# Patient Record
Sex: Female | Born: 1960 | Hispanic: No | Marital: Single
Health system: Southern US, Academic
[De-identification: ages and names within clinical notes are randomized; demographics above are authoritative.]

## PROBLEM LIST (undated history)

## (undated) ENCOUNTER — Ambulatory Visit

## (undated) ENCOUNTER — Encounter

## (undated) ENCOUNTER — Ambulatory Visit: Payer: MEDICARE

## (undated) ENCOUNTER — Encounter
Attending: Student in an Organized Health Care Education/Training Program | Primary: Student in an Organized Health Care Education/Training Program

## (undated) ENCOUNTER — Telehealth

## (undated) ENCOUNTER — Ambulatory Visit: Payer: Medicare (Managed Care)

## (undated) ENCOUNTER — Ambulatory Visit: Attending: Pharmacist | Primary: Pharmacist

## (undated) ENCOUNTER — Encounter
Payer: MEDICARE | Attending: Student in an Organized Health Care Education/Training Program | Primary: Student in an Organized Health Care Education/Training Program

## (undated) DIAGNOSIS — D869 Sarcoidosis, unspecified: Secondary | ICD-10-CM

## (undated) DIAGNOSIS — G4730 Sleep apnea, unspecified: Secondary | ICD-10-CM

## (undated) DIAGNOSIS — M797 Fibromyalgia: Secondary | ICD-10-CM

## (undated) DIAGNOSIS — E538 Deficiency of other specified B group vitamins: Secondary | ICD-10-CM

## (undated) DIAGNOSIS — M1990 Unspecified osteoarthritis, unspecified site: Secondary | ICD-10-CM

## (undated) DIAGNOSIS — L439 Lichen planus, unspecified: Secondary | ICD-10-CM

## (undated) DIAGNOSIS — K219 Gastro-esophageal reflux disease without esophagitis: Secondary | ICD-10-CM

## (undated) DIAGNOSIS — T4145XA Adverse effect of unspecified anesthetic, initial encounter: Secondary | ICD-10-CM

## (undated) DIAGNOSIS — G4733 Obstructive sleep apnea (adult) (pediatric): Secondary | ICD-10-CM

## (undated) DIAGNOSIS — R519 Headache, unspecified: Secondary | ICD-10-CM

## (undated) DIAGNOSIS — J309 Allergic rhinitis, unspecified: Secondary | ICD-10-CM

## (undated) DIAGNOSIS — T8859XA Other complications of anesthesia, initial encounter: Secondary | ICD-10-CM

## (undated) DIAGNOSIS — G459 Transient cerebral ischemic attack, unspecified: Secondary | ICD-10-CM

## (undated) DIAGNOSIS — E46 Unspecified protein-calorie malnutrition: Secondary | ICD-10-CM

## (undated) DIAGNOSIS — R410 Disorientation, unspecified: Secondary | ICD-10-CM

## (undated) DIAGNOSIS — J189 Pneumonia, unspecified organism: Secondary | ICD-10-CM

## (undated) DIAGNOSIS — G4700 Insomnia, unspecified: Secondary | ICD-10-CM

## (undated) DIAGNOSIS — F411 Generalized anxiety disorder: Secondary | ICD-10-CM

## (undated) DIAGNOSIS — M25562 Pain in left knee: Secondary | ICD-10-CM

## (undated) DIAGNOSIS — R51 Headache: Secondary | ICD-10-CM

## (undated) DIAGNOSIS — M069 Rheumatoid arthritis, unspecified: Secondary | ICD-10-CM

## (undated) DIAGNOSIS — M2550 Pain in unspecified joint: Secondary | ICD-10-CM

## (undated) DIAGNOSIS — R2681 Unsteadiness on feet: Secondary | ICD-10-CM

## (undated) DIAGNOSIS — F329 Major depressive disorder, single episode, unspecified: Secondary | ICD-10-CM

## (undated) DIAGNOSIS — F410 Panic disorder [episodic paroxysmal anxiety] without agoraphobia: Secondary | ICD-10-CM

## (undated) DIAGNOSIS — Z9889 Other specified postprocedural states: Secondary | ICD-10-CM

## (undated) DIAGNOSIS — F419 Anxiety disorder, unspecified: Secondary | ICD-10-CM

## (undated) DIAGNOSIS — Z8619 Personal history of other infectious and parasitic diseases: Secondary | ICD-10-CM

## (undated) DIAGNOSIS — I10 Essential (primary) hypertension: Secondary | ICD-10-CM

## (undated) DIAGNOSIS — M2540 Effusion, unspecified joint: Secondary | ICD-10-CM

## (undated) DIAGNOSIS — R0602 Shortness of breath: Secondary | ICD-10-CM

## (undated) DIAGNOSIS — F32A Depression, unspecified: Secondary | ICD-10-CM

## (undated) DIAGNOSIS — R3915 Urgency of urination: Secondary | ICD-10-CM

## (undated) DIAGNOSIS — E559 Vitamin D deficiency, unspecified: Secondary | ICD-10-CM

## (undated) DIAGNOSIS — R413 Other amnesia: Secondary | ICD-10-CM

## (undated) DIAGNOSIS — I639 Cerebral infarction, unspecified: Secondary | ICD-10-CM

## (undated) DIAGNOSIS — E119 Type 2 diabetes mellitus without complications: Secondary | ICD-10-CM

## (undated) DIAGNOSIS — R112 Nausea with vomiting, unspecified: Secondary | ICD-10-CM

## (undated) DIAGNOSIS — R351 Nocturia: Secondary | ICD-10-CM

## (undated) HISTORY — DX: Sarcoidosis, unspecified: D86.9

## (undated) HISTORY — DX: Gastro-esophageal reflux disease without esophagitis: K21.9

## (undated) HISTORY — DX: Essential (primary) hypertension: I10

## (undated) HISTORY — DX: Major depressive disorder, single episode, unspecified: F32.9

## (undated) HISTORY — DX: Pain in left knee: M25.562

## (undated) HISTORY — DX: Unsteadiness on feet: R26.81

## (undated) HISTORY — DX: Anxiety disorder, unspecified: F41.9

## (undated) HISTORY — DX: Generalized anxiety disorder: F41.1

## (undated) HISTORY — DX: Deficiency of other specified B group vitamins: E53.8

## (undated) HISTORY — PX: ENDOMETRIAL ABLATION: SHX621

## (undated) HISTORY — DX: Vitamin D deficiency, unspecified: E55.9

## (undated) HISTORY — DX: Obstructive sleep apnea (adult) (pediatric): G47.33

## (undated) HISTORY — DX: Unspecified osteoarthritis, unspecified site: M19.90

## (undated) HISTORY — DX: Unspecified protein-calorie malnutrition: E46

## (undated) HISTORY — PX: OTHER SURGICAL HISTORY: SHX169

## (undated) HISTORY — DX: Shortness of breath: R06.02

## (undated) HISTORY — PX: APPENDECTOMY: SHX54

## (undated) HISTORY — DX: Allergic rhinitis, unspecified: J30.9

## (undated) HISTORY — PX: AXILLARY ABCESS IRRIGATION AND DEBRIDEMENT: SHX1210

## (undated) HISTORY — DX: Depression, unspecified: F32.A

## (undated) HISTORY — DX: Sleep apnea, unspecified: G47.30

## (undated) SURGERY — COLONOSCOPY
Anesthesia: Monitor Anesthesia Care

## (undated) SURGERY — DACRYOCYSTORHINOSTOMY
Anesthesia: General | Laterality: Bilateral

---
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
## 2002-05-17 ENCOUNTER — Encounter: Payer: Self-pay | Admitting: Internal Medicine

## 2002-05-17 ENCOUNTER — Ambulatory Visit (HOSPITAL_COMMUNITY): Admission: RE | Admit: 2002-05-17 | Discharge: 2002-05-17 | Payer: Self-pay | Admitting: Internal Medicine

## 2003-04-04 ENCOUNTER — Ambulatory Visit (HOSPITAL_COMMUNITY): Admission: RE | Admit: 2003-04-04 | Discharge: 2003-04-04 | Payer: Self-pay | Admitting: Internal Medicine

## 2003-05-05 ENCOUNTER — Ambulatory Visit (HOSPITAL_COMMUNITY): Admission: RE | Admit: 2003-05-05 | Discharge: 2003-05-05 | Payer: Self-pay | Admitting: Internal Medicine

## 2003-09-22 ENCOUNTER — Ambulatory Visit: Payer: Self-pay | Admitting: *Deleted

## 2003-10-07 ENCOUNTER — Ambulatory Visit: Payer: Self-pay | Admitting: Internal Medicine

## 2003-10-11 ENCOUNTER — Ambulatory Visit (HOSPITAL_COMMUNITY): Admission: RE | Admit: 2003-10-11 | Discharge: 2003-10-11 | Payer: Self-pay | Admitting: Internal Medicine

## 2003-10-12 ENCOUNTER — Ambulatory Visit: Payer: Self-pay | Admitting: Internal Medicine

## 2003-11-04 ENCOUNTER — Ambulatory Visit: Payer: Self-pay | Admitting: Internal Medicine

## 2003-11-18 ENCOUNTER — Ambulatory Visit: Payer: Self-pay | Admitting: Internal Medicine

## 2003-12-14 ENCOUNTER — Ambulatory Visit: Payer: Self-pay | Admitting: Internal Medicine

## 2004-01-27 ENCOUNTER — Ambulatory Visit: Payer: Self-pay | Admitting: Internal Medicine

## 2004-02-04 ENCOUNTER — Ambulatory Visit (HOSPITAL_COMMUNITY): Admission: RE | Admit: 2004-02-04 | Discharge: 2004-02-04 | Payer: Self-pay | Admitting: Internal Medicine

## 2004-02-07 ENCOUNTER — Ambulatory Visit: Payer: Self-pay | Admitting: Family Medicine

## 2004-02-16 ENCOUNTER — Ambulatory Visit: Payer: Self-pay | Admitting: Internal Medicine

## 2004-03-19 ENCOUNTER — Ambulatory Visit: Payer: Self-pay | Admitting: Internal Medicine

## 2004-03-26 ENCOUNTER — Ambulatory Visit: Payer: Self-pay | Admitting: Internal Medicine

## 2004-03-26 ENCOUNTER — Ambulatory Visit (HOSPITAL_COMMUNITY): Admission: RE | Admit: 2004-03-26 | Discharge: 2004-03-26 | Payer: Self-pay | Admitting: Internal Medicine

## 2004-04-09 ENCOUNTER — Ambulatory Visit: Payer: Self-pay | Admitting: Family Medicine

## 2004-04-11 ENCOUNTER — Ambulatory Visit: Payer: Self-pay | Admitting: Internal Medicine

## 2004-04-13 ENCOUNTER — Ambulatory Visit: Payer: Self-pay | Admitting: Internal Medicine

## 2004-05-07 ENCOUNTER — Ambulatory Visit: Payer: Self-pay | Admitting: Internal Medicine

## 2004-07-04 ENCOUNTER — Ambulatory Visit: Payer: Self-pay | Admitting: Internal Medicine

## 2004-09-05 ENCOUNTER — Ambulatory Visit: Payer: Self-pay | Admitting: Internal Medicine

## 2004-10-10 ENCOUNTER — Ambulatory Visit: Payer: Self-pay | Admitting: Internal Medicine

## 2004-10-12 ENCOUNTER — Ambulatory Visit (HOSPITAL_COMMUNITY): Admission: RE | Admit: 2004-10-12 | Discharge: 2004-10-12 | Payer: Self-pay | Admitting: Internal Medicine

## 2004-10-23 ENCOUNTER — Ambulatory Visit: Payer: Self-pay | Admitting: Internal Medicine

## 2004-11-07 ENCOUNTER — Ambulatory Visit: Payer: Self-pay | Admitting: Internal Medicine

## 2004-11-12 ENCOUNTER — Ambulatory Visit: Payer: Self-pay | Admitting: Internal Medicine

## 2004-11-12 HISTORY — PX: OTHER SURGICAL HISTORY: SHX169

## 2004-11-22 ENCOUNTER — Ambulatory Visit: Payer: Self-pay | Admitting: Pulmonary Disease

## 2004-11-28 ENCOUNTER — Ambulatory Visit: Payer: Self-pay | Admitting: Pulmonary Disease

## 2004-11-28 ENCOUNTER — Ambulatory Visit: Admission: RE | Admit: 2004-11-28 | Discharge: 2004-11-28 | Payer: Self-pay | Admitting: Pulmonary Disease

## 2004-12-16 ENCOUNTER — Ambulatory Visit (HOSPITAL_BASED_OUTPATIENT_CLINIC_OR_DEPARTMENT_OTHER): Admission: RE | Admit: 2004-12-16 | Discharge: 2004-12-16 | Payer: Self-pay | Admitting: Pulmonary Disease

## 2004-12-24 ENCOUNTER — Ambulatory Visit: Payer: Self-pay | Admitting: Internal Medicine

## 2005-01-01 ENCOUNTER — Ambulatory Visit: Payer: Self-pay | Admitting: Pulmonary Disease

## 2005-02-20 ENCOUNTER — Ambulatory Visit: Payer: Self-pay | Admitting: Pulmonary Disease

## 2005-03-07 ENCOUNTER — Ambulatory Visit: Payer: Self-pay | Admitting: Pulmonary Disease

## 2005-03-25 ENCOUNTER — Ambulatory Visit: Payer: Self-pay | Admitting: Internal Medicine

## 2005-04-04 ENCOUNTER — Encounter: Admission: RE | Admit: 2005-04-04 | Discharge: 2005-04-04 | Payer: Self-pay | Admitting: Internal Medicine

## 2005-05-24 ENCOUNTER — Ambulatory Visit: Payer: Self-pay | Admitting: Internal Medicine

## 2005-07-05 ENCOUNTER — Emergency Department (HOSPITAL_COMMUNITY): Admission: EM | Admit: 2005-07-05 | Discharge: 2005-07-05 | Payer: Self-pay | Admitting: Emergency Medicine

## 2005-07-05 ENCOUNTER — Ambulatory Visit: Payer: Self-pay | Admitting: Internal Medicine

## 2005-07-11 ENCOUNTER — Ambulatory Visit: Payer: Self-pay | Admitting: Internal Medicine

## 2005-07-24 ENCOUNTER — Ambulatory Visit: Payer: Self-pay | Admitting: Internal Medicine

## 2005-07-26 ENCOUNTER — Ambulatory Visit: Payer: Self-pay | Admitting: Internal Medicine

## 2005-09-20 ENCOUNTER — Ambulatory Visit: Payer: Self-pay | Admitting: Pulmonary Disease

## 2005-10-10 ENCOUNTER — Ambulatory Visit: Payer: Self-pay | Admitting: Internal Medicine

## 2005-10-17 ENCOUNTER — Ambulatory Visit: Payer: Self-pay | Admitting: Professional

## 2005-10-31 ENCOUNTER — Ambulatory Visit: Payer: Self-pay | Admitting: Professional

## 2005-11-11 ENCOUNTER — Ambulatory Visit: Payer: Self-pay | Admitting: Internal Medicine

## 2005-11-18 ENCOUNTER — Ambulatory Visit: Payer: Self-pay | Admitting: Professional

## 2005-12-09 ENCOUNTER — Ambulatory Visit: Payer: Self-pay | Admitting: Professional

## 2005-12-13 ENCOUNTER — Ambulatory Visit: Payer: Self-pay | Admitting: Internal Medicine

## 2005-12-23 ENCOUNTER — Ambulatory Visit (HOSPITAL_COMMUNITY): Admission: RE | Admit: 2005-12-23 | Discharge: 2005-12-23 | Payer: Self-pay | Admitting: Obstetrics & Gynecology

## 2006-03-14 ENCOUNTER — Emergency Department (HOSPITAL_COMMUNITY): Admission: EM | Admit: 2006-03-14 | Discharge: 2006-03-14 | Payer: Self-pay | Admitting: Emergency Medicine

## 2006-03-17 ENCOUNTER — Ambulatory Visit: Payer: Self-pay | Admitting: Internal Medicine

## 2006-03-20 ENCOUNTER — Ambulatory Visit: Payer: Self-pay | Admitting: Professional

## 2006-03-24 ENCOUNTER — Ambulatory Visit: Payer: Self-pay | Admitting: Professional

## 2006-03-31 ENCOUNTER — Ambulatory Visit: Payer: Self-pay | Admitting: Professional

## 2006-04-10 ENCOUNTER — Ambulatory Visit: Payer: Self-pay | Admitting: Professional

## 2006-05-01 ENCOUNTER — Ambulatory Visit: Payer: Self-pay | Admitting: Professional

## 2006-05-02 ENCOUNTER — Ambulatory Visit: Payer: Self-pay | Admitting: Internal Medicine

## 2006-05-02 LAB — CONVERTED CEMR LAB
Basophils Absolute: 0 10*3/uL (ref 0.0–0.1)
Chloride: 104 meq/L (ref 96–112)
Eosinophils Absolute: 0.1 10*3/uL (ref 0.0–0.6)
GFR calc Af Amer: 77 mL/min
GFR calc non Af Amer: 64 mL/min
Glucose, Bld: 105 mg/dL — ABNORMAL HIGH (ref 70–99)
Hemoglobin, Urine: NEGATIVE
Ketones, ur: NEGATIVE mg/dL
MCHC: 35.4 g/dL (ref 30.0–36.0)
MCV: 93.5 fL (ref 78.0–100.0)
Monocytes Relative: 7.5 % (ref 3.0–11.0)
Nitrite: NEGATIVE
Platelets: 260 10*3/uL (ref 150–400)
RBC: 4.6 M/uL (ref 3.87–5.11)
RDW: 12.7 % (ref 11.5–14.6)
Sodium: 145 meq/L (ref 135–145)
Specific Gravity, Urine: 1.025 (ref 1.000–1.03)
TSH: 1.81 microintl units/mL (ref 0.35–5.50)
Total Protein, Urine: 30 mg/dL — AB
pH: 6.5 (ref 5.0–8.0)

## 2006-05-27 ENCOUNTER — Ambulatory Visit: Payer: Self-pay | Admitting: Internal Medicine

## 2006-05-27 LAB — CONVERTED CEMR LAB
Hemoglobin, Urine: NEGATIVE
Urine Glucose: NEGATIVE mg/dL
pH: 6 (ref 5.0–8.0)

## 2006-06-05 ENCOUNTER — Ambulatory Visit (HOSPITAL_COMMUNITY): Admission: RE | Admit: 2006-06-05 | Discharge: 2006-06-05 | Payer: Self-pay | Admitting: Orthopedic Surgery

## 2006-08-06 ENCOUNTER — Ambulatory Visit: Payer: Self-pay | Admitting: Internal Medicine

## 2006-08-06 LAB — CONVERTED CEMR LAB
CO2: 29 meq/L (ref 19–32)
Chloride: 102 meq/L (ref 96–112)
Creatinine, Ser: 0.8 mg/dL (ref 0.4–1.2)
Glucose, Bld: 114 mg/dL — ABNORMAL HIGH (ref 70–99)
Potassium: 4.4 meq/L (ref 3.5–5.1)
Sodium: 139 meq/L (ref 135–145)

## 2006-08-14 DIAGNOSIS — K219 Gastro-esophageal reflux disease without esophagitis: Secondary | ICD-10-CM | POA: Insufficient documentation

## 2006-08-14 DIAGNOSIS — F411 Generalized anxiety disorder: Secondary | ICD-10-CM

## 2006-08-14 DIAGNOSIS — F339 Major depressive disorder, recurrent, unspecified: Secondary | ICD-10-CM | POA: Insufficient documentation

## 2006-08-14 DIAGNOSIS — D869 Sarcoidosis, unspecified: Secondary | ICD-10-CM | POA: Insufficient documentation

## 2006-08-14 HISTORY — DX: Generalized anxiety disorder: F41.1

## 2006-08-22 ENCOUNTER — Ambulatory Visit: Payer: Self-pay | Admitting: Internal Medicine

## 2006-09-11 ENCOUNTER — Ambulatory Visit: Payer: Self-pay | Admitting: Pulmonary Disease

## 2006-09-11 LAB — CONVERTED CEMR LAB
Angiotensin 1 Converting Enzyme: 31 units/L (ref 9–67)
BUN: 15 mg/dL (ref 6–23)

## 2006-09-17 ENCOUNTER — Ambulatory Visit: Payer: Self-pay | Admitting: Internal Medicine

## 2006-09-17 ENCOUNTER — Encounter: Payer: Self-pay | Admitting: Pulmonary Disease

## 2006-09-19 ENCOUNTER — Ambulatory Visit: Admission: RE | Admit: 2006-09-19 | Discharge: 2006-09-19 | Payer: Self-pay | Admitting: Pulmonary Disease

## 2006-09-19 ENCOUNTER — Encounter: Payer: Self-pay | Admitting: Pulmonary Disease

## 2006-10-07 ENCOUNTER — Ambulatory Visit: Payer: Self-pay | Admitting: Pulmonary Disease

## 2006-10-16 ENCOUNTER — Encounter: Admission: RE | Admit: 2006-10-16 | Discharge: 2006-10-16 | Payer: Self-pay | Admitting: Internal Medicine

## 2006-10-22 ENCOUNTER — Ambulatory Visit: Payer: Self-pay | Admitting: Pulmonary Disease

## 2006-10-22 LAB — CONVERTED CEMR LAB
Basophils Absolute: 0 10*3/uL (ref 0.0–0.1)
Basophils Relative: 0.4 % (ref 0.0–1.0)
HCT: 39.3 % (ref 36.0–46.0)
INR: 0.8 (ref 0.8–1.0)
MCHC: 35.2 g/dL (ref 30.0–36.0)
Neutrophils Relative %: 84.5 % — ABNORMAL HIGH (ref 43.0–77.0)
Prothrombin Time: 10.5 s — ABNORMAL LOW (ref 10.9–13.3)
RBC: 4.1 M/uL (ref 3.87–5.11)
RDW: 12.7 % (ref 11.5–14.6)
WBC: 10.9 10*3/uL — ABNORMAL HIGH (ref 4.5–10.5)

## 2006-10-29 ENCOUNTER — Encounter: Payer: Self-pay | Admitting: Pulmonary Disease

## 2006-10-29 ENCOUNTER — Ambulatory Visit: Payer: Self-pay

## 2006-11-05 ENCOUNTER — Ambulatory Visit: Payer: Self-pay | Admitting: Pulmonary Disease

## 2006-12-03 ENCOUNTER — Ambulatory Visit: Payer: Self-pay | Admitting: Pulmonary Disease

## 2006-12-04 ENCOUNTER — Encounter: Payer: Self-pay | Admitting: Internal Medicine

## 2007-01-14 ENCOUNTER — Ambulatory Visit: Payer: Self-pay | Admitting: Pulmonary Disease

## 2007-01-21 ENCOUNTER — Encounter: Payer: Self-pay | Admitting: Internal Medicine

## 2007-02-02 ENCOUNTER — Ambulatory Visit: Payer: Self-pay | Admitting: Internal Medicine

## 2007-02-02 DIAGNOSIS — M1990 Unspecified osteoarthritis, unspecified site: Secondary | ICD-10-CM | POA: Insufficient documentation

## 2007-02-02 DIAGNOSIS — J019 Acute sinusitis, unspecified: Secondary | ICD-10-CM | POA: Insufficient documentation

## 2007-02-04 ENCOUNTER — Emergency Department (HOSPITAL_COMMUNITY): Admission: EM | Admit: 2007-02-04 | Discharge: 2007-02-04 | Payer: Self-pay | Admitting: Emergency Medicine

## 2007-02-06 ENCOUNTER — Telehealth: Payer: Self-pay | Admitting: Internal Medicine

## 2007-02-07 ENCOUNTER — Ambulatory Visit: Payer: Self-pay | Admitting: Internal Medicine

## 2007-02-07 ENCOUNTER — Inpatient Hospital Stay (HOSPITAL_COMMUNITY): Admission: EM | Admit: 2007-02-07 | Discharge: 2007-02-13 | Payer: Self-pay | Admitting: Emergency Medicine

## 2007-02-07 ENCOUNTER — Ambulatory Visit: Payer: Self-pay | Admitting: Family Medicine

## 2007-02-07 ENCOUNTER — Encounter (INDEPENDENT_AMBULATORY_CARE_PROVIDER_SITE_OTHER): Payer: Self-pay | Admitting: Surgery

## 2007-02-07 DIAGNOSIS — R1031 Right lower quadrant pain: Secondary | ICD-10-CM

## 2007-02-07 DIAGNOSIS — N390 Urinary tract infection, site not specified: Secondary | ICD-10-CM | POA: Insufficient documentation

## 2007-02-10 ENCOUNTER — Encounter: Payer: Self-pay | Admitting: Internal Medicine

## 2007-02-18 ENCOUNTER — Encounter: Payer: Self-pay | Admitting: Internal Medicine

## 2007-03-24 ENCOUNTER — Ambulatory Visit: Payer: Self-pay | Admitting: Pulmonary Disease

## 2007-04-07 ENCOUNTER — Encounter: Payer: Self-pay | Admitting: Internal Medicine

## 2007-04-28 ENCOUNTER — Ambulatory Visit: Payer: Self-pay | Admitting: Internal Medicine

## 2007-04-28 LAB — CONVERTED CEMR LAB
ALT: 25 units/L (ref 0–35)
AST: 19 units/L (ref 0–37)
Albumin: 3 g/dL — ABNORMAL LOW (ref 3.5–5.2)
BUN: 14 mg/dL (ref 6–23)
Basophils Relative: 0.1 % (ref 0.0–1.0)
CO2: 31 meq/L (ref 19–32)
Chloride: 104 meq/L (ref 96–112)
Creatinine, Ser: 0.9 mg/dL (ref 0.4–1.2)
Eosinophils Relative: 0.5 % (ref 0.0–5.0)
Hemoglobin: 12.5 g/dL (ref 12.0–15.0)
Hgb A1c MFr Bld: 5.9 % (ref 4.6–6.0)
Lymphocytes Relative: 16 % (ref 12.0–46.0)
MCV: 94.8 fL (ref 78.0–100.0)
Neutrophils Relative %: 79.4 % — ABNORMAL HIGH (ref 43.0–77.0)
RBC: 4.09 M/uL (ref 3.87–5.11)
Total Protein: 6.4 g/dL (ref 6.0–8.3)
WBC: 6.3 10*3/uL (ref 4.5–10.5)

## 2007-05-05 ENCOUNTER — Ambulatory Visit: Payer: Self-pay | Admitting: Internal Medicine

## 2007-05-11 ENCOUNTER — Encounter: Payer: Self-pay | Admitting: Internal Medicine

## 2007-06-01 ENCOUNTER — Encounter: Payer: Self-pay | Admitting: Pulmonary Disease

## 2007-06-15 ENCOUNTER — Ambulatory Visit: Payer: Self-pay | Admitting: Internal Medicine

## 2007-07-01 ENCOUNTER — Telehealth: Payer: Self-pay | Admitting: Internal Medicine

## 2007-07-02 ENCOUNTER — Ambulatory Visit: Payer: Self-pay | Admitting: Internal Medicine

## 2007-07-02 ENCOUNTER — Telehealth: Payer: Self-pay | Admitting: Internal Medicine

## 2007-07-02 ENCOUNTER — Encounter: Payer: Self-pay | Admitting: Internal Medicine

## 2007-07-02 DIAGNOSIS — H5310 Unspecified subjective visual disturbances: Secondary | ICD-10-CM | POA: Insufficient documentation

## 2007-07-06 ENCOUNTER — Telehealth: Payer: Self-pay | Admitting: Internal Medicine

## 2007-07-10 ENCOUNTER — Ambulatory Visit: Payer: Self-pay | Admitting: Internal Medicine

## 2007-07-10 DIAGNOSIS — R030 Elevated blood-pressure reading, without diagnosis of hypertension: Secondary | ICD-10-CM | POA: Insufficient documentation

## 2007-07-16 ENCOUNTER — Encounter: Payer: Self-pay | Admitting: Internal Medicine

## 2007-08-05 ENCOUNTER — Encounter: Payer: Self-pay | Admitting: Internal Medicine

## 2007-08-13 ENCOUNTER — Encounter: Payer: Self-pay | Admitting: Internal Medicine

## 2007-08-19 ENCOUNTER — Ambulatory Visit: Payer: Self-pay | Admitting: Internal Medicine

## 2007-08-20 LAB — CONVERTED CEMR LAB
Alkaline Phosphatase: 68 units/L (ref 39–117)
Bilirubin, Direct: 0.1 mg/dL (ref 0.0–0.3)
CO2: 30 meq/L (ref 19–32)
GFR calc Af Amer: 99 mL/min
Glucose, Bld: 101 mg/dL — ABNORMAL HIGH (ref 70–99)
Lymphocytes Relative: 22.6 % (ref 12.0–46.0)
Monocytes Absolute: 0.6 10*3/uL (ref 0.1–1.0)
Monocytes Relative: 7.8 % (ref 3.0–12.0)
Platelets: 216 10*3/uL (ref 150–400)
Potassium: 3.6 meq/L (ref 3.5–5.1)
RDW: 13.5 % (ref 11.5–14.6)
Sodium: 140 meq/L (ref 135–145)
Total Protein: 6 g/dL (ref 6.0–8.3)
Vitamin B-12: 216 pg/mL (ref 211–911)

## 2007-08-25 ENCOUNTER — Ambulatory Visit: Payer: Self-pay | Admitting: Internal Medicine

## 2007-08-25 DIAGNOSIS — E538 Deficiency of other specified B group vitamins: Secondary | ICD-10-CM

## 2007-08-25 DIAGNOSIS — E559 Vitamin D deficiency, unspecified: Secondary | ICD-10-CM

## 2007-08-25 HISTORY — DX: Vitamin D deficiency, unspecified: E55.9

## 2007-08-25 HISTORY — DX: Deficiency of other specified B group vitamins: E53.8

## 2007-08-27 DIAGNOSIS — I10 Essential (primary) hypertension: Secondary | ICD-10-CM

## 2007-09-25 ENCOUNTER — Telehealth: Payer: Self-pay | Admitting: Internal Medicine

## 2007-10-14 ENCOUNTER — Ambulatory Visit: Payer: Self-pay | Admitting: Internal Medicine

## 2007-10-26 ENCOUNTER — Encounter: Payer: Self-pay | Admitting: Internal Medicine

## 2007-10-28 ENCOUNTER — Encounter: Payer: Self-pay | Admitting: Internal Medicine

## 2007-11-16 ENCOUNTER — Ambulatory Visit: Payer: Self-pay | Admitting: Internal Medicine

## 2007-11-27 ENCOUNTER — Telehealth: Payer: Self-pay | Admitting: Internal Medicine

## 2007-12-24 ENCOUNTER — Telehealth: Payer: Self-pay | Admitting: Internal Medicine

## 2007-12-24 ENCOUNTER — Ambulatory Visit: Payer: Self-pay | Admitting: Internal Medicine

## 2007-12-25 LAB — CONVERTED CEMR LAB
ALT: 20 units/L (ref 0–35)
AST: 16 units/L (ref 0–37)
BUN: 14 mg/dL (ref 6–23)
CO2: 31 meq/L (ref 19–32)
Chloride: 102 meq/L (ref 96–112)
Creatinine, Ser: 0.9 mg/dL (ref 0.4–1.2)
Glucose, Bld: 123 mg/dL — ABNORMAL HIGH (ref 70–99)
Total Bilirubin: 0.6 mg/dL (ref 0.3–1.2)
Total Protein: 6.5 g/dL (ref 6.0–8.3)
Vitamin B-12: >1500 pg/mL — ABNORMAL HIGH (ref 211–911)

## 2008-01-18 ENCOUNTER — Telehealth: Payer: Self-pay | Admitting: Internal Medicine

## 2008-03-04 ENCOUNTER — Ambulatory Visit: Payer: Self-pay | Admitting: Internal Medicine

## 2008-06-03 ENCOUNTER — Telehealth: Payer: Self-pay | Admitting: Internal Medicine

## 2008-06-29 ENCOUNTER — Ambulatory Visit: Payer: Self-pay | Admitting: Internal Medicine

## 2008-06-29 LAB — CONVERTED CEMR LAB
ALT: 20 units/L (ref 0–35)
AST: 19 units/L (ref 0–37)
Albumin: 3.1 g/dL — ABNORMAL LOW (ref 3.5–5.2)
BUN: 12 mg/dL (ref 6–23)
Cholesterol: 193 mg/dL (ref 0–200)
Creatinine, Ser: 0.9 mg/dL (ref 0.4–1.2)
GFR calc non Af Amer: 71.05 mL/min (ref >60)
Glucose, Bld: 94 mg/dL (ref 70–99)
Hgb A1c MFr Bld: 5.6 % (ref 4.6–6.5)
Microalb Creat Ratio: 1.3 mg/g (ref 0.0–30.0)
Microalb, Ur: 0.4 mg/dL (ref 0.0–1.9)
Potassium: 3.7 meq/L (ref 3.5–5.1)
Vit D, 25-Hydroxy: 30 ng/mL (ref 30–89)

## 2008-07-05 ENCOUNTER — Ambulatory Visit: Payer: Self-pay | Admitting: Internal Medicine

## 2008-09-28 ENCOUNTER — Ambulatory Visit: Payer: Self-pay | Admitting: Internal Medicine

## 2008-09-28 LAB — CONVERTED CEMR LAB
Calcium: 8.9 mg/dL (ref 8.4–10.5)
Creatinine, Ser: 0.9 mg/dL (ref 0.4–1.2)
GFR calc non Af Amer: 70.98 mL/min (ref >60)

## 2008-10-05 ENCOUNTER — Ambulatory Visit: Payer: Self-pay | Admitting: Internal Medicine

## 2008-10-05 DIAGNOSIS — J320 Chronic maxillary sinusitis: Secondary | ICD-10-CM

## 2008-12-26 ENCOUNTER — Ambulatory Visit: Payer: Self-pay | Admitting: Internal Medicine

## 2008-12-27 LAB — CONVERTED CEMR LAB
AST: 26 units/L (ref 0–37)
Alkaline Phosphatase: 63 units/L (ref 39–117)
Basophils Absolute: 0 10*3/uL (ref 0.0–0.1)
GFR calc non Af Amer: 56.25 mL/min (ref >60)
HCT: 42 % (ref 36.0–46.0)
Hemoglobin: 14.3 g/dL (ref 12.0–15.0)
Lymphs Abs: 0.6 10*3/uL — ABNORMAL LOW (ref 0.7–4.0)
MCHC: 34 g/dL (ref 30.0–36.0)
Monocytes Relative: 0.8 % — ABNORMAL LOW (ref 3.0–12.0)
Neutro Abs: 11.2 10*3/uL — ABNORMAL HIGH (ref 1.4–7.7)
Potassium: 3.7 meq/L (ref 3.5–5.1)
RDW: 12 % (ref 11.5–14.6)
Sodium: 140 meq/L (ref 135–145)
TSH: 1.06 microintl units/mL (ref 0.35–5.50)
Total Bilirubin: 0.6 mg/dL (ref 0.3–1.2)

## 2008-12-30 ENCOUNTER — Ambulatory Visit: Payer: Self-pay | Admitting: Internal Medicine

## 2009-03-07 ENCOUNTER — Emergency Department (HOSPITAL_COMMUNITY): Admission: EM | Admit: 2009-03-07 | Discharge: 2009-03-07 | Payer: Self-pay | Admitting: Emergency Medicine

## 2009-03-30 ENCOUNTER — Telehealth: Payer: Self-pay | Admitting: Internal Medicine

## 2009-04-24 ENCOUNTER — Ambulatory Visit: Payer: Self-pay | Admitting: Internal Medicine

## 2009-04-24 LAB — CONVERTED CEMR LAB
ALT: 38 units/L — ABNORMAL HIGH (ref 0–35)
Albumin: 3.1 g/dL — ABNORMAL LOW (ref 3.5–5.2)
BUN: 16 mg/dL (ref 6–23)
Chloride: 100 meq/L (ref 96–112)
Creatinine, Ser: 1.1 mg/dL (ref 0.4–1.2)
Glucose, Bld: 101 mg/dL — ABNORMAL HIGH (ref 70–99)
Hgb A1c MFr Bld: 5.8 % (ref 4.6–6.5)
Total Bilirubin: 0.6 mg/dL (ref 0.3–1.2)
Total Protein: 6.8 g/dL (ref 6.0–8.3)
Vitamin B-12: 1345 pg/mL — ABNORMAL HIGH (ref 211–911)

## 2009-04-28 ENCOUNTER — Ambulatory Visit: Payer: Self-pay | Admitting: Internal Medicine

## 2009-04-28 DIAGNOSIS — R0602 Shortness of breath: Secondary | ICD-10-CM

## 2009-04-28 HISTORY — DX: Shortness of breath: R06.02

## 2009-05-01 ENCOUNTER — Encounter: Payer: Self-pay | Admitting: Internal Medicine

## 2009-05-03 ENCOUNTER — Encounter: Payer: Self-pay | Admitting: Internal Medicine

## 2009-05-03 ENCOUNTER — Telehealth: Payer: Self-pay | Admitting: Internal Medicine

## 2009-06-07 ENCOUNTER — Ambulatory Visit: Payer: Self-pay | Admitting: Internal Medicine

## 2009-07-03 ENCOUNTER — Telehealth: Payer: Self-pay | Admitting: Internal Medicine

## 2009-07-04 ENCOUNTER — Encounter: Payer: Self-pay | Admitting: Internal Medicine

## 2009-07-04 ENCOUNTER — Telehealth: Payer: Self-pay | Admitting: Internal Medicine

## 2009-07-20 ENCOUNTER — Telehealth (INDEPENDENT_AMBULATORY_CARE_PROVIDER_SITE_OTHER): Payer: Self-pay | Admitting: *Deleted

## 2009-07-26 ENCOUNTER — Telehealth: Payer: Self-pay | Admitting: Internal Medicine

## 2009-08-01 ENCOUNTER — Ambulatory Visit: Payer: Self-pay | Admitting: Internal Medicine

## 2009-08-01 ENCOUNTER — Encounter: Payer: Self-pay | Admitting: Internal Medicine

## 2009-08-01 DIAGNOSIS — F4321 Adjustment disorder with depressed mood: Secondary | ICD-10-CM | POA: Insufficient documentation

## 2009-08-14 LAB — CONVERTED CEMR LAB
AST: 24 units/L (ref 0–37)
Alkaline Phosphatase: 50 units/L (ref 39–117)
Calcium: 8.7 mg/dL (ref 8.4–10.5)
Eosinophils Absolute: 0 10*3/uL (ref 0.0–0.7)
GFR calc non Af Amer: 59.19 mL/min (ref >60)
Ketones, ur: NEGATIVE mg/dL
Leukocytes, UA: NEGATIVE
MCHC: 34.1 g/dL (ref 30.0–36.0)
MCV: 95.8 fL (ref 78.0–100.0)
Monocytes Absolute: 0.2 10*3/uL (ref 0.1–1.0)
Neutrophils Relative %: 88.4 % — ABNORMAL HIGH (ref 43.0–77.0)
Platelets: 247 10*3/uL (ref 150.0–400.0)
Potassium: 4.1 meq/L (ref 3.5–5.1)
RDW: 13.8 % (ref 11.5–14.6)
Sodium: 139 meq/L (ref 135–145)
Specific Gravity, Urine: 1.015 (ref 1.000–1.030)
TSH: 0.48 microintl units/mL (ref 0.35–5.50)
Total Bilirubin: 0.6 mg/dL (ref 0.3–1.2)
Urine Glucose: NEGATIVE mg/dL
pH: 8.5 (ref 5.0–8.0)

## 2009-08-21 ENCOUNTER — Encounter: Payer: Self-pay | Admitting: Internal Medicine

## 2009-08-28 ENCOUNTER — Telehealth: Payer: Self-pay | Admitting: Internal Medicine

## 2009-08-29 ENCOUNTER — Telehealth: Payer: Self-pay | Admitting: Internal Medicine

## 2009-09-04 ENCOUNTER — Encounter: Payer: Self-pay | Admitting: Internal Medicine

## 2009-10-13 ENCOUNTER — Telehealth: Payer: Self-pay | Admitting: Internal Medicine

## 2009-10-26 ENCOUNTER — Ambulatory Visit: Payer: Self-pay | Admitting: Internal Medicine

## 2009-10-26 DIAGNOSIS — J309 Allergic rhinitis, unspecified: Secondary | ICD-10-CM

## 2009-10-26 HISTORY — DX: Allergic rhinitis, unspecified: J30.9

## 2009-10-30 ENCOUNTER — Telehealth: Payer: Self-pay | Admitting: Internal Medicine

## 2009-11-24 ENCOUNTER — Telehealth: Payer: Self-pay | Admitting: Internal Medicine

## 2010-02-04 ENCOUNTER — Encounter: Payer: Self-pay | Admitting: Internal Medicine

## 2010-02-13 NOTE — Assessment & Plan Note (Signed)
Summary: 3 MO ROV /NWS  # flu shot   Vital Signs:  Patient profile:   50 year old female Height:      67 inches Weight:      298 pounds BMI:     46.84 Temp:     97.9 degrees F oral Pulse rate:   72 / minute Pulse rhythm:   regular Resp:     16 per minute BP sitting:   108 / 82  (left arm) Cuff size:   large  Vitals Entered By: Lanier Prude, CMA(AAMA) (October 26, 2009 11:06 AM) CC: 3 mo f/u Is Patient Diabetic? No   Primary Care Provider:  Plotnikov  CC:  3 mo f/u.  History of Present Illness: The patient presents for a follow up of sarcoid, allergies, GERD   Current Medications (verified): 1)  Celexa 40 Mg  Tabs (Citalopram Hydrobromide) .... Take 1.5  Tablet By Mouth Once A Day 2)  Coreg 12.5 Mg  Tabs (Carvedilol) .... Bid 3)  Buspar 10 Mg Tabs (Buspirone Hcl) .[name].. 1 By Mouth Two Times A Day 4)  Klor-Con 10 10 Meq Tbcr (Potassium Chloride) .... Take 1 Tab By Mouth Daily 5)  Allegra-D 12 Hour 60-120 Mg  Tb12 (Fexofenadine-Pseudoephedrine) .... Take 1 Two Times A Day 6)  Metoclopramide Hcl 10 Mg Tabs (Metoclopramide Hcl) .[name].. 1 By Mouth Three Times A Day Ac 7)  Prednisone 10 Mg  Tabs (Prednisone) .[name].. 1-2  By Mouth Once Daily As Dirrected 8)  Lorazepam 1 Mg  Tabs (Lorazepam) .[name].. 1 By Mouth Two Times A Day As Needed Anxiety or Insomnia 9)  Vitamin B-12 Cr 1000 Mcg  Tbcr (Cyanocobalamin) .... Take One Half Tablet By Mouth Daily 10)  Vitamin D3 1000 Unit  Tabs (Cholecalciferol) .... 2 By Mouth Daily 11)  Triamcinolone Acetonide 0.5 % Crea (Triamcinolone Acetonide) .... Apply Bid To Affected Area 12)  Fexofenadine Hcl 180 Mg Tabs (Fexofenadine Hcl) .[name].. 1 Once Daily As Needed Allergies 13)  Nexium 40 Mg Cpdr (Esomeprazole Magnesium) .... Take 1 Tab Each Morning 14)  Fish Oil 1000 Mg Caps (Omega-3 Fatty Acids) .... Once Daily 15)  Hydrocodone-Acetaminophen 10-325 Mg Tabs (Hydrocodone-Acetaminophen) .[name].. 1 By Mouth Up To 4 Times A Day By Mouth 16)  Aspirin 81 Mg Tbec  (Aspirin) .[name].. 1 By Mouth Qd  Allergies (verified): 1)  Enalapril Maleate 2)  Hydrochlorothiazide 3)  Plaquenil (Hydroxychloroquine Sulfate)  Past History:  Social History: Last updated: 08/25/2007 Occupation: disabled Single, broke up with her partner in 2008 Never Smoked  Past Medical History: 1. Abdominal pain (R low quad) 2. UTI  3. Family Hx of CAD (female) 4. Sinusitis 5. Osteoarthritis 6. Sarcoidosis Dr Wilford Grist 7. GERD  8. Depression 9. Anxiety  10. * Endometrial ablation 11. Vit D def 2009 12. Vit B12 def 2009 13. Ruptured appendix 1/09 14. Hypertension 15. OSA Allergic rhinitis  Review of Systems       The patient complains of dyspnea on exertion and depression.  The patient denies fever, abdominal pain, melena, and hematochezia.    Physical Exam  General:  NAD, overweight-appearing.   Nose:  Erythematous throat mucosa and intranasal erythema.  Mouth:  . Lungs:  Normal respiratory effort, chest expands symmetrically. Lungs are clear to auscultation, no crackles or wheezes. Heart:  Normal rate and regular rhythm. S1 and S2 normal without gallop, murmur, click, rub or other extra sounds. Abdomen:  Bowel sounds positive,abdomen soft and non-tender without masses, organomegaly or hernias noted. Msk:  Lumbar-sacral  spine is not  tender to palpation over paraspinal muscles and less painfull with the ROM  Extremities:  No edema B Neurologic:  No cranial nerve deficits noted. Station and gait are normal. Plantar reflexes are down-going bilaterally. DTRs are symmetrical throughout. Sensory, motor and coordinative functions appear intact. Skin:  Sacoid on face and L ear is  purple, not worse Psych:  normally interactive, good eye contact, and depressed affect.  tearful.     Impression & Recommendations:  Problem # 1:  HYPERTENSION (ICD-401.9) Assessment Unchanged  Her updated medication list for this problem includes:    Coreg 12.5 Mg Tabs (Carvedilol) .....  Bid  Problem # 2:  SARCOIDOSIS (ICD-135) Assessment: Unchanged  Problem # 3:  B12 DEFICIENCY (ICD-266.2)  Problem # 4:  ALLERGIC RHINITIS (ICD-477.9)  Her updated medication list for this problem includes:    Fexofenadine Hcl 180 Mg Tabs (Fexofenadine hcl) .[name]... 1 once daily as needed allergies Assessment: New  Complete Medication List: 1)  Celexa 40 Mg Tabs (Citalopram hydrobromide) .... Take 1.5  tablet by mouth once a day 2)  Coreg 12.5 Mg Tabs (Carvedilol) .... Bid 3)  Buspar 10 Mg Tabs (Buspirone hcl) .[name].. 1 by mouth two times a day 4)  Klor-con 10 10 Meq Tbcr (Potassium chloride) .... Take 1 tab by mouth daily 5)  Allegra-d 12 Hour 60-120 Mg Tb12 (Fexofenadine-pseudoephedrine) .... Take 1 two times a day 6)  Metoclopramide Hcl 10 Mg Tabs (Metoclopramide hcl) .[name].. 1 by mouth three times a day ac 7)  Prednisone 10 Mg Tabs (Prednisone) .[name].. 1-2  by mouth once daily as dirrected 8)  Lorazepam 1 Mg Tabs (Lorazepam) .[name].. 1 by mouth two times a day as needed anxiety or insomnia 9)  Vitamin B-12 Cr 1000 Mcg Tbcr (Cyanocobalamin) .... Take one half tablet by mouth daily 10)  Vitamin D3 1000 Unit Tabs (Cholecalciferol) .... 2 by mouth daily 11)  Triamcinolone Acetonide 0.5 % Crea (Triamcinolone acetonide) .... Apply bid to affected area 12)  Fexofenadine Hcl 180 Mg Tabs (Fexofenadine hcl) .[name].. 1 once daily as needed allergies 13)  Nexium 40 Mg Cpdr (Esomeprazole magnesium) .... Take 1 tab each morning 14)  Fish Oil 1000 Mg Caps (Omega-3 fatty acids) .... Once daily 15)  Hydrocodone-acetaminophen 10-325 Mg Tabs (Hydrocodone-acetaminophen) .[name].. 1 by mouth up to 4 times a day by mouth 16)  Aspirin 81 Mg Tbec (Aspirin) .[name].. 1 by mouth qd  Other Orders: Flu Vaccine 95yrs + MEDICARE PATIENTS (U0454) Administration Flu vaccine - MCR (U9811)  Patient Instructions: 1)  Please schedule a follow-up appointment in 4 months well w/labs. Prescriptions: FEXOFENADINE HCL 180 MG TABS (FEXOFENADINE  HCL) 1 once daily as needed allergies  #90.0 Each x 3   Entered and Authorized by:   Tresa Garter MD   Signed by:   Tresa Garter MD on 10/26/2009   Method used:   Print then Give to Patient   RxID:   [id number] METOCLOPRAMIDE HCL 10 MG TABS (METOCLOPRAMIDE HCL) 1 by mouth three times a day ac  #270 x 3   Entered and Authorized by:   Tresa Garter MD   Signed by:   Tresa Garter MD on 10/26/2009   Method used:   Print then Give to Patient   RxID:   [id number]    .[email or web address]   Flu Vaccine Consent Questions     Do you have a history of severe allergic reactions to this vaccine? no    Any prior  history of allergic reactions to egg and/or gelatin? no    Do you have a sensitivity to the preservative Thimersol? no    Do you have a past history of Guillan-Barre Syndrome? no    Do you currently have an acute febrile illness? no    Have you ever had a severe reaction to latex? no    Vaccine information given and explained to patient? yes    Are you currently pregnant? no    Lot Number:[id number]   Exp Date:07/14/2010   Site Given  Left Deltoid IM Lanier Prude, [facility])  October 26, 2009 11:10 AM

## 2010-02-13 NOTE — Assessment & Plan Note (Signed)
Summary: 3 MO ROV/NWS  #   Vital Signs:  Patient profile:   50 year old female Height:      67 inches (170.18 cm) Weight:      297 pounds (135 kg) BMI:     46.68 O2 Sat:      94 % on Room air Temp:     97.4 degrees F (36.33 degrees C) oral Pulse rate:   86 / minute Pulse rhythm:   regular Resp:     16 per minute BP sitting:   120 / 78  (right arm) Cuff size:   large  Vitals Entered By: Lanier Prude, CMA(AAMA) (August 01, 2009 8:57 AM)  O2 Flow:  Room air CC: 3  mo f/u Is Patient Diabetic? No Comments Pt was not using cane and fell Saturday and landed on Lt arm/leg.     Primary Care Provider:  Plotnikov  CC:  3  mo f/u.  History of Present Illness: The patient presents for a follow up ofsarcoid, grief, back pain, anxiety, depression and headaches. Mom died 07/30/22.   Current Medications (verified): 1)  Celexa 40 Mg  Tabs (Citalopram Hydrobromide) .... Take 1.5  Tablet By Mouth Once A Day 2)  Coreg 12.5 Mg  Tabs (Carvedilol) .... Bid 3)  Buspar 10 Mg Tabs (Buspirone Hcl) .[name].. 1 By Mouth Two Times A Day 4)  Klor-Con 10 10 Meq Tbcr (Potassium Chloride) .... Take 1 Tab By Mouth Daily 5)  Allegra-D 12 Hour 60-120 Mg  Tb12 (Fexofenadine-Pseudoephedrine) .... Take 1 Two Times A Day 6)  Metoclopramide Hcl 10 Mg Tabs (Metoclopramide Hcl) .[name].. 1 By Mouth Three Times A Day Ac 7)  Prednisone 10 Mg  Tabs (Prednisone) .[name].. 1-2  By Mouth Once Daily As Dirrected 8)  Lorazepam 1 Mg  Tabs (Lorazepam) .[name].. 1 By Mouth Two Times A Day As Needed Anxiety or Insomnia 9)  Vitamin B-12 Cr 1000 Mcg  Tbcr (Cyanocobalamin) .... Take One Half Tablet By Mouth Daily 10)  Vitamin D3 1000 Unit  Tabs (Cholecalciferol) .... 2 By Mouth Daily 11)  Triamcinolone Acetonide 0.5 % Crea (Triamcinolone Acetonide) .... Apply Bid To Affected Area 12)  Fexofenadine Hcl 180 Mg Tabs (Fexofenadine Hcl) .[name].. 1 Once Daily As Needed Allergies 13)  Nexium 40 Mg Cpdr (Esomeprazole Magnesium) .... Take 1 Tab Each Morning 14)   Fish Oil 1000 Mg Caps (Omega-3 Fatty Acids) .... Once Daily 15)  Hydrocodone-Acetaminophen 10-325 Mg Tabs (Hydrocodone-Acetaminophen) .[name].. 1 By Mouth Up To 4 Times A Day By Mouth 16)  Aspirin 81 Mg Tbec (Aspirin) .[name].. 1 By Mouth Qd  Allergies (verified): 1)  Enalapril Maleate 2)  Hydrochlorothiazide 3)  Plaquenil (Hydroxychloroquine Sulfate)  Past History:  Past Medical History: Last updated: 06/07/2009 1. Abdominal pain (R low quad) 2. UTI  3. Family Hx of CAD (female) 4. Sinusitis 5. Osteoarthritis 6. Sarcoidosis Dr Wilford Grist 7. GERD  8. Depression 9. Anxiety  10. * Endometrial ablation 11. Vit D def 2009 12. Vit B12 def 2009 13. Ruptured appendix 1/09 14. Hypertension 15. OSA  Past Surgical History: Last updated: 05/05/2007 arthroscopic surgery on the knee left 11/12/04 Appendectomy 2009  Social History: Last updated: 08/25/2007 Occupation: disabled Single, broke up with her partner in 2008 Never Smoked  Review of Systems       The patient complains of dyspnea on exertion, difficulty walking, and depression.  The patient denies chest pain and syncope.    Physical Exam  General:  NAD, overweight-appearing.   Ears:  soft wax B Mouth:  . Neck:  No deformities, masses, or tenderness noted. Lungs:  Normal respiratory effort, chest expands symmetrically. Lungs are clear to auscultation, no crackles or wheezes. Heart:  Normal rate and regular rhythm. S1 and S2 normal without gallop, murmur, click, rub or other extra sounds. Abdomen:  Bowel sounds positive,abdomen soft and non-tender without masses, organomegaly or hernias noted. Msk:  Lumbar-sacral spine is not  tender to palpation over paraspinal muscles and less painfull with the ROM  Neurologic:  No cranial nerve deficits noted. Station and gait are normal. Plantar reflexes are down-going bilaterally. DTRs are symmetrical throughout. Sensory, motor and coordinative functions appear intact. Skin:  Sacoid on face  and L ear is  purple, not worse Psych:  normally interactive, good eye contact, and depressed affect.  tearful.     Impression & Recommendations:  Problem # 1:  HYPERTENSION (ICD-401.9) Assessment Unchanged  Her updated medication list for this problem includes:    Coreg 12.5 Mg Tabs (Carvedilol) ..... Bid  Orders: TLB-A1C / Hgb A1C (Glycohemoglobin) (83036-A1C) TLB-B12, Serum-Total ONLY (24235-T61) TLB-CBC Platelet - w/Differential (85025-CBCD) TLB-BMP (Basic Metabolic Panel-BMET) (80048-METABOL) TLB-Hepatic/Liver Function Pnl (80076-HEPATIC) TLB-TSH (Thyroid Stimulating Hormone) (84443-TSH) TLB-Udip ONLY (81003-UDIP)  BP today: 120/78 Prior BP: 116/80 (06/07/2009)  Labs Reviewed: K+: 3.7 (04/24/2009) Creat: : 1.1 (04/24/2009)   Chol: 193 (06/29/2008)   HDL: 67.50 (06/29/2008)   LDL: 97 (06/29/2008)   TG: 145.0 (06/29/2008)  Problem # 2:  B12 DEFICIENCY (ICD-266.2) Assessment: Comment Only  Orders: TLB-A1C / Hgb A1C (Glycohemoglobin) (83036-A1C) TLB-B12, Serum-Total ONLY (44315-Q00) TLB-CBC Platelet - w/Differential (85025-CBCD) TLB-BMP (Basic Metabolic Panel-BMET) (80048-METABOL) TLB-Hepatic/Liver Function Pnl (80076-HEPATIC) TLB-TSH (Thyroid Stimulating Hormone) (84443-TSH) TLB-Udip ONLY (81003-UDIP)  Problem # 3:  ANXIETY (ICD-300.00) Assessment: Deteriorated  Her updated medication list for this problem includes:    Celexa 40 Mg Tabs (Citalopram hydrobromide) .[name]... Take 1.5  tablet by mouth once a day    Buspar 10 Mg Tabs (Buspirone hcl) .[name]... 1 by mouth two times a day    Lorazepam 1 Mg Tabs (Lorazepam) .[name]... 1 by mouth two times a day as needed anxiety or insomnia  Problem # 4:  DEPRESSION (ICD-311) Assessment: Deteriorated Hospice councelling suggested Her updated medication list for this problem includes:    Celexa 40 Mg Tabs (Citalopram hydrobromide) .[name]... Take 1.5  tablet by mouth once a day    Buspar 10 Mg Tabs (Buspirone hcl) .[name]... 1 by mouth two  times a day    Lorazepam 1 Mg Tabs (Lorazepam) .[name]... 1 by mouth two times a day as needed anxiety or insomnia  Problem # 5:  GRIEF REACTION (ICD-309.0) Assessment: New Discussed  Complete Medication List: 1)  Celexa 40 Mg Tabs (Citalopram hydrobromide) .... Take 1.5  tablet by mouth once a day 2)  Coreg 12.5 Mg Tabs (Carvedilol) .... Bid 3)  Buspar 10 Mg Tabs (Buspirone hcl) .[name].. 1 by mouth two times a day 4)  Klor-con 10 10 Meq Tbcr (Potassium chloride) .... Take 1 tab by mouth daily 5)  Allegra-d 12 Hour 60-120 Mg Tb12 (Fexofenadine-pseudoephedrine) .... Take 1 two times a day 6)  Metoclopramide Hcl 10 Mg Tabs (Metoclopramide hcl) .[name].. 1 by mouth three times a day ac 7)  Prednisone 10 Mg Tabs (Prednisone) .[name].. 1-2  by mouth once daily as dirrected 8)  Lorazepam 1 Mg Tabs (Lorazepam) .[name].. 1 by mouth two times a day as needed anxiety or insomnia 9)  Vitamin B-12 Cr 1000 Mcg Tbcr (Cyanocobalamin) .... Take one  half tablet by mouth daily 10)  Vitamin D3 1000 Unit Tabs (Cholecalciferol) .... 2 by mouth daily 11)  Triamcinolone Acetonide 0.5 % Crea (Triamcinolone acetonide) .... Apply bid to affected area 12)  Fexofenadine Hcl 180 Mg Tabs (Fexofenadine hcl) .[name].. 1 once daily as needed allergies 13)  Nexium 40 Mg Cpdr (Esomeprazole magnesium) .... Take 1 tab each morning 14)  Fish Oil 1000 Mg Caps (Omega-3 fatty acids) .... Once daily 15)  Hydrocodone-acetaminophen 10-325 Mg Tabs (Hydrocodone-acetaminophen) .[name].. 1 by mouth up to 4 times a day by mouth 16)  Aspirin 81 Mg Tbec (Aspirin) .[name].. 1 by mouth qd  Patient Instructions: 1)  Please schedule a follow-up appointment in 3 months.

## 2010-02-13 NOTE — Medication Information (Signed)
Summary: [facility] for Prednisone/[facility]  [facility] for Prednisone/[facility]   Imported By: Sherian Rein 05/08/2009 10:20:52  _____________________________________________________________________  External Attachment:    Type:   Image     Comment:   External Document

## 2010-02-13 NOTE — Progress Notes (Signed)
Summary: Lorazepam  Phone Note Refill Request Message from:  Fax from Pharmacy on March 30, 2009 10:47 AM  Refills Requested: Medication #1:  LORAZEPAM 1 MG  TABS 1 by mouth two times a day as needed anxiety or insomnia Next Appointment Scheduled: 04-28-09 Initial call taken by: Lucious Groves,  March 30, 2009 10:47 AM  Follow-up for Phone Call        ok 3 ref Follow-up by: Tresa Garter MD,  March 30, 2009 1:13 PM    Prescriptions: LORAZEPAM 1 MG  TABS (LORAZEPAM) 1 by mouth two times a day as needed anxiety or insomnia  #180 x 1   Entered by:   Lamar Sprinkles, CMA   Authorized by:   Tresa Garter MD   Signed by:   Lamar Sprinkles, CMA on 03/31/2009   Method used:   Telephoned to ...       [facility] [address] [phone]* (retail)       [address]       [place], [place]  [address]       Ph: [phone]       Fax: [phone]   RxID:   [id number]

## 2010-02-13 NOTE — Progress Notes (Signed)
Summary: Nuclear Pre-Procedure  Phone Note Outgoing Call Call back at Home Phone [phone]   Call placed by: Stanton Kidney, EMT-P,  July 20, 2009 11:02 AM Action Taken: Phone Call Completed Summary of Call: Left message with information on [employer] Information Sheet (see scanned document for details).     Nuclear Med Background Indications for Stress Test: Evaluation for Ischemia     Symptoms: Chest Pain, Chest Tightness, SOB    Nuclear Pre-Procedure Cardiac Risk Factors: Family History - CAD, History of Smoking, NIDDM Height (in): 67

## 2010-02-13 NOTE — Progress Notes (Signed)
Summary: Hydrococone Rf/Plot pt  Phone Note Refill Request Message from:  Pharmacy  Refills Requested: Medication #1:  HYDROCODONE-ACETAMINOPHEN 10-325 MG TABS 1 by mouth up to 4 times a day by mouth   Dosage confirmed as above?Dosage Confirmed   Supply Requested: 1 month   Last Refilled: 06/29/2009 **[facility] [address]   Method Requested: Telephone to Pharmacy Next Appointment Scheduled: 08/01/2009 Initial call taken by: Lanier Prude, [facility]),  July 26, 2009 8:15 AM  Follow-up for Phone Call        done hardcopy to LIM side B - dahlia  Follow-up by: Corwin Levins MD,  July 26, 2009 9:37 AM  Additional Follow-up for Phone Call Additional follow up Details #1::        Rx faxed to pharmacy  Additional Follow-up by: Margaret Pyle, CMA,  July 26, 2009 9:50 AM    Prescriptions: HYDROCODONE-ACETAMINOPHEN 10-325 MG TABS (HYDROCODONE-ACETAMINOPHEN) 1 by mouth up to 4 times a day by mouth  #120 x 0   Entered and Authorized by:   Corwin Levins MD   Signed by:   Corwin Levins MD on 07/26/2009   Method used:   Print then Give to Patient   RxID:   [id number]

## 2010-02-13 NOTE — Progress Notes (Signed)
Summary: Rf Vicodin  Phone Note Refill Request Message from:  Pharmacy  Refills Requested: Medication #1:  HYDROCODONE-ACETAMINOPHEN 10-325 MG TABS 1 by mouth up to 4 times a day by mouth   Dosage confirmed as above?Dosage Confirmed   Supply Requested: 120   Last Refilled: 10/26/2009  Method Requested: Telephone to Pharmacy Next Appointment Scheduled: 03-01-10 Initial call taken by: Lanier Prude, [facility]),  November 24, 2009 11:23 AM  Follow-up for Phone Call        ok to ref Follow-up by: Tresa Garter MD,  November 24, 2009 1:32 PM  Additional Follow-up for Phone Call Additional follow up Details #1::        Rx called to pharmacy Additional Follow-up by: Lanier Prude, [facility]),  November 24, 2009 2:01 PM    Prescriptions: HYDROCODONE-ACETAMINOPHEN 10-325 MG TABS (HYDROCODONE-ACETAMINOPHEN) 1 by mouth up to 4 times a day by mouth  #120 x 2   Entered by:   Lanier Prude, CMA(AAMA)   Authorized by:   Tresa Garter MD   Signed by:   Lanier Prude, CMA(AAMA) on 11/24/2009   Method used:   Telephoned to ...       [facility] [address] #[id number]* (retail)       [address] [place], [place]  [address]       Ph: [phone]       Fax: [phone]   RxID:   [id number]

## 2010-02-13 NOTE — Miscellaneous (Signed)
Summary: Appointment Canceled  Appointment status changed to canceled by [employer] on 08/21/2009 2:38 PM.  Cancellation Comments --------------------- wt 295 /lexiscan/786.59/bensimhon/mcr/no prec. req/saf/lexiscan s/  Appointment Information ----------------------- Appt Type:  CARDIOLOGY NUCLEAR TESTING      Date:  Thursday, August 31, 2009      Time:  8:15 AM for 15 min   Urgency:  Routine   Made By:  Hoy Finlay Scheduler  To Visit:  [id number]    Reason:  wt 295 /lexiscan/786.59/bensimhon/mcr/no prec. req/saf/lexiscan s/  Appt Comments ------------- -- 08/21/09 14:38: ([facility]) CANCELED -- wt 295 /lexiscan/786.59/bensimhon/mcr/no prec. req/saf/lexiscan s/ -- 08/01/09 16:30: ([facility]) BOOKED -- Routine CARDIOLOGY NUCLEAR TESTING at 08/31/2009 8:15 AM for 15 min wt 295 /lexiscan/786.59/bensimhon/mcr/no prec

## 2010-02-13 NOTE — Medication Information (Signed)
Summary: Prior [facility] for Citalopram[facility]  [facility] for Citalopram[facility]   Imported By: Sherian Rein 07/07/2009 10:05:02  _____________________________________________________________________  External Attachment:    Type:   Image     Comment:   External Document

## 2010-02-13 NOTE — Assessment & Plan Note (Signed)
Summary: 4 mos f/u #/cd   Vital Signs:  Patient profile:   50 year old female Height:      67 inches Weight:      295 pounds BMI:     46.37 O2 Sat:      97 % on Room air Temp:     96.7 degrees F oral Pulse rate:   80 / minute BP sitting:   110 / 80  (left arm) Cuff size:   large  Vitals Entered By: Lucious Groves (April 28, 2009 10:23 AM)  O2 Flow:  Room air CC: F/U--c/o SOB x3 weeks, and discuss steroids. Refill Hydrocodone./kb Is Patient Diabetic? No Pain Assessment Patient in pain? no        Primary Care Provider:  Jalyne Brodzinski  CC:  F/U--c/o SOB x3 weeks and and discuss steroids. Refill Hydrocodone./kb.  History of Present Illness: C/o pain in L shoulder on Mon night she was SOB and it was hard to swollow - 5-6 min; pain stopped. It happened a month ago C/o sarcoidosis - worse C/o worsening SOB C/o green sinus d/c  Current Medications (verified): 1)  Celexa 40 Mg  Tabs (Citalopram Hydrobromide) .... Take 1.5  Tablet By Mouth Once A Day 2)  Coreg 12.5 Mg  Tabs (Carvedilol) .... Bid 3)  Buspar 10 Mg Tabs (Buspirone Hcl) .[name].. 1 By Mouth Two Times A Day 4)  Klor-Con 10 10 Meq Tbcr (Potassium Chloride) .... Take 1 Tab By Mouth Daily 5)  Allegra-D 12 Hour 60-120 Mg  Tb12 (Fexofenadine-Pseudoephedrine) .... Take 1 Two Times A Day 6)  Hydrocodone-Acetaminophen 5-500 Mg  Tabs (Hydrocodone-Acetaminophen) .... Take 1 Tablet By Mouth Four Times A Day As Needed Pain 7)  Metoclopramide Hcl 10 Mg Tabs (Metoclopramide Hcl) .[name].. 1 By Mouth Three Times A Day Ac 8)  Prednisone 10 Mg  Tabs (Prednisone) .[name].. 1-2  By Mouth Once Daily As Dirr 9)  Lorazepam 1 Mg  Tabs (Lorazepam) .[name].. 1 By Mouth Two Times A Day As Needed Anxiety or Insomnia 10)  Vitamin B-12 Cr 1000 Mcg  Tbcr (Cyanocobalamin) .... Take One Half Tablet By Mouth Daily 11)  Vitamin D3 1000 Unit  Tabs (Cholecalciferol) .... 2 By Mouth Daily 12)  Triamcinolone Acetonide 0.5 % Crea (Triamcinolone Acetonide) .... Apply Bid To  Affected Area 13)  Fexofenadine Hcl 180 Mg Tabs (Fexofenadine Hcl) .[name].. 1 Once Daily As Needed Allergies 14)  Nexium 40 Mg Cpdr (Esomeprazole Magnesium) .... Take 1 Tab Each Morning 15)  Fish Oil 1000 Mg Caps (Omega-3 Fatty Acids) .... Once Daily 16)  Promethazine-Codeine 6.25-10 Mg/24ml Syrp (Promethazine-Codeine) .... 5-10 Ml By Mouth Q Id As Needed Cough  Allergies (verified): 1)  Enalapril Maleate 2)  Hydrochlorothiazide 3)  Plaquenil (Hydroxychloroquine Sulfate)  Past History:  Past Medical History: Last updated: 08/25/2007 ABDOMINAL PAIN RIGHT LOWER QUADRANT (ICD-789.03) UTI (ICD-599.0) FAMILY HISTORY OF CAD FEMALE 1ST DEGREE RELATIVE <60 (ICD-V16.49) SINUSITIS, ACUTE (ICD-461.9) OSTEOARTHRITIS (ICD-715.90) SARCOIDOSIS (ICD-135) Dr Wilford Grist GERD (ICD-530.81) DEPRESSION (ICD-311) ANXIETY (ICD-300.00) * ENDOMETRIAL ABLATION Vit D def 2009 Vit B12 def 2009 Anxiety Depression GERD Sarcoidosis Osteoarthritis Ruptured appendix 1/09 Hypertension  Past Surgical History: Last updated: 05/05/2007 arthroscopic surgery on the knee left 11/12/04 Appendectomy 2009  Social History: Last updated: 08/25/2007 Occupation: disabled Single, broke up with her partner in 2008 Never Smoked  Family History: Family History of CAD Female 1st degree relative <60, CHF Family History Hypertension Family History Breast cancer 1st degree relative <50 S Family History of CAD Female 1st degree relative <  50 B  Review of Systems       The patient complains of weight gain, chest pain, and dyspnea on exertion.  The patient denies prolonged cough and abdominal pain.    Physical Exam  General:  NAD, overweight-appearing.   Nose:  Erythematous throat mucosa and intranasal erythema.  Mouth:  . Lungs:  Normal respiratory effort, chest expands symmetrically. Lungs are clear to auscultation, no crackles or wheezes. Heart:  Normal rate and regular rhythm. S1 and S2 normal without gallop, murmur,  click, rub or other extra sounds. Abdomen:  Bowel sounds positive,abdomen soft and non-tender without masses, organomegaly or hernias noted. Msk:  Lumbar-sacral spine is not  tender to palpation over paraspinal muscles and less painfull with the ROM  Extremities:  No edema B Neurologic:  No cranial nerve deficits noted. Station and gait are normal. Plantar reflexes are down-going bilaterally. DTRs are symmetrical throughout. Sensory, motor and coordinative functions appear intact. Skin:  Sacoid on face and L ear is  purple, not worse Psych:  normally interactive, good eye contact, and depressed affect.     Impression & Recommendations:  Problem # 1:  DYSPNEA (ICD-786.05) - multifactorrial; we need to r/o CAD Assessment Deteriorated  Orders: Cardiology Referral (Cardiology) T-2 View CXR, Same Day (71020.5TC)  Problem # 2:  MAXILLARY SINUSITIS (ICD-473.0) Assessment: New  The following medications were removed from the medication list:    Promethazine-codeine 6.25-10 Mg/59ml Syrp (Promethazine-codeine) .[name]... 5-10 ml by mouth q id as needed cough Her updated medication list for this problem includes:    Allegra-d 12 Hour 60-120 Mg Tb12 (Fexofenadine-pseudoephedrine) .[name]... Take 1 two times a day    Ceftin 500 Mg Tabs (Cefuroxime axetil) .[name]... 1 by mouth bid  Problem # 3:  B12 DEFICIENCY (ICD-266.2) Assessment: Improved On prescription/OTC  therapy   Problem # 4:  HYPERGLYCEMIA (ICD-790.29) Assessment: Improved  Labs Reviewed: Creat: 1.1 (04/24/2009)     Problem # 5:  HYPERTENSION (ICD-401.9) Assessment: Unchanged  Her updated medication list for this problem includes:    Coreg 12.5 Mg Tabs (Carvedilol) ..... Bid  Problem # 6:  DEPRESSION (ICD-311) Assessment: Unchanged  Her updated medication list for this problem includes:    Celexa 40 Mg Tabs (Citalopram hydrobromide) .[name]... Take 1.5  tablet by mouth once a day    Buspar 10 Mg Tabs (Buspirone hcl) .[name]... 1 by mouth two  times a day    Lorazepam 1 Mg Tabs (Lorazepam) .[name]... 1 by mouth two times a day as needed anxiety or insomnia  Complete Medication List: 1)  Celexa 40 Mg Tabs (Citalopram hydrobromide) .... Take 1.5  tablet by mouth once a day 2)  Coreg 12.5 Mg Tabs (Carvedilol) .... Bid 3)  Buspar 10 Mg Tabs (Buspirone hcl) .[name].. 1 by mouth two times a day 4)  Klor-con 10 10 Meq Tbcr (Potassium chloride) .... Take 1 tab by mouth daily 5)  Allegra-d 12 Hour 60-120 Mg Tb12 (Fexofenadine-pseudoephedrine) .... Take 1 two times a day 6)  Metoclopramide Hcl 10 Mg Tabs (Metoclopramide hcl) .[name].. 1 by mouth three times a day ac 7)  Prednisone 10 Mg Tabs (Prednisone) .[name].. 1-2  by mouth once daily as dirrected 8)  Lorazepam 1 Mg Tabs (Lorazepam) .[name].. 1 by mouth two times a day as needed anxiety or insomnia 9)  Vitamin B-12 Cr 1000 Mcg Tbcr (Cyanocobalamin) .... Take one half tablet by mouth daily 10)  Vitamin D3 1000 Unit Tabs (Cholecalciferol) .... 2 by mouth daily 11)  Triamcinolone Acetonide  0.5 % Crea (Triamcinolone acetonide) .... Apply bid to affected area 12)  Fexofenadine Hcl 180 Mg Tabs (Fexofenadine hcl) .[name].. 1 once daily as needed allergies 13)  Nexium 40 Mg Cpdr (Esomeprazole magnesium) .... Take 1 tab each morning 14)  Fish Oil 1000 Mg Caps (Omega-3 fatty acids) .... Once daily 15)  Hydrocodone-acetaminophen 10-325 Mg Tabs (Hydrocodone-acetaminophen) .[name].. 1 by mouth up to 4 times a day by mouth 16)  Ceftin 500 Mg Tabs (Cefuroxime axetil) .[name].. 1 by mouth bid 17)  Aspirin 81 Mg Tbec (Aspirin) .[name].. 1 by mouth qd  Patient Instructions: 1)  Call if you are not better in a reasonable amount of time or if worse. Go to ER if feeling really bad!  2)  Please schedule a follow-up appointment in 3 months. Prescriptions: CEFTIN 500 MG TABS (CEFUROXIME AXETIL) 1 by mouth bid  #20 x 1   Entered and Authorized by:   Tresa Garter MD   Signed by:   Tresa Garter MD on 04/28/2009   Method used:   Print then  Give to Patient   RxID:   [id number] HYDROCODONE-ACETAMINOPHEN 10-325 MG TABS (HYDROCODONE-ACETAMINOPHEN) 1 by mouth up to 4 times a day by mouth  #120 x 2   Entered and Authorized by:   Tresa Garter MD   Signed by:   Tresa Garter MD on 04/28/2009   Method used:   Print then Give to Patient   RxID:   [id number] NEXIUM 40 MG CPDR (ESOMEPRAZOLE MAGNESIUM) Take 1 tab each morning  #90 x 3   Entered and Authorized by:   Tresa Garter MD   Signed by:   Tresa Garter MD on 04/28/2009   Method used:   Print then Give to Patient   RxID:   [id number] PREDNISONE 10 MG  TABS (PREDNISONE) 1-2  by mouth once daily as dirrected  #180 x 1   Entered and Authorized by:   Tresa Garter MD   Signed by:   Tresa Garter MD on 04/28/2009   Method used:   Print then Give to Patient   RxID:   [id number]

## 2010-02-13 NOTE — Progress Notes (Signed)
Summary: REFILL - Lorazepam  Phone Note Refill Request Call back at Home Phone [phone]   Refills Requested: Medication #1:  LORAZEPAM 1 MG  TABS 1 by mouth two times a day as needed anxiety or insomnia   Notes: Req WRITTEN RX TO PICK UP Also wants to pick up tomorrow - Will need to inform pt that office will not be open tomorrow due to 5K race.   Initial call taken by: Lamar Sprinkles, CMA,  October 13, 2009 10:18 AM  Follow-up for Phone Call        ok 3 ref Follow-up by: Tresa Garter MD,  October 13, 2009 1:25 PM  Additional Follow-up for Phone Call Additional follow up Details #1::        Pt informed  Additional Follow-up by: Lamar Sprinkles, CMA,  October 13, 2009 4:26 PM    Prescriptions: LORAZEPAM 1 MG  TABS (LORAZEPAM) 1 by mouth two times a day as needed anxiety or insomnia  #180 x 0   Entered by:   Lamar Sprinkles, CMA   Authorized by:   Tresa Garter MD   Signed by:   Lamar Sprinkles, CMA on 10/13/2009   Method used:   Print then Give to Patient   RxID:   [id number]

## 2010-02-13 NOTE — Miscellaneous (Signed)
Summary: Appointment Canceled  Appointment status changed to canceled by [employer] on 08/01/2009 4:15 PM.  Cancellation Comments --------------------- o/786.59/bensimhon/mcr/no prec. req/saf/lexiscan s/  Appointment Information ----------------------- Appt Type:  CARDIOLOGY NUCLEAR TESTING      Date:  Monday, July 24, 2009      Time:  8:15 AM for 15 min   Urgency:  Routine   Made By:  [name] Grippe  To Visit:  [id number]    Reason:  wt 295/lexiscan s/o/786.59/bensimhon/mcr/no prec. req/saf  Appt Comments ------------- -- 08/01/09 16:15: ([facility]) CANCELED -- o/786.59/bensimhon/mcr/no prec. req/saf/lexiscan s/ -- 07/20/09 11:03: ([facility]) CANCELED -- wt 295/lexiscan s/o/786.59/bensimhon/mcr/no prec. req/saf

## 2010-02-13 NOTE — Progress Notes (Signed)
Summary: Allegra alt?-plot pt  Phone Note Call from Patient   Caller: Patient Summary of Call: pt called stating Allegra  is no longer available. I informed her that this med is available OTC.  She is requesting a diff rx(but not loratadine) to replace this. Please advise. Initial call taken by: Lanier Prude, [facility]),  October 30, 2009 3:43 PM  Follow-up for Phone Call        there is no prescription med to replace allegra - may try OTC zytrec if allegra and claritin ineffective - thanks Follow-up by: Newt Lukes MD,  October 30, 2009 5:26 PM  Additional Follow-up for Phone Call Additional follow up Details #1::        pt informed  Additional Follow-up by: Lanier Prude, [facility]),  October 31, 2009 8:09 AM

## 2010-02-13 NOTE — Progress Notes (Signed)
Summary: Rf gen Vicodin  Phone Note Refill Request   Refills Requested: Medication #1:  HYDROCODONE-ACETAMINOPHEN 10-325 MG TABS 1 by mouth up to 4 times a day by mouth   Dosage confirmed as above?Dosage Confirmed   Supply Requested: 120   Last Refilled: 07/26/2009  Method Requested: Telephone to Pharmacy Initial call taken by: Lanier Prude, [facility]),  August 28, 2009 11:59 AM  Follow-up for Phone Call        ok 2 ref Follow-up by: Tresa Garter MD,  August 28, 2009 5:47 PM  Additional Follow-up for Phone Call Additional follow up Details #1::        Rx called to pharmacy Additional Follow-up by: Lanier Prude, [facility]),  August 29, 2009 9:17 AM    Prescriptions: HYDROCODONE-ACETAMINOPHEN 10-325 MG TABS (HYDROCODONE-ACETAMINOPHEN) 1 by mouth up to 4 times a day by mouth  #120 x 2   Entered by:   Lanier Prude, CMA(AAMA)   Authorized by:   Tresa Garter MD   Signed by:   Lanier Prude, CMA(AAMA) on 08/29/2009   Method used:   Telephoned to ...       [id number]* (retail)       [address] [place], [place]  [address]       Ph: [phone]       Fax: [phone]   RxID:   [id number]

## 2010-02-13 NOTE — Medication Information (Signed)
Summary: Approved Prednisone /  [facility]  Approved Prednisone /  [employer]   Imported By: Lennie Odor 05/15/2009 13:55:35  _____________________________________________________________________  External Attachment:    Type:   Image     Comment:   External Document  Appended Document: Approved Prednisone /  [employer] MD aware, signed doc.

## 2010-02-13 NOTE — Progress Notes (Signed)
Summary: Citalopram PA  Phone Note From Pharmacy   Caller: [facility] Details of Request: [employer] [phone] Case #:  [id number] Summary of Call: PA request--Citalopram. Form requested. Initial call taken by: Lucious Groves,  July 04, 2009 2:30 PM  Follow-up for Phone Call        Completed and faxed, will awa it insurance company reply. Follow-up by: Lucious Groves,  July 04, 2009 3:06 PM     Appended Document: Citalopram PA Approved until 2012.

## 2010-02-13 NOTE — Progress Notes (Signed)
Summary: [facility]  Phone Note From Pharmacy   Caller: [phone] CASE # [id number] Summary of Call: [facility] pharm called req a call back regarding medication review.  Initial call taken by: Lamar Sprinkles, CMA,  May 03, 2009 2:03 PM  Follow-up for Phone Call        completed via fax. Follow-up by: Lucious Groves,  May 03, 2009 3:12 PM     Appended Document: [employer] Prednisone is approved until 2014.

## 2010-02-13 NOTE — Medication Information (Signed)
Summary: Citalopram Approved/[facility]  Citalopram Approved/[employer]   Imported By: Sherian Rein 07/10/2009 09:49:10  _____________________________________________________________________  External Attachment:    Type:   Image     Comment:   External Document

## 2010-02-13 NOTE — Progress Notes (Signed)
  Phone Note Refill Request Call back at Home Phone [phone]   Refills Requested: Medication #1:  COREG 12.5 MG  TABS bid Patient is requesting a call back about this refill.   Initial call taken by: Lamar Sprinkles, CMA,  August 29, 2009 3:22 PM    Prescriptions: COREG 12.5 MG  TABS (CARVEDILOL) bid  #180 Each x 2   Entered by:   Lamar Sprinkles, CMA   Authorized by:   Tresa Garter MD   Signed by:   Lamar Sprinkles, CMA on 08/29/2009   Method used:   Electronically to        [facility] [id number]* (retail)       [address] [place], [place]  [address]       Ph: [phone]       Fax: [phone]   RxID:   [id number]

## 2010-02-13 NOTE — Progress Notes (Signed)
Summary: Refill-Citalopram  Phone Note Refill Request Message from:  Fax from Pharmacy on July 03, 2009 8:47 AM  Refills Requested: Medication #1:  CELEXA 40 MG  TABS Take 1.5  tablet by mouth once a day Next Appointment Scheduled: 08-01-09 Initial call taken by: Lucious Groves,  July 03, 2009 8:47 AM    Prescriptions: CELEXA 40 MG  TABS (CITALOPRAM HYDROBROMIDE) Take 1.5  tablet by mouth once a day  #135 x 3   Entered by:   Lamar Sprinkles, CMA   Authorized by:   Tresa Garter MD   Signed by:   Lamar Sprinkles, CMA on 07/03/2009   Method used:   Electronically to        [facility] [id number]* (retail)       [address] [place], [place]  [address]       Ph: [phone]       Fax: [phone]   RxID:   [id number]

## 2010-02-13 NOTE — Letter (Signed)
Summary: [facility], Main Office  [address]   [place], [place] [address]   Phone: [phone]  Fax: [phone]        September 04, 2009 MRN: [id number]    MARGAN ELIAS [address] [place], [place]  [address]    Dear Ms. Ida Rogue,  Our records indicate that you have cancelled your stress test.  It is  Dr Bensimhon's recommendation that you have this test done.  Please contact our office as soon as possible to reschedule.     Sincerely,  Meredith Staggers, RN Arvilla Meres, MD This letter has been electronically signed by your physician.

## 2010-02-13 NOTE — Assessment & Plan Note (Signed)
Summary: np6/ sob, left shoulder pain. pt has medicare medicade/ gd     Visit Type:  new pt visit Primary Provider:  [facility]  CC:  chest pain and left arm started 1 mon tha go.....sob/sarcododsis.....edema/ankles.  History of Present Illness: [name] is a 50 y/o woman with h/o morbid obesity, HTN, GERD, OSA and sarcoidosis referred by Dr. Posey Rea for further evaluation of CP.  Denies any h/o known heart disease. Has never had stress test or cath.  Symptoms started about a month ago. Had a bad episode initially. Was sitting on the bed. Got tight in her chest and SOB. Had time hard swallowing. Then L arm started hurting. Lasted 3-4 mins. Then resolved. Denies associated reflux signs or recent meal. Since that time has happened 2 more times while in bed. No symptoms when walking around.   Not very active due to diffuse bone and joint pain. Has been on prednisone for 10 years. Recently dose cut down. Takes Nexium.  + ankle edema. No significant cough, fever or chills. No melena. BRBPR. Quit smoking 2005.   Current Medications (verified): 1)  Celexa 40 Mg  Tabs (Citalopram Hydrobromide) .... Take 1.5  Tablet By Mouth Once A Day 2)  Coreg 12.5 Mg  Tabs (Carvedilol) .... Bid 3)  Buspar 10 Mg Tabs (Buspirone Hcl) .[name].. 1 By Mouth Two Times A Day 4)  Klor-Con 10 10 Meq Tbcr (Potassium Chloride) .... Take 1 Tab By Mouth Daily 5)  Allegra-D 12 Hour 60-120 Mg  Tb12 (Fexofenadine-Pseudoephedrine) .... Take 1 Two Times A Day 6)  Metoclopramide Hcl 10 Mg Tabs (Metoclopramide Hcl) .[name].. 1 By Mouth Three Times A Day Ac 7)  Prednisone 10 Mg  Tabs (Prednisone) .[name].. 1-2  By Mouth Once Daily As Dirrected 8)  Lorazepam 1 Mg  Tabs (Lorazepam) .[name].. 1 By Mouth Two Times A Day As Needed Anxiety or Insomnia 9)  Vitamin B-12 Cr 1000 Mcg  Tbcr (Cyanocobalamin) .... Take One Half Tablet By Mouth Daily 10)  Vitamin D3 1000 Unit  Tabs (Cholecalciferol) .... 2 By Mouth Daily 11)  Triamcinolone Acetonide 0.5 % Crea  (Triamcinolone Acetonide) .... Apply Bid To Affected Area 12)  Fexofenadine Hcl 180 Mg Tabs (Fexofenadine Hcl) .[name].. 1 Once Daily As Needed Allergies 13)  Nexium 40 Mg Cpdr (Esomeprazole Magnesium) .... Take 1 Tab Each Morning 14)  Fish Oil 1000 Mg Caps (Omega-3 Fatty Acids) .... Once Daily 15)  Hydrocodone-Acetaminophen 10-325 Mg Tabs (Hydrocodone-Acetaminophen) .[name].. 1 By Mouth Up To 4 Times A Day By Mouth 16)  Aspirin 81 Mg Tbec (Aspirin) .[name].. 1 By Mouth Qd  Allergies: 1)  Enalapril Maleate 2)  Hydrochlorothiazide 3)  Plaquenil (Hydroxychloroquine Sulfate)  Past History:  Past Surgical History: Last updated: 05/05/2007 arthroscopic surgery on the knee left 11/12/04 Appendectomy 2009  Family History: Last updated: 04/28/2009 Family History of CAD Female 1st degree relative <60, CHF Family History Hypertension Family History Breast cancer 1st degree relative <50 S Family History of CAD Female 1st degree relative <50 B  Social History: Last updated: 08/25/2007 Occupation: disabled Single, broke up with her partner in 2008 Never Smoked  Risk Factors: Smoking Status: never (12/30/2008)  Past Medical History: 1. Abdominal pain (R low quad) 2. UTI  3. Family Hx of CAD (female) 4. Sinusitis 5. Osteoarthritis 6. Sarcoidosis Dr Wilford Grist 7. GERD  8. Depression 9. Anxiety  10. * Endometrial ablation 11. Vit D def 2009 12. Vit B12 def 2009 13. Ruptured appendix 1/09 14. Hypertension 15. OSA  Family History:  Reviewed history from 04/28/2009 and no changes required. Family History of CAD Female 1st degree relative <60, CHF Family History Hypertension Family History Breast cancer 1st degree relative <50 S Family History of CAD Female 1st degree relative <50 B  Social History: Reviewed history from 08/25/2007 and no changes required. Occupation: disabled Single, broke up with her partner in 2008 Never Smoked  Review of Systems       As per HPI and past medical history;  otherwise all systems negative.   Vital Signs:  Patient profile:   50 year old female Height:      67 inches Weight:      295 pounds BMI:     46.37 Pulse rate:   83 / minute Pulse rhythm:   irregular BP sitting:   116 / 80  (left arm) Cuff size:   large  Vitals Entered By: Danielle Rankin, CMA (Jun 07, 2009 8:35 AM)  Physical Exam  General:  Well appearing. no resp difficulty HEENT: normal Neck: thick supple. no JVD. Carotids 2+ bilat; no bruits. No lymphadenopathy or thryomegaly appreciated. Cor: PMI nonpalple. Regular rate & rhythm. No rubs, gallops, murmur. Lungs: clear Abdomen: obese soft, nontender, nondistended. No hepatosplenomegaly. No bruits or masses. Midline scar Good bowel sounds. Extremities: no cyanosis, clubbing, rash, tr edema Neuro: alert & orientedx3, cranial nerves grossly intact. moves all 4 extremities w/o difficulty. affect pleasant    Impression & Recommendations:  Problem # 1:  CHEST TIGHTNESS-PRESSURE-OTHER ([id number]) Suspect this is likely GI (either PUD or gallbladder) but given risk factors needs stress test for risk stratification. Given severe arthritis will plan Lexiscan Myvoiew.   Other Orders: EKG w/ Interpretation (93000) Nuclear Stress Test (Nuc Stress Test)   Patient Instructions: 1)  Your physician has requested that you have a Lexiscan myoview.  For further information please visit [email or web address]  Please follow instruction sheet, as given.

## 2010-02-26 ENCOUNTER — Telehealth: Payer: Self-pay | Admitting: Internal Medicine

## 2010-03-07 ENCOUNTER — Other Ambulatory Visit: Payer: Medicare Other

## 2010-03-07 ENCOUNTER — Other Ambulatory Visit: Payer: Self-pay | Admitting: Internal Medicine

## 2010-03-07 ENCOUNTER — Ambulatory Visit (INDEPENDENT_AMBULATORY_CARE_PROVIDER_SITE_OTHER): Payer: Medicare Other | Admitting: Internal Medicine

## 2010-03-07 ENCOUNTER — Encounter: Payer: Self-pay | Admitting: Internal Medicine

## 2010-03-07 DIAGNOSIS — F4321 Adjustment disorder with depressed mood: Secondary | ICD-10-CM

## 2010-03-07 DIAGNOSIS — D869 Sarcoidosis, unspecified: Secondary | ICD-10-CM

## 2010-03-07 DIAGNOSIS — G4700 Insomnia, unspecified: Secondary | ICD-10-CM | POA: Insufficient documentation

## 2010-03-07 DIAGNOSIS — S025XXA Fracture of tooth (traumatic), initial encounter for closed fracture: Secondary | ICD-10-CM

## 2010-03-07 DIAGNOSIS — R110 Nausea: Secondary | ICD-10-CM | POA: Insufficient documentation

## 2010-03-07 DIAGNOSIS — E538 Deficiency of other specified B group vitamins: Secondary | ICD-10-CM

## 2010-03-07 DIAGNOSIS — R7309 Other abnormal glucose: Secondary | ICD-10-CM

## 2010-03-07 DIAGNOSIS — E669 Obesity, unspecified: Secondary | ICD-10-CM

## 2010-03-07 DIAGNOSIS — I10 Essential (primary) hypertension: Secondary | ICD-10-CM

## 2010-03-07 DIAGNOSIS — F411 Generalized anxiety disorder: Secondary | ICD-10-CM

## 2010-03-07 LAB — TSH: TSH: 1.52 u[IU]/mL (ref 0.35–5.50)

## 2010-03-07 LAB — BASIC METABOLIC PANEL
BUN: 15 mg/dL (ref 6–23)
CO2: 29 mEq/L (ref 19–32)
Chloride: 104 mEq/L (ref 96–112)
Potassium: 4.5 mEq/L (ref 3.5–5.1)

## 2010-03-07 LAB — VITAMIN B12: Vitamin B-12: 1153 pg/mL — ABNORMAL HIGH (ref 211–911)

## 2010-03-07 LAB — HEMOGLOBIN A1C: Hgb A1c MFr Bld: 6.2 % (ref 4.6–6.5)

## 2010-03-07 NOTE — Progress Notes (Signed)
Summary: RF  Phone Note Refill Request Message from:  Pharmacy  Refills Requested: Medication #1:  HYDROCODONE-ACETAMINOPHEN 10-325 MG TABS 1 by mouth up to 4 times a day by mouth [facility]  Initial call taken by: Lamar Sprinkles, CMA,  February 26, 2010 8:27 AM  Follow-up for Phone Call        ok 1 ref Follow-up by: Tresa Garter MD,  February 26, 2010 1:18 PM    Prescriptions: HYDROCODONE-ACETAMINOPHEN 10-325 MG TABS (HYDROCODONE-ACETAMINOPHEN) 1 by mouth up to 4 times a day by mouth  #120 x 2   Entered by:   Rock Nephew CMA   Authorized by:   Tresa Garter MD   Signed by:   Rock Nephew CMA on 02/26/2010   Method used:   Telephoned to ...       [facility] [address] [id number]* (retail)       [address] [place], [place]  [address]       Ph: [phone]       Fax: [phone]   RxID:   [id number]

## 2010-03-08 ENCOUNTER — Telehealth: Payer: Self-pay | Admitting: Internal Medicine

## 2010-03-22 NOTE — Progress Notes (Signed)
Summary: ALT RX  Phone Note Call from Patient   Summary of Call: Ins will only cover #15 mth. Patient is requesting alt rx.  Initial call taken by: Lamar Sprinkles, CMA,  March 08, 2010 12:26 PM  Follow-up for Phone Call        done Follow-up by: Tresa Garter MD,  March 11, 2010 11:58 PM  Additional Follow-up for Phone Call Additional follow up Details #1::        Did you mail rx to pt?................Marland KitchenLamar Sprinkles, CMA  March 12, 2010 3:15 PM     Additional Follow-up for Phone Call Additional follow up Details #2::    I've changed it to Pamelor Thank you!  Follow-up by: Tresa Garter MD,  March 12, 2010 9:28 PM  Additional Follow-up for Phone Call Additional follow up Details #3:: Details for Additional Follow-up Action Taken: I notified pt, looked like you gave it to her at office visit. She did not have rx - sent into pharm..................Marland KitchenLamar Sprinkles, CMA  March 13, 2010 10:19 AM   Prescriptions: PAMELOR 25 MG CAPS (NORTRIPTYLINE HCL) 1-2 by mouth at bedtime for insomnia  #60 x 6   Entered by:   Lamar Sprinkles, CMA   Authorized by:   Tresa Garter MD   Signed by:   Lamar Sprinkles, CMA on 03/13/2010   Method used:   Electronically to        [facility] [address] #[id number]* (retail)       [address] [place], [place]  [address]       Ph: [phone]       Fax: [phone]   RxID:   [id number]

## 2010-03-22 NOTE — Assessment & Plan Note (Signed)
Summary: 4 MO WELL MEDICARE/CD   Vital Signs:  Patient profile:   50 year old female Height:      67 inches Weight:      305 pounds BMI:     47.94 Temp:     97.7 degrees F oral Pulse rate:   76 / minute Pulse rhythm:   regular Resp:     16 per minute BP sitting:   120 / 78  (left arm) Cuff size:   large  Vitals Entered By: Lanier Prude, CMA(AAMA) (March 07, 2010 10:43 AM) CC: MWV Is Patient Diabetic? No Comments pt is not taking Allegra-D, Triamcinolone or Fexofenadine   Primary Care Provider:  Plotnikov  CC:  MWV.  History of Present Illness: The patient presents with complaints of sore throat, fever, cough, sinus congestion and drainge of 2 wks duration. Not better with OTC meds. Chest hurts with coughing.. Muscle aches are present.  The mucus is colored green.   Preventive Screening-Counseling & Management  Alcohol-Tobacco     Alcohol drinks/day: 0     Smoking Status: quit     Year Quit: 2004  Caffeine-Diet-Exercise     Caffeine use/day: 4     Does Patient Exercise: no  Hep-HIV-STD-Contraception     Dental Visit-last 6 months no     SBE monthly: no     Sun Exposure-Excessive: no  Safety-Violence-Falls     Seat Belt Use: yes     Helmet Use: n/a     Firearms in the Home: no firearms in the home     Smoke Detectors: yes     Violence in the Home: no risk noted     Sexual Abuse: no     Fall Risk: yes, previous falls in past  Current Medications (verified): 1)  Celexa 40 Mg  Tabs (Citalopram Hydrobromide) .... Take 1.5  Tablet By Mouth Once A Day 2)  Coreg 12.5 Mg  Tabs (Carvedilol) .... Bid 3)  Buspar 10 Mg Tabs (Buspirone Hcl) .[name].. 1 By Mouth Two Times A Day 4)  Klor-Con 10 10 Meq Tbcr (Potassium Chloride) .... Take 1 Tab By Mouth Daily 5)  Allegra-D 12 Hour 60-120 Mg  Tb12 (Fexofenadine-Pseudoephedrine) .... Take 1 Two Times A Day 6)  Metoclopramide Hcl 10 Mg Tabs (Metoclopramide Hcl) .[name].. 1 By Mouth Three Times A Day Ac 7)  Prednisone 10 Mg  Tabs  (Prednisone) .[name].. 1-2  By Mouth Once Daily As Dirrected 8)  Lorazepam 1 Mg  Tabs (Lorazepam) .[name].. 1 By Mouth Two Times A Day As Needed Anxiety or Insomnia 9)  Vitamin B-12 Cr 1000 Mcg  Tbcr (Cyanocobalamin) .... Take One Half Tablet By Mouth Daily 10)  Vitamin D3 1000 Unit  Tabs (Cholecalciferol) .... 2 By Mouth Daily 11)  Triamcinolone Acetonide 0.5 % Crea (Triamcinolone Acetonide) .... Apply Bid To Affected Area 12)  Fexofenadine Hcl 180 Mg Tabs (Fexofenadine Hcl) .[name].. 1 Once Daily As Needed Allergies 13)  Nexium 40 Mg Cpdr (Esomeprazole Magnesium) .... Take 1 Tab Each Morning 14)  Fish Oil 1000 Mg Caps (Omega-3 Fatty Acids) .... Once Daily 15)  Hydrocodone-Acetaminophen 10-325 Mg Tabs (Hydrocodone-Acetaminophen) .[name].. 1 By Mouth Up To 4 Times A Day By Mouth 16)  Aspirin 81 Mg Tbec (Aspirin) .[name].. 1 By Mouth Qd  Allergies (verified): 1)  Enalapril Maleate 2)  Hydrochlorothiazide 3)  Plaquenil (Hydroxychloroquine Sulfate)  Past History:  Past Medical History: Last updated: 10/26/2009 1. Abdominal pain (R low quad) 2. UTI  3. Family Hx of CAD (  female) 4. Sinusitis 5. Osteoarthritis 6. Sarcoidosis Dr Wilford Grist 7. GERD  8. Depression 9. Anxiety  10. * Endometrial ablation 11. Vit D def 2009 12. Vit B12 def 2009 13. Ruptured appendix 1/09 14. Hypertension 15. OSA Allergic rhinitis  Social History: Last updated: 08/25/2007 Occupation: disabled Single, broke up with her partner in 2008 Never Smoked  Social History: Smoking Status:  quit Caffeine use/day:  4 Does Patient Exercise:  no Dental Care w/in 6 mos.:  no Sun Exposure-Excessive:  no Seat Belt Use:  yes Fall Risk:  yes, previous falls in past  Review of Systems       The patient complains of weight gain, dyspnea on exertion, difficulty walking, and depression.  The patient denies fever, abdominal pain, and hematochezia.    Physical Exam  General:  NAD, more overweight-appearing.   Ears:  soft wax B Nose:   Erythematous throat mucosa and intranasal erythema.  Mouth:  .caries and broken front tooth Neck:  No deformities, masses, or tenderness noted. Lungs:  Normal respiratory effort, chest expands symmetrically. Lungs are clear to auscultation, no crackles or wheezes. Heart:  Normal rate and regular rhythm. S1 and S2 normal without gallop, murmur, click, rub or other extra sounds. Abdomen:  Bowel sounds positive,abdomen soft and non-tender without masses, organomegaly or hernias noted. Msk:  Lumbar-sacral spine is not  tender to palpation over paraspinal muscles and less painfull with the ROM  Extremities:  No edema B Neurologic:  No cranial nerve deficits noted. Station and gait are normal. Plantar reflexes are down-going bilaterally. DTRs are symmetrical throughout. Sensory, motor and coordinative functions appear intact. Skin:  Sacoid on face and L ear is  purple, not worse Psych:  normally interactive, good eye contact, and depressed affect.  tearful.     Impression & Recommendations:  Problem # 1:  SINUSITIS, ACUTE (ICD-461.9) B Assessment New  Her updated medication list for this problem includes:    Allegra-d 12 Hour 60-120 Mg Tb12 (Fexofenadine-pseudoephedrine) .[name]... Take 1 two times a day    Augmentin 875-125 Mg Tabs (Amoxicillin-pot clavulanate) .[name]... 1 by mouth bid Sonography Report: Indication: Clinical bedside ultrasound was performed using Terason 3000 machine with a linear transducer.  The images were stored in Terason.   Orders: EMR Misc Charge Code [facility])  Problem # 2:  NAUSEA (ICD-787.02) due to #1 Assessment: New  Problem # 3:  INSOMNIA, CHRONIC (ICD-307.42) Assessment: Deteriorated On the regimen of medicine(s) reflected in the chart    Problem # 4:  GRIEF REACTION (ICD-309.0) Assessment: Unchanged Discussed w/pt  Problem # 5:  B12 DEFICIENCY (ICD-266.2) Assessment: Comment Only On the regimen of medicine(s) reflected in the chart   Orders: TLB-B12,  Serum-Total ONLY (82607-B12) TLB-A1C / Hgb A1C (Glycohemoglobin) (83036-A1C) TLB-TSH (Thyroid Stimulating Hormone) (84443-TSH)  Problem # 6:  BROKEN TOOTH (MWN-027.25) Assessment: New antibiotic as above Oral surg cons Orders: Surgical Referral (Surgery)  Problem # 7:  OBESITY (ICD-278.00) Assessment: Deteriorated Diet discussed  Complete Medication List: 1)  Celexa 40 Mg Tabs (Citalopram hydrobromide) .... Take 1.5  tablet by mouth once a day 2)  Coreg 12.5 Mg Tabs (Carvedilol) .... Bid 3)  Buspar 10 Mg Tabs (Buspirone hcl) .[name].. 1 by mouth two times a day 4)  Klor-con 10 10 Meq Tbcr (Potassium chloride) .... Take 1 tab by mouth daily 5)  Allegra-d 12 Hour 60-120 Mg Tb12 (Fexofenadine-pseudoephedrine) .... Take 1 two times a day 6)  Metoclopramide Hcl 10 Mg Tabs (Metoclopramide hcl) .[name].. 1 by mouth three  times a day ac 7)  Prednisone 10 Mg Tabs (Prednisone) .[name].. 1-2  by mouth once daily as dirrected 8)  Vitamin B-12 Cr 1000 Mcg Tbcr (Cyanocobalamin) .... Take one half tablet by mouth daily 9)  Vitamin D3 1000 Unit Tabs (Cholecalciferol) .... 2 by mouth daily 10)  Triamcinolone Acetonide 0.5 % Crea (Triamcinolone acetonide) .... Apply bid to affected area 11)  Fexofenadine Hcl 180 Mg Tabs (Fexofenadine hcl) .[name].. 1 once daily as needed allergies 12)  Nexium 40 Mg Cpdr (Esomeprazole magnesium) .... Take 1 tab each morning 13)  Fish Oil 1000 Mg Caps (Omega-3 fatty acids) .... Once daily 14)  Hydrocodone-acetaminophen 10-325 Mg Tabs (Hydrocodone-acetaminophen) .[name].. 1 by mouth up to 4 times a day by mouth 15)  Aspirin 81 Mg Tbec (Aspirin) .[name].. 1 by mouth qd 16)  Augmentin 875-125 Mg Tabs (Amoxicillin-pot clavulanate) .[name].. 1 by mouth bid 17)  Pamelor 25 Mg Caps (Nortriptyline hcl) .[name].. 1-2 by mouth at bedtime for insomnia  Other Orders: TLB-BMP (Basic Metabolic Panel-BMET) (80048-METABOL)  Patient Instructions: 1)  Please schedule a follow-up appointment in 3 months. 2)  BMP prior to  visit, ICD-9: 3)  HbgA1C prior to visit, ICD-9:790.29 Prescriptions: PAMELOR 25 MG CAPS (NORTRIPTYLINE HCL) 1-2 by mouth at bedtime for insomnia  #60 x 6   Entered and Authorized by:   Tresa Garter MD   Signed by:   Tresa Garter MD on 03/11/2010   Method used:   Print then Mail to Patient   RxID:   [id number] AUGMENTIN 875-125 MG TABS (AMOXICILLIN-POT CLAVULANATE) 1 by mouth bid  #40 x 0   Entered and Authorized by:   Tresa Garter MD   Signed by:   Tresa Garter MD on 03/07/2010   Method used:   Print then Give to Patient   RxID:   [id number] HALCION 0.25 MG TABS (TRIAZOLAM) 1 - 2 at hs as needed insomnia  #60 x 3   Entered and Authorized by:   Tresa Garter MD   Signed by:   Tresa Garter MD on 03/07/2010   Method used:   Print then Give to Patient   RxID:   [id number]    Orders Added: 1)  EMR Misc Charge Code [EMRMisc] 2)  Surgical Referral [Surgery] 3)  TLB-BMP (Basic Metabolic Panel-BMET) [80048-METABOL] 4)  TLB-B12, Serum-Total ONLY [82607-B12] 5)  TLB-A1C / Hgb A1C (Glycohemoglobin) [83036-A1C] 6)  TLB-TSH (Thyroid Stimulating Hormone) [84443-TSH] 7)  Est. Patient Level IV [01027]

## 2010-04-16 ENCOUNTER — Other Ambulatory Visit: Payer: Self-pay | Admitting: Internal Medicine

## 2010-04-24 ENCOUNTER — Telehealth: Payer: Self-pay | Admitting: *Deleted

## 2010-04-24 NOTE — Telephone Encounter (Signed)
Patient requesting refill of abx - She states she has recurrent sinus infection. Last given rx on 2/22 for augmentin.

## 2010-04-25 ENCOUNTER — Telehealth: Payer: Self-pay | Admitting: Internal Medicine

## 2010-04-25 NOTE — Telephone Encounter (Signed)
PA requested for Nexium 04/22/10 PA Form requested [employer] @ [phone] - 04/22/10 PA form received & forwarded to MD for completion 04/23/10 Completed PA form faxed for Approval 04/23/10

## 2010-04-25 NOTE — Telephone Encounter (Signed)
Approval received & faxed to pharmacy @[phone]

## 2010-04-26 NOTE — Telephone Encounter (Signed)
Needs OV. Thank you.

## 2010-04-26 NOTE — Telephone Encounter (Signed)
Patient informed, she will call back to schedule.

## 2010-05-04 ENCOUNTER — Other Ambulatory Visit: Payer: Self-pay | Admitting: Internal Medicine

## 2010-05-04 NOTE — Telephone Encounter (Signed)
Ok to Rf? 

## 2010-05-07 NOTE — Telephone Encounter (Signed)
OK to fill this prescription with additional refills x3 Thank you!  

## 2010-05-23 ENCOUNTER — Other Ambulatory Visit: Payer: Self-pay | Admitting: Internal Medicine

## 2010-05-24 NOTE — Telephone Encounter (Signed)
Ok to Rf? 

## 2010-05-28 ENCOUNTER — Other Ambulatory Visit: Payer: Self-pay | Admitting: Internal Medicine

## 2010-05-28 ENCOUNTER — Other Ambulatory Visit: Payer: Medicare Other

## 2010-05-28 DIAGNOSIS — R7309 Other abnormal glucose: Secondary | ICD-10-CM

## 2010-05-29 NOTE — Op Note (Signed)
[name], [name]        ACCOUNT NO.:  [id number]   MEDICAL RECORD NO.:  [id number]          PATIENT TYPE:  INP   LOCATION:  1533                         FACILITY:  [facility]   PHYSICIAN:  Wilmon Arms. Corliss Skains, M.D. DATE OF BIRTH:  02-12-1960   DATE OF PROCEDURE:  02/07/2007  DATE OF DISCHARGE:                               OPERATIVE REPORT   PREOPERATIVE DIAGNOSIS:  Acute appendicitis.   POSTOPERATIVE DIAGNOSIS:  Acute appendicitis.   PROCEDURE:  Open appendectomy.   SURGEON:  Wilmon Arms. Corliss Skains, M.D., FACS   ASSISTANT:  Sandria Bales. Ezzard Standing, M.D.   ANESTHESIA:  General endotracheal.   INDICATIONS:  The patient is a morbidly obese 50 year old female who is  steroid dependent for sarcoidosis who presents with a 5 day history of  right lower quadrant pain.  She was seen at [facility] urgent care  center on Tuesday and was treated for a urinary tract infection.  Her  pain in her right lower quadrant worsened.  She has had subjective fever  and chills as well as nausea.  She returned to seek medical attention  today at [facility] internal medicine with Dr. Posey Rea.  He sent her to  the [facility] emergency department for further evaluation.  The CT  scan showed inflammation of the right lower quadrant with questionable  evidence of perforated appendicitis.  Her white count was 17.4.  We were  then consulted.   DESCRIPTION OF PROCEDURE:  The patient was brought to the operating room  and placed in a supine position on the operating table with her left arm  tucked.  After an adequate level of general anesthesia was obtained a  Foley catheter was placed under sterile technique.  Her entire abdomen  was prepped with Betadine and draped in a sterile fashion.  A time-out  was taken to ensure the proper patient and proper procedure.  She had  been given preoperative antibiotics.  An area in the midline  approximately 3 cm above the umbilicus was infiltrated with 0.25%  Marcaine.  A  vertical incision was made here.  We dissected down through  an enormous amount of subcutaneous adipose down to the fascia.  The  fascia was opened vertically.  We entered the peritoneal cavity bluntly.  The Hasson cannula was inserted and we insufflated CO2 maintaining a  maximal pressure of 15 mmHg.  However, when we inserted the laparoscope  there seemed to be a lot of omental adhesions preventing [place] from seeing  the small bowel and the cecum.  There was no gross purulence noted.  However, due to the patient's large abdominal girth we were really  unable to obtain good insufflation and had very low working space.  Therefore, the decision was made to convert to an open procedure.   A vertical midline incision was made from midway between the xiphoid and  the umbilicus down to the lower mid abdomen.  Dissection was carried  down through all of the adipose tissue to the fascia.  The fascia was  opened vertically.  The peritoneal cavity was entered.  A Balfour  retractor was inserted and provided some exposure.  There  were adhesions  of the small bowel down into the right lower quadrant.  The cecum was  also adherent in the right lower quadrant.  We broke up some of these  adhesions bluntly with fingers.  There was a lot of fibrinous exudate  which was identified.  This area was cultured and sent to the  microbiology.  We then bluntly dissected the appendix up into the field.  This appeared very inflamed but there was no evidence of perforation.  The mesoappendix was then divided between [facility] clamps and ligated with  2-0 silk sutures.  The base of the appendix at the cecum was divided  with a GIA 55 stapler.  This specimen was sent for pathologic  examination.  We then thoroughly irrigated the right lower quadrant.  We  debrided some of the fibrinous tissue.  A lot of the surrounding fat was  inflamed and thickened but there was no other sign of abscess.  There  was one loop of small  bowel as well as a corner of the sigmoid colon  which had been adherent to the inflamed appendix.  These were carefully  examined.  There was no sign of perforation.  We then examined the small  bowel in retrograde fashion.  There was no dilatation of the small  bowel.  The small bowel as well as the remainder of the colon appeared  normal.  We thoroughly irrigated the abdomen with saline.  The fascia  was then closed with double stranded #1 PDS suture.  Subcutaneous  tissues were thoroughly irrigated.  Staples were used to close the skin.  A dry dressing was applied.  The patient was extubated and brought to  the recovery room in stable condition.  All sponge, instrument and  needle counts were correct.      Wilmon Arms. Tsuei, M.D.  Electronically Signed     MKT/MEDQ  D:  02/07/2007  T:  02/08/2007  Job:  [id number]

## 2010-05-29 NOTE — Telephone Encounter (Signed)
OK to fill this prescription with additional refills x0 Needs OV Thank you!  

## 2010-05-29 NOTE — H&P (Signed)
[name], [name]        ACCOUNT NO.:  [id number]   MEDICAL RECORD NO.:  [id number]          PATIENT TYPE:  INP   LOCATION:  1533                         FACILITY:  [facility]   PHYSICIAN:  Wilmon Arms. Corliss Skains, M.D. DATE OF BIRTH:  1960/11/07   DATE OF ADMISSION:  02/07/2007  DATE OF DISCHARGE:                              HISTORY & PHYSICAL   CHIEF COMPLAINT:  Right lower quadrant pain.   REFERRING PHYSICIAN:  Georgina Quint. Plotnikov, MD.   HISTORY OF PRESENT ILLNESS:  The patient is a morbidly obese 50 year old  female who presents with a 5-day history of right lower quadrant pain.  She was evaluated at the [facility].  She was  diagnosed with a urinary tract infection and was sent home with  ciprofloxacin as well as peridium; however, her pain worsened.  She has  been having subjective fever and chills as well as nausea.  Appetite has  been poor.  Bowel movements have been reportedly normal.  She was seen  by Dr. Posey Rea at the [facility] and was referred to the  Emergency Department for further evaluation.  CT scan showed  inflammation in the right lower quadrant with possible free air  consistent with a perforated appendix.  Her white count is also  elevated.   PAST MEDICAL HISTORY:  1. Sarcoidosis.  2. Hypertension.  3. Gastroesophageal reflux.  4. Anxiety.  5. Depression.   PAST SURGICAL HISTORY:  1. LEEP procedure.  2. Endometrial ablation.  3. Right knee scope.   FAMILY HISTORY:  Congestive heart failure and diabetes in her mother.   SOCIAL HISTORY:  Nonsmoker, nondrinker.   ALLERGIES:  1. ACE INHIBITORS.  2. CODEINE.   MEDICATIONS:  1. Celexa.  2. Hydrochlorothiazide.  3. Klor-Con.  4. Prednisone 20 mg daily.  5. Diazepam.  6. Reglan.  7. Allegra.  8. Vicodin.  9. Carvedilol.   REVIEW OF SYSTEMS:  Otherwise negative.   PHYSICAL EXAMINATION:  VITAL SIGNS:  Temperature 98.7, pulse 76,  respirations 16, blood  pressure 115/71.  Weight 289, per the patient.  GENERAL:  Morbidly obese female who appears mildly uncomfortable.  She  is most comfortable lying on her left side.  HEENT:  Extraocular movements intact.  Sclerae anicteric.  NECK:  No mass, no thyromegaly.  LUNGS:  Clear.  Normal respiratory effort.  HEART:  Regular rate and rhythm.  No murmur.  ABDOMEN:  Tender in the right lower quadrant.  Mild tenderness in the  right upper quadrant.  There is some guarding in the right lower  quadrant.  No rebound.  EXTREMITIES:  No edema.  SKIN:  Warm and dry with no sign of jaundice.   DIAGNOSTIC DATA:  White blood cell count 17.4, hemoglobin 12.6, platelet  count 258,000.  Electrolytes within normal limits.   CT scan shows some right lower quadrant inflammation with question of  some bouts of free air consistent with a perforated appendicitis.   IMPRESSION:  Acute appendicitis, likely perforated.   PLAN:  Recommend immediate laparoscopic appendectomy with possible open  procedure.  I discussed the benefits and risks of procedure with the  patient, including the possible need for open procedure.  We also  discussed the possibility of a subsequent pelvic abscess.  She  understands and wishes to proceed.      Wilmon Arms. Tsuei, M.D.  Electronically Signed     MKT/MEDQ  D:  02/07/2007  T:  02/08/2007  Job:  [id number]

## 2010-05-29 NOTE — Discharge Summary (Signed)
[name], [name]        ACCOUNT NO.:  [id number]   MEDICAL RECORD NO.:  [id number]          PATIENT TYPE:  INP   LOCATION:  1533                         FACILITY:  [facility]   PHYSICIAN:  Wilmon Arms. Corliss Skains, M.D. DATE OF BIRTH:  [birth year]   DATE OF ADMISSION:  02/07/2007  DATE OF DISCHARGE:  02/13/2007                               DISCHARGE SUMMARY   ADMISSION DIAGNOSIS:  Acute appendicitis.   BRIEF HISTORY:  The patient is a 50 year old female who presents with a  5-day history of right lower quadrant pain.  CT scan showed inflammation  of the right lower quadrant consistent with appendicitis.  She was  admitted to the hospital, and was emergently brought to the operating  room.  She underwent an attempt at a laparoscopic appendectomy but this  required conversion due to adhesions. The pathology report confirmed  acute appendicitis.  Postoperatively the patient had a prolonged ileus.  This slowly resolved and she has begun having bowel movements.  Her  midline incision seems to be healing well.  She is being discharged home  with Vicodin p.r.n. for pain.  She is also on Augmentin 875 mg p.o.  b.i.d.  She is to follow up next week for staple removal.      Wilmon Arms. Tsuei, M.D.  Electronically Signed     MKT/MEDQ  D:  02/13/2007  T:  02/13/2007  Job:  [id number]

## 2010-05-29 NOTE — Op Note (Signed)
[name], [name]        ACCOUNT NO.:  [id number]   MEDICAL RECORD NO.:  [id number]          PATIENT TYPE:  AMB   LOCATION:  DAY                          FACILITY:  [facility]   PHYSICIAN:  Georges Lynch. Gioffre, M.D.DATE OF BIRTH:  [birth year]   DATE OF PROCEDURE:  06/05/2006  DATE OF DISCHARGE:                               OPERATIVE REPORT   SURGEON:  Georges Lynch. Darrelyn Hillock, M.D.   ASSISTANT:  Nurse.   PREOPERATIVE DIAGNOSIS:  1. Degenerative arthritis right knee.  2. 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   POSTOPERATIVE DIAGNOSIS:  1. Degenerative arthritis right knee.  2. 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   OPERATION:  1. Diagnostic arthroscopy right knee.  2. Medial meniscectomy right knee.  3. Lateral meniscectomy right knee.  4. Synovectomy suprapatellar pouch right knee.   PROCEDURE:  Under local anesthesia with standby, routine orthopedic prep  and draping of the right lower extremity was carried out.  She had 2  grams of IV Ancef preop.  Note, she had a history of sleep apnea.  She  does not use her mask because she cannot stand closed places.  So, I  discussed the case with anesthesia, we elected to do her knee under  local, observe her, and let her go home this evening.   A small punctate incision was made in the suprapatellar pouch, inflow  cannula was inserted, the knee was distended with saline.  Another small  punctate incision was made in the lateral joint.  The arthroscope was  entered from the lateral approach and a complete diagnostic arthroscopy  was carried out.  We went up into the suprapatellar pouch.  She had  rather significant arthritic changes with severe synovitis.  I  introduced the ArthroCare and did a synovectomy.  I then went down into  the lateral joint space where the lateral meniscus was completely torn  anteriorly. I first observed the popliteus and the posterior horn of the  lateral meniscus  then came up anteriorly and utilized a shaver suction  device with a medial approach to do a partial lateral meniscectomy.  Following that, the ACL was examined.  She had sort of a bipartite ACL,  but it was intact.  I then went down and examined the medial joint space  and she had obvious arthritic changes in the medial joint space and a  partial tear of the medial meniscus.  I introduced a shaver suction  device and did a partial medial meniscectomy.  In observing the lateral  and medial joint, she had rather significant arthritic changes in both  the lateral compartment and the medial compartment.   I thoroughly irrigated out the knee, removed the fluid, closed all three  punctate incisions with 3-0 nylon suture.  I injected 20 mL 0.5%  Marcaine and epinephrine into the joint.  The patient left the OR room  in satisfactory condition after sterile bundle dressing and tape was  applied.  The patient was given 2 grams of IV Ancef preop. She had  Toradol 30 mg IV before leaving the operating room.   FOLLOW-UP CARE:  1. She will be on a walker partial to full weight bearing as      tolerated.  2. She will be on the pulse oximeter in the hospital until she goes      home.  3. She will be on aspirin 325 mg b.i.d. tonight and b.i.d. x2 weeks as      an anticoagulant.  4. She will be on Mepergan Fortis one every 4 hours p.r.n. pain      because she is ALLERGIC TO CODEINE.  5. I will see her in 10-12 days in the office or prior to this if she      has any problems.           ______________________________  Georges Lynch Darrelyn Hillock, M.D.     RAG/MEDQ  D:  06/05/2006  T:  06/05/2006  Job:  [id number]

## 2010-06-01 NOTE — Assessment & Plan Note (Signed)
[facility]                             PRIMARY CARE OFFICE NOTE   [name], [name]                 MRN:          [id number]  DATE:10/10/2005                            DOB:          [birth year]    The patient presents today for an early followup for her severe sarcoidosis.  She has several complaints today.  One, she gets new lesions of sarcoid on  her skin that are extremely painful when touched.  She states they hurt to  the bone.  She can take up to 3 to 4 Darvocet at a time to just get loopy  without eliminating the pain.  She has bad shakes, to the point where she  cannot feed herself.  She is very depressed.  Next, she is unable to sleep  because she is afraid of dying in her sleep.  She stays up all night, goes  to sleep in the morning and wakes up in a couple of hours.  Does have a lot  of panicky feelings.  Obviously she is quite distraught about all of that.   MEDICATIONS:  Current medicines are reviewed.  1. She started on prednisone 15 mg a day.  2. Paxil 20 mg a day.  3. Taking Darvocet as above.  4. Has to use Phenergan to eliminate nausea from the Darvocet.  However,      takes it rarely.  5. She was unable to use Plaquenil in the past due to cost.  6. She uses Protonix samples for her GERD.   SOCIAL HISTORY:  She has not been able to work.  She applied for disability,  and was sent to see a psychiatrist.  Her girlfriend, Dorelle, had to spoon  feed her because of the tremor.   REVIEW OF SYSTEMS:  As above.  Unfortunately, she gained more weight.  The  rest is as above.   PHYSICAL EXAMINATION:  Blood pressure 148/92, pulse 92, temp 97.1, weight  287 pounds (last visit was 272).  She is in no acute distress, appears sad and tearful.  HEENT:  Moist mucosa.  SKIN:  With multiple purple raised lesions, with a new cluster on the  buttocks.  LUNGS:  Clear.  HEART:  Regular.  ABDOMEN:  Obese, soft, nontender.  LOWER  EXTREMITIES:  Without edema.  She is alert, oriented and cooperative.  She is very depressed, not  suicidal, does have a lot of anxiety.   ASSESSMENT AND PLAN:  1. Severe sarcoidosis involving skin.  She does have a cluster of new,      very painful lesions.  I have increased her prednisone to 40 mg daily      to give her a break.  She is aware of calorie restriction.  We      discussed Plaquenil again.  She was not able to take it due to the      cost.  I will see her back in one month.  She may go back to see Dr.      Jimmy Footman.  2. Very painful sarcoid lesions.  She will continue to use Darvocet, no  more than 1 or 2 at a time.  She does not think Phenergan effects her      nausea.  Usually a high dose of prednisone helps with pain a lot.  In      my opinion, she is completely disabled due to her severe sarcoidosis.  3. Severe depression with a history of suicidal attempt.  I will      discontinue Paxil.  We will start Celexa 40 mg daily.  I took her to      our Behavioral Medicine to schedule an appointment.  4. Panic attacks with insomnia.  The temazepam does not seem to work.  We      will discontinue, try diazepam 10 mg t.i.d. to q.i.d. #120 with 3      refills.  I will see her back in a month.  5. Gastroesophageal reflux disease.  Protonix 40 mg daily.  6. Severe tremor.  I think diazepam should be helpful.            ______________________________  Georgina Quint. Plotnikov, MD      AVP/MedQ  DD:  10/10/2005  DT:  10/12/2005  Job #:  [id number]   cc:   Gailen Shelter, MD  Lemmie Evens, M.D.

## 2010-06-01 NOTE — Procedures (Signed)
[name], [name] NO.:  [id number]   MEDICAL RECORD NO.:  [id number]          PATIENT TYPE:  OUT   LOCATION:  SLEEP CENTER                 FACILITY:  [facility]   PHYSICIAN:  Marcelyn Bruins, M.D. [facility] DATE OF BIRTH:  May 25, 1960   DATE OF STUDY:  12/16/2004                              NOCTURNAL POLYSOMNOGRAM   REFERRING PHYSICIAN:  Dr. Danice Goltz.   DATE OF STUDY:  December 16, 2004.   INDICATION FOR THE STUDY:  Hypersomnia with sleep apnea. Epworth score: 4.   SLEEP ARCHITECTURE:  The patient had a total sleep time of 335 minutes with  adequate slow wave sleep as well as REM. Sleep onset latency was normal as  was REM onset. Sleep efficiency was 88%.   RESPIRATORY DATA:  The patient was found to have 71 apneas and 3 hypopneas  for a respiratory disturbance index of 13 events per hour. The events were  not positional but they did occur more commonly in REM. There was moderate  snoring noted.   OXYGEN DATA:  The patient had O2 desaturation as low as 75% with her  obstructive events.   CARDIAC DATA:  No clinically significant cardiac arrhythmia.   MOVEMENT/PARASOMNIA:  The patient was found to have 34 leg jerks with less  than 1 per hour resulting in arousal or awakening.   IMPRESSION/RECOMMENDATION:  Mild obstructive sleep apnea with a respiratory  disturbance index of 13 events per hour and O2 desaturation as low as 75%.  Treatment for this degree of sleep apnea may include weight loss alone,  upper airway surgery, oral appliance, and finally continuous positive airway  pressure.           ______________________________  Marcelyn Bruins, M.D. [facility]  Diplomate, American Board of Sleep  Medicine     KC/MEDQ  D:  12/21/2004 17:07:07  T:  12/22/2004 09:09:39  Job:  [id number]

## 2010-06-01 NOTE — Op Note (Signed)
NAMEKATRINA, [name]        ACCOUNT NO.:  [id number]   MEDICAL RECORD NO.:  [id number]          PATIENT TYPE:  OUT   LOCATION:  CARD                         FACILITY:  [facility]   PHYSICIAN:  Oley Balm. Sung Amabile, M.D. [facility] OF BIRTH:  1960/08/02   DATE OF PROCEDURE:  11/28/2004  DATE OF DISCHARGE:  11/28/2004                                 OPERATIVE REPORT   INDICATIONS:  Shortness of breath.   PROCEDURE:  Cardiopulmonary stress testing was performed on a graded  treadmill well. Testing was stopped due to knee pain. Effort was submaximal.  At peak exercise, oxygen uptake was 11.5 mL/kg per minute or 36% of  predicted maximum indicating severe impairment. When corrected for body  weight, her absolute maximum oxygen uptake is 63% of predicted.   At peak exercise, heart rate was 134 beats per minute or 76% of predicted  maximum indicating that cardiovascular reserve remained. Oxygen pulse was  normal suggesting normal stroke volume. Blood pressure response was normal.  EKG tracings revealed no arrhythmias and no definite ischemia.   At peak exercise, minute ventilation was 41.6 liters per minute or 45% of  maximum voluntary ventilation indicating that ventilatory reserve remained.  Gas exchange parameters revealed no abnormalities. Baseline pulmonary  function test revealed normal spirometry, mild restriction and mild decrease  in diffusion capacity. Postexercise spirometry revealed no exercise-induced  bronchospasm.   SUMMARY:  Severe impairment due to musculoskeletal limitation and obesity.  Submaximal test makes it difficult to definitely rule out cardiopulmonary  pathology. I do suspect that there is a component of deconditioning as well.           ______________________________  Oley Balm. Sung Amabile, M.D. [facility]     DBS/MEDQ  D:  12/26/2004  T:  12/26/2004  Job:  [id number]   cc:   Danice Goltz, M.D. [facility]  [address] [place], [place] [address]   Cardiopulmonary Dept., [facility]

## 2010-06-01 NOTE — Op Note (Signed)
[name], [name] NO.:  [id number]   MEDICAL RECORD NO.:  [id number]          PATIENT TYPE:  AMB   LOCATION:  SDC                           FACILITY:  [facility]   PHYSICIAN:  Ilda Mori, M.D.   DATE OF BIRTH:  1960/10/21   DATE OF PROCEDURE:  12/23/2005  DATE OF DISCHARGE:                               OPERATIVE REPORT   PREOPERATIVE DIAGNOSIS:  Menorrhagia.   POSTOPERATIVE DIAGNOSIS:  Menorrhagia.   PROCEDURE:  Hysteroscopy, NovaSure endometrial ablation.   SURGEON:  Richard D. Arlyce Dice, M.D.   ANESTHESIA:  General.   ESTIMATED BLOOD LOSS:  Minimal.   FINDINGS:  The endometrial cavity appeared normal and symmetrical.  There were no polyps or submucous myomata noted.   SPECIMENS:  None.   INDICATIONS:  This is a 50 year old gravida 1, para 1, female who  complained for over a year of heavy and painful vaginal bleeding.  Options were discussed with the patient, who elected to proceed with the  NovaSure ablation.   PROCEDURE:  The patient was taken to the operating room, placed in a  supine position, and general anesthesia was induced.  She was then  placed in the dorsal lithotomy position and the vagina and perineum were  prepped and draped in a sterile fashion.  Evaluation under anesthesia  revealed a normal-sized uterus and no adnexal masses.  The cervix was  sounded to 3 cm.  The endometrial cavity total was sounded to 9 cm.  The  intrauterine cavity size was calculated at 6 cm.  The cervix was dilated  to a 21 [place].  A diagnostic hysteroscope was introduced and the  uterine cavity was evaluated.  The hysteroscope was removed.  The cervix  was dilated to 25 [place].  A NovaSure ablator was placed in the fundus  and retracted 1.5 cm and deployed.  The NovaSure electrode was then  seated in the endometrial cavity and the final width was 4.3 cm.  The  ablation was then carried out for 80 seconds without complication.  The  procedure was then  terminated and the patient left the operating room in  good condition.      Ilda Mori, M.D.  Electronically Signed     RK/MEDQ  D:  12/23/2005  T:  12/23/2005  Job:  [id number]

## 2010-06-01 NOTE — Assessment & Plan Note (Signed)
[facility]                           PRIMARY CARE OFFICE NOTE   [name], [name]               MRN:          [id number]  DATE:05/02/2006                            DOB:          12-09-1960    REASON:  Preoperative clearance for arthroscopic right knee surgery.   HISTORY:  The patient is a 49 year old female with multiple medical  problems mostly related to a severe form of sarcoidosis, who presents  for surgical clearance.  She continues to have painful skin lesions.  She felt too sleepy with Diazepam and stopped it about a week ago.  Continues to lose weight on a diet.   PAST MEDICAL HISTORY:  1. Sarcoidosis of the lungs and skin, severe.  Chronic steroid      therapy.  2. History of endometrial ablation December 2007.  3. History of lacunar CVA in 2005.  4. Depression, anxiety with history of suicidal attempt.  5. GERD.  6. Insomnia.  7. Low back pain.   CURRENT MEDICATIONS:  1. Celexa 40 mg daily.  2. Diazepam (stopped).  3. BuSpar twice a day.  4. Darvocet q.i.d. p.r.n.  5. Prednisone 5 mg a day at present.  6. HCTZ 25 mg a day.   FAMILY HISTORY:  Mother with congestive heart failure and diabetes.   ALLERGIES:  CODEINE, ACE INHIBITORS, OXYCODONE, AND HYDROCODONE GAVE HER  UPSET STOMACH.   SOCIAL HISTORY:  She lives with her partner, unable to work.  Quit  smoking a while ago, no alcohol.   REVIEW OF SYSTEMS:  No chest pain, chronic pains, painful skin lesions,  fatigue, depressed not suicidal, occasional indigestion.  The rest of  the 18 point review of systems is as above or negative.   PHYSICAL:  Blood pressure 120/83, pulse 80, temperature 96.6, weight 268  pounds (was 269).  HEENT:  With moist mucosa.  NECK:  Supple no thyromegaly or bruits.  LUNGS:  Clear, no wheezes or rales.  HEART:  S1, S2, no murmur, no gallop.  ABDOMEN:  Soft, nontender, no organomegaly.  LOWER EXTREMITIES:  Without edema, right knee  tender with range of  motion.  SKIN:  With sarcoidosis lesions on the face and left ear, no adenopathy.  She was alert, oriented, and cooperative, depressed, not suicidal.   ASSESSMENT/PLAN:  1. Surgical clearance.  She is  clear for surgery, planned before      [facility].  She tells me that Dr. Darrelyn Hillock is planning to send      her home the same day.  I asked her to take 20 mg of prednisone the      day of surgery, and 20 mg the day after(for stress coverage).  ,      then go back to 5 mg a day . Obtained BMET, CBC, urinalysis, TSH;      if all tests are acceptable  - she is clear for surgery.  2. Sarcoidosis with painful lesions.  She has applied for disability.  3. History of hypokalemia.  We will obtain lab work, if low we will      correct.  4. Weight loss on  a diet.  She was encouraged to continue.  5. Gastroesophageal reflux disease, on therapy.  6. Allergies, she is on Allegra D p.r.n.  7. Depression     Aleksei V. Plotnikov, MD  Electronically Signed    AVP/MedQ  DD: 05/02/2006  DT: 05/02/2006  Job #: [id number]   cc:   Windy Fast A. Darrelyn Hillock, M.D.

## 2010-06-04 ENCOUNTER — Ambulatory Visit: Payer: Medicare Other | Admitting: Internal Medicine

## 2010-06-22 ENCOUNTER — Other Ambulatory Visit: Payer: Self-pay | Admitting: Internal Medicine

## 2010-06-27 ENCOUNTER — Other Ambulatory Visit: Payer: Self-pay | Admitting: Internal Medicine

## 2010-06-27 NOTE — Telephone Encounter (Signed)
Ok to Rf? 

## 2010-07-02 ENCOUNTER — Telehealth: Payer: Self-pay | Admitting: *Deleted

## 2010-07-02 MED ORDER — HYDROCODONE-ACETAMINOPHEN 5-325 MG PO TABS: 1.0000 | ORAL_TABLET | Freq: Four times a day (QID) | ORAL | Status: DC | PRN

## 2010-07-02 NOTE — Telephone Encounter (Signed)
OK to fill this prescription with additional refills x1 Thank you!  

## 2010-07-02 NOTE — Telephone Encounter (Signed)
rec rf req for Hydroco/APAP 5-325 1 po qid # 120. Last filled 05-29-10. Ok to Rf?

## 2010-07-06 ENCOUNTER — Other Ambulatory Visit: Payer: Self-pay | Admitting: Internal Medicine

## 2010-07-09 ENCOUNTER — Other Ambulatory Visit (INDEPENDENT_AMBULATORY_CARE_PROVIDER_SITE_OTHER): Payer: Medicare Other

## 2010-07-09 ENCOUNTER — Other Ambulatory Visit: Payer: Self-pay | Admitting: Internal Medicine

## 2010-07-09 DIAGNOSIS — R7309 Other abnormal glucose: Secondary | ICD-10-CM

## 2010-07-09 LAB — HEMOGLOBIN A1C: Hgb A1c MFr Bld: 7 % — ABNORMAL HIGH (ref 4.6–6.5)

## 2010-07-09 LAB — BASIC METABOLIC PANEL
BUN: 16 mg/dL (ref 6–23)
Chloride: 101 mEq/L (ref 96–112)
GFR: 54.18 mL/min — ABNORMAL LOW (ref >60.00)
Potassium: 3.6 mEq/L (ref 3.5–5.1)
Sodium: 137 mEq/L (ref 135–145)

## 2010-07-12 ENCOUNTER — Encounter: Payer: Self-pay | Admitting: Internal Medicine

## 2010-07-16 ENCOUNTER — Ambulatory Visit: Payer: Medicare Other | Admitting: Internal Medicine

## 2010-07-17 ENCOUNTER — Ambulatory Visit (INDEPENDENT_AMBULATORY_CARE_PROVIDER_SITE_OTHER): Payer: Medicare Other | Admitting: Internal Medicine

## 2010-07-17 ENCOUNTER — Encounter: Payer: Self-pay | Admitting: Internal Medicine

## 2010-07-17 DIAGNOSIS — E559 Vitamin D deficiency, unspecified: Secondary | ICD-10-CM

## 2010-07-17 DIAGNOSIS — R030 Elevated blood-pressure reading, without diagnosis of hypertension: Secondary | ICD-10-CM

## 2010-07-17 DIAGNOSIS — F329 Major depressive disorder, single episode, unspecified: Secondary | ICD-10-CM

## 2010-07-17 DIAGNOSIS — E538 Deficiency of other specified B group vitamins: Secondary | ICD-10-CM

## 2010-07-17 DIAGNOSIS — R7309 Other abnormal glucose: Secondary | ICD-10-CM

## 2010-07-17 NOTE — Assessment & Plan Note (Signed)
Loose wt 

## 2010-07-17 NOTE — Assessment & Plan Note (Signed)
She is very inactive - start walking.Loose wt

## 2010-07-17 NOTE — Assessment & Plan Note (Signed)
Restart too

## 2010-07-17 NOTE — Patient Instructions (Signed)
Restart Vit B12 and Vit D Start walking

## 2010-07-17 NOTE — Progress Notes (Signed)
  Subjective:    Patient ID: [name], female    DOB: May 22, 1960, 50 y.o.   MRN: [id number]  HPI  The patient presents for a follow-up of  chronic hypertension, chronic dyslipidemia, type 2 diabetes controlled with medicines   Review of Systems  Constitutional: Negative for chills, activity change, appetite change, fatigue and unexpected weight change.  HENT: Negative for congestion, mouth sores and sinus pressure.   Eyes: Negative for visual disturbance.  Respiratory: Negative for cough and chest tightness.   Gastrointestinal: Negative for nausea and abdominal pain.  Genitourinary: Negative for frequency, difficulty urinating and vaginal pain.  Musculoskeletal: Negative for back pain and gait problem.  Skin: Negative for pallor and rash.  Neurological: Negative for dizziness, tremors, weakness, numbness and headaches.  Psychiatric/Behavioral: Negative for confusion and sleep disturbance.   Wt Readings from Last 3 Encounters:  07/17/10 320 lb (145.151 kg)  03/07/10 305 lb (138.347 kg)  10/26/09 298 lb (135.172 kg)       Objective:   Physical Exam  Constitutional: She appears well-developed and well-nourished. No distress.  HENT:  Head: Normocephalic.  Right Ear: External ear normal.  Left Ear: External ear normal.  Nose: Nose normal.  Mouth/Throat: Oropharynx is clear and moist.  Eyes: Conjunctivae are normal. Pupils are equal, round, and reactive to light. Right eye exhibits no discharge. Left eye exhibits no discharge.  Neck: Normal range of motion. Neck supple. No JVD present. No tracheal deviation present. No thyromegaly present.  Cardiovascular: Normal rate, regular rhythm and normal heart sounds.   Pulmonary/Chest: No stridor. No respiratory distress. She has no wheezes.  Abdominal: Soft. Bowel sounds are normal. She exhibits no distension and no mass. There is no tenderness. There is no rebound and no guarding.  Musculoskeletal: She exhibits no edema and  no tenderness.  Lymphadenopathy:    She has no cervical adenopathy.  Neurological: She displays normal reflexes. No cranial nerve deficit. She exhibits normal muscle tone. Coordination normal.  Skin: No rash noted. No erythema.  Psychiatric: She has a normal mood and affect. Her behavior is normal. Judgment and thought content normal.        Lab Results  Component Value Date   WBC 10.3 08/10/2009   HGB 13.6 08/10/2009   HCT 40.0 08/10/2009   PLT 247.0 08/10/2009   CHOL 193 06/29/2008   TRIG 145.0 06/29/2008   HDL 67.50 06/29/2008   ALT 28 08/10/2009   AST 24 08/10/2009   NA 137 07/09/2010   K 3.6 07/09/2010   CL 101 07/09/2010   CREATININE 1.1 07/09/2010   BUN 16 07/09/2010   CO2 28 07/09/2010   TSH 1.52 03/07/2010   INR 0.8 RATIO 10/22/2006   HGBA1C 7.0* 07/09/2010   MICROALBUR 0.4 06/29/2008     Assessment & Plan:

## 2010-07-17 NOTE — Assessment & Plan Note (Signed)
Re-start Vit B12 Risks associated with treatment noncompliance were discussed. Compliance was encouraged.  

## 2010-07-17 NOTE — Assessment & Plan Note (Signed)
Cont Rx 

## 2010-08-02 ENCOUNTER — Ambulatory Visit: Payer: Medicare Other | Admitting: Internal Medicine

## 2010-08-06 ENCOUNTER — Emergency Department (HOSPITAL_COMMUNITY)
Admission: EM | Admit: 2010-08-06 | Discharge: 2010-08-06 | Disposition: A | Discharge status: Home / Self Care | Payer: Medicare Other | Attending: Emergency Medicine | Admitting: Emergency Medicine

## 2010-08-06 ENCOUNTER — Telehealth: Payer: Self-pay | Admitting: *Deleted

## 2010-08-06 DIAGNOSIS — K219 Gastro-esophageal reflux disease without esophagitis: Secondary | ICD-10-CM | POA: Insufficient documentation

## 2010-08-06 DIAGNOSIS — I10 Essential (primary) hypertension: Secondary | ICD-10-CM | POA: Insufficient documentation

## 2010-08-06 DIAGNOSIS — D869 Sarcoidosis, unspecified: Secondary | ICD-10-CM | POA: Insufficient documentation

## 2010-08-06 DIAGNOSIS — IMO0002 Reserved for concepts with insufficient information to code with codable children: Secondary | ICD-10-CM | POA: Insufficient documentation

## 2010-08-06 NOTE — Telephone Encounter (Signed)
Pt left ER today - she was told to f/u this week w/PCP. She has boil that was packed and possibly needs to be re-packed. OK for same day apt Friday?

## 2010-08-06 NOTE — Telephone Encounter (Signed)
If it is packed she needs to see any MD tomorrow Thx

## 2010-08-07 NOTE — Telephone Encounter (Signed)
Left mess for patient to call back.  

## 2010-08-07 NOTE — Telephone Encounter (Signed)
I called pt and advised we can see in office tom. She states she was told to wait 3-5 days before removing packing. Sched OV on Fri at 4pm.

## 2010-08-09 ENCOUNTER — Encounter: Payer: Self-pay | Admitting: Internal Medicine

## 2010-08-10 ENCOUNTER — Other Ambulatory Visit: Payer: Medicare Other

## 2010-08-10 ENCOUNTER — Encounter: Payer: Self-pay | Admitting: Internal Medicine

## 2010-08-10 ENCOUNTER — Ambulatory Visit (INDEPENDENT_AMBULATORY_CARE_PROVIDER_SITE_OTHER): Payer: Medicare Other | Admitting: Internal Medicine

## 2010-08-10 VITALS — BP 130/80 | HR 84 | Temp 98.1°F | Resp 16 | Wt 326.0 lb

## 2010-08-10 DIAGNOSIS — L02412 Cutaneous abscess of left axilla: Secondary | ICD-10-CM | POA: Insufficient documentation

## 2010-08-10 DIAGNOSIS — IMO0002 Reserved for concepts with insufficient information to code with codable children: Secondary | ICD-10-CM

## 2010-08-10 MED ORDER — SULFAMETHOXAZOLE-TRIMETHOPRIM 800-160 MG PO TABS: 1.0000 | ORAL_TABLET | Freq: Two times a day (BID) | ORAL | Status: DC

## 2010-08-10 MED ORDER — CEFTRIAXONE SODIUM 1 G IJ SOLR
1.0000 g | Freq: Once | INTRAMUSCULAR | Status: AC
  Administered 2010-08-10: 1 g via INTRAMUSCULAR

## 2010-08-10 MED ORDER — MUPIROCIN 2 % EX OINT: TOPICAL_OINTMENT | CUTANEOUS | Status: AC

## 2010-08-10 NOTE — Progress Notes (Signed)
  Subjective:    Patient ID: [name], female    DOB: [birth year], 50 y.o.   MRN: [id number]  HPI  C/o pain in L axilla - severe. A golf size abscess was incised at [facility] ER last Mon and packed with gauze. She was instructed to come today for packing removal. She was chenging dressing w/o pulling out the packing.  Review of Systems  Constitutional: Positive for fever and chills.  Musculoskeletal: Positive for arthralgias.  Psychiatric/Behavioral: The patient is nervous/anxious.        Objective:   Physical Exam  Constitutional: No distress.       NAD Obese  Musculoskeletal: She exhibits edema and tenderness.       L axilla with tender purple 2x1 cm swelling and a wider area of nodular tissue. Copious malodorous drainage is present.          Assessment & Plan:

## 2010-08-10 NOTE — Assessment & Plan Note (Addendum)
Procedure note:  Incision of an abscess and packing removal from the wound cavity; irrigation   Indication : a  collection of pus in the packed abscess    Risks including unsuccessful procedure , possible need for a repeat procedure due to pus accumulation, scar formation, and others as well as benefits were explained to the patient in detail. Written consent was obtained/signed.    The patient was placed in a R decubitus position. The area of an abscess was prepped with povidone-iodine and draped. Local anesthesia with   2   cc of 2% lidocaine and epinephrine  was administered.  I pulled out about 4" of packing and extended the  Incision to 1.5 cm with #11strait blade. About 5-6 cc of purulent malodorouse material was expressed. Cx obtained. The abscess cavity was not explored due to severe pain. The cavity was irrigated with 8 cc of the anesthetic in the syringe and dressed with TELFA and silvadine.   Tolerated well. Complications: None. Pain is better.   Wound instructions provided.   Wound instructions : change dressing once a day or twice a day is needed. Change dressing after  shower in the morning.  Pat dry the wound with gauze. Re-dress wound with antibiotic ointment and Telfa pad or a Band-Aid of appropriate size.   Please contact us if you notice a recollection of pus in the abscess fever and chills increased pain redness red streaks near the abscess increased swelling in the area.

## 2010-08-10 NOTE — Patient Instructions (Signed)
       Wound instructions : change dressing once a day or twice a day is needed. Change dressing after  shower in the morning.  Pat dry the wound with gauze.  Re-dress wound with antibiotic ointment and Telfa pad or a Band-Aid of appropriate size.   Please contact us if you notice a recollection of pus in the abscess fever and chills increased pain redness red streaks near the abscess increased swelling in the area.  

## 2010-08-14 LAB — WOUND CULTURE

## 2010-08-16 ENCOUNTER — Encounter (INDEPENDENT_AMBULATORY_CARE_PROVIDER_SITE_OTHER): Payer: Self-pay | Admitting: Surgery

## 2010-08-16 ENCOUNTER — Ambulatory Visit (INDEPENDENT_AMBULATORY_CARE_PROVIDER_SITE_OTHER): Payer: Medicare Other | Admitting: Surgery

## 2010-08-16 VITALS — BP 122/82 | HR 78 | Temp 96.7°F | Ht 67.0 in | Wt 322.0 lb

## 2010-08-16 DIAGNOSIS — L02412 Cutaneous abscess of left axilla: Secondary | ICD-10-CM

## 2010-08-16 DIAGNOSIS — IMO0002 Reserved for concepts with insufficient information to code with codable children: Secondary | ICD-10-CM

## 2010-08-16 NOTE — Progress Notes (Signed)
Subjective:     Patient ID: [name], female   DOB: November 10, 1960, 50 y.o.   MRN: [id number]  HPI  This a pleasant morbidly obese to steroid-dependent female who had pain in her left arm pit. She went emerge from. She had a drain. She followup with her primary care physician. Dr. Posey Rea did re-incision and drainage. He recommended followup with [place].    The patient continues and oral antibiotics. When the wound closed down and she's not been able to pack it. It is still very tender. She denies any sick contacts & no history. She does not recall if she's ever had MRSA.  Review of Systems  Constitutional: Negative for fever, chills, diaphoresis, appetite change and fatigue.  HENT: Negative for ear pain, sore throat, trouble swallowing, neck pain and ear discharge.   Eyes: Negative for photophobia, discharge and visual disturbance.  Respiratory: Negative for cough, choking, chest tightness and shortness of breath.   Cardiovascular: Negative for chest pain and palpitations.  Gastrointestinal: Negative for nausea, vomiting, abdominal pain, diarrhea, constipation, anal bleeding and rectal pain.  Genitourinary: Negative for dysuria, frequency and difficulty urinating.  Musculoskeletal: Negative for myalgias and gait problem.  Skin: Positive for color change and wound. Negative for pallor and rash.  Neurological: Negative for dizziness, speech difficulty, weakness and numbness.  Hematological: Negative for adenopathy.  Psychiatric/Behavioral: Negative for confusion and agitation. The patient is not nervous/anxious.        Objective:   Physical Exam  Constitutional: She is oriented to person, place, and time. She appears well-developed and well-nourished. No distress.  HENT:  Head: Normocephalic.  Mouth/Throat: Oropharynx is clear and moist. No oropharyngeal exudate.  Eyes: Conjunctivae and EOM are normal. Pupils are equal, round, and reactive to light. No scleral icterus.  Neck:  Normal range of motion. Neck supple. No tracheal deviation present.  Cardiovascular: Normal rate, regular rhythm and intact distal pulses.   Pulmonary/Chest: Effort normal and breath sounds normal. No respiratory distress. She has no wheezes. She has no rales. She exhibits no tenderness.       1 x 2 cm indurated area and left axilla. 6 mm incision nearly closed with tiny opening. 3 x 5 cm area of induration and tenderness more deeply. Very sensitive. Red necrotic pus expressed with pressure.  I recommended better incision and drainage. Informed consent was given. I used local anesthetic.  I made a 1 x2cm opening. I evacuated moderate volume of old cyst wall & cheesy material within it as well. Final wound is about 2 cm deep with the some undermining especially laterally and inferiorly.  I packed with 1/2" wide iodoform Nu Gauze. She tolerated it well  Abdominal: Soft. She exhibits no distension and no mass. There is no tenderness. Hernia confirmed negative in the right inguinal area and confirmed negative in the left inguinal area.  Musculoskeletal: Normal range of motion. She exhibits no tenderness.  Lymphadenopathy:    She has no cervical adenopathy.       Right: No inguinal adenopathy present.       Left: No inguinal adenopathy present.  Neurological: She is alert and oriented to person, place, and time. No cranial nerve deficit. She exhibits normal muscle tone. Coordination normal.  Skin: Skin is warm and dry. No rash noted. She is not diaphoretic. No erythema.  Psychiatric: She has a normal mood and affect. Her behavior is normal. Judgment and thought content normal.       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 Better drain with larger wound.   Plan:     Continue sulfa antibiotic.  Gradually pull out week over weekend and trim out. Will need daily dressings until wound fully closed. The wound needs to be packed. She was concerned she wouldn't be  able to do it and she is debating whether or not to just come back to the office for dressing changes. Now she thinks that her sister can do it.   Return to clinic in next week. Call if worse.

## 2010-08-16 NOTE — Patient Instructions (Signed)
Dressing Change     Dressings are placed over wounds to keep them clean, dry, and protected from further injury. This provides an environment that favors wound healing. Good wound care includes resting and elevating the injured part until the pain and swelling are better. Change your wound dressing as recommended by your caregiver.     When removing an old dressing, lift it slowly away from the wound. If the dressing sticks to the wound, dampen it with half-strength peroxide or tap water. Clean the wound gently with a moist cloth, remove any loose material, and apply antibiotic ointment if recommended by your caregiver. Usually it is okay for a wound to get wet. Wash it with mildly soapy water. Watch for signs of infection when changing a dressing.     SEEK MEDICAL CARE IF YOU DEVELOP:   Increased pain, redness, or swelling.   Pus-like drainage from the wound.    Fever greater than 100.4 F (38 C).     Document Released: 02/08/2004  Document Re-Released: 04/09/2007  [employer] Patient Information 2011 [employer].

## 2010-08-18 ENCOUNTER — Emergency Department (HOSPITAL_COMMUNITY)
Admission: EM | Admit: 2010-08-18 | Discharge: 2010-08-18 | Disposition: A | Discharge status: Home / Self Care | Payer: Medicare Other | Attending: Emergency Medicine | Admitting: Emergency Medicine

## 2010-08-18 DIAGNOSIS — IMO0002 Reserved for concepts with insufficient information to code with codable children: Secondary | ICD-10-CM | POA: Insufficient documentation

## 2010-08-18 DIAGNOSIS — Z4801 Encounter for change or removal of surgical wound dressing: Secondary | ICD-10-CM | POA: Insufficient documentation

## 2010-08-20 ENCOUNTER — Telehealth (INDEPENDENT_AMBULATORY_CARE_PROVIDER_SITE_OTHER): Payer: Self-pay | Admitting: General Surgery

## 2010-08-20 NOTE — Telephone Encounter (Signed)
Patient had a cyst lanced by Dr Michaell Cowing last Thursday, she thought her sister could do dressing changes but she couldn't so the patient went to the ER over the weekend and had changed. This area needs changed again today, patient requesting to come into office. Patient doesn't not work or drive and is homebound. Can we order home health nursing visits for patient? Please advise.

## 2010-08-21 ENCOUNTER — Encounter (INDEPENDENT_AMBULATORY_CARE_PROVIDER_SITE_OTHER): Payer: Medicare Other

## 2010-08-21 ENCOUNTER — Telehealth (INDEPENDENT_AMBULATORY_CARE_PROVIDER_SITE_OTHER): Payer: Self-pay

## 2010-08-21 ENCOUNTER — Telehealth (INDEPENDENT_AMBULATORY_CARE_PROVIDER_SITE_OTHER): Payer: Self-pay | Admitting: General Surgery

## 2010-08-21 DIAGNOSIS — L02419 Cutaneous abscess of limb, unspecified: Secondary | ICD-10-CM

## 2010-08-21 NOTE — Telephone Encounter (Signed)
Called [facility] to see if pt had orders with them to do daily dressing changes but they do not have a account set up with pt yet. I told them what I needed and they told me to fax the info to [facility]./ [facility]

## 2010-08-21 NOTE — Telephone Encounter (Signed)
Pt may have home health set up

## 2010-08-23 ENCOUNTER — Ambulatory Visit (INDEPENDENT_AMBULATORY_CARE_PROVIDER_SITE_OTHER): Payer: Medicare Other | Admitting: Surgery

## 2010-08-23 ENCOUNTER — Encounter (INDEPENDENT_AMBULATORY_CARE_PROVIDER_SITE_OTHER): Payer: Self-pay | Admitting: Surgery

## 2010-08-23 DIAGNOSIS — L02412 Cutaneous abscess of left axilla: Secondary | ICD-10-CM

## 2010-08-23 DIAGNOSIS — IMO0002 Reserved for concepts with insufficient information to code with codable children: Secondary | ICD-10-CM

## 2010-08-23 NOTE — Patient Instructions (Signed)
Daily dressing changes only.  OK to shower/wash wound with soap & water

## 2010-08-23 NOTE — Progress Notes (Signed)
Subjective:     Patient ID: [name], female   DOB: Jan 07, 1961, 50 y.o.   MRN: [id number]  HPI   This a pleasant morbidly obese to steroid-dependent female who had pain in her left arm pit. She went to the ED, where they drained it. She had followup with her primary care physician. Dr. Posey Rea did re-incision and drainage. He recommended followup with [place].   The wound closed down again and she had more pain. I did a larger incision and drainage last week.  She feels better.  She was going to have her sister to the packing, however, her sister did not feel comfortable with this. The patient cannot handle doing herself. Therefore we arranged home health packing  She's getting home health packing b.i.d. I had recommended just daily.  Overall she feels much better. The patient continues oral antibiotics.   Review of Systems  Constitutional: Negative for fever, chills, diaphoresis, appetite change and fatigue.  HENT: Negative for ear pain, sore throat, trouble swallowing, neck pain and ear discharge.   Eyes: Negative for photophobia, discharge and visual disturbance.  Respiratory: Negative for cough, choking, chest tightness and shortness of breath.   Cardiovascular: Negative for chest pain and palpitations.  Gastrointestinal: Negative for nausea, vomiting, abdominal pain, diarrhea, constipation, anal bleeding and rectal pain.  Genitourinary: Negative for dysuria, frequency and difficulty urinating.  Musculoskeletal: Negative for myalgias and gait problem.  Skin: Positive for wound. Negative for color change, pallor and rash.  Neurological: Negative for dizziness, speech difficulty, weakness and numbness.  Hematological: Negative for adenopathy.  Psychiatric/Behavioral: Negative for confusion and agitation. The patient is not nervous/anxious.        Objective:   Physical Exam  Constitutional: She is oriented to person, place, and time. She appears well-developed and  well-nourished. No distress.  HENT:  Head: Normocephalic.  Mouth/Throat: Oropharynx is clear and moist. No oropharyngeal exudate.  Eyes: Conjunctivae and EOM are normal. Pupils are equal, round, and reactive to light. No scleral icterus.  Neck: Normal range of motion. Neck supple. No tracheal deviation present.  Cardiovascular: Normal rate, regular rhythm and intact distal pulses.   Pulmonary/Chest: Effort normal and breath sounds normal. No respiratory distress. She has no wheezes. She has no rales. She exhibits no tenderness.       1 x 2 cm indurated area, reduced.  Wound 1x1x.5cm.  Good granulation at base.  Abdominal: Soft. She exhibits no distension and no mass. There is no tenderness. Hernia confirmed negative in the right inguinal area and confirmed negative in the left inguinal area.  Musculoskeletal: Normal range of motion. She exhibits no tenderness.  Lymphadenopathy:    She has no cervical adenopathy.       Right: No inguinal adenopathy present.       Left: No inguinal adenopathy present.  Neurological: She is alert and oriented to person, place, and time. No cranial nerve deficit. She exhibits normal muscle tone. Coordination normal.  Skin: Skin is warm and dry. No rash noted. She is not diaphoretic. No erythema.  Psychiatric: She has a normal mood and affect. Her behavior is normal. Judgment and thought content normal.       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 Better drained with larger wound.   Plan:     Continue sulfa antibiotic.  Go down to just once a day packing. Okay to shower into wound. Since neither she nor her family can do it and she  has a lot of difficulty with traveling, I think it is important to continue home health care. This is the third incision and drainage needed to ultimately get this under control..  Return to clinic in 2 weeks. Call if worse.

## 2010-08-30 ENCOUNTER — Other Ambulatory Visit: Payer: Self-pay | Admitting: Internal Medicine

## 2010-08-30 NOTE — Telephone Encounter (Signed)
Please advise on Hydrocodone refill 

## 2010-08-31 ENCOUNTER — Telehealth: Payer: Self-pay | Admitting: *Deleted

## 2010-08-31 NOTE — Telephone Encounter (Signed)
Rf req for Hydroco/APAP 5-325 1po qid prn # 120. Last filled 08-01-10.

## 2010-09-03 NOTE — Telephone Encounter (Signed)
OK to fill this prescription with additional refills x2 Thank you!  

## 2010-09-04 MED ORDER — HYDROCODONE-ACETAMINOPHEN 5-325 MG PO TABS: 1.0000 | ORAL_TABLET | Freq: Four times a day (QID) | ORAL | Status: DC | PRN

## 2010-09-05 ENCOUNTER — Encounter (INDEPENDENT_AMBULATORY_CARE_PROVIDER_SITE_OTHER): Payer: Self-pay | Admitting: Surgery

## 2010-09-05 ENCOUNTER — Ambulatory Visit (INDEPENDENT_AMBULATORY_CARE_PROVIDER_SITE_OTHER): Payer: Medicare Other | Admitting: Surgery

## 2010-09-05 VITALS — BP 96/54 | HR 78 | Temp 96.4°F | Wt 325.0 lb

## 2010-09-05 DIAGNOSIS — IMO0002 Reserved for concepts with insufficient information to code with codable children: Secondary | ICD-10-CM

## 2010-09-05 DIAGNOSIS — L02412 Cutaneous abscess of left axilla: Secondary | ICD-10-CM

## 2010-09-05 NOTE — Patient Instructions (Signed)
Wash armpit twice a day.  Keep area dry with gauze, cotton ball, or powder.

## 2010-09-05 NOTE — Progress Notes (Signed)
Subjective:     Patient ID: [name], female   DOB: 01-11-1961, 50 y.o.   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 She went to the ED, where they drained it. She had followup with her primary care physician. Dr. Posey Rea did re-incision and drainage. He recommended followup with [place].   The wound closed down again and she had more pain. I did a larger incision and drainage 3 weeks ago  She feels better.  She was going to have her sister to the packing, however, her sister did not feel comfortable with this. The patient cannot handle doing herself. Therefore we arranged home health packing  She's getting home health packing b.i.d. I had recommended just daily.  Overall she feels much better off antibiotics.  She has been leaving it open to air per the home health nurse recommendations. Tape irritates her.  She notes that she occasionally gets a foul-smelling her left arm pain as well.   Review of Systems  Constitutional: Negative for fever, chills, diaphoresis, appetite change and fatigue.  HENT: Negative for ear pain, sore throat, trouble swallowing, neck pain and ear discharge.   Eyes: Negative for photophobia, discharge and visual disturbance.  Respiratory: Negative for cough, choking, chest tightness and shortness of breath.   Cardiovascular: Negative for chest pain and palpitations.  Gastrointestinal: Negative for nausea, vomiting, abdominal pain, diarrhea, constipation, anal bleeding and rectal pain.  Genitourinary: Negative for dysuria, frequency and difficulty urinating.  Musculoskeletal: Negative for myalgias and gait problem.  Skin: Positive for wound. Negative for color change, pallor and rash.  Neurological: Negative for dizziness, speech difficulty, weakness and numbness.  Hematological: Negative for adenopathy.  Psychiatric/Behavioral: Negative for confusion and agitation. The patient is not  nervous/anxious.        Objective:   Physical Exam  Constitutional: She is oriented to person, place, and time. She appears well-developed and well-nourished. No distress.  HENT:  Head: Normocephalic.  Mouth/Throat: Oropharynx is clear and moist. No oropharyngeal exudate.  Eyes: Conjunctivae and EOM are normal. Pupils are equal, round, and reactive to light. No scleral icterus.  Neck: Normal range of motion. Neck supple. No tracheal deviation present.  Cardiovascular: Normal rate, regular rhythm and intact distal pulses.   Pulmonary/Chest: Effort normal and breath sounds normal. No respiratory distress. She has no wheezes. She has no rales. She exhibits no tenderness.       Wound 1x1cm.  Very superficial.  Good granulation at base.  Skin wet with mild rash.  Abdominal: Soft. She exhibits no distension and no mass. There is no tenderness. Hernia confirmed negative in the right inguinal area and confirmed negative in the left inguinal area.  Musculoskeletal: Normal range of motion. She exhibits no tenderness.  Lymphadenopathy:    She has no cervical adenopathy.       Right: No inguinal adenopathy present.       Left: No inguinal adenopathy present.  Neurological: She is alert and oriented to person, place, and time. No cranial nerve deficit. She exhibits normal muscle tone. Coordination normal.  Skin: Skin is warm and dry. No rash noted. She is not diaphoretic. No erythema.  Psychiatric: She has a normal mood and affect. Her behavior is normal. Judgment and thought content normal.       Assessment:     Left axillary abscess with chronic cystic cavity s/p I&D.  Closed well.   Plan:  No antibiotic.  Wash twice a day to improve hygiene.  Place gauze/cotton balls under axilla to keep the area dry.  Anti-fungal cream if it does not improve.  Go down to just once a day packing. Okay to shower into wound. Since neither she nor her family can do it and she has a lot of difficulty with  traveling, I think it is important to continue home health care. This is the third incision and drainage needed to ultimately get this under control.   Return to clinic in 2 weeks. Call if worse.

## 2010-09-19 ENCOUNTER — Encounter (INDEPENDENT_AMBULATORY_CARE_PROVIDER_SITE_OTHER): Payer: Medicare Other | Admitting: Surgery

## 2010-09-24 ENCOUNTER — Encounter (INDEPENDENT_AMBULATORY_CARE_PROVIDER_SITE_OTHER): Payer: Self-pay | Admitting: Surgery

## 2010-09-24 ENCOUNTER — Ambulatory Visit (INDEPENDENT_AMBULATORY_CARE_PROVIDER_SITE_OTHER): Payer: Medicare Other | Admitting: Surgery

## 2010-09-24 VITALS — BP 94/66 | HR 88 | Temp 96.9°F | Ht 66.0 in | Wt 326.6 lb

## 2010-09-24 DIAGNOSIS — IMO0002 Reserved for concepts with insufficient information to code with codable children: Secondary | ICD-10-CM

## 2010-09-24 DIAGNOSIS — L02412 Cutaneous abscess of left axilla: Secondary | ICD-10-CM

## 2010-09-24 NOTE — Progress Notes (Signed)
Subjective:     Patient ID: [name], female   DOB: 10/21/1960, 50 y.o.   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 She went to the ED, where they drained it. She had followup with her primary care physician. Dr. Posey Rea did re-incision and drainage. He recommended followup with [place].   The wound closed down again and she had more pain. I did a larger incision and drainage 5 weeks ago  She feels better. She remains off antibiotics.  She has been leaving gauze over the wound. Tape irritates her.     Review of Systems  Constitutional: Negative for fever, chills, diaphoresis, appetite change and fatigue.  HENT: Negative for ear pain, sore throat, trouble swallowing, neck pain and ear discharge.   Eyes: Negative for photophobia, discharge and visual disturbance.  Respiratory: Negative for cough, choking, chest tightness and shortness of breath.   Cardiovascular: Negative for chest pain and palpitations.  Gastrointestinal: Negative for nausea, vomiting, abdominal pain, diarrhea, constipation, anal bleeding and rectal pain.  Genitourinary: Negative for dysuria, frequency and difficulty urinating.  Musculoskeletal: Negative for myalgias and gait problem.  Skin: Positive for wound. Negative for color change, pallor and rash.  Neurological: Negative for dizziness, speech difficulty, weakness and numbness.  Hematological: Negative for adenopathy.  Psychiatric/Behavioral: Negative for confusion and agitation. The patient is not nervous/anxious.        Objective:   Physical Exam  Constitutional: She is oriented to person, place, and time. She appears well-developed and well-nourished. No distress.  HENT:  Head: Normocephalic.  Mouth/Throat: Oropharynx is clear and moist. No oropharyngeal exudate.  Eyes: Conjunctivae and EOM are normal. Pupils are equal, round, and reactive to light. No scleral icterus.  Neck:  Normal range of motion. Neck supple. No tracheal deviation present.  Cardiovascular: Normal rate, regular rhythm and intact distal pulses.   Pulmonary/Chest: Effort normal and breath sounds normal. No respiratory distress. She has no wheezes. She has no rales. She exhibits no tenderness.       Wound 5x69mm.  Very superficial.  Good granulation at base.  Skin less wet with no rash.  Hygiene better  Abdominal: Soft. She exhibits no distension and no mass. There is no tenderness. Hernia confirmed negative in the right inguinal area and confirmed negative in the left inguinal area.  Musculoskeletal: Normal range of motion. She exhibits no tenderness.  Lymphadenopathy:    She has no cervical adenopathy.       Right: No inguinal adenopathy present.       Left: No inguinal adenopathy present.  Neurological: She is alert and oriented to person, place, and time. No cranial nerve deficit. She exhibits normal muscle tone. Coordination normal.  Skin: Skin is warm and dry. No rash noted. She is not diaphoretic. No erythema.  Psychiatric: She has a normal mood and affect. Her behavior is normal. Judgment and thought content normal.       Assessment:     Left axillary abscess with chronic cystic cavity s/p I&D.  Closing.   Plan:     No antibiotic.  Wash once a day to improve hygiene.  Place gauze/cotton balls under axilla to keep the area dry.  Anti-fungal cream if it does not improve. Okay to shower into wound.   Return to clinic in 2-3 weeks. Call if worse.

## 2010-09-24 NOTE — Patient Instructions (Signed)
Shower/wash wound with soap & water.  Keep dry cotton ball or gauze over wound to dry up & close

## 2010-09-27 ENCOUNTER — Encounter: Payer: Self-pay | Admitting: Internal Medicine

## 2010-09-27 ENCOUNTER — Ambulatory Visit (INDEPENDENT_AMBULATORY_CARE_PROVIDER_SITE_OTHER): Payer: Medicare Other | Admitting: Internal Medicine

## 2010-09-27 DIAGNOSIS — F4321 Adjustment disorder with depressed mood: Secondary | ICD-10-CM

## 2010-09-27 DIAGNOSIS — E538 Deficiency of other specified B group vitamins: Secondary | ICD-10-CM

## 2010-09-27 DIAGNOSIS — IMO0002 Reserved for concepts with insufficient information to code with codable children: Secondary | ICD-10-CM

## 2010-09-27 DIAGNOSIS — I10 Essential (primary) hypertension: Secondary | ICD-10-CM

## 2010-09-27 DIAGNOSIS — L02412 Cutaneous abscess of left axilla: Secondary | ICD-10-CM

## 2010-09-27 DIAGNOSIS — K219 Gastro-esophageal reflux disease without esophagitis: Secondary | ICD-10-CM

## 2010-09-27 MED ORDER — KETOCONAZOLE 2 % EX CREA: TOPICAL_CREAM | Freq: Every day | CUTANEOUS | Status: DC

## 2010-09-27 NOTE — Progress Notes (Signed)
  Subjective:    Patient ID: [name], female    DOB: 1960-09-09, 50 y.o.   MRN: [id number]  HPI  C/o low BP on Mon at Dr Michaell Cowing' F/u L axilla abscess  Review of Systems  Constitutional: Negative for chills, activity change, appetite change, fatigue and unexpected weight change.  HENT: Negative for congestion, mouth sores and sinus pressure.   Eyes: Negative for visual disturbance.  Respiratory: Negative for cough and chest tightness.   Gastrointestinal: Negative for nausea and abdominal pain.  Genitourinary: Negative for frequency, difficulty urinating and vaginal pain.  Musculoskeletal: Negative for back pain and gait problem.  Skin: Negative for pallor and rash.  Neurological: Negative for dizziness, tremors, weakness, numbness and headaches.  Psychiatric/Behavioral: Negative for confusion and sleep disturbance.   BP Readings from Last 3 Encounters:  09/27/10 150/80  09/24/10 94/66  09/05/10 96/54   Wt Readings from Last 3 Encounters:  09/27/10 326 lb (147.873 kg)  09/24/10 326 lb 9.6 oz (148.145 kg)  09/05/10 325 lb (147.419 kg)       Objective:   Physical Exam  Constitutional: She appears well-developed and well-nourished. No distress.  HENT:  Head: Normocephalic.  Right Ear: External ear normal.  Left Ear: External ear normal.  Nose: Nose normal.  Mouth/Throat: Oropharynx is clear and moist.  Eyes: Conjunctivae are normal. Pupils are equal, round, and reactive to light. Right eye exhibits no discharge. Left eye exhibits no discharge.  Neck: Normal range of motion. Neck supple. No JVD present. No tracheal deviation present. No thyromegaly present.  Cardiovascular: Normal rate, regular rhythm and normal heart sounds.   Pulmonary/Chest: No stridor. No respiratory distress. She has no wheezes.  Abdominal: Soft. Bowel sounds are normal. She exhibits no distension and no mass. There is no tenderness. There is no rebound and no guarding.  Musculoskeletal: She  exhibits no edema and no tenderness.  Lymphadenopathy:    She has no cervical adenopathy.  Neurological: She displays normal reflexes. No cranial nerve deficit. She exhibits normal muscle tone. Coordination normal.  Skin: No rash noted. No erythema.       3 mm opening L axilla Intertrigo - mild  Psychiatric: She has a normal mood and affect. Her behavior is normal. Judgment and thought content normal.    Lab Results  Component Value Date   WBC 10.3 08/10/2009   HGB 13.6 08/10/2009   HCT 40.0 08/10/2009   PLT 247.0 08/10/2009   CHOL 193 06/29/2008   TRIG 145.0 06/29/2008   HDL 67.50 06/29/2008   ALT 28 08/10/2009   AST 24 08/10/2009   NA 137 07/09/2010   K 3.6 07/09/2010   CL 101 07/09/2010   CREATININE 1.1 07/09/2010   BUN 16 07/09/2010   CO2 28 07/09/2010   TSH 1.52 03/07/2010   INR 0.8 RATIO 10/22/2006   HGBA1C 7.0* 07/09/2010   MICROALBUR 0.4 06/29/2008         Assessment & Plan:

## 2010-09-30 ENCOUNTER — Emergency Department (HOSPITAL_COMMUNITY)
Admission: EM | Admit: 2010-09-30 | Discharge: 2010-10-01 | Disposition: A | Discharge status: Home / Self Care | Payer: Medicare Other | Attending: Emergency Medicine | Admitting: Emergency Medicine

## 2010-09-30 DIAGNOSIS — N390 Urinary tract infection, site not specified: Secondary | ICD-10-CM | POA: Insufficient documentation

## 2010-09-30 DIAGNOSIS — R5383 Other fatigue: Secondary | ICD-10-CM | POA: Insufficient documentation

## 2010-09-30 DIAGNOSIS — I10 Essential (primary) hypertension: Secondary | ICD-10-CM | POA: Insufficient documentation

## 2010-09-30 DIAGNOSIS — R42 Dizziness and giddiness: Secondary | ICD-10-CM | POA: Insufficient documentation

## 2010-09-30 DIAGNOSIS — R5381 Other malaise: Secondary | ICD-10-CM | POA: Insufficient documentation

## 2010-09-30 DIAGNOSIS — L989 Disorder of the skin and subcutaneous tissue, unspecified: Secondary | ICD-10-CM | POA: Insufficient documentation

## 2010-09-30 DIAGNOSIS — Z79899 Other long term (current) drug therapy: Secondary | ICD-10-CM | POA: Insufficient documentation

## 2010-09-30 DIAGNOSIS — D869 Sarcoidosis, unspecified: Secondary | ICD-10-CM | POA: Insufficient documentation

## 2010-09-30 LAB — BASIC METABOLIC PANEL
BUN: 13 mg/dL (ref 6–23)
Chloride: 97 mEq/L (ref 96–112)
Creatinine, Ser: 1.12 mg/dL — ABNORMAL HIGH (ref 0.50–1.10)
GFR calc Af Amer: >60 mL/min (ref >60)
GFR calc non Af Amer: 51 mL/min — ABNORMAL LOW (ref >60)
Glucose, Bld: 196 mg/dL — ABNORMAL HIGH (ref 70–99)

## 2010-09-30 LAB — DIFFERENTIAL
Basophils Absolute: 0 10*3/uL (ref 0.0–0.1)
Basophils Relative: 0 % (ref 0–1)
Monocytes Relative: 12 % (ref 3–12)
Neutro Abs: 13.1 10*3/uL — ABNORMAL HIGH (ref 1.7–7.7)
Neutrophils Relative %: 79 % — ABNORMAL HIGH (ref 43–77)

## 2010-09-30 LAB — GLUCOSE, CAPILLARY
Glucose-Capillary: 208 mg/dL — ABNORMAL HIGH (ref 70–99)
Glucose-Capillary: 193 mg/dL — ABNORMAL HIGH (ref 70–99)

## 2010-09-30 LAB — URINE MICROSCOPIC-ADD ON

## 2010-09-30 LAB — URINALYSIS, ROUTINE W REFLEX MICROSCOPIC
Glucose, UA: 100 mg/dL — AB
Nitrite: NEGATIVE
Specific Gravity, Urine: 1.022 (ref 1.005–1.030)
pH: 6 (ref 5.0–8.0)

## 2010-09-30 LAB — CBC
Hemoglobin: 13.5 g/dL (ref 12.0–15.0)
RBC: 4.25 MIL/uL (ref 3.87–5.11)
WBC: 16.6 10*3/uL — ABNORMAL HIGH (ref 4.0–10.5)

## 2010-10-01 LAB — APTT: aPTT: 27 seconds (ref 24–37)

## 2010-10-02 LAB — URINE CULTURE
Colony Count: >=100000
Culture  Setup Time: 201209170953

## 2010-10-04 LAB — CBC
HCT: 28 — ABNORMAL LOW
HCT: 32.2 — ABNORMAL LOW
HCT: 36.2
HCT: 36.4
Hemoglobin: 11.1 — ABNORMAL LOW
Hemoglobin: 12.5
Hemoglobin: 12.6
MCHC: 34.6
MCHC: 34.9
MCV: 94.7
MCV: 95.1
MCV: 95.2
Platelets: 325
RBC: 3.82 — ABNORMAL LOW
RBC: 3.83 — ABNORMAL LOW
RDW: 13.2
RDW: 13.6
RDW: 13.7
WBC: 7.2

## 2010-10-04 LAB — BASIC METABOLIC PANEL
BUN: 7
CO2: 27
Calcium: 8.3 — ABNORMAL LOW
Calcium: 8.3 — ABNORMAL LOW
Chloride: 101
Creatinine, Ser: 0.8
GFR calc Af Amer: 49 — ABNORMAL LOW
GFR calc non Af Amer: 41 — ABNORMAL LOW
GFR calc non Af Amer: >60
Glucose, Bld: 114 — ABNORMAL HIGH
Glucose, Bld: 146 — ABNORMAL HIGH
Potassium: 3.6
Potassium: 4.6
Potassium: 4.8
Sodium: 136
Sodium: 137

## 2010-10-04 LAB — ANAEROBIC CULTURE

## 2010-10-04 LAB — POCT URINALYSIS DIP (DEVICE)
Ketones, ur: NEGATIVE
Protein, ur: 30 — AB
pH: 7

## 2010-10-04 LAB — URINALYSIS, ROUTINE W REFLEX MICROSCOPIC
Bilirubin Urine: NEGATIVE
Glucose, UA: NEGATIVE
Hgb urine dipstick: NEGATIVE
Ketones, ur: NEGATIVE
Nitrite: NEGATIVE
Specific Gravity, Urine: 1.02
pH: 7

## 2010-10-04 LAB — DIFFERENTIAL
Eosinophils Absolute: 0
Lymphs Abs: 0.4 — ABNORMAL LOW
Monocytes Relative: 1 — ABNORMAL LOW
Neutro Abs: 16.7 — ABNORMAL HIGH
Neutrophils Relative %: 96 — ABNORMAL HIGH

## 2010-10-04 LAB — WOUND CULTURE: Culture: NO GROWTH

## 2010-10-04 LAB — COMPREHENSIVE METABOLIC PANEL
ALT: 68 — ABNORMAL HIGH
BUN: 13
CO2: 28
Calcium: 9.1
Creatinine, Ser: 0.93
GFR calc non Af Amer: >60
Glucose, Bld: 127 — ABNORMAL HIGH
Sodium: 137
Total Protein: 6.5

## 2010-10-04 LAB — LIPASE, BLOOD: Lipase: 14

## 2010-10-10 ENCOUNTER — Other Ambulatory Visit (INDEPENDENT_AMBULATORY_CARE_PROVIDER_SITE_OTHER): Payer: Medicare Other

## 2010-10-10 ENCOUNTER — Other Ambulatory Visit: Payer: Self-pay | Admitting: Internal Medicine

## 2010-10-10 ENCOUNTER — Encounter (INDEPENDENT_AMBULATORY_CARE_PROVIDER_SITE_OTHER): Payer: Medicare Other | Admitting: Surgery

## 2010-10-10 ENCOUNTER — Ambulatory Visit: Payer: Medicare Other

## 2010-10-10 DIAGNOSIS — E559 Vitamin D deficiency, unspecified: Secondary | ICD-10-CM

## 2010-10-10 DIAGNOSIS — E538 Deficiency of other specified B group vitamins: Secondary | ICD-10-CM

## 2010-10-10 DIAGNOSIS — R7309 Other abnormal glucose: Secondary | ICD-10-CM

## 2010-10-10 DIAGNOSIS — R030 Elevated blood-pressure reading, without diagnosis of hypertension: Secondary | ICD-10-CM

## 2010-10-10 LAB — COMPREHENSIVE METABOLIC PANEL
Alkaline Phosphatase: 76 U/L (ref 39–117)
BUN: 12 mg/dL (ref 6–23)
CO2: 26 mEq/L (ref 19–32)
Creatinine, Ser: 1.1 mg/dL (ref 0.4–1.2)
GFR: 55.83 mL/min — ABNORMAL LOW (ref >60.00)
Glucose, Bld: 234 mg/dL — ABNORMAL HIGH (ref 70–99)
Sodium: 139 mEq/L (ref 135–145)
Total Bilirubin: 0.2 mg/dL — ABNORMAL LOW (ref 0.3–1.2)
Total Protein: 7.1 g/dL (ref 6.0–8.3)

## 2010-10-10 LAB — VITAMIN B12: Vitamin B-12: 1058 pg/mL — ABNORMAL HIGH (ref 211–911)

## 2010-10-10 NOTE — Telephone Encounter (Signed)
[name], please, inform patient that all labs are normal except for elev glu. Cont w/wt loss Thx

## 2010-10-11 ENCOUNTER — Ambulatory Visit (INDEPENDENT_AMBULATORY_CARE_PROVIDER_SITE_OTHER): Payer: Medicare Other | Admitting: *Deleted

## 2010-10-11 DIAGNOSIS — Z23 Encounter for immunization: Secondary | ICD-10-CM

## 2010-10-12 NOTE — Telephone Encounter (Signed)
Called pt. No answer/vm.

## 2010-10-15 ENCOUNTER — Encounter: Payer: Self-pay | Admitting: Internal Medicine

## 2010-10-15 NOTE — Assessment & Plan Note (Signed)
Better  

## 2010-10-15 NOTE — Assessment & Plan Note (Signed)
Continue with current prescription therapy as reflected on the Med list.  

## 2010-10-15 NOTE — Assessment & Plan Note (Signed)
Discussed.

## 2010-10-17 ENCOUNTER — Ambulatory Visit: Payer: Medicare Other | Admitting: Internal Medicine

## 2010-10-19 NOTE — Telephone Encounter (Signed)
Pt informed

## 2010-12-09 ENCOUNTER — Other Ambulatory Visit: Payer: Self-pay | Admitting: Internal Medicine

## 2010-12-12 ENCOUNTER — Telehealth: Payer: Self-pay | Admitting: *Deleted

## 2010-12-12 MED ORDER — HYDROCODONE-ACETAMINOPHEN 5-325 MG PO TABS: 1.0000 | ORAL_TABLET | Freq: Four times a day (QID) | ORAL | Status: DC | PRN

## 2010-12-12 NOTE — Telephone Encounter (Signed)
OK to fill this prescription with additional refills x1 ROV Thank you!  

## 2010-12-12 NOTE — Telephone Encounter (Signed)
Rf req for Hydroco/APAP 5-325 mg 1 po qid prn. # 120. Last filled 11.26.12. Ok to Rf?

## 2010-12-17 ENCOUNTER — Ambulatory Visit: Payer: Medicare Other | Admitting: Internal Medicine

## 2010-12-18 ENCOUNTER — Other Ambulatory Visit: Payer: Self-pay | Admitting: Internal Medicine

## 2011-01-19 ENCOUNTER — Other Ambulatory Visit: Payer: Self-pay | Admitting: Internal Medicine

## 2011-02-19 ENCOUNTER — Other Ambulatory Visit: Payer: Self-pay | Admitting: Internal Medicine

## 2011-02-20 ENCOUNTER — Telehealth: Payer: Self-pay | Admitting: *Deleted

## 2011-02-20 NOTE — Telephone Encounter (Signed)
Requested Medications     HYDROcodone-acetaminophen (NORCO) 5-325 MG per tablet [Pharmacy Med Name: HYDROCODONE /ACETAMINOPHEN 5-325 TB]   TAKE 1 TABLET BY MOUTH FOUR TIMES DAILY AS NEEDED   Disp: 120 tablet R: 0 Start: 02/19/2011  Class: Normal   Requested on: 02/19/2011   Originally ordered on: 05/29/2010  Last refill: 01/12/2011

## 2011-02-22 MED ORDER — HYDROCODONE-ACETAMINOPHEN 5-325 MG PO TABS: 1.0000 | ORAL_TABLET | Freq: Four times a day (QID) | ORAL | Status: DC | PRN

## 2011-02-22 NOTE — Telephone Encounter (Signed)
Addended by: Merrilyn Puma on: 02/22/2011 10:40 AM   Modules accepted: Orders

## 2011-02-22 NOTE — Telephone Encounter (Signed)
OK to fill this prescription with additional refills x1 Thank you!  

## 2011-03-08 ENCOUNTER — Other Ambulatory Visit: Payer: Self-pay | Admitting: Internal Medicine

## 2011-05-01 ENCOUNTER — Other Ambulatory Visit: Payer: Self-pay | Admitting: Internal Medicine

## 2011-05-09 ENCOUNTER — Other Ambulatory Visit (INDEPENDENT_AMBULATORY_CARE_PROVIDER_SITE_OTHER): Payer: Medicare Other

## 2011-05-09 ENCOUNTER — Telehealth: Payer: Self-pay | Admitting: Internal Medicine

## 2011-05-09 ENCOUNTER — Ambulatory Visit (INDEPENDENT_AMBULATORY_CARE_PROVIDER_SITE_OTHER): Payer: Medicare Other | Admitting: Internal Medicine

## 2011-05-09 ENCOUNTER — Encounter: Payer: Self-pay | Admitting: Internal Medicine

## 2011-05-09 ENCOUNTER — Ambulatory Visit: Payer: Medicare Other | Admitting: Internal Medicine

## 2011-05-09 VITALS — BP 114/80 | HR 80 | Temp 97.3°F | Resp 16 | Wt 285.0 lb

## 2011-05-09 DIAGNOSIS — F4321 Adjustment disorder with depressed mood: Secondary | ICD-10-CM

## 2011-05-09 DIAGNOSIS — R631 Polydipsia: Secondary | ICD-10-CM | POA: Insufficient documentation

## 2011-05-09 DIAGNOSIS — E538 Deficiency of other specified B group vitamins: Secondary | ICD-10-CM

## 2011-05-09 DIAGNOSIS — I10 Essential (primary) hypertension: Secondary | ICD-10-CM

## 2011-05-09 DIAGNOSIS — R7309 Other abnormal glucose: Secondary | ICD-10-CM

## 2011-05-09 DIAGNOSIS — E559 Vitamin D deficiency, unspecified: Secondary | ICD-10-CM | POA: Diagnosis not present

## 2011-05-09 DIAGNOSIS — E1165 Type 2 diabetes mellitus with hyperglycemia: Secondary | ICD-10-CM

## 2011-05-09 DIAGNOSIS — F329 Major depressive disorder, single episode, unspecified: Secondary | ICD-10-CM

## 2011-05-09 LAB — URINALYSIS, ROUTINE W REFLEX MICROSCOPIC
Hgb urine dipstick: NEGATIVE
Ketones, ur: 15
Specific Gravity, Urine: <=1.005 (ref 1.000–1.030)
Total Protein, Urine: NEGATIVE
Urine Glucose: >=1000

## 2011-05-09 LAB — HEPATIC FUNCTION PANEL
ALT: 77 U/L — ABNORMAL HIGH (ref 0–35)
AST: 62 U/L — ABNORMAL HIGH (ref 0–37)
Alkaline Phosphatase: 156 U/L — ABNORMAL HIGH (ref 39–117)
Bilirubin, Direct: 0.1 mg/dL (ref 0.0–0.3)
Total Bilirubin: 0.8 mg/dL (ref 0.3–1.2)
Total Protein: 7.5 g/dL (ref 6.0–8.3)

## 2011-05-09 LAB — CBC WITH DIFFERENTIAL/PLATELET
Basophils Relative: 0.3 % (ref 0.0–3.0)
Eosinophils Absolute: 0.1 10*3/uL (ref 0.0–0.7)
HCT: 45.8 % (ref 36.0–46.0)
Hemoglobin: 15.6 g/dL — ABNORMAL HIGH (ref 12.0–15.0)
Lymphocytes Relative: 26.9 % (ref 12.0–46.0)
Lymphs Abs: 1.9 10*3/uL (ref 0.7–4.0)
MCHC: 34 g/dL (ref 30.0–36.0)
Neutro Abs: 4.6 10*3/uL (ref 1.4–7.7)
RBC: 4.63 Mil/uL (ref 3.87–5.11)
RDW: 13.2 % (ref 11.5–14.6)

## 2011-05-09 LAB — BASIC METABOLIC PANEL
CO2: 27 mEq/L (ref 19–32)
Calcium: 9.6 mg/dL (ref 8.4–10.5)
Chloride: 90 mEq/L — ABNORMAL LOW (ref 96–112)
Glucose, Bld: 527 mg/dL (ref 70–99)
Potassium: 4.1 mEq/L (ref 3.5–5.1)
Sodium: 133 mEq/L — ABNORMAL LOW (ref 135–145)

## 2011-05-09 LAB — VITAMIN B12: Vitamin B-12: 935 pg/mL — ABNORMAL HIGH (ref 211–911)

## 2011-05-09 MED ORDER — METFORMIN HCL 1000 MG PO TABS: 1000.0000 mg | ORAL_TABLET | Freq: Two times a day (BID) | ORAL | Status: DC

## 2011-05-09 NOTE — Assessment & Plan Note (Signed)
Endocr consult

## 2011-05-09 NOTE — Assessment & Plan Note (Signed)
4/13-severe

## 2011-05-09 NOTE — Assessment & Plan Note (Addendum)
Start Metformin Booklet given; Freestyle lite Humalog 10 u

## 2011-05-09 NOTE — Assessment & Plan Note (Signed)
Continue with current prescription therapy as reflected on the Med list.  

## 2011-05-09 NOTE — Progress Notes (Signed)
Patient ID: [name], female   DOB: 03-24-1960, 51 y.o.   MRN: [id number]  Subjective:    Patient ID: [name], female    DOB: 1960/10/08, 51 y.o.   MRN: [id number]  Dysuria  Associated symptoms include frequency and urgency. Pertinent negatives include no chills or nausea.    The patient presents for a follow-up of  chronic hypertension, chronic dyslipidemia, type 2 diabetes controlled with medicines  Wt Readings from Last 3 Encounters:  05/09/11 285 lb (129.275 kg)  09/27/10 326 lb (147.873 kg)  09/24/10 326 lb 9.6 oz (148.145 kg)   BP Readings from Last 3 Encounters:  05/09/11 114/80  09/27/10 150/80  09/24/10 94/66      Review of Systems  Constitutional: Positive for fatigue and unexpected weight change. Negative for chills, activity change and appetite change.  HENT: Negative for congestion, mouth sores and sinus pressure.   Eyes: Negative for visual disturbance.  Respiratory: Negative for cough and chest tightness.   Gastrointestinal: Negative for nausea and abdominal pain.  Genitourinary: Positive for urgency and frequency. Negative for dysuria, difficulty urinating and vaginal pain.  Musculoskeletal: Negative for back pain and gait problem.  Skin: Negative for pallor and rash.  Neurological: Negative for dizziness, tremors, weakness, numbness and headaches.  Psychiatric/Behavioral: Positive for sleep disturbance. Negative for suicidal ideas, hallucinations, confusion and agitation. The patient is nervous/anxious.         Objective:   Physical Exam  Constitutional: She appears well-developed. No distress.       Obese   HENT:  Head: Normocephalic.  Right Ear: External ear normal.  Left Ear: External ear normal.  Nose: Nose normal.  Mouth/Throat: Oropharynx is clear and moist. No oropharyngeal exudate.       Caries; missing front teeth  Eyes: Conjunctivae are normal. Pupils are equal, round, and reactive to light. Right eye exhibits no  discharge. Left eye exhibits no discharge.  Neck: Normal range of motion. Neck supple. No JVD present. No tracheal deviation present. No thyromegaly present.  Cardiovascular: Normal rate, regular rhythm and normal heart sounds.   Pulmonary/Chest: No stridor. No respiratory distress. She has no wheezes.  Abdominal: Soft. Bowel sounds are normal. She exhibits no distension and no mass. There is no tenderness. There is no rebound and no guarding.  Musculoskeletal: She exhibits no edema and no tenderness.  Lymphadenopathy:    She has no cervical adenopathy.  Neurological: She displays normal reflexes. No cranial nerve deficit. She exhibits normal muscle tone. Coordination normal.  Skin: No rash noted. No erythema.  Psychiatric: She has a normal mood and affect. Her behavior is normal. Judgment and thought content normal.        Lab Results  Component Value Date   WBC 16.6* 09/30/2010   HGB 13.5 09/30/2010   HCT 41.2 09/30/2010   PLT 189 09/30/2010   CHOL 193 06/29/2008   TRIG 145.0 06/29/2008   HDL 67.50 06/29/2008   ALT 40* 10/10/2010   AST 35 10/10/2010   NA 139 10/10/2010   K 4.2 10/10/2010   CL 101 10/10/2010   CREATININE 1.1 10/10/2010   BUN 12 10/10/2010   CO2 26 10/10/2010   TSH 1.52 03/07/2010   INR 1.01 10/01/2010   HGBA1C 7.3* 10/10/2010   MICROALBUR 0.4 06/29/2008     Assessment & Plan:   A complex case. Severe hyperglycemia. Risks associated with treatment noncompliance were discussed. Compliance was encouraged. To ER if worse

## 2011-05-09 NOTE — Assessment & Plan Note (Signed)
Check B12 

## 2011-05-09 NOTE — Assessment & Plan Note (Signed)
-   check VitD

## 2011-05-09 NOTE — Assessment & Plan Note (Signed)
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

## 2011-05-09 NOTE — Assessment & Plan Note (Signed)
Discussed - x 2 years

## 2011-05-09 NOTE — Telephone Encounter (Signed)
[name], please, inform patient that her diabetes is out of control - plan - as we discussed Thx

## 2011-05-09 NOTE — Telephone Encounter (Signed)
Pt informed

## 2011-05-10 ENCOUNTER — Telehealth: Payer: Self-pay | Admitting: Internal Medicine

## 2011-05-10 NOTE — Telephone Encounter (Signed)
Pt says metformin is causing vomiting---[facility] [address]--pt ph# [phone]

## 2011-05-10 NOTE — Telephone Encounter (Signed)
Patient having side effects of nausea & vomiting w/Metformin; informed Pt to stop medication & drink lots of water to flush the kidneys. Please advise on alternative Rx.

## 2011-05-10 NOTE — Telephone Encounter (Signed)
Try to take 1/2 tab daily x 2 d, then 1/2 tab  twice a day for a few days to get adjusted - then  1 bid Thx

## 2011-05-13 ENCOUNTER — Telehealth: Payer: Self-pay

## 2011-05-13 MED ORDER — LINAGLIPTIN 5 MG PO TABS: 5.0000 mg | ORAL_TABLET | Freq: Every day | ORAL | Status: DC

## 2011-05-13 MED ORDER — GLUCOSE BLOOD VI STRP: 1.0000 | ORAL_STRIP | Freq: Two times a day (BID) | Status: DC

## 2011-05-13 MED ORDER — FREESTYLE LANCETS MISC: 1.0000 | Freq: Two times a day (BID) | Status: DC

## 2011-05-13 NOTE — Telephone Encounter (Signed)
Pt advised of Rx/samples

## 2011-05-13 NOTE — Telephone Encounter (Signed)
Start Tradjenta 5 mg 1 a day Pick up samples Thx

## 2011-05-13 NOTE — Telephone Encounter (Signed)
[facility] Triage Call Report Triage Record Num: [id number] Operator: Tana Felts Patient Name: [name] Call Date & Time: 05/11/2011 9:18:43AM Patient Phone: [phone] PCP: Sonda Primes Patient Gender: Female PCP Fax : [phone] Patient DOB: 06/22/1960 Practice Name: [facility] Reason for Call: Caller: Terree/Patient; PCP: Sonda Primes; CB#: [phone]; Onset 05/10/11 Call regarding Metformin RX started on 05/09/11, pt started vomiting and diarrhea and called office on 05/10/11. They advised to start on new RX that they would send to pharmacy. Nothing has been received at pharmacy. Looked at EMR via [facility] Sonda Primes, MD 05/10/2011 4:45 PM Signed Instructions: Try to take 1/2 tab daily x 2 d, then 1/2 tab twice a day for a few days to get adjusted - then 1 bid. Instructions given to patient, verbalized understanding. Protocol(s) Used: Medication Questions - Adult Recommended Outcome per Protocol: Provided Health Information Reason for Outcome: Caller has medication question(s) that was answered with available resources Care Advice: ~ 05/11/2011 9:33:05AM Page 1 of 1 [id number]

## 2011-05-13 NOTE — Telephone Encounter (Signed)
See next telephone note ?

## 2011-05-13 NOTE — Telephone Encounter (Signed)
Pt states that she is still experiencing vomiting and diarrhea on 1/2 tab daily of Metformin. Pt is requesting alternate medication, please advise.

## 2011-05-15 ENCOUNTER — Telehealth: Payer: Self-pay | Admitting: *Deleted

## 2011-05-15 NOTE — Telephone Encounter (Signed)
Left mess for patient to call back to see if pt wants to p/u handicap placard or me to mail it to her. Form is on my desk.

## 2011-05-15 NOTE — Telephone Encounter (Signed)
Form mailed to pt per her request.

## 2011-05-21 ENCOUNTER — Encounter: Payer: Self-pay | Admitting: Endocrinology

## 2011-05-21 ENCOUNTER — Ambulatory Visit (INDEPENDENT_AMBULATORY_CARE_PROVIDER_SITE_OTHER): Payer: Medicare Other | Admitting: Endocrinology

## 2011-05-21 VITALS — BP 112/72 | HR 85 | Temp 97.5°F | Ht 66.0 in | Wt 287.0 lb

## 2011-05-21 DIAGNOSIS — H538 Other visual disturbances: Secondary | ICD-10-CM

## 2011-05-21 DIAGNOSIS — R7309 Other abnormal glucose: Secondary | ICD-10-CM | POA: Diagnosis not present

## 2011-05-21 DIAGNOSIS — F329 Major depressive disorder, single episode, unspecified: Secondary | ICD-10-CM

## 2011-05-21 DIAGNOSIS — E1165 Type 2 diabetes mellitus with hyperglycemia: Secondary | ICD-10-CM

## 2011-05-21 NOTE — Progress Notes (Signed)
Subjective:    Patient ID: [name], female    DOB: 1960/09/14, 51 y.o.   MRN: [id number]  HPI pt states few months of moderate blurry vision from both eyes, and assoc fatigue. pt says her diet and exercise are poor.  In particular, activity is limited by medical conditions.  She was rx'ed metformin, but stopped it due to n/v/d.  She was started on tradjenta, and sxs have not recurred on this. cbg's vary from 250-500.  she is hesitant to start insulin.   Past Medical History  Diagnosis Date  . Abdominal pain     rlq  . Urinary tract infection, site not specified   . Sinusitis   . Osteoarthritis   . Sarcoidosis     Dr. Wilford Grist  . Esophageal reflux   . Depression   . Anxiety   . Vitamin d deficiency 2009  . Vitamin B 12 deficiency 2009  . Ruptured appendix   . Hypertension   . OSA (obstructive sleep apnea)   . Allergic rhinitis   . ABDOMINAL PAIN RIGHT LOWER QUADRANT 02/07/2007  . ALLERGIC RHINITIS 10/26/2009  . ANXIETY 08/14/2006  . B12 DEFICIENCY 08/25/2007  . DEPRESSION 08/14/2006  . DYSPNEA 04/28/2009  . ELEVATED BP 07/10/2007  . GRIEF REACTION 08/01/2009  . HYPERGLYCEMIA 12/30/2008  . HYPERTENSION 08/27/2007  . INSOMNIA, CHRONIC 03/07/2010  . NAUSEA 03/07/2010  . OBESITY 03/07/2010  . VISUAL CHANGES 07/02/2007  . VITAMIN D DEFICIENCY 08/25/2007  . Shortness of breath   . Family history of breast cancer     sister    Past Surgical History  Procedure Date  . Arthroscopic knee surgery 11-12-04    L  . Appendectomy   . Endometrial ablation   . Axillary abcess irrigation and debridement Jul & Aug2012    History   Social History  . Marital Status: Single    Spouse Name: N/A    Number of Children: N/A  . Years of Education: N/A   Occupational History  . disabled    Social History Main Topics  . Smoking status: Former Games developer  . Smokeless tobacco: Not on file  . Alcohol Use: No  . Drug Use: No  . Sexually Active: Not Currently   Other Topics Concern    . Not on file   Social History Narrative   Single, broke up with partner in 2008.    Current Outpatient Prescriptions on File Prior to Visit  Medication Sig Dispense Refill  . aspirin EC 81 MG tablet Take 81 mg by mouth daily.        . busPIRone (BUSPAR) 10 MG tablet TAKE 1 TABLET BY MOUTH TWICE DAILY  180 tablet  1  . carvedilol (COREG) 12.5 MG tablet TAKE 1 TABLET BY MOUTH TWICE DAILY  180 tablet  1  . Cholecalciferol (VITAMIN D3) 1000 UNITS tablet Take 2,000 Units by mouth daily.        . citalopram (CELEXA) 40 MG tablet TAKE 1 &1/2 TABLETS BY MOUTH DAILY  135 tablet  1  . Cyanocobalamin (VITAMIN B-12 CR) 1000 MCG TBCR Take 500 mcg by mouth daily.        . fexofenadine (ALLEGRA) 180 MG tablet Take 180 mg by mouth as needed.        . fexofenadine-pseudoephedrine (ALLEGRA-D) 60-120 MG per tablet Take 1 tablet by mouth 2 (two) times daily.        [name] glucose blood (FREESTYLE LITE) test strip 1 each by Other route 2 (two) times  daily. Use as instructed  100 each  12  . HYDROcodone-acetaminophen (NORCO) 5-325 MG per tablet Take 1 tablet by mouth 4 (four) times daily as needed for pain.  120 tablet  1  . ketoconazole (NIZORAL) 2 % cream Apply topically daily.  45 g  1  . Lancets (FREESTYLE) lancets 1 each by Other route 2 (two) times daily. Use as instructed  100 each  12  . linagliptin (TRADJENTA) 5 MG TABS tablet Take 1 tablet (5 mg total) by mouth daily.  30 tablet  11  . metoCLOPramide (REGLAN) 10 MG tablet TAKE 1 TABLET BY MOUTH THREE TIMES DAILY BEFORE A MEAL  270 tablet  1  . NEXIUM 40 MG capsule TAKE ONE CAPSULE BY MOUTH EVERY MORNING  90 capsule  1  . nortriptyline (PAMELOR) 25 MG capsule TAKE 1 TO 2 CAPSULES BY MOUTH AT BEDTIME FOR INSOMNIA  60 capsule  5  . Omega-3 Fatty Acids (FISH OIL) 1000 MG CAPS Take by mouth daily.        . potassium chloride (K-DUR,KLOR-CON) 10 MEQ tablet TAKE 1 TABLET BY MOUTH EVERY DAY  90 tablet  1  . predniSONE (DELTASONE) 10 MG tablet TAKE 1 TO 2 TABLETS  BY MOUTH EVERY DAY AS DIRECTED  180 tablet  3  . triamcinolone (KENALOG) 0.5 % cream Apply topically 2 (two) times daily.          Allergies  Allergen Reactions  . Enalapril Maleate     REACTION: cough  . Hydrochlorothiazide     REACTION: Low potassium  . Hydroxychloroquine Sulfate     REACTION: vision changes    Family History  Problem Relation Age of Onset  . Hypertension    . Coronary artery disease      female 1st degree relative <60  . Heart failure      congestive  . Coronary artery disease      Female 1st degree relative <50  . Breast cancer      1st degree relative <50 S  . Heart disease Mother   . Kidney disease Mother   . Cancer Father     leukemia  DM: mother BP 112/72  Pulse 85  Temp(Src) 97.5 F (36.4 C) (Oral)  Ht 5\' 6"  (1.676 m)  Wt 287 lb (130.182 kg)  BMI 46.32 kg/m2  SpO2 96%  Review of Systems denies chest pain, n/v, cramps, excessive diaphoresis, memory loss, menopausal sxs, and rhinorrhea.  She has tingling of the left hand.  She has lost 40 lbs, x a few months.  She has chronic headache, easy bruising, depression, and doe.  She has recent polyuria.      Objective:   Physical Exam VS: see vs page GEN: no distress.  Morbid obesity. HEAD: head: no deformity eyes: no periorbital swelling, no proptosis external nose and ears are normal mouth: no lesion seen NECK: supple, thyroid is not enlarged CHEST WALL: no deformity LUNGS:  Clear to auscultation CV: reg rate and rhythm, no murmur ABD: abdomen is soft, nontender.  no hepatosplenomegaly.  not distended.  no hernia MUSCULOSKELETAL: muscle bulk and strength are grossly normal.  no obvious joint swelling.  gait is normal and steady EXTEMITIES: no deformity.  no ulcer on the feet.  feet are of normal color and temp.  1+ bilat leg edema PULSES: dorsalis pedis intact bilat.  no carotid bruit NEURO:  cn 2-12 grossly intact.   readily moves all 4's.  sensation is intact to touch on the  feet SKIN:   Normal texture and temperature.  No rash or suspicious lesion is visible.   NODES:  None palpable at the neck PSYCH: alert, oriented x3.  Does not appear anxious nor depressed. Lab Results  Component Value Date   HGBA1C 16.4* 05/09/2011      Assessment & Plan:  DM, with severe hyperglycemia.  This causes high risk to her health.  i demonstrated insulin pen injection. Depression.  This complicates the rx of DM Blurry vision, prob due to severe hyperglycemia Morbid obesity.  This also complicates the rx of DM.

## 2011-05-21 NOTE — Patient Instructions (Addendum)
good diet and exercise habits significanly improve the control of your diabetes.  high blood sugar is very risky to your health.  you should see an eye doctor every year. controlling your blood pressure and cholesterol drastically reduces the damage diabetes does to your body.  this also applies to quitting smoking.  please discuss these with your doctor.  you should take an aspirin every day, unless you have been advised by a doctor not to. check your blood sugar once a day.  vary the time of day when you check, between before the 3 meals, and at bedtime.  also check if you have symptoms of your blood sugar being too high or too low.  please keep a record of the readings and bring it to your next appointment here.  please call us sooner if your blood sugar goes below 70, or if it stays over 200.  Refer to a dietician specialist.  you will receive a phone call, about a day and time for an appointment.  Start lantus 20 units each morning.   Please come back for a follow-up appointment in 1 week.

## 2011-05-23 ENCOUNTER — Telehealth: Payer: Self-pay | Admitting: *Deleted

## 2011-05-23 ENCOUNTER — Other Ambulatory Visit: Payer: Self-pay | Admitting: Internal Medicine

## 2011-05-23 NOTE — Telephone Encounter (Signed)
   Requested Medications     HYDROcodone-acetaminophen (NORCO) 5-325 MG per tablet [Pharmacy Med Name: HYDROCODONE/ ACETAMINOPHEN 5-325 TB]    TAKE 1 TABLET BY MOUTH FOUR TIMES DAILY AS NEEDED    Disp: 120 tablet R: 0 Start: 05/23/2011 Class: Normal    Requested on: 05/23/2011    Originally ordered on: 05/29/2010 Last refill: 04/27/2011 Order History and Details

## 2011-05-24 ENCOUNTER — Telehealth: Payer: Self-pay

## 2011-05-24 MED ORDER — HYDROCODONE-ACETAMINOPHEN 5-325 MG PO TABS: 1.0000 | ORAL_TABLET | Freq: Four times a day (QID) | ORAL | Status: DC | PRN

## 2011-05-24 NOTE — Telephone Encounter (Signed)
Pt called stating that she started taking 20u of Lantus qam 3 days ago but her CBGs have still been elevated - 270 - 250 with each reading. Pt is requesting MD advisement.

## 2011-05-24 NOTE — Telephone Encounter (Signed)
OK to fill this prescription with additional refills x1 Thank you!  

## 2011-05-24 NOTE — Telephone Encounter (Signed)
Pt informed of MD's advisement of insulin adjustment.

## 2011-05-24 NOTE — Telephone Encounter (Signed)
Increase to 35 units qd Ret a sched

## 2011-05-24 NOTE — Telephone Encounter (Signed)
Done

## 2011-05-30 ENCOUNTER — Encounter: Payer: Self-pay | Admitting: Endocrinology

## 2011-05-30 ENCOUNTER — Ambulatory Visit (INDEPENDENT_AMBULATORY_CARE_PROVIDER_SITE_OTHER): Payer: Medicare Other | Admitting: Endocrinology

## 2011-05-30 VITALS — BP 132/88 | HR 91 | Temp 96.8°F | Ht 67.0 in | Wt 295.0 lb

## 2011-05-30 DIAGNOSIS — E1165 Type 2 diabetes mellitus with hyperglycemia: Secondary | ICD-10-CM

## 2011-05-30 MED ORDER — INSULIN GLARGINE 100 UNIT/ML ~~LOC~~ SOLN: 50.0000 [IU] | SUBCUTANEOUS | Status: DC

## 2011-05-30 MED ORDER — INSULIN GLARGINE 100 UNIT/ML ~~LOC~~ SOLN: 50.0000 [IU] | Freq: Every day | SUBCUTANEOUS | Status: DC

## 2011-05-30 NOTE — Progress Notes (Signed)
Subjective:    Patient ID: [name], female    DOB: 19-Dec-1960, 51 y.o.   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  she brings a record of her cbg's which i have reviewed today.  Despite increasing the insulin to 35 units qd, cbg's are still 200's-300's.  There is no trend throughout the day.  She anticipates being on the prednisone for at least 2 more months.  Past Medical History  Diagnosis Date  . Abdominal pain     rlq  . Urinary tract infection, site not specified   . Sinusitis   . Osteoarthritis   . Sarcoidosis     Dr. Wilford Grist  . Esophageal reflux   . Depression   . Anxiety   . Vitamin d deficiency 2009  . Vitamin B 12 deficiency 2009  . Ruptured appendix   . Hypertension   . OSA (obstructive sleep apnea)   . Allergic rhinitis   . ABDOMINAL PAIN RIGHT LOWER QUADRANT 02/07/2007  . ALLERGIC RHINITIS 10/26/2009  . ANXIETY 08/14/2006  . B12 DEFICIENCY 08/25/2007  . DEPRESSION 08/14/2006  . DYSPNEA 04/28/2009  . ELEVATED BP 07/10/2007  . GRIEF REACTION 08/01/2009  . HYPERGLYCEMIA 12/30/2008  . HYPERTENSION 08/27/2007  . INSOMNIA, CHRONIC 03/07/2010  . NAUSEA 03/07/2010  . OBESITY 03/07/2010  . VISUAL CHANGES 07/02/2007  . VITAMIN D DEFICIENCY 08/25/2007  . Shortness of breath   . Family history of breast cancer     sister    Past Surgical History  Procedure Date  . Arthroscopic knee surgery 11-12-04    L  . Appendectomy   . Endometrial ablation   . Axillary abcess irrigation and debridement Jul & Aug2012    History   Social History  . Marital Status: Single    Spouse Name: N/A    Number of Children: N/A  . Years of Education: N/A   Occupational History  . disabled    Social History Main Topics  . Smoking status: Former Games developer  . Smokeless tobacco: Not on file  . Alcohol Use: No  . Drug Use: No  . Sexually Active: Not Currently   Other Topics Concern  . Not on file   Social History Narrative   Single, broke up with partner in 2008.    Current Outpatient Prescriptions on File Prior to Visit  Medication Sig Dispense Refill  . aspirin EC 81 MG tablet Take 81 mg by mouth daily.        . busPIRone (BUSPAR) 10 MG tablet TAKE 1 TABLET BY MOUTH TWICE DAILY  180 tablet  1  . carvedilol (COREG) 12.5 MG tablet TAKE 1 TABLET BY MOUTH TWICE DAILY  180 tablet  1  . Cholecalciferol (VITAMIN D3) 1000 UNITS tablet Take 2,000 Units by mouth daily.        . citalopram (CELEXA) 40 MG tablet TAKE 1 &1/2 TABLETS BY MOUTH DAILY  135 tablet  1  . Cyanocobalamin (VITAMIN B-12 CR) 1000 MCG TBCR Take 500 mcg by mouth daily.        . fexofenadine (ALLEGRA) 180 MG tablet Take 180 mg by mouth as needed.        . fexofenadine-pseudoephedrine (ALLEGRA-D) 60-120 MG per tablet Take 1 tablet by mouth 2 (two) times daily.        [name] glucose blood (FREESTYLE LITE) test strip 1 each by Other route 2 (two) times daily. Use as instructed  100 each  12  . HYDROcodone-acetaminophen (  NORCO) 5-325 MG per tablet Take 1 tablet by mouth 4 (four) times daily as needed for pain.  120 tablet  1  . ketoconazole (NIZORAL) 2 % cream Apply topically daily.  45 g  1  . Lancets (FREESTYLE) lancets 1 each by Other route 2 (two) times daily. Use as instructed  100 each  12  . linagliptin (TRADJENTA) 5 MG TABS tablet Take 1 tablet (5 mg total) by mouth daily.  30 tablet  11  . metoCLOPramide (REGLAN) 10 MG tablet TAKE 1 TABLET BY MOUTH THREE TIMES DAILY BEFORE A MEAL  270 tablet  1  . NEXIUM 40 MG capsule TAKE ONE CAPSULE BY MOUTH EVERY MORNING  90 capsule  1  . nortriptyline (PAMELOR) 25 MG capsule TAKE 1 TO 2 CAPSULES BY MOUTH AT BEDTIME FOR INSOMNIA  60 capsule  5  . Omega-3 Fatty Acids (FISH OIL) 1000 MG CAPS Take by mouth daily.        . potassium chloride (K-DUR,KLOR-CON) 10 MEQ tablet TAKE 1 TABLET BY MOUTH EVERY DAY  90 tablet  1  . predniSONE (DELTASONE) 10 MG tablet TAKE 1 TO 2 TABLETS BY MOUTH EVERY DAY AS DIRECTED  180 tablet  3    . triamcinolone (KENALOG) 0.5 % cream Apply topically 2 (two) times daily.          Allergies  Allergen Reactions  . Enalapril Maleate     REACTION: cough  . Hydrochlorothiazide     REACTION: Low potassium  . Hydroxychloroquine Sulfate     REACTION: vision changes    Family History  Problem Relation Age of Onset  . Hypertension    . Coronary artery disease      female 1st degree relative <60  . Heart failure      congestive  . Coronary artery disease      Female 1st degree relative <50  . Breast cancer      1st degree relative <50 S  . Heart disease Mother   . Kidney disease Mother   . Cancer Father     leukemia    BP 132/88  Pulse 91  Temp(Src) 96.8 F (36 C) (Oral)  Ht 5\' 7"  (1.702 m)  Wt 295 lb (133.811 kg)  BMI 46.20 kg/m2  SpO2 95%  Review of Systems denies hypoglycemia    Objective:   Physical Exam VITAL SIGNS:  See vs page GENERAL: no distress.  Obese SKIN:  Insulin injection sites at the anterior abdomen are normal, except for a few ecchymoses.         Assessment & Plan:  DM.  needs increased rx

## 2011-05-30 NOTE — Patient Instructions (Addendum)
check your blood sugar once a day.  vary the time of day when you check, between before the 3 meals, and at bedtime.  also check if you have symptoms of your blood sugar being too high or too low.  please keep a record of the readings and bring it to your next appointment here.  please call us sooner if your blood sugar goes below 70, or if it stays over 200.  increase lantus to 50 units each morning.   Please come back for a follow-up appointment in 1 month.

## 2011-05-31 ENCOUNTER — Telehealth: Payer: Self-pay

## 2011-05-31 NOTE — Telephone Encounter (Signed)
Pt informed of referral and of MD's advisement.  

## 2011-05-31 NOTE — Telephone Encounter (Signed)
Referral sent You should see ho the swelling goes for now

## 2011-05-31 NOTE — Telephone Encounter (Signed)
Pt called stating she was advised that she would be referred to a dietician but no order was placed in [facility]. Pt also says that her ankles and feet gave been swelling since starting insulin, could this be a possible side effect?

## 2011-06-04 ENCOUNTER — Telehealth: Payer: Self-pay | Admitting: Internal Medicine

## 2011-06-04 NOTE — Telephone Encounter (Signed)
Caller: [name]/Patient; PCP: Romero Belling; CB#: [phone];  Call regarding Diabetes Out of Control; FSBG 211 before lunch. Currently on 50 units Lantus Solostar at hs.  Not on fast acting insulin. Emergent sx ruled out. See in 4 hours and parameters for callback given per Diabetes: Control Problems protocol.   Caller declined appointment due to transportation  problems.   Information noted and sent to provider for review.

## 2011-06-05 NOTE — Telephone Encounter (Signed)
Increase lantus to 65 units qd Ret as scheduled

## 2011-06-05 NOTE — Telephone Encounter (Signed)
Pt informed of MD's advisement and insulin adjustment.

## 2011-06-17 ENCOUNTER — Other Ambulatory Visit: Payer: Self-pay | Admitting: Internal Medicine

## 2011-06-18 ENCOUNTER — Telehealth: Payer: Self-pay | Admitting: Internal Medicine

## 2011-06-18 MED ORDER — FLUCONAZOLE 100 MG PO TABS: ORAL_TABLET | ORAL | Status: DC

## 2011-06-18 NOTE — Telephone Encounter (Signed)
Ok diflucan Thx 

## 2011-06-18 NOTE — Telephone Encounter (Signed)
Caller: Tashe/Patient; PCP: Sonda Primes; CB#: [phone]. Call regarding: Caller would like Rx for yeast called in. Caller reports sxs of yeast in vaginal area for the past week. Itching and burning, with sl amt of discharge. No recent antibxs, but she is diabetic. [facility] [address].  Per Vaginal Itching and Irritation Protocol, see in 24 hrs Disposition, Home care for sxs given, caller advised a note will be sent for PCP as requested.

## 2011-06-20 ENCOUNTER — Other Ambulatory Visit: Payer: Self-pay | Admitting: Internal Medicine

## 2011-06-25 ENCOUNTER — Other Ambulatory Visit: Payer: Self-pay | Admitting: Internal Medicine

## 2011-06-27 ENCOUNTER — Telehealth: Payer: Self-pay | Admitting: Internal Medicine

## 2011-06-27 NOTE — Telephone Encounter (Signed)
Caller: [name]/Patient; PCP: Sonda Primes; CB#: [phone]; Call regarding Sugar 254 and Was Told To Call When It Got Over 200 THE PATIENT REFUSED 911;  Pt calling regarding blood sugar of 254 at 1415-1.5 hrs after eating. Was instructed to call if BS reached over 200. Pt was "a little shakey, but not bad now". Had pt recheck BS since an now an hr after last check. BS now 169-pt asymptomatic. Explained that pt should wait at least 2 hrs after eating to take blood sugar. Call back parameters given. Pt to see dietician next week.

## 2011-06-27 NOTE — Telephone Encounter (Signed)
Agree. Thx 

## 2011-07-03 ENCOUNTER — Telehealth: Payer: Self-pay | Admitting: Internal Medicine

## 2011-07-03 NOTE — Telephone Encounter (Signed)
Chart reviewed  50 units lantus was not enough, and now 65 units results in low sugars overnight  OK to decrease the lantus to 58 units; cont to monitor cbg's  Pt was due to f/u with Dr Kizzie Bane but this did not occur (I suspect as MD is out this wk);  Please make appt to f/u next wk

## 2011-07-03 NOTE — Telephone Encounter (Signed)
Please advise on this SAE/AVP pt

## 2011-07-03 NOTE — Telephone Encounter (Signed)
Caller: [name]; PCP: Sonda Primes; CB#: [phone]; Call regarding Woke Up Her Blood Sugar Was 38 and Has Questions;  FBS was 38 at 0530 with sweating, "felt loopy" and weak.  Drank orange juice and soda; then ate unsweet applesause for breakfast. Blood sugar 192 at 1130.  Takes Lantus Insulin 65 units q am;  Dose taken at 0830. Afebrile.  Currently feels "fine." Advised to see MD within 4 hrs for new or increasing symptoms as defined by provider or action plan and taking meds/following therapy as prescribed per Diabetes Control Problems.  No appts remain for 07/03/11;  Info noted and sent to [facility] for call back to pt.

## 2011-07-03 NOTE — Telephone Encounter (Signed)
Patient informed of MD's instructions on insulin.

## 2011-07-09 ENCOUNTER — Encounter: Payer: Self-pay | Admitting: *Deleted

## 2011-07-09 ENCOUNTER — Encounter: Payer: Medicare Other | Attending: Endocrinology | Admitting: *Deleted

## 2011-07-09 VITALS — Ht 67.0 in | Wt 297.5 lb

## 2011-07-09 DIAGNOSIS — Z713 Dietary counseling and surveillance: Secondary | ICD-10-CM | POA: Insufficient documentation

## 2011-07-09 DIAGNOSIS — E119 Type 2 diabetes mellitus without complications: Secondary | ICD-10-CM | POA: Diagnosis not present

## 2011-07-09 DIAGNOSIS — E1165 Type 2 diabetes mellitus with hyperglycemia: Secondary | ICD-10-CM

## 2011-07-09 NOTE — Progress Notes (Signed)
  Medical Nutrition Therapy:  Appt start time: 0945 end time:  1045.   Assessment:  Primary concerns today: diabetes.   MEDICATIONS: see list   DIETARY INTAKE:  Usual eating pattern includes 3 meals and 2 snacks per day.  Everyday foods include salads, fatty meats, refined carbohydrates.  Avoided foods include none.    24-hr recall:  Wakes up between 6-10 am B ( AM): eats around 11 am bowl of cereal (honey nut cheerios) (2 cups) with whole milk or 1.5 cups unsweetened applesauce  Snk ( AM): none  L ( PM): 2-2:30 pm vegetable salad with french dressing. hot tea or diet sprite Snk ( PM): peanut butter crackers (4-6 crackers) hot tea or water D ( PM): spaghetti and salad; fried chicken tenders with salad; baked pork chop with fries or salad Snk ( PM): 10 pm more peanut butter crackers or cheese puffs Beverages: water, hot tea sweetened with NNS  Usual physical activity: none, disabled  Estimated energy needs: 1400 calories 158 g carbohydrates 105 g protein 39 g fat  Progress Towards Goal(s):  In progress.   Nutritional Diagnosis:  NB-1.1 Food and nutrition-related knowledge deficit related to carbohydrate containing foods.  As evidenced by new diagnosis of DM2 and HgA1C of 16.4%.    Intervention:  Nutrition counseling provided.  Patient is newly diagnosed with DM2.  Her blood sugars run very high, as evident by HgA1C of 16.4%.  Discussed physiology of diabetes and role of weight gain on insulin resistance.  Patient follows irregular eating pattern and experiences hypo- as well as hyper-glycemia.  Discussed importance of regular eating patterns (every 3-5 hours) especially with insulin use.  Encouraged her to monitor her blood sugars more often.  Discussed ways to treat hypoglycemia.  Discussed carb counting and portion control.  Encouraged lean proteins and limiting dietary fats.  Discussed reading food labels and increasing modified physical activity.  Handouts given during visit  include: Chair exercises Carb Counting and Food Label handouts Meal Plan Card  Goals:  Follow Diabetes Meal Plan as instructed  Eat 3 meals and 2 snacks, every 3-5 hrs  Limit carbohydrate intake to 30-45 grams carbohydrate/meal  Limit carbohydrate intake to 15 grams carbohydrate/snack  Add lean protein foods to meals/snacks  Monitor glucose levels as instructed by your doctor  Aim for 30 mins of physical activity daily  Bring food record and glucose log to your next nutrition visit  Monitoring/Evaluation:  Dietary intake, exercise, GBM, and body weight in 1 month(s).

## 2011-07-09 NOTE — Patient Instructions (Addendum)
Goals:  Follow Diabetes Meal Plan as instructed  Eat 3 meals and 2 snacks, every 3-5 hrs  Limit carbohydrate intake to 30-45 grams carbohydrate/meal  Limit carbohydrate intake to 15 grams carbohydrate/snack  Add lean protein foods to meals/snacks  Monitor glucose levels as instructed by your doctor  Aim for 30 mins of physical activity daily  Bring food record and glucose log to your next nutrition visit 

## 2011-07-11 ENCOUNTER — Encounter: Payer: Self-pay | Admitting: Endocrinology

## 2011-07-11 ENCOUNTER — Ambulatory Visit (INDEPENDENT_AMBULATORY_CARE_PROVIDER_SITE_OTHER): Payer: Medicare Other | Admitting: Endocrinology

## 2011-07-11 ENCOUNTER — Other Ambulatory Visit: Payer: Self-pay | Admitting: Internal Medicine

## 2011-07-11 VITALS — BP 128/84 | HR 87 | Temp 97.1°F | Wt 300.0 lb

## 2011-07-11 DIAGNOSIS — E1165 Type 2 diabetes mellitus with hyperglycemia: Secondary | ICD-10-CM

## 2011-07-11 MED ORDER — INSULIN LISPRO 100 UNIT/ML ~~LOC~~ SOLN: 5.0000 [IU] | Freq: Every day | SUBCUTANEOUS | Status: DC

## 2011-07-11 NOTE — Progress Notes (Signed)
Subjective:    Patient ID: [name], female    DOB: 05-16-1960, 51 y.o.   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  she brings a record of her cbg's which i have reviewed today.  Based on elevated cbg's, lantus was increased to 65 units qd.  On this, she had hypoglycemia in the early hrs of the am.  On the current 58 units qd, cbg's vary from 85-140.  It is in general higher as the day goes on.   Past Medical History  Diagnosis Date  . Abdominal pain     rlq  . Urinary tract infection, site not specified   . Sinusitis   . Osteoarthritis   . Sarcoidosis     Dr. Wilford Grist  . Esophageal reflux   . Depression   . Anxiety   . Vitamin d deficiency 2009  . Vitamin B 12 deficiency 2009  . Ruptured appendix   . Hypertension   . OSA (obstructive sleep apnea)   . Allergic rhinitis   . ABDOMINAL PAIN RIGHT LOWER QUADRANT 02/07/2007  . ALLERGIC RHINITIS 10/26/2009  . ANXIETY 08/14/2006  . B12 DEFICIENCY 08/25/2007  . DEPRESSION 08/14/2006  . DYSPNEA 04/28/2009  . ELEVATED BP 07/10/2007  . GRIEF REACTION 08/01/2009  . HYPERGLYCEMIA 12/30/2008  . HYPERTENSION 08/27/2007  . INSOMNIA, CHRONIC 03/07/2010  . NAUSEA 03/07/2010  . OBESITY 03/07/2010  . VISUAL CHANGES 07/02/2007  . VITAMIN D DEFICIENCY 08/25/2007  . Shortness of breath   . Family history of breast cancer     sister    Past Surgical History  Procedure Date  . Arthroscopic knee surgery 11-12-04    L  . Appendectomy   . Endometrial ablation   . Axillary abcess irrigation and debridement Jul & Aug2012    History   Social History  . Marital Status: Single    Spouse Name: N/A    Number of Children: N/A  . Years of Education: N/A   Occupational History  . disabled    Social History Main Topics  . Smoking status: Former Games developer  . Smokeless tobacco: Not on file  . Alcohol Use: No  . Drug Use: No  . Sexually Active: Not Currently   Other Topics Concern  . Not on  file   Social History Narrative   Single, broke up with partner in 2008.    Current Outpatient Prescriptions on File Prior to Visit  Medication Sig Dispense Refill  . aspirin EC 81 MG tablet Take 81 mg by mouth daily.        . carvedilol (COREG) 12.5 MG tablet TAKE 1 TABLET BY MOUTH TWICE DAILY  180 tablet  1  . Cholecalciferol (VITAMIN D3) 1000 UNITS tablet Take 2,000 Units by mouth daily.        . citalopram (CELEXA) 40 MG tablet TAKE 1 &1/2 TABLETS BY MOUTH DAILY  135 tablet  1  . Cyanocobalamin (VITAMIN B-12 CR) 1000 MCG TBCR Take 500 mcg by mouth daily.        . fexofenadine (ALLEGRA) 180 MG tablet Take 180 mg by mouth as needed.        . fexofenadine-pseudoephedrine (ALLEGRA-D) 60-120 MG per tablet Take 1 tablet by mouth 2 (two) times daily.        [name] glucose blood (FREESTYLE LITE) test strip 1 each by Other route 2 (two) times daily. Use as instructed  100 each  12  . ketoconazole (NIZORAL) 2 % cream  APPLY TOPICALLY DAILY  45 g  0  . Lancets (FREESTYLE) lancets 1 each by Other route 2 (two) times daily. Use as instructed  100 each  12  . linagliptin (TRADJENTA) 5 MG TABS tablet Take 1 tablet (5 mg total) by mouth daily.  30 tablet  11  . metoCLOPramide (REGLAN) 10 MG tablet TAKE 1 TABLET BY MOUTH THREE TIMES DAILY BEFORE A MEAL  270 tablet  1  . NEXIUM 40 MG capsule TAKE ONE CAPSULE BY MOUTH EVERY MORNING  90 capsule  1  . Omega-3 Fatty Acids (FISH OIL) 1000 MG CAPS Take by mouth daily.        . potassium chloride (K-DUR,KLOR-CON) 10 MEQ tablet TAKE 1 TABLET BY MOUTH EVERY DAY  90 tablet  1  . predniSONE (DELTASONE) 10 MG tablet TAKE 1 TO 2 TABLETS BY MOUTH EVERY DAY AS DIRECTED  180 tablet  3  . triamcinolone (KENALOG) 0.5 % cream Apply topically 2 (two) times daily.        . busPIRone (BUSPAR) 10 MG tablet TAKE 1 TABLET BY MOUTH TWICE DAILY  180 tablet  1  . insulin detemir (LEVEMIR FLEXPEN) 100 UNIT/ML injection Inject 5 Units into the skin daily. With evening meal  15 mL  12  .  nortriptyline (PAMELOR) 25 MG capsule TAKE 1 TO 2 CAPSULES BY MOUTH AT BEDTIME FOR INSOMNIA  180 capsule  1    Allergies  Allergen Reactions  . Enalapril Maleate     REACTION: cough  . Hydrochlorothiazide     REACTION: Low potassium  . Hydroxychloroquine Sulfate     REACTION: vision changes    Family History  Problem Relation Age of Onset  . Hypertension    . Coronary artery disease      female 1st degree relative <60  . Heart failure      congestive  . Coronary artery disease      Female 1st degree relative <50  . Breast cancer      1st degree relative <50 S  . Heart disease Mother   . Kidney disease Mother   . Cancer Father     leukemia   BP 128/84  Pulse 87  Temp 97.1 F (36.2 C) (Oral)  Wt 300 lb (136.079 kg)  SpO2 92%  Review of Systems Denies LOC.    Objective:   Physical Exam VITAL SIGNS:  See vs page GENERAL: no distress     Assessment & Plan:    DM.  therapy limited by pt's need for a simple regimen.  Based on the pattern of her cbg's, she needs a shorter-acting insulin.  However, she says she cannot use syringe and vial, and she has medicaid.

## 2011-07-11 NOTE — Patient Instructions (Addendum)
check your blood sugar once a day.  vary the time of day when you check, between before the 3 meals, and at bedtime.  also check if you have symptoms of your blood sugar being too high or too low.  please keep a record of the readings and bring it to your next appointment here.  please call us sooner if your blood sugar goes below 70, or if it stays over 200.  reduce lantus back to 50 units each morning.   Add humalog 5 units daily, with the evening meal. Please come back for a follow-up appointment in 2 months.

## 2011-07-15 ENCOUNTER — Telehealth: Payer: Self-pay | Admitting: *Deleted

## 2011-07-15 MED ORDER — INSULIN DETEMIR 100 UNIT/ML ~~LOC~~ SOLN: 5.0000 [IU] | Freq: Every day | SUBCUTANEOUS | Status: DC

## 2011-07-15 NOTE — Telephone Encounter (Signed)
i changed and sent rx 

## 2011-07-15 NOTE — Telephone Encounter (Signed)
R'cd fax from [facility] pharmacy for PA on Humalog Kwikpen-Humalog not covered on pt's formulary. Formulary alternatives are Novolog (and Flexpen), Levemir and Lantus.

## 2011-07-16 NOTE — Telephone Encounter (Signed)
Pt informed of new rx for Levemir.

## 2011-07-17 ENCOUNTER — Ambulatory Visit (INDEPENDENT_AMBULATORY_CARE_PROVIDER_SITE_OTHER): Payer: Medicare Other | Admitting: Internal Medicine

## 2011-07-17 ENCOUNTER — Telehealth: Payer: Self-pay | Admitting: *Deleted

## 2011-07-17 ENCOUNTER — Encounter: Payer: Self-pay | Admitting: Internal Medicine

## 2011-07-17 VITALS — BP 100/70 | HR 76 | Temp 97.5°F | Resp 16 | Wt 298.0 lb

## 2011-07-17 DIAGNOSIS — H5310 Unspecified subjective visual disturbances: Secondary | ICD-10-CM

## 2011-07-17 DIAGNOSIS — M79605 Pain in left leg: Secondary | ICD-10-CM

## 2011-07-17 DIAGNOSIS — E1165 Type 2 diabetes mellitus with hyperglycemia: Secondary | ICD-10-CM

## 2011-07-17 DIAGNOSIS — M79609 Pain in unspecified limb: Secondary | ICD-10-CM | POA: Diagnosis not present

## 2011-07-17 DIAGNOSIS — E538 Deficiency of other specified B group vitamins: Secondary | ICD-10-CM

## 2011-07-17 MED ORDER — FLUCONAZOLE 100 MG PO TABS: ORAL_TABLET | ORAL | Status: AC

## 2011-07-17 MED ORDER — OXYCODONE-ACETAMINOPHEN 5-325 MG PO TABS: 1.0000 | ORAL_TABLET | Freq: Three times a day (TID) | ORAL | Status: AC | PRN

## 2011-07-17 NOTE — Assessment & Plan Note (Signed)
Better Continue with current prescription therapy as reflected on the Med list.  

## 2011-07-17 NOTE — Assessment & Plan Note (Signed)
Continue with current prescription therapy as reflected on the Med list.  

## 2011-07-17 NOTE — Telephone Encounter (Signed)
Pt informed of MD's advisement. 

## 2011-07-17 NOTE — Assessment & Plan Note (Signed)
Knee xray Percocet prn

## 2011-07-17 NOTE — Progress Notes (Signed)
Subjective:    Patient ID: [name], female    DOB: [birth year], 51 y.o.   MRN: [id number]  Fall The accident occurred more than 1 week ago. The fall occurred from a stool. She fell from a height of 1 to 2 ft. She landed on hard floor. The point of impact was the buttocks and left hip. The pain is present in the left upper leg and left knee. The pain is at a severity of 8/10. The pain is severe. Pertinent negatives include no abdominal pain or headaches. She has tried rest for the symptoms. The treatment provided no relief (hydrocod did not help).    The patient presents for a follow-up of  chronic hypertension, chronic dyslipidemia, type 2 diabetes controlled with medicines. C/o yeast infection  Wt Readings from Last 3 Encounters:  07/17/11 298 lb (135.172 kg)  07/11/11 300 lb (136.079 kg)  07/09/11 297 lb 8 oz (134.945 kg)   BP Readings from Last 3 Encounters:  07/17/11 100/70  07/11/11 128/84  05/30/11 132/88      Review of Systems  Constitutional: Positive for fatigue and unexpected weight change. Negative for activity change and appetite change.  HENT: Negative for congestion, mouth sores and sinus pressure.   Eyes: Negative for visual disturbance.  Respiratory: Negative for cough and chest tightness.   Gastrointestinal: Negative for abdominal pain.  Genitourinary: Negative for difficulty urinating and vaginal pain.  Musculoskeletal: Negative for back pain and gait problem.  Skin: Negative for pallor and rash.  Neurological: Negative for dizziness, tremors, weakness and headaches.  Psychiatric/Behavioral: Positive for disturbed wake/sleep cycle. Negative for suicidal ideas, hallucinations, confusion and agitation. The patient is nervous/anxious.         Objective:   Physical Exam  Constitutional: She appears well-developed. No distress.       Obese   HENT:  Head: Normocephalic.  Right Ear: External ear normal.  Left Ear: External ear normal.  Nose:  Nose normal.  Mouth/Throat: Oropharynx is clear and moist. No oropharyngeal exudate.       Caries; missing front teeth  Eyes: Conjunctivae are normal. Pupils are equal, round, and reactive to light. Right eye exhibits no discharge. Left eye exhibits no discharge.  Neck: Normal range of motion. Neck supple. No JVD present. No tracheal deviation present. No thyromegaly present.  Cardiovascular: Normal rate, regular rhythm and normal heart sounds.   Pulmonary/Chest: No stridor. No respiratory distress. She has no wheezes.  Abdominal: Soft. Bowel sounds are normal. She exhibits no distension and no mass. There is no tenderness. There is no rebound and no guarding.  Musculoskeletal: She exhibits no edema and no tenderness.  Lymphadenopathy:    She has no cervical adenopathy.  Neurological: She displays normal reflexes. No cranial nerve deficit. She exhibits normal muscle tone. Coordination normal.  Skin: No rash noted. No erythema.  Psychiatric: She has a normal mood and affect. Her behavior is normal. Judgment and thought content normal.   Cane     Lab Results  Component Value Date   WBC 7.2 05/09/2011   HGB 15.6* 05/09/2011   HCT 45.8 05/09/2011   PLT 211.0 05/09/2011   CHOL 193 06/29/2008   TRIG 145.0 06/29/2008   HDL 67.50 06/29/2008   ALT 77* 05/09/2011   AST 62* 05/09/2011   NA 133* 05/09/2011   K 4.1 05/09/2011   CL 90* 05/09/2011   CREATININE 1.0 05/09/2011   BUN 9 05/09/2011   CO2 27 05/09/2011   TSH 4.30 05/09/2011  INR 1.01 10/01/2010   HGBA1C 16.4* 05/09/2011   MICROALBUR 0.4 06/29/2008     Assessment & Plan:

## 2011-07-17 NOTE — Assessment & Plan Note (Signed)
Better  

## 2011-07-17 NOTE — Telephone Encounter (Signed)
Ok.  Please come back here approx 2 weeks after starting new insulin.

## 2011-07-17 NOTE — Telephone Encounter (Signed)
Pt still has samples of Humalog Kwikpen left and wants to know if she can finish samples of Humalog before starting Levemir.

## 2011-07-19 ENCOUNTER — Telehealth: Payer: Self-pay | Admitting: Internal Medicine

## 2011-07-19 ENCOUNTER — Ambulatory Visit (INDEPENDENT_AMBULATORY_CARE_PROVIDER_SITE_OTHER)
Admission: RE | Admit: 2011-07-19 | Discharge: 2011-07-19 | Disposition: A | Discharge status: Home / Self Care | Payer: Medicare Other | Source: Ambulatory Visit | Attending: Internal Medicine | Admitting: Internal Medicine

## 2011-07-19 DIAGNOSIS — M79605 Pain in left leg: Secondary | ICD-10-CM

## 2011-07-19 DIAGNOSIS — M79609 Pain in unspecified limb: Secondary | ICD-10-CM | POA: Diagnosis not present

## 2011-07-19 DIAGNOSIS — M25869 Other specified joint disorders, unspecified knee: Secondary | ICD-10-CM | POA: Diagnosis not present

## 2011-07-19 DIAGNOSIS — M25569 Pain in unspecified knee: Secondary | ICD-10-CM | POA: Diagnosis not present

## 2011-07-19 NOTE — Telephone Encounter (Signed)
[name], please, inform patient that she has knee OA; nofx Thx

## 2011-07-22 NOTE — Telephone Encounter (Signed)
Left detailed mess informing pt of below.  

## 2011-07-30 ENCOUNTER — Encounter: Payer: Self-pay | Admitting: Internal Medicine

## 2011-07-30 ENCOUNTER — Telehealth: Payer: Self-pay | Admitting: Internal Medicine

## 2011-07-30 ENCOUNTER — Ambulatory Visit (INDEPENDENT_AMBULATORY_CARE_PROVIDER_SITE_OTHER): Payer: Medicare Other | Admitting: Internal Medicine

## 2011-07-30 VITALS — BP 100/76 | HR 95 | Temp 97.7°F | Resp 14 | Wt 307.0 lb

## 2011-07-30 DIAGNOSIS — M79606 Pain in leg, unspecified: Secondary | ICD-10-CM

## 2011-07-30 DIAGNOSIS — M79609 Pain in unspecified limb: Secondary | ICD-10-CM

## 2011-07-30 NOTE — Telephone Encounter (Signed)
Caller: Hilliary/Patient; PCP: Sonda Primes; CB#: [phone]; Call regarding L leg pain.  Has been taking Percocet but pain is severe enough it is difficult to walk.   Seen in office 07/17/11 and xrays were done 07/19/11.  States difficulty walking is interfering with self care, getting to the bathroom, etc.   States pain is worse than when seen 07/17/11.  Per protocol, advised appt wihtin 4 hours; appt sched 07/30/11 1615 with Dr. Debby Bud.

## 2011-07-31 ENCOUNTER — Telehealth: Payer: Self-pay | Admitting: Internal Medicine

## 2011-07-31 NOTE — Progress Notes (Signed)
  Subjective:    Patient ID: [name], female    DOB: 1960/06/09, 51 y.o.   MRN: [id number]  HPI Patient left without being seen.  Review of Systems     Objective:   Physical Exam        Assessment & Plan:

## 2011-07-31 NOTE — Telephone Encounter (Signed)
Caller: [name]/Patient; Phone Number: [phone]; Message from caller: Pt.  Was there for an appt.  On 7/16.  Pt.  Had appt.  For 4pm.  Pt.  Was still in a room to be seen at 5: 20pm and had to leave because her ride was there to take her home. Pt. Was to be seen because she is still in a lot of pain, from Osteoarthritis in L leg.  Pt. Is taking 5/325 of Percocet for pain, but still has significant pain.  Please contact pt. At number above.  Db/CAN

## 2011-07-31 NOTE — Telephone Encounter (Signed)
I'm sorry! What should I do? I can see her tomorrow Thx

## 2011-08-01 NOTE — Telephone Encounter (Signed)
She is coming Friday morning.

## 2011-08-02 ENCOUNTER — Encounter: Payer: Self-pay | Admitting: Internal Medicine

## 2011-08-02 ENCOUNTER — Ambulatory Visit (INDEPENDENT_AMBULATORY_CARE_PROVIDER_SITE_OTHER): Payer: Medicare Other | Admitting: Internal Medicine

## 2011-08-02 VITALS — BP 128/90 | HR 80 | Temp 97.3°F | Resp 16 | Wt 301.0 lb

## 2011-08-02 DIAGNOSIS — M25562 Pain in left knee: Secondary | ICD-10-CM

## 2011-08-02 DIAGNOSIS — M25569 Pain in unspecified knee: Secondary | ICD-10-CM

## 2011-08-02 DIAGNOSIS — M79605 Pain in left leg: Secondary | ICD-10-CM

## 2011-08-02 DIAGNOSIS — M79609 Pain in unspecified limb: Secondary | ICD-10-CM | POA: Diagnosis not present

## 2011-08-02 MED ORDER — FUROSEMIDE 40 MG PO TABS: 40.0000 mg | ORAL_TABLET | Freq: Every day | ORAL | Status: DC | PRN

## 2011-08-02 MED ORDER — OXYCODONE-ACETAMINOPHEN 10-325 MG PO TABS: 1.0000 | ORAL_TABLET | Freq: Three times a day (TID) | ORAL | Status: DC | PRN

## 2011-08-02 NOTE — Progress Notes (Signed)
Patient ID: [name], female   DOB: 1960-09-30, 51 y.o.   MRN: [id number]   Subjective:    Patient ID: [name], female    DOB: 1960/02/16, 51 y.o.   MRN: [id number]  HPI   C/o L knee pain 3 wks following the fall    Wt Readings from Last 3 Encounters:  08/02/11 301 lb (136.533 kg)  07/30/11 307 lb (139.254 kg)  07/17/11 298 lb (135.172 kg)   BP Readings from Last 3 Encounters:  08/02/11 128/90  07/30/11 100/76  07/17/11 100/70      Review of Systems  Constitutional: Positive for fatigue and unexpected weight change. Negative for activity change and appetite change.  HENT: Negative for congestion, mouth sores and sinus pressure.   Eyes: Negative for visual disturbance.  Respiratory: Negative for cough and chest tightness.   Genitourinary: Negative for difficulty urinating and vaginal pain.  Musculoskeletal: Negative for back pain and gait problem.  Skin: Negative for pallor and rash.  Neurological: Negative for dizziness, tremors and weakness.  Psychiatric/Behavioral: Positive for disturbed wake/sleep cycle. Negative for suicidal ideas, hallucinations, confusion and agitation. The patient is nervous/anxious.         Objective:   Physical Exam  Constitutional: She appears well-developed. No distress.       Obese   HENT:  Head: Normocephalic.  Right Ear: External ear normal.  Left Ear: External ear normal.  Nose: Nose normal.  Mouth/Throat: Oropharynx is clear and moist. No oropharyngeal exudate.       Caries; missing front teeth  Eyes: Conjunctivae are normal. Pupils are equal, round, and reactive to light. Right eye exhibits no discharge. Left eye exhibits no discharge.  Neck: Normal range of motion. Neck supple. No JVD present. No tracheal deviation present. No thyromegaly present.  Cardiovascular: Normal rate, regular rhythm and normal heart sounds.   Pulmonary/Chest: No stridor. No respiratory distress. She has no wheezes.  Abdominal:  Soft. Bowel sounds are normal. She exhibits no distension and no mass. There is no tenderness. There is no rebound and no guarding.  Musculoskeletal: She exhibits no edema and no tenderness.  Lymphadenopathy:    She has no cervical adenopathy.  Neurological: She displays normal reflexes. No cranial nerve deficit. She exhibits normal muscle tone. Coordination normal.  Skin: No rash noted. No erythema.  Psychiatric: She has a normal mood and affect. Her behavior is normal. Judgment and thought content normal.    Procedure Note :     Procedure :Joint Injection, L  knee   Indication:  Joint osteoarthritis with refractory  chronic pain.   Risks including unsuccessful procedure , bleeding, infection, bruising, skin atrophy and others were explained to the patient in detail as well as the benefits. Informed consent was obtained and signed.   Tthe patient was placed in a comfortable position. Lateral approach was used. Skin was prepped with Betadine and alcohol  and anesthetized acooling spray. Then, a 5 cc syringe with a 1.5 inch long 25-gauge needle was used for a joint injection.. The needle was advanced  Into the knee joint cavity. I aspirated a small amount of intra-articular fluid to confirm correct placement of the needle and injected the joint with 4 mL of 2% lidocaine and 40 mg of Depo-Medrol .  Band-Aid was applied.   Tolerated well. Complications: None. Good pain relief following the procedure.   Postprocedure instructions :    A Band-Aid should be left on for 12 hours. Injection therapy is not a cure  itself. It is used in conjunction with other modalities. You can use nonsteroidal anti-inflammatories like ibuprofen , hot and cold compresses. Rest is recommended in the next 24 hours. You need to report immediately  if fever, chills or any signs of infection develop.      Lab Results  Component Value Date   WBC 7.2 05/09/2011   HGB 15.6* 05/09/2011   HCT 45.8 05/09/2011   PLT 211.0  05/09/2011   CHOL 193 06/29/2008   TRIG 145.0 06/29/2008   HDL 67.50 06/29/2008   ALT 77* 05/09/2011   AST 62* 05/09/2011   NA 133* 05/09/2011   K 4.1 05/09/2011   CL 90* 05/09/2011   CREATININE 1.0 05/09/2011   BUN 9 05/09/2011   CO2 27 05/09/2011   TSH 4.30 05/09/2011   INR 1.01 10/01/2010   HGBA1C 16.4* 05/09/2011   MICROALBUR 0.4 06/29/2008     Assessment & Plan:

## 2011-08-03 ENCOUNTER — Encounter: Payer: Self-pay | Admitting: Internal Medicine

## 2011-08-03 DIAGNOSIS — M25562 Pain in left knee: Secondary | ICD-10-CM | POA: Insufficient documentation

## 2011-08-03 MED ORDER — METHYLPREDNISOLONE ACETATE 80 MG/ML IJ SUSP: 40.0000 mg | Freq: Once | INTRAMUSCULAR | Status: DC

## 2011-08-03 NOTE — Assessment & Plan Note (Signed)
Stretching exercises at home

## 2011-08-03 NOTE — Assessment & Plan Note (Signed)
Options discussed. The pt has elected to have a knee injection See meds

## 2011-08-06 ENCOUNTER — Encounter: Payer: Medicare Other | Attending: Endocrinology | Admitting: *Deleted

## 2011-08-06 ENCOUNTER — Encounter: Payer: Self-pay | Admitting: *Deleted

## 2011-08-06 VITALS — Wt 302.0 lb

## 2011-08-06 DIAGNOSIS — E1165 Type 2 diabetes mellitus with hyperglycemia: Secondary | ICD-10-CM

## 2011-08-06 DIAGNOSIS — E119 Type 2 diabetes mellitus without complications: Secondary | ICD-10-CM | POA: Insufficient documentation

## 2011-08-06 DIAGNOSIS — Z713 Dietary counseling and surveillance: Secondary | ICD-10-CM | POA: Diagnosis not present

## 2011-08-06 NOTE — Patient Instructions (Addendum)
Breakfast by 10 am -- 3 carb choices plus protein Lunch 12-1pm--3 carb choices plus protein and vegetables Snack around 3 pm--1 carb choice plus protein Dinner 6-7 pm--3 carb choices plus protein and vegetables If needed, snack around 9pm--1 carb choice plus protein  Check blood sugar in morning and either: 2 hr after first bite breakfast, or after lunch, or after dinner, or before bed.  Write down and bring to next appointment  Try chair exercises 3-4 days/week for 10-20 min

## 2011-08-06 NOTE — Progress Notes (Signed)
  Medical Nutrition Therapy:  Appt start time: 1030 end time:  1100.   Assessment:  Primary concerns today: diabetes follow up.   MEDICATIONS: see list   DIETARY INTAKE:  Usual eating pattern includes 3 meals and 1-2 snacks per day.  Everyday foods include starches, meats, some vegetables.  Avoided foods include none.    24-hr recall:  B ( AM): cereal with 2%  Milk or mini pancakes with hot tea with NNS  Snk ( AM): peanuts or peanut butter sandwich L ( PM): salad with crackers sometimes Snk ( PM): none D ( PM): spaghetti; chicken tenders; or french fries Snk ( PM): peanut butter sandwich or crackers Beverages: hot tea, water  Usual physical activity: 2-3 days some chair exercises  Estimated energy needs:  1400 calories  158 g carbohydrates  105 g protein  39 g fat   Progress Towards Goal(s): Some progress.   Nutritional Diagnosis:  NB-1.1 Food and nutrition-related knowledge deficit related to carbohydrate containing foods. As evidenced by new diagnosis of DM2 and HgA1C of 16.4%.    Intervention:  Nutrition counseling provided.  [name] states she has been watching her portions, trying to eat a breakfast, and doing chair exercises 2 days a week.  She monitors her blood sugars 1 time a day and has been prescribed an increase in insulin.  She has also gained 5 pounds.  Encouraged her to eat 3 balanced meals at scheduled times, plus 1-2 snacks. Reemphasized importance of carb counting.  Encouraged 3 carb choice/meal plus protein and 1 carb choice per snack plus protein.  Encouraged free vegetables and increasing chair exercises, as able.    Handouts given during visit include:  My Meal plan card  Monitoring/Evaluation:  Dietary intake, exercise, GBM, and body weight in 1 month(s).

## 2011-08-27 ENCOUNTER — Other Ambulatory Visit: Payer: Self-pay | Admitting: Internal Medicine

## 2011-09-03 ENCOUNTER — Ambulatory Visit (INDEPENDENT_AMBULATORY_CARE_PROVIDER_SITE_OTHER): Payer: Medicare Other | Admitting: Internal Medicine

## 2011-09-03 ENCOUNTER — Encounter: Payer: Self-pay | Admitting: Internal Medicine

## 2011-09-03 VITALS — BP 110/80 | HR 80 | Temp 97.4°F | Resp 16 | Wt 291.5 lb

## 2011-09-03 DIAGNOSIS — M25569 Pain in unspecified knee: Secondary | ICD-10-CM | POA: Diagnosis not present

## 2011-09-03 DIAGNOSIS — E669 Obesity, unspecified: Secondary | ICD-10-CM

## 2011-09-03 DIAGNOSIS — E538 Deficiency of other specified B group vitamins: Secondary | ICD-10-CM

## 2011-09-03 DIAGNOSIS — E1165 Type 2 diabetes mellitus with hyperglycemia: Secondary | ICD-10-CM

## 2011-09-03 MED ORDER — OXYCODONE-ACETAMINOPHEN 10-325 MG PO TABS: 1.0000 | ORAL_TABLET | Freq: Four times a day (QID) | ORAL | Status: DC | PRN

## 2011-09-03 NOTE — Assessment & Plan Note (Signed)
She asked me to inject. Will inject

## 2011-09-03 NOTE — Assessment & Plan Note (Signed)
Better Continue with current prescription therapy as reflected on the Med list.  

## 2011-09-03 NOTE — Progress Notes (Signed)
Subjective:    Patient ID: [name], female    DOB: 01/31/1960, 51 y.o.   MRN: [id number]  HPI   C/o L knee pain  following the fall  - better post-injection C/o R knee pain The patient presents for a follow-up of  chronic hypertension, chronic dyslipidemia, type 2 diabetes controlled with medicines  She lost wt on diet    Wt Readings from Last 3 Encounters:  09/03/11 291 lb 8 oz (132.224 kg)  08/06/11 302 lb (136.986 kg)  08/02/11 301 lb (136.533 kg)   BP Readings from Last 3 Encounters:  09/03/11 110/80  08/02/11 128/90  07/30/11 100/76      Review of Systems  Constitutional: Positive for fatigue and unexpected weight change. Negative for activity change and appetite change.  HENT: Negative for congestion, mouth sores and sinus pressure.   Eyes: Negative for visual disturbance.  Respiratory: Negative for cough and chest tightness.   Genitourinary: Negative for difficulty urinating and vaginal pain.  Musculoskeletal: Negative for back pain and gait problem.  Skin: Negative for pallor and rash.  Neurological: Negative for dizziness, tremors and weakness.  Psychiatric/Behavioral: Positive for disturbed wake/sleep cycle. Negative for suicidal ideas, hallucinations, confusion and agitation. The patient is nervous/anxious.         Objective:   Physical Exam  Constitutional: She appears well-developed. No distress.       Obese   HENT:  Head: Normocephalic.  Right Ear: External ear normal.  Left Ear: External ear normal.  Nose: Nose normal.  Mouth/Throat: Oropharynx is clear and moist. No oropharyngeal exudate.       Caries; missing front teeth  Eyes: Conjunctivae are normal. Pupils are equal, round, and reactive to light. Right eye exhibits no discharge. Left eye exhibits no discharge.  Neck: Normal range of motion. Neck supple. No JVD present. No tracheal deviation present. No thyromegaly present.  Cardiovascular: Normal rate, regular rhythm and  normal heart sounds.   Pulmonary/Chest: No stridor. No respiratory distress. She has no wheezes.  Abdominal: Soft. Bowel sounds are normal. She exhibits no distension and no mass. There is no tenderness. There is no rebound and no guarding.  Musculoskeletal: She exhibits no edema and no tenderness.  Lymphadenopathy:    She has no cervical adenopathy.  Neurological: She displays normal reflexes. No cranial nerve deficit. She exhibits normal muscle tone. Coordination normal.  Skin: No rash noted. No erythema.  Psychiatric: She has a normal mood and affect. Her behavior is normal. Judgment and thought content normal.    R knee is less tender     Lab Results  Component Value Date   WBC 7.2 05/09/2011   HGB 15.6* 05/09/2011   HCT 45.8 05/09/2011   PLT 211.0 05/09/2011   CHOL 193 06/29/2008   TRIG 145.0 06/29/2008   HDL 67.50 06/29/2008   ALT 77* 05/09/2011   AST 62* 05/09/2011   NA 133* 05/09/2011   K 4.1 05/09/2011   CL 90* 05/09/2011   CREATININE 1.0 05/09/2011   BUN 9 05/09/2011   CO2 27 05/09/2011   TSH 4.30 05/09/2011   INR 1.01 10/01/2010   HGBA1C 16.4* 05/09/2011   MICROALBUR 0.4 06/29/2008   Procedure :Joint Injection, R knee  Indication: Joint osteoarthritis with refractory chronic pain.  Risks including unsuccessful procedure , bleeding, infection, bruising, skin atrophy and others were explained to the patient in detail as well as the benefits. Informed consent was obtained and signed.  Tthe patient was placed in a comfortable  position. Lateral approach was used. Skin was prepped with Betadine and alcohol and anesthetized acooling spray. Then, a 5 cc syringe with a 1.5 inch long 25-gauge needle was used for a joint injection.. The needle was advanced Into the knee joint cavity. I aspirated a small amount of intra-articular fluid to confirm correct placement of the needle and injected the joint with 4 mL of 2% lidocaine and 40 mg of Depo-Medrol . Band-Aid was applied.  Tolerated well.  Complications: None. Good pain relief following the procedure.  Postprocedure instructions :  A Band-Aid should be left on for 12 hours. Injection therapy is not a cure itself. It is used in conjunction with other modalities. You can use nonsteroidal anti-inflammatories like ibuprofen , hot and cold compresses. Rest is recommended in the next 24 hours. You need to report immediately if fever, chills or any signs of infection develop.   Assessment & Plan:

## 2011-09-04 ENCOUNTER — Ambulatory Visit: Payer: Medicare Other | Admitting: *Deleted

## 2011-09-10 ENCOUNTER — Telehealth: Payer: Self-pay | Admitting: Internal Medicine

## 2011-09-10 NOTE — Telephone Encounter (Signed)
Caller: [name]/Patient; Patient Name: [name]; PCP: Sonda Primes; Best Callback Phone Number: [phone] Pt is calling to ask MD if he would call in an antibiotic and cough medicine for her. Pt was seen last week. Rn triaged per cough - disposition is see Provider in 24h. RN advised an appt. Pt refused. States she was just seen last week and the MD usually just calls in an abx for her. Pt is afeb. Cough is productive/dark green. Request for antibiotics and  cough medicine forwarded to the office due to pts request and refusal to schedule. OFFICE PLEASE CALL PT BACK.

## 2011-09-11 MED ORDER — AZITHROMYCIN 250 MG PO TABS: ORAL_TABLET | ORAL | Status: DC

## 2011-09-11 NOTE — Telephone Encounter (Signed)
Ok Zpac OV if sick Thx

## 2011-09-11 NOTE — Telephone Encounter (Signed)
Notified pt z-pack sent to [facility]...[name]

## 2011-09-12 ENCOUNTER — Ambulatory Visit (INDEPENDENT_AMBULATORY_CARE_PROVIDER_SITE_OTHER): Payer: Medicare Other | Admitting: Endocrinology

## 2011-09-12 ENCOUNTER — Encounter: Payer: Self-pay | Admitting: Endocrinology

## 2011-09-12 VITALS — BP 130/74 | HR 81 | Temp 97.6°F | Ht 67.0 in | Wt 290.0 lb

## 2011-09-12 DIAGNOSIS — E1165 Type 2 diabetes mellitus with hyperglycemia: Secondary | ICD-10-CM

## 2011-09-12 DIAGNOSIS — IMO0001 Reserved for inherently not codable concepts without codable children: Secondary | ICD-10-CM

## 2011-09-12 NOTE — Progress Notes (Signed)
Subjective:    Patient ID: [name], female    DOB: [birth year], 51 y.o.   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  She is feel better since having a recent URI.  no cbg record, but states cbg's var from 100-200.  It is in general higher as the day goes on.   Past Medical History  Diagnosis Date  . Abdominal pain     rlq  . Urinary tract infection, site not specified   . Sinusitis   . Osteoarthritis   . Sarcoidosis     Dr. Wilford Grist  . Esophageal reflux   . Depression   . Anxiety   . Vitamin d deficiency 2009  . Vitamin B 12 deficiency 2009  . Ruptured appendix   . Hypertension   . OSA (obstructive sleep apnea)   . Allergic rhinitis   . ABDOMINAL PAIN RIGHT LOWER QUADRANT 02/07/2007  . ALLERGIC RHINITIS 10/26/2009  . ANXIETY 08/14/2006  . B12 DEFICIENCY 08/25/2007  . DEPRESSION 08/14/2006  . DYSPNEA 04/28/2009  . ELEVATED BP 07/10/2007  . GRIEF REACTION 08/01/2009  . HYPERGLYCEMIA 12/30/2008  . HYPERTENSION 08/27/2007  . INSOMNIA, CHRONIC 03/07/2010  . NAUSEA 03/07/2010  . OBESITY 03/07/2010  . VISUAL CHANGES 07/02/2007  . VITAMIN D DEFICIENCY 08/25/2007  . Shortness of breath   . Family history of breast cancer     sister    Past Surgical History  Procedure Date  . Arthroscopic knee surgery 11-12-04    L  . Appendectomy   . Endometrial ablation   . Axillary abcess irrigation and debridement Jul & Aug2012    History   Social History  . Marital Status: Single    Spouse Name: N/A    Number of Children: N/A  . Years of Education: N/A   Occupational History  . disabled    Social History Main Topics  . Smoking status: Former Games developer  . Smokeless tobacco: Not on file  . Alcohol Use: No  . Drug Use: No  . Sexually Active: Not Currently   Other Topics Concern  . Not on file   Social History Narrative   Single, broke up with partner in 2008.    Current Outpatient Prescriptions on File Prior to Visit    Medication Sig Dispense Refill  . aspirin EC 81 MG tablet Take 81 mg by mouth daily.        . busPIRone (BUSPAR) 10 MG tablet TAKE 1 TABLET BY MOUTH TWICE DAILY  180 tablet  1  . carvedilol (COREG) 12.5 MG tablet TAKE 1 TABLET BY MOUTH TWICE DAILY  180 tablet  1  . Cholecalciferol (VITAMIN D3) 1000 UNITS tablet Take 2,000 Units by mouth daily.        . citalopram (CELEXA) 40 MG tablet TAKE 1 &1/2 TABLETS BY MOUTH DAILY  135 tablet  1  . Cyanocobalamin (VITAMIN B-12 CR) 1000 MCG TBCR Take 500 mcg by mouth daily.        . fexofenadine (ALLEGRA) 180 MG tablet Take 180 mg by mouth as needed.        . fexofenadine-pseudoephedrine (ALLEGRA-D) 60-120 MG per tablet Take 1 tablet by mouth 2 (two) times daily.        . furosemide (LASIX) 40 MG tablet Take 1 tablet (40 mg total) by mouth daily as needed (swelling).  30 tablet  3  . glucose blood (FREESTYLE LITE) test strip 1 each by Other route 2 (two) times daily.  Use as instructed  100 each  12  . insulin glargine (LANTUS) 100 UNIT/ML injection Inject 50 Units into the skin every morning.       [name] ketoconazole (NIZORAL) 2 % cream APPLY TOPICALLY DAILY  45 g  1  . Lancets (FREESTYLE) lancets 1 each by Other route 2 (two) times daily. Use as instructed  100 each  12  . metoCLOPramide (REGLAN) 10 MG tablet TAKE 1 TABLET BY MOUTH THREE TIMES DAILY BEFORE A MEAL  270 tablet  1  . Omega-3 Fatty Acids (FISH OIL) 1000 MG CAPS Take by mouth daily.        [name] oxyCODONE-acetaminophen (PERCOCET) 10-325 MG per tablet Take 1 tablet by mouth every 6 (six) hours as needed for pain.  120 tablet  0  . potassium chloride (K-DUR,KLOR-CON) 10 MEQ tablet TAKE 1 TABLET BY MOUTH EVERY DAY  90 tablet  1  . predniSONE (DELTASONE) 10 MG tablet TAKE 1 TO 2 TABLETS BY MOUTH EVERY DAY AS DIRECTED  180 tablet  3  . triamcinolone (KENALOG) 0.5 % cream Apply topically 2 (two) times daily.        [name] azithromycin (ZITHROMAX) 250 MG tablet Take 2 tabs today then 1 tab for the next 4 days  6  each  0  . NEXIUM 40 MG capsule TAKE ONE CAPSULE BY MOUTH EVERY MORNING  90 capsule  1  . nortriptyline (PAMELOR) 25 MG capsule TAKE 1 TO 2 CAPSULES BY MOUTH AT BEDTIME FOR INSOMNIA  180 capsule  1   Current Facility-Administered Medications on File Prior to Visit  Medication Dose Route Frequency Provider Last Rate Last Dose  . methylPREDNISolone acetate (DEPO-MEDROL) injection 40 mg  40 mg Intra-articular Once Tresa Garter, MD        Allergies  Allergen Reactions  . Enalapril Maleate     REACTION: cough  . Hydrochlorothiazide     REACTION: Low potassium  . Hydroxychloroquine Sulfate     REACTION: vision changes    Family History  Problem Relation Age of Onset  . Hypertension    . Coronary artery disease      female 1st degree relative <60  . Heart failure      congestive  . Coronary artery disease      Female 1st degree relative <50  . Breast cancer      1st degree relative <50 S  . Heart disease Mother   . Kidney disease Mother   . Cancer Father     leukemia    BP 130/74  Pulse 81  Temp 97.6 F (36.4 C) (Oral)  Ht 5\' 7"  (1.702 m)  Wt 290 lb (131.543 kg)  BMI 45.42 kg/m2  SpO2 95%  Review of Systems She has lost weight due to her efforts.    Objective:   Physical Exam VITAL SIGNS:  See vs page GENERAL: no distress Pulses: dorsalis pedis intact bilat.   Feet: no deformity.  no ulcer on the feet.  feet are of normal color and temp.  no edema Neuro: sensation is intact to touch on the feet     Assessment & Plan:  DM: Based on the pattern of her cbg's, she needs some adjustment in her therapy

## 2011-09-12 NOTE — Patient Instructions (Addendum)
check your blood sugar once a day.  vary the time of day when you check, between before the 3 meals, and at bedtime.  also check if you have symptoms of your blood sugar being too high or too low.  please keep a record of the readings and bring it to your next appointment here.  please call us sooner if your blood sugar goes below 70, or if it stays over 200.  reduce lantus back to 50 units each morning.   take novolog 10 units daily, with the evening meal. Please come back for a follow-up appointment in 6 weeks.   Stop tradjenta.

## 2011-09-27 ENCOUNTER — Encounter: Payer: Self-pay | Admitting: Internal Medicine

## 2011-09-27 MED ORDER — METHYLPREDNISOLONE ACETATE 80 MG/ML IJ SUSP: 40.0000 mg | Freq: Once | INTRAMUSCULAR | Status: DC

## 2011-09-27 NOTE — Assessment & Plan Note (Signed)
Continue with current prescription therapy as reflected on the Med list.  

## 2011-09-27 NOTE — Assessment & Plan Note (Signed)
She lost wt on diet

## 2011-09-27 NOTE — Addendum Note (Signed)
Addended by: Tresa Garter on: 09/27/2011 11:42 PM   Modules accepted: Orders

## 2011-10-04 ENCOUNTER — Other Ambulatory Visit: Payer: Self-pay

## 2011-10-04 ENCOUNTER — Telehealth: Payer: Self-pay | Admitting: Internal Medicine

## 2011-10-04 MED ORDER — OXYCODONE-ACETAMINOPHEN 10-325 MG PO TABS: 1.0000 | ORAL_TABLET | Freq: Four times a day (QID) | ORAL | Status: DC | PRN

## 2011-10-04 NOTE — Telephone Encounter (Signed)
Patient would like a refill on her Oxycodone

## 2011-10-04 NOTE — Telephone Encounter (Signed)
OK to fill this prescription with additional refills x0 Thank you!  

## 2011-10-04 NOTE — Telephone Encounter (Signed)
Rx printed. Pt informed.

## 2011-10-21 ENCOUNTER — Telehealth: Payer: Self-pay | Admitting: Internal Medicine

## 2011-10-21 NOTE — Telephone Encounter (Signed)
Caller: Evelean/Patient; Patient Name: [name]; PCP: Plotnikov, Alex (Adults only); Best Callback Phone Number: [phone]; Reason for call: was prescribed ibuprofen after dental surgery.  States she takes percocet, and wants to make sure it's ok to take motrin as prescribed by the dentist.  No recent medication changes; ibuprofen not noted on "do not take"/allergy listing per Epic.  Advised patient not to take extra tylenol while on percocet; patient states that is probably what Dr. Posey Rea meant when he told her not to take pain medications while taking percocet.  Will take ibuprofen as advised by oral surgeon.

## 2011-10-21 NOTE — Telephone Encounter (Signed)
OK to take Ibuprofen Thx

## 2011-10-22 NOTE — Telephone Encounter (Signed)
Pt informed

## 2011-10-24 ENCOUNTER — Ambulatory Visit: Payer: Medicare Other | Admitting: Endocrinology

## 2011-10-28 ENCOUNTER — Other Ambulatory Visit: Payer: Self-pay | Admitting: Internal Medicine

## 2011-10-29 ENCOUNTER — Other Ambulatory Visit: Payer: Self-pay

## 2011-10-29 ENCOUNTER — Other Ambulatory Visit: Payer: Self-pay | Admitting: Internal Medicine

## 2011-10-30 ENCOUNTER — Telehealth: Payer: Self-pay

## 2011-10-30 NOTE — Telephone Encounter (Signed)
A user error has taken place: encounter opened in error, closed for administrative reasons.

## 2011-10-31 ENCOUNTER — Telehealth: Payer: Self-pay | Admitting: Internal Medicine

## 2011-10-31 ENCOUNTER — Other Ambulatory Visit: Payer: Self-pay | Admitting: *Deleted

## 2011-10-31 MED ORDER — CARVEDILOL 12.5 MG PO TABS: 12.5000 mg | ORAL_TABLET | Freq: Two times a day (BID) | ORAL | Status: DC

## 2011-11-04 ENCOUNTER — Telehealth: Payer: Self-pay | Admitting: Internal Medicine

## 2011-11-04 NOTE — Telephone Encounter (Signed)
Patient is requesting a refill on percocet, call when ready

## 2011-11-05 MED ORDER — OXYCODONE-ACETAMINOPHEN 10-325 MG PO TABS: 1.0000 | ORAL_TABLET | Freq: Four times a day (QID) | ORAL | Status: DC | PRN

## 2011-11-05 NOTE — Telephone Encounter (Signed)
Rx printed. Pt informed.

## 2011-11-05 NOTE — Telephone Encounter (Signed)
Please advise on below  

## 2011-11-05 NOTE — Telephone Encounter (Signed)
OK to fill this prescription with additional refills 0 Thank you!  

## 2011-11-07 ENCOUNTER — Other Ambulatory Visit: Payer: Self-pay | Admitting: *Deleted

## 2011-11-07 MED ORDER — FUROSEMIDE 40 MG PO TABS: 40.0000 mg | ORAL_TABLET | Freq: Every day | ORAL | Status: DC | PRN

## 2011-11-09 DIAGNOSIS — Z23 Encounter for immunization: Secondary | ICD-10-CM | POA: Diagnosis not present

## 2011-11-26 ENCOUNTER — Other Ambulatory Visit (INDEPENDENT_AMBULATORY_CARE_PROVIDER_SITE_OTHER): Payer: Medicare Other

## 2011-11-26 ENCOUNTER — Ambulatory Visit (INDEPENDENT_AMBULATORY_CARE_PROVIDER_SITE_OTHER): Payer: Medicare Other | Admitting: Internal Medicine

## 2011-11-26 ENCOUNTER — Ambulatory Visit: Payer: Medicare Other | Admitting: Internal Medicine

## 2011-11-26 ENCOUNTER — Encounter: Payer: Self-pay | Admitting: Internal Medicine

## 2011-11-26 VITALS — BP 110/68 | HR 64 | Temp 96.8°F | Resp 16 | Wt 303.0 lb

## 2011-11-26 DIAGNOSIS — M25569 Pain in unspecified knee: Secondary | ICD-10-CM | POA: Diagnosis not present

## 2011-11-26 DIAGNOSIS — E559 Vitamin D deficiency, unspecified: Secondary | ICD-10-CM

## 2011-11-26 DIAGNOSIS — F411 Generalized anxiety disorder: Secondary | ICD-10-CM | POA: Diagnosis not present

## 2011-11-26 DIAGNOSIS — D869 Sarcoidosis, unspecified: Secondary | ICD-10-CM

## 2011-11-26 DIAGNOSIS — J309 Allergic rhinitis, unspecified: Secondary | ICD-10-CM | POA: Diagnosis not present

## 2011-11-26 DIAGNOSIS — I10 Essential (primary) hypertension: Secondary | ICD-10-CM

## 2011-11-26 DIAGNOSIS — E538 Deficiency of other specified B group vitamins: Secondary | ICD-10-CM

## 2011-11-26 DIAGNOSIS — J019 Acute sinusitis, unspecified: Secondary | ICD-10-CM

## 2011-11-26 DIAGNOSIS — K219 Gastro-esophageal reflux disease without esophagitis: Secondary | ICD-10-CM

## 2011-11-26 DIAGNOSIS — E1165 Type 2 diabetes mellitus with hyperglycemia: Secondary | ICD-10-CM

## 2011-11-26 LAB — TSH: TSH: 1.47 u[IU]/mL (ref 0.35–5.50)

## 2011-11-26 LAB — HEPATIC FUNCTION PANEL
AST: 33 U/L (ref 0–37)
Albumin: 3.2 g/dL — ABNORMAL LOW (ref 3.5–5.2)

## 2011-11-26 LAB — BASIC METABOLIC PANEL
Calcium: 9 mg/dL (ref 8.4–10.5)
Chloride: 100 mEq/L (ref 96–112)
Creatinine, Ser: 0.9 mg/dL (ref 0.4–1.2)

## 2011-11-26 LAB — HEMOGLOBIN A1C: Hgb A1c MFr Bld: 6.1 % (ref 4.6–6.5)

## 2011-11-26 LAB — SEDIMENTATION RATE: Sed Rate: 48 mm/hr — ABNORMAL HIGH (ref 0–22)

## 2011-11-26 LAB — VITAMIN B12: Vitamin B-12: 442 pg/mL (ref 211–911)

## 2011-11-26 MED ORDER — AMOXICILLIN-POT CLAVULANATE 875-125 MG PO TABS: 1.0000 | ORAL_TABLET | Freq: Two times a day (BID) | ORAL | Status: DC

## 2011-11-26 MED ORDER — OXYCODONE-ACETAMINOPHEN 5-325 MG PO TABS: 1.0000 | ORAL_TABLET | Freq: Four times a day (QID) | ORAL | Status: DC | PRN

## 2011-11-26 MED ORDER — PREDNISOLONE 5 MG PO TABS: ORAL_TABLET | ORAL | Status: DC

## 2011-11-26 NOTE — Progress Notes (Signed)
   Subjective:    Patient ID: [name], female    DOB: [birth year], 51 y.o.   MRN: [id number]  HPI   C/o L knee pain  following the fall  - better post-injection. B knee pain is off and on... C/o R knee pain The patient presents for a follow-up of  chronic hypertension, chronic dyslipidemia, type 2 diabetes controlled with medicines  She lost wt on diet    Wt Readings from Last 3 Encounters:  11/26/11 303 lb (137.44 kg)  09/12/11 290 lb (131.543 kg)  09/03/11 291 lb 8 oz (132.224 kg)   BP Readings from Last 3 Encounters:  11/26/11 110/68  09/12/11 130/74  09/03/11 110/80      Review of Systems  Constitutional: Positive for fatigue and unexpected weight change. Negative for activity change and appetite change.  HENT: Negative for congestion, mouth sores and sinus pressure.   Eyes: Negative for visual disturbance.  Respiratory: Negative for cough and chest tightness.   Genitourinary: Negative for difficulty urinating and vaginal pain.  Musculoskeletal: Negative for back pain and gait problem.  Skin: Negative for pallor and rash.  Neurological: Negative for dizziness, tremors and weakness.  Psychiatric/Behavioral: Positive for sleep disturbance. Negative for suicidal ideas, hallucinations, confusion and agitation. The patient is nervous/anxious.         Objective:   Physical Exam  Constitutional: She appears well-developed. No distress.       Obese   HENT:  Head: Normocephalic.  Right Ear: External ear normal.  Left Ear: External ear normal.  Nose: Nose normal.  Mouth/Throat: Oropharynx is clear and moist. No oropharyngeal exudate.       Caries; missing front teeth  Eyes: Conjunctivae normal are normal. Pupils are equal, round, and reactive to light. Right eye exhibits no discharge. Left eye exhibits no discharge.  Neck: Normal range of motion. Neck supple. No JVD present. No tracheal deviation present. No thyromegaly present.  Cardiovascular: Normal  rate, regular rhythm and normal heart sounds.   Pulmonary/Chest: No stridor. No respiratory distress. She has no wheezes.  Abdominal: Soft. Bowel sounds are normal. She exhibits no distension and no mass. There is no tenderness. There is no rebound and no guarding.  Musculoskeletal: She exhibits no edema and no tenderness.  Lymphadenopathy:    She has no cervical adenopathy.  Neurological: She displays normal reflexes. No cranial nerve deficit. She exhibits normal muscle tone. Coordination normal.  Skin: No rash noted. No erythema.  Psychiatric: She has a normal mood and affect. Her behavior is normal. Judgment and thought content normal.    R knee is tender     Lab Results  Component Value Date   WBC 7.2 05/09/2011   HGB 15.6* 05/09/2011   HCT 45.8 05/09/2011   PLT 211.0 05/09/2011   CHOL 193 06/29/2008   TRIG 145.0 06/29/2008   HDL 67.50 06/29/2008   ALT 77* 05/09/2011   AST 62* 05/09/2011   NA 133* 05/09/2011   K 4.1 05/09/2011   CL 90* 05/09/2011   CREATININE 1.0 05/09/2011   BUN 9 05/09/2011   CO2 27 05/09/2011   TSH 4.30 05/09/2011   INR 1.01 10/01/2010   HGBA1C 16.4* 05/09/2011   MICROALBUR 0.4 06/29/2008     Assessment & Plan:

## 2011-11-26 NOTE — Assessment & Plan Note (Signed)
Continue with current prescription therapy as reflected on the Med list.  

## 2011-11-26 NOTE — Assessment & Plan Note (Signed)
Continue with current prescription therapy as reflected on the Med list. Wt loss 

## 2011-11-26 NOTE — Assessment & Plan Note (Signed)
Labs  Continue with current prescription therapy as reflected on the Med list.  

## 2011-11-26 NOTE — Assessment & Plan Note (Signed)
Abx

## 2011-11-26 NOTE — Assessment & Plan Note (Signed)
She would like to go off Prednisone - Rhem consult

## 2011-11-27 ENCOUNTER — Other Ambulatory Visit: Payer: Self-pay | Admitting: *Deleted

## 2011-11-27 MED ORDER — NORTRIPTYLINE HCL 25 MG PO CAPS: ORAL_CAPSULE | ORAL | Status: DC

## 2011-11-27 NOTE — Telephone Encounter (Signed)
R'cd fax from [facility] for refill of Nortriptyline.

## 2011-12-03 ENCOUNTER — Telehealth: Payer: Self-pay | Admitting: Internal Medicine

## 2011-12-03 NOTE — Telephone Encounter (Signed)
Ok Thx 

## 2011-12-03 NOTE — Telephone Encounter (Signed)
Pt has an RX for oxycodone 5mg .  She wants to bring it back and get one for 10 mg.  She states she takes 10 mg.

## 2011-12-04 MED ORDER — OXYCODONE-ACETAMINOPHEN 10-325 MG PO TABS: 1.0000 | ORAL_TABLET | Freq: Four times a day (QID) | ORAL | Status: DC | PRN

## 2011-12-04 NOTE — Telephone Encounter (Signed)
Pt informed see updated med list.

## 2011-12-20 ENCOUNTER — Other Ambulatory Visit: Payer: Self-pay | Admitting: *Deleted

## 2011-12-20 MED ORDER — POTASSIUM CHLORIDE CRYS ER 10 MEQ PO TBCR: 10.0000 meq | EXTENDED_RELEASE_TABLET | Freq: Every day | ORAL | Status: DC

## 2011-12-31 ENCOUNTER — Telehealth: Payer: Self-pay | Admitting: *Deleted

## 2011-12-31 MED ORDER — OXYCODONE-ACETAMINOPHEN 10-325 MG PO TABS: 1.0000 | ORAL_TABLET | Freq: Four times a day (QID) | ORAL | Status: DC | PRN

## 2011-12-31 NOTE — Telephone Encounter (Signed)
Pt req Rf Oxycodone. Please advise.

## 2011-12-31 NOTE — Telephone Encounter (Signed)
Rx printed. Pt informed.

## 2011-12-31 NOTE — Telephone Encounter (Signed)
OK to fill this prescription with additional refills x0 Thank you!  

## 2012-01-01 ENCOUNTER — Ambulatory Visit (INDEPENDENT_AMBULATORY_CARE_PROVIDER_SITE_OTHER): Payer: Medicare Other | Admitting: Endocrinology

## 2012-01-01 ENCOUNTER — Encounter: Payer: Self-pay | Admitting: Endocrinology

## 2012-01-01 ENCOUNTER — Ambulatory Visit
Admission: RE | Admit: 2012-01-01 | Discharge: 2012-01-01 | Disposition: A | Discharge status: Home / Self Care | Payer: Medicare Other | Source: Ambulatory Visit | Attending: Endocrinology | Admitting: Endocrinology

## 2012-01-01 VITALS — BP 118/68 | HR 80 | Temp 98.8°F | Wt 306.0 lb

## 2012-01-01 DIAGNOSIS — R062 Wheezing: Secondary | ICD-10-CM | POA: Diagnosis not present

## 2012-01-01 DIAGNOSIS — R05 Cough: Secondary | ICD-10-CM

## 2012-01-01 DIAGNOSIS — E1165 Type 2 diabetes mellitus with hyperglycemia: Secondary | ICD-10-CM

## 2012-01-01 MED ORDER — FLUTICASONE-SALMETEROL 100-50 MCG/DOSE IN AEPB: 1.0000 | INHALATION_SPRAY | Freq: Two times a day (BID) | RESPIRATORY_TRACT | Status: DC

## 2012-01-01 MED ORDER — BENZONATATE 100 MG PO CAPS: 100.0000 mg | ORAL_CAPSULE | Freq: Three times a day (TID) | ORAL | Status: DC | PRN

## 2012-01-01 NOTE — Patient Instructions (Addendum)
check your blood sugar once a day.  vary the time of day when you check, between before the 3 meals, and at bedtime.  also check if you have symptoms of your blood sugar being too high or too low.  please keep a record of the readings and bring it to your next appointment here.  please call us sooner if your blood sugar goes below 70, or if it stays over 200.  reduce lantus back to 40 units each morning.   Please continue novolog, 10 units daily, with the evening meal.  Please come back for a follow-up appointment in 2 months.  i have sent 2 prescriptions to your pharmacy: for cough pills, and an inhaler.  I hope you feel better soon.  If you don't feel better by next week, please dr plotnikov.  Please call sooner if you get worse.  A chest-x-ray is requested for you today.  We'll contact you with results.

## 2012-01-01 NOTE — Progress Notes (Signed)
Subjective:    Patient ID: [name], female    DOB: October 16, 1960, 51 y.o.   MRN: [id number]  HPI Pt reutrns for f/u of IDDM (dx'ed 2013--no known complications; therapy has been limited by pt's need for a simple regimen).  A few days ago, she had an episode of mild hypoglycemia in the morning.  no cbg record, but states cbg's are highest before the evening meal.  Her prednisone has been reduced to 5 mg daily.   Pt states few days of moderate prod-quality cough in the chest, and assoc wheezing. Past Medical History  Diagnosis Date  . Abdominal pain     rlq  . Urinary tract infection, site not specified   . Sinusitis   . Osteoarthritis   . Sarcoidosis     Dr. Wilford Grist  . Esophageal reflux   . Depression   . Anxiety   . Vitamin D deficiency 2009  . Vitamin B 12 deficiency 2009  . Ruptured appendix   . Hypertension   . OSA (obstructive sleep apnea)   . Allergic rhinitis   . ABDOMINAL PAIN RIGHT LOWER QUADRANT 02/07/2007  . ALLERGIC RHINITIS 10/26/2009  . ANXIETY 08/14/2006  . B12 DEFICIENCY 08/25/2007  . DEPRESSION 08/14/2006  . DYSPNEA 04/28/2009  . ELEVATED BP 07/10/2007  . GRIEF REACTION 08/01/2009  . HYPERGLYCEMIA 12/30/2008  . HYPERTENSION 08/27/2007  . INSOMNIA, CHRONIC 03/07/2010  . NAUSEA 03/07/2010  . OBESITY 03/07/2010  . VISUAL CHANGES 07/02/2007  . VITAMIN D DEFICIENCY 08/25/2007  . Shortness of breath   . Family history of breast cancer     sister    Past Surgical History  Procedure Date  . Arthroscopic knee surgery 11-12-04    L  . Appendectomy   . Endometrial ablation   . Axillary abcess irrigation and debridement Jul & Aug2012    History   Social History  . Marital Status: Single    Spouse Name: N/A    Number of Children: N/A  . Years of Education: N/A   Occupational History  . disabled    Social History Main Topics  . Smoking status: Former Games developer  . Smokeless tobacco: Not on file  . Alcohol Use: No  . Drug Use: No  . Sexually Active:  Not Currently   Other Topics Concern  . Not on file   Social History Narrative   Single, broke up with partner in 2008.    Current Outpatient Prescriptions on File Prior to Visit  Medication Sig Dispense Refill  . aspirin EC 81 MG tablet Take 81 mg by mouth daily.        . busPIRone (BUSPAR) 10 MG tablet TAKE 1 TABLET BY MOUTH TWICE DAILY  180 tablet  1  . carvedilol (COREG) 12.5 MG tablet Take 1 tablet (12.5 mg total) by mouth 2 (two) times daily with a meal.  180 tablet  1  . Cholecalciferol (VITAMIN D3) 1000 UNITS tablet Take 2,000 Units by mouth daily.        . citalopram (CELEXA) 40 MG tablet TAKE 1 &1/2 TABLETS BY MOUTH DAILY  135 tablet  1  . Cyanocobalamin (VITAMIN B-12 CR) 1000 MCG TBCR Take 500 mcg by mouth daily.        . fexofenadine (ALLEGRA) 180 MG tablet Take 180 mg by mouth as needed.        . fexofenadine-pseudoephedrine (ALLEGRA-D) 60-120 MG per tablet Take 1 tablet by mouth 2 (two) times daily.        [name]  furosemide (LASIX) 40 MG tablet Take 1 tablet (40 mg total) by mouth daily as needed (swelling).  30 tablet  5  . glucose blood (FREESTYLE LITE) test strip 1 each by Other route 2 (two) times daily. Use as instructed  100 each  12  . insulin aspart (NOVOLOG FLEXPEN) 100 UNIT/ML injection Inject 10 Units into the skin daily. With the evening meal      . insulin glargine (LANTUS) 100 UNIT/ML injection Inject 40 Units into the skin every morning.       [name] ketoconazole (NIZORAL) 2 % cream APPLY TOPICALLY DAILY  45 g  1  . Lancets (FREESTYLE) lancets 1 each by Other route 2 (two) times daily. Use as instructed  100 each  12  . metoCLOPramide (REGLAN) 10 MG tablet TAKE 1 TABLET BY MOUTH THREE TIMES DAILY BEFORE A MEAL  270 tablet  1  . NEXIUM 40 MG capsule TAKE ONE CAPSULE BY MOUTH EVERY MORNING  90 capsule  1  . nortriptyline (PAMELOR) 25 MG capsule TAKE 1 TO 2 CAPSULES BY MOUTH AT BEDTIME FOR INSOMNIA  180 capsule  1  . Omega-3 Fatty Acids (FISH OIL) 1000 MG CAPS Take by  mouth daily.        [name] oxyCODONE-acetaminophen (PERCOCET) 10-325 MG per tablet Take 1 tablet by mouth every 6 (six) hours as needed for pain.  120 tablet  0  . potassium chloride (K-DUR,KLOR-CON) 10 MEQ tablet Take 1 tablet (10 mEq total) by mouth daily.  90 tablet  1  . triamcinolone (KENALOG) 0.5 % cream Apply topically 2 (two) times daily.        . Fluticasone-Salmeterol (ADVAIR) 100-50 MCG/DOSE AEPB Inhale 1 puff into the lungs 2 (two) times daily.  1 each  3    Allergies  Allergen Reactions  . Enalapril Maleate     REACTION: cough  . Hydrochlorothiazide     REACTION: Low potassium  . Hydroxychloroquine Sulfate     REACTION: vision changes    Family History  Problem Relation Age of Onset  . Hypertension    . Coronary artery disease      female 1st degree relative <60  . Heart failure      congestive  . Coronary artery disease      Female 1st degree relative <50  . Breast cancer      1st degree relative <50 S  . Heart disease Mother   . Kidney disease Mother   . Cancer Father     leukemia    BP 118/68  Pulse 80  Temp 98.8 F (37.1 C) (Oral)  Wt 306 lb (138.801 kg)  SpO2 97%    Review of Systems Denies LOC and fever    Objective:   Physical Exam VITAL SIGNS:  See vs page GENERAL: no distress LUNGS:  Clear to auscultation SKIN:  Insulin injection sites at the anterior abdomen are normal, except for a few ecchymoses.  Lab Results  Component Value Date   HGBA1C 6.1 11/26/2011      Assessment & Plan:  DM, therapy limited by pt's need for a simple regimen.  overcontrolled. Glenford Peers, new

## 2012-01-13 ENCOUNTER — Other Ambulatory Visit: Payer: Self-pay | Admitting: *Deleted

## 2012-01-13 ENCOUNTER — Telehealth: Payer: Self-pay | Admitting: Internal Medicine

## 2012-01-13 MED ORDER — METOCLOPRAMIDE HCL 10 MG PO TABS: 10.0000 mg | ORAL_TABLET | Freq: Three times a day (TID) | ORAL | Status: DC

## 2012-01-13 MED ORDER — BUSPIRONE HCL 10 MG PO TABS: 10.0000 mg | ORAL_TABLET | Freq: Two times a day (BID) | ORAL | Status: DC

## 2012-01-13 NOTE — Telephone Encounter (Signed)
Patient Information:  Caller Name: [name]  Phone: [phone]  Patient: [name], [name]  Gender: Female  DOB: September 20, 1960  Age: 51 Years  PCP: Plotnikov, Alex (Adults only)  Pregnant: No  Office Follow Up:  Does the office need to follow up with this patient?: Yes  Instructions For The Office: Needs appt please  RN Note:  She describes the rash as irregular shaped areas.  Symptoms  Reason For Call & Symptoms: Is now on Prednisone 5mg  qd and has developed a rash on her back, legs, under arms /they are red and sore and very itchy  Reviewed Health History In EMR: Yes  Reviewed Medications In EMR: Yes  Reviewed Allergies In EMR: Yes  Reviewed Surgeries / Procedures: Yes  Date of Onset of Symptoms: 01/06/2012 OB / GYN:  LMP: Unknown  Guideline(s) Used:  Rash or Redness - Widespread  Hives  Disposition Per Guideline:   See Today in Office  Reason For Disposition Reached:   Hives have become worse and taking oral steroids (e.g., prednisone) > 24 hours  Advice Given:  N/A

## 2012-01-13 NOTE — Telephone Encounter (Signed)
Benadryl prn OV w/any MD Thx

## 2012-01-13 NOTE — Addendum Note (Signed)
Addended by: Merrilyn Puma on: 01/13/2012 10:29 AM   Modules accepted: Orders

## 2012-01-14 NOTE — Telephone Encounter (Signed)
Pt informed of MD's advisement. 

## 2012-01-20 ENCOUNTER — Encounter: Payer: Self-pay | Admitting: Internal Medicine

## 2012-01-20 ENCOUNTER — Ambulatory Visit (INDEPENDENT_AMBULATORY_CARE_PROVIDER_SITE_OTHER): Payer: Medicare Other | Admitting: Internal Medicine

## 2012-01-20 VITALS — BP 118/72 | HR 76 | Resp 16 | Wt 309.0 lb

## 2012-01-20 DIAGNOSIS — D869 Sarcoidosis, unspecified: Secondary | ICD-10-CM | POA: Diagnosis not present

## 2012-01-20 DIAGNOSIS — I10 Essential (primary) hypertension: Secondary | ICD-10-CM

## 2012-01-20 DIAGNOSIS — M25569 Pain in unspecified knee: Secondary | ICD-10-CM | POA: Diagnosis not present

## 2012-01-20 DIAGNOSIS — E669 Obesity, unspecified: Secondary | ICD-10-CM

## 2012-01-20 DIAGNOSIS — L219 Seborrheic dermatitis, unspecified: Secondary | ICD-10-CM | POA: Insufficient documentation

## 2012-01-20 DIAGNOSIS — E538 Deficiency of other specified B group vitamins: Secondary | ICD-10-CM

## 2012-01-20 DIAGNOSIS — F329 Major depressive disorder, single episode, unspecified: Secondary | ICD-10-CM

## 2012-01-20 DIAGNOSIS — R7309 Other abnormal glucose: Secondary | ICD-10-CM

## 2012-01-20 MED ORDER — CLOTRIMAZOLE-BETAMETHASONE 1-0.05 % EX CREA: TOPICAL_CREAM | Freq: Two times a day (BID) | CUTANEOUS | Status: DC

## 2012-01-20 NOTE — Patient Instructions (Addendum)
Take Prednisone 7.5 or 10 mg a day

## 2012-01-20 NOTE — Assessment & Plan Note (Signed)
Continue with current prescription therapy as reflected on the Med list.  

## 2012-01-20 NOTE — Assessment & Plan Note (Signed)
Wt Readings from Last 3 Encounters:  01/20/12 309 lb (140.161 kg)  01/01/12 306 lb (138.801 kg)  11/26/11 303 lb (137.44 kg)

## 2012-01-20 NOTE — Progress Notes (Signed)
   Subjective:    HPI   F/u L knee pain - better C/o R knee pain in the back The patient presents for a follow-up of  chronic hypertension, chronic dyslipidemia, type 2 diabetes controlled with medicines C/o rash on face C/o sarcoid flare up (now on Predn 5 mg a day)      Wt Readings from Last 3 Encounters:  01/20/12 309 lb (140.161 kg)  01/01/12 306 lb (138.801 kg)  11/26/11 303 lb (137.44 kg)   BP Readings from Last 3 Encounters:  01/20/12 118/72  01/01/12 118/68  11/26/11 110/68      Review of Systems  Constitutional: Positive for unexpected weight change. Negative for activity change and appetite change.  HENT: Negative for mouth sores and sinus pressure.   Eyes: Negative for visual disturbance.  Respiratory: Negative for chest tightness.   Genitourinary: Negative for difficulty urinating and vaginal pain.  Musculoskeletal: Negative for back pain and gait problem.  Skin: Negative for pallor.  Neurological: Negative for dizziness, tremors and weakness.  Psychiatric/Behavioral: Positive for sleep disturbance. Negative for suicidal ideas, hallucinations, confusion and agitation. The patient is nervous/anxious.         Objective:   Physical Exam  Constitutional: She appears well-developed. No distress.       Obese   HENT:  Head: Normocephalic.  Right Ear: External ear normal.  Left Ear: External ear normal.  Nose: Nose normal.  Mouth/Throat: Oropharynx is clear and moist. No oropharyngeal exudate.       Caries; missing front teeth  Eyes: Conjunctivae normal are normal. Pupils are equal, round, and reactive to light. Right eye exhibits no discharge. Left eye exhibits no discharge.  Neck: Normal range of motion. Neck supple. No JVD present. No tracheal deviation present. No thyromegaly present.  Cardiovascular: Normal rate, regular rhythm and normal heart sounds.   Pulmonary/Chest: No stridor. No respiratory distress. She has no wheezes.  Abdominal: Soft.  Bowel sounds are normal. She exhibits no distension and no mass. There is no tenderness. There is no rebound and no guarding.  Musculoskeletal: She exhibits no edema and no tenderness.  Lymphadenopathy:    She has no cervical adenopathy.  Neurological: She displays normal reflexes. No cranial nerve deficit. She exhibits normal muscle tone. Coordination normal.  Skin: No rash noted. No erythema.  Psychiatric: She has a normal mood and affect. Her behavior is normal. Judgment and thought content normal.  SD on face Sarcoid lesions on L thigh, L arm, face  R knee is tender posterior     Lab Results  Component Value Date   WBC 7.2 05/09/2011   HGB 15.6* 05/09/2011   HCT 45.8 05/09/2011   PLT 211.0 05/09/2011   CHOL 193 06/29/2008   TRIG 145.0 06/29/2008   HDL 67.50 06/29/2008   ALT 50* 11/26/2011   AST 33 11/26/2011   NA 135 11/26/2011   K 4.4 11/26/2011   CL 100 11/26/2011   CREATININE 0.9 11/26/2011   BUN 14 11/26/2011   CO2 26 11/26/2011   TSH 1.47 11/26/2011   INR 1.01 10/01/2010   HGBA1C 6.1 11/26/2011   MICROALBUR 0.4 06/29/2008     Assessment & Plan:

## 2012-01-20 NOTE — Assessment & Plan Note (Signed)
lotrisone bid  °

## 2012-01-20 NOTE — Assessment & Plan Note (Signed)
1/14 - flare up on Predn 5 mg/d Predn 7.5 -10 mg/d

## 2012-01-20 NOTE — Assessment & Plan Note (Signed)
Poss Baker cyst

## 2012-01-22 ENCOUNTER — Other Ambulatory Visit: Payer: Self-pay | Admitting: Endocrinology

## 2012-01-22 NOTE — Telephone Encounter (Signed)
Please refer request to dr plotnikov.

## 2012-01-30 ENCOUNTER — Other Ambulatory Visit: Payer: Self-pay | Admitting: Internal Medicine

## 2012-01-30 NOTE — Telephone Encounter (Signed)
Patient requesting refill on oxycodone, call when ready for pick up

## 2012-01-31 NOTE — Telephone Encounter (Signed)
Ok Thx 

## 2012-02-03 MED ORDER — OXYCODONE-ACETAMINOPHEN 10-325 MG PO TABS: 1.0000 | ORAL_TABLET | Freq: Four times a day (QID) | ORAL | Status: DC | PRN

## 2012-02-03 NOTE — Telephone Encounter (Signed)
Rx printed/ Pt informed Rx ready for p/u.

## 2012-02-05 DIAGNOSIS — M25569 Pain in unspecified knee: Secondary | ICD-10-CM | POA: Diagnosis not present

## 2012-02-05 DIAGNOSIS — Z79899 Other long term (current) drug therapy: Secondary | ICD-10-CM | POA: Diagnosis not present

## 2012-02-05 DIAGNOSIS — L259 Unspecified contact dermatitis, unspecified cause: Secondary | ICD-10-CM | POA: Diagnosis not present

## 2012-02-05 DIAGNOSIS — D869 Sarcoidosis, unspecified: Secondary | ICD-10-CM | POA: Diagnosis not present

## 2012-02-25 DIAGNOSIS — D869 Sarcoidosis, unspecified: Secondary | ICD-10-CM | POA: Diagnosis not present

## 2012-02-25 DIAGNOSIS — M25569 Pain in unspecified knee: Secondary | ICD-10-CM | POA: Diagnosis not present

## 2012-02-25 DIAGNOSIS — L259 Unspecified contact dermatitis, unspecified cause: Secondary | ICD-10-CM | POA: Diagnosis not present

## 2012-02-27 ENCOUNTER — Ambulatory Visit: Payer: Medicare Other | Admitting: Internal Medicine

## 2012-03-02 ENCOUNTER — Telehealth: Payer: Self-pay | Admitting: Internal Medicine

## 2012-03-02 ENCOUNTER — Other Ambulatory Visit: Payer: Self-pay

## 2012-03-02 NOTE — Telephone Encounter (Signed)
Pt is requesting a refill of her pain medication.  Pt needs Oxycodone 10/325 refilled.  RN viewed in EPIC where pt had called earlier regarding the refill.  OFFICE PLEASE FOLLOW UP WITH PT WHEN RX IS READY FOR REFILL

## 2012-03-02 NOTE — Telephone Encounter (Signed)
Pt called [facility] requesting pain med refill. Call when ready to pick up

## 2012-03-02 NOTE — Telephone Encounter (Signed)
Ok Thx 

## 2012-03-03 ENCOUNTER — Ambulatory Visit (INDEPENDENT_AMBULATORY_CARE_PROVIDER_SITE_OTHER): Payer: Medicare Other | Admitting: Endocrinology

## 2012-03-03 ENCOUNTER — Encounter: Payer: Self-pay | Admitting: Endocrinology

## 2012-03-03 VITALS — BP 126/80 | HR 94 | Wt 319.0 lb

## 2012-03-03 DIAGNOSIS — IMO0001 Reserved for inherently not codable concepts without codable children: Secondary | ICD-10-CM

## 2012-03-03 DIAGNOSIS — E1165 Type 2 diabetes mellitus with hyperglycemia: Secondary | ICD-10-CM

## 2012-03-03 DIAGNOSIS — IMO0002 Reserved for concepts with insufficient information to code with codable children: Secondary | ICD-10-CM

## 2012-03-03 LAB — HEMOGLOBIN A1C: Hgb A1c MFr Bld: 6.1 % (ref 4.6–6.5)

## 2012-03-03 LAB — MICROALBUMIN / CREATININE URINE RATIO: Microalb Creat Ratio: 0.3 mg/g (ref 0.0–30.0)

## 2012-03-03 MED ORDER — OXYCODONE-ACETAMINOPHEN 10-325 MG PO TABS: 1.0000 | ORAL_TABLET | Freq: Four times a day (QID) | ORAL | Status: DC | PRN

## 2012-03-03 MED ORDER — CEFUROXIME AXETIL 250 MG PO TABS: 250.0000 mg | ORAL_TABLET | Freq: Two times a day (BID) | ORAL | Status: DC

## 2012-03-03 NOTE — Telephone Encounter (Signed)
rx upfront for p/u. Pt informed. 

## 2012-03-03 NOTE — Patient Instructions (Addendum)
check your blood sugar once a day.  vary the time of day when you check, between before the 3 meals, and at bedtime.  also check if you have symptoms of your blood sugar being too high or too low.  please keep a record of the readings and bring it to your next appointment here.  please call us sooner if your blood sugar goes below 70, or if it stays over 200.  reduce lantus to 35 units each morning.   Please continue novolog, 10 units daily, with the evening meal.  Please come back for a follow-up appointment in 3 months.  i have sent a prescription to your pharmacy, for an antibiotic pill I hope you feel better soon.  If you don't feel better by next week, please dr plotnikov.  Please call sooner if you get worse.  blood tests are being requested for you today.  We'll contact you with results.

## 2012-03-03 NOTE — Telephone Encounter (Signed)
Rx printed/pending MD sig. 

## 2012-03-03 NOTE — Progress Notes (Signed)
Subjective:    Patient ID: [name], female    DOB: September 09, 1960, 52 y.o.   MRN: [id number]  HPI Pt reutrns for f/u of IDDM (dx'ed 2013--no known complications; therapy has been limited by pt's need for a simple regimen).  no cbg record, but states cbg's are frequently mildly low in the morning or before the evening meal.  Her prednisone has is still at 5 mg daily. Pt states 2 weeks of slight prod-quality cough in the chest, and assoc wheezing.  Past Medical History  Diagnosis Date  . Abdominal pain     rlq  . Urinary tract infection, site not specified   . Sinusitis   . Osteoarthritis   . Sarcoidosis     Dr. Wilford Grist  . Esophageal reflux   . Depression   . Anxiety   . Vitamin D deficiency 2009  . Vitamin B 12 deficiency 2009  . Ruptured appendix   . Hypertension   . OSA (obstructive sleep apnea)   . Allergic rhinitis   . ABDOMINAL PAIN RIGHT LOWER QUADRANT 02/07/2007  . ALLERGIC RHINITIS 10/26/2009  . ANXIETY 08/14/2006  . B12 DEFICIENCY 08/25/2007  . DEPRESSION 08/14/2006  . DYSPNEA 04/28/2009  . ELEVATED BP 07/10/2007  . GRIEF REACTION 08/01/2009  . HYPERGLYCEMIA 12/30/2008  . HYPERTENSION 08/27/2007  . INSOMNIA, CHRONIC 03/07/2010  . NAUSEA 03/07/2010  . OBESITY 03/07/2010  . VISUAL CHANGES 07/02/2007  . VITAMIN D DEFICIENCY 08/25/2007  . Shortness of breath   . Family history of breast cancer     sister    Past Surgical History  Procedure Laterality Date  . Arthroscopic knee surgery  11-12-04    L  . Appendectomy    . Endometrial ablation    . Axillary abcess irrigation and debridement  Jul & Aug2012    History   Social History  . Marital Status: Single    Spouse Name: N/A    Number of Children: N/A  . Years of Education: N/A   Occupational History  . disabled    Social History Main Topics  . Smoking status: Former Games developer  . Smokeless tobacco: Not on file  . Alcohol Use: No  . Drug Use: No  . Sexually Active: Not Currently   Other Topics  Concern  . Not on file   Social History Narrative   Single, broke up with partner in 2008.    Current Outpatient Prescriptions on File Prior to Visit  Medication Sig Dispense Refill  . aspirin EC 81 MG tablet Take 81 mg by mouth daily.        . busPIRone (BUSPAR) 10 MG tablet Take 1 tablet (10 mg total) by mouth 2 (two) times daily.  180 tablet  1  . carvedilol (COREG) 12.5 MG tablet Take 1 tablet (12.5 mg total) by mouth 2 (two) times daily with a meal.  180 tablet  1  . Cholecalciferol (VITAMIN D3) 1000 UNITS tablet Take 2,000 Units by mouth daily.        . citalopram (CELEXA) 40 MG tablet TAKE 1 &1/2 TABLETS BY MOUTH DAILY  135 tablet  1  . clotrimazole-betamethasone (LOTRISONE) cream Apply topically 2 (two) times daily.  30 g  0  . Cyanocobalamin (VITAMIN B-12 CR) 1000 MCG TBCR Take 500 mcg by mouth daily.        . fexofenadine (ALLEGRA) 180 MG tablet Take 180 mg by mouth as needed.        . fexofenadine-pseudoephedrine (ALLEGRA-D) 60-120 MG per tablet Take  1 tablet by mouth 2 (two) times daily.        . Fluticasone-Salmeterol (ADVAIR) 100-50 MCG/DOSE AEPB Inhale 1 puff into the lungs 2 (two) times daily.  1 each  3  . furosemide (LASIX) 40 MG tablet Take 1 tablet (40 mg total) by mouth daily as needed (swelling).  30 tablet  5  . glucose blood (FREESTYLE LITE) test strip 1 each by Other route 2 (two) times daily. Use as instructed  100 each  12  . insulin aspart (NOVOLOG FLEXPEN) 100 UNIT/ML injection Inject 10 Units into the skin daily. With the evening meal      . insulin glargine (LANTUS) 100 UNIT/ML injection Inject 35 Units into the skin every morning.       [name] ketoconazole (NIZORAL) 2 % cream APPLY TOPICALLY DAILY  45 g  1  . Lancets (FREESTYLE) lancets 1 each by Other route 2 (two) times daily. Use as instructed  100 each  12  . metoCLOPramide (REGLAN) 10 MG tablet Take 1 tablet (10 mg total) by mouth 3 (three) times daily.  270 tablet  1  . NEXIUM 40 MG capsule TAKE ONE CAPSULE  BY MOUTH EVERY MORNING  90 capsule  1  . nortriptyline (PAMELOR) 25 MG capsule TAKE 1 TO 2 CAPSULES BY MOUTH AT BEDTIME FOR INSOMNIA  180 capsule  1  . Omega-3 Fatty Acids (FISH OIL) 1000 MG CAPS Take by mouth daily.        [name] oxyCODONE-acetaminophen (PERCOCET) 10-325 MG per tablet Take 1 tablet by mouth every 6 (six) hours as needed for pain. Please fill on or after 03/02/12  120 tablet  0  . potassium chloride (K-DUR,KLOR-CON) 10 MEQ tablet Take 1 tablet (10 mEq total) by mouth daily.  90 tablet  1  . triamcinolone (KENALOG) 0.5 % cream Apply topically 2 (two) times daily.         No current facility-administered medications on file prior to visit.    Allergies  Allergen Reactions  . Enalapril Maleate     REACTION: cough  . Hydrochlorothiazide     REACTION: Low potassium  . Hydroxychloroquine Sulfate     REACTION: vision changes    Family History  Problem Relation Age of Onset  . Hypertension    . Coronary artery disease      female 1st degree relative <60  . Heart failure      congestive  . Coronary artery disease      Female 1st degree relative <50  . Breast cancer      1st degree relative <50 S  . Heart disease Mother   . Kidney disease Mother   . Cancer Father     leukemia    BP 126/80  Pulse 94  Wt 319 lb (144.697 kg)  BMI 49.95 kg/m2  SpO2 94%    Review of Systems Denies LOC and fever    Objective:   Physical Exam VITAL SIGNS:  See vs page GENERAL: no distress LUNGS:  Clear to auscultation Pulses: dorsalis pedis intact bilat.   Feet: no deformity.  no ulcer on the feet.  feet are of normal color and temp.  no edema Neuro: sensation is intact to touch on the feet   Lab Results  Component Value Date   HGBA1C 6.1 03/03/2012      Assessment & Plan:  Acute bronchitis, new DM, overcontrolled

## 2012-03-05 ENCOUNTER — Telehealth: Payer: Self-pay | Admitting: Endocrinology

## 2012-03-05 MED ORDER — BENZONATATE 100 MG PO CAPS: ORAL_CAPSULE | ORAL | Status: DC

## 2012-03-05 NOTE — Telephone Encounter (Signed)
sent 

## 2012-03-05 NOTE — Telephone Encounter (Signed)
The patient left message on office voicemail to request refill of the generic Tessalon 100 mg capsules.  The patient reports having "nagging cough" and would like medication to help relieve her symptoms.  The patient would like return call at [phone]

## 2012-03-18 DIAGNOSIS — M25569 Pain in unspecified knee: Secondary | ICD-10-CM | POA: Diagnosis not present

## 2012-03-18 DIAGNOSIS — D869 Sarcoidosis, unspecified: Secondary | ICD-10-CM | POA: Diagnosis not present

## 2012-03-18 DIAGNOSIS — E669 Obesity, unspecified: Secondary | ICD-10-CM | POA: Diagnosis not present

## 2012-03-24 ENCOUNTER — Telehealth: Payer: Self-pay | Admitting: *Deleted

## 2012-03-24 DIAGNOSIS — R0600 Dyspnea, unspecified: Secondary | ICD-10-CM

## 2012-03-24 NOTE — Telephone Encounter (Signed)
Ok Done Thx 

## 2012-03-24 NOTE — Telephone Encounter (Signed)
Pt requesting referral to Dr. Shelle Iron for SOB.

## 2012-03-30 ENCOUNTER — Telehealth: Payer: Self-pay | Admitting: Internal Medicine

## 2012-03-30 NOTE — Telephone Encounter (Signed)
OK to fill this prescription with additional refills x0 Needs ov Thank you!

## 2012-03-30 NOTE — Telephone Encounter (Signed)
Caller: Connelly/Patient; Phone: [phone]; Reason for Call: Patient needs a refill of her Oxycodone 10mg .  She reports this is a prescription she has to pick up from the office every month.  Please call her when the prescription may be picked up from the office.

## 2012-03-30 NOTE — Telephone Encounter (Signed)
Pt is requesting a referral to an orthopedic doctor for the bakers cyst on the back of her knee.

## 2012-03-31 ENCOUNTER — Telehealth: Payer: Self-pay

## 2012-03-31 DIAGNOSIS — M1710 Unilateral primary osteoarthritis, unspecified knee: Secondary | ICD-10-CM

## 2012-03-31 MED ORDER — OXYCODONE-ACETAMINOPHEN 10-325 MG PO TABS: 1.0000 | ORAL_TABLET | Freq: Four times a day (QID) | ORAL | Status: DC | PRN

## 2012-03-31 NOTE — Telephone Encounter (Signed)
Pt is requesting a referral to an orthopaedist per Rheumatogist recommendation. Pt cannot be seen at [facility] or [facility] due to bad debt. Pt is requesting referral to [facility].

## 2012-03-31 NOTE — Telephone Encounter (Signed)
Pt informed, Rx in cabinet for pt pick up  

## 2012-03-31 NOTE — Telephone Encounter (Signed)
Rx printed and placed on side A counter for MD signature 

## 2012-03-31 NOTE — Telephone Encounter (Signed)
Ok Done Thx 

## 2012-04-06 DIAGNOSIS — D869 Sarcoidosis, unspecified: Secondary | ICD-10-CM | POA: Diagnosis not present

## 2012-04-06 DIAGNOSIS — R21 Rash and other nonspecific skin eruption: Secondary | ICD-10-CM | POA: Diagnosis not present

## 2012-04-09 ENCOUNTER — Other Ambulatory Visit: Payer: Self-pay | Admitting: Family Medicine

## 2012-04-09 DIAGNOSIS — M25561 Pain in right knee: Secondary | ICD-10-CM

## 2012-04-09 DIAGNOSIS — M25569 Pain in unspecified knee: Secondary | ICD-10-CM | POA: Diagnosis not present

## 2012-04-14 ENCOUNTER — Institutional Professional Consult (permissible substitution): Payer: Medicare Other | Admitting: Pulmonary Disease

## 2012-04-20 DIAGNOSIS — L439 Lichen planus, unspecified: Secondary | ICD-10-CM | POA: Diagnosis not present

## 2012-04-21 ENCOUNTER — Other Ambulatory Visit: Payer: Self-pay | Admitting: *Deleted

## 2012-04-21 ENCOUNTER — Ambulatory Visit
Admission: RE | Admit: 2012-04-21 | Discharge: 2012-04-21 | Disposition: A | Discharge status: Home / Self Care | Payer: Medicare Other | Source: Ambulatory Visit | Attending: Family Medicine | Admitting: Family Medicine

## 2012-04-21 ENCOUNTER — Ambulatory Visit: Payer: Medicare Other | Admitting: Internal Medicine

## 2012-04-21 DIAGNOSIS — M25561 Pain in right knee: Secondary | ICD-10-CM

## 2012-04-21 DIAGNOSIS — IMO0002 Reserved for concepts with insufficient information to code with codable children: Secondary | ICD-10-CM | POA: Diagnosis not present

## 2012-04-21 DIAGNOSIS — M1710 Unilateral primary osteoarthritis, unspecified knee: Secondary | ICD-10-CM | POA: Diagnosis not present

## 2012-04-21 MED ORDER — ESOMEPRAZOLE MAGNESIUM 40 MG PO CPDR: 40.0000 mg | DELAYED_RELEASE_CAPSULE | Freq: Every day | ORAL | Status: DC

## 2012-04-27 ENCOUNTER — Telehealth: Payer: Self-pay | Admitting: Internal Medicine

## 2012-04-27 NOTE — Telephone Encounter (Signed)
Patient is requesting a refill on her pain medication.

## 2012-04-28 DIAGNOSIS — E669 Obesity, unspecified: Secondary | ICD-10-CM | POA: Diagnosis not present

## 2012-04-28 DIAGNOSIS — L439 Lichen planus, unspecified: Secondary | ICD-10-CM | POA: Diagnosis not present

## 2012-04-28 DIAGNOSIS — M25569 Pain in unspecified knee: Secondary | ICD-10-CM | POA: Diagnosis not present

## 2012-04-28 NOTE — Telephone Encounter (Signed)
OK to fill this prescription with additional refills x0 Needs OV Thank you!  

## 2012-04-29 ENCOUNTER — Other Ambulatory Visit: Payer: Self-pay | Admitting: *Deleted

## 2012-04-29 MED ORDER — OXYCODONE-ACETAMINOPHEN 10-325 MG PO TABS: 1.0000 | ORAL_TABLET | Freq: Four times a day (QID) | ORAL | Status: AC | PRN

## 2012-05-11 ENCOUNTER — Encounter: Payer: Self-pay | Admitting: Pulmonary Disease

## 2012-05-11 ENCOUNTER — Ambulatory Visit (INDEPENDENT_AMBULATORY_CARE_PROVIDER_SITE_OTHER): Payer: Medicare Other | Admitting: Pulmonary Disease

## 2012-05-11 VITALS — BP 100/72 | HR 82 | Temp 97.0°F | Ht 67.0 in | Wt 333.0 lb

## 2012-05-11 DIAGNOSIS — D869 Sarcoidosis, unspecified: Secondary | ICD-10-CM | POA: Diagnosis not present

## 2012-05-11 NOTE — Assessment & Plan Note (Addendum)
The patient has a history of sarcoidosis that is long-standing, and currently is being managed by rheumatology.  She is now having increasing shortness of breath, but it is unclear if this has anything to do with her sarcoid.  She is morbidly obese, and has gained 44 pounds since her last visit with me in 2009.  She also has significant lower extremity edema which is probably contributing to her symptomatology.  I would like to do a followup CT chest as well as pulmonary function studies, to get some idea how much her lungs may be involved in all of this.  I have also asked her to make sure she gets yearly ophthalmology evaluation to make sure she doesn't have ocular sarcoid.

## 2012-05-11 NOTE — Patient Instructions (Addendum)
Will schedule you for a scan of your chest to see how much of an issue sarcoid may be. Will schedule for breathing studies. Work on weight loss.  Stop advair for now.   Will arrange followup with me once results are available.

## 2012-05-11 NOTE — Progress Notes (Signed)
  Subjective:    Patient ID: [name], female    DOB: November 03, 1960, 52 y.o.   MRN: [id number]  HPI The patient is a 52 year old female who I've been asked to see for worsening dyspnea on exertion.  She has a diagnosis of sarcoid established in 2004 by skin biopsy, and also has a history of lymphadenopathy with peri-bronchovascular nodules in the upper lobes on prior workup.  She was treated with prednisone, but had poor tolerance at the higher doses.  She was then treated with low-dose prednisone and Plaquenil, in order to treat her skin findings as well.  She was unable to afford Plaquenil, and ultimately was lost to followup.  She comes in today where she has had worsening shortness of breath most recently, and has been seen rheumatology who has been treating her with prednisone.  She has not been having a lot of active skin findings.  The patient has not had ophthalmology followup as well.  She describes a less than one block dyspnea on exertion at a moderate pace, and can get winded just walking through the house.  She denies any cough or mucus production, but has had worsening lower extremity edema for which she is on Lasix.  She has been tried on Advair for the last few months to see if that will help her breathing, and it has not.  Of note, the patient's weight is up 44 pounds since her last visit with me 5 years ago.   Review of Systems  Constitutional: Positive for unexpected weight change. Negative for fever.  HENT: Positive for rhinorrhea and dental problem. Negative for ear pain, nosebleeds, congestion, sore throat, sneezing, trouble swallowing, postnasal drip and sinus pressure.   Eyes: Negative for redness and itching.  Respiratory: Positive for shortness of breath and wheezing. Negative for cough and chest tightness.   Cardiovascular: Positive for leg swelling. Negative for palpitations.  Gastrointestinal: Negative for nausea and vomiting.  Genitourinary: Negative for dysuria.   Musculoskeletal: Negative for joint swelling.  Skin: Negative for rash.  Neurological: Negative for headaches.  Hematological: Does not bruise/bleed easily.  Psychiatric/Behavioral: Positive for dysphoric mood. The patient is nervous/anxious.        Objective:   Physical Exam Constitutional:  Morbidly obese female, no acute distress  HENT:  Nares patent without discharge  Oropharynx without exudate, palate and uvula are thick and elongated.   Eyes:  Perrla, eomi, no scleral icterus  Neck:  No JVD, no TMG  Cardiovascular:  Normal rate, regular rhythm, no rubs or gallops.  No murmurs        Intact distal pulses  Pulmonary :  Normal breath sounds, no stridor or respiratory distress   No rales, rhonchi, or wheezing  Abdominal:  Soft, nondistended, bowel sounds present.  No tenderness noted.   Musculoskeletal:  2+ lower extremity edema noted.  Lymph Nodes:  No cervical lymphadenopathy noted  Skin:  No cyanosis noted  Neurologic:  Alert, appropriate, moves all 4 extremities without obvious deficit.         Assessment & Plan:

## 2012-05-13 ENCOUNTER — Other Ambulatory Visit: Payer: Self-pay | Admitting: *Deleted

## 2012-05-13 DIAGNOSIS — M1710 Unilateral primary osteoarthritis, unspecified knee: Secondary | ICD-10-CM | POA: Diagnosis not present

## 2012-05-13 MED ORDER — POTASSIUM CHLORIDE CRYS ER 10 MEQ PO TBCR: 10.0000 meq | EXTENDED_RELEASE_TABLET | Freq: Every day | ORAL | Status: DC

## 2012-05-13 MED ORDER — FUROSEMIDE 40 MG PO TABS: 40.0000 mg | ORAL_TABLET | Freq: Every day | ORAL | Status: DC | PRN

## 2012-05-13 MED ORDER — CARVEDILOL 12.5 MG PO TABS: 12.5000 mg | ORAL_TABLET | Freq: Two times a day (BID) | ORAL | Status: DC

## 2012-05-19 ENCOUNTER — Ambulatory Visit (INDEPENDENT_AMBULATORY_CARE_PROVIDER_SITE_OTHER)
Admission: RE | Admit: 2012-05-19 | Discharge: 2012-05-19 | Disposition: A | Discharge status: Home / Self Care | Payer: Medicare Other | Source: Ambulatory Visit | Attending: Pulmonary Disease | Admitting: Pulmonary Disease

## 2012-05-19 DIAGNOSIS — D869 Sarcoidosis, unspecified: Secondary | ICD-10-CM

## 2012-05-25 ENCOUNTER — Ambulatory Visit: Payer: Medicare Other | Admitting: Neurology

## 2012-05-27 DIAGNOSIS — L439 Lichen planus, unspecified: Secondary | ICD-10-CM | POA: Diagnosis not present

## 2012-05-27 DIAGNOSIS — D869 Sarcoidosis, unspecified: Secondary | ICD-10-CM | POA: Diagnosis not present

## 2012-05-27 DIAGNOSIS — M25569 Pain in unspecified knee: Secondary | ICD-10-CM | POA: Diagnosis not present

## 2012-05-28 ENCOUNTER — Encounter: Payer: Self-pay | Admitting: Internal Medicine

## 2012-05-28 ENCOUNTER — Ambulatory Visit (INDEPENDENT_AMBULATORY_CARE_PROVIDER_SITE_OTHER): Payer: Medicare Other | Admitting: Internal Medicine

## 2012-05-28 VITALS — BP 140/82 | HR 80 | Temp 97.3°F | Resp 16 | Wt 335.0 lb

## 2012-05-28 DIAGNOSIS — E1165 Type 2 diabetes mellitus with hyperglycemia: Secondary | ICD-10-CM

## 2012-05-28 DIAGNOSIS — G4700 Insomnia, unspecified: Secondary | ICD-10-CM

## 2012-05-28 DIAGNOSIS — D869 Sarcoidosis, unspecified: Secondary | ICD-10-CM | POA: Diagnosis not present

## 2012-05-28 DIAGNOSIS — I2510 Atherosclerotic heart disease of native coronary artery without angina pectoris: Secondary | ICD-10-CM | POA: Diagnosis not present

## 2012-05-28 DIAGNOSIS — E559 Vitamin D deficiency, unspecified: Secondary | ICD-10-CM

## 2012-05-28 DIAGNOSIS — E538 Deficiency of other specified B group vitamins: Secondary | ICD-10-CM

## 2012-05-28 DIAGNOSIS — E669 Obesity, unspecified: Secondary | ICD-10-CM

## 2012-05-28 MED ORDER — OXYCODONE-ACETAMINOPHEN 10-325 MG PO TABS: 1.0000 | ORAL_TABLET | Freq: Four times a day (QID) | ORAL | Status: DC | PRN

## 2012-05-28 MED ORDER — OXYCODONE-ACETAMINOPHEN 10-325 MG PO TABS: 1.0000 | ORAL_TABLET | Freq: Four times a day (QID) | ORAL | Status: AC | PRN

## 2012-05-28 MED ORDER — ASPIRIN 81 MG PO CHEW: 81.0000 mg | CHEWABLE_TABLET | Freq: Every day | ORAL | Status: DC

## 2012-05-28 NOTE — Assessment & Plan Note (Signed)
Continue with current prescription therapy as reflected on the Med list.  

## 2012-05-28 NOTE — Assessment & Plan Note (Addendum)
CT 5/14: IMPRESSION:   3. Atherosclerosis, including left anterior descending coronary  artery disease. Please note that although the presence of coronary  artery calcium documents the presence of coronary artery disease,  the severity of this disease and any potential stenosis cannot be  assessed on this non-gated CT examination. Assessment for  potential risk factor modification, dietary therapy or  pharmacologic therapy may be warranted, if clinically indicated.  Original Report Authenticated By: Trudie Reed, M.D. Start ASA No sx's

## 2012-05-28 NOTE — Assessment & Plan Note (Addendum)
Continue with current prescription therapy as reflected on the Med list. Re-start Rx

## 2012-05-28 NOTE — Assessment & Plan Note (Signed)
Continue with current prescription prn therapy as reflected on the Med list.  

## 2012-05-28 NOTE — Progress Notes (Signed)
   Subjective:    HPI   F/u B knee pain - seeing Ortho C/o wt gain  The patient presents for a follow-up of  chronic hypertension, chronic dyslipidemia, type 2 diabetes controlled with medicines F/u rash on face F/u sarcoid flare up (now on Predn 5 mg a day)      Wt Readings from Last 3 Encounters:  05/28/12 335 lb (151.955 kg)  05/11/12 333 lb (151.048 kg)  03/03/12 319 lb (144.697 kg)   BP Readings from Last 3 Encounters:  05/28/12 140/82  05/11/12 100/72  03/03/12 126/80      Review of Systems  Constitutional: Positive for unexpected weight change. Negative for activity change and appetite change.  HENT: Negative for mouth sores and sinus pressure.   Eyes: Negative for visual disturbance.  Respiratory: Negative for chest tightness.   Genitourinary: Negative for difficulty urinating and vaginal pain.  Musculoskeletal: Negative for back pain and gait problem.  Skin: Negative for pallor.  Neurological: Negative for dizziness, tremors and weakness.  Psychiatric/Behavioral: Positive for sleep disturbance. Negative for suicidal ideas, hallucinations, confusion and agitation. The patient is nervous/anxious.         Objective:   Physical Exam  Constitutional: She appears well-developed. No distress.  Obese   HENT:  Head: Normocephalic.  Right Ear: External ear normal.  Left Ear: External ear normal.  Nose: Nose normal.  Mouth/Throat: Oropharynx is clear and moist. No oropharyngeal exudate.  Caries; missing front teeth  Eyes: Conjunctivae are normal. Pupils are equal, round, and reactive to light. Right eye exhibits no discharge. Left eye exhibits no discharge.  Neck: Normal range of motion. Neck supple. No JVD present. No tracheal deviation present. No thyromegaly present.  Cardiovascular: Normal rate, regular rhythm and normal heart sounds.   Pulmonary/Chest: No stridor. No respiratory distress. She has no wheezes.  Abdominal: Soft. Bowel sounds are normal. She  exhibits no distension and no mass. There is no tenderness. There is no rebound and no guarding.  Musculoskeletal: She exhibits no edema and no tenderness.  Lymphadenopathy:    She has no cervical adenopathy.  Neurological: She displays normal reflexes. No cranial nerve deficit. She exhibits normal muscle tone. Coordination normal.  Skin: No rash noted. No erythema.  Psychiatric: She has a normal mood and affect. Her behavior is normal. Judgment and thought content normal.  SD on face Sarcoid lesions on L thigh, L arm, face  R knee is tender posterior     Lab Results  Component Value Date   WBC 7.2 05/09/2011   HGB 15.6* 05/09/2011   HCT 45.8 05/09/2011   PLT 211.0 05/09/2011   CHOL 193 06/29/2008   TRIG 145.0 06/29/2008   HDL 67.50 06/29/2008   ALT 50* 11/26/2011   AST 33 11/26/2011   NA 135 11/26/2011   K 4.4 11/26/2011   CL 100 11/26/2011   CREATININE 0.9 11/26/2011   BUN 14 11/26/2011   CO2 26 11/26/2011   TSH 1.47 11/26/2011   INR 1.01 10/01/2010   HGBA1C 6.1 03/03/2012   MICROALBUR 0.5 03/03/2012     Assessment & Plan:

## 2012-05-28 NOTE — Assessment & Plan Note (Signed)
Diet discussed Labs 

## 2012-05-28 NOTE — Assessment & Plan Note (Signed)
Diagnosed by skin biopsy CT 2008:  Calcified mediastinal LN, peribronchovascular nodules primarily in upper lobes. Tried on plaquenil in the past due to prednisone intolerance at higher doses. Currently seeing derm and rheum ([place]) for management of sarcoid. On chronic prednisone at 5mg /day.  PFT's 2008 (poor effort):  FEV1 1.63 (60%), ratio 69, TLC 3.52 (68%), DLCO 62%.   CT 5/14: IMPRESSION:  1. Evolving changes of sarcoidosis in the lungs and mediastinum,  as above.  2. New nodular opacity in the left apex and mass-like opacity in  the right apex are both strongly favored to be related to the  patient's underlying sarcoidosis. Neoplasm is less likely, but is  difficult to entirely exclude on the basis of today's examination.  If clinically indicated, attention on a short term follow-up  noncontrast chest CT in 3 months would be useful to ensure the  stability of both of these lesions, as they are new compared to  prior study from 2008.  3. Atherosclerosis, including left anterior descending coronary  artery disease. Please note that although the presence of coronary  artery calcium documents the presence of coronary artery disease,  the severity of this disease and any potential stenosis cannot be  assessed on this non-gated CT examination. Assessment for  potential risk factor modification, dietary therapy or  pharmacologic therapy may be warranted, if clinically indicated.  Original Report Authenticated By: Trudie Reed, M.D.

## 2012-05-30 ENCOUNTER — Other Ambulatory Visit: Payer: Self-pay | Admitting: Internal Medicine

## 2012-06-02 ENCOUNTER — Encounter: Payer: Self-pay | Admitting: Endocrinology

## 2012-06-02 ENCOUNTER — Ambulatory Visit (INDEPENDENT_AMBULATORY_CARE_PROVIDER_SITE_OTHER): Payer: Medicare Other | Admitting: Endocrinology

## 2012-06-02 VITALS — BP 124/74 | HR 90 | Ht 67.0 in | Wt 337.0 lb

## 2012-06-02 DIAGNOSIS — E1165 Type 2 diabetes mellitus with hyperglycemia: Secondary | ICD-10-CM

## 2012-06-02 NOTE — Patient Instructions (Addendum)
check your blood sugar once a day.  vary the time of day when you check, between before the 3 meals, and at bedtime.  also check if you have symptoms of your blood sugar being too high or too low.  please keep a record of the readings and bring it to your next appointment here.  please call us sooner if your blood sugar goes below 70, or if it stays over 200.  Please come back for a follow-up appointment in 3 months.  blood tests are being requested for you today.  We'll contact you with results. pending the test results, please continue the same insulins for now.

## 2012-06-02 NOTE — Progress Notes (Signed)
Subjective:    Patient ID: [name], female    DOB: 01-Dec-1960, 52 y.o.   MRN: [id number]  HPI Pt reutrns for f/u of IDDM (dx'ed 2013--she has mild if any neuropathy of the lower extremities; no other known associated complications; therapy has been limited by pt's need for a simple regimen).  no cbg record, but states cbg's are well-controlled.  it is highest in the afternoon, and lowest in am.  Her prednisone has been decreased to 5 mg tiw.   Past Medical History  Diagnosis Date  . Abdominal pain     rlq  . Urinary tract infection, site not specified   . Sinusitis   . Osteoarthritis   . Sarcoidosis     Dr. Wilford Grist  . Esophageal reflux   . Depression   . Anxiety   . Vitamin D deficiency 2009  . Vitamin B 12 deficiency 2009  . Ruptured appendix   . Hypertension   . OSA (obstructive sleep apnea)   . Allergic rhinitis   . ABDOMINAL PAIN RIGHT LOWER QUADRANT 02/07/2007  . ALLERGIC RHINITIS 10/26/2009  . ANXIETY 08/14/2006  . B12 DEFICIENCY 08/25/2007  . DEPRESSION 08/14/2006  . DYSPNEA 04/28/2009  . ELEVATED BP 07/10/2007  . GRIEF REACTION 08/01/2009  . HYPERGLYCEMIA 12/30/2008  . HYPERTENSION 08/27/2007  . INSOMNIA, CHRONIC 03/07/2010  . NAUSEA 03/07/2010  . OBESITY 03/07/2010  . VISUAL CHANGES 07/02/2007  . VITAMIN D DEFICIENCY 08/25/2007  . Shortness of breath   . Family history of breast cancer     sister    Past Surgical History  Procedure Laterality Date  . Arthroscopic knee surgery  11-12-04    L  . Appendectomy    . Endometrial ablation    . Axillary abcess irrigation and debridement  Jul & Aug2012    History   Social History  . Marital Status: Single    Spouse Name: N/A    Number of Children: N/A  . Years of Education: N/A   Occupational History  . disabled    Social History Main Topics  . Smoking status: Former Smoker -- 0.50 packs/day for 10 years    Types: Cigarettes    Quit date: 01/14/2002  . Smokeless tobacco: Not on file  . Alcohol  Use: No  . Drug Use: No  . Sexually Active: Not Currently   Other Topics Concern  . Not on file   Social History Narrative   Single, broke up with partner in 2008.    Current Outpatient Prescriptions on File Prior to Visit  Medication Sig Dispense Refill  . aspirin (ASPIRIN CHILDRENS) 81 MG chewable tablet Chew 1 tablet (81 mg total) by mouth daily.  100 tablet  3  . busPIRone (BUSPAR) 10 MG tablet Take 1 tablet (10 mg total) by mouth 2 (two) times daily.  180 tablet  1  . carvedilol (COREG) 12.5 MG tablet Take 1 tablet (12.5 mg total) by mouth 2 (two) times daily with a meal.  180 tablet  1  . citalopram (CELEXA) 40 MG tablet TAKE 1 &1/2 TABLETS BY MOUTH DAILY  135 tablet  1  . clotrimazole-betamethasone (LOTRISONE) cream Apply topically 2 (two) times daily.  30 g  0  . esomeprazole (NEXIUM) 40 MG capsule Take 1 capsule (40 mg total) by mouth daily before breakfast.  90 capsule  1  . Fluticasone-Salmeterol (ADVAIR) 100-50 MCG/DOSE AEPB Inhale 1 puff into the lungs 2 (two) times daily.  1 each  3  . FREESTYLE LITE  test strip TEST TWICE DAILY  100 each  3  . furosemide (LASIX) 40 MG tablet Take 1 tablet (40 mg total) by mouth daily as needed (swelling).  30 tablet  5  . insulin aspart (NOVOLOG FLEXPEN) 100 UNIT/ML injection Inject 10 Units into the skin daily. With the evening meal      . insulin glargine (LANTUS) 100 UNIT/ML injection Inject 30 Units into the skin every morning.       [name] ketoconazole (NIZORAL) 2 % cream APPLY TOPICALLY DAILY  45 g  1  . Lancets (FREESTYLE) lancets 1 each by Other route 2 (two) times daily. Use as instructed  100 each  12  . metoCLOPramide (REGLAN) 10 MG tablet Take 1 tablet (10 mg total) by mouth 3 (three) times daily.  270 tablet  1  . nortriptyline (PAMELOR) 25 MG capsule TAKE 1 TO 2 CAPSULES BY MOUTH AT BEDTIME FOR INSOMNIA  180 capsule  1  . Omega-3 Fatty Acids (FISH OIL) 1000 MG CAPS Take by mouth daily.        [name] oxyCODONE-acetaminophen (PERCOCET)  10-325 MG per tablet Take 1 tablet by mouth every 6 (six) hours as needed for pain. Please fill on or after 06/30/12  120 tablet  0  . potassium chloride (K-DUR,KLOR-CON) 10 MEQ tablet Take 1 tablet (10 mEq total) by mouth daily.  90 tablet  1  . prednisoLONE 5 MG TABS Take 5 mg by mouth.       No current facility-administered medications on file prior to visit.    Allergies  Allergen Reactions  . Enalapril Maleate     REACTION: cough  . Hydrochlorothiazide     REACTION: Low potassium  . Hydroxychloroquine Sulfate     REACTION: vision changes  . Iodine     Mother was intolerant--CODED on CT table---pt never tired.  . Latex     Hives    Family History  Problem Relation Age of Onset  . Hypertension    . Coronary artery disease      female 1st degree relative <60  . Heart failure      congestive  . Coronary artery disease      Female 1st degree relative <50  . Breast cancer      1st degree relative <50 S  . Heart disease Mother   . Kidney disease Mother   . Cancer Father     leukemia    BP 124/74  Pulse 90  Ht 5\' 7"  (1.702 m)  Wt 337 lb (152.862 kg)  BMI 52.77 kg/m2  SpO2 93%   Review of Systems denies hypoglycemia, but she has weight gain.    Objective:   Physical Exam VITAL SIGNS:  See vs page GENERAL: no distress   Lab Results  Component Value Date   HGBA1C 6.7* 06/02/2012      Assessment & Plan:  DM: overcontrolled.  Benefit of improved glycemic control should be weighed against risk of hypoglycemia.

## 2012-06-03 ENCOUNTER — Telehealth: Payer: Self-pay | Admitting: *Deleted

## 2012-06-03 NOTE — Telephone Encounter (Signed)
Rf req for Nortriptyline 25 mg 1-2 po qhs for insomnia. # 180. Last filled 04/21/12. Ok to Rf?

## 2012-06-03 NOTE — Telephone Encounter (Signed)
OK to fill this prescription with additional refills x5 Thank you!  

## 2012-06-04 MED ORDER — NORTRIPTYLINE HCL 25 MG PO CAPS: ORAL_CAPSULE | ORAL | Status: DC

## 2012-06-04 NOTE — Telephone Encounter (Signed)
Done

## 2012-06-17 ENCOUNTER — Telehealth: Payer: Self-pay | Admitting: *Deleted

## 2012-06-17 NOTE — Telephone Encounter (Signed)
Rec fax stating pt is taking Citalopram 40 mg 1 po qd. Our records indicate 1.5 tab qd. Please advise so a new Rx can be sent for 90 day supply.

## 2012-06-18 ENCOUNTER — Telehealth: Payer: Self-pay | Admitting: *Deleted

## 2012-06-18 MED ORDER — CITALOPRAM HYDROBROMIDE 40 MG PO TABS: 40.0000 mg | ORAL_TABLET | Freq: Every day | ORAL | Status: DC

## 2012-06-18 NOTE — Telephone Encounter (Signed)
Pls ask the pt. Thx 

## 2012-06-18 NOTE — Telephone Encounter (Signed)
Rec fax stating pt is taking Citalopram 40 mg 1 po qd. Our records indicate 1.5 tab qd. Please advise so a new Rx can be sent for 90 day supply.  

## 2012-06-18 NOTE — Telephone Encounter (Signed)
Left msg on triage stating been trying to get citalopram filled she is currently out and need med today. Called pt back inform her pharmacy have fax md need to verify if she take 1 or 1 1/2. Pt states she is only taking 1 pill a day now. Inform pt will send to her pharmacy...lmb

## 2012-06-19 DIAGNOSIS — M1710 Unilateral primary osteoarthritis, unspecified knee: Secondary | ICD-10-CM | POA: Diagnosis not present

## 2012-06-25 ENCOUNTER — Ambulatory Visit (INDEPENDENT_AMBULATORY_CARE_PROVIDER_SITE_OTHER): Payer: Medicare Other | Admitting: Pulmonary Disease

## 2012-06-25 ENCOUNTER — Other Ambulatory Visit (INDEPENDENT_AMBULATORY_CARE_PROVIDER_SITE_OTHER): Payer: Medicare Other

## 2012-06-25 DIAGNOSIS — D869 Sarcoidosis, unspecified: Secondary | ICD-10-CM

## 2012-06-25 DIAGNOSIS — E538 Deficiency of other specified B group vitamins: Secondary | ICD-10-CM

## 2012-06-25 DIAGNOSIS — IMO0001 Reserved for inherently not codable concepts without codable children: Secondary | ICD-10-CM

## 2012-06-25 DIAGNOSIS — R0602 Shortness of breath: Secondary | ICD-10-CM | POA: Diagnosis not present

## 2012-06-25 DIAGNOSIS — I2510 Atherosclerotic heart disease of native coronary artery without angina pectoris: Secondary | ICD-10-CM | POA: Diagnosis not present

## 2012-06-25 DIAGNOSIS — E1165 Type 2 diabetes mellitus with hyperglycemia: Secondary | ICD-10-CM

## 2012-06-25 LAB — BASIC METABOLIC PANEL
BUN: 13 mg/dL (ref 6–23)
Calcium: 9.1 mg/dL (ref 8.4–10.5)
GFR: 65.68 mL/min (ref >60.00)
Glucose, Bld: 164 mg/dL — ABNORMAL HIGH (ref 70–99)
Potassium: 3.6 mEq/L (ref 3.5–5.1)
Sodium: 136 mEq/L (ref 135–145)

## 2012-06-25 LAB — TSH: TSH: 1.92 u[IU]/mL (ref 0.35–5.50)

## 2012-06-25 LAB — PULMONARY FUNCTION TEST

## 2012-06-25 NOTE — Progress Notes (Signed)
PFT done today. 

## 2012-06-28 ENCOUNTER — Other Ambulatory Visit: Payer: Self-pay | Admitting: Endocrinology

## 2012-06-29 ENCOUNTER — Other Ambulatory Visit: Payer: Self-pay | Admitting: *Deleted

## 2012-06-29 ENCOUNTER — Telehealth: Payer: Self-pay | Admitting: *Deleted

## 2012-06-29 MED ORDER — INSULIN GLARGINE 100 UNIT/ML SOLOSTAR PEN: PEN_INJECTOR | SUBCUTANEOUS | Status: DC

## 2012-06-29 NOTE — Telephone Encounter (Signed)
Pharmacy called to clarify rx sent in for pt's lantus. Called and had to lvm that the rx sig should be, inject 50 units into skin at bedtime. Disp, 15 mL and refills prn.

## 2012-07-12 ENCOUNTER — Other Ambulatory Visit: Payer: Self-pay | Admitting: Internal Medicine

## 2012-07-15 ENCOUNTER — Telehealth: Payer: Self-pay | Admitting: Pulmonary Disease

## 2012-07-15 NOTE — Telephone Encounter (Signed)
Pt needs ov to review her pfts and treatment plan.

## 2012-07-16 DIAGNOSIS — M1710 Unilateral primary osteoarthritis, unspecified knee: Secondary | ICD-10-CM | POA: Diagnosis not present

## 2012-07-16 NOTE — Telephone Encounter (Signed)
Pt scheduled for follow up visit 07/20/12 @ 11:15 with [facility]

## 2012-07-20 ENCOUNTER — Ambulatory Visit (INDEPENDENT_AMBULATORY_CARE_PROVIDER_SITE_OTHER): Payer: Medicare Other | Admitting: Pulmonary Disease

## 2012-07-20 ENCOUNTER — Encounter: Payer: Self-pay | Admitting: Pulmonary Disease

## 2012-07-20 VITALS — BP 118/88 | HR 78 | Temp 97.1°F | Ht 66.0 in | Wt 336.4 lb

## 2012-07-20 DIAGNOSIS — R0602 Shortness of breath: Secondary | ICD-10-CM

## 2012-07-20 DIAGNOSIS — R918 Other nonspecific abnormal finding of lung field: Secondary | ICD-10-CM | POA: Insufficient documentation

## 2012-07-20 DIAGNOSIS — D869 Sarcoidosis, unspecified: Secondary | ICD-10-CM | POA: Diagnosis not present

## 2012-07-20 NOTE — Assessment & Plan Note (Signed)
The patient's recent CT chest shows no significant scarring or interstitial lung disease to explain her level of dyspnea on exertion.  Her PFTs showed worsening restriction from 2008, as expected given her greater than 40 pound weight gain.  Her DLCO is stable, supporting the hypothesis that her sarcoid is really not causing her shortness of breath.  I suspect her main problem is morbid obesity and deconditioning, but she also had calcifications in her coronary arteries that would raise the question of possible coronary artery disease.  I would not suggest any new therapy for her sarcoid from a pulmonary standpoint.  I have encouraged her to work aggressively on weight loss and conditioning, and we'll send a note to her primary physician to consider a possible stress test for completeness.

## 2012-07-20 NOTE — Progress Notes (Signed)
  Subjective:    Patient ID: [name], female    DOB: October 15, 1960, 52 y.o.   MRN: [id number]  HPI Patient comes in today for followup of her recent CT chest and pulmonary function studies.  Her CT showed no evidence for interstitial lung disease or scarring, calcified lymphadenopathy, 2 apical abnormalities that appeared to be a conglomeration of nodules, and finally significant coronary artery calcifications.  Her PFTs showed no airflow obstruction, and therefore she clearly does not need Advair.  She did have worsening total lung capacity compared to 2008 I suspect is related to her centripetal obesity, and also a mildly decreased diffusion capacity that was stable compared to 2008.  I have reviewed the studies with her in detail, and answered all of her questions.   Review of Systems  Constitutional: Negative for fever and unexpected weight change.  HENT: Negative for ear pain, nosebleeds, congestion, sore throat, rhinorrhea, sneezing, trouble swallowing, dental problem, postnasal drip and sinus pressure.   Eyes: Negative for redness and itching.  Respiratory: Positive for chest tightness, shortness of breath and wheezing. Negative for cough.   Cardiovascular: Negative for palpitations and leg swelling.  Gastrointestinal: Negative for nausea and vomiting.  Genitourinary: Negative for dysuria.  Musculoskeletal: Negative for joint swelling.  Skin: Negative for rash.  Neurological: Negative for headaches.  Hematological: Does not bruise/bleed easily.  Psychiatric/Behavioral: Negative for dysphoric mood. The patient is not nervous/anxious.        Objective:   Physical Exam Morbidly obese female in no acute distress Nose without purulence or discharge noted Neck without lymphadenopathy or thyromegaly Chest clear to auscultation Cardiac exam with regular rate and rhythm Lower extremities with edema noted, no cyanosis Alert and oriented, moves all 4 extremities.        Assessment & Plan:

## 2012-07-20 NOTE — Assessment & Plan Note (Signed)
The patient will need a followup CT chest in 6 months to look at the nodules in question.  I suspect these are inflammatory, and related to her sarcoid.  However, the patient had no effect on her breathing.

## 2012-07-20 NOTE — Patient Instructions (Addendum)
I suspect your shortness of breath is more from your weight and conditioning than your sarcoid. Will send a note to your primary md to decide if you need any type of cardiac evaluation. Would like to see you back in 6mos to check on things.  Will schedule for a followup ct chest at that time to relook at your lung "spots".

## 2012-07-22 ENCOUNTER — Telehealth: Payer: Self-pay

## 2012-07-22 DIAGNOSIS — E1165 Type 2 diabetes mellitus with hyperglycemia: Secondary | ICD-10-CM

## 2012-07-22 NOTE — Telephone Encounter (Signed)
Pt left voicemail stating she needs a referral so that Medicaid will pay for her eyeglasses [phone]

## 2012-07-22 NOTE — Telephone Encounter (Signed)
done

## 2012-07-23 NOTE — Telephone Encounter (Signed)
Pt advised and states an understandiing of results

## 2012-07-28 ENCOUNTER — Encounter: Payer: Self-pay | Admitting: Internal Medicine

## 2012-07-28 ENCOUNTER — Ambulatory Visit (INDEPENDENT_AMBULATORY_CARE_PROVIDER_SITE_OTHER): Payer: Medicaid Other | Admitting: Internal Medicine

## 2012-07-28 ENCOUNTER — Ambulatory Visit: Payer: Medicare Other | Admitting: Internal Medicine

## 2012-07-28 VITALS — BP 130/78 | HR 80 | Resp 16 | Wt 333.0 lb

## 2012-07-28 DIAGNOSIS — I2510 Atherosclerotic heart disease of native coronary artery without angina pectoris: Secondary | ICD-10-CM

## 2012-07-28 DIAGNOSIS — E538 Deficiency of other specified B group vitamins: Secondary | ICD-10-CM

## 2012-07-28 DIAGNOSIS — I10 Essential (primary) hypertension: Secondary | ICD-10-CM

## 2012-07-28 DIAGNOSIS — D869 Sarcoidosis, unspecified: Secondary | ICD-10-CM

## 2012-07-28 DIAGNOSIS — M25569 Pain in unspecified knee: Secondary | ICD-10-CM | POA: Diagnosis not present

## 2012-07-28 DIAGNOSIS — R918 Other nonspecific abnormal finding of lung field: Secondary | ICD-10-CM

## 2012-07-28 DIAGNOSIS — M25561 Pain in right knee: Secondary | ICD-10-CM

## 2012-07-28 DIAGNOSIS — E559 Vitamin D deficiency, unspecified: Secondary | ICD-10-CM

## 2012-07-28 DIAGNOSIS — F411 Generalized anxiety disorder: Secondary | ICD-10-CM

## 2012-07-28 DIAGNOSIS — E669 Obesity, unspecified: Secondary | ICD-10-CM | POA: Diagnosis not present

## 2012-07-28 DIAGNOSIS — R030 Elevated blood-pressure reading, without diagnosis of hypertension: Secondary | ICD-10-CM

## 2012-07-28 MED ORDER — VITAMIN B-12 1000 MCG SL SUBL: 1.0000 | SUBLINGUAL_TABLET | Freq: Every day | SUBLINGUAL | Status: DC

## 2012-07-28 MED ORDER — OXYCODONE-ACETAMINOPHEN 10-325 MG PO TABS: 1.0000 | ORAL_TABLET | Freq: Four times a day (QID) | ORAL | Status: DC | PRN

## 2012-07-28 MED ORDER — PREDNISONE 10 MG PO TABS: 20.0000 mg | ORAL_TABLET | Freq: Every day | ORAL | Status: DC

## 2012-07-28 NOTE — Assessment & Plan Note (Signed)
Continue with current prescription therapy as reflected on the Med list.  

## 2012-07-28 NOTE — Assessment & Plan Note (Signed)
BP Readings from Last 3 Encounters:  07/28/12 130/78  07/20/12 118/88  06/02/12 124/74

## 2012-07-28 NOTE — Assessment & Plan Note (Signed)
Flare up: on Predn 20 mg/d

## 2012-07-28 NOTE — Assessment & Plan Note (Addendum)
Per Dr Kalman Drape Rx ws refilled

## 2012-07-28 NOTE — Assessment & Plan Note (Signed)
Card cons Dr Eden Emms re: abn coronaries on chest CT and in anticipation of R TKR

## 2012-07-28 NOTE — Progress Notes (Signed)
   Subjective:    HPI   F/u B L>R knee pain - seeing Ortho Dr August Saucer Pt is planning a TKR R in 8/14 F/u wt gain  The patient presents for a follow-up of  chronic hypertension, chronic dyslipidemia, type 2 diabetes controlled with medicines; possible CAD F/u rash on face F/u sarcoid flare up (now on Predn 20 mg a day)      Wt Readings from Last 3 Encounters:  07/28/12 333 lb (151.048 kg)  07/20/12 336 lb 6.4 oz (152.59 kg)  06/02/12 337 lb (152.862 kg)   BP Readings from Last 3 Encounters:  07/28/12 130/78  07/20/12 118/88  06/02/12 124/74      Review of Systems  Constitutional: Positive for unexpected weight change. Negative for activity change and appetite change.  HENT: Negative for mouth sores and sinus pressure.   Eyes: Negative for visual disturbance.  Respiratory: Negative for chest tightness.   Genitourinary: Negative for difficulty urinating and vaginal pain.  Musculoskeletal: Negative for back pain and gait problem.  Skin: Negative for pallor.  Neurological: Negative for dizziness, tremors and weakness.  Psychiatric/Behavioral: Positive for sleep disturbance. Negative for suicidal ideas, hallucinations, confusion and agitation. The patient is nervous/anxious.         Objective:   Physical Exam  Constitutional: She appears well-developed. No distress.  Obese   HENT:  Head: Normocephalic.  Right Ear: External ear normal.  Left Ear: External ear normal.  Nose: Nose normal.  Mouth/Throat: Oropharynx is clear and moist. No oropharyngeal exudate.  Caries; missing front teeth  Eyes: Conjunctivae are normal. Pupils are equal, round, and reactive to light. Right eye exhibits no discharge. Left eye exhibits no discharge.  Neck: Normal range of motion. Neck supple. No JVD present. No tracheal deviation present. No thyromegaly present.  Cardiovascular: Normal rate, regular rhythm and normal heart sounds.   Pulmonary/Chest: No stridor. No respiratory distress.  She has no wheezes.  Abdominal: Soft. Bowel sounds are normal. She exhibits no distension and no mass. There is no tenderness. There is no rebound and no guarding.  Musculoskeletal: She exhibits no edema and no tenderness.  Lymphadenopathy:    She has no cervical adenopathy.  Neurological: She displays normal reflexes. No cranial nerve deficit. She exhibits normal muscle tone. Coordination normal.  Skin: No rash noted. No erythema.  Psychiatric: She has a normal mood and affect. Her behavior is normal. Judgment and thought content normal.  SD on face Sarcoid lesions on L thigh, L arm, face  R knee is tender posteriorly     Lab Results  Component Value Date   WBC 7.2 05/09/2011   HGB 15.6* 05/09/2011   HCT 45.8 05/09/2011   PLT 211.0 05/09/2011   CHOL 193 06/29/2008   TRIG 145.0 06/29/2008   HDL 67.50 06/29/2008   ALT 50* 11/26/2011   AST 33 11/26/2011   NA 136 06/25/2012   K 3.6 06/25/2012   CL 98 06/25/2012   CREATININE 1.0 06/25/2012   BUN 13 06/25/2012   CO2 30 06/25/2012   TSH 1.92 06/25/2012   INR 1.01 10/01/2010   HGBA1C 6.7* 06/02/2012   MICROALBUR 0.5 03/03/2012   Chest CT    Assessment & Plan:    A complex case

## 2012-07-28 NOTE — Assessment & Plan Note (Signed)
R TKR is planned 

## 2012-07-28 NOTE — Assessment & Plan Note (Signed)
Re-start B12 therapy as reflected on the Med list.

## 2012-07-28 NOTE — Assessment & Plan Note (Signed)
Wt Readings from Last 3 Encounters:  07/28/12 333 lb (151.048 kg)  07/20/12 336 lb 6.4 oz (152.59 kg)  06/02/12 337 lb (152.862 kg)

## 2012-08-05 ENCOUNTER — Encounter: Payer: Self-pay | Admitting: Pulmonary Disease

## 2012-08-20 DIAGNOSIS — E1139 Type 2 diabetes mellitus with other diabetic ophthalmic complication: Secondary | ICD-10-CM | POA: Diagnosis not present

## 2012-08-20 DIAGNOSIS — H2510 Age-related nuclear cataract, unspecified eye: Secondary | ICD-10-CM | POA: Diagnosis not present

## 2012-08-25 ENCOUNTER — Telehealth: Payer: Self-pay | Admitting: *Deleted

## 2012-08-25 MED ORDER — OXYCODONE-ACETAMINOPHEN 10-325 MG PO TABS: 1.0000 | ORAL_TABLET | Freq: Four times a day (QID) | ORAL | Status: DC | PRN

## 2012-08-25 NOTE — Telephone Encounter (Signed)
OK to fill this prescription with additional refills x0 Sch OV Thank you!  

## 2012-08-25 NOTE — Telephone Encounter (Signed)
Pt called requesting Oxycodone refill. Last OV 7.15.14. Please advise

## 2012-08-26 NOTE — Telephone Encounter (Signed)
Rx complete.  Pt notified and appoint scheduled.

## 2012-08-31 DIAGNOSIS — Z79899 Other long term (current) drug therapy: Secondary | ICD-10-CM | POA: Diagnosis not present

## 2012-08-31 DIAGNOSIS — D869 Sarcoidosis, unspecified: Secondary | ICD-10-CM | POA: Diagnosis not present

## 2012-08-31 DIAGNOSIS — M25569 Pain in unspecified knee: Secondary | ICD-10-CM | POA: Diagnosis not present

## 2012-09-02 ENCOUNTER — Ambulatory Visit: Payer: Medicare Other | Admitting: Internal Medicine

## 2012-09-07 ENCOUNTER — Encounter: Payer: Self-pay | Admitting: Internal Medicine

## 2012-09-07 ENCOUNTER — Other Ambulatory Visit (INDEPENDENT_AMBULATORY_CARE_PROVIDER_SITE_OTHER): Payer: Medicare Other

## 2012-09-07 ENCOUNTER — Ambulatory Visit (INDEPENDENT_AMBULATORY_CARE_PROVIDER_SITE_OTHER): Payer: Medicare Other | Admitting: Internal Medicine

## 2012-09-07 VITALS — BP 114/80 | HR 80 | Temp 97.9°F | Resp 16 | Wt 339.0 lb

## 2012-09-07 DIAGNOSIS — I10 Essential (primary) hypertension: Secondary | ICD-10-CM

## 2012-09-07 DIAGNOSIS — E538 Deficiency of other specified B group vitamins: Secondary | ICD-10-CM

## 2012-09-07 DIAGNOSIS — R42 Dizziness and giddiness: Secondary | ICD-10-CM

## 2012-09-07 DIAGNOSIS — E669 Obesity, unspecified: Secondary | ICD-10-CM | POA: Diagnosis not present

## 2012-09-07 LAB — HEPATIC FUNCTION PANEL
ALT: 34 U/L (ref 0–35)
AST: 34 U/L (ref 0–37)
Alkaline Phosphatase: 83 U/L (ref 39–117)
Bilirubin, Direct: 0.1 mg/dL (ref 0.0–0.3)
Total Bilirubin: 0.4 mg/dL (ref 0.3–1.2)

## 2012-09-07 LAB — URINALYSIS
Leukocytes, UA: NEGATIVE
Nitrite: NEGATIVE
Specific Gravity, Urine: 1.015 (ref 1.000–1.030)
Total Protein, Urine: NEGATIVE
pH: 7 (ref 5.0–8.0)

## 2012-09-07 LAB — BASIC METABOLIC PANEL
CO2: 25 mEq/L (ref 19–32)
Chloride: 97 mEq/L (ref 96–112)
Creatinine, Ser: 1 mg/dL (ref 0.4–1.2)
Potassium: 4.2 mEq/L (ref 3.5–5.1)
Sodium: 133 mEq/L — ABNORMAL LOW (ref 135–145)

## 2012-09-07 MED ORDER — MECLIZINE HCL 12.5 MG PO TABS: 12.5000 mg | ORAL_TABLET | Freq: Three times a day (TID) | ORAL | Status: DC | PRN

## 2012-09-07 MED ORDER — OXYCODONE-ACETAMINOPHEN 10-325 MG PO TABS: 1.0000 | ORAL_TABLET | Freq: Four times a day (QID) | ORAL | Status: DC | PRN

## 2012-09-07 NOTE — Progress Notes (Signed)
   Subjective:    HPI  C/o dizzy spells, weakness, blurred vision since yesterday am. CBG was ok F/u B L>R knee pain - seeing Ortho Dr August Saucer Pt was planning a TKR R in 8/14 - put on hold - trying to loose wt... F/u wt gain  The patient presents for a follow-up of  chronic hypertension, chronic dyslipidemia, type 2 diabetes controlled with medicines; possible CAD F/u rash on face F/u sarcoid - Dr Kellie Simmering thought it was inactive - tapering off prednisone      Wt Readings from Last 3 Encounters:  09/07/12 339 lb (153.769 kg)  07/28/12 333 lb (151.048 kg)  07/20/12 336 lb 6.4 oz (152.59 kg)   BP Readings from Last 3 Encounters:  09/07/12 114/80  07/28/12 130/78  07/20/12 118/88      Review of Systems  Constitutional: Positive for unexpected weight change. Negative for activity change and appetite change.  HENT: Negative for mouth sores and sinus pressure.   Eyes: Negative for visual disturbance.  Respiratory: Negative for chest tightness.   Genitourinary: Negative for difficulty urinating and vaginal pain.  Musculoskeletal: Negative for back pain and gait problem.  Skin: Negative for pallor.  Neurological: Negative for dizziness, tremors and weakness.  Psychiatric/Behavioral: Positive for sleep disturbance. Negative for suicidal ideas, hallucinations, confusion and agitation. The patient is nervous/anxious.         Objective:   Physical Exam  Constitutional: She appears well-developed. No distress.  Obese   HENT:  Head: Normocephalic.  Right Ear: External ear normal.  Left Ear: External ear normal.  Nose: Nose normal.  Mouth/Throat: Oropharynx is clear and moist. No oropharyngeal exudate.  Caries; missing front teeth  Eyes: Conjunctivae are normal. Pupils are equal, round, and reactive to light. Right eye exhibits no discharge. Left eye exhibits no discharge.  Neck: Normal range of motion. Neck supple. No JVD present. No tracheal deviation present. No thyromegaly  present.  Cardiovascular: Normal rate, regular rhythm and normal heart sounds.   Pulmonary/Chest: No stridor. No respiratory distress. She has no wheezes.  Abdominal: Soft. Bowel sounds are normal. She exhibits no distension and no mass. There is no tenderness. There is no rebound and no guarding.  Musculoskeletal: She exhibits no edema and no tenderness.  Lymphadenopathy:    She has no cervical adenopathy.  Neurological: She displays normal reflexes. No cranial nerve deficit. She exhibits normal muscle tone. Coordination normal.  Skin: No rash noted. No erythema.  Psychiatric: She has a normal mood and affect. Her behavior is normal. Judgment and thought content normal.  SD on face Sarcoid lesions on L thigh, L arm, face  R knee is tender posteriorly     Lab Results  Component Value Date   WBC 7.2 05/09/2011   HGB 15.6* 05/09/2011   HCT 45.8 05/09/2011   PLT 211.0 05/09/2011   CHOL 193 06/29/2008   TRIG 145.0 06/29/2008   HDL 67.50 06/29/2008   ALT 50* 11/26/2011   AST 33 11/26/2011   NA 136 06/25/2012   K 3.6 06/25/2012   CL 98 06/25/2012   CREATININE 1.0 06/25/2012   BUN 13 06/25/2012   CO2 30 06/25/2012   TSH 1.92 06/25/2012   INR 1.01 10/01/2010   HGBA1C 6.7* 06/02/2012   MICROALBUR 0.5 03/03/2012   Chest CT    Assessment & Plan:    A complex case

## 2012-09-07 NOTE — Patient Instructions (Signed)
To ER if worse  Benign Positional Vertigo  Start Meclizine. Start Francee Piccolo - Daroff exercise several times a day as dirrected.

## 2012-09-07 NOTE — Assessment & Plan Note (Signed)
Continue with current prescription therapy as reflected on the Med list.  

## 2012-09-07 NOTE — Assessment & Plan Note (Signed)
Worse Wt Readings from Last 3 Encounters:  09/07/12 339 lb (153.769 kg)  07/28/12 333 lb (151.048 kg)  07/20/12 336 lb 6.4 oz (152.59 kg)

## 2012-09-07 NOTE — Assessment & Plan Note (Addendum)
Benign Positional Vertigo symptoms L Start Meclizine. Start Francee Piccolo - Daroff exercise several times a day as dirrected.

## 2012-09-08 ENCOUNTER — Telehealth: Payer: Self-pay | Admitting: *Deleted

## 2012-09-08 NOTE — Telephone Encounter (Signed)
Pt called requesting medication for Vertigo.  Further states the Meclizine is not covered by her insurance.  please advise

## 2012-09-09 ENCOUNTER — Telehealth: Payer: Self-pay | Admitting: *Deleted

## 2012-09-09 NOTE — Telephone Encounter (Signed)
She can try OTC generic Benadryl 25 mg po qid prn vertigo Thx

## 2012-09-09 NOTE — Telephone Encounter (Signed)
Spoke with pt, states she was able to get the Meclizine.

## 2012-09-09 NOTE — Telephone Encounter (Signed)
Pt called states she is having nasal congestion and thick green mucus drainage.  Requesting an antibiotic.  Please advise

## 2012-09-10 MED ORDER — CEFUROXIME AXETIL 500 MG PO TABS: ORAL_TABLET | ORAL | Status: DC

## 2012-09-10 NOTE — Telephone Encounter (Signed)
Ok Ceftin Thx 

## 2012-09-10 NOTE — Telephone Encounter (Signed)
Spoke with pt advised Rx sent 

## 2012-09-17 ENCOUNTER — Ambulatory Visit (INDEPENDENT_AMBULATORY_CARE_PROVIDER_SITE_OTHER): Payer: Medicare Other | Admitting: Endocrinology

## 2012-09-17 ENCOUNTER — Encounter: Payer: Self-pay | Admitting: Endocrinology

## 2012-09-17 VITALS — BP 130/80 | HR 100 | Ht 67.0 in | Wt 335.0 lb

## 2012-09-17 DIAGNOSIS — E1165 Type 2 diabetes mellitus with hyperglycemia: Secondary | ICD-10-CM

## 2012-09-17 NOTE — Progress Notes (Signed)
Subjective:    Patient ID: [name], female    DOB: 04-28-1960, 52 y.o.   MRN: [id number]  HPI Pt reutrns for f/u of IDDM (dx'ed 2013--she has mild if any neuropathy of the lower extremities; no other known associated complications; therapy has been limited by pt's need for a simple regimen).   Her prednisone has been re-increased to 15 mg qd.  pt states she feels well in general, except for vertigo.  no cbg record, but states cbg's are well-controlled.  There is no trend throughout the day. Past Medical History  Diagnosis Date  . Abdominal pain     rlq  . Urinary tract infection, site not specified   . Sinusitis   . Osteoarthritis   . Sarcoidosis     Dr. Wilford Grist  . Esophageal reflux   . Depression   . Anxiety   . Vitamin D deficiency 2009  . Vitamin B 12 deficiency 2009  . Ruptured appendix   . Hypertension   . OSA (obstructive sleep apnea)   . Allergic rhinitis   . ABDOMINAL PAIN RIGHT LOWER QUADRANT 02/07/2007  . ALLERGIC RHINITIS 10/26/2009  . ANXIETY 08/14/2006  . B12 DEFICIENCY 08/25/2007  . DEPRESSION 08/14/2006  . DYSPNEA 04/28/2009  . ELEVATED BP 07/10/2007  . GRIEF REACTION 08/01/2009  . HYPERGLYCEMIA 12/30/2008  . HYPERTENSION 08/27/2007  . INSOMNIA, CHRONIC 03/07/2010  . NAUSEA 03/07/2010  . OBESITY 03/07/2010  . VISUAL CHANGES 07/02/2007  . VITAMIN D DEFICIENCY 08/25/2007  . Shortness of breath   . Family history of breast cancer     sister    Past Surgical History  Procedure Laterality Date  . Arthroscopic knee surgery  11-12-04    L  . Appendectomy    . Endometrial ablation    . Axillary abcess irrigation and debridement  Jul & Aug2012    History   Social History  . Marital Status: Single    Spouse Name: N/A    Number of Children: N/A  . Years of Education: N/A   Occupational History  . disabled    Social History Main Topics  . Smoking status: Former Smoker -- 0.50 packs/day for 10 years    Types: Cigarettes    Quit date: 01/14/2002   . Smokeless tobacco: Not on file  . Alcohol Use: No  . Drug Use: No  . Sexual Activity: Not Currently   Other Topics Concern  . Not on file   Social History Narrative   Single, broke up with partner in 2008.    Current Outpatient Prescriptions on File Prior to Visit  Medication Sig Dispense Refill  . aspirin (ASPIRIN CHILDRENS) 81 MG chewable tablet Chew 1 tablet (81 mg total) by mouth daily.  100 tablet  3  . busPIRone (BUSPAR) 10 MG tablet TAKE 1 TABLET BY MOUTH TWICE DAILY  180 tablet  1  . carvedilol (COREG) 12.5 MG tablet Take 1 tablet (12.5 mg total) by mouth 2 (two) times daily with a meal.  180 tablet  1  . citalopram (CELEXA) 40 MG tablet Take 1 tablet (40 mg total) by mouth daily.  90 tablet  1  . clotrimazole-betamethasone (LOTRISONE) cream Apply topically 2 (two) times daily.  30 g  0  . Cyanocobalamin (VITAMIN B-12) 1000 MCG SUBL Place 1 tablet (1,000 mcg total) under the tongue daily.  100 tablet  3  . esomeprazole (NEXIUM) 40 MG capsule Take 1 capsule (40 mg total) by mouth daily before breakfast.  90 capsule  1  . Fluticasone-Salmeterol (ADVAIR) 100-50 MCG/DOSE AEPB Inhale 1 puff into the lungs 2 (two) times daily.  1 each  3  . FREESTYLE LITE test strip TEST TWICE DAILY  100 each  3  . furosemide (LASIX) 40 MG tablet Take 1 tablet (40 mg total) by mouth daily as needed (swelling).  30 tablet  5  . insulin aspart (NOVOLOG FLEXPEN) 100 UNIT/ML injection Inject 10 Units into the skin daily with supper.       . Insulin Glargine (LANTUS SOLOSTAR) 100 UNIT/ML SOPN 30 Units daily.       [name] ketoconazole (NIZORAL) 2 % cream APPLY TOPICALLY DAILY  45 g  1  . Lancets (FREESTYLE) lancets 1 each by Other route 2 (two) times daily. Use as instructed  100 each  12  . meclizine (ANTIVERT) 12.5 MG tablet Take 1 tablet (12.5 mg total) by mouth 3 (three) times daily as needed for dizziness or nausea.  60 tablet  1  . metoCLOPramide (REGLAN) 10 MG tablet Take 1 tablet (10 mg total) by  mouth 3 (three) times daily.  270 tablet  1  . nortriptyline (PAMELOR) 25 MG capsule TAKE 1 TO 2 CAPSULES BY MOUTH AT BEDTIME FOR INSOMNIA  180 capsule  5  . Omega-3 Fatty Acids (FISH OIL) 1000 MG CAPS Take by mouth daily.        [name] oxyCODONE-acetaminophen (PERCOCET) 10-325 MG per tablet Take 1 tablet by mouth every 6 (six) hours as needed for pain. Please fill on or after 09/25/12  120 tablet  0  . potassium chloride (K-DUR,KLOR-CON) 10 MEQ tablet Take 1 tablet (10 mEq total) by mouth daily.  90 tablet  1  . prednisoLONE 5 MG TABS Take 5 mg by mouth.      . predniSONE (DELTASONE) 10 MG tablet Take 15 mg by mouth daily.        No current facility-administered medications on file prior to visit.    Allergies  Allergen Reactions  . Enalapril Maleate     REACTION: cough  . Hydrochlorothiazide     REACTION: Low potassium  . Hydroxychloroquine Sulfate     REACTION: vision changes  . Iodine     Mother was intolerant--CODED on CT table---pt never tired.  . Latex     Hives    Family History  Problem Relation Age of Onset  . Hypertension    . Coronary artery disease      female 1st degree relative <60  . Heart failure      congestive  . Coronary artery disease      Female 1st degree relative <50  . Breast cancer      1st degree relative <50 S  . Heart disease Mother   . Kidney disease Mother   . Cancer Father     leukemia   BP 130/80  Pulse 100  Ht 5\' 7"  (1.702 m)  Wt 335 lb (151.955 kg)  BMI 52.46 kg/m2  SpO2 90%  Review of Systems denies hypoglycemia.  She has gained weight.      Objective:   Physical Exam VITAL SIGNS:  See vs page GENERAL: no distress Lab Results  Component Value Date   HGBA1C 7.1* 09/17/2012      Assessment & Plan:  DM: This insulin regimen was chosen from multiple options, for its simplicity.  The benefits of glycemic control must be weighed against the risks of hypoglycemia.  this is the best control this pt should aim for, given  this regimen,  which does match insulin to her changing needs throughout the day Depression: this often complicates the rx of DM, although it seems well-controlled now.

## 2012-09-17 NOTE — Patient Instructions (Addendum)
check your blood sugar once a day.  vary the time of day when you check, between before the 3 meals, and at bedtime.  also check if you have symptoms of your blood sugar being too high or too low.  please keep a record of the readings and bring it to your next appointment here.  please call us sooner if your blood sugar goes below 70, or if it stays over 200.   Please come back for a follow-up appointment in 3 months.  blood tests are being requested for you today.  We'll contact you with results.  pending the test results, please continue the same insulins for now.

## 2012-09-18 ENCOUNTER — Institutional Professional Consult (permissible substitution): Payer: Medicare Other | Admitting: Cardiology

## 2012-09-21 ENCOUNTER — Encounter: Payer: Self-pay | Admitting: Internal Medicine

## 2012-09-21 ENCOUNTER — Ambulatory Visit (INDEPENDENT_AMBULATORY_CARE_PROVIDER_SITE_OTHER): Payer: Medicare Other | Admitting: Internal Medicine

## 2012-09-21 VITALS — BP 138/84 | HR 86 | Temp 97.7°F | Wt 336.0 lb

## 2012-09-21 DIAGNOSIS — Z23 Encounter for immunization: Secondary | ICD-10-CM | POA: Diagnosis not present

## 2012-09-21 DIAGNOSIS — M25569 Pain in unspecified knee: Secondary | ICD-10-CM

## 2012-09-21 DIAGNOSIS — IMO0001 Reserved for inherently not codable concepts without codable children: Secondary | ICD-10-CM

## 2012-09-21 DIAGNOSIS — E1165 Type 2 diabetes mellitus with hyperglycemia: Secondary | ICD-10-CM

## 2012-09-21 DIAGNOSIS — D869 Sarcoidosis, unspecified: Secondary | ICD-10-CM | POA: Diagnosis not present

## 2012-09-21 DIAGNOSIS — R42 Dizziness and giddiness: Secondary | ICD-10-CM

## 2012-09-21 DIAGNOSIS — IMO0002 Reserved for concepts with insufficient information to code with codable children: Secondary | ICD-10-CM

## 2012-09-21 DIAGNOSIS — E669 Obesity, unspecified: Secondary | ICD-10-CM | POA: Diagnosis not present

## 2012-09-21 DIAGNOSIS — E538 Deficiency of other specified B group vitamins: Secondary | ICD-10-CM

## 2012-09-21 MED ORDER — LORCASERIN HCL 10 MG PO TABS: 1.0000 | ORAL_TABLET | Freq: Two times a day (BID) | ORAL | Status: DC

## 2012-09-21 MED ORDER — PREDNISOLONE 5 MG PO TABS: 10.0000 mg | ORAL_TABLET | Freq: Every day | ORAL | Status: DC

## 2012-09-21 MED ORDER — OXYCODONE-ACETAMINOPHEN 10-325 MG PO TABS: 1.0000 | ORAL_TABLET | Freq: Four times a day (QID) | ORAL | Status: DC | PRN

## 2012-09-21 NOTE — Assessment & Plan Note (Signed)
L BPV 8/14 A little better

## 2012-09-21 NOTE — Assessment & Plan Note (Signed)
On 10 mg a day

## 2012-09-21 NOTE — Progress Notes (Signed)
   Subjective:    HPI  F/u dizzy spells, weakness, blurred vision since 2 wks ago. A little better. CBG was ok F/u B L>R knee pain - seeing Ortho Dr August Saucer Pt was planning a TKR R in 8/14 - put on hold - trying to loose wt... F/u wt gain  The patient presents for a follow-up of  chronic hypertension, chronic dyslipidemia, type 2 diabetes controlled with medicines; possible CAD F/u rash on face F/u sarcoid - Dr Kellie Simmering thought it was inactive - tapering off prednisone - now is on 10 mg/d      Wt Readings from Last 3 Encounters:  09/21/12 336 lb (152.409 kg)  09/17/12 335 lb (151.955 kg)  09/07/12 339 lb (153.769 kg)   BP Readings from Last 3 Encounters:  09/21/12 138/84  09/17/12 130/80  09/07/12 114/80      Review of Systems  Constitutional: Positive for unexpected weight change. Negative for activity change and appetite change.  HENT: Negative for mouth sores and sinus pressure.   Eyes: Negative for visual disturbance.  Respiratory: Negative for chest tightness.   Genitourinary: Negative for difficulty urinating and vaginal pain.  Musculoskeletal: Negative for back pain and gait problem.  Skin: Negative for pallor.  Neurological: Negative for dizziness, tremors and weakness.  Psychiatric/Behavioral: Positive for sleep disturbance. Negative for suicidal ideas, hallucinations, confusion and agitation. The patient is nervous/anxious.         Objective:   Physical Exam  Constitutional: She appears well-developed. No distress.  Obese   HENT:  Head: Normocephalic.  Right Ear: External ear normal.  Left Ear: External ear normal.  Nose: Nose normal.  Mouth/Throat: Oropharynx is clear and moist. No oropharyngeal exudate.  Caries; missing front teeth  Eyes: Conjunctivae are normal. Pupils are equal, round, and reactive to light. Right eye exhibits no discharge. Left eye exhibits no discharge.  Neck: Normal range of motion. Neck supple. No JVD present. No tracheal  deviation present. No thyromegaly present.  Cardiovascular: Normal rate, regular rhythm and normal heart sounds.   Pulmonary/Chest: No stridor. No respiratory distress. She has no wheezes.  Abdominal: Soft. Bowel sounds are normal. She exhibits no distension and no mass. There is no tenderness. There is no rebound and no guarding.  Musculoskeletal: She exhibits no edema and no tenderness.  Lymphadenopathy:    She has no cervical adenopathy.  Neurological: She displays normal reflexes. No cranial nerve deficit. She exhibits normal muscle tone. Coordination normal.  Skin: No rash noted. No erythema.  Psychiatric: She has a normal mood and affect. Her behavior is normal. Judgment and thought content normal.  SD on face Sarcoid lesions on L thigh, L arm, face  R knee is tender posteriorly     Lab Results  Component Value Date   WBC 7.2 05/09/2011   HGB 15.6* 05/09/2011   HCT 45.8 05/09/2011   PLT 211.0 05/09/2011   CHOL 193 06/29/2008   TRIG 145.0 06/29/2008   HDL 67.50 06/29/2008   ALT 34 09/07/2012   AST 34 09/07/2012   NA 133* 09/07/2012   K 4.2 09/07/2012   CL 97 09/07/2012   CREATININE 1.0 09/07/2012   BUN 12 09/07/2012   CO2 25 09/07/2012   TSH 1.92 06/25/2012   INR 1.01 10/01/2010   HGBA1C 7.1* 09/17/2012   MICROALBUR 0.5 03/03/2012   Chest CT    Assessment & Plan:

## 2012-09-21 NOTE — Assessment & Plan Note (Signed)
Wt Readings from Last 3 Encounters:  09/21/12 336 lb (152.409 kg)  09/17/12 335 lb (151.955 kg)  09/07/12 339 lb (153.769 kg)

## 2012-09-21 NOTE — Assessment & Plan Note (Signed)
Continue with current prescription therapy as reflected on the Med list.  

## 2012-09-28 ENCOUNTER — Ambulatory Visit (INDEPENDENT_AMBULATORY_CARE_PROVIDER_SITE_OTHER): Payer: Medicare Other | Admitting: Cardiovascular Disease

## 2012-09-28 ENCOUNTER — Encounter: Payer: Self-pay | Admitting: Cardiovascular Disease

## 2012-09-28 VITALS — BP 129/81 | HR 93 | Ht 67.0 in | Wt 337.4 lb

## 2012-09-28 DIAGNOSIS — I2510 Atherosclerotic heart disease of native coronary artery without angina pectoris: Secondary | ICD-10-CM

## 2012-09-28 DIAGNOSIS — D869 Sarcoidosis, unspecified: Secondary | ICD-10-CM

## 2012-09-28 DIAGNOSIS — I10 Essential (primary) hypertension: Secondary | ICD-10-CM | POA: Diagnosis not present

## 2012-09-28 NOTE — Assessment & Plan Note (Signed)
She has atypical pain and multiple CRF;s  ECG no acute changes.  She needs a stress test but cannot walk on a treadmill Will arrange lexiscan myovue.  Likely will have breast artifact but better than any other test and symptoms are atypical and don't warrant cath

## 2012-09-28 NOTE — Assessment & Plan Note (Signed)
Well controlled.  Continue current medications and low sodium Dash type diet.    

## 2012-09-28 NOTE — Progress Notes (Signed)
Patient ID: [name], female   DOB: 03/18/1960, 52 y.o.   MRN: [id number] 52 yo with HTN , type 2 DM.  On chronic steroids obese and cushingoid  Has sarcoid.  Recent CT showed coronary artery calcium  I reviewed the pictures and there is a small amount of calcium in the mid LAD.  She has atypical chest pains that sound muscular.  History of fibromyalgia.  Pain in chest infrequent ? Monthly not related to exertion and feels like a "pulled muscle"  Cannot walk on treadmill due to leg pains.  Sarcoid seems to be controlled  ROS: Denies fever, malais, weight loss, blurry vision, decreased visual acuity, cough, sputum, SOB, hemoptysis, pleuritic pain, palpitaitons, heartburn, abdominal pain, melena, lower extremity edema, claudication, or rash.  All other systems reviewed and negative   General: Affect appropriate Healthy:  appears stated age HEENT: normal Neck supple with no adenopathy JVP normal no bruits no thyromegaly Lungs clear with no wheezing and good diaphragmatic motion Heart:  S1/S2 no murmur,rub, gallop or click PMI normal Abdomen: benighn, BS positve, no tenderness, no AAA no bruit.  No HSM or HJR Distal pulses intact with no bruits No edema Neuro non-focal Skin warm and dry No muscular weakness  Medications Current Outpatient Prescriptions  Medication Sig Dispense Refill  . aspirin (ASPIRIN CHILDRENS) 81 MG chewable tablet Chew 1 tablet (81 mg total) by mouth daily.  100 tablet  3  . busPIRone (BUSPAR) 10 MG tablet TAKE 1 TABLET BY MOUTH TWICE DAILY  180 tablet  1  . carvedilol (COREG) 12.5 MG tablet Take 1 tablet (12.5 mg total) by mouth 2 (two) times daily with a meal.  180 tablet  1  . citalopram (CELEXA) 40 MG tablet Take 1 tablet (40 mg total) by mouth daily.  90 tablet  1  . clotrimazole-betamethasone (LOTRISONE) cream Apply topically 2 (two) times daily.  30 g  0  . Cyanocobalamin (VITAMIN B-12) 1000 MCG SUBL Place 1 tablet (1,000 mcg total) under the tongue  daily.  100 tablet  3  . esomeprazole (NEXIUM) 40 MG capsule Take 1 capsule (40 mg total) by mouth daily before breakfast.  90 capsule  1  . Fluticasone-Salmeterol (ADVAIR) 100-50 MCG/DOSE AEPB Inhale 1 puff into the lungs 2 (two) times daily.  1 each  3  . FREESTYLE LITE test strip TEST TWICE DAILY  100 each  3  . furosemide (LASIX) 40 MG tablet Take 1 tablet (40 mg total) by mouth daily as needed (swelling).  30 tablet  5  . insulin aspart (NOVOLOG FLEXPEN) 100 UNIT/ML injection Inject 10 Units into the skin daily with supper.       . Insulin Glargine (LANTUS SOLOSTAR) 100 UNIT/ML SOPN 30 Units daily.       [name] ketoconazole (NIZORAL) 2 % cream APPLY TOPICALLY DAILY  45 g  1  . Lancets (FREESTYLE) lancets 1 each by Other route 2 (two) times daily. Use as instructed  100 each  12  . Lorcaserin HCl (BELVIQ) 10 MG TABS Take 1 tablet by mouth 2 (two) times daily.  60 tablet  5  . meclizine (ANTIVERT) 12.5 MG tablet Take 1 tablet (12.5 mg total) by mouth 3 (three) times daily as needed for dizziness or nausea.  60 tablet  1  . metoCLOPramide (REGLAN) 10 MG tablet Take 1 tablet (10 mg total) by mouth 3 (three) times daily.  270 tablet  1  . nortriptyline (PAMELOR) 25 MG capsule TAKE 1 TO 2  CAPSULES BY MOUTH AT BEDTIME FOR INSOMNIA  180 capsule  5  . Omega-3 Fatty Acids (FISH OIL) 1000 MG CAPS Take by mouth daily.        [name] oxyCODONE-acetaminophen (PERCOCET) 10-325 MG per tablet Take 1 tablet by mouth every 6 (six) hours as needed for pain. Please fill on or after 10/25/12  120 tablet  0  . potassium chloride (K-DUR,KLOR-CON) 10 MEQ tablet Take 1 tablet (10 mEq total) by mouth daily.  90 tablet  1  . prednisoLONE 5 MG TABS tablet Take 2 tablets (10 mg total) by mouth daily.  90 each  3   No current facility-administered medications for this visit.    Allergies Enalapril maleate; Hydrochlorothiazide; Hydroxychloroquine sulfate; Iodine; and Latex  Family History: Family History  Problem Relation Age  of Onset  . Hypertension    . Coronary artery disease      female 1st degree relative <60  . Heart failure      congestive  . Coronary artery disease      Female 1st degree relative <50  . Breast cancer      1st degree relative <50 S  . Heart disease Mother   . Kidney disease Mother   . Cancer Father     leukemia    Social History: History   Social History  . Marital Status: Single    Spouse Name: N/A    Number of Children: N/A  . Years of Education: N/A   Occupational History  . disabled    Social History Main Topics  . Smoking status: Former Smoker -- 0.50 packs/day for 10 years    Types: Cigarettes    Quit date: 01/14/2002  . Smokeless tobacco: Not on file  . Alcohol Use: No  . Drug Use: No  . Sexual Activity: Not Currently   Other Topics Concern  . Not on file   Social History Narrative   Single, broke up with partner in 2008.    Electrocardiogram:  10/11/10  SR rate 103  Low voltage  Assessment and Plan

## 2012-09-28 NOTE — Patient Instructions (Signed)
Your physician recommends that you schedule a follow-up appointment in:  AS NEEDED  Your physician recommends that you continue on your current medications as directed. Please refer to the Current Medication list given to you today.  Your physician has requested that you have a lexiscan myoview. For further information please visit [email or web address]. Please follow instruction sheet, as given.  

## 2012-09-28 NOTE — Assessment & Plan Note (Signed)
Stable no active wheezing  Recent CT scan with no progression Continue low dose steroids

## 2012-10-09 ENCOUNTER — Other Ambulatory Visit: Payer: Self-pay

## 2012-10-09 NOTE — Telephone Encounter (Signed)
Does she need a cane?  Thx

## 2012-10-09 NOTE — Telephone Encounter (Signed)
Pt lmovm requesting a rx to help with leg pain which is causing walking difficulty

## 2012-10-13 ENCOUNTER — Encounter (INDEPENDENT_AMBULATORY_CARE_PROVIDER_SITE_OTHER): Payer: Self-pay

## 2012-10-13 NOTE — Telephone Encounter (Signed)
Left mess for patient to call back.  

## 2012-10-15 ENCOUNTER — Encounter (HOSPITAL_COMMUNITY): Payer: Medicare Other

## 2012-10-15 MED ORDER — QUAD CANE MISC: Status: DC

## 2012-10-15 NOTE — Telephone Encounter (Signed)
Pt does need cane. Please see pended Rx for cane, print it, sign it an mail to pt per her request.

## 2012-10-15 NOTE — Addendum Note (Signed)
Addended by: Merrilyn Puma on: 10/15/2012 01:31 PM   Modules accepted: Orders

## 2012-10-16 MED ORDER — QUAD CANE MISC: Status: DC

## 2012-10-16 NOTE — Telephone Encounter (Signed)
Reprinted script had md to sign & mail to pt...lmb

## 2012-10-16 NOTE — Addendum Note (Signed)
Addended by: Deatra James on: 10/16/2012 09:31 AM   Modules accepted: Orders

## 2012-10-20 DIAGNOSIS — Z79899 Other long term (current) drug therapy: Secondary | ICD-10-CM | POA: Diagnosis not present

## 2012-10-20 DIAGNOSIS — D869 Sarcoidosis, unspecified: Secondary | ICD-10-CM | POA: Diagnosis not present

## 2012-10-20 DIAGNOSIS — M25519 Pain in unspecified shoulder: Secondary | ICD-10-CM | POA: Diagnosis not present

## 2012-10-20 DIAGNOSIS — M25569 Pain in unspecified knee: Secondary | ICD-10-CM | POA: Diagnosis not present

## 2012-10-23 ENCOUNTER — Other Ambulatory Visit: Payer: Self-pay | Admitting: Internal Medicine

## 2012-10-27 ENCOUNTER — Encounter: Payer: Self-pay | Admitting: Internal Medicine

## 2012-10-27 ENCOUNTER — Ambulatory Visit (INDEPENDENT_AMBULATORY_CARE_PROVIDER_SITE_OTHER): Payer: Medicare Other | Admitting: Internal Medicine

## 2012-10-27 DIAGNOSIS — M25569 Pain in unspecified knee: Secondary | ICD-10-CM

## 2012-10-27 DIAGNOSIS — R21 Rash and other nonspecific skin eruption: Secondary | ICD-10-CM

## 2012-10-27 DIAGNOSIS — E538 Deficiency of other specified B group vitamins: Secondary | ICD-10-CM

## 2012-10-27 DIAGNOSIS — E669 Obesity, unspecified: Secondary | ICD-10-CM | POA: Diagnosis not present

## 2012-10-27 DIAGNOSIS — M25562 Pain in left knee: Secondary | ICD-10-CM

## 2012-10-27 MED ORDER — OXYCODONE-ACETAMINOPHEN 10-325 MG PO TABS: 1.0000 | ORAL_TABLET | Freq: Four times a day (QID) | ORAL | Status: DC | PRN

## 2012-10-27 MED ORDER — METHYLPREDNISOLONE ACETATE 80 MG/ML IJ SUSP: 80.0000 mg | Freq: Once | INTRAMUSCULAR | Status: DC

## 2012-10-27 NOTE — Patient Instructions (Addendum)
Wt Readings from Last 3 Encounters:  10/27/12 336 lb (152.409 kg)  09/28/12 337 lb 6.4 oz (153.044 kg)  09/21/12 336 lb (152.409 kg)    Postprocedure instructions :    A Band-Aid should be left on for 12 hours. Injection therapy is not a cure itself. It is used in conjunction with other modalities. You can use nonsteroidal anti-inflammatories like ibuprofen , hot and cold compresses. Rest is recommended in the next 24 hours. You need to report immediately  if fever, chills or any signs of infection develop.   Prednisone 10 mg for rash: take 4 tabs a day x 3 days; then 3 tabs a day x 4 days; then 2 tabs a day x 4 days, then 1 tab a day x 6 days, then stop. Take with food.

## 2012-10-27 NOTE — Assessment & Plan Note (Signed)
Will inject °

## 2012-10-27 NOTE — Assessment & Plan Note (Signed)
Prednisone 10 mg: take 4 tabs a day x 3 days; then 3 tabs a day x 4 days; then 2 tabs a day x 4 days, then 1 tab a day x 6 days, then stop. Take pc.  

## 2012-10-27 NOTE — Assessment & Plan Note (Signed)
Continue with current prescription therapy as reflected on the Med list.  

## 2012-10-27 NOTE — Progress Notes (Signed)
Subjective:    HPI  F/u dizzy spells, weakness, blurred vision since 2 wks ago.  A little better. CBG was ok F/u B L>R knee pain - seeing Ortho Dr August Saucer Pt was planning a TKR R in 8/14 - put on hold - trying to loose wt... F/u wt gain  The patient presents for a follow-up of  chronic hypertension, chronic dyslipidemia, type 2 diabetes controlled with medicines; possible CAD F/u rash on face F/u sarcoid - Dr Kellie Simmering thought it was inactive - tapering off prednisone - now is on 10 mg/d  C/o L knee pain -- worse      Wt Readings from Last 3 Encounters:  10/27/12 336 lb (152.409 kg)  09/28/12 337 lb 6.4 oz (153.044 kg)  09/21/12 336 lb (152.409 kg)   BP Readings from Last 3 Encounters:  10/27/12 158/92  09/28/12 129/81  09/21/12 138/84      Review of Systems  Constitutional: Positive for unexpected weight change. Negative for activity change and appetite change.  HENT: Negative for mouth sores and sinus pressure.   Eyes: Negative for visual disturbance.  Respiratory: Negative for chest tightness.   Genitourinary: Negative for difficulty urinating and vaginal pain.  Musculoskeletal: Negative for back pain and gait problem.  Skin: Negative for pallor.  Neurological: Negative for dizziness, tremors and weakness.  Psychiatric/Behavioral: Positive for sleep disturbance. Negative for suicidal ideas, hallucinations, confusion and agitation. The patient is nervous/anxious.         Objective:   Physical Exam  Constitutional: She appears well-developed. No distress.  Obese   HENT:  Head: Normocephalic.  Right Ear: External ear normal.  Left Ear: External ear normal.  Nose: Nose normal.  Mouth/Throat: Oropharynx is clear and moist. No oropharyngeal exudate.  Caries; missing front teeth  Eyes: Conjunctivae are normal. Pupils are equal, round, and reactive to light. Right eye exhibits no discharge. Left eye exhibits no discharge.  Neck: Normal range of motion. Neck  supple. No JVD present. No tracheal deviation present. No thyromegaly present.  Cardiovascular: Normal rate, regular rhythm and normal heart sounds.   Pulmonary/Chest: No stridor. No respiratory distress. She has no wheezes.  Abdominal: Soft. Bowel sounds are normal. She exhibits no distension and no mass. There is no tenderness. There is no rebound and no guarding.  Musculoskeletal: She exhibits no edema and no tenderness.  Lymphadenopathy:    She has no cervical adenopathy.  Neurological: She displays normal reflexes. No cranial nerve deficit. She exhibits normal muscle tone. Coordination normal.  Skin: No rash noted. No erythema.  Psychiatric: She has a normal mood and affect. Her behavior is normal. Judgment and thought content normal.  SD on face Sarcoid lesions on L thigh, L arm, face L knee is tender posteriorly     Lab Results  Component Value Date   WBC 7.2 05/09/2011   HGB 15.6* 05/09/2011   HCT 45.8 05/09/2011   PLT 211.0 05/09/2011   CHOL 193 06/29/2008   TRIG 145.0 06/29/2008   HDL 67.50 06/29/2008   ALT 34 09/07/2012   AST 34 09/07/2012   NA 133* 09/07/2012   K 4.2 09/07/2012   CL 97 09/07/2012   CREATININE 1.0 09/07/2012   BUN 12 09/07/2012   CO2 25 09/07/2012   TSH 1.92 06/25/2012   INR 1.01 10/01/2010   HGBA1C 7.1* 09/17/2012   MICROALBUR 0.5 03/03/2012     Procedure Note :     Procedure : Joint Injection, L  knee   Indication:  Joint osteoarthritis with refractory  chronic pain.   Risks including unsuccessful procedure , bleeding, infection, bruising, skin atrophy, "steroid flare-up" and others were explained to the patient in detail as well as the benefits. Informed consent was obtained and signed.   Tthe patient was placed in a comfortable position. Lateral approach was used. Skin was prepped with Betadine and alcohol  and anesthetized a cooling spray. Then, a 5 cc syringe with a 1.5 inch long 25-gauge needle was used for a joint injection.. The needle was advanced  Into  the knee joint cavity. I aspirated a small amount of intra-articular fluid to confirm correct placement of the needle and injected the joint with 5 mL of 2% lidocaine and 40 mg of Depo-Medrol .  Band-Aid was applied.   Tolerated well. Complications: None. Good pain relief following the procedure.   Postprocedure instructions :    A Band-Aid should be left on for 12 hours. Injection therapy is not a cure itself. It is used in conjunction with other modalities. You can use nonsteroidal anti-inflammatories like ibuprofen , hot and cold compresses. Rest is recommended in the next 24 hours. You need to report immediately  if fever, chills or any signs of infection develop.    Assessment & Plan:

## 2012-10-27 NOTE — Assessment & Plan Note (Signed)
Belviq helped Lap band surg consult

## 2012-11-04 ENCOUNTER — Encounter (HOSPITAL_COMMUNITY): Payer: Medicare Other

## 2012-11-10 ENCOUNTER — Telehealth: Payer: Self-pay | Admitting: *Deleted

## 2012-11-10 NOTE — Telephone Encounter (Signed)
Pt called back to say she will bring her CBG logs to our office on Friday 11/13/12.

## 2012-11-10 NOTE — Telephone Encounter (Signed)
Left mess for patient to call back. i need her CBG logs from home to submit to [facility] for Medicare to reimburse for her diabetic supplies.

## 2012-12-01 ENCOUNTER — Other Ambulatory Visit: Payer: Self-pay | Admitting: Internal Medicine

## 2012-12-01 ENCOUNTER — Telehealth: Payer: Self-pay | Admitting: Endocrinology

## 2012-12-01 ENCOUNTER — Telehealth: Payer: Self-pay | Admitting: *Deleted

## 2012-12-01 NOTE — Telephone Encounter (Signed)
Pls increase Prednisone to 10 mg/d and sch OV w/Dr Kellie Simmering Thx

## 2012-12-01 NOTE — Telephone Encounter (Signed)
Pt called states she is having a Sarcodosis flare.  lpease advise

## 2012-12-01 NOTE — Telephone Encounter (Signed)
Notified pt novolog flexpen would be in refrigerator when she arrives tomorrow to pick it up

## 2012-12-02 NOTE — Telephone Encounter (Signed)
Spoke with pt advised of MDs message 

## 2012-12-15 ENCOUNTER — Other Ambulatory Visit: Payer: Self-pay | Admitting: Internal Medicine

## 2012-12-16 ENCOUNTER — Other Ambulatory Visit: Payer: Self-pay | Admitting: Internal Medicine

## 2012-12-17 ENCOUNTER — Ambulatory Visit: Payer: Medicare Other | Admitting: Endocrinology

## 2012-12-21 DIAGNOSIS — Z79899 Other long term (current) drug therapy: Secondary | ICD-10-CM | POA: Diagnosis not present

## 2012-12-21 DIAGNOSIS — IMO0001 Reserved for inherently not codable concepts without codable children: Secondary | ICD-10-CM | POA: Diagnosis not present

## 2012-12-21 DIAGNOSIS — D869 Sarcoidosis, unspecified: Secondary | ICD-10-CM | POA: Diagnosis not present

## 2012-12-21 DIAGNOSIS — M25469 Effusion, unspecified knee: Secondary | ICD-10-CM | POA: Diagnosis not present

## 2012-12-22 ENCOUNTER — Other Ambulatory Visit: Payer: Self-pay | Admitting: Internal Medicine

## 2012-12-24 ENCOUNTER — Telehealth: Payer: Self-pay | Admitting: *Deleted

## 2012-12-24 MED ORDER — KETOCONAZOLE 2 % EX CREA: TOPICAL_CREAM | Freq: Two times a day (BID) | CUTANEOUS | Status: DC

## 2012-12-24 NOTE — Telephone Encounter (Signed)
Ok Ketoconazole cream Thx 

## 2012-12-24 NOTE — Telephone Encounter (Signed)
Rec fax stating clotrimazole-betamethasone is not covered. Please change to terbinafine, nystatin, ketoconazole, fluconazole or Gris peg. Please advise.

## 2012-12-25 NOTE — Telephone Encounter (Signed)
rx sent to pharmacy

## 2012-12-29 ENCOUNTER — Telehealth: Payer: Self-pay | Admitting: *Deleted

## 2012-12-29 ENCOUNTER — Encounter: Payer: Self-pay | Admitting: Internal Medicine

## 2012-12-29 ENCOUNTER — Ambulatory Visit (INDEPENDENT_AMBULATORY_CARE_PROVIDER_SITE_OTHER): Payer: Medicare Other | Admitting: Internal Medicine

## 2012-12-29 VITALS — BP 140/82 | HR 80 | Temp 96.7°F | Resp 16 | Wt 333.0 lb

## 2012-12-29 DIAGNOSIS — E559 Vitamin D deficiency, unspecified: Secondary | ICD-10-CM

## 2012-12-29 DIAGNOSIS — M25569 Pain in unspecified knee: Secondary | ICD-10-CM

## 2012-12-29 DIAGNOSIS — E669 Obesity, unspecified: Secondary | ICD-10-CM

## 2012-12-29 DIAGNOSIS — M25561 Pain in right knee: Secondary | ICD-10-CM | POA: Insufficient documentation

## 2012-12-29 DIAGNOSIS — Z23 Encounter for immunization: Secondary | ICD-10-CM

## 2012-12-29 DIAGNOSIS — E538 Deficiency of other specified B group vitamins: Secondary | ICD-10-CM

## 2012-12-29 DIAGNOSIS — I10 Essential (primary) hypertension: Secondary | ICD-10-CM

## 2012-12-29 MED ORDER — OXYCODONE-ACETAMINOPHEN 10-325 MG PO TABS: 1.0000 | ORAL_TABLET | Freq: Four times a day (QID) | ORAL | Status: DC | PRN

## 2012-12-29 MED ORDER — METHYLPREDNISOLONE ACETATE 40 MG/ML IJ SUSP: 40.0000 mg | Freq: Once | INTRAMUSCULAR | Status: DC

## 2012-12-29 MED ORDER — TOPIRAMATE 50 MG PO TABS: 50.0000 mg | ORAL_TABLET | Freq: Two times a day (BID) | ORAL | Status: DC

## 2012-12-29 NOTE — Assessment & Plan Note (Signed)
Continue with current prescription therapy as reflected on the Med list.  

## 2012-12-29 NOTE — Telephone Encounter (Signed)
rec fax stating pt is taking both citalopram and nortriptyline. Taking both increases risk of qtc prolongation and is not recommended. Do you still want pt to take both?

## 2012-12-29 NOTE — Patient Instructions (Signed)
Postprocedure instructions :    A Band-Aid should be left on for 12 hours. Injection therapy is not a cure itself. It is used in conjunction with other modalities. You can use nonsteroidal anti-inflammatories like ibuprofen , hot and cold compresses. Rest is recommended in the next 24 hours. You need to report immediately  if fever, chills or any signs of infection develop. 

## 2012-12-29 NOTE — Addendum Note (Signed)
Addended by: Tresa Garter on: 12/29/2012 11:40 PM   Modules accepted: Orders

## 2012-12-29 NOTE — Progress Notes (Signed)
Subjective:    HPI  F/u dizzy spells, weakness, blurred vision since 2 wks ago.  A little better. CBG was ok F/u B L>R knee pain - seeing Ortho Dr August Saucer Pt was planning a TKR R in 8/14 - put on hold - trying to loose wt... F/u wt gain  The patient presents for a follow-up of  chronic hypertension, chronic dyslipidemia, type 2 diabetes controlled with medicines; possible CAD F/u rash on face F/u sarcoid - Dr Kellie Simmering thought it was inactive - tapering off prednisone - now is on 10 mg/d  C/o R knee pain -- worse      Wt Readings from Last 3 Encounters:  12/29/12 333 lb (151.048 kg)  10/27/12 336 lb (152.409 kg)  09/28/12 337 lb 6.4 oz (153.044 kg)   BP Readings from Last 3 Encounters:  12/29/12 140/82  10/27/12 158/92  09/28/12 129/81      Review of Systems  Constitutional: Positive for unexpected weight change. Negative for activity change and appetite change.  HENT: Negative for mouth sores and sinus pressure.   Eyes: Negative for visual disturbance.  Respiratory: Negative for chest tightness.   Genitourinary: Negative for difficulty urinating and vaginal pain.  Musculoskeletal: Negative for back pain and gait problem.  Skin: Negative for pallor.  Neurological: Negative for dizziness, tremors and weakness.  Psychiatric/Behavioral: Positive for sleep disturbance. Negative for suicidal ideas, hallucinations, confusion and agitation. The patient is nervous/anxious.         Objective:   Physical Exam  Constitutional: She appears well-developed. No distress.  Obese   HENT:  Head: Normocephalic.  Right Ear: External ear normal.  Left Ear: External ear normal.  Nose: Nose normal.  Mouth/Throat: Oropharynx is clear and moist. No oropharyngeal exudate.  Caries; missing front teeth  Eyes: Conjunctivae are normal. Pupils are equal, round, and reactive to light. Right eye exhibits no discharge. Left eye exhibits no discharge.  Neck: Normal range of motion. Neck  supple. No JVD present. No tracheal deviation present. No thyromegaly present.  Cardiovascular: Normal rate, regular rhythm and normal heart sounds.   Pulmonary/Chest: No stridor. No respiratory distress. She has no wheezes.  Abdominal: Soft. Bowel sounds are normal. She exhibits no distension and no mass. There is no tenderness. There is no rebound and no guarding.  Musculoskeletal: She exhibits no edema and no tenderness.  Lymphadenopathy:    She has no cervical adenopathy.  Neurological: She displays normal reflexes. No cranial nerve deficit. She exhibits normal muscle tone. Coordination normal.  Skin: No rash noted. No erythema.  Psychiatric: She has a normal mood and affect. Her behavior is normal. Judgment and thought content normal.  SD on face Sarcoid lesions on L thigh, L arm, face R knee is tender      Lab Results  Component Value Date   WBC 7.2 05/09/2011   HGB 15.6* 05/09/2011   HCT 45.8 05/09/2011   PLT 211.0 05/09/2011   CHOL 193 06/29/2008   TRIG 145.0 06/29/2008   HDL 67.50 06/29/2008   ALT 34 09/07/2012   AST 34 09/07/2012   NA 133* 09/07/2012   K 4.2 09/07/2012   CL 97 09/07/2012   CREATININE 1.0 09/07/2012   BUN 12 09/07/2012   CO2 25 09/07/2012   TSH 1.92 06/25/2012   INR 1.01 10/01/2010   HGBA1C 7.1* 09/17/2012   MICROALBUR 0.5 03/03/2012     Procedure Note :     Procedure : Joint Injection, R  knee   Indication:  Joint osteoarthritis with refractory  chronic pain.   Risks including unsuccessful procedure , bleeding, infection, bruising, skin atrophy, "steroid flare-up" and others were explained to the patient in detail as well as the benefits. Informed consent was obtained and signed.   Tthe patient was placed in a comfortable position. Lateral approach was used. Skin was prepped with Betadine and alcohol  and anesthetized a cooling spray. Then, a 5 cc syringe with a 1.5 inch long 25-gauge needle was used for a joint injection.. The needle was advanced  Into the knee  joint cavity. I aspirated a small amount of intra-articular fluid to confirm correct placement of the needle and injected the joint with 5 mL of 2% lidocaine and 40 mg of Depo-Medrol .  Band-Aid was applied.   Tolerated well. Complications: None. Good pain relief following the procedure.   Postprocedure instructions :    A Band-Aid should be left on for 12 hours. Injection therapy is not a cure itself. It is used in conjunction with other modalities. You can use nonsteroidal anti-inflammatories like ibuprofen , hot and cold compresses. Rest is recommended in the next 24 hours. You need to report immediately  if fever, chills or any signs of infection develop.    Assessment & Plan:

## 2012-12-29 NOTE — Assessment & Plan Note (Signed)
Will inject °

## 2012-12-29 NOTE — Assessment & Plan Note (Signed)
D/c Belviq - $$$$ Will try Topamax

## 2012-12-29 NOTE — Progress Notes (Signed)
Pre visit review using our clinic review tool, if applicable. No additional management support is needed unless otherwise documented below in the visit note. 

## 2012-12-30 MED ORDER — ESCITALOPRAM OXALATE 20 MG PO TABS: 20.0000 mg | ORAL_TABLET | Freq: Every day | ORAL | Status: DC

## 2012-12-30 NOTE — Telephone Encounter (Signed)
I'll change Celexa to Lexapro Thx

## 2012-12-31 ENCOUNTER — Other Ambulatory Visit: Payer: Self-pay | Admitting: Internal Medicine

## 2012-12-31 ENCOUNTER — Telehealth: Payer: Self-pay | Admitting: *Deleted

## 2012-12-31 ENCOUNTER — Ambulatory Visit: Payer: Medicare Other | Admitting: Endocrinology

## 2012-12-31 MED ORDER — IBUPROFEN 600 MG PO TABS: 600.0000 mg | ORAL_TABLET | Freq: Two times a day (BID) | ORAL | Status: DC | PRN

## 2012-12-31 NOTE — Telephone Encounter (Signed)
Left message on VM Rx sent 

## 2012-12-31 NOTE — Telephone Encounter (Signed)
Pt called states it will be cheaper if a Rx is written for the Ibuprofen.  Please advise

## 2012-12-31 NOTE — Telephone Encounter (Signed)
Pt informed

## 2012-12-31 NOTE — Telephone Encounter (Signed)
Ok Do not use too much Thx

## 2013-01-04 ENCOUNTER — Telehealth: Payer: Self-pay | Admitting: *Deleted

## 2013-01-04 ENCOUNTER — Other Ambulatory Visit: Payer: Self-pay | Admitting: Internal Medicine

## 2013-01-04 ENCOUNTER — Telehealth: Payer: Self-pay | Admitting: Internal Medicine

## 2013-01-04 MED ORDER — TOPIRAMATE 25 MG PO TABS: 25.0000 mg | ORAL_TABLET | Freq: Two times a day (BID) | ORAL | Status: DC

## 2013-01-04 NOTE — Telephone Encounter (Signed)
Caller: [name]/Patient; Phone: [phone]; Reason for Call: Dr Huntley Dec started patient on Topamax 50 mg BID.  Patient says she was to take 1/2 tablet BID for 2-3 weeks then increase to whole pill BID.   Tried to cut pill but was not able to do so since it was too small so took whole dose - became very sleepy, anxious, slightly dizzy.  Stopped Rx - last dose was Sat 12/20 and Sx have gone away since stopping Rx.  Asking if Topamax 25 mg can be called into pharmacy for her to take until ready to go to full dose.  Uses [facility] [address] AT [address].   Please review, can reach patient at [phone].

## 2013-01-04 NOTE — Telephone Encounter (Signed)
Pt called states Topamax 50mg  tablet is too small to cut.  Pt is requesting new Rx for Topamax 25mg .  Please advise

## 2013-01-04 NOTE — Telephone Encounter (Signed)
Addressed Thx 

## 2013-01-04 NOTE — Telephone Encounter (Signed)
Ok Thx 

## 2013-01-07 ENCOUNTER — Other Ambulatory Visit: Payer: Self-pay | Admitting: Internal Medicine

## 2013-01-12 ENCOUNTER — Other Ambulatory Visit: Payer: Self-pay | Admitting: Internal Medicine

## 2013-01-20 ENCOUNTER — Encounter: Payer: Self-pay | Admitting: Pulmonary Disease

## 2013-01-20 ENCOUNTER — Ambulatory Visit (INDEPENDENT_AMBULATORY_CARE_PROVIDER_SITE_OTHER): Payer: Medicare Other | Admitting: Pulmonary Disease

## 2013-01-20 VITALS — BP 130/78 | HR 75 | Temp 97.9°F | Ht 67.0 in | Wt 344.6 lb

## 2013-01-20 DIAGNOSIS — D869 Sarcoidosis, unspecified: Secondary | ICD-10-CM | POA: Diagnosis not present

## 2013-01-20 DIAGNOSIS — R918 Other nonspecific abnormal finding of lung field: Secondary | ICD-10-CM | POA: Diagnosis not present

## 2013-01-20 DIAGNOSIS — R0602 Shortness of breath: Secondary | ICD-10-CM | POA: Diagnosis not present

## 2013-01-20 NOTE — Assessment & Plan Note (Signed)
The patient is due for a followup CT to reevaluate her pulmonary nodules. These are probably from sarcoidosis.

## 2013-01-20 NOTE — Assessment & Plan Note (Signed)
The patient appears to be stable clinically from a sarcoid perspective. Her last CT did not show any evidence of interstitial lung disease.

## 2013-01-20 NOTE — Progress Notes (Signed)
   Subjective:    Patient ID: [name], female    DOB: February 29, 1960, 53 y.o.   MRN: [id number]  HPI The patient comes in today for followup of her history of sarcoidosis and dyspnea on exertion. She has had a CT of her chest that shows no active disease, but did have a few nodules that need to be monitored. She also had significant coronary calcifications, and was referred to cardiology. The patient was scheduled for a stress test, but has not rescheduled after missing the date.  She continues to complain of dyspnea on exertion, but unfortunately has gained another 8 pounds since last visit. She has gained almost 50 pounds over the last 2 visits. She denies any significant cough or mucus production.   Review of Systems  Constitutional: Negative for fever and unexpected weight change.  HENT: Positive for congestion. Negative for dental problem, ear pain, nosebleeds, postnasal drip, rhinorrhea, sinus pressure, sneezing, sore throat and trouble swallowing.   Eyes: Negative for redness and itching.  Respiratory: Positive for cough, shortness of breath and wheezing. Negative for chest tightness.   Cardiovascular: Positive for leg swelling. Negative for palpitations.  Gastrointestinal: Negative for nausea and vomiting.  Genitourinary: Negative for dysuria.  Musculoskeletal: Positive for joint swelling.  Skin: Negative for rash.  Neurological: Negative for headaches.  Hematological: Bruises/bleeds easily.  Psychiatric/Behavioral: Positive for dysphoric mood. The patient is nervous/anxious.        Objective:   Physical Exam Morbidly obese female in no acute distress Nose without purulence or discharge noted Neck without lymphadenopathy or thyromegaly Chest completely clear to auscultation, no wheezes or crackles Cardiac exam with regular rate and rhythm Lower extremities with edema noted, no cyanosis Alert and oriented, moves all 4 extremities       Assessment & Plan:

## 2013-01-20 NOTE — Patient Instructions (Signed)
Work on getting your stress test re-scheduled. Will check a scan of your chest to re-evaluate your pulmonary nodule.  Will call you with the results. Consider trying water aerobics at the [facility] to help with weight loss.  followup with me again in 42mos.

## 2013-01-20 NOTE — Assessment & Plan Note (Signed)
The patient has significant dyspnea on exertion that I suspect is secondary to her morbid obesity and deconditioning. I cannot exclude underlying cardiac ischemia, and I stressed to her the importance of rescheduling her stress test.

## 2013-01-27 ENCOUNTER — Ambulatory Visit (INDEPENDENT_AMBULATORY_CARE_PROVIDER_SITE_OTHER): Payer: Medicare Other | Admitting: Endocrinology

## 2013-01-27 ENCOUNTER — Encounter: Payer: Self-pay | Admitting: Endocrinology

## 2013-01-27 VITALS — BP 124/82 | HR 92 | Ht 67.0 in | Wt 344.0 lb

## 2013-01-27 DIAGNOSIS — IMO0001 Reserved for inherently not codable concepts without codable children: Secondary | ICD-10-CM

## 2013-01-27 DIAGNOSIS — E1049 Type 1 diabetes mellitus with other diabetic neurological complication: Secondary | ICD-10-CM | POA: Diagnosis not present

## 2013-01-27 DIAGNOSIS — E1165 Type 2 diabetes mellitus with hyperglycemia: Secondary | ICD-10-CM

## 2013-01-27 DIAGNOSIS — IMO0002 Reserved for concepts with insufficient information to code with codable children: Secondary | ICD-10-CM

## 2013-01-27 LAB — HEMOGLOBIN A1C: HEMOGLOBIN A1C: 7.3 % — AB (ref 4.6–6.5)

## 2013-01-27 NOTE — Patient Instructions (Addendum)
check your blood sugar once a day.  vary the time of day when you check, between before the 3 meals, and at bedtime.  also check if you have symptoms of your blood sugar being too high or too low.  please keep a record of the readings and bring it to your next appointment here.  please call us sooner if your blood sugar goes below 70, or if it stays over 200.   Please come back for a follow-up appointment in 3 months.  A diabetes blood test is being requested for you today.  We'll contact you with results.  pending the test results, please continue the same insulins for now.

## 2013-01-27 NOTE — Progress Notes (Signed)
Subjective:    Patient ID: [name], female    DOB: Nov 03, 1960, 53 y.o.   MRN: [id number]  HPI Pt reutrns for f/u of IDDM (dx'ed 2013, when she presented with severe hyperglycemia; she has mild if any neuropathy of the lower extremities; no other known associated complications; she has been on insulin since rx; therapy has been limited by pt's need for a simple regimen; she has never had severe hypoglycemia or DKA).   Her prednisone has been re-increased to 15 mg qd.  pt states she feels well in general, except for vertigo.  no cbg record, but states cbg's are well-controlled.  There is no trend throughout the day.   Past Medical History  Diagnosis Date  . Abdominal pain     rlq  . Urinary tract infection, site not specified   . Sinusitis   . Osteoarthritis   . Sarcoidosis     Dr. Lolita Patella  . Esophageal reflux   . Depression   . Anxiety   . Vitamin D deficiency 2009  . Vitamin B 12 deficiency 2009  . Ruptured appendix   . Hypertension   . OSA (obstructive sleep apnea)   . Allergic rhinitis   . ABDOMINAL PAIN RIGHT LOWER QUADRANT 02/07/2007  . ALLERGIC RHINITIS 10/26/2009  . ANXIETY 08/14/2006  . B12 DEFICIENCY 08/25/2007  . DEPRESSION 08/14/2006  . DYSPNEA 04/28/2009  . ELEVATED BP 07/10/2007  . GRIEF REACTION 08/01/2009  . HYPERGLYCEMIA 12/30/2008  . HYPERTENSION 08/27/2007  . INSOMNIA, CHRONIC 03/07/2010  . NAUSEA 03/07/2010  . OBESITY 03/07/2010  . VISUAL CHANGES 07/02/2007  . VITAMIN D DEFICIENCY 08/25/2007  . Shortness of breath   . Family history of breast cancer     sister    Past Surgical History  Procedure Laterality Date  . Arthroscopic knee surgery  11-12-04    L  . Appendectomy    . Endometrial ablation    . Axillary abcess irrigation and debridement  Jul & Aug2012    History   Social History  . Marital Status: Single    Spouse Name: N/A    Number of Children: N/A  . Years of Education: N/A   Occupational History  . disabled    Social  History Main Topics  . Smoking status: Former Smoker -- 0.50 packs/day for 10 years    Types: Cigarettes    Quit date: 01/14/2002  . Smokeless tobacco: Not on file  . Alcohol Use: No  . Drug Use: No  . Sexual Activity: Not Currently   Other Topics Concern  . Not on file   Social History Narrative   Single, broke up with partner in 2008.    Current Outpatient Prescriptions on File Prior to Visit  Medication Sig Dispense Refill  . aspirin (ASPIRIN CHILDRENS) 81 MG chewable tablet Chew 1 tablet (81 mg total) by mouth daily.  100 tablet  3  . busPIRone (BUSPAR) 10 MG tablet TAKE 1 TABLET BY MOUTH TWICE DAILY  180 tablet  0  . carvedilol (COREG) 12.5 MG tablet TAKE 1 TABLET BY MOUTH TWICE DAILY WITH MEALS  180 tablet  0  . clotrimazole-betamethasone (LOTRISONE) cream APPLY TOPICALLY TWICE A DAY  30 g  1  . Cyanocobalamin (VITAMIN B-12) 1000 MCG SUBL Place 1 tablet (1,000 mcg total) under the tongue daily.  100 tablet  3  . escitalopram (LEXAPRO) 20 MG tablet Take 1 tablet (20 mg total) by mouth daily.  30 tablet  5  . Fluticasone-Salmeterol (ADVAIR)  100-50 MCG/DOSE AEPB Inhale 1 puff into the lungs 2 (two) times daily.  1 each  3  . FREESTYLE LITE test strip TEST TWICE DAILY  100 each  3  . furosemide (LASIX) 40 MG tablet Take 1 tablet (40 mg total) by mouth daily as needed (swelling).  30 tablet  5  . ibuprofen (ADVIL,MOTRIN) 600 MG tablet Take 1 tablet (600 mg total) by mouth 2 (two) times daily as needed.  60 tablet  3  . insulin aspart (NOVOLOG FLEXPEN) 100 UNIT/ML injection Inject 10 Units into the skin daily with supper.       . Insulin Glargine (LANTUS SOLOSTAR) 100 UNIT/ML SOPN 30 Units daily.       [name] ketoconazole (NIZORAL) 2 % cream Apply topically 2 (two) times daily.  45 g  1  . Lancets (FREESTYLE) lancets 1 each by Other route 2 (two) times daily. Use as instructed  100 each  12  . meclizine (ANTIVERT) 12.5 MG tablet Take 1 tablet (12.5 mg total) by mouth 3 (three) times daily  as needed for dizziness or nausea.  60 tablet  1  . Misc. Devices (QUAD CANE) MISC Dispense one cane to pt. Dx: 781.2.  1 each  0  . NEXIUM 40 MG capsule TAKE ONE CAPSULE BY MOUTH DAILY BEFORE BREAKFAST  90 capsule  0  . nortriptyline (PAMELOR) 25 MG capsule TAKE 1 TO 2 CAPSULES BY MOUTH AT BEDTIME FOR INSOMNIA  180 capsule  5  . oxyCODONE-acetaminophen (PERCOCET) 10-325 MG per tablet Take 1 tablet by mouth every 6 (six) hours as needed for pain. Please fill on or after 01/25/13  120 tablet  0  . potassium chloride (K-DUR,KLOR-CON) 10 MEQ tablet TAKE 1 TABLET BY MOUTH DAILY  90 tablet  2  . topiramate (TOPAMAX) 25 MG tablet TAKE 1 TABLET BY MOUTH TWICE DAILY  180 tablet  2   Current Facility-Administered Medications on File Prior to Visit  Medication Dose Route Frequency Provider Last Rate Last Dose  . methylPREDNISolone acetate (DEPO-MEDROL) injection 40 mg  40 mg Intra-articular Once Evie Lacks Plotnikov, MD      . methylPREDNISolone acetate (DEPO-MEDROL) injection 80 mg  80 mg Intra-articular Once Cassandria Anger, MD        Allergies  Allergen Reactions  . Enalapril Maleate     REACTION: cough  . Hydrochlorothiazide     REACTION: Low potassium  . Hydroxychloroquine Sulfate     REACTION: vision changes  . Iodine     Mother was intolerant--CODED on CT table---pt never tired.  . Latex     Hives    Family History  Problem Relation Age of Onset  . Hypertension    . Coronary artery disease      female 1st degree relative <60  . Heart failure      congestive  . Coronary artery disease      Female 1st degree relative <50  . Breast cancer      1st degree relative <50 S  . Heart disease Mother   . Kidney disease Mother   . Cancer Father     leukemia    BP 124/82  Pulse 92  Ht 5\' 7"  (1.702 m)  Wt 344 lb (156.037 kg)  BMI 53.87 kg/m2  SpO2 92%   Review of Systems denies hypoglycemia and weight change.      Objective:   Physical Exam VITAL SIGNS:  See vs page GENERAL:  no distress.  Morbid obesity.  Lab Results  Component Value Date   HGBA1C 7.3* 01/27/2013      Assessment & Plan:  DM: not below 7%, but good control in view of prednisone.  This insulin regimen was chosen from multiple options, as it best matches her insulin to her changing requirements throughout the day.  The benefits of glycemic control must be weighed against the risks of hypoglycemia.   CAD: in this context, she should avoid hypoglycemia. Sarcoidosis: prednisone complicates the rx of DM, but she needs it.

## 2013-02-02 ENCOUNTER — Ambulatory Visit (INDEPENDENT_AMBULATORY_CARE_PROVIDER_SITE_OTHER)
Admission: RE | Admit: 2013-02-02 | Discharge: 2013-02-02 | Disposition: A | Discharge status: Home / Self Care | Payer: Medicare Other | Source: Ambulatory Visit | Attending: Pulmonary Disease | Admitting: Pulmonary Disease

## 2013-02-02 DIAGNOSIS — D869 Sarcoidosis, unspecified: Secondary | ICD-10-CM | POA: Diagnosis not present

## 2013-02-02 DIAGNOSIS — R918 Other nonspecific abnormal finding of lung field: Secondary | ICD-10-CM

## 2013-02-03 ENCOUNTER — Telehealth: Payer: Self-pay | Admitting: Cardiovascular Disease

## 2013-02-03 NOTE — Telephone Encounter (Signed)
New Problem:  Pt is asking how she should take her medication prior to her stress test. Pt would like a call back.

## 2013-02-03 NOTE — Telephone Encounter (Signed)
PT  AWARE  MAY  TAKE PO MEDS AM OF  [facility]  NEED TO HOLD INSULIN  .Adonis Housekeeper

## 2013-02-09 ENCOUNTER — Ambulatory Visit (HOSPITAL_COMMUNITY): Payer: Medicare Other | Attending: Cardiovascular Disease | Admitting: Radiology

## 2013-02-09 VITALS — BP 105/65 | HR 100 | Ht 67.0 in | Wt 342.0 lb

## 2013-02-09 DIAGNOSIS — R079 Chest pain, unspecified: Secondary | ICD-10-CM | POA: Diagnosis not present

## 2013-02-09 DIAGNOSIS — I2510 Atherosclerotic heart disease of native coronary artery without angina pectoris: Secondary | ICD-10-CM | POA: Insufficient documentation

## 2013-02-09 DIAGNOSIS — Z8673 Personal history of transient ischemic attack (TIA), and cerebral infarction without residual deficits: Secondary | ICD-10-CM | POA: Diagnosis not present

## 2013-02-09 DIAGNOSIS — I10 Essential (primary) hypertension: Secondary | ICD-10-CM | POA: Insufficient documentation

## 2013-02-09 DIAGNOSIS — Z8249 Family history of ischemic heart disease and other diseases of the circulatory system: Secondary | ICD-10-CM | POA: Insufficient documentation

## 2013-02-09 DIAGNOSIS — R0609 Other forms of dyspnea: Secondary | ICD-10-CM | POA: Insufficient documentation

## 2013-02-09 DIAGNOSIS — E119 Type 2 diabetes mellitus without complications: Secondary | ICD-10-CM | POA: Diagnosis not present

## 2013-02-09 DIAGNOSIS — R0602 Shortness of breath: Secondary | ICD-10-CM | POA: Diagnosis not present

## 2013-02-09 DIAGNOSIS — R0989 Other specified symptoms and signs involving the circulatory and respiratory systems: Secondary | ICD-10-CM | POA: Insufficient documentation

## 2013-02-09 DIAGNOSIS — Z794 Long term (current) use of insulin: Secondary | ICD-10-CM | POA: Insufficient documentation

## 2013-02-09 DIAGNOSIS — Z87891 Personal history of nicotine dependence: Secondary | ICD-10-CM | POA: Diagnosis not present

## 2013-02-09 MED ORDER — TECHNETIUM TC 99M SESTAMIBI GENERIC - CARDIOLITE
33.0000 | Freq: Once | INTRAVENOUS | Status: AC | PRN
  Administered 2013-02-09: 33 via INTRAVENOUS

## 2013-02-09 MED ORDER — REGADENOSON 0.4 MG/5ML IV SOLN
0.4000 mg | Freq: Once | INTRAVENOUS | Status: AC
  Administered 2013-02-09: 0.4 mg via INTRAVENOUS

## 2013-02-09 NOTE — Progress Notes (Signed)
[facility] [address] [place], [place] [address] [phone]    Cardiology Nuclear Med Study  [name] is a 53 y.o. female     MRN : [id number]     DOB: 1960-01-25  Procedure Date: 02/09/2013  Nuclear Med Background Indication for Stress Test:  Evaluation for Ischemia , and 01-20-13 CT Chest: Coronary Calcifications History:  Cardiac CT (calcifications mid LAD) Cardiac Risk Factors: Family History - CAD, History of Smoking, Hypertension, IDDM, and TIA  Symptoms:  Chest Pain and DOE   Nuclear Pre-Procedure Caffeine/Decaff Intake:  None > 12 hrs NPO After: 7:30pm   Lungs:  Diminished but no wheezing O2 Sat: 92% on room air. IV 0.9% NS with Angio Cath:  22g  IV Site: R Antecubital x 1, tolerated well IV Started by:  Irven Baltimore, RN  Chest Size (in):  46 Cup Size: DDD  Height: 5\' 7"  (1.702 m)  Weight:  342 lb (155.13 kg)  BMI:  Body mass index is 53.55 kg/(m^2). Tech Comments:  No insulin or Coreg this am. Fasting CBG was 127 at 0715. Irven Baltimore, RN.    Nuclear Med Study 1 or 2 day study: 2 day  Stress Test Type:  Carlton Adam  Reading MD: N/A  Order Authorizing Provider:  Jenkins Rouge, MD  Resting Radionuclide: Technetium 63m Sestamibi  Resting Radionuclide Dose: 33.0 mCi on 02/17/13   Stress Radionuclide:  Technetium 46m Sestamibi  Stress Radionuclide Dose: 33.0 mCi on 02/09/13           Stress Protocol Rest HR: 100 Stress HR: 107  Rest BP: 105/65 Stress BP: 121/62  Exercise Time (min): n/a METS: n/a           Dose of Adenosine (mg):  n/a Dose of Lexiscan: 0.4 mg  Dose of Atropine (mg): n/a Dose of Dobutamine: n/a mcg/kg/min (at max HR)  Stress Test Technologist: Glade Lloyd, BS-ES  Nuclear Technologist:  Charlton Amor, CNMT     Rest Procedure:  Myocardial perfusion imaging was performed at rest 45 minutes following the intravenous administration of Technetium 50m Sestamibi. Rest ECG: Sinus rhythm, low  voltage.  Stress Procedure:  The patient received IV Lexiscan 0.4 mg over 15-seconds.  Technetium 31m Sestamibi injected at 30-seconds.  Quantitative spect images were obtained after a 45 minute delay.  During the infusion of Lexiscan, the patient complained of a warm feeling.  This resolved in recovery.  Stress ECG: No significant ST segment change suggestive of ischemia.  QPS Raw Data Images:  Acquisition technically good; normal left ventricular size. Stress Images:  There is decreased uptake in the apex. Rest Images:  Normal homogeneous uptake in all areas of the myocardium. Subtraction (SDS):  These findings are consistent with ischemia. Transient Ischemic Dilatation (Normal <1.22):  0.67 Lung/Heart Ratio (Normal <0.45):  0.40  Quantitative Gated Spect Images QGS EDV:  63 ml QGS ESV:  20 ml  Impression Exercise Capacity:  Lexiscan with no exercise. BP Response:  Normal blood pressure response. Clinical Symptoms:  No chest pain or dyspnea. ECG Impression:  No significant ST segment change suggestive of ischemia. Comparison with Prior Nuclear Study: No previous nuclear study performed  Overall Impression:  Low risk stress nuclear study with a small, mild, partially reversible apical defect consistent with ischemia; findings could also be due to shifting breast attenuation..  LV Ejection Fraction: 68%.  LV Wall Motion:  NL LV Function; NL Wall Motion  [name]

## 2013-02-17 ENCOUNTER — Ambulatory Visit (HOSPITAL_COMMUNITY): Payer: Medicare Other | Attending: Cardiology

## 2013-02-17 DIAGNOSIS — R0989 Other specified symptoms and signs involving the circulatory and respiratory systems: Secondary | ICD-10-CM

## 2013-02-17 MED ORDER — TECHNETIUM TC 99M SESTAMIBI GENERIC - CARDIOLITE
30.0000 | Freq: Once | INTRAVENOUS | Status: AC | PRN
  Administered 2013-02-17: 30 via INTRAVENOUS

## 2013-03-03 ENCOUNTER — Other Ambulatory Visit: Payer: Self-pay | Admitting: Internal Medicine

## 2013-03-05 ENCOUNTER — Telehealth: Payer: Self-pay | Admitting: Internal Medicine

## 2013-03-05 NOTE — Telephone Encounter (Signed)
Pt request referral to dermatology due to skin breaking out.

## 2013-03-08 ENCOUNTER — Other Ambulatory Visit: Payer: Self-pay | Admitting: Internal Medicine

## 2013-03-08 DIAGNOSIS — R21 Rash and other nonspecific skin eruption: Secondary | ICD-10-CM

## 2013-03-08 NOTE — Telephone Encounter (Signed)
pk Thx

## 2013-03-12 ENCOUNTER — Encounter: Payer: Self-pay | Admitting: Pulmonary Disease

## 2013-03-17 ENCOUNTER — Ambulatory Visit (INDEPENDENT_AMBULATORY_CARE_PROVIDER_SITE_OTHER): Payer: Medicare Other | Admitting: Internal Medicine

## 2013-03-17 ENCOUNTER — Encounter: Payer: Self-pay | Admitting: Internal Medicine

## 2013-03-17 VITALS — BP 132/80 | HR 80 | Temp 96.9°F | Resp 16 | Wt 344.0 lb

## 2013-03-17 DIAGNOSIS — F329 Major depressive disorder, single episode, unspecified: Secondary | ICD-10-CM | POA: Diagnosis not present

## 2013-03-17 DIAGNOSIS — F3289 Other specified depressive episodes: Secondary | ICD-10-CM

## 2013-03-17 DIAGNOSIS — IMO0001 Reserved for inherently not codable concepts without codable children: Secondary | ICD-10-CM

## 2013-03-17 DIAGNOSIS — I2510 Atherosclerotic heart disease of native coronary artery without angina pectoris: Secondary | ICD-10-CM | POA: Diagnosis not present

## 2013-03-17 DIAGNOSIS — E538 Deficiency of other specified B group vitamins: Secondary | ICD-10-CM | POA: Diagnosis not present

## 2013-03-17 DIAGNOSIS — IMO0002 Reserved for concepts with insufficient information to code with codable children: Secondary | ICD-10-CM

## 2013-03-17 DIAGNOSIS — E1165 Type 2 diabetes mellitus with hyperglycemia: Secondary | ICD-10-CM

## 2013-03-17 DIAGNOSIS — E669 Obesity, unspecified: Secondary | ICD-10-CM

## 2013-03-17 DIAGNOSIS — D869 Sarcoidosis, unspecified: Secondary | ICD-10-CM

## 2013-03-17 DIAGNOSIS — Z79899 Other long term (current) drug therapy: Secondary | ICD-10-CM | POA: Diagnosis not present

## 2013-03-17 DIAGNOSIS — F410 Panic disorder [episodic paroxysmal anxiety] without agoraphobia: Secondary | ICD-10-CM

## 2013-03-17 MED ORDER — METHYLPREDNISOLONE ACETATE 80 MG/ML IJ SUSP
80.0000 mg | Freq: Once | INTRAMUSCULAR | Status: AC
  Administered 2013-03-17: 80 mg via INTRAMUSCULAR

## 2013-03-17 MED ORDER — OXYCODONE-ACETAMINOPHEN 10-325 MG PO TABS: 1.0000 | ORAL_TABLET | Freq: Four times a day (QID) | ORAL | Status: DC | PRN

## 2013-03-17 MED ORDER — CLONAZEPAM 0.5 MG PO TABS: 0.5000 mg | ORAL_TABLET | Freq: Two times a day (BID) | ORAL | Status: DC | PRN

## 2013-03-17 NOTE — Assessment & Plan Note (Signed)
Diet 

## 2013-03-17 NOTE — Assessment & Plan Note (Signed)
Continue with current prescription therapy as reflected on the Med list.  

## 2013-03-17 NOTE — Assessment & Plan Note (Signed)
Continue with current prescription therapy as reflected on the Med list. Wt Readings from Last 3 Encounters:  03/17/13 344 lb (156.037 kg)  02/09/13 342 lb (155.13 kg)  01/27/13 344 lb (156.037 kg)

## 2013-03-17 NOTE — Assessment & Plan Note (Signed)
3/15  Potential benefits of a long term benzodiazepines  use as well as potential risks  and complications were explained to the patient and were aknowledged. Klonopin prn

## 2013-03-17 NOTE — Progress Notes (Signed)
Subjective:    HPI  C/o panic attacks daily x 2-3 months  Rash is worse since Dr Charlestine Night is tapering off steroids  F/u dizzy spells, weakness, blurred vision since 2 wks ago.  A little better. CBG was ok F/u B L>R knee pain - seeing Ortho Dr Marlou Sa Pt was planning a TKR R in 8/14 - put on hold - trying to loose wt... F/u wt gain  The patient presents for a follow-up of  chronic hypertension, chronic dyslipidemia, type 2 diabetes controlled with medicines; possible CAD F/u rash on face F/u sarcoid - Dr Charlestine Night thought it was inactive - tapering off prednisone - now is on 10 mg/d  C/o R knee pain -- not worse      Wt Readings from Last 3 Encounters:  03/17/13 344 lb (156.037 kg)  02/09/13 342 lb (155.13 kg)  01/27/13 344 lb (156.037 kg)   BP Readings from Last 3 Encounters:  03/17/13 132/80  02/09/13 105/65  01/27/13 124/82      Review of Systems  Constitutional: Positive for unexpected weight change. Negative for activity change and appetite change.  HENT: Negative for mouth sores and sinus pressure.   Eyes: Negative for visual disturbance.  Respiratory: Negative for chest tightness.   Genitourinary: Negative for difficulty urinating and vaginal pain.  Musculoskeletal: Negative for back pain and gait problem.  Skin: Negative for pallor.  Neurological: Negative for dizziness, tremors and weakness.  Psychiatric/Behavioral: Positive for sleep disturbance. Negative for suicidal ideas, hallucinations, confusion and agitation. The patient is nervous/anxious.         Objective:   Physical Exam  Constitutional: She appears well-developed. No distress.  Obese   HENT:  Head: Normocephalic.  Right Ear: External ear normal.  Left Ear: External ear normal.  Nose: Nose normal.  Mouth/Throat: Oropharynx is clear and moist. No oropharyngeal exudate.  Caries; missing front teeth  Eyes: Conjunctivae are normal. Pupils are equal, round, and reactive to light. Right eye  exhibits no discharge. Left eye exhibits no discharge.  Neck: Normal range of motion. Neck supple. No JVD present. No tracheal deviation present. No thyromegaly present.  Cardiovascular: Normal rate, regular rhythm and normal heart sounds.   Pulmonary/Chest: No stridor. No respiratory distress. She has no wheezes.  Abdominal: Soft. Bowel sounds are normal. She exhibits no distension and no mass. There is no tenderness. There is no rebound and no guarding.  Musculoskeletal: She exhibits no edema and no tenderness.  Lymphadenopathy:    She has no cervical adenopathy.  Neurological: She displays normal reflexes. No cranial nerve deficit. She exhibits normal muscle tone. Coordination normal.  Skin: No rash noted. No erythema.  Psychiatric: She has a normal mood and affect. Her behavior is normal. Judgment and thought content normal.  SD on face Sarcoid lesions on L thigh, L arm, face, wrists R knee is less tender      Lab Results  Component Value Date   WBC 7.2 05/09/2011   HGB 15.6* 05/09/2011   HCT 45.8 05/09/2011   PLT 211.0 05/09/2011   CHOL 193 06/29/2008   TRIG 145.0 06/29/2008   HDL 67.50 06/29/2008   ALT 34 09/07/2012   AST 34 09/07/2012   NA 133* 09/07/2012   K 4.2 09/07/2012   CL 97 09/07/2012   CREATININE 1.0 09/07/2012   BUN 12 09/07/2012   CO2 25 09/07/2012   TSH 1.92 06/25/2012   INR 1.01 10/01/2010   HGBA1C 7.3* 01/27/2013   MICROALBUR 0.5 03/03/2012  Assessment & Plan:

## 2013-03-17 NOTE — Assessment & Plan Note (Signed)
Continue with current prescription therapy as reflected on the Med list. Re-start

## 2013-03-17 NOTE — Assessment & Plan Note (Signed)
Restart ASA.

## 2013-03-17 NOTE — Progress Notes (Deleted)
Pre visit review using our clinic review tool, if applicable. No additional management support is needed unless otherwise documented below in the visit note. 

## 2013-03-18 ENCOUNTER — Encounter: Payer: Self-pay | Admitting: Internal Medicine

## 2013-03-18 DIAGNOSIS — L439 Lichen planus, unspecified: Secondary | ICD-10-CM | POA: Diagnosis not present

## 2013-03-19 ENCOUNTER — Telehealth: Payer: Self-pay

## 2013-03-19 NOTE — Telephone Encounter (Signed)
Relevant patient education mailed to patient.  

## 2013-03-25 DIAGNOSIS — L259 Unspecified contact dermatitis, unspecified cause: Secondary | ICD-10-CM | POA: Diagnosis not present

## 2013-03-25 DIAGNOSIS — IMO0001 Reserved for inherently not codable concepts without codable children: Secondary | ICD-10-CM | POA: Diagnosis not present

## 2013-03-25 DIAGNOSIS — M25469 Effusion, unspecified knee: Secondary | ICD-10-CM | POA: Diagnosis not present

## 2013-03-31 ENCOUNTER — Ambulatory Visit: Payer: Medicare Other | Admitting: Internal Medicine

## 2013-04-05 ENCOUNTER — Telehealth: Payer: Self-pay | Admitting: Internal Medicine

## 2013-04-05 DIAGNOSIS — D869 Sarcoidosis, unspecified: Secondary | ICD-10-CM

## 2013-04-05 NOTE — Telephone Encounter (Signed)
Ok Thx 

## 2013-04-05 NOTE — Telephone Encounter (Signed)
Pt sees Dr. Hurley Cisco.  She wants to be referred to another rheumatologists due to him being out of the office several days each month.

## 2013-04-12 ENCOUNTER — Other Ambulatory Visit: Payer: Self-pay | Admitting: Internal Medicine

## 2013-04-15 ENCOUNTER — Encounter: Payer: Self-pay | Admitting: Internal Medicine

## 2013-04-20 NOTE — Telephone Encounter (Signed)
Error

## 2013-04-27 ENCOUNTER — Other Ambulatory Visit (INDEPENDENT_AMBULATORY_CARE_PROVIDER_SITE_OTHER): Payer: Medicare Other

## 2013-04-27 ENCOUNTER — Ambulatory Visit (INDEPENDENT_AMBULATORY_CARE_PROVIDER_SITE_OTHER): Payer: Medicare Other | Admitting: Internal Medicine

## 2013-04-27 ENCOUNTER — Ambulatory Visit: Payer: Medicare Other | Admitting: Endocrinology

## 2013-04-27 ENCOUNTER — Encounter: Payer: Self-pay | Admitting: Internal Medicine

## 2013-04-27 VITALS — BP 120/60 | HR 80 | Temp 97.8°F | Resp 16 | Wt 343.0 lb

## 2013-04-27 DIAGNOSIS — E1165 Type 2 diabetes mellitus with hyperglycemia: Secondary | ICD-10-CM

## 2013-04-27 DIAGNOSIS — IMO0002 Reserved for concepts with insufficient information to code with codable children: Secondary | ICD-10-CM

## 2013-04-27 DIAGNOSIS — I10 Essential (primary) hypertension: Secondary | ICD-10-CM | POA: Diagnosis not present

## 2013-04-27 DIAGNOSIS — D869 Sarcoidosis, unspecified: Secondary | ICD-10-CM

## 2013-04-27 DIAGNOSIS — E538 Deficiency of other specified B group vitamins: Secondary | ICD-10-CM

## 2013-04-27 DIAGNOSIS — IMO0001 Reserved for inherently not codable concepts without codable children: Secondary | ICD-10-CM

## 2013-04-27 DIAGNOSIS — R21 Rash and other nonspecific skin eruption: Secondary | ICD-10-CM

## 2013-04-27 DIAGNOSIS — R413 Other amnesia: Secondary | ICD-10-CM

## 2013-04-27 DIAGNOSIS — E669 Obesity, unspecified: Secondary | ICD-10-CM | POA: Diagnosis not present

## 2013-04-27 DIAGNOSIS — I2510 Atherosclerotic heart disease of native coronary artery without angina pectoris: Secondary | ICD-10-CM | POA: Diagnosis not present

## 2013-04-27 DIAGNOSIS — F410 Panic disorder [episodic paroxysmal anxiety] without agoraphobia: Secondary | ICD-10-CM

## 2013-04-27 LAB — URINALYSIS
Bilirubin Urine: NEGATIVE
Hgb urine dipstick: NEGATIVE
Ketones, ur: NEGATIVE
Leukocytes, UA: NEGATIVE
Nitrite: NEGATIVE
Total Protein, Urine: NEGATIVE
UROBILINOGEN UA: 0.2 (ref 0.0–1.0)
Urine Glucose: NEGATIVE
pH: 6 (ref 5.0–8.0)

## 2013-04-27 LAB — CBC WITH DIFFERENTIAL/PLATELET
BASOS ABS: 0.1 10*3/uL (ref 0.0–0.1)
Basophils Relative: 1.7 % (ref 0.0–3.0)
Eosinophils Absolute: 0.1 10*3/uL (ref 0.0–0.7)
Eosinophils Relative: 1.7 % (ref 0.0–5.0)
HCT: 37.5 % (ref 36.0–46.0)
HEMOGLOBIN: 12.4 g/dL (ref 12.0–15.0)
LYMPHS PCT: 17.6 % (ref 12.0–46.0)
Lymphs Abs: 1.4 10*3/uL (ref 0.7–4.0)
MCHC: 33.2 g/dL (ref 30.0–36.0)
MCV: 94.6 fl (ref 78.0–100.0)
MONOS PCT: 6.6 % (ref 3.0–12.0)
Monocytes Absolute: 0.5 10*3/uL (ref 0.1–1.0)
NEUTROS ABS: 5.7 10*3/uL (ref 1.4–7.7)
Neutrophils Relative %: 72.4 % (ref 43.0–77.0)
PLATELETS: 276 10*3/uL (ref 150.0–400.0)
RBC: 3.97 Mil/uL (ref 3.87–5.11)
RDW: 13.7 % (ref 11.5–14.6)
WBC: 7.9 10*3/uL (ref 4.5–10.5)

## 2013-04-27 LAB — BASIC METABOLIC PANEL
BUN: 14 mg/dL (ref 6–23)
CO2: 26 mEq/L (ref 19–32)
Calcium: 9 mg/dL (ref 8.4–10.5)
Chloride: 103 mEq/L (ref 96–112)
Creatinine, Ser: 1 mg/dL (ref 0.4–1.2)
GFR: 64.68 mL/min (ref >60.00)
GLUCOSE: 107 mg/dL — AB (ref 70–99)
Potassium: 3.8 mEq/L (ref 3.5–5.1)
SODIUM: 137 meq/L (ref 135–145)

## 2013-04-27 LAB — TSH: TSH: 2.08 u[IU]/mL (ref 0.35–5.50)

## 2013-04-27 LAB — HEMOGLOBIN A1C: Hgb A1c MFr Bld: 6.4 % (ref 4.6–6.5)

## 2013-04-27 NOTE — Assessment & Plan Note (Signed)
4/15 could be due to meds Head CT Labs

## 2013-04-27 NOTE — Assessment & Plan Note (Signed)
Continue with current prescription therapy as reflected on the Med list.  

## 2013-04-27 NOTE — Progress Notes (Signed)
Subjective:    HPI  C/o not being able to walk right - "I don't know which foot to put first"  x 2 months. C/o memory issues  C/o L flank and lower ribs pain on L x 1 wk  Rash is worse since Dr Charlestine Night was tapering off steroids  F/u dizzy spells, weakness, blurred vision since 2 wks ago.  A little better. CBG was ok F/u B L>R knee pain - seeing Ortho Dr Marlou Sa Pt was planning a TKR R in 8/14 - put on hold - trying to loose wt... F/u wt gain  The patient presents for a follow-up of  chronic hypertension, chronic dyslipidemia, type 2 diabetes controlled with medicines; possible CAD F/u rash on face F/u sarcoid - Dr Charlestine Night thought it was inactive - tapering off prednisone - now is on 10 mg/d  C/o R knee pain -- not worse      Wt Readings from Last 3 Encounters:  04/27/13 343 lb (155.584 kg)  03/17/13 344 lb (156.037 kg)  02/09/13 342 lb (155.13 kg)   BP Readings from Last 3 Encounters:  04/27/13 120/60  03/17/13 132/80  02/09/13 105/65      Review of Systems  Constitutional: Positive for unexpected weight change. Negative for activity change and appetite change.  HENT: Negative for mouth sores and sinus pressure.   Eyes: Negative for visual disturbance.  Respiratory: Negative for chest tightness.   Genitourinary: Negative for difficulty urinating and vaginal pain.  Musculoskeletal: Negative for back pain and gait problem.  Skin: Negative for pallor.  Neurological: Negative for dizziness, tremors and weakness.  Psychiatric/Behavioral: Positive for sleep disturbance. Negative for suicidal ideas, hallucinations, confusion and agitation. The patient is nervous/anxious.         Objective:   Physical Exam  Constitutional: No distress.  Obese  Chronically ill appearing  HENT:  Head: Normocephalic.  Right Ear: External ear normal.  Left Ear: External ear normal.  Nose: Nose normal.  Mouth/Throat: No oropharyngeal exudate.  Caries; missing front teeth  Eyes:  Conjunctivae are normal. Pupils are equal, round, and reactive to light. Right eye exhibits no discharge. Left eye exhibits no discharge.  Neck: Normal range of motion. Neck supple. No JVD present. No tracheal deviation present. No thyromegaly present.  Cardiovascular: Normal rate, regular rhythm and normal heart sounds.   Pulmonary/Chest: No stridor. No respiratory distress. She has no wheezes. She exhibits tenderness (L lat).  Abdominal: Soft. Bowel sounds are normal. She exhibits no distension and no mass. There is no tenderness. There is no rebound and no guarding.  Musculoskeletal: She exhibits no edema and no tenderness.  Lymphadenopathy:    She has no cervical adenopathy.  Neurological: She displays normal reflexes. No cranial nerve deficit. She exhibits normal muscle tone. Coordination normal.  Skin: No rash noted. No erythema.  Psychiatric: She has a normal mood and affect. Her behavior is normal. Judgment and thought content normal.  SD on face Sarcoid lesions on L thigh, L arm, face, wrists R knee is less tender  L lower lateral ribs are tender to palp No blisters     Lab Results  Component Value Date   WBC 7.2 05/09/2011   HGB 15.6* 05/09/2011   HCT 45.8 05/09/2011   PLT 211.0 05/09/2011   CHOL 193 06/29/2008   TRIG 145.0 06/29/2008   HDL 67.50 06/29/2008   ALT 34 09/07/2012   AST 34 09/07/2012   NA 133* 09/07/2012   K 4.2 09/07/2012   CL  97 09/07/2012   CREATININE 1.0 09/07/2012   BUN 12 09/07/2012   CO2 25 09/07/2012   TSH 1.92 06/25/2012   INR 1.01 10/01/2010   HGBA1C 7.3* 01/27/2013   MICROALBUR 0.5 03/03/2012   Chest CT 2015  Assessment & Plan:    A complex case

## 2013-04-27 NOTE — Progress Notes (Signed)
Pre visit review using our clinic review tool, if applicable. No additional management support is needed unless otherwise documented below in the visit note. 

## 2013-04-27 NOTE — Assessment & Plan Note (Signed)
Wt Readings from Last 3 Encounters:  04/27/13 343 lb (155.584 kg)  03/17/13 344 lb (156.037 kg)  02/09/13 342 lb (155.13 kg)

## 2013-04-27 NOTE — Assessment & Plan Note (Signed)
Per Dermatology.

## 2013-04-27 NOTE — Assessment & Plan Note (Signed)
Per Rheumatology

## 2013-04-29 ENCOUNTER — Encounter: Payer: Self-pay | Admitting: *Deleted

## 2013-05-05 DIAGNOSIS — R5381 Other malaise: Secondary | ICD-10-CM | POA: Diagnosis not present

## 2013-05-05 DIAGNOSIS — IMO0001 Reserved for inherently not codable concepts without codable children: Secondary | ICD-10-CM | POA: Diagnosis not present

## 2013-05-05 DIAGNOSIS — D869 Sarcoidosis, unspecified: Secondary | ICD-10-CM | POA: Diagnosis not present

## 2013-05-05 DIAGNOSIS — R5383 Other fatigue: Secondary | ICD-10-CM | POA: Diagnosis not present

## 2013-05-05 DIAGNOSIS — M25569 Pain in unspecified knee: Secondary | ICD-10-CM | POA: Diagnosis not present

## 2013-05-05 DIAGNOSIS — M2550 Pain in unspecified joint: Secondary | ICD-10-CM | POA: Diagnosis not present

## 2013-05-05 DIAGNOSIS — M25519 Pain in unspecified shoulder: Secondary | ICD-10-CM | POA: Diagnosis not present

## 2013-05-06 ENCOUNTER — Ambulatory Visit (INDEPENDENT_AMBULATORY_CARE_PROVIDER_SITE_OTHER)
Admission: RE | Admit: 2013-05-06 | Discharge: 2013-05-06 | Disposition: A | Discharge status: Home / Self Care | Payer: Medicare Other | Source: Ambulatory Visit | Attending: Internal Medicine | Admitting: Internal Medicine

## 2013-05-06 DIAGNOSIS — S0990XA Unspecified injury of head, initial encounter: Secondary | ICD-10-CM | POA: Diagnosis not present

## 2013-05-06 DIAGNOSIS — R413 Other amnesia: Secondary | ICD-10-CM | POA: Diagnosis not present

## 2013-05-12 DIAGNOSIS — R5383 Other fatigue: Secondary | ICD-10-CM | POA: Diagnosis not present

## 2013-05-12 DIAGNOSIS — Z79899 Other long term (current) drug therapy: Secondary | ICD-10-CM | POA: Diagnosis not present

## 2013-05-12 DIAGNOSIS — R5381 Other malaise: Secondary | ICD-10-CM | POA: Diagnosis not present

## 2013-05-12 DIAGNOSIS — M2550 Pain in unspecified joint: Secondary | ICD-10-CM | POA: Diagnosis not present

## 2013-05-17 ENCOUNTER — Other Ambulatory Visit (HOSPITAL_COMMUNITY): Payer: Self-pay | Admitting: Rheumatology

## 2013-05-17 DIAGNOSIS — R899 Unspecified abnormal finding in specimens from other organs, systems and tissues: Secondary | ICD-10-CM

## 2013-05-17 DIAGNOSIS — M461 Sacroiliitis, not elsewhere classified: Secondary | ICD-10-CM | POA: Diagnosis not present

## 2013-05-17 DIAGNOSIS — M5137 Other intervertebral disc degeneration, lumbosacral region: Secondary | ICD-10-CM | POA: Diagnosis not present

## 2013-05-17 DIAGNOSIS — G894 Chronic pain syndrome: Secondary | ICD-10-CM | POA: Diagnosis not present

## 2013-05-17 DIAGNOSIS — Z79899 Other long term (current) drug therapy: Secondary | ICD-10-CM | POA: Diagnosis not present

## 2013-05-18 ENCOUNTER — Other Ambulatory Visit: Payer: Self-pay | Admitting: Pain Medicine

## 2013-05-18 ENCOUNTER — Ambulatory Visit
Admission: RE | Admit: 2013-05-18 | Discharge: 2013-05-18 | Disposition: A | Discharge status: Home / Self Care | Payer: Medicare Other | Source: Ambulatory Visit | Attending: Pain Medicine | Admitting: Pain Medicine

## 2013-05-18 ENCOUNTER — Telehealth: Payer: Self-pay | Admitting: Internal Medicine

## 2013-05-18 DIAGNOSIS — M25559 Pain in unspecified hip: Secondary | ICD-10-CM | POA: Diagnosis not present

## 2013-05-18 DIAGNOSIS — M25551 Pain in right hip: Secondary | ICD-10-CM

## 2013-05-18 DIAGNOSIS — M47817 Spondylosis without myelopathy or radiculopathy, lumbosacral region: Secondary | ICD-10-CM | POA: Diagnosis not present

## 2013-05-18 DIAGNOSIS — M25552 Pain in left hip: Principal | ICD-10-CM

## 2013-05-18 DIAGNOSIS — M549 Dorsalgia, unspecified: Secondary | ICD-10-CM

## 2013-05-18 DIAGNOSIS — M542 Cervicalgia: Secondary | ICD-10-CM

## 2013-05-18 DIAGNOSIS — M4310 Spondylolisthesis, site unspecified: Secondary | ICD-10-CM | POA: Diagnosis not present

## 2013-05-18 NOTE — Telephone Encounter (Signed)
C/D 05/18/13 for appt.06/04/13

## 2013-05-18 NOTE — Telephone Encounter (Signed)
S/W PATIENT AND GAVE NP APPT FOR 05/22 @ 10:30 W/DR.. CHISM.  REFERRING DR. Benton DX- ELEVATED ESR AND ABN IFE LABS  WELCOME PACKET MAILED.

## 2013-05-19 ENCOUNTER — Ambulatory Visit (INDEPENDENT_AMBULATORY_CARE_PROVIDER_SITE_OTHER): Payer: Medicare Other | Admitting: Internal Medicine

## 2013-05-19 ENCOUNTER — Encounter: Payer: Self-pay | Admitting: Internal Medicine

## 2013-05-19 VITALS — BP 122/70 | HR 90 | Temp 97.7°F | Ht 67.0 in | Wt 339.8 lb

## 2013-05-19 DIAGNOSIS — IMO0001 Reserved for inherently not codable concepts without codable children: Secondary | ICD-10-CM | POA: Diagnosis not present

## 2013-05-19 DIAGNOSIS — I2510 Atherosclerotic heart disease of native coronary artery without angina pectoris: Secondary | ICD-10-CM

## 2013-05-19 DIAGNOSIS — IMO0002 Reserved for concepts with insufficient information to code with codable children: Secondary | ICD-10-CM

## 2013-05-19 DIAGNOSIS — I10 Essential (primary) hypertension: Secondary | ICD-10-CM

## 2013-05-19 DIAGNOSIS — E538 Deficiency of other specified B group vitamins: Secondary | ICD-10-CM

## 2013-05-19 DIAGNOSIS — E1165 Type 2 diabetes mellitus with hyperglycemia: Secondary | ICD-10-CM

## 2013-05-19 DIAGNOSIS — F329 Major depressive disorder, single episode, unspecified: Secondary | ICD-10-CM

## 2013-05-19 DIAGNOSIS — F3289 Other specified depressive episodes: Secondary | ICD-10-CM

## 2013-05-19 DIAGNOSIS — E669 Obesity, unspecified: Secondary | ICD-10-CM | POA: Diagnosis not present

## 2013-05-19 MED ORDER — OXYCODONE-ACETAMINOPHEN 10-325 MG PO TABS: 1.0000 | ORAL_TABLET | Freq: Four times a day (QID) | ORAL | Status: DC | PRN

## 2013-05-19 NOTE — Progress Notes (Signed)
Subjective:    HPI  Pt is s/p a new Rhematology eval by Dr Estanislado Pandy and a Pain Clinic eval by Dr Vira Blanco  F/u not being able to walk right - "I don't know which foot to put first"  x 2 months. C/o memory issues  C/o L flank and lower ribs pain on L x 1 wk  Rash is worse since Dr Charlestine Night was tapering off steroids ??Lichen vs sarcoid: no Prenisone since Dec 2014  F/u dizzy spells, weakness, blurred vision since 2 wks ago.  A little better. CBG was ok F/u B L>R knee pain - seeing Ortho Dr Marlou Sa Pt was planning a TKR R in 8/14 - put on hold - trying to loose wt... F/u wt gain  The patient presents for a follow-up of  chronic hypertension, chronic dyslipidemia, type 2 diabetes controlled with medicines; possible CAD F/u rash on face F/u sarcoid - Dr Charlestine Night thought it was inactive - tapering off prednisone - now is on 10 mg/d  C/o R knee pain -- not well    Wt Readings from Last 3 Encounters:  05/19/13 339 lb 12 oz (154.11 kg)  04/27/13 343 lb (155.584 kg)  03/17/13 344 lb (156.037 kg)   BP Readings from Last 3 Encounters:  05/19/13 122/70  04/27/13 120/60  03/17/13 132/80      Review of Systems  Constitutional: Positive for unexpected weight change. Negative for activity change and appetite change.  HENT: Negative for mouth sores and sinus pressure.   Eyes: Negative for visual disturbance.  Respiratory: Negative for chest tightness.   Genitourinary: Negative for difficulty urinating and vaginal pain.  Musculoskeletal: Negative for back pain and gait problem.  Skin: Negative for pallor.  Neurological: Negative for dizziness, tremors and weakness.  Psychiatric/Behavioral: Positive for sleep disturbance. Negative for suicidal ideas, hallucinations, confusion and agitation. The patient is nervous/anxious.         Objective:   Physical Exam  Constitutional: No distress.  Obese  Chronically ill appearing  HENT:  Head: Normocephalic.  Right Ear: External ear  normal.  Left Ear: External ear normal.  Nose: Nose normal.  Mouth/Throat: No oropharyngeal exudate.  Caries; missing front teeth  Eyes: Conjunctivae are normal. Pupils are equal, round, and reactive to light. Right eye exhibits no discharge. Left eye exhibits no discharge.  Neck: Normal range of motion. Neck supple. No JVD present. No tracheal deviation present. No thyromegaly present.  Cardiovascular: Normal rate, regular rhythm and normal heart sounds.   Pulmonary/Chest: No stridor. No respiratory distress. She has no wheezes. She exhibits tenderness (L lat).  Abdominal: Soft. Bowel sounds are normal. She exhibits no distension and no mass. There is no tenderness. There is no rebound and no guarding.  Musculoskeletal: She exhibits no edema and no tenderness.  Lymphadenopathy:    She has no cervical adenopathy.  Neurological: She displays normal reflexes. No cranial nerve deficit. She exhibits normal muscle tone. Coordination normal.  Skin: No rash noted. No erythema.  Psychiatric: She has a normal mood and affect. Her behavior is normal. Judgment and thought content normal.  SD on face Sarcoid lesions on L thigh, L arm, face, wrists R knee is less tender  L lower lateral ribs are tender to palp Cane      Lab Results  Component Value Date   WBC 7.9 04/27/2013   HGB 12.4 04/27/2013   HCT 37.5 04/27/2013   PLT 276.0 04/27/2013   CHOL 193 06/29/2008   TRIG 145.0 06/29/2008  HDL 67.50 06/29/2008   ALT 34 09/07/2012   AST 34 09/07/2012   NA 137 04/27/2013   K 3.8 04/27/2013   CL 103 04/27/2013   CREATININE 1.0 04/27/2013   BUN 14 04/27/2013   CO2 26 04/27/2013   TSH 2.08 04/27/2013   INR 1.01 10/01/2010   HGBA1C 6.4 04/27/2013   MICROALBUR 0.5 03/03/2012   Chest CT 2015 LS, C-spine, B hips X rays reviewed  Assessment & Plan:    A complex case

## 2013-05-19 NOTE — Progress Notes (Signed)
Pre visit review using our clinic review tool, if applicable. No additional management support is needed unless otherwise documented below in the visit note. 

## 2013-05-19 NOTE — Patient Instructions (Signed)
Try Plan Z diet (Zola's diet)

## 2013-05-23 ENCOUNTER — Encounter: Payer: Self-pay | Admitting: Internal Medicine

## 2013-05-23 NOTE — Assessment & Plan Note (Signed)
Wt Readings from Last 3 Encounters:  05/19/13 339 lb 12 oz (154.11 kg)  04/27/13 343 lb (155.584 kg)  03/17/13 344 lb (156.037 kg)

## 2013-05-23 NOTE — Assessment & Plan Note (Signed)
Continue with current prescription therapy as reflected on the Med list.  

## 2013-05-27 ENCOUNTER — Ambulatory Visit (HOSPITAL_COMMUNITY)
Admission: RE | Admit: 2013-05-27 | Discharge: 2013-05-27 | Disposition: A | Discharge status: Home / Self Care | Payer: Medicare Other | Source: Ambulatory Visit | Attending: Rheumatology | Admitting: Rheumatology

## 2013-05-27 ENCOUNTER — Encounter (HOSPITAL_COMMUNITY): Payer: Medicare Other

## 2013-05-27 DIAGNOSIS — M25569 Pain in unspecified knee: Secondary | ICD-10-CM | POA: Insufficient documentation

## 2013-05-27 DIAGNOSIS — M25579 Pain in unspecified ankle and joints of unspecified foot: Secondary | ICD-10-CM | POA: Diagnosis not present

## 2013-05-27 DIAGNOSIS — M19019 Primary osteoarthritis, unspecified shoulder: Secondary | ICD-10-CM | POA: Diagnosis not present

## 2013-05-27 DIAGNOSIS — M47817 Spondylosis without myelopathy or radiculopathy, lumbosacral region: Secondary | ICD-10-CM | POA: Insufficient documentation

## 2013-05-27 DIAGNOSIS — R899 Unspecified abnormal finding in specimens from other organs, systems and tissues: Secondary | ICD-10-CM

## 2013-05-27 MED ORDER — TECHNETIUM TC 99M MEDRONATE IV KIT
26.9000 | PACK | Freq: Once | INTRAVENOUS | Status: AC | PRN
  Administered 2013-05-27: 26.9 via INTRAVENOUS

## 2013-05-28 ENCOUNTER — Ambulatory Visit: Payer: Medicare Other | Admitting: Endocrinology

## 2013-06-01 ENCOUNTER — Telehealth: Payer: Self-pay | Admitting: *Deleted

## 2013-06-01 DIAGNOSIS — M1990 Unspecified osteoarthritis, unspecified site: Secondary | ICD-10-CM

## 2013-06-01 DIAGNOSIS — E669 Obesity, unspecified: Secondary | ICD-10-CM

## 2013-06-01 NOTE — Telephone Encounter (Signed)
Ok both Thx 

## 2013-06-01 NOTE — Telephone Encounter (Signed)
Pt called requesting order to [facility] for a shower chair and [facility] for [facility] assistance.  Please advise

## 2013-06-02 NOTE — Telephone Encounter (Signed)
Spoke with pt advised orders placed and sent to [facility]

## 2013-06-04 ENCOUNTER — Other Ambulatory Visit (HOSPITAL_BASED_OUTPATIENT_CLINIC_OR_DEPARTMENT_OTHER): Payer: Medicare Other

## 2013-06-04 ENCOUNTER — Ambulatory Visit: Payer: Medicare Other

## 2013-06-04 ENCOUNTER — Other Ambulatory Visit: Payer: Self-pay | Admitting: Internal Medicine

## 2013-06-04 ENCOUNTER — Encounter: Payer: Self-pay | Admitting: Pulmonary Disease

## 2013-06-04 ENCOUNTER — Ambulatory Visit (HOSPITAL_BASED_OUTPATIENT_CLINIC_OR_DEPARTMENT_OTHER): Payer: Medicare Other | Admitting: Internal Medicine

## 2013-06-04 ENCOUNTER — Encounter: Payer: Self-pay | Admitting: Internal Medicine

## 2013-06-04 ENCOUNTER — Telehealth: Payer: Self-pay | Admitting: Internal Medicine

## 2013-06-04 VITALS — BP 126/84 | HR 85 | Temp 96.7°F | Resp 20 | Ht 67.0 in | Wt 341.1 lb

## 2013-06-04 DIAGNOSIS — Z1231 Encounter for screening mammogram for malignant neoplasm of breast: Secondary | ICD-10-CM

## 2013-06-04 DIAGNOSIS — R799 Abnormal finding of blood chemistry, unspecified: Secondary | ICD-10-CM

## 2013-06-04 DIAGNOSIS — Z803 Family history of malignant neoplasm of breast: Secondary | ICD-10-CM

## 2013-06-04 DIAGNOSIS — R779 Abnormality of plasma protein, unspecified: Secondary | ICD-10-CM

## 2013-06-04 DIAGNOSIS — Z139 Encounter for screening, unspecified: Secondary | ICD-10-CM

## 2013-06-04 DIAGNOSIS — Z1239 Encounter for other screening for malignant neoplasm of breast: Secondary | ICD-10-CM

## 2013-06-04 DIAGNOSIS — E8809 Other disorders of plasma-protein metabolism, not elsewhere classified: Secondary | ICD-10-CM | POA: Diagnosis not present

## 2013-06-04 DIAGNOSIS — R778 Other specified abnormalities of plasma proteins: Secondary | ICD-10-CM | POA: Insufficient documentation

## 2013-06-04 LAB — COMPREHENSIVE METABOLIC PANEL (CC13)
ALT: 16 U/L (ref 0–55)
ANION GAP: 11 meq/L (ref 3–11)
AST: 14 U/L (ref 5–34)
Albumin: 2.6 g/dL — ABNORMAL LOW (ref 3.5–5.0)
Alkaline Phosphatase: 77 U/L (ref 40–150)
BILIRUBIN TOTAL: 0.24 mg/dL (ref 0.20–1.20)
BUN: 9.9 mg/dL (ref 7.0–26.0)
CO2: 20 meq/L — AB (ref 22–29)
Calcium: 9 mg/dL (ref 8.4–10.4)
Chloride: 107 mEq/L (ref 98–109)
Creatinine: 0.9 mg/dL (ref 0.6–1.1)
GLUCOSE: 152 mg/dL — AB (ref 70–140)
Potassium: 3.8 mEq/L (ref 3.5–5.1)
Sodium: 138 mEq/L (ref 136–145)
Total Protein: 6.7 g/dL (ref 6.4–8.3)

## 2013-06-04 LAB — CBC WITH DIFFERENTIAL/PLATELET
BASO%: 0.5 % (ref 0.0–2.0)
BASOS ABS: 0 10*3/uL (ref 0.0–0.1)
EOS%: 4.7 % (ref 0.0–7.0)
Eosinophils Absolute: 0.3 10*3/uL (ref 0.0–0.5)
HCT: 40.9 % (ref 34.8–46.6)
HEMOGLOBIN: 13 g/dL (ref 11.6–15.9)
LYMPH%: 22.2 % (ref 14.0–49.7)
MCH: 30.2 pg (ref 25.1–34.0)
MCHC: 31.8 g/dL (ref 31.5–36.0)
MCV: 95.1 fL (ref 79.5–101.0)
MONO#: 0.4 10*3/uL (ref 0.1–0.9)
MONO%: 6.9 % (ref 0.0–14.0)
NEUT#: 3.9 10*3/uL (ref 1.5–6.5)
NEUT%: 65.7 % (ref 38.4–76.8)
Platelets: 265 10*3/uL (ref 145–400)
RBC: 4.3 10*6/uL (ref 3.70–5.45)
RDW: 13.5 % (ref 11.2–14.5)
WBC: 5.9 10*3/uL (ref 3.9–10.3)
lymph#: 1.3 10*3/uL (ref 0.9–3.3)
nRBC: 0 % (ref 0–0)

## 2013-06-04 LAB — LACTATE DEHYDROGENASE (CC13): LDH: 170 U/L (ref 125–245)

## 2013-06-04 NOTE — Telephone Encounter (Signed)
, °

## 2013-06-04 NOTE — Progress Notes (Signed)
[place] [phone]   [phone]  NEW PATIENT EVALUATION   Name: [name] Date: 06/04/2013 MRN: [id number] DOB: [birth year]  PCP: Walker Kehr, MD   REFERRING PHYSICIAN: Plotnikov, Evie Lacks, MD  REASON FOR REFERRAL: Abnormal SPEP/IFE  HISTORY OF PRESENT ILLNESS:[name] is a 53 y.o. female who has a history of multiple co-morbidities including morbid obesity, fibromyalgia, lichen planus, hypertension, hypercholesterolemia, coronary artery disease, depression, diabetes, OSA is being referred by Dr. Bo Merino of [facility] orthopedics for further evaluation of her abnormal SPEP.   She was also evaluated by Dr. Estanislado Pandy of rheumatology for arthralgia and sarcoidosis.  Dr. Estanislado Pandy did an extensive workup to examine autoimmune causes including her CBC, comprehensive panel, sedimentation rate, ACE level, rheumatoid factor, CCP, CK, TSH, hepatitis panel, SPEP and immuno globins and ANA.  She was also referred to a pain clinic.  Her CBC was within normal limits; Her CMP revealed a glucose of 67 but was otherwise within normal limits.  Her ESR was elevated to 53; her acute hepatitis panel was negative.  Her total CK was 35; Her angiotensin CE was 33.  She had a normal TSH.  Her immunoglobulins revealed normal IgG 1300 mg/dL, IgA 347 mg/dL and IgM of 89 mg/dL.  Her rheumatoid factor was negative.  Her ANA was negative.  Her protein electrophoresis with reflex showed a normal gamma of 16.8 % without a detectable M-spike with an area of slightly restricted mobility in the IgG and lambda lanes.  It was suggested a repeat in 6-8 months, if clinically indicated.    Today, she presents alone.  She started going to a pain management clinic May 5th and her next appointment is June the 1st.  She was taken off the ibuprofen.  She was given diclofenac 25 mg bid instead.  She reports that this dis not really help.  She reports the pain in  primarily in her knees.  She is also taking oxycodone prn.  She will follow up with orthopedics next Thursday as well.   She denies weight lost.  She denies fevers or chills or night sweats.  She reports never having a screening a mammogram.  She reports that her father had leukemia and her youngest sister was diagnosed of breast cancer at age 14.  She is still living and the patient is unaware of genetic testing.  She denies any additional cancers.   She reports being disabled since 2009 due to sarcoidosis.  Before disability, she worked at [employer].  She lives alone in [place], [place].  She has one son who lives nearby and checks on her frequently with helping with some her cooking in cleaning. She denies having a screening colonoscopy.  She follows with Dr. Mignon Pine every year and has followed with him over the past decade.   She reports improvement in her rash with application of clotimazone-betamethasone cream twice daily. She used to sleep with CPAP but she had increased panic attacks and she stopped using this.   She follows Dr. 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 She follows Dr. Johnsie Cancel for her cardiology.  Her myocardial perfusion nuclear study on 02/18/2013 revealed a low risk stress nuclear study with a small, mild, partially reversible apical defect consistent with ischemia; findings could also be due to shifting breast attenuation. Her LV Ejection Fraction: 68%. LV Wall Motion: NL LV Function; NL Wall Motion.   PAST MEDICAL  HISTORY:  has a past medical history of Abdominal pain; Urinary tract infection, site not specified; Sinusitis; Osteoarthritis; Sarcoidosis; Esophageal reflux; Depression; Anxiety; Vitamin D deficiency (2009); Vitamin B 12 deficiency (2009); Ruptured appendix; Hypertension; OSA (obstructive sleep apnea); Allergic rhinitis; ABDOMINAL PAIN RIGHT LOWER QUADRANT (02/07/2007); ALLERGIC RHINITIS  (10/26/2009); ANXIETY (08/14/2006); B12 DEFICIENCY (08/25/2007); DEPRESSION (08/14/2006); DYSPNEA (04/28/2009); ELEVATED BP (07/10/2007); GRIEF REACTION (08/01/2009); HYPERGLYCEMIA (12/30/2008); HYPERTENSION (08/27/2007); INSOMNIA, CHRONIC (03/07/2010); NAUSEA (03/07/2010); OBESITY (03/07/2010); VISUAL CHANGES (07/02/2007); VITAMIN D DEFICIENCY (08/25/2007); Shortness of breath; and Family history of breast cancer.     PAST SURGICAL HISTORY: Past Surgical History  Procedure Laterality Date  . Arthroscopic knee surgery  11-12-04    L  . Appendectomy    . Endometrial ablation    . Axillary abcess irrigation and debridement  Jul & Aug2012     CURRENT MEDICATIONS: has a current medication list which includes the following prescription(s): carvedilol, clotrimazole-betamethasone, diclofenac potassium, escitalopram, esomeprazole, freestyle lite, insulin aspart, insulin glargine, freestyle, meclizine, quad cane, nortriptyline, oxycodone-acetaminophen, potassium chloride, topiramate, UNABLE TO FIND, vitamin b-12, and fluticasone-salmeterol, and the following Facility-Administered Medications: methylprednisolone acetate and methylprednisolone acetate.   ALLERGIES: Enalapril maleate; Hydrochlorothiazide; Hydroxychloroquine sulfate; Iodine; and Latex   SOCIAL HISTORY:  reports that she quit smoking about 11 years ago. Her smoking use included Cigarettes. She has a 5 pack-year smoking history. She does not have any smokeless tobacco history on file. She reports that she does not drink alcohol or use illicit drugs.   FAMILY HISTORY: family history includes Breast cancer in an other family member; Cancer in her father; Coronary artery disease in some other family members; Heart disease in her mother; Heart failure in an other family member; Hypertension in an other family member; Kidney disease in her mother.   LABORATORY DATA:  Results for orders placed in visit on 06/04/13 (from the past 48 hour(s))  CBC WITH  DIFFERENTIAL     Status: None   Collection Time    06/04/13 10:56 AM      Result Value Ref Range   WBC 5.9  3.9 - 10.3 10e3/uL   NEUT# 3.9  1.5 - 6.5 10e3/uL   HGB 13.0  11.6 - 15.9 g/dL   HCT 40.9  34.8 - 46.6 %   Platelets 265  145 - 400 10e3/uL   MCV 95.1  79.5 - 101.0 fL   MCH 30.2  25.1 - 34.0 pg   MCHC 31.8  31.5 - 36.0 g/dL   RBC 4.30  3.70 - 5.45 10e6/uL   RDW 13.5  11.2 - 14.5 %   lymph# 1.3  0.9 - 3.3 10e3/uL   MONO# 0.4  0.1 - 0.9 10e3/uL   Eosinophils Absolute 0.3  0.0 - 0.5 10e3/uL   Basophils Absolute 0.0  0.0 - 0.1 10e3/uL   NEUT% 65.7  38.4 - 76.8 %   LYMPH% 22.2  14.0 - 49.7 %   MONO% 6.9  0.0 - 14.0 %   EOS% 4.7  0.0 - 7.0 %   BASO% 0.5  0.0 - 2.0 %   nRBC 0  0 - 0 %       RADIOGRAPHY: Dg Cervical Spine Complete  05/18/2013   CLINICAL DATA:  Chronic pain. Pain on the left side of the body. Sarcoidosis and fibromyalgia.  EXAM: CERVICAL SPINE  4+ VIEWS  COMPARISON:  03/07/2009 CT cervical spine  FINDINGS: Cervical spinal alignment appears anatomic. Cervicothoracic junction appears normal. No significant degenerative disc disease. No bony foraminal stenosis. Facet joints appear  normal.  IMPRESSION: Negative cervical spine radiographs.   Electronically Signed   By: Dereck Ligas M.D.   On: 05/18/2013 13:23   Dg Lumbar Spine Complete  05/18/2013   CLINICAL DATA:  Lumbar spine pain.  Low back pain.  EXAM: LUMBAR SPINE - COMPLETE 4+ VIEW  COMPARISON:  DG LUMBAR SPINE COMPLETE dated 03/07/2009  FINDINGS: Lower lumbar facet arthrosis is present at L4-L5 and L5-S1. Vertebral body height is preserved. Mild degenerative disc disease. Aortic atherosclerosis. 2 mm anterolisthesis of L5 on S1 appears degenerative and facet mediated. 13 mm radiopaque density is projected over the right abdomen, probably representing contents in the enteric stream. Bilateral SI joint degenerative disease is present with vacuum joint.  IMPRESSION: 1. No acute osseous abnormality. 2. Mild lumbar  spondylosis. 3. L4-L5 and L5-S1 facet arthrosis. Unchanged 2 mm anterolisthesis of L5 on S1 appears degenerative and facet mediated   Electronically Signed   By: Dereck Ligas M.D.   On: 05/18/2013 13:31   Dg Hip Complete Left  05/18/2013   CLINICAL DATA:  bilateral hip pain, back pain, cervicaligia  EXAM: LEFT HIP - COMPLETE 2+ VIEW  COMPARISON:  DG KNEE COMPLETE 4 VIEWS*L* dated 07/19/2011; DG HIP COMPLETE*R* dated 05/18/2013; DG LUMBAR SPINE COMPLETE 4+V dated 05/18/2013; DG CERVICAL SPINE COMPLETE 4+V dated 05/18/2013  FINDINGS: Study is degraded by obese body habitus. Panniculus projects over the left hip. There is no fracture. The visualize pelvic rings appear within normal limits. Left hip joint space is preserved.  IMPRESSION: Negative.   Electronically Signed   By: Dereck Ligas M.D.   On: 05/18/2013 13:32   Dg Hip Complete Right  05/18/2013   CLINICAL DATA:  bilateral hip pain,back pain, cervicalgia  EXAM: RIGHT HIP - COMPLETE 2+ VIEW  COMPARISON:  MR KNEE*R* W/O CM dated 04/21/2012  FINDINGS: Visualization is degraded by obese body habitus. The panniculus projects over the right hip. No fracture. Right hip joint space appears normal. No femoral head osteophytes.  IMPRESSION: Negative.   Electronically Signed   By: Dereck Ligas M.D.   On: 05/18/2013 13:24   Ct Head Wo Contrast  05/06/2013   CLINICAL DATA:  Memory loss.  Falls.  Leg weakness.  EXAM: CT HEAD WITHOUT CONTRAST  TECHNIQUE: Contiguous axial images were obtained from the base of the skull through the vertex without intravenous contrast.  COMPARISON:  None.  FINDINGS: Atrophy is mildly advanced for age. Mild chronic microvascular ischemic change in the white matter.  Negative for acute infarct, hemorrhage, mass.  No acute skull abnormality. Apparent secretions in the left nasal cavity.  IMPRESSION: Mild atrophy and mild chronic microvascular ischemic change. No acute intracranial abnormality.   Electronically Signed   By: Franchot Gallo M.D.    On: 05/06/2013 17:33   Nm Bone Scan Whole Body  05/27/2013   CLINICAL DATA:  Bilateral knee and ankle pain. History of ankle fracture.  EXAM: NUCLEAR MEDICINE WHOLE BODY BONE SCAN  TECHNIQUE: Whole body anterior and posterior images were obtained approximately 3 hours after intravenous injection of radiopharmaceutical.  RADIOPHARMACEUTICALS:  26.9 Technetium-99 MDP  COMPARISON:  Plain film radiographs of the lumbar spine, left hip, right hip, and cervical spine from 05/18/2013  FINDINGS: Exam detail is diminished secondary to patient's body habitus. There is normal physiologic tracer activity involving the kidneys an urinary bladder. Arthropathic changes are noted involving both sternoclavicular joints and both shoulder joints. There are also degenerative changes involving the lower lumbar spine facet joints as well as both  knees and the midfoot tarsal region of both feet. No abnormal radiotracer activity is identified to suggest fracture. There is increased uptake localizing to the right side of the mandible which likely reflects periodontal disease.  IMPRESSION: 1. No findings to suggest metastatic disease or fracture. 2. Degenerative changes as above.   Electronically Signed   By: Kerby Moors M.D.   On: 05/27/2013 16:07       REVIEW OF SYSTEMS:  Constitutional: Denies fevers, chills or abnormal weight loss Eyes: Denies blurriness of vision Ears, nose, mouth, throat, and face: Denies mucositis or sore throat Respiratory: she reports a productive cough, but denies dyspnea or wheezes Cardiovascular: Denies palpitation, chest discomfort or lower extremity swelling Gastrointestinal:  Denies nausea, heartburn or change in bowel habits Skin: Denies abnormal skin rashes Lymphatics: Denies new lymphadenopathy or easy bruising Neurological:Denies numbness, tingling or new weaknesses Behavioral/Psych: Mood is stable, no new changes  All other systems were reviewed with the patient and are  negative.  PHYSICAL EXAM:  height is 5' 7"  (1.702 m) and weight is 341 lb 1.6 oz (154.722 kg). Her oral temperature is 96.7 F (35.9 C). Her blood pressure is 126/84 and her pulse is 85. Her respiration is 20 and oxygen saturation is 94%.    GENERAL:alert, no distress and comfortable; morbidly obese SKIN: skin color, texture, turgor are normal, no rashes or significant lesions; crusty lesions on her wrist bilaterally and both thighs EYES: normal, Conjunctiva are pink and non-injected, sclera clear OROPHARYNX:no exudate, no erythema and lips, buccal mucosa, and tongue normal  NECK: supple, thyroid normal size, non-tender, without nodularity LYMPH:  no palpable lymphadenopathy in the cervical, axillary or inguinal LUNGS: clear to auscultation and percussion with normal breathing effort HEART: regular rate & rhythm and no murmurs and no lower extremity edema ABDOMEN:abdomen soft, non-tender and normal bowel sounds Musculoskeletal:no cyanosis of digits and no clubbing  NEURO: alert & oriented x 3 with fluent speech, no focal motor/sensory deficits; ambulates via a cane.    IMPRESSION: [name] is a 53 y.o. female with a history of    PLAN:  1.  Abnormal SPE/IFE.  -- She does not have an M-spike and her SPEP appears polyclonal.  I do no think she has MGUS.  We will repeat her SPEP and IFE plus Kappa/lambda light chains today.  She will follow in 6 months for repeat markers as well.  She does not have any evidence of anemia, hypercalcemia or creatinine dysfunction suggestive of Multiple myeloma.  We discussed with patient extensively what MGUS was and we will further exclude that she has it with the test as noted above.   2. Screening mammogram.  --She reports a first degree relative with breast cancer. She she have a screening mammogram. We ordered it with V76.12 code.  We will schedule this for 06/26.   3. Follow-up. --Patient will also follow up with her PCP for consideration  of screening colonoscopy.   She will require repeat MM markers and labs in 6 months with a follow up.    All questions were answered. The patient knows to call the clinic with any problems, questions or concerns. We can certainly see the patient much sooner if necessary.  I spent 30 minutes counseling the patient face to face. The total time spent in the appointment was 45 minutes.    Concha Norway, MD 06/04/2013 11:26 AM

## 2013-06-09 ENCOUNTER — Ambulatory Visit (INDEPENDENT_AMBULATORY_CARE_PROVIDER_SITE_OTHER): Payer: Medicare Other | Admitting: Endocrinology

## 2013-06-09 VITALS — BP 124/60 | HR 94 | Temp 97.9°F | Ht 67.0 in | Wt 338.0 lb

## 2013-06-09 DIAGNOSIS — I2510 Atherosclerotic heart disease of native coronary artery without angina pectoris: Secondary | ICD-10-CM

## 2013-06-09 DIAGNOSIS — E1049 Type 1 diabetes mellitus with other diabetic neurological complication: Secondary | ICD-10-CM | POA: Diagnosis not present

## 2013-06-09 LAB — SPEP & IFE WITH QIG
Albumin ELP: 44 % — ABNORMAL LOW (ref 55.8–66.1)
Alpha-1-Globulin: 10.2 % — ABNORMAL HIGH (ref 2.9–4.9)
Alpha-2-Globulin: 15 % — ABNORMAL HIGH (ref 7.1–11.8)
Beta 2: 6.3 % (ref 3.2–6.5)
Beta Globulin: 8.3 % — ABNORMAL HIGH (ref 4.7–7.2)
GAMMA GLOBULIN: 16.2 % (ref 11.1–18.8)
IGA: 333 mg/dL (ref 69–380)
IGG (IMMUNOGLOBIN G), SERUM: 1100 mg/dL (ref 690–1700)
IgM, Serum: 83 mg/dL (ref 52–322)
Total Protein, Serum Electrophoresis: 6.2 g/dL (ref 6.0–8.3)

## 2013-06-09 LAB — KAPPA/LAMBDA LIGHT CHAINS
KAPPA FREE LGHT CHN: 2.4 mg/dL — AB (ref 0.33–1.94)
Kappa:Lambda Ratio: 1.21 (ref 0.26–1.65)
Lambda Free Lght Chn: 1.99 mg/dL (ref 0.57–2.63)

## 2013-06-09 NOTE — Progress Notes (Signed)
Subjective:    Patient ID: [name], female    DOB: 21-Nov-1960, 53 y.o.   MRN: [id number]  HPI Pt reutrns for f/u of IDDM (dx'ed 2013, when she presented with severe hyperglycemia; she has mild neuropathy of the lower extremities; no other known associated complications; she has been on insulin since dx; pt needs a simple regimen, due to low functional level; she has never had pancreatitis, severe hypoglycemia, or DKA; she cannot undergo weight-loss surgery, as she has medicaid).   She is off prednisone now.  no cbg record, but states cbg's are well-controlled.  There is no trend throughout the day.  She wants to see if she can get off insulin.   Past Medical History  Diagnosis Date  . Abdominal pain     rlq  . Urinary tract infection, site not specified   . Sinusitis   . Osteoarthritis   . Sarcoidosis     Dr. Lolita Patella  . Esophageal reflux   . Depression   . Anxiety   . Vitamin D deficiency 2009  . Vitamin B 12 deficiency 2009  . Ruptured appendix   . Hypertension   . OSA (obstructive sleep apnea)   . Allergic rhinitis   . ABDOMINAL PAIN RIGHT LOWER QUADRANT 02/07/2007  . ALLERGIC RHINITIS 10/26/2009  . ANXIETY 08/14/2006  . B12 DEFICIENCY 08/25/2007  . DEPRESSION 08/14/2006  . DYSPNEA 04/28/2009  . ELEVATED BP 07/10/2007  . GRIEF REACTION 08/01/2009  . HYPERGLYCEMIA 12/30/2008  . HYPERTENSION 08/27/2007  . INSOMNIA, CHRONIC 03/07/2010  . NAUSEA 03/07/2010  . OBESITY 03/07/2010  . VISUAL CHANGES 07/02/2007  . VITAMIN D DEFICIENCY 08/25/2007  . Shortness of breath   . Family history of breast cancer     sister    Past Surgical History  Procedure Laterality Date  . Arthroscopic knee surgery  11-12-04    L  . Appendectomy    . Endometrial ablation    . Axillary abcess irrigation and debridement  Jul & Aug2012    History   Social History  . Marital Status: Single    Spouse Name: N/A    Number of Children: N/A  . Years of Education: N/A   Occupational  History  . disabled    Social History Main Topics  . Smoking status: Former Smoker -- 0.50 packs/day for 10 years    Types: Cigarettes    Quit date: 01/14/2002  . Smokeless tobacco: Not on file  . Alcohol Use: No  . Drug Use: No  . Sexual Activity: Not Currently   Other Topics Concern  . Not on file   Social History Narrative   Single, broke up with partner in 2008.      Allergies  Allergen Reactions  . Enalapril Maleate     REACTION: cough  . Hydrochlorothiazide     REACTION: Low potassium  . Hydroxychloroquine Sulfate     REACTION: vision changes  . Iodine     Mother was intolerant--CODED on CT table---pt never tired.  . Latex     Hives    Family History  Problem Relation Age of Onset  . Hypertension    . Coronary artery disease      female 1st degree relative <60  . Heart failure      congestive  . Coronary artery disease      Female 1st degree relative <50  . Breast cancer      1st degree relative <50 S  . Heart disease Mother   .  Kidney disease Mother   . Cancer Father     leukemia    BP 124/60  Pulse 94  Temp(Src) 97.9 F (36.6 C) (Oral)  Ht 5\' 7"  (1.702 m)  Wt 338 lb (153.316 kg)  BMI 52.93 kg/m2  SpO2 96%  LMP 05/19/2003  Review of Systems She denies hypoglycemia and weight change.      Objective:   Physical Exam Pulses: dorsalis pedis intact bilat.  Feet: no deformity. feet are of normal color and temp. 1+ bilat leg edema.  Skin: no ulcer on the feet.  Lichen planus is noted. Neuro: sensation is intact to touch on the feet, but decreased from normal.   Lab Results  Component Value Date   HGBA1C 6.4 04/27/2013      Assessment & Plan:  DM: overcontrolled. Neuropathy: this limits exercise rx of DM: This impairs the ability to achieve glycemic control.  I'll work around this as best I can. Morbid obesity: she can't undergo surgery, for financial reasons.  This impairs the ability to achieve glycemic control.  I'll work around this as  best I can.   Patient Instructions  check your blood sugar once a day.  vary the time of day when you check, between before the 3 meals, and at bedtime.  also check if you have symptoms of your blood sugar being too high or too low.  please keep a record of the readings and bring it to your next appointment here.  please call us sooner if your blood sugar goes below 70, or if it stays over 200.   Please come back for a follow-up appointment in 6 weeks.  Please reduce the lantus to 20 units daily.  We'll see if you can get off insulin.

## 2013-06-09 NOTE — Patient Instructions (Addendum)
check your blood sugar once a day.  vary the time of day when you check, between before the 3 meals, and at bedtime.  also check if you have symptoms of your blood sugar being too high or too low.  please keep a record of the readings and bring it to your next appointment here.  please call us sooner if your blood sugar goes below 70, or if it stays over 200.   Please come back for a follow-up appointment in 6 weeks.  Please reduce the lantus to 20 units daily.  We'll see if you can get off insulin.

## 2013-06-11 ENCOUNTER — Other Ambulatory Visit: Payer: Self-pay

## 2013-06-11 ENCOUNTER — Telehealth: Payer: Self-pay | Admitting: Endocrinology

## 2013-06-11 MED ORDER — INSULIN ASPART 100 UNIT/ML ~~LOC~~ SOLN: 10.0000 [IU] | Freq: Every day | SUBCUTANEOUS | Status: DC

## 2013-06-11 NOTE — Telephone Encounter (Signed)
Patient states that her novolog was never sent into pharmacy   [facility] and [facility]   Please call patient to let her know if completed  Thank You

## 2013-06-11 NOTE — Telephone Encounter (Signed)
Rx sent to pharmacy. Pt notified. 

## 2013-06-14 DIAGNOSIS — G894 Chronic pain syndrome: Secondary | ICD-10-CM | POA: Diagnosis not present

## 2013-06-14 DIAGNOSIS — IMO0001 Reserved for inherently not codable concepts without codable children: Secondary | ICD-10-CM | POA: Diagnosis not present

## 2013-06-14 DIAGNOSIS — M5137 Other intervertebral disc degeneration, lumbosacral region: Secondary | ICD-10-CM | POA: Diagnosis not present

## 2013-06-17 DIAGNOSIS — M1710 Unilateral primary osteoarthritis, unspecified knee: Secondary | ICD-10-CM | POA: Diagnosis not present

## 2013-06-22 ENCOUNTER — Telehealth: Payer: Self-pay | Admitting: *Deleted

## 2013-06-22 NOTE — Telephone Encounter (Signed)
Pt called requesting Oxycodone refill.  Last refill 5.6.15.  Last OV 5.6.15.  Please advise

## 2013-06-23 ENCOUNTER — Telehealth: Payer: Self-pay | Admitting: Internal Medicine

## 2013-06-23 DIAGNOSIS — R21 Rash and other nonspecific skin eruption: Secondary | ICD-10-CM

## 2013-06-23 NOTE — Telephone Encounter (Signed)
I notified the pt and verified that she was going to a pain clinic, pt stated yes.  I told the patient that she would need to contact the pain clinic for them to see about her oxycodone. Pt stated that she would call them

## 2013-06-23 NOTE — Telephone Encounter (Signed)
I thought she started w/a Pain Clinic. Thx

## 2013-06-23 NOTE — Telephone Encounter (Signed)
Ronalee Belts called from [facility] to verifify that Dr. Camila Li is not going to write Mrs. Kalaheo rx for oxycodone. Please call Ronalee Belts

## 2013-06-29 ENCOUNTER — Other Ambulatory Visit: Payer: Self-pay | Admitting: Internal Medicine

## 2013-06-30 ENCOUNTER — Other Ambulatory Visit: Payer: Self-pay | Admitting: Internal Medicine

## 2013-07-06 ENCOUNTER — Encounter (INDEPENDENT_AMBULATORY_CARE_PROVIDER_SITE_OTHER): Payer: Self-pay

## 2013-07-06 ENCOUNTER — Ambulatory Visit
Admission: RE | Admit: 2013-07-06 | Discharge: 2013-07-06 | Disposition: A | Discharge status: Home / Self Care | Payer: Medicare Other | Source: Ambulatory Visit | Attending: Internal Medicine | Admitting: Internal Medicine

## 2013-07-06 DIAGNOSIS — Z1231 Encounter for screening mammogram for malignant neoplasm of breast: Secondary | ICD-10-CM | POA: Diagnosis not present

## 2013-07-06 NOTE — Telephone Encounter (Signed)
Pt left vm requesting referral to Dr. Merryl Hacker (derm) for the week of July 13th. Please advise.

## 2013-07-07 NOTE — Telephone Encounter (Signed)
Ok Done Thx 

## 2013-07-08 DIAGNOSIS — L819 Disorder of pigmentation, unspecified: Secondary | ICD-10-CM | POA: Diagnosis not present

## 2013-07-08 DIAGNOSIS — Z79899 Other long term (current) drug therapy: Secondary | ICD-10-CM | POA: Diagnosis not present

## 2013-07-08 DIAGNOSIS — L439 Lichen planus, unspecified: Secondary | ICD-10-CM | POA: Diagnosis not present

## 2013-07-09 ENCOUNTER — Other Ambulatory Visit: Payer: Self-pay | Admitting: Endocrinology

## 2013-07-09 ENCOUNTER — Other Ambulatory Visit: Payer: Self-pay | Admitting: Internal Medicine

## 2013-07-14 ENCOUNTER — Other Ambulatory Visit: Payer: Self-pay | Admitting: Internal Medicine

## 2013-07-14 ENCOUNTER — Telehealth: Payer: Self-pay | Admitting: Internal Medicine

## 2013-07-14 ENCOUNTER — Ambulatory Visit: Payer: Medicare Other | Admitting: Internal Medicine

## 2013-07-14 DIAGNOSIS — E1049 Type 1 diabetes mellitus with other diabetic neurological complication: Secondary | ICD-10-CM

## 2013-07-14 NOTE — Telephone Encounter (Signed)
Your pt did not show up for an appt today. Please advise.

## 2013-07-15 NOTE — Telephone Encounter (Signed)
Noted. Thx.

## 2013-07-17 ENCOUNTER — Other Ambulatory Visit: Payer: Self-pay | Admitting: Internal Medicine

## 2013-07-20 ENCOUNTER — Ambulatory Visit: Payer: Medicare Other | Admitting: Internal Medicine

## 2013-07-20 ENCOUNTER — Ambulatory Visit: Payer: Medicare Other | Admitting: Pulmonary Disease

## 2013-07-20 DIAGNOSIS — M47817 Spondylosis without myelopathy or radiculopathy, lumbosacral region: Secondary | ICD-10-CM | POA: Diagnosis not present

## 2013-07-21 ENCOUNTER — Ambulatory Visit: Payer: Medicare Other | Admitting: Endocrinology

## 2013-07-26 ENCOUNTER — Ambulatory Visit: Payer: Medicare Other | Admitting: Pulmonary Disease

## 2013-07-29 ENCOUNTER — Ambulatory Visit: Payer: Medicare Other | Admitting: Internal Medicine

## 2013-08-02 ENCOUNTER — Encounter: Payer: Self-pay | Admitting: Endocrinology

## 2013-08-02 ENCOUNTER — Ambulatory Visit (INDEPENDENT_AMBULATORY_CARE_PROVIDER_SITE_OTHER): Payer: Medicare Other | Admitting: Endocrinology

## 2013-08-02 VITALS — BP 118/78 | HR 100 | Temp 97.9°F | Ht 67.0 in | Wt 338.0 lb

## 2013-08-02 DIAGNOSIS — E1049 Type 1 diabetes mellitus with other diabetic neurological complication: Secondary | ICD-10-CM

## 2013-08-02 DIAGNOSIS — I2510 Atherosclerotic heart disease of native coronary artery without angina pectoris: Secondary | ICD-10-CM | POA: Diagnosis not present

## 2013-08-02 LAB — HEMOGLOBIN A1C: Hgb A1c MFr Bld: 6.8 % — ABNORMAL HIGH (ref 4.6–6.5)

## 2013-08-02 NOTE — Patient Instructions (Addendum)
check your blood sugar once a day.  vary the time of day when you check, between before the 3 meals, and at bedtime.  also check if you have symptoms of your blood sugar being too high or too low.  please keep a record of the readings and bring it to your next appointment here.  please call us sooner if your blood sugar goes below 70, or if it stays over 200.   Please come back for a follow-up appointment in 6 weeks.  blood tests are being requested for you today.  We'll contact you with results.  Maybe you can get off insulin.

## 2013-08-02 NOTE — Progress Notes (Signed)
Subjective:    Patient ID: [name], female    DOB: 1960/10/15, 53 y.o.   MRN: [id number]  HPI Pt reutrns for f/u of IDDM (dx'ed 2013, when she presented with severe hyperglycemia; she has mild neuropathy of the lower extremities; no other known associated complications; she has been on insulin since dx; pt needs a simple regimen, due to low functional level; she has never had pancreatitis, severe hypoglycemia, or DKA; she cannot undergo weight-loss surgery, as she has medicaid).   She wants to see if she can get off insulin.  She has not recently taken prednisone (she was on it for sarcoidosis).  no cbg record, but states cbg's are well-controlled.  Past Medical History  Diagnosis Date  . Abdominal pain     rlq  . Urinary tract infection, site not specified   . Sinusitis   . Osteoarthritis   . Sarcoidosis     Dr. Lolita Patella  . Esophageal reflux   . Depression   . Anxiety   . Vitamin D deficiency 2009  . Vitamin B 12 deficiency 2009  . Ruptured appendix   . Hypertension   . OSA (obstructive sleep apnea)   . Allergic rhinitis   . ABDOMINAL PAIN RIGHT LOWER QUADRANT 02/07/2007  . ALLERGIC RHINITIS 10/26/2009  . ANXIETY 08/14/2006  . B12 DEFICIENCY 08/25/2007  . DEPRESSION 08/14/2006  . DYSPNEA 04/28/2009  . ELEVATED BP 07/10/2007  . GRIEF REACTION 08/01/2009  . HYPERGLYCEMIA 12/30/2008  . HYPERTENSION 08/27/2007  . INSOMNIA, CHRONIC 03/07/2010  . NAUSEA 03/07/2010  . OBESITY 03/07/2010  . VISUAL CHANGES 07/02/2007  . VITAMIN D DEFICIENCY 08/25/2007  . Shortness of breath   . Family history of breast cancer     sister    Past Surgical History  Procedure Laterality Date  . Arthroscopic knee surgery  11-12-04    L  . Appendectomy    . Endometrial ablation    . Axillary abcess irrigation and debridement  Jul & Aug2012    History   Social History  . Marital Status: Single    Spouse Name: N/A    Number of Children: N/A  . Years of Education: N/A   Occupational  History  . disabled    Social History Main Topics  . Smoking status: Former Smoker -- 0.50 packs/day for 10 years    Types: Cigarettes    Quit date: 01/14/2002  . Smokeless tobacco: Not on file  . Alcohol Use: No  . Drug Use: No  . Sexual Activity: Not Currently   Other Topics Concern  . Not on file   Social History Narrative   Single, broke up with partner in 2008.      Allergies  Allergen Reactions  . Enalapril Maleate     REACTION: cough  . Hydrochlorothiazide     REACTION: Low potassium  . Hydroxychloroquine Sulfate     REACTION: vision changes  . Iodine     Mother was intolerant--CODED on CT table---pt never tired.  . Latex     Hives    Family History  Problem Relation Age of Onset  . Hypertension    . Coronary artery disease      female 1st degree relative <60  . Heart failure      congestive  . Coronary artery disease      Female 1st degree relative <50  . Breast cancer      1st degree relative <50 S  . Heart disease Mother   .  Kidney disease Mother   . Cancer Father     leukemia    BP 118/78  Pulse 100  Temp(Src) 97.9 F (36.6 C) (Oral)  Ht 5\' 7"  (1.702 m)  Wt 338 lb (153.316 kg)  BMI 52.93 kg/m2  SpO2 91%  Review of Systems She denies hypoglycemia and weight change    Objective:   Physical Exam Pulses: dorsalis pedis intact bilat.  Feet: no deformity. feet are of normal color and temp. 1+ bilat leg edema.  Skin: no ulcer on the feet. Lichen planus is noted.  Neuro: sensation is intact to touch on the feet, but decreased from normal.   Lab Results  Component Value Date   HGBA1C 6.8* 08/02/2013      Assessment & Plan:  DM: overcontrolled, given this regimen, which does match insulin to her changing needs throughout the day. Sarcoidosis: improved: this improvement, and the resultant lack of need for steroids, is helping her DM control.   Low functional level: in this setting, she should d/c insulin if she can--i'll  try.     Patient is advised the following: Patient Instructions  check your blood sugar once a day.  vary the time of day when you check, between before the 3 meals, and at bedtime.  also check if you have symptoms of your blood sugar being too high or too low.  please keep a record of the readings and bring it to your next appointment here.  please call us sooner if your blood sugar goes below 70, or if it stays over 200.   Please come back for a follow-up appointment in 6 weeks.  blood tests are being requested for you today.  We'll contact you with results.  Maybe you can get off insulin.

## 2013-08-07 ENCOUNTER — Other Ambulatory Visit: Payer: Self-pay | Admitting: Internal Medicine

## 2013-08-12 DIAGNOSIS — M1710 Unilateral primary osteoarthritis, unspecified knee: Secondary | ICD-10-CM | POA: Diagnosis not present

## 2013-08-12 DIAGNOSIS — M25519 Pain in unspecified shoulder: Secondary | ICD-10-CM | POA: Diagnosis not present

## 2013-08-12 DIAGNOSIS — M25569 Pain in unspecified knee: Secondary | ICD-10-CM | POA: Diagnosis not present

## 2013-08-17 ENCOUNTER — Ambulatory Visit: Payer: Medicare Other | Admitting: Pulmonary Disease

## 2013-08-17 DIAGNOSIS — Z79899 Other long term (current) drug therapy: Secondary | ICD-10-CM | POA: Diagnosis not present

## 2013-08-17 DIAGNOSIS — M5137 Other intervertebral disc degeneration, lumbosacral region: Secondary | ICD-10-CM | POA: Diagnosis not present

## 2013-08-17 DIAGNOSIS — G894 Chronic pain syndrome: Secondary | ICD-10-CM | POA: Diagnosis not present

## 2013-08-17 DIAGNOSIS — M47817 Spondylosis without myelopathy or radiculopathy, lumbosacral region: Secondary | ICD-10-CM | POA: Diagnosis not present

## 2013-08-17 DIAGNOSIS — M1990 Unspecified osteoarthritis, unspecified site: Secondary | ICD-10-CM | POA: Diagnosis not present

## 2013-08-19 DIAGNOSIS — M1710 Unilateral primary osteoarthritis, unspecified knee: Secondary | ICD-10-CM | POA: Diagnosis not present

## 2013-08-23 ENCOUNTER — Encounter: Payer: Self-pay | Admitting: Internal Medicine

## 2013-08-23 ENCOUNTER — Ambulatory Visit (INDEPENDENT_AMBULATORY_CARE_PROVIDER_SITE_OTHER): Payer: Medicare Other | Admitting: Internal Medicine

## 2013-08-23 VITALS — BP 149/89 | HR 76 | Temp 97.4°F | Wt 329.0 lb

## 2013-08-23 DIAGNOSIS — F3289 Other specified depressive episodes: Secondary | ICD-10-CM

## 2013-08-23 DIAGNOSIS — E538 Deficiency of other specified B group vitamins: Secondary | ICD-10-CM

## 2013-08-23 DIAGNOSIS — I10 Essential (primary) hypertension: Secondary | ICD-10-CM | POA: Diagnosis not present

## 2013-08-23 DIAGNOSIS — E1165 Type 2 diabetes mellitus with hyperglycemia: Secondary | ICD-10-CM

## 2013-08-23 DIAGNOSIS — R21 Rash and other nonspecific skin eruption: Secondary | ICD-10-CM | POA: Diagnosis not present

## 2013-08-23 DIAGNOSIS — M25569 Pain in unspecified knee: Secondary | ICD-10-CM | POA: Diagnosis not present

## 2013-08-23 DIAGNOSIS — I2510 Atherosclerotic heart disease of native coronary artery without angina pectoris: Secondary | ICD-10-CM | POA: Diagnosis not present

## 2013-08-23 DIAGNOSIS — E559 Vitamin D deficiency, unspecified: Secondary | ICD-10-CM

## 2013-08-23 DIAGNOSIS — IMO0002 Reserved for concepts with insufficient information to code with codable children: Secondary | ICD-10-CM

## 2013-08-23 DIAGNOSIS — IMO0001 Reserved for inherently not codable concepts without codable children: Secondary | ICD-10-CM

## 2013-08-23 DIAGNOSIS — F329 Major depressive disorder, single episode, unspecified: Secondary | ICD-10-CM

## 2013-08-23 MED ORDER — OXYCODONE HCL 15 MG PO TABS
15.0000 mg | ORAL_TABLET | Freq: Four times a day (QID) | ORAL | Status: DC | PRN
Start: 2013-08-23 — End: 2014-10-06

## 2013-08-23 NOTE — Assessment & Plan Note (Signed)
Dr Estanislado Pandy - s/p recent evaluation

## 2013-08-23 NOTE — Progress Notes (Signed)
Patient ID: [name], female   DOB: 1960-04-14, 53 y.o.   MRN: [id number]   Subjective:    HPI  Pt is s/p a new Rhematology eval by Dr Estanislado Pandy and a Pain Clinic eval by Dr Vira Blanco - she had a radiofrequency treatment on the left  F/u not being able to walk right - "I don't know which foot to put first"  x 4 months. F/u memory issues  Rash is worse since Dr Charlestine Night was tapering off steroids ??Lichen vs sarcoid: no Prenisone since Dec 2014  F/u dizzy spells, weakness, blurred vision since 2 wks ago.  A little better. CBG was ok F/u B L>R knee pain - seeing Ortho Dr Marlou Sa Pt was planning a TKR R in 8/14 - put on hold - trying to loose wt... F/u wt gain  The patient presents for a follow-up of  chronic hypertension, chronic dyslipidemia, type 2 diabetes controlled with medicines; possible CAD F/u rash on face F/u sarcoid - Dr Charlestine Night thought it was inactive - tapering off prednisone - now is on 10 mg/d  C/o R knee pain -- shots helped    Wt Readings from Last 3 Encounters:  08/23/13 329 lb (149.233 kg)  08/02/13 338 lb (153.316 kg)  06/09/13 338 lb (153.316 kg)   BP Readings from Last 3 Encounters:  08/23/13 149/89  08/02/13 118/78  06/09/13 124/60      Review of Systems  Constitutional: Positive for unexpected weight change. Negative for activity change and appetite change.  HENT: Negative for mouth sores and sinus pressure.   Eyes: Negative for visual disturbance.  Respiratory: Negative for chest tightness.   Genitourinary: Negative for difficulty urinating and vaginal pain.  Musculoskeletal: Negative for back pain and gait problem.  Skin: Negative for pallor.  Neurological: Negative for dizziness, tremors and weakness.  Psychiatric/Behavioral: Positive for sleep disturbance. Negative for suicidal ideas, hallucinations, confusion and agitation. The patient is nervous/anxious.         Objective:   Physical Exam  Constitutional: No distress.  Obese   Chronically ill appearing  HENT:  Head: Normocephalic.  Right Ear: External ear normal.  Left Ear: External ear normal.  Nose: Nose normal.  Mouth/Throat: No oropharyngeal exudate.  Caries; missing front teeth  Eyes: Conjunctivae are normal. Pupils are equal, round, and reactive to light. Right eye exhibits no discharge. Left eye exhibits no discharge.  Neck: Normal range of motion. Neck supple. No JVD present. No tracheal deviation present. No thyromegaly present.  Cardiovascular: Normal rate, regular rhythm and normal heart sounds.   Pulmonary/Chest: No stridor. No respiratory distress. She has no wheezes. She exhibits tenderness (L lat).  Abdominal: Soft. Bowel sounds are normal. She exhibits no distension and no mass. There is no tenderness. There is no rebound and no guarding.  Musculoskeletal: She exhibits no edema and no tenderness.  Lymphadenopathy:    She has no cervical adenopathy.  Neurological: She displays normal reflexes. No cranial nerve deficit. She exhibits normal muscle tone. Coordination normal.  Skin: No rash noted. No erythema.  Psychiatric: She has a normal mood and affect. Her behavior is normal. Judgment and thought content normal.  SD on face Sarcoid or lychen lesions on L thigh, L arm, face, wrists R knee is less tender  L lower lateral ribs are tender to palp Cane      Lab Results  Component Value Date   WBC 5.9 06/04/2013   HGB 13.0 06/04/2013   HCT 40.9 06/04/2013   PLT  265 06/04/2013   CHOL 193 06/29/2008   TRIG 145.0 06/29/2008   HDL 67.50 06/29/2008   ALT 16 06/04/2013   AST 14 06/04/2013   NA 138 06/04/2013   K 3.8 06/04/2013   CL 103 04/27/2013   CREATININE 0.9 06/04/2013   BUN 9.9 06/04/2013   CO2 20* 06/04/2013   TSH 2.08 04/27/2013   INR 1.01 10/01/2010   HGBA1C 6.8* 08/02/2013   MICROALBUR 0.5 03/03/2012   Chest CT 2015 LS, C-spine, B hips X rays reviewed  Assessment & Plan:    A complex case - labs, notes reviewed A form for [facility] filled  out

## 2013-08-23 NOTE — Assessment & Plan Note (Signed)
Lichen planus (vs sarcoid) Worse off Prednisone F/up w/Dermatology

## 2013-08-23 NOTE — Assessment & Plan Note (Signed)
Continue with current prescription therapy as reflected on the Med list.  

## 2013-08-23 NOTE — Assessment & Plan Note (Signed)
Better off steroids Continue with current prescription therapy as reflected on the Med list. F/u w/Dr Loanne Drilling

## 2013-08-23 NOTE — Assessment & Plan Note (Signed)
Continue with current prescription therapy as reflected on the Med list. BP Readings from Last 3 Encounters:  08/23/13 149/89  08/02/13 118/78  06/09/13 124/60

## 2013-08-23 NOTE — Progress Notes (Signed)
Pre visit review using our clinic review tool, if applicable. No additional management support is needed unless otherwise documented below in the visit note. 

## 2013-08-24 ENCOUNTER — Telehealth: Payer: Self-pay | Admitting: Internal Medicine

## 2013-08-24 NOTE — Telephone Encounter (Signed)
Relevant patient education mailed to patient.  

## 2013-08-25 DIAGNOSIS — L439 Lichen planus, unspecified: Secondary | ICD-10-CM | POA: Diagnosis not present

## 2013-08-25 DIAGNOSIS — T148 Other injury of unspecified body region: Secondary | ICD-10-CM | POA: Diagnosis not present

## 2013-08-25 DIAGNOSIS — L819 Disorder of pigmentation, unspecified: Secondary | ICD-10-CM | POA: Diagnosis not present

## 2013-08-27 ENCOUNTER — Encounter: Payer: Self-pay | Admitting: Pulmonary Disease

## 2013-08-27 ENCOUNTER — Ambulatory Visit (INDEPENDENT_AMBULATORY_CARE_PROVIDER_SITE_OTHER): Payer: Medicare Other | Admitting: Pulmonary Disease

## 2013-08-27 VITALS — BP 110/62 | HR 67 | Temp 98.7°F | Ht 67.0 in | Wt 326.6 lb

## 2013-08-27 DIAGNOSIS — R918 Other nonspecific abnormal finding of lung field: Secondary | ICD-10-CM | POA: Diagnosis not present

## 2013-08-27 DIAGNOSIS — I2510 Atherosclerotic heart disease of native coronary artery without angina pectoris: Secondary | ICD-10-CM | POA: Diagnosis not present

## 2013-08-27 DIAGNOSIS — R0602 Shortness of breath: Secondary | ICD-10-CM

## 2013-08-27 DIAGNOSIS — D869 Sarcoidosis, unspecified: Secondary | ICD-10-CM | POA: Diagnosis not present

## 2013-08-27 NOTE — Assessment & Plan Note (Signed)
The patient has known sarcoidosis by skin biopsy, but very little pulmonary involvement by x-ray. She appears to be stable from the last visit, and will continue to follow her closely. Will check PFTs at her next visit.

## 2013-08-27 NOTE — Patient Instructions (Signed)
Continue working on weight loss.  You have done well since last visit. Will get your rheumatology records from Dr. Arlean Hopping office for review. Will see you back in 75mos, and will check breathing tests on the same day to review with you.

## 2013-08-27 NOTE — Progress Notes (Signed)
   Subjective:    Patient ID: [name], female    DOB: [birth year], 53 y.o.   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 She has been fairly stable since the last visit, and has actually lost 18 pounds. I have commended her for this. She feels that her breathing is about the same to a little better, and denies any sleep significant cough or congestion. She is due in the near future for followup PFTs and CT.   Review of Systems  Constitutional: Negative for fever and unexpected weight change.  HENT: Negative for congestion, dental problem, ear pain, nosebleeds, postnasal drip, rhinorrhea, sinus pressure, sneezing, sore throat and trouble swallowing.   Eyes: Negative for redness and itching.  Respiratory: Positive for cough and shortness of breath. Negative for chest tightness and wheezing.   Cardiovascular: Negative for palpitations and leg swelling.  Gastrointestinal: Negative for nausea and vomiting.  Genitourinary: Negative for dysuria.  Musculoskeletal: Negative for joint swelling.  Skin: Negative for rash.  Neurological: Negative for headaches.  Hematological: Does not bruise/bleed easily.  Psychiatric/Behavioral: Negative for dysphoric mood. The patient is not nervous/anxious.        Objective:   Physical Exam Morbidly obese female in no acute distress Nose without purulence or discharge noted Neck without lymphadenopathy or thyromegaly Chest totally clear to auscultation, no active wheezing Cardiac exam with regular rate and rhythm Lower extremities with 2+ edema, no cyanosis Alert and oriented, moves all 4 extremities       Assessment & Plan:

## 2013-08-27 NOTE — Assessment & Plan Note (Signed)
The patient has dyspnea on exertion that I suspect is related more to her obesity and deconditioning than her sarcoid.

## 2013-08-27 NOTE — Assessment & Plan Note (Signed)
The patient has a history of pulmonary nodules that may be related to her sarcoid, however she now tells me that her rheumatologist thinks that she may have rheumatoid arthritis. If this is the case, she may simply have rheumatoid nodules. She will need a followup CT next year, and this can be scheduled at her return visit.

## 2013-08-31 DIAGNOSIS — M47817 Spondylosis without myelopathy or radiculopathy, lumbosacral region: Secondary | ICD-10-CM | POA: Diagnosis not present

## 2013-09-08 ENCOUNTER — Other Ambulatory Visit: Payer: Self-pay | Admitting: Endocrinology

## 2013-09-13 ENCOUNTER — Other Ambulatory Visit: Payer: Self-pay | Admitting: Internal Medicine

## 2013-09-13 ENCOUNTER — Ambulatory Visit (INDEPENDENT_AMBULATORY_CARE_PROVIDER_SITE_OTHER): Payer: Medicare Other | Admitting: Endocrinology

## 2013-09-13 ENCOUNTER — Encounter: Payer: Self-pay | Admitting: Endocrinology

## 2013-09-13 VITALS — BP 118/64 | HR 90 | Temp 97.3°F | Ht 67.0 in | Wt 332.0 lb

## 2013-09-13 DIAGNOSIS — I2510 Atherosclerotic heart disease of native coronary artery without angina pectoris: Secondary | ICD-10-CM

## 2013-09-13 DIAGNOSIS — L97929 Non-pressure chronic ulcer of unspecified part of left lower leg with unspecified severity: Secondary | ICD-10-CM | POA: Insufficient documentation

## 2013-09-13 DIAGNOSIS — L97909 Non-pressure chronic ulcer of unspecified part of unspecified lower leg with unspecified severity: Secondary | ICD-10-CM | POA: Diagnosis not present

## 2013-09-13 DIAGNOSIS — L97921 Non-pressure chronic ulcer of unspecified part of left lower leg limited to breakdown of skin: Secondary | ICD-10-CM

## 2013-09-13 MED ORDER — METFORMIN HCL ER 500 MG PO TB24: ORAL_TABLET | ORAL | Status: DC

## 2013-09-13 NOTE — Progress Notes (Signed)
Subjective:    Patient ID: [name], female    DOB: May 31, 1960, 53 y.o.   MRN: [id number]  HPI Pt reutrns for f/u of IDDM (dx'ed 2013, when she presented with severe hyperglycemia; complicated by sensory neuropathy of the lower extremities; she has been on insulin since dx; pt needs a simple regimen, due to low functional level; she has never had pancreatitis, severe hypoglycemia, or DKA; she cannot undergo weight-loss surgery, as she has medicaid).  She wants to see if she can get off insulin.  She is still off prednisone (she was on it for sarcoidosis).  no cbg record, but states cbg's are well-controlled.  She denies hypoglycemia.   Pt states 8 mos of slight ulcer at the left leg, and assoc pain. Past Medical History  Diagnosis Date  . Abdominal pain     rlq  . Urinary tract infection, site not specified   . Sinusitis   . Osteoarthritis   . Sarcoidosis     Dr. Lolita Patella  . Esophageal reflux   . Depression   . Anxiety   . Vitamin D deficiency 2009  . Vitamin B 12 deficiency 2009  . Ruptured appendix   . Hypertension   . OSA (obstructive sleep apnea)   . Allergic rhinitis   . ABDOMINAL PAIN RIGHT LOWER QUADRANT 02/07/2007  . ALLERGIC RHINITIS 10/26/2009  . ANXIETY 08/14/2006  . B12 DEFICIENCY 08/25/2007  . DEPRESSION 08/14/2006  . DYSPNEA 04/28/2009  . ELEVATED BP 07/10/2007  . GRIEF REACTION 08/01/2009  . HYPERGLYCEMIA 12/30/2008  . HYPERTENSION 08/27/2007  . INSOMNIA, CHRONIC 03/07/2010  . NAUSEA 03/07/2010  . OBESITY 03/07/2010  . VISUAL CHANGES 07/02/2007  . VITAMIN D DEFICIENCY 08/25/2007  . Shortness of breath   . Family history of breast cancer     sister    Past Surgical History  Procedure Laterality Date  . Arthroscopic knee surgery  11-12-04    L  . Appendectomy    . Endometrial ablation    . Axillary abcess irrigation and debridement  Jul & Aug2012    History   Social History  . Marital Status: Single    Spouse Name: N/A    Number of Children:  N/A  . Years of Education: N/A   Occupational History  . disabled    Social History Main Topics  . Smoking status: Former Smoker -- 0.50 packs/day for 10 years    Types: Cigarettes    Quit date: 01/14/2002  . Smokeless tobacco: Not on file  . Alcohol Use: No  . Drug Use: No  . Sexual Activity: Not Currently   Other Topics Concern  . Not on file   Social History Narrative   Single, broke up with partner in 2008.    Current Outpatient Prescriptions on File Prior to Visit  Medication Sig Dispense Refill  . carvedilol (COREG) 12.5 MG tablet TAKE 1 TABLET BY MOUTH TWICE DAILY WITH MEALS  180 tablet  3  . Diclofenac Potassium (ZIPSOR) 25 MG CAPS Take 25 mg by mouth. Take 2 tabs BID as needed      . escitalopram (LEXAPRO) 20 MG tablet TAKE 1 TABLET BY MOUTH DAILY  30 tablet  5  . FREESTYLE LITE test strip TEST TWICE DAILY  100 each  3  . Lancets (FREESTYLE) lancets 1 each by Other route 2 (two) times daily. Use as instructed  100 each  12  . NEXIUM 40 MG capsule TAKE 1 CAPSULE BY MOUTH EVERY DAY BEFORE BREAKFAST  90 capsule  2  . nortriptyline (PAMELOR) 25 MG capsule TAKE 1 TO 2 CAPSULES BY MOUTH EVERY NIGHT AT BEDTIME AS NEEDED FOR INSOMINIA  180 capsule  0  . oxyCODONE (ROXICODONE) 15 MG immediate release tablet Take 1 tablet (15 mg total) by mouth every 6 (six) hours as needed for pain.  120 tablet  0  . topiramate (TOPAMAX) 50 MG tablet TAKE 1 TABLET BY MOUTH TWICE DAILY  60 tablet  0  . meclizine (ANTIVERT) 12.5 MG tablet Take 1 tablet (12.5 mg total) by mouth 3 (three) times daily as needed for dizziness or nausea.  60 tablet  1  . [DISCONTINUED] potassium chloride (K-DUR,KLOR-CON) 10 MEQ tablet TAKE 1 TABLET BY MOUTH DAILY  90 tablet  2   No current facility-administered medications on file prior to visit.    Allergies  Allergen Reactions  . Enalapril Maleate     REACTION: cough  . Hydrochlorothiazide     REACTION: Low potassium  . Hydroxychloroquine Sulfate      REACTION: vision changes  . Iodine     Mother was intolerant--CODED on CT table---pt never tired.  . Latex     Hives    Family History  Problem Relation Age of Onset  . Hypertension    . Coronary artery disease      female 1st degree relative <60  . Heart failure      congestive  . Coronary artery disease      Female 1st degree relative <50  . Breast cancer      1st degree relative <50 S  . Heart disease Mother   . Kidney disease Mother   . Cancer Father     leukemia    BP 118/64  Pulse 90  Temp(Src) 97.3 F (36.3 C) (Oral)  Ht 5\' 7"  (1.702 m)  Wt 332 lb (150.594 kg)  BMI 51.99 kg/m2  SpO2 95%  Review of Systems Pt has lost a few lbs.  She has left heel pain.     Objective:   Physical Exam VITAL SIGNS:  See vs page GENERAL: no distress Pulses: dorsalis pedis intact bilat.  Feet: no deformity. feet are of normal color and temp. 1+ bilat leg edema.  There is a 3 cm ulcer at the left anterior tibial area.  The left heel is normal.   Skin: no ulcer on the feet. Lichen planus is noted.  Neuro: sensation is intact to touch on the feet, but decreased from normal.       Assessment & Plan:  Leg ulcer, new Heel pain, mod exacerbation, uncertain etiology DM: well-controlled.  She can prob be managed on oral meds now.     Patient is advised the following: Patient Instructions  check your blood sugar once a day.  vary the time of day when you check, between before the 3 meals, and at bedtime.  also check if you have symptoms of your blood sugar being too high or too low.  please keep a record of the readings and bring it to your next appointment here.  please call us sooner if your blood sugar goes below 70, or if it stays over 200.   Please come back for a follow-up appointment in 1 month.  Please stop the insulin altogether.  i have sent a prescription to your pharmacy, to resume the metformin.    We can revisit the heel pain if it persists.

## 2013-09-13 NOTE — Patient Instructions (Addendum)
check your blood sugar once a day.  vary the time of day when you check, between before the 3 meals, and at bedtime.  also check if you have symptoms of your blood sugar being too high or too low.  please keep a record of the readings and bring it to your next appointment here.  please call us sooner if your blood sugar goes below 70, or if it stays over 200.   Please come back for a follow-up appointment in 1 month.  Please stop the insulin altogether.  i have sent a prescription to your pharmacy, to resume the metformin.    We can revisit the heel pain if it persists.

## 2013-09-15 DIAGNOSIS — M1710 Unilateral primary osteoarthritis, unspecified knee: Secondary | ICD-10-CM | POA: Diagnosis not present

## 2013-09-16 ENCOUNTER — Telehealth: Payer: Self-pay | Admitting: Endocrinology

## 2013-09-16 NOTE — Telephone Encounter (Signed)
Patient states that Dr. Loanne Drilling was referring her to wound care The blister she has on her leg bursted and now is leaking   Please advise patient    Thank you

## 2013-09-17 NOTE — Telephone Encounter (Signed)
Pt advised to keep wound covered with antibiotic ointment and a bandaid. Pt st states that she would.

## 2013-09-17 NOTE — Telephone Encounter (Signed)
See below, [facility] is still working on referral to wound care. Thanks!

## 2013-09-17 NOTE — Telephone Encounter (Signed)
Ok, please keep it covered with antibiotic ointment and a bandaid, until the wound-care appt

## 2013-09-21 DIAGNOSIS — Z79899 Other long term (current) drug therapy: Secondary | ICD-10-CM | POA: Diagnosis not present

## 2013-09-21 DIAGNOSIS — M1990 Unspecified osteoarthritis, unspecified site: Secondary | ICD-10-CM | POA: Diagnosis not present

## 2013-09-21 DIAGNOSIS — G894 Chronic pain syndrome: Secondary | ICD-10-CM | POA: Diagnosis not present

## 2013-09-21 DIAGNOSIS — M5137 Other intervertebral disc degeneration, lumbosacral region: Secondary | ICD-10-CM | POA: Diagnosis not present

## 2013-09-22 ENCOUNTER — Telehealth: Payer: Self-pay | Admitting: Endocrinology

## 2013-09-22 NOTE — Telephone Encounter (Signed)
Called pt and advised of appointment with wound care. She states that she would not be able to come into office for evaluation today or tomorrow, but she could come Friday. Pt scheduled for 9/11 at 2 pm

## 2013-09-22 NOTE — Telephone Encounter (Addendum)
Please see below. Contacted [facility], pt is scheduled for October 1 st at 9:15 am.  Please advise, Thanks!

## 2013-09-22 NOTE — Telephone Encounter (Signed)
It has been more than a week, so I need to check it again.  Ov today or tomorrow

## 2013-09-22 NOTE — Telephone Encounter (Signed)
Patient stated that Dr. Loanne Drilling was suppose to referrer her to the wound center and no one has called her to schedule an appointment.  She said she was in a lot of pain, and her wound is infected and leaking. In the mean time could Dr Loanne Drilling call her in some antibiotics.  Please advise

## 2013-09-24 ENCOUNTER — Encounter: Payer: Self-pay | Admitting: Endocrinology

## 2013-09-24 ENCOUNTER — Ambulatory Visit (INDEPENDENT_AMBULATORY_CARE_PROVIDER_SITE_OTHER): Payer: Medicare Other | Admitting: Endocrinology

## 2013-09-24 VITALS — BP 118/64 | HR 88 | Temp 97.9°F | Ht 67.0 in

## 2013-09-24 DIAGNOSIS — L97909 Non-pressure chronic ulcer of unspecified part of unspecified lower leg with unspecified severity: Secondary | ICD-10-CM | POA: Diagnosis not present

## 2013-09-24 DIAGNOSIS — I2510 Atherosclerotic heart disease of native coronary artery without angina pectoris: Secondary | ICD-10-CM

## 2013-09-24 DIAGNOSIS — Z23 Encounter for immunization: Secondary | ICD-10-CM | POA: Diagnosis not present

## 2013-09-24 DIAGNOSIS — L97921 Non-pressure chronic ulcer of unspecified part of left lower leg limited to breakdown of skin: Secondary | ICD-10-CM

## 2013-09-24 MED ORDER — DOXYCYCLINE HYCLATE 100 MG PO TABS: 100.0000 mg | ORAL_TABLET | Freq: Two times a day (BID) | ORAL | Status: DC

## 2013-09-24 NOTE — Progress Notes (Signed)
Subjective:    Patient ID: [name], female    DOB: 06/08/1960, 53 y.o.   MRN: [id number]  HPI Pt reutrns for f/u of IDDM (dx'ed 2013, when she presented with severe hyperglycemia; complicated by sensory neuropathy of the lower extremities; she has been on insulin from 2013-2015; pt needs a simple regimen, due to low functional level; she has never had pancreatitis, severe hypoglycemia, or DKA; she cannot undergo weight-loss surgery, as she has medicaid).  She is still off prednisone (she was on it for sarcoidosis).  no cbg record, but states cbg's vary from 104-136.  She says metformin is causing nausea and diarrhea. Pt states 8 mos of slight ulcer at the left leg, and assoc pain.  She says it is worse.  Past Medical History  Diagnosis Date  . Abdominal pain     rlq  . Urinary tract infection, site not specified   . Sinusitis   . Osteoarthritis   . Sarcoidosis     Dr. Lolita Patella  . Esophageal reflux   . Depression   . Anxiety   . Vitamin D deficiency 2009  . Vitamin B 12 deficiency 2009  . Ruptured appendix   . Hypertension   . OSA (obstructive sleep apnea)   . Allergic rhinitis   . ABDOMINAL PAIN RIGHT LOWER QUADRANT 02/07/2007  . ALLERGIC RHINITIS 10/26/2009  . ANXIETY 08/14/2006  . B12 DEFICIENCY 08/25/2007  . DEPRESSION 08/14/2006  . DYSPNEA 04/28/2009  . ELEVATED BP 07/10/2007  . GRIEF REACTION 08/01/2009  . HYPERGLYCEMIA 12/30/2008  . HYPERTENSION 08/27/2007  . INSOMNIA, CHRONIC 03/07/2010  . NAUSEA 03/07/2010  . OBESITY 03/07/2010  . VISUAL CHANGES 07/02/2007  . VITAMIN D DEFICIENCY 08/25/2007  . Shortness of breath   . Family history of breast cancer     sister    Past Surgical History  Procedure Laterality Date  . Arthroscopic knee surgery  11-12-04    L  . Appendectomy    . Endometrial ablation    . Axillary abcess irrigation and debridement  Jul & Aug2012    History   Social History  . Marital Status: Single    Spouse Name: N/A    Number of  Children: N/A  . Years of Education: N/A   Occupational History  . disabled    Social History Main Topics  . Smoking status: Former Smoker -- 0.50 packs/day for 10 years    Types: Cigarettes    Quit date: 01/14/2002  . Smokeless tobacco: Not on file  . Alcohol Use: No  . Drug Use: No  . Sexual Activity: Not Currently   Other Topics Concern  . Not on file   Social History Narrative   Single, broke up with partner in 2008.    Current Outpatient Prescriptions on File Prior to Visit  Medication Sig Dispense Refill  . carvedilol (COREG) 12.5 MG tablet TAKE 1 TABLET BY MOUTH TWICE DAILY WITH MEALS  180 tablet  3  . Diclofenac Potassium (ZIPSOR) 25 MG CAPS Take 25 mg by mouth. Take 2 tabs BID as needed      . escitalopram (LEXAPRO) 20 MG tablet TAKE 1 TABLET BY MOUTH DAILY  30 tablet  5  . FREESTYLE LITE test strip TEST TWICE DAILY  100 each  3  . Lancets (FREESTYLE) lancets 1 each by Other route 2 (two) times daily. Use as instructed  100 each  12  . NEXIUM 40 MG capsule TAKE 1 CAPSULE BY MOUTH EVERY DAY BEFORE BREAKFAST  90 capsule  2  . nortriptyline (PAMELOR) 25 MG capsule TAKE 1 TO 2 CAPSULES BY MOUTH EVERY NIGHT AT BEDTIME AS NEEDED FOR INSOMINIA  180 capsule  0  . oxyCODONE (ROXICODONE) 15 MG immediate release tablet Take 1 tablet (15 mg total) by mouth every 6 (six) hours as needed for pain.  120 tablet  0  . potassium chloride (K-DUR) 10 MEQ tablet TAKE 1 TABLET BY MOUTH DAILY  90 tablet  3  . topiramate (TOPAMAX) 50 MG tablet TAKE 1 TABLET BY MOUTH TWICE DAILY  60 tablet  0  . meclizine (ANTIVERT) 12.5 MG tablet Take 1 tablet (12.5 mg total) by mouth 3 (three) times daily as needed for dizziness or nausea.  60 tablet  1  . [DISCONTINUED] potassium chloride (K-DUR,KLOR-CON) 10 MEQ tablet TAKE 1 TABLET BY MOUTH DAILY  90 tablet  2   No current facility-administered medications on file prior to visit.    Allergies  Allergen Reactions  . Enalapril Maleate     REACTION: cough   . Hydrochlorothiazide     REACTION: Low potassium  . Hydroxychloroquine Sulfate     REACTION: vision changes  . Iodine     Mother was intolerant--CODED on CT table---pt never tired.  . Latex     Hives    Family History  Problem Relation Age of Onset  . Hypertension    . Coronary artery disease      female 1st degree relative <60  . Heart failure      congestive  . Coronary artery disease      Female 1st degree relative <50  . Breast cancer      1st degree relative <50 S  . Heart disease Mother   . Kidney disease Mother   . Cancer Father     leukemia    BP 118/64  Pulse 88  Temp(Src) 97.9 F (36.6 C) (Oral)  Ht 5\' 7"  (1.702 m)   Review of Systems Denies fever, itching, and sob.    Objective:   Physical Exam VITAL SIGNS:  See vs page. GENERAL: no distress.  Morbid obesity Left leg: 2+ edema.  3 cm shallow ulcer at the anterior tibial area, with few cm surrounding erythema.  No drainage, purulent or otherwise.    Lab Results  Component Value Date   HGBA1C 6.8* 08/02/2013      Assessment & Plan:  Leg ulcer, mild exacerbation DM: well-controlled Nausea, new, prob due to metformin  Patient is advised the following: Patient Instructions  You will get better much faster if you elevate your left leg above the rest of your body.   i have sent a prescription to your pharmacy, for an antibiotic pill.   Keep the ulcer covered with antibiotic ointment and a large bandaid (change it daily).   Please see the wound-care center as scheduled.  please call sooner if the ulcer gets worse.   Please reduce the metformin to 1 pill daily.   check your blood sugar once a day.  vary the time of day when you check, between before the 3 meals, and at bedtime.  also check if you have symptoms of your blood sugar being too high or too low.  please keep a record of the readings and bring it to your next appointment here.  You can write it on any piece of paper.  please call us sooner if  your blood sugar goes below 70, or if you have a lot of readings over  200.       

## 2013-09-24 NOTE — Patient Instructions (Addendum)
You will get better much faster if you elevate your left leg above the rest of your body.   i have sent a prescription to your pharmacy, for an antibiotic pill.   Keep the ulcer covered with antibiotic ointment and a large bandaid (change it daily).   Please see the wound-care center as scheduled.  please call sooner if the ulcer gets worse.   Please reduce the metformin to 1 pill daily.   check your blood sugar once a day.  vary the time of day when you check, between before the 3 meals, and at bedtime.  also check if you have symptoms of your blood sugar being too high or too low.  please keep a record of the readings and bring it to your next appointment here.  You can write it on any piece of paper.  please call us sooner if your blood sugar goes below 70, or if you have a lot of readings over 200.

## 2013-10-04 ENCOUNTER — Telehealth: Payer: Self-pay | Admitting: Internal Medicine

## 2013-10-04 NOTE — Telephone Encounter (Signed)
Patient seen Dr. Alain Marion in August.  Dr.  Camila Li referred her to a home health care to take care of her cleaning at home. They have contacted her stating they needed a date of the last visit with Dr. Camila Li.  Please advise.

## 2013-10-05 ENCOUNTER — Telehealth: Payer: Self-pay | Admitting: *Deleted

## 2013-10-05 NOTE — Telephone Encounter (Signed)
VM left for pt about home health agency and last visit with Dr. Alain Marion. No number was found for [facility] agency at this time

## 2013-10-07 DIAGNOSIS — Z0279 Encounter for issue of other medical certificate: Secondary | ICD-10-CM

## 2013-10-13 ENCOUNTER — Ambulatory Visit: Payer: Medicare Other | Admitting: Endocrinology

## 2013-10-14 ENCOUNTER — Encounter (HOSPITAL_BASED_OUTPATIENT_CLINIC_OR_DEPARTMENT_OTHER): Payer: Medicare Other | Attending: Internal Medicine

## 2013-10-14 DIAGNOSIS — L97221 Non-pressure chronic ulcer of left calf limited to breakdown of skin: Secondary | ICD-10-CM | POA: Insufficient documentation

## 2013-10-14 DIAGNOSIS — I87331 Chronic venous hypertension (idiopathic) with ulcer and inflammation of right lower extremity: Secondary | ICD-10-CM | POA: Insufficient documentation

## 2013-10-14 DIAGNOSIS — E11622 Type 2 diabetes mellitus with other skin ulcer: Secondary | ICD-10-CM | POA: Insufficient documentation

## 2013-10-14 DIAGNOSIS — L97211 Non-pressure chronic ulcer of right calf limited to breakdown of skin: Secondary | ICD-10-CM | POA: Insufficient documentation

## 2013-10-16 ENCOUNTER — Other Ambulatory Visit: Payer: Self-pay | Admitting: Internal Medicine

## 2013-11-03 DIAGNOSIS — E1342 Other specified diabetes mellitus with diabetic polyneuropathy: Secondary | ICD-10-CM | POA: Diagnosis not present

## 2013-11-03 DIAGNOSIS — Z79899 Other long term (current) drug therapy: Secondary | ICD-10-CM | POA: Diagnosis not present

## 2013-11-03 DIAGNOSIS — M791 Myalgia: Secondary | ICD-10-CM | POA: Diagnosis not present

## 2013-11-03 DIAGNOSIS — M5137 Other intervertebral disc degeneration, lumbosacral region: Secondary | ICD-10-CM | POA: Diagnosis not present

## 2013-11-03 DIAGNOSIS — M179 Osteoarthritis of knee, unspecified: Secondary | ICD-10-CM | POA: Diagnosis not present

## 2013-11-03 DIAGNOSIS — G894 Chronic pain syndrome: Secondary | ICD-10-CM | POA: Diagnosis not present

## 2013-11-03 DIAGNOSIS — M47817 Spondylosis without myelopathy or radiculopathy, lumbosacral region: Secondary | ICD-10-CM | POA: Diagnosis not present

## 2013-11-03 DIAGNOSIS — M5408 Panniculitis affecting regions of neck and back, sacral and sacrococcygeal region: Secondary | ICD-10-CM | POA: Diagnosis not present

## 2013-11-04 DIAGNOSIS — I87331 Chronic venous hypertension (idiopathic) with ulcer and inflammation of right lower extremity: Secondary | ICD-10-CM | POA: Diagnosis not present

## 2013-11-04 DIAGNOSIS — E11622 Type 2 diabetes mellitus with other skin ulcer: Secondary | ICD-10-CM | POA: Diagnosis not present

## 2013-11-04 DIAGNOSIS — L97211 Non-pressure chronic ulcer of right calf limited to breakdown of skin: Secondary | ICD-10-CM | POA: Diagnosis not present

## 2013-11-04 DIAGNOSIS — L97221 Non-pressure chronic ulcer of left calf limited to breakdown of skin: Secondary | ICD-10-CM | POA: Diagnosis not present

## 2013-11-04 LAB — GLUCOSE, CAPILLARY: GLUCOSE-CAPILLARY: 129 mg/dL — AB (ref 70–99)

## 2013-11-04 NOTE — Progress Notes (Signed)
[facility]  NAME:  [name], [name]             ACCOUNT NO.:  MEDICAL RECORD NO.:  [id number]      DATE OF BIRTH:  [birth year]  PHYSICIAN:  Ricard Dillon, M.D. VISIT DATE:  11/04/2013                                  OFFICE VISIT   LOCATION:  [place].  CHIEF COMPLAINT:  Here for review of wound on the left leg.  HISTORY:  This is a patient who is variably in the records listed as having type 1 or type 2 diabetes, although she has come off insulin and seems to be doing fairly well, therefore she is a type 2.  She tells me for 5 weeks that she noted a bump on the left anterolateral part of her leg in the calf area.  She does not have a history of trauma or particularly skin lesions in the area, although she does have a history of Lichen simplex chronicus and apparently skin sarcoidosis according to the patient.  She has also been on chronic prednisone for sarcoidosis for a period of time although she has stopped this recently.  In any case, ultimately the small raised area on her leg blistered and opened.  She has been dressing this with a bandage and gauze.  There has not been any particular pain.  Interestingly looking back on her record, it appears that the patient has had trouble in this area for perhaps much longer than that.  Specifically, there is a note from Dr. Loanne Drilling on August 31 that states she had a wound on the left leg for 8 months, perhaps this is not the same area.  In any case, this has not gotten any better.  She is having some pain in the area and she is here for review of this wound on her left anterolateral leg.  PAST MEDICAL HISTORY:  Includes 1. Type 2 diabetes, on insulin, however, she is now off insulin on     metformin. 2. Osteoarthritis. 3. Sarcoidosis. 4. Esophageal reflux. 5. Depression with anxiety. 6. Vitamin B12 deficiency. 7. Ruptured appendix. 8. Hypertension. 9. Obstructive sleep  apnea. 10.Allergic rhinitis. 11.Hypertension. 12.Chronic insomnia. 13.Obesity. 14.Vitamin D deficiency.  Reviewing [place] link, I do not see that she has had vascular studies either arterial or venous.  There is some reference in her chart that she has neuropathy.  CURRENT MEDICATIONS: 1. Carvedilol 12.5 b.i.d. 2. Diclofenac 25 b.i.d. p.r.n. 3. Lexapro 20 daily. 4. Nexium 40 daily. 5. Antivert 3 times a day p.r.n. 6. Glucophage XR 500 mg daily. 7. Pamelor 25 daily p.r.n. 8. Roxicodone 50 mg q.6 hours p.r.n. 9. K-Dur were 10 mEq daily. 10.Topamax 50 b.i.d.  EXAMINATION:  Her temperature is 98, pulse 73, respirations 18, blood pressure 104/41.  CBG done in this clinic was 129.  A vascular assessment on the left revealed an ABI of 1.25.  She does have palpable dorsalis pedis pulses.  NEUROLOGIC: She had what seemingly was a normal vibration sense.  RESPIRATORY EXAM: Clear air entry bilaterally. CARDIAC:  Heart sounds normal.  She does not have any murmurs or gallops. Her JVP is not elevated.  EXTREMITIES;  She does have 2 to 3+ pitting edema below the knee.  Wound Exam:  The areas are two, the first on the left  anterior lower extremity measures roughly the size of a quarter.  Surrounding this wound is fairly is erythema with tenderness.  This seems well-demarcated from normal skin.  I am raising the possibility of a superficial infection.  The area on the right leg had a small Band-Aid over it, measured 0.5 x 0.3 x 0.1.  This did not seem to be as major an issue. Both her legs had significant edema, but not evidence of DVT.  IMPRESSIONS/PLAN:  Venous hypertension, inflammation, and ulceration.  I suspect this is probably mostly venous stasis type change.  Both of the wounds were dressed with silver alginate.  I put her on Keflex for the possibility of a superficial skin infection surrounding the wound on the left leg.  The patient will be wrapped with Profore Lite on  both sides. We will see her back in a week's time.          ______________________________ Ricard Dillon, M.D.     MGR/MEDQ  D:  11/04/2013  T:  11/04/2013  Job:  [id number]

## 2013-11-11 DIAGNOSIS — L97221 Non-pressure chronic ulcer of left calf limited to breakdown of skin: Secondary | ICD-10-CM | POA: Diagnosis not present

## 2013-11-11 DIAGNOSIS — I87331 Chronic venous hypertension (idiopathic) with ulcer and inflammation of right lower extremity: Secondary | ICD-10-CM | POA: Diagnosis not present

## 2013-11-11 DIAGNOSIS — E11622 Type 2 diabetes mellitus with other skin ulcer: Secondary | ICD-10-CM | POA: Diagnosis not present

## 2013-11-11 DIAGNOSIS — L97211 Non-pressure chronic ulcer of right calf limited to breakdown of skin: Secondary | ICD-10-CM | POA: Diagnosis not present

## 2013-11-18 ENCOUNTER — Encounter (HOSPITAL_BASED_OUTPATIENT_CLINIC_OR_DEPARTMENT_OTHER): Payer: Medicare Other | Attending: Internal Medicine

## 2013-11-18 DIAGNOSIS — L97929 Non-pressure chronic ulcer of unspecified part of left lower leg with unspecified severity: Secondary | ICD-10-CM | POA: Diagnosis not present

## 2013-11-18 DIAGNOSIS — I87331 Chronic venous hypertension (idiopathic) with ulcer and inflammation of right lower extremity: Secondary | ICD-10-CM | POA: Diagnosis not present

## 2013-11-25 DIAGNOSIS — I87331 Chronic venous hypertension (idiopathic) with ulcer and inflammation of right lower extremity: Secondary | ICD-10-CM | POA: Diagnosis not present

## 2013-11-25 DIAGNOSIS — L97929 Non-pressure chronic ulcer of unspecified part of left lower leg with unspecified severity: Secondary | ICD-10-CM | POA: Diagnosis not present

## 2013-11-25 LAB — GLUCOSE, CAPILLARY: Glucose-Capillary: 122 mg/dL — ABNORMAL HIGH (ref 70–99)

## 2013-11-26 ENCOUNTER — Telehealth: Payer: Self-pay | Admitting: Hematology

## 2013-11-26 ENCOUNTER — Other Ambulatory Visit: Payer: Self-pay

## 2013-11-26 DIAGNOSIS — R778 Other specified abnormalities of plasma proteins: Secondary | ICD-10-CM

## 2013-11-26 NOTE — Telephone Encounter (Signed)
LM to confirm d/t for 11/29/13.

## 2013-11-29 ENCOUNTER — Other Ambulatory Visit: Payer: Medicare Other

## 2013-11-29 ENCOUNTER — Telehealth: Payer: Self-pay | Admitting: *Deleted

## 2013-11-29 ENCOUNTER — Ambulatory Visit: Payer: Medicare Other | Admitting: Hematology

## 2013-11-29 ENCOUNTER — Telehealth: Payer: Self-pay | Admitting: Hematology

## 2013-11-29 NOTE — Telephone Encounter (Signed)
Called pt since she didn't show for an appt today.  She reports that there was no way she could make an afternoon appt.  Left message for Scheduler/Amber to call her back to r/s.

## 2013-11-29 NOTE — Telephone Encounter (Signed)
returned pt call and r/s appt per pt request...pt ok adna ware °

## 2013-12-01 ENCOUNTER — Ambulatory Visit: Payer: Medicare Other | Admitting: Hematology

## 2013-12-06 ENCOUNTER — Ambulatory Visit (HOSPITAL_BASED_OUTPATIENT_CLINIC_OR_DEPARTMENT_OTHER): Payer: Medicare Other | Admitting: Hematology

## 2013-12-06 ENCOUNTER — Other Ambulatory Visit (HOSPITAL_BASED_OUTPATIENT_CLINIC_OR_DEPARTMENT_OTHER): Payer: Medicare Other

## 2013-12-06 ENCOUNTER — Encounter: Payer: Self-pay | Admitting: Hematology

## 2013-12-06 VITALS — BP 125/60 | HR 84 | Temp 98.0°F | Resp 22 | Ht 67.0 in | Wt 311.8 lb

## 2013-12-06 DIAGNOSIS — Z853 Personal history of malignant neoplasm of breast: Secondary | ICD-10-CM | POA: Diagnosis not present

## 2013-12-06 DIAGNOSIS — R799 Abnormal finding of blood chemistry, unspecified: Secondary | ICD-10-CM

## 2013-12-06 DIAGNOSIS — R769 Abnormal immunological finding in serum, unspecified: Secondary | ICD-10-CM | POA: Diagnosis not present

## 2013-12-06 DIAGNOSIS — R7889 Finding of other specified substances, not normally found in blood: Secondary | ICD-10-CM | POA: Diagnosis not present

## 2013-12-06 DIAGNOSIS — R778 Other specified abnormalities of plasma proteins: Secondary | ICD-10-CM

## 2013-12-06 DIAGNOSIS — Z809 Family history of malignant neoplasm, unspecified: Secondary | ICD-10-CM

## 2013-12-06 LAB — COMPREHENSIVE METABOLIC PANEL (CC13)
ALBUMIN: 3 g/dL — AB (ref 3.5–5.0)
ALT: 22 U/L (ref 0–55)
AST: 29 U/L (ref 5–34)
Alkaline Phosphatase: 63 U/L (ref 40–150)
Anion Gap: 10 mEq/L (ref 3–11)
BUN: 11 mg/dL (ref 7.0–26.0)
CO2: 24 mEq/L (ref 22–29)
Calcium: 9.4 mg/dL (ref 8.4–10.4)
Chloride: 106 mEq/L (ref 98–109)
Creatinine: 1.1 mg/dL (ref 0.6–1.1)
Glucose: 110 mg/dl (ref 70–140)
POTASSIUM: 3.7 meq/L (ref 3.5–5.1)
SODIUM: 140 meq/L (ref 136–145)
TOTAL PROTEIN: 6.6 g/dL (ref 6.4–8.3)
Total Bilirubin: 0.34 mg/dL (ref 0.20–1.20)

## 2013-12-06 LAB — CBC WITH DIFFERENTIAL/PLATELET
BASO%: 0.3 % (ref 0.0–2.0)
Basophils Absolute: 0 10*3/uL (ref 0.0–0.1)
EOS ABS: 0.1 10*3/uL (ref 0.0–0.5)
EOS%: 2 % (ref 0.0–7.0)
HCT: 43 % (ref 34.8–46.6)
HGB: 13.4 g/dL (ref 11.6–15.9)
LYMPH#: 1.7 10*3/uL (ref 0.9–3.3)
LYMPH%: 28.8 % (ref 14.0–49.7)
MCH: 28.7 pg (ref 25.1–34.0)
MCHC: 31.2 g/dL — ABNORMAL LOW (ref 31.5–36.0)
MCV: 92.1 fL (ref 79.5–101.0)
MONO#: 0.6 10*3/uL (ref 0.1–0.9)
MONO%: 9.3 % (ref 0.0–14.0)
NEUT#: 3.6 10*3/uL (ref 1.5–6.5)
NEUT%: 59.6 % (ref 38.4–76.8)
Platelets: 216 10*3/uL (ref 145–400)
RBC: 4.67 10*6/uL (ref 3.70–5.45)
RDW: 14.5 % (ref 11.2–14.5)
WBC: 6 10*3/uL (ref 3.9–10.3)

## 2013-12-06 NOTE — Progress Notes (Signed)
[place] [place] [phone]   [phone]  NEW PATIENT EVALUATION   Name: [name] Date: 12/06/2013 MRN: [id number] DOB: [birth year]  PCP: Walker Kehr, MD   REFERRING PHYSICIAN: Plotnikov, Evie Lacks, MD     REASON FOR VISIT: Abnormal SPEP/IFE  INTERVAL HISTORY :[name] is a 53 y.o. female who has a history of multiple co-morbidities including morbid obesity, fibromyalgia, lichen planus, hypertension, hypercholesterolemia, coronary artery disease, depression, diabetes, OSA, who is here for follow up. She was initially seen by Dr. Juliann Mule 6 month ago for abnormal SPEP, and protein was negative.   She states there is no significant change since her last visit 6 month ago. She still has bilateral knee pain and back pain from her rheumatoid arthritis. She had lumbar injection 3 month ago for arthritis related pain, and she feels she needs it agin. She also has b/l knee pain but did not respond to steroids injection. She is going to follow-up her orthopedic surgeon. She has a diagnosis involving the spleen, and has multiple skin lesions which itches. She walks with a crane, but only can walk for short distance. No fever, chills, no other new pain.   PAST MEDICAL HISTORY:  has a past medical history of Abdominal pain; Urinary tract infection, site not specified; Sinusitis; Osteoarthritis; Sarcoidosis; Esophageal reflux; Depression; Anxiety; Vitamin D deficiency (2009); Vitamin B 12 deficiency (2009); Ruptured appendix; Hypertension; OSA (obstructive sleep apnea); Allergic rhinitis; ABDOMINAL PAIN RIGHT LOWER QUADRANT (02/07/2007); ALLERGIC RHINITIS (10/26/2009); ANXIETY (08/14/2006); B12 DEFICIENCY (08/25/2007); DEPRESSION (08/14/2006); DYSPNEA (04/28/2009); ELEVATED BP (07/10/2007); GRIEF REACTION (08/01/2009); HYPERGLYCEMIA (12/30/2008); HYPERTENSION (08/27/2007); INSOMNIA, CHRONIC (03/07/2010); NAUSEA (03/07/2010); OBESITY (03/07/2010); VISUAL  CHANGES (07/02/2007); VITAMIN D DEFICIENCY (08/25/2007); Shortness of breath; and Family history of breast cancer.     PAST SURGICAL HISTORY: Past Surgical History  Procedure Laterality Date  . Arthroscopic knee surgery  11-12-04    L  . Appendectomy    . Endometrial ablation    . Axillary abcess irrigation and debridement  Jul & Aug2012     CURRENT MEDICATIONS: has a current medication list which includes the following prescription(s): carvedilol, clobetasol cream, escitalopram, metformin, nexium, nortriptyline, oxycodone, potassium chloride, topiramate, diclofenac potassium, doxycycline, freestyle lite, freestyle, meclizine, mupirocin ointment, and voltaren.   ALLERGIES: Enalapril maleate; Hydrochlorothiazide; Hydroxychloroquine sulfate; Iodine; and Latex   SOCIAL HISTORY:  reports that she quit smoking about 11 years ago. Her smoking use included Cigarettes. She has a 5 pack-year smoking history. She does not have any smokeless tobacco history on file. She reports that she does not drink alcohol or use illicit drugs.   FAMILY HISTORY: family history includes Breast cancer in an other family member; Cancer in her father; Coronary artery disease in some other family members; Heart disease in her mother; Heart failure in an other family member; Hypertension in an other family member; Kidney disease in her mother.   LABORATORY DATA:  Results for orders placed or performed in visit on 12/06/13 (from the past 48 hour(s))  CBC with Differential     Status: Abnormal   Collection Time: 12/06/13  9:30 AM  Result Value Ref Range   WBC 6.0 3.9 - 10.3 10e3/uL   NEUT# 3.6 1.5 - 6.5 10e3/uL   HGB 13.4 11.6 - 15.9 g/dL   HCT 43.0 34.8 - 46.6 %   Platelets 216 145 - 400 10e3/uL   MCV 92.1 79.5 - 101.0 fL   MCH 28.7 25.1 - 34.0 pg   MCHC 31.2 (L)  31.5 - 36.0 g/dL   RBC 4.67 3.70 - 5.45 10e6/uL   RDW 14.5 11.2 - 14.5 %   lymph# 1.7 0.9 - 3.3 10e3/uL   MONO# 0.6 0.1 - 0.9 10e3/uL    Eosinophils Absolute 0.1 0.0 - 0.5 10e3/uL   Basophils Absolute 0.0 0.0 - 0.1 10e3/uL   NEUT% 59.6 38.4 - 76.8 %   LYMPH% 28.8 14.0 - 49.7 %   MONO% 9.3 0.0 - 14.0 %   EOS% 2.0 0.0 - 7.0 %   BASO% 0.3 0.0 - 2.0 %  Comprehensive metabolic panel (Cmet) - [facility]     Status: Abnormal   Collection Time: 12/06/13  9:30 AM  Result Value Ref Range   Sodium 140 136 - 145 mEq/L   Potassium 3.7 3.5 - 5.1 mEq/L   Chloride 106 98 - 109 mEq/L   CO2 24 22 - 29 mEq/L   Glucose 110 70 - 140 mg/dl   BUN 11.0 7.0 - 26.0 mg/dL   Creatinine 1.1 0.6 - 1.1 mg/dL   Total Bilirubin 0.34 0.20 - 1.20 mg/dL   Alkaline Phosphatase 63 40 - 150 U/L   AST 29 5 - 34 U/L   ALT 22 0 - 55 U/L   Total Protein 6.6 6.4 - 8.3 g/dL   Albumin 3.0 (L) 3.5 - 5.0 g/dL   Calcium 9.4 8.4 - 10.4 mg/dL   Anion Gap 10 3 - 11 mEq/L       RADIOGRAPHY: No new studies.    REVIEW OF SYSTEMS:  Constitutional: Denies fevers, chills or abnormal weight loss Eyes: Denies blurriness of vision Ears, nose, mouth, throat, and face: Denies mucositis or sore throat Respiratory: she reports a productive cough, but denies dyspnea or wheezes Cardiovascular: Denies palpitation, chest discomfort or lower extremity swelling Gastrointestinal:  Denies nausea, heartburn or change in bowel habits Skin: (+) skin rash from sarcoidosis Lymphatics: Denies new lymphadenopathy or easy bruising Neurological:Denies numbness, tingling or new weaknesses Behavioral/Psych: Mood is stable, no new changes  All other systems were reviewed with the patient and are negative except those stated in history  PHYSICAL EXAM:  height is 5\' 7"  (1.702 m) and weight is 311 lb 12.8 oz (141.432 kg). Her oral temperature is 98 F (36.7 C). Her blood pressure is 125/60 and her pulse is 84. Her respiration is 22 and oxygen saturation is 92%.    GENERAL:alert, no distress and comfortable; morbidly obese, sitting in a wheelchair SKIN: skin color, texture, turgor are normal, (+)  skin rash on abdomen, and a small healing wound in the left lower extremity EYES: normal, Conjunctiva are pink and non-injected, sclera clear OROPHARYNX:no exudate, no erythema and lips, buccal mucosa, and tongue normal  NECK: supple, thyroid normal size, non-tender, without nodularity LYMPH:  no palpable lymphadenopathy in the cervical, axillary or inguinal LUNGS: clear to auscultation and percussion with normal breathing effort HEART: regular rate & rhythm and no murmurs and no lower extremity edema ABDOMEN:abdomen soft, non-tender and normal bowel sounds Musculoskeletal:no cyanosis of digits and no clubbing  NEURO: alert & oriented x 3 with fluent speech, no focal motor/sensory deficits; ambulates via a cane.    IMPRESSION: [name] is a 53 y.o. female with a history of abnormal SPEP   1.  Abnormal SPE/IFE.  -- The previous SPEP/IFE showed no M-spike and her SPEP appears polyclonal.  Kappa light chain was slightly elevated and light chain ratio was normal.  -The results are still pending, CBC is normal. If her SPEP  and IFE results are unchanged, I do not think she has MGUS.   I'll call her with her lab results in a few days. If MGUS is not suspected, she does not need to follow-up with [place]. She will continue follow-up with her primary care physician and other specialties for her other medical problems.  I spent 15 minutes counseling the patient face to face. The total time spent in the appointment was 20 minutes.   Truitt Merle, MD 12/06/2013 10:41 AM

## 2013-12-06 NOTE — Patient Instructions (Signed)
I will call you with your lab results. If normal, you do not need to follow with you.  Please follow up with PCP and other specialists for other medical problems.

## 2013-12-07 LAB — KAPPA/LAMBDA LIGHT CHAINS
KAPPA LAMBDA RATIO: 1 (ref 0.26–1.65)
Kappa free light chain: 1.51 mg/dL (ref 0.33–1.94)
LAMBDA FREE LGHT CHN: 1.51 mg/dL (ref 0.57–2.63)

## 2013-12-08 LAB — SPEP & IFE WITH QIG
ALBUMIN ELP: 49.6 % — AB (ref 55.8–66.1)
ALPHA-1-GLOBULIN: 5.7 % — AB (ref 2.9–4.9)
ALPHA-2-GLOBULIN: 13.4 % — AB (ref 7.1–11.8)
BETA GLOBULIN: 6.6 % (ref 4.7–7.2)
Beta 2: 7.7 % — ABNORMAL HIGH (ref 3.2–6.5)
Gamma Globulin: 17 % (ref 11.1–18.8)
IgA: 324 mg/dL (ref 69–380)
IgG (Immunoglobin G), Serum: 1080 mg/dL (ref 690–1700)
IgM, Serum: 85 mg/dL (ref 52–322)
TOTAL PROTEIN, SERUM ELECTROPHOR: 6.4 g/dL (ref 6.0–8.3)

## 2013-12-17 ENCOUNTER — Encounter (HOSPITAL_BASED_OUTPATIENT_CLINIC_OR_DEPARTMENT_OTHER): Payer: Medicare Other | Attending: Internal Medicine

## 2013-12-17 DIAGNOSIS — L97919 Non-pressure chronic ulcer of unspecified part of right lower leg with unspecified severity: Secondary | ICD-10-CM | POA: Insufficient documentation

## 2013-12-17 DIAGNOSIS — E11622 Type 2 diabetes mellitus with other skin ulcer: Secondary | ICD-10-CM | POA: Diagnosis not present

## 2013-12-17 LAB — GLUCOSE, CAPILLARY: Glucose-Capillary: 116 mg/dL — ABNORMAL HIGH (ref 70–99)

## 2013-12-23 ENCOUNTER — Encounter: Payer: Self-pay | Admitting: Internal Medicine

## 2013-12-23 ENCOUNTER — Ambulatory Visit (INDEPENDENT_AMBULATORY_CARE_PROVIDER_SITE_OTHER): Payer: Medicare Other | Admitting: Internal Medicine

## 2013-12-23 VITALS — BP 130/70 | HR 90 | Ht 67.0 in | Wt 307.0 lb

## 2013-12-23 DIAGNOSIS — Z23 Encounter for immunization: Secondary | ICD-10-CM | POA: Diagnosis not present

## 2013-12-23 DIAGNOSIS — I2510 Atherosclerotic heart disease of native coronary artery without angina pectoris: Secondary | ICD-10-CM

## 2013-12-23 DIAGNOSIS — E538 Deficiency of other specified B group vitamins: Secondary | ICD-10-CM

## 2013-12-23 DIAGNOSIS — G4700 Insomnia, unspecified: Secondary | ICD-10-CM | POA: Diagnosis not present

## 2013-12-23 DIAGNOSIS — E559 Vitamin D deficiency, unspecified: Secondary | ICD-10-CM

## 2013-12-23 DIAGNOSIS — F4321 Adjustment disorder with depressed mood: Secondary | ICD-10-CM | POA: Diagnosis not present

## 2013-12-23 DIAGNOSIS — F331 Major depressive disorder, recurrent, moderate: Secondary | ICD-10-CM

## 2013-12-23 DIAGNOSIS — M25569 Pain in unspecified knee: Secondary | ICD-10-CM

## 2013-12-23 MED ORDER — VITAMIN B-12 1000 MCG SL SUBL: 1.0000 | SUBLINGUAL_TABLET | Freq: Every day | SUBLINGUAL | Status: DC

## 2013-12-23 MED ORDER — VITAMIN D 1000 UNITS PO TABS: 1000.0000 [IU] | ORAL_TABLET | Freq: Every day | ORAL | Status: AC

## 2013-12-23 MED ORDER — TRAZODONE HCL 100 MG PO TABS: 100.0000 mg | ORAL_TABLET | Freq: Every day | ORAL | Status: DC

## 2013-12-23 NOTE — Assessment & Plan Note (Addendum)
Re-start Rx 

## 2013-12-23 NOTE — Progress Notes (Signed)
Pre visit review using our clinic review tool, if applicable. No additional management support is needed unless otherwise documented below in the visit note. 

## 2013-12-23 NOTE — Progress Notes (Signed)
Subjective:    HPI  Pt is s/p a new Rhematology eval by Dr Estanislado Pandy and a Pain Clinic eval by Dr Vira Blanco - she had a radiofrequency treatment on the left  F/u not being able to walk right - "I don't know which foot to put first"  x 4 months. F/u memory issues  Rash is worse since Dr Charlestine Night was tapering off steroids ??Lichen vs sarcoid: no Prenisone since Dec 2014  F/u dizzy spells, weakness, blurred vision since 2 wks ago.  A little better. CBG was ok F/u B L>R knee pain - seeing Ortho Dr Marlou Sa Pt was planning a TKR R in 8/14 - put on hold - trying to loose wt... F/u wt gain  The patient presents for a follow-up of  chronic hypertension, chronic dyslipidemia, type 2 diabetes controlled with medicines; possible CAD F/u rash on face F/u sarcoid - Dr Charlestine Night thought it was inactive - tapering off prednisone - now is on 10 mg/d  C/o R knee pain -- shots helped - Dr Marlou Sa. They are considering TKR    Wt Readings from Last 3 Encounters:  12/23/13 307 lb (139.254 kg)  12/06/13 311 lb 12.8 oz (141.432 kg)  09/13/13 332 lb (150.594 kg)   BP Readings from Last 3 Encounters:  12/23/13 130/70  12/06/13 125/60  09/24/13 118/64      Review of Systems  Constitutional: Positive for unexpected weight change. Negative for activity change and appetite change.  HENT: Negative for mouth sores and sinus pressure.   Eyes: Negative for visual disturbance.  Respiratory: Negative for chest tightness.   Genitourinary: Negative for difficulty urinating and vaginal pain.  Musculoskeletal: Negative for back pain and gait problem.  Skin: Negative for pallor.  Neurological: Negative for dizziness, tremors and weakness.  Psychiatric/Behavioral: Positive for sleep disturbance. Negative for suicidal ideas, hallucinations, confusion and agitation. The patient is nervous/anxious.         Objective:   Physical Exam  Constitutional: She appears well-developed. No distress.  Obese   HENT:   Head: Normocephalic.  Right Ear: External ear normal.  Left Ear: External ear normal.  Nose: Nose normal.  Mouth/Throat: Oropharynx is clear and moist.  Eyes: Conjunctivae are normal. Pupils are equal, round, and reactive to light. Right eye exhibits no discharge. Left eye exhibits no discharge.  Neck: Normal range of motion. Neck supple. No JVD present. No tracheal deviation present. No thyromegaly present.  Cardiovascular: Normal rate, regular rhythm and normal heart sounds.   Pulmonary/Chest: No stridor. No respiratory distress. She has no wheezes.  Abdominal: Soft. Bowel sounds are normal. She exhibits no distension and no mass. There is no tenderness. There is no rebound and no guarding.  Musculoskeletal: She exhibits no edema or tenderness.  Lymphadenopathy:    She has no cervical adenopathy.  Neurological: She displays normal reflexes. No cranial nerve deficit. She exhibits normal muscle tone. Coordination normal.  Skin: No rash noted. No erythema.  Psychiatric: She has a normal mood and affect. Her behavior is normal. Judgment and thought content normal.  SD on face Sarcoid or lychen lesions on L thigh, L arm, face, wrists R knee is less tender  L lower lateral ribs are tender to palp Cane      Lab Results  Component Value Date   WBC 6.0 12/06/2013   HGB 13.4 12/06/2013   HCT 43.0 12/06/2013   PLT 216 12/06/2013   CHOL 193 06/29/2008   TRIG 145.0 06/29/2008   HDL 67.50 06/29/2008  ALT 22 12/06/2013   AST 29 12/06/2013   NA 140 12/06/2013   K 3.7 12/06/2013   CL 103 04/27/2013   CREATININE 1.1 12/06/2013   BUN 11.0 12/06/2013   CO2 24 12/06/2013   TSH 2.08 04/27/2013   INR 1.01 10/01/2010   HGBA1C 6.8* 08/02/2013   MICROALBUR 0.5 03/03/2012   Chest CT 2015 LS, C-spine, B hips X rays reviewed  Assessment & Plan:

## 2013-12-23 NOTE — Assessment & Plan Note (Signed)
D/c amitrypt - stopped working Start Trazodone

## 2013-12-23 NOTE — Assessment & Plan Note (Signed)
Restart

## 2013-12-23 NOTE — Assessment & Plan Note (Signed)
Discussed w pt

## 2013-12-23 NOTE — Assessment & Plan Note (Signed)
F/u w/Dr Marlou Sa - may need surgery - TKR Dr Estanislado Pandy - off Prednisone Chronic   Wt loss

## 2013-12-23 NOTE — Assessment & Plan Note (Signed)
Continue with current prescription therapy as reflected on the Med list.  

## 2013-12-24 DIAGNOSIS — L97919 Non-pressure chronic ulcer of unspecified part of right lower leg with unspecified severity: Secondary | ICD-10-CM | POA: Diagnosis not present

## 2013-12-24 LAB — GLUCOSE, CAPILLARY: GLUCOSE-CAPILLARY: 113 mg/dL — AB (ref 70–99)

## 2013-12-30 ENCOUNTER — Other Ambulatory Visit: Payer: Self-pay | Admitting: Internal Medicine

## 2013-12-30 DIAGNOSIS — M1710 Unilateral primary osteoarthritis, unspecified knee: Secondary | ICD-10-CM | POA: Diagnosis not present

## 2013-12-30 DIAGNOSIS — M79609 Pain in unspecified limb: Secondary | ICD-10-CM | POA: Diagnosis not present

## 2013-12-30 DIAGNOSIS — M791 Myalgia: Secondary | ICD-10-CM | POA: Diagnosis not present

## 2013-12-30 DIAGNOSIS — M1990 Unspecified osteoarthritis, unspecified site: Secondary | ICD-10-CM | POA: Diagnosis not present

## 2013-12-30 DIAGNOSIS — M5137 Other intervertebral disc degeneration, lumbosacral region: Secondary | ICD-10-CM | POA: Diagnosis not present

## 2013-12-30 DIAGNOSIS — Z79891 Long term (current) use of opiate analgesic: Secondary | ICD-10-CM | POA: Diagnosis not present

## 2013-12-30 DIAGNOSIS — G5792 Unspecified mononeuropathy of left lower limb: Secondary | ICD-10-CM | POA: Diagnosis not present

## 2013-12-30 DIAGNOSIS — Z79899 Other long term (current) drug therapy: Secondary | ICD-10-CM | POA: Diagnosis not present

## 2013-12-30 DIAGNOSIS — G894 Chronic pain syndrome: Secondary | ICD-10-CM | POA: Diagnosis not present

## 2013-12-30 DIAGNOSIS — G5791 Unspecified mononeuropathy of right lower limb: Secondary | ICD-10-CM | POA: Diagnosis not present

## 2014-01-03 DIAGNOSIS — L97919 Non-pressure chronic ulcer of unspecified part of right lower leg with unspecified severity: Secondary | ICD-10-CM | POA: Diagnosis not present

## 2014-01-03 LAB — GLUCOSE, CAPILLARY: GLUCOSE-CAPILLARY: 135 mg/dL — AB (ref 70–99)

## 2014-01-10 DIAGNOSIS — L97919 Non-pressure chronic ulcer of unspecified part of right lower leg with unspecified severity: Secondary | ICD-10-CM | POA: Diagnosis not present

## 2014-01-17 ENCOUNTER — Encounter (HOSPITAL_BASED_OUTPATIENT_CLINIC_OR_DEPARTMENT_OTHER): Payer: Medicare Other | Attending: Plastic Surgery

## 2014-01-17 DIAGNOSIS — L97821 Non-pressure chronic ulcer of other part of left lower leg limited to breakdown of skin: Secondary | ICD-10-CM | POA: Diagnosis not present

## 2014-01-17 DIAGNOSIS — E11622 Type 2 diabetes mellitus with other skin ulcer: Secondary | ICD-10-CM | POA: Diagnosis not present

## 2014-01-17 DIAGNOSIS — L97811 Non-pressure chronic ulcer of other part of right lower leg limited to breakdown of skin: Secondary | ICD-10-CM | POA: Insufficient documentation

## 2014-01-19 ENCOUNTER — Other Ambulatory Visit: Payer: Self-pay | Admitting: Internal Medicine

## 2014-01-20 ENCOUNTER — Telehealth: Payer: Self-pay | Admitting: Internal Medicine

## 2014-01-20 DIAGNOSIS — M549 Dorsalgia, unspecified: Secondary | ICD-10-CM | POA: Diagnosis not present

## 2014-01-20 DIAGNOSIS — M25569 Pain in unspecified knee: Secondary | ICD-10-CM | POA: Diagnosis not present

## 2014-01-20 DIAGNOSIS — M545 Low back pain: Secondary | ICD-10-CM | POA: Diagnosis not present

## 2014-01-20 DIAGNOSIS — M25559 Pain in unspecified hip: Secondary | ICD-10-CM | POA: Diagnosis not present

## 2014-01-20 DIAGNOSIS — M79606 Pain in leg, unspecified: Secondary | ICD-10-CM | POA: Diagnosis not present

## 2014-01-20 DIAGNOSIS — Z79899 Other long term (current) drug therapy: Secondary | ICD-10-CM | POA: Diagnosis not present

## 2014-01-20 DIAGNOSIS — M488X6 Other specified spondylopathies, lumbar region: Secondary | ICD-10-CM | POA: Diagnosis not present

## 2014-01-20 DIAGNOSIS — M47817 Spondylosis without myelopathy or radiculopathy, lumbosacral region: Secondary | ICD-10-CM | POA: Diagnosis not present

## 2014-01-20 DIAGNOSIS — G894 Chronic pain syndrome: Secondary | ICD-10-CM | POA: Diagnosis not present

## 2014-01-20 NOTE — Telephone Encounter (Signed)
OV w/any proveder tomorrow. Thx

## 2014-01-20 NOTE — Telephone Encounter (Signed)
Dr Paralee Cancel, Azelia's pain clinic provider told pt to call PCP and have him order xray/or MRI of left calf, for nagging pain in her calf, now moving up thigh. [phone] is (pain clinic provider). Please advise.

## 2014-01-21 ENCOUNTER — Encounter: Payer: Self-pay | Admitting: Family

## 2014-01-21 ENCOUNTER — Other Ambulatory Visit: Payer: Medicare Other

## 2014-01-21 ENCOUNTER — Ambulatory Visit (INDEPENDENT_AMBULATORY_CARE_PROVIDER_SITE_OTHER): Payer: Medicare Other | Admitting: Family

## 2014-01-21 ENCOUNTER — Telehealth: Payer: Self-pay | Admitting: Internal Medicine

## 2014-01-21 VITALS — BP 122/64 | HR 91 | Temp 98.2°F | Resp 16 | Ht 67.0 in | Wt 302.0 lb

## 2014-01-21 DIAGNOSIS — M79669 Pain in unspecified lower leg: Secondary | ICD-10-CM | POA: Insufficient documentation

## 2014-01-21 DIAGNOSIS — M79662 Pain in left lower leg: Secondary | ICD-10-CM

## 2014-01-21 NOTE — Patient Instructions (Signed)
Thank you for choosing [facility].  Summary/Instructions:  Your prescription(s) have been submitted to your pharmacy or been printed and provided for you. Please take as directed and contact our office if you believe you are having problem(s) with the medication(s) or have any questions.  Please stop by the lab on the basement level of the building for your blood work. Your results will be released to [place] (or called to you) after review, usually within 72 hours after test completion. If any changes need to be made, you will be notified at that same time.  If your symptoms worsen or fail to improve, please contact our office for further instruction, or in case of emergency go directly to the emergency room at the closest medical facility.   Deep Vein Thrombosis A deep vein thrombosis (DVT) is a blood clot that develops in the deep, larger veins of the leg, arm, or pelvis. These are more dangerous than clots that might form in veins near the surface of the body. A DVT can lead to serious and even life-threatening complications if the clot breaks off and travels in the bloodstream to the lungs.  A DVT can damage the valves in your leg veins so that instead of flowing upward, the blood pools in the lower leg. This is called post-thrombotic syndrome, and it can result in pain, swelling, discoloration, and sores on the leg. CAUSES Usually, several things contribute to the formation of blood clots. Contributing factors include:  The flow of blood slows down.  The inside of the vein is damaged in some way.  You have a condition that makes blood clot more easily. RISK FACTORS Some people are more likely than others to develop blood clots. Risk factors include:   Smoking.  Being overweight (obese).  Sitting or lying still for a long time. This includes long-distance travel, paralysis, or recovery from an illness or surgery. Other factors that increase risk are:   Older age,  especially over 2 years of age.  Having a family history of blood clots or if you have already had a blot clot.  Having major or lengthy surgery. This is especially true for surgery on the hip, knee, or belly (abdomen). Hip surgery is particularly high risk.  Having a long, thin tube (catheter) placed inside a vein during a medical procedure.  Breaking a hip or leg.  Having cancer or cancer treatment.  Pregnancy and childbirth.  Hormone changes make the blood clot more easily during pregnancy.  The fetus puts pressure on the veins of the pelvis.  There is a risk of injury to veins during delivery or a caesarean delivery. The risk is highest just after childbirth.  Medicines containing the female hormone estrogen. This includes birth control pills and hormone replacement therapy.  Other circulation or heart problems.  SIGNS AND SYMPTOMS When a clot forms, it can either partially or totally block the blood flow in that vein. Symptoms of a DVT can include:  Swelling of the leg or arm, especially if one side is much worse.  Warmth and redness of the leg or arm, especially if one side is much worse.  Pain in an arm or leg. If the clot is in the leg, symptoms may be more noticeable or worse when standing or walking. The symptoms of a DVT that has traveled to the lungs (pulmonary embolism, PE) usually start suddenly and include:  Shortness of breath.  Coughing.  Coughing up blood or blood-tinged mucus.  Chest pain. The  chest pain is often worse with deep breaths.  Rapid heartbeat. Anyone with these symptoms should get emergency medical treatment right away. Do not wait to see if the symptoms will go away. Call your local emergency services (911 in the [place] if you have these symptoms. Do not drive yourself to the hospital. DIAGNOSIS If a DVT is suspected, your health care provider will take a full medical history and perform a physical exam. Tests that also may be required  include:  Blood tests, including studies of the clotting properties of the blood.  Ultrasound to see if you have clots in your legs or lungs.  X-rays to show the flow of blood when dye is injected into the veins (venogram).  Studies of your lungs if you have any chest symptoms. PREVENTION  Exercise the legs regularly. Take a brisk 30-minute walk every day.  Maintain a weight that is appropriate for your height.  Avoid sitting or lying in bed for long periods of time without moving your legs.  Women, particularly those over the age of 64 years, should consider the risks and benefits of taking estrogen medicines, including birth control pills.  Do not smoke, especially if you take estrogen medicines.  Long-distance travel can increase your risk of DVT. You should exercise your legs by walking or pumping the muscles every hour.  Many of the risk factors above relate to situations that exist with hospitalization, either for illness, injury, or elective surgery. Prevention may include medical and nonmedical measures.  Your health care provider will assess you for the need for venous thromboembolism prevention when you are admitted to the hospital. If you are having surgery, your surgeon will assess you the day of or day after surgery. TREATMENT Once identified, a DVT can be treated. It can also be prevented in some circumstances. Once you have had a DVT, you may be at increased risk for a DVT in the future. The most common treatment for DVT is blood-thinning (anticoagulant) medicine, which reduces the blood's tendency to clot. Anticoagulants can stop new blood clots from forming and stop old clots from growing. They cannot dissolve existing clots. Your body does this by itself over time. Anticoagulants can be given by mouth, through an IV tube, or by injection. Your health care provider will determine the best program for you. Other medicines or treatments that may be used are:  Heparin or  related medicines (low molecular weight heparin) are often the first treatment for a blood clot. They act quickly. However, they cannot be taken orally and must be given either in shot form or by IV tube.  Heparin can cause a fall in a component of blood that stops bleeding and forms blood clots (platelets). You will be monitored with blood tests to be sure this does not occur.  Warfarin is an anticoagulant that can be swallowed. It takes a few days to start working, so usually heparin or related medicines are used in combination. Once warfarin is working, heparin is usually stopped.  Factor Xa inhibitor medicines, such as rivaroxaban and apixaban, also reduce blood clotting. These medicines are taken orally and can often be used without heparin or related medicines.  Less commonly, clot dissolving drugs (thrombolytics) are used to dissolve a DVT. They carry a high risk of bleeding, so they are used mainly in severe cases where your life or a part of your body is threatened.  Very rarely, a blood clot in the leg needs to be removed surgically.  If  you are unable to take anticoagulants, your health care provider may arrange for you to have a filter placed in a main vein in your abdomen. This filter prevents clots from traveling to your lungs. HOME CARE INSTRUCTIONS  Take all medicines as directed by your health care provider.  Learn as much as you can about DVT.  Wear a medical alert bracelet or carry a medical alert card.  Ask your health care provider how soon you can go back to normal activities. It is important to stay active to prevent blood clots. If you are on anticoagulant medicine, avoid contact sports.  It is very important to exercise. This is especially important while traveling, sitting, or standing for long periods of time. Exercise your legs by walking or by tightening and relaxing your leg muscles regularly. Take frequent walks.  You may need to wear compression stockings.  These are tight elastic stockings that apply pressure to the lower legs. This pressure can help keep the blood in the legs from clotting. Taking Warfarin Warfarin is a daily medicine that is taken by mouth. Your health care provider will advise you on the length of treatment (usually 3-6 months, sometimes lifelong). If you take warfarin:  Understand how to take warfarin and foods that can affect how warfarin works in Veterinary surgeon.  Too much and too little warfarin are both dangerous. Too much warfarin increases the risk of bleeding. Too little warfarin continues to allow the risk for blood clots. Warfarin and Regular Blood Testing While taking warfarin, you will need to have regular blood tests to measure your blood clotting time. These blood tests usually include both the prothrombin time (PT) and international normalized ratio (INR) tests. The PT and INR results allow your health care provider to adjust your dose of warfarin. It is very important that you have your PT and INR tested as often as directed by your health care provider.  Warfarin and Your Diet Avoid major changes in your diet, or notify your health care provider before changing your diet. Arrange a visit with a registered dietitian to answer your questions. Many foods, especially foods high in vitamin K, can interfere with warfarin and affect the PT and INR results. You should eat a consistent amount of foods high in vitamin K. Foods high in vitamin K include:   Spinach, kale, broccoli, cabbage, collard and turnip greens, Brussels sprouts, peas, cauliflower, seaweed, and parsley.  Beef and pork liver.  Green tea.  Soybean oil. Warfarin with Other Medicines Many medicines can interfere with warfarin and affect the PT and INR results. You must:  Tell your health care provider about any and all medicines, vitamins, and supplements you take, including aspirin and other over-the-counter anti-inflammatory medicines. Be especially  cautious with aspirin and anti-inflammatory medicines. Ask your health care provider before taking these.  Do not take or discontinue any prescribed or over-the-counter medicine except on the advice of your health care provider or pharmacist. Warfarin Side Effects Warfarin can have side effects, such as easy bruising and difficulty stopping bleeding. Ask your health care provider or pharmacist about other side effects of warfarin. You will need to:  Hold pressure over cuts for longer than usual.  Notify your dentist and other health care providers that you are taking warfarin before you undergo any procedures where bleeding may occur. Warfarin with Alcohol and Tobacco   Drinking alcohol frequently can increase the effect of warfarin, leading to excess bleeding. It is best to avoid alcoholic drinks or  to consume only very small amounts while taking warfarin. Notify your health care provider if you change your alcohol intake.   Do not use any tobacco products including cigarettes, chewing tobacco, or electronic cigarettes. If you smoke, quit. Ask your health care provider for help with quitting smoking. Alternative Medicines to Warfarin: Factor Xa Inhibitor Medicines  These blood-thinning medicines are taken by mouth, usually for several weeks or longer. It is important to take the medicine every single day at the same time each day.  There are no regular blood tests required when using these medicines.  There are fewer food and drug interactions than with warfarin.  The side effects of this class of medicine are similar to those of warfarin, including excessive bruising or bleeding. Ask your health care provider or pharmacist about other potential side effects. SEEK MEDICAL CARE IF:  You notice a rapid heartbeat.  You feel weaker or more tired than usual.  You feel faint.  You notice increased bruising.  You feel your symptoms are not getting better in the time expected.  You  believe you are having side effects of medicine. SEEK IMMEDIATE MEDICAL CARE IF:  You have chest pain.  You have trouble breathing.  You have new or increased swelling or pain in one leg.  You cough up blood.  You notice blood in vomit, in a bowel movement, or in urine. MAKE SURE YOU:  Understand these instructions.  Will watch your condition.  Will get help right away if you are not doing well or get worse. Document Released: 12/31/2004 Document Revised: 05/17/2013 Document Reviewed: 09/07/2012 [facility] Patient Information 2015 [place], [place]. This information is not intended to replace advice given to you by your health care provider. Make sure you discuss any questions you have with your health care provider.

## 2014-01-21 NOTE — Telephone Encounter (Signed)
Spoke to [name] and informed [name] of the seriousness of a DVT and why PCP wanted her be seen today. [name] wanted to know what times were available today. WCB, [name] is going to see if she can get transportation.

## 2014-01-21 NOTE — Progress Notes (Signed)
Subjective:    Patient ID: [name], female    DOB: May 13, 1960, 54 y.o.   MRN: [id number]  Chief Complaint  Patient presents with  . Leg Pain    Pain in left calf going up to thigh, dr. Camila Li said she needed to come today    HPI:  [name] is a 54 y.o. female who presents today for an acute visit.   Acute symptoms of sharp, lingering and intense left calf pain started about 2 weeks ago. Describes frequency as waxes as wanes. Has tried to use a hand-operated massaging device which does not seem to help it. Denies any recent immobilizations or blood clotting disorders. Does have venous stasis.   Allergies  Allergen Reactions  . Enalapril Maleate     REACTION: cough  . Hydrochlorothiazide     REACTION: Low potassium  . Hydroxychloroquine Sulfate     REACTION: vision changes  . Iodine     Mother was intolerant--CODED on CT table---pt never tired.  . Latex     Hives    Current Outpatient Prescriptions on File Prior to Visit  Medication Sig Dispense Refill  . carvedilol (COREG) 12.5 MG tablet TAKE 1 TABLET BY MOUTH TWICE DAILY WITH MEALS 180 tablet 3  . cholecalciferol (VITAMIN D) 1000 UNITS tablet Take 1 tablet (1,000 Units total) by mouth daily. 100 tablet 3  . clobetasol cream (TEMOVATE) 1.93 % Apply 1 application topically 2 (two) times daily. Bid x 1 wk, stop 1-2 weeks & restart & cont cycle    . Cyanocobalamin (VITAMIN B-12) 1000 MCG SUBL Place 1 tablet (1,000 mcg total) under the tongue daily. 100 tablet 3  . Diclofenac Potassium (ZIPSOR) 25 MG CAPS Take 25 mg by mouth. Take 2 tabs BID as needed    . doxycycline (VIBRA-TABS) 100 MG tablet Take 1 tablet (100 mg total) by mouth 2 (two) times daily. 20 tablet 0  . escitalopram (LEXAPRO) 20 MG tablet TAKE 1 TABLET BY MOUTH DAILY 90 tablet 1  . FREESTYLE LITE test strip TEST TWICE DAILY 100 each 3  . Lancets (FREESTYLE) lancets 1 each by Other route 2 (two) times daily. Use as instructed 100 each 12    . metFORMIN (GLUCOPHAGE-XR) 500 MG 24 hr tablet Take 500 mg by mouth daily with breakfast.    . mupirocin ointment (BACTROBAN) 2 % Apply topically.  2  . NEXIUM 40 MG capsule TAKE 1 CAPSULE BY MOUTH EVERY DAY BEFORE BREAKFAST 90 capsule 2  . nortriptyline (PAMELOR) 25 MG capsule TAKE 1 TO 2 CAPSULES BY MOUTH EVERY NIGHT AT BEDTIME AS NEEDED FOR INSOMNIA 180 capsule 5  . oxyCODONE (ROXICODONE) 15 MG immediate release tablet Take 1 tablet (15 mg total) by mouth every 6 (six) hours as needed for pain. 120 tablet 0  . potassium chloride (K-DUR) 10 MEQ tablet TAKE 1 TABLET BY MOUTH DAILY 90 tablet 3  . topiramate (TOPAMAX) 50 MG tablet TAKE 1 TABLET BY MOUTH TWICE DAILY 60 tablet 0  . VOLTAREN 1 % GEL Apply topically 3 (three) times daily as needed.  5  . [DISCONTINUED] potassium chloride (K-DUR,KLOR-CON) 10 MEQ tablet TAKE 1 TABLET BY MOUTH DAILY 90 tablet 2   No current facility-administered medications on file prior to visit.    Past Medical History  Diagnosis Date  . Abdominal pain     rlq  . Urinary tract infection, site not specified   . Sinusitis   . Osteoarthritis   . Sarcoidosis  Dr. Lolita Patella  . Esophageal reflux   . Depression   . Anxiety   . Vitamin D deficiency 2009  . Vitamin B 12 deficiency 2009  . Ruptured appendix   . Hypertension   . OSA (obstructive sleep apnea)   . Allergic rhinitis   . ABDOMINAL PAIN RIGHT LOWER QUADRANT 02/07/2007  . ALLERGIC RHINITIS 10/26/2009  . ANXIETY 08/14/2006  . B12 DEFICIENCY 08/25/2007  . DEPRESSION 08/14/2006  . DYSPNEA 04/28/2009  . ELEVATED BP 07/10/2007  . GRIEF REACTION 08/01/2009  . HYPERGLYCEMIA 12/30/2008  . HYPERTENSION 08/27/2007  . INSOMNIA, CHRONIC 03/07/2010  . NAUSEA 03/07/2010  . OBESITY 03/07/2010  . VISUAL CHANGES 07/02/2007  . VITAMIN D DEFICIENCY 08/25/2007  . Shortness of breath   . Family history of breast cancer     sister    Review of Systems  Constitutional: Negative for fever and chills.  Cardiovascular:  Positive for leg swelling.  Musculoskeletal: Positive for myalgias (in calf).      Objective:    BP 122/64 mmHg  Pulse 91  Temp(Src) 98.2 F (36.8 C) (Oral)  Resp 16  Ht 5\' 7"  (1.702 m)  Wt 302 lb (136.986 kg)  BMI 47.29 kg/m2  SpO2 92% Nursing note and vital signs reviewed.  Physical Exam  Constitutional: She is oriented to person, place, and time. She appears well-developed and well-nourished. No distress.  Cardiovascular: Normal rate, regular rhythm, normal heart sounds and intact distal pulses.   Left leg noted to be swollen, however diameter measured at 10 cm below the tibial tuberosity is actually less than the right leg. Tenderness to touch elicited lateral head of the left gastrocnemius muscle. Positive Homans sign.  Pulmonary/Chest: Effort normal and breath sounds normal.  Neurological: She is alert and oriented to person, place, and time.  Skin: Skin is warm and dry.  Psychiatric: She has a normal mood and affect. Her behavior is normal. Judgment and thought content normal.       Assessment & Plan:

## 2014-01-21 NOTE — Assessment & Plan Note (Signed)
Presentation of calf pain cannot rule out DVT given history of venous stasis. Obtain d-dimer. Scheduled for venous Dopplers as soon as possible. Patient instructed on red flags and warning signs for potential pulmonary embolism. She is in agreement to seek emergency medical care if those red flags develop. Follow up pending results of venous Dopplers.

## 2014-01-21 NOTE — Telephone Encounter (Signed)
Pt cb and has appt for today.

## 2014-01-21 NOTE — Telephone Encounter (Signed)
Pt opted to come 1/15 and see PCP.

## 2014-01-21 NOTE — Telephone Encounter (Signed)
It can't wait if DVT was suspected - needs to see another provider today Thx

## 2014-01-21 NOTE — Telephone Encounter (Signed)
Called pt again, left message for her to pls schedule appt with any provider today, pt stated earlier no transportation until next week. Pt instructed to call MD office back.

## 2014-01-22 LAB — D-DIMER, QUANTITATIVE: D-Dimer, Quant: 1.92 ug/mL-FEU — ABNORMAL HIGH (ref 0.00–0.48)

## 2014-01-24 ENCOUNTER — Ambulatory Visit (HOSPITAL_COMMUNITY): Payer: Medicare Other | Attending: Cardiovascular Disease | Admitting: *Deleted

## 2014-01-24 DIAGNOSIS — M79662 Pain in left lower leg: Secondary | ICD-10-CM | POA: Diagnosis not present

## 2014-01-24 DIAGNOSIS — I10 Essential (primary) hypertension: Secondary | ICD-10-CM | POA: Diagnosis not present

## 2014-01-24 DIAGNOSIS — R609 Edema, unspecified: Secondary | ICD-10-CM | POA: Diagnosis not present

## 2014-01-24 DIAGNOSIS — E669 Obesity, unspecified: Secondary | ICD-10-CM | POA: Diagnosis not present

## 2014-01-24 DIAGNOSIS — I2510 Atherosclerotic heart disease of native coronary artery without angina pectoris: Secondary | ICD-10-CM | POA: Diagnosis not present

## 2014-01-24 DIAGNOSIS — E119 Type 2 diabetes mellitus without complications: Secondary | ICD-10-CM | POA: Diagnosis not present

## 2014-01-24 DIAGNOSIS — M79605 Pain in left leg: Secondary | ICD-10-CM | POA: Diagnosis not present

## 2014-01-24 DIAGNOSIS — R600 Localized edema: Secondary | ICD-10-CM | POA: Diagnosis not present

## 2014-01-24 DIAGNOSIS — Z8249 Family history of ischemic heart disease and other diseases of the circulatory system: Secondary | ICD-10-CM | POA: Insufficient documentation

## 2014-01-24 NOTE — Progress Notes (Signed)
Venous Duplex Lower Left Performed

## 2014-01-26 ENCOUNTER — Telehealth: Payer: Self-pay | Admitting: Family

## 2014-01-26 NOTE — Telephone Encounter (Signed)
Please inform the patient that her venous doppler leg studies were negative for blood clots. Therefore most likely the pain is related to increased swelling in her legs. If the problem continues, please have her follow up.

## 2014-01-26 NOTE — Telephone Encounter (Signed)
Pt aware and understands. Will follow up if needed.

## 2014-01-28 ENCOUNTER — Ambulatory Visit: Payer: Medicare Other | Admitting: Internal Medicine

## 2014-02-08 ENCOUNTER — Other Ambulatory Visit: Payer: Self-pay | Admitting: *Deleted

## 2014-02-08 MED ORDER — TOPIRAMATE 50 MG PO TABS: 50.0000 mg | ORAL_TABLET | Freq: Two times a day (BID) | ORAL | Status: DC

## 2014-02-10 DIAGNOSIS — M47817 Spondylosis without myelopathy or radiculopathy, lumbosacral region: Secondary | ICD-10-CM | POA: Diagnosis not present

## 2014-02-10 DIAGNOSIS — Z79899 Other long term (current) drug therapy: Secondary | ICD-10-CM | POA: Diagnosis not present

## 2014-02-10 DIAGNOSIS — M545 Low back pain: Secondary | ICD-10-CM | POA: Diagnosis not present

## 2014-02-10 DIAGNOSIS — G894 Chronic pain syndrome: Secondary | ICD-10-CM | POA: Diagnosis not present

## 2014-02-24 DIAGNOSIS — M25561 Pain in right knee: Secondary | ICD-10-CM | POA: Diagnosis not present

## 2014-02-25 ENCOUNTER — Telehealth: Payer: Self-pay | Admitting: Internal Medicine

## 2014-02-25 DIAGNOSIS — R21 Rash and other nonspecific skin eruption: Secondary | ICD-10-CM

## 2014-02-25 NOTE — Telephone Encounter (Signed)
Patient is asking for referral to dermatologist- dr. Ubaldo Glassing for allergy test. This is intended to allow her to get a knee replacement.

## 2014-02-28 NOTE — Telephone Encounter (Signed)
Ok Thx 

## 2014-03-01 ENCOUNTER — Telehealth: Payer: Self-pay | Admitting: Internal Medicine

## 2014-03-01 NOTE — Telephone Encounter (Signed)
Noted. Thx.

## 2014-03-01 NOTE — Telephone Encounter (Signed)
Miss [name] called from [facility] to let Dr.plot know that [name] is changing the in home provider services. FYI

## 2014-03-01 NOTE — Telephone Encounter (Signed)
Called pt no answer LMOM md has place referral will be contacted once appt has been set-up...[name]

## 2014-03-02 ENCOUNTER — Other Ambulatory Visit: Payer: Self-pay | Admitting: Internal Medicine

## 2014-03-04 ENCOUNTER — Ambulatory Visit: Payer: Medicare Other | Admitting: Pulmonary Disease

## 2014-03-08 DIAGNOSIS — M542 Cervicalgia: Secondary | ICD-10-CM | POA: Diagnosis not present

## 2014-03-08 DIAGNOSIS — M797 Fibromyalgia: Secondary | ICD-10-CM | POA: Diagnosis not present

## 2014-03-08 DIAGNOSIS — M170 Bilateral primary osteoarthritis of knee: Secondary | ICD-10-CM | POA: Diagnosis not present

## 2014-03-08 DIAGNOSIS — M79641 Pain in right hand: Secondary | ICD-10-CM | POA: Diagnosis not present

## 2014-03-10 DIAGNOSIS — G894 Chronic pain syndrome: Secondary | ICD-10-CM | POA: Diagnosis not present

## 2014-03-10 DIAGNOSIS — Z79899 Other long term (current) drug therapy: Secondary | ICD-10-CM | POA: Diagnosis not present

## 2014-03-10 DIAGNOSIS — M47817 Spondylosis without myelopathy or radiculopathy, lumbosacral region: Secondary | ICD-10-CM | POA: Diagnosis not present

## 2014-03-10 DIAGNOSIS — M4697 Unspecified inflammatory spondylopathy, lumbosacral region: Secondary | ICD-10-CM | POA: Diagnosis not present

## 2014-03-15 ENCOUNTER — Ambulatory Visit: Payer: Medicare Other | Admitting: Pulmonary Disease

## 2014-03-16 ENCOUNTER — Encounter (HOSPITAL_BASED_OUTPATIENT_CLINIC_OR_DEPARTMENT_OTHER): Payer: Medicare Other | Attending: Surgery

## 2014-03-16 DIAGNOSIS — E11622 Type 2 diabetes mellitus with other skin ulcer: Secondary | ICD-10-CM | POA: Diagnosis not present

## 2014-03-16 DIAGNOSIS — L97821 Non-pressure chronic ulcer of other part of left lower leg limited to breakdown of skin: Secondary | ICD-10-CM | POA: Diagnosis not present

## 2014-03-16 DIAGNOSIS — K219 Gastro-esophageal reflux disease without esophagitis: Secondary | ICD-10-CM | POA: Insufficient documentation

## 2014-03-16 DIAGNOSIS — I87332 Chronic venous hypertension (idiopathic) with ulcer and inflammation of left lower extremity: Secondary | ICD-10-CM | POA: Insufficient documentation

## 2014-03-16 DIAGNOSIS — E559 Vitamin D deficiency, unspecified: Secondary | ICD-10-CM | POA: Diagnosis not present

## 2014-03-17 LAB — GLUCOSE, CAPILLARY: GLUCOSE-CAPILLARY: 145 mg/dL — AB (ref 70–99)

## 2014-03-18 ENCOUNTER — Telehealth: Payer: Self-pay | Admitting: Internal Medicine

## 2014-03-18 NOTE — Telephone Encounter (Signed)
Pt is having RKR in April. Signed order faxed to [facility].

## 2014-03-18 NOTE — Telephone Encounter (Signed)
Pt called in and is wanting a shower step stood script [facility] to [facility]   [facility]

## 2014-03-23 DIAGNOSIS — E559 Vitamin D deficiency, unspecified: Secondary | ICD-10-CM | POA: Diagnosis not present

## 2014-03-23 DIAGNOSIS — K219 Gastro-esophageal reflux disease without esophagitis: Secondary | ICD-10-CM | POA: Diagnosis not present

## 2014-03-23 DIAGNOSIS — I87332 Chronic venous hypertension (idiopathic) with ulcer and inflammation of left lower extremity: Secondary | ICD-10-CM | POA: Diagnosis not present

## 2014-03-23 DIAGNOSIS — E11622 Type 2 diabetes mellitus with other skin ulcer: Secondary | ICD-10-CM | POA: Diagnosis not present

## 2014-03-23 DIAGNOSIS — L97821 Non-pressure chronic ulcer of other part of left lower leg limited to breakdown of skin: Secondary | ICD-10-CM | POA: Diagnosis not present

## 2014-03-24 ENCOUNTER — Other Ambulatory Visit (INDEPENDENT_AMBULATORY_CARE_PROVIDER_SITE_OTHER): Payer: Medicare Other

## 2014-03-24 ENCOUNTER — Encounter: Payer: Self-pay | Admitting: Internal Medicine

## 2014-03-24 ENCOUNTER — Ambulatory Visit (INDEPENDENT_AMBULATORY_CARE_PROVIDER_SITE_OTHER): Payer: Medicare Other | Admitting: Internal Medicine

## 2014-03-24 VITALS — BP 140/72 | HR 91 | Temp 97.9°F | Wt 287.0 lb

## 2014-03-24 DIAGNOSIS — E1140 Type 2 diabetes mellitus with diabetic neuropathy, unspecified: Secondary | ICD-10-CM

## 2014-03-24 DIAGNOSIS — I10 Essential (primary) hypertension: Secondary | ICD-10-CM

## 2014-03-24 DIAGNOSIS — M159 Polyosteoarthritis, unspecified: Secondary | ICD-10-CM

## 2014-03-24 DIAGNOSIS — E538 Deficiency of other specified B group vitamins: Secondary | ICD-10-CM | POA: Diagnosis not present

## 2014-03-24 DIAGNOSIS — E1165 Type 2 diabetes mellitus with hyperglycemia: Secondary | ICD-10-CM | POA: Diagnosis not present

## 2014-03-24 DIAGNOSIS — F411 Generalized anxiety disorder: Secondary | ICD-10-CM | POA: Diagnosis not present

## 2014-03-24 DIAGNOSIS — I2510 Atherosclerotic heart disease of native coronary artery without angina pectoris: Secondary | ICD-10-CM

## 2014-03-24 DIAGNOSIS — R21 Rash and other nonspecific skin eruption: Secondary | ICD-10-CM

## 2014-03-24 DIAGNOSIS — M150 Primary generalized (osteo)arthritis: Secondary | ICD-10-CM | POA: Diagnosis not present

## 2014-03-24 LAB — HEPATIC FUNCTION PANEL
ALK PHOS: 72 U/L (ref 39–117)
ALT: 22 U/L (ref 0–35)
AST: 25 U/L (ref 0–37)
Albumin: 3.7 g/dL (ref 3.5–5.2)
BILIRUBIN TOTAL: 0.4 mg/dL (ref 0.2–1.2)
Bilirubin, Direct: 0 mg/dL (ref 0.0–0.3)
TOTAL PROTEIN: 7.4 g/dL (ref 6.0–8.3)

## 2014-03-24 LAB — BASIC METABOLIC PANEL
BUN: 11 mg/dL (ref 6–23)
CHLORIDE: 104 meq/L (ref 96–112)
CO2: 29 meq/L (ref 19–32)
Calcium: 9.9 mg/dL (ref 8.4–10.5)
Creatinine, Ser: 1.06 mg/dL (ref 0.40–1.20)
GFR: 57.49 mL/min — AB (ref >60.00)
Glucose, Bld: 117 mg/dL — ABNORMAL HIGH (ref 70–99)
POTASSIUM: 4.1 meq/L (ref 3.5–5.1)
SODIUM: 139 meq/L (ref 135–145)

## 2014-03-24 LAB — TSH: TSH: 4.98 u[IU]/mL — ABNORMAL HIGH (ref 0.35–4.50)

## 2014-03-24 LAB — HEMOGLOBIN A1C: Hgb A1c MFr Bld: 6.8 % — ABNORMAL HIGH (ref 4.6–6.5)

## 2014-03-24 MED ORDER — CYANOCOBALAMIN 1000 MCG/ML IJ SOLN
1000.0000 ug | Freq: Once | INTRAMUSCULAR | Status: AC
  Administered 2014-03-24: 1000 ug via INTRAMUSCULAR

## 2014-03-24 NOTE — Assessment & Plan Note (Signed)
On Coreg 

## 2014-03-24 NOTE — Assessment & Plan Note (Signed)
Restart ASA.

## 2014-03-24 NOTE — Assessment & Plan Note (Signed)
On Metformin Labs 

## 2014-03-24 NOTE — Progress Notes (Signed)
Pre visit review using our clinic review tool, if applicable. No additional management support is needed unless otherwise documented below in the visit note. 

## 2014-03-24 NOTE — Assessment & Plan Note (Signed)
On Pamelor 

## 2014-03-24 NOTE — Assessment & Plan Note (Signed)
On po B12 Risks associated with treatment noncompliance were discussed. Compliance was encouraged. Vit B12 sq 1000 mcg

## 2014-03-24 NOTE — Assessment & Plan Note (Signed)
Per [facility]

## 2014-03-24 NOTE — Assessment & Plan Note (Signed)
Lichen planus (vs sarcoid) Per Dermatology - Dr Ubaldo Glassing - appt tomorrow

## 2014-03-24 NOTE — Progress Notes (Signed)
Subjective:    HPI  Pt is s/p a new Rhematology eval by Dr Estanislado Pandy and a Pain Clinic eval by Dr Vira Blanco - she had a radiofrequency treatment on the left  F/u not being able to walk right - "I don't know which foot to put first"  x 4 months. F/u memory issues  Rash is worse since Dr Charlestine Night was tapering off steroids ??Lichen vs sarcoid: no Prenisone since Dec 2014  F/u dizzy spells, weakness, blurred vision since 2 wks ago.  A little better. CBG was ok F/u B L>R knee pain - seeing Ortho Dr Marlou Sa Pt was planning a TKR R in 8/14 - put on hold - trying to loose wt... F/u wt gain  The patient presents for a follow-up of  chronic hypertension, chronic dyslipidemia, type 2 diabetes controlled with medicines; possible CAD F/u rash on face F/u sarcoid - Dr Charlestine Night thought it was inactive - tapering off prednisone - now is on 10 mg/d  C/o R knee pain -- shots helped - Dr Marlou Sa. They are considering TKR    Wt Readings from Last 3 Encounters:  03/24/14 287 lb (130.182 kg)  01/21/14 302 lb (136.986 kg)  12/23/13 307 lb (139.254 kg)   BP Readings from Last 3 Encounters:  03/24/14 140/72  01/21/14 122/64  12/23/13 130/70      Review of Systems  Constitutional: Positive for unexpected weight change. Negative for activity change and appetite change.  HENT: Negative for mouth sores and sinus pressure.   Eyes: Negative for visual disturbance.  Respiratory: Negative for chest tightness.   Genitourinary: Negative for difficulty urinating and vaginal pain.  Musculoskeletal: Negative for back pain and gait problem.  Skin: Negative for pallor.  Neurological: Negative for dizziness, tremors and weakness.  Psychiatric/Behavioral: Positive for sleep disturbance. Negative for suicidal ideas, hallucinations, confusion and agitation. The patient is nervous/anxious.         Objective:   Physical Exam  Constitutional: She appears well-developed. No distress.  Obese   HENT:  Head:  Normocephalic.  Right Ear: External ear normal.  Left Ear: External ear normal.  Nose: Nose normal.  Mouth/Throat: Oropharynx is clear and moist.  Eyes: Conjunctivae are normal. Pupils are equal, round, and reactive to light. Right eye exhibits no discharge. Left eye exhibits no discharge.  Neck: Normal range of motion. Neck supple. No JVD present. No tracheal deviation present. No thyromegaly present.  Cardiovascular: Normal rate, regular rhythm and normal heart sounds.   Pulmonary/Chest: No stridor. No respiratory distress. She has no wheezes.  Abdominal: Soft. Bowel sounds are normal. She exhibits no distension and no mass. There is no tenderness. There is no rebound and no guarding.  Musculoskeletal: She exhibits no edema or tenderness.  Lymphadenopathy:    She has no cervical adenopathy.  Neurological: She displays normal reflexes. No cranial nerve deficit. She exhibits normal muscle tone. Coordination normal.  Skin: No rash noted. No erythema.  Psychiatric: She has a normal mood and affect. Her behavior is normal. Judgment and thought content normal.  SD on face Sarcoid or lychen lesions on L thigh, L arm, face, wrists R knee is less tender  L lower lateral ribs are tender to palp Cane      Lab Results  Component Value Date   WBC 6.0 12/06/2013   HGB 13.4 12/06/2013   HCT 43.0 12/06/2013   PLT 216 12/06/2013   CHOL 193 06/29/2008   TRIG 145.0 06/29/2008   HDL 67.50 06/29/2008  ALT 22 12/06/2013   AST 29 12/06/2013   NA 140 12/06/2013   K 3.7 12/06/2013   CL 103 04/27/2013   CREATININE 1.1 12/06/2013   BUN 11.0 12/06/2013   CO2 24 12/06/2013   TSH 2.08 04/27/2013   INR 1.01 10/01/2010   HGBA1C 6.8* 08/02/2013   MICROALBUR 0.5 03/03/2012   Chest CT 2015 LS, C-spine, B hips X rays reviewed  Assessment & Plan:

## 2014-03-25 ENCOUNTER — Telehealth: Payer: Self-pay | Admitting: Internal Medicine

## 2014-03-25 ENCOUNTER — Other Ambulatory Visit: Payer: Self-pay | Admitting: *Deleted

## 2014-03-25 ENCOUNTER — Other Ambulatory Visit (INDEPENDENT_AMBULATORY_CARE_PROVIDER_SITE_OTHER): Payer: Medicare Other

## 2014-03-25 DIAGNOSIS — T887XXA Unspecified adverse effect of drug or medicament, initial encounter: Secondary | ICD-10-CM | POA: Diagnosis not present

## 2014-03-25 DIAGNOSIS — T50905A Adverse effect of unspecified drugs, medicaments and biological substances, initial encounter: Secondary | ICD-10-CM

## 2014-03-25 DIAGNOSIS — I10 Essential (primary) hypertension: Secondary | ICD-10-CM

## 2014-03-25 DIAGNOSIS — L433 Subacute (active) lichen planus: Secondary | ICD-10-CM | POA: Diagnosis not present

## 2014-03-25 DIAGNOSIS — T50905S Adverse effect of unspecified drugs, medicaments and biological substances, sequela: Secondary | ICD-10-CM

## 2014-03-25 DIAGNOSIS — L239 Allergic contact dermatitis, unspecified cause: Secondary | ICD-10-CM | POA: Diagnosis not present

## 2014-03-25 DIAGNOSIS — R5383 Other fatigue: Secondary | ICD-10-CM

## 2014-03-25 DIAGNOSIS — IMO0002 Reserved for concepts with insufficient information to code with codable children: Secondary | ICD-10-CM

## 2014-03-25 DIAGNOSIS — E1165 Type 2 diabetes mellitus with hyperglycemia: Secondary | ICD-10-CM

## 2014-03-25 DIAGNOSIS — D649 Anemia, unspecified: Secondary | ICD-10-CM

## 2014-03-25 LAB — CBC WITH DIFFERENTIAL/PLATELET
BASOS ABS: 0 10*3/uL (ref 0.0–0.1)
Basophils Relative: 0.2 % (ref 0.0–3.0)
Eosinophils Absolute: 0.2 10*3/uL (ref 0.0–0.7)
Eosinophils Relative: 2.4 % (ref 0.0–5.0)
HEMATOCRIT: 42.6 % (ref 36.0–46.0)
HEMOGLOBIN: 13.8 g/dL (ref 12.0–15.0)
Lymphocytes Relative: 22.5 % (ref 12.0–46.0)
Lymphs Abs: 1.8 10*3/uL (ref 0.7–4.0)
MCHC: 32.5 g/dL (ref 30.0–36.0)
MCV: 91.3 fl (ref 78.0–100.0)
MONOS PCT: 6 % (ref 3.0–12.0)
Monocytes Absolute: 0.5 10*3/uL (ref 0.1–1.0)
Neutro Abs: 5.6 10*3/uL (ref 1.4–7.7)
Neutrophils Relative %: 68.9 % (ref 43.0–77.0)
PLATELETS: 253 10*3/uL (ref 150.0–400.0)
RBC: 4.67 Mil/uL (ref 3.87–5.11)
RDW: 15.5 % (ref 11.5–15.5)
WBC: 8.1 10*3/uL (ref 4.0–10.5)

## 2014-03-25 LAB — COMPREHENSIVE METABOLIC PANEL
ALBUMIN: 3.7 g/dL (ref 3.5–5.2)
ALT: 23 U/L (ref 0–35)
AST: 28 U/L (ref 0–37)
Alkaline Phosphatase: 69 U/L (ref 39–117)
BUN: 11 mg/dL (ref 6–23)
CALCIUM: 9.8 mg/dL (ref 8.4–10.5)
CHLORIDE: 104 meq/L (ref 96–112)
CO2: 26 meq/L (ref 19–32)
Creatinine, Ser: 1.07 mg/dL (ref 0.40–1.20)
GFR: 56.87 mL/min — ABNORMAL LOW (ref >60.00)
GLUCOSE: 99 mg/dL (ref 70–99)
Potassium: 4.4 mEq/L (ref 3.5–5.1)
SODIUM: 139 meq/L (ref 135–145)
TOTAL PROTEIN: 7.2 g/dL (ref 6.0–8.3)
Total Bilirubin: 0.3 mg/dL (ref 0.2–1.2)

## 2014-03-25 NOTE — Telephone Encounter (Signed)
Ok Done Thx 

## 2014-03-25 NOTE — Telephone Encounter (Signed)
Amber is requesting for lab order for CBC, CMP, CNet to be put in STAT. She states that this is time sensitive. Please give her a call back to let her know it was put in

## 2014-03-28 ENCOUNTER — Telehealth: Payer: Self-pay | Admitting: Internal Medicine

## 2014-03-28 NOTE — Telephone Encounter (Signed)
Patient called requesting referral to Dr. Donneta Romberg for allergy testing. Her appointment is made for 04/18/14 @ 9:45am. Left msg for pt to call office.

## 2014-03-29 NOTE — Telephone Encounter (Signed)
Patient called re: allergy testing. Advised of below note

## 2014-04-04 ENCOUNTER — Telehealth: Payer: Self-pay | Admitting: Internal Medicine

## 2014-04-04 NOTE — Telephone Encounter (Signed)
Heather From Dr Ubaldo Glassing , [facility]   [phone] ext [phone]  Fax number [phone]  She is need copy of labs faxed over

## 2014-04-05 NOTE — Telephone Encounter (Signed)
Copy of labs faxed to [facility] per her request. Returned her call to notified

## 2014-04-06 DIAGNOSIS — M170 Bilateral primary osteoarthritis of knee: Secondary | ICD-10-CM | POA: Diagnosis not present

## 2014-04-07 DIAGNOSIS — M25569 Pain in unspecified knee: Secondary | ICD-10-CM | POA: Diagnosis not present

## 2014-04-07 DIAGNOSIS — G894 Chronic pain syndrome: Secondary | ICD-10-CM | POA: Diagnosis not present

## 2014-04-07 DIAGNOSIS — Z79899 Other long term (current) drug therapy: Secondary | ICD-10-CM | POA: Diagnosis not present

## 2014-04-07 DIAGNOSIS — M4697 Unspecified inflammatory spondylopathy, lumbosacral region: Secondary | ICD-10-CM | POA: Diagnosis not present

## 2014-04-15 ENCOUNTER — Ambulatory Visit: Payer: Medicare Other | Admitting: Pulmonary Disease

## 2014-04-18 DIAGNOSIS — L230 Allergic contact dermatitis due to metals: Secondary | ICD-10-CM | POA: Diagnosis not present

## 2014-04-18 DIAGNOSIS — R21 Rash and other nonspecific skin eruption: Secondary | ICD-10-CM | POA: Diagnosis not present

## 2014-04-20 DIAGNOSIS — L230 Allergic contact dermatitis due to metals: Secondary | ICD-10-CM | POA: Diagnosis not present

## 2014-04-20 DIAGNOSIS — R21 Rash and other nonspecific skin eruption: Secondary | ICD-10-CM | POA: Diagnosis not present

## 2014-04-21 DIAGNOSIS — R202 Paresthesia of skin: Secondary | ICD-10-CM | POA: Diagnosis not present

## 2014-04-24 ENCOUNTER — Other Ambulatory Visit: Payer: Self-pay | Admitting: Internal Medicine

## 2014-04-28 ENCOUNTER — Ambulatory Visit (INDEPENDENT_AMBULATORY_CARE_PROVIDER_SITE_OTHER): Payer: Medicare Other | Admitting: *Deleted

## 2014-04-28 DIAGNOSIS — E538 Deficiency of other specified B group vitamins: Secondary | ICD-10-CM | POA: Diagnosis not present

## 2014-04-28 MED ORDER — CYANOCOBALAMIN 1000 MCG/ML IJ SOLN
1000.0000 ug | Freq: Once | INTRAMUSCULAR | Status: AC
  Administered 2014-04-28: 1000 ug via INTRAMUSCULAR

## 2014-05-05 DIAGNOSIS — Z79899 Other long term (current) drug therapy: Secondary | ICD-10-CM | POA: Diagnosis not present

## 2014-05-05 DIAGNOSIS — M4697 Unspecified inflammatory spondylopathy, lumbosacral region: Secondary | ICD-10-CM | POA: Diagnosis not present

## 2014-05-05 DIAGNOSIS — M25569 Pain in unspecified knee: Secondary | ICD-10-CM | POA: Diagnosis not present

## 2014-05-05 DIAGNOSIS — M150 Primary generalized (osteo)arthritis: Secondary | ICD-10-CM | POA: Diagnosis not present

## 2014-05-05 DIAGNOSIS — G894 Chronic pain syndrome: Secondary | ICD-10-CM | POA: Diagnosis not present

## 2014-05-10 DIAGNOSIS — M5137 Other intervertebral disc degeneration, lumbosacral region: Secondary | ICD-10-CM | POA: Diagnosis not present

## 2014-05-10 DIAGNOSIS — G5602 Carpal tunnel syndrome, left upper limb: Secondary | ICD-10-CM | POA: Diagnosis not present

## 2014-05-10 DIAGNOSIS — M170 Bilateral primary osteoarthritis of knee: Secondary | ICD-10-CM | POA: Diagnosis not present

## 2014-05-10 DIAGNOSIS — M791 Myalgia: Secondary | ICD-10-CM | POA: Diagnosis not present

## 2014-05-18 ENCOUNTER — Ambulatory Visit: Payer: Medicare Other | Admitting: Pulmonary Disease

## 2014-05-18 ENCOUNTER — Ambulatory Visit (INDEPENDENT_AMBULATORY_CARE_PROVIDER_SITE_OTHER): Payer: Medicare Other | Admitting: Pulmonary Disease

## 2014-05-18 DIAGNOSIS — D869 Sarcoidosis, unspecified: Secondary | ICD-10-CM

## 2014-05-18 LAB — PULMONARY FUNCTION TEST
DL/VA % pred: 91 %
DL/VA: 4.61 ml/min/mmHg/L
DLCO UNC: 16.78 ml/min/mmHg
DLCO unc % pred: 62 %
FEF 25-75 Post: 1.45 L/sec
FEF 25-75 Pre: 1.09 L/sec
FEF2575-%Change-Post: 32 %
FEF2575-%PRED-PRE: 40 %
FEF2575-%Pred-Post: 53 %
FEV1-%CHANGE-POST: 7 %
FEV1-%PRED-PRE: 54 %
FEV1-%Pred-Post: 58 %
FEV1-Post: 1.7 L
FEV1-Pre: 1.58 L
FEV1FVC-%CHANGE-POST: 1 %
FEV1FVC-%Pred-Pre: 87 %
FEV6-%Change-Post: 2 %
FEV6-%PRED-POST: 65 %
FEV6-%Pred-Pre: 63 %
FEV6-Post: 2.36 L
FEV6-Pre: 2.29 L
FEV6FVC-%PRED-PRE: 103 %
FEV6FVC-%Pred-Post: 103 %
FVC-%Change-Post: 6 %
FVC-%PRED-PRE: 61 %
FVC-%Pred-Post: 65 %
FVC-Post: 2.43 L
FVC-Pre: 2.29 L
POST FEV1/FVC RATIO: 70 %
Post FEV6/FVC ratio: 100 %
Pre FEV1/FVC ratio: 69 %
Pre FEV6/FVC Ratio: 100 %

## 2014-05-18 NOTE — Progress Notes (Signed)
PFT performed today. 

## 2014-05-19 ENCOUNTER — Other Ambulatory Visit (HOSPITAL_COMMUNITY): Payer: Self-pay | Admitting: Orthopedic Surgery

## 2014-05-19 DIAGNOSIS — G5601 Carpal tunnel syndrome, right upper limb: Secondary | ICD-10-CM | POA: Diagnosis not present

## 2014-05-19 DIAGNOSIS — M1712 Unilateral primary osteoarthritis, left knee: Secondary | ICD-10-CM | POA: Diagnosis not present

## 2014-05-19 DIAGNOSIS — G5602 Carpal tunnel syndrome, left upper limb: Secondary | ICD-10-CM | POA: Diagnosis not present

## 2014-05-19 DIAGNOSIS — M1711 Unilateral primary osteoarthritis, right knee: Secondary | ICD-10-CM | POA: Diagnosis not present

## 2014-05-20 ENCOUNTER — Encounter (HOSPITAL_COMMUNITY): Payer: Self-pay | Admitting: *Deleted

## 2014-05-20 NOTE — Progress Notes (Signed)
Saw Dr.Nishan in 2015 checking to see if she had Calcium around her heart  Stress test in epic from 2015   Echo report in epic from 2008  Denies ever having a heart cath  Medical Md is Dr.Plotnikov  Denies EKG in past yr  Denies CXR in past yr

## 2014-05-23 ENCOUNTER — Encounter (HOSPITAL_COMMUNITY): Payer: Self-pay | Admitting: *Deleted

## 2014-05-23 ENCOUNTER — Ambulatory Visit (HOSPITAL_COMMUNITY)
Admission: RE | Admit: 2014-05-23 | Discharge: 2014-05-23 | Disposition: A | Discharge status: Home / Self Care | Payer: Medicare Other | Source: Ambulatory Visit | Attending: Orthopedic Surgery | Admitting: Orthopedic Surgery

## 2014-05-23 ENCOUNTER — Ambulatory Visit (HOSPITAL_COMMUNITY): Payer: Medicare Other

## 2014-05-23 ENCOUNTER — Ambulatory Visit (HOSPITAL_COMMUNITY): Payer: Medicare Other | Admitting: Anesthesiology

## 2014-05-23 ENCOUNTER — Encounter (HOSPITAL_COMMUNITY)
Admission: RE | Disposition: A | Discharge status: Home / Self Care | Payer: Self-pay | Source: Ambulatory Visit | Attending: Orthopedic Surgery

## 2014-05-23 DIAGNOSIS — Z791 Long term (current) use of non-steroidal anti-inflammatories (NSAID): Secondary | ICD-10-CM | POA: Diagnosis not present

## 2014-05-23 DIAGNOSIS — K219 Gastro-esophageal reflux disease without esophagitis: Secondary | ICD-10-CM | POA: Insufficient documentation

## 2014-05-23 DIAGNOSIS — M1990 Unspecified osteoarthritis, unspecified site: Secondary | ICD-10-CM | POA: Insufficient documentation

## 2014-05-23 DIAGNOSIS — E559 Vitamin D deficiency, unspecified: Secondary | ICD-10-CM | POA: Insufficient documentation

## 2014-05-23 DIAGNOSIS — Z79899 Other long term (current) drug therapy: Secondary | ICD-10-CM | POA: Insufficient documentation

## 2014-05-23 DIAGNOSIS — F419 Anxiety disorder, unspecified: Secondary | ICD-10-CM | POA: Diagnosis not present

## 2014-05-23 DIAGNOSIS — E538 Deficiency of other specified B group vitamins: Secondary | ICD-10-CM | POA: Diagnosis not present

## 2014-05-23 DIAGNOSIS — M797 Fibromyalgia: Secondary | ICD-10-CM | POA: Insufficient documentation

## 2014-05-23 DIAGNOSIS — G4700 Insomnia, unspecified: Secondary | ICD-10-CM | POA: Insufficient documentation

## 2014-05-23 DIAGNOSIS — I2510 Atherosclerotic heart disease of native coronary artery without angina pectoris: Secondary | ICD-10-CM | POA: Diagnosis not present

## 2014-05-23 DIAGNOSIS — F329 Major depressive disorder, single episode, unspecified: Secondary | ICD-10-CM | POA: Insufficient documentation

## 2014-05-23 DIAGNOSIS — G5602 Carpal tunnel syndrome, left upper limb: Secondary | ICD-10-CM | POA: Insufficient documentation

## 2014-05-23 DIAGNOSIS — Z6841 Body Mass Index (BMI) 40.0 and over, adult: Secondary | ICD-10-CM | POA: Diagnosis not present

## 2014-05-23 DIAGNOSIS — R0602 Shortness of breath: Secondary | ICD-10-CM | POA: Diagnosis not present

## 2014-05-23 DIAGNOSIS — G43909 Migraine, unspecified, not intractable, without status migrainosus: Secondary | ICD-10-CM | POA: Diagnosis not present

## 2014-05-23 DIAGNOSIS — I10 Essential (primary) hypertension: Secondary | ICD-10-CM | POA: Insufficient documentation

## 2014-05-23 DIAGNOSIS — Z01818 Encounter for other preprocedural examination: Secondary | ICD-10-CM | POA: Diagnosis not present

## 2014-05-23 DIAGNOSIS — R9431 Abnormal electrocardiogram [ECG] [EKG]: Secondary | ICD-10-CM | POA: Diagnosis not present

## 2014-05-23 DIAGNOSIS — E119 Type 2 diabetes mellitus without complications: Secondary | ICD-10-CM | POA: Insufficient documentation

## 2014-05-23 DIAGNOSIS — Z8673 Personal history of transient ischemic attack (TIA), and cerebral infarction without residual deficits: Secondary | ICD-10-CM | POA: Diagnosis not present

## 2014-05-23 DIAGNOSIS — G4733 Obstructive sleep apnea (adult) (pediatric): Secondary | ICD-10-CM | POA: Insufficient documentation

## 2014-05-23 DIAGNOSIS — Z79891 Long term (current) use of opiate analgesic: Secondary | ICD-10-CM | POA: Diagnosis not present

## 2014-05-23 DIAGNOSIS — D869 Sarcoidosis, unspecified: Secondary | ICD-10-CM

## 2014-05-23 DIAGNOSIS — M069 Rheumatoid arthritis, unspecified: Secondary | ICD-10-CM | POA: Diagnosis not present

## 2014-05-23 DIAGNOSIS — Z87891 Personal history of nicotine dependence: Secondary | ICD-10-CM | POA: Insufficient documentation

## 2014-05-23 DIAGNOSIS — M25532 Pain in left wrist: Secondary | ICD-10-CM | POA: Diagnosis present

## 2014-05-23 HISTORY — DX: Other amnesia: R41.3

## 2014-05-23 HISTORY — DX: Transient cerebral ischemic attack, unspecified: G45.9

## 2014-05-23 HISTORY — DX: Lichen planus, unspecified: L43.9

## 2014-05-23 HISTORY — DX: Disorientation, unspecified: R41.0

## 2014-05-23 HISTORY — DX: Headache, unspecified: R51.9

## 2014-05-23 HISTORY — DX: Type 2 diabetes mellitus without complications: E11.9

## 2014-05-23 HISTORY — PX: CARPAL TUNNEL RELEASE: SHX101

## 2014-05-23 HISTORY — DX: Rheumatoid arthritis, unspecified: M06.9

## 2014-05-23 HISTORY — DX: Personal history of other infectious and parasitic diseases: Z86.19

## 2014-05-23 HISTORY — DX: Pain in unspecified joint: M25.50

## 2014-05-23 HISTORY — DX: Nocturia: R35.1

## 2014-05-23 HISTORY — DX: Insomnia, unspecified: G47.00

## 2014-05-23 HISTORY — DX: Urgency of urination: R39.15

## 2014-05-23 HISTORY — DX: Effusion, unspecified joint: M25.40

## 2014-05-23 HISTORY — DX: Headache: R51

## 2014-05-23 HISTORY — DX: Pneumonia, unspecified organism: J18.9

## 2014-05-23 LAB — CBC
HEMATOCRIT: 43.2 % (ref 36.0–46.0)
Hemoglobin: 14 g/dL (ref 12.0–15.0)
MCH: 30.3 pg (ref 26.0–34.0)
MCHC: 32.4 g/dL (ref 30.0–36.0)
MCV: 93.5 fL (ref 78.0–100.0)
Platelets: 222 10*3/uL (ref 150–400)
RBC: 4.62 MIL/uL (ref 3.87–5.11)
RDW: 13.6 % (ref 11.5–15.5)
WBC: 6.1 10*3/uL (ref 4.0–10.5)

## 2014-05-23 LAB — BASIC METABOLIC PANEL
Anion gap: 9 (ref 5–15)
BUN: 10 mg/dL (ref 6–20)
CALCIUM: 9.3 mg/dL (ref 8.9–10.3)
CO2: 25 mmol/L (ref 22–32)
Chloride: 107 mmol/L (ref 101–111)
Creatinine, Ser: 1.17 mg/dL — ABNORMAL HIGH (ref 0.44–1.00)
GFR calc Af Amer: >60 mL/min (ref >60)
GFR calc non Af Amer: 52 mL/min — ABNORMAL LOW (ref >60)
GLUCOSE: 115 mg/dL — AB (ref 70–99)
Potassium: 3.8 mmol/L (ref 3.5–5.1)
SODIUM: 141 mmol/L (ref 135–145)

## 2014-05-23 LAB — GLUCOSE, CAPILLARY
GLUCOSE-CAPILLARY: 113 mg/dL — AB (ref 70–99)
Glucose-Capillary: 108 mg/dL — ABNORMAL HIGH (ref 70–99)

## 2014-05-23 SURGERY — CARPAL TUNNEL RELEASE
Anesthesia: Regional | Site: Wrist | Laterality: Left

## 2014-05-23 MED ORDER — FENTANYL CITRATE (PF) 100 MCG/2ML IJ SOLN
INTRAMUSCULAR | Status: DC
Start: 2014-05-23 — End: 2014-05-23
  Filled 2014-05-23: qty 2

## 2014-05-23 MED ORDER — FENTANYL CITRATE (PF) 100 MCG/2ML IJ SOLN
INTRAMUSCULAR | Status: DC | PRN
  Administered 2014-05-23: 75 ug via INTRAVENOUS
  Administered 2014-05-23: 50 ug via INTRAVENOUS

## 2014-05-23 MED ORDER — CEFAZOLIN SODIUM-DEXTROSE 2-3 GM-% IV SOLR
INTRAVENOUS | Status: DC | PRN
  Administered 2014-05-23 (×2): 2 g via INTRAVENOUS

## 2014-05-23 MED ORDER — CEFAZOLIN SODIUM-DEXTROSE 2-3 GM-% IV SOLR
INTRAVENOUS | Status: AC
Start: 2014-05-23 — End: 2014-05-23
  Filled 2014-05-23: qty 50

## 2014-05-23 MED ORDER — PROPOFOL 10 MG/ML IV BOLUS
INTRAVENOUS | Status: DC | PRN
  Administered 2014-05-23: 10 mg via INTRAVENOUS
  Administered 2014-05-23: 40 mg via INTRAVENOUS
  Administered 2014-05-23: 150 mg via INTRAVENOUS

## 2014-05-23 MED ORDER — FENTANYL CITRATE (PF) 250 MCG/5ML IJ SOLN
INTRAMUSCULAR | Status: AC
  Filled 2014-05-23: qty 5

## 2014-05-23 MED ORDER — SUCCINYLCHOLINE CHLORIDE 20 MG/ML IJ SOLN
INTRAMUSCULAR | Status: DC | PRN
  Administered 2014-05-23: 100 mg via INTRAVENOUS

## 2014-05-23 MED ORDER — DEXTROSE 5 % IV SOLN
INTRAVENOUS | Status: DC | PRN
  Administered 2014-05-23: 12:00:00 via INTRAVENOUS

## 2014-05-23 MED ORDER — 0.9 % SODIUM CHLORIDE (POUR BTL) OPTIME
TOPICAL | Status: DC | PRN
  Administered 2014-05-23: 1000 mL

## 2014-05-23 MED ORDER — LACTATED RINGERS IV SOLN
INTRAVENOUS | Status: DC
  Administered 2014-05-23 (×2): via INTRAVENOUS

## 2014-05-23 MED ORDER — MIDAZOLAM HCL 5 MG/5ML IJ SOLN
INTRAMUSCULAR | Status: DC | PRN
  Administered 2014-05-23: 0.5 mg via INTRAVENOUS

## 2014-05-23 MED ORDER — PROPOFOL INFUSION 10 MG/ML OPTIME: INTRAVENOUS | Status: DC | PRN

## 2014-05-23 MED ORDER — MIDAZOLAM HCL 2 MG/2ML IJ SOLN
INTRAMUSCULAR | Status: AC
Start: 2014-05-23 — End: 2014-05-23
  Filled 2014-05-23: qty 2

## 2014-05-23 MED ORDER — ONDANSETRON HCL 4 MG/2ML IJ SOLN: 4.0000 mg | Freq: Once | INTRAMUSCULAR | Status: DC | PRN

## 2014-05-23 MED ORDER — BUPIVACAINE HCL (PF) 0.25 % IJ SOLN
INTRAMUSCULAR | Status: AC
  Filled 2014-05-23: qty 30

## 2014-05-23 MED ORDER — BUPIVACAINE HCL (PF) 0.25 % IJ SOLN
INTRAMUSCULAR | Status: DC | PRN
Start: 2014-05-23 — End: 2014-05-23
  Administered 2014-05-23: 30 mL

## 2014-05-23 MED ORDER — PROPOFOL 10 MG/ML IV BOLUS
INTRAVENOUS | Status: AC
  Filled 2014-05-23: qty 20

## 2014-05-23 MED ORDER — HYDROMORPHONE HCL 1 MG/ML IJ SOLN: 0.5000 mg | INTRAMUSCULAR | Status: DC | PRN

## 2014-05-23 SURGICAL SUPPLY — 49 items
BANDAGE ELASTIC 3 VELCRO ST LF (GAUZE/BANDAGES/DRESSINGS) ×6 IMPLANT
BANDAGE ELASTIC 4 VELCRO ST LF (GAUZE/BANDAGES/DRESSINGS) ×3 IMPLANT
BNDG ESMARK 4X9 LF (GAUZE/BANDAGES/DRESSINGS) ×3 IMPLANT
BNDG GAUZE ELAST 4 BULKY (GAUZE/BANDAGES/DRESSINGS) ×3 IMPLANT
CORDS BIPOLAR (ELECTRODE) ×3 IMPLANT
COVER SURGICAL LIGHT HANDLE (MISCELLANEOUS) ×3 IMPLANT
CUFF TOURNIQUET SINGLE 18IN (TOURNIQUET CUFF) ×3 IMPLANT
CUFF TOURNIQUET SINGLE 24IN (TOURNIQUET CUFF) IMPLANT
DRAPE SURG 17X23 STRL (DRAPES) ×3 IMPLANT
DRSG PAD ABDOMINAL 8X10 ST (GAUZE/BANDAGES/DRESSINGS) ×3 IMPLANT
DURAPREP 26ML APPLICATOR (WOUND CARE) ×3 IMPLANT
GAUZE SPONGE 4X4 12PLY STRL (GAUZE/BANDAGES/DRESSINGS) ×6 IMPLANT
GAUZE XEROFORM 1X8 LF (GAUZE/BANDAGES/DRESSINGS) ×3 IMPLANT
GLOVE BIOGEL PI IND STRL 8 (GLOVE) IMPLANT
GLOVE BIOGEL PI INDICATOR 8 (GLOVE)
GLOVE BIOGEL PI ORTHO PRO SZ8 (GLOVE) ×2
GLOVE PI ORTHO PRO STRL SZ8 (GLOVE) ×1 IMPLANT
GLOVE SURG ORTHO 8.0 STRL STRW (GLOVE) IMPLANT
GLOVE SURG SS PI 8.0 STRL IVOR (GLOVE) ×3 IMPLANT
GOWN STRL REUS W/ TWL LRG LVL3 (GOWN DISPOSABLE) ×2 IMPLANT
GOWN STRL REUS W/ TWL XL LVL3 (GOWN DISPOSABLE) ×1 IMPLANT
GOWN STRL REUS W/TWL LRG LVL3 (GOWN DISPOSABLE) ×4
GOWN STRL REUS W/TWL XL LVL3 (GOWN DISPOSABLE) ×2
KIT BASIN OR (CUSTOM PROCEDURE TRAY) ×3 IMPLANT
KIT ROOM TURNOVER OR (KITS) ×3 IMPLANT
LOOP VESSEL MAXI BLUE (MISCELLANEOUS) IMPLANT
NEEDLE HYPO 25GX1X1/2 BEV (NEEDLE) ×3 IMPLANT
NS IRRIG 1000ML POUR BTL (IV SOLUTION) ×3 IMPLANT
PACK ORTHO EXTREMITY (CUSTOM PROCEDURE TRAY) ×3 IMPLANT
PAD ABD 8X10 STRL (GAUZE/BANDAGES/DRESSINGS) ×3 IMPLANT
PAD ARMBOARD 7.5X6 YLW CONV (MISCELLANEOUS) ×6 IMPLANT
PAD CAST 4YDX4 CTTN HI CHSV (CAST SUPPLIES) ×1 IMPLANT
PADDING CAST COTTON 4X4 STRL (CAST SUPPLIES) ×2
SPLINT PLASTER CAST XFAST 4X15 (CAST SUPPLIES) ×1 IMPLANT
SPLINT PLASTER XTRA FAST SET 4 (CAST SUPPLIES) ×2
SPONGE GAUZE 4X4 12PLY STER LF (GAUZE/BANDAGES/DRESSINGS) ×3 IMPLANT
SUCTION FRAZIER TIP 10 FR DISP (SUCTIONS) IMPLANT
SUT ETHILON 3 0 PS 1 (SUTURE) ×6 IMPLANT
SUT VIC AB 2-0 CT1 27 (SUTURE)
SUT VIC AB 2-0 CT1 TAPERPNT 27 (SUTURE) IMPLANT
SUT VIC AB 3-0 FS2 27 (SUTURE) IMPLANT
SYR CONTROL 10ML LL (SYRINGE) ×3 IMPLANT
SYSTEM CHEST DRAIN TLS 7FR (DRAIN) IMPLANT
TOWEL OR 17X24 6PK STRL BLUE (TOWEL DISPOSABLE) ×6 IMPLANT
TOWEL OR 17X26 10 PK STRL BLUE (TOWEL DISPOSABLE) ×3 IMPLANT
TUBE CONNECTING 12'X1/4 (SUCTIONS) ×1
TUBE CONNECTING 12X1/4 (SUCTIONS) ×2 IMPLANT
UNDERPAD 30X30 INCONTINENT (UNDERPADS AND DIAPERS) ×3 IMPLANT
WATER STERILE IRR 1000ML POUR (IV SOLUTION) ×3 IMPLANT

## 2014-05-23 NOTE — Addendum Note (Signed)
Addendum  created 05/23/14 1418 by Terrill Mohr, CRNA   Modules edited: Anesthesia Attestations

## 2014-05-23 NOTE — Anesthesia Procedure Notes (Signed)
Procedure Name: Intubation Date/Time: 05/23/2014 12:18 PM Performed by: Williemae Area B Pre-anesthesia Checklist: Patient identified, Emergency Drugs available, Suction available and Patient being monitored Patient Re-evaluated:Patient Re-evaluated prior to inductionOxygen Delivery Method: Circle system utilized Preoxygenation: Pre-oxygenation with 100% oxygen Intubation Type: IV induction Ventilation: Two handed mask ventilation required and Oral airway inserted - appropriate to patient size Laryngoscope Size: Mac and 3 Grade View: Grade I Tube type: Oral Tube size: 7.5 mm Number of attempts: 1 Airway Equipment and Method: Stylet Placement Confirmation: positive ETCO2,  ETT inserted through vocal cords under direct vision and breath sounds checked- equal and bilateral Secured at: 21 (cm at teeth) cm Tube secured with: Tape Dental Injury: Teeth and Oropharynx as per pre-operative assessment

## 2014-05-23 NOTE — Anesthesia Preprocedure Evaluation (Addendum)
Anesthesia Evaluation  Patient identified by MRN, date of birth, ID band Patient awake    Reviewed: Allergy & Precautions, NPO status , Patient's Chart, lab work & pertinent test results, reviewed documented beta blocker date and time   Airway Mallampati: II  TM Distance: >3 FB Neck ROM: Full    Dental  (+) Partial Upper, Dental Advisory Given   Pulmonary sleep apnea , former smoker,  Denies home O2 or CPAP   Pulmonary exam normal       Cardiovascular hypertension, Pt. on home beta blockers and Pt. on medications + CAD Normal cardiovascular exam    Neuro/Psych Anxiety Depression TIA   GI/Hepatic GERD-  ,  Endo/Other  diabetes, Well Controlled, Type 2, Oral Hypoglycemic AgentsMorbid obesity  Renal/GU      Musculoskeletal  (+) Arthritis -, Fibromyalgia -  Abdominal   Peds  Hematology   Anesthesia Other Findings Sarcoidosis.  Took self off prednisone 2 yrs ago.  Reproductive/Obstetrics                           Anesthesia Physical Anesthesia Plan  ASA: III  Anesthesia Plan: General   Post-op Pain Management:    Induction: Intravenous  Airway Management Planned: LMA and Oral ETT  Additional Equipment:   Intra-op Plan:   Post-operative Plan: Extubation in OR  Informed Consent: I have reviewed the patients History and Physical, chart, labs and discussed the procedure including the risks, benefits and alternatives for the proposed anesthesia with the patient or authorized representative who has indicated his/her understanding and acceptance.     Plan Discussed with: CRNA, Anesthesiologist and Surgeon  Anesthesia Plan Comments:         Anesthesia Quick Evaluation

## 2014-05-23 NOTE — Anesthesia Postprocedure Evaluation (Signed)
  Anesthesia Post-op Note  Patient: [name]  Procedure(s) Performed: Procedure(s): CARPAL TUNNEL RELEASE (Left)  Patient Location: PACU  Anesthesia Type:General  Level of Consciousness: awake, alert , oriented and patient cooperative  Airway and Oxygen Therapy: Patient Spontanous Breathing  Post-op Pain: mild  Post-op Assessment: Post-op Vital signs reviewed, Patient's Cardiovascular Status Stable, Respiratory Function Stable, Patent Airway, No signs of Nausea or vomiting and Pain level controlled  Post-op Vital Signs: stable  Last Vitals:  Filed Vitals:   05/23/14 1316  BP: 107/61  Pulse: 69  Temp:   Resp: 13    Complications: No apparent anesthesia complications

## 2014-05-23 NOTE — Transfer of Care (Signed)
Immediate Anesthesia Transfer of Care Note  Patient: [name]  Procedure(s) Performed: Procedure(s): CARPAL TUNNEL RELEASE (Left)  Patient Location: PACU  Anesthesia Type:General  Level of Consciousness: awake and patient cooperative  Airway & Oxygen Therapy: Patient Spontanous Breathing and Patient connected to nasal cannula oxygen  Post-op Assessment: Report given to RN, Post -op Vital signs reviewed and stable and Patient moving all extremities  Post vital signs: Reviewed and stable  Last Vitals:  Filed Vitals:   05/23/14 0913  BP: 122/70  Pulse: 72  Temp: 36.8 C  Resp: 18    Complications: No apparent anesthesia complications

## 2014-05-23 NOTE — Brief Op Note (Signed)
05/23/2014  12:48 PM  PATIENT:  [name]  54 y.o. female  PRE-OPERATIVE DIAGNOSIS:  LEFT CARPAL TUNNEL SYNDROME  POST-OPERATIVE DIAGNOSIS:  LEFT CARPAL TUNNEL SYNDROME  PROCEDURE:  Procedure(s): CARPAL TUNNEL RELEASE  SURGEON:  Surgeon(s): Meredith Pel, MD  ASSISTANT: April green rnfa  ANESTHESIA:   general  EBL: 1 ml    Total I/O In: 1050 [I.V.:1050] Out: -   BLOOD ADMINISTERED: none  DRAINS: none   LOCAL MEDICATIONS USED:  none  SPECIMEN:  No Specimen  COUNTS:  YES  TOURNIQUET:   Total Tourniquet Time Documented: Upper Arm (Left) - 11 minutes Total: Upper Arm (Left) - 11 minutes   DICTATION: .Other Dictation: Dictation Number [id number]  PLAN OF CARE: Discharge to home after PACU  PATIENT DISPOSITION:  PACU - hemodynamically stable

## 2014-05-23 NOTE — H&P (Signed)
[name] is an 54 y.o. female.   Chief Complaint: Left wrist pain HPI: [name] is a 54 year old patient with left wrist pain numbness and tingling. Interfere with her activities of daily living. She reports numbness tingling pain in the median innervated digits on the palmar aspect of the hand for some pain radiating up the forearm. Denies much in the way of neck pain. She has tried wrist splints and other conservative measures which have failed. Symptoms of been ongoing now for 6 months or more. The pain does wake her from sleep at night.  Past Medical History  Diagnosis Date  . Osteoarthritis   . Sarcoidosis     Dr. Lolita Patella  . Anxiety   . Vitamin D deficiency     is supposed to take Vit D but can't afford it  . Allergic rhinitis   . ALLERGIC RHINITIS 10/26/2009  . ANXIETY 08/14/2006  . B12 DEFICIENCY 08/25/2007  . VITAMIN D DEFICIENCY 08/25/2007  . Hypertension     takes Coreg daily  . HYPERTENSION 08/27/2007  . DYSPNEA 04/28/2009    with exertion  . Shortness of breath   . Pneumonia     over 30 yrs ago  . Headache     last migraine 2-42yr ago;takes Topamax daily  . Confusion   . Short-term memory loss   . Long-term memory loss   . TIA (transient ischemic attack)   . Joint pain   . Joint swelling   . Rheumatoid arthritis   . Osteoarthritis   . Lichen planus   . Insomnia     takes Nortriptyline nightly   . Esophageal reflux     takes Nexium daily  . Depression     takes Lexapro daily  . DEPRESSION 08/14/2006  . Urinary urgency   . Nocturia   . Diabetes mellitus without complication     was on insulin but has been off since Nov 2015 and now only takes Metformin daily  . OSA (obstructive sleep apnea)     doesn't use CPAP;sleep study in epic from 2006  . History of shingles     Past Surgical History  Procedure Laterality Date  . Arthroscopic knee surgery Right 11-12-04  . Appendectomy    . Endometrial ablation    . Axillary abcess irrigation and  debridement  Jul & Aug2012  . Cyst removed from top of buttocks  at age 54   Family History  Problem Relation Age of Onset  . Hypertension    . Coronary artery disease      female 1st degree relative <60  . Heart failure      congestive  . Coronary artery disease      Female 1st degree relative <50  . Breast cancer      1st degree relative <50 S  . Heart disease Mother   . Kidney disease Mother   . Cancer Father     leukemia   Social History:  reports that she has quit smoking. Her smoking use included Cigarettes. She smoked 0.00 packs per day. She does not have any smokeless tobacco history on file. She reports that she does not drink alcohol or use illicit drugs.  Allergies:  Allergies  Allergen Reactions  . Enalapril Maleate     REACTION: cough  . Hydrochlorothiazide     REACTION: Low potassium  . Hydroxychloroquine Sulfate     REACTION: vision changes  . Iodine     Mother was intolerant--CODED on CT table---pt never  tired.  . Latex     Hives    Medications Prior to Admission  Medication Sig Dispense Refill  . carvedilol (COREG) 12.5 MG tablet TAKE 1 TABLET BY MOUTH TWICE DAILY WITH MEALS 180 tablet 2  . clobetasol (TEMOVATE) 0.05 % external solution Apply 1 application topically 2 (two) times daily.  3  . clobetasol cream (TEMOVATE) 6.60 % Apply 1 application topically 2 (two) times daily. Bid x 1 wk, stop 1-2 weeks & restart & cont cycle    . diazepam (VALIUM) 10 MG tablet Take 2.5 mg by mouth daily as needed (before procedures).   0  . escitalopram (LEXAPRO) 20 MG tablet TAKE 1 TABLET BY MOUTH DAILY 90 tablet 1  . FREESTYLE LITE test strip TEST TWICE DAILY 100 each 3  . Lancets (FREESTYLE) lancets 1 each by Other route 2 (two) times daily. Use as instructed 100 each 12  . metFORMIN (GLUCOPHAGE-XR) 500 MG 24 hr tablet Take 500 mg by mouth daily with breakfast.    . morphine (MS CONTIN) 15 MG 12 hr tablet Take 7.5-15 mg by mouth at bedtime.  0  . NEXIUM 40 MG  capsule TAKE ONE CAPSULE BY MOUTH EVERY DAY BEFORE BREAKFAST 90 capsule 3  . nortriptyline (PAMELOR) 25 MG capsule TAKE 1 TO 2 CAPSULES BY MOUTH EVERY NIGHT AT BEDTIME AS NEEDED FOR INSOMNIA (Patient taking differently: TAKE 50 MG BY MOUTH EVERY NIGHT AT BEDTIME) 180 capsule 5  . oxyCODONE (ROXICODONE) 15 MG immediate release tablet Take 1 tablet (15 mg total) by mouth every 6 (six) hours as needed for pain. 120 tablet 0  . potassium chloride (K-DUR) 10 MEQ tablet TAKE 1 TABLET BY MOUTH DAILY 90 tablet 3  . topiramate (TOPAMAX) 50 MG tablet Take 1 tablet (50 mg total) by mouth 2 (two) times daily. 180 tablet 2  . VOLTAREN 1 % GEL Apply 2 g topically 3 (three) times daily as needed (knee pain).   5  . cholecalciferol (VITAMIN D) 1000 UNITS tablet Take 1 tablet (1,000 Units total) by mouth daily. (Patient not taking: Reported on 05/23/2014) 100 tablet 3  . Cyanocobalamin (VITAMIN B-12) 1000 MCG SUBL Place 1 tablet (1,000 mcg total) under the tongue daily. (Patient not taking: Reported on 05/23/2014) 100 tablet 3    Results for orders placed or performed during the hospital encounter of 05/23/14 (from the past 48 hour(s))  CBC     Status: None   Collection Time: 05/23/14  8:58 AM  Result Value Ref Range   WBC 6.1 4.0 - 10.5 K/uL   RBC 4.62 3.87 - 5.11 MIL/uL   Hemoglobin 14.0 12.0 - 15.0 g/dL   HCT 43.2 36.0 - 46.0 %   MCV 93.5 78.0 - 100.0 fL   MCH 30.3 26.0 - 34.0 pg   MCHC 32.4 30.0 - 36.0 g/dL   RDW 13.6 11.5 - 15.5 %   Platelets 222 150 - 400 K/uL  Basic metabolic panel     Status: Abnormal   Collection Time: 05/23/14  8:58 AM  Result Value Ref Range   Sodium 141 135 - 145 mmol/L   Potassium 3.8 3.5 - 5.1 mmol/L   Chloride 107 101 - 111 mmol/L   CO2 25 22 - 32 mmol/L   Glucose, Bld 115 (H) 70 - 99 mg/dL   BUN 10 6 - 20 mg/dL   Creatinine, Ser 1.17 (H) 0.44 - 1.00 mg/dL   Calcium 9.3 8.9 - 10.3 mg/dL   GFR calc non Af  Amer 52 (L) >60 mL/min   GFR calc Af Amer >60 >60 mL/min     Comment: (NOTE) The eGFR has been calculated using the CKD EPI equation. This calculation has not been validated in all clinical situations. eGFR's persistently <60 mL/min signify possible Chronic Kidney Disease.    Anion gap 9 5 - 15  Glucose, capillary     Status: Abnormal   Collection Time: 05/23/14  9:43 AM  Result Value Ref Range   Glucose-Capillary 108 (H) 70 - 99 mg/dL   Dg Chest 2 View  05/23/2014   CLINICAL DATA:  Preoperative evaluation for hand surgery. History of sarcoidosis  EXAM: CHEST  2 VIEW  COMPARISON:  Chest radiograph January 01, 2012; chest CT February 02, 2013  FINDINGS: Ill-defined opacities in each upper lobe and in the right base regions are stable and likely related to the history of sarcoidosis. There is no edema or consolidation. Heart is upper normal in size with pulmonary vascularity within normal limits. Mild right hilar adenopathy remain stable. There is no new adenopathy. No bone lesions are appreciable.  IMPRESSION: Stable changes that are felt to be secondary to history of sarcoidosis. No frank edema or consolidation. No new adenopathy.   Electronically Signed   By: Lowella Grip III M.D.   On: 05/23/2014 09:44    Review of Systems  Constitutional: Negative.   HENT: Negative.   Eyes: Negative.   Respiratory: Negative.   Cardiovascular: Negative.   Gastrointestinal: Negative.   Genitourinary: Negative.   Musculoskeletal: Positive for joint pain.  Skin: Positive for rash.  Neurological: Negative.   Endo/Heme/Allergies: Negative.   Psychiatric/Behavioral: Negative.     Blood pressure 122/70, pulse 72, temperature 98.2 F (36.8 C), temperature source Oral, resp. rate 18, height 5' 7" (1.702 m), weight 130.182 kg (287 lb), SpO2 95 %. Physical Exam  Constitutional: She appears well-developed.  HENT:  Head: Normocephalic.  Eyes: Conjunctivae are normal.  Neck: Normal range of motion.  Cardiovascular: Normal rate.   Respiratory: Effort normal.   Neurological: She is alert.  Skin: Skin is warm.  Psychiatric: She has a normal mood and affect.   left wrist demonstrates intact skin not much wasting of abductor pollicis brevis grip strength intact carpal tunnel compression testing positive negative Tinel's in the cubital tunnel of the elbow wrist range of motion is full  Assessment/Plan Impression is left wrist carpal tunnel syndrome moderate to severe by EMG nerve study testing refractory to nonoperative management plan open carpal tunnel release risk and benefits discussed with the patient limited limited to infection or vessel damage wrist stiffness pillar pain all questions answered patient does have a lot of medical comorbidities which do increase the risk of this. Nonetheless those medical comorbidities are optimized.  DEAN,GREGORY SCOTT 05/23/2014, 11:55 AM

## 2014-05-24 ENCOUNTER — Encounter (HOSPITAL_COMMUNITY): Payer: Self-pay | Admitting: Orthopedic Surgery

## 2014-05-24 ENCOUNTER — Ambulatory Visit: Payer: Medicare Other | Admitting: Pulmonary Disease

## 2014-05-24 NOTE — Op Note (Signed)
NAMECOURTNEE, [name] NO.:  [id number]  MEDICAL RECORD NO.:  [id number]  LOCATION:  [facility]                         FACILITY:  [facility]  PHYSICIAN:  Anderson Malta, M.D.    DATE OF BIRTH:  12-Nov-1960  DATE OF PROCEDURE:  05/23/2014 DATE OF DISCHARGE:  05/23/2014                              OPERATIVE REPORT   PREOPERATIVE DIAGNOSIS:  Left carpal tunnel syndrome.  POSTOPERATIVE DIAGNOSIS:  Left carpal tunnel syndrome.  PROCEDURE:  Left carpal tunnel release.  SURGEON:  Anderson Malta, M.D.  ASSISTANT:  April Green, RNFA.  INDICATIONS:  [name] is a patient with left wrist pain, presents for operative management after explanation of risks and benefits.  EMG nerve study positive for significant carpal tunnel syndrome.  DESCRIPTION OF PROCEDURE:  The patient was brought to the operating room where general endotracheal anesthesia was induced.  Preoperative antibiotics were administered.  Time-out was called.  Left hand was prescrubbed with alcohol and Betadine, allowed to air dry, prepped with DuraPrep solution and draped in a sterile manner.  Time-out was called. Arm was elevated and exsanguinated with Esmarch wrap.  Total tourniquet time 11 minutes at 250 mmHg.  Incision was made at the intersection of Kaplan cardinal line on the radial border of the fourth finger, taken down the proximal wrist flexion crease.  Skin and subcutaneous tissue were sharply divided.  The palmar fascia was visualized and divided. Transverse carpal ligament was divided along its midsection at 2 to 3 mm.  Right angle retractor was then placed between the transverse carpal ligament and the median nerve.  This was then released distally to the ulnar neurovascular bundle proximally to the forearm fascia under direct visualization.  Motor branch intact.  Thorough irrigation performed. Skin edges anesthetized with Marcaine.  Tourniquet released.  Bleeding points were encountered and controlled  with electrocautery.  Incision was closed using 3-0 nylon sutures.  Bulky splint was applied.  The patient tolerated the procedure well without immediate complications, transferred to the recovery room in stable condition.     Anderson Malta, M.D.     GSD/MEDQ  D:  05/23/2014  T:  05/24/2014  Job:  [id number]

## 2014-05-30 ENCOUNTER — Ambulatory Visit: Payer: Medicare Other

## 2014-06-01 ENCOUNTER — Encounter: Payer: Self-pay | Admitting: *Deleted

## 2014-06-03 ENCOUNTER — Telehealth: Payer: Self-pay | Admitting: Pulmonary Disease

## 2014-06-03 NOTE — Telephone Encounter (Signed)
Notes Recorded by Kathee Delton, MD on 05/24/2014 at 11:04 AM Mindy, this pt was supposed to see me back and has not returned. Please get her in for OV to review breathing studies before I leave. Thanks. ------------------- Pt scheduled for next Friday at 11:00 with [place].  Nothing further needed.

## 2014-06-10 ENCOUNTER — Ambulatory Visit: Payer: Medicare Other | Admitting: Pulmonary Disease

## 2014-06-16 DIAGNOSIS — Z79899 Other long term (current) drug therapy: Secondary | ICD-10-CM | POA: Diagnosis not present

## 2014-06-16 DIAGNOSIS — M150 Primary generalized (osteo)arthritis: Secondary | ICD-10-CM | POA: Diagnosis not present

## 2014-06-16 DIAGNOSIS — M4697 Unspecified inflammatory spondylopathy, lumbosacral region: Secondary | ICD-10-CM | POA: Diagnosis not present

## 2014-06-16 DIAGNOSIS — M47817 Spondylosis without myelopathy or radiculopathy, lumbosacral region: Secondary | ICD-10-CM | POA: Diagnosis not present

## 2014-06-16 DIAGNOSIS — G894 Chronic pain syndrome: Secondary | ICD-10-CM | POA: Diagnosis not present

## 2014-06-16 DIAGNOSIS — M545 Low back pain: Secondary | ICD-10-CM | POA: Diagnosis not present

## 2014-06-28 ENCOUNTER — Other Ambulatory Visit (INDEPENDENT_AMBULATORY_CARE_PROVIDER_SITE_OTHER): Payer: Medicare Other

## 2014-06-28 ENCOUNTER — Encounter: Payer: Self-pay | Admitting: Internal Medicine

## 2014-06-28 ENCOUNTER — Ambulatory Visit (INDEPENDENT_AMBULATORY_CARE_PROVIDER_SITE_OTHER): Payer: Medicare Other | Admitting: Internal Medicine

## 2014-06-28 VITALS — BP 132/70 | HR 83 | Temp 97.4°F | Ht 67.0 in | Wt 256.2 lb

## 2014-06-28 DIAGNOSIS — E1165 Type 2 diabetes mellitus with hyperglycemia: Secondary | ICD-10-CM | POA: Diagnosis not present

## 2014-06-28 DIAGNOSIS — E669 Obesity, unspecified: Secondary | ICD-10-CM | POA: Diagnosis not present

## 2014-06-28 DIAGNOSIS — Z23 Encounter for immunization: Secondary | ICD-10-CM | POA: Diagnosis not present

## 2014-06-28 DIAGNOSIS — E538 Deficiency of other specified B group vitamins: Secondary | ICD-10-CM

## 2014-06-28 DIAGNOSIS — I2510 Atherosclerotic heart disease of native coronary artery without angina pectoris: Secondary | ICD-10-CM | POA: Diagnosis not present

## 2014-06-28 DIAGNOSIS — F331 Major depressive disorder, recurrent, moderate: Secondary | ICD-10-CM

## 2014-06-28 DIAGNOSIS — IMO0002 Reserved for concepts with insufficient information to code with codable children: Secondary | ICD-10-CM

## 2014-06-28 DIAGNOSIS — R5383 Other fatigue: Secondary | ICD-10-CM

## 2014-06-28 DIAGNOSIS — I10 Essential (primary) hypertension: Secondary | ICD-10-CM

## 2014-06-28 LAB — BASIC METABOLIC PANEL
BUN: 10 mg/dL (ref 6–23)
CO2: 25 mEq/L (ref 19–32)
Calcium: 9.7 mg/dL (ref 8.4–10.5)
Chloride: 104 mEq/L (ref 96–112)
Creatinine, Ser: 1.02 mg/dL (ref 0.40–1.20)
GFR: 60.04 mL/min (ref >60.00)
GLUCOSE: 125 mg/dL — AB (ref 70–99)
Potassium: 3.7 mEq/L (ref 3.5–5.1)
Sodium: 138 mEq/L (ref 135–145)

## 2014-06-28 LAB — TSH: TSH: 1.77 u[IU]/mL (ref 0.35–4.50)

## 2014-06-28 LAB — T4, FREE: Free T4: 0.81 ng/dL (ref 0.60–1.60)

## 2014-06-28 LAB — HEMOGLOBIN A1C: Hgb A1c MFr Bld: 6.2 % (ref 4.6–6.5)

## 2014-06-28 MED ORDER — CYANOCOBALAMIN 1000 MCG/ML IJ SOLN
1000.0000 ug | Freq: Once | INTRAMUSCULAR | Status: AC
  Administered 2014-06-28: 1000 ug via INTRAMUSCULAR

## 2014-06-28 MED ORDER — TETANUS-DIPHTH-ACELL PERTUSSIS 5-2.5-18.5 LF-MCG/0.5 IM SUSP: 0.5000 mL | Freq: Once | INTRAMUSCULAR | Status: DC

## 2014-06-28 NOTE — Progress Notes (Signed)
Subjective:    Patient ID: [name], female    DOB: 1960/04/12, 54 y.o.   MRN: [id number]  HPI   [name] lost wt after she stopped prednisone in 2014 C/o unresolved grief - mother died 6 years Pt had L CTS surgery  Pt is s/p a new Rhematology eval by Dr [name] and a Pain Clinic eval by Dr [name] - she had a radiofrequency treatment on the left  F/u not being able to walk right - "I don't know which foot to put first"  x 4 months. F/u memory issues  Rash is worse since Dr [name] was tapering off steroids ??Lichen vs sarcoid: no Prenisone since Dec 2014  F/u dizzy spells, weakness, blurred vision since 2 wks ago.  A little better. CBG was ok F/u B L>R knee pain - seeing Ortho Dr [name] Pt was planning a TKR R in 8/14 - put on hold - trying to loose wt... F/u wt gain  The patient presents for a follow-up of  chronic hypertension, chronic dyslipidemia, type 2 diabetes controlled with medicines; possible CAD F/u rash on face F/u sarcoid - Dr [name] thought it was inactive - tapering off prednisone - now is on 10 mg/d  F/u R knee pain -- shots helped - Dr [name]. They are considering TKR   BP Readings from Last 3 Encounters:  06/28/14 132/70  05/23/14 134/61  03/24/14 140/72   Wt Readings from Last 3 Encounters:  06/28/14 256 lb 4 oz (116.234 kg)  05/23/14 287 lb (130.182 kg)  03/24/14 287 lb (130.182 kg)      Review of Systems  Constitutional: Positive for unexpected weight change. Negative for chills, activity change, appetite change and fatigue.  HENT: Negative for congestion, mouth sores and sinus pressure.   Eyes: Negative for visual disturbance.  Respiratory: Negative for cough and chest tightness.   Gastrointestinal: Negative for nausea, abdominal pain and diarrhea.  Genitourinary: Negative for frequency, difficulty urinating and vaginal pain.  Musculoskeletal: Positive for back pain, arthralgias, neck pain and neck stiffness. Negative for gait  problem.  Skin: Positive for rash and wound. Negative for pallor.  Neurological: Negative for dizziness, tremors, weakness, numbness and headaches.  Psychiatric/Behavioral: Positive for dysphoric mood. Negative for suicidal ideas, behavioral problems, confusion, sleep disturbance and agitation. The patient is nervous/anxious.        Objective:   Physical Exam  Constitutional: She appears well-developed. No distress.  HENT:  Head: Normocephalic.  Right Ear: External ear normal.  Left Ear: External ear normal.  Nose: Nose normal.  Mouth/Throat: Oropharynx is clear and moist.  Eyes: Conjunctivae are normal. Pupils are equal, round, and reactive to light. Right eye exhibits no discharge. Left eye exhibits no discharge.  Neck: Normal range of motion. Neck supple. No JVD present. No tracheal deviation present. No thyromegaly present.  Cardiovascular: Normal rate, regular rhythm and normal heart sounds.   Pulmonary/Chest: No stridor. No respiratory distress. She has no wheezes.  Abdominal: Soft. Bowel sounds are normal. She exhibits no distension and no mass. There is no tenderness. There is no rebound and no guarding.  Musculoskeletal: She exhibits no edema or tenderness.  Lymphadenopathy:    She has no cervical adenopathy.  Neurological: She displays normal reflexes. No cranial nerve deficit. She exhibits normal muscle tone. Coordination normal.  Skin: No rash noted. No erythema.  Psychiatric: Her behavior is normal. Judgment and thought content normal.  Tearful  Cane Lichen rash on arms, R knee   Lab Results  Component Value Date   WBC 6.1 05/23/2014   HGB 14.0 05/23/2014   HCT 43.2 05/23/2014   PLT 222 05/23/2014   GLUCOSE 115* 05/23/2014   CHOL 193 06/29/2008   TRIG 145.0 06/29/2008   HDL 67.50 06/29/2008   LDLCALC 97 06/29/2008   ALT 23 03/25/2014   AST 28 03/25/2014   NA 141 05/23/2014   K 3.8 05/23/2014   CL 107 05/23/2014   CREATININE 1.17* 05/23/2014   BUN 10  05/23/2014   CO2 25 05/23/2014   TSH 4.98* 03/24/2014   INR 1.01 10/01/2010   HGBA1C 6.8* 03/24/2014   MICROALBUR 0.5 03/03/2012        Assessment & Plan:

## 2014-06-28 NOTE — Assessment & Plan Note (Addendum)
Labs Dr Loanne Drilling On Metformin  Eye check

## 2014-06-28 NOTE — Assessment & Plan Note (Signed)
Chronic - aggravated by grief x 6 years since mom's death in May 08, 2010 Psychol ref

## 2014-06-28 NOTE — Progress Notes (Signed)
Pre visit review using our clinic review tool, if applicable. No additional management support is needed unless otherwise documented below in the visit note. 

## 2014-06-28 NOTE — Assessment & Plan Note (Signed)
Wt Readings from Last 3 Encounters:  06/28/14 256 lb 4 oz (116.234 kg)  05/23/14 287 lb (130.182 kg)  03/24/14 287 lb (130.182 kg)

## 2014-07-03 ENCOUNTER — Other Ambulatory Visit: Payer: Self-pay | Admitting: Internal Medicine

## 2014-07-07 ENCOUNTER — Ambulatory Visit: Payer: Medicare Other | Admitting: Neurology

## 2014-07-12 ENCOUNTER — Telehealth: Payer: Self-pay | Admitting: Endocrinology

## 2014-07-12 MED ORDER — FREESTYLE LITE DEVI: Status: DC

## 2014-07-12 MED ORDER — FREESTYLE LANCETS MISC: 1.0000 | Freq: Two times a day (BID) | Status: DC

## 2014-07-12 MED ORDER — GLUCOSE BLOOD VI STRP: ORAL_STRIP | Status: DC

## 2014-07-12 NOTE — Telephone Encounter (Signed)
Pt needs refill on her strips and the needles for her monitor she has a freestyle lite meter and a new rx for a new meter because hers is broken

## 2014-07-12 NOTE — Telephone Encounter (Signed)
Rx sent for a 30 day supply. Pt needs an appointment for further refills. Letter mailed to the pt.

## 2014-08-01 ENCOUNTER — Encounter: Payer: Self-pay | Admitting: Endocrinology

## 2014-08-01 ENCOUNTER — Ambulatory Visit (INDEPENDENT_AMBULATORY_CARE_PROVIDER_SITE_OTHER): Payer: Medicare Other | Admitting: Endocrinology

## 2014-08-01 VITALS — BP 112/74 | HR 89 | Temp 97.9°F | Ht 67.0 in | Wt 247.0 lb

## 2014-08-01 DIAGNOSIS — E1165 Type 2 diabetes mellitus with hyperglycemia: Secondary | ICD-10-CM | POA: Diagnosis not present

## 2014-08-01 DIAGNOSIS — IMO0002 Reserved for concepts with insufficient information to code with codable children: Secondary | ICD-10-CM

## 2014-08-01 DIAGNOSIS — I2510 Atherosclerotic heart disease of native coronary artery without angina pectoris: Secondary | ICD-10-CM

## 2014-08-01 NOTE — Progress Notes (Signed)
Subjective:    Patient ID: [name], female    DOB: 1960/03/08, 54 y.o.   MRN: [id number]  HPI Pt returns for f/u of diabetes mellitus: DM type: 2 Dx'ed: 7408 Complications: polyneuropathy Therapy: metformin GDM: never DKA: never Severe hypoglycemia: never Pancreatitis: never Other: she took insulin from 2013-2015; She intermittently takes prednisone, for sarcoidosis; she cannot undergo weight-loss surgery, as she has medicaid.   Interval history: since last ov here, she has lost 85 lbs, due to her efforts.   Past Medical History  Diagnosis Date  . Osteoarthritis   . Sarcoidosis     Dr. Lolita Patella  . Anxiety   . Vitamin D deficiency     is supposed to take Vit D but can't afford it  . Allergic rhinitis   . ALLERGIC RHINITIS 10/26/2009  . ANXIETY 08/14/2006  . B12 DEFICIENCY 08/25/2007  . VITAMIN D DEFICIENCY 08/25/2007  . Hypertension     takes Coreg daily  . HYPERTENSION 08/27/2007  . DYSPNEA 04/28/2009    with exertion  . Shortness of breath   . Pneumonia     over 30 yrs ago  . Headache     last migraine 2-103yrs ago;takes Topamax daily  . Confusion   . Short-term memory loss   . Long-term memory loss   . TIA (transient ischemic attack)   . Joint pain   . Joint swelling   . Rheumatoid arthritis   . Osteoarthritis   . Lichen planus   . Insomnia     takes Nortriptyline nightly   . Esophageal reflux     takes Nexium daily  . Depression     takes Lexapro daily  . DEPRESSION 08/14/2006  . Urinary urgency   . Nocturia   . Diabetes mellitus without complication     was on insulin but has been off since Nov 2015 and now only takes Metformin daily  . OSA (obstructive sleep apnea)     doesn't use CPAP;sleep study in epic from 2006  . History of shingles     Past Surgical History  Procedure Laterality Date  . Arthroscopic knee surgery Right 11-12-04  . Appendectomy    . Endometrial ablation    . Axillary abcess irrigation and debridement  Jul &  Aug2012  . Cyst removed from top of buttocks  at age 79  . Carpal tunnel release Left 05/23/2014    Procedure: CARPAL TUNNEL RELEASE;  Surgeon: Meredith Pel, MD;  Location: [facility];  Service: Orthopedics;  Laterality: Left;    History   Social History  . Marital Status: Single    Spouse Name: N/A  . Number of Children: N/A  . Years of Education: N/A   Occupational History  . disabled    Social History Main Topics  . Smoking status: Former Smoker -- 0.00 packs/day    Types: Cigarettes  . Smokeless tobacco: Not on file     Comment: quit smoking in 2004  . Alcohol Use: No  . Drug Use: No  . Sexual Activity: Not Currently   Other Topics Concern  . Not on file   Social History Narrative   Single, broke up with partner in 2008.    Current Outpatient Prescriptions on File Prior to Visit  Medication Sig Dispense Refill  . Blood Glucose Monitoring Suppl (FREESTYLE LITE) DEVI Use to check blood sugar 2 times per day 1 each 0  . carvedilol (COREG) 12.5 MG tablet TAKE 1 TABLET BY MOUTH TWICE DAILY WITH  MEALS 180 tablet 2  . cholecalciferol (VITAMIN D) 1000 UNITS tablet Take 1 tablet (1,000 Units total) by mouth daily. 100 tablet 3  . clobetasol (TEMOVATE) 0.05 % external solution Apply 1 application topically 2 (two) times daily.  3  . escitalopram (LEXAPRO) 20 MG tablet TAKE 1 TABLET BY MOUTH DAILY 90 tablet 3  . glucose blood (FREESTYLE LITE) test strip TEST TWICE DAILY 100 each 3  . Lancets (FREESTYLE) lancets 1 each by Other route 2 (two) times daily. Use as instructed 100 each 12  . metFORMIN (GLUCOPHAGE-XR) 500 MG 24 hr tablet Take 500 mg by mouth daily with breakfast.    . morphine (MS CONTIN) 15 MG 12 hr tablet Take 7.5-15 mg by mouth at bedtime.  0  . NEXIUM 40 MG capsule TAKE ONE CAPSULE BY MOUTH EVERY DAY BEFORE BREAKFAST 90 capsule 3  . nortriptyline (PAMELOR) 25 MG capsule TAKE 1 TO 2 CAPSULES BY MOUTH EVERY NIGHT AT BEDTIME AS NEEDED FOR INSOMNIA (Patient taking  differently: TAKE 50 MG BY MOUTH EVERY NIGHT AT BEDTIME) 180 capsule 5  . oxyCODONE (ROXICODONE) 15 MG immediate release tablet Take 1 tablet (15 mg total) by mouth every 6 (six) hours as needed for pain. 120 tablet 0  . potassium chloride (K-DUR) 10 MEQ tablet TAKE 1 TABLET BY MOUTH DAILY 90 tablet 3  . topiramate (TOPAMAX) 50 MG tablet Take 1 tablet (50 mg total) by mouth 2 (two) times daily. 180 tablet 2  . VOLTAREN 1 % GEL Apply 2 g topically 3 (three) times daily as needed (knee pain).   5  . diazepam (VALIUM) 10 MG tablet Take 2.5 mg by mouth daily as needed (before procedures).   0  . [DISCONTINUED] potassium chloride (K-DUR,KLOR-CON) 10 MEQ tablet TAKE 1 TABLET BY MOUTH DAILY 90 tablet 2   No current facility-administered medications on file prior to visit.    Allergies  Allergen Reactions  . Enalapril Maleate     REACTION: cough  . Hydrochlorothiazide     REACTION: Low potassium  . Hydroxychloroquine Sulfate     REACTION: vision changes  . Iodine     Mother was intolerant--CODED on CT table---pt never tired.  . Latex     Hives    Family History  Problem Relation Age of Onset  . Hypertension    . Coronary artery disease      female 1st degree relative <60  . Heart failure      congestive  . Coronary artery disease      Female 1st degree relative <50  . Breast cancer      1st degree relative <50 S  . Heart disease Mother   . Kidney disease Mother   . Cancer Father     leukemia    BP 112/74 mmHg  Pulse 89  Temp(Src) 97.9 F (36.6 C) (Oral)  Ht 5\' 7"  (1.702 m)  Wt 247 lb (112.038 kg)  BMI 38.68 kg/m2  SpO2 96%     Review of Systems Denies n/v/d    Objective:   Physical Exam VITAL SIGNS:  See vs page GENERAL: no distress Pulses: dorsalis pedis intact bilat.   MSK: no deformity of the feet CV: trace bilat leg edema Skin:  no ulcer on the feet.  normal temp on the feet, but there is a patchy rash (lichen planus).   Neuro: sensation is intact to touch  on the feet, but decreased from normal.      Lab Results  Component  Value Date   HGBA1C 6.2 06/28/2014      Assessment & Plan:  DM: well-controlled, due to weight loss.    Patient is advised the following: Patient Instructions  Please continue the same metformin.  Please come back for a follow-up appointment in 6 months.  check your blood sugar once a day.  vary the time of day when you check, between before the 3 meals, and at bedtime.  also check if you have symptoms of your blood sugar being too high or too low.  please keep a record of the readings and bring it to your next appointment here.  You can write it on any piece of paper.  please call us sooner if your blood sugar goes below 70, or if you have a lot of readings over 200.

## 2014-08-01 NOTE — Patient Instructions (Addendum)
Please continue the same metformin.  Please come back for a follow-up appointment in 6 months.  check your blood sugar once a day.  vary the time of day when you check, between before the 3 meals, and at bedtime.  also check if you have symptoms of your blood sugar being too high or too low.  please keep a record of the readings and bring it to your next appointment here.  You can write it on any piece of paper.  please call us sooner if your blood sugar goes below 70, or if you have a lot of readings over 200.

## 2014-08-03 DIAGNOSIS — M25532 Pain in left wrist: Secondary | ICD-10-CM | POA: Diagnosis not present

## 2014-08-09 DIAGNOSIS — M1990 Unspecified osteoarthritis, unspecified site: Secondary | ICD-10-CM | POA: Diagnosis not present

## 2014-08-09 DIAGNOSIS — I10 Essential (primary) hypertension: Secondary | ICD-10-CM | POA: Diagnosis not present

## 2014-08-09 DIAGNOSIS — D869 Sarcoidosis, unspecified: Secondary | ICD-10-CM | POA: Diagnosis not present

## 2014-08-09 DIAGNOSIS — E119 Type 2 diabetes mellitus without complications: Secondary | ICD-10-CM | POA: Diagnosis not present

## 2014-08-09 DIAGNOSIS — Z4789 Encounter for other orthopedic aftercare: Secondary | ICD-10-CM | POA: Diagnosis not present

## 2014-08-09 DIAGNOSIS — M069 Rheumatoid arthritis, unspecified: Secondary | ICD-10-CM | POA: Diagnosis not present

## 2014-08-12 DIAGNOSIS — M1990 Unspecified osteoarthritis, unspecified site: Secondary | ICD-10-CM | POA: Diagnosis not present

## 2014-08-12 DIAGNOSIS — Z4789 Encounter for other orthopedic aftercare: Secondary | ICD-10-CM | POA: Diagnosis not present

## 2014-08-12 DIAGNOSIS — M069 Rheumatoid arthritis, unspecified: Secondary | ICD-10-CM | POA: Diagnosis not present

## 2014-08-12 DIAGNOSIS — I10 Essential (primary) hypertension: Secondary | ICD-10-CM | POA: Diagnosis not present

## 2014-08-12 DIAGNOSIS — D869 Sarcoidosis, unspecified: Secondary | ICD-10-CM | POA: Diagnosis not present

## 2014-08-12 DIAGNOSIS — E119 Type 2 diabetes mellitus without complications: Secondary | ICD-10-CM | POA: Diagnosis not present

## 2014-08-15 DIAGNOSIS — D869 Sarcoidosis, unspecified: Secondary | ICD-10-CM | POA: Diagnosis not present

## 2014-08-15 DIAGNOSIS — Z4789 Encounter for other orthopedic aftercare: Secondary | ICD-10-CM | POA: Diagnosis not present

## 2014-08-15 DIAGNOSIS — M1990 Unspecified osteoarthritis, unspecified site: Secondary | ICD-10-CM | POA: Diagnosis not present

## 2014-08-15 DIAGNOSIS — I10 Essential (primary) hypertension: Secondary | ICD-10-CM | POA: Diagnosis not present

## 2014-08-15 DIAGNOSIS — E119 Type 2 diabetes mellitus without complications: Secondary | ICD-10-CM | POA: Diagnosis not present

## 2014-08-15 DIAGNOSIS — M069 Rheumatoid arthritis, unspecified: Secondary | ICD-10-CM | POA: Diagnosis not present

## 2014-08-16 DIAGNOSIS — Z4789 Encounter for other orthopedic aftercare: Secondary | ICD-10-CM | POA: Diagnosis not present

## 2014-08-16 DIAGNOSIS — I10 Essential (primary) hypertension: Secondary | ICD-10-CM | POA: Diagnosis not present

## 2014-08-16 DIAGNOSIS — E119 Type 2 diabetes mellitus without complications: Secondary | ICD-10-CM | POA: Diagnosis not present

## 2014-08-16 DIAGNOSIS — M069 Rheumatoid arthritis, unspecified: Secondary | ICD-10-CM | POA: Diagnosis not present

## 2014-08-16 DIAGNOSIS — M1990 Unspecified osteoarthritis, unspecified site: Secondary | ICD-10-CM | POA: Diagnosis not present

## 2014-08-16 DIAGNOSIS — D869 Sarcoidosis, unspecified: Secondary | ICD-10-CM | POA: Diagnosis not present

## 2014-08-17 DIAGNOSIS — M1990 Unspecified osteoarthritis, unspecified site: Secondary | ICD-10-CM | POA: Diagnosis not present

## 2014-08-17 DIAGNOSIS — Z4789 Encounter for other orthopedic aftercare: Secondary | ICD-10-CM | POA: Diagnosis not present

## 2014-08-17 DIAGNOSIS — M069 Rheumatoid arthritis, unspecified: Secondary | ICD-10-CM | POA: Diagnosis not present

## 2014-08-17 DIAGNOSIS — D869 Sarcoidosis, unspecified: Secondary | ICD-10-CM | POA: Diagnosis not present

## 2014-08-17 DIAGNOSIS — E119 Type 2 diabetes mellitus without complications: Secondary | ICD-10-CM | POA: Diagnosis not present

## 2014-08-17 DIAGNOSIS — I10 Essential (primary) hypertension: Secondary | ICD-10-CM | POA: Diagnosis not present

## 2014-08-18 DIAGNOSIS — Z4789 Encounter for other orthopedic aftercare: Secondary | ICD-10-CM | POA: Diagnosis not present

## 2014-08-18 DIAGNOSIS — M1990 Unspecified osteoarthritis, unspecified site: Secondary | ICD-10-CM | POA: Diagnosis not present

## 2014-08-18 DIAGNOSIS — E119 Type 2 diabetes mellitus without complications: Secondary | ICD-10-CM | POA: Diagnosis not present

## 2014-08-18 DIAGNOSIS — M069 Rheumatoid arthritis, unspecified: Secondary | ICD-10-CM | POA: Diagnosis not present

## 2014-08-18 DIAGNOSIS — D869 Sarcoidosis, unspecified: Secondary | ICD-10-CM | POA: Diagnosis not present

## 2014-08-18 DIAGNOSIS — I10 Essential (primary) hypertension: Secondary | ICD-10-CM | POA: Diagnosis not present

## 2014-08-19 DIAGNOSIS — D869 Sarcoidosis, unspecified: Secondary | ICD-10-CM | POA: Diagnosis not present

## 2014-08-19 DIAGNOSIS — M1990 Unspecified osteoarthritis, unspecified site: Secondary | ICD-10-CM | POA: Diagnosis not present

## 2014-08-19 DIAGNOSIS — M069 Rheumatoid arthritis, unspecified: Secondary | ICD-10-CM | POA: Diagnosis not present

## 2014-08-19 DIAGNOSIS — E119 Type 2 diabetes mellitus without complications: Secondary | ICD-10-CM | POA: Diagnosis not present

## 2014-08-19 DIAGNOSIS — I10 Essential (primary) hypertension: Secondary | ICD-10-CM | POA: Diagnosis not present

## 2014-08-19 DIAGNOSIS — Z4789 Encounter for other orthopedic aftercare: Secondary | ICD-10-CM | POA: Diagnosis not present

## 2014-08-22 DIAGNOSIS — M1990 Unspecified osteoarthritis, unspecified site: Secondary | ICD-10-CM | POA: Diagnosis not present

## 2014-08-22 DIAGNOSIS — D869 Sarcoidosis, unspecified: Secondary | ICD-10-CM | POA: Diagnosis not present

## 2014-08-22 DIAGNOSIS — I10 Essential (primary) hypertension: Secondary | ICD-10-CM | POA: Diagnosis not present

## 2014-08-22 DIAGNOSIS — Z4789 Encounter for other orthopedic aftercare: Secondary | ICD-10-CM | POA: Diagnosis not present

## 2014-08-22 DIAGNOSIS — M069 Rheumatoid arthritis, unspecified: Secondary | ICD-10-CM | POA: Diagnosis not present

## 2014-08-22 DIAGNOSIS — E119 Type 2 diabetes mellitus without complications: Secondary | ICD-10-CM | POA: Diagnosis not present

## 2014-08-24 DIAGNOSIS — M1990 Unspecified osteoarthritis, unspecified site: Secondary | ICD-10-CM | POA: Diagnosis not present

## 2014-08-24 DIAGNOSIS — D869 Sarcoidosis, unspecified: Secondary | ICD-10-CM | POA: Diagnosis not present

## 2014-08-24 DIAGNOSIS — E119 Type 2 diabetes mellitus without complications: Secondary | ICD-10-CM | POA: Diagnosis not present

## 2014-08-24 DIAGNOSIS — I10 Essential (primary) hypertension: Secondary | ICD-10-CM | POA: Diagnosis not present

## 2014-08-24 DIAGNOSIS — M069 Rheumatoid arthritis, unspecified: Secondary | ICD-10-CM | POA: Diagnosis not present

## 2014-08-24 DIAGNOSIS — Z4789 Encounter for other orthopedic aftercare: Secondary | ICD-10-CM | POA: Diagnosis not present

## 2014-08-29 DIAGNOSIS — Z4789 Encounter for other orthopedic aftercare: Secondary | ICD-10-CM | POA: Diagnosis not present

## 2014-08-29 DIAGNOSIS — E119 Type 2 diabetes mellitus without complications: Secondary | ICD-10-CM | POA: Diagnosis not present

## 2014-08-29 DIAGNOSIS — D869 Sarcoidosis, unspecified: Secondary | ICD-10-CM | POA: Diagnosis not present

## 2014-08-29 DIAGNOSIS — M1990 Unspecified osteoarthritis, unspecified site: Secondary | ICD-10-CM | POA: Diagnosis not present

## 2014-08-29 DIAGNOSIS — I10 Essential (primary) hypertension: Secondary | ICD-10-CM | POA: Diagnosis not present

## 2014-08-29 DIAGNOSIS — M069 Rheumatoid arthritis, unspecified: Secondary | ICD-10-CM | POA: Diagnosis not present

## 2014-08-30 DIAGNOSIS — E119 Type 2 diabetes mellitus without complications: Secondary | ICD-10-CM | POA: Diagnosis not present

## 2014-08-30 DIAGNOSIS — M1990 Unspecified osteoarthritis, unspecified site: Secondary | ICD-10-CM | POA: Diagnosis not present

## 2014-08-30 DIAGNOSIS — I10 Essential (primary) hypertension: Secondary | ICD-10-CM | POA: Diagnosis not present

## 2014-08-30 DIAGNOSIS — Z4789 Encounter for other orthopedic aftercare: Secondary | ICD-10-CM | POA: Diagnosis not present

## 2014-08-30 DIAGNOSIS — M069 Rheumatoid arthritis, unspecified: Secondary | ICD-10-CM | POA: Diagnosis not present

## 2014-08-30 DIAGNOSIS — D869 Sarcoidosis, unspecified: Secondary | ICD-10-CM | POA: Diagnosis not present

## 2014-08-31 DIAGNOSIS — M170 Bilateral primary osteoarthritis of knee: Secondary | ICD-10-CM | POA: Diagnosis not present

## 2014-08-31 DIAGNOSIS — D869 Sarcoidosis, unspecified: Secondary | ICD-10-CM | POA: Diagnosis not present

## 2014-08-31 DIAGNOSIS — Z4789 Encounter for other orthopedic aftercare: Secondary | ICD-10-CM | POA: Diagnosis not present

## 2014-08-31 DIAGNOSIS — G5602 Carpal tunnel syndrome, left upper limb: Secondary | ICD-10-CM | POA: Diagnosis not present

## 2014-08-31 DIAGNOSIS — I10 Essential (primary) hypertension: Secondary | ICD-10-CM | POA: Diagnosis not present

## 2014-08-31 DIAGNOSIS — M1990 Unspecified osteoarthritis, unspecified site: Secondary | ICD-10-CM | POA: Diagnosis not present

## 2014-08-31 DIAGNOSIS — M069 Rheumatoid arthritis, unspecified: Secondary | ICD-10-CM | POA: Diagnosis not present

## 2014-08-31 DIAGNOSIS — E119 Type 2 diabetes mellitus without complications: Secondary | ICD-10-CM | POA: Diagnosis not present

## 2014-09-01 DIAGNOSIS — I10 Essential (primary) hypertension: Secondary | ICD-10-CM | POA: Diagnosis not present

## 2014-09-01 DIAGNOSIS — Z4789 Encounter for other orthopedic aftercare: Secondary | ICD-10-CM | POA: Diagnosis not present

## 2014-09-01 DIAGNOSIS — E119 Type 2 diabetes mellitus without complications: Secondary | ICD-10-CM | POA: Diagnosis not present

## 2014-09-01 DIAGNOSIS — M069 Rheumatoid arthritis, unspecified: Secondary | ICD-10-CM | POA: Diagnosis not present

## 2014-09-01 DIAGNOSIS — D869 Sarcoidosis, unspecified: Secondary | ICD-10-CM | POA: Diagnosis not present

## 2014-09-01 DIAGNOSIS — M1990 Unspecified osteoarthritis, unspecified site: Secondary | ICD-10-CM | POA: Diagnosis not present

## 2014-09-02 ENCOUNTER — Other Ambulatory Visit: Payer: Self-pay | Admitting: Internal Medicine

## 2014-09-05 DIAGNOSIS — M1990 Unspecified osteoarthritis, unspecified site: Secondary | ICD-10-CM | POA: Diagnosis not present

## 2014-09-05 DIAGNOSIS — E119 Type 2 diabetes mellitus without complications: Secondary | ICD-10-CM | POA: Diagnosis not present

## 2014-09-05 DIAGNOSIS — I10 Essential (primary) hypertension: Secondary | ICD-10-CM | POA: Diagnosis not present

## 2014-09-05 DIAGNOSIS — Z4789 Encounter for other orthopedic aftercare: Secondary | ICD-10-CM | POA: Diagnosis not present

## 2014-09-05 DIAGNOSIS — Z79899 Other long term (current) drug therapy: Secondary | ICD-10-CM | POA: Diagnosis not present

## 2014-09-05 DIAGNOSIS — G894 Chronic pain syndrome: Secondary | ICD-10-CM | POA: Diagnosis not present

## 2014-09-05 DIAGNOSIS — D869 Sarcoidosis, unspecified: Secondary | ICD-10-CM | POA: Diagnosis not present

## 2014-09-05 DIAGNOSIS — M069 Rheumatoid arthritis, unspecified: Secondary | ICD-10-CM | POA: Diagnosis not present

## 2014-09-08 DIAGNOSIS — Z79899 Other long term (current) drug therapy: Secondary | ICD-10-CM | POA: Diagnosis not present

## 2014-09-08 DIAGNOSIS — M47817 Spondylosis without myelopathy or radiculopathy, lumbosacral region: Secondary | ICD-10-CM | POA: Diagnosis not present

## 2014-09-08 DIAGNOSIS — M4697 Unspecified inflammatory spondylopathy, lumbosacral region: Secondary | ICD-10-CM | POA: Diagnosis not present

## 2014-09-08 DIAGNOSIS — M150 Primary generalized (osteo)arthritis: Secondary | ICD-10-CM | POA: Diagnosis not present

## 2014-09-08 DIAGNOSIS — G894 Chronic pain syndrome: Secondary | ICD-10-CM | POA: Diagnosis not present

## 2014-09-09 DIAGNOSIS — M069 Rheumatoid arthritis, unspecified: Secondary | ICD-10-CM | POA: Diagnosis not present

## 2014-09-09 DIAGNOSIS — Z4789 Encounter for other orthopedic aftercare: Secondary | ICD-10-CM | POA: Diagnosis not present

## 2014-09-09 DIAGNOSIS — D869 Sarcoidosis, unspecified: Secondary | ICD-10-CM | POA: Diagnosis not present

## 2014-09-09 DIAGNOSIS — M1990 Unspecified osteoarthritis, unspecified site: Secondary | ICD-10-CM | POA: Diagnosis not present

## 2014-09-09 DIAGNOSIS — E119 Type 2 diabetes mellitus without complications: Secondary | ICD-10-CM | POA: Diagnosis not present

## 2014-09-09 DIAGNOSIS — I10 Essential (primary) hypertension: Secondary | ICD-10-CM | POA: Diagnosis not present

## 2014-09-12 DIAGNOSIS — M069 Rheumatoid arthritis, unspecified: Secondary | ICD-10-CM | POA: Diagnosis not present

## 2014-09-12 DIAGNOSIS — E119 Type 2 diabetes mellitus without complications: Secondary | ICD-10-CM | POA: Diagnosis not present

## 2014-09-12 DIAGNOSIS — I10 Essential (primary) hypertension: Secondary | ICD-10-CM | POA: Diagnosis not present

## 2014-09-12 DIAGNOSIS — Z4789 Encounter for other orthopedic aftercare: Secondary | ICD-10-CM | POA: Diagnosis not present

## 2014-09-12 DIAGNOSIS — M1990 Unspecified osteoarthritis, unspecified site: Secondary | ICD-10-CM | POA: Diagnosis not present

## 2014-09-12 DIAGNOSIS — D869 Sarcoidosis, unspecified: Secondary | ICD-10-CM | POA: Diagnosis not present

## 2014-09-13 ENCOUNTER — Ambulatory Visit: Payer: Medicare Other | Admitting: Neurology

## 2014-09-14 DIAGNOSIS — M1990 Unspecified osteoarthritis, unspecified site: Secondary | ICD-10-CM | POA: Diagnosis not present

## 2014-09-14 DIAGNOSIS — E119 Type 2 diabetes mellitus without complications: Secondary | ICD-10-CM | POA: Diagnosis not present

## 2014-09-14 DIAGNOSIS — Z4789 Encounter for other orthopedic aftercare: Secondary | ICD-10-CM | POA: Diagnosis not present

## 2014-09-14 DIAGNOSIS — I10 Essential (primary) hypertension: Secondary | ICD-10-CM | POA: Diagnosis not present

## 2014-09-14 DIAGNOSIS — M069 Rheumatoid arthritis, unspecified: Secondary | ICD-10-CM | POA: Diagnosis not present

## 2014-09-14 DIAGNOSIS — D869 Sarcoidosis, unspecified: Secondary | ICD-10-CM | POA: Diagnosis not present

## 2014-09-16 DIAGNOSIS — M1990 Unspecified osteoarthritis, unspecified site: Secondary | ICD-10-CM | POA: Diagnosis not present

## 2014-09-16 DIAGNOSIS — M069 Rheumatoid arthritis, unspecified: Secondary | ICD-10-CM | POA: Diagnosis not present

## 2014-09-16 DIAGNOSIS — D869 Sarcoidosis, unspecified: Secondary | ICD-10-CM | POA: Diagnosis not present

## 2014-09-16 DIAGNOSIS — E119 Type 2 diabetes mellitus without complications: Secondary | ICD-10-CM | POA: Diagnosis not present

## 2014-09-16 DIAGNOSIS — I10 Essential (primary) hypertension: Secondary | ICD-10-CM | POA: Diagnosis not present

## 2014-09-16 DIAGNOSIS — Z4789 Encounter for other orthopedic aftercare: Secondary | ICD-10-CM | POA: Diagnosis not present

## 2014-09-21 DIAGNOSIS — I10 Essential (primary) hypertension: Secondary | ICD-10-CM | POA: Diagnosis not present

## 2014-09-21 DIAGNOSIS — E119 Type 2 diabetes mellitus without complications: Secondary | ICD-10-CM | POA: Diagnosis not present

## 2014-09-21 DIAGNOSIS — D869 Sarcoidosis, unspecified: Secondary | ICD-10-CM | POA: Diagnosis not present

## 2014-09-21 DIAGNOSIS — M1990 Unspecified osteoarthritis, unspecified site: Secondary | ICD-10-CM | POA: Diagnosis not present

## 2014-09-21 DIAGNOSIS — M069 Rheumatoid arthritis, unspecified: Secondary | ICD-10-CM | POA: Diagnosis not present

## 2014-09-21 DIAGNOSIS — Z4789 Encounter for other orthopedic aftercare: Secondary | ICD-10-CM | POA: Diagnosis not present

## 2014-10-06 ENCOUNTER — Ambulatory Visit (INDEPENDENT_AMBULATORY_CARE_PROVIDER_SITE_OTHER): Payer: Medicare Other | Admitting: Internal Medicine

## 2014-10-06 ENCOUNTER — Encounter: Payer: Self-pay | Admitting: Internal Medicine

## 2014-10-06 VITALS — BP 110/68 | HR 92 | Wt 245.0 lb

## 2014-10-06 DIAGNOSIS — IMO0002 Reserved for concepts with insufficient information to code with codable children: Secondary | ICD-10-CM

## 2014-10-06 DIAGNOSIS — E538 Deficiency of other specified B group vitamins: Secondary | ICD-10-CM | POA: Diagnosis not present

## 2014-10-06 DIAGNOSIS — Z23 Encounter for immunization: Secondary | ICD-10-CM

## 2014-10-06 DIAGNOSIS — E1165 Type 2 diabetes mellitus with hyperglycemia: Secondary | ICD-10-CM

## 2014-10-06 DIAGNOSIS — M25561 Pain in right knee: Secondary | ICD-10-CM | POA: Diagnosis not present

## 2014-10-06 DIAGNOSIS — I10 Essential (primary) hypertension: Secondary | ICD-10-CM

## 2014-10-06 DIAGNOSIS — M150 Primary generalized (osteo)arthritis: Secondary | ICD-10-CM

## 2014-10-06 DIAGNOSIS — M159 Polyosteoarthritis, unspecified: Secondary | ICD-10-CM

## 2014-10-06 DIAGNOSIS — I2510 Atherosclerotic heart disease of native coronary artery without angina pectoris: Secondary | ICD-10-CM | POA: Diagnosis not present

## 2014-10-06 MED ORDER — MORPHINE SULFATE ER 15 MG PO TBCR: 15.0000 mg | EXTENDED_RELEASE_TABLET | Freq: Every day | ORAL | Status: DC

## 2014-10-06 MED ORDER — OXYCODONE HCL 30 MG PO TABS: 30.0000 mg | ORAL_TABLET | Freq: Four times a day (QID) | ORAL | Status: DC | PRN

## 2014-10-06 NOTE — Progress Notes (Signed)
Subjective:  Patient ID: [name], female    DOB: 1960-12-09  Age: 54 y.o. MRN: [id number]  CC: No chief complaint on file.   HPI [name] presents for DM, depression, chronic pain, GERD,OA f/u. In PT now. Her son is expecting a new grand baby due end of Feb 2017  Outpatient Prescriptions Prior to Visit  Medication Sig Dispense Refill  . Blood Glucose Monitoring Suppl (FREESTYLE LITE) DEVI Use to check blood sugar 2 times per day 1 each 0  . carvedilol (COREG) 12.5 MG tablet TAKE 1 TABLET BY MOUTH TWICE DAILY WITH MEALS 180 tablet 2  . cholecalciferol (VITAMIN D) 1000 UNITS tablet Take 1 tablet (1,000 Units total) by mouth daily. 100 tablet 3  . clobetasol (TEMOVATE) 0.05 % external solution Apply 1 application topically 2 (two) times daily.  3  . diazepam (VALIUM) 10 MG tablet Take 2.5 mg by mouth daily as needed (before procedures).   0  . escitalopram (LEXAPRO) 20 MG tablet TAKE 1 TABLET BY MOUTH DAILY 90 tablet 3  . glucose blood (FREESTYLE LITE) test strip TEST TWICE DAILY 100 each 3  . KLOR-CON 10 10 MEQ tablet TAKE 1 TABLET BY MOUTH DAILY 90 tablet 3  . Lancets (FREESTYLE) lancets 1 each by Other route 2 (two) times daily. Use as instructed 100 each 12  . metFORMIN (GLUCOPHAGE-XR) 500 MG 24 hr tablet Take 500 mg by mouth daily with breakfast.    . NEXIUM 40 MG capsule TAKE ONE CAPSULE BY MOUTH EVERY DAY BEFORE BREAKFAST 90 capsule 3  . nortriptyline (PAMELOR) 25 MG capsule TAKE 1 TO 2 CAPSULES BY MOUTH EVERY NIGHT AT BEDTIME AS NEEDED FOR INSOMNIA (Patient taking differently: TAKE 50 MG BY MOUTH EVERY NIGHT AT BEDTIME) 180 capsule 5  . topiramate (TOPAMAX) 50 MG tablet Take 1 tablet (50 mg total) by mouth 2 (two) times daily. 180 tablet 2  . VOLTAREN 1 % GEL Apply 2 g topically 3 (three) times daily as needed (knee pain).   5  . morphine (MS CONTIN) 15 MG 12 hr tablet Take 7.5-15 mg by mouth at bedtime.  0  . oxyCODONE (ROXICODONE) 15 MG immediate  release tablet Take 1 tablet (15 mg total) by mouth every 6 (six) hours as needed for pain. (Patient not taking: Reported on 10/06/2014) 120 tablet 0   No facility-administered medications prior to visit.    ROS Review of Systems  Constitutional: Positive for fatigue. Negative for chills, activity change, appetite change and unexpected weight change.  HENT: Negative for congestion, mouth sores and sinus pressure.   Eyes: Negative for visual disturbance.  Respiratory: Negative for cough and chest tightness.   Gastrointestinal: Negative for nausea and abdominal pain.  Genitourinary: Negative for frequency, difficulty urinating and vaginal pain.  Musculoskeletal: Positive for back pain, joint swelling, arthralgias and gait problem.  Skin: Negative for pallor and rash.  Neurological: Negative for dizziness, tremors, weakness, numbness and headaches.  Psychiatric/Behavioral: Positive for decreased concentration. Negative for suicidal ideas, confusion and sleep disturbance. The patient is nervous/anxious.     Objective:  BP 110/68 mmHg  Pulse 92  Wt 245 lb (111.131 kg)  SpO2 96%  BP Readings from Last 3 Encounters:  10/06/14 110/68  08/01/14 112/74  06/28/14 132/70    Wt Readings from Last 3 Encounters:  10/06/14 245 lb (111.131 kg)  08/01/14 247 lb (112.038 kg)  06/28/14 256 lb 4 oz (116.234 kg)    Physical Exam  Constitutional: She appears  well-developed. No distress.  HENT:  Head: Normocephalic.  Right Ear: External ear normal.  Left Ear: External ear normal.  Nose: Nose normal.  Mouth/Throat: Oropharynx is clear and moist.  Eyes: Conjunctivae are normal. Pupils are equal, round, and reactive to light. Right eye exhibits no discharge. Left eye exhibits no discharge.  Neck: Normal range of motion. Neck supple. No JVD present. No tracheal deviation present. No thyromegaly present.  Cardiovascular: Normal rate, regular rhythm and normal heart sounds.   Pulmonary/Chest: No  stridor. No respiratory distress. She has no wheezes.  Abdominal: Soft. Bowel sounds are normal. She exhibits no distension and no mass. There is no tenderness. There is no rebound and no guarding.  Musculoskeletal: She exhibits tenderness. She exhibits no edema.  Lymphadenopathy:    She has no cervical adenopathy.  Neurological: She displays normal reflexes. No cranial nerve deficit. She exhibits normal muscle tone. Coordination abnormal.  Skin: No rash noted. No erythema.  Psychiatric: Her behavior is normal. Judgment and thought content normal.  Sad Cane L wrist brace LS, knees tender w/ROM  Lab Results  Component Value Date   WBC 6.1 05/23/2014   HGB 14.0 05/23/2014   HCT 43.2 05/23/2014   PLT 222 05/23/2014   GLUCOSE 125* 06/28/2014   CHOL 193 06/29/2008   TRIG 145.0 06/29/2008   HDL 67.50 06/29/2008   LDLCALC 97 06/29/2008   ALT 23 03/25/2014   AST 28 03/25/2014   NA 138 06/28/2014   K 3.7 06/28/2014   CL 104 06/28/2014   CREATININE 1.02 06/28/2014   BUN 10 06/28/2014   CO2 25 06/28/2014   TSH 1.77 06/28/2014   INR 1.01 10/01/2010   HGBA1C 6.2 06/28/2014   MICROALBUR 0.5 03/03/2012    Dg Chest 2 View  05/23/2014   CLINICAL DATA:  Preoperative evaluation for hand surgery. History of sarcoidosis  EXAM: CHEST  2 VIEW  COMPARISON:  Chest radiograph January 01, 2012; chest CT February 02, 2013  FINDINGS: Ill-defined opacities in each upper lobe and in the right base regions are stable and likely related to the history of sarcoidosis. There is no edema or consolidation. Heart is upper normal in size with pulmonary vascularity within normal limits. Mild right hilar adenopathy remain stable. There is no new adenopathy. No bone lesions are appreciable.  IMPRESSION: Stable changes that are felt to be secondary to history of sarcoidosis. No frank edema or consolidation. No new adenopathy.   Electronically Signed   By: Lowella Grip III M.D.   On: 05/23/2014 09:44    Assessment  & Plan:   Diagnoses and all orders for this visit:  Essential hypertension  B12 deficiency  Diabetes mellitus type 2, uncontrolled  Right knee pain  Primary osteoarthritis involving multiple joints  Need for influenza vaccination -     Flu Vaccine QUAD 36+ mos IM  Other orders -     Discontinue: morphine (MS CONTIN) 15 MG 12 hr tablet; Take 1-2 tablets (15-30 mg total) by mouth at bedtime. Please fill on or after 12/09/14 -     Discontinue: oxycodone (ROXICODONE) 30 MG immediate release tablet; Take 1 tablet (30 mg total) by mouth every 6 (six) hours as needed for pain. Please fill on or after 12/09/14 -     morphine (MS CONTIN) 15 MG 12 hr tablet; Take 1-2 tablets (15-30 mg total) by mouth at bedtime. Please fill on or after 10/09/14 -     oxycodone (ROXICODONE) 30 MG immediate release tablet; Take 1 tablet (  30 mg total) by mouth every 6 (six) hours as needed for pain. Please fill on or after 11/08/14   I have discontinued Ms. Barbone's oxyCODONE, morphine, and oxycodone. I have also changed her morphine and oxycodone. Additionally, I am having her maintain her metFORMIN, VOLTAREN, cholecalciferol, nortriptyline, topiramate, carvedilol, NEXIUM, diazepam, clobetasol, escitalopram, glucose blood, freestyle, FREESTYLE LITE, and KLOR-CON 10.  Meds ordered this encounter  Medications  . DISCONTD: oxycodone (ROXICODONE) 30 MG immediate release tablet    Sig: Take 1 tablet by mouth 4 (four) times daily.    Refill:  0  . DISCONTD: morphine (MS CONTIN) 15 MG 12 hr tablet    Sig: Take 1-2 tablets (15-30 mg total) by mouth at bedtime. Please fill on or after 12/09/14    Dispense:  60 tablet    Refill:  0  . DISCONTD: oxycodone (ROXICODONE) 30 MG immediate release tablet    Sig: Take 1 tablet (30 mg total) by mouth every 6 (six) hours as needed for pain. Please fill on or after 12/09/14    Dispense:  120 tablet    Refill:  0  . morphine (MS CONTIN) 15 MG 12 hr tablet    Sig: Take 1-2  tablets (15-30 mg total) by mouth at bedtime. Please fill on or after 10/09/14    Dispense:  60 tablet    Refill:  0  . oxycodone (ROXICODONE) 30 MG immediate release tablet    Sig: Take 1 tablet (30 mg total) by mouth every 6 (six) hours as needed for pain. Please fill on or after 11/08/14    Dispense:  120 tablet    Refill:  0     Follow-up: Return in about 2 months (around 12/06/2014) for a follow-up visit.  Walker Kehr, MD

## 2014-10-06 NOTE — Assessment & Plan Note (Signed)
R TKR is planned for this year

## 2014-10-06 NOTE — Assessment & Plan Note (Signed)
On NAS diet  

## 2014-10-06 NOTE — Assessment & Plan Note (Signed)
On Metformin 

## 2014-10-06 NOTE — Progress Notes (Signed)
Pre visit review using our clinic review tool, if applicable. No additional management support is needed unless otherwise documented below in the visit note. 

## 2014-10-06 NOTE — Assessment & Plan Note (Signed)
On Oxycodone, MS contin  Potential benefits of a long term opioids use as well as potential risks (i.e. addiction risk, apnea etc) and complications (i.e. Somnolence, constipation and others) were explained to the patient and were aknowledged.  9/16 pt is asking me to take over her pain Rx (PA left and she is refusing to see Dr Andree Elk)

## 2014-10-06 NOTE — Assessment & Plan Note (Signed)
On B12 

## 2014-10-12 ENCOUNTER — Other Ambulatory Visit: Payer: Self-pay

## 2014-10-12 DIAGNOSIS — Z1231 Encounter for screening mammogram for malignant neoplasm of breast: Secondary | ICD-10-CM

## 2014-10-13 ENCOUNTER — Encounter: Payer: Self-pay | Admitting: Endocrinology

## 2014-10-13 DIAGNOSIS — E119 Type 2 diabetes mellitus without complications: Secondary | ICD-10-CM | POA: Diagnosis not present

## 2014-10-13 DIAGNOSIS — D869 Sarcoidosis, unspecified: Secondary | ICD-10-CM | POA: Diagnosis not present

## 2014-10-13 DIAGNOSIS — H2512 Age-related nuclear cataract, left eye: Secondary | ICD-10-CM | POA: Diagnosis not present

## 2014-10-13 DIAGNOSIS — H2511 Age-related nuclear cataract, right eye: Secondary | ICD-10-CM | POA: Diagnosis not present

## 2014-10-13 LAB — HM DIABETES EYE EXAM

## 2014-10-25 ENCOUNTER — Ambulatory Visit
Admission: RE | Admit: 2014-10-25 | Discharge: 2014-10-25 | Disposition: A | Discharge status: Home / Self Care | Payer: Medicare Other | Source: Ambulatory Visit

## 2014-10-25 DIAGNOSIS — Z1231 Encounter for screening mammogram for malignant neoplasm of breast: Secondary | ICD-10-CM

## 2014-10-26 ENCOUNTER — Other Ambulatory Visit (HOSPITAL_COMMUNITY): Payer: Self-pay | Admitting: Orthopedic Surgery

## 2014-10-26 DIAGNOSIS — G5602 Carpal tunnel syndrome, left upper limb: Secondary | ICD-10-CM | POA: Diagnosis not present

## 2014-10-27 ENCOUNTER — Other Ambulatory Visit: Payer: Self-pay | Admitting: Internal Medicine

## 2014-10-27 DIAGNOSIS — R928 Other abnormal and inconclusive findings on diagnostic imaging of breast: Secondary | ICD-10-CM

## 2014-11-02 ENCOUNTER — Telehealth: Payer: Self-pay | Admitting: Internal Medicine

## 2014-11-02 NOTE — Telephone Encounter (Signed)
Pt request to speak to the assistant concern about paper work or order request came from [facility]. Please pt once we found this paper. She is concern about this.

## 2014-11-04 NOTE — Telephone Encounter (Signed)
PCP signed orders in Epic. Pt informed

## 2014-11-07 NOTE — Pre-Procedure Instructions (Addendum)
[name]  11/07/2014      [id number] - [place], [address] AT [address] [place] [address] Phone: [phone] Fax: [phone]    Your procedure is scheduled on Tues, Nov 1 @ 11:00 AM  Report to [facility] Admitting at 9:00 AM  Call this number if you have problems the morning of surgery:  [phone]   Remember:  Do not eat food or drink liquids after midnight.  Take these medicines the morning of surgery with A SIP OF WATER Carvedilol(Coreg),Valium(Diazepam),Lexapro(Escitalopram),Pain Pill(if needed),and Nexium(Esomeprazole)             No Goody's,BC's,Aleve,Aspirin,Ibuprofen,Fish Oil,or any Herbal Medications.               How to Manage Your Diabetes Before Surgery   Why is it important to control my blood sugar before and after surgery?   Improving blood sugar levels before and after surgery helps healing and can limit problems.  A way of improving blood sugar control is eating a healthy diet by:  - Eating less sugar and carbohydrates  - Increasing activity/exercise  - Talk with your doctor about reaching your blood sugar goals  High blood sugars (greater than 180 mg/dL) can raise your risk of infections and slow down your recovery so you will need to focus on controlling your diabetes during the weeks before surgery.  Make sure that the doctor who takes care of your diabetes knows about your planned surgery including the date and location.  How do I manage my blood sugars before surgery?   Check your blood sugar at least 4 times a day, 2 days before surgery to make sure that they are not too high or low.   Check your blood sugar the morning of your surgery when you wake up and every 2 hours until you get to the Short-Stay unit.  If your blood sugar is less than 70 mg/dL, you will need to treat for low blood sugar by:  Treat a low blood sugar (less than 70  mg/dL) with 1/2 cup of clear juice (cranberry or apple), 4 glucose tablets, OR glucose gel.  Recheck blood sugar in 15 minutes after treatment (to make sure it is greater than 70 mg/dL).  If blood sugar is not greater than 70 mg/dL on re-check, call [phone] for further instructions.   Report your blood sugar to the Short-Stay nurse when you get to Short-Stay.  References:  [facility], 2007 "How to Manage your Diabetes Before and After Surgery".  What do I do about my diabetes medications?   Do not take oral diabetes medicines (pills) the morning of surgery.    Do not take other diabetes injectables the day of surgery including Byetta, Victoza, Bydureon, and Trulicity.   Do not wear jewelry, make-up or nail polish.  Do not wear lotions, powders, or perfumes.  You may wear deodorant.  Do not shave 48 hours prior to surgery.  Men may shave face and neck.  Do not bring valuables to the hospital.  [facility] is not responsible for any belongings or valuables.  Contacts, dentures or bridgework may not be worn into surgery.  Leave your suitcase in the car.  After surgery it may be brought to your room.  For patients admitted to the hospital, discharge time will be determined by your treatment team.  Patients discharged the day of  surgery will not be allowed to drive home.    Please read over the following fact sheets that you were given. Pain Booklet, Coughing and Deep Breathing, Blood Transfusion Information, MRSA Information and Surgical Site Infection Prevention

## 2014-11-08 ENCOUNTER — Encounter (HOSPITAL_COMMUNITY)
Admission: RE | Admit: 2014-11-08 | Discharge: 2014-11-08 | Disposition: A | Discharge status: Home / Self Care | Payer: Medicare Other | Source: Ambulatory Visit | Attending: Orthopedic Surgery | Admitting: Orthopedic Surgery

## 2014-11-08 DIAGNOSIS — M179 Osteoarthritis of knee, unspecified: Secondary | ICD-10-CM | POA: Diagnosis not present

## 2014-11-08 DIAGNOSIS — Z01812 Encounter for preprocedural laboratory examination: Secondary | ICD-10-CM | POA: Diagnosis not present

## 2014-11-08 DIAGNOSIS — Z0183 Encounter for blood typing: Secondary | ICD-10-CM | POA: Insufficient documentation

## 2014-11-08 LAB — SURGICAL PCR SCREEN
MRSA, PCR: NEGATIVE
Staphylococcus aureus: POSITIVE — AB

## 2014-11-08 LAB — CBC
HCT: 42.1 % (ref 36.0–46.0)
HEMOGLOBIN: 13.8 g/dL (ref 12.0–15.0)
MCH: 31.7 pg (ref 26.0–34.0)
MCHC: 32.8 g/dL (ref 30.0–36.0)
MCV: 96.6 fL (ref 78.0–100.0)
Platelets: 213 10*3/uL (ref 150–400)
RBC: 4.36 MIL/uL (ref 3.87–5.11)
RDW: 12.7 % (ref 11.5–15.5)
WBC: 7.5 10*3/uL (ref 4.0–10.5)

## 2014-11-08 LAB — BASIC METABOLIC PANEL
ANION GAP: 7 (ref 5–15)
BUN: 13 mg/dL (ref 6–20)
CALCIUM: 9.2 mg/dL (ref 8.9–10.3)
CO2: 24 mmol/L (ref 22–32)
Chloride: 109 mmol/L (ref 101–111)
Creatinine, Ser: 0.91 mg/dL (ref 0.44–1.00)
GFR calc Af Amer: >60 mL/min (ref >60)
GFR calc non Af Amer: >60 mL/min (ref >60)
GLUCOSE: 133 mg/dL — AB (ref 65–99)
Potassium: 4.3 mmol/L (ref 3.5–5.1)
Sodium: 140 mmol/L (ref 135–145)

## 2014-11-08 LAB — ABO/RH: ABO/RH(D): B POS

## 2014-11-08 LAB — URINALYSIS, ROUTINE W REFLEX MICROSCOPIC
Glucose, UA: NEGATIVE mg/dL
Hgb urine dipstick: NEGATIVE
Ketones, ur: NEGATIVE mg/dL
LEUKOCYTES UA: NEGATIVE
NITRITE: NEGATIVE
PROTEIN: NEGATIVE mg/dL
Specific Gravity, Urine: 1.025 (ref 1.005–1.030)
UROBILINOGEN UA: 1 mg/dL (ref 0.0–1.0)
pH: 5 (ref 5.0–8.0)

## 2014-11-08 LAB — GLUCOSE, CAPILLARY: GLUCOSE-CAPILLARY: 123 mg/dL — AB (ref 65–99)

## 2014-11-08 LAB — TYPE AND SCREEN
ABO/RH(D): B POS
Antibody Screen: NEGATIVE

## 2014-11-08 MED ORDER — CHLORHEXIDINE GLUCONATE 4 % EX LIQD: 60.0000 mL | Freq: Once | CUTANEOUS | Status: DC

## 2014-11-09 ENCOUNTER — Ambulatory Visit
Admission: RE | Admit: 2014-11-09 | Discharge: 2014-11-09 | Disposition: A | Discharge status: Home / Self Care | Payer: Medicare Other | Source: Ambulatory Visit | Attending: Internal Medicine | Admitting: Internal Medicine

## 2014-11-09 ENCOUNTER — Other Ambulatory Visit: Payer: Self-pay | Admitting: Internal Medicine

## 2014-11-09 DIAGNOSIS — R921 Mammographic calcification found on diagnostic imaging of breast: Secondary | ICD-10-CM | POA: Diagnosis not present

## 2014-11-09 DIAGNOSIS — R928 Other abnormal and inconclusive findings on diagnostic imaging of breast: Secondary | ICD-10-CM

## 2014-11-10 ENCOUNTER — Telehealth: Payer: Self-pay | Admitting: Internal Medicine

## 2014-11-10 LAB — URINE CULTURE

## 2014-11-10 NOTE — Telephone Encounter (Signed)
Noted. Thx.

## 2014-11-10 NOTE — Telephone Encounter (Signed)
Pt just wanted PCP aware that she is scheduled for her left breast bx tomorrow 11/11/14 and her knee surgery is scheduled for 11/15/14.       IMPRESSION: Suspicious developing calcifications in the upper-outer quadrant of the left breast.  RECOMMENDATION: Stereotactic biopsy of the left breast calcifications is recommended. This will be scheduled at the patient's convenience.

## 2014-11-10 NOTE — Telephone Encounter (Signed)
Patient states she has gotten her results of her mammogram.  She is requesting a call back as soon as possible.

## 2014-11-11 ENCOUNTER — Ambulatory Visit
Admission: RE | Admit: 2014-11-11 | Discharge: 2014-11-11 | Disposition: A | Discharge status: Home / Self Care | Payer: Medicare Other | Source: Ambulatory Visit | Attending: Internal Medicine | Admitting: Internal Medicine

## 2014-11-11 ENCOUNTER — Other Ambulatory Visit: Payer: Self-pay | Admitting: Internal Medicine

## 2014-11-11 DIAGNOSIS — R921 Mammographic calcification found on diagnostic imaging of breast: Secondary | ICD-10-CM

## 2014-11-11 DIAGNOSIS — D242 Benign neoplasm of left breast: Secondary | ICD-10-CM | POA: Diagnosis not present

## 2014-11-13 ENCOUNTER — Other Ambulatory Visit: Payer: Self-pay | Admitting: Internal Medicine

## 2014-11-14 MED ORDER — CEFAZOLIN SODIUM-DEXTROSE 2-3 GM-% IV SOLR
2.0000 g | INTRAVENOUS | Status: AC
  Administered 2014-11-15: 2 g via INTRAVENOUS
  Filled 2014-11-14: qty 50

## 2014-11-15 ENCOUNTER — Inpatient Hospital Stay (HOSPITAL_COMMUNITY)
Admission: RE | Admit: 2014-11-15 | Discharge: 2014-11-18 | DRG: 464 | Disposition: A | Discharge status: Skilled Nursing Facility | Payer: Medicare Other | Source: Ambulatory Visit | Attending: Orthopedic Surgery | Admitting: Orthopedic Surgery

## 2014-11-15 ENCOUNTER — Encounter (HOSPITAL_COMMUNITY)
Admission: RE | Disposition: A | Discharge status: Skilled Nursing Facility | Payer: Self-pay | Source: Ambulatory Visit | Attending: Orthopedic Surgery

## 2014-11-15 ENCOUNTER — Inpatient Hospital Stay (HOSPITAL_COMMUNITY): Payer: Medicare Other | Admitting: Anesthesiology

## 2014-11-15 ENCOUNTER — Encounter (HOSPITAL_COMMUNITY): Payer: Self-pay | Admitting: Anesthesiology

## 2014-11-15 DIAGNOSIS — G4733 Obstructive sleep apnea (adult) (pediatric): Secondary | ICD-10-CM | POA: Diagnosis present

## 2014-11-15 DIAGNOSIS — E119 Type 2 diabetes mellitus without complications: Secondary | ICD-10-CM | POA: Diagnosis present

## 2014-11-15 DIAGNOSIS — G4700 Insomnia, unspecified: Secondary | ICD-10-CM | POA: Diagnosis present

## 2014-11-15 DIAGNOSIS — M179 Osteoarthritis of knee, unspecified: Secondary | ICD-10-CM | POA: Diagnosis present

## 2014-11-15 DIAGNOSIS — Z79891 Long term (current) use of opiate analgesic: Secondary | ICD-10-CM

## 2014-11-15 DIAGNOSIS — Z96651 Presence of right artificial knee joint: Secondary | ICD-10-CM | POA: Diagnosis not present

## 2014-11-15 DIAGNOSIS — E559 Vitamin D deficiency, unspecified: Secondary | ICD-10-CM | POA: Diagnosis present

## 2014-11-15 DIAGNOSIS — M069 Rheumatoid arthritis, unspecified: Secondary | ICD-10-CM | POA: Diagnosis present

## 2014-11-15 DIAGNOSIS — F419 Anxiety disorder, unspecified: Secondary | ICD-10-CM | POA: Diagnosis present

## 2014-11-15 DIAGNOSIS — R2681 Unsteadiness on feet: Secondary | ICD-10-CM | POA: Diagnosis not present

## 2014-11-15 DIAGNOSIS — J309 Allergic rhinitis, unspecified: Secondary | ICD-10-CM | POA: Diagnosis present

## 2014-11-15 DIAGNOSIS — Z7984 Long term (current) use of oral hypoglycemic drugs: Secondary | ICD-10-CM | POA: Diagnosis not present

## 2014-11-15 DIAGNOSIS — I2510 Atherosclerotic heart disease of native coronary artery without angina pectoris: Secondary | ICD-10-CM | POA: Diagnosis present

## 2014-11-15 DIAGNOSIS — Z87891 Personal history of nicotine dependence: Secondary | ICD-10-CM | POA: Diagnosis not present

## 2014-11-15 DIAGNOSIS — Z8673 Personal history of transient ischemic attack (TIA), and cerebral infarction without residual deficits: Secondary | ICD-10-CM | POA: Diagnosis not present

## 2014-11-15 DIAGNOSIS — Z888 Allergy status to other drugs, medicaments and biological substances status: Secondary | ICD-10-CM

## 2014-11-15 DIAGNOSIS — M1710 Unilateral primary osteoarthritis, unspecified knee: Secondary | ICD-10-CM | POA: Diagnosis present

## 2014-11-15 DIAGNOSIS — Z91040 Latex allergy status: Secondary | ICD-10-CM | POA: Diagnosis not present

## 2014-11-15 DIAGNOSIS — D863 Sarcoidosis of skin: Secondary | ICD-10-CM | POA: Diagnosis present

## 2014-11-15 DIAGNOSIS — F329 Major depressive disorder, single episode, unspecified: Secondary | ICD-10-CM | POA: Diagnosis not present

## 2014-11-15 DIAGNOSIS — K219 Gastro-esophageal reflux disease without esophagitis: Secondary | ICD-10-CM | POA: Diagnosis present

## 2014-11-15 DIAGNOSIS — Z79899 Other long term (current) drug therapy: Secondary | ICD-10-CM

## 2014-11-15 DIAGNOSIS — F339 Major depressive disorder, recurrent, unspecified: Secondary | ICD-10-CM | POA: Diagnosis present

## 2014-11-15 DIAGNOSIS — R278 Other lack of coordination: Secondary | ICD-10-CM | POA: Diagnosis not present

## 2014-11-15 DIAGNOSIS — R0602 Shortness of breath: Secondary | ICD-10-CM | POA: Diagnosis not present

## 2014-11-15 DIAGNOSIS — M1711 Unilateral primary osteoarthritis, right knee: Principal | ICD-10-CM | POA: Diagnosis present

## 2014-11-15 DIAGNOSIS — Z8249 Family history of ischemic heart disease and other diseases of the circulatory system: Secondary | ICD-10-CM

## 2014-11-15 DIAGNOSIS — M25561 Pain in right knee: Secondary | ICD-10-CM | POA: Diagnosis present

## 2014-11-15 DIAGNOSIS — L929 Granulomatous disorder of the skin and subcutaneous tissue, unspecified: Secondary | ICD-10-CM | POA: Diagnosis not present

## 2014-11-15 DIAGNOSIS — M6281 Muscle weakness (generalized): Secondary | ICD-10-CM | POA: Diagnosis not present

## 2014-11-15 DIAGNOSIS — I10 Essential (primary) hypertension: Secondary | ICD-10-CM | POA: Diagnosis present

## 2014-11-15 DIAGNOSIS — Z471 Aftercare following joint replacement surgery: Secondary | ICD-10-CM | POA: Diagnosis not present

## 2014-11-15 HISTORY — PX: TOTAL KNEE ARTHROPLASTY: SHX125

## 2014-11-15 LAB — GLUCOSE, CAPILLARY
GLUCOSE-CAPILLARY: 75 mg/dL (ref 65–99)
GLUCOSE-CAPILLARY: 127 mg/dL — AB (ref 65–99)
Glucose-Capillary: 96 mg/dL (ref 65–99)
Glucose-Capillary: 79 mg/dL (ref 65–99)

## 2014-11-15 SURGERY — ARTHROPLASTY, KNEE, TOTAL
Anesthesia: Monitor Anesthesia Care | Site: Knee | Laterality: Right

## 2014-11-15 MED ORDER — BUPIVACAINE-EPINEPHRINE (PF) 0.25% -1:200000 IJ SOLN
INTRAMUSCULAR | Status: AC
  Filled 2014-11-15: qty 30

## 2014-11-15 MED ORDER — RIVAROXABAN 10 MG PO TABS
10.0000 mg | ORAL_TABLET | Freq: Every day | ORAL | Status: DC
  Administered 2014-11-16 – 2014-11-18 (×3): 10 mg via ORAL
  Filled 2014-11-15 (×3): qty 1

## 2014-11-15 MED ORDER — EPHEDRINE SULFATE 50 MG/ML IJ SOLN
INTRAMUSCULAR | Status: DC | PRN
  Administered 2014-11-15: 10 mg via INTRAVENOUS
  Administered 2014-11-15 (×2): 5 mg via INTRAVENOUS

## 2014-11-15 MED ORDER — PHENYLEPHRINE HCL 10 MG/ML IJ SOLN
INTRAMUSCULAR | Status: DC | PRN
  Administered 2014-11-15: 80 ug via INTRAVENOUS
  Administered 2014-11-15 (×3): 40 ug via INTRAVENOUS
  Administered 2014-11-15: 80 ug via INTRAVENOUS
  Administered 2014-11-15 (×3): 40 ug via INTRAVENOUS

## 2014-11-15 MED ORDER — TOPIRAMATE 25 MG PO TABS
50.0000 mg | ORAL_TABLET | Freq: Two times a day (BID) | ORAL | Status: DC
  Administered 2014-11-15 – 2014-11-18 (×6): 50 mg via ORAL
  Filled 2014-11-15 (×6): qty 2

## 2014-11-15 MED ORDER — FENTANYL CITRATE (PF) 100 MCG/2ML IJ SOLN
INTRAMUSCULAR | Status: AC
  Filled 2014-11-15: qty 2

## 2014-11-15 MED ORDER — PHENOL 1.4 % MT LIQD: 1.0000 | OROMUCOSAL | Status: DC | PRN

## 2014-11-15 MED ORDER — INSULIN ASPART 100 UNIT/ML ~~LOC~~ SOLN
0.0000 [IU] | Freq: Three times a day (TID) | SUBCUTANEOUS | Status: DC
  Administered 2014-11-17 – 2014-11-18 (×2): 2 [IU] via SUBCUTANEOUS

## 2014-11-15 MED ORDER — SODIUM CHLORIDE 0.9 % IJ SOLN
INTRAMUSCULAR | Status: AC
  Filled 2014-11-15: qty 10

## 2014-11-15 MED ORDER — POTASSIUM CHLORIDE IN NACL 20-0.9 MEQ/L-% IV SOLN
INTRAVENOUS | Status: AC
  Administered 2014-11-15: 21:00:00 via INTRAVENOUS
  Filled 2014-11-15 (×2): qty 1000

## 2014-11-15 MED ORDER — PROPOFOL 10 MG/ML IV BOLUS
INTRAVENOUS | Status: AC
  Filled 2014-11-15: qty 20

## 2014-11-15 MED ORDER — CLONIDINE HCL (ANALGESIA) 100 MCG/ML EP SOLN
150.0000 ug | Freq: Once | EPIDURAL | Status: AC
  Administered 2014-11-15: 1 mL via INTRA_ARTICULAR
  Filled 2014-11-15: qty 1.5

## 2014-11-15 MED ORDER — LACTATED RINGERS IV SOLN
INTRAVENOUS | Status: DC
  Administered 2014-11-15 (×3): via INTRAVENOUS

## 2014-11-15 MED ORDER — MIDAZOLAM HCL 2 MG/2ML IJ SOLN
INTRAMUSCULAR | Status: AC
  Filled 2014-11-15: qty 4

## 2014-11-15 MED ORDER — MORPHINE SULFATE (PF) 4 MG/ML IV SOLN
INTRAVENOUS | Status: AC
  Filled 2014-11-15: qty 2

## 2014-11-15 MED ORDER — VITAMIN D 1000 UNITS PO TABS
1000.0000 [IU] | ORAL_TABLET | Freq: Every day | ORAL | Status: DC
  Administered 2014-11-15 – 2014-11-18 (×4): 1000 [IU] via ORAL
  Filled 2014-11-15 (×4): qty 1

## 2014-11-15 MED ORDER — DOCUSATE SODIUM 100 MG PO CAPS
100.0000 mg | ORAL_CAPSULE | Freq: Two times a day (BID) | ORAL | Status: DC
  Administered 2014-11-15 – 2014-11-18 (×5): 100 mg via ORAL
  Filled 2014-11-15 (×5): qty 1

## 2014-11-15 MED ORDER — NORTRIPTYLINE HCL 25 MG PO CAPS
50.0000 mg | ORAL_CAPSULE | Freq: Every day | ORAL | Status: DC
  Administered 2014-11-15 – 2014-11-17 (×3): 50 mg via ORAL
  Filled 2014-11-15 (×4): qty 2

## 2014-11-15 MED ORDER — CLOBETASOL PROPIONATE 0.05 % EX SOLN: 1.0000 "application " | Freq: Two times a day (BID) | CUTANEOUS | Status: DC

## 2014-11-15 MED ORDER — BUPIVACAINE LIPOSOME 1.3 % IJ SUSP
20.0000 mL | INTRAMUSCULAR | Status: AC
  Administered 2014-11-15: 20 mL
  Filled 2014-11-15: qty 20

## 2014-11-15 MED ORDER — BUPIVACAINE IN DEXTROSE 0.75-8.25 % IT SOLN
INTRATHECAL | Status: DC | PRN
  Administered 2014-11-15: 2 mL via INTRATHECAL

## 2014-11-15 MED ORDER — HYDROMORPHONE HCL 1 MG/ML IJ SOLN
1.0000 mg | INTRAMUSCULAR | Status: DC | PRN
  Filled 2014-11-15 (×2): qty 1

## 2014-11-15 MED ORDER — HYDROMORPHONE HCL 1 MG/ML IJ SOLN
INTRAMUSCULAR | Status: AC
  Filled 2014-11-15: qty 2

## 2014-11-15 MED ORDER — BUPIVACAINE-EPINEPHRINE 0.25% -1:200000 IJ SOLN
INTRAMUSCULAR | Status: DC | PRN
  Administered 2014-11-15: 10 mL

## 2014-11-15 MED ORDER — MORPHINE SULFATE ER 15 MG PO TBCR
15.0000 mg | EXTENDED_RELEASE_TABLET | Freq: Every day | ORAL | Status: DC
  Administered 2014-11-15 – 2014-11-16 (×2): 15 mg via ORAL
  Filled 2014-11-15 (×2): qty 1

## 2014-11-15 MED ORDER — MIDAZOLAM HCL 5 MG/5ML IJ SOLN
INTRAMUSCULAR | Status: DC | PRN
  Administered 2014-11-15 (×2): .5 mg via INTRAVENOUS
  Administered 2014-11-15: 1 mg via INTRAVENOUS

## 2014-11-15 MED ORDER — OXYCODONE HCL 5 MG PO TABS
30.0000 mg | ORAL_TABLET | ORAL | Status: DC | PRN
  Administered 2014-11-15 – 2014-11-17 (×6): 30 mg via ORAL
  Filled 2014-11-15 (×6): qty 6

## 2014-11-15 MED ORDER — LIDOCAINE HCL (CARDIAC) 20 MG/ML IV SOLN
INTRAVENOUS | Status: DC | PRN
  Administered 2014-11-15: 50 mg via INTRAVENOUS

## 2014-11-15 MED ORDER — 0.9 % SODIUM CHLORIDE (POUR BTL) OPTIME
TOPICAL | Status: DC | PRN
  Administered 2014-11-15 (×2): 1000 mL

## 2014-11-15 MED ORDER — HYDROMORPHONE HCL 1 MG/ML IJ SOLN
0.5000 mg | INTRAMUSCULAR | Status: AC | PRN
  Administered 2014-11-15 (×4): 0.5 mg via INTRAVENOUS

## 2014-11-15 MED ORDER — FENTANYL CITRATE (PF) 250 MCG/5ML IJ SOLN
INTRAMUSCULAR | Status: AC
  Filled 2014-11-15: qty 5

## 2014-11-15 MED ORDER — ACETAMINOPHEN 650 MG RE SUPP: 650.0000 mg | Freq: Four times a day (QID) | RECTAL | Status: DC | PRN

## 2014-11-15 MED ORDER — FENTANYL CITRATE (PF) 100 MCG/2ML IJ SOLN
25.0000 ug | INTRAMUSCULAR | Status: DC | PRN
  Administered 2014-11-15 (×3): 50 ug via INTRAVENOUS

## 2014-11-15 MED ORDER — LIDOCAINE HCL (CARDIAC) 20 MG/ML IV SOLN
INTRAVENOUS | Status: AC
  Filled 2014-11-15: qty 5

## 2014-11-15 MED ORDER — ESCITALOPRAM OXALATE 10 MG PO TABS
20.0000 mg | ORAL_TABLET | Freq: Every day | ORAL | Status: DC
  Administered 2014-11-16 – 2014-11-18 (×3): 20 mg via ORAL
  Filled 2014-11-15 (×3): qty 2

## 2014-11-15 MED ORDER — ONDANSETRON HCL 4 MG/2ML IJ SOLN: 4.0000 mg | Freq: Four times a day (QID) | INTRAMUSCULAR | Status: DC | PRN

## 2014-11-15 MED ORDER — PANTOPRAZOLE SODIUM 40 MG PO TBEC
40.0000 mg | DELAYED_RELEASE_TABLET | Freq: Every day | ORAL | Status: DC
  Administered 2014-11-16 – 2014-11-18 (×3): 40 mg via ORAL
  Filled 2014-11-15 (×3): qty 1

## 2014-11-15 MED ORDER — METFORMIN HCL ER 500 MG PO TB24
500.0000 mg | ORAL_TABLET | Freq: Every day | ORAL | Status: DC
  Administered 2014-11-16 – 2014-11-18 (×3): 500 mg via ORAL
  Filled 2014-11-15 (×3): qty 1

## 2014-11-15 MED ORDER — ACETAMINOPHEN 325 MG PO TABS: 650.0000 mg | ORAL_TABLET | Freq: Four times a day (QID) | ORAL | Status: DC | PRN

## 2014-11-15 MED ORDER — PROPOFOL 500 MG/50ML IV EMUL
INTRAVENOUS | Status: DC | PRN
  Administered 2014-11-15: 75 ug/kg/min via INTRAVENOUS

## 2014-11-15 MED ORDER — PROMETHAZINE HCL 25 MG/ML IJ SOLN: 6.2500 mg | INTRAMUSCULAR | Status: DC | PRN

## 2014-11-15 MED ORDER — TRANEXAMIC ACID 1000 MG/10ML IV SOLN
2000.0000 mg | INTRAVENOUS | Status: AC
  Administered 2014-11-15: 2000 mg via TOPICAL
  Filled 2014-11-15: qty 20

## 2014-11-15 MED ORDER — CARVEDILOL 12.5 MG PO TABS
12.5000 mg | ORAL_TABLET | Freq: Two times a day (BID) | ORAL | Status: DC
  Administered 2014-11-15 – 2014-11-18 (×5): 12.5 mg via ORAL
  Filled 2014-11-15 (×6): qty 1

## 2014-11-15 MED ORDER — MORPHINE SULFATE (PF) 4 MG/ML IV SOLN
INTRAVENOUS | Status: DC | PRN
Start: 2014-11-15 — End: 2014-11-15
  Administered 2014-11-15: 8 mg

## 2014-11-15 MED ORDER — ONDANSETRON HCL 4 MG/2ML IJ SOLN
INTRAMUSCULAR | Status: AC
  Filled 2014-11-15: qty 2

## 2014-11-15 MED ORDER — MENTHOL 3 MG MT LOZG: 1.0000 | LOZENGE | OROMUCOSAL | Status: DC | PRN

## 2014-11-15 MED ORDER — METOCLOPRAMIDE HCL 5 MG/ML IJ SOLN: 5.0000 mg | Freq: Three times a day (TID) | INTRAMUSCULAR | Status: DC | PRN

## 2014-11-15 MED ORDER — OXYCODONE HCL 5 MG PO TABS
ORAL_TABLET | ORAL | Status: AC
  Filled 2014-11-15: qty 6

## 2014-11-15 MED ORDER — POTASSIUM CHLORIDE ER 10 MEQ PO TBCR
10.0000 meq | EXTENDED_RELEASE_TABLET | Freq: Every day | ORAL | Status: DC
  Administered 2014-11-15 – 2014-11-18 (×4): 10 meq via ORAL
  Filled 2014-11-15 (×4): qty 1

## 2014-11-15 MED ORDER — EPHEDRINE SULFATE 50 MG/ML IJ SOLN
INTRAMUSCULAR | Status: AC
  Filled 2014-11-15: qty 1

## 2014-11-15 MED ORDER — SODIUM CHLORIDE 0.9 % IR SOLN
Status: DC | PRN
  Administered 2014-11-15: 3000 mL

## 2014-11-15 MED ORDER — ONDANSETRON HCL 4 MG PO TABS
4.0000 mg | ORAL_TABLET | Freq: Four times a day (QID) | ORAL | Status: DC | PRN
  Administered 2014-11-16: 4 mg via ORAL
  Filled 2014-11-15: qty 1

## 2014-11-15 MED ORDER — CEFAZOLIN SODIUM-DEXTROSE 2-3 GM-% IV SOLR
2.0000 g | Freq: Four times a day (QID) | INTRAVENOUS | Status: AC
  Administered 2014-11-15 – 2014-11-16 (×2): 2 g via INTRAVENOUS
  Filled 2014-11-15 (×2): qty 50

## 2014-11-15 MED ORDER — FENTANYL CITRATE (PF) 100 MCG/2ML IJ SOLN
INTRAMUSCULAR | Status: DC | PRN
  Administered 2014-11-15: 50 ug via INTRAVENOUS
  Administered 2014-11-15: 25 ug via INTRAVENOUS
  Administered 2014-11-15: 50 ug via INTRAVENOUS
  Administered 2014-11-15: 25 ug via INTRAVENOUS
  Administered 2014-11-15 (×2): 50 ug via INTRAVENOUS

## 2014-11-15 MED ORDER — SODIUM CHLORIDE 0.9 % IV SOLN
10.0000 mg | INTRAVENOUS | Status: DC | PRN
  Administered 2014-11-15: 20 ug/min via INTRAVENOUS

## 2014-11-15 MED ORDER — SODIUM CHLORIDE 0.9 % IJ SOLN: INTRAMUSCULAR | Status: DC | PRN

## 2014-11-15 MED ORDER — PHENYLEPHRINE HCL 10 MG/ML IJ SOLN
INTRAMUSCULAR | Status: AC
  Filled 2014-11-15: qty 1

## 2014-11-15 MED ORDER — ONDANSETRON HCL 4 MG/2ML IJ SOLN
INTRAMUSCULAR | Status: DC | PRN
Start: 2014-11-15 — End: 2014-11-15
  Administered 2014-11-15: 4 mg via INTRAVENOUS

## 2014-11-15 MED ORDER — METOCLOPRAMIDE HCL 5 MG PO TABS: 5.0000 mg | ORAL_TABLET | Freq: Three times a day (TID) | ORAL | Status: DC | PRN

## 2014-11-15 SURGICAL SUPPLY — 82 items
BAG DECANTER FOR FLEXI CONT (MISCELLANEOUS) ×3 IMPLANT
BANDAGE ELASTIC 4 VELCRO ST LF (GAUZE/BANDAGES/DRESSINGS) ×3 IMPLANT
BANDAGE ELASTIC 6 VELCRO ST LF (GAUZE/BANDAGES/DRESSINGS) ×3 IMPLANT
BANDAGE ESMARK 6X9 LF (GAUZE/BANDAGES/DRESSINGS) ×1 IMPLANT
BLADE SAG 18X100X1.27 (BLADE) ×3 IMPLANT
BLADE SAW SGTL 13.0X1.19X90.0M (BLADE) IMPLANT
BNDG COHESIVE 6X5 TAN STRL LF (GAUZE/BANDAGES/DRESSINGS) ×3 IMPLANT
BNDG ELASTIC 6X10 VLCR STRL LF (GAUZE/BANDAGES/DRESSINGS) ×9 IMPLANT
BNDG ESMARK 6X9 LF (GAUZE/BANDAGES/DRESSINGS) ×3
BOWL SMART MIX CTS (DISPOSABLE) IMPLANT
CAPT KNEE TOTAL 3 ×3 IMPLANT
CEMENT BONE SIMPLEX SPEEDSET (Cement) ×6 IMPLANT
CLOSURE WOUND 1/2 X4 (GAUZE/BANDAGES/DRESSINGS) ×1
CONT SPEC 4OZ CLIKSEAL STRL BL (MISCELLANEOUS) ×3 IMPLANT
COVER SURGICAL LIGHT HANDLE (MISCELLANEOUS) ×3 IMPLANT
CUFF TOURNIQUET SINGLE 34IN LL (TOURNIQUET CUFF) ×3 IMPLANT
CUFF TOURNIQUET SINGLE 44IN (TOURNIQUET CUFF) IMPLANT
DECANTER SPIKE VIAL GLASS SM (MISCELLANEOUS) ×3 IMPLANT
DRAPE INCISE IOBAN 66X45 STRL (DRAPES) IMPLANT
DRAPE ORTHO SPLIT 77X108 STRL (DRAPES) ×6
DRAPE SURG ORHT 6 SPLT 77X108 (DRAPES) ×3 IMPLANT
DRAPE U-SHAPE 47X51 STRL (DRAPES) ×3 IMPLANT
DRSG MEPILEX BORDER 4X12 (GAUZE/BANDAGES/DRESSINGS) ×3 IMPLANT
DRSG MEPILEX BORDER 4X8 (GAUZE/BANDAGES/DRESSINGS) ×3 IMPLANT
DRSG PAD ABDOMINAL 8X10 ST (GAUZE/BANDAGES/DRESSINGS) ×6 IMPLANT
DURAPREP 26ML APPLICATOR (WOUND CARE) ×3 IMPLANT
ELECT REM PT RETURN 9FT ADLT (ELECTROSURGICAL) ×3
ELECTRODE REM PT RTRN 9FT ADLT (ELECTROSURGICAL) ×1 IMPLANT
FACESHIELD WRAPAROUND (MASK) ×3 IMPLANT
GAUZE SPONGE 4X4 12PLY STRL (GAUZE/BANDAGES/DRESSINGS) ×3 IMPLANT
GAUZE XEROFORM 1X8 LF (GAUZE/BANDAGES/DRESSINGS) ×3 IMPLANT
GAUZE XEROFORM 5X9 LF (GAUZE/BANDAGES/DRESSINGS) ×3 IMPLANT
GLOVE BIOGEL PI IND STRL 6.5 (GLOVE) ×2 IMPLANT
GLOVE BIOGEL PI IND STRL 7.5 (GLOVE) ×1 IMPLANT
GLOVE BIOGEL PI IND STRL 8 (GLOVE) ×3 IMPLANT
GLOVE BIOGEL PI INDICATOR 6.5 (GLOVE) ×4
GLOVE BIOGEL PI INDICATOR 7.5 (GLOVE) ×2
GLOVE BIOGEL PI INDICATOR 8 (GLOVE) ×6
GLOVE ECLIPSE 7.0 STRL STRAW (GLOVE) ×6 IMPLANT
GLOVE SURG ORTHO 8.0 STRL STRW (GLOVE) ×6 IMPLANT
GLOVE SURG SS PI 6.5 STRL IVOR (GLOVE) ×9 IMPLANT
GOWN STRL REUS W/ TWL LRG LVL3 (GOWN DISPOSABLE) ×3 IMPLANT
GOWN STRL REUS W/TWL 2XL LVL3 (GOWN DISPOSABLE) ×3 IMPLANT
GOWN STRL REUS W/TWL LRG LVL3 (GOWN DISPOSABLE) ×6
HANDPIECE INTERPULSE COAX TIP (DISPOSABLE)
HOOD PEEL AWAY FACE SHEILD DIS (HOOD) ×9 IMPLANT
IMMOBILIZER KNEE 20 (SOFTGOODS) IMPLANT
IMMOBILIZER KNEE 22 UNIV (SOFTGOODS) ×3 IMPLANT
IMMOBILIZER KNEE 24 THIGH 36 (MISCELLANEOUS) IMPLANT
IMMOBILIZER KNEE 24 UNIV (MISCELLANEOUS)
KIT BASIN OR (CUSTOM PROCEDURE TRAY) ×3 IMPLANT
KIT ROOM TURNOVER OR (KITS) ×3 IMPLANT
MANIFOLD NEPTUNE II (INSTRUMENTS) ×3 IMPLANT
NDL SAFETY ECLIPSE 18X1.5 (NEEDLE) ×1 IMPLANT
NEEDLE 18GX1X1/2 (RX/OR ONLY) (NEEDLE) ×3 IMPLANT
NEEDLE HYPO 18GX1.5 SHARP (NEEDLE) ×2
NEEDLE HYPO 22GX1.5 SAFETY (NEEDLE) ×6 IMPLANT
NEEDLE SPNL 18GX3.5 QUINCKE PK (NEEDLE) ×3 IMPLANT
NS IRRIG 1000ML POUR BTL (IV SOLUTION) ×6 IMPLANT
PACK TOTAL JOINT (CUSTOM PROCEDURE TRAY) ×3 IMPLANT
PACK UNIVERSAL I (CUSTOM PROCEDURE TRAY) ×3 IMPLANT
PAD ARMBOARD 7.5X6 YLW CONV (MISCELLANEOUS) ×6 IMPLANT
PAD CAST 4YDX4 CTTN HI CHSV (CAST SUPPLIES) ×1 IMPLANT
PADDING CAST COTTON 4X4 STRL (CAST SUPPLIES) ×2
PADDING CAST COTTON 6X4 STRL (CAST SUPPLIES) ×3 IMPLANT
SET HNDPC FAN SPRY TIP SCT (DISPOSABLE) IMPLANT
SPONGE LAP 18X18 X RAY DECT (DISPOSABLE) IMPLANT
STEM CEMENTED TRIATHLON (Stem) ×3 IMPLANT
STRIP CLOSURE SKIN 1/2X4 (GAUZE/BANDAGES/DRESSINGS) ×2 IMPLANT
SUCTION FRAZIER TIP 10 FR DISP (SUCTIONS) ×3 IMPLANT
SUT MNCRL AB 3-0 PS2 18 (SUTURE) ×3 IMPLANT
SUT VIC AB 0 CT1 27 (SUTURE) ×8
SUT VIC AB 0 CT1 27XBRD ANBCTR (SUTURE) ×4 IMPLANT
SUT VIC AB 1 CT1 27 (SUTURE) ×10
SUT VIC AB 1 CT1 27XBRD ANBCTR (SUTURE) ×5 IMPLANT
SUT VIC AB 2-0 CT1 27 (SUTURE) ×4
SUT VIC AB 2-0 CT1 TAPERPNT 27 (SUTURE) ×2 IMPLANT
SYR 30ML LL (SYRINGE) ×12 IMPLANT
SYR TB 1ML LUER SLIP (SYRINGE) ×3 IMPLANT
TOWEL OR 17X24 6PK STRL BLUE (TOWEL DISPOSABLE) ×6 IMPLANT
TOWEL OR 17X26 10 PK STRL BLUE (TOWEL DISPOSABLE) ×6 IMPLANT
WATER STERILE IRR 1000ML POUR (IV SOLUTION) ×6 IMPLANT

## 2014-11-15 NOTE — Anesthesia Postprocedure Evaluation (Signed)
Anesthesia Post Note  Patient: [name]  Procedure(s) Performed: Procedure(s) (LRB): TOTAL RIGHT KNEE ARTHROPLASTY (Right)  Anesthesia type: MAC/SAB  Patient location: PACU  Post pain: Pain level controlled  Post assessment: Patient's Cardiovascular Status Stable, SAB receding  Last Vitals:  Filed Vitals:   11/15/14 1521  BP: 110/62  Pulse: 76  Temp:   Resp: 11    Post vital signs: Reviewed and stable  Level of consciousness: sedated  Complications: No apparent anesthesia complications

## 2014-11-15 NOTE — Brief Op Note (Signed)
11/15/2014  2:06 PM  PATIENT:  [name]  54 y.o. female  PRE-OPERATIVE DIAGNOSIS:  RIGHT KNEE OSTEOARHTRITIS  POST-OPERATIVE DIAGNOSIS:  RIGHT KNEE OSTEOARTHRITIS  PROCEDURE:  Procedure(s): TOTAL RIGHT KNEE ARTHROPLASTY  SURGEON:  Surgeon(s): Meredith Pel, MD  ASSISTANT: Laure Kidney rnfa  ANESTHESIA:   spinal  EBL: 150 ml    Total I/O In: 1300 [I.V.:1300] Out: -   BLOOD ADMINISTERED: none  DRAINS: none   LOCAL MEDICATIONS USED:  Marcaine mso4 clonidine exparel  SPECIMEN:  No Specimen  COUNTS:  YES  TOURNIQUET:   Total Tourniquet Time Documented: Thigh (Right) - 93 minutes Total: Thigh (Right) - 93 minutes   DICTATION: .Other Dictation: Dictation Number [id number]  PLAN OF CARE: Admit to inpatient   PATIENT DISPOSITION:  PACU - hemodynamically stable

## 2014-11-15 NOTE — Progress Notes (Signed)
Orthopedic Tech Progress Note Patient Details:  [name] [facility] Sep 26, 1960 [id number]  CPM Right Knee Right Knee Flexion (Degrees): 40 Right Knee Extension (Degrees): 0 Additional Comments: footsie roll on bed  Ortho Devices Ortho Device/Splint Location: footsie roll Ortho Device/Splint Interventions: Criss Alvine 11/15/2014, 4:10 PM

## 2014-11-15 NOTE — Progress Notes (Signed)
Utilization review completed.  

## 2014-11-15 NOTE — Progress Notes (Signed)
Orthopedic Tech Progress Note Patient Details:  [name] September 19, 1960 [id number] Off cpm at 7:00 pm Patient ID: [name], female   DOB: 1960-11-22, 54 y.o.   MRN: [id number]   [name] 11/15/2014, 6:55 PM

## 2014-11-15 NOTE — Anesthesia Preprocedure Evaluation (Addendum)
Anesthesia Evaluation  Patient identified by MRN, date of birth, ID band Patient awake    Reviewed: Allergy & Precautions, NPO status , Patient's Chart, lab work & pertinent test results, reviewed documented beta blocker date and time   Airway Mallampati: II  TM Distance: >3 FB Neck ROM: Full    Dental  (+) Dental Advisory Given, Partial Upper, Missing   Pulmonary shortness of breath, sleep apnea , former smoker,    Pulmonary exam normal breath sounds clear to auscultation       Cardiovascular hypertension, Pt. on medications and Pt. on home beta blockers (-) angina+ CAD  (-) Past MI Normal cardiovascular exam Rhythm:Regular Rate:Normal     Neuro/Psych  Headaches, PSYCHIATRIC DISORDERS Anxiety Depression TIA   GI/Hepatic Neg liver ROS, GERD  Medicated,  Endo/Other  diabetes, Type 2, Oral Hypoglycemic AgentsObesity   Renal/GU negative Renal ROS     Musculoskeletal  (+) Arthritis , Osteoarthritis,    Abdominal   Peds  Hematology negative hematology ROS (+)   Anesthesia Other Findings Day of surgery medications reviewed with the patient.  Sarcoidosis. Took self off prednisone 2 yrs ago.  Reproductive/Obstetrics                          Anesthesia Physical Anesthesia Plan  ASA: III  Anesthesia Plan: Spinal and MAC   Post-op Pain Management:    Induction: Intravenous  Airway Management Planned: Simple Face Mask  Additional Equipment:   Intra-op Plan:   Post-operative Plan:   Informed Consent: I have reviewed the patients History and Physical, chart, labs and discussed the procedure including the risks, benefits and alternatives for the proposed anesthesia with the patient or authorized representative who has indicated his/her understanding and acceptance.   Dental advisory given  Plan Discussed with: CRNA, Anesthesiologist and Surgeon  Anesthesia Plan Comments: (Discussed risks  and benefits of and differences between spinal and general. Discussed risks of spinal including headache, backache, failure, bleeding, infection, and nerve damage. Patient consents to spinal. Questions answered. Coagulation studies and platelet count acceptable.)       Anesthesia Quick Evaluation                                  Anesthesia Evaluation  Patient identified by MRN, date of birth, ID band Patient awake    Reviewed: Allergy & Precautions, NPO status , Patient's Chart, lab work & pertinent test results, reviewed documented beta blocker date and time   Airway Mallampati: II  TM Distance: >3 FB Neck ROM: Full    Dental  (+) Partial Upper, Dental Advisory Given   Pulmonary sleep apnea , former smoker,  Denies home O2 or CPAP   Pulmonary exam normal       Cardiovascular hypertension, Pt. on home beta blockers and Pt. on medications + CAD Normal cardiovascular exam    Neuro/Psych Anxiety Depression TIA   GI/Hepatic GERD-  ,  Endo/Other  diabetes, Well Controlled, Type 2, Oral Hypoglycemic AgentsMorbid obesity  Renal/GU      Musculoskeletal  (+) Arthritis -, Fibromyalgia -  Abdominal   Peds  Hematology   Anesthesia Other Findings Sarcoidosis.  Took self off prednisone 2 yrs ago.  Reproductive/Obstetrics                           Anesthesia Physical Anesthesia Plan  ASA: III  Anesthesia Plan: General   Post-op Pain Management:    Induction: Intravenous  Airway Management Planned: LMA and Oral ETT  Additional Equipment:   Intra-op Plan:   Post-operative Plan: Extubation in OR  Informed Consent: I have reviewed the patients History and Physical, chart, labs and discussed the procedure including the risks, benefits and alternatives for the proposed anesthesia with the patient or authorized representative who has indicated his/her understanding and acceptance.     Plan Discussed with: CRNA, Anesthesiologist  and Surgeon  Anesthesia Plan Comments:         Anesthesia Quick Evaluation

## 2014-11-15 NOTE — H&P (Signed)
TOTAL KNEE ADMISSION H&P  Patient is being admitted for right total knee arthroplasty.  Subjective:  Chief Complaint:right knee pain.  HPI: [name], 54 y.o. female, has a history of pain and functional disability in the right knee due to arthritis and has failed non-surgical conservative treatments for greater than 12 weeks to includeNSAID's and/or analgesics, corticosteriod injections, viscosupplementation injections, use of assistive devices and activity modification.  Onset of symptoms was gradual, starting 8 years ago with gradually worsening course since that time. The patient noted no past surgery on the right knee(s).  Patient currently rates pain in the right knee(s) at 10 out of 10 with activity. Patient has night pain, worsening of pain with activity and weight bearing, pain that interferes with activities of daily living, pain with passive range of motion, crepitus and joint swelling.  Patient has evidence of subchondral sclerosis, periarticular osteophytes, joint subluxation and joint space narrowing by imaging studies. This patient has had Significant pain and desires correction of her knee arthritis prior to the arrival of her only grandchild. There is no active infection.  Patient Active Problem List   Diagnosis Date Noted  . Calf pain 01/21/2014  . Insomnia 12/23/2013  . Leg ulcer, left ([place]) 09/13/2013  . Abnormal SPEP 06/04/2013  . Memory loss 04/27/2013  . Panic attacks 03/17/2013  . Right knee pain 12/29/2012  . Rash and nonspecific skin eruption 10/27/2012  . Dizziness and giddiness 09/07/2012  . Pulmonary nodules 07/20/2012  . CAD in native artery 05/28/2012  . Seborrheic dermatitis 01/20/2012  . Recurrent knee pain 09/03/2011  . Knee pain, left 08/03/2011  . Leg pain, left 07/17/2011  . Polydipsia 05/09/2011  . Diabetes mellitus type 2, uncontrolled ([place]) 05/09/2011  . Abscess of axilla, left 08/10/2010  . Obesity 03/07/2010  . INSOMNIA, CHRONIC  03/07/2010  . NAUSEA 03/07/2010  . BROKEN TOOTH 03/07/2010  . ALLERGIC RHINITIS 10/26/2009  . GRIEF REACTION 08/01/2009  . DYSPNEA 04/28/2009  . MAXILLARY SINUSITIS 10/05/2008  . Essential hypertension 08/27/2007  . B12 deficiency 08/25/2007  . Vitamin D deficiency 08/25/2007  . ELEVATED BP 07/10/2007  . VISUAL CHANGES 07/02/2007  . UTI 02/07/2007  . ABDOMINAL PAIN RIGHT LOWER QUADRANT 02/07/2007  . SINUSITIS, ACUTE 02/02/2007  . Osteoarthritis 02/02/2007  . Sarcoidosis ([place]) 08/14/2006  . Anxiety state 08/14/2006  . Major depressive disorder, recurrent episode ([place]) 08/14/2006  . GERD 08/14/2006   Past Medical History  Diagnosis Date  . Osteoarthritis   . Sarcoidosis ([place])     Dr. Lolita Patella  . Anxiety   . Vitamin D deficiency     is supposed to take Vit D but can't afford it  . Allergic rhinitis   . ALLERGIC RHINITIS 10/26/2009  . ANXIETY 08/14/2006  . B12 DEFICIENCY 08/25/2007  . VITAMIN D DEFICIENCY 08/25/2007  . Hypertension     takes Coreg daily  . HYPERTENSION 08/27/2007  . DYSPNEA 04/28/2009    with exertion  . Shortness of breath   . Pneumonia     over 30 yrs ago  . Headache     last migraine 2-10yrs ago;takes Topamax daily  . Confusion   . Short-term memory loss   . Long-term memory loss   . TIA (transient ischemic attack)   . Joint pain   . Joint swelling   . Rheumatoid arthritis ([place])   . Osteoarthritis   . Lichen planus   . Insomnia     takes Nortriptyline nightly   . Esophageal reflux  takes Nexium daily  . Depression     takes Lexapro daily  . DEPRESSION 08/14/2006  . Urinary urgency   . Nocturia   . Diabetes mellitus without complication ([place])     was on insulin but has been off since Nov 2015 and now only takes Metformin daily  . OSA (obstructive sleep apnea)     doesn't use CPAP;sleep study in epic from 2006  . History of shingles     Past Surgical History  Procedure Laterality Date  . Arthroscopic knee surgery Right 11-12-04  .  Appendectomy    . Endometrial ablation    . Axillary abcess irrigation and debridement  Jul & Aug2012  . Cyst removed from top of buttocks  at age 40  . Carpal tunnel release Left 05/23/2014    Procedure: CARPAL TUNNEL RELEASE;  Surgeon: Meredith Pel, MD;  Location: [facility];  Service: Orthopedics;  Laterality: Left;    Prescriptions prior to admission  Medication Sig Dispense Refill Last Dose  . carvedilol (COREG) 12.5 MG tablet TAKE 1 TABLET BY MOUTH TWICE DAILY WITH MEALS 180 tablet 2 11/15/2014 at 0730  . cholecalciferol (VITAMIN D) 1000 UNITS tablet Take 1 tablet (1,000 Units total) by mouth daily. 100 tablet 3 more than a month  . diazepam (VALIUM) 10 MG tablet Take 5 mg by mouth daily as needed for anxiety (before procedures).   0 11/15/2014 at Unknown time  . escitalopram (LEXAPRO) 20 MG tablet TAKE 1 TABLET BY MOUTH DAILY 90 tablet 3 11/15/2014 at Unknown time  . KLOR-CON 10 10 MEQ tablet TAKE 1 TABLET BY MOUTH DAILY 90 tablet 3 11/14/2014 at Unknown time  . metFORMIN (GLUCOPHAGE-XR) 500 MG 24 hr tablet Take 500 mg by mouth daily with breakfast.   11/14/2014 at Unknown time  . morphine (MS CONTIN) 15 MG 12 hr tablet Take 1-2 tablets (15-30 mg total) by mouth at bedtime. Please fill on or after 10/09/14 (Patient taking differently: Take 15 mg by mouth at bedtime. ) 60 tablet 0 11/13/2014  . NEXIUM 40 MG capsule TAKE ONE CAPSULE BY MOUTH EVERY DAY BEFORE BREAKFAST 90 capsule 3 11/15/2014 at Unknown time  . nortriptyline (PAMELOR) 25 MG capsule TAKE 1 TO 2 CAPSULES BY MOUTH EVERY NIGHT AT BEDTIME AS NEEDED FOR INSOMNIA (Patient taking differently: TAKE 50 MG BY MOUTH EVERY NIGHT AT BEDTIME) 180 capsule 5 11/14/2014 at Unknown time  . oxycodone (ROXICODONE) 30 MG immediate release tablet Take 1 tablet (30 mg total) by mouth every 6 (six) hours as needed for pain. Please fill on or after 11/08/14 120 tablet 0 11/15/2014 at Unknown time  . topiramate (TOPAMAX) 50 MG tablet TAKE 1 TABLET BY MOUTH  TWICE DAILY 180 tablet 2 11/14/2014 at Unknown time  . Blood Glucose Monitoring Suppl (FREESTYLE LITE) DEVI Use to check blood sugar 2 times per day 1 each 0 Taking  . clobetasol (TEMOVATE) 0.05 % external solution Apply 1 application topically 2 (two) times daily.  3 More than a month at Unknown time  . glucose blood (FREESTYLE LITE) test strip TEST TWICE DAILY 100 each 3 Taking  . Lancets (FREESTYLE) lancets 1 each by Other route 2 (two) times daily. Use as instructed 100 each 12 Taking  . VOLTAREN 1 % GEL Apply 2 g topically 3 (three) times daily as needed (knee pain).   5 More than a month at Unknown time   Allergies  Allergen Reactions  . Enalapril Maleate     REACTION: cough  .  Hydrochlorothiazide     REACTION: Low potassium  . Hydroxychloroquine Sulfate     REACTION: vision changes  . Iodine     Mother was intolerant--CODED on CT table---pt never tired.  . Latex     Hives    Social History  Substance Use Topics  . Smoking status: Former Smoker -- 0.00 packs/day    Types: Cigarettes  . Smokeless tobacco: Not on file     Comment: quit smoking in 2004  . Alcohol Use: No    Family History  Problem Relation Age of Onset  . Hypertension    . Coronary artery disease      female 1st degree relative <60  . Heart failure      congestive  . Coronary artery disease      Female 1st degree relative <50  . Breast cancer      1st degree relative <50 S  . Heart disease Mother   . Kidney disease Mother   . Cancer Father     leukemia     Review of Systems  Constitutional: Negative.   HENT: Negative.   Eyes: Negative.   Respiratory: Negative.   Cardiovascular: Negative.   Gastrointestinal: Negative.   Genitourinary: Negative.   Musculoskeletal: Positive for joint pain.  Skin: Negative.   Neurological: Negative.   Endo/Heme/Allergies: Negative.   Psychiatric/Behavioral: Negative.     Objective:  Physical Exam  Constitutional: She appears well-developed.  HENT:  Head:  Normocephalic.  Eyes: Pupils are equal, round, and reactive to light.  Neck: Normal range of motion.  Cardiovascular: Normal rate.   Respiratory: Effort normal.  Neurological: She is alert.  Skin: Skin is warm.  Psychiatric: She has a normal mood and affect.   examination of the right knee demonstrates slight valgus alignment palpable pedal pulses intact ankle dorsi flexion plantar flexion. Patellar tenderness there is a sarcoid skin lesion measuring about 0.5 x 0.5 cm on the anterior aspect of the knee which is been there for about 20 years. By history it will occasionally bleed when mechanical trauma occurs - range of motion is 5-100 there is no groin pain with internal extra rotation of the leg  Vital signs in last 24 hours: Temp:  [97.5 F (36.4 C)] 97.5 F (36.4 C) (11/01 0911) Pulse Rate:  [85] 85 (11/01 0911) Resp:  [16] 16 (11/01 0911) BP: (129)/(56) 129/56 mmHg (11/01 0911) SpO2:  [98 %] 98 % (11/01 0911)  Labs:   Estimated body mass index is 38.36 kg/(m^2) as calculated from the following:   Height as of 11/08/14: 5\' 7"  (1.702 m).   Weight as of 10/06/14: 111.131 kg (245 lb).   Imaging Review Plain radiographs demonstrate severe degenerative joint disease of the right knee(s). The overall alignment ismild valgus. The bone quality appears to be fair for age and reported activity level.  Assessment/Plan:  End stage arthritis, right knee   The patient history, physical examination, clinical judgment of the provider and imaging studies are consistent with end stage degenerative joint disease of the right knee(s) and total knee arthroplasty is deemed medically necessary. The treatment options including medical management, injection therapy arthroscopy and arthroplasty were discussed at length. The risks and benefits of total knee arthroplasty were presented and reviewed. The risks due to aseptic loosening, infection, stiffness, patella tracking problems, thromboembolic  complications and other imponderables were discussed. The patient acknowledged the explanation, agreed to proceed with the plan and consent was signed. Patient is being admitted for inpatient treatment for  surgery, pain control, PT, OT, prophylactic antibiotics, VTE prophylaxis, progressive ambulation and ADL's and discharge planning. The patient is planning to be discharged to skilled nursing facility plan also is to excise this sarcoid skin lesion in the anterior aspect of the knee.

## 2014-11-15 NOTE — Transfer of Care (Signed)
Immediate Anesthesia Transfer of Care Note  Patient: [name]  Procedure(s) Performed: Procedure(s): TOTAL RIGHT KNEE ARTHROPLASTY (Right)  Patient Location: PACU  Anesthesia Type:MAC and Spinal  Level of Consciousness: awake, alert , oriented and sedated  Airway & Oxygen Therapy: Patient Spontanous Breathing and Patient connected to nasal cannula oxygen  Post-op Assessment: Report given to RN, Post -op Vital signs reviewed and stable and Patient moving all extremities  Post vital signs: Reviewed and stable  Last Vitals:  Filed Vitals:   11/15/14 1425  BP: 103/62  Pulse:   Temp: 36.5 C  Resp:     Complications: No apparent anesthesia complications

## 2014-11-16 ENCOUNTER — Encounter (HOSPITAL_COMMUNITY): Payer: Self-pay | Admitting: Orthopedic Surgery

## 2014-11-16 LAB — BASIC METABOLIC PANEL
Anion gap: 5 (ref 5–15)
BUN: 11 mg/dL (ref 6–20)
CALCIUM: 8.8 mg/dL — AB (ref 8.9–10.3)
CO2: 26 mmol/L (ref 22–32)
CREATININE: 1.03 mg/dL — AB (ref 0.44–1.00)
Chloride: 104 mmol/L (ref 101–111)
GFR calc Af Amer: >60 mL/min (ref >60)
GFR calc non Af Amer: >60 mL/min (ref >60)
GLUCOSE: 147 mg/dL — AB (ref 65–99)
Potassium: 4.1 mmol/L (ref 3.5–5.1)
Sodium: 135 mmol/L (ref 135–145)

## 2014-11-16 LAB — CBC
HEMATOCRIT: 38.4 % (ref 36.0–46.0)
Hemoglobin: 12.5 g/dL (ref 12.0–15.0)
MCH: 31.6 pg (ref 26.0–34.0)
MCHC: 32.6 g/dL (ref 30.0–36.0)
MCV: 97.2 fL (ref 78.0–100.0)
Platelets: 199 10*3/uL (ref 150–400)
RBC: 3.95 MIL/uL (ref 3.87–5.11)
RDW: 12.7 % (ref 11.5–15.5)
WBC: 9.9 10*3/uL (ref 4.0–10.5)

## 2014-11-16 LAB — GLUCOSE, CAPILLARY
GLUCOSE-CAPILLARY: 121 mg/dL — AB (ref 65–99)
Glucose-Capillary: 121 mg/dL — ABNORMAL HIGH (ref 65–99)
Glucose-Capillary: 130 mg/dL — ABNORMAL HIGH (ref 65–99)
Glucose-Capillary: 126 mg/dL — ABNORMAL HIGH (ref 65–99)

## 2014-11-16 NOTE — Progress Notes (Signed)
Orthopedic Tech Progress Note Patient Details:  [name] Sep 29, 1960 [id number]  Patient ID: [name], female   DOB: 12-Aug-1960, 54 y.o.   MRN: [id number] Placed pt's rle on cpm @ 0-40 degrees @1510   Hildred Priest 11/16/2014, 3:13 PM

## 2014-11-16 NOTE — Progress Notes (Signed)
Subjective: Pt stable - pain ok   Objective: Vital signs in last 24 hours: Temp:  [97.5 F (36.4 C)-98.8 F (37.1 C)] 98.8 F (37.1 C) (11/02 0533) Pulse Rate:  [76-103] 93 (11/02 0533) Resp:  [4-23] 16 (11/02 0533) BP: (87-129)/(50-75) 116/50 mmHg (11/02 0533) SpO2:  [91 %-100 %] 95 % (11/02 0533)  Intake/Output from previous day: 11/01 0701 - 11/02 0700 In: 1300 [I.V.:1300] Out: 100 [Blood:100] Intake/Output this shift:    Exam:  Neurovascular intact Sensation intact distally Intact pulses distally  Labs: No results for input(s): HGB in the last 72 hours. No results for input(s): WBC, RBC, HCT, PLT in the last 72 hours. No results for input(s): NA, K, CL, CO2, BUN, CREATININE, GLUCOSE, CALCIUM in the last 72 hours. No results for input(s): LABPT, INR in the last 72 hours.  Assessment/Plan: Plan PT and CPM today   DEAN,GREGORY SCOTT 11/16/2014, 7:20 AM

## 2014-11-16 NOTE — Evaluation (Signed)
Physical Therapy Evaluation Patient Details Name: [name] MRN: [id number] DOB: 07/15/1960 Today's Date: 11/16/2014   History of Present Illness  Pt presents for right TKA with PMH of OA, DM, sarcoidosis, lichen planus, OSA  Clinical Impression  Pt is s/p TKA resulting in the deficits listed below (see PT Problem List). Pt mobility limited by generalized weakness, lethargy, and fear of falling. Requires +2 assist for bed to chair transfers. Pt will benefit from skilled PT to increase their independence and safety with mobility to allow discharge to the venue listed below.      Follow Up Recommendations SNF;Supervision/Assistance - 24 hour    Equipment Recommendations  None recommended by PT    Recommendations for Other Services OT consult     Precautions / Restrictions Precautions Precautions: Fall;Knee Precaution Comments: pt fell day before surgery at home. Reviewed proper positioning Required Braces or Orthoses: Knee Immobilizer - Right Knee Immobilizer - Right: On except when in CPM Restrictions Weight Bearing Restrictions: Yes RLE Weight Bearing: Weight bearing as tolerated Other Position/Activity Restrictions: monitor O2 sats, when PT first arrived, pt very lethargic with O2 off from breakfast, not responding to questions, O2 sats 62%. RN notified and present and PT eval deferred to the current time.      Mobility  Bed Mobility Overal bed mobility: Needs Assistance;+2 for physical assistance Bed Mobility: Supine to Sit;Sit to Supine     Supine to sit: +2 for physical assistance;Mod assist Sit to supine: Max assist;+2 for physical assistance   General bed mobility comments: pt unable to move RLE, mod A for LE's off bed and elevating trunk to sitting position. Max A for legs back into bed to return to supine  Transfers Overall transfer level: Needs assistance Equipment used: Rolling walker (2 wheeled) Transfers: Sit to/from [employer] Sit to Stand: Mod assist;+2 physical assistance Stand pivot transfers: Mod assist;+2 physical assistance       General transfer comment: pt very fearful of falling and with generalized weakness of LLE making transfers difficult. Pt transferred to the left to [facility]. Attempted to stand to RW but pt unable to do so, so given bilateral UE support by therapist and tech and manual facilitation at hips. Pt unable to step feet in partial standing, lifted hips just enought to clear rail and move to [facility]  Ambulation/Gait             General Gait Details: unable  Stairs            Wheelchair Mobility    Modified Rankin (Stroke Patients Only)       Balance Overall balance assessment: Needs assistance Sitting-balance support: Single extremity supported Sitting balance-Leahy Scale: Poor     Standing balance support: Bilateral upper extremity supported Standing balance-Leahy Scale: Poor                               Pertinent Vitals/Pain Pain Assessment: Faces Faces Pain Scale: Hurts even more Pain Location: right knee Pain Descriptors / Indicators: Aching Pain Intervention(s): Limited activity within patient's tolerance;Monitored during session;Premedicated before session;Repositioned    Home Living Family/patient expects to be discharged to:: Skilled nursing facility Living Arrangements: Alone                    Prior Function Level of Independence: Independent with assistive device(s)         Comments: ambulates with 4 wheel RW  Hand Dominance        Extremity/Trunk Assessment   Upper Extremity Assessment: Defer to OT evaluation;Generalized weakness           Lower Extremity Assessment: RLE deficits/detail;LLE deficits/detail RLE Deficits / Details: hip flex 1/5, knee ext 2/5, knee flex 2/5 LLE Deficits / Details: hip flex 3/5, knee ext 3/5, knee flex 3/5  Cervical / Trunk Assessment: Kyphotic  Communication    Communication: No difficulties  Cognition Arousal/Alertness: Lethargic;Suspect due to medications Behavior During Therapy: Flat affect;Anxious Overall Cognitive Status: Impaired/Different from baseline Area of Impairment: Following commands;Problem solving       Following Commands: Follows one step commands with increased time;Follows multi-step commands with increased time     Problem Solving: Slow processing;Requires verbal cues General Comments: lethargic today.     General Comments General comments (skin integrity, edema, etc.): O2 sats 92% on 3L O2    Exercises Total Joint Exercises Ankle Circles/Pumps: AROM;Both;10 reps;Supine Quad Sets: AROM;Right;10 reps;Seated Goniometric ROM: 0-60      Assessment/Plan    PT Assessment Patient needs continued PT services  PT Diagnosis Difficulty walking;Generalized weakness;Acute pain   PT Problem List Decreased strength;Decreased range of motion;Decreased activity tolerance;Decreased balance;Decreased mobility;Decreased knowledge of use of DME;Decreased safety awareness;Decreased knowledge of precautions;Pain;Decreased cognition;Decreased coordination  PT Treatment Interventions DME instruction;Gait training;Functional mobility training;Therapeutic activities;Therapeutic exercise;Balance training;Neuromuscular re-education;Cognitive remediation;Patient/family education   PT Goals (Current goals can be found in the Care Plan section) Acute Rehab PT Goals Patient Stated Goal: none stated PT Goal Formulation: With patient Time For Goal Achievement: 11/23/14 Potential to Achieve Goals: Fair    Frequency Min 5X/week   Barriers to discharge Decreased caregiver support      Co-evaluation               End of Session Equipment Utilized During Treatment: Gait belt;Right knee immobilizer Activity Tolerance: Patient limited by lethargy Patient left: in chair;with call bell/phone within reach Nurse Communication: Mobility  status         Time: 1130-1153 PT Time Calculation (min) (ACUTE ONLY): 23 min   Charges:   PT Evaluation $Initial PT Evaluation Tier I: 1 Procedure PT Treatments $Therapeutic Activity: 8-22 mins   PT G Codes:      Leighton Roach, PT  Acute Rehab Services  [place], [place] 11/16/2014, 1:34 PM

## 2014-11-16 NOTE — Progress Notes (Signed)
Physical Therapy Treatment Patient Details Name: [name] MRN: [id number] DOB: 03-Jan-1961 Today's Date: 11/16/2014    History of Present Illness Pt presents for right TKA with PMH of OA, DM, sarcoidosis, lichen planus, OSA    PT Comments    Pt progressing slowly as she continues to be very fearful of falling and hesitant to try to transfer. +2/3 assist needed for safety with transfers. PT will continue to follow.   Follow Up Recommendations  SNF;Supervision/Assistance - 24 hour     Equipment Recommendations  None recommended by PT    Recommendations for Other Services OT consult     Precautions / Restrictions Precautions Precautions: Fall;Knee Precaution Booklet Issued: No Precaution Comments: pt fell day before surgery at home. Reviewed proper positioning Required Braces or Orthoses: Knee Immobilizer - Right Knee Immobilizer - Right: On except when in CPM Restrictions Weight Bearing Restrictions: Yes RLE Weight Bearing: Weight bearing as tolerated Other Position/Activity Restrictions: monitor O2 sats, when PT first arrived, pt very lethargic with O2 off from breakfast, not responding to questions, O2 sats 62%. RN notified and present and PT eval deferred to the current time.    Mobility  Bed Mobility Overal bed mobility: Needs Assistance;+2 for physical assistance Bed Mobility: Sit to Supine     Supine to sit: +2 for physical assistance;Mod assist Sit to supine: +2 for physical assistance;Max assist   General bed mobility comments: Mod A for LLE into bed, Max A for RLE into bed and +2 for safety with lying back down  Transfers Overall transfer level: Needs assistance Equipment used: Rolling walker (2 wheeled) Transfers: Squat Pivot Transfers Sit to Stand: Mod assist;+2 physical assistance Stand pivot transfers: Mod assist;+2 physical assistance Squat pivot transfers: +2 physical assistance;Mod assist     General transfer comment: pt still limited  by fear, begins to initiate transfer and then asks to wait multiple times. Mod A +2 for power as well as support during squat transfer. Manual facilitation at hips  Ambulation/Gait             General Gait Details: unable   Stairs            Wheelchair Mobility    Modified Rankin (Stroke Patients Only)       Balance Overall balance assessment: Needs assistance Sitting-balance support: No upper extremity supported Sitting balance-Leahy Scale: Fair     Standing balance support: Bilateral upper extremity supported Standing balance-Leahy Scale: Zero                      Cognition Arousal/Alertness: Lethargic;Suspect due to medications Behavior During Therapy: Anxious Overall Cognitive Status: Impaired/Different from baseline Area of Impairment: Following commands;Problem solving       Following Commands: Follows one step commands with increased time;Follows multi-step commands with increased time     Problem Solving: Slow processing;Requires verbal cues General Comments: pt more alert second session but still falling asleep if not continually aroused    Exercises Total Joint Exercises Ankle Circles/Pumps: AROM;Both;10 reps;Supine Quad Sets: AROM;Right;10 reps;Seated Knee Flexion: AROM;5 reps;Right;Seated Goniometric ROM: 0-60    General Comments General comments (skin integrity, edema, etc.): currently, pt only able to transfer to left so has to sit on mobile surface or be able to bring next surface to her left side      Pertinent Vitals/Pain Pain Assessment: Faces Faces Pain Scale: Hurts even more Pain Location: right knee Pain Descriptors / Indicators: Aching Pain Intervention(s): Limited activity within patient's tolerance;Monitored during  session;Premedicated before session;Repositioned    Home Living Family/patient expects to be discharged to:: Skilled nursing facility Living Arrangements: Alone                  Prior Function  Level of Independence: Independent with assistive device(s)      Comments: ambulates with 4 wheel RW   PT Goals (current goals can now be found in the care plan section) Acute Rehab PT Goals Patient Stated Goal: be able to take care of herself PT Goal Formulation: With patient Time For Goal Achievement: 11/23/14 Potential to Achieve Goals: Fair Progress towards PT goals: Progressing toward goals    Frequency  Min 5X/week    PT Plan Current plan remains appropriate    Co-evaluation             End of Session Equipment Utilized During Treatment: Gait belt;Right knee immobilizer Activity Tolerance: Patient limited by pain;Treatment limited secondary to agitation Patient left: in bed;with call bell/phone within reach     Time: 1216-1244 PT Time Calculation (min) (ACUTE ONLY): 28 min  Charges:  $Therapeutic Activity: 23-37 mins                    G Codes:     Leighton Roach, PT  Acute Rehab Services  [place], [place] 11/16/2014, 2:12 PM

## 2014-11-16 NOTE — Op Note (Signed)
[name], [name] NO.:  [id number]  MEDICAL RECORD NO.:  [id number]  LOCATION:  [id number]                        FACILITY:  [facility]  PHYSICIAN:  Anderson Malta, M.D.    DATE OF BIRTH:  1960-07-05  DATE OF PROCEDURE: DATE OF DISCHARGE:                              OPERATIVE REPORT   PREOPERATIVE DIAGNOSIS:  Right knee arthritis.  POSTOPERATIVE DIAGNOSIS:  Right knee arthritis.  PROCEDURE:  Right total knee replacement using posterior cruciate sacrificing Triathlon cemented 3 femur, 3 tibia, 11-mm poly insert, 29- mm offset 3 PEG cemented patella.  Also an excision of anterior skin lesion, likely sarcoid.  SURGEON:  Anderson Malta, M.D.  ASSISTANT:  Laure Kidney, RNFA.  INDICATIONS:  [name] is a patient with end-stage right knee arthritis, presents for operative management after explanation of risks and benefits.  PROCEDURE IN DETAIL:  The patient was brought to the operating room where spinal anesthetic was induced.  Preop IV antibiotics were administered.  Time-out was called.  Right leg was prescrubbed with alcohol and Betadine, allowed to air dry, prepped with DuraPrep solution and draped in a sterile manner.  [name] was used to cover the operative field.  Next, the patient did have about a 1.5 cm x 1.5 cm skin lesion consistent with known history of sarcoid, which was directly in line with the incision.  This lesion was ellipsed out with about 5 mm of tissue surrounding it.  Anterior approach to knee was then made.  Skin and subcu tissues were sharply divided.  Median parapatellar approach was made and marked with #1 Vicryl suture.  Lateral patellofemoral ligament was released.  Soft tissue was removed from the anterior distal femur.  Minimal soft tissue dissection performed on the medial aspect of the tibia.  Osteophytes were removed.  Severe arthritis was present. Intramedullary alignment was then used to make a cut perpendicular to the mechanical  axis of the tibia with posterior neurovascular structures and collaterals protected.  This cut initially was 2 mm off the most affected tibial side, recut to 10 mm off the most affected lateral tibial plateau.  Flat cut was confirmed intramedullary alignment and then used to make an 8-mm cut off the distal femur.  Preop flexion contracture was about 10 degrees.  At this time, chamfer and box cuts were made, menisci were excised.  Tibia was keel punched with a 50-mm stem to accept a size 3 tibia.  Patella was then cut freehand from 20 down to 13 and 3 PEG 29-mm patella was placed.  At this time with trial components in position, the knee achieved full extension, full flexion, excellent patellar tracking using no-thumbs technique.  This was trialed with both 9 and 11-mm spacer.  The 11-mm spacer gave slightly more varus and valgus stability particularly in this valgus knee.  Trial components were removed.  Thorough irrigation with 3 liters of irrigating solution performed.  Exparel placed into the fat pad and capsular tissue. Tranexamic acid topically applied for 3 minutes.  Then, the true components were cemented into position including the 11-mm spacer with same stability parameters were maintained.  Tourniquet was released and removed.  Bleeding points were encountered and controlled using electrocautery.  A thorough irrigation  was again performed.  Excess cement removed.  Incision was then closed over bolster using interrupted inverted #1 Vicryl suture, 0 Vicryl suture, 2-0 Vicryl suture and a 3-0 pullout Prolene.  Solution of Marcaine, morphine, clonidine injected into the knee.  A bulky dressing applied after Mepilex was applied. Knee immobilizer applied.  The patient tolerated the procedure well without immediate complication, transferred to the recovery room in stable condition.     Anderson Malta, M.D.     GSD/MEDQ  D:  11/15/2014  T:  11/16/2014  Job:  [id number]

## 2014-11-17 LAB — CBC
HCT: 34 % — ABNORMAL LOW (ref 36.0–46.0)
HEMOGLOBIN: 11.1 g/dL — AB (ref 12.0–15.0)
MCH: 32.1 pg (ref 26.0–34.0)
MCHC: 32.6 g/dL (ref 30.0–36.0)
MCV: 98.3 fL (ref 78.0–100.0)
PLATELETS: 166 10*3/uL (ref 150–400)
RBC: 3.46 MIL/uL — ABNORMAL LOW (ref 3.87–5.11)
RDW: 12.9 % (ref 11.5–15.5)
WBC: 9.2 10*3/uL (ref 4.0–10.5)

## 2014-11-17 LAB — GLUCOSE, CAPILLARY
GLUCOSE-CAPILLARY: 107 mg/dL — AB (ref 65–99)
GLUCOSE-CAPILLARY: 128 mg/dL — AB (ref 65–99)
Glucose-Capillary: 103 mg/dL — ABNORMAL HIGH (ref 65–99)
Glucose-Capillary: 97 mg/dL (ref 65–99)

## 2014-11-17 MED ORDER — SODIUM CHLORIDE 0.9 % IV SOLN
Freq: Once | INTRAVENOUS | Status: AC
  Administered 2014-11-17: 16:00:00 via INTRAVENOUS

## 2014-11-17 MED ORDER — OXYCODONE HCL 30 MG PO TABS: 60.0000 mg | ORAL_TABLET | ORAL | Status: DC | PRN

## 2014-11-17 MED ORDER — OXYCODONE HCL 5 MG PO TABS
30.0000 mg | ORAL_TABLET | Freq: Three times a day (TID) | ORAL | Status: DC | PRN
  Administered 2014-11-17 – 2014-11-18 (×2): 30 mg via ORAL
  Filled 2014-11-17 (×2): qty 6

## 2014-11-17 MED ORDER — RIVAROXABAN 10 MG PO TABS: 10.0000 mg | ORAL_TABLET | Freq: Every day | ORAL | Status: DC

## 2014-11-17 NOTE — Progress Notes (Signed)
Subjective: Pt stable - slow progress with PT   Objective: Vital signs in last 24 hours: Temp:  [98.2 F (36.8 C)-99.1 F (37.3 C)] 98.4 F (36.9 C) (11/03 0448) Pulse Rate:  [86-94] 86 (11/03 0748) Resp:  [17-18] 17 (11/03 0448) BP: (101-117)/(48-62) 104/62 mmHg (11/03 0448) SpO2:  [92 %-97 %] 94 % (11/03 0448)  Intake/Output from previous day: 11/02 0701 - 11/03 0700 In: 1542.5 [P.O.:480; I.V.:1062.5] Out: 1000 [Urine:1000] Intake/Output this shift:    Exam:  Sensation intact distally Intact pulses distally  Labs:  Recent Labs  11/16/14 0645 11/17/14 0612  HGB 12.5 11.1*    Recent Labs  11/16/14 0645 11/17/14 0612  WBC 9.9 9.2  RBC 3.95 3.46*  HCT 38.4 34.0*  PLT 199 166    Recent Labs  11/16/14 0645  NA 135  K 4.1  CL 104  CO2 26  BUN 11  CREATININE 1.03*  GLUCOSE 147*  CALCIUM 8.8*   No results for input(s): LABPT, INR in the last 72 hours.  Assessment/Plan: Plan for dc to snf am - rx and dc summ done   DEAN,GREGORY SCOTT 11/17/2014, 8:27 AM

## 2014-11-17 NOTE — Progress Notes (Signed)
OT Cancellation Note  Patient Details Name: [name] MRN: [id number] DOB: 10/19/1960   Cancelled Treatment:    Reason Eval/Treat Not Completed: Other (comment) Pt is Medicare/Medicaid and current D/C plan is SNF. No apparent immediate acute care OT needs, therefore will defer OT to SNF. If OT eval is needed please call Acute Rehab Dept. at [phone] or text page OT at [phone].  [place], OTR/L  [phone] 11/17/2014 11/17/2014, 6:21 AM

## 2014-11-17 NOTE — Discharge Summary (Signed)
Physician Discharge Summary  Patient ID: [name] MRN: [id number] DOB/AGE: 1960/08/08 54 y.o.  Admit date: 11/15/2014 Discharge date: 11/18/2014  Admission Diagnoses:  Active Problems:   Arthritis of knee, degenerative   Discharge Diagnoses:  Same  Surgeries: Procedure(s): TOTAL RIGHT KNEE ARTHROPLASTY on 11/15/2014   Consultants:    Discharged Condition: Stable  Hospital Course: [name] is an 54 y.o. female who was admitted 11/15/2014 with a chief complaint of knee pain, and found to have a diagnosis of knee arthritis.  They were brought to the operating room on 11/15/2014 and underwent the above named procedures.Patient made slow progress with PT and was dced to snf in good condition    Antibiotics given:  Anti-infectives    Start     Dose/Rate Route Frequency Ordered Stop   11/15/14 2000  ceFAZolin (ANCEF) IVPB 2 g/50 mL premix     2 g 100 mL/hr over 30 Minutes Intravenous Every 6 hours 11/15/14 1814 11/16/14 0256   11/15/14 1030  ceFAZolin (ANCEF) IVPB 2 g/50 mL premix     2 g 100 mL/hr over 30 Minutes Intravenous To [facility] 11/14/14 1102 11/15/14 1116    .  Recent vital signs:  Filed Vitals:   11/17/14 0748  BP:   Pulse: 86  Temp:   Resp:     Recent laboratory studies:  Results for orders placed or performed during the hospital encounter of 11/15/14  Glucose, capillary  Result Value Ref Range   Glucose-Capillary 96 65 - 99 mg/dL  Glucose, capillary  Result Value Ref Range   Glucose-Capillary 75 65 - 99 mg/dL  Glucose, capillary  Result Value Ref Range   Glucose-Capillary 79 65 - 99 mg/dL  CBC  Result Value Ref Range   WBC 9.9 4.0 - 10.5 K/uL   RBC 3.95 3.87 - 5.11 MIL/uL   Hemoglobin 12.5 12.0 - 15.0 g/dL   HCT 38.4 36.0 - 46.0 %   MCV 97.2 78.0 - 100.0 fL   MCH 31.6 26.0 - 34.0 pg   MCHC 32.6 30.0 - 36.0 g/dL   RDW 12.7 11.5 - 15.5 %   Platelets 199 150 - 400 K/uL  Basic metabolic panel  Result Value Ref  Range   Sodium 135 135 - 145 mmol/L   Potassium 4.1 3.5 - 5.1 mmol/L   Chloride 104 101 - 111 mmol/L   CO2 26 22 - 32 mmol/L   Glucose, Bld 147 (H) 65 - 99 mg/dL   BUN 11 6 - 20 mg/dL   Creatinine, Ser 1.03 (H) 0.44 - 1.00 mg/dL   Calcium 8.8 (L) 8.9 - 10.3 mg/dL   GFR calc non Af Amer >60 >60 mL/min   GFR calc Af Amer >60 >60 mL/min   Anion gap 5 5 - 15  Glucose, capillary  Result Value Ref Range   Glucose-Capillary 127 (H) 65 - 99 mg/dL  Glucose, capillary  Result Value Ref Range   Glucose-Capillary 121 (H) 65 - 99 mg/dL  Glucose, capillary  Result Value Ref Range   Glucose-Capillary 130 (H) 65 - 99 mg/dL  Glucose, capillary  Result Value Ref Range   Glucose-Capillary 121 (H) 65 - 99 mg/dL  CBC  Result Value Ref Range   WBC 9.2 4.0 - 10.5 K/uL   RBC 3.46 (L) 3.87 - 5.11 MIL/uL   Hemoglobin 11.1 (L) 12.0 - 15.0 g/dL   HCT 34.0 (L) 36.0 - 46.0 %   MCV 98.3 78.0 - 100.0 fL   MCH  32.1 26.0 - 34.0 pg   MCHC 32.6 30.0 - 36.0 g/dL   RDW 12.9 11.5 - 15.5 %   Platelets 166 150 - 400 K/uL  Glucose, capillary  Result Value Ref Range   Glucose-Capillary 126 (H) 65 - 99 mg/dL  Glucose, capillary  Result Value Ref Range   Glucose-Capillary 107 (H) 65 - 99 mg/dL    Discharge Medications:     Medication List    STOP taking these medications        VOLTAREN 1 % Gel  Generic drug:  diclofenac sodium      TAKE these medications        carvedilol 12.5 MG tablet  Commonly known as:  COREG  TAKE 1 TABLET BY MOUTH TWICE DAILY WITH MEALS     cholecalciferol 1000 UNITS tablet  Commonly known as:  VITAMIN D  Take 1 tablet (1,000 Units total) by mouth daily.     clobetasol 0.05 % external solution  Commonly known as:  TEMOVATE  Apply 1 application topically 2 (two) times daily.     diazepam 10 MG tablet  Commonly known as:  VALIUM  Take 5 mg by mouth daily as needed for anxiety (before procedures).     escitalopram 20 MG tablet  Commonly known as:  LEXAPRO  TAKE 1  TABLET BY MOUTH DAILY     freestyle lancets  1 each by Other route 2 (two) times daily. Use as instructed     FREESTYLE LITE Devi  Use to check blood sugar 2 times per day     glucose blood test strip  Commonly known as:  FREESTYLE LITE  TEST TWICE DAILY     KLOR-CON 10 10 MEQ tablet  Generic drug:  potassium chloride  TAKE 1 TABLET BY MOUTH DAILY     metFORMIN 500 MG 24 hr tablet  Commonly known as:  GLUCOPHAGE-XR  Take 500 mg by mouth daily with breakfast.     morphine 15 MG 12 hr tablet  Commonly known as:  MS CONTIN  Take 1-2 tablets (15-30 mg total) by mouth at bedtime. Please fill on or after 10/09/14     NEXIUM 40 MG capsule  Generic drug:  esomeprazole  TAKE ONE CAPSULE BY MOUTH EVERY DAY BEFORE BREAKFAST     nortriptyline 25 MG capsule  Commonly known as:  PAMELOR  TAKE 1 TO 2 CAPSULES BY MOUTH EVERY NIGHT AT BEDTIME AS NEEDED FOR INSOMNIA     oxycodone 30 MG immediate release tablet  Commonly known as:  ROXICODONE  Take 2 tablets (60 mg total) by mouth every 4 (four) hours as needed for severe pain.     rivaroxaban 10 MG Tabs tablet  Commonly known as:  XARELTO  Take 1 tablet (10 mg total) by mouth daily with breakfast.     topiramate 50 MG tablet  Commonly known as:  TOPAMAX  TAKE 1 TABLET BY MOUTH TWICE DAILY        Diagnostic Studies: Mm Digital Diagnostic Unilat L  11/11/2014  CLINICAL DATA:  Post biopsy mammogram of the left breast. EXAM: DIAGNOSTIC LEFT MAMMOGRAM POST STEREOTACTIC BIOPSY COMPARISON:  Previous exam(s). FINDINGS: Mammographic images were obtained following stereotactic guided biopsy of calcifications in the upper-outer quadrant of the left breast. The X shaped biopsy marking clip is appropriately positioned at the intended site of biopsy in the left breast. IMPRESSION: Appropriate positioning of the X shaped biopsy marking clip in the upper-outer quadrant of the left breast at the intended  site of biopsy. Final Assessment: Post Procedure  Mammograms for Marker Placement Electronically Signed   By: Ammie Ferrier M.D.   On: 11/11/2014 12:25   Mm Digital Diagnostic Unilat L  11/09/2014  CLINICAL DATA:  Patient was called back from screening mammogram for left breast calcifications. EXAM: DIGITAL DIAGNOSTIC LEFT MAMMOGRAM COMPARISON:  Previous exam(s). ACR Breast Density Category b: There are scattered areas of fibroglandular density. FINDINGS: Magnification views of the upper-outer quadrant of the left breast was obtained. There is 10 mm of grouped fine calcifications that very in size and shape. Associated with the grouped calcification is a larger calcification that layers consistent with milk of calcium. The remainder of the calcifications do not appear to be milk of calcium. There is no associated mass. IMPRESSION: Suspicious developing calcifications in the upper-outer quadrant of the left breast. RECOMMENDATION: Stereotactic biopsy of the left breast calcifications is recommended. This will be scheduled at the patient's convenience. I have discussed the findings and recommendations with the patient. Results were also provided in writing at the conclusion of the visit. If applicable, a reminder letter will be sent to the patient regarding the next appointment. BI-RADS CATEGORY  4: Suspicious. Electronically Signed   By: Lillia Mountain M.D.   On: 11/09/2014 10:17   Mm Digital Screening Bilateral  10/26/2014  CLINICAL DATA:  Screening. EXAM: DIGITAL SCREENING BILATERAL MAMMOGRAM WITH CAD COMPARISON:  Previous exam(s). ACR Breast Density Category b: There are scattered areas of fibroglandular density. FINDINGS: In the left breast, calcifications warrant further evaluation. In the right breast, no findings suspicious for malignancy. Images were processed with CAD. IMPRESSION: Further evaluation is suggested for calcifications in the left breast. RECOMMENDATION: Diagnostic mammogram of the left breast. (Code:[id number]) The patient will be  contacted regarding the findings, and additional imaging will be scheduled. BI-RADS CATEGORY  0: Incomplete. Need additional imaging evaluation and/or prior mammograms for comparison. Electronically Signed   By: Marin Olp M.D.   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 This was found to be concordant by Dr. Ammie Ferrier. Pathology was discussed with the patient by telephone. She reported doing well after the biopsy. Post biopsy instructions and care were reviewed and her questions were answered. She was encouraged to call [facility] for any additional concerns. She was asked to return for annual screening mammography. Pathology results reported by Susa Raring RN, BSN on November 14, 2014. Electronically Signed   By: Ammie Ferrier M.D.   On: 11/14/2014 15:15  11/16/2014  CLINICAL DATA:  54 year old female presenting for stereotactic biopsy of left breast calcifications. EXAM: LEFT BREAST STEREOTACTIC CORE NEEDLE BIOPSY COMPARISON:  Previous exams. FINDINGS: The patient and I discussed the procedure of stereotactic-guided biopsy including benefits and alternatives. We discussed the high likelihood of a successful procedure. We discussed the risks of the procedure including infection, bleeding, tissue injury, clip migration, and inadequate sampling. Informed written consent was given. The usual time out protocol was performed immediately prior to the procedure. Using sterile technique and 2% Lidocaine as local anesthetic, under stereotactic guidance, a 9 gauge vacuum assisted device was used to perform core needle biopsy of calcifications in the upper-outer quadrant of the left breast using a lateral approach. Specimen radiograph was performed showing calcifications in several  samples. Specimens with calcifications  are identified for pathology. At the conclusion of the procedure, a X tissue marker clip was deployed into the biopsy cavity. Follow-up 2-view mammogram was performed and dictated separately. IMPRESSION: Stereotactic-guided biopsy of calcifications in the upper-outer quadrant of the left breast. No apparent complications. Electronically Signed: By: Ammie Ferrier M.D. On: 11/11/2014 12:23    Disposition: 01-Home or Self Care      Discharge Instructions    Call MD / Call 911    Complete by:  As directed   If you experience chest pain or shortness of breath, CALL 911 and be transported to the hospital emergency room.  If you develope a fever above 101 F, pus (white drainage) or increased drainage or redness at the wound, or calf pain, call your surgeon's office.     Constipation Prevention    Complete by:  As directed   Drink plenty of fluids.  Prune juice may be helpful.  You may use a stool softener, such as Colace (over the counter) 100 mg twice a day.  Use MiraLax (over the counter) for constipation as needed.     Diet - low sodium heart healthy    Complete by:  As directed      Discharge instructions    Complete by:  As directed   Keep incision dry Weight bearing as tolerated CPM 6 hours per day [place] items at home which could result in a fall. This includes throw rugs or furniture in walking pathways ICE to the affected joint every three hours while awake for 30 minutes at a time, for at least the first 3-5 days, and then as needed for pain and swelling.  Continue to use ice for pain and swelling. You may notice swelling that will progress down to the foot and ankle.  This is normal after surgery.  Elevate your leg when you are not up walking on it.   Continue to use the breathing machine you got in the hospital (incentive spirometer) which will help keep your temperature down.  It is common for your  temperature to cycle up and down following surgery, especially at night when you are not up moving around and exerting yourself.  The breathing machine keeps your lungs expanded and your temperature down.   DIET:  As you were doing prior to hospitalization, we recommend a well-balanced diet.  DRESSING / WOUND CARE / SHOWERING  Keep the surgical dressing until follow up.  The dressing is water proof, so you can shower without any extra covering.  IF THE DRESSING FALLS OFF or the wound gets wet inside, change the dressing with sterile gauze.  Please use good hand washing techniques before changing the dressing.  Do not use any lotions or creams on the incision until instructed by your surgeon.    ACTIVITY  Increase activity slowly as tolerated, but follow the weight bearing instructions below.   No driving for 6 weeks or until further direction given by your physician.  You cannot drive while taking narcotics.  No lifting or carrying greater than 10 lbs. until further directed by your surgeon. Avoid periods of inactivity such as sitting longer than an hour when not asleep. This helps prevent blood clots.  You may return to work once you are authorized by your doctor.     WEIGHT BEARING   Weight bearing as tolerated with assist device (walker, cane, etc) as directed, use it as long as suggested by your surgeon or therapist,  typically at least 4-6 weeks.   EXERCISES  Results after joint replacement surgery are often greatly improved when you follow the exercise, range of motion and muscle strengthening exercises prescribed by your doctor. Safety measures are also important to protect the joint from further injury. Any time any of these exercises cause you to have increased pain or swelling, decrease what you are doing until you are comfortable again and then slowly increase them. If you have problems or questions, call your caregiver or physical therapist for advice.   Rehabilitation is  important following a joint replacement. After just a few days of immobilization, the muscles of the leg can become weakened and shrink (atrophy).  These exercises are designed to build up the tone and strength of the thigh and leg muscles and to improve motion. Often times heat used for twenty to thirty minutes before working out will loosen up your tissues and help with improving the range of motion but do not use heat for the first two weeks following surgery (sometimes heat can increase post-operative swelling).   These exercises can be done on a training (exercise) mat, on the floor, on a table or on a bed. Use whatever works the best and is most comfortable for you.    Use music or television while you are exercising so that the exercises are a pleasant break in your day. This will make your life better with the exercises acting as a break in your routine that you can look forward to.   Perform all exercises about fifteen times, three times per day or as directed.  You should exercise both the operative leg and the other leg as well.   Exercises include:   Quad Sets - Tighten up the muscle on the front of the thigh (Quad) and hold for 5-10 seconds.   Straight Leg Raises - With your knee straight (if you were given a brace, keep it on), lift the leg to 60 degrees, hold for 3 seconds, and slowly lower the leg.  Perform this exercise against resistance later as your leg gets stronger.  Leg Slides: Lying on your back, slowly slide your foot toward your buttocks, bending your knee up off the floor (only go as far as is comfortable). Then slowly slide your foot back down until your leg is flat on the floor again.  Angel Wings: Lying on your back spread your legs to the side as far apart as you can without causing discomfort.  Hamstring Strength:  Lying on your back, push your heel against the floor with your leg straight by tightening up the muscles of your buttocks.  Repeat, but this time bend your knee  to a comfortable angle, and push your heel against the floor.  You may put a pillow under the heel to make it more comfortable if necessary.   A rehabilitation program following joint replacement surgery can speed recovery and prevent re-injury in the future due to weakened muscles. Contact your doctor or a physical therapist for more information on knee rehabilitation.    CONSTIPATION  Constipation is defined medically as fewer than three stools per week and severe constipation as less than one stool per week.  Even if you have a regular bowel pattern at home, your normal regimen is likely to be disrupted due to multiple reasons following surgery.  Combination of anesthesia, postoperative narcotics, change in appetite and fluid intake all can affect your bowels.   YOU MUST use at least one of the  following options; they are listed in order of increasing strength to get the job done.  They are all available over the counter, and you may need to use some, POSSIBLY even all of these options:    Drink plenty of fluids (prune juice may be helpful) and high fiber foods Colace 100 mg by mouth twice a day  Senokot for constipation as directed and as needed Dulcolax (bisacodyl), take with full glass of water  Miralax (polyethylene glycol) once or twice a day as needed.  If you have tried all these things and are unable to have a bowel movement in the first 3-4 days after surgery call either your surgeon or your primary doctor.    If you experience loose stools or diarrhea, hold the medications until you stool forms back up.  If your symptoms do not get better within 1 week or if they get worse, check with your doctor.  If you experience "the worst abdominal pain ever" or develop nausea or vomiting, please contact the office immediately for further recommendations for treatment.   ITCHING:  If you experience itching with your medications, try taking only a single pain pill, or even half a pain pill at a  time.  You can also use Benadryl over the counter for itching or also to help with sleep.   TED HOSE STOCKINGS:  Use stockings on both legs until for at least 2 weeks or as directed by physician office. They may be removed at night for sleeping.  MEDICATIONS:  See your medication summary on the "After Visit Summary" that nursing will review with you.  You may have some home medications which will be placed on hold until you complete the course of blood thinner medication.  It is important for you to complete the blood thinner medication as prescribed.  PRECAUTIONS:  If you experience chest pain or shortness of breath - call 911 immediately for transfer to the hospital emergency department.   If you develop a fever greater that 101 F, purulent drainage from wound, increased redness or drainage from wound, foul odor from the wound/dressing, or calf pain - CONTACT YOUR SURGEON.                                                   FOLLOW-UP APPOINTMENTS:  If you do not already have a post-op appointment, please call the office for an appointment to be seen by your surgeon.  Guidelines for how soon to be seen are listed in your "After Visit Summary", but are typically between 1-4 weeks after surgery.  OTHER INSTRUCTIONS:   Knee Replacement:  Do not place pillow under knee, focus on keeping the knee straight while resting. CPM instructions: 0-90 degrees, 2 hours in the morning, 2 hours in the afternoon, and 2 hours in the evening. Place foam block, curve side up under heel at all times except when in CPM or when walking.  DO NOT modify, tear, cut, or change the foam block in any way.  MAKE SURE YOU:  Understand these instructions.  Get help right away if you are not doing well or get worse.    Thank you for letting us be a part of your medical care team.  It is a privilege we respect greatly.  We hope these instructions will help you stay on track for a fast  and full recovery!     Increase activity  slowly as tolerated    Complete by:  As directed               Signed: DEAN,GREGORY SCOTT 11/17/2014, 8:34 AM

## 2014-11-17 NOTE — Progress Notes (Signed)
Orthopedic Tech Progress Note Patient Details:  [name] 08-Apr-1960 [id number] On cpm at 7:00 pm Patient ID: [name], female   DOB: [birth year], 54 y.o.   MRN: [id number]   [name] 11/17/2014, 7:13 PM

## 2014-11-17 NOTE — Discharge Instructions (Signed)
Information on my medicine - XARELTO (Rivaroxaban)  This medication education was reviewed with me or my healthcare representative as part of my discharge preparation.  The pharmacist that spoke with me during my hospital stay was:  Sharilyn Sites M.- Pharm D. Why was Xarelto prescribed for you? Xarelto was prescribed for you to reduce the risk of blood clots forming after orthopedic surgery. The medical term for these abnormal blood clots is venous thromboembolism (VTE).  What do you need to know about xarelto ? Take your Xarelto ONCE DAILY at the same time every day. You may take it either with or without food.  If you have difficulty swallowing the tablet whole, you may crush it and mix in applesauce just prior to taking your dose.  Take Xarelto exactly as prescribed by your doctor and DO NOT stop taking Xarelto without talking to the doctor who prescribed the medication.  Stopping without other VTE prevention medication to take the place of Xarelto may increase your risk of developing a clot.  After discharge, you should have regular check-up appointments with your healthcare provider that is prescribing your Xarelto.    What do you do if you miss a dose? If you miss a dose, take it as soon as you remember on the same day then continue your regularly scheduled once daily regimen the next day. Do not take two doses of Xarelto on the same day.   Important Safety Information A possible side effect of Xarelto is bleeding. You should call your healthcare provider right away if you experience any of the following: ? Bleeding from an injury or your nose that does not stop. ? Unusual colored urine (red or dark brown) or unusual colored stools (red or black). ? Unusual bruising for unknown reasons. ? A serious fall or if you hit your head (even if there is no bleeding).  Some medicines may interact with Xarelto and might increase your risk of bleeding while on Xarelto. To help avoid  this, consult your healthcare provider or pharmacist prior to using any new prescription or non-prescription medications, including herbals, vitamins, non-steroidal anti-inflammatory drugs (NSAIDs) and supplements.  This website has more information on Xarelto: [email or web address]

## 2014-11-17 NOTE — NC FL2 (Deleted)
[place] LEVEL OF CARE SCREENING TOOL     IDENTIFICATION  Patient Name: [name] Birthdate: 03-Aug-1960 Sex: female Admission Date (Current Location): 11/15/2014  [facility] and [place] Number:     Facility and Address:  [facility] [place], [place] [address] [place], [place] [address]      Provider Number: [id number]  Attending Physician Name and Address:  Meredith Pel, MD  Relative Name and Phone Number:       Current Level of Care: Hospital Recommended Level of Care: [place] Prior Approval Number:    Date Approved/Denied:   PASRR Number:   [id number]  Discharge Plan: SNF    Current Diagnoses: Patient Active Problem List   Diagnosis Date Noted  . Arthritis of knee, degenerative 11/15/2014  . Calf pain 01/21/2014  . Insomnia 12/23/2013  . Leg ulcer, left ([place]) 09/13/2013  . Abnormal SPEP 06/04/2013  . Memory loss 04/27/2013  . Panic attacks 03/17/2013  . Right knee pain 12/29/2012  . Rash and nonspecific skin eruption 10/27/2012  . Dizziness and giddiness 09/07/2012  . Pulmonary nodules 07/20/2012  . CAD in native artery 05/28/2012  . Seborrheic dermatitis 01/20/2012  . Recurrent knee pain 09/03/2011  . Knee pain, left 08/03/2011  . Leg pain, left 07/17/2011  . Polydipsia 05/09/2011  . Diabetes mellitus type 2, uncontrolled ([place]) 05/09/2011  . Abscess of axilla, left 08/10/2010  . Obesity 03/07/2010  . INSOMNIA, CHRONIC 03/07/2010  . NAUSEA 03/07/2010  . BROKEN TOOTH 03/07/2010  . ALLERGIC RHINITIS 10/26/2009  . GRIEF REACTION 08/01/2009  . DYSPNEA 04/28/2009  . MAXILLARY SINUSITIS 10/05/2008  . Essential hypertension 08/27/2007  . B12 deficiency 08/25/2007  . Vitamin D deficiency 08/25/2007  . ELEVATED BP 07/10/2007  . VISUAL CHANGES 07/02/2007  . UTI 02/07/2007  . ABDOMINAL PAIN RIGHT LOWER QUADRANT 02/07/2007  . SINUSITIS, ACUTE 02/02/2007  . Osteoarthritis 02/02/2007  . Sarcoidosis  ([place]) 08/14/2006  . Anxiety state 08/14/2006  . Major depressive disorder, recurrent episode ([place]) 08/14/2006  . GERD 08/14/2006    Orientation ACTIVITIES/SOCIAL BLADDER RESPIRATION    Self, Time, Place, Situation  Active Incontinent O2 (As needed)  BEHAVIORAL SYMPTOMS/MOOD NEUROLOGICAL BOWEL NUTRITION STATUS  Other (Comment) (n/a)  (n/a) Continent  (Please see discharge summary.)  PHYSICIAN VISITS COMMUNICATION OF NEEDS Height & Weight Skin    Verbally 5\' 7"  (170.2 cm) 244 lbs. Surgical wounds          AMBULATORY STATUS RESPIRATION    Assist extensive O2 (As needed)      Personal Care Assistance Level of Assistance  Bathing Bathing Assistance: Maximum assistance          Functional Limitations Info   (n/a)             SPECIAL CARE FACTORS FREQUENCY  PT (By licensed PT), OT (By licensed OT)     PT Frequency: 5 OT Frequency: 5           Additional Factors Info  Code Status, Allergies Code Status Info: FULL Allergies Info: Enalapril Maleate, Hydrochlorothiazide, Hydroxychloroquine Sulfate, Iodine, Latex           Current Medications (11/17/2014): Current Facility-Administered Medications  Medication Dose Route Frequency Provider Last Rate Last Dose  . acetaminophen (TYLENOL) tablet 650 mg  650 mg Oral Q6H PRN Meredith Pel, MD       Or  . acetaminophen (TYLENOL) suppository 650 mg  650 mg Rectal Q6H PRN Meredith Pel, MD      .  carvedilol (COREG) tablet 12.5 mg  12.5 mg Oral BID WC Meredith Pel, MD   12.5 mg at 11/17/14 0748  . cholecalciferol (VITAMIN D) tablet 1,000 Units  1,000 Units Oral Daily Meredith Pel, MD   1,000 Units at 11/17/14 [phone]  . docusate sodium (COLACE) capsule 100 mg  100 mg Oral BID Meredith Pel, MD   100 mg at 11/17/14 0954  . escitalopram (LEXAPRO) tablet 20 mg  20 mg Oral Daily Meredith Pel, MD   20 mg at 11/17/14 0953  . HYDROmorphone (DILAUDID) injection 1 mg  1 mg Intravenous Q3H PRN Meredith Pel, MD      . insulin aspart (novoLOG) injection 0-15 Units  0-15 Units Subcutaneous TID WC Meredith Pel, MD   0 Units at 11/16/14 [phone]  . menthol-cetylpyridinium (CEPACOL) lozenge 3 mg  1 lozenge Oral PRN Meredith Pel, MD       Or  . phenol (CHLORASEPTIC) mouth spray 1 spray  1 spray Mouth/Throat PRN Meredith Pel, MD      . metFORMIN (GLUCOPHAGE-XR) 24 hr tablet 500 mg  500 mg Oral Q breakfast Meredith Pel, MD   500 mg at 11/17/14 0749  . metoCLOPramide (REGLAN) tablet 5-10 mg  5-10 mg Oral Q8H PRN Meredith Pel, MD       Or  . metoCLOPramide (REGLAN) injection 5-10 mg  5-10 mg Intravenous Q8H PRN Meredith Pel, MD      . morphine (MS CONTIN) 12 hr tablet 15 mg  15 mg Oral QHS Meredith Pel, MD   15 mg at 11/16/14 2123  . nortriptyline (PAMELOR) capsule 50 mg  50 mg Oral QHS Meredith Pel, MD   50 mg at 11/16/14 2123  . ondansetron (ZOFRAN) tablet 4 mg  4 mg Oral Q6H PRN Meredith Pel, MD   4 mg at 11/16/14 1107   Or  . ondansetron (ZOFRAN) injection 4 mg  4 mg Intravenous Q6H PRN Meredith Pel, MD      . oxyCODONE (Oxy IR/ROXICODONE) immediate release tablet 30 mg  30 mg Oral Q4H PRN Meredith Pel, MD   30 mg at 11/17/14 0954  . pantoprazole (PROTONIX) EC tablet 40 mg  40 mg Oral QAC breakfast Meredith Pel, MD   40 mg at 11/17/14 0749  . potassium chloride (K-DUR) CR tablet 10 mEq  10 mEq Oral Daily Meredith Pel, MD   10 mEq at 11/17/14 0954  . rivaroxaban (XARELTO) tablet 10 mg  10 mg Oral Q breakfast Meredith Pel, MD   10 mg at 11/17/14 0749  . topiramate (TOPAMAX) tablet 50 mg  50 mg Oral BID Meredith Pel, MD   50 mg at 11/17/14 4854   Do not use this list as official medication orders. Please verify with discharge summary.  Discharge Medications:   Medication List    STOP taking these medications        VOLTAREN 1 % Gel  Generic drug:  diclofenac sodium      TAKE these medications         carvedilol 12.5 MG tablet  Commonly known as:  COREG  TAKE 1 TABLET BY MOUTH TWICE DAILY WITH MEALS     cholecalciferol 1000 UNITS tablet  Commonly known as:  VITAMIN D  Take 1 tablet (1,000 Units total) by mouth daily.     clobetasol 0.05 % external solution  Commonly known as:  TEMOVATE  Apply 1 application topically 2 (two) times daily.     diazepam 10 MG tablet  Commonly known as:  VALIUM  Take 5 mg by mouth daily as needed for anxiety (before procedures).     escitalopram 20 MG tablet  Commonly known as:  LEXAPRO  TAKE 1 TABLET BY MOUTH DAILY     freestyle lancets  1 each by Other route 2 (two) times daily. Use as instructed     FREESTYLE LITE Devi  Use to check blood sugar 2 times per day     glucose blood test strip  Commonly known as:  FREESTYLE LITE  TEST TWICE DAILY     KLOR-CON 10 10 MEQ tablet  Generic drug:  potassium chloride  TAKE 1 TABLET BY MOUTH DAILY     metFORMIN 500 MG 24 hr tablet  Commonly known as:  GLUCOPHAGE-XR  Take 500 mg by mouth daily with breakfast.     morphine 15 MG 12 hr tablet  Commonly known as:  MS CONTIN  Take 1-2 tablets (15-30 mg total) by mouth at bedtime. Please fill on or after 10/09/14     NEXIUM 40 MG capsule  Generic drug:  esomeprazole  TAKE ONE CAPSULE BY MOUTH EVERY DAY BEFORE BREAKFAST     nortriptyline 25 MG capsule  Commonly known as:  PAMELOR  TAKE 1 TO 2 CAPSULES BY MOUTH EVERY NIGHT AT BEDTIME AS NEEDED FOR INSOMNIA     oxycodone 30 MG immediate release tablet  Commonly known as:  ROXICODONE  Take 2 tablets (60 mg total) by mouth every 4 (four) hours as needed for severe pain.     rivaroxaban 10 MG Tabs tablet  Commonly known as:  XARELTO  Take 1 tablet (10 mg total) by mouth daily with breakfast.     topiramate 50 MG tablet  Commonly known as:  TOPAMAX  TAKE 1 TABLET BY MOUTH TWICE DAILY        Relevant Imaging Results:  Relevant Lab Results:  Recent Labs    Additional [place], [place], [place]

## 2014-11-17 NOTE — Progress Notes (Signed)
Physical Therapy Treatment Patient Details Name: [name] MRN: [id number] DOB: [birth year] Today's Date: 11/17/2014    History of Present Illness Pt presents for right TKA with PMH of OA, DM, sarcoidosis, lichen planus, OSA    PT Comments    POD # 2 pt OOB in recliner very sleepy.  Observed nasal cannula laying on floor next to pt.  Sats taken registered 68%.  RN notified.  Reapplied 2 lts to achieve 92%.  Assisted with sit to stand with KI applied however pt was unable to perform with R LE forward and only standing on one leg.  Removed KI and assisted with R knee flexion.  Pt was better able to rise using forward rocking momentum and + 2 side by side assist. Attempted amb however pt was unable to weight shift and unable to take any functional steps.  Noted increased anxiety/fear of falling and requested to sit. Positioned in recliner and performed some TKR TE's of only AP and knee presses.  Applied ICE.  Follow Up Recommendations  SNF     Equipment Recommendations       Recommendations for Other Services       Precautions / Restrictions Precautions Precautions: Fall;Knee Precaution Comments: pt fell day before surgery at home. Reviewed proper positioning Required Braces or Orthoses: Knee Immobilizer - Right Knee Immobilizer - Right: On except when in CPM Restrictions Weight Bearing Restrictions: No RLE Weight Bearing: Weight bearing as tolerated Other Position/Activity Restrictions: fearful, anxiety, procrastination, monitor O2 sats    Mobility  Bed Mobility               General bed mobility comments: Pt OOB in recliner  Transfers Overall transfer level: Needs assistance Equipment used: Rolling walker (2 wheeled) Transfers: Sit to/from Stand Sit to Stand: Max assist;+2 physical assistance         General transfer comment: pt still limited by fear, begins to initiate transfer and then asks to wait multiple times. Max A +2 for power as well as  support during squat transfer.  Had to remove KI for sit to stand as pt was unable when applied.   Ambulation/Gait             General Gait Details: unable to weight shift, unable to take any forward motion steps.  Increased anxiety. Pt stood less than one min before requesting to sit    Stairs            Wheelchair Mobility    Modified Rankin (Stroke Patients Only)       Balance                                    Cognition Arousal/Alertness: Awake/alert Behavior During Therapy: Anxious Overall Cognitive Status: Impaired/Different from baseline                 General Comments: nervous/fearful of falling    Exercises  10 reps AP 10 reps knee presses    General Comments        Pertinent Vitals/Pain Pain Assessment: 0-10 Pain Score: 8  Pain Location: right knee Pain Descriptors / Indicators: Constant;Grimacing;Sore Pain Intervention(s): Monitored during session;Premedicated before session;Repositioned;Ice applied    Home Living                      Prior Function            PT Goals (  current goals can now be found in the care plan section) Progress towards PT goals: Progressing toward goals    Frequency  Min 5X/week    PT Plan Current plan remains appropriate    Co-evaluation             End of Session Equipment Utilized During Treatment: Gait belt;Right knee immobilizer Activity Tolerance: Patient limited by pain (self limiting, fear, anxiety) Patient left: in chair;with call bell/phone within reach     Time: 1110-1140 PT Time Calculation (min) (ACUTE ONLY): 30 min  Charges:  $Gait Training: 8-22 mins $Therapeutic Activity: 8-22 mins                    G Codes:      Rica Koyanagi  PTA [facility]  Acute  Rehab Pager      [phone]

## 2014-11-17 NOTE — Clinical Social Work Note (Signed)
Clinical Social Work Assessment  Patient Details  Name: [name] MRN: [id number] Date of Birth: 1960-06-22  Date of referral:  11/17/14               Reason for consult:  Facility Placement, Discharge Planning                Permission sought to share information with:  Chartered certified accountant granted to share information::  Yes, Verbal Permission Granted  Name::        Agency::  [facility] (prefers [place])  Relationship::     Contact Information:     Housing/Transportation Living arrangements for the past 2 months:  [place] of Information:  Patient Patient Interpreter Needed:  None Criminal Activity/Legal Involvement Pertinent to Current Situation/Hospitalization:  No - Comment as needed Significant Relationships:  None Lives with:  Self Do you feel safe going back to the place where you live?  No (High fall risk.) Need for family participation in patient care:  No (Coment) (Patient able to make own decisions.)  Care giving concerns:  Patient expressed no concerns at this time.   Social Worker assessment / plan:  CSW received referral for possible SNF placement at time of discharge. CSW met with patient to discuss discharge disposition. Per patient, patient understanding of PT recommendation and would prefer to discharge to [facility] once medically stable. CSW to continue to follow and assist with discharge planning needs.  Employment status:  Other (Comment) (Did not disclose.) Insurance information:  Medicare PT Recommendations:  [place] / Referral to community resources:  [place]  Patient/Family's Response to care:  Patient understanding and agreeable to CSW plan of care.  Patient/Family's Understanding of and Emotional Response to Diagnosis, Current Treatment, and Prognosis:  Patient understanding and agreeable to CSW plan of care.  Emotional Assessment Appearance:   Appears stated age Attitude/Demeanor/Rapport:  Other (Pleasant.) Affect (typically observed):  Accepting, Pleasant, Appropriate Orientation:  Oriented to Self, Oriented to Place, Oriented to  Time, Oriented to Situation Alcohol / Substance use:  Not Applicable Psych involvement (Current and /or in the community):  No (Comment) (Not appropriate on this admission.)  Discharge Needs  Concerns to be addressed:  No discharge needs identified Readmission within the last 30 days:  No Current discharge risk:  None Barriers to Discharge:  No Barriers Identified   Caroline Sauger, LCSW 11/17/2014, 11:22 AM [phone]

## 2014-11-17 NOTE — Clinical Social Work Placement (Signed)
   CLINICAL SOCIAL WORK PLACEMENT  NOTE  Date:  11/17/2014  Patient Details  Name: [name] MRN: [id number] Date of Birth: 1960/07/15  Clinical Social Work is seeking post-discharge placement for this patient at the [place] level of care (*CSW will initial, date and re-position this form in  chart as items are completed):  Yes   Patient/family provided with [place] Work Department's list of facilities offering this level of care within the geographic area requested by the patient (or if unable, by the patient's family).  Yes   Patient/family informed of their freedom to choose among providers that offer the needed level of care, that participate in Medicare, Medicaid or managed care program needed by the patient, have an available bed and are willing to accept the patient.  Yes   Patient/family informed of [facility]'s ownership interest in [facility] and [facility], as well as of the fact that they are under no obligation to receive care at these facilities.  PASRR submitted to EDS on 11/17/14     PASRR number received on 11/17/14     Existing PASRR number confirmed on       FL2 transmitted to all facilities in geographic area requested by pt/family on 11/17/14     FL2 transmitted to all facilities within larger geographic area on       Patient informed that his/her managed care company has contracts with or will negotiate with certain facilities, including the following:            Patient/family informed of bed offers received.  Patient chooses bed at       Physician recommends and patient chooses bed at      Patient to be transferred to   on  .  Patient to be transferred to facility by       Patient family notified on   of transfer.  Name of family member notified:        PHYSICIAN Please sign FL2     Additional Comment:    _______________________________________________ Caroline Sauger,  LCSW 11/17/2014, 12:17 PM [phone]

## 2014-11-17 NOTE — Progress Notes (Signed)
Pt voiding small amounts. Bladder scan done immediately after patient voided showed ~557ml urine still retained. Patient denies any bladder discomfort, fullness, or needing to urinate. In and out cath done got out 1000 ml of urine. Will continue to monitor.

## 2014-11-17 NOTE — Care Management Important Message (Signed)
Important Message  Patient Details  Name: ZERAH HILYER MRN: [id number] Date of Birth: 1960/12/20   Medicare Important Message Given:  N/A - LOS <3 / Initial given by admissions    Dawayne Patricia, RN 11/17/2014, 3:42 PM

## 2014-11-18 DIAGNOSIS — Z96651 Presence of right artificial knee joint: Secondary | ICD-10-CM | POA: Diagnosis not present

## 2014-11-18 DIAGNOSIS — R262 Difficulty in walking, not elsewhere classified: Secondary | ICD-10-CM | POA: Diagnosis not present

## 2014-11-18 DIAGNOSIS — M62838 Other muscle spasm: Secondary | ICD-10-CM | POA: Diagnosis not present

## 2014-11-18 DIAGNOSIS — G43809 Other migraine, not intractable, without status migrainosus: Secondary | ICD-10-CM | POA: Diagnosis not present

## 2014-11-18 DIAGNOSIS — E1165 Type 2 diabetes mellitus with hyperglycemia: Secondary | ICD-10-CM | POA: Diagnosis not present

## 2014-11-18 DIAGNOSIS — G8929 Other chronic pain: Secondary | ICD-10-CM | POA: Diagnosis not present

## 2014-11-18 DIAGNOSIS — K219 Gastro-esophageal reflux disease without esophagitis: Secondary | ICD-10-CM | POA: Diagnosis not present

## 2014-11-18 DIAGNOSIS — M1712 Unilateral primary osteoarthritis, left knee: Secondary | ICD-10-CM | POA: Diagnosis not present

## 2014-11-18 DIAGNOSIS — E876 Hypokalemia: Secondary | ICD-10-CM | POA: Diagnosis not present

## 2014-11-18 DIAGNOSIS — I10 Essential (primary) hypertension: Secondary | ICD-10-CM | POA: Diagnosis not present

## 2014-11-18 DIAGNOSIS — F339 Major depressive disorder, recurrent, unspecified: Secondary | ICD-10-CM | POA: Diagnosis not present

## 2014-11-18 DIAGNOSIS — R600 Localized edema: Secondary | ICD-10-CM | POA: Diagnosis not present

## 2014-11-18 DIAGNOSIS — M1711 Unilateral primary osteoarthritis, right knee: Secondary | ICD-10-CM | POA: Diagnosis not present

## 2014-11-18 DIAGNOSIS — M6281 Muscle weakness (generalized): Secondary | ICD-10-CM | POA: Diagnosis not present

## 2014-11-18 DIAGNOSIS — F329 Major depressive disorder, single episode, unspecified: Secondary | ICD-10-CM | POA: Diagnosis not present

## 2014-11-18 DIAGNOSIS — R2681 Unsteadiness on feet: Secondary | ICD-10-CM | POA: Diagnosis not present

## 2014-11-18 DIAGNOSIS — R278 Other lack of coordination: Secondary | ICD-10-CM | POA: Diagnosis not present

## 2014-11-18 DIAGNOSIS — M25562 Pain in left knee: Secondary | ICD-10-CM | POA: Diagnosis not present

## 2014-11-18 DIAGNOSIS — E669 Obesity, unspecified: Secondary | ICD-10-CM | POA: Diagnosis not present

## 2014-11-18 DIAGNOSIS — G4700 Insomnia, unspecified: Secondary | ICD-10-CM | POA: Diagnosis not present

## 2014-11-18 DIAGNOSIS — E119 Type 2 diabetes mellitus without complications: Secondary | ICD-10-CM | POA: Diagnosis not present

## 2014-11-18 DIAGNOSIS — D62 Acute posthemorrhagic anemia: Secondary | ICD-10-CM | POA: Diagnosis not present

## 2014-11-18 DIAGNOSIS — Z471 Aftercare following joint replacement surgery: Secondary | ICD-10-CM | POA: Diagnosis not present

## 2014-11-18 DIAGNOSIS — M25561 Pain in right knee: Secondary | ICD-10-CM | POA: Diagnosis not present

## 2014-11-18 DIAGNOSIS — N393 Stress incontinence (female) (male): Secondary | ICD-10-CM | POA: Diagnosis not present

## 2014-11-18 LAB — GLUCOSE, CAPILLARY
Glucose-Capillary: 81 mg/dL (ref 65–99)
Glucose-Capillary: 126 mg/dL — ABNORMAL HIGH (ref 65–99)

## 2014-11-18 LAB — CBC
HEMATOCRIT: 32.7 % — AB (ref 36.0–46.0)
Hemoglobin: 10.6 g/dL — ABNORMAL LOW (ref 12.0–15.0)
MCH: 31.9 pg (ref 26.0–34.0)
MCHC: 32.4 g/dL (ref 30.0–36.0)
MCV: 98.5 fL (ref 78.0–100.0)
PLATELETS: 178 10*3/uL (ref 150–400)
RBC: 3.32 MIL/uL — AB (ref 3.87–5.11)
RDW: 12.9 % (ref 11.5–15.5)
WBC: 8.4 10*3/uL (ref 4.0–10.5)

## 2014-11-18 MED ORDER — LIDOCAINE HCL 2 % EX GEL
1.0000 "application " | Freq: Once | CUTANEOUS | Status: DC
  Filled 2014-11-18: qty 20

## 2014-11-18 NOTE — Discharge Summary (Signed)
Physician Discharge Summary  Patient ID: [name] MRN: [id number] DOB/AGE: 03-12-1960 54 y.o.  Admit date: 11/15/2014 Discharge date: 11/18/2014  Admission Diagnoses: Right knee osteoarthritis  Discharge Diagnoses:  Active Problems:   Arthritis of knee, degenerative   Discharged Condition: stable  Hospital Course: Patient's hospital course was essentially unremarkable. She underwent total knee arthroplasty. Postoperatively patient progressed slowly was discharged to skilled nursing in stable condition.  Consults: None  Significant Diagnostic Studies: labs: Routine labs  Treatments: surgery: See operative note  Discharge Exam: Blood pressure 126/60, pulse 86, temperature 98.7 F (37.1 C), temperature source Oral, resp. rate 18, SpO2 100 %. Incision/Wound: dressing clean and dry  Disposition: 01-Home or Self Care  Discharge Instructions    Call MD / Call 911    Complete by:  As directed   If you experience chest pain or shortness of breath, CALL 911 and be transported to the hospital emergency room.  If you develope a fever above 101 F, pus (white drainage) or increased drainage or redness at the wound, or calf pain, call your surgeon's office.     Call MD / Call 911    Complete by:  As directed   If you experience chest pain or shortness of breath, CALL 911 and be transported to the hospital emergency room.  If you develope a fever above 101 F, pus (white drainage) or increased drainage or redness at the wound, or calf pain, call your surgeon's office.     Constipation Prevention    Complete by:  As directed   Drink plenty of fluids.  Prune juice may be helpful.  You may use a stool softener, such as Colace (over the counter) 100 mg twice a day.  Use MiraLax (over the counter) for constipation as needed.     Constipation Prevention    Complete by:  As directed   Drink plenty of fluids.  Prune juice may be helpful.  You may use a stool softener, such as Colace  (over the counter) 100 mg twice a day.  Use MiraLax (over the counter) for constipation as needed.     Diet - low sodium heart healthy    Complete by:  As directed      Diet - low sodium heart healthy    Complete by:  As directed      Discharge instructions    Complete by:  As directed   Keep incision dry Weight bearing as tolerated CPM 6 hours per day INSTRUCTIONS AFTER JOINT REPLACEMENT   Remove items at home which could result in a fall. This includes throw rugs or furniture in walking pathways ICE to the affected joint every three hours while awake for 30 minutes at a time, for at least the first 3-5 days, and then as needed for pain and swelling.  Continue to use ice for pain and swelling. You may notice swelling that will progress down to the foot and ankle.  This is normal after surgery.  Elevate your leg when you are not up walking on it.   Continue to use the breathing machine you got in the hospital (incentive spirometer) which will help keep your temperature down.  It is common for your temperature to cycle up and down following surgery, especially at night when you are not up moving around and exerting yourself.  The breathing machine keeps your lungs expanded and your temperature down.   DIET:  As you were doing prior to hospitalization, we recommend a well-balanced diet.  DRESSING / WOUND CARE / SHOWERING  Keep the surgical dressing until follow up.  The dressing is water proof, so you can shower without any extra covering.  IF THE DRESSING FALLS OFF or the wound gets wet inside, change the dressing with sterile gauze.  Please use good hand washing techniques before changing the dressing.  Do not use any lotions or creams on the incision until instructed by your surgeon.    ACTIVITY  Increase activity slowly as tolerated, but follow the weight bearing instructions below.   No driving for 6 weeks or until further direction given by your physician.  You cannot drive while taking  narcotics.  No lifting or carrying greater than 10 lbs. until further directed by your surgeon. Avoid periods of inactivity such as sitting longer than an hour when not asleep. This helps prevent blood clots.  You may return to work once you are authorized by your doctor.     WEIGHT BEARING   Weight bearing as tolerated with assist device (walker, cane, etc) as directed, use it as long as suggested by your surgeon or therapist, typically at least 4-6 weeks.   EXERCISES  Results after joint replacement surgery are often greatly improved when you follow the exercise, range of motion and muscle strengthening exercises prescribed by your doctor. Safety measures are also important to protect the joint from further injury. Any time any of these exercises cause you to have increased pain or swelling, decrease what you are doing until you are comfortable again and then slowly increase them. If you have problems or questions, call your caregiver or physical therapist for advice.   Rehabilitation is important following a joint replacement. After just a few days of immobilization, the muscles of the leg can become weakened and shrink (atrophy).  These exercises are designed to build up the tone and strength of the thigh and leg muscles and to improve motion. Often times heat used for twenty to thirty minutes before working out will loosen up your tissues and help with improving the range of motion but do not use heat for the first two weeks following surgery (sometimes heat can increase post-operative swelling).   These exercises can be done on a training (exercise) mat, on the floor, on a table or on a bed. Use whatever works the best and is most comfortable for you.    Use music or television while you are exercising so that the exercises are a pleasant break in your day. This will make your life better with the exercises acting as a break in your routine that you can look forward to.   Perform all  exercises about fifteen times, three times per day or as directed.  You should exercise both the operative leg and the other leg as well.   Exercises include:   Quad Sets - Tighten up the muscle on the front of the thigh (Quad) and hold for 5-10 seconds.   Straight Leg Raises - With your knee straight (if you were given a brace, keep it on), lift the leg to 60 degrees, hold for 3 seconds, and slowly lower the leg.  Perform this exercise against resistance later as your leg gets stronger.  Leg Slides: Lying on your back, slowly slide your foot toward your buttocks, bending your knee up off the floor (only go as far as is comfortable). Then slowly slide your foot back down until your leg is flat on the floor again.  Angel Wings: Lying on your  back spread your legs to the side as far apart as you can without causing discomfort.  Hamstring Strength:  Lying on your back, push your heel against the floor with your leg straight by tightening up the muscles of your buttocks.  Repeat, but this time bend your knee to a comfortable angle, and push your heel against the floor.  You may put a pillow under the heel to make it more comfortable if necessary.   A rehabilitation program following joint replacement surgery can speed recovery and prevent re-injury in the future due to weakened muscles. Contact your doctor or a physical therapist for more information on knee rehabilitation.    CONSTIPATION  Constipation is defined medically as fewer than three stools per week and severe constipation as less than one stool per week.  Even if you have a regular bowel pattern at home, your normal regimen is likely to be disrupted due to multiple reasons following surgery.  Combination of anesthesia, postoperative narcotics, change in appetite and fluid intake all can affect your bowels.   YOU MUST use at least one of the following options; they are listed in order of increasing strength to get the job done.  They are all  available over the counter, and you may need to use some, POSSIBLY even all of these options:    Drink plenty of fluids (prune juice may be helpful) and high fiber foods Colace 100 mg by mouth twice a day  Senokot for constipation as directed and as needed Dulcolax (bisacodyl), take with full glass of water  Miralax (polyethylene glycol) once or twice a day as needed.  If you have tried all these things and are unable to have a bowel movement in the first 3-4 days after surgery call either your surgeon or your primary doctor.    If you experience loose stools or diarrhea, hold the medications until you stool forms back up.  If your symptoms do not get better within 1 week or if they get worse, check with your doctor.  If you experience "the worst abdominal pain ever" or develop nausea or vomiting, please contact the office immediately for further recommendations for treatment.   ITCHING:  If you experience itching with your medications, try taking only a single pain pill, or even half a pain pill at a time.  You can also use Benadryl over the counter for itching or also to help with sleep.   TED HOSE STOCKINGS:  Use stockings on both legs until for at least 2 weeks or as directed by physician office. They may be removed at night for sleeping.  MEDICATIONS:  See your medication summary on the "After Visit Summary" that nursing will review with you.  You may have some home medications which will be placed on hold until you complete the course of blood thinner medication.  It is important for you to complete the blood thinner medication as prescribed.  PRECAUTIONS:  If you experience chest pain or shortness of breath - call 911 immediately for transfer to the hospital emergency department.   If you develop a fever greater that 101 F, purulent drainage from wound, increased redness or drainage from wound, foul odor from the wound/dressing, or calf pain - CONTACT YOUR SURGEON.  FOLLOW-UP APPOINTMENTS:  If you do not already have a post-op appointment, please call the office for an appointment to be seen by your surgeon.  Guidelines for how soon to be seen are listed in your "After Visit Summary", but are typically between 1-4 weeks after surgery.  OTHER INSTRUCTIONS:   Knee Replacement:  Do not place pillow under knee, focus on keeping the knee straight while resting. CPM instructions: 0-90 degrees, 2 hours in the morning, 2 hours in the afternoon, and 2 hours in the evening. Place foam block, curve side up under heel at all times except when in CPM or when walking.  DO NOT modify, tear, cut, or change the foam block in any way.  MAKE SURE YOU:  Understand these instructions.  Get help right away if you are not doing well or get worse.    Thank you for letting us be a part of your medical care team.  It is a privilege we respect greatly.  We hope these instructions will help you stay on track for a fast and full recovery!     Increase activity slowly as tolerated    Complete by:  As directed      Increase activity slowly as tolerated    Complete by:  As directed             Medication List    STOP taking these medications        VOLTAREN 1 % Gel  Generic drug:  diclofenac sodium      TAKE these medications        carvedilol 12.5 MG tablet  Commonly known as:  COREG  TAKE 1 TABLET BY MOUTH TWICE DAILY WITH MEALS     cholecalciferol 1000 UNITS tablet  Commonly known as:  VITAMIN D  Take 1 tablet (1,000 Units total) by mouth daily.     clobetasol 0.05 % external solution  Commonly known as:  TEMOVATE  Apply 1 application topically 2 (two) times daily.     diazepam 10 MG tablet  Commonly known as:  VALIUM  Take 5 mg by mouth daily as needed for anxiety (before procedures).     escitalopram 20 MG tablet  Commonly known as:  LEXAPRO  TAKE 1 TABLET BY MOUTH DAILY     freestyle lancets  1 each by Other route 2 (two)  times daily. Use as instructed     FREESTYLE LITE Devi  Use to check blood sugar 2 times per day     glucose blood test strip  Commonly known as:  FREESTYLE LITE  TEST TWICE DAILY     KLOR-CON 10 10 MEQ tablet  Generic drug:  potassium chloride  TAKE 1 TABLET BY MOUTH DAILY     metFORMIN 500 MG 24 hr tablet  Commonly known as:  GLUCOPHAGE-XR  Take 500 mg by mouth daily with breakfast.     morphine 15 MG 12 hr tablet  Commonly known as:  MS CONTIN  Take 1-2 tablets (15-30 mg total) by mouth at bedtime. Please fill on or after 10/09/14     NEXIUM 40 MG capsule  Generic drug:  esomeprazole  TAKE ONE CAPSULE BY MOUTH EVERY DAY BEFORE BREAKFAST     nortriptyline 25 MG capsule  Commonly known as:  PAMELOR  TAKE 1 TO 2 CAPSULES BY MOUTH EVERY NIGHT AT BEDTIME AS NEEDED FOR INSOMNIA     oxycodone 30 MG immediate release tablet  Commonly known as:  ROXICODONE  Take 2 tablets (  60 mg total) by mouth every 4 (four) hours as needed for severe pain.     rivaroxaban 10 MG Tabs tablet  Commonly known as:  XARELTO  Take 1 tablet (10 mg total) by mouth daily with breakfast.     topiramate 50 MG tablet  Commonly known as:  TOPAMAX  TAKE 1 TABLET BY MOUTH TWICE DAILY           Follow-up Information    Follow up with Meredith Pel, MD In 1 week.   Specialty:  Orthopedic Surgery   Contact information:   [place] [address] [phone]       Signed: Newt Minion 11/18/2014, 7:04 AM

## 2014-11-18 NOTE — Clinical Social Work Placement (Signed)
   CLINICAL SOCIAL WORK PLACEMENT  NOTE  Date:  11/18/2014  Patient Details  Name: [name] MRN: [id number] Date of Birth: 1960-09-14  Clinical Social Work is seeking post-discharge placement for this patient at the [place] level of care (*CSW will initial, date and re-position this form in  chart as items are completed):  Yes   Patient/family provided with [facility] Work Department's list of facilities offering this level of care within the geographic area requested by the patient (or if unable, by the patient's family).  Yes   Patient/family informed of their freedom to choose among providers that offer the needed level of care, that participate in Medicare, Medicaid or managed care program needed by the patient, have an available bed and are willing to accept the patient.  Yes   Patient/family informed of [facility]'s ownership interest in [facility] and [facility], as well as of the fact that they are under no obligation to receive care at these facilities.  PASRR submitted to EDS on 11/17/14     PASRR number received on 11/17/14     Existing PASRR number confirmed on       FL2 transmitted to all facilities in geographic area requested by pt/family on 11/17/14     FL2 transmitted to all facilities within larger geographic area on       Patient informed that his/her managed care company has contracts with or will negotiate with certain facilities, including the following:        Yes   Patient/family informed of bed offers received.  Patient chooses bed at [facility]     Physician recommends and patient chooses bed at      Patient to be transferred to [facility] on 11/18/14.  Patient to be transferred to facility by [facility]     Patient family notified on 11/18/14 of transfer.  Name of family member notified:  Patient     PHYSICIAN       Additional Comment:     _______________________________________________ Caroline Sauger, LCSW 11/18/2014, 12:13 PM

## 2014-11-18 NOTE — Consult Note (Signed)
Urology Consult  Referring physician: Newt Minion, MD  Reason for referral: Urinary retention  Chief Complaint: Urinary retention  History of Present Illness:  54 you female with a history of right knee osteoarthritis s/p total knee arthroplasty on 11/16/14 developed urinary retention after foley catheter removal. She denies a history of urinary retention in the past. She had no urge to urinate with a PVR of 400 mL. Three unsuccessful attempts were made a foley catheter placement were made with no blood per meatus. She reports having foley catheter placed in the past including yesterday without incident.    She denies any hesitancy, straining, incomplete emptying at baseline.   Past Medical History  Diagnosis Date  . Osteoarthritis   . Sarcoidosis ([place])     Dr. Lolita Patella  . Anxiety   . Vitamin D deficiency     is supposed to take Vit D but can't afford it  . Allergic rhinitis   . ALLERGIC RHINITIS 10/26/2009  . ANXIETY 08/14/2006  . B12 DEFICIENCY 08/25/2007  . VITAMIN D DEFICIENCY 08/25/2007  . Hypertension     takes Coreg daily  . HYPERTENSION 08/27/2007  . DYSPNEA 04/28/2009    with exertion  . Shortness of breath   . Pneumonia     over 30 yrs ago  . Headache     last migraine 2-58yr ago;takes Topamax daily  . Confusion   . Short-term memory loss   . Long-term memory loss   . TIA (transient ischemic attack)   . Joint pain   . Joint swelling   . Rheumatoid arthritis ([place]   . Osteoarthritis   . Lichen planus   . Insomnia     takes Nortriptyline nightly   . Esophageal reflux     takes Nexium daily  . Depression     takes Lexapro daily  . DEPRESSION 08/14/2006  . Urinary urgency   . Nocturia   . Diabetes mellitus without complication ([place]     was on insulin but has been off since Nov 2015 and now only takes Metformin daily  . OSA (obstructive sleep apnea)     doesn't use CPAP;sleep study in epic from 2006  . History of shingles    Past Surgical History  Procedure  Laterality Date  . Arthroscopic knee surgery Right 11-12-04  . Appendectomy    . Endometrial ablation    . Axillary abcess irrigation and debridement  Jul & Aug2012  . Cyst removed from top of buttocks  at age 54 . Carpal tunnel release Left 05/23/2014    Procedure: CARPAL TUNNEL RELEASE;  Surgeon: SMeredith Pel MD;  [facility]  Service: Orthopedics;  Laterality: Left;  . Total knee arthroplasty Right 11/15/2014    Procedure: TOTAL RIGHT KNEE ARTHROPLASTY;  Surgeon: SMeredith Pel MD;  [facility]  Service: Orthopedics;  Laterality: Right;    Medications: I have reviewed the patient's current medications. Allergies:  Allergies  Allergen Reactions  . Enalapril Maleate     REACTION: cough  . Hydrochlorothiazide     REACTION: Low potassium  . Hydroxychloroquine Sulfate     REACTION: vision changes  . Iodine     Mother was intolerant--CODED on CT table---pt never tired.  . Latex     Hives    Family History  Problem Relation Age of Onset  . Hypertension    . Coronary artery disease      female 1st degree relative <60  . Heart failure  congestive  . Coronary artery disease      Female 1st degree relative <50  . Breast cancer      1st degree relative <50 S  . Heart disease Mother   . Kidney disease Mother   . Cancer Father     leukemia   Social History:  reports that she has quit smoking. Her smoking use included Cigarettes. She smoked 0.00 packs per day. She does not have any smokeless tobacco history on file. She reports that she does not drink alcohol or use illicit drugs.  ROS - a 12 system review was performed and was negative except for the pertinent positives listed in the HPI.  Physical Exam:  Vital signs in last 24 hours: Temp:  [97.8 F (36.6 C)-99.3 F (37.4 C)] 98.7 F (37.1 C) (11/04 0547) Pulse Rate:  [79-87] 86 (11/04 0547) Resp:  [16-18] 18 (11/04 0547) BP: (72-126)/(46-60) 126/60 mmHg (11/04 0547) SpO2:  [91 %-100 %] 100 %  (11/04 0547) Physical Exam  Laboratory Data:  Results for orders placed or performed during the hospital encounter of 11/15/14 (from the past 72 hour(s))  Glucose, capillary     Status: None   Collection Time: 11/15/14  2:32 PM  Result Value Ref Range   Glucose-Capillary 79 65 - 99 mg/dL  Glucose, capillary     Status: Abnormal   Collection Time: 11/15/14 10:03 PM  Result Value Ref Range   Glucose-Capillary 127 (H) 65 - 99 mg/dL  CBC     Status: None   Collection Time: 11/16/14  6:45 AM  Result Value Ref Range   WBC 9.9 4.0 - 10.5 K/uL   RBC 3.95 3.87 - 5.11 MIL/uL   Hemoglobin 12.5 12.0 - 15.0 g/dL   HCT 38.4 36.0 - 46.0 %   MCV 97.2 78.0 - 100.0 fL   MCH 31.6 26.0 - 34.0 pg   MCHC 32.6 30.0 - 36.0 g/dL   RDW 12.7 11.5 - 15.5 %   Platelets 199 150 - 400 K/uL  Basic metabolic panel     Status: Abnormal   Collection Time: 11/16/14  6:45 AM  Result Value Ref Range   Sodium 135 135 - 145 mmol/L   Potassium 4.1 3.5 - 5.1 mmol/L   Chloride 104 101 - 111 mmol/L   CO2 26 22 - 32 mmol/L   Glucose, Bld 147 (H) 65 - 99 mg/dL   BUN 11 6 - 20 mg/dL   Creatinine, Ser 1.03 (H) 0.44 - 1.00 mg/dL   Calcium 8.8 (L) 8.9 - 10.3 mg/dL   GFR calc non Af Amer >60 >60 mL/min   GFR calc Af Amer >60 >60 mL/min    Comment: (NOTE) The eGFR has been calculated using the CKD EPI equation. This calculation has not been validated in all clinical situations. eGFR's persistently <60 mL/min signify possible Chronic Kidney Disease.    Anion gap 5 5 - 15  Glucose, capillary     Status: Abnormal   Collection Time: 11/16/14  6:53 AM  Result Value Ref Range   Glucose-Capillary 121 (H) 65 - 99 mg/dL  Glucose, capillary     Status: Abnormal   Collection Time: 11/16/14 11:06 AM  Result Value Ref Range   Glucose-Capillary 130 (H) 65 - 99 mg/dL  Glucose, capillary     Status: Abnormal   Collection Time: 11/16/14  4:35 PM  Result Value Ref Range   Glucose-Capillary 121 (H) 65 - 99 mg/dL  Glucose,  capillary  Status: Abnormal   Collection Time: 11/16/14  9:56 PM  Result Value Ref Range   Glucose-Capillary 126 (H) 65 - 99 mg/dL  CBC     Status: Abnormal   Collection Time: 11/17/14  6:12 AM  Result Value Ref Range   WBC 9.2 4.0 - 10.5 K/uL   RBC 3.46 (L) 3.87 - 5.11 MIL/uL   Hemoglobin 11.1 (L) 12.0 - 15.0 g/dL   HCT 34.0 (L) 36.0 - 46.0 %   MCV 98.3 78.0 - 100.0 fL   MCH 32.1 26.0 - 34.0 pg   MCHC 32.6 30.0 - 36.0 g/dL   RDW 12.9 11.5 - 15.5 %   Platelets 166 150 - 400 K/uL  Glucose, capillary     Status: Abnormal   Collection Time: 11/17/14  6:25 AM  Result Value Ref Range   Glucose-Capillary 107 (H) 65 - 99 mg/dL  Glucose, capillary     Status: Abnormal   Collection Time: 11/17/14 11:38 AM  Result Value Ref Range   Glucose-Capillary 128 (H) 65 - 99 mg/dL  Glucose, capillary     Status: Abnormal   Collection Time: 11/17/14  4:45 PM  Result Value Ref Range   Glucose-Capillary 103 (H) 65 - 99 mg/dL  Glucose, capillary     Status: None   Collection Time: 11/17/14 10:17 PM  Result Value Ref Range   Glucose-Capillary 97 65 - 99 mg/dL  CBC     Status: Abnormal   Collection Time: 11/18/14  3:54 AM  Result Value Ref Range   WBC 8.4 4.0 - 10.5 K/uL   RBC 3.32 (L) 3.87 - 5.11 MIL/uL   Hemoglobin 10.6 (L) 12.0 - 15.0 g/dL   HCT 32.7 (L) 36.0 - 46.0 %   MCV 98.5 78.0 - 100.0 fL   MCH 31.9 26.0 - 34.0 pg   MCHC 32.4 30.0 - 36.0 g/dL   RDW 12.9 11.5 - 15.5 %   Platelets 178 150 - 400 K/uL  Glucose, capillary     Status: None   Collection Time: 11/18/14  6:49 AM  Result Value Ref Range   Glucose-Capillary 81 65 - 99 mg/dL   No results found for this or any previous visit (from the past 240 hour(s)). Creatinine:  Recent Labs  11/16/14 0645  CREATININE 1.03*    Impression/Assessment:  54 yo female with urinary retention of 400 mL following an orthopedic procedure. Foley placed without difficulty with return of clear, yellow urine.  No history of retention in past.  Risk factors for retention include recent anesthesia, narcotics and immobilization.  Plan:  1. Recommend trial of void in 3-5 days and when she is ambulatory at SNF (or if discharged from SNF this can be performed a [facility]) 2. Discharge on bactrim or keflex 1 tab QHS while foley in place 3. Patient can follow up as needed with urology in the future for recurrent urinary retention.  Plan was discussed with Dr. Junious Silk who is in agreement with this plan.  Acie Fredrickson 11/18/2014, 12:05 PM

## 2014-11-18 NOTE — Progress Notes (Signed)
Patient discharged to [facility] via [place]. Pain medication given before discharge. Will call report to [facility].

## 2014-11-18 NOTE — Progress Notes (Signed)
Report called toTonya, at [facility].

## 2014-11-18 NOTE — NC FL2 (Signed)
[place] LEVEL OF CARE SCREENING TOOL     IDENTIFICATION  Patient Name: [name] Birthdate: [birth year] Sex: female Admission Date (Current Location): 11/15/2014  [facility] and [place] Number:     Facility and Address:  [facility] [place], [place] [address] [place], [place] [address]      Provider Number: [id number]  Attending Physician Name and Address:  Meredith Pel, MD  Relative Name and Phone Number:       Current Level of Care: Hospital Recommended Level of Care: [place] Prior Approval Number:    Date Approved/Denied:   PASRR Number:    Discharge Plan: SNF    Current Diagnoses: Patient Active Problem List   Diagnosis Date Noted  . Arthritis of knee, degenerative 11/15/2014  . Calf pain 01/21/2014  . Insomnia 12/23/2013  . Leg ulcer, left ([place]) 09/13/2013  . Abnormal SPEP 06/04/2013  . Memory loss 04/27/2013  . Panic attacks 03/17/2013  . Right knee pain 12/29/2012  . Rash and nonspecific skin eruption 10/27/2012  . Dizziness and giddiness 09/07/2012  . Pulmonary nodules 07/20/2012  . CAD in native artery 05/28/2012  . Seborrheic dermatitis 01/20/2012  . Recurrent knee pain 09/03/2011  . Knee pain, left 08/03/2011  . Leg pain, left 07/17/2011  . Polydipsia 05/09/2011  . Diabetes mellitus type 2, uncontrolled ([place]) 05/09/2011  . Abscess of axilla, left 08/10/2010  . Obesity 03/07/2010  . INSOMNIA, CHRONIC 03/07/2010  . NAUSEA 03/07/2010  . BROKEN TOOTH 03/07/2010  . ALLERGIC RHINITIS 10/26/2009  . GRIEF REACTION 08/01/2009  . DYSPNEA 04/28/2009  . MAXILLARY SINUSITIS 10/05/2008  . Essential hypertension 08/27/2007  . B12 deficiency 08/25/2007  . Vitamin D deficiency 08/25/2007  . ELEVATED BP 07/10/2007  . VISUAL CHANGES 07/02/2007  . UTI 02/07/2007  . ABDOMINAL PAIN RIGHT LOWER QUADRANT 02/07/2007  . SINUSITIS, ACUTE 02/02/2007  . Osteoarthritis 02/02/2007  . Sarcoidosis ([place])  08/14/2006  . Anxiety state 08/14/2006  . Major depressive disorder, recurrent episode ([place]) 08/14/2006  . GERD 08/14/2006    Orientation ACTIVITIES/SOCIAL BLADDER RESPIRATION    Self, Time, Place, Situation  Active Incontinent O2 (As needed)  BEHAVIORAL SYMPTOMS/MOOD NEUROLOGICAL BOWEL NUTRITION STATUS  Other (Comment) (n/a)  (n/a) Continent  (Please see discharge summary.)  PHYSICIAN VISITS COMMUNICATION OF NEEDS Height & Weight Skin    Verbally 5\' 7"  (170.2 cm) 244 lbs. Surgical wounds          AMBULATORY STATUS RESPIRATION    Assist extensive O2 (As needed)      Personal Care Assistance Level of Assistance  Bathing Bathing Assistance: Maximum assistance          Functional Limitations Info   (n/a)             SPECIAL CARE FACTORS FREQUENCY  PT (By licensed PT), OT (By licensed OT)     PT Frequency: 5 OT Frequency: 5           Additional Factors Info  Code Status, Allergies Code Status Info: FULL Allergies Info: Enalapril Maleate, Hydrochlorothiazide, Hydroxychloroquine Sulfate, Iodine, Latex           Current Medications (11/18/2014): Current Facility-Administered Medications  Medication Dose Route Frequency Provider Last Rate Last Dose  . acetaminophen (TYLENOL) tablet 650 mg  650 mg Oral Q6H PRN Meredith Pel, MD       Or  . acetaminophen (TYLENOL) suppository 650 mg  650 mg Rectal Q6H PRN Meredith Pel, MD      .  carvedilol (COREG) tablet 12.5 mg  12.5 mg Oral BID WC Meredith Pel, MD   12.5 mg at 11/18/14 0816  . cholecalciferol (VITAMIN D) tablet 1,000 Units  1,000 Units Oral Daily Meredith Pel, MD   1,000 Units at 11/18/14 1055  . docusate sodium (COLACE) capsule 100 mg  100 mg Oral BID Meredith Pel, MD   100 mg at 11/18/14 1055  . escitalopram (LEXAPRO) tablet 20 mg  20 mg Oral Daily Meredith Pel, MD   20 mg at 11/18/14 1055  . HYDROmorphone (DILAUDID) injection 1 mg  1 mg Intravenous Q3H PRN Meredith Pel, MD      . insulin aspart (novoLOG) injection 0-15 Units  0-15 Units Subcutaneous TID WC Meredith Pel, MD   2 Units at 11/17/14 1235  . lidocaine (XYLOCAINE) 2 % jelly 1 application  1 application Urethral Once Newt Minion, MD   1 application at 01/22/30 0800  . menthol-cetylpyridinium (CEPACOL) lozenge 3 mg  1 lozenge Oral PRN Meredith Pel, MD       Or  . phenol (CHLORASEPTIC) mouth spray 1 spray  1 spray Mouth/Throat PRN Meredith Pel, MD      . metFORMIN (GLUCOPHAGE-XR) 24 hr tablet 500 mg  500 mg Oral Q breakfast Meredith Pel, MD   500 mg at 11/18/14 0816  . metoCLOPramide (REGLAN) tablet 5-10 mg  5-10 mg Oral Q8H PRN Meredith Pel, MD       Or  . metoCLOPramide (REGLAN) injection 5-10 mg  5-10 mg Intravenous Q8H PRN Meredith Pel, MD      . morphine (MS CONTIN) 12 hr tablet 15 mg  15 mg Oral QHS Meredith Pel, MD   15 mg at 11/16/14 2123  . nortriptyline (PAMELOR) capsule 50 mg  50 mg Oral QHS Meredith Pel, MD   50 mg at 11/17/14 2040  . ondansetron (ZOFRAN) tablet 4 mg  4 mg Oral Q6H PRN Meredith Pel, MD   4 mg at 11/16/14 1107   Or  . ondansetron (ZOFRAN) injection 4 mg  4 mg Intravenous Q6H PRN Meredith Pel, MD      . oxyCODONE (Oxy IR/ROXICODONE) immediate release tablet 30 mg  30 mg Oral Q8H PRN Meredith Pel, MD   30 mg at 11/17/14 2041  . pantoprazole (PROTONIX) EC tablet 40 mg  40 mg Oral QAC breakfast Meredith Pel, MD   40 mg at 11/18/14 0816  . potassium chloride (K-DUR) CR tablet 10 mEq  10 mEq Oral Daily Meredith Pel, MD   10 mEq at 11/18/14 1055  . rivaroxaban (XARELTO) tablet 10 mg  10 mg Oral Q breakfast Meredith Pel, MD   10 mg at 11/18/14 0816  . topiramate (TOPAMAX) tablet 50 mg  50 mg Oral BID Meredith Pel, MD   50 mg at 11/18/14 1055   Do not use this list as official medication orders. Please verify with discharge summary.  Discharge Medications:   Medication List    STOP  taking these medications        VOLTAREN 1 % Gel  Generic drug:  diclofenac sodium      TAKE these medications        carvedilol 12.5 MG tablet  Commonly known as:  COREG  TAKE 1 TABLET BY MOUTH TWICE DAILY WITH MEALS     cholecalciferol 1000 UNITS tablet  Commonly known as:  VITAMIN D  Take 1 tablet (1,000 Units total) by mouth daily.     clobetasol 0.05 % external solution  Commonly known as:  TEMOVATE  Apply 1 application topically 2 (two) times daily.     diazepam 10 MG tablet  Commonly known as:  VALIUM  Take 5 mg by mouth daily as needed for anxiety (before procedures).     escitalopram 20 MG tablet  Commonly known as:  LEXAPRO  TAKE 1 TABLET BY MOUTH DAILY     freestyle lancets  1 each by Other route 2 (two) times daily. Use as instructed     FREESTYLE LITE Devi  Use to check blood sugar 2 times per day     glucose blood test strip  Commonly known as:  FREESTYLE LITE  TEST TWICE DAILY     KLOR-CON 10 10 MEQ tablet  Generic drug:  potassium chloride  TAKE 1 TABLET BY MOUTH DAILY     metFORMIN 500 MG 24 hr tablet  Commonly known as:  GLUCOPHAGE-XR  Take 500 mg by mouth daily with breakfast.     morphine 15 MG 12 hr tablet  Commonly known as:  MS CONTIN  Take 1-2 tablets (15-30 mg total) by mouth at bedtime. Please fill on or after 10/09/14     NEXIUM 40 MG capsule  Generic drug:  esomeprazole  TAKE ONE CAPSULE BY MOUTH EVERY DAY BEFORE BREAKFAST     nortriptyline 25 MG capsule  Commonly known as:  PAMELOR  TAKE 1 TO 2 CAPSULES BY MOUTH EVERY NIGHT AT BEDTIME AS NEEDED FOR INSOMNIA     oxycodone 30 MG immediate release tablet  Commonly known as:  ROXICODONE  Take 2 tablets (60 mg total) by mouth every 4 (four) hours as needed for severe pain.     rivaroxaban 10 MG Tabs tablet  Commonly known as:  XARELTO  Take 1 tablet (10 mg total) by mouth daily with breakfast.     topiramate 50 MG tablet  Commonly known as:  TOPAMAX  TAKE 1 TABLET BY MOUTH  TWICE DAILY        Relevant Imaging Results:  Relevant Lab Results:  Recent Labs    Additional Information    Caroline Sauger, LCSW

## 2014-11-18 NOTE — Progress Notes (Signed)
Patient unable to void during the night. Three unsuccessful attempts at I & O cath. Foley placed by urologist. Patient voided a total of 750 cc of urine before discharge to facility.

## 2014-11-18 NOTE — Progress Notes (Signed)
PT Cancellation Note  Patient Details Name: [name] MRN: [id number] DOB: 02-21-1960   Cancelled Treatment:    Reason Eval/Treat Not Completed: Other (comment). Talked with nursing, patient about to leave for SNF.    Cassell Clement, PT, CSCS Pager [phone] Office [phone]  11/18/2014, 2:51 PM

## 2014-11-18 NOTE — Procedures (Signed)
Foley Catheter Placement Note  Indications: Urinary retention  Pre-operative Diagnosis: Urinary retention  Post-operative Diagnosis: Same  Attending: Festus Aloe, MD  Resident: Gracy Racer, MD   Assistants: None  Procedure Details  Patient was placed in the supine position, prepped with Betadine and draped in the usual sterile fashion.  We used exaggerated lithotomy with her mobile left leg and spreading of her labia minor.  We then inserted a 16 French straight foley catheter per urethra which easily passed into the bladder without any resistance. She did have some vaginal atrophy and adhesions.  We achieved return of clear yellow urine and then proceeded to insert 10 mL of sterile water into the Foley balloon.  The catheter was attached to a drainage bag and secured with a StatLock.  Placement of the catheter had return of greater than 400 mL of clear yellow urine.               Complications: None; patient tolerated the procedure well.       Attending Attestation: Dr. Junious Silk was available.

## 2014-11-18 NOTE — Discharge Planning (Signed)
Patient to be discharged to [facility].  Facility: [facility] RN report number: [id number] Transportation: EMS  Lubertha Sayres, [place] [phone]) and Surgical [phone])

## 2014-11-21 ENCOUNTER — Telehealth: Payer: Self-pay | Admitting: Internal Medicine

## 2014-11-21 ENCOUNTER — Non-Acute Institutional Stay (SKILLED_NURSING_FACILITY): Payer: Medicare Other | Admitting: Adult Health

## 2014-11-21 DIAGNOSIS — K219 Gastro-esophageal reflux disease without esophagitis: Secondary | ICD-10-CM

## 2014-11-21 DIAGNOSIS — R262 Difficulty in walking, not elsewhere classified: Secondary | ICD-10-CM | POA: Diagnosis not present

## 2014-11-21 DIAGNOSIS — M1711 Unilateral primary osteoarthritis, right knee: Secondary | ICD-10-CM | POA: Diagnosis not present

## 2014-11-21 DIAGNOSIS — E876 Hypokalemia: Secondary | ICD-10-CM

## 2014-11-21 DIAGNOSIS — I10 Essential (primary) hypertension: Secondary | ICD-10-CM

## 2014-11-21 DIAGNOSIS — G43809 Other migraine, not intractable, without status migrainosus: Secondary | ICD-10-CM | POA: Diagnosis not present

## 2014-11-21 DIAGNOSIS — G4700 Insomnia, unspecified: Secondary | ICD-10-CM

## 2014-11-21 DIAGNOSIS — F329 Major depressive disorder, single episode, unspecified: Secondary | ICD-10-CM

## 2014-11-21 DIAGNOSIS — M25562 Pain in left knee: Secondary | ICD-10-CM | POA: Diagnosis not present

## 2014-11-21 DIAGNOSIS — M1712 Unilateral primary osteoarthritis, left knee: Secondary | ICD-10-CM | POA: Diagnosis not present

## 2014-11-21 DIAGNOSIS — N393 Stress incontinence (female) (male): Secondary | ICD-10-CM | POA: Diagnosis not present

## 2014-11-21 DIAGNOSIS — Z96651 Presence of right artificial knee joint: Secondary | ICD-10-CM | POA: Diagnosis not present

## 2014-11-21 DIAGNOSIS — IMO0001 Reserved for inherently not codable concepts without codable children: Secondary | ICD-10-CM

## 2014-11-21 DIAGNOSIS — E1165 Type 2 diabetes mellitus with hyperglycemia: Secondary | ICD-10-CM | POA: Diagnosis not present

## 2014-11-21 DIAGNOSIS — M25561 Pain in right knee: Secondary | ICD-10-CM | POA: Diagnosis not present

## 2014-11-21 DIAGNOSIS — F32A Depression, unspecified: Secondary | ICD-10-CM

## 2014-11-21 DIAGNOSIS — G8929 Other chronic pain: Secondary | ICD-10-CM | POA: Diagnosis not present

## 2014-11-21 DIAGNOSIS — R600 Localized edema: Secondary | ICD-10-CM | POA: Diagnosis not present

## 2014-11-21 DIAGNOSIS — E669 Obesity, unspecified: Secondary | ICD-10-CM | POA: Diagnosis not present

## 2014-11-21 NOTE — Telephone Encounter (Signed)
Pt called and wanted to let you know that the breast biopsy came back benign  Also, she was able to get the complete right knee replacement and she is at [facility].  You can call her if you want.

## 2014-11-22 NOTE — Telephone Encounter (Signed)
Good.  Thx.

## 2014-11-23 DIAGNOSIS — Z96651 Presence of right artificial knee joint: Secondary | ICD-10-CM | POA: Diagnosis not present

## 2014-11-23 DIAGNOSIS — M25562 Pain in left knee: Secondary | ICD-10-CM | POA: Diagnosis not present

## 2014-11-23 DIAGNOSIS — R600 Localized edema: Secondary | ICD-10-CM | POA: Diagnosis not present

## 2014-11-23 DIAGNOSIS — M25561 Pain in right knee: Secondary | ICD-10-CM | POA: Diagnosis not present

## 2014-11-23 DIAGNOSIS — G8929 Other chronic pain: Secondary | ICD-10-CM | POA: Diagnosis not present

## 2014-11-23 DIAGNOSIS — M1712 Unilateral primary osteoarthritis, left knee: Secondary | ICD-10-CM | POA: Diagnosis not present

## 2014-11-23 DIAGNOSIS — E669 Obesity, unspecified: Secondary | ICD-10-CM | POA: Diagnosis not present

## 2014-11-23 DIAGNOSIS — N393 Stress incontinence (female) (male): Secondary | ICD-10-CM | POA: Diagnosis not present

## 2014-11-23 DIAGNOSIS — R262 Difficulty in walking, not elsewhere classified: Secondary | ICD-10-CM | POA: Diagnosis not present

## 2014-11-24 DIAGNOSIS — E669 Obesity, unspecified: Secondary | ICD-10-CM | POA: Diagnosis not present

## 2014-11-24 DIAGNOSIS — M25562 Pain in left knee: Secondary | ICD-10-CM | POA: Diagnosis not present

## 2014-11-24 DIAGNOSIS — M1712 Unilateral primary osteoarthritis, left knee: Secondary | ICD-10-CM | POA: Diagnosis not present

## 2014-11-24 DIAGNOSIS — R262 Difficulty in walking, not elsewhere classified: Secondary | ICD-10-CM | POA: Diagnosis not present

## 2014-11-24 DIAGNOSIS — R600 Localized edema: Secondary | ICD-10-CM | POA: Diagnosis not present

## 2014-11-24 DIAGNOSIS — M25561 Pain in right knee: Secondary | ICD-10-CM | POA: Diagnosis not present

## 2014-11-24 DIAGNOSIS — Z96651 Presence of right artificial knee joint: Secondary | ICD-10-CM | POA: Diagnosis not present

## 2014-11-24 DIAGNOSIS — G8929 Other chronic pain: Secondary | ICD-10-CM | POA: Diagnosis not present

## 2014-11-24 DIAGNOSIS — N393 Stress incontinence (female) (male): Secondary | ICD-10-CM | POA: Diagnosis not present

## 2014-11-25 ENCOUNTER — Non-Acute Institutional Stay (SKILLED_NURSING_FACILITY): Payer: Medicare Other | Admitting: Internal Medicine

## 2014-11-25 ENCOUNTER — Encounter: Payer: Self-pay | Admitting: Internal Medicine

## 2014-11-25 DIAGNOSIS — E119 Type 2 diabetes mellitus without complications: Secondary | ICD-10-CM | POA: Diagnosis not present

## 2014-11-25 DIAGNOSIS — I10 Essential (primary) hypertension: Secondary | ICD-10-CM

## 2014-11-25 DIAGNOSIS — M1711 Unilateral primary osteoarthritis, right knee: Secondary | ICD-10-CM | POA: Diagnosis not present

## 2014-11-25 DIAGNOSIS — F339 Major depressive disorder, recurrent, unspecified: Secondary | ICD-10-CM | POA: Diagnosis not present

## 2014-11-25 DIAGNOSIS — M62838 Other muscle spasm: Secondary | ICD-10-CM

## 2014-11-25 DIAGNOSIS — K219 Gastro-esophageal reflux disease without esophagitis: Secondary | ICD-10-CM | POA: Diagnosis not present

## 2014-11-25 DIAGNOSIS — D62 Acute posthemorrhagic anemia: Secondary | ICD-10-CM

## 2014-11-25 DIAGNOSIS — R2681 Unsteadiness on feet: Secondary | ICD-10-CM

## 2014-11-25 NOTE — Progress Notes (Signed)
Patient ID: [name], female   DOB: 1960-09-09, 54 y.o.   MRN: [id number]     [place]  PCP: Walker Kehr, MD  Code Status: Full Code   Allergies  Allergen Reactions  . Enalapril Maleate     REACTION: cough  . Hydrochlorothiazide     REACTION: Low potassium  . Hydroxychloroquine Sulfate     REACTION: vision changes  . Iodine     Mother was intolerant--CODED on CT table---pt never tired.  . Latex     Hives    Chief Complaint  Patient presents with  . New Admit To SNF    New Admission     HPI:  54 y.o. patient is here for short term rehabilitation post hospital admission from 11/15/14-11/18/14 with right knee OA. She underwent right total knee arthroplasty. She had foley catheter in place during surgery which was removed last night at the facility. She has been voiding well. Her pain is under control with current regimen. She complaints of muscle spasm. No other concerns.  Review of Systems:  Constitutional: Negative for fever, chills, diaphoresis.  HENT: Negative for headache, congestion, nasal discharge. Eyes: Negative for eye pain, blurred vision, double vision and discharge.  Respiratory: Negative for cough, shortness of breath and wheezing.   Cardiovascular: Negative for chest pain, palpitations, leg swelling.  Gastrointestinal: Negative for heartburn, nausea, vomiting, abdominal pain. Bowel movement daily Genitourinary: Negative for dysuria Musculoskeletal: Negative for back pain, falls Skin: Negative for itching, rash.  Neurological: Negative for dizziness Psychiatric/Behavioral: Negative for depression   Past Medical History  Diagnosis Date  . Osteoarthritis   . Sarcoidosis ([place])     Dr. Lolita Patella  . Anxiety   . Vitamin D deficiency     is supposed to take Vit D but can't afford it  . Allergic rhinitis   . ALLERGIC RHINITIS 10/26/2009  . ANXIETY 08/14/2006  . B12 DEFICIENCY 08/25/2007  . VITAMIN D DEFICIENCY 08/25/2007  .  Hypertension     takes Coreg daily  . HYPERTENSION 08/27/2007  . DYSPNEA 04/28/2009    with exertion  . Shortness of breath   . Pneumonia     over 30 yrs ago  . Headache     last migraine 2-73yrs ago;takes Topamax daily  . Confusion   . Short-term memory loss   . Long-term memory loss   . TIA (transient ischemic attack)   . Joint pain   . Joint swelling   . Rheumatoid arthritis ([place])   . Osteoarthritis   . Lichen planus   . Insomnia     takes Nortriptyline nightly   . Esophageal reflux     takes Nexium daily  . Depression     takes Lexapro daily  . DEPRESSION 08/14/2006  . Urinary urgency   . Nocturia   . Diabetes mellitus without complication ([place])     was on insulin but has been off since Nov 2015 and now only takes Metformin daily  . OSA (obstructive sleep apnea)     doesn't use CPAP;sleep study in epic from 2006  . History of shingles    Past Surgical History  Procedure Laterality Date  . Arthroscopic knee surgery Right 11-12-04  . Appendectomy    . Endometrial ablation    . Axillary abcess irrigation and debridement  Jul & Aug2012  . Cyst removed from top of buttocks  at age 24  . Carpal tunnel release Left 05/23/2014    Procedure: CARPAL TUNNEL RELEASE;  Surgeon: Meredith Pel, MD;  Location: [facility];  Service: Orthopedics;  Laterality: Left;  . Total knee arthroplasty Right 11/15/2014    Procedure: TOTAL RIGHT KNEE ARTHROPLASTY;  Surgeon: Meredith Pel, MD;  Location: [place];  Service: Orthopedics;  Laterality: Right;   Social History:   reports that she has quit smoking. Her smoking use included Cigarettes. She smoked 0.00 packs per day. She does not have any smokeless tobacco history on file. She reports that she does not drink alcohol or use illicit drugs.  Family History  Problem Relation Age of Onset  . Hypertension    . Coronary artery disease      female 1st degree relative <60  . Heart failure      congestive  . Coronary artery disease      Female  1st degree relative <50  . Breast cancer      1st degree relative <50 S  . Heart disease Mother   . Kidney disease Mother   . Cancer Father     leukemia    Medications:   Medication List       This list is accurate as of: 11/25/14  9:51 AM.  Always use your most recent med list.               carvedilol 12.5 MG tablet  Commonly known as:  COREG  TAKE 1 TABLET BY MOUTH TWICE DAILY WITH MEALS     cholecalciferol 1000 UNITS tablet  Commonly known as:  VITAMIN D  Take 1 tablet (1,000 Units total) by mouth daily.     clobetasol 0.05 % external solution  Commonly known as:  TEMOVATE  Apply 1 application topically 2 (two) times daily.     diazepam 10 MG tablet  Commonly known as:  VALIUM  Take 5 mg by mouth daily as needed for anxiety (before procedures).     escitalopram 20 MG tablet  Commonly known as:  LEXAPRO  TAKE 1 TABLET BY MOUTH DAILY     freestyle lancets  1 each by Other route 2 (two) times daily. Use as instructed     FREESTYLE LITE Devi  Use to check blood sugar 2 times per day     glucose blood test strip  Commonly known as:  FREESTYLE LITE  TEST TWICE DAILY     KLOR-CON 10 10 MEQ tablet  Generic drug:  potassium chloride  TAKE 1 TABLET BY MOUTH DAILY     metFORMIN 500 MG 24 hr tablet  Commonly known as:  GLUCOPHAGE-XR  Take 500 mg by mouth daily with breakfast.     methocarbamol 500 MG tablet  Commonly known as:  ROBAXIN  Take 500 mg by mouth at bedtime as needed for muscle spasms.     NEXIUM 40 MG capsule  Generic drug:  esomeprazole  TAKE ONE CAPSULE BY MOUTH EVERY DAY BEFORE BREAKFAST     nortriptyline 25 MG capsule  Commonly known as:  PAMELOR  TAKE 1 TO 2 CAPSULES BY MOUTH EVERY NIGHT AT BEDTIME AS NEEDED FOR INSOMNIA     oxyCODONE 15 MG immediate release tablet  Commonly known as:  ROXICODONE  Take 15 mg by mouth every 4 (four) hours as needed for pain (Hold for sedation).     rivaroxaban 10 MG Tabs tablet  Commonly known as:   XARELTO  Take 1 tablet (10 mg total) by mouth daily with breakfast.     topiramate 50 MG tablet  Commonly known as:  TOPAMAX  TAKE 1 TABLET BY MOUTH TWICE DAILY         Physical Exam: Filed Vitals:   11/25/14 0939  BP: 101/78  Pulse: 78  Temp: 96.7 F (35.9 C)  TempSrc: Oral  Resp: 18  Height: 5\' 7"  (1.702 m)  Weight: 252 lb 3.2 oz (114.397 kg)  SpO2: 96%    General- adult female, in no acute distress Head- normocephalic, atraumatic Nose- no maxillary or frontal sinus tenderness, no nasal discharge Throat- moist mucus membrane Eyes- PERRLA, EOMI, no pallor, no icterus, no discharge, normal conjunctiva, normal sclera Neck- no cervical lymphadenopathy Cardiovascular- normal s1,s2, no murmur, trace right leg edema Respiratory- bilateral clear to auscultation, no wheeze, no rhonchi, no crackles, no use of accessory muscles Abdomen- bowel sounds present, soft, non tender Musculoskeletal- able to move all 4 extremities, limited right knee range of motion  Neurological- no focal deficit, alert and oriented to person, place and time Skin- warm and dry, right knee surgical incision with steristrip healing well, minimal peri-incisional edema, no drainage Psychiatry- normal mood and affect    Labs reviewed: Basic Metabolic Panel:  Recent Labs  06/28/14 0947 11/08/14 1051 11/16/14 0645  NA 138 140 135  K 3.7 4.3 4.1  CL 104 109 104  CO2 25 24 26   GLUCOSE 125* 133* 147*  BUN 10 13 11   CREATININE 1.02 0.91 1.03*  CALCIUM 9.7 9.2 8.8*   Liver Function Tests:  Recent Labs  12/06/13 0930 03/24/14 1102 03/25/14 1725  AST 29 25 28   ALT 22 22 23   ALKPHOS 63 72 69  BILITOT 0.34 0.4 0.3  PROT 6.6 7.4 7.2  ALBUMIN 3.0* 3.7 3.7   No results for input(s): LIPASE, AMYLASE in the last 8760 hours. No results for input(s): AMMONIA in the last 8760 hours. CBC:  Recent Labs  12/06/13 0930 03/25/14 1725  11/16/14 0645 11/17/14 0612 11/18/14 0354  WBC 6.0 8.1  < >  9.9 9.2 8.4  NEUTROABS 3.6 5.6  --   --   --   --   HGB 13.4 13.8  < > 12.5 11.1* 10.6*  HCT 43.0 42.6  < > 38.4 34.0* 32.7*  MCV 92.1 91.3  < > 97.2 98.3 98.5  PLT 216 253.0  < > 199 166 178  < > = values in this interval not displayed. Cardiac Enzymes: No results for input(s): CKTOTAL, CKMB, CKMBINDEX, TROPONINI in the last 8760 hours. BNP: Invalid input(s): POCBNP CBG:  Recent Labs  11/17/14 2217 11/18/14 0649 11/18/14 1201  GLUCAP 97 81 126*    Radiological Exams: Mm Digital Diagnostic Unilat L  11/11/2014  CLINICAL DATA:  Post biopsy mammogram of the left breast. EXAM: DIAGNOSTIC LEFT MAMMOGRAM POST STEREOTACTIC BIOPSY COMPARISON:  Previous exam(s). FINDINGS: Mammographic images were obtained following stereotactic guided biopsy of calcifications in the upper-outer quadrant of the left breast. The X shaped biopsy marking clip is appropriately positioned at the intended site of biopsy in the left breast. IMPRESSION: Appropriate positioning of the X shaped biopsy marking clip in the upper-outer quadrant of the left breast at the intended site of biopsy. Final Assessment: Post Procedure Mammograms for Marker Placement Electronically Signed   By: Ammie Ferrier M.D.   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 This was found to be concordant by  Dr. Ammie Ferrier. Pathology was discussed with the patient by telephone. She reported doing well after the biopsy. Post biopsy instructions and care were reviewed and her questions were answered. She was encouraged to call [facility] for any additional concerns. She was asked to return for annual screening mammography. Pathology results reported by Susa Raring RN, BSN on November 14, 2014. Electronically Signed   By:  Ammie Ferrier M.D.   On: 11/14/2014 15:15  11/16/2014  CLINICAL DATA:  54 year old female presenting for stereotactic biopsy of left breast calcifications. EXAM: LEFT BREAST STEREOTACTIC CORE NEEDLE BIOPSY COMPARISON:  Previous exams. FINDINGS: The patient and I discussed the procedure of stereotactic-guided biopsy including benefits and alternatives. We discussed the high likelihood of a successful procedure. We discussed the risks of the procedure including infection, bleeding, tissue injury, clip migration, and inadequate sampling. Informed written consent was given. The usual time out protocol was performed immediately prior to the procedure. Using sterile technique and 2% Lidocaine as local anesthetic, under stereotactic guidance, a 9 gauge vacuum assisted device was used to perform core needle biopsy of calcifications in the upper-outer quadrant of the left breast using a lateral approach. Specimen radiograph was performed showing calcifications in several samples. Specimens with calcifications are identified for pathology. At the conclusion of the procedure, a X tissue marker clip was deployed into the biopsy cavity. Follow-up 2-view mammogram was performed and dictated separately. IMPRESSION: Stereotactic-guided biopsy of calcifications in the upper-outer quadrant of the left breast. No apparent complications. Electronically Signed: By: Ammie Ferrier M.D. On: 11/11/2014 12:23     Assessment/Plan  Unsteady gait S/p right TKA. Will have patient work with PT/OT as tolerated to regain strength and restore function.  Fall precautions are in place.  Right knee Osteoarthritis  S/P right total knee arthroplasty. WBAT. Will have her work with physical therapy and occupational therapy team to help with gait training and muscle strengthening exercises.fall precautions. Skin care. Encourage to be out of bed.  Continue morphine 15 mg in am and oxycodone 15 mg q4h for pain. Continue xarelto for dvt  prophylaxis. Has f/u with orthopedics. Followed by physiatry team  Blood loss anemia Post op, monitor cbc  Muscle spasm Change robaxin to 500 mg bid for now and monitor. To use CPM machine  Hypertension  Stable bp, continue Coreg 12.5 mg bid and monitor bp  gerd Stable, continue home regimen nexium  Dm type 2 Continue metformin 500 mg daily, monitor cbg  Chronic Depression  continue Lexapro 20 mg daily with pamelor and monitor   Goals of care: short term rehabilitation   Labs/tests ordered: cbc, bmp  Family/ staff Communication: reviewed care plan with patient and nursing supervisor    Blanchie Serve, MD  [facility] [phone] (Monday-Friday 8 am - 5 pm) [phone] (afterhours)

## 2014-11-28 DIAGNOSIS — M25562 Pain in left knee: Secondary | ICD-10-CM | POA: Diagnosis not present

## 2014-11-28 DIAGNOSIS — R600 Localized edema: Secondary | ICD-10-CM | POA: Diagnosis not present

## 2014-11-28 DIAGNOSIS — R262 Difficulty in walking, not elsewhere classified: Secondary | ICD-10-CM | POA: Diagnosis not present

## 2014-11-28 DIAGNOSIS — Z96651 Presence of right artificial knee joint: Secondary | ICD-10-CM | POA: Diagnosis not present

## 2014-11-28 DIAGNOSIS — M1712 Unilateral primary osteoarthritis, left knee: Secondary | ICD-10-CM | POA: Diagnosis not present

## 2014-11-28 DIAGNOSIS — E669 Obesity, unspecified: Secondary | ICD-10-CM | POA: Diagnosis not present

## 2014-11-28 DIAGNOSIS — G8929 Other chronic pain: Secondary | ICD-10-CM | POA: Diagnosis not present

## 2014-11-28 DIAGNOSIS — N393 Stress incontinence (female) (male): Secondary | ICD-10-CM | POA: Diagnosis not present

## 2014-11-28 DIAGNOSIS — M25561 Pain in right knee: Secondary | ICD-10-CM | POA: Diagnosis not present

## 2014-11-30 DIAGNOSIS — R600 Localized edema: Secondary | ICD-10-CM | POA: Diagnosis not present

## 2014-11-30 DIAGNOSIS — M1711 Unilateral primary osteoarthritis, right knee: Secondary | ICD-10-CM | POA: Diagnosis not present

## 2014-11-30 DIAGNOSIS — Z96651 Presence of right artificial knee joint: Secondary | ICD-10-CM | POA: Diagnosis not present

## 2014-11-30 DIAGNOSIS — N393 Stress incontinence (female) (male): Secondary | ICD-10-CM | POA: Diagnosis not present

## 2014-11-30 DIAGNOSIS — M1712 Unilateral primary osteoarthritis, left knee: Secondary | ICD-10-CM | POA: Diagnosis not present

## 2014-11-30 DIAGNOSIS — M25561 Pain in right knee: Secondary | ICD-10-CM | POA: Diagnosis not present

## 2014-11-30 DIAGNOSIS — E669 Obesity, unspecified: Secondary | ICD-10-CM | POA: Diagnosis not present

## 2014-11-30 DIAGNOSIS — R262 Difficulty in walking, not elsewhere classified: Secondary | ICD-10-CM | POA: Diagnosis not present

## 2014-11-30 DIAGNOSIS — G8929 Other chronic pain: Secondary | ICD-10-CM | POA: Diagnosis not present

## 2014-11-30 DIAGNOSIS — M25562 Pain in left knee: Secondary | ICD-10-CM | POA: Diagnosis not present

## 2014-12-01 ENCOUNTER — Telehealth: Payer: Self-pay | Admitting: Internal Medicine

## 2014-12-01 DIAGNOSIS — R262 Difficulty in walking, not elsewhere classified: Secondary | ICD-10-CM | POA: Diagnosis not present

## 2014-12-01 DIAGNOSIS — M25561 Pain in right knee: Secondary | ICD-10-CM | POA: Diagnosis not present

## 2014-12-01 DIAGNOSIS — Z96651 Presence of right artificial knee joint: Secondary | ICD-10-CM | POA: Diagnosis not present

## 2014-12-01 DIAGNOSIS — M25562 Pain in left knee: Secondary | ICD-10-CM | POA: Diagnosis not present

## 2014-12-01 DIAGNOSIS — E669 Obesity, unspecified: Secondary | ICD-10-CM | POA: Diagnosis not present

## 2014-12-01 DIAGNOSIS — G8929 Other chronic pain: Secondary | ICD-10-CM | POA: Diagnosis not present

## 2014-12-01 DIAGNOSIS — M1712 Unilateral primary osteoarthritis, left knee: Secondary | ICD-10-CM | POA: Diagnosis not present

## 2014-12-01 DIAGNOSIS — R600 Localized edema: Secondary | ICD-10-CM | POA: Diagnosis not present

## 2014-12-01 DIAGNOSIS — N393 Stress incontinence (female) (male): Secondary | ICD-10-CM | POA: Diagnosis not present

## 2014-12-01 NOTE — Telephone Encounter (Signed)
Pt is being release from rehab on Saturday and they wanted her to contact us about her oxyCODONE (ROXICODONE) 15 MG immediate release tablet [id number]  She will be running out of it on 11/25 and they wanted to be sure that we will have a prescription for her ready to be picked up by 11/23

## 2014-12-02 ENCOUNTER — Non-Acute Institutional Stay (SKILLED_NURSING_FACILITY): Payer: Medicare Other | Admitting: Adult Health

## 2014-12-02 DIAGNOSIS — G4700 Insomnia, unspecified: Secondary | ICD-10-CM

## 2014-12-02 DIAGNOSIS — E876 Hypokalemia: Secondary | ICD-10-CM

## 2014-12-02 DIAGNOSIS — E1165 Type 2 diabetes mellitus with hyperglycemia: Secondary | ICD-10-CM

## 2014-12-02 DIAGNOSIS — F329 Major depressive disorder, single episode, unspecified: Secondary | ICD-10-CM | POA: Diagnosis not present

## 2014-12-02 DIAGNOSIS — F32A Depression, unspecified: Secondary | ICD-10-CM

## 2014-12-02 DIAGNOSIS — K219 Gastro-esophageal reflux disease without esophagitis: Secondary | ICD-10-CM | POA: Diagnosis not present

## 2014-12-02 DIAGNOSIS — M1711 Unilateral primary osteoarthritis, right knee: Secondary | ICD-10-CM

## 2014-12-02 DIAGNOSIS — I10 Essential (primary) hypertension: Secondary | ICD-10-CM

## 2014-12-02 DIAGNOSIS — IMO0001 Reserved for inherently not codable concepts without codable children: Secondary | ICD-10-CM

## 2014-12-02 DIAGNOSIS — G43809 Other migraine, not intractable, without status migrainosus: Secondary | ICD-10-CM | POA: Diagnosis not present

## 2014-12-02 MED ORDER — OXYCODONE HCL 15 MG PO TABS: 15.0000 mg | ORAL_TABLET | ORAL | Status: DC | PRN

## 2014-12-02 NOTE — Telephone Encounter (Signed)
rx printed for Pcp

## 2014-12-02 NOTE — Telephone Encounter (Signed)
Pt informed

## 2014-12-02 NOTE — Telephone Encounter (Signed)
OK to fill this prescription with additional refills x0 Thank you!  

## 2014-12-03 ENCOUNTER — Other Ambulatory Visit: Payer: Self-pay | Admitting: Adult Health

## 2014-12-04 ENCOUNTER — Encounter: Payer: Self-pay | Admitting: Adult Health

## 2014-12-04 NOTE — Progress Notes (Signed)
Patient ID: [name], female   DOB: December 09, 1960, 54 y.o.   MRN: [id number]    DATE:  12/02/14  MRN:  [id number]  BIRTHDAY: 1960/10/06  Facility:  [facility]ursing Home Location:  [place] Room Number: 208-P  LEVEL OF CARE:  SNF (31)  Contact Information    Name Relation Home Work [place] Sister [phone]     [place], [place] [phone]         Chief Complaint  Patient presents with  . Discharge Note    Osteoarthritis, hypertension, depression, hypokalemia, anxiety, diabetes mellitus, GERD, insomnia and migraine headache    HISTORY OF PRESENT ILLNESS:  This is a 54 year old female who is for discharge home with home health PT and OT. She has been admitted to [facility] on 11/18/14 from [facility]. She has PMH of sarcoidosis, anxiety, hypertension, diabetes mellitus, OSA not on CPAP, depression, at a and TIA. CNS osteoarthritis for which she had right total knee arthroplasty on 11/15/14.   Patient was admitted to this facility for short-term rehabilitation after the patient's recent hospitalization.  Patient has completed SNF rehabilitation and therapy has cleared the patient for discharge.  PAST MEDICAL HISTORY:  Past Medical History  Diagnosis Date  . Osteoarthritis   . Sarcoidosis ([place])     Dr. Lolita Patella  . Anxiety   . Vitamin D deficiency     is supposed to take Vit D but can't afford it  . Allergic rhinitis   . ALLERGIC RHINITIS 10/26/2009  . ANXIETY 08/14/2006  . B12 DEFICIENCY 08/25/2007  . VITAMIN D DEFICIENCY 08/25/2007  . Hypertension     takes Coreg daily  . HYPERTENSION 08/27/2007  . DYSPNEA 04/28/2009    with exertion  . Shortness of breath   . Pneumonia     over 30 yrs ago  . Headache     last migraine 2-76yrs ago;takes Topamax daily  . Confusion   . Short-term memory loss   . Long-term memory loss   . TIA (transient ischemic attack)   . Joint pain   . Joint swelling   .  Rheumatoid arthritis ([place])   . Osteoarthritis   . Lichen planus   . Insomnia     takes Nortriptyline nightly   . Esophageal reflux     takes Nexium daily  . Depression     takes Lexapro daily  . DEPRESSION 08/14/2006  . Urinary urgency   . Nocturia   . Diabetes mellitus without complication ([place])     was on insulin but has been off since Nov 2015 and now only takes Metformin daily  . OSA (obstructive sleep apnea)     doesn't use CPAP;sleep study in epic from 2006  . History of shingles      CURRENT MEDICATIONS: Reviewed  Patient's Medications  New Prescriptions   No medications on file  Previous Medications   BLOOD GLUCOSE MONITORING SUPPL (FREESTYLE LITE) DEVI    Use to check blood sugar 2 times per day   CARVEDILOL (COREG) 12.5 MG TABLET    TAKE 1 TABLET BY MOUTH TWICE DAILY WITH MEALS   CHOLECALCIFEROL (VITAMIN D) 1000 UNITS TABLET    Take 1 tablet (1,000 Units total) by mouth daily.   CLOBETASOL (TEMOVATE) 0.05 % EXTERNAL SOLUTION    Apply 1 application topically 2 (two) times daily.   ESCITALOPRAM (LEXAPRO) 20 MG TABLET    TAKE 1 TABLET BY MOUTH DAILY   GLUCOSE BLOOD (FREESTYLE  LITE) TEST STRIP    TEST TWICE DAILY   KLOR-CON 10 10 MEQ TABLET    TAKE 1 TABLET BY MOUTH DAILY   LANCETS (FREESTYLE) LANCETS    1 each by Other route 2 (two) times daily. Use as instructed   METFORMIN (GLUCOPHAGE-XR) 500 MG 24 HR TABLET    Take 500 mg by mouth daily with breakfast.   METHOCARBAMOL (ROBAXIN) 500 MG TABLET    Take 500 mg by mouth 2 (two) times daily.   NEXIUM 40 MG CAPSULE    TAKE ONE CAPSULE BY MOUTH EVERY DAY BEFORE BREAKFAST   NORTRIPTYLINE (PAMELOR) 25 MG CAPSULE    TAKE 1 TO 2 CAPSULES BY MOUTH EVERY NIGHT AT BEDTIME AS NEEDED FOR INSOMNIA   OXYCODONE (ROXICODONE) 15 MG IMMEDIATE RELEASE TABLET    Take 15 mg by mouth every 4 (four) hours.   RIVAROXABAN (XARELTO) 10 MG TABS TABLET    Take 1 tablet (10 mg total) by mouth daily with breakfast.   TOPIRAMATE (TOPAMAX) 50 MG TABLET     TAKE 1 TABLET BY MOUTH TWICE DAILY  Modified Medications   No medications on file  Discontinued Medications   DIAZEPAM (VALIUM) 10 MG TABLET    Take 5 mg by mouth daily as needed for anxiety (before procedures).    METHOCARBAMOL (ROBAXIN) 500 MG TABLET    Take 500 mg by mouth at bedtime as needed for muscle spasms.   OXYCODONE (ROXICODONE) 15 MG IMMEDIATE RELEASE TABLET    Take 1 tablet (15 mg total) by mouth every 4 (four) hours as needed for pain (Hold for sedation).     Allergies  Allergen Reactions  . Enalapril Maleate     REACTION: cough  . Hydrochlorothiazide     REACTION: Low potassium  . Hydroxychloroquine Sulfate     REACTION: vision changes  . Iodine     Mother was intolerant--CODED on CT table---pt never tired.  . Latex     Hives     REVIEW OF SYSTEMS:  GENERAL: no change in appetite, no fatigue, no weight changes, no fever, chills or weakness EYES: Denies change in vision, dry eyes, eye pain, itching or discharge EARS: Denies change in hearing, ringing in ears, or earache NOSE: Denies nasal congestion or epistaxis MOUTH and THROAT: Denies oral discomfort, gingival pain or bleeding, pain from teeth or hoarseness   RESPIRATORY: no cough, SOB, DOE, wheezing, hemoptysis CARDIAC: no chest pain, edema or palpitations GI: no abdominal pain, diarrhea, constipation, heart burn, nausea or vomiting GU: Denies dysuria, frequency, hematuria, incontinence, or discharge PSYCHIATRIC: Denies feeling of depression or anxiety. No report of hallucinations, insomnia, paranoia, or agitation   PHYSICAL EXAMINATION  GENERAL APPEARANCE: Well nourished. In no acute distress. Obese SKIN:  Right knee surgical incision is healed, no erythema HEAD: Normal in size and contour. No evidence of trauma EYES: Lids open and close normally. No blepharitis, entropion or ectropion. PERRL. Conjunctivae are clear and sclerae are white. Lenses are without opacity EARS: Pinnae are normal. Patient hears  normal voice tunes of the examiner MOUTH and THROAT: Lips are without lesions. Oral mucosa is moist and without lesions. Tongue is normal in shape, size, and color and without lesions NECK: supple, trachea midline, no neck masses, no thyroid tenderness, no thyromegaly LYMPHATICS: no LAN in the neck, no supraclavicular LAN RESPIRATORY: breathing is even & unlabored, BS CTAB CARDIAC: RRR, no murmur,no extra heart sounds, no edema GI: abdomen soft, normal BS, no masses, no tenderness, no hepatomegaly, no splenomegaly  EXTREMITIES:  Able to move 4 extremities PSYCHIATRIC: Alert and oriented X 3. Affect and behavior are appropriate  LABS/RADIOLOGY: Labs reviewed: 11/23/14  sodium 138 potassium 4.0 glucose 98 BUN 9 creatinine 0.90 calcium 8.7 WBC 6.2 hemoglobin 11.5 hematocrit 36.4 MCV 98.6 platelet [id number] Basic Metabolic Panel:  Recent Labs  06/28/14 0947 11/08/14 1051 11/16/14 0645  NA 138 140 135  K 3.7 4.3 4.1  CL 104 109 104  CO2 25 24 26   GLUCOSE 125* 133* 147*  BUN 10 13 11   CREATININE 1.02 0.91 1.03*  CALCIUM 9.7 9.2 8.8*   Liver Function Tests:  Recent Labs  12/06/13 0930 03/24/14 1102 03/25/14 1725  AST 29 25 28   ALT 22 22 23   ALKPHOS 63 72 69  BILITOT 0.34 0.4 0.3  PROT 6.6 7.4 7.2  ALBUMIN 3.0* 3.7 3.7   CBC:  Recent Labs  12/06/13 0930 03/25/14 1725  11/16/14 0645 11/17/14 0612 11/18/14 0354  WBC 6.0 8.1  < > 9.9 9.2 8.4  NEUTROABS 3.6 5.6  --   --   --   --   HGB 13.4 13.8  < > 12.5 11.1* 10.6*  HCT 43.0 42.6  < > 38.4 34.0* 32.7*  MCV 92.1 91.3  < > 97.2 98.3 98.5  PLT 216 253.0  < > 199 166 178  < > = values in this interval not displayed.  CBG:  Recent Labs  11/17/14 2217 11/18/14 0649 11/18/14 1201  GLUCAP 97 81 126*     Mm Digital Diagnostic Unilat L  11/11/2014  CLINICAL DATA:  Post biopsy mammogram of the left breast. EXAM: DIAGNOSTIC LEFT MAMMOGRAM POST STEREOTACTIC BIOPSY COMPARISON:  Previous exam(s). FINDINGS: Mammographic  images were obtained following stereotactic guided biopsy of calcifications in the upper-outer quadrant of the left breast. The X shaped biopsy marking clip is appropriately positioned at the intended site of biopsy in the left breast. IMPRESSION: Appropriate positioning of the X shaped biopsy marking clip in the upper-outer quadrant of the left breast at the intended site of biopsy. Final Assessment: Post Procedure Mammograms for Marker Placement Electronically Signed   By: Ammie Ferrier M.D.   On: 11/11/2014 12:25   Mm Digital Diagnostic Unilat L  11/09/2014  CLINICAL DATA:  Patient was called back from screening mammogram for left breast calcifications. EXAM: DIGITAL DIAGNOSTIC LEFT MAMMOGRAM COMPARISON:  Previous exam(s). ACR Breast Density Category b: There are scattered areas of fibroglandular density. FINDINGS: Magnification views of the upper-outer quadrant of the left breast was obtained. There is 10 mm of grouped fine calcifications that very in size and shape. Associated with the grouped calcification is a larger calcification that layers consistent with milk of calcium. The remainder of the calcifications do not appear to be milk of calcium. There is no associated mass. IMPRESSION: Suspicious developing calcifications in the upper-outer quadrant of the left breast. RECOMMENDATION: Stereotactic biopsy of the left breast calcifications is recommended. This will be scheduled at the patient's convenience. I have discussed the findings and recommendations with the patient. Results were also provided in writing at the conclusion of the visit. If applicable, a reminder letter will be sent to the patient regarding the next appointment. BI-RADS CATEGORY  4: Suspicious. Electronically Signed   By: Lillia Mountain M.D.   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 This was found to be concordant by Dr. Ammie Ferrier. Pathology was discussed with the patient by telephone. She reported doing well after the biopsy. Post biopsy instructions and care were reviewed and her questions were answered. She was encouraged to call [facility] for any additional concerns. She was asked to return for annual screening mammography. Pathology results reported by Susa Raring RN, BSN on November 14, 2014. Electronically Signed   By: Ammie Ferrier M.D.   On: 11/14/2014 15:15  11/16/2014  CLINICAL DATA:  54 year old female presenting for stereotactic biopsy of left breast calcifications. EXAM: LEFT BREAST STEREOTACTIC CORE NEEDLE BIOPSY COMPARISON:  Previous exams. FINDINGS: The patient and I discussed the procedure of stereotactic-guided biopsy including benefits and alternatives. We discussed the high likelihood of a successful procedure. We discussed the risks of the procedure including infection, bleeding, tissue injury, clip migration, and inadequate sampling. Informed written consent was given. The usual time out protocol was performed immediately prior to the procedure. Using sterile technique and 2% Lidocaine as local anesthetic, under stereotactic guidance, a 9 gauge vacuum assisted device was used to perform core needle biopsy of calcifications in the upper-outer quadrant of the left breast using a lateral approach. Specimen radiograph was performed showing calcifications in several samples. Specimens with calcifications are identified for pathology. At the conclusion of the procedure, a X tissue marker clip was deployed into the biopsy cavity. Follow-up 2-view mammogram was performed and dictated separately. IMPRESSION: Stereotactic-guided biopsy of calcifications in the upper-outer quadrant of the left breast. No apparent complications. Electronically Signed: By: Ammie Ferrier M.D. On:  11/11/2014 12:23    ASSESSMENT/PLAN:  Osteoarthritis S/P right total knee arthroplasty - for home health PT and OT; WBAT; change oxycodone to 15 mg 1 tab by mouth every 4 hours for pain; continue Xarelto 10 mg 1 tab by mouth daily for DVT prophylaxis; Robaxin 500 mg 1 tab PO BID for muscle spasm; follow-up with Dr. Marlou Sa, orthopedic surgeon  Hypertension - continue Coreg 12.5 mg 1 tab by mouth twice a day  Depression - mood is stable; continue Lexapro 20 mg 1 tab by mouth daily  Hypokalemia - K4.0; continue Klor-Con 10 MEQ by mouth daily; check BMP  Diabetes mellitus, type II - hemoglobin A1c 6.2; continue metformin 500 mg 24 hour 1 tab by mouth daily  GERD - continue Nexium 40 mg 1 capsule by mouth daily  Insomnia - continue Pamelor 25 mg 1 capsule by mouth daily at bedtime when necessary  Migraine - continue Topamax 50 mg 1 tab by mouth twice a day     I have filled out patient's discharge paperwork and written prescriptions.  Patient will receive home health PT and OT.  Total discharge time: Greater than 30 minutes  Discharge time involved coordination of the discharge process with social worker, nursing staff and therapy department. Medical justification for home health services verified.   [facility], NP [employer] [phone]

## 2014-12-04 NOTE — Progress Notes (Signed)
Patient ID: [name], female   DOB: [birth year], 54 y.o.   MRN: [id number]    DATE:  11/21/14  MRN:  [id number]  BIRTHDAY: 1960/10/12  Facility:  Nursing Home Location:  [place] Room Number: 208-P  LEVEL OF CARE:  SNF (31)  Contact Information    Name Relation Home Work [place] Sister [phone]     [place], [place] [phone]         Chief Complaint  Patient presents with  . Hospitalization Follow-up    Osteoarthritis, hypertension, depression, hypokalemia, anxiety, diabetes mellitus, GERD, insomnia and migraine headache    HISTORY OF PRESENT ILLNESS:  This is a 54 year old female who has been admitted to [facility] on 11/18/14 from [facility]. She has PMH of sarcoidosis, anxiety, hypertension, diabetes mellitus, OSA not on CPAP, depression, at a and TIA. CNS osteoarthritis for which she had right total knee arthroplasty on 11/15/14. She has been admitted for a short-term rehabilitation.  PAST MEDICAL HISTORY:  Past Medical History  Diagnosis Date  . Osteoarthritis   . Sarcoidosis ([place])     Dr. Lolita Patella  . Anxiety   . Vitamin D deficiency     is supposed to take Vit D but can't afford it  . Allergic rhinitis   . ALLERGIC RHINITIS 10/26/2009  . ANXIETY 08/14/2006  . B12 DEFICIENCY 08/25/2007  . VITAMIN D DEFICIENCY 08/25/2007  . Hypertension     takes Coreg daily  . HYPERTENSION 08/27/2007  . DYSPNEA 04/28/2009    with exertion  . Shortness of breath   . Pneumonia     over 30 yrs ago  . Headache     last migraine 2-46yrs ago;takes Topamax daily  . Confusion   . Short-term memory loss   . Long-term memory loss   . TIA (transient ischemic attack)   . Joint pain   . Joint swelling   . Rheumatoid arthritis ([place])   . Osteoarthritis   . Lichen planus   . Insomnia     takes Nortriptyline nightly   . Esophageal reflux     takes Nexium daily  . Depression     takes Lexapro daily   . DEPRESSION 08/14/2006  . Urinary urgency   . Nocturia   . Diabetes mellitus without complication ([place])     was on insulin but has been off since Nov 2015 and now only takes Metformin daily  . OSA (obstructive sleep apnea)     doesn't use CPAP;sleep study in epic from 2006  . History of shingles      CURRENT MEDICATIONS: Reviewed  Patient's Medications  New Prescriptions   No medications on file  Previous Medications   BLOOD GLUCOSE MONITORING SUPPL (FREESTYLE LITE) DEVI    Use to check blood sugar 2 times per day   CARVEDILOL (COREG) 12.5 MG TABLET    TAKE 1 TABLET BY MOUTH TWICE DAILY WITH MEALS   CHOLECALCIFEROL (VITAMIN D) 1000 UNITS TABLET    Take 1 tablet (1,000 Units total) by mouth daily.   CLOBETASOL (TEMOVATE) 0.05 % EXTERNAL SOLUTION    Apply 1 application topically 2 (two) times daily.   DIAZEPAM (VALIUM) 10 MG TABLET    Take 5 mg by mouth daily as needed for anxiety (before procedures).    ESCITALOPRAM (LEXAPRO) 20 MG TABLET    TAKE 1 TABLET BY MOUTH DAILY   GLUCOSE BLOOD (FREESTYLE LITE) TEST STRIP    TEST TWICE DAILY  KLOR-CON 10 10 MEQ TABLET    TAKE 1 TABLET BY MOUTH DAILY   LANCETS (FREESTYLE) LANCETS    1 each by Other route 2 (two) times daily. Use as instructed   METFORMIN (GLUCOPHAGE-XR) 500 MG 24 HR TABLET    Take 500 mg by mouth daily with breakfast.   METHOCARBAMOL (ROBAXIN) 500 MG TABLET    Take 500 mg by mouth at bedtime as needed for muscle spasms.   NEXIUM 40 MG CAPSULE    TAKE ONE CAPSULE BY MOUTH EVERY DAY BEFORE BREAKFAST   NORTRIPTYLINE (PAMELOR) 25 MG CAPSULE    TAKE 1 TO 2 CAPSULES BY MOUTH EVERY NIGHT AT BEDTIME AS NEEDED FOR INSOMNIA   OXYCODONE (ROXICODONE) 15 MG IMMEDIATE RELEASE TABLET    Take 1 tablet (15 mg total) by mouth every 4 (four) hours as needed for pain (Hold for sedation).   RIVAROXABAN (XARELTO) 10 MG TABS TABLET    Take 1 tablet (10 mg total) by mouth daily with breakfast.   TOPIRAMATE (TOPAMAX) 50 MG TABLET    TAKE 1 TABLET BY  MOUTH TWICE DAILY  Modified Medications   No medications on file  Discontinued Medications   No medications on file     Allergies  Allergen Reactions  . Enalapril Maleate     REACTION: cough  . Hydrochlorothiazide     REACTION: Low potassium  . Hydroxychloroquine Sulfate     REACTION: vision changes  . Iodine     Mother was intolerant--CODED on CT table---pt never tired.  . Latex     Hives     REVIEW OF SYSTEMS:  GENERAL: no change in appetite, no fatigue, no weight changes, no fever, chills or weakness EYES: Denies change in vision, dry eyes, eye pain, itching or discharge EARS: Denies change in hearing, ringing in ears, or earache NOSE: Denies nasal congestion or epistaxis MOUTH and THROAT: Denies oral discomfort, gingival pain or bleeding, pain from teeth or hoarseness   RESPIRATORY: no cough, SOB, DOE, wheezing, hemoptysis CARDIAC: no chest pain, edema or palpitations GI: no abdominal pain, diarrhea, constipation, heart burn, nausea or vomiting GU: Denies dysuria, frequency, hematuria, incontinence, or discharge PSYCHIATRIC: Denies feeling of depression or anxiety. No report of hallucinations, insomnia, paranoia, or agitation   PHYSICAL EXAMINATION  GENERAL APPEARANCE: Well nourished. In no acute distress. Obese SKIN:  Right knee surgical incision has Steri-Strips and dry dressing, no erythema HEAD: Normal in size and contour. No evidence of trauma EYES: Lids open and close normally. No blepharitis, entropion or ectropion. PERRL. Conjunctivae are clear and sclerae are white. Lenses are without opacity EARS: Pinnae are normal. Patient hears normal voice tunes of the examiner MOUTH and THROAT: Lips are without lesions. Oral mucosa is moist and without lesions. Tongue is normal in shape, size, and color and without lesions NECK: supple, trachea midline, no neck masses, no thyroid tenderness, no thyromegaly LYMPHATICS: no LAN in the neck, no supraclavicular  LAN RESPIRATORY: breathing is even & unlabored, BS CTAB CARDIAC: RRR, no murmur,no extra heart sounds, no edema GI: abdomen soft, normal BS, no masses, no tenderness, no hepatomegaly, no splenomegaly EXTREMITIES:  Able to move 4 extremities PSYCHIATRIC: Alert and oriented X 3. Affect and behavior are appropriate  LABS/RADIOLOGY: Labs reviewed: Basic Metabolic Panel:  Recent Labs  06/28/14 0947 11/08/14 1051 11/16/14 0645  NA 138 140 135  K 3.7 4.3 4.1  CL 104 109 104  CO2 25 24 26   GLUCOSE 125* 133* 147*  BUN 10 13 11  CREATININE 1.02 0.91 1.03*  CALCIUM 9.7 9.2 8.8*   Liver Function Tests:  Recent Labs  12/06/13 0930 03/24/14 1102 03/25/14 1725  AST 29 25 28   ALT 22 22 23   ALKPHOS 63 72 69  BILITOT 0.34 0.4 0.3  PROT 6.6 7.4 7.2  ALBUMIN 3.0* 3.7 3.7   CBC:  Recent Labs  12/06/13 0930 03/25/14 1725  11/16/14 0645 11/17/14 0612 11/18/14 0354  WBC 6.0 8.1  < > 9.9 9.2 8.4  NEUTROABS 3.6 5.6  --   --   --   --   HGB 13.4 13.8  < > 12.5 11.1* 10.6*  HCT 43.0 42.6  < > 38.4 34.0* 32.7*  MCV 92.1 91.3  < > 97.2 98.3 98.5  PLT 216 253.0  < > 199 166 178  < > = values in this interval not displayed.  CBG:  Recent Labs  11/17/14 2217 11/18/14 0649 11/18/14 1201  GLUCAP 97 81 126*     Mm Digital Diagnostic Unilat L  11/11/2014  CLINICAL DATA:  Post biopsy mammogram of the left breast. EXAM: DIAGNOSTIC LEFT MAMMOGRAM POST STEREOTACTIC BIOPSY COMPARISON:  Previous exam(s). FINDINGS: Mammographic images were obtained following stereotactic guided biopsy of calcifications in the upper-outer quadrant of the left breast. The X shaped biopsy marking clip is appropriately positioned at the intended site of biopsy in the left breast. IMPRESSION: Appropriate positioning of the X shaped biopsy marking clip in the upper-outer quadrant of the left breast at the intended site of biopsy. Final Assessment: Post Procedure Mammograms for Marker Placement Electronically  Signed   By: Ammie Ferrier M.D.   On: 11/11/2014 12:25   Mm Digital Diagnostic Unilat L  11/09/2014  CLINICAL DATA:  Patient was called back from screening mammogram for left breast calcifications. EXAM: DIGITAL DIAGNOSTIC LEFT MAMMOGRAM COMPARISON:  Previous exam(s). ACR Breast Density Category b: There are scattered areas of fibroglandular density. FINDINGS: Magnification views of the upper-outer quadrant of the left breast was obtained. There is 10 mm of grouped fine calcifications that very in size and shape. Associated with the grouped calcification is a larger calcification that layers consistent with milk of calcium. The remainder of the calcifications do not appear to be milk of calcium. There is no associated mass. IMPRESSION: Suspicious developing calcifications in the upper-outer quadrant of the left breast. RECOMMENDATION: Stereotactic biopsy of the left breast calcifications is recommended. This will be scheduled at the patient's convenience. I have discussed the findings and recommendations with the patient. Results were also provided in writing at the conclusion of the visit. If applicable, a reminder letter will be sent to the patient regarding the next appointment. BI-RADS CATEGORY  4: Suspicious. Electronically Signed   By: Lillia Mountain M.D.   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 This was found to be concordant by Dr. Ammie Ferrier. Pathology was discussed with the patient by telephone. She reported doing well after the biopsy. Post biopsy instructions and care were reviewed and her questions were answered. She was encouraged to call [facility] for any additional concerns. She was asked to return for annual screening mammography. Pathology results reported  by Susa Raring RN, BSN on November 14, 2014. Electronically Signed   By: Ammie Ferrier M.D.   On: 11/14/2014 15:15  11/16/2014  CLINICAL DATA:  54 year old female presenting for stereotactic biopsy of left breast calcifications. EXAM: LEFT BREAST STEREOTACTIC CORE NEEDLE BIOPSY COMPARISON:  Previous exams. FINDINGS: The patient and I discussed the procedure of stereotactic-guided biopsy including benefits and alternatives. We discussed the high likelihood of a successful procedure. We discussed the risks of the procedure including infection, bleeding, tissue injury, clip migration, and inadequate sampling. Informed written consent was given. The usual time out protocol was performed immediately prior to the procedure. Using sterile technique and 2% Lidocaine as local anesthetic, under stereotactic guidance, a 9 gauge vacuum assisted device was used to perform core needle biopsy of calcifications in the upper-outer quadrant of the left breast using a lateral approach. Specimen radiograph was performed showing calcifications in several samples. Specimens with calcifications are identified for pathology. At the conclusion of the procedure, a X tissue marker clip was deployed into the biopsy cavity. Follow-up 2-view mammogram was performed and dictated separately. IMPRESSION: Stereotactic-guided biopsy of calcifications in the upper-outer quadrant of the left breast. No apparent complications. Electronically Signed: By: Ammie Ferrier M.D. On: 11/11/2014 12:23    ASSESSMENT/PLAN:  Osteoarthritis S/P right total knee arthroplasty - for rehabilitation; WBAT; change oxycodone to 30 mg 1 tab by mouth every 6 hours and morphine 15 mg 12 hour 2 tabs by mouth daily at bedtime when necessary for pain; continue Xarelto 10 mg 1 tab by mouth daily for DVT prophylaxis; follow-up with Dr. Marlou Sa, orthopedic surgeon, in 1 week; check CBC; physiatry consultation   Hypertension - continue Coreg 12.5 mg 1 tab by mouth twice  a day  Depression - mood this is stable; continue Lexapro 20 mg 1 tab by mouth daily  Hypokalemia - K4.1; continue Klor-Con 10 MEQ by mouth daily; check BMP  Diabetes mellitus, type II - hemoglobin A1c 6.2; continue metformin 500 mg 24 hour 1 tab by mouth daily  GERD - continue Nexium 40 mg 1 capsule by mouth daily  Insomnia - continue Pamelor 25 mg 1 capsule by mouth daily at bedtime when necessary  Migraine - continue Topamax 50 mg 1 tab by mouth twice a day    Goals of care:  Short-term rehabilitation   [facility], [facility] [phone]

## 2014-12-05 DIAGNOSIS — I2510 Atherosclerotic heart disease of native coronary artery without angina pectoris: Secondary | ICD-10-CM | POA: Diagnosis not present

## 2014-12-05 DIAGNOSIS — M6281 Muscle weakness (generalized): Secondary | ICD-10-CM | POA: Diagnosis not present

## 2014-12-05 DIAGNOSIS — I10 Essential (primary) hypertension: Secondary | ICD-10-CM | POA: Diagnosis not present

## 2014-12-05 DIAGNOSIS — E119 Type 2 diabetes mellitus without complications: Secondary | ICD-10-CM | POA: Diagnosis not present

## 2014-12-05 DIAGNOSIS — R262 Difficulty in walking, not elsewhere classified: Secondary | ICD-10-CM | POA: Diagnosis not present

## 2014-12-05 DIAGNOSIS — Z471 Aftercare following joint replacement surgery: Secondary | ICD-10-CM | POA: Diagnosis not present

## 2014-12-06 ENCOUNTER — Ambulatory Visit: Payer: Medicare Other | Admitting: Internal Medicine

## 2014-12-06 DIAGNOSIS — R262 Difficulty in walking, not elsewhere classified: Secondary | ICD-10-CM | POA: Diagnosis not present

## 2014-12-06 DIAGNOSIS — I2510 Atherosclerotic heart disease of native coronary artery without angina pectoris: Secondary | ICD-10-CM | POA: Diagnosis not present

## 2014-12-06 DIAGNOSIS — Z471 Aftercare following joint replacement surgery: Secondary | ICD-10-CM | POA: Diagnosis not present

## 2014-12-06 DIAGNOSIS — I10 Essential (primary) hypertension: Secondary | ICD-10-CM | POA: Diagnosis not present

## 2014-12-06 DIAGNOSIS — E119 Type 2 diabetes mellitus without complications: Secondary | ICD-10-CM | POA: Diagnosis not present

## 2014-12-06 DIAGNOSIS — M6281 Muscle weakness (generalized): Secondary | ICD-10-CM | POA: Diagnosis not present

## 2014-12-07 DIAGNOSIS — E119 Type 2 diabetes mellitus without complications: Secondary | ICD-10-CM | POA: Diagnosis not present

## 2014-12-07 DIAGNOSIS — Z471 Aftercare following joint replacement surgery: Secondary | ICD-10-CM | POA: Diagnosis not present

## 2014-12-07 DIAGNOSIS — R262 Difficulty in walking, not elsewhere classified: Secondary | ICD-10-CM | POA: Diagnosis not present

## 2014-12-07 DIAGNOSIS — I10 Essential (primary) hypertension: Secondary | ICD-10-CM | POA: Diagnosis not present

## 2014-12-07 DIAGNOSIS — I2510 Atherosclerotic heart disease of native coronary artery without angina pectoris: Secondary | ICD-10-CM | POA: Diagnosis not present

## 2014-12-07 DIAGNOSIS — M6281 Muscle weakness (generalized): Secondary | ICD-10-CM | POA: Diagnosis not present

## 2014-12-14 DIAGNOSIS — I2510 Atherosclerotic heart disease of native coronary artery without angina pectoris: Secondary | ICD-10-CM | POA: Diagnosis not present

## 2014-12-14 DIAGNOSIS — Z471 Aftercare following joint replacement surgery: Secondary | ICD-10-CM | POA: Diagnosis not present

## 2014-12-14 DIAGNOSIS — M6281 Muscle weakness (generalized): Secondary | ICD-10-CM | POA: Diagnosis not present

## 2014-12-14 DIAGNOSIS — R262 Difficulty in walking, not elsewhere classified: Secondary | ICD-10-CM | POA: Diagnosis not present

## 2014-12-14 DIAGNOSIS — E119 Type 2 diabetes mellitus without complications: Secondary | ICD-10-CM | POA: Diagnosis not present

## 2014-12-14 DIAGNOSIS — I10 Essential (primary) hypertension: Secondary | ICD-10-CM | POA: Diagnosis not present

## 2014-12-16 ENCOUNTER — Telehealth: Payer: Self-pay | Admitting: *Deleted

## 2014-12-16 DIAGNOSIS — M6281 Muscle weakness (generalized): Secondary | ICD-10-CM | POA: Diagnosis not present

## 2014-12-16 DIAGNOSIS — E119 Type 2 diabetes mellitus without complications: Secondary | ICD-10-CM | POA: Diagnosis not present

## 2014-12-16 DIAGNOSIS — R262 Difficulty in walking, not elsewhere classified: Secondary | ICD-10-CM | POA: Diagnosis not present

## 2014-12-16 DIAGNOSIS — I2510 Atherosclerotic heart disease of native coronary artery without angina pectoris: Secondary | ICD-10-CM | POA: Diagnosis not present

## 2014-12-16 DIAGNOSIS — Z471 Aftercare following joint replacement surgery: Secondary | ICD-10-CM | POA: Diagnosis not present

## 2014-12-16 DIAGNOSIS — I10 Essential (primary) hypertension: Secondary | ICD-10-CM | POA: Diagnosis not present

## 2014-12-16 MED ORDER — OXYCODONE HCL 15 MG PO TABS
15.0000 mg | ORAL_TABLET | Freq: Four times a day (QID) | ORAL | Status: DC | PRN
Start: 2014-12-16 — End: 2015-01-19

## 2014-12-16 NOTE — Telephone Encounter (Signed)
Received call pt states she will be out of her Oxycodone on Sunday. She is requesting a refill and want md to give her more on the amount. She states she takes 1 pill four times a day #30 is only lasting for 8 days. Pls advise...[name]

## 2014-12-16 NOTE — Telephone Encounter (Signed)
Done. Thx.

## 2014-12-19 NOTE — Telephone Encounter (Signed)
Notified pt rx ready for pick-up.../lmb 

## 2014-12-21 DIAGNOSIS — R262 Difficulty in walking, not elsewhere classified: Secondary | ICD-10-CM | POA: Diagnosis not present

## 2014-12-21 DIAGNOSIS — E119 Type 2 diabetes mellitus without complications: Secondary | ICD-10-CM | POA: Diagnosis not present

## 2014-12-21 DIAGNOSIS — I2510 Atherosclerotic heart disease of native coronary artery without angina pectoris: Secondary | ICD-10-CM | POA: Diagnosis not present

## 2014-12-21 DIAGNOSIS — M6281 Muscle weakness (generalized): Secondary | ICD-10-CM | POA: Diagnosis not present

## 2014-12-21 DIAGNOSIS — I10 Essential (primary) hypertension: Secondary | ICD-10-CM | POA: Diagnosis not present

## 2014-12-21 DIAGNOSIS — Z471 Aftercare following joint replacement surgery: Secondary | ICD-10-CM | POA: Diagnosis not present

## 2014-12-26 DIAGNOSIS — E119 Type 2 diabetes mellitus without complications: Secondary | ICD-10-CM | POA: Diagnosis not present

## 2014-12-26 DIAGNOSIS — Z471 Aftercare following joint replacement surgery: Secondary | ICD-10-CM | POA: Diagnosis not present

## 2014-12-26 DIAGNOSIS — R262 Difficulty in walking, not elsewhere classified: Secondary | ICD-10-CM | POA: Diagnosis not present

## 2014-12-26 DIAGNOSIS — I2510 Atherosclerotic heart disease of native coronary artery without angina pectoris: Secondary | ICD-10-CM | POA: Diagnosis not present

## 2014-12-26 DIAGNOSIS — I10 Essential (primary) hypertension: Secondary | ICD-10-CM | POA: Diagnosis not present

## 2014-12-26 DIAGNOSIS — M6281 Muscle weakness (generalized): Secondary | ICD-10-CM | POA: Diagnosis not present

## 2014-12-30 DIAGNOSIS — M6281 Muscle weakness (generalized): Secondary | ICD-10-CM | POA: Diagnosis not present

## 2014-12-30 DIAGNOSIS — I2510 Atherosclerotic heart disease of native coronary artery without angina pectoris: Secondary | ICD-10-CM | POA: Diagnosis not present

## 2014-12-30 DIAGNOSIS — Z471 Aftercare following joint replacement surgery: Secondary | ICD-10-CM | POA: Diagnosis not present

## 2014-12-30 DIAGNOSIS — R262 Difficulty in walking, not elsewhere classified: Secondary | ICD-10-CM | POA: Diagnosis not present

## 2014-12-30 DIAGNOSIS — I10 Essential (primary) hypertension: Secondary | ICD-10-CM | POA: Diagnosis not present

## 2014-12-30 DIAGNOSIS — E119 Type 2 diabetes mellitus without complications: Secondary | ICD-10-CM | POA: Diagnosis not present

## 2015-01-04 DIAGNOSIS — I2510 Atherosclerotic heart disease of native coronary artery without angina pectoris: Secondary | ICD-10-CM | POA: Diagnosis not present

## 2015-01-04 DIAGNOSIS — E119 Type 2 diabetes mellitus without complications: Secondary | ICD-10-CM | POA: Diagnosis not present

## 2015-01-04 DIAGNOSIS — M6281 Muscle weakness (generalized): Secondary | ICD-10-CM | POA: Diagnosis not present

## 2015-01-04 DIAGNOSIS — Z471 Aftercare following joint replacement surgery: Secondary | ICD-10-CM | POA: Diagnosis not present

## 2015-01-04 DIAGNOSIS — R262 Difficulty in walking, not elsewhere classified: Secondary | ICD-10-CM | POA: Diagnosis not present

## 2015-01-04 DIAGNOSIS — I10 Essential (primary) hypertension: Secondary | ICD-10-CM | POA: Diagnosis not present

## 2015-01-06 ENCOUNTER — Other Ambulatory Visit: Payer: Self-pay | Admitting: Adult Health

## 2015-01-06 DIAGNOSIS — I2510 Atherosclerotic heart disease of native coronary artery without angina pectoris: Secondary | ICD-10-CM | POA: Diagnosis not present

## 2015-01-06 DIAGNOSIS — I10 Essential (primary) hypertension: Secondary | ICD-10-CM | POA: Diagnosis not present

## 2015-01-06 DIAGNOSIS — E119 Type 2 diabetes mellitus without complications: Secondary | ICD-10-CM | POA: Diagnosis not present

## 2015-01-06 DIAGNOSIS — M6281 Muscle weakness (generalized): Secondary | ICD-10-CM | POA: Diagnosis not present

## 2015-01-06 DIAGNOSIS — R262 Difficulty in walking, not elsewhere classified: Secondary | ICD-10-CM | POA: Diagnosis not present

## 2015-01-06 DIAGNOSIS — Z471 Aftercare following joint replacement surgery: Secondary | ICD-10-CM | POA: Diagnosis not present

## 2015-01-19 ENCOUNTER — Telehealth: Payer: Self-pay | Admitting: Internal Medicine

## 2015-01-19 MED ORDER — OXYCODONE HCL 15 MG PO TABS: 15.0000 mg | ORAL_TABLET | Freq: Four times a day (QID) | ORAL | Status: DC | PRN

## 2015-01-19 NOTE — Telephone Encounter (Signed)
Pt requesting refill for oxyCODONE (ROXICODONE) 15 MG immediate release tablet [id number]

## 2015-01-19 NOTE — Telephone Encounter (Signed)
OK to fill this prescription with additional refills x0 Thank you!  

## 2015-01-19 NOTE — Telephone Encounter (Signed)
Rx printed. Holding at my desk for MD signature when he returns on 01/20/15. Pt informed Rx will be available for p/u on 01/20/15 after 9 am.

## 2015-01-20 ENCOUNTER — Ambulatory Visit: Payer: Medicare Other | Admitting: Physical Therapy

## 2015-01-25 ENCOUNTER — Ambulatory Visit: Payer: Medicare Other | Attending: Orthopedic Surgery | Admitting: Physical Therapy

## 2015-01-25 DIAGNOSIS — Z7409 Other reduced mobility: Secondary | ICD-10-CM | POA: Insufficient documentation

## 2015-01-25 DIAGNOSIS — Z96651 Presence of right artificial knee joint: Secondary | ICD-10-CM | POA: Diagnosis not present

## 2015-01-25 DIAGNOSIS — M25661 Stiffness of right knee, not elsewhere classified: Secondary | ICD-10-CM

## 2015-01-25 DIAGNOSIS — R531 Weakness: Secondary | ICD-10-CM

## 2015-01-25 DIAGNOSIS — R262 Difficulty in walking, not elsewhere classified: Secondary | ICD-10-CM | POA: Diagnosis not present

## 2015-01-25 DIAGNOSIS — M25562 Pain in left knee: Secondary | ICD-10-CM | POA: Insufficient documentation

## 2015-01-25 DIAGNOSIS — M25561 Pain in right knee: Secondary | ICD-10-CM | POA: Diagnosis not present

## 2015-01-25 DIAGNOSIS — R6889 Other general symptoms and signs: Secondary | ICD-10-CM

## 2015-01-25 NOTE — Patient Instructions (Signed)
      Copyright  VHI. All rights reserved.  Heel Slide   Bend knee and pull heel toward buttocks. Hold ___30_ seconds. Return. Use a sheet to help you pull foot up.  Repeat ___5_ times. Do __2__ sessions per day.  http://gt2.exer.us/372   Copyright  VHI. All rights reserved.     Raise leg until knee is straight. _10-20__ reps per set, __2_ sets per day, __7_ days per week  Copyright  VHI. All rights reserved.  [employer] a large can or rolled towel under leg. Straighten knee and leg. Hold _10___ seconds. Repeat with other leg. Repeat ___10-20_ times. Do __2__ sessions per day.  http://gt2.exer.us/365   Copyright  VHI. All rights reserved.  Quad Set   Slowly tighten muscles on thigh of straight leg while counting out loud to ___5-10_. Repeat with other leg. Repeat _10___ times. Do _2_ sessions per day.  http://[email or web address].exer.us/361   Copyright  VHI. All rights reserved.

## 2015-01-25 NOTE — Therapy (Signed)
[place], [place], [address] Phone: [phone]   Fax:  [phone]  Physical Therapy Evaluation  Patient Details  Name: [name] MRN: [id number] Date of Birth: 1960/09/07 Referring Provider: Marlou Sa  Encounter Date: 01/25/2015      PT End of Session - 01/25/15 1528    Visit Number 1   Number of Visits 12   Date for PT Re-Evaluation 03/08/15   PT Start Time [id number]   PT Stop Time 1525   PT Time Calculation (min) 51 min   Activity Tolerance Patient tolerated treatment well   Behavior During Therapy [facility] for tasks assessed/performed      Past Medical History  Diagnosis Date  . Osteoarthritis   . Sarcoidosis ([place])     Dr. Lolita Patella  . Anxiety   . Vitamin D deficiency     is supposed to take Vit D but can't afford it  . Allergic rhinitis   . ALLERGIC RHINITIS 10/26/2009  . ANXIETY 08/14/2006  . B12 DEFICIENCY 08/25/2007  . VITAMIN D DEFICIENCY 08/25/2007  . Hypertension     takes Coreg daily  . HYPERTENSION 08/27/2007  . DYSPNEA 04/28/2009    with exertion  . Shortness of breath   . Pneumonia     over 30 yrs ago  . Headache     last migraine 2-36yrs ago;takes Topamax daily  . Confusion   . Short-term memory loss   . Long-term memory loss   . TIA (transient ischemic attack)   . Joint pain   . Joint swelling   . Rheumatoid arthritis ([place])   . Osteoarthritis   . Lichen planus   . Insomnia     takes Nortriptyline nightly   . Esophageal reflux     takes Nexium daily  . Depression     takes Lexapro daily  . DEPRESSION 08/14/2006  . Urinary urgency   . Nocturia   . Diabetes mellitus without complication ([place])     was on insulin but has been off since Nov 2015 and now only takes Metformin daily  . OSA (obstructive sleep apnea)     doesn't use CPAP;sleep study in epic from 2006  . History of shingles     Past Surgical History  Procedure Laterality Date  . Arthroscopic knee surgery  Right 11-12-04  . Appendectomy    . Endometrial ablation    . Axillary abcess irrigation and debridement  Jul & Aug2012  . Cyst removed from top of buttocks  at age 89  . Carpal tunnel release Left 05/23/2014    Procedure: CARPAL TUNNEL RELEASE;  Surgeon: Meredith Pel, MD;  Location: [place];  Service: Orthopedics;  Laterality: Left;  . Total knee arthroplasty Right 11/15/2014    Procedure: TOTAL RIGHT KNEE ARTHROPLASTY;  Surgeon: Meredith Pel, MD;  Location: [place];  Service: Orthopedics;  Laterality: Right;    There were no vitals filed for this visit.  Visit Diagnosis:  Status post total right knee replacement  Arthralgia of both knees  Difficulty walking  Knee stiffness, right  Decreased strength, endurance, and mobility      Subjective Assessment - 01/25/15 1436    Subjective Pt underwent Rt. TKR on 11/15/14.  She then went to [place].  HHPT for several weeks.  She reports cont pain, diff walking, has swelling, muscle spasms. She has a CNA to help her with housework.  Her son helps with grocery shopping.    Pertinent History daibetes, arthritis,  fibromyalgia   Limitations Lifting;Standing;Walking;House hold activities;Other (comment)   How long can you stand comfortably? 10 min but mostly limited by L knee pain    How long can you walk comfortably? cannot walk 10 min due to L knee   Patient Stated Goals To be able to get knee stronger do I can depend on it when my L LE is done (expects to have TKR on L this yr)   Currently in Pain? Yes   Pain Score 3    Pain Location Knee   Pain Orientation Right   Pain Type Chronic pain;Surgical pain   Pain Onset More than a month ago   Pain Frequency Intermittent   Aggravating Factors  bending knee   Pain Relieving Factors meds, Voltaren gel, ice, handheld massager   Multiple Pain Sites Yes   Pain Score 10   Pain Location Knee   Pain Orientation Left   Pain Descriptors / Indicators Throbbing;Constant   Pain Type Chronic pain    Pain Onset More than a month ago   Pain Frequency Constant   Aggravating Factors  walking, WB    Pain Relieving Factors not much   Effect of Pain on Daily Activities holds her back from being more mobile            [facility] PT Assessment - 01/25/15 1438    Assessment   Medical Diagnosis Rt TKR   Referring Provider Dean   Onset Date/Surgical Date 11/15/14   Prior Therapy Yes prior to and after surgery   Precautions   Precautions None   Restrictions   Weight Bearing Restrictions No   Balance Screen   Has the patient fallen in the past 6 months Yes   How many times? 2  knees buckle   Has the patient had a decrease in activity level because of a fear of falling?  Yes   Is the patient reluctant to leave their home because of a fear of falling?  No  was prior to surgery   [place] residence   Living Arrangements Alone   Available Help at Discharge Personal care attendant   Type of [place] Access Level entry   [place] One level   [place] - 2 wheels;Walker - 4 wheels;Cane - quad;Bedside commode;Shower seat;Grab bars - tub/shower   Prior Function   Level of Independence Independent with household mobility with device;Independent with community mobility with device;Independent with gait;Needs assistance with homemaking   Cognition   Overall Cognitive Status Within Functional Limits for tasks assessed   Memory --  Reports recent ST memory loss   Observation/Other Assessments   Focus on Therapeutic Outcomes (FOTO)  55%   Observation/Other Assessments-Edema    Edema --  not measured but knee calf and ankle swollen Rt. LE    Sensation   Light Touch Appears Intact   Coordination   Gross Motor Movements are Fluid and Coordinated Not tested   Posture/Postural Control   Posture/Postural Control Postural limitations   Postural Limitations Rounded Shoulders;Forward head   Posture Comments L knee flexed in standing    AROM   Right Knee Extension -8   Right Knee Flexion 108   Left Knee Extension -14   Left Knee Flexion 104   Strength   Right Hip Flexion 3+/5   Right Hip ABduction 3-/5   Left Hip Flexion 3-/5   Left Hip ABduction --  Not due to pain , did not  want to lie on L side   Right Knee Flexion 4+/5   Right Knee Extension 4+/5   Left Knee Flexion 3+/5   Left Knee Extension 4-/5   Right Ankle Dorsiflexion 4/5   Left Ankle Dorsiflexion 4/5   Palpation   Palpation comment min tenderness Rt. hypomobile patella   Ambulation/Gait   Ambulation/Gait Yes   Ambulation/Gait Assistance 6: Modified independent (Device/Increase time)   Ambulation Distance (Feet) 150 Feet   Assistive device 4-wheeled walker   Gait Pattern Step-to pattern;Decreased stride length;Left flexed knee in stance   Ambulation Surface Level;Indoor           PT Education - 01/25/15 1527    Education provided Yes   Education Details PT/POC, HEP and rec time to exercise daily   Person(s) Educated Patient   Methods Explanation;Demonstration;Handout   Comprehension Verbalized understanding;Need further instruction          PT Short Term Goals - 01/25/15 1534    PT SHORT TERM GOAL #1   Title Pt will be be I with HEP for knee AROM and strength   Time 3   Period Weeks   Status New   PT SHORT TERM GOAL #2   Title Pt will be able to report understanding of RICE, body mechanics and daily stretching   Time 3   Period Weeks   Status New   PT SHORT TERM GOAL #3   Title Pt will complete [place] and or TUG for assessment of balance and mobility, set goal    Time 3   Period Weeks   Status New           PT Long Term Goals - 01/25/15 1512    PT LONG TERM GOAL #1   Title Pt will be able to be I with advanced HEP for bilateral knee pain.    Time 6   Period Weeks   Status New   PT LONG TERM GOAL #2   Title Pt will be able to stand in kitchen for light meal prep, dishes for 10 min and report no pain in R. knee.    Time  6   Period Weeks   Status New   PT LONG TERM GOAL #3   Title Pt will be able to use cane occasionally in the home with improved confidence and safety.    Time 6   Period Weeks   Status New   PT LONG TERM GOAL #4   Title Pt will be able to walk for 15-20 min and recover in 30 min (fatigue, pain in knees)   Time 6   Period Weeks   Status New   PT LONG TERM GOAL #5   Title Balance goal to be set.    Time 8   Period Weeks   Status New               Plan - 01/25/15 1529    Clinical Impression Statement This patient presented with eval of high complexity for rehab of Rt. TKR done on 11/15/14.  She is limited now mostly because of co-morbidities and pain/stiffness in L knee, which she hopes to have surgery for this yr.  She initially thought transportation would be a problem but she stated today she is willing to prioritize her therapy needs and will try to do 2 times a week if able. She is very deconditioned and despite weeks of PT, had diffiuclty doing simple ROM and strength ex today.    Pt  will benefit from skilled therapeutic intervention in order to improve on the following deficits Decreased scar mobility;Decreased balance;Decreased mobility;Decreased strength;Increased edema;Postural dysfunction;Improper body mechanics;Impaired flexibility;Pain;Decreased activity tolerance;Increased muscle spasms;Decreased endurance;Cardiopulmonary status limiting activity;Decreased range of motion;Difficulty walking;Increased fascial restricitons;Obesity   Rehab Potential Good   PT Frequency 2x / week  1-2 weeks as able   PT Duration 6 weeks   PT Treatment/Interventions ADLs/Self Care Home Management;Ultrasound;Scar mobilization;DME Instruction;Gait training;Cryotherapy;Electrical Stimulation;Moist Heat;Balance training;Therapeutic exercise;Manual techniques;Manual lymph drainage;Vasopneumatic Device;Therapeutic activities;Functional mobility training;Taping;Patient/family education;Passive range  of motion   PT Next Visit Plan review all level 1 ex, scar mobility and swelling.     PT Home Exercise Plan level 1 knee (x SLR)   Consulted and Agree with Plan of Care Patient          G-Codes - 02-16-15 1523    Functional Assessment Tool Used FOTO   Functional Limitation Mobility: Walking and moving around   Mobility: Walking and Moving Around Current Status [phone]) At least 40 percent but less than 60 percent impaired, limited or restricted   Mobility: Walking and Moving Around Goal Status [phone]) At least 20 percent but less than 40 percent impaired, limited or restricted       Problem List Patient Active Problem List   Diagnosis Date Noted  . Arthritis of knee, degenerative 11/15/2014  . Calf pain 01/21/2014  . Insomnia 12/23/2013  . Leg ulcer, left ([place]) 09/13/2013  . Abnormal SPEP 06/04/2013  . Memory loss 04/27/2013  . Panic attacks 03/17/2013  . Right knee pain 12/29/2012  . Rash and nonspecific skin eruption 10/27/2012  . Dizziness and giddiness 09/07/2012  . Pulmonary nodules 07/20/2012  . CAD in native artery 05/28/2012  . Seborrheic dermatitis 01/20/2012  . Recurrent knee pain 09/03/2011  . Knee pain, left 08/03/2011  . Leg pain, left 07/17/2011  . Polydipsia 05/09/2011  . Diabetes mellitus type 2, uncontrolled ([place]) 05/09/2011  . Abscess of axilla, left 08/10/2010  . Obesity 03/07/2010  . INSOMNIA, CHRONIC 03/07/2010  . NAUSEA 03/07/2010  . BROKEN TOOTH 03/07/2010  . ALLERGIC RHINITIS 10/26/2009  . GRIEF REACTION 08/01/2009  . DYSPNEA 04/28/2009  . MAXILLARY SINUSITIS 10/05/2008  . Essential hypertension 08/27/2007  . B12 deficiency 08/25/2007  . Vitamin D deficiency 08/25/2007  . ELEVATED BP 07/10/2007  . VISUAL CHANGES 07/02/2007  . UTI 02/07/2007  . ABDOMINAL PAIN RIGHT LOWER QUADRANT 02/07/2007  . SINUSITIS, ACUTE 02/02/2007  . Osteoarthritis 02/02/2007  . Sarcoidosis ([place]) 08/14/2006  . Anxiety state 08/14/2006  . Major depressive  disorder, recurrent episode ([place]) 08/14/2006  . GERD 08/14/2006    PAA,JENNIFER 02/16/15, 3:40 PM  [facility] [address] [place], [place], [address] Phone: [phone]   Fax:  [phone]  Name: [name] MRN: [id number] Date of Birth: [birth year] Raeford Razor, PT 02/16/15 3:40 PM Phone: [phone] Fax: [phone]

## 2015-01-26 ENCOUNTER — Other Ambulatory Visit: Payer: Self-pay | Admitting: Internal Medicine

## 2015-01-27 ENCOUNTER — Other Ambulatory Visit: Payer: Self-pay | Admitting: *Deleted

## 2015-01-27 MED ORDER — CARVEDILOL 12.5 MG PO TABS: 12.5000 mg | ORAL_TABLET | Freq: Two times a day (BID) | ORAL | Status: DC

## 2015-02-01 ENCOUNTER — Ambulatory Visit (INDEPENDENT_AMBULATORY_CARE_PROVIDER_SITE_OTHER): Payer: Medicare Other | Admitting: Endocrinology

## 2015-02-01 VITALS — BP 122/80 | HR 103 | Temp 97.8°F | Ht 67.0 in | Wt 237.0 lb

## 2015-02-01 DIAGNOSIS — D62 Acute posthemorrhagic anemia: Secondary | ICD-10-CM

## 2015-02-01 DIAGNOSIS — E1142 Type 2 diabetes mellitus with diabetic polyneuropathy: Secondary | ICD-10-CM

## 2015-02-01 DIAGNOSIS — E1122 Type 2 diabetes mellitus with diabetic chronic kidney disease: Secondary | ICD-10-CM | POA: Insufficient documentation

## 2015-02-01 LAB — POCT GLYCOSYLATED HEMOGLOBIN (HGB A1C): Hemoglobin A1C: 5.8

## 2015-02-01 LAB — CBC WITH DIFFERENTIAL/PLATELET
BASOS ABS: 0 10*3/uL (ref 0.0–0.1)
BASOS PCT: 0.4 % (ref 0.0–3.0)
Eosinophils Absolute: 0.2 10*3/uL (ref 0.0–0.7)
Eosinophils Relative: 2.5 % (ref 0.0–5.0)
HEMATOCRIT: 41.3 % (ref 36.0–46.0)
Hemoglobin: 13.5 g/dL (ref 12.0–15.0)
LYMPHS PCT: 34.6 % (ref 12.0–46.0)
Lymphs Abs: 2.3 10*3/uL (ref 0.7–4.0)
MCHC: 32.6 g/dL (ref 30.0–36.0)
MCV: 92.2 fl (ref 78.0–100.0)
Monocytes Absolute: 0.7 10*3/uL (ref 0.1–1.0)
Monocytes Relative: 10.2 % (ref 3.0–12.0)
NEUTROS PCT: 52.3 % (ref 43.0–77.0)
Neutro Abs: 3.4 10*3/uL (ref 1.4–7.7)
PLATELETS: 297 10*3/uL (ref 150.0–400.0)
RBC: 4.48 Mil/uL (ref 3.87–5.11)
RDW: 14.4 % (ref 11.5–15.5)
WBC: 6.6 10*3/uL (ref 4.0–10.5)

## 2015-02-01 LAB — IBC PANEL
Iron: 107 ug/dL (ref 42–145)
Saturation Ratios: 29.1 % (ref 20.0–50.0)
TRANSFERRIN: 263 mg/dL (ref 212.0–360.0)

## 2015-02-01 LAB — MICROALBUMIN / CREATININE URINE RATIO
Creatinine,U: 91 mg/dL
Microalb Creat Ratio: 0.8 mg/g (ref 0.0–30.0)

## 2015-02-01 NOTE — Progress Notes (Signed)
Subjective:    Patient ID: [name], female    DOB: [birth year], 55 y.o.   MRN: [id number]  HPI Pt returns for f/u of diabetes mellitus: DM type: 2 Dx'ed: 0000000 Complications: polyneuropathy Therapy: metformin. GDM: never DKA: never Severe hypoglycemia: never Pancreatitis: never Other: she took insulin from 2013-2015; She intermittently takes prednisone, for sarcoidosis; she cannot undergo weight-loss surgery, as she has medicaid.   Interval history: since last ov here, she has lost 10 more lbs (for a total of 95), due to her efforts.  No recent prednisone.   she was anemic in the hospital. Past Medical History  Diagnosis Date  . Osteoarthritis   . Sarcoidosis ([place])     Dr. Lolita Patella  . Anxiety   . Vitamin D deficiency     is supposed to take Vit D but can't afford it  . Allergic rhinitis   . ALLERGIC RHINITIS 10/26/2009  . ANXIETY 08/14/2006  . B12 DEFICIENCY 08/25/2007  . VITAMIN D DEFICIENCY 08/25/2007  . Hypertension     takes Coreg daily  . HYPERTENSION 08/27/2007  . DYSPNEA 04/28/2009    with exertion  . Shortness of breath   . Pneumonia     over 30 yrs ago  . Headache     last migraine 2-28yrs ago;takes Topamax daily  . Confusion   . Short-term memory loss   . Long-term memory loss   . TIA (transient ischemic attack)   . Joint pain   . Joint swelling   . Rheumatoid arthritis ([place])   . Osteoarthritis   . Lichen planus   . Insomnia     takes Nortriptyline nightly   . Esophageal reflux     takes Nexium daily  . Depression     takes Lexapro daily  . DEPRESSION 08/14/2006  . Urinary urgency   . Nocturia   . Diabetes mellitus without complication ([place])     was on insulin but has been off since Nov 2015 and now only takes Metformin daily  . OSA (obstructive sleep apnea)     doesn't use CPAP;sleep study in epic from 2006  . History of shingles     Past Surgical History  Procedure Laterality Date  . Arthroscopic knee surgery Right 11-12-04  .  Appendectomy    . Endometrial ablation    . Axillary abcess irrigation and debridement  Jul & Aug2012  . Cyst removed from top of buttocks  at age 7  . Carpal tunnel release Left 05/23/2014    Procedure: CARPAL TUNNEL RELEASE;  Surgeon: Meredith Pel, MD;  Location: [place];  Service: Orthopedics;  Laterality: Left;  . Total knee arthroplasty Right 11/15/2014    Procedure: TOTAL RIGHT KNEE ARTHROPLASTY;  Surgeon: Meredith Pel, MD;  Location: [place];  Service: Orthopedics;  Laterality: Right;    Social History   Social History  . Marital Status: Single    Spouse Name: N/A  . Number of Children: N/A  . Years of Education: N/A   Occupational History  . disabled    Social History Main Topics  . Smoking status: Former Smoker -- 0.00 packs/day    Types: Cigarettes  . Smokeless tobacco: Not on file     Comment: quit smoking in 2004  . Alcohol Use: No  . Drug Use: No  . Sexual Activity: Not Currently   Other Topics Concern  . Not on file   Social History Narrative   Single, broke up with partner in 2008.  Current Outpatient Prescriptions on File Prior to Visit  Medication Sig Dispense Refill  . Blood Glucose Monitoring Suppl (FREESTYLE LITE) DEVI Use to check blood sugar 2 times per day 1 each 0  . carvedilol (COREG) 12.5 MG tablet Take 1 tablet (12.5 mg total) by mouth 2 (two) times daily with a meal. 180 tablet 3  . clobetasol (TEMOVATE) 0.05 % external solution Apply 1 application topically 2 (two) times daily.  3  . escitalopram (LEXAPRO) 20 MG tablet TAKE 1 TABLET BY MOUTH DAILY 90 tablet 3  . glucose blood (FREESTYLE LITE) test strip TEST TWICE DAILY 100 each 3  . KLOR-CON 10 10 MEQ tablet TAKE 1 TABLET BY MOUTH DAILY 90 tablet 3  . Lancets (FREESTYLE) lancets 1 each by Other route 2 (two) times daily. Use as instructed 100 each 12  . metFORMIN (GLUCOPHAGE-XR) 500 MG 24 hr tablet Take 500 mg by mouth daily with breakfast.    . methocarbamol (ROBAXIN) 500 MG tablet  Take 500 mg by mouth 2 (two) times daily.    [name] NEXIUM 40 MG capsule TAKE ONE CAPSULE BY MOUTH EVERY DAY BEFORE BREAKFAST 90 capsule 3  . nortriptyline (PAMELOR) 25 MG capsule TAKE 1-2 CAPSULES BY MOUTH EVERY NIGHT AT BEDTIME AS NEEDED FOR INSOMNIA 180 capsule 0  . oxyCODONE (ROXICODONE) 15 MG immediate release tablet Take 1 tablet (15 mg total) by mouth every 6 (six) hours as needed for pain. 120 tablet 0  . rivaroxaban (XARELTO) 10 MG TABS tablet Take 1 tablet (10 mg total) by mouth daily with breakfast. 14 tablet 0  . topiramate (TOPAMAX) 50 MG tablet TAKE 1 TABLET BY MOUTH TWICE DAILY 180 tablet 2  . [DISCONTINUED] potassium chloride (K-DUR,KLOR-CON) 10 MEQ tablet TAKE 1 TABLET BY MOUTH DAILY 90 tablet 2   No current facility-administered medications on file prior to visit.    Allergies  Allergen Reactions  . Enalapril Maleate     REACTION: cough  . Hydrochlorothiazide     REACTION: Low potassium  . Hydroxychloroquine Sulfate     REACTION: vision changes  . Iodine     Mother was intolerant--CODED on CT table---pt never tired.  . Latex     Hives    Family History  Problem Relation Age of Onset  . Hypertension    . Coronary artery disease      female 1st degree relative <60  . Heart failure      congestive  . Coronary artery disease      Female 1st degree relative <50  . Breast cancer      1st degree relative <50 S  . Heart disease Mother   . Kidney disease Mother   . Cancer Father     leukemia    BP 122/80 mmHg  Pulse 103  Temp(Src) 97.8 F (36.6 C) (Axillary)  Ht 5\' 7"  (1.702 m)  Wt 237 lb (107.502 kg)  BMI 37.11 kg/m2  SpO2 91%  Review of Systems She has intermittent cold intolerance.  Denies BRBPR and hematuria.    Objective:   Physical Exam VITAL SIGNS:  See vs page GENERAL: no distress Pulses: dorsalis pedis intact bilat.   MSK: no deformity of the feet CV: no leg edema Skin:  no ulcer on the feet.  normal color and temp on the feet. Neuro: sensation  is intact to touch on the feet, but slightly decreased from normal.     A1c=5.8%    Assessment & Plan:  DM: well-controlled.  Anemia: new  to me.  Was prob due to operative blood loss.  she requests recheck of cbc today.  She declines colonoscopy. Weight loss. This helps glycemic control.  Pt is advised to continue her efforts.  Patient is advised the following: Patient Instructions  Please continue the same metformin.  blood and urine tests are requested for you today.  We'll let you know about the results. here are some tests for blood in the bowels.  please follow the instructions, and return to the lab. Please let Dr Alain Marion know if you decide to do the colonoscopy, as this reduces your chances of dying of cancer.  Please come back for a follow-up appointment in 6 months.  check your blood sugar once a day.  vary the time of day when you check, between before the 3 meals, and at bedtime.  also check if you have symptoms of your blood sugar being too high or too low.  please keep a record of the readings and bring it to your next appointment here.  You can write it on any piece of paper.  please call us sooner if your blood sugar goes below 70, or if you have a lot of readings over 200.

## 2015-02-01 NOTE — Patient Instructions (Addendum)
Please continue the same metformin.  blood and urine tests are requested for you today.  We'll let you know about the results. here are some tests for blood in the bowels.  please follow the instructions, and return to the lab. Please let Dr Alain Marion know if you decide to do the colonoscopy, as this reduces your chances of dying of cancer.  Please come back for a follow-up appointment in 6 months.  check your blood sugar once a day.  vary the time of day when you check, between before the 3 meals, and at bedtime.  also check if you have symptoms of your blood sugar being too high or too low.  please keep a record of the readings and bring it to your next appointment here.  You can write it on any piece of paper.  please call us sooner if your blood sugar goes below 70, or if you have a lot of readings over 200.

## 2015-02-06 ENCOUNTER — Ambulatory Visit: Payer: Medicare Other | Admitting: Physical Therapy

## 2015-02-08 ENCOUNTER — Ambulatory Visit: Payer: Medicare Other | Admitting: Physical Therapy

## 2015-02-08 DIAGNOSIS — M25562 Pain in left knee: Secondary | ICD-10-CM | POA: Diagnosis not present

## 2015-02-08 DIAGNOSIS — R262 Difficulty in walking, not elsewhere classified: Secondary | ICD-10-CM | POA: Diagnosis not present

## 2015-02-08 DIAGNOSIS — Z7409 Other reduced mobility: Secondary | ICD-10-CM | POA: Diagnosis not present

## 2015-02-08 DIAGNOSIS — R6889 Other general symptoms and signs: Secondary | ICD-10-CM

## 2015-02-08 DIAGNOSIS — R531 Weakness: Secondary | ICD-10-CM

## 2015-02-08 DIAGNOSIS — M25661 Stiffness of right knee, not elsewhere classified: Secondary | ICD-10-CM | POA: Diagnosis not present

## 2015-02-08 DIAGNOSIS — Z96651 Presence of right artificial knee joint: Secondary | ICD-10-CM | POA: Diagnosis not present

## 2015-02-08 DIAGNOSIS — M25561 Pain in right knee: Secondary | ICD-10-CM

## 2015-02-08 NOTE — Therapy (Signed)
[place], [place], [address] Phone: [phone]   Fax:  [phone]  Physical Therapy Treatment  Patient Details  Name: [name] MRN: [id number] Date of Birth: 1960-12-04 Referring Provider: Marlou Sa  Encounter Date: 02/08/2015      PT End of Session - 02/08/15 1046    Visit Number 2   Number of Visits 12   Date for PT Re-Evaluation 03/08/15   PT Start Time 1021   PT Stop Time 1115   PT Time Calculation (min) 54 min   Activity Tolerance Patient tolerated treatment well   Behavior During Therapy [facility] for tasks assessed/performed      Past Medical History  Diagnosis Date  . Osteoarthritis   . Sarcoidosis ([place])     Dr. Lolita Patella  . Anxiety   . Vitamin D deficiency     is supposed to take Vit D but can't afford it  . Allergic rhinitis   . ALLERGIC RHINITIS 10/26/2009  . ANXIETY 08/14/2006  . B12 DEFICIENCY 08/25/2007  . VITAMIN D DEFICIENCY 08/25/2007  . Hypertension     takes Coreg daily  . HYPERTENSION 08/27/2007  . DYSPNEA 04/28/2009    with exertion  . Shortness of breath   . Pneumonia     over 30 yrs ago  . Headache     last migraine 2-27yr ago;takes Topamax daily  . Confusion   . Short-term memory loss   . Long-term memory loss   . TIA (transient ischemic attack)   . Joint pain   . Joint swelling   . Rheumatoid arthritis ([place]   . Osteoarthritis   . Lichen planus   . Insomnia     takes Nortriptyline nightly   . Esophageal reflux     takes Nexium daily  . Depression     takes Lexapro daily  . DEPRESSION 08/14/2006  . Urinary urgency   . Nocturia   . Diabetes mellitus without complication ([place]     was on insulin but has been off since Nov 2015 and now only takes Metformin daily  . OSA (obstructive sleep apnea)     doesn't use CPAP;sleep study in epic from 2006  . History of shingles     Past Surgical History  Procedure Laterality Date  . Arthroscopic knee surgery  Right 11-12-04  . Appendectomy    . Endometrial ablation    . Axillary abcess irrigation and debridement  Jul & Aug2012  . Cyst removed from top of buttocks  at age 89 . Carpal tunnel release Left 05/23/2014    Procedure: CARPAL TUNNEL RELEASE;  Surgeon: SMeredith Pel MD;  [place]  Service: Orthopedics;  Laterality: Left;  . Total knee arthroplasty Right 11/15/2014    Procedure: TOTAL RIGHT KNEE ARTHROPLASTY;  Surgeon: SMeredith Pel MD;  [place]  Service: Orthopedics;  Laterality: Right;    There were no vitals filed for this visit.  Visit Diagnosis:  Status post total right knee replacement  Arthralgia of both knees  Difficulty walking  Knee stiffness, right  Decreased strength, endurance, and mobility      Subjective Assessment - 02/08/15 1035    Subjective I'm sore today, almost fell last week rushing to the bathroom (walker got away from her).     Currently in Pain? Yes   Pain Score 2    Pain Location Knee   Pain Orientation Right   Pain Descriptors / Indicators Tightness;Sore   Pain Type  Chronic pain;Surgical pain   Pain Onset More than a month ago   Pain Frequency Intermittent   Pain Score 9   Pain Location Knee   Pain Orientation Left   Pain Descriptors / Indicators Throbbing   Pain Type Chronic pain   Pain Onset More than a month ago   Pain Frequency Constant       NuStep level 4, 6 min UE and LE for strengthening       [facility] Adult PT Treatment/Exercise - 02/08/15 1044    Knee/Hip Exercises: Stretches   Passive Hamstring Stretch 2 reps;30 seconds   Knee/Hip Exercises: Supine   Quad Sets Strengthening;Both;1 set;20 reps   Short Arc [employer] Strengthening;Both;1 set;20 reps   Short Arc Quad Sets Limitations 4lbs Rt. LE    Heel Slides AAROM;Right;1 set;10 reps   Straight Leg Raises Right;Left;1 set;10 reps   Moist Heat Therapy   Number Minutes Moist Heat 10 Minutes   Moist Heat Location Knee  L   Vasopneumatic   Number  Minutes Vasopneumatic  15 minutes   Vasopnuematic Location  Knee   Vasopneumatic Pressure Low   Vasopneumatic Temperature  med (2 snowflakes)                PT Education - 02/08/15 1045    Education provided Yes   Education Details HEP reissued   Person(s) Educated Patient   Methods Explanation;Handout   Comprehension Verbalized understanding          PT Short Term Goals - 02/08/15 1430    PT SHORT TERM GOAL #1   Title Pt will be be I with HEP for knee AROM and strength   Status On-going   PT SHORT TERM GOAL #2   Title Pt will be able to report understanding of RICE, body mechanics and daily stretching   Status On-going   PT SHORT TERM GOAL #3   Title Pt will complete Berg and or TUG for assessment of balance and mobility, set goal    Status On-going           PT Long Term Goals - 02/08/15 1430    PT LONG TERM GOAL #1   Title Pt will be able to be I with advanced HEP for bilateral knee pain.    Status On-going   PT LONG TERM GOAL #2   Title Pt will be able to stand in kitchen for light meal prep, dishes for 10 min and report no pain in R. knee.    Status On-going   PT LONG TERM GOAL #3   Title Pt will be able to use cane occasionally in the home with improved confidence and safety.    Status On-going   PT LONG TERM GOAL #4   Title Pt will be able to walk for 15-20 min and recover in 30 min (fatigue, pain in knees)   Status On-going   PT LONG TERM GOAL #5   Title Balance goal to be set.    Status On-going               Plan - 02/08/15 1429    Clinical Impression Statement Pt did not do her HEP as given on initial eval, lost her copy.  She is limited with mostly L knee pain but was able to work through the pain and strengthen Level 1 knee on mat.  No goals met, 2 nd visit.    PT Next Visit Plan review all level 1 ex, scar mobility and  swelling.  NuStep, progress as tol BERG   PT Home Exercise Plan level 1 knee (x SLR)   Consulted and Agree with  Plan of Care Patient        Problem List Patient Active Problem List   Diagnosis Date Noted  . Diabetes ([place]) 02/01/2015  . Postoperative anemia due to acute blood loss 02/01/2015  . Arthritis of knee, degenerative 11/15/2014  . Calf pain 01/21/2014  . Insomnia 12/23/2013  . Leg ulcer, left ([place]) 09/13/2013  . Abnormal SPEP 06/04/2013  . Memory loss 04/27/2013  . Panic attacks 03/17/2013  . Right knee pain 12/29/2012  . Rash and nonspecific skin eruption 10/27/2012  . Dizziness and giddiness 09/07/2012  . Pulmonary nodules 07/20/2012  . CAD in native artery 05/28/2012  . Seborrheic dermatitis 01/20/2012  . Recurrent knee pain 09/03/2011  . Knee pain, left 08/03/2011  . Leg pain, left 07/17/2011  . Polydipsia 05/09/2011  . Abscess of axilla, left 08/10/2010  . Obesity 03/07/2010  . INSOMNIA, CHRONIC 03/07/2010  . NAUSEA 03/07/2010  . BROKEN TOOTH 03/07/2010  . ALLERGIC RHINITIS 10/26/2009  . GRIEF REACTION 08/01/2009  . DYSPNEA 04/28/2009  . MAXILLARY SINUSITIS 10/05/2008  . Essential hypertension 08/27/2007  . B12 deficiency 08/25/2007  . Vitamin D deficiency 08/25/2007  . ELEVATED BP 07/10/2007  . VISUAL CHANGES 07/02/2007  . UTI 02/07/2007  . ABDOMINAL PAIN RIGHT LOWER QUADRANT 02/07/2007  . SINUSITIS, ACUTE 02/02/2007  . Osteoarthritis 02/02/2007  . Sarcoidosis ([place]) 08/14/2006  . Anxiety state 08/14/2006  . Major depressive disorder, recurrent episode ([place]) 08/14/2006  . GERD 08/14/2006    [name] 02/08/2015, 3:29 PM  [facility] [address] [place], [place], [address] Phone: [phone]   Fax:  [phone]  Name: [name] MRN: [id number] Date of Birth: Dec 01, 1960   Raeford Razor, PT 02/08/2015 3:30 PM Phone: [phone] Fax: [phone]

## 2015-02-13 ENCOUNTER — Ambulatory Visit: Payer: Medicare Other | Admitting: Physical Therapy

## 2015-02-15 ENCOUNTER — Ambulatory Visit: Payer: Medicare Other | Attending: Orthopedic Surgery | Admitting: Physical Therapy

## 2015-02-15 DIAGNOSIS — R262 Difficulty in walking, not elsewhere classified: Secondary | ICD-10-CM

## 2015-02-15 DIAGNOSIS — R531 Weakness: Secondary | ICD-10-CM

## 2015-02-15 DIAGNOSIS — M25661 Stiffness of right knee, not elsewhere classified: Secondary | ICD-10-CM | POA: Diagnosis not present

## 2015-02-15 DIAGNOSIS — Z96651 Presence of right artificial knee joint: Secondary | ICD-10-CM | POA: Insufficient documentation

## 2015-02-15 DIAGNOSIS — Z7409 Other reduced mobility: Secondary | ICD-10-CM | POA: Diagnosis not present

## 2015-02-15 DIAGNOSIS — R6889 Other general symptoms and signs: Secondary | ICD-10-CM

## 2015-02-15 DIAGNOSIS — M25561 Pain in right knee: Secondary | ICD-10-CM | POA: Diagnosis not present

## 2015-02-15 DIAGNOSIS — M25562 Pain in left knee: Secondary | ICD-10-CM | POA: Insufficient documentation

## 2015-02-15 NOTE — Therapy (Signed)
[place], [place], [address] Phone: [phone]   Fax:  [phone]  Physical Therapy Treatment  Patient Details  Name: [name] MRN: [id number] Date of Birth: 08-11-1960 Referring Provider: Marlou Sa  Encounter Date: 02/15/2015      PT End of Session - 02/15/15 1313    Visit Number 3   Number of Visits 12   Date for PT Re-Evaluation 03/08/15   PT Start Time [id number]   PT Stop Time 1220   PT Time Calculation (min) 45 min      Past Medical History  Diagnosis Date  . Osteoarthritis   . Sarcoidosis ([place])     Dr. Lolita Patella  . Anxiety   . Vitamin D deficiency     is supposed to take Vit D but can't afford it  . Allergic rhinitis   . ALLERGIC RHINITIS 10/26/2009  . ANXIETY 08/14/2006  . B12 DEFICIENCY 08/25/2007  . VITAMIN D DEFICIENCY 08/25/2007  . Hypertension     takes Coreg daily  . HYPERTENSION 08/27/2007  . DYSPNEA 04/28/2009    with exertion  . Shortness of breath   . Pneumonia     over 30 yrs ago  . Headache     last migraine 2-77yrs ago;takes Topamax daily  . Confusion   . Short-term memory loss   . Long-term memory loss   . TIA (transient ischemic attack)   . Joint pain   . Joint swelling   . Rheumatoid arthritis ([place])   . Osteoarthritis   . Lichen planus   . Insomnia     takes Nortriptyline nightly   . Esophageal reflux     takes Nexium daily  . Depression     takes Lexapro daily  . DEPRESSION 08/14/2006  . Urinary urgency   . Nocturia   . Diabetes mellitus without complication ([place])     was on insulin but has been off since Nov 2015 and now only takes Metformin daily  . OSA (obstructive sleep apnea)     doesn't use CPAP;sleep study in epic from 2006  . History of shingles     Past Surgical History  Procedure Laterality Date  . Arthroscopic knee surgery Right 11-12-04  . Appendectomy    . Endometrial ablation    . Axillary abcess irrigation and debridement  Jul &  Aug2012  . Cyst removed from top of buttocks  at age 72  . Carpal tunnel release Left 05/23/2014    Procedure: CARPAL TUNNEL RELEASE;  Surgeon: Meredith Pel, MD;  Location: [place];  Service: Orthopedics;  Laterality: Left;  . Total knee arthroplasty Right 11/15/2014    Procedure: TOTAL RIGHT KNEE ARTHROPLASTY;  Surgeon: Meredith Pel, MD;  Location: [place];  Service: Orthopedics;  Laterality: Right;    There were no vitals filed for this visit.  Visit Diagnosis:  Status post total right knee replacement  Arthralgia of both knees  Difficulty walking  Knee stiffness, right  Decreased strength, endurance, and mobility      Subjective Assessment - 02/15/15 1145    Subjective The knee replacement does not hurt. My left knee is always a 10/10   Currently in Pain? Yes   Pain Score 0-No pain   Pain Location Knee   Pain Orientation Right   Pain Score 10   Pain Location Knee   Pain Orientation Left   Aggravating Factors  its always a 10/10  [facility] Adult PT Treatment/Exercise - 02/15/15 1152    Knee/Hip Exercises: Stretches   Active Hamstring Stretch 3 reps;30 seconds   Active Hamstring Stretch Limitations right- and encouraged her to try left at home on lower stool   Knee/Hip Exercises: Aerobic   Nustep L4 x 10 minutes   Knee/Hip Exercises: Standing   Other Standing Knee Exercises Mini squats at [employer] x10   Knee/Hip Exercises: Seated   Long Arc Quad Both;10 reps;3 sets   [employer] Weight 5 lbs.  on RT only   Knee/Hip Exercises: Supine   Quad Sets Right;1 set;10 reps   Short Arc [employer] Strengthening;3 sets;10 reps   Short Arc Quad Sets Limitations 6lbs  rt only, Arom only on left                PT Education - 02/15/15 1303    Education provided Yes   Education Details Mini Squats at [employer], seated hamstring stretch   Person(s) Educated Patient   Methods Explanation;Handout   Comprehension Verbalized  understanding          PT Short Term Goals - 02/08/15 1430    PT SHORT TERM GOAL #1   Title Pt will be be I with HEP for knee AROM and strength   Status On-going   PT SHORT TERM GOAL #2   Title Pt will be able to report understanding of RICE, body mechanics and daily stretching   Status On-going   PT SHORT TERM GOAL #3   Title Pt will complete Berg and or TUG for assessment of balance and mobility, set goal    Status On-going           PT Long Term Goals - 02/08/15 1430    PT LONG TERM GOAL #1   Title Pt will be able to be I with advanced HEP for bilateral knee pain.    Status On-going   PT LONG TERM GOAL #2   Title Pt will be able to stand in kitchen for light meal prep, dishes for 10 min and report no pain in R. knee.    Status On-going   PT LONG TERM GOAL #3   Title Pt will be able to use cane occasionally in the home with improved confidence and safety.    Status On-going   PT LONG TERM GOAL #4   Title Pt will be able to walk for 15-20 min and recover in 30 min (fatigue, pain in knees)   Status On-going   PT LONG TERM GOAL #5   Title Balance goal to be set.    Status On-going               Plan - 02/15/15 1314    Clinical Impression Statement Pt requires increased time to complete reps/sets. Able to tolerate increased resistance on right without increased pain. Left knee is limited by extension ROM. Added seated hamstring stretch and mini squats to HEP. Pt is limited by non surgical knee pain.    PT Next Visit Plan BERG NEXT VISIT- review all level 1 ex, mini squats, and hamstring stretch, scar mobility and swelling.  NuStep, progress as tol BERG        Problem List Patient Active Problem List   Diagnosis Date Noted  . Diabetes ([place]) 02/01/2015  . Postoperative anemia due to acute blood loss 02/01/2015  . Arthritis of knee, degenerative 11/15/2014  . Calf pain 01/21/2014  . Insomnia 12/23/2013  . Leg ulcer, left ([place]) 09/13/2013  .  Abnormal SPEP  06/04/2013  . Memory loss 04/27/2013  . Panic attacks 03/17/2013  . Right knee pain 12/29/2012  . Rash and nonspecific skin eruption 10/27/2012  . Dizziness and giddiness 09/07/2012  . Pulmonary nodules 07/20/2012  . CAD in native artery 05/28/2012  . Seborrheic dermatitis 01/20/2012  . Recurrent knee pain 09/03/2011  . Knee pain, left 08/03/2011  . Leg pain, left 07/17/2011  . Polydipsia 05/09/2011  . Abscess of axilla, left 08/10/2010  . Obesity 03/07/2010  . INSOMNIA, CHRONIC 03/07/2010  . NAUSEA 03/07/2010  . BROKEN TOOTH 03/07/2010  . ALLERGIC RHINITIS 10/26/2009  . GRIEF REACTION 08/01/2009  . DYSPNEA 04/28/2009  . MAXILLARY SINUSITIS 10/05/2008  . Essential hypertension 08/27/2007  . B12 deficiency 08/25/2007  . Vitamin D deficiency 08/25/2007  . ELEVATED BP 07/10/2007  . VISUAL CHANGES 07/02/2007  . UTI 02/07/2007  . ABDOMINAL PAIN RIGHT LOWER QUADRANT 02/07/2007  . SINUSITIS, ACUTE 02/02/2007  . Osteoarthritis 02/02/2007  . Sarcoidosis ([place]) 08/14/2006  . Anxiety state 08/14/2006  . Major depressive disorder, recurrent episode ([place]) 08/14/2006  . GERD 08/14/2006    Dorene Ar, PTA 02/15/2015, 1:29 PM  [facility] [address] [place], [place], [address] Phone: [phone]   Fax:  [phone]  Name: KOURTNEY KNAPPER MRN: [id number] Date of Birth: 06/15/1960

## 2015-02-15 NOTE — Patient Instructions (Signed)
Leg Extension (Hamstring)   Sit toward front edge of chair, with leg out straight, heel on floor, toes pointing toward body. Keeping back straight, bend forward at hip, Repeat __3_ times. Repeat with other leg. Do _2-3__ sessions per day. Variation: Perform from standing position, with support.  Mini Squat: Double Leg   DO WHILE HOLDING ONTO ROLLATOR WITH CHAIR BEHIND YOU  With feet shoulder width apart, reach forward for balance and do a mini squat. Keep knees in line with second toe. Knees do not go past toes. Repeat __10_ times per set Do __1-2_ sets per session.  http://plyo.exer.us/70   Copyright  VHI. All rights reserved.

## 2015-02-20 ENCOUNTER — Telehealth: Payer: Self-pay | Admitting: Internal Medicine

## 2015-02-20 ENCOUNTER — Encounter: Payer: Medicare Other | Admitting: Physical Therapy

## 2015-02-20 MED ORDER — OXYCODONE HCL 15 MG PO TABS: 15.0000 mg | ORAL_TABLET | Freq: Four times a day (QID) | ORAL | Status: DC | PRN

## 2015-02-20 NOTE — Telephone Encounter (Signed)
OK Oxycod Sch OV for feb Thx

## 2015-02-20 NOTE — Telephone Encounter (Signed)
Is requesting script for oxycodone and morphine

## 2015-02-21 NOTE — Telephone Encounter (Signed)
Pt informed rx is ready for pick. Appt scheduled for Feb: med follow up.

## 2015-02-22 ENCOUNTER — Ambulatory Visit: Payer: Medicare Other | Admitting: Physical Therapy

## 2015-02-22 DIAGNOSIS — M25561 Pain in right knee: Secondary | ICD-10-CM | POA: Diagnosis not present

## 2015-02-22 DIAGNOSIS — Z7409 Other reduced mobility: Secondary | ICD-10-CM | POA: Diagnosis not present

## 2015-02-22 DIAGNOSIS — R262 Difficulty in walking, not elsewhere classified: Secondary | ICD-10-CM

## 2015-02-22 DIAGNOSIS — Z96651 Presence of right artificial knee joint: Secondary | ICD-10-CM

## 2015-02-22 DIAGNOSIS — M25562 Pain in left knee: Secondary | ICD-10-CM | POA: Diagnosis not present

## 2015-02-22 DIAGNOSIS — M25661 Stiffness of right knee, not elsewhere classified: Secondary | ICD-10-CM | POA: Diagnosis not present

## 2015-02-22 NOTE — Therapy (Signed)
[place], [place], [address] Phone: [phone]   Fax:  [phone]  Physical Therapy Treatment  Patient Details  Name: [name] MRN: [id number] Date of Birth: 10-15-1960 Referring Provider: Marlou Sa  Encounter Date: 02/22/2015      PT End of Session - 02/22/15 1214    Visit Number 4   Number of Visits 12   Date for PT Re-Evaluation 03/08/15   PT Start Time 9024   PT Stop Time 1058   PT Time Calculation (min) 43 min   Activity Tolerance Patient tolerated treatment well   Behavior During Therapy [facility] for tasks assessed/performed      Past Medical History  Diagnosis Date  . Osteoarthritis   . Sarcoidosis ([place])     Dr. Lolita Patella  . Anxiety   . Vitamin D deficiency     is supposed to take Vit D but can't afford it  . Allergic rhinitis   . ALLERGIC RHINITIS 10/26/2009  . ANXIETY 08/14/2006  . B12 DEFICIENCY 08/25/2007  . VITAMIN D DEFICIENCY 08/25/2007  . Hypertension     takes Coreg daily  . HYPERTENSION 08/27/2007  . DYSPNEA 04/28/2009    with exertion  . Shortness of breath   . Pneumonia     over 30 yrs ago  . Headache     last migraine 2-74yr ago;takes Topamax daily  . Confusion   . Short-term memory loss   . Long-term memory loss   . TIA (transient ischemic attack)   . Joint pain   . Joint swelling   . Rheumatoid arthritis ([place]   . Osteoarthritis   . Lichen planus   . Insomnia     takes Nortriptyline nightly   . Esophageal reflux     takes Nexium daily  . Depression     takes Lexapro daily  . DEPRESSION 08/14/2006  . Urinary urgency   . Nocturia   . Diabetes mellitus without complication ([place]     was on insulin but has been off since Nov 2015 and now only takes Metformin daily  . OSA (obstructive sleep apnea)     doesn't use CPAP;sleep study in epic from 2006  . History of shingles     Past Surgical History  Procedure Laterality Date  . Arthroscopic knee surgery  Right 11-12-04  . Appendectomy    . Endometrial ablation    . Axillary abcess irrigation and debridement  Jul & Aug2012  . Cyst removed from top of buttocks  at age 55 . Carpal tunnel release Left 05/23/2014    Procedure: CARPAL TUNNEL RELEASE;  Surgeon: SMeredith Pel MD;  [place]  Service: Orthopedics;  Laterality: Left;  . Total knee arthroplasty Right 11/15/2014    Procedure: TOTAL RIGHT KNEE ARTHROPLASTY;  Surgeon: SMeredith Pel MD;  [place]  Service: Orthopedics;  Laterality: Right;    There were no vitals filed for this visit.  Visit Diagnosis:  Difficulty walking  Status post total right knee replacement      Subjective Assessment - 02/22/15 1023    Subjective RT knee sore, LT knee hurts all the time 10/10.  Used cane to go to church on Sunday it did not work out too well,  I was sore the next 2 days.   Currently in Pain? Yes   Pain Score 1    Pain Location Knee   Pain Orientation Right   Pain Descriptors / Indicators Sore   Aggravating  Factors  not moving at night it gets stiff.   Pain Relieving Factors moving around   Multiple Pain Sites Yes   Pain Score 10   Pain Location Knee   Pain Orientation Left   Pain Descriptors / Indicators Throbbing   Pain Type Chronic pain   Pain Frequency Constant   Aggravating Factors  constant   Pain Relieving Factors nothing                         [facility] Adult PT Treatment/Exercise - 02/22/15 0001    Berg Balance Test   Sit to Stand Able to stand using hands after several tries   Standing Unsupported Able to stand 2 minutes with supervision   Sitting with Back Unsupported but Feet Supported on Floor or Stool Able to sit safely and securely 2 minutes   Stand to Sit Controls descent by using hands   Transfers Able to transfer safely, definite need of hands   Standing Unsupported with Eyes Closed Able to stand 10 seconds with supervision   Standing Ubsupported with Feet Together Able to  place feet together independently and stand 1 minute safely   From Standing, Reach Forward with Outstretched Arm Can reach forward >12 cm safely (5")   From Standing Position, Pick up Object from Floor Able to pick up shoe safely and easily   From Standing Position, Turn to Look Behind Over each Shoulder Looks behind one side only/other side shows less weight shift   Turn 360 Degrees Able to turn 360 degrees safely but slowly   Standing Unsupported, Alternately Place Feet on Step/Stool Needs assistance to keep from falling or unable to try   Standing Unsupported, One Foot in [employer] balance while stepping or standing   Standing on One Leg Unable to try or needs assist to prevent fall   Total Score 34   Self-Care   Other Self-Care Comments  Desentization RT knee,  demonstrated and showed how to do and why..                PT Education - 02/22/15 1213    Education provided Yes   Education Details Use walker full time,  how to decrease sensitivity of scar   Person(s) Educated Patient   Methods Explanation   Comprehension Verbalized understanding;Returned demonstration          PT Short Term Goals - 02/22/15 1218    PT SHORT TERM GOAL #1   Title Pt will be be I with HEP for knee AROM and strength   Time 3   Period Weeks   Status Unable to assess   PT SHORT TERM GOAL #2   Title Pt will be able to report understanding of RICE, body mechanics and daily stretching   Time 3   Period Weeks   Status On-going   PT SHORT TERM GOAL #3   Title Pt will complete Berg and or TUG for assessment of balance and mobility, set goal    Baseline BERG 34/56   Time 3   Period Weeks   Status Partially Met           PT Long Term Goals - 02/08/15 1430    PT LONG TERM GOAL #1   Title Pt will be able to be I with advanced HEP for bilateral knee pain.    Status On-going   PT LONG TERM GOAL #2   Title Pt will be able to stand in kitchen  for light meal prep, dishes for 10 min and report  no pain in R. knee.    Status On-going   PT LONG TERM GOAL #3   Title Pt will be able to use cane occasionally in the home with improved confidence and safety.    Status On-going   PT LONG TERM GOAL #4   Title Pt will be able to walk for 15-20 min and recover in 30 min (fatigue, pain in knees)   Status On-going   PT LONG TERM GOAL #5   Title Balance goal to be set.    Status On-going               Plan - 02/22/15 1215    Clinical Impression Statement Most of session spent with BERG.  Patient was tearful at times and was upset she could not do some of the tasks. Emotional support and encougagement given.  She declined the need of modalities for pain .  STG for Balance  partially met.   PT Next Visit Plan Review Level 1 exercises,  stretch and scar mobility,  Nu step.   PT Home Exercise Plan continue   Consulted and Agree with Plan of Care Patient        Problem List Patient Active Problem List   Diagnosis Date Noted  . Diabetes ([place]) 02/01/2015  . Postoperative anemia due to acute blood loss 02/01/2015  . Arthritis of knee, degenerative 11/15/2014  . Calf pain 01/21/2014  . Insomnia 12/23/2013  . Leg ulcer, left ([place]) 09/13/2013  . Abnormal SPEP 06/04/2013  . Memory loss 04/27/2013  . Panic attacks 03/17/2013  . Right knee pain 12/29/2012  . Rash and nonspecific skin eruption 10/27/2012  . Dizziness and giddiness 09/07/2012  . Pulmonary nodules 07/20/2012  . CAD in native artery 05/28/2012  . Seborrheic dermatitis 01/20/2012  . Recurrent knee pain 09/03/2011  . Knee pain, left 08/03/2011  . Leg pain, left 07/17/2011  . Polydipsia 05/09/2011  . Abscess of axilla, left 08/10/2010  . Obesity 03/07/2010  . INSOMNIA, CHRONIC 03/07/2010  . NAUSEA 03/07/2010  . BROKEN TOOTH 03/07/2010  . ALLERGIC RHINITIS 10/26/2009  . GRIEF REACTION 08/01/2009  . DYSPNEA 04/28/2009  . MAXILLARY SINUSITIS 10/05/2008  . Essential hypertension 08/27/2007  . B12 deficiency  08/25/2007  . Vitamin D deficiency 08/25/2007  . ELEVATED BP 07/10/2007  . VISUAL CHANGES 07/02/2007  . UTI 02/07/2007  . ABDOMINAL PAIN RIGHT LOWER QUADRANT 02/07/2007  . SINUSITIS, ACUTE 02/02/2007  . Osteoarthritis 02/02/2007  . Sarcoidosis ([place]) 08/14/2006  . Anxiety state 08/14/2006  . Major depressive disorder, recurrent episode ([place]) 08/14/2006  . GERD 08/14/2006    HARRIS,KAREN 02/22/2015, 12:22 PM  [facility] [address] [place], [place], [address] Phone: [phone]   Fax:  [phone]  Name: [name] MRN: [id number] Date of Birth: [birth year]    Melvenia Needles, PTA 02/22/2015 12:22 PM Phone: [phone] Fax: [phone]

## 2015-02-27 ENCOUNTER — Encounter: Payer: Medicare Other | Admitting: Physical Therapy

## 2015-03-01 ENCOUNTER — Ambulatory Visit: Payer: Medicare Other | Admitting: Physical Therapy

## 2015-03-06 ENCOUNTER — Encounter: Payer: Medicare Other | Admitting: Physical Therapy

## 2015-03-08 ENCOUNTER — Ambulatory Visit: Payer: Medicare Other | Admitting: Physical Therapy

## 2015-03-08 DIAGNOSIS — R262 Difficulty in walking, not elsewhere classified: Secondary | ICD-10-CM | POA: Diagnosis not present

## 2015-03-08 DIAGNOSIS — Z96651 Presence of right artificial knee joint: Secondary | ICD-10-CM

## 2015-03-08 DIAGNOSIS — R6889 Other general symptoms and signs: Secondary | ICD-10-CM

## 2015-03-08 DIAGNOSIS — M25661 Stiffness of right knee, not elsewhere classified: Secondary | ICD-10-CM | POA: Diagnosis not present

## 2015-03-08 DIAGNOSIS — M25561 Pain in right knee: Secondary | ICD-10-CM | POA: Diagnosis not present

## 2015-03-08 DIAGNOSIS — Z7409 Other reduced mobility: Secondary | ICD-10-CM | POA: Diagnosis not present

## 2015-03-08 DIAGNOSIS — R531 Weakness: Secondary | ICD-10-CM

## 2015-03-08 DIAGNOSIS — M25562 Pain in left knee: Secondary | ICD-10-CM

## 2015-03-08 NOTE — Therapy (Signed)
[place], [place], [address] Phone: [phone]   Fax:  [phone]  Physical Therapy Treatment  Patient Details  Name: ALOHA Garrison MRN: [id number] Date of Birth: [birth year] Referring Provider: Marlou Sa  Encounter Date: 03/08/2015      PT End of Session - 03/08/15 1135    Visit Number 5   Number of Visits 12   Date for PT Re-Evaluation 03/08/15   PT Start Time 1018   PT Stop Time 1105   PT Time Calculation (min) 47 min   Activity Tolerance Patient tolerated treatment well   Behavior During Therapy [facility] for tasks assessed/performed      Past Medical History  Diagnosis Date  . Osteoarthritis   . Sarcoidosis ([place])     Dr. Lolita Patella  . Anxiety   . Vitamin D deficiency     is supposed to take Vit D but can't afford it  . Allergic rhinitis   . ALLERGIC RHINITIS 10/26/2009  . ANXIETY 08/14/2006  . B12 DEFICIENCY 08/25/2007  . VITAMIN D DEFICIENCY 08/25/2007  . Hypertension     takes Coreg daily  . HYPERTENSION 08/27/2007  . DYSPNEA 04/28/2009    with exertion  . Shortness of breath   . Pneumonia     over 30 yrs ago  . Headache     last migraine 2-68yr ago;takes Topamax daily  . Confusion   . Short-term memory loss   . Long-term memory loss   . TIA (transient ischemic attack)   . Joint pain   . Joint swelling   . Rheumatoid arthritis ([place]   . Osteoarthritis   . Lichen planus   . Insomnia     takes Nortriptyline nightly   . Esophageal reflux     takes Nexium daily  . Depression     takes Lexapro daily  . DEPRESSION 08/14/2006  . Urinary urgency   . Nocturia   . Diabetes mellitus without complication ([place]     was on insulin but has been off since Nov 2015 and now only takes Metformin daily  . OSA (obstructive sleep apnea)     doesn't use CPAP;sleep study in epic from 2006  . History of shingles     Past Surgical History  Procedure Laterality Date  . Arthroscopic knee surgery  Right 11-12-04  . Appendectomy    . Endometrial ablation    . Axillary abcess irrigation and debridement  Jul & Aug2012  . Cyst removed from top of buttocks  at age 55 . Carpal tunnel release Left 05/23/2014    Procedure: CARPAL TUNNEL RELEASE;  Surgeon: SMeredith Pel MD;  [place]  Service: Orthopedics;  Laterality: Left;  . Total knee arthroplasty Right 11/15/2014    Procedure: TOTAL RIGHT KNEE ARTHROPLASTY;  Surgeon: SMeredith Pel MD;  [place] [facility]  Service: Orthopedics;  Laterality: Right;    There were no vitals filed for this visit.  Visit Diagnosis:  Status post total right knee replacement  Difficulty walking  Arthralgia of both knees  Knee stiffness, right  Decreased strength, endurance, and mobility      Subjective Assessment - 03/08/15 1024    Subjective Her grandchild arrived last week.  That is why she missed last week.  She has been walking a lot of steps.  Scar tissue RT knee less sensitive, I'm able to rub lotion on it now.    Currently in Pain? Yes   Pain Score 8  Pain Location Knee   Pain Orientation Right;Left   Pain Descriptors / Indicators Nagging;Constant   Pain Frequency Intermittent   Aggravating Factors  walking stairs, lingers   Pain Relieving Factors moving around, clay heating pack   Multiple Pain Sites --  LT hand, old carpal tunnel surgery, constant 6/10 achey.             [facility] PT Assessment - 03/08/15 0001    AROM   Right Knee Extension -8   Right Knee Flexion 115  117 AAROM   Left Knee Extension -15   Left Knee Flexion 89  painful.  Measured sitting 105 foot sliding on pillow case                     [facility] Adult PT Treatment/Exercise - 03/08/15 1027    Knee/Hip Exercises: Aerobic   Nustep L5 X 7 minutes   Knee/Hip Exercises: Standing   Forward Step Up Both;1 set;10 reps;Hand Hold: 2;Step Height: 4";Step Height: 6"  6- RT, 4-LT,    LT 9 X until knee cap locked   Knee/Hip Exercises: Seated    Heel Slides AROM;Both;1 set;10 reps   Heel Slides Limitations feet on pillowcase   Knee/Hip Exercises: Supine   Quad Sets Both;1 set;10 reps   Short Arc Advice worker   Short Arc Quad Sets Limitations 6 LBS RT, 2 LBS LT  10 X each 5 seconds   Heel Slides 10 reps;2 sets  painful,  Gentle AA overpressure RT   Patellar Mobs LT>RT  LT was finally able to loosen a little   Manual Therapy   Manual therapy comments insrtument assist patellar tendon LT instrument assist intermittantly.  patellar mobilization.                  PT Education - 03/08/15 1135    Education provided Yes   Education Details how to stretch knees  into extension and flexion   Person(s) Educated Patient   Methods Explanation;Demonstration;Verbal cues   Comprehension Verbalized understanding;Returned demonstration          PT Short Term Goals - 03/08/15 1148    PT SHORT TERM GOAL #1   Title Pt will be be I with HEP for knee AROM and strength   Baseline understands, not yet doing consistantly   Time 3   Period Weeks   Status Partially Met   PT SHORT TERM GOAL #2   Title Pt will be able to report understanding of RICE, body mechanics and daily stretching   Baseline understands need of daily stretching, reviewed today.  Understands RICE but uses heat at home.     Time 3   Period Weeks   Status Achieved   PT SHORT TERM GOAL #3   Title Pt will complete Berg and or TUG for assessment of balance and mobility, set goal    Baseline BERG 34/56  , Need to set goal   Time 3   Period Weeks   Status Partially Met           PT Long Term Goals - 02/08/15 1430    PT LONG TERM GOAL #1   Title Pt will be able to be I with advanced HEP for bilateral knee pain.    Status On-going   PT LONG TERM GOAL #2   Title Pt will be able to stand in kitchen for light meal prep, dishes for 10 min and report no pain in R. knee.  Status On-going   PT LONG TERM GOAL #3   Title Pt will be able to use cane  occasionally in the home with improved confidence and safety.    Status On-going   PT LONG TERM GOAL #4   Title Pt will be able to walk for 15-20 min and recover in 30 min (fatigue, pain in knees)   Status On-going   PT LONG TERM GOAL #5   Title Balance goal to be set.    Status On-going               Plan - 03/08/15 1136    Clinical Impression Statement ROM is gradually improving.  Even tho painful, patient is now able to go up and down stairs and she was not able to do so previously.  Her scar tissue is less sensitive RT knee.  She has not participated consistantly with home exercises with the primary reason: getting a new grandson.  STG#1 partly met,  STG#2 met   PT Next Visit Plan Renewal.  Stretch flexion   PT Home Exercise Plan stretch flexion and extension   Consulted and Agree with Plan of Care Patient        Problem List Patient Active Problem List   Diagnosis Date Noted  . Diabetes ([place]) 02/01/2015  . Postoperative anemia due to acute blood loss 02/01/2015  . Arthritis of knee, degenerative 11/15/2014  . Calf pain 01/21/2014  . Insomnia 12/23/2013  . Leg ulcer, left ([place]) 09/13/2013  . Abnormal SPEP 06/04/2013  . Memory loss 04/27/2013  . Panic attacks 03/17/2013  . Right knee pain 12/29/2012  . Rash and nonspecific skin eruption 10/27/2012  . Dizziness and giddiness 09/07/2012  . Pulmonary nodules 07/20/2012  . CAD in native artery 05/28/2012  . Seborrheic dermatitis 01/20/2012  . Recurrent knee pain 09/03/2011  . Knee pain, left 08/03/2011  . Leg pain, left 07/17/2011  . Polydipsia 05/09/2011  . Abscess of axilla, left 08/10/2010  . Obesity 03/07/2010  . INSOMNIA, CHRONIC 03/07/2010  . NAUSEA 03/07/2010  . BROKEN TOOTH 03/07/2010  . ALLERGIC RHINITIS 10/26/2009  . GRIEF REACTION 08/01/2009  . DYSPNEA 04/28/2009  . MAXILLARY SINUSITIS 10/05/2008  . Essential hypertension 08/27/2007  . B12 deficiency 08/25/2007  . Vitamin D deficiency 08/25/2007   . ELEVATED BP 07/10/2007  . VISUAL CHANGES 07/02/2007  . UTI 02/07/2007  . ABDOMINAL PAIN RIGHT LOWER QUADRANT 02/07/2007  . SINUSITIS, ACUTE 02/02/2007  . Osteoarthritis 02/02/2007  . Sarcoidosis ([place]) 08/14/2006  . Anxiety state 08/14/2006  . Major depressive disorder, recurrent episode ([place]) 08/14/2006  . GERD 08/14/2006    [facility] 03/08/2015, 11:57 AM  [facility] [address] [place], [place], [address] Phone: [phone]   Fax:  [phone]  Name: [name] MRN: [id number] Date of Birth: April 17, 1960    Melvenia Needles, PTA 03/08/2015 11:57 AM Phone: [phone] Fax: [phone]

## 2015-03-08 NOTE — Therapy (Signed)
[place], [place], [address] Phone: [phone]   Fax:  [phone]  Physical Therapy Treatment  Patient Details  Name: [name] MRN: [id number] Date of Birth: [birth year] Referring Provider: Marlou Sa  Encounter Date: 03/08/2015      PT End of Session - 03/08/15 1135    Visit Number 5   Number of Visits 12   Date for PT Re-Evaluation 03/08/15   PT Start Time 1018   PT Stop Time 1105   PT Time Calculation (min) 47 min   Activity Tolerance Patient tolerated treatment well   Behavior During Therapy [facility] for tasks assessed/performed      Past Medical History  Diagnosis Date  . Osteoarthritis   . Sarcoidosis ([place])     Dr. Lolita Patella  . Anxiety   . Vitamin D deficiency     is supposed to take Vit D but can't afford it  . Allergic rhinitis   . ALLERGIC RHINITIS 10/26/2009  . ANXIETY 08/14/2006  . B12 DEFICIENCY 08/25/2007  . VITAMIN D DEFICIENCY 08/25/2007  . Hypertension     takes Coreg daily  . HYPERTENSION 08/27/2007  . DYSPNEA 04/28/2009    with exertion  . Shortness of breath   . Pneumonia     over 30 yrs ago  . Headache     last migraine 2-62yr ago;takes Topamax daily  . Confusion   . Short-term memory loss   . Long-term memory loss   . TIA (transient ischemic attack)   . Joint pain   . Joint swelling   . Rheumatoid arthritis ([place]   . Osteoarthritis   . Lichen planus   . Insomnia     takes Nortriptyline nightly   . Esophageal reflux     takes Nexium daily  . Depression     takes Lexapro daily  . DEPRESSION 08/14/2006  . Urinary urgency   . Nocturia   . Diabetes mellitus without complication ([place]     was on insulin but has been off since Nov 2015 and now only takes Metformin daily  . OSA (obstructive sleep apnea)     doesn't use CPAP;sleep study in epic from 2006  . History of shingles     Past Surgical History  Procedure Laterality Date  . Arthroscopic knee surgery  Right 11-12-04  . Appendectomy    . Endometrial ablation    . Axillary abcess irrigation and debridement  Jul & Aug2012  . Cyst removed from top of buttocks  at age 55 . Carpal tunnel release Left 05/23/2014    Procedure: CARPAL TUNNEL RELEASE;  Surgeon: SMeredith Pel MD;  [place]  Service: Orthopedics;  Laterality: Left;  . Total knee arthroplasty Right 11/15/2014    Procedure: TOTAL RIGHT KNEE ARTHROPLASTY;  Surgeon: SMeredith Pel MD;  [place] [facility]  Service: Orthopedics;  Laterality: Right;    There were no vitals filed for this visit.  Visit Diagnosis:  Status post total right knee replacement  Difficulty walking  Arthralgia of both knees  Knee stiffness, right  Decreased strength, endurance, and mobility      Subjective Assessment - 03/08/15 1024    Subjective Her grandchild arrived last week.  That is why she missed last week.  She has been walking a lot of steps.  Scar tissue RT knee less sensitive, I'm able to rub lotion on it now.    Currently in Pain? Yes   Pain Score 8  Pain Location Knee   Pain Orientation Right;Left   Pain Descriptors / Indicators Nagging;Constant   Pain Frequency Intermittent   Aggravating Factors  walking stairs, lingers   Pain Relieving Factors moving around, clay heating pack   Multiple Pain Sites --  LT hand, old carpal tunnel surgery, constant 6/10 achey.             [facility] PT Assessment - 03/08/15 0001    AROM   Right Knee Extension -8   Right Knee Flexion 115  117 AAROM   Left Knee Extension -15   Left Knee Flexion 89  painful.  Measured sitting 105 foot sliding on pillow case                     [facility] Adult PT Treatment/Exercise - 03/08/15 1027    Knee/Hip Exercises: Aerobic   Nustep L5 X 7 minutes   Knee/Hip Exercises: Standing   Forward Step Up Both;1 set;10 reps;Hand Hold: 2;Step Height: 4";Step Height: 6"  6- RT, 4-LT,    LT 9 X until knee cap locked   Knee/Hip Exercises: Seated    Heel Slides AROM;Both;1 set;10 reps   Heel Slides Limitations feet on pillowcase   Knee/Hip Exercises: Supine   Quad Sets Both;1 set;10 reps   Short Arc Advice worker   Short Arc Quad Sets Limitations 6 LBS RT, 2 LBS LT  10 X each 5 seconds   Heel Slides 10 reps;2 sets  painful,  Gentle AA overpressure RT   Patellar Mobs LT>RT  LT was finally able to loosen a little   Manual Therapy   Manual therapy comments insrtument assist patellar tendon LT instrument assist intermittantly.  patellar mobilization.                  PT Education - 03/08/15 1135    Education provided Yes   Education Details how to stretch knees  into extension and flexion   Person(s) Educated Patient   Methods Explanation;Demonstration;Verbal cues   Comprehension Verbalized understanding;Returned demonstration          PT Short Term Goals - 03/08/15 1148    PT SHORT TERM GOAL #1   Title Pt will be be I with HEP for knee AROM and strength   Baseline understands, not yet doing consistantly   Time 3   Period Weeks   Status Partially Met   PT SHORT TERM GOAL #2   Title Pt will be able to report understanding of RICE, body mechanics and daily stretching   Baseline understands need of daily stretching, reviewed today.  Understands RICE but uses heat at home.     Time 3   Period Weeks   Status Achieved   PT SHORT TERM GOAL #3   Title Pt will complete Berg and or TUG for assessment of balance and mobility, set goal    Baseline BERG 34/56  , Need to set goal   Time 3   Period Weeks   Status Partially Met           PT Long Term Goals - 02/08/15 1430    PT LONG TERM GOAL #1   Title Pt will be able to be I with advanced HEP for bilateral knee pain.    Status On-going   PT LONG TERM GOAL #2   Title Pt will be able to stand in kitchen for light meal prep, dishes for 10 min and report no pain in R. knee.  Status On-going   PT LONG TERM GOAL #3   Title Pt will be able to use cane  occasionally in the home with improved confidence and safety.    Status On-going   PT LONG TERM GOAL #4   Title Pt will be able to walk for 15-20 min and recover in 30 min (fatigue, pain in knees)   Status On-going   PT LONG TERM GOAL #5   Title Balance goal to be set.    Status On-going               Plan - 03/08/15 1136    Clinical Impression Statement ROM is gradually improving.  Even tho painful, patient is now able to go up and down stairs and she was not able to do so previously.  Her scar tissue is less sensitive RT knee.  She has not participated consistantly with home exercises with the primary reason: getting a new grandson.     PT Next Visit Plan Renewal.  Stretch flexion   PT Home Exercise Plan stretch flexion and extension   Consulted and Agree with Plan of Care Patient        Problem List Patient Active Problem List   Diagnosis Date Noted  . Diabetes ([place]) 02/01/2015  . Postoperative anemia due to acute blood loss 02/01/2015  . Arthritis of knee, degenerative 11/15/2014  . Calf pain 01/21/2014  . Insomnia 12/23/2013  . Leg ulcer, left ([place]) 09/13/2013  . Abnormal SPEP 06/04/2013  . Memory loss 04/27/2013  . Panic attacks 03/17/2013  . Right knee pain 12/29/2012  . Rash and nonspecific skin eruption 10/27/2012  . Dizziness and giddiness 09/07/2012  . Pulmonary nodules 07/20/2012  . CAD in native artery 05/28/2012  . Seborrheic dermatitis 01/20/2012  . Recurrent knee pain 09/03/2011  . Knee pain, left 08/03/2011  . Leg pain, left 07/17/2011  . Polydipsia 05/09/2011  . Abscess of axilla, left 08/10/2010  . Obesity 03/07/2010  . INSOMNIA, CHRONIC 03/07/2010  . NAUSEA 03/07/2010  . BROKEN TOOTH 03/07/2010  . ALLERGIC RHINITIS 10/26/2009  . GRIEF REACTION 08/01/2009  . DYSPNEA 04/28/2009  . MAXILLARY SINUSITIS 10/05/2008  . Essential hypertension 08/27/2007  . B12 deficiency 08/25/2007  . Vitamin D deficiency 08/25/2007  . ELEVATED BP 07/10/2007   . VISUAL CHANGES 07/02/2007  . UTI 02/07/2007  . ABDOMINAL PAIN RIGHT LOWER QUADRANT 02/07/2007  . SINUSITIS, ACUTE 02/02/2007  . Osteoarthritis 02/02/2007  . Sarcoidosis ([place]) 08/14/2006  . Anxiety state 08/14/2006  . Major depressive disorder, recurrent episode ([place]) 08/14/2006  . GERD 08/14/2006    [facility] 03/08/2015, 11:54 AM  [facility] [address] [place], [place], [address] Phone: [phone]   Fax:  [phone]  Name: YANIAH THIEMANN MRN: [id number] Date of Birth: [birth year]    Melvenia Needles, PTA 03/08/2015 11:54 AM Phone: [phone] Fax: [phone]

## 2015-03-09 ENCOUNTER — Encounter: Payer: Self-pay | Admitting: Internal Medicine

## 2015-03-09 ENCOUNTER — Ambulatory Visit (INDEPENDENT_AMBULATORY_CARE_PROVIDER_SITE_OTHER): Payer: Medicare Other | Admitting: Internal Medicine

## 2015-03-09 VITALS — BP 110/62 | HR 83 | Wt 236.0 lb

## 2015-03-09 DIAGNOSIS — M170 Bilateral primary osteoarthritis of knee: Secondary | ICD-10-CM

## 2015-03-09 DIAGNOSIS — E1142 Type 2 diabetes mellitus with diabetic polyneuropathy: Secondary | ICD-10-CM

## 2015-03-09 DIAGNOSIS — G4700 Insomnia, unspecified: Secondary | ICD-10-CM | POA: Diagnosis not present

## 2015-03-09 DIAGNOSIS — I10 Essential (primary) hypertension: Secondary | ICD-10-CM

## 2015-03-09 DIAGNOSIS — M25562 Pain in left knee: Secondary | ICD-10-CM | POA: Diagnosis not present

## 2015-03-09 DIAGNOSIS — E538 Deficiency of other specified B group vitamins: Secondary | ICD-10-CM

## 2015-03-09 MED ORDER — NORTRIPTYLINE HCL 25 MG PO CAPS: 50.0000 mg | ORAL_CAPSULE | Freq: Every day | ORAL | Status: DC

## 2015-03-09 MED ORDER — CYANOCOBALAMIN 1000 MCG/ML IJ SOLN
1000.0000 ug | Freq: Once | INTRAMUSCULAR | Status: AC
  Administered 2015-03-09: 1000 ug via INTRAMUSCULAR

## 2015-03-09 NOTE — Assessment & Plan Note (Addendum)
On Roxicodone  Potential benefits of a long term opioids use as well as potential risks (i.e. addiction risk, apnea etc) and complications (i.e. Somnolence, constipation and others) were explained to the patient and were aknowledged. Will ref to Dr Vira Blanco for pain management

## 2015-03-09 NOTE — Assessment & Plan Note (Signed)
NAS diet 

## 2015-03-09 NOTE — Assessment & Plan Note (Signed)
On Metformin Labs 

## 2015-03-09 NOTE — Assessment & Plan Note (Signed)
Insomnia is worse Pamelor - will increase the dose

## 2015-03-09 NOTE — Progress Notes (Signed)
Subjective:  Patient ID: [name], female    DOB: [birth year]  Age: 55 y.o. MRN: [id number]  CC: No chief complaint on file.   HPI CORIAN RUSE presents for chronic pain/OA, DM, HTN, depression f/u. Pt has a new GS (GS DOB 02/28/15) Pt is in PT  Outpatient Prescriptions Prior to Visit  Medication Sig Dispense Refill  . Blood Glucose Monitoring Suppl (FREESTYLE LITE) DEVI Use to check blood sugar 2 times per day 1 each 0  . carvedilol (COREG) 12.5 MG tablet Take 1 tablet (12.5 mg total) by mouth 2 (two) times daily with a meal. 180 tablet 3  . clobetasol (TEMOVATE) 0.05 % external solution Apply 1 application topically 2 (two) times daily.  3  . escitalopram (LEXAPRO) 20 MG tablet TAKE 1 TABLET BY MOUTH DAILY 90 tablet 3  . glucose blood (FREESTYLE LITE) test strip TEST TWICE DAILY 100 each 3  . KLOR-CON 10 10 MEQ tablet TAKE 1 TABLET BY MOUTH DAILY 90 tablet 3  . Lancets (FREESTYLE) lancets 1 each by Other route 2 (two) times daily. Use as instructed 100 each 12  . metFORMIN (GLUCOPHAGE-XR) 500 MG 24 hr tablet Take 500 mg by mouth daily with breakfast.    . methocarbamol (ROBAXIN) 500 MG tablet Take 500 mg by mouth 2 (two) times daily.    [name] NEXIUM 40 MG capsule TAKE ONE CAPSULE BY MOUTH EVERY DAY BEFORE BREAKFAST 90 capsule 3  . oxyCODONE (ROXICODONE) 15 MG immediate release tablet Take 1 tablet (15 mg total) by mouth every 6 (six) hours as needed for pain. 120 tablet 0  . rivaroxaban (XARELTO) 10 MG TABS tablet Take 1 tablet (10 mg total) by mouth daily with breakfast. 14 tablet 0  . topiramate (TOPAMAX) 50 MG tablet TAKE 1 TABLET BY MOUTH TWICE DAILY 180 tablet 2  . nortriptyline (PAMELOR) 25 MG capsule TAKE 1-2 CAPSULES BY MOUTH EVERY NIGHT AT BEDTIME AS NEEDED FOR INSOMNIA 180 capsule 0   No facility-administered medications prior to visit.    ROS Review of Systems  Constitutional: Positive for fatigue. Negative for chills, activity change, appetite change  and unexpected weight change.  HENT: Negative for congestion, mouth sores and sinus pressure.   Eyes: Negative for visual disturbance.  Respiratory: Negative for cough, chest tightness and shortness of breath.   Gastrointestinal: Negative for nausea and abdominal pain.  Genitourinary: Negative for frequency, difficulty urinating and vaginal pain.  Musculoskeletal: Positive for back pain, arthralgias and gait problem.  Skin: Negative for pallor and rash.  Neurological: Negative for dizziness, tremors, weakness, numbness and headaches.  Psychiatric/Behavioral: Negative for suicidal ideas, confusion and sleep disturbance. The patient is not nervous/anxious.     Objective:  BP 110/62 mmHg  Pulse 83  Wt 236 lb (107.049 kg)  SpO2 96%  BP Readings from Last 3 Encounters:  03/09/15 110/62  02/01/15 122/80  12/02/14 118/64    Wt Readings from Last 3 Encounters:  03/09/15 236 lb (107.049 kg)  02/01/15 237 lb (107.502 kg)  12/02/14 252 lb 3.2 oz (114.397 kg)    Physical Exam  Constitutional: She appears well-developed. No distress.  HENT:  Head: Normocephalic.  Right Ear: External ear normal.  Left Ear: External ear normal.  Nose: Nose normal.  Mouth/Throat: Oropharynx is clear and moist.  Eyes: Conjunctivae are normal. Pupils are equal, round, and reactive to light. Right eye exhibits no discharge. Left eye exhibits no discharge.  Neck: Normal range of motion. Neck supple. No JVD present.  No tracheal deviation present. No thyromegaly present.  Cardiovascular: Normal rate, regular rhythm and normal heart sounds.   Pulmonary/Chest: No stridor. No respiratory distress. She has no wheezes.  Abdominal: Soft. Bowel sounds are normal. She exhibits no distension and no mass. There is no tenderness. There is no rebound and no guarding.  Musculoskeletal: She exhibits tenderness. She exhibits no edema.  Lymphadenopathy:    She has no cervical adenopathy.  Neurological: She displays normal  reflexes. No cranial nerve deficit. She exhibits normal muscle tone. Coordination abnormal.  Skin: No rash noted. No erythema.  Psychiatric: She has a normal mood and affect. Her behavior is normal. Judgment and thought content normal.  LS and knees tender Cane   Lab Results  Component Value Date   WBC 6.6 02/01/2015   HGB 13.5 02/01/2015   HCT 41.3 02/01/2015   PLT 297.0 02/01/2015   GLUCOSE 147* 11/16/2014   CHOL 193 06/29/2008   TRIG 145.0 06/29/2008   HDL 67.50 06/29/2008   LDLCALC 97 06/29/2008   ALT 23 03/25/2014   AST 28 03/25/2014   NA 135 11/16/2014   K 4.1 11/16/2014   CL 104 11/16/2014   CREATININE 1.03* 11/16/2014   BUN 11 11/16/2014   CO2 26 11/16/2014   TSH 1.77 06/28/2014   INR 1.01 10/01/2010   HGBA1C 5.8 02/01/2015   MICROALBUR <0.7 02/01/2015    Mm Digital Diagnostic Unilat L  11/11/2014  CLINICAL DATA:  Post biopsy mammogram of the left breast. EXAM: DIAGNOSTIC LEFT MAMMOGRAM POST STEREOTACTIC BIOPSY COMPARISON:  Previous exam(s). FINDINGS: Mammographic images were obtained following stereotactic guided biopsy of calcifications in the upper-outer quadrant of the left breast. The X shaped biopsy marking clip is appropriately positioned at the intended site of biopsy in the left breast. IMPRESSION: Appropriate positioning of the X shaped biopsy marking clip in the upper-outer quadrant of the left breast at the intended site of biopsy. Final Assessment: Post Procedure Mammograms for Marker Placement Electronically Signed   By: Ammie Ferrier M.D.   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 This was found to be concordant by Dr. Ammie Ferrier. Pathology was discussed with the patient by telephone. She reported doing well after the biopsy. Post biopsy instructions  and care were reviewed and her questions were answered. She was encouraged to call [facility] for any additional concerns. She was asked to return for annual screening mammography. Pathology results reported by Susa Raring RN, BSN on November 14, 2014. Electronically Signed   By: Ammie Ferrier M.D.   On: 11/14/2014 15:15  11/16/2014  CLINICAL DATA:  55 year old female presenting for stereotactic biopsy of left breast calcifications. EXAM: LEFT BREAST STEREOTACTIC CORE NEEDLE BIOPSY COMPARISON:  Previous exams. FINDINGS: The patient and I discussed the procedure of stereotactic-guided biopsy including benefits and alternatives. We discussed the high likelihood of a successful procedure. We discussed the risks of the procedure including infection, bleeding, tissue injury, clip migration, and inadequate sampling. Informed written consent was given. The usual time out protocol was performed immediately prior to the procedure. Using sterile technique and 2% Lidocaine as local anesthetic, under stereotactic guidance, a 9 gauge vacuum assisted device was used to perform core needle biopsy of calcifications in the upper-outer quadrant of the left breast using a lateral approach.  Specimen radiograph was performed showing calcifications in several samples. Specimens with calcifications are identified for pathology. At the conclusion of the procedure, a X tissue marker clip was deployed into the biopsy cavity. Follow-up 2-view mammogram was performed and dictated separately. IMPRESSION: Stereotactic-guided biopsy of calcifications in the upper-outer quadrant of the left breast. No apparent complications. Electronically Signed: By: Ammie Ferrier M.D. On: 11/11/2014 12:23    Assessment & Plan:   Diagnoses and all orders for this visit:  Knee pain, left -     Ambulatory referral to Pain Clinic -     Hepatic function panel; Future -     Hemoglobin A1c; Future -     TSH; Future -      CBC with Differential/Platelet; Future -     Basic metabolic panel; Future  Essential hypertension -     Hepatic function panel; Future -     Hemoglobin A1c; Future -     TSH; Future -     CBC with Differential/Platelet; Future -     Basic metabolic panel; Future  Primary osteoarthritis of both knees -     Ambulatory referral to Pain Clinic -     Hepatic function panel; Future -     Hemoglobin A1c; Future -     TSH; Future -     CBC with Differential/Platelet; Future -     Basic metabolic panel; Future  INSOMNIA, CHRONIC -     Hepatic function panel; Future -     Hemoglobin A1c; Future -     TSH; Future -     CBC with Differential/Platelet; Future -     Basic metabolic panel; Future  B12 deficiency -     Hepatic function panel; Future -     Hemoglobin A1c; Future -     TSH; Future -     CBC with Differential/Platelet; Future -     Basic metabolic panel; Future -     cyanocobalamin ((VITAMIN B-12)) injection 1,000 mcg; Inject 1 mL (1,000 mcg total) into the muscle once.  Type 2 diabetes mellitus with diabetic polyneuropathy, without long-term current use of insulin (HCC) -     Hepatic function panel; Future -     Hemoglobin A1c; Future -     TSH; Future -     CBC with Differential/Platelet; Future -     Basic metabolic panel; Future  Other orders -     Cancel: morphine (MS CONTIN) 15 MG 12 hr tablet; Take 1-2 tablets (15-30 mg total) by mouth at bedtime. Please fill on or after 10/09/14 -     Cancel: oxyCODONE (ROXICODONE) 15 MG immediate release tablet; Take 1 tablet (15 mg total) by mouth every 6 (six) hours as needed for pain. -     nortriptyline (PAMELOR) 25 MG capsule; Take 2 capsules (50 mg total) by mouth at bedtime.   I have changed Ms. Willert's nortriptyline. I am also having her maintain her metFORMIN, NEXIUM, clobetasol, escitalopram, glucose blood, freestyle, FREESTYLE LITE, KLOR-CON 10, topiramate, rivaroxaban, methocarbamol, carvedilol, oxyCODONE,  VOLTAREN, and hydroquinone. We administered cyanocobalamin.  Meds ordered this encounter  Medications  . VOLTAREN 1 % GEL    Sig: APPLY 1 GM AA BID    Refill:  2  . hydroquinone 4 % cream    Sig: APPLY TO THE DARK SPOTS ON THE SKIN QHS FOR 3 MONTHS AS DIRECTED    Refill:  0  . nortriptyline (PAMELOR) 25 MG capsule    Sig: Take  2 capsules (50 mg total) by mouth at bedtime.    Dispense:  180 capsule    Refill:  2  . cyanocobalamin ((VITAMIN B-12)) injection 1,000 mcg    Sig:      Follow-up: Return in about 3 months (around 06/06/2015) for a follow-up visit.  Walker Kehr, MD

## 2015-03-09 NOTE — Progress Notes (Signed)
Pre visit review using our clinic review tool, if applicable. No additional management support is needed unless otherwise documented below in the visit note. 

## 2015-03-09 NOTE — Assessment & Plan Note (Signed)
On Roxicodone  Potential benefits of a long term opioids use as well as potential risks (i.e. addiction risk, apnea etc) and complications (i.e. Somnolence, constipation and others) were explained to the patient and were aknowledged. Will ref to Dr Vira Blanco for pain management

## 2015-03-09 NOTE — Assessment & Plan Note (Signed)
On B12 Risks associated with treatment noncompliance were discussed. Compliance was encouraged. IM B12

## 2015-03-22 DIAGNOSIS — Z96651 Presence of right artificial knee joint: Secondary | ICD-10-CM | POA: Diagnosis not present

## 2015-03-23 ENCOUNTER — Telehealth: Payer: Self-pay

## 2015-03-23 MED ORDER — METFORMIN HCL ER 500 MG PO TB24: 1000.0000 mg | ORAL_TABLET | Freq: Every day | ORAL | Status: DC

## 2015-03-23 NOTE — Telephone Encounter (Signed)
Ok.  i increased to 2/day, and i have sent a prescription to your pharmacy

## 2015-03-23 NOTE — Telephone Encounter (Signed)
Pt is requesting a refill on her metformin. The refill request stated the pt is taking 500 mg 2 times daily. The med list stated 500 mg 1 time daily. Please advise which directions are correct. Thanks!

## 2015-03-27 ENCOUNTER — Ambulatory Visit: Payer: Medicare Other | Attending: Orthopedic Surgery | Admitting: Physical Therapy

## 2015-03-27 DIAGNOSIS — R6889 Other general symptoms and signs: Secondary | ICD-10-CM

## 2015-03-27 DIAGNOSIS — R531 Weakness: Secondary | ICD-10-CM

## 2015-03-27 DIAGNOSIS — M25661 Stiffness of right knee, not elsewhere classified: Secondary | ICD-10-CM | POA: Diagnosis not present

## 2015-03-27 DIAGNOSIS — R262 Difficulty in walking, not elsewhere classified: Secondary | ICD-10-CM | POA: Insufficient documentation

## 2015-03-27 DIAGNOSIS — M25561 Pain in right knee: Secondary | ICD-10-CM | POA: Diagnosis not present

## 2015-03-27 DIAGNOSIS — Z7409 Other reduced mobility: Secondary | ICD-10-CM | POA: Diagnosis not present

## 2015-03-27 DIAGNOSIS — Z96651 Presence of right artificial knee joint: Secondary | ICD-10-CM | POA: Insufficient documentation

## 2015-03-27 DIAGNOSIS — M25562 Pain in left knee: Secondary | ICD-10-CM | POA: Diagnosis not present

## 2015-03-27 NOTE — Therapy (Addendum)
[facility] [place], [place], [address] Phone: [phone]   Fax:  [phone]  Physical Therapy Treatment  Patient Details  Name: [name] MRN: [id number] Date of Birth: 1960-04-14 Referring Provider: Marlou Sa  Encounter Date: 03/27/2015      PT End of Session - 03/27/15 1200    Visit Number 6   Number of Visits 24   Date for PT Re-Evaluation 05/22/15   PT Start Time 6168   Activity Tolerance Patient limited by pain   Behavior During Therapy [facility] for tasks assessed/performed      Past Medical History  Diagnosis Date  . Osteoarthritis   . Sarcoidosis ([place])     Dr. Lolita Patella  . Anxiety   . Vitamin D deficiency     is supposed to take Vit D but can't afford it  . Allergic rhinitis   . ALLERGIC RHINITIS 10/26/2009  . ANXIETY 08/14/2006  . B12 DEFICIENCY 08/25/2007  . VITAMIN D DEFICIENCY 08/25/2007  . Hypertension     takes Coreg daily  . HYPERTENSION 08/27/2007  . DYSPNEA 04/28/2009    with exertion  . Shortness of breath   . Pneumonia     over 30 yrs ago  . Headache     last migraine 2-27yr ago;takes Topamax daily  . Confusion   . Short-term memory loss   . Long-term memory loss   . TIA (transient ischemic attack)   . Joint pain   . Joint swelling   . Rheumatoid arthritis ([place]   . Osteoarthritis   . Lichen planus   . Insomnia     takes Nortriptyline nightly   . Esophageal reflux     takes Nexium daily  . Depression     takes Lexapro daily  . DEPRESSION 08/14/2006  . Urinary urgency   . Nocturia   . Diabetes mellitus without complication ([place]     was on insulin but has been off since Nov 2015 and now only takes Metformin daily  . OSA (obstructive sleep apnea)     doesn't use CPAP;sleep study in epic from 2006  . History of shingles     Past Surgical History  Procedure Laterality Date  . Arthroscopic knee surgery Right 11-12-04  . Appendectomy    . Endometrial ablation    .  Axillary abcess irrigation and debridement  Jul & Aug2012  . Cyst removed from top of buttocks  at age 55 . Carpal tunnel release Left 05/23/2014    Procedure: CARPAL TUNNEL RELEASE;  Surgeon: SMeredith Pel MD;  Location: [facility]  Service: Orthopedics;  Laterality: Left;  . Total knee arthroplasty Right 11/15/2014    Procedure: TOTAL RIGHT KNEE ARTHROPLASTY;  Surgeon: SMeredith Pel MD;  Location: [facility]  Service: Orthopedics;  Laterality: Right;    There were no vitals filed for this visit.  Visit Diagnosis:  Status post total right knee replacement  Difficulty walking  Arthralgia of both knees  Knee stiffness, right  Decreased strength, endurance, and mobility      Subjective Assessment - 03/27/15 1156    Subjective Lapse in POC due to no appt times avail.  I fell yesterday trying to walk without a cane.  Sore today. I fell on my knees. My sarcoidosis is acting up again.    Currently in Pain? Yes   Pain Score 8    Pain Orientation Right;Left   Pain Descriptors / Indicators Sore   Pain Type Chronic pain  Pain Onset More than a month ago   Pain Frequency Constant   Aggravating Factors  walking, walking up stairs, sitting still too long   Pain Relieving Factors nothing really, L knee Voltaren, hot pack, rest, no help from meds            [facility] PT Assessment - 03/27/15 1203    Assessment   Medical Diagnosis Rt TKR   Onset Date/Surgical Date 11/15/14   Prior Therapy Yes prior to and after surgery   Precautions   Precautions None   Restrictions   Weight Bearing Restrictions No   Home Environment   Living Environment Private residence   Living Arrangements Alone   Available Help at Discharge Personal care attendant   Type of [place] Access Level entry   Home Layout One level   [place] - 2 wheels;Walker - 4 wheels;Cane - quad;Bedside commode;Shower seat;Grab bars - tub/shower   Prior Function   Level of Independence Independent with  household mobility with device;Independent with community mobility with device;Independent with gait;Needs assistance with homemaking   Cognition   Overall Cognitive Status Within Functional Limits for tasks assessed   AROM   Right Knee Extension -5   Right Knee Flexion 110   Left Knee Extension -13   Left Knee Flexion 100   Strength   Right Hip Flexion 4/5   Right Hip ABduction 3/5   Left Hip Flexion 3+/5   Left Hip ABduction 3-/5  pain to lie on R.t    Right Knee Flexion 4+/5   Right Knee Extension 4+/5   Left Knee Flexion 3+/5   Left Knee Extension 3+/5   Right Ankle Dorsiflexion 4+/5   Left Ankle Dorsiflexion 4/5                     [facility] Adult PT Treatment/Exercise - 03/27/15 1250    Self-Care   Other Self-Care Comments  HEP, ice vs heat, using cane and hip abduction   Knee/Hip Exercises: Stretches   Active Hamstring Stretch Right;Left;2 reps   Active Hamstring Stretch Limitations L side too challengingm did not feel in post knee   Passive Hamstring Stretch Left;1 rep;30 seconds   Knee/Hip Exercises: Aerobic   Nustep L5 X 7 minutes   Knee/Hip Exercises: Seated   Heel Slides AAROM;Both;1 set;10 reps   Knee/Hip Exercises: Supine   Quad Sets Strengthening;Both;1 set;10 reps   Knee/Hip Exercises: Sidelying   Hip ABduction Strengthening;Right;1 set;10 reps   Clams bilat. x 10 reps pillow betwen kness to reduce pain    Moist Heat Therapy   Number Minutes Moist Heat 10 Minutes   Moist Heat Location Knee  R   Cryotherapy   Number Minutes Cryotherapy 10 Minutes   Cryotherapy Location Knee  L   Type of Cryotherapy Ice pack                PT Education - 03/27/15 1214    Education provided Yes   Education Details AAROM, HEP reinforcement   Person(s) Educated Patient   Methods Explanation   Comprehension Verbalized understanding          PT Short Term Goals - 03/27/15 1215    PT SHORT TERM GOAL #1   Title Pt will be be I with HEP for knee  AROM and strength   Status Partially Met   PT SHORT TERM GOAL #2   Title Pt will be able to report understanding of RICE, body mechanics  and daily stretching   Status Achieved   PT SHORT TERM GOAL #3   Title Pt will complete Berg and or TUG for assessment of balance and mobility, set goal    Status Partially Met           PT Long Term Goals - 04-10-2015 1215    PT LONG TERM GOAL #1   Title Pt will be able to be I with advanced HEP for bilateral knee pain.    Status On-going   PT LONG TERM GOAL #2   Title Pt will be able to stand in kitchen for light meal prep, dishes for 10 min and report no pain in R. knee.    Status Achieved   PT LONG TERM GOAL #3   Title Pt will be able to use cane occasionally in the home with improved confidence and safety.    Status On-going   PT LONG TERM GOAL #4   Title Pt will be able to walk for 15-20 min and recover in 30 min (fatigue, pain in knees)   Status On-going   PT LONG TERM GOAL #5   Title Balance will improve by 6 pts to decrease risk of falling.    Status New   Additional Long Term Goals   Additional Long Term Goals Yes   PT LONG TERM GOAL #6   Title FOTO score will improve to 45% or less impaired    Time 8   Period Weeks   Status New               Plan - 2015-04-10 1230    Clinical Impression Statement Patient returns from previous episode, cont to have pain and limitation in bilateral knees, balance difficulties.  She reports doing her HEP regularly.  She is limited mostly in L knee pain and AROM, although she ha not digressed back too mich from 2-3 weeks ago.  She would like to postpone her L TKR until August.  She also has decreased mobility due to sarcoidosis.     Pt will benefit from skilled therapeutic intervention in order to improve on the following deficits Decreased scar mobility;Decreased balance;Decreased mobility;Decreased strength;Increased edema;Postural dysfunction;Improper body mechanics;Impaired  flexibility;Pain;Decreased activity tolerance;Increased muscle spasms;Decreased endurance;Cardiopulmonary status limiting activity;Decreased range of motion;Difficulty walking;Increased fascial restricitons;Obesity   Rehab Potential Good   PT Frequency 2x / week   PT Duration 8 weeks   PT Treatment/Interventions ADLs/Self Care Home Management;Ultrasound;Scar mobilization;DME Instruction;Gait training;Cryotherapy;Electrical Stimulation;Moist Heat;Balance training;Therapeutic exercise;Manual techniques;Manual lymph drainage;Vasopneumatic Device;Therapeutic activities;Functional mobility training;Taping;Patient/family education;Passive range of motion   PT Next Visit Plan AROM, strength, needs hip abd strength    PT Home Exercise Plan stretch flexion and extension, level 1 knee and mini squats    Consulted and Agree with Plan of Care Patient          G-Codes - Apr 10, 2015 1325    Functional Assessment Tool Used FOTO   Functional Limitation Mobility: Walking and moving around   Mobility: Walking and Moving Around Current Status [phone]) At least 40 percent but less than 60 percent impaired, limited or restricted   Mobility: Walking and Moving Around Goal Status [phone]) At least 40 percent but less than 60 percent impaired, limited or restricted      Problem List Patient Active Problem List   Diagnosis Date Noted  . Diabetes ([place]) 02/01/2015  . Postoperative anemia due to acute blood loss 02/01/2015  . Arthritis of knee, degenerative 11/15/2014  . Calf pain 01/21/2014  . Insomnia 12/23/2013  .  Leg ulcer, left ([place]) 09/13/2013  . Abnormal SPEP 06/04/2013  . Memory loss 04/27/2013  . Panic attacks 03/17/2013  . Right knee pain 12/29/2012  . Rash and nonspecific skin eruption 10/27/2012  . Dizziness and giddiness 09/07/2012  . Pulmonary nodules 07/20/2012  . CAD in native artery 05/28/2012  . Seborrheic dermatitis 01/20/2012  . Recurrent knee pain 09/03/2011  . Knee pain, left 08/03/2011   . Leg pain, left 07/17/2011  . Polydipsia 05/09/2011  . Abscess of axilla, left 08/10/2010  . Obesity 03/07/2010  . INSOMNIA, CHRONIC 03/07/2010  . NAUSEA 03/07/2010  . BROKEN TOOTH 03/07/2010  . ALLERGIC RHINITIS 10/26/2009  . GRIEF REACTION 08/01/2009  . DYSPNEA 04/28/2009  . MAXILLARY SINUSITIS 10/05/2008  . Essential hypertension 08/27/2007  . B12 deficiency 08/25/2007  . Vitamin D deficiency 08/25/2007  . ELEVATED BP 07/10/2007  . VISUAL CHANGES 07/02/2007  . UTI 02/07/2007  . ABDOMINAL PAIN RIGHT LOWER QUADRANT 02/07/2007  . SINUSITIS, ACUTE 02/02/2007  . Osteoarthritis 02/02/2007  . Sarcoidosis ([place]) 08/14/2006  . Anxiety state 08/14/2006  . Major depressive disorder, recurrent episode ([place]) 08/14/2006  . GERD 08/14/2006    PAA,JENNIFER 03/27/2015, 1:25 PM  [facility] [address] [place], [place], [address] Phone: [phone]   Fax:  [phone]  Name: PRECILLA PURNELL MRN: [id number] Date of Birth: [birth year]    Raeford Razor, PT 03/27/2015 1:25 PM Phone: [phone] Fax: [phone]

## 2015-04-03 ENCOUNTER — Ambulatory Visit: Payer: Medicare Other | Admitting: Physical Therapy

## 2015-04-03 ENCOUNTER — Other Ambulatory Visit (INDEPENDENT_AMBULATORY_CARE_PROVIDER_SITE_OTHER): Payer: Medicare Other

## 2015-04-03 ENCOUNTER — Other Ambulatory Visit: Payer: Self-pay | Admitting: Internal Medicine

## 2015-04-03 DIAGNOSIS — I10 Essential (primary) hypertension: Secondary | ICD-10-CM

## 2015-04-03 DIAGNOSIS — Z7409 Other reduced mobility: Secondary | ICD-10-CM

## 2015-04-03 DIAGNOSIS — E1142 Type 2 diabetes mellitus with diabetic polyneuropathy: Secondary | ICD-10-CM | POA: Diagnosis not present

## 2015-04-03 DIAGNOSIS — M25661 Stiffness of right knee, not elsewhere classified: Secondary | ICD-10-CM

## 2015-04-03 DIAGNOSIS — M170 Bilateral primary osteoarthritis of knee: Secondary | ICD-10-CM | POA: Diagnosis not present

## 2015-04-03 DIAGNOSIS — Z96651 Presence of right artificial knee joint: Secondary | ICD-10-CM

## 2015-04-03 DIAGNOSIS — G4700 Insomnia, unspecified: Secondary | ICD-10-CM | POA: Diagnosis not present

## 2015-04-03 DIAGNOSIS — M25561 Pain in right knee: Secondary | ICD-10-CM | POA: Diagnosis not present

## 2015-04-03 DIAGNOSIS — R531 Weakness: Secondary | ICD-10-CM

## 2015-04-03 DIAGNOSIS — M25562 Pain in left knee: Secondary | ICD-10-CM

## 2015-04-03 DIAGNOSIS — R262 Difficulty in walking, not elsewhere classified: Secondary | ICD-10-CM | POA: Diagnosis not present

## 2015-04-03 DIAGNOSIS — E538 Deficiency of other specified B group vitamins: Secondary | ICD-10-CM

## 2015-04-03 LAB — CBC WITH DIFFERENTIAL/PLATELET
BASOS PCT: 0.5 % (ref 0.0–3.0)
Basophils Absolute: 0 10*3/uL (ref 0.0–0.1)
Eosinophils Absolute: 0.2 10*3/uL (ref 0.0–0.7)
Eosinophils Relative: 2.5 % (ref 0.0–5.0)
HEMATOCRIT: 39.8 % (ref 36.0–46.0)
HEMOGLOBIN: 13.4 g/dL (ref 12.0–15.0)
LYMPHS ABS: 2.1 10*3/uL (ref 0.7–4.0)
LYMPHS PCT: 28.3 % (ref 12.0–46.0)
MCHC: 33.5 g/dL (ref 30.0–36.0)
MCV: 92.5 fl (ref 78.0–100.0)
MONOS PCT: 9.5 % (ref 3.0–12.0)
Monocytes Absolute: 0.7 10*3/uL (ref 0.1–1.0)
NEUTROS ABS: 4.3 10*3/uL (ref 1.4–7.7)
Neutrophils Relative %: 59.2 % (ref 43.0–77.0)
PLATELETS: 265 10*3/uL (ref 150.0–400.0)
RBC: 4.31 Mil/uL (ref 3.87–5.11)
RDW: 14.2 % (ref 11.5–15.5)
WBC: 7.3 10*3/uL (ref 4.0–10.5)

## 2015-04-03 LAB — HEPATIC FUNCTION PANEL
ALBUMIN: 3.9 g/dL (ref 3.5–5.2)
ALK PHOS: 122 U/L — AB (ref 39–117)
ALT: 35 U/L (ref 0–35)
AST: 31 U/L (ref 0–37)
Bilirubin, Direct: 0 mg/dL (ref 0.0–0.3)
Total Bilirubin: 0.3 mg/dL (ref 0.2–1.2)
Total Protein: 7.5 g/dL (ref 6.0–8.3)

## 2015-04-03 LAB — BASIC METABOLIC PANEL
BUN: 22 mg/dL (ref 6–23)
CHLORIDE: 103 meq/L (ref 96–112)
CO2: 27 mEq/L (ref 19–32)
CREATININE: 1.08 mg/dL (ref 0.40–1.20)
Calcium: 9.6 mg/dL (ref 8.4–10.5)
GFR: 56.05 mL/min — AB (ref >60.00)
GLUCOSE: 112 mg/dL — AB (ref 70–99)
Potassium: 4.1 mEq/L (ref 3.5–5.1)
Sodium: 140 mEq/L (ref 135–145)

## 2015-04-03 LAB — HEMOGLOBIN A1C: HEMOGLOBIN A1C: 6 % (ref 4.6–6.5)

## 2015-04-03 LAB — TSH: TSH: 1.54 u[IU]/mL (ref 0.35–4.50)

## 2015-04-03 NOTE — Therapy (Addendum)
[place], [place], [address] Phone: [phone]   Fax:  [phone]  Physical Therapy Treatment/Discharge  Patient Details  Name: [name] MRN: [id number] Date of Birth: September 06, 1960 Referring Provider: Marlou Sa  Encounter Date: 04/03/2015      PT End of Session - 04/03/15 1527    Visit Number 7   Number of Visits 24   Date for PT Re-Evaluation 05/22/15   PT Start Time 0210   PT Stop Time 0258   PT Time Calculation (min) 48 min      Past Medical History  Diagnosis Date  . Osteoarthritis   . Sarcoidosis ([place])     Dr. Lolita Patella  . Anxiety   . Vitamin D deficiency     is supposed to take Vit D but can't afford it  . Allergic rhinitis   . ALLERGIC RHINITIS 10/26/2009  . ANXIETY 08/14/2006  . B12 DEFICIENCY 08/25/2007  . VITAMIN D DEFICIENCY 08/25/2007  . Hypertension     takes Coreg daily  . HYPERTENSION 08/27/2007  . DYSPNEA 04/28/2009    with exertion  . Shortness of breath   . Pneumonia     over 30 yrs ago  . Headache     last migraine 2-25yr ago;takes Topamax daily  . Confusion   . Short-term memory loss   . Long-term memory loss   . TIA (transient ischemic attack)   . Joint pain   . Joint swelling   . Rheumatoid arthritis ([place]   . Osteoarthritis   . Lichen planus   . Insomnia     takes Nortriptyline nightly   . Esophageal reflux     takes Nexium daily  . Depression     takes Lexapro daily  . DEPRESSION 08/14/2006  . Urinary urgency   . Nocturia   . Diabetes mellitus without complication ([place]     was on insulin but has been off since Nov 2015 and now only takes Metformin daily  . OSA (obstructive sleep apnea)     doesn't use CPAP;sleep study in epic from 2006  . History of shingles     Past Surgical History  Procedure Laterality Date  . Arthroscopic knee surgery Right 11-12-04  . Appendectomy    . Endometrial ablation    . Axillary abcess irrigation and  debridement  Jul & Aug2012  . Cyst removed from top of buttocks  at age 55 . Carpal tunnel release Left 05/23/2014    Procedure: CARPAL TUNNEL RELEASE;  Surgeon: SMeredith Pel MD;  [facility]  Service: Orthopedics;  Laterality: Left;  . Total knee arthroplasty Right 11/15/2014    Procedure: TOTAL RIGHT KNEE ARTHROPLASTY;  Surgeon: SMeredith Pel MD;  [facility]  Service: Orthopedics;  Laterality: Right;    There were no vitals filed for this visit.  Visit Diagnosis:  Status post total right knee replacement  Difficulty walking  Arthralgia of both knees  Knee stiffness, right  Decreased strength, endurance, and mobility                       [facility] Adult PT Treatment/Exercise - 04/03/15 0001    Knee/Hip Exercises: Stretches   Active Hamstring Stretch Right;Left;2 reps   Active Hamstring Stretch Limitations seated with foot on floor   Knee/Hip Exercises: Aerobic   Nustep L5 X 6 minutes   Knee/Hip Exercises: Seated   Long Arc Quad AROM   Long Arc Quad Limitations  left AROM, right    Heel Slides AROM   Heel Slides Limitations 2 sets left with foot on pillow case, also TKE with extension on pillow case   Hamstring Curl 1 set;10 reps   Hamstring Limitations green band right, attempted red band left -too painful   Abd/Adduction Limitations Seated green band clams   Knee/Hip Exercises: Supine   Bridges Limitations x10   Other Supine Knee/Hip Exercises hooklying green band clam 10 x 2   Knee/Hip Exercises: Sidelying   Hip ABduction Strengthening;Right;1 set;10 reps   Clams bilateral 10  x 2 each                  PT Short Term Goals - 03/27/15 1215    PT SHORT TERM GOAL #1   Title Pt will be be I with HEP for knee AROM and strength   Status Partially Met   PT SHORT TERM GOAL #2   Title Pt will be able to report understanding of RICE, body mechanics and daily stretching   Status Achieved   PT SHORT TERM GOAL #3   Title Pt will  complete Berg and or TUG for assessment of balance and mobility, set goal    Status Partially Met           PT Long Term Goals - 03/27/15 1215    PT LONG TERM GOAL #1   Title Pt will be able to be I with advanced HEP for bilateral knee pain.    Status On-going   PT LONG TERM GOAL #2   Title Pt will be able to stand in kitchen for light meal prep, dishes for 10 min and report no pain in R. knee.    Status Achieved   PT LONG TERM GOAL #3   Title Pt will be able to use cane occasionally in the home with improved confidence and safety.    Status On-going   PT LONG TERM GOAL #4   Title Pt will be able to walk for 15-20 min and recover in 30 min (fatigue, pain in knees)   Status On-going   PT LONG TERM GOAL #5   Title Balance will improve by 6 pts to decrease risk of falling.    Status New   Additional Long Term Goals   Additional Long Term Goals Yes   PT LONG TERM GOAL #6   Title FOTO score will improve to 45% or less impaired    Time 8   Period Weeks   Status New               Plan - 04/03/15 1646    Clinical Impression Statement Pt reports she has called her surgeon and is having her left knee replaced as soon as possible. Left knee slows her progress with right knee rehab. Performed hip abduction stretngthening in sidelying with pt c/o increased pain and that she will be unable to get out of bed tomorrow due to performing strenuous exercises. Reduced hip abduction exercise from sidelying to supine to seated with green band and issued green band for home seated clams. Pt declined modalities due to her transportation waiting on her.    PT Next Visit Plan AROM, strength, needs hip abd strength, review seated clams, weights for RLE, AROM  and gentle stretches for LLE         Problem List Patient Active Problem List   Diagnosis Date Noted  . Diabetes ([place]) 02/01/2015  . Postoperative anemia due to acute blood  loss 02/01/2015  . Arthritis of knee, degenerative 11/15/2014   . Calf pain 01/21/2014  . Insomnia 12/23/2013  . Leg ulcer, left ([place]) 09/13/2013  . Abnormal SPEP 06/04/2013  . Memory loss 04/27/2013  . Panic attacks 03/17/2013  . Right knee pain 12/29/2012  . Rash and nonspecific skin eruption 10/27/2012  . Dizziness and giddiness 09/07/2012  . Pulmonary nodules 07/20/2012  . CAD in native artery 05/28/2012  . Seborrheic dermatitis 01/20/2012  . Recurrent knee pain 09/03/2011  . Knee pain, left 08/03/2011  . Leg pain, left 07/17/2011  . Polydipsia 05/09/2011  . Abscess of axilla, left 08/10/2010  . Obesity 03/07/2010  . INSOMNIA, CHRONIC 03/07/2010  . NAUSEA 03/07/2010  . BROKEN TOOTH 03/07/2010  . ALLERGIC RHINITIS 10/26/2009  . GRIEF REACTION 08/01/2009  . DYSPNEA 04/28/2009  . MAXILLARY SINUSITIS 10/05/2008  . Essential hypertension 08/27/2007  . B12 deficiency 08/25/2007  . Vitamin D deficiency 08/25/2007  . ELEVATED BP 07/10/2007  . VISUAL CHANGES 07/02/2007  . UTI 02/07/2007  . ABDOMINAL PAIN RIGHT LOWER QUADRANT 02/07/2007  . SINUSITIS, ACUTE 02/02/2007  . Osteoarthritis 02/02/2007  . Sarcoidosis ([place]) 08/14/2006  . Anxiety state 08/14/2006  . Major depressive disorder, recurrent episode ([place]) 08/14/2006  . GERD 08/14/2006    Dorene Ar, PTA 04/03/2015, 4:51 PM  [facility] [address] [place], [place], [address] Phone: [phone]   Fax:  [phone]  Name: [name] MRN: [id number] Date of Birth: December 26, 1960  PHYSICAL THERAPY DISCHARGE SUMMARY  Visits from Start of Care: 7 Current functional level related to goals / functional outcomes: See above for most current status    Remaining deficits: Unknown see above    Education / Equipment: HEP, RICE, gait, posture  Plan: Patient agrees to discharge.  Patient goals were partially met. Patient is being discharged due to not returning since the last visit.  ?????     Raeford Razor,  PT 10/25/15 2:06 PM Phone: [phone] Fax: [phone]

## 2015-04-03 NOTE — Telephone Encounter (Signed)
Pt needs refill of 15 mg oxycodone, please call pt if any questions or concerns arise. MS

## 2015-04-03 NOTE — Telephone Encounter (Signed)
Ok to fill - pls print Thx

## 2015-04-03 NOTE — Patient Instructions (Signed)
Perform Seated Clam shell with green band around knees Open and hold 5 seconds, slowly return. Repeat 2 sets of 10 reps, 2 x per day.

## 2015-04-04 NOTE — Telephone Encounter (Signed)
Will close encounter...[name]

## 2015-04-04 NOTE — Telephone Encounter (Signed)
Pt called back stated she is going to the pain clinic on Thursday and do not need this rx ( she hopping they will take care of it).

## 2015-04-05 ENCOUNTER — Encounter: Payer: Medicare Other | Admitting: Physical Therapy

## 2015-04-06 DIAGNOSIS — G894 Chronic pain syndrome: Secondary | ICD-10-CM | POA: Diagnosis not present

## 2015-04-06 DIAGNOSIS — Z79899 Other long term (current) drug therapy: Secondary | ICD-10-CM | POA: Diagnosis not present

## 2015-04-06 DIAGNOSIS — M79643 Pain in unspecified hand: Secondary | ICD-10-CM | POA: Diagnosis not present

## 2015-04-06 DIAGNOSIS — E669 Obesity, unspecified: Secondary | ICD-10-CM | POA: Diagnosis not present

## 2015-04-06 DIAGNOSIS — M25569 Pain in unspecified knee: Secondary | ICD-10-CM | POA: Diagnosis not present

## 2015-04-10 ENCOUNTER — Encounter: Payer: Medicare Other | Admitting: Physical Therapy

## 2015-04-11 ENCOUNTER — Telehealth: Payer: Self-pay | Admitting: Endocrinology

## 2015-04-11 ENCOUNTER — Telehealth: Payer: Self-pay | Admitting: Internal Medicine

## 2015-04-11 NOTE — Telephone Encounter (Signed)
Pt is upset that she is upset about being sent back to the pain clinic. They gave her Lyrica 75 mg and Morphine 50 mg. She had both filled but did not start Morphine because the Lyrica is making her "high" and is messing with her stomach. She states she has fallen even using her walker.  She wants you you to prescribe her meds. She states the MD at the pain clinic is arrogant. She is going back tomorrow to the pain clinic to discuss this.   Please advise.

## 2015-04-11 NOTE — Telephone Encounter (Signed)
See note below and please advise which directions are correct. Thanks!

## 2015-04-11 NOTE — Telephone Encounter (Signed)
Pt called request to speak to the assistant concern about 1 of the medcation that she is taking. Please call her back

## 2015-04-11 NOTE — Telephone Encounter (Signed)
Patient would like to know are the instruction on her medication metformin correct, [name] has her taking two 500 mg pills twice a day when she usually take one a day. Please advise

## 2015-04-12 ENCOUNTER — Encounter: Payer: Medicare Other | Admitting: Physical Therapy

## 2015-04-12 DIAGNOSIS — Z79899 Other long term (current) drug therapy: Secondary | ICD-10-CM | POA: Diagnosis not present

## 2015-04-12 DIAGNOSIS — M79643 Pain in unspecified hand: Secondary | ICD-10-CM | POA: Diagnosis not present

## 2015-04-12 DIAGNOSIS — E669 Obesity, unspecified: Secondary | ICD-10-CM | POA: Diagnosis not present

## 2015-04-12 DIAGNOSIS — Z79891 Long term (current) use of opiate analgesic: Secondary | ICD-10-CM | POA: Diagnosis not present

## 2015-04-12 DIAGNOSIS — M25569 Pain in unspecified knee: Secondary | ICD-10-CM | POA: Diagnosis not present

## 2015-04-12 DIAGNOSIS — G894 Chronic pain syndrome: Secondary | ICD-10-CM | POA: Diagnosis not present

## 2015-04-12 NOTE — Telephone Encounter (Signed)
Left a voicemail advising of note below. Requested a call back if the pt would like to discuss.  

## 2015-04-12 NOTE — Telephone Encounter (Signed)
Ok, 1 per day is fine

## 2015-04-13 NOTE — Telephone Encounter (Signed)
I'm sorry [name] is upset. Pain Clinic is the best option for pain management. Pls f/u w/pain clinic Thx

## 2015-04-14 NOTE — Telephone Encounter (Signed)
Ok to try [employer] - it is ok w/sarcoidosis Thx

## 2015-04-14 NOTE — Telephone Encounter (Signed)
Pt informed

## 2015-04-14 NOTE — Telephone Encounter (Signed)
Pt informed of below. She states she went back to pain clinic and  was given Butrans. She is afraid to take this due to her sarcoidosis. I advised her to f/u with pain management or consult with her pharmacist.  She states "I will just buy my pain meds off the streets". She states she is not going back to the pain clinic because Dr. Andree Elk does not treat her with respect or even like a human being.

## 2015-04-17 ENCOUNTER — Encounter: Payer: Medicare Other | Admitting: Physical Therapy

## 2015-04-18 ENCOUNTER — Other Ambulatory Visit: Payer: Self-pay | Admitting: Internal Medicine

## 2015-04-18 NOTE — Telephone Encounter (Signed)
error 

## 2015-04-19 ENCOUNTER — Encounter: Payer: Medicare Other | Admitting: Physical Therapy

## 2015-04-24 ENCOUNTER — Encounter: Payer: Medicare Other | Admitting: Physical Therapy

## 2015-04-25 ENCOUNTER — Encounter: Payer: Self-pay | Admitting: Pulmonary Disease

## 2015-04-25 ENCOUNTER — Ambulatory Visit (INDEPENDENT_AMBULATORY_CARE_PROVIDER_SITE_OTHER)
Admission: RE | Admit: 2015-04-25 | Discharge: 2015-04-25 | Disposition: A | Discharge status: Home / Self Care | Payer: Medicare Other | Source: Ambulatory Visit | Attending: Pulmonary Disease | Admitting: Pulmonary Disease

## 2015-04-25 ENCOUNTER — Ambulatory Visit (INDEPENDENT_AMBULATORY_CARE_PROVIDER_SITE_OTHER): Payer: Medicare Other | Admitting: Pulmonary Disease

## 2015-04-25 VITALS — BP 118/70 | HR 88 | Ht 67.0 in | Wt 244.8 lb

## 2015-04-25 DIAGNOSIS — R0600 Dyspnea, unspecified: Secondary | ICD-10-CM | POA: Diagnosis not present

## 2015-04-25 MED ORDER — PREDNISONE 10 MG PO TABS: ORAL_TABLET | ORAL | Status: DC

## 2015-04-25 MED ORDER — ALBUTEROL SULFATE HFA 108 (90 BASE) MCG/ACT IN AERS: 2.0000 | INHALATION_SPRAY | Freq: Four times a day (QID) | RESPIRATORY_TRACT | Status: DC | PRN

## 2015-04-25 NOTE — Assessment & Plan Note (Signed)
Cleared for lt knee surgery DVT precautions as would be routine for this surgery

## 2015-04-25 NOTE — Patient Instructions (Addendum)
Prednisone 10 mg tabs  Take 2 tabs daily with food x 5ds, then 1 tab daily with food x 5ds then STOP CXR today Trial of albuterol 2 puffs every 6h as needed

## 2015-04-25 NOTE — Assessment & Plan Note (Signed)
Prednisone 10 mg tabs  Take 2 tabs daily with food x 5ds, then 1 tab daily with food x 5ds then STOP CXR today  Unclear cause of obstruction Trial of albuterol 2 puffs every 6h as needed

## 2015-04-25 NOTE — Progress Notes (Signed)
Subjective:    Patient ID: [name], female    DOB: [birth year], 55 y.o.   MRN: [id number]  HPI  55 year old obese remote smoker for followup of pulmonary sarcoidosis,nodules &  dyspnea on exertion felt to be  secondary to her morbid obesity, debility, deconditioning She smoked < 10 pyrs before she quit in 2004 Rt knee 11/2014  04/25/2015  Chief Complaint  Patient presents with  . Follow-up    Former [facility] pt; Sarcoidosis; trouble with DOE, hot flashes, dizziness x 2 weeks. Discuss taking Butrans Transdermal patches (is it okay to take with Sarcoidosis)    2 y FU  Lost 72 lbs since last OV !! Down to 47 now Given pain patches by pain mx for lt knee pain & fibromyalgia  Has lichen planus & skin lesions- sees derm Was seeing rheum for ? Sarcoid or rheumatoid arthritis- deveshwar But would like to change  Planning left TKR in may   She complains of increasing dyspnea, no cough or wheezing or pedal edema   Significant tests/ events   CXR 05/2014 stable RUl/Rt base opacities  CT chest 01/2013 Stable changes of sarcoid within the lungs and mediastinum.  Bilateral upper lobe nodular and mass like opacities are unchanged   PFT 05/2014 >> FVC 65%, ratio 69, FEV1 58%, DLCO 62%  Past Medical History  Diagnosis Date  . Osteoarthritis   . Sarcoidosis ([place])     Dr. Lolita Patella  . Anxiety   . Vitamin D deficiency     is supposed to take Vit D but can't afford it  . Allergic rhinitis   . ALLERGIC RHINITIS 10/26/2009  . ANXIETY 08/14/2006  . B12 DEFICIENCY 08/25/2007  . VITAMIN D DEFICIENCY 08/25/2007  . Hypertension     takes Coreg daily  . HYPERTENSION 08/27/2007  . DYSPNEA 04/28/2009    with exertion  . Shortness of breath   . Pneumonia     over 30 yrs ago  . Headache     last migraine 2-19yrs ago;takes Topamax daily  . Confusion   . Short-term memory loss   . Long-term memory loss   . TIA (transient ischemic attack)   . Joint pain   . Joint swelling   .  Rheumatoid arthritis ([place])   . Osteoarthritis   . Lichen planus   . Insomnia     takes Nortriptyline nightly   . Esophageal reflux     takes Nexium daily  . Depression     takes Lexapro daily  . DEPRESSION 08/14/2006  . Urinary urgency   . Nocturia   . Diabetes mellitus without complication ([place])     was on insulin but has been off since Nov 2015 and now only takes Metformin daily  . OSA (obstructive sleep apnea)     doesn't use CPAP;sleep study in epic from 2006  . History of shingles      Review of Systems neg for any significant sore throat, dysphagia, itching, sneezing, nasal congestion or excess/ purulent secretions, fever, chills, sweats, unintended wt loss, pleuritic or exertional cp, hempoptysis, orthopnea pnd or change in chronic leg swelling.  Also denies presyncope, palpitations, heartburn, abdominal pain, nausea, vomiting, diarrhea or change in bowel or urinary habits, dysuria,hematuria, rash, arthralgias, visual complaints, headache, numbness weakness or ataxia.     Objective:   Physical Exam  Gen. Pleasant, obese, in no distress ENT - no lesions, no post nasal drip Neck: No JVD, no thyromegaly, no carotid bruits Lungs: no use of accessory  muscles, no dullness to percussion, decreased without rales or rhonchi  Cardiovascular: Rhythm regular, heart sounds  normal, no murmurs or gallops, no peripheral edema Musculoskeletal: No deformities, no cyanosis or clubbing , no tremors        Assessment & Plan:

## 2015-04-26 ENCOUNTER — Ambulatory Visit: Payer: Medicare Other | Admitting: Physical Therapy

## 2015-05-10 DIAGNOSIS — M25569 Pain in unspecified knee: Secondary | ICD-10-CM | POA: Diagnosis not present

## 2015-05-10 DIAGNOSIS — M1288 Other specific arthropathies, not elsewhere classified, other specified site: Secondary | ICD-10-CM | POA: Diagnosis not present

## 2015-05-10 DIAGNOSIS — G894 Chronic pain syndrome: Secondary | ICD-10-CM | POA: Diagnosis not present

## 2015-05-10 DIAGNOSIS — Z79891 Long term (current) use of opiate analgesic: Secondary | ICD-10-CM | POA: Diagnosis not present

## 2015-05-10 DIAGNOSIS — M79643 Pain in unspecified hand: Secondary | ICD-10-CM | POA: Diagnosis not present

## 2015-05-10 DIAGNOSIS — Z79899 Other long term (current) drug therapy: Secondary | ICD-10-CM | POA: Diagnosis not present

## 2015-05-11 ENCOUNTER — Telehealth: Payer: Self-pay | Admitting: Pulmonary Disease

## 2015-05-11 MED ORDER — PREDNISONE 10 MG PO TABS: ORAL_TABLET | ORAL | Status: DC

## 2015-05-11 NOTE — Telephone Encounter (Signed)
Spoke with pt and advised of Dr Bari Mantis recommendation.  Rx sent.  Nothing further needed.

## 2015-05-11 NOTE — Telephone Encounter (Signed)
Spoke with pt and she c/o continued SOB with even minimal exertion. Pt denied cough/wheeze/CP/tightness/f/n/v. Pt states that she has been using Albuterol HFA 6 times a day with little symptom relief. Pt states that she was seen at the Pain clinic yesterday and they said she needs another pred taper for her back pain. Pt states that she feels like she is in a "sarcoid flare". Pt would like another round of prednisone sent in.   RA please advise.

## 2015-05-11 NOTE — Telephone Encounter (Signed)
Prednisone 10 mg tabs  Take 2 tabs daily with food x 5ds, then 1 tab daily with food x 5ds then STOP  

## 2015-05-18 ENCOUNTER — Other Ambulatory Visit: Payer: Self-pay | Admitting: Pulmonary Disease

## 2015-05-19 ENCOUNTER — Other Ambulatory Visit: Payer: Self-pay | Admitting: *Deleted

## 2015-05-19 MED ORDER — ALBUTEROL SULFATE HFA 108 (90 BASE) MCG/ACT IN AERS: 2.0000 | INHALATION_SPRAY | Freq: Four times a day (QID) | RESPIRATORY_TRACT | Status: DC | PRN

## 2015-06-01 DIAGNOSIS — M1712 Unilateral primary osteoarthritis, left knee: Secondary | ICD-10-CM | POA: Diagnosis not present

## 2015-06-01 DIAGNOSIS — Z96651 Presence of right artificial knee joint: Secondary | ICD-10-CM | POA: Diagnosis not present

## 2015-06-05 DIAGNOSIS — Z79899 Other long term (current) drug therapy: Secondary | ICD-10-CM | POA: Diagnosis not present

## 2015-06-05 DIAGNOSIS — M1288 Other specific arthropathies, not elsewhere classified, other specified site: Secondary | ICD-10-CM | POA: Diagnosis not present

## 2015-06-05 DIAGNOSIS — G894 Chronic pain syndrome: Secondary | ICD-10-CM | POA: Diagnosis not present

## 2015-06-05 DIAGNOSIS — M25569 Pain in unspecified knee: Secondary | ICD-10-CM | POA: Diagnosis not present

## 2015-06-05 DIAGNOSIS — M545 Low back pain: Secondary | ICD-10-CM | POA: Diagnosis not present

## 2015-06-05 DIAGNOSIS — Z79891 Long term (current) use of opiate analgesic: Secondary | ICD-10-CM | POA: Diagnosis not present

## 2015-06-07 ENCOUNTER — Other Ambulatory Visit: Payer: Self-pay | Admitting: Orthopedic Surgery

## 2015-06-08 ENCOUNTER — Ambulatory Visit: Payer: Medicare Other | Admitting: Internal Medicine

## 2015-06-08 ENCOUNTER — Other Ambulatory Visit (INDEPENDENT_AMBULATORY_CARE_PROVIDER_SITE_OTHER): Payer: Medicare Other

## 2015-06-08 ENCOUNTER — Ambulatory Visit (INDEPENDENT_AMBULATORY_CARE_PROVIDER_SITE_OTHER): Payer: Medicare Other | Admitting: Internal Medicine

## 2015-06-08 ENCOUNTER — Encounter: Payer: Self-pay | Admitting: Internal Medicine

## 2015-06-08 VITALS — BP 130/78 | HR 72 | Wt 251.0 lb

## 2015-06-08 DIAGNOSIS — E559 Vitamin D deficiency, unspecified: Secondary | ICD-10-CM

## 2015-06-08 DIAGNOSIS — S025XXS Fracture of tooth (traumatic), sequela: Secondary | ICD-10-CM

## 2015-06-08 DIAGNOSIS — M79662 Pain in left lower leg: Secondary | ICD-10-CM | POA: Diagnosis not present

## 2015-06-08 DIAGNOSIS — M170 Bilateral primary osteoarthritis of knee: Secondary | ICD-10-CM

## 2015-06-08 DIAGNOSIS — G4700 Insomnia, unspecified: Secondary | ICD-10-CM

## 2015-06-08 DIAGNOSIS — I10 Essential (primary) hypertension: Secondary | ICD-10-CM | POA: Diagnosis not present

## 2015-06-08 DIAGNOSIS — E538 Deficiency of other specified B group vitamins: Secondary | ICD-10-CM | POA: Diagnosis not present

## 2015-06-08 DIAGNOSIS — M79609 Pain in unspecified limb: Secondary | ICD-10-CM | POA: Diagnosis not present

## 2015-06-08 LAB — CBC WITH DIFFERENTIAL/PLATELET
BASOS ABS: 0 10*3/uL (ref 0.0–0.1)
Basophils Relative: 0.4 % (ref 0.0–3.0)
EOS ABS: 0.1 10*3/uL (ref 0.0–0.7)
Eosinophils Relative: 0.9 % (ref 0.0–5.0)
HEMATOCRIT: 44 % (ref 36.0–46.0)
Hemoglobin: 14.5 g/dL (ref 12.0–15.0)
LYMPHS PCT: 16.2 % (ref 12.0–46.0)
Lymphs Abs: 1.7 10*3/uL (ref 0.7–4.0)
MCHC: 33 g/dL (ref 30.0–36.0)
MCV: 93.8 fl (ref 78.0–100.0)
Monocytes Absolute: 0.7 10*3/uL (ref 0.1–1.0)
Monocytes Relative: 6.5 % (ref 3.0–12.0)
NEUTROS ABS: 7.9 10*3/uL — AB (ref 1.4–7.7)
Neutrophils Relative %: 76 % (ref 43.0–77.0)
PLATELETS: 274 10*3/uL (ref 150.0–400.0)
RBC: 4.69 Mil/uL (ref 3.87–5.11)
RDW: 13.7 % (ref 11.5–15.5)
WBC: 10.4 10*3/uL (ref 4.0–10.5)

## 2015-06-08 LAB — BASIC METABOLIC PANEL
BUN: 19 mg/dL (ref 6–23)
CALCIUM: 9.7 mg/dL (ref 8.4–10.5)
CO2: 28 mEq/L (ref 19–32)
CREATININE: 0.82 mg/dL (ref 0.40–1.20)
Chloride: 107 mEq/L (ref 96–112)
GFR: 76.97 mL/min (ref >60.00)
Glucose, Bld: 104 mg/dL — ABNORMAL HIGH (ref 70–99)
Potassium: 3.9 mEq/L (ref 3.5–5.1)
Sodium: 140 mEq/L (ref 135–145)

## 2015-06-08 LAB — HEPATIC FUNCTION PANEL
ALBUMIN: 4 g/dL (ref 3.5–5.2)
ALK PHOS: 90 U/L (ref 39–117)
ALT: 17 U/L (ref 0–35)
AST: 17 U/L (ref 0–37)
Bilirubin, Direct: 0.1 mg/dL (ref 0.0–0.3)
TOTAL PROTEIN: 7.2 g/dL (ref 6.0–8.3)
Total Bilirubin: 0.2 mg/dL (ref 0.2–1.2)

## 2015-06-08 LAB — TSH: TSH: 1.82 u[IU]/mL (ref 0.35–4.50)

## 2015-06-08 LAB — D-DIMER, QUANTITATIVE (NOT AT ARMC): D DIMER QUANT: 0.96 ug{FEU}/mL — AB (ref 0.00–0.48)

## 2015-06-08 MED ORDER — SUVOREXANT 20 MG PO TABS: 20.0000 mg | ORAL_TABLET | Freq: Every day | ORAL | Status: DC

## 2015-06-08 MED ORDER — ERGOCALCIFEROL 1.25 MG (50000 UT) PO CAPS: 50000.0000 [IU] | ORAL_CAPSULE | ORAL | Status: DC

## 2015-06-08 MED ORDER — CYANOCOBALAMIN 1000 MCG/ML IJ SOLN
1000.0000 ug | Freq: Once | INTRAMUSCULAR | Status: AC
  Administered 2015-06-08: 1000 ug via INTRAMUSCULAR

## 2015-06-08 NOTE — Assessment & Plan Note (Signed)
NAS diet 

## 2015-06-08 NOTE — Assessment & Plan Note (Signed)
L calf recurrent pain D dimer test

## 2015-06-08 NOTE — Progress Notes (Signed)
Subjective:  Patient ID: [name], female    DOB: 05-25-1960  Age: 55 y.o. MRN: [id number]  CC: No chief complaint on file.   HPI [name] presents for OA, teeth problems - had to have them to be pulled out. L TKR planned for 6.29.17. C/o a sarcoidosis flare. C/o insomnia...  Outpatient Prescriptions Prior to Visit  Medication Sig Dispense Refill  . albuterol (PROVENTIL HFA;VENTOLIN HFA) 108 (90 Base) MCG/ACT inhaler Inhale 2 puffs into the lungs every 6 (six) hours as needed for wheezing or shortness of breath. 1 Inhaler 3  . Blood Glucose Monitoring Suppl (FREESTYLE LITE) DEVI Use to check blood sugar 2 times per day 1 each 0  . carvedilol (COREG) 12.5 MG tablet Take 1 tablet (12.5 mg total) by mouth 2 (two) times daily with a meal. 180 tablet 3  . clobetasol (TEMOVATE) 0.05 % external solution Apply 1 application topically 2 (two) times daily.  3  . escitalopram (LEXAPRO) 20 MG tablet TAKE 1 TABLET BY MOUTH DAILY 90 tablet 3  . esomeprazole (NEXIUM) 40 MG capsule TAKE 1 CAPSULE BY MOUTH EVERY DAY BEFORE BREAKFAST 90 capsule 0  . glucose blood (FREESTYLE LITE) test strip TEST TWICE DAILY 100 each 3  . hydroquinone 4 % cream APPLY TO THE DARK SPOTS ON THE SKIN QHS FOR 3 MONTHS AS DIRECTED  0  . KLOR-CON 10 10 MEQ tablet TAKE 1 TABLET BY MOUTH DAILY 90 tablet 3  . Lancets (FREESTYLE) lancets 1 each by Other route 2 (two) times daily. Use as instructed 100 each 12  . metFORMIN (GLUCOPHAGE-XR) 500 MG 24 hr tablet Take 2 tablets (1,000 mg total) by mouth daily with breakfast. 180 tablet 3  . methocarbamol (ROBAXIN) 500 MG tablet Take 500 mg by mouth 2 (two) times daily.    . nortriptyline (PAMELOR) 25 MG capsule Take 2 capsules (50 mg total) by mouth at bedtime. 180 capsule 2  . oxyCODONE (ROXICODONE) 15 MG immediate release tablet Take 1 tablet (15 mg total) by mouth every 6 (six) hours as needed for pain. 120 tablet 0  . predniSONE (DELTASONE) 10 MG tablet 2TABS  (20MG ) FOR 5ds, THEN 1TAB (10MG ) FOR 5ds 15 tablet 0  . topiramate (TOPAMAX) 50 MG tablet TAKE 1 TABLET BY MOUTH TWICE DAILY 180 tablet 2  . VOLTAREN 1 % GEL APPLY 1 GM AA BID  2   No facility-administered medications prior to visit.    ROS Review of Systems  Constitutional: Positive for fatigue. Negative for chills, activity change, appetite change and unexpected weight change.  HENT: Negative for congestion, mouth sores and sinus pressure.   Eyes: Negative for visual disturbance.  Respiratory: Positive for cough and shortness of breath. Negative for chest tightness.   Cardiovascular: Positive for leg swelling.  Gastrointestinal: Negative for nausea and abdominal pain.  Genitourinary: Negative for frequency, difficulty urinating and vaginal pain.  Musculoskeletal: Positive for back pain, joint swelling and arthralgias. Negative for gait problem.  Skin: Negative for pallor and rash.  Neurological: Positive for weakness. Negative for dizziness, tremors, numbness and headaches.  Hematological: Does not bruise/bleed easily.  Psychiatric/Behavioral: Positive for sleep disturbance, dysphoric mood and decreased concentration. Negative for suicidal ideas, behavioral problems and confusion. The patient is nervous/anxious.     Objective:  BP 130/78 mmHg  Pulse 72  Wt 251 lb (113.853 kg)  SpO2 95%  BP Readings from Last 3 Encounters:  06/08/15 130/78  04/25/15 118/70  03/09/15 110/62    Wt  Readings from Last 3 Encounters:  06/08/15 251 lb (113.853 kg)  04/25/15 244 lb 12.8 oz (111.041 kg)  03/09/15 236 lb (107.049 kg)    Physical Exam  Constitutional: She appears well-developed. No distress.  HENT:  Head: Normocephalic.  Right Ear: External ear normal.  Left Ear: External ear normal.  Nose: Nose normal.  Mouth/Throat: Oropharynx is clear and moist.  Eyes: Conjunctivae are normal. Pupils are equal, round, and reactive to light. Right eye exhibits no discharge. Left eye exhibits  no discharge.  Neck: Normal range of motion. Neck supple. No JVD present. No tracheal deviation present. No thyromegaly present.  Cardiovascular: Normal rate, regular rhythm and normal heart sounds.   Pulmonary/Chest: No stridor. No respiratory distress. She has no wheezes.  Abdominal: Soft. Bowel sounds are normal. She exhibits no distension and no mass. There is no tenderness. There is no rebound and no guarding.  Musculoskeletal: She exhibits tenderness. She exhibits no edema.  Lymphadenopathy:    She has no cervical adenopathy.  Neurological: She displays normal reflexes. No cranial nerve deficit. She exhibits normal muscle tone. Coordination abnormal.  Skin: No rash noted. No erythema.  Psychiatric: She has a normal mood and affect. Her behavior is normal. Judgment and thought content normal.  Lichen on skin Obese Cane LS, knees - tender  Lab Results  Component Value Date   WBC 7.3 04/03/2015   HGB 13.4 04/03/2015   HCT 39.8 04/03/2015   PLT 265.0 04/03/2015   GLUCOSE 112* 04/03/2015   CHOL 193 06/29/2008   TRIG 145.0 06/29/2008   HDL 67.50 06/29/2008   LDLCALC 97 06/29/2008   ALT 35 04/03/2015   AST 31 04/03/2015   NA 140 04/03/2015   K 4.1 04/03/2015   CL 103 04/03/2015   CREATININE 1.08 04/03/2015   BUN 22 04/03/2015   CO2 27 04/03/2015   TSH 1.54 04/03/2015   INR 1.01 10/01/2010   HGBA1C 6.0 04/03/2015   MICROALBUR <0.7 02/01/2015    Dg Chest 2 View  04/26/2015  CLINICAL DATA:  Dyspnea for 2 weeks EXAM: CHEST  2 VIEW COMPARISON:  05/23/2014 FINDINGS: Cardiac shadow is within normal limits. Scarring is noted right lung apex and right lung base stable from the prior exam. No focal infiltrate or sizable effusion is noted. No bony abnormality is seen. IMPRESSION: Stable scarring without acute abnormality. Electronically Signed   By: Inez Catalina M.D.   On: 04/26/2015 07:55    Assessment & Plan:   There are no diagnoses linked to this encounter. I am having Ms.  Steinhardt maintain her clobetasol, escitalopram, glucose blood, freestyle, FREESTYLE LITE, KLOR-CON 10, topiramate, methocarbamol, carvedilol, oxyCODONE, VOLTAREN, hydroquinone, nortriptyline, metFORMIN, esomeprazole, predniSONE, albuterol, BUTRANS, and chlorhexidine.  Meds ordered this encounter  Medications  . BUTRANS 20 MCG/HR PTWK patch    Sig: Place 1 patch onto the skin every 7 (seven) days.  . chlorhexidine (PERIDEX) 0.12 % solution    Sig: 2 (two) times daily.    Refill:  0     Follow-up: No Follow-up on file.  Walker Kehr, MD

## 2015-06-08 NOTE — Assessment & Plan Note (Signed)
Teeth extracted. Dentures are being fitted

## 2015-06-08 NOTE — Progress Notes (Signed)
Pre visit review using our clinic review tool, if applicable. No additional management support is needed unless otherwise documented below in the visit note. 

## 2015-06-08 NOTE — Assessment & Plan Note (Signed)
L TKR planned for 6.29.17.

## 2015-06-08 NOTE — Assessment & Plan Note (Signed)
Risks associated with treatment noncompliance were discussed. Compliance was encouraged. Vit D 50000 iu q 1 mo Rx

## 2015-06-08 NOTE — Assessment & Plan Note (Signed)
5/17 Belsomra trial

## 2015-06-12 ENCOUNTER — Other Ambulatory Visit: Payer: Self-pay | Admitting: Internal Medicine

## 2015-06-12 DIAGNOSIS — M25562 Pain in left knee: Secondary | ICD-10-CM

## 2015-06-13 ENCOUNTER — Other Ambulatory Visit: Payer: Self-pay | Admitting: Adult Health

## 2015-06-14 ENCOUNTER — Telehealth: Payer: Self-pay

## 2015-06-14 NOTE — Telephone Encounter (Signed)
PA initiated via [place]

## 2015-06-15 ENCOUNTER — Telehealth: Payer: Self-pay

## 2015-06-15 NOTE — Telephone Encounter (Signed)
Patient states she was suppose to have a referral sent in somewhere because of a blood clot she has. Please follow up. I did not see anything about it in her file.

## 2015-06-15 NOTE — Telephone Encounter (Signed)
APPROVED 03/17/2015 - 06/14/2016

## 2015-06-15 NOTE — Telephone Encounter (Signed)
Additional clinical information form completed and placed on MD's desk for signature

## 2015-06-18 NOTE — Telephone Encounter (Signed)
Pls check on venous doppler US order status from 5/29 Thx

## 2015-06-19 ENCOUNTER — Encounter: Payer: Medicare Other | Admitting: Internal Medicine

## 2015-06-19 NOTE — Telephone Encounter (Signed)
This has been reorder per request below.

## 2015-06-19 NOTE — Telephone Encounter (Signed)
Thx

## 2015-06-19 NOTE — Telephone Encounter (Signed)
Can you please change order to be done at location [facility]

## 2015-06-19 NOTE — Telephone Encounter (Signed)
Please take a look. I can not tell if it was ever scheduled.

## 2015-06-20 ENCOUNTER — Other Ambulatory Visit: Payer: Self-pay | Admitting: Internal Medicine

## 2015-06-20 ENCOUNTER — Ambulatory Visit (HOSPITAL_COMMUNITY)
Admission: RE | Admit: 2015-06-20 | Discharge: 2015-06-20 | Disposition: A | Discharge status: Home / Self Care | Payer: Medicare Other | Source: Ambulatory Visit | Attending: Internal Medicine | Admitting: Internal Medicine

## 2015-06-20 ENCOUNTER — Telehealth: Payer: Self-pay

## 2015-06-20 DIAGNOSIS — R0602 Shortness of breath: Secondary | ICD-10-CM | POA: Diagnosis not present

## 2015-06-20 DIAGNOSIS — F419 Anxiety disorder, unspecified: Secondary | ICD-10-CM | POA: Insufficient documentation

## 2015-06-20 DIAGNOSIS — M7989 Other specified soft tissue disorders: Secondary | ICD-10-CM

## 2015-06-20 DIAGNOSIS — M79605 Pain in left leg: Secondary | ICD-10-CM | POA: Diagnosis not present

## 2015-06-20 DIAGNOSIS — I10 Essential (primary) hypertension: Secondary | ICD-10-CM | POA: Insufficient documentation

## 2015-06-20 DIAGNOSIS — G4733 Obstructive sleep apnea (adult) (pediatric): Secondary | ICD-10-CM | POA: Diagnosis not present

## 2015-06-20 DIAGNOSIS — E119 Type 2 diabetes mellitus without complications: Secondary | ICD-10-CM | POA: Diagnosis not present

## 2015-06-20 DIAGNOSIS — M25562 Pain in left knee: Secondary | ICD-10-CM | POA: Insufficient documentation

## 2015-06-20 DIAGNOSIS — F329 Major depressive disorder, single episode, unspecified: Secondary | ICD-10-CM | POA: Diagnosis not present

## 2015-06-20 MED ORDER — FUROSEMIDE 20 MG PO TABS: 20.0000 mg | ORAL_TABLET | Freq: Every day | ORAL | Status: DC

## 2015-06-20 NOTE — Telephone Encounter (Signed)
Noted Start Furosemide OV w/me pls Thx

## 2015-06-20 NOTE — Telephone Encounter (Signed)
Received call from [place] reports pt NEG for DVT. Pt does report SOB over the past 2 days routing as fyi to provider

## 2015-06-21 NOTE — Telephone Encounter (Signed)
Please schedule apt w Plotnikov, thnx

## 2015-06-21 NOTE — Telephone Encounter (Signed)
Left patient vm to call back to schedule appt  °

## 2015-06-22 NOTE — Telephone Encounter (Signed)
Pt informed

## 2015-06-23 ENCOUNTER — Encounter: Payer: Self-pay | Admitting: Internal Medicine

## 2015-06-23 ENCOUNTER — Ambulatory Visit (INDEPENDENT_AMBULATORY_CARE_PROVIDER_SITE_OTHER): Payer: Medicare Other | Admitting: Internal Medicine

## 2015-06-23 VITALS — BP 110/68 | HR 81 | Wt 254.0 lb

## 2015-06-23 DIAGNOSIS — M25562 Pain in left knee: Secondary | ICD-10-CM | POA: Diagnosis not present

## 2015-06-23 DIAGNOSIS — M79662 Pain in left lower leg: Secondary | ICD-10-CM

## 2015-06-23 DIAGNOSIS — E538 Deficiency of other specified B group vitamins: Secondary | ICD-10-CM | POA: Diagnosis not present

## 2015-06-23 DIAGNOSIS — M79605 Pain in left leg: Secondary | ICD-10-CM | POA: Diagnosis not present

## 2015-06-23 DIAGNOSIS — F411 Generalized anxiety disorder: Secondary | ICD-10-CM | POA: Diagnosis not present

## 2015-06-23 MED ORDER — CYANOCOBALAMIN 1000 MCG/ML IJ SOLN
1000.0000 ug | Freq: Once | INTRAMUSCULAR | Status: AC
  Administered 2015-06-23: 1000 ug via INTRAMUSCULAR

## 2015-06-23 MED ORDER — NORTRIPTYLINE HCL 75 MG PO CAPS: 75.0000 mg | ORAL_CAPSULE | Freq: Every day | ORAL | Status: DC

## 2015-06-23 NOTE — Assessment & Plan Note (Signed)
6/17 Dr Marlou Sa is planning L TKR

## 2015-06-23 NOTE — Assessment & Plan Note (Signed)
Lexapro, Pamelor - increased to 75 mg/d

## 2015-06-23 NOTE — Assessment & Plan Note (Signed)
No DVT LLE edema and pain. Venous doppler was (-) on 06/20/15

## 2015-06-23 NOTE — Progress Notes (Signed)
Subjective:  Patient ID: [name], female    DOB: 12/17/1960  Age: 55 y.o. MRN: [id number]  CC: No chief complaint on file.   HPI [name] presents for LLE edema and pain. Venous doppler was (-) on 06/20/15. C/o worsening anxiety. F/u Vit B12 def.  Outpatient Prescriptions Prior to Visit  Medication Sig Dispense Refill  . albuterol (PROVENTIL HFA;VENTOLIN HFA) 108 (90 Base) MCG/ACT inhaler Inhale 2 puffs into the lungs every 6 (six) hours as needed for wheezing or shortness of breath. 1 Inhaler 3  . Blood Glucose Monitoring Suppl (FREESTYLE LITE) DEVI Use to check blood sugar 2 times per day 1 each 0  . BUTRANS 20 MCG/HR PTWK patch Place 1 patch onto the skin every 7 (seven) days.    . carvedilol (COREG) 12.5 MG tablet Take 1 tablet (12.5 mg total) by mouth 2 (two) times daily with a meal. 180 tablet 3  . chlorhexidine (PERIDEX) 0.12 % solution 2 (two) times daily.  0  . clobetasol (TEMOVATE) 0.05 % external solution Apply 1 application topically 2 (two) times daily.  3  . ergocalciferol (VITAMIN D2) 50000 units capsule Take 1 capsule (50,000 Units total) by mouth every 30 (thirty) days. 3 capsule 3  . escitalopram (LEXAPRO) 20 MG tablet TAKE 1 TABLET BY MOUTH DAILY 90 tablet 3  . esomeprazole (NEXIUM) 40 MG capsule TAKE 1 CAPSULE BY MOUTH EVERY DAY BEFORE BREAKFAST 90 capsule 0  . furosemide (LASIX) 20 MG tablet Take 1 tablet (20 mg total) by mouth daily. 30 tablet 3  . glucose blood (FREESTYLE LITE) test strip TEST TWICE DAILY 100 each 3  . hydroquinone 4 % cream APPLY TO THE DARK SPOTS ON THE SKIN QHS FOR 3 MONTHS AS DIRECTED  0  . KLOR-CON 10 10 MEQ tablet TAKE 1 TABLET BY MOUTH DAILY 90 tablet 3  . Lancets (FREESTYLE) lancets 1 each by Other route 2 (two) times daily. Use as instructed 100 each 12  . metFORMIN (GLUCOPHAGE-XR) 500 MG 24 hr tablet Take 2 tablets (1,000 mg total) by mouth daily with breakfast. 180 tablet 3  . methocarbamol (ROBAXIN) 500 MG  tablet Take 500 mg by mouth 2 (two) times daily.    . nortriptyline (PAMELOR) 25 MG capsule Take 2 capsules (50 mg total) by mouth at bedtime. 180 capsule 2  . oxyCODONE (ROXICODONE) 15 MG immediate release tablet Take 1 tablet (15 mg total) by mouth every 6 (six) hours as needed for pain. 120 tablet 0  . predniSONE (DELTASONE) 10 MG tablet 2TABS (20MG ) FOR 5ds, THEN 1TAB (10MG ) FOR 5ds 15 tablet 0  . Suvorexant (BELSOMRA) 20 MG TABS Take 20 mg by mouth at bedtime. 30 tablet 5  . topiramate (TOPAMAX) 50 MG tablet TAKE 1 TABLET BY MOUTH TWICE DAILY 180 tablet 2  . VOLTAREN 1 % GEL APPLY 1 GM AA BID  2   No facility-administered medications prior to visit.    ROS Review of Systems  Constitutional: Positive for fatigue. Negative for chills, activity change, appetite change and unexpected weight change.  HENT: Negative for congestion, mouth sores and sinus pressure.   Eyes: Negative for visual disturbance.  Respiratory: Positive for shortness of breath. Negative for cough and chest tightness.   Cardiovascular: Positive for leg swelling. Negative for chest pain.  Gastrointestinal: Negative for nausea and abdominal pain.  Genitourinary: Negative for frequency, difficulty urinating and vaginal pain.  Musculoskeletal: Positive for back pain, arthralgias and gait problem.  Skin: Negative for  pallor and rash.  Neurological: Negative for dizziness, tremors, weakness, numbness and headaches.  Psychiatric/Behavioral: Negative for confusion and sleep disturbance. The patient is nervous/anxious.     Objective:  BP 110/68 mmHg  Pulse 81  Wt 254 lb (115.214 kg)  SpO2 97%  BP Readings from Last 3 Encounters:  06/23/15 110/68  06/08/15 130/78  04/25/15 118/70    Wt Readings from Last 3 Encounters:  06/23/15 254 lb (115.214 kg)  06/08/15 251 lb (113.853 kg)  04/25/15 244 lb 12.8 oz (111.041 kg)    Physical Exam  Constitutional: She appears well-developed. No distress.  HENT:  Head:  Normocephalic.  Right Ear: External ear normal.  Left Ear: External ear normal.  Nose: Nose normal.  Mouth/Throat: Oropharynx is clear and moist.  Eyes: Conjunctivae are normal. Pupils are equal, round, and reactive to light. Right eye exhibits no discharge. Left eye exhibits no discharge.  Neck: Normal range of motion. Neck supple. No JVD present. No tracheal deviation present. No thyromegaly present.  Cardiovascular: Normal rate, regular rhythm and normal heart sounds.   Pulmonary/Chest: No stridor. No respiratory distress. She has no wheezes.  Abdominal: Soft. Bowel sounds are normal. She exhibits no distension and no mass. There is no tenderness. There is no rebound and no guarding.  Musculoskeletal: She exhibits edema and tenderness.  Lymphadenopathy:    She has no cervical adenopathy.  Neurological: She displays normal reflexes. No cranial nerve deficit. She exhibits normal muscle tone. Coordination abnormal.  Skin: Rash noted. No erythema.  Psychiatric: She has a normal mood and affect. Her behavior is normal. Judgment and thought content normal.  Obese Cane Trace edema, rash on L anterior dist shin L knee is tender Calves NT  Lab Results  Component Value Date   WBC 10.4 06/08/2015   HGB 14.5 06/08/2015   HCT 44.0 06/08/2015   PLT 274.0 06/08/2015   GLUCOSE 104* 06/08/2015   CHOL 193 06/29/2008   TRIG 145.0 06/29/2008   HDL 67.50 06/29/2008   LDLCALC 97 06/29/2008   ALT 17 06/08/2015   AST 17 06/08/2015   NA 140 06/08/2015   K 3.9 06/08/2015   CL 107 06/08/2015   CREATININE 0.82 06/08/2015   BUN 19 06/08/2015   CO2 28 06/08/2015   TSH 1.82 06/08/2015   INR 1.01 10/01/2010   HGBA1C 6.0 04/03/2015   MICROALBUR <0.7 02/01/2015    No results found.  Assessment & Plan:   There are no diagnoses linked to this encounter. I am having Ms. Gottschall maintain her clobetasol, escitalopram, glucose blood, freestyle, FREESTYLE LITE, KLOR-CON 10, topiramate,  methocarbamol, carvedilol, oxyCODONE, VOLTAREN, hydroquinone, nortriptyline, metFORMIN, esomeprazole, predniSONE, albuterol, BUTRANS, chlorhexidine, Suvorexant, ergocalciferol, and furosemide.  No orders of the defined types were placed in this encounter.     Follow-up: No Follow-up on file.  Walker Kehr, MD

## 2015-06-23 NOTE — Assessment & Plan Note (Signed)
6/17 calf pain is resolving. Doppler (-)  Dr Marlou Sa is planning L TKR

## 2015-06-23 NOTE — Progress Notes (Signed)
Pre visit review using our clinic review tool, if applicable. No additional management support is needed unless otherwise documented below in the visit note. 

## 2015-06-23 NOTE — Assessment & Plan Note (Addendum)
On B12 shots Will give another B12 inj now

## 2015-07-03 ENCOUNTER — Other Ambulatory Visit (HOSPITAL_COMMUNITY): Payer: Medicare Other

## 2015-07-03 DIAGNOSIS — Z79899 Other long term (current) drug therapy: Secondary | ICD-10-CM | POA: Diagnosis not present

## 2015-07-03 DIAGNOSIS — G894 Chronic pain syndrome: Secondary | ICD-10-CM | POA: Diagnosis not present

## 2015-07-03 DIAGNOSIS — Z79891 Long term (current) use of opiate analgesic: Secondary | ICD-10-CM | POA: Diagnosis not present

## 2015-07-03 DIAGNOSIS — E669 Obesity, unspecified: Secondary | ICD-10-CM | POA: Diagnosis not present

## 2015-07-04 ENCOUNTER — Other Ambulatory Visit: Payer: Self-pay

## 2015-07-04 ENCOUNTER — Encounter (HOSPITAL_COMMUNITY)
Admission: RE | Admit: 2015-07-04 | Discharge: 2015-07-04 | Disposition: A | Discharge status: Home / Self Care | Payer: Medicare Other | Source: Ambulatory Visit | Attending: Orthopedic Surgery | Admitting: Orthopedic Surgery

## 2015-07-04 ENCOUNTER — Encounter (HOSPITAL_COMMUNITY): Payer: Self-pay

## 2015-07-04 ENCOUNTER — Other Ambulatory Visit (HOSPITAL_COMMUNITY): Payer: Self-pay | Admitting: *Deleted

## 2015-07-04 DIAGNOSIS — Z0183 Encounter for blood typing: Secondary | ICD-10-CM | POA: Insufficient documentation

## 2015-07-04 DIAGNOSIS — Z01812 Encounter for preprocedural laboratory examination: Secondary | ICD-10-CM | POA: Diagnosis not present

## 2015-07-04 DIAGNOSIS — Z01818 Encounter for other preprocedural examination: Secondary | ICD-10-CM | POA: Diagnosis not present

## 2015-07-04 DIAGNOSIS — M1712 Unilateral primary osteoarthritis, left knee: Secondary | ICD-10-CM | POA: Insufficient documentation

## 2015-07-04 HISTORY — DX: Fibromyalgia: M79.7

## 2015-07-04 LAB — BASIC METABOLIC PANEL
ANION GAP: 7 (ref 5–15)
BUN: 13 mg/dL (ref 6–20)
CHLORIDE: 106 mmol/L (ref 101–111)
CO2: 26 mmol/L (ref 22–32)
Calcium: 9.3 mg/dL (ref 8.9–10.3)
Creatinine, Ser: 1.23 mg/dL — ABNORMAL HIGH (ref 0.44–1.00)
GFR calc non Af Amer: 49 mL/min — ABNORMAL LOW (ref >60)
GFR, EST AFRICAN AMERICAN: 57 mL/min — AB (ref >60)
GLUCOSE: 134 mg/dL — AB (ref 65–99)
Potassium: 3.9 mmol/L (ref 3.5–5.1)
Sodium: 139 mmol/L (ref 135–145)

## 2015-07-04 LAB — URINALYSIS, ROUTINE W REFLEX MICROSCOPIC
Glucose, UA: NEGATIVE mg/dL
Hgb urine dipstick: NEGATIVE
KETONES UR: NEGATIVE mg/dL
LEUKOCYTES UA: NEGATIVE
NITRITE: NEGATIVE
PH: 6 (ref 5.0–8.0)
Protein, ur: NEGATIVE mg/dL
SPECIFIC GRAVITY, URINE: 1.026 (ref 1.005–1.030)

## 2015-07-04 LAB — CBC
HEMATOCRIT: 39.3 % (ref 36.0–46.0)
HEMOGLOBIN: 12.4 g/dL (ref 12.0–15.0)
MCH: 30.9 pg (ref 26.0–34.0)
MCHC: 31.6 g/dL (ref 30.0–36.0)
MCV: 98 fL (ref 78.0–100.0)
Platelets: 236 10*3/uL (ref 150–400)
RBC: 4.01 MIL/uL (ref 3.87–5.11)
RDW: 12.9 % (ref 11.5–15.5)
WBC: 7.1 10*3/uL (ref 4.0–10.5)

## 2015-07-04 LAB — GLUCOSE, CAPILLARY: GLUCOSE-CAPILLARY: 158 mg/dL — AB (ref 65–99)

## 2015-07-04 LAB — SURGICAL PCR SCREEN
MRSA, PCR: NEGATIVE
Staphylococcus aureus: POSITIVE — AB

## 2015-07-04 LAB — TYPE AND SCREEN
ABO/RH(D): B POS
Antibody Screen: NEGATIVE

## 2015-07-04 NOTE — Pre-Procedure Instructions (Signed)
[name]  07/04/2015      [facility] - [place], [address] [place] [address] Phone: [phone] Fax: [phone]    Your procedure is scheduled on 07-13-2015  Thursday .  Report to [facility] Admitting at 9:45 A.M.   Call this number if you have problems the morning of surgery:  [phone]   Remember:  Do not eat food or drink liquids after midnight.   Take these medicines the morning of surgery with A SIP OF WATER   Albuterol Inhaler if needed,carvedilol(Coreg),Esomeprazole(Nexium),Gabapentin(Neurotin), KLOR-CON, methocarbamol(Robaxin),Topiramate(Topamax),           STOP ASPIRIN,ANTI-INFLAMMATORIES,VITAMINS AND HERBAL SUPPLEMENTS 5-7 DAYS PRIOR TO SURGERY.             How to Manage Your Diabetes Before and After Surgery  Why is it important to control my blood sugar before and after surgery? . Improving blood sugar levels before and after surgery helps healing and can limit problems. . A way of improving blood sugar control is eating a healthy diet by: o  Eating less sugar and carbohydrates o  Increasing activity/exercise o  Talking with your doctor about reaching your blood sugar goals . High blood sugars (greater than 180 mg/dL) can raise your risk of infections and slow your recovery, so you will need to focus on controlling your diabetes during the weeks before surgery. . Make sure that the doctor who takes care of your diabetes knows about your planned surgery including the date and location.  How do I manage my blood sugar before surgery? . Check your blood sugar at least 4 times a day, starting 2 days before surgery, to make sure that the level is not too high or low. o Check your blood sugar the morning of your surgery when you wake up and every 2 hours until you get to the Short Stay unit. . If your blood sugar is less than 70 mg/dL,  you will need to treat for low blood sugar: o Do not take insulin. o Treat a low blood sugar (less than 70 mg/dL) with  cup of clear juice (cranberry or apple), 4 glucose tablets, OR glucose gel. o Recheck blood sugar in 15 minutes after treatment (to make sure it is greater than 70 mg/dL). If your blood sugar is not greater than 70 mg/dL on recheck, call [phone] for further instructions. . Report your blood sugar to the short stay nurse when you get to Short Stay.  . If you are admitted to the hospital after surgery: o Your blood sugar will be checked by the staff and you will probably be given insulin after surgery (instead of oral diabetes medicines) to make sure you have good blood sugar levels. o The goal for blood sugar control after surgery is 80-180 mg/dL.              WHAT DO I DO ABOUT MY DIABETES MEDICATION?   [name] Do not take oral diabetes medicines (pills) the morning of surgery   Reviewed and Endorsed by [facility] Patient Education Committee, August 2015    Do not wear jewelry, make-up or nail polish.  Do not wear lotions, powders, or perfumes.  You may not wear deoderant.  Do not shave 48 hours prior to surgery.     Do not bring valuables to the hospital.  [facility] is not responsible  for any belongings or valuables.  Contacts, dentures or bridgework may not be worn into surgery.  Leave your suitcase in the car.  After surgery it may be brought to your room.  For patients admitted to the hospital, discharge time will be determined by your treatment team.  Patients discharged the day of surgery will not be allowed to drive home.    Special instructions:  See attached sheet for instructions on CHG showers  Please read over the following fact sheets that you were given. Coughing and Deep Breathing, MRSA Information and Surgical Site Infection Prevention

## 2015-07-04 NOTE — Progress Notes (Signed)
Pt. Notified of results of PCR. Rx. For mupirocin called to [facility]  On [address].

## 2015-07-05 LAB — URINE CULTURE

## 2015-07-05 LAB — HEMOGLOBIN A1C
Hgb A1c MFr Bld: 5.8 % — ABNORMAL HIGH (ref 4.8–5.6)
MEAN PLASMA GLUCOSE: 120 mg/dL

## 2015-07-12 ENCOUNTER — Other Ambulatory Visit: Payer: Self-pay | Admitting: Internal Medicine

## 2015-07-12 MED ORDER — CEFAZOLIN SODIUM-DEXTROSE 2-4 GM/100ML-% IV SOLN
2.0000 g | INTRAVENOUS | Status: AC
  Administered 2015-07-13: 2 g via INTRAVENOUS
  Filled 2015-07-12: qty 100

## 2015-07-13 ENCOUNTER — Encounter (HOSPITAL_COMMUNITY): Payer: Self-pay | Admitting: *Deleted

## 2015-07-13 ENCOUNTER — Inpatient Hospital Stay (HOSPITAL_COMMUNITY): Payer: Medicare Other | Admitting: Certified Registered Nurse Anesthetist

## 2015-07-13 ENCOUNTER — Encounter (HOSPITAL_COMMUNITY)
Admission: RE | Disposition: A | Discharge status: Skilled Nursing Facility | Payer: Self-pay | Source: Ambulatory Visit | Attending: Orthopedic Surgery

## 2015-07-13 ENCOUNTER — Inpatient Hospital Stay (HOSPITAL_COMMUNITY)
Admission: RE | Admit: 2015-07-13 | Discharge: 2015-07-16 | DRG: 470 | Disposition: A | Discharge status: Skilled Nursing Facility | Payer: Medicare Other | Source: Ambulatory Visit | Attending: Orthopedic Surgery | Admitting: Orthopedic Surgery

## 2015-07-13 DIAGNOSIS — Z841 Family history of disorders of kidney and ureter: Secondary | ICD-10-CM | POA: Diagnosis not present

## 2015-07-13 DIAGNOSIS — Z91048 Other nonmedicinal substance allergy status: Secondary | ICD-10-CM

## 2015-07-13 DIAGNOSIS — M179 Osteoarthritis of knee, unspecified: Secondary | ICD-10-CM | POA: Diagnosis present

## 2015-07-13 DIAGNOSIS — E119 Type 2 diabetes mellitus without complications: Secondary | ICD-10-CM | POA: Diagnosis present

## 2015-07-13 DIAGNOSIS — R269 Unspecified abnormalities of gait and mobility: Secondary | ICD-10-CM | POA: Diagnosis not present

## 2015-07-13 DIAGNOSIS — I10 Essential (primary) hypertension: Secondary | ICD-10-CM | POA: Diagnosis present

## 2015-07-13 DIAGNOSIS — I2510 Atherosclerotic heart disease of native coronary artery without angina pectoris: Secondary | ICD-10-CM | POA: Diagnosis present

## 2015-07-13 DIAGNOSIS — Z6838 Body mass index (BMI) 38.0-38.9, adult: Secondary | ICD-10-CM | POA: Diagnosis not present

## 2015-07-13 DIAGNOSIS — Z96651 Presence of right artificial knee joint: Secondary | ICD-10-CM | POA: Diagnosis present

## 2015-07-13 DIAGNOSIS — Z888 Allergy status to other drugs, medicaments and biological substances status: Secondary | ICD-10-CM

## 2015-07-13 DIAGNOSIS — M069 Rheumatoid arthritis, unspecified: Secondary | ICD-10-CM | POA: Diagnosis not present

## 2015-07-13 DIAGNOSIS — Z8249 Family history of ischemic heart disease and other diseases of the circulatory system: Secondary | ICD-10-CM | POA: Diagnosis not present

## 2015-07-13 DIAGNOSIS — Z8673 Personal history of transient ischemic attack (TIA), and cerebral infarction without residual deficits: Secondary | ICD-10-CM | POA: Diagnosis not present

## 2015-07-13 DIAGNOSIS — Z87891 Personal history of nicotine dependence: Secondary | ICD-10-CM

## 2015-07-13 DIAGNOSIS — E559 Vitamin D deficiency, unspecified: Secondary | ICD-10-CM | POA: Diagnosis present

## 2015-07-13 DIAGNOSIS — D869 Sarcoidosis, unspecified: Secondary | ICD-10-CM | POA: Diagnosis present

## 2015-07-13 DIAGNOSIS — Z806 Family history of leukemia: Secondary | ICD-10-CM | POA: Diagnosis not present

## 2015-07-13 DIAGNOSIS — Z91040 Latex allergy status: Secondary | ICD-10-CM | POA: Diagnosis not present

## 2015-07-13 DIAGNOSIS — R262 Difficulty in walking, not elsewhere classified: Secondary | ICD-10-CM | POA: Diagnosis not present

## 2015-07-13 DIAGNOSIS — M797 Fibromyalgia: Secondary | ICD-10-CM | POA: Diagnosis not present

## 2015-07-13 DIAGNOSIS — M13869 Other specified arthritis, unspecified knee: Secondary | ICD-10-CM | POA: Diagnosis not present

## 2015-07-13 DIAGNOSIS — M1710 Unilateral primary osteoarthritis, unspecified knee: Secondary | ICD-10-CM | POA: Diagnosis present

## 2015-07-13 DIAGNOSIS — M25562 Pain in left knee: Secondary | ICD-10-CM | POA: Diagnosis not present

## 2015-07-13 DIAGNOSIS — K219 Gastro-esophageal reflux disease without esophagitis: Secondary | ICD-10-CM | POA: Diagnosis present

## 2015-07-13 DIAGNOSIS — M1712 Unilateral primary osteoarthritis, left knee: Principal | ICD-10-CM | POA: Diagnosis present

## 2015-07-13 DIAGNOSIS — Z803 Family history of malignant neoplasm of breast: Secondary | ICD-10-CM

## 2015-07-13 DIAGNOSIS — R0602 Shortness of breath: Secondary | ICD-10-CM | POA: Diagnosis not present

## 2015-07-13 DIAGNOSIS — G4733 Obstructive sleep apnea (adult) (pediatric): Secondary | ICD-10-CM | POA: Diagnosis present

## 2015-07-13 DIAGNOSIS — M1990 Unspecified osteoarthritis, unspecified site: Secondary | ICD-10-CM | POA: Diagnosis not present

## 2015-07-13 DIAGNOSIS — F329 Major depressive disorder, single episode, unspecified: Secondary | ICD-10-CM | POA: Diagnosis present

## 2015-07-13 DIAGNOSIS — Z5189 Encounter for other specified aftercare: Secondary | ICD-10-CM | POA: Diagnosis not present

## 2015-07-13 DIAGNOSIS — M6281 Muscle weakness (generalized): Secondary | ICD-10-CM | POA: Diagnosis not present

## 2015-07-13 HISTORY — PX: TOTAL KNEE ARTHROPLASTY: SHX125

## 2015-07-13 LAB — GLUCOSE, CAPILLARY
GLUCOSE-CAPILLARY: 96 mg/dL (ref 65–99)
Glucose-Capillary: 95 mg/dL (ref 65–99)
Glucose-Capillary: 101 mg/dL — ABNORMAL HIGH (ref 65–99)
Glucose-Capillary: 81 mg/dL (ref 65–99)

## 2015-07-13 SURGERY — ARTHROPLASTY, KNEE, TOTAL
Anesthesia: Monitor Anesthesia Care | Site: Knee | Laterality: Left

## 2015-07-13 MED ORDER — CHLORHEXIDINE GLUCONATE 4 % EX LIQD: 60.0000 mL | Freq: Once | CUTANEOUS | Status: DC

## 2015-07-13 MED ORDER — RIVAROXABAN 10 MG PO TABS
10.0000 mg | ORAL_TABLET | Freq: Every day | ORAL | Status: DC
  Administered 2015-07-14 – 2015-07-16 (×3): 10 mg via ORAL
  Filled 2015-07-13 (×3): qty 1

## 2015-07-13 MED ORDER — BUPIVACAINE LIPOSOME 1.3 % IJ SUSP
20.0000 mL | INTRAMUSCULAR | Status: AC
  Administered 2015-07-13: 20 mL
  Filled 2015-07-13: qty 20

## 2015-07-13 MED ORDER — POTASSIUM CHLORIDE ER 10 MEQ PO TBCR
10.0000 meq | EXTENDED_RELEASE_TABLET | Freq: Every day | ORAL | Status: DC
  Administered 2015-07-14 – 2015-07-16 (×3): 10 meq via ORAL
  Filled 2015-07-13 (×7): qty 1

## 2015-07-13 MED ORDER — HYDROMORPHONE HCL 1 MG/ML IJ SOLN: 0.2500 mg | INTRAMUSCULAR | Status: DC | PRN

## 2015-07-13 MED ORDER — ONDANSETRON HCL 4 MG/2ML IJ SOLN: 4.0000 mg | Freq: Four times a day (QID) | INTRAMUSCULAR | Status: DC | PRN

## 2015-07-13 MED ORDER — CEFAZOLIN SODIUM-DEXTROSE 2-4 GM/100ML-% IV SOLN
2.0000 g | Freq: Four times a day (QID) | INTRAVENOUS | Status: AC
  Administered 2015-07-13 – 2015-07-14 (×2): 2 g via INTRAVENOUS
  Filled 2015-07-13 (×2): qty 100

## 2015-07-13 MED ORDER — ESCITALOPRAM OXALATE 10 MG PO TABS
20.0000 mg | ORAL_TABLET | Freq: Every day | ORAL | Status: DC
  Administered 2015-07-13 – 2015-07-16 (×4): 20 mg via ORAL
  Filled 2015-07-13 (×4): qty 2

## 2015-07-13 MED ORDER — METOCLOPRAMIDE HCL 5 MG/ML IJ SOLN: 5.0000 mg | Freq: Three times a day (TID) | INTRAMUSCULAR | Status: DC | PRN

## 2015-07-13 MED ORDER — CARVEDILOL 12.5 MG PO TABS
12.5000 mg | ORAL_TABLET | Freq: Two times a day (BID) | ORAL | Status: DC
  Administered 2015-07-13 – 2015-07-16 (×5): 12.5 mg via ORAL
  Filled 2015-07-13 (×5): qty 1

## 2015-07-13 MED ORDER — ACETAMINOPHEN 325 MG PO TABS
650.0000 mg | ORAL_TABLET | Freq: Four times a day (QID) | ORAL | Status: DC | PRN
  Administered 2015-07-14: 650 mg via ORAL
  Filled 2015-07-13: qty 2

## 2015-07-13 MED ORDER — BUPIVACAINE HCL (PF) 0.25 % IJ SOLN
INTRAMUSCULAR | Status: DC | PRN
  Administered 2015-07-13: 10 mL
  Administered 2015-07-13: 20 mL

## 2015-07-13 MED ORDER — 0.9 % SODIUM CHLORIDE (POUR BTL) OPTIME
TOPICAL | Status: DC | PRN
  Administered 2015-07-13: 1000 mL

## 2015-07-13 MED ORDER — SODIUM CHLORIDE 0.9 % IR SOLN
Status: DC | PRN
  Administered 2015-07-13: 3000 mL

## 2015-07-13 MED ORDER — TRANEXAMIC ACID 1000 MG/10ML IV SOLN
2000.0000 mg | Freq: Once | INTRAVENOUS | Status: AC
  Administered 2015-07-13: 2000 mg via TOPICAL
  Filled 2015-07-13: qty 20

## 2015-07-13 MED ORDER — PHENYLEPHRINE HCL 10 MG/ML IJ SOLN
INTRAMUSCULAR | Status: DC | PRN
  Administered 2015-07-13: 80 ug via INTRAVENOUS
  Administered 2015-07-13: 40 ug via INTRAVENOUS
  Administered 2015-07-13: 120 ug via INTRAVENOUS
  Administered 2015-07-13: 80 ug via INTRAVENOUS
  Administered 2015-07-13 (×2): 40 ug via INTRAVENOUS

## 2015-07-13 MED ORDER — PANTOPRAZOLE SODIUM 40 MG PO TBEC
40.0000 mg | DELAYED_RELEASE_TABLET | Freq: Every day | ORAL | Status: DC
  Administered 2015-07-14 – 2015-07-16 (×3): 40 mg via ORAL
  Filled 2015-07-13 (×4): qty 1

## 2015-07-13 MED ORDER — DICLOFENAC SODIUM 1 % TD GEL
2.0000 g | Freq: Four times a day (QID) | TRANSDERMAL | Status: DC
Start: 2015-07-13 — End: 2015-07-16
  Administered 2015-07-13 – 2015-07-16 (×10): 2 g via TOPICAL
  Filled 2015-07-13: qty 100

## 2015-07-13 MED ORDER — VITAMIN D (ERGOCALCIFEROL) 1.25 MG (50000 UNIT) PO CAPS: 50000.0000 [IU] | ORAL_CAPSULE | ORAL | Status: DC

## 2015-07-13 MED ORDER — MORPHINE SULFATE (PF) 4 MG/ML IV SOLN
INTRAVENOUS | Status: DC | PRN
  Administered 2015-07-13: 8 mg

## 2015-07-13 MED ORDER — FUROSEMIDE 20 MG PO TABS
20.0000 mg | ORAL_TABLET | Freq: Every day | ORAL | Status: DC
  Administered 2015-07-14 – 2015-07-16 (×2): 20 mg via ORAL
  Filled 2015-07-13 (×4): qty 1

## 2015-07-13 MED ORDER — PROPOFOL 10 MG/ML IV BOLUS
INTRAVENOUS | Status: DC | PRN
  Administered 2015-07-13: 10 mg via INTRAVENOUS
  Administered 2015-07-13: 20 mg via INTRAVENOUS

## 2015-07-13 MED ORDER — BUPIVACAINE HCL (PF) 0.5 % IJ SOLN
INTRAMUSCULAR | Status: DC | PRN
  Administered 2015-07-13: 3 mL via INTRATHECAL

## 2015-07-13 MED ORDER — METOCLOPRAMIDE HCL 5 MG PO TABS: 5.0000 mg | ORAL_TABLET | Freq: Three times a day (TID) | ORAL | Status: DC | PRN

## 2015-07-13 MED ORDER — MEPERIDINE HCL 25 MG/ML IJ SOLN: 6.2500 mg | INTRAMUSCULAR | Status: DC | PRN

## 2015-07-13 MED ORDER — GABAPENTIN 300 MG PO CAPS
300.0000 mg | ORAL_CAPSULE | Freq: Two times a day (BID) | ORAL | Status: DC
  Administered 2015-07-13 – 2015-07-16 (×5): 300 mg via ORAL
  Filled 2015-07-13 (×6): qty 1

## 2015-07-13 MED ORDER — BUPRENORPHINE 20 MCG/HR TD PTWK: 20.0000 ug | MEDICATED_PATCH | TRANSDERMAL | Status: DC

## 2015-07-13 MED ORDER — MIDAZOLAM HCL 2 MG/2ML IJ SOLN
INTRAMUSCULAR | Status: AC
  Filled 2015-07-13: qty 2

## 2015-07-13 MED ORDER — MORPHINE SULFATE (PF) 4 MG/ML IV SOLN
INTRAVENOUS | Status: AC
  Filled 2015-07-13: qty 2

## 2015-07-13 MED ORDER — ONDANSETRON HCL 4 MG PO TABS: 4.0000 mg | ORAL_TABLET | Freq: Four times a day (QID) | ORAL | Status: DC | PRN

## 2015-07-13 MED ORDER — PROPOFOL 1000 MG/100ML IV EMUL
INTRAVENOUS | Status: AC
  Filled 2015-07-13: qty 200

## 2015-07-13 MED ORDER — BUPIVACAINE HCL (PF) 0.25 % IJ SOLN
INTRAMUSCULAR | Status: AC
  Filled 2015-07-13: qty 30

## 2015-07-13 MED ORDER — PROPOFOL 500 MG/50ML IV EMUL
INTRAVENOUS | Status: DC | PRN
  Administered 2015-07-13: 75 ug/kg/min via INTRAVENOUS

## 2015-07-13 MED ORDER — ALBUTEROL SULFATE (2.5 MG/3ML) 0.083% IN NEBU
2.5000 mg | INHALATION_SOLUTION | Freq: Four times a day (QID) | RESPIRATORY_TRACT | Status: DC | PRN
  Administered 2015-07-15: 2.5 mg via RESPIRATORY_TRACT
  Filled 2015-07-13 (×2): qty 3

## 2015-07-13 MED ORDER — MIDAZOLAM HCL 5 MG/5ML IJ SOLN
INTRAMUSCULAR | Status: DC | PRN
Start: 2015-07-13 — End: 2015-07-13
  Administered 2015-07-13: 2 mg via INTRAVENOUS

## 2015-07-13 MED ORDER — HYDROMORPHONE HCL 1 MG/ML IJ SOLN
1.0000 mg | INTRAMUSCULAR | Status: DC | PRN
  Administered 2015-07-13 – 2015-07-15 (×6): 1 mg via INTRAVENOUS
  Filled 2015-07-13 (×6): qty 1

## 2015-07-13 MED ORDER — CLOBETASOL PROPIONATE 0.05 % EX CREA
1.0000 "application " | TOPICAL_CREAM | Freq: Two times a day (BID) | CUTANEOUS | Status: DC
  Administered 2015-07-14 – 2015-07-16 (×4): 1 via TOPICAL
  Filled 2015-07-13 (×2): qty 15

## 2015-07-13 MED ORDER — OXYCODONE HCL 5 MG PO TABS
5.0000 mg | ORAL_TABLET | ORAL | Status: DC | PRN
  Administered 2015-07-13 – 2015-07-16 (×5): 10 mg via ORAL
  Filled 2015-07-13 (×5): qty 2

## 2015-07-13 MED ORDER — SUVOREXANT 20 MG PO TABS: 20.0000 mg | ORAL_TABLET | Freq: Every day | ORAL | Status: DC

## 2015-07-13 MED ORDER — NORTRIPTYLINE HCL 25 MG PO CAPS
75.0000 mg | ORAL_CAPSULE | Freq: Every day | ORAL | Status: DC
  Administered 2015-07-13 – 2015-07-15 (×3): 75 mg via ORAL
  Filled 2015-07-13 (×3): qty 3

## 2015-07-13 MED ORDER — PHENOL 1.4 % MT LIQD: 1.0000 | OROMUCOSAL | Status: DC | PRN

## 2015-07-13 MED ORDER — ACETAMINOPHEN 650 MG RE SUPP: 650.0000 mg | Freq: Four times a day (QID) | RECTAL | Status: DC | PRN

## 2015-07-13 MED ORDER — LACTATED RINGERS IV SOLN
INTRAVENOUS | Status: DC
  Administered 2015-07-13 (×2): via INTRAVENOUS

## 2015-07-13 MED ORDER — PROMETHAZINE HCL 25 MG/ML IJ SOLN: 6.2500 mg | INTRAMUSCULAR | Status: DC | PRN

## 2015-07-13 MED ORDER — FENTANYL CITRATE (PF) 250 MCG/5ML IJ SOLN
INTRAMUSCULAR | Status: AC
  Filled 2015-07-13: qty 5

## 2015-07-13 MED ORDER — POTASSIUM CHLORIDE IN NACL 20-0.9 MEQ/L-% IV SOLN
INTRAVENOUS | Status: DC
  Administered 2015-07-13: 19:00:00 via INTRAVENOUS
  Filled 2015-07-13 (×2): qty 1000

## 2015-07-13 MED ORDER — TOPIRAMATE 25 MG PO TABS
50.0000 mg | ORAL_TABLET | Freq: Two times a day (BID) | ORAL | Status: DC
Start: 2015-07-13 — End: 2015-07-16
  Administered 2015-07-13 – 2015-07-16 (×6): 50 mg via ORAL
  Filled 2015-07-13 (×6): qty 2

## 2015-07-13 MED ORDER — METFORMIN HCL ER 500 MG PO TB24
500.0000 mg | ORAL_TABLET | Freq: Every day | ORAL | Status: DC
  Administered 2015-07-14 – 2015-07-16 (×3): 500 mg via ORAL
  Filled 2015-07-13 (×3): qty 1

## 2015-07-13 MED ORDER — MENTHOL 3 MG MT LOZG: 1.0000 | LOZENGE | OROMUCOSAL | Status: DC | PRN

## 2015-07-13 MED ORDER — PROPOFOL 10 MG/ML IV BOLUS
INTRAVENOUS | Status: AC
  Filled 2015-07-13: qty 20

## 2015-07-13 MED ORDER — METHOCARBAMOL 500 MG PO TABS
500.0000 mg | ORAL_TABLET | Freq: Two times a day (BID) | ORAL | Status: DC
  Administered 2015-07-13 – 2015-07-16 (×6): 500 mg via ORAL
  Filled 2015-07-13 (×6): qty 1

## 2015-07-13 MED ORDER — CLONIDINE HCL (ANALGESIA) 100 MCG/ML EP SOLN
150.0000 ug | Freq: Once | EPIDURAL | Status: AC
  Administered 2015-07-13: 1 mL via INTRA_ARTICULAR
  Filled 2015-07-13: qty 1.5

## 2015-07-13 MED ORDER — FENTANYL CITRATE (PF) 100 MCG/2ML IJ SOLN
INTRAMUSCULAR | Status: AC
  Administered 2015-07-13: 50 ug via INTRAVENOUS
  Filled 2015-07-13: qty 2

## 2015-07-13 SURGICAL SUPPLY — 80 items
BANDAGE ELASTIC 4 VELCRO ST LF (GAUZE/BANDAGES/DRESSINGS) ×3 IMPLANT
BANDAGE ESMARK 6X9 LF (GAUZE/BANDAGES/DRESSINGS) ×1 IMPLANT
BLADE SAG 18X100X1.27 (BLADE) ×3 IMPLANT
BLADE SAW SGTL 13.0X1.19X90.0M (BLADE) ×3 IMPLANT
BNDG COHESIVE 6X5 TAN STRL LF (GAUZE/BANDAGES/DRESSINGS) ×3 IMPLANT
BNDG ELASTIC 6X10 VLCR STRL LF (GAUZE/BANDAGES/DRESSINGS) ×6 IMPLANT
BNDG ESMARK 6X9 LF (GAUZE/BANDAGES/DRESSINGS) ×3
BOWL SMART MIX CTS (DISPOSABLE) ×3 IMPLANT
CAPT KNEE TOTAL 3 ×3 IMPLANT
CEMENT BONE SIMPLEX SPEEDSET (Cement) ×6 IMPLANT
CLOSURE WOUND 1/2 X4 (GAUZE/BANDAGES/DRESSINGS) ×1
COVER SURGICAL LIGHT HANDLE (MISCELLANEOUS) ×3 IMPLANT
CUFF TOURNIQUET SINGLE 34IN LL (TOURNIQUET CUFF) ×3 IMPLANT
CUFF TOURNIQUET SINGLE 44IN (TOURNIQUET CUFF) IMPLANT
DECANTER SPIKE VIAL GLASS SM (MISCELLANEOUS) ×3 IMPLANT
DRAPE INCISE IOBAN 66X45 STRL (DRAPES) IMPLANT
DRAPE ORTHO SPLIT 77X108 STRL (DRAPES) ×6
DRAPE SURG ORHT 6 SPLT 77X108 (DRAPES) ×3 IMPLANT
DRAPE U-SHAPE 47X51 STRL (DRAPES) ×3 IMPLANT
DRSG AQUACEL AG ADV 3.5X14 (GAUZE/BANDAGES/DRESSINGS) ×3 IMPLANT
DRSG MEPILEX BORDER 4X8 (GAUZE/BANDAGES/DRESSINGS) IMPLANT
DRSG PAD ABDOMINAL 8X10 ST (GAUZE/BANDAGES/DRESSINGS) IMPLANT
DURAPREP 26ML APPLICATOR (WOUND CARE) ×3 IMPLANT
ELECT REM PT RETURN 9FT ADLT (ELECTROSURGICAL) ×3
ELECTRODE REM PT RTRN 9FT ADLT (ELECTROSURGICAL) ×1 IMPLANT
FACESHIELD WRAPAROUND (MASK) ×3 IMPLANT
GAUZE SPONGE 4X4 12PLY STRL (GAUZE/BANDAGES/DRESSINGS) ×3 IMPLANT
GAUZE XEROFORM 1X8 LF (GAUZE/BANDAGES/DRESSINGS) IMPLANT
GAUZE XEROFORM 5X9 LF (GAUZE/BANDAGES/DRESSINGS) IMPLANT
GLOVE BIOGEL PI IND STRL 6.5 (GLOVE) ×2 IMPLANT
GLOVE BIOGEL PI IND STRL 7.5 (GLOVE) ×1 IMPLANT
GLOVE BIOGEL PI IND STRL 8 (GLOVE) ×2 IMPLANT
GLOVE BIOGEL PI INDICATOR 6.5 (GLOVE) ×4
GLOVE BIOGEL PI INDICATOR 7.5 (GLOVE) ×2
GLOVE BIOGEL PI INDICATOR 8 (GLOVE) ×4
GLOVE ECLIPSE 7.0 STRL STRAW (GLOVE) IMPLANT
GLOVE SURG ORTHO 8.0 STRL STRW (GLOVE) IMPLANT
GLOVE SURG SS PI 7.0 STRL IVOR (GLOVE) ×6 IMPLANT
GOWN STRL REUS W/ TWL LRG LVL3 (GOWN DISPOSABLE) ×5 IMPLANT
GOWN STRL REUS W/TWL 2XL LVL3 (GOWN DISPOSABLE) ×3 IMPLANT
GOWN STRL REUS W/TWL LRG LVL3 (GOWN DISPOSABLE) ×10
HANDPIECE INTERPULSE COAX TIP (DISPOSABLE)
HOOD PEEL AWAY FACE SHEILD DIS (HOOD) ×6 IMPLANT
IMMOBILIZER KNEE 20 (SOFTGOODS) IMPLANT
IMMOBILIZER KNEE 22 UNIV (SOFTGOODS) ×3 IMPLANT
IMMOBILIZER KNEE 24 THIGH 36 (MISCELLANEOUS) IMPLANT
IMMOBILIZER KNEE 24 UNIV (MISCELLANEOUS)
KIT BASIN OR (CUSTOM PROCEDURE TRAY) ×3 IMPLANT
KIT ROOM TURNOVER OR (KITS) ×3 IMPLANT
MANIFOLD NEPTUNE II (INSTRUMENTS) ×3 IMPLANT
NDL SAFETY ECLIPSE 18X1.5 (NEEDLE) ×1 IMPLANT
NEEDLE 18GX1X1/2 (RX/OR ONLY) (NEEDLE) ×3 IMPLANT
NEEDLE HYPO 18GX1.5 SHARP (NEEDLE) ×2
NEEDLE HYPO 22GX1.5 SAFETY (NEEDLE) ×6 IMPLANT
NEEDLE SPNL 18GX3.5 QUINCKE PK (NEEDLE) ×3 IMPLANT
NS IRRIG 1000ML POUR BTL (IV SOLUTION) ×6 IMPLANT
PACK TOTAL JOINT (CUSTOM PROCEDURE TRAY) ×3 IMPLANT
PACK UNIVERSAL I (CUSTOM PROCEDURE TRAY) ×3 IMPLANT
PAD ARMBOARD 7.5X6 YLW CONV (MISCELLANEOUS) ×6 IMPLANT
PAD CAST 4YDX4 CTTN HI CHSV (CAST SUPPLIES) ×1 IMPLANT
PADDING CAST COTTON 4X4 STRL (CAST SUPPLIES) ×2
PADDING CAST COTTON 6X4 STRL (CAST SUPPLIES) ×9 IMPLANT
SET HNDPC FAN SPRY TIP SCT (DISPOSABLE) IMPLANT
SPONGE LAP 18X18 X RAY DECT (DISPOSABLE) ×3 IMPLANT
STEM CEMENTED TRIATHLON (Stem) ×3 IMPLANT
STRIP CLOSURE SKIN 1/2X4 (GAUZE/BANDAGES/DRESSINGS) ×2 IMPLANT
SUCTION FRAZIER HANDLE 10FR (MISCELLANEOUS)
SUCTION FRAZIER TIP 10 FR DISP (SUCTIONS) ×3 IMPLANT
SUCTION TUBE FRAZIER 10FR DISP (MISCELLANEOUS) IMPLANT
SUT MNCRL AB 3-0 PS2 18 (SUTURE) ×3 IMPLANT
SUT VIC AB 0 CT1 27 (SUTURE) ×6
SUT VIC AB 0 CT1 27XBRD ANBCTR (SUTURE) ×3 IMPLANT
SUT VIC AB 1 CT1 27 (SUTURE) ×10
SUT VIC AB 1 CT1 27XBRD ANBCTR (SUTURE) ×5 IMPLANT
SUT VIC AB 2-0 CT1 27 (SUTURE) ×6
SUT VIC AB 2-0 CT1 TAPERPNT 27 (SUTURE) ×3 IMPLANT
SYR 30ML LL (SYRINGE) ×9 IMPLANT
SYR TB 1ML LUER SLIP (SYRINGE) ×3 IMPLANT
TOWEL OR 17X24 6PK STRL BLUE (TOWEL DISPOSABLE) ×6 IMPLANT
TOWEL OR 17X26 10 PK STRL BLUE (TOWEL DISPOSABLE) ×6 IMPLANT

## 2015-07-13 NOTE — Progress Notes (Signed)
Pt arrived to the unit from pacu via bed with IV intact and transfusing. Pt A&O x4; pt LLE incision has clean, dry and intact compression wrap dsg with ice applied and Knee immobilizer in place. Pt has open blister wound to left upper arm, anterior scalp and posterior scalp. Foam dsg applied to left arm. Pt state's it's from her sarcoidosis and has daily dsg changes to the wounds at home. Pt oriented to the unit and room; VSS; call light within reach; pt due to void per report from pacu. Will continue to closely monitor and report off to oncoming RN. Delia Heady RN

## 2015-07-13 NOTE — Transfer of Care (Signed)
Immediate Anesthesia Transfer of Care Note  Patient: [name]  Procedure(s) Performed: Procedure(s): LEFT TOTAL KNEE ARTHROPLASTY (Left)  Patient Location: PACU  Anesthesia Type:Spinal  Level of Consciousness: awake, alert  and oriented  Airway & Oxygen Therapy: Patient Spontanous Breathing and Patient connected to face mask oxygen  Post-op Assessment: Report given to RN and Post -op Vital signs reviewed and stable  Post vital signs: Reviewed and stable  Last Vitals:  Filed Vitals:   07/13/15 1027 07/13/15 1628  BP: 126/67 105/72  Pulse: 73 63  Temp: 36.2 C 36.4 C  Resp: 18 11    Last Pain:  Filed Vitals:   07/13/15 1635  PainSc: 8       Patients Stated Pain Goal: 5 ([id number])  Complications: No apparent anesthesia complications

## 2015-07-13 NOTE — H&P (Signed)
TOTAL KNEE ADMISSION H&P  Patient is being admitted for left total knee arthroplasty.  Subjective:  Chief Complaint:left knee pain.  HPI: [name], 55 y.o. female, has a history of pain and functional disability in the left knee due to arthritis and has failed non-surgical conservative treatments for greater than 12 weeks to includecorticosteriod injections, use of assistive devices and activity modification.  Onset of symptoms was gradual, starting 8 years ago with gradually worsening course since that time. The patient noted no past surgery on the left knee(s).  Patient currently rates pain in the left knee(s) at 9 out of 10 with activity. Patient has night pain, worsening of pain with activity and weight bearing, pain that interferes with activities of daily living, pain with passive range of motion, crepitus and joint swelling.  Patient has evidence of subchondral sclerosis and joint space narrowing by imaging studies. This patient has had good result with right knee replacement. There is no active infection.  Patient Active Problem List   Diagnosis Date Noted  . Diabetes ([place]) 02/01/2015  . Postoperative anemia due to acute blood loss 02/01/2015  . Arthritis of knee, degenerative 11/15/2014  . Calf pain 01/21/2014  . Insomnia 12/23/2013  . Leg ulcer, left ([place]) 09/13/2013  . Abnormal SPEP 06/04/2013  . Memory loss 04/27/2013  . Panic attacks 03/17/2013  . Right knee pain 12/29/2012  . Rash and nonspecific skin eruption 10/27/2012  . Dizziness and giddiness 09/07/2012  . Pulmonary nodules 07/20/2012  . CAD in native artery 05/28/2012  . Seborrheic dermatitis 01/20/2012  . Recurrent knee pain 09/03/2011  . Knee pain, left 08/03/2011  . Leg pain, left 07/17/2011  . Polydipsia 05/09/2011  . Abscess of axilla, left 08/10/2010  . Obesity 03/07/2010  . INSOMNIA, CHRONIC 03/07/2010  . NAUSEA 03/07/2010  . Avulsion fracture of tooth 03/07/2010  . ALLERGIC RHINITIS  10/26/2009  . GRIEF REACTION 08/01/2009  . DYSPNEA 04/28/2009  . MAXILLARY SINUSITIS 10/05/2008  . Essential hypertension 08/27/2007  . B12 deficiency 08/25/2007  . Vitamin D deficiency 08/25/2007  . ELEVATED BP 07/10/2007  . VISUAL CHANGES 07/02/2007  . UTI 02/07/2007  . ABDOMINAL PAIN RIGHT LOWER QUADRANT 02/07/2007  . SINUSITIS, ACUTE 02/02/2007  . Osteoarthritis 02/02/2007  . Sarcoidosis ([place]) 08/14/2006  . Anxiety state 08/14/2006  . Major depressive disorder, recurrent episode ([place]) 08/14/2006  . GERD 08/14/2006   Past Medical History  Diagnosis Date  . Osteoarthritis   . Sarcoidosis ([place])     Dr. Lolita Patella  . Anxiety   . Vitamin D deficiency     is supposed to take Vit D but can't afford it  . Allergic rhinitis   . ALLERGIC RHINITIS 10/26/2009  . ANXIETY 08/14/2006  . B12 DEFICIENCY 08/25/2007  . VITAMIN D DEFICIENCY 08/25/2007  . Hypertension     takes Coreg daily  . HYPERTENSION 08/27/2007  . DYSPNEA 04/28/2009    with exertion  . Shortness of breath   . Pneumonia     over 30 yrs ago  . Headache     last migraine 2-45yrs ago;takes Topamax daily  . Confusion   . Short-term memory loss   . Long-term memory loss   . TIA (transient ischemic attack)   . Joint pain   . Joint swelling   . Rheumatoid arthritis ([place])   . Osteoarthritis   . Lichen planus   . Insomnia     takes Nortriptyline nightly   . Esophageal reflux     takes Nexium daily  .  Depression     takes Lexapro daily  . DEPRESSION 08/14/2006  . Urinary urgency   . Nocturia   . Diabetes mellitus without complication ([place])     was on insulin but has been off since Nov 2015 and now only takes Metformin daily  . OSA (obstructive sleep apnea)     doesn't use CPAP;sleep study in epic from 2006  . History of shingles   . Fibromyalgia     Past Surgical History  Procedure Laterality Date  . Arthroscopic knee surgery Right 11-12-04  . Appendectomy    . Endometrial ablation    . Axillary abcess  irrigation and debridement  Jul & Aug2012  . Cyst removed from top of buttocks  at age 71  . Carpal tunnel release Left 05/23/2014    Procedure: CARPAL TUNNEL RELEASE;  Surgeon: Meredith Pel, MD;  Location: [facility];  Service: Orthopedics;  Laterality: Left;  . Total knee arthroplasty Right 11/15/2014    Procedure: TOTAL RIGHT KNEE ARTHROPLASTY;  Surgeon: Meredith Pel, MD;  Location: [facility];  Service: Orthopedics;  Laterality: Right;    No prescriptions prior to admission   Allergies  Allergen Reactions  . Enalapril Maleate     REACTION: cough  . Hydrochlorothiazide     REACTION: Low potassium  . Hydroxychloroquine Sulfate     REACTION: vision changes  . Iodine     Mother was intolerant--CODED on CT table---pt never tired.  . Latex     Hives  . Nickel Other (See Comments)    Pt has a titanium right knee - nickel causes skin irritations that forms into blisters and sores    Social History  Substance Use Topics  . Smoking status: Former Smoker -- 0.50 packs/day for 10 years    Types: Cigarettes  . Smokeless tobacco: Not on file     Comment: quit smoking in 2004  . Alcohol Use: No    Family History  Problem Relation Age of Onset  . Hypertension    . Coronary artery disease      female 1st degree relative <60  . Heart failure      congestive  . Coronary artery disease      Female 1st degree relative <50  . Breast cancer      1st degree relative <50 S  . Heart disease Mother   . Kidney disease Mother   . Cancer Father     leukemia     Review of Systems  Constitutional: Negative.   HENT: Negative.   Eyes: Negative.   Respiratory: Negative.   Cardiovascular: Negative.   Gastrointestinal: Negative.   Genitourinary: Negative.   Musculoskeletal: Positive for joint pain.  Skin: Negative.   Neurological: Negative.   Endo/Heme/Allergies: Negative.   Psychiatric/Behavioral: Negative.     Objective:  Physical Exam  Constitutional: She appears well-developed.   HENT:  Head: Normocephalic.  Eyes: Pupils are equal, round, and reactive to light.  Neck: Normal range of motion.  Cardiovascular: Normal rate.   Respiratory: Effort normal.  Neurological: She is alert.  Skin: Skin is warm.  Psychiatric: She has a normal mood and affect.  left knee skin intact - dp 2/4 - foot df pf ok - rom 10 - 105 - periretinacular tenderness - collaterals stable - ext mech intact  Vital signs in last 24 hours:    Labs:   Estimated body mass index is 38.33 kg/(m^2) as calculated from the following:   Height as of 04/25/15: 5'  7" (1.702 m).   Weight as of 04/25/15: 111.041 kg (244 lb 12.8 oz).   Imaging Review Plain radiographs demonstrate severe degenerative joint disease of the left knee(s). The overall alignment ismild varus. The bone quality appears to be good for age and reported activity level.  Assessment/Plan:  End stage arthritis, left knee   The patient history, physical examination, clinical judgment of the provider and imaging studies are consistent with end stage degenerative joint disease of the left knee(s) and total knee arthroplasty is deemed medically necessary. The treatment options including medical management, injection therapy arthroscopy and arthroplasty were discussed at length. The risks and benefits of total knee arthroplasty were presented and reviewed. The risks due to aseptic loosening, infection, stiffness, patella tracking problems, thromboembolic complications and other imponderables were discussed. The patient acknowledged the explanation, agreed to proceed with the plan and consent was signed. Patient is being admitted for inpatient treatment for surgery, pain control, PT, OT, prophylactic antibiotics, VTE prophylaxis, progressive ambulation and ADL's and discharge planning. The patient is planning to be discharged to skilled nursing facility

## 2015-07-13 NOTE — Anesthesia Postprocedure Evaluation (Signed)
Anesthesia Post Note  Patient: [name]  Procedure(s) Performed: Procedure(s) (LRB): LEFT TOTAL KNEE ARTHROPLASTY (Left)  Patient location during evaluation: PACU Anesthesia Type: Spinal and MAC Level of consciousness: awake and alert Pain management: pain level controlled Vital Signs Assessment: post-procedure vital signs reviewed and stable Respiratory status: spontaneous breathing and respiratory function stable Cardiovascular status: blood pressure returned to baseline and stable Postop Assessment: spinal receding Anesthetic complications: no    Last Vitals:  Filed Vitals:   07/13/15 1736 07/13/15 2044  BP: 130/73 111/69  Pulse: 65 82  Temp: 36.2 C 36.6 C  Resp: 15 16    Last Pain:  Filed Vitals:   07/13/15 2218  PainSc: 10-Worst pain ever                 Nolon Nations

## 2015-07-13 NOTE — Anesthesia Preprocedure Evaluation (Signed)
Anesthesia Evaluation  Patient identified by MRN, date of birth, ID band Patient awake    Reviewed: Allergy & Precautions, NPO status , Patient's Chart, lab work & pertinent test results, reviewed documented beta blocker date and time   Airway Mallampati: II  TM Distance: >3 FB Neck ROM: Full    Dental  (+) Dental Advisory Given, Partial Upper, Missing   Pulmonary shortness of breath, sleep apnea , pneumonia, former smoker,    Pulmonary exam normal breath sounds clear to auscultation       Cardiovascular hypertension, Pt. on medications and Pt. on home beta blockers (-) angina+ CAD  (-) Past MI Normal cardiovascular exam Rhythm:Regular Rate:Normal     Neuro/Psych  Headaches, PSYCHIATRIC DISORDERS Anxiety Depression TIA   GI/Hepatic Neg liver ROS, GERD  Medicated,  Endo/Other  diabetes, Type 2, Oral Hypoglycemic AgentsObesity   Renal/GU negative Renal ROS     Musculoskeletal  (+) Arthritis , Osteoarthritis,  Fibromyalgia -  Abdominal   Peds  Hematology negative hematology ROS (+) anemia ,   Anesthesia Other Findings Day of surgery medications reviewed with the patient.  Sarcoidosis. Took self off prednisone 2 yrs ago.  Reproductive/Obstetrics                             Anesthesia Physical  Anesthesia Plan  ASA: III  Anesthesia Plan: Spinal and MAC   Post-op Pain Management:    Induction: Intravenous  Airway Management Planned: Simple Face Mask  Additional Equipment:   Intra-op Plan:   Post-operative Plan:   Informed Consent: I have reviewed the patients History and Physical, chart, labs and discussed the procedure including the risks, benefits and alternatives for the proposed anesthesia with the patient or authorized representative who has indicated his/her understanding and acceptance.   Dental advisory given  Plan Discussed with: CRNA, Anesthesiologist and  Surgeon  Anesthesia Plan Comments: (Discussed risks and benefits of and differences between spinal and general. Discussed risks of spinal including headache, backache, failure, bleeding, infection, and nerve damage. Patient consents to spinal. Questions answered. Coagulation studies and platelet count acceptable.)        Anesthesia Quick Evaluation                                  Anesthesia Evaluation  Patient identified by MRN, date of birth, ID band Patient awake    Reviewed: Allergy & Precautions, NPO status , Patient's Chart, lab work & pertinent test results, reviewed documented beta blocker date and time   Airway Mallampati: II  TM Distance: >3 FB Neck ROM: Full    Dental  (+) Partial Upper, Dental Advisory Given   Pulmonary sleep apnea , former smoker,  Denies home O2 or CPAP   Pulmonary exam normal       Cardiovascular hypertension, Pt. on home beta blockers and Pt. on medications + CAD Normal cardiovascular exam    Neuro/Psych Anxiety Depression TIA   GI/Hepatic GERD-  ,  Endo/Other  diabetes, Well Controlled, Type 2, Oral Hypoglycemic AgentsMorbid obesity  Renal/GU      Musculoskeletal  (+) Arthritis -, Fibromyalgia -  Abdominal   Peds  Hematology   Anesthesia Other Findings Sarcoidosis.  Took self off prednisone 2 yrs ago.  Reproductive/Obstetrics  Anesthesia Physical Anesthesia Plan  ASA: III  Anesthesia Plan: General   Post-op Pain Management:    Induction: Intravenous  Airway Management Planned: LMA and Oral ETT  Additional Equipment:   Intra-op Plan:   Post-operative Plan: Extubation in OR  Informed Consent: I have reviewed the patients History and Physical, chart, labs and discussed the procedure including the risks, benefits and alternatives for the proposed anesthesia with the patient or authorized representative who has indicated his/her understanding and  acceptance.     Plan Discussed with: CRNA, Anesthesiologist and Surgeon  Anesthesia Plan Comments:         Anesthesia Quick Evaluation

## 2015-07-13 NOTE — Brief Op Note (Signed)
07/13/2015  4:35 PM  PATIENT:  [name]  55 y.o. female  PRE-OPERATIVE DIAGNOSIS:  LEFT KNEE OSTEOARTHRITIS  POST-OPERATIVE DIAGNOSIS:  LEFT KNEE OSTEOARTHRITIS  PROCEDURE:  Procedure(s): LEFT TOTAL KNEE ARTHROPLASTY  SURGEON:  Surgeon(s): Meredith Pel, MD  ASSISTANT: Laure Kidney RNFA  ANESTHESIA:   spinal  EBL: 50 ml    Total I/O In: 2800 [I.V.:2800] Out: -   BLOOD ADMINISTERED: none  DRAINS: none   LOCAL MEDICATIONS USED:  Marcaine mso4 clonidine exparel  SPECIMEN:  No Specimen  COUNTS:  YES  TOURNIQUET:   Total Tourniquet Time Documented: Thigh (Left) - 110 minutes Total: Thigh (Left) - 110 minutes   DICTATION: .Other Dictation: Dictation Number done  PLAN OF CARE: Admit to inpatient   PATIENT DISPOSITION:  PACU - hemodynamically stable

## 2015-07-13 NOTE — Anesthesia Procedure Notes (Addendum)
Procedure Name: MAC Date/Time: 07/13/2015 1:16 PM Performed by: Tressia Miners LEFFEW Pre-anesthesia Checklist: Patient identified, Emergency Drugs available, Suction available, Timeout performed and Patient being monitored Patient Re-evaluated:Patient Re-evaluated prior to inductionOxygen Delivery Method: Simple face mask Placement Confirmation: positive ETCO2   Spinal Patient location during procedure: OR Staffing Anesthesiologist: Nolon Nations Performed by: anesthesiologist  Preanesthetic Checklist Completed: patient identified, site marked, surgical consent, pre-op evaluation, timeout performed, IV checked, risks and benefits discussed and monitors and equipment checked Spinal Block Patient position: sitting Prep: ChloraPrep Patient monitoring: heart rate, continuous pulse ox and blood pressure Location: L3-4 Injection technique: single-shot Needle Needle type: Quincke  Needle gauge: 22 G Needle length: 9 cm Additional Notes Expiration date of kit checked and confirmed. Patient tolerated procedure well, without complications. Attempted with 24g sprotte, but unsuccessful

## 2015-07-13 NOTE — Progress Notes (Signed)
Orthopedic Tech Progress Note Patient Details:  [name] February 20, 1960 [id number]  Ortho Devices Ortho Device/Splint Location: footsie roll  Ortho Device/Splint Interventions: Ordered  Patient ID: [name], female   DOB: 05-28-1960, 55 y.o.   MRN: [id number]  Patient refused overhead frame, and already has knee immobilizer. [name] 07/13/2015, 5:51 PM

## 2015-07-13 NOTE — Progress Notes (Signed)
   07/13/15 1127  Clinical Encounter Type  Visited With Patient  Visit Type Initial;Pre-op;Spiritual support  Referral From Patient;Nurse  Spiritual Encounters  Spiritual Needs Prayer  Stress Factors  Patient Stress Factors Health changes   Chaplain responded to a request to pray with the patient prior to surgery. Chaplain offered prayer and support. Chaplain introduced spiritual care services. Spiritual care services available as needed.   Jeri Lager, Chaplain 07/13/2015 11:27 AM

## 2015-07-14 ENCOUNTER — Encounter (HOSPITAL_COMMUNITY): Payer: Self-pay | Admitting: Orthopedic Surgery

## 2015-07-14 LAB — GLUCOSE, CAPILLARY
GLUCOSE-CAPILLARY: 156 mg/dL — AB (ref 65–99)
GLUCOSE-CAPILLARY: 143 mg/dL — AB (ref 65–99)
Glucose-Capillary: 115 mg/dL — ABNORMAL HIGH (ref 65–99)
Glucose-Capillary: 184 mg/dL — ABNORMAL HIGH (ref 65–99)

## 2015-07-14 LAB — CBC
HCT: 39.4 % (ref 36.0–46.0)
Hemoglobin: 12.7 g/dL (ref 12.0–15.0)
MCH: 31.3 pg (ref 26.0–34.0)
MCHC: 32.2 g/dL (ref 30.0–36.0)
MCV: 97 fL (ref 78.0–100.0)
PLATELETS: 222 10*3/uL (ref 150–400)
RBC: 4.06 MIL/uL (ref 3.87–5.11)
RDW: 12.5 % (ref 11.5–15.5)
WBC: 8.6 10*3/uL (ref 4.0–10.5)

## 2015-07-14 LAB — BASIC METABOLIC PANEL
Anion gap: 8 (ref 5–15)
BUN: 15 mg/dL (ref 6–20)
CHLORIDE: 109 mmol/L (ref 101–111)
CO2: 22 mmol/L (ref 22–32)
Calcium: 8.6 mg/dL — ABNORMAL LOW (ref 8.9–10.3)
Creatinine, Ser: 1.09 mg/dL — ABNORMAL HIGH (ref 0.44–1.00)
GFR calc Af Amer: >60 mL/min (ref >60)
GFR, EST NON AFRICAN AMERICAN: 56 mL/min — AB (ref >60)
GLUCOSE: 118 mg/dL — AB (ref 65–99)
POTASSIUM: 4.1 mmol/L (ref 3.5–5.1)
Sodium: 139 mmol/L (ref 135–145)

## 2015-07-14 MED ORDER — OXYCODONE HCL ER 10 MG PO T12A: 10.0000 mg | EXTENDED_RELEASE_TABLET | Freq: Two times a day (BID) | ORAL | Status: DC

## 2015-07-14 MED ORDER — RIVAROXABAN 10 MG PO TABS: 10.0000 mg | ORAL_TABLET | Freq: Every day | ORAL | Status: DC

## 2015-07-14 MED ORDER — OXYCODONE HCL 5 MG PO TABS: 5.0000 mg | ORAL_TABLET | ORAL | Status: DC | PRN

## 2015-07-14 MED ORDER — OXYCODONE HCL ER 10 MG PO T12A
10.0000 mg | EXTENDED_RELEASE_TABLET | Freq: Two times a day (BID) | ORAL | Status: DC
  Administered 2015-07-14 – 2015-07-16 (×5): 10 mg via ORAL
  Filled 2015-07-14 (×5): qty 1

## 2015-07-14 NOTE — Progress Notes (Signed)
Subjective: Pt stable but having some pain   Objective: Vital signs in last 24 hours: Temp:  [97.1 F (36.2 C)-99.5 F (37.5 C)] 99.5 F (37.5 C) (06/30 0538) Pulse Rate:  [63-103] 103 (06/30 0538) Resp:  [10-19] 16 (06/30 0538) BP: (105-137)/(67-93) 136/75 mmHg (06/30 0538) SpO2:  [92 %-100 %] 92 % (06/30 0538) Weight:  [119.568 kg (263 lb 9.6 oz)] 119.568 kg (263 lb 9.6 oz) (06/29 1027)  Intake/Output from previous day: 06/29 0701 - 06/30 0700 In: 2920 [P.O.:120; I.V.:2800] Out: 1275 [Urine:1025; Blood:250] Intake/Output this shift:    Exam:  Sensation intact distally Intact pulses distally Dorsiflexion/Plantar flexion intact  Labs: No results for input(s): HGB in the last 72 hours. No results for input(s): WBC, RBC, HCT, PLT in the last 72 hours. No results for input(s): NA, K, CL, CO2, BUN, CREATININE, GLUCOSE, CALCIUM in the last 72 hours. No results for input(s): LABPT, INR in the last 72 hours.  Assessment/Plan: Plan cpm and pt today - add oxy er - possible dc to snf sat - rx and dc summary done   DEAN,GREGORY SCOTT 07/14/2015, 8:56 AM

## 2015-07-14 NOTE — Discharge Summary (Addendum)
Physician Discharge Summary  Patient ID: [name] MRN: [id number] DOB/AGE: 55-27-1962 55 y.o.  Admit date: 07/13/2015 Discharge date: 07/16/2015  Admission Diagnoses:  Active Problems:   Arthritis of knee, degenerative   Discharge Diagnoses:  Same  Surgeries: Procedure(s): LEFT TOTAL KNEE ARTHROPLASTY on 07/13/2015   Consultants:    Discharged Condition: Stable  Hospital Course: [name] is an 55 y.o. female who was admitted 07/13/2015 with a chief complaint of left knee pain, and found to have a diagnosis of knee arthritis.  They were brought to the operating room on 07/13/2015 and underwent the above named procedures. Did well with PT and transferred to snf pod 2 in good condition on xarelto   Antibiotics given:      Anti-infectives    Start     Dose/Rate Route Frequency Ordered Stop   07/13/15 1930  ceFAZolin (ANCEF) IVPB 2g/100 mL premix     2 g 200 mL/hr over 30 Minutes Intravenous Every 6 hours 07/13/15 1747 07/14/15 0106   07/13/15 1030  ceFAZolin (ANCEF) IVPB 2g/100 mL premix     2 g 200 mL/hr over 30 Minutes Intravenous To ShortStay Surgical 07/12/15 1052 07/13/15 1319    .  Recent vital signs:  Filed Vitals:   07/15/15 1928 07/16/15 0636  BP: 130/78 100/60  Pulse: 86 92  Temp:    Resp:      Recent laboratory studies:  Results for orders placed or performed during the hospital encounter of 07/13/15  Glucose, capillary  Result Value Ref Range   Glucose-Capillary 95 65 - 99 mg/dL  Glucose, capillary  Result Value Ref Range   Glucose-Capillary 101 (H) 65 - 99 mg/dL   Comment 1 Notify RN    Comment 2 Document in Chart   Glucose, capillary  Result Value Ref Range   Glucose-Capillary 81 65 - 99 mg/dL  CBC  Result Value Ref Range   WBC 8.6 4.0 - 10.5 K/uL   RBC 4.06 3.87 - 5.11 MIL/uL   Hemoglobin 12.7 12.0 - 15.0 g/dL   HCT 39.4 36.0 - 46.0 %   MCV 97.0 78.0 - 100.0 fL   MCH 31.3 26.0 - 34.0 pg   MCHC 32.2 30.0 - 36.0 g/dL    RDW 12.5 11.5 - 15.5 %   Platelets 222 150 - 400 K/uL  Basic metabolic panel  Result Value Ref Range   Sodium 139 135 - 145 mmol/L   Potassium 4.1 3.5 - 5.1 mmol/L   Chloride 109 101 - 111 mmol/L   CO2 22 22 - 32 mmol/L   Glucose, Bld 118 (H) 65 - 99 mg/dL   BUN 15 6 - 20 mg/dL   Creatinine, Ser 1.09 (H) 0.44 - 1.00 mg/dL   Calcium 8.6 (L) 8.9 - 10.3 mg/dL   GFR calc non Af Amer 56 (L) >60 mL/min   GFR calc Af Amer >60 >60 mL/min   Anion gap 8 5 - 15  Glucose, capillary  Result Value Ref Range   Glucose-Capillary 96 65 - 99 mg/dL  Glucose, capillary  Result Value Ref Range   Glucose-Capillary 115 (H) 65 - 99 mg/dL  Glucose, capillary  Result Value Ref Range   Glucose-Capillary 184 (H) 65 - 99 mg/dL   Comment 1 Notify RN    Comment 2 Document in Chart   Glucose, capillary  Result Value Ref Range   Glucose-Capillary 156 (H) 65 - 99 mg/dL   Comment 1 Notify RN    Comment 2 Document  in Chart   Glucose, capillary  Result Value Ref Range   Glucose-Capillary 143 (H) 65 - 99 mg/dL  Glucose, capillary  Result Value Ref Range   Glucose-Capillary 115 (H) 65 - 99 mg/dL  Glucose, capillary  Result Value Ref Range   Glucose-Capillary 137 (H) 65 - 99 mg/dL  Glucose, capillary  Result Value Ref Range   Glucose-Capillary 137 (H) 65 - 99 mg/dL  Glucose, capillary  Result Value Ref Range   Glucose-Capillary 129 (H) 65 - 99 mg/dL  Glucose, capillary  Result Value Ref Range   Glucose-Capillary 105 (H) 65 - 99 mg/dL    Discharge Medications:     Medication List    STOP taking these medications        predniSONE 10 MG tablet  Commonly known as:  DELTASONE      TAKE these medications        albuterol 108 (90 Base) MCG/ACT inhaler  Commonly known as:  PROVENTIL HFA;VENTOLIN HFA  Inhale 2 puffs into the lungs every 6 (six) hours as needed for wheezing or shortness of breath.     BUTRANS 20 MCG/HR Ptwk patch  Generic drug:  buprenorphine  Place 1 patch onto the skin every  7 (seven) days.     carvedilol 12.5 MG tablet  Commonly known as:  COREG  Take 1 tablet (12.5 mg total) by mouth 2 (two) times daily with a meal.     chlorhexidine 0.12 % solution  Commonly known as:  PERIDEX  Use as directed 5 mLs in the mouth or throat 2 (two) times daily as needed.     clobetasol 0.05 % external solution  Commonly known as:  TEMOVATE  Apply 1 application topically 2 (two) times daily.     ergocalciferol 50000 units capsule  Commonly known as:  VITAMIN D2  Take 1 capsule (50,000 Units total) by mouth every 30 (thirty) days.     escitalopram 20 MG tablet  Commonly known as:  LEXAPRO  TAKE 1 TABLET BY MOUTH DAILY     esomeprazole 40 MG capsule  Commonly known as:  NEXIUM  TAKE 1 CAPSULE BY MOUTH EVERY DAY BEFORE BREAKFAST     freestyle lancets  1 each by Other route 2 (two) times daily. Use as instructed     FREESTYLE LITE Devi  Use to check blood sugar 2 times per day     furosemide 20 MG tablet  Commonly known as:  LASIX  Take 1 tablet (20 mg total) by mouth daily.     gabapentin 300 MG capsule  Commonly known as:  NEURONTIN  Take 300 mg by mouth 2 (two) times daily.     glucose blood test strip  Commonly known as:  FREESTYLE LITE  TEST TWICE DAILY     hydroquinone 4 % cream  APPLY TO THE DARK SPOTS ON THE SKIN QHS FOR 3 MONTHS AS DIRECTED     KLOR-CON 10 10 MEQ tablet  Generic drug:  potassium chloride  TAKE 1 TABLET BY MOUTH DAILY     metFORMIN 500 MG 24 hr tablet  Commonly known as:  GLUCOPHAGE-XR  Take 2 tablets (1,000 mg total) by mouth daily with breakfast.     methocarbamol 500 MG tablet  Commonly known as:  ROBAXIN  Take 500 mg by mouth 2 (two) times daily.     nortriptyline 75 MG capsule  Commonly known as:  PAMELOR  Take 1 capsule (75 mg total) by mouth at bedtime.  oxyCODONE 5 MG immediate release tablet  Commonly known as:  Oxy IR/ROXICODONE  Take 1-2 tablets (5-10 mg total) by mouth every 3 (three) hours as needed for  breakthrough pain.     oxyCODONE 10 mg 12 hr tablet  Commonly known as:  OXYCONTIN  Take 1 tablet (10 mg total) by mouth every 12 (twelve) hours.     rivaroxaban 10 MG Tabs tablet  Commonly known as:  XARELTO  Take 1 tablet (10 mg total) by mouth daily with breakfast.     Suvorexant 20 MG Tabs  Commonly known as:  BELSOMRA  Take 20 mg by mouth at bedtime.     topiramate 50 MG tablet  Commonly known as:  TOPAMAX  TAKE 1 TABLET BY MOUTH TWICE DAILY     VOLTAREN 1 % Gel  Generic drug:  diclofenac sodium  APPLY 1 GM  TOPICAL TWICE DAILY        Diagnostic Studies: No results found.  Disposition: 03-Skilled Nursing Facility  Discharge Instructions    Call MD / Call 911    Complete by:  As directed   If you experience chest pain or shortness of breath, CALL 911 and be transported to the hospital emergency room.  If you develope a fever above 101 F, pus (white drainage) or increased drainage or redness at the wound, or calf pain, call your surgeon's office.     Constipation Prevention    Complete by:  As directed   Drink plenty of fluids.  Prune juice may be helpful.  You may use a stool softener, such as Colace (over the counter) 100 mg twice a day.  Use MiraLax (over the counter) for constipation as needed.     Diet - low sodium heart healthy    Complete by:  As directed      Discharge instructions    Complete by:  As directed   Weight bearing as tolerated cpm 4 hours daily Dressing waterproof ok to shower INSTRUCTIONS AFTER JOINT REPLACEMENT   Remove items at home which could result in a fall. This includes throw rugs or furniture in walking pathways ICE to the affected joint every three hours while awake for 30 minutes at a time, for at least the first 3-5 days, and then as needed for pain and swelling.  Continue to use ice for pain and swelling. You may notice swelling that will progress down to the foot and ankle.  This is normal after surgery.  Elevate your leg when you  are not up walking on it.   Continue to use the breathing machine you got in the hospital (incentive spirometer) which will help keep your temperature down.  It is common for your temperature to cycle up and down following surgery, especially at night when you are not up moving around and exerting yourself.  The breathing machine keeps your lungs expanded and your temperature down.   DIET:  As you were doing prior to hospitalization, we recommend a well-balanced diet.  DRESSING / WOUND CARE / SHOWERING  Keep the surgical dressing until follow up.  The dressing is water proof, so you can shower without any extra covering.  IF THE DRESSING FALLS OFF or the wound gets wet inside, change the dressing with sterile gauze.  Please use good hand washing techniques before changing the dressing.  Do not use any lotions or creams on the incision until instructed by your surgeon.    ACTIVITY  Increase activity slowly as tolerated, but follow  the weight bearing instructions below.   No driving for 6 weeks or until further direction given by your physician.  You cannot drive while taking narcotics.  No lifting or carrying greater than 10 lbs. until further directed by your surgeon. Avoid periods of inactivity such as sitting longer than an hour when not asleep. This helps prevent blood clots.  You may return to work once you are authorized by your doctor.     WEIGHT BEARING   Weight bearing as tolerated with assist device (walker, cane, etc) as directed, use it as long as suggested by your surgeon or therapist, typically at least 4-6 weeks.   EXERCISES  Results after joint replacement surgery are often greatly improved when you follow the exercise, range of motion and muscle strengthening exercises prescribed by your doctor. Safety measures are also important to protect the joint from further injury. Any time any of these exercises cause you to have increased pain or swelling, decrease what you are  doing until you are comfortable again and then slowly increase them. If you have problems or questions, call your caregiver or physical therapist for advice.   Rehabilitation is important following a joint replacement. After just a few days of immobilization, the muscles of the leg can become weakened and shrink (atrophy).  These exercises are designed to build up the tone and strength of the thigh and leg muscles and to improve motion. Often times heat used for twenty to thirty minutes before working out will loosen up your tissues and help with improving the range of motion but do not use heat for the first two weeks following surgery (sometimes heat can increase post-operative swelling).   These exercises can be done on a training (exercise) mat, on the floor, on a table or on a bed. Use whatever works the best and is most comfortable for you.    Use music or television while you are exercising so that the exercises are a pleasant break in your day. This will make your life better with the exercises acting as a break in your routine that you can look forward to.   Perform all exercises about fifteen times, three times per day or as directed.  You should exercise both the operative leg and the other leg as well.   Exercises include:   Quad Sets - Tighten up the muscle on the front of the thigh (Quad) and hold for 5-10 seconds.   Straight Leg Raises - With your knee straight (if you were given a brace, keep it on), lift the leg to 60 degrees, hold for 3 seconds, and slowly lower the leg.  Perform this exercise against resistance later as your leg gets stronger.  Leg Slides: Lying on your back, slowly slide your foot toward your buttocks, bending your knee up off the floor (only go as far as is comfortable). Then slowly slide your foot back down until your leg is flat on the floor again.  Angel Wings: Lying on your back spread your legs to the side as far apart as you can without causing discomfort.   Hamstring Strength:  Lying on your back, push your heel against the floor with your leg straight by tightening up the muscles of your buttocks.  Repeat, but this time bend your knee to a comfortable angle, and push your heel against the floor.  You may put a pillow under the heel to make it more comfortable if necessary.   A rehabilitation program following joint replacement  surgery can speed recovery and prevent re-injury in the future due to weakened muscles. Contact your doctor or a physical therapist for more information on knee rehabilitation.    CONSTIPATION  Constipation is defined medically as fewer than three stools per week and severe constipation as less than one stool per week.  Even if you have a regular bowel pattern at home, your normal regimen is likely to be disrupted due to multiple reasons following surgery.  Combination of anesthesia, postoperative narcotics, change in appetite and fluid intake all can affect your bowels.   YOU MUST use at least one of the following options; they are listed in order of increasing strength to get the job done.  They are all available over the counter, and you may need to use some, POSSIBLY even all of these options:    Drink plenty of fluids (prune juice may be helpful) and high fiber foods Colace 100 mg by mouth twice a day  Senokot for constipation as directed and as needed Dulcolax (bisacodyl), take with full glass of water  Miralax (polyethylene glycol) once or twice a day as needed.  If you have tried all these things and are unable to have a bowel movement in the first 3-4 days after surgery call either your surgeon or your primary doctor.    If you experience loose stools or diarrhea, hold the medications until you stool forms back up.  If your symptoms do not get better within 1 week or if they get worse, check with your doctor.  If you experience "the worst abdominal pain ever" or develop nausea or vomiting, please contact the office  immediately for further recommendations for treatment.   ITCHING:  If you experience itching with your medications, try taking only a single pain pill, or even half a pain pill at a time.  You can also use Benadryl over the counter for itching or also to help with sleep.   TED HOSE STOCKINGS:  Use stockings on both legs until for at least 2 weeks or as directed by physician office. They may be removed at night for sleeping.  MEDICATIONS:  See your medication summary on the "After Visit Summary" that nursing will review with you.  You may have some home medications which will be placed on hold until you complete the course of blood thinner medication.  It is important for you to complete the blood thinner medication as prescribed.  PRECAUTIONS:  If you experience chest pain or shortness of breath - call 911 immediately for transfer to the hospital emergency department.   If you develop a fever greater that 101 F, purulent drainage from wound, increased redness or drainage from wound, foul odor from the wound/dressing, or calf pain - CONTACT YOUR SURGEON.                                                   FOLLOW-UP APPOINTMENTS:  If you do not already have a post-op appointment, please call the office for an appointment to be seen by your surgeon.  Guidelines for how soon to be seen are listed in your "After Visit Summary", but are typically between 1-4 weeks after surgery.  OTHER INSTRUCTIONS:   Knee Replacement:  Do not place pillow under knee, focus on keeping the knee straight while resting. CPM instructions: 0-90 degrees, 2 hours in  the morning, 2 hours in the afternoon, and 2 hours in the evening. Place foam block, curve side up under heel at all times except when in CPM or when walking.  DO NOT modify, tear, cut, or change the foam block in any way.  MAKE SURE YOU:  Understand these instructions.  Get help right away if you are not doing well or get worse.    Thank you for letting us be a  part of your medical care team.  It is a privilege we respect greatly.  We hope these instructions will help you stay on track for a fast and full recovery!     Increase activity slowly as tolerated    Complete by:  As directed               Signed: Marianna Payment 07/16/2015, 8:38 AM

## 2015-07-14 NOTE — Progress Notes (Signed)
Orthopedic Tech Progress Note Patient Details:  [name] [facility] 11-Sep-1960 [id number]  CPM Left Knee CPM Left Knee: On Left Knee Flexion (Degrees): 35 Left Knee Extension (Degrees): 0   [name] 07/14/2015, 6:39 PM

## 2015-07-14 NOTE — Progress Notes (Signed)
Pt will DC to [facility] on Sunday (7/2)  CSW will continue to follow  Domenica Reamer, [place] Social Worker [phone]

## 2015-07-14 NOTE — Op Note (Signed)
NAMETINLEIGH, LEIFER NO.:  [id number]  MEDICAL RECORD NO.:  [id number]  LOCATION:  5N27C                        FACILITY:  [facility]  PHYSICIAN:  Anderson Malta, M.D.    DATE OF BIRTH:  February 07, 1960  DATE OF PROCEDURE: DATE OF DISCHARGE:                              OPERATIVE REPORT   PREOPERATIVE DIAGNOSIS:  Left knee arthritis.  POSTOPERATIVE DIAGNOSIS:  Left knee arthritis.  PROCEDURE:  Left total knee replacement utilizing [employer]ryker components, posterior cruciate sacrificing 3 tibia, 3 femur, 11-mm polyethylene insert, 29-mm 3 pegged cemented patella.  SURGEON:  Anderson Malta, M.D.  ASSISTANT:  Laure Kidney, RNFA.  INDICATIONS:  Frimy is a 55 year old patient with left knee pain and arthritis, presents for operative management after explanation of risks and benefits.  PROCEDURE IN DETAIL:  The patient was brought to the operating room where spinal anesthetic was induced.  Preoperative antibiotics were administered.  Time-out was called.  Left leg was prescrubbed with alcohol and Betadine, allowed to air dry, prepped with DuraPrep solution and draped in a sterile manner.  [name] was used to cover the operative field.  Leg was elevated and exsanguinated with the Esmarch wrap. Tourniquet was inflated to 300 mmHg, 110 minutes.  Anterior approach to the knee was made.  Skin and subcutaneous tissue were sharply divided. Median parapatellar approach was made and marked with a #1 Vicryl suture.  Soft tissue removed from the anterior distal femur along with the partial excision of the fat pad anterolaterally.  Soft tissue dissection performed medially.  Lateral patellofemoral ligament was released.  At this time, intramedullary alignment was utilized to make a cut perpendicular to the mechanical axis off the tibia.  With the collaterals and posterior neurovascular structures protected, the patient had generally symmetric wear on both the medial and lateral tibial  plateaus.  Initially, a 4-mm cut was made off the what appeared to be the most affected and most easily accessible medial tibial plateau.  This had to be revisited with an additional 4-mm cut for total of 8 mm off the affected medial tibial plateau for total 10-mm cut.  The intramedullary alignment was then used to cut the femur in 5 degrees of valgus with 10 mm resection due to preop flexion contracture of about 10- 15 degrees.  9-mm spacer fit at this.  Chamfer and box cuts were made. Tibia was keel punched to accept 50-mm stem.  Patella was then prepared, cutting down from 22 to 13.  Patellar trial was placed.  Other trials were placed.  The patient had good flexion and extension with good patellar tracking using an 11-mm spacer.  Trial components were removed. Thorough irrigation performed with 3 liters of irrigating solution. Exparel was then injected into the capsule and soft tissues.  Tranexamic acid then applied topically for 3 minutes.  This was then removed. Components were cemented into position with the same stability parameters maintained, excellent stability at 0, 30, and 90 degrees to varus and valgus stress with excellent patellar tracking.  At this time, excess cement was removed.  Cement was allowed to harden.  Then, the tourniquet was released.  Bleeding points were encountered and controlled using electrocautery.  Incision was closed using #1 Vicryl  suture, reapproximating the arthrotomy with the knee bent over a bolster followed by 0 Vicryl suture, 2-0 Vicryl suture, and a 3-0 Monocryl.  Solution of Marcaine, morphine, and clonidine injected intra- articular at the end.  The patient tolerated the procedure without immediate complications.  Bulky dressing, knee immobilizer placed.     Anderson Malta, M.D.     GSD/MEDQ  D:  07/13/2015  T:  07/14/2015  Job:  [id number]

## 2015-07-14 NOTE — NC FL2 (Signed)
[place] LEVEL OF CARE SCREENING TOOL     IDENTIFICATION  Patient Name: [name] Birthdate: 1960-08-05 Sex: female Admission Date (Current Location): 07/13/2015  [facility] and [place] Number:  Herbalist and Address:  [facility] [place], [place] [address], [place], [place] [address]      Provider Number: [id number]  Attending Physician Name and Address:  Meredith Pel, MD  Relative Name and Phone Number:       Current Level of Care: Hospital Recommended Level of Care: [place] Prior Approval Number:    Date Approved/Denied:   PASRR Number: [id number]  Discharge Plan: SNF    Current Diagnoses: Patient Active Problem List   Diagnosis Date Noted  . Diabetes ([place]) 02/01/2015  . Postoperative anemia due to acute blood loss 02/01/2015  . Arthritis of knee, degenerative 11/15/2014  . Calf pain 01/21/2014  . Insomnia 12/23/2013  . Leg ulcer, left ([place]) 09/13/2013  . Abnormal SPEP 06/04/2013  . Memory loss 04/27/2013  . Panic attacks 03/17/2013  . Right knee pain 12/29/2012  . Rash and nonspecific skin eruption 10/27/2012  . Dizziness and giddiness 09/07/2012  . Pulmonary nodules 07/20/2012  . CAD in native artery 05/28/2012  . Seborrheic dermatitis 01/20/2012  . Recurrent knee pain 09/03/2011  . Knee pain, left 08/03/2011  . Leg pain, left 07/17/2011  . Polydipsia 05/09/2011  . Abscess of axilla, left 08/10/2010  . Obesity 03/07/2010  . INSOMNIA, CHRONIC 03/07/2010  . NAUSEA 03/07/2010  . Avulsion fracture of tooth 03/07/2010  . ALLERGIC RHINITIS 10/26/2009  . GRIEF REACTION 08/01/2009  . DYSPNEA 04/28/2009  . MAXILLARY SINUSITIS 10/05/2008  . Essential hypertension 08/27/2007  . B12 deficiency 08/25/2007  . Vitamin D deficiency 08/25/2007  . ELEVATED BP 07/10/2007  . VISUAL CHANGES 07/02/2007  . UTI 02/07/2007  . ABDOMINAL PAIN RIGHT LOWER QUADRANT 02/07/2007  . SINUSITIS, ACUTE  02/02/2007  . Osteoarthritis 02/02/2007  . Sarcoidosis ([place]) 08/14/2006  . Anxiety state 08/14/2006  . Major depressive disorder, recurrent episode ([place]) 08/14/2006  . GERD 08/14/2006    Orientation RESPIRATION BLADDER Height & Weight     Self, Time, Situation, Place  Normal Continent Weight: 119.568 kg (263 lb 9.6 oz) Height:     BEHAVIORAL SYMPTOMS/MOOD NEUROLOGICAL BOWEL NUTRITION STATUS      Continent Diet (see DC)  AMBULATORY STATUS COMMUNICATION OF NEEDS Skin   Extensive Assist Verbally Surgical wounds                       Personal Care Assistance Level of Assistance  Bathing, Dressing Bathing Assistance: Maximum assistance   Dressing Assistance: Maximum assistance     Functional Limitations Info             SPECIAL CARE FACTORS FREQUENCY  PT (By licensed PT), OT (By licensed OT)     PT Frequency: 5/wk OT Frequency: 5/wk            Contractures      Additional Factors Info  Code Status, Allergies Code Status Info: FULL Allergies Info: Enalapril Maleate, Hydrochlorothiazide, Hydroxychloroquine Sulfate, Iodine, Latex, Nickel           Current Medications (07/14/2015):  This is the current hospital active medication list Current Facility-Administered Medications  Medication Dose Route Frequency Provider Last Rate Last Dose  . 0.9 % NaCl with KCl 20 mEq/ L  infusion   Intravenous Continuous Meredith Pel, MD 50 mL/hr at 07/13/15 1850    .  acetaminophen (TYLENOL) tablet 650 mg  650 mg Oral Q6H PRN Meredith Pel, MD       Or  . acetaminophen (TYLENOL) suppository 650 mg  650 mg Rectal Q6H PRN Meredith Pel, MD      . albuterol (PROVENTIL) (2.5 MG/3ML) 0.083% nebulizer solution 2.5 mg  2.5 mg Inhalation Q6H PRN Meredith Pel, MD      . carvedilol (COREG) tablet 12.5 mg  12.5 mg Oral BID WC Meredith Pel, MD   12.5 mg at 07/14/15 1006  . clobetasol cream (TEMOVATE) [id number] % 1 application  1 application Topical BID Meredith Pel, MD      . diclofenac sodium (VOLTAREN) 1 % transdermal gel 2 g  2 g Topical QID Meredith Pel, MD   2 g at 07/14/15 1005  . escitalopram (LEXAPRO) tablet 20 mg  20 mg Oral Daily Meredith Pel, MD   20 mg at 07/14/15 1005  . furosemide (LASIX) tablet 20 mg  20 mg Oral Daily Meredith Pel, MD   20 mg at 07/14/15 1006  . gabapentin (NEURONTIN) capsule 300 mg  300 mg Oral BID Meredith Pel, MD   300 mg at 07/13/15 2202  . HYDROmorphone (DILAUDID) injection 1 mg  1 mg Intravenous Q3H PRN Meredith Pel, MD   1 mg at 07/14/15 0143  . menthol-cetylpyridinium (CEPACOL) lozenge 3 mg  1 lozenge Oral PRN Meredith Pel, MD       Or  . phenol (CHLORASEPTIC) mouth spray 1 spray  1 spray Mouth/Throat PRN Meredith Pel, MD      . metFORMIN (GLUCOPHAGE-XR) 24 hr tablet 500 mg  500 mg Oral Q breakfast Meredith Pel, MD   500 mg at 07/14/15 1007  . methocarbamol (ROBAXIN) tablet 500 mg  500 mg Oral BID Meredith Pel, MD   500 mg at 07/14/15 1007  . metoCLOPramide (REGLAN) tablet 5-10 mg  5-10 mg Oral Q8H PRN Meredith Pel, MD       Or  . metoCLOPramide (REGLAN) injection 5-10 mg  5-10 mg Intravenous Q8H PRN Meredith Pel, MD      . nortriptyline (PAMELOR) capsule 75 mg  75 mg Oral QHS Meredith Pel, MD   75 mg at 07/13/15 2204  . ondansetron (ZOFRAN) tablet 4 mg  4 mg Oral Q6H PRN Meredith Pel, MD       Or  . ondansetron [facility]) injection 4 mg  4 mg Intravenous Q6H PRN Meredith Pel, MD      . oxyCODONE (Oxy IR/ROXICODONE) immediate release tablet 5-10 mg  5-10 mg Oral Q3H PRN Meredith Pel, MD   10 mg at 07/14/15 0042  . oxyCODONE (OXYCONTIN) 12 hr tablet 10 mg  10 mg Oral Q12H Meredith Pel, MD   10 mg at 07/14/15 1005  . pantoprazole (PROTONIX) EC tablet 40 mg  40 mg Oral Daily Meredith Pel, MD   40 mg at 07/14/15 1007  . potassium chloride (K-DUR) CR tablet 10 mEq  10 mEq Oral Daily Meredith Pel, MD   10 mEq at  07/14/15 1007  . rivaroxaban (XARELTO) tablet 10 mg  10 mg Oral Q breakfast Meredith Pel, MD   10 mg at 07/14/15 1007  . topiramate (TOPAMAX) tablet 50 mg  50 mg Oral BID Meredith Pel, MD   50 mg at 07/14/15 1006  . [START ON 08/09/2015] Vitamin D (Ergocalciferol) (DRISDOL) capsule  50,000 Units  50,000 Units Oral Q30 days Meredith Pel, MD         Discharge Medications: Please see discharge summary for a list of discharge medications.  Relevant Imaging Results:  Relevant Lab Results:   Additional Information SS#: [id number]  [place] Mon, [place]

## 2015-07-14 NOTE — Progress Notes (Signed)
Physical Therapy Treatment Patient Details Name: [name] MRN: [id number] DOB: 18-Nov-1960 Today's Date: 07/14/2015    History of Present Illness 55 yo female s/p L TKA 07/13/15. Hx of DM, lichen planus, sarcoidosis, OA, OSA.     PT Comments    Pt declined OOB with PT this session due to pain, fatigue. She did agree to bed level ROM exercises. Continue to recommend SNF.   Follow Up Recommendations  SNF     Equipment Recommendations       Recommendations for Other Services       Precautions / Restrictions Precautions Precautions: Fall;Knee Required Braces or Orthoses: Knee Immobilizer - Left Restrictions Weight Bearing Restrictions: No LLE Weight Bearing: Weight bearing as tolerated    Mobility  Bed Mobility    General bed mobility comments: NT-pt reports she just got up to/from [facility] and that she cannot move right now. Pt is agreeable to ROM exercises  Transfers                   Ambulation/Gait                 Stairs            Wheelchair Mobility    Modified Rankin (Stroke Patients Only)       Balance Overall balance assessment: Needs assistance Sitting-balance support: Feet supported Sitting balance-Leahy Scale: Fair                              Cognition Arousal/Alertness: Awake/alert Behavior During Therapy: WFL for tasks assessed/performed Overall Cognitive Status: Within Functional Limits for tasks assessed                      Exercises Total Joint Exercises Ankle Circles/Pumps: AROM;Both;5 reps;Supine Quad Sets: AROM;Both;5 reps;Supine Heel Slides: AAROM;Left;5 reps;Supine Hip ABduction/ADduction: AAROM;Left;5 reps;Supine Straight Leg Raises: AAROM;5 reps;Supine Goniometric ROM: pt only able to tolerate flex ROM to ~20-25 degrees    General Comments        Pertinent Vitals/Pain Pain Assessment: 0-10 Pain Score: 7  Pain Location: L TKA Pain Descriptors / Indicators:  Guarding;Grimacing;Sore;Aching Pain Intervention(s): Limited activity within patient's tolerance;Ice applied    Home Living Family/patient expects to be discharged to:: Skilled nursing facility                    Prior Function Level of Independence: Independent with assistive device(s)      Comments: ambulates with 4 wheel RW   PT Goals (current goals can now be found in the care plan section) Acute Rehab PT Goals Patient Stated Goal: less pain. PT Goal Formulation: With patient Time For Goal Achievement: 07/28/15 Potential to Achieve Goals: Good Progress towards PT goals: Progressing toward goals (very slowly)    Frequency  7X/week    PT Plan Current plan remains appropriate    Co-evaluation             End of Session   Activity Tolerance: Patient limited by pain Patient left: in bed;with call bell/phone within reach     Time: 1443-1451 PT Time Calculation (min) (ACUTE ONLY): 8 min  Charges:  $Therapeutic Exercise: 8-22 mins                    G Codes:      Weston Anna, MPT Pager: [phone]

## 2015-07-14 NOTE — Progress Notes (Signed)
OT Cancellation Note  Patient Details Name: [name] MRN: [id number] DOB: 10/24/1960   Cancelled Treatment:    Reason Eval/Treat Not Completed: Other (comment) (Plan is for pt to d/c to SNF; will defer OT to next venue). Please re-consult if needs change. Thank you for this referral.  Binnie Kand M.S., OTR/L Pager: [phone]  07/14/2015, 12:26 PM

## 2015-07-14 NOTE — Clinical Social Work Note (Signed)
Clinical Social Work Assessment  Patient Details  Name: [name] MRN: [id number] Date of Birth: 12-18-1960  Date of referral:  07/14/15               Reason for consult:  Facility Placement                Permission sought to share information with:  Family Supports Permission granted to share information::  Yes, Verbal Permission Granted  Name::        Agency::  [facility]  Relationship::     Contact Information:     Housing/Transportation Living arrangements for the past 2 months:  Single Family Home Source of Information:  Patient Patient Interpreter Needed:  None Criminal Activity/Legal Involvement Pertinent to Current Situation/Hospitalization:  No - Comment as needed Significant Relationships:  Adult Children Lives with:  Self Do you feel safe going back to the place where you live?  No Need for family participation in patient care:  No (Coment)  Care giving concerns: pt lives at home alone and is currently max assist following surgery    Social Worker assessment / plan:  CSW met with pt to discuss SNF options- pt reports that she had already spoke with [facility] to reserve a room.  Employment status:  Disabled (Comment on whether or not currently receiving Disability) Insurance information:  Medicare PT Recommendations:  [place] / Referral to community resources:  [place]  Patient/Family's Response to care:  Pt is agreeable to rehab at [facility].  Patient/Family's Understanding of and Emotional Response to Diagnosis, Current Treatment, and Prognosis:  Pt has good understanding of current condition- had surgery on other knee last year and understands prognosis etc.  Pt is hopeful she can return home soon.  Emotional Assessment Appearance:  Appears stated age Attitude/Demeanor/Rapport:    Affect (typically observed):  Appropriate Orientation:  Oriented to Situation, Oriented to  Time, Oriented to  Place, Oriented to Self Alcohol / Substance use:  Not Applicable Psych involvement (Current and /or in the community):  No (Comment)  Discharge Needs  Concerns to be addressed:  Care Coordination Readmission within the last 30 days:  No Current discharge risk:  Physical Impairment Barriers to Discharge:  Continued Medical Work up   [employer], LCSW 07/14/2015, 3:07 PM

## 2015-07-14 NOTE — Care Management Note (Addendum)
Case Management Note  Patient Details  Name: [name] MRN: [id number] Date of Birth: [birth year]  Subjective/Objective:    Pt s/p TKR on 07/13/15.  PT/OT recommending SNF for rehab.                  Action/Plan: CSW consulted to facilitate dc to SNF when medically stable for dc.    Expected Discharge Date:                  Expected Discharge Plan:  Skilled Nursing Facility  In-House Referral:  Clinical Social Work  Discharge planning Services  CM Consult  Post Acute Care Choice:    Choice offered to:     DME Arranged:    DME Agency:     HH Arranged:    [place] Agency:     Status of Service:  In process, will continue to follow  If discussed at Long Length of Stay Meetings, dates discussed:    Additional Comments:  Ella Bodo, RN 07/14/2015, 5:04 PM

## 2015-07-14 NOTE — Evaluation (Signed)
Physical Therapy Evaluation Patient Details Name: [name] MRN: [id number] DOB: Jun 21, 1960 Today's Date: 07/14/2015   History of Present Illness  55 yo female s/p L TKA 07/13/15. Hx of DM, lichen planus, sarcoidosis, OA, OSA.   Clinical Impression  Limited eval due to significant pain. Mod assist for bed mobility. Pt sat EOB ~3 minutes. Unable to stand during this session. Will follow and progress activity as able. Recommend SNF for continued rehab.     Follow Up Recommendations SNF    Equipment Recommendations       Recommendations for Other Services       Precautions / Restrictions Precautions Precautions: Fall;Knee Required Braces or Orthoses: Knee Immobilizer - Left Restrictions Weight Bearing Restrictions: No LLE Weight Bearing: Weight bearing as tolerated      Mobility  Bed Mobility Overal bed mobility: Needs Assistance Bed Mobility: Supine to Sit;Sit to Supine     Supine to sit: Mod assist;HOB elevated Sit to supine: Mod assist;HOB elevated   General bed mobility comments: Assist for bil LEs and to scoot to EOB. Utilized bedpad. Increased time.   Transfers                 General transfer comment: NT-pt unable due to pain  Ambulation/Gait                Stairs            Wheelchair Mobility    Modified Rankin (Stroke Patients Only)       Balance Overall balance assessment: Needs assistance Sitting-balance support: Feet supported Sitting balance-Leahy Scale: Fair                                       Pertinent Vitals/Pain Pain Assessment: 0-10 Pain Score: 9  Pain Location: L TKA Pain Descriptors / Indicators: Sharp;Aching;Guarding;Grimacing Pain Intervention(s): Limited activity within patient's tolerance;Repositioned;Ice applied    Home Living Family/patient expects to be discharged to:: Skilled nursing facility                      Prior Function Level of Independence: Independent  with assistive device(s)         Comments: ambulates with 4 wheel RW     Hand Dominance        Extremity/Trunk Assessment   Upper Extremity Assessment: Defer to OT evaluation           Lower Extremity Assessment: LLE deficits/detail      Cervical / Trunk Assessment: Normal  Communication   Communication: No difficulties  Cognition Arousal/Alertness: Awake/alert Behavior During Therapy: WFL for tasks assessed/performed Overall Cognitive Status: Within Functional Limits for tasks assessed                      General Comments      Exercises        Assessment/Plan    PT Assessment Patient needs continued PT services  PT Diagnosis Difficulty walking;Acute pain;Generalized weakness   PT Problem List Decreased strength;Decreased range of motion;Decreased activity tolerance;Decreased balance;Decreased mobility;Obesity;Decreased knowledge of use of DME;Pain  PT Treatment Interventions DME instruction;Gait training;Therapeutic activities;Patient/family education;Balance training;Therapeutic exercise   PT Goals (Current goals can be found in the Care Plan section) Acute Rehab PT Goals Patient Stated Goal: less pain. PT Goal Formulation: With patient Time For Goal Achievement: 07/28/15 Potential to Achieve Goals: Good    Frequency 7X/week  Barriers to discharge        Co-evaluation               End of Session   Activity Tolerance: Patient limited by pain Patient left: with call bell/phone within reach;in bed           Time: 1110-1138 PT Time Calculation (min) (ACUTE ONLY): 28 min   Charges:   PT Evaluation $PT Eval Low Complexity: 1 Procedure     PT G Codes:        Weston Anna, MPT Pager: [phone]

## 2015-07-14 NOTE — Progress Notes (Signed)
Pt refused to wear the bone foam stated that she could not tolerate the pain but she kept her knee immobilizer on during the night. Arthor Captain LPN

## 2015-07-15 LAB — GLUCOSE, CAPILLARY
Glucose-Capillary: 115 mg/dL — ABNORMAL HIGH (ref 65–99)
Glucose-Capillary: 137 mg/dL — ABNORMAL HIGH (ref 65–99)
Glucose-Capillary: 137 mg/dL — ABNORMAL HIGH (ref 65–99)
Glucose-Capillary: 129 mg/dL — ABNORMAL HIGH (ref 65–99)

## 2015-07-15 NOTE — Progress Notes (Signed)
Orthopedic Tech Progress Note Patient Details:  [name] [facility] 1960-08-03 [id number] [name], female   DOB: [birth year], 55 y.o.   MRN: [id number]   Darrol Poke 07/15/2015, 8:58 PM

## 2015-07-15 NOTE — Progress Notes (Signed)
Physical Therapy Treatment Patient Details Name: [name] MRN: [id number] DOB: [birth year] Today's Date: 07/15/2015    History of Present Illness 55 yo female s/p L TKA 07/13/15. Hx of DM, lichen planus, sarcoidosis, OA, OSA.     PT Comments    Pt is progressing toward goals with increased activity today. Continues to need increased time and 2 person assist with mobility. Acute PT to continue during hospital stay.  Follow Up Recommendations  SNF     Equipment Recommendations  Other (comment) (TBD at next venue)    Precautions / Restrictions Precautions Required Braces or Orthoses: Knee Immobilizer - Left Knee Immobilizer - Left: On at all times Restrictions LLE Weight Bearing: Weight bearing as tolerated    Mobility  Bed Mobility Overal bed mobility: Needs Assistance       Supine to sit: Min assist;HOB elevated     General bed mobility comments: bed elevated and rail used. increased time needed for maximal pt participation. pad under pt's hips used to facilitation movement to edge of bed.  Transfers Overall transfer level: Needs assistance Equipment used: Standard walker Transfers: Sit to/from Stand;Stand Pivot Transfers Sit to Stand: +2 physical assistance;Min assist Stand pivot transfers: Min assist;+2 physical assistance       General transfer comment: 2 person min assist with cues on hand placement and left leg placement. min assist of 2 for stand>pivot transfer bed to recliner. pt initially shuffling right foot toward side, with cues progressed to stepping right foot while placing weight through left leg.       Cognition Arousal/Alertness: Awake/alert Behavior During Therapy: WFL for tasks assessed/performed Overall Cognitive Status: Within Functional Limits for tasks assessed                      Exercises Total Joint Exercises Ankle Circles/Pumps: AROM;Both;10 reps;Supine Quad Sets: AROM;Strengthening;Left;10 reps;Supine Heel  Slides: AAROM;Strengthening;Left;Supine;5 reps Straight Leg Raises: AAROM;Strengthening;Left;5 reps;Supine     Pertinent Vitals/Pain Pain Assessment: 0-10 Pain Score: 8  Pain Location: left knee Pain Descriptors / Indicators: Aching;Sore;Operative site guarding Pain Intervention(s): Limited activity within patient's tolerance;Monitored during session;Premedicated before session;Repositioned     PT Goals (current goals can now be found in the care plan section) Acute Rehab PT Goals Patient Stated Goal: less pain. PT Goal Formulation: With patient Time For Goal Achievement: 07/28/15 Potential to Achieve Goals: Good Progress towards PT goals: Progressing toward goals    Frequency  7X/week    PT Plan Current plan remains appropriate    End of Session Equipment Utilized During Treatment: Gait belt;Left knee immobilizer Activity Tolerance: Patient limited by fatigue;Patient limited by pain;Patient tolerated treatment well Patient left: in chair;with call bell/phone within reach     Time: 1214-1238 PT Time Calculation (min) (ACUTE ONLY): 24 min  Charges:  $Therapeutic Exercise: 8-22 mins $Therapeutic Activity: 8-22 mins           Willow Ora 07/15/2015, 1:53 PM  Willow Ora, PTA, CLT Acute Rehab Services [phone] 07/15/2015, 1:54 PM

## 2015-07-15 NOTE — Progress Notes (Signed)
Subjective: Pt stable  Objective: Vital signs in last 24 hours: Temp:  [98.1 F (36.7 C)-98.9 F (37.2 C)] 98.3 F (36.8 C) (07/01 0548) Pulse Rate:  [95-100] 97 (07/01 0548) Resp:  [18-20] 18 (07/01 0548) BP: (101-142)/(56-83) 101/56 mmHg (07/01 0548) SpO2:  [88 %-94 %] 92 % (07/01 0644)  Intake/Output from previous day: 06/30 0701 - 07/01 0700 In: 390 [P.O.:390] Out: -  Intake/Output this shift:    Exam:  Sensation intact distally Intact pulses distally Dorsiflexion/Plantar flexion intact  Labs:  Recent Labs  07/14/15 0716  HGB 12.7    Recent Labs  07/14/15 0716  WBC 8.6  RBC 4.06  HCT 39.4  PLT 222    Recent Labs  07/14/15 0716  NA 139  K 4.1  CL 109  CO2 22  BUN 15  CREATININE 1.09*  GLUCOSE 118*  CALCIUM 8.6*   No results for input(s): LABPT, INR in the last 72 hours.  Assessment/Plan: SNF pending Anticipate dc to SNF sunday   Marianna Payment 07/15/2015, 8:19 AM

## 2015-07-15 NOTE — Discharge Instructions (Signed)

## 2015-07-15 NOTE — Progress Notes (Signed)
During routine vitals 02 sat was 86% on RA. Instructed patient to take some deep breaths in through her nose and blow out through her mouth. 02 sats did not come up. Administered 2L of oxygen via [place], still did not improve. Lungs are clear A/P bilat with diminished bases.  No crackles heard. Called Respiratory Therapy who administered an albuteral breathing treatment, assisted with IS, and increased oxygen to 4l via n/c. RT still with patient.

## 2015-07-15 NOTE — Progress Notes (Signed)
Called by RN to assess pt. for low sats, upon arrival found pt. on 2 lpm n/c which staff placed on prior to my arrival,  sats 88% which >'d to 90% after X6 deep breaths with I/S, which <'d soon after, has hx. of Sarcoidosis/OSA with no CPAP use, currently admitted for s/p L knee surgery, has PRN Albuterol order which X1 was given followed by I/S X6, placed humidity on Oxygen per pt. c/o dry mouth/airway, pts. sats remained 90-92% on 4 lpm n/c, after I/S use that would remain 90%, pt. noted to be somewhat drowsy upon arrival, Venous C02 22 from lab work, Therapist, sports made aware along with oncoming RT.

## 2015-07-15 NOTE — Progress Notes (Signed)
Orthopedic Tech Progress Note Patient Details:  [name] [facility] [birth year] [id number]  CPM Left Knee CPM Left Knee: On Left Knee Flexion (Degrees): 35 Left Knee Extension (Degrees): 0   [place] Pink 07/15/2015, 3:11 PM

## 2015-07-15 NOTE — Progress Notes (Signed)
Pt BP low but pt asymptomatic; Dr. Erlinda Hong notified and no new orders received. Pt sitting up in chair with call light within reach. Will continue to closely monitor. Delia Heady RN

## 2015-07-15 NOTE — Progress Notes (Addendum)
Pt BP stable with sbp up in the 90's. Pt remains asymptomatic and sitting up in chair with call light within reach. Pt down to 2L oxygen during shift; pt denies any respiratory distress or discomfort. Reported off to oncoming RN. Delia Heady RN

## 2015-07-16 DIAGNOSIS — Z79899 Other long term (current) drug therapy: Secondary | ICD-10-CM | POA: Diagnosis not present

## 2015-07-16 DIAGNOSIS — K219 Gastro-esophageal reflux disease without esophagitis: Secondary | ICD-10-CM | POA: Diagnosis not present

## 2015-07-16 DIAGNOSIS — Z96651 Presence of right artificial knee joint: Secondary | ICD-10-CM | POA: Diagnosis not present

## 2015-07-16 DIAGNOSIS — G4700 Insomnia, unspecified: Secondary | ICD-10-CM | POA: Diagnosis not present

## 2015-07-16 DIAGNOSIS — M1712 Unilateral primary osteoarthritis, left knee: Secondary | ICD-10-CM | POA: Diagnosis not present

## 2015-07-16 DIAGNOSIS — E669 Obesity, unspecified: Secondary | ICD-10-CM | POA: Diagnosis not present

## 2015-07-16 DIAGNOSIS — E876 Hypokalemia: Secondary | ICD-10-CM | POA: Diagnosis not present

## 2015-07-16 DIAGNOSIS — R262 Difficulty in walking, not elsewhere classified: Secondary | ICD-10-CM | POA: Diagnosis not present

## 2015-07-16 DIAGNOSIS — F339 Major depressive disorder, recurrent, unspecified: Secondary | ICD-10-CM | POA: Diagnosis not present

## 2015-07-16 DIAGNOSIS — Z96652 Presence of left artificial knee joint: Secondary | ICD-10-CM | POA: Diagnosis not present

## 2015-07-16 DIAGNOSIS — M13869 Other specified arthritis, unspecified knee: Secondary | ICD-10-CM | POA: Diagnosis not present

## 2015-07-16 DIAGNOSIS — I10 Essential (primary) hypertension: Secondary | ICD-10-CM | POA: Diagnosis not present

## 2015-07-16 DIAGNOSIS — R269 Unspecified abnormalities of gait and mobility: Secondary | ICD-10-CM | POA: Diagnosis not present

## 2015-07-16 DIAGNOSIS — E559 Vitamin D deficiency, unspecified: Secondary | ICD-10-CM | POA: Diagnosis not present

## 2015-07-16 DIAGNOSIS — Z79891 Long term (current) use of opiate analgesic: Secondary | ICD-10-CM | POA: Diagnosis not present

## 2015-07-16 DIAGNOSIS — M25562 Pain in left knee: Secondary | ICD-10-CM | POA: Diagnosis not present

## 2015-07-16 DIAGNOSIS — D649 Anemia, unspecified: Secondary | ICD-10-CM | POA: Diagnosis not present

## 2015-07-16 DIAGNOSIS — E43 Unspecified severe protein-calorie malnutrition: Secondary | ICD-10-CM | POA: Diagnosis not present

## 2015-07-16 DIAGNOSIS — G43809 Other migraine, not intractable, without status migrainosus: Secondary | ICD-10-CM | POA: Diagnosis not present

## 2015-07-16 DIAGNOSIS — D869 Sarcoidosis, unspecified: Secondary | ICD-10-CM | POA: Diagnosis not present

## 2015-07-16 DIAGNOSIS — R2681 Unsteadiness on feet: Secondary | ICD-10-CM | POA: Diagnosis not present

## 2015-07-16 DIAGNOSIS — Z5189 Encounter for other specified aftercare: Secondary | ICD-10-CM | POA: Diagnosis not present

## 2015-07-16 DIAGNOSIS — E1142 Type 2 diabetes mellitus with diabetic polyneuropathy: Secondary | ICD-10-CM | POA: Diagnosis not present

## 2015-07-16 DIAGNOSIS — R600 Localized edema: Secondary | ICD-10-CM | POA: Diagnosis not present

## 2015-07-16 DIAGNOSIS — M6281 Muscle weakness (generalized): Secondary | ICD-10-CM | POA: Diagnosis not present

## 2015-07-16 DIAGNOSIS — G894 Chronic pain syndrome: Secondary | ICD-10-CM | POA: Diagnosis not present

## 2015-07-16 DIAGNOSIS — M1990 Unspecified osteoarthritis, unspecified site: Secondary | ICD-10-CM | POA: Diagnosis not present

## 2015-07-16 LAB — GLUCOSE, CAPILLARY
GLUCOSE-CAPILLARY: 142 mg/dL — AB (ref 65–99)
Glucose-Capillary: 105 mg/dL — ABNORMAL HIGH (ref 65–99)

## 2015-07-16 NOTE — Clinical Social Work Note (Signed)
CSW confirmed with pt doctor pt is ready to discharge to [facility]. CSW contacted Education officer, museum at [place] and notified her that pt would arrive to [place] via [facility]. CSW contacted [facility] to transport pt and requested that pt nurse contact SNF and give report.

## 2015-07-16 NOTE — Progress Notes (Signed)
Subjective: Pt stable  Objective: Vital signs in last 24 hours: Temp:  [98.2 F (36.8 C)-99.1 F (37.3 C)] 98.4 F (36.9 C) (07/01 1730) Pulse Rate:  [71-92] 92 (07/02 0636) Resp:  [18] 18 (07/01 1748) BP: (77-130)/(42-78) 100/60 mmHg (07/02 0636) SpO2:  [90 %-99 %] 90 % (07/02 0636)  Intake/Output from previous day: 07/01 0701 - 07/02 0700 In: 480 [P.O.:480] Out: -  Intake/Output this shift:    Exam:  Sensation intact distally Intact pulses distally Dorsiflexion/Plantar flexion intact  Labs:  Recent Labs  07/14/15 0716  HGB 12.7    Recent Labs  07/14/15 0716  WBC 8.6  RBC 4.06  HCT 39.4  PLT 222    Recent Labs  07/14/15 0716  NA 139  K 4.1  CL 109  CO2 22  BUN 15  CREATININE 1.09*  GLUCOSE 118*  CALCIUM 8.6*   No results for input(s): LABPT, INR in the last 72 hours.  Assessment/Plan: SNF today   Marianna Payment 07/16/2015, 8:38 AM

## 2015-07-16 NOTE — Progress Notes (Signed)
Orthopedic Tech Progress Note Patient Details:  [name] 10-16-1960 [id number]  Patient ID: [name], female   DOB: [birth year], 55 y.o.   MRN: [id number] Pt refused cpm.  [name] 07/16/2015, 5:58 AM

## 2015-07-16 NOTE — Progress Notes (Signed)
Orthopedic Tech Progress Note Patient Details:  [name] [facility] 06-Mar-1960 [id number]  CPM Left Knee CPM Left Knee: On Left Knee Flexion (Degrees): 35 Left Knee Extension (Degrees): 0   [place] Pink 07/16/2015, 12:50 PM

## 2015-07-16 NOTE — Progress Notes (Signed)
Orthopedic Tech Progress Note Patient Details:  [name] 08/20/1960 [id number]  Patient ID: [name], female   DOB: 01/29/1960, 55 y.o.   MRN: [id number]   [name] 07/16/2015, 5:58 AM

## 2015-07-16 NOTE — Progress Notes (Signed)
Physical Therapy Treatment Patient Details Name: [name] MRN: [id number] DOB: 08/13/1960 Today's Date: 07/16/2015    History of Present Illness 55 yo female s/p L TKA 07/13/15. Hx of DM, lichen planus, sarcoidosis, OA, OSA.     PT Comments    Pt to d/c to [facility] today.  Follow Up Recommendations  SNF     Equipment Recommendations       Recommendations for Other Services       Precautions / Restrictions Precautions Precautions: Fall;Knee Required Braces or Orthoses: Knee Immobilizer - Left Knee Immobilizer - Left: On at all times Restrictions LLE Weight Bearing: Weight bearing as tolerated    Mobility  Bed Mobility         Supine to sit: Min assist;HOB elevated     General bed mobility comments: assist with LLE, +rail  Transfers   Equipment used: Rolling walker (2 wheeled)   Sit to Stand: Min assist;+2 physical assistance Stand pivot transfers: Min assist;+2 physical assistance       General transfer comment: verbal cues for sequencing, increased time required to complete  Ambulation/Gait                 Stairs            Wheelchair Mobility    Modified Rankin (Stroke Patients Only)       Balance                                    Cognition Arousal/Alertness: Awake/alert Behavior During Therapy: WFL for tasks assessed/performed Overall Cognitive Status: Within Functional Limits for tasks assessed                      Exercises      General Comments        Pertinent Vitals/Pain Pain Assessment: 0-10 Pain Score: 5  Pain Location: L knee Pain Descriptors / Indicators: Sore Pain Intervention(s): Monitored during session;Repositioned;Premedicated before session    Home Living                      Prior Function            PT Goals (current goals can now be found in the care plan section) Acute Rehab PT Goals Patient Stated Goal: less pain. PT Goal Formulation: With  patient Time For Goal Achievement: 07/28/15 Potential to Achieve Goals: Good Progress towards PT goals: Progressing toward goals    Frequency  7X/week    PT Plan Current plan remains appropriate    Co-evaluation             End of Session Equipment Utilized During Treatment: Gait belt;Left knee immobilizer Activity Tolerance: Patient tolerated treatment well Patient left: in chair;with call bell/phone within reach     Time: 0852-0912 PT Time Calculation (min) (ACUTE ONLY): 20 min  Charges:  $Therapeutic Activity: 8-22 mins                    G Codes:      Lorriane Shire 07/16/2015, 10:50 AM

## 2015-07-17 ENCOUNTER — Non-Acute Institutional Stay (SKILLED_NURSING_FACILITY): Payer: Medicare Other | Admitting: Internal Medicine

## 2015-07-17 ENCOUNTER — Encounter: Payer: Self-pay | Admitting: Internal Medicine

## 2015-07-17 DIAGNOSIS — E1142 Type 2 diabetes mellitus with diabetic polyneuropathy: Secondary | ICD-10-CM

## 2015-07-17 DIAGNOSIS — G4700 Insomnia, unspecified: Secondary | ICD-10-CM

## 2015-07-17 DIAGNOSIS — E559 Vitamin D deficiency, unspecified: Secondary | ICD-10-CM | POA: Diagnosis not present

## 2015-07-17 DIAGNOSIS — M1712 Unilateral primary osteoarthritis, left knee: Secondary | ICD-10-CM

## 2015-07-17 DIAGNOSIS — K219 Gastro-esophageal reflux disease without esophagitis: Secondary | ICD-10-CM

## 2015-07-17 DIAGNOSIS — R600 Localized edema: Secondary | ICD-10-CM

## 2015-07-17 DIAGNOSIS — R2681 Unsteadiness on feet: Secondary | ICD-10-CM

## 2015-07-17 DIAGNOSIS — I10 Essential (primary) hypertension: Secondary | ICD-10-CM | POA: Diagnosis not present

## 2015-07-17 DIAGNOSIS — E876 Hypokalemia: Secondary | ICD-10-CM | POA: Diagnosis not present

## 2015-07-17 DIAGNOSIS — F339 Major depressive disorder, recurrent, unspecified: Secondary | ICD-10-CM | POA: Diagnosis not present

## 2015-07-17 NOTE — Progress Notes (Signed)
LOCATION: [place]  PCP: Walker Kehr, MD   Code Status: Full Code  Goals of care: Advanced Directive information Advanced Directives 07/14/2015  Does patient have an advance directive? No  Would patient like information on creating an advanced directive? -       Extended Emergency Contact Information Primary Emergency Contact: Schueler,Michael Address: [address]          [place], [place] [address] Johnnette Litter of [place] Phone: [phone] Relation: Son Secondary Emergency Contact: Nino Glow [place] of [place] Phone: [phone] Relation: Sister   Allergies  Allergen Reactions  . Enalapril Maleate     REACTION: cough  . Hydrochlorothiazide     REACTION: Low potassium  . Hydroxychloroquine Sulfate     REACTION: vision changes  . Iodine     Mother was intolerant--CODED on CT table---pt never tired.  . Latex     Hives  . Nickel Other (See Comments)    Pt has a titanium right knee - nickel causes skin irritations that forms into blisters and sores    Chief Complaint  Patient presents with  . New Admit To SNF    New Admission     HPI:  Patient is a 55 y.o. female seen today for short term rehabilitation post hospital admission from 07/13/15-07/16/15 with left knee OA. She underwent left total knee arthroplasty. She is seen in her room today. Her pain has not been under control. Denies any other concerns.   Review of Systems:  Constitutional: Negative for fever, chills, diaphoresis. Feels weak and tired.  HENT: Negative for headache, congestion, nasal discharge, sore throat, difficulty swallowing.   Eyes: Negative for blurred vision, double vision and discharge. Wears glasses.  Respiratory: Negative for cough, shortness of breath and wheezing.   Cardiovascular: Negative for chest pain, palpitations, leg swelling.  Gastrointestinal: Negative for heartburn, nausea, vomiting, abdominal pain. Last bowel movement was  Friday morning. Had regular bowel movement at home.  Genitourinary: Negative for dysuria and flank pain.  Musculoskeletal: Negative for back pain, fall in the facility.  Skin: Negative for itching, rash.    Past Medical History  Diagnosis Date  . Osteoarthritis   . Sarcoidosis ([employer])     Dr. Lolita Patella  . Anxiety   . Vitamin D deficiency     is supposed to take Vit D but can't afford it  . Allergic rhinitis   . ALLERGIC RHINITIS 10/26/2009  . ANXIETY 08/14/2006  . B12 DEFICIENCY 08/25/2007  . VITAMIN D DEFICIENCY 08/25/2007  . Hypertension     takes Coreg daily  . HYPERTENSION 08/27/2007  . DYSPNEA 04/28/2009    with exertion  . Shortness of breath   . Pneumonia     over 30 yrs ago  . Headache     last migraine 2-47yrs ago;takes Topamax daily  . Confusion   . Short-term memory loss   . Long-term memory loss   . TIA (transient ischemic attack)   . Joint pain   . Joint swelling   . Rheumatoid arthritis ([place])   . Osteoarthritis   . Lichen planus   . Insomnia     takes Nortriptyline nightly   . Esophageal reflux     takes Nexium daily  . Depression     takes Lexapro daily  . DEPRESSION 08/14/2006  . Urinary urgency   . Nocturia   . Diabetes mellitus without complication (HCC)     was on insulin but has been off  since Nov 2015 and now only takes Metformin daily  . OSA (obstructive sleep apnea)     doesn't use CPAP;sleep study in epic from 2006  . History of shingles   . Fibromyalgia    Past Surgical History  Procedure Laterality Date  . Arthroscopic knee surgery Right 11-12-04  . Appendectomy    . Endometrial ablation    . Axillary abcess irrigation and debridement  Jul & Aug2012  . Cyst removed from top of buttocks  at age 2  . Carpal tunnel release Left 05/23/2014    Procedure: CARPAL TUNNEL RELEASE;  Surgeon: Meredith Pel, MD;  Location: [facility] [place];  Service: Orthopedics;  Laterality: Left;  . Total knee arthroplasty Right 11/15/2014    Procedure: TOTAL RIGHT KNEE  ARTHROPLASTY;  Surgeon: Meredith Pel, MD;  Location: [place];  Service: Orthopedics;  Laterality: Right;  . Total knee arthroplasty Left 07/13/2015    Procedure: LEFT TOTAL KNEE ARTHROPLASTY;  Surgeon: Meredith Pel, MD;  Location: [facility];  Service: Orthopedics;  Laterality: Left;   Social History:   reports that she has quit smoking. Her smoking use included Cigarettes. She has a 5 pack-year smoking history. She has never used smokeless tobacco. She reports that she does not drink alcohol or use illicit drugs.  Family History  Problem Relation Age of Onset  . Hypertension    . Coronary artery disease      female 1st degree relative <60  . Heart failure      congestive  . Coronary artery disease      Female 1st degree relative <50  . Breast cancer      1st degree relative <50 S  . Heart disease Mother   . Kidney disease Mother   . Cancer Father     leukemia    Medications:   Medication List       This list is accurate as of: 07/17/15  2:47 PM.  Always use your most recent med list.               albuterol 108 (90 Base) MCG/ACT inhaler  Commonly known as:  PROVENTIL HFA;VENTOLIN HFA  Inhale 2 puffs into the lungs every 6 (six) hours as needed for wheezing or shortness of breath.     BUTRANS 20 MCG/HR Ptwk patch  Generic drug:  buprenorphine  Place 1 patch onto the skin every 7 (seven) days.     carvedilol 12.5 MG tablet  Commonly known as:  COREG  Take 1 tablet (12.5 mg total) by mouth 2 (two) times daily with a meal.     chlorhexidine 0.12 % solution  Commonly known as:  PERIDEX  Use as directed 5 mLs in the mouth or throat 2 (two) times daily as needed.     clobetasol 0.05 % external solution  Commonly known as:  TEMOVATE  Apply 1 application topically 2 (two) times daily.     ergocalciferol 50000 units capsule  Commonly known as:  VITAMIN D2  Take 1 capsule (50,000 Units total) by mouth every 30 (thirty) days.     escitalopram 20 MG tablet  Commonly  known as:  LEXAPRO  TAKE 1 TABLET BY MOUTH DAILY     esomeprazole 40 MG capsule  Commonly known as:  NEXIUM  TAKE 1 CAPSULE BY MOUTH EVERY DAY BEFORE BREAKFAST     freestyle lancets  1 each by Other route 2 (two) times daily. Use as instructed     FREESTYLE LITE Kerrin Mo  Use to check blood sugar 2 times per day     furosemide 20 MG tablet  Commonly known as:  LASIX  Take 1 tablet (20 mg total) by mouth daily.     gabapentin 300 MG capsule  Commonly known as:  NEURONTIN  Take 300 mg by mouth 2 (two) times daily.     glucose blood test strip  Commonly known as:  FREESTYLE LITE  TEST TWICE DAILY     hydroquinone 4 % cream  APPLY TO THE DARK SPOTS ON THE SKIN QHS FOR 3 MONTHS AS DIRECTED     KLOR-CON 10 10 MEQ tablet  Generic drug:  potassium chloride  TAKE 1 TABLET BY MOUTH DAILY     metFORMIN 500 MG tablet  Commonly known as:  GLUCOPHAGE  Take 1,000 mg by mouth daily.     methocarbamol 500 MG tablet  Commonly known as:  ROBAXIN  Take 500 mg by mouth 2 (two) times daily.     nortriptyline 75 MG capsule  Commonly known as:  PAMELOR  Take 1 capsule (75 mg total) by mouth at bedtime.     oxyCODONE 5 MG immediate release tablet  Commonly known as:  Oxy IR/ROXICODONE  Take 1-2 tablets (5-10 mg total) by mouth every 3 (three) hours as needed for breakthrough pain.     oxyCODONE 10 mg 12 hr tablet  Commonly known as:  OXYCONTIN  Take 1 tablet (10 mg total) by mouth every 12 (twelve) hours.     rivaroxaban 10 MG Tabs tablet  Commonly known as:  XARELTO  Take 1 tablet (10 mg total) by mouth daily with breakfast.     Suvorexant 20 MG Tabs  Commonly known as:  BELSOMRA  Take 20 mg by mouth at bedtime.     topiramate 50 MG tablet  Commonly known as:  TOPAMAX  TAKE 1 TABLET BY MOUTH TWICE DAILY     VOLTAREN 1 % Gel  Generic drug:  diclofenac sodium  APPLY 1 GM  TOPICAL TWICE DAILY        Immunizations: Immunization History  Administered Date(s) Administered  .  H1N1 12/24/2007  . Influenza Split 10/11/2010  . Influenza Whole 10/14/2007, 10/05/2008, 10/26/2009, 11/13/2011  . Influenza,inj,Quad PF,36+ Mos 09/21/2012, 09/24/2013, 10/06/2014  . PPD Test 11/18/2014  . Pneumococcal Conjugate-13 07/30/2010, 12/29/2012  . Pneumococcal Polysaccharide-23 12/23/2013  . Tdap 06/28/2014     Physical Exam Filed Vitals:   07/17/15 1432  BP: 93/66  Pulse: 82  Temp: 99.5 F (37.5 C)  TempSrc: Oral  Resp: 18  Height: 5\' 7"  (1.702 m)  Weight: 263 lb 9.6 oz (119.568 kg)  SpO2: 93%   Body mass index is 41.28 kg/(m^2).  General- elderly female, obese, in no acute distress Head- normocephalic, atraumatic Nose- no maxillary or frontal sinus tenderness, no nasal discharge Throat- moist mucus membrane Eyes- PERRLA, EOMI, no pallor, no icterus, no discharge, normal conjunctiva, normal sclera Neck- no cervical lymphadenopathy Cardiovascular- normal s1,s2, no murmur, trace leg edema, ted hose + to both legs Respiratory- bilateral clear to auscultation, no wheeze, no rhonchi, no crackles, no use of accessory muscles Abdomen- bowel sounds present, soft, non tender Musculoskeletal- able to move all 4 extremities, limited left knee range of motion Neurological- alert and oriented to person, place and time Skin- warm and dry, left knee surgical incision with aquacel dressing Psychiatry- normal mood and affect    Labs reviewed: Basic Metabolic Panel:  Recent Labs  06/08/15 1033 07/04/15 1204 07/14/15 0716  NA 140 139 139  K 3.9 3.9 4.1  CL 107 106 109  CO2 28 26 22   GLUCOSE 104* 134* 118*  BUN 19 13 15   CREATININE 0.82 1.23* 1.09*  CALCIUM 9.7 9.3 8.6*   Liver Function Tests:  Recent Labs  04/03/15 1329 06/08/15 1033  AST 31 17  ALT 35 17  ALKPHOS 122* 90  BILITOT 0.3 0.2  PROT 7.5 7.2  ALBUMIN 3.9 4.0   No results for input(s): LIPASE, AMYLASE in the last 8760 hours. No results for input(s): AMMONIA in the last 8760  hours. CBC:  Recent Labs  02/01/15 1033 04/03/15 1329 06/08/15 1033 07/04/15 1204 07/14/15 0716  WBC 6.6 7.3 10.4 7.1 8.6  NEUTROABS 3.4 4.3 7.9*  --   --   HGB 13.5 13.4 14.5 12.4 12.7  HCT 41.3 39.8 44.0 39.3 39.4  MCV 92.2 92.5 93.8 98.0 97.0  PLT 297.0 265.0 274.0 236 222   Cardiac Enzymes: No results for input(s): CKTOTAL, CKMB, CKMBINDEX, TROPONINI in the last 8760 hours. BNP: Invalid input(s): POCBNP CBG:  Recent Labs  07/15/15 2305 07/16/15 0701 07/16/15 1120  GLUCAP 129* 105* 142*    Radiological Exams: No results found.  Assessment/Plan  Unsteady gait With left knee replacement surgery. Will have patient work with PT/OT as tolerated to regain strength and restore function.  Fall precautions are in place.  Left knee OA S/p left total knee arthroplasty. Had orthopedic follow up. Continue b patch once a week. Continue oxycodone 10 mg bid with oxycodone IR 5 mg 1-2 tab q3h prn pain. Continue robaxin 500 mg bid for muscle spasm. Continue xarelto 10 mg daily for dvt prophylaxis. Will have her work with physical therapy and occupational therapy team to help with gait training and muscle strengthening exercises.fall precautions. Skin care. Encourage to be out of bed. Get physiatry consult. Follows with pain clinic outpatient  HTN Soft bp reading, denies symptom, check bp/HR bid for now. continue coreg 12.5 mg bid  Vitamin d def Continue vitamin d supplement  Chronic depression Continue lexapro 20 mg daily and pamelor 75 mg daily with topamax  gerd Continue PPI for now, stable  Leg edema Continue lasix 20 mg daily, check bmp. To wear her ted hose  Neuropathic pain Continue neurontin 300 mg bid  Hypokalemia With her on lasix, continue kcl and check bmp  Dm type 2 with neuropathy Lab Results  Component Value Date   HGBA1C 5.8* 07/04/2015   Continue metofrmin 1000 mg daily, monitor renal function. Check bmp. Continue gabapentin  Chronic  Insomnia Continue belsomra     Goals of care: short term rehabilitation   Labs/tests ordered: cbc, cmp  Family/ staff Communication: reviewed care plan with patient and nursing supervisor    Blanchie Serve, MD Internal Medicine [address] [place], [place] [address] Cell Phone (Monday-Friday 8 am - 5 pm): [phone] On Call: [phone] and follow prompts after 5 pm and on weekends Office Phone: [phone] Office Fax: [phone]

## 2015-07-19 LAB — CBC AND DIFFERENTIAL
HEMATOCRIT: 35 % — AB (ref 36–46)
HEMOGLOBIN: 10.9 g/dL — AB (ref 12.0–16.0)
Neutrophils Absolute: 4 /uL
PLATELETS: 229 10*3/uL (ref 150–399)
WBC: 6.3 10^3/mL

## 2015-07-19 LAB — BASIC METABOLIC PANEL
BUN: 12 mg/dL (ref 4–21)
Creatinine: 0.9 mg/dL (ref 0.5–1.1)
GLUCOSE: 98 mg/dL
POTASSIUM: 4.4 mmol/L (ref 3.4–5.3)
Sodium: 144 mmol/L (ref 137–147)

## 2015-07-19 LAB — HEPATIC FUNCTION PANEL
ALT: 21 U/L (ref 7–35)
AST: 21 U/L (ref 13–35)
Alkaline Phosphatase: 132 U/L — AB (ref 25–125)
BILIRUBIN, TOTAL: 0.5 mg/dL

## 2015-07-20 ENCOUNTER — Encounter: Payer: Self-pay | Admitting: Adult Health

## 2015-07-20 ENCOUNTER — Non-Acute Institutional Stay (SKILLED_NURSING_FACILITY): Payer: Medicare Other | Admitting: Adult Health

## 2015-07-20 DIAGNOSIS — M25562 Pain in left knee: Secondary | ICD-10-CM

## 2015-07-20 DIAGNOSIS — E43 Unspecified severe protein-calorie malnutrition: Secondary | ICD-10-CM

## 2015-07-20 NOTE — Progress Notes (Addendum)
Patient ID: [name], female   DOB: 1960-03-18, 55 y.o.   MRN: [id number]    DATE:  07/20/2015   MRN:  [id number]  BIRTHDAY: 1960-12-19  Facility:  Nursing Home Location:  [place] Room Number: [id number]  LEVEL OF CARE:  SNF [phone])  Contact Information    Name Relation Home Work [place] Son [phone]     Aiesha, [place] [phone]     Sadaya, Bessette [phone]         Code Status History    Date Active Date Inactive Code Status Order ID Comments User Context   07/13/2015  5:47 PM 07/16/2015  4:31 PM Full Code [id number]  Meredith Pel, MD Inpatient   11/15/2014  6:15 PM 11/18/2014  5:55 PM Full Code [id number]  Meredith Pel, MD Inpatient       Chief Complaint  Patient presents with  . Acute Visit    Protein calorie malnutrition    HISTORY OF PRESENT ILLNESS:  This is a 55 year old female who was noted to have albumin 2.90. She complained that she has pain on her left knee. Reviewed medications with her and noted that Butrans patch is Q Sunday and patient was admitted on 07/16/15 (Sunday). Body checked was done by charge nurse and no Butrans patch was noted.  She has been admitted to [facility] on 07/16/15 from [facility] with left knee osteoarthritis for which she had left total knee arthroplasty on 07/13/15.  She has been admitted for a short-term rehabilitation.   PAST MEDICAL HISTORY:  Past Medical History  Diagnosis Date  . Osteoarthritis   . Sarcoidosis ([place])     Dr. Lolita Patella  . Anxiety   . Vitamin D deficiency     is supposed to take Vit D but can't afford it  . Allergic rhinitis   . ALLERGIC RHINITIS 10/26/2009  . ANXIETY 08/14/2006  . B12 DEFICIENCY 08/25/2007  . VITAMIN D DEFICIENCY 08/25/2007  . Hypertension     takes Coreg daily  . DYSPNEA 04/28/2009    with exertion  . Shortness of breath   . Pneumonia     over 30 yrs ago  . Headache     last  migraine 2-76yrs ago;takes Topamax daily  . Confusion   . Short-term memory loss   . Long-term memory loss   . TIA (transient ischemic attack)   . Joint pain   . Joint swelling   . Rheumatoid arthritis ([place])   . Osteoarthritis   . Lichen planus   . Insomnia     takes Nortriptyline nightly   . Esophageal reflux     takes Nexium daily  . Depression     takes Lexapro daily  . Urinary urgency   . Nocturia   . Diabetes mellitus without complication ([place])     was on insulin but has been off since Nov 2015 and now only takes Metformin daily  . OSA (obstructive sleep apnea)     doesn't use CPAP;sleep study in epic from 2006  . History of shingles   . Fibromyalgia   . Unsteady gait      CURRENT MEDICATIONS: Reviewed  Patient's Medications  New Prescriptions   No medications on file  Previous Medications   ALBUTEROL (PROVENTIL HFA;VENTOLIN HFA) 108 (90 BASE) MCG/ACT INHALER    Inhale 2 puffs into the lungs every 6 (six) hours as needed for wheezing or shortness of breath.   BLOOD GLUCOSE  MONITORING SUPPL (FREESTYLE LITE) DEVI    Use to check blood sugar 2 times per day   BUTRANS 20 MCG/HR PTWK PATCH    Place 1 patch onto the skin every 7 (seven) days.   CARVEDILOL (COREG) 12.5 MG TABLET    Take 1 tablet (12.5 mg total) by mouth 2 (two) times daily with a meal.   CHLORHEXIDINE (PERIDEX) 0.12 % SOLUTION    Use as directed 5 mLs in the mouth or throat 2 (two) times daily as needed.    CLOBETASOL (TEMOVATE) 0.05 % EXTERNAL SOLUTION    Apply 1 application topically 2 (two) times daily.   DOCUSATE SODIUM (COLACE) 100 MG CAPSULE    Take 100 mg by mouth.   ERGOCALCIFEROL (VITAMIN D2) 50000 UNITS CAPSULE    Take 1 capsule (50,000 Units total) by mouth every 30 (thirty) days.   ESCITALOPRAM (LEXAPRO) 20 MG TABLET    TAKE 1 TABLET BY MOUTH DAILY   ESOMEPRAZOLE (NEXIUM) 40 MG CAPSULE    TAKE 1 CAPSULE BY MOUTH EVERY DAY BEFORE BREAKFAST   FUROSEMIDE (LASIX) 20 MG TABLET    Take 1 tablet (20 mg  total) by mouth daily.   GABAPENTIN (NEURONTIN) 300 MG CAPSULE    Take 300 mg by mouth 2 (two) times daily.   GLUCOSE BLOOD (FREESTYLE LITE) TEST STRIP    TEST TWICE DAILY   HYDROQUINONE 4 % CREAM    APPLY TO THE DARK SPOTS ON THE SKIN QHS FOR 3 MONTHS AS DIRECTED   KLOR-CON 10 10 MEQ TABLET    TAKE 1 TABLET BY MOUTH DAILY   LANCETS (FREESTYLE) LANCETS    1 each by Other route 2 (two) times daily. Use as instructed   METFORMIN (GLUCOPHAGE) 500 MG TABLET    Take 1,000 mg by mouth daily.   METHOCARBAMOL (ROBAXIN) 500 MG TABLET    Take 500 mg by mouth 2 (two) times daily.   NORTRIPTYLINE (PAMELOR) 75 MG CAPSULE    Take 1 capsule (75 mg total) by mouth at bedtime.   OXYCODONE (OXY IR/ROXICODONE) 5 MG IMMEDIATE RELEASE TABLET    Take 1-2 tablets (5-10 mg total) by mouth every 3 (three) hours as needed for breakthrough pain.   OXYCODONE (OXYCONTIN) 10 MG 12 HR TABLET    Take 1 tablet (10 mg total) by mouth every 12 (twelve) hours.   POLYETHYLENE GLYCOL (MIRALAX / GLYCOLAX) PACKET    Take 17 g by mouth daily.   PROTEIN (PROCEL) POWD    Take 2 scoop by mouth 2 (two) times daily.   RIVAROXABAN (XARELTO) 10 MG TABS TABLET    Take 1 tablet (10 mg total) by mouth daily with breakfast.   SUVOREXANT (BELSOMRA) 20 MG TABS    Take 20 mg by mouth at bedtime.   TOPIRAMATE (TOPAMAX) 50 MG TABLET    TAKE 1 TABLET BY MOUTH TWICE DAILY   VOLTAREN 1 % GEL    APPLY 1 GM  TOPICAL TWICE DAILY  Modified Medications   No medications on file  Discontinued Medications   No medications on file     Allergies  Allergen Reactions  . Enalapril Maleate     REACTION: cough  . Hydrochlorothiazide     REACTION: Low potassium  . Hydroxychloroquine Sulfate     REACTION: Angela changes  . Iodine     Mother was intolerant--CODED on CT table---pt never tired.  . Latex     Hives  . Nickel Other (See Comments)    Pt has  a titanium right knee - nickel causes skin irritations that forms into blisters and sores     REVIEW  OF SYSTEMS:  GENERAL: no change in appetite, no fatigue, no weight changes, no fever, chills or weakness EYES: Denies change in Angela, dry eyes, eye pain, itching or discharge EARS: Denies change in hearing, ringing in ears, or earache NOSE: Denies nasal congestion or epistaxis MOUTH and THROAT: Denies oral discomfort, gingival pain or bleeding, pain from teeth or hoarseness   RESPIRATORY: no cough, SOB, DOE, wheezing, hemoptysis CARDIAC: no chest pain, or palpitations GI: no abdominal pain, diarrhea, constipation, heart burn, nausea or vomiting GU: Denies dysuria, frequency, hematuria, incontinence, or discharge PSYCHIATRIC: Denies feeling of depression or anxiety. No report of hallucinations, insomnia, paranoia, or agitation   PHYSICAL EXAMINATION  GENERAL APPEARANCE: Well nourished. In no acute distress. Morbidly obese SKIN:  Left knee surgical incision is covered with Aquacel dressing, dry, no erythema  HEAD: Normal in size and contour. No evidence of trauma EYES: Lids open and close normally. No blepharitis, entropion or ectropion. PERRL. Conjunctivae are clear and sclerae are white. Lenses are without opacity EARS: Pinnae are normal. Patient hears normal voice tunes of the examiner MOUTH and THROAT: Lips are without lesions. Oral mucosa is moist and without lesions. Tongue is normal in shape, size, and color and without lesions NECK: supple, trachea midline, no neck masses, no thyroid tenderness, no thyromegaly LYMPHATICS: no LAN in the neck, no supraclavicular LAN RESPIRATORY: breathing is even & unlabored, BS CTAB CARDIAC: RRR, no murmur,no extra heart sounds, no edema GI: abdomen soft, normal BS, no masses, no tenderness, no hepatomegaly, no splenomegaly EXTREMITIES:  Able to move X 4 extremities PSYCHIATRIC: Alert and oriented X 3. Affect and behavior are appropriate  LABS/RADIOLOGY: Labs reviewed: Basic Metabolic Panel:  Recent Labs  06/08/15 1033 07/04/15 1204  07/14/15 0716 07/19/15  NA 140 139 139 144  K 3.9 3.9 4.1 4.4  CL 107 106 109  --   CO2 28 26 22   --   GLUCOSE 104* 134* 118*  --   BUN 19 13 15 12   CREATININE 0.82 1.23* 1.09* 0.9  CALCIUM 9.7 9.3 8.6*  --    Liver Function Tests:  Recent Labs  04/03/15 1329 06/08/15 1033 07/19/15  AST 31 17 21   ALT 35 17 21  ALKPHOS 122* 90 132*  BILITOT 0.3 0.2  --   PROT 7.5 7.2  --   ALBUMIN 3.9 4.0  --    CBC:  Recent Labs  02/01/15 1033 04/03/15 1329 06/08/15 1033 07/04/15 1204 07/14/15 0716  WBC 6.6 7.3 10.4 7.1 8.6  NEUTROABS 3.4 4.3 7.9*  --   --   HGB 13.5 13.4 14.5 12.4 12.7  HCT 41.3 39.8 44.0 39.3 39.4  MCV 92.2 92.5 93.8 98.0 97.0  PLT 297.0 265.0 274.0 236 222   CBG:  Recent Labs  07/15/15 2305 07/16/15 0701 07/16/15 1120  GLUCAP 129* 105* 142*     ASSESSMENT/PLAN:  Left knee pain - start Butrans 20 mcg/hr 1 patch to skin Q 7 days, continue Gabapentin 300 mg 1 capsule PO BID, Oxycodone 5 mg 1-2 tabs PO Q 3 hours PRN and Oxycontin 10 mg 1 tab PO Q 12 hours.  Protein-calorie malnutrition, severe - albumin 2.90; start Procel 2 scoops PO BID; RD consult     Durenda Age, NP [place]

## 2015-07-25 ENCOUNTER — Other Ambulatory Visit: Payer: Self-pay | Admitting: *Deleted

## 2015-07-25 MED ORDER — BUTRANS 20 MCG/HR TD PTWK: 20.0000 ug | MEDICATED_PATCH | TRANSDERMAL | Status: DC

## 2015-07-25 NOTE — Telephone Encounter (Signed)
[facility] 

## 2015-07-26 DIAGNOSIS — Z96651 Presence of right artificial knee joint: Secondary | ICD-10-CM | POA: Diagnosis not present

## 2015-07-26 DIAGNOSIS — Z96652 Presence of left artificial knee joint: Secondary | ICD-10-CM | POA: Diagnosis not present

## 2015-07-28 ENCOUNTER — Encounter: Payer: Self-pay | Admitting: Adult Health

## 2015-07-28 ENCOUNTER — Non-Acute Institutional Stay (SKILLED_NURSING_FACILITY): Payer: Medicare Other | Admitting: Adult Health

## 2015-07-28 DIAGNOSIS — D869 Sarcoidosis, unspecified: Secondary | ICD-10-CM

## 2015-07-28 NOTE — Progress Notes (Signed)
Patient ID: [name], female   DOB: 1960/02/16, 55 y.o.   MRN: [id number]    DATE:  07/28/15  MRN:  [id number]  BIRTHDAY: Sep 23, 1960  Facility:  Nursing Home Location:  [place] Room Number: [id number]  LEVEL OF CARE:  SNF [phone])  Contact Information    Name Relation Home Work [place] Son [phone]     Jasalynn, Pfe[place] [phone]     Lulani, [place] [phone]         Code Status History    Date Active Date Inactive Code Status Order ID Comments User Context   07/13/2015  5:47 PM 07/16/2015  4:31 PM Full Code [id number]  Meredith Pel, MD Inpatient   11/15/2014  6:15 PM 11/18/2014  5:55 PM Full Code [id number]  Meredith Pel, MD Inpatient       Chief Complaint  Patient presents with  . Acute Visit    Sarcoidosis    HISTORY OF PRESENT ILLNESS:  This is a 55 year old female who complained of having SOB. No wheezing nor rales noted. She was put on O2 @ 2L/min via [place]. She reported being on tapering dose of Prednisone before and has helped her.   She has been admitted to [facility] on 07/16/15 from [facility] with left knee osteoarthritis for which she had left total knee arthroplasty on 07/13/15.  She has been admitted for a short-term rehabilitation.   PAST MEDICAL HISTORY:  Past Medical History  Diagnosis Date  . Osteoarthritis   . Sarcoidosis ([place])     Dr. Lolita Patella  . Anxiety   . Vitamin D deficiency     is supposed to take Vit D but can't afford it  . Allergic rhinitis   . ALLERGIC RHINITIS 10/26/2009  . ANXIETY 08/14/2006  . B12 DEFICIENCY 08/25/2007  . VITAMIN D DEFICIENCY 08/25/2007  . Hypertension     takes Coreg daily  . DYSPNEA 04/28/2009    with exertion  . Shortness of breath   . Pneumonia     over 30 yrs ago  . Headache     last migraine 2-73yrs ago;takes Topamax daily  . Confusion   . Short-term memory loss   . Long-term memory loss   . TIA  (transient ischemic attack)   . Joint pain   . Joint swelling   . Rheumatoid arthritis ([place])   . Osteoarthritis   . Lichen planus   . Insomnia     takes Nortriptyline nightly   . Esophageal reflux     takes Nexium daily  . Depression     takes Lexapro daily  . Urinary urgency   . Nocturia   . Diabetes mellitus without complication ([place])     was on insulin but has been off since Nov 2015 and now only takes Metformin daily  . OSA (obstructive sleep apnea)     doesn't use CPAP;sleep study in epic from 2006  . History of shingles   . Fibromyalgia   . Unsteady gait   . Protein calorie malnutrition ([place])   . Left knee pain      CURRENT MEDICATIONS: Reviewed  Patient's Medications  New Prescriptions   No medications on file  Previous Medications   ALBUTEROL (PROVENTIL HFA;VENTOLIN HFA) 108 (90 BASE) MCG/ACT INHALER    Inhale 2 puffs into the lungs every 6 (six) hours as needed for wheezing or shortness of breath.   BLOOD GLUCOSE MONITORING SUPPL (FREESTYLE LITE) DEVI  Use to check blood sugar 2 times per day   CARVEDILOL (COREG) 12.5 MG TABLET    Take 1 tablet (12.5 mg total) by mouth 2 (two) times daily with a meal.   CHLORHEXIDINE (PERIDEX) 0.12 % SOLUTION    Use as directed 5 mLs in the mouth or throat 2 (two) times daily as needed.    CLOBETASOL (TEMOVATE) 0.05 % EXTERNAL SOLUTION    Apply 1 application topically 2 (two) times daily.   DOCUSATE SODIUM (COLACE) 100 MG CAPSULE    Take 100 mg by mouth.   ERGOCALCIFEROL (VITAMIN D2) 50000 UNITS CAPSULE    Take 1 capsule (50,000 Units total) by mouth every 30 (thirty) days.   ESCITALOPRAM (LEXAPRO) 20 MG TABLET    TAKE 1 TABLET BY MOUTH DAILY   FUROSEMIDE (LASIX) 20 MG TABLET    Take 1 tablet (20 mg total) by mouth daily.   GABAPENTIN (NEURONTIN) 300 MG CAPSULE    Take 300 mg by mouth 2 (two) times daily.   GLUCOSE BLOOD (FREESTYLE LITE) TEST STRIP    TEST TWICE DAILY   HYDROQUINONE 4 % CREAM    APPLY TO THE DARK SPOTS ON THE SKIN  QHS FOR 3 MONTHS AS DIRECTED   KLOR-CON 10 10 MEQ TABLET    TAKE 1 TABLET BY MOUTH DAILY   LANCETS (FREESTYLE) LANCETS    1 each by Other route 2 (two) times daily. Use as instructed   METFORMIN (GLUCOPHAGE) 500 MG TABLET    Take 1,000 mg by mouth daily.   METHOCARBAMOL (ROBAXIN) 500 MG TABLET    Take 500 mg by mouth 2 (two) times daily.   NORTRIPTYLINE (PAMELOR) 75 MG CAPSULE    Take 1 capsule (75 mg total) by mouth at bedtime.   OMEPRAZOLE (PRILOSEC) 20 MG CAPSULE    Take 20 mg by mouth daily.   OXYCODONE (OXY IR/ROXICODONE) 5 MG IMMEDIATE RELEASE TABLET    Take 1-2 tablets (5-10 mg total) by mouth every 3 (three) hours as needed for breakthrough pain.   OXYCODONE (OXYCONTIN) 10 MG 12 HR TABLET    Take 1 tablet (10 mg total) by mouth every 12 (twelve) hours.   POLYETHYLENE GLYCOL (MIRALAX / GLYCOLAX) PACKET    Take 17 g by mouth daily.   PREDNISONE (DELTASONE) 10 MG TABLET    Take 10-20 mg by mouth daily with breakfast. Take 2 tabs to - 20 mg po qd x5 days and then 10 mg 1 tab po qd x5 days   PROTEIN (PROCEL) POWD    Take 2 scoop by mouth 2 (two) times daily.   RIVAROXABAN (XARELTO) 10 MG TABS TABLET    Take 1 tablet (10 mg total) by mouth daily with breakfast.   SUVOREXANT (BELSOMRA) 20 MG TABS    Take 20 mg by mouth at bedtime.   TOPIRAMATE (TOPAMAX) 50 MG TABLET    TAKE 1 TABLET BY MOUTH TWICE DAILY   VOLTAREN 1 % GEL    Apply 2 grams to bilateral lower legs QID PRN  Modified Medications   No medications on file  Discontinued Medications   BUTRANS 20 MCG/HR PTWK PATCH    Place 1 patch (20 mcg total) onto the skin every 7 (seven) days. Rotate sites. Not to be applied to same site within 21 days after previous application. Check placement of patch   ESOMEPRAZOLE (NEXIUM) 40 MG CAPSULE    TAKE 1 CAPSULE BY MOUTH EVERY DAY BEFORE BREAKFAST     Allergies  Allergen Reactions  .  Enalapril Maleate     REACTION: cough  . Hydrochlorothiazide     REACTION: Low potassium  . Hydroxychloroquine  Sulfate     REACTION: vision changes  . Iodine     Mother was intolerant--CODED on CT table---pt never tired.  . Latex     Hives  . Nickel Other (See Comments)    Pt has a titanium right knee - nickel causes skin irritations that forms into blisters and sores     REVIEW OF SYSTEMS:  GENERAL: no change in appetite, no fatigue, no weight changes, no fever, chills or weakness EYES: Denies change in vision, dry eyes, eye pain, itching or discharge EARS: Denies change in hearing, ringing in ears, or earache NOSE: Denies nasal congestion or epistaxis MOUTH and THROAT: Denies oral discomfort, gingival pain or bleeding, pain from teeth or hoarseness   RESPIRATORY: no cough, SOB, DOE, wheezing, hemoptysis CARDIAC: no chest pain, or palpitations GI: no abdominal pain, diarrhea, constipation, heart burn, nausea or vomiting GU: Denies dysuria, frequency, hematuria, incontinence, or discharge PSYCHIATRIC: Denies feeling of depression or anxiety. No report of hallucinations, insomnia, paranoia, or agitation   PHYSICAL EXAMINATION  GENERAL APPEARANCE: Well nourished. In no acute distress. Morbidly obese SKIN:  Left knee surgical incision is covered with Aquacel dressing, dry, no erythema  HEAD: Normal in size and contour. No evidence of trauma EYES: Lids open and close normally. No blepharitis, entropion or ectropion. PERRL. Conjunctivae are clear and sclerae are white. Lenses are without opacity EARS: Pinnae are normal. Patient hears normal voice tunes of the examiner MOUTH and THROAT: Lips are without lesions. Oral mucosa is moist and without lesions. Tongue is normal in shape, size, and color and without lesions NECK: supple, trachea midline, no neck masses, no thyroid tenderness, no thyromegaly LYMPHATICS: no LAN in the neck, no supraclavicular LAN RESPIRATORY: breathing is even & unlabored, BS CTAB; on O2 @ 2L/min via [place] CARDIAC: RRR, no murmur,no extra heart sounds, no edema GI: abdomen  soft, normal BS, no masses, no tenderness, no hepatomegaly, no splenomegaly EXTREMITIES:  Able to move X 4 extremities PSYCHIATRIC: Alert and oriented X 3. Affect and behavior are appropriate  LABS/RADIOLOGY: Labs reviewed: Basic Metabolic Panel:  Recent Labs  06/08/15 1033 07/04/15 1204 07/14/15 0716 07/19/15  NA 140 139 139 144  K 3.9 3.9 4.1 4.4  CL 107 106 109  --   CO2 28 26 22   --   GLUCOSE 104* 134* 118*  --   BUN 19 13 15 12   CREATININE 0.82 1.23* 1.09* 0.9  CALCIUM 9.7 9.3 8.6*  --    Liver Function Tests:  Recent Labs  04/03/15 1329 06/08/15 1033 07/19/15  AST 31 17 21   ALT 35 17 21  ALKPHOS 122* 90 132*  BILITOT 0.3 0.2  --   PROT 7.5 7.2  --   ALBUMIN 3.9 4.0  --    CBC:  Recent Labs  04/03/15 1329 06/08/15 1033 07/04/15 1204 07/14/15 0716 07/19/15  WBC 7.3 10.4 7.1 8.6 6.3  NEUTROABS 4.3 7.9*  --   --  4  HGB 13.4 14.5 12.4 12.7 10.9*  HCT 39.8 44.0 39.3 39.4 35*  MCV 92.5 93.8 98.0 97.0  --   PLT 265.0 274.0 236 222 229   CBG:  Recent Labs  07/15/15 2305 07/16/15 0701 07/16/15 1120  GLUCAP 129* 105* 142*     ASSESSMENT/PLAN:  Sarcoidosis - start Prednisone 10 mg take 2 tabs = 20 mg by mouth daily 5 days  then prednisone 10 mg 1 tab by mouth daily 5 days, O2 @ 2L/min via [place] PRN for SOB   Durenda Age, NP [employer] [phone]

## 2015-07-31 DIAGNOSIS — G894 Chronic pain syndrome: Secondary | ICD-10-CM | POA: Diagnosis not present

## 2015-07-31 DIAGNOSIS — E669 Obesity, unspecified: Secondary | ICD-10-CM | POA: Diagnosis not present

## 2015-07-31 DIAGNOSIS — Z79891 Long term (current) use of opiate analgesic: Secondary | ICD-10-CM | POA: Diagnosis not present

## 2015-07-31 DIAGNOSIS — Z79899 Other long term (current) drug therapy: Secondary | ICD-10-CM | POA: Diagnosis not present

## 2015-08-01 ENCOUNTER — Ambulatory Visit (INDEPENDENT_AMBULATORY_CARE_PROVIDER_SITE_OTHER): Payer: Medicare Other | Admitting: Endocrinology

## 2015-08-01 ENCOUNTER — Encounter: Payer: Self-pay | Admitting: Endocrinology

## 2015-08-01 VITALS — BP 108/73 | HR 75 | Temp 98.9°F | Ht 67.0 in | Wt 246.0 lb

## 2015-08-01 DIAGNOSIS — E1142 Type 2 diabetes mellitus with diabetic polyneuropathy: Secondary | ICD-10-CM

## 2015-08-01 NOTE — Patient Instructions (Addendum)
Please continue the same metformin.   Please come back for a follow-up appointment in 6 months.   good diet and exercise significantly improve the control of your diabetes.  please let me know if you wish to be referred to a dietician.   check your blood sugar once a day.  vary the time of day when you check, between before the 3 meals, and at bedtime.  also check if you have symptoms of your blood sugar being too high or too low.  please keep a record of the readings and bring it to your next appointment here.  You can write it on any piece of paper.  please call us sooner if your blood sugar goes below 70, or if you have a lot of readings over 200.

## 2015-08-01 NOTE — Progress Notes (Signed)
Subjective:    Patient ID: [name], female    DOB: [birth year], 55 y.o.   MRN: [id number]  HPI Pt returns for f/u of diabetes mellitus: DM type: 2 Dx'ed: 0000000 Complications: polyneuropathy Therapy: metformin. GDM: never DKA: never Severe hypoglycemia: never Pancreatitis: never Other: she took insulin from 2013-2015; She intermittently takes prednisone, for sarcoidosis; she cannot undergo weight-loss surgery, as she has medicaid.   Interval history: She is back on prednisone. pt states she feels well in general.  Specifically, SOB is less now Past Medical History  Diagnosis Date  . Osteoarthritis   . Sarcoidosis ([place])     Dr. Lolita Patella  . Anxiety   . Vitamin D deficiency     is supposed to take Vit D but can't afford it  . Allergic rhinitis   . ALLERGIC RHINITIS 10/26/2009  . ANXIETY 08/14/2006  . B12 DEFICIENCY 08/25/2007  . VITAMIN D DEFICIENCY 08/25/2007  . Hypertension     takes Coreg daily  . DYSPNEA 04/28/2009    with exertion  . Shortness of breath   . Pneumonia     over 30 yrs ago  . Headache     last migraine 2-26yrs ago;takes Topamax daily  . Confusion   . Short-term memory loss   . Long-term memory loss   . TIA (transient ischemic attack)   . Joint pain   . Joint swelling   . Rheumatoid arthritis ([place])   . Osteoarthritis   . Lichen planus   . Insomnia     takes Nortriptyline nightly   . Esophageal reflux     takes Nexium daily  . Depression     takes Lexapro daily  . Urinary urgency   . Nocturia   . Diabetes mellitus without complication ([place])     was on insulin but has been off since Nov 2015 and now only takes Metformin daily  . OSA (obstructive sleep apnea)     doesn't use CPAP;sleep study in epic from 2006  . History of shingles   . Fibromyalgia   . Unsteady gait   . Protein calorie malnutrition ([place])   . Left knee pain     Past Surgical History  Procedure Laterality Date  . Arthroscopic knee surgery Right 11-12-04  .  Appendectomy    . Endometrial ablation    . Axillary abcess irrigation and debridement  Jul & Aug2012  . Cyst removed from top of buttocks  at age 36  . Carpal tunnel release Left 05/23/2014    Procedure: CARPAL TUNNEL RELEASE;  Surgeon: Meredith Pel, MD;  Location: [facility];  Service: Orthopedics;  Laterality: Left;  . Total knee arthroplasty Right 11/15/2014    Procedure: TOTAL RIGHT KNEE ARTHROPLASTY;  Surgeon: Meredith Pel, MD;  Location: [facility];  Service: Orthopedics;  Laterality: Right;  . Total knee arthroplasty Left 07/13/2015    Procedure: LEFT TOTAL KNEE ARTHROPLASTY;  Surgeon: Meredith Pel, MD;  Location: [facility];  Service: Orthopedics;  Laterality: Left;    Social History   Social History  . Marital Status: Single    Spouse Name: N/A  . Number of Children: N/A  . Years of Education: N/A   Occupational History  . disabled    Social History Main Topics  . Smoking status: Former Smoker -- 0.50 packs/day for 10 years    Types: Cigarettes  . Smokeless tobacco: Never Used     Comment: quit smoking in 2004  . Alcohol Use: No  .  Drug Use: No  . Sexual Activity: Not Currently   Other Topics Concern  . Not on file   Social History Narrative   Single, broke up with partner in 2008.    Current Outpatient Prescriptions on File Prior to Visit  Medication Sig Dispense Refill  . albuterol (PROVENTIL HFA;VENTOLIN HFA) 108 (90 Base) MCG/ACT inhaler Inhale 2 puffs into the lungs every 6 (six) hours as needed for wheezing or shortness of breath. 1 Inhaler 3  . Blood Glucose Monitoring Suppl (FREESTYLE LITE) DEVI Use to check blood sugar 2 times per day 1 each 0  . carvedilol (COREG) 12.5 MG tablet Take 1 tablet (12.5 mg total) by mouth 2 (two) times daily with a meal. 180 tablet 3  . chlorhexidine (PERIDEX) 0.12 % solution Use as directed 5 mLs in the mouth or throat 2 (two) times daily as needed.   0  . clobetasol (TEMOVATE) 0.05 % external solution Apply 1 application  topically 2 (two) times daily.  3  . docusate sodium (COLACE) 100 MG capsule Take 100 mg by mouth.    . ergocalciferol (VITAMIN D2) 50000 units capsule Take 1 capsule (50,000 Units total) by mouth every 30 (thirty) days. 3 capsule 3  . escitalopram (LEXAPRO) 20 MG tablet TAKE 1 TABLET BY MOUTH DAILY 90 tablet 3  . furosemide (LASIX) 20 MG tablet Take 1 tablet (20 mg total) by mouth daily. 30 tablet 3  . gabapentin (NEURONTIN) 300 MG capsule Take 300 mg by mouth 2 (two) times daily.    [name] glucose blood (FREESTYLE LITE) test strip TEST TWICE DAILY 100 each 3  . hydroquinone 4 % cream APPLY TO THE DARK SPOTS ON THE SKIN QHS FOR 3 MONTHS AS DIRECTED  0  . KLOR-CON 10 10 MEQ tablet TAKE 1 TABLET BY MOUTH DAILY 90 tablet 3  . Lancets (FREESTYLE) lancets 1 each by Other route 2 (two) times daily. Use as instructed 100 each 12  . metFORMIN (GLUCOPHAGE) 500 MG tablet Take 1,000 mg by mouth daily.    . methocarbamol (ROBAXIN) 500 MG tablet Take 500 mg by mouth 2 (two) times daily.    [name] oxyCODONE (OXY IR/ROXICODONE) 5 MG immediate release tablet Take 1-2 tablets (5-10 mg total) by mouth every 3 (three) hours as needed for breakthrough pain. 60 tablet 0  . oxyCODONE (OXYCONTIN) 10 mg 12 hr tablet Take 1 tablet (10 mg total) by mouth every 12 (twelve) hours. 30 tablet 0  . polyethylene glycol (MIRALAX / GLYCOLAX) packet Take 17 g by mouth daily.    . predniSONE (DELTASONE) 10 MG tablet Take 10-20 mg by mouth daily with breakfast. Take 2 tabs to - 20 mg po qd x5 days and then 10 mg 1 tab po qd x5 days    . Protein (PROCEL) POWD Take 2 scoop by mouth 2 (two) times daily.    . rivaroxaban (XARELTO) 10 MG TABS tablet Take 1 tablet (10 mg total) by mouth daily with breakfast. 30 tablet 0  . Suvorexant (BELSOMRA) 20 MG TABS Take 20 mg by mouth at bedtime. 30 tablet 5  . topiramate (TOPAMAX) 50 MG tablet TAKE 1 TABLET BY MOUTH TWICE DAILY 180 tablet 2  . VOLTAREN 1 % GEL Apply 2 grams to bilateral lower legs QID PRN   2  . nortriptyline (PAMELOR) 75 MG capsule Take 1 capsule (75 mg total) by mouth at bedtime. 30 capsule 5  . omeprazole (PRILOSEC) 20 MG capsule Take 20 mg by mouth daily.    . [  DISCONTINUED] potassium chloride (K-DUR,KLOR-CON) 10 MEQ tablet TAKE 1 TABLET BY MOUTH DAILY 90 tablet 2   No current facility-administered medications on file prior to visit.    Allergies  Allergen Reactions  . Enalapril Maleate     REACTION: cough  . Hydrochlorothiazide     REACTION: Low potassium  . Hydroxychloroquine Sulfate     REACTION: vision changes  . Iodine     Mother was intolerant--CODED on CT table---pt never tired.  . Latex     Hives  . Nickel Other (See Comments)    Pt has a titanium right knee - nickel causes skin irritations that forms into blisters and sores    Family History  Problem Relation Age of Onset  . Hypertension    . Coronary artery disease      female 1st degree relative <60  . Heart failure      congestive  . Coronary artery disease      Female 1st degree relative <50  . Breast cancer      1st degree relative <50 S  . Heart disease Mother   . Kidney disease Mother   . Cancer Father     leukemia    BP 108/73 mmHg  Pulse 75  Temp(Src) 98.9 F (37.2 C) (Axillary)  Ht 5\' 7"  (1.702 m)  Wt 246 lb (111.585 kg)  BMI 38.52 kg/m2  SpO2 96%  Review of Systems She has regained the weight she lost as of last ov    Objective:   Physical Exam VITAL SIGNS:  See vs page GENERAL: no distress.  In wheelchair Pulses: dorsalis pedis intact bilat.   MSK: no deformity of the feet CV: no leg edema Skin:  no ulcer on the feet.  normal color and temp on the feet. Neuro: sensation is intact to touch on the feet, but slightly decreased from normal.    Lab Results  Component Value Date   HGBA1C 5.8* 07/04/2015      Assessment & Plan:  Type 2 DM: well-controlled.  Sarcoidosis: she should keep an a1c of approx 6%, as a defense against the effect of intermittent steroid  rx. Obesity: worse.  redouble dietary efforts.  Patient is advised the following: Patient Instructions  Please continue the same metformin.   Please come back for a follow-up appointment in 6 months.   good diet and exercise significantly improve the control of your diabetes.  please let me know if you wish to be referred to a dietician.   check your blood sugar once a day.  vary the time of day when you check, between before the 3 meals, and at bedtime.  also check if you have symptoms of your blood sugar being too high or too low.  please keep a record of the readings and bring it to your next appointment here.  You can write it on any piece of paper.  please call us sooner if your blood sugar goes below 70, or if you have a lot of readings over 200.     Renato Shin, MD

## 2015-08-03 ENCOUNTER — Encounter: Payer: Self-pay | Admitting: Adult Health

## 2015-08-03 ENCOUNTER — Non-Acute Institutional Stay (SKILLED_NURSING_FACILITY): Payer: Medicare Other | Admitting: Adult Health

## 2015-08-03 DIAGNOSIS — M1712 Unilateral primary osteoarthritis, left knee: Secondary | ICD-10-CM

## 2015-08-03 DIAGNOSIS — D869 Sarcoidosis, unspecified: Secondary | ICD-10-CM

## 2015-08-03 DIAGNOSIS — E1142 Type 2 diabetes mellitus with diabetic polyneuropathy: Secondary | ICD-10-CM

## 2015-08-03 DIAGNOSIS — E876 Hypokalemia: Secondary | ICD-10-CM | POA: Diagnosis not present

## 2015-08-03 DIAGNOSIS — I10 Essential (primary) hypertension: Secondary | ICD-10-CM | POA: Diagnosis not present

## 2015-08-03 DIAGNOSIS — R2681 Unsteadiness on feet: Secondary | ICD-10-CM | POA: Diagnosis not present

## 2015-08-03 DIAGNOSIS — E43 Unspecified severe protein-calorie malnutrition: Secondary | ICD-10-CM

## 2015-08-03 DIAGNOSIS — R600 Localized edema: Secondary | ICD-10-CM | POA: Diagnosis not present

## 2015-08-03 DIAGNOSIS — G43809 Other migraine, not intractable, without status migrainosus: Secondary | ICD-10-CM

## 2015-08-03 DIAGNOSIS — F339 Major depressive disorder, recurrent, unspecified: Secondary | ICD-10-CM | POA: Diagnosis not present

## 2015-08-03 DIAGNOSIS — G4700 Insomnia, unspecified: Secondary | ICD-10-CM | POA: Diagnosis not present

## 2015-08-03 NOTE — Progress Notes (Signed)
Patient ID: [name], female   DOB: 11/10/1960, 55 y.o.   MRN: [id number]    DATE:  08/03/15  MRN:  [id number]  BIRTHDAY: [birth year]  Facility:  [facility]ing Home Location:  [place] Room Number: [id number]  LEVEL OF CARE:  SNF [phone])  Contact Information    Name Relation Home Work [place] Son [phone]     [name] [phone]     [name] [phone]         Code Status History    Date Active Date Inactive Code Status Order ID Comments User Context   07/13/2015  5:47 PM 07/16/2015  4:31 PM Full Code [id number]  Meredith Pel, MD Inpatient   11/15/2014  6:15 PM 11/18/2014  5:55 PM Full Code [id number]  Meredith Pel, MD Inpatient       Chief Complaint  Patient presents with  . Discharge Note    HISTORY OF PRESENT ILLNESS:  This is a 55 year old female who is for discharge home with home health PT for endurance and OT for ADLs. She will discharge home with prescriptions.  She has been admitted to [facility] on 07/16/15 from [facility] with left knee osteoarthritis for which she had left total knee arthroplasty on 07/13/15.  Patient was admitted to this facility for short-term rehabilitation after the patient's recent hospitalization.  Patient has completed SNF rehabilitation and therapy has cleared the patient for discharge.   PAST MEDICAL HISTORY:  Past Medical History  Diagnosis Date  . Osteoarthritis   . Sarcoidosis ([place])     Dr. Lolita Patella  . Anxiety   . Vitamin D deficiency     is supposed to take Vit D but can't afford it  . Allergic rhinitis   . ALLERGIC RHINITIS 10/26/2009  . ANXIETY 08/14/2006  . B12 DEFICIENCY 08/25/2007  . VITAMIN D DEFICIENCY 08/25/2007  . Hypertension     takes Coreg daily  . DYSPNEA 04/28/2009    with exertion  . Shortness of breath   . Pneumonia     over 30 yrs ago  . Headache     last migraine 2-54yrs ago;takes Topamax daily   . Confusion   . Short-term memory loss   . Long-term memory loss   . TIA (transient ischemic attack)   . Joint pain   . Joint swelling   . Rheumatoid arthritis ([place])   . Osteoarthritis   . Lichen planus   . Insomnia     takes Nortriptyline nightly   . Esophageal reflux     takes Nexium daily  . Depression     takes Lexapro daily  . Urinary urgency   . Nocturia   . Diabetes mellitus without complication ([place])     was on insulin but has been off since Nov 2015 and now only takes Metformin daily  . OSA (obstructive sleep apnea)     doesn't use CPAP;sleep study in epic from 2006  . History of shingles   . Fibromyalgia   . Unsteady gait   . Protein calorie malnutrition ([place])   . Left knee pain      CURRENT MEDICATIONS: Reviewed  Patient's Medications  New Prescriptions   No medications on file  Previous Medications   ALBUTEROL (PROVENTIL HFA;VENTOLIN HFA) 108 (90 BASE) MCG/ACT INHALER    Inhale 2 puffs into the lungs every 6 (six) hours as needed for wheezing or shortness of breath.   BLOOD GLUCOSE MONITORING SUPPL (  FREESTYLE LITE) DEVI    Use to check blood sugar 2 times per day   CARVEDILOL (COREG) 12.5 MG TABLET    Take 1 tablet (12.5 mg total) by mouth 2 (two) times daily with a meal.   CHLORHEXIDINE (PERIDEX) 0.12 % SOLUTION    Use as directed 5 mLs in the mouth or throat 2 (two) times daily as needed.    CLOBETASOL (TEMOVATE) 0.05 % EXTERNAL SOLUTION    Apply 1 application topically 2 (two) times daily.   DOCUSATE SODIUM (COLACE) 100 MG CAPSULE    Take 100 mg by mouth 2 (two) times daily.    ERGOCALCIFEROL (VITAMIN D2) 50000 UNITS CAPSULE    Take 1 capsule (50,000 Units total) by mouth every 30 (thirty) days.   ESCITALOPRAM (LEXAPRO) 20 MG TABLET    TAKE 1 TABLET BY MOUTH DAILY   FUROSEMIDE (LASIX) 20 MG TABLET    Take 1 tablet (20 mg total) by mouth daily.   GABAPENTIN (NEURONTIN) 300 MG CAPSULE    Take 300 mg by mouth 2 (two) times daily.   GLUCOSE BLOOD (FREESTYLE  LITE) TEST STRIP    TEST TWICE DAILY   HYDROQUINONE 4 % CREAM    APPLY TO THE DARK SPOTS ON THE SKIN QHS FOR 3 MONTHS AS DIRECTED   KLOR-CON 10 10 MEQ TABLET    TAKE 1 TABLET BY MOUTH DAILY   LANCETS (FREESTYLE) LANCETS    1 each by Other route 2 (two) times daily. Use as instructed   METFORMIN (GLUCOPHAGE) 500 MG TABLET    Take 1,000 mg by mouth daily.   METHOCARBAMOL (ROBAXIN) 500 MG TABLET    Take 500 mg by mouth 2 (two) times daily.   NORTRIPTYLINE (PAMELOR) 75 MG CAPSULE    Take 1 capsule (75 mg total) by mouth at bedtime.   OMEPRAZOLE (PRILOSEC) 20 MG CAPSULE    Take 20 mg by mouth daily.   OXYCODONE (OXY IR/ROXICODONE) 5 MG IMMEDIATE RELEASE TABLET    Take 1-2 tablets (5-10 mg total) by mouth every 3 (three) hours as needed for breakthrough pain.   POLYETHYLENE GLYCOL (MIRALAX / GLYCOLAX) PACKET    Take 17 g by mouth daily.   PREDNISONE (DELTASONE) 10 MG TABLET    Take 10-20 mg by mouth daily with breakfast. Take 2 tabs to = 20 mg po qd x5 days and then 10 mg 1 tab po qd x5 days   PROTEIN (PROCEL) POWD    Take 2 scoop by mouth 2 (two) times daily.   RIVAROXABAN (XARELTO) 10 MG TABS TABLET    Take 1 tablet (10 mg total) by mouth daily with breakfast.   SUVOREXANT (BELSOMRA) 20 MG TABS    Take 20 mg by mouth at bedtime.   TOPIRAMATE (TOPAMAX) 50 MG TABLET    TAKE 1 TABLET BY MOUTH TWICE DAILY  Modified Medications   No medications on file  Discontinued Medications   OXYCODONE (OXYCONTIN) 10 MG 12 HR TABLET    Take 1 tablet (10 mg total) by mouth every 12 (twelve) hours.   VOLTAREN 1 % GEL    Apply 2 grams to bilateral lower legs QID PRN     Allergies  Allergen Reactions  . Enalapril Maleate     REACTION: cough  . Hydrochlorothiazide     REACTION: Low potassium  . Hydroxychloroquine Sulfate     REACTION: vision changes  . Iodine     Mother was intolerant--CODED on CT table---pt never tired.  . Latex  Hives  . Nickel Other (See Comments)    Pt has a titanium right knee -  nickel causes skin irritations that forms into blisters and sores     REVIEW OF SYSTEMS:  GENERAL: no change in appetite, no fatigue, no weight changes, no fever, chills or weakness EYES: Denies change in vision, dry eyes, eye pain, itching or discharge EARS: Denies change in hearing, ringing in ears, or earache NOSE: Denies nasal congestion or epistaxis MOUTH and THROAT: Denies oral discomfort, gingival pain or bleeding, pain from teeth or hoarseness   RESPIRATORY: no cough, SOB, DOE, wheezing, hemoptysis CARDIAC: no chest pain, or palpitations GI: no abdominal pain, diarrhea, constipation, heart burn, nausea or vomiting GU: Denies dysuria, frequency, hematuria, incontinence, or discharge PSYCHIATRIC: Denies feeling of depression or anxiety. No report of hallucinations, insomnia, paranoia, or agitation   PHYSICAL EXAMINATION  GENERAL APPEARANCE: Well nourished. In no acute distress. Morbidly obese SKIN:  Left knee surgical incision has steri-strips, dry, no erythema HEAD: Normal in size and contour. No evidence of trauma EYES: Lids open and close normally. No blepharitis, entropion or ectropion. PERRL. Conjunctivae are clear and sclerae are white. Lenses are without opacity EARS: Pinnae are normal. Patient hears normal voice tunes of the examiner MOUTH and THROAT: Lips are without lesions. Oral mucosa is moist and without lesions. Tongue is normal in shape, size, and color and without lesions NECK: supple, trachea midline, no neck masses, no thyroid tenderness, no thyromegaly LYMPHATICS: no LAN in the neck, no supraclavicular LAN RESPIRATORY: breathing is even & unlabored, BS CTAB CARDIAC: RRR, no murmur,no extra heart sounds, no edema GI: abdomen soft, normal BS, no masses, no tenderness, no hepatomegaly, no splenomegaly EXTREMITIES:  Able to move X 4 extremities PSYCHIATRIC: Alert and oriented X 3. Affect and behavior are appropriate  LABS/RADIOLOGY: Labs reviewed: Basic  Metabolic Panel:  Recent Labs  06/08/15 1033 07/04/15 1204 07/14/15 0716 07/19/15  NA 140 139 139 144  K 3.9 3.9 4.1 4.4  CL 107 106 109  --   CO2 28 26 22   --   GLUCOSE 104* 134* 118*  --   BUN 19 13 15 12   CREATININE 0.82 1.23* 1.09* 0.9  CALCIUM 9.7 9.3 8.6*  --    Liver Function Tests:  Recent Labs  04/03/15 1329 06/08/15 1033 07/19/15  AST 31 17 21   ALT 35 17 21  ALKPHOS 122* 90 132*  BILITOT 0.3 0.2  --   PROT 7.5 7.2  --   ALBUMIN 3.9 4.0  --    CBC:  Recent Labs  04/03/15 1329 06/08/15 1033 07/04/15 1204 07/14/15 0716 07/19/15  WBC 7.3 10.4 7.1 8.6 6.3  NEUTROABS 4.3 7.9*  --   --  4  HGB 13.4 14.5 12.4 12.7 10.9*  HCT 39.8 44.0 39.3 39.4 35*  MCV 92.5 93.8 98.0 97.0  --   PLT 265.0 274.0 236 222 229   CBG:  Recent Labs  07/15/15 2305 07/16/15 0701 07/16/15 1120  GLUCAP 129* 105* 142*     ASSESSMENT/PLAN:  Unsteady gait - for home health PT and OT; fall precaution  Left knee osteoarthritis S/P left total knee arthroplasty - follow-up with orthopedic; continue Robaxin 500 mg 1 tab by mouth twice a day for muscle spasm; Xarelto 10 mg 1 tab by mouth daily 13 days for DVT prophylaxis; oxycodone 5 mg 1-2 tabs by mouth every 3 hours when necessary for pain; Butrans patch was recently discontinued per patient request; for home health PT and  OT  Constipation - continue Colace 100 mg 1 capsule by mouth twice a day, MiraLAX 17 g by mouth daily when necessary  Protein calorie malnutrition, severe - albumin 2.90; continue Procel 2 scoops by mouth twice a day  Chronic Depression - continue Escitalopram 20 mg 1 tab by mouth daily, nortriptyline 75 mg 1 capsule by mouth daily at bedtime  Leg edema - continue Lasix 20 mg daily  Neuropathic pain - continue Neurontin 300 mg by mouth twice a day  Hypokalemia - continue KCL ER 10 meq 1 tab by mouth daily Lab Results  Component Value Date   K 4.4 07/19/2015    Insomnia - continue Belsomra 20 mg 1 tab  by mouth daily at bedtime  Diabetes mellitus, type II with neuropathy - continue metformin 500 mg take 2 tabs = 1000 mg by mouth daily Lab Results  Component Value Date   HGBA1C 5.8* 07/04/2015   Sarcoidosis - start Prednisone 10 mg take  1 tab by mouth daily 5 days, O2 @ 2L/min via [place] PRN for SOB  Migraine - continue Topiramate 50 mg 1 tab twice a day     I have filled out patient's discharge paperwork and written prescriptions.  Patient will receive home health PT and OT.  DME provided:  None  Total discharge time: Greater than 30 minutes  Discharge time involved coordination of the discharge process with social worker, nursing staff and therapy department. Medical justification for home health services verified.     Durenda Age, NP [employer] [phone]

## 2015-08-06 DIAGNOSIS — M797 Fibromyalgia: Secondary | ICD-10-CM | POA: Diagnosis not present

## 2015-08-06 DIAGNOSIS — D869 Sarcoidosis, unspecified: Secondary | ICD-10-CM | POA: Diagnosis not present

## 2015-08-06 DIAGNOSIS — Z471 Aftercare following joint replacement surgery: Secondary | ICD-10-CM | POA: Diagnosis not present

## 2015-08-06 DIAGNOSIS — E119 Type 2 diabetes mellitus without complications: Secondary | ICD-10-CM | POA: Diagnosis not present

## 2015-08-06 DIAGNOSIS — M069 Rheumatoid arthritis, unspecified: Secondary | ICD-10-CM | POA: Diagnosis not present

## 2015-08-06 DIAGNOSIS — M1990 Unspecified osteoarthritis, unspecified site: Secondary | ICD-10-CM | POA: Diagnosis not present

## 2015-08-07 ENCOUNTER — Ambulatory Visit: Payer: Medicare Other | Admitting: Pulmonary Disease

## 2015-08-07 ENCOUNTER — Telehealth: Payer: Self-pay | Admitting: *Deleted

## 2015-08-07 NOTE — Telephone Encounter (Signed)
Patient called and stated that she was released from [facility] last week and [facility] did not sign her pain medication Rx for Oxycodone 5mg .  I instructed her to call [facility] and speak the the DON. She agreed.

## 2015-08-08 ENCOUNTER — Encounter: Payer: Self-pay | Admitting: Internal Medicine

## 2015-08-09 DIAGNOSIS — M1990 Unspecified osteoarthritis, unspecified site: Secondary | ICD-10-CM | POA: Diagnosis not present

## 2015-08-09 DIAGNOSIS — E119 Type 2 diabetes mellitus without complications: Secondary | ICD-10-CM | POA: Diagnosis not present

## 2015-08-09 DIAGNOSIS — D869 Sarcoidosis, unspecified: Secondary | ICD-10-CM | POA: Diagnosis not present

## 2015-08-09 DIAGNOSIS — M069 Rheumatoid arthritis, unspecified: Secondary | ICD-10-CM | POA: Diagnosis not present

## 2015-08-09 DIAGNOSIS — M797 Fibromyalgia: Secondary | ICD-10-CM | POA: Diagnosis not present

## 2015-08-09 DIAGNOSIS — Z471 Aftercare following joint replacement surgery: Secondary | ICD-10-CM | POA: Diagnosis not present

## 2015-08-11 ENCOUNTER — Telehealth: Payer: Self-pay | Admitting: Pulmonary Disease

## 2015-08-11 DIAGNOSIS — Z471 Aftercare following joint replacement surgery: Secondary | ICD-10-CM | POA: Diagnosis not present

## 2015-08-11 DIAGNOSIS — M797 Fibromyalgia: Secondary | ICD-10-CM | POA: Diagnosis not present

## 2015-08-11 DIAGNOSIS — E119 Type 2 diabetes mellitus without complications: Secondary | ICD-10-CM | POA: Diagnosis not present

## 2015-08-11 DIAGNOSIS — M069 Rheumatoid arthritis, unspecified: Secondary | ICD-10-CM | POA: Diagnosis not present

## 2015-08-11 DIAGNOSIS — D869 Sarcoidosis, unspecified: Secondary | ICD-10-CM | POA: Diagnosis not present

## 2015-08-11 DIAGNOSIS — M1990 Unspecified osteoarthritis, unspecified site: Secondary | ICD-10-CM | POA: Diagnosis not present

## 2015-08-11 MED ORDER — PREDNISONE 10 MG PO TABS: ORAL_TABLET | ORAL | 0 refills | Status: DC

## 2015-08-11 NOTE — Telephone Encounter (Signed)
Spoke with pt. States that she needs a prednisone taper. Reports increased SOB. Denies chest tightness, wheezing or coughing. Could not give me a straight answer about when her SOB started  RA - please advise.  Thanks.

## 2015-08-11 NOTE — Telephone Encounter (Signed)
Prednisone 10 mg tabs  Take 2 tabs daily with food x 5ds, then 1 tab daily with food x 5ds then STOP  Arrange for OV next week pl

## 2015-08-11 NOTE — Telephone Encounter (Signed)
Pt is aware of RA's recommendation. Rx has been sent in. OV has been scheduled for 08/15/15 at 9:30am with TP.

## 2015-08-14 DIAGNOSIS — E119 Type 2 diabetes mellitus without complications: Secondary | ICD-10-CM | POA: Diagnosis not present

## 2015-08-14 DIAGNOSIS — M069 Rheumatoid arthritis, unspecified: Secondary | ICD-10-CM | POA: Diagnosis not present

## 2015-08-14 DIAGNOSIS — M797 Fibromyalgia: Secondary | ICD-10-CM | POA: Diagnosis not present

## 2015-08-14 DIAGNOSIS — Z471 Aftercare following joint replacement surgery: Secondary | ICD-10-CM | POA: Diagnosis not present

## 2015-08-14 DIAGNOSIS — D869 Sarcoidosis, unspecified: Secondary | ICD-10-CM | POA: Diagnosis not present

## 2015-08-14 DIAGNOSIS — M1990 Unspecified osteoarthritis, unspecified site: Secondary | ICD-10-CM | POA: Diagnosis not present

## 2015-08-15 ENCOUNTER — Encounter: Payer: Self-pay | Admitting: Adult Health

## 2015-08-15 ENCOUNTER — Ambulatory Visit (INDEPENDENT_AMBULATORY_CARE_PROVIDER_SITE_OTHER)
Admission: RE | Admit: 2015-08-15 | Discharge: 2015-08-15 | Disposition: A | Discharge status: Home / Self Care | Payer: Medicare Other | Source: Ambulatory Visit | Attending: Adult Health | Admitting: Adult Health

## 2015-08-15 ENCOUNTER — Other Ambulatory Visit (INDEPENDENT_AMBULATORY_CARE_PROVIDER_SITE_OTHER): Payer: Medicare Other

## 2015-08-15 ENCOUNTER — Ambulatory Visit (INDEPENDENT_AMBULATORY_CARE_PROVIDER_SITE_OTHER): Payer: Medicare Other | Admitting: Adult Health

## 2015-08-15 DIAGNOSIS — D869 Sarcoidosis, unspecified: Secondary | ICD-10-CM | POA: Diagnosis not present

## 2015-08-15 DIAGNOSIS — R0602 Shortness of breath: Secondary | ICD-10-CM | POA: Diagnosis not present

## 2015-08-15 LAB — CBC WITH DIFFERENTIAL/PLATELET
BASOS ABS: 0.1 10*3/uL (ref 0.0–0.1)
Basophils Relative: 0.5 % (ref 0.0–3.0)
EOS ABS: 0.1 10*3/uL (ref 0.0–0.7)
Eosinophils Relative: 1.1 % (ref 0.0–5.0)
HEMATOCRIT: 39.9 % (ref 36.0–46.0)
HEMOGLOBIN: 13.1 g/dL (ref 12.0–15.0)
LYMPHS PCT: 16.7 % (ref 12.0–46.0)
Lymphs Abs: 2 10*3/uL (ref 0.7–4.0)
MCHC: 32.8 g/dL (ref 30.0–36.0)
MCV: 91.3 fl (ref 78.0–100.0)
MONOS PCT: 5.6 % (ref 3.0–12.0)
Monocytes Absolute: 0.7 10*3/uL (ref 0.1–1.0)
NEUTROS ABS: 9.3 10*3/uL — AB (ref 1.4–7.7)
Neutrophils Relative %: 76.1 % (ref 43.0–77.0)
PLATELETS: 218 10*3/uL (ref 150.0–400.0)
RBC: 4.37 Mil/uL (ref 3.87–5.11)
RDW: 13.5 % (ref 11.5–15.5)
WBC: 12.2 10*3/uL — AB (ref 4.0–10.5)

## 2015-08-15 LAB — BASIC METABOLIC PANEL
BUN: 18 mg/dL (ref 6–23)
CALCIUM: 9.6 mg/dL (ref 8.4–10.5)
CHLORIDE: 105 meq/L (ref 96–112)
CO2: 26 meq/L (ref 19–32)
CREATININE: 0.9 mg/dL (ref 0.40–1.20)
GFR: 69.08 mL/min (ref >60.00)
Glucose, Bld: 153 mg/dL — ABNORMAL HIGH (ref 70–99)
Potassium: 3.5 mEq/L (ref 3.5–5.1)
SODIUM: 140 meq/L (ref 135–145)

## 2015-08-15 MED ORDER — PREDNISONE 10 MG PO TABS: ORAL_TABLET | ORAL | 0 refills | Status: DC

## 2015-08-15 NOTE — Assessment & Plan Note (Signed)
Possible flare, stand steroids out for a slow taper over the next 2-3 weeks.. Advised to check her blood sugars daily and call if blood sugars greater than 250. Patient is a diabetic. Advised on steroid side effects Check ACE level.   Plan  Extend Prednisone 20mg  daily for 2 weeks then 10mg  daily for 1 week then 5mg  daily for 1 week and stop.  Check blood sugars daily , call if >250.  Labs and chest xray today .  Follow up Dr. Elsworth Soho  In 2-3 months and As needed    Please contact office for sooner follow up if symptoms do not improve or worsen or seek emergency care

## 2015-08-15 NOTE — Assessment & Plan Note (Signed)
?   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   Plan  Extend Prednisone 20mg  daily for 2 weeks then 10mg  daily for 1 week then 5mg  daily for 1 week and stop.  Check blood sugars daily , call if >250.  Labs and chest xray today .  Follow up Dr. Elsworth Soho  In 2-3 months and As needed    Please contact office for sooner follow up if symptoms do not improve or worsen or seek emergency care

## 2015-08-15 NOTE — Addendum Note (Signed)
Addended by: Osa Craver on: 08/15/2015 10:19 AM   Modules accepted: Orders

## 2015-08-15 NOTE — Patient Instructions (Addendum)
Extend Prednisone 20mg  daily for 2 weeks then 10mg  daily for 1 week then 5mg  daily for 1 week and stop.  Check blood sugars daily , call if >250.  Labs and chest xray today .  Follow up Dr. Elsworth Soho  In 2-3 months and As needed    Please contact office for sooner follow up if symptoms do not improve or worsen or seek emergency care

## 2015-08-15 NOTE — Progress Notes (Signed)
Subjective:    Patient ID: [name], female    DOB: 1960/09/28, 55 y.o.   MRN: [id number]  HPI 55 yo female remote Followed for pulmonary sarcoidosis, nodules and dyspnea on exertion, felt secondary to her morbid obesity, debility, deconditioning She has lichen planus followed dermatology, Dr. Ubaldo Glassing.   08/15/2015 Acute OV  Patient presents for an acute office visit. Called into the office last week with increased shortness of breath Patient says that over the last 2 weeks. She's been experiencing increased shortness of breath with activity, especially with walking. She feels that is her sarcoid flaring. She's been on 3 courses of steroids over the last 3 months.  Most recent steroid taper was called in last week. However, she began yesterday. She is currently on 63m g of prednisone. She had first knee replacement in November 2016.[name] She had her second knee replacement on the left last month. She was on Xarelto during her rehabilitation process. It has been off of this for the last 2 weeks.. She says that her knee is feeling much better. She does have pain along her calf. Over the last 2 months on the left. She's had 2 venous Dopplers prior to surgery that were negative for DVT.   Denies any wheezing, sinus pressure/drainage, chest congestion/tightness, fever, nasuea or vomiting , cough or rash..  Past Medical History:  Diagnosis Date  . Allergic rhinitis   . ALLERGIC RHINITIS 10/26/2009  . Anxiety   . ANXIETY 08/14/2006  . B12 DEFICIENCY 08/25/2007  . Confusion   . Depression    takes Lexapro daily  . Diabetes mellitus without complication ([place])    was on insulin but has been off since Nov 2015 and now only takes Metformin daily  . DYSPNEA 04/28/2009   with exertion  . Esophageal reflux    takes Nexium daily  . Fibromyalgia   . Headache    last migraine 2-25yrs ago;takes Topamax daily  . History of shingles   . Hypertension    takes Coreg daily  . Insomnia    takes  Nortriptyline nightly   . Joint pain   . Joint swelling   . Left knee pain   . Lichen planus   . Long-term memory loss   . Nocturia   . OSA (obstructive sleep apnea)    doesn't use CPAP;sleep study in epic from 2006  . Osteoarthritis   . Osteoarthritis   . Pneumonia    over 30 yrs ago  . Protein calorie malnutrition ([place])   . Rheumatoid arthritis ([place])   . Sarcoidosis ([place])    Dr. Lolita Patella  . Short-term memory loss   . Shortness of breath   . TIA (transient ischemic attack)   . Unsteady gait   . Urinary urgency   . Vitamin D deficiency    is supposed to take Vit D but can't afford it  . VITAMIN D DEFICIENCY 08/25/2007   Current Outpatient Prescriptions on File Prior to Visit  Medication Sig Dispense Refill  . albuterol (PROVENTIL HFA;VENTOLIN HFA) 108 (90 Base) MCG/ACT inhaler Inhale 2 puffs into the lungs every 6 (six) hours as needed for wheezing or shortness of breath. 1 Inhaler 3  . Blood Glucose Monitoring Suppl (FREESTYLE LITE) DEVI Use to check blood sugar 2 times per day 1 each 0  . carvedilol (COREG) 12.5 MG tablet Take 1 tablet (12.5 mg total) by mouth 2 (two) times daily with a meal. 180 tablet 3  . chlorhexidine (PERIDEX) 0.12 % solution  Use as directed 5 mLs in the mouth or throat 2 (two) times daily as needed.   0  . clobetasol (TEMOVATE) 0.05 % external solution Apply 1 application topically 2 (two) times daily.  3  . ergocalciferol (VITAMIN D2) 50000 units capsule Take 1 capsule (50,000 Units total) by mouth every 30 (thirty) days. 3 capsule 3  . escitalopram (LEXAPRO) 20 MG tablet TAKE 1 TABLET BY MOUTH DAILY 90 tablet 3  . furosemide (LASIX) 20 MG tablet Take 1 tablet (20 mg total) by mouth daily. 30 tablet 3  . gabapentin (NEURONTIN) 300 MG capsule Take 300 mg by mouth 2 (two) times daily.    [name] glucose blood (FREESTYLE LITE) test strip TEST TWICE DAILY 100 each 3  . hydroquinone 4 % cream APPLY TO THE DARK SPOTS ON THE SKIN QHS FOR 3 MONTHS AS DIRECTED  0  .  KLOR-CON 10 10 MEQ tablet TAKE 1 TABLET BY MOUTH DAILY 90 tablet 3  . Lancets (FREESTYLE) lancets 1 each by Other route 2 (two) times daily. Use as instructed 100 each 12  . metFORMIN (GLUCOPHAGE) 500 MG tablet Take 1,000 mg by mouth daily.    . nortriptyline (PAMELOR) 75 MG capsule Take 1 capsule (75 mg total) by mouth at bedtime. 30 capsule 5  . omeprazole (PRILOSEC) 20 MG capsule Take 20 mg by mouth daily.    [name] oxyCODONE (OXY IR/ROXICODONE) 5 MG immediate release tablet Take 1-2 tablets (5-10 mg total) by mouth every 3 (three) hours as needed for breakthrough pain. 60 tablet 0  . predniSONE (DELTASONE) 10 MG tablet Take 2 tabs daily with food x 5ds, then 1 tab daily with food x 5ds then STOP 15 tablet 0  . rivaroxaban (XARELTO) 10 MG TABS tablet Take 1 tablet (10 mg total) by mouth daily with breakfast. 30 tablet 0  . Suvorexant (BELSOMRA) 20 MG TABS Take 20 mg by mouth at bedtime. 30 tablet 5  . topiramate (TOPAMAX) 50 MG tablet TAKE 1 TABLET BY MOUTH TWICE DAILY 180 tablet 2  . docusate sodium (COLACE) 100 MG capsule Take 100 mg by mouth 2 (two) times daily.     . methocarbamol (ROBAXIN) 500 MG tablet Take 500 mg by mouth 2 (two) times daily.    . polyethylene glycol (MIRALAX / GLYCOLAX) packet Take 17 g by mouth daily.    . [DISCONTINUED] potassium chloride (K-DUR,KLOR-CON) 10 MEQ tablet TAKE 1 TABLET BY MOUTH DAILY 90 tablet 2   No current facility-administered medications on file prior to visit.       Review of Systems Constitutional:   No  weight loss, night sweats,  Fevers, chills,  +fatigue, or  lassitude.  HEENT:   No headaches,  Difficulty swallowing,  Tooth/dental problems, or  Sore throat,                No sneezing, itching, ear ache +, nasal congestion, post nasal drip,   CV:  No chest pain,  Orthopnea, PND, swelling in lower extremities, anasarca, dizziness, palpitations, syncope.   GI  No heartburn, indigestion, abdominal pain, nausea, vomiting, diarrhea, change in  bowel habits, loss of appetite, bloody stools.   Resp:   No excess mucus, no productive cough,  No non-productive cough,  No coughing up of blood.  No change in color of mucus.  No wheezing.  No chest wall deformity  Skin: no rash or lesions.  GU: no dysuria, change in color of urine, no urgency or frequency.  No flank pain,  no hematuria   MS:  +joint pain.  No decreased range of motion.  No back pain.  Psych:  No change in mood or affect. No depression or anxiety.  No memory loss.         Objective:   Physical Exam Vitals:   08/15/15 0932  BP: 110/74  Pulse: 84  Temp: (!) 96.5 F (35.8 C)  TempSrc: Axillary  SpO2: 94%  Weight: 253 lb (114.8 kg)  Height: 5\' 7"  (1.702 m)   GEN: A/Ox3; pleasant , NAD, Morbidly obese, walks with a walker   HEENT:  [place]/AT,  EACs-clear, TMs-wnl, NOSE- crusted mucus, THROAT-clear, no lesions, no postnasal drip or exudate noted.   NECK:  Supple w/ fair ROM; no JVD; normal carotid impulses w/o bruits; no thyromegaly or nodules palpated; no lymphadenopathy.    RESP  Clear  P & A; w/o, wheezes/ rales/ or rhonchi. no accessory muscle use, no dullness to percussion  CARD:  RRR, no m/r/g  , 1+ peripheral edema L>R , pulses intact, no cyanosis or clubbing.  GI:   Soft & nt; nml bowel sounds; no organomegaly or masses detected.   Musco: Warm bil, no deformities or joint swelling noted.   Neuro: alert, no focal deficits noted.    Skin: Warm, no lesions or rashes  Tammy Parrett NP-C  [facility]  08/15/2015

## 2015-08-16 ENCOUNTER — Other Ambulatory Visit: Payer: Self-pay | Admitting: Adult Health

## 2015-08-16 ENCOUNTER — Telehealth: Payer: Self-pay | Admitting: Adult Health

## 2015-08-16 ENCOUNTER — Ambulatory Visit: Payer: Medicare Other | Admitting: Pulmonary Disease

## 2015-08-16 DIAGNOSIS — R0602 Shortness of breath: Secondary | ICD-10-CM

## 2015-08-16 DIAGNOSIS — R7989 Other specified abnormal findings of blood chemistry: Secondary | ICD-10-CM

## 2015-08-16 LAB — ANGIOTENSIN CONVERTING ENZYME: ANGIOTENSIN-CONVERTING ENZYME: 38 U/L (ref 8–52)

## 2015-08-16 LAB — D-DIMER, QUANTITATIVE: D-Dimer, Quant: 2.76 mcg/mL FEU — ABNORMAL HIGH (ref <0.50)

## 2015-08-16 NOTE — Progress Notes (Signed)
Called spoke with pt. Informed her of results and recs. STAT CT order was placed. Pt states she is unable to make an CT appointment today but will try for tomorrow. I explained to her that [facility] will contact her to schedule CT. She voiced understanding and had no further questions.

## 2015-08-16 NOTE — Progress Notes (Signed)
Called spoke with pt. Reviewed results and recs. Pt voiced understanding and had no further questions.

## 2015-08-16 NOTE — Telephone Encounter (Signed)
Notes Recorded by Osa Craver, CMA on 08/16/2015 at 12:36 PM EDT Called spoke with pt. Informed her of results and recs. STAT CT order was placed. Pt states she is unable to make an CT appointment today but will try for tomorrow. I explained to her that [facility] will contact her to schedule CT. She voiced understanding and had no further questions. ------  Notes Recorded by Melvenia Needles, NP on 08/16/2015 at 10:14 AM EDT Labs shows D D imer is elevated which can be seen with blood clots, it is not a dx  Will need to get CTA Chest PE protocol ASAP (re dyspnea/elevated d dimer)   Spoke with Dawn in [facility] and she states that the pt told her that her mother was highly allergic to the CTA dye and she refuses to proceed with the CTA.  Spoke with TP and made her aware of situation Per verbal order Schedule STAT VQ scan STAT cxr   Pt is aware of previous recommendations and that the CTA course of treatment is the gold standard for blood clots. She is also aware of new orders per TP and agrees to VQ scan and cxr. Pt refuses to have scans done today due to lack of transportation. I explained the importance of completing scans as soon as possible. Nothing further is needed at this time. STAT orders have been placed. Nothing further needed.

## 2015-08-17 ENCOUNTER — Other Ambulatory Visit (HOSPITAL_COMMUNITY): Payer: Self-pay

## 2015-08-17 ENCOUNTER — Encounter (HOSPITAL_COMMUNITY)
Admission: RE | Admit: 2015-08-17 | Discharge: 2015-08-17 | Disposition: A | Discharge status: Home / Self Care | Payer: Medicare Other | Source: Ambulatory Visit | Attending: Adult Health | Admitting: Adult Health

## 2015-08-17 ENCOUNTER — Inpatient Hospital Stay (HOSPITAL_COMMUNITY)
Admission: EM | Admit: 2015-08-17 | Discharge: 2015-08-19 | DRG: 176 | Disposition: A | Discharge status: Home Health | Payer: Medicare Other | Attending: Internal Medicine | Admitting: Internal Medicine

## 2015-08-17 ENCOUNTER — Other Ambulatory Visit: Payer: Self-pay

## 2015-08-17 ENCOUNTER — Encounter (HOSPITAL_COMMUNITY): Payer: Medicare Other

## 2015-08-17 ENCOUNTER — Ambulatory Visit (HOSPITAL_COMMUNITY): Payer: Medicare Other

## 2015-08-17 ENCOUNTER — Telehealth: Payer: Self-pay | Admitting: Internal Medicine

## 2015-08-17 ENCOUNTER — Encounter (HOSPITAL_COMMUNITY): Payer: Self-pay | Admitting: Emergency Medicine

## 2015-08-17 ENCOUNTER — Ambulatory Visit (HOSPITAL_COMMUNITY)
Admission: RE | Admit: 2015-08-17 | Discharge: 2015-08-17 | Disposition: A | Discharge status: Home / Self Care | Payer: Medicare Other | Source: Ambulatory Visit | Attending: Adult Health | Admitting: Adult Health

## 2015-08-17 DIAGNOSIS — I10 Essential (primary) hypertension: Secondary | ICD-10-CM | POA: Diagnosis present

## 2015-08-17 DIAGNOSIS — G8929 Other chronic pain: Secondary | ICD-10-CM | POA: Diagnosis present

## 2015-08-17 DIAGNOSIS — R791 Abnormal coagulation profile: Secondary | ICD-10-CM

## 2015-08-17 DIAGNOSIS — Z7901 Long term (current) use of anticoagulants: Secondary | ICD-10-CM

## 2015-08-17 DIAGNOSIS — R7989 Other specified abnormal findings of blood chemistry: Secondary | ICD-10-CM

## 2015-08-17 DIAGNOSIS — D860 Sarcoidosis of lung: Secondary | ICD-10-CM | POA: Diagnosis present

## 2015-08-17 DIAGNOSIS — Z8673 Personal history of transient ischemic attack (TIA), and cerebral infarction without residual deficits: Secondary | ICD-10-CM | POA: Diagnosis not present

## 2015-08-17 DIAGNOSIS — Z96653 Presence of artificial knee joint, bilateral: Secondary | ICD-10-CM | POA: Diagnosis not present

## 2015-08-17 DIAGNOSIS — R918 Other nonspecific abnormal finding of lung field: Secondary | ICD-10-CM

## 2015-08-17 DIAGNOSIS — Z806 Family history of leukemia: Secondary | ICD-10-CM | POA: Diagnosis not present

## 2015-08-17 DIAGNOSIS — Z91040 Latex allergy status: Secondary | ICD-10-CM

## 2015-08-17 DIAGNOSIS — M1990 Unspecified osteoarthritis, unspecified site: Secondary | ICD-10-CM | POA: Diagnosis not present

## 2015-08-17 DIAGNOSIS — E119 Type 2 diabetes mellitus without complications: Secondary | ICD-10-CM | POA: Diagnosis present

## 2015-08-17 DIAGNOSIS — F329 Major depressive disorder, single episode, unspecified: Secondary | ICD-10-CM | POA: Diagnosis not present

## 2015-08-17 DIAGNOSIS — Z91048 Other nonmedicinal substance allergy status: Secondary | ICD-10-CM

## 2015-08-17 DIAGNOSIS — G4700 Insomnia, unspecified: Secondary | ICD-10-CM | POA: Diagnosis present

## 2015-08-17 DIAGNOSIS — G4733 Obstructive sleep apnea (adult) (pediatric): Secondary | ICD-10-CM | POA: Diagnosis present

## 2015-08-17 DIAGNOSIS — Z888 Allergy status to other drugs, medicaments and biological substances status: Secondary | ICD-10-CM | POA: Diagnosis not present

## 2015-08-17 DIAGNOSIS — I2699 Other pulmonary embolism without acute cor pulmonale: Secondary | ICD-10-CM | POA: Diagnosis not present

## 2015-08-17 DIAGNOSIS — Z7952 Long term (current) use of systemic steroids: Secondary | ICD-10-CM

## 2015-08-17 DIAGNOSIS — Z803 Family history of malignant neoplasm of breast: Secondary | ICD-10-CM | POA: Diagnosis not present

## 2015-08-17 DIAGNOSIS — I700 Atherosclerosis of aorta: Secondary | ICD-10-CM | POA: Insufficient documentation

## 2015-08-17 DIAGNOSIS — Z8249 Family history of ischemic heart disease and other diseases of the circulatory system: Secondary | ICD-10-CM

## 2015-08-17 DIAGNOSIS — Z79899 Other long term (current) drug therapy: Secondary | ICD-10-CM

## 2015-08-17 DIAGNOSIS — Z841 Family history of disorders of kidney and ureter: Secondary | ICD-10-CM | POA: Diagnosis not present

## 2015-08-17 DIAGNOSIS — Z471 Aftercare following joint replacement surgery: Secondary | ICD-10-CM | POA: Diagnosis not present

## 2015-08-17 DIAGNOSIS — M79609 Pain in unspecified limb: Secondary | ICD-10-CM | POA: Diagnosis not present

## 2015-08-17 DIAGNOSIS — F419 Anxiety disorder, unspecified: Secondary | ICD-10-CM | POA: Diagnosis present

## 2015-08-17 DIAGNOSIS — Z7984 Long term (current) use of oral hypoglycemic drugs: Secondary | ICD-10-CM | POA: Diagnosis not present

## 2015-08-17 DIAGNOSIS — J9600 Acute respiratory failure, unspecified whether with hypoxia or hypercapnia: Secondary | ICD-10-CM | POA: Diagnosis not present

## 2015-08-17 DIAGNOSIS — M797 Fibromyalgia: Secondary | ICD-10-CM | POA: Diagnosis not present

## 2015-08-17 DIAGNOSIS — M069 Rheumatoid arthritis, unspecified: Secondary | ICD-10-CM | POA: Diagnosis not present

## 2015-08-17 DIAGNOSIS — R0602 Shortness of breath: Secondary | ICD-10-CM | POA: Diagnosis not present

## 2015-08-17 DIAGNOSIS — D869 Sarcoidosis, unspecified: Secondary | ICD-10-CM | POA: Insufficient documentation

## 2015-08-17 DIAGNOSIS — Z87891 Personal history of nicotine dependence: Secondary | ICD-10-CM

## 2015-08-17 LAB — CBC WITH DIFFERENTIAL/PLATELET
BASOS PCT: 0 %
Basophils Absolute: 0 10*3/uL (ref 0.0–0.1)
Eosinophils Absolute: 0 10*3/uL (ref 0.0–0.7)
Eosinophils Relative: 0 %
HEMATOCRIT: 40.3 % (ref 36.0–46.0)
HEMOGLOBIN: 12.8 g/dL (ref 12.0–15.0)
LYMPHS ABS: 1.9 10*3/uL (ref 0.7–4.0)
LYMPHS PCT: 18 %
MCH: 29.9 pg (ref 26.0–34.0)
MCHC: 31.8 g/dL (ref 30.0–36.0)
MCV: 94.2 fL (ref 78.0–100.0)
MONO ABS: 0.6 10*3/uL (ref 0.1–1.0)
MONOS PCT: 6 %
NEUTROS ABS: 7.9 10*3/uL — AB (ref 1.7–7.7)
NEUTROS PCT: 76 %
Platelets: 214 10*3/uL (ref 150–400)
RBC: 4.28 MIL/uL (ref 3.87–5.11)
RDW: 13.5 % (ref 11.5–15.5)
WBC: 10.4 10*3/uL (ref 4.0–10.5)

## 2015-08-17 LAB — GLUCOSE, CAPILLARY: GLUCOSE-CAPILLARY: 131 mg/dL — AB (ref 65–99)

## 2015-08-17 LAB — BASIC METABOLIC PANEL
Anion gap: 8 (ref 5–15)
BUN: 21 mg/dL — AB (ref 6–20)
CALCIUM: 9.2 mg/dL (ref 8.9–10.3)
CO2: 21 mmol/L — ABNORMAL LOW (ref 22–32)
CREATININE: 1.06 mg/dL — AB (ref 0.44–1.00)
Chloride: 110 mmol/L (ref 101–111)
GFR calc Af Amer: >60 mL/min (ref >60)
GFR, EST NON AFRICAN AMERICAN: 58 mL/min — AB (ref >60)
GLUCOSE: 164 mg/dL — AB (ref 65–99)
Potassium: 4.2 mmol/L (ref 3.5–5.1)
Sodium: 139 mmol/L (ref 135–145)

## 2015-08-17 LAB — PROTIME-INR
INR: 0.93
Prothrombin Time: 12.5 seconds (ref 11.4–15.2)

## 2015-08-17 LAB — I-STAT TROPONIN, ED: Troponin i, poc: 0 ng/mL (ref 0.00–0.08)

## 2015-08-17 LAB — TROPONIN I: Troponin I: <0.03 ng/mL (ref <0.03)

## 2015-08-17 LAB — MRSA PCR SCREENING: MRSA by PCR: NEGATIVE

## 2015-08-17 MED ORDER — ONDANSETRON HCL 4 MG/2ML IJ SOLN: 4.0000 mg | Freq: Four times a day (QID) | INTRAMUSCULAR | Status: DC | PRN

## 2015-08-17 MED ORDER — ALBUTEROL SULFATE HFA 108 (90 BASE) MCG/ACT IN AERS: 2.0000 | INHALATION_SPRAY | Freq: Four times a day (QID) | RESPIRATORY_TRACT | Status: DC | PRN

## 2015-08-17 MED ORDER — ALBUTEROL SULFATE (2.5 MG/3ML) 0.083% IN NEBU: 2.5000 mg | INHALATION_SOLUTION | Freq: Four times a day (QID) | RESPIRATORY_TRACT | Status: DC | PRN

## 2015-08-17 MED ORDER — HEPARIN SODIUM (PORCINE) 5000 UNIT/ML IJ SOLN: 4000.0000 [IU] | Freq: Once | INTRAMUSCULAR | Status: DC

## 2015-08-17 MED ORDER — DOCUSATE SODIUM 100 MG PO CAPS
100.0000 mg | ORAL_CAPSULE | Freq: Two times a day (BID) | ORAL | Status: DC
  Administered 2015-08-17 – 2015-08-19 (×4): 100 mg via ORAL
  Filled 2015-08-17 (×4): qty 1

## 2015-08-17 MED ORDER — METFORMIN HCL 500 MG PO TABS
1000.0000 mg | ORAL_TABLET | Freq: Every day | ORAL | Status: DC
  Administered 2015-08-18 – 2015-08-19 (×2): 1000 mg via ORAL
  Filled 2015-08-17 (×2): qty 2

## 2015-08-17 MED ORDER — NORTRIPTYLINE HCL 25 MG PO CAPS
75.0000 mg | ORAL_CAPSULE | Freq: Every day | ORAL | Status: DC
  Administered 2015-08-17 – 2015-08-18 (×2): 75 mg via ORAL
  Filled 2015-08-17 (×3): qty 3

## 2015-08-17 MED ORDER — SODIUM CHLORIDE 0.9 % IV SOLN: 250.0000 mL | INTRAVENOUS | Status: DC | PRN

## 2015-08-17 MED ORDER — METHOCARBAMOL 500 MG PO TABS
500.0000 mg | ORAL_TABLET | Freq: Two times a day (BID) | ORAL | Status: DC
  Administered 2015-08-17 – 2015-08-19 (×4): 500 mg via ORAL
  Filled 2015-08-17 (×4): qty 1

## 2015-08-17 MED ORDER — GABAPENTIN 300 MG PO CAPS
300.0000 mg | ORAL_CAPSULE | Freq: Two times a day (BID) | ORAL | Status: DC
Start: 2015-08-17 — End: 2015-08-19
  Administered 2015-08-17 – 2015-08-19 (×4): 300 mg via ORAL
  Filled 2015-08-17 (×4): qty 1

## 2015-08-17 MED ORDER — ERGOCALCIFEROL 1.25 MG (50000 UT) PO CAPS: 50000.0000 [IU] | ORAL_CAPSULE | ORAL | Status: DC

## 2015-08-17 MED ORDER — NORTRIPTYLINE HCL 25 MG PO CAPS: 75.0000 mg | ORAL_CAPSULE | Freq: Every day | ORAL | Status: DC

## 2015-08-17 MED ORDER — TOPIRAMATE 25 MG PO TABS: 50.0000 mg | ORAL_TABLET | Freq: Two times a day (BID) | ORAL | Status: DC

## 2015-08-17 MED ORDER — ASPIRIN 300 MG RE SUPP: 300.0000 mg | RECTAL | Status: DC

## 2015-08-17 MED ORDER — HEPARIN (PORCINE) IN NACL 100-0.45 UNIT/ML-% IJ SOLN
1500.0000 [IU]/h | INTRAMUSCULAR | Status: DC
  Administered 2015-08-17: 1500 [IU]/h via INTRAVENOUS
  Filled 2015-08-17: qty 250

## 2015-08-17 MED ORDER — POLYETHYLENE GLYCOL 3350 17 G PO PACK
17.0000 g | PACK | Freq: Every day | ORAL | Status: DC
  Administered 2015-08-18 – 2015-08-19 (×2): 17 g via ORAL
  Filled 2015-08-17 (×2): qty 1

## 2015-08-17 MED ORDER — HEPARIN BOLUS VIA INFUSION
2500.0000 [IU] | Freq: Once | INTRAVENOUS | Status: AC
  Administered 2015-08-17: 2500 [IU] via INTRAVENOUS
  Filled 2015-08-17: qty 2500

## 2015-08-17 MED ORDER — CLOBETASOL PROPIONATE 0.05 % EX SOLN: 1.0000 "application " | Freq: Two times a day (BID) | CUTANEOUS | Status: DC

## 2015-08-17 MED ORDER — TOPIRAMATE 25 MG PO TABS
50.0000 mg | ORAL_TABLET | Freq: Two times a day (BID) | ORAL | Status: DC
  Administered 2015-08-17 – 2015-08-19 (×4): 50 mg via ORAL
  Filled 2015-08-17 (×5): qty 2

## 2015-08-17 MED ORDER — CHLORHEXIDINE GLUCONATE 0.12 % MT SOLN: 5.0000 mL | Freq: Two times a day (BID) | OROMUCOSAL | Status: DC | PRN

## 2015-08-17 MED ORDER — TECHNETIUM TC 99M DIETHYLENETRIAME-PENTAACETIC ACID: 31.7000 | Freq: Once | INTRAVENOUS | Status: DC | PRN

## 2015-08-17 MED ORDER — TECHNETIUM TO 99M ALBUMIN AGGREGATED
3.9000 | Freq: Once | INTRAVENOUS | Status: AC | PRN
  Administered 2015-08-17: 3.9 via INTRAVENOUS

## 2015-08-17 MED ORDER — ESCITALOPRAM OXALATE 20 MG PO TABS
20.0000 mg | ORAL_TABLET | Freq: Every day | ORAL | Status: DC
  Administered 2015-08-18 – 2015-08-19 (×2): 20 mg via ORAL
  Filled 2015-08-17 (×2): qty 1

## 2015-08-17 MED ORDER — PANTOPRAZOLE SODIUM 40 MG PO TBEC
40.0000 mg | DELAYED_RELEASE_TABLET | Freq: Every day | ORAL | Status: DC
  Administered 2015-08-18 – 2015-08-19 (×2): 40 mg via ORAL
  Filled 2015-08-17 (×2): qty 1

## 2015-08-17 MED ORDER — CARVEDILOL 12.5 MG PO TABS
12.5000 mg | ORAL_TABLET | Freq: Two times a day (BID) | ORAL | Status: DC
  Administered 2015-08-18 – 2015-08-19 (×3): 12.5 mg via ORAL
  Filled 2015-08-17 (×3): qty 1

## 2015-08-17 MED ORDER — SUVOREXANT 20 MG PO TABS: 20.0000 mg | ORAL_TABLET | Freq: Every day | ORAL | Status: DC

## 2015-08-17 MED ORDER — SODIUM CHLORIDE 0.9 % IV SOLN
INTRAVENOUS | Status: DC
  Administered 2015-08-18 – 2015-08-19 (×2): via INTRAVENOUS

## 2015-08-17 MED ORDER — VITAMIN D (ERGOCALCIFEROL) 1.25 MG (50000 UNIT) PO CAPS: 50000.0000 [IU] | ORAL_CAPSULE | ORAL | Status: DC

## 2015-08-17 MED ORDER — INSULIN ASPART 100 UNIT/ML ~~LOC~~ SOLN
0.0000 [IU] | Freq: Three times a day (TID) | SUBCUTANEOUS | Status: DC
  Administered 2015-08-19: 1 [IU] via SUBCUTANEOUS

## 2015-08-17 MED ORDER — HEPARIN (PORCINE) IN NACL 100-0.45 UNIT/ML-% IJ SOLN: 15.0000 [IU]/kg/h | Freq: Once | INTRAMUSCULAR | Status: DC

## 2015-08-17 MED ORDER — ASPIRIN 81 MG PO CHEW: 324.0000 mg | CHEWABLE_TABLET | ORAL | Status: DC

## 2015-08-17 MED ORDER — ACETAMINOPHEN 325 MG PO TABS
650.0000 mg | ORAL_TABLET | ORAL | Status: DC | PRN
Start: 2015-08-17 — End: 2015-08-19

## 2015-08-17 MED ORDER — OXYCODONE HCL 5 MG PO TABS
5.0000 mg | ORAL_TABLET | ORAL | Status: DC | PRN
  Administered 2015-08-17: 5 mg via ORAL
  Administered 2015-08-18 – 2015-08-19 (×3): 10 mg via ORAL
  Filled 2015-08-17: qty 1
  Filled 2015-08-17 (×3): qty 2

## 2015-08-17 NOTE — Telephone Encounter (Signed)
TP spoke with patient and advised to go to the ER Pt needed to arrange transportation and will call back Pt called back, her aunt will be there to take her around 4:30pm Per TP: she can go to [facility] ER.  Please call the ER to let them know she is coming  [facility] ER and spoke with charge nurse Jiles Garter, report given and Jiles Garter voiced her understanding.  Pt is aware to go to the [facility] ER.

## 2015-08-17 NOTE — ED Provider Notes (Signed)
[facility] DEPT Provider Note   CSN: [id number] Arrival date & time: 08/17/15  1632  First Provider Contact:  First MD Initiated Contact with Patient 08/17/15 1714        History   Chief Complaint Chief Complaint  Patient presents with  . Shortness of Breath    HPI [name] is a 55 y.o. female.  HPI    She has a history of pulmonary sarcoidosis and is followed with Dr. Elsworth Soho at his primary clinic. She relates increasing shortness of breath back to April. From April through May was on multiple steroid burst and tapers with some improvement. She had to come off the steroids for 2 weeks for total knee arthroplasty was performed in the end of June. She was placed on Xarelto. She went to [facility] for convalescence.  He states she had pain to her left leg majority of her stay there and had to stay 22 days because of pain in her leg versus 13 that she stayed when she had her right TKA last year.  She remembers a day on July 13 where she felt short of breath and she states she was told rocks level was 87% she had a reaction for 2 days. She does not remember if she had chest pain.  She has been out of [place] place for more than a week. She came off of her Xarelto 2 weeks ago. She was again seen in pulmonary clinic because of worsening dyspnea. Had an elevated d-dimer. She had only refused CTA because her mother "died" from a contrast allergy. VQ scan was obtained and shows right upper and left lower lobe large unmatched defects.     Past Medical History:  Diagnosis Date  . Allergic rhinitis   . ALLERGIC RHINITIS 10/26/2009  . Anxiety   . ANXIETY 08/14/2006  . B12 DEFICIENCY 08/25/2007  . Confusion   . Depression    takes Lexapro daily  . Diabetes mellitus without complication ([place])    was on insulin but has been off since Nov 2015 and now only takes Metformin daily  . DYSPNEA 04/28/2009   with exertion  . Esophageal reflux    takes Nexium daily  .  Fibromyalgia   . Headache    last migraine 2-29yrs ago;takes Topamax daily  . History of shingles   . Hypertension    takes Coreg daily  . Insomnia    takes Nortriptyline nightly   . Joint pain   . Joint swelling   . Left knee pain   . Lichen planus   . Long-term memory loss   . Nocturia   . OSA (obstructive sleep apnea)    doesn't use CPAP;sleep study in epic from 2006  . Osteoarthritis   . Osteoarthritis   . Pneumonia    over 30 yrs ago  . Protein calorie malnutrition ([place])   . Rheumatoid arthritis ([place])   . Sarcoidosis ([place])    Dr. Lolita Patella  . Short-term memory loss   . Shortness of breath   . TIA (transient ischemic attack)   . Unsteady gait   . Urinary urgency   . Vitamin D deficiency    is supposed to take Vit D but can't afford it  . VITAMIN D DEFICIENCY 08/25/2007    Patient Active Problem List   Diagnosis Date Noted  . Pulmonary embolism ([place]) 08/17/2015  . Acute pulmonary embolism ([place]) 08/17/2015  . Diabetes ([place]) 02/01/2015  . Postoperative anemia due to acute blood loss 02/01/2015  .  Arthritis of knee, degenerative 11/15/2014  . Calf pain 01/21/2014  . Insomnia 12/23/2013  . Leg ulcer, left ([place]) 09/13/2013  . Abnormal SPEP 06/04/2013  . Memory loss 04/27/2013  . Panic attacks 03/17/2013  . Right knee pain 12/29/2012  . Rash and nonspecific skin eruption 10/27/2012  . Dizziness and giddiness 09/07/2012  . Pulmonary nodules 07/20/2012  . CAD in native artery 05/28/2012  . Seborrheic dermatitis 01/20/2012  . Recurrent knee pain 09/03/2011  . Knee pain, left 08/03/2011  . Leg pain, left 07/17/2011  . Polydipsia 05/09/2011  . Abscess of axilla, left 08/10/2010  . Obesity 03/07/2010  . INSOMNIA, CHRONIC 03/07/2010  . NAUSEA 03/07/2010  . Avulsion fracture of tooth 03/07/2010  . ALLERGIC RHINITIS 10/26/2009  . GRIEF REACTION 08/01/2009  . DYSPNEA 04/28/2009  . MAXILLARY SINUSITIS 10/05/2008  . Essential hypertension 08/27/2007  . B12 deficiency  08/25/2007  . Vitamin D deficiency 08/25/2007  . ELEVATED BP 07/10/2007  . VISUAL CHANGES 07/02/2007  . UTI 02/07/2007  . ABDOMINAL PAIN RIGHT LOWER QUADRANT 02/07/2007  . SINUSITIS, ACUTE 02/02/2007  . Osteoarthritis 02/02/2007  . Sarcoidosis ([place]) 08/14/2006  . Anxiety state 08/14/2006  . Major depressive disorder, recurrent episode ([place]) 08/14/2006  . GERD 08/14/2006    Past Surgical History:  Procedure Laterality Date  . APPENDECTOMY    . arthroscopic knee surgery Right 11-12-04  . AXILLARY ABCESS IRRIGATION AND DEBRIDEMENT  Jul & Aug2012  . CARPAL TUNNEL RELEASE Left 05/23/2014   Procedure: CARPAL TUNNEL RELEASE;  Surgeon: Meredith Pel, MD;  Location: [facility];  Service: Orthopedics;  Laterality: Left;  . cyst removed from top of buttocks  at age 13  . ENDOMETRIAL ABLATION    . TOTAL KNEE ARTHROPLASTY Right 11/15/2014   Procedure: TOTAL RIGHT KNEE ARTHROPLASTY;  Surgeon: Meredith Pel, MD;  Location: [facility];  Service: Orthopedics;  Laterality: Right;  . TOTAL KNEE ARTHROPLASTY Left 07/13/2015   Procedure: LEFT TOTAL KNEE ARTHROPLASTY;  Surgeon: Meredith Pel, MD;  Location: [facility];  Service: Orthopedics;  Laterality: Left;    OB History    No data available       Home Medications    Prior to Admission medications   Medication Sig Start Date End Date Taking? Authorizing Provider  albuterol (PROVENTIL HFA;VENTOLIN HFA) 108 (90 Base) MCG/ACT inhaler Inhale 2 puffs into the lungs every 6 (six) hours as needed for wheezing or shortness of breath. 05/19/15  Yes Rigoberto Noel, MD  carvedilol (COREG) 12.5 MG tablet Take 1 tablet (12.5 mg total) by mouth 2 (two) times daily with a meal. 01/27/15  Yes Evie Lacks Plotnikov, MD  chlorhexidine (PERIDEX) 0.12 % solution Use as directed 5 mLs in the mouth or throat 2 (two) times daily as needed (thrush).  05/15/15  Yes Historical Provider, MD  clobetasol (TEMOVATE) 0.05 % external solution Apply 1 application topically 2 (two) times  daily. 04/13/14  Yes Historical Provider, MD  escitalopram (LEXAPRO) 20 MG tablet TAKE 1 TABLET BY MOUTH DAILY 07/12/15  Yes Evie Lacks Plotnikov, MD  furosemide (LASIX) 20 MG tablet Take 1 tablet (20 mg total) by mouth daily. Patient taking differently: Take 20 mg by mouth daily as needed for fluid or edema.  06/20/15  Yes Evie Lacks Plotnikov, MD  gabapentin (NEURONTIN) 300 MG capsule Take 300 mg by mouth 2 (two) times daily.   Yes Historical Provider, MD  KLOR-CON 10 10 MEQ tablet TAKE 1 TABLET BY MOUTH DAILY 09/02/14  Yes Aleksei V Plotnikov,  MD  metFORMIN (GLUCOPHAGE) 500 MG tablet Take 1,000 mg by mouth daily.   Yes Historical Provider, MD  nortriptyline (PAMELOR) 75 MG capsule Take 1 capsule (75 mg total) by mouth at bedtime. 06/23/15  Yes Evie Lacks Plotnikov, MD  omeprazole (PRILOSEC) 20 MG capsule Take 20 mg by mouth daily.   Yes Historical Provider, MD  oxyCODONE (OXY IR/ROXICODONE) 5 MG immediate release tablet Take 1-2 tablets (5-10 mg total) by mouth every 3 (three) hours as needed for breakthrough pain. 07/14/15  Yes Meredith Pel, MD  predniSONE (DELTASONE) 10 MG tablet Take 2omg by mouth x 2 weeks, 10mg  x 1 week, and 5mg  x 1 week 08/15/15  Yes Tammy S Parrett, NP  Suvorexant (BELSOMRA) 20 MG TABS Take 20 mg by mouth at bedtime. 06/08/15  Yes Evie Lacks Plotnikov, MD  topiramate (TOPAMAX) 50 MG tablet TAKE 1 TABLET BY MOUTH TWICE DAILY 11/14/14  Yes Cassandria Anger, MD  amoxicillin (AMOXIL) 500 MG capsule Take for prior to dental procdures 08/12/15   Historical Provider, MD  Blood Glucose Monitoring Suppl (FREESTYLE LITE) DEVI Use to check blood sugar 2 times per day 07/12/14   Renato Shin, MD  docusate sodium (COLACE) 100 MG capsule Take 100 mg by mouth 2 (two) times daily.     Historical Provider, MD  ergocalciferol (VITAMIN D2) 50000 units capsule Take 1 capsule (50,000 Units total) by mouth every 30 (thirty) days. 06/08/15   Evie Lacks Plotnikov, MD  glucose blood (FREESTYLE LITE) test  strip TEST TWICE DAILY 07/12/14   Renato Shin, MD  hydroquinone 4 % cream APPLY TO THE DARK SPOTS ON THE SKIN QHS FOR 3 MONTHS AS DIRECTED 02/14/15   Historical Provider, MD  Lancets (FREESTYLE) lancets 1 each by Other route 2 (two) times daily. Use as instructed 07/12/14   Renato Shin, MD  methocarbamol (ROBAXIN) 500 MG tablet Take 500 mg by mouth 2 (two) times daily.    Historical Provider, MD  polyethylene glycol (MIRALAX / GLYCOLAX) packet Take 17 g by mouth daily.    Historical Provider, MD  predniSONE (DELTASONE) 10 MG tablet Take 2 tabs daily with food x 5ds, then 1 tab daily with food x 5ds then STOP Patient not taking: Reported on 08/17/2015 08/11/15   Rigoberto Noel, MD    Family History Family History  Problem Relation Age of Onset  . Heart disease Mother   . Kidney disease Mother   . Cancer Father     leukemia  . Hypertension    . Coronary artery disease      female 1st degree relative <60  . Heart failure      congestive  . Coronary artery disease      Female 1st degree relative <50  . Breast cancer      1st degree relative <50 S    Social History Social History  Substance Use Topics  . Smoking status: Former Smoker    Packs/day: 0.50    Years: 10.00    Types: Cigarettes  . Smokeless tobacco: Never Used     Comment: quit smoking in 2004  . Alcohol use No     Allergies   Enalapril maleate; Hydrochlorothiazide; Hydroxychloroquine sulfate; Iodine; Latex; and Nickel   Review of Systems Review of Systems  Constitutional: Negative for appetite change, chills, diaphoresis, fatigue and fever.  HENT: Negative for mouth sores, sore throat and trouble swallowing.   Eyes: Negative for visual disturbance.  Respiratory: Positive for shortness of breath. Negative for  cough, chest tightness and wheezing.   Cardiovascular: Negative for chest pain.  Gastrointestinal: Negative for abdominal distention, abdominal pain, diarrhea, nausea and vomiting.  Endocrine: Negative for  polydipsia, polyphagia and polyuria.  Genitourinary: Negative for dysuria, frequency and hematuria.  Musculoskeletal: Negative for gait problem.       Leg pain. States calf is more painful than knee/incision and has been since being at [facility].  Skin: Negative for color change, pallor and rash.  Neurological: Negative for dizziness, syncope, light-headedness and headaches.  Hematological: Does not bruise/bleed easily.  Psychiatric/Behavioral: Negative for behavioral problems and confusion.     Physical Exam Updated Vital Signs BP 121/73 Comment: Simultaneous filing. User may not have seen previous data.  Pulse 78 Comment: Simultaneous filing. User may not have seen previous data.  Temp 98 F (36.7 C) (Oral)   Resp 15 Comment: Simultaneous filing. User may not have seen previous data.  SpO2 96% Comment: Simultaneous filing. User may not have seen previous data.  Physical Exam  Constitutional: She is oriented to person, place, and time. She appears well-developed and well-nourished. No distress.  HENT:  Head: Normocephalic.  Eyes: Conjunctivae are normal. Pupils are equal, round, and reactive to light. No scleral icterus.  Neck: Normal range of motion. Neck supple. No thyromegaly present.  Cardiovascular: Normal rate and regular rhythm.  Exam reveals no gallop and no friction rub.   No murmur heard. Pulmonary/Chest: Effort normal and breath sounds normal. No respiratory distress. She has no wheezes. She has no rales.  Abdominal: Soft. Bowel sounds are normal. She exhibits no distension. There is no tenderness. There is no rebound.  Musculoskeletal: Normal range of motion.       Legs: Neurological: She is alert and oriented to person, place, and time.  Skin: Skin is warm and dry. No rash noted.  Psychiatric: She has a normal mood and affect. Her behavior is normal.     ED Treatments / Results  Labs (all labs ordered are listed, but only abnormal results are displayed) Labs  Reviewed  BASIC METABOLIC PANEL - Abnormal; Notable for the following:       Result Value   CO2 21 (*)    Glucose, Bld 164 (*)    BUN 21 (*)    Creatinine, Ser 1.06 (*)    GFR calc non Af Amer 58 (*)    All other components within normal limits  CBC WITH DIFFERENTIAL/PLATELET - Abnormal; Notable for the following:    Neutro Abs 7.9 (*)    All other components within normal limits  PROTIME-INR  TROPONIN I  CBC WITH DIFFERENTIAL/PLATELET  HEPARIN LEVEL (UNFRACTIONATED)  CBC  URINALYSIS, ROUTINE W REFLEX MICROSCOPIC (NOT AT [facility])  BASIC METABOLIC PANEL  I-STAT TROPOININ, ED    EKG  EKG Interpretation None       Radiology Dg Chest 2 View  Result Date: 08/17/2015 CLINICAL DATA:  Shortness of breath, progressive. History of sarcoidosis EXAM: CHEST  2 VIEW COMPARISON:  Chest CT February 02, 2013; chest radiograph August 15, 2015 FINDINGS: There is slight scarring in the right upper lobe and right base regions, stable. There is no edema or consolidation. The heart size and pulmonary vascular normal. There is calcification in the aorta. Hilar prominence, more on the right than on the left, is stable and consistent with the known adenopathy secondary to sarcoidosis. There is no apparent bone lesion. IMPRESSION: Persistent hilar fullness consistent with adenopathy from sarcoidosis, stable. Areas of mild scarring on the right. No  edema or consolidation. Stable cardiac silhouette. Aortic atherosclerosis. Electronically Signed   By: Lowella Grip III M.D.   On: 08/17/2015 10:36   Nm Pulmonary Per & Vent  Result Date: 08/17/2015 CLINICAL DATA:  Short of breath since April. History of sarcoidosis. Additional history of knee replacement in June 2017. EXAM: NUCLEAR MEDICINE VENTILATION - PERFUSION LUNG SCAN TECHNIQUE: Ventilation images were obtained in multiple projections using inhaled aerosol Tc-78m DTPA. Perfusion images were obtained in multiple projections after intravenous injection of  Tc-84m MAA. RADIOPHARMACEUTICALS:  Thirty-two mCi Technetium-44m DTPA aerosol inhalation and 3.9 mCi Technetium-53m MAA IV COMPARISON:  Radiograph 08/03/ 2017 FINDINGS: Exam is suboptimal as the patient's arms are at her site and the patient is rotated. Ventilation: There is mild heterogeneity of ventilation. Decreased ventilation in the upper lobes in a regional pattern. Perfusion: There is decreased profusion to the RIGHT upper lobe which is a more severe defect within than the regional decreased perfusion on ventilation. Additionally, there is large profusion defect within the LEFT lower lobe which is unmatched on ventilation. IMPRESSION: Findings consistent with RIGHT upper lobe and LEFT lower lobe PULMONARY EMBOLISM. As this patient reports persistent shortness of breath over months and knee surgery in June 2017 with no increased short of breath over the immediate time period findings could indicate subacute or chronic pulmonary embolism. Recommend clinical correlation. Critical Value results were called by telephone at the time of interpretation on 08/17/2015 at 12:42 pm to Dr. Baird Lyons, who verbally acknowledged these results. Electronically Signed   By: Suzy Bouchard M.D.   On: 08/17/2015 12:43    Procedures Procedures (including critical care time)  Medications Ordered in ED Medications  heparin ADULT infusion 100 units/mL (25000 units/269mL sodium chloride 0.45%) (1,500 Units/hr Intravenous New Bag/Given 08/17/15 1802)  0.9 %  sodium chloride infusion (not administered)  0.9 %  sodium chloride infusion (not administered)  insulin aspart (novoLOG) injection 0-9 Units (not administered)  heparin bolus via infusion 2,500 Units (2,500 Units Intravenous Bolus from Bag 08/17/15 1805)     Initial Impression / Assessment and Plan / ED Course  I have reviewed the triage vital signs and the nursing notes.  Pertinent labs & imaging results that were available during my care of the patient were  reviewed by me and considered in my medical decision making (see chart for details).  Clinical Course    VQ scan reviewed. Shows large right, large left lower main pulmonary trunk emboli. Hemogram patient is intact she is not hypoxemic tachycardic. I discussed the case with [id number].  He recommends heparin bolus and drip and admission. This was started in the emergency room. Dr. Titus Mould is here examining the patient planning admission.  CRITICAL CARE Performed by: Tanna Furry JOSEPH   Total critical care time: 30 minutes  Critical care time was exclusive of separately billable procedures and treating other patients.  Critical care was necessary to treat or prevent imminent or life-threatening deterioration.  Critical care was time spent personally by me on the following activities: development of treatment plan with patient and/or surrogate as well as nursing, discussions with consultants, evaluation of patient's response to treatment, examination of patient, obtaining history from patient or surrogate, ordering and performing treatments and interventions, ordering and review of laboratory studies, ordering and review of radiographic studies, pulse oximetry and re-evaluation of patient's conditionj  Final Clinical Impressions(s) / ED Diagnoses   Final diagnoses:  Other pulmonary embolism without acute cor pulmonale, unspecified chronicity ([place])    New Prescriptions  New Prescriptions   No medications on file     Tanna Furry, MD 08/17/15 [phone]

## 2015-08-17 NOTE — ED Notes (Signed)
Pt being sent by [facility] d/t bilateral pulmonary embolisms.  Hx if sarcoidosis and knee replacement around the end of June.  VQ scan showed PEs.

## 2015-08-17 NOTE — Progress Notes (Signed)
ANTICOAGULATION CONSULT NOTE - Initial Consult  Pharmacy Consult for Heparin Indication: pulmonary embolus  Allergies  Allergen Reactions  . Enalapril Maleate     REACTION: cough  . Hydrochlorothiazide     REACTION: Low potassium  . Hydroxychloroquine Sulfate     REACTION: vision changes  . Iodine     Mother was intolerant--CODED on CT table---pt never tired.  . Latex     Hives  . Nickel Other (See Comments)    Pt has a titanium right knee - nickel causes skin irritations that forms into blisters and sores   Patient Measurements:   Heparin Dosing Weight: 88.3 kg  Vital Signs: Temp: 98 F (36.7 C) (08/03 1657) Temp Source: Oral (08/03 1657) BP: 142/84 (08/03 1657) Pulse Rate: 93 (08/03 1657)  Labs:  Recent Labs  08/15/15 1037  HGB 13.1  HCT 39.9  PLT 218.0  CREATININE 0.90   Estimated Creatinine Clearance: 92.4 mL/min (by C-G formula based on SCr of 0.9 mg/dL).  Medical History: Past Medical History:  Diagnosis Date  . Allergic rhinitis   . ALLERGIC RHINITIS 10/26/2009  . Anxiety   . ANXIETY 08/14/2006  . B12 DEFICIENCY 08/25/2007  . Confusion   . Depression    takes Lexapro daily  . Diabetes mellitus without complication ([place])    was on insulin but has been off since Nov 2015 and now only takes Metformin daily  . DYSPNEA 04/28/2009   with exertion  . Esophageal reflux    takes Nexium daily  . Fibromyalgia   . Headache    last migraine 2-53yrs ago;takes Topamax daily  . History of shingles   . Hypertension    takes Coreg daily  . Insomnia    takes Nortriptyline nightly   . Joint pain   . Joint swelling   . Left knee pain   . Lichen planus   . Long-term memory loss   . Nocturia   . OSA (obstructive sleep apnea)    doesn't use CPAP;sleep study in epic from 2006  . Osteoarthritis   . Osteoarthritis   . Pneumonia    over 30 yrs ago  . Protein calorie malnutrition ([place])   . Rheumatoid arthritis ([place])   . Sarcoidosis ([place])    Dr. Lolita Patella  .  Short-term memory loss   . Shortness of breath   . TIA (transient ischemic attack)   . Unsteady gait   . Urinary urgency   . Vitamin D deficiency    is supposed to take Vit D but can't afford it  . VITAMIN D DEFICIENCY 08/25/2007   Medications:  Scheduled:  Infusions:   Assessment: 22 yoF seen at [facility] today, elevated D-dimer and VQ scan + for PE. Refused CT (noted mother had contrast allergy). Hx of Sarcoidosis and TKA in June, on Xarelto with last dose noted ~ 2 wk ago.  Complaint of chronic SHOB  Goal of Therapy:  Heparin level 0.3-0.7 units/ml Monitor platelets by anticoagulation protocol: Yes   Plan:   Baseline anti-coags ordered  Heparin bolus 2500 units, infusion at 1500 units/hr  Heparin level in 6 hr, daily when at steady state  Daily CBC while on Heparin  Minda Ditto PharmD Pager [phone] 08/17/2015, 5:45 PM

## 2015-08-17 NOTE — ED Notes (Signed)
MD at bedside. 

## 2015-08-17 NOTE — Telephone Encounter (Signed)
Asked to go to the ER for hospital admission

## 2015-08-17 NOTE — ED Notes (Signed)
Hospitalist at bedside 

## 2015-08-17 NOTE — Telephone Encounter (Signed)
Call report from Dr Leonia Reeves Radiology. Pt of Dr Alva/ TP had VQ scan per phone order TP due to elevated D-dimer. Scan read as positive for PE. Acuity and hemodynamic status not known. Per Dr Leonia Reeves, patient has left Radiology and gone home. I will message to Dr Elsworth Soho for his management.

## 2015-08-17 NOTE — H&P (Addendum)
PULMONARY / CRITICAL CARE MEDICINE   Name: [name] MRN: [id number]  DOB: 24-Apr-1960    ADMISSION DATE:  08/17/2015 CONSULTATION DATE:  08/17/2015  REFERRING MD:  Dr. Jeneen Rinks, ER physician  CHIEF COMPLAINT:  Shortness of breath, diagnosed with PE  HISTORY OF PRESENT ILLNESS:   55 year old female with a past medical history significant for sarcoidosis followed by the pulmonary clinic for the same developed dyspnea and was evaluated by pulmonary in clinic on 08/15/2015. She was treated with a prednisone taper and antibiotics and a d-dimer was checked. The d-dimer is noted to be positive. She was referred for a VQ scan that showed a perfusion-ventilation deficit in the RUL and LLL.  Based on these findings she was referred to the ER for admission.  PAST MEDICAL HISTORY :  She  has a past medical history of Allergic rhinitis; ALLERGIC RHINITIS (10/26/2009); Anxiety; ANXIETY (08/14/2006); B12 DEFICIENCY (08/25/2007); Confusion; Depression; Diabetes mellitus without complication ([place]); DYSPNEA (04/28/2009); Esophageal reflux; Fibromyalgia; Headache; History of shingles; Hypertension; Insomnia; Joint pain; Joint swelling; Left knee pain; Lichen planus; Long-term memory loss; Nocturia; OSA (obstructive sleep apnea); Osteoarthritis; Osteoarthritis; Pneumonia; Protein calorie malnutrition ([place]); Rheumatoid arthritis ([place]); Sarcoidosis ([place]); Short-term memory loss; Shortness of breath; TIA (transient ischemic attack); Unsteady gait; Urinary urgency; Vitamin D deficiency; and VITAMIN D DEFICIENCY (08/25/2007).  PAST SURGICAL HISTORY: She  has a past surgical history that includes arthroscopic knee surgery (Right, 11-12-04); Appendectomy; Endometrial ablation; Axillary abcess irrigation and debridement (Jul & Aug2012); cyst removed from top of buttocks (at age 48); Carpal tunnel release (Left, 05/23/2014); Total knee arthroplasty (Right, 11/15/2014); and Total knee arthroplasty (Left,  07/13/2015).  Allergies  Allergen Reactions  . Enalapril Maleate     REACTION: cough  . Hydrochlorothiazide     REACTION: Low potassium  . Hydroxychloroquine Sulfate     REACTION: vision changes  . Iodine     Mother was intolerant--CODED on CT table---pt never tired.  . Latex     Hives  . Nickel Other (See Comments)    Pt has a titanium right knee - nickel causes skin irritations that forms into blisters and sores    Current Facility-Administered Medications on File Prior to Encounter  Medication  . technetium TC 20M diethylenetriame-pentaacetic acid (DTPA) injection [id number] millicurie   Current Outpatient Prescriptions on File Prior to Encounter  Medication Sig  . albuterol (PROVENTIL HFA;VENTOLIN HFA) 108 (90 Base) MCG/ACT inhaler Inhale 2 puffs into the lungs every 6 (six) hours as needed for wheezing or shortness of breath.  . carvedilol (COREG) 12.5 MG tablet Take 1 tablet (12.5 mg total) by mouth 2 (two) times daily with a meal.  . chlorhexidine (PERIDEX) 0.12 % solution Use as directed 5 mLs in the mouth or throat 2 (two) times daily as needed (thrush).   . clobetasol (TEMOVATE) 0.05 % external solution Apply 1 application topically 2 (two) times daily.  [name] escitalopram (LEXAPRO) 20 MG tablet TAKE 1 TABLET BY MOUTH DAILY  . furosemide (LASIX) 20 MG tablet Take 1 tablet (20 mg total) by mouth daily. (Patient taking differently: Take 20 mg by mouth daily as needed for fluid or edema. )  . gabapentin (NEURONTIN) 300 MG capsule Take 300 mg by mouth 2 (two) times daily.  [name] KLOR-CON 10 10 MEQ tablet TAKE 1 TABLET BY MOUTH DAILY  . metFORMIN (GLUCOPHAGE) 500 MG tablet Take 1,000 mg by mouth daily.  . nortriptyline (PAMELOR) 75 MG capsule Take 1 capsule (75 mg total) by mouth at bedtime.  [name]  omeprazole (PRILOSEC) 20 MG capsule Take 20 mg by mouth daily.  [name] oxyCODONE (OXY IR/ROXICODONE) 5 MG immediate release tablet Take 1-2 tablets (5-10 mg total) by mouth every 3 (three) hours as needed  for breakthrough pain.  . predniSONE (DELTASONE) 10 MG tablet Take 2omg by mouth x 2 weeks, 10mg  x 1 week, and 5mg  x 1 week  . Suvorexant (BELSOMRA) 20 MG TABS Take 20 mg by mouth at bedtime.  . topiramate (TOPAMAX) 50 MG tablet TAKE 1 TABLET BY MOUTH TWICE DAILY  . amoxicillin (AMOXIL) 500 MG capsule Take for prior to dental procdures  . Blood Glucose Monitoring Suppl (FREESTYLE LITE) DEVI Use to check blood sugar 2 times per day  . docusate sodium (COLACE) 100 MG capsule Take 100 mg by mouth 2 (two) times daily.   . ergocalciferol (VITAMIN D2) 50000 units capsule Take 1 capsule (50,000 Units total) by mouth every 30 (thirty) days.  [name] glucose blood (FREESTYLE LITE) test strip TEST TWICE DAILY  . hydroquinone 4 % cream APPLY TO THE DARK SPOTS ON THE SKIN QHS FOR 3 MONTHS AS DIRECTED  . Lancets (FREESTYLE) lancets 1 each by Other route 2 (two) times daily. Use as instructed  . methocarbamol (ROBAXIN) 500 MG tablet Take 500 mg by mouth 2 (two) times daily.  . polyethylene glycol (MIRALAX / GLYCOLAX) packet Take 17 g by mouth daily.  . predniSONE (DELTASONE) 10 MG tablet Take 2 tabs daily with food x 5ds, then 1 tab daily with food x 5ds then STOP (Patient not taking: Reported on 08/17/2015)  . [DISCONTINUED] potassium chloride (K-DUR,KLOR-CON) 10 MEQ tablet TAKE 1 TABLET BY MOUTH DAILY    FAMILY HISTORY:  Her @FAMSTP (<SUBSCRIPT> error)@  SOCIAL HISTORY: She  reports that she has quit smoking. Her smoking use included Cigarettes. She has a 5.00 pack-year smoking history. She has never used smokeless tobacco. She reports that she does not drink alcohol or use drugs.  REVIEW OF SYSTEMS:   Gen: Denies fever, chills, weight change, fatigue, night sweats HEENT: Denies blurred vision, double vision, hearing loss, tinnitus, sinus congestion, rhinorrhea, sore throat, neck stiffness, dysphagia PULM: notes dyspnea  CV: Denies chest pain, edema, orthopnea, paroxysmal nocturnal dyspnea, palpitations GI:  Denies abdominal pain, nausea, vomiting, diarrhea, hematochezia, melena, constipation, change in bowel habits GU: Denies dysuria, hematuria, polyuria, oliguria, urethral discharge Endocrine: Denies hot or cold intolerance, polyuria, polyphagia or appetite change Derm: Denies rash, dry skin, scaling or peeling skin change Heme: Denies easy bruising, bleeding, bleeding gums Neuro: Denies headache, numbness, weakness, slurred speech, loss of memory or consciousness   SUBJECTIVE:  As above  VITAL SIGNS: BP 142/84 (BP Location: Right Arm)   Pulse 93   Temp 98 F (36.7 C) (Oral)   Resp 18   SpO2 95%   HEMODYNAMICS:    VENTILATOR SETTINGS:    INTAKE / OUTPUT: No intake/output data recorded.  PHYSICAL EXAMINATION: General: awake,No distress Neuro:  A ox3anxious "please dont let me die" HEENT:  jvdwnl Cardiovascular:  s1 s2 RR NOT tachy,slight distant,noheeve Lungs:  Slight distant CTA Abdomen:  Soft,obese,nT,no r/g Musculoskeletal:  Mild edema,norash,no tenderness Skin:  No rash  LABS:  BMET  Recent Labs Lab 08/15/15 1037  NA 140  K 3.5  CL 105  CO2 26  BUN 18  CREATININE 0.90  GLUCOSE 153*    Electrolytes  Recent Labs Lab 08/15/15 1037  CALCIUM 9.6    CBC  Recent Labs Lab 08/15/15 1037  WBC 12.2*  HGB 13.1  HCT  39.9  PLT 218.0    Coag's No results for input(s): APTT, INR in the last 168 hours.  Sepsis Markers No results for input(s): LATICACIDVEN, PROCALCITON, O2SATVEN in the last 168 hours.  ABG No results for input(s): PHART, PCO2ART, PO2ART in the last 168 hours.  Liver Enzymes No results for input(s): AST, ALT, ALKPHOS, BILITOT, ALBUMIN in the last 168 hours.  Cardiac Enzymes No results for input(s): TROPONINI, PROBNP in the last 168 hours.  Glucose No results for input(s): GLUCAP in the last 168 hours.  Imaging Dg Chest 2 View  Result Date: 08/17/2015 CLINICAL DATA:  Shortness of breath, progressive. History of sarcoidosis  EXAM: CHEST  2 VIEW COMPARISON:  Chest CT February 02, 2013; chest radiograph August 15, 2015 FINDINGS: There is slight scarring in the right upper lobe and right base regions, stable. There is no edema or consolidation. The heart size and pulmonary vascular normal. There is calcification in the aorta. Hilar prominence, more on the right than on the left, is stable and consistent with the known adenopathy secondary to sarcoidosis. There is no apparent bone lesion. IMPRESSION: Persistent hilar fullness consistent with adenopathy from sarcoidosis, stable. Areas of mild scarring on the right. No edema or consolidation. Stable cardiac silhouette. Aortic atherosclerosis. Electronically Signed   By: Lowella Grip III M.D.   On: 08/17/2015 10:36   Nm Pulmonary Per & Vent  Result Date: 08/17/2015 CLINICAL DATA:  Short of breath since April. History of sarcoidosis. Additional history of knee replacement in June 2017. EXAM: NUCLEAR MEDICINE VENTILATION - PERFUSION LUNG SCAN TECHNIQUE: Ventilation images were obtained in multiple projections using inhaled aerosol Tc-88m DTPA. Perfusion images were obtained in multiple projections after intravenous injection of Tc-31m MAA. RADIOPHARMACEUTICALS:  Thirty-two mCi Technetium-2m DTPA aerosol inhalation and 3.9 mCi Technetium-65m MAA IV COMPARISON:  Radiograph 08/03/ 2017 FINDINGS: Exam is suboptimal as the patient's arms are at her site and the patient is rotated. Ventilation: There is mild heterogeneity of ventilation. Decreased ventilation in the upper lobes in a regional pattern. Perfusion: There is decreased profusion to the RIGHT upper lobe which is a more severe defect within than the regional decreased perfusion on ventilation. Additionally, there is large profusion defect within the LEFT lower lobe which is unmatched on ventilation. IMPRESSION: Findings consistent with RIGHT upper lobe and LEFT lower lobe PULMONARY EMBOLISM. As this patient reports persistent shortness  of breath over months and knee surgery in June 2017 with no increased short of breath over the immediate time period findings could indicate subacute or chronic pulmonary embolism. Recommend clinical correlation. Critical Value results were called by telephone at the time of interpretation on 08/17/2015 at 12:42 pm to Dr. Baird Lyons, who verbally acknowledged these results. Electronically Signed   By: Suzy Bouchard M.D.   On: 08/17/2015 12:43     STUDIES:  8/3 2017 V/Q Scan LLL and RUL acute PE  CULTURES:   ANTIBIOTICS:   SIGNIFICANT EVENTS:   LINES/TUBES:   DISCUSSION: 55 year old female with a past medical history significant for sarcoidosis came to the [facility] emergency department on August 13,017 after she was diagnosed with an acute pulmonary emboli. She is currently hemodynamically stable and just needs anticoagulation. Because of the pulmonary embolism is most likely her recent total knee replacement in June 2017.  ASSESSMENT / PLAN:  PULMONARY A: Provoked pulmonary embolism Pulmonary sarcoidosis > doubt flare P:   Stop steroids and antibiotics Heparin per pharmacy Check echocardiogram Check lower extremity Doppler Plan to transition to Xarelto  or other direct oral anticoagulation agent if patient willing on 08/18/2015 Would plan for 3-6 months of anticoagulation  CARDIOVASCULAR A:  Hemodynamics PE P:  Monitor blood pressure Tele tropx1 Echo for RV and pa,especially in setting sarcoid  RENAL A:   No acute issues P:   Monitor BMET and UOP Replace electrolytes as needed Likely for xaralto,crt trend  GASTROINTESTINAL A:   No acute issues P:   Regular diet(dm diet)  HEMATOLOGIC A:   Provoked pulmonary embolism after left total knee replacement subacute P:  Happen per pharmacy overnight Transition to direct oral anticoagulant on 08/18/2015  INFECTIOUS A:   No acute issues P:   Monitor for infection  ENDOCRINE A:   Diabetes mellitus  type 2 P:   Sliding scale insulin  NEUROLOGIC A:   No acute issues P:    FAMILY  - Updates: none bedside,I updated pt  - Inter-disciplinary family meet or Palliative Care meeting due by:      Lavon Paganini. Titus Mould, MD, [place] Pgr: [facility]

## 2015-08-17 NOTE — ED Triage Notes (Signed)
Pt sent from [facility] for PE. Reports SOB but denies chest pain.

## 2015-08-18 ENCOUNTER — Inpatient Hospital Stay (HOSPITAL_COMMUNITY): Payer: Medicare Other

## 2015-08-18 ENCOUNTER — Other Ambulatory Visit: Payer: Self-pay

## 2015-08-18 DIAGNOSIS — J9600 Acute respiratory failure, unspecified whether with hypoxia or hypercapnia: Secondary | ICD-10-CM

## 2015-08-18 DIAGNOSIS — M79609 Pain in unspecified limb: Secondary | ICD-10-CM

## 2015-08-18 DIAGNOSIS — I2699 Other pulmonary embolism without acute cor pulmonale: Secondary | ICD-10-CM

## 2015-08-18 LAB — URINALYSIS, ROUTINE W REFLEX MICROSCOPIC
BILIRUBIN URINE: NEGATIVE
Glucose, UA: NEGATIVE mg/dL
HGB URINE DIPSTICK: NEGATIVE
Ketones, ur: NEGATIVE mg/dL
Leukocytes, UA: NEGATIVE
NITRITE: NEGATIVE
PH: 5.5 (ref 5.0–8.0)
Protein, ur: NEGATIVE mg/dL
SPECIFIC GRAVITY, URINE: 1.02 (ref 1.005–1.030)

## 2015-08-18 LAB — GLUCOSE, CAPILLARY
GLUCOSE-CAPILLARY: 101 mg/dL — AB (ref 65–99)
GLUCOSE-CAPILLARY: 112 mg/dL — AB (ref 65–99)
Glucose-Capillary: 120 mg/dL — ABNORMAL HIGH (ref 65–99)
Glucose-Capillary: 110 mg/dL — ABNORMAL HIGH (ref 65–99)

## 2015-08-18 LAB — HEPARIN LEVEL (UNFRACTIONATED)
HEPARIN UNFRACTIONATED: 0.94 [IU]/mL — AB (ref 0.30–0.70)
Heparin Unfractionated: 0.97 IU/mL — ABNORMAL HIGH (ref 0.30–0.70)

## 2015-08-18 LAB — CBC
HEMATOCRIT: 36.8 % (ref 36.0–46.0)
HEMOGLOBIN: 11.7 g/dL — AB (ref 12.0–15.0)
MCH: 30.1 pg (ref 26.0–34.0)
MCHC: 31.8 g/dL (ref 30.0–36.0)
MCV: 94.6 fL (ref 78.0–100.0)
Platelets: 195 10*3/uL (ref 150–400)
RBC: 3.89 MIL/uL (ref 3.87–5.11)
RDW: 13.6 % (ref 11.5–15.5)
WBC: 8.8 10*3/uL (ref 4.0–10.5)

## 2015-08-18 LAB — ECHOCARDIOGRAM COMPLETE
HEIGHTINCHES: 67 in
Weight: 3996.5 oz

## 2015-08-18 LAB — BASIC METABOLIC PANEL
Anion gap: 6 (ref 5–15)
BUN: 19 mg/dL (ref 6–20)
CHLORIDE: 109 mmol/L (ref 101–111)
CO2: 27 mmol/L (ref 22–32)
CREATININE: 1.04 mg/dL — AB (ref 0.44–1.00)
Calcium: 8.9 mg/dL (ref 8.9–10.3)
GFR calc non Af Amer: 59 mL/min — ABNORMAL LOW (ref >60)
Glucose, Bld: 105 mg/dL — ABNORMAL HIGH (ref 65–99)
Potassium: 3.5 mmol/L (ref 3.5–5.1)
Sodium: 142 mmol/L (ref 135–145)

## 2015-08-18 MED ORDER — RIVAROXABAN 20 MG PO TABS: 20.0000 mg | ORAL_TABLET | Freq: Every day | ORAL | Status: DC

## 2015-08-18 MED ORDER — RIVAROXABAN 15 MG PO TABS: 15.0000 mg | ORAL_TABLET | Freq: Two times a day (BID) | ORAL | Status: DC

## 2015-08-18 MED ORDER — RIVAROXABAN (XARELTO) EDUCATION KIT FOR DVT/PE PATIENTS
PACK | Freq: Once | Status: DC
  Filled 2015-08-18: qty 1

## 2015-08-18 MED ORDER — DICLOFENAC SODIUM 1 % TD GEL
2.0000 g | Freq: Four times a day (QID) | TRANSDERMAL | Status: DC
  Administered 2015-08-18 (×2): 2 g via TOPICAL
  Filled 2015-08-18: qty 100

## 2015-08-18 MED ORDER — CLOBETASOL PROPIONATE 0.05 % EX OINT
TOPICAL_OINTMENT | Freq: Two times a day (BID) | CUTANEOUS | Status: DC
  Administered 2015-08-18 (×2): via TOPICAL
  Filled 2015-08-18: qty 15

## 2015-08-18 MED ORDER — RIVAROXABAN 15 MG PO TABS
15.0000 mg | ORAL_TABLET | Freq: Once | ORAL | Status: AC
  Administered 2015-08-18: 15 mg via ORAL
  Filled 2015-08-18: qty 1

## 2015-08-18 MED ORDER — RIVAROXABAN 15 MG PO TABS
15.0000 mg | ORAL_TABLET | Freq: Two times a day (BID) | ORAL | Status: DC
  Administered 2015-08-18 – 2015-08-19 (×2): 15 mg via ORAL
  Filled 2015-08-18 (×2): qty 1

## 2015-08-18 MED ORDER — CETYLPYRIDINIUM CHLORIDE 0.05 % MT LIQD
7.0000 mL | Freq: Two times a day (BID) | OROMUCOSAL | Status: DC
  Administered 2015-08-19: 7 mL via OROMUCOSAL

## 2015-08-18 MED ORDER — HEPARIN (PORCINE) IN NACL 100-0.45 UNIT/ML-% IJ SOLN
1300.0000 [IU]/h | INTRAMUSCULAR | Status: DC
  Filled 2015-08-18: qty 250

## 2015-08-18 NOTE — Progress Notes (Signed)
Pt is active with [facility]/HHPT will benefit with [facility]. MD Need face to face and HH orders for HHPT/RN.

## 2015-08-18 NOTE — Progress Notes (Signed)
*  Preliminary Results* Bilateral lower extremity venous duplex completed. Bilateral lower extremities are negative for deep vein thrombosis. There is no evidence of Baker's cyst bilaterally.  Incidental finding: there is evidence of a heterogenous area of the left groin, suggestive of a prominent inguinal lymph node.  08/18/2015 3:05 PM Maudry Mayhew, B.S., RVT, RDCS, RDMS

## 2015-08-18 NOTE — Progress Notes (Signed)
Pt.had a brief episode of oxygen desaturation at 0519. Oxygen saturation on room air was 87%. She also had a brief c/o non-radiating chest pain 7/10. O2 [place] placed on 2 L. EKG done. Pt. denied chest pain after EKG done. On call E-link MD was called and notified.

## 2015-08-18 NOTE — Progress Notes (Signed)
  Echocardiogram 2D Echocardiogram has been performed.  Darlina Sicilian M 08/18/2015, 9:52 AM

## 2015-08-18 NOTE — Discharge Summary (Signed)
Physician Discharge Summary  Patient ID: ORVILLA TRUETT MRN: [id number] DOB/AGE: [birth year] 55 y.o.  Admit date: 08/17/2015 Discharge date: 08/19/2015    Discharge Diagnoses:  Provoked Pulmonary Embolism  Pulmonary Sarcoidosis  OSA HTN Diabetes Mellitus Type 2 Chronic Pain  Fibromyalgia  Depression / Anxiety  Insomnia                                                                       DISCHARGE PLAN BY DIAGNOSIS     Provoked Pulmonary Embolism  Pulmonary Sarcoidosis  OSA - not on CPAP  Discharge Plan:  Xarelto 15 mg BID x 20 days, then decrease to 20 mg QD Plan for 6 months anticoagulation  Follow up with Dr. Elsworth Soho as scheduled 09/12/15 at 1:30 pm  Resume home PRN albuterol  No further steroids or abx .  D/c Voltaren while on Xarelto   HTN  Discharge Plan:  Continue home Coreg, lasix / potassium Follow up with PCP for BP review as needed   Diabetes Mellitus Type 2  Discharge Plan:  Continue home glucophage  Follow up with PCP regarding glucose control   Chronic Pain  Fibromyalgia  Depression / Anxiety  Insomnia   ?Prominent inguinal lymph node measuring 3.1cm -follow up with PCP   Discharge Plan:  Continue home lexapro, gabapentin, nortriptyline, oxycodone, suvorexant, topamax, methocarbamol No new Rx given at time of discharge for above medications.                  DISCHARGE SUMMARY   [name] is a 55 y.o. y/o female with a PMH of TIA, HTN, Migraines, fibromyalgia, depression, anxiety, recent left total knee replacement (June 2017), OSA (not on CPAP) and pulmonary sarcoidosis followed by Dr. Elsworth Soho who was admitted from the pulmonary office on 08/15/15.    She was treated with a prednisone taper and antibiotics and a d-dimer was checked. The d-dimer is noted to be positive. She was referred for a VQ scan that showed a perfusion-ventilation deficit in the RUL and LLL.  Based on these findings she was referred to the ER for admission for  PE.  The patient was admitted for further evaluation.  She was treated with IV heparin per pharmacy.  ECHO assessed and demonstrated w/ nml EF , no evidence of right heart strain .  Lower extremity dopplers negative for DVT.  On 8/4, she was transitioned to Xarelto for PE.  The patient was medically cleared 8/5  for discharge with plans as above.               SIGNIFICANT DIAGNOSTIC STUDIES CT Chest 01/2013 >> stable changes of sarcoid within the lungs and mediastinum, bilateral upper lobe nodular and mass like opacities unchanged  PFT 05/2014 >> FVC 65%, ratio 69, FEV1 58%, DLCO 62%  VQ Scan 08/17/15 >> LLL & RUL acute PE   ECHO 08/18/15 >> mild LVH , EF 55-60%, Gr 1 DD .   LE Venous Doppler 08/18/15 >> neg DVT , no bakers cyst, ?prominent inguinal lymph node measuring 3.1cm    Discharge Exam: General: obese female in NAD Neuro: AAOx4, speech clear, MAE  CV: s1s2 rrr, no m/r/g PULM: even/non-labored, lungs bilaterally distant but clear  GI: soft / non-tender,  BSx4 active  Extremities: warm/dry, mild LE edema  Skin:  No rashes or lesions   Vitals:   08/18/15 1306 08/18/15 1533 08/18/15 2028 08/19/15 0430  BP:  119/72 130/71 125/80  Pulse: 71 73 79 84  Resp: 20 19 18 18   Temp:  98.7 F (37.1 C) 98 F (36.7 C) 98.3 F (36.8 C)  TempSrc:  Oral Oral Oral  SpO2: 100% 98% 97% 97%  Weight:    119.2 kg (262 lb 12.6 oz)  Height:         Discharge Labs  BMET  Recent Labs Lab 08/15/15 1037 08/17/15 1716 08/18/15 0354  NA 140 139 142  K 3.5 4.2 3.5  CL 105 110 109  CO2 26 21* 27  GLUCOSE 153* 164* 105*  BUN 18 21* 19  CREATININE 0.90 1.06* 1.04*  CALCIUM 9.6 9.2 8.9    CBC  Recent Labs Lab 08/17/15 1814 08/18/15 0354 08/19/15 0512  HGB 12.8 11.7* 12.4  HCT 40.3 36.8 38.7  WBC 10.4 8.8 7.0  PLT 214 195 192    Anti-Coagulation  Recent Labs Lab 08/17/15 1716  INR 0.93    Discharge Instructions    Call MD for:  difficulty breathing, headache or visual  disturbances    Complete by:  As directed   Call MD for:  extreme fatigue    Complete by:  As directed   Call MD for:  hives    Complete by:  As directed   Call MD for:  persistant dizziness or light-headedness    Complete by:  As directed   Call MD for:  persistant nausea and vomiting    Complete by:  As directed   Call MD for:  severe uncontrolled pain    Complete by:  As directed   Call MD for:  temperature >100.4    Complete by:  As directed   Diet - low sodium heart healthy    Complete by:  As directed   Discharge instructions    Complete by:  As directed   Keep follow up with Dr. Sheilah Pigeon will have 2 prescriptions for Xarelto at pharmacy. First one is Xarelto 39m Twice daily  For 20 days then on 8/25 you will begin Xarelto 225mdaily  If you have any questions please call our office [phone]  Increase activity slowly    Complete by:  As directed       Follow-up Information    ALRigoberto Noel MD Follow up on 09/12/2015.   Specialty:  Pulmonary Disease Why:  Appt at 1:30  Contact information: [id number] Follow up in 1 week(s).   Specialty:  Internal Medicine Contact information: [phone]            Medication List    STOP taking these medications   diclofenac sodium 1 % Gel Commonly known as:  VOLTAREN   predniSONE 10 MG tablet Commonly known as:  DELTASONE     TAKE these medications   albuterol 108 (90 Base) MCG/ACT inhaler Commonly known as:  PROVENTIL HFA;VENTOLIN HFA Inhale 2 puffs into the lungs every 6 (six) hours as needed for wheezing or shortness of breath.   amoxicillin 500 MG capsule Commonly known as:  AMOXIL Take for prior to dental procdures   carvedilol 12.5 MG tablet Commonly known as:  COREG Take 1 tablet (12.5  mg total) by mouth 2 (two) times daily with a meal.   chlorhexidine 0.12 % solution Commonly known as:   PERIDEX Use as directed 5 mLs in the mouth or throat 2 (two) times daily as needed (thrush).   clobetasol 0.05 % external solution Commonly known as:  TEMOVATE Apply 1 application topically 2 (two) times daily.   docusate sodium 100 MG capsule Commonly known as:  COLACE Take 100 mg by mouth 2 (two) times daily.   ergocalciferol 50000 units capsule Commonly known as:  VITAMIN D2 Take 1 capsule (50,000 Units total) by mouth every 30 (thirty) days.   escitalopram 20 MG tablet Commonly known as:  LEXAPRO TAKE 1 TABLET BY MOUTH DAILY   freestyle lancets 1 each by Other route 2 (two) times daily. Use as instructed   FREESTYLE LITE Devi Use to check blood sugar 2 times per day   furosemide 20 MG tablet Commonly known as:  LASIX Take 1 tablet (20 mg total) by mouth daily. What changed:  when to take this  reasons to take this   gabapentin 300 MG capsule Commonly known as:  NEURONTIN Take 300 mg by mouth 2 (two) times daily.   glucose blood test strip Commonly known as:  FREESTYLE LITE TEST TWICE DAILY   hydroquinone 4 % cream APPLY TO THE DARK SPOTS ON THE SKIN QHS FOR 3 MONTHS AS DIRECTED   KLOR-CON 10 10 MEQ tablet Generic drug:  potassium chloride TAKE 1 TABLET BY MOUTH DAILY   metFORMIN 500 MG tablet Commonly known as:  GLUCOPHAGE Take 1,000 mg by mouth daily.   methocarbamol 500 MG tablet Commonly known as:  ROBAXIN Take 500 mg by mouth 2 (two) times daily.   nortriptyline 75 MG capsule Commonly known as:  PAMELOR Take 1 capsule (75 mg total) by mouth at bedtime.   omeprazole 20 MG capsule Commonly known as:  PRILOSEC Take 20 mg by mouth daily.   oxyCODONE 5 MG immediate release tablet Commonly known as:  Oxy IR/ROXICODONE Take 1-2 tablets (5-10 mg total) by mouth every 3 (three) hours as needed for breakthrough pain.   polyethylene glycol packet Commonly known as:  MIRALAX / GLYCOLAX Take 17 g by mouth daily.   Rivaroxaban 15 MG Tabs  tablet Commonly known as:  XARELTO Take 1 tablet (15 mg total) by mouth 2 (two) times daily with a meal.   rivaroxaban 20 MG Tabs tablet Commonly known as:  XARELTO Take 1 tablet (20 mg total) by mouth daily with supper. Start taking on:  09/08/2015   Suvorexant 20 MG Tabs Commonly known as:  BELSOMRA Take 20 mg by mouth at bedtime.   topiramate 50 MG tablet Commonly known as:  TOPAMAX TAKE 1 TABLET BY MOUTH TWICE DAILY         Disposition:  Home.  No new home health needs identified prior to discharge.   Discharged Condition: RAYLINN KOSAR has met maximum benefit of inpatient care and is medically stable and cleared for discharge.  Patient is pending follow up as above.      Time spent on disposition:  Greater than 35 minutes.   Signed: Rexene Edison NP-C  [facility]  [phone]   08/19/2015    Attending: I independently examined patient and discussed discharge plans. I have reviewed and agree with summary and plans as above.  CD Annamaria Boots, MD PCCM

## 2015-08-18 NOTE — Progress Notes (Signed)
ANTICOAGULATION CONSULT NOTE - f/u Consult  Pharmacy Consult for Heparin Indication: pulmonary embolus  Allergies  Allergen Reactions  . Enalapril Maleate     REACTION: cough  . Hydrochlorothiazide     REACTION: Low potassium  . Hydroxychloroquine Sulfate     REACTION: vision changes  . Iodine     Mother was intolerant--CODED on CT table---pt never tired.  . Latex     Hives  . Nickel Other (See Comments)    Pt has a titanium right knee - nickel causes skin irritations that forms into blisters and sores   Patient Measurements: Height: 5\' 7"  (170.2 cm) Weight: 249 lb 12.5 oz (113.3 kg) IBW/kg (Calculated) : 61.6 Heparin Dosing Weight: 88.3 kg  Vital Signs: Temp: 97.8 F (36.6 C) (08/04 0000) Temp Source: Axillary (08/04 0000) BP: 121/73 (08/03 1900) Pulse Rate: 78 (08/03 1900)  Labs:  Recent Labs  08/15/15 1037 08/17/15 1716 08/17/15 1814 08/18/15 0004  HGB 13.1  --  12.8  --   HCT 39.9  --  40.3  --   PLT 218.0  --  214  --   LABPROT  --  12.5  --   --   INR  --  0.93  --   --   HEPARINUNFRC  --   --   --  0.97*  CREATININE 0.90 1.06*  --   --   TROPONINI  --   --  <0.03  --    Estimated Creatinine Clearance: 77.9 mL/min (by C-G formula based on SCr of 1.06 mg/dL).  Medical History: Past Medical History:  Diagnosis Date  . Allergic rhinitis   . ALLERGIC RHINITIS 10/26/2009  . Anxiety   . ANXIETY 08/14/2006  . B12 DEFICIENCY 08/25/2007  . Confusion   . Depression    takes Lexapro daily  . Diabetes mellitus without complication ([place])    was on insulin but has been off since Nov 2015 and now only takes Metformin daily  . DYSPNEA 04/28/2009   with exertion  . Esophageal reflux    takes Nexium daily  . Fibromyalgia   . Headache    last migraine 2-59yrs ago;takes Topamax daily  . History of shingles   . Hypertension    takes Coreg daily  . Insomnia    takes Nortriptyline nightly   . Joint pain   . Joint swelling   . Left knee pain   . Lichen planus    . Long-term memory loss   . Nocturia   . OSA (obstructive sleep apnea)    doesn't use CPAP;sleep study in epic from 2006  . Osteoarthritis   . Osteoarthritis   . Pneumonia    over 30 yrs ago  . Protein calorie malnutrition ([place])   . Rheumatoid arthritis ([place])   . Sarcoidosis ([place])    Dr. Lolita Patella  . Short-term memory loss   . Shortness of breath   . TIA (transient ischemic attack)   . Unsteady gait   . Urinary urgency   . Vitamin D deficiency    is supposed to take Vit D but can't afford it  . VITAMIN D DEFICIENCY 08/25/2007   Medications:  Scheduled:  Infusions:   Assessment: 16 yoF seen at [facility], elevated D-dimer and VQ scan + for PE. Refused CT (noted mother had contrast allergy). Hx of Sarcoidosis and TKA in June, on Xarelto with last dose noted ~ 2 wk ago.  Complaint of chronic SHOB  Today, 8/4 0004 HL=0.97 (supratherapeutic) , no infusion  or bleeding problems per RN.  Goal of Therapy:  Heparin level 0.3-0.7 units/ml Monitor platelets by anticoagulation protocol: Yes   Plan:   Decrease heparin drip to 1300 units/hr  Heparin level in 6 hr, daily when at steady state  Daily CBC while on Heparin   Dorrene German 08/18/2015, 12:42 AM

## 2015-08-18 NOTE — Progress Notes (Signed)
PULMONARY / CRITICAL CARE MEDICINE   Name: [name] MRN: [id number]  DOB: 01/19/1960    ADMISSION DATE:  08/17/2015 CONSULTATION DATE:  08/17/2015  REFERRING MD:  Dr. Jeneen Rinks, ER physician  CHIEF COMPLAINT:  Shortness of breath, diagnosed with PE  HISTORY OF PRESENT ILLNESS:   55 year old female with a past medical history significant for sarcoidosis followed by the pulmonary clinic for the same developed dyspnea and was evaluated by pulmonary in clinic on 08/15/2015. She was treated with a prednisone taper and antibiotics and a d-dimer was checked. The d-dimer is noted to be positive. She was referred for a VQ scan that showed a perfusion-ventilation deficit in the RUL and LLL.  Based on these findings she was referred to the ER for admission.    SUBJECTIVE:  'Im not sick'  No dyspnea No CP afebrile  VITAL SIGNS: BP (!) 126/57   Pulse 70   Temp 98.1 F (36.7 C) (Axillary) Comment (Src): patient states ONLY axillary temperatures  Resp 18   Ht 5\' 7"  (1.702 m)   Wt 249 lb 12.5 oz (113.3 kg)   SpO2 93%   BMI 39.12 kg/m   HEMODYNAMICS:    VENTILATOR SETTINGS:    INTAKE / OUTPUT: I/O last 3 completed shifts: In: 481.6 [I.V.:431.6; Other:50] Out: -   PHYSICAL EXAMINATION: General: awake,No distress Neuro:  A ox3 non focal HEENT:  jvdwnl Cardiovascular:  s1 s2 RR NOT tachy,slight distant,noheeve Lungs:  Slight distant CTA Abdomen:  Soft,obese,nT,no r/g Musculoskeletal:  Mild edema,norash,no tenderness Skin:  No rash  LABS:  BMET  Recent Labs Lab 08/15/15 1037 08/17/15 1716 08/18/15 0354  NA 140 139 142  K 3.5 4.2 3.5  CL 105 110 109  CO2 26 21* 27  BUN 18 21* 19  CREATININE 0.90 1.06* 1.04*  GLUCOSE 153* 164* 105*    Electrolytes  Recent Labs Lab 08/15/15 1037 08/17/15 1716 08/18/15 0354  CALCIUM 9.6 9.2 8.9    CBC  Recent Labs Lab 08/15/15 1037 08/17/15 1814 08/18/15 0354  WBC 12.2* 10.4 8.8  HGB 13.1 12.8 11.7*  HCT  39.9 40.3 36.8  PLT 218.0 214 195    Coag's  Recent Labs Lab 08/17/15 1716  INR 0.93    Sepsis Markers No results for input(s): LATICACIDVEN, PROCALCITON, O2SATVEN in the last 168 hours.  ABG No results for input(s): PHART, PCO2ART, PO2ART in the last 168 hours.  Liver Enzymes No results for input(s): AST, ALT, ALKPHOS, BILITOT, ALBUMIN in the last 168 hours.  Cardiac Enzymes  Recent Labs Lab 08/17/15 1814  TROPONINI <0.03    Glucose  Recent Labs Lab 08/17/15 2129  GLUCAP 131*    Imaging Dg Chest 2 View  Result Date: 08/17/2015 CLINICAL DATA:  Shortness of breath, progressive. History of sarcoidosis EXAM: CHEST  2 VIEW COMPARISON:  Chest CT February 02, 2013; chest radiograph August 15, 2015 FINDINGS: There is slight scarring in the right upper lobe and right base regions, stable. There is no edema or consolidation. The heart size and pulmonary vascular normal. There is calcification in the aorta. Hilar prominence, more on the right than on the left, is stable and consistent with the known adenopathy secondary to sarcoidosis. There is no apparent bone lesion. IMPRESSION: Persistent hilar fullness consistent with adenopathy from sarcoidosis, stable. Areas of mild scarring on the right. No edema or consolidation. Stable cardiac silhouette. Aortic atherosclerosis. Electronically Signed   By: Lowella Grip III M.D.   On: 08/17/2015 10:36   Nm Pulmonary  Per & Vent  Result Date: 08/17/2015 CLINICAL DATA:  Short of breath since April. History of sarcoidosis. Additional history of knee replacement in June 2017. EXAM: NUCLEAR MEDICINE VENTILATION - PERFUSION LUNG SCAN TECHNIQUE: Ventilation images were obtained in multiple projections using inhaled aerosol Tc-100m DTPA. Perfusion images were obtained in multiple projections after intravenous injection of Tc-17m MAA. RADIOPHARMACEUTICALS:  Thirty-two mCi Technetium-74m DTPA aerosol inhalation and 3.9 mCi Technetium-81m MAA IV  COMPARISON:  Radiograph 08/03/ 2017 FINDINGS: Exam is suboptimal as the patient's arms are at her site and the patient is rotated. Ventilation: There is mild heterogeneity of ventilation. Decreased ventilation in the upper lobes in a regional pattern. Perfusion: There is decreased profusion to the RIGHT upper lobe which is a more severe defect within than the regional decreased perfusion on ventilation. Additionally, there is large profusion defect within the LEFT lower lobe which is unmatched on ventilation. IMPRESSION: Findings consistent with RIGHT upper lobe and LEFT lower lobe PULMONARY EMBOLISM. As this patient reports persistent shortness of breath over months and knee surgery in June 2017 with no increased short of breath over the immediate time period findings could indicate subacute or chronic pulmonary embolism. Recommend clinical correlation. Critical Value results were called by telephone at the time of interpretation on 08/17/2015 at 12:42 pm to Dr. Baird Lyons, who verbally acknowledged these results. Electronically Signed   By: Suzy Bouchard M.D.   On: 08/17/2015 12:43     STUDIES:  8/3 2017 V/Q Scan LLL and RUL acute PE  CT chest 01/2013 Stable changes of sarcoid within the lungs and mediastinum.  Bilateral upper lobe nodular and mass like opacities are unchanged   PFT 05/2014 >> FVC 65%, ratio 69, FEV1 58%, DLCO 62%     DISCUSSION: 55 year old female with a past medical history significant for sarcoidosis came to the [facility] emergency department on August 13,017 after she was diagnosed with an acute pulmonary emboli. She is currently hemodynamically stable and just needs anticoagulation. Risk factor for  pulmonary embolism is most likely her recent total knee replacement in June 2017.  ASSESSMENT / PLAN:  PULMONARY A: Provoked pulmonary embolism Pulmonary sarcoidosis > doubt flare P:   dc steroids and antibiotics Heparin per pharmacy Check echocardiogram Check lower  extremity Doppler Plan to transition to Xarelto or other direct oral anticoagulation agent on 08/18/2015 Would plan for 6 months of anticoagulation     ENDOCRINE A:   Diabetes mellitus type 2 P:   Sliding scale insulin   FAMILY  - Updates: sister & pt  - Inter-disciplinary family meet or Palliative Care meeting due by:    Summary - transition to xarelto, check duplex , echo If uneventful, plan for dc tomorrow She has dental work planned for Monday- she will inform dentist about anticoagulation OV in 4 wks with TP/ me Transfer to tele  Kara Mead MD. [place] Flood. [place] [facility] Pager [phone] If no response call [phone]   08/18/2015

## 2015-08-18 NOTE — Progress Notes (Addendum)
ANTICOAGULATION CONSULT NOTE - f/u Consult  Pharmacy Consult for Heparin Indication: pulmonary embolus  Allergies  Allergen Reactions  . Enalapril Maleate     REACTION: cough  . Hydrochlorothiazide     REACTION: Low potassium  . Hydroxychloroquine Sulfate     REACTION: vision changes  . Iodine     Mother was intolerant--CODED on CT table---pt never tired.  . Latex     Hives  . Nickel Other (See Comments)    Pt has a titanium right knee - nickel causes skin irritations that forms into blisters and sores   Patient Measurements: Height: 5\' 7"  (170.2 cm) Weight: 249 lb 12.5 oz (113.3 kg) IBW/kg (Calculated) : 61.6 Heparin Dosing Weight: 88.3 kg  Vital Signs: Temp: 98.1 F (36.7 C) (08/04 0400) Temp Source: Axillary (08/04 0400) BP: 113/78 (08/04 0821) Pulse Rate: 67 (08/04 0821)  Labs:  Recent Labs  08/15/15 1037 08/17/15 1716 08/17/15 1814 08/18/15 0004 08/18/15 0354 08/18/15 0701  HGB 13.1  --  12.8  --  11.7*  --   HCT 39.9  --  40.3  --  36.8  --   PLT 218.0  --  214  --  195  --   LABPROT  --  12.5  --   --   --   --   INR  --  0.93  --   --   --   --   HEPARINUNFRC  --   --   --  0.97*  --  0.94*  CREATININE 0.90 1.06*  --   --  1.04*  --   TROPONINI  --   --  <0.03  --   --   --    Estimated Creatinine Clearance: 79.4 mL/min (by C-G formula based on SCr of 1.04 mg/dL).     Assessment: 69 yoF [facility], complaint of chronic SHOB. Had elevated D-dimer and VQ scan + for PE. Refused CT (noted mother had contrast allergy). Hx of Sarcoidosis and TKA in June, on Xarelto post op. Pharmacy was asked to start heparin.  Today, 8/4 0700 HL=0.94 (supratherapeutic), no infusion or bleeding problems per RN. Order to switch to Xarelto.  Goal of Therapy:  Monitor platelets by anticoagulation protocol: Yes   Plan:   Stop heparin infusion now ~0900.  Start Xarelto one hour later rather than simultaneously since heparin level was elevated. Will start  15mg  bid x 21 days then 20mg  daily.  Consider prescribing the Xarelto starter pack at discharge.  Counseled the patient. She tolerated Xarelto at the 10mg  daily dose.  F/u daily.   Romeo Rabon, PharmD, pager [phone]. 08/18/2015,8:56 AM.

## 2015-08-19 LAB — CBC
HCT: 38.7 % (ref 36.0–46.0)
Hemoglobin: 12.4 g/dL (ref 12.0–15.0)
MCH: 30.2 pg (ref 26.0–34.0)
MCHC: 32 g/dL (ref 30.0–36.0)
MCV: 94.2 fL (ref 78.0–100.0)
PLATELETS: 192 10*3/uL (ref 150–400)
RBC: 4.11 MIL/uL (ref 3.87–5.11)
RDW: 13.7 % (ref 11.5–15.5)
WBC: 7 10*3/uL (ref 4.0–10.5)

## 2015-08-19 LAB — GLUCOSE, CAPILLARY
GLUCOSE-CAPILLARY: 93 mg/dL (ref 65–99)
Glucose-Capillary: 132 mg/dL — ABNORMAL HIGH (ref 65–99)

## 2015-08-19 MED ORDER — RIVAROXABAN 20 MG PO TABS: 20.0000 mg | ORAL_TABLET | Freq: Every day | ORAL | 5 refills | Status: DC

## 2015-08-19 MED ORDER — RIVAROXABAN 15 MG PO TABS: 15.0000 mg | ORAL_TABLET | Freq: Two times a day (BID) | ORAL | 0 refills | Status: DC

## 2015-08-19 NOTE — Progress Notes (Signed)
PULMONARY / CRITICAL CARE MEDICINE   Name: [name] MRN: [id number]  DOB: 1960-12-09    ADMISSION DATE:  08/17/2015 CONSULTATION DATE:  08/17/2015  REFERRING MD:  Dr. Jeneen Rinks, ER physician  CHIEF COMPLAINT:  Shortness of breath, diagnosed with PE  HISTORY OF PRESENT ILLNESS:   55 year old female with a past medical history significant for sarcoidosis followed by the pulmonary clinic for the same developed dyspnea and was evaluated by pulmonary in clinic on 08/15/2015. She was treated with a prednisone taper and antibiotics and a d-dimer was checked. The d-dimer is noted to be positive. She was referred for a VQ scan that showed a perfusion-ventilation deficit in the RUL and LLL.  Based on these findings she was referred to the ER for admission.    SUBJECTIVE:  Pain med for recent knee surgery and also for more recent pain left calf No dyspnea No CP No blood afebrile  VITAL SIGNS: BP 125/80 (BP Location: Left Arm)   Pulse 84   Temp 98.3 F (36.8 C) (Oral)   Resp 18   Ht 5\' 7"  (1.702 m)   Wt 119.2 kg (262 lb 12.6 oz)   SpO2 97%   BMI 41.16 kg/m   HEMODYNAMICS:    VENTILATOR SETTINGS:    INTAKE / OUTPUT: I/O last 3 completed shifts: In: 2418.3 [P.O.:720; I.V.:1658.3; Other:40] Out: -   PHYSICAL EXAMINATION: General: awake,No distress, obese F feeding herself, sitting up in bed. Nurse in room Neuro:  A ox3 non focal HEENT:  jvdwnl Cardiovascular:  s1 s2 RR NOT tachy,slight distant,noheeve Lungs:  Slight distant CTA Abdomen:  Soft,obese,nT,no r/g Musculoskeletal:  Mild edema,norash,no tenderness Skin:  No rash I don't feel enlarged node L groin referred to in Doppler report.  LABS:  BMET  Recent Labs Lab 08/15/15 1037 08/17/15 1716 08/18/15 0354  NA 140 139 142  K 3.5 4.2 3.5  CL 105 110 109  CO2 26 21* 27  BUN 18 21* 19  CREATININE 0.90 1.06* 1.04*  GLUCOSE 153* 164* 105*    Electrolytes  Recent Labs Lab 08/15/15 1037  08/17/15 1716 08/18/15 0354  CALCIUM 9.6 9.2 8.9    CBC  Recent Labs Lab 08/17/15 1814 08/18/15 0354 08/19/15 0512  WBC 10.4 8.8 7.0  HGB 12.8 11.7* 12.4  HCT 40.3 36.8 38.7  PLT 214 195 192    Coag's  Recent Labs Lab 08/17/15 1716  INR 0.93    Sepsis Markers No results for input(s): LATICACIDVEN, PROCALCITON, O2SATVEN in the last 168 hours.  ABG No results for input(s): PHART, PCO2ART, PO2ART in the last 168 hours.  Liver Enzymes No results for input(s): AST, ALT, ALKPHOS, BILITOT, ALBUMIN in the last 168 hours.  Cardiac Enzymes  Recent Labs Lab 08/17/15 1814  TROPONINI <0.03    Glucose  Recent Labs Lab 08/17/15 2129 08/18/15 0735 08/18/15 1156 08/18/15 1702 08/18/15 2204 08/19/15 0718  GLUCAP 131* 101* 120* 112* 110* 93    Imaging No results found.   STUDIES:  8/3 2017 V/Q Scan LLL and RUL acute PE  CT chest 01/2013 Stable changes of sarcoid within the lungs and mediastinum.  Bilateral upper lobe nodular and mass like opacities are unchanged   PFT 05/2014 >> FVC 65%, ratio 69, FEV1 58%, DLCO 62%  ECHO- 8/4 pending  Venous Dopplers 8/4- neg for clot. Possible L inguinal node     DISCUSSION: 55 year old female with a past medical history significant for sarcoidosis came to the [facility] emergency department on August 13,017  after she was diagnosed with  acute pulmonary emboli. She is currently hemodynamically stable and just needs anticoagulation. Risk factor for  pulmonary embolism is most likely her recent total knee replacement in June 2017. Had been on xarelto w/o problem in past while in rehab.  ASSESSMENT / PLAN:  PULMONARY A: Provoked pulmonary embolism Pulmonary sarcoidosis > doubt flare P:   dc steroids and antibiotics Heparin per pharmacy Check echocardiogram on f/u Plan to transition to Xarelto on 08/18/2015 Would plan for 6 months of anticoagulation     ENDOCRINE A:   Diabetes mellitus type 2 P:   Sliding  scale insulin   FAMILY  -   -  Summary - transition to xarelto, check duplex , echo She has dental work planned for Monday- she will inform dentist about anticoagulation OV in 4 wks with TP/ Dr Elsworth Soho  Bartholomew Crews, MD. [facility] Pager [phone] If no response call [phone]   08/19/2015

## 2015-08-20 DIAGNOSIS — E119 Type 2 diabetes mellitus without complications: Secondary | ICD-10-CM | POA: Diagnosis not present

## 2015-08-20 DIAGNOSIS — M1990 Unspecified osteoarthritis, unspecified site: Secondary | ICD-10-CM | POA: Diagnosis not present

## 2015-08-20 DIAGNOSIS — D869 Sarcoidosis, unspecified: Secondary | ICD-10-CM | POA: Diagnosis not present

## 2015-08-20 DIAGNOSIS — M797 Fibromyalgia: Secondary | ICD-10-CM | POA: Diagnosis not present

## 2015-08-20 DIAGNOSIS — M069 Rheumatoid arthritis, unspecified: Secondary | ICD-10-CM | POA: Diagnosis not present

## 2015-08-20 DIAGNOSIS — Z471 Aftercare following joint replacement surgery: Secondary | ICD-10-CM | POA: Diagnosis not present

## 2015-08-21 ENCOUNTER — Telehealth: Payer: Self-pay | Admitting: Adult Health

## 2015-08-21 NOTE — Telephone Encounter (Signed)
Called and spoke to pt. Pt questioning if she should still be on prednisone since being d/c from hospital. Pt was given a pred taper while in office on 08/15/2015 but then was later admitted to hospital on 08/17/2015. Per pt's d/c paperwork it does not have her taking prednisone. Advised pt that she does not need to take the rest of her steroids per her d/c instructions. Pt verbalized understanding and denied any further questions or concerns at this time.   Discharge Plan:  Xarelto 15 mg BID x 20 days, then decrease to 20 mg QD Plan for 6 months anticoagulation  Follow up with Dr. Elsworth Soho as scheduled 09/12/15 at 1:30 pm  Resume home PRN albuterol  No further steroids or abx .  D/c Voltaren while on Xarelto

## 2015-08-22 ENCOUNTER — Telehealth: Payer: Self-pay | Admitting: *Deleted

## 2015-08-22 ENCOUNTER — Ambulatory Visit: Payer: Medicare Other | Admitting: Certified Nurse Midwife

## 2015-08-22 NOTE — Telephone Encounter (Signed)
Pt was on TCM list admitted for Provoked Pulmonary Embolism & Pulmonary Sarcoidosis. PT WAS D/C 8/5, and will f/u w/pulmonologist Dr. Elsworth Soho on 8/29.../lm,b

## 2015-08-23 DIAGNOSIS — D869 Sarcoidosis, unspecified: Secondary | ICD-10-CM | POA: Diagnosis not present

## 2015-08-23 DIAGNOSIS — M069 Rheumatoid arthritis, unspecified: Secondary | ICD-10-CM | POA: Diagnosis not present

## 2015-08-23 DIAGNOSIS — Z471 Aftercare following joint replacement surgery: Secondary | ICD-10-CM | POA: Diagnosis not present

## 2015-08-23 DIAGNOSIS — M797 Fibromyalgia: Secondary | ICD-10-CM | POA: Diagnosis not present

## 2015-08-23 DIAGNOSIS — M1990 Unspecified osteoarthritis, unspecified site: Secondary | ICD-10-CM | POA: Diagnosis not present

## 2015-08-23 DIAGNOSIS — E119 Type 2 diabetes mellitus without complications: Secondary | ICD-10-CM | POA: Diagnosis not present

## 2015-08-25 DIAGNOSIS — M069 Rheumatoid arthritis, unspecified: Secondary | ICD-10-CM | POA: Diagnosis not present

## 2015-08-25 DIAGNOSIS — D869 Sarcoidosis, unspecified: Secondary | ICD-10-CM | POA: Diagnosis not present

## 2015-08-25 DIAGNOSIS — M1990 Unspecified osteoarthritis, unspecified site: Secondary | ICD-10-CM | POA: Diagnosis not present

## 2015-08-25 DIAGNOSIS — E119 Type 2 diabetes mellitus without complications: Secondary | ICD-10-CM | POA: Diagnosis not present

## 2015-08-25 DIAGNOSIS — M797 Fibromyalgia: Secondary | ICD-10-CM | POA: Diagnosis not present

## 2015-08-25 DIAGNOSIS — Z471 Aftercare following joint replacement surgery: Secondary | ICD-10-CM | POA: Diagnosis not present

## 2015-08-28 DIAGNOSIS — M797 Fibromyalgia: Secondary | ICD-10-CM | POA: Diagnosis not present

## 2015-08-28 DIAGNOSIS — Z471 Aftercare following joint replacement surgery: Secondary | ICD-10-CM | POA: Diagnosis not present

## 2015-08-28 DIAGNOSIS — M069 Rheumatoid arthritis, unspecified: Secondary | ICD-10-CM | POA: Diagnosis not present

## 2015-08-28 DIAGNOSIS — M1990 Unspecified osteoarthritis, unspecified site: Secondary | ICD-10-CM | POA: Diagnosis not present

## 2015-08-28 DIAGNOSIS — D869 Sarcoidosis, unspecified: Secondary | ICD-10-CM | POA: Diagnosis not present

## 2015-08-28 DIAGNOSIS — E119 Type 2 diabetes mellitus without complications: Secondary | ICD-10-CM | POA: Diagnosis not present

## 2015-08-30 DIAGNOSIS — D869 Sarcoidosis, unspecified: Secondary | ICD-10-CM | POA: Diagnosis not present

## 2015-08-30 DIAGNOSIS — E119 Type 2 diabetes mellitus without complications: Secondary | ICD-10-CM | POA: Diagnosis not present

## 2015-08-30 DIAGNOSIS — Z471 Aftercare following joint replacement surgery: Secondary | ICD-10-CM | POA: Diagnosis not present

## 2015-08-30 DIAGNOSIS — M1990 Unspecified osteoarthritis, unspecified site: Secondary | ICD-10-CM | POA: Diagnosis not present

## 2015-08-30 DIAGNOSIS — M797 Fibromyalgia: Secondary | ICD-10-CM | POA: Diagnosis not present

## 2015-08-30 DIAGNOSIS — M069 Rheumatoid arthritis, unspecified: Secondary | ICD-10-CM | POA: Diagnosis not present

## 2015-09-05 ENCOUNTER — Ambulatory Visit (INDEPENDENT_AMBULATORY_CARE_PROVIDER_SITE_OTHER): Payer: Medicare Other | Admitting: Internal Medicine

## 2015-09-05 ENCOUNTER — Encounter: Payer: Self-pay | Admitting: Internal Medicine

## 2015-09-05 VITALS — BP 110/54 | HR 85 | Temp 98.2°F | Ht 67.0 in | Wt 254.0 lb

## 2015-09-05 DIAGNOSIS — E538 Deficiency of other specified B group vitamins: Secondary | ICD-10-CM

## 2015-09-05 DIAGNOSIS — I2699 Other pulmonary embolism without acute cor pulmonale: Secondary | ICD-10-CM

## 2015-09-05 DIAGNOSIS — I10 Essential (primary) hypertension: Secondary | ICD-10-CM | POA: Diagnosis not present

## 2015-09-05 DIAGNOSIS — R21 Rash and other nonspecific skin eruption: Secondary | ICD-10-CM

## 2015-09-05 DIAGNOSIS — Z23 Encounter for immunization: Secondary | ICD-10-CM

## 2015-09-05 DIAGNOSIS — I2510 Atherosclerotic heart disease of native coronary artery without angina pectoris: Secondary | ICD-10-CM

## 2015-09-05 DIAGNOSIS — E118 Type 2 diabetes mellitus with unspecified complications: Secondary | ICD-10-CM

## 2015-09-05 MED ORDER — CYANOCOBALAMIN 1000 MCG/ML IJ SOLN
1000.0000 ug | Freq: Once | INTRAMUSCULAR | Status: AC
  Administered 2015-09-05: 1000 ug via INTRAMUSCULAR

## 2015-09-05 NOTE — Assessment & Plan Note (Signed)
Worse - she is off steroids Pt declined Prednisone Derm consult

## 2015-09-05 NOTE — Assessment & Plan Note (Addendum)
On Metformin Pt declined labs today

## 2015-09-05 NOTE — Assessment & Plan Note (Addendum)
NAS diet Coreg

## 2015-09-05 NOTE — Assessment & Plan Note (Signed)
8/17 on Xarelto now.  RUL and LLE PE on VQ scan.

## 2015-09-05 NOTE — Progress Notes (Signed)
Subjective:  Patient ID: [name], female    DOB: [birth year]  Age: 55 y.o. MRN: [id number]  CC: No chief complaint on file.   HPI [name] presents for a post-hospital follow up for a PE - on Xarelto now. D/c summary was reviewed: RUL and LLE PE on VQ scan.  C/o growing skin lesion on L arm - worse F/u DM, HTN, B12 def  Outpatient Medications Prior to Visit  Medication Sig Dispense Refill  . albuterol (PROVENTIL HFA;VENTOLIN HFA) 108 (90 Base) MCG/ACT inhaler Inhale 2 puffs into the lungs every 6 (six) hours as needed for wheezing or shortness of breath. 1 Inhaler 3  . amoxicillin (AMOXIL) 500 MG capsule Take for prior to dental procdures    . Blood Glucose Monitoring Suppl (FREESTYLE LITE) DEVI Use to check blood sugar 2 times per day 1 each 0  . carvedilol (COREG) 12.5 MG tablet Take 1 tablet (12.5 mg total) by mouth 2 (two) times daily with a meal. 180 tablet 3  . chlorhexidine (PERIDEX) 0.12 % solution Use as directed 5 mLs in the mouth or throat 2 (two) times daily as needed (thrush).   0  . clobetasol (TEMOVATE) 0.05 % external solution Apply 1 application topically 2 (two) times daily.  3  . docusate sodium (COLACE) 100 MG capsule Take 100 mg by mouth 2 (two) times daily.     . ergocalciferol (VITAMIN D2) 50000 units capsule Take 1 capsule (50,000 Units total) by mouth every 30 (thirty) days. 3 capsule 3  . escitalopram (LEXAPRO) 20 MG tablet TAKE 1 TABLET BY MOUTH DAILY 90 tablet 3  . furosemide (LASIX) 20 MG tablet Take 1 tablet (20 mg total) by mouth daily. (Patient taking differently: Take 20 mg by mouth daily as needed for fluid or edema. ) 30 tablet 3  . gabapentin (NEURONTIN) 300 MG capsule Take 300 mg by mouth 2 (two) times daily.    [name] glucose blood (FREESTYLE LITE) test strip TEST TWICE DAILY 100 each 3  . hydroquinone 4 % cream APPLY TO THE DARK SPOTS ON THE SKIN QHS FOR 3 MONTHS AS DIRECTED  0  . KLOR-CON 10 10 MEQ tablet TAKE 1 TABLET BY  MOUTH DAILY 90 tablet 3  . Lancets (FREESTYLE) lancets 1 each by Other route 2 (two) times daily. Use as instructed 100 each 12  . metFORMIN (GLUCOPHAGE) 500 MG tablet Take 1,000 mg by mouth daily.    . methocarbamol (ROBAXIN) 500 MG tablet Take 500 mg by mouth 2 (two) times daily.    . nortriptyline (PAMELOR) 75 MG capsule Take 1 capsule (75 mg total) by mouth at bedtime. 30 capsule 5  . omeprazole (PRILOSEC) 20 MG capsule Take 20 mg by mouth daily.    . polyethylene glycol (MIRALAX / GLYCOLAX) packet Take 17 g by mouth daily.    . Rivaroxaban (XARELTO) 15 MG TABS tablet Take 1 tablet (15 mg total) by mouth 2 (two) times daily with a meal. 40 tablet 0  . [START ON 09/08/2015] rivaroxaban (XARELTO) 20 MG TABS tablet Take 1 tablet (20 mg total) by mouth daily with supper. 30 tablet 5  . Suvorexant (BELSOMRA) 20 MG TABS Take 20 mg by mouth at bedtime. 30 tablet 5  . topiramate (TOPAMAX) 50 MG tablet TAKE 1 TABLET BY MOUTH TWICE DAILY 180 tablet 2  . oxyCODONE (OXY IR/ROXICODONE) 5 MG immediate release tablet Take 1-2 tablets (5-10 mg total) by mouth every 3 (three) hours as needed for  breakthrough pain. (Patient not taking: Reported on 09/05/2015) 60 tablet 0   No facility-administered medications prior to visit.     ROS Review of Systems  Constitutional: Positive for fatigue. Negative for activity change, appetite change, chills and unexpected weight change.  HENT: Negative for congestion, mouth sores and sinus pressure.   Eyes: Negative for visual disturbance.  Respiratory: Negative for cough and chest tightness.   Gastrointestinal: Negative for abdominal pain and nausea.  Genitourinary: Negative for difficulty urinating, frequency and vaginal pain.  Musculoskeletal: Positive for arthralgias and gait problem. Negative for back pain.  Skin: Positive for rash and wound. Negative for pallor.  Neurological: Negative for dizziness, tremors, weakness, numbness and headaches.    Psychiatric/Behavioral: Positive for dysphoric mood. Negative for confusion and sleep disturbance. The patient is nervous/anxious.     Objective:  BP (!) 110/54   Pulse 85   Temp 98.2 F (36.8 C) (Oral)   Ht 5\' 7"  (1.702 m)   Wt 254 lb (115.2 kg)   SpO2 95%   BMI 39.78 kg/m   BP Readings from Last 3 Encounters:  09/05/15 (!) 110/54  08/19/15 125/80  08/15/15 110/74    Wt Readings from Last 3 Encounters:  09/05/15 254 lb (115.2 kg)  08/19/15 262 lb 12.6 oz (119.2 kg)  08/15/15 253 lb (114.8 kg)    Physical Exam  Constitutional: She appears well-developed. No distress.  HENT:  Head: Normocephalic.  Right Ear: External ear normal.  Left Ear: External ear normal.  Nose: Nose normal.  Mouth/Throat: Oropharynx is clear and moist.  Eyes: Conjunctivae are normal. Pupils are equal, round, and reactive to light. Right eye exhibits no discharge. Left eye exhibits no discharge.  Neck: Normal range of motion. Neck supple. No JVD present. No tracheal deviation present. No thyromegaly present.  Cardiovascular: Normal rate, regular rhythm and normal heart sounds.   Pulmonary/Chest: No stridor. No respiratory distress. She has no wheezes.  Abdominal: Soft. Bowel sounds are normal. She exhibits no distension and no mass. There is no tenderness. There is no rebound and no guarding.  Musculoskeletal: She exhibits edema and tenderness.  Lymphadenopathy:    She has no cervical adenopathy.  Neurological: She displays normal reflexes. No cranial nerve deficit. She exhibits normal muscle tone. Coordination abnormal.  Skin: Rash noted. There is erythema.  Psychiatric: She has a normal mood and affect. Her behavior is normal. Judgment and thought content normal.  L arm large fleshy growth Walker Obese  Lab Results  Component Value Date   WBC 7.0 08/19/2015   HGB 12.4 08/19/2015   HCT 38.7 08/19/2015   PLT 192 08/19/2015   GLUCOSE 105 (H) 08/18/2015   CHOL 193 06/29/2008   TRIG 145.0  06/29/2008   HDL 67.50 06/29/2008   LDLCALC 97 06/29/2008   ALT 21 07/19/2015   AST 21 07/19/2015   NA 142 08/18/2015   K 3.5 08/18/2015   CL 109 08/18/2015   CREATININE 1.04 (H) 08/18/2015   BUN 19 08/18/2015   CO2 27 08/18/2015   TSH 1.82 06/08/2015   INR 0.93 08/17/2015   HGBA1C 5.8 (H) 07/04/2015   MICROALBUR <0.7 02/01/2015    Dg Chest 2 View  Result Date: 08/17/2015 CLINICAL DATA:  Shortness of breath, progressive. History of sarcoidosis EXAM: CHEST  2 VIEW COMPARISON:  Chest CT February 02, 2013; chest radiograph August 15, 2015 FINDINGS: There is slight scarring in the right upper lobe and right base regions, stable. There is no edema or consolidation. The heart  size and pulmonary vascular normal. There is calcification in the aorta. Hilar prominence, more on the right than on the left, is stable and consistent with the known adenopathy secondary to sarcoidosis. There is no apparent bone lesion. IMPRESSION: Persistent hilar fullness consistent with adenopathy from sarcoidosis, stable. Areas of mild scarring on the right. No edema or consolidation. Stable cardiac silhouette. Aortic atherosclerosis. Electronically Signed   By: Lowella Grip III M.D.   On: 08/17/2015 10:36   Nm Pulmonary Per & Vent  Result Date: 08/17/2015 CLINICAL DATA:  Short of breath since April. History of sarcoidosis. Additional history of knee replacement in June 2017. EXAM: NUCLEAR MEDICINE VENTILATION - PERFUSION LUNG SCAN TECHNIQUE: Ventilation images were obtained in multiple projections using inhaled aerosol Tc-21m DTPA. Perfusion images were obtained in multiple projections after intravenous injection of Tc-50m MAA. RADIOPHARMACEUTICALS:  Thirty-two mCi Technetium-69m DTPA aerosol inhalation and 3.9 mCi Technetium-54m MAA IV COMPARISON:  Radiograph 08/03/ 2017 FINDINGS: Exam is suboptimal as the patient's arms are at her site and the patient is rotated. Ventilation: There is mild heterogeneity of ventilation.  Decreased ventilation in the upper lobes in a regional pattern. Perfusion: There is decreased profusion to the RIGHT upper lobe which is a more severe defect within than the regional decreased perfusion on ventilation. Additionally, there is large profusion defect within the LEFT lower lobe which is unmatched on ventilation. IMPRESSION: Findings consistent with RIGHT upper lobe and LEFT lower lobe PULMONARY EMBOLISM. As this patient reports persistent shortness of breath over months and knee surgery in June 2017 with no increased short of breath over the immediate time period findings could indicate subacute or chronic pulmonary embolism. Recommend clinical correlation. Critical Value results were called by telephone at the time of interpretation on 08/17/2015 at 12:42 pm to Dr. Baird Lyons, who verbally acknowledged these results. Electronically Signed   By: Suzy Bouchard M.D.   On: 08/17/2015 12:43    Assessment & Plan:   There are no diagnoses linked to this encounter. I am having Ms. Altmann maintain her clobetasol, glucose blood, freestyle, FREESTYLE LITE, KLOR-CON 10, topiramate, methocarbamol, carvedilol, hydroquinone, albuterol, chlorhexidine, Suvorexant, ergocalciferol, furosemide, nortriptyline, gabapentin, escitalopram, oxyCODONE, metFORMIN, docusate sodium, polyethylene glycol, omeprazole, amoxicillin, Rivaroxaban, and rivaroxaban.  No orders of the defined types were placed in this encounter.    Follow-up: No Follow-up on file.  Walker Kehr, MD

## 2015-09-05 NOTE — Assessment & Plan Note (Signed)
On B12 Risks associated with treatment noncompliance were discussed. Compliance was encouraged. IM

## 2015-09-05 NOTE — Progress Notes (Signed)
Pre visit review using our clinic review tool, if applicable. No additional management support is needed unless otherwise documented below in the visit note. 

## 2015-09-05 NOTE — Addendum Note (Signed)
Addended by: Cresenciano Lick on: 09/05/2015 11:21 AM   Modules accepted: Orders

## 2015-09-05 NOTE — Assessment & Plan Note (Signed)
[facility]

## 2015-09-12 ENCOUNTER — Encounter: Payer: Self-pay | Admitting: Pulmonary Disease

## 2015-09-12 ENCOUNTER — Ambulatory Visit (INDEPENDENT_AMBULATORY_CARE_PROVIDER_SITE_OTHER): Payer: Medicare Other | Admitting: Pulmonary Disease

## 2015-09-12 DIAGNOSIS — I2510 Atherosclerotic heart disease of native coronary artery without angina pectoris: Secondary | ICD-10-CM

## 2015-09-12 DIAGNOSIS — I2699 Other pulmonary embolism without acute cor pulmonale: Secondary | ICD-10-CM

## 2015-09-12 NOTE — Patient Instructions (Addendum)
It is nice to see you today. We will prescribe you a prednisone taper today. Prednisone taper; 5 mg tablets:  2 tablets for 2 weeks, then 1 tablet x 2 weeks, then stop.  Please contact Dr. Ubaldo Glassing about further treatment with prednisone. Continue your Xaralto 20 mg once daily through  [facility] February 2018 Follow up with Dr. Elsworth Soho or NP  in 4 months. Please call for any signs or symptoms of bleeding. Please contact office for sooner follow up if symptoms do not improve or worsen or seek emergency care

## 2015-09-12 NOTE — Progress Notes (Signed)
History of Present Illness [name] is a 55 y.o. female with pulmonary sarcoidosis, nodules, with dyspnea on exertion. And a provoked PE ( without right heart strain) 06/2015.    09/12/2015 Hospital Follow Up Appointment:  Pt. Returns to the office for hospital follow up for provoked PE. ( Knee Surgery in June 2017) She was discharged 08/19/2015 on  Xaralto. She states she has been compliant with her medication. She started the 20 mg once daily dosing on 09/08/2015. She continues to have some  dyspnea. She recognized that this may be due to over all physical deconditioning. As the PE was provoked,( knee surgery 6/17)  we will continue treatment with Xaralto x 6 months. She understands the importance of being compliant with her medication.She denies fever, chest pain, orthopnea or hemoptysis. She did state that she is having some nose bleeds. We have encouraged nasal saline, and told her if it continues we will refer to ENT.We discussed going to the Emergency room for any significant bleeding.She also has had a flare of her Lichen. We have prescribed a short term prednisone taper and have asked her to follow up with her dermatologist Dr. Ubaldo Glassing.   Admit date: 08/17/2015 Discharge date: 08/19/2015  Discharge Diagnoses:  Provoked Pulmonary Embolism  Pulmonary Sarcoidosis  OSA HTN Diabetes Mellitus Type 2 Chronic Pain  Fibromyalgia  Depression / Anxiety  Insomnia   Tests  PFT 05/2014 >> FVC 65%, ratio 69, FEV1 58%, DLCO 62%  VQ Scan 08/17/15 >> LLL & RUL acute PE   ECHO 08/18/15 >> mild LVH , EF 55-60%, Gr 1 DD .   LE Venous Doppler 08/18/15 >> neg DVT , no bakers cyst, ?prominent inguinal lymph node measuring 3.1cm    Past medical hx Past Medical History:  Diagnosis Date  . Allergic rhinitis   . ALLERGIC RHINITIS 10/26/2009  . Anxiety   . ANXIETY 08/14/2006  . B12 DEFICIENCY 08/25/2007  . Confusion   . Depression    takes Lexapro daily  . Diabetes mellitus without  complication ([place])    was on insulin but has been off since Nov 2015 and now only takes Metformin daily  . DYSPNEA 04/28/2009   with exertion  . Esophageal reflux    takes Nexium daily  . Fibromyalgia   . Headache    last migraine 2-42yrs ago;takes Topamax daily  . History of shingles   . Hypertension    takes Coreg daily  . Insomnia    takes Nortriptyline nightly   . Joint pain   . Joint swelling   . Left knee pain   . Lichen planus   . Long-term memory loss   . Nocturia   . OSA (obstructive sleep apnea)    doesn't use CPAP;sleep study in epic from 2006  . Osteoarthritis   . Osteoarthritis   . Pneumonia    over 30 yrs ago  . Protein calorie malnutrition ([place])   . Rheumatoid arthritis ([place])   . Sarcoidosis ([place])    Dr. Lolita Patella  . Short-term memory loss   . Shortness of breath   . TIA (transient ischemic attack)   . Unsteady gait   . Urinary urgency   . Vitamin D deficiency    is supposed to take Vit D but can't afford it  . VITAMIN D DEFICIENCY 08/25/2007     Past surgical hx, Family hx, Social hx all reviewed.  Current Outpatient Prescriptions on File Prior to Visit  Medication Sig  . albuterol (PROVENTIL HFA;VENTOLIN  HFA) 108 (90 Base) MCG/ACT inhaler Inhale 2 puffs into the lungs every 6 (six) hours as needed for wheezing or shortness of breath.  . Blood Glucose Monitoring Suppl (FREESTYLE LITE) DEVI Use to check blood sugar 2 times per day  . carvedilol (COREG) 12.5 MG tablet Take 1 tablet (12.5 mg total) by mouth 2 (two) times daily with a meal.  . chlorhexidine (PERIDEX) 0.12 % solution Use as directed 5 mLs in the mouth or throat 2 (two) times daily as needed (thrush).   . clobetasol (TEMOVATE) 0.05 % external solution Apply 1 application topically 2 (two) times daily.  [name] docusate sodium (COLACE) 100 MG capsule Take 100 mg by mouth 2 (two) times daily.   . ergocalciferol (VITAMIN D2) 50000 units capsule Take 1 capsule (50,000 Units total) by mouth every 30  (thirty) days.  [name] escitalopram (LEXAPRO) 20 MG tablet TAKE 1 TABLET BY MOUTH DAILY  . furosemide (LASIX) 20 MG tablet Take 1 tablet (20 mg total) by mouth daily. (Patient taking differently: Take 20 mg by mouth daily as needed for fluid or edema. )  . gabapentin (NEURONTIN) 300 MG capsule Take 300 mg by mouth 2 (two) times daily.  [name] glucose blood (FREESTYLE LITE) test strip TEST TWICE DAILY  . KLOR-CON 10 10 MEQ tablet TAKE 1 TABLET BY MOUTH DAILY  . Lancets (FREESTYLE) lancets 1 each by Other route 2 (two) times daily. Use as instructed  . metFORMIN (GLUCOPHAGE) 500 MG tablet Take 1,000 mg by mouth daily.  . nortriptyline (PAMELOR) 75 MG capsule Take 1 capsule (75 mg total) by mouth at bedtime.  [name] omeprazole (PRILOSEC) 20 MG capsule Take 20 mg by mouth daily.  [name] oxyCODONE (OXY IR/ROXICODONE) 5 MG immediate release tablet Take 1-2 tablets (5-10 mg total) by mouth every 3 (three) hours as needed for breakthrough pain.  . polyethylene glycol (MIRALAX / GLYCOLAX) packet Take 17 g by mouth daily.  . rivaroxaban (XARELTO) 20 MG TABS tablet Take 1 tablet (20 mg total) by mouth daily with supper.  . Suvorexant (BELSOMRA) 20 MG TABS Take 20 mg by mouth at bedtime.  . topiramate (TOPAMAX) 50 MG tablet TAKE 1 TABLET BY MOUTH TWICE DAILY  . amoxicillin (AMOXIL) 500 MG capsule Take for prior to dental procdures  . [DISCONTINUED] potassium chloride (K-DUR,KLOR-CON) 10 MEQ tablet TAKE 1 TABLET BY MOUTH DAILY   No current facility-administered medications on file prior to visit.      Allergies  Allergen Reactions  . Enalapril Maleate     REACTION: cough  . Hydrochlorothiazide     REACTION: Low potassium  . Hydroxychloroquine Sulfate     REACTION: vision changes  . Iodine     Mother was intolerant--CODED on CT table---pt never tired.  . Latex     Hives  . Nickel Other (See Comments)    Pt has a titanium right knee - nickel causes skin irritations that forms into blisters and sores    Review Of  Systems:  Constitutional:   No  weight loss, night sweats,  Fevers, chills, fatigue, or  lassitude.  HEENT:   No headaches,  Difficulty swallowing,  Tooth/dental problems, or  Sore throat,                No sneezing, itching, ear ache, nasal congestion, post nasal drip,   CV:  No chest pain,  Orthopnea, PND, swelling in lower extremities, anasarca, dizziness, palpitations, syncope.   GI  No heartburn, indigestion, abdominal pain, nausea, vomiting, diarrhea,  change in bowel habits, loss of appetite, bloody stools.   Resp: + shortness of breath with exertion less at rest.  No excess mucus, no productive cough,  No non-productive cough,  No coughing up of blood.  No change in color of mucus.  No wheezing.  No chest wall deformity  Skin: + rash left arm and scalp,  no  lesions.  GU: no dysuria, change in color of urine, no urgency or frequency.  No flank pain, no hematuria   MS:  No joint pain or swelling.  No decreased range of motion.  No back pain.  Psych:  No change in mood or affect. No depression or anxiety.  No memory loss.   Vital Signs BP 98/64 (BP Location: Left Arm, Cuff Size: Normal)   Pulse 68   Ht 5\' 7"  (1.702 m)   Wt 252 lb 3.2 oz (114.4 kg)   SpO2 96%   BMI 39.50 kg/m    Physical Exam:  General- No distress,  A&Ox3, obese using walker ENT: No sinus tenderness, TM clear, pale nasal mucosa, no oral exudate,no post nasal drip, no LAN Cardiac: S1, S2, regular rate and rhythm, no murmur Chest: No wheeze/ rales/ dullness; no accessory muscle use, no nasal flaring, no sternal retractions, diminished per bases bilaterally Abd.: Soft Non-tender Ext: No clubbing cyanosis, edema Neuro:  normal strength Skin: + rashes left upper arm and scalp., warm and dry Psych: normal mood and behavior   Assessment/Plan  Acute pulmonary embolism (HCC) Continuing improvement with compliance of Xaralto Treatment Plan: It is nice to see you today. We will prescribe you a prednisone  taper today. Prednisone taper; 5 mg tablets:  2 tablets for 2 weeks, then 1 tablet x 2 weeks, then stop.  Please contact Dr. Ubaldo Glassing about further treatment with prednisone. Continue your Xaralto 20 mg once daily through  [facility] February 2018 Follow up with Dr. Elsworth Soho or NP  in 4 months. Please call for any signs or symptoms of bleeding. Please contact office for sooner follow up if symptoms do not improve or worsen or seek emergency care       Magdalen Spatz, NP 09/12/2015  2:20 PM    She was hospitalized for acute shortness of breath, VQ scan was high probability, Doppler was negative and echo did not show any RV strain. This occurred in 08/2015 about 6 weeks after her knee replacement surgery. She had been complaining of some leg pain prior and venous Doppler was negative even prior to surgery. She is tolerating Xarelto well except for some nosebleeds. Dyspnea is at baseline. She has lichen planus on her upper body and scalp and these lesions are acting up with increasing itching in spite of application of local steroids. Exam is otherwise unchanged with decreased breath sounds bilateral, no leg  lesions or pedal edema At her request, we will give her low-dose prednisone 10 mg for 2 weeks followed by family for 2 weeks-I will give her enough time to contact her dermatologist to see if she needs an extended dose of prednisone or steroid sparing agent.  She will continue Xarelto until February 2017 If nosebleeds get worse, she may have to see ENT. She will continue nasal steroids and saline nasal spray in the meantime  Rigoberto Noel. MD

## 2015-09-12 NOTE — Assessment & Plan Note (Signed)
Continuing improvement with compliance of Xaralto Treatment Plan: It is nice to see you today. We will prescribe you a prednisone taper today. Prednisone taper; 5 mg tablets:  2 tablets for 2 weeks, then 1 tablet x 2 weeks, then stop.  Please contact Dr. Ubaldo Glassing about further treatment with prednisone. Continue your Xaralto 20 mg once daily through  [facility] February 2018 Follow up with Dr. Elsworth Soho or NP  in 4 months. Please call for any signs or symptoms of bleeding. Please contact office for sooner follow up if symptoms do not improve or worsen or seek emergency care

## 2015-09-17 ENCOUNTER — Other Ambulatory Visit: Payer: Self-pay | Admitting: Internal Medicine

## 2015-09-19 ENCOUNTER — Telehealth: Payer: Self-pay | Admitting: Pulmonary Disease

## 2015-09-19 ENCOUNTER — Encounter: Payer: Medicare Other | Admitting: Internal Medicine

## 2015-09-19 ENCOUNTER — Telehealth: Payer: Self-pay | Admitting: Internal Medicine

## 2015-09-19 MED ORDER — PREDNISONE 5 MG PO TABS: ORAL_TABLET | ORAL | 0 refills | Status: DC

## 2015-09-19 NOTE — Telephone Encounter (Signed)
Spoke with the pt  She states that pred was never sent to pharm  I apologized that it was not sent and have sent in to her pharmacy  Nothing further needed per pt

## 2015-09-19 NOTE — Telephone Encounter (Signed)
Ok to Rf Nexium? Med is not on active list?

## 2015-10-06 DIAGNOSIS — L723 Sebaceous cyst: Secondary | ICD-10-CM | POA: Diagnosis not present

## 2015-10-06 DIAGNOSIS — D863 Sarcoidosis of skin: Secondary | ICD-10-CM | POA: Diagnosis not present

## 2015-10-06 DIAGNOSIS — L930 Discoid lupus erythematosus: Secondary | ICD-10-CM | POA: Diagnosis not present

## 2015-10-06 DIAGNOSIS — D869 Sarcoidosis, unspecified: Secondary | ICD-10-CM | POA: Diagnosis not present

## 2015-10-19 DIAGNOSIS — L0109 Other impetigo: Secondary | ICD-10-CM | POA: Diagnosis not present

## 2015-10-19 DIAGNOSIS — L0101 Non-bullous impetigo: Secondary | ICD-10-CM | POA: Diagnosis not present

## 2015-10-19 DIAGNOSIS — D863 Sarcoidosis of skin: Secondary | ICD-10-CM | POA: Diagnosis not present

## 2015-10-19 DIAGNOSIS — Z79899 Other long term (current) drug therapy: Secondary | ICD-10-CM | POA: Diagnosis not present

## 2015-10-24 ENCOUNTER — Other Ambulatory Visit: Payer: Self-pay | Admitting: Internal Medicine

## 2015-10-24 DIAGNOSIS — Z1231 Encounter for screening mammogram for malignant neoplasm of breast: Secondary | ICD-10-CM

## 2015-10-25 ENCOUNTER — Encounter: Payer: Self-pay | Admitting: Pulmonary Disease

## 2015-10-26 DIAGNOSIS — L0109 Other impetigo: Secondary | ICD-10-CM | POA: Diagnosis not present

## 2015-10-26 DIAGNOSIS — D863 Sarcoidosis of skin: Secondary | ICD-10-CM | POA: Diagnosis not present

## 2015-10-26 DIAGNOSIS — Z79899 Other long term (current) drug therapy: Secondary | ICD-10-CM | POA: Diagnosis not present

## 2015-10-31 ENCOUNTER — Ambulatory Visit
Admission: RE | Admit: 2015-10-31 | Discharge: 2015-10-31 | Disposition: A | Discharge status: Home / Self Care | Payer: Medicare Other | Source: Ambulatory Visit | Attending: Internal Medicine | Admitting: Internal Medicine

## 2015-10-31 DIAGNOSIS — Z1231 Encounter for screening mammogram for malignant neoplasm of breast: Secondary | ICD-10-CM

## 2015-11-02 DIAGNOSIS — Z79899 Other long term (current) drug therapy: Secondary | ICD-10-CM | POA: Diagnosis not present

## 2015-11-02 DIAGNOSIS — D863 Sarcoidosis of skin: Secondary | ICD-10-CM | POA: Diagnosis not present

## 2015-11-09 DIAGNOSIS — D863 Sarcoidosis of skin: Secondary | ICD-10-CM | POA: Diagnosis not present

## 2015-11-09 DIAGNOSIS — Z79899 Other long term (current) drug therapy: Secondary | ICD-10-CM | POA: Diagnosis not present

## 2015-11-09 DIAGNOSIS — L720 Epidermal cyst: Secondary | ICD-10-CM | POA: Diagnosis not present

## 2015-11-16 ENCOUNTER — Other Ambulatory Visit: Payer: Self-pay | Admitting: Internal Medicine

## 2015-11-16 DIAGNOSIS — Z79899 Other long term (current) drug therapy: Secondary | ICD-10-CM | POA: Diagnosis not present

## 2015-11-16 DIAGNOSIS — D863 Sarcoidosis of skin: Secondary | ICD-10-CM | POA: Diagnosis not present

## 2015-11-23 ENCOUNTER — Ambulatory Visit: Payer: Medicare Other | Admitting: Internal Medicine

## 2015-11-27 ENCOUNTER — Ambulatory Visit: Payer: Medicare Other | Admitting: Pulmonary Disease

## 2015-12-14 DIAGNOSIS — L918 Other hypertrophic disorders of the skin: Secondary | ICD-10-CM | POA: Diagnosis not present

## 2015-12-14 DIAGNOSIS — D863 Sarcoidosis of skin: Secondary | ICD-10-CM | POA: Diagnosis not present

## 2015-12-14 DIAGNOSIS — L738 Other specified follicular disorders: Secondary | ICD-10-CM | POA: Diagnosis not present

## 2015-12-14 DIAGNOSIS — Z79899 Other long term (current) drug therapy: Secondary | ICD-10-CM | POA: Diagnosis not present

## 2015-12-15 ENCOUNTER — Other Ambulatory Visit: Payer: Self-pay | Admitting: Internal Medicine

## 2015-12-19 ENCOUNTER — Telehealth: Payer: Self-pay | Admitting: General Practice

## 2015-12-20 ENCOUNTER — Ambulatory Visit (INDEPENDENT_AMBULATORY_CARE_PROVIDER_SITE_OTHER): Payer: Medicare Other | Admitting: Internal Medicine

## 2015-12-20 ENCOUNTER — Other Ambulatory Visit: Payer: Medicare Other

## 2015-12-20 ENCOUNTER — Encounter: Payer: Self-pay | Admitting: Internal Medicine

## 2015-12-20 DIAGNOSIS — D869 Sarcoidosis, unspecified: Secondary | ICD-10-CM

## 2015-12-20 DIAGNOSIS — R635 Abnormal weight gain: Secondary | ICD-10-CM | POA: Diagnosis not present

## 2015-12-20 DIAGNOSIS — I10 Essential (primary) hypertension: Secondary | ICD-10-CM

## 2015-12-20 DIAGNOSIS — I2510 Atherosclerotic heart disease of native coronary artery without angina pectoris: Secondary | ICD-10-CM

## 2015-12-20 DIAGNOSIS — E118 Type 2 diabetes mellitus with unspecified complications: Secondary | ICD-10-CM | POA: Diagnosis not present

## 2015-12-20 MED ORDER — SUVOREXANT 20 MG PO TABS: 20.0000 mg | ORAL_TABLET | Freq: Every day | ORAL | 5 refills | Status: DC

## 2015-12-20 NOTE — Assessment & Plan Note (Signed)
On skin lesions steroid injections

## 2015-12-20 NOTE — Progress Notes (Signed)
Pre visit review using our clinic review tool, if applicable. No additional management support is needed unless otherwise documented below in the visit note. 

## 2015-12-20 NOTE — Assessment & Plan Note (Signed)
[facility] 

## 2015-12-20 NOTE — Assessment & Plan Note (Signed)
Labs Metformin 

## 2015-12-20 NOTE — Progress Notes (Signed)
Subjective:  Patient ID: [name], female    DOB: 1960/09/21  Age: 55 y.o. MRN: [id number]  CC: No chief complaint on file.   HPI ASTA STEVENSON presents for sarcoidosis, obesity, chronic pain and insomnia f/u  Outpatient Medications Prior to Visit  Medication Sig Dispense Refill  . albuterol (PROVENTIL HFA;VENTOLIN HFA) 108 (90 Base) MCG/ACT inhaler Inhale 2 puffs into the lungs every 6 (six) hours as needed for wheezing or shortness of breath. 1 Inhaler 3  . amoxicillin (AMOXIL) 500 MG capsule Take for prior to dental procdures    . Blood Glucose Monitoring Suppl (FREESTYLE LITE) DEVI Use to check blood sugar 2 times per day 1 each 0  . carvedilol (COREG) 12.5 MG tablet Take 1 tablet (12.5 mg total) by mouth 2 (two) times daily with a meal. 180 tablet 3  . chlorhexidine (PERIDEX) 0.12 % solution Use as directed 5 mLs in the mouth or throat 2 (two) times daily as needed (thrush).   0  . clobetasol (TEMOVATE) 0.05 % external solution Apply 1 application topically 2 (two) times daily.  3  . docusate sodium (COLACE) 100 MG capsule Take 100 mg by mouth 2 (two) times daily.     . ergocalciferol (VITAMIN D2) 50000 units capsule Take 1 capsule (50,000 Units total) by mouth every 30 (thirty) days. 3 capsule 3  . escitalopram (LEXAPRO) 20 MG tablet TAKE 1 TABLET BY MOUTH DAILY 90 tablet 3  . esomeprazole (NEXIUM) 40 MG capsule TAKE 1 CAPSULE BY MOUTH EVERY DAY BEFORE BREAKFAST 90 capsule 3  . furosemide (LASIX) 20 MG tablet Take 1 tablet (20 mg total) by mouth daily. (Patient taking differently: Take 20 mg by mouth daily as needed for fluid or edema. ) 30 tablet 3  . gabapentin (NEURONTIN) 300 MG capsule Take 300 mg by mouth 2 (two) times daily.    [name] glucose blood (FREESTYLE LITE) test strip TEST TWICE DAILY 100 each 3  . KLOR-CON 10 10 MEQ tablet TAKE 1 TABLET BY MOUTH DAILY 90 tablet 3  . KLOR-CON 10 10 MEQ tablet TAKE 1 TABLET BY MOUTH DAILY 90 tablet 0  . Lancets  (FREESTYLE) lancets 1 each by Other route 2 (two) times daily. Use as instructed 100 each 12  . metFORMIN (GLUCOPHAGE) 500 MG tablet Take 1,000 mg by mouth daily.    . nortriptyline (PAMELOR) 75 MG capsule Take 1 capsule (75 mg total) by mouth at bedtime. 30 capsule 5  . omeprazole (PRILOSEC) 20 MG capsule Take 20 mg by mouth daily.    [name] oxyCODONE (OXY IR/ROXICODONE) 5 MG immediate release tablet Take 1-2 tablets (5-10 mg total) by mouth every 3 (three) hours as needed for breakthrough pain. 60 tablet 0  . polyethylene glycol (MIRALAX / GLYCOLAX) packet Take 17 g by mouth daily.    . predniSONE (DELTASONE) 5 MG tablet 2 daily x 2 wks, 1 daily x 2 wks then stop 42 tablet 0  . rivaroxaban (XARELTO) 20 MG TABS tablet Take 1 tablet (20 mg total) by mouth daily with supper. 30 tablet 5  . Suvorexant (BELSOMRA) 20 MG TABS Take 20 mg by mouth at bedtime. 30 tablet 5  . topiramate (TOPAMAX) 50 MG tablet TAKE 1 TABLET BY MOUTH TWICE DAILY 180 tablet 0   No facility-administered medications prior to visit.     ROS Review of Systems  Constitutional: Positive for fatigue and unexpected weight change. Negative for activity change, appetite change and chills.  HENT: Negative for  congestion, mouth sores and sinus pressure.   Eyes: Negative for visual disturbance.  Respiratory: Positive for chest tightness and shortness of breath. Negative for cough.   Cardiovascular: Positive for leg swelling.  Gastrointestinal: Negative for abdominal pain and nausea.  Genitourinary: Negative for difficulty urinating, frequency and vaginal pain.  Musculoskeletal: Positive for arthralgias and back pain. Negative for gait problem.  Skin: Positive for color change and rash. Negative for pallor.  Neurological: Positive for weakness. Negative for dizziness, tremors, numbness and headaches.  Psychiatric/Behavioral: Negative for confusion, sleep disturbance and suicidal ideas. The patient is nervous/anxious.     Objective:    BP 120/74   Pulse 73   Wt 271 lb (122.9 kg)   SpO2 95%   BMI 42.44 kg/m   BP Readings from Last 3 Encounters:  12/20/15 120/74  09/12/15 98/64  09/05/15 (!) 110/54    Wt Readings from Last 3 Encounters:  12/20/15 271 lb (122.9 kg)  09/12/15 252 lb 3.2 oz (114.4 kg)  09/05/15 254 lb (115.2 kg)    Physical Exam  Constitutional: She appears well-developed. No distress.  HENT:  Head: Normocephalic.  Right Ear: External ear normal.  Left Ear: External ear normal.  Nose: Nose normal.  Mouth/Throat: Oropharynx is clear and moist.  Eyes: Conjunctivae are normal. Pupils are equal, round, and reactive to light. Right eye exhibits no discharge. Left eye exhibits no discharge.  Neck: Normal range of motion. Neck supple. No JVD present. No tracheal deviation present. No thyromegaly present.  Cardiovascular: Normal rate, regular rhythm and normal heart sounds.   Pulmonary/Chest: No stridor. No respiratory distress. She has no wheezes.  Abdominal: Soft. Bowel sounds are normal. She exhibits no distension and no mass. There is no tenderness. There is no rebound and no guarding.  Musculoskeletal: She exhibits tenderness. She exhibits no edema.  Lymphadenopathy:    She has no cervical adenopathy.  Neurological: She displays normal reflexes. No cranial nerve deficit. She exhibits normal muscle tone. Coordination abnormal.  Skin: Rash noted. There is erythema.  Psychiatric: She has a normal mood and affect. Her behavior is normal. Judgment and thought content normal.  Obese  Lab Results  Component Value Date   WBC 7.0 08/19/2015   HGB 12.4 08/19/2015   HCT 38.7 08/19/2015   PLT 192 08/19/2015   GLUCOSE 105 (H) 08/18/2015   CHOL 193 06/29/2008   TRIG 145.0 06/29/2008   HDL 67.50 06/29/2008   LDLCALC 97 06/29/2008   ALT 21 07/19/2015   AST 21 07/19/2015   NA 142 08/18/2015   K 3.5 08/18/2015   CL 109 08/18/2015   CREATININE 1.04 (H) 08/18/2015   BUN 19 08/18/2015   CO2 27  08/18/2015   TSH 1.82 06/08/2015   INR 0.93 08/17/2015   HGBA1C 5.8 (H) 07/04/2015   MICROALBUR <0.7 02/01/2015    Mm Digital Screening Bilateral  Result Date: 11/02/2015 CLINICAL DATA:  Screening. EXAM: DIGITAL SCREENING BILATERAL MAMMOGRAM WITH CAD COMPARISON:  Previous exam(s). ACR Breast Density Category b: There are scattered areas of fibroglandular density. FINDINGS: There are no findings suspicious for malignancy. Images were processed with CAD. IMPRESSION: No mammographic evidence of malignancy. A result letter of this screening mammogram will be mailed directly to the patient. RECOMMENDATION: Screening mammogram in one year. (Code:SM-B-01Y) BI-RADS CATEGORY  1: Negative. Electronically Signed   By: Dorise Bullion III M.D   On: 11/02/2015 07:47    Assessment & Plan:   There are no diagnoses linked to this encounter. I am  having Ms. Craine maintain her clobetasol, glucose blood, freestyle, FREESTYLE LITE, KLOR-CON 10, carvedilol, albuterol, chlorhexidine, Suvorexant, ergocalciferol, furosemide, nortriptyline, gabapentin, escitalopram, oxyCODONE, metFORMIN, docusate sodium, polyethylene glycol, omeprazole, amoxicillin, rivaroxaban, esomeprazole, predniSONE, topiramate, KLOR-CON 10, methotrexate, fluticasone, betamethasone dipropionate, folic acid, and mupirocin ointment.  Meds ordered this encounter  Medications  . methotrexate (RHEUMATREX) 2.5 MG tablet    Sig: Take 3 tablets by mouth 2 (two) times a week.  . fluticasone (CUTIVATE) 0.005 % ointment    Sig: as needed.    Refill:  0  . betamethasone dipropionate 0.05 % lotion    Sig: 2 (two) times daily.    Refill:  1  . folic acid (FOLVITE) 1 MG tablet    Sig: Take 1 tablet by mouth daily.    Refill:  3  . mupirocin ointment (BACTROBAN) 2 %    Sig: 2 (two) times daily.    Refill:  0     Follow-up: No Follow-up on file.  Walker Kehr, MD

## 2015-12-20 NOTE — Assessment & Plan Note (Signed)
Labs Improve diet

## 2015-12-21 NOTE — Telephone Encounter (Signed)
Opened in error

## 2015-12-22 ENCOUNTER — Other Ambulatory Visit: Payer: Self-pay | Admitting: Endocrinology

## 2015-12-22 ENCOUNTER — Telehealth: Payer: Self-pay | Admitting: Endocrinology

## 2015-12-22 MED ORDER — METFORMIN HCL 500 MG PO TABS: 1000.0000 mg | ORAL_TABLET | Freq: Every day | ORAL | 2 refills | Status: DC

## 2015-12-22 NOTE — Telephone Encounter (Signed)
Refill submitted per patient's request.  

## 2015-12-22 NOTE — Telephone Encounter (Signed)
Patient need refill of  metFORMIN (GLUCOPHAGE) 500 MG tablet  [facility] [place], [place] [phone] (Phone) [phone] (Fax)

## 2016-01-02 ENCOUNTER — Telehealth: Payer: Self-pay | Admitting: *Deleted

## 2016-01-02 NOTE — Telephone Encounter (Signed)
-----   Message from Warden Fillers, RN sent at 01/01/2016  7:59 AM EST ----- Regarding: FW: belsomra 20 mg tablets Tori Cupps,  Please re-fill this.  Thanks! ----- Message ----- From: Cassandria Anger, MD Sent: 12/31/2015   6:54 PM To: Warden Fillers, RN Subject: Melton Alar: belsomra 20 mg tablets                     OK to fill this prescription with additional refills x5 Thank you!  ----- Message ----- From: Warden Fillers, RN Sent: 12/19/2015   3:07 PM To: Cassandria Anger, MD Subject: belsomra 20 mg tablets                         Patient is requesting Belsomra 20 mg tablets.  Last filled in May with 5 re-fills.  Patient is currently at [facility] place for rehab following a knee replacement.  Please advise.

## 2016-01-02 NOTE — Telephone Encounter (Signed)
Rf phoned in. See meds.  

## 2016-01-12 ENCOUNTER — Ambulatory Visit: Payer: Medicare Other | Admitting: Pulmonary Disease

## 2016-01-18 DIAGNOSIS — Z79899 Other long term (current) drug therapy: Secondary | ICD-10-CM | POA: Diagnosis not present

## 2016-01-18 DIAGNOSIS — D863 Sarcoidosis of skin: Secondary | ICD-10-CM | POA: Diagnosis not present

## 2016-01-22 ENCOUNTER — Ambulatory Visit: Payer: Medicare Other | Admitting: Adult Health

## 2016-01-23 ENCOUNTER — Other Ambulatory Visit: Payer: Self-pay | Admitting: *Deleted

## 2016-01-23 NOTE — Telephone Encounter (Signed)
Ok to Rf Nortriptyline 75 mg?

## 2016-01-24 MED ORDER — NORTRIPTYLINE HCL 75 MG PO CAPS: 75.0000 mg | ORAL_CAPSULE | Freq: Every day | ORAL | 11 refills | Status: DC

## 2016-01-30 ENCOUNTER — Ambulatory Visit (INDEPENDENT_AMBULATORY_CARE_PROVIDER_SITE_OTHER)
Admission: RE | Admit: 2016-01-30 | Discharge: 2016-01-30 | Disposition: A | Discharge status: Home / Self Care | Payer: Medicare Other | Source: Ambulatory Visit | Attending: Adult Health | Admitting: Adult Health

## 2016-01-30 ENCOUNTER — Ambulatory Visit (INDEPENDENT_AMBULATORY_CARE_PROVIDER_SITE_OTHER): Payer: Medicare Other | Admitting: Adult Health

## 2016-01-30 ENCOUNTER — Other Ambulatory Visit (INDEPENDENT_AMBULATORY_CARE_PROVIDER_SITE_OTHER): Payer: Medicare Other

## 2016-01-30 ENCOUNTER — Encounter: Payer: Self-pay | Admitting: Adult Health

## 2016-01-30 VITALS — BP 112/70 | HR 85 | Temp 97.9°F | Ht 67.0 in | Wt 270.8 lb

## 2016-01-30 DIAGNOSIS — I10 Essential (primary) hypertension: Secondary | ICD-10-CM

## 2016-01-30 DIAGNOSIS — I2699 Other pulmonary embolism without acute cor pulmonale: Secondary | ICD-10-CM

## 2016-01-30 DIAGNOSIS — D869 Sarcoidosis, unspecified: Secondary | ICD-10-CM | POA: Diagnosis not present

## 2016-01-30 DIAGNOSIS — J208 Acute bronchitis due to other specified organisms: Secondary | ICD-10-CM | POA: Diagnosis not present

## 2016-01-30 DIAGNOSIS — J209 Acute bronchitis, unspecified: Secondary | ICD-10-CM | POA: Insufficient documentation

## 2016-01-30 DIAGNOSIS — R05 Cough: Secondary | ICD-10-CM | POA: Diagnosis not present

## 2016-01-30 DIAGNOSIS — E118 Type 2 diabetes mellitus with unspecified complications: Secondary | ICD-10-CM

## 2016-01-30 DIAGNOSIS — R635 Abnormal weight gain: Secondary | ICD-10-CM

## 2016-01-30 LAB — HEPATIC FUNCTION PANEL
ALT: 88 U/L — AB (ref 0–35)
AST: 67 U/L — AB (ref 0–37)
Albumin: 3.7 g/dL (ref 3.5–5.2)
Alkaline Phosphatase: 75 U/L (ref 39–117)
BILIRUBIN DIRECT: 0.1 mg/dL (ref 0.0–0.3)
BILIRUBIN TOTAL: 0.4 mg/dL (ref 0.2–1.2)
Total Protein: 7.1 g/dL (ref 6.0–8.3)

## 2016-01-30 LAB — CBC WITH DIFFERENTIAL/PLATELET
BASOS PCT: 0.4 % (ref 0.0–3.0)
Basophils Absolute: 0 10*3/uL (ref 0.0–0.1)
Eosinophils Absolute: 0.1 10*3/uL (ref 0.0–0.7)
Eosinophils Relative: 2.2 % (ref 0.0–5.0)
HCT: 37.1 % (ref 36.0–46.0)
Hemoglobin: 12.5 g/dL (ref 12.0–15.0)
Lymphocytes Relative: 29.1 % (ref 12.0–46.0)
Lymphs Abs: 1.9 10*3/uL (ref 0.7–4.0)
MCHC: 33.7 g/dL (ref 30.0–36.0)
MCV: 96.9 fl (ref 78.0–100.0)
MONO ABS: 0.2 10*3/uL (ref 0.1–1.0)
Monocytes Relative: 3.5 % (ref 3.0–12.0)
NEUTROS ABS: 4.1 10*3/uL (ref 1.4–7.7)
Neutrophils Relative %: 64.8 % (ref 43.0–77.0)
PLATELETS: 291 10*3/uL (ref 150.0–400.0)
RBC: 3.83 Mil/uL — ABNORMAL LOW (ref 3.87–5.11)
RDW: 15.7 % — ABNORMAL HIGH (ref 11.5–15.5)
WBC: 6.4 10*3/uL (ref 4.0–10.5)

## 2016-01-30 LAB — TSH: TSH: 1.77 u[IU]/mL (ref 0.35–4.50)

## 2016-01-30 LAB — BASIC METABOLIC PANEL
BUN: 21 mg/dL (ref 6–23)
CALCIUM: 9.4 mg/dL (ref 8.4–10.5)
CO2: 25 meq/L (ref 19–32)
Chloride: 104 mEq/L (ref 96–112)
Creatinine, Ser: 1.04 mg/dL (ref 0.40–1.20)
GFR: 58.36 mL/min — ABNORMAL LOW (ref >60.00)
GLUCOSE: 111 mg/dL — AB (ref 70–99)
POTASSIUM: 4.1 meq/L (ref 3.5–5.1)
SODIUM: 139 meq/L (ref 135–145)

## 2016-01-30 LAB — HEMOGLOBIN A1C: Hgb A1c MFr Bld: 5.9 % (ref 4.6–6.5)

## 2016-01-30 MED ORDER — DOXYCYCLINE HYCLATE 100 MG PO TABS: 100.0000 mg | ORAL_TABLET | Freq: Two times a day (BID) | ORAL | 0 refills | Status: DC

## 2016-01-30 MED ORDER — ALBUTEROL SULFATE HFA 108 (90 BASE) MCG/ACT IN AERS: 2.0000 | INHALATION_SPRAY | Freq: Four times a day (QID) | RESPIRATORY_TRACT | 3 refills | Status: DC | PRN

## 2016-01-30 MED ORDER — PREDNISONE 10 MG PO TABS: ORAL_TABLET | ORAL | 0 refills | Status: DC

## 2016-01-30 NOTE — Patient Instructions (Addendum)
Doxycycline 100mg  Twice daily  For 1 week .  Prednisone 20mg  daily for 1 week then 10mg  daily for 1 week and 1/2 daily for 1 week stop .  Mucinex DM Twice daily  As needed  Cough/congestion  Chest xray today  Follow up with Dermatology as planned  Continue on Xarelto .  Follow up Dr. Elsworth Soho  In 4-6 weeks and As needed   Please contact office for sooner follow up if symptoms do not improve or worsen or seek emergency care

## 2016-01-30 NOTE — Progress Notes (Signed)
@Patient  ID: [name], female    DOB: [birth year], 56 y.o.   MRN: [id number]  Chief Complaint  Patient presents with  . Follow-up    Sarcoid    Referring provider: Cassandria Anger, MD  HPI: 56 year old female followed for pulmonary sarcoidosis, lung nodules and provoked(surgery)  PE without, right heart strain June 2017  TEST [facility]  PFT 05/2014 >> FVC 65%, ratio 69, FEV1 58%, DLCO 62%  VQ Scan 08/17/15 >>LLL &RUL acute PE   ECHO 08/18/15 >>mild LVH , EF 55-60%, Gr 1 DD .   LE Venous Doppler 08/18/15 >>neg DVT , no bakers cyst, ?prominent inguinal lymph node measuring 3.1cm   01/30/2016 Follow up : Sarocid , PE  Pt returns for 5 month follow up .  Complains of 2 weeks of cough, congestion , sob/doe. No otc used. Has rattling cough but hard to get anything up.  No fever, discolored mucus , chest pain, edema or n/v/d.   On Methotrexate . More sarcoid skin lesions in scap and face.  More active lately and has been referred to new Dermatologist.   Doing well on Xarelto . No significant bleeding. Occasional nosebleeds but stops on own.  She is coming up on her 6 months of therapy in few weeks. Will address at follow up about stopping therapy.   Allergies  Allergen Reactions  . Enalapril Maleate     REACTION: cough  . Hydrochlorothiazide     REACTION: Low potassium  . Hydroxychloroquine Sulfate     REACTION: vision changes  . Iodine     Mother was intolerant--CODED on CT table---pt never tired.  . Latex     Hives  . Nickel Other (See Comments)    Pt has a titanium right knee - nickel causes skin irritations that forms into blisters and sores    Immunization History  Administered Date(s) Administered  . H1N1 12/24/2007  . Influenza Split 10/11/2010  . Influenza Whole 10/14/2007, 10/05/2008, 10/26/2009, 11/13/2011  . Influenza,inj,Quad PF,36+ Mos 09/21/2012, 09/24/2013, 10/06/2014, 09/05/2015  . PPD Test 11/18/2014  . Pneumococcal Conjugate-13  07/30/2010, 12/29/2012  . Pneumococcal Polysaccharide-23 12/23/2013  . Tdap 06/28/2014    Past Medical History:  Diagnosis Date  . Allergic rhinitis   . ALLERGIC RHINITIS 10/26/2009  . Anxiety   . ANXIETY 08/14/2006  . B12 DEFICIENCY 08/25/2007  . Confusion   . Depression    takes Lexapro daily  . Diabetes mellitus without complication ([place])    was on insulin but has been off since Nov 2015 and now only takes Metformin daily  . DYSPNEA 04/28/2009   with exertion  . Esophageal reflux    takes Nexium daily  . Fibromyalgia   . Headache    last migraine 2-66yrs ago;takes Topamax daily  . History of shingles   . Hypertension    takes Coreg daily  . Insomnia    takes Nortriptyline nightly   . Joint pain   . Joint swelling   . Left knee pain   . Lichen planus   . Long-term memory loss   . Nocturia   . OSA (obstructive sleep apnea)    doesn't use CPAP;sleep study in epic from 2006  . Osteoarthritis   . Osteoarthritis   . Pneumonia    over 30 yrs ago  . Protein calorie malnutrition ([place])   . Rheumatoid arthritis ([place])   . Sarcoidosis ([place])    Dr. Lolita Patella  . Short-term memory loss   . Shortness of breath   .  TIA (transient ischemic attack)   . Unsteady gait   . Urinary urgency   . Vitamin D deficiency    is supposed to take Vit D but can't afford it  . VITAMIN D DEFICIENCY 08/25/2007    Tobacco History: History  Smoking Status  . Former Smoker  . Packs/day: 0.50  . Years: 10.00  . Types: Cigarettes  Smokeless Tobacco  . Never Used    Comment: quit smoking in 2004   Counseling given: Not Answered   Outpatient Encounter Prescriptions as of 01/30/2016  Medication Sig  . albuterol (PROVENTIL HFA;VENTOLIN HFA) 108 (90 Base) MCG/ACT inhaler Inhale 2 puffs into the lungs every 6 (six) hours as needed for wheezing or shortness of breath.  [name] amoxicillin (AMOXIL) 500 MG capsule Take for prior to dental procdures  . Blood Glucose Monitoring Suppl (FREESTYLE LITE) DEVI  Use to check blood sugar 2 times per day  . carvedilol (COREG) 12.5 MG tablet Take 1 tablet (12.5 mg total) by mouth 2 (two) times daily with a meal.  . chlorhexidine (PERIDEX) 0.12 % solution Use as directed 5 mLs in the mouth or throat 2 (two) times daily as needed (thrush).   . ergocalciferol (VITAMIN D2) 50000 units capsule Take 1 capsule (50,000 Units total) by mouth every 30 (thirty) days.  [name] escitalopram (LEXAPRO) 20 MG tablet TAKE 1 TABLET BY MOUTH DAILY  . fluticasone (CUTIVATE) 0.005 % ointment as needed.  . folic acid (FOLVITE) 1 MG tablet Take 1 tablet by mouth daily.  . furosemide (LASIX) 20 MG tablet Take 1 tablet (20 mg total) by mouth daily. (Patient taking differently: Take 20 mg by mouth daily as needed for fluid or edema. )  . glucose blood (FREESTYLE LITE) test strip TEST TWICE DAILY  . KLOR-CON 10 10 MEQ tablet TAKE 1 TABLET BY MOUTH DAILY  . Lancets (FREESTYLE) lancets 1 each by Other route 2 (two) times daily. Use as instructed  . metFORMIN (GLUCOPHAGE) 500 MG tablet TAKE 2 TABLETS(1000 MG) BY MOUTH DAILY WITH FOOD  . methotrexate (RHEUMATREX) 2.5 MG tablet Take 4 tablets by mouth 2 (two) times a week.   . nortriptyline (PAMELOR) 75 MG capsule Take 1 capsule (75 mg total) by mouth at bedtime.  [name] omeprazole (PRILOSEC) 20 MG capsule Take 20 mg by mouth daily.  . rivaroxaban (XARELTO) 20 MG TABS tablet Take 1 tablet (20 mg total) by mouth daily with supper.  . Suvorexant (BELSOMRA) 20 MG TABS Take 20 mg by mouth at bedtime.  . topiramate (TOPAMAX) 50 MG tablet TAKE 1 TABLET BY MOUTH TWICE DAILY  . [DISCONTINUED] albuterol (PROVENTIL HFA;VENTOLIN HFA) 108 (90 Base) MCG/ACT inhaler Inhale 2 puffs into the lungs every 6 (six) hours as needed for wheezing or shortness of breath.  . betamethasone dipropionate 0.05 % lotion 2 (two) times daily.  . clobetasol (TEMOVATE) 0.05 % external solution Apply 1 application topically 2 (two) times daily.  [name] docusate sodium (COLACE) 100 MG  capsule Take 100 mg by mouth 2 (two) times daily.   [name] doxycycline (VIBRA-TABS) 100 MG tablet Take 1 tablet (100 mg total) by mouth 2 (two) times daily.  [name] gabapentin (NEURONTIN) 300 MG capsule Take 300 mg by mouth 2 (two) times daily.  . mupirocin ointment (BACTROBAN) 2 % 2 (two) times daily.  [name] oxyCODONE (OXY IR/ROXICODONE) 5 MG immediate release tablet Take 1-2 tablets (5-10 mg total) by mouth every 3 (three) hours as needed for breakthrough pain. (Patient not taking: Reported on 01/30/2016)  .  polyethylene glycol (MIRALAX / GLYCOLAX) packet Take 17 g by mouth daily.  . predniSONE (DELTASONE) 10 MG tablet 2  Tabs daily for 1 week,  then 1 daily for 1 week , then 1/2 daily for 1 week and stop  . [DISCONTINUED] esomeprazole (NEXIUM) 40 MG capsule TAKE 1 CAPSULE BY MOUTH EVERY DAY BEFORE BREAKFAST  . [DISCONTINUED] KLOR-CON 10 10 MEQ tablet TAKE 1 TABLET BY MOUTH DAILY  . [DISCONTINUED] predniSONE (DELTASONE) 5 MG tablet 2 daily x 2 wks, 1 daily x 2 wks then stop   No facility-administered encounter medications on file as of 01/30/2016.      Review of Systems  Constitutional:   No  weight loss, night sweats,  Fevers, chills,  +fatigue, or  lassitude.  HEENT:   No headaches,  Difficulty swallowing,  Tooth/dental problems, or  Sore throat,                No sneezing, itching, ear ache,  +nasal congestion, post nasal drip,   CV:  No chest pain,  Orthopnea, PND, swelling in lower extremities, anasarca, dizziness, palpitations, syncope.   GI  No heartburn, indigestion, abdominal pain, nausea, vomiting, diarrhea, change in bowel habits, loss of appetite, bloody stools.   Resp: .  No chest wall deformity  Skin: no rash or lesions.  GU: no dysuria, change in color of urine, no urgency or frequency.  No flank pain, no hematuria   MS:  No joint pain or swelling.  No decreased range of motion.  No back pain.    Physical Exam  BP 112/70 (BP Location: Right Arm, Patient Position: Sitting,  Cuff Size: Normal)   Pulse 85   Temp 97.9 F (36.6 C) (Oral)   Ht 5\' 7"  (1.702 m)   Wt 270 lb 12.8 oz (122.8 kg)   SpO2 96%   BMI 42.41 kg/m   GEN: A/Ox3; pleasant , NAD, obese    HEENT:  [place]/AT,  EACs-clear, TMs-wnl, NOSE-clear, THROAT-clear, no lesions, no postnasal drip or exudate noted.   NECK:  Supple w/ fair ROM; no JVD; normal carotid impulses w/o bruits; no thyromegaly or nodules palpated; no lymphadenopathy.    RESP  Few trace rhonchi , . no accessory muscle use, no dullness to percussion  CARD:  RRR, no m/r/g, no peripheral edema, pulses intact, no cyanosis or clubbing.  GI:   Soft & nt; nml bowel sounds; no organomegaly or masses detected.   Musco: Warm bil, no deformities or joint swelling noted.   Neuro: alert, no focal deficits noted.    Skin: Warm, annular lesions along scalp and nasal folds.   Psych:  No change in mood or affect. No depression or anxiety.  No memory loss.  Lab Results:  CBC    Component Value Date/Time   WBC 7.0 08/19/2015 0512   RBC 4.11 08/19/2015 0512   HGB 12.4 08/19/2015 0512   HGB 13.4 12/06/2013 0930   HCT 38.7 08/19/2015 0512   HCT 43.0 12/06/2013 0930   PLT 192 08/19/2015 0512   PLT 216 12/06/2013 0930   MCV 94.2 08/19/2015 0512   MCV 92.1 12/06/2013 0930   MCH 30.2 08/19/2015 0512   MCHC 32.0 08/19/2015 0512   RDW 13.7 08/19/2015 0512   RDW 14.5 12/06/2013 0930   LYMPHSABS 1.9 08/17/2015 1814   LYMPHSABS 1.7 12/06/2013 0930   MONOABS 0.6 08/17/2015 1814   MONOABS 0.6 12/06/2013 0930   EOSABS 0.0 08/17/2015 1814   EOSABS 0.1 12/06/2013 0930   BASOSABS  0.0 08/17/2015 1814   BASOSABS 0.0 12/06/2013 0930    BMET    Component Value Date/Time   NA 142 08/18/2015 0354   NA 144 07/19/2015   NA 140 12/06/2013 0930   K 3.5 08/18/2015 0354   K 3.7 12/06/2013 0930   CL 109 08/18/2015 0354   CO2 27 08/18/2015 0354   CO2 24 12/06/2013 0930   GLUCOSE 105 (H) 08/18/2015 0354   GLUCOSE 110 12/06/2013 0930   BUN 19  08/18/2015 0354   BUN 12 07/19/2015   BUN 11.0 12/06/2013 0930   CREATININE 1.04 (H) 08/18/2015 0354   CREATININE 1.1 12/06/2013 0930   CALCIUM 8.9 08/18/2015 0354   CALCIUM 9.4 12/06/2013 0930   GFRNONAA 59 (L) 08/18/2015 0354   GFRAA >60 08/18/2015 0354    BNP No results found for: BNP  ProBNP    Component Value Date/Time   PROBNP 16.0 10/22/2006 1118    Imaging: No results found.   Assessment & Plan:   Sarcoidosis Flare with lung and skin involvement   Plan  Steroid taper over next 2 weeks.    Acute pulmonary embolism (HCC) Provoked PE after knee surgery in 08/2015 -plans for 6 month of tx  follow up 4 weeks and doing well will discuss d/c Xarelto  Acute bronchitis Acute bronchitis - Check cxr   Doxycyclnie and steroid taper  mucinex As needed   Please contact office for sooner follow up if symptoms do not improve or worsen or seek emergency care       Rexene Edison, NP 01/30/2016

## 2016-01-30 NOTE — Assessment & Plan Note (Addendum)
Acute bronchitis - Check cxr   Doxycyclnie and steroid taper  mucinex As needed   Please contact office for sooner follow up if symptoms do not improve or worsen or seek emergency care

## 2016-01-30 NOTE — Assessment & Plan Note (Signed)
Flare with lung and skin involvement   Plan  Steroid taper over next 2 weeks.

## 2016-01-30 NOTE — Assessment & Plan Note (Signed)
Provoked PE after knee surgery in 08/2015 -plans for 6 month of tx  follow up 4 weeks and doing well will discuss d/c Xarelto

## 2016-02-01 ENCOUNTER — Ambulatory Visit: Payer: Medicare Other | Admitting: Endocrinology

## 2016-02-04 NOTE — Progress Notes (Signed)
Reviewed & agree with plan  

## 2016-02-08 ENCOUNTER — Other Ambulatory Visit: Payer: Self-pay | Admitting: Internal Medicine

## 2016-02-08 DIAGNOSIS — Z79899 Other long term (current) drug therapy: Secondary | ICD-10-CM | POA: Diagnosis not present

## 2016-02-08 DIAGNOSIS — D863 Sarcoidosis of skin: Secondary | ICD-10-CM | POA: Diagnosis not present

## 2016-02-11 ENCOUNTER — Other Ambulatory Visit: Payer: Self-pay | Admitting: Internal Medicine

## 2016-02-12 ENCOUNTER — Encounter: Payer: Self-pay | Admitting: Endocrinology

## 2016-02-12 ENCOUNTER — Ambulatory Visit (INDEPENDENT_AMBULATORY_CARE_PROVIDER_SITE_OTHER): Payer: Medicare Other | Admitting: Endocrinology

## 2016-02-12 VITALS — BP 132/78 | HR 91 | Ht 67.0 in | Wt 284.0 lb

## 2016-02-12 DIAGNOSIS — E118 Type 2 diabetes mellitus with unspecified complications: Secondary | ICD-10-CM

## 2016-02-12 NOTE — Progress Notes (Signed)
Subjective:    Patient ID: [name], female    DOB: Feb 01, 1960, 56 y.o.   MRN: [id number]  HPI Pt returns for f/u of diabetes mellitus: DM type: 2 Dx'ed: 0000000 Complications: polyneuropathy. Therapy: metformin. GDM: never DKA: never Severe hypoglycemia: never Pancreatitis: never Other: she took insulin from 2013-2015; She intermittently takes prednisone, for sarcoidosis; she cannot undergo weight-loss surgery, as she has medicaid.   Interval history: She is still on prednisone. pt states she feels well in general, except for chronic sob.   Past Medical History:  Diagnosis Date  . Allergic rhinitis   . ALLERGIC RHINITIS 10/26/2009  . Anxiety   . ANXIETY 08/14/2006  . B12 DEFICIENCY 08/25/2007  . Confusion   . Depression    takes Lexapro daily  . Diabetes mellitus without complication ([place])    was on insulin but has been off since Nov 2015 and now only takes Metformin daily  . DYSPNEA 04/28/2009   with exertion  . Esophageal reflux    takes Nexium daily  . Fibromyalgia   . Headache    last migraine 2-24yrs ago;takes Topamax daily  . History of shingles   . Hypertension    takes Coreg daily  . Insomnia    takes Nortriptyline nightly   . Joint pain   . Joint swelling   . Left knee pain   . Lichen planus   . Long-term memory loss   . Nocturia   . OSA (obstructive sleep apnea)    doesn't use CPAP;sleep study in epic from 2006  . Osteoarthritis   . Osteoarthritis   . Pneumonia    over 30 yrs ago  . Protein calorie malnutrition ([place])   . Rheumatoid arthritis ([place])   . Sarcoidosis ([place])    Dr. Lolita Patella  . Short-term memory loss   . Shortness of breath   . TIA (transient ischemic attack)   . Unsteady gait   . Urinary urgency   . Vitamin D deficiency    is supposed to take Vit D but can't afford it  . VITAMIN D DEFICIENCY 08/25/2007    Past Surgical History:  Procedure Laterality Date  . APPENDECTOMY    . arthroscopic knee surgery Right 11-12-04  .  AXILLARY ABCESS IRRIGATION AND DEBRIDEMENT  Jul & Aug2012  . CARPAL TUNNEL RELEASE Left 05/23/2014   Procedure: CARPAL TUNNEL RELEASE;  Surgeon: Meredith Pel, MD;  Location: [facility];  Service: Orthopedics;  Laterality: Left;  . cyst removed from top of buttocks  at age 80  . ENDOMETRIAL ABLATION    . TOTAL KNEE ARTHROPLASTY Right 11/15/2014   Procedure: TOTAL RIGHT KNEE ARTHROPLASTY;  Surgeon: Meredith Pel, MD;  Location: [facility];  Service: Orthopedics;  Laterality: Right;  . TOTAL KNEE ARTHROPLASTY Left 07/13/2015   Procedure: LEFT TOTAL KNEE ARTHROPLASTY;  Surgeon: Meredith Pel, MD;  Location: [facility];  Service: Orthopedics;  Laterality: Left;    Social History   Social History  . Marital status: Single    Spouse name: N/A  . Number of children: N/A  . Years of education: N/A   Occupational History  . disabled    Social History Main Topics  . Smoking status: Former Smoker    Packs/day: 0.50    Years: 10.00    Types: Cigarettes  . Smokeless tobacco: Never Used     Comment: quit smoking in 2004  . Alcohol use No  . Drug use: No  . Sexual activity: Not Currently  Other Topics Concern  . Not on file   Social History Narrative   Single, broke up with partner in 2008.    Current Outpatient Prescriptions on File Prior to Visit  Medication Sig Dispense Refill  . albuterol (PROVENTIL HFA;VENTOLIN HFA) 108 (90 Base) MCG/ACT inhaler Inhale 2 puffs into the lungs every 6 (six) hours as needed for wheezing or shortness of breath. 1 Inhaler 3  . amoxicillin (AMOXIL) 500 MG capsule Take for prior to dental procdures    . betamethasone dipropionate 0.05 % lotion 2 (two) times daily.  1  . Blood Glucose Monitoring Suppl (FREESTYLE LITE) DEVI Use to check blood sugar 2 times per day 1 each 0  . carvedilol (COREG) 12.5 MG tablet TAKE 1 TABLET BY MOUTH TWICE DAILY WITH A MEAL 180 tablet 2  . chlorhexidine (PERIDEX) 0.12 % solution Use as directed 5 mLs in the mouth or throat 2  (two) times daily as needed (thrush).   0  . clobetasol (TEMOVATE) 0.05 % external solution Apply 1 application topically 2 (two) times daily.  3  . docusate sodium (COLACE) 100 MG capsule Take 100 mg by mouth 2 (two) times daily.     [name] doxycycline (VIBRA-TABS) 100 MG tablet Take 1 tablet (100 mg total) by mouth 2 (two) times daily. 14 tablet 0  . ergocalciferol (VITAMIN D2) 50000 units capsule Take 1 capsule (50,000 Units total) by mouth every 30 (thirty) days. 3 capsule 3  . escitalopram (LEXAPRO) 20 MG tablet TAKE 1 TABLET BY MOUTH DAILY 90 tablet 3  . fluticasone (CUTIVATE) 0.005 % ointment as needed.  0  . folic acid (FOLVITE) 1 MG tablet Take 1 tablet by mouth daily.  3  . furosemide (LASIX) 20 MG tablet Take 1 tablet (20 mg total) by mouth daily. (Patient taking differently: Take 20 mg by mouth daily as needed for fluid or edema. ) 30 tablet 3  . gabapentin (NEURONTIN) 300 MG capsule Take 300 mg by mouth 2 (two) times daily.    [name] glucose blood (FREESTYLE LITE) test strip TEST TWICE DAILY 100 each 3  . KLOR-CON 10 10 MEQ tablet TAKE 1 TABLET BY MOUTH DAILY 90 tablet 3  . Lancets (FREESTYLE) lancets 1 each by Other route 2 (two) times daily. Use as instructed 100 each 12  . metFORMIN (GLUCOPHAGE) 500 MG tablet TAKE 2 TABLETS(1000 MG) BY MOUTH DAILY WITH FOOD 180 tablet 2  . methotrexate (RHEUMATREX) 2.5 MG tablet Take 4 tablets by mouth 2 (two) times a week.     . mupirocin ointment (BACTROBAN) 2 % 2 (two) times daily.  0  . nortriptyline (PAMELOR) 75 MG capsule Take 1 capsule (75 mg total) by mouth at bedtime. 30 capsule 11  . omeprazole (PRILOSEC) 20 MG capsule Take 20 mg by mouth daily.    [name] oxyCODONE (OXY IR/ROXICODONE) 5 MG immediate release tablet Take 1-2 tablets (5-10 mg total) by mouth every 3 (three) hours as needed for breakthrough pain. 60 tablet 0  . polyethylene glycol (MIRALAX / GLYCOLAX) packet Take 17 g by mouth daily.    . predniSONE (DELTASONE) 10 MG tablet 2  Tabs daily  for 1 week,  then 1 daily for 1 week , then 1/2 daily for 1 week and stop 28 tablet 0  . rivaroxaban (XARELTO) 20 MG TABS tablet Take 1 tablet (20 mg total) by mouth daily with supper. 30 tablet 5  . Suvorexant (BELSOMRA) 20 MG TABS Take 20 mg by mouth at bedtime. [place]  tablet 5  . topiramate (TOPAMAX) 50 MG tablet TAKE 1 TABLET BY MOUTH TWICE DAILY 180 tablet 2  . KLOR-CON 10 10 MEQ tablet TAKE 1 TABLET BY MOUTH DAILY 90 tablet 3  . [DISCONTINUED] potassium chloride (K-DUR,KLOR-CON) 10 MEQ tablet TAKE 1 TABLET BY MOUTH DAILY 90 tablet 2   No current facility-administered medications on file prior to visit.     Allergies  Allergen Reactions  . Enalapril Maleate     REACTION: cough  . Hydrochlorothiazide     REACTION: Low potassium  . Hydroxychloroquine Sulfate     REACTION: vision changes  . Iodine     Mother was intolerant--CODED on CT table---pt never tired.  . Latex     Hives  . Nickel Other (See Comments)    Pt has a titanium right knee - nickel causes skin irritations that forms into blisters and sores    Family History  Problem Relation Age of Onset  . Heart disease Mother   . Kidney disease Mother   . Cancer Father     leukemia  . Hypertension    . Coronary artery disease      female 1st degree relative <60  . Heart failure      congestive  . Coronary artery disease      Female 1st degree relative <50  . Breast cancer      1st degree relative <50 S    BP 132/78   Pulse 91   Ht 5\' 7"  (1.702 m)   Wt 284 lb (128.8 kg)   SpO2 95%   BMI 44.48 kg/m    Review of Systems She has gained weight.     Objective:   Physical Exam VITAL SIGNS:  See vs page GENERAL: no distress Pulses: dorsalis pedis intact bilat.   MSK: no deformity of the feet CV: no leg edema Skin:  no ulcer on the feet.  normal color and temp on the feet.  Neuro: sensation is intact to touch on the feet, but slightly decreased from normal.     Lab Results  Component Value Date   HGBA1C 5.9  01/30/2016      Assessment & Plan:  Type 2 DM: well-controlled Obesity: much worse.  Patient is advised the following: Patient Instructions  Please continue the same metformin.   Please come back for a follow-up appointment in 6 months.   Please see a dietician specialist.  you will receive a phone call, about a day and time for an appointment check your blood sugar once a day.  vary the time of day when you check, between before the 3 meals, and at bedtime.  also check if you have symptoms of your blood sugar being too high or too low.  please keep a record of the readings and bring it to your next appointment here.  You can write it on any piece of paper.  please call us sooner if your blood sugar goes below 70, or if you have a lot of readings over 200.

## 2016-02-12 NOTE — Patient Instructions (Addendum)
Please continue the same metformin.   Please come back for a follow-up appointment in 6 months.   Please see a dietician specialist.  you will receive a phone call, about a day and time for an appointment check your blood sugar once a day.  vary the time of day when you check, between before the 3 meals, and at bedtime.  also check if you have symptoms of your blood sugar being too high or too low.  please keep a record of the readings and bring it to your next appointment here.  You can write it on any piece of paper.  please call us sooner if your blood sugar goes below 70, or if you have a lot of readings over 200.

## 2016-02-20 ENCOUNTER — Telehealth: Payer: Self-pay | Admitting: Pulmonary Disease

## 2016-02-20 MED ORDER — FLUCONAZOLE 150 MG PO TABS: 150.0000 mg | ORAL_TABLET | Freq: Once | ORAL | 0 refills | Status: AC

## 2016-02-20 MED ORDER — FLUCONAZOLE 150 MG PO TABS: 150.0000 mg | ORAL_TABLET | Freq: Every day | ORAL | 0 refills | Status: DC

## 2016-02-20 NOTE — Telephone Encounter (Signed)
Can have Diflucan 150mg  #1 x 1 dose  If not improving will need to see PCP /GYN  Please contact office for sooner follow up if symptoms do not improve or worsen or seek emergency care

## 2016-02-20 NOTE — Telephone Encounter (Signed)
Pt c/o yeast infection, feels that this came from the antibiotic Doxy 100mg . Pt states that she started the Doxy Rx a week late. Symptoms started about 1 week ago a vaginal infection and is now spreading to her torso, Pt states that the yeast infection is terrible and has spread to under her breast.  Please advise TP. Thanks.   01/30/16 Patient Instructions  Doxycycline 100mg  Twice daily  For 1 week .  Prednisone 20mg  daily for 1 week then 10mg  daily for 1 week and 1/2 daily for 1 week stop .  Mucinex DM Twice daily  As needed  Cough/congestion  Chest xray today  Follow up with Dermatology as planned  Continue on Xarelto .  Follow up Dr. Elsworth Soho  In 4-6 weeks and As needed   Please contact office for sooner follow up if symptoms do not improve or worsen or seek emergency care

## 2016-02-20 NOTE — Telephone Encounter (Signed)
Spoke with pt, aware of recs.  rx sent to preferred pharmacy.  Nothing further needed.  

## 2016-02-23 DIAGNOSIS — L0101 Non-bullous impetigo: Secondary | ICD-10-CM | POA: Diagnosis not present

## 2016-02-23 DIAGNOSIS — L304 Erythema intertrigo: Secondary | ICD-10-CM | POA: Diagnosis not present

## 2016-02-23 DIAGNOSIS — B370 Candidal stomatitis: Secondary | ICD-10-CM | POA: Diagnosis not present

## 2016-02-23 DIAGNOSIS — L0109 Other impetigo: Secondary | ICD-10-CM | POA: Diagnosis not present

## 2016-02-23 DIAGNOSIS — D863 Sarcoidosis of skin: Secondary | ICD-10-CM | POA: Diagnosis not present

## 2016-02-26 DIAGNOSIS — D863 Sarcoidosis of skin: Secondary | ICD-10-CM | POA: Diagnosis not present

## 2016-02-26 DIAGNOSIS — Z79899 Other long term (current) drug therapy: Secondary | ICD-10-CM | POA: Diagnosis not present

## 2016-03-06 ENCOUNTER — Telehealth: Payer: Self-pay | Admitting: Endocrinology

## 2016-03-06 NOTE — Telephone Encounter (Signed)
Could you mail her that paper with different drug store that would mail her diabetic supply's with no charge,   m  Mail to [place] [address] [address]

## 2016-03-06 NOTE — Telephone Encounter (Signed)
Letter mailed to the patient

## 2016-03-07 ENCOUNTER — Encounter: Payer: Medicare Other | Admitting: Dietician

## 2016-03-07 ENCOUNTER — Other Ambulatory Visit: Payer: Self-pay | Admitting: Adult Health

## 2016-03-08 DIAGNOSIS — L723 Sebaceous cyst: Secondary | ICD-10-CM | POA: Diagnosis not present

## 2016-03-08 DIAGNOSIS — D863 Sarcoidosis of skin: Secondary | ICD-10-CM | POA: Diagnosis not present

## 2016-03-08 DIAGNOSIS — L0109 Other impetigo: Secondary | ICD-10-CM | POA: Diagnosis not present

## 2016-03-18 ENCOUNTER — Ambulatory Visit: Payer: Medicare Other | Admitting: Pulmonary Disease

## 2016-03-19 NOTE — Progress Notes (Signed)
Pre visit review using our clinic review tool, if applicable. No additional management support is needed unless otherwise documented below in the visit note. 

## 2016-03-19 NOTE — Progress Notes (Signed)
   Subjective:  An AWV was not performed due to the patient being acutely ill.

## 2016-03-20 ENCOUNTER — Encounter: Payer: Self-pay | Admitting: Internal Medicine

## 2016-03-20 ENCOUNTER — Ambulatory Visit (INDEPENDENT_AMBULATORY_CARE_PROVIDER_SITE_OTHER): Payer: Medicare Other | Admitting: Internal Medicine

## 2016-03-20 DIAGNOSIS — E538 Deficiency of other specified B group vitamins: Secondary | ICD-10-CM

## 2016-03-20 DIAGNOSIS — D869 Sarcoidosis, unspecified: Secondary | ICD-10-CM | POA: Diagnosis not present

## 2016-03-20 DIAGNOSIS — E118 Type 2 diabetes mellitus with unspecified complications: Secondary | ICD-10-CM

## 2016-03-20 DIAGNOSIS — R21 Rash and other nonspecific skin eruption: Secondary | ICD-10-CM

## 2016-03-20 MED ORDER — METHYLPREDNISOLONE 4 MG PO TBPK: ORAL_TABLET | ORAL | 0 refills | Status: DC

## 2016-03-20 MED ORDER — MUPIROCIN 2 % EX OINT: TOPICAL_OINTMENT | Freq: Four times a day (QID) | CUTANEOUS | 0 refills | Status: DC

## 2016-03-20 NOTE — Assessment & Plan Note (Addendum)
crusty erosions on eryth base in B ears, B hares, one on the lower lip - impetigo vs allergic... Bactroban oint MTX was stopped 1 or 2 wks ago

## 2016-03-20 NOTE — Assessment & Plan Note (Signed)
On B12 

## 2016-03-20 NOTE — Progress Notes (Signed)
Subjective:  Patient ID: [name], female    DOB: 14-Jan-1961  Age: 56 y.o. MRN: [id number]  CC: Follow-up (sarcoidosis, dm type 2) and Fatigue (grandson had stomach virus, patient states she confused.)   HPI [name] presents for a severe rash and swelling in the ears, nares and on the lip x 2 weeks. She saw Dr Ubaldo Glassing and was given a steroid ointment - not better. She had Doxy - not a long time ago...  MTX was stopped 1 or 2 wks ago  Outpatient Medications Prior to Visit  Medication Sig Dispense Refill  . albuterol (PROVENTIL HFA;VENTOLIN HFA) 108 (90 Base) MCG/ACT inhaler Inhale 2 puffs into the lungs every 6 (six) hours as needed for wheezing or shortness of breath. 1 Inhaler 3  . betamethasone dipropionate 0.05 % lotion 2 (two) times daily.  1  . Blood Glucose Monitoring Suppl (FREESTYLE LITE) DEVI Use to check blood sugar 2 times per day 1 each 0  . carvedilol (COREG) 12.5 MG tablet TAKE 1 TABLET BY MOUTH TWICE DAILY WITH A MEAL 180 tablet 2  . chlorhexidine (PERIDEX) 0.12 % solution Use as directed 5 mLs in the mouth or throat 2 (two) times daily as needed (thrush).   0  . clobetasol (TEMOVATE) 0.05 % external solution Apply 1 application topically 2 (two) times daily.  3  . docusate sodium (COLACE) 100 MG capsule Take 100 mg by mouth 2 (two) times daily.     [name] doxycycline (VIBRA-TABS) 100 MG tablet Take 1 tablet (100 mg total) by mouth 2 (two) times daily. 14 tablet 0  . ergocalciferol (VITAMIN D2) 50000 units capsule Take 1 capsule (50,000 Units total) by mouth every 30 (thirty) days. 3 capsule 3  . escitalopram (LEXAPRO) 20 MG tablet TAKE 1 TABLET BY MOUTH DAILY 90 tablet 3  . fluconazole (DIFLUCAN) 150 MG tablet Take 1 tablet (150 mg total) by mouth daily. 1 tablet 0  . fluticasone (CUTIVATE) 0.005 % ointment as needed.  0  . folic acid (FOLVITE) 1 MG tablet Take 1 tablet by mouth daily.  3  . furosemide (LASIX) 20 MG tablet Take 1 tablet (20 mg total)  by mouth daily. (Patient taking differently: Take 20 mg by mouth daily as needed for fluid or edema. ) 30 tablet 3  . gabapentin (NEURONTIN) 300 MG capsule Take 300 mg by mouth 2 (two) times daily.    [name] glucose blood (FREESTYLE LITE) test strip TEST TWICE DAILY 100 each 3  . KLOR-CON 10 10 MEQ tablet TAKE 1 TABLET BY MOUTH DAILY 90 tablet 3  . Lancets (FREESTYLE) lancets 1 each by Other route 2 (two) times daily. Use as instructed 100 each 12  . metFORMIN (GLUCOPHAGE) 500 MG tablet TAKE 2 TABLETS(1000 MG) BY MOUTH DAILY WITH FOOD 180 tablet 2  . methotrexate (RHEUMATREX) 2.5 MG tablet Take 4 tablets by mouth 2 (two) times a week.     . mupirocin ointment (BACTROBAN) 2 % 2 (two) times daily.  0  . nortriptyline (PAMELOR) 75 MG capsule Take 1 capsule (75 mg total) by mouth at bedtime. 30 capsule 11  . omeprazole (PRILOSEC) 20 MG capsule Take 20 mg by mouth daily.    [name] oxyCODONE (OXY IR/ROXICODONE) 5 MG immediate release tablet Take 1-2 tablets (5-10 mg total) by mouth every 3 (three) hours as needed for breakthrough pain. 60 tablet 0  . polyethylene glycol (MIRALAX / GLYCOLAX) packet Take 17 g by mouth daily.    [name]  Suvorexant (BELSOMRA) 20 MG TABS Take 20 mg by mouth at bedtime. 30 tablet 5  . topiramate (TOPAMAX) 50 MG tablet TAKE 1 TABLET BY MOUTH TWICE DAILY 180 tablet 2  . XARELTO 20 MG TABS tablet TAKE 1 TABLET BY MOUTH EVERY DAY WITH SUPPER 30 tablet 0  . KLOR-CON 10 10 MEQ tablet TAKE 1 TABLET BY MOUTH DAILY 90 tablet 3  . predniSONE (DELTASONE) 10 MG tablet 2  Tabs daily for 1 week,  then 1 daily for 1 week , then 1/2 daily for 1 week and stop 28 tablet 0  . amoxicillin (AMOXIL) 500 MG capsule Take for prior to dental procdures     No facility-administered medications prior to visit.     ROS Review of Systems  Constitutional: Negative for activity change, appetite change, chills, fatigue, fever and unexpected weight change.  HENT: Negative for congestion, mouth sores and sinus pressure.    Eyes: Negative for visual disturbance.  Respiratory: Negative for cough and chest tightness.   Gastrointestinal: Negative for abdominal distention, abdominal pain, diarrhea and nausea.  Genitourinary: Negative for difficulty urinating, frequency and vaginal pain.  Musculoskeletal: Negative for back pain and gait problem.  Skin: Positive for rash and wound. Negative for pallor.  Neurological: Negative for dizziness, tremors, weakness, numbness and headaches.  Psychiatric/Behavioral: Negative for confusion and sleep disturbance.    Objective:  BP (!) 98/56   Pulse 82   Temp 98.7 F (37.1 C) (Oral)   Resp 14   Ht 5\' 7"  (1.702 m)   Wt 285 lb (129.3 kg)   SpO2 94%   BMI 44.64 kg/m   BP Readings from Last 3 Encounters:  03/20/16 (!) 98/56  02/12/16 132/78  01/30/16 112/70    Wt Readings from Last 3 Encounters:  03/20/16 285 lb (129.3 kg)  02/12/16 284 lb (128.8 kg)  01/30/16 270 lb 12.8 oz (122.8 kg)    Physical Exam  Constitutional: She appears well-developed. No distress.  HENT:  Head: Normocephalic.  Right Ear: External ear normal.  Left Ear: External ear normal.  Nose: Nose normal.  Mouth/Throat: Oropharynx is clear and moist.  Eyes: Conjunctivae are normal. Pupils are equal, round, and reactive to light. Right eye exhibits no discharge. Left eye exhibits no discharge.  Neck: Normal range of motion. Neck supple. No JVD present. No tracheal deviation present. No thyromegaly present.  Cardiovascular: Normal rate, regular rhythm and normal heart sounds.   Pulmonary/Chest: No stridor. No respiratory distress. She has no wheezes.  Abdominal: Soft. Bowel sounds are normal. She exhibits no distension and no mass. There is no tenderness. There is no rebound and no guarding.  Musculoskeletal: She exhibits no edema or tenderness.  Lymphadenopathy:    She has no cervical adenopathy.  Neurological: She displays normal reflexes. No cranial nerve deficit. She exhibits normal  muscle tone. Coordination normal.  Skin: Rash noted. There is erythema.  Psychiatric: She has a normal mood and affect. Her behavior is normal. Judgment and thought content normal.  crusty erosions on eryth base in B ears, B hares, one on the lower lip Obese  Lab Results  Component Value Date   WBC 6.4 01/30/2016   HGB 12.5 01/30/2016   HCT 37.1 01/30/2016   PLT 291.0 01/30/2016   GLUCOSE 111 (H) 01/30/2016   CHOL 193 06/29/2008   TRIG 145.0 06/29/2008   HDL 67.50 06/29/2008   LDLCALC 97 06/29/2008   ALT 88 (H) 01/30/2016   AST 67 (H) 01/30/2016   NA 139  01/30/2016   K 4.1 01/30/2016   CL 104 01/30/2016   CREATININE 1.04 01/30/2016   BUN 21 01/30/2016   CO2 25 01/30/2016   TSH 1.77 01/30/2016   INR 0.93 08/17/2015   HGBA1C 5.9 01/30/2016   MICROALBUR <0.7 02/01/2015    Dg Chest 2 View  Result Date: 01/30/2016 CLINICAL DATA:  Dry cough and dyspnea x3 weeks. Hypertension, diabetes, sarcoidosis and pulmonary embolus. EXAM: CHEST  2 VIEW COMPARISON:  08/17/2015 CXR, chest CT 02/02/2013 FINDINGS: Heart is top-normal in size. Stable aortic atherosclerosis without aneurysm. Mild hilar fullness bilaterally more so on the right consistent with history of sarcoid. Scarring in the right upper lobe. Slight increase in pulmonary interstitial prominence and slight peribronchial thickening suggests bronchitic change. No effusion or pneumothorax. No pneumonic consolidations. No suspicious osseous abnormality. IMPRESSION: Slight increase in interstitial prominence with slight peribronchial thickening suggesting bronchitic change. Mild hilar fullness consistent with history of sarcoid. Aortic atherosclerosis. Electronically Signed   By: Ashley Royalty M.D.   On: 01/30/2016 14:20    Assessment & Plan:   There are no diagnoses linked to this encounter. I have discontinued Ms. Franken's predniSONE. I am also having her maintain her clobetasol, glucose blood, freestyle, FREESTYLE LITE,  chlorhexidine, ergocalciferol, furosemide, gabapentin, escitalopram, oxyCODONE, docusate sodium, polyethylene glycol, omeprazole, amoxicillin, methotrexate, fluticasone, betamethasone dipropionate, folic acid, mupirocin ointment, Suvorexant, metFORMIN, nortriptyline, doxycycline, albuterol, carvedilol, topiramate, KLOR-CON 10, fluconazole, XARELTO, nystatin, and Mouthwashes (ANTISEPTIC MOUTH RINSE MT).  Meds ordered this encounter  Medications  . nystatin (MYCOSTATIN) 100000 UNIT/ML suspension  . Mouthwashes (ANTISEPTIC MOUTH RINSE MT)    Sig: RINSE WITH 10 ML AND THEN SPIT OUT. DO THIS QID PRN    Refill:  0     Follow-up: No Follow-up on file.  Walker Kehr, MD

## 2016-03-20 NOTE — Progress Notes (Signed)
Pre-visit discussion using our clinic review tool. No additional management support is needed unless otherwise documented below in the visit note.  

## 2016-03-20 NOTE — Assessment & Plan Note (Signed)
On Metformin 

## 2016-03-20 NOTE — Assessment & Plan Note (Signed)
Flare up --- will use dose-pack

## 2016-03-25 DIAGNOSIS — D869 Sarcoidosis, unspecified: Secondary | ICD-10-CM | POA: Diagnosis not present

## 2016-03-25 DIAGNOSIS — Z1159 Encounter for screening for other viral diseases: Secondary | ICD-10-CM | POA: Diagnosis not present

## 2016-03-25 DIAGNOSIS — Z79899 Other long term (current) drug therapy: Secondary | ICD-10-CM | POA: Diagnosis not present

## 2016-03-26 ENCOUNTER — Telehealth: Payer: Self-pay | Admitting: Pulmonary Disease

## 2016-03-26 NOTE — Telephone Encounter (Signed)
lmtcb x1 for Dr. Otho Ket.

## 2016-03-27 NOTE — Telephone Encounter (Signed)
Called and spoke to Dr. Otho Ket. He is requesting to start the pt on Humira. Dr. Otho Ket states he will prescribe the medication but wanted to be sure Dr. Elsworth Soho was aware and ok with the plan. Dr. Otho Ket states if RA would like to call him back he is available. Otherwise, nurse can call Dr. Otho Ket back to relay RA's recs.   Dr. Elsworth Soho please advise if you are ok with pt starting Humira.

## 2016-03-28 ENCOUNTER — Other Ambulatory Visit: Payer: Self-pay | Admitting: Pain Medicine

## 2016-03-28 DIAGNOSIS — M545 Low back pain: Secondary | ICD-10-CM | POA: Diagnosis not present

## 2016-03-28 DIAGNOSIS — M47817 Spondylosis without myelopathy or radiculopathy, lumbosacral region: Secondary | ICD-10-CM | POA: Diagnosis not present

## 2016-03-28 DIAGNOSIS — M546 Pain in thoracic spine: Secondary | ICD-10-CM

## 2016-03-28 DIAGNOSIS — G894 Chronic pain syndrome: Secondary | ICD-10-CM | POA: Diagnosis not present

## 2016-03-28 DIAGNOSIS — M549 Dorsalgia, unspecified: Secondary | ICD-10-CM | POA: Diagnosis not present

## 2016-03-29 NOTE — Telephone Encounter (Signed)
RA please advise. thanks 

## 2016-04-01 NOTE — Telephone Encounter (Signed)
Discussed-okay to proceed with treatment due to recurring cutaneous lesions, not responding to methotrexate They will obtain QuantiFERON and keep [place] updated as to progress

## 2016-04-04 ENCOUNTER — Telehealth: Payer: Self-pay | Admitting: Internal Medicine

## 2016-04-04 ENCOUNTER — Encounter: Payer: Medicare Other | Attending: Endocrinology | Admitting: Dietician

## 2016-04-04 DIAGNOSIS — E118 Type 2 diabetes mellitus with unspecified complications: Secondary | ICD-10-CM | POA: Diagnosis not present

## 2016-04-04 DIAGNOSIS — Z713 Dietary counseling and surveillance: Secondary | ICD-10-CM | POA: Insufficient documentation

## 2016-04-04 NOTE — Telephone Encounter (Signed)
OK. Thx

## 2016-04-04 NOTE — Progress Notes (Signed)
Diabetes Self-Management Education  Visit Type: First/Initial  Appt. Start Time: 1100 Appt. End Time: 1215  04/04/2016  [name], identified by name and date of birth, is a 56 y.o. female with a diagnosis of Diabetes: Type 2. Other hx includes sarcoidosis and requires steroids for this at times.  She currently has skin problems and mouth sores also from the Sarcoidosis.  She can not wear her dentures at this time.  She has difficulty sleeping and increased pain from fibromyalgia.  Hx also includes hx of vitamin B-12 deficiency and vitamin D deficiency.  She has depression related to her health but finds desire to live because of her 35 month old grandson.  Patient lives alone.  She does her own shopping.  She only gets $38 per moth for food stamps and has difficulty affording food.  ASSESSMENT  Height 5\' 7"  (1.702 m), weight 286 lb (129.7 kg). Body mass index is 44.79 kg/m.  Increased appetite and intake on steroids.  Her weight was >300 lbs in 2015.  Low of 119 lbs prior to the birth of her son.  She has been losing weight by being more conscience of her food choices.      Diabetes Self-Management Education - 04/04/16 1132      Visit Information   Visit Type First/Initial     Initial Visit   Diabetes Type Type 2   Are you currently following a meal plan? No   Are you taking your medications as prescribed? Yes   Date Diagnosed 2013     Health Coping   How would you rate your overall health? Poor     Psychosocial Assessment   Patient Belief/Attitude about Diabetes Motivated to manage diabetes   Self-care barriers Lack of material resources   Self-management support Doctor's office   Other persons present Patient   Patient Concerns Nutrition/Meal planning;Weight Control   Special Needs None   Preferred Learning Style No preference indicated   Learning Readiness Ready   How often do you need to have someone help you when you read instructions, pamphlets, or  other written materials from your doctor or pharmacy? 1 - Never   What is the last grade level you completed in school? 12th grade     Pre-Education Assessment   Patient understands the diabetes disease and treatment process. Needs Review   Patient understands incorporating nutritional management into lifestyle. Needs Review   Patient undertands incorporating physical activity into lifestyle. Needs Review   Patient understands using medications safely. Needs Review   Patient understands monitoring blood glucose, interpreting and using results Needs Review   Patient understands prevention, detection, and treatment of acute complications. Needs Review   Patient understands prevention, detection, and treatment of chronic complications. Needs Review   Patient understands how to develop strategies to address psychosocial issues. Needs Review   Patient understands how to develop strategies to promote health/change behavior. Needs Review     Complications   Last HgB A1C per patient/outside source 5.9 %  01/30/16   How often do you check your blood sugar? 0 times/day (not testing)   Have you had a dilated eye exam in the past 12 months? No   Have you had a dental exam in the past 12 months? Yes   Are you checking your feet? Yes   How many days per week are you checking your feet? 7     Dietary Intake   Breakfast Multigrain cheerios, 1-2% milk, coffee with milk and 2 tsp  sugar  10   Snack (morning) none   Lunch leftovers OR PB & Jelly on [employer]  3   Snack (afternoon) coffee with milk and 2 tsp sugar and sometimes a grilled cheese sandwich   Dinner chicken or pork chop, starch, vegetables  7-8   Snack (evening) sometimes leftovers   Beverage(s) coffee with milk and 2 tsp sugar, sprite zero, water     Exercise   Exercise Type Light (walking / raking leaves)  walks with her walker, stationary bike   How many days per week to you exercise? 1   How many minutes per day do you exercise? 60    Total minutes per week of exercise 60     Patient Education   Previous Diabetes Education Yes (please comment)  when first diagnosed   Nutrition management  Role of diet in the treatment of diabetes and the relationship between the three main macronutrients and blood glucose level;Food label reading, portion sizes and measuring food.;Meal options for control of blood glucose level and chronic complications.   Physical activity and exercise  Role of exercise on diabetes management, blood pressure control and cardiac health.;Identified with patient nutritional and/or medication changes necessary with exercise.   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 exams;Dental care   Psychosocial adjustment Worked with patient to identify barriers to care and solutions;Role of stress on diabetes;Brainstormed with patient on coping mechanisms for social situations, getting support from significant others, dealing with feelings about diabetes;Identified and addressed patients feelings and concerns about diabetes     Individualized Goals (developed by patient)   Nutrition General guidelines for healthy choices and portions discussed   Physical Activity Exercise 3-5 times per week;15 minutes per day   Medications take my medication as prescribed   Monitoring  test my blood glucose as discussed   Reducing Risk do foot checks daily   Health Coping discuss diabetes with (comment)  MD/RD     Post-Education Assessment   Patient understands the diabetes disease and treatment process. Needs Review   Patient understands incorporating nutritional management into lifestyle. Demonstrates understanding / competency   Patient undertands incorporating physical activity into lifestyle. Demonstrates understanding / competency   Patient understands using medications safely. Demonstrates understanding / competency   Patient understands monitoring blood glucose,  interpreting and using results Needs Review   Patient understands prevention, detection, and treatment of acute complications. Demonstrates understanding / competency   Patient understands prevention, detection, and treatment of chronic complications. Demonstrates understanding / competency   Patient understands how to develop strategies to address psychosocial issues. Demonstrates understanding / competency   Patient understands how to develop strategies to promote health/change behavior. Demonstrates understanding / competency     Outcomes   Expected Outcomes Demonstrated interest in learning. Expect positive outcomes   Future DMSE PRN   Program Status Completed      Individualized Plan for Diabetes Self-Management Training:   Learning Objective:  Patient will have a greater understanding of diabetes self-management. Patient education plan is to attend individual and/or group sessions per assessed needs and concerns.   Plan:   Patient Instructions  Plan:  Aim for 2-3 Carb Choices per meal (30-45 grams) +/- 1 either way  Aim for 0-1 Carbs per snack if hungry  Include protein in moderation with your meals and snacks Consider reading food labels for Total Carbohydrate and Fat Grams of foods Consider  increasing your activity level by stationary bike  for 15 minutes daily as tolerated. Consider checking BG at alternate times per day as directed by MD  Continue taking medication as directed by MD  Avoid added salt.  Choose soft foods.   Expected Outcomes:  Demonstrated interest in learning. Expect positive outcomes  Education material provided: Food label handouts, Meal plan card and My Plate  Freestyle Freedom lite meter Lot [id number] provided.  If problems or questions, patient to contact team via:  Phone and Email  Future DSME appointment: PRN

## 2016-04-04 NOTE — Patient Instructions (Signed)
Plan:  Aim for 2-3 Carb Choices per meal (30-45 grams) +/- 1 either way  Aim for 0-1 Carbs per snack if hungry  Include protein in moderation with your meals and snacks Consider reading food labels for Total Carbohydrate and Fat Grams of foods Consider  increasing your activity level by stationary bike for 15 minutes daily as tolerated. Consider checking BG at alternate times per day as directed by MD  Continue taking medication as directed by MD  Avoid added salt.  Choose soft foods.

## 2016-04-04 NOTE — Telephone Encounter (Signed)
Pt called and stated that her in side of her mouth is inflammed,and her ears are bothering her. Pt is requesting a refill of the 6 day steroid given to her earlier this month methylPREDNISolone. States it helped her. Please advise.

## 2016-04-05 NOTE — Telephone Encounter (Signed)
Notified pt MD sent med to [facility]...[name]

## 2016-04-06 ENCOUNTER — Other Ambulatory Visit: Payer: Self-pay | Admitting: Pulmonary Disease

## 2016-04-08 ENCOUNTER — Telehealth: Payer: Self-pay | Admitting: Internal Medicine

## 2016-04-08 MED ORDER — METHYLPREDNISOLONE 4 MG PO TBPK: ORAL_TABLET | ORAL | 0 refills | Status: DC

## 2016-04-08 NOTE — Telephone Encounter (Signed)
Routing to dr [employer], patient will check with either dermatology or ENT----she is not taking oxycodone and hasnt had any gabapentin in a while, could you call in gabapentin or something else to help with pain in her ear and mouth---please advise, I will call patient back, thanks

## 2016-04-08 NOTE — Telephone Encounter (Signed)
Routing to dr plotnilkov, please advise, thanks

## 2016-04-08 NOTE — Addendum Note (Signed)
Addended by: Earnstine Regal on: 04/08/2016 08:40 AM   Modules accepted: Orders

## 2016-04-08 NOTE — Telephone Encounter (Signed)
Resent Medrol dose pack to [facility]...[name]

## 2016-04-08 NOTE — Telephone Encounter (Signed)
Pt called and said that the pharmacy never received this medication.

## 2016-04-08 NOTE — Telephone Encounter (Signed)
Pt called and said that Dr Camila Li said that she had some sort of ulcers or boils in her ear that could be a staph infection. He told her to use Mupirocin but it does not seem to be working. Pt wants to make sure this is nothing contagious because she is going to visit her grandson soon. Please advise. Thanks [employer]

## 2016-04-08 NOTE — Telephone Encounter (Signed)
I can't tell. Pls f/u w/Dermatology Thx

## 2016-04-09 MED ORDER — GABAPENTIN 300 MG PO CAPS: 300.0000 mg | ORAL_CAPSULE | Freq: Two times a day (BID) | ORAL | 5 refills | Status: DC

## 2016-04-09 NOTE — Telephone Encounter (Signed)
OK Gabapentin Thx

## 2016-04-10 NOTE — Telephone Encounter (Signed)
Patient advised rx has been sent 

## 2016-04-12 ENCOUNTER — Other Ambulatory Visit: Payer: Self-pay | Admitting: Endocrinology

## 2016-04-24 ENCOUNTER — Ambulatory Visit (INDEPENDENT_AMBULATORY_CARE_PROVIDER_SITE_OTHER): Payer: Medicare Other | Admitting: Pulmonary Disease

## 2016-04-24 ENCOUNTER — Encounter: Payer: Self-pay | Admitting: Pulmonary Disease

## 2016-04-24 VITALS — BP 118/80 | HR 77 | Ht 67.0 in | Wt 290.0 lb

## 2016-04-24 DIAGNOSIS — I2699 Other pulmonary embolism without acute cor pulmonale: Secondary | ICD-10-CM

## 2016-04-24 DIAGNOSIS — D869 Sarcoidosis, unspecified: Secondary | ICD-10-CM | POA: Diagnosis not present

## 2016-04-24 MED ORDER — PREDNISONE 10 MG PO TABS: ORAL_TABLET | ORAL | 1 refills | Status: DC

## 2016-04-24 NOTE — Assessment & Plan Note (Signed)
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

## 2016-04-24 NOTE — Assessment & Plan Note (Signed)
Okay to stop taking Xarelto  Would suggest outpatient physical therapy

## 2016-04-24 NOTE — Addendum Note (Signed)
Addended by: Valerie Salts on: 04/24/2016 02:29 PM   Modules accepted: Orders

## 2016-04-24 NOTE — Patient Instructions (Signed)
We will schedule MRI brain with contrast. Start prednisone 10 mg and stay at this dose until seen again # 60 with 1 refill Check his sugars while on this dose of prednisone   Okay to stop taking Xarelto  Would suggest outpatient physical therapy

## 2016-04-24 NOTE — Progress Notes (Signed)
Subjective:    Patient ID: [name], female    DOB: 1960-04-18, 56 y.o.   MRN: [id number]  HPI  56 year old female followed for pulmonary & skin  sarcoidosis, lung nodules and provoked(surgery)  PE June 2017   03/2016 Discussed with dr Brynda Greathouse to proceed with humira due to recurring cutaneous lesions, not responding to methotrexate -Failed methotrexate  She has not been started on Humira yet, awaiting insurance approval. Meanwhile her skin lesions are worse and she reports itching of her scalp and ears and falling hair. She also reports multiple fall and poor balance, MRI of her back has been scheduled She denies dyspnea but occasionally coughs up green mucous plugs  Sugars are otherwise well controlled  Significant tests/ events  PFT 05/2014 >> FVC 65%, ratio 69, FEV1 58%, DLCO 62%  VQ Scan 08/17/15 >>LLL &RUL acute PE   ECHO 08/18/15 >>mild LVH , EF 55-60%, Gr 1 DD .   LE Venous Doppler 08/18/15 >>neg DVT , no bakers cyst, ?prominent inguinal lymph node measuring 3.1cm     Past Medical History:  Diagnosis Date  . Allergic rhinitis   . ALLERGIC RHINITIS 10/26/2009  . Anxiety   . ANXIETY 08/14/2006  . B12 DEFICIENCY 08/25/2007  . Confusion   . Depression    takes Lexapro daily  . Diabetes mellitus without complication ([place])    was on insulin but has been off since Nov 2015 and now only takes Metformin daily  . DYSPNEA 04/28/2009   with exertion  . Esophageal reflux    takes Nexium daily  . Fibromyalgia   . Headache    last migraine 2-75yrs ago;takes Topamax daily  . History of shingles   . Hypertension    takes Coreg daily  . Insomnia    takes Nortriptyline nightly   . Joint pain   . Joint swelling   . Left knee pain   . Lichen planus   . Long-term memory loss   . Nocturia   . OSA (obstructive sleep apnea)    doesn't use CPAP;sleep study in epic from 2006  . Osteoarthritis   . Osteoarthritis   . Pneumonia    over 30 yrs ago  . Protein  calorie malnutrition ([place])   . Rheumatoid arthritis ([place])   . Sarcoidosis ([place])    Dr. Lolita Patella  . Short-term memory loss   . Shortness of breath   . TIA (transient ischemic attack)   . Unsteady gait   . Urinary urgency   . Vitamin D deficiency    is supposed to take Vit D but can't afford it  . VITAMIN D DEFICIENCY 08/25/2007      Review of Systems neg for any significant sore throat, dysphagia, itching, sneezing, nasal congestion or excess/ purulent secretions, fever, chills, sweats, unintended wt loss, pleuritic or exertional cp, hempoptysis, orthopnea pnd or change in chronic leg swelling. Also denies presyncope, palpitations, heartburn, abdominal pain, nausea, vomiting, diarrhea or change in bowel or urinary habits, dysuria,hematuria, rash, arthralgias, visual complaints, headache, numbness weakness or ataxia.     Objective:   Physical Exam   Gen. Pleasant, obese, in no distress, normal affect ENT - no lesions, no post nasal drip, class 2-3 airway Neck: No JVD, no thyromegaly, no carotid bruits Lungs: no use of accessory muscles, no dullness to percussion, decreased without rales or rhonchi  Cardiovascular: Rhythm regular, heart sounds  normal, no murmurs or gallops, no peripheral edema Abdomen: soft and non-tender, no hepatosplenomegaly, BS normal. Musculoskeletal: No  deformities, no cyanosis or clubbing Neuro:  alert, non focal, tremors +, poor balance, cannot tandem walk        Assessment & Plan:

## 2016-04-24 NOTE — Addendum Note (Signed)
Addended by: Valerie Salts on: 04/24/2016 02:31 PM   Modules accepted: Orders

## 2016-04-26 ENCOUNTER — Ambulatory Visit
Admission: RE | Admit: 2016-04-26 | Discharge: 2016-04-26 | Disposition: A | Discharge status: Home / Self Care | Payer: Medicare Other | Source: Ambulatory Visit | Attending: Pain Medicine | Admitting: Pain Medicine

## 2016-04-26 DIAGNOSIS — M4804 Spinal stenosis, thoracic region: Secondary | ICD-10-CM | POA: Diagnosis not present

## 2016-04-26 DIAGNOSIS — M546 Pain in thoracic spine: Secondary | ICD-10-CM

## 2016-04-26 DIAGNOSIS — M545 Low back pain: Secondary | ICD-10-CM

## 2016-04-26 DIAGNOSIS — M48061 Spinal stenosis, lumbar region without neurogenic claudication: Secondary | ICD-10-CM | POA: Diagnosis not present

## 2016-05-02 ENCOUNTER — Ambulatory Visit (HOSPITAL_COMMUNITY): Payer: Medicare Other

## 2016-05-06 DIAGNOSIS — G894 Chronic pain syndrome: Secondary | ICD-10-CM | POA: Diagnosis not present

## 2016-05-06 DIAGNOSIS — M47817 Spondylosis without myelopathy or radiculopathy, lumbosacral region: Secondary | ICD-10-CM | POA: Diagnosis not present

## 2016-05-06 DIAGNOSIS — M549 Dorsalgia, unspecified: Secondary | ICD-10-CM | POA: Diagnosis not present

## 2016-05-06 DIAGNOSIS — M545 Low back pain: Secondary | ICD-10-CM | POA: Diagnosis not present

## 2016-05-07 ENCOUNTER — Ambulatory Visit (HOSPITAL_COMMUNITY)
Admission: RE | Admit: 2016-05-07 | Discharge: 2016-05-07 | Disposition: A | Discharge status: Home / Self Care | Payer: Medicare Other | Source: Ambulatory Visit | Attending: Pulmonary Disease | Admitting: Pulmonary Disease

## 2016-05-07 DIAGNOSIS — Z8673 Personal history of transient ischemic attack (TIA), and cerebral infarction without residual deficits: Secondary | ICD-10-CM | POA: Insufficient documentation

## 2016-05-07 DIAGNOSIS — G319 Degenerative disease of nervous system, unspecified: Secondary | ICD-10-CM | POA: Diagnosis not present

## 2016-05-07 DIAGNOSIS — D869 Sarcoidosis, unspecified: Secondary | ICD-10-CM | POA: Diagnosis not present

## 2016-05-07 DIAGNOSIS — R296 Repeated falls: Secondary | ICD-10-CM | POA: Diagnosis not present

## 2016-05-09 ENCOUNTER — Telehealth: Payer: Self-pay | Admitting: Pulmonary Disease

## 2016-05-09 NOTE — Telephone Encounter (Signed)
Left message for patient to call back  

## 2016-05-13 ENCOUNTER — Other Ambulatory Visit: Payer: Self-pay | Admitting: Pulmonary Disease

## 2016-05-13 NOTE — Telephone Encounter (Signed)
Notes recorded by Rigoberto Noel, MD on 05/08/2016 at 9:11 AM EDT Evidence of old stroke No changes of neurosarcoidosis noted       Called and spoke with pt and she is aware of MRI results per RA.

## 2016-05-15 DIAGNOSIS — M47814 Spondylosis without myelopathy or radiculopathy, thoracic region: Secondary | ICD-10-CM | POA: Diagnosis not present

## 2016-05-15 DIAGNOSIS — M5134 Other intervertebral disc degeneration, thoracic region: Secondary | ICD-10-CM | POA: Diagnosis not present

## 2016-05-20 DIAGNOSIS — D8689 Sarcoidosis of other sites: Secondary | ICD-10-CM | POA: Diagnosis not present

## 2016-05-20 DIAGNOSIS — L438 Other lichen planus: Secondary | ICD-10-CM | POA: Diagnosis not present

## 2016-05-20 DIAGNOSIS — Z79899 Other long term (current) drug therapy: Secondary | ICD-10-CM | POA: Diagnosis not present

## 2016-05-21 DIAGNOSIS — G609 Hereditary and idiopathic neuropathy, unspecified: Secondary | ICD-10-CM | POA: Diagnosis not present

## 2016-05-21 DIAGNOSIS — F4542 Pain disorder with related psychological factors: Secondary | ICD-10-CM | POA: Diagnosis not present

## 2016-05-21 DIAGNOSIS — M792 Neuralgia and neuritis, unspecified: Secondary | ICD-10-CM | POA: Diagnosis not present

## 2016-05-21 DIAGNOSIS — G894 Chronic pain syndrome: Secondary | ICD-10-CM | POA: Diagnosis not present

## 2016-05-31 ENCOUNTER — Ambulatory Visit: Payer: Medicare Other | Admitting: Internal Medicine

## 2016-06-05 ENCOUNTER — Ambulatory Visit: Payer: Medicare Other | Admitting: Adult Health

## 2016-06-07 ENCOUNTER — Encounter: Payer: Self-pay | Admitting: Internal Medicine

## 2016-06-07 ENCOUNTER — Ambulatory Visit (INDEPENDENT_AMBULATORY_CARE_PROVIDER_SITE_OTHER)
Admission: RE | Admit: 2016-06-07 | Discharge: 2016-06-07 | Disposition: A | Discharge status: Home / Self Care | Payer: Medicare Other | Source: Ambulatory Visit | Attending: Internal Medicine | Admitting: Internal Medicine

## 2016-06-07 ENCOUNTER — Ambulatory Visit (INDEPENDENT_AMBULATORY_CARE_PROVIDER_SITE_OTHER): Payer: Medicare Other | Admitting: Internal Medicine

## 2016-06-07 DIAGNOSIS — D869 Sarcoidosis, unspecified: Secondary | ICD-10-CM | POA: Diagnosis not present

## 2016-06-07 DIAGNOSIS — M25511 Pain in right shoulder: Secondary | ICD-10-CM | POA: Diagnosis not present

## 2016-06-07 DIAGNOSIS — E538 Deficiency of other specified B group vitamins: Secondary | ICD-10-CM

## 2016-06-07 DIAGNOSIS — M898X1 Other specified disorders of bone, shoulder: Secondary | ICD-10-CM

## 2016-06-07 DIAGNOSIS — I10 Essential (primary) hypertension: Secondary | ICD-10-CM

## 2016-06-07 DIAGNOSIS — F410 Panic disorder [episodic paroxysmal anxiety] without agoraphobia: Secondary | ICD-10-CM

## 2016-06-07 MED ORDER — FUROSEMIDE 20 MG PO TABS: 20.0000 mg | ORAL_TABLET | Freq: Every day | ORAL | 3 refills | Status: DC

## 2016-06-07 MED ORDER — CYANOCOBALAMIN 1000 MCG/ML IJ SOLN
1000.0000 ug | Freq: Once | INTRAMUSCULAR | Status: AC
  Administered 2016-06-07: 1000 ug via INTRAMUSCULAR

## 2016-06-07 MED ORDER — VENLAFAXINE HCL ER 75 MG PO CP24: 75.0000 mg | ORAL_CAPSULE | Freq: Every day | ORAL | 5 refills | Status: DC

## 2016-06-07 NOTE — Assessment & Plan Note (Signed)
On Lexapro 

## 2016-06-07 NOTE — Assessment & Plan Note (Signed)
Re-start B12 

## 2016-06-07 NOTE — Progress Notes (Signed)
Subjective:  Patient ID: [name], female    DOB: 25-Dec-1960  Age: 56 y.o. MRN: [id number]  CC: No chief complaint on file.   HPI [name] presents for 2 mo f/u C/o R posterior CP x 3 wks - worse w/ROM. The pt had a thoracic/LS spine MRI per Dr Vira Blanco:  IMPRESSION: MR THORACIC SPINE IMPRESSION  Mild-to-moderate central left-sided disc protrusion at T7-8 with cord flattening and left-sided spinal stenosis. Otherwise negative  MR LUMBAR SPINE IMPRESSION  Four lumbar vertebral segments are present. The lowest disc space is L4-5.  5 mm anterolisthesis L4-5 with advanced facet degeneration left greater than right. No significant lumbar nerve root impingement.   Electronically Signed   By: Franchot Gallo M.D.   On: 04/26/2016 13:34   Seeing Dr Otho Ket in [place]; off MTX  Outpatient Medications Prior to Visit  Medication Sig Dispense Refill  . albuterol (PROVENTIL HFA;VENTOLIN HFA) 108 (90 Base) MCG/ACT inhaler Inhale 2 puffs into the lungs every 6 (six) hours as needed for wheezing or shortness of breath. 1 Inhaler 3  . Blood Glucose Monitoring Suppl (FREESTYLE LITE) DEVI Use to check blood sugar 2 times per day 1 each 0  . carvedilol (COREG) 12.5 MG tablet TAKE 1 TABLET BY MOUTH TWICE DAILY WITH A MEAL 180 tablet 2  . chlorhexidine (PERIDEX) 0.12 % solution Use as directed 5 mLs in the mouth or throat 2 (two) times daily as needed (thrush).   0  . clobetasol (TEMOVATE) 0.05 % external solution Apply 1 application topically 2 (two) times daily.  3  . escitalopram (LEXAPRO) 20 MG tablet TAKE 1 TABLET BY MOUTH DAILY 90 tablet 3  . furosemide (LASIX) 20 MG tablet Take 1 tablet (20 mg total) by mouth daily. 30 tablet 3  . gabapentin (NEURONTIN) 300 MG capsule Take 1 capsule (300 mg total) by mouth 2 (two) times daily. 60 capsule 5  . glucose blood (FREESTYLE LITE) test strip TEST TWICE DAILY 100 each 3  . KLOR-CON 10 10 MEQ tablet TAKE 1  TABLET BY MOUTH DAILY 90 tablet 3  . Lancets (FREESTYLE) lancets 1 each by Other route 2 (two) times daily. Use as instructed 100 each 12  . metFORMIN (GLUCOPHAGE-XR) 500 MG 24 hr tablet TAKE 2 TABLETS(1000 MG) BY MOUTH DAILY WITH BREAKFAST 180 tablet 0  . nortriptyline (PAMELOR) 75 MG capsule Take 1 capsule (75 mg total) by mouth at bedtime. 30 capsule 11  . omeprazole (PRILOSEC) 20 MG capsule Take 20 mg by mouth daily.    . predniSONE (DELTASONE) 10 MG tablet Take 1 tablet daily with food until next visit. 60 tablet 1  . Suvorexant (BELSOMRA) 20 MG TABS Take 20 mg by mouth at bedtime. 30 tablet 5  . topiramate (TOPAMAX) 50 MG tablet TAKE 1 TABLET BY MOUTH TWICE DAILY 180 tablet 2  . XARELTO 20 MG TABS tablet TAKE 1 TABLET BY MOUTH EVERY DAY WITH SUPPER 30 tablet 4  . amoxicillin (AMOXIL) 500 MG capsule Take for prior to dental procdures    . betamethasone dipropionate 0.05 % lotion 2 (two) times daily.  1  . docusate sodium (COLACE) 100 MG capsule Take 100 mg by mouth 2 (two) times daily.     . fluconazole (DIFLUCAN) 150 MG tablet Take 1 tablet (150 mg total) by mouth daily. 1 tablet 0  . fluticasone (CUTIVATE) 0.005 % ointment as needed.  0  . folic acid (FOLVITE) 1 MG tablet Take 1 tablet by  mouth daily.  3  . metFORMIN (GLUCOPHAGE) 500 MG tablet TAKE 2 TABLETS(1000 MG) BY MOUTH DAILY WITH FOOD 180 tablet 2  . methylPREDNISolone (MEDROL DOSEPAK) 4 MG TBPK tablet As directed po 21 tablet 0  . Mouthwashes (ANTISEPTIC MOUTH RINSE MT) RINSE WITH 10 ML AND THEN SPIT OUT. DO THIS QID PRN  0  . mupirocin ointment (BACTROBAN) 2 % Apply topically 4 (four) times daily. 22 g 0  . nystatin (MYCOSTATIN) 100000 UNIT/ML suspension     . polyethylene glycol (MIRALAX / GLYCOLAX) packet Take 17 g by mouth daily.     No facility-administered medications prior to visit.     ROS Review of Systems  Constitutional: Positive for unexpected weight change. Negative for activity change, appetite change, chills  and fatigue.  HENT: Negative for congestion, mouth sores and sinus pressure.   Eyes: Negative for visual disturbance.  Respiratory: Negative for cough and chest tightness.   Gastrointestinal: Negative for abdominal pain and nausea.  Genitourinary: Negative for difficulty urinating, frequency and vaginal pain.  Musculoskeletal: Positive for arthralgias, back pain, gait problem, joint swelling, neck pain and neck stiffness.  Skin: Negative for pallor and rash.  Neurological: Positive for weakness. Negative for dizziness, tremors, numbness and headaches.  Psychiatric/Behavioral: Negative for confusion and sleep disturbance.    Objective:  BP 116/76 (BP Location: Right Arm, Patient Position: Sitting, Cuff Size: Large)   Pulse 75   Temp 97.8 F (36.6 C) (Oral)   Ht 5\' 7"  (1.702 m)   Wt (!) 308 lb (139.7 kg)   SpO2 98%   BMI 48.24 kg/m   BP Readings from Last 3 Encounters:  06/07/16 116/76  04/24/16 118/80  03/20/16 (!) 98/56    Wt Readings from Last 3 Encounters:  06/07/16 (!) 308 lb (139.7 kg)  04/24/16 290 lb (131.5 kg)  04/04/16 286 lb (129.7 kg)    Physical Exam  Constitutional: She appears well-developed. No distress.  HENT:  Head: Normocephalic.  Right Ear: External ear normal.  Left Ear: External ear normal.  Nose: Nose normal.  Mouth/Throat: Oropharynx is clear and moist.  Eyes: Conjunctivae are normal. Pupils are equal, round, and reactive to light. Right eye exhibits no discharge. Left eye exhibits no discharge.  Neck: Normal range of motion. Neck supple. No JVD present. No tracheal deviation present. No thyromegaly present.  Cardiovascular: Normal rate, regular rhythm and normal heart sounds.   Pulmonary/Chest: No stridor. No respiratory distress. She has no wheezes.  Abdominal: Soft. Bowel sounds are normal. She exhibits no distension and no mass. There is no tenderness. There is no rebound and no guarding.  Musculoskeletal: She exhibits tenderness. She exhibits  no edema.  Lymphadenopathy:    She has no cervical adenopathy.  Neurological: She displays normal reflexes. No cranial nerve deficit. She exhibits normal muscle tone. Coordination abnormal.  Skin: No rash noted. No erythema.  Psychiatric: She has a normal mood and affect. Her behavior is normal. Judgment and thought content normal.  Obese R scapula and lower ribs - very tender  Lab Results  Component Value Date   WBC 6.4 01/30/2016   HGB 12.5 01/30/2016   HCT 37.1 01/30/2016   PLT 291.0 01/30/2016   GLUCOSE 111 (H) 01/30/2016   CHOL 193 06/29/2008   TRIG 145.0 06/29/2008   HDL 67.50 06/29/2008   LDLCALC 97 06/29/2008   ALT 88 (H) 01/30/2016   AST 67 (H) 01/30/2016   NA 139 01/30/2016   K 4.1 01/30/2016   CL 104 01/30/2016  CREATININE 1.04 01/30/2016   BUN 21 01/30/2016   CO2 25 01/30/2016   TSH 1.77 01/30/2016   INR 0.93 08/17/2015   HGBA1C 5.9 01/30/2016   MICROALBUR <0.7 02/01/2015    Mr Brain Wo Contrast  Result Date: 05/07/2016 CLINICAL DATA:  Imbalance. Increased falls. Possible neurosarcoidosis. EXAM: MRI HEAD WITHOUT CONTRAST TECHNIQUE: Multiplanar, multiecho pulse sequences of the brain and surrounding structures were obtained without intravenous contrast. COMPARISON:  CT head 05/06/2013. FINDINGS: Brain: No evidence for acute infarction, hemorrhage, mass lesion, hydrocephalus, or extra-axial fluid. Premature for age cerebral and cerebellar atrophy remote RIGHT basal ganglia/caudate infarct. Tiny cerebellar/posterior circulation infarcts also noted. Moderately advanced T2 and FLAIR hyperintensities in the periventricular and subcortical white matter consistent with small vessel disease. Vascular: Normal flow voids. Skull and upper cervical spine: Normal marrow signal. Sinuses/Orbits: Mild chronic sinus disease. No layering fluid. Unremarkable orbits. Other: Trace LEFT mastoid effusion. IMPRESSION: Premature atrophy.  Remote RIGHT basal ganglia infarct. Subcortical and  periventricular white matter signal abnormality consistent with chronic microvascular ischemic change. Neurosarcoidosis is optimally evaluated on post infusion images. If there are no contraindications, to gadolinium administration, MRI brain with contrast is recommended for further evaluation. Electronically Signed   By: Staci Righter M.D.   On: 05/07/2016 14:51    Assessment & Plan:   There are no diagnoses linked to this encounter. I have discontinued Ms. Locklin's docusate sodium, polyethylene glycol, amoxicillin, fluticasone, betamethasone dipropionate, folic acid, fluconazole, nystatin, Mouthwashes (ANTISEPTIC MOUTH RINSE MT), mupirocin ointment, and methylPREDNISolone. I am also having her maintain her clobetasol, glucose blood, freestyle, FREESTYLE LITE, chlorhexidine, furosemide, escitalopram, omeprazole, Suvorexant, nortriptyline, albuterol, carvedilol, topiramate, KLOR-CON 10, gabapentin, metFORMIN, predniSONE, XARELTO, Adalimumab, and nystatin ointment.  Meds ordered this encounter  Medications  . Adalimumab 40 MG/0.8ML PNKT    Sig: Maintenance dose 40mg  Proberta every other week, dx sarcoidosis  . nystatin ointment (MYCOSTATIN)    Sig: APPLY LIBERALLY QID AA    Refill:  0     Follow-up: No Follow-up on file.  Walker Kehr, MD

## 2016-06-07 NOTE — Assessment & Plan Note (Signed)
X ray scapula  The pt had a thoracic/LS spine MRI per Dr Vira Blanco:  IMPRESSION: MR THORACIC SPINE IMPRESSION  Mild-to-moderate central left-sided disc protrusion at T7-8 with cord flattening and left-sided spinal stenosis. Otherwise negative  MR LUMBAR SPINE IMPRESSION  Four lumbar vertebral segments are present. The lowest disc space is L4-5.  5 mm anterolisthesis L4-5 with advanced facet degeneration left greater than right. No significant lumbar nerve root impingement.   Electronically Signed   By: Franchot Gallo M.D.   On: 04/26/2016 13:34

## 2016-06-07 NOTE — Assessment & Plan Note (Signed)
Seeing Dr Otho Ket in [place]; off MTX

## 2016-06-07 NOTE — Addendum Note (Signed)
Addended by: Karren Cobble on: 06/07/2016 04:21 PM   Modules accepted: Orders

## 2016-06-07 NOTE — Assessment & Plan Note (Signed)
On Coreg 

## 2016-06-14 ENCOUNTER — Ambulatory Visit (INDEPENDENT_AMBULATORY_CARE_PROVIDER_SITE_OTHER): Payer: Medicare Other | Admitting: Adult Health

## 2016-06-14 ENCOUNTER — Encounter: Payer: Self-pay | Admitting: Adult Health

## 2016-06-14 DIAGNOSIS — D869 Sarcoidosis, unspecified: Secondary | ICD-10-CM | POA: Diagnosis not present

## 2016-06-14 DIAGNOSIS — R2689 Other abnormalities of gait and mobility: Secondary | ICD-10-CM

## 2016-06-14 MED ORDER — PREDNISONE 10 MG PO TABS: ORAL_TABLET | ORAL | 3 refills | Status: DC

## 2016-06-14 NOTE — Addendum Note (Signed)
Addended by: Parke Poisson E on: 06/14/2016 01:06 PM   Modules accepted: Orders

## 2016-06-14 NOTE — Patient Instructions (Signed)
Continue follow up with Dermatology and Humira.  Continue on prednisone 10mg  daily.  Follow up with Spine center as planned next week.  Follow up Dr. Elsworth Soho  In 6-8 weeks and As needed   Please contact office for sooner follow up if symptoms do not improve or worsen or seek emergency care

## 2016-06-14 NOTE — Progress Notes (Signed)
@Patient  ID: [name], female    DOB: 12-Mar-1960, 56 y.o.   MRN: [id number]  Chief Complaint  Patient presents with  . Follow-up    Sarcoidosis    Referring provider: Cassandria Anger, MD  HPI: 56 year old female followed for pulmonary and skin sarcoidosis, lung nodules and previous provoked pulmonary embolus diagnosed in 2017 Followed by Dermatology -failed MTX.   Significant tests/ events  PFT 05/2014 >> FVC 65%, ratio 69, FEV1 58%, DLCO 62%  VQ Scan 08/17/15 >>LLL &RUL acute PE   ECHO 08/18/15 >>mild LVH , EF 55-60%, Gr 1 DD .   LE Venous Doppler 08/18/15 >>neg DVT , no bakers cyst, ?prominent inguinal lymph node measuring 3.1cm    06/14/2016 Follow up : Sarcoid  Pt presents for a 1 month follow up . She has pulmonary and skin sarcoid . She has been having increased sarcoid flare of skin . MTX was not working . She has been changed to Humira . She started first injection last week.  Says after 1 injection skin is improving .  She remains on prednisone 10mg  daily . Says derm does not want her to stop yet until few doses of Humira are completed.  Denies cough , wheezing or hemotpyss.   She had provoked PE after surgery in 2017 , Took Xarelto from August 2017 till 04/2016 .    Had balance issues last ov , MRI brain was ordered . Did not show any sign of neurosarcoid . Did Show evidence of old stroke. She was aware of this.    Seen at spine clinic . MRI of spine that showed mild to mod disc protrusion and left sided spinal stenosis . Has chronic joint pain.   Allergies  Allergen Reactions  . Enalapril Maleate     REACTION: cough  . Hydrochlorothiazide     REACTION: Low potassium  . Hydroxychloroquine Sulfate     REACTION: vision changes  . Iodine     Mother was intolerant--CODED on CT table---pt never tired.  . Latex     Hives  . Nickel Other (See Comments)    Pt has a titanium right and left knee - nickel causes skin irritations that forms into  blisters and sores    Immunization History  Administered Date(s) Administered  . H1N1 12/24/2007  . Influenza Split 10/11/2010  . Influenza Whole 10/14/2007, 10/05/2008, 10/26/2009, 11/13/2011  . Influenza,inj,Quad PF,36+ Mos 09/21/2012, 09/24/2013, 10/06/2014, 09/05/2015  . PPD Test 11/18/2014  . Pneumococcal Conjugate-13 07/30/2010, 12/29/2012  . Pneumococcal Polysaccharide-23 12/23/2013  . Tdap 06/28/2014    Past Medical History:  Diagnosis Date  . Allergic rhinitis   . ALLERGIC RHINITIS 10/26/2009  . Anxiety   . ANXIETY 08/14/2006  . B12 DEFICIENCY 08/25/2007  . Confusion   . Depression    takes Lexapro daily  . Diabetes mellitus without complication ([place])    was on insulin but has been off since Nov 2015 and now only takes Metformin daily  . DYSPNEA 04/28/2009   with exertion  . Esophageal reflux    takes Nexium daily  . Fibromyalgia   . Headache    last migraine 2-58yrs ago;takes Topamax daily  . History of shingles   . Hypertension    takes Coreg daily  . Insomnia    takes Nortriptyline nightly   . Joint pain   . Joint swelling   . Left knee pain   . Lichen planus   . Long-term memory loss   . Nocturia   .  OSA (obstructive sleep apnea)    doesn't use CPAP;sleep study in epic from 2006  . Osteoarthritis   . Osteoarthritis   . Pneumonia    over 30 yrs ago  . Protein calorie malnutrition ([place])   . Rheumatoid arthritis ([place])   . Sarcoidosis    Dr. Lolita Patella  . Short-term memory loss   . Shortness of breath   . TIA (transient ischemic attack)   . Unsteady gait   . Urinary urgency   . Vitamin D deficiency    is supposed to take Vit D but can't afford it  . VITAMIN D DEFICIENCY 08/25/2007    Tobacco History: History  Smoking Status  . Former Smoker  . Packs/day: 0.50  . Years: 10.00  . Types: Cigarettes  Smokeless Tobacco  . Never Used    Comment: quit smoking in 2004   Counseling given: Not Answered   Outpatient Encounter Prescriptions as of  06/14/2016  Medication Sig  . Adalimumab 40 MG/0.8ML PNKT Maintenance dose 40mg  [place] every other week, dx sarcoidosis  . albuterol (PROVENTIL HFA;VENTOLIN HFA) 108 (90 Base) MCG/ACT inhaler Inhale 2 puffs into the lungs every 6 (six) hours as needed for wheezing or shortness of breath.  . Blood Glucose Monitoring Suppl (FREESTYLE LITE) DEVI Use to check blood sugar 2 times per day  . carvedilol (COREG) 12.5 MG tablet TAKE 1 TABLET BY MOUTH TWICE DAILY WITH A MEAL  . chlorhexidine (PERIDEX) 0.12 % solution Use as directed 5 mLs in the mouth or throat 2 (two) times daily as needed (thrush).   . clobetasol (TEMOVATE) 0.05 % external solution Apply 1 application topically 2 (two) times daily.  . furosemide (LASIX) 20 MG tablet Take 1 tablet (20 mg total) by mouth daily.  [name] gabapentin (NEURONTIN) 300 MG capsule Take 1 capsule (300 mg total) by mouth 2 (two) times daily.  [name] glucose blood (FREESTYLE LITE) test strip TEST TWICE DAILY  . KLOR-CON 10 10 MEQ tablet TAKE 1 TABLET BY MOUTH DAILY  . Lancets (FREESTYLE) lancets 1 each by Other route 2 (two) times daily. Use as instructed  . metFORMIN (GLUCOPHAGE-XR) 500 MG 24 hr tablet TAKE 2 TABLETS(1000 MG) BY MOUTH DAILY WITH BREAKFAST  . nortriptyline (PAMELOR) 75 MG capsule Take 1 capsule (75 mg total) by mouth at bedtime.  [name] nystatin ointment (MYCOSTATIN) APPLY LIBERALLY QID AA  . omeprazole (PRILOSEC) 20 MG capsule Take 20 mg by mouth daily.  . predniSONE (DELTASONE) 10 MG tablet Take 1 tablet daily with food until next visit.  . Suvorexant (BELSOMRA) 20 MG TABS Take 20 mg by mouth at bedtime.  . topiramate (TOPAMAX) 50 MG tablet TAKE 1 TABLET BY MOUTH TWICE DAILY  . venlafaxine XR (EFFEXOR XR) 75 MG 24 hr capsule Take 1 capsule (75 mg total) by mouth daily with breakfast.  . [DISCONTINUED] XARELTO 20 MG TABS tablet TAKE 1 TABLET BY MOUTH EVERY DAY WITH SUPPER (Patient not taking: Reported on 06/14/2016)   No facility-administered encounter medications on  file as of 06/14/2016.      Review of Systems  Constitutional:   No  weight loss, night sweats,  Fevers, chills, + fatigue, or  lassitude.  HEENT:   No headaches,  Difficulty swallowing,  Tooth/dental problems, or  Sore throat,                No sneezing, itching, ear ache, nasal congestion, post nasal drip,   CV:  No chest pain,  Orthopnea, PND, swelling in lower extremities,  anasarca, dizziness, palpitations, syncope.   GI  No heartburn, indigestion, abdominal pain, nausea, vomiting, diarrhea, change in bowel habits, loss of appetite, bloody stools.   Resp: No shortness of breath with exertion or at rest.  No excess mucus, no productive cough,  No non-productive cough,  No coughing up of blood.  No change in color of mucus.  No wheezing.  No chest wall deformity  Skin: + sarcoid skin changes.   GU: no dysuria, change in color of urine, no urgency or frequency.  No flank pain, no hematuria   MS:  + balance issues, chronci joint pain    Physical Exam  BP 112/74 (BP Location: Right Arm, Cuff Size: Large)   Pulse 70   Ht 5\' 7"  (1.702 m)   Wt (!) 307 lb (139.3 kg)   SpO2 95%   BMI 48.08 kg/m   GEN: A/Ox3; pleasant , NAD, obese ,    HEENT:  [place]/AT,  EACs-clear, TMs-wnl, NOSE-clear, THROAT-clear, no lesions, no postnasal drip or exudate noted.   NECK:  Supple w/ fair ROM; no JVD; normal carotid impulses w/o bruits; no thyromegaly or nodules palpated; no lymphadenopathy.    RESP  Clear  P & A; w/o, wheezes/ rales/ or rhonchi. no accessory muscle use, no dullness to percussion  CARD:  RRR, no m/r/g, tr  peripheral edema, pulses intact, no cyanosis or clubbing.  GI:   Soft & nt; nml bowel sounds; no organomegaly or masses detected.   Musco: Warm bil, no deformities or joint swelling noted.   Neuro: alert, no focal deficits noted.    Skin: Warm, facial hyperpigmentation noted    Lab Results:  CBC  BMET  BNP No results found for: BNP  ProBNP    Component Value  Date/Time   PROBNP 16.0 10/22/2006 1118    Imaging: Dg Scapula Right  Result Date: 06/07/2016 CLINICAL DATA:  Fall 3 weeks ago with shoulder pain EXAM: RIGHT SCAPULA - 2+ VIEWS COMPARISON:  None. FINDINGS: Mild degenerative changes of the acromioclavicular joint are seen. The scapula appears intact. No dislocation is noted. No underlying bony abnormality is seen. IMPRESSION: No acute abnormality noted. Electronically Signed   By: Inez Catalina M.D.   On: 06/07/2016 20:57     Assessment & Plan:   Sarcoidosis Appears stable from pulmonary standpoint  Improving skin issues on Humira.  Remain on low dose prednisone until on Humira for few treatments . Will assess on return . Cont follow up with derm and their recs.   Plan  . Patient Instructions  Continue follow up with Dermatology and Humira.  Continue on prednisone 10mg  daily.  Follow up with Spine center as planned next week.  Follow up Dr. Elsworth Soho  In 6-8 weeks and As needed   Please contact office for sooner follow up if symptoms do not improve or worsen or seek emergency care       Balance problems Balance issues - ? Etiology  MRI w/ no sign of neuro sacroid changes  Cont /w spine center . May benefit from PT  If not , may need neuro referral .       Rexene Edison, NP 06/14/2016

## 2016-06-14 NOTE — Assessment & Plan Note (Signed)
Balance issues - ? Etiology  MRI w/ no sign of neuro sacroid changes  Cont /w spine center . May benefit from PT  If not , may need neuro referral .

## 2016-06-14 NOTE — Assessment & Plan Note (Signed)
Appears stable from pulmonary standpoint  Improving skin issues on Humira.  Remain on low dose prednisone until on Humira for few treatments . Will assess on return . Cont follow up with derm and their recs.   Plan  . Patient Instructions  Continue follow up with Dermatology and Humira.  Continue on prednisone 10mg  daily.  Follow up with Spine center as planned next week.  Follow up Dr. Elsworth Soho  In 6-8 weeks and As needed   Please contact office for sooner follow up if symptoms do not improve or worsen or seek emergency care

## 2016-06-17 DIAGNOSIS — G894 Chronic pain syndrome: Secondary | ICD-10-CM | POA: Diagnosis not present

## 2016-06-17 DIAGNOSIS — M47814 Spondylosis without myelopathy or radiculopathy, thoracic region: Secondary | ICD-10-CM | POA: Diagnosis not present

## 2016-06-17 DIAGNOSIS — M47817 Spondylosis without myelopathy or radiculopathy, lumbosacral region: Secondary | ICD-10-CM | POA: Diagnosis not present

## 2016-06-17 DIAGNOSIS — M5134 Other intervertebral disc degeneration, thoracic region: Secondary | ICD-10-CM | POA: Diagnosis not present

## 2016-06-25 ENCOUNTER — Other Ambulatory Visit: Payer: Self-pay | Admitting: Internal Medicine

## 2016-06-25 NOTE — Progress Notes (Signed)
Reviewed & agree with plan  

## 2016-06-26 ENCOUNTER — Other Ambulatory Visit: Payer: Self-pay | Admitting: *Deleted

## 2016-06-26 ENCOUNTER — Telehealth: Payer: Self-pay | Admitting: Internal Medicine

## 2016-06-26 NOTE — Patient Outreach (Signed)
[facility]) Care Management  06/26/2016  [place] 29-Oct-1960 [id number]   Telephone Screen  Referral Date: 06/25/16 Referral Source: EMMI Prevent Referral Reason:Falls Insurance:Medicare, Medicaid  Outreach attempt # 1 spoke with patient regarding questionnaire with Agricultural [employer]. HIPAA verified with patient.    Social:  Patient lives alone. She is independent with ADLs. Patient uses [facility] for medical appointments. Patient uses a rollator and a quad cane. She is active with [facility].  HHA assist patient with ADLs 1.5 hours/day for 7 days/week.   Conditions: Past Medical Hx: DM, HTN, CVA, Fibromyalgia, Pulmonary embolus, Sarcoidosis Patient reported having short term memory loss. Patient's forgets "within 15 minutes of doing things", per patient. She explained, she can be walking and spontaneously falls down. She voiced having 3 to 4 falls within the last 3 months. She couldn't recall how many falls that she had in the last year. She recently had a fall in March 2018, which she sustained a back injury. She reported having an MRI of her spine, however she doesn't know the results of the MRI. She stated one her doctors attempted to explain the results of the MRI. "The doctor used language that she didn't understand". Patient described her back pain as constant stabbing, sharp, and rated it at 9. Patient rated the pain in her legs and shoulders at 6. Patient voiced having increased difficulty with pain management, limited mobility, and memory loss.  Medications:  Patient reported taking 15 meds per day. Patient reported being able to afford her medications and taking them as prescribed. Patient had no questions or concerns about her meds.   Appointments: Patient's next appointment is scheduled on 07/26/16 with her PCP. Patient is active with the [facility] related to falls.   Advanced Directives: Patient requested information  regarding AD.  Consent:  [facility] services reviewed and discussed with patient. Verbal consent for services given.   Plan: RN CM will send pharmacy referral for possible assistance with polypharmacy.  RN CM will send SW referral for possible placement for safety evaluation.  RN CM advised patient to contact RNCM for any needs or concerns. RN CM provided patient with 24 hour Nurse Advice Line number.  [place], RN, BSN, MHA/MSL, [place] Telephonic Care Manager Coordinator [facility] Direct Phone: [phone] Toll Free: [phone] Fax: [phone]

## 2016-06-26 NOTE — Telephone Encounter (Signed)
Pt would like a call back with her MRI results from 5/25, she was given the results but did nto understand them.

## 2016-06-27 NOTE — Telephone Encounter (Signed)
After speaking with patient, she would like a x-ray of her ribs because she is still in pain. According to her OV she was having chest pain.

## 2016-06-27 NOTE — Progress Notes (Signed)
This encounter was created in error - please disregard.

## 2016-06-27 NOTE — Telephone Encounter (Signed)
We X rayed ribs and shoulder blade - no fx Brain MRI: no sarcoid in the brain; there is an old stroke Thx

## 2016-06-28 ENCOUNTER — Encounter: Payer: Self-pay | Admitting: *Deleted

## 2016-06-28 NOTE — Telephone Encounter (Signed)
Called refill into [facility] had to leave on pharmacy vm.../lmb 

## 2016-06-28 NOTE — Telephone Encounter (Signed)
Pt.notified

## 2016-07-01 DIAGNOSIS — Z79899 Other long term (current) drug therapy: Secondary | ICD-10-CM | POA: Diagnosis not present

## 2016-07-01 DIAGNOSIS — D8689 Sarcoidosis of other sites: Secondary | ICD-10-CM | POA: Diagnosis not present

## 2016-07-02 ENCOUNTER — Other Ambulatory Visit: Payer: Self-pay | Admitting: *Deleted

## 2016-07-02 NOTE — Patient Outreach (Signed)
[facility]) Care Management  07/02/2016  DALYNN JHAVERI 11-01-1960 [id number]   CSW made an initial attempt to try and contact patient today to perform phone assessment, as well as assess and assist with social needs and services, without success.  A HIPAA compliant message was left for patient on voicemail.  CSW is currently awaiting a return call. CSW will make a second outreach attempt within the next week, if CSW does not receive a return call from patient in the meantime. [name], BSW, MSW, LCSW  Licensed Education officer, environmental Health System  Mailing [place] [address] [place], [place] [address] Physical Address-[address], [place], [place] [address] Toll Free Main # [phone] Fax # [phone] Cell # [phone]  Office # [phone] [email or web address]

## 2016-07-03 ENCOUNTER — Ambulatory Visit: Payer: Self-pay | Admitting: *Deleted

## 2016-07-04 ENCOUNTER — Telehealth: Payer: Self-pay | Admitting: Pulmonary Disease

## 2016-07-04 ENCOUNTER — Telehealth: Payer: Self-pay | Admitting: Pharmacist

## 2016-07-04 MED ORDER — AZITHROMYCIN 250 MG PO TABS: 250.0000 mg | ORAL_TABLET | ORAL | 0 refills | Status: DC

## 2016-07-04 NOTE — Telephone Encounter (Signed)
Spoke with patient. She states she has had a productive cough with yellow to green phlegm since last Saturday. She said she also feels feverish and her SOB has increased. She denied any chest pains.   Pt has taken OTC cough meds and they have not helped.   She wishes to [facility].   RA, please advise. Thanks.

## 2016-07-04 NOTE — Patient Outreach (Signed)
[facility]) Care Management  07/04/2016  [place] [birth year] [id number]   Patient was called regarding medication management per referral from [place], [place].  Unfortunately, patient did not answer.  HIPAA compliant message was left on the patient's voicemail.   Plan:  I will call the patient back in 5-7 business days.  Elayne Guerin, PharmD, [place] Clinical Pharmacist [phone]

## 2016-07-04 NOTE — Telephone Encounter (Signed)
Spoke with patient. Advised her to not take the nortriptyline while taking the zpak. She verbalized understanding. Medication has been called into her pharmacy.

## 2016-07-04 NOTE — Telephone Encounter (Signed)
z-pak 

## 2016-07-04 NOTE — Telephone Encounter (Signed)
Ask them to hold nortyptiline x 5ds while on zpak

## 2016-07-04 NOTE — Telephone Encounter (Signed)
Alert received when trying to send medication to pharmacy.   Drug-Drug: nortriptyline and azithromycin  The rare occurrence of arrhythmias resulting from the potential for additive QT prolongation should be considered as a possibility when Tricyclic Antidepressants and Macrolides are coadministered. Clinical impact is not known.  Please advise Dr Elsworth Soho. Thanks.

## 2016-07-08 ENCOUNTER — Other Ambulatory Visit: Payer: Self-pay | Admitting: *Deleted

## 2016-07-08 ENCOUNTER — Encounter: Payer: Self-pay | Admitting: *Deleted

## 2016-07-08 ENCOUNTER — Telehealth (INDEPENDENT_AMBULATORY_CARE_PROVIDER_SITE_OTHER): Payer: Self-pay | Admitting: Orthopedic Surgery

## 2016-07-08 MED ORDER — DICLOFENAC SODIUM 1 % TD GEL: 2.0000 g | Freq: Two times a day (BID) | TRANSDERMAL | 1 refills | Status: DC

## 2016-07-08 NOTE — Telephone Encounter (Signed)
y

## 2016-07-08 NOTE — Telephone Encounter (Signed)
IC advised submitted.  

## 2016-07-08 NOTE — Addendum Note (Signed)
Addended byLaurann Montana on: 07/08/2016 04:01 PM   Modules accepted: Orders

## 2016-07-08 NOTE — Patient Outreach (Signed)
[facility]) Care Management  07/08/2016  [name] [birth year] [id number]   CSW was able to make initial contact with patient today to perform phone assessment, as well as assess and assist with social work needs and services.  CSW introduced self, explained role and types of services provided through [facility] ([place] [facility]).  CSW further explained to patient that CSW works with patient's RNCM, also with [facility], Juanita Futrell. CSW then explained the reason for the call, indicating that [name] thought that patient would benefit from social work services and resources to assist with referrals to various community agencies and resources.  CSW obtained two HIPAA compliant identifiers from patient, which included patient's name and date of birth. Patient admitted to [name] that she has fallen a lot within the past year and is mostly interested in having CSW perform a home safety evaluation.  CSW agreed to meet with patient to perform an initial home visit to assess her home environment and make possible recommendations.  CSW and patient agreed to meet on Thursday, July 12th at 1:00pm, upon CSW's return to the office from vacation.  Patient voiced understanding and was agreeable to this plan.  CSW was able to ensure that patient has the correct contact information for CSW, encouraging patient to contact CSW directly if social work services are needed in the meantime.   [name], BSW, MSW, LCSW  Licensed Education officer, environmental Health System  Mailing [place] [address] [place], [place] [address] Physical Address-[address], [place], [place] [address] Toll Free Main # [phone] Fax # [phone] Cell # [phone]  Office # [phone] [email or web address]

## 2016-07-08 NOTE — Telephone Encounter (Signed)
Patient called asking for a refill on the voltaren gel. CB # [phone]

## 2016-07-08 NOTE — Telephone Encounter (Signed)
Patient last seen 09/2015. Ok to rf?

## 2016-07-10 ENCOUNTER — Other Ambulatory Visit: Payer: Self-pay | Admitting: Pharmacist

## 2016-07-11 ENCOUNTER — Ambulatory Visit (INDEPENDENT_AMBULATORY_CARE_PROVIDER_SITE_OTHER)
Admission: RE | Admit: 2016-07-11 | Discharge: 2016-07-11 | Disposition: A | Discharge status: Home / Self Care | Payer: Medicare Other | Source: Ambulatory Visit | Attending: Family | Admitting: Family

## 2016-07-11 ENCOUNTER — Encounter: Payer: Self-pay | Admitting: Family

## 2016-07-11 ENCOUNTER — Ambulatory Visit (INDEPENDENT_AMBULATORY_CARE_PROVIDER_SITE_OTHER): Payer: Medicare Other | Admitting: Family

## 2016-07-11 VITALS — BP 130/72 | HR 85 | Temp 98.3°F | Resp 20 | Ht 67.0 in | Wt 313.0 lb

## 2016-07-11 DIAGNOSIS — R05 Cough: Secondary | ICD-10-CM

## 2016-07-11 DIAGNOSIS — E538 Deficiency of other specified B group vitamins: Secondary | ICD-10-CM

## 2016-07-11 DIAGNOSIS — R059 Cough, unspecified: Secondary | ICD-10-CM

## 2016-07-11 MED ORDER — PREDNISONE 10 MG (21) PO TBPK: ORAL_TABLET | ORAL | 0 refills | Status: DC

## 2016-07-11 MED ORDER — LEVOFLOXACIN 500 MG PO TABS
500.0000 mg | ORAL_TABLET | Freq: Every day | ORAL | 0 refills | Status: DC
Start: 2016-07-11 — End: 2016-07-21

## 2016-07-11 MED ORDER — CYANOCOBALAMIN 1000 MCG/ML IJ SOLN
1000.0000 ug | Freq: Once | INTRAMUSCULAR | Status: AC
  Administered 2016-07-11: 1000 ug via INTRAMUSCULAR

## 2016-07-11 NOTE — Progress Notes (Signed)
Subjective:    Patient ID: [name], female    DOB: Nov 07, 1960, 56 y.o.   MRN: [id number]  Chief Complaint  Patient presents with  . Cough    for over a week has had a bad cough, headache, green congestion, SOB     HPI:     [name] is a 56 y.o. female who  has a past medical history of Allergic rhinitis; ALLERGIC RHINITIS (10/26/2009); Anxiety; ANXIETY (08/14/2006); B12 DEFICIENCY (08/25/2007); Confusion; Depression; Diabetes mellitus without complication ([place]); DYSPNEA (04/28/2009); Esophageal reflux; Fibromyalgia; Headache; History of shingles; Hypertension; Insomnia; Joint pain; Joint swelling; Left knee pain; Lichen planus; Long-term memory loss; Nocturia; OSA (obstructive sleep apnea); Osteoarthritis; Osteoarthritis; Pneumonia; Protein calorie malnutrition ([place]); Rheumatoid arthritis ([place]); Sarcoidosis; Short-term memory loss; Shortness of breath; TIA (transient ischemic attack); Unsteady gait; Urinary urgency; Vitamin D deficiency; and VITAMIN D DEFICIENCY (08/25/2007). and presents today for an acute office visit.  This is a new problem. Associated symptoms of headache, congestion, productive cough with green sputum and shortness of breath have been going on for about a week. Course of the symptoms have gotten worse since initial onset. Denies fevers. Recetnly completed a Zpack which she reports has not helped with her symptoms.  No sick contacts.   Allergies  Allergen Reactions  . Enalapril Maleate     REACTION: cough  . Hydrochlorothiazide     REACTION: Low potassium  . Hydroxychloroquine Sulfate     REACTION: vision changes  . Iodine     Mother was intolerant--CODED on CT table---pt never tired.  . Latex     Hives  . Nickel Other (See Comments)    Pt has a titanium right and left knee - nickel causes skin irritations that forms into blisters and sores Other reaction(s): Other (See Comments) Pt has a titanium right knee - nickel causes skin  irritations that forms into blisters and sores Pt has a titanium right and left knee - nickel causes skin irritations that forms into blisters and sores      Outpatient Medications Prior to Visit  Medication Sig Dispense Refill  . Adalimumab 40 MG/0.8ML PNKT Maintenance dose 40mg  [place] every other week, dx sarcoidosis    . albuterol (PROVENTIL HFA;VENTOLIN HFA) 108 (90 Base) MCG/ACT inhaler Inhale 2 puffs into the lungs every 6 (six) hours as needed for wheezing or shortness of breath. 1 Inhaler 3  . BELSOMRA 20 MG TABS TAKE 1 TABLET BY MOUTH EVERY NIGHT AT BEDTIME 30 tablet 5  . Blood Glucose Monitoring Suppl (FREESTYLE LITE) DEVI Use to check blood sugar 2 times per day 1 each 0  . carvedilol (COREG) 12.5 MG tablet TAKE 1 TABLET BY MOUTH TWICE DAILY WITH A MEAL 180 tablet 2  . chlorhexidine (PERIDEX) 0.12 % solution Use as directed 5 mLs in the mouth or throat 2 (two) times daily as needed (thrush).   0  . diclofenac sodium (VOLTAREN) 1 % GEL Apply 2 g topically 2 (two) times daily. 1 Tube 1  . fluocinonide gel (LIDEX) 5.57 % Apply 1 application topically 2 (two) times daily. Apply a thin ammount to gums 4-6 times per day    . furosemide (LASIX) 20 MG tablet Take 1 tablet (20 mg total) by mouth daily. 30 tablet 3  . gabapentin (NEURONTIN) 300 MG capsule Take 1 capsule (300 mg total) by mouth 2 (two) times daily. 60 capsule 5  . glucose blood (FREESTYLE LITE) test strip TEST TWICE DAILY 100 each 3  .  KLOR-CON 10 10 MEQ tablet TAKE 1 TABLET BY MOUTH DAILY 90 tablet 3  . Lancets (FREESTYLE) lancets 1 each by Other route 2 (two) times daily. Use as instructed 100 each 12  . metFORMIN (GLUCOPHAGE-XR) 500 MG 24 hr tablet TAKE 2 TABLETS(1000 MG) BY MOUTH DAILY WITH BREAKFAST 180 tablet 0  . nortriptyline (PAMELOR) 75 MG capsule Take 1 capsule (75 mg total) by mouth at bedtime. 30 capsule 11  . nystatin ointment (MYCOSTATIN) APPLY LIBERALLY QID AA  0  . omeprazole (PRILOSEC) 20 MG capsule Take 20 mg  by mouth daily.    . predniSONE (DELTASONE) 10 MG tablet Take 1 tablet daily with food (Patient taking differently: Take 5 mg by mouth. Take 1 tablet daily with food) 30 tablet 3  . topiramate (TOPAMAX) 50 MG tablet TAKE 1 TABLET BY MOUTH TWICE DAILY 180 tablet 2  . venlafaxine XR (EFFEXOR XR) 75 MG 24 hr capsule Take 1 capsule (75 mg total) by mouth daily with breakfast. 30 capsule 5  . azithromycin (ZITHROMAX) 250 MG tablet Take 1 tablet (250 mg total) by mouth as directed. 6 tablet 0   No facility-administered medications prior to visit.       Past Surgical History:  Procedure Laterality Date  . APPENDECTOMY    . arthroscopic knee surgery Right 11-12-04  . AXILLARY ABCESS IRRIGATION AND DEBRIDEMENT  Jul & Aug2012  . CARPAL TUNNEL RELEASE Left 05/23/2014   Procedure: CARPAL TUNNEL RELEASE;  Surgeon: Meredith Pel, MD;  Location: [facility];  Service: Orthopedics;  Laterality: Left;  . cyst removed from top of buttocks  at age 56  . ENDOMETRIAL ABLATION    . TOTAL KNEE ARTHROPLASTY Right 11/15/2014   Procedure: TOTAL RIGHT KNEE ARTHROPLASTY;  Surgeon: Meredith Pel, MD;  Location: [facility];  Service: Orthopedics;  Laterality: Right;  . TOTAL KNEE ARTHROPLASTY Left 07/13/2015   Procedure: LEFT TOTAL KNEE ARTHROPLASTY;  Surgeon: Meredith Pel, MD;  Location: [facility];  Service: Orthopedics;  Laterality: Left;      Past Medical History:  Diagnosis Date  . Allergic rhinitis   . ALLERGIC RHINITIS 10/26/2009  . Anxiety   . ANXIETY 08/14/2006  . B12 DEFICIENCY 08/25/2007  . Confusion   . Depression    takes Lexapro daily  . Diabetes mellitus without complication ([employer])    was on insulin but has been off since Nov 2015 and now only takes Metformin daily  . DYSPNEA 04/28/2009   with exertion  . Esophageal reflux    takes Nexium daily  . Fibromyalgia   . Headache    last migraine 2-82yrs ago;takes Topamax daily  . History of shingles   . Hypertension    takes Coreg daily  .  Insomnia    takes Nortriptyline nightly   . Joint pain   . Joint swelling   . Left knee pain   . Lichen planus   . Long-term memory loss   . Nocturia   . OSA (obstructive sleep apnea)    doesn't use CPAP;sleep study in epic from 2006  . Osteoarthritis   . Osteoarthritis   . Pneumonia    over 30 yrs ago  . Protein calorie malnutrition ([place])   . Rheumatoid arthritis ([place])   . Sarcoidosis    Dr. Lolita Patella  . Short-term memory loss   . Shortness of breath   . TIA (transient ischemic attack)   . Unsteady gait   . Urinary urgency   . Vitamin D deficiency  is supposed to take Vit D but can't afford it  . VITAMIN D DEFICIENCY 08/25/2007      Review of Systems  Constitutional: Negative for chills and fever.  HENT: Positive for congestion. Negative for sinus pain, sinus pressure and sore throat.   Respiratory: Positive for cough and shortness of breath. Negative for chest tightness.   Cardiovascular: Negative for chest pain, palpitations and leg swelling.  Neurological: Positive for headaches.      Objective:    BP 130/72 (BP Location: Right Arm, Patient Position: Sitting, Cuff Size: Large)   Pulse 85   Temp 98.3 F (36.8 C) (Axillary)   Resp 20   Ht 5\' 7"  (1.702 m)   Wt (!) 313 lb (142 kg)   SpO2 96%   BMI 49.02 kg/m  Nursing note and vital signs reviewed.  Physical Exam  Constitutional: She is oriented to person, place, and time. She appears well-developed and well-nourished. No distress.  HENT:  Right Ear: Hearing, tympanic membrane, external ear and ear canal normal.  Left Ear: Hearing, tympanic membrane, external ear and ear canal normal.  Nose: Nose normal. Right sinus exhibits no maxillary sinus tenderness and no frontal sinus tenderness. Left sinus exhibits no maxillary sinus tenderness and no frontal sinus tenderness.  Mouth/Throat: Uvula is midline, oropharynx is clear and moist and mucous membranes are normal.  Cardiovascular: Normal rate, regular rhythm,  normal heart sounds and intact distal pulses.   Pulmonary/Chest: Effort normal. No accessory muscle usage. No respiratory distress. She has decreased breath sounds. She has wheezes. She has no rhonchi. She has no rales.  Neurological: She is alert and oriented to person, place, and time.  Skin: Skin is warm and dry.  Psychiatric: She has a normal mood and affect. Her behavior is normal. Judgment and thought content normal.       Assessment & Plan:   Problem List Items Addressed This Visit      Other   B12 deficiency   Relevant Medications   cyanocobalamin ((VITAMIN B-12)) injection 1,000 mcg (Completed) (Start on 07/11/2016 12:00 PM)   Cough - Primary    New onset cough in the setting of sarcoidosis with concern for exacerbation, bronchitis, or pneumonia. Obtain chest x-ray. Start prednisone taper. Prescribed levofloxacin with instructions to hold pending chest x-ray results. OTC medications as needed for symptom relief and supportive care. Follow up or seek additional care if symptoms worsen or do not improve.       Relevant Orders   DG Chest 2 View       I have discontinued Ms. Boxx's azithromycin. I am also having her start on levofloxacin and predniSONE. Additionally, I am having her maintain her glucose blood, freestyle, FREESTYLE LITE, chlorhexidine, omeprazole, nortriptyline, albuterol, carvedilol, topiramate, KLOR-CON 10, gabapentin, metFORMIN, Adalimumab, nystatin ointment, venlafaxine XR, furosemide, predniSONE, BELSOMRA, diclofenac sodium, and fluocinonide gel. We administered cyanocobalamin.   Meds ordered this encounter  Medications  . levofloxacin (LEVAQUIN) 500 MG tablet    Sig: Take 1 tablet (500 mg total) by mouth daily.    Dispense:  7 tablet    Refill:  0    Order Specific Question:   Supervising Provider    Answer:   Pricilla Holm A [[id number]]  . predniSONE (STERAPRED UNI-PAK 21 TAB) 10 MG (21) TBPK tablet    Sig: Take 6 tablets x 1 day, 5 tablets x 1  day, 4 tablets x 1 day, 3 tablets x 1 day, 2 tablets x 1 day, 1 tablet x 1  day    Dispense:  21 tablet    Refill:  0    Order Specific Question:   Supervising Provider    Answer:   Pricilla Holm A [[id number]]  . cyanocobalamin ((VITAMIN B-12)) injection 1,000 mcg     Follow-up: Return if symptoms worsen or fail to improve.  Mauricio Po, FNP

## 2016-07-11 NOTE — Assessment & Plan Note (Signed)
New onset cough in the setting of sarcoidosis with concern for exacerbation, bronchitis, or pneumonia. Obtain chest x-ray. Start prednisone taper. Prescribed levofloxacin with instructions to hold pending chest x-ray results. OTC medications as needed for symptom relief and supportive care. Follow up or seek additional care if symptoms worsen or do not improve.

## 2016-07-11 NOTE — Patient Instructions (Addendum)
Thank you for choosing [facility].  SUMMARY AND INSTRUCTIONS:  Please stop for a chest x-ray.  Hold off on the antibiotics pending your x-ray results.   Start the prednisone taper.   Robitussin or Delsym as needed for cough.  Medication:  Your prescription(s) have been submitted to your pharmacy or been printed and provided for you. Please take as directed and contact our office if you believe you are having problem(s) with the medication(s) or have any questions.  Follow up:  If your symptoms worsen or fail to improve, please contact our office for further instruction, or in case of emergency go directly to the emergency room at the closest medical facility.

## 2016-07-12 NOTE — Patient Outreach (Signed)
[facility]) Care Management  [place]   07/12/2016  [name] [facility] 23-Sep-1960 [id number]  Subjective: Patient was called regarding medication management per referral from [place].  HIPAA identifiers were obtained.  Patient is a 56 year old female with multiple medical conditions including but not limited to:  CAD, allergic rhinitis, type 2 diabetes, hypertension, GERD, Depression, sarcoidosis, and vitamin D deficiency.  Patient reported using a pill box and managing her medications on her own.  She was agreeable to a pharmacy home visit and said she may be interested in having her medications delivered to her home.  Objective:   Encounter Medications: Outpatient Encounter Prescriptions as of 07/10/2016  Medication Sig Note  . Adalimumab 40 MG/0.8ML PNKT Maintenance dose 40mg  [place] every other week, dx sarcoidosis   . albuterol (PROVENTIL HFA;VENTOLIN HFA) 108 (90 Base) MCG/ACT inhaler Inhale 2 puffs into the lungs every 6 (six) hours as needed for wheezing or shortness of breath.   . BELSOMRA 20 MG TABS TAKE 1 TABLET BY MOUTH EVERY NIGHT AT BEDTIME   . Blood Glucose Monitoring Suppl (FREESTYLE LITE) DEVI Use to check blood sugar 2 times per day   . carvedilol (COREG) 12.5 MG tablet TAKE 1 TABLET BY MOUTH TWICE DAILY WITH A MEAL   . chlorhexidine (PERIDEX) 0.12 % solution Use as directed 5 mLs in the mouth or throat 2 (two) times daily as needed (thrush).    . fluocinonide gel (LIDEX) 8.67 % Apply 1 application topically 2 (two) times daily. Apply a thin ammount to gums 4-6 times per day   . furosemide (LASIX) 20 MG tablet Take 1 tablet (20 mg total) by mouth daily.   [name] gabapentin (NEURONTIN) 300 MG capsule Take 1 capsule (300 mg total) by mouth 2 (two) times daily.   [name] glucose blood (FREESTYLE LITE) test strip TEST TWICE DAILY   . KLOR-CON 10 10 MEQ tablet TAKE 1 TABLET BY MOUTH DAILY   . Lancets (FREESTYLE) lancets 1 each by  Other route 2 (two) times daily. Use as instructed   . metFORMIN (GLUCOPHAGE-XR) 500 MG 24 hr tablet TAKE 2 TABLETS(1000 MG) BY MOUTH DAILY WITH BREAKFAST   . nortriptyline (PAMELOR) 75 MG capsule Take 1 capsule (75 mg total) by mouth at bedtime.   . predniSONE (DELTASONE) 10 MG tablet Take 1 tablet daily with food (Patient taking differently: Take 5 mg by mouth. Take 1 tablet daily with food) 07/10/2016: Dermatologist decreased the dose 07/01/16  . topiramate (TOPAMAX) 50 MG tablet TAKE 1 TABLET BY MOUTH TWICE DAILY   . venlafaxine XR (EFFEXOR XR) 75 MG 24 hr capsule Take 1 capsule (75 mg total) by mouth daily with breakfast.   . [DISCONTINUED] azithromycin (ZITHROMAX) 250 MG tablet Take 1 tablet (250 mg total) by mouth as directed.   . diclofenac sodium (VOLTAREN) 1 % GEL Apply 2 g topically 2 (two) times daily. 07/10/2016: Will go to pick this up on Sunday  . nystatin ointment (MYCOSTATIN) APPLY LIBERALLY QID AA   . omeprazole (PRILOSEC) 20 MG capsule Take 20 mg by mouth daily.   . [DISCONTINUED] clobetasol (TEMOVATE) 0.05 % external solution Apply 1 application topically 2 (two) times daily.    No facility-administered encounter medications on file as of 07/10/2016.     Functional Status: In your present state of health, do you have any difficulty performing the following activities: 07/08/2016 08/17/2015  Hearing? N N  Vision? Y N  Difficulty concentrating or making decisions?  [name]  Walking or climbing stairs? Y Y  Dressing or bathing? Y N  Doing errands, shopping? [name]  Preparing Food and eating ? N -  Using the Toilet? Y -  In the past six months, have you accidently leaked urine? Y -  Do you have problems with loss of bowel control? N -  Managing your Medications? Y -  Managing your Finances? Y -  Housekeeping or managing your Housekeeping? Y -  Some recent data might be hidden    Fall/Depression Screening: Fall Risk  07/08/2016 06/26/2016 04/04/2016  Falls in the past year? Yes Yes  Yes  Number falls in past yr: 2 or more 2 or more 1  Injury with Fall? Yes Yes Yes  Risk Factor Category  High Fall Risk High Fall Risk -  Risk for fall due to : History of fall(s);Impaired balance/gait;Impaired mobility;Mental status change Impaired mobility;Impaired balance/gait;History of fall(s);Mental status change Impaired mobility  Follow up Falls evaluation completed;Education provided;Falls prevention discussed;Follow up appointment Falls evaluation completed;Falls prevention discussed -   PHQ 2/9 Scores 07/08/2016 06/26/2016 04/04/2016  PHQ - 2 Score 1 1 1       Assessment:  Patient's medications were reviewed over the phone.   Drugs sorted by system:  Neurologic/Psychologic: Belsomra Nortriptyline Topiramate Venlafaxine XR   Cardiovascular: Carvedilol Furosemide  Pulmonary/Allergy:  Gastrointestinal: Omeprazole  Endocrine: Metformin  Renal:  Topical:  Pain: Diclofenac Gel Fluocinonide gel Nystatin Prednisone  Vitamins/Minerals: Klon-Con  Infectious Diseases:  Miscellaneous: Chlorhexidine   Patient shared she has to pay someone to go and pick up her medications.  She was briefly educated on using a pharmacy that would deliver to her home and she appeared interested.  Plan:  Route note to PCP Complete home visit

## 2016-07-15 ENCOUNTER — Other Ambulatory Visit: Payer: Self-pay | Admitting: Pharmacist

## 2016-07-15 NOTE — Patient Outreach (Signed)
[facility]) Care Management  [place]   07/15/2016  Jerry Haugen [facility] [birth year] [id number]  Subjective: Home visit completed at patient's home.  Patient is a 56 year old female with multiple medical conditions including but not limited to:  Patient is a 56 year old female with multiple medical conditions including but not limited to:  CAD, allergic rhinitis, type 2 diabetes, hypertension, GERD, Depression, sarcoidosis, and vitamin D deficiency.  Purpose of today's visit was to review medications and explore using a pharmacy that will deliver to the patient's home as she has been paying someone to pick up her medications for her.  Objective:   Encounter Medications: Outpatient Encounter Prescriptions as of 07/15/2016  Medication Sig Note  . Adalimumab 40 MG/0.8ML PNKT Maintenance dose 40mg  [place] every other week, dx sarcoidosis   . albuterol (PROVENTIL HFA;VENTOLIN HFA) 108 (90 Base) MCG/ACT inhaler Inhale 2 puffs into the lungs every 6 (six) hours as needed for wheezing or shortness of breath.   . BELSOMRA 20 MG TABS TAKE 1 TABLET BY MOUTH EVERY NIGHT AT BEDTIME   . Blood Glucose Monitoring Suppl (FREESTYLE LITE) DEVI Use to check blood sugar 2 times per day   . carvedilol (COREG) 12.5 MG tablet TAKE 1 TABLET BY MOUTH TWICE DAILY WITH A MEAL   . chlorhexidine (PERIDEX) 0.12 % solution Use as directed 5 mLs in the mouth or throat 2 (two) times daily as needed (thrush).    . diclofenac sodium (VOLTAREN) 1 % GEL Apply 2 g topically 2 (two) times daily.   [name] esomeprazole (NEXIUM) 40 MG capsule Take 1 capsule by mouth daily before breakfast.   . fluocinonide gel (LIDEX) 9.48 % Apply 1 application topically 2 (two) times daily. Apply a thin ammount to gums 4-6 times per day   . furosemide (LASIX) 20 MG tablet Take 1 tablet (20 mg total) by mouth daily.   [name] gabapentin (NEURONTIN) 300 MG capsule Take 1 capsule (300 mg total) by mouth 2 (two) times daily.   [name] glucose  blood (FREESTYLE LITE) test strip TEST TWICE DAILY   . KLOR-CON 10 10 MEQ tablet TAKE 1 TABLET BY MOUTH DAILY   . Lancets (FREESTYLE) lancets 1 each by Other route 2 (two) times daily. Use as instructed   . lidocaine (XYLOCAINE) 2 % solution Rinse and spit 69ml by mouth two to three times daily as needed.   . metFORMIN (GLUCOPHAGE-XR) 500 MG 24 hr tablet TAKE 2 TABLETS(1000 MG) BY MOUTH DAILY WITH BREAKFAST   . nortriptyline (PAMELOR) 75 MG capsule Take 1 capsule (75 mg total) by mouth at bedtime.   [name] nystatin ointment (MYCOSTATIN) APPLY LIBERALLY QID AA   . predniSONE (DELTASONE) 10 MG tablet Take 1 tablet daily with food (Patient taking differently: Take 5 mg by mouth. Take 1 tablet daily with food) 07/15/2016: Dermatologist decreased the dose to 5mg  07/01/16--patient will resume after finishing the pred taper  . predniSONE (STERAPRED UNI-PAK 21 TAB) 10 MG (21) TBPK tablet Take 6 tablets x 1 day, 5 tablets x 1 day, 4 tablets x 1 day, 3 tablets x 1 day, 2 tablets x 1 day, 1 tablet x 1 day   . topiramate (TOPAMAX) 50 MG tablet TAKE 1 TABLET BY MOUTH TWICE DAILY   . venlafaxine XR (EFFEXOR XR) 75 MG 24 hr capsule Take 1 capsule (75 mg total) by mouth daily with breakfast.   . levofloxacin (LEVAQUIN) 500 MG tablet Take 1 tablet (500 mg total) by mouth daily. (  Patient not taking: Reported on 07/15/2016) 07/15/2016: Patient reported this is "on hold" for now per provider  . [DISCONTINUED] fluticasone (CUTIVATE) 0.005 % ointment Apply 1 application topically 2 (two) times daily.   . [DISCONTINUED] omeprazole (PRILOSEC) 20 MG capsule Take 20 mg by mouth daily.    No facility-administered encounter medications on file as of 07/15/2016.     Functional Status: In your present state of health, do you have any difficulty performing the following activities: 07/08/2016 08/17/2015  Hearing? N N  Vision? Y N  Difficulty concentrating or making decisions? [name]  Walking or climbing stairs? Y Y  Dressing or bathing? Y N   Doing errands, shopping? [name]  Preparing Food and eating ? N -  Using the Toilet? Y -  In the past six months, have you accidently leaked urine? Y -  Do you have problems with loss of bowel control? N -  Managing your Medications? Y -  Managing your Finances? Y -  Housekeeping or managing your Housekeeping? Y -  Some recent data might be hidden    Fall/Depression Screening: Fall Risk  07/08/2016 06/26/2016 04/04/2016  Falls in the past year? Yes Yes Yes  Number falls in past yr: 2 or more 2 or more 1  Injury with Fall? Yes Yes Yes  Risk Factor Category  High Fall Risk High Fall Risk -  Risk for fall due to : History of fall(s);Impaired balance/gait;Impaired mobility;Mental status change Impaired mobility;Impaired balance/gait;History of fall(s);Mental status change Impaired mobility  Follow up Falls evaluation completed;Education provided;Falls prevention discussed;Follow up appointment Falls evaluation completed;Falls prevention discussed -   PHQ 2/9 Scores 07/08/2016 06/26/2016 04/04/2016  PHQ - 2 Score 1 1 1       Assessment:  Patient's medications were reviewed in her home. Patient decided she would like to have her medications delivered to her home.  After reviewing several different local pharmacies that will deliver, patient decided to use [facility].  Each of the patient's providers will be contacted and asked to send new prescriptions to [facility].  Dr. Loanne Drilling: Metformin and Diabetic Supplies  Dr. Alain Marion Nortriptyline Gabapentin (clarification on the need for both nortriptyline and gabapentin) Carvedilol Furosemide Belsomra Klor-Con Topiramate Venlafaxine Esomeprazole Albuterol  Dr. Tamela Oddi Lidocaine Peridex Fluocinonide gel    Plan:  1.  Send notes to PCP and Specialists asking them to send new prescriptions to [facility]  2.  Follow up in 3-5 business days.

## 2016-07-18 ENCOUNTER — Other Ambulatory Visit: Payer: Self-pay

## 2016-07-18 MED ORDER — FREESTYLE LANCETS MISC: 1.0000 | Freq: Two times a day (BID) | 12 refills | Status: DC

## 2016-07-18 MED ORDER — METFORMIN HCL ER 500 MG PO TB24: ORAL_TABLET | ORAL | 0 refills | Status: DC

## 2016-07-18 MED ORDER — GLUCOSE BLOOD VI STRP: ORAL_STRIP | 3 refills | Status: DC

## 2016-07-20 ENCOUNTER — Inpatient Hospital Stay (HOSPITAL_COMMUNITY)
Admission: EM | Admit: 2016-07-20 | Discharge: 2016-07-23 | DRG: 176 | Disposition: A | Discharge status: Skilled Nursing Facility | Payer: Medicare Other | Attending: Family Medicine | Admitting: Family Medicine

## 2016-07-20 ENCOUNTER — Encounter (HOSPITAL_COMMUNITY): Payer: Self-pay | Admitting: Emergency Medicine

## 2016-07-20 DIAGNOSIS — Z7984 Long term (current) use of oral hypoglycemic drugs: Secondary | ICD-10-CM

## 2016-07-20 DIAGNOSIS — G8929 Other chronic pain: Secondary | ICD-10-CM | POA: Diagnosis present

## 2016-07-20 DIAGNOSIS — R51 Headache: Secondary | ICD-10-CM | POA: Diagnosis present

## 2016-07-20 DIAGNOSIS — Z6841 Body Mass Index (BMI) 40.0 and over, adult: Secondary | ICD-10-CM

## 2016-07-20 DIAGNOSIS — R791 Abnormal coagulation profile: Secondary | ICD-10-CM | POA: Diagnosis present

## 2016-07-20 DIAGNOSIS — M7981 Nontraumatic hematoma of soft tissue: Secondary | ICD-10-CM | POA: Diagnosis not present

## 2016-07-20 DIAGNOSIS — Z79899 Other long term (current) drug therapy: Secondary | ICD-10-CM

## 2016-07-20 DIAGNOSIS — Z96653 Presence of artificial knee joint, bilateral: Secondary | ICD-10-CM | POA: Diagnosis present

## 2016-07-20 DIAGNOSIS — I2782 Chronic pulmonary embolism: Secondary | ICD-10-CM | POA: Diagnosis not present

## 2016-07-20 DIAGNOSIS — Z91048 Other nonmedicinal substance allergy status: Secondary | ICD-10-CM

## 2016-07-20 DIAGNOSIS — I2699 Other pulmonary embolism without acute cor pulmonale: Principal | ICD-10-CM | POA: Diagnosis present

## 2016-07-20 DIAGNOSIS — G4700 Insomnia, unspecified: Secondary | ICD-10-CM | POA: Diagnosis present

## 2016-07-20 DIAGNOSIS — I10 Essential (primary) hypertension: Secondary | ICD-10-CM | POA: Diagnosis present

## 2016-07-20 DIAGNOSIS — E1122 Type 2 diabetes mellitus with diabetic chronic kidney disease: Secondary | ICD-10-CM | POA: Diagnosis present

## 2016-07-20 DIAGNOSIS — R52 Pain, unspecified: Secondary | ICD-10-CM | POA: Diagnosis not present

## 2016-07-20 DIAGNOSIS — Z888 Allergy status to other drugs, medicaments and biological substances status: Secondary | ICD-10-CM

## 2016-07-20 DIAGNOSIS — I82411 Acute embolism and thrombosis of right femoral vein: Secondary | ICD-10-CM | POA: Diagnosis present

## 2016-07-20 DIAGNOSIS — L03115 Cellulitis of right lower limb: Secondary | ICD-10-CM

## 2016-07-20 DIAGNOSIS — D863 Sarcoidosis of skin: Secondary | ICD-10-CM | POA: Diagnosis present

## 2016-07-20 DIAGNOSIS — Z86718 Personal history of other venous thrombosis and embolism: Secondary | ICD-10-CM

## 2016-07-20 DIAGNOSIS — I82409 Acute embolism and thrombosis of unspecified deep veins of unspecified lower extremity: Secondary | ICD-10-CM | POA: Diagnosis present

## 2016-07-20 DIAGNOSIS — Z8673 Personal history of transient ischemic attack (TIA), and cerebral infarction without residual deficits: Secondary | ICD-10-CM

## 2016-07-20 DIAGNOSIS — M069 Rheumatoid arthritis, unspecified: Secondary | ICD-10-CM | POA: Diagnosis present

## 2016-07-20 DIAGNOSIS — M797 Fibromyalgia: Secondary | ICD-10-CM | POA: Diagnosis present

## 2016-07-20 DIAGNOSIS — I82421 Acute embolism and thrombosis of right iliac vein: Secondary | ICD-10-CM | POA: Diagnosis present

## 2016-07-20 DIAGNOSIS — Z87891 Personal history of nicotine dependence: Secondary | ICD-10-CM

## 2016-07-20 DIAGNOSIS — M7989 Other specified soft tissue disorders: Secondary | ICD-10-CM

## 2016-07-20 DIAGNOSIS — G4733 Obstructive sleep apnea (adult) (pediatric): Secondary | ICD-10-CM | POA: Diagnosis present

## 2016-07-20 DIAGNOSIS — Z841 Family history of disorders of kidney and ureter: Secondary | ICD-10-CM

## 2016-07-20 DIAGNOSIS — M79604 Pain in right leg: Secondary | ICD-10-CM | POA: Diagnosis not present

## 2016-07-20 DIAGNOSIS — F339 Major depressive disorder, recurrent, unspecified: Secondary | ICD-10-CM | POA: Diagnosis not present

## 2016-07-20 DIAGNOSIS — Z806 Family history of leukemia: Secondary | ICD-10-CM

## 2016-07-20 DIAGNOSIS — E538 Deficiency of other specified B group vitamins: Secondary | ICD-10-CM | POA: Diagnosis present

## 2016-07-20 DIAGNOSIS — R413 Other amnesia: Secondary | ICD-10-CM | POA: Diagnosis present

## 2016-07-20 DIAGNOSIS — Z91040 Latex allergy status: Secondary | ICD-10-CM

## 2016-07-20 DIAGNOSIS — E66813 Obesity, class 3: Secondary | ICD-10-CM | POA: Diagnosis present

## 2016-07-20 DIAGNOSIS — R7989 Other specified abnormal findings of blood chemistry: Secondary | ICD-10-CM | POA: Diagnosis not present

## 2016-07-20 DIAGNOSIS — M545 Low back pain: Secondary | ICD-10-CM

## 2016-07-20 DIAGNOSIS — Z882 Allergy status to sulfonamides status: Secondary | ICD-10-CM

## 2016-07-20 DIAGNOSIS — E559 Vitamin D deficiency, unspecified: Secondary | ICD-10-CM | POA: Diagnosis present

## 2016-07-20 DIAGNOSIS — Z7952 Long term (current) use of systemic steroids: Secondary | ICD-10-CM

## 2016-07-20 DIAGNOSIS — D869 Sarcoidosis, unspecified: Secondary | ICD-10-CM | POA: Diagnosis present

## 2016-07-20 DIAGNOSIS — E1140 Type 2 diabetes mellitus with diabetic neuropathy, unspecified: Secondary | ICD-10-CM | POA: Diagnosis present

## 2016-07-20 DIAGNOSIS — Z8249 Family history of ischemic heart disease and other diseases of the circulatory system: Secondary | ICD-10-CM

## 2016-07-20 DIAGNOSIS — K219 Gastro-esophageal reflux disease without esophagitis: Secondary | ICD-10-CM | POA: Diagnosis present

## 2016-07-20 DIAGNOSIS — Z8619 Personal history of other infectious and parasitic diseases: Secondary | ICD-10-CM

## 2016-07-20 NOTE — ED Triage Notes (Signed)
Brought in by EMS from home with c/o right hip pain that goes down to right foot.  Pt reported that pain and swelling to right hip started last Thursday--- a day after she gave herself "the 4th Humira injection".

## 2016-07-21 ENCOUNTER — Emergency Department (HOSPITAL_COMMUNITY): Payer: Medicare Other

## 2016-07-21 ENCOUNTER — Observation Stay (HOSPITAL_COMMUNITY)
Admit: 2016-07-21 | Discharge: 2016-07-21 | Disposition: A | Discharge status: Home / Self Care | Payer: Medicare Other | Attending: Emergency Medicine | Admitting: Emergency Medicine

## 2016-07-21 ENCOUNTER — Encounter (HOSPITAL_COMMUNITY): Payer: Self-pay | Admitting: Radiology

## 2016-07-21 DIAGNOSIS — G8929 Other chronic pain: Secondary | ICD-10-CM | POA: Diagnosis present

## 2016-07-21 DIAGNOSIS — M7981 Nontraumatic hematoma of soft tissue: Secondary | ICD-10-CM | POA: Diagnosis not present

## 2016-07-21 DIAGNOSIS — G4733 Obstructive sleep apnea (adult) (pediatric): Secondary | ICD-10-CM | POA: Diagnosis present

## 2016-07-21 DIAGNOSIS — I82421 Acute embolism and thrombosis of right iliac vein: Secondary | ICD-10-CM | POA: Diagnosis present

## 2016-07-21 DIAGNOSIS — Z87891 Personal history of nicotine dependence: Secondary | ICD-10-CM | POA: Diagnosis not present

## 2016-07-21 DIAGNOSIS — M6281 Muscle weakness (generalized): Secondary | ICD-10-CM | POA: Diagnosis not present

## 2016-07-21 DIAGNOSIS — M797 Fibromyalgia: Secondary | ICD-10-CM | POA: Diagnosis present

## 2016-07-21 DIAGNOSIS — M069 Rheumatoid arthritis, unspecified: Secondary | ICD-10-CM | POA: Diagnosis present

## 2016-07-21 DIAGNOSIS — I82409 Acute embolism and thrombosis of unspecified deep veins of unspecified lower extremity: Secondary | ICD-10-CM | POA: Diagnosis present

## 2016-07-21 DIAGNOSIS — I2699 Other pulmonary embolism without acute cor pulmonale: Secondary | ICD-10-CM | POA: Diagnosis present

## 2016-07-21 DIAGNOSIS — Z86711 Personal history of pulmonary embolism: Secondary | ICD-10-CM

## 2016-07-21 DIAGNOSIS — I10 Essential (primary) hypertension: Secondary | ICD-10-CM | POA: Diagnosis present

## 2016-07-21 DIAGNOSIS — Z8673 Personal history of transient ischemic attack (TIA), and cerebral infarction without residual deficits: Secondary | ICD-10-CM | POA: Diagnosis not present

## 2016-07-21 DIAGNOSIS — R791 Abnormal coagulation profile: Secondary | ICD-10-CM | POA: Diagnosis present

## 2016-07-21 DIAGNOSIS — K219 Gastro-esophageal reflux disease without esophagitis: Secondary | ICD-10-CM | POA: Diagnosis present

## 2016-07-21 DIAGNOSIS — I82491 Acute embolism and thrombosis of other specified deep vein of right lower extremity: Secondary | ICD-10-CM | POA: Diagnosis not present

## 2016-07-21 DIAGNOSIS — E1140 Type 2 diabetes mellitus with diabetic neuropathy, unspecified: Secondary | ICD-10-CM | POA: Diagnosis present

## 2016-07-21 DIAGNOSIS — R51 Headache: Secondary | ICD-10-CM | POA: Diagnosis present

## 2016-07-21 DIAGNOSIS — Z96653 Presence of artificial knee joint, bilateral: Secondary | ICD-10-CM | POA: Diagnosis present

## 2016-07-21 DIAGNOSIS — Z6841 Body Mass Index (BMI) 40.0 and over, adult: Secondary | ICD-10-CM | POA: Diagnosis not present

## 2016-07-21 DIAGNOSIS — R7989 Other specified abnormal findings of blood chemistry: Secondary | ICD-10-CM | POA: Diagnosis not present

## 2016-07-21 DIAGNOSIS — R2681 Unsteadiness on feet: Secondary | ICD-10-CM | POA: Diagnosis not present

## 2016-07-21 DIAGNOSIS — R2689 Other abnormalities of gait and mobility: Secondary | ICD-10-CM | POA: Diagnosis not present

## 2016-07-21 DIAGNOSIS — M79604 Pain in right leg: Secondary | ICD-10-CM | POA: Diagnosis not present

## 2016-07-21 DIAGNOSIS — D863 Sarcoidosis of skin: Secondary | ICD-10-CM | POA: Diagnosis present

## 2016-07-21 DIAGNOSIS — E538 Deficiency of other specified B group vitamins: Secondary | ICD-10-CM | POA: Diagnosis present

## 2016-07-21 DIAGNOSIS — Z806 Family history of leukemia: Secondary | ICD-10-CM | POA: Diagnosis not present

## 2016-07-21 DIAGNOSIS — Z86718 Personal history of other venous thrombosis and embolism: Secondary | ICD-10-CM | POA: Diagnosis not present

## 2016-07-21 DIAGNOSIS — I999 Unspecified disorder of circulatory system: Secondary | ICD-10-CM | POA: Diagnosis not present

## 2016-07-21 DIAGNOSIS — Z8249 Family history of ischemic heart disease and other diseases of the circulatory system: Secondary | ICD-10-CM | POA: Diagnosis not present

## 2016-07-21 DIAGNOSIS — I82411 Acute embolism and thrombosis of right femoral vein: Secondary | ICD-10-CM | POA: Diagnosis present

## 2016-07-21 DIAGNOSIS — F339 Major depressive disorder, recurrent, unspecified: Secondary | ICD-10-CM | POA: Diagnosis present

## 2016-07-21 LAB — PROTIME-INR
INR: 1.02
Prothrombin Time: 13.4 seconds (ref 11.4–15.2)

## 2016-07-21 LAB — CBC WITH DIFFERENTIAL/PLATELET
BASOS ABS: 0 10*3/uL (ref 0.0–0.1)
BASOS PCT: 0 %
EOS ABS: 0.4 10*3/uL (ref 0.0–0.7)
Eosinophils Relative: 3 %
HEMATOCRIT: 38.4 % (ref 36.0–46.0)
Hemoglobin: 12.4 g/dL (ref 12.0–15.0)
Lymphocytes Relative: 20 %
Lymphs Abs: 2.7 10*3/uL (ref 0.7–4.0)
MCH: 29.8 pg (ref 26.0–34.0)
MCHC: 32.3 g/dL (ref 30.0–36.0)
MCV: 92.3 fL (ref 78.0–100.0)
MONO ABS: 1.1 10*3/uL — AB (ref 0.1–1.0)
Monocytes Relative: 8 %
NEUTROS PCT: 69 %
Neutro Abs: 9.5 10*3/uL — ABNORMAL HIGH (ref 1.7–7.7)
Platelets: 178 10*3/uL (ref 150–400)
RBC: 4.16 MIL/uL (ref 3.87–5.11)
RDW: 14.2 % (ref 11.5–15.5)
WBC: 13.7 10*3/uL — ABNORMAL HIGH (ref 4.0–10.5)

## 2016-07-21 LAB — COMPREHENSIVE METABOLIC PANEL
ALBUMIN: 2.9 g/dL — AB (ref 3.5–5.0)
ALT: 17 U/L (ref 14–54)
ANION GAP: 9 (ref 5–15)
AST: 15 U/L (ref 15–41)
Alkaline Phosphatase: 64 U/L (ref 38–126)
BILIRUBIN TOTAL: 0.5 mg/dL (ref 0.3–1.2)
BUN: 23 mg/dL — AB (ref 6–20)
CHLORIDE: 104 mmol/L (ref 101–111)
CO2: 24 mmol/L (ref 22–32)
Calcium: 8.7 mg/dL — ABNORMAL LOW (ref 8.9–10.3)
Creatinine, Ser: 1.22 mg/dL — ABNORMAL HIGH (ref 0.44–1.00)
GFR calc Af Amer: 57 mL/min — ABNORMAL LOW (ref >60)
GFR calc non Af Amer: 49 mL/min — ABNORMAL LOW (ref >60)
GLUCOSE: 154 mg/dL — AB (ref 65–99)
POTASSIUM: 3.5 mmol/L (ref 3.5–5.1)
SODIUM: 137 mmol/L (ref 135–145)
TOTAL PROTEIN: 6.7 g/dL (ref 6.5–8.1)

## 2016-07-21 LAB — I-STAT CG4 LACTIC ACID, ED: LACTIC ACID, VENOUS: 0.98 mmol/L (ref 0.5–1.9)

## 2016-07-21 LAB — GLUCOSE, CAPILLARY
GLUCOSE-CAPILLARY: 197 mg/dL — AB (ref 65–99)
Glucose-Capillary: 196 mg/dL — ABNORMAL HIGH (ref 65–99)
Glucose-Capillary: 205 mg/dL — ABNORMAL HIGH (ref 65–99)

## 2016-07-21 LAB — D-DIMER, QUANTITATIVE (NOT AT ARMC): D DIMER QUANT: 9.5 ug{FEU}/mL — AB (ref 0.00–0.50)

## 2016-07-21 MED ORDER — CEFAZOLIN SODIUM-DEXTROSE 1-4 GM/50ML-% IV SOLN
1.0000 g | Freq: Three times a day (TID) | INTRAVENOUS | Status: DC
  Administered 2016-07-21 – 2016-07-22 (×3): 1 g via INTRAVENOUS
  Filled 2016-07-21 (×4): qty 50

## 2016-07-21 MED ORDER — HEPARIN (PORCINE) IN NACL 100-0.45 UNIT/ML-% IJ SOLN
1700.0000 [IU]/h | INTRAMUSCULAR | Status: DC
  Administered 2016-07-21: 1700 [IU]/h via INTRAVENOUS
  Filled 2016-07-21: qty 250

## 2016-07-21 MED ORDER — GABAPENTIN 300 MG PO CAPS
300.0000 mg | ORAL_CAPSULE | Freq: Two times a day (BID) | ORAL | Status: DC
  Administered 2016-07-21 – 2016-07-23 (×5): 300 mg via ORAL
  Filled 2016-07-21 (×4): qty 1

## 2016-07-21 MED ORDER — SODIUM CHLORIDE 0.9 % IV BOLUS (SEPSIS)
1000.0000 mL | Freq: Once | INTRAVENOUS | Status: AC
  Administered 2016-07-21: 1000 mL via INTRAVENOUS

## 2016-07-21 MED ORDER — METHOCARBAMOL 500 MG PO TABS
500.0000 mg | ORAL_TABLET | Freq: Three times a day (TID) | ORAL | Status: DC | PRN
  Administered 2016-07-21 – 2016-07-22 (×2): 500 mg via ORAL
  Filled 2016-07-21 (×3): qty 1

## 2016-07-21 MED ORDER — ONDANSETRON HCL 4 MG PO TABS: 4.0000 mg | ORAL_TABLET | Freq: Four times a day (QID) | ORAL | Status: DC | PRN

## 2016-07-21 MED ORDER — INSULIN ASPART 100 UNIT/ML ~~LOC~~ SOLN
0.0000 [IU] | Freq: Three times a day (TID) | SUBCUTANEOUS | Status: DC
  Administered 2016-07-21 (×2): 3 [IU] via SUBCUTANEOUS
  Administered 2016-07-22 (×2): 1 [IU] via SUBCUTANEOUS
  Administered 2016-07-22: 3 [IU] via SUBCUTANEOUS
  Administered 2016-07-23: 1 [IU] via SUBCUTANEOUS
  Administered 2016-07-23: 2 [IU] via SUBCUTANEOUS

## 2016-07-21 MED ORDER — SUVOREXANT 20 MG PO TABS: 1.0000 | ORAL_TABLET | Freq: Every day | ORAL | Status: DC

## 2016-07-21 MED ORDER — ONDANSETRON HCL 4 MG/2ML IJ SOLN: 4.0000 mg | Freq: Four times a day (QID) | INTRAMUSCULAR | Status: DC | PRN

## 2016-07-21 MED ORDER — MORPHINE SULFATE (PF) 2 MG/ML IV SOLN
4.0000 mg | Freq: Once | INTRAVENOUS | Status: AC
  Administered 2016-07-21: 4 mg via INTRAVENOUS
  Filled 2016-07-21: qty 2

## 2016-07-21 MED ORDER — ENOXAPARIN SODIUM 150 MG/ML ~~LOC~~ SOLN
1.0000 mg/kg | Freq: Once | SUBCUTANEOUS | Status: AC
  Administered 2016-07-21: 145 mg via SUBCUTANEOUS
  Filled 2016-07-21: qty 0.95

## 2016-07-21 MED ORDER — VENLAFAXINE HCL ER 75 MG PO CP24
75.0000 mg | ORAL_CAPSULE | Freq: Every day | ORAL | Status: DC
  Administered 2016-07-22 – 2016-07-23 (×2): 75 mg via ORAL
  Filled 2016-07-21 (×2): qty 1

## 2016-07-21 MED ORDER — RIVAROXABAN 15 MG PO TABS: 15.0000 mg | ORAL_TABLET | Freq: Two times a day (BID) | ORAL | Status: DC

## 2016-07-21 MED ORDER — CARVEDILOL 6.25 MG PO TABS
6.2500 mg | ORAL_TABLET | Freq: Two times a day (BID) | ORAL | Status: DC
  Administered 2016-07-21 – 2016-07-23 (×4): 6.25 mg via ORAL
  Filled 2016-07-21 (×4): qty 1

## 2016-07-21 MED ORDER — ACETAMINOPHEN 650 MG RE SUPP: 650.0000 mg | Freq: Four times a day (QID) | RECTAL | Status: DC | PRN

## 2016-07-21 MED ORDER — SODIUM CHLORIDE 0.9% FLUSH
3.0000 mL | Freq: Two times a day (BID) | INTRAVENOUS | Status: DC
  Administered 2016-07-21 – 2016-07-22 (×3): 3 mL via INTRAVENOUS

## 2016-07-21 MED ORDER — IOPAMIDOL (ISOVUE-370) INJECTION 76%
INTRAVENOUS | Status: AC
  Filled 2016-07-21: qty 100

## 2016-07-21 MED ORDER — ACETAMINOPHEN 325 MG PO TABS
650.0000 mg | ORAL_TABLET | Freq: Four times a day (QID) | ORAL | Status: DC | PRN
  Administered 2016-07-21 – 2016-07-23 (×5): 650 mg via ORAL
  Filled 2016-07-21 (×5): qty 2

## 2016-07-21 MED ORDER — RIVAROXABAN 20 MG PO TABS: 20.0000 mg | ORAL_TABLET | Freq: Every day | ORAL | Status: DC

## 2016-07-21 MED ORDER — ONDANSETRON HCL 4 MG/2ML IJ SOLN
4.0000 mg | Freq: Once | INTRAMUSCULAR | Status: AC
  Administered 2016-07-21: 4 mg via INTRAVENOUS
  Filled 2016-07-21: qty 2

## 2016-07-21 MED ORDER — NORTRIPTYLINE HCL 25 MG PO CAPS
75.0000 mg | ORAL_CAPSULE | Freq: Every day | ORAL | Status: DC
  Administered 2016-07-21 – 2016-07-22 (×2): 75 mg via ORAL
  Filled 2016-07-21 (×2): qty 3

## 2016-07-21 MED ORDER — INSULIN ASPART 100 UNIT/ML ~~LOC~~ SOLN
0.0000 [IU] | Freq: Every day | SUBCUTANEOUS | Status: DC
  Administered 2016-07-21: 2 [IU] via SUBCUTANEOUS

## 2016-07-21 MED ORDER — PANTOPRAZOLE SODIUM 40 MG PO TBEC
40.0000 mg | DELAYED_RELEASE_TABLET | Freq: Every day | ORAL | Status: DC
  Administered 2016-07-21 – 2016-07-23 (×3): 40 mg via ORAL
  Filled 2016-07-21 (×3): qty 1

## 2016-07-21 MED ORDER — ALBUTEROL SULFATE (2.5 MG/3ML) 0.083% IN NEBU
3.0000 mL | INHALATION_SOLUTION | Freq: Four times a day (QID) | RESPIRATORY_TRACT | Status: DC | PRN
Start: 2016-07-21 — End: 2016-07-22

## 2016-07-21 MED ORDER — ALBUTEROL SULFATE (2.5 MG/3ML) 0.083% IN NEBU: 2.5000 mg | INHALATION_SOLUTION | RESPIRATORY_TRACT | Status: DC | PRN

## 2016-07-21 MED ORDER — CEFAZOLIN SODIUM-DEXTROSE 2-4 GM/100ML-% IV SOLN
2.0000 g | Freq: Once | INTRAVENOUS | Status: AC
  Administered 2016-07-21: 2 g via INTRAVENOUS
  Filled 2016-07-21: qty 100

## 2016-07-21 MED ORDER — IOPAMIDOL (ISOVUE-370) INJECTION 76%
100.0000 mL | Freq: Once | INTRAVENOUS | Status: AC | PRN
  Administered 2016-07-21: 100 mL via INTRAVENOUS

## 2016-07-21 MED ORDER — PREDNISONE 5 MG PO TABS
5.0000 mg | ORAL_TABLET | Freq: Every day | ORAL | Status: DC
  Administered 2016-07-22 – 2016-07-23 (×2): 5 mg via ORAL
  Filled 2016-07-21 (×2): qty 1

## 2016-07-21 MED ORDER — TRAMADOL HCL 50 MG PO TABS
50.0000 mg | ORAL_TABLET | Freq: Four times a day (QID) | ORAL | Status: DC | PRN
  Administered 2016-07-21 – 2016-07-22 (×3): 50 mg via ORAL
  Filled 2016-07-21 (×3): qty 1

## 2016-07-21 MED ORDER — TOPIRAMATE 25 MG PO TABS
50.0000 mg | ORAL_TABLET | Freq: Two times a day (BID) | ORAL | Status: DC
Start: 2016-07-21 — End: 2016-07-23
  Administered 2016-07-21 – 2016-07-23 (×5): 50 mg via ORAL
  Filled 2016-07-21 (×5): qty 2

## 2016-07-21 NOTE — Discharge Instructions (Addendum)
Information on my medicine - XARELTO (rivaroxaban)  This medication education was reviewed with me or my healthcare representative as part of my discharge preparation.  The pharmacist that spoke with me during my hospital stay was:  Hershal Coria, [place]? Xarelto was prescribed to treat blood clots that may have been found in the veins of your legs (deep vein thrombosis) or in your lungs (pulmonary embolism) and to reduce the risk of them occurring again.  What do you need to know about Xarelto? The starting dose is one 15 mg tablet taken TWICE daily with food for the FIRST 21 DAYS then on (enter date)  08/13/16  the dose is changed to one 20 mg tablet taken ONCE A DAY with your evening meal.  DO NOT stop taking Xarelto without talking to the health care provider who prescribed the medication.  Refill your prescription for 20 mg tablets before you run out.  After discharge, you should have regular check-up appointments with your healthcare provider that is prescribing your Xarelto.  In the future your dose may need to be changed if your kidney function changes by a significant amount.  What do you do if you miss a dose? If you are taking Xarelto TWICE DAILY and you miss a dose, take it as soon as you remember. You may take two 15 mg tablets (total 30 mg) at the same time then resume your regularly scheduled 15 mg twice daily the next day.  If you are taking Xarelto ONCE DAILY and you miss a dose, take it as soon as you remember on the same day then continue your regularly scheduled once daily regimen the next day. Do not take two doses of Xarelto at the same time.   Important Safety Information Xarelto is a blood thinner medicine that can cause bleeding. You should call your healthcare provider right away if you experience any of the following: ? Bleeding from an injury or your nose that does not stop. ? Unusual colored urine (red or dark brown) or  unusual colored stools (red or black). ? Unusual bruising for unknown reasons. ? A serious fall or if you hit your head (even if there is no bleeding).  Some medicines may interact with Xarelto and might increase your risk of bleeding while on Xarelto. To help avoid this, consult your healthcare provider or pharmacist prior to using any new prescription or non-prescription medications, including herbals, vitamins, non-steroidal anti-inflammatory drugs (NSAIDs) and supplements.  This website has more information on Xarelto: [email or web address]

## 2016-07-21 NOTE — H&P (Signed)
[facility] History and Physical   Patient: [name] NOB:[id number]   PCP: Cassandria Anger, MD DOB: [birth year]   DOA: 07/20/2016   DOS: 07/21/2016   DOS: the patient was seen and examined on 07/21/2016  Patient coming from: The patient is coming from home.  Chief Complaint: Right leg pain and edema  HPI: [name] is a 56 y.o. female with Past medical history of sarcoidosis, on Humira, morbid obesity, type 2 diabetes mellitus, depression, B 12 deficiency, OSA not on C Pap. Patient presented with complaints of right leg swelling. She mentions that she injected herself Humira on the right thigh and the next day when she woke up she noted that there was swelling involving the right thigh. This progressively worsened over next 3 days and started having severe pain as well as redness and therefore she came to the hospital. She denies having any complaints of fever or chills. No chest pain or shortness of breath. No nausea no vomiting no abdominal pain. Patient has history of DVT after her surgery of the knee. Patient was placed on Xarelto last year and just completed the treatment course in April 2018. Follows up with pulmonary here as well as immunology outside he did Recently placed on Humira and this was her fourth dose of Humira. On chronically steroid.  ED Course: Presented with right leg pain. D-dimer was elevated. CT scan was positive for chronic appearing subsegmental PE. Patient was given one dose of Lovenox and was referred of admission.  At her baseline ambulates with support And is independent for most of her ADL; manages her medication on her own.  Review of Systems: as mentioned in the history of present illness.  All other systems reviewed and are negative.  Past Medical History:  Diagnosis Date  . Allergic rhinitis   . ALLERGIC RHINITIS 10/26/2009  . Anxiety   . ANXIETY 08/14/2006  . B12 DEFICIENCY 08/25/2007  . Confusion   . Depression    takes Lexapro daily  . Diabetes mellitus without complication ([place])    was on insulin but has been off since Nov 2015 and now only takes Metformin daily  . DYSPNEA 04/28/2009   with exertion  . Esophageal reflux    takes Nexium daily  . Fibromyalgia   . Headache    last migraine 2-2yrs ago;takes Topamax daily  . History of shingles   . Hypertension    takes Coreg daily  . Insomnia    takes Nortriptyline nightly   . Joint pain   . Joint swelling   . Left knee pain   . Lichen planus   . Long-term memory loss   . Nocturia   . OSA (obstructive sleep apnea)    doesn't use CPAP;sleep study in epic from 2006  . Osteoarthritis   . Osteoarthritis   . Pneumonia    over 30 yrs ago  . Protein calorie malnutrition ([place])   . Rheumatoid arthritis ([place])   . Sarcoidosis    Dr. Lolita Patella  . Short-term memory loss   . Shortness of breath   . TIA (transient ischemic attack)   . Unsteady gait   . Urinary urgency   . Vitamin D deficiency    is supposed to take Vit D but can't afford it  . VITAMIN D DEFICIENCY 08/25/2007   Past Surgical History:  Procedure Laterality Date  . APPENDECTOMY    . arthroscopic knee surgery Right 11-12-04  . AXILLARY ABCESS IRRIGATION AND DEBRIDEMENT  Jul &  [id number]  . CARPAL TUNNEL RELEASE Left 05/23/2014   Procedure: CARPAL TUNNEL RELEASE;  Surgeon: Meredith Pel, MD;  Location: [place];  Service: Orthopedics;  Laterality: Left;  . cyst removed from top of buttocks  at age 19  . ENDOMETRIAL ABLATION    . TOTAL KNEE ARTHROPLASTY Right 11/15/2014   Procedure: TOTAL RIGHT KNEE ARTHROPLASTY;  Surgeon: Meredith Pel, MD;  Location: [place];  Service: Orthopedics;  Laterality: Right;  . TOTAL KNEE ARTHROPLASTY Left 07/13/2015   Procedure: LEFT TOTAL KNEE ARTHROPLASTY;  Surgeon: Meredith Pel, MD;  Location: [place];  Service: Orthopedics;  Laterality: Left;   Social History:  reports that she has quit smoking. Her smoking use included Cigarettes. She has a 5.00  pack-year smoking history. She has never used smokeless tobacco. She reports that she does not drink alcohol or use drugs.  Allergies  Allergen Reactions  . Enalapril Maleate Cough  . Hydrochlorothiazide Other (See Comments)    Reaction:  Low potassium levels   . Hydroxychloroquine Sulfate Other (See Comments)    Reaction:  Vision changes   . Latex Hives  . Nickel Other (See Comments)    Reaction:  Blisters      Family History  Problem Relation Age of Onset  . Heart disease Mother   . Kidney disease Mother   . Cancer Father        leukemia  . Hypertension Unknown   . Coronary artery disease Unknown        female 1st degree relative <60  . Heart failure Unknown        congestive  . Coronary artery disease Unknown        Female 1st degree relative <50  . Breast cancer Unknown        1st degree relative <50 S     Prior to Admission medications   Medication Sig Start Date End Date Taking? Authorizing Provider  Adalimumab (HUMIRA) 40 MG/0.8ML PSKT Inject 40 mg into the skin every 14 (fourteen) days.   Yes [provider]  albuterol (PROVENTIL HFA;VENTOLIN HFA) 108 (90 Base) MCG/ACT inhaler Inhale 2 puffs into the lungs every 6 (six) hours as needed for wheezing or shortness of breath. 01/30/16  Yes Parrett, Tammy S, NP  BELSOMRA 20 MG TABS TAKE 1 TABLET BY MOUTH EVERY NIGHT AT BEDTIME 06/27/16  Yes Plotnikov, Evie Lacks, MD  carvedilol (COREG) 12.5 MG tablet TAKE 1 TABLET BY MOUTH TWICE DAILY WITH A MEAL 02/09/16  Yes Plotnikov, Evie Lacks, MD  chlorhexidine (PERIDEX) 0.12 % solution Use as directed 5 mLs in the mouth or throat 2 (two) times daily. Pt is to swish and spit.   Yes [provider]  diclofenac sodium (VOLTAREN) 1 % GEL Apply 2 g topically 2 (two) times daily. Patient taking differently: Apply 2 g topically 2 (two) times daily as needed (for pain).  07/08/16  Yes Meredith Pel, MD  esomeprazole (NEXIUM) 40 MG capsule Take 40 mg by mouth daily before  breakfast.    Yes Plotnikov, Evie Lacks, MD  fluocinonide gel (LIDEX) 8.92 % Apply 1 application topically 2 (two) times daily as needed (for sores on gums).    Yes Strawberry, [place], DDS  furosemide (LASIX) 20 MG tablet Take 1 tablet (20 mg total) by mouth daily. Patient taking differently: Take 20 mg by mouth at bedtime.  06/07/16  Yes Plotnikov, Evie Lacks, MD  gabapentin (NEURONTIN) 300 MG capsule Take 1 capsule (300 mg total) by mouth  2 (two) times daily. Patient taking differently: Take 300 mg by mouth 2 (two) times daily as needed (for pain).  04/09/16  Yes Plotnikov, Evie Lacks, MD  KLOR-CON 10 10 MEQ tablet TAKE 1 TABLET BY MOUTH DAILY 02/12/16  Yes Plotnikov, Evie Lacks, MD  lidocaine (XYLOCAINE) 2 % solution Use as directed 20 mLs in the mouth or throat 3 (three) times daily as needed for mouth pain. Pt is to swish and spit.   Yes [place], [place], DDS  metFORMIN (GLUCOPHAGE-XR) 500 MG 24 hr tablet TAKE 2 TABLETS(1000 MG) BY MOUTH DAILY WITH BREAKFAST Patient taking differently: Take 1,000 mg by mouth daily with breakfast.  07/18/16  Yes Renato Shin, MD  nortriptyline (PAMELOR) 75 MG capsule Take 1 capsule (75 mg total) by mouth at bedtime. 01/24/16  Yes Plotnikov, Evie Lacks, MD  predniSONE (DELTASONE) 5 MG tablet Take 5 mg by mouth daily with breakfast.   Yes [provider]  topiramate (TOPAMAX) 50 MG tablet TAKE 1 TABLET BY MOUTH TWICE DAILY 02/09/16  Yes Plotnikov, Evie Lacks, MD  venlafaxine XR (EFFEXOR XR) 75 MG 24 hr capsule Take 1 capsule (75 mg total) by mouth daily with breakfast. 06/07/16  Yes Plotnikov, Evie Lacks, MD    Physical Exam: Vitals:   07/21/16 0635 07/21/16 0730 07/21/16 0828 07/21/16 1500  BP: 108/73 115/77 101/64 (!) 102/56  Pulse: 94  92 88  Resp: 18 15  20   Temp:    98.2 F (36.8 C)  TempSrc:   Oral Oral  SpO2: 93%  95% 95%  Weight:   (!) 148.1 kg (326 lb 8 oz)   Height:   5\' 7"  (1.702 m)     General: Alert, Awake and Oriented to Time, Place and  Person. Appear in moderate distress, affect appropriate Eyes: PERRL, Conjunctiva normal ENT: Oral Mucosa clear moist. Neck: no JVD, no Abnormal Mass Or lumps Cardiovascular: S1 and S2 Present, no Murmur, Peripheral Pulses Present Respiratory: normal respiratory effort, Bilateral Air entry equal and Decreased, no use of accessory muscle, basal Crackles, no wheezes Abdomen: Bowel Sound present, Soft and no tenderness, no hernia Skin: right leg redness, no Rash, right leg induration Extremities: right leg>left  Pedal edema, right calf tenderness Neurologic: Grossly no focal neuro deficit. Bilaterally Equal motor strength  Labs on Admission:  CBC:  Recent Labs Lab 07/21/16 0212  WBC 13.7*  NEUTROABS 9.5*  HGB 12.4  HCT 38.4  MCV 92.3  PLT 614   Basic Metabolic Panel:  Recent Labs Lab 07/21/16 0212  NA 137  K 3.5  CL 104  CO2 24  GLUCOSE 154*  BUN 23*  CREATININE 1.22*  CALCIUM 8.7*   GFR: Estimated Creatinine Clearance: 79.1 mL/min (A) (by C-G formula based on SCr of 1.22 mg/dL (H)). Liver Function Tests:  Recent Labs Lab 07/21/16 0212  AST 15  ALT 17  ALKPHOS 64  BILITOT 0.5  PROT 6.7  ALBUMIN 2.9*   No results for input(s): LIPASE, AMYLASE in the last 168 hours. No results for input(s): AMMONIA in the last 168 hours. Coagulation Profile:  Recent Labs Lab 07/21/16 0212  INR 1.02   Cardiac Enzymes: No results for input(s): CKTOTAL, CKMB, CKMBINDEX, TROPONINI in the last 168 hours. BNP (last 3 results) No results for input(s): PROBNP in the last 8760 hours. HbA1C: No results for input(s): HGBA1C in the last 72 hours. CBG:  Recent Labs Lab 07/21/16 1028 07/21/16 1652  GLUCAP 197* 196*   Lipid Profile: No results for input(s): CHOL,  HDL, LDLCALC, TRIG, CHOLHDL, LDLDIRECT in the last 72 hours. Thyroid Function Tests: No results for input(s): TSH, T4TOTAL, FREET4, T3FREE, THYROIDAB in the last 72 hours. Anemia Panel: No results for input(s):  VITAMINB12, FOLATE, FERRITIN, TIBC, IRON, RETICCTPCT in the last 72 hours. Urine analysis:    Component Value Date/Time   COLORURINE YELLOW 08/18/2015 0558   APPEARANCEUR CLOUDY (A) 08/18/2015 0558   LABSPEC 1.020 08/18/2015 0558   PHURINE 5.5 08/18/2015 0558   GLUCOSEU NEGATIVE 08/18/2015 0558   GLUCOSEU NEGATIVE 04/27/2013 1218   HGBUR NEGATIVE 08/18/2015 0558   BILIRUBINUR NEGATIVE 08/18/2015 0558   KETONESUR NEGATIVE 08/18/2015 0558   PROTEINUR NEGATIVE 08/18/2015 0558   UROBILINOGEN 1.0 11/08/2014 1050   NITRITE NEGATIVE 08/18/2015 0558   LEUKOCYTESUR NEGATIVE 08/18/2015 0558    Radiological Exams on Admission: Ct Angio Chest Pe W And/or Wo Contrast  Result Date: 07/21/2016 CLINICAL DATA:  Acute onset of right hip pain, extending to the foot, with soft tissue swelling. Elevated D-dimer. Current history of sarcoidosis. Initial encounter. EXAM: CT ANGIOGRAPHY CHEST WITH CONTRAST TECHNIQUE: Multidetector CT imaging of the chest was performed using the standard protocol during bolus administration of intravenous contrast. Multiplanar CT image reconstructions and MIPs were obtained to evaluate the vascular anatomy. CONTRAST:  100 mL of Isovue 370 IV contrast COMPARISON:  Chest radiograph performed 07/11/2016, and CT of the chest performed 02/02/2013 FINDINGS: Cardiovascular: There is a small chronic appearing segmental pulmonary embolus within a pulmonary artery branch to the right lower lobe. There is apparent mild stricturing of pulmonary arterial and venous structures at the hila bilaterally, likely corresponding to the patient's sarcoidosis. Emptying of the pulmonary veins is difficult to fully assess at the hila. Diffuse coronary artery calcifications are seen. The heart remains normal in size. Mild calcification is noted along the aortic arch. The great vessels are unremarkable in appearance. Mediastinum/Nodes: Numerous calcified mediastinal and hilar nodes are noted, measuring up to 1.7  cm in short axis. No pericardial effusion is identified. The visualized portions of the thyroid gland are unremarkable. No axillary lymphadenopathy is seen. Lungs/Pleura: Scattered bilateral pulmonary nodules and mild posterior right upper lobe scarring is noted, likely corresponding to the patient's sarcoidosis. No pleural effusion or pneumothorax is seen. Evaluation for mass is limited given the patient's underlying sarcoidosis. Upper Abdomen: The visualized portions of the liver and spleen are grossly unremarkable. The visualized portions of the gallbladder, pancreas, adrenal glands and kidneys are within normal limits. Musculoskeletal: No acute osseous abnormalities are identified. The visualized musculature is unremarkable in appearance. Review of the MIP images confirms the above findings. IMPRESSION: 1. Small chronic appearing segmental pulmonary embolus within a pulmonary artery branch to the right lower lung lobe. 2. Apparent mild stricturing of pulmonary arterial and venous structures at the hila bilaterally, likely corresponding to the patient's sarcoidosis. Diffuse surrounding chronic calcified mediastinal and hilar lymphadenopathy. 3. Scattered bilateral pulmonary nodules and mild posterior right upper lobe scarring, likely reflecting the patient's sarcoidosis. 4. Diffuse coronary artery calcifications noted. These results were called by telephone at the time of interpretation on 07/21/2016 at 5:37 am to Dr. Duffy Bruce, who verbally acknowledged these results. Electronically Signed   By: Garald Balding M.D.   On: 07/21/2016 05:39    Assessment/Plan 1. Pulmonary embolism. Right lower leg occlusive DVT. Presents with worsening pain in the right leg as well as swelling. Patient does have some redness involving the right leg as well. Ultrasound lower extremity shows evidence of occlusive DVT involving all the way up  to right iliac vein. With concern for post rheumatic syndrome, vascular surgery  was consulted. Currently recommends conservative management as benefits of intervention appears less than the risk of thrombolysis. Compressive stockings. Given the rapidity of worsening per patient as well as tightness I would use heparin at present and monitor the patient for stability of swelling and pain, before transitioning to oral anticoagulation. Lifelong anticoagulation recommended given second episode of DVT. Patient agreeable to transition to Xarelto on discharge as she did not have any major side effects with this medication.  2. Sarcoidosis. Patient was recently started on Humira and she actually started noticing this DVT symptoms right after giving herself injection. I do not see any correlation would recommend the patient to discuss with her rheumatologist outpatient. Currently continuing prednisone.  3. Essential hypertension. Continuing Coreg, holding Lasix.  4. History of depression. Next and continue home regimen at present.  5. GERD. Continue PPI.  6. Type 2 diabetes mellitus. Holding metformin at present. Placing the patient sliding scale insulin.  7. Concern for right leg cellulitis. Patient was started on IV Ancef in the ER, currently I would continue it.  Nutrition: Cardiac and carb modified diet  Advance goals of care discussion: full code   Consults: vascular surgery   Family Communication: no family was present at bedside, at the time of interview.  Disposition: Admitted as inpatient, telemetry unit. Likely to be discharged home, in 2-3 days.  Author: Berle Mull, MD [facility] Pager: [phone] 07/21/2016  If 7PM-7AM, please contact night-coverage [email or web address] Password [email or web address]

## 2016-07-21 NOTE — Progress Notes (Addendum)
ANTICOAGULATION CONSULT NOTE - Initial Consult  Pharmacy Consult for IV Heparin Indication: DVT/PE  Allergies  Allergen Reactions  . Enalapril Maleate Cough  . Hydrochlorothiazide Other (See Comments)    Reaction:  Low potassium levels   . Hydroxychloroquine Sulfate Other (See Comments)    Reaction:  Vision changes   . Latex Hives  . Nickel Other (See Comments)    Reaction:  Blisters     Patient Measurements: Height: 5\' 7"  (170.2 cm) Weight: (!) 326 lb 8 oz (148.1 kg) IBW/kg (Calculated) : 61.6  Heparin dosing weight = 98 kg  Vital Signs: Temp: 98.2 F (36.8 C) (07/08 1500) Temp Source: Oral (07/08 1500) BP: 102/56 (07/08 1500) Pulse Rate: 88 (07/08 1500)  Labs:  Recent Labs  07/21/16 0212  HGB 12.4  HCT 38.4  PLT 178  LABPROT 13.4  INR 1.02  CREATININE 1.22*    Estimated Creatinine Clearance: 79.1 mL/min (A) (by C-G formula based on SCr of 1.22 mg/dL (H)).   Medical History: Past Medical History:  Diagnosis Date  . Allergic rhinitis   . ALLERGIC RHINITIS 10/26/2009  . Anxiety   . ANXIETY 08/14/2006  . B12 DEFICIENCY 08/25/2007  . Confusion   . Depression    takes Lexapro daily  . Diabetes mellitus without complication ([place])    was on insulin but has been off since Nov 2015 and now only takes Metformin daily  . DYSPNEA 04/28/2009   with exertion  . Esophageal reflux    takes Nexium daily  . Fibromyalgia   . Headache    last migraine 2-75yrs ago;takes Topamax daily  . History of shingles   . Hypertension    takes Coreg daily  . Insomnia    takes Nortriptyline nightly   . Joint pain   . Joint swelling   . Left knee pain   . Lichen planus   . Long-term memory loss   . Nocturia   . OSA (obstructive sleep apnea)    doesn't use CPAP;sleep study in epic from 2006  . Osteoarthritis   . Osteoarthritis   . Pneumonia    over 30 yrs ago  . Protein calorie malnutrition ([place])   . Rheumatoid arthritis ([place])   . Sarcoidosis    Dr. Lolita Patella  .  Short-term memory loss   . Shortness of breath   . TIA (transient ischemic attack)   . Unsteady gait   . Urinary urgency   . Vitamin D deficiency    is supposed to take Vit D but can't afford it  . VITAMIN D DEFICIENCY 08/25/2007    Assessment:  56 yr female presented with right leg pain and swelling.  PMH significant for PE and completed a course of Xarelto this past spring (April 2018).    Elevated D-dimer  CTAngio + PE       Dopplers + DVT right lower extremity  Patient received Enoxaparin 145mg  sq x 1 dose in ED @ 07:25 today  Pharmacy initially consulted to dose Xarelto for DVT/PE, then therapy plan changed and pharmacy consulted to dose IV heparin (Xarelto consult d/c'ed).  Goal of Therapy:  Heparin level 0.3-0.7 Monitor platelets by anticoagulation protocol: Yes   Plan:   No heparin bolus due to Lovenox dose given this AM  Begin IV heparin @ 1700 units/hr (17 units/kg/hr)  Check heparin level 6 hr after heparin started  Follow heparin level and CBC daily  Tylena Prisk, Toribio Harbour, PharmD 07/21/2016,5:45 PM

## 2016-07-21 NOTE — Progress Notes (Signed)
*  PRELIMINARY RESULTS* Vascular Ultrasound Bilateral lower extremity venous duplex has been completed.  Preliminary findings: Findings suggest extensive occlusive deep vein thrombosis throughout the right lower extremity.  Thrombosis is seen from the right iliac vein throughout the mid femoral vein.  Limited visualization of distal femoral, popliteal and calf veins due to patient body habitus.  No evidence of deep vein thrombosis in the left lower extremity.  Negative for baker's cysts bilaterally.  Preliminary results given to patients nurse, and Dr. Posey Pronto at bedside @ 9:20am.  Everrett Coombe 07/21/2016, 9:21 AM

## 2016-07-21 NOTE — Consult Note (Signed)
Referring Physician: [facility]  Patient name: [name] MRN: [id number] DOB: 12/02/1960 Sex: female  REASON FOR CONSULT: right leg DVT   HPI: [name] is a 56 y.o. female with 1 day history of swelling in right leg after humira injection.  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  Pt denies prior history of DVT but did have PE 8/17 and was on Xarelto up until this spring when is was stopped.  She complains primarily of right leg pain and fullness. She is overall fairly sedentary and dependent due to her obesity and multiple comorbidities including sarcoid. She denies family history of hypercoag state. Other medical problems include   Past Medical History:  Diagnosis Date  . Allergic rhinitis   . ALLERGIC RHINITIS 10/26/2009  . Anxiety   . ANXIETY 08/14/2006  . B12 DEFICIENCY 08/25/2007  . Confusion   . Depression    takes Lexapro daily  . Diabetes mellitus without complication ([place])    was on insulin but has been off since Nov 2015 and now only takes Metformin daily  . DYSPNEA 04/28/2009   with exertion  . Esophageal reflux    takes Nexium daily  . Fibromyalgia   . Headache    last migraine 2-51yrs ago;takes Topamax daily  . History of shingles   . Hypertension    takes Coreg daily  . Insomnia    takes Nortriptyline nightly   . Joint pain   . Joint swelling   . Left knee pain   . Lichen planus   . Long-term memory loss   . Nocturia   . OSA (obstructive sleep apnea)    doesn't use CPAP;sleep study in epic from 2006  . Osteoarthritis   . Osteoarthritis   . Pneumonia    over 30 yrs ago  . Protein calorie malnutrition ([place])   . Rheumatoid arthritis ([place])   . Sarcoidosis    Dr. Lolita Patella  . Short-term memory loss   . Shortness of breath   . TIA (transient ischemic attack)   . Unsteady gait   . Urinary urgency   . Vitamin D deficiency    is supposed to take Vit D but can't afford it  . VITAMIN D DEFICIENCY 08/25/2007   Past  Surgical History:  Procedure Laterality Date  . APPENDECTOMY    . arthroscopic knee surgery Right 11-12-04  . AXILLARY ABCESS IRRIGATION AND DEBRIDEMENT  Jul & Aug2012  . CARPAL TUNNEL RELEASE Left 05/23/2014   Procedure: CARPAL TUNNEL RELEASE;  Surgeon: Meredith Pel, MD;  Location: [place];  Service: Orthopedics;  Laterality: Left;  . cyst removed from top of buttocks  at age 25  . ENDOMETRIAL ABLATION    . TOTAL KNEE ARTHROPLASTY Right 11/15/2014   Procedure: TOTAL RIGHT KNEE ARTHROPLASTY;  Surgeon: Meredith Pel, MD;  Location: [place];  Service: Orthopedics;  Laterality: Right;  . TOTAL KNEE ARTHROPLASTY Left 07/13/2015   Procedure: LEFT TOTAL KNEE ARTHROPLASTY;  Surgeon: Meredith Pel, MD;  Location: [place];  Service: Orthopedics;  Laterality: Left;    Family History  Problem Relation Age of Onset  . Heart disease Mother   . Kidney disease Mother   . Cancer Father        leukemia  . Hypertension Unknown   . Coronary artery disease Unknown        female 1st degree relative <60  . Heart failure Unknown        congestive  .  Coronary artery disease Unknown        Female 1st degree relative <50  . Breast cancer Unknown        1st degree relative <50 S    SOCIAL HISTORY: Social History   Social History  . Marital status: Single    Spouse name: N/A  . Number of children: N/A  . Years of education: N/A   Occupational History  . disabled    Social History Main Topics  . Smoking status: Former Smoker    Packs/day: 0.50    Years: 10.00    Types: Cigarettes  . Smokeless tobacco: Never Used     Comment: quit smoking in 2004  . Alcohol use No  . Drug use: No  . Sexual activity: Not Currently   Other Topics Concern  . Not on file   Social History Narrative   Single, broke up with partner in 2008.    Allergies  Allergen Reactions  . Enalapril Maleate Cough  . Hydrochlorothiazide Other (See Comments)    Reaction:  Low potassium levels   . Hydroxychloroquine  Sulfate Other (See Comments)    Reaction:  Vision changes   . Latex Hives  . Nickel Other (See Comments)    Reaction:  Blisters     Current Facility-Administered Medications  Medication Dose Route Frequency Provider Last Rate Last Dose  . acetaminophen (TYLENOL) tablet 650 mg  650 mg Oral Q6H PRN Lavina Hamman, MD       Or  . acetaminophen (TYLENOL) suppository 650 mg  650 mg Rectal Q6H PRN Lavina Hamman, MD      . albuterol (PROVENTIL) (2.5 MG/3ML) 0.083% nebulizer solution 2.5 mg  2.5 mg Nebulization Q2H PRN Lavina Hamman, MD      . albuterol (PROVENTIL) (2.5 MG/3ML) 0.083% nebulizer solution 3 mL  3 mL Inhalation Q6H PRN Lavina Hamman, MD      . carvedilol (COREG) tablet 6.25 mg  6.25 mg Oral BID WC Lavina Hamman, MD      . ceFAZolin (ANCEF) IVPB 1 g/50 mL premix  1 g Intravenous Q8H Lavina Hamman, MD      . gabapentin (NEURONTIN) capsule 300 mg  300 mg Oral BID Lavina Hamman, MD   300 mg at 07/21/16 1112  . insulin aspart (novoLOG) injection 0-5 Units  0-5 Units Subcutaneous QHS Berle Mull M, MD      . insulin aspart (novoLOG) injection 0-9 Units  0-9 Units Subcutaneous TID WC Lavina Hamman, MD      . iopamidol (ISOVUE-370) 76 % injection           . methocarbamol (ROBAXIN) tablet 500 mg  500 mg Oral Q8H PRN Lavina Hamman, MD      . nortriptyline (PAMELOR) capsule 75 mg  75 mg Oral QHS Lavina Hamman, MD      . ondansetron [facility]) tablet 4 mg  4 mg Oral Q6H PRN Lavina Hamman, MD       Or  . ondansetron [facility]) injection 4 mg  4 mg Intravenous Q6H PRN Lavina Hamman, MD      . pantoprazole (PROTONIX) EC tablet 40 mg  40 mg Oral Daily Lavina Hamman, MD   40 mg at 07/21/16 1112  . [START ON 07/22/2016] predniSONE (DELTASONE) tablet 5 mg  5 mg Oral Q breakfast Berle Mull M, MD      . sodium chloride flush (NS) 0.9 % injection 3 mL  3  mL Intravenous Q12H Lavina Hamman, MD      . Suvorexant TABS 1 tablet  1 tablet Oral QHS Lavina Hamman, MD      .  topiramate (TOPAMAX) tablet 50 mg  50 mg Oral BID Lavina Hamman, MD   50 mg at 07/21/16 1112  . traMADol (ULTRAM) tablet 50 mg  50 mg Oral Q6H PRN Lavina Hamman, MD      . Derrill Memo ON 07/22/2016] venlafaxine XR (EFFEXOR-XR) 24 hr capsule 75 mg  75 mg Oral Q breakfast Lavina Hamman, MD        ROS:   General:  No weight loss, Fever, chills  HEENT: No recent headaches, no nasal bleeding, no visual changes, no sore throat  Neurologic: No dizziness, blackouts, seizures. No recent symptoms of stroke or mini- stroke. No recent episodes of slurred speech, or temporary blindness.  Cardiac: No recent episodes of chest pain/pressure, no shortness of breath at rest.  + shortness of breath with exertion.  Denies history of atrial fibrillation or irregular heartbeat  Vascular: No history of rest pain in feet.  No history of claudication.  No history of non-healing ulcer, No history of DVT   Pulmonary: No home oxygen, no productive cough, no hemoptysis,  No asthma or wheezing  Musculoskeletal:  [X]  Arthritis, [X]  Low back pain,  [X]  Joint pain  Hematologic:No history of hypercoagulable state.  No history of easy bleeding.  No history of anemia  Gastrointestinal: No hematochezia or melena,  No gastroesophageal reflux, no trouble swallowing  Urinary: [ ]  chronic Kidney disease, [ ]  on HD - [ ]  MWF or [ ]  TTHS, [ ]  Burning with urination, [ ]  Frequent urination, [ ]  Difficulty urinating;   Skin: No rashes  Psychological: No history of anxiety,  No history of depression   Physical Examination  Vitals:   07/21/16 0601 07/21/16 0635 07/21/16 0730 07/21/16 0828  BP: 118/72 108/73 115/77 101/64  Pulse: 95 94  92  Resp: 18 18 15    Temp:      TempSrc:    Oral  SpO2: 93% 93%  95%  Weight:    (!) 326 lb 8 oz (148.1 kg)  Height:    5\' 7"  (1.702 m)    Body mass index is 51.14 kg/m.  General:  Alert and oriented, no acute distress HEENT: Normal Neck: No bruit or JVD Pulmonary: Clear to  auscultation bilaterally Cardiac: Regular Rate and Rhythm  Abdomen: Soft, non-tender, non-distended, no mass, obese Skin: No rash, few scattered spider type veins anterior thighs Extremities: diffuse edema right leg about 30% larger than left, foot pink and warm without evidence of arterial compromise Musculoskeletal: No deformity well healed knee replacement scars Neurologic: Upper and lower extremity motor 5/5 and symmetric  DATA:  Right iliofemoral DVT on [place]   ASSESSMENT:  Pt with right iliofemoral DVT no evidence of limb threatening ischemia currently.  Pt risk vs benefit of thrombolysis would be significant risk vs minimal benefit as she is not really active or ambulatory.  She would carry the risk of bleeding/renal failure from thrombolysis with minimal benefit   PLAN:  Would prescribe standard anticoagulation and consider this lifelong at this point since she had a PE and now has had DVT after coming off xarelto.  I believe she is very high risk for recurrent DVT and PE.  Follow up with [place] on as needed basis  Ruta Hinds, MD [facility] Office: [phone] Pager:  [phone]   

## 2016-07-21 NOTE — ED Provider Notes (Signed)
[facility] DEPT Provider Note   CSN: [id number] Arrival date & time: 07/20/16  2310     History   Chief Complaint Chief Complaint  Patient presents with  . Hip Pain    HPI [name] is a 56 y.o. female.  HPI   56 yo F with pMHx as below here with right leg pain and swelling. Pt states that she injected Humira into her right leg earlier this week, as instructed. Over the past 2-3 days, she has developed progressively worsening RLE redness, warmth, pain, and swelling. She is now having difficulty ambulating 2/2 her pain. She feels hot with chills btu no known or documented fevers. She also reports chronic cough but states this may be 2/2 her sarcoid. No hemoptysis. She does have h/o PE but states she did not have any DVTs. She is no longer on blood thinners.  Past Medical History:  Diagnosis Date  . Allergic rhinitis   . ALLERGIC RHINITIS 10/26/2009  . Anxiety   . ANXIETY 08/14/2006  . B12 DEFICIENCY 08/25/2007  . Confusion   . Depression    takes Lexapro daily  . Diabetes mellitus without complication ([place])    was on insulin but has been off since Nov 2015 and now only takes Metformin daily  . DYSPNEA 04/28/2009   with exertion  . Esophageal reflux    takes Nexium daily  . Fibromyalgia   . Headache    last migraine 2-35yrs ago;takes Topamax daily  . History of shingles   . Hypertension    takes Coreg daily  . Insomnia    takes Nortriptyline nightly   . Joint pain   . Joint swelling   . Left knee pain   . Lichen planus   . Long-term memory loss   . Nocturia   . OSA (obstructive sleep apnea)    doesn't use CPAP;sleep study in epic from 2006  . Osteoarthritis   . Osteoarthritis   . Pneumonia    over 30 yrs ago  . Protein calorie malnutrition ([place])   . Rheumatoid arthritis ([place])   . Sarcoidosis    Dr. Lolita Patella  . Short-term memory loss   . Shortness of breath   . TIA (transient ischemic attack)   . Unsteady gait   . Urinary urgency   . Vitamin  D deficiency    is supposed to take Vit D but can't afford it  . VITAMIN D DEFICIENCY 08/25/2007    Patient Active Problem List   Diagnosis Date Noted  . Pulmonary embolism ([place]) 07/21/2016  . Cough 07/11/2016  . Balance problems 06/14/2016  . Pain of right scapula 06/07/2016  . Acute bronchitis 01/30/2016  . Weight gain 12/20/2015  . Acute pulmonary embolism ([place]) 08/17/2015  . Diabetes mellitus type 2 with complications ([place]) 90/24/0973  . Postoperative anemia due to acute blood loss 02/01/2015  . Arthritis of knee, degenerative 11/15/2014  . Calf pain 01/21/2014  . Insomnia 12/23/2013  . Leg ulcer, left ([place]) 09/13/2013  . Abnormal SPEP 06/04/2013  . Memory loss 04/27/2013  . Panic attacks 03/17/2013  . Right knee pain 12/29/2012  . Rash and nonspecific skin eruption 10/27/2012  . Dizziness and giddiness 09/07/2012  . Pulmonary nodules 07/20/2012  . CAD in native artery 05/28/2012  . Seborrheic dermatitis 01/20/2012  . Recurrent knee pain 09/03/2011  . Knee pain, left 08/03/2011  . Leg pain, left 07/17/2011  . Polydipsia 05/09/2011  . Abscess of axilla, left 08/10/2010  . Obesity 03/07/2010  .  INSOMNIA, CHRONIC 03/07/2010  . NAUSEA 03/07/2010  . Avulsion fracture of tooth 03/07/2010  . ALLERGIC RHINITIS 10/26/2009  . GRIEF REACTION 08/01/2009  . DYSPNEA 04/28/2009  . MAXILLARY SINUSITIS 10/05/2008  . Essential hypertension 08/27/2007  . B12 deficiency 08/25/2007  . Vitamin D deficiency 08/25/2007  . ELEVATED BP 07/10/2007  . VISUAL CHANGES 07/02/2007  . UTI 02/07/2007  . ABDOMINAL PAIN RIGHT LOWER QUADRANT 02/07/2007  . SINUSITIS, ACUTE 02/02/2007  . Osteoarthritis 02/02/2007  . Sarcoidosis 08/14/2006  . Anxiety state 08/14/2006  . Major depressive disorder, recurrent episode ([place]) 08/14/2006  . GERD 08/14/2006    Past Surgical History:  Procedure Laterality Date  . APPENDECTOMY    . arthroscopic knee surgery Right 11-12-04  . AXILLARY ABCESS IRRIGATION  AND DEBRIDEMENT  Jul & Aug2012  . CARPAL TUNNEL RELEASE Left 05/23/2014   Procedure: CARPAL TUNNEL RELEASE;  Surgeon: Meredith Pel, MD;  Location: [facility];  Service: Orthopedics;  Laterality: Left;  . cyst removed from top of buttocks  at age 69  . ENDOMETRIAL ABLATION    . TOTAL KNEE ARTHROPLASTY Right 11/15/2014   Procedure: TOTAL RIGHT KNEE ARTHROPLASTY;  Surgeon: Meredith Pel, MD;  Location: [place];  Service: Orthopedics;  Laterality: Right;  . TOTAL KNEE ARTHROPLASTY Left 07/13/2015   Procedure: LEFT TOTAL KNEE ARTHROPLASTY;  Surgeon: Meredith Pel, MD;  Location: [place];  Service: Orthopedics;  Laterality: Left;    OB History    No data available       Home Medications    Prior to Admission medications   Medication Sig Start Date End Date Taking? Authorizing Provider  Adalimumab (HUMIRA) 40 MG/0.8ML PSKT Inject 40 mg into the skin every 14 (fourteen) days.   Yes [provider]  albuterol (PROVENTIL HFA;VENTOLIN HFA) 108 (90 Base) MCG/ACT inhaler Inhale 2 puffs into the lungs every 6 (six) hours as needed for wheezing or shortness of breath. 01/30/16  Yes Parrett, Tammy S, NP  BELSOMRA 20 MG TABS TAKE 1 TABLET BY MOUTH EVERY NIGHT AT BEDTIME 06/27/16  Yes Plotnikov, Evie Lacks, MD  carvedilol (COREG) 12.5 MG tablet TAKE 1 TABLET BY MOUTH TWICE DAILY WITH A MEAL 02/09/16  Yes Plotnikov, Evie Lacks, MD  chlorhexidine (PERIDEX) 0.12 % solution Use as directed 5 mLs in the mouth or throat 2 (two) times daily. Pt is to swish and spit.   Yes [provider]  diclofenac sodium (VOLTAREN) 1 % GEL Apply 2 g topically 2 (two) times daily. Patient taking differently: Apply 2 g topically 2 (two) times daily as needed (for pain).  07/08/16  Yes Meredith Pel, MD  esomeprazole (NEXIUM) 40 MG capsule Take 40 mg by mouth daily before breakfast.    Yes Plotnikov, Evie Lacks, MD  fluocinonide gel (LIDEX) 9.41 % Apply 1 application topically 2 (two) times daily as needed  (for sores on gums).    Yes [place], [place], DDS  furosemide (LASIX) 20 MG tablet Take 1 tablet (20 mg total) by mouth daily. Patient taking differently: Take 20 mg by mouth at bedtime.  06/07/16  Yes Plotnikov, Evie Lacks, MD  gabapentin (NEURONTIN) 300 MG capsule Take 1 capsule (300 mg total) by mouth 2 (two) times daily. Patient taking differently: Take 300 mg by mouth 2 (two) times daily as needed (for pain).  04/09/16  Yes Plotnikov, Evie Lacks, MD  KLOR-CON 10 10 MEQ tablet TAKE 1 TABLET BY MOUTH DAILY 02/12/16  Yes Plotnikov, Evie Lacks, MD  lidocaine (XYLOCAINE) 2 %  solution Use as directed 20 mLs in the mouth or throat 3 (three) times daily as needed for mouth pain. Pt is to swish and spit.   Yes [place], [place], DDS  metFORMIN (GLUCOPHAGE-XR) 500 MG 24 hr tablet TAKE 2 TABLETS(1000 MG) BY MOUTH DAILY WITH BREAKFAST Patient taking differently: Take 1,000 mg by mouth daily with breakfast.  07/18/16  Yes Renato Shin, MD  nortriptyline (PAMELOR) 75 MG capsule Take 1 capsule (75 mg total) by mouth at bedtime. 01/24/16  Yes Plotnikov, Evie Lacks, MD  predniSONE (DELTASONE) 5 MG tablet Take 5 mg by mouth daily with breakfast.   Yes [provider]  topiramate (TOPAMAX) 50 MG tablet TAKE 1 TABLET BY MOUTH TWICE DAILY 02/09/16  Yes Plotnikov, Evie Lacks, MD  venlafaxine XR (EFFEXOR XR) 75 MG 24 hr capsule Take 1 capsule (75 mg total) by mouth daily with breakfast. 06/07/16  Yes Plotnikov, Evie Lacks, MD    Family History Family History  Problem Relation Age of Onset  . Heart disease Mother   . Kidney disease Mother   . Cancer Father        leukemia  . Hypertension Unknown   . Coronary artery disease Unknown        female 1st degree relative <60  . Heart failure Unknown        congestive  . Coronary artery disease Unknown        Female 1st degree relative <50  . Breast cancer Unknown        1st degree relative <50 S    Social History Social History  Substance Use Topics  .  Smoking status: Former Smoker    Packs/day: 0.50    Years: 10.00    Types: Cigarettes  . Smokeless tobacco: Never Used     Comment: quit smoking in 2004  . Alcohol use No     Allergies   Enalapril maleate; Hydrochlorothiazide; Hydroxychloroquine sulfate; Latex; and Nickel   Review of Systems Review of Systems  Constitutional: Positive for fatigue.  Cardiovascular: Positive for leg swelling.  Musculoskeletal: Positive for arthralgias.  Skin: Positive for rash.  All other systems reviewed and are negative.    Physical Exam Updated Vital Signs BP 101/64 (BP Location: Left Arm)   Pulse 92   Temp 98.4 F (36.9 C) (Oral)   Resp 15   Ht 5\' 7"  (1.702 m)   Wt (!) 148.1 kg (326 lb 8 oz)   SpO2 95%   BMI 51.14 kg/m   Physical Exam  Constitutional: She is oriented to person, place, and time. She appears well-developed and well-nourished. No distress.  HENT:  Head: Normocephalic and atraumatic.  Eyes: Conjunctivae are normal.  Neck: Neck supple.  Cardiovascular: Normal rate, regular rhythm and normal heart sounds.  Exam reveals no friction rub.   No murmur heard. Pulmonary/Chest: Effort normal. No respiratory distress. She has no wheezes. She has rales (bibasilar).  Abdominal: She exhibits no distension.  Musculoskeletal: She exhibits edema.  Marked asymmetric edema of RLE, with diffuse erythema and TTP over medial aspect of leg and anterior thigh. TTP over popliteal area b/l.  Neurological: She is alert and oriented to person, place, and time. She exhibits normal muscle tone.  Skin: Skin is warm. Capillary refill takes less than 2 seconds.  Psychiatric: She has a normal mood and affect.  Nursing note and vitals reviewed.    ED Treatments / Results  Labs (all labs ordered are listed, but only abnormal results are displayed) Labs Reviewed  CBC WITH DIFFERENTIAL/PLATELET - Abnormal; Notable for the following:       Result Value   WBC 13.7 (*)    Neutro Abs 9.5 (*)     Monocytes Absolute 1.1 (*)    All other components within normal limits  COMPREHENSIVE METABOLIC PANEL - Abnormal; Notable for the following:    Glucose, Bld 154 (*)    BUN 23 (*)    Creatinine, Ser 1.22 (*)    Calcium 8.7 (*)    Albumin 2.9 (*)    GFR calc non Af Amer 49 (*)    GFR calc Af Amer 57 (*)    All other components within normal limits  D-DIMER, QUANTITATIVE (NOT AT [facility]) - Abnormal; Notable for the following:    D-Dimer, Quant 9.50 (*)    All other components within normal limits  CULTURE, BLOOD (ROUTINE X 2)  CULTURE, BLOOD (ROUTINE X 2)  PROTIME-INR  I-STAT CG4 LACTIC ACID, ED    EKG  EKG Interpretation None       Radiology Ct Angio Chest Pe W And/or Wo Contrast  Result Date: 07/21/2016 CLINICAL DATA:  Acute onset of right hip pain, extending to the foot, with soft tissue swelling. Elevated D-dimer. Current history of sarcoidosis. Initial encounter. EXAM: CT ANGIOGRAPHY CHEST WITH CONTRAST TECHNIQUE: Multidetector CT imaging of the chest was performed using the standard protocol during bolus administration of intravenous contrast. Multiplanar CT image reconstructions and MIPs were obtained to evaluate the vascular anatomy. CONTRAST:  100 mL of Isovue 370 IV contrast COMPARISON:  Chest radiograph performed 07/11/2016, and CT of the chest performed 02/02/2013 FINDINGS: Cardiovascular: There is a small chronic appearing segmental pulmonary embolus within a pulmonary artery branch to the right lower lobe. There is apparent mild stricturing of pulmonary arterial and venous structures at the hila bilaterally, likely corresponding to the patient's sarcoidosis. Emptying of the pulmonary veins is difficult to fully assess at the hila. Diffuse coronary artery calcifications are seen. The heart remains normal in size. Mild calcification is noted along the aortic arch. The great vessels are unremarkable in appearance. Mediastinum/Nodes: Numerous calcified mediastinal and hilar nodes  are noted, measuring up to 1.7 cm in short axis. No pericardial effusion is identified. The visualized portions of the thyroid gland are unremarkable. No axillary lymphadenopathy is seen. Lungs/Pleura: Scattered bilateral pulmonary nodules and mild posterior right upper lobe scarring is noted, likely corresponding to the patient's sarcoidosis. No pleural effusion or pneumothorax is seen. Evaluation for mass is limited given the patient's underlying sarcoidosis. Upper Abdomen: The visualized portions of the liver and spleen are grossly unremarkable. The visualized portions of the gallbladder, pancreas, adrenal glands and kidneys are within normal limits. Musculoskeletal: No acute osseous abnormalities are identified. The visualized musculature is unremarkable in appearance. Review of the MIP images confirms the above findings. IMPRESSION: 1. Small chronic appearing segmental pulmonary embolus within a pulmonary artery branch to the right lower lung lobe. 2. Apparent mild stricturing of pulmonary arterial and venous structures at the hila bilaterally, likely corresponding to the patient's sarcoidosis. Diffuse surrounding chronic calcified mediastinal and hilar lymphadenopathy. 3. Scattered bilateral pulmonary nodules and mild posterior right upper lobe scarring, likely reflecting the patient's sarcoidosis. 4. Diffuse coronary artery calcifications noted. These results were called by telephone at the time of interpretation on 07/21/2016 at 5:37 am to Dr. Duffy Bruce, who verbally acknowledged these results. Electronically Signed   By: Garald Balding M.D.   On: 07/21/2016 05:39    Procedures Procedures (including critical care  time)  Medications Ordered in ED Medications  iopamidol (ISOVUE-370) 76 % injection (not administered)  morphine 2 MG/ML injection 4 mg (4 mg Intravenous Given 07/21/16 0223)  ondansetron (ZOFRAN) injection 4 mg (4 mg Intravenous Given 07/21/16 0223)  sodium chloride 0.9 % bolus 1,000 mL  (0 mLs Intravenous Stopped 07/21/16 0514)  ceFAZolin (ANCEF) IVPB 2g/100 mL premix (0 g Intravenous Stopped 07/21/16 0514)  iopamidol (ISOVUE-370) 76 % injection 100 mL (100 mLs Intravenous Contrast Given 07/21/16 0458)  enoxaparin (LOVENOX) injection 145 mg (145 mg Subcutaneous Given 07/21/16 0725)     Initial Impression / Assessment and Plan / ED Course  I have reviewed the triage vital signs and the nursing notes.  Pertinent labs & imaging results that were available during my care of the patient were reviewed by me and considered in my medical decision making (see chart for details).     56 yo F with PMHx as above here with RLE redness and pain after Humira injection. Labs show significant leukocytosis, normal LA. Concern for RLE cellulitis, but must also consider possible DVT given h/o PE. D-Dimer markedly elevated so will obtain LE venous. Given empiric Ancef (nonpurulent cellulitis) and lovenox. Also, of note, tp reports chronic, non productive cough. While this may be 2/2 her sarcoid, will check CT Angio for further assessment.  CT Angio positive for subacute/chronic PE. Lovenox already given. Will admit for possible cellulitis versus DVT in immune suppressed pt.  Final Clinical Impressions(s) / ED Diagnoses   Final diagnoses:  Other chronic pulmonary embolism without acute cor pulmonale (HCC)  Leg swelling  Cellulitis of leg, right    New Prescriptions Current Discharge Medication List       Duffy Bruce, MD 07/21/16 1003

## 2016-07-22 ENCOUNTER — Other Ambulatory Visit: Payer: Self-pay | Admitting: *Deleted

## 2016-07-22 DIAGNOSIS — I2699 Other pulmonary embolism without acute cor pulmonale: Principal | ICD-10-CM

## 2016-07-22 LAB — CBC
HCT: 36 % (ref 36.0–46.0)
HEMOGLOBIN: 11.5 g/dL — AB (ref 12.0–15.0)
MCH: 30 pg (ref 26.0–34.0)
MCHC: 31.9 g/dL (ref 30.0–36.0)
MCV: 94 fL (ref 78.0–100.0)
Platelets: 191 10*3/uL (ref 150–400)
RBC: 3.83 MIL/uL — AB (ref 3.87–5.11)
RDW: 14.5 % (ref 11.5–15.5)
WBC: 10.5 10*3/uL (ref 4.0–10.5)

## 2016-07-22 LAB — COMPREHENSIVE METABOLIC PANEL
ALT: 19 U/L (ref 14–54)
ANION GAP: 8 (ref 5–15)
AST: 24 U/L (ref 15–41)
Albumin: 2.6 g/dL — ABNORMAL LOW (ref 3.5–5.0)
Alkaline Phosphatase: 59 U/L (ref 38–126)
BUN: 19 mg/dL (ref 6–20)
CALCIUM: 8.5 mg/dL — AB (ref 8.9–10.3)
CHLORIDE: 104 mmol/L (ref 101–111)
CO2: 25 mmol/L (ref 22–32)
CREATININE: 1.01 mg/dL — AB (ref 0.44–1.00)
Glucose, Bld: 148 mg/dL — ABNORMAL HIGH (ref 65–99)
Potassium: 3.8 mmol/L (ref 3.5–5.1)
SODIUM: 137 mmol/L (ref 135–145)
Total Bilirubin: 0.3 mg/dL (ref 0.3–1.2)
Total Protein: 6.4 g/dL — ABNORMAL LOW (ref 6.5–8.1)

## 2016-07-22 LAB — BLOOD CULTURE ID PANEL (REFLEXED)
ACINETOBACTER BAUMANNII: NOT DETECTED
CANDIDA ALBICANS: NOT DETECTED
Candida glabrata: NOT DETECTED
Candida krusei: NOT DETECTED
Candida parapsilosis: NOT DETECTED
Candida tropicalis: NOT DETECTED
ENTEROBACTERIACEAE SPECIES: NOT DETECTED
ENTEROCOCCUS SPECIES: NOT DETECTED
Enterobacter cloacae complex: NOT DETECTED
Escherichia coli: NOT DETECTED
HAEMOPHILUS INFLUENZAE: NOT DETECTED
Klebsiella oxytoca: NOT DETECTED
Klebsiella pneumoniae: NOT DETECTED
LISTERIA MONOCYTOGENES: NOT DETECTED
METHICILLIN RESISTANCE: DETECTED — AB
NEISSERIA MENINGITIDIS: NOT DETECTED
PSEUDOMONAS AERUGINOSA: NOT DETECTED
Proteus species: NOT DETECTED
SERRATIA MARCESCENS: NOT DETECTED
STAPHYLOCOCCUS AUREUS BCID: NOT DETECTED
STREPTOCOCCUS PNEUMONIAE: NOT DETECTED
STREPTOCOCCUS PYOGENES: NOT DETECTED
STREPTOCOCCUS SPECIES: NOT DETECTED
Staphylococcus species: DETECTED — AB
Streptococcus agalactiae: NOT DETECTED

## 2016-07-22 LAB — MAGNESIUM: MAGNESIUM: 2.2 mg/dL (ref 1.7–2.4)

## 2016-07-22 LAB — GLUCOSE, CAPILLARY
GLUCOSE-CAPILLARY: 153 mg/dL — AB (ref 65–99)
Glucose-Capillary: 148 mg/dL — ABNORMAL HIGH (ref 65–99)
Glucose-Capillary: 229 mg/dL — ABNORMAL HIGH (ref 65–99)
Glucose-Capillary: 130 mg/dL — ABNORMAL HIGH (ref 65–99)

## 2016-07-22 LAB — HEPARIN LEVEL (UNFRACTIONATED)
HEPARIN UNFRACTIONATED: 1.44 [IU]/mL — AB (ref 0.30–0.70)
HEPARIN UNFRACTIONATED: 2.12 [IU]/mL — AB (ref 0.30–0.70)
Heparin Unfractionated: 0.85 IU/mL — ABNORMAL HIGH (ref 0.30–0.70)

## 2016-07-22 LAB — HIV ANTIBODY (ROUTINE TESTING W REFLEX): HIV Screen 4th Generation wRfx: NONREACTIVE

## 2016-07-22 LAB — PROTIME-INR
INR: 1.07
Prothrombin Time: 13.9 seconds (ref 11.4–15.2)

## 2016-07-22 MED ORDER — HEPARIN (PORCINE) IN NACL 100-0.45 UNIT/ML-% IJ SOLN
1500.0000 [IU]/h | INTRAMUSCULAR | Status: DC
  Administered 2016-07-22: 1500 [IU]/h via INTRAVENOUS
  Filled 2016-07-22: qty 250

## 2016-07-22 MED ORDER — FLUOCINONIDE 0.05 % EX GEL
1.0000 "application " | Freq: Two times a day (BID) | CUTANEOUS | Status: DC | PRN
  Filled 2016-07-22: qty 15

## 2016-07-22 MED ORDER — HEPARIN (PORCINE) IN NACL 100-0.45 UNIT/ML-% IJ SOLN
1200.0000 [IU]/h | INTRAMUSCULAR | Status: DC
  Administered 2016-07-22: 1200 [IU]/h via INTRAVENOUS

## 2016-07-22 MED ORDER — DICLOFENAC SODIUM 1 % TD GEL
2.0000 g | Freq: Two times a day (BID) | TRANSDERMAL | Status: DC
  Administered 2016-07-22 – 2016-07-23 (×2): 2 g via TOPICAL
  Filled 2016-07-22: qty 100

## 2016-07-22 MED ORDER — FLUOCINONIDE 0.05 % EX OINT
TOPICAL_OINTMENT | Freq: Two times a day (BID) | CUTANEOUS | Status: DC | PRN
  Administered 2016-07-22: 23:00:00 via TOPICAL
  Filled 2016-07-22 (×2): qty 15

## 2016-07-22 MED ORDER — TRAMADOL HCL 50 MG PO TABS
100.0000 mg | ORAL_TABLET | Freq: Four times a day (QID) | ORAL | Status: AC
  Administered 2016-07-22 – 2016-07-23 (×4): 100 mg via ORAL
  Filled 2016-07-22 (×3): qty 2

## 2016-07-22 MED ORDER — TRAMADOL HCL 50 MG PO TABS
100.0000 mg | ORAL_TABLET | Freq: Four times a day (QID) | ORAL | Status: DC | PRN
  Filled 2016-07-22: qty 2

## 2016-07-22 MED ORDER — CEPHALEXIN 500 MG PO CAPS
500.0000 mg | ORAL_CAPSULE | Freq: Four times a day (QID) | ORAL | Status: DC
  Administered 2016-07-22 – 2016-07-23 (×4): 500 mg via ORAL
  Filled 2016-07-22 (×4): qty 1

## 2016-07-22 NOTE — Progress Notes (Signed)
ANTICOAGULATION CONSULT NOTE - [place] for IV Heparin Indication: DVT/PE  Allergies  Allergen Reactions  . Enalapril Maleate Cough  . Hydrochlorothiazide Other (See Comments)    Reaction:  Low potassium levels   . Hydroxychloroquine Sulfate Other (See Comments)    Reaction:  Vision changes   . Latex Hives  . Nickel Other (See Comments)    Reaction:  Blisters     Patient Measurements: Height: 5\' 7"  (170.2 cm) Weight: (!) 326 lb 8 oz (148.1 kg) IBW/kg (Calculated) : 61.6  Heparin dosing weight: 98 kg  Vital Signs: Temp: 98.1 F (36.7 C) (07/09 1426) Temp Source: Oral (07/09 1426) BP: 123/61 (07/09 1426) Pulse Rate: 93 (07/09 1426)  Labs:  Recent Labs  07/21/16 0212 07/22/16 0055 07/22/16 1353  HGB 12.4 11.5*  --   HCT 38.4 36.0  --   PLT 178 191  --   LABPROT 13.4 13.9  --   INR 1.02 1.07  --   HEPARINUNFRC  --  2.12* 1.44*  CREATININE 1.22* 1.01*  --     Estimated Creatinine Clearance: 95.6 mL/min (A) (by C-G formula based on SCr of 1.01 mg/dL (H)).   Medical History: Past Medical History:  Diagnosis Date  . Allergic rhinitis   . ALLERGIC RHINITIS 10/26/2009  . Anxiety   . ANXIETY 08/14/2006  . B12 DEFICIENCY 08/25/2007  . Confusion   . Depression    takes Lexapro daily  . Diabetes mellitus without complication ([place])    was on insulin but has been off since Nov 2015 and now only takes Metformin daily  . DYSPNEA 04/28/2009   with exertion  . Esophageal reflux    takes Nexium daily  . Fibromyalgia   . Headache    last migraine 2-55yrs ago;takes Topamax daily  . History of shingles   . Hypertension    takes Coreg daily  . Insomnia    takes Nortriptyline nightly   . Joint pain   . Joint swelling   . Left knee pain   . Lichen planus   . Long-term memory loss   . Nocturia   . OSA (obstructive sleep apnea)    doesn't use CPAP;sleep study in epic from 2006  . Osteoarthritis   . Osteoarthritis   . Pneumonia    over 30  yrs ago  . Protein calorie malnutrition ([place])   . Rheumatoid arthritis ([place])   . Sarcoidosis    Dr. Lolita Patella  . Short-term memory loss   . Shortness of breath   . TIA (transient ischemic attack)   . Unsteady gait   . Urinary urgency   . Vitamin D deficiency    is supposed to take Vit D but can't afford it  . VITAMIN D DEFICIENCY 08/25/2007    Assessment:  56 yr female presented with right leg pain and swelling.  PMH significant for PE and completed a course of Xarelto this past spring (April 2018).    Elevated D-dimer  CTAngio + PE       Dopplers + DVT right lower extremity  Patient received Enoxaparin 145mg  SQ x 1 dose in ED @ 0725 on 7/8  Pharmacy initially consulted to dose Xarelto for DVT/PE, then therapy plan changed and pharmacy consulted to dose IV heparin (Xarelto consult d/c'ed).  Today, 07/22/16:  Heparin level at 1353 = 1.44 units/mL, remains supratherapeutic despite decrease in heparin infusion rate this AM to 1500 units/hr  CBC: Hgb decreased to 11.5, Pltc WNL  No bleeding  or IV site issues reported per nursing  Per RN, heparin level drawn from arm opposite of where heparin infusion running   Goal of Therapy:  Heparin level 0.3-0.7 units/mL Monitor platelets by anticoagulation protocol: Yes   Plan:   Stop heparin infusion x 1 hour.  Resume heparin infusion at 5pm at 1200 units/hr.  Check heparin level 6 hours after rate change.   Daily CBC and heparin level.   Monitor closely for s/sx of bleeding.    Lindell Spar, PharmD, BCPS Pager: [phone] 07/22/2016 4:00 PM

## 2016-07-22 NOTE — Progress Notes (Signed)
Pt is covered by Medicare and Medicaid for medications.

## 2016-07-22 NOTE — Progress Notes (Signed)
PROGRESS NOTE    [name]  EPP:[id number] DOB: Apr 08, 1960 DOA: 07/20/2016 PCP: Cassandria Anger, MD  Outpatient Specialists:   [facility] dermatology Dr. Costella Hatcher  Brief Narrative:  11 ? Obese, BMI>40  Large amount weight gain since restarting prednisone 4/16 Fibromyalgia DM TY II Bipolar Sarcoid of the skin??-Apparently follows [facility]-last office visit 07/01/2016  Long history pulmonary/cutaneous sarcoid 2003, + - prednisone  Previously on methotrexate-stopped as was not helping  Hydroxychloroquine also not helpful--currently on Humira Previous provoked pulmonary embolism 2017 Prior stroke in the past [facility] = MRI spine = mild to moderate disc protrusion/left-sided spinal stenosis-chronic pain PLT 2016 F VC 65%, ratio 69, FEV1 58%, DLCO 62% VQ scan 08/17/2015 = left lower extremity and right upper lobe acute pulmonary embolism  Admitted 07/21/2016 with one day history of right lower extremity swelling progressed over 2-3 days increasing redness no fever no chills-completed Xarelto 04/2016 for prior pulmonary embolism  Found to have elevated d-dimer CT scan = chronic appearing subsegmental PE Vascular consulted-did not feel candidate for streptokinase or lytic   Assessment & Plan:   Principal Problem:   Pulmonary embolism (HCC) Active Problems:   Sarcoidosis   B12 deficiency   Major depressive disorder, recurrent episode ([place])   Essential hypertension   GERD   Obesity   Diabetes mellitus type 2 with complications ([place])   DVT (deep venous thrombosis) (HCC)   Right lower extremity occlusive DVT  Appreciate vascular input-I called him personally Dr. Oneida Alar who concurs that risk outweigh the benefits for lytics  Patient at high risk for post-thrombotic syndrome  Compression stockings  Per ACC and chest guidelines technically can attempt to transition to oral-however intensity of swelling from my perspective necessitating IV heparin at least until  07/23/2016  Needs lifelong anticoagulation  Up with therapy-do not think that the patient will be able to go home right away and probably will  need skilled placement  Pain control with tramadol 50-->100 every 6 scheduled today  Not infectious from my perspective, discontinue Keflex started on admission  Pulmonary/cutaneous sarcoid  On Humira-cc, neurologist Dr. Elsworth Soho  Continue low-dose prednisone 5 mg every morning  Continue Lidex 1 application twice a day for sores and gums  Bipolar  Continue Effexor 75 every morning  Continue Pamelor 75 at bedtime  ContinueSuvrexant 1 mg at bedtime  Diabetes mellitus2 complication neuropathy  Holding PTA metformin 1000 a.m.  Placed on sliding scale coverage insulin-monitor trends 148---229 so far  HTN  Continue Coreg 6.25 ii/day, holding PTA Lasix  Headaches  Continue Topamax 1 tablet twice a day  Osteoarthritis  Continue gabapentin as above in addition to voltaren gl     Patient on IV heparin Inpatient Discontinue telemetry Expect will need skilled facility placement given pain    Consultants:   Vascular surgery  Procedures:   None as yet  Antimicrobials:   Keflex but discontinued on 7/9    Subjective:  Severe lower extremity pain, cannot place put on floor No fever no chills No nausea no vomiting eating and drinking No chest pain No blurred vision No double vision States the swelling has gone down since admission Tells me that tramadol is not covering her pain completely-1 and as if she can get anything else No dark or tarry stool  Objective: Vitals:   07/21/16 2119 07/22/16 0629 07/22/16 0639 07/22/16 1426  BP: 107/69 118/67  123/61  Pulse: 80 95  93  Resp: 20 20  18   Temp: 97.6 F (36.4 C) 98.3  F (36.8 C)  98.1 F (36.7 C)  TempSrc: Oral Oral  Oral  SpO2: 90% (!) 81% 92% 96%  Weight:      Height:        Intake/Output Summary (Last 24 hours) at 07/22/16 1535 Last data filed at 07/22/16 0600  Gross  per 24 hour  Intake           517.99 ml  Output                0 ml  Net           517.99 ml   Filed Weights   07/21/16 0828  Weight: (!) 148.1 kg (326 lb 8 oz)    Examination: Morbidly obese Mallampati 4 No JVD No bruit Chest is clinically clear although limited exam given habitus S1-S2 slightly tachycardic Abdomen is obese nontender cannot assess for organomegaly Right lower extremity is significantly swollen compared to left-appears red throughout and swollen. Very painful to touch. Patient did not attempt to place lying on the ground Neurologically intact Euthymic and congruent    Data Reviewed: I have personally reviewed following labs and imaging studies  CBC:  Recent Labs Lab 07/21/16 0212 07/22/16 0055  WBC 13.7* 10.5  NEUTROABS 9.5*  --   HGB 12.4 11.5*  HCT 38.4 36.0  MCV 92.3 94.0  PLT 178 756   Basic Metabolic Panel:  Recent Labs Lab 07/21/16 0212 07/22/16 0055  NA 137 137  K 3.5 3.8  CL 104 104  CO2 24 25  GLUCOSE 154* 148*  BUN 23* 19  CREATININE 1.22* 1.01*  CALCIUM 8.7* 8.5*  MG  --  2.2   GFR: Estimated Creatinine Clearance: 95.6 mL/min (A) (by C-G formula based on SCr of 1.01 mg/dL (H)). Liver Function Tests:  Recent Labs Lab 07/21/16 0212 07/22/16 0055  AST 15 24  ALT 17 19  ALKPHOS 64 59  BILITOT 0.5 0.3  PROT 6.7 6.4*  ALBUMIN 2.9* 2.6*   No results for input(s): LIPASE, AMYLASE in the last 168 hours. No results for input(s): AMMONIA in the last 168 hours. Coagulation Profile:  Recent Labs Lab 07/21/16 0212 07/22/16 0055  INR 1.02 1.07   Cardiac Enzymes: No results for input(s): CKTOTAL, CKMB, CKMBINDEX, TROPONINI in the last 168 hours. BNP (last 3 results) No results for input(s): PROBNP in the last 8760 hours. HbA1C: No results for input(s): HGBA1C in the last 72 hours. CBG:  Recent Labs Lab 07/21/16 1028 07/21/16 1652 07/21/16 2116 07/22/16 0742 07/22/16 1142  GLUCAP 197* 196* 205* 148* 229*    Lipid Profile: No results for input(s): CHOL, HDL, LDLCALC, TRIG, CHOLHDL, LDLDIRECT in the last 72 hours. Thyroid Function Tests: No results for input(s): TSH, T4TOTAL, FREET4, T3FREE, THYROIDAB in the last 72 hours. Anemia Panel: No results for input(s): VITAMINB12, FOLATE, FERRITIN, TIBC, IRON, RETICCTPCT in the last 72 hours. Urine analysis:    Component Value Date/Time   COLORURINE YELLOW 08/18/2015 0558   APPEARANCEUR CLOUDY (A) 08/18/2015 0558   LABSPEC 1.020 08/18/2015 0558   PHURINE 5.5 08/18/2015 0558   GLUCOSEU NEGATIVE 08/18/2015 0558   GLUCOSEU NEGATIVE 04/27/2013 1218   HGBUR NEGATIVE 08/18/2015 0558   BILIRUBINUR NEGATIVE 08/18/2015 0558   KETONESUR NEGATIVE 08/18/2015 0558   PROTEINUR NEGATIVE 08/18/2015 0558   UROBILINOGEN 1.0 11/08/2014 1050   NITRITE NEGATIVE 08/18/2015 0558   LEUKOCYTESUR NEGATIVE 08/18/2015 0558   Sepsis Labs: @LABRCNTIP (procalcitonin:4,lacticidven:4)  ) Recent Results (from the past 240 hour(s))  Blood culture (routine x  2)     Status: None (Preliminary result)   Collection Time: 07/21/16  3:45 AM  Result Value Ref Range Status   Specimen Description BLOOD BLOOD LEFT FOREARM  Final   Special Requests IN PEDIATRIC BOTTLE Blood Culture adequate volume  Final   Culture  Setup Time   Final    GRAM POSITIVE COCCI IN CLUSTERS IN PEDIATRIC BOTTLE CRITICAL RESULT CALLED TO, READ BACK BY AND VERIFIED WITH: Minette Brine AT 5427 07/22/16 BY L BENFIELD Performed at [facility], [place] [address], [place], [place] [address]    Culture GRAM POSITIVE COCCI  Final   Report Status PENDING  Incomplete  Blood Culture ID Panel (Reflexed)     Status: Abnormal   Collection Time: 07/21/16  3:45 AM  Result Value Ref Range Status   Enterococcus species NOT DETECTED NOT DETECTED Final   Listeria monocytogenes NOT DETECTED NOT DETECTED Final   Staphylococcus species DETECTED (A) NOT DETECTED Final    Comment: Methicillin (oxacillin) resistant  coagulase negative staphylococcus. Possible blood culture contaminant (unless isolated from more than one blood culture draw or clinical case suggests pathogenicity). No antibiotic treatment is indicated for blood  culture contaminants. CRITICAL RESULT CALLED TO, READ BACK BY AND VERIFIED WITH: M RENZ,PHARMD AT 6283 07/22/16 BY L BENFIELD    Staphylococcus aureus NOT DETECTED NOT DETECTED Final   Methicillin resistance DETECTED (A) NOT DETECTED Final    Comment: CRITICAL RESULT CALLED TO, READ BACK BY AND VERIFIED WITH: Minette Brine AT 1517 07/22/16 BY L BENFIELD    Streptococcus species NOT DETECTED NOT DETECTED Final   Streptococcus agalactiae NOT DETECTED NOT DETECTED Final   Streptococcus pneumoniae NOT DETECTED NOT DETECTED Final   Streptococcus pyogenes NOT DETECTED NOT DETECTED Final   Acinetobacter baumannii NOT DETECTED NOT DETECTED Final   Enterobacteriaceae species NOT DETECTED NOT DETECTED Final   Enterobacter cloacae complex NOT DETECTED NOT DETECTED Final   Escherichia coli NOT DETECTED NOT DETECTED Final   Klebsiella oxytoca NOT DETECTED NOT DETECTED Final   Klebsiella pneumoniae NOT DETECTED NOT DETECTED Final   Proteus species NOT DETECTED NOT DETECTED Final   Serratia marcescens NOT DETECTED NOT DETECTED Final   Haemophilus influenzae NOT DETECTED NOT DETECTED Final   Neisseria meningitidis NOT DETECTED NOT DETECTED Final   Pseudomonas aeruginosa NOT DETECTED NOT DETECTED Final   Candida albicans NOT DETECTED NOT DETECTED Final   Candida glabrata NOT DETECTED NOT DETECTED Final   Candida krusei NOT DETECTED NOT DETECTED Final   Candida parapsilosis NOT DETECTED NOT DETECTED Final   Candida tropicalis NOT DETECTED NOT DETECTED Final    Comment: Performed at [facility], [address] [place], [place] [address]  Blood culture (routine x 2)     Status: None (Preliminary result)   Collection Time: 07/21/16  3:50 AM  Result Value Ref Range Status   Specimen  Description BLOOD BLOOD RIGHT FOREARM  Final   Special Requests   Final    BOTTLES DRAWN AEROBIC AND ANAEROBIC Blood Culture adequate volume   Culture   Final    NO GROWTH 1 DAY Performed at [facility], [place] [address], [place], [place] [address]    Report Status PENDING  Incomplete         Radiology Studies: Ct Angio Chest Pe W And/or Wo Contrast  Result Date: 07/21/2016 CLINICAL DATA:  Acute onset of right hip pain, extending to the foot, with soft tissue swelling. Elevated D-dimer. Current history of sarcoidosis.  Initial encounter. EXAM: CT ANGIOGRAPHY CHEST WITH CONTRAST TECHNIQUE: Multidetector CT imaging of the chest was performed using the standard protocol during bolus administration of intravenous contrast. Multiplanar CT image reconstructions and MIPs were obtained to evaluate the vascular anatomy. CONTRAST:  100 mL of Isovue 370 IV contrast COMPARISON:  Chest radiograph performed 07/11/2016, and CT of the chest performed 02/02/2013 FINDINGS: Cardiovascular: There is a small chronic appearing segmental pulmonary embolus within a pulmonary artery branch to the right lower lobe. There is apparent mild stricturing of pulmonary arterial and venous structures at the hila bilaterally, likely corresponding to the patient's sarcoidosis. Emptying of the pulmonary veins is difficult to fully assess at the hila. Diffuse coronary artery calcifications are seen. The heart remains normal in size. Mild calcification is noted along the aortic arch. The great vessels are unremarkable in appearance. Mediastinum/Nodes: Numerous calcified mediastinal and hilar nodes are noted, measuring up to 1.7 cm in short axis. No pericardial effusion is identified. The visualized portions of the thyroid gland are unremarkable. No axillary lymphadenopathy is seen. Lungs/Pleura: Scattered bilateral pulmonary nodules and mild posterior right upper lobe scarring is noted, likely corresponding to the patient's  sarcoidosis. No pleural effusion or pneumothorax is seen. Evaluation for mass is limited given the patient's underlying sarcoidosis. Upper Abdomen: The visualized portions of the liver and spleen are grossly unremarkable. The visualized portions of the gallbladder, pancreas, adrenal glands and kidneys are within normal limits. Musculoskeletal: No acute osseous abnormalities are identified. The visualized musculature is unremarkable in appearance. Review of the MIP images confirms the above findings. IMPRESSION: 1. Small chronic appearing segmental pulmonary embolus within a pulmonary artery branch to the right lower lung lobe. 2. Apparent mild stricturing of pulmonary arterial and venous structures at the hila bilaterally, likely corresponding to the patient's sarcoidosis. Diffuse surrounding chronic calcified mediastinal and hilar lymphadenopathy. 3. Scattered bilateral pulmonary nodules and mild posterior right upper lobe scarring, likely reflecting the patient's sarcoidosis. 4. Diffuse coronary artery calcifications noted. These results were called by telephone at the time of interpretation on 07/21/2016 at 5:37 am to Dr. Duffy Bruce, who verbally acknowledged these results. Electronically Signed   By: Garald Balding M.D.   On: 07/21/2016 05:39        Scheduled Meds: . carvedilol  6.25 mg Oral BID WC  . cephALEXin  500 mg Oral Q6H  . gabapentin  300 mg Oral BID  . insulin aspart  0-5 Units Subcutaneous QHS  . insulin aspart  0-9 Units Subcutaneous TID WC  . nortriptyline  75 mg Oral QHS  . pantoprazole  40 mg Oral Daily  . predniSONE  5 mg Oral Q breakfast  . sodium chloride flush  3 mL Intravenous Q12H  . Suvorexant  1 tablet Oral QHS  . topiramate  50 mg Oral BID  . traMADol  100 mg Oral Q6H  . venlafaxine XR  75 mg Oral Q breakfast   Continuous Infusions: . heparin 1,500 Units/hr (07/22/16 0536)     LOS: 1 day    Time spent: [place], MD [facility] ([phone]   If 7PM-7AM, please contact night-coverage [email or web address] Password [email or web address] 07/22/2016, 3:35 PM

## 2016-07-22 NOTE — Patient Outreach (Signed)
[facility]) Care Management  07/22/2016  [name] 28-Jan-1960 [id number]   Patient presented to the Emergency Department at [facility] on Saturday, July 7th complaining of hip pain.  Patient was found to have extensive occlusive deep vein thrombosis throughout the right lower extremity. Patient remains hospitalized at present.  CSW will continue to follow to assess and assist with any social work planning needs and/or services.   [name], BSW, MSW, LCSW  Licensed Education officer, [facility]  Mailing [place] [address] [place], [place] [address] Physical Address-[address], [place], [place] [address] Toll Free Main # [phone] Fax # [phone] Cell # [phone]  Office # [phone] [name]

## 2016-07-22 NOTE — Evaluation (Signed)
Physical Therapy Evaluation Patient Details Name: [name] MRN: [id number] DOB: 1960/12/16 Today's Date: 07/22/2016   History of Present Illness  56 y.o. female with Past medical history of sarcoidosis, on Humira, morbid obesity, type 2 diabetes mellitus, depression, B 12 deficiency, OSA not on C Pap, L TKA and presented with R LE swelling, found to have Right iliofemoral DVT on [place].  CT Angio Chest: Small chronic appearing segmental pulmonary embolus   Clinical Impression  Pt admitted with above diagnosis. Pt currently with functional limitations due to the deficits listed below (see PT Problem List).  Pt will benefit from skilled PT to increase their independence and safety with mobility to allow discharge to the venue listed below.  Pt reports hx of PE after TKA surgery.  Pt assisted with ambulating however distance limited due to R LE pain and SOB.  Pt's SPO2 85% on room air upon sitting in recliner so 1L O2 [place] applied once pt returned to room (SPO2 improved to 95%).  Pt reports she lives alone and is agreeable to SNF at this time due to decreased mobility and reports she required assist for ADLs with NT prior to session.     Follow Up Recommendations SNF;Supervision/Assistance - 24 hour    Equipment Recommendations  None recommended by PT    Recommendations for Other Services       Precautions / Restrictions Precautions Precautions: Fall Precaution Comments: monitor sats  Pt wearing compression stockings for mobility.     Mobility  Bed Mobility Overal bed mobility: Needs Assistance Bed Mobility: Supine to Sit     Supine to sit: Min guard;HOB elevated     General bed mobility comments: pt self assisted R LE with UEs  Transfers Overall transfer level: Needs assistance Equipment used: Rolling walker (2 wheeled) Transfers: Sit to/from Stand Sit to Stand: Min guard;From elevated surface         General transfer comment: verbal cues for safe  technique  Ambulation/Gait Ambulation/Gait assistance: Min guard Ambulation Distance (Feet): 15 Feet Assistive device: Rolling walker (2 wheeled) Gait Pattern/deviations: Step-to pattern;Decreased stance time - right;Antalgic     General Gait Details: distance limited by R LE pain, also reports SOB, SPO2 85% on room air upon sitting in recliner, brought back to room and applied 1L O2 [place] for SpO2 95% (RN notified and aware)  Stairs            Wheelchair Mobility    Modified Rankin (Stroke Patients Only)       Balance                                             Pertinent Vitals/Pain Pain Assessment: 0-10 Pain Score: 7  Pain Location: R entire leg Pain Descriptors / Indicators: Sore;Aching;Tightness Pain Intervention(s): Monitored during session;Repositioned;Limited activity within patient's tolerance    Home Living Family/patient expects to be discharged to:: Private residence Living Arrangements: Alone   Type of Home: Apartment Home Access: Level entry     Home Layout: One level Home Equipment: Environmental consultant - 2 wheels;Walker - 4 wheels;Cane - quad      Prior Function Level of Independence: Independent with assistive device(s)         Comments: started using rollator last week when her R leg started hurting, no AD prior to this     Hand Dominance  Extremity/Trunk Assessment        Lower Extremity Assessment Lower Extremity Assessment: RLE deficits/detail;Generalized weakness RLE Deficits / Details: significant R LE edema, difficulty moving against gravity        Communication   Communication: No difficulties  Cognition Arousal/Alertness: Awake/alert Behavior During Therapy: WFL for tasks assessed/performed Overall Cognitive Status: Within Functional Limits for tasks assessed                                        General Comments      Exercises     Assessment/Plan    PT Assessment Patient needs  continued PT services  PT Problem List Decreased strength;Decreased mobility;Pain;Decreased activity tolerance       PT Treatment Interventions Gait training;DME instruction;Therapeutic activities;Therapeutic exercise;Patient/family education;Functional mobility training    PT Goals (Current goals can be found in the Care Plan section)  Acute Rehab PT Goals PT Goal Formulation: With patient Time For Goal Achievement: 08/05/16 Potential to Achieve Goals: Good    Frequency Min 3X/week   Barriers to discharge        Co-evaluation               AM-PAC PT "6 Clicks" Daily Activity  Outcome Measure Difficulty turning over in bed (including adjusting bedclothes, sheets and blankets)?: None Difficulty moving from lying on back to sitting on the side of the bed? : A Little Difficulty sitting down on and standing up from a chair with arms (e.g., wheelchair, bedside commode, etc,.)?: A Little Help needed moving to and from a bed to chair (including a wheelchair)?: A Little Help needed walking in hospital room?: A Lot Help needed climbing 3-5 steps with a railing? : Total 6 Click Score: 16    End of Session Equipment Utilized During Treatment: Gait belt;Oxygen Activity Tolerance: Patient limited by pain Patient left: in chair;with call bell/phone within reach (pt agreeable to use call bell for out of chair) Nurse Communication: Other (comment) (applied O2 [place]) PT Visit Diagnosis: Difficulty in walking, not elsewhere classified (R26.2)    Time: 3729-0211 PT Time Calculation (min) (ACUTE ONLY): 25 min   Charges:   PT Evaluation $PT Eval Low Complexity: 1 Procedure     PT G CodesCarmelia Bake, PT, DPT 07/22/2016 Pager: [phone]  [place] Ram E 07/22/2016, 3:24 PM

## 2016-07-22 NOTE — Progress Notes (Signed)
PHARMACY - PHYSICIAN COMMUNICATION CRITICAL VALUE ALERT - BLOOD CULTURE IDENTIFICATION (BCID)  Results for orders placed or performed during the hospital encounter of 07/20/16  Blood Culture ID Panel (Reflexed) (Collected: 07/21/2016  3:45 AM)  Result Value Ref Range   Enterococcus species NOT DETECTED NOT DETECTED   Listeria monocytogenes NOT DETECTED NOT DETECTED   Staphylococcus species DETECTED (A) NOT DETECTED   Staphylococcus aureus NOT DETECTED NOT DETECTED   Methicillin resistance DETECTED (A) NOT DETECTED   Streptococcus species NOT DETECTED NOT DETECTED   Streptococcus agalactiae NOT DETECTED NOT DETECTED   Streptococcus pneumoniae NOT DETECTED NOT DETECTED   Streptococcus pyogenes NOT DETECTED NOT DETECTED   Acinetobacter baumannii NOT DETECTED NOT DETECTED   Enterobacteriaceae species NOT DETECTED NOT DETECTED   Enterobacter cloacae complex NOT DETECTED NOT DETECTED   Escherichia coli NOT DETECTED NOT DETECTED   Klebsiella oxytoca NOT DETECTED NOT DETECTED   Klebsiella pneumoniae NOT DETECTED NOT DETECTED   Proteus species NOT DETECTED NOT DETECTED   Serratia marcescens NOT DETECTED NOT DETECTED   Haemophilus influenzae NOT DETECTED NOT DETECTED   Neisseria meningitidis NOT DETECTED NOT DETECTED   Pseudomonas aeruginosa NOT DETECTED NOT DETECTED   Candida albicans NOT DETECTED NOT DETECTED   Candida glabrata NOT DETECTED NOT DETECTED   Candida krusei NOT DETECTED NOT DETECTED   Candida parapsilosis NOT DETECTED NOT DETECTED   Candida tropicalis NOT DETECTED NOT DETECTED    Name of physician (or Provider) Contacted: Dr. Verlon Au  Changes to prescribed antibiotics required: none  Hershal Coria 07/22/2016  10:21 AM

## 2016-07-22 NOTE — Progress Notes (Signed)
ANTICOAGULATION CONSULT NOTE - Follow Up Consult  Pharmacy Consult for Heparin Indication: pulmonary embolus and DVT  Allergies  Allergen Reactions  . Enalapril Maleate Cough  . Hydrochlorothiazide Other (See Comments)    Reaction:  Low potassium levels   . Hydroxychloroquine Sulfate Other (See Comments)    Reaction:  Vision changes   . Latex Hives  . Nickel Other (See Comments)    Reaction:  Blisters     Patient Measurements: Height: 5\' 7"  (170.2 cm) Weight: (!) 326 lb 8 oz (148.1 kg) IBW/kg (Calculated) : 61.6 Heparin Dosing Weight:   Vital Signs: Temp: 97.6 F (36.4 C) (07/08 2119) Temp Source: Oral (07/08 2119) BP: 107/69 (07/08 2119) Pulse Rate: 80 (07/08 2119)  Labs:  Recent Labs  07/21/16 0212 07/22/16 0055  HGB 12.4 11.5*  HCT 38.4 36.0  PLT 178 191  LABPROT 13.4 13.9  INR 1.02 1.07  HEPARINUNFRC  --  2.12*  CREATININE 1.22* 1.01*    Estimated Creatinine Clearance: 95.6 mL/min (A) (by C-G formula based on SCr of 1.01 mg/dL (H)).   Medications:  Infusions:  .  ceFAZolin (ANCEF) IV Stopped (07/21/16 2035)  . heparin      Assessment: Patient with very high heparin level.  No heparin issues per RN.  Comfirmed with lab that level was drawn correctly.  Feel that prior enoxaparin could have an effect, but last dose > 12hr and good renal function noted.  Goal of Therapy:  Heparin level 0.3-0.7 units/ml Monitor platelets by anticoagulation protocol: Yes   Plan:  Hold heparin until 0530 Restart heparin at 0530 at rate of 1500 units/hr Recheck level ~6-7hr after restart, at 1300.  Tyler Deis, Shea Stakes [place] 07/22/2016,4:10 AM

## 2016-07-23 ENCOUNTER — Other Ambulatory Visit: Payer: Self-pay | Admitting: Pharmacist

## 2016-07-23 DIAGNOSIS — R600 Localized edema: Secondary | ICD-10-CM | POA: Diagnosis not present

## 2016-07-23 DIAGNOSIS — D869 Sarcoidosis, unspecified: Secondary | ICD-10-CM | POA: Diagnosis not present

## 2016-07-23 DIAGNOSIS — E118 Type 2 diabetes mellitus with unspecified complications: Secondary | ICD-10-CM | POA: Diagnosis not present

## 2016-07-23 DIAGNOSIS — R2689 Other abnormalities of gait and mobility: Secondary | ICD-10-CM | POA: Diagnosis not present

## 2016-07-23 DIAGNOSIS — Z7901 Long term (current) use of anticoagulants: Secondary | ICD-10-CM | POA: Diagnosis not present

## 2016-07-23 DIAGNOSIS — I999 Unspecified disorder of circulatory system: Secondary | ICD-10-CM | POA: Diagnosis not present

## 2016-07-23 DIAGNOSIS — M6281 Muscle weakness (generalized): Secondary | ICD-10-CM | POA: Diagnosis not present

## 2016-07-23 DIAGNOSIS — I2699 Other pulmonary embolism without acute cor pulmonale: Secondary | ICD-10-CM | POA: Diagnosis not present

## 2016-07-23 DIAGNOSIS — R5381 Other malaise: Secondary | ICD-10-CM | POA: Diagnosis not present

## 2016-07-23 DIAGNOSIS — I82409 Acute embolism and thrombosis of unspecified deep veins of unspecified lower extremity: Secondary | ICD-10-CM | POA: Diagnosis not present

## 2016-07-23 DIAGNOSIS — R2681 Unsteadiness on feet: Secondary | ICD-10-CM | POA: Diagnosis not present

## 2016-07-23 LAB — CBC
HCT: 34.6 % — ABNORMAL LOW (ref 36.0–46.0)
Hemoglobin: 10.9 g/dL — ABNORMAL LOW (ref 12.0–15.0)
MCH: 29.5 pg (ref 26.0–34.0)
MCHC: 31.5 g/dL (ref 30.0–36.0)
MCV: 93.5 fL (ref 78.0–100.0)
Platelets: 190 10*3/uL (ref 150–400)
RBC: 3.7 MIL/uL — AB (ref 3.87–5.11)
RDW: 14 % (ref 11.5–15.5)
WBC: 9.1 10*3/uL (ref 4.0–10.5)

## 2016-07-23 LAB — HEMOGLOBIN A1C
HEMOGLOBIN A1C: 7.1 % — AB (ref 4.8–5.6)
MEAN PLASMA GLUCOSE: 157 mg/dL

## 2016-07-23 LAB — HEPARIN LEVEL (UNFRACTIONATED): Heparin Unfractionated: 0.7 IU/mL (ref 0.30–0.70)

## 2016-07-23 LAB — GLUCOSE, CAPILLARY
Glucose-Capillary: 133 mg/dL — ABNORMAL HIGH (ref 65–99)
Glucose-Capillary: 198 mg/dL — ABNORMAL HIGH (ref 65–99)

## 2016-07-23 MED ORDER — NORTRIPTYLINE HCL 75 MG PO CAPS: 75.0000 mg | ORAL_CAPSULE | Freq: Every day | ORAL | 0 refills | Status: DC

## 2016-07-23 MED ORDER — SUVOREXANT 20 MG PO TABS: 1.0000 | ORAL_TABLET | Freq: Every day | ORAL | 0 refills | Status: DC

## 2016-07-23 MED ORDER — RIVAROXABAN (XARELTO) VTE STARTER PACK (15 & 20 MG): ORAL_TABLET | ORAL | 0 refills | Status: DC

## 2016-07-23 MED ORDER — RIVAROXABAN 15 MG PO TABS
15.0000 mg | ORAL_TABLET | Freq: Two times a day (BID) | ORAL | Status: DC
  Administered 2016-07-23: 15 mg via ORAL
  Filled 2016-07-23: qty 1

## 2016-07-23 MED ORDER — HEPARIN (PORCINE) IN NACL 100-0.45 UNIT/ML-% IJ SOLN: 1000.0000 [IU]/h | INTRAMUSCULAR | Status: DC

## 2016-07-23 MED ORDER — TRAMADOL HCL 50 MG PO TABS: 100.0000 mg | ORAL_TABLET | Freq: Four times a day (QID) | ORAL | 0 refills | Status: DC | PRN

## 2016-07-23 MED ORDER — METHOCARBAMOL 500 MG PO TABS: 500.0000 mg | ORAL_TABLET | Freq: Three times a day (TID) | ORAL | 0 refills | Status: DC | PRN

## 2016-07-23 MED ORDER — VENLAFAXINE HCL ER 75 MG PO CP24: 75.0000 mg | ORAL_CAPSULE | Freq: Every day | ORAL | 0 refills | Status: DC

## 2016-07-23 MED ORDER — OXYCODONE-ACETAMINOPHEN 5-325 MG PO TABS: 1.0000 | ORAL_TABLET | Freq: Four times a day (QID) | ORAL | 0 refills | Status: DC | PRN

## 2016-07-23 MED ORDER — OXYCODONE-ACETAMINOPHEN 5-325 MG PO TABS: 1.0000 | ORAL_TABLET | Freq: Four times a day (QID) | ORAL | Status: DC | PRN

## 2016-07-23 NOTE — Clinical Social Work Placement (Signed)
Pt discharging to transfer to [facility] for [place] rehab today. Report [phone]  Pt will transport via [place] completed medical necessity form and called to arrange transportation.  Family notified- pt's son's girlfriend Cyril Mourning (son at work) All information provided to facility via the [facility]  See below for placement details  CLINICAL SOCIAL WORK PLACEMENT  NOTE  Date:  07/23/2016  Patient Details  Name: [name] MRN: [id number] Date of Birth: 10-03-1960  Clinical Social Work is seeking post-discharge placement for this patient at the [place] level of care (*CSW will initial, date and re-position this form in  chart as items are completed):  Yes   Patient/family provided with [place] Work Department's list of facilities offering this level of care within the geographic area requested by the patient (or if unable, by the patient's family).  Yes   Patient/family informed of their freedom to choose among providers that offer the needed level of care, that participate in Medicare, Medicaid or managed care program needed by the patient, have an available bed and are willing to accept the patient.  Yes   Patient/family informed of [facility]'s ownership interest in [facility] and [facility], as well as of the fact that they are under no obligation to receive care at these facilities.  PASRR submitted to EDS on       PASRR number received on       Existing PASRR number confirmed on 07/23/16     FL2 transmitted to all facilities in geographic area requested by pt/family on 07/23/16     FL2 transmitted to all facilities within larger geographic area on       Patient informed that his/her managed care company has contracts with or will negotiate with certain facilities, including the following:        Yes   Patient/family informed of bed offers received.  Patient chooses bed at [facility]     Physician recommends and  patient chooses bed at [facility]    Patient to be transferred to [facility] on 07/23/16.  Patient to be transferred to facility by [facility]     Patient family notified on 07/23/16 of transfer.  Name of family member notified:  son's girlfriend Jordan     PHYSICIAN       Additional Comment:    _______________________________________________ Nila Nephew, LCSW 07/23/2016, 1:17 PM  [phone]

## 2016-07-23 NOTE — Patient Outreach (Signed)
[facility]) Care Management  07/23/2016  [place] 05-09-1960 [id number]   Reviewed chart to get ready to call patient and discovered she is still hospitalized. She will be discharged today to an SNF.  Plan:  I will follow up with the patient post discharge from SNF.   Elayne Guerin, PharmD, [place] Clinical Pharmacist [phone]

## 2016-07-23 NOTE — Clinical Social Work Note (Signed)
Clinical Social Work Assessment  Patient Details  Name: [name] MRN: [id number] Date of Birth: 03/10/1960  Date of referral:  07/23/16               Reason for consult:  Facility Placement                Permission sought to share information with:  Family Supports Permission granted to share information::  Yes, Verbal Permission Granted  Name::     son [name] & son's girlfriend [name]::     Relationship::     Contact Information:     Housing/Transportation Living arrangements for the past 2 months:  Single Family Home Source of Information:  Patient Patient Interpreter Needed:  None Criminal Activity/Legal Involvement Pertinent to Current Situation/Hospitalization:  No - Comment as needed Significant Relationships:  Adult Children, Friend Lives with:  Self Do you feel safe going back to the place where you live?  Yes Need for family participation in patient care:  No (Coment)  Care giving concerns:  Pt from home where she resides alone. Strong family support from son and son's girlfriend, who live nearby but are unable to be with pt extensively due to work schedule and caring for their baby at home. Pt states she was independent with ambulating (uses rolling walker) and ADLs prior to hospitalization. States she has used rolling walker since knee/hip fracture (?2017) and went to [place] rehab following those injuries, with positive results.    Social Worker assessment / plan:  CSW consulted for potential SNF placement. Met with pt at bedside and spoke with family via phone. Both familiar with [facility] re[facility] placement process due to pt having been to rehab 2017 at [facility]. [facility] or [facility] is preference.  CSW completed FL2 and made referrals.   Plan: SNF at [place]- will follow up with bed offers today.  Employment status:  Retired Forensic scientist:  [employer] PT Recommendations:  [place] / Referral to community resources:  [place]  Patient/Family's Response to care:  Engaged and appreciative of care  Patient/Family's Understanding of and Emotional Response to Diagnosis, Current Treatment, and Prognosis:  Pt demonstrates adequate understanding of plan during interaction, as did pt's family. Pt does state that she "wants to stay one more night in the hospital." CSW discussed that if pt deemed stable for transfer to SNF, there is need to proceed, and pt was understanding.   Emotional Assessment Appearance:  Appears stated age Attitude/Demeanor/Rapport:   (pleasant, engaged) Affect (typically observed):  Accepting, Adaptable, Calm Orientation:  Oriented to Self, Oriented to Place, Oriented to  Time, Oriented to Situation Alcohol / Substance use:  Not Applicable Psych involvement (Current and /or in the community):  No (Comment)  Discharge Needs  Concerns to be addressed:  Discharge Planning Concerns Readmission within the last 30 days:  No Current discharge risk:  None Barriers to Discharge:  No Barriers Identified   Angela Nephew, LCSW 07/23/2016, 11:01 AM  [phone]

## 2016-07-23 NOTE — Discharge Summary (Addendum)
Physician Discharge Summary  [name] KXF:[id number] DOB: [birth year] DOA: 07/20/2016  PCP: Cassandria Anger, MD  Admit date: 07/20/2016 Discharge date: 07/23/2016  Time spent: 45 minutes  1. Recommendations for Outpatient Follow-up: temporarily 2. Needs outpatient referral for potential polysomnogram-used to be on C Pap in the past 3. Recommend continuation of Xarelto lifelong-[starterpack prescribed on d/c 4. needs CBC plus Chem-12 + INR in about one week 5. Discharging to Skilled nursing facility later today 6. Will need skilled therapy and monitoring for post-thrombotic syndrome-recommend TED hose knee-high large ongoing to prevent the same-was not a candidate for lytics on admission 7. Please reassess abdominal hematoma-very low risk for retroperitoneal hemorrhage. 8. New medications Medications this admission include tramadol, Percocet, and Robaxin for lower extremity pain-prescription given also for her usual psychotropic medications 9. Patient is weightbearing as tolerated on the right lower extremity 10. Needs f/u Dr. Elsworth Soho CCM and [facility] derm as op--to be scheduled as OP from SNF  Discharge Diagnoses:  Principal Problem:   Pulmonary embolism ([place]) Active Problems:   Sarcoidosis   B12 deficiency   Major depressive disorder, recurrent episode ([place])   Essential hypertension   GERD   Obesity   Diabetes mellitus type 2 with complications ([place])   DVT (deep venous thrombosis) ([place])   Discharge Condition: improved  Diet recommendation: diabetic heart healthy  Filed Weights   07/21/16 0828 07/22/16 2139  Weight: (!) 148.1 kg (326 lb 8 oz) (!) 149.6 kg (329 lb 12.9 oz)    Hospital Course:  55 ? Obese, BMI>40             Large amount weight gain since restarting prednisone 4/16 Fibromyalgia DM TY II Bipolar Sarcoid of the skin??-Apparently follows [facility]-last office visit 07/01/2016             Long history pulmonary/cutaneous sarcoid 2003, + - prednisone              Previously on methotrexate-stopped as was not helping             Hydroxychloroquine also not helpful--currently on Humira Previous provoked pulmonary embolism 2017 Prior stroke in the past [place] = MRI spine = mild to moderate disc protrusion/left-sided spinal stenosis-chronic pain PLT 2016 F VC 65%, ratio 69, FEV1 58%, DLCO 62% VQ scan 08/17/2015 = left lower extremity and right upper lobe acute pulmonary embolism  Admitted 07/21/2016 with one day history of right lower extremity swelling progressed over 2-3 days increasing redness no fever no chills-completed Xarelto 04/2016 for prior pulmonary embolism  Found to have elevated d-dimer CT scan = chronic appearing subsegmental PE Vascular consulted-did not feel candidate for streptokinase or lytic  System oriented d/c   Right lower extremity occlusive DVT             Appreciate vascular input-I called him personally 7/9 Dr. Oneida Alar who concurs that risk outweigh the benefits for lytics             Patient at high risk for post-thrombotic syndrome             Compression stockings             Per ACC and chest guidelines technically can attempt to transition to oral-however intensity of swelling from my perspective necessitating IV heparin at least until 07/23/2016             Needs lifelong anticoagulation             Up with therapy--->SNF  Pain control with tramadol 50-->100 every 6 scheduled today, added percocet on d/c             Not infectious from my perspective, discontinue Keflex started on admission  Pulmonary/cutaneous sarcoid             On Humira-cc, neurologist Dr. Elsworth Soho             Continue low-dose prednisone 5 mg every morning             Continue Lidex 1 application twice a day for sores and gums  [facility]  Bipolar             Continue Effexor 75 every morning             Continue Pamelor 75 at bedtime             ContinueSuvrexant 1 mg at  bedtime  Diabetes mellitus2 complication neuropathy             Holding PTA metformin 1000 a.m.             Placed on sliding scale coverage insulin-trends well controlled this admit  HTN             Continue Coreg 6.25 ii/day, holding PTA Lasix  Resumed on d/c  Headaches             Continue Topamax 1 tablet twice a day  Osteoarthritis             Continue gabapentin as above in addition to voltaren gl               Discharge Exam: Vitals:   07/22/16 1426 07/22/16 2139  BP: 123/61 104/63  Pulse: 93 76  Resp: 18 18  Temp: 98.1 F (36.7 C) 98 F (36.7 C)    Alert pleasant oriented no apparent distress EOMI NCAT Neck soft supple in no significant distress Mallampati 4 Chest clinically clear no wheeze She does have a hematoma in the anterior aspect of her abdomen on the right head which is not enlarged past 24 hours Right lower extremity is significantly swollen  edematous Left lower extremity is not swollen has 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 Qty: 6 tablet, Refills: 0    oxyCODONE-acetaminophen (PERCOCET/ROXICET) 5-325 MG tablet Take 1 tablet by mouth every 6 (six) hours as needed for moderate pain or severe pain. Qty: 6 tablet, Refills: 0    Rivaroxaban 15 & 20 MG TBPK Take as directed on package: Start with one 15mg  tablet by mouth twice a day with food. On Day 22, switch to one 20mg  tablet once a day with food. Qty: 51 each, Refills: 0    traMADol (ULTRAM) 50 MG tablet Take 2 tablets (100 mg total) by mouth every 6 (six) hours as needed for moderate pain. Qty: 6  tablet, Refills: 0      CONTINUE these medications which have CHANGED   Details  nortriptyline (PAMELOR) 75 MG capsule Take 1 capsule (  75 mg total) by mouth at bedtime. Qty: 6 capsule, Refills: 0    Suvorexant (BELSOMRA) 20 MG TABS Take 1 tablet by mouth at bedtime. Qty: 6 tablet, Refills: 0    venlafaxine XR (EFFEXOR XR) 75 MG 24 hr capsule Take 1 capsule (75 mg total) by mouth daily with breakfast. Qty: 6 capsule, Refills: 0      CONTINUE these medications which have NOT CHANGED   Details  Adalimumab (HUMIRA) 40 MG/0.8ML PSKT Inject 40 mg into the skin every 14 (fourteen) days.    albuterol (PROVENTIL HFA;VENTOLIN HFA) 108 (90 Base) MCG/ACT inhaler Inhale 2 puffs into the lungs every 6 (six) hours as needed for wheezing or shortness of breath. Qty: 1 Inhaler, Refills: 3    carvedilol (COREG) 12.5 MG tablet TAKE 1 TABLET BY MOUTH TWICE DAILY WITH A MEAL Qty: 180 tablet, Refills: 2    chlorhexidine (PERIDEX) 0.12 % solution Use as directed 5 mLs in the mouth or throat 2 (two) times daily. Pt is to swish and spit. Refills: 0    diclofenac sodium (VOLTAREN) 1 % GEL Apply 2 g topically 2 (two) times daily. Qty: 1 Tube, Refills: 1    esomeprazole (NEXIUM) 40 MG capsule Take 40 mg by mouth daily before breakfast.     fluocinonide gel (LIDEX) 3.79 % Apply 1 application topically 2 (two) times daily as needed (for sores on gums).     furosemide (LASIX) 20 MG tablet Take 1 tablet (20 mg total) by mouth daily. Qty: 30 tablet, Refills: 3    gabapentin (NEURONTIN) 300 MG capsule Take 1 capsule (300 mg total) by mouth 2 (two) times daily. Qty: 60 capsule, Refills: 5    KLOR-CON 10 10 MEQ tablet TAKE 1 TABLET BY MOUTH DAILY Qty: 90 tablet, Refills: 3    lidocaine (XYLOCAINE) 2 % solution Use as directed 20 mLs in the mouth or throat 3 (three) times daily as needed for mouth pain. Pt is to swish and spit.    metFORMIN (GLUCOPHAGE-XR) 500 MG 24 hr tablet TAKE 2 TABLETS(1000 MG) BY  MOUTH DAILY WITH BREAKFAST Qty: 180 tablet, Refills: 0    predniSONE (DELTASONE) 5 MG tablet Take 5 mg by mouth daily with breakfast.    topiramate (TOPAMAX) 50 MG tablet TAKE 1 TABLET BY MOUTH TWICE DAILY Qty: 180 tablet, Refills: 2       Allergies  Allergen Reactions  . Enalapril Maleate Cough  . Hydrochlorothiazide Other (See Comments)    Reaction:  Low potassium levels   . Hydroxychloroquine Sulfate Other (See Comments)    Reaction:  Vision changes   . Latex Hives  . Nickel Other (See Comments)    Reaction:  Blisters       The results of significant diagnostics from this hospitalization (including imaging, microbiology, ancillary and laboratory) are listed below for reference.    Significant Diagnostic Studies: Dg Chest 2 View  Result Date: 07/11/2016 CLINICAL DATA:  Cough.  Sarcoidosis . EXAM: CHEST  2 VIEW COMPARISON:  06/07/2016.  01/30/2016. FINDINGS: Stable bilateral hilar fullness consistent with stable adenopathy in this patient with known sarcoid . Stable chronic interstitial changes in the right apex and right base PEs most likely also related to the patient's sarcoidosis. No acute alveolar infiltrate. No pleural effusion or pneumothorax. Stable cardiomegaly. IMPRESSION: 1. Stable changes of hilar adenopathy and chronic interstitial lung disease consistent patient's known sarcoidosis. No acute alveolar infiltrate. 2. Stable cardiomegaly.  No pulmonary venous congestion . Electronically Signed   By: Marcello Moores  Register   On: 07/11/2016 14:40   Ct Angio Chest Pe W And/or Wo Contrast  Result Date: 07/21/2016 CLINICAL DATA:  Acute onset of right hip pain, extending to the foot, with soft tissue swelling. Elevated D-dimer. Current history of sarcoidosis. Initial encounter. EXAM: CT ANGIOGRAPHY CHEST WITH CONTRAST TECHNIQUE: Multidetector CT imaging of the chest was performed using the standard protocol during bolus administration of intravenous contrast. Multiplanar CT image  reconstructions and MIPs were obtained to evaluate the vascular anatomy. CONTRAST:  100 mL of Isovue 370 IV contrast COMPARISON:  Chest radiograph performed 07/11/2016, and CT of the chest performed 02/02/2013 FINDINGS: Cardiovascular: There is a small chronic appearing segmental pulmonary embolus within a pulmonary artery branch to the right lower lobe. There is apparent mild stricturing of pulmonary arterial and venous structures at the hila bilaterally, likely corresponding to the patient's sarcoidosis. Emptying of the pulmonary veins is difficult to fully assess at the hila. Diffuse coronary artery calcifications are seen. The heart remains normal in size. Mild calcification is noted along the aortic arch. The great vessels are unremarkable in appearance. Mediastinum/Nodes: Numerous calcified mediastinal and hilar nodes are noted, measuring up to 1.7 cm in short axis. No pericardial effusion is identified. The visualized portions of the thyroid gland are unremarkable. No axillary lymphadenopathy is seen. Lungs/Pleura: Scattered bilateral pulmonary nodules and mild posterior right upper lobe scarring is noted, likely corresponding to the patient's sarcoidosis. No pleural effusion or pneumothorax is seen. Evaluation for mass is limited given the patient's underlying sarcoidosis. Upper Abdomen: The visualized portions of the liver and spleen are grossly unremarkable. The visualized portions of the gallbladder, pancreas, adrenal glands and kidneys are within normal limits. Musculoskeletal: No acute osseous abnormalities are identified. The visualized musculature is unremarkable in appearance. Review of the MIP images confirms the above findings. IMPRESSION: 1. Small chronic appearing segmental pulmonary embolus within a pulmonary artery branch to the right lower lung lobe. 2. Apparent mild stricturing of pulmonary arterial and venous structures at the hila bilaterally, likely corresponding to the patient's  sarcoidosis. Diffuse surrounding chronic calcified mediastinal and hilar lymphadenopathy. 3. Scattered bilateral pulmonary nodules and mild posterior right upper lobe scarring, likely reflecting the patient's sarcoidosis. 4. Diffuse coronary artery calcifications noted. These results were called by telephone at the time of interpretation on 07/21/2016 at 5:37 am to Dr. Duffy Bruce, who verbally acknowledged these results. Electronically Signed   By: Garald Balding M.D.   On: 07/21/2016 05:39    Microbiology: Recent Results (from the past 240 hour(s))  Blood culture (routine x 2)     Status: Abnormal (Preliminary result)   Collection Time: 07/21/16  3:45 AM  Result Value Ref Range Status   Specimen Description BLOOD BLOOD LEFT FOREARM  Final   Special Requests IN PEDIATRIC BOTTLE Blood Culture adequate volume  Final   Culture  Setup Time   Final    GRAM POSITIVE COCCI IN CLUSTERS IN PEDIATRIC BOTTLE CRITICAL RESULT CALLED TO, READ BACK BY AND VERIFIED WITH: Minette Brine AT 7001 07/22/16 BY L BENFIELD Performed at [facility], [place] [address] [place], [place] [address]    Culture STAPHYLOCOCCUS SPECIES (COAGULASE NEGATIVE) (A)  Final   Report Status PENDING  Incomplete  Blood Culture ID Panel (Reflexed)     Status: Abnormal   Collection Time: 07/21/16  3:45 AM  Result Value Ref Range Status   Enterococcus species NOT DETECTED NOT DETECTED Final   Listeria monocytogenes NOT DETECTED NOT DETECTED Final   Staphylococcus  species DETECTED (A) NOT DETECTED Final    Comment: Methicillin (oxacillin) resistant coagulase negative staphylococcus. Possible blood culture contaminant (unless isolated from more than one blood culture draw or clinical case suggests pathogenicity). No antibiotic treatment is indicated for blood  culture contaminants. CRITICAL RESULT CALLED TO, READ BACK BY AND VERIFIED WITH: M RENZ,PHARMD AT 5465 07/22/16 BY L BENFIELD    Staphylococcus aureus NOT DETECTED NOT  DETECTED Final   Methicillin resistance DETECTED (A) NOT DETECTED Final    Comment: CRITICAL RESULT CALLED TO, READ BACK BY AND VERIFIED WITH: Minette Brine AT 0354 07/22/16 BY L BENFIELD    Streptococcus species NOT DETECTED NOT DETECTED Final   Streptococcus agalactiae NOT DETECTED NOT DETECTED Final   Streptococcus pneumoniae NOT DETECTED NOT DETECTED Final   Streptococcus pyogenes NOT DETECTED NOT DETECTED Final   Acinetobacter baumannii NOT DETECTED NOT DETECTED Final   Enterobacteriaceae species NOT DETECTED NOT DETECTED Final   Enterobacter cloacae complex NOT DETECTED NOT DETECTED Final   Escherichia coli NOT DETECTED NOT DETECTED Final   Klebsiella oxytoca NOT DETECTED NOT DETECTED Final   Klebsiella pneumoniae NOT DETECTED NOT DETECTED Final   Proteus species NOT DETECTED NOT DETECTED Final   Serratia marcescens NOT DETECTED NOT DETECTED Final   Haemophilus influenzae NOT DETECTED NOT DETECTED Final   Neisseria meningitidis NOT DETECTED NOT DETECTED Final   Pseudomonas aeruginosa NOT DETECTED NOT DETECTED Final   Candida albicans NOT DETECTED NOT DETECTED Final   Candida glabrata NOT DETECTED NOT DETECTED Final   Candida krusei NOT DETECTED NOT DETECTED Final   Candida parapsilosis NOT DETECTED NOT DETECTED Final   Candida tropicalis NOT DETECTED NOT DETECTED Final    Comment: Performed at [facility], [address], [place], [place] [address]  Blood culture (routine x 2)     Status: None (Preliminary result)   Collection Time: 07/21/16  3:50 AM  Result Value Ref Range Status   Specimen Description BLOOD BLOOD RIGHT FOREARM  Final   Special Requests   Final    BOTTLES DRAWN AEROBIC AND ANAEROBIC Blood Culture adequate volume   Culture   Final    NO GROWTH 2 DAYS Performed at [facility], [place] [address], [place], [place] [address]    Report Status PENDING  Incomplete     Labs: Basic Metabolic Panel:  Recent Labs Lab 07/21/16 0212 07/22/16 0055  NA  137 137  K 3.5 3.8  CL 104 104  CO2 24 25  GLUCOSE 154* 148*  BUN 23* 19  CREATININE 1.22* 1.01*  CALCIUM 8.7* 8.5*  MG  --  2.2   Liver Function Tests:  Recent Labs Lab 07/21/16 0212 07/22/16 0055  AST 15 24  ALT 17 19  ALKPHOS 64 59  BILITOT 0.5 0.3  PROT 6.7 6.4*  ALBUMIN 2.9* 2.6*   No results for input(s): LIPASE, AMYLASE in the last 168 hours. No results for input(s): AMMONIA in the last 168 hours. CBC:  Recent Labs Lab 07/21/16 0212 07/22/16 0055 07/23/16 0435  WBC 13.7* 10.5 9.1  NEUTROABS 9.5*  --   --   HGB 12.4 11.5* 10.9*  HCT 38.4 36.0 34.6*  MCV 92.3 94.0 93.5  PLT 178 191 190   Cardiac Enzymes: No results for input(s): CKTOTAL, CKMB, CKMBINDEX, TROPONINI in the last 168 hours. BNP: BNP (last 3 results) No results for input(s): BNP in the last 8760 hours.  ProBNP (last 3 results) No results for input(s): PROBNP in the last 8760  hours.  CBG:  Recent Labs Lab 07/22/16 0742 07/22/16 1142 07/22/16 1659 07/22/16 2137 07/23/16 0740  GLUCAP 148* 229* 153* 130* 133*       Signed:  Nita Sells MD   [facility] 07/23/2016, 10:54 AM

## 2016-07-23 NOTE — Progress Notes (Signed)
Report given to Tye, Therapist, sports at [facility]. [facility] here to transport patient.

## 2016-07-23 NOTE — NC FL2 (Signed)
[place] LEVEL OF CARE SCREENING TOOL     IDENTIFICATION  Patient Name: [name] Birthdate: [birth year] Sex: female Admission Date (Current Location): 07/20/2016  [facility] and [place] Number:  Herbalist and Address:  [facility],  [place] [address] [place]      Provider Number: [id number]  Attending Physician Name and Address:  Nita Sells, MD  Relative Name and Phone Number:       Current Level of Care: Hospital Recommended Level of Care: [place] Prior Approval Number:    Date Approved/Denied:   PASRR Number: [id number]  Discharge Plan: SNF    Current Diagnoses: Patient Active Problem List   Diagnosis Date Noted  . Pulmonary embolism ([place]) 07/21/2016  . DVT (deep venous thrombosis) ([place]) 07/21/2016  . Cough 07/11/2016  . Balance problems 06/14/2016  . Pain of right scapula 06/07/2016  . Acute bronchitis 01/30/2016  . Weight gain 12/20/2015  . Acute pulmonary embolism ([place]) 08/17/2015  . Diabetes mellitus type 2 with complications ([place]) 84/66/5993  . Postoperative anemia due to acute blood loss 02/01/2015  . Arthritis of knee, degenerative 11/15/2014  . Calf pain 01/21/2014  . Insomnia 12/23/2013  . Leg ulcer, left ([place]) 09/13/2013  . Abnormal SPEP 06/04/2013  . Memory loss 04/27/2013  . Panic attacks 03/17/2013  . Right knee pain 12/29/2012  . Rash and nonspecific skin eruption 10/27/2012  . Dizziness and giddiness 09/07/2012  . Pulmonary nodules 07/20/2012  . CAD in native artery 05/28/2012  . Seborrheic dermatitis 01/20/2012  . Recurrent knee pain 09/03/2011  . Knee pain, left 08/03/2011  . Leg pain, left 07/17/2011  . Polydipsia 05/09/2011  . Abscess of axilla, left 08/10/2010  . Obesity 03/07/2010  . INSOMNIA, CHRONIC 03/07/2010  . NAUSEA 03/07/2010  . Avulsion fracture of tooth 03/07/2010  . ALLERGIC RHINITIS 10/26/2009  . GRIEF REACTION 08/01/2009  . DYSPNEA  04/28/2009  . MAXILLARY SINUSITIS 10/05/2008  . Essential hypertension 08/27/2007  . B12 deficiency 08/25/2007  . Vitamin D deficiency 08/25/2007  . ELEVATED BP 07/10/2007  . VISUAL CHANGES 07/02/2007  . UTI 02/07/2007  . ABDOMINAL PAIN RIGHT LOWER QUADRANT 02/07/2007  . SINUSITIS, ACUTE 02/02/2007  . Osteoarthritis 02/02/2007  . Sarcoidosis 08/14/2006  . Anxiety state 08/14/2006  . Major depressive disorder, recurrent episode ([address]) 08/14/2006  . GERD 08/14/2006    Orientation RESPIRATION BLADDER Height & Weight     Self, Time, Situation, Place  O2 (2L) Continent Weight: (!) 329 lb 12.9 oz (149.6 kg) Height:  5\' 7"  (170.2 cm)  BEHAVIORAL SYMPTOMS/MOOD NEUROLOGICAL BOWEL NUTRITION STATUS      Continent Diet (Low sodium heart healthy)  AMBULATORY STATUS COMMUNICATION OF NEEDS Skin   Limited Assist Verbally Normal                       Personal Care Assistance Level of Assistance  Bathing, Feeding, Dressing Bathing Assistance: Limited assistance Feeding assistance: Independent Dressing Assistance: Independent     Functional Limitations Info  Sight, Hearing, Speech Sight Info: Adequate Hearing Info: Adequate Speech Info: Adequate    SPECIAL CARE FACTORS FREQUENCY  PT (By licensed PT), OT (By licensed OT)     PT Frequency: 5x OT Frequency: 5x            Contractures Contractures Info: Not present    Additional Factors Info  Code Status, Allergies Code Status Info: full Allergies Info: Enalapril Maleate, Hydrochlorothiazide, Hydroxychloroquine Sulfate, Latex, Nickel  Current Medications (07/23/2016):  This is the current hospital active medication list Current Facility-Administered Medications  Medication Dose Route Frequency Provider Last Rate Last Dose  . acetaminophen (TYLENOL) tablet 650 mg  650 mg Oral Q6H PRN Lavina Hamman, MD   650 mg at 07/23/16 0056   Or  . acetaminophen (TYLENOL) suppository 650 mg  650 mg Rectal Q6H PRN Lavina Hamman, MD      . albuterol (PROVENTIL) (2.5 MG/3ML) 0.083% nebulizer solution 2.5 mg  2.5 mg Nebulization Q2H PRN Lavina Hamman, MD      . carvedilol (COREG) tablet 6.25 mg  6.25 mg Oral BID WC Lavina Hamman, MD   6.25 mg at 07/23/16 0841  . diclofenac sodium (VOLTAREN) 1 % transdermal gel 2 g  2 g Topical BID Nita Sells, MD   2 g at 07/23/16 0848  . fluocinonide ointment (LIDEX) 0.05 %   Topical BID PRN Nita Sells, MD      . gabapentin (NEURONTIN) capsule 300 mg  300 mg Oral BID Lavina Hamman, MD   300 mg at 07/23/16 0841  . insulin aspart (novoLOG) injection 0-5 Units  0-5 Units Subcutaneous QHS Lavina Hamman, MD   2 Units at 07/21/16 2130  . insulin aspart (novoLOG) injection 0-9 Units  0-9 Units Subcutaneous TID WC Lavina Hamman, MD   1 Units at 07/23/16 [phone]  . methocarbamol (ROBAXIN) tablet 500 mg  500 mg Oral Q8H PRN Lavina Hamman, MD   500 mg at 07/22/16 1257  . nortriptyline (PAMELOR) capsule 75 mg  75 mg Oral QHS Lavina Hamman, MD   75 mg at 07/22/16 2239  . ondansetron (ZOFRAN) tablet 4 mg  4 mg Oral Q6H PRN Lavina Hamman, MD       Or  . ondansetron [facility]) injection 4 mg  4 mg Intravenous Q6H PRN Lavina Hamman, MD      . oxyCODONE-acetaminophen (PERCOCET/ROXICET) 5-325 MG per tablet 1 tablet  1 tablet Oral Q6H PRN Nita Sells, MD      . pantoprazole (PROTONIX) EC tablet 40 mg  40 mg Oral Daily Lavina Hamman, MD   40 mg at 07/23/16 0841  . predniSONE (DELTASONE) tablet 5 mg  5 mg Oral Q breakfast Lavina Hamman, MD   5 mg at 07/23/16 0841  . Rivaroxaban (XARELTO) tablet 15 mg  15 mg Oral BID WC Nanami [place]el[place], Jai-Gurmukh, MD      . sodium chloride flush (NS) 0.9 % injection 3 mL  3 mL Intravenous Q12H Lavina Hamman, MD   3 mL at 07/22/16 2240  . Suvorexant TABS 1 tablet  1 tablet Oral QHS Lavina Hamman, MD      . topiramate (TOPAMAX) tablet 50 mg  50 mg Oral BID Lavina Hamman, MD   50 mg at 07/23/16 0841  . traMADol (ULTRAM) tablet  100 mg  100 mg Oral Q6H PRN Nita Sells, MD      . venlafaxine XR (EFFEXOR-XR) 24 hr capsule 75 mg  75 mg Oral Q breakfast Lavina Hamman, MD   75 mg at 07/23/16 2993     Discharge Medications: Please see discharge summary for a list of discharge medications.  Relevant Imaging Results:  Relevant Lab Results:   Additional Information SS#: [id number]  Nila Nephew, LCSW

## 2016-07-23 NOTE — Progress Notes (Signed)
ANTICOAGULATION CONSULT NOTE - [place] for IV Heparin Indication: DVT/PE  Allergies  Allergen Reactions  . Enalapril Maleate Cough  . Hydrochlorothiazide Other (See Comments)    Reaction:  Low potassium levels   . Hydroxychloroquine Sulfate Other (See Comments)    Reaction:  Vision changes   . Latex Hives  . Nickel Other (See Comments)    Reaction:  Blisters     Patient Measurements: Height: 5\' 7"  (170.2 cm) Weight: (!) 326 lb 8 oz (148.1 kg) IBW/kg (Calculated) : 61.6  Heparin dosing weight: 98 kg  Vital Signs: Temp: 98 F (36.7 C) (07/09 2139) Temp Source: Oral (07/09 2139) BP: 104/63 (07/09 2139) Pulse Rate: 76 (07/09 2139)  Labs:  Recent Labs  07/21/16 0212 07/22/16 0055 07/22/16 1353 07/22/16 2315  HGB 12.4 11.5*  --   --   HCT 38.4 36.0  --   --   PLT 178 191  --   --   LABPROT 13.4 13.9  --   --   INR 1.02 1.07  --   --   HEPARINUNFRC  --  2.12* 1.44* 0.85*  CREATININE 1.22* 1.01*  --   --     Estimated Creatinine Clearance: 95.6 mL/min (A) (by C-G formula based on SCr of 1.01 mg/dL (H)).   Medical History: Past Medical History:  Diagnosis Date  . Allergic rhinitis   . ALLERGIC RHINITIS 10/26/2009  . Anxiety   . ANXIETY 08/14/2006  . B12 DEFICIENCY 08/25/2007  . Confusion   . Depression    takes Lexapro daily  . Diabetes mellitus without complication ([place])    was on insulin but has been off since Nov 2015 and now only takes Metformin daily  . DYSPNEA 04/28/2009   with exertion  . Esophageal reflux    takes Nexium daily  . Fibromyalgia   . Headache    last migraine 2-102yrs ago;takes Topamax daily  . History of shingles   . Hypertension    takes Coreg daily  . Insomnia    takes Nortriptyline nightly   . Joint pain   . Joint swelling   . Left knee pain   . Lichen planus   . Long-term memory loss   . Nocturia   . OSA (obstructive sleep apnea)    doesn't use CPAP;sleep study in epic from 2006  .  Osteoarthritis   . Osteoarthritis   . Pneumonia    over 30 yrs ago  . Protein calorie malnutrition ([place])   . Rheumatoid arthritis ([place])   . Sarcoidosis    Dr. Lolita Patella  . Short-term memory loss   . Shortness of breath   . TIA (transient ischemic attack)   . Unsteady gait   . Urinary urgency   . Vitamin D deficiency    is supposed to take Vit D but can't afford it  . VITAMIN D DEFICIENCY 08/25/2007    Assessment:  56 yr female presented with right leg pain and swelling.  PMH significant for PE and completed a course of Xarelto this past spring (April 2018).    Elevated D-dimer  CTAngio + PE       Dopplers + DVT right lower extremity  Patient received Enoxaparin 145mg  SQ x 1 dose in ED @ 0725 on 7/8  Pharmacy initially consulted to dose Xarelto for DVT/PE, then therapy plan changed and pharmacy consulted to dose IV heparin (Xarelto consult d/c'ed).  Today, 07/23/16:  Heparin level at 1353 = 1.44 units/mL, remains supratherapeutic despite  decrease in heparin infusion rate this AM to 1500 units/hr  CBC: Hgb decreased to 11.5, Pltc WNL  No bleeding or IV site issues reported per nursing  Per RN, heparin level drawn from arm opposite of where heparin infusion running  2315 HL=0.85 above goal no infusion or bleeding issues per RN   Goal of Therapy:  Heparin level 0.3-0.7 units/mL Monitor platelets by anticoagulation protocol: Yes   Plan:   Decrease heparin drip to 1000 units/hr  Check heparin level 6 hours after rate change.   Daily CBC and heparin level.   Monitor closely for s/sx of bleeding.     Dorrene German 07/23/2016 1:04 AM

## 2016-07-24 LAB — CULTURE, BLOOD (ROUTINE X 2): SPECIAL REQUESTS: ADEQUATE

## 2016-07-25 ENCOUNTER — Other Ambulatory Visit: Payer: Self-pay | Admitting: *Deleted

## 2016-07-25 DIAGNOSIS — R5381 Other malaise: Secondary | ICD-10-CM | POA: Diagnosis not present

## 2016-07-25 DIAGNOSIS — I2699 Other pulmonary embolism without acute cor pulmonale: Secondary | ICD-10-CM | POA: Diagnosis not present

## 2016-07-25 DIAGNOSIS — R600 Localized edema: Secondary | ICD-10-CM | POA: Diagnosis not present

## 2016-07-25 DIAGNOSIS — Z7901 Long term (current) use of anticoagulants: Secondary | ICD-10-CM | POA: Diagnosis not present

## 2016-07-25 NOTE — Patient Outreach (Signed)
[facility]) Care Management  07/25/2016  [place] 1960-09-16 [id number]   CSW was able to meet with patient today at [facility] [place] where patient is currently residing to receive short-term rehabilitative services, to perform the initial visit.  Patient endorses that she is at [facility] to try and get stronger, in order to prevent frequent falls in the home.  CSW agreed to speak with patient's physical therapist and occupational therapist, both at [facility], to request that a home safety evaluation be performed with patient in the home, prior to patient being discharged.  Patient voiced understanding and was agreeable to this plan. CSW also agreed to assist patient and discharge planning coordinator at [facility] with arranging home care services and durable medical equipment, prior to patient returning home. Patient was given a list of home health agencies offering home care services, as well as a list of agencies offering durable medical equipment.  Patient was encouraged to review the list and notify CSW of her personal preferences.  CSW will then obtain an order for home care services and equipment and make the appropriate arrangements.  CSW agreed to follow-up with patient in two weeks to assess and assist with any additional discharge planning needs and services. [name], BSW, MSW, LCSW  Licensed Education officer, environmental Health System  Mailing [place] [address] [place], [place] [address] Physical Address-[address], [place], [place] [address] Toll Free Main # [phone] Fax # [phone] Cell # [phone]  Office # [phone] [email or web address]

## 2016-07-26 LAB — CULTURE, BLOOD (ROUTINE X 2)
Culture: NO GROWTH
Special Requests: ADEQUATE

## 2016-07-31 DIAGNOSIS — I82409 Acute embolism and thrombosis of unspecified deep veins of unspecified lower extremity: Secondary | ICD-10-CM | POA: Diagnosis not present

## 2016-07-31 DIAGNOSIS — R600 Localized edema: Secondary | ICD-10-CM | POA: Diagnosis not present

## 2016-08-05 DIAGNOSIS — D869 Sarcoidosis, unspecified: Secondary | ICD-10-CM | POA: Diagnosis not present

## 2016-08-05 DIAGNOSIS — R600 Localized edema: Secondary | ICD-10-CM | POA: Diagnosis not present

## 2016-08-07 DIAGNOSIS — R600 Localized edema: Secondary | ICD-10-CM | POA: Diagnosis not present

## 2016-08-08 ENCOUNTER — Other Ambulatory Visit: Payer: Self-pay | Admitting: Pharmacist

## 2016-08-08 ENCOUNTER — Other Ambulatory Visit: Payer: Self-pay | Admitting: *Deleted

## 2016-08-08 NOTE — Patient Outreach (Signed)
[facility]) Care Management  08/08/2016  [place] [birth year] [id number]   CSW was able to meet with patient today at [facility] [place] where patient currently resides to receive short-term rehabilitative services, but only briefly, as patient reported not feeling well today.  Patient's current plan is still to return home with home health services, at time of release from [facility].  Patient continuing to work with therapies, both physical and occupational.  CSW will continue to follow patient to assess and assist with any additional discharge planning needs and services.  CSW will plan to meet with patient at [facility] for the next routine visit on Thursday, August 9th.  Patient has CSW's contact information and has been encouraged to contact CSW directly if social work needs arise in the meantime. [name], BSW, MSW, LCSW  Licensed Education officer, environmental Health System  Mailing [place] [address], [place], [place] [address] Physical Address-[address], [place], [place] [address] Toll Free Main # [phone] Fax # [phone] Cell # [phone]  Office # [phone] [email or web address]

## 2016-08-08 NOTE — Patient Outreach (Signed)
[facility]) Care Management  08/08/2016  [place] 1960-09-22 [id number]   Called patient today on home and cell phone. No answer. It is not clear if she is still at [facility]. HIPAA compliant message left on home phone.  Patient's cell phone did not have a voicemail set-up.  Plan:  Follow up on the patient again in 21 days and be in touch with [facility] Social Worker, [place], PharmD, [place] Clinical Pharmacist [phone]

## 2016-08-09 ENCOUNTER — Ambulatory Visit (INDEPENDENT_AMBULATORY_CARE_PROVIDER_SITE_OTHER): Payer: Medicare Other | Admitting: Endocrinology

## 2016-08-09 ENCOUNTER — Encounter: Payer: Self-pay | Admitting: Endocrinology

## 2016-08-09 VITALS — BP 100/74 | HR 80 | Ht 67.0 in | Wt 316.0 lb

## 2016-08-09 DIAGNOSIS — E118 Type 2 diabetes mellitus with unspecified complications: Secondary | ICD-10-CM

## 2016-08-09 MED ORDER — SITAGLIPTIN PHOSPHATE 50 MG PO TABS: 50.0000 mg | ORAL_TABLET | Freq: Every day | ORAL | 3 refills | Status: DC

## 2016-08-09 NOTE — Patient Instructions (Addendum)
Please continue the same metformin.   I have sent a prescription to your pharmacy, to add "Tonga."   Please come back for a follow-up appointment in 6 months.   check your blood sugar once a day.  vary the time of day when you check, between before the 3 meals, and at bedtime.  also check if you have symptoms of your blood sugar being too high or too low.  please keep a record of the readings and bring it to your next appointment here.  You can write it on any piece of paper.  please call us sooner if your blood sugar goes below 70, or if you have a lot of readings over 200.

## 2016-08-09 NOTE — Progress Notes (Signed)
Subjective:    Patient ID: [name], female    DOB: [birth year], 56 y.o.   MRN: [id number]  HPI Pt returns for f/u of diabetes mellitus: DM type: 2 Dx'ed: 4259 Complications: polyneuropathy. Therapy: metformin. GDM: never. DKA: never. Severe hypoglycemia: never.  Pancreatitis: never.  Other: she took insulin from 2013-2015; She intermittently takes prednisone, for sarcoidosis; she cannot undergo weight-loss surgery, as she has medicaid.   Interval history: She is now on prednisone 10 mg/d. pt states she feels well in general, except for chronic sob.   Past Medical History:  Diagnosis Date  . Allergic rhinitis   . ALLERGIC RHINITIS 10/26/2009  . Anxiety   . ANXIETY 08/14/2006  . B12 DEFICIENCY 08/25/2007  . Confusion   . Depression    takes Lexapro daily  . Diabetes mellitus without complication ([place])    was on insulin but has been off since Nov 2015 and now only takes Metformin daily  . DYSPNEA 04/28/2009   with exertion  . Esophageal reflux    takes Nexium daily  . Fibromyalgia   . Headache    last migraine 2-79yrs ago;takes Topamax daily  . History of shingles   . Hypertension    takes Coreg daily  . Insomnia    takes Nortriptyline nightly   . Joint pain   . Joint swelling   . Left knee pain   . Lichen planus   . Long-term memory loss   . Nocturia   . OSA (obstructive sleep apnea)    doesn't use CPAP;sleep study in epic from 2006  . Osteoarthritis   . Osteoarthritis   . Pneumonia    over 30 yrs ago  . Protein calorie malnutrition ([place])   . Rheumatoid arthritis ([place])   . Sarcoidosis    Dr. Lolita Patella  . Short-term memory loss   . Shortness of breath   . TIA (transient ischemic attack)   . Unsteady gait   . Urinary urgency   . Vitamin D deficiency    is supposed to take Vit D but can't afford it  . VITAMIN D DEFICIENCY 08/25/2007    Past Surgical History:  Procedure Laterality Date  . APPENDECTOMY    . arthroscopic knee surgery Right  11-12-04  . AXILLARY ABCESS IRRIGATION AND DEBRIDEMENT  Jul & Aug2012  . CARPAL TUNNEL RELEASE Left 05/23/2014   Procedure: CARPAL TUNNEL RELEASE;  Surgeon: Meredith Pel, MD;  Location: [facility];  Service: Orthopedics;  Laterality: Left;  . cyst removed from top of buttocks  at age 38  . ENDOMETRIAL ABLATION    . TOTAL KNEE ARTHROPLASTY Right 11/15/2014   Procedure: TOTAL RIGHT KNEE ARTHROPLASTY;  Surgeon: Meredith Pel, MD;  Location: [facility];  Service: Orthopedics;  Laterality: Right;  . TOTAL KNEE ARTHROPLASTY Left 07/13/2015   Procedure: LEFT TOTAL KNEE ARTHROPLASTY;  Surgeon: Meredith Pel, MD;  Location: [facility];  Service: Orthopedics;  Laterality: Left;    Social History   Social History  . Marital status: Single    Spouse name: N/A  . Number of children: N/A  . Years of education: N/A   Occupational History  . disabled    Social History Main Topics  . Smoking status: Former Smoker    Packs/day: 0.50    Years: 10.00    Types: Cigarettes  . Smokeless tobacco: Never Used     Comment: quit smoking in 2004  . Alcohol use No  . Drug use: No  . Sexual activity:  Not Currently   Other Topics Concern  . Not on file   Social History Narrative   Single, broke up with partner in 2008.    Current Outpatient Prescriptions on File Prior to Visit  Medication Sig Dispense Refill  . Adalimumab (HUMIRA) 40 MG/0.8ML PSKT Inject 40 mg into the skin every 14 (fourteen) days.    [name] albuterol (PROVENTIL HFA;VENTOLIN HFA) 108 (90 Base) MCG/ACT inhaler Inhale 2 puffs into the lungs every 6 (six) hours as needed for wheezing or shortness of breath. 1 Inhaler 3  . carvedilol (COREG) 12.5 MG tablet TAKE 1 TABLET BY MOUTH TWICE DAILY WITH A MEAL 180 tablet 2  . chlorhexidine (PERIDEX) 0.12 % solution Use as directed 5 mLs in the mouth or throat 2 (two) times daily. Pt is to swish and spit.  0  . diclofenac sodium (VOLTAREN) 1 % GEL Apply 2 g topically 2 (two) times daily. (Patient taking  differently: Apply 2 g topically 2 (two) times daily as needed (for pain). ) 1 Tube 1  . esomeprazole (NEXIUM) 40 MG capsule Take 40 mg by mouth daily before breakfast.     . fluocinonide gel (LIDEX) 7.67 % Apply 1 application topically 2 (two) times daily as needed (for sores on gums).     . furosemide (LASIX) 20 MG tablet Take 1 tablet (20 mg total) by mouth daily. (Patient taking differently: Take 20 mg by mouth at bedtime. ) 30 tablet 3  . gabapentin (NEURONTIN) 300 MG capsule Take 1 capsule (300 mg total) by mouth 2 (two) times daily. (Patient taking differently: Take 300 mg by mouth 2 (two) times daily as needed (for pain). ) 60 capsule 5  . KLOR-CON 10 10 MEQ tablet TAKE 1 TABLET BY MOUTH DAILY 90 tablet 3  . lidocaine (XYLOCAINE) 2 % solution Use as directed 20 mLs in the mouth or throat 3 (three) times daily as needed for mouth pain. Pt is to swish and spit.    . metFORMIN (GLUCOPHAGE-XR) 500 MG 24 hr tablet TAKE 2 TABLETS(1000 MG) BY MOUTH DAILY WITH BREAKFAST (Patient taking differently: Take 1,000 mg by mouth daily with breakfast. ) 180 tablet 0  . nortriptyline (PAMELOR) 75 MG capsule Take 1 capsule (75 mg total) by mouth at bedtime. 6 capsule 0  . predniSONE (DELTASONE) 5 MG tablet Take 5 mg by mouth daily with breakfast.    . Rivaroxaban 15 & 20 MG TBPK Take as directed on package: Start with one 15mg  tablet by mouth twice a day with food. On Day 22, switch to one 20mg  tablet once a day with food. 51 each 0  . Suvorexant (BELSOMRA) 20 MG TABS Take 1 tablet by mouth at bedtime. 6 tablet 0  . topiramate (TOPAMAX) 50 MG tablet TAKE 1 TABLET BY MOUTH TWICE DAILY 180 tablet 2  . venlafaxine XR (EFFEXOR XR) 75 MG 24 hr capsule Take 1 capsule (75 mg total) by mouth daily with breakfast. 6 capsule 0  . methocarbamol (ROBAXIN) 500 MG tablet Take 1 tablet (500 mg total) by mouth every 8 (eight) hours as needed for muscle spasms. (Patient not taking: Reported on 08/09/2016) 6 tablet 0  .  oxyCODONE-acetaminophen (PERCOCET/ROXICET) 5-325 MG tablet Take 1 tablet by mouth every 6 (six) hours as needed for moderate pain or severe pain. (Patient not taking: Reported on 08/09/2016) 6 tablet 0  . traMADol (ULTRAM) 50 MG tablet Take 2 tablets (100 mg total) by mouth every 6 (six) hours as needed for moderate pain. (  Patient not taking: Reported on 08/09/2016) 6 tablet 0  . [DISCONTINUED] potassium chloride (K-DUR,KLOR-CON) 10 MEQ tablet TAKE 1 TABLET BY MOUTH DAILY 90 tablet 2   No current facility-administered medications on file prior to visit.     Allergies  Allergen Reactions  . Enalapril Maleate Cough  . Hydrochlorothiazide Other (See Comments)    Reaction:  Low potassium levels   . Hydroxychloroquine Sulfate Other (See Comments)    Reaction:  Vision changes   . Latex Hives  . Nickel Other (See Comments)    Reaction:  Blisters     Family History  Problem Relation Age of Onset  . Heart disease Mother   . Kidney disease Mother   . Cancer Father        leukemia  . Hypertension Unknown   . Coronary artery disease Unknown        female 1st degree relative <60  . Heart failure Unknown        congestive  . Coronary artery disease Unknown        Female 1st degree relative <50  . Breast cancer Unknown        1st degree relative <50 S    BP 100/74   Pulse 80   Ht 5\' 7"  (1.702 m)   Wt (!) 316 lb (143.3 kg)   SpO2 94%   BMI 49.49 kg/m   Review of Systems No change in chronic sob.      Objective:   Physical Exam VITAL SIGNS:  See vs page GENERAL: no distress.  In wheelchair.  Has 02 on.   Pulses: foot pulses are intact bilaterally.   MSK: no deformity of the feet or ankles.  CV: 2+ right leg edema  1+ on the left. Skin:  no ulcer on the feet or ankles.  Normal temp on the feet and ankles. Slight right leg cyanosis. Neuro: sensation is intact to touch on the feet and ankles, but slightly decreased from normal.   Lab Results  Component Value Date   HGBA1C 7.1 (H)  07/22/2016      Assessment & Plan:  Type 2 DM, with polyneuropathy: worse.   Edema: this limits rx options.    Patient Instructions  Please continue the same metformin.   I have sent a prescription to your pharmacy, to add "Tonga."   Please come back for a follow-up appointment in 6 months.   check your blood sugar once a day.  vary the time of day when you check, between before the 3 meals, and at bedtime.  also check if you have symptoms of your blood sugar being too high or too low.  please keep a record of the readings and bring it to your next appointment here.  You can write it on any piece of paper.  please call us sooner if your blood sugar goes below 70, or if you have a lot of readings over 200.

## 2016-08-12 DIAGNOSIS — D869 Sarcoidosis, unspecified: Secondary | ICD-10-CM | POA: Diagnosis not present

## 2016-08-15 ENCOUNTER — Telehealth: Payer: Self-pay | Admitting: Internal Medicine

## 2016-08-15 NOTE — Telephone Encounter (Signed)
OK to order PT/OT now Thx

## 2016-08-15 NOTE — Telephone Encounter (Signed)
Patient was just discharged yesterday from [facility].  Patient has not had a hospital fu or discharge fu.   Requesting orders for PT and OT eval and treat.  Does patient need to come in first for appt before these orders are done?

## 2016-08-16 ENCOUNTER — Telehealth: Payer: Self-pay | Admitting: Internal Medicine

## 2016-08-16 NOTE — Telephone Encounter (Signed)
[name] [id number]  [facility] at home   Physical therapy  will be going out to home to start pt with patient on Monday the 6th   Fyi

## 2016-08-16 NOTE — Telephone Encounter (Signed)
Notified [name] was out of office) gave MD response...[name]

## 2016-08-16 NOTE — Telephone Encounter (Signed)
FYI

## 2016-08-19 DIAGNOSIS — M797 Fibromyalgia: Secondary | ICD-10-CM | POA: Diagnosis not present

## 2016-08-19 DIAGNOSIS — D869 Sarcoidosis, unspecified: Secondary | ICD-10-CM | POA: Diagnosis not present

## 2016-08-19 DIAGNOSIS — E1142 Type 2 diabetes mellitus with diabetic polyneuropathy: Secondary | ICD-10-CM | POA: Diagnosis not present

## 2016-08-19 DIAGNOSIS — M069 Rheumatoid arthritis, unspecified: Secondary | ICD-10-CM | POA: Diagnosis not present

## 2016-08-19 DIAGNOSIS — I2782 Chronic pulmonary embolism: Secondary | ICD-10-CM | POA: Diagnosis not present

## 2016-08-19 DIAGNOSIS — I82421 Acute embolism and thrombosis of right iliac vein: Secondary | ICD-10-CM | POA: Diagnosis not present

## 2016-08-21 DIAGNOSIS — E1142 Type 2 diabetes mellitus with diabetic polyneuropathy: Secondary | ICD-10-CM | POA: Diagnosis not present

## 2016-08-21 DIAGNOSIS — I2782 Chronic pulmonary embolism: Secondary | ICD-10-CM | POA: Diagnosis not present

## 2016-08-21 DIAGNOSIS — I82421 Acute embolism and thrombosis of right iliac vein: Secondary | ICD-10-CM | POA: Diagnosis not present

## 2016-08-21 DIAGNOSIS — M069 Rheumatoid arthritis, unspecified: Secondary | ICD-10-CM | POA: Diagnosis not present

## 2016-08-21 DIAGNOSIS — M797 Fibromyalgia: Secondary | ICD-10-CM | POA: Diagnosis not present

## 2016-08-21 DIAGNOSIS — D869 Sarcoidosis, unspecified: Secondary | ICD-10-CM | POA: Diagnosis not present

## 2016-08-22 ENCOUNTER — Other Ambulatory Visit: Payer: Self-pay | Admitting: *Deleted

## 2016-08-22 ENCOUNTER — Other Ambulatory Visit: Payer: Self-pay

## 2016-08-22 DIAGNOSIS — E1142 Type 2 diabetes mellitus with diabetic polyneuropathy: Secondary | ICD-10-CM | POA: Diagnosis not present

## 2016-08-22 DIAGNOSIS — M069 Rheumatoid arthritis, unspecified: Secondary | ICD-10-CM | POA: Diagnosis not present

## 2016-08-22 DIAGNOSIS — D869 Sarcoidosis, unspecified: Secondary | ICD-10-CM | POA: Diagnosis not present

## 2016-08-22 DIAGNOSIS — I82421 Acute embolism and thrombosis of right iliac vein: Secondary | ICD-10-CM | POA: Diagnosis not present

## 2016-08-22 DIAGNOSIS — I2782 Chronic pulmonary embolism: Secondary | ICD-10-CM | POA: Diagnosis not present

## 2016-08-22 DIAGNOSIS — M797 Fibromyalgia: Secondary | ICD-10-CM | POA: Diagnosis not present

## 2016-08-22 NOTE — Patient Outreach (Incomplete)
[facility]) Care Management  08/22/16  [place] [birth year] [id number]   RNCM received referral from [facility] SW for transition of care follow up. Patient was hospitalized 7/7-7/10 for PE and was discharged to [facility] for SNF level of care. Patient is now at home.  Successful outreach completed with patient. Patient identification verified.RNCM provided education about [facility] Care management and role of [facility] RNCM and patient was agreeable to outreach.  Patient stated that she is feeling much better, but stated that she thought she was being discharged home on oxygen. She stated that she did cone home

## 2016-08-22 NOTE — Patient Outreach (Signed)
[facility]) Care Management  08/22/2016  [name] [facility] 01/29/1960 [id number]   CSW was able to make contact with patient today to follow-up regarding patient's recent discharge from [facility] [place] where patient was residing to receive short-term rehabilitative services.  Fortunately, CSW was able to get in contact with patient via her cell phone, to learn that she had been discharged from [facility] on August 1st, before driving to the facility for a routine visit. Patient admitted that she is home and doing well with therapies, both physical and occupational.  Patient further admitted that she is "getting around" a lot better, now that she has regained strength and mobility.   Patient has a follow-up appointment with her Primary Care Physician, Dr. [name] Plotnikov scheduled for Friday, August 10th, at which time, patient plans to talk with Dr. Alain Marion about getting on continuous oxygen in the home.  Patient admits that she is currently confined to her home because she does not own a portable tank.  CSW explained to patient that Dr. Alain Marion can write the order then have her nurse make the referral for the durable medical equipment.  Patient voiced understanding and was agreeable to this plan.  Patient has been given CSW's contact information and encouraged to contact CSW directly if any additional social work needs arise in the near future.  CSW has notified patient's Pharmacist, [name], with [place] Management of patient's recent discharge so that she can begin following patient again.  CSW has also placed an order for patient to be consulted by an [facility] for disease management services. CSW will perform a case closure on patient, as all goals of treatment have been met from social work standpoint and no additional social work needs have been identified at this time.  CSW will fax an update to  patient's Primary Care Physician, Dr. [name] Plotnikov to ensure that they are aware of CSW's involvement with patient's plan of care.   [name], BSW, MSW, LCSW  Licensed Education officer, environmental Health System  Mailing [place] [address] [place], [place] [address] Physical Address-[address], [place], [place] [address] Toll Free Main # [phone] Fax # [phone] Cell # [phone]  Office # [phone] [name]

## 2016-08-23 ENCOUNTER — Other Ambulatory Visit (INDEPENDENT_AMBULATORY_CARE_PROVIDER_SITE_OTHER): Payer: Medicare Other

## 2016-08-23 ENCOUNTER — Encounter: Payer: Self-pay | Admitting: Internal Medicine

## 2016-08-23 ENCOUNTER — Ambulatory Visit (INDEPENDENT_AMBULATORY_CARE_PROVIDER_SITE_OTHER): Payer: Medicare Other | Admitting: Internal Medicine

## 2016-08-23 DIAGNOSIS — I2699 Other pulmonary embolism without acute cor pulmonale: Secondary | ICD-10-CM

## 2016-08-23 DIAGNOSIS — I825Y1 Chronic embolism and thrombosis of unspecified deep veins of right proximal lower extremity: Secondary | ICD-10-CM

## 2016-08-23 DIAGNOSIS — E118 Type 2 diabetes mellitus with unspecified complications: Secondary | ICD-10-CM

## 2016-08-23 DIAGNOSIS — R0902 Hypoxemia: Secondary | ICD-10-CM | POA: Diagnosis not present

## 2016-08-23 DIAGNOSIS — D869 Sarcoidosis, unspecified: Secondary | ICD-10-CM

## 2016-08-23 DIAGNOSIS — J9611 Chronic respiratory failure with hypoxia: Secondary | ICD-10-CM | POA: Insufficient documentation

## 2016-08-23 DIAGNOSIS — E538 Deficiency of other specified B group vitamins: Secondary | ICD-10-CM | POA: Diagnosis not present

## 2016-08-23 DIAGNOSIS — I10 Essential (primary) hypertension: Secondary | ICD-10-CM | POA: Diagnosis not present

## 2016-08-23 DIAGNOSIS — E559 Vitamin D deficiency, unspecified: Secondary | ICD-10-CM | POA: Diagnosis not present

## 2016-08-23 LAB — CBC WITH DIFFERENTIAL/PLATELET
BASOS ABS: 0.1 10*3/uL (ref 0.0–0.1)
Basophils Relative: 0.9 % (ref 0.0–3.0)
EOS ABS: 0.2 10*3/uL (ref 0.0–0.7)
Eosinophils Relative: 1.9 % (ref 0.0–5.0)
HCT: 38 % (ref 36.0–46.0)
Hemoglobin: 12.1 g/dL (ref 12.0–15.0)
LYMPHS ABS: 1.9 10*3/uL (ref 0.7–4.0)
Lymphocytes Relative: 19.2 % (ref 12.0–46.0)
MCHC: 31.7 g/dL (ref 30.0–36.0)
MCV: 91.1 fl (ref 78.0–100.0)
MONO ABS: 0.9 10*3/uL (ref 0.1–1.0)
MONOS PCT: 9.3 % (ref 3.0–12.0)
NEUTROS PCT: 68.7 % (ref 43.0–77.0)
Neutro Abs: 6.9 10*3/uL (ref 1.4–7.7)
PLATELETS: 246 10*3/uL (ref 150.0–400.0)
RBC: 4.17 Mil/uL (ref 3.87–5.11)
RDW: 14.9 % (ref 11.5–15.5)
WBC: 10.1 10*3/uL (ref 4.0–10.5)

## 2016-08-23 LAB — BASIC METABOLIC PANEL
BUN: 17 mg/dL (ref 6–23)
CALCIUM: 9.7 mg/dL (ref 8.4–10.5)
CO2: 30 mEq/L (ref 19–32)
CREATININE: 1.16 mg/dL (ref 0.40–1.20)
Chloride: 99 mEq/L (ref 96–112)
GFR: 51.35 mL/min — AB (ref >60.00)
GLUCOSE: 198 mg/dL — AB (ref 70–99)
Potassium: 3.5 mEq/L (ref 3.5–5.1)
SODIUM: 137 meq/L (ref 135–145)

## 2016-08-23 LAB — HEPATIC FUNCTION PANEL
ALK PHOS: 84 U/L (ref 39–117)
ALT: 15 U/L (ref 0–35)
AST: 16 U/L (ref 0–37)
Albumin: 3.9 g/dL (ref 3.5–5.2)
BILIRUBIN DIRECT: 0 mg/dL (ref 0.0–0.3)
BILIRUBIN TOTAL: 0.4 mg/dL (ref 0.2–1.2)
Total Protein: 7.8 g/dL (ref 6.0–8.3)

## 2016-08-23 LAB — TSH: TSH: 2.31 u[IU]/mL (ref 0.35–4.50)

## 2016-08-23 MED ORDER — CYANOCOBALAMIN 1000 MCG/ML IJ SOLN
1000.0000 ug | Freq: Once | INTRAMUSCULAR | Status: AC
  Administered 2016-08-23: 1000 ug via INTRAMUSCULAR

## 2016-08-23 MED ORDER — POTASSIUM CHLORIDE ER 10 MEQ PO TBCR: 10.0000 meq | EXTENDED_RELEASE_TABLET | Freq: Every day | ORAL | 3 refills | Status: DC

## 2016-08-23 NOTE — Addendum Note (Signed)
Addended by: Karren Cobble on: 08/23/2016 11:56 AM   Modules accepted: Orders

## 2016-08-23 NOTE — Assessment & Plan Note (Signed)
RLE - hosp d/c reviewed Xarelto

## 2016-08-23 NOTE — Assessment & Plan Note (Signed)
On O2 

## 2016-08-23 NOTE — Progress Notes (Signed)
Subjective:  Patient ID: [name], female    DOB: 12-Feb-1960  Age: 56 y.o. MRN: [id number]  CC: No chief complaint on file.   HPI [name] presents for RLE DVT/PE, sarcoidosis, HTN, DM f/u  D/c'd home from [facility] on O2  Per d/c:  "Right lower extremity occlusive DVT Appreciate vascular input-I called him personally 7/9 Dr. Oneida Alar who concurs that risk outweigh the benefits for lytics Patient at high risk for post-thrombotic syndrome Compression stockings Per ACC and chest guidelines technically can attempt to transition to oral-however intensity of swelling from my perspective necessitating IV heparin at least until 07/23/2016 Needs lifelong anticoagulation Up with therapy--->SNF Pain control with tramadol 50-->100 every 6 scheduled today, added percocet on d/c Not infectious from my perspective, discontinue Keflex started on admission  Pulmonary/cutaneous sarcoid On Humira-cc, neurologist Dr. Elsworth Soho Continue low-dose prednisone 5 mg every morning Continue Lidex 1 application twice a day for sores and gums             [facility]  Bipolar Continue Effexor 75 every morning Continue Pamelor 75 at bedtime ContinueSuvrexant 1 mg at bedtime  Diabetes mellitus2complication neuropathy Holding PTA metformin 1000 a.m. Placed on sliding scale coverage insulin-trends well controlled this admit  HTN Continue Coreg 6.25 ii/day, holding PTA Lasix             Resumed on d/c  Headaches Continue Topamax 1 tablet twice a day  Osteoarthritis Continue gabapentin as above in addition to voltaren gl"   Outpatient Medications Prior to Visit  Medication Sig Dispense Refill  .  Adalimumab (HUMIRA) 40 MG/0.8ML PSKT Inject 40 mg into the skin every 14 (fourteen) days.    [name] albuterol (PROVENTIL HFA;VENTOLIN HFA) 108 (90 Base) MCG/ACT inhaler Inhale 2 puffs into the lungs every 6 (six) hours as needed for wheezing or shortness of breath. 1 Inhaler 3  . carvedilol (COREG) 12.5 MG tablet TAKE 1 TABLET BY MOUTH TWICE DAILY WITH A MEAL 180 tablet 2  . chlorhexidine (PERIDEX) 0.12 % solution Use as directed 5 mLs in the mouth or throat 2 (two) times daily. Pt is to swish and spit.  0  . diclofenac sodium (VOLTAREN) 1 % GEL Apply 2 g topically 2 (two) times daily. (Patient taking differently: Apply 2 g topically 2 (two) times daily as needed (for pain). ) 1 Tube 1  . esomeprazole (NEXIUM) 40 MG capsule Take 40 mg by mouth daily before breakfast.     . fluocinonide gel (LIDEX) 8.65 % Apply 1 application topically 2 (two) times daily as needed (for sores on gums).     . gabapentin (NEURONTIN) 300 MG capsule Take 1 capsule (300 mg total) by mouth 2 (two) times daily. (Patient taking differently: Take 300 mg by mouth 2 (two) times daily as needed (for pain). ) 60 capsule 5  . KLOR-CON 10 10 MEQ tablet TAKE 1 TABLET BY MOUTH DAILY 90 tablet 3  . lidocaine (XYLOCAINE) 2 % solution Use as directed 20 mLs in the mouth or throat 3 (three) times daily as needed for mouth pain. Pt is to swish and spit.    . metFORMIN (GLUCOPHAGE-XR) 500 MG 24 hr tablet TAKE 2 TABLETS(1000 MG) BY MOUTH DAILY WITH BREAKFAST (Patient taking differently: Take 1,000 mg by mouth daily with breakfast. ) 180 tablet 0  . methocarbamol (ROBAXIN) 500 MG tablet Take 1 tablet (500 mg total) by mouth every 8 (eight) hours as needed for muscle spasms.  6 tablet 0  . nortriptyline (PAMELOR) 75 MG capsule Take 1 capsule (75 mg total) by mouth at bedtime. 6 capsule 0  . predniSONE (DELTASONE) 5 MG tablet Take 5 mg by mouth daily with breakfast.    . Rivaroxaban 15 & 20 MG TBPK Take as directed on package: Start with one 15mg  tablet  by mouth twice a day with food. On Day 22, switch to one 20mg  tablet once a day with food. 51 each 0  . sitaGLIPtin (JANUVIA) 50 MG tablet Take 1 tablet (50 mg total) by mouth daily. 90 tablet 3  . Suvorexant (BELSOMRA) 20 MG TABS Take 1 tablet by mouth at bedtime. 6 tablet 0  . topiramate (TOPAMAX) 50 MG tablet TAKE 1 TABLET BY MOUTH TWICE DAILY 180 tablet 2  . traMADol (ULTRAM) 50 MG tablet Take 2 tablets (100 mg total) by mouth every 6 (six) hours as needed for moderate pain. 6 tablet 0  . venlafaxine XR (EFFEXOR XR) 75 MG 24 hr capsule Take 1 capsule (75 mg total) by mouth daily with breakfast. 6 capsule 0  . furosemide (LASIX) 20 MG tablet Take 1 tablet (20 mg total) by mouth daily. (Patient taking differently: Take 20 mg by mouth at bedtime. ) 30 tablet 3  . oxyCODONE-acetaminophen (PERCOCET/ROXICET) 5-325 MG tablet Take 1 tablet by mouth every 6 (six) hours as needed for moderate pain or severe pain. (Patient not taking: Reported on 08/23/2016) 6 tablet 0   No facility-administered medications prior to visit.     ROS Review of Systems  Constitutional: Positive for fatigue and unexpected weight change. Negative for activity change, appetite change and chills.  HENT: Negative for congestion, mouth sores and sinus pressure.   Eyes: Negative for visual disturbance.  Respiratory: Positive for shortness of breath. Negative for cough and chest tightness.   Gastrointestinal: Negative for abdominal pain and nausea.  Genitourinary: Negative for difficulty urinating, frequency and vaginal pain.  Musculoskeletal: Positive for arthralgias, back pain and gait problem.  Skin: Negative for pallor and rash.  Neurological: Negative for dizziness, tremors, weakness, numbness and headaches.  Psychiatric/Behavioral: Positive for decreased concentration and dysphoric mood. Negative for confusion, sleep disturbance and suicidal ideas.    Objective:  BP 110/74 (BP Location: Right Arm, Patient Position:  Sitting, Cuff Size: Large)   Pulse 100   Temp 98.7 F (37.1 C) (Oral)   Ht 5\' 7"  (1.702 m)   Wt (!) 317 lb (143.8 kg)   SpO2 92%   BMI 49.65 kg/m   BP Readings from Last 3 Encounters:  08/23/16 110/74  08/09/16 100/74  07/23/16 (!) 116/51    Wt Readings from Last 3 Encounters:  08/23/16 (!) 317 lb (143.8 kg)  08/09/16 (!) 316 lb (143.3 kg)  07/22/16 (!) 329 lb 12.9 oz (149.6 kg)    Physical Exam  Constitutional: She appears well-developed. No distress.  HENT:  Head: Normocephalic.  Right Ear: External ear normal.  Left Ear: External ear normal.  Nose: Nose normal.  Mouth/Throat: Oropharynx is clear and moist.  Eyes: Pupils are equal, round, and reactive to light. Conjunctivae are normal. Right eye exhibits no discharge. Left eye exhibits no discharge.  Neck: Normal range of motion. Neck supple. No JVD present. No tracheal deviation present. No thyromegaly present.  Cardiovascular: Normal rate, regular rhythm and normal heart sounds.   Pulmonary/Chest: No stridor. No respiratory distress. She has no wheezes.  Abdominal: Soft. Bowel sounds are normal. She exhibits no distension and no mass. There is no  tenderness. There is no rebound and no guarding.  Musculoskeletal: She exhibits edema and tenderness.  Lymphadenopathy:    She has no cervical adenopathy.  Neurological: She displays normal reflexes. No cranial nerve deficit. She exhibits normal muscle tone. Coordination abnormal.  Skin: No rash noted. No erythema.  Psychiatric: She has a normal mood and affect. Her behavior is normal. Judgment and thought content normal.  RLE 1+ edema Obese Walker   A complex case   I spent total of 45 minutes face-to-face with patient and greater than 50% was spent counseling and or coordinating care - we discussed her DVT/PE; home O2 the need of a good DM cotrol and its effects. Ref to [facility] made for home O2.   Lab Results  Component Value Date   WBC 9.1 07/23/2016     HGB 10.9 (L) 07/23/2016   HCT 34.6 (L) 07/23/2016   PLT 190 07/23/2016   GLUCOSE 148 (H) 07/22/2016   CHOL 193 06/29/2008   TRIG 145.0 06/29/2008   HDL 67.50 06/29/2008   LDLCALC 97 06/29/2008   ALT 19 07/22/2016   AST 24 07/22/2016   NA 137 07/22/2016   K 3.8 07/22/2016   CL 104 07/22/2016   CREATININE 1.01 (H) 07/22/2016   BUN 19 07/22/2016   CO2 25 07/22/2016   TSH 1.77 01/30/2016   INR 1.07 07/22/2016   HGBA1C 7.1 (H) 07/22/2016   MICROALBUR <0.7 02/01/2015    Ct Angio Chest Pe W And/or Wo Contrast  Result Date: 07/21/2016 CLINICAL DATA:  Acute onset of right hip pain, extending to the foot, with soft tissue swelling. Elevated D-dimer. Current history of sarcoidosis. Initial encounter. EXAM: CT ANGIOGRAPHY CHEST WITH CONTRAST TECHNIQUE: Multidetector CT imaging of the chest was performed using the standard protocol during bolus administration of intravenous contrast. Multiplanar CT image reconstructions and MIPs were obtained to evaluate the vascular anatomy. CONTRAST:  100 mL of Isovue 370 IV contrast COMPARISON:  Chest radiograph performed 07/11/2016, and CT of the chest performed 02/02/2013 FINDINGS: Cardiovascular: There is a small chronic appearing segmental pulmonary embolus within a pulmonary artery branch to the right lower lobe. There is apparent mild stricturing of pulmonary arterial and venous structures at the hila bilaterally, likely corresponding to the patient's sarcoidosis. Emptying of the pulmonary veins is difficult to fully assess at the hila. Diffuse coronary artery calcifications are seen. The heart remains normal in size. Mild calcification is noted along the aortic arch. The great vessels are unremarkable in appearance. Mediastinum/Nodes: Numerous calcified mediastinal and hilar nodes are noted, measuring up to 1.7 cm in short axis. No pericardial effusion is identified. The visualized portions of the thyroid gland are unremarkable. No axillary lymphadenopathy is  seen. Lungs/Pleura: Scattered bilateral pulmonary nodules and mild posterior right upper lobe scarring is noted, likely corresponding to the patient's sarcoidosis. No pleural effusion or pneumothorax is seen. Evaluation for mass is limited given the patient's underlying sarcoidosis. Upper Abdomen: The visualized portions of the liver and spleen are grossly unremarkable. The visualized portions of the gallbladder, pancreas, adrenal glands and kidneys are within normal limits. Musculoskeletal: No acute osseous abnormalities are identified. The visualized musculature is unremarkable in appearance. Review of the MIP images confirms the above findings. IMPRESSION: 1. Small chronic appearing segmental pulmonary embolus within a pulmonary artery branch to the right lower lung lobe. 2. Apparent mild stricturing of pulmonary arterial and venous structures at the hila bilaterally, likely corresponding to the patient's sarcoidosis. Diffuse surrounding chronic calcified mediastinal and hilar lymphadenopathy.  3. Scattered bilateral pulmonary nodules and mild posterior right upper lobe scarring, likely reflecting the patient's sarcoidosis. 4. Diffuse coronary artery calcifications noted. These results were called by telephone at the time of interpretation on 07/21/2016 at 5:37 am to Dr. Duffy Bruce, who verbally acknowledged these results. Electronically Signed   By: Garald Balding M.D.   On: 07/21/2016 05:39    Assessment & Plan:   There are no diagnoses linked to this encounter. I have discontinued Ms. Bearman's oxyCODONE-acetaminophen. I am also having her maintain her chlorhexidine, albuterol, carvedilol, topiramate, KLOR-CON 10, gabapentin, diclofenac sodium, fluocinonide gel, esomeprazole, lidocaine, metFORMIN, predniSONE, Adalimumab, traMADol, nortriptyline, venlafaxine XR, Suvorexant, methocarbamol, Rivaroxaban, sitaGLIPtin, furosemide, and XARELTO.  Meds ordered this encounter  Medications  . furosemide  (LASIX) 40 MG tablet    Sig: TAKE 1 TABLET BY MOUTH EVERY MORNING FOR EDEMA    Refill:  0  . XARELTO 20 MG TABS tablet    Sig: Take 20 mg by mouth daily. with food    Refill:  0     Follow-up: No Follow-up on file.  Walker Kehr, MD

## 2016-08-23 NOTE — Assessment & Plan Note (Signed)
On Xarelto 

## 2016-08-23 NOTE — Assessment & Plan Note (Signed)
O2 2 l/min [place]

## 2016-08-23 NOTE — Assessment & Plan Note (Signed)
Coreg, Lasix 

## 2016-08-23 NOTE — Assessment & Plan Note (Signed)
B12 inj 

## 2016-08-23 NOTE — Assessment & Plan Note (Signed)
On Vit D 

## 2016-08-23 NOTE — Assessment & Plan Note (Signed)
Metformin, Januvia 

## 2016-08-26 ENCOUNTER — Telehealth: Payer: Self-pay | Admitting: Internal Medicine

## 2016-08-26 ENCOUNTER — Ambulatory Visit
Admission: RE | Admit: 2016-08-26 | Discharge: 2016-08-26 | Admitting: Student in an Organized Health Care Education/Training Program

## 2016-08-26 DIAGNOSIS — Z79899 Other long term (current) drug therapy: Secondary | ICD-10-CM | POA: Diagnosis not present

## 2016-08-26 DIAGNOSIS — D8689 Sarcoidosis of other sites: Secondary | ICD-10-CM | POA: Diagnosis not present

## 2016-08-26 DIAGNOSIS — D869 Sarcoidosis, unspecified: Principal | ICD-10-CM

## 2016-08-26 MED ORDER — ADALIMUMAB 40 MG/0.8 ML SUBCUTANEOUS PEN KIT: each | 11 refills | 0 days | Status: AC

## 2016-08-26 MED ORDER — ADALIMUMAB 40 MG/0.8 ML SUBCUTANEOUS PEN KIT
SUBCUTANEOUS | 11 refills | 0.00000 days | Status: CP
Start: 2016-08-26 — End: 2016-08-26

## 2016-08-26 NOTE — Unmapped (Signed)
ASSESSMENT/PLAN:  Sarcoidosis, cutaneous and pulmonary   1. Dr. Vassie Loll (pulmonary) on board with plan, increase adalimumab to 40mg  weekly (started eow 06/2016)  2. 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  3. Consider future ophtho exam to screen for ocular sarcoid.  4.  After the patient was informed of risks, benefits and side effects of intralesional steroid injection, the patient elected to undergo injection. Informed verbal consent was obtained. Risk of atrophy  and dyspigmentation with injection was explained. Kenalog 10 mg/ml was injected locally into the sites located on L arm, face, and R arm, in a clean fashion following alcohol prep.   Total volume in ml=1.  Number of sites treated: >7   Wound care was explained to the patient   5. Decease prednisone as tolerated    RTC: 8 weeks    SUBJECTIVE:  CC:  Carolyn Newton is a 56 y.o. female  who presents for follow-up of hidradenitis suppurativa.  She started adalimumab (80mg  loading, 40mg  eow maintenance) in 06/2016.  ILK was helpful at last couple of visits for the scalp and arm and reduced her symptoms. She developed a DVT following her last visit and has been in the hospital and a SNF.  She is on 5mg  of prednisone daily and hoping to stop because it effects her mood and temper.  She says there is also concern that it she could have some neurologic involvement, which is being worked up.  Also has history of lichen planus.    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  Many years on and off of prednisone with temporary improvement, but overall thinks she is worse over time.  Has had a large amount of weight gain in the last year since restarting prednisone in April 2016.  Had come off recently, but now on Medrol dose pack from her PCP.  Was on methotrexate given worsening cutaneous involvement of the scalp.  Dose was 10mg  taken 2 doses 12 hours apart in winter 2017-18. Did that for 3 months, but stopped because she thought it wasn't helping.  Had been on hydroxychloroquine many years ago, but it was not very helpful.  Has been on doxycycline many times over the years without much improvement.     Followed by Dr. Vassie Loll for pulmonary sarcoidosis and we have discussed the treatment plan.    ROS: The balance of 10 systems is negative unless otherwise documented..      OBJECTIVE:     Gen: Well appearing, well nourished female in no acute distress. Alert and oriented x 3  Exam: Exam of the scalp, face, eyelids, lips, neck, chest, abdomen, back, bilateral upper and lower extremities, palms, soles, and nails was performed and notable for:  1. Alopecic scaly, erythematuos patches on the frontal and occipital scalp, similar plaques on the L upper arm, L thigh, concha bilaterally, and nasolabial folds.  Slight-moderate interval improvement.  -sites not commented on demonstrate normal findings.

## 2016-08-26 NOTE — Unmapped (Addendum)
Sarcoidosis: Care Instructions  Your Care Instructions    Sarcoidosis (say sar-koy-DOH-sus) is a rare disease that causes tiny lumps of cells throughout the body called granulomas. These lumps are too small to see or feel. They can form anywhere on the inside or outside of the body and can cause permanent scar tissue. They often form in the lungs, lymph nodes, liver, skin, or eyes. Sarcoidosis may affect how an organ works. For instance, if it is in the lungs, you may be short of breath.  For most people, sarcoidosis is a long-term disease that lasts several years or a lifetime. But some cases go away in a few months. Experts have no way of knowing how it will affect you. For some people, the disease may cause no symptoms at all. For others, symptoms may include fever, body aches, swollen lymph glands, shortness of breath, painful joints, and numbness. It may lead to lung or heart problems. Sometimes sarcoidosis can cause high calcium levels in the blood.  Sarcoidosis occurs most often in young and middle-aged adults. Although the cause is not known, the disease does not spread from person to person.  Different types of sarcoidosis have different treatments. Sarcoidosis may require long-term treatment (lasting months to years) with corticosteroids and other medicines, especially if it causes symptoms. You may also need regular tests.  Follow-up care is a key part of your treatment and safety. Be sure to make and go to all appointments, and call your doctor if you are having problems. It's also a good idea to know your test results and keep a list of the medicines you take.  How can you care for yourself at home?  ?? Take your medicines exactly as prescribed. Call your doctor if you think you are having a problem with your medicine.  ?? Do not smoke. Smoking can make sarcoidosis worse. If you need help quitting, talk to your doctor about stop-smoking programs and medicines. These can increase your chances of quitting for good.  ?? Avoid dust, smoke, and fumes. They can harm your lungs.  ?? Drink plenty of fluids, enough so that your urine is light yellow or clear like water. If you have kidney, heart, or liver disease and have to limit fluids, talk with your doctor before you increase the amount of fluids you drink.  ?? If your doctor recommends it, get more exercise. Walking is a good choice. Bit by bit, increase the amount you walk every day. Try for at least 30 minutes on most days of the week. You also may want to swim, bike, or do other activities.  When should you call for help?  Call 911 anytime you think you may need emergency care. For example, call if:  ?? ?? You have severe trouble breathing.   ?? ?? You passed out (lost consciousness).   ??Call your doctor now or seek immediate medical care if:  ?? ?? You have changes in your vision.   ?? ?? You are very tired, get confused, or urinate a lot.   ?? ?? Your symptoms do not get better, or they get worse.   ??Watch closely for changes in your health, and be sure to contact your doctor if you have any problems.  Where can you learn more?  Go to [facility] at [email or web address]  Select Preferences in the upper right hand corner, then select Health Library under Resources. Enter H756 in the search box to learn more about Sarcoidosis: Care Instructions.  Current as of: December 20, 2015  Content Version: 11.7  ?? 2006-2018 [employer]. Care instructions adapted under license by [facility]. If you have questions about a medical condition or this instruction, always ask your healthcare professional. [employer] disclaims any warranty or liability for your use of this information.

## 2016-08-26 NOTE — Telephone Encounter (Signed)
Pt called in and said that she rec oxygen at the rehab center, she was here to see [facility] on 8/10 .  She has run out of oxygen she doesn't have any left.  She thought that [facility] was faxing over a order to get oxygen?

## 2016-08-27 ENCOUNTER — Other Ambulatory Visit: Payer: Self-pay

## 2016-08-27 ENCOUNTER — Other Ambulatory Visit: Payer: Self-pay | Admitting: Internal Medicine

## 2016-08-27 DIAGNOSIS — E1142 Type 2 diabetes mellitus with diabetic polyneuropathy: Secondary | ICD-10-CM | POA: Diagnosis not present

## 2016-08-27 DIAGNOSIS — M069 Rheumatoid arthritis, unspecified: Secondary | ICD-10-CM | POA: Diagnosis not present

## 2016-08-27 DIAGNOSIS — D869 Sarcoidosis, unspecified: Secondary | ICD-10-CM | POA: Diagnosis not present

## 2016-08-27 DIAGNOSIS — M797 Fibromyalgia: Secondary | ICD-10-CM | POA: Diagnosis not present

## 2016-08-27 DIAGNOSIS — I2782 Chronic pulmonary embolism: Secondary | ICD-10-CM | POA: Diagnosis not present

## 2016-08-27 DIAGNOSIS — I82421 Acute embolism and thrombosis of right iliac vein: Secondary | ICD-10-CM | POA: Diagnosis not present

## 2016-08-27 NOTE — Patient Outreach (Signed)
[facility]) Care Management  08/27/16  [name] October 12, 1960  08/28/1960   Attempted to reach patient without success. Left a HIPPA compliant voicemail with RNCM contact information and invited patient to callback.  [name], RN, BSN, [place] Management Coordinator [phone]

## 2016-08-29 ENCOUNTER — Other Ambulatory Visit: Payer: Self-pay | Admitting: Pharmacist

## 2016-08-29 ENCOUNTER — Encounter: Payer: Self-pay | Admitting: Pharmacist

## 2016-08-29 DIAGNOSIS — D869 Sarcoidosis, unspecified: Secondary | ICD-10-CM | POA: Diagnosis not present

## 2016-08-29 DIAGNOSIS — M797 Fibromyalgia: Secondary | ICD-10-CM | POA: Diagnosis not present

## 2016-08-29 DIAGNOSIS — I82421 Acute embolism and thrombosis of right iliac vein: Secondary | ICD-10-CM | POA: Diagnosis not present

## 2016-08-29 DIAGNOSIS — I2782 Chronic pulmonary embolism: Secondary | ICD-10-CM | POA: Diagnosis not present

## 2016-08-29 DIAGNOSIS — M069 Rheumatoid arthritis, unspecified: Secondary | ICD-10-CM | POA: Diagnosis not present

## 2016-08-29 DIAGNOSIS — E1142 Type 2 diabetes mellitus with diabetic polyneuropathy: Secondary | ICD-10-CM | POA: Diagnosis not present

## 2016-08-29 NOTE — Telephone Encounter (Signed)
Ref was placed on 8/10 - pls see Thx

## 2016-08-29 NOTE — Patient Outreach (Signed)
[facility]) Care Management  [place]   08/29/2016  [name] [facility] May 13, 1960 [id number]  Subjective: Patient was called to follow up on medication adherence. HIPAA identifiers were obtained. Patient is a 56 year old female with multiple medical conditions including but not limited to: sarcoidosis, on Humira, morbid obesity, type 2 diabetes mellitus, depression, B 12 deficiency, vitamin d deficiency, depression, insomnia, OSA and GERD. Patient was hospitalized subsequently sent to a SNF for Pulmonary Embolus 07/2016.  Patient reported feeling out of breath because she was running out of Oxygen. She reported being discharged from the SNF on Oxygen and being given 1.5 canisters of Oxygen but nothing since then,   Objective:  Encounter Medications: Outpatient Encounter Prescriptions as of 08/29/2016  Medication Sig Note  . Adalimumab (HUMIRA) 40 MG/0.8ML PSKT Inject 40 mg into the skin every 14 (fourteen) days.   [name] albuterol (PROVENTIL HFA;VENTOLIN HFA) 108 (90 Base) MCG/ACT inhaler Inhale 2 puffs into the lungs every 6 (six) hours as needed for wheezing or shortness of breath.   . carvedilol (COREG) 12.5 MG tablet TAKE 1 TABLET BY MOUTH TWICE DAILY WITH A MEAL   . chlorhexidine (PERIDEX) 0.12 % solution Use as directed 5 mLs in the mouth or throat 2 (two) times daily. Pt is to swish and spit.   [name] diclofenac sodium (VOLTAREN) 1 % GEL Apply 2 g topically 2 (two) times daily. (Patient taking differently: Apply 2 g topically 2 (two) times daily as needed (for pain). )   . esomeprazole (NEXIUM) 40 MG capsule Take 40 mg by mouth daily before breakfast.    . fluocinonide gel (LIDEX) 1.82 % Apply 1 application topically 2 (two) times daily as needed (for sores on gums).  08/29/2016: PRN   . furosemide (LASIX) 40 MG tablet TAKE 1 TABLET BY MOUTH EVERY MORNING FOR EDEMA   . gabapentin (NEURONTIN) 300 MG capsule Take 1 capsule (300 mg total) by mouth 2 (two) times daily.  (Patient taking differently: Take 300 mg by mouth 2 (two) times daily as needed (for pain). )   . lidocaine (XYLOCAINE) 2 % solution Use as directed 20 mLs in the mouth or throat 3 (three) times daily as needed for mouth pain. Pt is to swish and spit. 08/29/2016: PRN  . metFORMIN (GLUCOPHAGE-XR) 500 MG 24 hr tablet TAKE 2 TABLETS(1000 MG) BY MOUTH DAILY WITH BREAKFAST (Patient taking differently: Take 1,000 mg by mouth daily with breakfast. )   . nortriptyline (PAMELOR) 75 MG capsule Take 1 capsule (75 mg total) by mouth at bedtime.   . potassium chloride (KLOR-CON 10) 10 MEQ tablet Take 1 tablet (10 mEq total) by mouth daily.   . predniSONE (DELTASONE) 5 MG tablet Take 5 mg by mouth daily with breakfast.   . sitaGLIPtin (JANUVIA) 50 MG tablet Take 1 tablet (50 mg total) by mouth daily.   . Suvorexant (BELSOMRA) 20 MG TABS Take 1 tablet by mouth at bedtime.   . topiramate (TOPAMAX) 50 MG tablet TAKE 1 TABLET BY MOUTH TWICE DAILY   . traMADol (ULTRAM) 50 MG tablet Take 2 tablets (100 mg total) by mouth every 6 (six) hours as needed for moderate pain.   [name] venlafaxine XR (EFFEXOR XR) 75 MG 24 hr capsule Take 1 capsule (75 mg total) by mouth daily with breakfast.   . XARELTO 20 MG TABS tablet Take 20 mg by mouth daily. with food   . [DISCONTINUED] Rivaroxaban 15 & 20 MG TBPK Take as directed on package:  Start with one 15mg  tablet by mouth twice a day with food. On Day 22, switch to one 20mg  tablet once a day with food. (Patient taking differently: Take 20 mg by mouth daily. Take as directed on package: Start with one 15mg  tablet by mouth twice a day with food. On Day 22, switch to one 20mg  tablet once a day with food.)   . [DISCONTINUED] escitalopram (LEXAPRO) 20 MG tablet TAKE 1 TABLET BY MOUTH DAILY (Patient not taking: Reported on 08/29/2016) 08/29/2016: Not taking. Now on venlafaxine  . [DISCONTINUED] methocarbamol (ROBAXIN) 500 MG tablet Take 1 tablet (500 mg total) by mouth every 8 (eight) hours as  needed for muscle spasms. (Patient not taking: Reported on 08/29/2016)    No facility-administered encounter medications on file as of 08/29/2016.     Functional Status: In your present state of health, do you have any difficulty performing the following activities: 07/21/2016 07/08/2016  Hearing? N N  Vision? N Y  Difficulty concentrating or making decisions? N Y  Walking or climbing stairs? Y Y  Dressing or bathing? N Y  Doing errands, shopping? Y Y  Conservation officer, nature and eating ? - N  Using the Toilet? - Y  In the past six months, have you accidently leaked urine? - Y  Do you have problems with loss of bowel control? - N  Managing your Medications? - Y  Managing your Finances? - Y  Housekeeping or managing your Housekeeping? - Y  Some recent data might be hidden    Fall/Depression Screening: Fall Risk  08/22/2016 07/08/2016 06/26/2016  Falls in the past year? Yes Yes Yes  Number falls in past yr: 2 or more 2 or more 2 or more  Injury with Fall? Yes Yes Yes  Risk Factor Category  High Fall Risk High Fall Risk High Fall Risk  Risk for fall due to : Impaired balance/gait;History of fall(s);Impaired mobility History of fall(s);Impaired balance/gait;Impaired mobility;Mental status change Impaired mobility;Impaired balance/gait;History of fall(s);Mental status change  Follow up Falls prevention discussed;Follow up appointment Falls evaluation completed;Education provided;Falls prevention discussed;Follow up appointment Falls evaluation completed;Falls prevention discussed   PHQ 2/9 Scores 07/08/2016 06/26/2016 04/04/2016  PHQ - 2 Score 1 1 1       Assessment:  Patient was recently discharged from hospital and all medications have been reviewed.  Dr. Judeen Hammans office was called to inquire about the patient's Oxygen order.   A message was left in the morning and the office was called back just before the end of the business day today.  The representative from the office said the request was on  Dr. Judeen Hammans desktop awaiting signature.  Medication Review Findings:  Medications removed the patient reported not taking:  Escitalopram-patient's medication list but she is not taking since she is taking Venlafaxine  Methocarbamol-patient said she is no longer taking   [place] was called to coordinate with them on the patient's pill packing system.  Spoke with the pharmacist [facility]).    Dylan confirmed the patient will receive her next medications in the pack.  A copy of the patient's current medication list was sent to [facility].  Plan:  Take the patient a pill box to help her until her pill packs begin (Friday August 31, 2016 and check on Oxygen Delivery  Schedule a home visit with the patient once her pill packs are filled.  Call patient back in 5-7 business days.  Elayne Guerin, PharmD, [place] Clinical Pharmacist [phone]

## 2016-08-30 ENCOUNTER — Other Ambulatory Visit: Payer: Self-pay | Admitting: Pharmacist

## 2016-08-30 ENCOUNTER — Encounter: Payer: Self-pay | Admitting: Pharmacist

## 2016-08-30 ENCOUNTER — Telehealth: Payer: Self-pay | Admitting: Internal Medicine

## 2016-08-30 ENCOUNTER — Other Ambulatory Visit: Payer: Self-pay

## 2016-08-30 MED ORDER — GLUCOSE BLOOD VI STRP: 1.0000 | ORAL_STRIP | Freq: Two times a day (BID) | 3 refills | Status: DC

## 2016-08-30 MED ORDER — ACCU-CHEK AVIVA PLUS W/DEVICE KIT: PACK | 0 refills | Status: DC

## 2016-08-30 MED ORDER — ACCU-CHEK SOFTCLIX LANCETS MISC: 1.0000 | Freq: Two times a day (BID) | 3 refills | Status: DC

## 2016-08-30 NOTE — Patient Outreach (Signed)
[facility] [facility]) Care Management  [place]   08/30/2016  Ozell Ferrera [facility] [birth year] [id number]  Subjective: Pill box was delivered to the patient at her home today. HIPAA identifiers were obtained.   Patient is a 56 year old female with multiple medical conditions including but not limited to: sarcoidosis, on Humira, morbid obesity, type 2 diabetes mellitus, depression, B 12 deficiency, vitamin d deficiency, depression, insomnia, OSA and GERD. Patient was hospitalized subsequently sent to a SNF for Pulmonary Embolus 07/2016.  Objective:   Encounter Medications: Outpatient Encounter Prescriptions as of 08/30/2016  Medication Sig Note  . Adalimumab (HUMIRA) 40 MG/0.8ML PSKT Inject 40 mg into the skin every 14 (fourteen) days.   [name] albuterol (PROVENTIL HFA;VENTOLIN HFA) 108 (90 Base) MCG/ACT inhaler Inhale 2 puffs into the lungs every 6 (six) hours as needed for wheezing or shortness of breath.   . carvedilol (COREG) 12.5 MG tablet TAKE 1 TABLET BY MOUTH TWICE DAILY WITH A MEAL   . chlorhexidine (PERIDEX) 0.12 % solution Use as directed 5 mLs in the mouth or throat 2 (two) times daily. Pt is to swish and spit.   [name] diclofenac sodium (VOLTAREN) 1 % GEL Apply 2 g topically 2 (two) times daily. (Patient taking differently: Apply 2 g topically 2 (two) times daily as needed (for pain). )   . esomeprazole (NEXIUM) 40 MG capsule Take 40 mg by mouth daily before breakfast.    . fluocinonide gel (LIDEX) 8.29 % Apply 1 application topically 2 (two) times daily as needed (for sores on gums).  08/29/2016: PRN   . furosemide (LASIX) 40 MG tablet TAKE 1 TABLET BY MOUTH EVERY MORNING FOR EDEMA   . gabapentin (NEURONTIN) 300 MG capsule Take 1 capsule (300 mg total) by mouth 2 (two) times daily. (Patient taking differently: Take 300 mg by mouth 2 (two) times daily as needed (for pain). )   . lidocaine (XYLOCAINE) 2 % solution Use as directed 20 mLs in the mouth or throat 3 (three) times  daily as needed for mouth pain. Pt is to swish and spit. 08/29/2016: PRN  . metFORMIN (GLUCOPHAGE-XR) 500 MG 24 hr tablet TAKE 2 TABLETS(1000 MG) BY MOUTH DAILY WITH BREAKFAST (Patient taking differently: Take 1,000 mg by mouth daily with breakfast. )   . nortriptyline (PAMELOR) 75 MG capsule Take 1 capsule (75 mg total) by mouth at bedtime.   . potassium chloride (KLOR-CON 10) 10 MEQ tablet Take 1 tablet (10 mEq total) by mouth daily.   . predniSONE (DELTASONE) 5 MG tablet Take 5 mg by mouth daily with breakfast.   . sitaGLIPtin (JANUVIA) 50 MG tablet Take 1 tablet (50 mg total) by mouth daily.   . Suvorexant (BELSOMRA) 20 MG TABS Take 1 tablet by mouth at bedtime.   . topiramate (TOPAMAX) 50 MG tablet TAKE 1 TABLET BY MOUTH TWICE DAILY   . traMADol (ULTRAM) 50 MG tablet Take 2 tablets (100 mg total) by mouth every 6 (six) hours as needed for moderate pain.   [name] venlafaxine XR (EFFEXOR XR) 75 MG 24 hr capsule Take 1 capsule (75 mg total) by mouth daily with breakfast.   . XARELTO 20 MG TABS tablet Take 20 mg by mouth daily. with food    No facility-administered encounter medications on file as of 08/30/2016.     Functional Status: In your present state of health, do you have any difficulty performing the following activities: 07/21/2016 07/08/2016  Hearing? N N  Vision? N Y  Difficulty  concentrating or making decisions? N Y  Walking or climbing stairs? Y Y  Dressing or bathing? N Y  Doing errands, shopping? Y Y  Conservation officer, nature and eating ? - N  Using the Toilet? - Y  In the past six months, have you accidently leaked urine? - Y  Do you have problems with loss of bowel control? - N  Managing your Medications? - Y  Managing your Finances? - Y  Housekeeping or managing your Housekeeping? - Y  Some recent data might be hidden    Fall/Depression Screening: Fall Risk  08/22/2016 07/08/2016 06/26/2016  Falls in the past year? Yes Yes Yes  Number falls in past yr: 2 or more 2 or more 2 or more   Injury with Fall? Yes Yes Yes  Risk Factor Category  High Fall Risk High Fall Risk High Fall Risk  Risk for fall due to : Impaired balance/gait;History of fall(s);Impaired mobility History of fall(s);Impaired balance/gait;Impaired mobility;Mental status change Impaired mobility;Impaired balance/gait;History of fall(s);Mental status change  Follow up Falls prevention discussed;Follow up appointment Falls evaluation completed;Education provided;Falls prevention discussed;Follow up appointment Falls evaluation completed;Falls prevention discussed   PHQ 2/9 Scores 07/08/2016 06/26/2016 04/04/2016  PHQ - 2 Score 1 1 1       Assessment: Patient was recently discharged from hospital and all medications have been reviewed.   Drugs sorted by system:  Neurologic/Psychologic: Venlafaxine XR Belsomra Nortriptyline Topiramate Gabapentin  Cardiovascular: Carvedilol Furosemide Xarelto   Pulmonary/Allergy: Albuterol HFA  Gastrointestinal: esomeprazole  Endocrine: Januvia Metformin XR  Topical: Fluocinonide gel Voltaren gel   Pain: Tramadol  Autoimmune Humira Prednisone  Vitamins/Minerals: Potassium  Miscellaneous: Chlorhexidine Lidocaine Solution   Education Provided: Patient was shown how to fill her pill box  [place] was called to inquire about the patient's diabetic supplies. The representative at [facility] said they did not have any diabetic supplies on file for the patient.  Dr. Judeen Hammans office was called and a request was made to send new prescriptions for the patient's diabetic supplies to [facility].  An inbasket message was sent to Dr. Cordelia Pen CMA, Jinny Blossom to request prescriptions for the patient's diabetic supplies be sent to [facility] as well.  Patient said someone from Dr. Judeen Hammans office called her this morning and said someone from [facility] should be calling her today about her  Oxygen.  Review of the patient's chart revealed [facility] had a referral/order but was waiting on some additional clinical information from the provider. Patient was encouraged to call the office about her Oxygen before the end of the business day.  Plan:  Call the patient in 5-7 business days to follow up on her pill packs and oxygen.   Elayne Guerin, PharmD, [place] Clinical Pharmacist [phone]

## 2016-08-30 NOTE — Telephone Encounter (Signed)
I have reviewed the patient's medical history sent diabetic monitor w/supplies to [name] KitchenJohny Garrison

## 2016-08-30 NOTE — Patient Outreach (Signed)
[facility] [facility]) Care Management  08/30/16  [place] 1960-02-28 [id number]  Successful outreach completed with patient. Patient identification verified.   Patient stated that she does not have any oxygen. Per review, patient had shared this information with [facility] pharmacist who had also placed a call to PCP office for follow up. She stated that she ([name]) had called her doctor's office today and they told her that they have all the paperwork and it is on Dr. Judeen Hammans desk waiting for a signature. She stated that she has been out of oxygen since last Sunday. She stated that her home health PT was also trying to help. She stated that she is concerned because she was told at the SNF by [name], her PT there that she had to wear it at all times. She stated that she is occasionally experiencing some shortness of breath, but she is taking her time and stops to do deep breathing exercises that her PT taught her that are helping. She stated that she feels "trapped in her house."   Patient stated that her home health PT has been checking her oxygen level and that it drops down to 86-87% when she is up walking. She stated that it takes a few minutes of rest, but it does come back up to about 93%.   She stated that she is going out to eat with her son this weekend and plans to have him drop her off at the door so that she does not have to walk far and get so short of breath. She is looking forward to getting out with her son.  RNCM advised that since MD office is currently closed, will follow up with [place] and PCP office on Monday morning and offer assistance in getting this taken care of. RNCM provided education about oxygen therapy and that there are certain requirements that must be met in order for her to be eligible for oxygen therapy and for her insurance to pay for it.   Plan: RNCM advised patient that if her condition worsens or she gets short  of breath and cannot catch her breath or recover to call 911 and patient verbalized understanding.   RNCM to reach out to [place] and Dr. Judeen Hammans office on Monday.  Home visit scheduled for next week.  RNCM encouraged patient to call RNCM with any questions or concerns.  [name], RN, BSN, [place] Management Coordinator [phone]

## 2016-08-30 NOTE — Telephone Encounter (Signed)
Notified pt w/MD response. Per records [facility] has been notified [name] is needing more clinical information msg has been sent to MD.../lmb

## 2016-08-30 NOTE — Telephone Encounter (Signed)
Pt need dibetic  supplies sent to: [facility]

## 2016-09-02 ENCOUNTER — Other Ambulatory Visit: Payer: Self-pay

## 2016-09-02 NOTE — Patient Outreach (Signed)
[place] [facility]) Care Management  09/02/16  [place] August 15, 1960 [id number]  Successful outreach completed with [place] regarding patient's order for home oxygen, spoke with Adriane (referral specialist). She stated that the patient did not qualify for oxygen when they initially received the order from the nursing facility and they were notified of determination. They have since requested additional information in order to process the referral. They need the following information:  Chronic respiratory diagnosis with symptoms, notes as to why she needs the O2 and notes that an alternative method was tried and failed. Also order must include the words "stationary" and "portable" oxygen.   She stated that once they get this information, they will be able to move forward with the order if she qualifies.   [name] Holten Spano, RN, BSN, [place] Management Coordinator [phone]

## 2016-09-02 NOTE — Patient Outreach (Signed)
09/02/16  [place] [birth year] [id number]  RNCM left voicemail for Cecille Rubin (Patient care coordinator) with Dr. Judeen Hammans office regarding patient's order for O2. Requested callback to discuss patient's oxygen needs and follow up regarding the information needed by [place].  RNCM contact information provided and requested callback.  Update: RNCM received voicemail from Garrison at [facility] [phone]) today at 11:46 am stating that she had sent all of the information that [place] to to Dr. Judeen Hammans assistant. She stated that she did go and talk with her and she will be checking up on it. She stated that they hopefully have everything that they need done.   [name] Dacen Frayre, RN, BSN, [place] Management Coordinator [phone]

## 2016-09-03 ENCOUNTER — Other Ambulatory Visit: Payer: Self-pay

## 2016-09-03 DIAGNOSIS — D869 Sarcoidosis, unspecified: Secondary | ICD-10-CM

## 2016-09-03 DIAGNOSIS — M069 Rheumatoid arthritis, unspecified: Secondary | ICD-10-CM | POA: Diagnosis not present

## 2016-09-03 DIAGNOSIS — M797 Fibromyalgia: Secondary | ICD-10-CM | POA: Diagnosis not present

## 2016-09-03 DIAGNOSIS — R0902 Hypoxemia: Secondary | ICD-10-CM

## 2016-09-03 DIAGNOSIS — E1142 Type 2 diabetes mellitus with diabetic polyneuropathy: Secondary | ICD-10-CM | POA: Diagnosis not present

## 2016-09-03 DIAGNOSIS — I2699 Other pulmonary embolism without acute cor pulmonale: Secondary | ICD-10-CM

## 2016-09-03 DIAGNOSIS — I2782 Chronic pulmonary embolism: Secondary | ICD-10-CM | POA: Diagnosis not present

## 2016-09-03 DIAGNOSIS — I82421 Acute embolism and thrombosis of right iliac vein: Secondary | ICD-10-CM | POA: Diagnosis not present

## 2016-09-03 MED ORDER — ACCU-CHEK SOFTCLIX LANCETS MISC: 1.0000 | Freq: Two times a day (BID) | 3 refills | Status: DC

## 2016-09-03 MED ORDER — GLUCOSE BLOOD VI STRP: 1.0000 | ORAL_STRIP | Freq: Two times a day (BID) | 3 refills | Status: DC

## 2016-09-03 NOTE — Patient Outreach (Signed)
[facility]) Care Management  [place]  09/03/2016   [name] [facility] 1960/12/05 [id number]   Home visit completed with patient. Patient's home health PT [name]) was also present for home visit.  Subjective:   Objective: Blood pressure 132/84, resp. rate (!) 84, SpO2 93 %.  Encounter Medications:  Outpatient Encounter Prescriptions as of 09/03/2016  Medication Sig Note  . ACCU-CHEK SOFTCLIX LANCETS lancets 1 each by Other route 2 (two) times daily. Use to check blood sugars twice a day   . Adalimumab (HUMIRA) 40 MG/0.8ML PSKT Inject 40 mg into the skin every 14 (fourteen) days.   [name] albuterol (PROVENTIL HFA;VENTOLIN HFA) 108 (90 Base) MCG/ACT inhaler Inhale 2 puffs into the lungs every 6 (six) hours as needed for wheezing or shortness of breath.   . Blood Glucose Monitoring Suppl (ACCU-CHEK AVIVA PLUS) w/Device KIT Use as directed to check blood sugars   . carvedilol (COREG) 12.5 MG tablet TAKE 1 TABLET BY MOUTH TWICE DAILY WITH A MEAL   . chlorhexidine (PERIDEX) 0.12 % solution Use as directed 5 mLs in the mouth or throat 2 (two) times daily. Pt is to swish and spit.   [name] diclofenac sodium (VOLTAREN) 1 % GEL Apply 2 g topically 2 (two) times daily. (Patient taking differently: Apply 2 g topically 2 (two) times daily as needed (for pain). )   . esomeprazole (NEXIUM) 40 MG capsule Take 40 mg by mouth daily before breakfast.    . fluocinonide gel (LIDEX) 4.27 % Apply 1 application topically 2 (two) times daily as needed (for sores on gums).  08/29/2016: PRN   . furosemide (LASIX) 40 MG tablet TAKE 1 TABLET BY MOUTH EVERY MORNING FOR EDEMA   . gabapentin (NEURONTIN) 300 MG capsule Take 1 capsule (300 mg total) by mouth 2 (two) times daily. (Patient taking differently: Take 300 mg by mouth 2 (two) times daily as needed (for pain). )   . glucose blood (ACCU-CHEK AVIVA PLUS) test strip 1 each by Other route 2 (two) times daily. Use to check blood sugars twice a day    . lidocaine (XYLOCAINE) 2 % solution Use as directed 20 mLs in the mouth or throat 3 (three) times daily as needed for mouth pain. Pt is to swish and spit. 08/29/2016: PRN  . metFORMIN (GLUCOPHAGE-XR) 500 MG 24 hr tablet TAKE 2 TABLETS(1000 MG) BY MOUTH DAILY WITH BREAKFAST (Patient taking differently: Take 1,000 mg by mouth daily with breakfast. )   . nortriptyline (PAMELOR) 75 MG capsule Take 1 capsule (75 mg total) by mouth at bedtime.   . potassium chloride (KLOR-CON 10) 10 MEQ tablet Take 1 tablet (10 mEq total) by mouth daily.   . predniSONE (DELTASONE) 5 MG tablet Take 5 mg by mouth daily with breakfast.   . sitaGLIPtin (JANUVIA) 50 MG tablet Take 1 tablet (50 mg total) by mouth daily.   . Suvorexant (BELSOMRA) 20 MG TABS Take 1 tablet by mouth at bedtime.   . topiramate (TOPAMAX) 50 MG tablet TAKE 1 TABLET BY MOUTH TWICE DAILY   . traMADol (ULTRAM) 50 MG tablet Take 2 tablets (100 mg total) by mouth every 6 (six) hours as needed for moderate pain.   [name] venlafaxine XR (EFFEXOR XR) 75 MG 24 hr capsule Take 1 capsule (75 mg total) by mouth daily with breakfast.   . XARELTO 20 MG TABS tablet Take 20 mg by mouth daily. with food    No facility-administered encounter medications on file as of 09/03/2016.  Functional Status:  In your present state of health, do you have any difficulty performing the following activities: 07/21/2016 07/08/2016  Hearing? N N  Vision? N Y  Difficulty concentrating or making decisions? N Y  Walking or climbing stairs? Y Y  Dressing or bathing? N Y  Doing errands, shopping? Y Y  Conservation officer, nature and eating ? - N  Using the Toilet? - Y  In the past six months, have you accidently leaked urine? - Y  Do you have problems with loss of bowel control? - N  Managing your Medications? - Y  Managing your Finances? - Y  Housekeeping or managing your Housekeeping? - Y  Some recent data might be hidden    Fall/Depression Screening: Fall Risk  08/22/2016 07/08/2016  06/26/2016  Falls in the past year? Yes Yes Yes  Number falls in past yr: 2 or more 2 or more 2 or more  Injury with Fall? Yes Yes Yes  Risk Factor Category  High Fall Risk High Fall Risk High Fall Risk  Risk for fall due to : Impaired balance/gait;History of fall(s);Impaired mobility History of fall(s);Impaired balance/gait;Impaired mobility;Mental status change Impaired mobility;Impaired balance/gait;History of fall(s);Mental status change  Follow up Falls prevention discussed;Follow up appointment Falls evaluation completed;Education provided;Falls prevention discussed;Follow up appointment Falls evaluation completed;Falls prevention discussed   PHQ 2/9 Scores 07/08/2016 06/26/2016 04/04/2016  PHQ - 2 Score _0 Assessment:  RNCM advised patient and [name], PT that patient requires overnight oximetry testing in order to determine if she qualifies for home O2. Advised that she left [place] place, she did not have a qualifying reading. Advised that per Dr. Judeen Hammans ofice, [facility] will be sending what they need to do this testing. Patient verbalized frustration with the process because she has sarcoidosis in her lung that affects her ability to breathe. Her oxygen level decreased down to 83% while doing exercises/standing and walking with PT. Patient's oxygen recovered to 93% on RA after exercises and a few moments of rest. After several minutes of rest, she stated that she was not even winded and her sats were up to 95%.  Patient complains of pain in her left lower extremity in her calf area. It is painful to touch, but it Is not deep. Patient stated that the execises are "aggravating" to her leg pain. Patient's PT assisted her with ambulation outside and patient got short of breath and had to take a rest break. Her sats after ambulating outside were 85% and pulse was up to 90.  RNCM left visit early to allow PT to continue working with patient.  Plan: RNCM to continue to follow patient for  transition of care.  Angela R. Melvinia Ashby, RN, BSN, [place] Management Coordinator [phone]

## 2016-09-04 DIAGNOSIS — Z96653 Presence of artificial knee joint, bilateral: Secondary | ICD-10-CM

## 2016-09-04 DIAGNOSIS — F319 Bipolar disorder, unspecified: Secondary | ICD-10-CM | POA: Diagnosis not present

## 2016-09-04 DIAGNOSIS — I2510 Atherosclerotic heart disease of native coronary artery without angina pectoris: Secondary | ICD-10-CM | POA: Diagnosis not present

## 2016-09-04 DIAGNOSIS — M797 Fibromyalgia: Secondary | ICD-10-CM | POA: Diagnosis not present

## 2016-09-04 DIAGNOSIS — Z9181 History of falling: Secondary | ICD-10-CM

## 2016-09-04 DIAGNOSIS — M069 Rheumatoid arthritis, unspecified: Secondary | ICD-10-CM | POA: Diagnosis not present

## 2016-09-04 DIAGNOSIS — Z9981 Dependence on supplemental oxygen: Secondary | ICD-10-CM

## 2016-09-04 DIAGNOSIS — E1142 Type 2 diabetes mellitus with diabetic polyneuropathy: Secondary | ICD-10-CM | POA: Diagnosis not present

## 2016-09-04 DIAGNOSIS — F410 Panic disorder [episodic paroxysmal anxiety] without agoraphobia: Secondary | ICD-10-CM | POA: Diagnosis not present

## 2016-09-04 DIAGNOSIS — I82421 Acute embolism and thrombosis of right iliac vein: Secondary | ICD-10-CM | POA: Diagnosis not present

## 2016-09-04 DIAGNOSIS — D869 Sarcoidosis, unspecified: Secondary | ICD-10-CM | POA: Diagnosis not present

## 2016-09-04 DIAGNOSIS — I2782 Chronic pulmonary embolism: Secondary | ICD-10-CM | POA: Diagnosis not present

## 2016-09-04 DIAGNOSIS — Z7984 Long term (current) use of oral hypoglycemic drugs: Secondary | ICD-10-CM

## 2016-09-04 DIAGNOSIS — I10 Essential (primary) hypertension: Secondary | ICD-10-CM | POA: Diagnosis not present

## 2016-09-04 DIAGNOSIS — Z7952 Long term (current) use of systemic steroids: Secondary | ICD-10-CM

## 2016-09-04 DIAGNOSIS — Z6841 Body Mass Index (BMI) 40.0 and over, adult: Secondary | ICD-10-CM

## 2016-09-04 DIAGNOSIS — E46 Unspecified protein-calorie malnutrition: Secondary | ICD-10-CM | POA: Diagnosis not present

## 2016-09-05 ENCOUNTER — Other Ambulatory Visit: Payer: Self-pay | Admitting: Pharmacist

## 2016-09-05 NOTE — Patient Outreach (Signed)
[facility]) Care Management  09/05/2016  [place] October 05, 1960 [id number]   [place] was called on the patient's behalf to follow up on pill packing and her diabetic supplies. Patient's diabetic supplies were ordered by her PCP as requested and sent to [facility]. Unfortunately, [place] cannot bill to Medicare Part B so they put the patient's testing supplies on hold.  Patient's pill pack can be delivered on 09/10/16  Patient was called to let her know the status of the pill packs and diabetic supplies but she did not answer the phone. HIPAA compliant message was left on the patient's voicemail.  Plan:  Await a call back from the patient . Follow up in 5-7 business days if no answer.    Elayne Guerin, PharmD, [place] Clinical Pharmacist [phone]

## 2016-09-09 ENCOUNTER — Other Ambulatory Visit: Payer: Self-pay | Admitting: Pharmacist

## 2016-09-09 NOTE — Patient Outreach (Addendum)
[facility]) Care Management  09/09/2016  [place] 1961-01-13 [id number]   Patient was called to follow up on pill packs and diabetic supplies. HIPAA identifiers were obtained. [place] is set to deliver the patient's pill packs on 09/10/16.  Unfortunately, they cannot bill the patient's diabetic supplies.  She will have to continue to get her diabetic supplies from [place].  Patient communicated understanding.  Plan:  Call patient within 3 business days to follow up on pill packs.   Elayne Guerin, PharmD, [place] Clinical Pharmacist [phone]

## 2016-09-10 ENCOUNTER — Ambulatory Visit: Payer: Medicare Other | Admitting: Internal Medicine

## 2016-09-10 DIAGNOSIS — D869 Sarcoidosis, unspecified: Secondary | ICD-10-CM | POA: Diagnosis not present

## 2016-09-10 DIAGNOSIS — M069 Rheumatoid arthritis, unspecified: Secondary | ICD-10-CM | POA: Diagnosis not present

## 2016-09-10 DIAGNOSIS — E1142 Type 2 diabetes mellitus with diabetic polyneuropathy: Secondary | ICD-10-CM | POA: Diagnosis not present

## 2016-09-10 DIAGNOSIS — I82421 Acute embolism and thrombosis of right iliac vein: Secondary | ICD-10-CM | POA: Diagnosis not present

## 2016-09-10 DIAGNOSIS — M797 Fibromyalgia: Secondary | ICD-10-CM | POA: Diagnosis not present

## 2016-09-10 DIAGNOSIS — I2782 Chronic pulmonary embolism: Secondary | ICD-10-CM | POA: Diagnosis not present

## 2016-09-12 ENCOUNTER — Telehealth: Payer: Self-pay | Admitting: *Deleted

## 2016-09-12 ENCOUNTER — Other Ambulatory Visit: Payer: Self-pay

## 2016-09-12 ENCOUNTER — Other Ambulatory Visit: Payer: Self-pay | Admitting: Pharmacist

## 2016-09-12 DIAGNOSIS — M069 Rheumatoid arthritis, unspecified: Secondary | ICD-10-CM | POA: Diagnosis not present

## 2016-09-12 DIAGNOSIS — D869 Sarcoidosis, unspecified: Secondary | ICD-10-CM | POA: Diagnosis not present

## 2016-09-12 DIAGNOSIS — M797 Fibromyalgia: Secondary | ICD-10-CM | POA: Diagnosis not present

## 2016-09-12 DIAGNOSIS — I2782 Chronic pulmonary embolism: Secondary | ICD-10-CM | POA: Diagnosis not present

## 2016-09-12 DIAGNOSIS — E1142 Type 2 diabetes mellitus with diabetic polyneuropathy: Secondary | ICD-10-CM | POA: Diagnosis not present

## 2016-09-12 DIAGNOSIS — I82421 Acute embolism and thrombosis of right iliac vein: Secondary | ICD-10-CM | POA: Diagnosis not present

## 2016-09-12 NOTE — Telephone Encounter (Signed)
[name],. Did you received any faxed refills on this patient. I received [place] msg below and I check to see if any thing has been escribe, and it hadn't...[name]

## 2016-09-12 NOTE — Telephone Encounter (Signed)
Refills  Received: Today  Message Contents  Elayne Guerin, RPH  Brand, Tobie Poet, MA        Good afternoon [place]. My name is Denyse Amass and I am a Nurse, adult with [place] [facility] [place] sent over some refill requests for Ms. Neitzke on 09/05/16 and then again today. Can you see if your office receive this request?   Thank you so much!   (I have been working with Ms. [place] utilize a pill packing system (provided by [facility]) to assist with medication adherence)   Blessings,   Elayne Guerin, PharmD, [place] Clinical Pharmacist  [phone]

## 2016-09-13 ENCOUNTER — Ambulatory Visit: Payer: Self-pay | Admitting: Pharmacist

## 2016-09-13 MED ORDER — XARELTO 20 MG PO TABS: 20.0000 mg | ORAL_TABLET | Freq: Every day | ORAL | 0 refills | Status: DC

## 2016-09-13 MED ORDER — GABAPENTIN 300 MG PO CAPS
300.0000 mg | ORAL_CAPSULE | Freq: Two times a day (BID) | ORAL | 1 refills | Status: DC | PRN
Start: 2016-09-13 — End: 2017-01-09

## 2016-09-13 MED ORDER — SUVOREXANT 20 MG PO TABS: 1.0000 | ORAL_TABLET | Freq: Every day | ORAL | 0 refills | Status: DC

## 2016-09-13 MED ORDER — TOPIRAMATE 50 MG PO TABS: 50.0000 mg | ORAL_TABLET | Freq: Two times a day (BID) | ORAL | 1 refills | Status: DC

## 2016-09-13 MED ORDER — NORTRIPTYLINE HCL 75 MG PO CAPS: 75.0000 mg | ORAL_CAPSULE | Freq: Every day | ORAL | 0 refills | Status: DC

## 2016-09-13 MED ORDER — ALBUTEROL SULFATE HFA 108 (90 BASE) MCG/ACT IN AERS: 2.0000 | INHALATION_SPRAY | Freq: Four times a day (QID) | RESPIRATORY_TRACT | 3 refills | Status: DC | PRN

## 2016-09-13 MED ORDER — FUROSEMIDE 40 MG PO TABS: ORAL_TABLET | ORAL | 1 refills | Status: DC

## 2016-09-13 MED ORDER — VENLAFAXINE HCL ER 75 MG PO CP24: 75.0000 mg | ORAL_CAPSULE | Freq: Every day | ORAL | 0 refills | Status: DC

## 2016-09-13 MED ORDER — DICLOFENAC SODIUM 1 % TD GEL: 2.0000 g | Freq: Two times a day (BID) | TRANSDERMAL | 2 refills | Status: DC | PRN

## 2016-09-13 MED ORDER — CARVEDILOL 12.5 MG PO TABS: ORAL_TABLET | ORAL | 1 refills | Status: DC

## 2016-09-13 MED ORDER — CHLORHEXIDINE GLUCONATE 0.12 % MT SOLN: 5.0000 mL | Freq: Two times a day (BID) | OROMUCOSAL | 0 refills | Status: DC

## 2016-09-13 NOTE — Addendum Note (Signed)
Addended by: Earnstine Regal on: 09/13/2016 03:00 PM   Modules accepted: Orders

## 2016-09-13 NOTE — Telephone Encounter (Addendum)
Received staff msg (see below) rent remaining refill to [facility] pharmacy...[name]  Elayne Guerin, [place]  Brand, Tobie Poet, MA        Hey there! Thank you so much for helping out. I see the note in Epic with refills but patient still needs refills on: Xarelto, venlafaxine, nortiptyline and Bellsomra as well--sent to [facility].    Thank you!   Previous Messages     Per pharmacist amanda too soon to have [place] refilled last filed 08/22/16 next refilled 09/22/16...[name]

## 2016-09-13 NOTE — Patient Outreach (Signed)
[facility]) Care Management  09/13/2016  [place] 1960-07-05 [id number]   [place] was called to follow up on the status of the patient's pill packs. Darlene at [facility] said refills were still needed on: Xarelto Metformin Venlafaxine Belsomra   Dr. Judeen Hammans office was contacted again to request refills on Xarelto, Metformin and Belsomra  Dr. Cordelia Pen office was contacted to request metformin be sent to [facility].  Patient was contacted. HIPAA identifiers were obtained. The status of her pill packs was shared.    Plan:  Follow up on status of refills next week.   Elayne Guerin, PharmD, [place] Clinical Pharmacist [phone]

## 2016-09-13 NOTE — Addendum Note (Signed)
Addended by: Earnstine Regal on: 09/13/2016 11:59 AM   Modules accepted: Orders

## 2016-09-13 NOTE — Telephone Encounter (Addendum)
Rec'd paper faxed out of MD box upfront w/faxes waiting to be picked up. Pt needing refills on Topiramte, Chlorhexidine, furosemide, lidocaine, ventolin, voltaren, gabapentin, and carvedilol.pt is up-to-date sent refill electronically../l;mb

## 2016-09-13 NOTE — Patient Outreach (Incomplete)
[facility]) Care Management  Late entry for 09/12/2016  [place] 03/07/1960 [id number]

## 2016-09-17 ENCOUNTER — Telehealth: Payer: Self-pay | Admitting: Internal Medicine

## 2016-09-17 NOTE — Telephone Encounter (Signed)
Ok Thx 

## 2016-09-17 NOTE — Telephone Encounter (Signed)
Mark with [facility] at home  [phone]   Need verbal for PT  2 week 4 1 week 1 starting sept 2nd

## 2016-09-18 ENCOUNTER — Other Ambulatory Visit: Payer: Self-pay

## 2016-09-18 ENCOUNTER — Ambulatory Visit: Payer: Self-pay | Admitting: Pharmacist

## 2016-09-18 DIAGNOSIS — M069 Rheumatoid arthritis, unspecified: Secondary | ICD-10-CM | POA: Diagnosis not present

## 2016-09-18 DIAGNOSIS — D869 Sarcoidosis, unspecified: Secondary | ICD-10-CM | POA: Diagnosis not present

## 2016-09-18 DIAGNOSIS — I82421 Acute embolism and thrombosis of right iliac vein: Secondary | ICD-10-CM | POA: Diagnosis not present

## 2016-09-18 DIAGNOSIS — M797 Fibromyalgia: Secondary | ICD-10-CM | POA: Diagnosis not present

## 2016-09-18 DIAGNOSIS — E1142 Type 2 diabetes mellitus with diabetic polyneuropathy: Secondary | ICD-10-CM | POA: Diagnosis not present

## 2016-09-18 DIAGNOSIS — I2782 Chronic pulmonary embolism: Secondary | ICD-10-CM | POA: Diagnosis not present

## 2016-09-18 MED ORDER — METFORMIN HCL ER 500 MG PO TB24: ORAL_TABLET | ORAL | 0 refills | Status: DC

## 2016-09-18 NOTE — Telephone Encounter (Signed)
Received fax from [facility] for Prednisone 10mg  and Fluocinonide 0.05% gel. These were in as historical, please advise about refill.

## 2016-09-18 NOTE — Telephone Encounter (Signed)
Notified Mark w/MD response.Marland KitchenJohny Chess

## 2016-09-19 ENCOUNTER — Encounter: Payer: Self-pay | Admitting: Pharmacist

## 2016-09-19 ENCOUNTER — Other Ambulatory Visit: Payer: Self-pay | Admitting: Pharmacist

## 2016-09-19 ENCOUNTER — Ambulatory Visit: Payer: Self-pay | Admitting: Pharmacist

## 2016-09-19 DIAGNOSIS — E1142 Type 2 diabetes mellitus with diabetic polyneuropathy: Secondary | ICD-10-CM | POA: Diagnosis not present

## 2016-09-19 DIAGNOSIS — D869 Sarcoidosis, unspecified: Secondary | ICD-10-CM | POA: Diagnosis not present

## 2016-09-19 DIAGNOSIS — M797 Fibromyalgia: Secondary | ICD-10-CM | POA: Diagnosis not present

## 2016-09-19 DIAGNOSIS — I2782 Chronic pulmonary embolism: Secondary | ICD-10-CM | POA: Diagnosis not present

## 2016-09-19 DIAGNOSIS — I82421 Acute embolism and thrombosis of right iliac vein: Secondary | ICD-10-CM | POA: Diagnosis not present

## 2016-09-19 DIAGNOSIS — M069 Rheumatoid arthritis, unspecified: Secondary | ICD-10-CM | POA: Diagnosis not present

## 2016-09-19 NOTE — Patient Outreach (Signed)
[facility]) Care Management  [place]   09/19/2016  Alain[place] Stawicki [facility] [birth year] [id number]  Subjective: Home visit conducted at the patient's home to inspect her pill packs from [facility].  Patient called on 09/18/2016 confused and saying several of her medications were missing.  Patient is a 56 year old female with multiple medical conditions including but not limited to:   sarcoidosis, on Humira, morbid obesity, type 2 diabetes mellitus, depression, B 12 deficiency, vitamin d deficiency, depression, insomnia, OSA and GERD.  While the pharmacy home visit was being completed, the patient's physical therapist was in the home as well.  Objective:  Vitals:   09/19/16 2035  BP: 128/84  Pulse: 95  SpO2: 93%     Encounter Medications: Outpatient Encounter Prescriptions as of 09/19/2016  Medication Sig Note  . ACCU-CHEK SOFTCLIX LANCETS lancets 1 each by Other route 2 (two) times daily. Use to check blood sugars twice a day   . Adalimumab (HUMIRA) 40 MG/0.8ML PSKT Inject 40 mg into the skin every 14 (fourteen) days.   [name] albuterol (PROVENTIL HFA;VENTOLIN HFA) 108 (90 Base) MCG/ACT inhaler Inhale 2 puffs into the lungs every 6 (six) hours as needed for wheezing or shortness of breath.   . Blood Glucose Monitoring Suppl (ACCU-CHEK AVIVA PLUS) w/Device KIT Use as directed to check blood sugars   . carvedilol (COREG) 12.5 MG tablet TAKE 1 TABLET BY MOUTH TWICE DAILY WITH A MEAL   . chlorhexidine (PERIDEX) 0.12 % solution Use as directed 5 mLs in the mouth or throat 2 (two) times daily. Pt is to swish and spit.   [name] diclofenac sodium (VOLTAREN) 1 % GEL Apply 2 g topically 2 (two) times daily as needed (for pain).   [name] esomeprazole (NEXIUM) 40 MG capsule Take 40 mg by mouth daily before breakfast.    . fluocinonide gel (LIDEX) 6.38 % Apply 1 application topically 2 (two) times daily as needed (for sores on gums).  08/29/2016: PRN   . furosemide (LASIX)  40 MG tablet TAKE 1 TABLET BY MOUTH EVERY MORNING FOR EDEMA   . gabapentin (NEURONTIN) 300 MG capsule Take 1 capsule (300 mg total) by mouth 2 (two) times daily as needed (for pain).   [name] glucose blood (ACCU-CHEK AVIVA PLUS) test strip 1 each by Other route 2 (two) times daily. Use to check blood sugars twice a day   . lidocaine (XYLOCAINE) 2 % solution Use as directed 20 mLs in the mouth or throat 3 (three) times daily as needed for mouth pain. Pt is to swish and spit. 08/29/2016: PRN  . metFORMIN (GLUCOPHAGE-XR) 500 MG 24 hr tablet TAKE 2 TABLETS(1000 MG) BY MOUTH DAILY WITH BREAKFAST   . nortriptyline (PAMELOR) 75 MG capsule Take 1 capsule (75 mg total) by mouth at bedtime.   . potassium chloride (KLOR-CON 10) 10 MEQ tablet Take 1 tablet (10 mEq total) by mouth daily.   . sitaGLIPtin (JANUVIA) 50 MG tablet Take 1 tablet (50 mg total) by mouth daily.   . Suvorexant (BELSOMRA) 20 MG TABS Take 1 tablet by mouth at bedtime.   . topiramate (TOPAMAX) 50 MG tablet Take 1 tablet (50 mg total) by mouth 2 (two) times daily.   [name] venlafaxine XR (EFFEXOR XR) 75 MG 24 hr capsule Take 1 capsule (75 mg total) by mouth daily with breakfast.   . XARELTO 20 MG TABS tablet Take 1 tablet (20 mg total) by mouth daily. with food   . traMADol Veatrice Bourbon)  50 MG tablet Take 2 tablets (100 mg total) by mouth every 6 (six) hours as needed for moderate pain. (Patient not taking: Reported on 09/19/2016)    No facility-administered encounter medications on file as of 09/19/2016.     Functional Status: In your present state of health, do you have any difficulty performing the following activities: 09/03/2016 07/21/2016  Hearing? N N  Vision? Y N  Comment needs glasses but cannot afford new ones; hers are over 50 years old -  Difficulty concentrating or making decisions? Y N  Comment "sometimes forgets" -  Walking or climbing stairs? Y Y  Dressing or bathing? N N  Doing errands, shopping? N Y  Conservation officer, nature and eating ? N -   Using the Toilet? N -  In the past six months, have you accidently leaked urine? N -  Do you have problems with loss of bowel control? N -  Managing your Medications? N -  Managing your Finances? N -  Housekeeping or managing your Housekeeping? N -  Some recent data might be hidden    Fall/Depression Screening: Fall Risk  08/22/2016 07/08/2016 06/26/2016  Falls in the past year? Yes Yes Yes  Number falls in past yr: 2 or more 2 or more 2 or more  Injury with Fall? Yes Yes Yes  Risk Factor Category  High Fall Risk High Fall Risk High Fall Risk  Risk for fall due to : Impaired balance/gait;History of fall(s);Impaired mobility History of fall(s);Impaired balance/gait;Impaired mobility;Mental status change Impaired mobility;Impaired balance/gait;History of fall(s);Mental status change  Follow up Falls prevention discussed;Follow up appointment Falls evaluation completed;Education provided;Falls prevention discussed;Follow up appointment Falls evaluation completed;Falls prevention discussed   PHQ 2/9 Scores 07/08/2016 06/26/2016 04/04/2016  PHQ - 2 Score _0 Assessment: Patient's medications were reviewed.   Gabapentin, Metformin, Topiramate, Carvedilol, Furosemide and Xarelto were in the pill pack.  Esomeprazole, Nortriptyline and Venlafaxine were too early to be put in the pack.  Patient reported sending Januvia, Potassium and Bellsomra to [facility] to have them put into the packs.  Spoke with the Pharmacist Rachel Bo and the Pharmacy Technician, [place] will deliver Januvia and Potassium in a new pill pack to the patient tomorrow. Bellsomra was already in a boxed dispensing form from the manufacturer so the patient will get that back as she provided it.  Patient understands she is to call me by 2pm if she does not receive her medications from [facility].     Plan:  Follow up with the patient about her medications in  3-5 business days.  Elayne Guerin, PharmD, [place] Clinical Pharmacist [phone]

## 2016-09-20 ENCOUNTER — Other Ambulatory Visit: Payer: Self-pay

## 2016-09-20 NOTE — Patient Outreach (Signed)
[place] [facility]) Care Management  09/20/16  [place] [birth year] [id number]  Successful outreach completed with patient. Patient identification verified.  Patient stated that she had heard from [facility] and that she is supposted to go to them Wednesday and pick up the testing supplies for oxygen. She stated that somehow or another, they did not have patient's updated phone number and have been trying to reach her. Patient verbalized that she tried to get transportation, but having trouble finding someone available.  RNCM offered assistance and patient agreeable. RNCM to pick up machine on Thursday and bring it back  She will call them on Monday to let them know that RNCM will be picking up.   Patient voicing frustration over having to pack up her apartment. She stated, "I have to pack up everything - they are coming to redo the inside of my apartment and I also have to take everything out of the cabinets." She stated that they are getting new cabinets, floors, hardware, some appliances, etc. Patient stated that she did get her medicines about 10 minutes before RNCM called. She also stated that they sent her a whole new accucheck (and she has just got one about 4 months ago. Patient has no other questions or concerns and denies any additional needs. Plan: Will follow up with patient on Monday regarding [place].  [name] Raniya Golembeski, RN, BSN, [place] Management Coordinator [phone]

## 2016-09-23 ENCOUNTER — Other Ambulatory Visit: Payer: Self-pay | Admitting: Internal Medicine

## 2016-09-23 ENCOUNTER — Other Ambulatory Visit: Payer: Self-pay | Admitting: Pharmacist

## 2016-09-23 DIAGNOSIS — Z1231 Encounter for screening mammogram for malignant neoplasm of breast: Secondary | ICD-10-CM

## 2016-09-23 NOTE — Patient Outreach (Signed)
[facility]) Care Management  09/23/2016  [place] [birth year] [id number]   Patient was called to follow up on pill packs. HIPAA identifers were obtained. Patient confirmed she received her pill pack from [facility].  Since some of her tablets were left out of the first pack, she has two packs for each week. Patient said she does not like the packs and would prefer to go back to bottles.  [place] was called on the patient's behalf and was asked to take the patient off pill packs but continue to deliver the patient's medications to her home.  Patient said she was just getting up and wanted to call me back later.  Plan: Await a call back from the patient.   Elayne Guerin, PharmD, [place] Clinical Pharmacist [phone]  ADDENDUM  Patient called me back. HIPAA identifiers were obtained. Patient confirmed again that she received her pill packs from [facility].  It was explained to the patient that she would be receiving medication bottles from this point on via delivery to her home.   Elayne Guerin, PharmD, [place] Clinical Pharmacist [phone]

## 2016-09-24 ENCOUNTER — Other Ambulatory Visit: Payer: Self-pay

## 2016-09-24 DIAGNOSIS — M797 Fibromyalgia: Secondary | ICD-10-CM | POA: Diagnosis not present

## 2016-09-24 DIAGNOSIS — I2782 Chronic pulmonary embolism: Secondary | ICD-10-CM | POA: Diagnosis not present

## 2016-09-24 DIAGNOSIS — E1142 Type 2 diabetes mellitus with diabetic polyneuropathy: Secondary | ICD-10-CM | POA: Diagnosis not present

## 2016-09-24 DIAGNOSIS — M069 Rheumatoid arthritis, unspecified: Secondary | ICD-10-CM | POA: Diagnosis not present

## 2016-09-24 DIAGNOSIS — D869 Sarcoidosis, unspecified: Secondary | ICD-10-CM | POA: Diagnosis not present

## 2016-09-24 DIAGNOSIS — I82421 Acute embolism and thrombosis of right iliac vein: Secondary | ICD-10-CM | POA: Diagnosis not present

## 2016-09-24 NOTE — Patient Outreach (Signed)
[name] [facility]) Care Management  09/24/16  [place] 13-Jan-1961 [id number]  Successful outreach completed with patient. Patient identification verified.  Patient stated she is not feeling too well today. She reported that she is feeling kind of weak and is having a flare of her sarcoidosis today. She stated that "my bones hurt today, but I have to get up and do what I have to do." She stated that it is a lot harder on her now that pain medications are not provided as they used to be for her pain.   She stated that she has a lot of appointments coming to see her today. She stated that the nurse is coming today from the agency where she gets the in home aide. She stated that she is going to try to get her to extend her aide for 2-3 days next week so that she can assist her with packing up her closet. She stated that she does not have anyone in her family who will assist her and she is feeling overwhelmed with having to pack up her whole apartment. She stated "When I get overwhelmed, I get anxious and when I get anxious I get a really bad headache."   Patient stated that she meant to call RNCM yesterday about her equipment at [facility]. She was able to talk with them yesterday and provide her bank card information to them over the phone so that [facility] could pick up the machine for her and bring it back to her home for her to use overnight. RNCM can pick up machine tomorrow at [facility] at [address], [place].   Plan: RNCM to pick up overnight oximeter to test patient for possible oxygen needs at home on Wednesday 09/25/2016 and will take it to patient and demonstrate how to use.  [name], RN, BSN, [place] Management Coordinator [phone]

## 2016-09-25 ENCOUNTER — Other Ambulatory Visit: Payer: Self-pay

## 2016-09-25 DIAGNOSIS — R0902 Hypoxemia: Secondary | ICD-10-CM | POA: Diagnosis not present

## 2016-09-25 DIAGNOSIS — J449 Chronic obstructive pulmonary disease, unspecified: Secondary | ICD-10-CM | POA: Diagnosis not present

## 2016-09-25 NOTE — Patient Outreach (Signed)
[place] [facility]) Care Management  09/25/2016  [place] 10-24-1960 [id number]   RNCM picked up patient's pulse oximetry for overnight study and delivered it to patient. RNCM provided education on how to use the equipment and had patient return demonstrate and verbalize understanding of instructions.   RNCM will pick up equipment when patient is finished and return it to [place].  [name] Claritza July, RN, BSN, CCM [facility] Care Management Coordinator [phone]

## 2016-09-26 ENCOUNTER — Encounter: Payer: Self-pay | Admitting: Internal Medicine

## 2016-09-26 DIAGNOSIS — D869 Sarcoidosis, unspecified: Secondary | ICD-10-CM | POA: Diagnosis not present

## 2016-09-26 DIAGNOSIS — M797 Fibromyalgia: Secondary | ICD-10-CM | POA: Diagnosis not present

## 2016-09-26 DIAGNOSIS — E1142 Type 2 diabetes mellitus with diabetic polyneuropathy: Secondary | ICD-10-CM | POA: Diagnosis not present

## 2016-09-26 DIAGNOSIS — M069 Rheumatoid arthritis, unspecified: Secondary | ICD-10-CM | POA: Diagnosis not present

## 2016-09-26 DIAGNOSIS — I82421 Acute embolism and thrombosis of right iliac vein: Secondary | ICD-10-CM | POA: Diagnosis not present

## 2016-09-26 DIAGNOSIS — I2782 Chronic pulmonary embolism: Secondary | ICD-10-CM | POA: Diagnosis not present

## 2016-09-30 ENCOUNTER — Ambulatory Visit (INDEPENDENT_AMBULATORY_CARE_PROVIDER_SITE_OTHER): Payer: Medicare Other | Admitting: Pulmonary Disease

## 2016-09-30 ENCOUNTER — Encounter: Payer: Self-pay | Admitting: Pulmonary Disease

## 2016-09-30 VITALS — BP 118/74 | HR 88 | Ht 67.0 in | Wt 317.4 lb

## 2016-09-30 DIAGNOSIS — I2782 Chronic pulmonary embolism: Secondary | ICD-10-CM | POA: Diagnosis not present

## 2016-09-30 DIAGNOSIS — D869 Sarcoidosis, unspecified: Secondary | ICD-10-CM | POA: Diagnosis not present

## 2016-09-30 DIAGNOSIS — G4733 Obstructive sleep apnea (adult) (pediatric): Secondary | ICD-10-CM | POA: Diagnosis not present

## 2016-09-30 NOTE — Assessment & Plan Note (Signed)
We will send a [facility] for 2 L at all times and have them evaluate you for a portable concentrator

## 2016-09-30 NOTE — Assessment & Plan Note (Signed)
She did not tolerate CPAP therapy in the past and does not want evaluation at present

## 2016-09-30 NOTE — Progress Notes (Signed)
   Subjective:    Patient ID: [name], female    DOB: 1960-08-20, 56 y.o.   MRN: [id number]  HPI  56 year-old obese woman followed for pulmonary & skin  sarcoidosis, lung nodules and provoked(surgery) PE June 2017   03/2016 Started on humira due to recurring cutaneous lesions, not responding to methotrexate ,She is now on Humira every week and prednisone has been tapered to off, skin lesions are improving  Admitted 07/21/2016 for RLE DVT-had stopped Xarelto 04/2016 for prior pulmonary embolism  CT scan = chronic appearing subsegmental PE , is back on Xarelto now Continues to have right lower extremity swelling. Was discharged to [facility] where she was given oxygen and this seemed to help improve her activity levels, was discharged home without oxygen all the prescription was placed by her PCP on 8/10, DME has not provided her oxygen yet  She arrived in the office today with saturation of 81% on room air that improved to 96% on 2 L at rest. Reports increasing dyspnea, denies cough    Significant tests/ events  PFT 05/2014 >> FVC 65%, ratio 69, FEV1 58%, DLCO 62%  VQ Scan 08/17/15 >>LLL &RUL acute PE   ECHO 08/18/15 >>mild LVH , EF 55-60%, Gr 1 DD .   LE Venous Doppler 08/18/15 >>neg DVT , no bakers cyst, ?prominent inguinal lymph node measuring 3.1cm   MRI brain neg Spine Center = MRI spine = mild to moderate disc protrusion/left-sided spinal stenosis-chronic pain  NPSG 12/2004 mild, RDI 13/h  Review of Systems neg for any significant sore throat, dysphagia, itching, sneezing, nasal congestion or excess/ purulent secretions, fever, chills, sweats, unintended wt loss, pleuritic or exertional cp, hempoptysis, orthopnea pnd or change in chronic leg swelling. Also denies presyncope, palpitations, heartburn, abdominal pain, nausea, vomiting, diarrhea or change in bowel or urinary habits, dysuria,hematuria, rash, arthralgias, visual complaints, headache, numbness  weakness or ataxia.     Objective:   Physical Exam   Gen. Pleasant, obese, in no distress ENT - no lesions, no post nasal drip Neck: No JVD, no thyromegaly, no carotid bruits Lungs: no use of accessory muscles, no dullness to percussion, decreased without rales or rhonchi  Cardiovascular: Rhythm regular, heart sounds  normal, no murmurs or gallops, 2+ BL peripheral edema Musculoskeletal: No deformities, no cyanosis or clubbing , no tremors        Assessment & Plan:

## 2016-09-30 NOTE — Assessment & Plan Note (Signed)
Will need lifelong anticoagulation. Consider assessing echo in 6 months to assess pulmonary hypertension

## 2016-09-30 NOTE — Assessment & Plan Note (Signed)
We will enroll you in pulmonary rehabilitation program  On Humira every week for cutaneous sarcoid, pulmonary infiltrates seem minimal

## 2016-09-30 NOTE — Patient Instructions (Signed)
We will send a [facility] for 2 L at all times and have them evaluate you for a portable concentrator  We will enroll you in pulmonary rehabilitation program

## 2016-10-02 ENCOUNTER — Telehealth: Payer: Self-pay | Admitting: Pulmonary Disease

## 2016-10-02 DIAGNOSIS — E1142 Type 2 diabetes mellitus with diabetic polyneuropathy: Secondary | ICD-10-CM | POA: Diagnosis not present

## 2016-10-02 DIAGNOSIS — D869 Sarcoidosis, unspecified: Secondary | ICD-10-CM | POA: Diagnosis not present

## 2016-10-02 DIAGNOSIS — I2782 Chronic pulmonary embolism: Secondary | ICD-10-CM | POA: Diagnosis not present

## 2016-10-02 DIAGNOSIS — M069 Rheumatoid arthritis, unspecified: Secondary | ICD-10-CM | POA: Diagnosis not present

## 2016-10-02 DIAGNOSIS — I82421 Acute embolism and thrombosis of right iliac vein: Secondary | ICD-10-CM | POA: Diagnosis not present

## 2016-10-02 DIAGNOSIS — M797 Fibromyalgia: Secondary | ICD-10-CM | POA: Diagnosis not present

## 2016-10-02 NOTE — Telephone Encounter (Signed)
Spoke with pt. She is aware of VS recommendation. Pt has been scheduled to see JN on 10/03/16 at 4:15pm. Nothing further was needed.

## 2016-10-02 NOTE — Telephone Encounter (Signed)
Patient complaining that since starting oxygen she has been having headaches and sore throat. Patient can be reached at [phone]

## 2016-10-02 NOTE — Telephone Encounter (Signed)
Called and spoke with pt. Pt states since starting on O2 she has developed sore throat and headache. Pt currently on 2L O2 cont. Pt states headache has not subsided with Tylenol.   VS please advise, as RA is unavailable.  Thanks.

## 2016-10-02 NOTE — Telephone Encounter (Signed)
She needs to come in for an assessment.

## 2016-10-03 ENCOUNTER — Ambulatory Visit: Payer: Medicare Other | Admitting: Pulmonary Disease

## 2016-10-04 ENCOUNTER — Other Ambulatory Visit: Payer: Self-pay

## 2016-10-04 ENCOUNTER — Other Ambulatory Visit: Payer: Self-pay | Admitting: Pharmacist

## 2016-10-04 DIAGNOSIS — M069 Rheumatoid arthritis, unspecified: Secondary | ICD-10-CM | POA: Diagnosis not present

## 2016-10-04 DIAGNOSIS — I2782 Chronic pulmonary embolism: Secondary | ICD-10-CM | POA: Diagnosis not present

## 2016-10-04 DIAGNOSIS — M797 Fibromyalgia: Secondary | ICD-10-CM | POA: Diagnosis not present

## 2016-10-04 DIAGNOSIS — I82421 Acute embolism and thrombosis of right iliac vein: Secondary | ICD-10-CM | POA: Diagnosis not present

## 2016-10-04 DIAGNOSIS — D869 Sarcoidosis, unspecified: Secondary | ICD-10-CM | POA: Diagnosis not present

## 2016-10-04 DIAGNOSIS — E1142 Type 2 diabetes mellitus with diabetic polyneuropathy: Secondary | ICD-10-CM | POA: Diagnosis not present

## 2016-10-04 NOTE — Patient Outreach (Signed)
[facility]) Care Management  10/04/16  [place] 07-24-1960  [id number]  Successful outreach completed with patient. Patient identification verified.  Patient stated she has been doing well since last visit. Patient denies any complaints of present. She denies any shortness of breath or chest pain.   Patient stated that she got her oxygen and  is now on oxygen at 2 lpm at all times. Has been complaining of headache and sore throat since starting. Stated that she called her doctor's office and has an appointment on Wednesday with the PA next week to be seen related to this.   Patient stated that she is still working with home health PT. She reported that is going well and he has been helping her to get around apartment and pack it for when will be displaced for renovations.   Patient denies any concerns or needs at present.  Plan: Will continue to follow.  [name] Duell Holdren, RN, BSN, [place] Management Coordinator [phone]

## 2016-10-04 NOTE — Patient Outreach (Addendum)
[place] [facility]) Care Management  10/04/2016  [place] August 22, 1960 [id number]   Called patient to follow up with her. HIPAA identifiers were obtained. Patient confirmed she has been able to manage her medications in the pill packs. However, she does not want to continue to use pill packing. [facility] was already called and asked to send the patient's medication to her home in regular medication bottles versus pill packs.  Patient said she was having issues getting to appointments because she has to call three days in advance to get a ride. She requested [facility] Social Worker, Di Kindle Saporito's phone number which was provided.  Plan:    Follow up with the patient in 2 weeks to be sure her medications will be delivered in medication bottles versus pill packs.   Elayne Guerin, PharmD, [place] Clinical Pharmacist [phone]

## 2016-10-08 DIAGNOSIS — E1142 Type 2 diabetes mellitus with diabetic polyneuropathy: Secondary | ICD-10-CM | POA: Diagnosis not present

## 2016-10-08 DIAGNOSIS — M797 Fibromyalgia: Secondary | ICD-10-CM | POA: Diagnosis not present

## 2016-10-08 DIAGNOSIS — M069 Rheumatoid arthritis, unspecified: Secondary | ICD-10-CM | POA: Diagnosis not present

## 2016-10-08 DIAGNOSIS — D869 Sarcoidosis, unspecified: Secondary | ICD-10-CM | POA: Diagnosis not present

## 2016-10-08 DIAGNOSIS — I2782 Chronic pulmonary embolism: Secondary | ICD-10-CM | POA: Diagnosis not present

## 2016-10-08 DIAGNOSIS — I82421 Acute embolism and thrombosis of right iliac vein: Secondary | ICD-10-CM | POA: Diagnosis not present

## 2016-10-09 ENCOUNTER — Telehealth: Payer: Self-pay | Admitting: Adult Health

## 2016-10-09 ENCOUNTER — Encounter: Payer: Self-pay | Admitting: Adult Health

## 2016-10-09 ENCOUNTER — Ambulatory Visit (INDEPENDENT_AMBULATORY_CARE_PROVIDER_SITE_OTHER): Payer: Medicare Other | Admitting: Adult Health

## 2016-10-09 ENCOUNTER — Other Ambulatory Visit (INDEPENDENT_AMBULATORY_CARE_PROVIDER_SITE_OTHER): Payer: Medicare Other

## 2016-10-09 VITALS — BP 126/78 | HR 105 | Ht 67.0 in | Wt 320.5 lb

## 2016-10-09 DIAGNOSIS — J3089 Other allergic rhinitis: Secondary | ICD-10-CM | POA: Diagnosis not present

## 2016-10-09 DIAGNOSIS — J029 Acute pharyngitis, unspecified: Secondary | ICD-10-CM

## 2016-10-09 LAB — BETA STREP SCREEN: Streptococcus, Group A Screen (Direct): NEGATIVE

## 2016-10-09 NOTE — Patient Instructions (Addendum)
Strep test.  Claritin 10mg  daily for drainage As needed   Saline nasal rinses As needed   Please contact office for sooner follow up if symptoms do not improve or worsen or seek emergency care  Follow up Dr. Elsworth Soho  As planned and As needed

## 2016-10-09 NOTE — Telephone Encounter (Signed)
Will route to [place].

## 2016-10-09 NOTE — Assessment & Plan Note (Signed)
?  flare , check for strep throat  Add saline to help with nasal symptom  claritin As needed   . Plan  Patient Instructions  Strep test.  Claritin 10mg  daily for drainage As needed   Saline nasal rinses As needed   Please contact office for sooner follow up if symptoms do not improve or worsen or seek emergency care  Follow up Dr. Elsworth Soho  As planned and As needed

## 2016-10-09 NOTE — Progress Notes (Signed)
_0  ID: [name], female    DOB: [birth year], 56 y.o.   MRN: [id number]  Chief Complaint  Patient presents with  . Acute Visit    sore throat     Referring provider: Cassandria Anger, MD  HPI: 56 year-old obese woman followed for pulmonary &skin sarcoidosis, lung nodules and provoked(surgery) PE June 2017   03/2016 Started on humiradue to recurring cutaneous lesions, not responding to methotrexate ,She is now on Humira every week and prednisone has been tapered to off, skin lesions are improving  Admitted 07/21/2016 for RLE DVT-had stopped Xarelto 04/2016 for prior pulmonary embolism  CT scan = chronic appearing subsegmental PE , is back on Xarelto now  Significant tests/ events  PFT 05/2014 >> FVC 65%, ratio 69, FEV1 58%, DLCO 62%  VQ Scan 08/17/15 >>LLL &RUL acute PE   ECHO 08/18/15 >>mild LVH , EF 55-60%, Gr 1 DD .   LE Venous Doppler 08/18/15 >>neg DVT , no bakers cyst, ?prominent inguinal lymph node measuring 3.1cm   MRI brain neg [facility] = MRI spine = mild to moderate disc protrusion/left-sided spinal stenosis-chronic pain  NPSG 12/2004 mild, RDI 13/h  10/09/2016 Acute OV : Sore throat  Patient returns for an acute office visit. She complains of a sore throat, nasal stuffiness, drainage and headache since the last several days.. No fever, chest pain , orthopnea or increased edema. No falls or head injury .  Eating good w/ no n/v.d. Feels the oxygen has given her a sore throat .     Allergies  Allergen Reactions  . Enalapril Maleate Cough  . Hydrochlorothiazide Other (See Comments)    Reaction:  Low potassium levels   . Hydroxychloroquine Sulfate Other (See Comments)    Reaction:  Vision changes   . Latex Hives  . Nickel Other (See Comments)    Reaction:  Blisters     Immunization History  Administered Date(s) Administered  . H1N1 12/24/2007  . Influenza Split 10/11/2010  . Influenza Whole 10/14/2007, 10/05/2008,  10/26/2009, 11/13/2011  . Influenza,inj,Quad PF,6+ Mos 09/21/2012, 09/24/2013, 10/06/2014, 09/05/2015  . PPD Test 11/18/2014  . Pneumococcal Conjugate-13 07/30/2010, 12/29/2012  . Pneumococcal Polysaccharide-23 12/23/2013  . Tdap 06/28/2014    Past Medical History:  Diagnosis Date  . Allergic rhinitis   . ALLERGIC RHINITIS 10/26/2009  . Anxiety   . ANXIETY 08/14/2006  . B12 DEFICIENCY 08/25/2007  . Confusion   . Depression    takes Lexapro daily  . Diabetes mellitus without complication ([place])    was on insulin but has been off since Nov 2015 and now only takes Metformin daily  . DYSPNEA 04/28/2009   with exertion  . Esophageal reflux    takes Nexium daily  . Fibromyalgia   . Headache    last migraine 2-29yr ago;takes Topamax daily  . History of shingles   . Hypertension    takes Coreg daily  . Insomnia    takes Nortriptyline nightly   . Joint pain   . Joint swelling   . Left knee pain   . Lichen planus   . Long-term memory loss   . Nocturia   . OSA (obstructive sleep apnea)    doesn't use CPAP;sleep study in epic from 2006  . Osteoarthritis   . Osteoarthritis   . Pneumonia    over 30 yrs ago  . Protein calorie malnutrition ([place]   . Rheumatoid arthritis ([place]   . Sarcoidosis    Dr. ZLolita Patella . Short-term memory  loss   . Shortness of breath   . TIA (transient ischemic attack)   . Unsteady gait   . Urinary urgency   . Vitamin D deficiency    is supposed to take Vit D but can't afford it  . VITAMIN D DEFICIENCY 08/25/2007    Tobacco History: History  Smoking Status  . Former Smoker  . Packs/day: 0.50  . Years: 10.00  . Types: Cigarettes  Smokeless Tobacco  . Never Used    Comment: quit smoking in 2004   Counseling given: Not Answered   Outpatient Encounter Prescriptions as of 10/09/2016  Medication Sig  . ACCU-CHEK SOFTCLIX LANCETS lancets 1 each by Other route 2 (two) times daily. Use to check blood sugars twice a day  . Adalimumab (HUMIRA) 40  MG/0.8ML PSKT Inject 40 mg into the skin every 14 (fourteen) days.  [name] albuterol (PROVENTIL HFA;VENTOLIN HFA) 108 (90 Base) MCG/ACT inhaler Inhale 2 puffs into the lungs every 6 (six) hours as needed for wheezing or shortness of breath.  . Blood Glucose Monitoring Suppl (ACCU-CHEK AVIVA PLUS) w/Device KIT Use as directed to check blood sugars  . carvedilol (COREG) 12.5 MG tablet TAKE 1 TABLET BY MOUTH TWICE DAILY WITH A MEAL  . chlorhexidine (PERIDEX) 0.12 % solution Use as directed 5 mLs in the mouth or throat 2 (two) times daily. Pt is to swish and spit.  [name] diclofenac sodium (VOLTAREN) 1 % GEL Apply 2 g topically 2 (two) times daily as needed (for pain).  [name] esomeprazole (NEXIUM) 40 MG capsule Take 40 mg by mouth daily before breakfast.   . fluocinonide gel (LIDEX) 9.20 % Apply 1 application topically 2 (two) times daily as needed (for sores on gums).   . furosemide (LASIX) 40 MG tablet TAKE 1 TABLET BY MOUTH EVERY MORNING FOR EDEMA  . gabapentin (NEURONTIN) 300 MG capsule Take 1 capsule (300 mg total) by mouth 2 (two) times daily as needed (for pain).  [name] glucose blood (ACCU-CHEK AVIVA PLUS) test strip 1 each by Other route 2 (two) times daily. Use to check blood sugars twice a day  . lidocaine (XYLOCAINE) 2 % solution Use as directed 20 mLs in the mouth or throat 3 (three) times daily as needed for mouth pain. Pt is to swish and spit.  . metFORMIN (GLUCOPHAGE-XR) 500 MG 24 hr tablet TAKE 2 TABLETS(1000 MG) BY MOUTH DAILY WITH BREAKFAST  . nortriptyline (PAMELOR) 75 MG capsule Take 1 capsule (75 mg total) by mouth at bedtime.  . potassium chloride (KLOR-CON 10) 10 MEQ tablet Take 1 tablet (10 mEq total) by mouth daily.  . sitaGLIPtin (JANUVIA) 50 MG tablet Take 1 tablet (50 mg total) by mouth daily.  . Suvorexant (BELSOMRA) 20 MG TABS Take 1 tablet by mouth at bedtime.  . topiramate (TOPAMAX) 50 MG tablet Take 1 tablet (50 mg total) by mouth 2 (two) times daily.  . traMADol (ULTRAM) 50 MG tablet  Take 2 tablets (100 mg total) by mouth every 6 (six) hours as needed for moderate pain.  [name] venlafaxine XR (EFFEXOR XR) 75 MG 24 hr capsule Take 1 capsule (75 mg total) by mouth daily with breakfast.  . XARELTO 20 MG TABS tablet Take 1 tablet (20 mg total) by mouth daily. with food   No facility-administered encounter medications on file as of 10/09/2016.      Review of Systems  Constitutional:   No  weight loss, night sweats,  Fevers, chills, fatigue, or  lassitude.  HEENT:  No headaches,  Difficulty swallowing,  Tooth/dental problems, or   +Sore throat,                No sneezing, itching, ear ache+ nasal congestion, post nasal drip,   CV:  No chest pain,  Orthopnea, PND, swelling in lower extremities, anasarca, dizziness, palpitations, syncope.   GI  No heartburn, indigestion, abdominal pain, nausea, vomiting, diarrhea, change in bowel habits, loss of appetite, bloody stools.   Resp:   No chest wall deformity  Skin: no rash or lesions.  GU: no dysuria, change in color of urine, no urgency or frequency.  No flank pain, no hematuria   MS:  No joint pain or swelling.  No decreased range of motion.  No back pain.    Physical Exam  BP 126/78 (BP Location: Right Arm, Cuff Size: Normal)   Pulse (!) 105   Ht _0  (1.702 m)   Wt (!) 320 lb 8 oz (145.4 kg)   SpO2 91%   BMI 50.20 kg/m   GEN: A/Ox3; pleasant , NAD, obese on O2    HEENT:  [place]/AT,  EACs-clear, TMs-wnl, NOSE-clear drainage , THROAT-clear, mild redness, no lesions, no postnasal drip or exudate noted.   NECK:  Supple w/ fair ROM; no JVD; normal carotid impulses w/o bruits; no thyromegaly or nodules palpated; no lymphadenopathy.    RESP  Clear  P & A; w/o, wheezes/ rales/ or rhonchi. no accessory muscle use, no dullness to percussion  CARD:  RRR, no m/r/g, tr  peripheral edema, pulses intact, no cyanosis or clubbing.  GI:   Soft & nt; nml bowel sounds; no organomegaly or masses detected.   Musco: Warm bil, no  deformities or joint swelling noted.   Neuro: alert, no focal deficits noted.    Skin: Warm, no lesions or rashes    Lab Results:  CBC  BMET  BNP No results found for: BNP Imaging: No results found.   Assessment & Plan:   Allergic rhinitis ?flare , check for strep throat  Add saline to help with nasal symptom  claritin As needed   . Plan  Patient Instructions  Strep test.  Claritin 57m daily for drainage As needed   Saline nasal rinses As needed   Please contact office for sooner follow up if symptoms do not improve or worsen or seek emergency care  Follow up Dr. AElsworth Soho As planned and As needed         TRexene Edison NP 10/09/2016

## 2016-10-09 NOTE — Telephone Encounter (Signed)
Had called pt to discuss results See chart  Beta strep is negative Pt is aware  Nothing further needed; will sign off

## 2016-10-10 ENCOUNTER — Other Ambulatory Visit: Payer: Self-pay

## 2016-10-10 DIAGNOSIS — I2782 Chronic pulmonary embolism: Secondary | ICD-10-CM | POA: Diagnosis not present

## 2016-10-10 DIAGNOSIS — M797 Fibromyalgia: Secondary | ICD-10-CM | POA: Diagnosis not present

## 2016-10-10 DIAGNOSIS — I82421 Acute embolism and thrombosis of right iliac vein: Secondary | ICD-10-CM | POA: Diagnosis not present

## 2016-10-10 DIAGNOSIS — E1142 Type 2 diabetes mellitus with diabetic polyneuropathy: Secondary | ICD-10-CM | POA: Diagnosis not present

## 2016-10-10 DIAGNOSIS — D869 Sarcoidosis, unspecified: Secondary | ICD-10-CM | POA: Diagnosis not present

## 2016-10-10 DIAGNOSIS — M069 Rheumatoid arthritis, unspecified: Secondary | ICD-10-CM | POA: Diagnosis not present

## 2016-10-10 MED ORDER — ESOMEPRAZOLE MAGNESIUM 40 MG PO CPDR: 40.0000 mg | DELAYED_RELEASE_CAPSULE | Freq: Every day | ORAL | 1 refills | Status: DC

## 2016-10-14 ENCOUNTER — Ambulatory Visit: Payer: Self-pay

## 2016-10-15 ENCOUNTER — Other Ambulatory Visit: Payer: Self-pay | Admitting: Pharmacist

## 2016-10-15 DIAGNOSIS — M797 Fibromyalgia: Secondary | ICD-10-CM | POA: Diagnosis not present

## 2016-10-15 DIAGNOSIS — D869 Sarcoidosis, unspecified: Secondary | ICD-10-CM | POA: Diagnosis not present

## 2016-10-15 DIAGNOSIS — I82421 Acute embolism and thrombosis of right iliac vein: Secondary | ICD-10-CM | POA: Diagnosis not present

## 2016-10-15 DIAGNOSIS — E1142 Type 2 diabetes mellitus with diabetic polyneuropathy: Secondary | ICD-10-CM | POA: Diagnosis not present

## 2016-10-15 DIAGNOSIS — I2782 Chronic pulmonary embolism: Secondary | ICD-10-CM | POA: Diagnosis not present

## 2016-10-15 DIAGNOSIS — M069 Rheumatoid arthritis, unspecified: Secondary | ICD-10-CM | POA: Diagnosis not present

## 2016-10-15 NOTE — Patient Outreach (Signed)
[facility]) Care Management  10/15/2016  [name] 04-14-1960 [id number]  Patient called concerned about her medications because her apartment is being renovated and she is going to have to move to temporary housing. HIPAA identifiers were obtained. [facility] was called on the patient's behalf. According to the pharmacist, Houng, the patient's medications will be delivered to her tomorrow.  Plan: Follow up with the patient via phone by the end of the week to ensure delivery.   [name], PharmD, [place] Clinical Pharmacist [phone]

## 2016-10-17 ENCOUNTER — Telehealth: Payer: Self-pay | Admitting: Internal Medicine

## 2016-10-17 NOTE — Telephone Encounter (Signed)
[name] from [facility] called requesting to extend home health PT orders two times a week for 8 weeks beginning on October 8th.  Messages can be left on his voicemail.

## 2016-10-17 NOTE — Telephone Encounter (Signed)
Notified Mark w/MD response...[name]

## 2016-10-17 NOTE — Telephone Encounter (Signed)
OK. Thx

## 2016-10-18 ENCOUNTER — Other Ambulatory Visit: Payer: Self-pay | Admitting: Pharmacist

## 2016-10-18 DIAGNOSIS — E1142 Type 2 diabetes mellitus with diabetic polyneuropathy: Secondary | ICD-10-CM | POA: Diagnosis not present

## 2016-10-18 DIAGNOSIS — M069 Rheumatoid arthritis, unspecified: Secondary | ICD-10-CM | POA: Diagnosis not present

## 2016-10-18 DIAGNOSIS — I82421 Acute embolism and thrombosis of right iliac vein: Secondary | ICD-10-CM | POA: Diagnosis not present

## 2016-10-18 DIAGNOSIS — M797 Fibromyalgia: Secondary | ICD-10-CM | POA: Diagnosis not present

## 2016-10-18 DIAGNOSIS — I2782 Chronic pulmonary embolism: Secondary | ICD-10-CM | POA: Diagnosis not present

## 2016-10-18 DIAGNOSIS — D869 Sarcoidosis, unspecified: Secondary | ICD-10-CM | POA: Diagnosis not present

## 2016-10-18 NOTE — Patient Outreach (Signed)
[facility]) Care Management  10/18/2016  [place] 23-Jul-1960 [id number]   Called patient to check on the delivery of her medications from [facility].  HIPAA identifiers were obtained. Patient confirmed she received her medications from [facility].  Patient also stated the move her apartment has been postponed for 2 weeks. Patient said her apartment is a mess and is mostly packed up.  Patient wondered if she could get on SCAT. She will be referred back to social work.  Plan:  Call patient back in 10-14 days to check on her. Route note to [place], [place] to inquire about SCAT

## 2016-10-21 ENCOUNTER — Ambulatory Visit: Payer: Medicare Other | Admitting: Internal Medicine

## 2016-10-21 ENCOUNTER — Encounter: Payer: Self-pay | Admitting: *Deleted

## 2016-10-21 DIAGNOSIS — I2782 Chronic pulmonary embolism: Secondary | ICD-10-CM | POA: Diagnosis not present

## 2016-10-21 DIAGNOSIS — D869 Sarcoidosis, unspecified: Secondary | ICD-10-CM | POA: Diagnosis not present

## 2016-10-21 DIAGNOSIS — E1142 Type 2 diabetes mellitus with diabetic polyneuropathy: Secondary | ICD-10-CM | POA: Diagnosis not present

## 2016-10-21 DIAGNOSIS — M797 Fibromyalgia: Secondary | ICD-10-CM | POA: Diagnosis not present

## 2016-10-21 DIAGNOSIS — I82421 Acute embolism and thrombosis of right iliac vein: Secondary | ICD-10-CM | POA: Diagnosis not present

## 2016-10-21 DIAGNOSIS — M069 Rheumatoid arthritis, unspecified: Secondary | ICD-10-CM | POA: Diagnosis not present

## 2016-10-23 DIAGNOSIS — D869 Sarcoidosis, unspecified: Secondary | ICD-10-CM | POA: Diagnosis not present

## 2016-10-23 DIAGNOSIS — I2782 Chronic pulmonary embolism: Secondary | ICD-10-CM | POA: Diagnosis not present

## 2016-10-23 DIAGNOSIS — I82421 Acute embolism and thrombosis of right iliac vein: Secondary | ICD-10-CM | POA: Diagnosis not present

## 2016-10-23 DIAGNOSIS — M069 Rheumatoid arthritis, unspecified: Secondary | ICD-10-CM | POA: Diagnosis not present

## 2016-10-23 DIAGNOSIS — M797 Fibromyalgia: Secondary | ICD-10-CM | POA: Diagnosis not present

## 2016-10-23 DIAGNOSIS — E1142 Type 2 diabetes mellitus with diabetic polyneuropathy: Secondary | ICD-10-CM | POA: Diagnosis not present

## 2016-10-24 ENCOUNTER — Encounter: Payer: Self-pay | Admitting: *Deleted

## 2016-10-24 ENCOUNTER — Other Ambulatory Visit: Payer: Self-pay | Admitting: *Deleted

## 2016-10-24 NOTE — Patient Outreach (Signed)
[facility]) Care Management  10/24/2016  [name] September 24, 1960 [id number]   CSW was able to make initial contact with patient today to perform phone assessment, as well as assess and assist with social work needs and services.  CSW introduced self, explained role and types of services provided through [facility] [employer] ([facility]).  CSW further explained to patient that CSW works with patient's RNCM, also with [facility] [employer], Tish Men. CSW then explained the reason for the call, indicating that [name] thought that patient would benefit from social work services and resources to assist patient with applying for [employer] Par[employer]) through [employer]  CSW obtained two HIPAA compliant identifiers from patient, which included patient's name and date of birth. Patient admitted that she has reconsidered her options for transportation and is now interested in receiving public transportation services through [place].  CSW explained to patient that CSW has completed a SCAT application on patient's behalf and submitted directly to [facility] for processing.  Further, CSW was able to make contact with a representative from [employer] to ensure that the application was received via fax.  The representative explained to [place] that someone would be contacting patient directly to schedule a phone assessment.  CSW explained all of this information to patient, encouraging her to be available by phone within the next week to complete the initial phone assessment.  Patient voiced understanding and was agreeable to this plan. CSW will perform a case closure on patient, as all goals of treatment have been met from social work standpoint and no additional social work needs have been identified at this time.  CSW will notify patient's RNCM with [facility], Tish Men  of CSW's plans to close patient's case.  CSW will fax an update to patient's Primary Care Physician, Dr. [name] Plotnikov to ensure that they are aware of CSW's involvement with patient's plan of care.  CSW will submit a case closure request to Verlon Setting, Care Management Assistant with [facility] [employer], in the form of an [employer].  CSW will ensure that [name] is aware of [name]'s, RNCM with [employer], continued involvement with patient's care. [name], BSW, MSW, LCSW  Licensed Education officer, [facility]  [place] [address], [place], [place] [address] Physical Address-[address], [place], [place] [address] Toll Free Main # [phone] Fax # [phone] Cell # [phone]  Office # [phone] [email or web address]

## 2016-10-28 DIAGNOSIS — M797 Fibromyalgia: Secondary | ICD-10-CM | POA: Diagnosis not present

## 2016-10-28 DIAGNOSIS — I82421 Acute embolism and thrombosis of right iliac vein: Secondary | ICD-10-CM | POA: Diagnosis not present

## 2016-10-28 DIAGNOSIS — M069 Rheumatoid arthritis, unspecified: Secondary | ICD-10-CM | POA: Diagnosis not present

## 2016-10-28 DIAGNOSIS — D869 Sarcoidosis, unspecified: Secondary | ICD-10-CM | POA: Diagnosis not present

## 2016-10-28 DIAGNOSIS — I2782 Chronic pulmonary embolism: Secondary | ICD-10-CM | POA: Diagnosis not present

## 2016-10-28 DIAGNOSIS — E1142 Type 2 diabetes mellitus with diabetic polyneuropathy: Secondary | ICD-10-CM | POA: Diagnosis not present

## 2016-10-29 ENCOUNTER — Telehealth (HOSPITAL_COMMUNITY): Payer: Self-pay

## 2016-10-29 NOTE — Telephone Encounter (Signed)
Attempted to call patient in regards to Pulmonary Rehab. LMTCB °

## 2016-10-31 DIAGNOSIS — M069 Rheumatoid arthritis, unspecified: Secondary | ICD-10-CM | POA: Diagnosis not present

## 2016-10-31 DIAGNOSIS — D869 Sarcoidosis, unspecified: Secondary | ICD-10-CM | POA: Diagnosis not present

## 2016-10-31 DIAGNOSIS — I82421 Acute embolism and thrombosis of right iliac vein: Secondary | ICD-10-CM | POA: Diagnosis not present

## 2016-10-31 DIAGNOSIS — M797 Fibromyalgia: Secondary | ICD-10-CM | POA: Diagnosis not present

## 2016-10-31 DIAGNOSIS — I2782 Chronic pulmonary embolism: Secondary | ICD-10-CM | POA: Diagnosis not present

## 2016-10-31 DIAGNOSIS — E1142 Type 2 diabetes mellitus with diabetic polyneuropathy: Secondary | ICD-10-CM | POA: Diagnosis not present

## 2016-11-01 ENCOUNTER — Ambulatory Visit: Payer: Self-pay

## 2016-11-01 ENCOUNTER — Other Ambulatory Visit: Payer: Self-pay

## 2016-11-01 ENCOUNTER — Ambulatory Visit: Payer: Medicare Other

## 2016-11-05 ENCOUNTER — Ambulatory Visit: Payer: Medicare Other

## 2016-11-05 DIAGNOSIS — M069 Rheumatoid arthritis, unspecified: Secondary | ICD-10-CM | POA: Diagnosis not present

## 2016-11-05 DIAGNOSIS — Z7984 Long term (current) use of oral hypoglycemic drugs: Secondary | ICD-10-CM

## 2016-11-05 DIAGNOSIS — I82421 Acute embolism and thrombosis of right iliac vein: Secondary | ICD-10-CM | POA: Diagnosis not present

## 2016-11-05 DIAGNOSIS — Z9981 Dependence on supplemental oxygen: Secondary | ICD-10-CM

## 2016-11-05 DIAGNOSIS — I10 Essential (primary) hypertension: Secondary | ICD-10-CM | POA: Diagnosis not present

## 2016-11-05 DIAGNOSIS — Z6841 Body Mass Index (BMI) 40.0 and over, adult: Secondary | ICD-10-CM

## 2016-11-05 DIAGNOSIS — Z96653 Presence of artificial knee joint, bilateral: Secondary | ICD-10-CM

## 2016-11-05 DIAGNOSIS — Z7952 Long term (current) use of systemic steroids: Secondary | ICD-10-CM

## 2016-11-05 DIAGNOSIS — M797 Fibromyalgia: Secondary | ICD-10-CM | POA: Diagnosis not present

## 2016-11-05 DIAGNOSIS — I2782 Chronic pulmonary embolism: Secondary | ICD-10-CM | POA: Diagnosis not present

## 2016-11-05 DIAGNOSIS — I2510 Atherosclerotic heart disease of native coronary artery without angina pectoris: Secondary | ICD-10-CM | POA: Diagnosis not present

## 2016-11-05 DIAGNOSIS — Z9181 History of falling: Secondary | ICD-10-CM

## 2016-11-05 DIAGNOSIS — D869 Sarcoidosis, unspecified: Secondary | ICD-10-CM | POA: Diagnosis not present

## 2016-11-05 DIAGNOSIS — E1142 Type 2 diabetes mellitus with diabetic polyneuropathy: Secondary | ICD-10-CM | POA: Diagnosis not present

## 2016-11-05 DIAGNOSIS — Z7901 Long term (current) use of anticoagulants: Secondary | ICD-10-CM

## 2016-11-05 DIAGNOSIS — E46 Unspecified protein-calorie malnutrition: Secondary | ICD-10-CM | POA: Diagnosis not present

## 2016-11-05 DIAGNOSIS — F319 Bipolar disorder, unspecified: Secondary | ICD-10-CM | POA: Diagnosis not present

## 2016-11-05 DIAGNOSIS — F410 Panic disorder [episodic paroxysmal anxiety] without agoraphobia: Secondary | ICD-10-CM | POA: Diagnosis not present

## 2016-11-05 NOTE — Progress Notes (Signed)
Pre visit review using our clinic review tool, if applicable. No additional management support is needed unless otherwise documented below in the visit note. 

## 2016-11-05 NOTE — Progress Notes (Addendum)
Subjective:   [name] is a 56 y.o. female who presents for an Initial Medicare Annual Wellness Visit.  Review of Systems    No ROS.  Medicare Wellness Visit. Additional risk factors are reflected in the social history.   Cardiac Risk Factors include: advanced age (>46mn, >>21women);diabetes mellitus;dyslipidemia;hypertension;obesity (BMI >30kg/m2);sedentary lifestyle  Sleep patterns: has interrupted sleep, gets up 2-3 times nightly to void and sleeps 5-6 hours nightly.  Patient reports insomnia issues, discussed recommended sleep tips and stress reduction tips.   Home Safety/Smoke Alarms: Feels safe in home. Smoke alarms in place.  Living environment; residence and FAdult nurse apartment, equipment: Cane, [id number] [place] Type: RConservation officer, nature [place] Type: Tub Seat/Chair and BSC, no firearms. Patient lives in a handi-capped apartment. Lives aline, no needs for DME, limited support system Seat Belt Safety/Bike Helmet: Wears seat belt.     Objective:    Today's Vitals   11/06/16 1004  BP: 132/76  Pulse: 77  Resp: (!) 22  SpO2: 98%  Weight: (!) 322 lb (146.1 kg)  Height: _0  (1.702 m)  PainSc: 5    Body mass index is 50.43 kg/m.   Current Medications (verified) Outpatient Encounter Prescriptions as of 11/06/2016  Medication Sig  . ACCU-CHEK SOFTCLIX LANCETS lancets 1 each by Other route 2 (two) times daily. Use to check blood sugars twice a day  . Adalimumab (HUMIRA) 40 MG/0.8ML PSKT Inject 40 mg into the skin every 14 (fourteen) days.  .Marland Kitchenalbuterol (PROVENTIL HFA;VENTOLIN HFA) 108 (90 Base) MCG/ACT inhaler Inhale 2 puffs into the lungs every 6 (six) hours as needed for wheezing or shortness of breath.  . Blood Glucose Monitoring Suppl (ACCU-CHEK AVIVA PLUS) w/Device KIT Use as directed to check blood sugars  . carvedilol (COREG) 12.5 MG tablet TAKE 1 TABLET BY MOUTH TWICE DAILY WITH A MEAL  . chlorhexidine (PERIDEX) 0.12 %  solution Use as directed 5 mLs in the mouth or throat 2 (two) times daily. Pt is to swish and spit.  .Marland Kitchendiclofenac sodium (VOLTAREN) 1 % GEL Apply 2 g topically 2 (two) times daily as needed (for pain).  .Marland Kitchenesomeprazole (NEXIUM) 40 MG capsule Take 1 capsule (40 mg total) by mouth daily before breakfast.  . furosemide (LASIX) 40 MG tablet TAKE 1 TABLET BY MOUTH EVERY MORNING FOR EDEMA  . glucose blood (ACCU-CHEK AVIVA PLUS) test strip 1 each by Other route 2 (two) times daily. Use to check blood sugars twice a day  . lidocaine (XYLOCAINE) 2 % solution Use as directed 20 mLs in the mouth or throat 3 (three) times daily as needed for mouth pain. Pt is to swish and spit.  . metFORMIN (GLUCOPHAGE-XR) 500 MG 24 hr tablet TAKE 2 TABLETS(1000 MG) BY MOUTH DAILY WITH BREAKFAST  . nortriptyline (PAMELOR) 75 MG capsule Take 1 capsule (75 mg total) by mouth at bedtime.  . potassium chloride (KLOR-CON 10) 10 MEQ tablet Take 1 tablet (10 mEq total) by mouth daily.  . sitaGLIPtin (JANUVIA) 50 MG tablet Take 1 tablet (50 mg total) by mouth daily.  . Suvorexant (BELSOMRA) 20 MG TABS Take 1 tablet by mouth at bedtime.  . topiramate (TOPAMAX) 50 MG tablet Take 1 tablet (50 mg total) by mouth 2 (two) times daily.  .Marland Kitchenvenlafaxine XR (EFFEXOR XR) 75 MG 24 hr capsule Take 1 capsule (75 mg total) by mouth daily with breakfast.  . XARELTO 20 MG TABS tablet Take 1 tablet (20 mg total) by  mouth daily. with food  . gabapentin (NEURONTIN) 300 MG capsule Take 1 capsule (300 mg total) by mouth 2 (two) times daily as needed (for pain).  . [DISCONTINUED] fluocinonide gel (LIDEX) 1.61 % Apply 1 application topically 2 (two) times daily as needed (for sores on gums).   . [DISCONTINUED] traMADol (ULTRAM) 50 MG tablet Take 2 tablets (100 mg total) by mouth every 6 (six) hours as needed for moderate pain. (Patient not taking: Reported on 11/06/2016)   Facility-Administered Encounter Medications as of 11/06/2016  Medication  .  cyanocobalamin ((VITAMIN B-12)) injection 1,000 mcg    Allergies (verified) Enalapril maleate; Hydrochlorothiazide; Hydroxychloroquine sulfate; Latex; and Nickel   History: Past Medical History:  Diagnosis Date  . Allergic rhinitis   . ALLERGIC RHINITIS 10/26/2009  . Anxiety   . ANXIETY 08/14/2006  . B12 DEFICIENCY 08/25/2007  . Confusion   . Depression    takes Lexapro daily  . Diabetes mellitus without complication ([place])    was on insulin but has been off since Nov 2015 and now only takes Metformin daily  . DYSPNEA 04/28/2009   with exertion  . Esophageal reflux    takes Nexium daily  . Fibromyalgia   . Headache    last migraine 2-64yr ago;takes Topamax daily  . History of shingles   . Hypertension    takes Coreg daily  . Insomnia    takes Nortriptyline nightly   . Joint pain   . Joint swelling   . Left knee pain   . Lichen planus   . Long-term memory loss   . Nocturia   . OSA (obstructive sleep apnea)    doesn't use CPAP;sleep study in epic from 2006  . Osteoarthritis   . Osteoarthritis   . Pneumonia    over 30 yrs ago  . Protein calorie malnutrition ([place]   . Rheumatoid arthritis ([place]   . Sarcoidosis    Dr. ZLolita Patella . Short-term memory loss   . Shortness of breath   . TIA (transient ischemic attack)   . Unsteady gait   . Urinary urgency   . Vitamin D deficiency    is supposed to take Vit D but can't afford it  . VITAMIN D DEFICIENCY 08/25/2007   Past Surgical History:  Procedure Laterality Date  . APPENDECTOMY    . arthroscopic knee surgery Right 11-12-04  . AXILLARY ABCESS IRRIGATION AND DEBRIDEMENT  Jul & Aug2012  . CARPAL TUNNEL RELEASE Left 05/23/2014   Procedure: CARPAL TUNNEL RELEASE;  Surgeon: SMeredith Pel MD;  [place] [facility]  Service: Orthopedics;  Laterality: Left;  . cyst removed from top of buttocks  at age [age] . ENDOMETRIAL ABLATION    . TOTAL KNEE ARTHROPLASTY Right 11/15/2014   Procedure: TOTAL RIGHT KNEE ARTHROPLASTY;  Surgeon:  SMeredith Pel MD;  [place] [facility]  Service: Orthopedics;  Laterality: Right;  . TOTAL KNEE ARTHROPLASTY Left 07/13/2015   Procedure: LEFT TOTAL KNEE ARTHROPLASTY;  Surgeon: SMeredith Pel MD;  [facility] [facility]  Service: Orthopedics;  Laterality: Left;   Family History  Problem Relation Age of Onset  . Heart disease Mother   . Kidney disease Mother   . Cancer Father        leukemia  . Hypertension Unknown   . Coronary artery disease Unknown        female 1st degree relative <60  . Heart failure Unknown        congestive  . Coronary artery disease Unknown  Female 1st degree relative <50  . Breast cancer Unknown        1st degree relative <50 S   Social History   Occupational History  . disabled    Social History Main Topics  . Smoking status: Former Smoker    Packs/day: 0.50    Years: 10.00    Types: Cigarettes  . Smokeless tobacco: Never Used     Comment: quit smoking in 2004  . Alcohol use No  . Drug use: No  . Sexual activity: Not Currently    Tobacco Counseling Counseling given: Not Answered   Activities of Daily Living In your present state of health, do you have any difficulty performing the following activities: 11/06/2016 10/24/2016  Hearing? N N  Vision? Y N  Comment - -  Difficulty concentrating or making decisions? Y N  Comment - -  Walking or climbing stairs? Y Y  Dressing or bathing? N N  Doing errands, shopping? [name]  Preparing Food and eating ? N N  Using the Toilet? N N  In the past six months, have you accidently leaked urine? N Y  Do you have problems with loss of bowel control? N N  Managing your Medications? N N  Managing your Finances? N N  Housekeeping or managing your Housekeeping? [name]  Some recent data might be hidden    Immunizations and Health Maintenance Immunization History  Administered Date(s) Administered  . H1N1 12/24/2007  . Influenza Split 10/11/2010  . Influenza Whole 10/14/2007, 10/05/2008, 10/26/2009,  11/13/2011  . Influenza,inj,Quad PF,6+ Mos 09/21/2012, 09/24/2013, 10/06/2014, 09/05/2015  . PPD Test 11/18/2014  . Pneumococcal Conjugate-13 07/30/2010, 12/29/2012  . Pneumococcal Polysaccharide-23 12/23/2013  . Tdap 06/28/2014   Health Maintenance Due  Topic Date Due  . Hepatitis C Screening  03-29-1960  . PAP SMEAR  07/26/1981  . COLONOSCOPY  07/27/2010  . OPHTHALMOLOGY EXAM  10/13/2015  . URINE MICROALBUMIN  02/01/2016  . INFLUENZA VACCINE  08/14/2016    Patient Care Team: Cassandria Anger, MD as PCP - Huston Foley, MD as Consulting Physician (General Surgery) Renato Shin, MD as Attending Physician (Internal Medicine) Marlou Sa Tonna Corner, MD (Orthopedic Surgery) Bo Merino, MD as Consulting Physician (Rheumatology) Dian Situ, MD (Pain Medicine) Bo Merino, MD as Consulting Physician (Rheumatology) Rolm Bookbinder, MD as Consulting Physician (Dermatology) Rigoberto Noel, MD as Consulting Physician (Pulmonary Disease) Elayne Guerin, [facility] as [place] Management (Pharmacist) Elayne Guerin, [facility] as [place] Management (Pharmacist) Loletta Specter, RN as [place] Management Josue Hector, MD as Consulting Physician (Cardiology) Enriqueta Shutter, MD as Referring Physician (Dermatology)  Indicate any recent Medical Services you may have received from other than [facility] providers in the past year (date may be approximate).     Assessment:   This is a routine wellness examination for [place]. Physical assessment deferred to PCP.   Hearing/Vision screen Hearing Screening Comments: Able to hear conversational tones w/o difficulty. No issues reported.  Passed whisper test Vision Screening Comments: Patient requested ophthalmology referral   Dietary issues and exercise activities discussed: Current Exercise Habits: Structured exercise class (chair exercise pamphlets provided),  Intensity: Mild, Exercise limited by: orthopedic condition(s) (obesity)  Diet (meal preparation, eat out, water intake, caffeinated beverages, dairy products, fruits and vegetables): in general, an "unhealthy" diet, on average, 2 meals per day   Reviewed heart healthy and diabetic diet, encouraged patient to increase daily water intake. Diet education was attached  to patient's AVS. Relevant patient education assigned to patient using Emmi.  Goals    . Stay as independent as possible          Continue to work with physical therapy, go to pulmonary physical therapy, and do some chair exercises to get me stronger and more mobile.      Depression Screen PHQ 2/9 Scores 11/06/2016 10/24/2016 07/08/2016 06/26/2016 04/04/2016  PHQ - 2 Score 2 0 _0 PHQ- 9 Score 7 - - - -    Fall Risk Fall Risk  11/06/2016 10/24/2016 08/22/2016 07/08/2016 06/26/2016  Falls in the past year? Yes No Yes Yes Yes  Number falls in past yr: 2 or more - 2 or more 2 or more 2 or more  Injury with Fall? - - Yes Yes Yes  Risk Factor Category  - - High Fall Risk High Fall Risk High Fall Risk  Risk for fall due to : - Impaired balance/gait;Impaired mobility Impaired balance/gait;History of fall(s);Impaired mobility History of fall(s);Impaired balance/gait;Impaired mobility;Mental status change Impaired mobility;Impaired balance/gait;History of fall(s);Mental status change  Follow up Falls prevention discussed;Education provided - Falls prevention discussed;Follow up appointment Falls evaluation completed;Education provided;Falls prevention discussed;Follow up appointment Falls evaluation completed;Falls prevention discussed    Cognitive Function: MMSE - Mini Mental State Exam 11/06/2016  Orientation to time 5  Orientation to Place 5  Registration 3  Attention/ Calculation 3  Recall 1  Language- name 2 objects 2  Language- repeat 1  Language- follow 3 step command 3  Language- read & follow direction 1  Write a sentence  1  Copy design 1  Total score 26        Screening Tests Health Maintenance  Topic Date Due  . Hepatitis C Screening  11-25-1960  . PAP SMEAR  07/26/1981  . COLONOSCOPY  07/27/2010  . OPHTHALMOLOGY EXAM  10/13/2015  . URINE MICROALBUMIN  02/01/2016  . INFLUENZA VACCINE  08/14/2016  . HEMOGLOBIN A1C  01/22/2017  . FOOT EXAM  08/09/2017  . MAMMOGRAM  10/30/2017  . PNEUMOCOCCAL POLYSACCHARIDE VACCINE (2) 12/24/2018  . TETANUS/TDAP  06/27/2024  . HIV Screening  Completed      Plan:     Patient declined colonoscopy referral. Cologuard information provided, patient will call for an order if she decides she would like to do Cologuard screening.   Start doing brain stimulating activities (puzzles, reading, adult coloring books, staying active) to keep memory sharp.   Start to eat heart healthy diet (full of fruits, vegetables, whole grains, lean protein, water--limit salt, fat, and sugar intake) and increase physical activity as tolerated.  I have personally reviewed and noted the following in the patient's chart:   . Medical and social history . Use of alcohol, tobacco or illicit drugs  . Current medications and supplements . Functional ability and status . Nutritional status . Physical activity . Advanced directives . List of other physicians . Vitals . Screenings to include cognitive, depression, and falls . Referrals and appointments  In addition, I have reviewed and discussed with patient certain preventive protocols, quality metrics, and best practice recommendations. A written personalized care plan for preventive services as well as general preventive health recommendations were provided to patient.     Michiel Cowboy, RN   11/06/2016   Medical screening examination/treatment/procedure(s) were performed by non-physician practitioner and as supervising physician I was immediately available for consultation/collaboration. I agree with above. Lew Dawes,  MD

## 2016-11-05 NOTE — Telephone Encounter (Signed)
Called and spoke with patient in regards to Pulmonary Rehab. Patient is still currently receiving HH/PT/OT. They have extended her PT for 8 more weeks. Patient really wants to participate in program but she is aware that her insurance will not cover both. She will call to schedule orientation once at PT is done.

## 2016-11-06 ENCOUNTER — Ambulatory Visit (INDEPENDENT_AMBULATORY_CARE_PROVIDER_SITE_OTHER): Payer: Medicare Other | Admitting: *Deleted

## 2016-11-06 ENCOUNTER — Ambulatory Visit: Payer: Medicare Other

## 2016-11-06 VITALS — BP 132/76 | HR 77 | Resp 22 | Ht 67.0 in | Wt 322.0 lb

## 2016-11-06 DIAGNOSIS — E118 Type 2 diabetes mellitus with unspecified complications: Secondary | ICD-10-CM

## 2016-11-06 DIAGNOSIS — Z0000 Encounter for general adult medical examination without abnormal findings: Secondary | ICD-10-CM

## 2016-11-06 DIAGNOSIS — Z23 Encounter for immunization: Secondary | ICD-10-CM | POA: Diagnosis not present

## 2016-11-06 MED ORDER — CYANOCOBALAMIN 1000 MCG/ML IJ SOLN: 1000.0000 ug | Freq: Once | INTRAMUSCULAR | Status: DC

## 2016-11-06 NOTE — Patient Instructions (Addendum)
Start doing brain stimulating activities (puzzles, reading, adult coloring books, staying active) to keep memory sharp.   Start to eat heart healthy diet (full of fruits, vegetables, whole grains, lean protein, water--limit salt, fat, and sugar intake) and increase physical activity as tolerated.   [name] , Thank you for taking time to come for your Medicare Wellness Visit. I appreciate your ongoing commitment to your health goals. Please review the following plan we discussed and let me know if I can assist you in the future.   These are the goals we discussed: Goals    . Stay as independent as possible          Continue to work with physical therapy, go to pulmonary physical therapy, and do some chair exercises to get me stronger and more mobile.       This is a list of the screening recommended for you and due dates:  Health Maintenance  Topic Date Due  .  Hepatitis C: One time screening is recommended by Center for Disease Control  (CDC) for  adults born from [birth year] through 1965.   1960/07/07  . Pap Smear  07/26/1981  . Colon Cancer Screening  07/27/2010  . Eye exam for diabetics  10/13/2015  . Urine Protein Check  02/01/2016  . Flu Shot  08/14/2016  . Hemoglobin A1C  01/22/2017  . Complete foot exam   08/09/2017  . Mammogram  10/30/2017  . Pneumococcal vaccine (2) 12/24/2018  . Tetanus Vaccine  06/27/2024  . HIV Screening  Completed     It is important to avoid accidents which may result in broken bones.  Here are a few ideas on how to make your home safer so you will be less likely to trip or fall.  1. Use nonskid mats or non slip strips in your shower or tub, on your bathroom floor and around sinks.  If you know that you have spilled water, wipe it up! 2. In the bathroom, it is important to have properly installed grab bars on the walls or on the edge of the tub.  Towel racks are NOT strong enough for you to hold onto or to pull on for support. 3. Stairs and  hallways should have enough light.  Add lamps or night lights if you need ore light. 4. It is good to have handrails on both sides of the stairs if possible.  Always fix broken handrails right away. 5. It is important to see the edges of steps.  Paint the edges of outdoor steps white so you can see them better.  Put colored tape on the edge of inside steps. 6. Throw-rugs are dangerous because they can slide.  Removing the rugs is the best idea, but if they must stay, add adhesive carpet tape to prevent slipping. 7. Do not keep things on stairs or in the halls.  Remove small furniture that blocks the halls as it may cause you to trip.  Keep telephone and electrical cords out of the way where you walk. 8. Always were sturdy, rubber-soled shoes for good support.  Never wear just socks, especially on the stairs.  Socks may cause you to slip or fall.  Do not wear full-length housecoats as you can easily trip on the bottom.  9. Place the things you use the most on the shelves that are the easiest to reach.  If you use a stepstool, make sure it is in good condition.  If you feel unsteady, DO NOT climb,  ask for help. If a health professional advises you to use a cane or walker, do not be ashamed.  These items can keep you from falling and breaking your bones. Eating Healthy on a Budget There are many ways to save money at the grocery store and continue to eat healthy. You can be successful if you plan your meals according to your budget, purchase according to your budget and grocery list, and prepare food yourself. How can I buy more food on a limited budget? Plan  Plan meals and snacks according to a grocery list and budget you create.  Look for recipes where you can cook once and make enough food for two meals.  Include meals that will "stretch" more expensive foods such as stews, casseroles, and stir-fry dishes.  Make a grocery list and make sure to bring it with you to the store. If you have a smart  phone, you could use your phone to create your shopping list. Purchase  When grocery shopping, buy only the items on your grocery list and go only to the areas of the store that have the items on your list. Prepare  Some meal items can be prepared in advance. Pre-cook on days when you have extra time.  Make extra food (such as by doubling recipes) and freeze the extras in meal-sized containers or in individual portions for fast meals and snacks.  Use leftovers in your meal plan for the week.  Try some meatless meals or try "no cook" meals like salads.  When you come home from the grocery store, wash and prepare your fruits and vegetables so they are ready to use and eat. This will help reduce food waste. How can I buy more food on a limited budget? Try these tips the next time you go shopping:  [place] store brands or generic brands.  Use coupons only for foods and brands you normally buy. Avoid buying items you wouldn't normally buy simply because they are on sale.  Check online and in newspapers for weekly deals.  Buy healthy items from the bulk bins when available, such as herbs, spices, flours, pastas, nuts, and dried fruit.  Buy fruits and vegetables that are in season. Prices are usually lower on in-season produce.  Compare and contrast different items. You can do this by looking at the unit price on the price tag. Use it to compare different brands and sizes to find out which item is the best deal.  Choose naturally low-cost healthy items, such as carrots, potatoes, apples, bananas, and oranges. Dried or canned beans are a low-cost protein source.  Buy in bulk and freeze extra food. Items you can buy in bulk include meats, fish, poultry, frozen fruits, and frozen vegetables.  Limit the purchase of prepared or "ready-to-eat" foods, such as pre-cut fruits and vegetables and pre-made salads.  If possible, shop around to discover which grocery store offers the best prices. Some  stores charge much more than other stores for the same items.  Do not shop when you are hungry. If you shop while hungry, It may be hard to stick to your list and budget.  Stick to your list and resist impulse buys. Treat your list as your official plan for the week.  Buy a variety of vegetables and fruit by purchasing fresh, frozen, and canned items.  Look beyond eye level. Foods at eye level (adult or child eye level) are more expensive. Look at the top and bottom shelves for deals.  Be  efficient with your time when shopping. The more time you spend at the store, the more money you are likely to spend.  Consider other retailers such as dollar stores, larger [employer], local fruit and vegetable stands, and farmers markets.  What are some tips for less expensive food substitutions? When choosing more expensive foods like meats and dairy, try these tips to save money:  Choose cheaper cuts of meat, such as bone-in chicken thighs and drumsticks instead skinless and boneless chicken. When you are ready to prepare the chicken, you can remove the skin yourself to make it healthier.  Choose lean meats like chicken or [place]. When choosing ground beef, make sure it is lean ground beef (92% lean, 8% fat). If you do buy a fattier ground beef, drain the fat before eating.  Buy dried beans and peas, such as lentils, split peas, or kidney beans.  For seafood, choose canned tuna, salmon, or sardines.  Eggs are a low-cost source of protein.  Buy the larger tubs of yogurt instead of individual-sized containers.  Choose water instead of sodas and other sweetened beverages.  Skip buying chips, cookies, and other "junk food". These items are usually expensive, high in calories, and low in nutritional value.  How can I prepare the foods I buy in the healthiest way? Practice these tips for cooking foods in the healthiest way to reduce excess fat and calorie intake:  Steam, saute, grill, or bake  foods instead of frying them.  Make sure half your plate is filled with fruits or vegetables. Choose from fresh, frozen, or canned fruits and vegetables. If eating canned, remember to rinse them before eating. This will remove any excess salt added for packaging.  Trim all fat from meat before cooking. Remove the skin from chicken or [place].  Spoon off fat from meat dishes once they have been chilled in the refrigerator and the fat has hardened on the top.  Use skim milk, low-fat milk, or evaporated skim milk when making cream sauces, soups, or puddings.  Substitute low-fat yogurt, sour cream, or cottage cheese for sour cream and mayonnaise in dips and dressings.  Try lemon juice, herbs, or spices to season food instead of salt, butter, or margarine.  This information is not intended to replace advice given to you by your health care provider. Make sure you discuss any questions you have with your health care provider. Document Released: 09/03/2013 Document Revised: 07/21/2015 Document Reviewed: 08/03/2013 Elsevier Interactive Patient Education  2018 [employer].  Carbohydrate Counting for Diabetes Mellitus, Adult Carbohydrate counting is a method for keeping track of how many carbohydrates you eat. Eating carbohydrates naturally increases the amount of sugar (glucose) in the blood. Counting how many carbohydrates you eat helps keep your blood glucose within normal limits, which helps you manage your diabetes (diabetes mellitus). It is important to know how many carbohydrates you can safely have in each meal. This is different for every person. A diet and nutrition specialist (registered dietitian) can help you make a meal plan and calculate how many carbohydrates you should have at each meal and snack. Carbohydrates are found in the following foods:  Grains, such as breads and cereals.  Dried beans and soy products.  Starchy vegetables, such as potatoes, peas, and corn.  Fruit and  fruit juices.  Milk and yogurt.  Sweets and snack foods, such as cake, cookies, candy, chips, and soft drinks.  How do I count carbohydrates? There are two ways to count carbohydrates in food.  You can use either of the methods or a combination of both. Reading "Nutrition Facts" on packaged food The "Nutrition Facts" list is included on the labels of almost all packaged foods and beverages in the [place] It includes:  The serving size.  Information about nutrients in each serving, including the grams (g) of carbohydrate per serving.  To use the "Nutrition Facts":  Decide how many servings you will have.  Multiply the number of servings by the number of carbohydrates per serving.  The resulting number is the total amount of carbohydrates that you will be having.  Learning standard serving sizes of other foods When you eat foods containing carbohydrates that are not packaged or do not include "Nutrition Facts" on the label, you need to measure the servings in order to count the amount of carbohydrates:  Measure the foods that you will eat with a food scale or measuring cup, if needed.  Decide how many standard-size servings you will eat.  Multiply the number of servings by 15. Most carbohydrate-rich foods have about 15 g of carbohydrates per serving. ? For example, if you eat 8 oz (170 g) of strawberries, you will have eaten 2 servings and 30 g of carbohydrates (2 servings x 15 g = 30 g).  For foods that have more than one food mixed, such as soups and casseroles, you must count the carbohydrates in each food that is included.  The following list contains standard serving sizes of common carbohydrate-rich foods. Each of these servings has about 15 g of carbohydrates:   hamburger bun or  English muffin.   oz (15 mL) syrup.   oz (14 g) jelly.  1 slice of bread.  1 six-inch tortilla.  3 oz (85 g) cooked rice or pasta.  4 oz (113 g) cooked dried beans.  4 oz (113 g)  starchy vegetable, such as peas, corn, or potatoes.  4 oz (113 g) hot cereal.  4 oz (113 g) mashed potatoes or  of a large baked potato.  4 oz (113 g) canned or frozen fruit.  4 oz (120 mL) fruit juice.  4-6 crackers.  6 chicken nuggets.  6 oz (170 g) unsweetened dry cereal.  6 oz (170 g) plain fat-free yogurt or yogurt sweetened with artificial sweeteners.  8 oz (240 mL) milk.  8 oz (170 g) fresh fruit or one small piece of fruit.  24 oz (680 g) popped popcorn.  Example of carbohydrate counting Sample meal  3 oz (85 g) chicken breast.  6 oz (170 g) brown rice.  4 oz (113 g) corn.  8 oz (240 mL) milk.  8 oz (170 g) strawberries with sugar-free whipped topping. Carbohydrate calculation 1. Identify the foods that contain carbohydrates: ? Rice. ? Corn. ? Milk. ? Strawberries. 2. Calculate how many servings you have of each food: ? 2 servings rice. ? 1 serving corn. ? 1 serving milk. ? 1 serving strawberries. 3. Multiply each number of servings by 15 g: ? 2 servings rice x 15 g = 30 g. ? 1 serving corn x 15 g = 15 g. ? 1 serving milk x 15 g = 15 g. ? 1 serving strawberries x 15 g = 15 g. 4. Add together all of the amounts to find the total grams of carbohydrates eaten: ? 30 g + 15 g + 15 g + 15 g = 75 g of carbohydrates total. This information is not intended to replace advice given to you by your health care  provider. Make sure you discuss any questions you have with your health care provider. Document Released: 12/31/2004 Document Revised: 07/21/2015 Document Reviewed: 06/14/2015 Elsevier Interactive Patient Education  2018 [employer].  Influenza Virus Vaccine (Flucelvax) What is this medicine? INFLUENZA VIRUS VACCINE (in floo EN zuh VAHY ruhs vak SEEN) helps to reduce the risk of getting influenza also known as the flu. The vaccine only helps protect you against some strains of the flu. This medicine may be used for other purposes; ask your health care  provider or pharmacist if you have questions. COMMON BRAND NAME(S): FLUCELVAX What should I tell my health care provider before I take this medicine? They need to know if you have any of these conditions: -bleeding disorder like hemophilia -fever or infection -Guillain-Barre syndrome or other neurological problems -immune system problems -infection with the human immunodeficiency virus (HIV) or AIDS -low blood platelet counts -multiple sclerosis -an unusual or allergic reaction to influenza virus vaccine, other medicines, foods, dyes or preservatives -pregnant or trying to get pregnant -breast-feeding How should I use this medicine? This vaccine is for injection into a muscle. It is given by a health care professional. A copy of Vaccine Information Statements will be given before each vaccination. Read this sheet carefully each time. The sheet may change frequently. Talk to your pediatrician regarding the use of this medicine in children. Special care may be needed. Overdosage: If you think you've taken too much of this medicine contact a poison control center or emergency room at once. Overdosage: If you think you have taken too much of this medicine contact a poison control center or emergency room at once. NOTE: This medicine is only for you. Do not share this medicine with others. What if I miss a dose? This does not apply. What may interact with this medicine? -chemotherapy or radiation therapy -medicines that lower your immune system like etanercept, anakinra, infliximab, and adalimumab -medicines that treat or prevent blood clots like warfarin -phenytoin -steroid medicines like prednisone or cortisone -theophylline -vaccines This list may not describe all possible interactions. Give your health care provider a list of all the medicines, herbs, non-prescription drugs, or dietary supplements you use. Also tell them if you smoke, drink alcohol, or use illegal drugs. Some items may  interact with your medicine. What should I watch for while using this medicine? Report any side effects that do not go away within 3 days to your doctor or health care professional. Call your health care provider if any unusual symptoms occur within 6 weeks of receiving this vaccine. You may still catch the flu, but the illness is not usually as bad. You cannot get the flu from the vaccine. The vaccine will not protect against colds or other illnesses that may cause fever. The vaccine is needed every year. What side effects may I notice from receiving this medicine? Side effects that you should report to your doctor or health care professional as soon as possible: -allergic reactions like skin rash, itching or hives, swelling of the face, lips, or tongue Side effects that usually do not require medical attention (Report these to your doctor or health care professional if they continue or are bothersome.): -fever -headache -muscle aches and pains -pain, tenderness, redness, or swelling at the injection site -tiredness This list may not describe all possible side effects. Call your doctor for medical advice about side effects. You may report side effects to FDA at 1-800-FDA-1088. Where should I keep my medicine? The vaccine will be given  by a health care professional in a clinic, pharmacy, doctor's office, or other health care setting. You will not be given vaccine doses to store at home. NOTE: This sheet is a summary. It may not cover all possible information. If you have questions about this medicine, talk to your doctor, pharmacist, or health care provider.  2018 Elsevier/Gold Standard (2010-12-12 14:06:47)

## 2016-11-11 ENCOUNTER — Ambulatory Visit (INDEPENDENT_AMBULATORY_CARE_PROVIDER_SITE_OTHER): Payer: Medicare Other | Admitting: Adult Health

## 2016-11-11 ENCOUNTER — Ambulatory Visit (INDEPENDENT_AMBULATORY_CARE_PROVIDER_SITE_OTHER)
Admission: RE | Admit: 2016-11-11 | Discharge: 2016-11-11 | Disposition: A | Discharge status: Home / Self Care | Payer: Medicare Other | Source: Ambulatory Visit | Attending: Adult Health | Admitting: Adult Health

## 2016-11-11 ENCOUNTER — Encounter: Payer: Self-pay | Admitting: Adult Health

## 2016-11-11 ENCOUNTER — Other Ambulatory Visit (INDEPENDENT_AMBULATORY_CARE_PROVIDER_SITE_OTHER): Payer: Medicare Other

## 2016-11-11 VITALS — BP 124/64 | HR 84 | Ht 67.0 in | Wt 321.6 lb

## 2016-11-11 DIAGNOSIS — J208 Acute bronchitis due to other specified organisms: Secondary | ICD-10-CM | POA: Diagnosis not present

## 2016-11-11 DIAGNOSIS — R0602 Shortness of breath: Secondary | ICD-10-CM

## 2016-11-11 DIAGNOSIS — D869 Sarcoidosis, unspecified: Secondary | ICD-10-CM

## 2016-11-11 DIAGNOSIS — I2782 Chronic pulmonary embolism: Secondary | ICD-10-CM | POA: Diagnosis not present

## 2016-11-11 DIAGNOSIS — I519 Heart disease, unspecified: Secondary | ICD-10-CM | POA: Diagnosis not present

## 2016-11-11 DIAGNOSIS — I5189 Other ill-defined heart diseases: Secondary | ICD-10-CM

## 2016-11-11 DIAGNOSIS — M159 Polyosteoarthritis, unspecified: Secondary | ICD-10-CM

## 2016-11-11 DIAGNOSIS — M150 Primary generalized (osteo)arthritis: Secondary | ICD-10-CM

## 2016-11-11 LAB — CBC WITH DIFFERENTIAL/PLATELET
BASOS PCT: 1.3 % (ref 0.0–3.0)
Basophils Absolute: 0.1 10*3/uL (ref 0.0–0.1)
EOS PCT: 2.6 % (ref 0.0–5.0)
Eosinophils Absolute: 0.3 10*3/uL (ref 0.0–0.7)
HCT: 34.3 % — ABNORMAL LOW (ref 36.0–46.0)
Hemoglobin: 10.7 g/dL — ABNORMAL LOW (ref 12.0–15.0)
Lymphocytes Relative: 23 % (ref 12.0–46.0)
Lymphs Abs: 2.2 10*3/uL (ref 0.7–4.0)
MCHC: 31.1 g/dL (ref 30.0–36.0)
MCV: 85.2 fl (ref 78.0–100.0)
MONO ABS: 1.2 10*3/uL — AB (ref 0.1–1.0)
Monocytes Relative: 12.4 % — ABNORMAL HIGH (ref 3.0–12.0)
NEUTROS ABS: 5.8 10*3/uL (ref 1.4–7.7)
Neutrophils Relative %: 60.7 % (ref 43.0–77.0)
PLATELETS: 287 10*3/uL (ref 150.0–400.0)
RBC: 4.03 Mil/uL (ref 3.87–5.11)
RDW: 17.7 % — AB (ref 11.5–15.5)
WBC: 9.6 10*3/uL (ref 4.0–10.5)

## 2016-11-11 LAB — BASIC METABOLIC PANEL
BUN: 10 mg/dL (ref 6–23)
CALCIUM: 9.5 mg/dL (ref 8.4–10.5)
CO2: 27 mEq/L (ref 19–32)
Chloride: 106 mEq/L (ref 96–112)
Creatinine, Ser: 0.93 mg/dL (ref 0.40–1.20)
GFR: 66.21 mL/min (ref >60.00)
Glucose, Bld: 106 mg/dL — ABNORMAL HIGH (ref 70–99)
POTASSIUM: 4.1 meq/L (ref 3.5–5.1)
SODIUM: 140 meq/L (ref 135–145)

## 2016-11-11 LAB — BRAIN NATRIURETIC PEPTIDE: PRO B NATRI PEPTIDE: 194 pg/mL — AB (ref 0.0–100.0)

## 2016-11-11 MED ORDER — DOXYCYCLINE HYCLATE 100 MG PO TABS: 100.0000 mg | ORAL_TABLET | Freq: Two times a day (BID) | ORAL | 0 refills | Status: DC

## 2016-11-11 MED ORDER — PREDNISONE 10 MG PO TABS: ORAL_TABLET | ORAL | 0 refills | Status: DC

## 2016-11-11 MED ORDER — BUDESONIDE-FORMOTEROL FUMARATE 160-4.5 MCG/ACT IN AERO: 2.0000 | INHALATION_SPRAY | Freq: Two times a day (BID) | RESPIRATORY_TRACT | 3 refills | Status: DC

## 2016-11-11 MED ORDER — BUDESONIDE-FORMOTEROL FUMARATE 160-4.5 MCG/ACT IN AERO: 2.0000 | INHALATION_SPRAY | Freq: Two times a day (BID) | RESPIRATORY_TRACT | 0 refills | Status: DC

## 2016-11-11 NOTE — Assessment & Plan Note (Signed)
Back pain - recent fall couple of weeks ago , will need to follow up with PCP for evaluation .

## 2016-11-11 NOTE — Assessment & Plan Note (Signed)
Flare with underlying Sarcoid  Check cxr  Add Symbicort   Plan  . Patient Instructions  Doxycycline 100mg  Twice daily  For 7 days .  Prednisone 20mg  daily for 1 week then 10mg  daily for 1 week and 1/2 daily for 1 week stop .  Mucinex DM Twice daily  As needed  Cough/congestion  Chest xray and labs today  Continue on Oxygen 2l/m .  Continue on Xarelto .  Begin Symbicort 2 puffs Twice daily  , rinse after use .  Take extra Lasix 40mg  daily for 3 days , then back to 40mg  daily .  Follow up with PCP for back pain after fall.  Follow up Dr. Elsworth Soho in 6-8 weeks and As needed  .  Please contact office for sooner follow up if symptoms do not improve or worsen or seek emergency care

## 2016-11-11 NOTE — Patient Instructions (Addendum)
Doxycycline 100mg  Twice daily  For 7 days .  Prednisone 20mg  daily for 1 week then 10mg  daily for 1 week and 1/2 daily for 1 week stop .  Mucinex DM Twice daily  As needed  Cough/congestion  Chest xray and labs today  Continue on Oxygen 2l/m .  Continue on Xarelto .  Begin Symbicort 2 puffs Twice daily  , rinse after use .  Take extra Lasix 40mg  daily for 3 days , then back to 40mg  daily .  Follow up with PCP for back pain after fall.  Follow up Dr. Elsworth Soho in 6-8 weeks and As needed  . With PFT  Please contact office for sooner follow up if symptoms do not improve or worsen or seek emergency care

## 2016-11-11 NOTE — Assessment & Plan Note (Signed)
?  decompensation  Extra lasix x 3 days  Check bmet /bnp  If elevated , consider ECHo as chronic PE on CT chest in 07/2016 .   Plan  Patient Instructions  Doxycycline 100mg  Twice daily  For 7 days .  Prednisone 20mg  daily for 1 week then 10mg  daily for 1 week and 1/2 daily for 1 week stop .  Mucinex DM Twice daily  As needed  Cough/congestion  Chest xray and labs today  Continue on Oxygen 2l/m .  Continue on Xarelto .  Begin Symbicort 2 puffs Twice daily  , rinse after use .  Take extra Lasix 40mg  daily for 3 days , then back to 40mg  daily .  Follow up with PCP for back pain after fall.  Follow up Dr. Elsworth Soho in 6-8 weeks and As needed  .  Please contact office for sooner follow up if symptoms do not improve or worsen or seek emergency care

## 2016-11-11 NOTE — Progress Notes (Signed)
LMOMTCB x 1 

## 2016-11-11 NOTE — Assessment & Plan Note (Signed)
Cont on Xarelto - lifelong

## 2016-11-11 NOTE — Progress Notes (Signed)
Patient seen in the office today and instructed on use of Symbicort 160.  Patient expressed understanding and demonstrated technique. Parke Poisson, CMA 11/11/16

## 2016-11-11 NOTE — Progress Notes (Signed)
@Patient  ID: [name], female    DOB: [birth year], 56 y.o.   MRN: [id number]  Chief Complaint  Patient presents with  . 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   LE Venous Doppler 08/18/15 >>neg DVT , no bakers cyst, ?prominent inguinal lymph node measuring 3.1cm   MRI brain neg [facility] = MRI spine = mild to moderate disc protrusion/left-sided spinal stenosis-chronic pain  NPSG 12/2004 mild, RDI 13/h  11/11/2016 Follow up:  Sarcoid , PE (lifelong on Xarelto) , O2 RF  Pt returns for 1 month follow up . Says she continues to be sob with minimal activity . Complains of cough, congestion with thick green mucus and wheezing for last 1-2 weeks. Gets winded with walking. Breathing worse at bedtime with +orthopnea. Retaining more fluids.  She is on Lasix 39m daily. . No fever, chest pain , n/v/d, abd pain.  Says she fell 2 weeks ago (lost balance turning around)  , back is hurting . Goes to pain clinic but no xray done. Discussed she will need to see PCP for evaluation . Denies arm /leg weakness. No known bleeding . No head injury or LOC.     Allergies  Allergen Reactions  . Enalapril Maleate Cough  . Hydrochlorothiazide Other (See Comments)    Reaction:   Low potassium levels   . Hydroxychloroquine Sulfate Other (See Comments)    Reaction:  Vision changes   . Latex Hives  . Nickel Other (See Comments)    Reaction:  Blisters     Immunization History  Administered Date(s) Administered  . H1N1 12/24/2007  . Influenza Split 10/11/2010  . Influenza Whole 10/14/2007, 10/05/2008, 10/26/2009, 11/13/2011  . Influenza,inj,Quad PF,6+ Mos 09/21/2012, 09/24/2013, 10/06/2014, 09/05/2015, 11/06/2016  . PPD Test 11/18/2014  . Pneumococcal Conjugate-13 07/30/2010, 12/29/2012  . Pneumococcal Polysaccharide-23 12/23/2013  . Tdap 06/28/2014    Past Medical History:  Diagnosis Date  . Allergic rhinitis   . ALLERGIC RHINITIS 10/26/2009  . Anxiety   . ANXIETY 08/14/2006  . B12 DEFICIENCY 08/25/2007  . Confusion   . Depression    takes Lexapro daily  . Diabetes mellitus without complication ([place]    was on insulin but has been off since Nov 2015 and now only takes Metformin daily  . DYSPNEA 04/28/2009   with exertion  . Esophageal reflux    takes Nexium daily  . Fibromyalgia   . Headache    last migraine 2-323yrago;takes Topamax daily  . History of shingles   . Hypertension    takes Coreg daily  . Insomnia    takes Nortriptyline nightly   . Joint pain   . Joint swelling   . Left knee pain   . Lichen planus   . Long-term memory loss   . Nocturia   .  OSA (obstructive sleep apnea)    doesn't use CPAP;sleep study in epic from 2006  . Osteoarthritis   . Osteoarthritis   . Pneumonia    over 30 yrs ago  . Protein calorie malnutrition ([place])   . Rheumatoid arthritis ([place])   . Sarcoidosis    Dr. Lolita Patella  . Short-term memory loss   . Shortness of breath   . TIA (transient ischemic attack)   . Unsteady gait   . Urinary urgency   . Vitamin D deficiency    is supposed to take Vit D but can't afford it  . VITAMIN D DEFICIENCY 08/25/2007    Tobacco History: History  Smoking Status  . Former Smoker  . Packs/day: 0.50  . Years: 10.00  .  Types: Cigarettes  Smokeless Tobacco  . Never Used    Comment: quit smoking in 2004   Counseling given: Not Answered   Outpatient Encounter Prescriptions as of 11/11/2016  Medication Sig  . ACCU-CHEK SOFTCLIX LANCETS lancets 1 each by Other route 2 (two) times daily. Use to check blood sugars twice a day  . Adalimumab (HUMIRA) 40 MG/0.8ML PSKT Inject 40 mg into the skin every 14 (fourteen) days.  [name] albuterol (PROVENTIL HFA;VENTOLIN HFA) 108 (90 Base) MCG/ACT inhaler Inhale 2 puffs into the lungs every 6 (six) hours as needed for wheezing or shortness of breath.  . Blood Glucose Monitoring Suppl (ACCU-CHEK AVIVA PLUS) w/Device KIT Use as directed to check blood sugars  . carvedilol (COREG) 12.5 MG tablet TAKE 1 TABLET BY MOUTH TWICE DAILY WITH A MEAL  . chlorhexidine (PERIDEX) 0.12 % solution Use as directed 5 mLs in the mouth or throat 2 (two) times daily. Pt is to swish and spit.  [name] diclofenac sodium (VOLTAREN) 1 % GEL Apply 2 g topically 2 (two) times daily as needed (for pain).  [name] esomeprazole (NEXIUM) 40 MG capsule Take 1 capsule (40 mg total) by mouth daily before breakfast.  . furosemide (LASIX) 40 MG tablet TAKE 1 TABLET BY MOUTH EVERY MORNING FOR EDEMA  . gabapentin (NEURONTIN) 300 MG capsule Take 1 capsule (300 mg total) by mouth 2 (two) times daily as needed (for pain).  [name] glucose blood (ACCU-CHEK AVIVA PLUS) test strip 1 each by Other route 2 (two) times daily. Use to check blood sugars twice a day  . lidocaine (XYLOCAINE) 2 % solution Use as directed 20 mLs in the mouth or throat 3 (three) times daily as needed for mouth pain. Pt is to swish and spit.  . metFORMIN (GLUCOPHAGE-XR) 500 MG 24 hr tablet TAKE 2 TABLETS(1000 MG) BY MOUTH DAILY WITH BREAKFAST  . nortriptyline (PAMELOR) 75 MG capsule Take 1 capsule (75 mg total) by mouth at bedtime.  . potassium chloride (KLOR-CON 10) 10 MEQ tablet Take 1 tablet (10 mEq total) by mouth daily.  . sitaGLIPtin (JANUVIA) 50 MG tablet Take 1  tablet (50 mg total) by mouth daily.  . Suvorexant (BELSOMRA) 20 MG TABS Take 1 tablet by mouth at bedtime.  . topiramate (TOPAMAX) 50 MG tablet Take 1 tablet (50 mg total) by mouth 2 (two) times daily.  [name] venlafaxine XR (EFFEXOR XR) 75 MG 24 hr capsule Take 1 capsule (75 mg total) by mouth daily with breakfast.  . XARELTO 20 MG TABS tablet Take 1 tablet (20 mg total) by mouth daily. with food  . doxycycline (VIBRA-TABS) 100 MG tablet Take 1 tablet (100 mg total) by mouth 2 (two) times daily.  . predniSONE (DELTASONE) 10 MG tablet  2 tabs daily x1 week, then 1 tab daily x1 week, 1/2 tab daily for 1 week and stop   No facility-administered encounter medications on file as of 11/11/2016.      Review of Systems  Constitutional:   No  weight loss, night sweats,  Fevers, chills,  +fatigue, or  lassitude.  HEENT:   No headaches,  Difficulty swallowing,  Tooth/dental problems, or  Sore throat,                No sneezing, itching, ear ache, + nasal congestion, post nasal drip,   CV:  No chest pain,  Orthopnea, PND,   anasarca, dizziness, palpitations, syncope.   GI  No heartburn, indigestion, abdominal pain, nausea, vomiting, diarrhea, change in bowel habits, loss of appetite, bloody stools.   Resp:    No chest wall deformity  Skin: no rash or lesions.  GU: no dysuria, change in color of urine, no urgency or frequency.  No flank pain, no hematuria   MS:  No joint pain or swelling.  No decreased range of motion.  No back pain.    Physical Exam  BP 124/64 (BP Location: Left Arm, Cuff Size: Large)   Pulse 84   Ht 5' 7"  (1.702 m)   Wt (!) 321 lb 9.6 oz (145.9 kg)   SpO2 96%   BMI 50.37 kg/m   GEN: A/Ox3; pleasant , NAD, obese , on O2 , walker    HEENT:  [place]/AT,  EACs-clear, TMs-wnl, NOSE-clear, THROAT-clear, no lesions, no postnasal drip or exudate noted.   NECK:  Supple w/ fair ROM; no JVD; normal carotid impulses w/o bruits; no thyromegaly or nodules palpated; no lymphadenopathy.     RESP  Decreased BS in bases ,  no accessory muscle use, no dullness to percussion  CARD:  RRR, no m/r/g, 1+ peripheral edema, pulses intact, no cyanosis or clubbing.  GI:   Soft & nt; nml bowel sounds; no organomegaly or masses detected.   Musco: Warm bil, no deformities or joint swelling noted.   Neuro: alert, no focal deficits noted.    Skin: Warm, no lesions or rashes    Lab Results:  CBC    Component Value Date/Time   WBC 10.1 08/23/2016 1104   RBC 4.17 08/23/2016 1104   HGB 12.1 08/23/2016 1104   HGB 13.4 12/06/2013 0930   HCT 38.0 08/23/2016 1104   HCT 43.0 12/06/2013 0930   PLT 246.0 08/23/2016 1104   PLT 216 12/06/2013 0930   MCV 91.1 08/23/2016 1104   MCV 92.1 12/06/2013 0930   MCH 29.5 07/23/2016 0435   MCHC 31.7 08/23/2016 1104   RDW 14.9 08/23/2016 1104   RDW 14.5 12/06/2013 0930   LYMPHSABS 1.9 08/23/2016 1104   LYMPHSABS 1.7 12/06/2013 0930   MONOABS 0.9 08/23/2016 1104   MONOABS 0.6 12/06/2013 0930   EOSABS 0.2 08/23/2016 1104   EOSABS 0.1 12/06/2013 0930   BASOSABS 0.1 08/23/2016 1104   BASOSABS 0.0 12/06/2013 0930    BMET    Component Value Date/Time   NA 137 08/23/2016 1104   NA 144 07/19/2015   NA 140 12/06/2013 0930   K 3.5 08/23/2016 1104   K 3.7 12/06/2013 0930   CL 99 08/23/2016 1104   CO2 30 08/23/2016 1104   CO2 24 12/06/2013 0930   GLUCOSE 198 (H) 08/23/2016 1104   GLUCOSE 110 12/06/2013 0930   BUN 17 08/23/2016 1104   BUN 12 07/19/2015   BUN 11.0 12/06/2013 0930  CREATININE 1.16 08/23/2016 1104   CREATININE 1.1 12/06/2013 0930   CALCIUM 9.7 08/23/2016 1104   CALCIUM 9.4 12/06/2013 0930   GFRNONAA >60 07/22/2016 0055   GFRAA >60 07/22/2016 0055    BNP No results found for: BNP  ProBNP    Component Value Date/Time   PROBNP 16.0 10/22/2006 1118    Imaging: Dg Chest 2 View  Result Date: 11/11/2016 CLINICAL DATA:  Shortness of breath.  History of sarcoidosis. EXAM: CHEST  2 VIEW COMPARISON:  Radiographs of  July 11, 2016.  CT scan of July 21, 2016. FINDINGS: Stable cardiomediastinal silhouette with bilateral hilar prominence. Atherosclerosis of thoracic aorta is noted. No pneumothorax or pleural effusion is noted. Stable interstitial densities are noted throughout both lungs. Bony thorax is unremarkable. IMPRESSION: Aortic atherosclerosis. Stable bilateral hilar adenopathy and interstitial lung densities are noted consistent with a history of sarcoidosis. No significant changes noted compared to prior exam. Electronically Signed   By: Marijo Conception, M.D.   On: 11/11/2016 10:57     Assessment & Plan:   Acute bronchitis Flare with underlying Sarcoid  Check cxr  Add Symbicort   Plan  . Patient Instructions  Doxycycline 188m Twice daily  For 7 days .  Prednisone 282mdaily for 1 week then 1058maily for 1 week and 1/2 daily for 1 week stop .  Mucinex DM Twice daily  As needed  Cough/congestion  Chest xray and labs today  Continue on Oxygen 2l/m .  Continue on Xarelto .  Begin Symbicort 2 puffs Twice daily  , rinse after use .  Take extra Lasix 25m26mily for 3 days , then back to 25mg73mly .  Follow up with PCP for back pain after fall.  Follow up Dr. Alva [place]-8 weeks and As needed  .  Please contact office for sooner follow up if symptoms do not improve or worsen or seek emergency care      Pulmonary embolism (HCC) Vonat on Xarelto - lifelong    Diastolic dysfunction ?decompensation  Extra lasix x 3 days  Check bmet /bnp  If elevated , consider ECHo as chronic PE on CT chest in 07/2016 .   Plan  Patient Instructions  Doxycycline 100mg 52me daily  For 7 days .  Prednisone 20mg d41m for 1 week then 10mg da32mfor 1 week and 1/2 daily for 1 week stop .  Mucinex DM Twice daily  As needed  Cough/congestion  Chest xray and labs today  Continue on Oxygen 2l/m .  Continue on Xarelto .  Begin Symbicort 2 puffs Twice daily  , rinse after use .  Take extra Lasix 25mg dai16mor 3  days , then back to 25mg dail8m Follow up with PCP for back pain after fall.  Follow up Dr. Alva in 6-Elsworth [place]eks and As needed  .  Please contact office for sooner follow up if symptoms do not improve or worsen or seek emergency care      Osteoarthritis Back pain - recent fall couple of weeks ago , will need to follow up with PCP for evaluation .      Deano Tomaszewski ParrRexene Edison/2018

## 2016-11-11 NOTE — Addendum Note (Signed)
Addended by: Parke Poisson E on: 11/11/2016 02:51 PM   Modules accepted: Orders

## 2016-11-12 ENCOUNTER — Telehealth: Payer: Self-pay | Admitting: Adult Health

## 2016-11-12 DIAGNOSIS — R7989 Other specified abnormal findings of blood chemistry: Secondary | ICD-10-CM

## 2016-11-12 DIAGNOSIS — R0602 Shortness of breath: Secondary | ICD-10-CM

## 2016-11-12 DIAGNOSIS — I5189 Other ill-defined heart diseases: Secondary | ICD-10-CM

## 2016-11-12 NOTE — Telephone Encounter (Signed)
Notes recorded by Melvenia Needles, NP on 11/11/2016 at 2:20 PM EDT Kidney fxn is ok .  CHF is slightly elevated. Check 2 D echo .  Mild anemia , follow up with PCP to monitor this .  Cont w/ ov recs Please contact office for sooner follow up if symptoms do not improve or worsen or seek emergency care   Notes recorded by Parrett, Fonnie Mu, NP on 11/11/2016 at 2:17 PM EDT CXR shows stable sarcoid changes  Cont w/ ov recs Please contact office for sooner follow up if symptoms do not improve or worsen or seek emergency care       Advised pt of results. Pt understood and nothing further is needed. Echo ordered.

## 2016-11-13 NOTE — Progress Notes (Signed)
Reviewed & agree with plan  

## 2016-11-14 DIAGNOSIS — M069 Rheumatoid arthritis, unspecified: Secondary | ICD-10-CM | POA: Diagnosis not present

## 2016-11-14 DIAGNOSIS — I2782 Chronic pulmonary embolism: Secondary | ICD-10-CM | POA: Diagnosis not present

## 2016-11-14 DIAGNOSIS — D869 Sarcoidosis, unspecified: Secondary | ICD-10-CM | POA: Diagnosis not present

## 2016-11-14 DIAGNOSIS — E1142 Type 2 diabetes mellitus with diabetic polyneuropathy: Secondary | ICD-10-CM | POA: Diagnosis not present

## 2016-11-14 DIAGNOSIS — I82421 Acute embolism and thrombosis of right iliac vein: Secondary | ICD-10-CM | POA: Diagnosis not present

## 2016-11-14 DIAGNOSIS — M797 Fibromyalgia: Secondary | ICD-10-CM | POA: Diagnosis not present

## 2016-11-15 ENCOUNTER — Encounter: Payer: Self-pay | Admitting: Internal Medicine

## 2016-11-18 ENCOUNTER — Other Ambulatory Visit (HOSPITAL_COMMUNITY): Payer: Medicare Other

## 2016-11-18 ENCOUNTER — Telehealth: Payer: Self-pay | Admitting: Pulmonary Disease

## 2016-11-18 ENCOUNTER — Ambulatory Visit
Admission: RE | Admit: 2016-11-18 | Discharge: 2016-11-18 | Attending: Student in an Organized Health Care Education/Training Program

## 2016-11-18 DIAGNOSIS — Z79899 Other long term (current) drug therapy: Secondary | ICD-10-CM | POA: Diagnosis not present

## 2016-11-18 DIAGNOSIS — D869 Sarcoidosis, unspecified: Secondary | ICD-10-CM | POA: Diagnosis not present

## 2016-11-18 DIAGNOSIS — L650 Telogen effluvium: Secondary | ICD-10-CM | POA: Diagnosis not present

## 2016-11-18 NOTE — Unmapped (Signed)
ASSESSMENT/PLAN:  Sarcoidosis, cutaneous and pulmonary   1. Dr. Vassie Loll (pulmonary) on board with plan, continue adalimumab 40mg  weekly (started eow 06/2016, increased to weekly 09/2016)  2. 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  3. Consider future ophtho exam to screen for ocular sarcoid - she was referred but has not been able to make it due to other medical issues.    Telogen effluvium, likely related to recent DVT and hospitalization - reassurance on natural course provided.    RTC: 12 weeks    SUBJECTIVE:  CC:  Carolyn Newton is a 56 y.o. female  who presents for follow-up of sarcoidosis  She started adalimumab (80mg  loading, 40mg  eow maintenance) in 06/2016, increased to 40mg  weekly at last visit.  Thinks this is helping more than eow dosing and she doesn't have cyclic flares before each dose like she did before.  ILK usually helps improve symptoms. She developed a DVT over the summer and is getting an echocardiogram in the next couple of weeks since her BNP was slightly elevated.  She says there is also concern that it she could have some neurologic involvement, which is being worked up.  Also has history of lichen planus.  Also noted diffuse hair shedding in the last few weeks.    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  Many years on and off of prednisone with temporary improvement, but overall thinks she is worse over time.  Has had a large amount of weight gain in the last year since restarting prednisone in April 2016.  Had come off recently, but now on Medrol dose pack from her PCP.  Was on methotrexate given worsening cutaneous involvement of the scalp.  Dose was 10mg  taken 2 doses 12 hours apart in winter 2017-18.  Did that for 3 months, but stopped because she thought it wasn't helping.  Had been on hydroxychloroquine many years ago, but it was not very helpful.  Has been on doxycycline many times over the years without much improvement.     Followed by Dr. Vassie Loll for pulmonary sarcoidosis and we have discussed the treatment plan.    ROS: The balance of 10 systems is negative unless otherwise documented..      OBJECTIVE:     Gen: Well appearing, well nourished female in no acute distress. Alert and oriented x 3  Exam: Exam of the scalp, face, eyelids, lips, neck, chest, abdomen, back, bilateral upper and lower extremities, palms, soles, and nails was performed and notable for:  1. Alopecic scaly, erythematuos patches on the frontal and occipital scalp, similar plaques on the L upper arm, L thigh, concha bilaterally, and nasolabial folds.  Slight-moderate interval improvement.  2. Moderate diffuse thinning of hair on most of the scalp  -sites not commented on demonstrate normal findings.

## 2016-11-18 NOTE — Unmapped (Addendum)
Sarcoidosis: Care Instructions  Your Care Instructions    Sarcoidosis (say sar-koy-DOH-sus) is a rare disease that causes tiny lumps of cells throughout the body called granulomas. These lumps are too small to see or feel. They can form anywhere on the inside or outside of the body and can cause permanent scar tissue. They often form in the lungs, lymph nodes, liver, skin, or eyes. Sarcoidosis may affect how an organ works. For instance, if it is in the lungs, you may be short of breath.  For most people, sarcoidosis is a long-term disease that lasts several years or a lifetime. But some cases go away in a few months. Experts have no way of knowing how it will affect you. For some people, the disease may cause no symptoms at all. For others, symptoms may include fever, body aches, swollen lymph glands, shortness of breath, painful joints, and numbness. It may lead to lung or heart problems. Sometimes sarcoidosis can cause high calcium levels in the blood.  Sarcoidosis occurs most often in young and middle-aged adults. Although the cause is not known, the disease does not spread from person to person.  Different types of sarcoidosis have different treatments. Sarcoidosis may require long-term treatment (lasting months to years) with corticosteroids and other medicines, especially if it causes symptoms. You may also need regular tests.  Follow-up care is a key part of your treatment and safety. Be sure to make and go to all appointments, and call your doctor if you are having problems. It's also a good idea to know your test results and keep a list of the medicines you take.  How can you care for yourself at home?  ?? Take your medicines exactly as prescribed. Call your doctor if you think you are having a problem with your medicine.  ?? Do not smoke. Smoking can make sarcoidosis worse. If you need help quitting, talk to your doctor about stop-smoking programs and medicines. These can increase your chances of quitting for good.  ?? Avoid dust, smoke, and fumes. They can harm your lungs.  ?? Drink plenty of fluids, enough so that your urine is light yellow or clear like water. If you have kidney, heart, or liver disease and have to limit fluids, talk with your doctor before you increase the amount of fluids you drink.  ?? If your doctor recommends it, get more exercise. Walking is a good choice. Bit by bit, increase the amount you walk every day. Try for at least 30 minutes on most days of the week. You also may want to swim, bike, or do other activities.  When should you call for help?  Call 911 anytime you think you may need emergency care. For example, call if:  ?? ?? You have severe trouble breathing.   ?? ?? You passed out (lost consciousness).   ??Call your doctor now or seek immediate medical care if:  ?? ?? You have changes in your vision.   ?? ?? You are very tired, get confused, or urinate a lot.   ?? ?? Your symptoms do not get better, or they get worse.   ??Watch closely for changes in your health, and be sure to contact your doctor if you have any problems.  Where can you learn more?  Go to [facility] at [email or web address]  Select Preferences in the upper right hand corner, then select Health Library under Resources. Enter H756 in the search box to learn more about Sarcoidosis: Care Instructions.  Current as of: December 20, 2015  Content Version: 11.8  ?? 2006-2018 [employer]. Care instructions adapted under license by [facility]. If you have questions about a medical condition or this instruction, always ask your healthcare professional. [employer] disclaims any warranty or liability for your use of this information.

## 2016-11-18 NOTE — Telephone Encounter (Signed)
[name], [place]      11/12/16 4:46 PM  Note    Notes recorded by Melvenia Needles, NP on 11/11/2016 at 2:20 PM EDT Kidney fxn is ok .  CHF is slightly elevated. Check 2 D echo .  Mild anemia , follow up with PCP to monitor this .  Cont w/ ov recs Please contact office for sooner follow up if symptoms do not improve or worsen or seek emergency care   Notes recorded by Parrett, Fonnie Mu, NP on 11/11/2016 at 2:17 PM EDT CXR shows stable sarcoid changes  Cont w/ ov recs Please contact office for sooner follow up if symptoms do not improve or worsen or seek emergency care       Advised pt of results. Pt understood and nothing further is needed. Echo ordered.

## 2016-11-20 DIAGNOSIS — M069 Rheumatoid arthritis, unspecified: Secondary | ICD-10-CM | POA: Diagnosis not present

## 2016-11-20 DIAGNOSIS — I2782 Chronic pulmonary embolism: Secondary | ICD-10-CM | POA: Diagnosis not present

## 2016-11-20 DIAGNOSIS — I82421 Acute embolism and thrombosis of right iliac vein: Secondary | ICD-10-CM | POA: Diagnosis not present

## 2016-11-20 DIAGNOSIS — D869 Sarcoidosis, unspecified: Secondary | ICD-10-CM | POA: Diagnosis not present

## 2016-11-20 DIAGNOSIS — E1142 Type 2 diabetes mellitus with diabetic polyneuropathy: Secondary | ICD-10-CM | POA: Diagnosis not present

## 2016-11-20 DIAGNOSIS — M797 Fibromyalgia: Secondary | ICD-10-CM | POA: Diagnosis not present

## 2016-11-21 ENCOUNTER — Ambulatory Visit (HOSPITAL_COMMUNITY): Payer: Medicare Other | Attending: Cardiology

## 2016-11-21 ENCOUNTER — Other Ambulatory Visit: Payer: Self-pay

## 2016-11-21 DIAGNOSIS — Z8673 Personal history of transient ischemic attack (TIA), and cerebral infarction without residual deficits: Secondary | ICD-10-CM | POA: Insufficient documentation

## 2016-11-21 DIAGNOSIS — R0602 Shortness of breath: Secondary | ICD-10-CM | POA: Diagnosis not present

## 2016-11-21 DIAGNOSIS — Z87891 Personal history of nicotine dependence: Secondary | ICD-10-CM | POA: Diagnosis not present

## 2016-11-21 DIAGNOSIS — M797 Fibromyalgia: Secondary | ICD-10-CM | POA: Diagnosis not present

## 2016-11-21 DIAGNOSIS — G4733 Obstructive sleep apnea (adult) (pediatric): Secondary | ICD-10-CM | POA: Insufficient documentation

## 2016-11-21 DIAGNOSIS — D869 Sarcoidosis, unspecified: Secondary | ICD-10-CM | POA: Insufficient documentation

## 2016-11-21 DIAGNOSIS — E119 Type 2 diabetes mellitus without complications: Secondary | ICD-10-CM | POA: Insufficient documentation

## 2016-11-21 DIAGNOSIS — I517 Cardiomegaly: Secondary | ICD-10-CM | POA: Insufficient documentation

## 2016-11-22 DIAGNOSIS — E1142 Type 2 diabetes mellitus with diabetic polyneuropathy: Secondary | ICD-10-CM | POA: Diagnosis not present

## 2016-11-22 DIAGNOSIS — I2782 Chronic pulmonary embolism: Secondary | ICD-10-CM | POA: Diagnosis not present

## 2016-11-22 DIAGNOSIS — I82421 Acute embolism and thrombosis of right iliac vein: Secondary | ICD-10-CM | POA: Diagnosis not present

## 2016-11-22 DIAGNOSIS — M797 Fibromyalgia: Secondary | ICD-10-CM | POA: Diagnosis not present

## 2016-11-22 DIAGNOSIS — D869 Sarcoidosis, unspecified: Secondary | ICD-10-CM | POA: Diagnosis not present

## 2016-11-22 DIAGNOSIS — M069 Rheumatoid arthritis, unspecified: Secondary | ICD-10-CM | POA: Diagnosis not present

## 2016-11-25 DIAGNOSIS — I2782 Chronic pulmonary embolism: Secondary | ICD-10-CM | POA: Diagnosis not present

## 2016-11-25 DIAGNOSIS — M069 Rheumatoid arthritis, unspecified: Secondary | ICD-10-CM | POA: Diagnosis not present

## 2016-11-25 DIAGNOSIS — M797 Fibromyalgia: Secondary | ICD-10-CM | POA: Diagnosis not present

## 2016-11-25 DIAGNOSIS — I82421 Acute embolism and thrombosis of right iliac vein: Secondary | ICD-10-CM | POA: Diagnosis not present

## 2016-11-25 DIAGNOSIS — D869 Sarcoidosis, unspecified: Secondary | ICD-10-CM | POA: Diagnosis not present

## 2016-11-25 DIAGNOSIS — E1142 Type 2 diabetes mellitus with diabetic polyneuropathy: Secondary | ICD-10-CM | POA: Diagnosis not present

## 2016-11-27 ENCOUNTER — Other Ambulatory Visit: Payer: Self-pay | Admitting: Pharmacist

## 2016-11-28 ENCOUNTER — Other Ambulatory Visit: Payer: Self-pay

## 2016-11-28 NOTE — Patient Outreach (Signed)
[facility]) Care Management  11/28/16   Gaelle Adriance [facility] 10-15-1960 [id number]  Successful outreach completed with patient. Patient identification verified. Patient stated that "I am ok." She continued to state that she is still not living in her home - she was displaced several weeks ago due to repairs and updates taking place at her apartment complex. Patient stated that not being home has "not been going well." She reported that the place she is staying at is not what she expected it to be. She reported that it is "raggedy" and she is living out of her boxes until she can hopefully go home next week. She stated that the people on both sides of her are smoking in their rooms despite that not being allowed and the smoke travels over in to her room. She also stated that she is not resting or sleeping well there.  Patient stated that she has been going to a friends house to babysit during the day, but she is going to have to quit that because it is "too much." She stated that she has a lot of steps to go up and down and it is just too much. She is babysitting her grandson and stated that she enjoys being around him, but she just cannot keep doing it. She stated it may be different if she were able to watch him in her own home, but that has not been the case. She stated that she is not wearing her oxygen 24 hours per day. It appears she only goes without when she is traveling. She stated that she will take a portable tank with her to babysit and will use it when going up/down the stairs and after playing with her grandson. She stated that when she gets home, she turns on her home concentrator and uses that, although she has been catching herself waking up in the middle of the night with her oxygen off and she does not know why.  She stated she guess she moves and it comes off or she unknowingly takes it off when she is sleeping. Patient also stated that she has been getting some  nosebleeds with her oxygen. RNCM assessed if her oxygen had humidification on it and she stated that it did not. RNCM offered to call and get this added to her concentrator, but she stated she does not know what day she will be home and prefers call early next week to make sure she will be home before arranging this.  Patient stated that her therapist has been coming to her friends home, or to her temporary room for continued physical therapy. She stated that has been going well and she has been more mobile. She also stated that she is not getting as short of breath with activity as she was before.   Patient currently denies any shortness of breath or chest pain. She stated that her only complaint is that she is tired and needs some sleep. She does not anticipate getting to rest when she returns home because she will have so much work to do to unpack all of her boxes.   Patient stated that she was able to get the correct supplies for her new glucometer and has been checking her blood sugar. She denies any other needs related to her glucometer at this time.  Plan: RNCM to follow up next week with patient once she has returned home and will assess for additional needs. Will also plan to contact her DME provider  and/or healthcare provider regarding humidification for her oxygen concentrator at home.  [name] Reeder Brisby, RN, BSN, [place] Management Coordinator [phone]

## 2016-11-28 NOTE — Patient Outreach (Signed)
[facility]) Care Management  11/28/2016  [name] 06/08/1960 [id number]   Patient called on 11/27/16 stating she was out of her topiramate and had been out of it for >2 weeks. HIPAA identifiers were obtained. [facility] was called on the patient's behalf.  Topiramate was filled 11/13/16 for a 90 day supply and was not eligible for refill until 12/12/16.  Patient's apartment complex had mold so she has been displaced for >1 month at a local hotel. Although this is the case, the patient vehemently stated she did not have enough. The pharmacy technician at [facility] said they would give the patient enough medication to get her until the 12/12/16 refill date at no charge and deliver it to her.     Patient communicated understanding.  Plan: Call the patient in 1-2 business days to be sure she received her medication.   Elayne Guerin, PharmD, [facility] Clinical Pharmacist [phone]

## 2016-11-29 ENCOUNTER — Other Ambulatory Visit: Payer: Self-pay | Admitting: Pharmacist

## 2016-11-29 NOTE — Patient Outreach (Signed)
[facility]) Care Management  11/29/2016  [place] 20-Jan-1960 [id number]  Patient was called to follow up on the delivery of Topiramate. HIPAA identifiers were obtained. Patient said she did not receive Topiramate. [facility] was called.  The Pharmacist at [facility]), did not have on record where the technician told me they would send her some Topiramate yesterday.  Rachel Bo confirmed it would be sent to the patient today.  Patient was called back to let her know the status.  Plan Follow up with patient in 10-14 days for medication review.  Elayne Guerin, PharmD, [place] Clinical Pharmacist [phone]

## 2016-12-03 ENCOUNTER — Other Ambulatory Visit: Payer: Self-pay | Admitting: Internal Medicine

## 2016-12-03 NOTE — Telephone Encounter (Signed)
Pt called stating that she is needing this refill.

## 2016-12-08 ENCOUNTER — Other Ambulatory Visit: Payer: Self-pay

## 2016-12-08 ENCOUNTER — Encounter (HOSPITAL_COMMUNITY): Payer: Self-pay | Admitting: Emergency Medicine

## 2016-12-08 ENCOUNTER — Emergency Department (HOSPITAL_COMMUNITY): Payer: Medicare Other

## 2016-12-08 ENCOUNTER — Emergency Department (HOSPITAL_COMMUNITY)
Admission: EM | Admit: 2016-12-08 | Discharge: 2016-12-09 | Disposition: A | Discharge status: Home / Self Care | Payer: Medicare Other | Attending: Emergency Medicine | Admitting: Emergency Medicine

## 2016-12-08 DIAGNOSIS — Z91040 Latex allergy status: Secondary | ICD-10-CM | POA: Insufficient documentation

## 2016-12-08 DIAGNOSIS — R079 Chest pain, unspecified: Secondary | ICD-10-CM

## 2016-12-08 DIAGNOSIS — I2510 Atherosclerotic heart disease of native coronary artery without angina pectoris: Secondary | ICD-10-CM | POA: Insufficient documentation

## 2016-12-08 DIAGNOSIS — Z79899 Other long term (current) drug therapy: Secondary | ICD-10-CM | POA: Insufficient documentation

## 2016-12-08 DIAGNOSIS — R110 Nausea: Secondary | ICD-10-CM | POA: Insufficient documentation

## 2016-12-08 DIAGNOSIS — E119 Type 2 diabetes mellitus without complications: Secondary | ICD-10-CM | POA: Diagnosis not present

## 2016-12-08 DIAGNOSIS — R072 Precordial pain: Secondary | ICD-10-CM | POA: Diagnosis not present

## 2016-12-08 DIAGNOSIS — Z87891 Personal history of nicotine dependence: Secondary | ICD-10-CM | POA: Insufficient documentation

## 2016-12-08 DIAGNOSIS — Z7901 Long term (current) use of anticoagulants: Secondary | ICD-10-CM | POA: Diagnosis not present

## 2016-12-08 DIAGNOSIS — Z7984 Long term (current) use of oral hypoglycemic drugs: Secondary | ICD-10-CM | POA: Diagnosis not present

## 2016-12-08 DIAGNOSIS — R600 Localized edema: Secondary | ICD-10-CM | POA: Diagnosis not present

## 2016-12-08 DIAGNOSIS — I10 Essential (primary) hypertension: Secondary | ICD-10-CM | POA: Insufficient documentation

## 2016-12-08 DIAGNOSIS — Z96653 Presence of artificial knee joint, bilateral: Secondary | ICD-10-CM | POA: Insufficient documentation

## 2016-12-08 DIAGNOSIS — Z8673 Personal history of transient ischemic attack (TIA), and cerebral infarction without residual deficits: Secondary | ICD-10-CM | POA: Diagnosis not present

## 2016-12-08 LAB — BASIC METABOLIC PANEL
Anion gap: 11 (ref 5–15)
BUN: 13 mg/dL (ref 6–20)
CHLORIDE: 101 mmol/L (ref 101–111)
CO2: 21 mmol/L — ABNORMAL LOW (ref 22–32)
CREATININE: 1.14 mg/dL — AB (ref 0.44–1.00)
Calcium: 8.6 mg/dL — ABNORMAL LOW (ref 8.9–10.3)
GFR, EST NON AFRICAN AMERICAN: 53 mL/min — AB (ref >60)
Glucose, Bld: 142 mg/dL — ABNORMAL HIGH (ref 65–99)
POTASSIUM: 3.3 mmol/L — AB (ref 3.5–5.1)
SODIUM: 133 mmol/L — AB (ref 135–145)

## 2016-12-08 LAB — CBC
HEMATOCRIT: 38.1 % (ref 36.0–46.0)
Hemoglobin: 11.2 g/dL — ABNORMAL LOW (ref 12.0–15.0)
MCH: 25.5 pg — ABNORMAL LOW (ref 26.0–34.0)
MCHC: 29.4 g/dL — ABNORMAL LOW (ref 30.0–36.0)
MCV: 86.8 fL (ref 78.0–100.0)
PLATELETS: 223 10*3/uL (ref 150–400)
RBC: 4.39 MIL/uL (ref 3.87–5.11)
RDW: 17.6 % — AB (ref 11.5–15.5)
WBC: 8.7 10*3/uL (ref 4.0–10.5)

## 2016-12-08 LAB — I-STAT TROPONIN, ED: Troponin i, poc: 0 ng/mL (ref 0.00–0.08)

## 2016-12-08 NOTE — ED Triage Notes (Signed)
Pt BIB EMS from home for CP; onset 1830. Sharp, intermittent, with radiation to rt shoulder and neck. Denies CP at this time.12 lead unremarkable with EMS. Pt received 324 mg ASA and 1 nitro PTA. A&oX4, resp e/u. NAD at this time.

## 2016-12-08 NOTE — ED Provider Notes (Signed)
[facility] EMERGENCY DEPARTMENT Provider Note   CSN: [id number] Arrival date & time: 12/08/16  1940     History   Chief Complaint Chief Complaint  Patient presents with  . Chest Pain    HPI URA YINGLING is a 56 y.o. female.  The history is provided by the patient.  Chest Pain   This is a new problem. The current episode started 1 to 2 hours ago. The problem occurs constantly. The problem has been resolved. The pain is associated with rest. Pain location: Pt states pain started in substernal area and the moved to left side, then right side, then right shoulder. The pain is at a severity of 7/10. The quality of the pain is described as sharp. The pain radiates to the right neck and right shoulder. Duration of episode(s) is 1 hour. Associated symptoms include nausea. Pertinent negatives include no abdominal pain, no back pain, no cough, no diaphoresis, no fever, no lower extremity edema, no palpitations, no shortness of breath, no vomiting and no weakness. She has tried nitroglycerin for the symptoms. The treatment provided significant relief. Risk factors include sedentary lifestyle and obesity.  Her past medical history is significant for diabetes, DVT (on xarelto) and hypertension.  Pertinent negatives for past medical history include no PE and no seizures. Past medical history comments: sarcoidosis  Procedure history is positive for echocardiogram.  On 2LO2 for sarcoidosis. No hx of CAD. Given 324 ASA by EMS and nitro which resolved her pain.  Past Medical History:  Diagnosis Date  . Allergic rhinitis   . ALLERGIC RHINITIS 10/26/2009  . Anxiety   . ANXIETY 08/14/2006  . B12 DEFICIENCY 08/25/2007  . Confusion   . Depression    takes Lexapro daily  . Diabetes mellitus without complication ([place])    was on insulin but has been off since Nov 2015 and now only takes Metformin daily  . DYSPNEA 04/28/2009   with exertion  . Esophageal reflux    takes  Nexium daily  . Fibromyalgia   . Headache    last migraine 2-51yrs ago;takes Topamax daily  . History of shingles   . Hypertension    takes Coreg daily  . Insomnia    takes Nortriptyline nightly   . Joint pain   . Joint swelling   . Left knee pain   . Lichen planus   . Long-term memory loss   . Nocturia   . OSA (obstructive sleep apnea)    doesn't use CPAP;sleep study in epic from 2006  . Osteoarthritis   . Osteoarthritis   . Pneumonia    over 30 yrs ago  . Protein calorie malnutrition ([place])   . Rheumatoid arthritis ([place])   . Sarcoidosis    Dr. Lolita Patella  . Short-term memory loss   . Shortness of breath   . TIA (transient ischemic attack)   . Unsteady gait   . Urinary urgency   . Vitamin D deficiency    is supposed to take Vit D but can't afford it  . VITAMIN D DEFICIENCY 08/25/2007    Patient Active Problem List   Diagnosis Date Noted  . Diastolic dysfunction 02/77/4128  . OSA (obstructive sleep apnea) 09/30/2016  . Chronic respiratory failure with hypoxia ([place]) 08/23/2016  . Pulmonary embolism ([place]) 07/21/2016  . DVT (deep venous thrombosis) ([place]) 07/21/2016  . Cough 07/11/2016  . Balance problems 06/14/2016  . Pain of right scapula 06/07/2016  . Acute bronchitis 01/30/2016  . Weight gain 12/20/2015  .  Acute pulmonary embolism ([place]) 08/17/2015  . Diabetes mellitus type 2 with complications ([place]) 51/88/4166  . Postoperative anemia due to acute blood loss 02/01/2015  . Arthritis of knee, degenerative 11/15/2014  . Calf pain 01/21/2014  . Insomnia 12/23/2013  . Leg ulcer, left ([place]) 09/13/2013  . Abnormal SPEP 06/04/2013  . Memory loss 04/27/2013  . Panic attacks 03/17/2013  . Right knee pain 12/29/2012  . Rash and nonspecific skin eruption 10/27/2012  . Dizziness and giddiness 09/07/2012  . Pulmonary nodules 07/20/2012  . CAD in native artery 05/28/2012  . Seborrheic dermatitis 01/20/2012  . Recurrent knee pain 09/03/2011  . Knee pain, left 08/03/2011  .  Leg pain, left 07/17/2011  . Polydipsia 05/09/2011  . Abscess of axilla, left 08/10/2010  . Obesity 03/07/2010  . INSOMNIA, CHRONIC 03/07/2010  . NAUSEA 03/07/2010  . Avulsion fracture of tooth 03/07/2010  . Allergic rhinitis 10/26/2009  . GRIEF REACTION 08/01/2009  . DYSPNEA 04/28/2009  . MAXILLARY SINUSITIS 10/05/2008  . Essential hypertension 08/27/2007  . B12 deficiency 08/25/2007  . Vitamin D deficiency 08/25/2007  . ELEVATED BP 07/10/2007  . VISUAL CHANGES 07/02/2007  . UTI 02/07/2007  . ABDOMINAL PAIN RIGHT LOWER QUADRANT 02/07/2007  . SINUSITIS, ACUTE 02/02/2007  . Osteoarthritis 02/02/2007  . Sarcoidosis 08/14/2006  . Anxiety state 08/14/2006  . Major depressive disorder, recurrent episode ([place]) 08/14/2006  . GERD 08/14/2006    Past Surgical History:  Procedure Laterality Date  . APPENDECTOMY    . arthroscopic knee surgery Right 11-12-04  . AXILLARY ABCESS IRRIGATION AND DEBRIDEMENT  Jul & Aug2012  . CARPAL TUNNEL RELEASE Left 05/23/2014   Procedure: CARPAL TUNNEL RELEASE;  Surgeon: Meredith Pel, MD;  Location: [facility];  Service: Orthopedics;  Laterality: Left;  . cyst removed from top of buttocks  at age 26  . ENDOMETRIAL ABLATION    . TOTAL KNEE ARTHROPLASTY Right 11/15/2014   Procedure: TOTAL RIGHT KNEE ARTHROPLASTY;  Surgeon: Meredith Pel, MD;  Location: [facility];  Service: Orthopedics;  Laterality: Right;  . TOTAL KNEE ARTHROPLASTY Left 07/13/2015   Procedure: LEFT TOTAL KNEE ARTHROPLASTY;  Surgeon: Meredith Pel, MD;  Location: [facility];  Service: Orthopedics;  Laterality: Left;    OB History    No data available       Home Medications    Prior to Admission medications   Medication Sig Start Date End Date Taking? Authorizing Provider  Adalimumab (HUMIRA) 40 MG/0.8ML PSKT Inject 40 mg into the skin every 14 (fourteen) days.   Yes [provider]  albuterol (PROVENTIL HFA;VENTOLIN HFA) 108 (90 Base) MCG/ACT inhaler Inhale 2 puffs into the  lungs every 6 (six) hours as needed for wheezing or shortness of breath. 09/13/16  Yes Plotnikov, Evie Lacks, MD  budesonide-formoterol (SYMBICORT) 160-4.5 MCG/ACT inhaler Inhale 2 puffs into the lungs 2 (two) times daily. 11/11/16  Yes Parrett, Tammy S, NP  carvedilol (COREG) 12.5 MG tablet TAKE 1 TABLET BY MOUTH TWICE DAILY WITH A MEAL 09/13/16  Yes Plotnikov, Evie Lacks, MD  diclofenac sodium (VOLTAREN) 1 % GEL Apply 2 g topically 2 (two) times daily as needed (for pain). 09/13/16  Yes Plotnikov, Evie Lacks, MD  esomeprazole (NEXIUM) 40 MG capsule Take 1 capsule (40 mg total) by mouth daily before breakfast. 10/10/16  Yes Plotnikov, Evie Lacks, MD  furosemide (LASIX) 40 MG tablet TAKE 1 TABLET BY MOUTH EVERY MORNING FOR EDEMA Patient taking differently: Take 40 mg by mouth daily. TAKE 1 TABLET BY MOUTH EVERY MORNING  FOR EDEMA 09/13/16  Yes Plotnikov, Evie Lacks, MD  gabapentin (NEURONTIN) 300 MG capsule Take 1 capsule (300 mg total) by mouth 2 (two) times daily as needed (for pain). 09/13/16  Yes Plotnikov, Evie Lacks, MD  lidocaine (XYLOCAINE) 2 % solution Use as directed 20 mLs in the mouth or throat 3 (three) times daily as needed for mouth pain. Pt is to swish and spit.   Yes [place], [place], DDS  metFORMIN (GLUCOPHAGE-XR) 500 MG 24 hr tablet TAKE 2 TABLETS(1000 MG) BY MOUTH DAILY WITH BREAKFAST Patient taking differently: Take 1,000 mg by mouth daily with breakfast. TAKE 2 TABLETS(1000 MG) BY MOUTH DAILY WITH BREAKFAST 09/18/16  Yes Renato Shin, MD  nortriptyline (PAMELOR) 75 MG capsule Take 1 capsule (75 mg total) by mouth at bedtime. 09/13/16  Yes Plotnikov, Evie Lacks, MD  potassium chloride (KLOR-CON 10) 10 MEQ tablet Take 1 tablet (10 mEq total) by mouth daily. 08/23/16  Yes Plotnikov, Evie Lacks, MD  sitaGLIPtin (JANUVIA) 50 MG tablet Take 1 tablet (50 mg total) by mouth daily. 08/09/16  Yes Renato Shin, MD  Suvorexant (BELSOMRA) 20 MG TABS Take 1 tablet by mouth at bedtime. 09/13/16  Yes Plotnikov,  Evie Lacks, MD  topiramate (TOPAMAX) 50 MG tablet Take 1 tablet (50 mg total) by mouth 2 (two) times daily. 09/13/16  Yes Plotnikov, Evie Lacks, MD  venlafaxine XR (EFFEXOR-XR) 75 MG 24 hr capsule Take 1 capsule (75 mg total) by mouth daily with breakfast. 12/04/16  Yes Plotnikov, Evie Lacks, MD  XARELTO 20 MG TABS tablet Take 1 tablet (20 mg total) by mouth daily. with food 09/13/16  Yes Plotnikov, Evie Lacks, MD  chlorhexidine (PERIDEX) 0.12 % solution Use as directed 5 mLs in the mouth or throat 2 (two) times daily. Pt is to swish and spit. Patient not taking: Reported on 12/09/2016 09/13/16   Plotnikov, Evie Lacks, MD    Family History Family History  Problem Relation Age of Onset  . Heart disease Mother   . Kidney disease Mother   . Cancer Father        leukemia  . Hypertension Unknown   . Coronary artery disease Unknown        female 1st degree relative <60  . Heart failure Unknown        congestive  . Coronary artery disease Unknown        Female 1st degree relative <50  . Breast cancer Unknown        1st degree relative <50 S    Social History Social History   Tobacco Use  . Smoking status: Former Smoker    Packs/day: 0.50    Years: 10.00    Pack years: 5.00    Types: Cigarettes  . Smokeless tobacco: Never Used  . Tobacco comment: quit smoking in 2004  Substance Use Topics  . Alcohol use: No  . Drug use: No     Allergies   Enalapril maleate; Hydrochlorothiazide; Hydroxychloroquine sulfate; Latex; and Nickel   Review of Systems Review of Systems  Constitutional: Negative for chills, diaphoresis and fever.  HENT: Negative for ear pain and sore throat.   Eyes: Negative for pain and visual disturbance.  Respiratory: Negative for cough and shortness of breath.   Cardiovascular: Positive for chest pain. Negative for palpitations.  Gastrointestinal: Positive for nausea. Negative for abdominal pain and vomiting.  Genitourinary: Negative for dysuria and hematuria.    Musculoskeletal: Negative for arthralgias and back pain.  Skin: Negative for color change and rash.  Neurological: Negative for seizures, syncope and weakness.  All other systems reviewed and are negative.    Physical Exam Updated Vital Signs BP (!) 108/54   Pulse 90   Temp 98.5 F (36.9 C) (Axillary)   Resp 20   Ht 5\' 7"  (1.702 m)   Wt (!) 142.9 kg (315 lb)   SpO2 94%   BMI 49.34 kg/m   Physical Exam  Constitutional: She is oriented to person, place, and time. She appears well-developed and well-nourished. No distress.  HENT:  Head: Normocephalic and atraumatic.  Eyes: Conjunctivae and EOM are normal. Pupils are equal, round, and reactive to light.  Neck: Neck supple.  Cardiovascular: Normal rate, regular rhythm, intact distal pulses and normal pulses. Exam reveals no S3 and no S4.  No murmur heard. Pulmonary/Chest: Effort normal and breath sounds normal. No accessory muscle usage. No tachypnea. No respiratory distress. She has no decreased breath sounds. She has no wheezes. She has no rhonchi.  Abdominal: Soft. She exhibits no distension. There is no tenderness.  Musculoskeletal:       Right lower leg: She exhibits edema (trace).       Left lower leg: She exhibits edema (trace).  Neurological: She is alert and oriented to person, place, and time.  Skin: Skin is warm and dry. Capillary refill takes less than 2 seconds.  Psychiatric: She has a normal mood and affect.  Nursing note and vitals reviewed.    ED Treatments / Results  Labs (all labs ordered are listed, but only abnormal results are displayed) Labs Reviewed  BASIC METABOLIC PANEL - Abnormal; Notable for the following components:      Result Value   Sodium 133 (*)    Potassium 3.3 (*)    CO2 21 (*)    Glucose, Bld 142 (*)    Creatinine, Ser 1.14 (*)    Calcium 8.6 (*)    GFR calc non Af Amer 53 (*)    All other components within normal limits  CBC - Abnormal; Notable for the following components:    Hemoglobin 11.2 (*)    MCH 25.5 (*)    MCHC 29.4 (*)    RDW 17.6 (*)    All other components within normal limits  I-STAT TROPONIN, ED  I-STAT TROPONIN, ED    EKG  EKG Interpretation None       Radiology Dg Chest 2 View  Result Date: 12/08/2016 CLINICAL DATA:  Patient with intermittent chest pain. EXAM: CHEST  2 VIEW COMPARISON:  Chest radiograph 11/11/2016. FINDINGS: Stable cardiomegaly. Stable hilar prominence bilaterally. Stable interstitial opacities mid and upper lungs. Low lung volumes. Bibasilar heterogeneous pulmonary opacities. No pleural effusion or pneumothorax. Thoracic spine degenerative changes. IMPRESSION: Low lung volumes with basilar atelectasis. Cardiomegaly. Re- demonstrated hilar adenopathy and pulmonary parenchymal findings most compatible with history of sarcoid. Electronically Signed   By: Lovey Newcomer M.D.   On: 12/08/2016 21:06    Procedures Procedures (including critical care time)  Medications Ordered in ED Medications - No data to display   Initial Impression / Assessment and Plan / ED Course  I have reviewed the triage vital signs and the nursing notes.  Pertinent labs & imaging results that were available during my care of the patient were reviewed by me and considered in my medical decision making (see chart for details).     Pt is a 56 y.o. female with pertinent PMHx of DM, HTN, Sarcoidosis on 2L O2 who presents with CP and nausea that resolved  with nitro. Pain sharp and sounds atypical but pt has risk factors for ACS. Hx of DVTs but on xarelto and is compliant.  ED ECG showing NSR with no ST or T wave changes or signs of dysrhythmia.    Likely musculoskeletal.    Because of the age and risk factors of the patient, ACS will be ruled out with x2 serial troponins. Patient placed on telemetry. ASA 325mg  and NO given. CXR ordered. EKG performed and personally reviewed by myself which showed NSR with rate of 85 and no ST elevation, ST/T wave  changes, or signs of dysrhythmias. [id number]. EKG reviewed by myself and the attending.  Unlikely PNA as CXR with no signs of acute change, no leukocytosis, no cough, no fever. Unlikely PE as atypical presentation and on xarelto and is compliant, doubt aortic dissection, pancreatitis, arrhythmia, pneumothorax, endo/myo/pericarditis, shingles, emergent complications of an ulcer, esophageal pathology, or other emergent pathology.  Labs and imaging reviewed by myself and considered in medical decision making if ordered. Imaging interpreted by radiology.  Had a long discussion with the patient about admission for stress testing versus discharge home with PCP follow-up for further evaluation of CAD.  Patient states that she does not want to be admitted and she will schedule an appointment with her PCP on Monday or Tuesday.  Although she has risk factors, 3-hour troponin is negative and pain is atypical, I believe that discharge is reasonable with strict return precautions.  Patient was amenable with this plan.   Final Clinical Impressions(s) / ED Diagnoses   Final diagnoses:  Chest pain, unspecified type    ED Discharge Orders    None       Correll Denbow [place], MD 12/09/16 0370    Elnora Morrison, MD 12/09/16 [id number]

## 2016-12-08 NOTE — ED Notes (Signed)
edp at bedside at this time...delay in lab draw.

## 2016-12-09 ENCOUNTER — Other Ambulatory Visit: Payer: Self-pay | Admitting: Internal Medicine

## 2016-12-09 DIAGNOSIS — I2782 Chronic pulmonary embolism: Secondary | ICD-10-CM | POA: Diagnosis not present

## 2016-12-09 DIAGNOSIS — M069 Rheumatoid arthritis, unspecified: Secondary | ICD-10-CM | POA: Diagnosis not present

## 2016-12-09 DIAGNOSIS — I82421 Acute embolism and thrombosis of right iliac vein: Secondary | ICD-10-CM | POA: Diagnosis not present

## 2016-12-09 DIAGNOSIS — R072 Precordial pain: Secondary | ICD-10-CM | POA: Diagnosis not present

## 2016-12-09 DIAGNOSIS — E1142 Type 2 diabetes mellitus with diabetic polyneuropathy: Secondary | ICD-10-CM | POA: Diagnosis not present

## 2016-12-09 DIAGNOSIS — M797 Fibromyalgia: Secondary | ICD-10-CM | POA: Diagnosis not present

## 2016-12-09 DIAGNOSIS — D869 Sarcoidosis, unspecified: Secondary | ICD-10-CM | POA: Diagnosis not present

## 2016-12-09 LAB — I-STAT TROPONIN, ED: Troponin i, poc: 0 ng/mL (ref 0.00–0.08)

## 2016-12-09 NOTE — Discharge Instructions (Signed)
Please return with worsening chest pain, shortness of breath, dizziness, lightheadedness or any other concerning symptoms. Please follow up with your PCP at next available appointment as you may need further testing for your heart.

## 2016-12-10 ENCOUNTER — Other Ambulatory Visit: Payer: Self-pay | Admitting: Internal Medicine

## 2016-12-10 ENCOUNTER — Ambulatory Visit
Admission: RE | Admit: 2016-12-10 | Discharge: 2016-12-10 | Disposition: A | Discharge status: Home / Self Care | Payer: Medicare Other | Source: Ambulatory Visit | Attending: Internal Medicine | Admitting: Internal Medicine

## 2016-12-10 DIAGNOSIS — Z1231 Encounter for screening mammogram for malignant neoplasm of breast: Secondary | ICD-10-CM

## 2016-12-11 ENCOUNTER — Ambulatory Visit: Payer: Self-pay | Admitting: Pharmacist

## 2016-12-13 ENCOUNTER — Ambulatory Visit: Payer: Self-pay | Admitting: Pharmacist

## 2016-12-13 DIAGNOSIS — I2782 Chronic pulmonary embolism: Secondary | ICD-10-CM | POA: Diagnosis not present

## 2016-12-13 DIAGNOSIS — M797 Fibromyalgia: Secondary | ICD-10-CM | POA: Diagnosis not present

## 2016-12-13 DIAGNOSIS — E1142 Type 2 diabetes mellitus with diabetic polyneuropathy: Secondary | ICD-10-CM | POA: Diagnosis not present

## 2016-12-13 DIAGNOSIS — D869 Sarcoidosis, unspecified: Secondary | ICD-10-CM | POA: Diagnosis not present

## 2016-12-13 DIAGNOSIS — I82421 Acute embolism and thrombosis of right iliac vein: Secondary | ICD-10-CM | POA: Diagnosis not present

## 2016-12-13 DIAGNOSIS — M069 Rheumatoid arthritis, unspecified: Secondary | ICD-10-CM | POA: Diagnosis not present

## 2016-12-17 ENCOUNTER — Encounter: Payer: Self-pay | Admitting: Internal Medicine

## 2016-12-17 ENCOUNTER — Ambulatory Visit (INDEPENDENT_AMBULATORY_CARE_PROVIDER_SITE_OTHER)
Admission: RE | Admit: 2016-12-17 | Discharge: 2016-12-17 | Disposition: A | Discharge status: Home / Self Care | Payer: Medicare Other | Source: Ambulatory Visit | Attending: Internal Medicine | Admitting: Internal Medicine

## 2016-12-17 ENCOUNTER — Ambulatory Visit (INDEPENDENT_AMBULATORY_CARE_PROVIDER_SITE_OTHER): Payer: Medicare Other | Admitting: Internal Medicine

## 2016-12-17 VITALS — BP 116/78 | HR 87 | Temp 98.9°F | Ht 67.0 in | Wt 306.0 lb

## 2016-12-17 DIAGNOSIS — E538 Deficiency of other specified B group vitamins: Secondary | ICD-10-CM | POA: Diagnosis not present

## 2016-12-17 DIAGNOSIS — E118 Type 2 diabetes mellitus with unspecified complications: Secondary | ICD-10-CM

## 2016-12-17 DIAGNOSIS — M545 Low back pain, unspecified: Secondary | ICD-10-CM | POA: Insufficient documentation

## 2016-12-17 DIAGNOSIS — R102 Pelvic and perineal pain: Secondary | ICD-10-CM | POA: Diagnosis not present

## 2016-12-17 DIAGNOSIS — R55 Syncope and collapse: Secondary | ICD-10-CM

## 2016-12-17 DIAGNOSIS — S3993XA Unspecified injury of pelvis, initial encounter: Secondary | ICD-10-CM | POA: Diagnosis not present

## 2016-12-17 DIAGNOSIS — S3992XA Unspecified injury of lower back, initial encounter: Secondary | ICD-10-CM | POA: Diagnosis not present

## 2016-12-17 MED ORDER — CYANOCOBALAMIN 1000 MCG/ML IJ SOLN
1000.0000 ug | Freq: Once | INTRAMUSCULAR | Status: AC
Start: 2016-12-17 — End: 2016-12-17
  Administered 2016-12-17: 1000 ug via INTRAMUSCULAR

## 2016-12-17 MED ORDER — HYDROCODONE-ACETAMINOPHEN 7.5-325 MG PO TABS: 1.0000 | ORAL_TABLET | ORAL | 0 refills | Status: DC | PRN

## 2016-12-17 NOTE — Assessment & Plan Note (Signed)
s/p fall - acute on chronic: contusions X ray pelvis and LS spine Pain meds

## 2016-12-17 NOTE — Assessment & Plan Note (Signed)
No relapse 

## 2016-12-17 NOTE — Assessment & Plan Note (Signed)
On Metformin, Januvia 

## 2016-12-17 NOTE — Progress Notes (Signed)
Subjective:  Patient ID: [name], female    DOB: [birth year]  Age: 56 y.o. MRN: [id number]  CC: No chief complaint on file.   HPI [name] presents for a fall on Fri: she bent over and passed out, then recovered on the floor. C/o LBP and a coccyx pain. She was in the ER on 12/08/16 for a CP attack. EKG/CXR. ?MSK pain...  Outpatient Medications Prior to Visit  Medication Sig Dispense Refill  . Adalimumab (HUMIRA) 40 MG/0.8ML PSKT Inject 40 mg into the skin every 14 (fourteen) days.    . budesonide-formoterol (SYMBICORT) 160-4.5 MCG/ACT inhaler Inhale 2 puffs into the lungs 2 (two) times daily. 1 Inhaler 0  . carvedilol (COREG) 12.5 MG tablet TAKE 1 TABLET BY MOUTH TWICE DAILY WITH A MEAL 180 tablet 1  . chlorhexidine (PERIDEX) 0.12 % solution Use as directed 5 mLs in the mouth or throat 2 (two) times daily. Pt is to swish and spit. 120 mL 0  . diclofenac sodium (VOLTAREN) 1 % GEL Apply 2 g topically 2 (two) times daily as needed (for pain). 100 g 2  . esomeprazole (NEXIUM) 40 MG capsule Take 1 capsule (40 mg total) by mouth daily before breakfast. 90 capsule 1  . furosemide (LASIX) 40 MG tablet TAKE 1 TABLET BY MOUTH EVERY MORNING FOR EDEMA (Patient taking differently: Take 40 mg by mouth daily. TAKE 1 TABLET BY MOUTH EVERY MORNING FOR EDEMA) 90 tablet 1  . gabapentin (NEURONTIN) 300 MG capsule Take 1 capsule (300 mg total) by mouth 2 (two) times daily as needed (for pain). 180 capsule 1  . lidocaine (XYLOCAINE) 2 % solution Use as directed 20 mLs in the mouth or throat 3 (three) times daily as needed for mouth pain. Pt is to swish and spit.    . metFORMIN (GLUCOPHAGE-XR) 500 MG 24 hr tablet TAKE 2 TABLETS(1000 MG) BY MOUTH DAILY WITH BREAKFAST (Patient taking differently: Take 1,000 mg by mouth daily with breakfast. TAKE 2 TABLETS(1000 MG) BY MOUTH DAILY WITH BREAKFAST) 180 tablet 0  . nortriptyline (PAMELOR) 75 MG capsule Take 1 capsule (75 mg total) by mouth at  bedtime. 90 capsule 0  . potassium chloride (KLOR-CON 10) 10 MEQ tablet Take 1 tablet (10 mEq total) by mouth daily. 90 tablet 3  . sitaGLIPtin (JANUVIA) 50 MG tablet Take 1 tablet (50 mg total) by mouth daily. 90 tablet 3  . Suvorexant (BELSOMRA) 20 MG TABS Take 1 tablet by mouth at bedtime. 90 tablet 0  . topiramate (TOPAMAX) 50 MG tablet Take 1 tablet (50 mg total) by mouth 2 (two) times daily. 180 tablet 1  . venlafaxine XR (EFFEXOR-XR) 75 MG 24 hr capsule Take 1 capsule (75 mg total) by mouth daily with breakfast. 30 capsule 0  . VENTOLIN HFA 108 (90 Base) MCG/ACT inhaler Inhale 2 puffs into the lungs every 6 (six) hours as needed for wheezing or shortness of breath. 200/8=25 18 g 11  . XARELTO 20 MG TABS tablet Take 1 tablet (20 mg total) by mouth daily. with food 90 tablet 0   No facility-administered medications prior to visit.     ROS Review of Systems  Constitutional: Positive for fatigue. Negative for activity change, appetite change, chills and unexpected weight change.  HENT: Negative for congestion, mouth sores and sinus pressure.   Eyes: Negative for visual disturbance.  Respiratory: Positive for shortness of breath. Negative for cough and chest tightness.   Cardiovascular: Positive for chest pain.  Gastrointestinal: Negative  for abdominal pain and nausea.  Genitourinary: Positive for pelvic pain. Negative for difficulty urinating, frequency and vaginal pain.  Musculoskeletal: Positive for arthralgias, back pain, gait problem and neck pain.  Skin: Positive for rash. Negative for pallor.  Neurological: Positive for weakness and light-headedness. Negative for dizziness, tremors, numbness and headaches.  Psychiatric/Behavioral: Positive for decreased concentration, dysphoric mood and sleep disturbance. Negative for confusion and suicidal ideas. The patient is nervous/anxious.     Objective:  BP 116/78 (BP Location: Right Arm, Patient Position: Sitting, Cuff Size: Large)    Pulse 87   Temp 98.9 F (37.2 C) (Oral)   Ht 5\' 7"  (1.702 m)   Wt (!) 306 lb (138.8 kg)   SpO2 98%   BMI 47.93 kg/m   BP Readings from Last 3 Encounters:  12/17/16 116/78  12/09/16 111/60  11/11/16 124/64    Wt Readings from Last 3 Encounters:  12/17/16 (!) 306 lb (138.8 kg)  12/08/16 (!) 315 lb (142.9 kg)  11/11/16 (!) 321 lb 9.6 oz (145.9 kg)    Physical Exam  Constitutional: She appears well-developed. No distress.  HENT:  Head: Normocephalic.  Right Ear: External ear normal.  Left Ear: External ear normal.  Nose: Nose normal.  Mouth/Throat: Oropharynx is clear and moist.  Eyes: Conjunctivae are normal. Pupils are equal, round, and reactive to light. Right eye exhibits no discharge. Left eye exhibits no discharge.  Neck: Normal range of motion. Neck supple. No JVD present. No tracheal deviation present. No thyromegaly present.  Cardiovascular: Normal rate, regular rhythm and normal heart sounds.  Pulmonary/Chest: No stridor. No respiratory distress. She has no wheezes.  Abdominal: Soft. Bowel sounds are normal. She exhibits no distension and no mass. There is no tenderness. There is no rebound and no guarding.  Musculoskeletal: She exhibits tenderness. She exhibits no edema.  Lymphadenopathy:    She has no cervical adenopathy.  Neurological: She displays normal reflexes. No cranial nerve deficit. She exhibits normal muscle tone. Coordination abnormal.  Skin: No rash noted. No erythema.  Psychiatric: She has a normal mood and affect. Her behavior is normal. Judgment and thought content normal.  Obese Walker LS and coccyx - very painful  Lab Results  Component Value Date   WBC 8.7 12/08/2016   HGB 11.2 (L) 12/08/2016   HCT 38.1 12/08/2016   PLT 223 12/08/2016   GLUCOSE 142 (H) 12/08/2016   CHOL 193 06/29/2008   TRIG 145.0 06/29/2008   HDL 67.50 06/29/2008   LDLCALC 97 06/29/2008   ALT 15 08/23/2016   AST 16 08/23/2016   NA 133 (L) 12/08/2016   K 3.3 (L)  12/08/2016   CL 101 12/08/2016   CREATININE 1.14 (H) 12/08/2016   BUN 13 12/08/2016   CO2 21 (L) 12/08/2016   TSH 2.31 08/23/2016   INR 1.07 07/22/2016   HGBA1C 7.1 (H) 07/22/2016   MICROALBUR <0.7 02/01/2015    Mm Digital Screening Bilateral  Result Date: 12/10/2016 CLINICAL DATA:  Screening. EXAM: DIGITAL SCREENING BILATERAL MAMMOGRAM WITH CAD COMPARISON:  Previous exam(s). ACR Breast Density Category a: The breast tissue is almost entirely fatty. FINDINGS: There are no findings suspicious for malignancy. Images were processed with CAD. IMPRESSION: No mammographic evidence of malignancy. A result letter of this screening mammogram will be mailed directly to the patient. RECOMMENDATION: Screening mammogram in one year. (Code:SM-B-01Y) BI-RADS CATEGORY  1: Negative. Electronically Signed   By: Franki Cabot M.D.   On: 12/10/2016 16:34    Assessment & Plan:  There are no diagnoses linked to this encounter. I am having [name] maintain her lidocaine, Adalimumab, sitaGLIPtin, potassium chloride, topiramate, carvedilol, gabapentin, diclofenac sodium, furosemide, chlorhexidine, XARELTO, nortriptyline, Suvorexant, metFORMIN, esomeprazole, budesonide-formoterol, venlafaxine XR, and VENTOLIN HFA.  No orders of the defined types were placed in this encounter.    Follow-up: No Follow-up on file.  Walker Kehr, MD

## 2016-12-18 ENCOUNTER — Other Ambulatory Visit: Payer: Self-pay | Admitting: Internal Medicine

## 2016-12-18 ENCOUNTER — Ambulatory Visit: Payer: Self-pay | Admitting: Pharmacist

## 2016-12-19 ENCOUNTER — Encounter: Payer: Self-pay | Admitting: *Deleted

## 2016-12-19 ENCOUNTER — Telehealth: Payer: Self-pay | Admitting: Internal Medicine

## 2016-12-19 ENCOUNTER — Other Ambulatory Visit: Payer: Self-pay | Admitting: General Practice

## 2016-12-19 ENCOUNTER — Other Ambulatory Visit: Payer: Self-pay | Admitting: Pharmacist

## 2016-12-19 MED ORDER — VENLAFAXINE HCL ER 75 MG PO CP24: 75.0000 mg | ORAL_CAPSULE | Freq: Every day | ORAL | 0 refills | Status: DC

## 2016-12-19 MED ORDER — CHLORHEXIDINE GLUCONATE 0.12 % MT SOLN: 5.0000 mL | Freq: Two times a day (BID) | OROMUCOSAL | 0 refills | Status: DC

## 2016-12-19 MED ORDER — TOPIRAMATE 50 MG PO TABS: 50.0000 mg | ORAL_TABLET | Freq: Two times a day (BID) | ORAL | 0 refills | Status: DC

## 2016-12-19 MED ORDER — DICLOFENAC SODIUM 1 % TD GEL: 2.0000 g | Freq: Two times a day (BID) | TRANSDERMAL | 0 refills | Status: DC | PRN

## 2016-12-19 MED ORDER — XARELTO 20 MG PO TABS: 20.0000 mg | ORAL_TABLET | Freq: Every day | ORAL | 0 refills | Status: DC

## 2016-12-19 NOTE — Patient Outreach (Signed)
[facility]) Care Management  [place]   12/19/2016  Keelia Graybill [facility] Apr 09, 1960 [id number]  Subjective: Patient was called to follow up on medication management. HIPAA identifiers were obtained.  Patient is a 56 year old female with multiple medical conditions including but not limited to: sarcoidosis, on Humira, morbid obesity, type 2 diabetes mellitus, depression, B 12 deficiency, vitamin d deficiency, depression, insomnia, OSA and GERD.   Patient reported going to the emergency room due to chest pain (ruled muscluoskeletal) on 12/08/16.  She also reported falling yesterday 12/18/16.  She had a follow up with her PCP on 12/17/16 and was prescribed Hydrocodone/APAp 7.5/325 for pain.    Objective:   Encounter Medications: Outpatient Encounter Medications as of 12/19/2016  Medication Sig Note  . Adalimumab (HUMIRA) 40 MG/0.8ML PSKT Inject 40 mg into the skin every 14 (fourteen) days.   . budesonide-formoterol (SYMBICORT) 160-4.5 MCG/ACT inhaler Inhale 2 puffs into the lungs 2 (two) times daily.   . carvedilol (COREG) 12.5 MG tablet TAKE 1 TABLET BY MOUTH TWICE DAILY WITH A MEAL   . chlorhexidine (PERIDEX) 0.12 % solution Use as directed 5 mLs in the mouth or throat 2 (two) times daily. Pt is to swish and spit. 12/19/2016: Is only doing this PRN  . diclofenac sodium (VOLTAREN) 1 % GEL Apply 2 g topically 2 (two) times daily as needed (for pain).   [name] esomeprazole (NEXIUM) 40 MG capsule Take 1 capsule (40 mg total) by mouth daily before breakfast.   . furosemide (LASIX) 40 MG tablet TAKE 1 TABLET BY MOUTH EVERY MORNING FOR EDEMA (Patient taking differently: Take 40 mg by mouth daily. TAKE 1 TABLET BY MOUTH EVERY MORNING FOR EDEMA)   . gabapentin (NEURONTIN) 300 MG capsule Take 1 capsule (300 mg total) by mouth 2 (two) times daily as needed (for pain).   [name] HYDROcodone-acetaminophen (NORCO) 7.5-325 MG tablet Take 1 tablet by mouth every 4 (four) hours as needed for  moderate pain or severe pain.   [name] lidocaine (XYLOCAINE) 2 % solution Use as directed 20 mLs in the mouth or throat 3 (three) times daily as needed for mouth pain. Pt is to swish and spit. 12/19/2016: PRN Only  . metFORMIN (GLUCOPHAGE-XR) 500 MG 24 hr tablet TAKE 2 TABLETS(1000 MG) BY MOUTH DAILY WITH BREAKFAST (Patient taking differently: Take 1,000 mg by mouth daily with breakfast. TAKE 2 TABLETS(1000 MG) BY MOUTH DAILY WITH BREAKFAST)   . nortriptyline (PAMELOR) 75 MG capsule Take 1 capsule (75 mg total) by mouth at bedtime.   . potassium chloride (KLOR-CON 10) 10 MEQ tablet Take 1 tablet (10 mEq total) by mouth daily.   . sitaGLIPtin (JANUVIA) 50 MG tablet Take 1 tablet (50 mg total) by mouth daily.   . Suvorexant (BELSOMRA) 20 MG TABS Take 1 tablet by mouth at bedtime.   . topiramate (TOPAMAX) 50 MG tablet Take 1 tablet (50 mg total) by mouth 2 (two) times daily.   [name] venlafaxine XR (EFFEXOR-XR) 75 MG 24 hr capsule Take 1 capsule (75 mg total) by mouth daily with breakfast.   . VENTOLIN HFA 108 (90 Base) MCG/ACT inhaler Inhale 2 puffs into the lungs every 6 (six) hours as needed for wheezing or shortness of breath. 200/8=25   . XARELTO 20 MG TABS tablet Take 1 tablet (20 mg total) by mouth daily. with food    No facility-administered encounter medications on file as of 12/19/2016.     Functional Status: In your present state of health,  do you have any difficulty performing the following activities: 11/06/2016 10/24/2016  Hearing? N N  Vision? Y N  Comment - -  Difficulty concentrating or making decisions? Y N  Comment - -  Walking or climbing stairs? Y Y  Dressing or bathing? N N  Doing errands, shopping? [name]  Preparing Food and eating ? N N  Using the Toilet? N N  In the past six months, have you accidently leaked urine? N Y  Do you have problems with loss of bowel control? N N  Managing your Medications? N N  Managing your Finances? N N  Housekeeping or managing your Housekeeping? Y  Y  Some recent data might be hidden    Fall/Depression Screening: Fall Risk  12/19/2016 11/06/2016 10/24/2016  Falls in the past year? Yes Yes No  Number falls in past yr: 2 or more 2 or more -  Injury with Fall? Yes - -  Risk Factor Category  - - -  Risk for fall due to : Impaired mobility Impaired mobility;Impaired balance/gait Impaired balance/gait;Impaired mobility  Follow up Falls prevention discussed Falls prevention discussed;Education provided -   [facility] 2/9 Scores 11/06/2016 10/24/2016 07/08/2016 06/26/2016 04/04/2016  PHQ - 2 Score 2 0 1 1 1   PHQ- 9 Score 7 - - - -      Assessment:  Patient's medications were reviewed via telephone.  Drugs sorted by system:  Neurologic/Psychologic: Venlafaxine XR Belsomra Nortriptyline Topiramate Gabapentin  Cardiovascular: Carvedilol Furosemide Xarelto   Pulmonary/Allergy: Albuterol HFA  Gastrointestinal: esomeprazole  Endocrine: Januvia Metformin XR  Topical: Fluocinonide gel Voltaren gel   Pain: Tramadol  Autoimmune Humira  Vitamins/Minerals: Potassium  Miscellaneous: Chlorhexidine Lidocaine Solution  Medication Review Findings: Bellsomra-patient reported she ran out of Bellsomra and did not have a tablet to take last night. She said she would call [facility] to refill.  Plan: Refer patient to social work for [employer].  Call patient back in 2 weeks. Consider closing case. Consult Community Nurse, Tish Men  Elayne Guerin, PharmD, [place] Clinical Pharmacist [phone]

## 2016-12-19 NOTE — Telephone Encounter (Signed)
Copied from [place]. Topic: Quick Communication - See Telephone Encounter >> Dec 19, 2016  3:54 PM Robina Ade, Helene Kelp D wrote: CRM for notification. See Telephone encounter for: 12/19/16. Patient called and wanted to let the provider know that she forgot to tell  him on her visit that anything she holds on her hand she drops, she started stuttering, and fell off the bed last nigh. She is concern and would like to talk to someone about it because she feels that it may be her meds that caused these issues.

## 2016-12-19 NOTE — Addendum Note (Signed)
Addended by: Elayne Guerin on: 12/19/2016 10:56 AM   Modules accepted: Orders

## 2016-12-20 ENCOUNTER — Telehealth: Payer: Self-pay | Admitting: Adult Health

## 2016-12-20 ENCOUNTER — Other Ambulatory Visit: Payer: Self-pay

## 2016-12-20 ENCOUNTER — Other Ambulatory Visit: Payer: Self-pay | Admitting: Pharmacist

## 2016-12-20 ENCOUNTER — Encounter: Payer: Self-pay | Admitting: Pharmacist

## 2016-12-20 NOTE — Patient Outreach (Signed)
[name]) Care Management  12/20/2016  [name] Mar 24, 1960 [id number]  Patient called me today stating she did not feel well due to the fall she suffered out of her bed on Wednesday. HIPAA identifiers were obtained. She reported that she called Dr. Judeen Hammans office as I instructed her to do yesterday but had not heard back from them.   She said she was not experiencing chest pain, head ache, nausea, vomiting or arm pain. She did say she was having trouble holding on to things and keeps dropping things.    A message was left on the patient's behalf at Dr. Judeen Hammans office and I requested they call the patient back.    Patient was called back to check on her at 4:06pm. She said someone from Dr. Judeen Hammans office called her this afternoon and said they would get back with her later.   Elayne Guerin, PharmD, [place] Clinical Pharmacist [phone]

## 2016-12-20 NOTE — Telephone Encounter (Unsigned)
Copied from [place] [phone]. Topic: Inquiry >> Dec 20, 2016 10:50 AM Scherrie Gerlach wrote: Reason for CRM: Denyse Amass  with [facility] / pharmacist called to advise she was to go see pt today. But pt cancelled because she fell out of bed. She was to let the dr now. Also pt would like a call back from the dr.

## 2016-12-20 NOTE — Telephone Encounter (Signed)
Noted Pls stop Nortriptyline Thx

## 2016-12-20 NOTE — Telephone Encounter (Signed)
Patient states she is constantly dropping thing for about 1.5 months, stuttering off and on, and fell off the bed Wednesday when she was awake. Patient is very concerned about all of this going on. Please advise

## 2016-12-20 NOTE — Patient Outreach (Unsigned)
[facility]) Care Management 12/20/16  [name] 20-Jul-1960 [id number]  Successful outreach completed with patient. Patient identification verified.  Patient reported that she has fallen twice since our last outreach. She stated that she was sitting in kitchen and began having chest pain. She stated that it went away and came back so she called EMS. EMS recommended that she go to the ER so she did. She stated that it was musculoskeletal.  Has been unpacking boxes since moving back in apartment. Stood up in the kitchen to pick up a hammer when unpacking boxes, and just fell. Stated that she hurt her back and bottom   Did not go to the hospital because she had a PCP appointment with her PCP this Tuesday. Told them about the fall and he was trying to examine her and she was hollering that he cannot touch her. He sent her downstairs for some xrays for pelvic bone and lower back. Nothing was broken. May be bruised on the inside. He prescribed her hydrocodone 7.5 mg (you know it is not strong). Take every 4 hours and it should help with the pain. Stated that she is used to being in pain and not having pain medicine so she has been forgetting to take it. Showed her how much he had to go through to prescribe her 5 days of pain medicine.   Wednesday evening, she was laying in the bed. Reached over to get water bottle off table and when she did, she kept rolling and fell in the floor. Complains of left hand, left side of back.  SW referral was done by [facility] pharmacist for advance directives. RNCM sent inbasket message to Di Kindle to let he rknow that patient also needs financial assistance as she expressed concerns about ability to pay her bills.

## 2016-12-20 NOTE — Telephone Encounter (Signed)
Pt brought forms for TP to sign: SCAT application and [employer] priority list  Filled out and signed by TP However, they were not signed by patient Also, TP wanted patient to have PFTs at next ov  Pt is scheduled to see RA on 12.17.18 at 1015 There is a PFT opening at 0900, pt is scheduled for this time  Called spoke with patient, she is aware forms were not signed - she will pick up at her next ov Forms placed up front in [place] folder Pt is aware of the pending PFT appt  Copies of forms placed in scan folder Nothing further needed; will sign off

## 2016-12-25 NOTE — Telephone Encounter (Signed)
LM notifying pt

## 2016-12-26 ENCOUNTER — Ambulatory Visit: Payer: Self-pay

## 2016-12-30 ENCOUNTER — Ambulatory Visit (INDEPENDENT_AMBULATORY_CARE_PROVIDER_SITE_OTHER): Payer: Medicare Other | Admitting: Pulmonary Disease

## 2016-12-30 ENCOUNTER — Other Ambulatory Visit: Payer: Self-pay | Admitting: *Deleted

## 2016-12-30 ENCOUNTER — Encounter: Payer: Self-pay | Admitting: Pulmonary Disease

## 2016-12-30 ENCOUNTER — Other Ambulatory Visit: Payer: Self-pay | Admitting: Adult Health

## 2016-12-30 DIAGNOSIS — J449 Chronic obstructive pulmonary disease, unspecified: Secondary | ICD-10-CM | POA: Diagnosis not present

## 2016-12-30 DIAGNOSIS — J9611 Chronic respiratory failure with hypoxia: Secondary | ICD-10-CM

## 2016-12-30 DIAGNOSIS — D869 Sarcoidosis, unspecified: Secondary | ICD-10-CM

## 2016-12-30 LAB — PULMONARY FUNCTION TEST
DL/VA % pred: 66 %
DL/VA: 3.4 ml/min/mmHg/L
DLCO COR % PRED: 40 %
DLCO COR: 11.34 ml/min/mmHg
DLCO unc % pred: 41 %
DLCO unc: 11.74 ml/min/mmHg
FEF 25-75 POST: 1.14 L/s
FEF 25-75 Pre: 1.04 L/sec
FEF2575-%Change-Post: 10 %
FEF2575-%PRED-POST: 42 %
FEF2575-%PRED-PRE: 38 %
FEV1-%Change-Post: 2 %
FEV1-%PRED-POST: 50 %
FEV1-%Pred-Pre: 49 %
FEV1-POST: 1.49 L
FEV1-Pre: 1.46 L
FEV1FVC-%CHANGE-POST: -2 %
FEV1FVC-%PRED-PRE: 89 %
FEV6-%CHANGE-POST: 3 %
FEV6-%PRED-PRE: 56 %
FEV6-%Pred-Post: 58 %
FEV6-Post: 2.13 L
FEV6-Pre: 2.07 L
FEV6FVC-%Pred-Post: 103 %
FEV6FVC-%Pred-Pre: 103 %
FVC-%Change-Post: 5 %
FVC-%PRED-POST: 57 %
FVC-%Pred-Pre: 54 %
FVC-PRE: 2.07 L
FVC-Post: 2.18 L
POST FEV1/FVC RATIO: 68 %
POST FEV6/FVC RATIO: 100 %
PRE FEV1/FVC RATIO: 70 %
Pre FEV6/FVC Ratio: 100 %
RV % pred: 92 %
RV: 1.92 L
TLC % PRED: 74 %
TLC: 4.07 L

## 2016-12-30 NOTE — Patient Outreach (Signed)
[facility]) Care Management  12/30/2016  [name] Jul 13, 1960 [id number]   CSW made an initial attempt to try and contact patient today to perform phone assessment, as well as assess and assist with social needs and services, without success.  A HIPAA compliant message was left for patient on voicemail.  CSW is currently awaiting a return call. CSW will make a second outreach attempt within the next week, if CSW does not receive a return call from patient in the meantime. [name], BSW, MSW, LCSW  Licensed Education officer, [facility]  Mailing [place] [address] [place], [place] [address] Physical Address-[address], [place], [place] [address] Toll Free Main # [phone] Fax # [phone] Cell # [phone]  Office # [phone] [email or web address]

## 2016-12-30 NOTE — Assessment & Plan Note (Signed)
Repeat PFTs today with mild obstruction, no bronchodilator response. Reduced DLCO 40%. Symptoms are well controlled on BID advair and as needed albuterol. Planning to start pulmonary rehab in the near future. Follow up 3 months.

## 2016-12-30 NOTE — Assessment & Plan Note (Signed)
Cutaneous lesions are improving on weekly humira prescribed by her dermatologist. Denies joint pain. No indications for steroids at this time. Follow up derm and rheumatology.

## 2016-12-30 NOTE — Patient Instructions (Addendum)
Flu shot today.   Please continue your Advair twice daily and albuterol as needed.   Please follow up with your dermatologist for Humira therapy.   Plan for pulmonary rehab.   Follow up 3 months.

## 2016-12-30 NOTE — Assessment & Plan Note (Signed)
Stable on 2L home oxygen.

## 2016-12-30 NOTE — Progress Notes (Signed)
PFT completed today 12/30/16

## 2016-12-30 NOTE — Progress Notes (Signed)
   Subjective:    Patient ID: [name], female    DOB: February 10, 1960, 56 y.o.   MRN: [id number]  HPI  56 year-old obese woman followed for pulmonary &skin sarcoidosis, lung nodules and provoked(surgery) PE June 2017  03/2016 Started on humiradue to recurring cutaneous lesions, not responding to methotrexate ,She is now on Humira every week and prednisone has been tapered to off, skin lesions are improving  Admitted 07/21/2016 for RLE DVT-had stopped Xarelto 04/2016 for prior pulmonary embolism  CT scan = chronic appearing subsegmental PE , is back on Xarelto now  She arrived in the office today with saturation of 98% on 2L home oxygen. She reports her breathing is stable.   Concerned with frequent falls that is being worked up by her PCP, recently discontinued nortriptyline. Complaining of diffuse right sided MSK pain.  Feels that her COPD symptoms are well controlled on BID advair. Reports using her rescue inhaler 6-7 times a week total, but not daily.  She is compliant with her xarelto her PEs.   Feels that her cutaneous sarcoid lesions are improving on humira prescribed by her dermatologist.    Significant tests/ events  PFT 05/2014 >> FVC 65%, ratio 69, FEV1 58%, DLCO 62%  VQ Scan 08/17/15 >>LLL &RUL acute PE   ECHO 08/18/15 >>mild LVH , EF 55-60%, Gr 1 DD .   LE Venous Doppler 08/18/15 >>neg DVT , no bakers cyst, ?prominent inguinal lymph node measuring 3.1cm   MRI brain neg [facility] = MRI spine = mild to moderate disc protrusion/left-sided spinal stenosis-chronic pain  NPSG 12/2004 mild, RDI 13/h  12/30/16 PFTs >>  FEV1/FVC ratio 70, FEV1 49%, DLCO 40%  Review of Systems neg for any significant sore throat, dysphagia, itching, sneezing, nasal congestion or excess/ purulent secretions, fever, chills, sweats, unintended wt loss, pleuritic or exertional cp, hempoptysis, orthopnea pnd or change in chronic leg swelling. Also denies presyncope,  palpitations, heartburn, abdominal pain, nausea, vomiting, diarrhea or change in bowel or urinary habits, dysuria,hematuria, rash, arthralgias, visual complaints, headache, numbness weakness or ataxia.     Objective:   Physical Exam   Gen. Pleasant, obese, in no distress ENT - no lesions, no post nasal drip Neck: No JVD, no thyromegaly, no carotid bruits Lungs: no use of accessory muscles, no dullness to percussion, decreased without rales or rhonchi  Cardiovascular: Rhythm regular, heart sounds  normal, no murmurs or gallops, 2+ BL peripheral edema Musculoskeletal: No deformities, no cyanosis or clubbing , no tremors

## 2017-01-01 ENCOUNTER — Telehealth: Payer: Self-pay | Admitting: Pulmonary Disease

## 2017-01-01 ENCOUNTER — Other Ambulatory Visit: Payer: Self-pay | Admitting: Internal Medicine

## 2017-01-01 NOTE — Telephone Encounter (Signed)
Called spoke with Corene Cornea w/ [facility] for clarification Patient called [facility] today because she needs her backup tanks replaced Patient just needs to call back and tell them this  Called spoke with patient, asked her if it's her backup tanks that she is needing > pt stated "Yes" and that she had forgotten the name of it She will call [facility] and request replacement backup tanks  Nothing further needed; will sign off

## 2017-01-02 ENCOUNTER — Telehealth: Payer: Self-pay | Admitting: Internal Medicine

## 2017-01-02 ENCOUNTER — Telehealth (HOSPITAL_COMMUNITY): Payer: Self-pay

## 2017-01-02 ENCOUNTER — Other Ambulatory Visit: Payer: Self-pay | Admitting: Pharmacist

## 2017-01-02 MED ORDER — HYDROCODONE-ACETAMINOPHEN 7.5-325 MG PO TABS: 1.0000 | ORAL_TABLET | ORAL | 0 refills | Status: DC | PRN

## 2017-01-02 NOTE — Telephone Encounter (Signed)
Attempted to call patient in regards to Pulmonary Rehab - Lm on Vm °

## 2017-01-02 NOTE — Telephone Encounter (Signed)
Done Use as little as possible Thx

## 2017-01-02 NOTE — Patient Outreach (Signed)
[facility]) Care Management  01/02/2017  [place] 09-Jul-1960 [id number]   Patient was called to follow up with her.  HIPAA identifiers were obtained.  Patient said she was not feeling well. She said she has continued to fall due to her back pain and leg weakness. Patient confirmed she was prescribed Hydrocodone/APAP 7.5/325 on 12/17/16 by Dr.Plotnikov.  She said she had one tablet left and was planning on calling Dr. Judeen Hammans office about calling in a new prescription.  Patient's medications were reviewed while on the phone: Medications Reviewed Today    Reviewed by Elayne Guerin, [facility] (Pharmacist) on 01/02/17 at 1403  Med List Status: <None>  Medication Order Taking? Sig Documenting Provider Last Dose Status Informant  Adalimumab (HUMIRA) 40 MG/0.8ML PSKT [id number] Yes Inject 40 mg into the skin every 14 (fourteen) days. [provider] Taking Active Self  budesonide-formoterol (SYMBICORT) 160-4.5 MCG/ACT inhaler [id number] Yes Inhale 2 puffs into the lungs 2 (two) times daily. Parrett, Fonnie Mu, NP Taking Active Self  carvedilol (COREG) 12.5 MG tablet [id number] Yes TAKE 1 TABLET BY MOUTH TWICE DAILY WITH A MEAL Plotnikov, Evie Lacks, MD Taking Active Self  chlorhexidine (PERIDEX) 0.12 % solution [id number] Yes Use as directed 5 mLs in the mouth or throat 2 (two) times daily. Pt is to swish and spit. Plotnikov, Evie Lacks, MD Taking Active   diclofenac sodium (VOLTAREN) 1 % GEL [id number] Yes Apply 2 g topically 2 (two) times daily as needed (for pain). Plotnikov, Evie Lacks, MD Taking Active   esomeprazole (NEXIUM) 40 MG capsule [id number] Yes Take 1 capsule (40 mg total) by mouth daily before breakfast. Plotnikov, Evie Lacks, MD Taking Active Self  furosemide (LASIX) 40 MG tablet [id number] Yes TAKE 1 TABLET BY MOUTH EVERY MORNING FOR EDEMA  Patient taking differently:  Take 40 mg by mouth daily. TAKE 1 TABLET BY MOUTH EVERY MORNING FOR EDEMA   Plotnikov,  Evie Lacks, MD Taking Active Self  gabapentin (NEURONTIN) 300 MG capsule [id number] Yes Take 1 capsule (300 mg total) by mouth 2 (two) times daily as needed (for pain). Plotnikov, Evie Lacks, MD Taking Active Self  HYDROcodone-acetaminophen (NORCO) 7.5-325 MG tablet [id number] Yes Take 1 tablet by mouth every 4 (four) hours as needed for moderate pain or severe pain. Plotnikov, Evie Lacks, MD Taking Active   lidocaine (XYLOCAINE) 2 % solution [id number] Yes Use as directed 20 mLs in the mouth or throat 3 (three) times daily as needed for mouth pain. Pt is to swish and spit. Zavalla, [place], DDS Taking Active Self           Med Note Regino Bellow Dec 19, 2016  9:32 AM) PRN Only  metFORMIN (GLUCOPHAGE-XR) 500 MG 24 hr tablet [id number] Yes TAKE 2 TABLETS(1000 MG) BY MOUTH DAILY WITH BREAKFAST  Patient taking differently:  Take 1,000 mg by mouth daily with breakfast. TAKE 2 TABLETS(1000 MG) BY MOUTH DAILY WITH Paticia Stack, MD Taking Active Self  potassium chloride (KLOR-CON 10) 10 MEQ tablet [id number] Yes Take 1 tablet (10 mEq total) by mouth daily. Plotnikov, Evie Lacks, MD Taking Active Self  sitaGLIPtin (JANUVIA) 50 MG tablet [id number] Yes Take 1 tablet (50 mg total) by mouth daily. Renato Shin, MD Taking Active Self  Suvorexant [facility]) 20 MG TABS [id number] Yes Take 1 tablet by mouth at bedtime. Plotnikov, Evie Lacks, MD Taking Active Self  topiramate (TOPAMAX) 50 MG tablet [id number] Yes Take 1 tablet (50 mg total) by  mouth 2 (two) times daily. Plotnikov, Evie Lacks, MD Taking Active         Discontinued 16/10/96 0454 (Duplicate)   venlafaxine XR (EFFEXOR-XR) 75 MG 24 hr capsule [id number] Yes Take 1 capsule (75 mg total) by mouth daily with breakfast. Plotnikov, Evie Lacks, MD Taking Active   VENTOLIN HFA 108 (90 Base) MCG/ACT inhaler [id number] Yes Inhale 2 puffs into the lungs every 6 (six) hours as needed for wheezing or shortness of breath. 200/8=25 Plotnikov, Evie Lacks, MD  Taking Active   XARELTO 20 MG TABS tablet [id number] Yes Take 1 tablet (20 mg total) by mouth daily. with food Plotnikov, Evie Lacks, MD Taking Active           Today's Vitals   01/02/17 1400  PainSc: 8     Plan: Call patient back in 5-7 business days. Patient will contact her PCP today for more pain medications.   Patient has been in contact with her provider about falls.   Elayne Guerin, PharmD, [place] Clinical Pharmacist [phone]

## 2017-01-02 NOTE — Telephone Encounter (Deleted)
Copied from [place] [id number]. Topic: Quick Communication - Rx Refill/Question >> Jan 02, 2017  2:19 PM Marin Olp L wrote: Has the patient contacted their pharmacy? Yes.   (Agent: If no, request that the patient contact the pharmacy for the refill.) Preferred Pharmacy (with phone number or street name): [facility]- [facility] [place], [place] - [address] Agent: Please be advised that RX refills may take up to 3 business days. We ask that you follow-up with your pharmacy. Refill HYDROcodone-acetaminophen (NORCO) 7.5-325 MG tablet

## 2017-01-02 NOTE — Telephone Encounter (Signed)
Sent to provider 

## 2017-01-02 NOTE — Telephone Encounter (Signed)
Copied from [place] [id number]. Topic: Quick Communication - Rx Refill/Question >> Jan 02, 2017  2:19 PM Marin Olp L wrote: Has the patient contacted their pharmacy? Yes.   (Agent: If no, request that the patient contact the pharmacy for the refill.) Preferred Pharmacy (with phone number or street name): [facility]- [facility] [place], [place] - [address] Agent: Please be advised that RX refills may take up to 3 business days. We ask that you follow-up with your pharmacy. Refill HYDROcodone-acetaminophen (NORCO) 7.5-325 MG tablet

## 2017-01-03 NOTE — Telephone Encounter (Signed)
Notified pt rx has been sent to your local pharmacy.../lmb  

## 2017-01-06 ENCOUNTER — Other Ambulatory Visit: Payer: Self-pay | Admitting: *Deleted

## 2017-01-06 ENCOUNTER — Other Ambulatory Visit: Payer: Self-pay

## 2017-01-06 NOTE — Patient Outreach (Signed)
[facility]) Care Management  01/06/2017  [name] [birth year] [id number]   CSW made a second attempt to try and contact patient today to perform the initial phone assessment, as well as assess and assist with social work needs and services; however, patient was unavailable at the time of CSW's call.  A HIPAA compliant message was left for patient on voicemail and CSW is currently awaiting a return call.  CSW will make a third and final outreach attempt within the next week, if a return call is not received from patient in the meantime. [name], BSW, MSW, LCSW  Licensed Education officer, environmental Health System  Mailing [place] [address], [place], [place] [address] Physical Address-[address], [place], [place] [address] Toll Free Main # [phone] Fax # [phone] Cell # [phone]  Office # [phone] [email or web address]

## 2017-01-06 NOTE — Patient Outreach (Signed)
[facility]) Care Management  01/06/2017  Keajah Killough [facility] 1960-08-23 [id number]  Subjective: "I think that has been taking care of"  Objective: none  Referral received 01/03/17.  Assessment: 56 year old with history of falls, sarcoidosis, COPD, osteoarthritis, DVT, pulmonary emboli,vasovagal syncope.  She reports she has personal care service 5 days a week for about an hour each day.  Client reports she saw primary care provider regarding falls and she was taken off Nortriptyline. She denies any falls since being taken off medication which she reports has been about a week.. She also reports she thinks this has helped her issues with dropping things.   Client reports she has a follow up appointment scheduled. She denies any questions or concerns at this time.  Plan: home visit to complete assessment for additional care management needs.  [name], RN, MSN, [place] Coordinator Cell: [phone]

## 2017-01-09 ENCOUNTER — Other Ambulatory Visit: Payer: Self-pay | Admitting: Internal Medicine

## 2017-01-10 ENCOUNTER — Other Ambulatory Visit: Payer: Self-pay | Admitting: Pharmacist

## 2017-01-10 ENCOUNTER — Other Ambulatory Visit: Payer: Self-pay | Admitting: Internal Medicine

## 2017-01-10 NOTE — Patient Outreach (Signed)
[facility]) Care Management  01/10/2017  [place] Apr 09, 1960 [id number]   Called patient to follow up with her. HIPAA identifiers were obtained.  Patient confirmed she received all of her medications from [facility] as requested.Patient's PCP reached out to her after our last conversation on 01/02/17.  Patient reported her back is still hurting. She is taking Hydrocodone/APAP but does not feel like it is working. She was encouraged to reach back out to her PCP.  Plan:  Follow up with the patient in 21 days. Consider closing the pharmacy case  Elayne Guerin, PharmD, [place] Clinical Pharmacist [phone]

## 2017-01-15 ENCOUNTER — Other Ambulatory Visit: Payer: Self-pay | Admitting: *Deleted

## 2017-01-15 ENCOUNTER — Encounter: Payer: Self-pay | Admitting: *Deleted

## 2017-01-15 ENCOUNTER — Other Ambulatory Visit: Payer: Self-pay | Admitting: Nurse Practitioner

## 2017-01-15 NOTE — Patient Outreach (Signed)
Request received from Joanna Saporito, LCSW to mail patient personal care resources.  Information mailed today. 

## 2017-01-15 NOTE — Patient Outreach (Signed)
[facility]) Care Management  01/15/2017  [name] 29-Dec-1960 [id number]   CSW was able to make initial contact with patient today to perform phone assessment, as well as assess and assist with social work needs and services.  CSW introduced self, explained role and types of services provided through [place] Management ([place] Management).  CSW further explained to patient that CSW works with patient's RNCM, also with [place] Management, [name]. CSW then explained the reason for the call, indicating that [name] thought that patient would benefit from social work services and resources to assist with completion of Advanced Directives ([place] documents).  CSW obtained two HIPAA compliant identifiers from patient, which included patient's name and date of birth. Patient admits that she would like to complete her Advanced Directives and would like assistance with completion.  CSW has scheduled an initial home visit with patient for Thursday, January 23, 2017 at 11:30 AM to assist with completion.  In the meantime, CSW will mail an Advanced Directives packet to patient's home for her review.  CSW will also mail patient a List of [name] Assistance to patient's home, per her request. No additional social work needs identified at this very time. [facility] CM Care Plan Problem One     Most Recent Value  Care Plan Problem One  Lack of Advanced Directives.  Role Documenting the Problem One  Clinical Social Worker  Care Plan for Problem One  Active  [facility] CM Short Term Goal #1   Patient will review the Advanced Directives (Living Will and [place] documents) packet mailed to her home by CSW, within the next 7 days.  [facility] CM Short Term Goal #1 Start Date  01/15/17  Interventions for Short Term Goal #1  CSW will mail an Advanced Directives packet to  patient's home for her review.  [facility] CM Short Term Goal #2   Patient will completed Advanced Directives with assistance of CSW during the initial home visit, within the next 10 days.  [facility] CM Short Term Goal #2 Start Date  01/15/17  Interventions for Short Term Goal #2  CSW will meet with patient for an initial home visit to assist with completion of Advanced Directives.    [name], BSW, MSW, LCSW  Licensed Education officer, environmental Health System  Mailing [place] [address], [place], [place] [address] Physical Address-[address], [place], [place] [address] Toll Free Main # [phone] Fax # [phone] Cell # [phone]  Office # [phone] [email or web address]

## 2017-01-21 ENCOUNTER — Ambulatory Visit (INDEPENDENT_AMBULATORY_CARE_PROVIDER_SITE_OTHER): Payer: Medicare Other | Admitting: Internal Medicine

## 2017-01-21 ENCOUNTER — Telehealth: Payer: Self-pay | Admitting: Internal Medicine

## 2017-01-21 ENCOUNTER — Encounter: Payer: Self-pay | Admitting: Internal Medicine

## 2017-01-21 DIAGNOSIS — G4700 Insomnia, unspecified: Secondary | ICD-10-CM

## 2017-01-21 DIAGNOSIS — E538 Deficiency of other specified B group vitamins: Secondary | ICD-10-CM

## 2017-01-21 DIAGNOSIS — K219 Gastro-esophageal reflux disease without esophagitis: Secondary | ICD-10-CM | POA: Diagnosis not present

## 2017-01-21 MED ORDER — CLONAZEPAM 0.5 MG PO TABS: 0.5000 mg | ORAL_TABLET | Freq: Two times a day (BID) | ORAL | 3 refills | Status: DC | PRN

## 2017-01-21 MED ORDER — CLONAZEPAM 0.5 MG PO TABS: 0.5000 mg | ORAL_TABLET | Freq: Every evening | ORAL | 3 refills | Status: DC | PRN

## 2017-01-21 MED ORDER — CYANOCOBALAMIN 1000 MCG/ML IJ SOLN
1000.0000 ug | Freq: Once | INTRAMUSCULAR | Status: AC
  Administered 2017-01-21: 1000 ug via INTRAMUSCULAR

## 2017-01-21 MED ORDER — ESOMEPRAZOLE MAGNESIUM 40 MG PO CPDR: 40.0000 mg | DELAYED_RELEASE_CAPSULE | Freq: Every day | ORAL | 3 refills | Status: DC

## 2017-01-21 MED ORDER — ESOMEPRAZOLE MAGNESIUM 40 MG PO CPDR: 40.0000 mg | DELAYED_RELEASE_CAPSULE | Freq: Every day | ORAL | 1 refills | Status: DC

## 2017-01-21 NOTE — Assessment & Plan Note (Signed)
On B12 

## 2017-01-21 NOTE — Telephone Encounter (Signed)
Routed back to provider 

## 2017-01-21 NOTE — Telephone Encounter (Signed)
Copied from [place] [id number]. Topic: Quick Communication - Rx Refill/Question >> Jan 21, 2017  2:08 PM Robina Ade, Helene Kelp D wrote: Has the patient contacted their pharmacy? Yes (Agent: If no, request that the patient contact the pharmacy for the refill.) Preferred Pharmacy (with phone number or street name): [facility]- Valene B[facility] [place], [place] - [address] Agent: Please be advised that RX refills may take up to 3 business days. We ask that you follow-up with your pharmacy. Dustin from [facility] called and said that they need a clarification on instruction for patient medication clonazePAM (KLONOPIN) 0.5 MG tablet. Her insurance will cover 3 tabs a day. Please call him back, thanks.

## 2017-01-21 NOTE — Progress Notes (Signed)
Subjective:  Patient ID: [name], female    DOB: 1960-06-28  Age: 57 y.o. MRN: [id number]  CC: No chief complaint on file.   HPI [name] presents for LBP, sarcoid, chronic pain C/o falling out of bed in the sleep: "I'm waking up as I'm falling on the floor" - it has happened twice in 3 days...  Outpatient Medications Prior to Visit  Medication Sig Dispense Refill  . Adalimumab (HUMIRA) 40 MG/0.8ML PSKT Inject 40 mg into the skin every 14 (fourteen) days.    . budesonide-formoterol (SYMBICORT) 160-4.5 MCG/ACT inhaler Inhale 2 puffs into the lungs 2 (two) times daily. 1 Inhaler 0  . carvedilol (COREG) 12.5 MG tablet TAKE 1 TABLET BY MOUTH TWICE DAILY WITH A MEAL 180 tablet 1  . chlorhexidine (PERIDEX) 0.12 % solution Use as directed 5 mLs in the mouth or throat 2 (two) times daily. Pt is to swish and spit. 120 mL 0  . furosemide (LASIX) 40 MG tablet TAKE 1 TABLET BY MOUTH EVERY MORNING FOR EDEMA 90 tablet 1  . gabapentin (NEURONTIN) 300 MG capsule Take 1 capsule (300 mg total) by mouth 2 (two) times daily as needed (for pain). 180 capsule 1  . HYDROcodone-acetaminophen (NORCO) 7.5-325 MG tablet Take 1 tablet by mouth every 4 (four) hours as needed for moderate pain or severe pain. 30 tablet 0  . lidocaine (XYLOCAINE) 2 % solution Use as directed 20 mLs in the mouth or throat 3 (three) times daily as needed for mouth pain. Pt is to swish and spit.    . metFORMIN (GLUCOPHAGE-XR) 500 MG 24 hr tablet TAKE 2 TABLETS(1000 MG) BY MOUTH DAILY WITH BREAKFAST (Patient taking differently: Take 1,000 mg by mouth daily with breakfast. TAKE 2 TABLETS(1000 MG) BY MOUTH DAILY WITH BREAKFAST) 180 tablet 0  . potassium chloride (KLOR-CON 10) 10 MEQ tablet Take 1 tablet (10 mEq total) by mouth daily. 90 tablet 3  . sitaGLIPtin (JANUVIA) 50 MG tablet Take 1 tablet (50 mg total) by mouth daily. 90 tablet 3  . Suvorexant (BELSOMRA) 20 MG TABS Take 1 tablet by mouth at bedtime. 90  tablet 0  . topiramate (TOPAMAX) 50 MG tablet Take 1 tablet (50 mg total) by mouth 2 (two) times daily. 60 tablet 0  . venlafaxine XR (EFFEXOR-XR) 75 MG 24 hr capsule Take 1 capsule (75 mg total) by mouth daily with breakfast. 30 capsule 0  . VENTOLIN HFA 108 (90 Base) MCG/ACT inhaler Inhale 2 puffs into the lungs every 6 (six) hours as needed for wheezing or shortness of breath. 200/8=25 18 g 11  . VOLTAREN 1 % GEL Apply 2 g topically 2 (two) times daily as needed (for pain). 100/4=25 100 g 2  . XARELTO 20 MG TABS tablet Take 1 tablet (20 mg total) by mouth daily. with food 30 tablet 0  . esomeprazole (NEXIUM) 40 MG capsule Take 1 capsule (40 mg total) by mouth daily before breakfast. 90 capsule 1  . VOLTAREN 1 % GEL Apply 2 g topically 2 (two) times daily as needed (for pain). 100 g 0   No facility-administered medications prior to visit.     ROS Review of Systems  Constitutional: Negative for activity change, appetite change, chills, fatigue and unexpected weight change.  HENT: Negative for congestion, mouth sores and sinus pressure.   Eyes: Negative for visual disturbance.  Respiratory: Negative for cough and chest tightness.   Gastrointestinal: Negative for abdominal pain and nausea.  Genitourinary: Negative for difficulty  urinating, frequency and vaginal pain.  Musculoskeletal: Positive for back pain and gait problem.  Skin: Negative for pallor and rash.  Neurological: Positive for weakness. Negative for dizziness, tremors, numbness and headaches.  Psychiatric/Behavioral: Positive for decreased concentration and sleep disturbance. Negative for confusion and suicidal ideas. The patient is nervous/anxious.     Objective:  BP 130/82 (BP Location: Left Arm, Patient Position: Sitting, Cuff Size: Large)   Pulse 79   Temp 98.4 F (36.9 C) (Oral)   Ht 5\' 7"  (1.702 m)   Wt (!) 309 lb (140.2 kg)   SpO2 94% Comment: 2 liters of o2  BMI 48.40 kg/m   BP Readings from Last 3 Encounters:    01/21/17 130/82  12/30/16 128/64  12/17/16 116/78    Wt Readings from Last 3 Encounters:  01/21/17 (!) 309 lb (140.2 kg)  12/30/16 (!) 315 lb 6.4 oz (143.1 kg)  12/17/16 (!) 306 lb (138.8 kg)    Physical Exam  Constitutional: She appears well-developed. No distress.  HENT:  Head: Normocephalic.  Right Ear: External ear normal.  Left Ear: External ear normal.  Nose: Nose normal.  Mouth/Throat: Oropharynx is clear and moist.  Eyes: Conjunctivae are normal. Pupils are equal, round, and reactive to light. Right eye exhibits no discharge. Left eye exhibits no discharge.  Neck: Normal range of motion. Neck supple. No JVD present. No tracheal deviation present. No thyromegaly present.  Cardiovascular: Normal rate, regular rhythm and normal heart sounds.  Pulmonary/Chest: No stridor. No respiratory distress. She has no wheezes.  Abdominal: Soft. Bowel sounds are normal. She exhibits no distension and no mass. There is no tenderness. There is no rebound and no guarding.  Musculoskeletal: She exhibits tenderness. She exhibits no edema.  Lymphadenopathy:    She has no cervical adenopathy.  Neurological: She displays normal reflexes. No cranial nerve deficit. She exhibits normal muscle tone. Coordination abnormal.  Skin: No rash noted. No erythema.  Psychiatric: Her behavior is normal. Judgment and thought content normal.   A walker Bruised R arm  Lab Results  Component Value Date   WBC 8.7 12/08/2016   HGB 11.2 (L) 12/08/2016   HCT 38.1 12/08/2016   PLT 223 12/08/2016   GLUCOSE 142 (H) 12/08/2016   CHOL 193 06/29/2008   TRIG 145.0 06/29/2008   HDL 67.50 06/29/2008   LDLCALC 97 06/29/2008   ALT 15 08/23/2016   AST 16 08/23/2016   NA 133 (L) 12/08/2016   K 3.3 (L) 12/08/2016   CL 101 12/08/2016   CREATININE 1.14 (H) 12/08/2016   BUN 13 12/08/2016   CO2 21 (L) 12/08/2016   TSH 2.31 08/23/2016   INR 1.07 07/22/2016   HGBA1C 7.1 (H) 07/22/2016   MICROALBUR <0.7 02/01/2015     Dg Lumbar Spine 2-3 Views  Result Date: 12/17/2016 CLINICAL DATA:  [place] this past Friday and has right lower back and pelvic pain. EXAM: LUMBAR SPINE - 2-3 VIEW COMPARISON:  Lumbar spine series dated May 18, 2013 FINDINGS: The lumbar vertebral bodies are preserved in height. The pedicles and transverse processes are intact. The disc space heights are well maintained. There is no spondylolisthesis. There is facet joint hypertrophy at L4-5 and at L5-S1. There is an anterior bridging osteophyte at L1-2 which is stable. The observed portions of the sacrum are normal. IMPRESSION: There is no acute bony abnormality of the lumbar spine. There is mild degenerative facet joint hypertrophy at L4-5 and at L5-S1. Electronically Signed   By: David  Martinique M.D.  On: 12/17/2016 13:38   Dg Pelvis 1-2 Views  Result Date: 12/17/2016 CLINICAL DATA:  [place] 5 days ago with persistent right-sided lower back and pelvic pain. EXAM: PELVIS - 1-2 VIEW COMPARISON:  Limited views of the pelvis from a lumbar spine series of March 07, 2009 FINDINGS: The bones are subjectively adequately mineralized. No acute or healing fracture is observed. The observed portions of the sacrum are normal. There are degenerative changes of the SI joints. The hip joint spaces are well maintained. No acute hip fracture is observed. IMPRESSION: There is no acute bony abnormality of the pelvis. Electronically Signed   By: David  Martinique M.D.   On: 12/17/2016 13:36    Assessment & Plan:   There are no diagnoses linked to this encounter. I am having [name] maintain her lidocaine, Adalimumab, sitaGLIPtin, potassium chloride, Suvorexant, metFORMIN, budesonide-formoterol, VENTOLIN HFA, XARELTO, chlorhexidine, topiramate, venlafaxine XR, HYDROcodone-acetaminophen, carvedilol, furosemide, gabapentin, VOLTAREN, and esomeprazole.  Meds ordered this encounter  Medications  . esomeprazole (NEXIUM) 40 MG capsule    Sig: Take 1 capsule (40  mg total) by mouth daily before breakfast.    Dispense:  90 capsule    Refill:  1     Follow-up: No Follow-up on file.  Walker Kehr, MD

## 2017-01-21 NOTE — Assessment & Plan Note (Signed)
Nexium 

## 2017-01-21 NOTE — Assessment & Plan Note (Signed)
  D/c Belsomra falling out of bed in the sleep: "I'm waking up as I'm falling on the floor"   Clonazepam at hs to start

## 2017-01-21 NOTE — Telephone Encounter (Signed)
Corrected Thx 

## 2017-01-22 ENCOUNTER — Other Ambulatory Visit: Payer: Self-pay
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
## 2017-01-23 ENCOUNTER — Other Ambulatory Visit: Payer: Self-pay | Admitting: *Deleted

## 2017-01-23 ENCOUNTER — Ambulatory Visit: Payer: Self-pay | Admitting: *Deleted

## 2017-01-23 NOTE — Patient Outreach (Signed)
[place] [facility]) Care Management  01/23/2017  [place] 1961/01/14 [id number]   CSW was able to make contact with patient this morning to confirm initial home visit scheduled for today.  Patient reported not feeling well today, requesting to reschedule the visit.  Initial home visit has been rescheduled for Tuesday, January 28, 2017 at 10:00 AM, per patient's request. [name], BSW, MSW, [place]  Licensed Clinical Social Worker  [place]  Mailing [place]. [address], [place], [place] [address] Physical Address-[address], [place], [place] [address] Toll Free Main # [phone] Fax # [phone] Cell # [phone]  Office # [phone] [email or web address]

## 2017-01-27 ENCOUNTER — Other Ambulatory Visit: Payer: Self-pay | Admitting: *Deleted

## 2017-01-27 NOTE — Patient Outreach (Signed)
[place]) Care Management  01/27/2017  [place] 02/08/1960 [id number]   Call received from patient today requesting to reschedule the initial home visit, as patient has been asked to babysit her grandchildren in [place], [place] this week and will not be available Monday through Thursday.  Per patient's request, the initial home visit has been rescheduled to Friday, January 31, 2017 at 10:00 AM. [name], BSW, MSW, [place]  Licensed Clinical Social Worker  [place]  Mailing [place]. [address], [place], [place] [address] Physical Address-[address], [place], [place] [address] Toll Free Main # [phone] Fax # [phone] Cell # [phone]  Office # [phone] [email or web address]

## 2017-01-28 ENCOUNTER — Telehealth: Payer: Self-pay | Admitting: Internal Medicine

## 2017-01-28 ENCOUNTER — Other Ambulatory Visit: Payer: Self-pay | Admitting: Internal Medicine

## 2017-01-28 ENCOUNTER — Other Ambulatory Visit: Payer: Self-pay | Admitting: *Deleted

## 2017-01-28 ENCOUNTER — Ambulatory Visit: Payer: Self-pay | Admitting: *Deleted

## 2017-01-28 ENCOUNTER — Other Ambulatory Visit: Payer: Self-pay | Admitting: Adult Health

## 2017-01-28 ENCOUNTER — Ambulatory Visit: Payer: Self-pay | Admitting: Pharmacist

## 2017-01-28 MED ORDER — XARELTO 20 MG PO TABS: 20.0000 mg | ORAL_TABLET | Freq: Every day | ORAL | 0 refills | Status: DC

## 2017-01-28 NOTE — Progress Notes (Signed)
LOV 01/21/17  NOV 03/21/17  xarelto refilled.

## 2017-01-28 NOTE — Telephone Encounter (Signed)
Copied from [place] [id number]. Topic: Quick Communication - Rx Refill/Question >> Jan 28, 2017  3:28 PM [place], Laban Emperor wrote: Medication: XARELTO 20 MG TABS tablet [[id number]]    Has the patient contacted their pharmacy? Yes.     (Agent: If no, request that the patient contact the pharmacy for the refill.)   Preferred Pharmacy (with phone number or street name): [facility]- [place], [place] - [address] [phone] (Phone) [phone] (Fax)  Pt states she only has 1 pill left.   Agent: Please be advised that RX refills may take up to 3 business days. We ask that you follow-up with your pharmacy.

## 2017-01-30 ENCOUNTER — Other Ambulatory Visit: Payer: Self-pay | Admitting: Pharmacist

## 2017-01-30 NOTE — Patient Outreach (Signed)
[facility]) Care Management  01/30/2017  [place] [name], 1960 [id number]  Patient was called to follow up.  HIPAA identifiers were obtained. Patient stated she was started on clonazepam 0.5mg  1-2 tablets at bedtime and is taking hydrocodone/APAP for pain.    Home visit was scheduled to make sure the patient's pill packs are correct for 02/03/17.  Plan: Conduct medication review and inspect the patient's pill packs and the home visit.  Elayne Guerin, PharmD, [place] Clinical Pharmacist [phone]

## 2017-01-31 ENCOUNTER — Other Ambulatory Visit: Payer: Self-pay | Admitting: *Deleted

## 2017-01-31 NOTE — Patient Outreach (Signed)
[facility]) Care Management  01/31/2017  [name] Mar 19, 1960 [id number]   CSW was able to meet with patient and patient's friend, [name] today to perform an initial home visit.  During the home visit, CSW was able to assist patient with completion of her Advanced Directives (Living Will and [name] documents), providing patient with a list of [place] affiliated facilities that will be able to assist with notarization of the documents.  CSW also provided patient with a List of [name] Assistance to help pay her Surveyor, quantity. CSW will perform a case closure on patient, as all goals of treatment have been met from social work standpoint and no additional social work needs have been identified at this time. CSW will notify patient's RNCM with [place] Management, [name] of CSW's plans to close patient's case.  CSW will fax an update to patient's Primary Care Physician, Dr. [name] Plotnikov to ensure that they are aware of CSW's involvement with patient's plan of care.  CSW will submit a case closure request to Alycia Rossetti, Care Management Assistant with [place] Management, in the form of an [employer].  CSW will ensure that [name] is aware of [name]'s, RNCM with [place] Management, continued involvement with patient's care. [facility] CM Care Plan Problem One     Most Recent Value  Care Plan Problem One  Lack of Advanced Directives.  Role Documenting the Problem One  Clinical Social Worker  Care Plan for Problem One  Active  [facility] CM Short Term Goal #1   Patient will review the Advanced Directives (Living Will and [place] documents) packet mailed to her home by CSW, within the next 7 days.  [facility] CM Short Term Goal #1 Start Date  01/15/17  [facility] CM Short Term Goal #1 Met Date  01/31/17  Interventions  for Short Term Goal #1  CSW will mail an Advanced Directives packet to patient's home for her review.  [facility] CM Short Term Goal #2   Patient have will completed Advanced Directives with assistance of CSW during the initial home visit, within the next 10 days.  [facility] CM Short Term Goal #2 Start Date  01/15/17  [facility] CM Short Term Goal #2 Met Date  01/31/17  Interventions for Short Term Goal #2  CSW will meet with patient for an initial home visit to assist with completion of Advanced Directives.    [name], BSW, MSW, LCSW  Licensed Education officer, [facility]  Mailing [place] [address], [place], [place] [address] Physical Address-[address], [place], [place] [address] Toll Free Main # [phone] Fax # [phone] Cell # [phone]  Office # [phone] [email or web address]

## 2017-02-03 ENCOUNTER — Other Ambulatory Visit: Payer: Self-pay | Admitting: Pharmacist

## 2017-02-03 ENCOUNTER — Ambulatory Visit: Payer: Self-pay | Admitting: Pharmacist

## 2017-02-03 NOTE — Patient Outreach (Signed)
[place] [facility]) Care Management  02/03/2017  [place] [birth year] [id number]   Patient was called just before her scheduled home visit time to be sure we were still on. HIPAA identifiers were obtained. Patient said she would like to reschedule our appointment because she did not rest well. Patient reported nasal congestion and that she experienced a nose bleed.  She said she would call her provider and let them know.  She uses oxygen at night. Patient said she was able to stop the nose bleed but was too tired for a pharmacy visit today.  Plan: Call patient back in 1-3 business days to check on her and reschedule.  Elayne Guerin, PharmD, [place] Clinical Pharmacist [phone]

## 2017-02-04 ENCOUNTER — Other Ambulatory Visit: Payer: Self-pay | Admitting: Pharmacist
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
## 2017-02-06 ENCOUNTER — Telehealth: Payer: Self-pay

## 2017-02-06 NOTE — Telephone Encounter (Signed)
Key: [id number]

## 2017-02-08 NOTE — Telephone Encounter (Signed)
PA approved.

## 2017-02-10 ENCOUNTER — Telehealth: Payer: Self-pay | Admitting: Endocrinology

## 2017-02-10 ENCOUNTER — Ambulatory Visit: Payer: Medicare Other | Admitting: Endocrinology

## 2017-02-14 ENCOUNTER — Other Ambulatory Visit: Payer: Self-pay | Admitting: Pharmacist
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
## 2017-02-17 ENCOUNTER — Encounter (HOSPITAL_COMMUNITY)
Admission: RE | Admit: 2017-02-17 | Discharge: 2017-02-17 | Disposition: A | Discharge status: Home / Self Care | Payer: Medicare Other | Source: Ambulatory Visit | Attending: Pulmonary Disease | Admitting: Pulmonary Disease

## 2017-02-17 ENCOUNTER — Encounter (HOSPITAL_COMMUNITY): Payer: Self-pay

## 2017-02-17 VITALS — BP 128/58 | HR 73 | Resp 18 | Ht 67.0 in | Wt 297.6 lb

## 2017-02-17 DIAGNOSIS — J449 Chronic obstructive pulmonary disease, unspecified: Secondary | ICD-10-CM | POA: Insufficient documentation

## 2017-02-17 DIAGNOSIS — D869 Sarcoidosis, unspecified: Secondary | ICD-10-CM | POA: Diagnosis not present

## 2017-02-17 NOTE — Progress Notes (Signed)
[name] 57 y.o. female Pulmonary Rehab Orientation Note Patient arrived today in Cardiac and Pulmonary Rehab for orientation to Pulmonary Rehab. She was transported from [employer] via wheel chair. She does carry portable oxygen. Per pt, she uses oxygen continuously. Color good, skin warm and dry. Patient is oriented to time and place. Patient's medical history, psychosocial health, and medications reviewed. Psychosocial assessment reveals pt lives alone. Pt is currently unemployed, disabled. Pt hobbies include watching NBA basketball games. Pt reports her stress level is low. Areas of stress/anxiety include Health Family Finances. Her sarcoid causes skin lesions that are painful. Her family is not as supportive as she wishes and she states she is saving money for glasses?? Pt exhibits signs of depression. Signs of depression include sadness and hypersomnia. PHQ2/9 score 2/7. Pt shows good  coping skills with positive outlook. She is offered emotional support and reassurance. Will continue to monitor and evaluate progress toward psychosocial goal(s) of remaining positive about her ability to participate and complete pulmonary rehab. Physical assessment reveals heart rate is normal, breath sounds clear to auscultation, no wheezes, rales, or rhonchi. Grip strength equal, strong. Distal pulses palpable. Moderate amount of non-pitting edema noted to lower legs and ankles.Patient reports she does take medications as prescribed. Patient states she follows a Regular diet. The patient reports no specific efforts to gain or lose weight. Patient's weight will be monitored closely. Demonstration and practice of PLB using pulse oximeter. Patient able to return demonstration satisfactorily. Safety and hand hygiene in the exercise area reviewed with patient. Patient voices understanding of the information reviewed. Department expectations discussed with patient and achievable goals were set. The patient shows  enthusiasm about attending the program and we look forward to working with this nice lady. The patient is scheduled for a 6 min walk test on 02/25/17 and to begin exercise on 03/04/17.   45 minutes was spent on a variety of activities such as assessment of the patient, obtaining baseline data including height, weight, BMI, and grip strength, verifying medical history, allergies, and current medications, and teaching patient strategies for performing tasks with less respiratory effort with emphasis on pursed lip breathing.

## 2017-02-20 ENCOUNTER — Other Ambulatory Visit: Payer: Self-pay

## 2017-02-20 NOTE — Patient Outreach (Addendum)
[facility] [facility]) Care Management  02/20/2017  [name] [facility] May 14, 1960 [id number]   RNCM called to follow up. Client reports she is doing well. She states her oxygen tanks were delivered that same day. She states she is starting pulmonary rehabilitation. She reports her cost is $500 and states ask if [facility] could help her with the cost. RNCM reinforced that would not be possible. RNCM encouraged client to ask her family/friends and/or church if possible. Client states pulmonary rehabilitation informed her that they would set her up with a payment plan. RNCM encouraged client to set up a payment plan with the pulmonary rehabilitation department. Client states she plans to proceed with participating in the pulmonary rehabilitation program.  Client reports she will need pulse oximeter also. Due to client's financial constraints RNCM will provide. No additional nursing care management needs at this time. Nursing will no longer be actively involved in case. Client is in agreement. Client encouraged to call RNCM as needed. She has RNCM's contact number.  Plan: Update [facility] pharmacist, RNCM will send pulse oximeter by mail.  [name], RN, MSN, [place] Coordinator Cell: [phone]

## 2017-02-24 ENCOUNTER — Other Ambulatory Visit: Payer: Self-pay | Admitting: Internal Medicine

## 2017-02-24 ENCOUNTER — Ambulatory Visit: Admit: 2017-02-24 | Discharge: 2017-02-25 | Payer: MEDICARE

## 2017-02-24 DIAGNOSIS — D863 Sarcoidosis of skin: Secondary | ICD-10-CM | POA: Diagnosis not present

## 2017-02-24 DIAGNOSIS — L438 Other lichen planus: Secondary | ICD-10-CM | POA: Diagnosis not present

## 2017-02-24 DIAGNOSIS — Z79899 Other long term (current) drug therapy: Secondary | ICD-10-CM | POA: Diagnosis not present

## 2017-02-24 DIAGNOSIS — D869 Sarcoidosis, unspecified: Secondary | ICD-10-CM

## 2017-02-24 DIAGNOSIS — L439 Lichen planus, unspecified: Secondary | ICD-10-CM

## 2017-02-24 DIAGNOSIS — D485 Neoplasm of uncertain behavior of skin: Principal | ICD-10-CM

## 2017-02-24 MED ORDER — CLOBETASOL 0.05 % TOPICAL OINTMENT
Freq: Two times a day (BID) | TOPICAL | 5 refills | 0 days | Status: CP
Start: 2017-02-24 — End: 2017-11-17

## 2017-02-24 NOTE — Unmapped (Signed)
ASSESSMENT/PLAN:  Sarcoidosis, cutaneous and pulmonary   1. Dr. Vassie Loll (pulmonary) on board with plan, continue adalimumab 40mg  weekly (started eow 06/2016, increased to weekly 09/2016)  2. 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  3. Consider future ophtho exam to screen for ocular sarcoid - she was referred but has not been able to make it due to other medical issues.    Tender nodule on the L upper arm: After verbal informed consent was obtained, this site was cleansed with alcohol, anesthetized with 2% lidocaine with epinephrine and excision was performed to the subcutaneous fat with a punch tool.  Hemostasis was achieved with 4.0 nylon sutures and petrolatum and a bandage were applied.  Post care instructions were given.  We will call the patients with results when available. Lesion size: 5mm  Margin: 1.63mm  Total size with margin: 8mm     Lichen planus: After the patient was informed of risks, benefits and side effects of intralesional steroid injection, the patient elected to undergo injection. Informed verbal consent was obtained. Risk of atrophy  and dyspigmentation with injection was explained. Kenalog 10 mg/ml was injected locally into the sites located bilateral thighs in a clean fashion following alcohol prep.   Total volume in ml=0.7.  Number of sites treated: >7   Wound care was explained to the patient   -clobetasol 0.05% ointment applied to affected areas twice daily as needed.  Appropriate use and side effects discussed.     Telogen effluvium, likely related to recent DVT and hospitalization - reassurance on natural course provided.    RTC: 12 weeks    SUBJECTIVE:  CC:  Carolyn Newton is a 57 y.o. female  who presents for follow-up of sarcoidosis  She started adalimumab (80mg  loading, 40mg  eow maintenance) in 06/2016, increased to 40mg  weekly 09/2016  Thinks this is helping more than eow dosing and she doesn't have cyclic flares before each dose like she did before.  ILK usually helps improve symptoms, but most areas are doing well today.     Also has lichen planus history and notes intensely itchy areas on bilateral thighs today that she is scratching severely. Not using anything topically.    She developed a DVT over the summer and is getting an echocardiogram in the next couple of weeks since her BNP was slightly elevated.  She says there is also concern that it she could have some neurologic involvement, which is being worked up.   Also noted diffuse hair shedding in the last few weeks.    Also has tender nodule on the L upper arm for a couple of months, very painful.  Does not think it's changing.    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  Many years on and off of prednisone with temporary improvement, but overall thinks she is worse over time.  Has had a large amount of weight gain in the last year since restarting prednisone in April 2016.  Had come off recently, but now on Medrol dose pack from her PCP.  Was on methotrexate given worsening cutaneous involvement of the scalp.  Dose was 10mg  taken 2 doses 12 hours apart in winter 2017-18.  Did that for 3 months, but stopped because she thought it wasn't helping.  Had been on hydroxychloroquine many years ago, but it was not very helpful.  Has been on doxycycline many times over the years without much improvement.     Followed by  Dr. Vassie Loll for pulmonary sarcoidosis and we have discussed the treatment plan.    ROS: The balance of 10 systems is negative unless otherwise documented..      OBJECTIVE:     Gen: Well appearing, well nourished female in no acute distress. Alert and oriented x 3  Exam: Exam of the scalp, face, eyelids, lips, neck, chest, abdomen, back, bilateral upper and lower extremities, palms, soles, and nails was performed and notable for:  1. Alopecic scaly, erythematuos patches on the frontal and occipital scalp, similar plaques on the L upper arm, L thigh, concha bilaterally, and nasolabial folds.  Slight-moderate interval improvement.  2. Moderate diffuse thinning of hair on most of the scalp  3. Violaceous patches with gray-white accentuation of skin tension lines on bilateral inner thighs and left lateral thigh.  -sites not commented on demonstrate normal findings.

## 2017-02-24 NOTE — Unmapped (Addendum)
Punch biopsy  Punch biopsy involves numbing a small area of your skin, then obtaining a sample to help [place] make a proper diagnosis of your skin condition. The biopsy site is typically closed with one to 3 small stitches to help the site heal. Biopsy results will usually return in 7-14 days.    To care for the area: Leave the bandage in place until the morning after your procedure is performed. On a daily basis, carefully remove the bandage, then shower or wash as usual. Allow water to run over the site. Please do not scrub. Carefully dry the area, then apply ointment (some people develop an allergy to Neosporin, so we recommend Vaseline or Aquaphor). Cover the site with a fresh bandage. Should any bleeding occur, apply firm pressure for 15 minutes. The treated site will heal best if  a scab never forms (the wound heals by new skin cells traveling from the outside toward the middle-their journey is easier if no scab stands in their way).    Long-term care: the site will be more sensitive than your surrounding skin. Keep it covered, and remember to apply sunscreen every day to all your exposed skin. A scar may remain which is lighter or pinker than your normal skin. Your body will continue to improve your scar for up to one year.    Infection following this procedure is rare. However, if you are worried about the appearance of your site, contact your doctor. Complete healing of the site may take up to one month. We have a physician on call at all times. If you have any concerns about the site, please call our clinic at [phone].    Your stitches should be removed in 1 to 2 weeks.       Sarcoidosis: Care Instructions  Your Care Instructions    Sarcoidosis (say sar-koy-DOH-sus) is a rare disease that causes tiny lumps of cells throughout the body called granulomas. These lumps are too small to see or feel. They can form anywhere on the inside or outside of the body and can cause permanent scar tissue. They often form in the lungs, lymph nodes, liver, skin, or eyes. Sarcoidosis may affect how an organ works. For instance, if it is in the lungs, you may be short of breath.  For most people, sarcoidosis is a long-term disease that lasts several years or a lifetime. But some cases go away in a few months. Experts have no way of knowing how it will affect you. For some people, the disease may cause no symptoms at all. For others, symptoms may include fever, body aches, swollen lymph glands, shortness of breath, painful joints, and numbness. It may lead to lung or heart problems. Sometimes sarcoidosis can cause high calcium levels in the blood.  Sarcoidosis occurs most often in young and middle-aged adults. Although the cause is not known, the disease does not spread from person to person.  Different types of sarcoidosis have different treatments. Sarcoidosis may require long-term treatment (lasting months to years) with corticosteroids and other medicines, especially if it causes symptoms. You may also need regular tests.  Follow-up care is a key part of your treatment and safety. Be sure to make and go to all appointments, and call your doctor if you are having problems. It's also a good idea to know your test results and keep a list of the medicines you take.  How can you care for yourself at home?  ?? Take your medicines exactly as prescribed. Call  your doctor if you think you are having a problem with your medicine.  ?? Do not smoke. Smoking can make sarcoidosis worse. If you need help quitting, talk to your doctor about stop-smoking programs and medicines. These can increase your chances of quitting for good.  ?? Avoid dust, smoke, and fumes. They can harm your lungs.  ?? Drink plenty of fluids, enough so that your urine is light yellow or clear like water. If you have kidney, heart, or liver disease and have to limit fluids, talk with your doctor before you increase the amount of fluids you drink.  ?? If your doctor recommends it, get more exercise. Walking is a good choice. Bit by bit, increase the amount you walk every day. Try for at least 30 minutes on most days of the week. You also may want to swim, bike, or do other activities.  When should you call for help?  Call 911 anytime you think you may need emergency care. For example, call if:  ?? ?? You have severe trouble breathing.   ?? ?? You passed out (lost consciousness).   ??Call your doctor now or seek immediate medical care if:  ?? ?? You have changes in your vision.   ?? ?? You are very tired, get confused, or urinate a lot.   ?? ?? Your symptoms do not get better, or they get worse.   ??Watch closely for changes in your health, and be sure to contact your doctor if you have any problems.  Where can you learn more?  Go to [facility] at [email or web address]  Select Preferences in the upper right hand corner, then select Health Library under Resources. Enter H756 in the search box to learn more about Sarcoidosis: Care Instructions.  Current as of: September 18, 2016  Content Version: 11.9  ?? 2006-2018 [employer]. Care instructions adapted under license by [facility]. If you have questions about a medical condition or this instruction, always ask your healthcare professional. [employer], Inc[employer] disclaims any warranty or liability for your use of this information.

## 2017-02-25 ENCOUNTER — Ambulatory Visit (HOSPITAL_COMMUNITY): Payer: Medicare Other

## 2017-02-25 ENCOUNTER — Telehealth (HOSPITAL_COMMUNITY): Payer: Self-pay | Admitting: Internal Medicine

## 2017-02-25 NOTE — Telephone Encounter (Signed)
Routing to dr plotnikov, please advise, thanks 

## 2017-02-27 ENCOUNTER — Other Ambulatory Visit: Payer: Self-pay

## 2017-02-27 ENCOUNTER — Telehealth: Payer: Self-pay | Admitting: Endocrinology

## 2017-02-27 MED ORDER — METFORMIN HCL ER 500 MG PO TB24: ORAL_TABLET | ORAL | 0 refills | Status: DC

## 2017-02-27 NOTE — Unmapped (Signed)
Contacted patient with result via MyChart on 02/26/17

## 2017-02-27 NOTE — Telephone Encounter (Signed)
I have sent with note to pharmacy that patient needs to make a f/u appt.

## 2017-02-27 NOTE — Telephone Encounter (Signed)
°  Pt needs prescription sent in   metFORMIN (GLUCOPHAGE-XR) 500 MG 24 hr tablet    [place], [place] - [address]

## 2017-02-28 ENCOUNTER — Other Ambulatory Visit: Payer: Self-pay | Admitting: Pharmacist

## 2017-02-28 MED ORDER — BETAMETHASONE DIPROPIONATE 0.05 % TOPICAL OINTMENT
Freq: Two times a day (BID) | TOPICAL | 11 refills | 0.00000 days | Status: CP
Start: 2017-02-28 — End: 2017-03-03

## 2017-02-28 NOTE — Unmapped (Signed)
Call from pharmacy.  States patient's clobetasol Rx will not be covered by insurance due to non-formulary.  Betamethasone or fluocinonide are alternatives.

## 2017-02-28 NOTE — Patient Outreach (Signed)
[facility] [facility] Care Management  02/28/2017  [place] 1960/02/06 [id number]   Patient was called regarding medication management to follow up. HIPAA identifiers were obtained. Patient is a 57 year old female with multiple medical conditions including but not limited to:  sarcoidosis, on Humira, morbid obesity, type 2 diabetes mellitus, depression, B 12 deficiency,vitamin d deficiency, depression, insomnia,OSA, GERD, and history of pulmonary embolus.  Patient recently saw Dr. Juluis Pitch with Dermatology at [facility] and had a punch biopsy completed. In addition, she was re-started on Clobetasol ointment but was told it was not covered by her insurance.  The patient has [employer] Stand alone Part D plan. Review of the [place] revealed clobetasol is non-formulary.  Dr. Steele Sizer office was called and a request was made to change the patient's therapy to another high potency topical corticosteroid.    Plan: Follow up with the provider and patient in 1 business day.  Elayne Guerin, PharmD, [place] Clinical Pharmacist [phone]

## 2017-03-03 ENCOUNTER — Other Ambulatory Visit: Payer: Self-pay | Admitting: Internal Medicine

## 2017-03-03 ENCOUNTER — Other Ambulatory Visit: Payer: Self-pay | Admitting: Pharmacist

## 2017-03-03 ENCOUNTER — Telehealth: Payer: Self-pay | Admitting: Internal Medicine

## 2017-03-03 MED ORDER — XARELTO 20 MG PO TABS: 20.0000 mg | ORAL_TABLET | Freq: Every day | ORAL | 0 refills | Status: DC

## 2017-03-03 MED ORDER — VENLAFAXINE HCL ER 75 MG PO CP24: 75.0000 mg | ORAL_CAPSULE | Freq: Every day | ORAL | 1 refills | Status: DC

## 2017-03-03 NOTE — Unmapped (Signed)
Returned call to patient.  Needs the betamethasone sent to [facility] instead of [facility].

## 2017-03-03 NOTE — Patient Outreach (Signed)
[facility]) Care Management  03/03/2017  [name] [facility] February 14, 1960 [id number]   Patient was called back to follow up on the clobetasol ointment prescription that was non-formulary last week. HIPAA identifiers were obtained. Patient said she had not heard anything about the clobetasol.  Review of the patient's chart showed Dr. Louanne Skye office documented my call.    [facility] was called on the patient's behalf. The Technician said they did not have another topical product to replace the clobetasol.  Patient requested refills on:  Potassium, Gabapentin, Xarelto and Venlafaxine.  (These were requested from [facility] for the patient during the call).  Patient needed refills on Xarelto and Venlafaxine.   Dr. Judeen Hammans office was called to request refills be sen to [facility] for Xarelto and Venlafaxine for 90 day supplies.  Patient said she would call Dr. Otho Ket 's office back about getting substitution for the clobetasol.  Plan:  Call patient back within 2 business days.  Elayne Guerin, PharmD, [place] Clinical Pharmacist [phone]

## 2017-03-03 NOTE — Telephone Encounter (Signed)
Copied from [place]. Topic: Quick Communication - See Telephone Encounter >> Mar 03, 2017 12:00 PM Bea Graff, NT wrote: CRM for notification. See Telephone encounter for: [name] with [facility] are needing the  venlafaxine XR (EFFEXOR-XR)  and Xarelto to go back to 90 day refills. CB#: [phone]  03/03/17.

## 2017-03-03 NOTE — Telephone Encounter (Signed)
Routing to dr plotnikov, please advise, thanks 

## 2017-03-03 NOTE — Telephone Encounter (Signed)
  Xarelto, Effexor refill Last OV: 01/31/17 Last Refill:Xarelto 01/28/17 30 tab/0 refill; Effexor 01/01/17 30 tab/0 refills Pharmacy: [facility] [place], [place] - [address] [phone] (Phone) [phone] (Fax)   Katrina with [facility] requests 90 day supply of meds, [phone]

## 2017-03-03 NOTE — Telephone Encounter (Signed)
Reviewed chart pt is up-to-date sent refills to [facility].../lmb  

## 2017-03-04 ENCOUNTER — Encounter (HOSPITAL_COMMUNITY): Payer: Medicare Other

## 2017-03-04 MED ORDER — BETAMETHASONE DIPROPIONATE 0.05 % TOPICAL OINTMENT
Freq: Two times a day (BID) | TOPICAL | 11 refills | 0.00000 days | Status: CP
Start: 2017-03-04 — End: 2018-03-04

## 2017-03-05 ENCOUNTER — Other Ambulatory Visit: Payer: Self-pay | Admitting: Pharmacist

## 2017-03-05 NOTE — Patient Outreach (Signed)
[place] [facility]) Care Management  03/05/2017  Sama Arauz [facility] March 05, 1960 [id number]   Called patient to follow up on clobetasol ointment HIPAA identifiers were obtained. Patient confirmed that she spoke with the nurse at Dr. Steele Sizer office who said they sent a prescription for betamethasone ointment to [facility]. The patient said she did not have a ride to get to [facility] to pick up her prescription. [facility] had already helped the patient arrange delivery service through [facility].  Patient said she called [facility] and they told her she would need to call [place]. The patient said she was confused.   [place] was called and the Pharmacist said he would call [facility] and transfer the prescription and deliver to her.   Plan: Follow up with the patient in 3-5 days to be sure the delivery was received.  Elayne Guerin, PharmD, [place] Clinical Pharmacist [phone]

## 2017-03-06 ENCOUNTER — Encounter (HOSPITAL_COMMUNITY): Payer: Medicare Other

## 2017-03-10 ENCOUNTER — Ambulatory Visit (INDEPENDENT_AMBULATORY_CARE_PROVIDER_SITE_OTHER): Payer: Medicare Other | Admitting: Internal Medicine

## 2017-03-10 ENCOUNTER — Encounter: Payer: Self-pay | Admitting: Internal Medicine

## 2017-03-10 DIAGNOSIS — M79602 Pain in left arm: Secondary | ICD-10-CM | POA: Insufficient documentation

## 2017-03-10 NOTE — Assessment & Plan Note (Signed)
Removed 3 sutures today however given the 14 days that had elapsed they had grown into the skin. No bleeding and tolerated well. Given bandage and can continue using ice and otc pain meds for the area.

## 2017-03-10 NOTE — Progress Notes (Signed)
   Subjective:    Patient ID: [name], female    DOB: 1960-06-13, 57 y.o.   MRN: [id number]  HPI The patient is a 57 YO female coming in for left arm pain. Had lesion removal with sutures in her arm about 14 days ago. 3 stitches done by her dermatologist. They gave her a kit and told her to take them out. Still some pain in the area. She denies redness or swelling. She has sarcoidosis and this was related to that and painful. She has been using ice on the area.   Review of Systems  Constitutional: Negative for activity change, appetite change, chills, fatigue, fever and unexpected weight change.  Respiratory: Negative.   Cardiovascular: Negative.   Gastrointestinal: Negative.   Musculoskeletal: Positive for myalgias. Negative for arthralgias, back pain, gait problem and joint swelling.  Skin: Positive for wound.  Neurological: Negative.       Objective:   Physical Exam  Constitutional: She is oriented to person, place, and time. She appears well-developed and well-nourished.  HENT:  Head: Normocephalic and atraumatic.  Eyes: EOM are normal.  Neck: Normal range of motion.  Cardiovascular: Normal rate and regular rhythm.  Pulmonary/Chest: Effort normal and breath sounds normal. No respiratory distress. She has no wheezes. She has no rales.  Abdominal: Soft. Bowel sounds are normal. She exhibits no distension. There is no tenderness. There is no rebound.  Musculoskeletal: She exhibits no edema.  Neurological: She is alert and oriented to person, place, and time. Coordination normal.  Skin: Skin is warm and dry.  1 cm incision on the posterior upper left arm, 3 black sutures removed without complication, no redness surrounding or fluctuance, no cellulitis.   Psychiatric: She has a normal mood and affect.   Vitals:   03/10/17 1456  BP: (!) 150/90  Pulse: 77  Temp: 97.8 F (36.6 C)  TempSrc: Axillary  SpO2: 98%  Weight: 286 lb (129.7 kg)  Height: '5\' 7"'$  (1.702 m)    Suture removal, 3 sutures posterior upper left arm, no bleeding with removal and tolerated well, no complications, no numbing agent used    Assessment & Plan:

## 2017-03-10 NOTE — Patient Instructions (Signed)
We have gotten the stitches out today and will give you the antibiotic ointment.

## 2017-03-11 ENCOUNTER — Encounter (HOSPITAL_COMMUNITY): Payer: Medicare Other

## 2017-03-12 ENCOUNTER — Other Ambulatory Visit: Payer: Self-pay | Admitting: Pharmacist

## 2017-03-12 NOTE — Patient Outreach (Addendum)
[place] [facility] [place] [facility]) Care Management  03/12/2017  [place] [birth year] [id number]   Patient was called to follow up on the betamethasone ointment. HIPAA identifiers were obtained. Patient said she did not receive her medication. [facility] was called. The Pharmacy Technician, Steffanie Dunn said the betamethasone ointment and Venlafaxine were ready to be delivered and would go out today.  I called the patient back to let her know her medications would be delivered to her today. During the call back, the patient said she received a letter from [place] stating her Venlafaxine XR would no longer be covered.    [employer] was called with the patient on conference called. The representative from [place] confirmed Venlafaxine XR is no longer covered. Venlafaxine HCL 75mg  is covered.  Patient has an appointment with Dr. Alain Marion on 03/21/17 and said she wanted to discuss he medication changes at that visit.  [facility] was able to fill a 30 day supply of Venlafaxine today and it will be delivered to her home.  Plan: Call patient after her appointment with Dr. Alain Marion 03/22/17. Route note to Dr. Alain Marion. Close patient's case once Venlafaxine issue is resolved.   Elayne Guerin, PharmD, [place] Clinical Pharmacist [phone]

## 2017-03-13 ENCOUNTER — Encounter (HOSPITAL_COMMUNITY): Payer: Medicare Other

## 2017-03-18 ENCOUNTER — Encounter (HOSPITAL_COMMUNITY)
Admission: RE | Admit: 2017-03-18 | Discharge: 2017-03-18 | Disposition: A | Discharge status: Home / Self Care | Payer: Medicare Other | Source: Ambulatory Visit | Attending: Pulmonary Disease | Admitting: Pulmonary Disease

## 2017-03-18 ENCOUNTER — Encounter (HOSPITAL_COMMUNITY): Payer: Medicare Other

## 2017-03-18 DIAGNOSIS — J449 Chronic obstructive pulmonary disease, unspecified: Secondary | ICD-10-CM | POA: Insufficient documentation

## 2017-03-18 DIAGNOSIS — D869 Sarcoidosis, unspecified: Secondary | ICD-10-CM | POA: Diagnosis not present

## 2017-03-18 NOTE — Progress Notes (Signed)
Pulmonary Individual Treatment Plan  Patient Details  Name: [name] MRN: [id number] Date of Birth: 31-May-1960 Referring Provider:     Pulmonary Rehab Walk Test from 03/18/2017 in [place]  Referring Provider  Dr. Elsworth Soho      Initial Encounter Date:    Pulmonary Rehab Walk Test from 03/18/2017 in [place]  Date  03/18/17  Referring Provider  Dr. Elsworth Soho      Visit Diagnosis: No diagnosis found.  Patient's Home Medications on Admission:   Current Outpatient Medications:  .  Adalimumab (HUMIRA) 40 MG/0.8ML PSKT, Inject 40 mg into the skin every 14 (fourteen) days., Disp: , Rfl:  .  budesonide-formoterol (SYMBICORT) 160-4.5 MCG/ACT inhaler, Inhale 2 puffs into the lungs 2 (two) times daily., Disp: 1 Inhaler, Rfl: 0 .  carvedilol (COREG) 12.5 MG tablet, TAKE 1 TABLET BY MOUTH TWICE DAILY WITH A MEAL, Disp: 180 tablet, Rfl: 1 .  chlorhexidine (PERIDEX) 0.12 % solution, Use as directed 5 mLs in the mouth or throat 2 (two) times daily. Pt is to swish and spit., Disp: 120 mL, Rfl: 0 .  clobetasol ointment (TEMOVATE) 0.05 %, Apply topically. Apply topically two times day, Disp: , Rfl:  .  clonazePAM (KLONOPIN) 0.5 MG tablet, Take 1-2 tablets BY MOUTH at bedtime, Disp: 60 tablet, Rfl: 2 .  esomeprazole (NEXIUM) 40 MG capsule, Take 1 capsule (40 mg total) by mouth daily before breakfast., Disp: 90 capsule, Rfl: 3 .  furosemide (LASIX) 40 MG tablet, TAKE 1 TABLET BY MOUTH EVERY MORNING FOR EDEMA, Disp: 90 tablet, Rfl: 1 .  gabapentin (NEURONTIN) 300 MG capsule, Take 1 capsule (300 mg total) by mouth 2 (two) times daily as needed (for pain)., Disp: 180 capsule, Rfl: 1 .  HYDROcodone-acetaminophen (NORCO) 7.5-325 MG tablet, Take 1 tablet by mouth every 4 (four) hours as needed for moderate pain or severe pain., Disp: 30 tablet, Rfl: 0 .  lidocaine (XYLOCAINE) 2 % solution, Use as directed 20 mLs in the mouth or throat 3  (three) times daily as needed for mouth pain. Pt is to swish and spit., Disp: , Rfl:  .  metFORMIN (GLUCOPHAGE-XR) 500 MG 24 hr tablet, TAKE 2 TABLETS(1000 MG) BY MOUTH DAILY WITH BREAKFAST, Disp: 180 tablet, Rfl: 0 .  potassium chloride (KLOR-CON 10) 10 MEQ tablet, Take 1 tablet (10 mEq total) by mouth daily., Disp: 90 tablet, Rfl: 3 .  sitaGLIPtin (JANUVIA) 50 MG tablet, Take 1 tablet (50 mg total) by mouth daily., Disp: 90 tablet, Rfl: 3 .  SYMBICORT 160-4.5 MCG/ACT inhaler, Inhale 2 puffs into the lungs TWICE DAILY 120/4=30, Disp: 10.2 g, Rfl: 5 .  topiramate (TOPAMAX) 50 MG tablet, Take 1 tablet (50 mg total) by mouth 2 (two) times daily., Disp: 60 tablet, Rfl: 0 .  topiramate (TOPAMAX) 50 MG tablet, Take 1 tablet (50 mg total) by mouth 2 (two) times daily., Disp: 180 tablet, Rfl: 1 .  venlafaxine XR (EFFEXOR-XR) 75 MG 24 hr capsule, Take 1 capsule (75 mg total) by mouth daily with breakfast., Disp: 30 capsule, Rfl: 11 .  VENTOLIN HFA 108 (90 Base) MCG/ACT inhaler, Inhale 2 puffs into the lungs every 6 (six) hours as needed for wheezing or shortness of breath. 200/8=25, Disp: 18 g, Rfl: 11 .  VOLTAREN 1 % GEL, Apply 2 g topically 2 (two) times daily as needed (for pain). 100/4=25, Disp: 100 g, Rfl: 2 .  XARELTO 20 MG TABS tablet, Take 1 tablet (20  mg total) by mouth daily. with food, Disp: 90 tablet, Rfl: 0  Past Medical History: Past Medical History:  Diagnosis Date  . Allergic rhinitis   . ALLERGIC RHINITIS 10/26/2009  . Anxiety   . ANXIETY 08/14/2006  . B12 DEFICIENCY 08/25/2007  . Confusion   . Depression    takes Lexapro daily  . Diabetes mellitus without complication ([place])    was on insulin but has been off since Nov 2015 and now only takes Metformin daily  . DYSPNEA 04/28/2009   with exertion  . Esophageal reflux    takes Nexium daily  . Fibromyalgia   . Headache    last migraine 2-77yrs ago;takes Topamax daily  . History of shingles   . Hypertension    takes Coreg daily  .  Insomnia    takes Nortriptyline nightly   . Joint pain   . Joint swelling   . Left knee pain   . Lichen planus   . Long-term memory loss   . Nocturia   . OSA (obstructive sleep apnea)    doesn't use CPAP;sleep study in epic from 2006  . Osteoarthritis   . Osteoarthritis   . Pneumonia    over 30 yrs ago  . Protein calorie malnutrition ([place])   . Rheumatoid arthritis ([place])   . Sarcoidosis    Dr. Lolita Patella  . Short-term memory loss   . Shortness of breath   . TIA (transient ischemic attack)   . Unsteady gait   . Urinary urgency   . Vitamin D deficiency    is supposed to take Vit D but can't afford it  . VITAMIN D DEFICIENCY 08/25/2007    Tobacco Use: Social History   Tobacco Use  Smoking Status Former Smoker  . Packs/day: 0.50  . Years: 10.00  . Pack years: 5.00  . Types: Cigarettes  Smokeless Tobacco Never Used  Tobacco Comment   quit smoking in 2004    Labs: Recent Review Flowsheet Data    Labs for ITP Cardiac and Pulmonary Rehab Latest Ref Rng & Units 02/01/2015 04/03/2015 07/04/2015 01/30/2016 07/22/2016   Cholestrol 0 - 200 mg/dL - - - - -   LDLCALC 0 - 99 mg/dL - - - - -   HDL >39.00 mg/dL - - - - -   Trlycerides 0.0 - 149.0 mg/dL - - - - -   Hemoglobin A1c 4.8 - 5.6 % 5.8 6.0 5.8(H) 5.9 7.1(H)      Capillary Blood Glucose: Lab Results  Component Value Date   GLUCAP 198 (H) 07/23/2016   GLUCAP 133 (H) 07/23/2016   GLUCAP 130 (H) 07/22/2016   GLUCAP 153 (H) 07/22/2016   GLUCAP 229 (H) 07/22/2016     Pulmonary Assessment Scores: Pulmonary Assessment Scores    Row Name 03/18/17 1633         ADL UCSD   ADL Phase  Entry       mMRC Score   mMRC Score  2        Pulmonary Function Assessment: Pulmonary Function Assessment - 02/17/17 1511      Breath   Bilateral Breath Sounds  Clear    Shortness of Breath  Yes;Limiting activity       Exercise Target Goals: Date: 03/18/17  Exercise Program Goal: Individual exercise prescription set using  results from initial 6 min walk test and THRR while considering  patient's activity barriers and safety.    Exercise Prescription Goal: Initial exercise prescription builds to 30-45 minutes a day  of aerobic activity, 2-3 days per week.  Home exercise guidelines will be given to patient during program as part of exercise prescription that the participant will acknowledge.  Activity Barriers & Risk Stratification: Activity Barriers & Cardiac Risk Stratification - 02/17/17 1454      Activity Barriers & Cardiac Risk Stratification   Activity Barriers  Left Knee Replacement;Right Knee Replacement;Joint Problems;Deconditioning;History of Falls;Shortness of Breath;Assistive Device;Back Problems       6 Minute Walk: 6 Minute Walk    Row Name 03/18/17 1626         6 Minute Walk   Phase  Initial     Distance  770 feet     Walk Time  6 minutes     # of Rest Breaks  0     MPH  1.45     METS  2.15     RPE  13     Perceived Dyspnea   1     Symptoms  Yes (comment)     Comments  wheelchair--9/10 back pain     Resting HR  75 bpm     Resting BP  130/82     Resting Oxygen Saturation   95 %     Exercise Oxygen Saturation  during 6 min walk  85 %     Max Ex. HR  106 bpm     Max Ex. BP  140/90       Interval HR   1 Minute HR  92     2 Minute HR  99     3 Minute HR  99     4 Minute HR  102     5 Minute HR  100     6 Minute HR  106     2 Minute Post HR  91     Interval Heart Rate?  Yes       Interval Oxygen   Interval Oxygen?  Yes     Baseline Oxygen Saturation %  95 %     1 Minute Oxygen Saturation %  94 %     1 Minute Liters of Oxygen  2 L     2 Minute Oxygen Saturation %  88 %     2 Minute Liters of Oxygen  2 L     3 Minute Oxygen Saturation %  88 %     3 Minute Liters of Oxygen  2 L     4 Minute Oxygen Saturation %  85 %     4 Minute Liters of Oxygen  2 L     5 Minute Oxygen Saturation %  85 %     5 Minute Liters of Oxygen  4 L     6 Minute Oxygen Saturation %  86 %     6  Minute Liters of Oxygen  4 L     2 Minute Post Oxygen Saturation %  95 %     2 Minute Post Liters of Oxygen  4 L        Oxygen Initial Assessment: Oxygen Initial Assessment - 03/18/17 1632      Initial 6 min Walk   Oxygen Used  Continuous    Liters per minute  2      Program Oxygen Prescription   Program Oxygen Prescription  Continuous    Liters per minute  -- 4-6    Comments  patient desaturated to 85% on 4 liters while walking--will re-evaluate on first day of rehab  Oxygen Re-Evaluation:   Oxygen Discharge (Final Oxygen Re-Evaluation):   Initial Exercise Prescription: Initial Exercise Prescription - 03/18/17 1600      Date of Initial Exercise RX and Referring Provider   Date  03/18/17    Referring Provider  Dr. Elsworth Soho      Oxygen   Oxygen  Continuous    Liters  -- 4-6      NuStep   Level  2    SPM  80    Minutes  17    METs  1.5      Track   Laps  5    Minutes  17      Prescription Details   Frequency (times per week)  2    Duration  Progress to 45 minutes of aerobic exercise without signs/symptoms of physical distress      Intensity   THRR 40-80% of Max Heartrate  66-132    Ratings of Perceived Exertion  11-13    Perceived Dyspnea  0-4      Progression   Progression  Continue to progress workloads to maintain intensity without signs/symptoms of physical distress.      Resistance Training   Training Prescription  Yes    Weight  orange bands    Reps  10-15       Perform Capillary Blood Glucose checks as needed.  Exercise Prescription Changes:   Exercise Comments:   Exercise Goals and Review: Exercise Goals    Row Name 02/17/17 1454             Exercise Goals   Increase Physical Activity  Yes       Intervention  Provide advice, education, support and counseling about physical activity/exercise needs.       Expected Outcomes  Short Term: Attend rehab on a regular basis to increase amount of physical activity.       Increase  Strength and Stamina  Yes       Intervention  Provide advice, education, support and counseling about physical activity/exercise needs.;Develop an individualized exercise prescription for aerobic and resistive training based on initial evaluation findings, risk stratification, comorbidities and participant's personal goals.       Expected Outcomes  Short Term: Increase workloads from initial exercise prescription for resistance, speed, and METs.       Able to understand and use rate of perceived exertion (RPE) scale  Yes       Intervention  Provide education and explanation on how to use RPE scale       Expected Outcomes  Short Term: Able to use RPE daily in rehab to express subjective intensity level;Long Term:  Able to use RPE to guide intensity level when exercising independently       Able to understand and use Dyspnea scale  Yes       Intervention  Provide education and explanation on how to use Dyspnea scale       Expected Outcomes  Short Term: Able to use Dyspnea scale daily in rehab to express subjective sense of shortness of breath during exertion;Long Term: Able to use Dyspnea scale to guide intensity level when exercising independently       Knowledge and understanding of Target Heart Rate Range (THRR)  Yes       Intervention  Provide education and explanation of THRR including how the numbers were predicted and where they are located for reference       Expected Outcomes  Short Term: Able to state/look up  THRR;Short Term: Able to use daily as guideline for intensity in rehab;Long Term: Able to use THRR to govern intensity when exercising independently       Understanding of Exercise Prescription  Yes       Intervention  Provide education, explanation, and written materials on patient's individual exercise prescription       Expected Outcomes  Short Term: Able to explain program exercise prescription;Long Term: Able to explain home exercise prescription to exercise independently           Exercise Goals Re-Evaluation :   Discharge Exercise Prescription (Final Exercise Prescription Changes):   Nutrition:  Target Goals: Understanding of nutrition guidelines, daily intake of sodium 1500mg , cholesterol 200mg , calories 30% from fat and 7% or less from saturated fats, daily to have 5 or more servings of fruits and vegetables.  Biometrics: Pre Biometrics - 02/17/17 1522      Pre Biometrics   Grip Strength  21 kg        Nutrition Therapy Plan and Nutrition Goals:   Nutrition Assessments:   Nutrition Goals Re-Evaluation:   Nutrition Goals Discharge (Final Nutrition Goals Re-Evaluation):   Psychosocial: Target Goals: Acknowledge presence or absence of significant depression and/or stress, maximize coping skills, provide positive support system. Participant is able to verbalize types and ability to use techniques and skills needed for reducing stress and depression.  Initial Review & Psychosocial Screening: Initial Psych Review & Screening - 02/17/17 1512      Initial Review   Current issues with  Current Depression;History of Depression;Current Psychotropic Meds      Family Dynamics   Good Support System?  No    Concerns  Inappropriate over/under dependence on family/friends    Comments  4 siblings, 1 son but none attend to patients needs      Barriers   Psychosocial barriers to participate in program  There are no identifiable barriers or psychosocial needs.;The patient should benefit from training in stress management and relaxation.      Screening Interventions   Interventions  Encouraged to exercise       Quality of Life Scores:  Scores of 19 and below usually indicate a poorer quality of life in these areas.  A difference of  2-3 points is a clinically meaningful difference.  A difference of 2-3 points in the total score of the Quality of Life Index has been associated with significant improvement in overall quality of life, self-image,  physical symptoms, and general health in studies assessing change in quality of life.   PHQ-9: Recent Review Flowsheet Data    Depression screen [facility] 2/9 02/17/2017 01/15/2017 12/19/2016 11/06/2016 10/24/2016   Decreased Interest 1 1 3 1  0   Down, Depressed, Hopeless 1 0 2 1 0   PHQ - 2 Score 2 1 5 2  0   Altered sleeping 1 - 0 2 -   Tired, decreased energy 3 - 3 2 -   Change in appetite 1 - 1 1 -   Feeling bad or failure about yourself  0 - 0 0 -   Trouble concentrating 0 - 0 0 -   Moving slowly or fidgety/restless 0 - 0 0 -   Suicidal thoughts 0 - 0 0 -   PHQ-9 Score 7 - 9 7 -   Difficult doing work/chores - - - Not difficult at all -     Interpretation of Total Score  Total Score Depression Severity:  1-4 = Minimal depression, 5-9 = Mild depression, 10-14 =  Moderate depression, 15-19 = Moderately severe depression, 20-27 = Severe depression   Psychosocial Evaluation and Intervention: Psychosocial Evaluation - 02/17/17 1515      Psychosocial Evaluation & Interventions   Interventions  Encouraged to exercise with the program and follow exercise prescription    Expected Outcomes  Patients psychosocial issues will not become barriers to participation     Continue Psychosocial Services   Follow up required by staff       Psychosocial Re-Evaluation:   Psychosocial Discharge (Final Psychosocial Re-Evaluation):   Education: Education Goals: Education classes will be provided on a weekly basis, covering required topics. Participant will state understanding/return demonstration of topics presented.  Learning Barriers/Preferences:   Education Topics: Risk Factor Reduction:  -Group instruction that is supported by a PowerPoint presentation. Instructor discusses the definition of a risk factor, different risk factors for pulmonary disease, and how the heart and lungs work together.     Nutrition for Pulmonary Patient:  -Group instruction provided by PowerPoint slides, verbal  discussion, and written materials to support subject matter. The instructor gives an explanation and review of healthy diet recommendations, which includes a discussion on weight management, recommendations for fruit and vegetable consumption, as well as protein, fluid, caffeine, fiber, sodium, sugar, and alcohol. Tips for eating when patients are short of breath are discussed.   Pursed Lip Breathing:  -Group instruction that is supported by demonstration and informational handouts. Instructor discusses the benefits of pursed lip and diaphragmatic breathing and detailed demonstration on how to preform both.     Oxygen Safety:  -Group instruction provided by PowerPoint, verbal discussion, and written material to support subject matter. There is an overview of "What is Oxygen" and "Why do we need it".  Instructor also reviews how to create a safe environment for oxygen use, the importance of using oxygen as prescribed, and the risks of noncompliance. There is a brief discussion on traveling with oxygen and resources the patient may utilize.   Oxygen Equipment:  -Group instruction provided by [facility] Staff utilizing handouts, written materials, and equipment demonstrations.   Signs and Symptoms:  -Group instruction provided by written material and verbal discussion to support subject matter. Warning signs and symptoms of infection, stroke, and heart attack are reviewed and when to call the physician/911 reinforced. Tips for preventing the spread of infection discussed.   Advanced Directives:  -Group instruction provided by verbal instruction and written material to support subject matter. Instructor reviews Advanced Directive laws and proper instruction for filling out document.   Pulmonary Video:  -Group video education that reviews the importance of medication and oxygen compliance, exercise, good nutrition, pulmonary hygiene, and pursed lip and diaphragmatic breathing for the pulmonary  patient.   Exercise for the Pulmonary Patient:  -Group instruction that is supported by a PowerPoint presentation. Instructor discusses benefits of exercise, core components of exercise, frequency, duration, and intensity of an exercise routine, importance of utilizing pulse oximetry during exercise, safety while exercising, and options of places to exercise outside of rehab.     Pulmonary Medications:  -Verbally interactive group education provided by instructor with focus on inhaled medications and proper administration.   Anatomy and Physiology of the Respiratory System and Intimacy:  -Group instruction provided by PowerPoint, verbal discussion, and written material to support subject matter. Instructor reviews respiratory cycle and anatomical components of the respiratory system and their functions. Instructor also reviews differences in obstructive and restrictive respiratory diseases with examples of each. Intimacy, Sex, and Sexuality differences are reviewed  with a discussion on how relationships can change when diagnosed with pulmonary disease. Common sexual concerns are reviewed.   MD DAY -A group question and answer session with a medical doctor that allows participants to ask questions that relate to their pulmonary disease state.   OTHER EDUCATION -Group or individual verbal, written, or video instructions that support the educational goals of the pulmonary rehab program.   Holiday Eating Survival Tips:  -Group instruction provided by PowerPoint slides, verbal discussion, and written materials to support subject matter. The instructor gives patients tips, tricks, and techniques to help them not only survive but enjoy the holidays despite the onslaught of food that accompanies the holidays.   Knowledge Questionnaire Score:   Core Components/Risk Factors/Patient Goals at Admission: Personal Goals and Risk Factors at Admission - 02/17/17 1511      Core Components/Risk  Factors/Patient Goals on Admission    Weight Management  Obesity;Yes    Intervention  Obesity: Provide education and appropriate resources to help participant work on and attain dietary goals.;Weight Management/Obesity: Establish reasonable short term and long term weight goals.    Expected Outcomes  Short Term: Continue to assess and modify interventions until short term weight is achieved;Understanding of distribution of calorie intake throughout the day with the consumption of 4-5 meals/snacks;Understanding recommendations for meals to include 15-35% energy as protein, 25-35% energy from fat, 35-60% energy from carbohydrates, less than 200mg  of dietary cholesterol, 20-35 gm of total fiber daily;Weight Loss: Understanding of general recommendations for a balanced deficit meal plan, which promotes 1-2 lb weight loss per week and includes a negative energy balance of [phone] kcal/d    Improve shortness of breath with ADL's  Yes    Intervention  Provide education, individualized exercise plan and daily activity instruction to help decrease symptoms of SOB with activities of daily living.    Expected Outcomes  Short Term: Improve cardiorespiratory fitness to achieve a reduction of symptoms when performing ADLs;Long Term: Be able to perform more ADLs without symptoms or delay the onset of symptoms       Core Components/Risk Factors/Patient Goals Review:    Core Components/Risk Factors/Patient Goals at Discharge (Final Review):    ITP Comments:   Comments:

## 2017-03-20 ENCOUNTER — Encounter (HOSPITAL_COMMUNITY): Payer: Medicare Other

## 2017-03-21 ENCOUNTER — Ambulatory Visit: Payer: Medicare Other | Admitting: Internal Medicine

## 2017-03-24 ENCOUNTER — Ambulatory Visit: Payer: Self-pay | Admitting: Pharmacist

## 2017-03-25 ENCOUNTER — Encounter (HOSPITAL_COMMUNITY)
Admission: RE | Admit: 2017-03-25 | Discharge: 2017-03-25 | Disposition: A | Discharge status: Home / Self Care | Payer: Medicare Other | Source: Ambulatory Visit | Attending: Pulmonary Disease | Admitting: Pulmonary Disease

## 2017-03-25 DIAGNOSIS — D869 Sarcoidosis, unspecified: Secondary | ICD-10-CM

## 2017-03-25 DIAGNOSIS — J449 Chronic obstructive pulmonary disease, unspecified: Secondary | ICD-10-CM | POA: Diagnosis not present

## 2017-03-25 NOTE — Progress Notes (Signed)
Daily Session Note  Patient Details  Name: [name] MRN: [id number] Date of Birth: 1960-11-02 Referring Provider:     Pulmonary Rehab Walk Test from 03/18/2017 in [place]  Referring Provider  Dr. Elsworth Soho      Encounter Date: 03/25/2017  Check In: Session Check In - 03/25/17 1330      Check-In   Location  [facility]    Staff Present  Su Hilt, MS, ACSM RCEP, Exercise Physiologist;Portia Rollene Rotunda, RN, Roque Cash, RN;Other    Supervising physician immediately available to respond to emergencies  [facility] immediately available    Physician(s)   Dr. Horris Latino    Medication changes reported      No    Fall or balance concerns reported     No    Tobacco Cessation  No Change    Warm-up and Cool-down  Not performed (comment)    Resistance Training Performed  Yes    VAD Patient?  No      Pain Assessment   Currently in Pain?  No/denies    Multiple Pain Sites  No       Capillary Blood Glucose: No results found for this or any previous visit (from the past 24 hour(s)).    Social History   Tobacco Use  Smoking Status Former Smoker  . Packs/day: 0.50  . Years: 10.00  . Pack years: 5.00  . Types: Cigarettes  Smokeless Tobacco Never Used  Tobacco Comment   quit smoking in 2004    Goals Met:  Exercise tolerated well No report of cardiac concerns or symptoms Strength training completed today  Goals Unmet:  Not Applicable  Comments: Service time is from 1330 to 1530    Dr. Rush Farmer is Medical Director for Pulmonary Rehab at [facility].

## 2017-03-26 ENCOUNTER — Encounter: Payer: Self-pay | Admitting: Pharmacist

## 2017-03-26 ENCOUNTER — Other Ambulatory Visit: Payer: Self-pay | Admitting: Pharmacist

## 2017-03-26 NOTE — Patient Outreach (Signed)
[facility]) Care Management  03/26/2017  Tanaya Dunigan [facility] [birth year] [id number]   Patient was called to follow up after her PCP visit on 03/21/17.  HIPAA identifiers were obtained.  Patient said she had to cancel her PCP appointment on 03/21/17 due to transportation issues. Patient rescheduled her appointment for 04/09/17.  Patient said she has a 90 day supply of Venlafaxine in her possession. Patient said she will discuss with her provider at her next visit.  [employer] was called 03/12/17 with the patient on conference call. The representative from [facility] confirmed Venlafaxine XR is no longer covered. Venlafaxine HCL 75mg  is covered.  Plan: Route note to Dr. Alain Marion Follow up with patient after her visit.   Elayne Guerin, PharmD, [place] Clinical Pharmacist [phone]

## 2017-03-27 ENCOUNTER — Encounter (HOSPITAL_COMMUNITY)
Admission: RE | Admit: 2017-03-27 | Discharge: 2017-03-27 | Disposition: A | Discharge status: Home / Self Care | Payer: Medicare Other | Source: Ambulatory Visit | Attending: Pulmonary Disease | Admitting: Pulmonary Disease

## 2017-03-27 DIAGNOSIS — D869 Sarcoidosis, unspecified: Secondary | ICD-10-CM | POA: Diagnosis not present

## 2017-03-27 DIAGNOSIS — J449 Chronic obstructive pulmonary disease, unspecified: Secondary | ICD-10-CM | POA: Diagnosis not present

## 2017-03-27 NOTE — Progress Notes (Signed)
Daily Session Note  Patient Details  Name: [name] MRN: [id number] Date of Birth: 05/16/1960 Referring Provider:     Pulmonary Rehab Walk Test from 03/18/2017 in [place]  Referring Provider  Dr. Elsworth Soho      Encounter Date: 03/27/2017  Check In: Session Check In - 03/27/17 1355      Check-In   Location  [facility]    Staff Present  Su Hilt, MS, ACSM RCEP, Exercise Physiologist;Layn Kye Ysidro Evert, RN;Portia Rollene Rotunda, RN, BSN    Supervising physician immediately available to respond to emergencies  [facility] immediately available    Physician(s)  Dr. Wendee Beavers    Medication changes reported      No    Fall or balance concerns reported     No    Tobacco Cessation  No Change    Warm-up and Cool-down  Performed as group-led instruction    Resistance Training Performed  Yes    VAD Patient?  No      Pain Assessment   Currently in Pain?  No/denies    Multiple Pain Sites  No       Capillary Blood Glucose: No results found for this or any previous visit (from the past 24 hour(s)).    Social History   Tobacco Use  Smoking Status Former Smoker  . Packs/day: 0.50  . Years: 10.00  . Pack years: 5.00  . Types: Cigarettes  Smokeless Tobacco Never Used  Tobacco Comment   quit smoking in 2004    Goals Met:  Exercise tolerated well No report of cardiac concerns or symptoms Strength training completed today  Goals Unmet:  Not Applicable  Comments: Service time is from 1330 to 1545    Dr. Rush Farmer is Medical Director for Pulmonary Rehab at [facility].

## 2017-03-28 ENCOUNTER — Ambulatory Visit: Payer: Self-pay | Admitting: Pharmacist

## 2017-03-31 ENCOUNTER — Ambulatory Visit (INDEPENDENT_AMBULATORY_CARE_PROVIDER_SITE_OTHER): Payer: Medicare Other | Admitting: Adult Health

## 2017-03-31 ENCOUNTER — Encounter: Payer: Self-pay | Admitting: Adult Health

## 2017-03-31 DIAGNOSIS — I2782 Chronic pulmonary embolism: Secondary | ICD-10-CM | POA: Diagnosis not present

## 2017-03-31 DIAGNOSIS — J9611 Chronic respiratory failure with hypoxia: Secondary | ICD-10-CM | POA: Diagnosis not present

## 2017-03-31 DIAGNOSIS — D869 Sarcoidosis, unspecified: Secondary | ICD-10-CM

## 2017-03-31 NOTE — Assessment & Plan Note (Signed)
Cont on O2  POC order

## 2017-03-31 NOTE — Assessment & Plan Note (Signed)
Compensated on present regimen no flare noted  Plan  Patient Instructions  Continue on Symbicort Twice daily  .  Mucinex DM Twice daily  As needed  Cough/congestion  Continue on Oxygen 2l/m .  Continue on Xarelto .  Continue with pulmonary rehab.  Follow up Dr. Elsworth Soho in 4 months , with Spirometry with DLCO .  Please contact office for sooner follow up if symptoms do not improve or worsen or seek emergency care

## 2017-03-31 NOTE — Addendum Note (Signed)
Addended by: Parke Poisson E on: 03/31/2017 10:43 AM   Modules accepted: Orders

## 2017-03-31 NOTE — Assessment & Plan Note (Signed)
Cont on Xarelto  

## 2017-03-31 NOTE — Progress Notes (Signed)
@Patient  ID: [name], female    DOB: 1960-09-07, 57 y.o.   MRN: [id number]  Chief Complaint  Patient presents with  . 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   LE Venous Doppler 08/18/15 >>neg DVT , no bakers cyst, ?prominent inguinal lymph node measuring 3.1cm   MRI brain neg [facility] = MRI spine = mild to moderate disc protrusion/left-sided spinal stenosis-chronic pain  NPSG 12/2004 mild, RDI 13/h  03/31/2017 Follow up : Sarcoid , PE (lifelong anticoagulation) , O2 RF  Patient returns for a 41-month follow-up.  Patient has underlying sarcoidosis.  Patient says her breathing has been doing okay. No increased cough or dyspnea . Does have some nasal drainage.  Chest x-ray November 2018 showed chronic sarcoid changes. Remains on Symbicort .   Patient remains on oxygen 2 L . [name] Going to pulmonary rehab . Says it tough.  Needs a small POC to help , cant not carry heavy tanks . Needs lightweight O2 tank to be more active . Does not want to miss out on things so needs a POC to help with qol.   Patient has a history of recurrent PE and DVT.  She is on lifelong anticoagulation with Xarelto.  Patient says she has been doing good. She denies any known  bleeding.     Allergies  Allergen Reactions  . Belsomra [Suvorexant]     falling out of bed in the sleep: "I'm waking up as I'm falling on the floor"   . Enalapril Maleate Cough  . Hydrochlorothiazide Other (See Comments)    Reaction:  Low potassium levels   . Hydroxychloroquine Sulfate Other (See Comments)    Reaction:  Vision changes   . Iodine   . Latex Hives  . Nickel Other (See Comments)    Reaction:  Blisters     Immunization History  Administered Date(s) Administered  . H1N1 12/24/2007  . Influenza Split 10/11/2010  . Influenza Whole 10/14/2007, 10/05/2008, 10/26/2009, 11/13/2011  . Influenza,inj,Quad PF,6+ Mos 09/21/2012, 09/24/2013, 10/06/2014, 09/05/2015, 11/06/2016  . PPD Test 11/18/2014  . Pneumococcal Conjugate-13 07/30/2010, 12/29/2012  . Pneumococcal Polysaccharide-23 12/23/2013  . Tdap 06/28/2014    Past Medical History:  Diagnosis Date  . Allergic rhinitis   . ALLERGIC RHINITIS 10/26/2009  . Anxiety   . ANXIETY 08/14/2006  . B12 DEFICIENCY 08/25/2007  . Confusion   . Depression    takes Lexapro daily  . Diabetes mellitus without complication ([place])    was on insulin but has been off since Nov 2015 and now only takes Metformin daily  . DYSPNEA 04/28/2009   with exertion  . Esophageal reflux    takes Nexium daily  . Fibromyalgia   . Headache    last migraine 2-4yrs ago;takes Topamax daily  . History  of shingles   . Hypertension    takes Coreg daily  . Insomnia    takes Nortriptyline nightly   . Joint pain   . Joint swelling   . Left knee pain   . Lichen planus   . Long-term memory loss   . Nocturia   . OSA (obstructive sleep apnea)    doesn't use CPAP;sleep study in epic from 2006  . Osteoarthritis   . Osteoarthritis   . Pneumonia    over 30 yrs ago  . Protein calorie malnutrition ([place])   . Rheumatoid arthritis ([place])   . Sarcoidosis    Dr. Lolita Patella  . Short-term memory loss   . Shortness of breath   . TIA (transient ischemic attack)    . Unsteady gait   . Urinary urgency   . Vitamin D deficiency    is supposed to take Vit D but can't afford it  . VITAMIN D DEFICIENCY 08/25/2007    Tobacco History: Social History   Tobacco Use  Smoking Status Former Smoker  . Packs/day: 0.50  . Years: 10.00  . Pack years: 5.00  . Types: Cigarettes  Smokeless Tobacco Never Used  Tobacco Comment   quit smoking in 2004   Counseling given: Not Answered Comment: quit smoking in 2004   Outpatient Encounter Medications as of 03/31/2017  Medication Sig  . Adalimumab (HUMIRA) 40 MG/0.8ML PSKT Inject 40 mg into the skin every 14 (fourteen) days.  . budesonide-formoterol (SYMBICORT) 160-4.5 MCG/ACT inhaler Inhale 2 puffs into the lungs 2 (two) times daily.  . carvedilol (COREG) 12.5 MG tablet TAKE 1 TABLET BY MOUTH TWICE DAILY WITH A MEAL  . chlorhexidine (PERIDEX) 0.12 % solution Use as directed 5 mLs in the mouth or throat 2 (two) times daily. Pt is to swish and spit.  . clobetasol ointment (TEMOVATE) 0.05 % Apply topically. Apply topically two times day  . clonazePAM (KLONOPIN) 0.5 MG tablet Take 1-2 tablets BY MOUTH at bedtime  . esomeprazole (NEXIUM) 40 MG capsule Take 1 capsule (40 mg total) by mouth daily before breakfast.  . furosemide (LASIX) 40 MG tablet TAKE 1 TABLET BY MOUTH EVERY MORNING FOR EDEMA  . gabapentin (NEURONTIN) 300 MG capsule Take 1 capsule (300 mg total) by mouth 2 (two) times daily as needed (for pain).  [name] HYDROcodone-acetaminophen (NORCO) 7.5-325 MG tablet Take 1 tablet by mouth every 4 (four) hours as needed for moderate pain or severe pain.  [name] lidocaine (XYLOCAINE) 2 % solution Use as directed 20 mLs in the mouth or throat 3 (three) times daily as needed for mouth pain. Pt is to swish and spit.  . metFORMIN (GLUCOPHAGE-XR) 500 MG 24 hr tablet TAKE 2 TABLETS(1000 MG) BY MOUTH DAILY WITH BREAKFAST  . potassium chloride (KLOR-CON 10) 10 MEQ tablet Take 1 tablet (10 mEq total) by mouth daily.  . sitaGLIPtin  (JANUVIA) 50 MG tablet Take 1 tablet (50 mg total) by mouth daily.  . SYMBICORT 160-4.5 MCG/ACT inhaler Inhale 2 puffs into the lungs TWICE DAILY 120/4=30  . topiramate (TOPAMAX) 50 MG tablet Take 1 tablet (50 mg total) by mouth 2 (two) times daily.  [name] venlafaxine XR (EFFEXOR-XR) 75 MG 24 hr capsule Take 1 capsule (75 mg total) by mouth daily with breakfast.  . VENTOLIN HFA 108 (90 Base) MCG/ACT inhaler Inhale 2 puffs into the lungs every 6 (six) hours as needed for wheezing or shortness of breath. 200/8=25  . VOLTAREN 1 % GEL Apply 2 g topically 2 (two) times  daily as needed (for pain). 100/4=25  . XARELTO 20 MG TABS tablet Take 1 tablet (20 mg total) by mouth daily. with food  . [DISCONTINUED] potassium chloride (K-DUR,KLOR-CON) 10 MEQ tablet TAKE 1 TABLET BY MOUTH DAILY  . [DISCONTINUED] topiramate (TOPAMAX) 50 MG tablet Take 1 tablet (50 mg total) by mouth 2 (two) times daily.   No facility-administered encounter medications on file as of 03/31/2017.      Review of Systems  Constitutional:   No  weight loss, night sweats,  Fevers, chills, fatigue, or  lassitude.  HEENT:   No headaches,  Difficulty swallowing,  Tooth/dental problems, or  Sore throat,                No sneezing, itching, ear ache,  +nasal congestion, post nasal drip,   CV:  No chest pain,  Orthopnea, PND, swelling in lower extremities, anasarca, dizziness, palpitations, syncope.   GI  No heartburn, indigestion, abdominal pain, nausea, vomiting, diarrhea, change in bowel habits, loss of appetite, bloody stools.   Resp:   No chest wall deformity  Skin: no rash or lesions.  GU: no dysuria, change in color of urine, no urgency or frequency.  No flank pain, no hematuria   MS:  No joint pain or swelling.  No decreased range of motion.  No back pain.    Physical Exam  BP 124/76 (BP Location: Right Arm, Cuff Size: Large)   Pulse 67   Ht 5\' 7"  (1.702 m)   Wt 289 lb 6.4 oz (131.3 kg)   SpO2 100%   BMI 45.33 kg/m    GEN: A/Ox3; pleasant , NAD, obese , on O2 ,    HEENT:  [place]/AT,  EACs-clear, TMs-wnl, NOSE-clear, THROAT-clear, no lesions, no postnasal drip or exudate noted.   NECK:  Supple w/ fair ROM; no JVD; normal carotid impulses w/o bruits; no thyromegaly or nodules palpated; no lymphadenopathy.    RESP  Decreased BS in bases . no accessory muscle use, no dullness to percussion  CARD:  RRR, no m/r/g, tr  peripheral edema, pulses intact, no cyanosis or clubbing.  GI:   Soft & nt; nml bowel sounds; no organomegaly or masses detected.   Musco: Warm bil, no deformities or joint swelling noted.   Neuro: alert, no focal deficits noted.    Skin: Warm,     Lab Results:  CBC  Imaging: No results found.   Assessment & Plan:   Sarcoidosis Compensated on present regimen no flare noted  Plan  Patient Instructions  Continue on Symbicort Twice daily  .  Mucinex DM Twice daily  As needed  Cough/congestion  Continue on Oxygen 2l/m .  Continue on Xarelto .  Continue with pulmonary rehab.  Follow up Dr. Elsworth Soho in 4 months , with Spirometry with DLCO .  Please contact office for sooner follow up if symptoms do not improve or worsen or seek emergency care       Pulmonary embolism ([place]) Cont on Xarelto    Chronic respiratory failure with hypoxia ([place]) Cont on O2  POC order      Rexene Edison, NP 03/31/2017

## 2017-03-31 NOTE — Patient Instructions (Addendum)
Continue on Symbicort Twice daily  .  Mucinex DM Twice daily  As needed  Cough/congestion  Continue on Oxygen 2l/m .  Continue on Xarelto .  Continue with pulmonary rehab.  Follow up Dr. Elsworth Soho in 4 months , with Spirometry with DLCO .  Please contact office for sooner follow up if symptoms do not improve or worsen or seek emergency care

## 2017-03-31 NOTE — Addendum Note (Signed)
Addended by: Parke Poisson E on: 03/31/2017 11:17 AM   Modules accepted: Orders

## 2017-04-01 ENCOUNTER — Encounter (HOSPITAL_COMMUNITY)
Admission: RE | Admit: 2017-04-01 | Discharge: 2017-04-01 | Disposition: A | Discharge status: Home / Self Care | Payer: Medicare Other | Source: Ambulatory Visit | Attending: Pulmonary Disease | Admitting: Pulmonary Disease

## 2017-04-01 VITALS — Wt 271.2 lb

## 2017-04-01 DIAGNOSIS — J449 Chronic obstructive pulmonary disease, unspecified: Secondary | ICD-10-CM | POA: Diagnosis not present

## 2017-04-01 DIAGNOSIS — D869 Sarcoidosis, unspecified: Secondary | ICD-10-CM

## 2017-04-01 NOTE — Progress Notes (Signed)
Daily Session Note  Patient Details  Name: [name] MRN: [id number] Date of Birth: May 13, 1960 Referring Provider:     Pulmonary Rehab Walk Test from 03/18/2017 in [place]  Referring Provider  Dr. Elsworth Soho      Encounter Date: 04/01/2017  Check In: Session Check In - 04/01/17 1330      Check-In   Location  [facility]    Staff Present  Su Hilt, MS, ACSM RCEP, Exercise Physiologist;Wisdom Rickey Rollene Rotunda, RN, Maxcine Ham, RN, BSN    Supervising physician immediately available to respond to emergencies  [facility] immediately available    Physician(s)  Dr. Wendee Beavers    Medication changes reported      No    Fall or balance concerns reported     No    Tobacco Cessation  No Change    Warm-up and Cool-down  Performed as group-led instruction    Resistance Training Performed  Yes    VAD Patient?  No      Pain Assessment   Currently in Pain?  No/denies    Multiple Pain Sites  No       Capillary Blood Glucose: No results found for this or any previous visit (from the past 24 hour(s)).    Social History   Tobacco Use  Smoking Status Former Smoker  . Packs/day: 0.50  . Years: 10.00  . Pack years: 5.00  . Types: Cigarettes  Smokeless Tobacco Never Used  Tobacco Comment   quit smoking in 2004    Goals Met:  Exercise tolerated well Queuing for purse lip breathing No report of cardiac concerns or symptoms Strength training completed today  Goals Unmet:  Not Applicable  Comments: Service time is from 1330 to 1515   Dr. Rush Farmer is Medical Director for Pulmonary Rehab at [facility].

## 2017-04-02 ENCOUNTER — Encounter (HOSPITAL_COMMUNITY): Payer: Self-pay | Admitting: *Deleted

## 2017-04-02 ENCOUNTER — Ambulatory Visit (INDEPENDENT_AMBULATORY_CARE_PROVIDER_SITE_OTHER): Payer: Medicare Other | Admitting: Endocrinology

## 2017-04-02 ENCOUNTER — Encounter: Payer: Self-pay | Admitting: Endocrinology

## 2017-04-02 VITALS — BP 132/86 | HR 74 | Ht 67.0 in | Wt 288.0 lb

## 2017-04-02 DIAGNOSIS — E118 Type 2 diabetes mellitus with unspecified complications: Secondary | ICD-10-CM

## 2017-04-02 LAB — HEMOGLOBIN A1C: Hgb A1c MFr Bld: 6.7 % — ABNORMAL HIGH (ref 4.6–6.5)

## 2017-04-02 NOTE — Patient Instructions (Addendum)
A diabetes blood test is requested for you today.  We'll let you know about the results.   Please come back for a follow-up appointment in 6 months.   check your blood sugar once a day.  vary the time of day when you check, between before the 3 meals, and at bedtime.  also check if you have symptoms of your blood sugar being too high or too low.  please keep a record of the readings and bring it to your next appointment here.  You can write it on any piece of paper.  please call us sooner if your blood sugar goes below 70, or if you have a lot of readings over 200.

## 2017-04-02 NOTE — Progress Notes (Signed)
Subjective:    Patient ID: [name], female    DOB: May 11, 1960, 57 y.o.   MRN: [id number]  HPI Pt returns for f/u of diabetes mellitus: DM type: 2 Dx'ed: 2376 Complications: polyneuropathy and renal insuff. Therapy: 2 oral meds GDM: never. DKA: never. Severe hypoglycemia: never.  Pancreatitis: never.  Other: she took insulin from 2013-2015; She intermittently takes prednisone, for sarcoidosis; she cannot undergo weight-loss surgery, as she has medicaid.   Interval history: She is now off prednisone. pt states she feels well in general, except for chronic sob, which is unchanged.  She says cbg's are in the mid-100's.  Past Medical History:  Diagnosis Date  . Allergic rhinitis   . ALLERGIC RHINITIS 10/26/2009  . Anxiety   . ANXIETY 08/14/2006  . B12 DEFICIENCY 08/25/2007  . Confusion   . Depression    takes Lexapro daily  . Diabetes mellitus without complication ([place])    was on insulin but has been off since Nov 2015 and now only takes Metformin daily  . DYSPNEA 04/28/2009   with exertion  . Esophageal reflux    takes Nexium daily  . Fibromyalgia   . Headache    last migraine 2-7yrs ago;takes Topamax daily  . History of shingles   . Hypertension    takes Coreg daily  . Insomnia    takes Nortriptyline nightly   . Joint pain   . Joint swelling   . Left knee pain   . Lichen planus   . Long-term memory loss   . Nocturia   . OSA (obstructive sleep apnea)    doesn't use CPAP;sleep study in epic from 2006  . Osteoarthritis   . Osteoarthritis   . Pneumonia    over 30 yrs ago  . Protein calorie malnutrition ([place])   . Rheumatoid arthritis ([place])   . Sarcoidosis    Dr. Lolita Patella  . Short-term memory loss   . Shortness of breath   . TIA (transient ischemic attack)   . Unsteady gait   . Urinary urgency   . Vitamin D deficiency    is supposed to take Vit D but can't afford it  . VITAMIN D DEFICIENCY 08/25/2007    Past Surgical History:  Procedure  Laterality Date  . APPENDECTOMY    . arthroscopic knee surgery Right 11-12-04  . AXILLARY ABCESS IRRIGATION AND DEBRIDEMENT  Jul & Aug2012  . CARPAL TUNNEL RELEASE Left 05/23/2014   Procedure: CARPAL TUNNEL RELEASE;  Surgeon: Meredith Pel, MD;  Location: [place];  Service: Orthopedics;  Laterality: Left;  . cyst removed from top of buttocks  at age 73  . ENDOMETRIAL ABLATION    . TOTAL KNEE ARTHROPLASTY Right 11/15/2014   Procedure: TOTAL RIGHT KNEE ARTHROPLASTY;  Surgeon: Meredith Pel, MD;  Location: [place];  Service: Orthopedics;  Laterality: Right;  . TOTAL KNEE ARTHROPLASTY Left 07/13/2015   Procedure: LEFT TOTAL KNEE ARTHROPLASTY;  Surgeon: Meredith Pel, MD;  Location: [place];  Service: Orthopedics;  Laterality: Left;    Social History   Socioeconomic History  . Marital status: Single    Spouse name: Not on file  . Number of children: Not on file  . Years of education: Not on file  . Highest education level: Not on file  Occupational History  . Occupation: disabled  Social Needs  . Financial resource strain: Not on file  . Food insecurity:    Worry: Not on file    Inability: Not on file  .  Transportation needs:    Medical: Not on file    Non-medical: Not on file  Tobacco Use  . Smoking status: Former Smoker    Packs/day: 0.50    Years: 10.00    Pack years: 5.00    Types: Cigarettes  . Smokeless tobacco: Never Used  . Tobacco comment: quit smoking in 2004  Substance and Sexual Activity  . Alcohol use: No  . Drug use: No  . Sexual activity: Not Currently  Lifestyle  . Physical activity:    Days per week: Not on file    Minutes per session: Not on file  . Stress: Not on file  Relationships  . Social connections:    Talks on phone: Not on file    Gets together: Not on file    Attends religious service: Not on file    Active member of club or organization: Not on file    Attends meetings of clubs or organizations: Not on file    Relationship status:  Not on file  . Intimate partner violence:    Fear of current or ex partner: Not on file    Emotionally abused: Not on file    Physically abused: Not on file    Forced sexual activity: Not on file  Other Topics Concern  . Not on file  Social History Narrative   Single, broke up with partner in 2008.    Current Outpatient Medications on File Prior to Visit  Medication Sig Dispense Refill  . Adalimumab (HUMIRA) 40 MG/0.8ML PSKT Inject 40 mg into the skin every 14 (fourteen) days.    . carvedilol (COREG) 12.5 MG tablet TAKE 1 TABLET BY MOUTH TWICE DAILY WITH A MEAL 180 tablet 1  . chlorhexidine (PERIDEX) 0.12 % solution Use as directed 5 mLs in the mouth or throat 2 (two) times daily. Pt is to swish and spit. 120 mL 0  . clobetasol ointment (TEMOVATE) 0.05 % Apply topically. Apply topically two times day    . esomeprazole (NEXIUM) 40 MG capsule Take 1 capsule (40 mg total) by mouth daily before breakfast. 90 capsule 3  . furosemide (LASIX) 40 MG tablet TAKE 1 TABLET BY MOUTH EVERY MORNING FOR EDEMA 90 tablet 1  . gabapentin (NEURONTIN) 300 MG capsule Take 1 capsule (300 mg total) by mouth 2 (two) times daily as needed (for pain). 180 capsule 1  . metFORMIN (GLUCOPHAGE-XR) 500 MG 24 hr tablet TAKE 2 TABLETS(1000 MG) BY MOUTH DAILY WITH BREAKFAST 180 tablet 0  . potassium chloride (KLOR-CON 10) 10 MEQ tablet Take 1 tablet (10 mEq total) by mouth daily. 90 tablet 3  . sitaGLIPtin (JANUVIA) 50 MG tablet Take 1 tablet (50 mg total) by mouth daily. 90 tablet 3  . SYMBICORT 160-4.5 MCG/ACT inhaler Inhale 2 puffs into the lungs TWICE DAILY 120/4=30 10.2 g 5  . topiramate (TOPAMAX) 50 MG tablet Take 1 tablet (50 mg total) by mouth 2 (two) times daily. 180 tablet 1  . venlafaxine XR (EFFEXOR-XR) 75 MG 24 hr capsule Take 1 capsule (75 mg total) by mouth daily with breakfast. 30 capsule 11  . VENTOLIN HFA 108 (90 Base) MCG/ACT inhaler Inhale 2 puffs into the lungs every 6 (six) hours as needed for  wheezing or shortness of breath. 200/8=25 18 g 11  . VOLTAREN 1 % GEL Apply 2 g topically 2 (two) times daily as needed (for pain). 100/4=25 100 g 2  . XARELTO 20 MG TABS tablet Take 1 tablet (20 mg total) by  mouth daily. with food 90 tablet 0  . clonazePAM (KLONOPIN) 0.5 MG tablet Take 1-2 tablets BY MOUTH at bedtime (Patient not taking: Reported on 04/02/2017) 60 tablet 2  . HYDROcodone-acetaminophen (NORCO) 7.5-325 MG tablet Take 1 tablet by mouth every 4 (four) hours as needed for moderate pain or severe pain. (Patient not taking: Reported on 04/02/2017) 30 tablet 0  . [DISCONTINUED] potassium chloride (K-DUR,KLOR-CON) 10 MEQ tablet TAKE 1 TABLET BY MOUTH DAILY 90 tablet 2   No current facility-administered medications on file prior to visit.     Allergies  Allergen Reactions  . Belsomra [Suvorexant]     falling out of bed in the sleep: "I'm waking up as I'm falling on the floor"   . Enalapril Maleate Cough  . Hydrochlorothiazide Other (See Comments)    Reaction:  Low potassium levels   . Hydroxychloroquine Sulfate Other (See Comments)    Reaction:  Vision changes   . Iodine   . Latex Hives  . Nickel Other (See Comments)    Reaction:  Blisters     Family History  Problem Relation Age of Onset  . Heart disease Mother   . Kidney disease Mother   . Cancer Father        leukemia  . Hypertension Unknown   . Coronary artery disease Unknown        female 1st degree relative <60  . Heart failure Unknown        congestive  . Coronary artery disease Unknown        Female 1st degree relative <50  . Breast cancer Unknown        1st degree relative <50 S  . Breast cancer Sister     BP 132/86   Pulse 74   Ht 5\' 7"  (1.702 m)   Wt 288 lb (130.6 kg)   SpO2 97% Comment: On 2 L of 02  BMI 45.11 kg/m    Review of Systems She denies hypoglycemia    Objective:   Physical Exam VITAL SIGNS:  See vs page.  GENERAL: no distress.  In wheelchair.  Has 02 on.   Pulses: foot pulses are  intact bilaterally.    MSK: no deformity of the feet or ankles  CV: 2+ bilat leg edema. Skin:  no ulcer on the feet or ankles.  Normal temp on the feet and ankles.   Neuro: sensation is intact to touch on the feet and ankles, but slightly decreased from normal.    Lab Results  Component Value Date   HGBA1C 6.7 (H) 04/02/2017   Lab Results  Component Value Date   CREATININE 1.14 (H) 12/08/2016   BUN 13 12/08/2016   NA 133 (L) 12/08/2016   K 3.3 (L) 12/08/2016   CL 101 12/08/2016   CO2 21 (L) 12/08/2016      Assessment & Plan:  Type 2 DM, with polyneuropathy: well-controlled Renal insuff: this limits rx options.   Patient Instructions  A diabetes blood test is requested for you today.  We'll let you know about the results.   Please come back for a follow-up appointment in 6 months.   check your blood sugar once a day.  vary the time of day when you check, between before the 3 meals, and at bedtime.  also check if you have symptoms of your blood sugar being too high or too low.  please keep a record of the readings and bring it to your next appointment here.  You can write  it on any piece of paper.  please call us sooner if your blood sugar goes below 70, or if you have a lot of readings over 200.

## 2017-04-03 ENCOUNTER — Telehealth (HOSPITAL_COMMUNITY): Payer: Self-pay | Admitting: Internal Medicine

## 2017-04-03 ENCOUNTER — Encounter (HOSPITAL_COMMUNITY): Payer: Medicare Other

## 2017-04-03 NOTE — Progress Notes (Signed)
Daily Session Note  Patient Details  Name: [name] MRN: [id number] Date of Birth: 07-27-1960 Referring Provider:     Pulmonary Rehab Walk Test from 03/18/2017 in [place]  Referring Provider  Dr. Elsworth Soho      Encounter Date: 04/01/2017  Check In:   Capillary Blood Glucose: No results found for this or any previous visit (from the past 24 hour(s)). POCT Glucose - 04/03/17 1417      POCT Blood Glucose   Pre-Exercise  140 mg/dL    Post-Exercise  127 mg/dL        Social History   Tobacco Use  Smoking Status Former Smoker  . Packs/day: 0.50  . Years: 10.00  . Pack years: 5.00  . Types: Cigarettes  Smokeless Tobacco Never Used  Tobacco Comment   quit smoking in 2004    Goals Met:  Exercise tolerated well Strength training completed today  Goals Unmet:  Not Applicable  Comments: Service time is from 1330 to 1515.    Dr. Rush Farmer is Medical Director for Pulmonary Rehab at [facility].

## 2017-04-08 ENCOUNTER — Encounter (HOSPITAL_COMMUNITY)
Admission: RE | Admit: 2017-04-08 | Discharge: 2017-04-08 | Disposition: A | Discharge status: Home / Self Care | Payer: Medicare Other | Source: Ambulatory Visit | Attending: Pulmonary Disease | Admitting: Pulmonary Disease

## 2017-04-08 DIAGNOSIS — J449 Chronic obstructive pulmonary disease, unspecified: Secondary | ICD-10-CM | POA: Diagnosis not present

## 2017-04-08 DIAGNOSIS — D869 Sarcoidosis, unspecified: Secondary | ICD-10-CM | POA: Diagnosis not present

## 2017-04-08 NOTE — Progress Notes (Signed)
Daily Session Note  Patient Details  Name: [name] MRN: [id number] Date of Birth: 12/06/1960 Referring Provider:     Pulmonary Rehab Walk Test from 03/18/2017 in [place]  Referring Provider  Dr. Elsworth Soho      Encounter Date: 04/08/2017  Check In: Session Check In - 04/08/17 1331      Check-In   Location  [facility]    Staff Present  Su Hilt, MS, ACSM RCEP, Exercise Physiologist;Portia Rollene Rotunda, RN, BSN    Supervising physician immediately available to respond to emergencies  [facility] immediately available    Physician(s)  Dr. Lonny Prude    Medication changes reported      No    Fall or balance concerns reported     No    Tobacco Cessation  No Change    Warm-up and Cool-down  Performed as group-led instruction    Resistance Training Performed  Yes    VAD Patient?  No      Pain Assessment   Currently in Pain?  No/denies    Multiple Pain Sites  No       Capillary Blood Glucose: No results found for this or any previous visit (from the past 24 hour(s)).    Social History   Tobacco Use  Smoking Status Former Smoker  . Packs/day: 0.50  . Years: 10.00  . Pack years: 5.00  . Types: Cigarettes  Smokeless Tobacco Never Used  Tobacco Comment   quit smoking in 2004    Goals Met:  Exercise tolerated well No report of cardiac concerns or symptoms Strength training completed today  Goals Unmet:  Not Applicable  Comments: Service time is from 1330 to 1515    Dr. Rush Farmer is Medical Director for Pulmonary Rehab at [facility].

## 2017-04-09 ENCOUNTER — Ambulatory Visit: Payer: Medicare Other | Admitting: Internal Medicine

## 2017-04-10 ENCOUNTER — Other Ambulatory Visit: Payer: Self-pay | Admitting: Pharmacist

## 2017-04-10 ENCOUNTER — Encounter (HOSPITAL_COMMUNITY)
Admission: RE | Admit: 2017-04-10 | Discharge: 2017-04-10 | Disposition: A | Discharge status: Home / Self Care | Payer: Medicare Other | Source: Ambulatory Visit | Attending: Pulmonary Disease | Admitting: Pulmonary Disease

## 2017-04-10 DIAGNOSIS — D869 Sarcoidosis, unspecified: Secondary | ICD-10-CM

## 2017-04-10 DIAGNOSIS — J449 Chronic obstructive pulmonary disease, unspecified: Secondary | ICD-10-CM | POA: Diagnosis not present

## 2017-04-10 NOTE — Progress Notes (Signed)
Incomplete Session Note  Patient Details  Name: [name] MRN: [id number] Date of Birth: 24-Oct-1960 Referring Provider:     Pulmonary Rehab Walk Test from 03/18/2017 in [place]  Referring Provider  Dr. Kirt Boys did not complete her rehab session.  [name] states that "I have no energy today". After further conversation it is discovered that she has not eaten at all. She states she had a cup of coffee. [name] verbalizes financial instability and lack of monies to obtain food. She was given resources and food from the pulmonary food pantry on Tuesday. She states "this month has been tough with credit card bills". Patient feels she can better manage her money next month. Will follow closely and access additional resources as needed. She was not allowed to exercise.

## 2017-04-10 NOTE — Patient Outreach (Addendum)
[facility] [place] [facility]) Care Management  04/10/2017  [place] 07-11-1960 [id number]   Called patient to follow up after her provider appointment. HIPAA identifiers were obtained.  Patient said there was some confusion with her appointment with Dr. Alain Marion and she did not have an appointment.   Patient said she will call and reschedule her appointment. She said she has been going to Pulmonary Rehab twice a week and she has been tired.  Venlafaxine XR is no longer covered by the patient's insurance. A note was sent to Dr. Judeen Hammans office 03/26/17 to inquire about switching to the tablets.   Plan: Follow up with patient in 2 weeks to check on her medications after her visit. Close patient's case.  Elayne Guerin, PharmD, [place] Clinical Pharmacist [phone]

## 2017-04-11 NOTE — Progress Notes (Signed)
[name] 57 y.o. female   DOB: 1960-02-06 MRN: [id number]          Nutrition 1. Sarcoidosis    Past Medical History:  Diagnosis Date  . Allergic rhinitis   . ALLERGIC RHINITIS 10/26/2009  . Anxiety   . ANXIETY 08/14/2006  . B12 DEFICIENCY 08/25/2007  . Confusion   . Depression    takes Lexapro daily  . Diabetes mellitus without complication ([place])    was on insulin but has been off since Nov 2015 and now only takes Metformin daily  . DYSPNEA 04/28/2009   with exertion  . Esophageal reflux    takes Nexium daily  . Fibromyalgia   . Headache    last migraine 2-85yrs ago;takes Topamax daily  . History of shingles   . Hypertension    takes Coreg daily  . Insomnia    takes Nortriptyline nightly   . Joint pain   . Joint swelling   . Left knee pain   . Lichen planus   . Long-term memory loss   . Nocturia   . OSA (obstructive sleep apnea)    doesn't use CPAP;sleep study in epic from 2006  . Osteoarthritis   . Osteoarthritis   . Pneumonia    over 30 yrs ago  . Protein calorie malnutrition ([place])   . Rheumatoid arthritis ([place])   . Sarcoidosis    Dr. Lolita Patella  . Short-term memory loss   . Shortness of breath   . TIA (transient ischemic attack)   . Unsteady gait   . Urinary urgency   . Vitamin D deficiency    is supposed to take Vit D but can't afford it  . VITAMIN D DEFICIENCY 08/25/2007   Meds reviewed. Metformin, Januvia noted  Ht: Ht Readings from Last 1 Encounters:  04/02/17 5\' 7"  (1.702 m)     Wt:  Wt Readings from Last 3 Encounters:  04/02/17 288 lb (130.6 kg)  04/01/17 271 lb 2.7 oz (123 kg)  03/31/17 289 lb 6.4 oz (131.3 kg)     BMI: 45.1    Current tobacco use? No  Labs:  Lab Results  Component Value Date   HGBA1C 6.7 (H) 04/02/2017    Nutrition Diagnosis ? Food-and nutrition-related knowledge deficit related to lack of exposure to information as related to diagnosis of pulmonary disease ? Obesity related to excessive energy intake as  evidenced by a BMI of 45.1  Goal(s) 1. Identify food quantities necessary to achieve wt loss of  -2# per week to a goal wt loss of 6-24 lb at graduation from pulmonary rehab.  Plan:  Pt to attend Pulmonary Nutrition class Will provide client-centered nutrition education as part of interdisciplinary care.   Monitor and evaluate progress toward nutrition goal with team.  Monitor and Evaluate progress toward nutrition goal with team.   Derek Mound, M.Ed, RD, LDN, CDE 04/11/2017 12:24 PM

## 2017-04-15 ENCOUNTER — Encounter (HOSPITAL_COMMUNITY)
Admission: RE | Admit: 2017-04-15 | Discharge: 2017-04-15 | Disposition: A | Discharge status: Home / Self Care | Payer: Medicare Other | Source: Ambulatory Visit | Attending: Pulmonary Disease | Admitting: Pulmonary Disease

## 2017-04-15 VITALS — Wt 287.0 lb

## 2017-04-15 DIAGNOSIS — D869 Sarcoidosis, unspecified: Secondary | ICD-10-CM

## 2017-04-15 DIAGNOSIS — J449 Chronic obstructive pulmonary disease, unspecified: Secondary | ICD-10-CM | POA: Diagnosis not present

## 2017-04-15 NOTE — Progress Notes (Signed)
Daily Session Note  Patient Details  Name: [name] MRN: [id number] Date of Birth: March 30, 1960 Referring Provider:     Pulmonary Rehab Walk Test from 03/18/2017 in [place]  Referring Provider  Dr. Elsworth Soho      Encounter Date: 04/15/2017  Check In: Session Check In - 04/15/17 1524      Check-In   Location  [facility]    Staff Present  Trish Fountain, RN, BSN;Molly diVincenzo, MS, ACSM RCEP, Exercise Physiologist;Clarita Mcelvain Ysidro Evert, RN    Supervising physician immediately available to respond to emergencies  [facility] immediately available    Physician(s)  Dr. Posey Pronto    Medication changes reported      No    Fall or balance concerns reported     No    Tobacco Cessation  No Change    Warm-up and Cool-down  Performed as group-led instruction    Resistance Training Performed  Yes      Pain Assessment   Currently in Pain?  No/denies    Multiple Pain Sites  No       Capillary Blood Glucose: No results found for this or any previous visit (from the past 24 hour(s)). POCT Glucose - 04/15/17 1540      POCT Blood Glucose   Pre-Exercise  119 mg/dL    Post-Exercise  111 mg/dL      Exercise Prescription Changes - 04/15/17 1500      Response to Exercise   Blood Pressure (Admit)  124/72    Blood Pressure (Exercise)  130/70    Blood Pressure (Exit)  120/70    Heart Rate (Admit)  73 bpm    Heart Rate (Exercise)  92 bpm    Heart Rate (Exit)  76 bpm    Oxygen Saturation (Admit)  95 %    Oxygen Saturation (Exercise)  91 %    Oxygen Saturation (Exit)  97 %    Rating of Perceived Exertion (Exercise)  15    Perceived Dyspnea (Exercise)  1    Duration  Progress to 45 minutes of aerobic exercise without signs/symptoms of physical distress    Intensity  THRR unchanged      Progression   Progression  Continue to progress workloads to maintain intensity without signs/symptoms of physical distress.      Resistance Training   Training Prescription  Yes    Weight  orange bands    Reps  10-15    Time  10 Minutes      Oxygen   Oxygen  Continuous    Liters  2      Bike   Level  2    Minutes  17      NuStep   Level  3    Minutes  34    METs  1.8       Social History   Tobacco Use  Smoking Status Former Smoker  . Packs/day: 0.50  . Years: 10.00  . Pack years: 5.00  . Types: Cigarettes  Smokeless Tobacco Never Used  Tobacco Comment   quit smoking in 2004    Goals Met:  Exercise tolerated well No report of cardiac concerns or symptoms Strength training completed today  Goals Unmet:  Not Applicable  Comments: Service time is from 1330 to 1515    Dr. Rush Farmer is Medical Director for Pulmonary Rehab at [facility].

## 2017-04-16 ENCOUNTER — Telehealth: Payer: Self-pay

## 2017-04-16 NOTE — Telephone Encounter (Signed)
PA started on CoverMyMeds KEY: [id number]

## 2017-04-17 ENCOUNTER — Telehealth (HOSPITAL_COMMUNITY): Payer: Self-pay

## 2017-04-17 ENCOUNTER — Other Ambulatory Visit: Payer: Self-pay | Admitting: Internal Medicine

## 2017-04-17 ENCOUNTER — Encounter (HOSPITAL_COMMUNITY): Payer: Medicare Other

## 2017-04-17 NOTE — Telephone Encounter (Signed)
PA approved 01/16/2017 - 04/16/2018,

## 2017-04-17 NOTE — Telephone Encounter (Signed)
Patient called and stated she will not be attending class today due to pink eye.

## 2017-04-22 ENCOUNTER — Encounter (HOSPITAL_COMMUNITY)
Admission: RE | Admit: 2017-04-22 | Discharge: 2017-04-22 | Disposition: A | Discharge status: Home / Self Care | Payer: Medicare Other | Source: Ambulatory Visit

## 2017-04-22 DIAGNOSIS — D869 Sarcoidosis, unspecified: Secondary | ICD-10-CM

## 2017-04-22 NOTE — Progress Notes (Signed)
Pulmonary Individual Treatment Plan  Patient Details  Name: [name] MRN: [id number] Date of Birth: 1960/06/14 Referring Provider:     Pulmonary Rehab Walk Test from 03/18/2017 in [facility]  Referring Provider  Dr. Elsworth Soho      Initial Encounter Date:    Pulmonary Rehab Walk Test from 03/18/2017 in [facility]  Date  03/18/17  Referring Provider  Dr. Elsworth Soho      Visit Diagnosis: Sarcoidosis  Patient's Home Medications on Admission:   Current Outpatient Medications:  .  Adalimumab (HUMIRA) 40 MG/0.8ML PSKT, Inject 40 mg into the skin every 14 (fourteen) days., Disp: , Rfl:  .  carvedilol (COREG) 12.5 MG tablet, TAKE 1 TABLET BY MOUTH TWICE DAILY WITH A MEAL, Disp: 180 tablet, Rfl: 1 .  chlorhexidine (PERIDEX) 0.12 % solution, Use as directed 5 mLs in the mouth or throat 2 (two) times daily. Pt is to swish and spit., Disp: 120 mL, Rfl: 0 .  clobetasol ointment (TEMOVATE) 0.05 %, Apply topically. Apply topically two times day, Disp: , Rfl:  .  clonazePAM (KLONOPIN) 0.5 MG tablet, Take 1-2 tablets BY MOUTH at bedtime (Patient not taking: Reported on 04/02/2017), Disp: 60 tablet, Rfl: 2 .  esomeprazole (NEXIUM) 40 MG capsule, Take 1 capsule (40 mg total) by mouth daily before breakfast., Disp: 90 capsule, Rfl: 3 .  furosemide (LASIX) 40 MG tablet, TAKE 1 TABLET BY MOUTH EVERY MORNING FOR EDEMA, Disp: 90 tablet, Rfl: 1 .  gabapentin (NEURONTIN) 300 MG capsule, Take 1 capsule (300 mg total) by mouth 2 (two) times daily as needed (for pain)., Disp: 180 capsule, Rfl: 1 .  HYDROcodone-acetaminophen (NORCO) 7.5-325 MG tablet, Take 1 tablet by mouth every 4 (four) hours as needed for moderate pain or severe pain. (Patient not taking: Reported on 04/02/2017), Disp: 30 tablet, Rfl: 0 .  metFORMIN (GLUCOPHAGE-XR) 500 MG 24 hr tablet, TAKE 2 TABLETS(1000 MG) BY MOUTH DAILY WITH BREAKFAST, Disp: 180 tablet, Rfl: 0 .  potassium chloride  (KLOR-CON 10) 10 MEQ tablet, Take 1 tablet (10 mEq total) by mouth daily., Disp: 90 tablet, Rfl: 3 .  sitaGLIPtin (JANUVIA) 50 MG tablet, Take 1 tablet (50 mg total) by mouth daily., Disp: 90 tablet, Rfl: 3 .  SYMBICORT 160-4.5 MCG/ACT inhaler, Inhale 2 puffs into the lungs TWICE DAILY 120/4=30, Disp: 10.2 g, Rfl: 5 .  topiramate (TOPAMAX) 50 MG tablet, Take 1 tablet (50 mg total) by mouth 2 (two) times daily., Disp: 180 tablet, Rfl: 1 .  venlafaxine XR (EFFEXOR-XR) 75 MG 24 hr capsule, Take 1 capsule (75 mg total) by mouth daily with breakfast., Disp: 30 capsule, Rfl: 11 .  VENTOLIN HFA 108 (90 Base) MCG/ACT inhaler, Inhale 2 puffs into the lungs every 6 (six) hours as needed for wheezing or shortness of breath. 200/8=25, Disp: 18 g, Rfl: 11 .  VOLTAREN 1 % GEL, Apply 2 g topically 2 (two) times daily as needed (for pain). 100/4=25, Disp: 100 g, Rfl: 2 .  XARELTO 20 MG TABS tablet, Take 1 tablet (20 mg total) by mouth daily. with food, Disp: 90 tablet, Rfl: 0  Past Medical History: Past Medical History:  Diagnosis Date  . Allergic rhinitis   . ALLERGIC RHINITIS 10/26/2009  . Anxiety   . ANXIETY 08/14/2006  . B12 DEFICIENCY 08/25/2007  . Confusion   . Depression    takes Lexapro daily  . Diabetes mellitus without complication (HCC)    was on insulin but  has been off since Nov 2015 and now only takes Metformin daily  . DYSPNEA 04/28/2009   with exertion  . Esophageal reflux    takes Nexium daily  . Fibromyalgia   . Headache    last migraine 2-62yr ago;takes Topamax daily  . History of shingles   . Hypertension    takes Coreg daily  . Insomnia    takes Nortriptyline nightly   . Joint pain   . Joint swelling   . Left knee pain   . Lichen planus   . Long-term memory loss   . Nocturia   . OSA (obstructive sleep apnea)    doesn't use CPAP;sleep study in epic from 2006  . Osteoarthritis   . Osteoarthritis   . Pneumonia    over 30 yrs ago  . Protein calorie malnutrition ([place]   .  Rheumatoid arthritis ([place]   . Sarcoidosis    Dr. ZLolita Patella . Short-term memory loss   . Shortness of breath   . TIA (transient ischemic attack)   . Unsteady gait   . Urinary urgency   . Vitamin D deficiency    is supposed to take Vit D but can't afford it  . VITAMIN D DEFICIENCY 08/25/2007    Tobacco Use: Social History   Tobacco Use  Smoking Status Former Smoker  . Packs/day: 0.50  . Years: 10.00  . Pack years: 5.00  . Types: Cigarettes  Smokeless Tobacco Never Used  Tobacco Comment   quit smoking in 2004    Labs: Recent Review Flowsheet Data    Labs for ITP Cardiac and Pulmonary Rehab Latest Ref Rng & Units 04/03/2015 07/04/2015 01/30/2016 07/22/2016 04/02/2017   Cholestrol 0 - 200 mg/dL - - - - -   LDLCALC 0 - 99 mg/dL - - - - -   HDL >39.00 mg/dL - - - - -   Trlycerides 0.0 - 149.0 mg/dL - - - - -   Hemoglobin A1c 4.6 - 6.5 % 6.0 5.8(H) 5.9 7.1(H) 6.7(H)      Capillary Blood Glucose: Lab Results  Component Value Date   GLUCAP 198 (H) 07/23/2016   GLUCAP 133 (H) 07/23/2016   GLUCAP 130 (H) 07/22/2016   GLUCAP 153 (H) 07/22/2016   GLUCAP 229 (H) 07/22/2016   POCT Glucose    Row Name 04/03/17 1417 04/15/17 1540           POCT Blood Glucose   Pre-Exercise  140 mg/dL  119 mg/dL      Post-Exercise  127 mg/dL  111 mg/dL         Pulmonary Assessment Scores: Pulmonary Assessment Scores    Row Name 03/18/17 1633 04/02/17 1220       ADL UCSD   ADL Phase  Entry  Entry    SOB Score total  -  78      CAT Score   CAT Score  -  32 Entry      mMRC Score   mMRC Score  2  -       Pulmonary Function Assessment: Pulmonary Function Assessment - 02/17/17 1511      Breath   Bilateral Breath Sounds  Clear    Shortness of Breath  Yes;Limiting activity       Exercise Target Goals:    Exercise Program Goal: Individual exercise prescription set using results from initial 6 min walk test and THRR while considering  patient's activity barriers and safety.     Exercise Prescription Goal: Initial exercise  prescription builds to 30-45 minutes a day of aerobic activity, 2-3 days per week.  Home exercise guidelines will be given to patient during program as part of exercise prescription that the participant will acknowledge.  Activity Barriers & Risk Stratification: Activity Barriers & Cardiac Risk Stratification - 02/17/17 1454      Activity Barriers & Cardiac Risk Stratification   Activity Barriers  Left Knee Replacement;Right Knee Replacement;Joint Problems;Deconditioning;History of Falls;Shortness of Breath;Assistive Device;Back Problems       6 Minute Walk: 6 Minute Walk    Row Name 03/18/17 1626         6 Minute Walk   Phase  Initial     Distance  770 feet     Walk Time  6 minutes     # of Rest Breaks  0     MPH  1.45     METS  2.15     RPE  13     Perceived Dyspnea   1     Symptoms  Yes (comment)     Comments  wheelchair--9/10 back pain     Resting HR  75 bpm     Resting BP  130/82     Resting Oxygen Saturation   95 %     Exercise Oxygen Saturation  during 6 min walk  85 %     Max Ex. HR  106 bpm     Max Ex. BP  140/90       Interval HR   1 Minute HR  92     2 Minute HR  99     3 Minute HR  99     4 Minute HR  102     5 Minute HR  100     6 Minute HR  106     2 Minute Post HR  91     Interval Heart Rate?  Yes       Interval Oxygen   Interval Oxygen?  Yes     Baseline Oxygen Saturation %  95 %     1 Minute Oxygen Saturation %  94 %     1 Minute Liters of Oxygen  2 L     2 Minute Oxygen Saturation %  88 %     2 Minute Liters of Oxygen  2 L     3 Minute Oxygen Saturation %  88 %     3 Minute Liters of Oxygen  2 L     4 Minute Oxygen Saturation %  85 %     4 Minute Liters of Oxygen  2 L     5 Minute Oxygen Saturation %  85 %     5 Minute Liters of Oxygen  4 L     6 Minute Oxygen Saturation %  86 %     6 Minute Liters of Oxygen  4 L     2 Minute Post Oxygen Saturation %  95 %     2 Minute Post Liters of  Oxygen  4 L        Oxygen Initial Assessment: Oxygen Initial Assessment - 03/18/17 1632      Initial 6 min Walk   Oxygen Used  Continuous    Liters per minute  2      Program Oxygen Prescription   Program Oxygen Prescription  Continuous    Liters per minute  -- 4-6    Comments  patient desaturated to 85% on 4 liters while  walking--will re-evaluate on first day of rehab       Oxygen Re-Evaluation: Oxygen Re-Evaluation    [place] Name 03/24/17 1142 04/18/17 1531           Program Oxygen Prescription   Program Oxygen Prescription  Continuous  Continuous;E-Tanks      Liters per minute  - 4-6   4      Comments  patient desaturated to 85% on 4 liters while walking--will re-evaluate on first day of rehab  -        Home Oxygen   Home Oxygen Device  Home Concentrator;E-Tanks  Home Concentrator;E-Tanks      Sleep Oxygen Prescription  Continuous  Continuous      Liters per minute  2  2      Home Exercise Oxygen Prescription  Continuous  Continuous      Liters per minute  2  4      Home at Rest Exercise Oxygen Prescription  Continuous  Continuous      Liters per minute  2  2      Compliance with Home Oxygen Use  -  Yes        Goals/Expected Outcomes   Short Term Goals  To learn and exhibit compliance with exercise, home and travel O2 prescription;To learn and understand importance of monitoring SPO2 with pulse oximeter and demonstrate accurate use of the pulse oximeter.;To learn and understand importance of maintaining oxygen saturations>88%;To learn and demonstrate proper pursed lip breathing techniques or other breathing techniques.;To learn and demonstrate proper use of respiratory medications  To learn and exhibit compliance with exercise, home and travel O2 prescription;To learn and understand importance of monitoring SPO2 with pulse oximeter and demonstrate accurate use of the pulse oximeter.;To learn and understand importance of maintaining oxygen saturations>88%;To learn and  demonstrate proper pursed lip breathing techniques or other breathing techniques.;To learn and demonstrate proper use of respiratory medications      Long  Term Goals  Exhibits compliance with exercise, home and travel O2 prescription;Verbalizes importance of monitoring SPO2 with pulse oximeter and return demonstration;Maintenance of O2 saturations>88%;Exhibits proper breathing techniques, such as pursed lip breathing or other method taught during program session;Compliance with respiratory medication;Demonstrates proper use of MDI's  Exhibits compliance with exercise, home and travel O2 prescription;Verbalizes importance of monitoring SPO2 with pulse oximeter and return demonstration;Maintenance of O2 saturations>88%;Exhibits proper breathing techniques, such as pursed lip breathing or other method taught during program session;Compliance with respiratory medication;Demonstrates proper use of MDI's      Goals/Expected Outcomes  exhibit compliance  exhibit compliance         Oxygen Discharge (Final Oxygen Re-Evaluation): Oxygen Re-Evaluation - 04/18/17 1531      Program Oxygen Prescription   Program Oxygen Prescription  Continuous;E-Tanks    Liters per minute  4      Home Oxygen   Home Oxygen Device  Home Concentrator;E-Tanks    Sleep Oxygen Prescription  Continuous    Liters per minute  2    Home Exercise Oxygen Prescription  Continuous    Liters per minute  4    Home at Rest Exercise Oxygen Prescription  Continuous    Liters per minute  2    Compliance with Home Oxygen Use  Yes      Goals/Expected Outcomes   Short Term Goals  To learn and exhibit compliance with exercise, home and travel O2 prescription;To learn and understand importance of monitoring SPO2 with pulse oximeter and demonstrate accurate use of the  pulse oximeter.;To learn and understand importance of maintaining oxygen saturations>88%;To learn and demonstrate proper pursed lip breathing techniques or other breathing  techniques.;To learn and demonstrate proper use of respiratory medications    Long  Term Goals  Exhibits compliance with exercise, home and travel O2 prescription;Verbalizes importance of monitoring SPO2 with pulse oximeter and return demonstration;Maintenance of O2 saturations>88%;Exhibits proper breathing techniques, such as pursed lip breathing or other method taught during program session;Compliance with respiratory medication;Demonstrates proper use of MDI's    Goals/Expected Outcomes  exhibit compliance       Initial Exercise Prescription: Initial Exercise Prescription - 03/18/17 1600      Date of Initial Exercise RX and Referring Provider   Date  03/18/17    Referring Provider  Dr. Elsworth Soho      Oxygen   Oxygen  Continuous    Liters  -- 4-6      NuStep   Level  2    SPM  80    Minutes  17    METs  1.5      Track   Laps  5    Minutes  17      Prescription Details   Frequency (times per week)  2    Duration  Progress to 45 minutes of aerobic exercise without signs/symptoms of physical distress      Intensity   THRR 40-80% of Max Heartrate  66-132    Ratings of Perceived Exertion  11-13    Perceived Dyspnea  0-4      Progression   Progression  Continue to progress workloads to maintain intensity without signs/symptoms of physical distress.      Resistance Training   Training Prescription  Yes    Weight  orange bands    Reps  10-15       Perform Capillary Blood Glucose checks as needed.  Exercise Prescription Changes: Exercise Prescription Changes    Row Name 03/25/17 1413 04/15/17 1500           Response to Exercise   Blood Pressure (Admit)  132/84  124/72      Blood Pressure (Exercise)  108/70  130/70      Blood Pressure (Exit)  114/64  120/70      Heart Rate (Admit)  83 bpm  73 bpm      Heart Rate (Exercise)  87 bpm  92 bpm      Heart Rate (Exit)  74 bpm  76 bpm      Oxygen Saturation (Admit)  96 %  95 %      Oxygen Saturation (Exercise)  92 %  91 %       Oxygen Saturation (Exit)  96 %  97 %      Rating of Perceived Exertion (Exercise)  15  15      Perceived Dyspnea (Exercise)  2  1      Duration  Progress to 45 minutes of aerobic exercise without signs/symptoms of physical distress  Progress to 45 minutes of aerobic exercise without signs/symptoms of physical distress      Intensity  Other (comment) 40-80% HRR  THRR unchanged        Progression   Progression  Continue to progress workloads to maintain intensity without signs/symptoms of physical distress.  Continue to progress workloads to maintain intensity without signs/symptoms of physical distress.        Resistance Training   Training Prescription  Yes  Yes      Weight  orange bands  orange bands      Reps  10-15  10-15      Time  10 Minutes  10 Minutes        Oxygen   Oxygen  Continuous  Continuous      Liters  2  2        Bike   Level  -  2      Minutes  -  17        NuStep   Level  3  3      SPM  80  -      Minutes  17  34      METs  1.5  1.8        Track   Laps  5  -      Minutes  17  -         Exercise Comments:   Exercise Goals and Review: Exercise Goals    Row Name 02/17/17 1454             Exercise Goals   Increase Physical Activity  Yes       Intervention  Provide advice, education, support and counseling about physical activity/exercise needs.       Expected Outcomes  Short Term: Attend rehab on a regular basis to increase amount of physical activity.       Increase Strength and Stamina  Yes       Intervention  Provide advice, education, support and counseling about physical activity/exercise needs.;Develop an individualized exercise prescription for aerobic and resistive training based on initial evaluation findings, risk stratification, comorbidities and participant's personal goals.       Expected Outcomes  Short Term: Increase workloads from initial exercise prescription for resistance, speed, and METs.       Able to understand and use rate  of perceived exertion (RPE) scale  Yes       Intervention  Provide education and explanation on how to use RPE scale       Expected Outcomes  Short Term: Able to use RPE daily in rehab to express subjective intensity level;Long Term:  Able to use RPE to guide intensity level when exercising independently       Able to understand and use Dyspnea scale  Yes       Intervention  Provide education and explanation on how to use Dyspnea scale       Expected Outcomes  Short Term: Able to use Dyspnea scale daily in rehab to express subjective sense of shortness of breath during exertion;Long Term: Able to use Dyspnea scale to guide intensity level when exercising independently       Knowledge and understanding of Target Heart Rate Range (THRR)  Yes       Intervention  Provide education and explanation of THRR including how the numbers were predicted and where they are located for reference       Expected Outcomes  Short Term: Able to state/look up THRR;Short Term: Able to use daily as guideline for intensity in rehab;Long Term: Able to use THRR to govern intensity when exercising independently       Understanding of Exercise Prescription  Yes       Intervention  Provide education, explanation, and written materials on patient's individual exercise prescription       Expected Outcomes  Short Term: Able to explain program exercise prescription;Long Term: Able to explain home exercise prescription to exercise independently  Exercise Goals Re-Evaluation : Exercise Goals Re-Evaluation    Row Name 03/24/17 1143 04/18/17 1532           Exercise Goal Re-Evaluation   Exercise Goals Review  -  Increase Physical Activity;Able to understand and use rate of perceived exertion (RPE) scale;Knowledge and understanding of Target Heart Rate Range (THRR);Understanding of Exercise Prescription;Increase Strength and Stamina;Able to understand and use Dyspnea scale      Comments  Patient will not start rehab until  03/25/17  Patient is progressing slowly. She is only able to complete 4-5 laps (244f) each in 15 minutes while pushing a wheelchair. MET average places her in a low functional level.       Expected Outcomes  -   Through exercise at rehab and at home, patient will increase strength and stamina. Patient will also gain the confidence and knowledge to adhere to a exercise regime at home.         Discharge Exercise Prescription (Final Exercise Prescription Changes): Exercise Prescription Changes - 04/15/17 1500      Response to Exercise   Blood Pressure (Admit)  124/72    Blood Pressure (Exercise)  130/70    Blood Pressure (Exit)  120/70    Heart Rate (Admit)  73 bpm    Heart Rate (Exercise)  92 bpm    Heart Rate (Exit)  76 bpm    Oxygen Saturation (Admit)  95 %    Oxygen Saturation (Exercise)  91 %    Oxygen Saturation (Exit)  97 %    Rating of Perceived Exertion (Exercise)  15    Perceived Dyspnea (Exercise)  1    Duration  Progress to 45 minutes of aerobic exercise without signs/symptoms of physical distress    Intensity  THRR unchanged      Progression   Progression  Continue to progress workloads to maintain intensity without signs/symptoms of physical distress.      Resistance Training   Training Prescription  Yes    Weight  orange bands    Reps  10-15    Time  10 Minutes      Oxygen   Oxygen  Continuous    Liters  2      Bike   Level  2    Minutes  17      NuStep   Level  3    Minutes  34    METs  1.8       Nutrition:  Target Goals: Understanding of nutrition guidelines, daily intake of sodium <15064m cholesterol <20094mcalories 30% from fat and 7% or less from saturated fats, daily to have 5 or more servings of fruits and vegetables.  Biometrics: Pre Biometrics - 02/17/17 1522      Pre Biometrics   Grip Strength  21 kg        Nutrition Therapy Plan and Nutrition Goals: Nutrition Therapy & Goals - 04/11/17 1227      Nutrition Therapy   Diet  Carb  Modified, Heart Healthy      Personal Nutrition Goals   Nutrition Goal  Identify food quantities necessary to achieve wt loss of  -2# per week to a goal wt loss of 6-24 lb at graduation from pulmonary rehab.      Intervention Plan   Intervention  Prescribe, educate and counsel regarding individualized specific dietary modifications aiming towards targeted core components such as weight, hypertension, lipid management, diabetes, heart failure and other comorbidities.    Expected Outcomes  Short  Term Goal: Understand basic principles of dietary content, such as calories, fat, sodium, cholesterol and nutrients.;Long Term Goal: Adherence to prescribed nutrition plan.       Nutrition Assessments: Nutrition Assessments - 04/11/17 1223      Rate Your Plate Scores   Pre Score  48       Nutrition Goals Re-Evaluation:   Nutrition Goals Discharge (Final Nutrition Goals Re-Evaluation):   Psychosocial: Target Goals: Acknowledge presence or absence of significant depression and/or stress, maximize coping skills, provide positive support system. Participant is able to verbalize types and ability to use techniques and skills needed for reducing stress and depression.  Initial Review & Psychosocial Screening: Initial Psych Review & Screening - 02/17/17 1512      Initial Review   Current issues with  Current Depression;History of Depression;Current Psychotropic Meds      Family Dynamics   Good Support System?  No    Concerns  Inappropriate over/under dependence on family/friends    Comments  4 siblings, 1 son but none attend to patients needs      Barriers   Psychosocial barriers to participate in program  There are no identifiable barriers or psychosocial needs.;The patient should benefit from training in stress management and relaxation.      Screening Interventions   Interventions  Encouraged to exercise       Quality of Life Scores:  Scores of 19 and below usually indicate a  poorer quality of life in these areas.  A difference of  2-3 points is a clinically meaningful difference.  A difference of 2-3 points in the total score of the Quality of Life Index has been associated with significant improvement in overall quality of life, self-image, physical symptoms, and general health in studies assessing change in quality of life.   PHQ-9: Recent Review Flowsheet Data    Depression screen [facility] 2/9 02/17/2017 01/15/2017 12/19/2016 11/06/2016 10/24/2016   Decreased Interest 1 1 3 1  0   Down, Depressed, Hopeless 1 0 2 1 0   PHQ - 2 Score 2 1 5 2  0   Altered sleeping 1 - 0 2 -   Tired, decreased energy 3 - 3 2 -   Change in appetite 1 - 1 1 -   Feeling bad or failure about yourself  0 - 0 0 -   Trouble concentrating 0 - 0 0 -   Moving slowly or fidgety/restless 0 - 0 0 -   Suicidal thoughts 0 - 0 0 -   PHQ-9 Score 7 - 9 7 -   Difficult doing work/chores - - - Not difficult at all -     Interpretation of Total Score  Total Score Depression Severity:  1-4 = Minimal depression, 5-9 = Mild depression, 10-14 = Moderate depression, 15-19 = Moderately severe depression, 20-27 = Severe depression   Psychosocial Evaluation and Intervention: Psychosocial Evaluation - 02/17/17 1515      Psychosocial Evaluation & Interventions   Interventions  Encouraged to exercise with the program and follow exercise prescription    Expected Outcomes  Patients psychosocial issues will not become barriers to participation     Continue Psychosocial Services   Follow up required by staff       Psychosocial Re-Evaluation: Psychosocial Re-Evaluation    [place] Name 04/22/17 0719             Psychosocial Re-Evaluation   Current issues with  Current Depression;History of Depression;Current Stress Concerns;Current Psychotropic Meds  Comments  recently patient verbalized lack of income to [employer]. she states she only recieves 22.00 in food stamps. she has had a lot of medical bill that  needed paying therefore she was unable to buy food toward the end of the month. food and resources provided department. will continue to follow closely       Expected Outcomes  patient will remain free from psychosocial barriers to participation to pulmonary rehab       Interventions  Encouraged to attend Pulmonary Rehabilitation for the exercise       Continue Psychosocial Services   Follow up required by staff          Psychosocial Discharge (Final Psychosocial Re-Evaluation): Psychosocial Re-Evaluation - 04/22/17 0719      Psychosocial Re-Evaluation   Current issues with  Current Depression;History of Depression;Current Stress Concerns;Current Psychotropic Meds    Comments  recently patient verbalized lack of income to [employer]. she states she only recieves 22.00 in food stamps. she has had a lot of medical bill that needed paying therefore she was unable to buy food toward the end of the month. food and resources provided department. will continue to follow closely    Expected Outcomes  patient will remain free from psychosocial barriers to participation to pulmonary rehab    Interventions  Encouraged to attend Pulmonary Rehabilitation for the exercise    Continue Psychosocial Services   Follow up required by staff       Education: Education Goals: Education classes will be provided on a weekly basis, covering required topics. Participant will state understanding/return demonstration of topics presented.  Learning Barriers/Preferences:   Education Topics: Risk Factor Reduction:  -Group instruction that is supported by a PowerPoint presentation. Instructor discusses the definition of a risk factor, different risk factors for pulmonary disease, and how the heart and lungs work together.     Nutrition for Pulmonary Patient:  -Group instruction provided by PowerPoint slides, verbal discussion, and written materials to support subject matter. The instructor gives an explanation and  review of healthy diet recommendations, which includes a discussion on weight management, recommendations for fruit and vegetable consumption, as well as protein, fluid, caffeine, fiber, sodium, sugar, and alcohol. Tips for eating when patients are short of breath are discussed.   Pursed Lip Breathing:  -Group instruction that is supported by demonstration and informational handouts. Instructor discusses the benefits of pursed lip and diaphragmatic breathing and detailed demonstration on how to preform both.     Oxygen Safety:  -Group instruction provided by PowerPoint, verbal discussion, and written material to support subject matter. There is an overview of "What is Oxygen" and "Why do we need it".  Instructor also reviews how to create a safe environment for oxygen use, the importance of using oxygen as prescribed, and the risks of noncompliance. There is a brief discussion on traveling with oxygen and resources the patient may utilize.   Oxygen Equipment:  -Group instruction provided by [facility] Staff utilizing handouts, written materials, and equipment demonstrations.   Signs and Symptoms:  -Group instruction provided by written material and verbal discussion to support subject matter. Warning signs and symptoms of infection, stroke, and heart attack are reviewed and when to call the physician/911 reinforced. Tips for preventing the spread of infection discussed.   Advanced Directives:  -Group instruction provided by verbal instruction and written material to support subject matter. Instructor reviews Advanced Directive laws and proper instruction for filling out document.   Pulmonary Video:  -  Group video education that reviews the importance of medication and oxygen compliance, exercise, good nutrition, pulmonary hygiene, and pursed lip and diaphragmatic breathing for the pulmonary patient.   Exercise for the Pulmonary Patient:  -Group instruction that is supported by a  PowerPoint presentation. Instructor discusses benefits of exercise, core components of exercise, frequency, duration, and intensity of an exercise routine, importance of utilizing pulse oximetry during exercise, safety while exercising, and options of places to exercise outside of rehab.     Pulmonary Medications:  -Verbally interactive group education provided by instructor with focus on inhaled medications and proper administration.   PULMONARY REHAB OTHER RESPIRATORY from 04/10/2017 in [place]  Date  04/10/17  Educator  pharmacist  Instruction Review Code  1- Verbalizes Understanding      Anatomy and Physiology of the Respiratory System and Intimacy:  -Group instruction provided by PowerPoint, verbal discussion, and written material to support subject matter. Instructor reviews respiratory cycle and anatomical components of the respiratory system and their functions. Instructor also reviews differences in obstructive and restrictive respiratory diseases with examples of each. Intimacy, Sex, and Sexuality differences are reviewed with a discussion on how relationships can change when diagnosed with pulmonary disease. Common sexual concerns are reviewed.   MD DAY -A group question and answer session with a medical doctor that allows participants to ask questions that relate to their pulmonary disease state.   OTHER EDUCATION -Group or individual verbal, written, or video instructions that support the educational goals of the pulmonary rehab program.   Holiday Eating Survival Tips:  -Group instruction provided by PowerPoint slides, verbal discussion, and written materials to support subject matter. The instructor gives patients tips, tricks, and techniques to help them not only survive but enjoy the holidays despite the onslaught of food that accompanies the holidays.   Knowledge Questionnaire Score: Knowledge Questionnaire Score - 04/02/17 1220       Knowledge Questionnaire Score   Pre Score  13/18       Core Components/Risk Factors/Patient Goals at Admission: Personal Goals and Risk Factors at Admission - 02/17/17 1511      Core Components/Risk Factors/Patient Goals on Admission    Weight Management  Obesity;Yes    Intervention  Obesity: Provide education and appropriate resources to help participant work on and attain dietary goals.;Weight Management/Obesity: Establish reasonable short term and long term weight goals.    Expected Outcomes  Short Term: Continue to assess and modify interventions until short term weight is achieved;Understanding of distribution of calorie intake throughout the day with the consumption of 4-5 meals/snacks;Understanding recommendations for meals to include 15-35% energy as protein, 25-35% energy from fat, 35-60% energy from carbohydrates, less than 227m of dietary cholesterol, 20-35 gm of total fiber daily;Weight Loss: Understanding of general recommendations for a balanced deficit meal plan, which promotes 1-2 lb weight loss per week and includes a negative energy balance of [phone] kcal/d    Improve shortness of breath with ADL's  Yes    Intervention  Provide education, individualized exercise plan and daily activity instruction to help decrease symptoms of SOB with activities of daily living.    Expected Outcomes  Short Term: Improve cardiorespiratory fitness to achieve a reduction of symptoms when performing ADLs;Long Term: Be able to perform more ADLs without symptoms or delay the onset of symptoms       Core Components/Risk Factors/Patient Goals Review:  Goals and Risk Factor Review    Row Name 04/22/17 [phone]  Core Components/Risk Factors/Patient Goals Review   Personal Goals Review  Weight Management/Obesity;Improve shortness of breath with ADL's;Increase knowledge of respiratory medications and ability to use respiratory devices properly.;Develop more efficient breathing techniques  such as purse lipped breathing and diaphragmatic breathing and practicing self-pacing with activity.       Review  patient is making slow progress towards pulmonary rehab goals. she missed last weeks sessions related to pink eye. she is tolerating workload increases however she has not began to loose weight. she has attended a pharmacy taught class on pulmonary medications and is beginning to use PLB technique during exertion.       Expected Outcomes  see admission expected outcomes          Core Components/Risk Factors/Patient Goals at Discharge (Final Review):  Goals and Risk Factor Review - 04/22/17 0717      Core Components/Risk Factors/Patient Goals Review   Personal Goals Review  Weight Management/Obesity;Improve shortness of breath with ADL's;Increase knowledge of respiratory medications and ability to use respiratory devices properly.;Develop more efficient breathing techniques such as purse lipped breathing and diaphragmatic breathing and practicing self-pacing with activity.    Review  patient is making slow progress towards pulmonary rehab goals. she missed last weeks sessions related to pink eye. she is tolerating workload increases however she has not began to loose weight. she has attended a pharmacy taught class on pulmonary medications and is beginning to use PLB technique during exertion.    Expected Outcomes  see admission expected outcomes       ITP Comments:   Comments: patient has attended 6 sessions since admission

## 2017-04-23 ENCOUNTER — Encounter: Payer: Self-pay | Admitting: Pharmacist

## 2017-04-23 ENCOUNTER — Other Ambulatory Visit: Payer: Self-pay | Admitting: Pharmacist

## 2017-04-23 NOTE — Patient Outreach (Signed)
[facility]) Care Management  04/23/2017  [place] 03-Jun-1960 [id number]   Patient was called regarding medication management and to follow up on venlafaxine. Unfortunately, the patient did not answer the phone. HIPAA compliant message was left on her voicemail.  Plan: Send patient an unsuccessful contact letter and call her back in 3 business days.  Elayne Guerin, PharmD, [place] Clinical Pharmacist [phone]

## 2017-04-24 ENCOUNTER — Encounter (HOSPITAL_COMMUNITY): Admission: RE | Admit: 2017-04-24 | Payer: Medicare Other | Source: Ambulatory Visit

## 2017-04-24 ENCOUNTER — Telehealth (HOSPITAL_COMMUNITY): Payer: Self-pay

## 2017-04-24 ENCOUNTER — Telehealth: Payer: Self-pay | Admitting: Internal Medicine

## 2017-04-24 NOTE — Telephone Encounter (Unsigned)
Copied from [place] [phone]. Topic: Quick Communication - See Telephone Encounter >> Apr 24, 2017  1:27 PM Hewitt Shorts wrote: CRM for notification. See Telephone encounter for: 04/24/17.

## 2017-04-24 NOTE — Telephone Encounter (Signed)
CRM for notification. See Telephone encounter for: 04/24/17.pt needs to talk with someone regarding getting a form completed and faxed to the gas office before they turn her gas off tomorrow  States that it concerns klonapin and potassium and two medicaitons she does not take now buspar? And amitripyiine -all have side effects of memory loss and that iis what has happened fro two months of trying to pay for the gas Please Att lilly brown [phone]  Best number [phone]

## 2017-04-25 ENCOUNTER — Ambulatory Visit: Payer: Medicare Other | Admitting: Family

## 2017-04-25 ENCOUNTER — Encounter: Payer: Self-pay | Admitting: Family

## 2017-04-25 ENCOUNTER — Ambulatory Visit (INDEPENDENT_AMBULATORY_CARE_PROVIDER_SITE_OTHER): Payer: Medicare Other | Admitting: Family

## 2017-04-25 VITALS — BP 142/82 | HR 78 | Temp 97.8°F | Ht 67.0 in | Wt 286.1 lb

## 2017-04-25 DIAGNOSIS — J019 Acute sinusitis, unspecified: Secondary | ICD-10-CM

## 2017-04-25 MED ORDER — DOXYCYCLINE HYCLATE 100 MG PO TABS: 100.0000 mg | ORAL_TABLET | Freq: Two times a day (BID) | ORAL | 0 refills | Status: DC

## 2017-04-25 NOTE — Telephone Encounter (Signed)
Letter written based off medical conditions per Dr. Alain Marion.

## 2017-04-25 NOTE — Progress Notes (Signed)
[name] is a 57 y.o. female with the following history as recorded in EpicCare:  Patient Active Problem List   Diagnosis Date Noted  . Left arm pain 03/10/2017  . COPD (chronic obstructive pulmonary disease) ([place]) 12/30/2016  . Syncope 12/17/2016  . Low back pain 12/17/2016  . Diastolic dysfunction 20/25/4270  . OSA (obstructive sleep apnea) 09/30/2016  . Chronic respiratory failure with hypoxia ([place]) 08/23/2016  . Pulmonary embolism ([place]) 07/21/2016  . DVT (deep venous thrombosis) ([place]) 07/21/2016  . Cough 07/11/2016  . Balance problems 06/14/2016  . Pain of right scapula 06/07/2016  . Acute bronchitis 01/30/2016  . Weight gain 12/20/2015  . Acute pulmonary embolism ([place]) 08/17/2015  . Diabetes mellitus type 2 with complications ([place]) 62/37/6283  . Postoperative anemia due to acute blood loss 02/01/2015  . Arthritis of knee, degenerative 11/15/2014  . Calf pain 01/21/2014  . Insomnia 12/23/2013  . Leg ulcer, left ([place]) 09/13/2013  . Abnormal SPEP 06/04/2013  . Memory loss 04/27/2013  . Panic attacks 03/17/2013  . Right knee pain 12/29/2012  . Rash and nonspecific skin eruption 10/27/2012  . Dizziness and giddiness 09/07/2012  . Pulmonary nodules 07/20/2012  . CAD in native artery 05/28/2012  . Seborrheic dermatitis 01/20/2012  . Recurrent knee pain 09/03/2011  . Knee pain, left 08/03/2011  . Leg pain, left 07/17/2011  . Polydipsia 05/09/2011  . Abscess of axilla, left 08/10/2010  . Obesity 03/07/2010  . INSOMNIA, CHRONIC 03/07/2010  . NAUSEA 03/07/2010  . Avulsion fracture of tooth 03/07/2010  . Allergic rhinitis 10/26/2009  . GRIEF REACTION 08/01/2009  . DYSPNEA 04/28/2009  . MAXILLARY SINUSITIS 10/05/2008  . Essential hypertension 08/27/2007  . B12 deficiency 08/25/2007  . Vitamin D deficiency 08/25/2007  . ELEVATED BP 07/10/2007  . VISUAL CHANGES 07/02/2007  . UTI 02/07/2007  . ABDOMINAL PAIN RIGHT LOWER QUADRANT 02/07/2007  . SINUSITIS, ACUTE  02/02/2007  . Osteoarthritis 02/02/2007  . Sarcoidosis 08/14/2006  . Anxiety state 08/14/2006  . Major depressive disorder, recurrent episode ([place]) 08/14/2006  . GERD 08/14/2006    Current Outpatient Medications  Medication Sig Dispense Refill  . Adalimumab (HUMIRA) 40 MG/0.8ML PSKT Inject 40 mg into the skin every 14 (fourteen) days.    . carvedilol (COREG) 12.5 MG tablet TAKE 1 TABLET BY MOUTH TWICE DAILY WITH A MEAL 180 tablet 1  . chlorhexidine (PERIDEX) 0.12 % solution Use as directed 5 mLs in the mouth or throat 2 (two) times daily. Pt is to swish and spit. 120 mL 0  . clobetasol ointment (TEMOVATE) 0.05 % Apply topically. Apply topically two times day    . clonazePAM (KLONOPIN) 0.5 MG tablet Take 1-2 tablets BY MOUTH at bedtime 60 tablet 2  . esomeprazole (NEXIUM) 40 MG capsule Take 1 capsule (40 mg total) by mouth daily before breakfast. 90 capsule 3  . furosemide (LASIX) 40 MG tablet TAKE 1 TABLET BY MOUTH EVERY MORNING FOR EDEMA 90 tablet 1  . gabapentin (NEURONTIN) 300 MG capsule Take 1 capsule (300 mg total) by mouth 2 (two) times daily as needed (for pain). 180 capsule 1  . Lancets (FREESTYLE) lancets 1 each.    . metFORMIN (GLUCOPHAGE-XR) 500 MG 24 hr tablet TAKE 2 TABLETS(1000 MG) BY MOUTH DAILY WITH BREAKFAST 180 tablet 0  . potassium chloride (KLOR-CON 10) 10 MEQ tablet Take 1 tablet (10 mEq total) by mouth daily. 90 tablet 3  . sitaGLIPtin (JANUVIA) 50 MG tablet Take 1 tablet (50 mg total) by mouth daily. 90 tablet  3  . SYMBICORT 160-4.5 MCG/ACT inhaler Inhale 2 puffs into the lungs TWICE DAILY 120/4=30 10.2 g 5  . topiramate (TOPAMAX) 50 MG tablet Take 1 tablet (50 mg total) by mouth 2 (two) times daily. 180 tablet 1  . venlafaxine XR (EFFEXOR-XR) 75 MG 24 hr capsule Take 1 capsule (75 mg total) by mouth daily with breakfast. 30 capsule 11  . VENTOLIN HFA 108 (90 Base) MCG/ACT inhaler Inhale 2 puffs into the lungs every 6 (six) hours as needed for wheezing or shortness of  breath. 200/8=25 18 g 11  . VOLTAREN 1 % GEL Apply 2 g topically 2 (two) times daily as needed (for pain). 100/4=25 100 g 2  . XARELTO 20 MG TABS tablet Take 1 tablet (20 mg total) by mouth daily. with food 90 tablet 0  . doxycycline (VIBRA-TABS) 100 MG tablet Take 1 tablet (100 mg total) by mouth 2 (two) times daily. 20 tablet 0   No current facility-administered medications for this visit.     Allergies: Belsomra [suvorexant]; Enalapril maleate; Hydrochlorothiazide; Hydroxychloroquine sulfate; Iodine; Latex; and Nickel  Past Medical History:  Diagnosis Date  . Allergic rhinitis   . ALLERGIC RHINITIS 10/26/2009  . Anxiety   . ANXIETY 08/14/2006  . B12 DEFICIENCY 08/25/2007  . Confusion   . Depression    takes Lexapro daily  . Diabetes mellitus without complication ([place])    was on insulin but has been off since Nov 2015 and now only takes Metformin daily  . DYSPNEA 04/28/2009   with exertion  . Esophageal reflux    takes Nexium daily  . Fibromyalgia   . Headache    last migraine 2-64yrs ago;takes Topamax daily  . History of shingles   . Hypertension    takes Coreg daily  . Insomnia    takes Nortriptyline nightly   . Joint pain   . Joint swelling   . Left knee pain   . Lichen planus   . Long-term memory loss   . Nocturia   . OSA (obstructive sleep apnea)    doesn't use CPAP;sleep study in epic from 2006  . Osteoarthritis   . Osteoarthritis   . Pneumonia    over 30 yrs ago  . Protein calorie malnutrition ([place])   . Rheumatoid arthritis ([place])   . Sarcoidosis    Dr. Lolita Patella  . Short-term memory loss   . Shortness of breath   . TIA (transient ischemic attack)   . Unsteady gait   . Urinary urgency   . Vitamin D deficiency    is supposed to take Vit D but can't afford it  . VITAMIN D DEFICIENCY 08/25/2007    Past Surgical History:  Procedure Laterality Date  . APPENDECTOMY    . arthroscopic knee surgery Right 11-12-04  . AXILLARY ABCESS IRRIGATION AND DEBRIDEMENT  Jul  & Aug2012  . CARPAL TUNNEL RELEASE Left 05/23/2014   Procedure: CARPAL TUNNEL RELEASE;  Surgeon: Meredith Pel, MD;  Location: [place];  Service: Orthopedics;  Laterality: Left;  . cyst removed from top of buttocks  at age 89  . ENDOMETRIAL ABLATION    . TOTAL KNEE ARTHROPLASTY Right 11/15/2014   Procedure: TOTAL RIGHT KNEE ARTHROPLASTY;  Surgeon: Meredith Pel, MD;  Location: [facility];  Service: Orthopedics;  Laterality: Right;  . TOTAL KNEE ARTHROPLASTY Left 07/13/2015   Procedure: LEFT TOTAL KNEE ARTHROPLASTY;  Surgeon: Meredith Pel, MD;  Location: [place];  Service: Orthopedics;  Laterality: Left;    Family  History  Problem Relation Age of Onset  . Heart disease Mother   . Kidney disease Mother   . Cancer Father        leukemia  . Hypertension Unknown   . Coronary artery disease Unknown        female 1st degree relative <60  . Heart failure Unknown        congestive  . Coronary artery disease Unknown        Female 1st degree relative <50  . Breast cancer Unknown        1st degree relative <50 S  . Breast cancer Sister     Social History   Tobacco Use  . Smoking status: Former Smoker    Packs/day: 0.50    Years: 10.00    Pack years: 5.00    Types: Cigarettes  . Smokeless tobacco: Never Used  . Tobacco comment: quit smoking in 2004  Substance Use Topics  . Alcohol use: No    Subjective:  Patient notes that earlier in the week- her eyes felt "sore" and were draining "milky white drainage." Denies any vision changes; + drainage in the back of her throat- "makes me gag." + weak- " I feel sick." Notes that her face hurt yesterday and there was pain trying to put her dentures in; + frontal headache; no change in breathing from baseline;     Objective:  Vitals:   04/25/17 1509  BP: (!) 142/82  Pulse: 78  Temp: 97.8 F (36.6 C)  TempSrc: Oral  SpO2: 98%  Weight: 286 lb 1.3 oz (129.8 kg)  Height: 5\' 7"  (1.702 m)    General: Well developed, well nourished, in no  acute distress  Skin : Warm and dry. Collection of fluid- blister type appearance noted under right eye; Head: Normocephalic and atraumatic  Eyes: Sclera and conjunctiva clear; pupils round and reactive to light; extraocular movements intact  Ears: External normal; canals clear; tympanic membranes normal  Oropharynx: Pink, supple. No suspicious lesions  Neck: Supple without thyromegaly, adenopathy  Lungs: Respirations unlabored; clear to auscultation bilaterally without wheeze, rales, rhonchi  CVS exam: normal rate and regular rhythm.  Neurologic: Alert and oriented; speech intact; face symmetrical; moves all extremities well; CNII-XII intact without focal deficit  Assessment:  1. Acute sinusitis, recurrence not specified, unspecified location     Plan:  Reassurance that do not think she has pink eye- suspect drainage in her eye is related to her sinuses; offered Flonase but she declines; Rx for Doxycycline 100 mg bid x 10 days; explained to patient that I do think she will be able to attend pulmonary rehab on Tuesday and encouraged her to keep that upcoming appointment; follow-up as needed otherwise.   No follow-ups on file.  No orders of the defined types were placed in this encounter.   Requested Prescriptions   Signed Prescriptions Disp Refills  . doxycycline (VIBRA-TABS) 100 MG tablet 20 tablet 0    Sig: Take 1 tablet (100 mg total) by mouth 2 (two) times daily.

## 2017-04-28 ENCOUNTER — Telehealth (HOSPITAL_COMMUNITY): Payer: Self-pay

## 2017-04-28 ENCOUNTER — Ambulatory Visit: Payer: Self-pay | Admitting: *Deleted

## 2017-04-28 ENCOUNTER — Other Ambulatory Visit: Payer: Self-pay | Admitting: Internal Medicine

## 2017-04-28 ENCOUNTER — Other Ambulatory Visit: Payer: Self-pay | Admitting: Pharmacist

## 2017-04-28 NOTE — Patient Outreach (Signed)
[facility]) Care Management  04/28/2017  [place] Sep 07, 1960 [id number]   Patient was called to follow up on Venlafaxine.  HIPAA identifiers were obtained.  Patient said she has been suffering with a stomach virus.  She was seen at her PCP office last week and was diagnosed with sinusitis and was started on Doxycyline.    Patient confirmed she was able to get her antibiotic and that she has been taking it as prescribed.  She also said she has not been eating a lot and feels weak.    Patient was encouraged to re-introduce food slowly by following the BRAT diet and to call her PCP office back is she does not begin to feel better over the next 24-48 hours.   Patient said she has enough Venlafaxine for now.  Plan: Call patient back in 30 days to check on venlafaxine--she received a letter stating the XL was no longer covered.  Elayne Guerin, PharmD, [place] Clinical Pharmacist [phone]

## 2017-04-28 NOTE — Telephone Encounter (Signed)
Patient called [phone] and left VM to return call to the office to discuss the symptoms of diarrhea and vomiting while on antibiotic. Patient called [phone], no answer, phone kept ringing, no VM set up.

## 2017-04-29 ENCOUNTER — Encounter (HOSPITAL_COMMUNITY): Payer: Medicare Other

## 2017-04-29 ENCOUNTER — Ambulatory Visit: Payer: Self-pay | Admitting: *Deleted

## 2017-04-29 NOTE — Progress Notes (Signed)
Daily Session Note  Patient Details  Name: [name] MRN: [id number] Date of Birth: [birth year] Referring Provider:     Pulmonary Rehab Walk Test from 03/18/2017 in [place]  Referring Provider  Dr. Elsworth Soho      Encounter Date: 04/15/2017  Check In:   Capillary Blood Glucose: No results found for this or any previous visit (from the past 24 hour(s)).  Exercise Prescription Changes - 04/29/17 1600      Response to Exercise   Blood Pressure (Admit)  124/72    Blood Pressure (Exercise)  130/72    Blood Pressure (Exit)  120/70    Heart Rate (Admit)  73 bpm    Heart Rate (Exercise)  92 bpm    Heart Rate (Exit)  76 bpm    Oxygen Saturation (Admit)  95 %    Oxygen Saturation (Exercise)  91 %    Oxygen Saturation (Exit)  97 %    Rating of Perceived Exertion (Exercise)  15    Perceived Dyspnea (Exercise)  1    Duration  Progress to 45 minutes of aerobic exercise without signs/symptoms of physical distress    Intensity  THRR unchanged      Progression   Progression  Continue to progress workloads to maintain intensity without signs/symptoms of physical distress.      Resistance Training   Training Prescription  Yes    Weight  orange bands    Reps  10-15    Time  10 Minutes      Oxygen   Oxygen  Continuous    Liters  2      Bike   Level  2    Minutes  17      NuStep   Level  5    SPM  80    Minutes  34    METs  2       Social History   Tobacco Use  Smoking Status Former Smoker  . Packs/day: 0.50  . Years: 10.00  . Pack years: 5.00  . Types: Cigarettes  Smokeless Tobacco Never Used  Tobacco Comment   quit smoking in 2004    Goals Met:  Exercise tolerated well No report of cardiac concerns or symptoms Strength training completed today  Goals Unmet:  Not Applicable  Comments: Service time is from 1:30P to 3:15P    Dr. Rush Farmer is Medical Director for Pulmonary Rehab at [facility].

## 2017-04-29 NOTE — Telephone Encounter (Signed)
Pt called stating that she is having vomiting (twice daily) and diarrhea which started on Sunday 04/27/17; she started doxycycline on 04/26/17 for a sinus infection office visit 04/25/17; recommendations given per nurse triage; pt states that she is not able to get to an appointment because she does not have transportation;  pt verbalizes understanding; will also route to office for review; the pt can be contacted at [phone].  Reason for Disposition . MILD-MODERATE diarrhea (e.g., 1-6 times / day more than normal)  Answer Assessment - Initial Assessment Questions 1. DIARRHEA SEVERITY: "How bad is the diarrhea?" "How many extra stools have you had in the past 24 hours than normal?"    - MILD: Few loose or mushy BMs; increase of 1-3 stools over normal daily number of stools; mild increase in ostomy output.   - MODERATE: Increase of 4-6 stools daily over normal; moderate increase in ostomy output.   - SEVERE (or Worst Possible): Increase of 7 or more stools daily over normal; moderate increase in ostomy output; incontinence.     Moderate 4-6 times 2. ONSET: "When did the diarrhea begin?"      04/26/17 3. BM CONSISTENCY: "How loose or watery is the diarrhea?"      Combination of loose and watery 4. VOMITING: "Are you also vomiting?" If so, ask: "How many times in the past 24 hours?"      Yes; twice since 1 pm yesterday 5. ABDOMINAL PAIN: "Are you having any abdominal pain?" If yes: "What does it feel like?" (e.g., crampy, dull, intermittent, constant)      Cramps in lower stomach which are not as bad as what they were 6. ABDOMINAL PAIN SEVERITY: If present, ask: "How bad is the pain?"  (e.g., Scale 1-10; mild, moderate, or severe)    - MILD (1-3): doesn't interfere with normal activities, abdomen soft and not tender to touch     - MODERATE (4-7): interferes with normal activities or awakens from sleep, tender to touch     - SEVERE (8-10): excruciating pain, doubled over, unable to do any normal  activities       Mild to moderate 7. ORAL INTAKE: If vomiting, "Have you been able to drink liquids?" "How much fluids have you had in the past 24 hours?"     3-4 8 oz cups of gingerale with ice  8. HYDRATION: "Any signs of dehydration?" (e.g., dry mouth [not just dry lips], too weak to stand, dizziness, new weight loss) "When did you last urinate?"     Weakness, a little dizzy; not sure when she last urinated 9. EXPOSURE: "Have you traveled to a foreign country recently?" "Have you been exposed to anyone with diarrhea?" "Could you have eaten any food that was spoiled?"     neighbor's twins who had diarrhea 10. OTHER SYMPTOMS: "Do you have any other symptoms?" (e.g., fever, blood in stool)       no 11. PREGNANCY: "Is there any chance you are pregnant?" "When was your last menstrual period?"       No ablation  Protocols used: DIARRHEA-A-AH

## 2017-04-30 NOTE — Telephone Encounter (Signed)
Please advise 

## 2017-05-01 ENCOUNTER — Encounter (HOSPITAL_COMMUNITY): Payer: Medicare Other

## 2017-05-01 NOTE — Telephone Encounter (Signed)
Pt notified and would like something called in for a yeast infection due to taking the antibiotic, please advise.

## 2017-05-01 NOTE — Telephone Encounter (Addendum)
OV w/any avail provider or ER Stop Doxy if not better Imodium AD prn Thx

## 2017-05-03 MED ORDER — FLUCONAZOLE 150 MG PO TABS: 150.0000 mg | ORAL_TABLET | Freq: Once | ORAL | 1 refills | Status: AC

## 2017-05-03 NOTE — Addendum Note (Signed)
Addended by: Cassandria Anger on: 05/03/2017 07:36 AM   Modules accepted: Orders

## 2017-05-03 NOTE — Telephone Encounter (Signed)
[place] - emailed Thx

## 2017-05-05 ENCOUNTER — Telehealth (HOSPITAL_COMMUNITY): Payer: Self-pay

## 2017-05-05 NOTE — Telephone Encounter (Signed)
Called patient to see when she would be returning to [facility]hab. Was not able to leave message. Will try again later today.

## 2017-05-06 ENCOUNTER — Encounter (HOSPITAL_COMMUNITY): Payer: Medicare Other

## 2017-05-07 ENCOUNTER — Other Ambulatory Visit: Payer: Self-pay | Admitting: Pharmacist

## 2017-05-07 NOTE — Patient Outreach (Signed)
[facility]) Care Management  05/07/2017  [name] 02/21/1960 [id number]   Patient called to let me know [facility] has been bought out by [facility]. HIPAA identifiers were obtained. Patient reported she called [facility] and they told her they no longer deliver.  Patient requested help with transferring her profile to [facility].     [employer] was called on the patient's behalf.  Spoke with Sam and setup an account for the patient.  Patient was encouraged to give [facility] a call tomorrow and provide a debit or credit card.  Plan: Follow up with the patient in 3-5 business days to see how the transition is working.  Elayne Guerin, PharmD, [place] Clinical Pharmacist [phone]

## 2017-05-08 ENCOUNTER — Telehealth (HOSPITAL_COMMUNITY): Payer: Self-pay

## 2017-05-08 ENCOUNTER — Encounter (HOSPITAL_COMMUNITY): Payer: Medicare Other

## 2017-05-08 NOTE — Telephone Encounter (Signed)
Called patient x3 to inquire about absence from [facility]monary Rehab. No answer and no way to leave message.

## 2017-05-13 ENCOUNTER — Telehealth (HOSPITAL_COMMUNITY): Payer: Self-pay

## 2017-05-13 ENCOUNTER — Encounter (HOSPITAL_COMMUNITY): Payer: Medicare Other

## 2017-05-13 NOTE — Telephone Encounter (Signed)
Numerous attempts to contact patient with no response. Left message on patient's cell to let her know we will be dropping her from Pulmonary Rehab.

## 2017-05-15 ENCOUNTER — Encounter (HOSPITAL_COMMUNITY): Payer: Medicare Other

## 2017-05-19 ENCOUNTER — Telehealth: Payer: Self-pay | Admitting: Endocrinology

## 2017-05-19 NOTE — Telephone Encounter (Signed)
Pt called about the pharmacy she was with has folded and pt needs a new Rx sent to the new pharmacy that she goes to [facility] in [place]. sitaGLIPtin (JANUVIA) 50 MG tablet Pt is going to be seeing [facility] the medication was prescribed by Loanne Drilling.  Call pt @ [phone]. Thank you!

## 2017-05-20 ENCOUNTER — Encounter (HOSPITAL_COMMUNITY): Payer: Medicare Other

## 2017-05-20 ENCOUNTER — Other Ambulatory Visit: Payer: Self-pay

## 2017-05-20 MED ORDER — SITAGLIPTIN PHOSPHATE 50 MG PO TABS: 50.0000 mg | ORAL_TABLET | Freq: Every day | ORAL | 0 refills | Status: DC

## 2017-05-20 NOTE — Telephone Encounter (Signed)
I have sent to correct pharmacy with no refills since patient will be seeing Dr. Cruzita Lederer.

## 2017-05-22 ENCOUNTER — Encounter (HOSPITAL_COMMUNITY): Payer: Medicare Other

## 2017-05-23 ENCOUNTER — Encounter: Payer: Self-pay | Admitting: Internal Medicine

## 2017-05-23 ENCOUNTER — Encounter (HOSPITAL_COMMUNITY): Payer: Self-pay | Admitting: *Deleted

## 2017-05-23 ENCOUNTER — Emergency Department (HOSPITAL_COMMUNITY): Payer: Medicare Other

## 2017-05-23 ENCOUNTER — Ambulatory Visit (INDEPENDENT_AMBULATORY_CARE_PROVIDER_SITE_OTHER): Payer: Medicare Other | Admitting: Internal Medicine

## 2017-05-23 ENCOUNTER — Other Ambulatory Visit: Payer: Self-pay

## 2017-05-23 ENCOUNTER — Inpatient Hospital Stay (HOSPITAL_COMMUNITY)
Admission: EM | Admit: 2017-05-23 | Discharge: 2017-05-26 | DRG: 125 | Disposition: A | Discharge status: Home Health | Payer: Medicare Other | Attending: Internal Medicine | Admitting: Internal Medicine

## 2017-05-23 DIAGNOSIS — R402414 Glasgow coma scale score 13-15, 24 hours or more after hospital admission: Secondary | ICD-10-CM | POA: Diagnosis present

## 2017-05-23 DIAGNOSIS — H04301 Unspecified dacryocystitis of right lacrimal passage: Secondary | ICD-10-CM | POA: Diagnosis not present

## 2017-05-23 DIAGNOSIS — J45909 Unspecified asthma, uncomplicated: Secondary | ICD-10-CM | POA: Diagnosis present

## 2017-05-23 DIAGNOSIS — K219 Gastro-esophageal reflux disease without esophagitis: Secondary | ICD-10-CM | POA: Diagnosis present

## 2017-05-23 DIAGNOSIS — N182 Chronic kidney disease, stage 2 (mild): Secondary | ICD-10-CM | POA: Diagnosis present

## 2017-05-23 DIAGNOSIS — E1122 Type 2 diabetes mellitus with diabetic chronic kidney disease: Secondary | ICD-10-CM | POA: Diagnosis present

## 2017-05-23 DIAGNOSIS — I5032 Chronic diastolic (congestive) heart failure: Secondary | ICD-10-CM | POA: Diagnosis present

## 2017-05-23 DIAGNOSIS — M797 Fibromyalgia: Secondary | ICD-10-CM | POA: Diagnosis present

## 2017-05-23 DIAGNOSIS — F329 Major depressive disorder, single episode, unspecified: Secondary | ICD-10-CM | POA: Diagnosis present

## 2017-05-23 DIAGNOSIS — Z96653 Presence of artificial knee joint, bilateral: Secondary | ICD-10-CM | POA: Diagnosis present

## 2017-05-23 DIAGNOSIS — M069 Rheumatoid arthritis, unspecified: Secondary | ICD-10-CM | POA: Diagnosis present

## 2017-05-23 DIAGNOSIS — Z7901 Long term (current) use of anticoagulants: Secondary | ICD-10-CM | POA: Diagnosis not present

## 2017-05-23 DIAGNOSIS — Z8673 Personal history of transient ischemic attack (TIA), and cerebral infarction without residual deficits: Secondary | ICD-10-CM

## 2017-05-23 DIAGNOSIS — M1990 Unspecified osteoarthritis, unspecified site: Secondary | ICD-10-CM | POA: Diagnosis present

## 2017-05-23 DIAGNOSIS — Z7984 Long term (current) use of oral hypoglycemic drugs: Secondary | ICD-10-CM

## 2017-05-23 DIAGNOSIS — G4733 Obstructive sleep apnea (adult) (pediatric): Secondary | ICD-10-CM | POA: Diagnosis present

## 2017-05-23 DIAGNOSIS — J329 Chronic sinusitis, unspecified: Secondary | ICD-10-CM | POA: Diagnosis present

## 2017-05-23 DIAGNOSIS — Z883 Allergy status to other anti-infective agents status: Secondary | ICD-10-CM

## 2017-05-23 DIAGNOSIS — I130 Hypertensive heart and chronic kidney disease with heart failure and stage 1 through stage 4 chronic kidney disease, or unspecified chronic kidney disease: Secondary | ICD-10-CM | POA: Diagnosis present

## 2017-05-23 DIAGNOSIS — Z86711 Personal history of pulmonary embolism: Secondary | ICD-10-CM

## 2017-05-23 DIAGNOSIS — Z87891 Personal history of nicotine dependence: Secondary | ICD-10-CM

## 2017-05-23 DIAGNOSIS — L0390 Cellulitis, unspecified: Secondary | ICD-10-CM | POA: Diagnosis present

## 2017-05-23 DIAGNOSIS — Z7951 Long term (current) use of inhaled steroids: Secondary | ICD-10-CM

## 2017-05-23 DIAGNOSIS — Z91041 Radiographic dye allergy status: Secondary | ICD-10-CM

## 2017-05-23 DIAGNOSIS — I2699 Other pulmonary embolism without acute cor pulmonale: Secondary | ICD-10-CM | POA: Diagnosis present

## 2017-05-23 DIAGNOSIS — E118 Type 2 diabetes mellitus with unspecified complications: Secondary | ICD-10-CM

## 2017-05-23 DIAGNOSIS — R590 Localized enlarged lymph nodes: Secondary | ICD-10-CM | POA: Diagnosis present

## 2017-05-23 DIAGNOSIS — H05011 Cellulitis of right orbit: Secondary | ICD-10-CM

## 2017-05-23 DIAGNOSIS — Z881 Allergy status to other antibiotic agents status: Secondary | ICD-10-CM

## 2017-05-23 DIAGNOSIS — Z86718 Personal history of other venous thrombosis and embolism: Secondary | ICD-10-CM

## 2017-05-23 DIAGNOSIS — L03211 Cellulitis of face: Secondary | ICD-10-CM | POA: Diagnosis not present

## 2017-05-23 DIAGNOSIS — Z91048 Other nonmedicinal substance allergy status: Secondary | ICD-10-CM

## 2017-05-23 DIAGNOSIS — J342 Deviated nasal septum: Secondary | ICD-10-CM | POA: Diagnosis present

## 2017-05-23 DIAGNOSIS — D869 Sarcoidosis, unspecified: Secondary | ICD-10-CM | POA: Diagnosis present

## 2017-05-23 DIAGNOSIS — K1230 Oral mucositis (ulcerative), unspecified: Secondary | ICD-10-CM | POA: Diagnosis present

## 2017-05-23 DIAGNOSIS — Z888 Allergy status to other drugs, medicaments and biological substances status: Secondary | ICD-10-CM

## 2017-05-23 DIAGNOSIS — L03213 Periorbital cellulitis: Secondary | ICD-10-CM | POA: Diagnosis present

## 2017-05-23 DIAGNOSIS — I10 Essential (primary) hypertension: Secondary | ICD-10-CM | POA: Diagnosis present

## 2017-05-23 DIAGNOSIS — Z8619 Personal history of other infectious and parasitic diseases: Secondary | ICD-10-CM

## 2017-05-23 DIAGNOSIS — F419 Anxiety disorder, unspecified: Secondary | ICD-10-CM | POA: Diagnosis present

## 2017-05-23 DIAGNOSIS — Z79899 Other long term (current) drug therapy: Secondary | ICD-10-CM

## 2017-05-23 LAB — URINALYSIS, ROUTINE W REFLEX MICROSCOPIC
Glucose, UA: NEGATIVE mg/dL
Hgb urine dipstick: NEGATIVE
Ketones, ur: 5 mg/dL — AB
LEUKOCYTES UA: NEGATIVE
Nitrite: NEGATIVE
Protein, ur: 30 mg/dL — AB
SPECIFIC GRAVITY, URINE: 1.038 — AB (ref 1.005–1.030)
pH: 5 (ref 5.0–8.0)

## 2017-05-23 LAB — CBC WITH DIFFERENTIAL/PLATELET
BASOS ABS: 0 10*3/uL (ref 0.0–0.1)
Basophils Relative: 0 %
Eosinophils Absolute: 0.2 10*3/uL (ref 0.0–0.7)
Eosinophils Relative: 2 %
HEMATOCRIT: 37.8 % (ref 36.0–46.0)
Hemoglobin: 11.2 g/dL — ABNORMAL LOW (ref 12.0–15.0)
LYMPHS ABS: 2.7 10*3/uL (ref 0.7–4.0)
LYMPHS PCT: 26 %
MCH: 25.3 pg — AB (ref 26.0–34.0)
MCHC: 29.6 g/dL — AB (ref 30.0–36.0)
MCV: 85.3 fL (ref 78.0–100.0)
MONOS PCT: 10 %
Monocytes Absolute: 1 10*3/uL (ref 0.1–1.0)
NEUTROS ABS: 6.5 10*3/uL (ref 1.7–7.7)
Neutrophils Relative %: 62 %
Platelets: 224 10*3/uL (ref 150–400)
RBC: 4.43 MIL/uL (ref 3.87–5.11)
RDW: 18.9 % — AB (ref 11.5–15.5)
WBC: 10.4 10*3/uL (ref 4.0–10.5)

## 2017-05-23 LAB — COMPREHENSIVE METABOLIC PANEL
ALBUMIN: 3 g/dL — AB (ref 3.5–5.0)
ALT: 19 U/L (ref 14–54)
ANION GAP: 12 (ref 5–15)
AST: 27 U/L (ref 15–41)
Alkaline Phosphatase: 105 U/L (ref 38–126)
BUN: 13 mg/dL (ref 6–20)
CHLORIDE: 108 mmol/L (ref 101–111)
CO2: 21 mmol/L — AB (ref 22–32)
Calcium: 9.1 mg/dL (ref 8.9–10.3)
Creatinine, Ser: 1.19 mg/dL — ABNORMAL HIGH (ref 0.44–1.00)
GFR calc Af Amer: 58 mL/min — ABNORMAL LOW (ref >60)
GFR calc non Af Amer: 50 mL/min — ABNORMAL LOW (ref >60)
GLUCOSE: 121 mg/dL — AB (ref 65–99)
POTASSIUM: 3.9 mmol/L (ref 3.5–5.1)
SODIUM: 141 mmol/L (ref 135–145)
Total Bilirubin: 0.5 mg/dL (ref 0.3–1.2)
Total Protein: 7.2 g/dL (ref 6.5–8.1)

## 2017-05-23 LAB — I-STAT CG4 LACTIC ACID, ED
LACTIC ACID, VENOUS: 1.51 mmol/L (ref 0.5–1.9)
Lactic Acid, Venous: 1.64 mmol/L (ref 0.5–1.9)

## 2017-05-23 LAB — GLUCOSE, CAPILLARY: Glucose-Capillary: 138 mg/dL — ABNORMAL HIGH (ref 65–99)

## 2017-05-23 MED ORDER — VANCOMYCIN HCL IN DEXTROSE 1-5 GM/200ML-% IV SOLN: 1000.0000 mg | Freq: Two times a day (BID) | INTRAVENOUS | Status: DC

## 2017-05-23 MED ORDER — ONDANSETRON HCL 4 MG/2ML IJ SOLN: 4.0000 mg | Freq: Four times a day (QID) | INTRAMUSCULAR | Status: DC | PRN

## 2017-05-23 MED ORDER — CARVEDILOL 12.5 MG PO TABS
12.5000 mg | ORAL_TABLET | Freq: Two times a day (BID) | ORAL | Status: DC
  Administered 2017-05-23 – 2017-05-26 (×6): 12.5 mg via ORAL
  Filled 2017-05-23 (×5): qty 1

## 2017-05-23 MED ORDER — ONDANSETRON HCL 4 MG PO TABS: 4.0000 mg | ORAL_TABLET | Freq: Four times a day (QID) | ORAL | Status: DC | PRN

## 2017-05-23 MED ORDER — MORPHINE SULFATE (PF) 4 MG/ML IV SOLN
1.0000 mg | INTRAVENOUS | Status: DC | PRN
  Administered 2017-05-23 – 2017-05-25 (×5): 1 mg via INTRAVENOUS
  Filled 2017-05-23 (×5): qty 1

## 2017-05-23 MED ORDER — FENTANYL CITRATE (PF) 100 MCG/2ML IJ SOLN
50.0000 ug | Freq: Once | INTRAMUSCULAR | Status: AC
  Administered 2017-05-23: 50 ug via INTRAVENOUS
  Filled 2017-05-23: qty 2

## 2017-05-23 MED ORDER — CLOBETASOL PROPIONATE 0.05 % EX OINT
TOPICAL_OINTMENT | Freq: Two times a day (BID) | CUTANEOUS | Status: DC
  Administered 2017-05-25 – 2017-05-26 (×3): via TOPICAL
  Filled 2017-05-23: qty 15

## 2017-05-23 MED ORDER — GABAPENTIN 300 MG PO CAPS
300.0000 mg | ORAL_CAPSULE | Freq: Two times a day (BID) | ORAL | Status: DC | PRN
  Administered 2017-05-25: 300 mg via ORAL
  Filled 2017-05-23: qty 1

## 2017-05-23 MED ORDER — DIPHENHYDRAMINE HCL 50 MG/ML IJ SOLN
25.0000 mg | Freq: Once | INTRAMUSCULAR | Status: AC
Start: 2017-05-23 — End: 2017-05-23
  Administered 2017-05-23: 25 mg via INTRAVENOUS
  Filled 2017-05-23: qty 1

## 2017-05-23 MED ORDER — ACETAMINOPHEN 325 MG PO TABS
650.0000 mg | ORAL_TABLET | Freq: Four times a day (QID) | ORAL | Status: DC | PRN
  Administered 2017-05-25: 650 mg via ORAL
  Filled 2017-05-23 (×2): qty 2

## 2017-05-23 MED ORDER — VANCOMYCIN HCL IN DEXTROSE 1-5 GM/200ML-% IV SOLN
1000.0000 mg | Freq: Two times a day (BID) | INTRAVENOUS | Status: DC
  Administered 2017-05-24 – 2017-05-26 (×5): 1000 mg via INTRAVENOUS
  Filled 2017-05-23 (×6): qty 200

## 2017-05-23 MED ORDER — CLONAZEPAM 0.5 MG PO TABS
0.5000 mg | ORAL_TABLET | Freq: Every day | ORAL | Status: DC
  Administered 2017-05-23 – 2017-05-25 (×3): 1 mg via ORAL
  Filled 2017-05-23 (×3): qty 2

## 2017-05-23 MED ORDER — HEPARIN (PORCINE) IN NACL 100-0.45 UNIT/ML-% IJ SOLN
1550.0000 [IU]/h | INTRAMUSCULAR | Status: DC
  Administered 2017-05-23: 1450 [IU]/h via INTRAVENOUS
  Administered 2017-05-24: 1650 [IU]/h via INTRAVENOUS
  Filled 2017-05-23 (×2): qty 250

## 2017-05-23 MED ORDER — PIPERACILLIN-TAZOBACTAM 3.375 G IVPB
3.3750 g | Freq: Three times a day (TID) | INTRAVENOUS | Status: DC
  Administered 2017-05-23 – 2017-05-26 (×8): 3.375 g via INTRAVENOUS
  Filled 2017-05-23 (×9): qty 50

## 2017-05-23 MED ORDER — CARVEDILOL 12.5 MG PO TABS
12.5000 mg | ORAL_TABLET | Freq: Two times a day (BID) | ORAL | Status: DC
  Filled 2017-05-23: qty 1

## 2017-05-23 MED ORDER — INSULIN ASPART 100 UNIT/ML ~~LOC~~ SOLN: 0.0000 [IU] | Freq: Three times a day (TID) | SUBCUTANEOUS | Status: DC

## 2017-05-23 MED ORDER — MOMETASONE FURO-FORMOTEROL FUM 200-5 MCG/ACT IN AERO
2.0000 | INHALATION_SPRAY | Freq: Two times a day (BID) | RESPIRATORY_TRACT | Status: DC
  Administered 2017-05-24 – 2017-05-26 (×5): 2 via RESPIRATORY_TRACT
  Filled 2017-05-23: qty 8.8

## 2017-05-23 MED ORDER — LINAGLIPTIN 5 MG PO TABS
5.0000 mg | ORAL_TABLET | Freq: Every day | ORAL | Status: DC
  Administered 2017-05-24 – 2017-05-26 (×3): 5 mg via ORAL
  Filled 2017-05-23 (×3): qty 1

## 2017-05-23 MED ORDER — PIPERACILLIN-TAZOBACTAM 3.375 G IVPB 30 MIN
3.3750 g | Freq: Once | INTRAVENOUS | Status: AC
  Administered 2017-05-23: 3.375 g via INTRAVENOUS
  Filled 2017-05-23: qty 50

## 2017-05-23 MED ORDER — POTASSIUM CHLORIDE CRYS ER 10 MEQ PO TBCR
10.0000 meq | EXTENDED_RELEASE_TABLET | Freq: Every day | ORAL | Status: DC
  Administered 2017-05-24 – 2017-05-26 (×3): 10 meq via ORAL
  Filled 2017-05-23 (×6): qty 1

## 2017-05-23 MED ORDER — TOPIRAMATE 25 MG PO TABS
50.0000 mg | ORAL_TABLET | Freq: Two times a day (BID) | ORAL | Status: DC
  Administered 2017-05-23 – 2017-05-26 (×6): 50 mg via ORAL
  Filled 2017-05-23 (×6): qty 2

## 2017-05-23 MED ORDER — IOHEXOL 300 MG/ML  SOLN
100.0000 mL | Freq: Once | INTRAMUSCULAR | Status: AC | PRN
  Administered 2017-05-23: 100 mL via INTRAVENOUS

## 2017-05-23 MED ORDER — VENLAFAXINE HCL ER 75 MG PO CP24
75.0000 mg | ORAL_CAPSULE | Freq: Every day | ORAL | Status: DC
  Administered 2017-05-24 – 2017-05-26 (×3): 75 mg via ORAL
  Filled 2017-05-23 (×3): qty 1

## 2017-05-23 MED ORDER — VANCOMYCIN HCL 10 G IV SOLR
2000.0000 mg | Freq: Once | INTRAVENOUS | Status: AC
  Administered 2017-05-23: 2000 mg via INTRAVENOUS
  Filled 2017-05-23: qty 2000

## 2017-05-23 MED ORDER — ALBUTEROL SULFATE (2.5 MG/3ML) 0.083% IN NEBU: 3.0000 mL | INHALATION_SOLUTION | RESPIRATORY_TRACT | Status: DC | PRN

## 2017-05-23 MED ORDER — ACETAMINOPHEN 650 MG RE SUPP
650.0000 mg | Freq: Four times a day (QID) | RECTAL | Status: DC | PRN
Start: 2017-05-23 — End: 2017-05-26

## 2017-05-23 MED ORDER — FUROSEMIDE 40 MG PO TABS
40.0000 mg | ORAL_TABLET | Freq: Every day | ORAL | Status: DC
  Administered 2017-05-24 – 2017-05-26 (×2): 40 mg via ORAL
  Filled 2017-05-23: qty 1
  Filled 2017-05-23 (×3): qty 2

## 2017-05-23 MED ORDER — PANTOPRAZOLE SODIUM 40 MG PO TBEC
40.0000 mg | DELAYED_RELEASE_TABLET | Freq: Every day | ORAL | Status: DC
  Administered 2017-05-24 – 2017-05-26 (×3): 40 mg via ORAL
  Filled 2017-05-23 (×4): qty 1

## 2017-05-23 MED ORDER — SODIUM CHLORIDE 0.9 % IV SOLN
Freq: Once | INTRAVENOUS | Status: AC
  Administered 2017-05-23: 17:00:00 via INTRAVENOUS

## 2017-05-23 NOTE — Patient Instructions (Signed)
Go to [facility] now

## 2017-05-23 NOTE — ED Notes (Signed)
Patient transported to CT 

## 2017-05-23 NOTE — Progress Notes (Signed)
ANTICOAGULATION CONSULT NOTE - Initial Consult  Pharmacy Consult for heparin Indication: pulmonary embolus  Allergies  Allergen Reactions  . Vancomycin Itching, Rash and Other (See Comments)    Patient had an allergic reaction that required Benadryl  . Belsomra [Suvorexant] Other (See Comments)    [place] out of bed while asleep: "I'm waking up as I'm falling on the floor;" "Night terrors"  . Doxycycline Diarrhea and Nausea And Vomiting  . Enalapril Maleate Cough  . Hydrochlorothiazide Other (See Comments)    Low potassium levels   . Hydroxychloroquine Sulfate Other (See Comments)    Vision changes   . Iodine Other (See Comments)    Mother was intolerant--CODED on CT table---pt never tried  . Latex Hives  . Lyrica [Pregabalin] Other (See Comments)    "Made me feel high"  . Nickel Other (See Comments)    Blisters  Pt has a titanium right and left knee - nickel causes skin irritations that form into blisters and sores  . Other Other (See Comments)    Patient has Sarcoidosis and can't tolerate any metals  . Tape Other (See Comments)    Medical tape causes bruising    Patient Measurements: Height: 5\' 7"  (170.2 cm) Weight: 276 lb (125.2 kg) IBW/kg (Calculated) : 61.6 Heparin Dosing Weight: 92 kg  Vital Signs: Temp: 98 F (36.7 C) (05/10 1305) Temp Source: Axillary (05/10 1305) BP: 112/54 (05/10 1305) Pulse Rate: 71 (05/10 1305)  Labs: Recent Labs    05/23/17 0910  HGB 11.2*  HCT 37.8  PLT 224  CREATININE 1.19*    Estimated Creatinine Clearance: 72.5 mL/min (A) (by C-G formula based on SCr of 1.19 mg/dL (H)).  Assessment: CC/HPI: 57 YO F presenting with cellulitis to right face  PMH: DM sarcoidosis on humira  Anticoag: rivaroxaban pta for hx of PE - last dose 5/9 2000  Renal: Scr 1.19  Heme/Onc: H&H 11.2/37.8, Plt 224  Goal of Therapy:  Heparin level 0.3-0.7 units/ml aPTT 66 - 102 seconds Monitor platelets by anticoagulation protocol: Yes   Plan:   Heparin 1450 units/hr (no bolus since on rivaroxaban) Daily HL aPTT CBC F/U return to [facility]  Levester Fresh, PharmD, BCPS, BCCCP Clinical Pharmacist Clinical phone for 05/23/2017 from 1430 - 2300: [phone] If after 2300, please call main pharmacy at: [phone] 05/23/2017 9:17 PM

## 2017-05-23 NOTE — Consult Note (Signed)
Cc: Eyelid swelling.        Ophthalmology HPI: This is a 57 y.o.  female with a past ocular history listed below that presents with history of sarcoidosis, diabetes  That presents with right periorbital swelling tenderness and pain. Starting last sudnay she has worsening redness and pain thinking it was "sinusitis."  It worsened until yesterday when her eyelid started to spontaneously begin draining. Her PCP recommended further work up in the ED.   Patient seen and evaluated in the ED.     Past Ocular History:  Cataracts Refractive error.    Last Eye Exam:  "Many years ago".     Primary Eye Care: Unknown.    Past Medical History:  Diagnosis Date  . Allergic rhinitis   . ALLERGIC RHINITIS 10/26/2009  . Anxiety   . ANXIETY 08/14/2006  . B12 DEFICIENCY 08/25/2007  . Confusion   . Depression    takes Lexapro daily  . Diabetes mellitus without complication ([place])    was on insulin but has been off since Nov 2015 and now only takes Metformin daily  . DYSPNEA 04/28/2009   with exertion  . Esophageal reflux    takes Nexium daily  . Fibromyalgia   . Headache    last migraine 2-63yrs ago;takes Topamax daily  . History of shingles   . Hypertension    takes Coreg daily  . Insomnia    takes Nortriptyline nightly   . Joint pain   . Joint swelling   . Left knee pain   . Lichen planus   . Long-term memory loss   . Nocturia   . OSA (obstructive sleep apnea)    doesn't use CPAP;sleep study in epic from 2006  . Osteoarthritis   . Osteoarthritis   . Pneumonia    over 30 yrs ago  . Protein calorie malnutrition ([place])   . Rheumatoid arthritis ([place])   . Sarcoidosis    Dr. Lolita Patella  . Short-term memory loss   . Shortness of breath   . TIA (transient ischemic attack)   . Unsteady gait   . Urinary urgency   . Vitamin D deficiency    is supposed to take Vit D but can't afford it  . VITAMIN D DEFICIENCY 08/25/2007     Past Surgical History:  Procedure Laterality Date  .  APPENDECTOMY    . arthroscopic knee surgery Right 11-12-04  . AXILLARY ABCESS IRRIGATION AND DEBRIDEMENT  Jul & Aug2012  . CARPAL TUNNEL RELEASE Left 05/23/2014   Procedure: CARPAL TUNNEL RELEASE;  Surgeon: Meredith Pel, MD;  Location: [facility];  Service: Orthopedics;  Laterality: Left;  . cyst removed from top of buttocks  at age 39  . ENDOMETRIAL ABLATION    . TOTAL KNEE ARTHROPLASTY Right 11/15/2014   Procedure: TOTAL RIGHT KNEE ARTHROPLASTY;  Surgeon: Meredith Pel, MD;  Location: [facility];  Service: Orthopedics;  Laterality: Right;  . TOTAL KNEE ARTHROPLASTY Left 07/13/2015   Procedure: LEFT TOTAL KNEE ARTHROPLASTY;  Surgeon: Meredith Pel, MD;  Location: [facility];  Service: Orthopedics;  Laterality: Left;     Social History   Socioeconomic History  . Marital status: Single    Spouse name: Not on file  . Number of children: Not on file  . Years of education: Not on file  . Highest education level: Not on file  Occupational History  . Occupation: disabled  Social Needs  . Financial resource strain: Not on file  . Food insecurity:  Worry: Not on file    Inability: Not on file  . Transportation needs:    Medical: Not on file    Non-medical: Not on file  Tobacco Use  . Smoking status: Former Smoker    Packs/day: 0.50    Years: 10.00    Pack years: 5.00    Types: Cigarettes  . Smokeless tobacco: Never Used  . Tobacco comment: quit smoking in 2004  Substance and Sexual Activity  . Alcohol use: No  . Drug use: No  . Sexual activity: Not Currently  Lifestyle  . Physical activity:    Days per week: Not on file    Minutes per session: Not on file  . Stress: Not on file  Relationships  . Social connections:    Talks on phone: Not on file    Gets together: Not on file    Attends religious service: Not on file    Active member of club or organization: Not on file    Attends meetings of clubs or organizations: Not on file    Relationship status: Not on file  .  Intimate partner violence:    Fear of current or ex partner: Not on file    Emotionally abused: Not on file    Physically abused: Not on file    Forced sexual activity: Not on file  Other Topics Concern  . Not on file  Social History Narrative   Single, broke up with partner in 2008.     Allergies  Allergen Reactions  . Vancomycin Itching, Rash and Other (See Comments)    Patient had an allergic reaction that required Benadryl  . Belsomra [Suvorexant] Other (See Comments)    [place] out of bed while asleep: "I'm waking up as I'm falling on the floor;" "Night terrors"  . Doxycycline Diarrhea and Nausea And Vomiting  . Enalapril Maleate Cough  . Hydrochlorothiazide Other (See Comments)    Low potassium levels   . Hydroxychloroquine Sulfate Other (See Comments)    Vision changes   . Iodine Other (See Comments)    Mother was intolerant--CODED on CT table---pt never tried  . Latex Hives  . Lyrica [Pregabalin] Other (See Comments)    "Made me feel high"  . Nickel Other (See Comments)    Blisters  Pt has a titanium right and left knee - nickel causes skin irritations that form into blisters and sores  . Other Other (See Comments)    Patient has Sarcoidosis and can't tolerate any metals  . Tape Other (See Comments)    Medical tape causes bruising     No current facility-administered medications on file prior to encounter.    Current Outpatient Medications on File Prior to Encounter  Medication Sig Dispense Refill  . Adalimumab (HUMIRA) 40 MG/0.8ML PSKT Inject 40 mg into the skin every 7 (seven) days.     . carvedilol (COREG) 12.5 MG tablet TAKE 1 TABLET BY MOUTH TWICE DAILY WITH A MEAL 180 tablet 1  . chlorhexidine (PERIDEX) 0.12 % solution Use as directed 5 mLs in the mouth or throat 2 (two) times daily. Pt is to swish and spit. 120 mL 0  . clobetasol ointment (TEMOVATE) 1.88 % Apply 1 application topically See admin instructions. Apply to the scalp two to three times a day  ([place]ichen planus)    . clonazePAM (KLONOPIN) 0.5 MG tablet Take 1-2 tablets BY MOUTH at bedtime (Patient taking differently: Take 0.5 mg by mouth at bedtime) 60 tablet 2  . esomeprazole (  NEXIUM) 40 MG capsule Take 1 capsule (40 mg total) by mouth daily before breakfast. 90 capsule 3  . furosemide (LASIX) 40 MG tablet TAKE 1 TABLET BY MOUTH EVERY MORNING FOR EDEMA 90 tablet 1  . gabapentin (NEURONTIN) 300 MG capsule Take 1 capsule (300 mg total) by mouth 2 (two) times daily as needed (for pain). 180 capsule 1  . metFORMIN (GLUCOPHAGE-XR) 500 MG 24 hr tablet TAKE 2 TABLETS(1000 MG) BY MOUTH DAILY WITH BREAKFAST 180 tablet 0  . potassium chloride (KLOR-CON 10) 10 MEQ tablet Take 1 tablet (10 mEq total) by mouth daily. 90 tablet 3  . sitaGLIPtin (JANUVIA) 50 MG tablet Take 1 tablet (50 mg total) by mouth daily. 90 tablet 0  . SYMBICORT 160-4.5 MCG/ACT inhaler Inhale 2 puffs into the lungs TWICE DAILY 120/4=30 10.2 g 5  . topiramate (TOPAMAX) 50 MG tablet Take 1 tablet (50 mg total) by mouth 2 (two) times daily. 180 tablet 1  . venlafaxine XR (EFFEXOR-XR) 75 MG 24 hr capsule Take 1 capsule (75 mg total) by mouth daily with breakfast. 30 capsule 11  . VENTOLIN HFA 108 (90 Base) MCG/ACT inhaler Inhale 2 puffs into the lungs every 6 (six) hours as needed for wheezing or shortness of breath. 200/8=25 18 g 11  . VOLTAREN 1 % GEL Apply 2 g topically 2 (two) times daily as needed (for pain). 100/4=25 100 g 2  . XARELTO 20 MG TABS tablet Take 1 tablet (20 mg total) by mouth daily. with food (Patient taking differently: Take 20 mg by mouth at bedtime. ) 90 tablet 0  . Lancets (FREESTYLE) lancets 1 each.    . [DISCONTINUED] potassium chloride (K-DUR,KLOR-CON) 10 MEQ tablet TAKE 1 TABLET BY MOUTH DAILY 90 tablet 2     ROS    Exam:  General: Awake, Alert, Oriented *3  Vision (near): with correction   OD: 8point  OS: 6 point  Confrontational Field:   Full to count fingers, both eyes  Extraocular  Motility:  Full ductions and versions, both eyes    External:   Right periorbital lower lid edema with erythema. Spontaneous drainage over the lacrimal sac (scant discharge)   Pupils  OD: 84mm to 3mm reactive without afferent pupillary defect (APD)  OS: 24mm to 68mm reactive without afferent pupillary defect (APD)   IOP(tonopen)  OD: 16  OS: 14  Slit Lamp Exam:  Lids/Lashes  OD: Lid edema/erythema inferior>Superior  OS: Normal lids and lashes, nor lesion or injury  Conjucntiva/Sclera  OD: White and quiet  OS: White and quiet  Cornea  OD: Clear without abrasion or defect  OS: Clear without abrasion or defect  Anterior Chamber  OD: Deep and quiet  OS: Deep and quiet  Iris  OD: Normal iris architecture  OS: Normal Iris Architecture   Lens  OD: Trace NSC  OS: Trace NSC   Anterior Vitreous  OD: Clear, without cell  OS: Clear without cell   POSTERIOR POLE EXAM   View:   OD: 20/20 view without opacities  OS: 20/20 view without opacities  Vitreous:   OD: Clear, no cell  OS: Clear, no cell  Disc:   OD: flat, sharp margin, with appropriate color  OS: flat, sharp margin, with appropriate color  C:D Ratio:   OD: 0.4  OS: 0.4  Macula  OD: Flat, with appropriate light reflex  OS: Flat with appropriate light reflex  Vessels  OD: Normal vasculature  OS: Normal vasculature  Periphery  OD: Flat 360 degrees  without tear, hole or detachment  OS: Flat 360 degrees without tear, hole or detachment     Assessment and Plan:   This is 57 y.o.  female with acute dacryocystitis with spontaneous drainage.   No acute surgical intervention required at this time.  No purulence or specimen for culture collection obtained on exam.   Recommend broad spectrum antibiotics and warm compresses. Recommend patient follow up with ophthalmology within 3 days of discharge where we will plan an outpatient dacryocystorhinostomy.     Please do not hesitate to contact me with  questions or concerns.    Julian Reil, M.D.  [facility] [address] [place], [place] [address] [phone] ([phone]

## 2017-05-23 NOTE — Assessment & Plan Note (Addendum)
[name] will go to [facility] ER now Her friend is driving   R/o abscess  R eye is shut w/swelling and erythema.There is a drainage of pus out of the ?abscess tract medially - yellow pus

## 2017-05-23 NOTE — ED Provider Notes (Signed)
[facility] EMERGENCY DEPARTMENT Provider Note  CSN: [id number] Arrival date & time: 05/23/17 3557  Chief Complaint(s) No chief complaint on file.  HPI [name] is a 57 y.o. female with extensive past medical history including diabetes, sarcoidosis on Humira who presents to the emergency department with 4 days of right periorbital swelling and redness.  Patient reports that this began slowly 4 days ago and gradually got worse over 1 day.  This morning when she awoke she noted that there was purulent discharge coming from an area.  Patient was seen by her primary care provider who recommended she present to the emergency department for evaluation of possible orbital cellulitis.  She is endorsing mild ocular pain and blurry vision.  She endorses maxillary and frontal sinus pain for several weeks.  She was diagnosed with sinusitis last month and treated with doxycycline.  She also endorses right-sided facial pain worse with palpation.  No alleviating factors.  She denies any fevers but endorses chills and myalgias.  Denies any nausea or vomiting.  No chest pain or shortness of breath.  No abdominal pain.  No diarrhea or urinary symptoms.  HPI  Past Medical History Past Medical History:  Diagnosis Date  . Allergic rhinitis   . ALLERGIC RHINITIS 10/26/2009  . Anxiety   . ANXIETY 08/14/2006  . B12 DEFICIENCY 08/25/2007  . Confusion   . Depression    takes Lexapro daily  . Diabetes mellitus without complication ([place])    was on insulin but has been off since Nov 2015 and now only takes Metformin daily  . DYSPNEA 04/28/2009   with exertion  . Esophageal reflux    takes Nexium daily  . Fibromyalgia   . Headache    last migraine 2-66yrs ago;takes Topamax daily  . History of shingles   . Hypertension    takes Coreg daily  . Insomnia    takes Nortriptyline nightly   . Joint pain   . Joint swelling   . Left knee pain   . Lichen planus   . Long-term memory loss    . Nocturia   . OSA (obstructive sleep apnea)    doesn't use CPAP;sleep study in epic from 2006  . Osteoarthritis   . Osteoarthritis   . Pneumonia    over 30 yrs ago  . Protein calorie malnutrition ([place])   . Rheumatoid arthritis ([place])   . Sarcoidosis    Dr. Lolita Patella  . Short-term memory loss   . Shortness of breath   . TIA (transient ischemic attack)   . Unsteady gait   . Urinary urgency   . Vitamin D deficiency    is supposed to take Vit D but can't afford it  . VITAMIN D DEFICIENCY 08/25/2007   Patient Active Problem List   Diagnosis Date Noted  . Orbital cellulitis on right 05/23/2017  . Left arm pain 03/10/2017  . COPD (chronic obstructive pulmonary disease) ([place]) 12/30/2016  . Syncope 12/17/2016  . Low back pain 12/17/2016  . Diastolic dysfunction 32/20/2542  . OSA (obstructive sleep apnea) 09/30/2016  . Chronic respiratory failure with hypoxia ([place]) 08/23/2016  . Pulmonary embolism ([place]) 07/21/2016  . DVT (deep venous thrombosis) ([place]) 07/21/2016  . Cough 07/11/2016  . Balance problems 06/14/2016  . Pain of right scapula 06/07/2016  . Acute bronchitis 01/30/2016  . Weight gain 12/20/2015  . Acute pulmonary embolism ([place]) 08/17/2015  . Diabetes mellitus type 2 with complications ([place]) 70/62/3762  . Postoperative anemia due to acute  blood loss 02/01/2015  . Arthritis of knee, degenerative 11/15/2014  . Calf pain 01/21/2014  . Insomnia 12/23/2013  . Leg ulcer, left ([place]) 09/13/2013  . Abnormal SPEP 06/04/2013  . Memory loss 04/27/2013  . Panic attacks 03/17/2013  . Right knee pain 12/29/2012  . Rash and nonspecific skin eruption 10/27/2012  . Dizziness and giddiness 09/07/2012  . Pulmonary nodules 07/20/2012  . CAD in native artery 05/28/2012  . Seborrheic dermatitis 01/20/2012  . Recurrent knee pain 09/03/2011  . Knee pain, left 08/03/2011  . Leg pain, left 07/17/2011  . Polydipsia 05/09/2011  . Abscess of axilla, left 08/10/2010  . Obesity 03/07/2010  .  INSOMNIA, CHRONIC 03/07/2010  . NAUSEA 03/07/2010  . Avulsion fracture of tooth 03/07/2010  . Allergic rhinitis 10/26/2009  . GRIEF REACTION 08/01/2009  . DYSPNEA 04/28/2009  . MAXILLARY SINUSITIS 10/05/2008  . Essential hypertension 08/27/2007  . B12 deficiency 08/25/2007  . Vitamin D deficiency 08/25/2007  . ELEVATED BP 07/10/2007  . VISUAL CHANGES 07/02/2007  . UTI 02/07/2007  . ABDOMINAL PAIN RIGHT LOWER QUADRANT 02/07/2007  . SINUSITIS, ACUTE 02/02/2007  . Osteoarthritis 02/02/2007  . Sarcoidosis 08/14/2006  . Anxiety state 08/14/2006  . Major depressive disorder, recurrent episode ([place]) 08/14/2006  . GERD 08/14/2006   Home Medication(s) Prior to Admission medications   Medication Sig Start Date End Date Taking? Authorizing Provider  Adalimumab (HUMIRA) 40 MG/0.8ML PSKT Inject 40 mg into the skin every 14 (fourteen) days.    [provider]  carvedilol (COREG) 12.5 MG tablet TAKE 1 TABLET BY MOUTH TWICE DAILY WITH A MEAL 01/10/17   Plotnikov, Evie Lacks, MD  chlorhexidine (PERIDEX) 0.12 % solution Use as directed 5 mLs in the mouth or throat 2 (two) times daily. Pt is to swish and spit. 12/19/16   Plotnikov, Evie Lacks, MD  clobetasol ointment (TEMOVATE) 0.05 % Apply topically. Apply topically two times day 02/24/17   Enriqueta Shutter, MD  clonazePAM Bobbye [place]) 0.5 MG tablet Take 1-2 tablets BY MOUTH at bedtime 01/29/17   Plotnikov, Evie Lacks, MD  doxycycline (VIBRA-TABS) 100 MG tablet Take 1 tablet (100 mg total) by mouth 2 (two) times daily. 04/25/17   Marrian Salvage, FNP  esomeprazole (NEXIUM) 40 MG capsule Take 1 capsule (40 mg total) by mouth daily before breakfast. 01/21/17   Plotnikov, Evie Lacks, MD  furosemide (LASIX) 40 MG tablet TAKE 1 TABLET BY MOUTH EVERY MORNING FOR EDEMA 01/10/17   Plotnikov, Evie Lacks, MD  gabapentin (NEURONTIN) 300 MG capsule Take 1 capsule (300 mg total) by mouth 2 (two) times daily as needed (for pain). 01/10/17   Plotnikov, Evie Lacks, MD  Lancets (FREESTYLE) lancets 1 each. 07/12/14   [provider]  metFORMIN (GLUCOPHAGE-XR) 500 MG 24 hr tablet TAKE 2 TABLETS(1000 MG) BY MOUTH DAILY WITH BREAKFAST 02/27/17   Renato Shin, MD  potassium chloride (KLOR-CON 10) 10 MEQ tablet Take 1 tablet (10 mEq total) by mouth daily. 08/23/16   Plotnikov, Evie Lacks, MD  sitaGLIPtin (JANUVIA) 50 MG tablet Take 1 tablet (50 mg total) by mouth daily. 05/20/17   Renato Shin, MD  SYMBICORT 160-4.5 MCG/ACT inhaler Inhale 2 puffs into the lungs TWICE DAILY 120/4=30 01/29/17   Parrett, Fonnie Mu, NP  topiramate (TOPAMAX) 50 MG tablet Take 1 tablet (50 mg total) by mouth 2 (two) times daily. 03/04/17   Plotnikov, Evie Lacks, MD  venlafaxine XR (EFFEXOR-XR) 75 MG 24 hr capsule Take 1 capsule (75 mg total) by mouth daily with breakfast.  03/04/17   Plotnikov, Evie Lacks, MD  VENTOLIN HFA 108 (90 Base) MCG/ACT inhaler Inhale 2 puffs into the lungs every 6 (six) hours as needed for wheezing or shortness of breath. 200/8=25 12/11/16   Plotnikov, Evie Lacks, MD  VOLTAREN 1 % GEL Apply 2 g topically 2 (two) times daily as needed (for pain). 100/4=25 04/29/17   Plotnikov, Evie Lacks, MD  XARELTO 20 MG TABS tablet Take 1 tablet (20 mg total) by mouth daily. with food 03/03/17   Plotnikov, Evie Lacks, MD  potassium chloride (K-DUR,KLOR-CON) 10 MEQ tablet TAKE 1 TABLET BY MOUTH DAILY 12/15/12 09/13/13  Plotnikov, Evie Lacks, MD                                                                                                                                    Past Surgical History Past Surgical History:  Procedure Laterality Date  . APPENDECTOMY    . arthroscopic knee surgery Right 11-12-04  . AXILLARY ABCESS IRRIGATION AND DEBRIDEMENT  Jul & Aug2012  . CARPAL TUNNEL RELEASE Left 05/23/2014   Procedure: CARPAL TUNNEL RELEASE;  Surgeon: Meredith Pel, MD;  Location: [facility];  Service: Orthopedics;  Laterality: Left;  . cyst removed from top of buttocks  at age 45  .  ENDOMETRIAL ABLATION    . TOTAL KNEE ARTHROPLASTY Right 11/15/2014   Procedure: TOTAL RIGHT KNEE ARTHROPLASTY;  Surgeon: Meredith Pel, MD;  Location: [facility];  Service: Orthopedics;  Laterality: Right;  . TOTAL KNEE ARTHROPLASTY Left 07/13/2015   Procedure: LEFT TOTAL KNEE ARTHROPLASTY;  Surgeon: Meredith Pel, MD;  Location: [facility];  Service: Orthopedics;  Laterality: Left;   Family History Family History  Problem Relation Age of Onset  . Heart disease Mother   . Kidney disease Mother   . Cancer Father        leukemia  . Hypertension Unknown   . Coronary artery disease Unknown        female 1st degree relative <60  . Heart failure Unknown        congestive  . Coronary artery disease Unknown        Female 1st degree relative <50  . Breast cancer Unknown        1st degree relative <50 S  . Breast cancer Sister     Social History Social History   Tobacco Use  . Smoking status: Former Smoker    Packs/day: 0.50    Years: 10.00    Pack years: 5.00    Types: Cigarettes  . Smokeless tobacco: Never Used  . Tobacco comment: quit smoking in 2004  Substance Use Topics  . Alcohol use: No  . Drug use: No   Allergies Belsomra [suvorexant]; Enalapril maleate; Hydrochlorothiazide; Hydroxychloroquine sulfate; Iodine; Latex; and Nickel  Review of Systems Review of Systems All other systems are reviewed and are negative for acute change except as noted in the HPI  Physical Exam Vital Signs  I have reviewed the triage vital signs BP (!) 112/54 (BP Location: Left Arm)   Pulse 71   Temp 98 F (36.7 C) (Axillary)   Resp 18   Ht 5\' 7"  (1.702 m)   Wt 125.2 kg (276 lb)   SpO2 96%   BMI 43.23 kg/m   Physical Exam  Constitutional: She is oriented to person, place, and time. She appears well-developed and well-nourished. No distress.  HENT:  Head: Normocephalic and atraumatic. Head is with right periorbital erythema (no proptosis. EOMI. (see image regarding erythema)).  Nose:  Right sinus exhibits maxillary sinus tenderness and frontal sinus tenderness. Left sinus exhibits maxillary sinus tenderness and frontal sinus tenderness.  Thrush noted to tongue and posterior oropharynx.  Eyes: Pupils are equal, round, and reactive to light. Conjunctivae and EOM are normal. Right eye exhibits no discharge. Left eye exhibits no discharge. No scleral icterus.  Neck: Normal range of motion. Neck supple.  Cardiovascular: Normal rate and regular rhythm. Exam reveals no gallop and no friction rub.  No murmur heard. Pulmonary/Chest: Effort normal and breath sounds normal. No stridor. No respiratory distress. She has no rales.  Abdominal: Soft. She exhibits no distension. There is no tenderness.  Musculoskeletal: She exhibits no edema or tenderness.  Neurological: She is alert and oriented to person, place, and time.  Skin: Skin is warm and dry. No rash noted. She is not diaphoretic. No erythema.  Psychiatric: She has a normal mood and affect.  Vitals reviewed.     ED Results and Treatments Labs (all labs ordered are listed, but only abnormal results are displayed) Labs Reviewed  COMPREHENSIVE METABOLIC PANEL - Abnormal; Notable for the following components:      Result Value   CO2 21 (*)    Glucose, Bld 121 (*)    Creatinine, Ser 1.19 (*)    Albumin 3.0 (*)    GFR calc non Af Amer 50 (*)    GFR calc Af Amer 58 (*)    All other components within normal limits  CBC WITH DIFFERENTIAL/PLATELET - Abnormal; Notable for the following components:   Hemoglobin 11.2 (*)    MCH 25.3 (*)    MCHC 29.6 (*)    RDW 18.9 (*)    All other components within normal limits  URINALYSIS, ROUTINE W REFLEX MICROSCOPIC - Abnormal; Notable for the following components:   Color, Urine AMBER (*)    APPearance CLOUDY (*)    Specific Gravity, Urine 1.038 (*)    Bilirubin Urine MODERATE (*)    Ketones, ur 5 (*)    Protein, ur 30 (*)    Bacteria, UA MANY (*)    All other components within  normal limits  CULTURE, BLOOD (ROUTINE X 2)  CULTURE, BLOOD (ROUTINE X 2)  I-STAT CG4 LACTIC ACID, ED  I-STAT CG4 LACTIC ACID, ED  EKG  EKG Interpretation  Date/Time:    Ventricular Rate:    PR Interval:    QRS Duration:   QT Interval:    QTC Calculation:   R Axis:     Text Interpretation:        Radiology Dg Chest 2 View  Result Date: 05/23/2017 CLINICAL DATA:  Facial cellulitis EXAM: CHEST - 2 VIEW COMPARISON:  12/08/2016 chest radiograph. FINDINGS: Stable cardiomediastinal silhouette with normal heart size. No pneumothorax. No pleural effusion. Mild reticulonodular opacities at the right greater than left lung apices, not appreciably changed. Mild reticular opacities at the right lung base, unchanged. No pulmonary edema. No acute consolidative airspace disease. IMPRESSION: No acute cardiopulmonary disease. Stable biapical reticulonodular and right basilar reticular opacities, attributed to sarcoidosis on 07/21/2016 chest CT. Electronically Signed   By: Ilona Sorrel M.D.   On: 05/23/2017 10:12   Pertinent labs & imaging results that were available during my care of the patient were reviewed by me and considered in my medical decision making (see chart for details).  Medications Ordered in ED Medications  piperacillin-tazobactam (ZOSYN) IVPB 3.375 g (3.375 g Intravenous New Bag/Given 05/23/17 2310)  vancomycin (VANCOCIN) IVPB 1000 mg/200 mL premix (has no administration in time range)  clobetasol ointment (TEMOVATE) 0.05 % ( Topical Not Given 05/23/17 2344)  clonazePAM (KLONOPIN) tablet 0.5-1 mg (1 mg Oral Given 05/23/17 2338)  pantoprazole (PROTONIX) EC tablet 40 mg (40 mg Oral Not Given 05/23/17 2339)  furosemide (LASIX) tablet 40 mg (has no administration in time range)  gabapentin (NEURONTIN) capsule 300 mg (has no administration in time range)  potassium  chloride (K-DUR) CR tablet 10 mEq (10 mEq Oral Not Given 05/23/17 2338)  linagliptin (TRADJENTA) tablet 5 mg (has no administration in time range)  mometasone-formoterol (DULERA) 200-5 MCG/ACT inhaler 2 puff (2 puffs Inhalation Not Given 05/23/17 2030)  topiramate (TOPAMAX) tablet 50 mg (50 mg Oral Given 05/23/17 2339)  venlafaxine XR (EFFEXOR-XR) 24 hr capsule 75 mg (has no administration in time range)  albuterol (PROVENTIL) (2.5 MG/3ML) 0.083% nebulizer solution 3 mL (has no administration in time range)  acetaminophen (TYLENOL) tablet 650 mg (has no administration in time range)    Or  acetaminophen (TYLENOL) suppository 650 mg (has no administration in time range)  ondansetron (ZOFRAN) tablet 4 mg (has no administration in time range)    Or  ondansetron (ZOFRAN) injection 4 mg (has no administration in time range)  insulin aspart (novoLOG) injection 0-9 Units (has no administration in time range)  morphine 4 MG/ML injection 1 mg (1 mg Intravenous Given 05/23/17 2346)  heparin ADULT infusion 100 units/mL (25000 units/217mL sodium chloride 0.45%) (1,450 Units/hr Intravenous New Bag/Given 05/23/17 2307)  carvedilol (COREG) tablet 12.5 mg (12.5 mg Oral Given 05/23/17 2344)  0.9 %  sodium chloride infusion ( Intravenous New Bag/Given 05/23/17 1728)  piperacillin-tazobactam (ZOSYN) IVPB 3.375 g (0 g Intravenous Stopped 05/23/17 1750)  vancomycin (VANCOCIN) 2,000 mg in sodium chloride 0.9 % 500 mL IVPB (0 mg Intravenous Paused 05/23/17 1858)  iohexol (OMNIPAQUE) 300 MG/ML solution 100 mL (100 mLs Intravenous Contrast Given 05/23/17 1802)  fentaNYL (SUBLIMAZE) injection 50 mcg (50 mcg Intravenous Given 05/23/17 2014)  diphenhydrAMINE (BENADRYL) injection 25 mg (25 mg Intravenous Given 05/23/17 1925)  Procedures Procedures CRITICAL CARE Performed by: Grayce Sessions Ashaunti Treptow Total  critical care time: 30 minutes Critical care time was exclusive of separately billable procedures and treating other patients. Critical care was necessary to treat or prevent imminent or life-threatening deterioration. Critical care was time spent personally by me on the following activities: development of treatment plan with patient and/or surrogate as well as nursing, discussions with consultants, evaluation of patient's response to treatment, examination of patient, obtaining history from patient or surrogate, ordering and performing treatments and interventions, ordering and review of laboratory studies, ordering and review of radiographic studies, pulse oximetry and re-evaluation of patient's condition.   (including critical care time)  Medical Decision Making / ED Course I have reviewed the nursing notes for this encounter and the patient's prior records (if available in EHR or on provided paperwork).    Presentation is consistent with preorbital suppurative cellulitis. Patient with evidence of thrush, but no mucor. Recent sinusitis. Will obtain CT to rule out orbital cellulitis. Will also get CT soft tissue neck.  CT confirmed preseptal cellulitis secondary to tach or cystitis.  Given the patient's immunosuppression, will admit her for IV antibiotics.  Ophthalmology consulted who recommended warm compresses and outpatient follow-up after IV antibiotic treatment.   Final Clinical Impression(s) / ED Diagnoses Final diagnoses:  Preseptal cellulitis of right eye  Dacryocystitis of right lacrimal sac      This chart was dictated using voice recognition software.  Despite best efforts to proofread,  errors can occur which can change the documentation meaning.   Fatima Blank, MD 05/24/17 [id number]

## 2017-05-23 NOTE — ED Notes (Signed)
Pt started to c.o itching in her scalp and around her neck. Vanc paused and Dr Leonette Monarch made aware

## 2017-05-23 NOTE — Progress Notes (Signed)
Subjective:  Patient ID: [name], female    DOB: 1960-10-08  Age: 57 y.o. MRN: [id number]  CC: No chief complaint on file.   HPI [name] presents for eye pain and redness since Sunday. It got back on Wed. She had inflammation around the R eye and sinusitis 1 mo ago. She was given Doxy but developed nausea and stopped the Rx. C/o severe pain in the R eye and head. Stayed in bed x 4 days. Not eating - lost wt.  Outpatient Medications Prior to Visit  Medication Sig Dispense Refill  . Adalimumab (HUMIRA) 40 MG/0.8ML PSKT Inject 40 mg into the skin every 14 (fourteen) days.    . carvedilol (COREG) 12.5 MG tablet TAKE 1 TABLET BY MOUTH TWICE DAILY WITH A MEAL 180 tablet 1  . chlorhexidine (PERIDEX) 0.12 % solution Use as directed 5 mLs in the mouth or throat 2 (two) times daily. Pt is to swish and spit. 120 mL 0  . clobetasol ointment (TEMOVATE) 0.05 % Apply topically. Apply topically two times day    . clonazePAM (KLONOPIN) 0.5 MG tablet Take 1-2 tablets BY MOUTH at bedtime 60 tablet 2  . doxycycline (VIBRA-TABS) 100 MG tablet Take 1 tablet (100 mg total) by mouth 2 (two) times daily. 20 tablet 0  . esomeprazole (NEXIUM) 40 MG capsule Take 1 capsule (40 mg total) by mouth daily before breakfast. 90 capsule 3  . furosemide (LASIX) 40 MG tablet TAKE 1 TABLET BY MOUTH EVERY MORNING FOR EDEMA 90 tablet 1  . gabapentin (NEURONTIN) 300 MG capsule Take 1 capsule (300 mg total) by mouth 2 (two) times daily as needed (for pain). 180 capsule 1  . Lancets (FREESTYLE) lancets 1 each.    . metFORMIN (GLUCOPHAGE-XR) 500 MG 24 hr tablet TAKE 2 TABLETS(1000 MG) BY MOUTH DAILY WITH BREAKFAST 180 tablet 0  . potassium chloride (KLOR-CON 10) 10 MEQ tablet Take 1 tablet (10 mEq total) by mouth daily. 90 tablet 3  . sitaGLIPtin (JANUVIA) 50 MG tablet Take 1 tablet (50 mg total) by mouth daily. 90 tablet 0  . SYMBICORT 160-4.5 MCG/ACT inhaler Inhale 2 puffs into the lungs TWICE DAILY  120/4=30 10.2 g 5  . topiramate (TOPAMAX) 50 MG tablet Take 1 tablet (50 mg total) by mouth 2 (two) times daily. 180 tablet 1  . venlafaxine XR (EFFEXOR-XR) 75 MG 24 hr capsule Take 1 capsule (75 mg total) by mouth daily with breakfast. 30 capsule 11  . VENTOLIN HFA 108 (90 Base) MCG/ACT inhaler Inhale 2 puffs into the lungs every 6 (six) hours as needed for wheezing or shortness of breath. 200/8=25 18 g 11  . VOLTAREN 1 % GEL Apply 2 g topically 2 (two) times daily as needed (for pain). 100/4=25 100 g 2  . XARELTO 20 MG TABS tablet Take 1 tablet (20 mg total) by mouth daily. with food 90 tablet 0   No facility-administered medications prior to visit.     ROS Review of Systems  Constitutional: Positive for chills, fatigue and fever. Negative for activity change, appetite change and unexpected weight change.  HENT: Negative for congestion, mouth sores and sinus pressure.   Eyes: Negative for visual disturbance.  Respiratory: Negative for cough and chest tightness.   Gastrointestinal: Negative for abdominal pain and nausea.  Genitourinary: Negative for difficulty urinating, frequency and vaginal pain.  Musculoskeletal: Positive for back pain. Negative for gait problem.  Skin: Positive for color change and rash. Negative for pallor.  Neurological:  Negative for dizziness, tremors, weakness, numbness and headaches.  Psychiatric/Behavioral: Negative for confusion and sleep disturbance.    Objective:  BP 136/78 (BP Location: Right Arm, Patient Position: Sitting, Cuff Size: Large)   Pulse 71   Temp 98.1 F (36.7 C) (Axillary)   Ht 5\' 7"  (1.702 m)   Wt 276 lb (125.2 kg)   SpO2 99% Comment: 2 liters of o2  BMI 43.23 kg/m   BP Readings from Last 3 Encounters:  05/23/17 136/78  04/25/17 (!) 142/82  04/02/17 132/86    Wt Readings from Last 3 Encounters:  05/23/17 276 lb (125.2 kg)  04/25/17 286 lb 1.3 oz (129.8 kg)  04/29/17 287 lb 0.6 oz (130.2 kg)    Physical Exam    Constitutional: She appears well-developed. No distress.  HENT:  Head: Normocephalic.  Right Ear: External ear normal.  Left Ear: External ear normal.  Nose: Nose normal.  Mouth/Throat: Oropharynx is clear and moist.  Eyes: Pupils are equal, round, and reactive to light. Conjunctivae are normal. Right eye exhibits no discharge. Left eye exhibits no discharge.  Neck: Normal range of motion. Neck supple. No JVD present. No tracheal deviation present. No thyromegaly present.  Cardiovascular: Normal rate, regular rhythm and normal heart sounds.  Pulmonary/Chest: No stridor. No respiratory distress. She has no wheezes.  Abdominal: Soft. Bowel sounds are normal. She exhibits no distension and no mass. There is no tenderness. There is no rebound and no guarding.  Musculoskeletal: She exhibits no edema or tenderness.  Lymphadenopathy:    She has no cervical adenopathy.  Neurological: She displays normal reflexes. No cranial nerve deficit. She exhibits normal muscle tone. Coordination normal.  Skin: Rash noted. There is erythema.  Psychiatric: Her behavior is normal. Judgment and thought content normal.  R eye is shut w/swelling and erythema.There is a drainage of pus out of the ?abscess tract medially - yellow pus Tearful Obese walker  Lab Results  Component Value Date   WBC 8.7 12/08/2016   HGB 11.2 (L) 12/08/2016   HCT 38.1 12/08/2016   PLT 223 12/08/2016   GLUCOSE 142 (H) 12/08/2016   CHOL 193 06/29/2008   TRIG 145.0 06/29/2008   HDL 67.50 06/29/2008   LDLCALC 97 06/29/2008   ALT 15 08/23/2016   AST 16 08/23/2016   NA 133 (L) 12/08/2016   K 3.3 (L) 12/08/2016   CL 101 12/08/2016   CREATININE 1.14 (H) 12/08/2016   BUN 13 12/08/2016   CO2 21 (L) 12/08/2016   TSH 2.31 08/23/2016   INR 1.07 07/22/2016   HGBA1C 6.7 (H) 04/02/2017   MICROALBUR <0.7 02/01/2015    No results found.  Assessment & Plan:   There are no diagnoses linked to this encounter. I am having [name] maintain her Adalimumab, potassium chloride, VENTOLIN HFA, chlorhexidine, carvedilol, furosemide, gabapentin, esomeprazole, SYMBICORT, clonazePAM, metFORMIN, clobetasol ointment, topiramate, venlafaxine XR, XARELTO, freestyle, doxycycline, VOLTAREN, and sitaGLIPtin.  No orders of the defined types were placed in this encounter.    Follow-up: No follow-ups on file.  Walker Kehr, MD

## 2017-05-23 NOTE — Progress Notes (Signed)
Pharmacy Antibiotic Note  SHAVELLE RUNKEL is a 57 y.o. female admitted on 05/23/2017 with cellulitis.   Plan: Zosyn 3.375 gm iv q 8h Vanc 2 g x 1 then 1 g q12h Monitor renal fx cx vt prn  Height: 5\' 7"  (170.2 cm) Weight: 276 lb (125.2 kg) IBW/kg (Calculated) : 61.6  Temp (24hrs), Avg:98.1 F (36.7 C), Min:98 F (36.7 C), Max:98.2 F (36.8 C)  Recent Labs  Lab 05/23/17 0910 05/23/17 0920  WBC 10.4  --   CREATININE 1.19*  --   LATICACIDVEN  --  1.64    Estimated Creatinine Clearance: 72.5 mL/min (A) (by C-G formula based on SCr of 1.19 mg/dL (H)).    Allergies  Allergen Reactions  . Belsomra [Suvorexant]     falling out of bed in the sleep: "I'm waking up as I'm falling on the floor"   . Enalapril Maleate Cough  . Hydrochlorothiazide Other (See Comments)    Reaction:  Low potassium levels   . Hydroxychloroquine Sulfate Other (See Comments)    Reaction:  Vision changes   . Iodine   . Latex Hives  . Nickel Other (See Comments)    Reaction:  Blisters    Levester Fresh, PharmD, BCPS, BCCCP Clinical Pharmacist Clinical phone for 05/23/2017 from [phone]: [phone] If after 2300, please call main pharmacy at: [phone] 05/23/2017 5:15 PM

## 2017-05-23 NOTE — H&P (Signed)
History and Physical    [name] JSE:[id number] DOB: 08-May-1960 DOA: 05/23/2017  PCP: Cassandria Anger, MD  Patient coming from: Home.  Chief Complaint: Right eye increasing discharge and pain.  HPI: [name] is a 57 y.o. female with history of sarcoidosis on Humira, pulmonary embolism on Xarelto, diabetes mellitus type 2, diastolic dysfunction, hypertension, chronic kidney disease stage II has been experiencing increasing pain and discharge over the right eye mostly in the medial aspect over the last 4 days which has been worsening with increasing discharge.  Denies any fever chills.  Had a similar episode around 3 to 4 weeks ago and was treated with doxycycline following which symptoms improved but had some extended period of nausea and vomiting at that time.  Patient went to her PCP today and was referred to the ER.  ED Course: CT maxillofacial was done which shows features concerning for preseptal cellulitis.  Patient has no restriction of her high movement or any visual changes.  Since it was involving the lacrimal sacs nasolacrimal ducts and some septal perforation ER physician had discussed with on-call ENT surgeon Dr. Redmond Baseman who advised continuing antibiotics for now and follow-up as outpatient.  ER physician also had discussed with on-call ophthalmologist who advised antibiotics.  Review of Systems: As per HPI, rest all negative.   Past Medical History:  Diagnosis Date  . Allergic rhinitis   . ALLERGIC RHINITIS 10/26/2009  . Anxiety   . ANXIETY 08/14/2006  . B12 DEFICIENCY 08/25/2007  . Confusion   . Depression    takes Lexapro daily  . Diabetes mellitus without complication ([place])    was on insulin but has been off since Nov 2015 and now only takes Metformin daily  . DYSPNEA 04/28/2009   with exertion  . Esophageal reflux    takes Nexium daily  . Fibromyalgia   . Headache    last migraine 2-63yrs ago;takes Topamax daily  . History of shingles     . Hypertension    takes Coreg daily  . Insomnia    takes Nortriptyline nightly   . Joint pain   . Joint swelling   . Left knee pain   . Lichen planus   . Long-term memory loss   . Nocturia   . OSA (obstructive sleep apnea)    doesn't use CPAP;sleep study in epic from 2006  . Osteoarthritis   . Osteoarthritis   . Pneumonia    over 30 yrs ago  . Protein calorie malnutrition ([place])   . Rheumatoid arthritis ([place])   . Sarcoidosis    Dr. Lolita Patella  . Short-term memory loss   . Shortness of breath   . TIA (transient ischemic attack)   . Unsteady gait   . Urinary urgency   . Vitamin D deficiency    is supposed to take Vit D but can't afford it  . VITAMIN D DEFICIENCY 08/25/2007    Past Surgical History:  Procedure Laterality Date  . APPENDECTOMY    . arthroscopic knee surgery Right 11-12-04  . AXILLARY ABCESS IRRIGATION AND DEBRIDEMENT  Jul & Aug2012  . CARPAL TUNNEL RELEASE Left 05/23/2014   Procedure: CARPAL TUNNEL RELEASE;  Surgeon: Meredith Pel, MD;  Location: [facility];  Service: Orthopedics;  Laterality: Left;  . cyst removed from top of buttocks  at age 89  . ENDOMETRIAL ABLATION    . TOTAL KNEE ARTHROPLASTY Right 11/15/2014   Procedure: TOTAL RIGHT KNEE ARTHROPLASTY;  Surgeon: Meredith Pel, MD;  Location: [facility];  Service: Orthopedics;  Laterality: Right;  . TOTAL KNEE ARTHROPLASTY Left 07/13/2015   Procedure: LEFT TOTAL KNEE ARTHROPLASTY;  Surgeon: Meredith Pel, MD;  Location: [facility];  Service: Orthopedics;  Laterality: Left;     reports that she has quit smoking. Her smoking use included cigarettes. She has a 5.00 pack-year smoking history. She has never used smokeless tobacco. She reports that she does not drink alcohol or use drugs.  Allergies  Allergen Reactions  . Belsomra [Suvorexant]     falling out of bed in the sleep: "I'm waking up as I'm falling on the floor"   . Enalapril Maleate Cough  . Hydrochlorothiazide Other (See Comments)    Reaction:   Low potassium levels   . Hydroxychloroquine Sulfate Other (See Comments)    Reaction:  Vision changes   . Iodine   . Latex Hives  . Nickel Other (See Comments)    Reaction:  Blisters     Family History  Problem Relation Age of Onset  . Heart disease Mother   . Kidney disease Mother   . Cancer Father        leukemia  . Hypertension Unknown   . Coronary artery disease Unknown        female 1st degree relative <60  . Heart failure Unknown        congestive  . Coronary artery disease Unknown        Female 1st degree relative <50  . Breast cancer Unknown        1st degree relative <50 S  . Breast cancer Sister     Prior to Admission medications   Medication Sig Start Date End Date Taking? Authorizing Provider  Adalimumab (HUMIRA) 40 MG/0.8ML PSKT Inject 40 mg into the skin every 14 (fourteen) days.    [provider]  carvedilol (COREG) 12.5 MG tablet TAKE 1 TABLET BY MOUTH TWICE DAILY WITH A MEAL 01/10/17   Plotnikov, Evie Lacks, MD  chlorhexidine (PERIDEX) 0.12 % solution Use as directed 5 mLs in the mouth or throat 2 (two) times daily. Pt is to swish and spit. 12/19/16   Plotnikov, Evie Lacks, MD  clobetasol ointment (TEMOVATE) 0.05 % Apply topically. Apply topically two times day 02/24/17   Enriqueta Shutter, MD  clonazePAM [place]bye [place]) 0.5 MG tablet Take 1-2 tablets BY MOUTH at bedtime 01/29/17   Plotnikov, Evie Lacks, MD  doxycycline (VIBRA-TABS) 100 MG tablet Take 1 tablet (100 mg total) by mouth 2 (two) times daily. 04/25/17   Marrian Salvage, FNP  esomeprazole (NEXIUM) 40 MG capsule Take 1 capsule (40 mg total) by mouth daily before breakfast. 01/21/17   Plotnikov, Evie Lacks, MD  furosemide (LASIX) 40 MG tablet TAKE 1 TABLET BY MOUTH EVERY MORNING FOR EDEMA 01/10/17   Plotnikov, Evie Lacks, MD  gabapentin (NEURONTIN) 300 MG capsule Take 1 capsule (300 mg total) by mouth 2 (two) times daily as needed (for pain). 01/10/17   Plotnikov, Evie Lacks, MD  Lancets (FREESTYLE)  lancets 1 each. 07/12/14   [provider]  metFORMIN (GLUCOPHAGE-XR) 500 MG 24 hr tablet TAKE 2 TABLETS(1000 MG) BY MOUTH DAILY WITH BREAKFAST 02/27/17   Renato Shin, MD  potassium chloride (KLOR-CON 10) 10 MEQ tablet Take 1 tablet (10 mEq total) by mouth daily. 08/23/16   Plotnikov, Evie Lacks, MD  sitaGLIPtin (JANUVIA) 50 MG tablet Take 1 tablet (50 mg total) by mouth daily. 05/20/17   Renato Shin, MD  SYMBICORT 160-4.5 MCG/ACT inhaler Inhale  2 puffs into the lungs TWICE DAILY 120/4=30 01/29/17   Parrett, Fonnie Mu, NP  topiramate (TOPAMAX) 50 MG tablet Take 1 tablet (50 mg total) by mouth 2 (two) times daily. 03/04/17   Plotnikov, Evie Lacks, MD  venlafaxine XR (EFFEXOR-XR) 75 MG 24 hr capsule Take 1 capsule (75 mg total) by mouth daily with breakfast. 03/04/17   Plotnikov, Evie Lacks, MD  VENTOLIN HFA 108 (90 Base) MCG/ACT inhaler Inhale 2 puffs into the lungs every 6 (six) hours as needed for wheezing or shortness of breath. 200/8=25 12/11/16   Plotnikov, Evie Lacks, MD  VOLTAREN 1 % GEL Apply 2 g topically 2 (two) times daily as needed (for pain). 100/4=25 04/29/17   Plotnikov, Evie Lacks, MD  XARELTO 20 MG TABS tablet Take 1 tablet (20 mg total) by mouth daily. with food 03/03/17   Plotnikov, Evie Lacks, MD  potassium chloride (K-DUR,KLOR-CON) 10 MEQ tablet TAKE 1 TABLET BY MOUTH DAILY 12/15/12 09/13/13  Plotnikov, Evie Lacks, MD    Physical Exam: Vitals:   05/23/17 0848 05/23/17 0850 05/23/17 1305  BP: 114/66  (!) 112/54  Pulse: 71  71  Resp: 16  18  Temp: 98.2 F (36.8 C)  98 F (36.7 C)  TempSrc: Axillary  Axillary  SpO2: 100%  96%  Weight:  125.2 kg (276 lb)   Height:  5\' 7"  (1.702 m)       Constitutional: Moderately built and nourished. Vitals:   05/23/17 0848 05/23/17 0850 05/23/17 1305  BP: 114/66  (!) 112/54  Pulse: 71  71  Resp: 16  18  Temp: 98.2 F (36.8 C)  98 F (36.7 C)  TempSrc: Axillary  Axillary  SpO2: 100%  96%  Weight:  125.2 kg (276 lb)   Height:  5\' 7"   (1.702 m)    Eyes: Anicteric no pallor.  Right periorbital area is swollen erythematous with no restriction of eye movement mild discharge from the medial canthus tender to touch.  No discharge or swelling in the left eye. ENMT: No discharge from the ears eyes nose or mouth. Neck: No neck rigidity no mass felt. Respiratory: No rhonchi or crepitations. Cardiovascular: S1-S2 heard no murmurs appreciated. Abdomen: Soft nontender bowel sounds present. Musculoskeletal: No edema.  No effusion. Skin: Skin around the right eye is erythematous. Neurologic: Alert awake oriented to time place and person.  Moves all extremities. Psychiatric: Appears normal.  Normal affect.   Labs on Admission: I have personally reviewed following labs and imaging studies  CBC: Recent Labs  Lab 05/23/17 0910  WBC 10.4  NEUTROABS 6.5  HGB 11.2*  HCT 37.8  MCV 85.3  PLT 629   Basic Metabolic Panel: Recent Labs  Lab 05/23/17 0910  NA 141  K 3.9  CL 108  CO2 21*  GLUCOSE 121*  BUN 13  CREATININE 1.19*  CALCIUM 9.1   GFR: Estimated Creatinine Clearance: 72.5 mL/min (A) (by C-G formula based on SCr of 1.19 mg/dL (H)). Liver Function Tests: Recent Labs  Lab 05/23/17 0910  AST 27  ALT 19  ALKPHOS 105  BILITOT 0.5  PROT 7.2  ALBUMIN 3.0*   No results for input(s): LIPASE, AMYLASE in the last 168 hours. No results for input(s): AMMONIA in the last 168 hours. Coagulation Profile: No results for input(s): INR, PROTIME in the last 168 hours. Cardiac Enzymes: No results for input(s): CKTOTAL, CKMB, CKMBINDEX, TROPONINI in the last 168 hours. BNP (last 3 results) Recent Labs    11/11/16 1051  PROBNP 194.0*  HbA1C: No results for input(s): HGBA1C in the last 72 hours. CBG: No results for input(s): GLUCAP in the last 168 hours. Lipid Profile: No results for input(s): CHOL, HDL, LDLCALC, TRIG, CHOLHDL, LDLDIRECT in the last 72 hours. Thyroid Function Tests: No results for input(s): TSH,  T4TOTAL, FREET4, T3FREE, THYROIDAB in the last 72 hours. Anemia Panel: No results for input(s): VITAMINB12, FOLATE, FERRITIN, TIBC, IRON, RETICCTPCT in the last 72 hours. Urine analysis:    Component Value Date/Time   COLORURINE AMBER (A) 05/23/2017 0859   APPEARANCEUR CLOUDY (A) 05/23/2017 0859   LABSPEC 1.038 (H) 05/23/2017 0859   PHURINE 5.0 05/23/2017 0859   GLUCOSEU NEGATIVE 05/23/2017 0859   GLUCOSEU NEGATIVE 04/27/2013 1218   HGBUR NEGATIVE 05/23/2017 0859   BILIRUBINUR MODERATE (A) 05/23/2017 0859   KETONESUR 5 (A) 05/23/2017 0859   PROTEINUR 30 (A) 05/23/2017 0859   UROBILINOGEN 1.0 11/08/2014 1050   NITRITE NEGATIVE 05/23/2017 0859   LEUKOCYTESUR NEGATIVE 05/23/2017 0859   Sepsis Labs: @LABRCNTIP (procalcitonin:4,lacticidven:4) )No results found for this or any previous visit (from the past 240 hour(s)).   Radiological Exams on Admission: Dg Chest 2 View  Result Date: 05/23/2017 CLINICAL DATA:  Facial cellulitis EXAM: CHEST - 2 VIEW COMPARISON:  12/08/2016 chest radiograph. FINDINGS: Stable cardiomediastinal silhouette with normal heart size. No pneumothorax. No pleural effusion. Mild reticulonodular opacities at the right greater than left lung apices, not appreciably changed. Mild reticular opacities at the right lung base, unchanged. No pulmonary edema. No acute consolidative airspace disease. IMPRESSION: No acute cardiopulmonary disease. Stable biapical reticulonodular and right basilar reticular opacities, attributed to sarcoidosis on 07/21/2016 chest CT. Electronically Signed   By: Ilona Sorrel M.D.   On: 05/23/2017 10:12   Ct Soft Tissue Neck W Contrast  Result Date: 05/23/2017 CLINICAL DATA:  57 year old female with right orbit face and neck cellulitis. Sarcoidosis. EXAM: CT NECK WITH CONTRAST TECHNIQUE: Multidetector CT imaging of the neck was performed using the standard protocol following the bolus administration of intravenous contrast. CONTRAST:  144mL OMNIPAQUE  IOHEXOL 300 MG/ML  SOLN COMPARISON:  Brain MRI 05/07/2016. Head CT without contrast 05/06/2013. Cervical spine CT without contrast 03/07/2009. chest CTA 07/21/2016. FINDINGS: Pharynx and larynx: Larynx soft tissue contours are within normal limits. The hypopharynx is within normal limits. At the soft palate and nasopharynx there is widespread uniform pharyngeal mucosal space hyperenhancement (series 3, image 50). Retained secretions in the nasopharynx. Negative parapharyngeal spaces. The retropharyngeal space is remarkable for mildly enlarged bilateral retropharyngeal lymph nodes measuring 7-9 millimeters in thickness (series 3, images 54 and 50), as well as a retropharyngeal course of the right carotid. No retropharyngeal fluid. Salivary glands: Negative sublingual space. Submandibular glands and parotid glands are within normal limits. Thyroid: Mild thyromegaly.  No discrete thyroid nodule or mass. Lymph nodes: Enlarged retropharyngeal lymph nodes are described above. The other lymph node spaces throughout the neck appear normal. No cystic or necrotic nodes. Vascular: The major vascular structures in the neck and at the skull base are patent. There is a retropharyngeal course of the right carotid. The left carotid has much less of a retropharyngeal course today versus that seen on the 2011 CT. Calcified atherosclerosis at the skull base. Limited intracranial: Negative visualized brain parenchyma. Visualized orbits: There is confluent abnormal soft tissue thickening in the right preseptal space, especially within the medial aspect of the orbit. Centrally within the abnormal preseptal soft tissue there is tubular hypodensity which appears contiguous with the right lacrimal sac and nasolacrimal duct (series 3, image  30 and coronal series 6 images 29 through 35). Furthermore, there was a small simple appearing cystic lesion at the right lacrimal sac on the 2018 MRI (9-10 millimeters at that time, series 5, image 9 of  that study). The postseptal orbit soft tissues remain normal. Inflammation tracks into the right premalar space. The left orbit is normal. Mastoids and visualized paranasal sinuses: There is a perforated anterior nasal septum. This was not evident in 2018. There are retained secretions throughout the nasal cavity, greater on the left, and continuing into the nasopharynx. There is only trace paranasal sinus mucosal thickening, primarily within the right maxillary sinus. The bilateral tympanic cavities are clear. There are mild bilateral mastoid effusions which appears stable since 2018. Skeleton: Absent maxillary and posterior mandible dentition. The facial bones appear stable and intact. Mild for age cervical spine degeneration. No acute osseous abnormality identified. Upper chest: Lymph nodes there are scattered calcified mediastinal. There is partially visible bilateral pulmonary perihilar opacity. The visible upper chest appears stable since 2018. IMPRESSION: 1. Right preseptal soft tissue thickening and/or inflammation with low-density material contiguous with the right nasolacrimal duct. This is suggestive of a complicated Acute Infectious Dacryocystitis with preseptal cellulitis. However, there was a small cystic lesion of the right lacrimal sac in 2018, and rarely sarcoidosis can directly involve the nasolacrimal duct. See also #2. No postseptal involvement. 2. Anterior nasal septal perforation which may be new or progressed from the prior studies. Superimposed acute inflammation of the nasal cavity and mucositis type appearance of the nasopharynx with associated mild retropharyngeal lymphadenopathy which appears reactive. Again, top considerations are Acute Infection and Sarcoidosis. 3. Negative neck soft tissues otherwise. 4. Stable chronic sarcoidosis sequelae in the visible upper chest. Electronically Signed   By: Genevie Ann M.D.   On: 05/23/2017 19:35     Assessment/Plan Principal Problem:   Preseptal  cellulitis Active Problems:   Essential hypertension   Diabetes mellitus type 2 with complications (HCC)   Pulmonary embolism ([facility])   OSA (obstructive sleep apnea)   Cellulitis    1. Preseptal cellulitis with dacryocystitis, anterior nasal septal perforation superimposed with acute inflammation of the nasal cavity and mucositis -at this time patient's images were reviewed by ENT surgeon Dr. Redmond Baseman and also ER physician I discussed with on-call ophthalmologist who advised antibiotics.  Patient has been placed on vancomycin and Zosyn.  Patient did have red man reaction after receiving vancomycin which improved with Benadryl and is advised to administer vancomycin slowly and give Benadryl before each dose. 2. History of pulmonary embolism on Xarelto which we will hold and keep on heparin in anticipation if in case patient needs procedure. 3. Diabetes mellitus type 2 -we will continue Januvia hold metformin keep patient on sliding scale coverage. 4. History of sleep apnea patient does not use CPAP. 5. History of asthma on inhalers. 6. Hypertension on Coreg. 7. History of diastolic dysfunction last EF measured was 60 to 65% in November 2018.  On Lasix.  Appears compensated. 8. Sarcoidosis being treated by dermatologist presently on Humira. 9. Chronic kidney disease stage II creatinine appears to be at baseline.  Note that patient is on Lasix.  Closely follow metabolic panel.   DVT prophylaxis: Heparin infusion. Code Status: Full code. Family Communication: Discussed with patient. Disposition Plan: Home. Consults called: ER physician discussed with ENT surgeon and ophthalmologist. Admission status: Inpatient.   Rise Patience MD [facility] Pager [phone].  If 7PM-7AM, please contact night-coverage [email or web address] Password [email or web address]  05/23/2017, 8:25  PM

## 2017-05-23 NOTE — ED Triage Notes (Signed)
Cellulitis to right side of her face and eye onset Sunday was seen by her PCP,

## 2017-05-24 ENCOUNTER — Other Ambulatory Visit: Payer: Self-pay

## 2017-05-24 LAB — BASIC METABOLIC PANEL
ANION GAP: 9 (ref 5–15)
BUN: 12 mg/dL (ref 6–20)
CO2: 21 mmol/L — ABNORMAL LOW (ref 22–32)
Calcium: 8.5 mg/dL — ABNORMAL LOW (ref 8.9–10.3)
Chloride: 109 mmol/L (ref 101–111)
Creatinine, Ser: 1.04 mg/dL — ABNORMAL HIGH (ref 0.44–1.00)
GFR calc Af Amer: >60 mL/min (ref >60)
GFR, EST NON AFRICAN AMERICAN: 59 mL/min — AB (ref >60)
Glucose, Bld: 120 mg/dL — ABNORMAL HIGH (ref 65–99)
POTASSIUM: 4.6 mmol/L (ref 3.5–5.1)
SODIUM: 139 mmol/L (ref 135–145)

## 2017-05-24 LAB — CBC
HEMATOCRIT: 37.1 % (ref 36.0–46.0)
Hemoglobin: 11 g/dL — ABNORMAL LOW (ref 12.0–15.0)
MCH: 25.4 pg — ABNORMAL LOW (ref 26.0–34.0)
MCHC: 29.6 g/dL — ABNORMAL LOW (ref 30.0–36.0)
MCV: 85.7 fL (ref 78.0–100.0)
Platelets: 159 10*3/uL (ref 150–400)
RBC: 4.33 MIL/uL (ref 3.87–5.11)
RDW: 19.1 % — ABNORMAL HIGH (ref 11.5–15.5)
WBC: 8.3 10*3/uL (ref 4.0–10.5)

## 2017-05-24 LAB — GLUCOSE, CAPILLARY
GLUCOSE-CAPILLARY: 114 mg/dL — AB (ref 65–99)
Glucose-Capillary: 113 mg/dL — ABNORMAL HIGH (ref 65–99)
Glucose-Capillary: 129 mg/dL — ABNORMAL HIGH (ref 65–99)
Glucose-Capillary: 88 mg/dL (ref 65–99)

## 2017-05-24 LAB — HEPARIN LEVEL (UNFRACTIONATED): HEPARIN UNFRACTIONATED: 1.56 [IU]/mL — AB (ref 0.30–0.70)

## 2017-05-24 LAB — APTT
aPTT: 32 seconds (ref 24–36)
aPTT: 138 seconds — ABNORMAL HIGH (ref 24–36)
aPTT: 147 seconds — ABNORMAL HIGH (ref 24–36)

## 2017-05-24 MED ORDER — HEPARIN (PORCINE) IN NACL 100-0.45 UNIT/ML-% IJ SOLN
1150.0000 [IU]/h | INTRAMUSCULAR | Status: DC
Start: 2017-05-24 — End: 2017-05-26
  Administered 2017-05-24: 1350 [IU]/h via INTRAVENOUS
  Administered 2017-05-25: 1250 [IU]/h via INTRAVENOUS
  Filled 2017-05-24 (×2): qty 250

## 2017-05-24 NOTE — Progress Notes (Signed)
ANTICOAGULATION CONSULT NOTE  Pharmacy Consult for heparin Indication: pulmonary embolus  Patient Measurements: Height: 5\' 7"  (170.2 cm) Weight: 276 lb 1.6 oz (125.2 kg) IBW/kg (Calculated) : 61.6 Heparin Dosing Weight: 92 kg  Vital Signs: Temp: 98.3 F (36.8 C) (05/11 1729) Temp Source: Oral (05/11 1729) BP: 136/73 (05/11 1729) Pulse Rate: 69 (05/11 1729)  Labs: Recent Labs    05/23/17 0910 05/24/17 0344 05/24/17 1116 05/24/17 1836  HGB 11.2* 11.0*  --   --   HCT 37.8 37.1  --   --   PLT 224 159  --   --   APTT  --  32 138* 147*  HEPARINUNFRC  --  1.56*  --   --   CREATININE 1.19* 1.04*  --   --     Estimated Creatinine Clearance: 83 mL/min (A) (by C-G formula based on SCr of 1.04 mg/dL (H)).  Assessment: 57 y.o. female with right eye swelling, h/o PE, Xarelto on hold until planned surgery next week. Pharmacy consulted for heparin dosing. Last dose of Xarelto 5/9. APTT remains elevated at 147 despite rate reduction. Per RN, no bleeding noted.   Goal of Therapy:  Heparin level 0.3-0.7 units/ml aPTT 66 - 102 seconds Monitor platelets by anticoagulation protocol: Yes   Plan:  Hold heparin x 1 hour then resume at 1350 unit/hr Check an 8 hr aPTT  Salome Arnt, PharmD, BCPS 05/24/2017 7:41 PM

## 2017-05-24 NOTE — Progress Notes (Signed)
Initial Nutrition Assessment  DOCUMENTATION CODES:   Obesity unspecified  INTERVENTION:  Premier Protein BID, each supplement provides 160 calories and 30 grams of protein  NUTRITION DIAGNOSIS:   Increased nutrient needs related to acute illness as evidenced by estimated needs.  GOAL:   Patient will meet greater than or equal to 90% of their needs  MONITOR:   PO intake, Supplement acceptance, Weight trends, I & O's  REASON FOR ASSESSMENT:   Malnutrition Screening Tool    ASSESSMENT:   [name] is a 57 y.o. female with history of sarcoidosis on Humira, pulmonary embolism on Xarelto, diabetes mellitus type 2, diastolic dysfunction, hypertension, chronic kidney disease stage II has been experiencing increasing pain and discharge over the right eye mostly in the medial aspect over the last 4 days which has been worsening with increasing discharge. Now diagnosed with Preseptal cellulitis with dacryocystitis, anterior nasal septal perforation superimposed with acute inflammation of the nasal cavity and mucositis   RD drawn to patient for positive MST. Spoke with [name] at bedside. She reports that for the last month she has had no appetite, and that her stomach "doesn't feel right," due to the antibiotics started for her cellulitis. This is the 2nd time in the past month she has dealt with this infection. She has been eating toast in the mornings and a small bowl of chicken and rice soup daily for approximately the last month.  Prior to that she reports eating fruit for breakfast and a cooked meal for dinner.  Despite PO intake she reports weight gain. Per chart weight history is unclear. Weight upon admission appears stated, weights prior to this fluctuate between 271 and 287 pounds.  Today she had potatoes, an omelet, and fruit for breakfast. Documented meal completion 100% so far  Unable to diagnose malnutrition at this time. Monitor PO intake and  weights. Patient amenable to consuming premier protein BID.  Labs reviewed Medications reviewed and include:  Insulin, 10K+  NUTRITION - FOCUSED PHYSICAL EXAM:    Most Recent Value  Orbital Region  No depletion  Upper Arm Region  No depletion  Thoracic and Lumbar Region  No depletion  Buccal Region  No depletion  Temple Region  Moderate depletion  Clavicle Bone Region  No depletion  Clavicle and Acromion Bone Region  No depletion  Scapular Bone Region  Mild depletion  Dorsal Hand  No depletion  Patellar Region  No depletion  Anterior Thigh Region  No depletion  Posterior Calf Region  No depletion  Edema (RD Assessment)  Mild       Diet Order:   Diet Order           Diet heart healthy/carb modified Room service appropriate? Yes; Fluid consistency: Thin  Diet effective now          EDUCATION NEEDS:   Not appropriate for education at this time  Skin:  Skin Assessment: Reviewed RN Assessment(rash to leg, perineum, buttocks, thigh)  Last BM:  PTA  Height:   Ht Readings from Last 1 Encounters:  05/23/17 5\' 7"  (1.702 m)    Weight:   Wt Readings from Last 1 Encounters:  05/23/17 276 lb 1.6 oz (125.2 kg)    Ideal Body Weight:  61.36 kg  BMI:  Body mass index is 43.24 kg/m.  Estimated Nutritional Needs:   Kcal:  1800-2100 calories  Protein:  100-120 grams  Fluid:  1.8-2.1L  Satira Anis. Lucciana Head, MS, RD LDN Inpatient Clinical Dietitian Pager [phone]

## 2017-05-24 NOTE — Progress Notes (Signed)
[place] for heparin Indication: pulmonary embolus  Allergies  Allergen Reactions  . Vancomycin Itching, Rash and Other (See Comments)    Patient had an allergic reaction that required Benadryl  . Belsomra [Suvorexant] Other (See Comments)    [place] out of bed while asleep: "I'm waking up as I'm falling on the floor;" "Night terrors"  . Doxycycline Diarrhea and Nausea And Vomiting  . Enalapril Maleate Cough  . Hydrochlorothiazide Other (See Comments)    Low potassium levels   . Hydroxychloroquine Sulfate Other (See Comments)    Vision changes   . Iodine Other (See Comments)    Mother was intolerant--CODED on CT table---pt never tried  . Latex Hives  . Lyrica [Pregabalin] Other (See Comments)    "Made me feel high"  . Nickel Other (See Comments)    Blisters  Pt has a titanium right and left knee - nickel causes skin irritations that form into blisters and sores  . Other Other (See Comments)    Patient has Sarcoidosis and can't tolerate any metals  . Tape Other (See Comments)    Medical tape causes bruising    Patient Measurements: Height: 5\' 7"  (170.2 cm) Weight: 276 lb 1.6 oz (125.2 kg) IBW/kg (Calculated) : 61.6 Heparin Dosing Weight: 92 kg  Vital Signs: Temp: 97.7 F (36.5 C) (05/11 0952) Temp Source: Oral (05/11 0952) BP: 121/63 (05/11 0952) Pulse Rate: 80 (05/11 0952)  Labs: Recent Labs    05/23/17 0910 05/24/17 0344 05/24/17 1116  HGB 11.2* 11.0*  --   HCT 37.8 37.1  --   PLT 224 159  --   APTT  --  32 138*  HEPARINUNFRC  --  1.56*  --   CREATININE 1.19* 1.04*  --     Estimated Creatinine Clearance: 83 mL/min (A) (by C-G formula based on SCr of 1.04 mg/dL (H)).  Assessment: 57 y.o. female with right eye swelling, h/o PE, Xarelto on hold until planned surgery next week. Pharmacy consulted for heparin dosing. Last dose of Xarelto 5/9. aPTT supratherapeutic at 138.  Goal of Therapy:  Heparin level 0.3-0.7  units/ml aPTT 66 - 102 seconds Monitor platelets by anticoagulation protocol: Yes   Plan:  Decrease Heparin 1550 units/hr aPTT in 6 hours Daily HL, aPTT, and CBC  Angus Seller, PharmD Pharmacy Resident Clinical Phone for 05/24/2017 until 3:30pm: [phone] If after 3:30pm, please call main pharmacy at [phone] 05/24/2017 1:25 PM

## 2017-05-24 NOTE — Progress Notes (Signed)
[place] for heparin Indication: pulmonary embolus  Allergies  Allergen Reactions  . Vancomycin Itching, Rash and Other (See Comments)    Patient had an allergic reaction that required Benadryl  . Belsomra [Suvorexant] Other (See Comments)    [place] out of bed while asleep: "I'm waking up as I'm falling on the floor;" "Night terrors"  . Doxycycline Diarrhea and Nausea And Vomiting  . Enalapril Maleate Cough  . Hydrochlorothiazide Other (See Comments)    Low potassium levels   . Hydroxychloroquine Sulfate Other (See Comments)    Vision changes   . Iodine Other (See Comments)    Mother was intolerant--CODED on CT table---pt never tried  . Latex Hives  . Lyrica [Pregabalin] Other (See Comments)    "Made me feel high"  . Nickel Other (See Comments)    Blisters  Pt has a titanium right and left knee - nickel causes skin irritations that form into blisters and sores  . Other Other (See Comments)    Patient has Sarcoidosis and can't tolerate any metals  . Tape Other (See Comments)    Medical tape causes bruising    Patient Measurements: Height: 5\' 7"  (170.2 cm) Weight: 276 lb 1.6 oz (125.2 kg) IBW/kg (Calculated) : 61.6 Heparin Dosing Weight: 92 kg  Vital Signs: Temp: 98.4 F (36.9 C) (05/11 0435) Temp Source: Oral (05/11 0435) BP: 130/68 (05/11 0435) Pulse Rate: 73 (05/11 0435)  Labs: Recent Labs    05/23/17 0910 05/24/17 0344  HGB 11.2* 11.0*  HCT 37.8 37.1  PLT 224 159  APTT  --  32  HEPARINUNFRC  --  1.56*  CREATININE 1.19* 1.04*    Estimated Creatinine Clearance: 83 mL/min (A) (by C-G formula based on SCr of 1.04 mg/dL (H)).  Assessment: 57 y.o. female with right eye swelling, h/o PE, Xarelto on hold, for heparin  Goal of Therapy:  Heparin level 0.3-0.7 units/ml aPTT 66 - 102 seconds Monitor platelets by anticoagulation protocol: Yes   Plan:  Increase Heparin 1650 units/hr APTT in 8 hours  Phillis Knack, PharmD,  BCPS  05/24/2017 5:46 AM

## 2017-05-24 NOTE — Progress Notes (Signed)
@IPLOG @        PROGRESS NOTE                                                                                                                                                                                                             Patient Demographics:    [name], is a 57 y.o. female, DOB - 1960/05/09, HDQ:[id number]  Admit date - 05/23/2017   Admitting Physician Rise Patience, MD  Outpatient Primary MD for the patient is Plotnikov, Evie Lacks, MD  LOS - 1  No chief complaint on file.      Brief Narrative  [name] is a 58 y.o. female with history of sarcoidosis on Humira, pulmonary embolism on Xarelto, diabetes mellitus type 2, diastolic dysfunction, hypertension, chronic kidney disease stage II has been experiencing increasing pain and discharge over the right eye mostly in the medial aspect over the last 4 days which has been worsening with increasing discharge.  Denies any fever chills.  Had a similar episode around 3 to 4 weeks ago and was treated with doxycycline following which symptoms improved, and the ER work-up suggestive of right-sided acute dacryocystitis with reactive retropharyngeal lymphadenopathy, she was seen by ophthalmology and requested to be admitted for IV antibiotics for a few days.    Subjective:    Bary Leriche today has, No headache, No chest pain, No abdominal pain - No Nausea, No new weakness tingling or numbness, No Cough - SOB.  Right-sided eye and facial pain improved.   Assessment  & Plan :     1.  Acute right-sided dacryocystitis with reactive preseptal cellulitis and retropharyngeal lymphadenopathy.  Seen by ophthalmologist Dr. Alanda Slim, currently on broad-spectrum IV vancomycin and Zosyn, so far cultures are negative, continue warm compresses, clinically she is better.  Plan is to continue antibiotics till Monday and then discharge her with outpatient ophthalmology follow-up for dacryocystectomy.  2.  History  of PE and DVT 2 separate occasions.  Currently on heparin, resume Xarelto after her elective surgery as above.   3.  DM type II.  Continue Januvia and sliding scale.  Metformin on hold.  CBG (last 3)  Recent Labs    05/23/17 2309 05/24/17 0732 05/24/17 1126  GLUCAP 138* 113* 129*   4.  History of obstructive sleep apnea.  CPAP nightly.  5.  History of asthma.  Stable supportive care.  6.  HX of chronic diastolic CHF EF 11%.  Currently compensated.  Continue home dose Lasix.  7.  History of sarcoidosis.  On Humira per her primary dermatologist.  No acute issues.  8.  EKG 2.  Stable.  9.  Essential hypertension.  Stable on Coreg.    Diet :  Diet Order           Diet heart healthy/carb modified Room service appropriate? Yes; Fluid consistency: Thin  Diet effective now           Family Communication  : None present  Code Status : Full  Disposition Plan  : Home in 1 to 2 days once infection improved  Consults  : Ophthalmology  Procedures  : CT scan soft tissue neck.  DVT Prophylaxis  : Heparin drip, Xarelto at home  Lab Results  Component Value Date   PLT 159 05/24/2017    Inpatient Medications  Scheduled Meds: . carvedilol  12.5 mg Oral BID WC  . clobetasol ointment   Topical BID  . clonazePAM  0.5-1 mg Oral QHS  . furosemide  40 mg Oral Daily  . insulin aspart  0-9 Units Subcutaneous TID WC  . linagliptin  5 mg Oral Daily  . mometasone-formoterol  2 puff Inhalation BID  . pantoprazole  40 mg Oral Daily  . potassium chloride  10 mEq Oral Daily  . topiramate  50 mg Oral BID  . venlafaxine XR  75 mg Oral Q breakfast   Continuous Infusions: . heparin 1,650 Units/hr (05/24/17 0553)  . piperacillin-tazobactam (ZOSYN)  IV 3.375 g (05/24/17 0847)  . vancomycin Stopped (05/24/17 0745)   PRN Meds:.acetaminophen **OR** acetaminophen, albuterol, gabapentin, morphine injection, ondansetron **OR** ondansetron (ZOFRAN) IV  Antibiotics  :    Anti-infectives  (From admission, onward)   Start     Dose/Rate Route Frequency Ordered Stop   05/24/17 0500  vancomycin (VANCOCIN) IVPB 1000 mg/200 mL premix  Status:  Discontinued     1,000 mg 200 mL/hr over 60 Minutes Intravenous Every 12 hours 05/23/17 1715 05/23/17 2024   05/24/17 0500  vancomycin (VANCOCIN) IVPB 1000 mg/200 mL premix     1,000 mg 100 mL/hr over 120 Minutes Intravenous Every 12 hours 05/23/17 2024     05/24/17 0000  piperacillin-tazobactam (ZOSYN) IVPB 3.375 g     3.375 g 12.5 mL/hr over 240 Minutes Intravenous Every 8 hours 05/23/17 1715     05/23/17 1630  piperacillin-tazobactam (ZOSYN) IVPB 3.375 g     3.375 g 100 mL/hr over 30 Minutes Intravenous  Once 05/23/17 1628 05/23/17 1750   05/23/17 1630  vancomycin (VANCOCIN) 2,000 mg in sodium chloride 0.9 % 500 mL IVPB     2,000 mg 250 mL/hr over 120 Minutes Intravenous  Once 05/23/17 1628 05/24/17 0700         Objective:   Vitals:   05/23/17 2259 05/24/17 0435 05/24/17 0952 05/24/17 1003  BP: 130/83 130/68 121/63   Pulse: 82 73 80   Resp: 18 16 20    Temp: 98.7 F (37.1 C) 98.4 F (36.9 C) 97.7 F (36.5 C)   TempSrc: Oral Oral Oral   SpO2: 99% 97% 91% 98%  Weight: 125.2 kg (276 lb 1.6 oz)     Height: 5\' 7"  (1.702 m)       Wt Readings from Last 3 Encounters:  05/23/17 125.2 kg (276 lb 1.6 oz)  05/23/17 125.2 kg (276 lb)  04/25/17 129.8 kg (286 lb 1.3 oz)     Intake/Output Summary (Last 24 hours) at 05/24/2017 1146 Last data filed at 05/24/2017 0934 Gross per 24 hour  Intake 531.81 ml  Output  325 ml  Net 206.81 ml     Physical Exam  Awake Alert, Oriented X 3, No new F.N deficits, Normal affect [place].AT,PERRAL, right periorbital area cellulitis with small right nasolabial open wound currently not draining Supple Neck,No JVD, No cervical lymphadenopathy appriciated.  Symmetrical Chest wall movement, Good air movement bilaterally, CTAB RRR,No Gallops,Rubs or new Murmurs, No Parasternal Heave +ve B.Sounds, Abd  Soft, No tenderness, No organomegaly appriciated, No rebound - guarding or rigidity. No Cyanosis, Clubbing or edema, No new Rash or bruise      Data Review:    CBC Recent Labs  Lab 05/23/17 0910 05/24/17 0344  WBC 10.4 8.3  HGB 11.2* 11.0*  HCT 37.8 37.1  PLT 224 159  MCV 85.3 85.7  MCH 25.3* 25.4*  MCHC 29.6* 29.6*  RDW 18.9* 19.1*  LYMPHSABS 2.7  --   MONOABS 1.0  --   EOSABS 0.2  --   BASOSABS 0.0  --     Chemistries  Recent Labs  Lab 05/23/17 0910 05/24/17 0344  NA 141 139  K 3.9 4.6  CL 108 109  CO2 21* 21*  GLUCOSE 121* 120*  BUN 13 12  CREATININE 1.19* 1.04*  CALCIUM 9.1 8.5*  AST 27  --   ALT 19  --   ALKPHOS 105  --   BILITOT 0.5  --    ------------------------------------------------------------------------------------------------------------------ No results for input(s): CHOL, HDL, LDLCALC, TRIG, CHOLHDL, LDLDIRECT in the last 72 hours.  Lab Results  Component Value Date   HGBA1C 6.7 (H) 04/02/2017   ------------------------------------------------------------------------------------------------------------------ No results for input(s): TSH, T4TOTAL, T3FREE, THYROIDAB in the last 72 hours.  Invalid input(s): FREET3 ------------------------------------------------------------------------------------------------------------------ No results for input(s): VITAMINB12, FOLATE, FERRITIN, TIBC, IRON, RETICCTPCT in the last 72 hours.  Coagulation profile No results for input(s): INR, PROTIME in the last 168 hours.  No results for input(s): DDIMER in the last 72 hours.  Cardiac Enzymes No results for input(s): CKMB, TROPONINI, MYOGLOBIN in the last 168 hours.  Invalid input(s): CK ------------------------------------------------------------------------------------------------------------------ No results found for: BNP  Micro Results Recent Results (from the past 240 hour(s))  Blood culture (routine x 2)     Status: None (Preliminary result)    Collection Time: 05/23/17  5:10 PM  Result Value Ref Range Status   Specimen Description BLOOD RIGHT ANTECUBITAL  Final   Special Requests   Final    BOTTLES DRAWN AEROBIC AND ANAEROBIC Blood Culture adequate volume   Culture   Final    NO GROWTH < 24 HOURS Performed at [facility], [address], [place], [place] [address]    Report Status PENDING  Incomplete  Blood culture (routine x 2)     Status: None (Preliminary result)   Collection Time: 05/23/17  5:17 PM  Result Value Ref Range Status   Specimen Description BLOOD RIGHT HAND  Final   Special Requests   Final    BOTTLES DRAWN AEROBIC AND ANAEROBIC Blood Culture adequate volume   Culture   Final    NO GROWTH < 24 HOURS Performed at [facility], [place] [address], [place], [place] [address]    Report Status PENDING  Incomplete    Radiology Reports Dg Chest 2 View  Result Date: 05/23/2017 CLINICAL DATA:  Facial cellulitis EXAM: CHEST - 2 VIEW COMPARISON:  12/08/2016 chest radiograph. FINDINGS: Stable cardiomediastinal silhouette with normal heart size. No pneumothorax. No pleural effusion. Mild reticulonodular opacities at the right greater than left lung apices, not appreciably changed. Mild reticular  opacities at the right lung base, unchanged. No pulmonary edema. No acute consolidative airspace disease. IMPRESSION: No acute cardiopulmonary disease. Stable biapical reticulonodular and right basilar reticular opacities, attributed to sarcoidosis on 07/21/2016 chest CT. Electronically Signed   By: Ilona Sorrel M.D.   On: 05/23/2017 10:12   Ct Soft Tissue Neck W Contrast  Result Date: 05/23/2017 CLINICAL DATA:  57 year old female with right orbit face and neck cellulitis. Sarcoidosis. EXAM: CT NECK WITH CONTRAST TECHNIQUE: Multidetector CT imaging of the neck was performed using the standard protocol following the bolus administration of intravenous contrast. CONTRAST:  172mL OMNIPAQUE IOHEXOL 300 MG/ML  SOLN  COMPARISON:  Brain MRI 05/07/2016. Head CT without contrast 05/06/2013. Cervical spine CT without contrast 03/07/2009. chest CTA 07/21/2016. FINDINGS: Pharynx and larynx: Larynx soft tissue contours are within normal limits. The hypopharynx is within normal limits. At the soft palate and nasopharynx there is widespread uniform pharyngeal mucosal space hyperenhancement (series 3, image 50). Retained secretions in the nasopharynx. Negative parapharyngeal spaces. The retropharyngeal space is remarkable for mildly enlarged bilateral retropharyngeal lymph nodes measuring 7-9 millimeters in thickness (series 3, images 54 and 50), as well as a retropharyngeal course of the right carotid. No retropharyngeal fluid. Salivary glands: Negative sublingual space. Submandibular glands and parotid glands are within normal limits. Thyroid: Mild thyromegaly.  No discrete thyroid nodule or mass. Lymph nodes: Enlarged retropharyngeal lymph nodes are described above. The other lymph node spaces throughout the neck appear normal. No cystic or necrotic nodes. Vascular: The major vascular structures in the neck and at the skull base are patent. There is a retropharyngeal course of the right carotid. The left carotid has much less of a retropharyngeal course today versus that seen on the 2011 CT. Calcified atherosclerosis at the skull base. Limited intracranial: Negative visualized brain parenchyma. Visualized orbits: There is confluent abnormal soft tissue thickening in the right preseptal space, especially within the medial aspect of the orbit. Centrally within the abnormal preseptal soft tissue there is tubular hypodensity which appears contiguous with the right lacrimal sac and nasolacrimal duct (series 3, image 30 and coronal series 6 images 29 through 35). Furthermore, there was a small simple appearing cystic lesion at the right lacrimal sac on the 2018 MRI (9-10 millimeters at that time, series 5, image 9 of that study). The  postseptal orbit soft tissues remain normal. Inflammation tracks into the right premalar space. The left orbit is normal. Mastoids and visualized paranasal sinuses: There is a perforated anterior nasal septum. This was not evident in 2018. There are retained secretions throughout the nasal cavity, greater on the left, and continuing into the nasopharynx. There is only trace paranasal sinus mucosal thickening, primarily within the right maxillary sinus. The bilateral tympanic cavities are clear. There are mild bilateral mastoid effusions which appears stable since 2018. Skeleton: Absent maxillary and posterior mandible dentition. The facial bones appear stable and intact. Mild for age cervical spine degeneration. No acute osseous abnormality identified. Upper chest: Lymph nodes there are scattered calcified mediastinal. There is partially visible bilateral pulmonary perihilar opacity. The visible upper chest appears stable since 2018. IMPRESSION: 1. Right preseptal soft tissue thickening and/or inflammation with low-density material contiguous with the right nasolacrimal duct. This is suggestive of a complicated Acute Infectious Dacryocystitis with preseptal cellulitis. However, there was a small cystic lesion of the right lacrimal sac in 2018, and rarely sarcoidosis can directly involve the nasolacrimal duct. See also #2. No postseptal involvement. 2. Anterior nasal septal perforation which may be new  or progressed from the prior studies. Superimposed acute inflammation of the nasal cavity and mucositis type appearance of the nasopharynx with associated mild retropharyngeal lymphadenopathy which appears reactive. Again, top considerations are Acute Infection and Sarcoidosis. 3. Negative neck soft tissues otherwise. 4. Stable chronic sarcoidosis sequelae in the visible upper chest. Electronically Signed   By: Genevie Ann M.D.   On: 05/23/2017 19:35    Time Spent in minutes  30   Lala Lund M.D on 05/24/2017 at  11:46 AM  Between 7am to 7pm - Pager - [phone] ( page via [email or web address], text pages only, please mention full 10 digit call back number). After 7pm go to [email or web address] - password [email or web address]

## 2017-05-24 NOTE — Progress Notes (Signed)
Received call from pharmacy at 2045 to stop Heparin gtt at this time and to restart at a lower rate, based on order, in an hour.   Eleanora Neighbor, RN

## 2017-05-25 ENCOUNTER — Encounter (HOSPITAL_COMMUNITY): Payer: Self-pay | Admitting: *Deleted

## 2017-05-25 LAB — GLUCOSE, CAPILLARY
GLUCOSE-CAPILLARY: 118 mg/dL — AB (ref 65–99)
Glucose-Capillary: 111 mg/dL — ABNORMAL HIGH (ref 65–99)
Glucose-Capillary: 118 mg/dL — ABNORMAL HIGH (ref 65–99)
Glucose-Capillary: 162 mg/dL — ABNORMAL HIGH (ref 65–99)

## 2017-05-25 LAB — CBC
HEMATOCRIT: 33.1 % — AB (ref 36.0–46.0)
Hemoglobin: 9.7 g/dL — ABNORMAL LOW (ref 12.0–15.0)
MCH: 25.3 pg — ABNORMAL LOW (ref 26.0–34.0)
MCHC: 29.3 g/dL — ABNORMAL LOW (ref 30.0–36.0)
MCV: 86.2 fL (ref 78.0–100.0)
Platelets: 196 10*3/uL (ref 150–400)
RBC: 3.84 MIL/uL — ABNORMAL LOW (ref 3.87–5.11)
RDW: 19.3 % — AB (ref 11.5–15.5)
WBC: 6.9 10*3/uL (ref 4.0–10.5)

## 2017-05-25 LAB — HEPARIN LEVEL (UNFRACTIONATED)
HEPARIN UNFRACTIONATED: 0.98 [IU]/mL — AB (ref 0.30–0.70)
HEPARIN UNFRACTIONATED: 0.85 [IU]/mL — AB (ref 0.30–0.70)
HEPARIN UNFRACTIONATED: 0.63 [IU]/mL (ref 0.30–0.70)

## 2017-05-25 LAB — APTT: aPTT: 103 seconds — ABNORMAL HIGH (ref 24–36)

## 2017-05-25 NOTE — Progress Notes (Signed)
ANTICOAGULATION CONSULT NOTE  Pharmacy Consult for heparin Indication: pulmonary embolus  Patient Measurements: Height: 5\' 7"  (170.2 cm) Weight: 276 lb 1.7 oz (125.2 kg) IBW/kg (Calculated) : 61.6 Heparin Dosing Weight: 92 kg  Vital Signs: Temp: 98 F (36.7 C) (05/12 0503) Temp Source: Oral (05/12 0503) BP: 128/71 (05/12 0503) Pulse Rate: 67 (05/12 0503)  Labs: Recent Labs    05/23/17 0910  05/24/17 0344 05/24/17 1116 05/24/17 1836 05/25/17 0507  HGB 11.2*  --  11.0*  --   --  9.7*  HCT 37.8  --  37.1  --   --  33.1*  PLT 224  --  159  --   --  196  APTT  --    < > 32 138* 147* 103*  HEPARINUNFRC  --   --  1.56*  --   --  0.98*  CREATININE 1.19*  --  1.04*  --   --   --    < > = values in this interval not displayed.    Estimated Creatinine Clearance: 83 mL/min (A) (by C-G formula based on SCr of 1.04 mg/dL (H)).  Assessment: 57 y.o. female with right eye swelling, h/o PE, Xarelto on hold until planned surgery next week. Pharmacy consulted for heparin dosing. Last dose of Xarelto 5/9 and likely cleared from system. Will start adjusting based on heparin level. APTT and heparin level remain elevated after rate reduction. No bleeding noted.   Goal of Therapy:  Heparin level 0.3-0.7 units/ml Monitor platelets by anticoagulation protocol: Yes   Plan:  Decrease heparin infusion to 1250 unit/hr Check a 6 hr HL Daily HL  Angus Seller, PharmD Pharmacy Resident Clinical Phone for 05/25/2017 until 3:30pm: [phone] If after 3:30pm, please call main pharmacy at [phone] 05/25/2017 7:10 AM

## 2017-05-25 NOTE — Progress Notes (Signed)
@IPLOG @        PROGRESS NOTE                                                                                                                                                                                                             Patient Demographics:    [name], is a 57 y.o. female, DOB - 11-19-1960, ZOX:[id number]  Admit date - 05/23/2017   Admitting Physician Rise Patience, MD  Outpatient Primary MD for the patient is Plotnikov, Evie Lacks, MD  LOS - 2  No chief complaint on file.      Brief Narrative  [name] is a 57 y.o. female with history of sarcoidosis on Humira, pulmonary embolism on Xarelto, diabetes mellitus type 2, diastolic dysfunction, hypertension, chronic kidney disease stage II has been experiencing increasing pain and discharge over the right eye mostly in the medial aspect over the last 4 days which has been worsening with increasing discharge.  Denies any fever chills.  Had a similar episode around 3 to 4 weeks ago and was treated with doxycycline following which symptoms improved, and the ER work-up suggestive of right-sided acute dacryocystitis with reactive retropharyngeal lymphadenopathy, she was seen by ophthalmology and requested to be admitted for IV antibiotics for a few days.    Subjective:   Patient in bed, appears comfortable, denies any headache, no fever, no chest pain or pressure, no shortness of breath , no abdominal pain. No focal weakness.    Assessment  & Plan :     1.  Acute right-sided dacryocystitis with reactive preseptal cellulitis and retropharyngeal lymphadenopathy.  Seen by ophthalmologist Dr. Alanda Slim, currently on broad-spectrum IV vancomycin and Zosyn, so far cultures are negative, continue warm compresses, likely much better on 05/25/2017 and now almost completely pain-free.  Plan is to continue antibiotics till Monday and then discharge her with outpatient ophthalmology follow-up for dacryocystectomy.   Likely Monday or Tuesday will do after discussing with ophthalmologist.  2.  History of PE and DVT 2 separate occasions.  Currently on heparin, resume Xarelto after her elective surgery as above.  3.  DM type II.  Continue Januvia and sliding scale.  Metformin on hold.  CBG (last 3)  Recent Labs    05/24/17 1634 05/24/17 2042 05/25/17 0738  GLUCAP 88 114* 118*   4.  History of obstructive sleep apnea.  CPAP nightly.  5.  History of asthma.  Stable supportive care.  6.  HX of chronic diastolic CHF EF 81%.  Currently compensated.  Continue  home dose Lasix.  7.  History of sarcoidosis.  On Humira per her primary dermatologist.  No acute issues.  8.  EKG 2.  Stable.  9.  Essential hypertension.  Stable on Coreg.    Diet :  Diet Order           Diet heart healthy/carb modified Room service appropriate? Yes; Fluid consistency: Thin  Diet effective now           Family Communication  : None present  Code Status : Full  Disposition Plan  : Home in 1 to 2 days once infection improved  Consults  : Ophthalmology  Procedures  : CT scan soft tissue neck.  DVT Prophylaxis  : Heparin drip, Xarelto at home  Lab Results  Component Value Date   PLT 196 05/25/2017    Inpatient Medications  Scheduled Meds: . carvedilol  12.5 mg Oral BID WC  . clobetasol ointment   Topical BID  . clonazePAM  0.5-1 mg Oral QHS  . furosemide  40 mg Oral Daily  . insulin aspart  0-9 Units Subcutaneous TID WC  . linagliptin  5 mg Oral Daily  . mometasone-formoterol  2 puff Inhalation BID  . pantoprazole  40 mg Oral Daily  . potassium chloride  10 mEq Oral Daily  . topiramate  50 mg Oral BID  . venlafaxine XR  75 mg Oral Q breakfast   Continuous Infusions: . heparin 1,250 Units/hr (05/25/17 0855)  . piperacillin-tazobactam (ZOSYN)  IV 3.375 g (05/25/17 0854)  . vancomycin Stopped (05/25/17 0727)   PRN Meds:.acetaminophen **OR** acetaminophen, albuterol, gabapentin, morphine injection,  ondansetron **OR** ondansetron (ZOFRAN) IV  Antibiotics  :    Anti-infectives (From admission, onward)   Start     Dose/Rate Route Frequency Ordered Stop   05/24/17 0500  vancomycin (VANCOCIN) IVPB 1000 mg/200 mL premix  Status:  Discontinued     1,000 mg 200 mL/hr over 60 Minutes Intravenous Every 12 hours 05/23/17 1715 05/23/17 2024   05/24/17 0500  vancomycin (VANCOCIN) IVPB 1000 mg/200 mL premix     1,000 mg 100 mL/hr over 120 Minutes Intravenous Every 12 hours 05/23/17 2024     05/24/17 0000  piperacillin-tazobactam (ZOSYN) IVPB 3.375 g     3.375 g 12.5 mL/hr over 240 Minutes Intravenous Every 8 hours 05/23/17 1715     05/23/17 1630  piperacillin-tazobactam (ZOSYN) IVPB 3.375 g     3.375 g 100 mL/hr over 30 Minutes Intravenous  Once 05/23/17 1628 05/23/17 1750   05/23/17 1630  vancomycin (VANCOCIN) 2,000 mg in sodium chloride 0.9 % 500 mL IVPB     2,000 mg 250 mL/hr over 120 Minutes Intravenous  Once 05/23/17 1628 05/24/17 0700         Objective:   Vitals:   05/24/17 2042 05/24/17 2100 05/25/17 0503 05/25/17 1008  BP: 127/65  128/71 117/72  Pulse: 83  67 61  Resp: 18  18 18   Temp: 98.4 F (36.9 C)  98 F (36.7 C) 97.9 F (36.6 C)  TempSrc:   Oral Oral  SpO2: 99% 99% 97% 94%  Weight: 125.2 kg (276 lb 1.7 oz)     Height:        Wt Readings from Last 3 Encounters:  05/24/17 125.2 kg (276 lb 1.7 oz)  05/23/17 125.2 kg (276 lb)  04/25/17 129.8 kg (286 lb 1.3 oz)     Intake/Output Summary (Last 24 hours) at 05/25/2017 1046 Last data filed at 05/25/2017 0358 Gross per 24  hour  Intake 878 ml  Output 500 ml  Net 378 ml     Physical Exam  Awake Alert, Oriented X 3, No new F.N deficits, Normal affect [place].AT,PERRAL, right periorbital area cellulitis with small right nasolabial open wound currently not draining - area improved Supple Neck,No JVD, No cervical lymphadenopathy appriciated.  Symmetrical Chest wall movement, Good air movement bilaterally, CTAB RRR,No  Gallops,Rubs or new Murmurs, No Parasternal Heave +ve B.Sounds, Abd Soft, No tenderness, No organomegaly appriciated, No rebound - guarding or rigidity. No Cyanosis, Clubbing or edema, No new Rash or bruise      Data Review:    CBC Recent Labs  Lab 05/23/17 0910 05/24/17 0344 05/25/17 0507  WBC 10.4 8.3 6.9  HGB 11.2* 11.0* 9.7*  HCT 37.8 37.1 33.1*  PLT 224 159 196  MCV 85.3 85.7 86.2  MCH 25.3* 25.4* 25.3*  MCHC 29.6* 29.6* 29.3*  RDW 18.9* 19.1* 19.3*  LYMPHSABS 2.7  --   --   MONOABS 1.0  --   --   EOSABS 0.2  --   --   BASOSABS 0.0  --   --     Chemistries  Recent Labs  Lab 05/23/17 0910 05/24/17 0344  NA 141 139  K 3.9 4.6  CL 108 109  CO2 21* 21*  GLUCOSE 121* 120*  BUN 13 12  CREATININE 1.19* 1.04*  CALCIUM 9.1 8.5*  AST 27  --   ALT 19  --   ALKPHOS 105  --   BILITOT 0.5  --    ------------------------------------------------------------------------------------------------------------------ No results for input(s): CHOL, HDL, LDLCALC, TRIG, CHOLHDL, LDLDIRECT in the last 72 hours.  Lab Results  Component Value Date   HGBA1C 6.7 (H) 04/02/2017   ------------------------------------------------------------------------------------------------------------------ No results for input(s): TSH, T4TOTAL, T3FREE, THYROIDAB in the last 72 hours.  Invalid input(s): FREET3 ------------------------------------------------------------------------------------------------------------------ No results for input(s): VITAMINB12, FOLATE, FERRITIN, TIBC, IRON, RETICCTPCT in the last 72 hours.  Coagulation profile No results for input(s): INR, PROTIME in the last 168 hours.  No results for input(s): DDIMER in the last 72 hours.  Cardiac Enzymes No results for input(s): CKMB, TROPONINI, MYOGLOBIN in the last 168 hours.  Invalid input(s): CK ------------------------------------------------------------------------------------------------------------------ No  results found for: BNP  Micro Results Recent Results (from the past 240 hour(s))  Blood culture (routine x 2)     Status: None (Preliminary result)   Collection Time: 05/23/17  5:10 PM  Result Value Ref Range Status   Specimen Description BLOOD RIGHT ANTECUBITAL  Final   Special Requests   Final    BOTTLES DRAWN AEROBIC AND ANAEROBIC Blood Culture adequate volume   Culture   Final    NO GROWTH < 24 HOURS Performed at [facility], [address], [place], [place] [address]    Report Status PENDING  Incomplete  Blood culture (routine x 2)     Status: None (Preliminary result)   Collection Time: 05/23/17  5:17 PM  Result Value Ref Range Status   Specimen Description BLOOD RIGHT HAND  Final   Special Requests   Final    BOTTLES DRAWN AEROBIC AND ANAEROBIC Blood Culture adequate volume   Culture   Final    NO GROWTH < 24 HOURS Performed at [facility], [place] [address], [place], [place] [address]    Report Status PENDING  Incomplete    Radiology Reports Dg Chest 2 View  Result Date: 05/23/2017 CLINICAL DATA:  Facial cellulitis EXAM: CHEST - 2 VIEW COMPARISON:  12/08/2016 chest radiograph. FINDINGS: Stable cardiomediastinal silhouette with normal heart size. No pneumothorax. No pleural effusion. Mild reticulonodular opacities at the right greater than left lung apices, not appreciably changed. Mild reticular opacities at the right lung base, unchanged. No pulmonary edema. No acute consolidative airspace disease. IMPRESSION: No acute cardiopulmonary disease. Stable biapical reticulonodular and right basilar reticular opacities, attributed to sarcoidosis on 07/21/2016 chest CT. Electronically Signed   By: Ilona Sorrel M.D.   On: 05/23/2017 10:12   Ct Soft Tissue Neck W Contrast  Result Date: 05/23/2017 CLINICAL DATA:  57 year old female with right orbit face and neck cellulitis. Sarcoidosis. EXAM: CT NECK WITH CONTRAST TECHNIQUE: Multidetector CT imaging of the neck was  performed using the standard protocol following the bolus administration of intravenous contrast. CONTRAST:  182mL OMNIPAQUE IOHEXOL 300 MG/ML  SOLN COMPARISON:  Brain MRI 05/07/2016. Head CT without contrast 05/06/2013. Cervical spine CT without contrast 03/07/2009. chest CTA 07/21/2016. FINDINGS: Pharynx and larynx: Larynx soft tissue contours are within normal limits. The hypopharynx is within normal limits. At the soft palate and nasopharynx there is widespread uniform pharyngeal mucosal space hyperenhancement (series 3, image 50). Retained secretions in the nasopharynx. Negative parapharyngeal spaces. The retropharyngeal space is remarkable for mildly enlarged bilateral retropharyngeal lymph nodes measuring 7-9 millimeters in thickness (series 3, images 54 and 50), as well as a retropharyngeal course of the right carotid. No retropharyngeal fluid. Salivary glands: Negative sublingual space. Submandibular glands and parotid glands are within normal limits. Thyroid: Mild thyromegaly.  No discrete thyroid nodule or mass. Lymph nodes: Enlarged retropharyngeal lymph nodes are described above. The other lymph node spaces throughout the neck appear normal. No cystic or necrotic nodes. Vascular: The major vascular structures in the neck and at the skull base are patent. There is a retropharyngeal course of the right carotid. The left carotid has much less of a retropharyngeal course today versus that seen on the 2011 CT. Calcified atherosclerosis at the skull base. Limited intracranial: Negative visualized brain parenchyma. Visualized orbits: There is confluent abnormal soft tissue thickening in the right preseptal space, especially within the medial aspect of the orbit. Centrally within the abnormal preseptal soft tissue there is tubular hypodensity which appears contiguous with the right lacrimal sac and nasolacrimal duct (series 3, image 30 and coronal series 6 images 29 through 35). Furthermore, there was a small  simple appearing cystic lesion at the right lacrimal sac on the 2018 MRI (9-10 millimeters at that time, series 5, image 9 of that study). The postseptal orbit soft tissues remain normal. Inflammation tracks into the right premalar space. The left orbit is normal. Mastoids and visualized paranasal sinuses: There is a perforated anterior nasal septum. This was not evident in 2018. There are retained secretions throughout the nasal cavity, greater on the left, and continuing into the nasopharynx. There is only trace paranasal sinus mucosal thickening, primarily within the right maxillary sinus. The bilateral tympanic cavities are clear. There are mild bilateral mastoid effusions which appears stable since 2018. Skeleton: Absent maxillary and posterior mandible dentition. The facial bones appear stable and intact. Mild for age cervical spine degeneration. No acute osseous abnormality identified. Upper chest: Lymph nodes there are scattered calcified mediastinal. There is partially visible bilateral pulmonary perihilar opacity. The visible upper chest appears stable since 2018. IMPRESSION: 1. Right preseptal soft tissue thickening and/or inflammation with low-density material contiguous with the right nasolacrimal duct. This is suggestive of a complicated Acute Infectious Dacryocystitis with preseptal cellulitis. However, there was a small cystic  lesion of the right lacrimal sac in 2018, and rarely sarcoidosis can directly involve the nasolacrimal duct. See also #2. No postseptal involvement. 2. Anterior nasal septal perforation which may be new or progressed from the prior studies. Superimposed acute inflammation of the nasal cavity and mucositis type appearance of the nasopharynx with associated mild retropharyngeal lymphadenopathy which appears reactive. Again, top considerations are Acute Infection and Sarcoidosis. 3. Negative neck soft tissues otherwise. 4. Stable chronic sarcoidosis sequelae in the visible upper  chest. Electronically Signed   By: Genevie Ann M.D.   On: 05/23/2017 19:35    Time Spent in minutes  30   Lala Lund M.D on 05/25/2017 at 10:46 AM  Between 7am to 7pm - Pager - [phone] ( page via [email or web address], text pages only, please mention full 10 digit call back number). After 7pm go to [email or web address] - password [facility]

## 2017-05-25 NOTE — Progress Notes (Signed)
ANTICOAGULATION CONSULT NOTE  Pharmacy Consult for heparin Indication: pulmonary embolus  Patient Measurements: Height: 5\' 7"  (170.2 cm) Weight: 276 lb 1.7 oz (125.2 kg) IBW/kg (Calculated) : 61.6 Heparin Dosing Weight: 92 kg  Vital Signs: Temp: 97.9 F (36.6 C) (05/12 1008) Temp Source: Oral (05/12 1008) BP: 117/72 (05/12 1008) Pulse Rate: 61 (05/12 1008)  Labs: Recent Labs    05/23/17 0910  05/24/17 0344 05/24/17 1116 05/24/17 1836 05/25/17 0507  HGB 11.2*  --  11.0*  --   --  9.7*  HCT 37.8  --  37.1  --   --  33.1*  PLT 224  --  159  --   --  196  APTT  --    < > 32 138* 147* 103*  HEPARINUNFRC  --   --  1.56*  --   --  0.98*  CREATININE 1.19*  --  1.04*  --   --   --    < > = values in this interval not displayed.    Estimated Creatinine Clearance: 83 mL/min (A) (by C-G formula based on SCr of 1.04 mg/dL (H)).  Assessment: 57 y.o. female with right eye swelling, h/o PE, Xarelto on hold until planned surgery next week. Pharmacy consulted for heparin dosing. Last dose of Xarelto 5/9. No bleeding noted. Heparin level is supratherapeutic at 0.85 on 1,250 units/hr.   Goal of Therapy:  Heparin level 0.3-0.7 units/ml Monitor platelets by anticoagulation protocol: Yes   Plan:  Decrease heparin infusion to 1150 unit/hr Check a 6 hr HL Daily HL  Angus Seller, PharmD Pharmacy Resident Clinical Phone for 05/25/2017 until 3:30pm: [phone] If after 3:30pm, please call main pharmacy at [phone] 05/25/2017 3:18 PM

## 2017-05-25 NOTE — Progress Notes (Signed)
ANTICOAGULATION CONSULT NOTE  Pharmacy Consult for heparin Indication: pulmonary embolus  Patient Measurements: Height: 5\' 7"  (170.2 cm) Weight: 276 lb 1.7 oz (125.2 kg) IBW/kg (Calculated) : 61.6 Heparin Dosing Weight: 92 kg  Vital Signs: Temp: 97.7 F (36.5 C) (05/12 2333) Temp Source: Oral (05/12 2333) BP: 139/77 (05/12 2333) Pulse Rate: 68 (05/12 2333)  Labs: Recent Labs    05/23/17 0910  05/24/17 0344 05/24/17 1116 05/24/17 1836 05/25/17 0507 05/25/17 1352 05/25/17 2211  HGB 11.2*  --  11.0*  --   --  9.7*  --   --   HCT 37.8  --  37.1  --   --  33.1*  --   --   PLT 224  --  159  --   --  196  --   --   APTT  --    < > 32 138* 147* 103*  --   --   HEPARINUNFRC  --    < > 1.56*  --   --  0.98* 0.85* 0.63  CREATININE 1.19*  --  1.04*  --   --   --   --   --    < > = values in this interval not displayed.    Estimated Creatinine Clearance: 83 mL/min (A) (by C-G formula based on SCr of 1.04 mg/dL (H)).  Assessment: 57 y.o. female with h/o PE, Xarelto on hold for heparin Goal of Therapy:  Heparin level 0.3-0.7 units/ml Monitor platelets by anticoagulation protocol: Yes   Plan:  Continue Heparin at current rate  Follow-up am labs.   Phillis Knack, PharmD, BCPS 05/25/2017 11:33 PM

## 2017-05-26 ENCOUNTER — Other Ambulatory Visit: Payer: Self-pay | Admitting: Pharmacist

## 2017-05-26 LAB — CBC
HCT: 34.5 % — ABNORMAL LOW (ref 36.0–46.0)
HEMOGLOBIN: 10.1 g/dL — AB (ref 12.0–15.0)
MCH: 25.4 pg — AB (ref 26.0–34.0)
MCHC: 29.3 g/dL — AB (ref 30.0–36.0)
MCV: 86.9 fL (ref 78.0–100.0)
Platelets: 196 10*3/uL (ref 150–400)
RBC: 3.97 MIL/uL (ref 3.87–5.11)
RDW: 19.4 % — ABNORMAL HIGH (ref 11.5–15.5)
WBC: 6.7 10*3/uL (ref 4.0–10.5)

## 2017-05-26 LAB — HEPARIN LEVEL (UNFRACTIONATED): Heparin Unfractionated: 0.37 IU/mL (ref 0.30–0.70)

## 2017-05-26 LAB — GLUCOSE, CAPILLARY: GLUCOSE-CAPILLARY: 96 mg/dL (ref 65–99)

## 2017-05-26 MED ORDER — SULFAMETHOXAZOLE-TRIMETHOPRIM 800-160 MG PO TABS: 1.0000 | ORAL_TABLET | Freq: Two times a day (BID) | ORAL | 0 refills | Status: DC

## 2017-05-26 MED ORDER — METFORMIN HCL ER 500 MG PO TB24: ORAL_TABLET | ORAL | 0 refills | Status: DC

## 2017-05-26 MED ORDER — AMOXICILLIN-POT CLAVULANATE 500-125 MG PO TABS: 1.0000 | ORAL_TABLET | Freq: Two times a day (BID) | ORAL | 0 refills | Status: DC

## 2017-05-26 NOTE — Progress Notes (Signed)
Pt discharged home with friend. AVS and scripts reviewed and given to patient. All belongings sent with patient. VSS. BP 121/79 (BP Location: Left Arm)   Pulse 71   Temp 97.6 F (36.4 C) (Oral)   Resp 16   Ht 5\' 7"  (1.702 m)   Wt 125.2 kg (276 lb 1.7 oz)   SpO2 97%   BMI 43.24 kg/m

## 2017-05-26 NOTE — Patient Outreach (Addendum)
[facility]) Care Management  05/26/2017  Shannon 1960/06/22 [id number]   Patient is currently hospitalized for preseptal cellulitis of her right eye. She answered the phone. HIPAA identifiers were obtained. Patient said she was going to be discharged later that day.   She was worried about where she is going to get her medications. She was referred to [facility] as they deliver. She was previously with [facility] but they were bought out by [facility].   Patient says [facility] told her they have a $3 to $6 delivery fee and she was frustrated and hung up.  [employer] was called. They have a $3 delivery fee per delivery.     Plan: Call patient back for post discharge medication review.  Inform patient about the delivery fee.  Elayne Guerin, PharmD, [place] Clinical Pharmacist [phone]

## 2017-05-26 NOTE — Care Management Note (Signed)
Case Management Note  Patient Details  Name: LEAH THORNBERRY MRN: [id number] Date of Birth: Jul 12, 1960  Subjective/Objective:  Admitted for Cellulitis.              Action/Plan: Patient with discharge orders home today.  Spoke with patient regarding recommendation for Home Health PT, Rn; offered choice patient selected [facility].  Referral for Home Health PT/RN called to hospital liaison [facility] and accepted.  Also notified Dorian Pod that patient has discharge orders home today.  Discussed DME and patient denies any needs at this time, states she has her equipment from past knee surgery.  Expected Discharge Date:  05/26/17               Expected Discharge Plan:  [place]  In-House Referral:   N/A  Discharge planning Services  CM Consult  Post Acute Care Choice:  Home Health Choice offered to:  Patient  DME Arranged:  N/A DME Agency:  NA  HH Arranged:  RN, PT [place] Agency:  [facility]  Status of Service:  Completed, signed off  If discussed at [place] of Stay Meetings, dates discussed:    Additional Comments:  Kristen Cardinal, RN 05/26/2017, 11:06 AM

## 2017-05-26 NOTE — Discharge Summary (Signed)
[name] [place] OPF:[id number] DOB: [birth year] DOA: 05/23/2017  PCP: Cassandria Anger, MD  Admit date: 05/23/2017  Discharge date: 05/26/2017  Admitted From: Home   Disposition:  Home   Recommendations for Outpatient Follow-up:   Follow up with PCP in 1-2 weeks  PCP Please obtain BMP/CBC, 2 view CXR in 1week,  (see Discharge instructions)   PCP Please follow up on the following pending results: None   Home Health: PT,RN   Equipment/Devices: None  Consultations: Opthalmology Discharge Condition: Stable   CODE STATUS: Full   Diet Recommendation:  Heart Healthy Low Carb   Chief Complaint - Eye Infection  Brief history of present illness from the day of admission and additional interim summary    [name] a 57 y.o.femalewithhistory of sarcoidosis on Humira, pulmonary embolism on Xarelto, diabetes mellitus type 2, diastolic dysfunction, hypertension, chronic kidney disease stage II has been experiencing increasing pain and discharge over the right eye mostly in the medial aspect over the last 4 days which has been worsening with increasing discharge. Denies any fever chills. Had a similar episode around 3 to 4 weeks ago and was treated with doxycycline following which symptoms improved, and the ER work-up suggestive of right-sided acute dacryocystitis with reactive retropharyngeal lymphadenopathy, she was seen by ophthalmology and requested to be admitted for IV antibiotics for a few days.                                                                   Hospital Course    1.  Acute right-sided dacryocystitis with reactive preseptal cellulitis and retropharyngeal lymphadenopathy.  Seen by ophthalmologist Dr. Alanda Slim, he was placed here on empiric broad-spectrum IV vancomycin and Zosyn,  with complete resolution of symptoms, on exam cellulitis is almost completely resolved, she feels better and is symptom-free, so far cultures are negative, I discussed the case with Dr. Alanda Slim ophthalmologist today over the phone she will be given 7 days of oral antibiotics which will be Bactrim and Augmentin with outpatient discharge and follow with PCP in ophthalmology. She will require surgical removal of her right lacrimal gland sometime in the next 6-8 weeks.  2.  History of PE and DVT 2 separate occasions.   she will resume xaralto I discussed the case with ophthalmologist Dr. Alanda Slim her surgery will be likely in the next 6-8 weeks.  3.  DM type II.  Continue Januvia and sliding scale.  Metformin  To be resumed tomorrow.  4.  History of obstructive sleep apnea.  CPAP nightly.  5.  History of asthma.  Stable supportive care.  6.  HX of chronic diastolic CHF EF 38%.  Currently compensated.  Continue home dose Lasix.  7.  History of sarcoidosis.  On Humira per her primary dermatologist.  No acute issues.  8.  EKG 2.  Stable.  9.  Essential hypertension.  Stable on Coreg.    Discharge diagnosis     Principal Problem:   Preseptal cellulitis Active Problems:   Essential hypertension   Diabetes mellitus type 2 with complications (HCC)   Pulmonary embolism (HCC)   OSA (obstructive sleep apnea)   Cellulitis    Discharge instructions    Discharge Instructions    Diet - low sodium heart healthy   Complete by:  As directed    Discharge instructions   Complete by:  As directed    Follow with Primary MD Plotnikov, Evie Lacks, MD in 4 days , keep face clean and dry at all times.  Get CBC, CMP, 2 view Chest X ray checked  by Primary MD  in 4 days   Activity: As tolerated with Full fall precautions use walker/cane & assistance as needed  Disposition Home   Diet:   Heart Healthy    For Heart failure patients - Check your Weight same time everyday, if you gain over 2  pounds, or you develop in leg swelling, experience more shortness of breath or chest pain, call your Primary MD immediately. Follow Cardiac Low Salt Diet and 1.5 lit/day fluid restriction.  Special Instructions: If you have smoked or chewed Tobacco  in the last 2 yrs please stop smoking, stop any regular Alcohol  and or any Recreational drug use.  On your next visit with your primary care physician please Get Medicines reviewed and adjusted.  Please request your Prim.MD to go over all Hospital Tests and Procedure/Radiological results at the follow up, please get all Hospital records sent to your Prim MD by signing hospital release before you go home.  If you experience worsening of your admission symptoms, develop shortness of breath, life threatening emergency, suicidal or homicidal thoughts you must seek medical attention immediately by calling 911 or calling your MD immediately  if symptoms less severe.  You Must read complete instructions/literature along with all the possible adverse reactions/side effects for all the Medicines you take and that have been prescribed to you. Take any new Medicines after you have completely understood and accpet all the possible adverse reactions/side effects.   Increase activity slowly   Complete by:  As directed       Discharge Medications   Allergies as of 05/26/2017      Reactions   Vancomycin Itching, Rash, Other (See Comments)   Patient had an allergic reaction that required Benadryl   Belsomra [suvorexant] Other (See Comments)   [place] out of bed while asleep: "I'm waking up as I'm falling on the floor;" "Night terrors"   Doxycycline Diarrhea, Nausea And Vomiting   Enalapril Maleate Cough   Hydrochlorothiazide Other (See Comments)   Low potassium levels    Hydroxychloroquine Sulfate Other (See Comments)   Vision changes    Iodine Other (See Comments)   Mother was intolerant--CODED on CT table---pt never tried   Latex Hives   Lyrica  [pregabalin] Other (See Comments)   "Made me feel high"   Nickel Other (See Comments)   Blisters  Pt has a titanium right and left knee - nickel causes skin irritations that form into blisters and sores   Other Other (See Comments)   Patient has Sarcoidosis and can't tolerate any metals   Tape Other (See Comments)   Medical tape causes bruising      Medication List    TAKE these medications   amoxicillin-clavulanate 500-125 MG tablet  Commonly known as:  AUGMENTIN Take 1 tablet (500 mg total) by mouth 2 (two) times daily.   carvedilol 12.5 MG tablet Commonly known as:  COREG TAKE 1 TABLET BY MOUTH TWICE DAILY WITH A MEAL   chlorhexidine 0.12 % solution Commonly known as:  PERIDEX Use as directed 5 mLs in the mouth or throat 2 (two) times daily. Pt is to swish and spit.   clobetasol ointment 0.05 % Commonly known as:  TEMOVATE Apply 1 application topically See admin instructions. Apply to the scalp two to three times a day (Lichen planus)   clonazePAM 0.5 MG tablet Commonly known as:  KLONOPIN Take 1-2 tablets BY MOUTH at bedtime What changed:    how much to take  how to take this  when to take this   esomeprazole 40 MG capsule Commonly known as:  NEXIUM Take 1 capsule (40 mg total) by mouth daily before breakfast.   freestyle lancets 1 each.   furosemide 40 MG tablet Commonly known as:  LASIX TAKE 1 TABLET BY MOUTH EVERY MORNING FOR EDEMA   gabapentin 300 MG capsule Commonly known as:  NEURONTIN Take 1 capsule (300 mg total) by mouth 2 (two) times daily as needed (for pain).   HUMIRA 40 MG/0.8ML Pskt Generic drug:  Adalimumab Inject 40 mg into the skin every 7 (seven) days.   metFORMIN 500 MG 24 hr tablet Commonly known as:  GLUCOPHAGE-XR TAKE 2 TABLETS(1000 MG) BY MOUTH DAILY WITH BREAKFAST Start taking on:  05/27/2017   potassium chloride 10 MEQ tablet Commonly known as:  KLOR-CON 10 Take 1 tablet (10 mEq total) by mouth daily.   sitaGLIPtin 50 MG  tablet Commonly known as:  JANUVIA Take 1 tablet (50 mg total) by mouth daily.   sulfamethoxazole-trimethoprim 800-160 MG tablet Commonly known as:  BACTRIM DS,SEPTRA DS Take 1 tablet by mouth 2 (two) times daily.   SYMBICORT 160-4.5 MCG/ACT inhaler Generic drug:  budesonide-formoterol Inhale 2 puffs into the lungs TWICE DAILY 120/4=30   topiramate 50 MG tablet Commonly known as:  TOPAMAX Take 1 tablet (50 mg total) by mouth 2 (two) times daily.   venlafaxine XR 75 MG 24 hr capsule Commonly known as:  EFFEXOR-XR Take 1 capsule (75 mg total) by mouth daily with breakfast.   VENTOLIN HFA 108 (90 Base) MCG/ACT inhaler Generic drug:  albuterol Inhale 2 puffs into the lungs every 6 (six) hours as needed for wheezing or shortness of breath. 200/8=25   VOLTAREN 1 % Gel Generic drug:  diclofenac sodium Apply 2 g topically 2 (two) times daily as needed (for pain). 100/4=25   XARELTO 20 MG Tabs tablet Generic drug:  rivaroxaban Take 1 tablet (20 mg total) by mouth daily. with food What changed:    when to take this  additional instructions       Follow-up Information    Plotnikov, Evie Lacks, MD. Schedule an appointment as soon as possible for a visit in 3 day(s).   Specialty:  Internal Medicine Contact information: [place] [address] [phone]        Awanda Mink, MD. Schedule an appointment as soon as possible for a visit in 1 week(s).   Specialty:  Ophthalmology Contact information: [place] [address] [phone]           Major procedures and Radiology Reports - PLEASE review detailed and final reports thoroughly  -        Dg Chest 2 View  Result  Date: 05/23/2017 CLINICAL DATA:  Facial cellulitis EXAM: CHEST - 2 VIEW COMPARISON:  12/08/2016 chest radiograph. FINDINGS: Stable cardiomediastinal silhouette with normal heart size. No pneumothorax. No pleural effusion. Mild reticulonodular opacities at the  right greater than left lung apices, not appreciably changed. Mild reticular opacities at the right lung base, unchanged. No pulmonary edema. No acute consolidative airspace disease. IMPRESSION: No acute cardiopulmonary disease. Stable biapical reticulonodular and right basilar reticular opacities, attributed to sarcoidosis on 07/21/2016 chest CT. Electronically Signed   By: Ilona Sorrel M.D.   On: 05/23/2017 10:12   Ct Soft Tissue Neck W Contrast  Result Date: 05/23/2017 CLINICAL DATA:  57 year old female with right orbit face and neck cellulitis. Sarcoidosis. EXAM: CT NECK WITH CONTRAST TECHNIQUE: Multidetector CT imaging of the neck was performed using the standard protocol following the bolus administration of intravenous contrast. CONTRAST:  151mL OMNIPAQUE IOHEXOL 300 MG/ML  SOLN COMPARISON:  Brain MRI 05/07/2016. Head CT without contrast 05/06/2013. Cervical spine CT without contrast 03/07/2009. chest CTA 07/21/2016. FINDINGS: Pharynx and larynx: Larynx soft tissue contours are within normal limits. The hypopharynx is within normal limits. At the soft palate and nasopharynx there is widespread uniform pharyngeal mucosal space hyperenhancement (series 3, image 50). Retained secretions in the nasopharynx. Negative parapharyngeal spaces. The retropharyngeal space is remarkable for mildly enlarged bilateral retropharyngeal lymph nodes measuring 7-9 millimeters in thickness (series 3, images 54 and 50), as well as a retropharyngeal course of the right carotid. No retropharyngeal fluid. Salivary glands: Negative sublingual space. Submandibular glands and parotid glands are within normal limits. Thyroid: Mild thyromegaly.  No discrete thyroid nodule or mass. Lymph nodes: Enlarged retropharyngeal lymph nodes are described above. The other lymph node spaces throughout the neck appear normal. No cystic or necrotic nodes. Vascular: The major vascular structures in the neck and at the skull base are patent. There is  a retropharyngeal course of the right carotid. The left carotid has much less of a retropharyngeal course today versus that seen on the 2011 CT. Calcified atherosclerosis at the skull base. Limited intracranial: Negative visualized brain parenchyma. Visualized orbits: There is confluent abnormal soft tissue thickening in the right preseptal space, especially within the medial aspect of the orbit. Centrally within the abnormal preseptal soft tissue there is tubular hypodensity which appears contiguous with the right lacrimal sac and nasolacrimal duct (series 3, image 30 and coronal series 6 images 29 through 35). Furthermore, there was a small simple appearing cystic lesion at the right lacrimal sac on the 2018 MRI (9-10 millimeters at that time, series 5, image 9 of that study). The postseptal orbit soft tissues remain normal. Inflammation tracks into the right premalar space. The left orbit is normal. Mastoids and visualized paranasal sinuses: There is a perforated anterior nasal septum. This was not evident in 2018. There are retained secretions throughout the nasal cavity, greater on the left, and continuing into the nasopharynx. There is only trace paranasal sinus mucosal thickening, primarily within the right maxillary sinus. The bilateral tympanic cavities are clear. There are mild bilateral mastoid effusions which appears stable since 2018. Skeleton: Absent maxillary and posterior mandible dentition. The facial bones appear stable and intact. Mild for age cervical spine degeneration. No acute osseous abnormality identified. Upper chest: Lymph nodes there are scattered calcified mediastinal. There is partially visible bilateral pulmonary perihilar opacity. The visible upper chest appears stable since 2018. IMPRESSION: 1. Right preseptal soft tissue thickening and/or inflammation with low-density material contiguous with the right nasolacrimal duct. This is suggestive of  a complicated Acute Infectious  Dacryocystitis with preseptal cellulitis. However, there was a small cystic lesion of the right lacrimal sac in 2018, and rarely sarcoidosis can directly involve the nasolacrimal duct. See also #2. No postseptal involvement. 2. Anterior nasal septal perforation which may be new or progressed from the prior studies. Superimposed acute inflammation of the nasal cavity and mucositis type appearance of the nasopharynx with associated mild retropharyngeal lymphadenopathy which appears reactive. Again, top considerations are Acute Infection and Sarcoidosis. 3. Negative neck soft tissues otherwise. 4. Stable chronic sarcoidosis sequelae in the visible upper chest. Electronically Signed   By: Genevie Ann M.D.   On: 05/23/2017 19:35    Micro Results     Recent Results (from the past 240 hour(s))  Blood culture (routine x 2)     Status: None (Preliminary result)   Collection Time: 05/23/17  5:10 PM  Result Value Ref Range Status   Specimen Description BLOOD RIGHT ANTECUBITAL  Final   Special Requests   Final    BOTTLES DRAWN AEROBIC AND ANAEROBIC Blood Culture adequate volume   Culture   Final    NO GROWTH 2 DAYS Performed at [facility], [place] [address], [place], [place] [address]    Report Status PENDING  Incomplete  Blood culture (routine x 2)     Status: None (Preliminary result)   Collection Time: 05/23/17  5:17 PM  Result Value Ref Range Status   Specimen Description BLOOD RIGHT HAND  Final   Special Requests   Final    BOTTLES DRAWN AEROBIC AND ANAEROBIC Blood Culture adequate volume   Culture   Final    NO GROWTH 2 DAYS Performed at [facility], [place] [address] [place], [place] [address]    Report Status PENDING  Incomplete    Today   Subjective    Jheri Mitter today has no headache,no chest abdominal pain,no new weakness tingling or numbness, feels much better wants to go home today.     Objective   Blood pressure 133/66, pulse 66, temperature (!) 97.5 F (36.4  C), temperature source Oral, resp. rate 17, height 5\' 7"  (1.702 m), weight 125.2 kg (276 lb 1.7 oz), SpO2 98 %.   Intake/Output Summary (Last 24 hours) at 05/26/2017 1050 Last data filed at 05/26/2017 1018 Gross per 24 hour  Intake 1260.93 ml  Output 1401 ml  Net -140.07 ml    Exam  Awake Alert, Oriented X 3, No new F.N deficits, Normal affect Reynolds.AT,PERRAL, right periorbital area cellulitis with small right nasolabial open wound currently not draining - area improved Supple Neck,No JVD, No cervical lymphadenopathy appriciated.  Symmetrical Chest wall movement, Good air movement bilaterally, CTAB RRR,No Gallops,Rubs or new Murmurs, No Parasternal Heave +ve B.Sounds, Abd Soft, No tenderness, No organomegaly appriciated, No rebound - guarding or rigidity. No Cyanosis, Clubbing or edema, No new Rash or bruise       Data Review   CBC w Diff:  Lab Results  Component Value Date   WBC 6.7 05/26/2017   HGB 10.1 (L) 05/26/2017   HGB 13.4 12/06/2013   HCT 34.5 (L) 05/26/2017   HCT 43.0 12/06/2013   PLT 196 05/26/2017   PLT 216 12/06/2013   LYMPHOPCT 26 05/23/2017   LYMPHOPCT 28.8 12/06/2013   MONOPCT 10 05/23/2017   MONOPCT 9.3 12/06/2013   EOSPCT 2 05/23/2017   EOSPCT 2.0 12/06/2013   BASOPCT 0 05/23/2017   BASOPCT 0.3 12/06/2013    CMP:  Lab Results  Component Value Date   NA 139 05/24/2017   NA 144 07/19/2015   NA 140 12/06/2013   K 4.6 05/24/2017   K 3.7 12/06/2013   CL 109 05/24/2017   CO2 21 (L) 05/24/2017   CO2 24 12/06/2013   BUN 12 05/24/2017   BUN 12 07/19/2015   BUN 11.0 12/06/2013   CREATININE 1.04 (H) 05/24/2017   CREATININE 1.1 12/06/2013   GLU 98 07/19/2015   PROT 7.2 05/23/2017   PROT 6.6 12/06/2013   ALBUMIN 3.0 (L) 05/23/2017   ALBUMIN 3.0 (L) 12/06/2013   BILITOT 0.5 05/23/2017   BILITOT 0.34 12/06/2013   ALKPHOS 105 05/23/2017   ALKPHOS 63 12/06/2013   AST 27 05/23/2017   AST 29 12/06/2013   ALT 19 05/23/2017   ALT 22 12/06/2013   .   Total Time in preparing paper work, data evaluation and todays exam - 25 minutes  Lala Lund M.D on 05/26/2017 at 10:50 AM  [facility]   Office  [phone]

## 2017-05-26 NOTE — Discharge Instructions (Signed)
Follow with Primary MD Plotnikov, Evie Lacks, MD in 4 days , keep face clean and dry at all times.  Get CBC, CMP, 2 view Chest X ray checked by Primary MD in 4 days   Activity: As tolerated with Full fall precautions use walker/cane & assistance as needed  Disposition Home   Diet:   Heart Healthy    For Heart failure patients - Check your Weight same time everyday, if you gain over 2 pounds, or you develop in leg swelling, experience more shortness of breath or chest pain, call your Primary MD immediately. Follow Cardiac Low Salt Diet and 1.5 lit/day fluid restriction.  Special Instructions: If you have smoked or chewed Tobacco  in the last 2 yrs please stop smoking, stop any regular Alcohol  and or any Recreational drug use.  On your next visit with your primary care physician please Get Medicines reviewed and adjusted.  Please request your Prim.MD to go over all Hospital Tests and Procedure/Radiological results at the follow up, please get all Hospital records sent to your Prim MD by signing hospital release before you go home.  If you experience worsening of your admission symptoms, develop shortness of breath, life threatening emergency, suicidal or homicidal thoughts you must seek medical attention immediately by calling 911 or calling your MD immediately  if symptoms less severe.  You Must read complete instructions/literature along with all the possible adverse reactions/side effects for all the Medicines you take and that have been prescribed to you. Take any new Medicines after you have completely understood and accpet all the possible adverse reactions/side effects.

## 2017-05-27 ENCOUNTER — Encounter (HOSPITAL_COMMUNITY): Payer: Medicare Other

## 2017-05-27 NOTE — Unmapped (Signed)
Patient has been approved to receive HUMIRA from the manufacturer from  11/18/16 until 01/13/18 and will be shipped directly to the patient's home.

## 2017-05-28 DIAGNOSIS — F339 Major depressive disorder, recurrent, unspecified: Secondary | ICD-10-CM | POA: Diagnosis not present

## 2017-05-28 DIAGNOSIS — I130 Hypertensive heart and chronic kidney disease with heart failure and stage 1 through stage 4 chronic kidney disease, or unspecified chronic kidney disease: Secondary | ICD-10-CM | POA: Diagnosis not present

## 2017-05-28 DIAGNOSIS — E1122 Type 2 diabetes mellitus with diabetic chronic kidney disease: Secondary | ICD-10-CM | POA: Diagnosis not present

## 2017-05-28 DIAGNOSIS — D519 Vitamin B12 deficiency anemia, unspecified: Secondary | ICD-10-CM | POA: Diagnosis not present

## 2017-05-28 DIAGNOSIS — Z9181 History of falling: Secondary | ICD-10-CM | POA: Diagnosis not present

## 2017-05-28 DIAGNOSIS — D869 Sarcoidosis, unspecified: Secondary | ICD-10-CM | POA: Diagnosis not present

## 2017-05-28 DIAGNOSIS — N182 Chronic kidney disease, stage 2 (mild): Secondary | ICD-10-CM | POA: Diagnosis not present

## 2017-05-28 DIAGNOSIS — J9611 Chronic respiratory failure with hypoxia: Secondary | ICD-10-CM | POA: Diagnosis not present

## 2017-05-28 DIAGNOSIS — Z86718 Personal history of other venous thrombosis and embolism: Secondary | ICD-10-CM | POA: Diagnosis not present

## 2017-05-28 DIAGNOSIS — J449 Chronic obstructive pulmonary disease, unspecified: Secondary | ICD-10-CM | POA: Diagnosis not present

## 2017-05-28 DIAGNOSIS — Z86711 Personal history of pulmonary embolism: Secondary | ICD-10-CM | POA: Diagnosis not present

## 2017-05-28 DIAGNOSIS — F419 Anxiety disorder, unspecified: Secondary | ICD-10-CM | POA: Diagnosis not present

## 2017-05-28 DIAGNOSIS — I2510 Atherosclerotic heart disease of native coronary artery without angina pectoris: Secondary | ICD-10-CM | POA: Diagnosis not present

## 2017-05-28 DIAGNOSIS — Z7984 Long term (current) use of oral hypoglycemic drugs: Secondary | ICD-10-CM | POA: Diagnosis not present

## 2017-05-28 DIAGNOSIS — K219 Gastro-esophageal reflux disease without esophagitis: Secondary | ICD-10-CM | POA: Diagnosis not present

## 2017-05-28 DIAGNOSIS — Z96653 Presence of artificial knee joint, bilateral: Secondary | ICD-10-CM | POA: Diagnosis not present

## 2017-05-28 DIAGNOSIS — I5030 Unspecified diastolic (congestive) heart failure: Secondary | ICD-10-CM | POA: Diagnosis not present

## 2017-05-28 DIAGNOSIS — Z792 Long term (current) use of antibiotics: Secondary | ICD-10-CM | POA: Diagnosis not present

## 2017-05-28 DIAGNOSIS — M069 Rheumatoid arthritis, unspecified: Secondary | ICD-10-CM | POA: Diagnosis not present

## 2017-05-28 DIAGNOSIS — G4733 Obstructive sleep apnea (adult) (pediatric): Secondary | ICD-10-CM | POA: Diagnosis not present

## 2017-05-28 DIAGNOSIS — L03213 Periorbital cellulitis: Secondary | ICD-10-CM | POA: Diagnosis not present

## 2017-05-28 DIAGNOSIS — Z8673 Personal history of transient ischemic attack (TIA), and cerebral infarction without residual deficits: Secondary | ICD-10-CM | POA: Diagnosis not present

## 2017-05-28 DIAGNOSIS — H04301 Unspecified dacryocystitis of right lacrimal passage: Secondary | ICD-10-CM | POA: Diagnosis not present

## 2017-05-28 DIAGNOSIS — Z87891 Personal history of nicotine dependence: Secondary | ICD-10-CM | POA: Diagnosis not present

## 2017-05-28 DIAGNOSIS — Z7901 Long term (current) use of anticoagulants: Secondary | ICD-10-CM | POA: Diagnosis not present

## 2017-05-28 LAB — CULTURE, BLOOD (ROUTINE X 2)
Culture: NO GROWTH
Culture: NO GROWTH
Special Requests: ADEQUATE
Special Requests: ADEQUATE

## 2017-05-29 ENCOUNTER — Encounter (HOSPITAL_COMMUNITY): Payer: Medicare Other

## 2017-05-29 ENCOUNTER — Other Ambulatory Visit: Payer: Self-pay | Admitting: Pharmacist

## 2017-05-30 NOTE — Patient Outreach (Addendum)
[facility]) Care Management  05/29/2017  [place] Aug 27, 1960 [id number]   Patient was called regarding medication reconciliation post discharge.  HIPAA identifiers were obtained. Patient said she was not at home and could not review her medications at the time of the call.  She did mention concern about getting her medications delivered to her.  She was using [facility] but they were bought out by [facility]. [facility] is not providing delivery service.  An account was set up for the patient at [facility].  Patient was concerned because they have a $3 per delivery fee.  She said [facility] not far from her home delivers but she was not sure of the details.  [facility] was called. The representative said patients can access the delivery fee online at [email or web address] or they can download the [facility] App and process an application.  In addition to the application, patients have to upload a credit card. They have a $4.99 per delivery fee but offer a "synching program" called "Save a Trip" for patient's with Medicare Part D to help them only have 1 delivery fee.   Patient says she has a follow up appointment with her PCP on Monday Jun 02, 2017.  Patient said she would attempt to complete the application with [facility]. She said she would be with her grandson during the day throughout this week.   Plan: Home visit scheduled for next Tuesday.  Elayne Guerin, PharmD, [place] Clinical Pharmacist [phone]

## 2017-06-02 ENCOUNTER — Ambulatory Visit (INDEPENDENT_AMBULATORY_CARE_PROVIDER_SITE_OTHER): Payer: Medicare Other | Admitting: Internal Medicine

## 2017-06-02 ENCOUNTER — Ambulatory Visit (INDEPENDENT_AMBULATORY_CARE_PROVIDER_SITE_OTHER)
Admission: RE | Admit: 2017-06-02 | Discharge: 2017-06-02 | Disposition: A | Discharge status: Home / Self Care | Payer: Medicare Other | Source: Ambulatory Visit | Attending: Internal Medicine | Admitting: Internal Medicine

## 2017-06-02 ENCOUNTER — Encounter: Payer: Self-pay | Admitting: Internal Medicine

## 2017-06-02 ENCOUNTER — Other Ambulatory Visit (INDEPENDENT_AMBULATORY_CARE_PROVIDER_SITE_OTHER): Payer: Medicare Other

## 2017-06-02 VITALS — BP 130/66 | HR 62 | Temp 98.0°F | Ht 67.0 in | Wt 274.0 lb

## 2017-06-02 DIAGNOSIS — D869 Sarcoidosis, unspecified: Secondary | ICD-10-CM

## 2017-06-02 DIAGNOSIS — E538 Deficiency of other specified B group vitamins: Secondary | ICD-10-CM | POA: Diagnosis not present

## 2017-06-02 DIAGNOSIS — E118 Type 2 diabetes mellitus with unspecified complications: Secondary | ICD-10-CM | POA: Diagnosis not present

## 2017-06-02 DIAGNOSIS — H05011 Cellulitis of right orbit: Secondary | ICD-10-CM

## 2017-06-02 DIAGNOSIS — H04301 Unspecified dacryocystitis of right lacrimal passage: Secondary | ICD-10-CM | POA: Diagnosis not present

## 2017-06-02 DIAGNOSIS — Z8709 Personal history of other diseases of the respiratory system: Secondary | ICD-10-CM | POA: Diagnosis not present

## 2017-06-02 LAB — CBC WITH DIFFERENTIAL/PLATELET
BASOS PCT: 0.3 % (ref 0.0–3.0)
Basophils Absolute: 0 10*3/uL (ref 0.0–0.1)
EOS PCT: 2.3 % (ref 0.0–5.0)
Eosinophils Absolute: 0.2 10*3/uL (ref 0.0–0.7)
HEMATOCRIT: 37.8 % (ref 36.0–46.0)
Hemoglobin: 11.8 g/dL — ABNORMAL LOW (ref 12.0–15.0)
LYMPHS ABS: 2.9 10*3/uL (ref 0.7–4.0)
LYMPHS PCT: 33.7 % (ref 12.0–46.0)
MCHC: 31.3 g/dL (ref 30.0–36.0)
MCV: 83.7 fl (ref 78.0–100.0)
MONOS PCT: 7.5 % (ref 3.0–12.0)
Monocytes Absolute: 0.6 10*3/uL (ref 0.1–1.0)
NEUTROS ABS: 4.9 10*3/uL (ref 1.4–7.7)
NEUTROS PCT: 56.2 % (ref 43.0–77.0)
PLATELETS: 334 10*3/uL (ref 150.0–400.0)
RBC: 4.51 Mil/uL (ref 3.87–5.11)
RDW: 20.5 % — ABNORMAL HIGH (ref 11.5–15.5)
WBC: 8.7 10*3/uL (ref 4.0–10.5)

## 2017-06-02 LAB — BASIC METABOLIC PANEL
BUN: 19 mg/dL (ref 6–23)
CALCIUM: 9.5 mg/dL (ref 8.4–10.5)
CHLORIDE: 106 meq/L (ref 96–112)
CO2: 24 mEq/L (ref 19–32)
CREATININE: 1.14 mg/dL (ref 0.40–1.20)
GFR: 52.24 mL/min — AB (ref >60.00)
Glucose, Bld: 105 mg/dL — ABNORMAL HIGH (ref 70–99)
Potassium: 4.6 mEq/L (ref 3.5–5.1)
Sodium: 138 mEq/L (ref 135–145)

## 2017-06-02 NOTE — Assessment & Plan Note (Signed)
CXR

## 2017-06-02 NOTE — Progress Notes (Signed)
Subjective:  Patient ID: [name], female    DOB: [birth year]  Age: 57 y.o. MRN: [id number]  CC: No chief complaint on file.   HPI [name] presents for post-hosp f/u for dacroystitis and cellulitis - d/c'd on 5/13. Notes/tests reviewed. The pt finished abx today   Per Hx: "1. Acute right-sided dacryocystitis with reactive preseptal cellulitis and retropharyngeal lymphadenopathy. Seen by ophthalmologist Dr. Alanda Slim, he was placed here on empiric broad-spectrum IV vancomycin and Zosyn, with complete resolution of symptoms, on exam cellulitis is almost completely resolved, she feels better and is symptom-free, so far cultures are negative, I discussed the case with Dr. Alanda Slim ophthalmologist today over the phone she will be given 7 days of oral antibiotics which will be Bactrim and Augmentin with outpatient discharge and follow with PCP in ophthalmology. She will require surgical removal of her right lacrimal gland sometime in the next 6-8 weeks.  2. History of PE and DVT 2 separate occasions.  she will resume xaralto I discussed the case with ophthalmologist Dr. Alanda Slim her surgery will be likely in the next 6-8 weeks.  3. DM type II. Continue Januvia and sliding scale. Metformin  To be resumed tomorrow.  4. History of obstructive sleep apnea. CPAP nightly.  5. History of asthma. Stable supportive care.  6. HX of chronic diastolic CHF EF 06%. Currently compensated. Continue home dose Lasix.  7. History of sarcoidosis. On Humira per her primary dermatologist. No acute issues.  8. EKG 2. Stable."     Outpatient Medications Prior to Visit  Medication Sig Dispense Refill  . Adalimumab (HUMIRA) 40 MG/0.8ML PSKT Inject 40 mg into the skin every 7 (seven) days.     . carvedilol (COREG) 12.5 MG tablet TAKE 1 TABLET BY MOUTH TWICE DAILY WITH A MEAL 180 tablet 1  . chlorhexidine (PERIDEX) 0.12 % solution Use as directed 5 mLs in the mouth  or throat 2 (two) times daily. Pt is to swish and spit. 120 mL 0  . clobetasol ointment (TEMOVATE) 3.01 % Apply 1 application topically See admin instructions. Apply to the scalp two to three times a day (Lichen planus)    . clonazePAM (KLONOPIN) 0.5 MG tablet Take 1-2 tablets BY MOUTH at bedtime (Patient taking differently: Take 0.5 mg by mouth at bedtime) 60 tablet 2  . esomeprazole (NEXIUM) 40 MG capsule Take 1 capsule (40 mg total) by mouth daily before breakfast. 90 capsule 3  . furosemide (LASIX) 40 MG tablet TAKE 1 TABLET BY MOUTH EVERY MORNING FOR EDEMA 90 tablet 1  . gabapentin (NEURONTIN) 300 MG capsule Take 1 capsule (300 mg total) by mouth 2 (two) times daily as needed (for pain). 180 capsule 1  . Lancets (FREESTYLE) lancets 1 each.    . metFORMIN (GLUCOPHAGE-XR) 500 MG 24 hr tablet TAKE 2 TABLETS(1000 MG) BY MOUTH DAILY WITH BREAKFAST 1 tablet 0  . potassium chloride (KLOR-CON 10) 10 MEQ tablet Take 1 tablet (10 mEq total) by mouth daily. 90 tablet 3  . sitaGLIPtin (JANUVIA) 50 MG tablet Take 1 tablet (50 mg total) by mouth daily. 90 tablet 0  . sulfamethoxazole-trimethoprim (BACTRIM DS,SEPTRA DS) 800-160 MG tablet Take 1 tablet by mouth 2 (two) times daily. 12 tablet 0  . SYMBICORT 160-4.5 MCG/ACT inhaler Inhale 2 puffs into the lungs TWICE DAILY 120/4=30 10.2 g 5  . topiramate (TOPAMAX) 50 MG tablet Take 1 tablet (50 mg total) by mouth 2 (two) times daily. 180 tablet 1  . venlafaxine XR (EFFEXOR-XR)  75 MG 24 hr capsule Take 1 capsule (75 mg total) by mouth daily with breakfast. 30 capsule 11  . VENTOLIN HFA 108 (90 Base) MCG/ACT inhaler Inhale 2 puffs into the lungs every 6 (six) hours as needed for wheezing or shortness of breath. 200/8=25 18 g 11  . VOLTAREN 1 % GEL Apply 2 g topically 2 (two) times daily as needed (for pain). 100/4=25 100 g 2  . XARELTO 20 MG TABS tablet Take 1 tablet (20 mg total) by mouth daily. with food (Patient taking differently: Take 20 mg by mouth at  bedtime. ) 90 tablet 0  . amoxicillin-clavulanate (AUGMENTIN) 500-125 MG tablet Take 1 tablet (500 mg total) by mouth 2 (two) times daily. 12 tablet 0   No facility-administered medications prior to visit.     ROS Review of Systems  Constitutional: Negative for activity change, appetite change, chills, fatigue, fever and unexpected weight change.  HENT: Negative for congestion, mouth sores and sinus pressure.   Eyes: Negative for visual disturbance.  Respiratory: Negative for cough and chest tightness.   Gastrointestinal: Negative for abdominal pain and nausea.  Genitourinary: Negative for difficulty urinating, frequency and vaginal pain.  Musculoskeletal: Positive for back pain and gait problem.  Skin: Positive for color change, rash and wound. Negative for pallor.  Neurological: Negative for dizziness, tremors, weakness, numbness and headaches.  Psychiatric/Behavioral: Positive for dysphoric mood. Negative for confusion, sleep disturbance and suicidal ideas. The patient is nervous/anxious.     Objective:  BP 130/66 (BP Location: Left Arm, Patient Position: Sitting, Cuff Size: Large)   Pulse 62   Temp 98 F (36.7 C) (Oral)   Ht 5\' 7"  (1.702 m)   Wt 274 lb (124.3 kg)   SpO2 98% Comment: 2 liters of o2  BMI 42.91 kg/m   BP Readings from Last 3 Encounters:  06/02/17 130/66  05/26/17 121/79  05/23/17 136/78    Wt Readings from Last 3 Encounters:  06/02/17 274 lb (124.3 kg)  05/24/17 276 lb 1.7 oz (125.2 kg)  05/23/17 276 lb (125.2 kg)    Physical Exam  Constitutional: She appears well-developed. No distress.  HENT:  Head: Normocephalic.  Right Ear: External ear normal.  Left Ear: External ear normal.  Nose: Nose normal.  Mouth/Throat: Oropharynx is clear and moist.  Eyes: Pupils are equal, round, and reactive to light. Conjunctivae are normal. Right eye exhibits no discharge. Left eye exhibits no discharge.  Neck: Normal range of motion. Neck supple. No JVD present.  No tracheal deviation present. No thyromegaly present.  Cardiovascular: Normal rate, regular rhythm and normal heart sounds.  Pulmonary/Chest: No stridor. No respiratory distress. She has no wheezes.  Abdominal: Soft. Bowel sounds are normal. She exhibits no distension and no mass. There is no tenderness. There is no rebound and no guarding.  Musculoskeletal: She exhibits tenderness. She exhibits no edema.  Lymphadenopathy:    She has no cervical adenopathy.  Neurological: She displays normal reflexes. No cranial nerve deficit. She exhibits normal muscle tone. Coordination abnormal.  Skin: No rash noted. There is erythema.  Psychiatric: She has a normal mood and affect. Her behavior is normal. Judgment and thought content normal.   LS tender residual R eye skin redness/scab walker  Lab Results  Component Value Date   WBC 6.7 05/26/2017   HGB 10.1 (L) 05/26/2017   HCT 34.5 (L) 05/26/2017   PLT 196 05/26/2017   GLUCOSE 120 (H) 05/24/2017   CHOL 193 06/29/2008   TRIG 145.0 06/29/2008  HDL 67.50 06/29/2008   LDLCALC 97 06/29/2008   ALT 19 05/23/2017   AST 27 05/23/2017   NA 139 05/24/2017   K 4.6 05/24/2017   CL 109 05/24/2017   CREATININE 1.04 (H) 05/24/2017   BUN 12 05/24/2017   CO2 21 (L) 05/24/2017   TSH 2.31 08/23/2016   INR 1.07 07/22/2016   HGBA1C 6.7 (H) 04/02/2017   MICROALBUR <0.7 02/01/2015    Dg Chest 2 View  Result Date: 05/23/2017 CLINICAL DATA:  Facial cellulitis EXAM: CHEST - 2 VIEW COMPARISON:  12/08/2016 chest radiograph. FINDINGS: Stable cardiomediastinal silhouette with normal heart size. No pneumothorax. No pleural effusion. Mild reticulonodular opacities at the right greater than left lung apices, not appreciably changed. Mild reticular opacities at the right lung base, unchanged. No pulmonary edema. No acute consolidative airspace disease. IMPRESSION: No acute cardiopulmonary disease. Stable biapical reticulonodular and right basilar reticular opacities,  attributed to sarcoidosis on 07/21/2016 chest CT. Electronically Signed   By: Ilona Sorrel M.D.   On: 05/23/2017 10:12   Ct Soft Tissue Neck W Contrast  Result Date: 05/23/2017 CLINICAL DATA:  57 year old female with right orbit face and neck cellulitis. Sarcoidosis. EXAM: CT NECK WITH CONTRAST TECHNIQUE: Multidetector CT imaging of the neck was performed using the standard protocol following the bolus administration of intravenous contrast. CONTRAST:  166mL OMNIPAQUE IOHEXOL 300 MG/ML  SOLN COMPARISON:  Brain MRI 05/07/2016. Head CT without contrast 05/06/2013. Cervical spine CT without contrast 03/07/2009. chest CTA 07/21/2016. FINDINGS: Pharynx and larynx: Larynx soft tissue contours are within normal limits. The hypopharynx is within normal limits. At the soft palate and nasopharynx there is widespread uniform pharyngeal mucosal space hyperenhancement (series 3, image 50). Retained secretions in the nasopharynx. Negative parapharyngeal spaces. The retropharyngeal space is remarkable for mildly enlarged bilateral retropharyngeal lymph nodes measuring 7-9 millimeters in thickness (series 3, images 54 and 50), as well as a retropharyngeal course of the right carotid. No retropharyngeal fluid. Salivary glands: Negative sublingual space. Submandibular glands and parotid glands are within normal limits. Thyroid: Mild thyromegaly.  No discrete thyroid nodule or mass. Lymph nodes: Enlarged retropharyngeal lymph nodes are described above. The other lymph node spaces throughout the neck appear normal. No cystic or necrotic nodes. Vascular: The major vascular structures in the neck and at the skull base are patent. There is a retropharyngeal course of the right carotid. The left carotid has much less of a retropharyngeal course today versus that seen on the 2011 CT. Calcified atherosclerosis at the skull base. Limited intracranial: Negative visualized brain parenchyma. Visualized orbits: There is confluent abnormal  soft tissue thickening in the right preseptal space, especially within the medial aspect of the orbit. Centrally within the abnormal preseptal soft tissue there is tubular hypodensity which appears contiguous with the right lacrimal sac and nasolacrimal duct (series 3, image 30 and coronal series 6 images 29 through 35). Furthermore, there was a small simple appearing cystic lesion at the right lacrimal sac on the 2018 MRI (9-10 millimeters at that time, series 5, image 9 of that study). The postseptal orbit soft tissues remain normal. Inflammation tracks into the right premalar space. The left orbit is normal. Mastoids and visualized paranasal sinuses: There is a perforated anterior nasal septum. This was not evident in 2018. There are retained secretions throughout the nasal cavity, greater on the left, and continuing into the nasopharynx. There is only trace paranasal sinus mucosal thickening, primarily within the right maxillary sinus. The bilateral tympanic cavities are clear. There are mild  bilateral mastoid effusions which appears stable since 2018. Skeleton: Absent maxillary and posterior mandible dentition. The facial bones appear stable and intact. Mild for age cervical spine degeneration. No acute osseous abnormality identified. Upper chest: Lymph nodes there are scattered calcified mediastinal. There is partially visible bilateral pulmonary perihilar opacity. The visible upper chest appears stable since 2018. IMPRESSION: 1. Right preseptal soft tissue thickening and/or inflammation with low-density material contiguous with the right nasolacrimal duct. This is suggestive of a complicated Acute Infectious Dacryocystitis with preseptal cellulitis. However, there was a small cystic lesion of the right lacrimal sac in 2018, and rarely sarcoidosis can directly involve the nasolacrimal duct. See also #2. No postseptal involvement. 2. Anterior nasal septal perforation which may be new or progressed from the  prior studies. Superimposed acute inflammation of the nasal cavity and mucositis type appearance of the nasopharynx with associated mild retropharyngeal lymphadenopathy which appears reactive. Again, top considerations are Acute Infection and Sarcoidosis. 3. Negative neck soft tissues otherwise. 4. Stable chronic sarcoidosis sequelae in the visible upper chest. Electronically Signed   By: Genevie Ann M.D.   On: 05/23/2017 19:35    Assessment & Plan:   There are no diagnoses linked to this encounter. I have discontinued Jeralene Huff. Teems's amoxicillin-clavulanate. I am also having her maintain her Adalimumab, potassium chloride, VENTOLIN HFA, chlorhexidine, carvedilol, furosemide, gabapentin, esomeprazole, SYMBICORT, clonazePAM, clobetasol ointment, topiramate, venlafaxine XR, XARELTO, freestyle, VOLTAREN, sitaGLIPtin, metFORMIN, and sulfamethoxazole-trimethoprim.  No orders of the defined types were placed in this encounter.    Follow-up: No follow-ups on file.  Walker Kehr, MD

## 2017-06-02 NOTE — Assessment & Plan Note (Signed)
BMET 

## 2017-06-02 NOTE — Assessment & Plan Note (Signed)
Labs CXR F/u w/Dr Alanda Slim

## 2017-06-02 NOTE — Assessment & Plan Note (Signed)
Finished po abx CBC

## 2017-06-02 NOTE — Assessment & Plan Note (Signed)
On B12 

## 2017-06-03 ENCOUNTER — Encounter (HOSPITAL_COMMUNITY): Payer: Medicare Other

## 2017-06-04 ENCOUNTER — Ambulatory Visit: Payer: Self-pay | Admitting: Pharmacist

## 2017-06-04 DIAGNOSIS — H04301 Unspecified dacryocystitis of right lacrimal passage: Secondary | ICD-10-CM | POA: Diagnosis not present

## 2017-06-04 DIAGNOSIS — H2513 Age-related nuclear cataract, bilateral: Secondary | ICD-10-CM | POA: Diagnosis not present

## 2017-06-05 ENCOUNTER — Encounter (HOSPITAL_COMMUNITY): Payer: Medicare Other

## 2017-06-05 ENCOUNTER — Telehealth: Payer: Self-pay | Admitting: Internal Medicine

## 2017-06-05 NOTE — Telephone Encounter (Signed)
Copied from [place] [id number]. Topic: Quick Communication - See Telephone Encounter >> Jun 05, 2017  4:23 PM Rutherford Nail, [place] wrote: CRM for notification. See Telephone encounter for: 06/05/17. Olivia Mackie, RN with [facility] calling and is needing verbal orders needed for nurse visits for the following: 1x a week for 1 week 2x a week for 1 week 1x a week for 7 weeks CB#: [phone]

## 2017-06-05 NOTE — Telephone Encounter (Signed)
Okay. Thank you.

## 2017-06-05 NOTE — Telephone Encounter (Signed)
Copied from [place] [phone]. Topic: General - Other >> Jun 05, 2017 10:49 AM Yvette Rack wrote: Reason for CRM: Nicolette with [facility] called with verbal orders for Physical Therapy 2 times a week for 4 weeks

## 2017-06-06 NOTE — Telephone Encounter (Signed)
Notified Nicolette w/MD response../lmb 

## 2017-06-06 NOTE — Telephone Encounter (Signed)
Verbals given, FYI 

## 2017-06-10 ENCOUNTER — Encounter (HOSPITAL_COMMUNITY): Payer: Medicare Other

## 2017-06-10 ENCOUNTER — Other Ambulatory Visit: Payer: Self-pay | Admitting: Pulmonary Disease

## 2017-06-10 DIAGNOSIS — H04301 Unspecified dacryocystitis of right lacrimal passage: Secondary | ICD-10-CM | POA: Diagnosis not present

## 2017-06-10 DIAGNOSIS — H04031 Chronic enlargement of right lacrimal gland: Secondary | ICD-10-CM | POA: Diagnosis not present

## 2017-06-10 DIAGNOSIS — Z01818 Encounter for other preprocedural examination: Secondary | ICD-10-CM | POA: Diagnosis not present

## 2017-06-10 MED ORDER — BUDESONIDE-FORMOTEROL FUMARATE 160-4.5 MCG/ACT IN AERO: INHALATION_SPRAY | RESPIRATORY_TRACT | 5 refills | Status: DC

## 2017-06-11 ENCOUNTER — Telehealth: Payer: Self-pay | Admitting: Internal Medicine

## 2017-06-11 ENCOUNTER — Other Ambulatory Visit: Payer: Self-pay | Admitting: Pharmacist

## 2017-06-11 ENCOUNTER — Telehealth: Payer: Self-pay | Admitting: Pulmonary Disease

## 2017-06-11 NOTE — Telephone Encounter (Signed)
Copied from [place] [phone]. Topic: Quick Communication - See Telephone Encounter >> Jun 11, 2017  9:38 AM Bea Graff, [place] wrote: CRM for notification. See Telephone encounter for: 06/11/17. Alwyn Ren a clinical pharmacist with [facility] calling regarding some of the pts prescriptions. XARELTO 20 MG TABS tablet, potassium chloride (KLOR-CON 10) 10 MEQ tablet, carvedilol (COREG) 12.5 MG tablet, gabapentin (NEURONTIN) 300 MG capsule,  metFORMIN (GLUCOPHAGE-XR) 500 MG 24 hr tablet, sitaGLIPtin (JANUVIA) 50 MG tablet and topiramate (TOPAMAX) 50 MG tablet. Pt is needing these prescriptions refilled and sent to [facility]. Also, Alwyn Ren states pt should the pt be taking Aspirin? CB#: [phone]

## 2017-06-11 NOTE — Telephone Encounter (Signed)
I have received the form.   Patient is scheduled to have a dacryocystorhinostomy on 06/19/17 at [facility] by Dr. Georjean Mode. Dr. Kristeen Miss is requesting that the patient hold her Xarelto 14 days before surgery. Caryl Pina from [place] wants to know if RA will be ok with her holding the Xarelto.   RA, please advise. I have the form in my look-at folder. Thanks!

## 2017-06-11 NOTE — Patient Outreach (Addendum)
[place] [facility]) Care Management  [place]   06/11/2017  [name] 04/24/1960 [id number]  Subjective: Home visit completed at the patient's home. HIPAA identifiers were obtained.  Patient is a 57 year old female with multiple medical conditions including but not limited to:  COPD, vitamin b 12 deficiency, sarcoidosis, CAD, allergic rhinitis, type 2 diabetes, history of DVT, hypertension, GERD, obesity, Pulmonary Embolism, and vitamin D deficiency.  Patient was recently hospitalized for right sided dacrocystitis/retropharyngeal lymphadenopathy.  Patient is schedule to have ophthalmic surgery 06/26/17.  Objective:   Encounter Medications: Outpatient Encounter Medications as of 06/11/2017  Medication Sig Note  . Adalimumab (HUMIRA) 40 MG/0.8ML PSKT Inject 40 mg into the skin every 7 (seven) days.  05/23/2017: Thursdays  . aspirin EC 81 MG tablet Take 81 mg by mouth daily.   . budesonide-formoterol (SYMBICORT) 160-4.5 MCG/ACT inhaler Inhale 2 puffs into the lungs TWICE DAILY 120/4=30   . carvedilol (COREG) 12.5 MG tablet TAKE 1 TABLET BY MOUTH TWICE DAILY WITH A MEAL   . chlorhexidine (PERIDEX) 0.12 % solution Use as directed 5 mLs in the mouth or throat 2 (two) times daily. Pt is to swish and spit.   . clobetasol ointment (TEMOVATE) 6.96 % Apply 1 application topically See admin instructions. Apply to the scalp two to three times a day (Lichen planus)   . clonazePAM (KLONOPIN) 0.5 MG tablet Take 1-2 tablets BY MOUTH at bedtime (Patient taking differently: Take 0.5 mg by mouth at bedtime)   . esomeprazole (NEXIUM) 40 MG capsule Take 1 capsule (40 mg total) by mouth daily before breakfast.   . furosemide (LASIX) 40 MG tablet TAKE 1 TABLET BY MOUTH EVERY MORNING FOR EDEMA   . gabapentin (NEURONTIN) 300 MG capsule Take 1 capsule (300 mg total) by mouth 2 (two) times daily as needed (for pain).   . metFORMIN (GLUCOPHAGE-XR) 500 MG 24 hr tablet TAKE 2 TABLETS(1000  MG) BY MOUTH DAILY WITH BREAKFAST   . potassium chloride (KLOR-CON 10) 10 MEQ tablet Take 1 tablet (10 mEq total) by mouth daily.   . sitaGLIPtin (JANUVIA) 50 MG tablet Take 1 tablet (50 mg total) by mouth daily.   [name] sulfamethoxazole-trimethoprim (BACTRIM DS,SEPTRA DS) 800-160 MG tablet Take 1 tablet by mouth 2 (two) times daily.   [name] topiramate (TOPAMAX) 50 MG tablet Take 1 tablet (50 mg total) by mouth 2 (two) times daily.   [name] venlafaxine XR (EFFEXOR-XR) 75 MG 24 hr capsule Take 1 capsule (75 mg total) by mouth daily with breakfast.   . VENTOLIN HFA 108 (90 Base) MCG/ACT inhaler Inhale 2 puffs into the lungs every 6 (six) hours as needed for wheezing or shortness of breath. 200/8=25   . VOLTAREN 1 % GEL Apply 2 g topically 2 (two) times daily as needed (for pain). 100/4=25   . XARELTO 20 MG TABS tablet Take 1 tablet (20 mg total) by mouth daily. with food (Patient taking differently: Take 20 mg by mouth at bedtime. )   . Lancets (FREESTYLE) lancets 1 each.   . [DISCONTINUED] potassium chloride (K-DUR,KLOR-CON) 10 MEQ tablet TAKE 1 TABLET BY MOUTH DAILY    No facility-administered encounter medications on file as of 06/11/2017.     Functional Status: In your present state of health, do you have any difficulty performing the following activities: 05/23/2017 01/15/2017  Hearing? N N  Vision? N N  Difficulty concentrating or making decisions? N N  Walking or climbing stairs? Y Y  Dressing or bathing? N  Y  Doing errands, shopping? Y Y  Conservation officer, nature and eating ? - N  Using the Toilet? - N  In the past six months, have you accidently leaked urine? - N  Do you have problems with loss of bowel control? - N  Managing your Medications? - N  Managing your Finances? - Y  Housekeeping or managing your Housekeeping? - Y  Some recent data might be hidden    Fall/Depression Screening: Fall Risk  02/17/2017 01/15/2017 12/19/2016  Falls in the past year? Yes Yes Yes  Number falls in past yr: 2 or more 2  or more 2 or more  Injury with Fall? Yes Yes Yes  Risk Factor Category  High Fall Risk High Fall Risk -  Risk for fall due to : History of fall(s);Medication side effect;Impaired balance/gait;Impaired mobility History of fall(s);Impaired balance/gait;Impaired mobility Impaired mobility  Follow up Falls evaluation completed;Education provided;Falls prevention discussed Education provided;Falls prevention discussed Falls prevention discussed   PHQ 2/9 Scores 02/17/2017 01/15/2017 12/19/2016 11/06/2016 10/24/2016 07/08/2016 06/26/2016  PHQ - 2 Score 2 1 5 2  0 1 1  PHQ- 9 Score 7 - 9 7 - - -      Assessment: Patient's medications were reviewed in her home.  ASSESSMENT: Date Discharged from Hospital: 05/26/2017 Date Medication Reconciliation Performed: 06/11/2017  New Medications at Discharge:  Amoxicillin/Clav  Sulfamethoxazole/Trimethoprim    Drugs sorted by system:  Neurologic/Psychologic: Gabapentin Clonazepam Venlafaxine XR   Cardiovascular: Aspirin Carvedilol Xarelto   Pulmonary/Allergy: Symbicort Ventolin HFA  Gastrointestinal: Esomeprazole   Endocrine: Metformin Januvia  Topical: Clobetasol ointment Voltaren Gel  Vitamins/Minerals: Potassium Chloride  Infectious Diseases: Sulfamethoxazole-trimethoprim (Amox/Clav was discontinued at PCP follow up)  Miscellaneous: Chlorhexidine   Gaps in therapy:  Patient with diabetes not on a statin  Medications to avoid in the elderly:  Drug interactions:  Aspirin/Xarelto- aspirin was not on the patient's medication list and was not on the discharge summary.  Patient had Aspirin 81mg  tablets in her possession and said she received them from Dr. Christen Bame.    Other issues noted:  Adherence:   Patient was out of the following medications:  Metformin Gabapentin Potassium Chloride Xarelto (had 2 tablets left) Januvia  Dr. Judeen Hammans office was called to request refills of the medications the patient was  out of as well as to verify if the patient is supposed to be on aspirin.   [place] was called to inquire about which medications they had ready for the patient and they reported:  Symbicort Voltaren Gel Esomeprazole  PLAN: [employer] Dr. Alain Marion Will follow up on delivery of the patient's medications within 3 business days.   Elayne Guerin, PharmD, [place] Clinical Pharmacist [phone]

## 2017-06-11 NOTE — Telephone Encounter (Signed)
Xarelto can be held prior to surgery - but 14 days too long Probable 3-4 days should be enough

## 2017-06-12 ENCOUNTER — Other Ambulatory Visit: Payer: Self-pay | Admitting: Pharmacist

## 2017-06-12 ENCOUNTER — Encounter (HOSPITAL_COMMUNITY): Payer: Medicare Other

## 2017-06-12 ENCOUNTER — Telehealth: Payer: Self-pay | Admitting: Pulmonary Disease

## 2017-06-12 ENCOUNTER — Ambulatory Visit: Payer: Self-pay | Admitting: Pharmacist

## 2017-06-12 DIAGNOSIS — D869 Sarcoidosis, unspecified: Secondary | ICD-10-CM | POA: Diagnosis not present

## 2017-06-12 DIAGNOSIS — L03213 Periorbital cellulitis: Secondary | ICD-10-CM | POA: Diagnosis not present

## 2017-06-12 DIAGNOSIS — I5030 Unspecified diastolic (congestive) heart failure: Secondary | ICD-10-CM | POA: Diagnosis not present

## 2017-06-12 DIAGNOSIS — H04301 Unspecified dacryocystitis of right lacrimal passage: Secondary | ICD-10-CM | POA: Diagnosis not present

## 2017-06-12 DIAGNOSIS — E1122 Type 2 diabetes mellitus with diabetic chronic kidney disease: Secondary | ICD-10-CM | POA: Diagnosis not present

## 2017-06-12 DIAGNOSIS — I130 Hypertensive heart and chronic kidney disease with heart failure and stage 1 through stage 4 chronic kidney disease, or unspecified chronic kidney disease: Secondary | ICD-10-CM | POA: Diagnosis not present

## 2017-06-12 MED ORDER — POTASSIUM CHLORIDE ER 10 MEQ PO TBCR: 10.0000 meq | EXTENDED_RELEASE_TABLET | Freq: Every day | ORAL | 3 refills | Status: DC

## 2017-06-12 MED ORDER — SITAGLIPTIN PHOSPHATE 50 MG PO TABS: 50.0000 mg | ORAL_TABLET | Freq: Every day | ORAL | 0 refills | Status: DC

## 2017-06-12 MED ORDER — CARVEDILOL 12.5 MG PO TABS: 12.5000 mg | ORAL_TABLET | Freq: Two times a day (BID) | ORAL | 3 refills | Status: DC

## 2017-06-12 MED ORDER — TOPIRAMATE 50 MG PO TABS: 50.0000 mg | ORAL_TABLET | Freq: Two times a day (BID) | ORAL | 1 refills | Status: DC

## 2017-06-12 MED ORDER — METFORMIN HCL ER 500 MG PO TB24: ORAL_TABLET | ORAL | 1 refills | Status: DC

## 2017-06-12 MED ORDER — GABAPENTIN 300 MG PO CAPS: 300.0000 mg | ORAL_CAPSULE | Freq: Two times a day (BID) | ORAL | 1 refills | Status: DC | PRN

## 2017-06-12 MED ORDER — XARELTO 20 MG PO TABS: 20.0000 mg | ORAL_TABLET | Freq: Every day | ORAL | 3 refills | Status: DC

## 2017-06-12 NOTE — Telephone Encounter (Signed)
RXs sent, Alwyn Ren notified that pt is supposed to take ASA 81 QD

## 2017-06-12 NOTE — Telephone Encounter (Signed)
See message from 5/29

## 2017-06-12 NOTE — Patient Outreach (Signed)
[place] [facility]) Care Management  06/12/2017  [name] [birth year] [id number]   Patient called and said her provider's office has still not called her medications in to [facility].  HIPAA identifiers were obtained. Patient was asked about calling the pharmacy as well as back to her provider's office. She called me back and said [facility] actually had several of her medications and she was going to have [employer] transfer the prescriptions.   Just before I had a chance to call [employer].  Dr. Judeen Hammans nurse called me back and said they had called all the patient's medications in to [facility].In addition, she confirmed the patient is supposed to be on aspirin.   Called patient back and left her a message.  Plan: Follow up with patient in 10-14 business days Consider closing case.  Elayne Guerin, PharmD, [place] Clinical Pharmacist [phone]

## 2017-06-13 ENCOUNTER — Telehealth: Payer: Self-pay | Admitting: Pulmonary Disease

## 2017-06-13 ENCOUNTER — Ambulatory Visit: Payer: Self-pay | Admitting: Pharmacist

## 2017-06-13 NOTE — Telephone Encounter (Signed)
Spoke with [place] with [facility]. She is aware of RA's recs. Nothing else needed at time of call.

## 2017-06-13 NOTE — Telephone Encounter (Signed)
Spoke with Caryl Pina, advised her that RA recommended that the patient hold the Xarelto 3-4 days prior vs holding it 14 days. She verbalized understanding. She wishes to have this in writing. Advised her I would send a letter over in a few minutes.   Nothing else needed at time of call.

## 2017-06-17 ENCOUNTER — Encounter (HOSPITAL_COMMUNITY): Payer: Medicare Other

## 2017-06-17 DIAGNOSIS — H04301 Unspecified dacryocystitis of right lacrimal passage: Secondary | ICD-10-CM | POA: Diagnosis not present

## 2017-06-17 DIAGNOSIS — I130 Hypertensive heart and chronic kidney disease with heart failure and stage 1 through stage 4 chronic kidney disease, or unspecified chronic kidney disease: Secondary | ICD-10-CM | POA: Diagnosis not present

## 2017-06-17 DIAGNOSIS — E1122 Type 2 diabetes mellitus with diabetic chronic kidney disease: Secondary | ICD-10-CM | POA: Diagnosis not present

## 2017-06-17 DIAGNOSIS — L03213 Periorbital cellulitis: Secondary | ICD-10-CM | POA: Diagnosis not present

## 2017-06-17 DIAGNOSIS — I5030 Unspecified diastolic (congestive) heart failure: Secondary | ICD-10-CM | POA: Diagnosis not present

## 2017-06-17 DIAGNOSIS — D869 Sarcoidosis, unspecified: Secondary | ICD-10-CM | POA: Diagnosis not present

## 2017-06-19 ENCOUNTER — Encounter (HOSPITAL_COMMUNITY): Payer: Medicare Other

## 2017-06-19 DIAGNOSIS — I5030 Unspecified diastolic (congestive) heart failure: Secondary | ICD-10-CM | POA: Diagnosis not present

## 2017-06-19 DIAGNOSIS — H04301 Unspecified dacryocystitis of right lacrimal passage: Secondary | ICD-10-CM | POA: Diagnosis not present

## 2017-06-19 DIAGNOSIS — E1122 Type 2 diabetes mellitus with diabetic chronic kidney disease: Secondary | ICD-10-CM | POA: Diagnosis not present

## 2017-06-19 DIAGNOSIS — D869 Sarcoidosis, unspecified: Secondary | ICD-10-CM | POA: Diagnosis not present

## 2017-06-19 DIAGNOSIS — I130 Hypertensive heart and chronic kidney disease with heart failure and stage 1 through stage 4 chronic kidney disease, or unspecified chronic kidney disease: Secondary | ICD-10-CM | POA: Diagnosis not present

## 2017-06-19 DIAGNOSIS — L03213 Periorbital cellulitis: Secondary | ICD-10-CM | POA: Diagnosis not present

## 2017-06-20 ENCOUNTER — Telehealth: Payer: Self-pay | Admitting: Internal Medicine

## 2017-06-20 ENCOUNTER — Other Ambulatory Visit: Payer: Self-pay | Admitting: Pharmacist

## 2017-06-20 NOTE — Patient Outreach (Signed)
[facility]) Care Management  06/20/2017  [place] 04/16/1960 [id number]   Patient was called to follow up on her refills. HIPAA identifiers were obtained.  Patient confirmed she received all of her medications. She said she has developed a dry hacking cough over the last few days. She is scheduled to have eye surgery next Thursday.  Due to her co-morbid conditions, patient was encouraged to call her PCP about the cough.  Once I suggested that she call her PCP, she shared that she is having a great deal anxiety about her upcoming eye surgery. I suggested she call her eye provider's office to see if they could answer any more questions to soothe her concerns.  Plan:  Follow up with patient after her eye surgery. Consider closing the patient's case.  Elayne Guerin, PharmD, [place] Clinical Pharmacist [phone]

## 2017-06-20 NOTE — Addendum Note (Signed)
Encounter addended by: Gaspar Bidding on: 06/20/2017 3:20 PM  Actions taken: Episode resolved, Sign clinical note

## 2017-06-20 NOTE — Telephone Encounter (Signed)
Please advise 

## 2017-06-20 NOTE — Telephone Encounter (Signed)
Pt. Wants to know what she can take for a dry cough. Does not want an office visit - just advise. No congestion, just a dry cough. Please advise.

## 2017-06-20 NOTE — Telephone Encounter (Signed)
Copied from [place] [id number]. Topic: General - Other >> Jun 20, 2017 11:40 AM Keene Breath wrote: Reason for CRM: Patient called to request to speak with a nurse or doctor regarding a cough that she has had for a few days that she cannot get rid of.  Did not want an appointment.  She said that she is schedule for eye surgery next week and wanted to see if she could get something over the counter to take.

## 2017-06-20 NOTE — Progress Notes (Signed)
Discharge Progress Report  Patient Details  Name: [name] MRN: [id number] Date of Birth: 09-Feb-1960 Referring Provider:     Pulmonary Rehab Walk Test from 03/18/2017 in [place]  Referring Provider  Dr. Elsworth Soho       Number of Visits: 5  Reason for Discharge:  Early Exit:  change in medical status  Smoking History:  Social History   Tobacco Use  Smoking Status Former Smoker  . Packs/day: 0.50  . Years: 10.00  . Pack years: 5.00  . Types: Cigarettes  Smokeless Tobacco Never Used  Tobacco Comment   quit smoking in 2004    Diagnosis:  Sarcoidosis  ADL UCSD: Pulmonary Assessment Scores    Row Name 03/18/17 1633 04/02/17 1220       ADL UCSD   ADL Phase  Entry  Entry    SOB Score total  -  78      CAT Score   CAT Score  -  32 Entry      mMRC Score   mMRC Score  2  -       Initial Exercise Prescription: Initial Exercise Prescription - 03/18/17 1600      Date of Initial Exercise RX and Referring Provider   Date  03/18/17    Referring Provider  Dr. Elsworth Soho      Oxygen   Oxygen  Continuous    Liters  -- 4-6      NuStep   Level  2    SPM  80    Minutes  17    METs  1.5      Track   Laps  5    Minutes  17      Prescription Details   Frequency (times per week)  2    Duration  Progress to 45 minutes of aerobic exercise without signs/symptoms of physical distress      Intensity   THRR 40-80% of Max Heartrate  66-132    Ratings of Perceived Exertion  11-13    Perceived Dyspnea  0-4      Progression   Progression  Continue to progress workloads to maintain intensity without signs/symptoms of physical distress.      Resistance Training   Training Prescription  Yes    Weight  orange bands    Reps  10-15       Discharge Exercise Prescription (Final Exercise Prescription Changes): Exercise Prescription Changes - 04/29/17 1600      Response to Exercise   Blood Pressure (Admit)  124/72    Blood Pressure  (Exercise)  130/72    Blood Pressure (Exit)  120/70    Heart Rate (Admit)  73 bpm    Heart Rate (Exercise)  92 bpm    Heart Rate (Exit)  76 bpm    Oxygen Saturation (Admit)  95 %    Oxygen Saturation (Exercise)  91 %    Oxygen Saturation (Exit)  97 %    Rating of Perceived Exertion (Exercise)  15    Perceived Dyspnea (Exercise)  1    Duration  Progress to 45 minutes of aerobic exercise without signs/symptoms of physical distress    Intensity  THRR unchanged      Progression   Progression  Continue to progress workloads to maintain intensity without signs/symptoms of physical distress.      Resistance Training   Training Prescription  Yes    Weight  orange bands    Reps  10-15  Time  10 Minutes      Oxygen   Oxygen  Continuous    Liters  2      Bike   Level  2    Minutes  17      NuStep   Level  5    SPM  80    Minutes  34    METs  2       Functional Capacity: 6 Minute Walk    Row Name 03/18/17 1626         6 Minute Walk   Phase  Initial     Distance  770 feet     Walk Time  6 minutes     # of Rest Breaks  0     MPH  1.45     METS  2.15     RPE  13     Perceived Dyspnea   1     Symptoms  Yes (comment)     Comments  wheelchair--9/10 back pain     Resting HR  75 bpm     Resting BP  130/82     Resting Oxygen Saturation   95 %     Exercise Oxygen Saturation  during 6 min walk  85 %     Max Ex. HR  106 bpm     Max Ex. BP  140/90       Interval HR   1 Minute HR  92     2 Minute HR  99     3 Minute HR  99     4 Minute HR  102     5 Minute HR  100     6 Minute HR  106     2 Minute Post HR  91     Interval Heart Rate?  Yes       Interval Oxygen   Interval Oxygen?  Yes     Baseline Oxygen Saturation %  95 %     1 Minute Oxygen Saturation %  94 %     1 Minute Liters of Oxygen  2 L     2 Minute Oxygen Saturation %  88 %     2 Minute Liters of Oxygen  2 L     3 Minute Oxygen Saturation %  88 %     3 Minute Liters of Oxygen  2 L     4 Minute Oxygen  Saturation %  85 %     4 Minute Liters of Oxygen  2 L     5 Minute Oxygen Saturation %  85 %     5 Minute Liters of Oxygen  4 L     6 Minute Oxygen Saturation %  86 %     6 Minute Liters of Oxygen  4 L     2 Minute Post Oxygen Saturation %  95 %     2 Minute Post Liters of Oxygen  4 L        Psychological, QOL, Others - Outcomes: PHQ 2/9: Depression screen [facility] 2/9 02/17/2017 01/15/2017 12/19/2016 11/06/2016 10/24/2016  Decreased Interest 1 1 3 1  0  Down, Depressed, Hopeless 1 0 2 1 0  PHQ - 2 Score 2 1 5 2  0  Altered sleeping 1 - 0 2 -  Tired, decreased energy 3 - 3 2 -  Change in appetite 1 - 1 1 -  Feeling bad or failure about yourself  0 - 0 0 -  Trouble concentrating 0 - 0 0 -  Moving slowly or fidgety/restless 0 - 0 0 -  Suicidal thoughts 0 - 0 0 -  PHQ-9 Score 7 - 9 7 -  Difficult doing work/chores - - - Not difficult at all -  Some recent data might be hidden    Quality of Life:   Personal Goals: Goals established at orientation with interventions provided to work toward goal. Personal Goals and Risk Factors at Admission - 02/17/17 1511      Core Components/Risk Factors/Patient Goals on Admission    Weight Management  Obesity;Yes    Intervention  Obesity: Provide education and appropriate resources to help participant work on and attain dietary goals.;Weight Management/Obesity: Establish reasonable short term and long term weight goals.    Expected Outcomes  Short Term: Continue to assess and modify interventions until short term weight is achieved;Understanding of distribution of calorie intake throughout the day with the consumption of 4-5 meals/snacks;Understanding recommendations for meals to include 15-35% energy as protein, 25-35% energy from fat, 35-60% energy from carbohydrates, less than 200mg  of dietary cholesterol, 20-35 gm of total fiber daily;Weight Loss: Understanding of general recommendations for a balanced deficit meal plan, which promotes 1-2 lb weight loss  per week and includes a negative energy balance of [phone] kcal/d    Improve shortness of breath with ADL's  Yes    Intervention  Provide education, individualized exercise plan and daily activity instruction to help decrease symptoms of SOB with activities of daily living.    Expected Outcomes  Short Term: Improve cardiorespiratory fitness to achieve a reduction of symptoms when performing ADLs;Long Term: Be able to perform more ADLs without symptoms or delay the onset of symptoms        Personal Goals Discharge: Goals and Risk Factor Review    Row Name 04/22/17 0717             Core Components/Risk Factors/Patient Goals Review   Personal Goals Review  Weight Management/Obesity;Improve shortness of breath with ADL's;Increase knowledge of respiratory medications and ability to use respiratory devices properly.;Develop more efficient breathing techniques such as purse lipped breathing and diaphragmatic breathing and practicing self-pacing with activity.       Review  patient is making slow progress towards pulmonary rehab goals. she missed last weeks sessions related to pink eye. she is tolerating workload increases however she has not began to loose weight. she has attended a pharmacy taught class on pulmonary medications and is beginning to use PLB technique during exertion.       Expected Outcomes  see admission expected outcomes          Exercise Goals and Review: Exercise Goals    Row Name 02/17/17 1454             Exercise Goals   Increase Physical Activity  Yes       Intervention  Provide advice, education, support and counseling about physical activity/exercise needs.       Expected Outcomes  Short Term: Attend rehab on a regular basis to increase amount of physical activity.       Increase Strength and Stamina  Yes       Intervention  Provide advice, education, support and counseling about physical activity/exercise needs.;Develop an individualized exercise prescription  for aerobic and resistive training based on initial evaluation findings, risk stratification, comorbidities and participant's personal goals.       Expected Outcomes  Short Term: Increase workloads from initial exercise prescription for resistance,  speed, and METs.       Able to understand and use rate of perceived exertion (RPE) scale  Yes       Intervention  Provide education and explanation on how to use RPE scale       Expected Outcomes  Short Term: Able to use RPE daily in rehab to express subjective intensity level;Long Term:  Able to use RPE to guide intensity level when exercising independently       Able to understand and use Dyspnea scale  Yes       Intervention  Provide education and explanation on how to use Dyspnea scale       Expected Outcomes  Short Term: Able to use Dyspnea scale daily in rehab to express subjective sense of shortness of breath during exertion;Long Term: Able to use Dyspnea scale to guide intensity level when exercising independently       Knowledge and understanding of Target Heart Rate Range (THRR)  Yes       Intervention  Provide education and explanation of THRR including how the numbers were predicted and where they are located for reference       Expected Outcomes  Short Term: Able to state/look up THRR;Short Term: Able to use daily as guideline for intensity in rehab;Long Term: Able to use THRR to govern intensity when exercising independently       Understanding of Exercise Prescription  Yes       Intervention  Provide education, explanation, and written materials on patient's individual exercise prescription       Expected Outcomes  Short Term: Able to explain program exercise prescription;Long Term: Able to explain home exercise prescription to exercise independently          Nutrition & Weight - Outcomes: Pre Biometrics - 02/17/17 1522      Pre Biometrics   Grip Strength  21 kg        Nutrition: Nutrition Therapy & Goals - 04/11/17 1227       Nutrition Therapy   Diet  Carb Modified, Heart Healthy      Personal Nutrition Goals   Nutrition Goal  Identify food quantities necessary to achieve wt loss of  -2# per week to a goal wt loss of 6-24 lb at graduation from pulmonary rehab.      Intervention Plan   Intervention  Prescribe, educate and counsel regarding individualized specific dietary modifications aiming towards targeted core components such as weight, hypertension, lipid management, diabetes, heart failure and other comorbidities.    Expected Outcomes  Short Term Goal: Understand basic principles of dietary content, such as calories, fat, sodium, cholesterol and nutrients.;Long Term Goal: Adherence to prescribed nutrition plan.       Nutrition Discharge: Nutrition Assessments - 04/11/17 1223      Rate Your Plate Scores   Pre Score  48       Education Questionnaire Score: Knowledge Questionnaire Score - 04/02/17 1220      Knowledge Questionnaire Score   Pre Score  13/18       Goals reviewed with patient; copy given to patient.

## 2017-06-22 NOTE — Telephone Encounter (Signed)
She can take Delsym cough syrup.  Ricola cough drops Thx

## 2017-06-23 DIAGNOSIS — E1122 Type 2 diabetes mellitus with diabetic chronic kidney disease: Secondary | ICD-10-CM | POA: Diagnosis not present

## 2017-06-23 DIAGNOSIS — D869 Sarcoidosis, unspecified: Secondary | ICD-10-CM | POA: Diagnosis not present

## 2017-06-23 DIAGNOSIS — I5030 Unspecified diastolic (congestive) heart failure: Secondary | ICD-10-CM | POA: Diagnosis not present

## 2017-06-23 DIAGNOSIS — L03213 Periorbital cellulitis: Secondary | ICD-10-CM | POA: Diagnosis not present

## 2017-06-23 DIAGNOSIS — I130 Hypertensive heart and chronic kidney disease with heart failure and stage 1 through stage 4 chronic kidney disease, or unspecified chronic kidney disease: Secondary | ICD-10-CM | POA: Diagnosis not present

## 2017-06-23 DIAGNOSIS — H04301 Unspecified dacryocystitis of right lacrimal passage: Secondary | ICD-10-CM | POA: Diagnosis not present

## 2017-06-25 ENCOUNTER — Encounter (HOSPITAL_COMMUNITY): Payer: Self-pay

## 2017-06-25 DIAGNOSIS — D869 Sarcoidosis, unspecified: Secondary | ICD-10-CM | POA: Diagnosis not present

## 2017-06-25 DIAGNOSIS — L03213 Periorbital cellulitis: Secondary | ICD-10-CM | POA: Diagnosis not present

## 2017-06-25 DIAGNOSIS — I5030 Unspecified diastolic (congestive) heart failure: Secondary | ICD-10-CM | POA: Diagnosis not present

## 2017-06-25 DIAGNOSIS — I130 Hypertensive heart and chronic kidney disease with heart failure and stage 1 through stage 4 chronic kidney disease, or unspecified chronic kidney disease: Secondary | ICD-10-CM | POA: Diagnosis not present

## 2017-06-25 DIAGNOSIS — E1122 Type 2 diabetes mellitus with diabetic chronic kidney disease: Secondary | ICD-10-CM | POA: Diagnosis not present

## 2017-06-25 DIAGNOSIS — H04301 Unspecified dacryocystitis of right lacrimal passage: Secondary | ICD-10-CM | POA: Diagnosis not present

## 2017-06-25 NOTE — Progress Notes (Signed)
PCP: Lew Dawes, MD  Cardiologist: Jenkins Rouge, MD  Pulmonologist: Kara Mead, MD  EKG: 12/18/16 in EPIC  Stress test: 02/18/13 in EPIC  ECHO: 11/21/16 in EPIC  Cardiac Cath: denies  Chest x-ray:05/15/17 in Epic  Pt is on Xarelto-began holding 06/21/17 per MD instruction

## 2017-06-25 NOTE — Progress Notes (Signed)
Anesthesia Chart Review:  Pt is a same day work up     Case:  [id number] Date/Time:  06/26/17 0915   Procedures:      LACRIMAL DUCT EXPLORATION AND ETHMOIDECTOMY (Right )     TEAR DUCT PROBING WITH STENT (Right )   Anesthesia type:  General   Pre-op diagnosis:  DACRYOCYSTIC OF LACRIMAL [place]   Location:  [facility] OR ROOM 08 / [place] OR   Surgeon:  Clista Bernhardt, MD      DISCUSSION:  - Pt is a 57 year old female with hx sarcoidosis (uses home 2L O2), recurrent DVT/PE, DM  - Pt to hold xarelto starting 06/21/17   - Pt hospitalized 5/10-5/13/19 for acute right-sided dacryocystitis with reactive preseptal cellulitis and retropharyngeal lymphadenopathy. Tx with broad spectrum antibiotics    PROVIDERS: PCP is Plotnikov, Evie Lacks, MD who is aware of upcoming surgery. Last office visit 06/02/17  Patient Care Team: Michael Boston, MD as Consulting Physician (General Surgery) Renato Shin, MD as Attending Physician (Internal Medicine) Bo Merino, MD as Consulting Physician (Rheumatology) Bo Merino, MD as Consulting Physician (Rheumatology) Rolm Bookbinder, MD as Consulting Physician (Dermatology) Rigoberto Noel, MD as Consulting Physician (Pulmonary Disease) Josue Hector, MD as Consulting Physician (Cardiology) Alanda Slim Neena Rhymes, MD as Consulting Physician (Ophthalmology)    LABS: Will be obtained day of surgery   IMAGES:  CXR 06/03/17: Chronic changes attributable to the underlying disease process. No acute abnormality noted.   EKG 12/08/16: NSR   CV:  Echo 11/21/16:  - Left ventricle: The cavity size was normal. Wall thickness was increased in a pattern of mild LVH. Systolic function was normal. The estimated ejection fraction was in the range of 60% to 65%. Although no diagnostic regional wall motion abnormality was identified, this possibility cannot be completely excluded on the basis of this study. Doppler parameters are consistent with abnormal left ventricular  relaxation (grade 1 diastolic dysfunction). - Aortic valve: There was no stenosis. - Mitral valve: There was no significant regurgitation. - Right ventricle: The cavity size was normal. Systolic function was normal. - Pulmonary arteries: No complete TR doppler jet so unable to estimate PA systolic pressure. - Systemic veins: IVC not visualized. - Impressions: Normal LV size with mild LV hypertrophy. EF 60-65%. Normal RV size and systolic function. No significant valvular abnormalities.  Nuclear stress test 02/09/13:  - Low risk stress nuclear study with a small, mild, partially reversible apical defect consistent with ischemia; findings could also be due to shifting breast attenuation.. - LV Ejection Fraction: 68%.  LV Wall Motion:  NL LV Function; NL Wall Motion   Past Medical History:  Diagnosis Date  . Allergic rhinitis   . ALLERGIC RHINITIS 10/26/2009  . Anxiety   . ANXIETY 08/14/2006  . B12 DEFICIENCY 08/25/2007  . Complication of anesthesia    pt has had difficulty following anesthesia with her knee in 2016-unable to care for herself afterward  . Confusion   . Depression    takes Lexapro daily  . Diabetes mellitus without complication ([place])    was on insulin but has been off since Nov 2015 and now only takes Metformin daily  . DYSPNEA 04/28/2009   with exertion  . Esophageal reflux    takes Nexium daily  . Fibromyalgia   . Headache    last migraine 2-59yrs ago;takes Topamax daily  . History of shingles   . Hypertension    takes Coreg daily  . Insomnia    takes Nortriptyline  nightly   . Joint pain   . Joint swelling   . Left knee pain   . Lichen planus   . Long-term memory loss   . Nocturia   . OSA (obstructive sleep apnea)    doesn't use CPAP;sleep study in epic from 2006  . Osteoarthritis   . Osteoarthritis   . Pneumonia    over 30 yrs ago  . Protein calorie malnutrition ([place])   . Rheumatoid arthritis ([place])   . Sarcoidosis    Dr. Lolita Patella  . Short-term memory  loss   . Shortness of breath   . TIA (transient ischemic attack)   . Unsteady gait   . Urinary urgency   . Vitamin D deficiency    is supposed to take Vit D but can't afford it  . VITAMIN D DEFICIENCY 08/25/2007    Past Surgical History:  Procedure Laterality Date  . APPENDECTOMY    . arthroscopic knee surgery Right 11-12-04  . AXILLARY ABCESS IRRIGATION AND DEBRIDEMENT  Jul & Aug2012  . CARPAL TUNNEL RELEASE Left 05/23/2014   Procedure: CARPAL TUNNEL RELEASE;  Surgeon: Meredith Pel, MD;  Location: [facility];  Service: Orthopedics;  Laterality: Left;  . cyst removed from top of buttocks  at age 82  . ENDOMETRIAL ABLATION    . TOTAL KNEE ARTHROPLASTY Right 11/15/2014   Procedure: TOTAL RIGHT KNEE ARTHROPLASTY;  Surgeon: Meredith Pel, MD;  Location: [place];  Service: Orthopedics;  Laterality: Right;  . TOTAL KNEE ARTHROPLASTY Left 07/13/2015   Procedure: LEFT TOTAL KNEE ARTHROPLASTY;  Surgeon: Meredith Pel, MD;  Location: [facility] [place];  Service: Orthopedics;  Laterality: Left;    MEDICATIONS: No current facility-administered medications for this encounter.    . Adalimumab (HUMIRA) 40 MG/0.8ML PSKT  . augmented betamethasone dipropionate (DIPROLENE-AF) 0.05 % ointment  . budesonide-formoterol (SYMBICORT) 160-4.5 MCG/ACT inhaler  . carvedilol (COREG) 12.5 MG tablet  . chlorhexidine (PERIDEX) 0.12 % solution  . clobetasol ointment (TEMOVATE) 0.05 %  . clonazePAM (KLONOPIN) 0.5 MG tablet  . esomeprazole (NEXIUM) 40 MG capsule  . furosemide (LASIX) 40 MG tablet  . gabapentin (NEURONTIN) 300 MG capsule  . metFORMIN (GLUCOPHAGE-XR) 500 MG 24 hr tablet  . potassium chloride (KLOR-CON 10) 10 MEQ tablet  . sitaGLIPtin (JANUVIA) 50 MG tablet  . sulfamethoxazole-trimethoprim (BACTRIM DS,SEPTRA DS) 800-160 MG tablet  . topiramate (TOPAMAX) 50 MG tablet  . venlafaxine XR (EFFEXOR-XR) 75 MG 24 hr capsule  . VOLTAREN 1 % GEL  . XARELTO 20 MG TABS tablet  . Lancets (FREESTYLE) lancets  .  neomycin-polymyxin b-dexamethasone (MAXITROL) 3.5-10000-0.1 SUSP  . VENTOLIN HFA 108 (90 Base) MCG/ACT inhaler    If labs acceptable day of surgery, I anticipate pt can proceed with surgery as scheduled.  Willeen Cass, FNP-BC [facility] Short Stay Surgical [facility]/Anesthesiology Phone: [phone] 06/25/2017 1:10 PM

## 2017-06-25 NOTE — Pre-Procedure Instructions (Signed)
[name]  06/25/2017      [place], [place], [place] [address] [address] Phone: [phone] Fax: [phone]    Your procedure is scheduled on June 26, 2017.  Report to [facility] Admitting at 700 AM.  Call this number if you have problems the morning of surgery:  [phone]   Remember:  Do not eat or drink after midnight.           Take these medicines the morning of surgery with A SIP OF WATER  *Carvedilol (coreg) Symbicort inhaler Ventolin inhaler Esomeprazole (nexium) Bactrim topamax effexor-XR  Hold/resume Xarelto per your surgeon's instructions.  7 days prior to surgery STOP taking any Aspirin(unless otherwise instructed by your surgeon), Aleve, Naproxen, Ibuprofen, Motrin, Advil, Goody's, BC's, all herbal medications, fish oil, and all vitamins  WHAT DO I DO ABOUT MY DIABETES MEDICATION?  [name] Do not take oral diabetes medicines (pills) the morning of surgery Januvia, metformin.  Reviewed and Endorsed by [facility] Patient Education Committee, August 2015   How to Manage Your Diabetes Before and After Surgery  Why is it important to control my blood sugar before and after surgery? . Improving blood sugar levels before and after surgery helps healing and can limit problems. . A way of improving blood sugar control is eating a healthy diet by: o  Eating less sugar and carbohydrates o  Increasing activity/exercise o  Talking with your doctor about reaching your blood sugar goals . High blood sugars (greater than 180 mg/dL) can raise your risk of infections and slow your recovery, so you will need to focus on controlling your diabetes during the weeks before surgery. . Make sure that the doctor who takes care of your diabetes knows about your planned surgery including the date and location.  How do I manage my blood sugar before surgery? . Check your blood  sugar at least 4 times a day, starting 2 days before surgery, to make sure that the level is not too high or low. o Check your blood sugar the morning of your surgery when you wake up and every 2 hours until you get to the Short Stay unit. . If your blood sugar is less than 70 mg/dL, you will need to treat for low blood sugar: o Do not take insulin. o Treat a low blood sugar (less than 70 mg/dL) with  cup of clear juice (cranberry or apple), 4 glucose tablets, OR glucose gel. Recheck blood sugar in 15 minutes after treatment (to make sure it is greater than 70 mg/dL). If your blood sugar is not greater than 70 mg/dL on recheck, call [phone] o  for further instructions. . Report your blood sugar to the short stay nurse when you get to Short Stay.  . If you are admitted to the hospital after surgery: o Your blood sugar will be checked by the staff and you will probably be given insulin after surgery (instead of oral diabetes medicines) to make sure you have good blood sugar levels. o The goal for blood sugar control after surgery is 80-180 mg/dL.   Do not wear jewelry, make-up or nail polish.  Do not wear lotions, powders, or perfumes, or deodorant.  Do not shave 48 hours prior to surgery.   Do not bring valuables to the hospital.  [facility] is not responsible for any belongings or valuables.  Contacts, dentures or bridgework may  not be worn into surgery.  Leave your suitcase in the car.  After surgery it may be brought to your room.  For patients admitted to the hospital, discharge time will be determined by your treatment team.  Patients discharged the day of surgery will not be allowed to drive home.    Please read over the following fact sheets that you were given.

## 2017-06-26 ENCOUNTER — Ambulatory Visit (HOSPITAL_COMMUNITY): Payer: Medicare Other | Admitting: Emergency Medicine

## 2017-06-26 ENCOUNTER — Encounter (HOSPITAL_COMMUNITY)
Admission: RE | Disposition: A | Discharge status: Home / Self Care | Payer: Self-pay | Source: Ambulatory Visit | Attending: Oculoplastics Ophthalmology

## 2017-06-26 ENCOUNTER — Ambulatory Visit (HOSPITAL_COMMUNITY)
Admission: RE | Admit: 2017-06-26 | Discharge: 2017-06-26 | Disposition: A | Discharge status: Home / Self Care | Payer: Medicare Other | Source: Ambulatory Visit | Attending: Oculoplastics Ophthalmology | Admitting: Oculoplastics Ophthalmology

## 2017-06-26 ENCOUNTER — Encounter (HOSPITAL_COMMUNITY): Payer: Self-pay | Admitting: *Deleted

## 2017-06-26 DIAGNOSIS — Z87891 Personal history of nicotine dependence: Secondary | ICD-10-CM | POA: Diagnosis not present

## 2017-06-26 DIAGNOSIS — Z9981 Dependence on supplemental oxygen: Secondary | ICD-10-CM | POA: Diagnosis not present

## 2017-06-26 DIAGNOSIS — E119 Type 2 diabetes mellitus without complications: Secondary | ICD-10-CM | POA: Diagnosis not present

## 2017-06-26 DIAGNOSIS — Z6841 Body Mass Index (BMI) 40.0 and over, adult: Secondary | ICD-10-CM | POA: Insufficient documentation

## 2017-06-26 DIAGNOSIS — I10 Essential (primary) hypertension: Secondary | ICD-10-CM | POA: Insufficient documentation

## 2017-06-26 DIAGNOSIS — G4733 Obstructive sleep apnea (adult) (pediatric): Secondary | ICD-10-CM | POA: Diagnosis not present

## 2017-06-26 DIAGNOSIS — K219 Gastro-esophageal reflux disease without esophagitis: Secondary | ICD-10-CM | POA: Insufficient documentation

## 2017-06-26 DIAGNOSIS — J449 Chronic obstructive pulmonary disease, unspecified: Secondary | ICD-10-CM | POA: Insufficient documentation

## 2017-06-26 DIAGNOSIS — H04301 Unspecified dacryocystitis of right lacrimal passage: Secondary | ICD-10-CM | POA: Diagnosis not present

## 2017-06-26 DIAGNOSIS — H04411 Chronic dacryocystitis of right lacrimal passage: Secondary | ICD-10-CM | POA: Insufficient documentation

## 2017-06-26 DIAGNOSIS — G4730 Sleep apnea, unspecified: Secondary | ICD-10-CM | POA: Diagnosis not present

## 2017-06-26 DIAGNOSIS — M069 Rheumatoid arthritis, unspecified: Secondary | ICD-10-CM | POA: Insufficient documentation

## 2017-06-26 HISTORY — PX: LACRIMAL DUCT EXPLORATION: SHX6569

## 2017-06-26 HISTORY — PX: TEAR DUCT PROBING: SHX793

## 2017-06-26 HISTORY — DX: Other complications of anesthesia, initial encounter: T88.59XA

## 2017-06-26 HISTORY — DX: Adverse effect of unspecified anesthetic, initial encounter: T41.45XA

## 2017-06-26 LAB — PROTIME-INR
INR: 0.93
PROTHROMBIN TIME: 12.3 s (ref 11.4–15.2)

## 2017-06-26 LAB — GLUCOSE, CAPILLARY
GLUCOSE-CAPILLARY: 125 mg/dL — AB (ref 65–99)
GLUCOSE-CAPILLARY: 113 mg/dL — AB (ref 65–99)

## 2017-06-26 LAB — HEMOGLOBIN A1C
HEMOGLOBIN A1C: 5.8 % — AB (ref 4.8–5.6)
MEAN PLASMA GLUCOSE: 119.76 mg/dL

## 2017-06-26 LAB — BASIC METABOLIC PANEL
Anion gap: 10 (ref 5–15)
BUN: 20 mg/dL (ref 6–20)
CHLORIDE: 107 mmol/L (ref 101–111)
CO2: 22 mmol/L (ref 22–32)
Calcium: 9.3 mg/dL (ref 8.9–10.3)
Creatinine, Ser: 1.38 mg/dL — ABNORMAL HIGH (ref 0.44–1.00)
GFR calc non Af Amer: 42 mL/min — ABNORMAL LOW (ref >60)
GFR, EST AFRICAN AMERICAN: 49 mL/min — AB (ref >60)
Glucose, Bld: 131 mg/dL — ABNORMAL HIGH (ref 65–99)
POTASSIUM: 4 mmol/L (ref 3.5–5.1)
SODIUM: 139 mmol/L (ref 135–145)

## 2017-06-26 LAB — CBC
HEMATOCRIT: 38.6 % (ref 36.0–46.0)
HEMOGLOBIN: 11.6 g/dL — AB (ref 12.0–15.0)
MCH: 27 pg (ref 26.0–34.0)
MCHC: 30.1 g/dL (ref 30.0–36.0)
MCV: 90 fL (ref 78.0–100.0)
Platelets: 243 10*3/uL (ref 150–400)
RBC: 4.29 MIL/uL (ref 3.87–5.11)
RDW: 18.6 % — ABNORMAL HIGH (ref 11.5–15.5)
WBC: 6.4 10*3/uL (ref 4.0–10.5)

## 2017-06-26 SURGERY — EXPLORATION, LACRIMAL DUCT
Anesthesia: General | Site: Eye | Laterality: Right

## 2017-06-26 MED ORDER — MIDAZOLAM HCL 5 MG/5ML IJ SOLN
INTRAMUSCULAR | Status: DC | PRN
  Administered 2017-06-26: 1 mg via INTRAVENOUS
  Administered 2017-06-26 (×2): 0.5 mg via INTRAVENOUS

## 2017-06-26 MED ORDER — PHENYLEPHRINE HCL 10 MG/ML IJ SOLN
INTRAVENOUS | Status: DC | PRN
  Administered 2017-06-26: 50 ug/min via INTRAVENOUS

## 2017-06-26 MED ORDER — ACETAMINOPHEN 10 MG/ML IV SOLN: 1000.0000 mg | Freq: Once | INTRAVENOUS | Status: DC | PRN

## 2017-06-26 MED ORDER — SUGAMMADEX SODIUM 500 MG/5ML IV SOLN
INTRAVENOUS | Status: AC
  Filled 2017-06-26: qty 5

## 2017-06-26 MED ORDER — FENTANYL CITRATE (PF) 100 MCG/2ML IJ SOLN
25.0000 ug | INTRAMUSCULAR | Status: DC | PRN
  Administered 2017-06-26: 50 ug via INTRAVENOUS

## 2017-06-26 MED ORDER — HYDROCODONE-ACETAMINOPHEN 5-325 MG PO TABS: 2.0000 | ORAL_TABLET | Freq: Four times a day (QID) | ORAL | 0 refills | Status: AC | PRN

## 2017-06-26 MED ORDER — PHENYLEPHRINE 40 MCG/ML (10ML) SYRINGE FOR IV PUSH (FOR BLOOD PRESSURE SUPPORT)
PREFILLED_SYRINGE | INTRAVENOUS | Status: AC
  Filled 2017-06-26: qty 10

## 2017-06-26 MED ORDER — OXYCODONE HCL 5 MG PO TABS: 5.0000 mg | ORAL_TABLET | Freq: Once | ORAL | Status: DC | PRN

## 2017-06-26 MED ORDER — TOBRAMYCIN-DEXAMETHASONE 0.3-0.1 % OP OINT
TOPICAL_OINTMENT | OPHTHALMIC | Status: AC
  Filled 2017-06-26: qty 3.5

## 2017-06-26 MED ORDER — LACTATED RINGERS IV SOLN
Freq: Once | INTRAVENOUS | Status: AC
  Administered 2017-06-26: 08:00:00 via INTRAVENOUS

## 2017-06-26 MED ORDER — EPINEPHRINE HCL (NASAL) 0.1 % NA SOLN
NASAL | Status: AC
  Filled 2017-06-26: qty 30

## 2017-06-26 MED ORDER — BUPIVACAINE HCL (PF) 0.5 % IJ SOLN
INTRAMUSCULAR | Status: AC
  Filled 2017-06-26: qty 30

## 2017-06-26 MED ORDER — SUGAMMADEX SODIUM 500 MG/5ML IV SOLN
INTRAVENOUS | Status: DC | PRN
  Administered 2017-06-26: 500 mg via INTRAVENOUS

## 2017-06-26 MED ORDER — SUCCINYLCHOLINE CHLORIDE 20 MG/ML IJ SOLN
INTRAMUSCULAR | Status: DC | PRN
  Administered 2017-06-26: 120 mg via INTRAVENOUS

## 2017-06-26 MED ORDER — SUCCINYLCHOLINE CHLORIDE 200 MG/10ML IV SOSY
PREFILLED_SYRINGE | INTRAVENOUS | Status: AC
  Filled 2017-06-26: qty 10

## 2017-06-26 MED ORDER — DEXAMETHASONE SODIUM PHOSPHATE 10 MG/ML IJ SOLN
INTRAMUSCULAR | Status: DC | PRN
  Administered 2017-06-26: 5 mg via INTRAVENOUS

## 2017-06-26 MED ORDER — DEXAMETHASONE SODIUM PHOSPHATE 10 MG/ML IJ SOLN
INTRAMUSCULAR | Status: AC
  Filled 2017-06-26: qty 1

## 2017-06-26 MED ORDER — ONDANSETRON HCL 4 MG/2ML IJ SOLN
INTRAMUSCULAR | Status: AC
  Filled 2017-06-26: qty 2

## 2017-06-26 MED ORDER — EPHEDRINE SULFATE 50 MG/ML IJ SOLN
INTRAMUSCULAR | Status: AC
  Filled 2017-06-26: qty 1

## 2017-06-26 MED ORDER — OXYMETAZOLINE HCL 0.05 % NA SOLN
NASAL | Status: DC | PRN
  Administered 2017-06-26: 1

## 2017-06-26 MED ORDER — BSS IO SOLN
INTRAOCULAR | Status: AC
  Filled 2017-06-26: qty 15

## 2017-06-26 MED ORDER — PROMETHAZINE HCL 25 MG/ML IJ SOLN: 6.2500 mg | INTRAMUSCULAR | Status: DC | PRN

## 2017-06-26 MED ORDER — FENTANYL CITRATE (PF) 100 MCG/2ML IJ SOLN
INTRAMUSCULAR | Status: AC
  Filled 2017-06-26: qty 2

## 2017-06-26 MED ORDER — PREDNISONE 10 MG PO TABS: 20.0000 mg | ORAL_TABLET | Freq: Every day | ORAL | 0 refills | Status: DC

## 2017-06-26 MED ORDER — ONDANSETRON HCL 4 MG/2ML IJ SOLN
INTRAMUSCULAR | Status: DC | PRN
  Administered 2017-06-26: 4 mg via INTRAVENOUS

## 2017-06-26 MED ORDER — LIDOCAINE-EPINEPHRINE 1 %-1:100000 IJ SOLN
INTRAMUSCULAR | Status: AC
  Filled 2017-06-26: qty 1

## 2017-06-26 MED ORDER — LIDOCAINE HCL (CARDIAC) PF 100 MG/5ML IV SOSY
PREFILLED_SYRINGE | INTRAVENOUS | Status: DC | PRN
  Administered 2017-06-26: 60 mg via INTRAVENOUS

## 2017-06-26 MED ORDER — HEMOSTATIC AGENTS (NO CHARGE) OPTIME
TOPICAL | Status: DC | PRN
  Administered 2017-06-26: 1 via TOPICAL

## 2017-06-26 MED ORDER — MIDAZOLAM HCL 2 MG/2ML IJ SOLN
INTRAMUSCULAR | Status: AC
  Filled 2017-06-26: qty 2

## 2017-06-26 MED ORDER — LIDOCAINE HCL 1 % IJ SOLN
INTRAMUSCULAR | Status: DC | PRN
  Administered 2017-06-26: 10 mL

## 2017-06-26 MED ORDER — EPHEDRINE SULFATE 50 MG/ML IJ SOLN
INTRAMUSCULAR | Status: DC | PRN
  Administered 2017-06-26: 25 mg via INTRAVENOUS
  Administered 2017-06-26: 10 mg via INTRAVENOUS
  Administered 2017-06-26: 25 mg via INTRAVENOUS

## 2017-06-26 MED ORDER — PROPOFOL 10 MG/ML IV BOLUS
INTRAVENOUS | Status: AC
  Filled 2017-06-26: qty 20

## 2017-06-26 MED ORDER — ROCURONIUM BROMIDE 100 MG/10ML IV SOLN
INTRAVENOUS | Status: DC | PRN
  Administered 2017-06-26: 50 mg via INTRAVENOUS

## 2017-06-26 MED ORDER — FENTANYL CITRATE (PF) 100 MCG/2ML IJ SOLN
INTRAMUSCULAR | Status: DC | PRN
  Administered 2017-06-26 (×3): 50 ug via INTRAVENOUS
  Administered 2017-06-26: 25 ug via INTRAVENOUS
  Administered 2017-06-26: 50 ug via INTRAVENOUS
  Administered 2017-06-26: 25 ug via INTRAVENOUS

## 2017-06-26 MED ORDER — PREDNISONE 10 MG PO TABS: 20.0000 mg | ORAL_TABLET | Freq: Every day | ORAL | 0 refills | Status: AC

## 2017-06-26 MED ORDER — PROMETHAZINE HCL 6.25 MG/5ML PO SYRP: 25.0000 mg | ORAL_SOLUTION | Freq: Every day | ORAL | 1 refills | Status: DC

## 2017-06-26 MED ORDER — SODIUM CHLORIDE 0.9 % IJ SOLN
INTRAMUSCULAR | Status: AC
  Filled 2017-06-26: qty 20

## 2017-06-26 MED ORDER — GLYCOPYRROLATE 0.2 MG/ML IJ SOLN
INTRAMUSCULAR | Status: DC | PRN
  Administered 2017-06-26: 0.4 mg via INTRAVENOUS

## 2017-06-26 MED ORDER — OXYMETAZOLINE HCL 0.05 % NA SOLN
2.0000 | Freq: Two times a day (BID) | NASAL | Status: DC
  Administered 2017-06-26: 2 via NASAL
  Filled 2017-06-26: qty 15

## 2017-06-26 MED ORDER — LACTATED RINGERS IV SOLN
INTRAVENOUS | Status: DC | PRN
  Administered 2017-06-26 (×3): via INTRAVENOUS

## 2017-06-26 MED ORDER — FENTANYL CITRATE (PF) 250 MCG/5ML IJ SOLN
INTRAMUSCULAR | Status: AC
  Filled 2017-06-26: qty 5

## 2017-06-26 MED ORDER — OXYCODONE HCL 5 MG/5ML PO SOLN: 5.0000 mg | Freq: Once | ORAL | Status: DC | PRN

## 2017-06-26 MED ORDER — PHENYLEPHRINE HCL 10 MG/ML IJ SOLN
INTRAMUSCULAR | Status: DC | PRN
  Administered 2017-06-26 (×5): 80 ug via INTRAVENOUS

## 2017-06-26 MED ORDER — ROCURONIUM BROMIDE 10 MG/ML (PF) SYRINGE
PREFILLED_SYRINGE | INTRAVENOUS | Status: AC
  Filled 2017-06-26: qty 5

## 2017-06-26 MED ORDER — OXYMETAZOLINE HCL 0.05 % NA SOLN
NASAL | Status: AC
  Filled 2017-06-26: qty 15

## 2017-06-26 MED ORDER — PROPARACAINE HCL 0.5 % OP SOLN
1.0000 [drp] | OPHTHALMIC | Status: DC
  Filled 2017-06-26: qty 15

## 2017-06-26 MED ORDER — LIDOCAINE 2% (20 MG/ML) 5 ML SYRINGE
INTRAMUSCULAR | Status: AC
  Filled 2017-06-26: qty 5

## 2017-06-26 MED ORDER — PROPOFOL 10 MG/ML IV BOLUS
INTRAVENOUS | Status: DC | PRN
  Administered 2017-06-26: 120 mg via INTRAVENOUS

## 2017-06-26 MED ORDER — TETRACAINE HCL 0.5 % OP SOLN
OPHTHALMIC | Status: AC
  Filled 2017-06-26: qty 4

## 2017-06-26 SURGICAL SUPPLY — 42 items
APPLICATOR DR MATTHEWS STRL (MISCELLANEOUS) ×3 IMPLANT
BLADE EYE CATARACT 19 1.4 BEAV (BLADE) ×3 IMPLANT
BLADE SURG 15 STRL LF DISP TIS (BLADE) ×1 IMPLANT
BLADE SURG 15 STRL SS (BLADE) ×2
CLEANER TIP ELECTROSURG 2X2 (MISCELLANEOUS) ×3 IMPLANT
CLOSURE STERI-STRIP 1/2X4 (GAUZE/BANDAGES/DRESSINGS) ×1
CLOSURE WOUND 1/2 X4 (GAUZE/BANDAGES/DRESSINGS) ×1
CLSR STERI-STRIP ANTIMIC 1/2X4 (GAUZE/BANDAGES/DRESSINGS) ×2 IMPLANT
COAGULATOR SUCT 6 FR SWTCH (ELECTROSURGICAL) ×1
COAGULATOR SUCT SWTCH 10FR 6 (ELECTROSURGICAL) ×2 IMPLANT
CORD BI POLAR (MISCELLANEOUS) ×3 IMPLANT
CORD BIPOLAR FORCEPS 12FT (ELECTRODE) ×3 IMPLANT
CORDS BIPOLAR (ELECTRODE) IMPLANT
COVER SURGICAL LIGHT HANDLE (MISCELLANEOUS) ×3 IMPLANT
DRAPE ORTHO SPLIT 77X108 STRL (DRAPES) ×2
DRAPE ORTHO SPLIT 87X125 STRL (DRAPES) ×3 IMPLANT
DRAPE SURG ORHT 6 SPLT 77X108 (DRAPES) ×1 IMPLANT
FLOSEAL 5ML (HEMOSTASIS) ×3 IMPLANT
GAUZE SPONGE 4X4 12PLY STRL LF (GAUZE/BANDAGES/DRESSINGS) ×3 IMPLANT
GLOVE BIO SURGEON STRL SZ7.5 (GLOVE) ×6 IMPLANT
GOWN STRL REUS W/ TWL LRG LVL3 (GOWN DISPOSABLE) ×2 IMPLANT
GOWN STRL REUS W/TWL LRG LVL3 (GOWN DISPOSABLE) ×4
KIT BASIN OR (CUSTOM PROCEDURE TRAY) ×3 IMPLANT
NEEDLE HYPO 30X.5 LL (NEEDLE) IMPLANT
NS IRRIG 1000ML POUR BTL (IV SOLUTION) ×3 IMPLANT
PACK CATARACT CUSTOM (CUSTOM PROCEDURE TRAY) ×3 IMPLANT
PAD ARMBOARD 7.5X6 YLW CONV (MISCELLANEOUS) ×6 IMPLANT
PATTIES SURGICAL 1/4 X 3 (GAUZE/BANDAGES/DRESSINGS) ×3 IMPLANT
PENCIL BUTTON HOLSTER BLD 10FT (ELECTRODE) ×3 IMPLANT
SET INTBT LACRIMAL .016X.025 (MISCELLANEOUS) ×3 IMPLANT
STRIP CLOSURE SKIN 1/2X4 (GAUZE/BANDAGES/DRESSINGS) ×2 IMPLANT
SURGIFLO W/THROMBIN 8M KIT (HEMOSTASIS) IMPLANT
SUT ETHILON 6 0 9-3 1X18 BLK (SUTURE) ×3 IMPLANT
SUT ETHILON 6 0 P 1 (SUTURE) ×3 IMPLANT
SUT ETHILON 7 0 P 1 (SUTURE) IMPLANT
SUT PLAIN 5 0 P 3 18 (SUTURE) IMPLANT
SUT SILK 4 0 P 3 (SUTURE) ×3 IMPLANT
SUT SILK 6 0 BV 1XDISCX (SUTURE) IMPLANT
TOWEL OR 17X24 6PK STRL BLUE (TOWEL DISPOSABLE) ×6 IMPLANT
TUBE CONNECTING 12'X1/4 (SUCTIONS) ×1
TUBE CONNECTING 12X1/4 (SUCTIONS) ×2 IMPLANT
WATER STERILE IRR 1000ML POUR (IV SOLUTION) ×3 IMPLANT

## 2017-06-26 NOTE — H&P (Signed)
Subjective:    [name] is a 57 y.o. female who presents for evaluation of chronic nasolacrimal duct obstruction. The pain is described as constant. Onset was several weeks ago. Symptoms have been unchanged since.   Review of Systems Pertinent items are noted in HPI.    Objective:   There were no vitals taken for this visit.  General:  alert, cooperative and appears stated age Skin:  normal Eyes: conjunctivae/corneas clear. PERRL, EOM's intact. Fundi benign., sac distended and skin over it erythematous OD, dacryocystitis Mouth: MMM no lesions Lymph Nodes:  Cervical, supraclavicular, and axillary nodes normal. 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   1. Discussed the risk of surgery,  and the risks of general anesthetic including MI, CVA, sudden death or even reaction to anesthetic medications. The patient understands the risks, any and all questions were answered to the patient's satisfaction. 2. Follow up: 1 day.  Date of Surgery Update (To be completed by Attending Surgeon day of surgery.)

## 2017-06-26 NOTE — Transfer of Care (Signed)
Immediate Anesthesia Transfer of Care Note  Patient: Blima Jaimes Ciesla  Procedure(s) Performed: LACRIMAL DUCT EXPLORATION AND ETHMOIDECTOMY (Right Eye) TEAR DUCT PROBING WITH STENT (Right Eye)  Patient Location: PACU  Anesthesia Type:General  Level of Consciousness: awake, alert , oriented and sedated  Airway & Oxygen Therapy: Patient Spontanous Breathing and Patient connected to face mask oxygen  Post-op Assessment: Report given to RN, Post -op Vital signs reviewed and stable and Patient moving all extremities  Post vital signs: Reviewed and stable  Last Vitals:  Vitals Value Taken Time  BP 100/62 06/26/2017 11:49 AM  Temp 36.2 C 06/26/2017 11:49 AM  Pulse 79 06/26/2017 11:52 AM  Resp 18 06/26/2017 11:52 AM  SpO2 100 % 06/26/2017 11:52 AM  Vitals shown include unvalidated device data.  Last Pain:  Vitals:   06/26/17 1149  TempSrc:   PainSc: 0-No pain      Patients Stated Pain Goal: 3 (40/98/11 9147)  Complications: No apparent anesthesia complications

## 2017-06-26 NOTE — Brief Op Note (Signed)
06/26/2017  9:40 AM  PATIENT:  [name]  57 y.o. female  PRE-OPERATIVE DIAGNOSIS:  DACRYOCYSTIC OF LACRIMAL [name]  POST-OPERATIVE DIAGNOSIS:  * No post-op diagnosis entered *  PROCEDURE:  Procedure(s): LACRIMAL DUCT EXPLORATION AND ETHMOIDECTOMY (Right) TEAR DUCT PROBING WITH STENT (Right)  SURGEON:  Surgeon(s) and Role:    * Garrison, Angela Najjar, MD - Primary  PHYSICIAN ASSISTANT: none  ASSISTANTS: none   ANESTHESIA:   local and general  EBL:  Less than 18ml   BLOOD ADMINISTERED:none  DRAINS: Crawford Stent   LOCAL MEDICATIONS USED:  BUPIVICAINE  and LIDOCAINE   SPECIMEN:  No Specimen  DISPOSITION OF SPECIMEN:  PATHOLOGY  COUNTS:  YES  TOURNIQUET:  * No tourniquets in log *  DICTATION: .Note written in EPIC  PLAN OF CARE: Discharge to home after PACU  PATIENT DISPOSITION:  PACU - hemodynamically stable.   Delay start of Pharmacological VTE agent (>24hrs) due to surgical blood loss or risk of bleeding: yes for 24hrs

## 2017-06-26 NOTE — Anesthesia Preprocedure Evaluation (Addendum)
Anesthesia Evaluation  Patient identified by MRN, date of birth, ID band Patient awake    Reviewed: Allergy & Precautions, NPO status , Patient's Chart, lab work & pertinent test results  Airway Mallampati: II  TM Distance: >3 FB Neck ROM: Full    Dental no notable dental hx.    Pulmonary sleep apnea , COPD,  oxygen dependent, former smoker,    Pulmonary exam normal breath sounds clear to auscultation       Cardiovascular hypertension, Normal cardiovascular exam Rhythm:Regular Rate:Normal     Neuro/Psych negative neurological ROS  negative psych ROS   GI/Hepatic Neg liver ROS, GERD  Medicated,  Endo/Other  diabetesMorbid obesity  Renal/GU negative Renal ROS  negative genitourinary   Musculoskeletal negative musculoskeletal ROS (+) Arthritis , Rheumatoid disorders,    Abdominal   Peds negative pediatric ROS (+)  Hematology negative hematology ROS (+)   Anesthesia Other Findings   Reproductive/Obstetrics negative OB ROS                           Anesthesia Physical Anesthesia Plan  ASA: IV  Anesthesia Plan: General   Post-op Pain Management:    Induction: Intravenous  PONV Risk Score and Plan: 3 and Ondansetron, Treatment may vary due to age or medical condition and Midazolam  Airway Management Planned: Oral ETT  Additional Equipment:   Intra-op Plan:   Post-operative Plan: Extubation in OR  Informed Consent: I have reviewed the patients History and Physical, chart, labs and discussed the procedure including the risks, benefits and alternatives for the proposed anesthesia with the patient or authorized representative who has indicated his/her understanding and acceptance.   Dental advisory given  Plan Discussed with: CRNA and Surgeon  Anesthesia Plan Comments:        Anesthesia Quick Evaluation

## 2017-06-26 NOTE — Interval H&P Note (Signed)
History and Physical Interval Note:  06/26/2017 9:35 AM  [name]  has presented today for surgery, with the diagnosis of DACRYOCYSTIC OF LACRIMAL SAC  The various methods of treatment have been discussed with the patient and family. After consideration of risks, benefits and other options for treatment, the patient has consented to  Procedure(s): LACRIMAL DUCT EXPLORATION AND ETHMOIDECTOMY (Right) TEAR DUCT PROBING WITH STENT (Right) as a surgical intervention .  The patient's history has been reviewed, patient examined, no change in status, stable for surgery.  I have reviewed the patient's chart and labs.  Questions were answered to the patient's satisfaction.     Clista Bernhardt

## 2017-06-26 NOTE — Op Note (Signed)
Procedure(s): LACRIMAL DUCT EXPLORATION AND ETHMOIDECTOMY TEAR DUCT PROBING WITH STENT Procedure Note  [name] female 57 y.o. 06/26/2017  Procedure(s) and Anesthesia Type:    * LACRIMAL DUCT EXPLORATION AND ETHMOIDECTOMY - General    * TEAR DUCT PROBING WITH STENT - General  Surgeon(s) and Role:    * Tyaisha Cullom, Peyton Najjar, MD - Primary  Procedure Detail  LACRIMAL DUCT EXPLORATION AND ETHMOIDECTOMY, TEAR DUCT PROBING WITH STENT Indications: The patient was admitted to the hospital with a brief history of right-sided nasolacrimal obstruction.  An intitial probing revealed chronic dacryocystitis. The patient now presents for dacryocystorhinostomy after discussing therapeutic alternatives.        Surgeon: Clista Bernhardt   Assistants: none Anesthesia: General endotracheal anesthesia and Local anesthesia 1% buffered lidocaine, with epinephrine, .75 bupivicaine  ASA Class: 3 Procedure Detail Patient was brought to the room in supine position where the appropriate monitoring was put in place. Then the patient was prepped and draped in the usual standard fashion of plastic surgery. The right nasal cavity was packed with cottonoid's soaked in afrin.  Attention was turned to the area between the medial canthus and the nasal bridge which has been previously marked. A 15 blade was used to incise the area. Then blunt dissection was taken down to the level of the anterior lacrimal crest. Then the periosteal elevator was used to bluntly remove the periosteum and the lacrimal sac off of the lacrimal crest. The elevator was used to infracture below the lacrimal crest to create an opening into the nasal cavity. Then a kerrison rongeur was used to remove the bone approximately 40mm to create a window into the nasal cavity. The nasal mucosa was infiltrated with local block. Then an anterior ethmoidectomy took place. Antention was turned to the puncta where a 1 bowman probe was used to  ensure the appropriate position of the nasolacrimal intubation tubes. A beaver blade was used to create an opening into the nasal cavity through the nasal mucosa. Crawford tubes were then inserted through the upper and lower canalicular system and retrieved through the nasal cavity with a curved forcep. This was sutured to the lateral nasal mucosa with a 4-0 silk suture.  Hemostasis was maintained with monopolar and bipolar cautery.  Surgiflow was placed into the defect and then the incision was closed with 5-0 undyed vicryl and then the skin was closed in an interrupted fashion with 6-0 nylon. Tobradex ointment was placed over the wound and it was covered with a steri-strip. Then the right nose was covered with an eyepad.  The patient tolerated the procedure well and was transferred to the PACU In stable condition.   Findings: Dacryocystitis   Estimated Blood Loss:  less than 50 mL         Drains: Crawford Tube         Total IV Fluids: <2L  Blood Given: none          Specimens: none         Implants: none        Complications:  * No complications entered in OR log *         Disposition: PACU - hemodynamically stable.         Condition: stable

## 2017-06-26 NOTE — Anesthesia Procedure Notes (Signed)
Procedure Name: Intubation Date/Time: 06/26/2017 10:07 AM Performed by: Scheryl Darter, CRNA Pre-anesthesia Checklist: Patient identified, Emergency Drugs available, Suction available and Patient being monitored Patient Re-evaluated:Patient Re-evaluated prior to induction Oxygen Delivery Method: Circle System Utilized Preoxygenation: Pre-oxygenation with 100% oxygen Induction Type: IV induction Ventilation: Mask ventilation without difficulty Laryngoscope Size: Miller Grade View: Grade I Tube type: Oral Number of attempts: 1 Airway Equipment and Method: Stylet and Oral airway Placement Confirmation: ETT inserted through vocal cords under direct vision,  positive ETCO2 and breath sounds checked- equal and bilateral Secured at: 23 cm Tube secured with: Tape Dental Injury: Teeth and Oropharynx as per pre-operative assessment

## 2017-06-26 NOTE — Telephone Encounter (Signed)
LM notifying pt

## 2017-06-26 NOTE — Discharge Instructions (Addendum)
Please keep patch on for 24hrs. Do not get patch wet. Patch is OK to remove tomorrow morning.  °You will have a medicated steri-strip covering your sutures. Please keep this intact until your post-operative appointment. It is OK to shower with this strip on- avoid rubbing the area. °You will have a suture inside your nose.- Do not pick at it.  °Start Maxitrol Ophthalmic Drops 2 times a day for 2 weeks in the operated eye.  °

## 2017-06-26 NOTE — Anesthesia Postprocedure Evaluation (Signed)
Anesthesia Post Note  Patient: [name]  Procedure(s) Performed: LACRIMAL DUCT EXPLORATION AND ETHMOIDECTOMY (Right Eye) TEAR DUCT PROBING WITH STENT (Right Eye)     Patient location during evaluation: PACU Anesthesia Type: General Level of consciousness: awake and alert Pain management: pain level controlled Vital Signs Assessment: post-procedure vital signs reviewed and stable Respiratory status: spontaneous breathing, nonlabored ventilation, respiratory function stable and patient connected to nasal cannula oxygen Cardiovascular status: blood pressure returned to baseline and stable Postop Assessment: no apparent nausea or vomiting Anesthetic complications: no    Last Vitals:  Vitals:   06/26/17 1204 06/26/17 1219  BP: (!) 102/54 103/60  Pulse: 80 78  Resp: 12 17  Temp:    SpO2: (!) 79% 96%    Last Pain:  Vitals:   06/26/17 1220  TempSrc:   PainSc: Asleep                 Arali Somera S

## 2017-06-27 ENCOUNTER — Encounter (HOSPITAL_COMMUNITY): Payer: Self-pay | Admitting: Oculoplastics Ophthalmology

## 2017-06-27 DIAGNOSIS — D869 Sarcoidosis, unspecified: Secondary | ICD-10-CM | POA: Diagnosis not present

## 2017-06-27 DIAGNOSIS — I5030 Unspecified diastolic (congestive) heart failure: Secondary | ICD-10-CM | POA: Diagnosis not present

## 2017-06-27 DIAGNOSIS — E1122 Type 2 diabetes mellitus with diabetic chronic kidney disease: Secondary | ICD-10-CM | POA: Diagnosis not present

## 2017-06-27 DIAGNOSIS — H04301 Unspecified dacryocystitis of right lacrimal passage: Secondary | ICD-10-CM | POA: Diagnosis not present

## 2017-06-27 DIAGNOSIS — I130 Hypertensive heart and chronic kidney disease with heart failure and stage 1 through stage 4 chronic kidney disease, or unspecified chronic kidney disease: Secondary | ICD-10-CM | POA: Diagnosis not present

## 2017-06-27 DIAGNOSIS — L03213 Periorbital cellulitis: Secondary | ICD-10-CM | POA: Diagnosis not present

## 2017-06-27 NOTE — Addendum Note (Signed)
Encounter addended by: Ivonne Andrew, RD on: 06/27/2017 2:47 PM  Actions taken: Flowsheet data copied forward, Visit Navigator Flowsheet section accepted

## 2017-06-30 ENCOUNTER — Encounter: Payer: Self-pay | Admitting: Internal Medicine

## 2017-06-30 ENCOUNTER — Ambulatory Visit (INDEPENDENT_AMBULATORY_CARE_PROVIDER_SITE_OTHER): Payer: Medicare Other | Admitting: Internal Medicine

## 2017-06-30 VITALS — BP 130/88 | HR 66 | Ht 67.0 in | Wt 273.0 lb

## 2017-06-30 DIAGNOSIS — E1122 Type 2 diabetes mellitus with diabetic chronic kidney disease: Secondary | ICD-10-CM | POA: Diagnosis not present

## 2017-06-30 DIAGNOSIS — E118 Type 2 diabetes mellitus with unspecified complications: Secondary | ICD-10-CM

## 2017-06-30 LAB — LIPID PANEL
Cholesterol: 237 mg/dL — ABNORMAL HIGH (ref 0–200)
HDL: 70 mg/dL (ref >39.00)
NonHDL: 167.47
Total CHOL/HDL Ratio: 3
Triglycerides: 271 mg/dL — ABNORMAL HIGH (ref 0.0–149.0)
VLDL: 54.2 mg/dL — ABNORMAL HIGH (ref 0.0–40.0)

## 2017-06-30 MED ORDER — GLUCOSE BLOOD VI STRP
ORAL_STRIP | 0 refills | Status: DC
Start: 2017-06-30 — End: 2017-09-11

## 2017-06-30 MED ORDER — ACCU-CHEK MULTICLIX LANCETS MISC: 0 refills | Status: DC

## 2017-06-30 NOTE — Patient Instructions (Addendum)
Please continue: - Metformin ER 1000 mg in am - Januvia 50 mg before breakfast  Please call and schedule an eye appt with Dr. Prudencio Burly: [facility]:  Dr. Sherlyn Lick MD ?  Address: [address], [place],  [address]  [phone]  Please come back for a follow-up appointment in 3 months.  PATIENT INSTRUCTIONS FOR TYPE 2 DIABETES:  DIET AND EXERCISE Diet and exercise is an important part of diabetic treatment.  We recommended aerobic exercise in the form of brisk walking (working between 40-60% of maximal aerobic capacity, similar to brisk walking) for 150 minutes per week (such as 30 minutes five days per week) along with 3 times per week performing 'resistance' training (using various gauge rubber tubes with handles) 5-10 exercises involving the major muscle groups (upper body, lower body and core) performing 10-15 repetitions (or near fatigue) each exercise. Start at half the above goal but build slowly to reach the above goals. If limited by weight, joint pain, or disability, we recommend daily walking in a swimming pool with water up to waist to reduce pressure from joints while allow for adequate exercise.    BLOOD GLUCOSES Monitoring your blood glucoses is important for continued management of your diabetes. Please check your blood glucoses 2-4 times a day: fasting, before meals and at bedtime (you can rotate these measurements - e.g. one day check before the 3 meals, the next day check before 2 of the meals and before bedtime, etc.).   HYPOGLYCEMIA (low blood sugar) Hypoglycemia is usually a reaction to not eating, exercising, or taking too much insulin/ other diabetes drugs.  Symptoms include tremors, sweating, hunger, confusion, headache, etc. Treat IMMEDIATELY with 15 grams of Carbs: . 4 glucose tablets .  cup regular juice/soda . 2 tablespoons raisins . 4 teaspoons sugar . 1 tablespoon honey Recheck blood glucose in 15 mins and repeat above  if still symptomatic/blood glucose <100.  RECOMMENDATIONS TO REDUCE YOUR RISK OF DIABETIC COMPLICATIONS: * Take your prescribed MEDICATION(S) * Follow a DIABETIC diet: Complex carbs, fiber rich foods, (monounsaturated and polyunsaturated) fats * AVOID saturated/trans fats, high fat foods, >2,300 mg salt per day. * EXERCISE at least 5 times a week for 30 minutes or preferably daily.  * DO NOT SMOKE OR DRINK more than 1 drink a day. * Check your FEET every day. Do not wear tightfitting shoes. Contact us if you develop an ulcer * See your EYE doctor once a year or more if needed * Get a FLU shot once a year * Get a PNEUMONIA vaccine once before and once after age 24 years  GOALS:  * Your Hemoglobin A1c of <7%  * fasting sugars need to be <130 * after meals sugars need to be <180 (2h after you start eating) * Your Systolic BP should be 009 or lower  * Your Diastolic BP should be 80 or lower  * Your HDL (Good Cholesterol) should be 40 or higher  * Your LDL (Bad Cholesterol) should be 100 or lower. * Your Triglycerides should be 150 or lower  * Your Urine microalbumin (kidney function) should be <30 * Your Body Mass Index should be 25 or lower    Please consider the following ways to cut down carbs and fat and increase fiber and micronutrients in your diet: - substitute whole grain for white bread or pasta - substitute brown rice for white rice - substitute 90-calorie flat bread pieces for slices of bread when possible - substitute  sweet potatoes or yams for white potatoes - substitute humus for margarine - substitute tofu for cheese when possible - substitute almond or rice milk for regular milk (would not drink soy milk daily due to concern for soy estrogen influence on breast cancer risk) - substitute dark chocolate for other sweets when possible - substitute water - can add lemon or orange slices for taste - for diet sodas (artificial sweeteners will trick your body that you can eat  sweets without getting calories and will lead you to overeating and weight gain in the long run) - do not skip breakfast or other meals (this will slow down the metabolism and will result in more weight gain over time)  - can try smoothies made from fruit and almond/rice milk in am instead of regular breakfast - can also try old-fashioned (not instant) oatmeal made with almond/rice milk in am - order the dressing on the side when eating salad at a restaurant (pour less than half of the dressing on the salad) - eat as little meat as possible - can try juicing, but should not forget that juicing will get rid of the fiber, so would alternate with eating raw veg./fruits or drinking smoothies - use as little oil as possible, even when using olive oil - can dress a salad with a mix of balsamic vinegar and lemon juice, for e.g. - use agave nectar, stevia sugar, or regular sugar rather than artificial sweateners - steam or broil/roast veggies  - snack on veggies/fruit/nuts (unsalted, preferably) when possible, rather than processed foods - reduce or eliminate aspartame in diet (it is in diet sodas, chewing gum, etc) Read the labels!  Try to read Dr. Janene Harvey book: "Program for Reversing Diabetes" for other ideas for healthy eating.

## 2017-06-30 NOTE — Progress Notes (Addendum)
Patient ID: [name], female   DOB: May 12, 1960, 57 y.o.   MRN: [id number]   HPI: [name] is a 57 y.o.-year-old female, self-referred by her, for management of Type 2 (vs. steroid-induced) diabetes, dx in 2012, non-insulin-dependent, uncontrolled, with complications (CKD, h/o TIA). She previously saw Dr. Loanne Drilling. Last visit with him 03/2017.  She was previously on insulin 2013-2015.  She was on intermittent Prednisone for Sarcoidosis in the past. Now on Humira.  She had recent tear duct sx. On 06/26/2017 >> now on Prednisone 20 mg daily for 7 days (now 4th day).  Last hemoglobin A1c was: Lab Results  Component Value Date   HGBA1C 5.8 (H) 06/26/2017   HGBA1C 6.7 (H) 04/02/2017   HGBA1C 7.1 (H) 07/22/2016   HGBA1C 5.9 01/30/2016   HGBA1C 5.8 (H) 07/04/2015   HGBA1C 6.0 04/03/2015   HGBA1C 5.8 02/01/2015   HGBA1C 6.2 06/28/2014   HGBA1C 6.8 (H) 03/24/2014   HGBA1C 6.8 (H) 08/02/2013   HGBA1C 6.4 04/27/2013   HGBA1C 7.3 (H) 01/27/2013   HGBA1C 7.1 (H) 09/17/2012   HGBA1C 6.7 (H) 06/02/2012   HGBA1C 6.1 03/03/2012   HGBA1C 6.1 11/26/2011   HGBA1C 16.4 (H) 05/09/2011   HGBA1C 7.3 (H) 10/10/2010   HGBA1C 7.0 (H) 07/09/2010   HGBA1C 6.2 03/07/2010   Pt is on a regimen of: - Metformin ER 1000 mg in am - Januvia 50 mg in am  Pt checks her sugars 1x  a day (not in last 3 weeks as she lost her meter) - am: 120-125 - 2h after b'fast: n/c - before lunch: n/c - 2h after lunch: n/c - before dinner: n/c - 2h after dinner: n/c - bedtime: 135-140 - nighttime: n/c No lows. Lowest sugar was 105;? hypoglycemia awareness. Highest sugar was 150  Glucometer: none now >> given new meter today  Pt's meals are: - Breakfast: watermelon; cereal + 2% milk; coffee - Lunch: may skip; sandwich or soup - Dinner: cereal or sandwich or baked chicken + veggies (greens, corn) + rice or spaghetti - Snacks: popcorn  - + CKD, last BUN/creatinine:  Lab Results  Component  Value Date   BUN 20 06/26/2017   BUN 19 06/02/2017   CREATININE 1.38 (H) 06/26/2017   CREATININE 1.14 06/02/2017   - last set of lipids: Lab Results  Component Value Date   CHOL 193 06/29/2008   HDL 67.50 06/29/2008   LDLCALC 97 06/29/2008   TRIG 145.0 06/29/2008   CHOLHDL 3 06/29/2008   - last eye exam was in 2016. No DR.  - no numbness and tingling in her feet.   Pt has FH of DM in mother, sister.  She has lichen planus on legs.  ROS: Constitutional: + Fatigue, + weight loss, + feeling excessively cold, + nocturia Eyes: + Blurry vision, no xerophthalmia ENT: no sore throat, no nodules palpated in throat, no dysphagia/odynophagia, no hoarseness Cardiovascular: no CP/SOB/palpitations/leg swelling Respiratory: + Cough/no SOB Gastrointestinal: no N/V/D/C Musculoskeletal: no muscle/joint aches, + bone pain Skin: + rash - feet, scalp, + hair loss Neurological: no tremors/numbness/tingling/dizziness Psychiatric: no depression/anxiety  Past Medical History:  Diagnosis Date  . Allergic rhinitis   . ALLERGIC RHINITIS 10/26/2009  . Anxiety   . ANXIETY 08/14/2006  . B12 DEFICIENCY 08/25/2007  . Complication of anesthesia    pt has had difficulty following anesthesia with her knee in 2016-unable to care for herself afterward  . Confusion   . Depression    takes Lexapro daily  .  Diabetes mellitus without complication ([place])    was on insulin but has been off since Nov 2015 and now only takes Metformin daily  . DYSPNEA 04/28/2009   with exertion  . Esophageal reflux    takes Nexium daily  . Fibromyalgia   . Headache    last migraine 2-60yrs ago;takes Topamax daily  . History of shingles   . Hypertension    takes Coreg daily  . Insomnia    takes Nortriptyline nightly   . Joint pain   . Joint swelling   . Left knee pain   . Lichen planus   . Long-term memory loss   . Nocturia   . OSA (obstructive sleep apnea)    doesn't use CPAP;sleep study in epic from 2006  .  Osteoarthritis   . Osteoarthritis   . Pneumonia    over 30 yrs ago  . Protein calorie malnutrition ([place])   . Rheumatoid arthritis ([place])   . Sarcoidosis    Dr. Lolita Patella  . Short-term memory loss   . Shortness of breath   . TIA (transient ischemic attack)   . Unsteady gait   . Urinary urgency   . Vitamin D deficiency    is supposed to take Vit D but can't afford it  . VITAMIN D DEFICIENCY 08/25/2007   Past Surgical History:  Procedure Laterality Date  . APPENDECTOMY    . arthroscopic knee surgery Right 11-12-04  . AXILLARY ABCESS IRRIGATION AND DEBRIDEMENT  Jul & Aug2012  . CARPAL TUNNEL RELEASE Left 05/23/2014   Procedure: CARPAL TUNNEL RELEASE;  Surgeon: Meredith Pel, MD;  Location: [place];  Service: Orthopedics;  Laterality: Left;  . cyst removed from top of buttocks  at age 26  . ENDOMETRIAL ABLATION    . LACRIMAL DUCT EXPLORATION Right 06/26/2017   Procedure: LACRIMAL DUCT EXPLORATION AND ETHMOIDECTOMY;  Surgeon: Clista Bernhardt, MD;  Location: [facility] [place];  Service: Ophthalmology;  Laterality: Right;  . TEAR DUCT PROBING Right 06/26/2017   Procedure: TEAR DUCT PROBING WITH STENT;  Surgeon: Clista Bernhardt, MD;  Location: [place];  Service: Ophthalmology;  Laterality: Right;  . TOTAL KNEE ARTHROPLASTY Right 11/15/2014   Procedure: TOTAL RIGHT KNEE ARTHROPLASTY;  Surgeon: Meredith Pel, MD;  Location: [place];  Service: Orthopedics;  Laterality: Right;  . TOTAL KNEE ARTHROPLASTY Left 07/13/2015   Procedure: LEFT TOTAL KNEE ARTHROPLASTY;  Surgeon: Meredith Pel, MD;  Location: [place];  Service: Orthopedics;  Laterality: Left;   Social History   Socioeconomic History  . Marital status: Single    Spouse name: Not on file  . Number of children: Not on file  . Years of education: Not on file  . Highest education level: Not on file  Occupational History  . Occupation: disabled  Social Needs  . Financial resource strain: Not on file  . Food insecurity:    Worry: Not on  file    Inability: Not on file  . Transportation needs:    Medical: Not on file    Non-medical: Not on file  Tobacco Use  . Smoking status: Former Smoker    Packs/day: 0.50    Years: 10.00    Pack years: 5.00    Types: Cigarettes  . Smokeless tobacco: Never Used  . Tobacco comment: quit smoking in 2004  Substance and Sexual Activity  . Alcohol use: No  . Drug use: No  . Sexual activity: Not Currently  Lifestyle  . Physical activity:    Days  per week: Not on file    Minutes per session: Not on file  . Stress: Not on file  Relationships  . Social connections:    Talks on phone: Not on file    Gets together: Not on file    Attends religious service: Not on file    Active member of club or organization: Not on file    Attends meetings of clubs or organizations: Not on file    Relationship status: Not on file  . Intimate partner violence:    Fear of current or ex partner: Not on file    Emotionally abused: Not on file    Physically abused: Not on file    Forced sexual activity: Not on file  Other Topics Concern  . Not on file  Social History Narrative   Single, broke up with partner in 2008.   Current Outpatient Medications on File Prior to Visit  Medication Sig Dispense Refill  . Adalimumab (HUMIRA) 40 MG/0.8ML PSKT Inject 40 mg into the skin every 7 (seven) days.     [name] augmented betamethasone dipropionate (DIPROLENE-AF) 0.05 % ointment Apply 1 application topically 2 (two) times daily.  8  . budesonide-formoterol (SYMBICORT) 160-4.5 MCG/ACT inhaler Inhale 2 puffs into the lungs TWICE DAILY 120/4=30 10.2 g 5  . carvedilol (COREG) 12.5 MG tablet Take 1 tablet (12.5 mg total) by mouth 2 (two) times daily with a meal. 180 tablet 3  . chlorhexidine (PERIDEX) 0.12 % solution Use as directed 5 mLs in the mouth or throat 2 (two) times daily. Pt is to swish and spit. 120 mL 0  . clobetasol ointment (TEMOVATE) 5.95 % Apply 1 application topically See admin instructions. Apply to the  scalp two to three times a day (Lichen planus)    . clonazePAM (KLONOPIN) 0.5 MG tablet Take 1-2 tablets BY MOUTH at bedtime (Patient taking differently: Take 0.5 mg by mouth at bedtime) 60 tablet 2  . esomeprazole (NEXIUM) 40 MG capsule Take 1 capsule (40 mg total) by mouth daily before breakfast. 90 capsule 3  . furosemide (LASIX) 40 MG tablet TAKE 1 TABLET BY MOUTH EVERY MORNING FOR EDEMA 90 tablet 1  . gabapentin (NEURONTIN) 300 MG capsule Take 1 capsule (300 mg total) by mouth 2 (two) times daily as needed (for pain). 180 capsule 1  . Lancets (FREESTYLE) lancets 1 each.    . metFORMIN (GLUCOPHAGE-XR) 500 MG 24 hr tablet TAKE 2 TABLETS(1000 MG) BY MOUTH DAILY WITH BREAKFAST 180 tablet 1  . neomycin-polymyxin b-dexamethasone (MAXITROL) 3.5-10000-0.1 SUSP Place 1 drop into the right eye 2 (two) times daily.  1  . potassium chloride (KLOR-CON 10) 10 MEQ tablet Take 1 tablet (10 mEq total) by mouth daily. 90 tablet 3  . predniSONE (DELTASONE) 10 MG tablet Take 2 tablets (20 mg total) by mouth daily for 7 days. 14 tablet 0  . predniSONE (DELTASONE) 10 MG tablet Take 2 tablets (20 mg total) by mouth daily with breakfast for 7 days. 14 tablet 0  . sitaGLIPtin (JANUVIA) 50 MG tablet Take 1 tablet (50 mg total) by mouth daily. 90 tablet 0  . sulfamethoxazole-trimethoprim (BACTRIM DS,SEPTRA DS) 800-160 MG tablet Take 1 tablet by mouth 2 (two) times daily. 12 tablet 0  . topiramate (TOPAMAX) 50 MG tablet Take 1 tablet (50 mg total) by mouth 2 (two) times daily. 180 tablet 1  . venlafaxine XR (EFFEXOR-XR) 75 MG 24 hr capsule Take 1 capsule (75 mg total) by mouth daily with breakfast. 30 capsule  11  . VENTOLIN HFA 108 (90 Base) MCG/ACT inhaler Inhale 2 puffs into the lungs every 6 (six) hours as needed for wheezing or shortness of breath. 200/8=25 18 g 11  . VOLTAREN 1 % GEL Apply 2 g topically 2 (two) times daily as needed (for pain). 100/4=25 100 g 2  . XARELTO 20 MG TABS tablet Take 1 tablet (20 mg total)  by mouth at bedtime. 90 tablet 3  . promethazine (PHENERGAN) 6.25 MG/5ML syrup Take 20 mLs (25 mg total) by mouth daily for 3 days. 120 mL 1  . [DISCONTINUED] potassium chloride (K-DUR,KLOR-CON) 10 MEQ tablet TAKE 1 TABLET BY MOUTH DAILY 90 tablet 2   No current facility-administered medications on file prior to visit.    Allergies  Allergen Reactions  . Belsomra [Suvorexant] Other (See Comments)    [place] out of bed while asleep: "I'm waking up as I'm falling on the floor;" "Night terrors"  . Enalapril Maleate Cough  . Latex Hives  . Nickel Other (See Comments)    Blisters  Pt has a titanium right and left knee - nickel causes skin irritations that form into blisters and sores  . Other Other (See Comments)    Patient has Sarcoidosis and can't tolerate any metals  . Iodine Other (See Comments)    Mother was intolerant--CODED on CT table---pt never tried  . Doxycycline Diarrhea and Nausea And Vomiting  . Hydrochlorothiazide Other (See Comments)    Low potassium levels   . Hydroxychloroquine Sulfate Other (See Comments)    Vision changes   . Lyrica [Pregabalin] Other (See Comments)    "Made me feel high"  . Tape Other (See Comments)    Medical tape causes bruising   Family History  Problem Relation Age of Onset  . Heart disease Mother   . Kidney disease Mother   . Cancer Father        leukemia  . Hypertension Unknown   . Coronary artery disease Unknown        female 1st degree relative <60  . Heart failure Unknown        congestive  . Coronary artery disease Unknown        Female 1st degree relative <50  . Breast cancer Unknown        1st degree relative <50 S  . Breast cancer Sister     PE: BP 130/88   Pulse 66   Ht 5\' 7"  (1.702 m)   Wt 273 lb (123.8 kg)   SpO2 97%   BMI 42.76 kg/m  Wt Readings from Last 3 Encounters:  06/30/17 273 lb (123.8 kg)  06/26/17 272 lb (123.4 kg)  06/02/17 274 lb (124.3 kg)   Constitutional: Obese, in NAD, on O2 - 2 LMP mostly with  walking Eyes: PERRLA, EOMI, no exophthalmos ENT: moist mucous membranes, no thyromegaly, no cervical lymphadenopathy Cardiovascular: RRR, No MRG Respiratory: CTA B Gastrointestinal: abdomen soft, NT, ND, BS+ Musculoskeletal: no deformities, strength intact in all 4 Skin: moist, warm, + rashes scalp the left foot - sarcoidosis Neurological: no tremor with outstretched hands, DTR normal in all 4  ASSESSMENT: 1. DM2, non-insulin-dependent, uncontrolled, with complications - CKD - cerebrovascular ds - s/p TIA  2. HL  PLAN:  1. Patient with long-standing, uncontrolled diabetes, on oral antidiabetic regimen, with good control, based on the last HbA1c of 5.8% obtained 4 days ago.  We reviewed the trend of her HbA1c levels together with the patient.  I explained that as of  now, he will control is exemplary.  However, she has not been checking sugars in the last 3 weeks as she lost her meter.  We gave her another one and sent supplies to her pharmacy.  Before the last 3 weeks, the sugars were mostly at goal (see HPI).  For now, especially since she is on prednisone and also since she is starting to eat better (she had nausea/vomiting/diarrhea before her surgery), I would not suggest to change the regimen, but the next visit, I am hoping we can eliminate Januvia. - We discussed about improving her diet by including less meat and fatty foods and more fresh fruits and vegetables, grains, legumes - I also gave her information for Dr. Prudencio Burly, ophthalmologist - I suggested to:  Patient Instructions  Please continue: - Metformin ER 1000 mg in am - Januvia 50 mg before breakfast  Please call and schedule an eye appt with Dr. Prudencio Burly: [facility]:  Dr. Sherlyn Lick MD ?  Address: [address], [place], [place] [address]  [phone]  Please come back for a follow-up appointment in 3 months.  - Strongly advised her to start checking sugars at different times of the day -  check once a day, rotating checks - given sugar log and advised how to fill it and to bring it at next appt  - given foot care handout and explained the principles  - given instructions for hypoglycemia management "15-15 rule"  - advised for yearly eye exams  - We will check a lipid panel today (nonfasting) - Return to clinic in 3 mo with sugar log   - time spent with the patient: 40 min, of which >50% was spent in reviewing her diabetes history, reviewing her blood sugars at home, medication regimen, discussing about diet and suggesting changes, and also developing a plan to avoid hypo- and hyper-glycemia.   Component     Latest Ref Rng & Units 06/30/2017  Cholesterol     0 - 200 mg/dL 237 (H)  Triglycerides     0.0 - 149.0 mg/dL 271.0 (H)  HDL Cholesterol     >39.00 mg/dL 70.00  VLDL     0.0 - 40.0 mg/dL 54.2 (H)  Total CHOL/HDL Ratio      3  NonHDL      167.47  Direct LDL     mg/dL 107.0   LDL higher than target, tChol and TG also high. I will suggest Pravastatin 40.  Philemon Kingdom, MD PhD [facility]

## 2017-07-01 DIAGNOSIS — L03213 Periorbital cellulitis: Secondary | ICD-10-CM | POA: Diagnosis not present

## 2017-07-01 DIAGNOSIS — I5030 Unspecified diastolic (congestive) heart failure: Secondary | ICD-10-CM | POA: Diagnosis not present

## 2017-07-01 DIAGNOSIS — I130 Hypertensive heart and chronic kidney disease with heart failure and stage 1 through stage 4 chronic kidney disease, or unspecified chronic kidney disease: Secondary | ICD-10-CM | POA: Diagnosis not present

## 2017-07-01 DIAGNOSIS — E1122 Type 2 diabetes mellitus with diabetic chronic kidney disease: Secondary | ICD-10-CM | POA: Diagnosis not present

## 2017-07-01 DIAGNOSIS — H04301 Unspecified dacryocystitis of right lacrimal passage: Secondary | ICD-10-CM | POA: Diagnosis not present

## 2017-07-01 DIAGNOSIS — D869 Sarcoidosis, unspecified: Secondary | ICD-10-CM | POA: Diagnosis not present

## 2017-07-01 LAB — LDL CHOLESTEROL, DIRECT: LDL DIRECT: 107 mg/dL

## 2017-07-02 DIAGNOSIS — I130 Hypertensive heart and chronic kidney disease with heart failure and stage 1 through stage 4 chronic kidney disease, or unspecified chronic kidney disease: Secondary | ICD-10-CM | POA: Diagnosis not present

## 2017-07-02 DIAGNOSIS — L03213 Periorbital cellulitis: Secondary | ICD-10-CM | POA: Diagnosis not present

## 2017-07-02 DIAGNOSIS — H04301 Unspecified dacryocystitis of right lacrimal passage: Secondary | ICD-10-CM | POA: Diagnosis not present

## 2017-07-02 DIAGNOSIS — E1122 Type 2 diabetes mellitus with diabetic chronic kidney disease: Secondary | ICD-10-CM | POA: Diagnosis not present

## 2017-07-02 DIAGNOSIS — D869 Sarcoidosis, unspecified: Secondary | ICD-10-CM | POA: Diagnosis not present

## 2017-07-02 DIAGNOSIS — I5030 Unspecified diastolic (congestive) heart failure: Secondary | ICD-10-CM | POA: Diagnosis not present

## 2017-07-03 DIAGNOSIS — F339 Major depressive disorder, recurrent, unspecified: Secondary | ICD-10-CM | POA: Diagnosis not present

## 2017-07-03 DIAGNOSIS — J449 Chronic obstructive pulmonary disease, unspecified: Secondary | ICD-10-CM

## 2017-07-03 DIAGNOSIS — Z86718 Personal history of other venous thrombosis and embolism: Secondary | ICD-10-CM

## 2017-07-03 DIAGNOSIS — F419 Anxiety disorder, unspecified: Secondary | ICD-10-CM | POA: Diagnosis not present

## 2017-07-03 DIAGNOSIS — Z7901 Long term (current) use of anticoagulants: Secondary | ICD-10-CM

## 2017-07-03 DIAGNOSIS — G4733 Obstructive sleep apnea (adult) (pediatric): Secondary | ICD-10-CM | POA: Diagnosis not present

## 2017-07-03 DIAGNOSIS — J9611 Chronic respiratory failure with hypoxia: Secondary | ICD-10-CM

## 2017-07-03 DIAGNOSIS — E1122 Type 2 diabetes mellitus with diabetic chronic kidney disease: Secondary | ICD-10-CM | POA: Diagnosis not present

## 2017-07-03 DIAGNOSIS — H04301 Unspecified dacryocystitis of right lacrimal passage: Secondary | ICD-10-CM | POA: Diagnosis not present

## 2017-07-03 DIAGNOSIS — D519 Vitamin B12 deficiency anemia, unspecified: Secondary | ICD-10-CM | POA: Diagnosis not present

## 2017-07-03 DIAGNOSIS — N182 Chronic kidney disease, stage 2 (mild): Secondary | ICD-10-CM | POA: Diagnosis not present

## 2017-07-03 DIAGNOSIS — Z9181 History of falling: Secondary | ICD-10-CM

## 2017-07-03 DIAGNOSIS — I2510 Atherosclerotic heart disease of native coronary artery without angina pectoris: Secondary | ICD-10-CM | POA: Diagnosis not present

## 2017-07-03 DIAGNOSIS — Z87891 Personal history of nicotine dependence: Secondary | ICD-10-CM

## 2017-07-03 DIAGNOSIS — Z792 Long term (current) use of antibiotics: Secondary | ICD-10-CM

## 2017-07-03 DIAGNOSIS — K219 Gastro-esophageal reflux disease without esophagitis: Secondary | ICD-10-CM

## 2017-07-03 DIAGNOSIS — Z86711 Personal history of pulmonary embolism: Secondary | ICD-10-CM

## 2017-07-03 DIAGNOSIS — Z8673 Personal history of transient ischemic attack (TIA), and cerebral infarction without residual deficits: Secondary | ICD-10-CM

## 2017-07-03 DIAGNOSIS — M069 Rheumatoid arthritis, unspecified: Secondary | ICD-10-CM

## 2017-07-03 DIAGNOSIS — Z7984 Long term (current) use of oral hypoglycemic drugs: Secondary | ICD-10-CM

## 2017-07-03 DIAGNOSIS — I130 Hypertensive heart and chronic kidney disease with heart failure and stage 1 through stage 4 chronic kidney disease, or unspecified chronic kidney disease: Secondary | ICD-10-CM | POA: Diagnosis not present

## 2017-07-03 DIAGNOSIS — D869 Sarcoidosis, unspecified: Secondary | ICD-10-CM | POA: Diagnosis not present

## 2017-07-03 DIAGNOSIS — I5030 Unspecified diastolic (congestive) heart failure: Secondary | ICD-10-CM | POA: Diagnosis not present

## 2017-07-03 DIAGNOSIS — L03213 Periorbital cellulitis: Secondary | ICD-10-CM | POA: Diagnosis not present

## 2017-07-03 DIAGNOSIS — Z96653 Presence of artificial knee joint, bilateral: Secondary | ICD-10-CM

## 2017-07-04 DIAGNOSIS — L03213 Periorbital cellulitis: Secondary | ICD-10-CM | POA: Diagnosis not present

## 2017-07-04 DIAGNOSIS — E1122 Type 2 diabetes mellitus with diabetic chronic kidney disease: Secondary | ICD-10-CM | POA: Diagnosis not present

## 2017-07-04 DIAGNOSIS — I5030 Unspecified diastolic (congestive) heart failure: Secondary | ICD-10-CM | POA: Diagnosis not present

## 2017-07-04 DIAGNOSIS — D869 Sarcoidosis, unspecified: Secondary | ICD-10-CM | POA: Diagnosis not present

## 2017-07-04 DIAGNOSIS — H04301 Unspecified dacryocystitis of right lacrimal passage: Secondary | ICD-10-CM | POA: Diagnosis not present

## 2017-07-04 DIAGNOSIS — I130 Hypertensive heart and chronic kidney disease with heart failure and stage 1 through stage 4 chronic kidney disease, or unspecified chronic kidney disease: Secondary | ICD-10-CM | POA: Diagnosis not present

## 2017-07-07 DIAGNOSIS — H04301 Unspecified dacryocystitis of right lacrimal passage: Secondary | ICD-10-CM | POA: Diagnosis not present

## 2017-07-07 DIAGNOSIS — I5030 Unspecified diastolic (congestive) heart failure: Secondary | ICD-10-CM | POA: Diagnosis not present

## 2017-07-07 DIAGNOSIS — I130 Hypertensive heart and chronic kidney disease with heart failure and stage 1 through stage 4 chronic kidney disease, or unspecified chronic kidney disease: Secondary | ICD-10-CM | POA: Diagnosis not present

## 2017-07-07 DIAGNOSIS — D869 Sarcoidosis, unspecified: Secondary | ICD-10-CM | POA: Diagnosis not present

## 2017-07-07 DIAGNOSIS — L03213 Periorbital cellulitis: Secondary | ICD-10-CM | POA: Diagnosis not present

## 2017-07-07 DIAGNOSIS — E1122 Type 2 diabetes mellitus with diabetic chronic kidney disease: Secondary | ICD-10-CM | POA: Diagnosis not present

## 2017-07-09 DIAGNOSIS — H04301 Unspecified dacryocystitis of right lacrimal passage: Secondary | ICD-10-CM | POA: Diagnosis not present

## 2017-07-09 DIAGNOSIS — D869 Sarcoidosis, unspecified: Secondary | ICD-10-CM | POA: Diagnosis not present

## 2017-07-09 DIAGNOSIS — I130 Hypertensive heart and chronic kidney disease with heart failure and stage 1 through stage 4 chronic kidney disease, or unspecified chronic kidney disease: Secondary | ICD-10-CM | POA: Diagnosis not present

## 2017-07-09 DIAGNOSIS — L03213 Periorbital cellulitis: Secondary | ICD-10-CM | POA: Diagnosis not present

## 2017-07-09 DIAGNOSIS — I5030 Unspecified diastolic (congestive) heart failure: Secondary | ICD-10-CM | POA: Diagnosis not present

## 2017-07-09 DIAGNOSIS — E1122 Type 2 diabetes mellitus with diabetic chronic kidney disease: Secondary | ICD-10-CM | POA: Diagnosis not present

## 2017-07-14 ENCOUNTER — Encounter: Admit: 2017-07-14 | Discharge: 2017-07-15 | Payer: MEDICARE

## 2017-07-14 DIAGNOSIS — L439 Lichen planus, unspecified: Secondary | ICD-10-CM | POA: Diagnosis not present

## 2017-07-14 DIAGNOSIS — Z79899 Other long term (current) drug therapy: Secondary | ICD-10-CM | POA: Diagnosis not present

## 2017-07-14 DIAGNOSIS — D869 Sarcoidosis, unspecified: Secondary | ICD-10-CM | POA: Diagnosis not present

## 2017-07-15 DIAGNOSIS — D869 Sarcoidosis, unspecified: Secondary | ICD-10-CM | POA: Diagnosis not present

## 2017-07-15 DIAGNOSIS — L03213 Periorbital cellulitis: Secondary | ICD-10-CM | POA: Diagnosis not present

## 2017-07-15 DIAGNOSIS — E1122 Type 2 diabetes mellitus with diabetic chronic kidney disease: Secondary | ICD-10-CM | POA: Diagnosis not present

## 2017-07-15 DIAGNOSIS — I130 Hypertensive heart and chronic kidney disease with heart failure and stage 1 through stage 4 chronic kidney disease, or unspecified chronic kidney disease: Secondary | ICD-10-CM | POA: Diagnosis not present

## 2017-07-15 DIAGNOSIS — I5030 Unspecified diastolic (congestive) heart failure: Secondary | ICD-10-CM | POA: Diagnosis not present

## 2017-07-15 DIAGNOSIS — H04301 Unspecified dacryocystitis of right lacrimal passage: Secondary | ICD-10-CM | POA: Diagnosis not present

## 2017-07-25 DIAGNOSIS — I5030 Unspecified diastolic (congestive) heart failure: Secondary | ICD-10-CM | POA: Diagnosis not present

## 2017-07-25 DIAGNOSIS — D869 Sarcoidosis, unspecified: Secondary | ICD-10-CM | POA: Diagnosis not present

## 2017-07-25 DIAGNOSIS — I130 Hypertensive heart and chronic kidney disease with heart failure and stage 1 through stage 4 chronic kidney disease, or unspecified chronic kidney disease: Secondary | ICD-10-CM | POA: Diagnosis not present

## 2017-07-25 DIAGNOSIS — H04301 Unspecified dacryocystitis of right lacrimal passage: Secondary | ICD-10-CM | POA: Diagnosis not present

## 2017-07-25 DIAGNOSIS — L03213 Periorbital cellulitis: Secondary | ICD-10-CM | POA: Diagnosis not present

## 2017-07-25 DIAGNOSIS — E1122 Type 2 diabetes mellitus with diabetic chronic kidney disease: Secondary | ICD-10-CM | POA: Diagnosis not present

## 2017-07-31 ENCOUNTER — Telehealth: Payer: Self-pay | Admitting: Internal Medicine

## 2017-07-31 NOTE — Telephone Encounter (Signed)
Copied from [place] [id number]. Topic: Quick Communication - Rx Refill/Question >> Jul 31, 2017  4:37 PM Neva Seat wrote: esomeprazole (NEXIUM) 40 MG capsule - pt is out of this medication since  clonazePAM (KLONOPIN) 0.5 MG tablet  Pt needing refills  [facility], [address], [place] [address] Phone: [phone] Fax: [phone]

## 2017-08-01 ENCOUNTER — Other Ambulatory Visit: Payer: Self-pay | Admitting: *Deleted

## 2017-08-04 MED ORDER — CLONAZEPAM 0.5 MG PO TABS: 0.5000 mg | ORAL_TABLET | Freq: Every day | ORAL | 3 refills | Status: DC

## 2017-08-04 NOTE — Telephone Encounter (Signed)
LOV 06/02/2017 with Dr. Alain Marion / Refill request for Nexium-  Patient should have refills at pharmacy Last written: Outpatient Medication Detail    Disp Refills Start End   esomeprazole (NEXIUM) 40 MG capsule 90 capsule 3 01/21/2017    Sig - Route: Take 1 capsule (40 mg total) by mouth daily before breakfast. - Oral    / Refill request for Klonopin Last written: Outpatient Medication Detail    Disp Refills Start End   clonazePAM (KLONOPIN) 0.5 MG tablet 60 tablet 2 01/29/2017    Sig: Take 1-2 tablets BY MOUTH at bedtime

## 2017-08-04 NOTE — Telephone Encounter (Signed)
MD approved and sent to p[facility]../lmb 

## 2017-08-04 NOTE — Telephone Encounter (Signed)
Check [place] registry last filled 04/19/2017.Marland KitchenJohny Chess

## 2017-08-07 MED ORDER — ESOMEPRAZOLE MAGNESIUM 40 MG PO CPDR: 40.0000 mg | DELAYED_RELEASE_CAPSULE | Freq: Every day | ORAL | 1 refills | Status: DC

## 2017-08-07 NOTE — Telephone Encounter (Signed)
Patient still needs nexium because the pharmacy she was using for it, she no longer is using. Please send to [facility]. Please call patient to notify her if the medication will be filled. She has been suffering with GERD since she has been out of this script.

## 2017-08-07 NOTE — Telephone Encounter (Signed)
Notified pt new rx has been sent to [facility].Marland KitchenJohny Garrison

## 2017-08-07 NOTE — Addendum Note (Signed)
Addended by: Earnstine Regal on: 08/07/2017 03:11 PM   Modules accepted: Orders

## 2017-08-18 ENCOUNTER — Other Ambulatory Visit: Payer: Self-pay | Admitting: Pharmacist

## 2017-08-18 NOTE — Patient Outreach (Signed)
[facility]) Care Management  Garrison  [facility] 11-Mar-1960 [id number]   Patient called to ask a question about pill identification. HIPAA identifiers were obtained.  Patient descried a pink, socred, round tablet with a "fish symbol" and the number 33. Using drugs.com, the tablet was identified as clonazepam 0.5mg  tablets.  Patient's medications were reviewed via telephone: Medications Reviewed Today    Reviewed by Elayne Guerin, [facility] (Pharmacist) on 08/18/17 at 1508  Med List Status: <None>  Medication Order Taking? Sig Documenting Provider Last Dose Status Informant  Adalimumab (HUMIRA) 40 MG/0.8ML PSKT [id number] Yes Inject 40 mg into the skin every 7 (seven) days.  [provider] Taking Active Self           Med Note Duffy Bruce, [name]   Fri May 23, 2017  9:00 PM) Thursdays  augmented betamethasone dipropionate (DIPROLENE-AF) 0.05 % ointment [id number] Yes Apply 1 application topically 2 (two) times daily. [provider] Taking Active Self  budesonide-formoterol (SYMBICORT) 160-4.5 MCG/ACT inhaler [id number] Yes Inhale 2 puffs into the lungs TWICE DAILY 120/4=30 Rigoberto Noel, MD Taking Active Self  carvedilol (COREG) 12.5 MG tablet [id number] Yes Take 1 tablet (12.5 mg total) by mouth 2 (two) times daily with a meal. Plotnikov, Evie Lacks, MD Taking Active Self  chlorhexidine (PERIDEX) 0.12 % solution [id number] Yes Use as directed 5 mLs in the mouth or throat 2 (two) times daily. Pt is to swish and spit. Plotnikov, Evie Lacks, MD Taking Active Self  clobetasol ointment (TEMOVATE) 0.05 % [id number] Yes Apply 1 application topically See admin instructions. Apply to the scalp two to three times a day (Lichen planus) Enriqueta Shutter, MD Taking Active Self  clonazePAM (KLONOPIN) 0.5 MG tablet [id number] Yes Take 1-2 tablets (0.5-1 mg total) by mouth at bedtime. Plotnikov, Evie Lacks, MD Taking Active   esomeprazole (NEXIUM) 40 MG capsule [id number]  Yes Take 1 capsule (40 mg total) by mouth daily before breakfast. Plotnikov, Evie Lacks, MD Taking Active   furosemide (LASIX) 40 MG tablet [id number] Yes TAKE 1 TABLET BY MOUTH EVERY MORNING FOR EDEMA Plotnikov, Evie Lacks, MD Taking Active Self  gabapentin (NEURONTIN) 300 MG capsule [id number] Yes Take 1 capsule (300 mg total) by mouth 2 (two) times daily as needed (for pain). Plotnikov, Evie Lacks, MD Taking Active Self  glucose blood (ACCU-CHEK GUIDE) test strip [id number] Yes Use as instructed to test sugars 4 times daily DX: E11.8 Philemon Kingdom, MD Taking Active   Lancets (ACCU-CHEK MULTICLIX) lancets [id number] Yes Use as instructed to check sugars 4 times daily DX: E11.8 Philemon Kingdom, MD Taking Active   metFORMIN (GLUCOPHAGE-XR) 500 MG 24 hr tablet [id number] Yes TAKE 2 TABLETS(1000 MG) BY MOUTH DAILY WITH BREAKFAST Plotnikov, Evie Lacks, MD Taking Active Self  neomycin-polymyxin b-dexamethasone (MAXITROL) 3.5-10000-0.1 SUSP [id number] Yes Place 1 drop into the right eye 2 (two) times daily. [provider] Taking Active Self           Med Note [name]   Fri Jun 20, 2017 11:24 AM) Will be started after surgery        Discontinued 09/13/13 1135 (Reorder)   potassium chloride (KLOR-CON 10) 10 MEQ tablet [id number] Yes Take 1 tablet (10 mEq total) by mouth daily. Plotnikov, Evie Lacks, MD Taking Active Self  promethazine (PHENERGAN) 6.25 MG/5ML syrup [id number]  Take 20 mLs (25 mg total) by mouth daily for 3 days. Clista Bernhardt, MD  Expired 06/29/17 2359   sitaGLIPtin (JANUVIA) 50 MG  tablet [id number] Yes Take 1 tablet (50 mg total) by mouth daily. Plotnikov, Evie Lacks, MD Taking Active Self  topiramate (TOPAMAX) 50 MG tablet [id number] Yes Take 1 tablet (50 mg total) by mouth 2 (two) times daily. Plotnikov, Evie Lacks, MD Taking Active Self  venlafaxine XR (EFFEXOR-XR) 75 MG 24 hr capsule [id number] Yes Take 1 capsule (75 mg total) by mouth daily with breakfast. Plotnikov, Evie Lacks, MD Taking Active Self  VENTOLIN HFA 108 (90 Base) MCG/ACT inhaler [id number] Yes Inhale 2 puffs into the lungs every 6 (six) hours as needed for wheezing or shortness of breath. 200/8=25 Plotnikov, Evie Lacks, MD Taking Active Self  VOLTAREN 1 % GEL [id number] Yes Apply 2 g topically 2 (two) times daily as needed (for pain). 100/4=25 Plotnikov, Evie Lacks, MD Taking Active Self  XARELTO 20 MG TABS tablet [id number] Yes Take 1 tablet (20 mg total) by mouth at bedtime. Plotnikov, Evie Lacks, MD Taking Active Self           Patient requested a social work referral so that she can get SCAT.  Plan: Home visit scheduled for 08/28/17 to review medications and consider closing case  Order for Social Work--transportation.

## 2017-08-21 ENCOUNTER — Other Ambulatory Visit: Payer: Self-pay

## 2017-08-21 NOTE — Patient Outreach (Signed)
  [place] [facility]) Care Management  08/21/2017  [name] [facility] 1960/10/18 [id number]  BSW attempted to contact patient regarding recent referral for transportation options. Unfortunately patient did not answer. BSW left a  HIPAA compliant voice message requesting a return call.  Plan: BSW will send the patient an unsuccessful outreach letter requesting contact. BSW will attempt the patient again within the next four business days.  Daneen Schick, BSW, CDP [facility] [phone]

## 2017-08-25 ENCOUNTER — Other Ambulatory Visit: Payer: Self-pay

## 2017-08-25 MED ORDER — ADALIMUMAB 40 MG/0.8 ML SUBCUTANEOUS PEN KIT
SUBCUTANEOUS | 11 refills | 0.00000 days | Status: CP
Start: 2017-08-25 — End: 2017-09-10

## 2017-08-25 NOTE — Patient Outreach (Signed)
[place] [facility]) Care Management  08/25/2017  [name] [facility] [birth year] [id number]  BSW received an incoming call from the patient, HIPAA identifiers confirmed. The patient is interested in applying for SCAT in order to obtain affordable transportation to the grocery store. The patient stated she is currently paying somone "ten dollars" to transport her to the grocery store. SCAT application completed telephonically. BSW explained the SCAT application process to the patient. BSW to mail the patient information on SCAT. BSW will contact the patient in the next 3-4 weeks to verify SCAT status.  Daneen Schick, BSW, CDP [facility] [phone]

## 2017-08-26 ENCOUNTER — Ambulatory Visit: Payer: Medicare Other

## 2017-08-26 ENCOUNTER — Ambulatory Visit: Payer: Medicare Other | Admitting: Pharmacist

## 2017-08-27 ENCOUNTER — Encounter: Payer: Self-pay | Admitting: Internal Medicine

## 2017-08-27 ENCOUNTER — Ambulatory Visit (INDEPENDENT_AMBULATORY_CARE_PROVIDER_SITE_OTHER): Payer: Medicare Other | Admitting: Internal Medicine

## 2017-08-27 DIAGNOSIS — J0100 Acute maxillary sinusitis, unspecified: Secondary | ICD-10-CM

## 2017-08-27 DIAGNOSIS — H6123 Impacted cerumen, bilateral: Secondary | ICD-10-CM

## 2017-08-27 DIAGNOSIS — H6120 Impacted cerumen, unspecified ear: Secondary | ICD-10-CM | POA: Insufficient documentation

## 2017-08-27 MED ORDER — AMOXICILLIN-POT CLAVULANATE 875-125 MG PO TABS: 1.0000 | ORAL_TABLET | Freq: Two times a day (BID) | ORAL | 0 refills | Status: DC

## 2017-08-27 NOTE — Assessment & Plan Note (Signed)
On steroids now

## 2017-08-27 NOTE — Progress Notes (Signed)
Subjective:  Patient ID: [name], female    DOB: 02-02-1960  Age: 57 y.o. MRN: [id number]  CC: No chief complaint on file.   HPI [name] presents for cough - brown sputum. C/o L ear pain, can't hear out of it. C/o HA On Prednisone per Ophthalmology  Outpatient Medications Prior to Visit  Medication Sig Dispense Refill  . Adalimumab (HUMIRA) 40 MG/0.8ML PSKT Inject 40 mg into the skin every 7 (seven) days.     [name] augmented betamethasone dipropionate (DIPROLENE-AF) 0.05 % ointment Apply 1 application topically 2 (two) times daily.  8  . budesonide-formoterol (SYMBICORT) 160-4.5 MCG/ACT inhaler Inhale 2 puffs into the lungs TWICE DAILY 120/4=30 10.2 g 5  . carvedilol (COREG) 12.5 MG tablet Take 1 tablet (12.5 mg total) by mouth 2 (two) times daily with a meal. 180 tablet 3  . chlorhexidine (PERIDEX) 0.12 % solution Use as directed 5 mLs in the mouth or throat 2 (two) times daily. Pt is to swish and spit. 120 mL 0  . clobetasol ointment (TEMOVATE) 0.93 % Apply 1 application topically See admin instructions. Apply to the scalp two to three times a day (Lichen planus)    . clonazePAM (KLONOPIN) 0.5 MG tablet Take 1-2 tablets (0.5-1 mg total) by mouth at bedtime. 60 tablet 3  . esomeprazole (NEXIUM) 40 MG capsule Take 1 capsule (40 mg total) by mouth daily before breakfast. 90 capsule 1  . furosemide (LASIX) 40 MG tablet TAKE 1 TABLET BY MOUTH EVERY MORNING FOR EDEMA 90 tablet 1  . gabapentin (NEURONTIN) 300 MG capsule Take 1 capsule (300 mg total) by mouth 2 (two) times daily as needed (for pain). 180 capsule 1  . glucose blood (ACCU-CHEK GUIDE) test strip Use as instructed to test sugars 4 times daily DX: E11.8 400 each 0  . Lancets (ACCU-CHEK MULTICLIX) lancets Use as instructed to check sugars 4 times daily DX: E11.8 400 each 0  . metFORMIN (GLUCOPHAGE-XR) 500 MG 24 hr tablet TAKE 2 TABLETS(1000 MG) BY MOUTH DAILY WITH BREAKFAST 180 tablet 1  . neomycin-polymyxin  b-dexamethasone (MAXITROL) 3.5-10000-0.1 SUSP Place 1 drop into the right eye 2 (two) times daily.  1  . potassium chloride (KLOR-CON 10) 10 MEQ tablet Take 1 tablet (10 mEq total) by mouth daily. 90 tablet 3  . sitaGLIPtin (JANUVIA) 50 MG tablet Take 1 tablet (50 mg total) by mouth daily. 90 tablet 0  . topiramate (TOPAMAX) 50 MG tablet Take 1 tablet (50 mg total) by mouth 2 (two) times daily. 180 tablet 1  . venlafaxine XR (EFFEXOR-XR) 75 MG 24 hr capsule Take 1 capsule (75 mg total) by mouth daily with breakfast. 30 capsule 11  . VENTOLIN HFA 108 (90 Base) MCG/ACT inhaler Inhale 2 puffs into the lungs every 6 (six) hours as needed for wheezing or shortness of breath. 200/8=25 18 g 11  . VOLTAREN 1 % GEL Apply 2 g topically 2 (two) times daily as needed (for pain). 100/4=25 100 g 2  . XARELTO 20 MG TABS tablet Take 1 tablet (20 mg total) by mouth at bedtime. 90 tablet 3  . promethazine (PHENERGAN) 6.25 MG/5ML syrup Take 20 mLs (25 mg total) by mouth daily for 3 days. 120 mL 1   No facility-administered medications prior to visit.     ROS: Review of Systems  Constitutional: Negative for activity change, appetite change, chills, fatigue and unexpected weight change.  HENT: Positive for congestion, sinus pressure and sinus pain. Negative for mouth  sores.   Eyes: Negative for visual disturbance.  Respiratory: Positive for cough and shortness of breath. Negative for chest tightness.   Gastrointestinal: Negative for abdominal pain and nausea.  Genitourinary: Negative for difficulty urinating, frequency and vaginal pain.  Musculoskeletal: Positive for arthralgias, back pain and gait problem.  Skin: Negative for pallor and rash.  Neurological: Positive for headaches. Negative for dizziness, tremors, weakness and numbness.  Psychiatric/Behavioral: Negative for confusion and sleep disturbance.    Objective:  BP 132/78 (BP Location: Left Arm, Patient Position: Sitting, Cuff Size: Large)   Pulse  63   Temp 98 F (36.7 C) (Oral)   Ht 5\' 7"  (1.702 m)   Wt 280 lb (127 kg)   SpO2 98% Comment: 2 liters of o2  BMI 43.85 kg/m   BP Readings from Last 3 Encounters:  08/27/17 132/78  06/30/17 130/88  06/26/17 104/65    Wt Readings from Last 3 Encounters:  08/27/17 280 lb (127 kg)  06/30/17 273 lb (123.8 kg)  06/26/17 272 lb (123.4 kg)    Physical Exam  Constitutional: She appears well-developed. No distress.  HENT:  Head: Normocephalic.  Right Ear: External ear normal.  Left Ear: External ear normal.  Nose: Nose normal.  Mouth/Throat: Oropharynx is clear and moist.  Eyes: Pupils are equal, round, and reactive to light. Conjunctivae are normal. Right eye exhibits no discharge. Left eye exhibits no discharge.  Neck: Normal range of motion. Neck supple. No JVD present. No tracheal deviation present. No thyromegaly present.  Cardiovascular: Normal rate, regular rhythm and normal heart sounds.  Pulmonary/Chest: No stridor. No respiratory distress. She has no wheezes.  Abdominal: Soft. Bowel sounds are normal. She exhibits no distension and no mass. There is no tenderness. There is no rebound and no guarding.  Musculoskeletal: She exhibits no edema or tenderness.  Lymphadenopathy:    She has no cervical adenopathy.  Neurological: She displays normal reflexes. No cranial nerve deficit. She exhibits normal muscle tone. Coordination abnormal.  Skin: No rash noted. No erythema.  Psychiatric: She has a normal mood and affect. Her behavior is normal. Judgment and thought content normal.  obese Pt showed me brown sputum on a paper towel Cane Wax B   Procedure Note :     Procedure :  Ear irrigation   Indication:  Cerumen impaction B   Risks, including pain, dizziness, eardrum perforation, bleeding, infection and others as well as benefits were explained to the patient in detail. Verbal consent was obtained and the patient agreed to proceed.    We used "The Elephant Ear Irrigation  Device" filled with lukewarm water for irrigation. A large amount wax was recovered. Procedure has also required manual wax removal with an ear wax curette and ear forceps.   Tolerated well. Complications: None.   Postprocedure instructions :  Call if problems.    Lab Results  Component Value Date   WBC 6.4 06/26/2017   HGB 11.6 (L) 06/26/2017   HCT 38.6 06/26/2017   PLT 243 06/26/2017   GLUCOSE 131 (H) 06/26/2017   CHOL 237 (H) 06/30/2017   TRIG 271.0 (H) 06/30/2017   HDL 70.00 06/30/2017   LDLDIRECT 107.0 06/30/2017   LDLCALC 97 06/29/2008   ALT 19 05/23/2017   AST 27 05/23/2017   NA 139 06/26/2017   K 4.0 06/26/2017   CL 107 06/26/2017   CREATININE 1.38 (H) 06/26/2017   BUN 20 06/26/2017   CO2 22 06/26/2017   TSH 2.31 08/23/2016   INR 0.93 06/26/2017  HGBA1C 5.8 (H) 06/26/2017   MICROALBUR <0.7 02/01/2015    No results found.  Assessment & Plan:   There are no diagnoses linked to this encounter.   No orders of the defined types were placed in this encounter.    Follow-up: No follow-ups on file.  Walker Kehr, MD

## 2017-08-27 NOTE — Assessment & Plan Note (Signed)
PO Augmentin

## 2017-08-27 NOTE — Assessment & Plan Note (Signed)
Will irrigate 

## 2017-08-28 ENCOUNTER — Other Ambulatory Visit: Payer: Self-pay | Admitting: Pharmacist

## 2017-08-28 NOTE — Patient Outreach (Addendum)
[facility]) Care Management  [place]   08/28/2017  [name] [facility] 11/15/1960 [id number]  Subjective: Home visit completed at patient's home.  HIPAA identifiers were obtained x2.  Patient is a 57 year old female with multiple medical conditions including but not limited to:   COPD, vitamin b 12 deficiency, sarcoidosis, CAD, allergic rhinitis, type 2 diabetes, history of DVT, hypertension, GERD, obesity, Pulmonary Embolism, and vitamin D deficiency. Patient was recently hospitalized for right sided dacrocystitis/retropharyngeal lymphadenopathy. She underwent lacrimal duct surgery in June.     Patient manages her medications on her own and now receives her medications via delivery from [facility] without any issues.  Objective:  HgA1c-5.8 (6/19) TG 271 LDL 107  Encounter Medications: Outpatient Encounter Medications as of 08/28/2017  Medication Sig Note  . Adalimumab (HUMIRA) 40 MG/0.8ML PSKT Inject 40 mg into the skin every 7 (seven) days.  05/23/2017: Thursdays  . amoxicillin-clavulanate (AUGMENTIN) 875-125 MG tablet Take 1 tablet by mouth 2 (two) times daily.   . budesonide-formoterol (SYMBICORT) 160-4.5 MCG/ACT inhaler Inhale 2 puffs into the lungs TWICE DAILY 120/4=30   . carvedilol (COREG) 12.5 MG tablet Take 1 tablet (12.5 mg total) by mouth 2 (two) times daily with a meal.   . chlorhexidine (PERIDEX) 0.12 % solution Use as directed 5 mLs in the mouth or throat 2 (two) times daily. Pt is to swish and spit.   . clonazePAM (KLONOPIN) 0.5 MG tablet Take 1-2 tablets (0.5-1 mg total) by mouth at bedtime.   [name] esomeprazole (NEXIUM) 40 MG capsule Take 1 capsule (40 mg total) by mouth daily before breakfast.   . furosemide (LASIX) 40 MG tablet TAKE 1 TABLET BY MOUTH EVERY MORNING FOR EDEMA   . gabapentin (NEURONTIN) 300 MG capsule Take 1 capsule (300 mg total) by mouth 2 (two) times daily as needed (for pain).   [name] glucose blood (ACCU-CHEK GUIDE) test  strip Use as instructed to test sugars 4 times daily DX: E11.8   . Lancets (ACCU-CHEK MULTICLIX) lancets Use as instructed to check sugars 4 times daily DX: E11.8   . metFORMIN (GLUCOPHAGE-XR) 500 MG 24 hr tablet TAKE 2 TABLETS(1000 MG) BY MOUTH DAILY WITH BREAKFAST   . neomycin-polymyxin b-dexamethasone (MAXITROL) 3.5-10000-0.1 SUSP Place 1 drop into the right eye 2 (two) times daily. 06/20/2017: Will be started after surgery  . potassium chloride (KLOR-CON 10) 10 MEQ tablet Take 1 tablet (10 mEq total) by mouth daily.   . predniSONE (DELTASONE) 5 MG tablet Take 10 mg by mouth 2 (two) times daily with a meal.   . sitaGLIPtin (JANUVIA) 50 MG tablet Take 1 tablet (50 mg total) by mouth daily.   [name] topiramate (TOPAMAX) 50 MG tablet Take 1 tablet (50 mg total) by mouth 2 (two) times daily.   [name] venlafaxine XR (EFFEXOR-XR) 75 MG 24 hr capsule Take 1 capsule (75 mg total) by mouth daily with breakfast.   . VENTOLIN HFA 108 (90 Base) MCG/ACT inhaler Inhale 2 puffs into the lungs every 6 (six) hours as needed for wheezing or shortness of breath. 200/8=25   . VOLTAREN 1 % GEL Apply 2 g topically 2 (two) times daily as needed (for pain). 100/4=25   . XARELTO 20 MG TABS tablet Take 1 tablet (20 mg total) by mouth at bedtime.   [name] augmented betamethasone dipropionate (DIPROLENE-AF) 0.05 % ointment Apply 1 application topically 2 (two) times daily.   . clobetasol ointment (TEMOVATE) 6.22 % Apply 1 application topically See admin instructions.  Apply to the scalp two to three times a day (Lichen planus)   . promethazine (PHENERGAN) 6.25 MG/5ML syrup Take 20 mLs (25 mg total) by mouth daily for 3 days.   . [DISCONTINUED] potassium chloride (K-DUR,KLOR-CON) 10 MEQ tablet TAKE 1 TABLET BY MOUTH DAILY    No facility-administered encounter medications on file as of 08/28/2017.     Functional Status: In your present state of health, do you have any difficulty performing the following activities: 05/23/2017 01/15/2017   Hearing? N N  Vision? N N  Difficulty concentrating or making decisions? N N  Walking or climbing stairs? Y Y  Dressing or bathing? N Y  Doing errands, shopping? Y Y  Conservation officer, nature and eating ? - N  Using the Toilet? - N  In the past six months, have you accidently leaked urine? - N  Do you have problems with loss of bowel control? - N  Managing your Medications? - N  Managing your Finances? - Y  Housekeeping or managing your Housekeeping? - Y  Some recent data might be hidden    Fall/Depression Screening: Fall Risk  02/17/2017 01/15/2017 12/19/2016  Falls in the past year? Yes Yes Yes  Number falls in past yr: 2 or more 2 or more 2 or more  Injury with Fall? Yes Yes Yes  Risk Factor Category  High Fall Risk High Fall Risk -  Risk for fall due to : History of fall(s);Medication side effect;Impaired balance/gait;Impaired mobility History of fall(s);Impaired balance/gait;Impaired mobility Impaired mobility  Follow up Falls evaluation completed;Education provided;Falls prevention discussed Education provided;Falls prevention discussed Falls prevention discussed   PHQ 2/9 Scores 02/17/2017 01/15/2017 12/19/2016 11/06/2016 10/24/2016 07/08/2016 06/26/2016  PHQ - 2 Score 2 1 5 2  0 1 1  PHQ- 9 Score 7 - 9 7 - - -      Assessment: Drugs sorted by system:  Neurologic/Psychologic: Gabapentin Clonazepam Topiramate  Venlafaxine XR   Cardiovascular: Carvedilol Furosemide Xarelto   Pulmonary/Allergy: Symbicort Ventolin HFA  Gastrointestinal: Esomeprazole   Endocrine: Metformin Januvia  Topical: Clobetasol ointment (patient reported not using) Augmented betamethasone (patient said not using) Diclofenac gel  Vitamins/Minerals: Potassium Chloride  Infectious Diseases: Amox/Clav Neomycin-polymixin B-betamethasone drops  Miscellaneous: Chlorhexidine  Humira Prednisone   Gaps in therapy:  Patient with diabetes not on a statin LDL 107 TG 241  Refill  needed:  Humira (left message at Dr. Louanne Skye office in [place], )  Also called the [facility] program and requested the forms be refaxed to Dr. Louanne Skye office.  Plan: Route note to Dr. Alain Marion and bring attention to patient having diabetes but not having a statin on board.  Close patient's pharmacy case.  Alert Social Worker, Daneen Schick who has been working with the patient on transportation.  Elayne Guerin, PharmD, [place] Clinical Pharmacist [phone]

## 2017-09-02 ENCOUNTER — Ambulatory Visit: Payer: Self-pay | Admitting: Internal Medicine

## 2017-09-02 NOTE — Telephone Encounter (Signed)
Patient is calling to report that she did have the diarrhea with the antibiotic as warned - but now she has started having nausea and vomiting. Patient also states she is having stomach pain. Patient is diabetic and is concerned about her glucose levels if she can not keep food down. Patient states she has had 3 episodes of diarrhea already today. She is trying to stay hydrated- but she is vomiting everything at this point. Patient started the antibiotic on Thursday and diarrhea started late Thursday night. Vomiting started yesterday morning. Patient reports no change in her symptoms. Call to office- will send call for review to see if medication needs to be changed- or patient needs medication for her symptoms. Patient counciled on replacement of electrolytes with frequent diarrhea and vomiting.Best contact for patient # [phone]. Patient uses [facility] and they deliver as long as Rx is called in before 12:00 noon.  Reason for Disposition . Caller has URGENT medication question about med that PCP prescribed and triager unable to answer question  Answer Assessment - Initial Assessment Questions 1. SYMPTOMS: "Do you have any symptoms?"     Nausea and vomiting 2. SEVERITY: If symptoms are present, ask "Are they mild, moderate or severe?"     severe  Protocols used: MEDICATION QUESTION CALL-A-AH

## 2017-09-02 NOTE — Telephone Encounter (Signed)
Routing to dr plotnikov---patient is afebrile, not able to hold down anything, has not taken metformin, but glucose reading is ok at 141---has already stopped taking abx, has no transportation to come in, but is requesting that we send something into [facility] for nausea and right lower quadrant pain---patient has already had appendectomy in the past, reports no problems with urination, patient advised we may not be able to send anything in for pain without seeing her, routing to dr plotnikov, can you send something in for nausea and pain?  Please advise, I will call patient back, thanks

## 2017-09-03 MED ORDER — PROMETHAZINE HCL 25 MG PO TABS: 25.0000 mg | ORAL_TABLET | Freq: Three times a day (TID) | ORAL | 0 refills | Status: DC | PRN

## 2017-09-03 NOTE — Telephone Encounter (Signed)
Ok Phenergan To ER if problems Thx

## 2017-09-03 NOTE — Addendum Note (Signed)
Addended by: Cassandria Anger on: 09/03/2017 07:36 AM   Modules accepted: Orders

## 2017-09-03 NOTE — Telephone Encounter (Signed)
Left message advising patient that rx has been sent to pharm, per previous conv w/pt, her pharmacy will deliver

## 2017-09-10 ENCOUNTER — Other Ambulatory Visit: Payer: Self-pay | Admitting: Internal Medicine

## 2017-09-10 ENCOUNTER — Other Ambulatory Visit: Payer: Self-pay

## 2017-09-10 MED ORDER — ADALIMUMAB 40 MG/0.8 ML SUBCUTANEOUS PEN KIT
SUBCUTANEOUS | 11 refills | 0.00000 days | Status: CP
Start: 2017-09-10 — End: 2017-09-11

## 2017-09-10 NOTE — Patient Outreach (Signed)
[facility]) Care Management  09/10/2017  Giles 1960-07-23 [id number]  Return call to the patient who left this BSW a voice message earlier today. The patient states "I wasn't able to make my appointment with [facility]. I have been sick". BSW encouraged the patient to outreach [facility] eligibility office upon feeling better in efforts to reschedule appointment. The patient stated understanding.  The patient indicated she had contacted this BSW for assistance with glucometer supplies. The patient stated "how do I go about finding out what kind of strips and lancets my insurance covers". The patient further discloses the pharmacy she has accessed in the past has closed and her new pharmacy needs a prescription for supplies. BSW explained to the patient the prescription comes from her physician. BSW encouraged the patient to contact her physician office to request a prescription for lancets and test strips be sent to the appropriate pharmacy. The patient stated understanding.  Plan: BSW to outreach the patient in the next two weeks to confirm [facility] interview has been rescheduled.  Daneen Schick, BSW, CDP [facility] [phone]

## 2017-09-11 ENCOUNTER — Telehealth: Payer: Self-pay | Admitting: Internal Medicine

## 2017-09-11 MED ORDER — ADALIMUMAB 40 MG/0.8 ML SUBCUTANEOUS PEN KIT
SUBCUTANEOUS | 11 refills | 0.00000 days | Status: CP
Start: 2017-09-11 — End: 2018-02-23

## 2017-09-11 MED ORDER — GLUCOSE BLOOD VI STRP: ORAL_STRIP | 1 refills | Status: DC

## 2017-09-11 NOTE — Telephone Encounter (Signed)
Sent the test strips.

## 2017-09-11 NOTE — Telephone Encounter (Signed)
Per [facility]-Patient needs RX for EZ Plus 2 test strips sent to [facility] on [address] in [place]. Patient now uses EZ Plus 2 Glucometer per her insurance coverage.

## 2017-09-12 ENCOUNTER — Ambulatory Visit: Payer: Self-pay | Admitting: Internal Medicine

## 2017-09-12 ENCOUNTER — Other Ambulatory Visit: Payer: Self-pay

## 2017-09-12 ENCOUNTER — Inpatient Hospital Stay (HOSPITAL_COMMUNITY): Payer: Medicare Other

## 2017-09-12 ENCOUNTER — Emergency Department (HOSPITAL_COMMUNITY): Payer: Medicare Other

## 2017-09-12 ENCOUNTER — Telehealth: Payer: Self-pay | Admitting: Internal Medicine

## 2017-09-12 ENCOUNTER — Inpatient Hospital Stay (HOSPITAL_COMMUNITY)
Admission: EM | Admit: 2017-09-12 | Discharge: 2017-09-19 | DRG: 871 | Disposition: A | Discharge status: Home Health | Payer: Medicare Other | Attending: Internal Medicine | Admitting: Internal Medicine

## 2017-09-12 ENCOUNTER — Encounter (HOSPITAL_COMMUNITY): Payer: Self-pay

## 2017-09-12 DIAGNOSIS — K810 Acute cholecystitis: Secondary | ICD-10-CM | POA: Diagnosis present

## 2017-09-12 DIAGNOSIS — Z91040 Latex allergy status: Secondary | ICD-10-CM

## 2017-09-12 DIAGNOSIS — D869 Sarcoidosis, unspecified: Secondary | ICD-10-CM | POA: Diagnosis not present

## 2017-09-12 DIAGNOSIS — Z96653 Presence of artificial knee joint, bilateral: Secondary | ICD-10-CM | POA: Diagnosis present

## 2017-09-12 DIAGNOSIS — D863 Sarcoidosis of skin: Secondary | ICD-10-CM | POA: Diagnosis present

## 2017-09-12 DIAGNOSIS — A4102 Sepsis due to Methicillin resistant Staphylococcus aureus: Secondary | ICD-10-CM | POA: Diagnosis not present

## 2017-09-12 DIAGNOSIS — A419 Sepsis, unspecified organism: Secondary | ICD-10-CM | POA: Diagnosis present

## 2017-09-12 DIAGNOSIS — Z9981 Dependence on supplemental oxygen: Secondary | ICD-10-CM

## 2017-09-12 DIAGNOSIS — D860 Sarcoidosis of lung: Secondary | ICD-10-CM | POA: Diagnosis present

## 2017-09-12 DIAGNOSIS — R0902 Hypoxemia: Secondary | ICD-10-CM | POA: Diagnosis not present

## 2017-09-12 DIAGNOSIS — E66813 Obesity, class 3: Secondary | ICD-10-CM | POA: Diagnosis present

## 2017-09-12 DIAGNOSIS — I2782 Chronic pulmonary embolism: Secondary | ICD-10-CM | POA: Diagnosis not present

## 2017-09-12 DIAGNOSIS — Z86718 Personal history of other venous thrombosis and embolism: Secondary | ICD-10-CM

## 2017-09-12 DIAGNOSIS — Z7951 Long term (current) use of inhaled steroids: Secondary | ICD-10-CM

## 2017-09-12 DIAGNOSIS — K750 Abscess of liver: Secondary | ICD-10-CM | POA: Diagnosis present

## 2017-09-12 DIAGNOSIS — I10 Essential (primary) hypertension: Secondary | ICD-10-CM | POA: Diagnosis present

## 2017-09-12 DIAGNOSIS — J449 Chronic obstructive pulmonary disease, unspecified: Secondary | ICD-10-CM | POA: Diagnosis present

## 2017-09-12 DIAGNOSIS — R1011 Right upper quadrant pain: Secondary | ICD-10-CM | POA: Diagnosis not present

## 2017-09-12 DIAGNOSIS — I5189 Other ill-defined heart diseases: Secondary | ICD-10-CM | POA: Diagnosis not present

## 2017-09-12 DIAGNOSIS — E669 Obesity, unspecified: Secondary | ICD-10-CM | POA: Diagnosis not present

## 2017-09-12 DIAGNOSIS — M797 Fibromyalgia: Secondary | ICD-10-CM | POA: Diagnosis present

## 2017-09-12 DIAGNOSIS — E1122 Type 2 diabetes mellitus with diabetic chronic kidney disease: Secondary | ICD-10-CM | POA: Diagnosis present

## 2017-09-12 DIAGNOSIS — E119 Type 2 diabetes mellitus without complications: Secondary | ICD-10-CM | POA: Diagnosis not present

## 2017-09-12 DIAGNOSIS — Z87891 Personal history of nicotine dependence: Secondary | ICD-10-CM

## 2017-09-12 DIAGNOSIS — Z7984 Long term (current) use of oral hypoglycemic drugs: Secondary | ICD-10-CM

## 2017-09-12 DIAGNOSIS — Z7901 Long term (current) use of anticoagulants: Secondary | ICD-10-CM

## 2017-09-12 DIAGNOSIS — F339 Major depressive disorder, recurrent, unspecified: Secondary | ICD-10-CM | POA: Diagnosis present

## 2017-09-12 DIAGNOSIS — E876 Hypokalemia: Secondary | ICD-10-CM | POA: Diagnosis not present

## 2017-09-12 DIAGNOSIS — N182 Chronic kidney disease, stage 2 (mild): Secondary | ICD-10-CM | POA: Diagnosis present

## 2017-09-12 DIAGNOSIS — I959 Hypotension, unspecified: Secondary | ICD-10-CM | POA: Diagnosis present

## 2017-09-12 DIAGNOSIS — K819 Cholecystitis, unspecified: Secondary | ICD-10-CM | POA: Diagnosis not present

## 2017-09-12 DIAGNOSIS — Z6841 Body Mass Index (BMI) 40.0 and over, adult: Secondary | ICD-10-CM | POA: Diagnosis not present

## 2017-09-12 DIAGNOSIS — I5032 Chronic diastolic (congestive) heart failure: Secondary | ICD-10-CM | POA: Diagnosis present

## 2017-09-12 DIAGNOSIS — I2510 Atherosclerotic heart disease of native coronary artery without angina pectoris: Secondary | ICD-10-CM | POA: Diagnosis present

## 2017-09-12 DIAGNOSIS — J9611 Chronic respiratory failure with hypoxia: Secondary | ICD-10-CM | POA: Diagnosis present

## 2017-09-12 DIAGNOSIS — I130 Hypertensive heart and chronic kidney disease with heart failure and stage 1 through stage 4 chronic kidney disease, or unspecified chronic kidney disease: Secondary | ICD-10-CM | POA: Diagnosis present

## 2017-09-12 DIAGNOSIS — R05 Cough: Secondary | ICD-10-CM | POA: Diagnosis not present

## 2017-09-12 DIAGNOSIS — Z888 Allergy status to other drugs, medicaments and biological substances status: Secondary | ICD-10-CM

## 2017-09-12 DIAGNOSIS — D72829 Elevated white blood cell count, unspecified: Secondary | ICD-10-CM | POA: Diagnosis not present

## 2017-09-12 DIAGNOSIS — Z8673 Personal history of transient ischemic attack (TIA), and cerebral infarction without residual deficits: Secondary | ICD-10-CM

## 2017-09-12 DIAGNOSIS — G4700 Insomnia, unspecified: Secondary | ICD-10-CM | POA: Diagnosis present

## 2017-09-12 DIAGNOSIS — Z86711 Personal history of pulmonary embolism: Secondary | ICD-10-CM

## 2017-09-12 DIAGNOSIS — Z8249 Family history of ischemic heart disease and other diseases of the circulatory system: Secondary | ICD-10-CM

## 2017-09-12 DIAGNOSIS — K219 Gastro-esophageal reflux disease without esophagitis: Secondary | ICD-10-CM | POA: Diagnosis present

## 2017-09-12 DIAGNOSIS — R109 Unspecified abdominal pain: Secondary | ICD-10-CM | POA: Diagnosis not present

## 2017-09-12 DIAGNOSIS — K8000 Calculus of gallbladder with acute cholecystitis without obstruction: Secondary | ICD-10-CM | POA: Diagnosis present

## 2017-09-12 DIAGNOSIS — R51 Headache: Secondary | ICD-10-CM | POA: Diagnosis not present

## 2017-09-12 DIAGNOSIS — D8689 Sarcoidosis of other sites: Secondary | ICD-10-CM | POA: Diagnosis not present

## 2017-09-12 DIAGNOSIS — G4733 Obstructive sleep apnea (adult) (pediatric): Secondary | ICD-10-CM | POA: Diagnosis present

## 2017-09-12 DIAGNOSIS — M069 Rheumatoid arthritis, unspecified: Secondary | ICD-10-CM | POA: Diagnosis present

## 2017-09-12 DIAGNOSIS — G43909 Migraine, unspecified, not intractable, without status migrainosus: Secondary | ICD-10-CM | POA: Diagnosis present

## 2017-09-12 DIAGNOSIS — E8809 Other disorders of plasma-protein metabolism, not elsewhere classified: Secondary | ICD-10-CM | POA: Diagnosis present

## 2017-09-12 DIAGNOSIS — R1111 Vomiting without nausea: Secondary | ICD-10-CM | POA: Diagnosis not present

## 2017-09-12 DIAGNOSIS — Z91048 Other nonmedicinal substance allergy status: Secondary | ICD-10-CM

## 2017-09-12 DIAGNOSIS — K828 Other specified diseases of gallbladder: Secondary | ICD-10-CM | POA: Diagnosis not present

## 2017-09-12 DIAGNOSIS — N189 Chronic kidney disease, unspecified: Secondary | ICD-10-CM | POA: Diagnosis not present

## 2017-09-12 DIAGNOSIS — K760 Fatty (change of) liver, not elsewhere classified: Secondary | ICD-10-CM | POA: Diagnosis not present

## 2017-09-12 DIAGNOSIS — F419 Anxiety disorder, unspecified: Secondary | ICD-10-CM | POA: Diagnosis present

## 2017-09-12 DIAGNOSIS — E860 Dehydration: Secondary | ICD-10-CM | POA: Diagnosis not present

## 2017-09-12 DIAGNOSIS — R110 Nausea: Secondary | ICD-10-CM | POA: Diagnosis not present

## 2017-09-12 DIAGNOSIS — Z79899 Other long term (current) drug therapy: Secondary | ICD-10-CM

## 2017-09-12 DIAGNOSIS — Z934 Other artificial openings of gastrointestinal tract status: Secondary | ICD-10-CM | POA: Diagnosis not present

## 2017-09-12 DIAGNOSIS — I2699 Other pulmonary embolism without acute cor pulmonale: Secondary | ICD-10-CM | POA: Diagnosis present

## 2017-09-12 DIAGNOSIS — R1084 Generalized abdominal pain: Secondary | ICD-10-CM | POA: Diagnosis not present

## 2017-09-12 DIAGNOSIS — R197 Diarrhea, unspecified: Secondary | ICD-10-CM | POA: Diagnosis not present

## 2017-09-12 LAB — GLUCOSE, CAPILLARY: Glucose-Capillary: 104 mg/dL — ABNORMAL HIGH (ref 70–99)

## 2017-09-12 LAB — CBC WITH DIFFERENTIAL/PLATELET
BASOS ABS: 0 10*3/uL (ref 0.0–0.1)
BASOS PCT: 0 %
EOS ABS: 0.1 10*3/uL (ref 0.0–0.7)
EOS PCT: 0 %
HCT: 30.7 % — ABNORMAL LOW (ref 36.0–46.0)
Hemoglobin: 9.6 g/dL — ABNORMAL LOW (ref 12.0–15.0)
Lymphocytes Relative: 9 %
Lymphs Abs: 1.9 10*3/uL (ref 0.7–4.0)
MCH: 28.4 pg (ref 26.0–34.0)
MCHC: 31.3 g/dL (ref 30.0–36.0)
MCV: 90.8 fL (ref 78.0–100.0)
MONO ABS: 1 10*3/uL (ref 0.1–1.0)
MONOS PCT: 5 %
NEUTROS ABS: 19 10*3/uL — AB (ref 1.7–7.7)
Neutrophils Relative %: 86 %
PLATELETS: 349 10*3/uL (ref 150–400)
RBC: 3.38 MIL/uL — ABNORMAL LOW (ref 3.87–5.11)
RDW: 15.5 % (ref 11.5–15.5)
WBC: 21.9 10*3/uL — ABNORMAL HIGH (ref 4.0–10.5)

## 2017-09-12 LAB — COMPREHENSIVE METABOLIC PANEL
ALBUMIN: 2.3 g/dL — AB (ref 3.5–5.0)
ALT: 94 U/L — ABNORMAL HIGH (ref 0–44)
ANION GAP: 9 (ref 5–15)
AST: 148 U/L — AB (ref 15–41)
Alkaline Phosphatase: 217 U/L — ABNORMAL HIGH (ref 38–126)
BILIRUBIN TOTAL: 3.3 mg/dL — AB (ref 0.3–1.2)
BUN: 11 mg/dL (ref 6–20)
CHLORIDE: 105 mmol/L (ref 98–111)
CO2: 24 mmol/L (ref 22–32)
Calcium: 8.1 mg/dL — ABNORMAL LOW (ref 8.9–10.3)
Creatinine, Ser: 1.19 mg/dL — ABNORMAL HIGH (ref 0.44–1.00)
GFR calc Af Amer: 58 mL/min — ABNORMAL LOW (ref >60)
GFR calc non Af Amer: 50 mL/min — ABNORMAL LOW (ref >60)
GLUCOSE: 129 mg/dL — AB (ref 70–99)
POTASSIUM: 3.8 mmol/L (ref 3.5–5.1)
SODIUM: 138 mmol/L (ref 135–145)
TOTAL PROTEIN: 7.1 g/dL (ref 6.5–8.1)

## 2017-09-12 LAB — URINALYSIS, ROUTINE W REFLEX MICROSCOPIC
BACTERIA UA: NONE SEEN
GLUCOSE, UA: NEGATIVE mg/dL
Ketones, ur: NEGATIVE mg/dL
Leukocytes, UA: NEGATIVE
NITRITE: NEGATIVE
PH: 6 (ref 5.0–8.0)
PROTEIN: NEGATIVE mg/dL
Specific Gravity, Urine: 1.044 — ABNORMAL HIGH (ref 1.005–1.030)

## 2017-09-12 LAB — BRAIN NATRIURETIC PEPTIDE: B Natriuretic Peptide: 204.1 pg/mL — ABNORMAL HIGH (ref 0.0–100.0)

## 2017-09-12 LAB — LIPASE, BLOOD: Lipase: 34 U/L (ref 11–51)

## 2017-09-12 LAB — I-STAT CG4 LACTIC ACID, ED
LACTIC ACID, VENOUS: 0.68 mmol/L (ref 0.5–1.9)
Lactic Acid, Venous: 1.05 mmol/L (ref 0.5–1.9)

## 2017-09-12 MED ORDER — PIPERACILLIN-TAZOBACTAM 3.375 G IVPB
3.3750 g | Freq: Three times a day (TID) | INTRAVENOUS | Status: DC
  Administered 2017-09-13 – 2017-09-19 (×19): 3.375 g via INTRAVENOUS
  Filled 2017-09-12 (×21): qty 50

## 2017-09-12 MED ORDER — VANCOMYCIN HCL 10 G IV SOLR
2000.0000 mg | Freq: Once | INTRAVENOUS | Status: AC
  Administered 2017-09-12: 2000 mg via INTRAVENOUS
  Filled 2017-09-12: qty 2000

## 2017-09-12 MED ORDER — IPRATROPIUM-ALBUTEROL 0.5-2.5 (3) MG/3ML IN SOLN
3.0000 mL | Freq: Four times a day (QID) | RESPIRATORY_TRACT | Status: DC | PRN
  Filled 2017-09-12: qty 3

## 2017-09-12 MED ORDER — MOMETASONE FURO-FORMOTEROL FUM 200-5 MCG/ACT IN AERO
2.0000 | INHALATION_SPRAY | Freq: Two times a day (BID) | RESPIRATORY_TRACT | Status: DC
  Administered 2017-09-13 – 2017-09-19 (×12): 2 via RESPIRATORY_TRACT
  Filled 2017-09-12: qty 8.8

## 2017-09-12 MED ORDER — HYDROMORPHONE HCL 1 MG/ML IJ SOLN
1.0000 mg | Freq: Once | INTRAMUSCULAR | Status: AC
  Administered 2017-09-12: 1 mg via INTRAVENOUS
  Filled 2017-09-12: qty 1

## 2017-09-12 MED ORDER — ACETAMINOPHEN 500 MG PO TABS
1000.0000 mg | ORAL_TABLET | Freq: Once | ORAL | Status: AC
  Administered 2017-09-12: 1000 mg via ORAL
  Filled 2017-09-12: qty 2

## 2017-09-12 MED ORDER — PIPERACILLIN-TAZOBACTAM 3.375 G IVPB 30 MIN
3.3750 g | Freq: Once | INTRAVENOUS | Status: AC
  Administered 2017-09-12: 3.375 g via INTRAVENOUS
  Filled 2017-09-12: qty 50

## 2017-09-12 MED ORDER — INSULIN ASPART 100 UNIT/ML ~~LOC~~ SOLN
0.0000 [IU] | Freq: Three times a day (TID) | SUBCUTANEOUS | Status: DC
  Administered 2017-09-15 – 2017-09-16 (×3): 2 [IU] via SUBCUTANEOUS

## 2017-09-12 MED ORDER — INSULIN ASPART 100 UNIT/ML ~~LOC~~ SOLN: 0.0000 [IU] | Freq: Every day | SUBCUTANEOUS | Status: DC

## 2017-09-12 MED ORDER — SODIUM CHLORIDE 0.9 % IV BOLUS
500.0000 mL | Freq: Once | INTRAVENOUS | Status: AC
  Administered 2017-09-12: 500 mL via INTRAVENOUS

## 2017-09-12 MED ORDER — IOPAMIDOL (ISOVUE-300) INJECTION 61%
INTRAVENOUS | Status: AC
  Filled 2017-09-12: qty 100

## 2017-09-12 MED ORDER — HEPARIN (PORCINE) IN NACL 100-0.45 UNIT/ML-% IJ SOLN
1700.0000 [IU]/h | INTRAMUSCULAR | Status: DC
  Administered 2017-09-12 – 2017-09-13 (×3): 1600 [IU]/h via INTRAVENOUS
  Administered 2017-09-14: 1700 [IU]/h via INTRAVENOUS
  Filled 2017-09-12 (×3): qty 250

## 2017-09-12 MED ORDER — HYDROMORPHONE HCL 1 MG/ML IJ SOLN
1.0000 mg | INTRAMUSCULAR | Status: DC | PRN
  Administered 2017-09-12 – 2017-09-16 (×15): 1 mg via INTRAVENOUS
  Filled 2017-09-12 (×16): qty 1

## 2017-09-12 MED ORDER — ONDANSETRON HCL 4 MG PO TABS: 4.0000 mg | ORAL_TABLET | Freq: Four times a day (QID) | ORAL | Status: DC | PRN

## 2017-09-12 MED ORDER — ONDANSETRON HCL 4 MG/2ML IJ SOLN
4.0000 mg | Freq: Four times a day (QID) | INTRAMUSCULAR | Status: DC | PRN
  Administered 2017-09-12 – 2017-09-16 (×5): 4 mg via INTRAVENOUS
  Filled 2017-09-12 (×5): qty 2

## 2017-09-12 MED ORDER — BOOST / RESOURCE BREEZE PO LIQD CUSTOM
1.0000 | Freq: Three times a day (TID) | ORAL | Status: DC
  Administered 2017-09-12 – 2017-09-18 (×7): 1 via ORAL

## 2017-09-12 MED ORDER — SODIUM CHLORIDE 0.9 % IV BOLUS
1000.0000 mL | Freq: Once | INTRAVENOUS | Status: AC
  Administered 2017-09-12: 1000 mL via INTRAVENOUS

## 2017-09-12 MED ORDER — SODIUM CHLORIDE 0.9 % IV SOLN
INTRAVENOUS | Status: DC
  Administered 2017-09-12 – 2017-09-14 (×2): via INTRAVENOUS

## 2017-09-12 MED ORDER — IOPAMIDOL (ISOVUE-300) INJECTION 61%
100.0000 mL | Freq: Once | INTRAVENOUS | Status: AC | PRN
  Administered 2017-09-12: 100 mL via INTRAVENOUS

## 2017-09-12 MED ORDER — ONDANSETRON HCL 4 MG/2ML IJ SOLN
4.0000 mg | Freq: Once | INTRAMUSCULAR | Status: AC
  Administered 2017-09-12: 4 mg via INTRAVENOUS
  Filled 2017-09-12: qty 2

## 2017-09-12 MED ORDER — SODIUM CHLORIDE 0.9 % IV SOLN
INTRAVENOUS | Status: DC | PRN
  Administered 2017-09-12: 1000 mL via INTRAVENOUS

## 2017-09-12 MED ORDER — VANCOMYCIN HCL 10 G IV SOLR: 1250.0000 mg | INTRAVENOUS | Status: DC

## 2017-09-12 NOTE — Telephone Encounter (Signed)
Called in c/o having watery diarrhea for 2 days.   She has also been incontinent of stool at least 3 times.   She lives alone.  She is c/o being so weak and tired.   She has not eaten for 3 days.   She is drinking one bottle of the low sugar Gator Aid a day because she has diabetes but does not use insulin.     She said her urine is very dark.   She is also c/o having pain in the middle of her back since this morning.    She lives a lone and does not have anyone who can help her.   "My neighbor will help me but she charges me for everything she does".    "She charges me $10 just to go to the store for me".    "I'm on a fixed income".   "I can't afford that".   I instructed her to call the ambulance.   They can take you to the hospital.   She sounds very dehydrated.   She agreed to call the ambulance and go to [facility].  I routed a note to Dr. Alain Marion making him aware of her situation.  Reason for Disposition . [1] Drinking very little AND [2] dehydration suspected (e.g., no urine > 12 hours, very dry mouth, very lightheaded)  Answer Assessment - Initial Assessment Questions 1. DIARRHEA SEVERITY: "How bad is the diarrhea?" "How many extra stools have you had in the past 24 hours than normal?"    - NO DIARRHEA (SCALE 0)   - MILD (SCALE 1-3): Few loose or mushy BMs; increase of 1-3 stools over normal daily number of stools; mild increase in ostomy output.   -  MODERATE (SCALE 4-7): Increase of 4-6 stools daily over normal; moderate increase in ostomy output. * SEVERE (SCALE 8-10; OR 'WORST POSSIBLE'): Increase of 7 or more stools daily over normal; moderate increase in ostomy output; incontinence.     I've been 3 times.   Twice I did not make it to the toilet.  Once I made it to the toilet.   2. ONSET: "When did the diarrhea begin?"      2 days.   3. BM CONSISTENCY: "How loose or watery is the diarrhea?"      Watery 4. VOMITING: "Are you also vomiting?" If so, ask: "How many times in  the past 24 hours?"      Just diarrhea 5. ABDOMINAL PAIN: "Are you having any abdominal pain?" If yes: "What does it feel like?" (e.g., crampy, dull, intermittent, constant)      This morning I had a pain in my back when I woke up.   In meddle of my back.   When I rolled over I messed on myself with diarrhea.   I live alone. 6. ABDOMINAL PAIN SEVERITY: If present, ask: "How bad is the pain?"  (e.g., Scale 1-10; mild, moderate, or severe)   - MILD (1-3): doesn't interfere with normal activities, abdomen soft and not tender to touch    - MODERATE (4-7): interferes with normal activities or awakens from sleep, tender to touch    - SEVERE (8-10): excruciating pain, doubled over, unable to do any normal activities       Moderate 7. ORAL INTAKE: If vomiting, "Have you been able to drink liquids?" "How much fluids have you had in the past 24 hours?"     I'm drinking one bottle of Gator Aid a day.  8. HYDRATION: "Any signs of dehydration?" (e.g., dry mouth [not just dry lips], too weak to stand, dizziness, new weight loss) "When did you last urinate?"     I feel lightheaded when I'm up.    9. EXPOSURE: "Have you traveled to a foreign country recently?" "Have you been exposed to anyone with diarrhea?" "Could you have eaten any food that was spoiled?"     3 days ago I ate some snack crackers.   That's the last thing I ate.   10. ANTIBIOTIC USE: "Are you taking antibiotics now or have you taken antibiotics in the past 2 months?"       She was on an antibiotics on Aug. 15 but not now.  She had a reaction to it. 11. OTHER SYMPTOMS: "Do you have any other symptoms?" (e.g., fever, blood in stool)       Pain in my back.   12. PREGNANCY: "Is there any chance you are pregnant?" "When was your last menstrual period?"       N/A  Protocols used: DIARRHEA-A-AH

## 2017-09-12 NOTE — Telephone Encounter (Signed)
I called her back at 12:57 to make sure she had called 911 ok.     She was packing up her Humara.   "It has to be refrigerated".    She asked if she should take it with her but if there was a possibility she could not get it today it needed to be kept refrigerated.    I instructed her to leave it at home in her refrigerator.   That way it would keep in case she was not able to take it at the hospital.    She agreed with this. She is going to call 911 after she gets her Humara into the refrigerator.  She assured me she was going to call 911.

## 2017-09-12 NOTE — Progress Notes (Signed)
Pharmacy Antibiotic Note  [name] is a 57 y.o. female admitted on 09/12/2017 with cholecystitis.  Pharmacy has been consulted for Zosyn dosing.  Plan: Zosyn 3.375g IV q8h (4 hour infusion).  Monitor clinical course, renal function, cultures as available   Dosage remains stable at above dosage and need for further dosage adjustment appears unlikely at present.    Will sign off at this time.  Please reconsult if a change in clinical status warrants re-evaluation of dosage.        Temp (24hrs), Avg:99.5 F (37.5 C), Min:98.4 F (36.9 C), Max:100.5 F (38.1 C)  Recent Labs  Lab 09/12/17 1510 09/12/17 1524 09/12/17 1814  WBC 21.9*  --   --   CREATININE 1.19*  --   --   LATICACIDVEN  --  1.05 0.68    CrCl cannot be calculated (Unknown ideal weight.).    Allergies  Allergen Reactions  . Belsomra [Suvorexant] Other (See Comments)    [place] out of bed while asleep: "I'm waking up as I'm falling on the floor;" "Night terrors"  . Enalapril Maleate Cough  . Latex Hives  . Nickel Other (See Comments)    Blisters  Pt has a titanium right and left knee - nickel causes skin irritations that form into blisters and sores  . Other Other (See Comments)    Patient has Sarcoidosis and can't tolerate any metals  . Iodine Other (See Comments)    Patient received IV contrast without pre-meds without any problems 09/12/17 kwl Mother was intolerant--CODED on CT table---pt never tried  . Doxycycline Diarrhea and Nausea And Vomiting  . Hydrochlorothiazide Other (See Comments)    Low potassium levels   . Hydroxychloroquine Sulfate Other (See Comments)    Vision changes   . Lyrica [Pregabalin] Other (See Comments)    "Made me feel high"  . Tape Other (See Comments)    Medical tape causes bruising    Antimicrobials this admission: 8/30 Zosyn >>    Dose adjustments this admission: ---  Microbiology results: 8/30 BCx:  8/30 HIV antibody:  Thank you for allowing pharmacy  to be a part of this patient's care.   Royetta Asal, PharmD, BCPS Pager [phone] 09/12/2017 7:39 PM

## 2017-09-12 NOTE — Progress Notes (Signed)
A consult was received from an ED physician for zosyn per pharmacy dosing.  The patient's profile has been reviewed for ht/wt/allergies/indication/available labs.   A one time order has been placed for zosyn.    Further antibiotics/pharmacy consults should be ordered by admitting physician if indicated.                       Thank you, Dolly Rias RPh 09/12/2017, 5:40 PM Pager [phone]

## 2017-09-12 NOTE — ED Triage Notes (Signed)
EMS reports, called out for weakness, nausea, vomiting, diarrhea on and off x 2 weeks. Hypotensive, Hx of diabetes,  BP 85/52 HR 75 RR 16 Sp02 97 on 2ltrs 24/7 CBG 134  20 LAC  542ml NS enroute

## 2017-09-12 NOTE — Telephone Encounter (Signed)
Will forward to MD for FYI.Marland KitchenJohny Garrison

## 2017-09-12 NOTE — Progress Notes (Signed)
Pharmacy Antibiotic Note  [name] is a 57 y.o. female admitted on 09/12/2017 with cholecystitis.  Pharmacy has been consulted for vancomycin for sepsis.  Plan: Vancomycin 2gm IV x 1 then 1250mg  q24h (AUC 486.6, Scr 1.19) Continue Zosyn 3.375g IV Q8H infused over 4hrs. Daily Scr Follow cultures, renal function and clinical course  Height: 5\' 7"  (170.2 cm) Weight: 264 lb 8.8 oz (120 kg) IBW/kg (Calculated) : 61.6  Temp (24hrs), Avg:99.1 F (37.3 C), Min:98.3 F (36.8 C), Max:100.5 F (38.1 C)  Recent Labs  Lab 09/12/17 1510 09/12/17 1524 09/12/17 1814  WBC 21.9*  --   --   CREATININE 1.19*  --   --   LATICACIDVEN  --  1.05 0.68    Estimated Creatinine Clearance: 70 mL/min (A) (by C-G formula based on SCr of 1.19 mg/dL (H)).    Allergies  Allergen Reactions  . Belsomra [Suvorexant] Other (See Comments)    [place] out of bed while asleep: "I'm waking up as I'm falling on the floor;" "Night terrors"  . Enalapril Maleate Cough  . Latex Hives  . Nickel Other (See Comments)    Blisters  Pt has a titanium right and left knee - nickel causes skin irritations that form into blisters and sores  . Other Other (See Comments)    Patient has Sarcoidosis and can't tolerate any metals  . Iodine Other (See Comments)    Patient received IV contrast without pre-meds without any problems 09/12/17 kwl Mother was intolerant--CODED on CT table---pt never tried  . Doxycycline Diarrhea and Nausea And Vomiting  . Hydrochlorothiazide Other (See Comments)    Low potassium levels   . Hydroxychloroquine Sulfate Other (See Comments)    Vision changes   . Lyrica [Pregabalin] Other (See Comments)    "Made me feel high"  . Tape Other (See Comments)    Medical tape causes bruising    Antimicrobials this admission: 8/30 zosyn >> 8/30 vanc >> Dose adjustments this admission:    Thank you for allowing pharmacy to be a part of this patient's care.  Dolly Rias RPh 09/12/2017, 9:29  PM Pager [phone]

## 2017-09-12 NOTE — ED Notes (Signed)
ED TO INPATIENT HANDOFF REPORT  Name/Age/Gender [name] 57 y.o. female  Code Status Code Status History    Date Active Date Inactive Code Status Order ID Comments User Context   05/23/2017 2025 05/26/2017 1616 Full Code [id number]  Rise Patience, MD ED   07/21/2016 1011 07/23/2016 1811 Full Code [id number]  Lavina Hamman, MD Inpatient   08/17/2015 2029 08/19/2015 1827 Full Code [id number]  Juanito Doom, MD Inpatient   08/17/2015 1807 08/17/2015 2029 Full Code [id number]  Raylene Miyamoto, MD ED   07/13/2015 1747 07/16/2015 1631 Full Code [id number]  Meredith Pel, MD Inpatient   11/15/2014 1815 11/18/2014 1755 Full Code [id number]  Meredith Pel, MD Inpatient      Home/SNF/Other Home  Chief Complaint weakness;hypotension  Level of Care/Admitting Diagnosis ED Disposition    ED Disposition Condition [place]: [facility] [[id number]]  Level of Care: Med-Surg [16]  Diagnosis: Cholecystitis [993716]  Admitting Physician: Mercy Riding [[id number]]  Attending Physician: Mercy Riding [[id number]]  Estimated length of stay: past midnight tomorrow  Certification:: I certify this patient will need inpatient services for at least 2 midnights  PT Class (Do Not Modify): Inpatient [101]  PT Acc Code (Do Not Modify): Private [1]       Medical History Past Medical History:  Diagnosis Date  . Allergic rhinitis   . ALLERGIC RHINITIS 10/26/2009  . Anxiety   . ANXIETY 08/14/2006  . B12 DEFICIENCY 08/25/2007  . Complication of anesthesia    pt has had difficulty following anesthesia with her knee in 2016-unable to care for herself afterward  . Confusion   . Depression    takes Lexapro daily  . Diabetes mellitus without complication ([place])    was on insulin but has been off since Nov 2015 and now only takes Metformin daily  . DYSPNEA 04/28/2009   with exertion  . Esophageal reflux    takes Nexium daily  . Fibromyalgia   .  Headache    last migraine 2-73yr ago;takes Topamax daily  . History of shingles   . Hypertension    takes Coreg daily  . Insomnia    takes Nortriptyline nightly   . Joint pain   . Joint swelling   . Left knee pain   . Lichen planus   . Long-term memory loss   . Nocturia   . OSA (obstructive sleep apnea)    doesn't use CPAP;sleep study in epic from 2006  . Osteoarthritis   . Osteoarthritis   . Pneumonia    over 30 yrs ago  . Protein calorie malnutrition ([place]   . Rheumatoid arthritis ([place]   . Sarcoidosis    Dr. ZLolita Patella . Short-term memory loss   . Shortness of breath   . TIA (transient ischemic attack)   . Unsteady gait   . Urinary urgency   . Vitamin D deficiency    is supposed to take Vit D but can't afford it  . VITAMIN D DEFICIENCY 08/25/2007    Allergies Allergies  Allergen Reactions  . Belsomra [Suvorexant] Other (See Comments)    F[place] Circleout of bed while asleep: "I'm waking up as I'm falling on the floor;" "Night terrors"  . Enalapril Maleate Cough  . Latex Hives  . Nickel Other (See Comments)    Blisters  Pt has a titanium right and left knee - nickel causes skin irritations that form into blisters and sores  . Other  Other (See Comments)    Patient has Sarcoidosis and can't tolerate any metals  . Iodine Other (See Comments)    Patient received IV contrast without pre-meds without any problems 09/12/17 kwl Mother was intolerant--CODED on CT table---pt never tried  . Doxycycline Diarrhea and Nausea And Vomiting  . Hydrochlorothiazide Other (See Comments)    Low potassium levels   . Hydroxychloroquine Sulfate Other (See Comments)    Vision changes   . Lyrica [Pregabalin] Other (See Comments)    "Made me feel high"  . Tape Other (See Comments)    Medical tape causes bruising    IV Location/Drains/Wounds Patient Lines/Drains/Airways Status   Active Line/Drains/Airways    Name:   Placement date:   Placement time:   Site:   Days:   Peripheral IV 09/12/17  Left Antecubital   09/12/17    1435    Antecubital   less than 1   Ureteral Drain/Stent    06/26/17    1044    -   78   Incision (Closed) 06/26/17 Eye Right   06/26/17    1028     78          Labs/Imaging Results for orders placed or performed during the hospital encounter of 09/12/17 (from the past 48 hour(s))  CBC with Differential     Status: Abnormal   Collection Time: 09/12/17  3:10 PM  Result Value Ref Range   WBC 21.9 (H) 4.0 - 10.5 K/uL   RBC 3.38 (L) 3.87 - 5.11 MIL/uL   Hemoglobin 9.6 (L) 12.0 - 15.0 g/dL   HCT 30.7 (L) 36.0 - 46.0 %   MCV 90.8 78.0 - 100.0 fL   MCH 28.4 26.0 - 34.0 pg   MCHC 31.3 30.0 - 36.0 g/dL   RDW 15.5 11.5 - 15.5 %   Platelets 349 150 - 400 K/uL   Neutrophils Relative % 86 %   Neutro Abs 19.0 (H) 1.7 - 7.7 K/uL   Lymphocytes Relative 9 %   Lymphs Abs 1.9 0.7 - 4.0 K/uL   Monocytes Relative 5 %   Monocytes Absolute 1.0 0.1 - 1.0 K/uL   Eosinophils Relative 0 %   Eosinophils Absolute 0.1 0.0 - 0.7 K/uL   Basophils Relative 0 %   Basophils Absolute 0.0 0.0 - 0.1 K/uL    Comment: Performed at [facility], [place] [address] [place], [place] [address]  Comprehensive metabolic panel     Status: Abnormal   Collection Time: 09/12/17  3:10 PM  Result Value Ref Range   Sodium 138 135 - 145 mmol/L   Potassium 3.8 3.5 - 5.1 mmol/L   Chloride 105 98 - 111 mmol/L   CO2 24 22 - 32 mmol/L   Glucose, Bld 129 (H) 70 - 99 mg/dL   BUN 11 6 - 20 mg/dL   Creatinine, Ser 1.19 (H) 0.44 - 1.00 mg/dL   Calcium 8.1 (L) 8.9 - 10.3 mg/dL   Total Protein 7.1 6.5 - 8.1 g/dL   Albumin 2.3 (L) 3.5 - 5.0 g/dL   AST 148 (H) 15 - 41 U/L   ALT 94 (H) 0 - 44 U/L   Alkaline Phosphatase 217 (H) 38 - 126 U/L   Total Bilirubin 3.3 (H) 0.3 - 1.2 mg/dL   GFR calc non Af Amer 50 (L) >60 mL/min   GFR calc Af Amer 58 (L) >60 mL/min    Comment: (NOTE) The eGFR has been calculated using the CKD EPI equation. This  calculation has not been validated in all  clinical situations. eGFR's persistently <60 mL/min signify possible Chronic Kidney Disease.    Anion gap 9 5 - 15    Comment: Performed at [facility], [place] [address], [place], [place] [address]  Lipase, blood     Status: None   Collection Time: 09/12/17  3:10 PM  Result Value Ref Range   Lipase 34 11 - 51 U/L    Comment: Performed at [facility], [place] [address], [place], [place] [address]  I-Stat CG4 Lactic Acid, ED     Status: None   Collection Time: 09/12/17  3:24 PM  Result Value Ref Range   Lactic Acid, Venous 1.05 0.5 - 1.9 mmol/L  Urinalysis, Routine w reflex microscopic     Status: Abnormal   Collection Time: 09/12/17  6:07 PM  Result Value Ref Range   Color, Urine AMBER (A) YELLOW    Comment: BIOCHEMICALS MAY BE AFFECTED BY COLOR   APPearance CLEAR CLEAR   Specific Gravity, Urine 1.044 (H) 1.005 - 1.030   pH 6.0 5.0 - 8.0   Glucose, UA NEGATIVE NEGATIVE mg/dL   Hgb urine dipstick MODERATE (A) NEGATIVE   Bilirubin Urine SMALL (A) NEGATIVE   Ketones, ur NEGATIVE NEGATIVE mg/dL   Protein, ur NEGATIVE NEGATIVE mg/dL   Nitrite NEGATIVE NEGATIVE   Leukocytes, UA NEGATIVE NEGATIVE   RBC / HPF 6-10 0 - 5 RBC/hpf   WBC, UA 0-5 0 - 5 WBC/hpf   Bacteria, UA NONE SEEN NONE SEEN   Squamous Epithelial / LPF 0-5 0 - 5    Comment: Performed at [facility], [place] [address], [place], [place] [address]  I-Stat CG4 Lactic Acid, ED     Status: None   Collection Time: 09/12/17  6:14 PM  Result Value Ref Range   Lactic Acid, Venous 0.68 0.5 - 1.9 mmol/L  Brain natriuretic peptide     Status: Abnormal   Collection Time: 09/12/17  7:00 PM  Result Value Ref Range   B Natriuretic Peptide 204.1 (H) 0.0 - 100.0 pg/mL    Comment: Performed at [facility], [place] [address], [place], [place] [address]   Dg Chest 2 View  Result Date: 09/12/2017 CLINICAL DATA:  Productive cough and chills EXAM: CHEST - 2 VIEW  COMPARISON:  06/02/2017 FINDINGS: Cardiac enlargement with mild vascular congestion. Negative for edema or effusion. Negative for pneumonia IMPRESSION: Cardiac enlargement and mild vascular congestion. Negative for pneumonia or edema. Electronically Signed   By: Franchot Gallo M.D.   On: 09/12/2017 18:01   Ct Abdomen Pelvis W Contrast  Result Date: 09/12/2017 CLINICAL DATA:  Acute generalized abdominal pain x2 weeks with diarrhea and weight loss. EXAM: CT ABDOMEN AND PELVIS WITH CONTRAST TECHNIQUE: Multidetector CT imaging of the abdomen and pelvis was performed using the standard protocol following bolus administration of intravenous contrast. CONTRAST:  154m ISOVUE-300 IOPAMIDOL (ISOVUE-300) INJECTION 61% COMPARISON:  None. FINDINGS: Lower chest: Heart size is normal. Minimal atelectatic subpleural nodular density in the left lower lobe on axial series 6/11 most likely represents a small focus of atelectasis given its more band like appearance on coronal reformats. No effusion or pulmonary consolidation. Hepatobiliary: Distended gallbladder with transmural thickening and pericholecystic fluid consistent with acute cholecystitis is identified. Complicating this finding are areas of low attenuation involving the adjacent liver and gallbladder fossa with mild peripheral enhancement concerning for adjacent multifocal intrahepatic abscesses. A gallstone is identified. The common duct is  normal. Otherwise, no enhancing mass of the liver is seen. Pancreas: No pancreatic ductal dilatation, enhancing mass or inflammation. Spleen: Normal size spleen. Adrenals/Urinary Tract: Normal bilateral adrenal glands. Symmetric enhancement of the kidneys with small cyst arising off the interpolar right kidney measuring 13 mm. No hydroureteronephrosis. The urinary bladder is unremarkable. Stomach/Bowel: Small hiatal hernia. Decompressed stomach with normal small bowel rotation. No small-bowel obstruction. The distal and terminal  ileum are normal. Patient is status post appendectomy. Sympathetic thickening of the colon traversing the right upper quadrant and mid upper abdomen is identified secondary to the inflamed gallbladder. Otherwise, there is scattered colonic diverticulosis along the descending colon with greater degree of sigmoid diverticulosis and circular muscle hypertrophy identified. Vascular/Lymphatic: Aortoiliac atherosclerosis. No pathologically enlarged lymph nodes. Small subcentimeter short axis periportal lymph nodes likely reactive in etiology are noted. Reproductive: The uterus and adnexa are unremarkable. Other: No free air free fluid. Musculoskeletal: No acute nor aggressive osseous abnormality. Degenerative changes are present along the dorsal spine. IMPRESSION: Acute cholecystitis with markedly distended gallbladder, transmural thickening and pericholecystic inflammation identified. Complicating this finding are adjacent intrahepatic and pericholecystic fluid attenuating hypodensities with slight peripheral enhancement compatible with adjacent abscesses some which appear to be intrahepatic subjacent to the gallbladder fossa. 13 mm cyst arising from the interpolar right without worrisome features. Colonic diverticulosis without acute diverticulitis. Electronically Signed   By: Ashley Royalty M.D.   On: 09/12/2017 17:23    Pending Labs Unresulted Labs (From admission, onward)    Start     Ordered   09/12/17 1736  Blood Culture (routine x 2)  BLOOD CULTURE X 2,   STAT     09/12/17 1735   Signed and Held  HIV antibody (Routine Testing)  Once,   R     Signed and Held   Signed and Held  Comprehensive metabolic panel  Tomorrow morning,   R     Signed and Held   Signed and Held  CBC  Tomorrow morning,   R     Signed and Held          Vitals/Pain Today's Vitals   09/12/17 1930 09/12/17 1945 09/12/17 2000 09/12/17 2008  BP: (!) 80/66 (!) 86/51 (!) 90/43   Pulse: 76 73 75   Resp: 17 15 19    Temp:       TempSrc:      SpO2: 93% 93% 90%   PainSc:    9     Isolation Precautions No active isolations  Medications Medications  iopamidol (ISOVUE-300) 61 % injection (has no administration in time range)  HYDROmorphone (DILAUDID) injection 1 mg (has no administration in time range)  piperacillin-tazobactam (ZOSYN) IVPB 3.375 g (has no administration in time range)  sodium chloride 0.9 % bolus 1,000 mL (0 mLs Intravenous Stopped 09/12/17 1721)  iopamidol (ISOVUE-300) 61 % injection 100 mL (100 mLs Intravenous Contrast Given 09/12/17 1637)  HYDROmorphone (DILAUDID) injection 1 mg (1 mg Intravenous Given 09/12/17 1717)  ondansetron (ZOFRAN) injection 4 mg (4 mg Intravenous Given 09/12/17 1717)  acetaminophen (TYLENOL) tablet 1,000 mg (1,000 mg Oral Given 09/12/17 1809)  piperacillin-tazobactam (ZOSYN) IVPB 3.375 g (0 g Intravenous Stopped 09/12/17 1841)    Mobility walks with person assist

## 2017-09-12 NOTE — ED Provider Notes (Signed)
[facility] DEPT Provider Note   CSN: [id number] Arrival date & time: 09/12/17  1423     History   Chief Complaint Chief Complaint  Patient presents with  . Weakness  . Nausea  . Emesis  . Diarrhea  . Abdominal Pain    HPI [name] is a 57 y.o. female with a history of diabetes mellitus, chronic respiratory failure with hypoxia s/p 2L home O2, sarcoidosis on Humira, PE, DVT on Xarelto who presents to the emergency department with a chief complaint of abdominal pain.  The patient endorses constant, worsening right upper quadrant abdominal pain that began 2 weeks ago.  Pain is worse after eating and alleviated by nothing.  She reports that she has had chills since yesterday and nonbloody diarrhea for the last 2 days.  She also reports intermittent nausea and emesis that is worse after eating for the last 2 weeks.  She she also endorses a productive cough with thick green sputum for the last few weeks.  She reports that she was started on a course of Augmentin on 8/14 for sinusitis.  Her medical record indicates that she was also taking prednisone prescribed by ENT.    She reports that she contacted her dermatologist because she has been out of Humira and has missed her weekly doses for the last 3 weeks.  She reports that her prescription came today but she has not yet started the medication.   The history is provided by the patient. No language interpreter was used.    Past Medical History:  Diagnosis Date  . Allergic rhinitis   . ALLERGIC RHINITIS 10/26/2009  . Anxiety   . ANXIETY 08/14/2006  . B12 DEFICIENCY 08/25/2007  . Complication of anesthesia    pt has had difficulty following anesthesia with her knee in 2016-unable to care for herself afterward  . Confusion   . Depression    takes Lexapro daily  . Diabetes mellitus without complication ([place])    was on insulin but has been off since Nov 2015 and now only takes Metformin  daily  . DYSPNEA 04/28/2009   with exertion  . Esophageal reflux    takes Nexium daily  . Fibromyalgia   . Headache    last migraine 2-13yr ago;takes Topamax daily  . History of shingles   . Hypertension    takes Coreg daily  . Insomnia    takes Nortriptyline nightly   . Joint pain   . Joint swelling   . Left knee pain   . Lichen planus   . Long-term memory loss   . Nocturia   . OSA (obstructive sleep apnea)    doesn't use CPAP;sleep study in epic from 2006  . Osteoarthritis   . Osteoarthritis   . Pneumonia    over 30 yrs ago  . Protein calorie malnutrition ([place]   . Rheumatoid arthritis ([place]   . Sarcoidosis    Dr. ZLolita Patella . Short-term memory loss   . Shortness of breath   . TIA (transient ischemic attack)   . Unsteady gait   . Urinary urgency   . Vitamin D deficiency    is supposed to take Vit D but can't afford it  . VITAMIN D DEFICIENCY 08/25/2007    Patient Active Problem List   Diagnosis Date Noted  . Cholecystitis 09/12/2017  . Sepsis ([place] 09/12/2017  . Cerumen impaction 08/27/2017  . Dacrocystitis, right 06/02/2017  . Orbital cellulitis on right 05/23/2017  . Preseptal cellulitis  05/23/2017  . Cellulitis 05/23/2017  . Left arm pain 03/10/2017  . COPD (chronic obstructive pulmonary disease) ([place]) 12/30/2016  . Syncope 12/17/2016  . Low back pain 12/17/2016  . Diastolic dysfunction 67/54/4920  . OSA (obstructive sleep apnea) 09/30/2016  . Chronic respiratory failure with hypoxia ([place]) 08/23/2016  . Pulmonary embolism ([place]) 07/21/2016  . DVT (deep venous thrombosis) ([place]) 07/21/2016  . Cough 07/11/2016  . Balance problems 06/14/2016  . Pain of right scapula 06/07/2016  . Acute bronchitis 01/30/2016  . Weight gain 12/20/2015  . Acute pulmonary embolism ([place]) 08/17/2015  . Controlled type 2 diabetes mellitus with chronic kidney disease, without long-term current use of insulin ([place]) 02/01/2015  . Postoperative anemia due to acute blood loss  02/01/2015  . Arthritis of knee, degenerative 11/15/2014  . Calf pain 01/21/2014  . Insomnia 12/23/2013  . Leg ulcer, left ([place]) 09/13/2013  . Abnormal SPEP 06/04/2013  . Memory loss 04/27/2013  . Panic attacks 03/17/2013  . Right knee pain 12/29/2012  . Rash and nonspecific skin eruption 10/27/2012  . Dizziness and giddiness 09/07/2012  . Pulmonary nodules 07/20/2012  . CAD in native artery 05/28/2012  . Seborrheic dermatitis 01/20/2012  . Recurrent knee pain 09/03/2011  . Knee pain, left 08/03/2011  . Leg pain, left 07/17/2011  . Polydipsia 05/09/2011  . Abscess of axilla, left 08/10/2010  . Obesity 03/07/2010  . INSOMNIA, CHRONIC 03/07/2010  . NAUSEA 03/07/2010  . Avulsion fracture of tooth 03/07/2010  . Allergic rhinitis 10/26/2009  . GRIEF REACTION 08/01/2009  . DYSPNEA 04/28/2009  . MAXILLARY SINUSITIS 10/05/2008  . Essential hypertension 08/27/2007  . B12 deficiency 08/25/2007  . Vitamin D deficiency 08/25/2007  . ELEVATED BP 07/10/2007  . VISUAL CHANGES 07/02/2007  . UTI 02/07/2007  . ABDOMINAL PAIN RIGHT LOWER QUADRANT 02/07/2007  . SINUSITIS, ACUTE 02/02/2007  . Osteoarthritis 02/02/2007  . Sarcoidosis 08/14/2006  . Anxiety state 08/14/2006  . Major depressive disorder, recurrent episode ([place]) 08/14/2006  . GERD 08/14/2006    Past Surgical History:  Procedure Laterality Date  . APPENDECTOMY    . arthroscopic knee surgery Right 11-12-04  . AXILLARY ABCESS IRRIGATION AND DEBRIDEMENT  Jul & Aug2012  . CARPAL TUNNEL RELEASE Left 05/23/2014   Procedure: CARPAL TUNNEL RELEASE;  Surgeon: Meredith Pel, MD;  Location: [facility];  Service: Orthopedics;  Laterality: Left;  . cyst removed from top of buttocks  at age 26  . ENDOMETRIAL ABLATION    . LACRIMAL DUCT EXPLORATION Right 06/26/2017   Procedure: LACRIMAL DUCT EXPLORATION AND ETHMOIDECTOMY;  Surgeon: Clista Bernhardt, MD;  Location: [facility];  Service: Ophthalmology;  Laterality: Right;  . TEAR DUCT PROBING  Right 06/26/2017   Procedure: TEAR DUCT PROBING WITH STENT;  Surgeon: Clista Bernhardt, MD;  Location: [facility];  Service: Ophthalmology;  Laterality: Right;  . TOTAL KNEE ARTHROPLASTY Right 11/15/2014   Procedure: TOTAL RIGHT KNEE ARTHROPLASTY;  Surgeon: Meredith Pel, MD;  Location: [facility];  Service: Orthopedics;  Laterality: Right;  . TOTAL KNEE ARTHROPLASTY Left 07/13/2015   Procedure: LEFT TOTAL KNEE ARTHROPLASTY;  Surgeon: Meredith Pel, MD;  Location: [facility];  Service: Orthopedics;  Laterality: Left;     OB History   None      Home Medications    Prior to Admission medications   Medication Sig Start Date End Date Taking? Authorizing Provider  Adalimumab (HUMIRA) 40 MG/0.8ML PSKT Inject 40 mg into the skin every 7 (seven) days.    Yes [provider]  budesonide-formoterol (SYMBICORT) 160-4.5 MCG/ACT inhaler Inhale 2 puffs into the lungs TWICE DAILY 120/4=30 06/10/17  Yes Rigoberto Noel, MD  carvedilol (COREG) 12.5 MG tablet Take 1 tablet (12.5 mg total) by mouth 2 (two) times daily with a meal. 06/12/17  Yes Plotnikov, Evie Lacks, MD  gabapentin (NEURONTIN) 300 MG capsule Take 1 capsule (300 mg total) by mouth 2 (two) times daily as needed (for pain). 06/12/17  Yes Plotnikov, Evie Lacks, MD  potassium chloride (K-DUR,KLOR-CON) 10 MEQ tablet Take 10 mEq by mouth daily.  09/08/17  Yes [provider]  XARELTO 20 MG TABS tablet Take 1 tablet (20 mg total) by mouth at bedtime. 06/12/17  Yes Plotnikov, Evie Lacks, MD  clonazePAM (KLONOPIN) 0.5 MG tablet Take 1-2 tablets (0.5-1 mg total) by mouth at bedtime. 08/04/17   Plotnikov, Evie Lacks, MD  esomeprazole (NEXIUM) 40 MG capsule Take 1 capsule (40 mg total) by mouth daily before breakfast. 08/07/17   Plotnikov, Evie Lacks, MD  furosemide (LASIX) 40 MG tablet TAKE 1 TABLET BY MOUTH EVERY MORNING FOR EDEMA Patient taking differently: Take 20 mg by mouth daily as needed for fluid or edema.  01/10/17   Plotnikov, Evie Lacks, MD    glucose blood (EZ SMART PLUS GLUCOSE TEST) test strip Use as instructed to test sugars 4 times daily DX: E11.8 09/11/17   Philemon Kingdom, MD  JANUVIA 50 MG tablet Take 1 tablet (50 mg total) by mouth daily. Patient taking differently: Take 50 mg by mouth daily.  09/10/17   Philemon Kingdom, MD  Lancets (ACCU-CHEK MULTICLIX) lancets Use as instructed to check sugars 4 times daily DX: E11.8 06/30/17   Philemon Kingdom, MD  metFORMIN (GLUCOPHAGE-XR) 500 MG 24 hr tablet TAKE 2 TABLETS(1000 MG) BY MOUTH DAILY WITH BREAKFAST 06/12/17   Plotnikov, Evie Lacks, MD  promethazine (PHENERGAN) 25 MG tablet Take 1 tablet (25 mg total) by mouth every 8 (eight) hours as needed for nausea or vomiting. 09/03/17   Plotnikov, Evie Lacks, MD  topiramate (TOPAMAX) 50 MG tablet Take 1 tablet (50 mg total) by mouth 2 (two) times daily. 06/12/17   Plotnikov, Evie Lacks, MD  venlafaxine XR (EFFEXOR-XR) 75 MG 24 hr capsule Take 1 capsule (75 mg total) by mouth daily with breakfast. 03/04/17   Plotnikov, Evie Lacks, MD  VENTOLIN HFA 108 (90 Base) MCG/ACT inhaler Inhale 2 puffs into the lungs every 6 (six) hours as needed for wheezing or shortness of breath. 200/8=25 Patient taking differently: Inhale 2 puffs into the lungs every 6 (six) hours as needed for wheezing or shortness of breath.  12/11/16   Plotnikov, Evie Lacks, MD  VOLTAREN 1 % GEL Apply 2 g topically 2 (two) times daily as needed (for pain). 100/4=25 Patient taking differently: Apply 4 g topically 4 (four) times daily as needed (pain).  04/29/17   Plotnikov, Evie Lacks, MD    Family History Family History  Problem Relation Age of Onset  . Heart disease Mother   . Kidney disease Mother   . Cancer Father        leukemia  . Hypertension Unknown   . Coronary artery disease Unknown        female 1st degree relative <60  . Heart failure Unknown        congestive  . Coronary artery disease Unknown        Female 1st degree relative <50  . Breast cancer Unknown         1st degree relative <50 S  .  Breast cancer Sister     Social History Social History   Tobacco Use  . Smoking status: Former Smoker    Packs/day: 0.50    Years: 10.00    Pack years: 5.00    Types: Cigarettes  . Smokeless tobacco: Never Used  . Tobacco comment: quit smoking in 2004  Substance Use Topics  . Alcohol use: No  . Drug use: No     Allergies   Belsomra [suvorexant]; Enalapril maleate; Latex; Nickel; Other; Iodine; Doxycycline; Hydrochlorothiazide; Hydroxychloroquine sulfate; Lyrica [pregabalin]; and Tape   Review of Systems Review of Systems  Constitutional: Positive for chills and fever. Negative for activity change.  HENT: Positive for congestion. Negative for sore throat.   Eyes: Negative for visual disturbance.  Respiratory: Positive for cough. Negative for shortness of breath.   Cardiovascular: Negative for chest pain.  Gastrointestinal: Positive for abdominal pain, diarrhea, nausea and vomiting. Negative for anal bleeding and constipation.  Genitourinary: Negative for dysuria and hematuria.  Musculoskeletal: Negative for back pain, neck pain and neck stiffness.  Skin: Negative for rash.  Allergic/Immunologic: Positive for immunocompromised state.  Neurological: Negative for dizziness and headaches.  Psychiatric/Behavioral: Negative for confusion.   Physical Exam Updated Vital Signs BP 111/87 (BP Location: Right Arm)   Pulse 70   Temp 98.3 F (36.8 C)   Resp 16   SpO2 97%   Physical Exam  Constitutional: No distress. Nasal cannula in place.  Chronically ill-appearing female.  Appears much older than stated age.  HENT:  Head: Normocephalic.  Eyes: Conjunctivae are normal.  Neck: Neck supple.  Cardiovascular: Normal rate and regular rhythm. Exam reveals no gallop and no friction rub.  No murmur heard. Pulmonary/Chest: Effort normal. No respiratory distress.  Abdominal: Soft. She exhibits no distension. There is tenderness in the right upper  quadrant. There is positive Murphy's sign.  Exquisitely tender to palpation in the right upper quadrant with guarding.  Positive Murphy sign.  She is also diffusely tender to palpation throughout the abdomen, but less so than the right upper quadrant.  Abdomen is soft, non-distended, but obese.   Neurological: She is alert.  Skin: Skin is warm. No rash noted.  Psychiatric: Her behavior is normal.  Nursing note and vitals reviewed.    ED Treatments / Results  Labs (all labs ordered are listed, but only abnormal results are displayed) Labs Reviewed  CBC WITH DIFFERENTIAL/PLATELET - Abnormal; Notable for the following components:      Result Value   WBC 21.9 (*)    RBC 3.38 (*)    Hemoglobin 9.6 (*)    HCT 30.7 (*)    Neutro Abs 19.0 (*)    All other components within normal limits  COMPREHENSIVE METABOLIC PANEL - Abnormal; Notable for the following components:   Glucose, Bld 129 (*)    Creatinine, Ser 1.19 (*)    Calcium 8.1 (*)    Albumin 2.3 (*)    AST 148 (*)    ALT 94 (*)    Alkaline Phosphatase 217 (*)    Total Bilirubin 3.3 (*)    GFR calc non Af Amer 50 (*)    GFR calc Af Amer 58 (*)    All other components within normal limits  URINALYSIS, ROUTINE W REFLEX MICROSCOPIC - Abnormal; Notable for the following components:   Color, Urine AMBER (*)    Specific Gravity, Urine 1.044 (*)    Hgb urine dipstick MODERATE (*)    Bilirubin Urine SMALL (*)    All  other components within normal limits  BRAIN NATRIURETIC PEPTIDE - Abnormal; Notable for the following components:   B Natriuretic Peptide 204.1 (*)    All other components within normal limits  CULTURE, BLOOD (ROUTINE X 2)  CULTURE, BLOOD (ROUTINE X 2)  LIPASE, BLOOD  HEPARIN LEVEL (UNFRACTIONATED)  APTT  I-STAT CG4 LACTIC ACID, ED  I-STAT CG4 LACTIC ACID, ED  I-STAT CG4 LACTIC ACID, ED    EKG None  Radiology Dg Chest 2 View  Result Date: 09/12/2017 CLINICAL DATA:  Productive cough and chills EXAM: CHEST - 2  VIEW COMPARISON:  06/02/2017 FINDINGS: Cardiac enlargement with mild vascular congestion. Negative for edema or effusion. Negative for pneumonia IMPRESSION: Cardiac enlargement and mild vascular congestion. Negative for pneumonia or edema. Electronically Signed   By: Franchot Gallo M.D.   On: 09/12/2017 18:01   Ct Abdomen Pelvis W Contrast  Result Date: 09/12/2017 CLINICAL DATA:  Acute generalized abdominal pain x2 weeks with diarrhea and weight loss. EXAM: CT ABDOMEN AND PELVIS WITH CONTRAST TECHNIQUE: Multidetector CT imaging of the abdomen and pelvis was performed using the standard protocol following bolus administration of intravenous contrast. CONTRAST:  148m ISOVUE-300 IOPAMIDOL (ISOVUE-300) INJECTION 61% COMPARISON:  None. FINDINGS: Lower chest: Heart size is normal. Minimal atelectatic subpleural nodular density in the left lower lobe on axial series 6/11 most likely represents a small focus of atelectasis given its more band like appearance on coronal reformats. No effusion or pulmonary consolidation. Hepatobiliary: Distended gallbladder with transmural thickening and pericholecystic fluid consistent with acute cholecystitis is identified. Complicating this finding are areas of low attenuation involving the adjacent liver and gallbladder fossa with mild peripheral enhancement concerning for adjacent multifocal intrahepatic abscesses. A gallstone is identified. The common duct is normal. Otherwise, no enhancing mass of the liver is seen. Pancreas: No pancreatic ductal dilatation, enhancing mass or inflammation. Spleen: Normal size spleen. Adrenals/Urinary Tract: Normal bilateral adrenal glands. Symmetric enhancement of the kidneys with small cyst arising off the interpolar right kidney measuring 13 mm. No hydroureteronephrosis. The urinary bladder is unremarkable. Stomach/Bowel: Small hiatal hernia. Decompressed stomach with normal small bowel rotation. No small-bowel obstruction. The distal and  terminal ileum are normal. Patient is status post appendectomy. Sympathetic thickening of the colon traversing the right upper quadrant and mid upper abdomen is identified secondary to the inflamed gallbladder. Otherwise, there is scattered colonic diverticulosis along the descending colon with greater degree of sigmoid diverticulosis and circular muscle hypertrophy identified. Vascular/Lymphatic: Aortoiliac atherosclerosis. No pathologically enlarged lymph nodes. Small subcentimeter short axis periportal lymph nodes likely reactive in etiology are noted. Reproductive: The uterus and adnexa are unremarkable. Other: No free air free fluid. Musculoskeletal: No acute nor aggressive osseous abnormality. Degenerative changes are present along the dorsal spine. IMPRESSION: Acute cholecystitis with markedly distended gallbladder, transmural thickening and pericholecystic inflammation identified. Complicating this finding are adjacent intrahepatic and pericholecystic fluid attenuating hypodensities with slight peripheral enhancement compatible with adjacent abscesses some which appear to be intrahepatic subjacent to the gallbladder fossa. 13 mm cyst arising from the interpolar right without worrisome features. Colonic diverticulosis without acute diverticulitis. Electronically Signed   By: DAshley RoyaltyM.D.   On: 09/12/2017 17:23    Procedures Procedures (including critical care time)  Medications Ordered in ED Medications  iopamidol (ISOVUE-300) 61 % injection (has no administration in time range)  HYDROmorphone (DILAUDID) injection 1 mg (1 mg Intravenous Given 09/12/17 2031)  piperacillin-tazobactam (ZOSYN) IVPB 3.375 g (has no administration in time range)  heparin ADULT infusion 100 units/mL (25000  units/240m sodium chloride 0.45%) (has no administration in time range)  sodium chloride 0.9 % bolus 500 mL (has no administration in time range)  sodium chloride 0.9 % bolus 1,000 mL (0 mLs Intravenous Stopped  09/12/17 1721)  iopamidol (ISOVUE-300) 61 % injection 100 mL (100 mLs Intravenous Contrast Given 09/12/17 1637)  HYDROmorphone (DILAUDID) injection 1 mg (1 mg Intravenous Given 09/12/17 1717)  ondansetron (ZOFRAN) injection 4 mg (4 mg Intravenous Given 09/12/17 1717)  acetaminophen (TYLENOL) tablet 1,000 mg (1,000 mg Oral Given 09/12/17 1809)  piperacillin-tazobactam (ZOSYN) IVPB 3.375 g (0 g Intravenous Stopped 09/12/17 1841)     Initial Impression / Assessment and Plan / ED Course  I have reviewed the triage vital signs and the nursing notes.  Pertinent labs & imaging results that were available during my care of the patient were reviewed by me and considered in my medical decision making (see chart for details).     57year old female with a history of diabetes mellitus, chronic respiratory failure with hypoxia s/p 2L home O2, sarcoidosis on Humira, PE, DVT on Xarelto presenting with right upper quadrant pain, fever, chills, nausea, vomiting, diarrhea, and productive cough.  Discussed with Dr. MDayna Barker attending physician.  Rectal temp 100.5.  Tylenol given.  No tachycardia. BP 110/69 or arrival. On exam, the patient has significant.  Automatic blood pressure cuff with soft blood pressures in the 841Cto 930Dsystolic, map in the low 631Y  Blood pressure cuff is noted to be on the patient's right forearm while the patient is reclined.  Manual blood pressure of 111/87.  She is actively coughing up large amounts of thick discolored mucous. No hypoxia or chest pain.   Given diarrhea and vomiting, 1 L IV fluid bolus was given.  She has no history of CHF.  Last echo with EF of 60 to 65%.  She is on her home 2 L by nasal cannula.  She denies dyspnea.  She does endorse a thick, productive cough.  Chest x-ray with mild pulmonary vascular congestion and cardiac enlargement.  No pneumonia.  Labs are notable for alk phos of 217, AST of 148, and ALT of 94.  Total bili is also elevated to 3.3.  Given acutely  elevated transaminases and positive Murphy sign, this is concerning for cholecystitis.  Labs are otherwise notable for leukocytosis of 22 and hemoglobin of 9.6.  She also has an elevated neutrophil count of 19.  This might be slightly elevated as the patient was also recently on prednisone. CT abdomen pelvis with acute cholecystitis complicated by intrahepatic abscesses.  Patient meets code sepsis criteria.  Zosyn initiated in the ED.  Consult to general surgery and spoke with Dr. MHassell Done  Patient's last dose of Xarelto was last night.  She has been off of her Humira for the last 3 weeks.  Given her history of a chronic respiratory failure with hypoxia, anticoagulation, along with other multiple comorbidities, the patient is a poor surgical candidate.  Dr. MHassell Donerecommends IV antibiotics and likely percutaneous drainage.  Surgery will follow the patient.  Spoke with GCyndia Skeeters hospitalist team, who will accept the patient for admission. The patient appears reasonably stabilized for admission considering the current resources, flow, and capabilities available in the ED at this time, and I doubt any other [facility]iring further screening and/or treatment in the ED prior to admission.  Final Clinical Impressions(s) / ED Diagnoses   Final diagnoses:  Acute suppurative cholecystitis  RUQ pain    ED Discharge Orders  None       Joanne Gavel, PA-C 09/12/17 2059    Merrily Pew, MD 09/13/17 0005

## 2017-09-12 NOTE — H&P (Signed)
History and Physical    [name] WJX:[id number] DOB: [birth year] DOA: 09/12/2017  PCP: Cassandria Anger, MD Patient coming from: home  Chief Complaint: Abdominal pain/nausea/vomiting/diarrhea  HPI: [name] is a 57 y.o. female with medical history significant of CAD, hypertension, diabetes, depression, DVT/PE on Xarelto, OSA, COPD on 2 L by nasal cannula and sarcoid on Humira who presented with 2 weeks of nausea, vomiting, diarrhea and abdominal pain.  She had interval improvement in his symptoms until 2 to 3 days ago.  Pain was over epigastric area with radiation to her back.  She describes the pain as sharp.  Pain is intermittent and comes and goes.  She rates the intensity as 9 out of 10 at present.  She says it gets to 10 out of 10 at its worse.  She also have nausea, vomiting and diarrhea for the last 2 days.  She reports emesis and diarrhea x2 today.  Both emesis and diarrhea are nonbloody.  She says she stopped taking all her medication except her Xarelto due to nausea and vomiting.  Last dose on Xarelto was at 7:30 PM yesterday.  Of note, she reports presenting to PCP about 2 weeks ago with cough which was productive with greenish phlegm and blood-tinge.  She says she was given antibiotic.  She denies smoking cigarettes, drinking alcohol or recreational drug use.  In ED, vital signs i significant for fever to 100.5, BP 90s/50s.  Labs with serum creatinine 1.19 (about baseline), elevated alk phos to 217, mildly elevated AST to 148 and ALT to 94, leukocytosis to 21.9 with left shift, hemoglobin to 9.6 and BNP to 204.  Lactic acid within normal limits.  UA with moderate hemoglobin and increased specific gravity.  DG chest with cardiac enlargement and mild vascular congestion.  CT abdomen with acute cholecystitis and markedly distended gallbladder, adjacent intrahepatic and pericholecystic fluid concerning for abscess.  Surgery was consulted by ED provider and  recommended IV antibiotic overnight for possible percutaneous drainage tomorrow.  Cultures were obtained.  She was given IV normal saline 1 L bolus and Zosyn.  Medicine was called to admit patient for further evaluation and management.  Review of Systems: As per HPI otherwise 10 point review of systems negative.  Review of Systems Otherwise negative except as per HPI, including: General: Denies fever,night sweats or unintended weight loss.  Admits to chills. Resp: Denies wheezing, shortness of breath.  Admits cough with productive phlegm and blood tinge.  Cardiac: Denies chest pain, palpitations, orthopnea, paroxysmal nocturnal dyspnea. GI: Positive for abdominal pain, nausea, vomiting and diarrhea. GU: Denies dysuria, frequency, hesitancy or incontinence MS: Denies muscle aches, joint pain or swelling Neuro: Denies headache, neurologic deficits (focal weakness, numbness, tingling), abnormal gait Psych: Positive for anxiety and depression.  Denies SI/HI. Skin: Denies new rashes or lesions ID: Denies sick contacts, exotic exposures, travel  Past Medical History:  Diagnosis Date  . Allergic rhinitis   . ALLERGIC RHINITIS 10/26/2009  . Anxiety   . ANXIETY 08/14/2006  . B12 DEFICIENCY 08/25/2007  . Complication of anesthesia    pt has had difficulty following anesthesia with her knee in 2016-unable to care for herself afterward  . Confusion   . Depression    takes Lexapro daily  . Diabetes mellitus without complication ([place])    was on insulin but has been off since Nov 2015 and now only takes Metformin daily  . DYSPNEA 04/28/2009   with exertion  . Esophageal reflux    takes  Nexium daily  . Fibromyalgia   . Headache    last migraine 2-64yr ago;takes Topamax daily  . History of shingles   . Hypertension    takes Coreg daily  . Insomnia    takes Nortriptyline nightly   . Joint pain   . Joint swelling   . Left knee pain   . Lichen planus   . Long-term memory loss   . Nocturia     . OSA (obstructive sleep apnea)    doesn't use CPAP;sleep study in epic from 2006  . Osteoarthritis   . Osteoarthritis   . Pneumonia    over 30 yrs ago  . Protein calorie malnutrition ([place]   . Rheumatoid arthritis ([place]   . Sarcoidosis    Dr. ZLolita Patella . Short-term memory loss   . Shortness of breath   . TIA (transient ischemic attack)   . Unsteady gait   . Urinary urgency   . Vitamin D deficiency    is supposed to take Vit D but can't afford it  . VITAMIN D DEFICIENCY 08/25/2007    Past Surgical History:  Procedure Laterality Date  . APPENDECTOMY    . arthroscopic knee surgery Right 11-12-04  . AXILLARY ABCESS IRRIGATION AND DEBRIDEMENT  Jul & Aug2012  . CARPAL TUNNEL RELEASE Left 05/23/2014   Procedure: CARPAL TUNNEL RELEASE;  Surgeon: SMeredith Pel MD;  [facility]  Service: Orthopedics;  Laterality: Left;  . cyst removed from top of buttocks  at age 57 . ENDOMETRIAL ABLATION    . LACRIMAL DUCT EXPLORATION Right 06/26/2017   Procedure: LACRIMAL DUCT EXPLORATION AND ETHMOIDECTOMY;  Surgeon: AClista Bernhardt MD;  [facility]  Service: Ophthalmology;  Laterality: Right;  . TEAR DUCT PROBING Right 06/26/2017   Procedure: TEAR DUCT PROBING WITH STENT;  Surgeon: AClista Bernhardt MD;  [facility]  Service: Ophthalmology;  Laterality: Right;  . TOTAL KNEE ARTHROPLASTY Right 11/15/2014   Procedure: TOTAL RIGHT KNEE ARTHROPLASTY;  Surgeon: SMeredith Pel MD;  [facility] [facility]  Service: Orthopedics;  Laterality: Right;  . TOTAL KNEE ARTHROPLASTY Left 07/13/2015   Procedure: LEFT TOTAL KNEE ARTHROPLASTY;  Surgeon: SMeredith Pel MD;  [place] [facility]  Service: Orthopedics;  Laterality: Left;    SOCIAL HISTORY:  reports that she has quit smoking. Her smoking use included cigarettes. She has a 5.00 pack-year smoking history. She has never used smokeless tobacco. She reports that she does not drink alcohol or use drugs.  Allergies  Allergen Reactions   . Belsomra [Suvorexant] Other (See Comments)    FGolden Circleout of bed while asleep: "I'm waking up as I'm falling on the floor;" "Night terrors"  . Enalapril Maleate Cough  . Latex Hives  . Nickel Other (See Comments)    Blisters  Pt has a titanium right and left knee - nickel causes skin irritations that form into blisters and sores  . Other Other (See Comments)    Patient has Sarcoidosis and can't tolerate any metals  . Iodine Other (See Comments)    Patient received IV contrast without pre-meds without any problems 09/12/17 kwl Mother was intolerant--CODED on CT table---pt never tried  . Doxycycline Diarrhea and Nausea And Vomiting  . Hydrochlorothiazide Other (See Comments)    Low potassium levels   . Hydroxychloroquine Sulfate Other (See Comments)    Vision changes   . Lyrica [Pregabalin] Other (See Comments)    "Made me feel high"  . Tape Other (See Comments)  Medical tape causes bruising    FAMILY HISTORY: Family History  Problem Relation Age of Onset  . Heart disease Mother   . Kidney disease Mother   . Cancer Father        leukemia  . Hypertension Unknown   . Coronary artery disease Unknown        female 1st degree relative <60  . Heart failure Unknown        congestive  . Coronary artery disease Unknown        Female 1st degree relative <50  . Breast cancer Unknown        1st degree relative <50 S  . Breast cancer Sister      Prior to Admission medications   Medication Sig Start Date End Date Taking? Authorizing Provider  Adalimumab (HUMIRA) 40 MG/0.8ML PSKT Inject 40 mg into the skin every 7 (seven) days.    Yes [provider]  budesonide-formoterol (SYMBICORT) 160-4.5 MCG/ACT inhaler Inhale 2 puffs into the lungs TWICE DAILY 120/4=30 06/10/17  Yes Rigoberto Noel, MD  carvedilol (COREG) 12.5 MG tablet Take 1 tablet (12.5 mg total) by mouth 2 (two) times daily with a meal. 06/12/17  Yes Plotnikov, Evie Lacks, MD  gabapentin (NEURONTIN) 300 MG capsule Take 1  capsule (300 mg total) by mouth 2 (two) times daily as needed (for pain). 06/12/17  Yes Plotnikov, Evie Lacks, MD  potassium chloride (K-DUR,KLOR-CON) 10 MEQ tablet Take 10 mEq by mouth daily.  09/08/17  Yes [provider]  XARELTO 20 MG TABS tablet Take 1 tablet (20 mg total) by mouth at bedtime. 06/12/17  Yes Plotnikov, Evie Lacks, MD  clonazePAM (KLONOPIN) 0.5 MG tablet Take 1-2 tablets (0.5-1 mg total) by mouth at bedtime. 08/04/17   Plotnikov, Evie Lacks, MD  esomeprazole (NEXIUM) 40 MG capsule Take 1 capsule (40 mg total) by mouth daily before breakfast. 08/07/17   Plotnikov, Evie Lacks, MD  furosemide (LASIX) 40 MG tablet TAKE 1 TABLET BY MOUTH EVERY MORNING FOR EDEMA Patient taking differently: Take 20 mg by mouth daily as needed for fluid or edema.  01/10/17   Plotnikov, Evie Lacks, MD  glucose blood (EZ SMART PLUS GLUCOSE TEST) test strip Use as instructed to test sugars 4 times daily DX: E11.8 09/11/17   Philemon Kingdom, MD  JANUVIA 50 MG tablet Take 1 tablet (50 mg total) by mouth daily. Patient taking differently: Take 50 mg by mouth daily.  09/10/17   Philemon Kingdom, MD  Lancets (ACCU-CHEK MULTICLIX) lancets Use as instructed to check sugars 4 times daily DX: E11.8 06/30/17   Philemon Kingdom, MD  metFORMIN (GLUCOPHAGE-XR) 500 MG 24 hr tablet TAKE 2 TABLETS(1000 MG) BY MOUTH DAILY WITH BREAKFAST 06/12/17   Plotnikov, Evie Lacks, MD  promethazine (PHENERGAN) 25 MG tablet Take 1 tablet (25 mg total) by mouth every 8 (eight) hours as needed for nausea or vomiting. 09/03/17   Plotnikov, Evie Lacks, MD  topiramate (TOPAMAX) 50 MG tablet Take 1 tablet (50 mg total) by mouth 2 (two) times daily. 06/12/17   Plotnikov, Evie Lacks, MD  venlafaxine XR (EFFEXOR-XR) 75 MG 24 hr capsule Take 1 capsule (75 mg total) by mouth daily with breakfast. 03/04/17   Plotnikov, Evie Lacks, MD  VENTOLIN HFA 108 (90 Base) MCG/ACT inhaler Inhale 2 puffs into the lungs every 6 (six) hours as needed for wheezing or  shortness of breath. 200/8=25 Patient taking differently: Inhale 2 puffs into the lungs every 6 (six) hours as needed for wheezing or  shortness of breath.  12/11/16   Plotnikov, Evie Lacks, MD  VOLTAREN 1 % GEL Apply 2 g topically 2 (two) times daily as needed (for pain). 100/4=25 Patient taking differently: Apply 4 g topically 4 (four) times daily as needed (pain).  04/29/17   Plotnikov, Evie Lacks, MD    Physical Exam: Vitals:   09/12/17 1730 09/12/17 1930 09/12/17 1945 09/12/17 2000  BP: (!) 96/43 (!) 80/66 (!) 86/51 (!) 90/43  Pulse: 82 76 73 75  Resp: (!) 24 17 15 19   Temp:      TempSrc:      SpO2: 94% 93% 93% 90%   GEN: No apparent distress.  Lying in bed with nasal cannula in place Head: normocephalic and atraumatic  Eyes: conjunctiva without injection. Sclera anicteric. Oropharynx: Mildly parched mucous membranes HEM: negative for cervical or periauricular lymphadenopathies CVS: RRR, nl s1 & s2, no murmurs.  Trace edema RESP: no IWOB, good air movement bilaterally, CTAB GI: BS present & normal, soft.  Tenderness to palpation of epigastric and right upper quadrant.  Positive Murphy sign GU: no suprapubic or CVA tenderness.  MSK: no focal tenderness or notable swelling SKIN: no apparent skin lesion NEURO: alert and oiented appropriately, no gross deficits   PSYCH: euthymic mood with congruent affect.  No SI/HI   Labs on Admission: I have personally reviewed following labs and imaging studies  CBC: Recent Labs  Lab 09/12/17 1510  WBC 21.9*  NEUTROABS 19.0*  HGB 9.6*  HCT 30.7*  MCV 90.8  PLT 952   Basic Metabolic Panel: Recent Labs  Lab 09/12/17 1510  NA 138  K 3.8  CL 105  CO2 24  GLUCOSE 129*  BUN 11  CREATININE 1.19*  CALCIUM 8.1*   GFR: CrCl cannot be calculated (Unknown ideal weight.). Liver Function Tests: Recent Labs  Lab 09/12/17 1510  AST 148*  ALT 94*  ALKPHOS 217*  BILITOT 3.3*  PROT 7.1  ALBUMIN 2.3*   Recent Labs  Lab  09/12/17 1510  LIPASE 34   No results for input(s): AMMONIA in the last 168 hours. Coagulation Profile: No results for input(s): INR, PROTIME in the last 168 hours. Cardiac Enzymes: No results for input(s): CKTOTAL, CKMB, CKMBINDEX, TROPONINI in the last 168 hours. BNP (last 3 results) Recent Labs    11/11/16 1051  PROBNP 194.0*   HbA1C: No results for input(s): HGBA1C in the last 72 hours. CBG: No results for input(s): GLUCAP in the last 168 hours. Lipid Profile: No results for input(s): CHOL, HDL, LDLCALC, TRIG, CHOLHDL, LDLDIRECT in the last 72 hours. Thyroid Function Tests: No results for input(s): TSH, T4TOTAL, FREET4, T3FREE, THYROIDAB in the last 72 hours. Anemia Panel: No results for input(s): VITAMINB12, FOLATE, FERRITIN, TIBC, IRON, RETICCTPCT in the last 72 hours. Urine analysis:    Component Value Date/Time   COLORURINE AMBER (A) 09/12/2017 1807   APPEARANCEUR CLEAR 09/12/2017 1807   LABSPEC 1.044 (H) 09/12/2017 1807   PHURINE 6.0 09/12/2017 1807   GLUCOSEU NEGATIVE 09/12/2017 1807   GLUCOSEU NEGATIVE 04/27/2013 1218   HGBUR MODERATE (A) 09/12/2017 1807   BILIRUBINUR SMALL (A) 09/12/2017 1807   KETONESUR NEGATIVE 09/12/2017 1807   PROTEINUR NEGATIVE 09/12/2017 1807   UROBILINOGEN 1.0 11/08/2014 1050   NITRITE NEGATIVE 09/12/2017 1807   LEUKOCYTESUR NEGATIVE 09/12/2017 1807   Sepsis Labs: !!!!!!!!!!!!!!!!!!!!!!!!!!!!!!!!!!!!!!!!!!!! @LABRCNTIP (procalcitonin:4,lacticidven:4) )No results found for this or any previous visit (from the past 240 hour(s)).   Radiological Exams on Admission: Dg Chest 2 View  Result Date: 09/12/2017  CLINICAL DATA:  Productive cough and chills EXAM: CHEST - 2 VIEW COMPARISON:  06/02/2017 FINDINGS: Cardiac enlargement with mild vascular congestion. Negative for edema or effusion. Negative for pneumonia IMPRESSION: Cardiac enlargement and mild vascular congestion. Negative for pneumonia or edema. Electronically Signed   By: Franchot Gallo M.D.   On: 09/12/2017 18:01   Ct Abdomen Pelvis W Contrast  Result Date: 09/12/2017 CLINICAL DATA:  Acute generalized abdominal pain x2 weeks with diarrhea and weight loss. EXAM: CT ABDOMEN AND PELVIS WITH CONTRAST TECHNIQUE: Multidetector CT imaging of the abdomen and pelvis was performed using the standard protocol following bolus administration of intravenous contrast. CONTRAST:  157m ISOVUE-300 IOPAMIDOL (ISOVUE-300) INJECTION 61% COMPARISON:  None. FINDINGS: Lower chest: Heart size is normal. Minimal atelectatic subpleural nodular density in the left lower lobe on axial series 6/11 most likely represents a small focus of atelectasis given its more band like appearance on coronal reformats. No effusion or pulmonary consolidation. Hepatobiliary: Distended gallbladder with transmural thickening and pericholecystic fluid consistent with acute cholecystitis is identified. Complicating this finding are areas of low attenuation involving the adjacent liver and gallbladder fossa with mild peripheral enhancement concerning for adjacent multifocal intrahepatic abscesses. A gallstone is identified. The common duct is normal. Otherwise, no enhancing mass of the liver is seen. Pancreas: No pancreatic ductal dilatation, enhancing mass or inflammation. Spleen: Normal size spleen. Adrenals/Urinary Tract: Normal bilateral adrenal glands. Symmetric enhancement of the kidneys with small cyst arising off the interpolar right kidney measuring 13 mm. No hydroureteronephrosis. The urinary bladder is unremarkable. Stomach/Bowel: Small hiatal hernia. Decompressed stomach with normal small bowel rotation. No small-bowel obstruction. The distal and terminal ileum are normal. Patient is status post appendectomy. Sympathetic thickening of the colon traversing the right upper quadrant and mid upper abdomen is identified secondary to the inflamed gallbladder. Otherwise, there is scattered colonic diverticulosis along the  descending colon with greater degree of sigmoid diverticulosis and circular muscle hypertrophy identified. Vascular/Lymphatic: Aortoiliac atherosclerosis. No pathologically enlarged lymph nodes. Small subcentimeter short axis periportal lymph nodes likely reactive in etiology are noted. Reproductive: The uterus and adnexa are unremarkable. Other: No free air free fluid. Musculoskeletal: No acute nor aggressive osseous abnormality. Degenerative changes are present along the dorsal spine. IMPRESSION: Acute cholecystitis with markedly distended gallbladder, transmural thickening and pericholecystic inflammation identified. Complicating this finding are adjacent intrahepatic and pericholecystic fluid attenuating hypodensities with slight peripheral enhancement compatible with adjacent abscesses some which appear to be intrahepatic subjacent to the gallbladder fossa. 13 mm cyst arising from the interpolar right without worrisome features. Colonic diverticulosis without acute diverticulitis. Electronically Signed   By: DAshley RoyaltyM.D.   On: 09/12/2017 17:23     All images have been reviewed by me personally.  EKG: Independently reviewed.  Normal sinus rhythm without significant acute ischemic finding.  Assessment/Plan Principal Problem:   Cholecystitis Active Problems:   Major depressive disorder, recurrent episode ([place]   Essential hypertension   CAD in native artery   Controlled type 2 diabetes mellitus with chronic kidney disease, without long-term current use of insulin (HCC)   Pulmonary embolism (HCC)   OSA (obstructive sleep apnea)   Diastolic dysfunction   COPD (chronic obstructive pulmonary disease) ([place]    Sepsis: patient with fever, hypotension and leukocytosis.  Likely source cholecystitis. Status post a liter of normal saline and Zosyn in ED.  Hypotension improved. Patient on immune modulating drug and diabetes.  -We will give 500 cc normal saline bolus again.  CXR concerning for  cardiac  megaly and pulmonary congestion. -Continue Zosyn -Add vancomycin -Follow cultures. -Admit to MedSurg.  Low threshold for ICU -Manage cholecystitis as below.   Cholecystitis with possible intrahepatic and pericholecystic abscess -IV fluid and antibiotic as above -IV Dilaudid 1 mg every 3 hours as needed for pain -Surgery consulted in ED. follow-up recommendations. -N.p.o. after midnight  History of CAD:  -Hold home Coreg while hypotensive -Not on statin or aspirin.  COPD on 2 L by nasal cannula: Reports cough with greenish phlegm and blood-tinged.  Denies dyspnea or chest pain. -Dulera -Duo nebs every 6 hours as needed -Consider changing her Coreg to selective beta-blocker  Diabetes: On metformin and Januvia at home -Sliding scale insulin -CBG ACHS -Resume home gabapentin in the morning  HFpEF: CXR concerning for pulmonary congestion.  BNP mildly elevated to 200.  -Closely watch respiratory status while on IV fluid.  -Holding home Lasix for now  DVT/PE: On Xarelto at home.  Last dose about 24 hours ago -hold home Xarelto -Heparin per pharmacy  OSA: Does not use CPAP -Declined CPAP  MDD/anxiety: Stable -Continue home Effexor in the morning. -Continue home Klonopin -Resume home Effexor in the morning.  DVT prophylaxis: On heparin drip Code Status: Full code Family Communication: No family member at bedside Disposition Plan: Stepdown Consults called: Surgery consulted in ED to see patient in a.m. Admission status: Inpatient   Mercy Riding MD [facility] Pager [phone]  If 7PM-7AM, please contact night-coverage [email or web address] Password [facility]  09/12/2017, 8:36 PM

## 2017-09-12 NOTE — ED Notes (Signed)
Bed: WA03 Expected date:  Expected time:  Means of arrival:  Comments: 57 yo weakness, hypotension

## 2017-09-12 NOTE — Progress Notes (Signed)
ANTICOAGULATION CONSULT NOTE - Initial Consult  Pharmacy Consult for IV heparin Indication: History of DVT  Allergies  Allergen Reactions  . Belsomra [Suvorexant] Other (See Comments)    [place] out of bed while asleep: "I'm waking up as I'm falling on the floor;" "Night terrors"  . Enalapril Maleate Cough  . Latex Hives  . Nickel Other (See Comments)    Blisters  Pt has a titanium right and left knee - nickel causes skin irritations that form into blisters and sores  . Other Other (See Comments)    Patient has Sarcoidosis and can't tolerate any metals  . Iodine Other (See Comments)    Patient received IV contrast without pre-meds without any problems 09/12/17 kwl Mother was intolerant--CODED on CT table---pt never tried  . Doxycycline Diarrhea and Nausea And Vomiting  . Hydrochlorothiazide Other (See Comments)    Low potassium levels   . Hydroxychloroquine Sulfate Other (See Comments)    Vision changes   . Lyrica [Pregabalin] Other (See Comments)    "Made me feel high"  . Tape Other (See Comments)    Medical tape causes bruising    Patient Measurements:   Heparin Dosing Weight: 92 kg   Vital Signs: Temp: 100.5 F (38.1 C) (08/30 1723) Temp Source: Oral (08/30 1723) BP: 90/43 (08/30 2000) Pulse Rate: 75 (08/30 2000)  Labs: Recent Labs    09/12/17 1510  HGB 9.6*  HCT 30.7*  PLT 349  CREATININE 1.19*    CrCl cannot be calculated (Unknown ideal weight.).   Medical History: Past Medical History:  Diagnosis Date  . Allergic rhinitis   . ALLERGIC RHINITIS 10/26/2009  . Anxiety   . ANXIETY 08/14/2006  . B12 DEFICIENCY 08/25/2007  . Complication of anesthesia    pt has had difficulty following anesthesia with her knee in 2016-unable to care for herself afterward  . Confusion   . Depression    takes Lexapro daily  . Diabetes mellitus without complication ([place])    was on insulin but has been off since Nov 2015 and now only takes Metformin daily  . DYSPNEA 04/28/2009    with exertion  . Esophageal reflux    takes Nexium daily  . Fibromyalgia   . Headache    last migraine 2-48yrs ago;takes Topamax daily  . History of shingles   . Hypertension    takes Coreg daily  . Insomnia    takes Nortriptyline nightly   . Joint pain   . Joint swelling   . Left knee pain   . Lichen planus   . Long-term memory loss   . Nocturia   . OSA (obstructive sleep apnea)    doesn't use CPAP;sleep study in epic from 2006  . Osteoarthritis   . Osteoarthritis   . Pneumonia    over 30 yrs ago  . Protein calorie malnutrition ([place])   . Rheumatoid arthritis ([place])   . Sarcoidosis    Dr. Lolita Patella  . Short-term memory loss   . Shortness of breath   . TIA (transient ischemic attack)   . Unsteady gait   . Urinary urgency   . Vitamin D deficiency    is supposed to take Vit D but can't afford it  . VITAMIN D DEFICIENCY 08/25/2007    Medications:  Scheduled:  . iopamidol        Assessment: Pharmacy is consulted to start heparin drip in 57 yo female with PMH of DVT. Pt is on Xarelto 20 mg PO daily. Pt is being  started on heparin as pt is also being evaluated for cholecystitis  Per med rec last dose of Xarelto was on 8/29 at [facility].  Of note, no weight has been entered while on this visit to ED. Dosing is based on a previous entry from 08/27/17 that had the pt's listed weight as 127 kg.   Today, 09/12/17   hgb 9.6, plt 349  Scr 1.19 mg/dl   Goal of Therapy:  Heparin level 0.3-0.7 units/ml Monitor platelets by anticoagulation protocol: Yes   Plan:   Start heparin at 1600 units/hr.  With pt recently taking Xarelto, will obtain HL level as well as aPTT levels. Will dose based on aPTT levels until HL and aPTT correlate  Monitor for signs and symptoms of bleeding   Royetta Asal, PharmD, BCPS Pager [phone] 09/12/2017 8:38 PM

## 2017-09-13 ENCOUNTER — Encounter (HOSPITAL_COMMUNITY): Payer: Self-pay | Admitting: Interventional Radiology

## 2017-09-13 ENCOUNTER — Inpatient Hospital Stay (HOSPITAL_COMMUNITY): Payer: Medicare Other

## 2017-09-13 ENCOUNTER — Other Ambulatory Visit: Payer: Self-pay

## 2017-09-13 DIAGNOSIS — K819 Cholecystitis, unspecified: Secondary | ICD-10-CM

## 2017-09-13 DIAGNOSIS — D72829 Elevated white blood cell count, unspecified: Secondary | ICD-10-CM

## 2017-09-13 DIAGNOSIS — I5032 Chronic diastolic (congestive) heart failure: Secondary | ICD-10-CM

## 2017-09-13 HISTORY — PX: IR PERC CHOLECYSTOSTOMY: IMG2326

## 2017-09-13 LAB — COMPREHENSIVE METABOLIC PANEL
ALT: 95 U/L — ABNORMAL HIGH (ref 0–44)
AST: 127 U/L — ABNORMAL HIGH (ref 15–41)
Albumin: 2.4 g/dL — ABNORMAL LOW (ref 3.5–5.0)
Alkaline Phosphatase: 215 U/L — ABNORMAL HIGH (ref 38–126)
Anion gap: 10 (ref 5–15)
BILIRUBIN TOTAL: 4 mg/dL — AB (ref 0.3–1.2)
BUN: 14 mg/dL (ref 6–20)
CALCIUM: 8.4 mg/dL — AB (ref 8.9–10.3)
CO2: 25 mmol/L (ref 22–32)
Chloride: 105 mmol/L (ref 98–111)
Creatinine, Ser: 1.43 mg/dL — ABNORMAL HIGH (ref 0.44–1.00)
GFR calc Af Amer: 46 mL/min — ABNORMAL LOW (ref >60)
GFR calc non Af Amer: 40 mL/min — ABNORMAL LOW (ref >60)
Glucose, Bld: 137 mg/dL — ABNORMAL HIGH (ref 70–99)
POTASSIUM: 3.9 mmol/L (ref 3.5–5.1)
Sodium: 140 mmol/L (ref 135–145)
TOTAL PROTEIN: 7.3 g/dL (ref 6.5–8.1)

## 2017-09-13 LAB — CBC
HEMATOCRIT: 29.8 % — AB (ref 36.0–46.0)
HEMOGLOBIN: 9 g/dL — AB (ref 12.0–15.0)
MCH: 28 pg (ref 26.0–34.0)
MCHC: 30.2 g/dL (ref 30.0–36.0)
MCV: 92.8 fL (ref 78.0–100.0)
Platelets: 419 10*3/uL — ABNORMAL HIGH (ref 150–400)
RBC: 3.21 MIL/uL — ABNORMAL LOW (ref 3.87–5.11)
RDW: 15.8 % — AB (ref 11.5–15.5)
WBC: 22.6 10*3/uL — ABNORMAL HIGH (ref 4.0–10.5)

## 2017-09-13 LAB — GLUCOSE, CAPILLARY
GLUCOSE-CAPILLARY: 117 mg/dL — AB (ref 70–99)
GLUCOSE-CAPILLARY: 88 mg/dL (ref 70–99)
GLUCOSE-CAPILLARY: 89 mg/dL (ref 70–99)
Glucose-Capillary: 77 mg/dL (ref 70–99)
Glucose-Capillary: 87 mg/dL (ref 70–99)

## 2017-09-13 LAB — SURGICAL PCR SCREEN
MRSA, PCR: NEGATIVE
STAPHYLOCOCCUS AUREUS: POSITIVE — AB

## 2017-09-13 LAB — HEPARIN LEVEL (UNFRACTIONATED)
HEPARIN UNFRACTIONATED: 1.52 [IU]/mL — AB (ref 0.30–0.70)
Heparin Unfractionated: >2.2 IU/mL — ABNORMAL HIGH (ref 0.30–0.70)

## 2017-09-13 LAB — APTT
APTT: 76 s — AB (ref 24–36)
APTT: 67 s — AB (ref 24–36)
aPTT: 75 seconds — ABNORMAL HIGH (ref 24–36)

## 2017-09-13 LAB — HIV ANTIBODY (ROUTINE TESTING W REFLEX): HIV Screen 4th Generation wRfx: NONREACTIVE

## 2017-09-13 MED ORDER — FENTANYL CITRATE (PF) 100 MCG/2ML IJ SOLN
INTRAMUSCULAR | Status: AC
  Filled 2017-09-13: qty 4

## 2017-09-13 MED ORDER — LIDOCAINE HCL 1 % IJ SOLN
INTRAMUSCULAR | Status: AC
  Filled 2017-09-13: qty 20

## 2017-09-13 MED ORDER — MIDAZOLAM HCL 2 MG/2ML IJ SOLN
INTRAMUSCULAR | Status: AC
  Filled 2017-09-13: qty 4

## 2017-09-13 MED ORDER — SODIUM CHLORIDE 0.9 % IV SOLN
INTRAVENOUS | Status: AC | PRN
  Administered 2017-09-13: 10 mL/h via INTRAVENOUS

## 2017-09-13 MED ORDER — FENTANYL CITRATE (PF) 100 MCG/2ML IJ SOLN
INTRAMUSCULAR | Status: AC | PRN
  Administered 2017-09-13: 50 ug via INTRAVENOUS

## 2017-09-13 MED ORDER — SODIUM CHLORIDE 0.9% FLUSH
5.0000 mL | Freq: Three times a day (TID) | INTRAVENOUS | Status: DC
  Administered 2017-09-14 – 2017-09-19 (×13): 5 mL

## 2017-09-13 MED ORDER — VANCOMYCIN HCL IN DEXTROSE 1-5 GM/200ML-% IV SOLN
1000.0000 mg | INTRAVENOUS | Status: DC
  Administered 2017-09-13 – 2017-09-16 (×4): 1000 mg via INTRAVENOUS
  Filled 2017-09-13 (×5): qty 200

## 2017-09-13 MED ORDER — MUPIROCIN 2 % EX OINT
1.0000 "application " | TOPICAL_OINTMENT | Freq: Two times a day (BID) | CUTANEOUS | Status: AC
  Administered 2017-09-13 – 2017-09-14 (×5): 1 via NASAL
  Filled 2017-09-13: qty 22

## 2017-09-13 MED ORDER — MIDAZOLAM HCL 2 MG/2ML IJ SOLN
INTRAMUSCULAR | Status: AC | PRN
  Administered 2017-09-13: 1 mg via INTRAVENOUS

## 2017-09-13 MED ORDER — IOPAMIDOL (ISOVUE-300) INJECTION 61%
INTRAVENOUS | Status: AC
  Filled 2017-09-13: qty 50

## 2017-09-13 MED ORDER — TRAMADOL HCL 50 MG PO TABS
50.0000 mg | ORAL_TABLET | Freq: Once | ORAL | Status: AC
  Administered 2017-09-13: 50 mg via ORAL
  Filled 2017-09-13: qty 1

## 2017-09-13 NOTE — Progress Notes (Signed)
ANTICOAGULATION CONSULT NOTE - Follow Up Consult  Pharmacy Consult for Heparin Indication: History of DVT - bridge rivaroxaban  Allergies  Allergen Reactions  . Belsomra [Suvorexant] Other (See Comments)    [place] out of bed while asleep: "I'm waking up as I'm falling on the floor;" "Night terrors"  . Enalapril Maleate Cough  . Latex Hives  . Nickel Other (See Comments)    Blisters  Pt has a titanium right and left knee - nickel causes skin irritations that form into blisters and sores  . Other Other (See Comments)    Patient has Sarcoidosis and can't tolerate any metals  . Iodine Other (See Comments)    Patient received IV contrast without pre-meds without any problems 09/12/17 kwl Mother was intolerant--CODED on CT table---pt never tried  . Doxycycline Diarrhea and Nausea And Vomiting  . Hydrochlorothiazide Other (See Comments)    Low potassium levels   . Hydroxychloroquine Sulfate Other (See Comments)    Vision changes   . Lyrica [Pregabalin] Other (See Comments)    "Made me feel high"  . Tape Other (See Comments)    Medical tape causes bruising    Patient Measurements: Height: 5\' 7"  (170.2 cm) Weight: 264 lb 8.8 oz (120 kg) IBW/kg (Calculated) : 61.6 Heparin Dosing Weight:   Vital Signs: Temp: 100 F (37.8 C) (08/31 2108) Temp Source: Oral (08/31 2108) BP: 104/51 (08/31 2108) Pulse Rate: 83 (08/31 2108)  Labs: Recent Labs    09/12/17 1510 09/13/17 0413 09/13/17 1211 09/13/17 2150  HGB 9.6* 9.0*  --   --   HCT 30.7* 29.8*  --   --   PLT 349 419*  --   --   APTT  --  76* 75* 67*  HEPARINUNFRC  --  >2.20* >2.20* 1.52*  CREATININE 1.19* 1.43*  --   --     Estimated Creatinine Clearance: 58.2 mL/min (A) (by C-G formula based on SCr of 1.43 mg/dL (H)).   Medications:  Infusions:  . sodium chloride 10 mL/hr at 09/13/17 1443  . sodium chloride 75 mL/hr at 09/13/17 1443  . heparin 1,600 Units/hr (09/13/17 1921)  . piperacillin-tazobactam (ZOSYN)  IV 3.375 g  (09/13/17 1510)  . vancomycin 1,000 mg (09/13/17 2128)    Assessment: Patient with PTT at goal but high heparin level.  PTT ordered with Heparin level until both correlate due to possible drug-lab interaction between oral anticoagulant (rivaroxaban, edoxaban, or apixaban) and anti-Xa level (aka heparin level)  No heparin issues per RN.  Goal of Therapy:  Heparin level 0.3-0.7 units/ml aPTT 66-102 seconds Monitor platelets by anticoagulation protocol: Yes   Plan:  Increase heparin to keep PTT within goal range to 1700 units/hr Recheck levels at 0800  [name] 09/13/2017,11:23 PM

## 2017-09-13 NOTE — Progress Notes (Signed)
Chief Complaint: Patient was seen in consultation today for perc chole drain at the request of Dr. Leighton Ruff  Referring Physician(s): Dr. Leighton Ruff  Supervising Physician: Jacqulynn Cadet  Patient Status: [facility] - In-pt  History of Present Illness: [name] is a 57 y.o. female admitted with cholecystitis after 2 weeks of progressively worse RUQ pain. US shows stones and wall thickening c/w cholecystitis. The pt is also febrile with elevated WBC. She has been seen by Dr. Marcello Moores, who feels pt is high risk for surgery at this time due to comorbidities. IR is asked to place perc chole drain. Chart, imaging, meds, labs, allergies reviewed.   Past Medical History:  Diagnosis Date  . Allergic rhinitis   . ALLERGIC RHINITIS 10/26/2009  . Anxiety   . ANXIETY 08/14/2006  . B12 DEFICIENCY 08/25/2007  . Complication of anesthesia    pt has had difficulty following anesthesia with her knee in 2016-unable to care for herself afterward  . Confusion   . Depression    takes Lexapro daily  . Diabetes mellitus without complication ([place])    was on insulin but has been off since Nov 2015 and now only takes Metformin daily  . DYSPNEA 04/28/2009   with exertion  . Esophageal reflux    takes Nexium daily  . Fibromyalgia   . Headache    last migraine 2-89yrs ago;takes Topamax daily  . History of shingles   . Hypertension    takes Coreg daily  . Insomnia    takes Nortriptyline nightly   . Joint pain   . Joint swelling   . Left knee pain   . Lichen planus   . Long-term memory loss   . Nocturia   . OSA (obstructive sleep apnea)    doesn't use CPAP;sleep study in epic from 2006  . Osteoarthritis   . Osteoarthritis   . Pneumonia    over 30 yrs ago  . Protein calorie malnutrition ([place])   . Rheumatoid arthritis ([place])   . Sarcoidosis    Dr. Lolita Patella  . Short-term memory loss   . Shortness of breath   . TIA (transient ischemic attack)   . Unsteady gait   .  Urinary urgency   . Vitamin D deficiency    is supposed to take Vit D but can't afford it  . VITAMIN D DEFICIENCY 08/25/2007    Past Surgical History:  Procedure Laterality Date  . APPENDECTOMY    . arthroscopic knee surgery Right 11-12-04  . AXILLARY ABCESS IRRIGATION AND DEBRIDEMENT  Jul & Aug2012  . CARPAL TUNNEL RELEASE Left 05/23/2014   Procedure: CARPAL TUNNEL RELEASE;  Surgeon: Meredith Pel, MD;  Location: [place];  Service: Orthopedics;  Laterality: Left;  . cyst removed from top of buttocks  at age 45  . ENDOMETRIAL ABLATION    . LACRIMAL DUCT EXPLORATION Right 06/26/2017   Procedure: LACRIMAL DUCT EXPLORATION AND ETHMOIDECTOMY;  Surgeon: Clista Bernhardt, MD;  Location: [place];  Service: Ophthalmology;  Laterality: Right;  . TEAR DUCT PROBING Right 06/26/2017   Procedure: TEAR DUCT PROBING WITH STENT;  Surgeon: Clista Bernhardt, MD;  Location: [place];  Service: Ophthalmology;  Laterality: Right;  . TOTAL KNEE ARTHROPLASTY Right 11/15/2014   Procedure: TOTAL RIGHT KNEE ARTHROPLASTY;  Surgeon: Meredith Pel, MD;  Location: [place];  Service: Orthopedics;  Laterality: Right;  . TOTAL KNEE ARTHROPLASTY Left 07/13/2015   Procedure: LEFT TOTAL KNEE ARTHROPLASTY;  Surgeon: Meredith Pel, MD;  Location:  [place] [facility];  Service: Orthopedics;  Laterality: Left;    Allergies: Belsomra [suvorexant]; Enalapril maleate; Latex; Nickel; Other; Iodine; Doxycycline; Hydrochlorothiazide; Hydroxychloroquine sulfate; Lyrica [pregabalin]; and Tape  Medications: Prior to Admission medications   Medication Sig Start Date End Date Taking? Authorizing Provider  Adalimumab (HUMIRA) 40 MG/0.8ML PSKT Inject 40 mg into the skin every 7 (seven) days.    Yes [provider]  budesonide-formoterol (SYMBICORT) 160-4.5 MCG/ACT inhaler Inhale 2 puffs into the lungs TWICE DAILY 120/4=30 06/10/17  Yes Rigoberto Noel, MD  carvedilol (COREG) 12.5 MG tablet Take 1 tablet (12.5 mg total) by mouth 2 (two)  times daily with a meal. 06/12/17  Yes Plotnikov, Evie Lacks, MD  gabapentin (NEURONTIN) 300 MG capsule Take 1 capsule (300 mg total) by mouth 2 (two) times daily as needed (for pain). 06/12/17  Yes Plotnikov, Evie Lacks, MD  potassium chloride (K-DUR,KLOR-CON) 10 MEQ tablet Take 10 mEq by mouth daily.  09/08/17  Yes [provider]  XARELTO 20 MG TABS tablet Take 1 tablet (20 mg total) by mouth at bedtime. 06/12/17  Yes Plotnikov, Evie Lacks, MD  clonazePAM (KLONOPIN) 0.5 MG tablet Take 1-2 tablets (0.5-1 mg total) by mouth at bedtime. 08/04/17   Plotnikov, Evie Lacks, MD  esomeprazole (NEXIUM) 40 MG capsule Take 1 capsule (40 mg total) by mouth daily before breakfast. 08/07/17   Plotnikov, Evie Lacks, MD  furosemide (LASIX) 40 MG tablet TAKE 1 TABLET BY MOUTH EVERY MORNING FOR EDEMA Patient taking differently: Take 20 mg by mouth daily as needed for fluid or edema.  01/10/17   Plotnikov, Evie Lacks, MD  glucose blood (EZ SMART PLUS GLUCOSE TEST) test strip Use as instructed to test sugars 4 times daily DX: E11.8 09/11/17   Philemon Kingdom, MD  JANUVIA 50 MG tablet Take 1 tablet (50 mg total) by mouth daily. Patient taking differently: Take 50 mg by mouth daily.  09/10/17   Philemon Kingdom, MD  Lancets (ACCU-CHEK MULTICLIX) lancets Use as instructed to check sugars 4 times daily DX: E11.8 06/30/17   Philemon Kingdom, MD  metFORMIN (GLUCOPHAGE-XR) 500 MG 24 hr tablet TAKE 2 TABLETS(1000 MG) BY MOUTH DAILY WITH BREAKFAST 06/12/17   Plotnikov, Evie Lacks, MD  promethazine (PHENERGAN) 25 MG tablet Take 1 tablet (25 mg total) by mouth every 8 (eight) hours as needed for nausea or vomiting. 09/03/17   Plotnikov, Evie Lacks, MD  topiramate (TOPAMAX) 50 MG tablet Take 1 tablet (50 mg total) by mouth 2 (two) times daily. 06/12/17   Plotnikov, Evie Lacks, MD  venlafaxine XR (EFFEXOR-XR) 75 MG 24 hr capsule Take 1 capsule (75 mg total) by mouth daily with breakfast. 03/04/17   Plotnikov, Evie Lacks, MD  VENTOLIN HFA  108 (90 Base) MCG/ACT inhaler Inhale 2 puffs into the lungs every 6 (six) hours as needed for wheezing or shortness of breath. 200/8=25 Patient taking differently: Inhale 2 puffs into the lungs every 6 (six) hours as needed for wheezing or shortness of breath.  12/11/16   Plotnikov, Evie Lacks, MD  VOLTAREN 1 % GEL Apply 2 g topically 2 (two) times daily as needed (for pain). 100/4=25 Patient taking differently: Apply 4 g topically 4 (four) times daily as needed (pain).  04/29/17   Plotnikov, Evie Lacks, MD     Family History  Problem Relation Age of Onset  . Heart disease Mother   . Kidney disease Mother   . Cancer Father        leukemia  . Hypertension Unknown   .  Coronary artery disease Unknown        female 1st degree relative <60  . Heart failure Unknown        congestive  . Coronary artery disease Unknown        Female 1st degree relative <50  . Breast cancer Unknown        1st degree relative <50 S  . Breast cancer Sister     Social History   Socioeconomic History  . Marital status: Single    Spouse name: Not on file  . Number of children: Not on file  . Years of education: Not on file  . Highest education level: Not on file  Occupational History  . Occupation: disabled  Social Needs  . Financial resource strain: Not on file  . Food insecurity:    Worry: Not on file    Inability: Not on file  . Transportation needs:    Medical: Not on file    Non-medical: Not on file  Tobacco Use  . Smoking status: Former Smoker    Packs/day: 0.50    Years: 10.00    Pack years: 5.00    Types: Cigarettes  . Smokeless tobacco: Never Used  . Tobacco comment: quit smoking in 2004  Substance and Sexual Activity  . Alcohol use: No  . Drug use: No  . Sexual activity: Not Currently  Lifestyle  . Physical activity:    Days per week: Not on file    Minutes per session: Not on file  . Stress: Not on file  Relationships  . Social connections:    Talks on phone: Not on file    Gets  together: Not on file    Attends religious service: Not on file    Active member of club or organization: Not on file    Attends meetings of clubs or organizations: Not on file    Relationship status: Not on file  Other Topics Concern  . Not on file  Social History Narrative   Single, broke up with partner in 2008.     Review of Systems: A 12 point ROS discussed and pertinent positives are indicated in the HPI above.  All other systems are negative.  Review of Systems  Vital Signs: BP 110/60 (BP Location: Right Arm)   Pulse 87   Temp 99.8 F (37.7 C) (Oral)   Resp 17   Ht 5\' 7"  (1.702 m)   Wt 120 kg   SpO2 93%   BMI 41.43 kg/m   Physical Exam  Constitutional: She is oriented to person, place, and time. She appears well-developed. No distress.  Morbidly obese  HENT:  Head: Normocephalic.  Mouth/Throat: Oropharynx is clear and moist.  Neck: Normal range of motion. No JVD present.  Cardiovascular: Normal rate, regular rhythm and normal heart sounds.  Pulmonary/Chest: Effort normal and breath sounds normal. No respiratory distress.  Abdominal: Soft. She exhibits no distension.  Tender RUQ  Neurological: She is alert and oriented to person, place, and time.  Skin: Skin is warm and dry.  Psychiatric: She has a normal mood and affect.    Imaging: Dg Chest 2 View  Result Date: 09/12/2017 CLINICAL DATA:  Productive cough and chills EXAM: CHEST - 2 VIEW COMPARISON:  06/02/2017 FINDINGS: Cardiac enlargement with mild vascular congestion. Negative for edema or effusion. Negative for pneumonia IMPRESSION: Cardiac enlargement and mild vascular congestion. Negative for pneumonia or edema. Electronically Signed   By: Franchot Gallo M.D.   On: 09/12/2017 18:01  Ct Abdomen Pelvis W Contrast  Result Date: 09/12/2017 CLINICAL DATA:  Acute generalized abdominal pain x2 weeks with diarrhea and weight loss. EXAM: CT ABDOMEN AND PELVIS WITH CONTRAST TECHNIQUE: Multidetector CT imaging of  the abdomen and pelvis was performed using the standard protocol following bolus administration of intravenous contrast. CONTRAST:  140mL ISOVUE-300 IOPAMIDOL (ISOVUE-300) INJECTION 61% COMPARISON:  None. FINDINGS: Lower chest: Heart size is normal. Minimal atelectatic subpleural nodular density in the left lower lobe on axial series 6/11 most likely represents a small focus of atelectasis given its more band like appearance on coronal reformats. No effusion or pulmonary consolidation. Hepatobiliary: Distended gallbladder with transmural thickening and pericholecystic fluid consistent with acute cholecystitis is identified. Complicating this finding are areas of low attenuation involving the adjacent liver and gallbladder fossa with mild peripheral enhancement concerning for adjacent multifocal intrahepatic abscesses. A gallstone is identified. The common duct is normal. Otherwise, no enhancing mass of the liver is seen. Pancreas: No pancreatic ductal dilatation, enhancing mass or inflammation. Spleen: Normal size spleen. Adrenals/Urinary Tract: Normal bilateral adrenal glands. Symmetric enhancement of the kidneys with small cyst arising off the interpolar right kidney measuring 13 mm. No hydroureteronephrosis. The urinary bladder is unremarkable. Stomach/Bowel: Small hiatal hernia. Decompressed stomach with normal small bowel rotation. No small-bowel obstruction. The distal and terminal ileum are normal. Patient is status post appendectomy. Sympathetic thickening of the colon traversing the right upper quadrant and mid upper abdomen is identified secondary to the inflamed gallbladder. Otherwise, there is scattered colonic diverticulosis along the descending colon with greater degree of sigmoid diverticulosis and circular muscle hypertrophy identified. Vascular/Lymphatic: Aortoiliac atherosclerosis. No pathologically enlarged lymph nodes. Small subcentimeter short axis periportal lymph nodes likely reactive in  etiology are noted. Reproductive: The uterus and adnexa are unremarkable. Other: No free air free fluid. Musculoskeletal: No acute nor aggressive osseous abnormality. Degenerative changes are present along the dorsal spine. IMPRESSION: Acute cholecystitis with markedly distended gallbladder, transmural thickening and pericholecystic inflammation identified. Complicating this finding are adjacent intrahepatic and pericholecystic fluid attenuating hypodensities with slight peripheral enhancement compatible with adjacent abscesses some which appear to be intrahepatic subjacent to the gallbladder fossa. 13 mm cyst arising from the interpolar right without worrisome features. Colonic diverticulosis without acute diverticulitis. Electronically Signed   By: Ashley Royalty M.D.   On: 09/12/2017 17:23   US Abdomen Limited Ruq  Result Date: 09/13/2017 CLINICAL DATA:  57 year old female with right upper quadrant abdominal pain x3 days. EXAM: ULTRASOUND ABDOMEN LIMITED RIGHT UPPER QUADRANT COMPARISON:  CT of the abdomen pelvis dated 09/12/2017 FINDINGS: Gallbladder: The gallbladder is distended. There gallbladder wall is irregular with multiple outpouchings. The gallbladder wall is thickened and edematous measuring 12 mm in thickness. There is sludge within the gallbladder. There is a small amount of pericholecystic fluid. Positive sonographic Murphy's sign. There is a 7.5 x 4.8 x 6.2 cm complex area adjacent to the gallbladder which may represent an abscess or a contained perforation. Common bile duct: Diameter: 4 mm Liver: There is diffuse increased liver echogenicity most consistent with fatty infiltration. Portal vein is patent on color Doppler imaging with normal direction of blood flow towards the liver. IMPRESSION: 1. Gallbladder sludge with findings of acute cholecystitis. Complex collection adjacent to the gallbladder may represent abscess or focally contained perforation. 2. Fatty liver. 3. Patent main portal vein  with hepatopetal flow. Electronically Signed   By: Anner Crete M.D.   On: 09/13/2017 00:12    Labs:  CBC: Recent Labs    06/02/17  1517 06/26/17 0751 09/12/17 1510 09/13/17 0413  WBC 8.7 6.4 21.9* 22.6*  HGB 11.8* 11.6* 9.6* 9.0*  HCT 37.8 38.6 30.7* 29.8*  PLT 334.0 243 349 419*    COAGS: Recent Labs    05/24/17 1116 05/24/17 1836 05/25/17 0507 06/26/17 0751 09/13/17 0413  INR  --   --   --  0.93  --   APTT 138* 147* 103*  --  76*    BMP: Recent Labs    05/24/17 0344 06/02/17 1517 06/26/17 0751 09/12/17 1510 09/13/17 0413  NA 139 138 139 138 140  K 4.6 4.6 4.0 3.8 3.9  CL 109 106 107 105 105  CO2 21* 24 22 24 25   GLUCOSE 120* 105* 131* 129* 137*  BUN 12 19 20 11 14   CALCIUM 8.5* 9.5 9.3 8.1* 8.4*  CREATININE 1.04* 1.14 1.38* 1.19* 1.43*  GFRNONAA 59*  --  42* 50* 40*  GFRAA >60  --  49* 58* 46*    LIVER FUNCTION TESTS: Recent Labs    05/23/17 0910 09/12/17 1510 09/13/17 0413  BILITOT 0.5 3.3* 4.0*  AST 27 148* 127*  ALT 19 94* 95*  ALKPHOS 105 217* 215*  PROT 7.2 7.1 7.3  ALBUMIN 3.0* 2.3* 2.4*    TUMOR MARKERS: No results for input(s): AFPTM, CEA, CA199, CHROMGRNA in the last 8760 hours.  Assessment and Plan: Acute cholecystitis For perc chole Last dose of Xarelto was 8/29 pm, bleeding risk low. Labs reviewed. Risks and benefits discussed with the patient including, but not limited to bleeding, infection, gallbladder perforation, bile leak, sepsis or even death.  All of the patient's questions were answered, patient is agreeable to proceed. Consent signed and in chart.    Thank you for this interesting consult.  I greatly enjoyed meeting [name] and look forward to participating in their care.  A copy of this report was sent to the requesting provider on this date.  Electronically Signed: Ascencion Dike, PA-C 09/13/2017, 10:34 AM   I spent a total of 25 minutes in face to face in clinical consultation, greater  than 50% of which was counseling/coordinating care for perc chole drain

## 2017-09-13 NOTE — Progress Notes (Signed)
Pt down to IR in stable condition. No needs at time of transfer to department.

## 2017-09-13 NOTE — Progress Notes (Signed)
[name] had just returned from her procedure when I arrived. She said she was feeling much better. She had two guests and another to arrive during our visit. I allowed her to visit w/friends and offered additional support if needed. Please page if needed. [place], MDiv   09/13/17 1400  Clinical Encounter Type  Visited With Patient and family together

## 2017-09-13 NOTE — Progress Notes (Signed)
Pharmacy Antibiotic Note  [name] is a 57 y.o. female admitted on 09/12/2017 with cholecystitis.  Pharmacy has been consulted for vancomycin and Zosyn for sepsis.  Plan: 1) Due to worsening SCr, change vanc from 1250mg  IV q24 to 1g IV q24 - goal AUC 400-500 2) Continue current Zosyn dosing 3) Daily SCr  Height: 5\' 7"  (170.2 cm) Weight: 264 lb 8.8 oz (120 kg) IBW/kg (Calculated) : 61.6  Temp (24hrs), Avg:99.1 F (37.3 C), Min:98.3 F (36.8 C), Max:100.5 F (38.1 C)  Recent Labs  Lab 09/12/17 1510 09/12/17 1524 09/12/17 1814 09/13/17 0413  WBC 21.9*  --   --  22.6*  CREATININE 1.19*  --   --  1.43*  LATICACIDVEN  --  1.05 0.68  --     Estimated Creatinine Clearance: 58.2 mL/min (A) (by C-G formula based on SCr of 1.43 mg/dL (H)).    Allergies  Allergen Reactions  . Belsomra [Suvorexant] Other (See Comments)    [place] out of bed while asleep: "I'm waking up as I'm falling on the floor;" "Night terrors"  . Enalapril Maleate Cough  . Latex Hives  . Nickel Other (See Comments)    Blisters  Pt has a titanium right and left knee - nickel causes skin irritations that form into blisters and sores  . Other Other (See Comments)    Patient has Sarcoidosis and can't tolerate any metals  . Iodine Other (See Comments)    Patient received IV contrast without pre-meds without any problems 09/12/17 kwl Mother was intolerant--CODED on CT table---pt never tried  . Doxycycline Diarrhea and Nausea And Vomiting  . Hydrochlorothiazide Other (See Comments)    Low potassium levels   . Hydroxychloroquine Sulfate Other (See Comments)    Vision changes   . Lyrica [Pregabalin] Other (See Comments)    "Made me feel high"  . Tape Other (See Comments)    Medical tape causes bruising    Thank you for allowing pharmacy to be a part of this patient's care.   Adrian Saran, PharmD, BCPS Pager [phone] 09/13/2017 11:32 AM

## 2017-09-13 NOTE — Progress Notes (Signed)
ANTICOAGULATION CONSULT NOTE - Follow Up  Pharmacy Consult for IV heparin Indication: History of DVT - bridge rivaroxaban  Allergies  Allergen Reactions  . Belsomra [Suvorexant] Other (See Comments)    [place] out of bed while asleep: "I'm waking up as I'm falling on the floor;" "Night terrors"  . Enalapril Maleate Cough  . Latex Hives  . Nickel Other (See Comments)    Blisters  Pt has a titanium right and left knee - nickel causes skin irritations that form into blisters and sores  . Other Other (See Comments)    Patient has Sarcoidosis and can't tolerate any metals  . Iodine Other (See Comments)    Patient received IV contrast without pre-meds without any problems 09/12/17 kwl Mother was intolerant--CODED on CT table---pt never tried  . Doxycycline Diarrhea and Nausea And Vomiting  . Hydrochlorothiazide Other (See Comments)    Low potassium levels   . Hydroxychloroquine Sulfate Other (See Comments)    Vision changes   . Lyrica [Pregabalin] Other (See Comments)    "Made me feel high"  . Tape Other (See Comments)    Medical tape causes bruising    Patient Measurements: Height: 5\' 7"  (170.2 cm) Weight: 264 lb 8.8 oz (120 kg) IBW/kg (Calculated) : 61.6 Heparin Dosing Weight: 92 kg   Vital Signs: Temp: 99.8 F (37.7 C) (08/31 0523) Temp Source: Oral (08/31 0523) BP: 103/60 (08/31 1400) Pulse Rate: 84 (08/31 1400)  Labs: Recent Labs    09/12/17 1510 09/13/17 0413 09/13/17 1211  HGB 9.6* 9.0*  --   HCT 30.7* 29.8*  --   PLT 349 419*  --   APTT  --  76* 75*  HEPARINUNFRC  --  >2.20* >2.20*  CREATININE 1.19* 1.43*  --     Estimated Creatinine Clearance: 58.2 mL/min (A) (by C-G formula based on SCr of 1.43 mg/dL (H)).   Medical History: Past Medical History:  Diagnosis Date  . Allergic rhinitis   . ALLERGIC RHINITIS 10/26/2009  . Anxiety   . ANXIETY 08/14/2006  . B12 DEFICIENCY 08/25/2007  . Complication of anesthesia    pt has had difficulty following anesthesia  with her knee in 2016-unable to care for herself afterward  . Confusion   . Depression    takes Lexapro daily  . Diabetes mellitus without complication ([place])    was on insulin but has been off since Nov 2015 and now only takes Metformin daily  . DYSPNEA 04/28/2009   with exertion  . Esophageal reflux    takes Nexium daily  . Fibromyalgia   . Headache    last migraine 2-27yrs ago;takes Topamax daily  . History of shingles   . Hypertension    takes Coreg daily  . Insomnia    takes Nortriptyline nightly   . Joint pain   . Joint swelling   . Left knee pain   . Lichen planus   . Long-term memory loss   . Nocturia   . OSA (obstructive sleep apnea)    doesn't use CPAP;sleep study in epic from 2006  . Osteoarthritis   . Osteoarthritis   . Pneumonia    over 30 yrs ago  . Protein calorie malnutrition ([place])   . Rheumatoid arthritis ([place])   . Sarcoidosis    Dr. Lolita Patella  . Short-term memory loss   . Shortness of breath   . TIA (transient ischemic attack)   . Unsteady gait   . Urinary urgency   . Vitamin D deficiency  is supposed to take Vit D but can't afford it  . VITAMIN D DEFICIENCY 08/25/2007    Medications:  Scheduled:  . feeding supplement  1 Container Oral TID BM  . fentaNYL      . insulin aspart  0-15 Units Subcutaneous TID WC  . insulin aspart  0-5 Units Subcutaneous QHS  . iopamidol      . lidocaine      . midazolam      . mometasone-formoterol  2 puff Inhalation BID  . mupirocin ointment  1 application Nasal BID    Assessment: Pharmacy is consulted to start heparin drip in 57 yo female with PMH of DVT. Pt is on Xarelto 20 mg PO daily. Pt is being started on heparin as pt is also being evaluated for cholecystitis  Per med rec last dose of Xarelto was on 8/29 at [facility].  Of note, no weight has been entered while on this visit to ED. Dosing is based on a previous entry from 08/27/17 that had the pt's listed weight as 127 kg.   Today, 09/13/17   Per RN, IV  heparin turned off at 1100 for IR to place a perc drain  However, aPTT/heparin level was drawn ~1200 despite the IV heparin not even running. The aPTT however was still therapeutic despite the heparin being off approximately an hour before the lab was drawn. The heparin level was expected to be elevated due to patient's Xarelto they were taking prior to admission  No reported bleeding per RN prior to patient going to IR for procedure  Hgb 9 this AM, down some from yesterday - monitor closely   Goal of Therapy:  Heparin level 0.3-0.7 units/ml Monitor platelets by anticoagulation protocol: Yes   Plan:  1) Once patient returns from IR, restart IV heparin at previous rate of 1600 units/hr when IR deems safe to do so since noon aPTT is unreliable 2) With pt recently taking Xarelto, will continue to obtain HL level as well as aPTT levels untill the aPTT levels and HL correlate 3) Will recheck heparin level 6 hours after restarting the IV heparin   4) Monitor for signs and symptoms of bleeding   Adrian Saran, PharmD, BCPS Pager [phone] 09/13/2017 2:05 PM

## 2017-09-13 NOTE — Progress Notes (Signed)
ANTICOAGULATION CONSULT NOTE - Follow Up Consult  Pharmacy Consult for Heparin Indication: DVT--Bridge rivaroxaban  Allergies  Allergen Reactions  . Belsomra [Suvorexant] Other (See Comments)    [place] out of bed while asleep: "I'm waking up as I'm falling on the floor;" "Night terrors"  . Enalapril Maleate Cough  . Latex Hives  . Nickel Other (See Comments)    Blisters  Pt has a titanium right and left knee - nickel causes skin irritations that form into blisters and sores  . Other Other (See Comments)    Patient has Sarcoidosis and can't tolerate any metals  . Iodine Other (See Comments)    Patient received IV contrast without pre-meds without any problems 09/12/17 kwl Mother was intolerant--CODED on CT table---pt never tried  . Doxycycline Diarrhea and Nausea And Vomiting  . Hydrochlorothiazide Other (See Comments)    Low potassium levels   . Hydroxychloroquine Sulfate Other (See Comments)    Vision changes   . Lyrica [Pregabalin] Other (See Comments)    "Made me feel high"  . Tape Other (See Comments)    Medical tape causes bruising    Patient Measurements: Height: 5\' 7"  (170.2 cm) Weight: 264 lb 8.8 oz (120 kg) IBW/kg (Calculated) : 61.6 Heparin Dosing Weight:   Vital Signs: Temp: 99.8 F (37.7 C) (08/31 0523) Temp Source: Oral (08/31 0523) BP: 110/60 (08/31 0523) Pulse Rate: 87 (08/31 0523)  Labs: Recent Labs    09/12/17 1510 09/13/17 0413  HGB 9.6* 9.0*  HCT 30.7* 29.8*  PLT 349 419*  APTT  --  76*  CREATININE 1.19* 1.43*    Estimated Creatinine Clearance: 58.2 mL/min (A) (by C-G formula based on SCr of 1.43 mg/dL (H)).   Medications:  Medications Prior to Admission  Medication Sig Dispense Refill Last Dose  . Adalimumab (HUMIRA) 40 MG/0.8ML PSKT Inject 40 mg into the skin every 7 (seven) days.    Past Month at Unknown time  . budesonide-formoterol (SYMBICORT) 160-4.5 MCG/ACT inhaler Inhale 2 puffs into the lungs TWICE DAILY 120/4=30 10.2 g 5 Past  Month at Unknown time  . carvedilol (COREG) 12.5 MG tablet Take 1 tablet (12.5 mg total) by mouth 2 (two) times daily with a meal. 180 tablet 3 over 1 week at Unknown time  . gabapentin (NEURONTIN) 300 MG capsule Take 1 capsule (300 mg total) by mouth 2 (two) times daily as needed (for pain). 180 capsule 1 Past Week at Unknown time  . potassium chloride (K-DUR,KLOR-CON) 10 MEQ tablet Take 10 mEq by mouth daily.   3 over 1 week at Unknown time  . XARELTO 20 MG TABS tablet Take 1 tablet (20 mg total) by mouth at bedtime. 90 tablet 3 09/11/2017 at 1930  . clonazePAM (KLONOPIN) 0.5 MG tablet Take 1-2 tablets (0.5-1 mg total) by mouth at bedtime. 60 tablet 3 over 1 week at unknown time  . esomeprazole (NEXIUM) 40 MG capsule Take 1 capsule (40 mg total) by mouth daily before breakfast. 90 capsule 1 over 1 week at unknown time  . furosemide (LASIX) 40 MG tablet TAKE 1 TABLET BY MOUTH EVERY MORNING FOR EDEMA (Patient taking differently: Take 20 mg by mouth daily as needed for fluid or edema. ) 90 tablet 1 unknown  . glucose blood (EZ SMART PLUS GLUCOSE TEST) test strip Use as instructed to test sugars 4 times daily DX: E11.8 400 each 1   . JANUVIA 50 MG tablet Take 1 tablet (50 mg total) by mouth daily. (Patient taking differently: Take  50 mg by mouth daily. ) 90 tablet 0 over 1 week at unknown time  . Lancets (ACCU-CHEK MULTICLIX) lancets Use as instructed to check sugars 4 times daily DX: E11.8 400 each 0 Taking  . metFORMIN (GLUCOPHAGE-XR) 500 MG 24 hr tablet TAKE 2 TABLETS(1000 MG) BY MOUTH DAILY WITH BREAKFAST 180 tablet 1 over 1 week at unknown time  . promethazine (PHENERGAN) 25 MG tablet Take 1 tablet (25 mg total) by mouth every 8 (eight) hours as needed for nausea or vomiting. 20 tablet 0 unknown  . topiramate (TOPAMAX) 50 MG tablet Take 1 tablet (50 mg total) by mouth 2 (two) times daily. 180 tablet 1 over 1 week at unknown time  . venlafaxine XR (EFFEXOR-XR) 75 MG 24 hr capsule Take 1 capsule (75 mg  total) by mouth daily with breakfast. 30 capsule 11 over 1 week at unknown time  . VENTOLIN HFA 108 (90 Base) MCG/ACT inhaler Inhale 2 puffs into the lungs every 6 (six) hours as needed for wheezing or shortness of breath. 200/8=25 (Patient taking differently: Inhale 2 puffs into the lungs every 6 (six) hours as needed for wheezing or shortness of breath. ) 18 g 11 unknown  . VOLTAREN 1 % GEL Apply 2 g topically 2 (two) times daily as needed (for pain). 100/4=25 (Patient taking differently: Apply 4 g topically 4 (four) times daily as needed (pain). ) 100 g 2 unknown   Infusions:  . sodium chloride Stopped (09/13/17 0205)  . sodium chloride Stopped (09/12/17 2152)  . heparin 1,600 Units/hr (09/13/17 0600)  . piperacillin-tazobactam (ZOSYN)  IV 12.5 mL/hr at 09/13/17 0600  . vancomycin      Assessment: Patient with PTT at goal and heparin level being re-run by lab.  No heparin issues per RN.  PTT ordered with Heparin level until both correlate due to possible drug-lab interaction between oral anticoagulant (rivaroxaban, edoxaban, or apixaban) and anti-Xa level (aka heparin level)   Goal of Therapy:  Heparin level 0.3-0.7 units/ml aPTT 66-102 seconds Monitor platelets by anticoagulation protocol: Yes   Plan:  Continue heparin drip at current rate Recheck heparin level / aPTT at [address] [place] 09/13/2017,6:43 AM

## 2017-09-13 NOTE — Plan of Care (Signed)
Plan of care discussed.   

## 2017-09-13 NOTE — Plan of Care (Signed)
Pt stable though anxious and having pain overall. RN medicating for pain. [facility] came to consult and said pt was not appropriate for surgery due to lung issues and comorbities. Pt is not happy with this plan is concerned. Interventional Radiology to be consulted. No changes needed to care plans. Will continue to monitor

## 2017-09-13 NOTE — Progress Notes (Signed)
Heparin gtt turned off per Interventional Radiology order. Pt will go down at 1300 for drain placement.

## 2017-09-13 NOTE — Consult Note (Signed)
Reason for Consult: cholecystitis Referring Physician: Dr [name] is an 57 y.o. female.  HPI: Pt admitted through the ED last night for RUQ pain. Her past medical history is significant of CAD, hypertension, diabetes, depression, DVT/PE on Xarelto, OSA, COPD on 2 L by nasal cannula and sarcoidosis on Humira who presented with 2 weeks of nausea, vomiting, diarrhea and abdominal pain.  She had interval improvement in his symptoms until 2 to 3 days ago when they again worsened.  Pain was over epigastric area with radiation to her back.  She describes the pain as sharp but intermittent.  Last dose on Xarelto was at 7:30 PM Thursday.   Past Medical History:  Diagnosis Date  . Allergic rhinitis   . ALLERGIC RHINITIS 10/26/2009  . Anxiety   . ANXIETY 08/14/2006  . B12 DEFICIENCY 08/25/2007  . Complication of anesthesia    pt has had difficulty following anesthesia with her knee in 2016-unable to care for herself afterward  . Confusion   . Depression    takes Lexapro daily  . Diabetes mellitus without complication ([place])    was on insulin but has been off since Nov 2015 and now only takes Metformin daily  . DYSPNEA 04/28/2009   with exertion  . Esophageal reflux    takes Nexium daily  . Fibromyalgia   . Headache    last migraine 2-92yr ago;takes Topamax daily  . History of shingles   . Hypertension    takes Coreg daily  . Insomnia    takes Nortriptyline nightly   . Joint pain   . Joint swelling   . Left knee pain   . Lichen planus   . Long-term memory loss   . Nocturia   . OSA (obstructive sleep apnea)    doesn't use CPAP;sleep study in epic from 2006  . Osteoarthritis   . Osteoarthritis   . Pneumonia    over 30 yrs ago  . Protein calorie malnutrition ([place]   . Rheumatoid arthritis ([place]   . Sarcoidosis    Dr. ZLolita Garrison . Short-term memory loss   . Shortness of breath   . TIA (transient ischemic attack)   . Unsteady gait   . Urinary urgency   .  Vitamin D deficiency    is supposed to take Vit D but can't afford it  . VITAMIN D DEFICIENCY 08/25/2007    Past Surgical History:  Procedure Laterality Date  . APPENDECTOMY    . arthroscopic knee surgery Right 11-12-04  . AXILLARY ABCESS IRRIGATION AND DEBRIDEMENT  Jul & Aug2012  . CARPAL TUNNEL RELEASE Left 05/23/2014   Procedure: CARPAL TUNNEL RELEASE;  Surgeon: SMeredith Pel MD;  [facility]  Service: Orthopedics;  Laterality: Left;  . cyst removed from top of buttocks  at age 89 . ENDOMETRIAL ABLATION    . LACRIMAL DUCT EXPLORATION Right 06/26/2017   Procedure: LACRIMAL DUCT EXPLORATION AND ETHMOIDECTOMY;  Surgeon: AClista Bernhardt MD;  [facility]  Service: Ophthalmology;  Laterality: Right;  . TEAR DUCT PROBING Right 06/26/2017   Procedure: TEAR DUCT PROBING WITH STENT;  Surgeon: AClista Bernhardt MD;  [facility]  Service: Ophthalmology;  Laterality: Right;  . TOTAL KNEE ARTHROPLASTY Right 11/15/2014   Procedure: TOTAL RIGHT KNEE ARTHROPLASTY;  Surgeon: SMeredith Pel MD;  [facility]  Service: Orthopedics;  Laterality: Right;  . TOTAL KNEE ARTHROPLASTY Left 07/13/2015   Procedure: LEFT TOTAL KNEE ARTHROPLASTY;  Surgeon: SMeredith Pel  MD;  Location: [facility];  Service: Orthopedics;  Laterality: Left;    Family History  Problem Relation Age of Onset  . Heart disease Mother   . Kidney disease Mother   . Cancer Father        leukemia  . Hypertension Unknown   . Coronary artery disease Unknown        female 1st degree relative <60  . Heart failure Unknown        congestive  . Coronary artery disease Unknown        Female 1st degree relative <50  . Breast cancer Unknown        1st degree relative <50 S  . Breast cancer Sister     Social History:  reports that she has quit smoking. Her smoking use included cigarettes. She has a 5.00 pack-year smoking history. She has never used smokeless tobacco. She reports that she does not drink alcohol or  use drugs.  Allergies:  Allergies  Allergen Reactions  . Belsomra [Suvorexant] Other (See Comments)    [place] out of bed while asleep: "I'm waking up as I'm falling on the floor;" "Night terrors"  . Enalapril Maleate Cough  . Latex Hives  . Nickel Other (See Comments)    Blisters  Pt has a titanium right and left knee - nickel causes skin irritations that form into blisters and sores  . Other Other (See Comments)    Patient has Sarcoidosis and can't tolerate any metals  . Iodine Other (See Comments)    Patient received IV contrast without pre-meds without any problems 09/12/17 kwl Mother was intolerant--CODED on CT table---pt never tried  . Doxycycline Diarrhea and Nausea And Vomiting  . Hydrochlorothiazide Other (See Comments)    Low potassium levels   . Hydroxychloroquine Sulfate Other (See Comments)    Vision changes   . Lyrica [Pregabalin] Other (See Comments)    "Made me feel high"  . Tape Other (See Comments)    Medical tape causes bruising    Medications: I have reviewed the patient's current medications.  Results for orders placed or performed during the hospital encounter of 09/12/17 (from the past 48 hour(s))  CBC with Differential     Status: Abnormal   Collection Time: 09/12/17  3:10 PM  Result Value Ref Range   WBC 21.9 (H) 4.0 - 10.5 K/uL   RBC 3.38 (L) 3.87 - 5.11 MIL/uL   Hemoglobin 9.6 (L) 12.0 - 15.0 g/dL   HCT 30.7 (L) 36.0 - 46.0 %   MCV 90.8 78.0 - 100.0 fL   MCH 28.4 26.0 - 34.0 pg   MCHC 31.3 30.0 - 36.0 g/dL   RDW 15.5 11.5 - 15.5 %   Platelets 349 150 - 400 K/uL   Neutrophils Relative % 86 %   Neutro Abs 19.0 (H) 1.7 - 7.7 K/uL   Lymphocytes Relative 9 %   Lymphs Abs 1.9 0.7 - 4.0 K/uL   Monocytes Relative 5 %   Monocytes Absolute 1.0 0.1 - 1.0 K/uL   Eosinophils Relative 0 %   Eosinophils Absolute 0.1 0.0 - 0.7 K/uL   Basophils Relative 0 %   Basophils Absolute 0.0 0.0 - 0.1 K/uL    Comment: Performed at [facility], [place] [address] [place], [place] [address]  Comprehensive metabolic panel     Status: Abnormal   Collection Time: 09/12/17  3:10 PM  Result Value Ref Range   Sodium 138 135 - 145 mmol/L  Potassium 3.8 3.5 - 5.1 mmol/L   Chloride 105 98 - 111 mmol/L   CO2 24 22 - 32 mmol/L   Glucose, Bld 129 (H) 70 - 99 mg/dL   BUN 11 6 - 20 mg/dL   Creatinine, Ser 1.19 (H) 0.44 - 1.00 mg/dL   Calcium 8.1 (L) 8.9 - 10.3 mg/dL   Total Protein 7.1 6.5 - 8.1 g/dL   Albumin 2.3 (L) 3.5 - 5.0 g/dL   AST 148 (H) 15 - 41 U/L   ALT 94 (H) 0 - 44 U/L   Alkaline Phosphatase 217 (H) 38 - 126 U/L   Total Bilirubin 3.3 (H) 0.3 - 1.2 mg/dL   GFR calc non Af Amer 50 (L) >60 mL/min   GFR calc Af Amer 58 (L) >60 mL/min    Comment: (NOTE) The eGFR has been calculated using the CKD EPI equation. This calculation has not been validated in all clinical situations. eGFR's persistently <60 mL/min signify possible Chronic Kidney Disease.    Anion gap 9 5 - 15    Comment: Performed at [facility], [place] [address], [place], [place] [address]  Lipase, blood     Status: None   Collection Time: 09/12/17  3:10 PM  Result Value Ref Range   Lipase 34 11 - 51 U/L    Comment: Performed at [facility], [place] [address], [place], [place] [address]  I-Stat CG4 Lactic Acid, ED     Status: None   Collection Time: 09/12/17  3:24 PM  Result Value Ref Range   Lactic Acid, Venous 1.05 0.5 - 1.9 mmol/L  Urinalysis, Routine w reflex microscopic     Status: Abnormal   Collection Time: 09/12/17  6:07 PM  Result Value Ref Range   Color, Urine AMBER (A) YELLOW    Comment: BIOCHEMICALS MAY BE AFFECTED BY COLOR   APPearance CLEAR CLEAR   Specific Gravity, Urine 1.044 (H) 1.005 - 1.030   pH 6.0 5.0 - 8.0   Glucose, UA NEGATIVE NEGATIVE mg/dL   Hgb urine dipstick MODERATE (A) NEGATIVE   Bilirubin Urine SMALL (A) NEGATIVE   Ketones, ur NEGATIVE NEGATIVE mg/dL   Protein, ur NEGATIVE NEGATIVE mg/dL    Nitrite NEGATIVE NEGATIVE   Leukocytes, UA NEGATIVE NEGATIVE   RBC / HPF 6-10 0 - 5 RBC/hpf   WBC, UA 0-5 0 - 5 WBC/hpf   Bacteria, UA NONE SEEN NONE SEEN   Squamous Epithelial / LPF 0-5 0 - 5    Comment: Performed at [facility], [place] [address] [place],  [address]  I-Stat CG4 Lactic Acid, ED     Status: None   Collection Time: 09/12/17  6:14 PM  Result Value Ref Range   Lactic Acid, Venous 0.68 0.5 - 1.9 mmol/L  Brain natriuretic peptide     Status: Abnormal   Collection Time: 09/12/17  7:00 PM  Result Value Ref Range   B Natriuretic Peptide 204.1 (H) 0.0 - 100.0 pg/mL    Comment: Performed at [facility], [place] [address], [place], [place] [address]  Glucose, capillary     Status: Abnormal   Collection Time: 09/12/17 10:00 PM  Result Value Ref Range   Glucose-Capillary 104 (H) 70 - 99 mg/dL   Comment 1 Notify RN    Comment 2 Document in Chart   Surgical PCR screen     Status: Abnormal   Collection Time: 09/13/17  1:47 AM  Result Value Ref Range  MRSA, PCR NEGATIVE NEGATIVE   Staphylococcus aureus POSITIVE (A) NEGATIVE    Comment: (NOTE) The Xpert SA Assay (FDA approved for NASAL specimens in patients 92 years of age and older), is one component of a comprehensive surveillance program. It is not intended to diagnose infection nor to guide or monitor treatment. Performed at [facility], [place] [address] [place], [place] [address]   Heparin level (unfractionated)     Status: Abnormal   Collection Time: 09/13/17  4:13 AM  Result Value Ref Range   Heparin Unfractionated 1.91 (H) 0.30 - 0.70 IU/mL    Comment: RESULTS CONFIRMED BY MANUAL DILUTION (NOTE) If heparin results are below expected values, and patient dosage has  been confirmed, suggest follow up testing of antithrombin III levels. Performed at [facility], [place] [address] [place], [place] [address]   APTT     Status: Abnormal    Collection Time: 09/13/17  4:13 AM  Result Value Ref Range   aPTT 76 (H) 24 - 36 seconds    Comment:        IF BASELINE aPTT IS ELEVATED, SUGGEST PATIENT RISK ASSESSMENT BE USED TO DETERMINE APPROPRIATE ANTICOAGULANT THERAPY. Performed at [facility], [place] [address], [place], [place] [address]   Comprehensive metabolic panel     Status: Abnormal   Collection Time: 09/13/17  4:13 AM  Result Value Ref Range   Sodium 140 135 - 145 mmol/L   Potassium 3.9 3.5 - 5.1 mmol/L   Chloride 105 98 - 111 mmol/L   CO2 25 22 - 32 mmol/L   Glucose, Bld 137 (H) 70 - 99 mg/dL   BUN 14 6 - 20 mg/dL   Creatinine, Ser 1.43 (H) 0.44 - 1.00 mg/dL   Calcium 8.4 (L) 8.9 - 10.3 mg/dL   Total Protein 7.3 6.5 - 8.1 g/dL   Albumin 2.4 (L) 3.5 - 5.0 g/dL   AST 127 (H) 15 - 41 U/L   ALT 95 (H) 0 - 44 U/L   Alkaline Phosphatase 215 (H) 38 - 126 U/L   Total Bilirubin 4.0 (H) 0.3 - 1.2 mg/dL   GFR calc non Af Amer 40 (L) >60 mL/min   GFR calc Af Amer 46 (L) >60 mL/min    Comment: (NOTE) The eGFR has been calculated using the CKD EPI equation. This calculation has not been validated in all clinical situations. eGFR's persistently <60 mL/min signify possible Chronic Kidney Disease.    Anion gap 10 5 - 15    Comment: Performed at [facility], [place] [address], [place], [place] [address]  CBC     Status: Abnormal   Collection Time: 09/13/17  4:13 AM  Result Value Ref Range   WBC 22.6 (H) 4.0 - 10.5 K/uL   RBC 3.21 (L) 3.87 - 5.11 MIL/uL   Hemoglobin 9.0 (L) 12.0 - 15.0 g/dL   HCT 29.8 (L) 36.0 - 46.0 %   MCV 92.8 78.0 - 100.0 fL   MCH 28.0 26.0 - 34.0 pg   MCHC 30.2 30.0 - 36.0 g/dL   RDW 15.8 (H) 11.5 - 15.5 %   Platelets 419 (H) 150 - 400 K/uL    Comment: Performed at [facility], [place] [address] [place],  [address]  Glucose, capillary     Status: Abnormal   Collection Time: 09/13/17  7:41 AM  Result Value Ref Range   Glucose-Capillary  117 (H) 70 - 99 mg/dL    Dg  Chest 2 View  Result Date: 09/12/2017 CLINICAL DATA:  Productive cough and chills EXAM: CHEST - 2 VIEW COMPARISON:  06/02/2017 FINDINGS: Cardiac enlargement with mild vascular congestion. Negative for edema or effusion. Negative for pneumonia IMPRESSION: Cardiac enlargement and mild vascular congestion. Negative for pneumonia or edema. Electronically Signed   By: Franchot Gallo M.D.   On: 09/12/2017 18:01   Ct Abdomen Pelvis W Contrast  Result Date: 09/12/2017 CLINICAL DATA:  Acute generalized abdominal pain x2 weeks with diarrhea and weight loss. EXAM: CT ABDOMEN AND PELVIS WITH CONTRAST TECHNIQUE: Multidetector CT imaging of the abdomen and pelvis was performed using the standard protocol following bolus administration of intravenous contrast. CONTRAST:  162m ISOVUE-300 IOPAMIDOL (ISOVUE-300) INJECTION 61% COMPARISON:  None. FINDINGS: Lower chest: Heart size is normal. Minimal atelectatic subpleural nodular density in the left lower lobe on axial series 6/11 most likely represents a small focus of atelectasis given its more band like appearance on coronal reformats. No effusion or pulmonary consolidation. Hepatobiliary: Distended gallbladder with transmural thickening and pericholecystic fluid consistent with acute cholecystitis is identified. Complicating this finding are areas of low attenuation involving the adjacent liver and gallbladder fossa with mild peripheral enhancement concerning for adjacent multifocal intrahepatic abscesses. A gallstone is identified. The common duct is normal. Otherwise, no enhancing mass of the liver is seen. Pancreas: No pancreatic ductal dilatation, enhancing mass or inflammation. Spleen: Normal size spleen. Adrenals/Urinary Tract: Normal bilateral adrenal glands. Symmetric enhancement of the kidneys with small cyst arising off the interpolar right kidney measuring 13 mm. No hydroureteronephrosis. The urinary bladder is unremarkable.  Stomach/Bowel: Small hiatal hernia. Decompressed stomach with normal small bowel rotation. No small-bowel obstruction. The distal and terminal ileum are normal. Patient is status post appendectomy. Sympathetic thickening of the colon traversing the right upper quadrant and mid upper abdomen is identified secondary to the inflamed gallbladder. Otherwise, there is scattered colonic diverticulosis along the descending colon with greater degree of sigmoid diverticulosis and circular muscle hypertrophy identified. Vascular/Lymphatic: Aortoiliac atherosclerosis. No pathologically enlarged lymph nodes. Small subcentimeter short axis periportal lymph nodes likely reactive in etiology are noted. Reproductive: The uterus and adnexa are unremarkable. Other: No free air free fluid. Musculoskeletal: No acute nor aggressive osseous abnormality. Degenerative changes are present along the dorsal spine. IMPRESSION: Acute cholecystitis with markedly distended gallbladder, transmural thickening and pericholecystic inflammation identified. Complicating this finding are adjacent intrahepatic and pericholecystic fluid attenuating hypodensities with slight peripheral enhancement compatible with adjacent abscesses some which appear to be intrahepatic subjacent to the gallbladder fossa. 13 mm cyst arising from the interpolar right without worrisome features. Colonic diverticulosis without acute diverticulitis. Electronically Signed   By: DAshley RoyaltyM.D.   On: 09/12/2017 17:23   [place]Abdomen Limited Ruq  Result Date: 09/13/2017 CLINICAL DATA:  57year old female with right upper quadrant abdominal pain x3 days. EXAM: ULTRASOUND ABDOMEN LIMITED RIGHT UPPER QUADRANT COMPARISON:  CT of the abdomen pelvis dated 09/12/2017 FINDINGS: Gallbladder: The gallbladder is distended. There gallbladder wall is irregular with multiple outpouchings. The gallbladder wall is thickened and edematous measuring 12 mm in thickness. There is sludge within the  gallbladder. There is a small amount of pericholecystic fluid. Positive sonographic Murphy's sign. There is a 7.5 x 4.8 x 6.2 cm complex area adjacent to the gallbladder which may represent an abscess or a contained perforation. Common bile duct: Diameter: 4 mm Liver: There is diffuse increased liver echogenicity most consistent with fatty infiltration. Portal vein is patent on color Doppler imaging with normal direction of  blood flow towards the liver. IMPRESSION: 1. Gallbladder sludge with findings of acute cholecystitis. Complex collection adjacent to the gallbladder may represent abscess or focally contained perforation. 2. Fatty liver. 3. Patent main portal vein with hepatopetal flow. Electronically Signed   By: Anner Crete M.D.   On: 09/13/2017 00:12    Review of Systems  Constitutional: Negative for chills and fever.  HENT: Negative for hearing loss.   Eyes: Negative for blurred vision.  Respiratory: Negative for cough and shortness of breath.   Cardiovascular: Negative for chest pain and palpitations.  Gastrointestinal: Positive for abdominal pain, diarrhea, nausea and vomiting.  Genitourinary: Negative for dysuria, frequency and urgency.  Skin: Negative for itching and rash.   Blood pressure 110/60, pulse 87, temperature 99.8 F (37.7 C), temperature source Oral, resp. rate 17, height 5' 7"  (1.702 m), weight 120 kg, SpO2 94 %. Physical Exam  Constitutional: She is oriented to person, place, and time. She appears well-developed and well-nourished.  HENT:  Head: Normocephalic and atraumatic.  Eyes: Pupils are equal, round, and reactive to light. Conjunctivae and EOM are normal.  Neck: Normal range of motion. Neck supple.  Cardiovascular: Normal rate and regular rhythm.  Respiratory: Effort normal. No respiratory distress.  GI: Soft. She exhibits no distension. There is tenderness (RUQ).  Musculoskeletal: Normal range of motion.  Neurological: She is alert and oriented to person,  place, and time.    Assessment/Plan: 57 y.o. F with sarcoidosis and cholecystitis with significant co morbidities and symptoms for > 2 weeks.  I do not think she is a good candidate for surgery at this time and maybe ever.  I have consulted IR for possible perc drain.  Please cont to hold anticoagulation.    [name] C. 06/15/6578, 0:63 AM

## 2017-09-13 NOTE — Progress Notes (Signed)
Patient ID: [name], female   DOB: 1960-05-21, 57 y.o.   MRN: [id number]  PROGRESS NOTE    [name]  EHM:[id number] DOB: 11-Oct-1960 DOA: 09/12/2017 PCP: Cassandria Anger, MD   Brief Narrative:  57 year old female with history of CAD, hypertension, diabetes, depression, DVT/PE on Xarelto, OSA, COPD on 2 L oxygen via nasal cannula and sarcoidosis on Humira presented with abdominal pain, nausea, vomiting and diarrhea for 2 weeks.  CT abdomen on admission showed acute cholecystitis with markedly distended gallbladder, adjacent intrahepatic and pericholecystic fluid concerning for abscess.  She was started on intravenous antibiotics and general surgery was consulted.   Assessment & Plan:   Principal Problem:   Cholecystitis Active Problems:   Major depressive disorder, recurrent episode ([place])   Essential hypertension   CAD in native artery   Controlled type 2 diabetes mellitus with chronic kidney disease, without long-term current use of insulin (HCC)   Pulmonary embolism (HCC)   OSA (obstructive sleep apnea)   Diastolic dysfunction   COPD (chronic obstructive pulmonary disease) ([place])   Sepsis ([place])   Sepsis secondary to cholecystitis -Antibiotic plan as below.  Follow cultures.  Continue IV fluids.  Currently hemodynamically stable.  Acute cholecystitis with possible intrahepatic and pericholecystic abscess with elevated LFTs -General surgery has been consulted.  Patient might need IR guided drainage.  Continue broad-spectrum antibiotics.  Follow cultures.  Pain management.  N.p.o.  Leukocytosis -Probably secondary to above.  Repeat a.m. labs  History of DVT/PE -Xarelto on hold.  Currently on heparin drip per pharmacy  COPD with chronic hypoxic respiratory failure -Currently stable.  Continue Dulera.  Continue as needed duo nebs.  Continue oxygen supplementation  History of coronary artery disease -Currently no chest pain.  Ordered statin or  aspirin.  Coreg on hold because of sepsis and hypotension  Diabetes mellitus type 2 -Oral meds on hold -Continue CBGs with sliding scale insulin  Chronic diastolic heart failure -Currently no signs of volume overload.  Lasix on hold because of hypotension.  Strict input and output.  Daily weights  OSA -Does not use CPAP  Morbid obesity -Outpatient follow-up  Depression -Continue Effexor once able to take orally   DVT prophylaxis: Heparin Code Status: Full Family Communication: None at bedside Disposition Plan: Depends on clinical outcome  Consultants: General surgery  Procedures: None  Antimicrobials: Zosyn from 09/12/2017 onwards.  1 dose of vancomycin on 09/12/2017   Subjective: Patient seen and examined at bedside.  She denies any worsening abdominal pain or current nausea.  No current chest pain or worsening shortness of breath.  Objective: Vitals:   09/12/17 2117 09/12/17 2153 09/13/17 0523 09/13/17 0934  BP:  (!) 88/44 110/60   Pulse:  68 87   Resp:  16 17   Temp:  98.3 F (36.8 C) 99.8 F (37.7 C)   TempSrc:  Oral Oral   SpO2:  99% 94% 93%  Weight: 120 kg     Height: 5\' 7"  (1.702 m)       Intake/Output Summary (Last 24 hours) at 09/13/2017 0946 Last data filed at 09/13/2017 0600 Gross per 24 hour  Intake 2608.64 ml  Output 200 ml  Net 2408.64 ml   Filed Weights   09/12/17 2117  Weight: 120 kg    Examination:  General exam: Appears older than stated age.  No distress Respiratory system: Bilateral decreased breath sounds at bases Cardiovascular system: S1 & S2 heard, Rate controlled Gastrointestinal system: Abdomen is slightly distended, soft, tender in  the right upper quadrant.  Normal bowel sounds heard. Extremities: No cyanosis, clubbing, edema  Central nervous system: Alert and oriented. No focal neurological deficits. Moving extremities Skin: No rashes, lesions or ulcers Psychiatry: Slightly anxious.    Data Reviewed: I have personally  reviewed following labs and imaging studies  CBC: Recent Labs  Lab 09/12/17 1510 09/13/17 0413  WBC 21.9* 22.6*  NEUTROABS 19.0*  --   HGB 9.6* 9.0*  HCT 30.7* 29.8*  MCV 90.8 92.8  PLT 349 169*   Basic Metabolic Panel: Recent Labs  Lab 09/12/17 1510 09/13/17 0413  NA 138 140  K 3.8 3.9  CL 105 105  CO2 24 25  GLUCOSE 129* 137*  BUN 11 14  CREATININE 1.19* 1.43*  CALCIUM 8.1* 8.4*   GFR: Estimated Creatinine Clearance: 58.2 mL/min (A) (by C-G formula based on SCr of 1.43 mg/dL (H)). Liver Function Tests: Recent Labs  Lab 09/12/17 1510 09/13/17 0413  AST 148* 127*  ALT 94* 95*  ALKPHOS 217* 215*  BILITOT 3.3* 4.0*  PROT 7.1 7.3  ALBUMIN 2.3* 2.4*   Recent Labs  Lab 09/12/17 1510  LIPASE 34   No results for input(s): AMMONIA in the last 168 hours. Coagulation Profile: No results for input(s): INR, PROTIME in the last 168 hours. Cardiac Enzymes: No results for input(s): CKTOTAL, CKMB, CKMBINDEX, TROPONINI in the last 168 hours. BNP (last 3 results) Recent Labs    11/11/16 1051  PROBNP 194.0*   HbA1C: No results for input(s): HGBA1C in the last 72 hours. CBG: Recent Labs  Lab 09/12/17 2200 09/13/17 0741  GLUCAP 104* 117*   Lipid Profile: No results for input(s): CHOL, HDL, LDLCALC, TRIG, CHOLHDL, LDLDIRECT in the last 72 hours. Thyroid Function Tests: No results for input(s): TSH, T4TOTAL, FREET4, T3FREE, THYROIDAB in the last 72 hours. Anemia Panel: No results for input(s): VITAMINB12, FOLATE, FERRITIN, TIBC, IRON, RETICCTPCT in the last 72 hours. Sepsis Labs: Recent Labs  Lab 09/12/17 1524 09/12/17 1814  LATICACIDVEN 1.05 0.68    Recent Results (from the past 240 hour(s))  Surgical PCR screen     Status: Abnormal   Collection Time: 09/13/17  1:47 AM  Result Value Ref Range Status   MRSA, PCR NEGATIVE NEGATIVE Final   Staphylococcus aureus POSITIVE (A) NEGATIVE Final    Comment: (NOTE) The Xpert SA Assay (FDA approved for NASAL  specimens in patients 72 years of age and older), is one component of a comprehensive surveillance program. It is not intended to diagnose infection nor to guide or monitor treatment. Performed at [facility], [place] [address] [place], [place] [address]          Radiology Studies: Dg Chest 2 View  Result Date: 09/12/2017 CLINICAL DATA:  Productive cough and chills EXAM: CHEST - 2 VIEW COMPARISON:  06/02/2017 FINDINGS: Cardiac enlargement with mild vascular congestion. Negative for edema or effusion. Negative for pneumonia IMPRESSION: Cardiac enlargement and mild vascular congestion. Negative for pneumonia or edema. Electronically Signed   By: Franchot Gallo M.D.   On: 09/12/2017 18:01   Ct Abdomen Pelvis W Contrast  Result Date: 09/12/2017 CLINICAL DATA:  Acute generalized abdominal pain x2 weeks with diarrhea and weight loss. EXAM: CT ABDOMEN AND PELVIS WITH CONTRAST TECHNIQUE: Multidetector CT imaging of the abdomen and pelvis was performed using the standard protocol following bolus administration of intravenous contrast. CONTRAST:  110mL ISOVUE-300 IOPAMIDOL (ISOVUE-300) INJECTION 61% COMPARISON:  None. FINDINGS: Lower chest: Heart size is normal. Minimal atelectatic subpleural nodular  density in the left lower lobe on axial series 6/11 most likely represents a small focus of atelectasis given its more band like appearance on coronal reformats. No effusion or pulmonary consolidation. Hepatobiliary: Distended gallbladder with transmural thickening and pericholecystic fluid consistent with acute cholecystitis is identified. Complicating this finding are areas of low attenuation involving the adjacent liver and gallbladder fossa with mild peripheral enhancement concerning for adjacent multifocal intrahepatic abscesses. A gallstone is identified. The common duct is normal. Otherwise, no enhancing mass of the liver is seen. Pancreas: No pancreatic ductal dilatation, enhancing  mass or inflammation. Spleen: Normal size spleen. Adrenals/Urinary Tract: Normal bilateral adrenal glands. Symmetric enhancement of the kidneys with small cyst arising off the interpolar right kidney measuring 13 mm. No hydroureteronephrosis. The urinary bladder is unremarkable. Stomach/Bowel: Small hiatal hernia. Decompressed stomach with normal small bowel rotation. No small-bowel obstruction. The distal and terminal ileum are normal. Patient is status post appendectomy. Sympathetic thickening of the colon traversing the right upper quadrant and mid upper abdomen is identified secondary to the inflamed gallbladder. Otherwise, there is scattered colonic diverticulosis along the descending colon with greater degree of sigmoid diverticulosis and circular muscle hypertrophy identified. Vascular/Lymphatic: Aortoiliac atherosclerosis. No pathologically enlarged lymph nodes. Small subcentimeter short axis periportal lymph nodes likely reactive in etiology are noted. Reproductive: The uterus and adnexa are unremarkable. Other: No free air free fluid. Musculoskeletal: No acute nor aggressive osseous abnormality. Degenerative changes are present along the dorsal spine. IMPRESSION: Acute cholecystitis with markedly distended gallbladder, transmural thickening and pericholecystic inflammation identified. Complicating this finding are adjacent intrahepatic and pericholecystic fluid attenuating hypodensities with slight peripheral enhancement compatible with adjacent abscesses some which appear to be intrahepatic subjacent to the gallbladder fossa. 13 mm cyst arising from the interpolar right without worrisome features. Colonic diverticulosis without acute diverticulitis. Electronically Signed   By: Ashley Royalty M.D.   On: 09/12/2017 17:23   US Abdomen Limited Ruq  Result Date: 09/13/2017 CLINICAL DATA:  57 year old female with right upper quadrant abdominal pain x3 days. EXAM: ULTRASOUND ABDOMEN LIMITED RIGHT UPPER QUADRANT  COMPARISON:  CT of the abdomen pelvis dated 09/12/2017 FINDINGS: Gallbladder: The gallbladder is distended. There gallbladder wall is irregular with multiple outpouchings. The gallbladder wall is thickened and edematous measuring 12 mm in thickness. There is sludge within the gallbladder. There is a small amount of pericholecystic fluid. Positive sonographic Murphy's sign. There is a 7.5 x 4.8 x 6.2 cm complex area adjacent to the gallbladder which may represent an abscess or a contained perforation. Common bile duct: Diameter: 4 mm Liver: There is diffuse increased liver echogenicity most consistent with fatty infiltration. Portal vein is patent on color Doppler imaging with normal direction of blood flow towards the liver. IMPRESSION: 1. Gallbladder sludge with findings of acute cholecystitis. Complex collection adjacent to the gallbladder may represent abscess or focally contained perforation. 2. Fatty liver. 3. Patent main portal vein with hepatopetal flow. Electronically Signed   By: Anner Crete M.D.   On: 09/13/2017 00:12        Scheduled Meds: . feeding supplement  1 Container Oral TID BM  . insulin aspart  0-15 Units Subcutaneous TID WC  . insulin aspart  0-5 Units Subcutaneous QHS  . mometasone-formoterol  2 puff Inhalation BID  . mupirocin ointment  1 application Nasal BID   Continuous Infusions: . sodium chloride Stopped (09/13/17 0205)  . sodium chloride Stopped (09/12/17 2152)  . heparin 1,600 Units/hr (09/13/17 0600)  . piperacillin-tazobactam (ZOSYN)  IV 3.375 g (  09/13/17 0818)  . vancomycin       LOS: 1 day        Aline August, MD [facility] Pager [phone]  If 7PM-7AM, please contact night-coverage [email or web address] Password [email or web address] 09/13/2017, 9:46 AM

## 2017-09-13 NOTE — Procedures (Signed)
Interventional Radiology Procedure Note  Procedure: Placement of a 68F percutaneous cholecystectomy tube.   Complications: None  Estimated Blood Loss: None  Recommendations: - Cultures sent - Drain to gravity  Signed,  Criselda Peaches, MD

## 2017-09-13 NOTE — Care Management Note (Signed)
Case Management Note  Patient Details  Name: [name] MRN: [id number] Date of Birth: 01/09/1961  Subjective/Objective:   Sepsis d/t cholecystitis, currently on IV abx, lives alone                 Action/Plan: NCM spoke to pt and states she had [place] in the past with [facility]. States she has a Medicaid aide that comes 5 days per week for 8 hours each week. States she needs more hours. Explained she can speak to PCP about send request to [facility] for an increase in hours with  Medicaid PCS. She has RW, bedside commode, shower chair, shower bench and Rollator at home. Will continue to follow for dc needs.   Expected Discharge Date              Expected Discharge Plan:  [place]  In-House Referral:  NA  Discharge planning Services  CM Consult  Post Acute Care Choice:  Home Health Choice offered to:  Patient  DME Arranged:  N/A DME Agency:  NA  HH Arranged:    [place] Agency:     Status of Service:  In process, will continue to follow  If discussed at Long Length of Stay Meetings, dates discussed:    Additional Comments:  Erenest Rasher, RN 09/13/2017, 11:49 AM

## 2017-09-14 LAB — COMPREHENSIVE METABOLIC PANEL
ALK PHOS: 150 U/L — AB (ref 38–126)
ALT: 57 U/L — AB (ref 0–44)
AST: 40 U/L (ref 15–41)
Albumin: 2 g/dL — ABNORMAL LOW (ref 3.5–5.0)
Anion gap: 9 (ref 5–15)
BILIRUBIN TOTAL: 1.1 mg/dL (ref 0.3–1.2)
BUN: 11 mg/dL (ref 6–20)
CALCIUM: 8.2 mg/dL — AB (ref 8.9–10.3)
CO2: 24 mmol/L (ref 22–32)
Chloride: 109 mmol/L (ref 98–111)
Creatinine, Ser: 1.11 mg/dL — ABNORMAL HIGH (ref 0.44–1.00)
GFR, EST NON AFRICAN AMERICAN: 54 mL/min — AB (ref >60)
Glucose, Bld: 93 mg/dL (ref 70–99)
Potassium: 3.5 mmol/L (ref 3.5–5.1)
SODIUM: 142 mmol/L (ref 135–145)
TOTAL PROTEIN: 6.3 g/dL — AB (ref 6.5–8.1)

## 2017-09-14 LAB — CBC WITH DIFFERENTIAL/PLATELET
BASOS ABS: 0 10*3/uL (ref 0.0–0.1)
Basophils Relative: 0 %
EOS ABS: 0.3 10*3/uL (ref 0.0–0.7)
EOS PCT: 3 %
HCT: 27.4 % — ABNORMAL LOW (ref 36.0–46.0)
Hemoglobin: 8.1 g/dL — ABNORMAL LOW (ref 12.0–15.0)
LYMPHS ABS: 1.8 10*3/uL (ref 0.7–4.0)
Lymphocytes Relative: 17 %
MCH: 27.6 pg (ref 26.0–34.0)
MCHC: 29.6 g/dL — ABNORMAL LOW (ref 30.0–36.0)
MCV: 93.2 fL (ref 78.0–100.0)
Monocytes Absolute: 0.6 10*3/uL (ref 0.1–1.0)
Monocytes Relative: 5 %
Neutro Abs: 7.9 10*3/uL — ABNORMAL HIGH (ref 1.7–7.7)
Neutrophils Relative %: 75 %
PLATELETS: 407 10*3/uL — AB (ref 150–400)
RBC: 2.94 MIL/uL — AB (ref 3.87–5.11)
RDW: 15.8 % — ABNORMAL HIGH (ref 11.5–15.5)
WBC: 10.5 10*3/uL (ref 4.0–10.5)

## 2017-09-14 LAB — GLUCOSE, CAPILLARY
GLUCOSE-CAPILLARY: 90 mg/dL (ref 70–99)
GLUCOSE-CAPILLARY: 79 mg/dL (ref 70–99)
Glucose-Capillary: 77 mg/dL (ref 70–99)
Glucose-Capillary: 85 mg/dL (ref 70–99)

## 2017-09-14 LAB — HEPARIN LEVEL (UNFRACTIONATED): HEPARIN UNFRACTIONATED: 0.84 [IU]/mL — AB (ref 0.30–0.70)

## 2017-09-14 LAB — MAGNESIUM: MAGNESIUM: 2.2 mg/dL (ref 1.7–2.4)

## 2017-09-14 LAB — APTT: aPTT: 96 seconds — ABNORMAL HIGH (ref 24–36)

## 2017-09-14 MED ORDER — RIVAROXABAN 10 MG PO TABS
20.0000 mg | ORAL_TABLET | Freq: Every day | ORAL | Status: DC
  Administered 2017-09-14 – 2017-09-18 (×5): 20 mg via ORAL
  Filled 2017-09-14 (×5): qty 2

## 2017-09-14 MED ORDER — VENLAFAXINE HCL ER 75 MG PO CP24
75.0000 mg | ORAL_CAPSULE | Freq: Every day | ORAL | Status: DC
  Administered 2017-09-15 – 2017-09-19 (×5): 75 mg via ORAL
  Filled 2017-09-14 (×5): qty 1

## 2017-09-14 MED ORDER — TOPIRAMATE 25 MG PO TABS
50.0000 mg | ORAL_TABLET | Freq: Two times a day (BID) | ORAL | Status: DC
  Administered 2017-09-14 – 2017-09-19 (×11): 50 mg via ORAL
  Filled 2017-09-14 (×11): qty 2

## 2017-09-14 MED ORDER — LIP MEDEX EX OINT
TOPICAL_OINTMENT | CUTANEOUS | Status: AC
  Administered 2017-09-14: 14:00:00
  Filled 2017-09-14: qty 7

## 2017-09-14 MED ORDER — PANTOPRAZOLE SODIUM 40 MG PO TBEC
40.0000 mg | DELAYED_RELEASE_TABLET | Freq: Every day | ORAL | Status: DC
  Administered 2017-09-14 – 2017-09-19 (×6): 40 mg via ORAL
  Filled 2017-09-14 (×6): qty 1

## 2017-09-14 NOTE — Progress Notes (Signed)
Pt was lying in bed and awake when I arrived. She described an incident regarding her meal earlier when she did not receive what she was expecting and tried to place a replacement order and was told by cafeteria staff that she could not. She said when she hung up she started crying and her visitors helped her through her moment. She ordered dinner during our visit and asked for it to be delivered at 6:30. Pt said she was in a little more pain today and besides that and the lunch issue, she was doing ok. Pt was grateful for visit and prayer. Please page if additional assistance is needed. [place], [place]   09/14/17 1700  Clinical Encounter Type  Visited With Patient

## 2017-09-14 NOTE — Progress Notes (Addendum)
ANTICOAGULATION CONSULT NOTE - Follow Up  Pharmacy Consult for IV heparin - transition back to Xarelto 9/1 Indication: History of DVT - bridge rivaroxaban  Allergies  Allergen Reactions  . Belsomra [Suvorexant] Other (See Comments)    [place] out of bed while asleep: "I'm waking up as I'm falling on the floor;" "Night terrors"  . Enalapril Maleate Cough  . Latex Hives  . Nickel Other (See Comments)    Blisters  Pt has a titanium right and left knee - nickel causes skin irritations that form into blisters and sores  . Other Other (See Comments)    Patient has Sarcoidosis and can't tolerate any metals  . Iodine Other (See Comments)    Patient received IV contrast without pre-meds without any problems 09/12/17 kwl Mother was intolerant--CODED on CT table---pt never tried  . Doxycycline Diarrhea and Nausea And Vomiting  . Hydrochlorothiazide Other (See Comments)    Low potassium levels   . Hydroxychloroquine Sulfate Other (See Comments)    Vision changes   . Lyrica [Pregabalin] Other (See Comments)    "Made me feel high"  . Tape Other (See Comments)    Medical tape causes bruising    Patient Measurements: Height: 5\' 7"  (170.2 cm) Weight: 264 lb 8.8 oz (120 kg) IBW/kg (Calculated) : 61.6 Heparin Dosing Weight: 92 kg   Vital Signs: Temp: 98.2 F (36.8 C) (09/01 0531) BP: 105/48 (09/01 0531) Pulse Rate: 75 (09/01 0531)  Labs: Recent Labs    09/12/17 1510  09/13/17 0413 09/13/17 1211 09/13/17 2150 09/14/17 0436 09/14/17 0841  HGB 9.6*  --  9.0*  --   --  8.1*  --   HCT 30.7*  --  29.8*  --   --  27.4*  --   PLT 349  --  419*  --   --  407*  --   APTT  --    < > 76* 75* 67*  --  96*  HEPARINUNFRC  --    < > >2.20* >2.20* 1.52*  --  0.84*  CREATININE 1.19*  --  1.43*  --   --  1.11*  --    < > = values in this interval not displayed.    Estimated Creatinine Clearance: 75 mL/min (A) (by C-G formula based on SCr of 1.11 mg/dL (H)).   Medical History: Past Medical  History:  Diagnosis Date  . Allergic rhinitis   . ALLERGIC RHINITIS 10/26/2009  . Anxiety   . ANXIETY 08/14/2006  . B12 DEFICIENCY 08/25/2007  . Complication of anesthesia    pt has had difficulty following anesthesia with her knee in 2016-unable to care for herself afterward  . Confusion   . Depression    takes Lexapro daily  . Diabetes mellitus without complication ([place])    was on insulin but has been off since Nov 2015 and now only takes Metformin daily  . DYSPNEA 04/28/2009   with exertion  . Esophageal reflux    takes Nexium daily  . Fibromyalgia   . Headache    last migraine 2-15yrs ago;takes Topamax daily  . History of shingles   . Hypertension    takes Coreg daily  . Insomnia    takes Nortriptyline nightly   . Joint pain   . Joint swelling   . Left knee pain   . Lichen planus   . Long-term memory loss   . Nocturia   . OSA (obstructive sleep apnea)    doesn't use CPAP;sleep study in epic  from 2006  . Osteoarthritis   . Osteoarthritis   . Pneumonia    over 30 yrs ago  . Protein calorie malnutrition ([place])   . Rheumatoid arthritis ([place])   . Sarcoidosis    Dr. Lolita Patella  . Short-term memory loss   . Shortness of breath   . TIA (transient ischemic attack)   . Unsteady gait   . Urinary urgency   . Vitamin D deficiency    is supposed to take Vit D but can't afford it  . VITAMIN D DEFICIENCY 08/25/2007    Medications:  Scheduled:  . feeding supplement  1 Container Oral TID BM  . insulin aspart  0-15 Units Subcutaneous TID WC  . insulin aspart  0-5 Units Subcutaneous QHS  . mometasone-formoterol  2 puff Inhalation BID  . mupirocin ointment  1 application Nasal BID  . sodium chloride flush  5 mL Intracatheter Q8H    Assessment: Pharmacy is consulted to start heparin drip in 57 yo female with PMH of DVT. Pt is on Xarelto 20 mg PO daily. Pt is being started on heparin as pt is also being evaluated for cholecystitis  Per med rec last dose of Xarelto was on 8/29 at  [place].  Of note, no weight has been entered while on this visit to ED. Dosing is based on a previous entry from 08/27/17 that had the pt's listed weight as 127 kg.   Today, 09/14/17   APTT therapeutic but rising after increase in heparin infusion rate from 1600 to 1700 units/hr last night  Heparin level supratherapeutic but continues to drop as expected due to Xarelto almost completed eliminated from body  No reported bleeding per RN  Hgb 8.1 this AM, down some from yesterday - monitor closely   Goal of Therapy:  Heparin level 0.3-0.7 units/ml Monitor platelets by anticoagulation protocol: Yes   Plan:  1) Continue current IV heparin level of 1700 units/hr 2) With pt on Xarelto prior to admission, will continue to obtain HL level as well as aPTT levels untill the aPTT levels and HL correlate 3) Will recheck heparin level and aPTT in 6 hours to confirm continued goal levels at current IV heparin rate especially since aPTT is currently at upper end of goal range 4) Monitor for signs and symptoms of bleeding 5) What is plan for restarting Xarelto with patient now with perc drain placed as of 8/31?   Adrian Saran, PharmD, BCPS Pager [phone] 09/14/2017 10:03 AM    --------------------------------------------------------------------------------------------------------------  Addendum, 09/14/17 11:15  A/P: To stop IV heparin per Md order and change back to Xarelto as patient was taking prior to admission. Discontinue IV heparin and start Xarelto same time as stop heparin - 20mg  daily  Adrian Saran, PharmD, BCPS Pager [phone] 09/14/2017 11:15 AM

## 2017-09-14 NOTE — Progress Notes (Signed)
Patient ID: [name], female   DOB: 29-Jan-1960, 57 y.o.   MRN: [id number]  PROGRESS NOTE    [name]  DXI:[id number] DOB: Sep 26, 1960 DOA: 09/12/2017 PCP: Cassandria Anger, MD   Brief Narrative:  57 year old female with history of CAD, hypertension, diabetes, depression, DVT/PE on Xarelto, OSA, COPD on 2 L oxygen via nasal cannula and sarcoidosis on Humira presented with abdominal pain, nausea, vomiting and diarrhea for 2 weeks.  CT abdomen on admission showed acute cholecystitis with markedly distended gallbladder, adjacent intrahepatic and pericholecystic fluid concerning for abscess.  She was started on intravenous antibiotics and general surgery was consulted.  IR was also consulted and patient underwent percutaneous cholecystostomy tube placement by IR on 09/13/2017.   Assessment & Plan:   Principal Problem:   Cholecystitis Active Problems:   Major depressive disorder, recurrent episode ([place])   Essential hypertension   CAD in native artery   Controlled type 2 diabetes mellitus with chronic kidney disease, without long-term current use of insulin (HCC)   Pulmonary embolism (HCC)   OSA (obstructive sleep apnea)   Diastolic dysfunction   COPD (chronic obstructive pulmonary disease) ([place])   Sepsis ([place])   Sepsis secondary to cholecystitis -Antibiotic plan as below.  Follow cultures.  Discontinue IV fluids.  Currently hemodynamically stable.  Acute cholecystitis with possible intrahepatic and pericholecystic abscess with elevated LFTs -General surgery following.  Patient underwent IR guided percutaneous cholecystostomy placement on 09/13/2017.  Follow cultures.  Continue broad-spectrum antibiotics. Pain management.  Follow with IR regarding management of cholecystostomy tube. -Repeat a.m. labs including LFTs  Leukocytosis -Probably secondary to above.  Resolved  History of DVT/PE -Xarelto on hold.  Currently on heparin drip per pharmacy.  Spoke to  interventional radiologist on call/Dr. Laurence Ferrari who is okay for Xarelto to be restarted.  Will restart Xarelto  COPD with chronic hypoxic respiratory failure -Currently stable.  Continue Dulera.  Continue as needed duo nebs.  Continue oxygen supplementation  History of coronary artery disease -Currently no chest pain.  Not on statin or aspirin.  Coreg on hold because of sepsis and hypotension  Diabetes mellitus type 2 -Oral meds on hold -Continue CBGs with sliding scale insulin  Chronic diastolic heart failure -Currently no signs of volume overload.  Lasix on hold because of hypotension.  Strict input and output.  Daily weights  OSA -Does not use CPAP  Morbid obesity -Outpatient follow-up  Depression -Continue Effexor once able to take orally   DVT prophylaxis: Heparin Code Status: Full Family Communication: None at bedside Disposition Plan: Depends on clinical outcome  Consultants: General surgery/IR  Procedures: Percutaneous cholecystostomy tube placement by IR on 09/13/2017  Antimicrobials: Zosyn and vancomycin from 09/12/2017 onwards.    Subjective: Patient seen and examined at bedside.  She feels slightly better but still complains of intermittent nausea and abdominal pain.  No overnight fever, chest pain or shortness of breath. Objective: Vitals:   09/13/17 1927 09/13/17 2108 09/14/17 0531 09/14/17 0827  BP:  (!) 104/51 (!) 105/48   Pulse:  83 75   Resp:  14 17   Temp:  100 F (37.8 C) 98.2 F (36.8 C)   TempSrc:  Oral    SpO2: 95% 97% 99% 99%  Weight:      Height:        Intake/Output Summary (Last 24 hours) at 09/14/2017 1054 Last data filed at 09/14/2017 0847 Gross per 24 hour  Intake 3587.34 ml  Output 2125 ml  Net 1462.34 ml  Filed Weights   09/12/17 2117  Weight: 120 kg    Examination:  General exam: Appears older than stated age.  No acute distress Respiratory system: Bilateral decreased breath sounds at bases with some scattered  crackles Cardiovascular system: Rate controlled, S1-S2 heard Gastrointestinal system: Abdomen is slightly distended, soft, tender in the right upper quadrant.  Percutaneous cholecystostomy drain in right upper quadrant present normal bowel sounds heard. Extremities: No cyanosis, edema  Central nervous system: Alert and oriented. No focal neurological deficits. Moving extremities Skin: No rashes, lesions or ulcers Psychiatry: Slightly anxious.    Data Reviewed: I have personally reviewed following labs and imaging studies  CBC: Recent Labs  Lab 09/12/17 1510 09/13/17 0413 09/14/17 0436  WBC 21.9* 22.6* 10.5  NEUTROABS 19.0*  --  7.9*  HGB 9.6* 9.0* 8.1*  HCT 30.7* 29.8* 27.4*  MCV 90.8 92.8 93.2  PLT 349 419* 474*   Basic Metabolic Panel: Recent Labs  Lab 09/12/17 1510 09/13/17 0413 09/14/17 0436  NA 138 140 142  K 3.8 3.9 3.5  CL 105 105 109  CO2 24 25 24   GLUCOSE 129* 137* 93  BUN 11 14 11   CREATININE 1.19* 1.43* 1.11*  CALCIUM 8.1* 8.4* 8.2*  MG  --   --  2.2   GFR: Estimated Creatinine Clearance: 75 mL/min (A) (by C-G formula based on SCr of 1.11 mg/dL (H)). Liver Function Tests: Recent Labs  Lab 09/12/17 1510 09/13/17 0413 09/14/17 0436  AST 148* 127* 40  ALT 94* 95* 57*  ALKPHOS 217* 215* 150*  BILITOT 3.3* 4.0* 1.1  PROT 7.1 7.3 6.3*  ALBUMIN 2.3* 2.4* 2.0*   Recent Labs  Lab 09/12/17 1510  LIPASE 34   No results for input(s): AMMONIA in the last 168 hours. Coagulation Profile: No results for input(s): INR, PROTIME in the last 168 hours. Cardiac Enzymes: No results for input(s): CKTOTAL, CKMB, CKMBINDEX, TROPONINI in the last 168 hours. BNP (last 3 results) Recent Labs    11/11/16 1051  PROBNP 194.0*   HbA1C: No results for input(s): HGBA1C in the last 72 hours. CBG: Recent Labs  Lab 09/13/17 1147 09/13/17 1538 09/13/17 1735 09/13/17 2110 09/14/17 0734  GLUCAP 88 77 89 87 77   Lipid Profile: No results for input(s): CHOL, HDL,  LDLCALC, TRIG, CHOLHDL, LDLDIRECT in the last 72 hours. Thyroid Function Tests: No results for input(s): TSH, T4TOTAL, FREET4, T3FREE, THYROIDAB in the last 72 hours. Anemia Panel: No results for input(s): VITAMINB12, FOLATE, FERRITIN, TIBC, IRON, RETICCTPCT in the last 72 hours. Sepsis Labs: Recent Labs  Lab 09/12/17 1524 09/12/17 1814  LATICACIDVEN 1.05 0.68    Recent Results (from the past 240 hour(s))  Blood Culture (routine x 2)     Status: None (Preliminary result)   Collection Time: 09/12/17  6:07 PM  Result Value Ref Range Status   Specimen Description   Final    BLOOD RIGHT ANTECUBITAL Performed at [facility] [address], [place], [place] [address]    Special Requests   Final    BOTTLES DRAWN AEROBIC AND ANAEROBIC Blood Culture adequate volume Performed at [facility] [address] [place], [place] [address]    Culture   Final    NO GROWTH 2 DAYS Performed at [facility] [address], [place], [place] [address]    Report Status PENDING  Incomplete  Surgical PCR screen     Status: Abnormal   Collection Time: 09/13/17  1:47 AM  Result Value Ref Range Status   MRSA, PCR NEGATIVE NEGATIVE Final   Staphylococcus aureus POSITIVE (A) NEGATIVE Final    Comment: (NOTE) The Xpert SA Assay (FDA approved for NASAL specimens in patients 21 years of age and older), is one component of a comprehensive surveillance program. It is not intended to diagnose infection nor to guide or monitor treatment. Performed at [facility], [place] [address], [place], [place] [address]   Aerobic/Anaerobic Culture (surgical/deep wound)     Status: None (Preliminary result)   Collection Time: 09/13/17  2:11 PM  Result Value Ref Range Status   Specimen Description   Final    ABSCESS GALL BLADDER Performed at [facility] [address], [place], [place] [address]    Special Requests   Final     Normal Performed at [facility], [place] [address], [place], [place] [address]    Gram Stain   Final    FEW WBC PRESENT, PREDOMINANTLY PMN MODERATE GRAM POSITIVE COCCI IN CLUSTERS Performed at [facility], [place] [address], [place], [place] [address]    Culture PENDING  Incomplete   Report Status PENDING  Incomplete         Radiology Studies: Dg Chest 2 View  Result Date: 09/12/2017 CLINICAL DATA:  Productive cough and chills EXAM: CHEST - 2 VIEW COMPARISON:  06/02/2017 FINDINGS: Cardiac enlargement with mild vascular congestion. Negative for edema or effusion. Negative for pneumonia IMPRESSION: Cardiac enlargement and mild vascular congestion. Negative for pneumonia or edema. Electronically Signed   By: Franchot Gallo M.D.   On: 09/12/2017 18:01   Ct Abdomen Pelvis W Contrast  Result Date: 09/12/2017 CLINICAL DATA:  Acute generalized abdominal pain x2 weeks with diarrhea and weight loss. EXAM: CT ABDOMEN AND PELVIS WITH CONTRAST TECHNIQUE: Multidetector CT imaging of the abdomen and pelvis was performed using the standard protocol following bolus administration of intravenous contrast. CONTRAST:  158mL ISOVUE-300 IOPAMIDOL (ISOVUE-300) INJECTION 61% COMPARISON:  None. FINDINGS: Lower chest: Heart size is normal. Minimal atelectatic subpleural nodular density in the left lower lobe on axial series 6/11 most likely represents a small focus of atelectasis given its more band like appearance on coronal reformats. No effusion or pulmonary consolidation. Hepatobiliary: Distended gallbladder with transmural thickening and pericholecystic fluid consistent with acute cholecystitis is identified. Complicating this finding are areas of low attenuation involving the adjacent liver and gallbladder fossa with mild peripheral enhancement concerning for adjacent multifocal intrahepatic abscesses. A gallstone is identified. The common duct is normal. Otherwise, no enhancing mass of the  liver is seen. Pancreas: No pancreatic ductal dilatation, enhancing mass or inflammation. Spleen: Normal size spleen. Adrenals/Urinary Tract: Normal bilateral adrenal glands. Symmetric enhancement of the kidneys with small cyst arising off the interpolar right kidney measuring 13 mm. No hydroureteronephrosis. The urinary bladder is unremarkable. Stomach/Bowel: Small hiatal hernia. Decompressed stomach with normal small bowel rotation. No small-bowel obstruction. The distal and terminal ileum are normal. Patient is status post appendectomy. Sympathetic thickening of the colon traversing the right upper quadrant and mid upper abdomen is identified secondary to the inflamed gallbladder. Otherwise, there is scattered colonic diverticulosis along the descending colon with greater degree of sigmoid diverticulosis and circular muscle hypertrophy identified. Vascular/Lymphatic: Aortoiliac atherosclerosis. No pathologically enlarged lymph nodes. Small subcentimeter short axis periportal lymph nodes likely reactive in etiology are noted. Reproductive: The uterus and adnexa are unremarkable. Other: No free air free fluid. Musculoskeletal: No acute nor aggressive osseous abnormality. Degenerative changes are  present along the dorsal spine. IMPRESSION: Acute cholecystitis with markedly distended gallbladder, transmural thickening and pericholecystic inflammation identified. Complicating this finding are adjacent intrahepatic and pericholecystic fluid attenuating hypodensities with slight peripheral enhancement compatible with adjacent abscesses some which appear to be intrahepatic subjacent to the gallbladder fossa. 13 mm cyst arising from the interpolar right without worrisome features. Colonic diverticulosis without acute diverticulitis. Electronically Signed   By: Ashley Royalty M.D.   On: 09/12/2017 17:23   Ir Perc Cholecystostomy  Result Date: 09/13/2017 INDICATION: 57 year old female with acute cholecystitis. She is a  poor operative candidate and presents for percutaneous cholecystostomy tube placement. EXAM: CHOLECYSTOSTOMY MEDICATIONS: Patient is on intravenous antibiotics as an inpatient. No additional prophylaxis administered. ANESTHESIA/SEDATION: Moderate (conscious) sedation was employed during this procedure. A total of Versed 2 mg and Fentanyl 50 mcg was administered intravenously. Moderate Sedation Time: 12 minutes. The patient's level of consciousness and vital signs were monitored continuously by radiology nursing throughout the procedure under my direct supervision. FLUOROSCOPY TIME:  Fluoroscopy Time: 0 minutes 53 seconds (54 mGy). COMPLICATIONS: None immediate. PROCEDURE: Informed written consent was obtained from the patient after a thorough discussion of the procedural risks, benefits and alternatives. All questions were addressed. Maximal Sterile Barrier Technique was utilized including caps, mask, sterile gowns, sterile gloves, sterile drape, hand hygiene and skin antiseptic. A timeout was performed prior to the initiation of the procedure. Ultrasound was used to interrogate the right upper quadrant. The grossly inflamed and irregular gallbladder is successfully identified. A suitable skin entry site was selected and local anesthesia attained by infiltration with 1% lidocaine. A small dermatotomy was made. Under real-time sonographic guidance, a 21 gauge Accustick needle was advanced through the skin, through a short transhepatic course and into the lumen of the gallbladder. The 0.018 wire was then coiled in the distended gallbladder lumen. The Accustick needle was exchanged for the Accustick sheath. A gentle hand injection of contrast material was performed opacifying the gallbladder lumen. A 75 cm superstiff Amplatz wire was then advanced and coiled in the gallbladder lumen. The Accustick sheath was removed. The soft tissue tract was dilated to 10 [place] and a Cook 10.2 [place] all-purpose drainage catheter was  advanced over the wire and formed in the gallbladder. There was return of foul-smelling milky bile. A sample was collected and sent for culture. A final gentle hand injection of contrast material was performed and a fluoroscopic spot image was saved to document the position of the drainage catheter. The tube was connected to gravity bag drainage and secured to the skin with 0 Prolene suture. The patient tolerated the procedure well. IMPRESSION: Successful placement of a 10 French transhepatic percutaneous cholecystostomy tube for acute cholecystitis. Electronically Signed   By: Jacqulynn Cadet M.D.   On: 09/13/2017 14:22   US Abdomen Limited Ruq  Result Date: 09/13/2017 CLINICAL DATA:  57 year old female with right upper quadrant abdominal pain x3 days. EXAM: ULTRASOUND ABDOMEN LIMITED RIGHT UPPER QUADRANT COMPARISON:  CT of the abdomen pelvis dated 09/12/2017 FINDINGS: Gallbladder: The gallbladder is distended. There gallbladder wall is irregular with multiple outpouchings. The gallbladder wall is thickened and edematous measuring 12 mm in thickness. There is sludge within the gallbladder. There is a small amount of pericholecystic fluid. Positive sonographic Murphy's sign. There is a 7.5 x 4.8 x 6.2 cm complex area adjacent to the gallbladder which may represent an abscess or a contained perforation. Common bile duct: Diameter: 4 mm Liver: There is diffuse increased liver echogenicity most consistent with fatty  infiltration. Portal vein is patent on color Doppler imaging with normal direction of blood flow towards the liver. IMPRESSION: 1. Gallbladder sludge with findings of acute cholecystitis. Complex collection adjacent to the gallbladder may represent abscess or focally contained perforation. 2. Fatty liver. 3. Patent main portal vein with hepatopetal flow. Electronically Signed   By: Anner Crete M.D.   On: 09/13/2017 00:12        Scheduled Meds: . feeding supplement  1 Container Oral TID BM   . insulin aspart  0-15 Units Subcutaneous TID WC  . insulin aspart  0-5 Units Subcutaneous QHS  . mometasone-formoterol  2 puff Inhalation BID  . mupirocin ointment  1 application Nasal BID  . sodium chloride flush  5 mL Intracatheter Q8H   Continuous Infusions: . sodium chloride 75 mL/hr at 09/14/17 0642  . sodium chloride 75 mL/hr at 09/13/17 1443  . heparin 1,700 Units/hr (09/13/17 2332)  . piperacillin-tazobactam (ZOSYN)  IV 3.375 g (09/14/17 0905)  . vancomycin 1,000 mg (09/13/17 2128)     LOS: 2 days        Aline August, MD [facility] Pager [phone]  If 7PM-7AM, please contact night-coverage [email or web address] Password [facility] 09/14/2017, 10:54 AM

## 2017-09-14 NOTE — Progress Notes (Signed)
Patient ID: [name], female   DOB: 21-Jun-1960, 57 y.o.   MRN: [id number] [facility] Progress Note:   * No surgery found *  Subjective: Mental status is fairly clear;  Complaining of nausea Objective: Vital signs in last 24 hours: Temp:  [98 F (36.7 C)-100 F (37.8 C)] 98.2 F (36.8 C) (09/01 0531) Pulse Rate:  [74-84] 75 (09/01 0531) Resp:  [10-18] 17 (09/01 0531) BP: (88-105)/(48-63) 105/48 (09/01 0531) SpO2:  [74 %-100 %] 99 % (09/01 0827)  Intake/Output from previous day: 08/31 0701 - 09/01 0700 In: 3587.3 [P.O.:840; I.V.:2386.1; IV Piggyback:351.3] Out: 1350 [Urine:1000; Drains:350] Intake/Output this shift: Total I/O In: -  Out: 875 [Urine:800; Drains:75]  Physical Exam: Perc drain in place;  Nauseated but has plate of hash browns and sausage--  Lab Results:  Results for orders placed or performed during the hospital encounter of 09/12/17 (from the past 48 hour(s))  CBC with Differential     Status: Abnormal   Collection Time: 09/12/17  3:10 PM  Result Value Ref Range   WBC 21.9 (H) 4.0 - 10.5 K/uL   RBC 3.38 (L) 3.87 - 5.11 MIL/uL   Hemoglobin 9.6 (L) 12.0 - 15.0 g/dL   HCT 30.7 (L) 36.0 - 46.0 %   MCV 90.8 78.0 - 100.0 fL   MCH 28.4 26.0 - 34.0 pg   MCHC 31.3 30.0 - 36.0 g/dL   RDW 15.5 11.5 - 15.5 %   Platelets 349 150 - 400 K/uL   Neutrophils Relative % 86 %   Neutro Abs 19.0 (H) 1.7 - 7.7 K/uL   Lymphocytes Relative 9 %   Lymphs Abs 1.9 0.7 - 4.0 K/uL   Monocytes Relative 5 %   Monocytes Absolute 1.0 0.1 - 1.0 K/uL   Eosinophils Relative 0 %   Eosinophils Absolute 0.1 0.0 - 0.7 K/uL   Basophils Relative 0 %   Basophils Absolute 0.0 0.0 - 0.1 K/uL    Comment: Performed at [facility], [place] [address], [place], [place] [address]  Comprehensive metabolic panel     Status: Abnormal   Collection Time: 09/12/17  3:10 PM  Result Value Ref Range   Sodium 138 135 - 145 mmol/L   Potassium 3.8 3.5 - 5.1 mmol/L    Chloride 105 98 - 111 mmol/L   CO2 24 22 - 32 mmol/L   Glucose, Bld 129 (H) 70 - 99 mg/dL   BUN 11 6 - 20 mg/dL   Creatinine, Ser 1.19 (H) 0.44 - 1.00 mg/dL   Calcium 8.1 (L) 8.9 - 10.3 mg/dL   Total Protein 7.1 6.5 - 8.1 g/dL   Albumin 2.3 (L) 3.5 - 5.0 g/dL   AST 148 (H) 15 - 41 U/L   ALT 94 (H) 0 - 44 U/L   Alkaline Phosphatase 217 (H) 38 - 126 U/L   Total Bilirubin 3.3 (H) 0.3 - 1.2 mg/dL   GFR calc non Af Amer 50 (L) >60 mL/min   GFR calc Af Amer 58 (L) >60 mL/min    Comment: (NOTE) The eGFR has been calculated using the CKD EPI equation. This calculation has not been validated in all clinical situations. eGFR's persistently <60 mL/min signify possible Chronic Kidney Disease.    Anion gap 9 5 - 15    Comment: Performed at [facility], [place] [address] [place], [place] [address]  Lipase, blood     Status: None   Collection Time: 09/12/17  3:10 PM  Result  Value Ref Range   Lipase 34 11 - 51 U/L    Comment: Performed at [facility], [place] [address] [place], [place] [address]  I-Stat CG4 Lactic Acid, ED     Status: None   Collection Time: 09/12/17  3:24 PM  Result Value Ref Range   Lactic Acid, Venous 1.05 0.5 - 1.9 mmol/L  Urinalysis, Routine w reflex microscopic     Status: Abnormal   Collection Time: 09/12/17  6:07 PM  Result Value Ref Range   Color, Urine AMBER (A) YELLOW    Comment: BIOCHEMICALS MAY BE AFFECTED BY COLOR   APPearance CLEAR CLEAR   Specific Gravity, Urine 1.044 (H) 1.005 - 1.030   pH 6.0 5.0 - 8.0   Glucose, UA NEGATIVE NEGATIVE mg/dL   Hgb urine dipstick MODERATE (A) NEGATIVE   Bilirubin Urine SMALL (A) NEGATIVE   Ketones, ur NEGATIVE NEGATIVE mg/dL   Protein, ur NEGATIVE NEGATIVE mg/dL   Nitrite NEGATIVE NEGATIVE   Leukocytes, UA NEGATIVE NEGATIVE   RBC / HPF 6-10 0 - 5 RBC/hpf   WBC, UA 0-5 0 - 5 WBC/hpf   Bacteria, UA NONE SEEN NONE SEEN   Squamous Epithelial / LPF 0-5 0 - 5    Comment: Performed at  [facility], [place] [address], [place], [place] [address]  Blood Culture (routine x 2)     Status: None (Preliminary result)   Collection Time: 09/12/17  6:07 PM  Result Value Ref Range   Specimen Description      BLOOD RIGHT ANTECUBITAL Performed at [place] [address], [place], [place] [address]    Special Requests      BOTTLES DRAWN AEROBIC AND ANAEROBIC Blood Culture adequate volume Performed at [place] [address], [place], [place] [address]    Culture      NO GROWTH 2 DAYS Performed at [facility], [place] [address], [place], [place] [address]    Report Status PENDING   I-Stat CG4 Lactic Acid, ED     Status: None   Collection Time: 09/12/17  6:14 PM  Result Value Ref Range   Lactic Acid, Venous 0.68 0.5 - 1.9 mmol/L  Brain natriuretic peptide     Status: Abnormal   Collection Time: 09/12/17  7:00 PM  Result Value Ref Range   B Natriuretic Peptide 204.1 (H) 0.0 - 100.0 pg/mL    Comment: Performed at [facility], [place] [address] [place],  [address]  Glucose, capillary     Status: Abnormal   Collection Time: 09/12/17 10:00 PM  Result Value Ref Range   Glucose-Capillary 104 (H) 70 - 99 mg/dL   Comment 1 Notify RN    Comment 2 Document in Chart   Surgical PCR screen     Status: Abnormal   Collection Time: 09/13/17  1:47 AM  Result Value Ref Range   MRSA, PCR NEGATIVE NEGATIVE   Staphylococcus aureus POSITIVE (A) NEGATIVE    Comment: (NOTE) The Xpert SA Assay (FDA approved for NASAL specimens in patients 40 years of age and older), is one component of a comprehensive surveillance program. It is not intended to diagnose infection nor to guide or monitor treatment. Performed at [facility], [place] [address], [place], [place] [address]   Heparin level (unfractionated)     Status: Abnormal   Collection Time: 09/13/17  4:13 AM  Result Value Ref Range    Heparin Unfractionated >2.20 (H)  0.30 - 0.70 IU/mL    Comment: RESULTS CONFIRMED BY MANUAL DILUTION RESULT CALLED TO, READ BACK BY AND VERIFIED WITH: T COUNCIL,RN 09/13/17 1400 RHOLMES (NOTE) If heparin results are below expected values, and patient dosage has  been confirmed, suggest follow up testing of antithrombin III levels. Performed at [facility], [place] [address], [place], [place] [address] CORRECTED ON 08/31 AT 1401: PREVIOUSLY REPORTED AS 1.91 RESULTS CONFIRMED BY MANUAL DILUTION   APTT     Status: Abnormal   Collection Time: 09/13/17  4:13 AM  Result Value Ref Range   aPTT 76 (H) 24 - 36 seconds    Comment:        IF BASELINE aPTT IS ELEVATED, SUGGEST PATIENT RISK ASSESSMENT BE USED TO DETERMINE APPROPRIATE ANTICOAGULANT THERAPY. Performed at [facility], [place] [address] [place], [place] [address]   HIV antibody (Routine Testing)     Status: None   Collection Time: 09/13/17  4:13 AM  Result Value Ref Range   HIV Screen 4th Generation wRfx Non Reactive Non Reactive    Comment: (NOTE) Performed At: [facility] [place], [place] [id number] Rush Farmer MD TU:[id number]   Comprehensive metabolic panel     Status: Abnormal   Collection Time: 09/13/17  4:13 AM  Result Value Ref Range   Sodium 140 135 - 145 mmol/L   Potassium 3.9 3.5 - 5.1 mmol/L   Chloride 105 98 - 111 mmol/L   CO2 25 22 - 32 mmol/L   Glucose, Bld 137 (H) 70 - 99 mg/dL   BUN 14 6 - 20 mg/dL   Creatinine, Ser 1.43 (H) 0.44 - 1.00 mg/dL   Calcium 8.4 (L) 8.9 - 10.3 mg/dL   Total Protein 7.3 6.5 - 8.1 g/dL   Albumin 2.4 (L) 3.5 - 5.0 g/dL   AST 127 (H) 15 - 41 U/L   ALT 95 (H) 0 - 44 U/L   Alkaline Phosphatase 215 (H) 38 - 126 U/L   Total Bilirubin 4.0 (H) 0.3 - 1.2 mg/dL   GFR calc non Af Amer 40 (L) >60 mL/min   GFR calc Af Amer 46 (L) >60 mL/min    Comment: (NOTE) The eGFR has been calculated using the CKD EPI equation. This  calculation has not been validated in all clinical situations. eGFR's persistently <60 mL/min signify possible Chronic Kidney Disease.    Anion gap 10 5 - 15    Comment: Performed at [facility], [place] [address], [place], [place] [address]  CBC     Status: Abnormal   Collection Time: 09/13/17  4:13 AM  Result Value Ref Range   WBC 22.6 (H) 4.0 - 10.5 K/uL   RBC 3.21 (L) 3.87 - 5.11 MIL/uL   Hemoglobin 9.0 (L) 12.0 - 15.0 g/dL   HCT 29.8 (L) 36.0 - 46.0 %   MCV 92.8 78.0 - 100.0 fL   MCH 28.0 26.0 - 34.0 pg   MCHC 30.2 30.0 - 36.0 g/dL   RDW 15.8 (H) 11.5 - 15.5 %   Platelets 419 (H) 150 - 400 K/uL    Comment: Performed at [facility], [place] [address], [place],  [address]  Glucose, capillary     Status: Abnormal   Collection Time: 09/13/17  7:41 AM  Result Value Ref Range   Glucose-Capillary 117 (H) 70 - 99 mg/dL  Glucose, capillary     Status: None   Collection Time: 09/13/17 11:47 AM  Result  Value Ref Range   Glucose-Capillary 88 70 - 99 mg/dL  Heparin level (unfractionated)     Status: Abnormal   Collection Time: 09/13/17 12:11 PM  Result Value Ref Range   Heparin Unfractionated >2.20 (H) 0.30 - 0.70 IU/mL    Comment: RESULTS CONFIRMED BY MANUAL DILUTION Performed at [place] [address], [place], [place] [address]   APTT     Status: Abnormal   Collection Time: 09/13/17 12:11 PM  Result Value Ref Range   aPTT 75 (H) 24 - 36 seconds    Comment:        IF BASELINE aPTT IS ELEVATED, SUGGEST PATIENT RISK ASSESSMENT BE USED TO DETERMINE APPROPRIATE ANTICOAGULANT THERAPY. Performed at [facility], [place] [address] [place], [place] [address]   Aerobic/Anaerobic Culture (surgical/deep wound)     Status: None (Preliminary result)   Collection Time: 09/13/17  2:11 PM  Result Value Ref Range   Specimen Description      ABSCESS GALL BLADDER Performed at [place] [address] [place], [place] [address]    Special Requests      Normal Performed at [facility], [place] [address] [place], [place] [address]    Gram Stain      FEW WBC PRESENT, PREDOMINANTLY PMN MODERATE GRAM POSITIVE COCCI IN CLUSTERS Performed at [facility], [place] [address] [place], [place] [address]    Culture PENDING    Report Status PENDING   Glucose, capillary     Status: None   Collection Time: 09/13/17  3:38 PM  Result Value Ref Range   Glucose-Capillary 77 70 - 99 mg/dL  Glucose, capillary     Status: None   Collection Time: 09/13/17  5:35 PM  Result Value Ref Range   Glucose-Capillary 89 70 - 99 mg/dL  Glucose, capillary     Status: None   Collection Time: 09/13/17  9:10 PM  Result Value Ref Range   Glucose-Capillary 87 70 - 99 mg/dL  Heparin level (unfractionated)     Status: Abnormal   Collection Time: 09/13/17  9:50 PM  Result Value Ref Range   Heparin Unfractionated 1.52 (H) 0.30 - 0.70 IU/mL    Comment: RESULTS CONFIRMED BY MANUAL DILUTION Performed at [place] [address], [place], [place] [address]   APTT     Status: Abnormal   Collection Time: 09/13/17  9:50 PM  Result Value Ref Range   aPTT 67 (H) 24 - 36 seconds    Comment:        IF BASELINE aPTT IS ELEVATED, SUGGEST PATIENT RISK ASSESSMENT BE USED TO DETERMINE APPROPRIATE ANTICOAGULANT THERAPY. Performed at [facility], [place] [address], [place], [place] [address]   Comprehensive metabolic panel     Status: Abnormal   Collection Time: 09/14/17  4:36 AM  Result Value Ref Range   Sodium 142 135 - 145 mmol/L   Potassium 3.5 3.5 - 5.1 mmol/L   Chloride 109 98 - 111 mmol/L   CO2 24 22 - 32 mmol/L   Glucose, Bld 93 70 - 99 mg/dL   BUN 11 6 - 20 mg/dL   Creatinine, Ser 1.11 (H) 0.44 - 1.00 mg/dL   Calcium 8.2 (L) 8.9 - 10.3 mg/dL   Total Protein 6.3 (L) 6.5 - 8.1 g/dL   Albumin 2.0 (L) 3.5 - 5.0 g/dL   AST 40 15 - 41 U/L    ALT 57 (H) 0 -  44 U/L   Alkaline Phosphatase 150 (H) 38 - 126 U/L   Total Bilirubin 1.1 0.3 - 1.2 mg/dL   GFR calc non Af Amer 54 (L) >60 mL/min   GFR calc Af Amer >60 >60 mL/min    Comment: (NOTE) The eGFR has been calculated using the CKD EPI equation. This calculation has not been validated in all clinical situations. eGFR's persistently <60 mL/min signify possible Chronic Kidney Disease.    Anion gap 9 5 - 15    Comment: Performed at [facility], [place] [address] [place], [place] [address]  Magnesium     Status: None   Collection Time: 09/14/17  4:36 AM  Result Value Ref Range   Magnesium 2.2 1.7 - 2.4 mg/dL    Comment: Performed at [facility], [place] [address] [place], [place] [address]  CBC with Differential/Platelet     Status: Abnormal   Collection Time: 09/14/17  4:36 AM  Result Value Ref Range   WBC 10.5 4.0 - 10.5 K/uL   RBC 2.94 (L) 3.87 - 5.11 MIL/uL   Hemoglobin 8.1 (L) 12.0 - 15.0 g/dL   HCT 27.4 (L) 36.0 - 46.0 %   MCV 93.2 78.0 - 100.0 fL   MCH 27.6 26.0 - 34.0 pg   MCHC 29.6 (L) 30.0 - 36.0 g/dL   RDW 15.8 (H) 11.5 - 15.5 %   Platelets 407 (H) 150 - 400 K/uL   Neutrophils Relative % 75 %   Neutro Abs 7.9 (H) 1.7 - 7.7 K/uL   Lymphocytes Relative 17 %   Lymphs Abs 1.8 0.7 - 4.0 K/uL   Monocytes Relative 5 %   Monocytes Absolute 0.6 0.1 - 1.0 K/uL   Eosinophils Relative 3 %   Eosinophils Absolute 0.3 0.0 - 0.7 K/uL   Basophils Relative 0 %   Basophils Absolute 0.0 0.0 - 0.1 K/uL    Comment: Performed at [facility], [place] [address], [place], [place] [address]  Glucose, capillary     Status: None   Collection Time: 09/14/17  7:34 AM  Result Value Ref Range   Glucose-Capillary 77 70 - 99 mg/dL    Radiology/Results: Dg Chest 2 View  Result Date: 09/12/2017 CLINICAL DATA:  Productive cough and chills EXAM: CHEST - 2 VIEW COMPARISON:  06/02/2017 FINDINGS: Cardiac enlargement with mild vascular  congestion. Negative for edema or effusion. Negative for pneumonia IMPRESSION: Cardiac enlargement and mild vascular congestion. Negative for pneumonia or edema. Electronically Signed   By: Franchot Gallo M.D.   On: 09/12/2017 18:01   Ct Abdomen Pelvis W Contrast  Result Date: 09/12/2017 CLINICAL DATA:  Acute generalized abdominal pain x2 weeks with diarrhea and weight loss. EXAM: CT ABDOMEN AND PELVIS WITH CONTRAST TECHNIQUE: Multidetector CT imaging of the abdomen and pelvis was performed using the standard protocol following bolus administration of intravenous contrast. CONTRAST:  194m ISOVUE-300 IOPAMIDOL (ISOVUE-300) INJECTION 61% COMPARISON:  None. FINDINGS: Lower chest: Heart size is normal. Minimal atelectatic subpleural nodular density in the left lower lobe on axial series 6/11 most likely represents a small focus of atelectasis given its more band like appearance on coronal reformats. No effusion or pulmonary consolidation. Hepatobiliary: Distended gallbladder with transmural thickening and pericholecystic fluid consistent with acute cholecystitis is identified. Complicating this finding are areas of low attenuation involving the adjacent liver and gallbladder fossa with mild peripheral enhancement concerning for adjacent multifocal intrahepatic abscesses. A gallstone is identified. The common duct is normal. Otherwise, no enhancing  mass of the liver is seen. Pancreas: No pancreatic ductal dilatation, enhancing mass or inflammation. Spleen: Normal size spleen. Adrenals/Urinary Tract: Normal bilateral adrenal glands. Symmetric enhancement of the kidneys with small cyst arising off the interpolar right kidney measuring 13 mm. No hydroureteronephrosis. The urinary bladder is unremarkable. Stomach/Bowel: Small hiatal hernia. Decompressed stomach with normal small bowel rotation. No small-bowel obstruction. The distal and terminal ileum are normal. Patient is status post appendectomy. Sympathetic  thickening of the colon traversing the right upper quadrant and mid upper abdomen is identified secondary to the inflamed gallbladder. Otherwise, there is scattered colonic diverticulosis along the descending colon with greater degree of sigmoid diverticulosis and circular muscle hypertrophy identified. Vascular/Lymphatic: Aortoiliac atherosclerosis. No pathologically enlarged lymph nodes. Small subcentimeter short axis periportal lymph nodes likely reactive in etiology are noted. Reproductive: The uterus and adnexa are unremarkable. Other: No free air free fluid. Musculoskeletal: No acute nor aggressive osseous abnormality. Degenerative changes are present along the dorsal spine. IMPRESSION: Acute cholecystitis with markedly distended gallbladder, transmural thickening and pericholecystic inflammation identified. Complicating this finding are adjacent intrahepatic and pericholecystic fluid attenuating hypodensities with slight peripheral enhancement compatible with adjacent abscesses some which appear to be intrahepatic subjacent to the gallbladder fossa. 13 mm cyst arising from the interpolar right without worrisome features. Colonic diverticulosis without acute diverticulitis. Electronically Signed   By: Ashley Royalty M.D.   On: 09/12/2017 17:23   Ir Perc Cholecystostomy  Result Date: 09/13/2017 INDICATION: 57 year old female with acute cholecystitis. She is a poor operative candidate and presents for percutaneous cholecystostomy tube placement. EXAM: CHOLECYSTOSTOMY MEDICATIONS: Patient is on intravenous antibiotics as an inpatient. No additional prophylaxis administered. ANESTHESIA/SEDATION: Moderate (conscious) sedation was employed during this procedure. A total of Versed 2 mg and Fentanyl 50 mcg was administered intravenously. Moderate Sedation Time: 12 minutes. The patient's level of consciousness and vital signs were monitored continuously by radiology nursing throughout the procedure under my direct  supervision. FLUOROSCOPY TIME:  Fluoroscopy Time: 0 minutes 53 seconds (54 mGy). COMPLICATIONS: None immediate. PROCEDURE: Informed written consent was obtained from the patient after a thorough discussion of the procedural risks, benefits and alternatives. All questions were addressed. Maximal Sterile Barrier Technique was utilized including caps, mask, sterile gowns, sterile gloves, sterile drape, hand hygiene and skin antiseptic. A timeout was performed prior to the initiation of the procedure. Ultrasound was used to interrogate the right upper quadrant. The grossly inflamed and irregular gallbladder is successfully identified. A suitable skin entry site was selected and local anesthesia attained by infiltration with 1% lidocaine. A small dermatotomy was made. Under real-time sonographic guidance, a 21 gauge Accustick needle was advanced through the skin, through a short transhepatic course and into the lumen of the gallbladder. The 0.018 wire was then coiled in the distended gallbladder lumen. The Accustick needle was exchanged for the Accustick sheath. A gentle hand injection of contrast material was performed opacifying the gallbladder lumen. A 75 cm superstiff Amplatz wire was then advanced and coiled in the gallbladder lumen. The Accustick sheath was removed. The soft tissue tract was dilated to 10 [place] and a Cook 10.2 [place] all-purpose drainage catheter was advanced over the wire and formed in the gallbladder. There was return of foul-smelling milky bile. A sample was collected and sent for culture. A final gentle hand injection of contrast material was performed and a fluoroscopic spot image was saved to document the position of the drainage catheter. The tube was connected to gravity bag drainage and secured to the skin with  0 Prolene suture. The patient tolerated the procedure well. IMPRESSION: Successful placement of a 10 French transhepatic percutaneous cholecystostomy tube for acute cholecystitis.  Electronically Signed   By: Jacqulynn Cadet M.D.   On: 09/13/2017 14:22   US Abdomen Limited Ruq  Result Date: 09/13/2017 CLINICAL DATA:  57 year old female with right upper quadrant abdominal pain x3 days. EXAM: ULTRASOUND ABDOMEN LIMITED RIGHT UPPER QUADRANT COMPARISON:  CT of the abdomen pelvis dated 09/12/2017 FINDINGS: Gallbladder: The gallbladder is distended. There gallbladder wall is irregular with multiple outpouchings. The gallbladder wall is thickened and edematous measuring 12 mm in thickness. There is sludge within the gallbladder. There is a small amount of pericholecystic fluid. Positive sonographic Murphy's sign. There is a 7.5 x 4.8 x 6.2 cm complex area adjacent to the gallbladder which may represent an abscess or a contained perforation. Common bile duct: Diameter: 4 mm Liver: There is diffuse increased liver echogenicity most consistent with fatty infiltration. Portal vein is patent on color Doppler imaging with normal direction of blood flow towards the liver. IMPRESSION: 1. Gallbladder sludge with findings of acute cholecystitis. Complex collection adjacent to the gallbladder may represent abscess or focally contained perforation. 2. Fatty liver. 3. Patent main portal vein with hepatopetal flow. Electronically Signed   By: Anner Crete M.D.   On: 09/13/2017 00:12    Anti-infectives: Anti-infectives (From admission, onward)   Start     Dose/Rate Route Frequency Ordered Stop   09/13/17 2200  vancomycin (VANCOCIN) 1,250 mg in sodium chloride 0.9 % 250 mL IVPB  Status:  Discontinued     1,250 mg 166.7 mL/hr over 90 Minutes Intravenous Every 24 hours 09/12/17 2131 09/13/17 1130   09/13/17 2200  vancomycin (VANCOCIN) IVPB 1000 mg/200 mL premix     1,000 mg 200 mL/hr over 60 Minutes Intravenous Every 24 hours 09/13/17 1130     09/13/17 0000  piperacillin-tazobactam (ZOSYN) IVPB 3.375 g     3.375 g 12.5 mL/hr over 240 Minutes Intravenous Every 8 hours 09/12/17 1938     09/12/17  2200  vancomycin (VANCOCIN) 2,000 mg in sodium chloride 0.9 % 500 mL IVPB     2,000 mg 250 mL/hr over 120 Minutes Intravenous  Once 09/12/17 2107 09/13/17 0053   09/12/17 1745  piperacillin-tazobactam (ZOSYN) IVPB 3.375 g     3.375 g 100 mL/hr over 30 Minutes Intravenous  Once 09/12/17 1739 09/12/17 1841      Assessment/Plan: Problem List: Patient Active Problem List   Diagnosis Date Noted  . Cholecystitis 09/12/2017  . Sepsis ([place]) 09/12/2017  . Cerumen impaction 08/27/2017  . Dacrocystitis, right 06/02/2017  . Orbital cellulitis on right 05/23/2017  . Preseptal cellulitis 05/23/2017  . Cellulitis 05/23/2017  . Left arm pain 03/10/2017  . COPD (chronic obstructive pulmonary disease) ([place]) 12/30/2016  . Syncope 12/17/2016  . Low back pain 12/17/2016  . Diastolic dysfunction 01/16/7251  . OSA (obstructive sleep apnea) 09/30/2016  . Chronic respiratory failure with hypoxia ([place]) 08/23/2016  . Pulmonary embolism ([place]) 07/21/2016  . DVT (deep venous thrombosis) ([place]) 07/21/2016  . Cough 07/11/2016  . Balance problems 06/14/2016  . Pain of right scapula 06/07/2016  . Acute bronchitis 01/30/2016  . Weight gain 12/20/2015  . Acute pulmonary embolism ([place]) 08/17/2015  . Controlled type 2 diabetes mellitus with chronic kidney disease, without long-term current use of insulin ([place]) 02/01/2015  . Postoperative anemia due to acute blood loss 02/01/2015  . Arthritis of knee, degenerative 11/15/2014  . Calf pain 01/21/2014  . Insomnia  12/23/2013  . Leg ulcer, left ([place]) 09/13/2013  . Abnormal SPEP 06/04/2013  . Memory loss 04/27/2013  . Panic attacks 03/17/2013  . Right knee pain 12/29/2012  . Rash and nonspecific skin eruption 10/27/2012  . Dizziness and giddiness 09/07/2012  . Pulmonary nodules 07/20/2012  . CAD in native artery 05/28/2012  . Seborrheic dermatitis 01/20/2012  . Recurrent knee pain 09/03/2011  . Knee pain, left 08/03/2011  . Leg pain, left 07/17/2011  .  Polydipsia 05/09/2011  . Abscess of axilla, left 08/10/2010  . Obesity 03/07/2010  . INSOMNIA, CHRONIC 03/07/2010  . NAUSEA 03/07/2010  . Avulsion fracture of tooth 03/07/2010  . Allergic rhinitis 10/26/2009  . GRIEF REACTION 08/01/2009  . DYSPNEA 04/28/2009  . MAXILLARY SINUSITIS 10/05/2008  . Essential hypertension 08/27/2007  . B12 deficiency 08/25/2007  . Vitamin D deficiency 08/25/2007  . ELEVATED BP 07/10/2007  . VISUAL CHANGES 07/02/2007  . UTI 02/07/2007  . ABDOMINAL PAIN RIGHT LOWER QUADRANT 02/07/2007  . SINUSITIS, ACUTE 02/02/2007  . Osteoarthritis 02/02/2007  . Sarcoidosis 08/14/2006  . Anxiety state 08/14/2006  . Major depressive disorder, recurrent episode ([place]) 08/14/2006  . GERD 08/14/2006    Post drainage nausea.  Continue present treatment.   * No surgery found *    LOS: 2 days   Matt B. Hassell Done, MD, [facility] [phone] beeper [phone]  09/14/2017 9:36 AM

## 2017-09-15 ENCOUNTER — Inpatient Hospital Stay (HOSPITAL_COMMUNITY): Payer: Medicare Other

## 2017-09-15 LAB — CBC WITH DIFFERENTIAL/PLATELET
BASOS PCT: 0 %
Basophils Absolute: 0 10*3/uL (ref 0.0–0.1)
EOS PCT: 3 %
Eosinophils Absolute: 0.2 10*3/uL (ref 0.0–0.7)
HCT: 30.7 % — ABNORMAL LOW (ref 36.0–46.0)
Hemoglobin: 9.1 g/dL — ABNORMAL LOW (ref 12.0–15.0)
LYMPHS PCT: 21 %
Lymphs Abs: 1.6 10*3/uL (ref 0.7–4.0)
MCH: 27.7 pg (ref 26.0–34.0)
MCHC: 29.6 g/dL — ABNORMAL LOW (ref 30.0–36.0)
MCV: 93.6 fL (ref 78.0–100.0)
MONO ABS: 0.5 10*3/uL (ref 0.1–1.0)
Monocytes Relative: 6 %
Neutro Abs: 5.3 10*3/uL (ref 1.7–7.7)
Neutrophils Relative %: 70 %
PLATELETS: 493 10*3/uL — AB (ref 150–400)
RBC: 3.28 MIL/uL — AB (ref 3.87–5.11)
RDW: 15.8 % — AB (ref 11.5–15.5)
WBC: 7.7 10*3/uL (ref 4.0–10.5)

## 2017-09-15 LAB — COMPREHENSIVE METABOLIC PANEL
ALBUMIN: 1.9 g/dL — AB (ref 3.5–5.0)
ALT: 41 U/L (ref 0–44)
AST: 23 U/L (ref 15–41)
Alkaline Phosphatase: 136 U/L — ABNORMAL HIGH (ref 38–126)
Anion gap: 7 (ref 5–15)
BUN: 7 mg/dL (ref 6–20)
CHLORIDE: 110 mmol/L (ref 98–111)
CO2: 27 mmol/L (ref 22–32)
CREATININE: 1.04 mg/dL — AB (ref 0.44–1.00)
Calcium: 8.2 mg/dL — ABNORMAL LOW (ref 8.9–10.3)
GFR calc Af Amer: >60 mL/min (ref >60)
GFR, EST NON AFRICAN AMERICAN: 58 mL/min — AB (ref >60)
GLUCOSE: 117 mg/dL — AB (ref 70–99)
POTASSIUM: 3.6 mmol/L (ref 3.5–5.1)
SODIUM: 144 mmol/L (ref 135–145)
Total Bilirubin: 0.8 mg/dL (ref 0.3–1.2)
Total Protein: 6.6 g/dL (ref 6.5–8.1)

## 2017-09-15 LAB — GLUCOSE, CAPILLARY
GLUCOSE-CAPILLARY: 110 mg/dL — AB (ref 70–99)
GLUCOSE-CAPILLARY: 126 mg/dL — AB (ref 70–99)
Glucose-Capillary: 135 mg/dL — ABNORMAL HIGH (ref 70–99)
Glucose-Capillary: 123 mg/dL — ABNORMAL HIGH (ref 70–99)

## 2017-09-15 LAB — MAGNESIUM: Magnesium: 2.3 mg/dL (ref 1.7–2.4)

## 2017-09-15 MED ORDER — ACETAMINOPHEN 325 MG PO TABS
650.0000 mg | ORAL_TABLET | Freq: Four times a day (QID) | ORAL | Status: DC | PRN
  Administered 2017-09-15 – 2017-09-19 (×8): 650 mg via ORAL
  Filled 2017-09-15 (×8): qty 2

## 2017-09-15 MED ORDER — FUROSEMIDE 20 MG PO TABS
20.0000 mg | ORAL_TABLET | Freq: Every day | ORAL | Status: DC
  Administered 2017-09-15 – 2017-09-16 (×2): 20 mg via ORAL
  Filled 2017-09-15 (×3): qty 1

## 2017-09-15 NOTE — Telephone Encounter (Signed)
Noted. Thx.

## 2017-09-15 NOTE — Evaluation (Signed)
Physical Therapy Evaluation Patient Details Name: [name] MRN: [id number] DOB: [birth year] Today's Date: 09/15/2017   History of Present Illness  57 year old female with history of CAD, hypertension, diabetes, depression, DVT/PE, OSA, COPD on 2 L O2 and sarcoidosis Pt  presented with abdominal pain, nausea, vomiting and diarrhea.   CT abdomen showed acute cholecystitis with markedly distended gallbladder, adjacent intrahepatic and pericholecystic fluid concerning for abscess.  She was started on intravenous antibiotics and general surgery and  IR consulted and patient underwent percutaneous cholecystostomy tube placement by IR on 09/13/2017.  Clinical Impression  Pt admitted with above diagnosis. Pt currently with functional limitations due to the deficits listed below (see PT Problem List). Pt amb 35' with RW and min assist, requiring seated rest  d/t pain and fatigue; recommend HHPT at d/c; pt is grossly mod IND at her baseline, uses transport service to go to grocery store, appointments, etc;  Pt will benefit from skilled PT to increase their independence and safety with mobility to allow discharge to the venue listed below.     Follow Up Recommendations Home health PT;Supervision - Intermittent    Equipment Recommendations  None recommended by PT    Recommendations for Other Services       Precautions / Restrictions Precautions Precautions: Fall Restrictions Weight Bearing Restrictions: No      Mobility  Bed Mobility Overal bed mobility: Needs Assistance Bed Mobility: Supine to Sit     Supine to sit: Min guard;HOB elevated     General bed mobility comments: incr itme, min/guard to elevate trunk  Transfers Overall transfer level: Needs assistance   Transfers: Sit to/from Stand Sit to Stand: Min assist         General transfer comment: min assist to rise and stabilize, cues for hand placement  Ambulation/Gait Ambulation/Gait assistance: Min guard   35' Assistive device: Rolling walker (2 wheeled) Gait Pattern/deviations: Step-through pattern;Decreased stride length;Trunk flexed     General Gait Details: cues for posture and RW position, fatigues after above distance--chair to pt for seated rest  Stairs            Wheelchair Mobility    Modified Rankin (Stroke Patients Only)       Balance Overall balance assessment: Needs assistance(denies any recent falls)   Sitting balance-Leahy Scale: Fair       Standing balance-Leahy Scale: Poor Standing balance comment: reliant on UE support                             Pertinent Vitals/Pain Pain Assessment: Faces Faces Pain Scale: Hurts little more Pain Location: chest (d/t coughing) Pain Descriptors / Indicators: Grimacing Pain Intervention(s): Limited activity within patient's tolerance;Monitored during session    Home Living Family/patient expects to be discharged to:: Private residence Living Arrangements: Alone   Type of Home: Apartment Home Access: Level entry     Home Layout: One level Home Equipment: Environmental consultant - 2 wheels;Walker - 4 wheels;Cane - quad      Prior Function Level of Independence: Independent with assistive device(s)         Comments: amb with rollator or quad cane     Hand Dominance        Extremity/Trunk Assessment   Upper Extremity Assessment Upper Extremity Assessment: Generalized weakness    Lower Extremity Assessment Lower Extremity Assessment: Generalized weakness       Communication   Communication: No difficulties  Cognition Arousal/Alertness: Awake/alert Behavior During Therapy:  WFL for tasks assessed/performed Overall Cognitive Status: Within Functional Limits for tasks assessed                                        General Comments      Exercises     Assessment/Plan    PT Assessment Patient needs continued PT services  PT Problem List Decreased activity tolerance;Decreased  strength;Decreased mobility       PT Treatment Interventions DME instruction;Gait training;Functional mobility training;Therapeutic activities;Therapeutic exercise;Patient/family education    PT Goals (Current goals can be found in the Care Plan section)  Acute Rehab PT Goals Patient Stated Goal: home soon PT Goal Formulation: With patient Time For Goal Achievement: 09/29/17 Potential to Achieve Goals: Good    Frequency Min 3X/week   Barriers to discharge        Co-evaluation               AM-PAC PT "6 Clicks" Daily Activity  Outcome Measure Difficulty turning over in bed (including adjusting bedclothes, sheets and blankets)?: A Lot Difficulty moving from lying on back to sitting on the side of the bed? : Unable Difficulty sitting down on and standing up from a chair with arms (e.g., wheelchair, bedside commode, etc,.)?: Unable Help needed moving to and from a bed to chair (including a wheelchair)?: A Little Help needed walking in hospital room?: A Little Help needed climbing 3-5 steps with a railing? : A Lot 6 Click Score: 12    End of Session Equipment Utilized During Treatment: Gait belt;Oxygen(2L (home O2 dependent)) Activity Tolerance: Patient tolerated treatment well Patient left: with call bell/phone within reach;with chair alarm set;with family/visitor present;in chair   PT Visit Diagnosis: Difficulty in walking, not elsewhere classified (R26.2);Muscle weakness (generalized) (M62.81)    Time: 0569-7948 PT Time Calculation (min) (ACUTE ONLY): 20 min   Charges:   PT Evaluation $PT Eval Low Complexity: 1 Low          Kenyon Ana, PT Pager: [phone] 09/15/2017   [facility] [phone]   [facility] 09/15/2017, 1:46 PM

## 2017-09-15 NOTE — Progress Notes (Signed)
Patient ID: [name], female   DOB: 1961-01-09, 57 y.o.   MRN: [id number] Since this patient is not a surgical candidate, CCS will sign off.    Kaylyn Lim, MD, FACS

## 2017-09-15 NOTE — Progress Notes (Signed)
Patient ID: [name], female   DOB: 02-10-1960, 57 y.o.   MRN: [id number]  PROGRESS NOTE    [name]  DUK:[id number] DOB: [birth year] DOA: 09/12/2017 PCP: Cassandria Anger, MD   Brief Narrative:  57 year old female with history of CAD, hypertension, diabetes, depression, DVT/PE on Xarelto, OSA, COPD on 2 L oxygen via nasal cannula and sarcoidosis on Humira presented with abdominal pain, nausea, vomiting and diarrhea for 2 weeks.  CT abdomen on admission showed acute cholecystitis with markedly distended gallbladder, adjacent intrahepatic and pericholecystic fluid concerning for abscess.  She was started on intravenous antibiotics and general surgery was consulted.  IR was also consulted and patient underwent percutaneous cholecystostomy tube placement by IR on 09/13/2017.   Assessment & Plan:   Principal Problem:   Cholecystitis Active Problems:   Major depressive disorder, recurrent episode ([place])   Essential hypertension   CAD in native artery   Controlled type 2 diabetes mellitus with chronic kidney disease, without long-term current use of insulin (H[facility])   Pulmonary embolism ([facility])   OSA (obstructive sleep apnea)   Diastolic dysfunction   COPD (chronic obstructive pulmonary disease) ([place])   Sepsis ([place])   Sepsis secondary to cholecystitis -Antibiotic plan as below.  Follow cultures.  Currently hemodynamically stable.  Acute cholecystitis with possible intrahepatic and pericholecystic abscess with elevated LFTs -General surgery deemed that patient was not a surgical candidate.  Patient underwent IR guided percutaneous cholecystostomy placement on 09/13/2017.  Follow cultures.  Continue broad-spectrum antibiotics. Pain management.  Follow with IR regarding management of cholecystostomy tube. -LFTs improving.  Repeat a.m. labs including LFTs -Patient complaining of cramping abdominal pain, will get x-ray of abdomen.  Leukocytosis -Probably secondary to  above.  Resolved  History of DVT/PE -Continue Xarelto  COPD with chronic hypoxic respiratory failure -Currently stable.  Continue Dulera.  Continue as needed duo nebs.  Continue oxygen supplementation  History of coronary artery disease -Currently no chest pain.  Not on statin or aspirin.  Coreg on hold because of sepsis and hypotension  Diabetes mellitus type 2 -Oral meds on hold -Continue CBGs with sliding scale insulin  Chronic diastolic heart failure -Currently no signs of volume overload.  Lasix on hold currently.  Strict input and output.  Daily weights.  Will probably restart Lasix.  OSA -Does not use CPAP  Morbid obesity -Outpatient follow-up  Depression -Continue Effexor   DVT prophylaxis: Xarelto Code Status: Full Family Communication: None at bedside Disposition Plan: Depends on clinical outcome  Consultants: General surgery/IR  Procedures: Percutaneous cholecystostomy tube placement by IR on 09/13/2017  Antimicrobials: Zosyn and vancomycin from 09/12/2017 onwards.    Subjective: Patient seen and examined at bedside.  She complains of nausea and cramping abdominal pain.  No overnight vomiting.  No fevers. Objective: Vitals:   09/14/17 2042 09/14/17 2208 09/15/17 0543 09/15/17 0803  BP:  (!) 102/55 (!) 149/70   Pulse:  72 85   Resp:  16 16   Temp:  98.1 F (36.7 C) 98.9 F (37.2 C)   TempSrc:  Oral Oral   SpO2: 98% 97% 95% 97%  Weight:      Height:        Intake/Output Summary (Last 24 hours) at 09/15/2017 0827 Last data filed at 09/15/2017 3762 Gross per 24 hour  Intake 1581.61 ml  Output 1250 ml  Net 331.61 ml   Filed Weights   09/12/17 2117  Weight: 120 kg    Examination:  General exam: Mild distress secondary to  abdominal pain and nausea.  Appears older than stated age Respiratory system: Bilateral decreased breath sounds at bases with some scattered crackles, no wheezing Cardiovascular system: S1-S2 heard, rate  controlled Gastrointestinal system: Abdomen is slightly distended, soft, mildly tender in the right upper quadrant.  No rebound tenderness percutaneous cholecystostomy drain in right upper quadrant present normal bowel sounds heard. Extremities: No cyanosis, edema  Central nervous system: Alert and oriented. No focal neurological deficits. Moving extremities Skin: No rashes, lesions or ulcers Psychiatry: Looks anxious    Data Reviewed: I have personally reviewed following labs and imaging studies  CBC: Recent Labs  Lab 09/12/17 1510 09/13/17 0413 09/14/17 0436 09/15/17 0406  WBC 21.9* 22.6* 10.5 7.7  NEUTROABS 19.0*  --  7.9* 5.3  HGB 9.6* 9.0* 8.1* 9.1*  HCT 30.7* 29.8* 27.4* 30.7*  MCV 90.8 92.8 93.2 93.6  PLT 349 419* 407* 597*   Basic Metabolic Panel: Recent Labs  Lab 09/12/17 1510 09/13/17 0413 09/14/17 0436 09/15/17 0406  NA 138 140 142 144  K 3.8 3.9 3.5 3.6  CL 105 105 109 110  CO2 24 25 24 27   GLUCOSE 129* 137* 93 117*  BUN 11 14 11 7   CREATININE 1.19* 1.43* 1.11* 1.04*  CALCIUM 8.1* 8.4* 8.2* 8.2*  MG  --   --  2.2 2.3   GFR: Estimated Creatinine Clearance: 80.1 mL/min (A) (by C-G formula based on SCr of 1.04 mg/dL (H)). Liver Function Tests: Recent Labs  Lab 09/12/17 1510 09/13/17 0413 09/14/17 0436 09/15/17 0406  AST 148* 127* 40 23  ALT 94* 95* 57* 41  ALKPHOS 217* 215* 150* 136*  BILITOT 3.3* 4.0* 1.1 0.8  PROT 7.1 7.3 6.3* 6.6  ALBUMIN 2.3* 2.4* 2.0* 1.9*   Recent Labs  Lab 09/12/17 1510  LIPASE 34   No results for input(s): AMMONIA in the last 168 hours. Coagulation Profile: No results for input(s): INR, PROTIME in the last 168 hours. Cardiac Enzymes: No results for input(s): CKTOTAL, CKMB, CKMBINDEX, TROPONINI in the last 168 hours. BNP (last 3 results) Recent Labs    11/11/16 1051  PROBNP 194.0*   HbA1C: No results for input(s): HGBA1C in the last 72 hours. CBG: Recent Labs  Lab 09/14/17 0734 09/14/17 1124  09/14/17 1540 09/14/17 2206 09/15/17 0747  GLUCAP 77 90 85 79 110*   Lipid Profile: No results for input(s): CHOL, HDL, LDLCALC, TRIG, CHOLHDL, LDLDIRECT in the last 72 hours. Thyroid Function Tests: No results for input(s): TSH, T4TOTAL, FREET4, T3FREE, THYROIDAB in the last 72 hours. Anemia Panel: No results for input(s): VITAMINB12, FOLATE, FERRITIN, TIBC, IRON, RETICCTPCT in the last 72 hours. Sepsis Labs: Recent Labs  Lab 09/12/17 1524 09/12/17 1814  LATICACIDVEN 1.05 0.68    Recent Results (from the past 240 hour(s))  Blood Culture (routine x 2)     Status: None (Preliminary result)   Collection Time: 09/12/17  6:07 PM  Result Value Ref Range Status   Specimen Description   Final    BLOOD RIGHT ARM Performed at [facility] [address], [place], [place] [address]    Special Requests   Final    BOTTLES DRAWN AEROBIC AND ANAEROBIC Blood Culture adequate volume Performed at [facility] [address], [place], [place] [address]    Culture   Final    NO GROWTH 3 DAYS Performed at [facility], [place] [address], [place], [place] [address]    Report Status PENDING  Incomplete  Blood  Culture (routine x 2)     Status: None (Preliminary result)   Collection Time: 09/12/17  6:07 PM  Result Value Ref Range Status   Specimen Description   Final    BLOOD RIGHT ANTECUBITAL Performed at [facility] [address] [place], [place] [address]    Special Requests   Final    BOTTLES DRAWN AEROBIC AND ANAEROBIC Blood Culture adequate volume Performed at [facility] [address], [place], [place] [address]    Culture   Final    NO GROWTH 3 DAYS Performed at [facility], [place] [address] [place], [place] [address]    Report Status PENDING  Incomplete  Surgical PCR screen     Status: Abnormal   Collection Time: 09/13/17  1:47 AM  Result Value Ref Range Status   MRSA, PCR NEGATIVE  NEGATIVE Final   Staphylococcus aureus POSITIVE (A) NEGATIVE Final    Comment: (NOTE) The Xpert SA Assay (FDA approved for NASAL specimens in patients 9 years of age and older), is one component of a comprehensive surveillance program. It is not intended to diagnose infection nor to guide or monitor treatment. Performed at [facility], [place] [address], [place], [place] [address]   Aerobic/Anaerobic Culture (surgical/deep wound)     Status: None (Preliminary result)   Collection Time: 09/13/17  2:11 PM  Result Value Ref Range Status   Specimen Description   Final    ABSCESS GALL BLADDER Performed at [facility] [address] [place], [place] [address]    Special Requests   Final    Normal Performed at [facility], [place] [address], [place], [place] [address]    Gram Stain   Final    FEW WBC PRESENT, PREDOMINANTLY PMN MODERATE GRAM POSITIVE COCCI IN CLUSTERS    Culture   Final    CULTURE REINCUBATED FOR BETTER GROWTH Performed at [facility], [place] [address], [place], [place] [address]    Report Status PENDING  Incomplete         Radiology Studies: Ir Perc Cholecystostomy  Result Date: 09/13/2017 INDICATION: 57 year old female with acute cholecystitis. She is a poor operative candidate and presents for percutaneous cholecystostomy tube placement. EXAM: CHOLECYSTOSTOMY MEDICATIONS: Patient is on intravenous antibiotics as an inpatient. No additional prophylaxis administered. ANESTHESIA/SEDATION: Moderate (conscious) sedation was employed during this procedure. A total of Versed 2 mg and Fentanyl 50 mcg was administered intravenously. Moderate Sedation Time: 12 minutes. The patient's level of consciousness and vital signs were monitored continuously by radiology nursing throughout the procedure under my direct supervision. FLUOROSCOPY TIME:  Fluoroscopy Time: 0 minutes 53 seconds (54 mGy). COMPLICATIONS: None  immediate. PROCEDURE: Informed written consent was obtained from the patient after a thorough discussion of the procedural risks, benefits and alternatives. All questions were addressed. Maximal Sterile Barrier Technique was utilized including caps, mask, sterile gowns, sterile gloves, sterile drape, hand hygiene and skin antiseptic. A timeout was performed prior to the initiation of the procedure. Ultrasound was used to interrogate the right upper quadrant. The grossly inflamed and irregular gallbladder is successfully identified. A suitable skin entry site was selected and local anesthesia attained by infiltration with 1% lidocaine. A small dermatotomy was made. Under real-time sonographic guidance, a 21 gauge Accustick needle was advanced through the skin, through a short transhepatic course and into the lumen of the gallbladder. The 0.018 wire was then coiled in the distended gallbladder lumen. The Accustick  needle was exchanged for the Accustick sheath. A gentle hand injection of contrast material was performed opacifying the gallbladder lumen. A 75 cm superstiff Amplatz wire was then advanced and coiled in the gallbladder lumen. The Accustick sheath was removed. The soft tissue tract was dilated to 10 [place] and a Cook 10.2 [place] all-purpose drainage catheter was advanced over the wire and formed in the gallbladder. There was return of foul-smelling milky bile. A sample was collected and sent for culture. A final gentle hand injection of contrast material was performed and a fluoroscopic spot image was saved to document the position of the drainage catheter. The tube was connected to gravity bag drainage and secured to the skin with 0 Prolene suture. The patient tolerated the procedure well. IMPRESSION: Successful placement of a 10 French transhepatic percutaneous cholecystostomy tube for acute cholecystitis. Electronically Signed   By: Jacqulynn Cadet M.D.   On: 09/13/2017 14:22        Scheduled  Meds: . feeding supplement  1 Container Oral TID BM  . insulin aspart  0-15 Units Subcutaneous TID WC  . insulin aspart  0-5 Units Subcutaneous QHS  . mometasone-formoterol  2 puff Inhalation BID  . pantoprazole  40 mg Oral Daily  . rivaroxaban  20 mg Oral Q supper  . sodium chloride flush  5 mL Intracatheter Q8H  . topiramate  50 mg Oral BID  . venlafaxine XR  75 mg Oral Q breakfast   Continuous Infusions: . sodium chloride 75 mL/hr at 09/13/17 1443  . piperacillin-tazobactam (ZOSYN)  IV 3.375 g (09/15/17 0013)  . vancomycin 1,000 mg (09/14/17 2205)     LOS: 3 days        Aline August, MD [facility] Pager [phone]  If 7PM-7AM, please contact night-coverage [email or web address] Password [facility] 09/15/2017, 8:27 AM

## 2017-09-15 NOTE — Progress Notes (Signed)
Pharmacy Antibiotic Note  [name] is a 57 y.o. female admitted on 09/12/2017 with cholecystitis.  Pharmacy has been consulted for vancomycin and Zosyn for sepsis.  Today, 09/15/17  Day 3 abxs  Scr improved  WBC stable WNL  Afebrile  Abscess culture re-incubated for better growth - was showing GPC in clusters prior to that  Plan:  1) Renal function improving - will change vanc from 1g IV q24 to 1g IV q12 - goal AUC 400-500 2) Continue current Zosyn dosing 3) Daily SCr 4) Discontinue vanc as soon as clinically possible once abscess culture finalized  Height: 5\' 7"  (170.2 cm) Weight: 270 lb 14.4 oz (122.9 kg) IBW/kg (Calculated) : 61.6  Temp (24hrs), Avg:98.5 F (36.9 C), Min:98.1 F (36.7 C), Max:98.9 F (37.2 C)  Recent Labs  Lab 09/12/17 1510 09/12/17 1524 09/12/17 1814 09/13/17 0413 09/14/17 0436 09/15/17 0406  WBC 21.9*  --   --  22.6* 10.5 7.7  CREATININE 1.19*  --   --  1.43* 1.11* 1.04*  LATICACIDVEN  --  1.05 0.68  --   --   --     Estimated Creatinine Clearance: 81.1 mL/min (A) (by C-G formula based on SCr of 1.04 mg/dL (H)).    Allergies  Allergen Reactions  . Belsomra [Suvorexant] Other (See Comments)    [place] out of bed while asleep: "I'm waking up as I'm falling on the floor;" "Night terrors"  . Enalapril Maleate Cough  . Latex Hives  . Nickel Other (See Comments)    Blisters  Pt has a titanium right and left knee - nickel causes skin irritations that form into blisters and sores  . Other Other (See Comments)    Patient has Sarcoidosis and can't tolerate any metals  . Iodine Other (See Comments)    Patient received IV contrast without pre-meds without any problems 09/12/17 kwl Mother was intolerant--CODED on CT table---pt never tried  . Doxycycline Diarrhea and Nausea And Vomiting  . Hydrochlorothiazide Other (See Comments)    Low potassium levels   . Hydroxychloroquine Sulfate Other (See Comments)    Vision changes   . Lyrica  [Pregabalin] Other (See Comments)    "Made me feel high"  . Tape Other (See Comments)    Medical tape causes bruising    Thank you for allowing pharmacy to be a part of this patient's care.   Adrian Saran, PharmD, BCPS Pager [phone] 09/15/2017 10:44 AM

## 2017-09-15 NOTE — Progress Notes (Signed)
Patient complaining of intermittent headache that is not relieved by PRN pain medication. MD paged.

## 2017-09-16 ENCOUNTER — Ambulatory Visit: Payer: Self-pay

## 2017-09-16 LAB — COMPREHENSIVE METABOLIC PANEL
ALT: 28 U/L (ref 0–44)
ANION GAP: 6 (ref 5–15)
AST: 20 U/L (ref 15–41)
Albumin: 1.9 g/dL — ABNORMAL LOW (ref 3.5–5.0)
Alkaline Phosphatase: 129 U/L — ABNORMAL HIGH (ref 38–126)
BUN: 5 mg/dL — AB (ref 6–20)
CHLORIDE: 109 mmol/L (ref 98–111)
CO2: 28 mmol/L (ref 22–32)
Calcium: 8.2 mg/dL — ABNORMAL LOW (ref 8.9–10.3)
Creatinine, Ser: 0.96 mg/dL (ref 0.44–1.00)
Glucose, Bld: 109 mg/dL — ABNORMAL HIGH (ref 70–99)
POTASSIUM: 3.3 mmol/L — AB (ref 3.5–5.1)
Sodium: 143 mmol/L (ref 135–145)
Total Bilirubin: 0.6 mg/dL (ref 0.3–1.2)
Total Protein: 6.1 g/dL — ABNORMAL LOW (ref 6.5–8.1)

## 2017-09-16 LAB — CBC WITH DIFFERENTIAL/PLATELET
BASOS ABS: 0 10*3/uL (ref 0.0–0.1)
Basophils Relative: 1 %
EOS PCT: 5 %
Eosinophils Absolute: 0.3 10*3/uL (ref 0.0–0.7)
HCT: 27.5 % — ABNORMAL LOW (ref 36.0–46.0)
Hemoglobin: 8.2 g/dL — ABNORMAL LOW (ref 12.0–15.0)
LYMPHS ABS: 1.6 10*3/uL (ref 0.7–4.0)
LYMPHS PCT: 27 %
MCH: 28 pg (ref 26.0–34.0)
MCHC: 29.8 g/dL — AB (ref 30.0–36.0)
MCV: 93.9 fL (ref 78.0–100.0)
MONO ABS: 0.5 10*3/uL (ref 0.1–1.0)
MONOS PCT: 8 %
Neutro Abs: 3.5 10*3/uL (ref 1.7–7.7)
Neutrophils Relative %: 59 %
Platelets: 494 10*3/uL — ABNORMAL HIGH (ref 150–400)
RBC: 2.93 MIL/uL — ABNORMAL LOW (ref 3.87–5.11)
RDW: 15.9 % — AB (ref 11.5–15.5)
WBC: 5.8 10*3/uL (ref 4.0–10.5)

## 2017-09-16 LAB — GLUCOSE, CAPILLARY
GLUCOSE-CAPILLARY: 106 mg/dL — AB (ref 70–99)
GLUCOSE-CAPILLARY: 124 mg/dL — AB (ref 70–99)
Glucose-Capillary: 100 mg/dL — ABNORMAL HIGH (ref 70–99)
Glucose-Capillary: 97 mg/dL (ref 70–99)

## 2017-09-16 LAB — MAGNESIUM: MAGNESIUM: 2.3 mg/dL (ref 1.7–2.4)

## 2017-09-16 MED ORDER — METOCLOPRAMIDE HCL 5 MG/ML IJ SOLN: 10.0000 mg | Freq: Four times a day (QID) | INTRAMUSCULAR | Status: DC | PRN

## 2017-09-16 MED ORDER — METOCLOPRAMIDE HCL 5 MG/ML IJ SOLN
10.0000 mg | Freq: Four times a day (QID) | INTRAMUSCULAR | Status: DC | PRN
  Administered 2017-09-17 – 2017-09-19 (×2): 10 mg via INTRAVENOUS
  Filled 2017-09-16 (×2): qty 2

## 2017-09-16 MED ORDER — OXYCODONE HCL 5 MG PO TABS
5.0000 mg | ORAL_TABLET | ORAL | Status: DC | PRN
  Administered 2017-09-16 (×2): 5 mg via ORAL
  Administered 2017-09-17: 10 mg via ORAL
  Administered 2017-09-17: 5 mg via ORAL
  Filled 2017-09-16: qty 2
  Filled 2017-09-16: qty 1
  Filled 2017-09-16: qty 2
  Filled 2017-09-16 (×2): qty 1

## 2017-09-16 MED ORDER — POTASSIUM CHLORIDE CRYS ER 20 MEQ PO TBCR
40.0000 meq | EXTENDED_RELEASE_TABLET | Freq: Once | ORAL | Status: AC
  Administered 2017-09-16: 40 meq via ORAL
  Filled 2017-09-16: qty 2

## 2017-09-16 MED ORDER — ONDANSETRON HCL 4 MG/2ML IJ SOLN
4.0000 mg | Freq: Four times a day (QID) | INTRAMUSCULAR | Status: DC
  Filled 2017-09-16 (×3): qty 2

## 2017-09-16 NOTE — Progress Notes (Signed)
Patient ID: [name], female   DOB: 1960/12/27, 57 y.o.   MRN: [id number]  PROGRESS NOTE    VEGAS COFFIN  NWG:[id number] DOB: [birth year] DOA: 09/12/2017 PCP: Cassandria Anger, MD   Brief Narrative:  57 year old female with history of CAD, hypertension, diabetes, depression, DVT/PE on Xarelto, OSA, COPD on 2 L oxygen via nasal cannula and sarcoidosis on Humira presented with abdominal pain, nausea, vomiting and diarrhea for 2 weeks.  CT abdomen on admission showed acute cholecystitis with markedly distended gallbladder, adjacent intrahepatic and pericholecystic fluid concerning for abscess.  She was started on intravenous antibiotics and general surgery was consulted.  IR was also consulted and patient underwent percutaneous cholecystostomy tube placement by IR on 09/13/2017.   Assessment & Plan:   Principal Problem:   Cholecystitis Active Problems:   Major depressive disorder, recurrent episode ([place])   Essential hypertension   CAD in native artery   Controlled type 2 diabetes mellitus with chronic kidney disease, without long-term current use of insulin (HCC)   Pulmonary embolism (HCC)   OSA (obstructive sleep apnea)   Diastolic dysfunction   COPD (chronic obstructive pulmonary disease) ([place])   Sepsis ([place])   Sepsis secondary to cholecystitis -Antibiotic plan as below.  Follow cultures.  Currently hemodynamically stable.  Acute cholecystitis with possible intrahepatic and pericholecystic abscess with elevated LFTs -General surgery deemed that patient was not a surgical candidate.  Patient underwent IR guided percutaneous cholecystostomy placement on 09/13/2017.  Blood cultures negative so far.  Biliary culture is growing staph aureus, susceptibility is pending.  Continue broad-spectrum antibiotics. Pain management.  Follow with IR regarding management of cholecystostomy tube. -LFTs improving.   -Abdominal exam is improving but the patient is still nauseous and  intermittently vomiting.  Will use Zofran around-the-clock for the next 24 hours and add as needed Reglan.  If patient persistently vomits or abdominal pain gets worse, will have to get a repeat CAT scan of the abdomen to see the progression of cholecystitis with intrahepatic and pericholecystic abscess.  Leukocytosis -Probably secondary to above.  Resolved  Hypokalemia -Replace.  Repeat a.m.  Hypoalbuminemia -Probably secondary to poor oral intake.  Follow nutrition recommendations  History of DVT/PE -Continue Xarelto  COPD with chronic hypoxic respiratory failure -Currently stable.  Continue Dulera.  Continue as needed duo nebs.  Continue oxygen supplementation  History of coronary artery disease -Currently no chest pain.  Not on statin or aspirin.  Coreg on hold because of initial sepsis and hypotension  Diabetes mellitus type 2 -Oral meds on hold -Continue CBGs with sliding scale insulin  Chronic diastolic heart failure -Currently no signs of volume overload.  Strict input and output.  Daily weights.  Continue low-dose Lasix.  OSA -Does not use CPAP  Morbid obesity -Outpatient follow-up  Depression -Continue Effexor   DVT prophylaxis: Xarelto Code Status: Full Family Communication: None at bedside Disposition Plan: Depends on clinical outcome  Consultants: General surgery/IR  Procedures: Percutaneous cholecystostomy tube placement by IR on 09/13/2017  Antimicrobials: Zosyn and vancomycin from 09/12/2017 onwards.    Subjective: Patient seen and examined at bedside.  She still complains of intermittent nausea and vomiting.  Her appetite is poor.  Her abdominal pain is improving.  She is complaining of intermittent headaches.  Objective: Vitals:   09/15/17 1334 09/15/17 2109 09/16/17 0724 09/16/17 0822  BP: 125/61 130/71  130/83  Pulse: 70 71  65  Resp: 14 15    Temp: 98 F (36.7 C) 98 F (36.7 C)  TempSrc: Oral Oral    SpO2: 96% 95% 94% 98%  Weight:     125.8 kg  Height:        Intake/Output Summary (Last 24 hours) at 09/16/2017 0851 Last data filed at 09/16/2017 0600 Gross per 24 hour  Intake 1094.85 ml  Output 1275 ml  Net -180.15 ml   Filed Weights   09/12/17 2117 09/15/17 0900 09/16/17 4010  Weight: 120 kg 122.9 kg 125.8 kg    Examination:  General exam: Appears older than stated age.  Lying in bed in no distress Respiratory system: Bilateral decreased breath sounds at bases with some scattered crackles Cardiovascular system: Rate controlled, S1-S2 heard  gastrointestinal system: Abdomen is slightly distended, soft, tenderness has markedly improved.  No rebound tenderness. percutaneous cholecystostomy drain in right upper quadrant present normal bowel sounds heard. Extremities: No cyanosis, edema     Data Reviewed: I have personally reviewed following labs and imaging studies  CBC: Recent Labs  Lab 09/12/17 1510 09/13/17 0413 09/14/17 0436 09/15/17 0406 09/16/17 0455  WBC 21.9* 22.6* 10.5 7.7 5.8  NEUTROABS 19.0*  --  7.9* 5.3 3.5  HGB 9.6* 9.0* 8.1* 9.1* 8.2*  HCT 30.7* 29.8* 27.4* 30.7* 27.5*  MCV 90.8 92.8 93.2 93.6 93.9  PLT 349 419* 407* 493* 272*   Basic Metabolic Panel: Recent Labs  Lab 09/12/17 1510 09/13/17 0413 09/14/17 0436 09/15/17 0406 09/16/17 0455  NA 138 140 142 144 143  K 3.8 3.9 3.5 3.6 3.3*  CL 105 105 109 110 109  CO2 24 25 24 27 28   GLUCOSE 129* 137* 93 117* 109*  BUN 11 14 11 7  5*  CREATININE 1.19* 1.43* 1.11* 1.04* 0.96  CALCIUM 8.1* 8.4* 8.2* 8.2* 8.2*  MG  --   --  2.2 2.3 2.3   GFR: Estimated Creatinine Clearance: 89.1 mL/min (by C-G formula based on SCr of 0.96 mg/dL). Liver Function Tests: Recent Labs  Lab 09/12/17 1510 09/13/17 0413 09/14/17 0436 09/15/17 0406 09/16/17 0455  AST 148* 127* 40 23 20  ALT 94* 95* 57* 41 28  ALKPHOS 217* 215* 150* 136* 129*  BILITOT 3.3* 4.0* 1.1 0.8 0.6  PROT 7.1 7.3 6.3* 6.6 6.1*  ALBUMIN 2.3* 2.4* 2.0* 1.9* 1.9*   Recent Labs    Lab 09/12/17 1510  LIPASE 34   No results for input(s): AMMONIA in the last 168 hours. Coagulation Profile: No results for input(s): INR, PROTIME in the last 168 hours. Cardiac Enzymes: No results for input(s): CKTOTAL, CKMB, CKMBINDEX, TROPONINI in the last 168 hours. BNP (last 3 results) Recent Labs    11/11/16 1051  PROBNP 194.0*   HbA1C: No results for input(s): HGBA1C in the last 72 hours. CBG: Recent Labs  Lab 09/15/17 0747 09/15/17 1207 09/15/17 1723 09/15/17 2300 09/16/17 0728  GLUCAP 110* 135* 123* 126* 106*   Lipid Profile: No results for input(s): CHOL, HDL, LDLCALC, TRIG, CHOLHDL, LDLDIRECT in the last 72 hours. Thyroid Function Tests: No results for input(s): TSH, T4TOTAL, FREET4, T3FREE, THYROIDAB in the last 72 hours. Anemia Panel: No results for input(s): VITAMINB12, FOLATE, FERRITIN, TIBC, IRON, RETICCTPCT in the last 72 hours. Sepsis Labs: Recent Labs  Lab 09/12/17 1524 09/12/17 1814  LATICACIDVEN 1.05 0.68    Recent Results (from the past 240 hour(s))  Blood Culture (routine x 2)     Status: None (Preliminary result)   Collection Time: 09/12/17  6:07 PM  Result Value Ref Range Status   Specimen Description   Final  BLOOD RIGHT ARM Performed at [facility], [place] [address] [place], [place] [address]    Special Requests   Final    BOTTLES DRAWN AEROBIC AND ANAEROBIC Blood Culture adequate volume Performed at [place] [address], [place], [place] [address]    Culture   Final    NO GROWTH 3 DAYS Performed at [facility], [place] [address], [place], [place] [address]    Report Status PENDING  Incomplete  Blood Culture (routine x 2)     Status: None (Preliminary result)   Collection Time: 09/12/17  6:07 PM  Result Value Ref Range Status   Specimen Description   Final    BLOOD RIGHT ANTECUBITAL Performed at [facility] [address], [place], [place] [address]     Special Requests   Final    BOTTLES DRAWN AEROBIC AND ANAEROBIC Blood Culture adequate volume Performed at [facility] [address] [place], [place] [address]    Culture   Final    NO GROWTH 3 DAYS Performed at [facility], [place] [address] [place], [place] [address]    Report Status PENDING  Incomplete  Surgical PCR screen     Status: Abnormal   Collection Time: 09/13/17  1:47 AM  Result Value Ref Range Status   MRSA, PCR NEGATIVE NEGATIVE Final   Staphylococcus aureus POSITIVE (A) NEGATIVE Final    Comment: (NOTE) The Xpert SA Assay (FDA approved for NASAL specimens in patients 17 years of age and older), is one component of a comprehensive surveillance program. It is not intended to diagnose infection nor to guide or monitor treatment. Performed at [facility], [place] [address], [place], [place] [address]   Aerobic/Anaerobic Culture (surgical/deep wound)     Status: None (Preliminary result)   Collection Time: 09/13/17  2:11 PM  Result Value Ref Range Status   Specimen Description   Final    ABSCESS GALL BLADDER Performed at [facility] [address] [place], [place] [address]    Special Requests   Final    Normal Performed at [facility], [place] [address], [place], [place] [address]    Gram Stain   Final    FEW WBC PRESENT, PREDOMINANTLY PMN MODERATE GRAM POSITIVE COCCI IN CLUSTERS Performed at [facility], [place] [address], [place],  [address]    Culture   Final    FEW STAPHYLOCOCCUS AUREUS SUSCEPTIBILITIES TO FOLLOW NO ANAEROBES ISOLATED; CULTURE IN PROGRESS FOR 5 DAYS    Report Status PENDING  Incomplete         Radiology Studies: Dg Abd Portable 2v  Result Date: 09/15/2017 CLINICAL DATA:  57 year old female with increasing generalized abdominal pain EXAM: PORTABLE ABDOMEN - 2 VIEW COMPARISON:  CT abdomen/pelvis 09/12/2017 FINDINGS: No evidence of free air on  the lateral decubitus radiographs. The previously placed percutaneous cholecystostomy tube remains in unchanged position overlying the expected location of the gallbladder. The bowel gas pattern is not obstructed. No abnormal calcifications. No acute osseous abnormality. IMPRESSION: 1. Well-positioned percutaneous cholecystostomy tube. 2. No evidence of free air or bowel obstruction. Electronically Signed   By: Jacqulynn Cadet M.D.   On: 09/15/2017 09:25        Scheduled Meds: . feeding supplement  1 Container Oral TID BM  . furosemide  20 mg Oral Daily  . insulin aspart  0-15 Units Subcutaneous TID WC  . insulin aspart  0-5 Units Subcutaneous QHS  . mometasone-formoterol  2 puff Inhalation BID  . pantoprazole  40 mg Oral Daily  . potassium chloride  40 mEq Oral Once  . rivaroxaban  20 mg Oral Q supper  . sodium chloride flush  5 mL Intracatheter Q8H  . topiramate  50 mg Oral BID  . venlafaxine XR  75 mg Oral Q breakfast   Continuous Infusions: . sodium chloride 10 mL/hr at 09/15/17 0920  . piperacillin-tazobactam (ZOSYN)  IV Stopped (09/16/17 0307)  . vancomycin Stopped (09/15/17 2217)     LOS: 4 days        Aline August, MD [facility] Pager [phone]  If 7PM-7AM, please contact night-coverage [email or web address] Password [email or web address] 09/16/2017, 8:51 AM

## 2017-09-16 NOTE — Consult Note (Signed)
   [facility] CM Inpatient Consult   09/16/2017  Alyannah Sanks [facility] 1960-10-07 [id number]    Ms. Knippel is active with [facility] program. She is followed by [facility] Worker for transportation needs. Please see chart review tab then encounters for patient outreach details.   Went to bedside to speak with Ms. [place] about ongoing [facility] services and [place] follow up. She is agreeable. Active [facility] consent remains on file.   Explained that [facility] will not interfere or replace services provided by home health.  Confirmed Primary Care MD is Dr. Alain Marion (Shelba Flake listed as doing transition of care calls).   Ms. Betke has history of CAD, HTN, DM, DVT/PE, OSA, COPD on home o2.   Will make referral to [facility].  Made inpatient RNCM aware [facility] is active.    Marthenia Rolling, MSN-Ed, RN,BSN [facility] Liaison [phone]

## 2017-09-16 NOTE — Progress Notes (Signed)
Physical Therapy Treatment Patient Details Name: [name] MRN: [id number] DOB: 1960-09-21 Today's Date: 09/16/2017    History of Present Illness 57 year old female with history of CAD, hypertension, diabetes, depression, DVT/PE, OSA, COPD on 2 L O2 and sarcoidosis Pt  presented with abdominal pain, nausea, vomiting and diarrhea.   CT abdomen showed acute cholecystitis with markedly distended gallbladder, adjacent intrahepatic and pericholecystic fluid concerning for abscess.  She was started on intravenous antibiotics and general surgery and  IR consulted and patient underwent percutaneous cholecystostomy tube placement by IR on 09/13/2017.    PT Comments    Pt progressing toward PT goals, incr gait distance/tolerance  today; continue PT POC   Follow Up Recommendations  Home health PT;Supervision - Intermittent     Equipment Recommendations  None recommended by PT    Recommendations for Other Services       Precautions / Restrictions Precautions Precautions: Fall Precaution Comments: O2 dependent Restrictions Weight Bearing Restrictions: No    Mobility  Bed Mobility Overal bed mobility: Modified Independent             General bed mobility comments: incr time, no assist  Transfers Overall transfer level: Needs assistance Equipment used: Rolling walker (2 wheeled) Transfers: Sit to/from Stand Sit to Stand: Min guard;Supervision         General transfer comment: min/guard for safety,cues for hand placement and to control descent  Ambulation/Gait Ambulation/Gait assistance: Min guard;Supervision Gait Distance (Feet): 92 Feet Assistive device: Rolling walker (2 wheeled) Gait Pattern/deviations: Step-through pattern;Decreased stride length;Trunk flexed     General Gait Details: cues for posture and RW position; HR 81, SpO2 = 91-93% on 2L   Stairs             Wheelchair Mobility    Modified Rankin (Stroke Patients Only)       Balance              Standing balance-Leahy Scale: Fair Standing balance comment: reliant on UE support fro dynamic                            Cognition Arousal/Alertness: Awake/alert Behavior During Therapy: WFL for tasks assessed/performed Overall Cognitive Status: Within Functional Limits for tasks assessed                                        Exercises      General Comments        Pertinent Vitals/Pain Pain Assessment: No/denies pain    Home Living                      Prior Function            PT Goals (current goals can now be found in the care plan section) Acute Rehab PT Goals Patient Stated Goal: home soon PT Goal Formulation: With patient Time For Goal Achievement: 09/29/17 Potential to Achieve Goals: Good Progress towards PT goals: Progressing toward goals    Frequency    Min 3X/week      PT Plan Current plan remains appropriate    Co-evaluation              AM-PAC PT "6 Clicks" Daily Activity  Outcome Measure  Difficulty turning over in bed (including adjusting bedclothes, sheets and blankets)?: A Lot Difficulty moving from lying on back to sitting  on the side of the bed? : A Lot Difficulty sitting down on and standing up from a chair with arms (e.g., wheelchair, bedside commode, etc,.)?: A Little Help needed moving to and from a bed to chair (including a wheelchair)?: A Little Help needed walking in hospital room?: A Little Help needed climbing 3-5 steps with a railing? : A Little 6 Click Score: 16    End of Session Equipment Utilized During Treatment: Gait belt;Oxygen Activity Tolerance: Patient tolerated treatment well Patient left: in chair;with call bell/phone within reach;with family/visitor present   PT Visit Diagnosis: Difficulty in walking, not elsewhere classified (R26.2);Muscle weakness (generalized) (M62.81)     Time: 7371-0626 PT Time Calculation (min) (ACUTE ONLY): 23 min  Charges:   $Gait Training: 23-37 mins                     Kenyon Ana, [place] Pager: [phone] 09/16/2017   [facility] Acute [facility]hab Dept [phone]    [facility] 09/16/2017, 10:20 AM

## 2017-09-16 NOTE — Progress Notes (Addendum)
Referring Physician(s): Dr. Leighton Ruff  Supervising Physician: Corrie Mckusick  Patient Status:  [facility] - In-pt  Chief Complaint: Follow up percutaneous cholecystostomy.   Subjective:  57 y/o F with PMH of CAD, HTN, DM, DVT/PE on Xarelto, OSA, COPD, sarcoidosis who presented to ED on 8/30 with complaints of progressive RUQ pain x 2 weeks. [place] at the time showed stones and wall thickening consistent with cholecystitis. She was started on IV antibiotics and evaluated by general surgery who felt that patient was too high risk for surgery at that time - IR was consulted for percutaneous cholecystostomy placement which was performed on 8/31 by Dr. Laurence Ferrari.   Since cholecystostomy blood cultures have been negative, biliary culture growing staph aureus. WBC has normalized, LFTs improved, she is afebrile. She remains on broad-spectrum antibiotics.  Patient continues to report poor appetite, intermittent n/v however abdominal pain is improving overall. She also states ongoing diarrhea with most recent BM this morning - however she says this is "how it's always been."   Allergies: Belsomra [suvorexant]; Enalapril maleate; Latex; Nickel; Other; Iodine; Doxycycline; Hydrochlorothiazide; Hydroxychloroquine sulfate; Lyrica [pregabalin]; and Tape  Medications: Prior to Admission medications   Medication Sig Start Date End Date Taking? Authorizing Provider  Adalimumab (HUMIRA) 40 MG/0.8ML PSKT Inject 40 mg into the skin every 7 (seven) days.    Yes [provider]  budesonide-formoterol (SYMBICORT) 160-4.5 MCG/ACT inhaler Inhale 2 puffs into the lungs TWICE DAILY 120/4=30 06/10/17  Yes Rigoberto Noel, MD  carvedilol (COREG) 12.5 MG tablet Take 1 tablet (12.5 mg total) by mouth 2 (two) times daily with a meal. 06/12/17  Yes Plotnikov, Evie Lacks, MD  gabapentin (NEURONTIN) 300 MG capsule Take 1 capsule (300 mg total) by mouth 2 (two) times daily as needed (for pain). 06/12/17  Yes Plotnikov,  Evie Lacks, MD  potassium chloride (K-DUR,KLOR-CON) 10 MEQ tablet Take 10 mEq by mouth daily.  09/08/17  Yes [provider]  XARELTO 20 MG TABS tablet Take 1 tablet (20 mg total) by mouth at bedtime. 06/12/17  Yes Plotnikov, Evie Lacks, MD  clonazePAM (KLONOPIN) 0.5 MG tablet Take 1-2 tablets (0.5-1 mg total) by mouth at bedtime. 08/04/17   Plotnikov, Evie Lacks, MD  esomeprazole (NEXIUM) 40 MG capsule Take 1 capsule (40 mg total) by mouth daily before breakfast. 08/07/17   Plotnikov, Evie Lacks, MD  furosemide (LASIX) 40 MG tablet TAKE 1 TABLET BY MOUTH EVERY MORNING FOR EDEMA Patient taking differently: Take 20 mg by mouth daily as needed for fluid or edema.  01/10/17   Plotnikov, Evie Lacks, MD  glucose blood (EZ SMART PLUS GLUCOSE TEST) test strip Use as instructed to test sugars 4 times daily DX: E11.8 09/11/17   Philemon Kingdom, MD  JANUVIA 50 MG tablet Take 1 tablet (50 mg total) by mouth daily. Patient taking differently: Take 50 mg by mouth daily.  09/10/17   Philemon Kingdom, MD  Lancets (ACCU-CHEK MULTICLIX) lancets Use as instructed to check sugars 4 times daily DX: E11.8 06/30/17   Philemon Kingdom, MD  metFORMIN (GLUCOPHAGE-XR) 500 MG 24 hr tablet TAKE 2 TABLETS(1000 MG) BY MOUTH DAILY WITH BREAKFAST 06/12/17   Plotnikov, Evie Lacks, MD  promethazine (PHENERGAN) 25 MG tablet Take 1 tablet (25 mg total) by mouth every 8 (eight) hours as needed for nausea or vomiting. 09/03/17   Plotnikov, Evie Lacks, MD  topiramate (TOPAMAX) 50 MG tablet Take 1 tablet (50 mg total) by mouth 2 (two) times daily. 06/12/17   Plotnikov, Evie Lacks,  MD  venlafaxine XR (EFFEXOR-XR) 75 MG 24 hr capsule Take 1 capsule (75 mg total) by mouth daily with breakfast. 03/04/17   Plotnikov, Evie Lacks, MD  VENTOLIN HFA 108 (90 Base) MCG/ACT inhaler Inhale 2 puffs into the lungs every 6 (six) hours as needed for wheezing or shortness of breath. 200/8=25 Patient taking differently: Inhale 2 puffs into the lungs every 6 (six)  hours as needed for wheezing or shortness of breath.  12/11/16   Plotnikov, Evie Lacks, MD  VOLTAREN 1 % GEL Apply 2 g topically 2 (two) times daily as needed (for pain). 100/4=25 Patient taking differently: Apply 4 g topically 4 (four) times daily as needed (pain).  04/29/17   Plotnikov, Evie Lacks, MD     Vital Signs: BP 130/83 (BP Location: Right Arm)   Pulse 65   Temp 98 F (36.7 C) (Oral)   Resp 15   Ht 5\' 7"  (1.702 m)   Wt 277 lb 5.4 oz (125.8 kg)   SpO2 98%   BMI 43.44 kg/m   Physical Exam  Constitutional: She is oriented to person, place, and time. No distress.  HENT:  Head: Normocephalic.  Pulmonary/Chest: Effort normal.  Abdominal: Soft. She exhibits no distension. There is tenderness (minimal at insertion site).  Bilious output from cholecystostomy, insertion site unremarkable.   Neurological: She is alert and oriented to person, place, and time.  Skin: Skin is warm and dry. She is not diaphoretic.  Psychiatric: She has a normal mood and affect. Her behavior is normal. Judgment and thought content normal.  Nursing note and vitals reviewed.   Imaging: Dg Chest 2 View  Result Date: 09/12/2017 CLINICAL DATA:  Productive cough and chills EXAM: CHEST - 2 VIEW COMPARISON:  06/02/2017 FINDINGS: Cardiac enlargement with mild vascular congestion. Negative for edema or effusion. Negative for pneumonia IMPRESSION: Cardiac enlargement and mild vascular congestion. Negative for pneumonia or edema. Electronically Signed   By: Franchot Gallo M.D.   On: 09/12/2017 18:01   Ct Abdomen Pelvis W Contrast  Result Date: 09/12/2017 CLINICAL DATA:  Acute generalized abdominal pain x2 weeks with diarrhea and weight loss. EXAM: CT ABDOMEN AND PELVIS WITH CONTRAST TECHNIQUE: Multidetector CT imaging of the abdomen and pelvis was performed using the standard protocol following bolus administration of intravenous contrast. CONTRAST:  118mL ISOVUE-300 IOPAMIDOL (ISOVUE-300) INJECTION 61% COMPARISON:   None. FINDINGS: Lower chest: Heart size is normal. Minimal atelectatic subpleural nodular density in the left lower lobe on axial series 6/11 most likely represents a small focus of atelectasis given its more band like appearance on coronal reformats. No effusion or pulmonary consolidation. Hepatobiliary: Distended gallbladder with transmural thickening and pericholecystic fluid consistent with acute cholecystitis is identified. Complicating this finding are areas of low attenuation involving the adjacent liver and gallbladder fossa with mild peripheral enhancement concerning for adjacent multifocal intrahepatic abscesses. A gallstone is identified. The common duct is normal. Otherwise, no enhancing mass of the liver is seen. Pancreas: No pancreatic ductal dilatation, enhancing mass or inflammation. Spleen: Normal size spleen. Adrenals/Urinary Tract: Normal bilateral adrenal glands. Symmetric enhancement of the kidneys with small cyst arising off the interpolar right kidney measuring 13 mm. No hydroureteronephrosis. The urinary bladder is unremarkable. Stomach/Bowel: Small hiatal hernia. Decompressed stomach with normal small bowel rotation. No small-bowel obstruction. The distal and terminal ileum are normal. Patient is status post appendectomy. Sympathetic thickening of the colon traversing the right upper quadrant and mid upper abdomen is identified secondary to the inflamed gallbladder. Otherwise, there is scattered  colonic diverticulosis along the descending colon with greater degree of sigmoid diverticulosis and circular muscle hypertrophy identified. Vascular/Lymphatic: Aortoiliac atherosclerosis. No pathologically enlarged lymph nodes. Small subcentimeter short axis periportal lymph nodes likely reactive in etiology are noted. Reproductive: The uterus and adnexa are unremarkable. Other: No free air free fluid. Musculoskeletal: No acute nor aggressive osseous abnormality. Degenerative changes are present  along the dorsal spine. IMPRESSION: Acute cholecystitis with markedly distended gallbladder, transmural thickening and pericholecystic inflammation identified. Complicating this finding are adjacent intrahepatic and pericholecystic fluid attenuating hypodensities with slight peripheral enhancement compatible with adjacent abscesses some which appear to be intrahepatic subjacent to the gallbladder fossa. 13 mm cyst arising from the interpolar right without worrisome features. Colonic diverticulosis without acute diverticulitis. Electronically Signed   By: Ashley Royalty M.D.   On: 09/12/2017 17:23   Ir Perc Cholecystostomy  Result Date: 09/13/2017 INDICATION: 57 year old female with acute cholecystitis. She is a poor operative candidate and presents for percutaneous cholecystostomy tube placement. EXAM: CHOLECYSTOSTOMY MEDICATIONS: Patient is on intravenous antibiotics as an inpatient. No additional prophylaxis administered. ANESTHESIA/SEDATION: Moderate (conscious) sedation was employed during this procedure. A total of Versed 2 mg and Fentanyl 50 mcg was administered intravenously. Moderate Sedation Time: 12 minutes. The patient's level of consciousness and vital signs were monitored continuously by radiology nursing throughout the procedure under my direct supervision. FLUOROSCOPY TIME:  Fluoroscopy Time: 0 minutes 53 seconds (54 mGy). COMPLICATIONS: None immediate. PROCEDURE: Informed written consent was obtained from the patient after a thorough discussion of the procedural risks, benefits and alternatives. All questions were addressed. Maximal Sterile Barrier Technique was utilized including caps, mask, sterile gowns, sterile gloves, sterile drape, hand hygiene and skin antiseptic. A timeout was performed prior to the initiation of the procedure. Ultrasound was used to interrogate the right upper quadrant. The grossly inflamed and irregular gallbladder is successfully identified. A suitable skin entry site was  selected and local anesthesia attained by infiltration with 1% lidocaine. A small dermatotomy was made. Under real-time sonographic guidance, a 21 gauge Accustick needle was advanced through the skin, through a short transhepatic course and into the lumen of the gallbladder. The 0.018 wire was then coiled in the distended gallbladder lumen. The Accustick needle was exchanged for the Accustick sheath. A gentle hand injection of contrast material was performed opacifying the gallbladder lumen. A 75 cm superstiff Amplatz wire was then advanced and coiled in the gallbladder lumen. The Accustick sheath was removed. The soft tissue tract was dilated to 10 [place] and a Cook 10.2 [place] all-purpose drainage catheter was advanced over the wire and formed in the gallbladder. There was return of foul-smelling milky bile. A sample was collected and sent for culture. A final gentle hand injection of contrast material was performed and a fluoroscopic spot image was saved to document the position of the drainage catheter. The tube was connected to gravity bag drainage and secured to the skin with 0 Prolene suture. The patient tolerated the procedure well. IMPRESSION: Successful placement of a 10 French transhepatic percutaneous cholecystostomy tube for acute cholecystitis. Electronically Signed   By: Jacqulynn Cadet M.D.   On: 09/13/2017 14:22   Dg Abd Portable 2v  Result Date: 09/15/2017 CLINICAL DATA:  56 year old female with increasing generalized abdominal pain EXAM: PORTABLE ABDOMEN - 2 VIEW COMPARISON:  CT abdomen/pelvis 09/12/2017 FINDINGS: No evidence of free air on the lateral decubitus radiographs. The previously placed percutaneous cholecystostomy tube remains in unchanged position overlying the expected location of the gallbladder. The bowel gas pattern  is not obstructed. No abnormal calcifications. No acute osseous abnormality. IMPRESSION: 1. Well-positioned percutaneous cholecystostomy tube. 2. No evidence of  free air or bowel obstruction. Electronically Signed   By: Jacqulynn Cadet M.D.   On: 09/15/2017 09:25   US Abdomen Limited Ruq  Result Date: 09/13/2017 CLINICAL DATA:  57 year old female with right upper quadrant abdominal pain x3 days. EXAM: ULTRASOUND ABDOMEN LIMITED RIGHT UPPER QUADRANT COMPARISON:  CT of the abdomen pelvis dated 09/12/2017 FINDINGS: Gallbladder: The gallbladder is distended. There gallbladder wall is irregular with multiple outpouchings. The gallbladder wall is thickened and edematous measuring 12 mm in thickness. There is sludge within the gallbladder. There is a small amount of pericholecystic fluid. Positive sonographic Murphy's sign. There is a 7.5 x 4.8 x 6.2 cm complex area adjacent to the gallbladder which may represent an abscess or a contained perforation. Common bile duct: Diameter: 4 mm Liver: There is diffuse increased liver echogenicity most consistent with fatty infiltration. Portal vein is patent on color Doppler imaging with normal direction of blood flow towards the liver. IMPRESSION: 1. Gallbladder sludge with findings of acute cholecystitis. Complex collection adjacent to the gallbladder may represent abscess or focally contained perforation. 2. Fatty liver. 3. Patent main portal vein with hepatopetal flow. Electronically Signed   By: Anner Crete M.D.   On: 09/13/2017 00:12    Labs:  CBC: Recent Labs    09/13/17 0413 09/14/17 0436 09/15/17 0406 09/16/17 0455  WBC 22.6* 10.5 7.7 5.8  HGB 9.0* 8.1* 9.1* 8.2*  HCT 29.8* 27.4* 30.7* 27.5*  PLT 419* 407* 493* 494*    COAGS: Recent Labs    06/26/17 0751 09/13/17 0413 09/13/17 1211 09/13/17 2150 09/14/17 0841  INR 0.93  --   --   --   --   APTT  --  76* 75* 67* 96*    BMP: Recent Labs    09/13/17 0413 09/14/17 0436 09/15/17 0406 09/16/17 0455  NA 140 142 144 143  K 3.9 3.5 3.6 3.3*  CL 105 109 110 109  CO2 25 24 27 28   GLUCOSE 137* 93 117* 109*  BUN 14 11 7  5*  CALCIUM 8.4* 8.2*  8.2* 8.2*  CREATININE 1.43* 1.11* 1.04* 0.96  GFRNONAA 40* 54* 58* >60  GFRAA 46* >60 >60 >60    LIVER FUNCTION TESTS: Recent Labs    09/13/17 0413 09/14/17 0436 09/15/17 0406 09/16/17 0455  BILITOT 4.0* 1.1 0.8 0.6  AST 127* 40 23 20  ALT 95* 57* 41 28  ALKPHOS 215* 150* 136* 129*  PROT 7.3 6.3* 6.6 6.1*  ALBUMIN 2.4* 2.0* 1.9* 1.9*    Assessment and Plan:  Cholecystitis s/p percutaneous cholecystostomy 8/31 with Dr. Laurence Ferrari - ongoing n/v/d, abdominal pain improved. Patient states she does not believe drain was flushed yesterday but is unsure, states has been flushed all other days - 425 cc output in last 24 hours, 200 cc recorded this AM as well.   WBC normalized, LFTs improving, afebrile, blood cultures negative, biliary cultures (+) staph aureus. Internal medicine scheduling zofran and beginning PRN Reglan for continued n/v with plan to repeat CT abd/pelvis if not improved.   Continue BID flushes with 3-5 mL NS, record output. IR will continue to follow while inpatient - discharge per admitting service with outpatient follow up in IR clinic 1-2 weeks after d/c.  Electronically Signed: Joaquim Nam, PA-C 09/16/2017, 9:44 AM   I spent a total of 15 Minutes at the the patient's bedside AND on the  patient's hospital floor or unit, greater than 50% of which was counseling/coordinating care for cholecystostomy.

## 2017-09-17 DIAGNOSIS — A419 Sepsis, unspecified organism: Secondary | ICD-10-CM

## 2017-09-17 DIAGNOSIS — J9611 Chronic respiratory failure with hypoxia: Secondary | ICD-10-CM

## 2017-09-17 DIAGNOSIS — F339 Major depressive disorder, recurrent, unspecified: Secondary | ICD-10-CM

## 2017-09-17 DIAGNOSIS — G43909 Migraine, unspecified, not intractable, without status migrainosus: Secondary | ICD-10-CM

## 2017-09-17 DIAGNOSIS — D860 Sarcoidosis of lung: Secondary | ICD-10-CM

## 2017-09-17 DIAGNOSIS — I2782 Chronic pulmonary embolism: Secondary | ICD-10-CM

## 2017-09-17 DIAGNOSIS — N182 Chronic kidney disease, stage 2 (mild): Secondary | ICD-10-CM

## 2017-09-17 DIAGNOSIS — I10 Essential (primary) hypertension: Secondary | ICD-10-CM

## 2017-09-17 LAB — CBC WITH DIFFERENTIAL/PLATELET
BASOS ABS: 0 10*3/uL (ref 0.0–0.1)
Basophils Relative: 1 %
Eosinophils Absolute: 0.2 10*3/uL (ref 0.0–0.7)
Eosinophils Relative: 3 %
HEMATOCRIT: 29.3 % — AB (ref 36.0–46.0)
Hemoglobin: 8.7 g/dL — ABNORMAL LOW (ref 12.0–15.0)
LYMPHS ABS: 2.1 10*3/uL (ref 0.7–4.0)
LYMPHS PCT: 33 %
MCH: 27.9 pg (ref 26.0–34.0)
MCHC: 29.7 g/dL — ABNORMAL LOW (ref 30.0–36.0)
MCV: 93.9 fL (ref 78.0–100.0)
MONO ABS: 0.5 10*3/uL (ref 0.1–1.0)
Monocytes Relative: 8 %
NEUTROS ABS: 3.6 10*3/uL (ref 1.7–7.7)
Neutrophils Relative %: 55 %
PLATELETS: 549 10*3/uL — AB (ref 150–400)
RBC: 3.12 MIL/uL — AB (ref 3.87–5.11)
RDW: 16 % — ABNORMAL HIGH (ref 11.5–15.5)
WBC: 6.4 10*3/uL (ref 4.0–10.5)

## 2017-09-17 LAB — COMPREHENSIVE METABOLIC PANEL
ALT: 26 U/L (ref 0–44)
AST: 17 U/L (ref 15–41)
Albumin: 2 g/dL — ABNORMAL LOW (ref 3.5–5.0)
Alkaline Phosphatase: 125 U/L (ref 38–126)
Anion gap: 8 (ref 5–15)
BUN: <5 mg/dL — ABNORMAL LOW (ref 6–20)
CHLORIDE: 108 mmol/L (ref 98–111)
CO2: 30 mmol/L (ref 22–32)
Calcium: 8.4 mg/dL — ABNORMAL LOW (ref 8.9–10.3)
Creatinine, Ser: 1.17 mg/dL — ABNORMAL HIGH (ref 0.44–1.00)
GFR calc non Af Amer: 51 mL/min — ABNORMAL LOW (ref >60)
GFR, EST AFRICAN AMERICAN: 59 mL/min — AB (ref >60)
Glucose, Bld: 92 mg/dL (ref 70–99)
Potassium: 3.8 mmol/L (ref 3.5–5.1)
SODIUM: 146 mmol/L — AB (ref 135–145)
Total Bilirubin: 0.5 mg/dL (ref 0.3–1.2)
Total Protein: 6.2 g/dL — ABNORMAL LOW (ref 6.5–8.1)

## 2017-09-17 LAB — CULTURE, BLOOD (ROUTINE X 2)
CULTURE: NO GROWTH
CULTURE: NO GROWTH
SPECIAL REQUESTS: ADEQUATE
Special Requests: ADEQUATE

## 2017-09-17 LAB — GLUCOSE, CAPILLARY
GLUCOSE-CAPILLARY: 87 mg/dL (ref 70–99)
GLUCOSE-CAPILLARY: 96 mg/dL (ref 70–99)
Glucose-Capillary: 81 mg/dL (ref 70–99)
Glucose-Capillary: 118 mg/dL — ABNORMAL HIGH (ref 70–99)

## 2017-09-17 LAB — MAGNESIUM: MAGNESIUM: 2.2 mg/dL (ref 1.7–2.4)

## 2017-09-17 MED ORDER — VANCOMYCIN HCL 10 G IV SOLR
1250.0000 mg | INTRAVENOUS | Status: DC
  Administered 2017-09-17 – 2017-09-18 (×2): 1250 mg via INTRAVENOUS
  Filled 2017-09-17 (×2): qty 1250

## 2017-09-17 NOTE — Plan of Care (Signed)
Plan of care discussed with patient 

## 2017-09-17 NOTE — Progress Notes (Addendum)
PROGRESS NOTE    [name]   NWG:[id number]  DOB: 04/03/1960  DOA: 09/12/2017 PCP: Cassandria Anger, MD   Brief Narrative:  [name] is a 57 y.o. 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 ED> vital signs: fever to 100.5, BP 90s/50s  CT abdomen > acute cholecystitis and markedly distended gallbladder, adjacent intrahepatic and pericholecystic fluid concerning for abscess WBC 21.9  Subjective: She states her pain in the right upper abdomen has improved (as long as she receives pain medications we have her on). She is mildly nauseated but this is better than before. Oral intake is still poor.  Assessment & Plan:   Principal Problem:   Cholecystitis with possible intrahepatic and pericholecystic abscess Sepsis - immune compromised due to Humira use - gen surgery has decided that due to her pulmonary sarcoid, she is not a good candidate for surgery at this time- gen surgery signed off on 9/4 - perc drain placed on 8/31 by IR - few staph aureus growing in biliary fluid culture- MRSA PCR +  blood cultures NGTD - cont to control pain and nausea with medications- encourage oral intake- add Ensure - IR recommends f/u in drain clinic in 2-4 wks, once daily irrigation of drain with 5-10 cc sterile NS, recording of output and dressing changes every 1-2 days;   - cont Vanc and Zosyn for now- no susceptibilities available on culture yet- (I have spent 30 min with speaking to the lab today) - I have asked ID for further assistance  Active Problems: 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 Chronic hypoxic respiratory failure on 2 L O2 at baseline due to sarcoidosis - managed by Dr Elsworth Soho     Major depressive disorder, recurrent episode   - cont Effexor  and Clonazepam    Essential hypertension/ mild LVH and EF 60-65% on ECHO - continue coreg - cont to hold Lasix as Cr elevated from baseline- not fluid overlaoded     Controlled type 2 diabetes mellitus  - Januvia and Metformin on hold- cont SSI- sugars stable  CKD 2-3   - stable     Pulmonary embolism / DVT history - cont Xarelto     DVT prophylaxis: Xarelto Code Status: Full code Family Communication:  Disposition Plan: home health PT recommended and ordered Consultants:   IR  Gen surgery   ID Procedures:   Percutaneous drain in RUQ/ Gallbladder  Antimicrobials:  Anti-infectives (From admission, onward)   Start     Dose/Rate Route Frequency Ordered Stop   09/13/17 2200  vancomycin (VANCOCIN) 1,250 mg in sodium chloride 0.9 % 250 mL IVPB  Status:  Discontinued     1,250 mg 166.7 mL/hr over 90 Minutes Intravenous Every 24 hours 09/12/17 2131 09/13/17 1130   09/13/17 2200  vancomycin (VANCOCIN) IVPB 1000 mg/200 mL premix     1,000 mg 200 mL/hr over 60 Minutes Intravenous Every 24 hours 09/13/17 1130     09/13/17 0000  piperacillin-tazobactam (ZOSYN) IVPB 3.375 g     3.375 g 12.5 mL/hr over 240 Minutes Intravenous Every 8 hours 09/12/17 1938     09/12/17 2200  vancomycin (VANCOCIN) 2,000 mg in sodium chloride 0.9 % 500 mL IVPB     2,000 mg 250 mL/hr over 120 Minutes Intravenous  Once 09/12/17 2107 09/13/17 0053  09/12/17 1745  piperacillin-tazobactam (ZOSYN) IVPB 3.375 g     3.375 g 100 mL/hr over 30 Minutes Intravenous  Once 09/12/17 1739 09/12/17 1841       Objective: Vitals:   09/16/17 1728 09/16/17 1909 09/16/17 2115 09/17/17 0516  BP: (!) 155/81  128/62 133/70  Pulse: 81  78 67  Resp: 16  16 14   Temp: 97.7 F (36.5 C)  98.1 F (36.7 C) 97.9 F (36.6 C)  TempSrc: Oral  Oral Oral  SpO2: 95% 95% 96% 97%  Weight:    123.7 kg  Height:        Intake/Output Summary (Last 24 hours) at 09/17/2017 0834 Last data filed at 09/17/2017 3710 Gross per 24 hour    Intake 1376.78 ml  Output 2815 ml  Net -1438.22 ml   Filed Weights   09/15/17 0900 09/16/17 0822 09/17/17 0516  Weight: 122.9 kg 125.8 kg 123.7 kg    Examination: General exam: Appears comfortable  HEENT: PERRLA, oral mucosa moist, no sclera icterus or thrush Respiratory system: Clear to auscultation. Respiratory effort normal. Cardiovascular system: S1 & S2 heard, RRR.   Gastrointestinal system: Abdomen soft, much less Tender in RUL today- no longer tender in epigastrium and mid abdomen, nondistended. Normal bowel sound. No organomegaly- RUQ drain with large amount of brown liquid noted Central nervous system: Alert and oriented. No focal neurological deficits. Extremities: No cyanosis, clubbing or edema Skin: No rashes or ulcers Psychiatry:  Mood & affect appropriate.     Data Reviewed: I have personally reviewed following labs and imaging studies  CBC: Recent Labs  Lab 09/12/17 1510 09/13/17 0413 09/14/17 0436 09/15/17 0406 09/16/17 0455 09/17/17 0509  WBC 21.9* 22.6* 10.5 7.7 5.8 6.4  NEUTROABS 19.0*  --  7.9* 5.3 3.5 3.6  HGB 9.6* 9.0* 8.1* 9.1* 8.2* 8.7*  HCT 30.7* 29.8* 27.4* 30.7* 27.5* 29.3*  MCV 90.8 92.8 93.2 93.6 93.9 93.9  PLT 349 419* 407* 493* 494* 626*   Basic Metabolic Panel: Recent Labs  Lab 09/13/17 0413 09/14/17 0436 09/15/17 0406 09/16/17 0455 09/17/17 0509  NA 140 142 144 143 146*  K 3.9 3.5 3.6 3.3* 3.8  CL 105 109 110 109 108  CO2 25 24 27 28 30   GLUCOSE 137* 93 117* 109* 92  BUN 14 11 7  5* <5*  CREATININE 1.43* 1.11* 1.04* 0.96 1.17*  CALCIUM 8.4* 8.2* 8.2* 8.2* 8.4*  MG  --  2.2 2.3 2.3 2.2   GFR: Estimated Creatinine Clearance: 72.4 mL/min (A) (by C-G formula based on SCr of 1.17 mg/dL (H)). Liver Function Tests: Recent Labs  Lab 09/13/17 0413 09/14/17 0436 09/15/17 0406 09/16/17 0455 09/17/17 0509  AST 127* 40 23 20 17   ALT 95* 57* 41 28 26  ALKPHOS 215* 150* 136* 129* 125  BILITOT 4.0* 1.1 0.8 0.6 0.5  PROT 7.3  6.3* 6.6 6.1* 6.2*  ALBUMIN 2.4* 2.0* 1.9* 1.9* 2.0*   Recent Labs  Lab 09/12/17 1510  LIPASE 34   No results for input(s): AMMONIA in the last 168 hours. Coagulation Profile: No results for input(s): INR, PROTIME in the last 168 hours. Cardiac Enzymes: No results for input(s): CKTOTAL, CKMB, CKMBINDEX, TROPONINI in the last 168 hours. BNP (last 3 results) Recent Labs    11/11/16 1051  PROBNP 194.0*   HbA1C: No results for input(s): HGBA1C in the last 72 hours. CBG: Recent Labs  Lab 09/16/17 0728 09/16/17 1149 09/16/17 1735 09/16/17 2112 09/17/17 0719  GLUCAP 106* 124* 100* 97 81  Lipid Profile: No results for input(s): CHOL, HDL, LDLCALC, TRIG, CHOLHDL, LDLDIRECT in the last 72 hours. Thyroid Function Tests: No results for input(s): TSH, T4TOTAL, FREET4, T3FREE, THYROIDAB in the last 72 hours. Anemia Panel: No results for input(s): VITAMINB12, FOLATE, FERRITIN, TIBC, IRON, RETICCTPCT in the last 72 hours. Urine analysis:    Component Value Date/Time   COLORURINE AMBER (A) 09/12/2017 1807   APPEARANCEUR CLEAR 09/12/2017 1807   LABSPEC 1.044 (H) 09/12/2017 1807   PHURINE 6.0 09/12/2017 1807   GLUCOSEU NEGATIVE 09/12/2017 1807   GLUCOSEU NEGATIVE 04/27/2013 1218   HGBUR MODERATE (A) 09/12/2017 1807   BILIRUBINUR SMALL (A) 09/12/2017 1807   KETONESUR NEGATIVE 09/12/2017 1807   PROTEINUR NEGATIVE 09/12/2017 1807   UROBILINOGEN 1.0 11/08/2014 1050   NITRITE NEGATIVE 09/12/2017 1807   LEUKOCYTESUR NEGATIVE 09/12/2017 1807   Sepsis Labs: @LABRCNTIP (procalcitonin:4,lacticidven:4) ) Recent Results (from the past 240 hour(s))  Blood Culture (routine x 2)     Status: None   Collection Time: 09/12/17  6:07 PM  Result Value Ref Range Status   Specimen Description   Final    BLOOD RIGHT ARM Performed at [facility], [place] [address] [place], [place] [address]    Special Requests   Final    BOTTLES DRAWN AEROBIC AND ANAEROBIC Blood Culture  adequate volume Performed at [place] [address], [place], [place] [address]    Culture   Final    NO GROWTH 5 DAYS Performed at [facility], [place] [address], [place], [place] [address]    Report Status 09/17/2017 FINAL  Final  Blood Culture (routine x 2)     Status: None   Collection Time: 09/12/17  6:07 PM  Result Value Ref Range Status   Specimen Description   Final    BLOOD RIGHT ANTECUBITAL Performed at [place] [address], [place], [place] [address]    Special Requests   Final    BOTTLES DRAWN AEROBIC AND ANAEROBIC Blood Culture adequate volume Performed at [place] [address] [place], [place] [address]    Culture   Final    NO GROWTH 5 DAYS Performed at [facility], [place] [address] [place], [place] [address]    Report Status 09/17/2017 FINAL  Final  Surgical PCR screen     Status: Abnormal   Collection Time: 09/13/17  1:47 AM  Result Value Ref Range Status   MRSA, PCR NEGATIVE NEGATIVE Final   Staphylococcus aureus POSITIVE (A) NEGATIVE Final    Comment: (NOTE) The Xpert SA Assay (FDA approved for NASAL specimens in patients 71 years of age and older), is one component of a comprehensive surveillance program. It is not intended to diagnose infection nor to guide or monitor treatment. Performed at [facility], [place] [address] [place], [place] [address]   Aerobic/Anaerobic Culture (surgical/deep wound)     Status: None (Preliminary result)   Collection Time: 09/13/17  2:11 PM  Result Value Ref Range Status   Specimen Description   Final    ABSCESS GALL BLADDER Performed at [place] [address] [place], [place] [address]    Special Requests   Final    Normal Performed at [facility], [place] [address] [place], [place] [address]    Gram Stain   Final    FEW WBC PRESENT, PREDOMINANTLY PMN MODERATE GRAM  POSITIVE COCCI IN CLUSTERS Performed at [facility],  [address] [place], [place] [address]    Culture   Final    FEW STAPHYLOCOCCUS AUREUS REPEATING SUSCEPTIBILITIES NO ANAEROBES ISOLATED; CULTURE IN PROGRESS FOR 5 DAYS    Report Status PENDING  Incomplete         Radiology Studies: Dg Abd Portable 2v  Result Date: 09/15/2017 CLINICAL DATA:  57 year old female with increasing generalized abdominal pain EXAM: PORTABLE ABDOMEN - 2 VIEW COMPARISON:  CT abdomen/pelvis 09/12/2017 FINDINGS: No evidence of free air on the lateral decubitus radiographs. The previously placed percutaneous cholecystostomy tube remains in unchanged position overlying the expected location of the gallbladder. The bowel gas pattern is not obstructed. No abnormal calcifications. No acute osseous abnormality. IMPRESSION: 1. Well-positioned percutaneous cholecystostomy tube. 2. No evidence of free air or bowel obstruction. Electronically Signed   By: Jacqulynn Cadet M.D.   On: 09/15/2017 09:25      Scheduled Meds: . feeding supplement  1 Container Oral TID BM  . furosemide  20 mg Oral Daily  . insulin aspart  0-15 Units Subcutaneous TID WC  . insulin aspart  0-5 Units Subcutaneous QHS  . mometasone-formoterol  2 puff Inhalation BID  . ondansetron (ZOFRAN) IV  4 mg Intravenous Q6H  . pantoprazole  40 mg Oral Daily  . rivaroxaban  20 mg Oral Q supper  . sodium chloride flush  5 mL Intracatheter Q8H  . topiramate  50 mg Oral BID  . venlafaxine XR  75 mg Oral Q breakfast   Continuous Infusions: . sodium chloride 10 mL/hr at 09/15/17 0920  . piperacillin-tazobactam (ZOSYN)  IV 3.375 g (09/17/17 0740)  . vancomycin Stopped (09/16/17 2219)     LOS: 5 days    Time spent in minutes: 35    Debbe Odea, MD [facility] Pager: [email or web address] Password [email or web address] 09/17/2017, 8:34 AM

## 2017-09-17 NOTE — Progress Notes (Signed)
Physical Therapy Treatment Patient Details Name: [name] MRN: [id number] DOB: [birth year] Today's Date: 09/17/2017    History of Present Illness 57 year old female with history of CAD, hypertension, diabetes, depression, DVT/PE, OSA, COPD on 2 L O2 and sarcoidosis Pt  presented with abdominal pain, nausea, vomiting and diarrhea.   CT abdomen showed acute cholecystitis with markedly distended gallbladder, adjacent intrahepatic and pericholecystic fluid concerning for abscess.  She was started on intravenous antibiotics and general surgery and  IR consulted and patient underwent percutaneous cholecystostomy tube placement by IR on 09/13/2017.    PT Comments    Pt assisted with ambulating in hallway however distance limited by dizziness.  Pt able to ambulate back to room after short seated rest break.  Vitals obtained as below once pt sitting in recliner for seated rest break. Pt encouraged to sit down promptly if feeling dizziness once home for safety.   09/17/17 0928  Vital Signs  Pulse Rate 66  Pulse Rate Source Monitor  BP 140/75  BP Method Automatic  Patient Position (if appropriate) Sitting  Oxygen Therapy  SpO2 98 %  O2 Device Nasal Cannula  O2 Flow Rate (L/min) 2 L/min     Follow Up Recommendations  Home health PT;Supervision - Intermittent     Equipment Recommendations  None recommended by PT    Recommendations for Other Services       Precautions / Restrictions Precautions Precautions: Fall Precaution Comments: O2 dependent Restrictions Weight Bearing Restrictions: No    Mobility  Bed Mobility Overal bed mobility: Modified Independent             General bed mobility comments: incr time, no assist  Transfers Overall transfer level: Needs assistance Equipment used: Rolling walker (2 wheeled) Transfers: Sit to/from Stand Sit to Stand: Min guard         General transfer comment: min/guard for safety, cues for hand placement    Ambulation/Gait Ambulation/Gait assistance: Min guard Gait Distance (Feet): 30 Feet(x2) Assistive device: Rolling walker (2 wheeled) Gait Pattern/deviations: Step-through pattern;Decreased stride length;Trunk flexed     General Gait Details: cues for posture and RW position; pt with dizziness (believes from smell (reports smelling garlic, also doesn't care to look at her drain which RN emptied prior to mobilizing)) so recliner provided and vitals obtained, pt able to ambulate back to room after short rest break   Stairs             Wheelchair Mobility    Modified Rankin (Stroke Patients Only)       Balance                                            Cognition Arousal/Alertness: Awake/alert Behavior During Therapy: WFL for tasks assessed/performed Overall Cognitive Status: Within Functional Limits for tasks assessed                                        Exercises      General Comments        Pertinent Vitals/Pain Pain Assessment: 0-10 Pain Score: 2  Pain Location: drain site Pain Descriptors / Indicators: Grimacing;Sore;Tender Pain Intervention(s): Limited activity within patient's tolerance;Repositioned;Monitored during session    Home Living  Prior Function            PT Goals (current goals can now be found in the care plan section) Progress towards PT goals: Progressing toward goals    Frequency    Min 3X/week      PT Plan Current plan remains appropriate    Co-evaluation              AM-PAC PT "6 Clicks" Daily Activity  Outcome Measure  Difficulty turning over in bed (including adjusting bedclothes, sheets and blankets)?: A Lot Difficulty moving from lying on back to sitting on the side of the bed? : A Little Difficulty sitting down on and standing up from a chair with arms (e.g., wheelchair, bedside commode, etc,.)?: A Little Help needed moving to and from a bed  to chair (including a wheelchair)?: A Little Help needed walking in hospital room?: A Little Help needed climbing 3-5 steps with a railing? : A Little 6 Click Score: 17    End of Session Equipment Utilized During Treatment: Gait belt;Oxygen Activity Tolerance: Other (comment)(limited by dizziness) Patient left: in chair;with call bell/phone within reach;with family/visitor present Nurse Communication: Mobility status PT Visit Diagnosis: Difficulty in walking, not elsewhere classified (R26.2);Muscle weakness (generalized) (M62.81)     Time: 8372-9021 PT Time Calculation (min) (ACUTE ONLY): 20 min  Charges:  $Gait Training: 8-22 mins                  [name], PT, DPT 09/17/2017 Pager: [phone]  [place] Ram E 09/17/2017, 10:07 AM

## 2017-09-17 NOTE — Care Management Important Message (Signed)
Important Message  Patient Details  Name: SHALAH ESTELLE MRN: [id number] Date of Birth: [birth year]   Medicare Important Message Given:  Yes    Kerin Salen 09/17/2017, 1:01 PMImportant Message  Patient Details  Name: BELLATRIX DEVONSHIRE MRN: [id number] Date of Birth: Jul 10, 1960   Medicare Important Message Given:  Yes    Kerin Salen 09/17/2017, 1:01 PM

## 2017-09-17 NOTE — Progress Notes (Signed)
Referring Physician(s): [place]  Supervising Physician: Arne Cleveland  Patient Status:  [name] - In-pt  Chief Complaint:  cholecystitis  Subjective: Pt doing ok today; tol diet ok, did have some nausea earlier but resolved; no vomiting; minimal RUQ tenderness   Allergies: Belsomra [suvorexant]; Enalapril maleate; Latex; Nickel; Other; Iodine; Doxycycline; Hydrochlorothiazide; Hydroxychloroquine sulfate; Lyrica [pregabalin]; and Tape  Medications: Prior to Admission medications   Medication Sig Start Date End Date Taking? Authorizing Provider  Adalimumab (HUMIRA) 40 MG/0.8ML PSKT Inject 40 mg into the skin every 7 (seven) days.    Yes [provider]  budesonide-formoterol (SYMBICORT) 160-4.5 MCG/ACT inhaler Inhale 2 puffs into the lungs TWICE DAILY 120/4=30 06/10/17  Yes Rigoberto Noel, MD  carvedilol (COREG) 12.5 MG tablet Take 1 tablet (12.5 mg total) by mouth 2 (two) times daily with a meal. 06/12/17  Yes Plotnikov, Evie Lacks, MD  gabapentin (NEURONTIN) 300 MG capsule Take 1 capsule (300 mg total) by mouth 2 (two) times daily as needed (for pain). 06/12/17  Yes Plotnikov, Evie Lacks, MD  potassium chloride (K-DUR,KLOR-CON) 10 MEQ tablet Take 10 mEq by mouth daily.  09/08/17  Yes [provider]  XARELTO 20 MG TABS tablet Take 1 tablet (20 mg total) by mouth at bedtime. 06/12/17  Yes Plotnikov, Evie Lacks, MD  clonazePAM (KLONOPIN) 0.5 MG tablet Take 1-2 tablets (0.5-1 mg total) by mouth at bedtime. 08/04/17   Plotnikov, Evie Lacks, MD  esomeprazole (NEXIUM) 40 MG capsule Take 1 capsule (40 mg total) by mouth daily before breakfast. 08/07/17   Plotnikov, Evie Lacks, MD  furosemide (LASIX) 40 MG tablet TAKE 1 TABLET BY MOUTH EVERY MORNING FOR EDEMA Patient taking differently: Take 20 mg by mouth daily as needed for fluid or edema.  01/10/17   Plotnikov, Evie Lacks, MD  glucose blood (EZ SMART PLUS GLUCOSE TEST) test strip Use as instructed to test sugars 4 times daily DX:  E11.8 09/11/17   Philemon Kingdom, MD  JANUVIA 50 MG tablet Take 1 tablet (50 mg total) by mouth daily. Patient taking differently: Take 50 mg by mouth daily.  09/10/17   Philemon Kingdom, MD  Lancets (ACCU-CHEK MULTICLIX) lancets Use as instructed to check sugars 4 times daily DX: E11.8 06/30/17   Philemon Kingdom, MD  metFORMIN (GLUCOPHAGE-XR) 500 MG 24 hr tablet TAKE 2 TABLETS(1000 MG) BY MOUTH DAILY WITH BREAKFAST 06/12/17   Plotnikov, Evie Lacks, MD  promethazine (PHENERGAN) 25 MG tablet Take 1 tablet (25 mg total) by mouth every 8 (eight) hours as needed for nausea or vomiting. 09/03/17   Plotnikov, Evie Lacks, MD  topiramate (TOPAMAX) 50 MG tablet Take 1 tablet (50 mg total) by mouth 2 (two) times daily. 06/12/17   Plotnikov, Evie Lacks, MD  venlafaxine XR (EFFEXOR-XR) 75 MG 24 hr capsule Take 1 capsule (75 mg total) by mouth daily with breakfast. 03/04/17   Plotnikov, Evie Lacks, MD  VENTOLIN HFA 108 (90 Base) MCG/ACT inhaler Inhale 2 puffs into the lungs every 6 (six) hours as needed for wheezing or shortness of breath. 200/8=25 Patient taking differently: Inhale 2 puffs into the lungs every 6 (six) hours as needed for wheezing or shortness of breath.  12/11/16   Plotnikov, Evie Lacks, MD  VOLTAREN 1 % GEL Apply 2 g topically 2 (two) times daily as needed (for pain). 100/4=25 Patient taking differently: Apply 4 g topically 4 (four) times daily as needed (pain).  04/29/17   Plotnikov, Evie Lacks, MD     Vital Signs: BP [name])  146/65 (BP Location: Left Arm)   Pulse 70   Temp 98.5 F (36.9 C) (Oral)   Resp 14   Ht 5\' 7"  (1.702 m)   Wt 272 lb 11.3 oz (123.7 kg)   SpO2 95%   BMI 42.71 kg/m   Physical Exam GB drain intact, output 175 cc green bile; insertion site ok, mildly tender  Imaging: Ir Perc Cholecystostomy  Result Date: 09/13/2017 INDICATION: 57 year old female with acute cholecystitis. She is a poor operative candidate and presents for percutaneous cholecystostomy tube placement. EXAM:  CHOLECYSTOSTOMY MEDICATIONS: Patient is on intravenous antibiotics as an inpatient. No additional prophylaxis administered. ANESTHESIA/SEDATION: Moderate (conscious) sedation was employed during this procedure. A total of Versed 2 mg and Fentanyl 50 mcg was administered intravenously. Moderate Sedation Time: 12 minutes. The patient's level of consciousness and vital signs were monitored continuously by radiology nursing throughout the procedure under my direct supervision. FLUOROSCOPY TIME:  Fluoroscopy Time: 0 minutes 53 seconds (54 mGy). COMPLICATIONS: None immediate. PROCEDURE: Informed written consent was obtained from the patient after a thorough discussion of the procedural risks, benefits and alternatives. All questions were addressed. Maximal Sterile Barrier Technique was utilized including caps, mask, sterile gowns, sterile gloves, sterile drape, hand hygiene and skin antiseptic. A timeout was performed prior to the initiation of the procedure. Ultrasound was used to interrogate the right upper quadrant. The grossly inflamed and irregular gallbladder is successfully identified. A suitable skin entry site was selected and local anesthesia attained by infiltration with 1% lidocaine. A small dermatotomy was made. Under real-time sonographic guidance, a 21 gauge Accustick needle was advanced through the skin, through a short transhepatic course and into the lumen of the gallbladder. The 0.018 wire was then coiled in the distended gallbladder lumen. The Accustick needle was exchanged for the Accustick sheath. A gentle hand injection of contrast material was performed opacifying the gallbladder lumen. A 75 cm superstiff Amplatz wire was then advanced and coiled in the gallbladder lumen. The Accustick sheath was removed. The soft tissue tract was dilated to 10 [place] and a Cook 10.2 [place] all-purpose drainage catheter was advanced over the wire and formed in the gallbladder. There was return of foul-smelling  milky bile. A sample was collected and sent for culture. A final gentle hand injection of contrast material was performed and a fluoroscopic spot image was saved to document the position of the drainage catheter. The tube was connected to gravity bag drainage and secured to the skin with 0 Prolene suture. The patient tolerated the procedure well. IMPRESSION: Successful placement of a 10 French transhepatic percutaneous cholecystostomy tube for acute cholecystitis. Electronically Signed   By: Jacqulynn Cadet M.D.   On: 09/13/2017 14:22   Dg Abd Portable 2v  Result Date: 09/15/2017 CLINICAL DATA:  57 year old female with increasing generalized abdominal pain EXAM: PORTABLE ABDOMEN - 2 VIEW COMPARISON:  CT abdomen/pelvis 09/12/2017 FINDINGS: No evidence of free air on the lateral decubitus radiographs. The previously placed percutaneous cholecystostomy tube remains in unchanged position overlying the expected location of the gallbladder. The bowel gas pattern is not obstructed. No abnormal calcifications. No acute osseous abnormality. IMPRESSION: 1. Well-positioned percutaneous cholecystostomy tube. 2. No evidence of free air or bowel obstruction. Electronically Signed   By: Jacqulynn Cadet M.D.   On: 09/15/2017 09:25    Labs:  CBC: Recent Labs    09/14/17 0436 09/15/17 0406 09/16/17 0455 09/17/17 0509  WBC 10.5 7.7 5.8 6.4  HGB 8.1* 9.1* 8.2* 8.7*  HCT 27.4* 30.7* 27.5* 29.3*  PLT 407* 493* 494* 549*    COAGS: Recent Labs    06/26/17 0751 09/13/17 0413 09/13/17 1211 09/13/17 2150 09/14/17 0841  INR 0.93  --   --   --   --   APTT  --  76* 75* 67* 96*    BMP: Recent Labs    09/14/17 0436 09/15/17 0406 09/16/17 0455 09/17/17 0509  NA 142 144 143 146*  K 3.5 3.6 3.3* 3.8  CL 109 110 109 108  CO2 24 27 28 30   GLUCOSE 93 117* 109* 92  BUN 11 7 5* <5*  CALCIUM 8.2* 8.2* 8.2* 8.4*  CREATININE 1.11* 1.04* 0.96 1.17*  GFRNONAA 54* 58* >60 51*  GFRAA >60 >60 >60 59*     LIVER FUNCTION TESTS: Recent Labs    09/14/17 0436 09/15/17 0406 09/16/17 0455 09/17/17 0509  BILITOT 1.1 0.8 0.6 0.5  AST 40 23 20 17   ALT 57* 41 28 26  ALKPHOS 150* 136* 129* 125  PROT 6.3* 6.6 6.1* 6.2*  ALBUMIN 2.0* 1.9* 1.9* 2.0*    Assessment and Plan: Pt with hx acute cholecystitis, poor surgical candidate; s/p perc GB drain 8/31; afebrile; WBC nl; hgb stable; creat 1.17, t bili nl; bile cx- few staph; cont current tx; as OP rec once daily irrigation of drain with 5-10 cc sterile NS, recording of output and dressing changes every 1-2 days; can f/u in IR drain clinic in 2-4 weeks (call [phone] or [phone] with any drain questions)   Electronically Signed: D. Rowe Robert, PA-C 09/17/2017, 2:00 PM   I spent a total of 15 minutes at the the patient's bedside AND on the patient's hospital floor or unit, greater than 50% of which was counseling/coordinating care for gallbladder drain    Patient ID: [name], female   DOB: Nov 30, 1960, 57 y.o.   MRN: [id number]

## 2017-09-18 DIAGNOSIS — Z841 Family history of disorders of kidney and ureter: Secondary | ICD-10-CM

## 2017-09-18 DIAGNOSIS — Z934 Other artificial openings of gastrointestinal tract status: Secondary | ICD-10-CM

## 2017-09-18 DIAGNOSIS — G4733 Obstructive sleep apnea (adult) (pediatric): Secondary | ICD-10-CM

## 2017-09-18 DIAGNOSIS — R51 Headache: Secondary | ICD-10-CM

## 2017-09-18 DIAGNOSIS — Z888 Allergy status to other drugs, medicaments and biological substances status: Secondary | ICD-10-CM

## 2017-09-18 DIAGNOSIS — Z881 Allergy status to other antibiotic agents status: Secondary | ICD-10-CM

## 2017-09-18 DIAGNOSIS — N189 Chronic kidney disease, unspecified: Secondary | ICD-10-CM

## 2017-09-18 DIAGNOSIS — Z8249 Family history of ischemic heart disease and other diseases of the circulatory system: Secondary | ICD-10-CM

## 2017-09-18 DIAGNOSIS — I2510 Atherosclerotic heart disease of native coronary artery without angina pectoris: Secondary | ICD-10-CM

## 2017-09-18 DIAGNOSIS — K810 Acute cholecystitis: Secondary | ICD-10-CM

## 2017-09-18 DIAGNOSIS — Z91048 Other nonmedicinal substance allergy status: Secondary | ICD-10-CM

## 2017-09-18 DIAGNOSIS — E669 Obesity, unspecified: Secondary | ICD-10-CM

## 2017-09-18 DIAGNOSIS — Z79899 Other long term (current) drug therapy: Secondary | ICD-10-CM

## 2017-09-18 DIAGNOSIS — R110 Nausea: Secondary | ICD-10-CM

## 2017-09-18 DIAGNOSIS — D8689 Sarcoidosis of other sites: Secondary | ICD-10-CM

## 2017-09-18 DIAGNOSIS — Z87891 Personal history of nicotine dependence: Secondary | ICD-10-CM

## 2017-09-18 DIAGNOSIS — J449 Chronic obstructive pulmonary disease, unspecified: Secondary | ICD-10-CM

## 2017-09-18 DIAGNOSIS — E1122 Type 2 diabetes mellitus with diabetic chronic kidney disease: Secondary | ICD-10-CM

## 2017-09-18 DIAGNOSIS — I5189 Other ill-defined heart diseases: Secondary | ICD-10-CM

## 2017-09-18 DIAGNOSIS — Z91041 Radiographic dye allergy status: Secondary | ICD-10-CM

## 2017-09-18 DIAGNOSIS — Z91040 Latex allergy status: Secondary | ICD-10-CM

## 2017-09-18 LAB — BASIC METABOLIC PANEL
ANION GAP: 9 (ref 5–15)
BUN: 5 mg/dL — AB (ref 6–20)
CHLORIDE: 109 mmol/L (ref 98–111)
CO2: 28 mmol/L (ref 22–32)
Calcium: 8.2 mg/dL — ABNORMAL LOW (ref 8.9–10.3)
Creatinine, Ser: 1.15 mg/dL — ABNORMAL HIGH (ref 0.44–1.00)
GFR calc Af Amer: >60 mL/min (ref >60)
GFR calc non Af Amer: 52 mL/min — ABNORMAL LOW (ref >60)
GLUCOSE: 86 mg/dL (ref 70–99)
POTASSIUM: 3.2 mmol/L — AB (ref 3.5–5.1)
Sodium: 146 mmol/L — ABNORMAL HIGH (ref 135–145)

## 2017-09-18 LAB — GLUCOSE, CAPILLARY
GLUCOSE-CAPILLARY: 97 mg/dL (ref 70–99)
GLUCOSE-CAPILLARY: 91 mg/dL (ref 70–99)
GLUCOSE-CAPILLARY: 90 mg/dL (ref 70–99)
Glucose-Capillary: 85 mg/dL (ref 70–99)

## 2017-09-18 LAB — MAGNESIUM: Magnesium: 2 mg/dL (ref 1.7–2.4)

## 2017-09-18 LAB — CBC
HEMATOCRIT: 30.6 % — AB (ref 36.0–46.0)
Hemoglobin: 9.1 g/dL — ABNORMAL LOW (ref 12.0–15.0)
MCH: 28 pg (ref 26.0–34.0)
MCHC: 29.7 g/dL — AB (ref 30.0–36.0)
MCV: 94.2 fL (ref 78.0–100.0)
Platelets: 556 10*3/uL — ABNORMAL HIGH (ref 150–400)
RBC: 3.25 MIL/uL — ABNORMAL LOW (ref 3.87–5.11)
RDW: 16.3 % — ABNORMAL HIGH (ref 11.5–15.5)
WBC: 6.5 10*3/uL (ref 4.0–10.5)

## 2017-09-18 MED ORDER — POTASSIUM CHLORIDE CRYS ER 20 MEQ PO TBCR
40.0000 meq | EXTENDED_RELEASE_TABLET | ORAL | Status: AC
  Administered 2017-09-18 (×2): 40 meq via ORAL
  Filled 2017-09-18 (×2): qty 2

## 2017-09-18 NOTE — Care Management (Signed)
Per previous CM note pt chose [facility] for home health services. Home health orders received from MD and referral called to [facility] rep. Marney Doctor RN,BSN [phone]

## 2017-09-18 NOTE — Progress Notes (Signed)
Referring Physician(s): Leighton Ruff  Supervising Physician: Daryll Brod  Patient Status:  [name] - In-pt  Chief Complaint: RUQ tenderness  Subjective:  Acute cholecystitis s/p drain placement 09/13/2017 with Dr. Laurence Ferrari. Patient awake and alert laying in bed. Complains of mild RUQ tenderness. RUQ drain site c/d/i. Minimal output of yellow/green bile in gravity bag. Drain flushes/aspirates without resistance.   Allergies: Belsomra [suvorexant]; Enalapril maleate; Latex; Nickel; Other; Iodine; Doxycycline; Hydrochlorothiazide; Hydroxychloroquine sulfate; Lyrica [pregabalin]; and Tape  Medications: Prior to Admission medications   Medication Sig Start Date End Date Taking? Authorizing Provider  Adalimumab (HUMIRA) 40 MG/0.8ML PSKT Inject 40 mg into the skin every 7 (seven) days.    Yes [provider]  budesonide-formoterol (SYMBICORT) 160-4.5 MCG/ACT inhaler Inhale 2 puffs into the lungs TWICE DAILY 120/4=30 06/10/17  Yes Rigoberto Noel, MD  carvedilol (COREG) 12.5 MG tablet Take 1 tablet (12.5 mg total) by mouth 2 (two) times daily with a meal. 06/12/17  Yes Plotnikov, Evie Lacks, MD  gabapentin (NEURONTIN) 300 MG capsule Take 1 capsule (300 mg total) by mouth 2 (two) times daily as needed (for pain). 06/12/17  Yes Plotnikov, Evie Lacks, MD  potassium chloride (K-DUR,KLOR-CON) 10 MEQ tablet Take 10 mEq by mouth daily.  09/08/17  Yes [provider]  XARELTO 20 MG TABS tablet Take 1 tablet (20 mg total) by mouth at bedtime. 06/12/17  Yes Plotnikov, Evie Lacks, MD  clonazePAM (KLONOPIN) 0.5 MG tablet Take 1-2 tablets (0.5-1 mg total) by mouth at bedtime. 08/04/17   Plotnikov, Evie Lacks, MD  esomeprazole (NEXIUM) 40 MG capsule Take 1 capsule (40 mg total) by mouth daily before breakfast. 08/07/17   Plotnikov, Evie Lacks, MD  furosemide (LASIX) 40 MG tablet TAKE 1 TABLET BY MOUTH EVERY MORNING FOR EDEMA Patient taking differently: Take 20 mg by mouth daily as needed for  fluid or edema.  01/10/17   Plotnikov, Evie Lacks, MD  glucose blood (EZ SMART PLUS GLUCOSE TEST) test strip Use as instructed to test sugars 4 times daily DX: E11.8 09/11/17   Philemon Kingdom, MD  JANUVIA 50 MG tablet Take 1 tablet (50 mg total) by mouth daily. Patient taking differently: Take 50 mg by mouth daily.  09/10/17   Philemon Kingdom, MD  Lancets (ACCU-CHEK MULTICLIX) lancets Use as instructed to check sugars 4 times daily DX: E11.8 06/30/17   Philemon Kingdom, MD  metFORMIN (GLUCOPHAGE-XR) 500 MG 24 hr tablet TAKE 2 TABLETS(1000 MG) BY MOUTH DAILY WITH BREAKFAST 06/12/17   Plotnikov, Evie Lacks, MD  promethazine (PHENERGAN) 25 MG tablet Take 1 tablet (25 mg total) by mouth every 8 (eight) hours as needed for nausea or vomiting. 09/03/17   Plotnikov, Evie Lacks, MD  topiramate (TOPAMAX) 50 MG tablet Take 1 tablet (50 mg total) by mouth 2 (two) times daily. 06/12/17   Plotnikov, Evie Lacks, MD  venlafaxine XR (EFFEXOR-XR) 75 MG 24 hr capsule Take 1 capsule (75 mg total) by mouth daily with breakfast. 03/04/17   Plotnikov, Evie Lacks, MD  VENTOLIN HFA 108 (90 Base) MCG/ACT inhaler Inhale 2 puffs into the lungs every 6 (six) hours as needed for wheezing or shortness of breath. 200/8=25 Patient taking differently: Inhale 2 puffs into the lungs every 6 (six) hours as needed for wheezing or shortness of breath.  12/11/16   Plotnikov, Evie Lacks, MD  VOLTAREN 1 % GEL Apply 2 g topically 2 (two) times daily as needed (for pain). 100/4=25 Patient taking differently: Apply 4 g topically 4 (four) times  daily as needed (pain).  04/29/17   Plotnikov, Evie Lacks, MD     Vital Signs: BP (!) 148/74   Pulse 65   Temp 98.3 F (36.8 C) (Oral)   Resp 16   Ht 5\' 7"  (1.702 m)   Wt 271 lb 2.7 oz (123 kg)   SpO2 94%   BMI 42.47 kg/m   Physical Exam  Constitutional: She is oriented to person, place, and time. She appears well-developed and well-nourished. No distress.  Pulmonary/Chest: Effort normal. No  respiratory distress.  Abdominal: Soft. There is tenderness.  RUQ drain site without erythema, edema, or drainage; minimal output of yellow/green bile in collection bag; drain flushes/aspirates without resistance.  Neurological: She is alert and oriented to person, place, and time.  Skin: Skin is warm and dry.  Psychiatric: She has a normal mood and affect. Her behavior is normal. Judgment and thought content normal.  Nursing note and vitals reviewed.   Imaging: Dg Abd Portable 2v  Result Date: 09/15/2017 CLINICAL DATA:  57 year old female with increasing generalized abdominal pain EXAM: PORTABLE ABDOMEN - 2 VIEW COMPARISON:  CT abdomen/pelvis 09/12/2017 FINDINGS: No evidence of free air on the lateral decubitus radiographs. The previously placed percutaneous cholecystostomy tube remains in unchanged position overlying the expected location of the gallbladder. The bowel gas pattern is not obstructed. No abnormal calcifications. No acute osseous abnormality. IMPRESSION: 1. Well-positioned percutaneous cholecystostomy tube. 2. No evidence of free air or bowel obstruction. Electronically Signed   By: Jacqulynn Cadet M.D.   On: 09/15/2017 09:25    Labs:  CBC: Recent Labs    09/15/17 0406 09/16/17 0455 09/17/17 0509 09/18/17 0517  WBC 7.7 5.8 6.4 6.5  HGB 9.1* 8.2* 8.7* 9.1*  HCT 30.7* 27.5* 29.3* 30.6*  PLT 493* 494* 549* 556*    COAGS: Recent Labs    06/26/17 0751 09/13/17 0413 09/13/17 1211 09/13/17 2150 09/14/17 0841  INR 0.93  --   --   --   --   APTT  --  76* 75* 67* 96*    BMP: Recent Labs    09/15/17 0406 09/16/17 0455 09/17/17 0509 09/18/17 0517  NA 144 143 146* 146*  K 3.6 3.3* 3.8 3.2*  CL 110 109 108 109  CO2 27 28 30 28   GLUCOSE 117* 109* 92 86  BUN 7 5* <5* 5*  CALCIUM 8.2* 8.2* 8.4* 8.2*  CREATININE 1.04* 0.96 1.17* 1.15*  GFRNONAA 58* >60 51* 52*  GFRAA >60 >60 59* >60    LIVER FUNCTION TESTS: Recent Labs    09/14/17 0436 09/15/17 0406  09/16/17 0455 09/17/17 0509  BILITOT 1.1 0.8 0.6 0.5  AST 40 23 20 17   ALT 57* 41 28 26  ALKPHOS 150* 136* 129* 125  PROT 6.3* 6.6 6.1* 6.2*  ALBUMIN 2.0* 1.9* 1.9* 2.0*    Assessment and Plan:  Acute cholecystitis s/p drain placement 09/13/2017 with Dr. Laurence Ferrari. RUQ drain site stable with minimal output of bile; drain flushes/aspirates without resistance. Continue with Qshift flushes and monitoring of output. Outpatient follow-up order in place.  IR to follow.   Electronically Signed: Earley Abide, PA-C 09/18/2017, 2:15 PM   I spent a total of 15 Minutes at the the patient's bedside AND on the patient's hospital floor or unit, greater than 50% of which was counseling/coordinating care for acute cholecystitis s/p drain placement.

## 2017-09-18 NOTE — Progress Notes (Signed)
At 1700, I was notified by Debbe Odea, MD that the pt could be discharged today. I made the MD aware of the pt's lack of desire to be involved in the drain care. Per MD, she will be d/c home with the drain. I spoke with the pt this morning about the importance of learning how to care for her drain and she declined to even look at the drain or insertion site during the dressing change, flushing the drain, and each time it has ben emptied during my shift. The pt also expressed a lack of family support d/t another family member having a chronic illness. She reported that her son has too much on his hands already.  At 28, Debbe Odea, MD called again and requested to speak to the pt regarding what I mentioned above. The pt told the MD that her neighbor is helpful and maybe she could talk to her neighbor and get them to come tomorrow (9/6) to learn how to help the pt care for the drain. Also, the pt agreed to participate in flushing the drain today and tomorrow for a total of 3 times by herself.   At 1725, the pt flushed her drain by herself, as I observed. She wore gloves as she said it made her feel more comfortable while handling the drain. After flushing the drain, the pt requested to practice emptying the drain and did so while I observed. The pt expressed being very proud of herself and stated that she feels better now about caring for her drain.

## 2017-09-18 NOTE — Consult Note (Signed)
[name]    Date of Admission:  09/12/2017           Day 7 vancomycin        Day 7 piperacillin tazobactam       Reason for Consult: Acute cholecystitis    Referring Provider: Dr. Lucilla Edin Primary Care Provider: Dr. Cristie Hem Plotnikov  Assessment: She is improving on therapy for acute cholecystitis following percutaneous drain placement.  Staph aureus it is an unusual biliary pathogen but it seems to be the only culprit here.  It is susceptible to trimethoprim sulfamethoxazole.  I recommend 2 more weeks of therapy.  This should be adequate for cholecystitis and what appears to be several very small surrounding abscesses.  Plan: 1. Discontinue vancomycin and piperacillin tazobactam 2. Start trimethoprim sulfamethoxazole 1 double strength tablet twice daily for 2 weeks 3. I will arrange follow-up in my clinic on 10/06/2017 4. Agree with follow-up in the IR drain clinic  Principal Problem:   Acute cholecystitis Active Problems:   Sepsis ([place])   Sarcoidosis   Major depressive disorder, recurrent episode ([place])   Essential hypertension   Obesity   CAD in native artery   Controlled type 2 diabetes mellitus with chronic kidney disease, without long-term current use of insulin (HCC)   Pulmonary embolism (HCC)   OSA (obstructive sleep apnea)   Diastolic dysfunction   COPD (chronic obstructive pulmonary disease) (HCC)   Migraines   Scheduled Meds: . feeding supplement  1 Container Oral TID BM  . insulin aspart  0-15 Units Subcutaneous TID WC  . insulin aspart  0-5 Units Subcutaneous QHS  . mometasone-formoterol  2 puff Inhalation BID  . ondansetron (ZOFRAN) IV  4 mg Intravenous Q6H  . pantoprazole  40 mg Oral Daily  . rivaroxaban  20 mg Oral Q supper  . sodium chloride flush  5 mL Intracatheter Q8H  . topiramate  50 mg Oral BID  . venlafaxine XR  75 mg Oral Q breakfast   Continuous Infusions: . sodium chloride 10 mL/hr at 09/17/17 1332    . piperacillin-tazobactam (ZOSYN)  IV 3.375 g (09/18/17 1534)  . vancomycin Stopped (09/17/17 2253)   PRN Meds:.sodium chloride, acetaminophen, ipratropium-albuterol, metoCLOPramide (REGLAN) injection, oxyCODONE  HPI: [name] is a 57 y.o. 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  She was admitted 1 week ago with abdominal pain, nausea, vomiting and diarrhea.  She had a low-grade temperature of 100.5 degrees and leukocytosis upon admission.  CT scan revealed a very distended and thickened gallbladder consistent with acute cholecystitis.  There were also several small areas of low-attenuation in the adjacent liver and gallbladder fossa with peripheral enhancement compatible with small multifocal abscesses.  She was felt to be too high risk for surgery and underwent percutaneous cholecystostomy on 09/13/2017.  "Foul-smelling, milky bile" was obtained.  Gram stain showed gram-positive cocci in clusters and cultures have grown staph aureus.  Oxacillin susceptibilities could not be obtained and her laboratory but we do know that the organism was susceptible to both tetracycline and trimethoprim sulfamethoxazole.  She has defervesced and her white count has returned to normal.  She is still having some nausea and headache but otherwise is feeling much better.  Her liver enzymes have returned to normal.  She is still having several 100 cc of bile out of the drain daily.  Review of Systems: Review of Systems  Constitutional: Positive for malaise/fatigue. Negative for chills, diaphoresis and fever.  Respiratory: Positive for shortness of breath. Negative for cough and sputum production.   Cardiovascular: Negative for chest pain.  Gastrointestinal: Positive for abdominal pain and nausea. Negative for diarrhea and vomiting.       She feels like most of her abdominal pain  now is due to the drain tube  Skin: Negative for rash.  Neurological: Positive for headaches.    Past Medical History:  Diagnosis Date  . Allergic rhinitis   . ALLERGIC RHINITIS 10/26/2009  . Anxiety   . ANXIETY 08/14/2006  . B12 DEFICIENCY 08/25/2007  . Complication of anesthesia    pt has had difficulty following anesthesia with her knee in 2016-unable to care for herself afterward  . Confusion   . Depression    takes Lexapro daily  . Diabetes mellitus without complication ([place])    was on insulin but has been off since Nov 2015 and now only takes Metformin daily  . DYSPNEA 04/28/2009   with exertion  . Esophageal reflux    takes Nexium daily  . Fibromyalgia   . Headache    last migraine 2-58yrs ago;takes Topamax daily  . History of shingles   . Hypertension    takes Coreg daily  . Insomnia    takes Nortriptyline nightly   . Joint pain   . Joint swelling   . Left knee pain   . Lichen planus   . Long-term memory loss   . Nocturia   . OSA (obstructive sleep apnea)    doesn't use CPAP;sleep study in epic from 2006  . Osteoarthritis   . Osteoarthritis   . Pneumonia    over 30 yrs ago  . Protein calorie malnutrition ([place])   . Rheumatoid arthritis ([place])   . Sarcoidosis    Dr. Lolita Patella  . Short-term memory loss   . Shortness of breath   . TIA (transient ischemic attack)   . Unsteady gait   . Urinary urgency   . Vitamin D deficiency    is supposed to take Vit D but can't afford it  . VITAMIN D DEFICIENCY 08/25/2007    Social History   Tobacco Use  . Smoking status: Former Smoker    Packs/day: 0.50    Years: 10.00    Pack years: 5.00    Types: Cigarettes  . Smokeless tobacco: Never Used  . Tobacco comment: quit smoking in 2004  Substance Use Topics  . Alcohol use: No  . Drug use: No    Family History  Problem Relation Age of Onset  . Heart disease Mother   . Kidney disease Mother   . Cancer Father        leukemia  . Hypertension Unknown   . Coronary  artery disease Unknown        female 1st degree relative <60  . Heart failure Unknown        congestive  . Coronary artery disease Unknown        Female 1st degree relative <50  . Breast cancer Unknown        1st degree relative <50 S  . Breast cancer Sister    Allergies  Allergen Reactions  . Belsomra [Suvorexant] Other (See Comments)    [place] out of bed while asleep: "I'm waking up as I'm falling on the floor;" "Night terrors"  . Enalapril Maleate Cough  . Latex Hives  . Nickel Other (See  Comments)    Blisters  Pt has a titanium right and left knee - nickel causes skin irritations that form into blisters and sores  . Other Other (See Comments)    Patient has Sarcoidosis and can't tolerate any metals  . Iodine Other (See Comments)    Patient received IV contrast without pre-meds without any problems 09/12/17 kwl Mother was intolerant--CODED on CT table---pt never tried  . Doxycycline Diarrhea and Nausea And Vomiting  . Hydrochlorothiazide Other (See Comments)    Low potassium levels   . Hydroxychloroquine Sulfate Other (See Comments)    Vision changes   . Lyrica [Pregabalin] Other (See Comments)    "Made me feel high"  . Tape Other (See Comments)    Medical tape causes bruising    OBJECTIVE: Blood pressure (!) 151/77, pulse 64, temperature 98.3 F (36.8 C), temperature source Oral, resp. rate 15, height 5\' 7"  (1.702 m), weight 123 kg, SpO2 96 %.  Physical Exam  Constitutional: She is oriented to person, place, and time.  She is resting comfortably in bed.  Cardiovascular: Normal rate, regular rhythm and normal heart sounds.  No murmur heard. Pulmonary/Chest: Effort normal and breath sounds normal.  Abdominal: Soft. She exhibits no distension.  She is obese.  Has mild discomfort in the right upper quadrant around the drain.  Small amount of green bile in the drain bag.  Neurological: She is alert and oriented to person, place, and time.  Skin: No rash noted.  Psychiatric:  She has a normal mood and affect.    Lab Results Lab Results  Component Value Date   WBC 6.5 09/18/2017   HGB 9.1 (L) 09/18/2017   HCT 30.6 (L) 09/18/2017   MCV 94.2 09/18/2017   PLT 556 (H) 09/18/2017    Lab Results  Component Value Date   CREATININE 1.15 (H) 09/18/2017   BUN 5 (L) 09/18/2017   NA 146 (H) 09/18/2017   K 3.2 (L) 09/18/2017   CL 109 09/18/2017   CO2 28 09/18/2017    Lab Results  Component Value Date   ALT 26 09/17/2017   AST 17 09/17/2017   ALKPHOS 125 09/17/2017   BILITOT 0.5 09/17/2017     Microbiology: Recent Results (from the past 240 hour(s))  Blood Culture (routine x 2)     Status: None   Collection Time: 09/12/17  6:07 PM  Result Value Ref Range Status   Specimen Description   Final    BLOOD RIGHT ARM Performed at [facility], [place] [address], [place], [place] [address]    Special Requests   Final    BOTTLES DRAWN AEROBIC AND ANAEROBIC Blood Culture adequate volume Performed at [facility] [address] [place], [place] [address]    Culture   Final    NO GROWTH 5 DAYS Performed at [facility], [place] [address], [place], [place] [address]    Report Status 09/17/2017 FINAL  Final  Blood Culture (routine x 2)     Status: None   Collection Time: 09/12/17  6:07 PM  Result Value Ref Range Status   Specimen Description   Final    BLOOD RIGHT ANTECUBITAL Performed at [facility] [address] [place], [place] [address]    Special Requests   Final    BOTTLES DRAWN AEROBIC AND ANAEROBIC Blood Culture adequate volume Performed at [facility] [address] [place], [place] [address]    Culture   Final  NO GROWTH 5 DAYS Performed at [facility], [place] [address], [place], [place] [address]    Report Status 09/17/2017 FINAL  Final  Surgical PCR screen     Status: Abnormal   Collection Time: 09/13/17  1:47 AM  Result Value Ref Range Status    MRSA, PCR NEGATIVE NEGATIVE Final   Staphylococcus aureus POSITIVE (A) NEGATIVE Final    Comment: (NOTE) The Xpert SA Assay (FDA approved for NASAL specimens in patients 51 years of age and older), is one component of a comprehensive surveillance program. It is not intended to diagnose infection nor to guide or monitor treatment. Performed at [facility], [place] [address] [place], [place] [address]   Aerobic/Anaerobic Culture (surgical/deep wound)     Status: None (Preliminary result)   Collection Time: 09/13/17  2:11 PM  Result Value Ref Range Status   Specimen Description   Final    ABSCESS GALL BLADDER Performed at [facility] [address], [place], [place] [address]    Special Requests   Final    Normal Performed at [facility], [place] [address], [place], [place] [address]    Gram Stain   Final    FEW WBC PRESENT, PREDOMINANTLY PMN MODERATE GRAM POSITIVE COCCI IN CLUSTERS    Culture   Final    FEW STAPHYLOCOCCUS AUREUS SUSCEPTIBILITIES TO FOLLOW [facility] NO ANAEROBES ISOLATED Performed at [facility], [place] [address], [place], [place] [address]    Report Status PENDING  Incomplete    Michel Bickers, MD [facility] for [facility] [phone] pager   [phone] cell 09/18/2017, 3:58 PM

## 2017-09-18 NOTE — Progress Notes (Addendum)
Pt reported frustration regarding a fluid restriction of 1500 ml. Pt reports that she doesn't understand why the fluid restriction was ordered and requested to speak to the MD. Debbe Odea, MD was paged regarding the pt's complaint. I provided the pt with education and emotion support.   Debbe Odea, MD responded to page and verbally requested that I discontinue the fluid restriction order.  The fluid restriction order of 1500 has been D/C at this time.

## 2017-09-19 DIAGNOSIS — D869 Sarcoidosis, unspecified: Secondary | ICD-10-CM

## 2017-09-19 LAB — GLUCOSE, CAPILLARY
GLUCOSE-CAPILLARY: 88 mg/dL (ref 70–99)
GLUCOSE-CAPILLARY: 88 mg/dL (ref 70–99)

## 2017-09-19 LAB — CREATININE, SERUM
Creatinine, Ser: 1.17 mg/dL — ABNORMAL HIGH (ref 0.44–1.00)
GFR calc Af Amer: 59 mL/min — ABNORMAL LOW (ref >60)
GFR, EST NON AFRICAN AMERICAN: 51 mL/min — AB (ref >60)

## 2017-09-19 MED ORDER — POLYETHYLENE GLYCOL 3350 17 G PO PACK: 17.0000 g | PACK | Freq: Every day | ORAL | 0 refills | Status: DC

## 2017-09-19 MED ORDER — ACETAMINOPHEN 325 MG PO TABS: 650.0000 mg | ORAL_TABLET | Freq: Four times a day (QID) | ORAL | Status: DC | PRN

## 2017-09-19 MED ORDER — SULFAMETHOXAZOLE-TRIMETHOPRIM 400-80 MG PO TABS: 1.0000 | ORAL_TABLET | Freq: Two times a day (BID) | ORAL | 0 refills | Status: DC

## 2017-09-19 MED ORDER — OXYCODONE HCL 5 MG PO TABS: 5.0000 mg | ORAL_TABLET | ORAL | 0 refills | Status: DC | PRN

## 2017-09-19 MED ORDER — POLYETHYLENE GLYCOL 3350 17 G PO PACK
17.0000 g | PACK | Freq: Every day | ORAL | Status: DC
  Administered 2017-09-19: 17 g via ORAL
  Filled 2017-09-19: qty 1

## 2017-09-19 MED ORDER — SULFAMETHOXAZOLE-TRIMETHOPRIM 400-80 MG PO TABS
1.0000 | ORAL_TABLET | Freq: Two times a day (BID) | ORAL | Status: DC
  Administered 2017-09-19: 1 via ORAL
  Filled 2017-09-19 (×2): qty 1

## 2017-09-19 NOTE — Plan of Care (Signed)
Patient discharged home. All instructions went over with her, she verbalized understanding. Was able to successfully drain and flush her perc drain. Instructed her how to change the dressing around the perc drain site. Ivs removed. Rx have been called in. Patient left the floor in stable condition

## 2017-09-19 NOTE — Discharge Instructions (Signed)
Please keep a record of of output from drain- change dressing every 1-2 days Flush catheter with 5 cc of saline every 8 hrs.  Do not take Humira until told to do so by your PCP.  See your PCP by this upcoming Wednesday. F/u with Drain clinic and Dr Megan Salon are recommended.  You were cared for by a hospitalist during your hospital stay. If you have any questions about your discharge medications or the care you received while you were in the hospital after you are discharged, you can call the unit and asked to speak with the hospitalist on call if the hospitalist that took care of you is not available. Once you are discharged, your primary care physician will handle any further medical issues.   Please note that NO REFILLS for any discharge medications will be authorized once you are discharged, as it is imperative that you return to your primary care physician (or establish a relationship with a primary care physician if you do not have one) for your aftercare needs so that they can reassess your need for medications and monitor your lab values.  Please take all your medications with you for your next visit with your Primary MD. Please ask your Primary MD to get all Hospital records sent to his/her office. Please request your Primary MD to go over all hospital test results at the follow up.   If you experience worsening of your admission symptoms, develop shortness of breath, chest pain, suicidal or homicidal thoughts or a life threatening emergency, you must seek medical attention immediately by calling 911 or calling your MD.   Dennis Bast must read the complete instructions/literature along with all the possible adverse reactions/side effects for all the medicines you take including new medications that have been prescribed to you. Take new medicines after you have completely understood and accpet all the possible adverse reactions/side effects.    Do not drive when taking pain medications or sedatives.       Do not take more than prescribed Pain, Sleep and Anxiety Medications   If you have smoked or chewed Tobacco in the last 2 yrs please stop. Stop any regular alcohol  and or recreational drug use.   Wear Seat belts while driving.

## 2017-09-19 NOTE — Discharge Summary (Signed)
Physician Discharge Summary  [name] SHF:[id number] DOB: 1960/03/24 DOA: 09/12/2017  PCP: Cassandria Anger, MD  Admit date: 09/12/2017 Discharge date: 09/19/2017  Admitted From: home  Disposition:  home   Recommendations for Outpatient Follow-up:  1. Please f//u Bmet and CBC in 4-5 days  Home Health:  ordered    Discharge Condition:  stable   CODE STATUS:  Full code    Consultants:   IR  Gen surgery   ID Procedures:   Percutaneous drain in RUQ/ Gallbladder    Discharge Diagnoses:  Principal Problem:   Acute cholecystitis Active Problems:   Sepsis ([place])   Sarcoidosis   Major depressive disorder, recurrent episode ([place])   Essential hypertension   Obesity   CAD in native artery   Controlled type 2 diabetes mellitus with chronic kidney disease, without long-term current use of insulin ([place])   Pulmonary embolism ([facility])   OSA (obstructive sleep apnea)   Diastolic dysfunction   Migraines   Brief Summary: Angela Sheriff [place] a 57 y.o.femalewith medical history significant ofCAD, hypertension, diabetes, depression, DVT/PE on Xarelto, OSA, dermal and pulmonary Sarcoidosis on 2 L by nasal cannula on Humira who presented with 2 weeks of nausea, vomiting, diarrhea and abdominal pain. ED> vital signs: fever to 100.5, BP 90s/50s  CT abdomen > acute cholecystitis and markedly distended gallbladder,adjacent intrahepatic and pericholecystic fluid concerning for abscess WBC 21.9  Hospital Course:  Principal Problem:   Cholecystitis with possible intrahepatic and pericholecystic abscess Sepsis  Elevated LFTs - immune compromised due to Humira use - gen surgery has decided that due to her pulmonary sarcoid, she is not a good candidate for surgery at this time- gen surgery signed off on 9/4 - perc drain placed on 8/31 by IR - few staph aureus growing in biliary fluid culture- MRSA PCR +  blood cultures NGTD - her symptoms of pain and nausea have  resolved and she has been eating and drinking well - IR recommends f/u in drain clinic in 2-4 wks, once daily irrigation of drain with 5-10 cc sterile NS, recording of output and dressing changes every 1-2 days; - 550 cc out yesterday from drain - LFTs and WBC normalized - I have asked ID for further assistance- she has been started on Bactrim DS BID for 2 wks per Dr Hale Bogus recommendations- she is recommended to f/u with him on 10/06/17 - she has learned drain care in the hospital and home health RNs will follow her as well  Active Problems: 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 Chronic hypoxic respiratory failure on 2 L O2 at baseline due to sarcoidosis - quite stable on 2 L during the hospital stay - recommend holding Humira until infection resolves (she has not taken it for the past 3 wks as she ran out of her prescription) - managed by Dr Elsworth Soho as outpt    Major depressive disorder, recurrent episode   - cont Effexor and Clonazepam    Essential hypertension/ mild LVH and EF 60-65% on ECHO - continue coreg - cont to hold Lasix (on it PRN) as she is not fluid overlaoded     Controlled type 2 diabetes mellitus  - Januvia and Metformin on hold- cont SSI- sugars stable  CKD 2-3   - stable     Pulmonary embolism / DVT history - cont Xarelto  Mild thrombocytosis - f/u as outpt     Discharge Exam: Vitals:   09/19/17 0536 09/19/17 1020  BP: (!) 150/75   Pulse: 67  Resp:    Temp: 98 F (36.7 C)   SpO2: 99% 93%   Vitals:   09/18/17 2158 09/18/17 2158 09/19/17 0536 09/19/17 1020  BP:  (!) 166/72 (!) 150/75   Pulse:  62 67   Resp:  15    Temp:  98 F (36.7 C) 98 F (36.7 C)   TempSrc:  Oral Oral   SpO2: 96% 97% 99% 93%  Weight:      Height:        General: Pt is alert, awake, not in acute distress Cardiovascular: RRR, S1/S2 +, no rubs, no gallops Respiratory: CTA bilaterally, no wheezing, no rhonchi Abdominal: Soft, NT, ND, bowel sounds +- drain in  RUQ noted- green/brown liquid in bag Extremities: no edema, no cyanosis   Discharge Instructions  Discharge Instructions    AMB Referral to [facility]   Complete by:  As directed    Please assign to [facility] for care coordination. Has multiple comorbidites including COPD, DM, HTN. Active with [facility] Social Worker for transportation. Active consent remains on file. PCP office Shelba Flake) listed as doing toc. Currently at [facility]. Please call with questions. Thanks. Marthenia Rolling, [place], RN,BSN-THN [facility] Liaison-[phone]   Reason for consult:  Please assign to [facility] RNCM   Diagnoses of:   COPD/ Pneumonia Diabetes     Expected date of contact:  1-3 days (reserved for hospital discharges)   Diet - low sodium heart healthy   Complete by:  As directed    Diet Carb Modified   Complete by:  As directed    Increase activity slowly   Complete by:  As directed      Allergies as of 09/19/2017      Reactions   Belsomra [suvorexant] Other (See Comments)   [place] [address] out of bed while asleep: "I'm waking up as I'm falling on the floor;" "Night terrors"   Enalapril Maleate Cough   Latex Hives   Nickel Other (See Comments)   Blisters  Pt has a titanium right and left knee - nickel causes skin irritations that form into blisters and sores   Other Other (See Comments)   Patient has Sarcoidosis and can't tolerate any metals   Iodine Other (See Comments)   Patient received IV contrast without pre-meds without any problems 09/12/17 kwl Mother was intolerant--CODED on CT table---pt never tried   Doxycycline Diarrhea, Nausea And Vomiting   Hydrochlorothiazide Other (See Comments)   Low potassium levels    Hydroxychloroquine Sulfate Other (See Comments)   Vision changes    Lyrica [pregabalin] Other (See Comments)   "Made me feel high"   Tape Other (See Comments)   Medical tape causes bruising      Medication List    STOP taking these medications    amoxicillin-clavulanate 875-125 MG tablet Commonly known as:  AUGMENTIN   chlorhexidine 0.12 % solution Commonly known as:  PERIDEX   potassium chloride 10 MEQ tablet Commonly known as:  K-DUR     TAKE these medications   accu-chek multiclix lancets Use as instructed to check sugars 4 times daily DX: E11.8   acetaminophen 325 MG tablet Commonly known as:  TYLENOL Take 2 tablets (650 mg total) by mouth every 6 (six) hours as needed for mild pain, fever or headache.   budesonide-formoterol 160-4.5 MCG/ACT inhaler Commonly known as:  SYMBICORT Inhale 2 puffs into the lungs TWICE DAILY 120/4=30   carvedilol 12.5 MG tablet Commonly known as:  COREG Take  1 tablet (12.5 mg total) by mouth 2 (two) times daily with a meal.   clonazePAM 0.5 MG tablet Commonly known as:  KLONOPIN Take 1-2 tablets (0.5-1 mg total) by mouth at bedtime.   esomeprazole 40 MG capsule Commonly known as:  NEXIUM Take 1 capsule (40 mg total) by mouth daily before breakfast.   furosemide 40 MG tablet Commonly known as:  LASIX TAKE 1 TABLET BY MOUTH EVERY MORNING FOR EDEMA What changed:  See the new instructions.   gabapentin 300 MG capsule Commonly known as:  NEURONTIN Take 1 capsule (300 mg total) by mouth 2 (two) times daily as needed (for pain).   glucose blood test strip Use as instructed to test sugars 4 times daily DX: E11.8   HUMIRA 40 MG/0.8ML Pskt Generic drug:  Adalimumab Inject 40 mg into the skin every 7 (seven) days.   JANUVIA 50 MG tablet Generic drug:  sitaGLIPtin Take 1 tablet (50 mg total) by mouth daily. What changed:  See the new instructions.   metFORMIN 500 MG 24 hr tablet Commonly known as:  GLUCOPHAGE-XR TAKE 2 TABLETS(1000 MG) BY MOUTH DAILY WITH BREAKFAST   oxyCODONE 5 MG immediate release tablet Commonly known as:  Oxy IR/ROXICODONE Take 1 tablet (5 mg total) by mouth every 4 (four) hours as needed for moderate pain.   polyethylene glycol packet Commonly known  as:  MIRALAX / GLYCOLAX Take 17 g by mouth daily.   potassium chloride 10 MEQ tablet Commonly known as:  K-DUR,KLOR-CON Take 10 mEq by mouth daily.   promethazine 25 MG tablet Commonly known as:  PHENERGAN Take 1 tablet (25 mg total) by mouth every 8 (eight) hours as needed for nausea or vomiting.   sulfamethoxazole-trimethoprim 400-80 MG tablet Commonly known as:  BACTRIM,SEPTRA Take 1 tablet by mouth every 12 (twelve) hours.   topiramate 50 MG tablet Commonly known as:  TOPAMAX Take 1 tablet (50 mg total) by mouth 2 (two) times daily.   venlafaxine XR 75 MG 24 hr capsule Commonly known as:  EFFEXOR-XR Take 1 capsule (75 mg total) by mouth daily with breakfast.   VENTOLIN HFA 108 (90 Base) MCG/ACT inhaler Generic drug:  albuterol Inhale 2 puffs into the lungs every 6 (six) hours as needed for wheezing or shortness of breath. 200/8=25 What changed:  See the new instructions.   VOLTAREN 1 % Gel Generic drug:  diclofenac sodium Apply 2 g topically 2 (two) times daily as needed (for pain). 100/4=25 What changed:  See the new instructions.   XARELTO 20 MG Tabs tablet Generic drug:  rivaroxaban Take 1 tablet (20 mg total) by mouth at bedtime.      Follow-up Information    Johnathan Hausen, MD Follow up.   Specialty:  General Surgery Why:  PRN Contact information: [address] [place] [address] [place], [place] Follow up.   Specialty:  Home Health Services Why:  For home health services Contact information: [place] [address] [phone]        Jacqulynn Cadet, MD. Schedule an appointment as soon as possible for a visit.   Specialties:  Interventional Radiology, Radiology Why:  You will need to call the office on Monday to obtain an appt to follow up at the "[facility]  Contact information: [place] [address] [place] [address] [phone]         Michel Bickers  M, MD Follow up.   Specialty:  Internal Medicine Why:  He plans to see you on 9/23- please call the office to confirm this appt.  Contact information: [place] [address] [phone]        Plotnikov, Evie Lacks, MD Follow up.   Specialty:  Internal Medicine Why:  by Wednesday- please ask for a Bmeta and CBC at this visit. Contact information: [address] [place] [address] [phone]          Allergies  Allergen Reactions  . Belsomra [Suvorexant] Other (See Comments)    Golden [place] out of bed while asleep: "I'm waking up as I'm falling on the floor;" "Night terrors"  . Enalapril Maleate Cough  . Latex Hives  . Nickel Other (See Comments)    Blisters  Pt has a titanium right and left knee - nickel causes skin irritations that form into blisters and sores  . Other Other (See Comments)    Patient has Sarcoidosis and can't tolerate any metals  . Iodine Other (See Comments)    Patient received IV contrast without pre-meds without any problems 09/12/17 kwl Mother was intolerant--CODED on CT table---pt never tried  . Doxycycline Diarrhea and Nausea And Vomiting  . Hydrochlorothiazide Other (See Comments)    Low potassium levels   . Hydroxychloroquine Sulfate Other (See Comments)    Vision changes   . Lyrica [Pregabalin] Other (See Comments)    "Made me feel high"  . Tape Other (See Comments)    Medical tape causes bruising     Procedures/Studies:   Dg Chest 2 View  Result Date: 09/12/2017 CLINICAL DATA:  Productive cough and chills EXAM: CHEST - 2 VIEW COMPARISON:  06/02/2017 FINDINGS: Cardiac enlargement with mild vascular congestion. Negative for edema or effusion. Negative for pneumonia IMPRESSION: Cardiac enlargement and mild vascular congestion. Negative for pneumonia or edema. Electronically Signed   By: Franchot Gallo M.D.   On: 09/12/2017 18:01   Ct Abdomen Pelvis W Contrast  Result Date: 09/12/2017 CLINICAL DATA:  Acute  generalized abdominal pain x2 weeks with diarrhea and weight loss. EXAM: CT ABDOMEN AND PELVIS WITH CONTRAST TECHNIQUE: Multidetector CT imaging of the abdomen and pelvis was performed using the standard protocol following bolus administration of intravenous contrast. CONTRAST:  167mL ISOVUE-300 IOPAMIDOL (ISOVUE-300) INJECTION 61% COMPARISON:  None. FINDINGS: Lower chest: Heart size is normal. Minimal atelectatic subpleural nodular density in the left lower lobe on axial series 6/11 most likely represents a small focus of atelectasis given its more band like appearance on coronal reformats. No effusion or pulmonary consolidation. Hepatobiliary: Distended gallbladder with transmural thickening and pericholecystic fluid consistent with acute cholecystitis is identified. Complicating this finding are areas of low attenuation involving the adjacent liver and gallbladder fossa with mild peripheral enhancement concerning for adjacent multifocal intrahepatic abscesses. A gallstone is identified. The common duct is normal. Otherwise, no enhancing mass of the liver is seen. Pancreas: No pancreatic ductal dilatation, enhancing mass or inflammation. Spleen: Normal size spleen. Adrenals/Urinary Tract: Normal bilateral adrenal glands. Symmetric enhancement of the kidneys with small cyst arising off the interpolar right kidney measuring 13 mm. No hydroureteronephrosis. The urinary bladder is unremarkable. Stomach/Bowel: Small hiatal hernia. Decompressed stomach with normal small bowel rotation. No small-bowel obstruction. The distal and terminal ileum are normal. Patient is status post appendectomy. Sympathetic thickening of the colon traversing the right upper quadrant and mid upper abdomen is identified secondary to the inflamed gallbladder. Otherwise, there is scattered colonic diverticulosis along  the descending colon with greater degree of sigmoid diverticulosis and circular muscle hypertrophy identified.  Vascular/Lymphatic: Aortoiliac atherosclerosis. No pathologically enlarged lymph nodes. Small subcentimeter short axis periportal lymph nodes likely reactive in etiology are noted. Reproductive: The uterus and adnexa are unremarkable. Other: No free air free fluid. Musculoskeletal: No acute nor aggressive osseous abnormality. Degenerative changes are present along the dorsal spine. IMPRESSION: Acute cholecystitis with markedly distended gallbladder, transmural thickening and pericholecystic inflammation identified. Complicating this finding are adjacent intrahepatic and pericholecystic fluid attenuating hypodensities with slight peripheral enhancement compatible with adjacent abscesses some which appear to be intrahepatic subjacent to the gallbladder fossa. 13 mm cyst arising from the interpolar right without worrisome features. Colonic diverticulosis without acute diverticulitis. Electronically Signed   By: Ashley Royalty M.D.   On: 09/12/2017 17:23   Ir Perc Cholecystostomy  Result Date: 09/13/2017 INDICATION: 57 year old female with acute cholecystitis. She is a poor operative candidate and presents for percutaneous cholecystostomy tube placement. EXAM: CHOLECYSTOSTOMY MEDICATIONS: Patient is on intravenous antibiotics as an inpatient. No additional prophylaxis administered. ANESTHESIA/SEDATION: Moderate (conscious) sedation was employed during this procedure. A total of Versed 2 mg and Fentanyl 50 mcg was administered intravenously. Moderate Sedation Time: 12 minutes. The patient's level of consciousness and vital signs were monitored continuously by radiology nursing throughout the procedure under my direct supervision. FLUOROSCOPY TIME:  Fluoroscopy Time: 0 minutes 53 seconds (54 mGy). COMPLICATIONS: None immediate. PROCEDURE: Informed written consent was obtained from the patient after a thorough discussion of the procedural risks, benefits and alternatives. All questions were addressed. Maximal Sterile  Barrier Technique was utilized including caps, mask, sterile gowns, sterile gloves, sterile drape, hand hygiene and skin antiseptic. A timeout was performed prior to the initiation of the procedure. Ultrasound was used to interrogate the right upper quadrant. The grossly inflamed and irregular gallbladder is successfully identified. A suitable skin entry site was selected and local anesthesia attained by infiltration with 1% lidocaine. A small dermatotomy was made. Under real-time sonographic guidance, a 21 gauge Accustick needle was advanced through the skin, through a short transhepatic course and into the lumen of the gallbladder. The 0.018 wire was then coiled in the distended gallbladder lumen. The Accustick needle was exchanged for the Accustick sheath. A gentle hand injection of contrast material was performed opacifying the gallbladder lumen. A 75 cm superstiff Amplatz wire was then advanced and coiled in the gallbladder lumen. The Accustick sheath was removed. The soft tissue tract was dilated to 10 [place] and a Cook 10.2 [place] all-purpose drainage catheter was advanced over the wire and formed in the gallbladder. There was return of foul-smelling milky bile. A sample was collected and sent for culture. A final gentle hand injection of contrast material was performed and a fluoroscopic spot image was saved to document the position of the drainage catheter. The tube was connected to gravity bag drainage and secured to the skin with 0 Prolene suture. The patient tolerated the procedure well. IMPRESSION: Successful placement of a 10 French transhepatic percutaneous cholecystostomy tube for acute cholecystitis. Electronically Signed   By: Jacqulynn Cadet M.D.   On: 09/13/2017 14:22   Dg Abd Portable 2v  Result Date: 09/15/2017 CLINICAL DATA:  57 year old female with increasing generalized abdominal pain EXAM: PORTABLE ABDOMEN - 2 VIEW COMPARISON:  CT abdomen/pelvis 09/12/2017 FINDINGS: No evidence of free  air on the lateral decubitus radiographs. The previously placed percutaneous cholecystostomy tube remains in unchanged position overlying the expected location of the gallbladder. The bowel gas pattern is not  obstructed. No abnormal calcifications. No acute osseous abnormality. IMPRESSION: 1. Well-positioned percutaneous cholecystostomy tube. 2. No evidence of free air or bowel obstruction. Electronically Signed   By: Jacqulynn Cadet M.D.   On: 09/15/2017 09:25   US Abdomen Limited Ruq  Result Date: 09/13/2017 CLINICAL DATA:  57 year old female with right upper quadrant abdominal pain x3 days. EXAM: ULTRASOUND ABDOMEN LIMITED RIGHT UPPER QUADRANT COMPARISON:  CT of the abdomen pelvis dated 09/12/2017 FINDINGS: Gallbladder: The gallbladder is distended. There gallbladder wall is irregular with multiple outpouchings. The gallbladder wall is thickened and edematous measuring 12 mm in thickness. There is sludge within the gallbladder. There is a small amount of pericholecystic fluid. Positive sonographic Murphy's sign. There is a 7.5 x 4.8 x 6.2 cm complex area adjacent to the gallbladder which may represent an abscess or a contained perforation. Common bile duct: Diameter: 4 mm Liver: There is diffuse increased liver echogenicity most consistent with fatty infiltration. Portal vein is patent on color Doppler imaging with normal direction of blood flow towards the liver. IMPRESSION: 1. Gallbladder sludge with findings of acute cholecystitis. Complex collection adjacent to the gallbladder may represent abscess or focally contained perforation. 2. Fatty liver. 3. Patent main portal vein with hepatopetal flow. Electronically Signed   By: Anner Crete M.D.   On: 09/13/2017 00:12     The results of significant diagnostics from this hospitalization (including imaging, microbiology, ancillary and laboratory) are listed below for reference.     Microbiology: Recent Results (from the past 240 hour(s))  Blood  Culture (routine x 2)     Status: None   Collection Time: 09/12/17  6:07 PM  Result Value Ref Range Status   Specimen Description   Final    BLOOD RIGHT ARM Performed at [facility] [address], [place], [place] [address]    Special Requests   Final    BOTTLES DRAWN AEROBIC AND ANAEROBIC Blood Culture adequate volume Performed at [facility] [address], [place], [place] [address]    Culture   Final    NO GROWTH 5 DAYS Performed at [facility], [place] [address], [place], [place] [address]    Report Status 09/17/2017 FINAL  Final  Blood Culture (routine x 2)     Status: None   Collection Time: 09/12/17  6:07 PM  Result Value Ref Range Status   Specimen Description   Final    BLOOD RIGHT ANTECUBITAL Performed at [facility] [address], [place], [place] [address]    Special Requests   Final    BOTTLES DRAWN AEROBIC AND ANAEROBIC Blood Culture adequate volume Performed at [facility] [address] [place], [place] [address]    Culture   Final    NO GROWTH 5 DAYS Performed at [facility], [place] [address] [place], [place] [address]    Report Status 09/17/2017 FINAL  Final  Surgical PCR screen     Status: Abnormal   Collection Time: 09/13/17  1:47 AM  Result Value Ref Range Status   MRSA, PCR NEGATIVE NEGATIVE Final   Staphylococcus aureus POSITIVE (A) NEGATIVE Final    Comment: (NOTE) The Xpert SA Assay (FDA approved for NASAL specimens in patients 73 years of age and older), is one component of a comprehensive surveillance program. It is not intended to diagnose infection nor to guide or monitor treatment. Performed at [facility], [place] [address], [place], [place] [address]  Aerobic/Anaerobic Culture (surgical/deep wound)     Status: None (Preliminary result)   Collection Time: 09/13/17  2:11 PM  Result Value Ref Range Status   Specimen  Description   Final    ABSCESS GALL BLADDER Performed at [facility] [address], [place], [place] [address]    Special Requests   Final    Normal Performed at [facility], [place] [address] [place], [place] [address]    Gram Stain   Final    FEW WBC PRESENT, PREDOMINANTLY PMN MODERATE GRAM POSITIVE COCCI IN CLUSTERS    Culture   Final    FEW STAPHYLOCOCCUS AUREUS SUSCEPTIBILITIES TO FOLLOW [facility] NO ANAEROBES ISOLATED Performed at [facility], [place] [address] [place], [place] [address]    Report Status PENDING  Incomplete     Labs: BNP (last 3 results) Recent Labs    09/12/17 1900  BNP 735.3*   Basic Metabolic Panel: Recent Labs  Lab 09/14/17 0436 09/15/17 0406 09/16/17 0455 09/17/17 0509 09/18/17 0517 09/19/17 0459  NA 142 144 143 146* 146*  --   K 3.5 3.6 3.3* 3.8 3.2*  --   CL 109 110 109 108 109  --   CO2 24 27 28 30 28   --   GLUCOSE 93 117* 109* 92 86  --   BUN 11 7 5* <5* 5*  --   CREATININE 1.11* 1.04* 0.96 1.17* 1.15* 1.17*  CALCIUM 8.2* 8.2* 8.2* 8.4* 8.2*  --   MG 2.2 2.3 2.3 2.2 2.0  --    Liver Function Tests: Recent Labs  Lab 09/13/17 0413 09/14/17 0436 09/15/17 0406 09/16/17 0455 09/17/17 0509  AST 127* 40 23 20 17   ALT 95* 57* 41 28 26  ALKPHOS 215* 150* 136* 129* 125  BILITOT 4.0* 1.1 0.8 0.6 0.5  PROT 7.3 6.3* 6.6 6.1* 6.2*  ALBUMIN 2.4* 2.0* 1.9* 1.9* 2.0*   Recent Labs  Lab 09/12/17 1510  LIPASE 34   No results for input(s): AMMONIA in the last 168 hours. CBC: Recent Labs  Lab 09/12/17 1510  09/14/17 0436 09/15/17 0406 09/16/17 0455 09/17/17 0509 09/18/17 0517  WBC 21.9*   < > 10.5 7.7 5.8 6.4 6.5  NEUTROABS 19.0*  --  7.9* 5.3 3.5 3.6  --   HGB 9.6*   < > 8.1* 9.1* 8.2* 8.7* 9.1*  HCT 30.7*   < > 27.4* 30.7* 27.5* 29.3* 30.6*  MCV 90.8   < > 93.2 93.6 93.9 93.9 94.2  PLT 349   < > 407* 493* 494* 549* 556*   < > = values in this interval not displayed.   Cardiac  Enzymes: No results for input(s): CKTOTAL, CKMB, CKMBINDEX, TROPONINI in the last 168 hours. BNP: Invalid input(s): POCBNP CBG: Recent Labs  Lab 09/18/17 0734 09/18/17 1210 09/18/17 1803 09/18/17 2219 09/19/17 0737  GLUCAP 85 97 91 90 88   D-Dimer No results for input(s): DDIMER in the last 72 hours. Hgb A1c No results for input(s): HGBA1C in the last 72 hours. Lipid Profile No results for input(s): CHOL, HDL, LDLCALC, TRIG, CHOLHDL, LDLDIRECT in the last 72 hours. Thyroid function studies No results for input(s): TSH, T4TOTAL, T3FREE, THYROIDAB in the last 72 hours.  Invalid input(s): FREET3 Anemia work up No results for input(s): VITAMINB12, FOLATE, FERRITIN, TIBC, IRON, RETICCTPCT in the last 72 hours. Urinalysis    Component Value Date/Time   COLORURINE AMBER (A) 09/12/2017 1807   APPEARANCEUR CLEAR 09/12/2017 1807  LABSPEC 1.044 (H) 09/12/2017 1807   PHURINE 6.0 09/12/2017 1807   GLUCOSEU NEGATIVE 09/12/2017 1807   GLUCOSEU NEGATIVE 04/27/2013 1218   HGBUR MODERATE (A) 09/12/2017 1807   BILIRUBINUR SMALL (A) 09/12/2017 1807   KETONESUR NEGATIVE 09/12/2017 1807   PROTEINUR NEGATIVE 09/12/2017 1807   UROBILINOGEN 1.0 11/08/2014 1050   NITRITE NEGATIVE 09/12/2017 1807   LEUKOCYTESUR NEGATIVE 09/12/2017 1807   Sepsis Labs Invalid input(s): PROCALCITONIN,  WBC,  LACTICIDVEN Microbiology Recent Results (from the past 240 hour(s))  Blood Culture (routine x 2)     Status: None   Collection Time: 09/12/17  6:07 PM  Result Value Ref Range Status   Specimen Description   Final    BLOOD RIGHT ARM Performed at [facility], [place] [address], [place], [place] [address]    Special Requests   Final    BOTTLES DRAWN AEROBIC AND ANAEROBIC Blood Culture adequate volume Performed at [facility] [address], [place], [place] [address]    Culture   Final    NO GROWTH 5 DAYS Performed at [facility], [place] [address],  [place], [place] [address]    Report Status 09/17/2017 FINAL  Final  Blood Culture (routine x 2)     Status: None   Collection Time: 09/12/17  6:07 PM  Result Value Ref Range Status   Specimen Description   Final    BLOOD RIGHT ANTECUBITAL Performed at [facility] [address], [place], [place] [address]    Special Requests   Final    BOTTLES DRAWN AEROBIC AND ANAEROBIC Blood Culture adequate volume Performed at [facility] [address] [place], [place] [address]    Culture   Final    NO GROWTH 5 DAYS Performed at [facility], [place] [address] [place], [place] [address]    Report Status 09/17/2017 FINAL  Final  Surgical PCR screen     Status: Abnormal   Collection Time: 09/13/17  1:47 AM  Result Value Ref Range Status   MRSA, PCR NEGATIVE NEGATIVE Final   Staphylococcus aureus POSITIVE (A) NEGATIVE Final    Comment: (NOTE) The Xpert SA Assay (FDA approved for NASAL specimens in patients 20 years of age and older), is one component of a comprehensive surveillance program. It is not intended to diagnose infection nor to guide or monitor treatment. Performed at [facility], [place] [address] [place], [place] [address]   Aerobic/Anaerobic Culture (surgical/deep wound)     Status: None (Preliminary result)   Collection Time: 09/13/17  2:11 PM  Result Value Ref Range Status   Specimen Description   Final    ABSCESS GALL BLADDER Performed at [facility] [address], [place], [place] [address]    Special Requests   Final    Normal Performed at [facility], [place] [address] [place], [place] [address]    Gram Stain   Final    FEW WBC PRESENT, PREDOMINANTLY PMN MODERATE GRAM POSITIVE COCCI IN CLUSTERS    Culture   Final    FEW STAPHYLOCOCCUS AUREUS SUSCEPTIBILITIES TO FOLLOW [facility] NO ANAEROBES ISOLATED Performed at [facility], [place] [address] [place],  [place] [address]    Report Status PENDING  Incomplete     Time coordinating discharge in minutes: 65  SIGNED:   Debbe Odea, MD  [facility] 09/19/2017, 11:23 AM Pager   If 7PM-7AM, please contact night-coverage [email or web address] Password  [email or web address]

## 2017-09-19 NOTE — Progress Notes (Signed)
Physical Therapy Treatment Patient Details Name: [name] MRN: [id number] DOB: [birth year] Today's Date: 09/19/2017    History of Present Illness 57 year old female with history of CAD, hypertension, diabetes, depression, DVT/PE, OSA, COPD on 2 L O2 and sarcoidosis Pt  presented with abdominal pain, nausea, vomiting and diarrhea.   CT abdomen showed acute cholecystitis with markedly distended gallbladder, adjacent intrahepatic and pericholecystic fluid concerning for abscess.  She was started on intravenous antibiotics and general surgery and  IR consulted and patient underwent percutaneous cholecystostomy tube placement by IR on 09/13/2017.    PT Comments    Pt reports feeling better today however also very tired (states she slept very little last night).  Pt motivated to ambulate as she reports she is likely to d/c home today.  Pt reports fatigue after ambulating however no other complaints.      Follow Up Recommendations  Home health PT;Supervision - Intermittent     Equipment Recommendations  None recommended by PT    Recommendations for Other Services       Precautions / Restrictions Precautions Precautions: Fall Precaution Comments: O2 dependent    Mobility  Bed Mobility Overal bed mobility: Modified Independent             General bed mobility comments: incr time, no assist  Transfers Overall transfer level: Needs assistance Equipment used: Rolling walker (2 wheeled) Transfers: Sit to/from Stand Sit to Stand: Min guard         General transfer comment: min/guard for safety, cues for hand placement   Ambulation/Gait Ambulation/Gait assistance: Min guard Gait Distance (Feet): 160 Feet Assistive device: Rolling walker (2 wheeled) Gait Pattern/deviations: Step-through pattern;Decreased stride length;Trunk flexed     General Gait Details: cues for posture and RW position; pt reports no dizziness today; improved distance   Stairs              Wheelchair Mobility    Modified Rankin (Stroke Patients Only)       Balance                                            Cognition Arousal/Alertness: Awake/alert Behavior During Therapy: WFL for tasks assessed/performed Overall Cognitive Status: Within Functional Limits for tasks assessed                                        Exercises      General Comments        Pertinent Vitals/Pain Pain Assessment: No/denies pain Pain Intervention(s): Repositioned;Monitored during session    Home Living                      Prior Function            PT Goals (current goals can now be found in the care plan section) Progress towards PT goals: Progressing toward goals    Frequency           PT Plan Current plan remains appropriate    Co-evaluation              AM-PAC PT "6 Clicks" Daily Activity  Outcome Measure  Difficulty turning over in bed (including adjusting bedclothes, sheets and blankets)?: A Little Difficulty moving from lying on back to sitting on the side of  the bed? : A Little Difficulty sitting down on and standing up from a chair with arms (e.g., wheelchair, bedside commode, etc,.)?: A Little Help needed moving to and from a bed to chair (including a wheelchair)?: A Little Help needed walking in hospital room?: A Little Help needed climbing 3-5 steps with a railing? : A Little 6 Click Score: 18    End of Session Equipment Utilized During Treatment: Oxygen Activity Tolerance: Patient tolerated treatment well Patient left: in bed;with bed alarm set;with call bell/phone within reach Nurse Communication: Mobility status PT Visit Diagnosis: Difficulty in walking, not elsewhere classified (R26.2);Muscle weakness (generalized) (M62.81)     Time: 5686-1683 PT Time Calculation (min) (ACUTE ONLY): 13 min  Charges:  $Gait Training: 8-22 mins                    [name], PT, DPT 09/19/2017 Pager:  [phone]   York Ram E 09/19/2017, 12:25 PM

## 2017-09-20 DIAGNOSIS — I5030 Unspecified diastolic (congestive) heart failure: Secondary | ICD-10-CM | POA: Diagnosis not present

## 2017-09-20 DIAGNOSIS — F419 Anxiety disorder, unspecified: Secondary | ICD-10-CM | POA: Diagnosis not present

## 2017-09-20 DIAGNOSIS — M069 Rheumatoid arthritis, unspecified: Secondary | ICD-10-CM | POA: Diagnosis not present

## 2017-09-20 DIAGNOSIS — J449 Chronic obstructive pulmonary disease, unspecified: Secondary | ICD-10-CM | POA: Diagnosis not present

## 2017-09-20 DIAGNOSIS — Z434 Encounter for attention to other artificial openings of digestive tract: Secondary | ICD-10-CM | POA: Diagnosis not present

## 2017-09-20 DIAGNOSIS — I2510 Atherosclerotic heart disease of native coronary artery without angina pectoris: Secondary | ICD-10-CM | POA: Diagnosis not present

## 2017-09-20 DIAGNOSIS — J9611 Chronic respiratory failure with hypoxia: Secondary | ICD-10-CM | POA: Diagnosis not present

## 2017-09-20 DIAGNOSIS — F329 Major depressive disorder, single episode, unspecified: Secondary | ICD-10-CM | POA: Diagnosis not present

## 2017-09-20 DIAGNOSIS — K219 Gastro-esophageal reflux disease without esophagitis: Secondary | ICD-10-CM | POA: Diagnosis not present

## 2017-09-20 DIAGNOSIS — Z86718 Personal history of other venous thrombosis and embolism: Secondary | ICD-10-CM | POA: Diagnosis not present

## 2017-09-20 DIAGNOSIS — Z9981 Dependence on supplemental oxygen: Secondary | ICD-10-CM | POA: Diagnosis not present

## 2017-09-20 DIAGNOSIS — Z96653 Presence of artificial knee joint, bilateral: Secondary | ICD-10-CM | POA: Diagnosis not present

## 2017-09-20 DIAGNOSIS — G4733 Obstructive sleep apnea (adult) (pediatric): Secondary | ICD-10-CM | POA: Diagnosis not present

## 2017-09-20 DIAGNOSIS — Z79891 Long term (current) use of opiate analgesic: Secondary | ICD-10-CM | POA: Diagnosis not present

## 2017-09-20 DIAGNOSIS — E441 Mild protein-calorie malnutrition: Secondary | ICD-10-CM | POA: Diagnosis not present

## 2017-09-20 DIAGNOSIS — M1990 Unspecified osteoarthritis, unspecified site: Secondary | ICD-10-CM | POA: Diagnosis not present

## 2017-09-20 DIAGNOSIS — Z8673 Personal history of transient ischemic attack (TIA), and cerebral infarction without residual deficits: Secondary | ICD-10-CM | POA: Diagnosis not present

## 2017-09-20 DIAGNOSIS — D519 Vitamin B12 deficiency anemia, unspecified: Secondary | ICD-10-CM | POA: Diagnosis not present

## 2017-09-20 DIAGNOSIS — I130 Hypertensive heart and chronic kidney disease with heart failure and stage 1 through stage 4 chronic kidney disease, or unspecified chronic kidney disease: Secondary | ICD-10-CM | POA: Diagnosis not present

## 2017-09-20 DIAGNOSIS — N182 Chronic kidney disease, stage 2 (mild): Secondary | ICD-10-CM | POA: Diagnosis not present

## 2017-09-20 DIAGNOSIS — K819 Cholecystitis, unspecified: Secondary | ICD-10-CM | POA: Diagnosis not present

## 2017-09-20 DIAGNOSIS — E1122 Type 2 diabetes mellitus with diabetic chronic kidney disease: Secondary | ICD-10-CM | POA: Diagnosis not present

## 2017-09-20 DIAGNOSIS — Z86711 Personal history of pulmonary embolism: Secondary | ICD-10-CM | POA: Diagnosis not present

## 2017-09-20 DIAGNOSIS — D869 Sarcoidosis, unspecified: Secondary | ICD-10-CM | POA: Diagnosis not present

## 2017-09-20 DIAGNOSIS — M797 Fibromyalgia: Secondary | ICD-10-CM | POA: Diagnosis not present

## 2017-09-21 DIAGNOSIS — I5030 Unspecified diastolic (congestive) heart failure: Secondary | ICD-10-CM | POA: Diagnosis not present

## 2017-09-21 DIAGNOSIS — J449 Chronic obstructive pulmonary disease, unspecified: Secondary | ICD-10-CM | POA: Diagnosis not present

## 2017-09-21 DIAGNOSIS — I130 Hypertensive heart and chronic kidney disease with heart failure and stage 1 through stage 4 chronic kidney disease, or unspecified chronic kidney disease: Secondary | ICD-10-CM | POA: Diagnosis not present

## 2017-09-21 DIAGNOSIS — J9611 Chronic respiratory failure with hypoxia: Secondary | ICD-10-CM | POA: Diagnosis not present

## 2017-09-21 DIAGNOSIS — Z434 Encounter for attention to other artificial openings of digestive tract: Secondary | ICD-10-CM | POA: Diagnosis not present

## 2017-09-21 DIAGNOSIS — K819 Cholecystitis, unspecified: Secondary | ICD-10-CM | POA: Diagnosis not present

## 2017-09-22 ENCOUNTER — Telehealth: Payer: Self-pay | Admitting: *Deleted

## 2017-09-22 ENCOUNTER — Other Ambulatory Visit: Payer: Self-pay | Admitting: Surgery

## 2017-09-22 ENCOUNTER — Telehealth: Payer: Self-pay | Admitting: Internal Medicine

## 2017-09-22 ENCOUNTER — Other Ambulatory Visit: Payer: Self-pay | Admitting: *Deleted

## 2017-09-22 DIAGNOSIS — K819 Cholecystitis, unspecified: Secondary | ICD-10-CM

## 2017-09-22 LAB — SUSCEPTIBILITY RESULT

## 2017-09-22 LAB — SUSCEPTIBILITY, AER + ANAEROB

## 2017-09-22 MED ORDER — BD LANCET ULTRAFINE 33G MISC: 1 refills | Status: DC

## 2017-09-22 MED ORDER — GLUCOSE BLOOD VI STRP: ORAL_STRIP | 1 refills | Status: DC

## 2017-09-22 MED ORDER — EZ SMART PLUS MONITORING SYS DEVI: 1.0000 | Freq: Once | 0 refills | Status: AC

## 2017-09-22 NOTE — Telephone Encounter (Signed)
Verbal orders given to [name]. I also spoke with supervisor and they will rerun saline through patients insurance to try to get her saline for her tube flushes for a more affordable price.

## 2017-09-22 NOTE — Telephone Encounter (Signed)
Patient needs RX for EZ Plus 2 meter AND EZ talk test strips sent to [facility] (insurance no longer cover [facility])

## 2017-09-22 NOTE — Patient Outreach (Signed)
[place] [facility]) Care Management  09/22/2017  [place] Nov 29, 1960 [id number]   Referral received from hospital liaison as member was recently admitted for cholecystitis, discharged 9/6.  Primary MD office will complete transition of care assessment.  Per chart, she also has history of hypertension, CAD, COPD, GERD, Diabetes, anxiety and depression.  Call placed to member, no answer.  HIPAA compliant voice message left, will await call back. Unsuccessful outreach letter sent, will follow up within the next 4 business days.       Update @ 1630:  Voice message received from member after missed call.  Call placed back to member.  Identity verified.  This care manager introduces self and purpose of call.  [facility] care management services explained.  She report she is "ok."  Report [facility] has started services, first visit was Saturday, 9/7.  She has percutaneous drain where she has to flush every 8 hours.  She is very concerned as she will run out of 102ml syringes tomorrow morning.  The prescription was sent to [facility], but her insurance did not cover it.  She has called the MD office, noted that the office has reached out to both [facility] and [facility].    [facility] pharmacist, K. Luciana Axe, was previously involved in case, however she state she has not been contacted by office.  Call placed to [facility], they are able to provide the syringes with next visit which is scheduled for 9/10.    She report she is taking all medications as prescribed, denies any questions/problems.  Appointments scheduled for primary MD on 9/11, ID on 9/23, and IR on 9/25.  Denies transportation being a barrier.  She denies any urgent concerns, agrees to home visit next week.  [facility] CM Care Plan Problem One     Most Recent Value  Care Plan Problem One  Risk for admision related to cholecystitic as evidenced by recent hospitalization  Role Documenting the Problem One  Care Management  [place] for Problem One  Active  [facility] Long Term Goal   Member wil not have any readmissions within the next 31 days.  [facility] Long Term Goal Start Date  09/22/17  Interventions for Problem One Long Term Goal  Discussed with member the importance of following discharge instructions, including follow up appointments, medications, diet, and home health involvement, to decrease the risk of readmission.  Educated on signs/symptoms of infections  [facility] CM Short Term Goal #1   Member will report flushing drain 3 times a day as insructed over the next 4 weeks  [facility] CM Short Term Goal #1 Start Date  09/22/17  Interventions for Short Term Goal #1  Patient educated on the importance of flushing as instructed to ensure patency of drain tube.  Call placed to [facility] to confirm they would provide 26ml syringes.  [facility] CM Short Term Goal #2   Member will keep and attend follow up appointments (primary, ID, radiology and GI) within the next 3 weeks  [facility] CM Short Term Goal #2 Start Date  09/22/17  Interventions for Short Term Goal #2  All upcoming appointments reviewed with member.  Transportation discussed, confirmed she will have transportation to all appointments.  Advised to contact SW if not.     Valente David, [place], MSN [place] [phone]

## 2017-09-22 NOTE — Telephone Encounter (Signed)
Sent!

## 2017-09-22 NOTE — Telephone Encounter (Signed)
Caller name: Nicolette Relation to pt: LPN from [facility]  Call back number: [phone]    Reason for call:  Verbal orders home health skilled nursing 1x 1, 3x 2, 2x 2, 1x every other week 4 weeks, 3 PRN visit

## 2017-09-22 NOTE — Telephone Encounter (Signed)
Per chart review: PCP: Plotnikov, Evie Lacks, MD  Admit date: 09/12/2017 Discharge date: 09/19/2017  Admitted From: home  Disposition:  home   Recommendations for Outpatient Follow-up:  1. Please f//u Bmet and CBC in 4-5 days  Home Health:  ordered    Discharge Condition:  stable   CODE STATUS:  Full code   Consultants:  IR  Gen surgery  ID Procedures:  Percutaneous drain in RUQ/ Gallbladder __________________________________________________________ Per phone call:  Transition Care Management Follow-up Telephone Call   Date discharged? 09/19/17   How have you been since you were released from the hospital? "ok"   Do you understand why you were in the hospital? yes   Do you understand the discharge instructions? yes   Where were you discharged to? Home   Items Reviewed:  Medications reviewed: yes  Allergies reviewed: yes  Dietary changes reviewed: yes  Referrals reviewed: yes   Functional Questionnaire:   Activities of Daily Living (ADLs):   She states they are independent in the following: ambulation, bathing and hygiene, feeding, continence, grooming, toileting and dressing States they require assistance with the following: awaiting home health to come back out.    Any transportation issues/concerns?: no   Any patient concerns? Yes. Patient cannot afford 10 cc syringes needed to flush perc tube. I called [facility] as well as [facility] to see if they can assist patient in getting her supplies . Both agreed to look at this and get back with patient.   Confirmed importance and date/time of follow-up visits scheduled yes  Provider Appointment booked with Dr Alain Marion 09/24/17 2:40  Confirmed with patient if condition begins to worsen call PCP or go to the ER.  Patient was given the office number and encouraged to call back with question or concerns.  : yes

## 2017-09-22 NOTE — Telephone Encounter (Signed)
Verbal orders were given by [facility], FYI

## 2017-09-22 NOTE — Telephone Encounter (Signed)
Copied from [place] [phone]. Topic: Quick Communication - See Telephone Encounter >> Sep 22, 2017  8:55 AM Sheran Luz wrote: CRM for notification. See Telephone encounter for: 09/22/17.   Dan Europe, physical therapist with [facility] called requesting verbal orders for pt for 2xw for 5w effective 9/8.   [phone] Ok to leave [employer] is password protected.

## 2017-09-23 ENCOUNTER — Other Ambulatory Visit: Payer: Self-pay

## 2017-09-23 DIAGNOSIS — J449 Chronic obstructive pulmonary disease, unspecified: Secondary | ICD-10-CM | POA: Diagnosis not present

## 2017-09-23 DIAGNOSIS — I130 Hypertensive heart and chronic kidney disease with heart failure and stage 1 through stage 4 chronic kidney disease, or unspecified chronic kidney disease: Secondary | ICD-10-CM | POA: Diagnosis not present

## 2017-09-23 DIAGNOSIS — J9611 Chronic respiratory failure with hypoxia: Secondary | ICD-10-CM | POA: Diagnosis not present

## 2017-09-23 DIAGNOSIS — K819 Cholecystitis, unspecified: Secondary | ICD-10-CM | POA: Diagnosis not present

## 2017-09-23 DIAGNOSIS — Z434 Encounter for attention to other artificial openings of digestive tract: Secondary | ICD-10-CM | POA: Diagnosis not present

## 2017-09-23 DIAGNOSIS — I5030 Unspecified diastolic (congestive) heart failure: Secondary | ICD-10-CM | POA: Diagnosis not present

## 2017-09-23 NOTE — Patient Outreach (Signed)
[name] [name]) Care Management  09/23/2017  [name] 08-06-1960 [id number]  Successful outreach to the patient, HIPAA identifiers confirmed. The patients reports "doing okay" since returning home. The patient has decided to postpone pursuing SCAT services at this time due to "having a bag connected to my bladder". The patient reports plans to continue utilizing [employer] transportation via her Medicaid benefit for appointments. The patient denies other social work needs at this time including food or Emergency planning/management officer needs.  BSW to perform a discipline closure at this time. The patient agrees with this closure and understands she may let her [facility] RNCM know if a new referral for social work support is needed.  Daneen Schick, BSW, CDP [facility] [phone]

## 2017-09-24 ENCOUNTER — Ambulatory Visit (INDEPENDENT_AMBULATORY_CARE_PROVIDER_SITE_OTHER): Payer: Medicare Other | Admitting: Internal Medicine

## 2017-09-24 ENCOUNTER — Ambulatory Visit: Payer: Self-pay

## 2017-09-24 ENCOUNTER — Encounter: Payer: Self-pay | Admitting: Internal Medicine

## 2017-09-24 ENCOUNTER — Other Ambulatory Visit (INDEPENDENT_AMBULATORY_CARE_PROVIDER_SITE_OTHER): Payer: Medicare Other

## 2017-09-24 VITALS — BP 134/76 | HR 69 | Temp 98.4°F | Ht 67.0 in | Wt 257.0 lb

## 2017-09-24 DIAGNOSIS — F411 Generalized anxiety disorder: Secondary | ICD-10-CM | POA: Diagnosis not present

## 2017-09-24 DIAGNOSIS — E538 Deficiency of other specified B group vitamins: Secondary | ICD-10-CM

## 2017-09-24 DIAGNOSIS — D509 Iron deficiency anemia, unspecified: Secondary | ICD-10-CM | POA: Diagnosis not present

## 2017-09-24 DIAGNOSIS — I2782 Chronic pulmonary embolism: Secondary | ICD-10-CM

## 2017-09-24 DIAGNOSIS — K810 Acute cholecystitis: Secondary | ICD-10-CM | POA: Diagnosis not present

## 2017-09-24 DIAGNOSIS — Z23 Encounter for immunization: Secondary | ICD-10-CM | POA: Diagnosis not present

## 2017-09-24 DIAGNOSIS — J449 Chronic obstructive pulmonary disease, unspecified: Secondary | ICD-10-CM | POA: Diagnosis not present

## 2017-09-24 DIAGNOSIS — I10 Essential (primary) hypertension: Secondary | ICD-10-CM

## 2017-09-24 DIAGNOSIS — N182 Chronic kidney disease, stage 2 (mild): Secondary | ICD-10-CM

## 2017-09-24 DIAGNOSIS — R635 Abnormal weight gain: Secondary | ICD-10-CM

## 2017-09-24 DIAGNOSIS — E1122 Type 2 diabetes mellitus with diabetic chronic kidney disease: Secondary | ICD-10-CM | POA: Diagnosis not present

## 2017-09-24 DIAGNOSIS — D649 Anemia, unspecified: Secondary | ICD-10-CM | POA: Insufficient documentation

## 2017-09-24 LAB — AEROBIC/ANAEROBIC CULTURE W GRAM STAIN (SURGICAL/DEEP WOUND): Special Requests: NORMAL

## 2017-09-24 LAB — IRON,TIBC AND FERRITIN PANEL
%SAT: 26 % (calc) (ref 16–45)
Ferritin: 78 ng/mL (ref 16–232)
Iron: 78 ug/dL (ref 45–160)
TIBC: 297 mcg/dL (calc) (ref 250–450)

## 2017-09-24 LAB — CBC WITH DIFFERENTIAL/PLATELET
Basophils Absolute: 0.1 10*3/uL (ref 0.0–0.1)
Basophils Relative: 1.5 % (ref 0.0–3.0)
EOS PCT: 2.1 % (ref 0.0–5.0)
Eosinophils Absolute: 0.2 10*3/uL (ref 0.0–0.7)
HCT: 35.1 % — ABNORMAL LOW (ref 36.0–46.0)
Hemoglobin: 11 g/dL — ABNORMAL LOW (ref 12.0–15.0)
LYMPHS ABS: 2.7 10*3/uL (ref 0.7–4.0)
Lymphocytes Relative: 37.2 % (ref 12.0–46.0)
MCHC: 31.4 g/dL (ref 30.0–36.0)
MCV: 88.4 fl (ref 78.0–100.0)
MONOS PCT: 7.5 % (ref 3.0–12.0)
Monocytes Absolute: 0.5 10*3/uL (ref 0.1–1.0)
NEUTROS ABS: 3.8 10*3/uL (ref 1.4–7.7)
NEUTROS PCT: 51.7 % (ref 43.0–77.0)
PLATELETS: 339 10*3/uL (ref 150.0–400.0)
RBC: 3.97 Mil/uL (ref 3.87–5.11)
RDW: 17.5 % — ABNORMAL HIGH (ref 11.5–15.5)
WBC: 7.3 10*3/uL (ref 4.0–10.5)

## 2017-09-24 LAB — BASIC METABOLIC PANEL
BUN: 7 mg/dL (ref 6–23)
CALCIUM: 8.8 mg/dL (ref 8.4–10.5)
CO2: 25 meq/L (ref 19–32)
CREATININE: 1.15 mg/dL (ref 0.40–1.20)
Chloride: 108 mEq/L (ref 96–112)
GFR: 51.66 mL/min — ABNORMAL LOW (ref >60.00)
Glucose, Bld: 97 mg/dL (ref 70–99)
Potassium: 3.8 mEq/L (ref 3.5–5.1)
Sodium: 140 mEq/L (ref 135–145)

## 2017-09-24 LAB — HEPATIC FUNCTION PANEL
ALBUMIN: 3.2 g/dL — AB (ref 3.5–5.2)
ALK PHOS: 296 U/L — AB (ref 39–117)
ALT: 25 U/L (ref 0–35)
AST: 40 U/L — AB (ref 0–37)
Bilirubin, Direct: 0.1 mg/dL (ref 0.0–0.3)
TOTAL PROTEIN: 7.1 g/dL (ref 6.0–8.3)
Total Bilirubin: 0.5 mg/dL (ref 0.2–1.2)

## 2017-09-24 LAB — AEROBIC/ANAEROBIC CULTURE (SURGICAL/DEEP WOUND)

## 2017-09-24 MED ORDER — POTASSIUM CHLORIDE ER 10 MEQ PO TBCR: 10.0000 meq | EXTENDED_RELEASE_TABLET | Freq: Every day | ORAL | 0 refills | Status: DC

## 2017-09-24 MED ORDER — CYANOCOBALAMIN 1000 MCG/ML IJ SOLN
1000.0000 ug | Freq: Once | INTRAMUSCULAR | Status: AC
  Administered 2017-09-24: 1000 ug via INTRAMUSCULAR

## 2017-09-24 NOTE — Assessment & Plan Note (Signed)
Wt Readings from Last 3 Encounters:  09/24/17 257 lb (116.6 kg)  09/18/17 271 lb 2.7 oz (123 kg)  08/27/17 280 lb (127 kg)

## 2017-09-24 NOTE — Assessment & Plan Note (Signed)
On Metformin, Januvia 

## 2017-09-24 NOTE — Assessment & Plan Note (Signed)
[facility]   Lexapro, Pamelor

## 2017-09-24 NOTE — Assessment & Plan Note (Signed)
B12 inj 

## 2017-09-24 NOTE — Assessment & Plan Note (Signed)
Coreg Lasix 

## 2017-09-24 NOTE — Assessment & Plan Note (Signed)
O2

## 2017-09-24 NOTE — Assessment & Plan Note (Signed)
Records reviewed Drain in place - f/u w/IR On Bactrim DS per ID

## 2017-09-24 NOTE — Addendum Note (Signed)
Addended by: Karren Cobble on: 09/24/2017 03:34 PM   Modules accepted: Orders

## 2017-09-24 NOTE — Assessment & Plan Note (Signed)
Labs

## 2017-09-24 NOTE — Assessment & Plan Note (Signed)
Xarelto O2 [place]

## 2017-09-24 NOTE — Progress Notes (Signed)
Subjective:  Patient ID: [name], female    DOB: 1960-09-25  Age: 57 y.o. MRN: [id number]  CC: No chief complaint on file.   HPI JOHNELL LANDOWSKI presents for GB attack, sarcoidosis, LBP f/u. D/c'd on 9/6 with a GB drain placed. Bactrim DS x 2 weeks per ID  Per Hx:  " Discharge Diagnoses:  Principal Problem:   Acute cholecystitis Active Problems:   Sepsis ([place])   Sarcoidosis   Major depressive disorder, recurrent episode ([place])   Essential hypertension   Obesity   CAD in native artery   Controlled type 2 diabetes mellitus with chronic kidney disease, without long-term current use of insulin ([facility])   Pulmonary embolism ([facility])   OSA (obstructive sleep apnea)   Diastolic dysfunction   Migraines   Brief Summary: Angela Bors [place] a 57 y.o.femalewith medical history significant ofCAD, hypertension, diabetes, depression, DVT/PE on Xarelto, OSA,dermal and pulmonary Sarcoidosison 2 L by nasal cannula onHumira who presented with 2 weeks of nausea, vomiting, diarrhea and abdominal pain. ED>vital signs:fever to 100.5, BP 90s/50s  CT abdomen>acute cholecystitis and markedly distended gallbladder,adjacent intrahepatic and pericholecystic fluid concerning for abscess WBC 21.9  Hospital Course:  Principal Problem: Cholecystitis with possible intrahepatic and pericholecystic abscess Sepsis  Elevated LFTs - immune compromised due to Humira use - gen surgery has decided that due to her pulmonary sarcoid, she is not a good candidate for surgery at this time- gen surgery signed offon 9/4 - perc drain placed on 8/31 by IR - few staph aureus growing in biliary fluid culture- MRSA PCR + blood cultures NGTD - her symptoms of pain and nausea have resolved and she has been eating and drinking well - IR recommends f/u in drain clinic in 2-4 wks,once daily irrigation of drain with 5-10 cc sterile NS, recording of output and dressing changes every 1-2  days; - 550 cc out yesterday from drain - LFTs and WBC normalized - I have asked ID for further assistance- she has been started on Bactrim DS BID for 2 wks per Dr Hale Bogus recommendations- she is recommended to f/u with him on 10/06/17 - she has learned drain care in the hospital and home health RNs will follow her as well  Active Problems: 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 Chronic hypoxic respiratory failure on 2 L O2 at baseline due to sarcoidosis - quite stable on 2 L during the hospital stay - recommend holding Humira until infection resolves (she has not taken it for the past 3 wks as she ran out of her prescription) - managed by Dr Marlowe Alt outpt  Major depressive disorder, recurrent episode  - cont Effexor and Clonazepam  Essential hypertension/ mild LVH and EF 60-65% on ECHO -continue coreg - cont to hold Lasix (on it PRN) as she is not fluid overlaoded  Controlled type 2 diabetes mellitus  - Januvia and Metformin on hold- cont SSI- sugars stable  CKD 2-3  - stable   Pulmonary embolism / DVT history - cont Xarelto  Mild thrombocytosis - f/u as outpt"     Outpatient Medications Prior to Visit  Medication Sig Dispense Refill  . acetaminophen (TYLENOL) 325 MG tablet Take 2 tablets (650 mg total) by mouth every 6 (six) hours as needed for mild pain, fever or headache.    . Adalimumab (HUMIRA) 40 MG/0.8ML PSKT Inject 40 mg into the skin every 7 (seven) days.     . B-D ULTRA-FINE 33 LANCETS MISC Use as instructed to test sugars 4 times  daily DX: E11.8 400 each 1  . budesonide-formoterol (SYMBICORT) 160-4.5 MCG/ACT inhaler Inhale 2 puffs into the lungs TWICE DAILY 120/4=30 10.2 g 5  . carvedilol (COREG) 12.5 MG tablet Take 1 tablet (12.5 mg total) by mouth 2 (two) times daily with a meal. 180 tablet 3  . clonazePAM (KLONOPIN) 0.5 MG tablet Take 1-2 tablets (0.5-1 mg total) by mouth at bedtime. 60 tablet 3  . esomeprazole (NEXIUM) 40 MG  capsule Take 1 capsule (40 mg total) by mouth daily before breakfast. 90 capsule 1  . furosemide (LASIX) 40 MG tablet TAKE 1 TABLET BY MOUTH EVERY MORNING FOR EDEMA (Patient taking differently: Take 20 mg by mouth daily as needed for fluid or edema. ) 90 tablet 1  . gabapentin (NEURONTIN) 300 MG capsule Take 1 capsule (300 mg total) by mouth 2 (two) times daily as needed (for pain). 180 capsule 1  . glucose blood (EZ SMART PLUS GLUCOSE TEST) test strip Use as instructed to test sugars 4 times daily DX: E11.8 400 each 1  . JANUVIA 50 MG tablet Take 1 tablet (50 mg total) by mouth daily. (Patient taking differently: Take 50 mg by mouth daily. ) 90 tablet 0  . metFORMIN (GLUCOPHAGE-XR) 500 MG 24 hr tablet TAKE 2 TABLETS(1000 MG) BY MOUTH DAILY WITH BREAKFAST 180 tablet 1  . oxyCODONE (OXY IR/ROXICODONE) 5 MG immediate release tablet Take 1 tablet (5 mg total) by mouth every 4 (four) hours as needed for moderate pain. 20 tablet 0  . polyethylene glycol (MIRALAX / GLYCOLAX) packet Take 17 g by mouth daily. 14 each 0  . potassium chloride (K-DUR,KLOR-CON) 10 MEQ tablet Take 10 mEq by mouth daily.   3  . promethazine (PHENERGAN) 25 MG tablet Take 1 tablet (25 mg total) by mouth every 8 (eight) hours as needed for nausea or vomiting. 20 tablet 0  . sulfamethoxazole-trimethoprim (BACTRIM,SEPTRA) 400-80 MG tablet Take 1 tablet by mouth every 12 (twelve) hours. 13 tablet 0  . topiramate (TOPAMAX) 50 MG tablet Take 1 tablet (50 mg total) by mouth 2 (two) times daily. 180 tablet 1  . venlafaxine XR (EFFEXOR-XR) 75 MG 24 hr capsule Take 1 capsule (75 mg total) by mouth daily with breakfast. 30 capsule 11  . VENTOLIN HFA 108 (90 Base) MCG/ACT inhaler Inhale 2 puffs into the lungs every 6 (six) hours as needed for wheezing or shortness of breath. 200/8=25 (Patient taking differently: Inhale 2 puffs into the lungs every 6 (six) hours as needed for wheezing or shortness of breath. ) 18 g 11  . VOLTAREN 1 % GEL Apply 2 g  topically 2 (two) times daily as needed (for pain). 100/4=25 (Patient taking differently: Apply 4 g topically 4 (four) times daily as needed (pain). ) 100 g 2  . XARELTO 20 MG TABS tablet Take 1 tablet (20 mg total) by mouth at bedtime. 90 tablet 3   No facility-administered medications prior to visit.     ROS: Review of Systems  Constitutional: Positive for fatigue. Negative for activity change, appetite change, chills and unexpected weight change.  HENT: Negative for congestion, mouth sores and sinus pressure.   Eyes: Negative for visual disturbance.  Respiratory: Negative for cough and chest tightness.   Gastrointestinal: Positive for abdominal distention. Negative for abdominal pain and nausea.  Genitourinary: Negative for difficulty urinating, frequency and vaginal pain.  Musculoskeletal: Positive for arthralgias, back pain and gait problem.  Skin: Positive for wound. Negative for pallor and rash.  Neurological: Negative for dizziness,  tremors, weakness, numbness and headaches.  Psychiatric/Behavioral: Negative for confusion, sleep disturbance and suicidal ideas. The patient is not nervous/anxious.     Objective:  BP 134/76 (BP Location: Right Arm, Patient Position: Sitting, Cuff Size: Large)   Pulse 69   Temp 98.4 F (36.9 C) (Oral)   Ht 5\' 7"  (1.702 m)   Wt 257 lb (116.6 kg)   SpO2 96%   BMI 40.25 kg/m   BP Readings from Last 3 Encounters:  09/24/17 134/76  09/19/17 (!) 150/75  08/27/17 132/78    Wt Readings from Last 3 Encounters:  09/24/17 257 lb (116.6 kg)  09/18/17 271 lb 2.7 oz (123 kg)  08/27/17 280 lb (127 kg)    Physical Exam  Constitutional: She appears well-developed. No distress.  HENT:  Head: Normocephalic.  Right Ear: External ear normal.  Left Ear: External ear normal.  Nose: Nose normal.  Mouth/Throat: Oropharynx is clear and moist.  Eyes: Pupils are equal, round, and reactive to light. Conjunctivae are normal. Right eye exhibits no discharge.  Left eye exhibits no discharge.  Neck: Normal range of motion. Neck supple. No JVD present. No tracheal deviation present. No thyromegaly present.  Cardiovascular: Normal rate, regular rhythm and normal heart sounds.  Pulmonary/Chest: No stridor. No respiratory distress. She has no wheezes.  Abdominal: Soft. Bowel sounds are normal. She exhibits no distension and no mass. There is tenderness. There is no rebound and no guarding.  Musculoskeletal: She exhibits no edema or tenderness.  Lymphadenopathy:    She has no cervical adenopathy.  Neurological: She displays normal reflexes. No cranial nerve deficit. She exhibits normal muscle tone. Coordination abnormal.  Skin: No rash noted. No erythema.  Psychiatric: She has a normal mood and affect. Her behavior is normal. Judgment and thought content normal.  Obese Walker GB drain w/a bag R A/o/c  Lab Results  Component Value Date   WBC 6.5 09/18/2017   HGB 9.1 (L) 09/18/2017   HCT 30.6 (L) 09/18/2017   PLT 556 (H) 09/18/2017   GLUCOSE 86 09/18/2017   CHOL 237 (H) 06/30/2017   TRIG 271.0 (H) 06/30/2017   HDL 70.00 06/30/2017   LDLDIRECT 107.0 06/30/2017   LDLCALC 97 06/29/2008   ALT 26 09/17/2017   AST 17 09/17/2017   NA 146 (H) 09/18/2017   K 3.2 (L) 09/18/2017   CL 109 09/18/2017   CREATININE 1.17 (H) 09/19/2017   BUN 5 (L) 09/18/2017   CO2 28 09/18/2017   TSH 2.31 08/23/2016   INR 0.93 06/26/2017   HGBA1C 5.8 (H) 06/26/2017   MICROALBUR <0.7 02/01/2015    Dg Chest 2 View  Result Date: 09/12/2017 CLINICAL DATA:  Productive cough and chills EXAM: CHEST - 2 VIEW COMPARISON:  06/02/2017 FINDINGS: Cardiac enlargement with mild vascular congestion. Negative for edema or effusion. Negative for pneumonia IMPRESSION: Cardiac enlargement and mild vascular congestion. Negative for pneumonia or edema. Electronically Signed   By: Franchot Gallo M.D.   On: 09/12/2017 18:01   Ct Abdomen Pelvis W Contrast  Result Date: 09/12/2017 CLINICAL  DATA:  Acute generalized abdominal pain x2 weeks with diarrhea and weight loss. EXAM: CT ABDOMEN AND PELVIS WITH CONTRAST TECHNIQUE: Multidetector CT imaging of the abdomen and pelvis was performed using the standard protocol following bolus administration of intravenous contrast. CONTRAST:  174mL ISOVUE-300 IOPAMIDOL (ISOVUE-300) INJECTION 61% COMPARISON:  None. FINDINGS: Lower chest: Heart size is normal. Minimal atelectatic subpleural nodular density in the left lower lobe on axial series 6/11 most likely represents a  small focus of atelectasis given its more band like appearance on coronal reformats. No effusion or pulmonary consolidation. Hepatobiliary: Distended gallbladder with transmural thickening and pericholecystic fluid consistent with acute cholecystitis is identified. Complicating this finding are areas of low attenuation involving the adjacent liver and gallbladder fossa with mild peripheral enhancement concerning for adjacent multifocal intrahepatic abscesses. A gallstone is identified. The common duct is normal. Otherwise, no enhancing mass of the liver is seen. Pancreas: No pancreatic ductal dilatation, enhancing mass or inflammation. Spleen: Normal size spleen. Adrenals/Urinary Tract: Normal bilateral adrenal glands. Symmetric enhancement of the kidneys with small cyst arising off the interpolar right kidney measuring 13 mm. No hydroureteronephrosis. The urinary bladder is unremarkable. Stomach/Bowel: Small hiatal hernia. Decompressed stomach with normal small bowel rotation. No small-bowel obstruction. The distal and terminal ileum are normal. Patient is status post appendectomy. Sympathetic thickening of the colon traversing the right upper quadrant and mid upper abdomen is identified secondary to the inflamed gallbladder. Otherwise, there is scattered colonic diverticulosis along the descending colon with greater degree of sigmoid diverticulosis and circular muscle hypertrophy identified.  Vascular/Lymphatic: Aortoiliac atherosclerosis. No pathologically enlarged lymph nodes. Small subcentimeter short axis periportal lymph nodes likely reactive in etiology are noted. Reproductive: The uterus and adnexa are unremarkable. Other: No free air free fluid. Musculoskeletal: No acute nor aggressive osseous abnormality. Degenerative changes are present along the dorsal spine. IMPRESSION: Acute cholecystitis with markedly distended gallbladder, transmural thickening and pericholecystic inflammation identified. Complicating this finding are adjacent intrahepatic and pericholecystic fluid attenuating hypodensities with slight peripheral enhancement compatible with adjacent abscesses some which appear to be intrahepatic subjacent to the gallbladder fossa. 13 mm cyst arising from the interpolar right without worrisome features. Colonic diverticulosis without acute diverticulitis. Electronically Signed   By: Ashley Royalty M.D.   On: 09/12/2017 17:23   Ir Perc Cholecystostomy  Result Date: 09/13/2017 INDICATION: 57 year old female with acute cholecystitis. She is a poor operative candidate and presents for percutaneous cholecystostomy tube placement. EXAM: CHOLECYSTOSTOMY MEDICATIONS: Patient is on intravenous antibiotics as an inpatient. No additional prophylaxis administered. ANESTHESIA/SEDATION: Moderate (conscious) sedation was employed during this procedure. A total of Versed 2 mg and Fentanyl 50 mcg was administered intravenously. Moderate Sedation Time: 12 minutes. The patient's level of consciousness and vital signs were monitored continuously by radiology nursing throughout the procedure under my direct supervision. FLUOROSCOPY TIME:  Fluoroscopy Time: 0 minutes 53 seconds (54 mGy). COMPLICATIONS: None immediate. PROCEDURE: Informed written consent was obtained from the patient after a thorough discussion of the procedural risks, benefits and alternatives. All questions were addressed. Maximal Sterile  Barrier Technique was utilized including caps, mask, sterile gowns, sterile gloves, sterile drape, hand hygiene and skin antiseptic. A timeout was performed prior to the initiation of the procedure. Ultrasound was used to interrogate the right upper quadrant. The grossly inflamed and irregular gallbladder is successfully identified. A suitable skin entry site was selected and local anesthesia attained by infiltration with 1% lidocaine. A small dermatotomy was made. Under real-time sonographic guidance, a 21 gauge Accustick needle was advanced through the skin, through a short transhepatic course and into the lumen of the gallbladder. The 0.018 wire was then coiled in the distended gallbladder lumen. The Accustick needle was exchanged for the Accustick sheath. A gentle hand injection of contrast material was performed opacifying the gallbladder lumen. A 75 cm superstiff Amplatz wire was then advanced and coiled in the gallbladder lumen. The Accustick sheath was removed. The soft tissue tract was dilated to 10 [place] and a  Cook 10.2 [place] all-purpose drainage catheter was advanced over the wire and formed in the gallbladder. There was return of foul-smelling milky bile. A sample was collected and sent for culture. A final gentle hand injection of contrast material was performed and a fluoroscopic spot image was saved to document the position of the drainage catheter. The tube was connected to gravity bag drainage and secured to the skin with 0 Prolene suture. The patient tolerated the procedure well. IMPRESSION: Successful placement of a 10 French transhepatic percutaneous cholecystostomy tube for acute cholecystitis. Electronically Signed   By: Jacqulynn Cadet M.D.   On: 09/13/2017 14:22   US Abdomen Limited Ruq  Result Date: 09/13/2017 CLINICAL DATA:  57 year old female with right upper quadrant abdominal pain x3 days. EXAM: ULTRASOUND ABDOMEN LIMITED RIGHT UPPER QUADRANT COMPARISON:  CT of the abdomen pelvis  dated 09/12/2017 FINDINGS: Gallbladder: The gallbladder is distended. There gallbladder wall is irregular with multiple outpouchings. The gallbladder wall is thickened and edematous measuring 12 mm in thickness. There is sludge within the gallbladder. There is a small amount of pericholecystic fluid. Positive sonographic Murphy's sign. There is a 7.5 x 4.8 x 6.2 cm complex area adjacent to the gallbladder which may represent an abscess or a contained perforation. Common bile duct: Diameter: 4 mm Liver: There is diffuse increased liver echogenicity most consistent with fatty infiltration. Portal vein is patent on color Doppler imaging with normal direction of blood flow towards the liver. IMPRESSION: 1. Gallbladder sludge with findings of acute cholecystitis. Complex collection adjacent to the gallbladder may represent abscess or focally contained perforation. 2. Fatty liver. 3. Patent main portal vein with hepatopetal flow. Electronically Signed   By: Anner Crete M.D.   On: 09/13/2017 00:12    Assessment & Plan:   There are no diagnoses linked to this encounter.   No orders of the defined types were placed in this encounter.    Follow-up: No follow-ups on file.  Walker Kehr, MD

## 2017-09-25 DIAGNOSIS — I130 Hypertensive heart and chronic kidney disease with heart failure and stage 1 through stage 4 chronic kidney disease, or unspecified chronic kidney disease: Secondary | ICD-10-CM | POA: Diagnosis not present

## 2017-09-25 DIAGNOSIS — J449 Chronic obstructive pulmonary disease, unspecified: Secondary | ICD-10-CM | POA: Diagnosis not present

## 2017-09-25 DIAGNOSIS — Z434 Encounter for attention to other artificial openings of digestive tract: Secondary | ICD-10-CM | POA: Diagnosis not present

## 2017-09-25 DIAGNOSIS — I5030 Unspecified diastolic (congestive) heart failure: Secondary | ICD-10-CM | POA: Diagnosis not present

## 2017-09-25 DIAGNOSIS — J9611 Chronic respiratory failure with hypoxia: Secondary | ICD-10-CM | POA: Diagnosis not present

## 2017-09-25 DIAGNOSIS — K819 Cholecystitis, unspecified: Secondary | ICD-10-CM | POA: Diagnosis not present

## 2017-09-29 ENCOUNTER — Telehealth: Payer: Self-pay | Admitting: Internal Medicine

## 2017-09-29 DIAGNOSIS — I130 Hypertensive heart and chronic kidney disease with heart failure and stage 1 through stage 4 chronic kidney disease, or unspecified chronic kidney disease: Secondary | ICD-10-CM | POA: Diagnosis not present

## 2017-09-29 DIAGNOSIS — J9611 Chronic respiratory failure with hypoxia: Secondary | ICD-10-CM | POA: Diagnosis not present

## 2017-09-29 DIAGNOSIS — Z434 Encounter for attention to other artificial openings of digestive tract: Secondary | ICD-10-CM | POA: Diagnosis not present

## 2017-09-29 DIAGNOSIS — K819 Cholecystitis, unspecified: Secondary | ICD-10-CM | POA: Diagnosis not present

## 2017-09-29 DIAGNOSIS — J449 Chronic obstructive pulmonary disease, unspecified: Secondary | ICD-10-CM | POA: Diagnosis not present

## 2017-09-29 DIAGNOSIS — I5030 Unspecified diastolic (congestive) heart failure: Secondary | ICD-10-CM | POA: Diagnosis not present

## 2017-09-29 NOTE — Telephone Encounter (Signed)
Please advise 

## 2017-09-29 NOTE — Telephone Encounter (Signed)
Unable to reach, VM full

## 2017-09-29 NOTE — Telephone Encounter (Signed)
Copied from [place] [phone]. Topic: Quick Communication - See Telephone Encounter >> Sep 29, 2017  2:22 PM Rutherford Nail, [place] wrote: CRM for notification. See Telephone encounter for: 09/29/17. Nicholette with [facility] calling to get verbal orders for home health physical therapy: 1x1 2x4 1x1 CB#: [phone]

## 2017-09-29 NOTE — Telephone Encounter (Signed)
Copied from [place] [id number]. Topic: General - Other >> Sep 29, 2017 10:50 AM Alfredia Ferguson R wrote: Pt is calling in stating she needs to meds for a yeast infection presented on the sides of her legs , in between legs, and under her breast. Pt states she was treated  before but doesn't know name of meds

## 2017-09-30 NOTE — Telephone Encounter (Signed)
Relation to pt: self  Call back [phone] Pharmacy: [facility], [place], [place] - [place] [phone] (Phone) [phone] (Fax)   Reason for call:  Patient states she was seen in the ED last week and antibotic was prescribed which is causing her to experience a yeast infection, patient requesting Rx, please advise

## 2017-09-30 NOTE — Telephone Encounter (Signed)
Verbals given, FYI 

## 2017-10-01 MED ORDER — FLUCONAZOLE 150 MG PO TABS: 150.0000 mg | ORAL_TABLET | Freq: Once | ORAL | 1 refills | Status: AC

## 2017-10-01 NOTE — Telephone Encounter (Signed)
Thanks

## 2017-10-01 NOTE — Telephone Encounter (Signed)
She needs to check w/a doctor who prescribed it Thx

## 2017-10-01 NOTE — Telephone Encounter (Signed)
OK Diflucan Thx

## 2017-10-01 NOTE — Telephone Encounter (Signed)
Pt notified and would like to know if she can start back her Humira. Please advise

## 2017-10-02 ENCOUNTER — Telehealth: Payer: Self-pay | Admitting: *Deleted

## 2017-10-02 ENCOUNTER — Encounter: Payer: Self-pay | Admitting: *Deleted

## 2017-10-02 ENCOUNTER — Other Ambulatory Visit: Payer: Self-pay | Admitting: *Deleted

## 2017-10-02 DIAGNOSIS — J9611 Chronic respiratory failure with hypoxia: Secondary | ICD-10-CM | POA: Diagnosis not present

## 2017-10-02 DIAGNOSIS — J449 Chronic obstructive pulmonary disease, unspecified: Secondary | ICD-10-CM | POA: Diagnosis not present

## 2017-10-02 DIAGNOSIS — Z434 Encounter for attention to other artificial openings of digestive tract: Secondary | ICD-10-CM | POA: Diagnosis not present

## 2017-10-02 DIAGNOSIS — K819 Cholecystitis, unspecified: Secondary | ICD-10-CM | POA: Diagnosis not present

## 2017-10-02 DIAGNOSIS — I5030 Unspecified diastolic (congestive) heart failure: Secondary | ICD-10-CM | POA: Diagnosis not present

## 2017-10-02 DIAGNOSIS — I130 Hypertensive heart and chronic kidney disease with heart failure and stage 1 through stage 4 chronic kidney disease, or unspecified chronic kidney disease: Secondary | ICD-10-CM | POA: Diagnosis not present

## 2017-10-02 NOTE — Telephone Encounter (Signed)
Patient case manager who is in the home at this time called to advise the patient is recently home from the hospital and receiving home health for Sub cutaneous drain and per the order it should be changed 1-2 days. Home health is only changing it every 3-4 days and the patient wants to know if this is ok. Patient has follow up visit Monday 10/06/17. Advised her will have to ask the provider if that is ok, as usually if we write for 1-2 days that is what it should be. Advised will ask the provider and and give the patient a call back.

## 2017-10-02 NOTE — Telephone Encounter (Signed)
I am not managing her drain.  She is scheduled to be seen back in interventional radiology drain clinic next week.  I would suggest that a check with radiology or her primary care provider, Dr. Alain [place].

## 2017-10-02 NOTE — Patient Outreach (Signed)
[name]) Care Management   10/02/2017  [name] [birth year] [id number]  [name] is an 57 y.o. female  Subjective:   Member alert and oriented x3, complains of pain at surgical site.  Expresses concern regarding frequency of home health nursing visits.  Discharge instructions state to change drain dressing every 1-2 days, nursing only visiting twice a week.  Awaiting call back from MD to provide more information about care of catheter.    Report taking all medications as prescribed, denies the need for pharmacy assistance at this time.  Follow up appointments with ID specialist next week.  Objective:   Review of Systems  Constitutional: Negative.   HENT: Negative.   Eyes: Negative.   Respiratory: Negative.   Cardiovascular: Negative.   Gastrointestinal: Negative.   Genitourinary: Negative.   Musculoskeletal: Negative.   Skin: Negative.   Neurological: Negative.   Endo/Heme/Allergies: Negative.   Psychiatric/Behavioral: Negative.     Physical Exam  Constitutional: She is oriented to person, place, and time. She appears well-developed and well-nourished.  Neck: Normal range of motion.  Cardiovascular: Normal rate, regular rhythm and normal heart sounds.  Respiratory: Effort normal and breath sounds normal.  GI: Soft. Bowel sounds are normal.  Musculoskeletal: Normal range of motion.  Neurological: She is alert and oriented to person, place, and time.  Skin: Skin is warm and dry.   BP 112/64 (BP Location: Left Arm, Patient Position: Sitting, Cuff Size: Normal)   Pulse 66   Ht 1.702 m (_0 )   Wt 257 lb (116.6 kg)   SpO2 98%   PF (!) 18 L/min   BMI 40.25 kg/m   Encounter Medications:   Outpatient Encounter Medications as of 10/02/2017  Medication Sig Note  . acetaminophen (TYLENOL) 325 MG tablet Take 2 tablets (650 mg total) by mouth every 6 (six) hours as needed for mild pain, fever or headache.   . B-D ULTRA-FINE 33  LANCETS MISC Use as instructed to test sugars 4 times daily DX: E11.8   . budesonide-formoterol (SYMBICORT) 160-4.5 MCG/ACT inhaler Inhale 2 puffs into the lungs TWICE DAILY 120/4=30   . carvedilol (COREG) 12.5 MG tablet Take 1 tablet (12.5 mg total) by mouth 2 (two) times daily with a meal. 09/12/2017: Pt stated she hasn't taken medication in over 1 week.  . clonazePAM (KLONOPIN) 0.5 MG tablet Take 1-2 tablets (0.5-1 mg total) by mouth at bedtime.   [name] esomeprazole (NEXIUM) 40 MG capsule Take 1 capsule (40 mg total) by mouth daily before breakfast.   . furosemide (LASIX) 40 MG tablet TAKE 1 TABLET BY MOUTH EVERY MORNING FOR EDEMA (Patient taking differently: Take 20 mg by mouth daily as needed for fluid or edema. )   . gabapentin (NEURONTIN) 300 MG capsule Take 1 capsule (300 mg total) by mouth 2 (two) times daily as needed (for pain).   [name] glucose blood (EZ SMART PLUS GLUCOSE TEST) test strip Use as instructed to test sugars 4 times daily DX: E11.8   . JANUVIA 50 MG tablet Take 1 tablet (50 mg total) by mouth daily. (Patient taking differently: Take 50 mg by mouth daily. )   . metFORMIN (GLUCOPHAGE-XR) 500 MG 24 hr tablet TAKE 2 TABLETS(1000 MG) BY MOUTH DAILY WITH BREAKFAST   . oxyCODONE (OXY IR/ROXICODONE) 5 MG immediate release tablet Take 1 tablet (5 mg total) by mouth every 4 (four) hours as needed for moderate pain.   . potassium chloride (K-DUR) 10 MEQ tablet Take 1 tablet (  10 mEq total) by mouth daily.   . potassium chloride (K-DUR,KLOR-CON) 10 MEQ tablet Take 10 mEq by mouth daily.    [name] topiramate (TOPAMAX) 50 MG tablet Take 1 tablet (50 mg total) by mouth 2 (two) times daily.   [name] venlafaxine XR (EFFEXOR-XR) 75 MG 24 hr capsule Take 1 capsule (75 mg total) by mouth daily with breakfast.   . VENTOLIN HFA 108 (90 Base) MCG/ACT inhaler Inhale 2 puffs into the lungs every 6 (six) hours as needed for wheezing or shortness of breath. 200/8=25 (Patient taking differently: Inhale 2 puffs into the  lungs every 6 (six) hours as needed for wheezing or shortness of breath. )   . VOLTAREN 1 % GEL Apply 2 g topically 2 (two) times daily as needed (for pain). 100/4=25 (Patient taking differently: Apply 4 g topically 4 (four) times daily as needed (pain). )   . XARELTO 20 MG TABS tablet Take 1 tablet (20 mg total) by mouth at bedtime.   . Adalimumab (HUMIRA) 40 MG/0.8ML PSKT Inject 40 mg into the skin every 7 (seven) days.  09/12/2017: Hasn't taken in 3 weeks.  . polyethylene glycol (MIRALAX / GLYCOLAX) packet Take 17 g by mouth daily. (Patient not taking: Reported on 10/02/2017)   . promethazine (PHENERGAN) 25 MG tablet Take 1 tablet (25 mg total) by mouth every 8 (eight) hours as needed for nausea or vomiting. (Patient not taking: Reported on 10/02/2017)   . sulfamethoxazole-trimethoprim (BACTRIM,SEPTRA) 400-80 MG tablet Take 1 tablet by mouth every 12 (twelve) hours. (Patient not taking: Reported on 10/02/2017)    No facility-administered encounter medications on file as of 10/02/2017.     Functional Status:   In your present state of health, do you have any difficulty performing the following activities: 10/02/2017 09/12/2017  Hearing? N N  Vision? Y Y  Comment - surgery on right tear duct in June2019  Difficulty concentrating or making decisions? N N  Walking or climbing stairs? Y Y  Dressing or bathing? N N  Doing errands, shopping? [name]  Preparing Food and eating ? N -  Using the Toilet? N -  In the past six months, have you accidently leaked urine? Y -  Do you have problems with loss of bowel control? N -  Managing your Medications? N -  Managing your Finances? N -  Housekeeping or managing your Housekeeping? Y -  Some recent data might be hidden    Fall/Depression Screening:    Fall Risk  10/02/2017 02/17/2017 01/15/2017  Falls in the past year? No Yes Yes  Number falls in past yr: - 2 or more 2 or more  Injury with Fall? - Yes Yes  Risk Factor Category  - High Fall Risk High Fall Risk   Risk for fall due to : - History of fall(s);Medication side effect;Impaired balance/gait;Impaired mobility History of fall(s);Impaired balance/gait;Impaired mobility  Follow up - Falls evaluation completed;Education provided;Falls prevention discussed Education provided;Falls prevention discussed   PHQ 2/9 Scores 10/02/2017 02/17/2017 01/15/2017 12/19/2016 11/06/2016 10/24/2016 07/08/2016  PHQ - 2 Score 0 _0 0 1  PHQ- 9 Score - 7 - 9 7 - -    Assessment:    Met with member at scheduled time, consent on chart, no change.  Assessment done, no acute distress noted.  She is increasing strength with PT/OT, has PCS aide daily.  Drain assessed, no drainage noted on dressing, sutures intact, bag drainage green.  Denies any symptoms of infection.  Denies any urgent concerns, provided with contact information for this care manager.    Plan:   Will follow up with member within the next 2 weeks.  [facility] CM Care Plan Problem One     Most Recent Value  Care Plan Problem One  Risk for admision related to cholecystitic as evidenced by recent hospitalization  Role Documenting the Problem One  Care Management [place] for Problem One  Active  [facility] Long Term Goal   Member wil not have any readmissions within the next 31 days.  [facility] Long Term Goal Start Date  09/22/17  Interventions for Problem One Long Term Goal  Reviewed 24 hour nurse triage line with member.  Advised to contact with concerns  [facility] CM Short Term Goal #1   Member will report flushing drain 3 times a day as insructed over the next 4 weeks  [facility] CM Short Term Goal #1 Start Date  09/22/17  Interventions for Short Term Goal #1  Call placed to MD office to follow up on instuctions for drain care.  THN CM Short Term Goal #2   Member will keep and attend follow up appointments (primary, ID, radiology and GI) within the next 3 weeks  [facility] CM Short Term Goal #2 Start Date  09/22/17  Interventions for Short Term Goal #2  Member educated on  importance of attending follow up appointment.  Transportation discussed, plan confirmed.     Valente David, [place], MSN [place] [phone]

## 2017-10-02 NOTE — Telephone Encounter (Signed)
Pt.notified

## 2017-10-03 DIAGNOSIS — J9611 Chronic respiratory failure with hypoxia: Secondary | ICD-10-CM | POA: Diagnosis not present

## 2017-10-03 DIAGNOSIS — I5030 Unspecified diastolic (congestive) heart failure: Secondary | ICD-10-CM | POA: Diagnosis not present

## 2017-10-03 DIAGNOSIS — K819 Cholecystitis, unspecified: Secondary | ICD-10-CM | POA: Diagnosis not present

## 2017-10-03 DIAGNOSIS — Z434 Encounter for attention to other artificial openings of digestive tract: Secondary | ICD-10-CM | POA: Diagnosis not present

## 2017-10-03 DIAGNOSIS — I130 Hypertensive heart and chronic kidney disease with heart failure and stage 1 through stage 4 chronic kidney disease, or unspecified chronic kidney disease: Secondary | ICD-10-CM | POA: Diagnosis not present

## 2017-10-03 DIAGNOSIS — J449 Chronic obstructive pulmonary disease, unspecified: Secondary | ICD-10-CM | POA: Diagnosis not present

## 2017-10-06 ENCOUNTER — Ambulatory Visit: Payer: Medicare Other | Admitting: Endocrinology

## 2017-10-06 ENCOUNTER — Ambulatory Visit (INDEPENDENT_AMBULATORY_CARE_PROVIDER_SITE_OTHER): Payer: Medicare Other | Admitting: Internal Medicine

## 2017-10-06 ENCOUNTER — Encounter: Payer: Self-pay | Admitting: Internal Medicine

## 2017-10-06 DIAGNOSIS — K810 Acute cholecystitis: Secondary | ICD-10-CM

## 2017-10-06 NOTE — Progress Notes (Signed)
[facility]  Patient Active Problem List   Diagnosis Date Noted  . Acute cholecystitis 09/12/2017    Priority: High  . Sepsis ([place]) 09/12/2017    Priority: High  . Anemia 09/24/2017  . Migraines 09/17/2017  . COPD (chronic obstructive pulmonary disease) ([place]) 12/30/2016  . Diastolic dysfunction 45/03/8880  . OSA (obstructive sleep apnea) 09/30/2016  . Pulmonary embolism ([place]) 07/21/2016  . DVT (deep venous thrombosis) ([place]) 07/21/2016  . Weight gain 12/20/2015  . Controlled type 2 diabetes mellitus with chronic kidney disease, without long-term current use of insulin ([place]) 02/01/2015  . Abnormal SPEP 06/04/2013  . Memory loss 04/27/2013  . Panic attacks 03/17/2013  . Pulmonary nodules 07/20/2012  . CAD in native artery 05/28/2012  . Seborrheic dermatitis 01/20/2012  . Obesity 03/07/2010  . INSOMNIA, CHRONIC 03/07/2010  . Allergic rhinitis 10/26/2009  . Essential hypertension 08/27/2007  . B12 deficiency 08/25/2007  . Vitamin D deficiency 08/25/2007  . Osteoarthritis 02/02/2007  . Sarcoidosis 08/14/2006  . Anxiety state 08/14/2006  . Major depressive disorder, recurrent episode ([place]) 08/14/2006  . GERD 08/14/2006    Patient's Medications  New Prescriptions   No medications on file  Previous Medications   ACETAMINOPHEN (TYLENOL) 325 MG TABLET    Take 2 tablets (650 mg total) by mouth every 6 (six) hours as needed for mild pain, fever or headache.   ADALIMUMAB (HUMIRA) 40 MG/0.8ML PSKT    Inject 40 mg into the skin every 7 (seven) days.    B-D ULTRA-FINE 33 LANCETS MISC    Use as instructed to test sugars 4 times daily DX: E11.8   BUDESONIDE-FORMOTEROL (SYMBICORT) 160-4.5 MCG/ACT INHALER    Inhale 2 puffs into the lungs TWICE DAILY 120/4=30   CARVEDILOL (COREG) 12.5 MG TABLET    Take 1 tablet (12.5 mg total) by mouth 2 (two) times daily with a meal.   CLONAZEPAM (KLONOPIN) 0.5 MG TABLET    Take 1-2 tablets (0.5-1 mg total) by mouth at  bedtime.   ESOMEPRAZOLE (NEXIUM) 40 MG CAPSULE    Take 1 capsule (40 mg total) by mouth daily before breakfast.   FUROSEMIDE (LASIX) 40 MG TABLET    TAKE 1 TABLET BY MOUTH EVERY MORNING FOR EDEMA   GABAPENTIN (NEURONTIN) 300 MG CAPSULE    Take 1 capsule (300 mg total) by mouth 2 (two) times daily as needed (for pain).   GLUCOSE BLOOD (EZ SMART PLUS GLUCOSE TEST) TEST STRIP    Use as instructed to test sugars 4 times daily DX: E11.8   JANUVIA 50 MG TABLET    Take 1 tablet (50 mg total) by mouth daily.   METFORMIN (GLUCOPHAGE-XR) 500 MG 24 HR TABLET    TAKE 2 TABLETS(1000 MG) BY MOUTH DAILY WITH BREAKFAST   OXYCODONE (OXY IR/ROXICODONE) 5 MG IMMEDIATE RELEASE TABLET    Take 1 tablet (5 mg total) by mouth every 4 (four) hours as needed for moderate pain.   POLYETHYLENE GLYCOL (MIRALAX / GLYCOLAX) PACKET    Take 17 g by mouth daily.   POTASSIUM CHLORIDE (K-DUR) 10 MEQ TABLET    Take 1 tablet (10 mEq total) by mouth daily.   POTASSIUM CHLORIDE (K-DUR,KLOR-CON) 10 MEQ TABLET    Take 10 mEq by mouth daily.    PROMETHAZINE (PHENERGAN) 25 MG TABLET    Take 1 tablet (25 mg total) by mouth every 8 (eight) hours as needed for nausea or vomiting.   TOPIRAMATE (TOPAMAX) 50 MG  TABLET    Take 1 tablet (50 mg total) by mouth 2 (two) times daily.   VENLAFAXINE XR (EFFEXOR-XR) 75 MG 24 HR CAPSULE    Take 1 capsule (75 mg total) by mouth daily with breakfast.   VENTOLIN HFA 108 (90 BASE) MCG/ACT INHALER    Inhale 2 puffs into the lungs every 6 (six) hours as needed for wheezing or shortness of breath. 200/8=25   VOLTAREN 1 % GEL    Apply 2 g topically 2 (two) times daily as needed (for pain). 100/4=25   XARELTO 20 MG TABS TABLET    Take 1 tablet (20 mg total) by mouth at bedtime.  Modified Medications   No medications on file  Discontinued Medications   SULFAMETHOXAZOLE-TRIMETHOPRIM (BACTRIM,SEPTRA) 400-80 MG TABLET    Take 1 tablet by mouth every 12 (twelve) hours.    Subjective: [name] is in for her  hospital follow-up visit. She  is a 57 y.o. 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  She was admitted in late August with abdominal pain, nausea, vomiting and diarrhea.  She had a low-grade temperature of 100.5 degrees and leukocytosis upon admission.  CT scan revealed a very distended and thickened gallbladder consistent with acute cholecystitis.  There were also several small areas of low-attenuation in the adjacent liver and gallbladder fossa with peripheral enhancement compatible with small multifocal abscesses.  She was felt to be too high risk for surgery and underwent percutaneous cholecystostomy on 09/13/2017.  "Foul-smelling, milky bile" was obtained.  Gram stain showed gram-positive cocci in clusters and cultures have grown staph aureus.  Oxacillin susceptibilities could not be obtained before discharge but the organism was susceptible to both tetracycline and trimethoprim sulfamethoxazole.  She defervesced and her white count and liver enzymes returned to normal.    She was discharged on oral trimethoprim sulfamethoxazole and completed a total of 3 weeks of antibiotic therapy on 10/02/2017.  She remains fatigued but is feeling much better.  She is having about 100 cc of fluid out of her drain tube daily.  Her abdominal pain has resolved except for some discomfort around the tube.  She is scheduled to be seen in interventional radiology drain clinic in 2 days.  She has spoken with her dermatologist and he told her that it is okay to go ahead and restart Humira but she wanted to talk with me first.  She does feel like her cutaneous sarcoid may have been starting to flare up.  Review of Systems: Review of Systems  Constitutional: Positive for malaise/fatigue. Negative for chills, diaphoresis and fever.  Gastrointestinal: Negative for abdominal pain, diarrhea, nausea and  vomiting.    Past Medical History:  Diagnosis Date  . Allergic rhinitis   . ALLERGIC RHINITIS 10/26/2009  . Anxiety   . ANXIETY 08/14/2006  . B12 DEFICIENCY 08/25/2007  . Complication of anesthesia    pt has had difficulty following anesthesia with her knee in 2016-unable to care for herself afterward  . Confusion   . Depression    takes Lexapro daily  . Diabetes mellitus without complication ([place])    was on insulin but has been off since Nov 2015 and now only takes Metformin daily  . DYSPNEA 04/28/2009   with exertion  . Esophageal reflux    takes Nexium daily  . Fibromyalgia   . Headache    last migraine 2-42yrs ago;takes Topamax daily  .  History of shingles   . Hypertension    takes Coreg daily  . Insomnia    takes Nortriptyline nightly   . Joint pain   . Joint swelling   . Left knee pain   . Lichen planus   . Long-term memory loss   . Nocturia   . OSA (obstructive sleep apnea)    doesn't use CPAP;sleep study in epic from 2006  . Osteoarthritis   . Osteoarthritis   . Pneumonia    over 30 yrs ago  . Protein calorie malnutrition ([place])   . Rheumatoid arthritis ([place])   . Sarcoidosis    Dr. Lolita Patella  . Short-term memory loss   . Shortness of breath   . TIA (transient ischemic attack)   . Unsteady gait   . Urinary urgency   . Vitamin D deficiency    is supposed to take Vit D but can't afford it  . VITAMIN D DEFICIENCY 08/25/2007    Social History   Tobacco Use  . Smoking status: Former Smoker    Packs/day: 0.50    Years: 10.00    Pack years: 5.00    Types: Cigarettes  . Smokeless tobacco: Never Used  . Tobacco comment: quit smoking in 2004  Substance Use Topics  . Alcohol use: No  . Drug use: No    Family History  Problem Relation Age of Onset  . Heart disease Mother   . Kidney disease Mother   . Cancer Father        leukemia  . Hypertension Unknown   . Coronary artery disease Unknown        female 1st degree relative <60  . Heart failure Unknown         congestive  . Coronary artery disease Unknown        Female 1st degree relative <50  . Breast cancer Unknown        1st degree relative <50 S  . Breast cancer Sister     Allergies  Allergen Reactions  . Belsomra [Suvorexant] Other (See Comments)    [place] out of bed while asleep: "I'm waking up as I'm falling on the floor;" "Night terrors"  . Enalapril Maleate Cough  . Latex Hives  . Nickel Other (See Comments)    Blisters  Pt has a titanium right and left knee - nickel causes skin irritations that form into blisters and sores  . Other Other (See Comments)    Patient has Sarcoidosis and can't tolerate any metals  . Iodine Other (See Comments)    Patient received IV contrast without pre-meds without any problems 09/12/17 kwl Mother was intolerant--CODED on CT table---pt never tried  . Doxycycline Diarrhea and Nausea And Vomiting  . Hydrochlorothiazide Other (See Comments)    Low potassium levels   . Hydroxychloroquine Sulfate Other (See Comments)    Vision changes   . Lyrica [Pregabalin] Other (See Comments)    "Made me feel high"  . Tape Other (See Comments)    Medical tape causes bruising    Objective: Vitals:   10/06/17 1036  BP: 119/73  Pulse: 73  Resp: (!) 22  Temp: 97.8 F (36.6 C)  TempSrc: Oral  SpO2: 95%  Weight: 257 lb 8 oz (116.8 kg)  Height: 5\' 7"  (1.702 m)   Body mass index is 40.33 kg/m.  Physical Exam  Constitutional: She is oriented to person, place, and time.  She is looking much better than when I last saw her in the hospital.  Abdominal: Soft. She exhibits no distension. There is no tenderness.  She has a little bit of crusting around the right upper quadrant drain exit site.  Neurological: She is alert and oriented to person, place, and time.  Skin: No rash noted.  Psychiatric: She has a normal mood and affect.    Lab Results    Problem List Items Addressed This Visit      High   Acute cholecystitis    Acute cholecystitis has  resolved with percutaneous drainage and I am quite hopeful that her tiny, adjacent abscesses have now been cured following 3 weeks of antibiotic therapy.  I told her it is okay with me to go ahead and restart Humira.  She can follow-up here as needed.          Angela Bickers, MD [facility] [phone] pager   [phone] cell 10/06/2017, 11:15 AM

## 2017-10-06 NOTE — Telephone Encounter (Signed)
Patient aware.

## 2017-10-06 NOTE — Assessment & Plan Note (Signed)
Acute cholecystitis has resolved with percutaneous drainage and I am quite hopeful that her tiny, adjacent abscesses have now been cured following 3 weeks of antibiotic therapy.  I told her it is okay with me to go ahead and restart Humira.  She can follow-up here as needed.

## 2017-10-07 DIAGNOSIS — I130 Hypertensive heart and chronic kidney disease with heart failure and stage 1 through stage 4 chronic kidney disease, or unspecified chronic kidney disease: Secondary | ICD-10-CM | POA: Diagnosis not present

## 2017-10-07 DIAGNOSIS — Z434 Encounter for attention to other artificial openings of digestive tract: Secondary | ICD-10-CM | POA: Diagnosis not present

## 2017-10-07 DIAGNOSIS — K819 Cholecystitis, unspecified: Secondary | ICD-10-CM | POA: Diagnosis not present

## 2017-10-07 DIAGNOSIS — J9611 Chronic respiratory failure with hypoxia: Secondary | ICD-10-CM | POA: Diagnosis not present

## 2017-10-07 DIAGNOSIS — J449 Chronic obstructive pulmonary disease, unspecified: Secondary | ICD-10-CM | POA: Diagnosis not present

## 2017-10-07 DIAGNOSIS — I5030 Unspecified diastolic (congestive) heart failure: Secondary | ICD-10-CM | POA: Diagnosis not present

## 2017-10-08 ENCOUNTER — Other Ambulatory Visit: Payer: Self-pay | Admitting: Surgery

## 2017-10-08 ENCOUNTER — Ambulatory Visit
Admission: RE | Admit: 2017-10-08 | Discharge: 2017-10-08 | Disposition: A | Discharge status: Home / Self Care | Payer: Medicare Other | Source: Ambulatory Visit | Attending: Surgery | Admitting: Surgery

## 2017-10-08 ENCOUNTER — Encounter: Payer: Self-pay | Admitting: Radiology

## 2017-10-08 ENCOUNTER — Ambulatory Visit
Admission: RE | Admit: 2017-10-08 | Discharge: 2017-10-08 | Disposition: A | Discharge status: Home / Self Care | Payer: Medicare Other | Source: Ambulatory Visit | Attending: Radiology | Admitting: Radiology

## 2017-10-08 DIAGNOSIS — K819 Cholecystitis, unspecified: Secondary | ICD-10-CM

## 2017-10-08 HISTORY — PX: IR RADIOLOGIST EVAL & MGMT: IMG5224

## 2017-10-08 NOTE — Progress Notes (Signed)
Chief Complaint: Follow-up cholecystostomy placed 8/31 by Dr. Laurence Ferrari d/t acalculus cholecystitis  Referring Physician(s): Dr. Leighton Ruff  Supervising Physician: Jacqulynn Cadet  History of Present Illness: [name] is a 57 y.o. female with PMH of CAD, HTN, DM, DVT/PE on Xarelto, OSA, COPD, sarcoidosis who presented to ED on 8/30 with complaints of progressive RUQ pain x 2 weeks. [place] at the time showed sludge and wall thickening consistent with cholecystitis. She was started on IV antibiotics and evaluated by general surgery who felt that patient was too high risk for surgery at that time - IR was consulted for percutaneous cholecystostomy placement which was performed on 8/31 by Dr. Laurence Ferrari.   Patient presents today for first follow-up appointment in IR clinic after discharge from hospital. She denies any issues with her drain today, she has not been symptomatic since she has returned home. She was seen by infectious disease on 10/06/17 who recommends follow-up as needed. She was also seen by her PCP on 09/24/17 who recommends continued surveillance. General surgery signed off on 9/4 while patient was in the hospital and per d/c summary she has PRN follow-up with general surgery, she states she is not aware of any appointment with their service at this time.   Past Medical History:  Diagnosis Date  . Allergic rhinitis   . ALLERGIC RHINITIS 10/26/2009  . Anxiety   . ANXIETY 08/14/2006  . B12 DEFICIENCY 08/25/2007  . Complication of anesthesia    pt has had difficulty following anesthesia with her knee in 2016-unable to care for herself afterward  . Confusion   . Depression    takes Lexapro daily  . Diabetes mellitus without complication ([place])    was on insulin but has been off since Nov 2015 and now only takes Metformin daily  . DYSPNEA 04/28/2009   with exertion  . Esophageal reflux    takes Nexium daily  . Fibromyalgia   . Headache    last migraine  2-4yrs ago;takes Topamax daily  . History of shingles   . Hypertension    takes Coreg daily  . Insomnia    takes Nortriptyline nightly   . Joint pain   . Joint swelling   . Left knee pain   . Lichen planus   . Long-term memory loss   . Nocturia   . OSA (obstructive sleep apnea)    doesn't use CPAP;sleep study in epic from 2006  . Osteoarthritis   . Osteoarthritis   . Pneumonia    over 30 yrs ago  . Protein calorie malnutrition ([place])   . Rheumatoid arthritis ([place])   . Sarcoidosis    Dr. Lolita Patella  . Short-term memory loss   . Shortness of breath   . TIA (transient ischemic attack)   . Unsteady gait   . Urinary urgency   . Vitamin D deficiency    is supposed to take Vit D but can't afford it  . VITAMIN D DEFICIENCY 08/25/2007    Past Surgical History:  Procedure Laterality Date  . APPENDECTOMY    . arthroscopic knee surgery Right 11-12-04  . AXILLARY ABCESS IRRIGATION AND DEBRIDEMENT  Jul & Aug2012  . CARPAL TUNNEL RELEASE Left 05/23/2014   Procedure: CARPAL TUNNEL RELEASE;  Surgeon: Meredith Pel, MD;  Location: [facility];  Service: Orthopedics;  Laterality: Left;  . cyst removed from top of buttocks  at age 34  . ENDOMETRIAL ABLATION    . IR PERC CHOLECYSTOSTOMY  09/13/2017  . IR RADIOLOGIST  EVAL & MGMT  10/08/2017  . LACRIMAL DUCT EXPLORATION Right 06/26/2017   Procedure: LACRIMAL DUCT EXPLORATION AND ETHMOIDECTOMY;  Surgeon: Clista Bernhardt, MD;  Location: [facility];  Service: Ophthalmology;  Laterality: Right;  . TEAR DUCT PROBING Right 06/26/2017   Procedure: TEAR DUCT PROBING WITH STENT;  Surgeon: Clista Bernhardt, MD;  Location: [facility];  Service: Ophthalmology;  Laterality: Right;  . TOTAL KNEE ARTHROPLASTY Right 11/15/2014   Procedure: TOTAL RIGHT KNEE ARTHROPLASTY;  Surgeon: Meredith Pel, MD;  Location: [facility];  Service: Orthopedics;  Laterality: Right;  . TOTAL KNEE ARTHROPLASTY Left 07/13/2015   Procedure: LEFT TOTAL KNEE ARTHROPLASTY;  Surgeon: Meredith Pel, MD;  Location: [facility];  Service: Orthopedics;  Laterality: Left;    Allergies: Belsomra [suvorexant]; Enalapril maleate; Latex; Nickel; Other; Iodine; Doxycycline; Hydrochlorothiazide; Hydroxychloroquine sulfate; Lyrica [pregabalin]; and Tape  Medications: Prior to Admission medications   Medication Sig Start Date End Date Taking? Authorizing Provider  acetaminophen (TYLENOL) 325 MG tablet Take 2 tablets (650 mg total) by mouth every 6 (six) hours as needed for mild pain, fever or headache. 09/19/17   Debbe Odea, MD  Adalimumab (HUMIRA) 40 MG/0.8ML PSKT Inject 40 mg into the skin every 7 (seven) days.     [provider]  B-D ULTRA-FINE 33 LANCETS MISC Use as instructed to test sugars 4 times daily DX: E11.8 09/22/17   Philemon Kingdom, MD  budesonide-formoterol [facility]) 160-4.5 MCG/ACT inhaler Inhale 2 puffs into the lungs TWICE DAILY 120/4=30 06/10/17   Rigoberto Noel, MD  carvedilol (COREG) 12.5 MG tablet Take 1 tablet (12.5 mg total) by mouth 2 (two) times daily with a meal. 06/12/17   Plotnikov, Evie Lacks, MD  clonazePAM (KLONOPIN) 0.5 MG tablet Take 1-2 tablets (0.5-1 mg total) by mouth at bedtime. 08/04/17   Plotnikov, Evie Lacks, MD  esomeprazole (NEXIUM) 40 MG capsule Take 1 capsule (40 mg total) by mouth daily before breakfast. 08/07/17   Plotnikov, Evie Lacks, MD  furosemide (LASIX) 40 MG tablet TAKE 1 TABLET BY MOUTH EVERY MORNING FOR EDEMA Patient taking differently: Take 20 mg by mouth daily as needed for fluid or edema.  01/10/17   Plotnikov, Evie Lacks, MD  gabapentin (NEURONTIN) 300 MG capsule Take 1 capsule (300 mg total) by mouth 2 (two) times daily as needed (for pain). 06/12/17   Plotnikov, Evie Lacks, MD  glucose blood (EZ SMART PLUS GLUCOSE TEST) test strip Use as instructed to test sugars 4 times daily DX: E11.8 09/22/17   Philemon Kingdom, MD  JANUVIA 50 MG tablet Take 1 tablet (50 mg total) by mouth daily. Patient taking differently: Take 50 mg by mouth daily.   09/10/17   Philemon Kingdom, MD  metFORMIN (GLUCOPHAGE-XR) 500 MG 24 hr tablet TAKE 2 TABLETS(1000 MG) BY MOUTH DAILY WITH BREAKFAST 06/12/17   Plotnikov, Evie Lacks, MD  oxyCODONE (OXY IR/ROXICODONE) 5 MG immediate release tablet Take 1 tablet (5 mg total) by mouth every 4 (four) hours as needed for moderate pain. 09/19/17   Debbe Odea, MD  polyethylene glycol (MIRALAX / GLYCOLAX) packet Take 17 g by mouth daily. 09/19/17   Debbe Odea, MD  potassium chloride (K-DUR) 10 MEQ tablet Take 1 tablet (10 mEq total) by mouth daily. 09/24/17   Plotnikov, Evie Lacks, MD  potassium chloride (K-DUR,KLOR-CON) 10 MEQ tablet Take 10 mEq by mouth daily.  09/08/17   [provider]  promethazine (PHENERGAN) 25 MG tablet Take 1 tablet (25 mg total) by mouth every 8 (eight)  hours as needed for nausea or vomiting. Patient not taking: Reported on 10/06/2017 09/03/17   Plotnikov, Evie Lacks, MD  topiramate (TOPAMAX) 50 MG tablet Take 1 tablet (50 mg total) by mouth 2 (two) times daily. 06/12/17   Plotnikov, Evie Lacks, MD  venlafaxine XR (EFFEXOR-XR) 75 MG 24 hr capsule Take 1 capsule (75 mg total) by mouth daily with breakfast. 03/04/17   Plotnikov, Evie Lacks, MD  VENTOLIN HFA 108 (90 Base) MCG/ACT inhaler Inhale 2 puffs into the lungs every 6 (six) hours as needed for wheezing or shortness of breath. 200/8=25 Patient taking differently: Inhale 2 puffs into the lungs every 6 (six) hours as needed for wheezing or shortness of breath.  12/11/16   Plotnikov, Evie Lacks, MD  VOLTAREN 1 % GEL Apply 2 g topically 2 (two) times daily as needed (for pain). 100/4=25 Patient taking differently: Apply 4 g topically 4 (four) times daily as needed (pain).  04/29/17   Plotnikov, Evie Lacks, MD  XARELTO 20 MG TABS tablet Take 1 tablet (20 mg total) by mouth at bedtime. 06/12/17   Plotnikov, Evie Lacks, MD     Family History  Problem Relation Age of Onset  . Heart disease Mother   . Kidney disease Mother   . Cancer Father         leukemia  . Hypertension Unknown   . Coronary artery disease Unknown        female 1st degree relative <60  . Heart failure Unknown        congestive  . Coronary artery disease Unknown        Female 1st degree relative <50  . Breast cancer Unknown        1st degree relative <50 S  . Breast cancer Sister     Social History   Socioeconomic History  . Marital status: Single    Spouse name: Not on file  . Number of children: Not on file  . Years of education: Not on file  . Highest education level: Not on file  Occupational History  . Occupation: disabled  Social Needs  . Financial resource strain: Not on file  . Food insecurity:    Worry: Not on file    Inability: Not on file  . Transportation needs:    Medical: Not on file    Non-medical: Not on file  Tobacco Use  . Smoking status: Former Smoker    Packs/day: 0.50    Years: 10.00    Pack years: 5.00    Types: Cigarettes  . Smokeless tobacco: Never Used  . Tobacco comment: quit smoking in 2004  Substance and Sexual Activity  . Alcohol use: No  . Drug use: No  . Sexual activity: Not Currently  Lifestyle  . Physical activity:    Days per week: Not on file    Minutes per session: Not on file  . Stress: Not on file  Relationships  . Social connections:    Talks on phone: Not on file    Gets together: Not on file    Attends religious service: Not on file    Active member of club or organization: Not on file    Attends meetings of clubs or organizations: Not on file    Relationship status: Not on file  Other Topics Concern  . Not on file  Social History Narrative   Single, broke up with partner in 2008.    Review of Systems: A 12 point ROS discussed and pertinent positives  are indicated in the HPI above.  All other systems are negative.  Review of Systems  Constitutional: Negative for chills and fever.  Respiratory: Negative for shortness of breath.   Cardiovascular: Negative for chest pain.  Gastrointestinal:  Negative for abdominal pain, diarrhea, nausea and vomiting.    Vital Signs: BP 135/73   Pulse 71   Temp 97.7 F (36.5 C)   SpO2 100%   Physical Exam  Constitutional: She appears well-developed and well-nourished.  HENT:  Head: Normocephalic.  Pulmonary/Chest: Effort normal.  Abdominal: Soft. She exhibits no distension. There is no tenderness.  Neurological: She is alert.  Skin: Skin is warm and dry.  Psychiatric: She has a normal mood and affect. Her behavior is normal.  Vitals reviewed.   Imaging: Dg Chest 2 View  Result Date: 09/12/2017 CLINICAL DATA:  Productive cough and chills EXAM: CHEST - 2 VIEW COMPARISON:  06/02/2017 FINDINGS: Cardiac enlargement with mild vascular congestion. Negative for edema or effusion. Negative for pneumonia IMPRESSION: Cardiac enlargement and mild vascular congestion. Negative for pneumonia or edema. Electronically Signed   By: Franchot Gallo M.D.   On: 09/12/2017 18:01   Ct Abdomen Pelvis W Contrast  Result Date: 09/12/2017 CLINICAL DATA:  Acute generalized abdominal pain x2 weeks with diarrhea and weight loss. EXAM: CT ABDOMEN AND PELVIS WITH CONTRAST TECHNIQUE: Multidetector CT imaging of the abdomen and pelvis was performed using the standard protocol following bolus administration of intravenous contrast. CONTRAST:  132mL ISOVUE-300 IOPAMIDOL (ISOVUE-300) INJECTION 61% COMPARISON:  None. FINDINGS: Lower chest: Heart size is normal. Minimal atelectatic subpleural nodular density in the left lower lobe on axial series 6/11 most likely represents a small focus of atelectasis given its more band like appearance on coronal reformats. No effusion or pulmonary consolidation. Hepatobiliary: Distended gallbladder with transmural thickening and pericholecystic fluid consistent with acute cholecystitis is identified. Complicating this finding are areas of low attenuation involving the adjacent liver and gallbladder fossa with mild peripheral enhancement  concerning for adjacent multifocal intrahepatic abscesses. A gallstone is identified. The common duct is normal. Otherwise, no enhancing mass of the liver is seen. Pancreas: No pancreatic ductal dilatation, enhancing mass or inflammation. Spleen: Normal size spleen. Adrenals/Urinary Tract: Normal bilateral adrenal glands. Symmetric enhancement of the kidneys with small cyst arising off the interpolar right kidney measuring 13 mm. No hydroureteronephrosis. The urinary bladder is unremarkable. Stomach/Bowel: Small hiatal hernia. Decompressed stomach with normal small bowel rotation. No small-bowel obstruction. The distal and terminal ileum are normal. Patient is status post appendectomy. Sympathetic thickening of the colon traversing the right upper quadrant and mid upper abdomen is identified secondary to the inflamed gallbladder. Otherwise, there is scattered colonic diverticulosis along the descending colon with greater degree of sigmoid diverticulosis and circular muscle hypertrophy identified. Vascular/Lymphatic: Aortoiliac atherosclerosis. No pathologically enlarged lymph nodes. Small subcentimeter short axis periportal lymph nodes likely reactive in etiology are noted. Reproductive: The uterus and adnexa are unremarkable. Other: No free air free fluid. Musculoskeletal: No acute nor aggressive osseous abnormality. Degenerative changes are present along the dorsal spine. IMPRESSION: Acute cholecystitis with markedly distended gallbladder, transmural thickening and pericholecystic inflammation identified. Complicating this finding are adjacent intrahepatic and pericholecystic fluid attenuating hypodensities with slight peripheral enhancement compatible with adjacent abscesses some which appear to be intrahepatic subjacent to the gallbladder fossa. 13 mm cyst arising from the interpolar right without worrisome features. Colonic diverticulosis without acute diverticulitis. Electronically Signed   By: Ashley Royalty  M.D.   On: 09/12/2017 17:23   Ir Perc  Cholecystostomy  Result Date: 09/13/2017 INDICATION: 57 year old female with acute cholecystitis. She is a poor operative candidate and presents for percutaneous cholecystostomy tube placement. EXAM: CHOLECYSTOSTOMY MEDICATIONS: Patient is on intravenous antibiotics as an inpatient. No additional prophylaxis administered. ANESTHESIA/SEDATION: Moderate (conscious) sedation was employed during this procedure. A total of Versed 2 mg and Fentanyl 50 mcg was administered intravenously. Moderate Sedation Time: 12 minutes. The patient's level of consciousness and vital signs were monitored continuously by radiology nursing throughout the procedure under my direct supervision. FLUOROSCOPY TIME:  Fluoroscopy Time: 0 minutes 53 seconds (54 mGy). COMPLICATIONS: None immediate. PROCEDURE: Informed written consent was obtained from the patient after a thorough discussion of the procedural risks, benefits and alternatives. All questions were addressed. Maximal Sterile Barrier Technique was utilized including caps, mask, sterile gowns, sterile gloves, sterile drape, hand hygiene and skin antiseptic. A timeout was performed prior to the initiation of the procedure. Ultrasound was used to interrogate the right upper quadrant. The grossly inflamed and irregular gallbladder is successfully identified. A suitable skin entry site was selected and local anesthesia attained by infiltration with 1% lidocaine. A small dermatotomy was made. Under real-time sonographic guidance, a 21 gauge Accustick needle was advanced through the skin, through a short transhepatic course and into the lumen of the gallbladder. The 0.018 wire was then coiled in the distended gallbladder lumen. The Accustick needle was exchanged for the Accustick sheath. A gentle hand injection of contrast material was performed opacifying the gallbladder lumen. A 75 cm superstiff Amplatz wire was then advanced and coiled in the  gallbladder lumen. The Accustick sheath was removed. The soft tissue tract was dilated to 10 [place] and a Cook 10.2 [place] all-purpose drainage catheter was advanced over the wire and formed in the gallbladder. There was return of foul-smelling milky bile. A sample was collected and sent for culture. A final gentle hand injection of contrast material was performed and a fluoroscopic spot image was saved to document the position of the drainage catheter. The tube was connected to gravity bag drainage and secured to the skin with 0 Prolene suture. The patient tolerated the procedure well. IMPRESSION: Successful placement of a 10 French transhepatic percutaneous cholecystostomy tube for acute cholecystitis. Electronically Signed   By: Jacqulynn Cadet M.D.   On: 09/13/2017 14:22   Dg Abd Portable 2v  Result Date: 09/15/2017 CLINICAL DATA:  57 year old female with increasing generalized abdominal pain EXAM: PORTABLE ABDOMEN - 2 VIEW COMPARISON:  CT abdomen/pelvis 09/12/2017 FINDINGS: No evidence of free air on the lateral decubitus radiographs. The previously placed percutaneous cholecystostomy tube remains in unchanged position overlying the expected location of the gallbladder. The bowel gas pattern is not obstructed. No abnormal calcifications. No acute osseous abnormality. IMPRESSION: 1. Well-positioned percutaneous cholecystostomy tube. 2. No evidence of free air or bowel obstruction. Electronically Signed   By: Jacqulynn Cadet M.D.   On: 09/15/2017 09:25   Ir Radiologist Eval & Mgmt  Result Date: 10/08/2017 Please refer to notes tab for details about interventional procedure. (Op Note)  US Abdomen Limited Ruq  Result Date: 09/13/2017 CLINICAL DATA:  57 year old female with right upper quadrant abdominal pain x3 days. EXAM: ULTRASOUND ABDOMEN LIMITED RIGHT UPPER QUADRANT COMPARISON:  CT of the abdomen pelvis dated 09/12/2017 FINDINGS: Gallbladder: The gallbladder is distended. There gallbladder wall  is irregular with multiple outpouchings. The gallbladder wall is thickened and edematous measuring 12 mm in thickness. There is sludge within the gallbladder. There is a small amount of pericholecystic fluid. Positive sonographic Murphy's  sign. There is a 7.5 x 4.8 x 6.2 cm complex area adjacent to the gallbladder which may represent an abscess or a contained perforation. Common bile duct: Diameter: 4 mm Liver: There is diffuse increased liver echogenicity most consistent with fatty infiltration. Portal vein is patent on color Doppler imaging with normal direction of blood flow towards the liver. IMPRESSION: 1. Gallbladder sludge with findings of acute cholecystitis. Complex collection adjacent to the gallbladder may represent abscess or focally contained perforation. 2. Fatty liver. 3. Patent main portal vein with hepatopetal flow. Electronically Signed   By: Anner Crete M.D.   On: 09/13/2017 00:12    Labs:  CBC: Recent Labs    09/16/17 0455 09/17/17 0509 09/18/17 0517 09/24/17 1520  WBC 5.8 6.4 6.5 7.3  HGB 8.2* 8.7* 9.1* 11.0*  HCT 27.5* 29.3* 30.6* 35.1*  PLT 494* 549* 556* 339.0    COAGS: Recent Labs    06/26/17 0751 09/13/17 0413 09/13/17 1211 09/13/17 2150 09/14/17 0841  INR 0.93  --   --   --   --   APTT  --  76* 75* 67* 96*    BMP: Recent Labs    09/16/17 0455 09/17/17 0509 09/18/17 0517 09/19/17 0459 09/24/17 1520  NA 143 146* 146*  --  140  K 3.3* 3.8 3.2*  --  3.8  CL 109 108 109  --  108  CO2 28 30 28   --  25  GLUCOSE 109* 92 86  --  97  BUN 5* <5* 5*  --  7  CALCIUM 8.2* 8.4* 8.2*  --  8.8  CREATININE 0.96 1.17* 1.15* 1.17* 1.15  GFRNONAA >60 51* 52* 51*  --   GFRAA >60 59* >60 59*  --     LIVER FUNCTION TESTS: Recent Labs    09/15/17 0406 09/16/17 0455 09/17/17 0509 09/24/17 1520  BILITOT 0.8 0.6 0.5 0.5  AST 23 20 17  40*  ALT 41 28 26 25   ALKPHOS 136* 129* 125 296*  PROT 6.6 6.1* 6.2* 7.1  ALBUMIN 1.9* 1.9* 2.0* 3.2*    TUMOR  MARKERS: No results for input(s): AFPTM, CEA, CA199, CHROMGRNA in the last 8760 hours.  Assessment:  Follow-up percutaneous cholecystostomy placed 8/31 by Dr. Laurence Ferrari for acalculous cholecystitis. Patient reports resolution of symptoms today, minimal bilious output from drain, she continues daily flushes without issues.   Drain injection performed today which showed patency to duodenum per Dr. Laurence Ferrari. Will stop flushing, start capping trial and repeat drain injection in 2 weeks with possible removal.   Electronically Signed: Joaquim Nam PA-C 10/08/2017, 1:24 PM   Please refer to Dr. Katrinka Blazing attestation of this note for management and plan.

## 2017-10-09 DIAGNOSIS — I130 Hypertensive heart and chronic kidney disease with heart failure and stage 1 through stage 4 chronic kidney disease, or unspecified chronic kidney disease: Secondary | ICD-10-CM | POA: Diagnosis not present

## 2017-10-09 DIAGNOSIS — J9611 Chronic respiratory failure with hypoxia: Secondary | ICD-10-CM | POA: Diagnosis not present

## 2017-10-09 DIAGNOSIS — I5030 Unspecified diastolic (congestive) heart failure: Secondary | ICD-10-CM | POA: Diagnosis not present

## 2017-10-09 DIAGNOSIS — Z434 Encounter for attention to other artificial openings of digestive tract: Secondary | ICD-10-CM | POA: Diagnosis not present

## 2017-10-09 DIAGNOSIS — J449 Chronic obstructive pulmonary disease, unspecified: Secondary | ICD-10-CM | POA: Diagnosis not present

## 2017-10-09 DIAGNOSIS — K819 Cholecystitis, unspecified: Secondary | ICD-10-CM | POA: Diagnosis not present

## 2017-10-13 ENCOUNTER — Emergency Department (HOSPITAL_COMMUNITY): Payer: Medicare Other

## 2017-10-13 ENCOUNTER — Telehealth: Payer: Self-pay | Admitting: Radiology

## 2017-10-13 ENCOUNTER — Encounter (HOSPITAL_COMMUNITY): Payer: Self-pay | Admitting: Family Medicine

## 2017-10-13 ENCOUNTER — Inpatient Hospital Stay (HOSPITAL_COMMUNITY)
Admission: EM | Admit: 2017-10-13 | Discharge: 2017-10-17 | DRG: 445 | Disposition: A | Discharge status: Home / Self Care | Payer: Medicare Other | Attending: Family Medicine | Admitting: Family Medicine

## 2017-10-13 DIAGNOSIS — Z8619 Personal history of other infectious and parasitic diseases: Secondary | ICD-10-CM

## 2017-10-13 DIAGNOSIS — Z7901 Long term (current) use of anticoagulants: Secondary | ICD-10-CM

## 2017-10-13 DIAGNOSIS — R7401 Elevation of levels of liver transaminase levels: Secondary | ICD-10-CM

## 2017-10-13 DIAGNOSIS — N390 Urinary tract infection, site not specified: Secondary | ICD-10-CM | POA: Diagnosis not present

## 2017-10-13 DIAGNOSIS — F339 Major depressive disorder, recurrent, unspecified: Secondary | ICD-10-CM | POA: Diagnosis not present

## 2017-10-13 DIAGNOSIS — Z9981 Dependence on supplemental oxygen: Secondary | ICD-10-CM

## 2017-10-13 DIAGNOSIS — R197 Diarrhea, unspecified: Secondary | ICD-10-CM | POA: Diagnosis not present

## 2017-10-13 DIAGNOSIS — K219 Gastro-esophageal reflux disease without esophagitis: Secondary | ICD-10-CM | POA: Diagnosis present

## 2017-10-13 DIAGNOSIS — I2510 Atherosclerotic heart disease of native coronary artery without angina pectoris: Secondary | ICD-10-CM | POA: Diagnosis present

## 2017-10-13 DIAGNOSIS — D860 Sarcoidosis of lung: Secondary | ICD-10-CM | POA: Diagnosis present

## 2017-10-13 DIAGNOSIS — Z87891 Personal history of nicotine dependence: Secondary | ICD-10-CM

## 2017-10-13 DIAGNOSIS — Z9109 Other allergy status, other than to drugs and biological substances: Secondary | ICD-10-CM

## 2017-10-13 DIAGNOSIS — E1122 Type 2 diabetes mellitus with diabetic chronic kidney disease: Secondary | ICD-10-CM | POA: Diagnosis present

## 2017-10-13 DIAGNOSIS — R1011 Right upper quadrant pain: Secondary | ICD-10-CM | POA: Diagnosis not present

## 2017-10-13 DIAGNOSIS — Z79899 Other long term (current) drug therapy: Secondary | ICD-10-CM

## 2017-10-13 DIAGNOSIS — K760 Fatty (change of) liver, not elsewhere classified: Secondary | ICD-10-CM | POA: Diagnosis present

## 2017-10-13 DIAGNOSIS — F419 Anxiety disorder, unspecified: Secondary | ICD-10-CM | POA: Diagnosis present

## 2017-10-13 DIAGNOSIS — J9611 Chronic respiratory failure with hypoxia: Secondary | ICD-10-CM | POA: Diagnosis not present

## 2017-10-13 DIAGNOSIS — K819 Cholecystitis, unspecified: Secondary | ICD-10-CM | POA: Diagnosis not present

## 2017-10-13 DIAGNOSIS — E66813 Obesity, class 3: Secondary | ICD-10-CM

## 2017-10-13 DIAGNOSIS — R109 Unspecified abdominal pain: Secondary | ICD-10-CM | POA: Diagnosis not present

## 2017-10-13 DIAGNOSIS — G4733 Obstructive sleep apnea (adult) (pediatric): Secondary | ICD-10-CM | POA: Diagnosis present

## 2017-10-13 DIAGNOSIS — R7989 Other specified abnormal findings of blood chemistry: Secondary | ICD-10-CM | POA: Diagnosis present

## 2017-10-13 DIAGNOSIS — J449 Chronic obstructive pulmonary disease, unspecified: Secondary | ICD-10-CM | POA: Diagnosis present

## 2017-10-13 DIAGNOSIS — Z91040 Latex allergy status: Secondary | ICD-10-CM

## 2017-10-13 DIAGNOSIS — Z881 Allergy status to other antibiotic agents status: Secondary | ICD-10-CM

## 2017-10-13 DIAGNOSIS — Z8673 Personal history of transient ischemic attack (TIA), and cerebral infarction without residual deficits: Secondary | ICD-10-CM

## 2017-10-13 DIAGNOSIS — Z91048 Other nonmedicinal substance allergy status: Secondary | ICD-10-CM

## 2017-10-13 DIAGNOSIS — Z91041 Radiographic dye allergy status: Secondary | ICD-10-CM

## 2017-10-13 DIAGNOSIS — M1990 Unspecified osteoarthritis, unspecified site: Secondary | ICD-10-CM | POA: Diagnosis present

## 2017-10-13 DIAGNOSIS — Z888 Allergy status to other drugs, medicaments and biological substances status: Secondary | ICD-10-CM

## 2017-10-13 DIAGNOSIS — R740 Nonspecific elevation of levels of transaminase and lactic acid dehydrogenase [LDH]: Secondary | ICD-10-CM | POA: Diagnosis not present

## 2017-10-13 DIAGNOSIS — Z96653 Presence of artificial knee joint, bilateral: Secondary | ICD-10-CM | POA: Diagnosis present

## 2017-10-13 DIAGNOSIS — M069 Rheumatoid arthritis, unspecified: Secondary | ICD-10-CM | POA: Diagnosis present

## 2017-10-13 DIAGNOSIS — Z7984 Long term (current) use of oral hypoglycemic drugs: Secondary | ICD-10-CM

## 2017-10-13 DIAGNOSIS — M797 Fibromyalgia: Secondary | ICD-10-CM | POA: Diagnosis present

## 2017-10-13 DIAGNOSIS — B964 Proteus (mirabilis) (morganii) as the cause of diseases classified elsewhere: Secondary | ICD-10-CM | POA: Diagnosis present

## 2017-10-13 DIAGNOSIS — Z978 Presence of other specified devices: Secondary | ICD-10-CM

## 2017-10-13 DIAGNOSIS — N183 Chronic kidney disease, stage 3 (moderate): Secondary | ICD-10-CM | POA: Diagnosis not present

## 2017-10-13 DIAGNOSIS — I10 Essential (primary) hypertension: Secondary | ICD-10-CM | POA: Diagnosis present

## 2017-10-13 DIAGNOSIS — K810 Acute cholecystitis: Secondary | ICD-10-CM

## 2017-10-13 DIAGNOSIS — Z1639 Resistance to other specified antimicrobial drug: Secondary | ICD-10-CM | POA: Diagnosis present

## 2017-10-13 DIAGNOSIS — Z9049 Acquired absence of other specified parts of digestive tract: Secondary | ICD-10-CM

## 2017-10-13 DIAGNOSIS — Z86711 Personal history of pulmonary embolism: Secondary | ICD-10-CM

## 2017-10-13 DIAGNOSIS — D869 Sarcoidosis, unspecified: Secondary | ICD-10-CM | POA: Diagnosis present

## 2017-10-13 DIAGNOSIS — R945 Abnormal results of liver function studies: Secondary | ICD-10-CM

## 2017-10-13 DIAGNOSIS — K828 Other specified diseases of gallbladder: Secondary | ICD-10-CM | POA: Diagnosis present

## 2017-10-13 DIAGNOSIS — Z86718 Personal history of other venous thrombosis and embolism: Secondary | ICD-10-CM

## 2017-10-13 DIAGNOSIS — I129 Hypertensive chronic kidney disease with stage 1 through stage 4 chronic kidney disease, or unspecified chronic kidney disease: Secondary | ICD-10-CM | POA: Diagnosis present

## 2017-10-13 DIAGNOSIS — Z7951 Long term (current) use of inhaled steroids: Secondary | ICD-10-CM

## 2017-10-13 LAB — URINALYSIS, ROUTINE W REFLEX MICROSCOPIC
GLUCOSE, UA: NEGATIVE mg/dL
Ketones, ur: 5 mg/dL — AB
Nitrite: NEGATIVE
PROTEIN: 30 mg/dL — AB
Specific Gravity, Urine: 1.018 (ref 1.005–1.030)
pH: 7 (ref 5.0–8.0)

## 2017-10-13 LAB — DIFFERENTIAL
Basophils Absolute: 0 10*3/uL (ref 0.0–0.1)
Basophils Relative: 0 %
EOS ABS: 0.3 10*3/uL (ref 0.0–0.7)
Eosinophils Relative: 4 %
LYMPHS ABS: 2.9 10*3/uL (ref 0.7–4.0)
Lymphocytes Relative: 42 %
MONO ABS: 0.6 10*3/uL (ref 0.1–1.0)
MONOS PCT: 8 %
NEUTROS ABS: 3.2 10*3/uL (ref 1.7–7.7)
Neutrophils Relative %: 46 %

## 2017-10-13 LAB — COMPREHENSIVE METABOLIC PANEL
ALK PHOS: 305 U/L — AB (ref 38–126)
ALT: 116 U/L — AB (ref 0–44)
AST: 190 U/L — AB (ref 15–41)
Albumin: 2.9 g/dL — ABNORMAL LOW (ref 3.5–5.0)
Anion gap: 11 (ref 5–15)
BUN: 14 mg/dL (ref 6–20)
CALCIUM: 9.5 mg/dL (ref 8.9–10.3)
CO2: 23 mmol/L (ref 22–32)
CREATININE: 0.96 mg/dL (ref 0.44–1.00)
Chloride: 111 mmol/L (ref 98–111)
GFR calc non Af Amer: >60 mL/min (ref >60)
Glucose, Bld: 96 mg/dL (ref 70–99)
Potassium: 4.1 mmol/L (ref 3.5–5.1)
SODIUM: 145 mmol/L (ref 135–145)
Total Bilirubin: 3.3 mg/dL — ABNORMAL HIGH (ref 0.3–1.2)
Total Protein: 7.3 g/dL (ref 6.5–8.1)

## 2017-10-13 LAB — CBC
HEMATOCRIT: 33.7 % — AB (ref 36.0–46.0)
HEMOGLOBIN: 10.5 g/dL — AB (ref 12.0–15.0)
MCH: 28.4 pg (ref 26.0–34.0)
MCHC: 31.2 g/dL (ref 30.0–36.0)
MCV: 91.1 fL (ref 78.0–100.0)
Platelets: 253 10*3/uL (ref 150–400)
RBC: 3.7 MIL/uL — AB (ref 3.87–5.11)
RDW: 17.3 % — ABNORMAL HIGH (ref 11.5–15.5)
WBC: 7 10*3/uL (ref 4.0–10.5)

## 2017-10-13 LAB — I-STAT BETA HCG BLOOD, ED (MC, WL, AP ONLY): I-stat hCG, quantitative: 5.8 m[IU]/mL — ABNORMAL HIGH (ref <5)

## 2017-10-13 LAB — LIPASE, BLOOD: Lipase: 33 U/L (ref 11–51)

## 2017-10-13 LAB — PROTIME-INR
INR: 1.34
Prothrombin Time: 16.5 seconds — ABNORMAL HIGH (ref 11.4–15.2)

## 2017-10-13 MED ORDER — IOPAMIDOL (ISOVUE-300) INJECTION 61%
100.0000 mL | Freq: Once | INTRAVENOUS | Status: AC | PRN
  Administered 2017-10-13: 100 mL via INTRAVENOUS

## 2017-10-13 MED ORDER — SODIUM CHLORIDE 0.9 % IV BOLUS
1000.0000 mL | Freq: Once | INTRAVENOUS | Status: AC
  Administered 2017-10-13: 1000 mL via INTRAVENOUS

## 2017-10-13 MED ORDER — KETOROLAC TROMETHAMINE 15 MG/ML IJ SOLN
15.0000 mg | Freq: Once | INTRAMUSCULAR | Status: AC
  Administered 2017-10-13: 15 mg via INTRAVENOUS
  Filled 2017-10-13: qty 1

## 2017-10-13 MED ORDER — IOPAMIDOL (ISOVUE-300) INJECTION 61%
INTRAVENOUS | Status: AC
  Filled 2017-10-13: qty 100

## 2017-10-13 NOTE — ED Notes (Signed)
Patient transported to CT 

## 2017-10-13 NOTE — ED Notes (Signed)
Unsuccessful IV attempt x2. Sonya RN attempting IV at this time.

## 2017-10-13 NOTE — Telephone Encounter (Signed)
Patient phoned stating that starting Friday 10/10/2017 she has been experiencing dark urine, has been weak and lethargic, poor appetite, fluid intake "just ok". Although she did not check her T, she thinks she may have been febrile 10/10/2017 and 10/12/2017. Dr Laurence Ferrari contact and recommends that the patient go to the ED.  Patient informed and states that she will go to [facility] ED.      Karyme Mcconathy Riki Rusk, RN 10/13/2017 12:22 PM

## 2017-10-13 NOTE — ED Provider Notes (Signed)
[facility] DEPT Provider Note   CSN: [id number] Arrival date & time: 10/13/17  1443     History   Chief Complaint Chief Complaint  Patient presents with  . [name]  . Diarrhea    HPI [name] is a 57 y.o. female with a history of diabetes mellitus, sarcoidosis on home O2 and Humira, CAD, osteoarthritis, GERD, HTN, OSA on CPAP, DVT/PE on Xarelto, and COPD who presents to the emergency department by EMS with a chief complaint of [name] x1 day.   The patient endorses right upper quadrant Garrison that she characterizes as sharp.  The Garrison radiates to her right flank.  No known aggravating or alleviating factors.  She reports decreased appetite over the last few days.  She reports that her urine looked dark brown yesterday.  She reports that her lips have felt dry over the last 3 days.  She reports that yesterday she was ambulating with her walker and felt very dizzy when she stood up and was afraid she might fall over.  She denies fever, nausea, vomiting, headache, chest Garrison, or dyspnea.  Reports she had one episode of nonbloody diarrhea today.  She was admitted 09/12/17 to 09/19/17 with sepsis from cholecystitis with possible intrahepatic and peri-cholecystic abscess.  She was determined not to be a good surgical candidate and was treated with a percutaneous drain placed on 8/31 by IR.  She was started on Bactrim 1 DS twice daily for 3 weeks per Dr. Megan Salon with ID.  Last day of antibiotics was 10/02/2017.  She was last seen by Dr. Megan Salon in the clinic on 10/06/2017.  She was seen by IR on 10/08/2017 and had the bag from her drain removed.  She had a dose of Humira last week, but did not take her dose today.  The history is provided by the patient. No language interpreter was used.    Past Medical History:  Diagnosis Date  . Allergic rhinitis   . ALLERGIC RHINITIS 10/26/2009  . Anxiety   . ANXIETY 08/14/2006  . B12 DEFICIENCY  08/25/2007  . Complication of anesthesia    pt has had difficulty following anesthesia with her knee in 2016-unable to care for herself afterward  . Confusion   . Depression    takes Lexapro daily  . Diabetes mellitus without complication ([place])    was on insulin but has been off since Nov 2015 and now only takes Metformin daily  . DYSPNEA 04/28/2009   with exertion  . Esophageal reflux    takes Nexium daily  . Fibromyalgia   . Headache    last migraine 2-108yr ago;takes Topamax daily  . History of shingles   . Hypertension    takes Coreg daily  . Insomnia    takes Nortriptyline nightly   . Joint Garrison   . Joint swelling   . Left knee Garrison   . Lichen planus   . Long-term memory loss   . Nocturia   . OSA (obstructive sleep apnea)    doesn't use CPAP;sleep study in epic from 2006  . Osteoarthritis   . Osteoarthritis   . Pneumonia    over 30 yrs ago  . Protein calorie malnutrition ([place]   . Rheumatoid arthritis ([place]   . Sarcoidosis    Dr. ZLolita Patella . Short-term memory loss   . Shortness of breath   . TIA (transient ischemic attack)   . Unsteady gait   . Urinary urgency   . Vitamin  D deficiency    is supposed to take Vit D but can't afford it  . VITAMIN D DEFICIENCY 08/25/2007    Patient Active Problem List   Diagnosis Date Noted  . Anemia 09/24/2017  . Migraines 09/17/2017  . Acute cholecystitis 09/12/2017  . Sepsis ([place]) 09/12/2017  . COPD (chronic obstructive pulmonary disease) ([place]) 12/30/2016  . Diastolic dysfunction 07/14/1599  . OSA (obstructive sleep apnea) 09/30/2016  . Pulmonary embolism ([place]) 07/21/2016  . DVT (deep venous thrombosis) ([place]) 07/21/2016  . Weight gain 12/20/2015  . Controlled type 2 diabetes mellitus with chronic kidney disease, without long-term current use of insulin ([place]) 02/01/2015  . Abnormal SPEP 06/04/2013  . Memory loss 04/27/2013  . Panic attacks 03/17/2013  . Pulmonary nodules 07/20/2012  . CAD in native artery 05/28/2012  .  Seborrheic dermatitis 01/20/2012  . Obesity 03/07/2010  . INSOMNIA, CHRONIC 03/07/2010  . Allergic rhinitis 10/26/2009  . Essential hypertension 08/27/2007  . B12 deficiency 08/25/2007  . Vitamin D deficiency 08/25/2007  . Osteoarthritis 02/02/2007  . Sarcoidosis 08/14/2006  . Anxiety state 08/14/2006  . Major depressive disorder, recurrent episode ([place]) 08/14/2006  . GERD 08/14/2006    Past Surgical History:  Procedure Laterality Date  . APPENDECTOMY    . arthroscopic knee surgery Right 11-12-04  . AXILLARY ABCESS IRRIGATION AND DEBRIDEMENT  Jul & Aug2012  . CARPAL TUNNEL RELEASE Left 05/23/2014   Procedure: CARPAL TUNNEL RELEASE;  Surgeon: Meredith Pel, MD;  Location: [facility];  Service: Orthopedics;  Laterality: Left;  . cyst removed from top of buttocks  at age 51  . ENDOMETRIAL ABLATION    . IR PERC CHOLECYSTOSTOMY  09/13/2017  . IR RADIOLOGIST EVAL & MGMT  10/08/2017  . LACRIMAL DUCT EXPLORATION Right 06/26/2017   Procedure: LACRIMAL DUCT EXPLORATION AND ETHMOIDECTOMY;  Surgeon: Clista Bernhardt, MD;  Location: [facility];  Service: Ophthalmology;  Laterality: Right;  . TEAR DUCT PROBING Right 06/26/2017   Procedure: TEAR DUCT PROBING WITH STENT;  Surgeon: Clista Bernhardt, MD;  Location: [facility];  Service: Ophthalmology;  Laterality: Right;  . TOTAL KNEE ARTHROPLASTY Right 11/15/2014   Procedure: TOTAL RIGHT KNEE ARTHROPLASTY;  Surgeon: Meredith Pel, MD;  Location: [facility];  Service: Orthopedics;  Laterality: Right;  . TOTAL KNEE ARTHROPLASTY Left 07/13/2015   Procedure: LEFT TOTAL KNEE ARTHROPLASTY;  Surgeon: Meredith Pel, MD;  Location: [facility];  Service: Orthopedics;  Laterality: Left;     OB History   None      Home Medications    Prior to Admission medications   Medication Sig Start Date End Date Taking? Authorizing Provider  acetaminophen (TYLENOL) 325 MG tablet Take 2 tablets (650 mg total) by mouth every 6 (six) hours as needed for mild Garrison, fever or  headache. 09/19/17  Yes Debbe Odea, MD  augmented betamethasone dipropionate (DIPROLENE-AF) 0.05 % ointment Apply topically 2 (two) times daily. 09/30/17  Yes [provider]  budesonide-formoterol (SYMBICORT) 160-4.5 MCG/ACT inhaler Inhale 2 puffs into the lungs TWICE DAILY 120/4=30 06/10/17  Yes Rigoberto Noel, MD  carvedilol (COREG) 12.5 MG tablet Take 1 tablet (12.5 mg total) by mouth 2 (two) times daily with a meal. 06/12/17  Yes Plotnikov, Evie Lacks, MD  clonazePAM (KLONOPIN) 0.5 MG tablet Take 1-2 tablets (0.5-1 mg total) by mouth at bedtime. Patient taking differently: Take 0.5 mg by mouth at bedtime.  08/04/17  Yes Plotnikov, Evie Lacks, MD  esomeprazole (NEXIUM) 40 MG capsule Take 1 capsule (40 mg total)  by mouth daily before breakfast. 08/07/17  Yes Plotnikov, Evie Lacks, MD  JANUVIA 50 MG tablet Take 1 tablet (50 mg total) by mouth daily. Patient taking differently: Take 50 mg by mouth daily.  09/10/17  Yes Philemon Kingdom, MD  metFORMIN (GLUCOPHAGE-XR) 500 MG 24 hr tablet TAKE 2 TABLETS(1000 MG) BY MOUTH DAILY WITH BREAKFAST 06/12/17  Yes Plotnikov, Evie Lacks, MD  oxyCODONE (OXY IR/ROXICODONE) 5 MG immediate release tablet Take 1 tablet (5 mg total) by mouth every 4 (four) hours as needed for moderate Garrison. 09/19/17  Yes Debbe Odea, MD  polyethylene glycol (MIRALAX / GLYCOLAX) packet Take 17 g by mouth daily. 09/19/17  Yes Debbe Odea, MD  potassium chloride (K-DUR) 10 MEQ tablet Take 1 tablet (10 mEq total) by mouth daily. 09/24/17  Yes Plotnikov, Evie Lacks, MD  topiramate (TOPAMAX) 50 MG tablet Take 1 tablet (50 mg total) by mouth 2 (two) times daily. 06/12/17  Yes Plotnikov, Evie Lacks, MD  venlafaxine XR (EFFEXOR-XR) 75 MG 24 hr capsule Take 1 capsule (75 mg total) by mouth daily with breakfast. 03/04/17  Yes Plotnikov, Evie Lacks, MD  XARELTO 20 MG TABS tablet Take 1 tablet (20 mg total) by mouth at bedtime. 06/12/17  Yes Plotnikov, Evie Lacks, MD  Adalimumab (HUMIRA) 40 MG/0.8ML PSKT  Inject 40 mg into the skin every 7 (seven) days.     [provider]  B-D ULTRA-FINE 33 LANCETS MISC Use as instructed to test sugars 4 times daily DX: E11.8 09/22/17   Philemon Kingdom, MD  furosemide (LASIX) 40 MG tablet TAKE 1 TABLET BY MOUTH EVERY MORNING FOR EDEMA Patient taking differently: Take 20 mg by mouth daily as needed for fluid or edema.  01/10/17   Plotnikov, Evie Lacks, MD  gabapentin (NEURONTIN) 300 MG capsule Take 1 capsule (300 mg total) by mouth 2 (two) times daily as needed (for Garrison). 06/12/17   Plotnikov, Evie Lacks, MD  glucose blood (EZ SMART PLUS GLUCOSE TEST) test strip Use as instructed to test sugars 4 times daily DX: E11.8 09/22/17   Philemon Kingdom, MD  promethazine (PHENERGAN) 25 MG tablet Take 1 tablet (25 mg total) by mouth every 8 (eight) hours as needed for nausea or vomiting. Patient not taking: Reported on 10/06/2017 09/03/17   Plotnikov, Evie Lacks, MD  VENTOLIN HFA 108 (90 Base) MCG/ACT inhaler Inhale 2 puffs into the lungs every 6 (six) hours as needed for wheezing or shortness of breath. 200/8=25 Patient taking differently: Inhale 2 puffs into the lungs every 6 (six) hours as needed for wheezing or shortness of breath.  12/11/16   Plotnikov, Evie Lacks, MD  VOLTAREN 1 % GEL Apply 2 g topically 2 (two) times daily as needed (for Garrison). 100/4=25 Patient taking differently: Apply 4 g topically 4 (four) times daily as needed (Garrison).  04/29/17   Plotnikov, Evie Lacks, MD    Family History Family History  Problem Relation Age of Onset  . Heart disease Mother   . Kidney disease Mother   . Cancer Father        leukemia  . Hypertension Unknown   . Coronary artery disease Unknown        female 1st degree relative <60  . Heart failure Unknown        congestive  . Coronary artery disease Unknown        Female 1st degree relative <50  . Breast cancer Unknown        1st degree relative <50 S  . Breast  cancer Sister     Social History Social History    Tobacco Use  . Smoking status: Former Smoker    Packs/day: 0.50    Years: 10.00    Pack years: 5.00    Types: Cigarettes  . Smokeless tobacco: Never Used  . Tobacco comment: quit smoking in 2004  Substance Use Topics  . Alcohol use: No  . Drug use: No     Allergies   Belsomra [suvorexant]; Enalapril maleate; Latex; Nickel; Other; Iodine; Doxycycline; Hydrochlorothiazide; Hydroxychloroquine sulfate; Lyrica [pregabalin]; and Tape   Review of Systems Review of Systems  Constitutional: Negative for activity change, chills and fever.  HENT: Negative for congestion and sore throat.   Eyes: Negative for visual disturbance.  Respiratory: Negative for shortness of breath and wheezing.   Cardiovascular: Negative for chest Garrison, palpitations and leg swelling.  Gastrointestinal: Positive for [name], diarrhea and nausea. Negative for vomiting.  Genitourinary: Negative for dysuria, flank Garrison and urgency.       Dark urine   Musculoskeletal: Negative for back Garrison.  Skin: Negative for rash.  Allergic/Immunologic: Negative for immunocompromised state.  Neurological: Negative for dizziness, weakness and headaches.  Psychiatric/Behavioral: Negative for confusion.   Physical Exam Updated Vital Signs BP 138/76   Pulse 76   Temp (!) 96.8 F (36 C) (Rectal)   Resp 18   Ht 5' 7" (1.702 m)   Wt 116.6 kg   SpO2 99%   BMI 40.25 kg/m   Physical Exam  Constitutional: No distress.  [place] in place.  Appears much older than stated age.  HENT:  Head: Normocephalic.  Lips appear dry.  Eyes: Conjunctivae are normal.  Neck: Normal range of motion. Neck supple.  Cardiovascular: Normal rate and regular rhythm. Exam reveals no gallop and no friction rub.  No murmur heard. Pulmonary/Chest: Effort normal. No stridor. No respiratory distress. She has no wheezes. She has no rales. She exhibits no tenderness.  Angela: Soft. She exhibits no distension. There is tenderness in the right upper  quadrant. There is rebound. There is no rigidity, no guarding, no tenderness at McBurney's point and negative Murphy's sign.  Obese abdomen.  Abdomen is soft, nondistended.  Tender to palpation in the right upper quadrant with rebound.  Clamped off and drain in the right upper quadrant in place.  No surrounding erythema, redness, or warmth.  No guarding.  Mild tenderness in the right lower quadrant without rebound.  No CVA tenderness bilaterally.  Left abdomen is nontender.   Neurological: She is alert.  Skin: Skin is warm. Capillary refill takes 2 to 3 seconds. No rash noted.  Psychiatric: Her behavior is normal.  Nursing note and vitals reviewed.  ED Treatments / Results  Labs (all labs ordered are listed, but only abnormal results are displayed) Labs Reviewed  COMPREHENSIVE METABOLIC PANEL - Abnormal; Notable for the following components:      Result Value   Albumin 2.9 (*)    AST 190 (*)    ALT 116 (*)    Alkaline Phosphatase 305 (*)    Total Bilirubin 3.3 (*)    All other components within normal limits  CBC - Abnormal; Notable for the following components:   RBC 3.70 (*)    Hemoglobin 10.5 (*)    HCT 33.7 (*)    RDW 17.3 (*)    All other components within normal limits  URINALYSIS, ROUTINE W REFLEX MICROSCOPIC - Abnormal; Notable for the following components:   Color, Urine AMBER (*)  APPearance CLOUDY (*)    Hgb urine dipstick SMALL (*)    Bilirubin Urine SMALL (*)    Ketones, ur 5 (*)    Protein, ur 30 (*)    Leukocytes, UA MODERATE (*)    WBC, UA >50 (*)    Bacteria, UA RARE (*)    All other components within normal limits  PROTIME-INR - Abnormal; Notable for the following components:   Prothrombin Time 16.5 (*)    All other components within normal limits  I-STAT BETA HCG BLOOD, ED ([facility], [facility], [facility] ONLY) - Abnormal; Notable for the following components:   I-stat hCG, quantitative 5.8 (*)    All other components within normal limits  URINE CULTURE  LIPASE, BLOOD    DIFFERENTIAL    EKG None  Radiology Ct Abdomen Pelvis W Contrast  Result Date: 10/13/2017 CLINICAL DATA:  [name], recent cholecystitis, status post percutaneous cholecystostomy 09/12/2017 EXAM: CT ABDOMEN AND PELVIS WITH CONTRAST TECHNIQUE: Multidetector CT imaging of the abdomen and pelvis was performed using the standard protocol following bolus administration of intravenous contrast. CONTRAST:  161m ISOVUE-300 IOPAMIDOL (ISOVUE-300) INJECTION 61% COMPARISON:  09/12/2017 FINDINGS: Lower chest: Similar bibasilar nodular scarring/atelectasis. No pleural or pericardial effusion. Normal heart size for age. Degenerative changes of the thoracic spine on the right. Hepatobiliary: Mild hypoattenuation of the liver suggesting hepatic steatosis. No focal hepatic abnormality or biliary obstruction. No surrounding perihepatic free fluid, hemorrhage, or subcapsular hematoma. Interval right upper quadrant transhepatic cholecystostomy within the gallbladder. There is interval improvement in the gallbladder distension and surrounding pericholecystic fluid inflammation. Gallbladder is decompressed. Common bile duct nondilated. Pancreas: Unremarkable. No pancreatic ductal dilatation or surrounding inflammatory changes. Spleen: Normal in size without focal abnormality. Adrenals/Urinary Tract: Normal adrenal glands. Normal symmetric renal enhancement and excretion. 12 mm right renal cortical exophytic cyst as before. Ureters are symmetric and decompressed. No obstructing ureteral calculus. Bladder is underdistended. Stomach/Bowel: Negative for bowel obstruction, significant dilatation, ileus, or free air. Scattered colonic diverticulosis. Remote appendectomy noted. No fluid collection or abscess. No ascites. Vascular/Lymphatic: Aortic atherosclerosis. Negative for aneurysm or occlusive process. Mesenteric and renal vasculature appear patent. No adenopathy. Reproductive: Uterus and adnexa normal in size. Uterus is  retroverted. No pelvic fluid collection, hemorrhage, abscess or hematoma. Other: No Angela wall hernia or abnormality. No abdominopelvic ascites. Musculoskeletal: Degenerative changes of the spine. No acute osseous finding. Lower lumbar facet arthropathy noted IMPRESSION: Status post interval percutaneous transhepatic cholecystostomy. Cholecystostomy catheter in good position within the gallbladder. There is improvement in the gallbladder distention, pericholecystic fluid and inflammation compatible with resolving cholecystitis. No biliary dilatation or obstruction Mild hepatic steatosis No fluid collection or abscess Other chronic findings as above Electronically Signed   By: MJerilynn Mages  Shick M.D.   On: 10/13/2017 22:23    Procedures Procedures (including critical care time)  Medications Ordered in ED Medications  iopamidol (ISOVUE-300) 61 % injection (has no administration in time range)  ketorolac (TORADOL) 15 MG/ML injection 15 mg (15 mg Intravenous Given 10/13/17 2115)  sodium chloride 0.9 % bolus 1,000 mL (1,000 mLs Intravenous New Bag/Given 10/13/17 2116)  iopamidol (ISOVUE-300) 61 % injection 100 mL (100 mLs Intravenous Contrast Given 10/13/17 2146)     Initial Impression / Assessment and Plan / ED Course  I have reviewed the triage vital signs and the nursing notes.  Pertinent labs & imaging results that were available during my care of the patient were reviewed by me and considered in my medical decision making (see chart for details).  57 year old female with a  history of diabetes mellitus, sarcoidosis on home O2 and Humira, CAD, osteoarthritis, GERD, HTN, OSA on CPAP, DVT/PE on Xarelto, and COPD who presents to the emergency department with right upper quadrant Garrison x1 day and dark brown urine x2 days.  No constitutional symptoms.  Her lips appear dry on exam and capillary refill is increased.  She appears clinically dehydrated.  She was discussed with Dr. Dayna Barker, attending  physician.  Labs are notable for INR of 1.34, AST of 190, ALT 116, alk phos 305, and total bili of 3.3.  Labs on 09/24/2017 with AST 40, ALT 25, total bilirubin of 0.5, and alk phos of 296.  No leukocytosis.  UA with the moderate leukocyte esterase, pyuria, small bilirubinuria and hemoglobinuria, and clumps.  She has no urinary symptoms.  Urine culture sent.  CT abdomen pelvis with interval improvement in cholecystitis.  No abscesses.  No other acute findings.  Reviewed right upper quadrant ultrasound from 09/13/2017, which demonstrated biliary sludge, but no stones.  She reports that she will only take Tylenol for Garrison control because narcotics make her have a headache.  Given elevated transaminases, since the patient has normal kidney function will give her a dose of Toradol. IVF bolus given.   Spoke with Dr. Havery Moros with gastroenterology who recommended RUQ, keeping the patient NPO, ABX if the patient develops a fever.  He feels that she will most likely need evaluation by IR in the AM.  GI will follow and evaluate the patient in the morning.  She reports Garrison is significantly improved on reevaluation.  Consulted the hospitalist team and spoke with Dr. Theresa Duty who will accept the patient for admission. The patient appears reasonably stabilized for admission considering the current resources, flow, and capabilities available in the ED at this time, and I doubt any other [facility] requiring further screening and/or treatment in the ED prior to admission.  Final Clinical Impressions(s) / ED Diagnoses   Final diagnoses:  RUQ [name]  Elevated transaminase level    ED Discharge Orders    None       Roby Spalla A, PA-C 10/14/17 0000    Mesner, Corene Cornea, MD 10/14/17 1453

## 2017-10-13 NOTE — ED Triage Notes (Signed)
Patient is from home and transported via [facility]. Patient had percutaneous cholecystomy on 09/13/2017. On Wednesday, she had the bag removed. According to EMS,  she is experienicing abd pain with mild diarrhea. Denies nausea, vomiting, swelling, or redness.

## 2017-10-14 ENCOUNTER — Other Ambulatory Visit: Payer: Self-pay

## 2017-10-14 ENCOUNTER — Emergency Department (HOSPITAL_COMMUNITY): Payer: Medicare Other

## 2017-10-14 DIAGNOSIS — N390 Urinary tract infection, site not specified: Secondary | ICD-10-CM | POA: Diagnosis present

## 2017-10-14 DIAGNOSIS — D860 Sarcoidosis of lung: Secondary | ICD-10-CM | POA: Diagnosis present

## 2017-10-14 DIAGNOSIS — J449 Chronic obstructive pulmonary disease, unspecified: Secondary | ICD-10-CM | POA: Diagnosis present

## 2017-10-14 DIAGNOSIS — Z9981 Dependence on supplemental oxygen: Secondary | ICD-10-CM | POA: Diagnosis not present

## 2017-10-14 DIAGNOSIS — F419 Anxiety disorder, unspecified: Secondary | ICD-10-CM | POA: Diagnosis present

## 2017-10-14 DIAGNOSIS — I2510 Atherosclerotic heart disease of native coronary artery without angina pectoris: Secondary | ICD-10-CM | POA: Diagnosis present

## 2017-10-14 DIAGNOSIS — M797 Fibromyalgia: Secondary | ICD-10-CM | POA: Diagnosis present

## 2017-10-14 DIAGNOSIS — D869 Sarcoidosis, unspecified: Secondary | ICD-10-CM

## 2017-10-14 DIAGNOSIS — B964 Proteus (mirabilis) (morganii) as the cause of diseases classified elsewhere: Secondary | ICD-10-CM | POA: Diagnosis present

## 2017-10-14 DIAGNOSIS — J9611 Chronic respiratory failure with hypoxia: Secondary | ICD-10-CM | POA: Diagnosis present

## 2017-10-14 DIAGNOSIS — R319 Hematuria, unspecified: Secondary | ICD-10-CM | POA: Diagnosis not present

## 2017-10-14 DIAGNOSIS — R945 Abnormal results of liver function studies: Secondary | ICD-10-CM | POA: Diagnosis not present

## 2017-10-14 DIAGNOSIS — R300 Dysuria: Secondary | ICD-10-CM | POA: Diagnosis not present

## 2017-10-14 DIAGNOSIS — K819 Cholecystitis, unspecified: Secondary | ICD-10-CM | POA: Diagnosis present

## 2017-10-14 DIAGNOSIS — I10 Essential (primary) hypertension: Secondary | ICD-10-CM | POA: Diagnosis not present

## 2017-10-14 DIAGNOSIS — K219 Gastro-esophageal reflux disease without esophagitis: Secondary | ICD-10-CM | POA: Diagnosis present

## 2017-10-14 DIAGNOSIS — K7689 Other specified diseases of liver: Secondary | ICD-10-CM | POA: Diagnosis not present

## 2017-10-14 DIAGNOSIS — G4733 Obstructive sleep apnea (adult) (pediatric): Secondary | ICD-10-CM | POA: Diagnosis present

## 2017-10-14 DIAGNOSIS — R1011 Right upper quadrant pain: Secondary | ICD-10-CM | POA: Diagnosis not present

## 2017-10-14 DIAGNOSIS — K760 Fatty (change of) liver, not elsewhere classified: Secondary | ICD-10-CM | POA: Diagnosis present

## 2017-10-14 DIAGNOSIS — R109 Unspecified abdominal pain: Secondary | ICD-10-CM | POA: Diagnosis not present

## 2017-10-14 DIAGNOSIS — R7989 Other specified abnormal findings of blood chemistry: Secondary | ICD-10-CM | POA: Diagnosis present

## 2017-10-14 DIAGNOSIS — M1990 Unspecified osteoarthritis, unspecified site: Secondary | ICD-10-CM | POA: Diagnosis present

## 2017-10-14 DIAGNOSIS — E1122 Type 2 diabetes mellitus with diabetic chronic kidney disease: Secondary | ICD-10-CM | POA: Diagnosis present

## 2017-10-14 DIAGNOSIS — N182 Chronic kidney disease, stage 2 (mild): Secondary | ICD-10-CM | POA: Diagnosis not present

## 2017-10-14 DIAGNOSIS — K810 Acute cholecystitis: Secondary | ICD-10-CM | POA: Diagnosis not present

## 2017-10-14 DIAGNOSIS — M069 Rheumatoid arthritis, unspecified: Secondary | ICD-10-CM | POA: Diagnosis present

## 2017-10-14 DIAGNOSIS — N183 Chronic kidney disease, stage 3 (moderate): Secondary | ICD-10-CM | POA: Diagnosis present

## 2017-10-14 DIAGNOSIS — F339 Major depressive disorder, recurrent, unspecified: Secondary | ICD-10-CM | POA: Diagnosis present

## 2017-10-14 DIAGNOSIS — K828 Other specified diseases of gallbladder: Secondary | ICD-10-CM | POA: Diagnosis present

## 2017-10-14 DIAGNOSIS — Z96653 Presence of artificial knee joint, bilateral: Secondary | ICD-10-CM | POA: Diagnosis present

## 2017-10-14 DIAGNOSIS — I129 Hypertensive chronic kidney disease with stage 1 through stage 4 chronic kidney disease, or unspecified chronic kidney disease: Secondary | ICD-10-CM | POA: Diagnosis present

## 2017-10-14 DIAGNOSIS — Z1639 Resistance to other specified antimicrobial drug: Secondary | ICD-10-CM | POA: Diagnosis present

## 2017-10-14 LAB — COMPREHENSIVE METABOLIC PANEL
ALT: 126 U/L — ABNORMAL HIGH (ref 0–44)
ANION GAP: 8 (ref 5–15)
AST: 230 U/L — ABNORMAL HIGH (ref 15–41)
Albumin: 2.7 g/dL — ABNORMAL LOW (ref 3.5–5.0)
Alkaline Phosphatase: 295 U/L — ABNORMAL HIGH (ref 38–126)
BUN: 13 mg/dL (ref 6–20)
CHLORIDE: 111 mmol/L (ref 98–111)
CO2: 23 mmol/L (ref 22–32)
Calcium: 9.1 mg/dL (ref 8.9–10.3)
Creatinine, Ser: 0.92 mg/dL (ref 0.44–1.00)
GFR calc non Af Amer: >60 mL/min (ref >60)
Glucose, Bld: 91 mg/dL (ref 70–99)
Potassium: 3.5 mmol/L (ref 3.5–5.1)
SODIUM: 142 mmol/L (ref 135–145)
Total Bilirubin: 3.1 mg/dL — ABNORMAL HIGH (ref 0.3–1.2)
Total Protein: 6.6 g/dL (ref 6.5–8.1)

## 2017-10-14 LAB — PROCALCITONIN: PROCALCITONIN: 0.23 ng/mL

## 2017-10-14 LAB — GLUCOSE, CAPILLARY
GLUCOSE-CAPILLARY: 75 mg/dL (ref 70–99)
GLUCOSE-CAPILLARY: 92 mg/dL (ref 70–99)
GLUCOSE-CAPILLARY: 89 mg/dL (ref 70–99)
Glucose-Capillary: 119 mg/dL — ABNORMAL HIGH (ref 70–99)
Glucose-Capillary: 147 mg/dL — ABNORMAL HIGH (ref 70–99)
Glucose-Capillary: 68 mg/dL — ABNORMAL LOW (ref 70–99)
Glucose-Capillary: 96 mg/dL (ref 70–99)

## 2017-10-14 LAB — LACTIC ACID, PLASMA: Lactic Acid, Venous: 1 mmol/L (ref 0.5–1.9)

## 2017-10-14 MED ORDER — VENLAFAXINE HCL ER 75 MG PO CP24
75.0000 mg | ORAL_CAPSULE | Freq: Every day | ORAL | Status: DC
  Administered 2017-10-14 – 2017-10-17 (×4): 75 mg via ORAL
  Filled 2017-10-14 (×4): qty 1

## 2017-10-14 MED ORDER — ACETAMINOPHEN 650 MG RE SUPP: 650.0000 mg | Freq: Four times a day (QID) | RECTAL | Status: DC | PRN

## 2017-10-14 MED ORDER — MOMETASONE FURO-FORMOTEROL FUM 200-5 MCG/ACT IN AERO
2.0000 | INHALATION_SPRAY | Freq: Two times a day (BID) | RESPIRATORY_TRACT | Status: DC
  Administered 2017-10-14 – 2017-10-17 (×7): 2 via RESPIRATORY_TRACT
  Filled 2017-10-14: qty 8.8

## 2017-10-14 MED ORDER — CARVEDILOL 12.5 MG PO TABS
12.5000 mg | ORAL_TABLET | Freq: Two times a day (BID) | ORAL | Status: DC
  Administered 2017-10-14 – 2017-10-17 (×8): 12.5 mg via ORAL
  Filled 2017-10-14 (×8): qty 1

## 2017-10-14 MED ORDER — ONDANSETRON HCL 4 MG PO TABS: 4.0000 mg | ORAL_TABLET | Freq: Four times a day (QID) | ORAL | Status: DC | PRN

## 2017-10-14 MED ORDER — POTASSIUM CHLORIDE ER 10 MEQ PO TBCR
10.0000 meq | EXTENDED_RELEASE_TABLET | Freq: Every day | ORAL | Status: DC
  Administered 2017-10-14 – 2017-10-17 (×4): 10 meq via ORAL
  Filled 2017-10-14 (×8): qty 1

## 2017-10-14 MED ORDER — PANTOPRAZOLE SODIUM 40 MG PO TBEC
40.0000 mg | DELAYED_RELEASE_TABLET | Freq: Every day | ORAL | Status: DC
  Administered 2017-10-14 – 2017-10-17 (×4): 40 mg via ORAL
  Filled 2017-10-14 (×4): qty 1

## 2017-10-14 MED ORDER — CLONAZEPAM 0.5 MG PO TABS
0.5000 mg | ORAL_TABLET | Freq: Every day | ORAL | Status: DC
  Administered 2017-10-14 – 2017-10-16 (×4): 0.5 mg via ORAL
  Filled 2017-10-14 (×4): qty 1

## 2017-10-14 MED ORDER — ONDANSETRON HCL 4 MG/2ML IJ SOLN: 4.0000 mg | Freq: Four times a day (QID) | INTRAMUSCULAR | Status: DC | PRN

## 2017-10-14 MED ORDER — RIVAROXABAN 20 MG PO TABS
20.0000 mg | ORAL_TABLET | Freq: Every day | ORAL | Status: DC
  Administered 2017-10-14 – 2017-10-16 (×3): 20 mg via ORAL
  Filled 2017-10-14 (×3): qty 1

## 2017-10-14 MED ORDER — DEXTROSE-NACL 5-0.9 % IV SOLN
INTRAVENOUS | Status: DC
  Administered 2017-10-14 – 2017-10-15 (×3): via INTRAVENOUS

## 2017-10-14 MED ORDER — INSULIN ASPART 100 UNIT/ML ~~LOC~~ SOLN
0.0000 [IU] | SUBCUTANEOUS | Status: DC
  Administered 2017-10-14 – 2017-10-16 (×6): 1 [IU] via SUBCUTANEOUS

## 2017-10-14 MED ORDER — TOPIRAMATE 25 MG PO TABS
50.0000 mg | ORAL_TABLET | Freq: Two times a day (BID) | ORAL | Status: DC
  Administered 2017-10-14 – 2017-10-17 (×8): 50 mg via ORAL
  Filled 2017-10-14 (×8): qty 2

## 2017-10-14 MED ORDER — LINAGLIPTIN 5 MG PO TABS
5.0000 mg | ORAL_TABLET | Freq: Every day | ORAL | Status: DC
  Administered 2017-10-14 – 2017-10-17 (×4): 5 mg via ORAL
  Filled 2017-10-14 (×4): qty 1

## 2017-10-14 MED ORDER — ACETAMINOPHEN 325 MG PO TABS
650.0000 mg | ORAL_TABLET | Freq: Four times a day (QID) | ORAL | Status: DC | PRN
  Administered 2017-10-14: 650 mg via ORAL
  Filled 2017-10-14: qty 2

## 2017-10-14 NOTE — Progress Notes (Signed)
Supervising Physician: Marybelle Killings  Patient Status:  [facility] - In-pt  Chief Complaint: Dark urine  Subjective: Patient s/p cholecystostomy tube placement 09/13/17 in radiology.  She was discharged home in improved condition and returned to radiology clinic 10/08/17 for routine follow-up.  At the time, it was noted she was doing well without symptoms.  Drain injection confirmed drain in place and cystic duct patent.  She was started on a capping trial.  Patient developed symptoms of nausea and fatigue 9/27 and called IR clinic 9/30 to report she was having dark urine.  She was referred to the ED for further evaluation.   Upon assessment today she is lying in bed, comfortable.  She states she has been having dark/orange urine, but overall is feeling better.  Is having some cough with phlegm.  States "this happens when I get an infection somewhere in my body." Tbili 3.1 WBC WNL Afebrile.   Her drain is still capped. Hungry and asking if she can eat.   Allergies: Belsomra [suvorexant]; Enalapril maleate; Latex; Nickel; Other; Iodine; Doxycycline; Hydrochlorothiazide; Hydroxychloroquine sulfate; Lyrica [pregabalin]; and Tape  Medications: Prior to Admission medications   Medication Sig Start Date End Date Taking? Authorizing Provider  acetaminophen (TYLENOL) 325 MG tablet Take 2 tablets (650 mg total) by mouth every 6 (six) hours as needed for mild pain, fever or headache. 09/19/17  Yes Debbe Odea, MD  augmented betamethasone dipropionate (DIPROLENE-AF) 0.05 % ointment Apply topically 2 (two) times daily. 09/30/17  Yes [provider]  budesonide-formoterol (SYMBICORT) 160-4.5 MCG/ACT inhaler Inhale 2 puffs into the lungs TWICE DAILY 120/4=30 06/10/17  Yes Rigoberto Noel, MD  carvedilol (COREG) 12.5 MG tablet Take 1 tablet (12.5 mg total) by mouth 2 (two) times daily with a meal. 06/12/17  Yes Plotnikov, Evie Lacks, MD  clonazePAM (KLONOPIN) 0.5 MG tablet Take 1-2 tablets (0.5-1 mg  total) by mouth at bedtime. Patient taking differently: Take 0.5 mg by mouth at bedtime.  08/04/17  Yes Plotnikov, Evie Lacks, MD  esomeprazole (NEXIUM) 40 MG capsule Take 1 capsule (40 mg total) by mouth daily before breakfast. 08/07/17  Yes Plotnikov, Evie Lacks, MD  JANUVIA 50 MG tablet Take 1 tablet (50 mg total) by mouth daily. Patient taking differently: Take 50 mg by mouth daily.  09/10/17  Yes Philemon Kingdom, MD  metFORMIN (GLUCOPHAGE-XR) 500 MG 24 hr tablet TAKE 2 TABLETS(1000 MG) BY MOUTH DAILY WITH BREAKFAST 06/12/17  Yes Plotnikov, Evie Lacks, MD  oxyCODONE (OXY IR/ROXICODONE) 5 MG immediate release tablet Take 1 tablet (5 mg total) by mouth every 4 (four) hours as needed for moderate pain. 09/19/17  Yes Debbe Odea, MD  polyethylene glycol (MIRALAX / GLYCOLAX) packet Take 17 g by mouth daily. 09/19/17  Yes Debbe Odea, MD  potassium chloride (K-DUR) 10 MEQ tablet Take 1 tablet (10 mEq total) by mouth daily. 09/24/17  Yes Plotnikov, Evie Lacks, MD  topiramate (TOPAMAX) 50 MG tablet Take 1 tablet (50 mg total) by mouth 2 (two) times daily. 06/12/17  Yes Plotnikov, Evie Lacks, MD  venlafaxine XR (EFFEXOR-XR) 75 MG 24 hr capsule Take 1 capsule (75 mg total) by mouth daily with breakfast. 03/04/17  Yes Plotnikov, Evie Lacks, MD  XARELTO 20 MG TABS tablet Take 1 tablet (20 mg total) by mouth at bedtime. 06/12/17  Yes Plotnikov, Evie Lacks, MD  Adalimumab (HUMIRA) 40 MG/0.8ML PSKT Inject 40 mg into the skin every 7 (seven) days.     [provider]  B-D ULTRA-FINE 33 LANCETS  MISC Use as instructed to test sugars 4 times daily DX: E11.8 09/22/17   Philemon Kingdom, MD  furosemide (LASIX) 40 MG tablet TAKE 1 TABLET BY MOUTH EVERY MORNING FOR EDEMA Patient taking differently: Take 20 mg by mouth daily as needed for fluid or edema.  01/10/17   Plotnikov, Evie Lacks, MD  gabapentin (NEURONTIN) 300 MG capsule Take 1 capsule (300 mg total) by mouth 2 (two) times daily as needed (for pain). 06/12/17    Plotnikov, Evie Lacks, MD  glucose blood (EZ SMART PLUS GLUCOSE TEST) test strip Use as instructed to test sugars 4 times daily DX: E11.8 09/22/17   Philemon Kingdom, MD  promethazine (PHENERGAN) 25 MG tablet Take 1 tablet (25 mg total) by mouth every 8 (eight) hours as needed for nausea or vomiting. Patient not taking: Reported on 10/06/2017 09/03/17   Plotnikov, Evie Lacks, MD  VENTOLIN HFA 108 (90 Base) MCG/ACT inhaler Inhale 2 puffs into the lungs every 6 (six) hours as needed for wheezing or shortness of breath. 200/8=25 Patient taking differently: Inhale 2 puffs into the lungs every 6 (six) hours as needed for wheezing or shortness of breath.  12/11/16   Plotnikov, Evie Lacks, MD  VOLTAREN 1 % GEL Apply 2 g topically 2 (two) times daily as needed (for pain). 100/4=25 Patient taking differently: Apply 4 g topically 4 (four) times daily as needed (pain).  04/29/17   Plotnikov, Evie Lacks, MD     Vital Signs: BP 128/70 (BP Location: Left Arm)   Pulse 74   Temp 97.8 F (36.6 C) (Oral)   Resp 17   Ht 5\' 7"  (1.702 m)   Wt 257 lb (116.6 kg)   SpO2 96%   BMI 40.25 kg/m   Physical Exam  NAD, alert, lying comfortably in bed.  No sceral icterus.  No jaundice Abdomen soft, non-distended.  Tender at drain insertion site.  Drain site I/c/d.  Capped.   Imaging: Ct Abdomen Pelvis W Contrast  Result Date: 10/13/2017 CLINICAL DATA:  Abdominal pain, recent cholecystitis, status post percutaneous cholecystostomy 09/12/2017 EXAM: CT ABDOMEN AND PELVIS WITH CONTRAST TECHNIQUE: Multidetector CT imaging of the abdomen and pelvis was performed using the standard protocol following bolus administration of intravenous contrast. CONTRAST:  165mL ISOVUE-300 IOPAMIDOL (ISOVUE-300) INJECTION 61% COMPARISON:  09/12/2017 FINDINGS: Lower chest: Similar bibasilar nodular scarring/atelectasis. No pleural or pericardial effusion. Normal heart size for age. Degenerative changes of the thoracic spine on the right.  Hepatobiliary: Mild hypoattenuation of the liver suggesting hepatic steatosis. No focal hepatic abnormality or biliary obstruction. No surrounding perihepatic free fluid, hemorrhage, or subcapsular hematoma. Interval right upper quadrant transhepatic cholecystostomy within the gallbladder. There is interval improvement in the gallbladder distension and surrounding pericholecystic fluid inflammation. Gallbladder is decompressed. Common bile duct nondilated. Pancreas: Unremarkable. No pancreatic ductal dilatation or surrounding inflammatory changes. Spleen: Normal in size without focal abnormality. Adrenals/Urinary Tract: Normal adrenal glands. Normal symmetric renal enhancement and excretion. 12 mm right renal cortical exophytic cyst as before. Ureters are symmetric and decompressed. No obstructing ureteral calculus. Bladder is underdistended. Stomach/Bowel: Negative for bowel obstruction, significant dilatation, ileus, or free air. Scattered colonic diverticulosis. Remote appendectomy noted. No fluid collection or abscess. No ascites. Vascular/Lymphatic: Aortic atherosclerosis. Negative for aneurysm or occlusive process. Mesenteric and renal vasculature appear patent. No adenopathy. Reproductive: Uterus and adnexa normal in size. Uterus is retroverted. No pelvic fluid collection, hemorrhage, abscess or hematoma. Other: No abdominal wall hernia or abnormality. No abdominopelvic ascites. Musculoskeletal: Degenerative changes of the spine. No acute  osseous finding. Lower lumbar facet arthropathy noted IMPRESSION: Status post interval percutaneous transhepatic cholecystostomy. Cholecystostomy catheter in good position within the gallbladder. There is improvement in the gallbladder distention, pericholecystic fluid and inflammation compatible with resolving cholecystitis. No biliary dilatation or obstruction Mild hepatic steatosis No fluid collection or abscess Other chronic findings as above Electronically Signed   By:  Jerilynn Mages.  Shick M.D.   On: 10/13/2017 22:23   US Abdomen Limited Ruq  Result Date: 10/14/2017 CLINICAL DATA:  Right upper quadrant pain, status post cholecystostomy EXAM: ULTRASOUND ABDOMEN LIMITED RIGHT UPPER QUADRANT COMPARISON:  CT abdomen/pelvis dated 10/13/2017 FINDINGS: Gallbladder: Mild wall thickening with layering sludge and indwelling cholecystostomy tube. No definite pericholecystic fluid. Positive sonographic Murphy's sign. Common bile duct: Diameter: 6 mm Liver: Hyperechoic hepatic parenchyma with coarse hepatic echotexture. No focal hepatic lesion is seen. Portal vein is patent on color Doppler imaging with normal direction of blood flow towards the liver. IMPRESSION: Cholecystostomy tube in satisfactory position. Associated gallbladder wall thickening with layering sludge. Hyperechoic hepatic parenchyma with coarse hepatic echotexture, suggesting hepatic steatosis. Electronically Signed   By: Julian Hy M.D.   On: 10/14/2017 01:18    Labs:  CBC: Recent Labs    09/17/17 0509 09/18/17 0517 09/24/17 1520 10/13/17 1509  WBC 6.4 6.5 7.3 7.0  HGB 8.7* 9.1* 11.0* 10.5*  HCT 29.3* 30.6* 35.1* 33.7*  PLT 549* 556* 339.0 253    COAGS: Recent Labs    06/26/17 0751 09/13/17 0413 09/13/17 1211 09/13/17 2150 09/14/17 0841 10/13/17 2012  INR 0.93  --   --   --   --  1.34  APTT  --  76* 75* 67* 96*  --     BMP: Recent Labs    09/18/17 0517 09/19/17 0459 09/24/17 1520 10/13/17 1509 10/14/17 0450  NA 146*  --  140 145 142  K 3.2*  --  3.8 4.1 3.5  CL 109  --  108 111 111  CO2 28  --  25 23 23   GLUCOSE 86  --  97 96 91  BUN 5*  --  7 14 13   CALCIUM 8.2*  --  8.8 9.5 9.1  CREATININE 1.15* 1.17* 1.15 0.96 0.92  GFRNONAA 52* 51*  --  >60 >60  GFRAA >60 59*  --  >60 >60    LIVER FUNCTION TESTS: Recent Labs    09/17/17 0509 09/24/17 1520 10/13/17 1509 10/14/17 0450  BILITOT 0.5 0.5 3.3* 3.1*  AST 17 40* 190* 230*  ALT 26 25 116* 126*  ALKPHOS 125 296* 305*  295*  PROT 6.2* 7.1 7.3 6.6  ALBUMIN 2.0* 3.2* 2.9* 2.7*    Assessment and Plan: Cholecystitis s/p cholecystostomy tube placement 8/31.  Patient recently started capping trial 9/25.  Unfortunately, she has not been able to tolerate.   Connected back to drainage bag with immediate return of lightly colored bilious output.  Patient also has a large phlegm at bedside she has coughed up.  Plan to leave drain to gravity drainage.  If symptoms and labs improve, would plan to discharge home with gravity bag once stable.   IR to follow.   Electronically Signed: Docia Barrier, PA 10/14/2017, 3:10 PM   I spent a total of 15 Minutes at the the patient's bedside AND on the patient's hospital floor or unit, greater than 50% of which was counseling/coordinating care for cholecystitis.

## 2017-10-14 NOTE — ED Notes (Signed)
ED TO INPATIENT HANDOFF REPORT  Name/Age/Gender [name] 57 y.o. female  Code Status Code Status History    Date Active Date Inactive Code Status Order ID Comments User Context   09/12/2017 2112 09/19/2017 1854 Full Code [id number]  Mercy Riding, MD Inpatient   05/23/2017 2025 05/26/2017 1616 Full Code [id number]  Rise Patience, MD ED   07/21/2016 1011 07/23/2016 1811 Full Code [id number]  Lavina Hamman, MD Inpatient   08/17/2015 2029 08/19/2015 1827 Full Code [id number]  Juanito Doom, MD Inpatient   08/17/2015 1807 08/17/2015 2029 Full Code [id number]  Raylene Miyamoto, MD ED   07/13/2015 1747 07/16/2015 1631 Full Code [id number]  Meredith Pel, MD Inpatient   11/15/2014 1815 11/18/2014 1755 Full Code [id number]  Meredith Pel, MD Inpatient    Advance Directive Documentation     Most Recent Value  Type of Advance Directive  Healthcare Power of Attorney  Pre-existing out of facility DNR order (yellow form or pink MOST form)  -  "MOST" Form in Place?  -      Home/SNF/Other Home  Chief Complaint Abd Pain; Diarrhea  Level of Care/Admitting Diagnosis ED Disposition    ED Disposition Condition [facility] [100102]  Level of Care: Med-Surg [16]  Diagnosis: Abdominal pain [144315]  Admitting Physician: Bethena Roys [[id number]]  Attending Physician: Bethena Roys [phone]  PT Class (Do Not Modify): Observation [104]  PT Acc Code (Do Not Modify): Observation [10022]       Medical History Past Medical History:  Diagnosis Date  . Allergic rhinitis   . ALLERGIC RHINITIS 10/26/2009  . Anxiety   . ANXIETY 08/14/2006  . B12 DEFICIENCY 08/25/2007  . Complication of anesthesia    pt has had difficulty following anesthesia with her knee in 2016-unable to care for herself afterward  . Confusion   . Depression    takes Lexapro daily  . Diabetes mellitus without complication ([place])    was on insulin but  has been off since Nov 2015 and now only takes Metformin daily  . DYSPNEA 04/28/2009   with exertion  . Esophageal reflux    takes Nexium daily  . Fibromyalgia   . Headache    last migraine 2-25yr ago;takes Topamax daily  . History of shingles   . Hypertension    takes Coreg daily  . Insomnia    takes Nortriptyline nightly   . Joint pain   . Joint swelling   . Left knee pain   . Lichen planus   . Long-term memory loss   . Nocturia   . OSA (obstructive sleep apnea)    doesn't use CPAP;sleep study in epic from 2006  . Osteoarthritis   . Osteoarthritis   . Pneumonia    over 30 yrs ago  . Protein calorie malnutrition ([place]   . Rheumatoid [place] ([place]   . Sarcoidosis    Dr. ZLolita Patella . Short-term memory loss   . Shortness of breath   . TIA (transient ischemic attack)   . Unsteady gait   . Urinary urgency   . Vitamin D deficiency    is supposed to take Vit D but can't afford it  . VITAMIN D DEFICIENCY 08/25/2007    Allergies Allergies  Allergen Reactions  . Belsomra [Suvorexant] Other (See Comments)    FGolden Circleout of bed while asleep: "I'm waking up as I'm falling on the floor;" "Night terrors"  .  Enalapril Maleate Cough  . Latex Hives  . Nickel Other (See Comments)    Blisters  Pt has a titanium right and left knee - nickel causes skin irritations that form into blisters and sores  . Other Other (See Comments)    Patient has Sarcoidosis and can't tolerate any metals  . Iodine Other (See Comments)    Patient received IV contrast without pre-meds without any problems 09/12/17 kwl Mother was intolerant--CODED on CT table---pt never tried  . Doxycycline Diarrhea and Nausea And Vomiting  . Hydrochlorothiazide Other (See Comments)    Low potassium levels   . Hydroxychloroquine Sulfate Other (See Comments)    Vision changes   . Lyrica [Pregabalin] Other (See Comments)    "Made me feel high"  . Tape Other (See Comments)    Medical tape causes bruising    IV  Location/Drains/Wounds Patient Lines/Drains/Airways Status   Active Line/Drains/Airways    Name:   Placement date:   Placement time:   Site:   Days:   Peripheral IV 10/13/17 Right Antecubital   10/13/17    2116    Antecubital   1   Closed System Drain 1 Right Abdomen Other (Comment)   09/13/17    1400    Abdomen   31   Ureteral Drain/Stent    06/26/17    1044    -   110          Labs/Imaging Results for orders placed or performed during the hospital encounter of 10/13/17 (from the past 48 hour(s))  Lipase, blood     Status: None   Collection Time: 10/13/17  3:09 PM  Result Value Ref Range   Lipase 33 11 - 51 U/L    Comment: Performed at [facility], [place] [address], [place], [place] [address]  Comprehensive metabolic panel     Status: Abnormal   Collection Time: 10/13/17  3:09 PM  Result Value Ref Range   Sodium 145 135 - 145 mmol/L   Potassium 4.1 3.5 - 5.1 mmol/L   Chloride 111 98 - 111 mmol/L   CO2 23 22 - 32 mmol/L   Glucose, Bld 96 70 - 99 mg/dL   BUN 14 6 - 20 mg/dL   Creatinine, Ser 0.96 0.44 - 1.00 mg/dL   Calcium 9.5 8.9 - 10.3 mg/dL   Total Protein 7.3 6.5 - 8.1 g/dL   Albumin 2.9 (L) 3.5 - 5.0 g/dL   AST 190 (H) 15 - 41 U/L   ALT 116 (H) 0 - 44 U/L   Alkaline Phosphatase 305 (H) 38 - 126 U/L   Total Bilirubin 3.3 (H) 0.3 - 1.2 mg/dL   GFR calc non Af Amer >60 >60 mL/min   GFR calc Af Amer >60 >60 mL/min    Comment: (NOTE) The eGFR has been calculated using the CKD EPI equation. This calculation has not been validated in all clinical situations. eGFR's persistently <60 mL/min signify possible Chronic Kidney Disease.    Anion gap 11 5 - 15    Comment: Performed at [facility], [place] [address] [place], [place] [address]  CBC     Status: Abnormal   Collection Time: 10/13/17  3:09 PM  Result Value Ref Range   WBC 7.0 4.0 - 10.5 K/uL   RBC 3.70 (L) 3.87 - 5.11 MIL/uL   Hemoglobin 10.5 (L) 12.0 - 15.0 g/dL   HCT 33.7 (L)  36.0 - 46.0 %   MCV 91.1 78.0 - 100.0  fL   MCH 28.4 26.0 - 34.0 pg   MCHC 31.2 30.0 - 36.0 g/dL   RDW 17.3 (H) 11.5 - 15.5 %   Platelets 253 150 - 400 K/uL    Comment: Performed at [facility], [place] [address], [place], [place] [address]  Differential     Status: None   Collection Time: 10/13/17  3:09 PM  Result Value Ref Range   Neutrophils Relative % 46 %   Neutro Abs 3.2 1.7 - 7.7 K/uL   Lymphocytes Relative 42 %   Lymphs Abs 2.9 0.7 - 4.0 K/uL   Monocytes Relative 8 %   Monocytes Absolute 0.6 0.1 - 1.0 K/uL   Eosinophils Relative 4 %   Eosinophils Absolute 0.3 0.0 - 0.7 K/uL   Basophils Relative 0 %   Basophils Absolute 0.0 0.0 - 0.1 K/uL    Comment: Performed at [facility], [place] [address], [place], [place] [address]  I-Stat beta hCG blood, ED     Status: Abnormal   Collection Time: 10/13/17  3:25 PM  Result Value Ref Range   I-stat hCG, quantitative 5.8 (H) <5 mIU/mL   Comment 3            Comment:   GEST. AGE      CONC.  (mIU/mL)   <=1 WEEK        5 - 50     2 WEEKS       50 - 500     3 WEEKS       100 - 10,000     4 WEEKS     1,000 - 30,000        FEMALE AND NON-PREGNANT FEMALE:     LESS THAN 5 mIU/mL   Protime-INR     Status: Abnormal   Collection Time: 10/13/17  8:12 PM  Result Value Ref Range   Prothrombin Time 16.5 (H) 11.4 - 15.2 seconds   INR 1.34     Comment: Performed at [facility], [place] [address], [place], [place] [address]  Urinalysis, Routine w reflex microscopic     Status: Abnormal   Collection Time: 10/13/17  9:01 PM  Result Value Ref Range   Color, Urine AMBER (A) YELLOW    Comment: BIOCHEMICALS MAY BE AFFECTED BY COLOR   APPearance CLOUDY (A) CLEAR   Specific Gravity, Urine 1.018 1.005 - 1.030   pH 7.0 5.0 - 8.0   Glucose, UA NEGATIVE NEGATIVE mg/dL   Hgb urine dipstick SMALL (A) NEGATIVE   Bilirubin Urine SMALL (A) NEGATIVE   Ketones, ur 5 (A) NEGATIVE mg/dL   Protein, ur 30 (A)  NEGATIVE mg/dL   Nitrite NEGATIVE NEGATIVE   Leukocytes, UA MODERATE (A) NEGATIVE   RBC / HPF 11-20 0 - 5 RBC/hpf   WBC, UA >50 (H) 0 - 5 WBC/hpf   Bacteria, UA RARE (A) NONE SEEN   Squamous Epithelial / LPF 0-5 0 - 5   WBC Clumps PRESENT    Mucus PRESENT    Triple Phosphate Crystal PRESENT     Comment: Performed at [facility], [place] [address], [place],  [address]   Ct Abdomen Pelvis W Contrast  Result Date: 10/13/2017 CLINICAL DATA:  Abdominal pain, recent cholecystitis, status post percutaneous cholecystostomy 09/12/2017 EXAM: CT ABDOMEN AND PELVIS WITH CONTRAST TECHNIQUE: Multidetector CT imaging of the abdomen and pelvis was performed using the standard protocol following bolus administration of intravenous contrast. CONTRAST:  160m ISOVUE-300  IOPAMIDOL (ISOVUE-300) INJECTION 61% COMPARISON:  09/12/2017 FINDINGS: Lower chest: Similar bibasilar nodular scarring/atelectasis. No pleural or pericardial effusion. Normal heart size for age. Degenerative changes of the thoracic spine on the right. Hepatobiliary: Mild hypoattenuation of the liver suggesting hepatic steatosis. No focal hepatic abnormality or biliary obstruction. No surrounding perihepatic free fluid, hemorrhage, or subcapsular hematoma. Interval right upper quadrant transhepatic cholecystostomy within the gallbladder. There is interval improvement in the gallbladder distension and surrounding pericholecystic fluid inflammation. Gallbladder is decompressed. Common bile duct nondilated. Pancreas: Unremarkable. No pancreatic ductal dilatation or surrounding inflammatory changes. Spleen: Normal in size without focal abnormality. Adrenals/Urinary Tract: Normal adrenal glands. Normal symmetric renal enhancement and excretion. 12 mm right renal cortical exophytic cyst as before. Ureters are symmetric and decompressed. No obstructing ureteral calculus. Bladder is underdistended. Stomach/Bowel: Negative for bowel  obstruction, significant dilatation, ileus, or free air. Scattered colonic diverticulosis. Remote appendectomy noted. No fluid collection or abscess. No ascites. Vascular/Lymphatic: Aortic atherosclerosis. Negative for aneurysm or occlusive process. Mesenteric and renal vasculature appear patent. No adenopathy. Reproductive: Uterus and adnexa normal in size. Uterus is retroverted. No pelvic fluid collection, hemorrhage, abscess or hematoma. Other: No abdominal wall hernia or abnormality. No abdominopelvic ascites. Musculoskeletal: Degenerative changes of the spine. No acute osseous finding. Lower lumbar facet arthropathy noted IMPRESSION: Status post interval percutaneous transhepatic cholecystostomy. Cholecystostomy catheter in good position within the gallbladder. There is improvement in the gallbladder distention, pericholecystic fluid and inflammation compatible with resolving cholecystitis. No biliary dilatation or obstruction Mild hepatic steatosis No fluid collection or abscess Other chronic findings as above Electronically Signed   By: Jerilynn Mages.  Shick M.D.   On: 10/13/2017 22:23   US Abdomen Limited Ruq  Result Date: 10/14/2017 CLINICAL DATA:  Right upper quadrant pain, status post cholecystostomy EXAM: ULTRASOUND ABDOMEN LIMITED RIGHT UPPER QUADRANT COMPARISON:  CT abdomen/pelvis dated 10/13/2017 FINDINGS: Gallbladder: Mild wall thickening with layering sludge and indwelling cholecystostomy tube. No definite pericholecystic fluid. Positive sonographic Murphy's sign. Common bile duct: Diameter: 6 mm Liver: Hyperechoic hepatic parenchyma with coarse hepatic echotexture. No focal hepatic lesion is seen. Portal vein is patent on color Doppler imaging with normal direction of blood flow towards the liver. IMPRESSION: Cholecystostomy tube in satisfactory position. Associated gallbladder wall thickening with layering sludge. Hyperechoic hepatic parenchyma with coarse hepatic echotexture, suggesting hepatic  steatosis. Electronically Signed   By: Julian Hy M.D.   On: 10/14/2017 01:18    Pending Labs Unresulted Labs (From admission, onward)    Start     Ordered   10/13/17 2331  Urine culture  Add-on,   STAT    Question:  Patient immune status  Answer:  Immunocompromised   10/13/17 2330   Signed and Held  Comprehensive metabolic panel  Tomorrow morning,   R     Signed and Held          Vitals/Pain Today's Vitals   10/13/17 1923 10/13/17 1939 10/13/17 2043 10/13/17 2141  BP:  117/71  138/76  Pulse:  66  76  Resp:  19  18  Temp:   (!) 96.8 F (36 C)   TempSrc:   Rectal   SpO2:  100%  99%  Weight:      Height:      PainSc: 8        Isolation Precautions No active isolations  Medications Medications  ketorolac (TORADOL) 15 MG/ML injection 15 mg (15 mg Intravenous Given 10/13/17 2115)  sodium chloride 0.9 % bolus 1,000 mL (0 mLs Intravenous Stopped 10/14/17 0121)  iopamidol (ISOVUE-300) 61 % injection 100 mL (100 mLs Intravenous Contrast Given 10/13/17 2146)    Mobility walks

## 2017-10-14 NOTE — Consult Note (Addendum)
Referring Provider: [facility]   Primary Care Physician:  Cassandria Anger, MD Primary Gastroenterologist:   Althia Forts.   Reason for Consultation:  Abdominal pain / abnormal liver tests    ASSESSMENT AND PLAN:    57 yo female with recent acute cholecystitis. Poor surgical candidate, s/p cholecystostomy tube placement late August. Drain capped 9/25. Admitted now with abdominal pain, worsening liver tests. CT scan shows good drain placement, resolving cholecystitis.  -Due to timing of symptoms (following drain capping) and rising liver tests will ask IR to evaluate tube. If negative evaluation then will need to evaluate for other etiologies. Liver tests could be abnormal due to bactrim but that wouldn't explain associated abdominal pain.    HPI: [name] is a 57 y.o. 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  Patient was hospitalized late August through 09/19/2017 for sepsis / acute cholecystitis with small adjacent abscesses.  Surgery evaluated, felt she was not a good candidate for surgery at the time.  Interventional radiology subsequently placed cholecystostomy .  Biliary fluid was positive for staph aureus. Infectious Disease recommended Bactrim DS twice daily for 2 weeks upon discharge.  Patient saw ID in the office 10/06/2017 was given clearance to restart Humira and has taken one dose. She held yesterday's dose concerned about "infection" in drain  Patient had her initial follow-up cholangiogram with IR on 10/08/2017, tube was capped.  Patient called IR 10/13/2017 with complaints of lethargy, decreased appetite and dark urine.  She has been having subjective fevers and sharp RUQ pain.  Her T bilirubin increased from 0.5 mid September to 3.3.  Alk phos 305, about the same as in mid September.  Her transaminases have jumped significantly.  White count normal.  CT scan with contrast shows  solving cholecystitis. She hasn't eaten much over last few days, appetite is poor. No significant nausea. No bowel changes   Past Medical History:  Diagnosis Date  . Allergic rhinitis   . ALLERGIC RHINITIS 10/26/2009  . Anxiety   . ANXIETY 08/14/2006  . B12 DEFICIENCY 08/25/2007  . Complication of anesthesia    pt has had difficulty following anesthesia with her knee in 2016-unable to care for herself afterward  . Confusion   . Depression    takes Lexapro daily  . Diabetes mellitus without complication ([place])    was on insulin but has been off since Nov 2015 and now only takes Metformin daily  . DYSPNEA 04/28/2009   with exertion  . Esophageal reflux    takes Nexium daily  . Fibromyalgia   . Headache    last migraine 2-22yr ago;takes Topamax daily  . History of shingles   . Hypertension    takes Coreg daily  . Insomnia    takes Nortriptyline nightly   . Joint pain   . Joint swelling   . Left knee pain   . Lichen planus   . Long-term memory loss   . Nocturia   . OSA (obstructive sleep apnea)    doesn't use CPAP;sleep study in epic from 2006  . Osteoarthritis   . Osteoarthritis   . Pneumonia    over 30 yrs ago  . Protein calorie malnutrition ([place]   . [place]umatoid arthritis ([place]   . Sarcoidosis    Dr. ZLolita Patella . Short-term memory loss   . Shortness of breath   . TIA (transient ischemic attack)   . Unsteady gait   .  Urinary urgency   . Vitamin D deficiency    is supposed to take Vit D but can't afford it  . VITAMIN D DEFICIENCY 08/25/2007    Past Surgical History:  Procedure Laterality Date  . APPENDECTOMY    . arthroscopic knee surgery Right 11-12-04  . AXILLARY ABCESS IRRIGATION AND DEBRIDEMENT  Jul & Aug2012  . CARPAL TUNNEL RELEASE Left 05/23/2014   Procedure: CARPAL TUNNEL RELEASE;  Surgeon: Meredith Pel, MD;  Location: [place];  Service: Orthopedics;  Laterality: Left;  . cyst removed from top of buttocks  at age 75  . ENDOMETRIAL ABLATION    . IR PERC  CHOLECYSTOSTOMY  09/13/2017  . IR RADIOLOGIST EVAL & MGMT  10/08/2017  . LACRIMAL DUCT EXPLORATION Right 06/26/2017   Procedure: LACRIMAL DUCT EXPLORATION AND ETHMOIDECTOMY;  Surgeon: Clista Bernhardt, MD;  Location: [place];  Service: Ophthalmology;  Laterality: Right;  . TEAR DUCT PROBING Right 06/26/2017   Procedure: TEAR DUCT PROBING WITH STENT;  Surgeon: Clista Bernhardt, MD;  Location: [place];  Service: Ophthalmology;  Laterality: Right;  . TOTAL KNEE ARTHROPLASTY Right 11/15/2014   Procedure: TOTAL RIGHT KNEE ARTHROPLASTY;  Surgeon: Meredith Pel, MD;  Location: [place];  Service: Orthopedics;  Laterality: Right;  . TOTAL KNEE ARTHROPLASTY Left 07/13/2015   Procedure: LEFT TOTAL KNEE ARTHROPLASTY;  Surgeon: Meredith Pel, MD;  Location: [place];  Service: Orthopedics;  Laterality: Left;    Prior to Admission medications   Medication Sig Start Date End Date Taking? Authorizing Provider  acetaminophen (TYLENOL) 325 MG tablet Take 2 tablets (650 mg total) by mouth every 6 (six) hours as needed for mild pain, fever or headache. 09/19/17  Yes Debbe Odea, MD  augmented betamethasone dipropionate (DIPROLENE-AF) 0.05 % ointment Apply topically 2 (two) times daily. 09/30/17  Yes [provider]  budesonide-formoterol (SYMBICORT) 160-4.5 MCG/ACT inhaler Inhale 2 puffs into the lungs TWICE DAILY 120/4=30 06/10/17  Yes Rigoberto Noel, MD  carvedilol (COREG) 12.5 MG tablet Take 1 tablet (12.5 mg total) by mouth 2 (two) times daily with a meal. 06/12/17  Yes Plotnikov, Evie Lacks, MD  clonazePAM (KLONOPIN) 0.5 MG tablet Take 1-2 tablets (0.5-1 mg total) by mouth at bedtime. Patient taking differently: Take 0.5 mg by mouth at bedtime.  08/04/17  Yes Plotnikov, Evie Lacks, MD  esomeprazole (NEXIUM) 40 MG capsule Take 1 capsule (40 mg total) by mouth daily before breakfast. 08/07/17  Yes Plotnikov, Evie Lacks, MD  JANUVIA 50 MG tablet Take 1 tablet (50 mg total) by mouth daily. Patient taking  differently: Take 50 mg by mouth daily.  09/10/17  Yes Philemon Kingdom, MD  metFORMIN (GLUCOPHAGE-XR) 500 MG 24 hr tablet TAKE 2 TABLETS(1000 MG) BY MOUTH DAILY WITH BREAKFAST 06/12/17  Yes Plotnikov, Evie Lacks, MD  oxyCODONE (OXY IR/ROXICODONE) 5 MG immediate release tablet Take 1 tablet (5 mg total) by mouth every 4 (four) hours as needed for moderate pain. 09/19/17  Yes Debbe Odea, MD  polyethylene glycol (MIRALAX / GLYCOLAX) packet Take 17 g by mouth daily. 09/19/17  Yes Debbe Odea, MD  potassium chloride (K-DUR) 10 MEQ tablet Take 1 tablet (10 mEq total) by mouth daily. 09/24/17  Yes Plotnikov, Evie Lacks, MD  topiramate (TOPAMAX) 50 MG tablet Take 1 tablet (50 mg total) by mouth 2 (two) times daily. 06/12/17  Yes Plotnikov, Evie Lacks, MD  venlafaxine XR (EFFEXOR-XR) 75 MG 24 hr capsule Take 1 capsule (75 mg total) by mouth daily with breakfast. 03/04/17  Yes Plotnikov, Evie Lacks, MD  XARELTO 20 MG TABS tablet Take 1 tablet (20 mg total) by mouth at bedtime. 06/12/17  Yes Plotnikov, Evie Lacks, MD  Adalimumab (HUMIRA) 40 MG/0.8ML PSKT Inject 40 mg into the skin every 7 (seven) days.     [provider]  B-D ULTRA-FINE 33 LANCETS MISC Use as instructed to test sugars 4 times daily DX: E11.8 09/22/17   Philemon Kingdom, MD  furosemide (LASIX) 40 MG tablet TAKE 1 TABLET BY MOUTH EVERY MORNING FOR EDEMA Patient taking differently: Take 20 mg by mouth daily as needed for fluid or edema.  01/10/17   Plotnikov, Evie Lacks, MD  gabapentin (NEURONTIN) 300 MG capsule Take 1 capsule (300 mg total) by mouth 2 (two) times daily as needed (for pain). 06/12/17   Plotnikov, Evie Lacks, MD  glucose blood (EZ SMART PLUS GLUCOSE TEST) test strip Use as instructed to test sugars 4 times daily DX: E11.8 09/22/17   Philemon Kingdom, MD  promethazine (PHENERGAN) 25 MG tablet Take 1 tablet (25 mg total) by mouth every 8 (eight) hours as needed for nausea or vomiting. Patient not taking: Reported on 10/06/2017 09/03/17    Plotnikov, Evie Lacks, MD  VENTOLIN HFA 108 (90 Base) MCG/ACT inhaler Inhale 2 puffs into the lungs every 6 (six) hours as needed for wheezing or shortness of breath. 200/8=25 Patient taking differently: Inhale 2 puffs into the lungs every 6 (six) hours as needed for wheezing or shortness of breath.  12/11/16   Plotnikov, Evie Lacks, MD  VOLTAREN 1 % GEL Apply 2 g topically 2 (two) times daily as needed (for pain). 100/4=25 Patient taking differently: Apply 4 g topically 4 (four) times daily as needed (pain).  04/29/17   Plotnikov, Evie Lacks, MD    Current Facility-Administered Medications  Medication Dose Route Frequency Provider Last Rate Last Dose  . acetaminophen (TYLENOL) tablet 650 mg  650 mg Oral Q6H PRN Emokpae, Ejiroghene E, MD   650 mg at 10/14/17 1016   Or  . acetaminophen (TYLENOL) suppository 650 mg  650 mg Rectal Q6H PRN Emokpae, Ejiroghene E, MD      . carvedilol (COREG) tablet 12.5 mg  12.5 mg Oral BID WC Emokpae, Ejiroghene E, MD   12.5 mg at 10/14/17 0809  . clonazePAM (KLONOPIN) tablet 0.5 mg  0.5 mg Oral QHS Emokpae, Ejiroghene E, MD   0.5 mg at 10/14/17 0245  . dextrose 5 %-0.9 % sodium chloride infusion   Intravenous Continuous Gardiner Barefoot, NP 75 mL/hr at 10/14/17 0247    . insulin aspart (novoLOG) injection 0-9 Units  0-9 Units Subcutaneous Q4H Emokpae, Ejiroghene E, MD      . linagliptin (TRADJENTA) tablet 5 mg  5 mg Oral Daily Emokpae, Ejiroghene E, MD   5 mg at 10/14/17 1009  . mometasone-formoterol (DULERA) 200-5 MCG/ACT inhaler 2 puff  2 puff Inhalation BID Emokpae, Ejiroghene E, MD   2 puff at 10/14/17 0826  . ondansetron (ZOFRAN) tablet 4 mg  4 mg Oral Q6H PRN Emokpae, Ejiroghene E, MD       Or  . ondansetron (ZOFRAN) injection 4 mg  4 mg Intravenous Q6H PRN Emokpae, Ejiroghene E, MD      . pantoprazole (PROTONIX) EC tablet 40 mg  40 mg Oral Daily Emokpae, Ejiroghene E, MD   40 mg at 10/14/17 1010  . potassium chloride (K-DUR) CR tablet 10 mEq  10 mEq Oral  Daily Emokpae, Ejiroghene E, MD   10 mEq at 10/14/17  1010  . topiramate (TOPAMAX) tablet 50 mg  50 mg Oral BID Emokpae, Ejiroghene E, MD   50 mg at 10/14/17 1010  . venlafaxine XR (EFFEXOR-XR) 24 hr capsule 75 mg  75 mg Oral Q breakfast Emokpae, Ejiroghene E, MD   75 mg at 10/14/17 0809    Allergies as of 10/13/2017 - Review Complete 10/13/2017  Allergen Reaction Noted  . Belsomra [suvorexant] Other (See Comments) 01/21/2017  . Enalapril maleate Cough 02/02/2007  . Latex Hives 05/11/2012  . Nickel Other (See Comments) 07/03/2015  . Other Other (See Comments) 05/23/2017  . Iodine Other (See Comments) 05/11/2012  . Doxycycline Diarrhea and Nausea And Vomiting 05/23/2017  . Hydrochlorothiazide Other (See Comments) 02/02/2007  . Hydroxychloroquine sulfate Other (See Comments) 07/10/2007  . Lyrica [pregabalin] Other (See Comments) 05/23/2017  . Tape Other (See Comments) 05/23/2017    Family History  Problem Relation Age of Onset  . Heart disease Mother   . Kidney disease Mother   . Cancer Father        leukemia  . Hypertension Unknown   . Coronary artery disease Unknown        female 1st degree relative <60  . Heart failure Unknown        congestive  . Coronary artery disease Unknown        Female 1st degree relative <50  . Breast cancer Unknown        1st degree relative <50 S  . Breast cancer Sister     Social History   Socioeconomic History  . Marital status: Single    Spouse name: Not on file  . Number of children: Not on file  . Years of education: Not on file  . Highest education level: Not on file  Occupational History  . Occupation: disabled  Social Needs  . Financial resource strain: Not on file  . Food insecurity:    Worry: Not on file    Inability: Not on file  . Transportation needs:    Medical: Not on file    Non-medical: Not on file  Tobacco Use  . Smoking status: Former Smoker    Packs/day: 0.50    Years: 10.00    Pack years: 5.00    Types:  Cigarettes  . Smokeless tobacco: Never Used  . Tobacco comment: quit smoking in 2004  Substance and Sexual Activity  . Alcohol use: No  . Drug use: No  . Sexual activity: Not Currently  Lifestyle  . Physical activity:    Days per week: Not on file    Minutes per session: Not on file  . Stress: Not on file  Relationships  . Social connections:    Talks on phone: Not on file    Gets together: Not on file    Attends religious service: Not on file    Active member of club or organization: Not on file    Attends meetings of clubs or organizations: Not on file    Relationship status: Not on file  . Intimate partner violence:    Fear of current or ex partner: Not on file    Emotionally abused: Not on file    Physically abused: Not on file    Forced sexual activity: Not on file  Other Topics Concern  . Not on file  Social History Narrative   Single, broke up with partner in 2008.    Review of Systems: All systems reviewed and negative except where noted in HPI.  Physical Exam:  Vital signs in last 24 hours: Temp:  [96.8 F (36 C)-98.4 F (36.9 C)] 97.8 F (36.6 C) (10/01 1110) Pulse Rate:  [66-76] 74 (10/01 0910) Resp:  [17-20] 17 (10/01 0910) BP: (117-144)/(70-78) 128/70 (10/01 0910) SpO2:  [96 %-100 %] 96 % (10/01 0910) Weight:  [116.6 kg] 116.6 kg (09/30 1458) Last BM Date: 10/13/17 General:   Alert, obese, female in NAD Psych:  Pleasant, cooperative. Normal mood and affect. Eyes:  Pupils equal, sclera clear, no icterus.   Conjunctiva pink. Ears:  Normal auditory acuity. Nose:  No deformity, discharge,  or lesions. Neck:  Supple; no masses Lungs:  Clear throughout to auscultation.   No wheezes, crackles, or rhonchi.  Heart:  Regular rate and rhythm; no murmurs, no edema Abdomen:  Soft, non-distended,+ tenderness in RUQ, especially near drain. BS active, no palp mass    Rectal:  Deferred  Msk:  Symmetrical without gross deformities. . Neurologic:  Alert and  oriented  x4;  grossly normal neurologically. Skin:  Intact without significant lesions or rashes..   Intake/Output from previous day: 09/30 0701 - 10/01 0700 In: 202.9 [I.V.:202.9] Out: -  Intake/Output this shift: Total I/O In: -  Out: 100 [Urine:100]  Lab Results: Recent Labs    10/13/17 1509  WBC 7.0  HGB 10.5*  HCT 33.7*  PLT 253   BMET Recent Labs    10/13/17 1509 10/14/17 0450  NA 145 142  K 4.1 3.5  CL 111 111  CO2 23 23  GLUCOSE 96 91  BUN 14 13  CREATININE 0.96 0.92  CALCIUM 9.5 9.1   LFT Recent Labs    10/14/17 0450  PROT 6.6  ALBUMIN 2.7*  AST 230*  ALT 126*  ALKPHOS 295*  BILITOT 3.1*   PT/INR Recent Labs    10/13/17 2012  LABPROT 16.5*  INR 1.34     Studies/Results: Ct Abdomen Pelvis W Contrast  Result Date: 10/13/2017 CLINICAL DATA:  Abdominal pain, recent cholecystitis, status post percutaneous cholecystostomy 09/12/2017 EXAM: CT ABDOMEN AND PELVIS WITH CONTRAST TECHNIQUE: Multidetector CT imaging of the abdomen and pelvis was performed using the standard protocol following bolus administration of intravenous contrast. CONTRAST:  158m ISOVUE-300 IOPAMIDOL (ISOVUE-300) INJECTION 61% COMPARISON:  09/12/2017 FINDINGS: Lower chest: Similar bibasilar nodular scarring/atelectasis. No pleural or pericardial effusion. Normal heart size for age. Degenerative changes of the thoracic spine on the right. Hepatobiliary: Mild hypoattenuation of the liver suggesting hepatic steatosis. No focal hepatic abnormality or biliary obstruction. No surrounding perihepatic free fluid, hemorrhage, or subcapsular hematoma. Interval right upper quadrant transhepatic cholecystostomy within the gallbladder. There is interval improvement in the gallbladder distension and surrounding pericholecystic fluid inflammation. Gallbladder is decompressed. Common bile duct nondilated. Pancreas: Unremarkable. No pancreatic ductal dilatation or surrounding inflammatory changes. Spleen: Normal  in size without focal abnormality. Adrenals/Urinary Tract: Normal adrenal glands. Normal symmetric renal enhancement and excretion. 12 mm right renal cortical exophytic cyst as before. Ureters are symmetric and decompressed. No obstructing ureteral calculus. Bladder is underdistended. Stomach/Bowel: Negative for bowel obstruction, significant dilatation, ileus, or free air. Scattered colonic diverticulosis. Remote appendectomy noted. No fluid collection or abscess. No ascites. Vascular/Lymphatic: Aortic atherosclerosis. Negative for aneurysm or occlusive process. Mesenteric and renal vasculature appear patent. No adenopathy. Reproductive: Uterus and adnexa normal in size. Uterus is retroverted. No pelvic fluid collection, hemorrhage, abscess or hematoma. Other: No abdominal wall hernia or abnormality. No abdominopelvic ascites. Musculoskeletal: Degenerative changes of the spine. No acute osseous finding. Lower lumbar facet arthropathy noted IMPRESSION: Status post interval  percutaneous transhepatic cholecystostomy. Cholecystostomy catheter in good position within the gallbladder. There is improvement in the gallbladder distention, pericholecystic fluid and inflammation compatible with resolving cholecystitis. No biliary dilatation or obstruction Mild hepatic steatosis No fluid collection or abscess Other chronic findings as above Electronically Signed   By: Jerilynn Mages.  Shick M.D.   On: 10/13/2017 22:23   US Abdomen Limited Ruq  Result Date: 10/14/2017 CLINICAL DATA:  Right upper quadrant pain, status post cholecystostomy EXAM: ULTRASOUND ABDOMEN LIMITED RIGHT UPPER QUADRANT COMPARISON:  CT abdomen/pelvis dated 10/13/2017 FINDINGS: Gallbladder: Mild wall thickening with layering sludge and indwelling cholecystostomy tube. No definite pericholecystic fluid. Positive sonographic Murphy's sign. Common bile duct: Diameter: 6 mm Liver: Hyperechoic hepatic parenchyma with coarse hepatic echotexture. No focal hepatic lesion is  seen. Portal vein is patent on color Doppler imaging with normal direction of blood flow towards the liver. IMPRESSION: Cholecystostomy tube in satisfactory position. Associated gallbladder wall thickening with layering sludge. Hyperechoic hepatic parenchyma with coarse hepatic echotexture, suggesting hepatic steatosis. Electronically Signed   By: Julian Hy M.D.   On: 10/14/2017 01:18     Angela Savoy, NP-C @  10/14/2017, 11:19 AM    Attending physician's note   I have taken a history, examined the patient and reviewed the chart. I agree with the Advanced Practitioner's note, impression and recommendations. Patient was recently admitted with acute cholecystitis had percutaneous cholecystostomy tube placed as she was considered to be a poor surgical candidate. At that drain capped on September 25 and started developing abdominal pain, chills and fever 2 days later.  Abdominal ultrasound shows persistent thickening of gallbladder wall and sludge. Please consult IR to uncap the drain and connect to a bag to drain.  We will sign off, available if have any questions.   Damaris Hippo , MD [phone]

## 2017-10-14 NOTE — ED Notes (Signed)
Ultrasound just completed.

## 2017-10-14 NOTE — H&P (Signed)
History and Physical    [name] OZH:[id number] DOB: 1961-01-11 DOA: 10/13/2017  PCP: Cassandria Anger, MD   Patient coming from: Home  Chief Complaint: Abdominal pain, lethargy  HPI: [name] is a 57 y.o. 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  Patient presented to the ED with complaints of right-sided abdominal pain of 3 days duration.  Poor appetite weakness and feeling lethargic, and passing dark urine over the past few days also.  She denies fever or chills.  Had only one episode of loose stool earlier in the day.  She also reports urinary frequency without dysuria or lower abdominal pain.  Recent hospital admission 8/30- 09/19/17 sepsis 2/2 acute cholecystitis with possible intrahepatic and pericholecystic abscesses.  Patient evaluated by general surgery and told decided that due to her pulmonary sarcoidosis she is not a good candidate for surgery.  IR placed percutaneous drain 8/31. ID was also consulted inpatient with recommendations for Bactrim for 2 weeks.  He had followed up with IR, 10/08/17, with minimal bilious output from drain, patient's drain was capped to follow-up in 2 weeks.  Patient reports since drain was removed she initially did well until the past 3 days.  ED Course: Stable vitals.  WBC 7.  Elevated liver enzymes-AST 190 ALT 116 ALP 305 total bili 3.3 UA moderate leukocytes rare bacteria.  CT abdomen and pelvis with contrast-status post interval percutaneous transhepatic cholecystostomy.  Cholecystostomy catheter in good position within the gallbladder.  Improvement in gallbladder distention pericholecystic fluid, resolving cholecystitis. 1L bolus given. GI was consulted, recommended right upper quadrant ultrasound they will see in a.m. will likely need IR involvement also, start IV antibiotics if fever.  Review of Systems: As per HPI all other systems reviewed and  negative  Past Medical History:  Diagnosis Date  . Allergic rhinitis   . ALLERGIC RHINITIS 10/26/2009  . Anxiety   . ANXIETY 08/14/2006  . B12 DEFICIENCY 08/25/2007  . Complication of anesthesia    pt has had difficulty following anesthesia with her knee in 2016-unable to care for herself afterward  . Confusion   . Depression    takes Lexapro daily  . Diabetes mellitus without complication ([place])    was on insulin but has been off since Nov 2015 and now only takes Metformin daily  . DYSPNEA 04/28/2009   with exertion  . Esophageal reflux    takes Nexium daily  . Fibromyalgia   . Headache    last migraine 2-26yrs ago;takes Topamax daily  . History of shingles   . Hypertension    takes Coreg daily  . Insomnia    takes Nortriptyline nightly   . Joint pain   . Joint swelling   . Left knee pain   . Lichen planus   . Long-term memory loss   . Nocturia   . OSA (obstructive sleep apnea)    doesn't use CPAP;sleep study in epic from 2006  . Osteoarthritis   . Osteoarthritis   . Pneumonia    over 30 yrs ago  . Protein calorie malnutrition ([place])   . Rheumatoid arthritis ([place])   . Sarcoidosis    Dr. Lolita Patella  . Short-term memory loss   . Shortness of breath   . TIA (transient ischemic attack)   . Unsteady gait   . Urinary urgency   . Vitamin D deficiency    is supposed to take Vit D but can't afford it  . VITAMIN  D DEFICIENCY 08/25/2007    Past Surgical History:  Procedure Laterality Date  . APPENDECTOMY    . arthroscopic knee surgery Right 11-12-04  . AXILLARY ABCESS IRRIGATION AND DEBRIDEMENT  Jul & Aug2012  . CARPAL TUNNEL RELEASE Left 05/23/2014   Procedure: CARPAL TUNNEL RELEASE;  Surgeon: Meredith Pel, MD;  Location: [facility];  Service: Orthopedics;  Laterality: Left;  . cyst removed from top of buttocks  at age 24  . ENDOMETRIAL ABLATION    . IR PERC CHOLECYSTOSTOMY  09/13/2017  . IR RADIOLOGIST EVAL & MGMT  10/08/2017  . LACRIMAL DUCT EXPLORATION Right 06/26/2017     Procedure: LACRIMAL DUCT EXPLORATION AND ETHMOIDECTOMY;  Surgeon: Clista Bernhardt, MD;  Location: [facility];  Service: Ophthalmology;  Laterality: Right;  . TEAR DUCT PROBING Right 06/26/2017   Procedure: TEAR DUCT PROBING WITH STENT;  Surgeon: Clista Bernhardt, MD;  Location: [facility];  Service: Ophthalmology;  Laterality: Right;  . TOTAL KNEE ARTHROPLASTY Right 11/15/2014   Procedure: TOTAL RIGHT KNEE ARTHROPLASTY;  Surgeon: Meredith Pel, MD;  Location: [facility];  Service: Orthopedics;  Laterality: Right;  . TOTAL KNEE ARTHROPLASTY Left 07/13/2015   Procedure: LEFT TOTAL KNEE ARTHROPLASTY;  Surgeon: Meredith Pel, MD;  Location: [place];  Service: Orthopedics;  Laterality: Left;     reports that she has quit smoking. Her smoking use included cigarettes. She has a 5.00 pack-year smoking history. She has never used smokeless tobacco. She reports that she does not drink alcohol or use drugs.  Allergies  Allergen Reactions  . Belsomra [Suvorexant] Other (See Comments)    [place] out of bed while asleep: "I'm waking up as I'm falling on the floor;" "Night terrors"  . Enalapril Maleate Cough  . Latex Hives  . Nickel Other (See Comments)    Blisters  Pt has a titanium right and left knee - nickel causes skin irritations that form into blisters and sores  . Other Other (See Comments)    Patient has Sarcoidosis and can't tolerate any metals  . Iodine Other (See Comments)    Patient received IV contrast without pre-meds without any problems 09/12/17 kwl Mother was intolerant--CODED on CT table---pt never tried  . Doxycycline Diarrhea and Nausea And Vomiting  . Hydrochlorothiazide Other (See Comments)    Low potassium levels   . Hydroxychloroquine Sulfate Other (See Comments)    Vision changes   . Lyrica [Pregabalin] Other (See Comments)    "Made me feel high"  . Tape Other (See Comments)    Medical tape causes bruising    Family History  Problem Relation Age of Onset  . Heart disease  Mother   . Kidney disease Mother   . Cancer Father        leukemia  . Hypertension Unknown   . Coronary artery disease Unknown        female 1st degree relative <60  . Heart failure Unknown        congestive  . Coronary artery disease Unknown        Female 1st degree relative <50  . Breast cancer Unknown        1st degree relative <50 S  . Breast cancer Sister     Prior to Admission medications   Medication Sig Start Date End Date Taking? Authorizing Provider  acetaminophen (TYLENOL) 325 MG tablet Take 2 tablets (650 mg total) by mouth every 6 (six) hours as needed for mild pain, fever or headache. 09/19/17  Yes Rizwan,  Saima, MD  augmented betamethasone dipropionate (DIPROLENE-AF) 0.05 % ointment Apply topically 2 (two) times daily. 09/30/17  Yes [provider]  budesonide-formoterol (SYMBICORT) 160-4.5 MCG/ACT inhaler Inhale 2 puffs into the lungs TWICE DAILY 120/4=30 06/10/17  Yes Rigoberto Noel, MD  carvedilol (COREG) 12.5 MG tablet Take 1 tablet (12.5 mg total) by mouth 2 (two) times daily with a meal. 06/12/17  Yes Plotnikov, Evie Lacks, MD  clonazePAM (KLONOPIN) 0.5 MG tablet Take 1-2 tablets (0.5-1 mg total) by mouth at bedtime. Patient taking differently: Take 0.5 mg by mouth at bedtime.  08/04/17  Yes Plotnikov, Evie Lacks, MD  esomeprazole (NEXIUM) 40 MG capsule Take 1 capsule (40 mg total) by mouth daily before breakfast. 08/07/17  Yes Plotnikov, Evie Lacks, MD  JANUVIA 50 MG tablet Take 1 tablet (50 mg total) by mouth daily. Patient taking differently: Take 50 mg by mouth daily.  09/10/17  Yes Philemon Kingdom, MD  metFORMIN (GLUCOPHAGE-XR) 500 MG 24 hr tablet TAKE 2 TABLETS(1000 MG) BY MOUTH DAILY WITH BREAKFAST 06/12/17  Yes Plotnikov, Evie Lacks, MD  oxyCODONE (OXY IR/ROXICODONE) 5 MG immediate release tablet Take 1 tablet (5 mg total) by mouth every 4 (four) hours as needed for moderate pain. 09/19/17  Yes Debbe Odea, MD  polyethylene glycol (MIRALAX / GLYCOLAX) packet Take  17 g by mouth daily. 09/19/17  Yes Debbe Odea, MD  potassium chloride (K-DUR) 10 MEQ tablet Take 1 tablet (10 mEq total) by mouth daily. 09/24/17  Yes Plotnikov, Evie Lacks, MD  topiramate (TOPAMAX) 50 MG tablet Take 1 tablet (50 mg total) by mouth 2 (two) times daily. 06/12/17  Yes Plotnikov, Evie Lacks, MD  venlafaxine XR (EFFEXOR-XR) 75 MG 24 hr capsule Take 1 capsule (75 mg total) by mouth daily with breakfast. 03/04/17  Yes Plotnikov, Evie Lacks, MD  XARELTO 20 MG TABS tablet Take 1 tablet (20 mg total) by mouth at bedtime. 06/12/17  Yes Plotnikov, Evie Lacks, MD  Adalimumab (HUMIRA) 40 MG/0.8ML PSKT Inject 40 mg into the skin every 7 (seven) days.     [provider]  B-D ULTRA-FINE 33 LANCETS MISC Use as instructed to test sugars 4 times daily DX: E11.8 09/22/17   Philemon Kingdom, MD  furosemide (LASIX) 40 MG tablet TAKE 1 TABLET BY MOUTH EVERY MORNING FOR EDEMA Patient taking differently: Take 20 mg by mouth daily as needed for fluid or edema.  01/10/17   Plotnikov, Evie Lacks, MD  gabapentin (NEURONTIN) 300 MG capsule Take 1 capsule (300 mg total) by mouth 2 (two) times daily as needed (for pain). 06/12/17   Plotnikov, Evie Lacks, MD  glucose blood (EZ SMART PLUS GLUCOSE TEST) test strip Use as instructed to test sugars 4 times daily DX: E11.8 09/22/17   Philemon Kingdom, MD  promethazine (PHENERGAN) 25 MG tablet Take 1 tablet (25 mg total) by mouth every 8 (eight) hours as needed for nausea or vomiting. Patient not taking: Reported on 10/06/2017 09/03/17   Plotnikov, Evie Lacks, MD  VENTOLIN HFA 108 (90 Base) MCG/ACT inhaler Inhale 2 puffs into the lungs every 6 (six) hours as needed for wheezing or shortness of breath. 200/8=25 Patient taking differently: Inhale 2 puffs into the lungs every 6 (six) hours as needed for wheezing or shortness of breath.  12/11/16   Plotnikov, Evie Lacks, MD  VOLTAREN 1 % GEL Apply 2 g topically 2 (two) times daily as needed (for pain). 100/4=25 Patient taking  differently: Apply 4 g topically 4 (four) times daily as needed (pain).  04/29/17   Plotnikov, Evie Lacks, MD    Physical Exam: Vitals:   10/13/17 1939 10/13/17 2043 10/13/17 2141 10/14/17 0125  BP: 117/71  138/76 (!) 144/76  Pulse: 66  76 67  Resp: 19  18 20   Temp:  (!) 96.8 F (36 C)    TempSrc:  Rectal    SpO2: 100%  99% 100%  Weight:      Height:        Constitutional: NAD, calm, comfortable Vitals:   10/13/17 1939 10/13/17 2043 10/13/17 2141 10/14/17 0125  BP: 117/71  138/76 (!) 144/76  Pulse: 66  76 67  Resp: 19  18 20   Temp:  (!) 96.8 F (36 C)    TempSrc:  Rectal    SpO2: 100%  99% 100%  Weight:      Height:       Eyes: PERRL, lids and conjunctivae normal ENMT: Mucous membranes are moist. Posterior pharynx clear of any exudate or lesions.Normal dentition.  Neck: normal, supple, no masses, no thyromegaly Respiratory: clear to auscultation bilaterally, no wheezing, no crackles. Normal respiratory effort. No accessory muscle use.  Cardiovascular: Regular rate and rhythm, no murmurs / rubs / gallops. No extremity edema. 2+ pedal pulses. No carotid bruits.  Abdomen: Right sided tenderness, soft abdomen, no masses palpated. No hepatosplenomegaly. Bowel sounds positive.  Musculoskeletal: no clubbing / cyanosis. No joint deformity upper and lower extremities. Good ROM, no contractures. Normal muscle tone.  Skin: no rashes, lesions, ulcers. No induration Neurologic: CN 2-12 grossly intact.  Strength 5/5 in all 4.  Psychiatric: Normal judgment and insight. Alert and oriented x 3. Normal mood.   Labs on Admission: I have personally reviewed following labs and imaging studies  CBC: Recent Labs  Lab 10/13/17 1509  WBC 7.0  NEUTROABS 3.2  HGB 10.5*  HCT 33.7*  MCV 91.1  PLT 027   Basic Metabolic Panel: Recent Labs  Lab 10/13/17 1509  NA 145  K 4.1  CL 111  CO2 23  GLUCOSE 96  BUN 14  CREATININE 0.96  CALCIUM 9.5   Liver Function Tests: Recent Labs  Lab  10/13/17 1509  AST 190*  ALT 116*  ALKPHOS 305*  BILITOT 3.3*  PROT 7.3  ALBUMIN 2.9*   Recent Labs  Lab 10/13/17 1509  LIPASE 33   Coagulation Profile: Recent Labs  Lab 10/13/17 2012  INR 1.34   BNP (last 3 results) Recent Labs    11/11/16 1051  PROBNP 194.0*   Urine analysis:    Component Value Date/Time   COLORURINE AMBER (A) 10/13/2017 2101   APPEARANCEUR CLOUDY (A) 10/13/2017 2101   LABSPEC 1.018 10/13/2017 2101   PHURINE 7.0 10/13/2017 2101   GLUCOSEU NEGATIVE 10/13/2017 2101   GLUCOSEU NEGATIVE 04/27/2013 1218   HGBUR SMALL (A) 10/13/2017 2101   BILIRUBINUR SMALL (A) 10/13/2017 2101   KETONESUR 5 (A) 10/13/2017 2101   PROTEINUR 30 (A) 10/13/2017 2101   UROBILINOGEN 1.0 11/08/2014 1050   NITRITE NEGATIVE 10/13/2017 2101   LEUKOCYTESUR MODERATE (A) 10/13/2017 2101    Radiological Exams on Admission: Ct Abdomen Pelvis W Contrast  Result Date: 10/13/2017 CLINICAL DATA:  Abdominal pain, recent cholecystitis, status post percutaneous cholecystostomy 09/12/2017 EXAM: CT ABDOMEN AND PELVIS WITH CONTRAST TECHNIQUE: Multidetector CT imaging of the abdomen and pelvis was performed using the standard protocol following bolus administration of intravenous contrast. CONTRAST:  132mL ISOVUE-300 IOPAMIDOL (ISOVUE-300) INJECTION 61% COMPARISON:  09/12/2017 FINDINGS: Lower chest: Similar bibasilar nodular scarring/atelectasis. No pleural or pericardial effusion.  Normal heart size for age. Degenerative changes of the thoracic spine on the right. Hepatobiliary: Mild hypoattenuation of the liver suggesting hepatic steatosis. No focal hepatic abnormality or biliary obstruction. No surrounding perihepatic free fluid, hemorrhage, or subcapsular hematoma. Interval right upper quadrant transhepatic cholecystostomy within the gallbladder. There is interval improvement in the gallbladder distension and surrounding pericholecystic fluid inflammation. Gallbladder is decompressed. Common bile  duct nondilated. Pancreas: Unremarkable. No pancreatic ductal dilatation or surrounding inflammatory changes. Spleen: Normal in size without focal abnormality. Adrenals/Urinary Tract: Normal adrenal glands. Normal symmetric renal enhancement and excretion. 12 mm right renal cortical exophytic cyst as before. Ureters are symmetric and decompressed. No obstructing ureteral calculus. Bladder is underdistended. Stomach/Bowel: Negative for bowel obstruction, significant dilatation, ileus, or free air. Scattered colonic diverticulosis. Remote appendectomy noted. No fluid collection or abscess. No ascites. Vascular/Lymphatic: Aortic atherosclerosis. Negative for aneurysm or occlusive process. Mesenteric and renal vasculature appear patent. No adenopathy. Reproductive: Uterus and adnexa normal in size. Uterus is retroverted. No pelvic fluid collection, hemorrhage, abscess or hematoma. Other: No abdominal wall hernia or abnormality. No abdominopelvic ascites. Musculoskeletal: Degenerative changes of the spine. No acute osseous finding. Lower lumbar facet arthropathy noted IMPRESSION: Status post interval percutaneous transhepatic cholecystostomy. Cholecystostomy catheter in good position within the gallbladder. There is improvement in the gallbladder distention, pericholecystic fluid and inflammation compatible with resolving cholecystitis. No biliary dilatation or obstruction Mild hepatic steatosis No fluid collection or abscess Other chronic findings as above Electronically Signed   By: Jerilynn Mages.  Shick M.D.   On: 10/13/2017 22:23   US Abdomen Limited Ruq  Result Date: 10/14/2017 CLINICAL DATA:  Right upper quadrant pain, status post cholecystostomy EXAM: ULTRASOUND ABDOMEN LIMITED RIGHT UPPER QUADRANT COMPARISON:  CT abdomen/pelvis dated 10/13/2017 FINDINGS: Gallbladder: Mild wall thickening with layering sludge and indwelling cholecystostomy tube. No definite pericholecystic fluid. Positive sonographic Murphy's sign. Common  bile duct: Diameter: 6 mm Liver: Hyperechoic hepatic parenchyma with coarse hepatic echotexture. No focal hepatic lesion is seen. Portal vein is patent on color Doppler imaging with normal direction of blood flow towards the liver. IMPRESSION: Cholecystostomy tube in satisfactory position. Associated gallbladder wall thickening with layering sludge. Hyperechoic hepatic parenchyma with coarse hepatic echotexture, suggesting hepatic steatosis. Electronically Signed   By: Julian Hy M.D.   On: 10/14/2017 01:18    EKG: None.  Assessment/Plan Principal Problem:   Abdominal pain Active Problems:   Sarcoidosis   Major depressive disorder, recurrent episode ([place])   Essential hypertension   Controlled type 2 diabetes mellitus with chronic kidney disease, without long-term current use of insulin ([facility])   COPD (chronic obstructive pulmonary disease) ([facility])   Abdominal pain-with worsening transaminitis and elevated bilirubin. AST 190, ALT 116, ALP 305, total bili 3.3  Likely related to recent acute cholecystitis.  But CT abdomen images suggest improvement. And RUQ US-gallbladder wall thickening with layering sludge, hepatic steatosis. (See detailed report). -GI consulted, Dr. Darlyn Chamber, GI will see in a.m. -Consult IR in a.m. - NPO - CMP a.m -Follow-up final urine cultures ordered in ED  Recent Acute cholecystitis with sepsis- requiring admission 8/30- 09/19/17 with possible intrahepatic and pericholecystic abscesses.  Patient evaluated by general surgery and told decided that due to her pulmonary sarcoidosis she is not a good candidate for surgery.  IR placed percutaneous drain 8/31. ID was also consulted inpatient with recommendations for Bactrim for 2 weeks. Pt f/u with IR, 10/08/17 - with minimal bilious output from drain, patient's drain was capped to follow-up in 2 weeks.  Depression & anxiety,  headaches-  -Continue Klonopin as needed nightly, venlafaxine -Continue home topiramate  PE  DVT-on lifelong anticoagulation. -Hold Xarelto for now pending IR and GI evaluation in a.m.  DM-glucose 96.  Last hemoglobin A1c 06/26/2017 5.8. - SSi -Continue Januvia, hold metformin  Sarcoidosis-patient on weekly Humira.  Last dose last Monday.  She did not take Humira yesterday 10/13/17-as she was unsure of why she was having abdominal pain. - Hold humira for now.  HTN- Stable.  - Cont home coreg  COPD- Stable - Cont home inhalers.  DVT prophylaxis: Scds Code Status: Full Family Communication: None at bedside Disposition Plan: Per rounding team Consults called: GI, please consult IR in A.m Admission status: Obs, med-surg   Bethena Roys MD [facility] Pager [phone] From 6PM-2AM.  Otherwise please contact night-coverage [email or web address] Password [email or web address]  10/14/2017, 1:47 AM

## 2017-10-14 NOTE — Progress Notes (Signed)
Initial Nutrition Assessment  DOCUMENTATION CODES:   Morbid obesity  INTERVENTION:    Monitor for diet advancement/toleration  Ensure Enlive po BID, each supplement provides 350 kcal and 20 grams of protein  NUTRITION DIAGNOSIS:   Inadequate oral intake related to decreased appetite as evidenced by per patient/family report.   GOAL:   Patient will meet greater than or equal to 90% of their needs  MONITOR:   Diet advancement, Labs, PO intake, Supplement acceptance, Weight trends, I & O's  REASON FOR ASSESSMENT:   Malnutrition Screening Tool    ASSESSMENT:   Patient with PMH significant for sarcoidosis, CAD, PE  DVT on lifelong anticoagulation, and depression. Recently admitted 8/30- 09/19/17 sepsis 2/2 acute cholecystitis with possible intrahepatic and pericholecystic abscesses. IR placed percutaneous drain 8/31 and it was capped 9/25. Pt presents this admission with abdominal pain and worsening transaminitis and elevated bilirubin.    Pt reports having a loss in appetite 5 days leading up to admission due to increasing abdominal pain upon eating. States during this time period she would try to eat three meals a day that consisted of salmon, baked chicken, broccoli, collard green, and other vegetables. They pain worsened that she was unable to consume anything by mouth for the last three days. She denies any nausea/vomiting. She is currently NPO. Will provide supplements once diet is advanced.   Pt endorses a UBW of 286 lb, the last time being at that weight was in August. Records indicate pt weighed 279 lb on 08/27/17 and 257 lb this admission (7.8% wt loss in two months, significant for time frame).   Pt reports prior to her last admission on 8/30 she lost a significant amount of weight due to nausea/vomting likely secondary to cholecystitis and abscesses. Suspect pt could have been malnourished at that time but with the progression of intake since then and weight stabilization,  pt does not meet criteria this admission. Nutrition-Focused physical exam completed.   Medications reviewed and include: 10 mEq KCl once daily, NS-D5 @ 75 ml/hr Labs reviewed: AST 230 (H) ALT 126 (H)   NUTRITION - FOCUSED PHYSICAL EXAM:    Most Recent Value  Orbital Region  No depletion  Upper Arm Region  No depletion  Thoracic and Lumbar Region  No depletion  Buccal Region  No depletion  Temple Region  No depletion  Clavicle Bone Region  No depletion  Clavicle and Acromion Bone Region  No depletion  Scapular Bone Region  No depletion  Dorsal Hand  No depletion  Patellar Region  No depletion  Anterior Thigh Region  No depletion  Posterior Calf Region  No depletion  Edema (RD Assessment)  None     Diet Order:   Diet Order            Diet NPO time specified Except for: Sips with Meds  Diet effective now              EDUCATION NEEDS:   Education needs have been addressed  Skin:  Skin Assessment: Reviewed RN Assessment  Last BM:  10/13/17  Height:   Ht Readings from Last 1 Encounters:  10/13/17 5\' 7"  (1.702 m)    Weight:   Wt Readings from Last 1 Encounters:  10/13/17 116.6 kg    Ideal Body Weight:  61.4 kg  BMI:  Body mass index is 40.25 kg/m.  Estimated Nutritional Needs:   Kcal:  1750-1950 kcal  Protein:  85-100 grams  Fluid:  >/= 1.7 L/day  Bud Kaeser  Oswaldo Milian RD, [place] Clinical Nutrition Pager # [phone]

## 2017-10-14 NOTE — Progress Notes (Signed)
[name] is a 57 y.o. 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  Patient presented to the ED with complaints of right-sided abdominal pain of 3 days duration.  Poor appetite weakness and feeling lethargic, and passing dark urine over the past few days also.  She denies fever or chills.  Had only one episode of loose stool earlier in the day.  She also reports urinary frequency without dysuria or lower abdominal pain.  Recent hospital admission 8/30- 09/19/17 sepsis 2/2 acute cholecystitis with possible intrahepatic and pericholecystic abscesses.  Patient evaluated by general surgery and told decided that due to her pulmonary sarcoidosis she is not a good candidate for surgery.  IR placed percutaneous drain 8/31. ID was also consulted inpatient with recommendations for Bactrim for 2 weeks.  She had followed up with IR, 10/08/17, with minimal bilious output from drain, patient's drain was capped to follow-up in 2 weeks.  Admitted for intractable abdominal pain.  10/14/2017: Patient seen and examined at bedside.  Reports persistent moderate to severe abdominal pain located in her right upper quadrant improved with pain medications.  Interventional radiology consulted and following.  Afebrile with no leukocytosis.  Will obtain lactic acid and procalcitonin (has a cough), if positive will start on empiric antibiotics.  Please refer to H&P dictated by my partner Dr. Denton Brick on 10/14/2017.  For further details of the assessment and plan.

## 2017-10-14 NOTE — ED Notes (Signed)
Gave report to Bishnu, RN for room 1526

## 2017-10-15 ENCOUNTER — Other Ambulatory Visit: Payer: Self-pay | Admitting: *Deleted

## 2017-10-15 DIAGNOSIS — I10 Essential (primary) hypertension: Secondary | ICD-10-CM

## 2017-10-15 DIAGNOSIS — K810 Acute cholecystitis: Secondary | ICD-10-CM

## 2017-10-15 DIAGNOSIS — J449 Chronic obstructive pulmonary disease, unspecified: Secondary | ICD-10-CM

## 2017-10-15 DIAGNOSIS — R7989 Other specified abnormal findings of blood chemistry: Secondary | ICD-10-CM

## 2017-10-15 DIAGNOSIS — R1011 Right upper quadrant pain: Secondary | ICD-10-CM

## 2017-10-15 DIAGNOSIS — R319 Hematuria, unspecified: Secondary | ICD-10-CM

## 2017-10-15 DIAGNOSIS — R945 Abnormal results of liver function studies: Secondary | ICD-10-CM

## 2017-10-15 DIAGNOSIS — N390 Urinary tract infection, site not specified: Secondary | ICD-10-CM

## 2017-10-15 LAB — BASIC METABOLIC PANEL
ANION GAP: 9 (ref 5–15)
BUN: 8 mg/dL (ref 6–20)
CALCIUM: 9.1 mg/dL (ref 8.9–10.3)
CO2: 23 mmol/L (ref 22–32)
Chloride: 113 mmol/L — ABNORMAL HIGH (ref 98–111)
Creatinine, Ser: 0.85 mg/dL (ref 0.44–1.00)
GFR calc Af Amer: >60 mL/min (ref >60)
GFR calc non Af Amer: >60 mL/min (ref >60)
GLUCOSE: 139 mg/dL — AB (ref 70–99)
Potassium: 3.4 mmol/L — ABNORMAL LOW (ref 3.5–5.1)
Sodium: 145 mmol/L (ref 135–145)

## 2017-10-15 LAB — CBC
HCT: 32.1 % — ABNORMAL LOW (ref 36.0–46.0)
Hemoglobin: 9.9 g/dL — ABNORMAL LOW (ref 12.0–15.0)
MCH: 28.2 pg (ref 26.0–34.0)
MCHC: 30.8 g/dL (ref 30.0–36.0)
MCV: 91.5 fL (ref 78.0–100.0)
Platelets: 279 10*3/uL (ref 150–400)
RBC: 3.51 MIL/uL — ABNORMAL LOW (ref 3.87–5.11)
RDW: 17.7 % — AB (ref 11.5–15.5)
WBC: 5.8 10*3/uL (ref 4.0–10.5)

## 2017-10-15 LAB — HEPATIC FUNCTION PANEL
ALBUMIN: 2.5 g/dL — AB (ref 3.5–5.0)
ALT: 173 U/L — ABNORMAL HIGH (ref 0–44)
AST: 327 U/L — AB (ref 15–41)
Alkaline Phosphatase: 317 U/L — ABNORMAL HIGH (ref 38–126)
BILIRUBIN TOTAL: 2.4 mg/dL — AB (ref 0.3–1.2)
Bilirubin, Direct: 1.6 mg/dL — ABNORMAL HIGH (ref 0.0–0.2)
Indirect Bilirubin: 0.8 mg/dL (ref 0.3–0.9)
Total Protein: 6.2 g/dL — ABNORMAL LOW (ref 6.5–8.1)

## 2017-10-15 LAB — PHOSPHORUS: Phosphorus: 3 mg/dL (ref 2.5–4.6)

## 2017-10-15 LAB — GLUCOSE, CAPILLARY
GLUCOSE-CAPILLARY: 131 mg/dL — AB (ref 70–99)
Glucose-Capillary: 111 mg/dL — ABNORMAL HIGH (ref 70–99)
Glucose-Capillary: 124 mg/dL — ABNORMAL HIGH (ref 70–99)
Glucose-Capillary: 110 mg/dL — ABNORMAL HIGH (ref 70–99)
Glucose-Capillary: 136 mg/dL — ABNORMAL HIGH (ref 70–99)

## 2017-10-15 LAB — MAGNESIUM: Magnesium: 2 mg/dL (ref 1.7–2.4)

## 2017-10-15 MED ORDER — SODIUM CHLORIDE 0.9 % IV SOLN
1.0000 g | INTRAVENOUS | Status: DC
  Administered 2017-10-15 – 2017-10-17 (×3): 1 g via INTRAVENOUS
  Filled 2017-10-15 (×3): qty 1

## 2017-10-15 NOTE — Patient Outreach (Signed)
[facility]) Care Management  10/15/2017  Oneal Biglow [facility] 01/11/1961 [id number]   Noted that member was readmitted to hospital on 9/30 with abdominal pain.  She was recently admitted for cholecystitis and was discharged with a percutaneous drain, which according to chart was recently removed.  Hospital liaisons notified of admit, will follow up after discharge.  Valente David, [place], MSN [place] [phone]

## 2017-10-15 NOTE — Progress Notes (Signed)
Referring Physician(s): Hall,C/Nandigam,K  Supervising Physician: Jacqulynn Cadet  Patient Status:  [facility] - In-pt  Chief Complaint:  Abdominal pain, dysuria  Subjective: Pt doing a little better today since gallbladder drain attached back to gravity bag; denies fever, CP, worsening dyspnea, back pain,N/V or bleeding; does have occ cough but less abd discomfort   Allergies: Belsomra [suvorexant]; Enalapril maleate; Latex; Nickel; Other; Iodine; Doxycycline; Hydrochlorothiazide; Hydroxychloroquine sulfate; Lyrica [pregabalin]; and Tape  Medications: Prior to Admission medications   Medication Sig Start Date End Date Taking? Authorizing Provider  acetaminophen (TYLENOL) 325 MG tablet Take 2 tablets (650 mg total) by mouth every 6 (six) hours as needed for mild pain, fever or headache. 09/19/17  Yes Debbe Odea, MD  augmented betamethasone dipropionate (DIPROLENE-AF) 0.05 % ointment Apply topically 2 (two) times daily. 09/30/17  Yes [provider]  budesonide-formoterol (SYMBICORT) 160-4.5 MCG/ACT inhaler Inhale 2 puffs into the lungs TWICE DAILY 120/4=30 06/10/17  Yes Rigoberto Noel, MD  carvedilol (COREG) 12.5 MG tablet Take 1 tablet (12.5 mg total) by mouth 2 (two) times daily with a meal. 06/12/17  Yes Plotnikov, Evie Lacks, MD  clonazePAM (KLONOPIN) 0.5 MG tablet Take 1-2 tablets (0.5-1 mg total) by mouth at bedtime. Patient taking differently: Take 0.5 mg by mouth at bedtime.  08/04/17  Yes Plotnikov, Evie Lacks, MD  esomeprazole (NEXIUM) 40 MG capsule Take 1 capsule (40 mg total) by mouth daily before breakfast. 08/07/17  Yes Plotnikov, Evie Lacks, MD  JANUVIA 50 MG tablet Take 1 tablet (50 mg total) by mouth daily. Patient taking differently: Take 50 mg by mouth daily.  09/10/17  Yes Philemon Kingdom, MD  metFORMIN (GLUCOPHAGE-XR) 500 MG 24 hr tablet TAKE 2 TABLETS(1000 MG) BY MOUTH DAILY WITH BREAKFAST 06/12/17  Yes Plotnikov, Evie Lacks, MD  oxyCODONE (OXY IR/ROXICODONE) 5  MG immediate release tablet Take 1 tablet (5 mg total) by mouth every 4 (four) hours as needed for moderate pain. 09/19/17  Yes Debbe Odea, MD  polyethylene glycol (MIRALAX / GLYCOLAX) packet Take 17 g by mouth daily. 09/19/17  Yes Debbe Odea, MD  potassium chloride (K-DUR) 10 MEQ tablet Take 1 tablet (10 mEq total) by mouth daily. 09/24/17  Yes Plotnikov, Evie Lacks, MD  topiramate (TOPAMAX) 50 MG tablet Take 1 tablet (50 mg total) by mouth 2 (two) times daily. 06/12/17  Yes Plotnikov, Evie Lacks, MD  venlafaxine XR (EFFEXOR-XR) 75 MG 24 hr capsule Take 1 capsule (75 mg total) by mouth daily with breakfast. 03/04/17  Yes Plotnikov, Evie Lacks, MD  XARELTO 20 MG TABS tablet Take 1 tablet (20 mg total) by mouth at bedtime. 06/12/17  Yes Plotnikov, Evie Lacks, MD  Adalimumab (HUMIRA) 40 MG/0.8ML PSKT Inject 40 mg into the skin every 7 (seven) days.     [provider]  B-D ULTRA-FINE 33 LANCETS MISC Use as instructed to test sugars 4 times daily DX: E11.8 09/22/17   Philemon Kingdom, MD  furosemide (LASIX) 40 MG tablet TAKE 1 TABLET BY MOUTH EVERY MORNING FOR EDEMA Patient taking differently: Take 20 mg by mouth daily as needed for fluid or edema.  01/10/17   Plotnikov, Evie Lacks, MD  gabapentin (NEURONTIN) 300 MG capsule Take 1 capsule (300 mg total) by mouth 2 (two) times daily as needed (for pain). 06/12/17   Plotnikov, Evie Lacks, MD  glucose blood (EZ SMART PLUS GLUCOSE TEST) test strip Use as instructed to test sugars 4 times daily DX: E11.8 09/22/17   Philemon Kingdom, MD  promethazine [facility])  25 MG tablet Take 1 tablet (25 mg total) by mouth every 8 (eight) hours as needed for nausea or vomiting. Patient not taking: Reported on 10/06/2017 09/03/17   Plotnikov, Evie Lacks, MD  VENTOLIN HFA 108 (90 Base) MCG/ACT inhaler Inhale 2 puffs into the lungs every 6 (six) hours as needed for wheezing or shortness of breath. 200/8=25 Patient taking differently: Inhale 2 puffs into the lungs every 6 (six)  hours as needed for wheezing or shortness of breath.  12/11/16   Plotnikov, Evie Lacks, MD  VOLTAREN 1 % GEL Apply 2 g topically 2 (two) times daily as needed (for pain). 100/4=25 Patient taking differently: Apply 4 g topically 4 (four) times daily as needed (pain).  04/29/17   Plotnikov, Evie Lacks, MD     Vital Signs: BP 136/77 (BP Location: Left Arm)   Pulse 68   Temp 98.9 F (37.2 C) (Oral)   Resp 18   Ht 5\' 7"  (1.702 m)   Wt 257 lb (116.6 kg)   SpO2 98%   BMI 40.25 kg/m   Physical Exam GB drain intact, insertion site ok, mildly tender, output 50 cc green bile; drain flushed without difficulty  Imaging: Ct Abdomen Pelvis W Contrast  Result Date: 10/13/2017 CLINICAL DATA:  Abdominal pain, recent cholecystitis, status post percutaneous cholecystostomy 09/12/2017 EXAM: CT ABDOMEN AND PELVIS WITH CONTRAST TECHNIQUE: Multidetector CT imaging of the abdomen and pelvis was performed using the standard protocol following bolus administration of intravenous contrast. CONTRAST:  168mL ISOVUE-300 IOPAMIDOL (ISOVUE-300) INJECTION 61% COMPARISON:  09/12/2017 FINDINGS: Lower chest: Similar bibasilar nodular scarring/atelectasis. No pleural or pericardial effusion. Normal heart size for age. Degenerative changes of the thoracic spine on the right. Hepatobiliary: Mild hypoattenuation of the liver suggesting hepatic steatosis. No focal hepatic abnormality or biliary obstruction. No surrounding perihepatic free fluid, hemorrhage, or subcapsular hematoma. Interval right upper quadrant transhepatic cholecystostomy within the gallbladder. There is interval improvement in the gallbladder distension and surrounding pericholecystic fluid inflammation. Gallbladder is decompressed. Common bile duct nondilated. Pancreas: Unremarkable. No pancreatic ductal dilatation or surrounding inflammatory changes. Spleen: Normal in size without focal abnormality. Adrenals/Urinary Tract: Normal adrenal glands. Normal symmetric  renal enhancement and excretion. 12 mm right renal cortical exophytic cyst as before. Ureters are symmetric and decompressed. No obstructing ureteral calculus. Bladder is underdistended. Stomach/Bowel: Negative for bowel obstruction, significant dilatation, ileus, or free air. Scattered colonic diverticulosis. Remote appendectomy noted. No fluid collection or abscess. No ascites. Vascular/Lymphatic: Aortic atherosclerosis. Negative for aneurysm or occlusive process. Mesenteric and renal vasculature appear patent. No adenopathy. Reproductive: Uterus and adnexa normal in size. Uterus is retroverted. No pelvic fluid collection, hemorrhage, abscess or hematoma. Other: No abdominal wall hernia or abnormality. No abdominopelvic ascites. Musculoskeletal: Degenerative changes of the spine. No acute osseous finding. Lower lumbar facet arthropathy noted IMPRESSION: Status post interval percutaneous transhepatic cholecystostomy. Cholecystostomy catheter in good position within the gallbladder. There is improvement in the gallbladder distention, pericholecystic fluid and inflammation compatible with resolving cholecystitis. No biliary dilatation or obstruction Mild hepatic steatosis No fluid collection or abscess Other chronic findings as above Electronically Signed   By: Jerilynn Mages.  Shick M.D.   On: 10/13/2017 22:23   US Abdomen Limited Ruq  Result Date: 10/14/2017 CLINICAL DATA:  Right upper quadrant pain, status post cholecystostomy EXAM: ULTRASOUND ABDOMEN LIMITED RIGHT UPPER QUADRANT COMPARISON:  CT abdomen/pelvis dated 10/13/2017 FINDINGS: Gallbladder: Mild wall thickening with layering sludge and indwelling cholecystostomy tube. No definite pericholecystic fluid. Positive sonographic Murphy's sign. Common bile duct: Diameter: 6 mm  Liver: Hyperechoic hepatic parenchyma with coarse hepatic echotexture. No focal hepatic lesion is seen. Portal vein is patent on color Doppler imaging with normal direction of blood flow towards  the liver. IMPRESSION: Cholecystostomy tube in satisfactory position. Associated gallbladder wall thickening with layering sludge. Hyperechoic hepatic parenchyma with coarse hepatic echotexture, suggesting hepatic steatosis. Electronically Signed   By: Julian Hy M.D.   On: 10/14/2017 01:18    Labs:  CBC: Recent Labs    09/18/17 0517 09/24/17 1520 10/13/17 1509 10/15/17 0331  WBC 6.5 7.3 7.0 5.8  HGB 9.1* 11.0* 10.5* 9.9*  HCT 30.6* 35.1* 33.7* 32.1*  PLT 556* 339.0 253 279    COAGS: Recent Labs    06/26/17 0751 09/13/17 0413 09/13/17 1211 09/13/17 2150 09/14/17 0841 10/13/17 2012  INR 0.93  --   --   --   --  1.34  APTT  --  76* 75* 67* 96*  --     BMP: Recent Labs    09/19/17 0459 09/24/17 1520 10/13/17 1509 10/14/17 0450 10/15/17 0331  NA  --  140 145 142 145  K  --  3.8 4.1 3.5 3.4*  CL  --  108 111 111 113*  CO2  --  25 23 23 23   GLUCOSE  --  97 96 91 139*  BUN  --  7 14 13 8   CALCIUM  --  8.8 9.5 9.1 9.1  CREATININE 1.17* 1.15 0.96 0.92 0.85  GFRNONAA 51*  --  >60 >60 >60  GFRAA 59*  --  >60 >60 >60    LIVER FUNCTION TESTS: Recent Labs    09/17/17 0509 09/24/17 1520 10/13/17 1509 10/14/17 0450  BILITOT 0.5 0.5 3.3* 3.1*  AST 17 40* 190* 230*  ALT 26 25 116* 126*  ALKPHOS 125 296* 305* 295*  PROT 6.2* 7.1 7.3 6.6  ALBUMIN 2.0* 3.2* 2.9* 2.7*    Assessment and Plan: Pt with hx cholecystitis, s/p perc GB drain placement 09/13/17 (poor surgical candidate); drain capped 10/08/17 due to patent cystic duct/CBD on cholangiogram same day; now with increasing abd pain, rise in LFT's, dysuria;  Afebrile; WBC nl; hgb 9.9(10.5), creat nl; no LFT's today; urine cx- proteus; cont with tid drain NS flushes, output/lab monitoring, cx bile; will need f/u cholangiogram/drain exchange approx 8 weeks post placement  Electronically Signed: D. Rowe Robert, PA-C 10/15/2017, 10:32 AM   I spent a total of 15 minutes at the the patient's bedside AND on the  patient's hospital floor or unit, greater than 50% of which was counseling/coordinating care for gallbladder drain    Patient ID: [name], female   DOB: [birth year], 57 y.o.   MRN: [id number]

## 2017-10-15 NOTE — Progress Notes (Signed)
PROGRESS NOTE    [name]  YFV:[id number] DOB: 1960/06/13 DOA: 10/13/2017 PCP: Cassandria Anger, MD   Brief Narrative: [name] is a 57 y.o. 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 She presented with abdominal pain secondary to    Assessment & Plan:   Principal Problem:   Abdominal pain Active Problems:   Sarcoidosis   Major depressive disorder, recurrent episode ([place])   Essential hypertension   Controlled type 2 diabetes mellitus with chronic kidney disease, without long-term current use of insulin (HCC)   COPD (chronic obstructive pulmonary disease) (HCC)   Cholecystitis, acute   Abnormal liver function test   Abdominal pain Secondary to capped biliary drain. Associated elevated LFTs. Radiology on board and have attached drain to gravity bag. Cultures obtained. -Repeat CMP  History of acute cholecystitis with sepsis S/p biliary drain.  Depression/anxiety -Continue Topamax, Klonopin and venlafaxine  PE/DVT Xarelto held on admission. Restarted.  Diabetes mellitus, type 2 -Continue Januvia -Continue SSI  Sarcoidosis On Humira weekly as an outpatient  Essential hypertension -Continue home Coreg  COPD Chronic respiratory failure with hypoxia On 2 liters of O2 via   Proteus UTI Urinary frequency as a symptom. No dysuria. -Start Ceftriaxone   DVT prophylaxis: Xarelto Code Status:   Code Status: Full Code Family Communication: None at bedside Disposition Plan: Discharge in 24 hours if stable and pending IR recommendations   Consultants:   Interventional radiology  Procedures:   None  Antimicrobials:  Ceftriaxone    Subjective: Urinary frequency. Abdominal pain improving.  Objective: Vitals:   10/15/17 0808 10/15/17 1211 10/15/17 1213 10/15/17 1346  BP: 136/77   126/70  Pulse: 68   72  Resp: 18   18  Temp: 98.9 F (37.2 C)   98.4 F  (36.9 C)  TempSrc: Oral   Oral  SpO2: 98% 93% 93% 97%  Weight:      Height:        Intake/Output Summary (Last 24 hours) at 10/15/2017 1651 Last data filed at 10/15/2017 1346 Gross per 24 hour  Intake 2399.33 ml  Output 995 ml  Net 1404.33 ml   Filed Weights   10/13/17 1458  Weight: 116.6 kg    Examination:  General exam: Appears calm and comfortable Respiratory system: Clear to auscultation. Respiratory effort normal. Cardiovascular system: S1 & S2 heard, RRR. No murmurs, rubs, gallops or clicks. Gastrointestinal system: Abdomen is nondistended, soft and mild abdominal pain. No organomegaly or masses felt. Normal bowel sounds heard. Central nervous system: Alert and oriented. No focal neurological deficits. Extremities: No edema. No calf tenderness Skin: No cyanosis. No rashes Psychiatry: Judgement and insight appear normal. Mood & affect appropriate.     Data Reviewed: I have personally reviewed following labs and imaging studies  CBC: Recent Labs  Lab 10/13/17 1509 10/15/17 0331  WBC 7.0 5.8  NEUTROABS 3.2  --   HGB 10.5* 9.9*  HCT 33.7* 32.1*  MCV 91.1 91.5  PLT 253 163   Basic Metabolic Panel: Recent Labs  Lab 10/13/17 1509 10/14/17 0450 10/15/17 0331  NA 145 142 145  K 4.1 3.5 3.4*  CL 111 111 113*  CO2 23 23 23   GLUCOSE 96 91 139*  BUN 14 13 8   CREATININE 0.96 0.92 0.85  CALCIUM 9.5 9.1 9.1  MG  --   --  2.0  PHOS  --   --  3.0   GFR: Estimated Creatinine Clearance: 96.4 mL/min (  by C-G formula based on SCr of 0.85 mg/dL). Liver Function Tests: Recent Labs  Lab 10/13/17 1509 10/14/17 0450 10/15/17 0331  AST 190* 230* 327*  ALT 116* 126* 173*  ALKPHOS 305* 295* 317*  BILITOT 3.3* 3.1* 2.4*  PROT 7.3 6.6 6.2*  ALBUMIN 2.9* 2.7* 2.5*   Recent Labs  Lab 10/13/17 1509  LIPASE 33   No results for input(s): AMMONIA in the last 168 hours. Coagulation Profile: Recent Labs  Lab 10/13/17 2012  INR 1.34   Cardiac Enzymes: No results  for input(s): CKTOTAL, CKMB, CKMBINDEX, TROPONINI in the last 168 hours. BNP (last 3 results) Recent Labs    11/11/16 1051  PROBNP 194.0*   HbA1C: No results for input(s): HGBA1C in the last 72 hours. CBG: Recent Labs  Lab 10/14/17 2354 10/15/17 0331 10/15/17 0732 10/15/17 1141 10/15/17 1638  GLUCAP 119* 131* 111* 124* 110*   Lipid Profile: No results for input(s): CHOL, HDL, LDLCALC, TRIG, CHOLHDL, LDLDIRECT in the last 72 hours. Thyroid Function Tests: No results for input(s): TSH, T4TOTAL, FREET4, T3FREE, THYROIDAB in the last 72 hours. Anemia Panel: No results for input(s): VITAMINB12, FOLATE, FERRITIN, TIBC, IRON, RETICCTPCT in the last 72 hours. Sepsis Labs: Recent Labs  Lab 10/14/17 1712  PROCALCITON 0.23  LATICACIDVEN 1.0    Recent Results (from the past 240 hour(s))  Urine culture     Status: Abnormal (Preliminary result)   Collection Time: 10/13/17  9:01 PM  Result Value Ref Range Status   Specimen Description   Final    URINE, CLEAN CATCH Performed at [facility], [place] [address] [place], [place] [address]    Special Requests NONE  Final   Culture (A)  Final    >=100,000 COLONIES/mL PROTEUS MIRABILIS SUSCEPTIBILITIES TO FOLLOW Performed at [facility], [place] [address], [place], [place] [address]    Report Status PENDING  Incomplete  Aerobic Culture (superficial specimen)     Status: None (Preliminary result)   Collection Time: 10/15/17 12:21 PM  Result Value Ref Range Status   Specimen Description   Final    BILE Performed at [place] [address] [place], [place] [address]    Special Requests   Final    NONE Performed at [facility], [place] [address], [place], [place] [address]    Gram Stain   Final    NO WBC SEEN RARE GRAM POSITIVE COCCI Performed at [facility], [place] [address] [place], [place] [address]    Culture PENDING  Incomplete   Report Status PENDING   Incomplete         Radiology Studies: Ct Abdomen Pelvis W Contrast  Result Date: 10/13/2017 CLINICAL DATA:  Abdominal pain, recent cholecystitis, status post percutaneous cholecystostomy 09/12/2017 EXAM: CT ABDOMEN AND PELVIS WITH CONTRAST TECHNIQUE: Multidetector CT imaging of the abdomen and pelvis was performed using the standard protocol following bolus administration of intravenous contrast. CONTRAST:  125mL ISOVUE-300 IOPAMIDOL (ISOVUE-300) INJECTION 61% COMPARISON:  09/12/2017 FINDINGS: Lower chest: Similar bibasilar nodular scarring/atelectasis. No pleural or pericardial effusion. Normal heart size for age. Degenerative changes of the thoracic spine on the right. Hepatobiliary: Mild hypoattenuation of the liver suggesting hepatic steatosis. No focal hepatic abnormality or biliary obstruction. No surrounding perihepatic free fluid, hemorrhage, or subcapsular hematoma. Interval right upper quadrant transhepatic cholecystostomy within the gallbladder. There is interval improvement in the gallbladder distension and surrounding pericholecystic fluid inflammation. Gallbladder is decompressed. Common bile duct nondilated. Pancreas: Unremarkable. No  pancreatic ductal dilatation or surrounding inflammatory changes. Spleen: Normal in size without focal abnormality. Adrenals/Urinary Tract: Normal adrenal glands. Normal symmetric renal enhancement and excretion. 12 mm right renal cortical exophytic cyst as before. Ureters are symmetric and decompressed. No obstructing ureteral calculus. Bladder is underdistended. Stomach/Bowel: Negative for bowel obstruction, significant dilatation, ileus, or free air. Scattered colonic diverticulosis. Remote appendectomy noted. No fluid collection or abscess. No ascites. Vascular/Lymphatic: Aortic atherosclerosis. Negative for aneurysm or occlusive process. Mesenteric and renal vasculature appear patent. No adenopathy. Reproductive: Uterus and adnexa normal in size. Uterus is  retroverted. No pelvic fluid collection, hemorrhage, abscess or hematoma. Other: No abdominal wall hernia or abnormality. No abdominopelvic ascites. Musculoskeletal: Degenerative changes of the spine. No acute osseous finding. Lower lumbar facet arthropathy noted IMPRESSION: Status post interval percutaneous transhepatic cholecystostomy. Cholecystostomy catheter in good position within the gallbladder. There is improvement in the gallbladder distention, pericholecystic fluid and inflammation compatible with resolving cholecystitis. No biliary dilatation or obstruction Mild hepatic steatosis No fluid collection or abscess Other chronic findings as above Electronically Signed   By: Jerilynn Mages.  Shick M.D.   On: 10/13/2017 22:23   US Abdomen Limited Ruq  Result Date: 10/14/2017 CLINICAL DATA:  Right upper quadrant pain, status post cholecystostomy EXAM: ULTRASOUND ABDOMEN LIMITED RIGHT UPPER QUADRANT COMPARISON:  CT abdomen/pelvis dated 10/13/2017 FINDINGS: Gallbladder: Mild wall thickening with layering sludge and indwelling cholecystostomy tube. No definite pericholecystic fluid. Positive sonographic Murphy's sign. Common bile duct: Diameter: 6 mm Liver: Hyperechoic hepatic parenchyma with coarse hepatic echotexture. No focal hepatic lesion is seen. Portal vein is patent on color Doppler imaging with normal direction of blood flow towards the liver. IMPRESSION: Cholecystostomy tube in satisfactory position. Associated gallbladder wall thickening with layering sludge. Hyperechoic hepatic parenchyma with coarse hepatic echotexture, suggesting hepatic steatosis. Electronically Signed   By: Julian Hy M.D.   On: 10/14/2017 01:18        Scheduled Meds: . carvedilol  12.5 mg Oral BID WC  . clonazePAM  0.5 mg Oral QHS  . insulin aspart  0-9 Units Subcutaneous Q4H  . linagliptin  5 mg Oral Daily  . mometasone-formoterol  2 puff Inhalation BID  . pantoprazole  40 mg Oral Daily  . potassium chloride  10 mEq  Oral Daily  . rivaroxaban  20 mg Oral QHS  . topiramate  50 mg Oral BID  . venlafaxine XR  75 mg Oral Q breakfast   Continuous Infusions: . cefTRIAXone (ROCEPHIN)  IV 1 g (10/15/17 1253)  . dextrose 5 % and 0.9% NaCl 75 mL/hr at 10/14/17 1641     LOS: 1 day     Cordelia Poche, MD [facility] 10/15/2017, 4:51 PM Pager: [phone]  If 7PM-7AM, please contact night-coverage [email or web address] 10/15/2017, 4:51 PM

## 2017-10-16 ENCOUNTER — Inpatient Hospital Stay (HOSPITAL_COMMUNITY): Payer: Medicare Other

## 2017-10-16 DIAGNOSIS — N182 Chronic kidney disease, stage 2 (mild): Secondary | ICD-10-CM

## 2017-10-16 DIAGNOSIS — E1122 Type 2 diabetes mellitus with diabetic chronic kidney disease: Secondary | ICD-10-CM

## 2017-10-16 LAB — COMPREHENSIVE METABOLIC PANEL
ALBUMIN: 2.4 g/dL — AB (ref 3.5–5.0)
ALK PHOS: 317 U/L — AB (ref 38–126)
ALT: 191 U/L — ABNORMAL HIGH (ref 0–44)
ANION GAP: 9 (ref 5–15)
AST: 291 U/L — ABNORMAL HIGH (ref 15–41)
BUN: <5 mg/dL — ABNORMAL LOW (ref 6–20)
CALCIUM: 8.7 mg/dL — AB (ref 8.9–10.3)
CO2: 23 mmol/L (ref 22–32)
Chloride: 112 mmol/L — ABNORMAL HIGH (ref 98–111)
Creatinine, Ser: 0.77 mg/dL (ref 0.44–1.00)
GFR calc non Af Amer: >60 mL/min (ref >60)
GLUCOSE: 140 mg/dL — AB (ref 70–99)
POTASSIUM: 3 mmol/L — AB (ref 3.5–5.1)
Sodium: 144 mmol/L (ref 135–145)
Total Bilirubin: 2.2 mg/dL — ABNORMAL HIGH (ref 0.3–1.2)
Total Protein: 6.1 g/dL — ABNORMAL LOW (ref 6.5–8.1)

## 2017-10-16 LAB — URINE CULTURE: Culture: >=100000 — AB

## 2017-10-16 LAB — GLUCOSE, CAPILLARY
GLUCOSE-CAPILLARY: 133 mg/dL — AB (ref 70–99)
GLUCOSE-CAPILLARY: 101 mg/dL — AB (ref 70–99)
Glucose-Capillary: 111 mg/dL — ABNORMAL HIGH (ref 70–99)
Glucose-Capillary: 114 mg/dL — ABNORMAL HIGH (ref 70–99)
Glucose-Capillary: 126 mg/dL — ABNORMAL HIGH (ref 70–99)

## 2017-10-16 MED ORDER — DIPHENHYDRAMINE HCL 25 MG PO CAPS
25.0000 mg | ORAL_CAPSULE | Freq: Once | ORAL | Status: AC
  Administered 2017-10-16: 25 mg via ORAL
  Filled 2017-10-16: qty 1

## 2017-10-16 MED ORDER — TECHNETIUM TC 99M MEBROFENIN IV KIT
7.4900 | PACK | Freq: Once | INTRAVENOUS | Status: AC
  Administered 2017-10-16: 7.49 via INTRAVENOUS

## 2017-10-16 MED ORDER — MORPHINE SULFATE (PF) 4 MG/ML IV SOLN
4.7000 mg | Freq: Once | INTRAVENOUS | Status: AC
  Administered 2017-10-16: 4.7 mg via INTRAVENOUS
  Filled 2017-10-16: qty 2

## 2017-10-16 NOTE — Progress Notes (Signed)
Patient was off the unit during the last several hours of the shift receiving a scan and was unable to have her glucose checked and take the rest of her evening medications. Patient is still currently off the unit and this was given in report to the oncoming nightshift. Night nurse acknowledged this and stated she would follow up with the medications once the patient returned to the floor.

## 2017-10-16 NOTE — Progress Notes (Signed)
Received a call from [facility] MD, MD Melanee Spry, about a verbal order of 4.7 mg IV Morphine nuclear medicine needed to complete the patient's scan. Placed order and drew up medication. The medication was then administered to the patient by the [facility] down in Nuclear Med.

## 2017-10-16 NOTE — Progress Notes (Signed)
Paged Dr. MD advised to unclamp drain and attach bag. Patient tolerating well. Will continue to monitor.

## 2017-10-16 NOTE — Progress Notes (Signed)
Referring Physician(s): Durward Mallard  Supervising Physician: Jacqulynn Cadet  Patient Status:  [facility] - In-pt  Chief Complaint: None  Subjective:  Cholecystitis s/p percutaneous cholecystostomy placement 09/13/2017 with Dr. Laurence Ferrari. Patient awake and alert laying in bed with no complaints at this time. States she has a HIDA scan this afternoon, asks for explanation of procedure which I informed her of. Percutaneous cholecystostomy site c/d/i with minimal green bile in gravity bag.   Allergies: Belsomra [suvorexant]; Enalapril maleate; Latex; Nickel; Other; Iodine; Doxycycline; Hydrochlorothiazide; Hydroxychloroquine sulfate; Lyrica [pregabalin]; and Tape  Medications: Prior to Admission medications   Medication Sig Start Date End Date Taking? Authorizing Provider  acetaminophen (TYLENOL) 325 MG tablet Take 2 tablets (650 mg total) by mouth every 6 (six) hours as needed for mild pain, fever or headache. 09/19/17  Yes Debbe Odea, MD  augmented betamethasone dipropionate (DIPROLENE-AF) 0.05 % ointment Apply topically 2 (two) times daily. 09/30/17  Yes [provider]  budesonide-formoterol (SYMBICORT) 160-4.5 MCG/ACT inhaler Inhale 2 puffs into the lungs TWICE DAILY 120/4=30 06/10/17  Yes Rigoberto Noel, MD  carvedilol (COREG) 12.5 MG tablet Take 1 tablet (12.5 mg total) by mouth 2 (two) times daily with a meal. 06/12/17  Yes Plotnikov, Evie Lacks, MD  clonazePAM (KLONOPIN) 0.5 MG tablet Take 1-2 tablets (0.5-1 mg total) by mouth at bedtime. Patient taking differently: Take 0.5 mg by mouth at bedtime.  08/04/17  Yes Plotnikov, Evie Lacks, MD  esomeprazole (NEXIUM) 40 MG capsule Take 1 capsule (40 mg total) by mouth daily before breakfast. 08/07/17  Yes Plotnikov, Evie Lacks, MD  JANUVIA 50 MG tablet Take 1 tablet (50 mg total) by mouth daily. Patient taking differently: Take 50 mg by mouth daily.  09/10/17  Yes Philemon Kingdom, MD  metFORMIN  (GLUCOPHAGE-XR) 500 MG 24 hr tablet TAKE 2 TABLETS(1000 MG) BY MOUTH DAILY WITH BREAKFAST 06/12/17  Yes Plotnikov, Evie Lacks, MD  oxyCODONE (OXY IR/ROXICODONE) 5 MG immediate release tablet Take 1 tablet (5 mg total) by mouth every 4 (four) hours as needed for moderate pain. 09/19/17  Yes Debbe Odea, MD  polyethylene glycol (MIRALAX / GLYCOLAX) packet Take 17 g by mouth daily. 09/19/17  Yes Debbe Odea, MD  potassium chloride (K-DUR) 10 MEQ tablet Take 1 tablet (10 mEq total) by mouth daily. 09/24/17  Yes Plotnikov, Evie Lacks, MD  topiramate (TOPAMAX) 50 MG tablet Take 1 tablet (50 mg total) by mouth 2 (two) times daily. 06/12/17  Yes Plotnikov, Evie Lacks, MD  venlafaxine XR (EFFEXOR-XR) 75 MG 24 hr capsule Take 1 capsule (75 mg total) by mouth daily with breakfast. 03/04/17  Yes Plotnikov, Evie Lacks, MD  XARELTO 20 MG TABS tablet Take 1 tablet (20 mg total) by mouth at bedtime. 06/12/17  Yes Plotnikov, Evie Lacks, MD  Adalimumab (HUMIRA) 40 MG/0.8ML PSKT Inject 40 mg into the skin every 7 (seven) days.     [provider]  B-D ULTRA-FINE 33 LANCETS MISC Use as instructed to test sugars 4 times daily DX: E11.8 09/22/17   Philemon Kingdom, MD  furosemide (LASIX) 40 MG tablet TAKE 1 TABLET BY MOUTH EVERY MORNING FOR EDEMA Patient taking differently: Take 20 mg by mouth daily as needed for fluid or edema.  01/10/17   Plotnikov, Evie Lacks, MD  gabapentin (NEURONTIN) 300 MG capsule Take 1 capsule (300 mg total) by mouth 2 (two) times daily as needed (for pain). 06/12/17   Plotnikov, Evie Lacks, MD  glucose blood (EZ SMART PLUS GLUCOSE  TEST) test strip Use as instructed to test sugars 4 times daily DX: E11.8 09/22/17   Philemon Kingdom, MD  promethazine (PHENERGAN) 25 MG tablet Take 1 tablet (25 mg total) by mouth every 8 (eight) hours as needed for nausea or vomiting. Patient not taking: Reported on 10/06/2017 09/03/17   Plotnikov, Evie Lacks, MD  VENTOLIN HFA 108 (90 Base) MCG/ACT inhaler Inhale 2 puffs into  the lungs every 6 (six) hours as needed for wheezing or shortness of breath. 200/8=25 Patient taking differently: Inhale 2 puffs into the lungs every 6 (six) hours as needed for wheezing or shortness of breath.  12/11/16   Plotnikov, Evie Lacks, MD  VOLTAREN 1 % GEL Apply 2 g topically 2 (two) times daily as needed (for pain). 100/4=25 Patient taking differently: Apply 4 g topically 4 (four) times daily as needed (pain).  04/29/17   Plotnikov, Evie Lacks, MD     Vital Signs: BP (!) 162/84 (BP Location: Left Arm)   Pulse 61   Temp 98.2 F (36.8 C) (Oral)   Resp 17   Ht 5\' 7"  (1.702 m)   Wt 257 lb (116.6 kg)   SpO2 96%   BMI 40.25 kg/m   Physical Exam  Constitutional: She is oriented to person, place, and time. She appears well-developed and well-nourished. No distress.  Pulmonary/Chest: Effort normal. No respiratory distress.  Abdominal: Soft. There is no tenderness.  Percutaneous cholecystostomy site without erythema, drainage, or tenderness; minimal green bile in gravity bag; drain flushes/aspirates without resistance.  Neurological: She is alert and oriented to person, place, and time.  Skin: Skin is warm and dry.  Psychiatric: She has a normal mood and affect. Her behavior is normal. Judgment and thought content normal.  Nursing note and vitals reviewed.   Imaging: Ct Abdomen Pelvis W Contrast  Result Date: 10/13/2017 CLINICAL DATA:  Abdominal pain, recent cholecystitis, status post percutaneous cholecystostomy 09/12/2017 EXAM: CT ABDOMEN AND PELVIS WITH CONTRAST TECHNIQUE: Multidetector CT imaging of the abdomen and pelvis was performed using the standard protocol following bolus administration of intravenous contrast. CONTRAST:  144mL ISOVUE-300 IOPAMIDOL (ISOVUE-300) INJECTION 61% COMPARISON:  09/12/2017 FINDINGS: Lower chest: Similar bibasilar nodular scarring/atelectasis. No pleural or pericardial effusion. Normal heart size for age. Degenerative changes of the thoracic spine on  the right. Hepatobiliary: Mild hypoattenuation of the liver suggesting hepatic steatosis. No focal hepatic abnormality or biliary obstruction. No surrounding perihepatic free fluid, hemorrhage, or subcapsular hematoma. Interval right upper quadrant transhepatic cholecystostomy within the gallbladder. There is interval improvement in the gallbladder distension and surrounding pericholecystic fluid inflammation. Gallbladder is decompressed. Common bile duct nondilated. Pancreas: Unremarkable. No pancreatic ductal dilatation or surrounding inflammatory changes. Spleen: Normal in size without focal abnormality. Adrenals/Urinary Tract: Normal adrenal glands. Normal symmetric renal enhancement and excretion. 12 mm right renal cortical exophytic cyst as before. Ureters are symmetric and decompressed. No obstructing ureteral calculus. Bladder is underdistended. Stomach/Bowel: Negative for bowel obstruction, significant dilatation, ileus, or free air. Scattered colonic diverticulosis. Remote appendectomy noted. No fluid collection or abscess. No ascites. Vascular/Lymphatic: Aortic atherosclerosis. Negative for aneurysm or occlusive process. Mesenteric and renal vasculature appear patent. No adenopathy. Reproductive: Uterus and adnexa normal in size. Uterus is retroverted. No pelvic fluid collection, hemorrhage, abscess or hematoma. Other: No abdominal wall hernia or abnormality. No abdominopelvic ascites. Musculoskeletal: Degenerative changes of the spine. No acute osseous finding. Lower lumbar facet arthropathy noted IMPRESSION: Status post interval percutaneous transhepatic cholecystostomy. Cholecystostomy catheter in good position within the gallbladder. There is improvement in the  gallbladder distention, pericholecystic fluid and inflammation compatible with resolving cholecystitis. No biliary dilatation or obstruction Mild hepatic steatosis No fluid collection or abscess Other chronic findings as above Electronically  Signed   By: Jerilynn Mages.  Shick M.D.   On: 10/13/2017 22:23   US Abdomen Limited Ruq  Result Date: 10/14/2017 CLINICAL DATA:  Right upper quadrant pain, status post cholecystostomy EXAM: ULTRASOUND ABDOMEN LIMITED RIGHT UPPER QUADRANT COMPARISON:  CT abdomen/pelvis dated 10/13/2017 FINDINGS: Gallbladder: Mild wall thickening with layering sludge and indwelling cholecystostomy tube. No definite pericholecystic fluid. Positive sonographic Murphy's sign. Common bile duct: Diameter: 6 mm Liver: Hyperechoic hepatic parenchyma with coarse hepatic echotexture. No focal hepatic lesion is seen. Portal vein is patent on color Doppler imaging with normal direction of blood flow towards the liver. IMPRESSION: Cholecystostomy tube in satisfactory position. Associated gallbladder wall thickening with layering sludge. Hyperechoic hepatic parenchyma with coarse hepatic echotexture, suggesting hepatic steatosis. Electronically Signed   By: Julian Hy M.D.   On: 10/14/2017 01:18    Labs:  CBC: Recent Labs    09/18/17 0517 09/24/17 1520 10/13/17 1509 10/15/17 0331  WBC 6.5 7.3 7.0 5.8  HGB 9.1* 11.0* 10.5* 9.9*  HCT 30.6* 35.1* 33.7* 32.1*  PLT 556* 339.0 253 279    COAGS: Recent Labs    06/26/17 0751 09/13/17 0413 09/13/17 1211 09/13/17 2150 09/14/17 0841 10/13/17 2012  INR 0.93  --   --   --   --  1.34  APTT  --  76* 75* 67* 96*  --     BMP: Recent Labs    10/13/17 1509 10/14/17 0450 10/15/17 0331 10/16/17 0404  NA 145 142 145 144  K 4.1 3.5 3.4* 3.0*  CL 111 111 113* 112*  CO2 23 23 23 23   GLUCOSE 96 91 139* 140*  BUN 14 13 8  <5*  CALCIUM 9.5 9.1 9.1 8.7*  CREATININE 0.96 0.92 0.85 0.77  GFRNONAA >60 >60 >60 >60  GFRAA >60 >60 >60 >60    LIVER FUNCTION TESTS: Recent Labs    10/13/17 1509 10/14/17 0450 10/15/17 0331 10/16/17 0404  BILITOT 3.3* 3.1* 2.4* 2.2*  AST 190* 230* 327* 291*  ALT 116* 126* 173* 191*  ALKPHOS 305* 295* 317* 317*  PROT 7.3 6.6 6.2* 6.1*  ALBUMIN  2.9* 2.7* 2.5* 2.4*    Assessment and Plan:  Cholecystitis s/p percutaneous cholecystostomy placement 09/13/2017 with Dr. Laurence Ferrari. Percutaneous cholecystostomy stable. Patient with continued rise in LFTs-  Dr. Laurence Ferrari spoke with Dr. Lonny Prude, Dr. Laurence Ferrari recommended HIDA scan which is planned for this afternoon. Continue with Qshift flushes and monitoring of output. Will need F/U cholangiogram/drain exchange in clinic 8 weeks after discharge.   Electronically Signed: Earley Abide, PA-C 10/16/2017, 2:25 PM   I spent a total of 15 Minutes at the the patient's bedside AND on the patient's hospital floor or unit, greater than 50% of which was counseling/coordinating care for cholecystitis s/p percutaneous cholecystostomy placement.

## 2017-10-16 NOTE — Progress Notes (Signed)
PROGRESS NOTE    [name]  NOM:[id number] DOB: 28-Jul-1960 DOA: 10/13/2017 PCP: Cassandria Anger, MD   Brief Narrative: [name] is a 57 y.o. 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 She presented with abdominal pain secondary to    Assessment & Plan:   Principal Problem:   Abdominal pain Active Problems:   Sarcoidosis   Major depressive disorder, recurrent episode ([place])   Essential hypertension   Controlled type 2 diabetes mellitus with chronic kidney disease, without long-term current use of insulin (HCC)   COPD (chronic obstructive pulmonary disease) (HCC)   Cholecystitis, acute   Abnormal liver function test   Abdominal pain Secondary to capped biliary drain. Associated elevated LFTs. Radiology on board and have attached drain to gravity bag. LFTs continue to trend upwards. Cultures obtained. -Repeat CMP -Discussed with IR, will obtain HIDA  History of acute cholecystitis with sepsis S/p biliary drain. Management above.  Depression/anxiety -Continue Topamax, Klonopin and venlafaxine  PE/DVT Xarelto held on admission. Restarted.  Diabetes mellitus, type 2 -Continue Januvia ([place] inpatient) -Continue SSI  Sarcoidosis On Humira weekly as an outpatient  Essential hypertension -Continue home Coreg  COPD Chronic respiratory failure with hypoxia On 2 liters of O2 via [place]  Proteus mirabilis UTI Urinary frequency as a symptom. No dysuria. Resistant only to nitrofurantoin -Continue Ceftriaxone   DVT prophylaxis: Xarelto Code Status:   Code Status: Full Code Family Communication: None at bedside Disposition Plan: Discharge pending improvement of LFTs and results of HIDA   Consultants:   Interventional radiology  Procedures:   None  Antimicrobials:  Ceftriaxone    Subjective: No abdominal pain, nausea, or vomiting.  Objective: Vitals:   10/16/17 0448 10/16/17 0903 10/16/17 1128 10/16/17 1327  BP: 134/71 (!) 147/80  (!) 162/84  Pulse: 68 68  61  Resp: 16   17  Temp: 98 F (36.7 C)   98.2 F (36.8 C)  TempSrc: Oral   Oral  SpO2: 97%  96% 96%  Weight:      Height:        Intake/Output Summary (Last 24 hours) at 10/16/2017 1454 Last data filed at 10/16/2017 1327 Gross per 24 hour  Intake 2489.52 ml  Output 320 ml  Net 2169.52 ml   Filed Weights   10/13/17 1458  Weight: 116.6 kg    Examination:  General exam: Appears calm and comfortable Respiratory system: Clear to auscultation. Respiratory effort normal. Cardiovascular system: S1 & S2 heard, RRR. No murmurs, rubs, gallops or clicks. Gastrointestinal system: Abdomen is nondistended, soft and nontender. Normal bowel sounds heard. Central nervous system: Alert and oriented. No focal neurological deficits. Extremities: No edema. No calf tenderness Skin: No cyanosis. No rashes Psychiatry: Judgement and insight appear normal. Mood & affect appropriate.      Data Reviewed: I have personally reviewed following labs and imaging studies  CBC: Recent Labs  Lab 10/13/17 1509 10/15/17 0331  WBC 7.0 5.8  NEUTROABS 3.2  --   HGB 10.5* 9.9*  HCT 33.7* 32.1*  MCV 91.1 91.5  PLT 253 962   Basic Metabolic Panel: Recent Labs  Lab 10/13/17 1509 10/14/17 0450 10/15/17 0331 10/16/17 0404  NA 145 142 145 144  K 4.1 3.5 3.4* 3.0*  CL 111 111 113* 112*  CO2 23 23 23 23   GLUCOSE 96 91 139* 140*  BUN 14 13 8  <5*  CREATININE 0.96 0.92 0.85 0.77  CALCIUM 9.5 9.1 9.1 8.7*  MG  --   --  2.0  --   PHOS  --   --  3.0  --    GFR: Estimated Creatinine Clearance: 102.4 mL/min (by C-G formula based on SCr of 0.77 mg/dL). Liver Function Tests: Recent Labs  Lab 10/13/17 1509 10/14/17 0450 10/15/17 0331 10/16/17 0404  AST 190* 230* 327* 291*  ALT 116* 126* 173* 191*  ALKPHOS 305* 295* 317* 317*  BILITOT 3.3* 3.1* 2.4* 2.2*  PROT 7.3 6.6 6.2* 6.1*  ALBUMIN 2.9*  2.7* 2.5* 2.4*   Recent Labs  Lab 10/13/17 1509  LIPASE 33   No results for input(s): AMMONIA in the last 168 hours. Coagulation Profile: Recent Labs  Lab 10/13/17 2012  INR 1.34   Cardiac Enzymes: No results for input(s): CKTOTAL, CKMB, CKMBINDEX, TROPONINI in the last 168 hours. BNP (last 3 results) Recent Labs    11/11/16 1051  PROBNP 194.0*   HbA1C: No results for input(s): HGBA1C in the last 72 hours. CBG: Recent Labs  Lab 10/15/17 1942 10/16/17 0004 10/16/17 0450 10/16/17 0744 10/16/17 1150  GLUCAP 136* 114* 126* 111* 133*   Lipid Profile: No results for input(s): CHOL, HDL, LDLCALC, TRIG, CHOLHDL, LDLDIRECT in the last 72 hours. Thyroid Function Tests: No results for input(s): TSH, T4TOTAL, FREET4, T3FREE, THYROIDAB in the last 72 hours. Anemia Panel: No results for input(s): VITAMINB12, FOLATE, FERRITIN, TIBC, IRON, RETICCTPCT in the last 72 hours. Sepsis Labs: Recent Labs  Lab 10/14/17 1712  PROCALCITON 0.23  LATICACIDVEN 1.0    Recent Results (from the past 240 hour(s))  Urine culture     Status: Abnormal   Collection Time: 10/13/17  9:01 PM  Result Value Ref Range Status   Specimen Description   Final    URINE, CLEAN CATCH Performed at [facility] [address] [place], [place] [address]    Special Requests   Final    NONE Performed at [facility], [place] [address] [place], [place] [address]    Culture >=100,000 COLONIES/mL PROTEUS MIRABILIS (A)  Final   Report Status 10/16/2017 FINAL  Final   Organism ID, Bacteria PROTEUS MIRABILIS (A)  Final      Susceptibility   Proteus mirabilis - MIC*    AMPICILLIN <=2 SENSITIVE Sensitive     CEFAZOLIN <=4 SENSITIVE Sensitive     CEFTRIAXONE <=1 SENSITIVE Sensitive     CIPROFLOXACIN <=0.25 SENSITIVE Sensitive     GENTAMICIN <=1 SENSITIVE Sensitive     IMIPENEM 2 SENSITIVE Sensitive     NITROFURANTOIN 128 RESISTANT Resistant     TRIMETH/SULFA <=20 SENSITIVE Sensitive       AMPICILLIN/SULBACTAM <=2 SENSITIVE Sensitive     PIP/TAZO <=4 SENSITIVE Sensitive     * >=100,000 COLONIES/mL PROTEUS MIRABILIS  Aerobic Culture (superficial specimen)     Status: None (Preliminary result)   Collection Time: 10/15/17 12:21 PM  Result Value Ref Range Status   Specimen Description   Final    BILE Performed at [facility] [address], [place], [place] [address]    Special Requests   Final    NONE Performed at [facility], [place] [address] [place], [place] [address]    Gram Stain   Final    NO WBC SEEN RARE GRAM POSITIVE COCCI Performed at [facility], [place] [address] [place], [place] [address]    Culture FEW STAPHYLOCOCCUS AUREUS  Final   Report Status PENDING  Incomplete  Radiology Studies: No results found.      Scheduled Meds: . carvedilol  12.5 mg Oral BID WC  . clonazePAM  0.5 mg Oral QHS  . insulin aspart  0-9 Units Subcutaneous Q4H  . linagliptin  5 mg Oral Daily  . mometasone-formoterol  2 puff Inhalation BID  . pantoprazole  40 mg Oral Daily  . potassium chloride  10 mEq Oral Daily  . rivaroxaban  20 mg Oral QHS  . topiramate  50 mg Oral BID  . venlafaxine XR  75 mg Oral Q breakfast   Continuous Infusions: . cefTRIAXone (ROCEPHIN)  IV 1 g (10/16/17 1308)  . dextrose 5 % and 0.9% NaCl 75 mL/hr at 10/15/17 2021     LOS: 2 days     Cordelia Poche, MD [facility] 10/16/2017, 2:54 PM Pager: [phone]  If 7PM-7AM, please contact night-coverage [email or web address] 10/16/2017, 2:54 PM

## 2017-10-16 NOTE — Progress Notes (Signed)
Patient back to floor from HIDA scan. BGM and vitals checked. Patient reports no pain but states she is itchy from the morphine. Food and drink provided. Will continue to monitor.

## 2017-10-16 NOTE — Care Management Note (Signed)
Case Management Note  Patient Details  Name: [name] MRN: [id number] Date of Birth: 1960-05-22  Subjective/Objective:   Patient active with [facility] prior to admission, services to resume at d/c.                 Action/Plan: [facility] aware of admission  Expected Discharge Date:                  Expected Discharge Plan:  [place]  In-House Referral:  NA  Discharge planning Services  CM Consult  Post Acute Care Choice:  Home Health Choice offered to:  Patient  DME Arranged:  N/A DME Agency:  NA  HH Arranged:  RN, PT [place] Agency:  [facility]  Status of Service:  Completed, signed off  If discussed at [place] of Stay Meetings, dates discussed:    Additional Comments:  Guadalupe Maple, RN 10/16/2017, 3:23 PM

## 2017-10-16 NOTE — Consult Note (Signed)
   [facility] Inpatient Consult   10/16/2017  [name] [facility] 03-24-1960 [id number]    Patient active with [place] Management program. She is followed by [facility]. Please see chart review tab the encounters for patient outreach details.   Went to bedside to speak with [name] about ongoing [place] Management services. She remains agreeable and aware that [place] will follow up with her post hospital discharge.  She reports she active with [facility] home health. [facility] Care Management will not interfere or replace services provided by home health.  Will make inpatient RNCM aware [facility] is active.   Angela Rolling, MSN-Ed, RN,BSN [facility] Liaison [phone]

## 2017-10-17 LAB — GLUCOSE, CAPILLARY
GLUCOSE-CAPILLARY: 97 mg/dL (ref 70–99)
GLUCOSE-CAPILLARY: 119 mg/dL — AB (ref 70–99)
Glucose-Capillary: 111 mg/dL — ABNORMAL HIGH (ref 70–99)
Glucose-Capillary: 115 mg/dL — ABNORMAL HIGH (ref 70–99)

## 2017-10-17 LAB — COMPREHENSIVE METABOLIC PANEL
ALBUMIN: 2.4 g/dL — AB (ref 3.5–5.0)
ALT: 174 U/L — AB (ref 0–44)
ANION GAP: 11 (ref 5–15)
AST: 203 U/L — ABNORMAL HIGH (ref 15–41)
Alkaline Phosphatase: 297 U/L — ABNORMAL HIGH (ref 38–126)
BILIRUBIN TOTAL: 1.8 mg/dL — AB (ref 0.3–1.2)
CALCIUM: 9 mg/dL (ref 8.9–10.3)
CO2: 24 mmol/L (ref 22–32)
CREATININE: 0.9 mg/dL (ref 0.44–1.00)
Chloride: 111 mmol/L (ref 98–111)
GFR calc Af Amer: >60 mL/min (ref >60)
GFR calc non Af Amer: >60 mL/min (ref >60)
GLUCOSE: 118 mg/dL — AB (ref 70–99)
Potassium: 3 mmol/L — ABNORMAL LOW (ref 3.5–5.1)
Sodium: 146 mmol/L — ABNORMAL HIGH (ref 135–145)
TOTAL PROTEIN: 6.1 g/dL — AB (ref 6.5–8.1)

## 2017-10-17 MED ORDER — METFORMIN HCL ER 500 MG PO TB24: ORAL_TABLET | ORAL | Status: DC

## 2017-10-17 MED ORDER — CEFDINIR 300 MG PO CAPS: 300.0000 mg | ORAL_CAPSULE | Freq: Two times a day (BID) | ORAL | 0 refills | Status: AC

## 2017-10-17 NOTE — Progress Notes (Signed)
Patient has discharged to home on 10/17/17. Discharge instruction including medication and appointment was given to patient. RN educated patient how to flush IJ drain and how to measure output. Patient verbalized understand and had no question at this time.

## 2017-10-17 NOTE — Care Management Important Message (Signed)
Important Message  Patient Details  Name: JACQLYN MAROLF MRN: [id number] Date of Birth: 12-29-1960   Medicare Important Message Given:  Yes    Kerin Salen 10/17/2017, 10:31 AMImportant Message  Patient Details  Name: DEEKSHA COTRELL MRN: [id number] Date of Birth: [birth year]   Medicare Important Message Given:  Yes    Kerin Salen 10/17/2017, 10:31 AM

## 2017-10-17 NOTE — Progress Notes (Addendum)
Referring Physician(s): Hall,C/Nandigam,K  Supervising Physician: Sandi Mariscal  Patient Status:  [facility] - In-pt  Chief Complaint: Abdominal pain   Subjective: Pt doing ok today; ready to go home; denies N/V or worsening abd pain   Allergies: Belsomra [suvorexant]; Enalapril maleate; Latex; Nickel; Other; Iodine; Doxycycline; Hydrochlorothiazide; Hydroxychloroquine sulfate; Lyrica [pregabalin]; and Tape  Medications: Prior to Admission medications   Medication Sig Start Date End Date Taking? Authorizing Provider  acetaminophen (TYLENOL) 325 MG tablet Take 2 tablets (650 mg total) by mouth every 6 (six) hours as needed for mild pain, fever or headache. 09/19/17  Yes Debbe Odea, MD  augmented betamethasone dipropionate (DIPROLENE-AF) 0.05 % ointment Apply topically 2 (two) times daily. 09/30/17  Yes [provider]  budesonide-formoterol (SYMBICORT) 160-4.5 MCG/ACT inhaler Inhale 2 puffs into the lungs TWICE DAILY 120/4=30 06/10/17  Yes Rigoberto Noel, MD  carvedilol (COREG) 12.5 MG tablet Take 1 tablet (12.5 mg total) by mouth 2 (two) times daily with a meal. 06/12/17  Yes Plotnikov, Evie Lacks, MD  clonazePAM (KLONOPIN) 0.5 MG tablet Take 1-2 tablets (0.5-1 mg total) by mouth at bedtime. Patient taking differently: Take 0.5 mg by mouth at bedtime.  08/04/17  Yes Plotnikov, Evie Lacks, MD  esomeprazole (NEXIUM) 40 MG capsule Take 1 capsule (40 mg total) by mouth daily before breakfast. 08/07/17  Yes Plotnikov, Evie Lacks, MD  JANUVIA 50 MG tablet Take 1 tablet (50 mg total) by mouth daily. Patient taking differently: Take 50 mg by mouth daily.  09/10/17  Yes Philemon Kingdom, MD  oxyCODONE (OXY IR/ROXICODONE) 5 MG immediate release tablet Take 1 tablet (5 mg total) by mouth every 4 (four) hours as needed for moderate pain. 09/19/17  Yes Debbe Odea, MD  polyethylene glycol (MIRALAX / GLYCOLAX) packet Take 17 g by mouth daily. 09/19/17  Yes Debbe Odea, MD  potassium chloride (K-DUR)  10 MEQ tablet Take 1 tablet (10 mEq total) by mouth daily. 09/24/17  Yes Plotnikov, Evie Lacks, MD  topiramate (TOPAMAX) 50 MG tablet Take 1 tablet (50 mg total) by mouth 2 (two) times daily. 06/12/17  Yes Plotnikov, Evie Lacks, MD  venlafaxine XR (EFFEXOR-XR) 75 MG 24 hr capsule Take 1 capsule (75 mg total) by mouth daily with breakfast. 03/04/17  Yes Plotnikov, Evie Lacks, MD  XARELTO 20 MG TABS tablet Take 1 tablet (20 mg total) by mouth at bedtime. 06/12/17  Yes Plotnikov, Evie Lacks, MD  Adalimumab (HUMIRA) 40 MG/0.8ML PSKT Inject 40 mg into the skin every 7 (seven) days.     [provider]  B-D ULTRA-FINE 33 LANCETS MISC Use as instructed to test sugars 4 times daily DX: E11.8 09/22/17   Philemon Kingdom, MD  cefdinir (OMNICEF) 300 MG capsule Take 1 capsule (300 mg total) by mouth 2 (two) times daily for 8 days. 10/18/17 10/26/17  Mariel Aloe, MD  furosemide (LASIX) 40 MG tablet TAKE 1 TABLET BY MOUTH EVERY MORNING FOR EDEMA Patient taking differently: Take 20 mg by mouth daily as needed for fluid or edema.  01/10/17   Plotnikov, Evie Lacks, MD  gabapentin (NEURONTIN) 300 MG capsule Take 1 capsule (300 mg total) by mouth 2 (two) times daily as needed (for pain). 06/12/17   Plotnikov, Evie Lacks, MD  glucose blood (EZ SMART PLUS GLUCOSE TEST) test strip Use as instructed to test sugars 4 times daily DX: E11.8 09/22/17   Philemon Kingdom, MD  metFORMIN (GLUCOPHAGE-XR) 500 MG 24 hr tablet TAKE 2 TABLETS(1000 MG) BY MOUTH DAILY WITH BREAKFAST  10/19/17   Mariel Aloe, MD  promethazine (PHENERGAN) 25 MG tablet Take 1 tablet (25 mg total) by mouth every 8 (eight) hours as needed for nausea or vomiting. Patient not taking: Reported on 10/06/2017 09/03/17   Plotnikov, Evie Lacks, MD  VENTOLIN HFA 108 (90 Base) MCG/ACT inhaler Inhale 2 puffs into the lungs every 6 (six) hours as needed for wheezing or shortness of breath. 200/8=25 Patient taking differently: Inhale 2 puffs into the lungs every 6 (six)  hours as needed for wheezing or shortness of breath.  12/11/16   Plotnikov, Evie Lacks, MD  VOLTAREN 1 % GEL Apply 2 g topically 2 (two) times daily as needed (for pain). 100/4=25 Patient taking differently: Apply 4 g topically 4 (four) times daily as needed (pain).  04/29/17   Plotnikov, Evie Lacks, MD     Vital Signs: BP 135/61 (BP Location: Left Arm)   Pulse 67   Temp 98.3 F (36.8 C) (Oral)   Resp 16   Ht 5\' 7"  (1.702 m)   Wt 257 lb (116.6 kg)   SpO2 97%   BMI 40.25 kg/m   Physical Exam GB drain intact, insertion site ok, mildly tender, output 100 cc yesterday, 20 cc today; drain flushed without resistance  Imaging: Nm Hepatobiliary Liver Func  Result Date: 10/16/2017 CLINICAL DATA:  Acute cholecystitis, status post cholecystostomy, inflammation on CT/ultrasound EXAM: NUCLEAR MEDICINE HEPATOBILIARY IMAGING TECHNIQUE: Sequential images of the abdomen were obtained out to 60 minutes following intravenous administration of radiopharmaceutical. RADIOPHARMACEUTICALS:  7.49 mCi Tc-32m  Choletec IV COMPARISON:  Right upper quadrant ultrasound dated 10/14/2017. CT abdomen/pelvis dated 10/13/2017. FINDINGS: Prompt uptake and biliary excretion of activity by the liver is seen. Biliary activity passes into small bowel, consistent with patent common bile duct. Following morphine administration, gallbladder activity is visualized, consistent with patency of cystic duct. IMPRESSION: Patent cystic and common duct in this patient status post cholecystostomy for acute cholecystitis. Electronically Signed   By: Julian Hy M.D.   On: 10/16/2017 23:03   Ct Abdomen Pelvis W Contrast  Result Date: 10/13/2017 CLINICAL DATA:  Abdominal pain, recent cholecystitis, status post percutaneous cholecystostomy 09/12/2017 EXAM: CT ABDOMEN AND PELVIS WITH CONTRAST TECHNIQUE: Multidetector CT imaging of the abdomen and pelvis was performed using the standard protocol following bolus administration of intravenous  contrast. CONTRAST:  127mL ISOVUE-300 IOPAMIDOL (ISOVUE-300) INJECTION 61% COMPARISON:  09/12/2017 FINDINGS: Lower chest: Similar bibasilar nodular scarring/atelectasis. No pleural or pericardial effusion. Normal heart size for age. Degenerative changes of the thoracic spine on the right. Hepatobiliary: Mild hypoattenuation of the liver suggesting hepatic steatosis. No focal hepatic abnormality or biliary obstruction. No surrounding perihepatic free fluid, hemorrhage, or subcapsular hematoma. Interval right upper quadrant transhepatic cholecystostomy within the gallbladder. There is interval improvement in the gallbladder distension and surrounding pericholecystic fluid inflammation. Gallbladder is decompressed. Common bile duct nondilated. Pancreas: Unremarkable. No pancreatic ductal dilatation or surrounding inflammatory changes. Spleen: Normal in size without focal abnormality. Adrenals/Urinary Tract: Normal adrenal glands. Normal symmetric renal enhancement and excretion. 12 mm right renal cortical exophytic cyst as before. Ureters are symmetric and decompressed. No obstructing ureteral calculus. Bladder is underdistended. Stomach/Bowel: Negative for bowel obstruction, significant dilatation, ileus, or free air. Scattered colonic diverticulosis. Remote appendectomy noted. No fluid collection or abscess. No ascites. Vascular/Lymphatic: Aortic atherosclerosis. Negative for aneurysm or occlusive process. Mesenteric and renal vasculature appear patent. No adenopathy. Reproductive: Uterus and adnexa normal in size. Uterus is retroverted. No pelvic fluid collection, hemorrhage, abscess or hematoma. Other: No abdominal wall  hernia or abnormality. No abdominopelvic ascites. Musculoskeletal: Degenerative changes of the spine. No acute osseous finding. Lower lumbar facet arthropathy noted IMPRESSION: Status post interval percutaneous transhepatic cholecystostomy. Cholecystostomy catheter in good position within the  gallbladder. There is improvement in the gallbladder distention, pericholecystic fluid and inflammation compatible with resolving cholecystitis. No biliary dilatation or obstruction Mild hepatic steatosis No fluid collection or abscess Other chronic findings as above Electronically Signed   By: Jerilynn Mages.  Shick M.D.   On: 10/13/2017 22:23   US Abdomen Limited Ruq  Result Date: 10/14/2017 CLINICAL DATA:  Right upper quadrant pain, status post cholecystostomy EXAM: ULTRASOUND ABDOMEN LIMITED RIGHT UPPER QUADRANT COMPARISON:  CT abdomen/pelvis dated 10/13/2017 FINDINGS: Gallbladder: Mild wall thickening with layering sludge and indwelling cholecystostomy tube. No definite pericholecystic fluid. Positive sonographic Murphy's sign. Common bile duct: Diameter: 6 mm Liver: Hyperechoic hepatic parenchyma with coarse hepatic echotexture. No focal hepatic lesion is seen. Portal vein is patent on color Doppler imaging with normal direction of blood flow towards the liver. IMPRESSION: Cholecystostomy tube in satisfactory position. Associated gallbladder wall thickening with layering sludge. Hyperechoic hepatic parenchyma with coarse hepatic echotexture, suggesting hepatic steatosis. Electronically Signed   By: Julian Hy M.D.   On: 10/14/2017 01:18    Labs:  CBC: Recent Labs    09/18/17 0517 09/24/17 1520 10/13/17 1509 10/15/17 0331  WBC 6.5 7.3 7.0 5.8  HGB 9.1* 11.0* 10.5* 9.9*  HCT 30.6* 35.1* 33.7* 32.1*  PLT 556* 339.0 253 279    COAGS: Recent Labs    06/26/17 0751 09/13/17 0413 09/13/17 1211 09/13/17 2150 09/14/17 0841 10/13/17 2012  INR 0.93  --   --   --   --  1.34  APTT  --  76* 75* 67* 96*  --     BMP: Recent Labs    10/14/17 0450 10/15/17 0331 10/16/17 0404 10/17/17 0436  NA 142 145 144 146*  K 3.5 3.4* 3.0* 3.0*  CL 111 113* 112* 111  CO2 23 23 23 24   GLUCOSE 91 139* 140* 118*  BUN 13 8 <5* <5*  CALCIUM 9.1 9.1 8.7* 9.0  CREATININE 0.92 0.85 0.77 0.90  GFRNONAA >60  >60 >60 >60  GFRAA >60 >60 >60 >60    LIVER FUNCTION TESTS: Recent Labs    10/14/17 0450 10/15/17 0331 10/16/17 0404 10/17/17 0436  BILITOT 3.1* 2.4* 2.2* 1.8*  AST 230* 327* 291* 203*  ALT 126* 173* 191* 174*  ALKPHOS 295* 317* 317* 297*  PROT 6.6 6.2* 6.1* 6.1*  ALBUMIN 2.7* 2.5* 2.4* 2.4*    Assessment and Plan: Pt with hx cholecystitis, s/p perc GB drain placement 09/13/17 (poor surgical candidate); drain capped 10/08/17 due to patent cystic duct/CBD on cholangiogram same day; now with increasing abd pain, rise in LFT's, dysuria; drain placed back to gravity 10/1; afebrile; K 3.0- replace; creat nl; LFT's down slightly from yesterday; HIDA scan nl; bile cx- few staph aureus; continue once daily flushing of drain with 5 cc sterile NS as OP along with every 1-2 day dressing changes, output recording; pt given prescription for saline flushes, output recording cards and contact numbers for radiology;  keep originally planned OP appt at IR drain clinic for 10/9 at 11am   Electronically Signed: D. Rowe Robert, PA-C 10/17/2017, 3:27 PM   I spent a total of 15 minutes at the the patient's bedside AND on the patient's hospital floor or unit, greater than 50% of which was counseling/coordinating care for gallbladder drain    Patient ID:  [name], female   DOB: 02-Oct-1960, 57 y.o.   MRN: [id number]

## 2017-10-17 NOTE — Discharge Instructions (Addendum)
[place],  You were admitted because of your abdominal pain. Your drain has been attached back to a bag. You will need to follow-up with the interventional radiologists at their office.

## 2017-10-17 NOTE — Discharge Summary (Signed)
Physician Discharge Summary  [name] YWV:[id number] DOB: 1960/02/29 DOA: 10/13/2017  PCP: Cassandria Anger, MD  Admit date: 10/13/2017 Discharge date: 10/17/2017  Admitted From: Home Disposition: Home  Recommendations for Outpatient Follow-up:  1. Follow up with PCP in 1 week 2. Follow up with interventional radiology on 10/22/17 3. Please obtain BMP/CBC in one week 4. Please follow up on the following pending results: [place]: None Equipment/Devices: Cholecystostomy  Discharge Condition: Stable CODE STATUS: Full code Diet recommendation: Carb modified   Brief/Interim Summary:  Admission HPI written by Bethena Roys, MD   Chief Complaint: Abdominal pain, lethargy  HPI: [name] is a 57 y.o. 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  Patient presented to the ED with complaints of right-sided abdominal pain of 3 days duration.  Poor appetite weakness and feeling lethargic, and passing dark urine over the past few days also.  She denies fever or chills.  Had only one episode of loose stool earlier in the day.  She also reports urinary frequency without dysuria or lower abdominal pain.  Recent hospital admission 8/30- 09/19/17 sepsis 2/2 acute cholecystitis with possible intrahepatic and pericholecystic abscesses.  Patient evaluated by general surgery and told decided that due to her pulmonary sarcoidosis she is not a good candidate for surgery.  IR placed percutaneous drain 8/31. ID was also consulted inpatient with recommendations for Bactrim for 2 weeks.  He had followed up with IR, 10/08/17, with minimal bilious output from drain, patient's drain was capped to follow-up in 2 weeks.  Patient reports since drain was removed she initially did well until the past 3 days.  ED Course: Stable vitals.  WBC 7.  Elevated liver enzymes-AST 190 ALT 116 ALP 305  total bili 3.3 UA moderate leukocytes rare bacteria.  CT abdomen and pelvis with contrast-status post interval percutaneous transhepatic cholecystostomy.  Cholecystostomy catheter in good position within the gallbladder.  Improvement in gallbladder distention pericholecystic fluid, resolving cholecystitis. 1L bolus given. GI was consulted, recommended right upper quadrant ultrasound they will see in a.m. will likely need IR involvement also, start IV antibiotics if fever.    Hospital course:  Abdominal pain Secondary to capped biliary drain. Associated elevated LFTs. Radiology on board and have attached drain to gravity bag. LFTs continued to trend upwards. HIDA obtained which showed clear ducts. Prior to discharge, AST, ALT, alkaline phosphatase trending down. Patient asymptomatic. Follow up with interventional radiology as an outpatient.  History of acute cholecystitis with sepsis S/p biliary drain. Management above.  Depression/anxiety Continue Topamax, Klonopin and venlafaxine  PE/DVT Xarelto held on admission. Restarted. Continue on discharge.  Diabetes mellitus, type 2 Continue Januvia and resume metformin.  Sarcoidosis On Humira weekly as an outpatient  Essential hypertension Continue home Coreg  COPD Chronic respiratory failure with hypoxia On 2 liters of O2 via [place]  Proteus mirabilis UTI Urinary frequency as a symptom. No dysuria. Resistant only to nitrofurantoin. Empirically treated with ceftriaxone. Transitioned to Cefdinir on discharge.  Discharge Diagnoses:  Principal Problem:   Abdominal pain Active Problems:   Sarcoidosis   Major depressive disorder, recurrent episode ([place])   Essential hypertension   Obesity, Class III, BMI 40-49.9 (morbid obesity) ([place])   Controlled type 2 diabetes mellitus with chronic kidney disease, without long-term current use of insulin (HCC)   COPD (chronic obstructive pulmonary disease) (HCC)   Cholecystitis, acute    Abnormal liver function test  Discharge Instructions   Allergies as of 10/17/2017      Reactions   Belsomra [suvorexant] Other (See Comments)   [place] out of bed while asleep: "I'm waking up as I'm falling on the floor;" "Night terrors"   Enalapril Maleate Cough   Latex Hives   Nickel Other (See Comments)   Blisters  Pt has a titanium right and left knee - nickel causes skin irritations that form into blisters and sores   Other Other (See Comments)   Patient has Sarcoidosis and can't tolerate any metals   Iodine Other (See Comments)   Patient received IV contrast without pre-meds without any problems 09/12/17 kwl Mother was intolerant--CODED on CT table---pt never tried   Doxycycline Diarrhea, Nausea And Vomiting   Hydrochlorothiazide Other (See Comments)   Low potassium levels    Hydroxychloroquine Sulfate Other (See Comments)   Vision changes    Lyrica [pregabalin] Other (See Comments)   "Made me feel high"   Tape Other (See Comments)   Medical tape causes bruising      Medication List    STOP taking these medications   promethazine 25 MG tablet Commonly known as:  PHENERGAN     TAKE these medications   acetaminophen 325 MG tablet Commonly known as:  TYLENOL Take 2 tablets (650 mg total) by mouth every 6 (six) hours as needed for mild pain, fever or headache.   augmented betamethasone dipropionate 0.05 % ointment Commonly known as:  DIPROLENE-AF Apply topically 2 (two) times daily.   B-D ULTRA-FINE 33 LANCETS Misc Use as instructed to test sugars 4 times daily DX: E11.8   budesonide-formoterol 160-4.5 MCG/ACT inhaler Commonly known as:  SYMBICORT Inhale 2 puffs into the lungs TWICE DAILY 120/4=30   carvedilol 12.5 MG tablet Commonly known as:  COREG Take 1 tablet (12.5 mg total) by mouth 2 (two) times daily with a meal.   cefdinir 300 MG capsule Commonly known as:  OMNICEF Take 1 capsule (300 mg total) by mouth 2 (two) times daily for 8 days. Start taking  on:  10/18/2017   clonazePAM 0.5 MG tablet Commonly known as:  KLONOPIN Take 1-2 tablets (0.5-1 mg total) by mouth at bedtime. What changed:  how much to take   esomeprazole 40 MG capsule Commonly known as:  NEXIUM Take 1 capsule (40 mg total) by mouth daily before breakfast.   furosemide 40 MG tablet Commonly known as:  LASIX TAKE 1 TABLET BY MOUTH EVERY MORNING FOR EDEMA What changed:  See the new instructions.   gabapentin 300 MG capsule Commonly known as:  NEURONTIN Take 1 capsule (300 mg total) by mouth 2 (two) times daily as needed (for pain).   glucose blood test strip Use as instructed to test sugars 4 times daily DX: E11.8   HUMIRA 40 MG/0.8ML Pskt Generic drug:  Adalimumab Inject 40 mg into the skin every 7 (seven) days.   JANUVIA 50 MG tablet Generic drug:  sitaGLIPtin Take 1 tablet (50 mg total) by mouth daily. What changed:  See the new instructions.   metFORMIN 500 MG 24 hr tablet Commonly known as:  GLUCOPHAGE-XR TAKE 2 TABLETS(1000 MG) BY MOUTH DAILY WITH BREAKFAST Start taking on:  10/19/2017 What changed:  These instructions start on 10/19/2017. If you are unsure what to do until then, ask your doctor or other care provider.   oxyCODONE 5 MG immediate release tablet Commonly known as:  Oxy IR/ROXICODONE Take 1 tablet (5 mg total) by mouth every 4 (four) hours as  needed for moderate pain.   polyethylene glycol packet Commonly known as:  MIRALAX / GLYCOLAX Take 17 g by mouth daily.   potassium chloride 10 MEQ tablet Commonly known as:  K-DUR Take 1 tablet (10 mEq total) by mouth daily.   topiramate 50 MG tablet Commonly known as:  TOPAMAX Take 1 tablet (50 mg total) by mouth 2 (two) times daily.   venlafaxine XR 75 MG 24 hr capsule Commonly known as:  EFFEXOR-XR Take 1 capsule (75 mg total) by mouth daily with breakfast.   VENTOLIN HFA 108 (90 Base) MCG/ACT inhaler Generic drug:  albuterol Inhale 2 puffs into the lungs every 6 (six) hours as  needed for wheezing or shortness of breath. 200/8=25 What changed:  See the new instructions.   VOLTAREN 1 % Gel Generic drug:  diclofenac sodium Apply 2 g topically 2 (two) times daily as needed (for pain). 100/4=25 What changed:  See the new instructions.   XARELTO 20 MG Tabs tablet Generic drug:  rivaroxaban Take 1 tablet (20 mg total) by mouth at bedtime.      Follow-up Information    Plotnikov, Evie Lacks, MD. Schedule an appointment as soon as possible for a visit in 1 week(s).   Specialty:  Internal Medicine Contact information: [place] [address] [phone]          Allergies  Allergen Reactions  . Belsomra [Suvorexant] Other (See Comments)    [place] out of bed while asleep: "I'm waking up as I'm falling on the floor;" "Night terrors"  . Enalapril Maleate Cough  . Latex Hives  . Nickel Other (See Comments)    Blisters  Pt has a titanium right and left knee - nickel causes skin irritations that form into blisters and sores  . Other Other (See Comments)    Patient has Sarcoidosis and can't tolerate any metals  . Iodine Other (See Comments)    Patient received IV contrast without pre-meds without any problems 09/12/17 kwl Mother was intolerant--CODED on CT table---pt never tried  . Doxycycline Diarrhea and Nausea And Vomiting  . Hydrochlorothiazide Other (See Comments)    Low potassium levels   . Hydroxychloroquine Sulfate Other (See Comments)    Vision changes   . Lyrica [Pregabalin] Other (See Comments)    "Made me feel high"  . Tape Other (See Comments)    Medical tape causes bruising    Consultations:  Interventional radiology   Procedures/Studies: Nm Hepatobiliary Liver Func  Result Date: 10/16/2017 CLINICAL DATA:  Acute cholecystitis, status post cholecystostomy, inflammation on CT/ultrasound EXAM: NUCLEAR MEDICINE HEPATOBILIARY IMAGING TECHNIQUE: Sequential images of the abdomen were obtained out to 60 minutes following intravenous  administration of radiopharmaceutical. RADIOPHARMACEUTICALS:  7.49 mCi Tc-61m  Choletec IV COMPARISON:  Right upper quadrant ultrasound dated 10/14/2017. CT abdomen/pelvis dated 10/13/2017. FINDINGS: Prompt uptake and biliary excretion of activity by the liver is seen. Biliary activity passes into small bowel, consistent with patent common bile duct. Following morphine administration, gallbladder activity is visualized, consistent with patency of cystic duct. IMPRESSION: Patent cystic and common duct in this patient status post cholecystostomy for acute cholecystitis. Electronically Signed   By: Julian Hy M.D.   On: 10/16/2017 23:03   Ct Abdomen Pelvis W Contrast  Result Date: 10/13/2017 CLINICAL DATA:  Abdominal pain, recent cholecystitis, status post percutaneous cholecystostomy 09/12/2017 EXAM: CT ABDOMEN AND PELVIS WITH CONTRAST TECHNIQUE: Multidetector CT imaging of the abdomen and pelvis was performed using the standard protocol following bolus administration of intravenous  contrast. CONTRAST:  144mL ISOVUE-300 IOPAMIDOL (ISOVUE-300) INJECTION 61% COMPARISON:  09/12/2017 FINDINGS: Lower chest: Similar bibasilar nodular scarring/atelectasis. No pleural or pericardial effusion. Normal heart size for age. Degenerative changes of the thoracic spine on the right. Hepatobiliary: Mild hypoattenuation of the liver suggesting hepatic steatosis. No focal hepatic abnormality or biliary obstruction. No surrounding perihepatic free fluid, hemorrhage, or subcapsular hematoma. Interval right upper quadrant transhepatic cholecystostomy within the gallbladder. There is interval improvement in the gallbladder distension and surrounding pericholecystic fluid inflammation. Gallbladder is decompressed. Common bile duct nondilated. Pancreas: Unremarkable. No pancreatic ductal dilatation or surrounding inflammatory changes. Spleen: Normal in size without focal abnormality. Adrenals/Urinary Tract: Normal adrenal glands.  Normal symmetric renal enhancement and excretion. 12 mm right renal cortical exophytic cyst as before. Ureters are symmetric and decompressed. No obstructing ureteral calculus. Bladder is underdistended. Stomach/Bowel: Negative for bowel obstruction, significant dilatation, ileus, or free air. Scattered colonic diverticulosis. Remote appendectomy noted. No fluid collection or abscess. No ascites. Vascular/Lymphatic: Aortic atherosclerosis. Negative for aneurysm or occlusive process. Mesenteric and renal vasculature appear patent. No adenopathy. Reproductive: Uterus and adnexa normal in size. Uterus is retroverted. No pelvic fluid collection, hemorrhage, abscess or hematoma. Other: No abdominal wall hernia or abnormality. No abdominopelvic ascites. Musculoskeletal: Degenerative changes of the spine. No acute osseous finding. Lower lumbar facet arthropathy noted IMPRESSION: Status post interval percutaneous transhepatic cholecystostomy. Cholecystostomy catheter in good position within the gallbladder. There is improvement in the gallbladder distention, pericholecystic fluid and inflammation compatible with resolving cholecystitis. No biliary dilatation or obstruction Mild hepatic steatosis No fluid collection or abscess Other chronic findings as above Electronically Signed   By: Jerilynn Mages.  Shick M.D.   On: 10/13/2017 22:23   Dg Cholangiogram  Existing Tube  Result Date: 10/08/2017 INDICATION: 57 year old female with a history of acute cholecystitis without definite cholelithiasis. She was a poor operative candidate at the time of cholecystitis and was therefore treated by percutaneous cholecystostomy tube placement on 09/13/2017. She presents today for her initial follow-up cholangiogram. EXAM: Cholangiogram through existing catheter MEDICATIONS: None ANESTHESIA/SEDATION: None FLUOROSCOPY TIME:  Fluoroscopy Time: 1 minutes 0 seconds (197 mGy). COMPLICATIONS: None immediate. PROCEDURE: Informed written consent was  obtained from the patient after a thorough discussion of the procedural risks, benefits and alternatives. All questions were addressed. A timeout was performed prior to the initiation of the procedure. Contrast was injected through the existing catheter under fluoroscopy. Contrast fills the gallbladder neck, cystic duct and common bile duct before emptying into the duodenum through the patent ampulla. No evidence of choledocholithiasis. IMPRESSION: 1. Well-positioned percutaneous cholecystostomy tube. 2. Restored patency of the cystic and common bile ducts. Electronically Signed   By: Jacqulynn Cadet M.D.   On: 10/08/2017 13:43   Ir Radiologist Eval & Mgmt  Result Date: 10/08/2017 Please refer to notes tab for details about interventional procedure. (Op Note)  US Abdomen Limited Ruq  Result Date: 10/14/2017 CLINICAL DATA:  Right upper quadrant pain, status post cholecystostomy EXAM: ULTRASOUND ABDOMEN LIMITED RIGHT UPPER QUADRANT COMPARISON:  CT abdomen/pelvis dated 10/13/2017 FINDINGS: Gallbladder: Mild wall thickening with layering sludge and indwelling cholecystostomy tube. No definite pericholecystic fluid. Positive sonographic Murphy's sign. Common bile duct: Diameter: 6 mm Liver: Hyperechoic hepatic parenchyma with coarse hepatic echotexture. No focal hepatic lesion is seen. Portal vein is patent on color Doppler imaging with normal direction of blood flow towards the liver. IMPRESSION: Cholecystostomy tube in satisfactory position. Associated gallbladder wall thickening with layering sludge. Hyperechoic hepatic parenchyma with coarse hepatic echotexture, suggesting hepatic steatosis. Electronically Signed  By: Julian Hy M.D.   On: 10/14/2017 01:18      Subjective: No abdominal pain. No issues overnight.  Discharge Exam: Vitals:   10/17/17 0911 10/17/17 0913  BP:    Pulse:    Resp:    Temp:    SpO2: 97% 97%   Vitals:   10/16/17 2000 10/16/17 2102 10/17/17 0911 10/17/17 0913   BP: 135/61     Pulse: 67     Resp: 16     Temp: 98.3 F (36.8 C)     TempSrc: Oral     SpO2: 97% 96% 97% 97%  Weight:      Height:        General: Pt is alert, awake, not in acute distress Cardiovascular: RRR, S1/S2 +, no rubs, no gallops Respiratory: CTA bilaterally, no wheezing, no rhonchi Abdominal: Soft, NT, ND, bowel sounds + Extremities: no edema, no cyanosis    The results of significant diagnostics from this hospitalization (including imaging, microbiology, ancillary and laboratory) are listed below for reference.     Microbiology: Recent Results (from the past 240 hour(s))  Urine culture     Status: Abnormal   Collection Time: 10/13/17  9:01 PM  Result Value Ref Range Status   Specimen Description   Final    URINE, CLEAN CATCH Performed at [facility] [address], [place], [place] [address]    Special Requests   Final    NONE Performed at [facility], [place] [address] [place], [place] [address]    Culture >=100,000 COLONIES/mL PROTEUS MIRABILIS (A)  Final   Report Status 10/16/2017 FINAL  Final   Organism ID, Bacteria PROTEUS MIRABILIS (A)  Final      Susceptibility   Proteus mirabilis - MIC*    AMPICILLIN <=2 SENSITIVE Sensitive     CEFAZOLIN <=4 SENSITIVE Sensitive     CEFTRIAXONE <=1 SENSITIVE Sensitive     CIPROFLOXACIN <=0.25 SENSITIVE Sensitive     GENTAMICIN <=1 SENSITIVE Sensitive     IMIPENEM 2 SENSITIVE Sensitive     NITROFURANTOIN 128 RESISTANT Resistant     TRIMETH/SULFA <=20 SENSITIVE Sensitive     AMPICILLIN/SULBACTAM <=2 SENSITIVE Sensitive     PIP/TAZO <=4 SENSITIVE Sensitive     * >=100,000 COLONIES/mL PROTEUS MIRABILIS  Aerobic Culture (superficial specimen)     Status: None (Preliminary result)   Collection Time: 10/15/17 12:21 PM  Result Value Ref Range Status   Specimen Description   Final    BILE Performed at [facility] [address], [place], [place] [address]    Special  Requests   Final    NONE Performed at [facility], [place] [address], [place], [place] [address]    Gram Stain NO WBC SEEN RARE GRAM POSITIVE COCCI   Final   Culture   Final    FEW STAPHYLOCOCCUS AUREUS SUSCEPTIBILITIES TO FOLLOW Performed at [facility], [place] [address], [place], [place] [address]    Report Status PENDING  Incomplete     Labs: BNP (last 3 results) Recent Labs    09/12/17 1900  BNP 220.2*   Basic Metabolic Panel: Recent Labs  Lab 10/13/17 1509 10/14/17 0450 10/15/17 0331 10/16/17 0404 10/17/17 0436  NA 145 142 145 144 146*  K 4.1 3.5 3.4* 3.0* 3.0*  CL 111 111 113* 112* 111  CO2 23 23 23 23 24   GLUCOSE 96 91 139* 140* 118*  BUN 14 13 8  <5* <5*  CREATININE 0.96 0.92 0.85 0.77 0.90  CALCIUM 9.5 9.1 9.1 8.7* 9.0  MG  --   --  2.0  --   --   PHOS  --   --  3.0  --   --    Liver Function Tests: Recent Labs  Lab 10/13/17 1509 10/14/17 0450 10/15/17 0331 10/16/17 0404 10/17/17 0436  AST 190* 230* 327* 291* 203*  ALT 116* 126* 173* 191* 174*  ALKPHOS 305* 295* 317* 317* 297*  BILITOT 3.3* 3.1* 2.4* 2.2* 1.8*  PROT 7.3 6.6 6.2* 6.1* 6.1*  ALBUMIN 2.9* 2.7* 2.5* 2.4* 2.4*   Recent Labs  Lab 10/13/17 1509  LIPASE 33   No results for input(s): AMMONIA in the last 168 hours. CBC: Recent Labs  Lab 10/13/17 1509 10/15/17 0331  WBC 7.0 5.8  NEUTROABS 3.2  --   HGB 10.5* 9.9*  HCT 33.7* 32.1*  MCV 91.1 91.5  PLT 253 279   Cardiac Enzymes: No results for input(s): CKTOTAL, CKMB, CKMBINDEX, TROPONINI in the last 168 hours. BNP: Invalid input(s): POCBNP CBG: Recent Labs  Lab 10/16/17 1953 10/17/17 0005 10/17/17 0452 10/17/17 0814 10/17/17 1217  GLUCAP 101* 111* 115* 97 119*   D-Dimer No results for input(s): DDIMER in the last 72 hours. Hgb A1c No results for input(s): HGBA1C in the last 72 hours. Lipid Profile No results for input(s): CHOL, HDL, LDLCALC, TRIG, CHOLHDL, LDLDIRECT in the last 72  hours. Thyroid function studies No results for input(s): TSH, T4TOTAL, T3FREE, THYROIDAB in the last 72 hours.  Invalid input(s): FREET3 Anemia work up No results for input(s): VITAMINB12, FOLATE, FERRITIN, TIBC, IRON, RETICCTPCT in the last 72 hours. Urinalysis    Component Value Date/Time   COLORURINE AMBER (A) 10/13/2017 2101   APPEARANCEUR CLOUDY (A) 10/13/2017 2101   LABSPEC 1.018 10/13/2017 2101   PHURINE 7.0 10/13/2017 2101   GLUCOSEU NEGATIVE 10/13/2017 2101   GLUCOSEU NEGATIVE 04/27/2013 1218   HGBUR SMALL (A) 10/13/2017 2101   BILIRUBINUR SMALL (A) 10/13/2017 2101   KETONESUR 5 (A) 10/13/2017 2101   PROTEINUR 30 (A) 10/13/2017 2101   UROBILINOGEN 1.0 11/08/2014 1050   NITRITE NEGATIVE 10/13/2017 2101   LEUKOCYTESUR MODERATE (A) 10/13/2017 2101   Sepsis Labs Invalid input(s): PROCALCITONIN,  WBC,  LACTICIDVEN Microbiology Recent Results (from the past 240 hour(s))  Urine culture     Status: Abnormal   Collection Time: 10/13/17  9:01 PM  Result Value Ref Range Status   Specimen Description   Final    URINE, CLEAN CATCH Performed at [facility], [place] [address] [place], [place] [address]    Special Requests   Final    NONE Performed at [facility], [place] [address], [place], [place] [address]    Culture >=100,000 COLONIES/mL PROTEUS MIRABILIS (A)  Final   Report Status 10/16/2017 FINAL  Final   Organism ID, Bacteria PROTEUS MIRABILIS (A)  Final      Susceptibility   Proteus mirabilis - MIC*    AMPICILLIN <=2 SENSITIVE Sensitive     CEFAZOLIN <=4 SENSITIVE Sensitive     CEFTRIAXONE <=1 SENSITIVE Sensitive     CIPROFLOXACIN <=0.25 SENSITIVE Sensitive     GENTAMICIN <=1 SENSITIVE Sensitive     IMIPENEM 2 SENSITIVE Sensitive     NITROFURANTOIN 128 RESISTANT Resistant     TRIMETH/SULFA <=20 SENSITIVE Sensitive     AMPICILLIN/SULBACTAM <=2 SENSITIVE Sensitive     PIP/TAZO <=4 SENSITIVE Sensitive     * >=100,000 COLONIES/mL PROTEUS  MIRABILIS  Aerobic Culture (superficial specimen)     Status: None (Preliminary result)   Collection Time: 10/15/17 12:21 PM  Result Value Ref Range Status   Specimen Description   Final    BILE Performed at [facility] [address] [place], [place] [address]    Special Requests   Final    NONE Performed at [facility], [place] [address], [place], [place] [address]    Gram Stain NO WBC SEEN RARE GRAM POSITIVE COCCI   Final   Culture   Final    FEW STAPHYLOCOCCUS AUREUS SUSCEPTIBILITIES TO FOLLOW Performed at [facility], [place] [address] [place], [place] [address]    Report Status PENDING  Incomplete     SIGNED:   Cordelia Poche, MD [facility] 10/17/2017, 1:12 PM

## 2017-10-18 LAB — AEROBIC CULTURE  (SUPERFICIAL SPECIMEN)

## 2017-10-18 LAB — AEROBIC CULTURE W GRAM STAIN (SUPERFICIAL SPECIMEN): Gram Stain: NONE SEEN

## 2017-10-19 DIAGNOSIS — K819 Cholecystitis, unspecified: Secondary | ICD-10-CM | POA: Diagnosis not present

## 2017-10-19 DIAGNOSIS — J9611 Chronic respiratory failure with hypoxia: Secondary | ICD-10-CM | POA: Diagnosis not present

## 2017-10-19 DIAGNOSIS — Z434 Encounter for attention to other artificial openings of digestive tract: Secondary | ICD-10-CM | POA: Diagnosis not present

## 2017-10-19 DIAGNOSIS — J449 Chronic obstructive pulmonary disease, unspecified: Secondary | ICD-10-CM | POA: Diagnosis not present

## 2017-10-19 DIAGNOSIS — I130 Hypertensive heart and chronic kidney disease with heart failure and stage 1 through stage 4 chronic kidney disease, or unspecified chronic kidney disease: Secondary | ICD-10-CM | POA: Diagnosis not present

## 2017-10-19 DIAGNOSIS — I5030 Unspecified diastolic (congestive) heart failure: Secondary | ICD-10-CM | POA: Diagnosis not present

## 2017-10-20 ENCOUNTER — Other Ambulatory Visit: Payer: Self-pay | Admitting: *Deleted

## 2017-10-20 ENCOUNTER — Telehealth: Payer: Self-pay | Admitting: Internal Medicine

## 2017-10-20 DIAGNOSIS — K810 Acute cholecystitis: Secondary | ICD-10-CM

## 2017-10-20 NOTE — Telephone Encounter (Signed)
Please advise 

## 2017-10-20 NOTE — Telephone Encounter (Signed)
Copied from [place] [id number]. Topic: Quick Communication - Rx Refill/Question >> Oct 20, 2017 10:25 AM Reyne Dumas L wrote: Medication:  Pt states she was given an antibiotic in the hospital and it has caused her to have a yeast infection.  Pt wants to know if she can get something called in to clear the yeast infection.  Has the patient contacted their pharmacy? no (Agent: If no, request that the patient contact the pharmacy for the refill.) (Agent: If yes, when and what did the pharmacy advise?)  Preferred Pharmacy (with phone number or street name): [facility], [place], [place] - [place] [phone] (Phone) [phone] (Fax)  Agent: Please be advised that RX refills may take up to 3 business days. We ask that you follow-up with your pharmacy.

## 2017-10-20 NOTE — Patient Outreach (Signed)
[facility] ([facility]) Care Management  10/20/2017  [name] 12/21/1960 [id number]   Member was admitted to hospital 9/30-10/4 with complications of cholecystitis.  State her percutaneous drainage bag was removed too soon.  Her bag removed 9/25, feeling sick starting on 9/27, presented to hospital on 9/30.  Home health still involved, last visit Sunday, scheduled every 4 days.  Has appointment with [facility] on 10/9 for assessment.  Appointment with primary MD on 10/10.  Unsure if she will need to follow up with infectious disease doctor, advised to discuss with primary MD this week.  Denies any urgent concerns, feels comfortable caring for drainage bag/tube.  Will follow up next week, advised to contact this care manager with questions.  [facility] CM Care Plan Problem One     Most Recent Value  Care Plan Problem One  Risk for admision related to cholecystitic as evidenced by recent hospitalization  Role Documenting the Problem One  Care Management Coordinator  Care Plan for Problem One  Active  [facility] Long Term Goal   Member wil not have any readmissions within the next 31 days.  [facility] Long Term Goal Start Date  10/20/17 [Not met, date reset]  Interventions for Problem One Long Term Goal  Discharge instructions reviewed. confirmed member has home health restarted to manage percutaneous drain in effort to decrease risk of readmission  [facility] CM Short Term Goal #1   Member will report flushing drain 3 times a day as insructed over the next 4 weeks  [facility] CM Short Term Goal #1 Start Date  09/22/17  [facility] CM Short Term Goal #1 Met Date  10/20/17  [facility] CM Short Term Goal #2   Member will keep and attend follow up appointments (primary, ID, radiology and GI) within the next 3 weeks  [facility] CM Short Term Goal #2 Start Date  09/22/17  [facility] CM Short Term Goal #2 Met Date  10/20/17      Lane, RN, MSN [facility] Care Management  Community Care Manager [phone]  

## 2017-10-21 ENCOUNTER — Telehealth: Payer: Self-pay | Admitting: Internal Medicine

## 2017-10-21 MED ORDER — FLUCONAZOLE 150 MG PO TABS: 150.0000 mg | ORAL_TABLET | Freq: Once | ORAL | 1 refills | Status: AC

## 2017-10-21 NOTE — Telephone Encounter (Deleted)
Copied from [place] [id number]. Topic: General - Other >> Oct 21, 2017 12:02 PM Oneta Rack wrote: Caller name: Nicollette  Relation to pt: LPN from [facility]  Call back number: [phone]    Reason for call:  Verbal orders for resumption of care to continue seeing patient for 2x 4, 1x 1 and 3 PRN visit. Patient refusing PT, patient states she will continue to resume PT exercise by herself.

## 2017-10-21 NOTE — Telephone Encounter (Signed)
Copied from [place] [id number]. Topic: General - Other >> Oct 21, 2017 12:02 PM Oneta Rack wrote: Caller name: Nicollette  Relation to pt: LPN from [facility]  Call back number: [phone]    Reason for call:  Verbal orders for resumption of care to continue seeing patient for 2x 4, 1x 1 and 3 PRN visit. Patient refusing PT, patient states she will continue to resume PT exercise by herself.

## 2017-10-21 NOTE — Telephone Encounter (Signed)
Ok Diflucan Rx emailed Thx 

## 2017-10-22 ENCOUNTER — Other Ambulatory Visit: Payer: Medicare Other

## 2017-10-22 ENCOUNTER — Other Ambulatory Visit: Payer: Self-pay | Admitting: Surgery

## 2017-10-22 DIAGNOSIS — K819 Cholecystitis, unspecified: Secondary | ICD-10-CM

## 2017-10-22 NOTE — Telephone Encounter (Signed)
Notified Nicolette w/MD response../lmb 

## 2017-10-22 NOTE — Telephone Encounter (Signed)
Ok Thx 

## 2017-10-23 ENCOUNTER — Encounter: Payer: Self-pay | Admitting: Internal Medicine

## 2017-10-23 ENCOUNTER — Ambulatory Visit (INDEPENDENT_AMBULATORY_CARE_PROVIDER_SITE_OTHER): Payer: Medicare Other | Admitting: Internal Medicine

## 2017-10-23 VITALS — BP 124/76 | HR 67 | Temp 97.9°F | Ht 67.0 in | Wt 255.0 lb

## 2017-10-23 DIAGNOSIS — I2782 Chronic pulmonary embolism: Secondary | ICD-10-CM

## 2017-10-23 DIAGNOSIS — R21 Rash and other nonspecific skin eruption: Secondary | ICD-10-CM | POA: Diagnosis not present

## 2017-10-23 DIAGNOSIS — L299 Pruritus, unspecified: Secondary | ICD-10-CM | POA: Insufficient documentation

## 2017-10-23 DIAGNOSIS — E1122 Type 2 diabetes mellitus with diabetic chronic kidney disease: Secondary | ICD-10-CM

## 2017-10-23 DIAGNOSIS — E538 Deficiency of other specified B group vitamins: Secondary | ICD-10-CM | POA: Diagnosis not present

## 2017-10-23 DIAGNOSIS — N182 Chronic kidney disease, stage 2 (mild): Secondary | ICD-10-CM

## 2017-10-23 MED ORDER — TRIAMCINOLONE ACETONIDE 0.1 % EX CREA: 1.0000 "application " | TOPICAL_CREAM | Freq: Two times a day (BID) | CUTANEOUS | 2 refills | Status: DC

## 2017-10-23 MED ORDER — VENLAFAXINE HCL ER 75 MG PO CP24: 75.0000 mg | ORAL_CAPSULE | Freq: Every day | ORAL | 11 refills | Status: DC

## 2017-10-23 MED ORDER — HYDROXYZINE HCL 25 MG PO TABS: 25.0000 mg | ORAL_TABLET | Freq: Three times a day (TID) | ORAL | 1 refills | Status: DC | PRN

## 2017-10-23 NOTE — Assessment & Plan Note (Signed)
On B12 

## 2017-10-23 NOTE — Assessment & Plan Note (Signed)
Xarelto

## 2017-10-23 NOTE — Assessment & Plan Note (Signed)
Labs

## 2017-10-23 NOTE — Patient Instructions (Signed)
Sign up for [employer] library ( via Libby app on your phone or your ipad). If you don't have a library card  - go to any library branch. They will set you up in 15 minutes. It is free. You can check out books to read and to listen, check out magazines and newspapers, movies etc.   

## 2017-10-23 NOTE — Progress Notes (Signed)
Subjective:  Patient ID: [name], female    DOB: [birth year]  Age: 57 y.o. MRN: [id number]  CC: No chief complaint on file.   HPI [name] presents for another hosp stay for her GB problem (d/c'd on 10/4) Feeling ok. 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  Patient reports since drain was removed sheinitiallydid well until the past 3 days.  ED Course:Stable vitals.WBC 7.Elevated liver enzymes-AST 190 ALT 116 ALP 305 total bili 3.3 UA moderate leukocytes rare bacteria.CT abdomen and pelvis with contrast-status post interval percutaneous transhepatic cholecystostomy.Cholecystostomy catheter in good position within the gallbladder.Improvement in gallbladder distention pericholecystic fluid,resolving cholecystitis. 1L bolus given.GI was consulted,recommended right upper quadrant ultrasound they will see in a.m. will likely need IR involvement also,start IV antibiotics if fever.    Hospital course:  Abdominal pain Secondary to capped biliary drain. Associated elevated LFTs. Radiology on board and have attached drain to gravity bag.LFTs continued to trend upwards. HIDA obtained which showed clear ducts. Prior to discharge, AST, ALT, alkaline phosphatase trending down. Patient asymptomatic. Follow up with interventional radiology as an outpatient.  History of acute cholecystitis with sepsis S/p  biliary drain.Management above.  Depression/anxiety Continue Topamax, Klonopin and venlafaxine  PE/DVT Xarelto held on admission. Restarted. Continue on discharge.  Diabetes mellitus, type 2 Continue Januvia and resume metformin.  Sarcoidosis On Humira weekly as an outpatient  Essential hypertension Continue home Coreg  COPD Chronic respiratory failure with hypoxia On 2 liters of O2 via [place]  ProteusmirabilisUTI Urinary frequency as a symptom. No dysuria.Resistant only to nitrofurantoin. Empirically treated with ceftriaxone. Transitioned to Cefdinir on discharge.  Discharge Diagnoses:  Principal Problem:   Abdominal pain Active Problems:   Sarcoidosis   Major depressive disorder, recurrent episode ([place])   Essential hypertension   Obesity, Class III, BMI 40-49.9 (morbid obesity) ([place])   Controlled type 2 diabetes mellitus with chronic kidney disease, without long-term current use of insulin (HCC)   COPD (chronic obstructive pulmonary disease) (HCC)   Cholecystitis, acute   Abnormal liver function test"    Outpatient Medications Prior to Visit  Medication Sig Dispense Refill  . acetaminophen (TYLENOL) 325 MG tablet Take 2 tablets (650 mg total) by mouth every 6 (six) hours as needed for mild pain, fever or headache.    . Adalimumab (HUMIRA) 40 MG/0.8ML PSKT Inject 40 mg into the skin every 7 (seven) days.     [name] augmented betamethasone dipropionate (DIPROLENE-AF) 0.05 % ointment Apply topically 2 (two) times daily.  8  . B-D ULTRA-FINE 33 LANCETS MISC Use as instructed to test sugars 4 times daily DX: E11.8 400 each 1  . budesonide-formoterol (SYMBICORT) 160-4.5 MCG/ACT inhaler Inhale 2 puffs into the lungs TWICE DAILY 120/4=30 10.2 g 5  . carvedilol (COREG) 12.5 MG tablet Take 1 tablet (12.5 mg total) by mouth 2 (two) times daily with a meal. 180 tablet 3  . cefdinir (OMNICEF) 300 MG capsule Take 1 capsule (300 mg total) by mouth 2 (two) times daily for 8  days. 16 capsule  0  . clonazePAM (KLONOPIN) 0.5 MG tablet Take 1-2 tablets (0.5-1 mg total) by mouth at bedtime. (Patient taking differently: Take 0.5 mg by mouth at bedtime. ) 60 tablet 3  . esomeprazole (NEXIUM) 40 MG capsule Take 1 capsule (40 mg total) by mouth daily before breakfast. 90 capsule 1  . furosemide (LASIX) 40 MG tablet TAKE 1 TABLET BY MOUTH EVERY MORNING FOR EDEMA (Patient taking differently: Take 20 mg by mouth daily as needed for fluid or edema. ) 90 tablet 1  . gabapentin (NEURONTIN) 300 MG capsule Take 1 capsule (300 mg total) by mouth 2 (two) times daily as needed (for pain). 180 capsule 1  . glucose blood (EZ SMART PLUS GLUCOSE TEST) test strip Use as instructed to test sugars 4 times daily DX: E11.8 400 each 1  . JANUVIA 50 MG tablet Take 1 tablet (50 mg total) by mouth daily. (Patient taking differently: Take 50 mg by mouth daily. ) 90 tablet 0  . metFORMIN (GLUCOPHAGE-XR) 500 MG 24 hr tablet TAKE 2 TABLETS(1000 MG) BY MOUTH DAILY WITH BREAKFAST    . oxyCODONE (OXY IR/ROXICODONE) 5 MG immediate release tablet Take 1 tablet (5 mg total) by mouth every 4 (four) hours as needed for moderate pain. 20 tablet 0  . polyethylene glycol (MIRALAX / GLYCOLAX) packet Take 17 g by mouth daily. 14 each 0  . potassium chloride (K-DUR) 10 MEQ tablet Take 1 tablet (10 mEq total) by mouth daily. 30 tablet 0  . topiramate (TOPAMAX) 50 MG tablet Take 1 tablet (50 mg total) by mouth 2 (two) times daily. 180 tablet 1  . venlafaxine XR (EFFEXOR-XR) 75 MG 24 hr capsule Take 1 capsule (75 mg total) by mouth daily with breakfast. 30 capsule 11  . VENTOLIN HFA 108 (90 Base) MCG/ACT inhaler Inhale 2 puffs into the lungs every 6 (six) hours as needed for wheezing or shortness of breath. 200/8=25 (Patient taking differently: Inhale 2 puffs into the lungs every 6 (six) hours as needed for wheezing or shortness of breath. ) 18 g 11  . VOLTAREN 1 % GEL Apply 2 g topically 2 (two) times daily as needed (for  pain). 100/4=25 (Patient taking differently: Apply 4 g topically 4 (four) times daily as needed (pain). ) 100 g 2  . XARELTO 20 MG TABS tablet Take 1 tablet (20 mg total) by mouth at bedtime. 90 tablet 3   No facility-administered medications prior to visit.     ROS: Review of Systems  Constitutional: Negative for activity change, appetite change, chills, fatigue and unexpected weight change.  HENT: Negative for congestion, mouth sores and sinus pressure.   Eyes: Negative for visual disturbance.  Respiratory: Negative for cough and chest tightness.   Gastrointestinal: Negative for abdominal distention, abdominal pain, diarrhea and nausea.  Genitourinary: Negative for difficulty urinating, frequency and vaginal pain.  Musculoskeletal: Positive for arthralgias, back pain and gait problem.  Skin: Negative for pallor and rash.  Neurological: Negative for dizziness, tremors, weakness, numbness and headaches.  Psychiatric/Behavioral: Negative for confusion and sleep disturbance.    Objective:  BP 124/76 (BP Location: Right Arm, Patient Position: Sitting, Cuff Size: Large)   Pulse 67   Temp 97.9 F (36.6 C) (Oral)   Ht 5\' 7"  (1.702 m)   Wt 255 lb (115.7 kg)   SpO2 97%   BMI 39.94 kg/m   BP Readings from Last 3 Encounters:  10/23/17 124/76  10/16/17 135/61  10/08/17 135/73    Wt Readings from Last 3  Encounters:  10/23/17 255 lb (115.7 kg)  10/13/17 257 lb (116.6 kg)  10/06/17 257 lb 8 oz (116.8 kg)    Physical Exam  Constitutional: She appears well-developed. No distress.  HENT:  Head: Normocephalic.  Right Ear: External ear normal.  Left Ear: External ear normal.  Nose: Nose normal.  Mouth/Throat: Oropharynx is clear and moist.  Eyes: Pupils are equal, round, and reactive to light. Conjunctivae are normal. Right eye exhibits no discharge. Left eye exhibits no discharge.  Neck: Normal range of motion. Neck supple. No JVD present. No tracheal deviation present. No  thyromegaly present.  Cardiovascular: Normal rate, regular rhythm and normal heart sounds.  Pulmonary/Chest: No stridor. No respiratory distress. She has no wheezes.  Abdominal: Soft. Bowel sounds are normal. She exhibits no distension and no mass. There is no tenderness. There is no rebound and no guarding.  Musculoskeletal: She exhibits tenderness. She exhibits no edema.  Lymphadenopathy:    She has no cervical adenopathy.  Neurological: She displays normal reflexes. No cranial nerve deficit. She exhibits normal muscle tone. Coordination abnormal.  Skin: No rash noted. No erythema.  Psychiatric: She has a normal mood and affect. Her behavior is normal. Judgment and thought content normal.  shins w/papular rash RUQ drain/bag  Walker O2    Lab Results  Component Value Date   WBC 5.8 10/15/2017   HGB 9.9 (L) 10/15/2017   HCT 32.1 (L) 10/15/2017   PLT 279 10/15/2017   GLUCOSE 118 (H) 10/17/2017   CHOL 237 (H) 06/30/2017   TRIG 271.0 (H) 06/30/2017   HDL 70.00 06/30/2017   LDLDIRECT 107.0 06/30/2017   LDLCALC 97 06/29/2008   ALT 174 (H) 10/17/2017   AST 203 (H) 10/17/2017   NA 146 (H) 10/17/2017   K 3.0 (L) 10/17/2017   CL 111 10/17/2017   CREATININE 0.90 10/17/2017   BUN <5 (L) 10/17/2017   CO2 24 10/17/2017   TSH 2.31 08/23/2016   INR 1.34 10/13/2017   HGBA1C 5.8 (H) 06/26/2017   MICROALBUR <0.7 02/01/2015    Ct Abdomen Pelvis W Contrast  Result Date: 10/13/2017 CLINICAL DATA:  Abdominal pain, recent cholecystitis, status post percutaneous cholecystostomy 09/12/2017 EXAM: CT ABDOMEN AND PELVIS WITH CONTRAST TECHNIQUE: Multidetector CT imaging of the abdomen and pelvis was performed using the standard protocol following bolus administration of intravenous contrast. CONTRAST:  141mL ISOVUE-300 IOPAMIDOL (ISOVUE-300) INJECTION 61% COMPARISON:  09/12/2017 FINDINGS: Lower chest: Similar bibasilar nodular scarring/atelectasis. No pleural or pericardial effusion. Normal heart  size for age. Degenerative changes of the thoracic spine on the right. Hepatobiliary: Mild hypoattenuation of the liver suggesting hepatic steatosis. No focal hepatic abnormality or biliary obstruction. No surrounding perihepatic free fluid, hemorrhage, or subcapsular hematoma. Interval right upper quadrant transhepatic cholecystostomy within the gallbladder. There is interval improvement in the gallbladder distension and surrounding pericholecystic fluid inflammation. Gallbladder is decompressed. Common bile duct nondilated. Pancreas: Unremarkable. No pancreatic ductal dilatation or surrounding inflammatory changes. Spleen: Normal in size without focal abnormality. Adrenals/Urinary Tract: Normal adrenal glands. Normal symmetric renal enhancement and excretion. 12 mm right renal cortical exophytic cyst as before. Ureters are symmetric and decompressed. No obstructing ureteral calculus. Bladder is underdistended. Stomach/Bowel: Negative for bowel obstruction, significant dilatation, ileus, or free air. Scattered colonic diverticulosis. Remote appendectomy noted. No fluid collection or abscess. No ascites. Vascular/Lymphatic: Aortic atherosclerosis. Negative for aneurysm or occlusive process. Mesenteric and renal vasculature appear patent. No adenopathy. Reproductive: Uterus and adnexa normal in size. Uterus is retroverted. No pelvic fluid collection, hemorrhage, abscess or  hematoma. Other: No abdominal wall hernia or abnormality. No abdominopelvic ascites. Musculoskeletal: Degenerative changes of the spine. No acute osseous finding. Lower lumbar facet arthropathy noted IMPRESSION: Status post interval percutaneous transhepatic cholecystostomy. Cholecystostomy catheter in good position within the gallbladder. There is improvement in the gallbladder distention, pericholecystic fluid and inflammation compatible with resolving cholecystitis. No biliary dilatation or obstruction Mild hepatic steatosis No fluid collection  or abscess Other chronic findings as above Electronically Signed   By: Jerilynn Mages.  Shick M.D.   On: 10/13/2017 22:23   US Abdomen Limited Ruq  Result Date: 10/14/2017 CLINICAL DATA:  Right upper quadrant pain, status post cholecystostomy EXAM: ULTRASOUND ABDOMEN LIMITED RIGHT UPPER QUADRANT COMPARISON:  CT abdomen/pelvis dated 10/13/2017 FINDINGS: Gallbladder: Mild wall thickening with layering sludge and indwelling cholecystostomy tube. No definite pericholecystic fluid. Positive sonographic Murphy's sign. Common bile duct: Diameter: 6 mm Liver: Hyperechoic hepatic parenchyma with coarse hepatic echotexture. No focal hepatic lesion is seen. Portal vein is patent on color Doppler imaging with normal direction of blood flow towards the liver. IMPRESSION: Cholecystostomy tube in satisfactory position. Associated gallbladder wall thickening with layering sludge. Hyperechoic hepatic parenchyma with coarse hepatic echotexture, suggesting hepatic steatosis. Electronically Signed   By: Julian Hy M.D.   On: 10/14/2017 01:18    Assessment & Plan:   There are no diagnoses linked to this encounter.   No orders of the defined types were placed in this encounter.    Follow-up: No follow-ups on file.  Walker Kehr, MD

## 2017-10-23 NOTE — Assessment & Plan Note (Signed)
?  related to humira vs cholestasis vs other 10/19 Hydroxyzine prn

## 2017-10-23 NOTE — Assessment & Plan Note (Signed)
?  related to humira vs other Hydroxyzine prn

## 2017-10-24 ENCOUNTER — Other Ambulatory Visit: Payer: Self-pay | Admitting: Pulmonary Disease

## 2017-10-24 DIAGNOSIS — I5030 Unspecified diastolic (congestive) heart failure: Secondary | ICD-10-CM | POA: Diagnosis not present

## 2017-10-24 DIAGNOSIS — J9611 Chronic respiratory failure with hypoxia: Secondary | ICD-10-CM | POA: Diagnosis not present

## 2017-10-24 DIAGNOSIS — I130 Hypertensive heart and chronic kidney disease with heart failure and stage 1 through stage 4 chronic kidney disease, or unspecified chronic kidney disease: Secondary | ICD-10-CM | POA: Diagnosis not present

## 2017-10-24 DIAGNOSIS — Z434 Encounter for attention to other artificial openings of digestive tract: Secondary | ICD-10-CM | POA: Diagnosis not present

## 2017-10-24 DIAGNOSIS — J449 Chronic obstructive pulmonary disease, unspecified: Secondary | ICD-10-CM | POA: Diagnosis not present

## 2017-10-24 DIAGNOSIS — K819 Cholecystitis, unspecified: Secondary | ICD-10-CM | POA: Diagnosis not present

## 2017-10-27 DIAGNOSIS — J9611 Chronic respiratory failure with hypoxia: Secondary | ICD-10-CM | POA: Diagnosis not present

## 2017-10-27 DIAGNOSIS — I130 Hypertensive heart and chronic kidney disease with heart failure and stage 1 through stage 4 chronic kidney disease, or unspecified chronic kidney disease: Secondary | ICD-10-CM | POA: Diagnosis not present

## 2017-10-27 DIAGNOSIS — J449 Chronic obstructive pulmonary disease, unspecified: Secondary | ICD-10-CM | POA: Diagnosis not present

## 2017-10-27 DIAGNOSIS — I5030 Unspecified diastolic (congestive) heart failure: Secondary | ICD-10-CM | POA: Diagnosis not present

## 2017-10-27 DIAGNOSIS — K819 Cholecystitis, unspecified: Secondary | ICD-10-CM | POA: Diagnosis not present

## 2017-10-27 DIAGNOSIS — Z434 Encounter for attention to other artificial openings of digestive tract: Secondary | ICD-10-CM | POA: Diagnosis not present

## 2017-10-28 ENCOUNTER — Encounter: Payer: Self-pay | Admitting: Internal Medicine

## 2017-10-28 ENCOUNTER — Other Ambulatory Visit: Payer: Self-pay | Admitting: Family

## 2017-10-28 ENCOUNTER — Ambulatory Visit (INDEPENDENT_AMBULATORY_CARE_PROVIDER_SITE_OTHER): Payer: Medicare Other | Admitting: Internal Medicine

## 2017-10-28 VITALS — BP 130/71 | HR 88 | Ht 67.0 in | Wt 252.0 lb

## 2017-10-28 DIAGNOSIS — E1122 Type 2 diabetes mellitus with diabetic chronic kidney disease: Secondary | ICD-10-CM

## 2017-10-28 DIAGNOSIS — E785 Hyperlipidemia, unspecified: Secondary | ICD-10-CM | POA: Diagnosis not present

## 2017-10-28 DIAGNOSIS — N182 Chronic kidney disease, stage 2 (mild): Secondary | ICD-10-CM

## 2017-10-28 LAB — POCT GLYCOSYLATED HEMOGLOBIN (HGB A1C): Hemoglobin A1C: 5.2 % (ref 4.0–5.6)

## 2017-10-28 MED ORDER — GLUCOSE BLOOD VI STRP: ORAL_STRIP | 12 refills | Status: DC

## 2017-10-28 NOTE — Addendum Note (Signed)
Addended by: Cardell Peach I on: 10/28/2017 01:55 PM   Modules accepted: Orders

## 2017-10-28 NOTE — Patient Instructions (Addendum)
Please continue: - Metformin ER 1000 mg in am  Please stop Januvia.  Please come back for a follow-up appointment in 4 months.

## 2017-10-28 NOTE — Progress Notes (Addendum)
Patient ID: [name], female   DOB: [birth year], 57 y.o.   MRN: [id number]   HPI: [name] is a 57 y.o.-year-old female, self-referred by her, for management of Type 2 (vs. steroid-induced) diabetes, dx in 2012, non-insulin-dependent, fairly well controlled, with complications (CKD,  CAD with diastolic dysfunction, h/o TIA). She previously saw Dr. Loanne Drilling.  Last visit with me 06/2017.  She was previously on insulin between 2013 and 2015.  I suspect that her diabetes is related to her intermittent prednisone doses for sarcoidosis.  She is currently on Humira.  At last visit she had tear duct surgery in 06/2017 and she was on a prednisone taper, but none since then.  She was admitted with transaminitis 10/13/2017 along with RUQ pain and pruritus. She was also admitted with cholecystitis before. Now has a drain. She will have cholecystectomy.  Latest HbA1c level was: Lab Results  Component Value Date   HGBA1C 5.8 (H) 06/26/2017   HGBA1C 6.7 (H) 04/02/2017   HGBA1C 7.1 (H) 07/22/2016   HGBA1C 5.9 01/30/2016   HGBA1C 5.8 (H) 07/04/2015   HGBA1C 6.0 04/03/2015   HGBA1C 5.8 02/01/2015   HGBA1C 6.2 06/28/2014   HGBA1C 6.8 (H) 03/24/2014   HGBA1C 6.8 (H) 08/02/2013   HGBA1C 6.4 04/27/2013   HGBA1C 7.3 (H) 01/27/2013   HGBA1C 7.1 (H) 09/17/2012   HGBA1C 6.7 (H) 06/02/2012   HGBA1C 6.1 03/03/2012   HGBA1C 6.1 11/26/2011   HGBA1C 16.4 (H) 05/09/2011   HGBA1C 7.3 (H) 10/10/2010   HGBA1C 7.0 (H) 07/09/2010   HGBA1C 6.2 03/07/2010   Pt is on a regimen of: - Metformin ER 1000 mg in a.m. - Januvia 50 mg in am  Pt checks her sugars once a day: - am: 120-125 >> 115-124 - 2h after b'fast: n/c >> 154 - before lunch: n/c - 2h after lunch: n/c - before dinner: n/c - 2h after dinner: n/c - bedtime: 135-140 >> 135-150 - nighttime: n/c Lowest sugar was 105 >> 115; it is unclear at which level she has hypoglycemia awareness. Highest sugar was 150 >> 154  Glucometer:  none now >> given meter at last visit  Pt's meals are: - Breakfast: watermelon; cereal + 2% milk; coffee; apple sauce or peach cup; or grits - Lunch: may skip; sandwich or soup - Dinner: cereal or sandwich or baked chicken + veggies (greens, corn) + rice or spaghetti >> salmon/organic chicken breast + broccoli/green beans + starch - Snacks: popcorn  -+ History of CKD, last BUN/creatinine:  Lab Results  Component Value Date   BUN <5 (L) 10/17/2017   BUN <5 (L) 10/16/2017   CREATININE 0.90 10/17/2017   CREATININE 0.77 10/16/2017   -+ HL; last set of lipids: Lab Results  Component Value Date   CHOL 237 (H) 06/30/2017   HDL 70.00 06/30/2017   LDLCALC 97 06/29/2008   LDLDIRECT 107.0 06/30/2017   TRIG 271.0 (H) 06/30/2017   CHOLHDL 3 06/30/2017  I rec'd pravastatin >> did not start. - last eye exam in 2016: No DR. She had an eye infection in 06/2017 >> had sx. Has a stent placed >> will come out in few days. She will then have eye exam.  - no numbness and tingling in her feet.   Pt has FH of DM in mother, sister.  She has lichen planus on legs.  ROS: Constitutional: no weight gain/+ weight loss, no fatigue, no subjective hyperthermia, no subjective hypothermia, + nocturia Eyes: no blurry vision, no xerophthalmia ENT:  no sore throat, no nodules palpated in throat, no dysphagia, no odynophagia, no hoarseness Cardiovascular: no CP/no SOB/no palpitations/no leg swelling Respiratory: no cough/no SOB/no wheezing Gastrointestinal: + N/no V/no D/no C/no acid reflux Musculoskeletal: no muscle aches/no joint aches Skin: + Rash on legs for which she just started triamcinolone cream, no hair loss Neurological: no tremors/no numbness/no tingling/no dizziness  I reviewed pt's medications, allergies, PMH, social hx, family hx, and changes were documented in the history of present illness. Otherwise, unchanged from my initial visit note.  Past Medical History:  Diagnosis Date  . Allergic  rhinitis   . ALLERGIC RHINITIS 10/26/2009  . Anxiety   . ANXIETY 08/14/2006  . B12 DEFICIENCY 08/25/2007  . Complication of anesthesia    pt has had difficulty following anesthesia with her knee in 2016-unable to care for herself afterward  . Confusion   . Depression    takes Lexapro daily  . Diabetes mellitus without complication ([place])    was on insulin but has been off since Nov 2015 and now only takes Metformin daily  . DYSPNEA 04/28/2009   with exertion  . Esophageal reflux    takes Nexium daily  . Fibromyalgia   . Headache    last migraine 2-30yrs ago;takes Topamax daily  . History of shingles   . Hypertension    takes Coreg daily  . Insomnia    takes Nortriptyline nightly   . Joint pain   . Joint swelling   . Left knee pain   . Lichen planus   . Long-term memory loss   . Nocturia   . OSA (obstructive sleep apnea)    doesn't use CPAP;sleep study in epic from 2006  . Osteoarthritis   . Osteoarthritis   . Pneumonia    over 30 yrs ago  . Protein calorie malnutrition ([place])   . Rheumatoid arthritis ([place])   . Sarcoidosis    Dr. Lolita Patella  . Short-term memory loss   . Shortness of breath   . TIA (transient ischemic attack)   . Unsteady gait   . Urinary urgency   . Vitamin D deficiency    is supposed to take Vit D but can't afford it  . VITAMIN D DEFICIENCY 08/25/2007   Past Surgical History:  Procedure Laterality Date  . APPENDECTOMY    . arthroscopic knee surgery Right 11-12-04  . AXILLARY ABCESS IRRIGATION AND DEBRIDEMENT  Jul & Aug2012  . CARPAL TUNNEL RELEASE Left 05/23/2014   Procedure: CARPAL TUNNEL RELEASE;  Surgeon: Meredith Pel, MD;  Location: [place];  Service: Orthopedics;  Laterality: Left;  . cyst removed from top of buttocks  at age 5  . ENDOMETRIAL ABLATION    . IR PERC CHOLECYSTOSTOMY  09/13/2017  . IR RADIOLOGIST EVAL & MGMT  10/08/2017  . LACRIMAL DUCT EXPLORATION Right 06/26/2017   Procedure: LACRIMAL DUCT EXPLORATION AND ETHMOIDECTOMY;   Surgeon: Clista Bernhardt, MD;  Location: [place];  Service: Ophthalmology;  Laterality: Right;  . TEAR DUCT PROBING Right 06/26/2017   Procedure: TEAR DUCT PROBING WITH STENT;  Surgeon: Clista Bernhardt, MD;  Location: [place];  Service: Ophthalmology;  Laterality: Right;  . TOTAL KNEE ARTHROPLASTY Right 11/15/2014   Procedure: TOTAL RIGHT KNEE ARTHROPLASTY;  Surgeon: Meredith Pel, MD;  Location: [place];  Service: Orthopedics;  Laterality: Right;  . TOTAL KNEE ARTHROPLASTY Left 07/13/2015   Procedure: LEFT TOTAL KNEE ARTHROPLASTY;  Surgeon: Meredith Pel, MD;  Location: [place];  Service: Orthopedics;  Laterality: Left;  Social History   Socioeconomic History  . Marital status: Single    Spouse name: Not on file  . Number of children: Not on file  . Years of education: Not on file  . Highest education level: Not on file  Occupational History  . Occupation: disabled  Social Needs  . Financial resource strain: Not on file  . Food insecurity:    Worry: Not on file    Inability: Not on file  . Transportation needs:    Medical: Not on file    Non-medical: Not on file  Tobacco Use  . Smoking status: Former Smoker    Packs/day: 0.50    Years: 10.00    Pack years: 5.00    Types: Cigarettes  . Smokeless tobacco: Never Used  . Tobacco comment: quit smoking in 2004  Substance and Sexual Activity  . Alcohol use: No  . Drug use: No  . Sexual activity: Not Currently  Lifestyle  . Physical activity:    Days per week: Not on file    Minutes per session: Not on file  . Stress: Not on file  Relationships  . Social connections:    Talks on phone: Not on file    Gets together: Not on file    Attends religious service: Not on file    Active member of club or organization: Not on file    Attends meetings of clubs or organizations: Not on file    Relationship status: Not on file  . Intimate partner violence:    Fear of current or ex partner: Not on file    Emotionally abused: Not  on file    Physically abused: Not on file    Forced sexual activity: Not on file  Other Topics Concern  . Not on file  Social History Narrative   Single, broke up with partner in 2008.   Current Outpatient Medications on File Prior to Visit  Medication Sig Dispense Refill  . acetaminophen (TYLENOL) 325 MG tablet Take 2 tablets (650 mg total) by mouth every 6 (six) hours as needed for mild pain, fever or headache.    . Adalimumab (HUMIRA) 40 MG/0.8ML PSKT Inject 40 mg into the skin every 7 (seven) days.     [name] augmented betamethasone dipropionate (DIPROLENE-AF) 0.05 % ointment Apply topically 2 (two) times daily.  8  . B-D ULTRA-FINE 33 LANCETS MISC Use as instructed to test sugars 4 times daily DX: E11.8 400 each 1  . budesonide-formoterol (SYMBICORT) 160-4.5 MCG/ACT inhaler Inhale 2 puffs into the lungs TWICE DAILY 120/4=30 10.2 g 0  . carvedilol (COREG) 12.5 MG tablet Take 1 tablet (12.5 mg total) by mouth 2 (two) times daily with a meal. 180 tablet 3  . clonazePAM (KLONOPIN) 0.5 MG tablet Take 1-2 tablets (0.5-1 mg total) by mouth at bedtime. (Patient taking differently: Take 0.5 mg by mouth at bedtime. ) 60 tablet 3  . esomeprazole (NEXIUM) 40 MG capsule Take 1 capsule (40 mg total) by mouth daily before breakfast. 90 capsule 1  . furosemide (LASIX) 40 MG tablet TAKE 1 TABLET BY MOUTH EVERY MORNING FOR EDEMA (Patient taking differently: Take 20 mg by mouth daily as needed for fluid or edema. ) 90 tablet 1  . gabapentin (NEURONTIN) 300 MG capsule Take 1 capsule (300 mg total) by mouth 2 (two) times daily as needed (for pain). 180 capsule 1  . glucose blood (EZ SMART PLUS GLUCOSE TEST) test strip Use as instructed to test sugars 4 times daily  DX: E11.8 400 each 1  . hydrOXYzine (ATARAX/VISTARIL) 25 MG tablet Take 1 tablet (25 mg total) by mouth every 8 (eight) hours as needed for itching. 60 tablet 1  . JANUVIA 50 MG tablet Take 1 tablet (50 mg total) by mouth daily. (Patient taking  differently: Take 50 mg by mouth daily. ) 90 tablet 0  . metFORMIN (GLUCOPHAGE-XR) 500 MG 24 hr tablet TAKE 2 TABLETS(1000 MG) BY MOUTH DAILY WITH BREAKFAST    . oxyCODONE (OXY IR/ROXICODONE) 5 MG immediate release tablet Take 1 tablet (5 mg total) by mouth every 4 (four) hours as needed for moderate pain. 20 tablet 0  . polyethylene glycol (MIRALAX / GLYCOLAX) packet Take 17 g by mouth daily. 14 each 0  . potassium chloride (K-DUR) 10 MEQ tablet Take 1 tablet (10 mEq total) by mouth daily. 30 tablet 0  . topiramate (TOPAMAX) 50 MG tablet Take 1 tablet (50 mg total) by mouth 2 (two) times daily. 180 tablet 1  . triamcinolone cream (KENALOG) 0.1 % Apply 1 application topically 2 (two) times daily. 80 g 2  . venlafaxine XR (EFFEXOR-XR) 75 MG 24 hr capsule Take 1 capsule (75 mg total) by mouth daily with breakfast. 30 capsule 11  . VENTOLIN HFA 108 (90 Base) MCG/ACT inhaler Inhale 2 puffs into the lungs every 6 (six) hours as needed for wheezing or shortness of breath. 200/8=25 (Patient taking differently: Inhale 2 puffs into the lungs every 6 (six) hours as needed for wheezing or shortness of breath. ) 18 g 11  . VOLTAREN 1 % GEL Apply 2 g topically 2 (two) times daily as needed (for pain). 100/4=25 (Patient taking differently: Apply 4 g topically 4 (four) times daily as needed (pain). ) 100 g 2  . XARELTO 20 MG TABS tablet Take 1 tablet (20 mg total) by mouth at bedtime. 90 tablet 3   No current facility-administered medications on file prior to visit.    Allergies  Allergen Reactions  . Belsomra [Suvorexant] Other (See Comments)    [place] out of bed while asleep: "I'm waking up as I'm falling on the floor;" "Night terrors"  . Enalapril Maleate Cough  . Latex Hives  . Nickel Other (See Comments)    Blisters  Pt has a titanium right and left knee - nickel causes skin irritations that form into blisters and sores  . Other Other (See Comments)    Patient has Sarcoidosis and can't tolerate any  metals  . Iodine Other (See Comments)    Patient received IV contrast without pre-meds without any problems 09/12/17 kwl Mother was intolerant--CODED on CT table---pt never tried  . Doxycycline Diarrhea and Nausea And Vomiting  . Hydrochlorothiazide Other (See Comments)    Low potassium levels   . Hydroxychloroquine Sulfate Other (See Comments)    Vision changes   . Lyrica [Pregabalin] Other (See Comments)    "Made me feel high"  . Tape Other (See Comments)    Medical tape causes bruising   Family History  Problem Relation Age of Onset  . Heart disease Mother   . Kidney disease Mother   . Cancer Father        leukemia  . Hypertension Unknown   . Coronary artery disease Unknown        female 1st degree relative <60  . Heart failure Unknown        congestive  . Coronary artery disease Unknown        Female 1st degree relative <50  .  Breast cancer Unknown        1st degree relative <50 S  . Breast cancer Sister     PE: BP 130/71   Pulse 88   Ht 5\' 7"  (1.702 m)   Wt 252 lb (114.3 kg)   SpO2 94%   BMI 39.47 kg/m  Wt Readings from Last 3 Encounters:  10/28/17 252 lb (114.3 kg)  10/23/17 255 lb (115.7 kg)  10/13/17 257 lb (116.6 kg)   Constitutional: overweight, in NAD + on 2 LPM oxygen with walking Eyes: PERRLA, EOMI, no exophthalmos ENT: moist mucous membranes, no thyromegaly, no cervical lymphadenopathy Cardiovascular: RRR, No MRG Respiratory: CTA B Gastrointestinal: abdomen soft, NT, ND, BS+ Musculoskeletal: no deformities, strength intact in all 4 Skin: moist, warm, no rashes Neurological: no tremor with outstretched hands, DTR normal in all 4  ASSESSMENT: 1. DM2, non-insulin-dependent, controlled, with complications - h/o CKD - CAD - cerebrovascular ds - s/p TIA  2. HL  3.  Obesity  PLAN:  1. Patient with long-standing, now better controlled type 2 diabetes, controlled only on oral medicines.  At last visit, she was not checking sugars as she lost her  meter, however, before she did that, 3 weeks prior to the appointment sugars were mostly at goal.  When I saw her last, she was on prednisone so we did not stop Januvia, but we discussed that we may be able to stop it in the near future.  We also discussed at that time about improving her diet by including less fatty foods and more fresh fruit and vegetables, whole grains, legumes. I also recommended a new eye exam. -At this visit, she tells me that she is following the diet above but mostly for her gallbladder problems.  Regardless, sugars are at or close to goal.  Therefore, we can stop the Januvia now.  I advised her that if her sugars start to increase, she may need to add this back. - I suggested to:  Patient Instructions  Please continue: - Metformin ER 1000 mg in am  Please stop Januvia.  Please come back for a follow-up appointment in 4 months.  - today, HbA1c is 5.2% (lower) - continue checking sugars at different times of the day - check 1x a day, rotating checks - advised for yearly eye exams >> she is due but coming up soon - Return to clinic in 4 mo with sugar log   2. HL - Reviewed latest lipid panel from last visit: LDL above goal, as are the triglycerides and the total cholesterol Lab Results  Component Value Date   CHOL 237 (H) 06/30/2017   HDL 70.00 06/30/2017   LDLCALC 97 06/29/2008   LDLDIRECT 107.0 06/30/2017   TRIG 271.0 (H) 06/30/2017   CHOLHDL 3 06/30/2017  -She did not start pravastatin as advised at last visit.  Due to recent history of transaminitis and pruritus, will hold off adding a statin for now.  3.  Obesity -She lost 20 pounds since last visit! -Unfortunately, this was in the context of not being able to eat well due to her gallbladder problems.  However, sugars are improved and I am hoping that she does not gain this back when she starts eating normally.  Philemon Kingdom, MD PhD [facility] Endocrinology

## 2017-10-31 DIAGNOSIS — K819 Cholecystitis, unspecified: Secondary | ICD-10-CM | POA: Diagnosis not present

## 2017-10-31 DIAGNOSIS — J9611 Chronic respiratory failure with hypoxia: Secondary | ICD-10-CM | POA: Diagnosis not present

## 2017-10-31 DIAGNOSIS — I130 Hypertensive heart and chronic kidney disease with heart failure and stage 1 through stage 4 chronic kidney disease, or unspecified chronic kidney disease: Secondary | ICD-10-CM | POA: Diagnosis not present

## 2017-10-31 DIAGNOSIS — I5030 Unspecified diastolic (congestive) heart failure: Secondary | ICD-10-CM | POA: Diagnosis not present

## 2017-10-31 DIAGNOSIS — Z434 Encounter for attention to other artificial openings of digestive tract: Secondary | ICD-10-CM | POA: Diagnosis not present

## 2017-10-31 DIAGNOSIS — J449 Chronic obstructive pulmonary disease, unspecified: Secondary | ICD-10-CM | POA: Diagnosis not present

## 2017-11-03 ENCOUNTER — Other Ambulatory Visit: Payer: Self-pay

## 2017-11-03 DIAGNOSIS — I2510 Atherosclerotic heart disease of native coronary artery without angina pectoris: Secondary | ICD-10-CM | POA: Diagnosis not present

## 2017-11-03 DIAGNOSIS — M069 Rheumatoid arthritis, unspecified: Secondary | ICD-10-CM

## 2017-11-03 DIAGNOSIS — Z86718 Personal history of other venous thrombosis and embolism: Secondary | ICD-10-CM

## 2017-11-03 DIAGNOSIS — Z434 Encounter for attention to other artificial openings of digestive tract: Secondary | ICD-10-CM | POA: Diagnosis not present

## 2017-11-03 DIAGNOSIS — K819 Cholecystitis, unspecified: Secondary | ICD-10-CM | POA: Diagnosis not present

## 2017-11-03 DIAGNOSIS — Z7901 Long term (current) use of anticoagulants: Secondary | ICD-10-CM

## 2017-11-03 DIAGNOSIS — N182 Chronic kidney disease, stage 2 (mild): Secondary | ICD-10-CM

## 2017-11-03 DIAGNOSIS — F329 Major depressive disorder, single episode, unspecified: Secondary | ICD-10-CM

## 2017-11-03 DIAGNOSIS — Z86711 Personal history of pulmonary embolism: Secondary | ICD-10-CM

## 2017-11-03 DIAGNOSIS — Z9981 Dependence on supplemental oxygen: Secondary | ICD-10-CM

## 2017-11-03 DIAGNOSIS — Z8673 Personal history of transient ischemic attack (TIA), and cerebral infarction without residual deficits: Secondary | ICD-10-CM

## 2017-11-03 DIAGNOSIS — D519 Vitamin B12 deficiency anemia, unspecified: Secondary | ICD-10-CM

## 2017-11-03 DIAGNOSIS — K219 Gastro-esophageal reflux disease without esophagitis: Secondary | ICD-10-CM

## 2017-11-03 DIAGNOSIS — Z7984 Long term (current) use of oral hypoglycemic drugs: Secondary | ICD-10-CM

## 2017-11-03 DIAGNOSIS — Z96653 Presence of artificial knee joint, bilateral: Secondary | ICD-10-CM

## 2017-11-03 DIAGNOSIS — G4733 Obstructive sleep apnea (adult) (pediatric): Secondary | ICD-10-CM

## 2017-11-03 DIAGNOSIS — M797 Fibromyalgia: Secondary | ICD-10-CM

## 2017-11-03 DIAGNOSIS — Z9181 History of falling: Secondary | ICD-10-CM

## 2017-11-03 DIAGNOSIS — I5030 Unspecified diastolic (congestive) heart failure: Secondary | ICD-10-CM | POA: Diagnosis not present

## 2017-11-03 DIAGNOSIS — Z6841 Body Mass Index (BMI) 40.0 and over, adult: Secondary | ICD-10-CM

## 2017-11-03 DIAGNOSIS — M1990 Unspecified osteoarthritis, unspecified site: Secondary | ICD-10-CM

## 2017-11-03 DIAGNOSIS — J449 Chronic obstructive pulmonary disease, unspecified: Secondary | ICD-10-CM | POA: Diagnosis not present

## 2017-11-03 DIAGNOSIS — E1122 Type 2 diabetes mellitus with diabetic chronic kidney disease: Secondary | ICD-10-CM | POA: Diagnosis not present

## 2017-11-03 DIAGNOSIS — E441 Mild protein-calorie malnutrition: Secondary | ICD-10-CM

## 2017-11-03 DIAGNOSIS — J9611 Chronic respiratory failure with hypoxia: Secondary | ICD-10-CM | POA: Diagnosis not present

## 2017-11-03 DIAGNOSIS — D869 Sarcoidosis, unspecified: Secondary | ICD-10-CM

## 2017-11-03 DIAGNOSIS — I130 Hypertensive heart and chronic kidney disease with heart failure and stage 1 through stage 4 chronic kidney disease, or unspecified chronic kidney disease: Secondary | ICD-10-CM | POA: Diagnosis not present

## 2017-11-03 DIAGNOSIS — F419 Anxiety disorder, unspecified: Secondary | ICD-10-CM

## 2017-11-03 NOTE — Patient Outreach (Signed)
[facility]) Care Management  11/03/2017  Tyree Vandruff [facility] April 25, 1960 [id number]  Incoming call from the patient, HIPAA identifiers confirmed. The patient states she has received several items in the mail from Medicare and does not understand the information. Patient requests home visit with BSW. Upon further discussion the patient states items are from Medicare plans such as [employer] and [facility]. BSW explained that Medicare open enrollment is active so most likely she is receiving advertisements in case the patient is interested in switching Medicare payor's.   BSW explained to the patient that this BSW was not qualified to assist in navigation of Medicare plans but offered to link the patient to a [facility] counselor. BSW explained the role of a [facility] counselor to the patient. The patient declined this offer stating she is not interested in switching anything. The patient is happy to have no co=paus for MD visits and free medications.   Prior to ending call, the patient requested her [place] contact her. BSW sent an in basket message to [facility] who is listed on the patients care team.  Daneen Schick, [place], [place] Management Social Worker [phone]

## 2017-11-04 ENCOUNTER — Other Ambulatory Visit: Payer: Self-pay | Admitting: *Deleted

## 2017-11-04 ENCOUNTER — Other Ambulatory Visit: Payer: Self-pay | Admitting: Internal Medicine

## 2017-11-04 ENCOUNTER — Telehealth: Payer: Self-pay | Admitting: Pulmonary Disease

## 2017-11-04 DIAGNOSIS — J449 Chronic obstructive pulmonary disease, unspecified: Secondary | ICD-10-CM | POA: Diagnosis not present

## 2017-11-04 DIAGNOSIS — I5030 Unspecified diastolic (congestive) heart failure: Secondary | ICD-10-CM | POA: Diagnosis not present

## 2017-11-04 DIAGNOSIS — I130 Hypertensive heart and chronic kidney disease with heart failure and stage 1 through stage 4 chronic kidney disease, or unspecified chronic kidney disease: Secondary | ICD-10-CM | POA: Diagnosis not present

## 2017-11-04 DIAGNOSIS — J9611 Chronic respiratory failure with hypoxia: Secondary | ICD-10-CM | POA: Diagnosis not present

## 2017-11-04 DIAGNOSIS — K819 Cholecystitis, unspecified: Secondary | ICD-10-CM | POA: Diagnosis not present

## 2017-11-04 DIAGNOSIS — Z434 Encounter for attention to other artificial openings of digestive tract: Secondary | ICD-10-CM | POA: Diagnosis not present

## 2017-11-04 NOTE — Patient Outreach (Addendum)
[facility]) Care Management  11/04/2017  Marda Breidenbach [facility] November 04, 1960 [id number]   Notified by BSW that member was requesting call from this care manager.  Call placed, she state she has not been able to get a refill for her glucose strips from the pharmacy. She has the Accu chek and Easy touch, insurance company does not cover those meters.  Advised to contact call insurance provide to inquire about what meter they cover, then contact MD office to request that particular one be ordered.  She verbalizes understanding.    Report she is feeling much better, denies fever or abdominal pain.  Has follow up appointments with primary MD and radiology within next week. Will follow up within the next month.  [facility] CM Care Plan Problem One     Most Recent Value  Care Plan Problem One  Risk for admision related to cholecystitic as evidenced by recent hospitalization  Role Documenting the Problem One  Care Management [place] for Problem One  Active  [facility] Long Term Goal   Member wil not have any readmissions within the next 31 days.  [facility] Long Term Goal Start Date  10/20/17 [Not met, date reset]  Interventions for Problem One Long Term Goal  Confirmed with member that she has specific care plan with home health nurse to include dressing changes multiple times a week and flushing daily  [facility] CM Short Term Goal #1   Member will report no complications from perc drain over the next 3 weeks  [facility] CM Short Term Goal #1 Start Date  11/04/17  Interventions for Short Term Goal #1  Reviewed upcoming appointments with member and advised of importance of attending.  Educated on complications of cholecyctitis and perc drain     Valente David, Therapist, sports, MSN [place] [phone]

## 2017-11-04 NOTE — Telephone Encounter (Signed)
Called and spoke with [name] who stated she had disability paperwork that she needed to have updated by our office. I stated to [name] that we needed to schedule her an OV due to her being overdue for an appt based on last visit with TP.  [name] expressed understanding. I scheduled [name] an OV Friday, 10/25 with Derl Barrow, NP at 12pm. [name] stated she needs 3 days notice due to transportation and she would already be here downstairs for an appt at 11am. Nothing further needed.

## 2017-11-06 ENCOUNTER — Ambulatory Visit: Payer: Medicare Other

## 2017-11-06 DIAGNOSIS — I5030 Unspecified diastolic (congestive) heart failure: Secondary | ICD-10-CM | POA: Diagnosis not present

## 2017-11-06 DIAGNOSIS — K819 Cholecystitis, unspecified: Secondary | ICD-10-CM | POA: Diagnosis not present

## 2017-11-06 DIAGNOSIS — Z434 Encounter for attention to other artificial openings of digestive tract: Secondary | ICD-10-CM | POA: Diagnosis not present

## 2017-11-06 DIAGNOSIS — I130 Hypertensive heart and chronic kidney disease with heart failure and stage 1 through stage 4 chronic kidney disease, or unspecified chronic kidney disease: Secondary | ICD-10-CM | POA: Diagnosis not present

## 2017-11-06 DIAGNOSIS — J449 Chronic obstructive pulmonary disease, unspecified: Secondary | ICD-10-CM | POA: Diagnosis not present

## 2017-11-06 DIAGNOSIS — J9611 Chronic respiratory failure with hypoxia: Secondary | ICD-10-CM | POA: Diagnosis not present

## 2017-11-07 ENCOUNTER — Ambulatory Visit (INDEPENDENT_AMBULATORY_CARE_PROVIDER_SITE_OTHER): Payer: Medicare Other | Admitting: *Deleted

## 2017-11-07 ENCOUNTER — Ambulatory Visit (HOSPITAL_COMMUNITY): Payer: Medicare Other

## 2017-11-07 ENCOUNTER — Ambulatory Visit (INDEPENDENT_AMBULATORY_CARE_PROVIDER_SITE_OTHER): Payer: Medicare Other | Admitting: Primary Care

## 2017-11-07 ENCOUNTER — Encounter: Payer: Self-pay | Admitting: Primary Care

## 2017-11-07 ENCOUNTER — Other Ambulatory Visit (INDEPENDENT_AMBULATORY_CARE_PROVIDER_SITE_OTHER): Payer: Medicare Other

## 2017-11-07 VITALS — BP 121/72 | HR 70 | Resp 18 | Ht 67.0 in | Wt 249.0 lb

## 2017-11-07 VITALS — BP 128/74 | HR 78 | Temp 97.8°F | Ht 67.0 in | Wt 249.0 lb

## 2017-11-07 DIAGNOSIS — J069 Acute upper respiratory infection, unspecified: Secondary | ICD-10-CM | POA: Insufficient documentation

## 2017-11-07 DIAGNOSIS — Z0000 Encounter for general adult medical examination without abnormal findings: Secondary | ICD-10-CM

## 2017-11-07 DIAGNOSIS — R21 Rash and other nonspecific skin eruption: Secondary | ICD-10-CM | POA: Diagnosis not present

## 2017-11-07 DIAGNOSIS — D869 Sarcoidosis, unspecified: Secondary | ICD-10-CM | POA: Diagnosis not present

## 2017-11-07 DIAGNOSIS — L299 Pruritus, unspecified: Secondary | ICD-10-CM

## 2017-11-07 LAB — CBC WITH DIFFERENTIAL/PLATELET
BASOS ABS: 0.1 10*3/uL (ref 0.0–0.1)
BASOS PCT: 0.9 % (ref 0.0–3.0)
EOS ABS: 0.4 10*3/uL (ref 0.0–0.7)
Eosinophils Relative: 6 % — ABNORMAL HIGH (ref 0.0–5.0)
HCT: 36.5 % (ref 36.0–46.0)
HEMOGLOBIN: 11.8 g/dL — AB (ref 12.0–15.0)
LYMPHS PCT: 41.2 % (ref 12.0–46.0)
Lymphs Abs: 2.8 10*3/uL (ref 0.7–4.0)
MCHC: 32.3 g/dL (ref 30.0–36.0)
MCV: 89.5 fl (ref 78.0–100.0)
Monocytes Absolute: 0.7 10*3/uL (ref 0.1–1.0)
Monocytes Relative: 10.5 % (ref 3.0–12.0)
Neutro Abs: 2.9 10*3/uL (ref 1.4–7.7)
Neutrophils Relative %: 41.4 % — ABNORMAL LOW (ref 43.0–77.0)
Platelets: 220 10*3/uL (ref 150.0–400.0)
RBC: 4.08 Mil/uL (ref 3.87–5.11)
RDW: 17.9 % — ABNORMAL HIGH (ref 11.5–15.5)
WBC: 6.9 10*3/uL (ref 4.0–10.5)

## 2017-11-07 LAB — BASIC METABOLIC PANEL
BUN: 15 mg/dL (ref 6–23)
CALCIUM: 9.4 mg/dL (ref 8.4–10.5)
CO2: 25 mEq/L (ref 19–32)
Chloride: 104 mEq/L (ref 96–112)
Creatinine, Ser: 0.99 mg/dL (ref 0.40–1.20)
GFR: 61.39 mL/min (ref >60.00)
GLUCOSE: 127 mg/dL — AB (ref 70–99)
Potassium: 3.8 mEq/L (ref 3.5–5.1)
SODIUM: 139 meq/L (ref 135–145)

## 2017-11-07 LAB — HEPATIC FUNCTION PANEL
ALK PHOS: 183 U/L — AB (ref 39–117)
ALT: 21 U/L (ref 0–35)
AST: 26 U/L (ref 0–37)
Albumin: 3.7 g/dL (ref 3.5–5.2)
BILIRUBIN DIRECT: 0.2 mg/dL (ref 0.0–0.3)
Total Bilirubin: 0.5 mg/dL (ref 0.2–1.2)
Total Protein: 7.7 g/dL (ref 6.0–8.3)

## 2017-11-07 MED ORDER — AZITHROMYCIN 250 MG PO TABS: ORAL_TABLET | ORAL | 0 refills | Status: DC

## 2017-11-07 MED ORDER — FLUTICASONE PROPIONATE 50 MCG/ACT NA SUSP: 1.0000 | Freq: Every day | NASAL | 2 refills | Status: DC

## 2017-11-07 NOTE — Progress Notes (Addendum)
Subjective:   [name] is a 57 y.o. female who presents for Medicare Annual (Subsequent) preventive examination.  Review of Systems:  No ROS.  Medicare Wellness Visit. Additional risk factors are reflected in the social history.  Cardiac Risk Factors include: advanced age (>84men, >74 women);diabetes mellitus;dyslipidemia;hypertension;sedentary lifestyle;obesity (BMI >30kg/m2) Sleep patterns: gets up 1-2 times nightly to void and sleeps 6-7 hours nightly.    Home Safety/Smoke Alarms: Feels safe in home. Smoke alarms in place.  Living environment; residence and Adult nurse: apartment, equipment: Walkers, Type: Conservation officer, nature and [employer], Type: Tub Surveyor, quantity, no firearms.Lives alone, no needs for DME, limited support system Seat Belt Safety/Bike Helmet: Wears seat belt.     Objective:     Vitals: BP 121/72   Pulse 70   Resp 18   Ht 5\' 7"  (1.702 m)   Wt 249 lb (112.9 kg)   SpO2 98% Comment: 2 L o2  BMI 39.00 kg/m   Body mass index is 39 kg/m.  Advanced Directives 11/07/2017 10/13/2017 10/02/2017 09/12/2017 06/26/2017 05/23/2017 02/17/2017  Does Patient Have a Medical Advance Directive? No Yes Yes No Yes Yes No  Type of Advance Directive - [place] will Living will;Healthcare Power of Attorney -  Does patient want to make changes to medical advance directive? No - Patient declined No - Patient declined No - Patient declined - No - Patient declined No - Patient declined Yes (MAU/Ambulatory/Procedural Areas - Information given)  Copy of [place] in Chart? - No - copy requested - - - No - copy requested -  Would patient like information on creating a medical advance directive? - No - Patient declined No - Patient declined No - Patient declined - - -    Tobacco Social History   Tobacco Use  Smoking Status Former Smoker  . Packs/day: 0.50  . Years: 10.00  . Pack years: 5.00  . Types:  Cigarettes  Smokeless Tobacco Never Used  Tobacco Comment   quit smoking in 2004     Counseling given: Not Answered Comment: quit smoking in 2004  Past Medical History:  Diagnosis Date  . Allergic rhinitis   . ALLERGIC RHINITIS 10/26/2009  . Anxiety   . ANXIETY 08/14/2006  . B12 DEFICIENCY 08/25/2007  . Complication of anesthesia    pt has had difficulty following anesthesia with her knee in 2016-unable to care for herself afterward  . Confusion   . Depression    takes Lexapro daily  . Diabetes mellitus without complication ([place])    was on insulin but has been off since Nov 2015 and now only takes Metformin daily  . DYSPNEA 04/28/2009   with exertion  . Esophageal reflux    takes Nexium daily  . Fibromyalgia   . Headache    last migraine 2-70yrs ago;takes Topamax daily  . History of shingles   . Hypertension    takes Coreg daily  . Insomnia    takes Nortriptyline nightly   . Joint pain   . Joint swelling   . Left knee pain   . Lichen planus   . Long-term memory loss   . Nocturia   . OSA (obstructive sleep apnea)    doesn't use CPAP;sleep study in epic from 2006  . Osteoarthritis   . Osteoarthritis   . Pneumonia    over 30 yrs ago  . Protein calorie malnutrition ([place])   . Rheumatoid arthritis ([place])   .  Sarcoidosis    Dr. Lolita Patella  . Short-term memory loss   . Shortness of breath   . TIA (transient ischemic attack)   . Unsteady gait   . Urinary urgency   . Vitamin D deficiency    is supposed to take Vit D but can't afford it  . VITAMIN D DEFICIENCY 08/25/2007   Past Surgical History:  Procedure Laterality Date  . APPENDECTOMY    . arthroscopic knee surgery Right 11-12-04  . AXILLARY ABCESS IRRIGATION AND DEBRIDEMENT  Jul & Aug2012  . CARPAL TUNNEL RELEASE Left 05/23/2014   Procedure: CARPAL TUNNEL RELEASE;  Surgeon: Meredith Pel, MD;  Location: [facility];  Service: Orthopedics;  Laterality: Left;  . cyst removed from top of buttocks  at age 85  .  ENDOMETRIAL ABLATION    . IR PERC CHOLECYSTOSTOMY  09/13/2017  . IR RADIOLOGIST EVAL & MGMT  10/08/2017  . LACRIMAL DUCT EXPLORATION Right 06/26/2017   Procedure: LACRIMAL DUCT EXPLORATION AND ETHMOIDECTOMY;  Surgeon: Clista Bernhardt, MD;  Location: [facility];  Service: Ophthalmology;  Laterality: Right;  . TEAR DUCT PROBING Right 06/26/2017   Procedure: TEAR DUCT PROBING WITH STENT;  Surgeon: Clista Bernhardt, MD;  Location: [facility];  Service: Ophthalmology;  Laterality: Right;  . TOTAL KNEE ARTHROPLASTY Right 11/15/2014   Procedure: TOTAL RIGHT KNEE ARTHROPLASTY;  Surgeon: Meredith Pel, MD;  Location: [facility];  Service: Orthopedics;  Laterality: Right;  . TOTAL KNEE ARTHROPLASTY Left 07/13/2015   Procedure: LEFT TOTAL KNEE ARTHROPLASTY;  Surgeon: Meredith Pel, MD;  Location: [facility];  Service: Orthopedics;  Laterality: Left;   Family History  Problem Relation Age of Onset  . Heart disease Mother   . Kidney disease Mother   . Cancer Father        leukemia  . Hypertension Unknown   . Coronary artery disease Unknown        female 1st degree relative <60  . Heart failure Unknown        congestive  . Coronary artery disease Unknown        Female 1st degree relative <50  . Breast cancer Unknown        1st degree relative <50 S  . Breast cancer Sister    Social History   Socioeconomic History  . Marital status: Single    Spouse name: Not on file  . Number of children: 1  . Years of education: Not on file  . Highest education level: Not on file  Occupational History  . Occupation: disabled  Social Needs  . Financial resource strain: Somewhat hard  . Food insecurity:    Worry: Sometimes true    Inability: Sometimes true  . Transportation needs:    Medical: No    Non-medical: Yes  Tobacco Use  . Smoking status: Former Smoker    Packs/day: 0.50    Years: 10.00    Pack years: 5.00    Types: Cigarettes  . Smokeless tobacco: Never Used  . Tobacco comment: quit smoking in 2004   Substance and Sexual Activity  . Alcohol use: No  . Drug use: No  . Sexual activity: Not Currently  Lifestyle  . Physical activity:    Days per week: 0 days    Minutes per session: 0 min  . Stress: To some extent  Relationships  . Social connections:    Talks on phone: More than three times a week    Gets together: Once a week  Attends religious service: 1 to 4 times per year    Active member of club or organization: No    Attends meetings of clubs or organizations: Never    Relationship status: Not on file  Other Topics Concern  . Not on file  Social History Narrative   Single, broke up with partner in 2008.    Outpatient Encounter Medications as of 11/07/2017  Medication Sig  . acetaminophen (TYLENOL) 325 MG tablet Take 2 tablets (650 mg total) by mouth every 6 (six) hours as needed for mild pain, fever or headache.  . augmented betamethasone dipropionate (DIPROLENE-AF) 0.05 % ointment Apply topically 2 (two) times daily.  . B-D ULTRA-FINE 33 LANCETS MISC Use as instructed to test sugars 4 times daily DX: E11.8  . budesonide-formoterol (SYMBICORT) 160-4.5 MCG/ACT inhaler Inhale 2 puffs into the lungs TWICE DAILY 120/4=30  . carvedilol (COREG) 12.5 MG tablet Take 1 tablet (12.5 mg total) by mouth 2 (two) times daily with a meal.  . clonazePAM (KLONOPIN) 0.5 MG tablet Take 1-2 tablets (0.5-1 mg total) by mouth at bedtime.  [name] esomeprazole (NEXIUM) 40 MG capsule Take 1 capsule (40 mg total) by mouth daily before breakfast.  . furosemide (LASIX) 40 MG tablet TAKE 1 TABLET BY MOUTH EVERY MORNING FOR EDEMA (Patient taking differently: Take 20 mg by mouth daily as needed for fluid or edema. )  . gabapentin (NEURONTIN) 300 MG capsule Take 1 capsule (300 mg total) by mouth 2 (two) times daily as needed (for pain).  [name] glucose blood (EASY TALK BLOOD GLUCOSE TEST) test strip Use as instructed 2x a day  . hydrOXYzine (ATARAX/VISTARIL) 25 MG tablet Take 1 tablet (25 mg total) by mouth every  8 (eight) hours as needed for itching.  . metFORMIN (GLUCOPHAGE-XR) 500 MG 24 hr tablet TAKE 2 TABLETS(1000 MG) BY MOUTH DAILY WITH BREAKFAST  . topiramate (TOPAMAX) 50 MG tablet Take 1 tablet (50 mg total) by mouth 2 (two) times daily.  [name] triamcinolone cream (KENALOG) 0.1 % Apply 1 application topically 2 (two) times daily.  [name] venlafaxine XR (EFFEXOR-XR) 75 MG 24 hr capsule Take 1 capsule (75 mg total) by mouth daily with breakfast.  . VENTOLIN HFA 108 (90 Base) MCG/ACT inhaler Inhale 2 puffs into the lungs every 6 (six) hours as needed for wheezing or shortness of breath. 200/8=25 (Patient taking differently: Inhale 2 puffs into the lungs every 6 (six) hours as needed for wheezing or shortness of breath. )  . VOLTAREN 1 % GEL Apply 2 g topically 2 (two) times daily as needed (for pain). 100/4=25 (Patient taking differently: Apply 4 g topically 4 (four) times daily as needed (pain). )  . XARELTO 20 MG TABS tablet Take 1 tablet (20 mg total) by mouth at bedtime.  . Adalimumab (HUMIRA) 40 MG/0.8ML PSKT Inject 40 mg into the skin every 7 (seven) days.   . [DISCONTINUED] oxyCODONE (OXY IR/ROXICODONE) 5 MG immediate release tablet Take 1 tablet (5 mg total) by mouth every 4 (four) hours as needed for moderate pain. (Patient not taking: Reported on 11/07/2017)  . [DISCONTINUED] polyethylene glycol (MIRALAX / GLYCOLAX) packet Take 17 g by mouth daily. (Patient not taking: Reported on 11/07/2017)  . [DISCONTINUED] potassium chloride (K-DUR) 10 MEQ tablet Take 1 tablet (10 mEq total) by mouth daily. (Patient not taking: Reported on 11/07/2017)   No facility-administered encounter medications on file as of 11/07/2017.     Activities of Daily Living In your present state of health, do you have any  difficulty performing the following activities: 11/07/2017 10/14/2017  Hearing? N N  Vision? N N  Comment - -  Difficulty concentrating or making decisions? N N  Walking or climbing stairs? Y Y  Dressing or  bathing? N N  Doing errands, shopping? [name]  Preparing Food and eating ? N -  Using the Toilet? N -  In the past six months, have you accidently leaked urine? N -  Do you have problems with loss of bowel control? N -  Managing your Medications? N -  Managing your Finances? N -  Housekeeping or managing your Housekeeping? Y -  Some recent data might be hidden    Patient Care Team: Plotnikov, Evie Lacks, MD as PCP - Huston Foley, MD as Consulting Physician (General Surgery) Renato Shin, MD as Attending Physician (Internal Medicine) Marlou Sa, Tonna Corner, MD (Orthopedic Surgery) Bo Merino, MD as Consulting Physician (Rheumatology) Dian Situ, MD (Pain Medicine) Bo Merino, MD as Consulting Physician (Rheumatology) Rolm Bookbinder, MD as Consulting Physician (Dermatology) Rigoberto Noel, MD as Consulting Physician (Pulmonary Disease) Josue Hector, MD as Consulting Physician (Cardiology) Enriqueta Shutter, MD as Referring Physician (Dermatology) Alanda Slim, Neena Rhymes, MD as Consulting Physician (Ophthalmology) Valente David, RN as [place] Management    Assessment:   This is a routine wellness examination for [place]. Physical assessment deferred to PCP.   Exercise Activities and Dietary recommendations Current Exercise Habits: The patient does not participate in regular exercise at present, Exercise limited by: orthopedic condition(s);respiratory conditions(s)  Diet (meal preparation, eat out, water intake, caffeinated beverages, dairy products, fruits and vegetables): in general, a "healthy" diet  , well balanced. eats a variety of fruits and vegetables daily, limits salt, fat/cholesterol, sugar,carbohydrates,caffeine, drinks 6-8 glasses of water daily.  Reviewed heart healthy and diabetic diet  Goals    . Patient Stated     I am going to get back into going to pulmonary rehab. Enjoy and love my son and grandchild.     . Stay as  independent as possible     Continue to work with physical therapy, go to pulmonary physical therapy, and do some chair exercises to get me stronger and more mobile.       Fall Risk Fall Risk  11/07/2017 10/06/2017 10/02/2017 02/17/2017 01/15/2017  Falls in the past year? No No No Yes Yes  Number falls in past yr: - - - 2 or more 2 or more  Injury with Fall? - - - Yes Yes  Risk Factor Category  - - - High Fall Risk High Fall Risk  Risk for fall due to : Impaired balance/gait;Impaired mobility - - History of fall(s);Medication side effect;Impaired balance/gait;Impaired mobility History of fall(s);Impaired balance/gait;Impaired mobility  Follow up - - - Falls evaluation completed;Education provided;Falls prevention discussed Education provided;Falls prevention discussed    Depression Screen PHQ 2/9 Scores 11/07/2017 10/06/2017 10/02/2017 02/17/2017  PHQ - 2 Score 3 0 0 2  PHQ- 9 Score 8 - - 7     Cognitive Function MMSE - Mini Mental State Exam 11/06/2016  Orientation to time 5  Orientation to Place 5  Registration 3  Attention/ Calculation 3  Recall 1  Language- name 2 objects 2  Language- repeat 1  Language- follow 3 step command 3  Language- read & follow direction 1  Write a sentence 1  Copy design 1  Total score 26       Ad8 score reviewed for issues:  Issues making decisions: no  Less interest in hobbies / activities: no  Repeats questions, stories (family complaining): no  Trouble using ordinary gadgets (microwave, computer, phone):no  Forgets the month or year: no  Mismanaging finances: no  Remembering appts: no  Daily problems with thinking and/or memory: no Ad8 score is= 0  Immunization History  Administered Date(s) Administered  . H1N1 12/24/2007  . Influenza Split 10/11/2010  . Influenza Whole 10/14/2007, 10/05/2008, 10/26/2009, 11/13/2011  . Influenza,inj,Quad PF,6+ Mos 09/21/2012, 09/24/2013, 10/06/2014, 09/05/2015, 11/06/2016, 09/24/2017  . PPD Test  11/18/2014  . Pneumococcal Conjugate-13 07/30/2010, 12/29/2012  . Pneumococcal Polysaccharide-23 12/23/2013  . Tdap 06/28/2014   Screening Tests Health Maintenance  Topic Date Due  . Hepatitis C Screening  [birth year]  . COLONOSCOPY  07/27/2010  . OPHTHALMOLOGY EXAM  10/13/2015  . URINE MICROALBUMIN  02/01/2016  . PAP SMEAR  11/07/2017 (Originally 07/26/1981)  . FOOT EXAM  04/03/2018  . HEMOGLOBIN A1C  04/29/2018  . MAMMOGRAM  12/11/2018  . TETANUS/TDAP  06/27/2024  . INFLUENZA VACCINE  Completed  . PNEUMOCOCCAL POLYSACCHARIDE VACCINE AGE 19-64 HIGH RISK  Completed  . HIV Screening  Completed      Plan:     Community resources provided.  Continue doing brain stimulating activities (puzzles, reading, adult coloring books, staying active) to keep memory sharp.   Continue to eat heart healthy diet (full of fruits, vegetables, whole grains, lean protein, water--limit salt, fat, and sugar intake) and increase physical activity as tolerated.  I have personally reviewed and noted the following in the patient's chart:   . Medical and social history . Use of alcohol, tobacco or illicit drugs  . Current medications and supplements . Functional ability and status . Nutritional status . Physical activity . Advanced directives . List of other physicians . Hospitalizations, surgeries, and ER visits in previous 12 months . Vitals . Screenings to include cognitive, depression, and falls . Referrals and appointments  In addition, I have reviewed and discussed with patient certain preventive protocols, quality metrics, and best practice recommendations. A written personalized care plan for preventive services as well as general preventive health recommendations were provided to patient.     Michiel Cowboy, RN  11/07/2017   Medical screening examination/treatment/procedure(s) were performed by non-physician practitioner and as supervising provider I was immediately available for  consultation/collaboration.  I agree with above. Marrian Salvage, FNP

## 2017-11-07 NOTE — Progress Notes (Signed)
@Patient  ID: [name], female    DOB: 1960/12/25, 57 y.o.   MRN: [id number]  Chief Complaint  Patient presents with  . Follow-up    pt states breathing has improved since last OV. c/o sob with walking long distances    Referring provider: Plotnikov, Evie Lacks, MD  HPI: 57 year old female, former smoker. 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 Started Humira d/t cutaneous lesion not responding to Methotrexate.   Remains on 2L oxygen, attended pulmonary rehab. Continue Symbicort twice daily. Mild obstruction.  11/07/2017 Patient presents today for office visit. Breathing as been ok, no exacerbations. Acute complaints of cough with green mucus and left ear fullness. Cough appears somewhat at patient baseline. Continues Symbicort twice daily. Requires rescue inhaler once every 3 months. Humira is currently on Hold since Oct 15th. Recent hosp admission in September for transaminitis and abdominal pain, dx cholecystitis with possible intrahepatic and pericholcystic abscess. Not candidate for surgery d/t sarcoidosis. Percutaneous drain placed 8/31, tx with bactrim x 2 week. Drain capped on 09/25. Receiving home nursing every 2 days for dressing changes. Needs information of most recent hosp admissions to help fill out disability forms.  = Denies sinus congestion, wheeze or sob.     Allergies  Allergen Reactions  . Belsomra [Suvorexant] Other (See Comments)    [place] out of bed while asleep: "I'm waking up as I'm falling on the floor;" "Night terrors"  . Enalapril Maleate Cough  . Latex Hives  . Nickel Other (See Comments)    Blisters  Pt has a titanium right and left knee - nickel causes skin irritations that form into blisters and sores  . Other Other (See Comments)    Patient has Sarcoidosis and can't tolerate any metals  . Iodine Other (See Comments)    Patient received IV contrast without pre-meds without any problems  09/12/17 kwl Mother was intolerant--CODED on CT table---pt never tried  . Doxycycline Diarrhea and Nausea And Vomiting  . Hydrochlorothiazide Other (See Comments)    Low potassium levels   . Hydroxychloroquine Sulfate Other (See Comments)    Vision changes   . Lyrica [Pregabalin] Other (See Comments)    "Made me feel high"  . Tape Other (See Comments)    Medical tape causes bruising    Immunization History  Administered Date(s) Administered  . H1N1 12/24/2007  . Influenza Split 10/11/2010  . Influenza Whole 10/14/2007, 10/05/2008, 10/26/2009, 11/13/2011  . Influenza,inj,Quad PF,6+ Mos 09/21/2012, 09/24/2013, 10/06/2014, 09/05/2015, 11/06/2016, 09/24/2017  . PPD Test 11/18/2014  . Pneumococcal Conjugate-13 07/30/2010, 12/29/2012  . Pneumococcal Polysaccharide-23 12/23/2013  . Tdap 06/28/2014    Past Medical History:  Diagnosis Date  . Allergic rhinitis   . ALLERGIC RHINITIS 10/26/2009  . Anxiety   . ANXIETY 08/14/2006  . B12 DEFICIENCY 08/25/2007  . Complication of anesthesia    pt has had difficulty following anesthesia with her knee in 2016-unable to care for herself afterward  . Confusion   . Depression    takes Lexapro daily  . Diabetes mellitus without complication ([place])    was on insulin but has been off since Nov 2015 and now only takes Metformin daily  . DYSPNEA 04/28/2009   with exertion  . Esophageal reflux    takes Nexium daily  . Fibromyalgia   . Headache    last migraine 2-10yrs ago;takes Topamax daily  . History of shingles   . Hypertension    takes Coreg daily  .  Insomnia    takes Nortriptyline nightly   . Joint pain   . Joint swelling   . Left knee pain   . Lichen planus   . Long-term memory loss   . Nocturia   . OSA (obstructive sleep apnea)    doesn't use CPAP;sleep study in epic from 2006  . Osteoarthritis   . Osteoarthritis   . Pneumonia    over 30 yrs ago  . Protein calorie malnutrition ([place])   . Rheumatoid arthritis ([place])   . Sarcoidosis     Dr. Lolita Patella  . Short-term memory loss   . Shortness of breath   . TIA (transient ischemic attack)   . Unsteady gait   . Urinary urgency   . Vitamin D deficiency    is supposed to take Vit D but can't afford it  . VITAMIN D DEFICIENCY 08/25/2007    Tobacco History: Social History   Tobacco Use  Smoking Status Former Smoker  . Packs/day: 0.50  . Years: 10.00  . Pack years: 5.00  . Types: Cigarettes  Smokeless Tobacco Never Used  Tobacco Comment   quit smoking in 2004   Counseling given: Not Answered Comment: quit smoking in 2004   Outpatient Medications Prior to Visit  Medication Sig Dispense Refill  . acetaminophen (TYLENOL) 325 MG tablet Take 2 tablets (650 mg total) by mouth every 6 (six) hours as needed for mild pain, fever or headache.    . Adalimumab (HUMIRA) 40 MG/0.8ML PSKT Inject 40 mg into the skin every 7 (seven) days.     [name] augmented betamethasone dipropionate (DIPROLENE-AF) 0.05 % ointment Apply topically 2 (two) times daily.  8  . B-D ULTRA-FINE 33 LANCETS MISC Use as instructed to test sugars 4 times daily DX: E11.8 400 each 1  . budesonide-formoterol (SYMBICORT) 160-4.5 MCG/ACT inhaler Inhale 2 puffs into the lungs TWICE DAILY 120/4=30 10.2 g 0  . carvedilol (COREG) 12.5 MG tablet Take 1 tablet (12.5 mg total) by mouth 2 (two) times daily with a meal. 180 tablet 3  . clonazePAM (KLONOPIN) 0.5 MG tablet Take 1-2 tablets (0.5-1 mg total) by mouth at bedtime. 60 tablet 3  . esomeprazole (NEXIUM) 40 MG capsule Take 1 capsule (40 mg total) by mouth daily before breakfast. 90 capsule 1  . furosemide (LASIX) 40 MG tablet TAKE 1 TABLET BY MOUTH EVERY MORNING FOR EDEMA (Patient taking differently: Take 20 mg by mouth daily as needed for fluid or edema. ) 90 tablet 1  . gabapentin (NEURONTIN) 300 MG capsule Take 1 capsule (300 mg total) by mouth 2 (two) times daily as needed (for pain). 180 capsule 1  . glucose blood (EASY TALK BLOOD GLUCOSE TEST) test strip Use as  instructed 2x a day 200 each 12  . hydrOXYzine (ATARAX/VISTARIL) 25 MG tablet Take 1 tablet (25 mg total) by mouth every 8 (eight) hours as needed for itching. 60 tablet 1  . metFORMIN (GLUCOPHAGE-XR) 500 MG 24 hr tablet TAKE 2 TABLETS(1000 MG) BY MOUTH DAILY WITH BREAKFAST    . topiramate (TOPAMAX) 50 MG tablet Take 1 tablet (50 mg total) by mouth 2 (two) times daily. 180 tablet 1  . triamcinolone cream (KENALOG) 0.1 % Apply 1 application topically 2 (two) times daily. 80 g 2  . venlafaxine XR (EFFEXOR-XR) 75 MG 24 hr capsule Take 1 capsule (75 mg total) by mouth daily with breakfast. 30 capsule 11  . VENTOLIN HFA 108 (90 Base) MCG/ACT inhaler Inhale 2 puffs into the lungs every 6 (  six) hours as needed for wheezing or shortness of breath. 200/8=25 (Patient taking differently: Inhale 2 puffs into the lungs every 6 (six) hours as needed for wheezing or shortness of breath. ) 18 g 11  . VOLTAREN 1 % GEL Apply 2 g topically 2 (two) times daily as needed (for pain). 100/4=25 (Patient taking differently: Apply 4 g topically 4 (four) times daily as needed (pain). ) 100 g 2  . XARELTO 20 MG TABS tablet Take 1 tablet (20 mg total) by mouth at bedtime. 90 tablet 3   No facility-administered medications prior to visit.     Review of Systems  Review of Systems  Constitutional: Negative.   HENT: Negative.   Respiratory: Positive for cough. Negative for apnea, choking, chest tightness, shortness of breath, wheezing and stridor.   Cardiovascular: Negative.     Physical Exam  BP 128/74 (BP Location: Left Arm, Cuff Size: Normal)   Pulse 78   Temp 97.8 F (36.6 C) (Oral)   Ht 5\' 7"  (1.702 m)   Wt 249 lb (112.9 kg)   SpO2 98%   BMI 39.00 kg/m  Physical Exam  Constitutional: She is oriented to person, place, and time. She appears well-developed and well-nourished. No distress.  Obese  HENT:  Head: Normocephalic and atraumatic.  Right Ear: Hearing, tympanic membrane and ear canal normal.  Left  Ear: Hearing, tympanic membrane and ear canal normal.  Nose: No mucosal edema, rhinorrhea or sinus tenderness.  Eyes: Pupils are equal, round, and reactive to light. EOM are normal.  Neck: Normal range of motion. Neck supple.  Cardiovascular: Normal rate and regular rhythm.  Pulmonary/Chest: Effort normal and breath sounds normal.  CTA  Abdominal:  Perc drain  Musculoskeletal:  Rolling walker  Neurological: She is alert and oriented to person, place, and time.  Skin: Skin is warm and dry.  Psychiatric: She has a normal mood and affect. Her behavior is normal. Thought content normal.     Lab Results:  CBC    Component Value Date/Time   WBC 6.9 11/07/2017 0949   RBC 4.08 11/07/2017 0949   HGB 11.8 (L) 11/07/2017 0949   HGB 13.4 12/06/2013 0930   HCT 36.5 11/07/2017 0949   HCT 43.0 12/06/2013 0930   PLT 220.0 11/07/2017 0949   PLT 216 12/06/2013 0930   MCV 89.5 11/07/2017 0949   MCV 92.1 12/06/2013 0930   MCH 28.2 10/15/2017 0331   MCHC 32.3 11/07/2017 0949   RDW 17.9 (H) 11/07/2017 0949   RDW 14.5 12/06/2013 0930   LYMPHSABS 2.8 11/07/2017 0949   LYMPHSABS 1.7 12/06/2013 0930   MONOABS 0.7 11/07/2017 0949   MONOABS 0.6 12/06/2013 0930   EOSABS 0.4 11/07/2017 0949   EOSABS 0.1 12/06/2013 0930   BASOSABS 0.1 11/07/2017 0949   BASOSABS 0.0 12/06/2013 0930    BMET    Component Value Date/Time   NA 139 11/07/2017 0949   NA 144 07/19/2015   NA 140 12/06/2013 0930   K 3.8 11/07/2017 0949   K 3.7 12/06/2013 0930   CL 104 11/07/2017 0949   CO2 25 11/07/2017 0949   CO2 24 12/06/2013 0930   GLUCOSE 127 (H) 11/07/2017 0949   GLUCOSE 110 12/06/2013 0930   BUN 15 11/07/2017 0949   BUN 12 07/19/2015   BUN 11.0 12/06/2013 0930   CREATININE 0.99 11/07/2017 0949   CREATININE 1.1 12/06/2013 0930   CALCIUM 9.4 11/07/2017 0949   CALCIUM 9.4 12/06/2013 0930   GFRNONAA >60 10/17/2017 0436  GFRAA >60 10/17/2017 0436    BNP    Component Value Date/Time   BNP 204.1 (H)  09/12/2017 1900    ProBNP    Component Value Date/Time   PROBNP 194.0 (H) 11/11/2016 1051    Imaging: Nm Hepatobiliary Liver Func  Result Date: 10/16/2017 CLINICAL DATA:  Acute cholecystitis, status post cholecystostomy, inflammation on CT/ultrasound EXAM: NUCLEAR MEDICINE HEPATOBILIARY IMAGING TECHNIQUE: Sequential images of the abdomen were obtained out to 60 minutes following intravenous administration of radiopharmaceutical. RADIOPHARMACEUTICALS:  7.49 mCi Tc-40m  Choletec IV COMPARISON:  Right upper quadrant ultrasound dated 10/14/2017. CT abdomen/pelvis dated 10/13/2017. FINDINGS: Prompt uptake and biliary excretion of activity by the liver is seen. Biliary activity passes into small bowel, consistent with patent common bile duct. Following morphine administration, gallbladder activity is visualized, consistent with patency of cystic duct. IMPRESSION: Patent cystic and common duct in this patient status post cholecystostomy for acute cholecystitis. Electronically Signed   By: Julian Hy M.D.   On: 10/16/2017 23:03   Ct Abdomen Pelvis W Contrast  Result Date: 10/13/2017 CLINICAL DATA:  Abdominal pain, recent cholecystitis, status post percutaneous cholecystostomy 09/12/2017 EXAM: CT ABDOMEN AND PELVIS WITH CONTRAST TECHNIQUE: Multidetector CT imaging of the abdomen and pelvis was performed using the standard protocol following bolus administration of intravenous contrast. CONTRAST:  146mL ISOVUE-300 IOPAMIDOL (ISOVUE-300) INJECTION 61% COMPARISON:  09/12/2017 FINDINGS: Lower chest: Similar bibasilar nodular scarring/atelectasis. No pleural or pericardial effusion. Normal heart size for age. Degenerative changes of the thoracic spine on the right. Hepatobiliary: Mild hypoattenuation of the liver suggesting hepatic steatosis. No focal hepatic abnormality or biliary obstruction. No surrounding perihepatic free fluid, hemorrhage, or subcapsular hematoma. Interval right upper quadrant  transhepatic cholecystostomy within the gallbladder. There is interval improvement in the gallbladder distension and surrounding pericholecystic fluid inflammation. Gallbladder is decompressed. Common bile duct nondilated. Pancreas: Unremarkable. No pancreatic ductal dilatation or surrounding inflammatory changes. Spleen: Normal in size without focal abnormality. Adrenals/Urinary Tract: Normal adrenal glands. Normal symmetric renal enhancement and excretion. 12 mm right renal cortical exophytic cyst as before. Ureters are symmetric and decompressed. No obstructing ureteral calculus. Bladder is underdistended. Stomach/Bowel: Negative for bowel obstruction, significant dilatation, ileus, or free air. Scattered colonic diverticulosis. Remote appendectomy noted. No fluid collection or abscess. No ascites. Vascular/Lymphatic: Aortic atherosclerosis. Negative for aneurysm or occlusive process. Mesenteric and renal vasculature appear patent. No adenopathy. Reproductive: Uterus and adnexa normal in size. Uterus is retroverted. No pelvic fluid collection, hemorrhage, abscess or hematoma. Other: No abdominal wall hernia or abnormality. No abdominopelvic ascites. Musculoskeletal: Degenerative changes of the spine. No acute osseous finding. Lower lumbar facet arthropathy noted IMPRESSION: Status post interval percutaneous transhepatic cholecystostomy. Cholecystostomy catheter in good position within the gallbladder. There is improvement in the gallbladder distention, pericholecystic fluid and inflammation compatible with resolving cholecystitis. No biliary dilatation or obstruction Mild hepatic steatosis No fluid collection or abscess Other chronic findings as above Electronically Signed   By: Jerilynn Mages.  Shick M.D.   On: 10/13/2017 22:23   US Abdomen Limited Ruq  Result Date: 10/14/2017 CLINICAL DATA:  Right upper quadrant pain, status post cholecystostomy EXAM: ULTRASOUND ABDOMEN LIMITED RIGHT UPPER QUADRANT COMPARISON:  CT  abdomen/pelvis dated 10/13/2017 FINDINGS: Gallbladder: Mild wall thickening with layering sludge and indwelling cholecystostomy tube. No definite pericholecystic fluid. Positive sonographic Murphy's sign. Common bile duct: Diameter: 6 mm Liver: Hyperechoic hepatic parenchyma with coarse hepatic echotexture. No focal hepatic lesion is seen. Portal vein is patent on color Doppler imaging with normal direction of blood flow towards the liver.  IMPRESSION: Cholecystostomy tube in satisfactory position. Associated gallbladder wall thickening with layering sludge. Hyperechoic hepatic parenchyma with coarse hepatic echotexture, suggesting hepatic steatosis. Electronically Signed   By: Julian Hy M.D.   On: 10/14/2017 01:18     Assessment & Plan:   Sarcoidosis - Breathing stable; no recent exacerbations - Continues Symbicort 160 two puffs BID - Rarely requires rescue inhaler, once every 3 months - Humira on hold since Oct 15th d/t recent cholecystitis  - Plan FU in 6 months with PFTs   URI (upper respiratory infection) - Rx Zpack  - Enc flonase daily  - If no improvement, instructed to use debrox BID x 3 days and return for left ear lavage    Martyn Ehrich, NP 11/07/2017

## 2017-11-07 NOTE — Patient Instructions (Addendum)
Continue doing brain stimulating activities (puzzles, reading, adult coloring books, staying active) to keep memory sharp.   Continue to eat heart healthy diet (full of fruits, vegetables, whole grains, lean protein, water--limit salt, fat, and sugar intake) and increase physical activity as tolerated.  www.auntbertha.com or down load app on smart phone  [name] website lists multiple social resources for individuals such as: food, health, money, house hold goods, transit, medical supplies, job training and legal services.   [name] , Thank you for taking time to come for your Medicare Wellness Visit. I appreciate your ongoing commitment to your health goals. Please review the following plan we discussed and let me know if I can assist you in the future.   These are the goals we discussed: Goals    . Patient Stated     I am going to get back into going to pulmonary rehab. Enjoy and love my son and grandchild.     . Stay as independent as possible     Continue to work with physical therapy, go to pulmonary physical therapy, and do some chair exercises to get me stronger and more mobile.       This is a list of the screening recommended for you and due dates:  Health Maintenance  Topic Date Due  .  Hepatitis C: One time screening is recommended by Center for Disease Control  (CDC) for  adults born from [birth year] through 1965.   1960-05-13  . Pap Smear  07/26/1981  . Colon Cancer Screening  07/27/2010  . Eye exam for diabetics  10/13/2015  . Urine Protein Check  02/01/2016  . Complete foot exam   04/03/2018  . Hemoglobin A1C  04/29/2018  . Mammogram  12/11/2018  . Tetanus Vaccine  06/27/2024  . Flu Shot  Completed  . Pneumococcal vaccine  Completed  . HIV Screening  Completed   Health Maintenance, Female Adopting a healthy lifestyle and getting preventive care can go a long way to promote health and wellness. Talk with your health care provider about what schedule of  regular examinations is right for you. This is a good chance for you to check in with your provider about disease prevention and staying healthy. In between checkups, there are plenty of things you can do on your own. Experts have done a lot of research about which lifestyle changes and preventive measures are most likely to keep you healthy. Ask your health care provider for more information. Weight and diet Eat a healthy diet  Be sure to include plenty of vegetables, fruits, low-fat dairy products, and lean protein.  Do not eat a lot of foods high in solid fats, added sugars, or salt.  Get regular exercise. This is one of the most important things you can do for your health. ? Most adults should exercise for at least 150 minutes each week. The exercise should increase your heart rate and make you sweat (moderate-intensity exercise). ? Most adults should also do strengthening exercises at least twice a week. This is in addition to the moderate-intensity exercise.  Maintain a healthy weight  Body mass index (BMI) is a measurement that can be used to identify possible weight problems. It estimates body fat based on height and weight. Your health care provider can help determine your BMI and help you achieve or maintain a healthy weight.  For females 70 years of age and older: ? A BMI below 18.5 is considered underweight. ? A BMI of 18.5 to 24.9  is normal. ? A BMI of 25 to 29.9 is considered overweight. ? A BMI of 30 and above is considered obese.  Watch levels of cholesterol and blood lipids  You should start having your blood tested for lipids and cholesterol at 57 years of age, then have this test every 5 years.  You may need to have your cholesterol levels checked more often if: ? Your lipid or cholesterol levels are high. ? You are older than 57 years of age. ? You are at high risk for heart disease.  Cancer screening Lung Cancer  Lung cancer screening is recommended for adults  31-39 years old who are at high risk for lung cancer because of a history of smoking.  A yearly low-dose CT scan of the lungs is recommended for people who: ? Currently smoke. ? Have quit within the past 15 years. ? Have at least a 30-pack-year history of smoking. A pack year is smoking an average of one pack of cigarettes a day for 1 year.  Yearly screening should continue until it has been 15 years since you quit.  Yearly screening should stop if you develop a health problem that would prevent you from having lung cancer treatment.  Breast Cancer  Practice breast self-awareness. This means understanding how your breasts normally appear and feel.  It also means doing regular breast self-exams. Let your health care provider know about any changes, no matter how small.  If you are in your 20s or 30s, you should have a clinical breast exam (CBE) by a health care provider every 1-3 years as part of a regular health exam.  If you are 28 or older, have a CBE every year. Also consider having a breast X-ray (mammogram) every year.  If you have a family history of breast cancer, talk to your health care provider about genetic screening.  If you are at high risk for breast cancer, talk to your health care provider about having an MRI and a mammogram every year.  Breast cancer gene (BRCA) assessment is recommended for women who have family members with BRCA-related cancers. BRCA-related cancers include: ? Breast. ? Ovarian. ? Tubal. ? Peritoneal cancers.  Results of the assessment will determine the need for genetic counseling and BRCA1 and BRCA2 testing.  Cervical Cancer Your health care provider may recommend that you be screened regularly for cancer of the pelvic organs (ovaries, uterus, and vagina). This screening involves a pelvic examination, including checking for microscopic changes to the surface of your cervix (Pap test). You may be encouraged to have this screening done every 3  years, beginning at age 16.  For women ages 44-65, health care providers may recommend pelvic exams and Pap testing every 3 years, or they may recommend the Pap and pelvic exam, combined with testing for human papilloma virus (HPV), every 5 years. Some types of HPV increase your risk of cervical cancer. Testing for HPV may also be done on women of any age with unclear Pap test results.  Other health care providers may not recommend any screening for nonpregnant women who are considered low risk for pelvic cancer and who do not have symptoms. Ask your health care provider if a screening pelvic exam is right for you.  If you have had past treatment for cervical cancer or a condition that could lead to cancer, you need Pap tests and screening for cancer for at least 20 years after your treatment. If Pap tests have been discontinued, your risk factors (such  as having a new sexual partner) need to be reassessed to determine if screening should resume. Some women have medical problems that increase the chance of getting cervical cancer. In these cases, your health care provider may recommend more frequent screening and Pap tests.  Colorectal Cancer  This type of cancer can be detected and often prevented.  Routine colorectal cancer screening usually begins at 57 years of age and continues through 57 years of age.  Your health care provider may recommend screening at an earlier age if you have risk factors for colon cancer.  Your health care provider may also recommend using home test kits to check for hidden blood in the stool.  A small camera at the end of a tube can be used to examine your colon directly (sigmoidoscopy or colonoscopy). This is done to check for the earliest forms of colorectal cancer.  Routine screening usually begins at age 59.  Direct examination of the colon should be repeated every 5-10 years through 57 years of age. However, you may need to be screened more often if early  forms of precancerous polyps or small growths are found.  Skin Cancer  Check your skin from head to toe regularly.  Tell your health care provider about any new moles or changes in moles, especially if there is a change in a mole's shape or color.  Also tell your health care provider if you have a mole that is larger than the size of a pencil eraser.  Always use sunscreen. Apply sunscreen liberally and repeatedly throughout the day.  Protect yourself by wearing long sleeves, pants, a wide-brimmed hat, and sunglasses whenever you are outside.  Heart disease, diabetes, and high blood pressure  High blood pressure causes heart disease and increases the risk of stroke. High blood pressure is more likely to develop in: ? People who have blood pressure in the high end of the normal range (130-139/85-89 mm Hg). ? People who are overweight or obese. ? People who are African American.  If you are 11-18 years of age, have your blood pressure checked every 3-5 years. If you are 50 years of age or older, have your blood pressure checked every year. You should have your blood pressure measured twice-once when you are at a hospital or clinic, and once when you are not at a hospital or clinic. Record the average of the two measurements. To check your blood pressure when you are not at a hospital or clinic, you can use: ? An automated blood pressure machine at a pharmacy. ? A home blood pressure monitor.  If you are between 62 years and 35 years old, ask your health care provider if you should take aspirin to prevent strokes.  Have regular diabetes screenings. This involves taking a blood sample to check your fasting blood sugar level. ? If you are at a normal weight and have a low risk for diabetes, have this test once every three years after 57 years of age. ? If you are overweight and have a high risk for diabetes, consider being tested at a younger age or more often. Preventing infection Hepatitis  B  If you have a higher risk for hepatitis B, you should be screened for this virus. You are considered at high risk for hepatitis B if: ? You were born in a country where hepatitis B is common. Ask your health care provider which countries are considered high risk. ? Your parents were born in a high-risk country, and you  have not been immunized against hepatitis B (hepatitis B vaccine). ? You have HIV or AIDS. ? You use needles to inject street drugs. ? You live with someone who has hepatitis B. ? You have had sex with someone who has hepatitis B. ? You get hemodialysis treatment. ? You take certain medicines for conditions, including cancer, organ transplantation, and autoimmune conditions.  Hepatitis C  Blood testing is recommended for: ? Everyone born from [birth year] through 1965. ? Anyone with known risk factors for hepatitis C.  Sexually transmitted infections (STIs)  You should be screened for sexually transmitted infections (STIs) including gonorrhea and chlamydia if: ? You are sexually active and are younger than 57 years of age. ? You are older than 57 years of age and your health care provider tells you that you are at risk for this type of infection. ? Your sexual activity has changed since you were last screened and you are at an increased risk for chlamydia or gonorrhea. Ask your health care provider if you are at risk.  If you do not have HIV, but are at risk, it may be recommended that you take a prescription medicine daily to prevent HIV infection. This is called pre-exposure prophylaxis (PrEP). You are considered at risk if: ? You are sexually active and do not regularly use condoms or know the HIV status of your partner(s). ? You take drugs by injection. ? You are sexually active with a partner who has HIV.  Talk with your health care provider about whether you are at high risk of being infected with HIV. If you choose to begin PrEP, you should first be tested for HIV. You  should then be tested every 3 months for as long as you are taking PrEP. Pregnancy  If you are premenopausal and you may become pregnant, ask your health care provider about preconception counseling.  If you may become pregnant, take 400 to 800 micrograms (mcg) of folic acid every day.  If you want to prevent pregnancy, talk to your health care provider about birth control (contraception). Osteoporosis and menopause  Osteoporosis is a disease in which the bones lose minerals and strength with aging. This can result in serious bone fractures. Your risk for osteoporosis can be identified using a bone density scan.  If you are 26 years of age or older, or if you are at risk for osteoporosis and fractures, ask your health care provider if you should be screened.  Ask your health care provider whether you should take a calcium or vitamin D supplement to lower your risk for osteoporosis.  Menopause may have certain physical symptoms and risks.  Hormone replacement therapy may reduce some of these symptoms and risks. Talk to your health care provider about whether hormone replacement therapy is right for you. Follow these instructions at home:  Schedule regular health, dental, and eye exams.  Stay current with your immunizations.  Do not use any tobacco products including cigarettes, chewing tobacco, or electronic cigarettes.  If you are pregnant, do not drink alcohol.  If you are breastfeeding, limit how much and how often you drink alcohol.  Limit alcohol intake to no more than 1 drink per day for nonpregnant women. One drink equals 12 ounces of beer, 5 ounces of [name], or 1 ounces of hard liquor.  Do not use street drugs.  Do not share needles.  Ask your health care provider for help if you need support or information about quitting drugs.  Tell your health  care provider if you often feel depressed.  Tell your health care provider if you have ever been abused or do not feel safe  at home. This information is not intended to replace advice given to you by your health care provider. Make sure you discuss any questions you have with your health care provider. Document Released: 07/16/2010 Document Revised: 06/08/2015 Document Reviewed: 10/04/2014 Elsevier Interactive Patient Education  [employer].

## 2017-11-07 NOTE — Assessment & Plan Note (Addendum)
-   Rx Zpack  - Enc flonase daily  - If no improvement, instructed to use debrox BID x 3 days and return for left ear lavage

## 2017-11-07 NOTE — Assessment & Plan Note (Addendum)
-   Breathing stable; no recent exacerbations - Continues Symbicort 160 two puffs BID - Rarely requires rescue inhaler, once every 3 months - Humira on hold since Oct 15th d/t recent cholecystitis  - Plan FU in 6 months with PFTs

## 2017-11-07 NOTE — Patient Instructions (Addendum)
Continues Symbicort twice dailt Albuterol rescue inhaler 2 puffs every 4-6 hours for sob.wheeze  Use flonase nasal spray  Debrox ear drops twice daily x 3 days (over the counter) - only if you plan on coming in for ear wash   Zpack sent to pharmacy   FU in 6 months with Dr. Elsworth Soho and PFTs   Disability information for patient: 10/13/17- Abdominal pain. IV abx and per drain connected to bag  09/12/2017- Acute cholecystitis. 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

## 2017-11-10 ENCOUNTER — Encounter (HOSPITAL_COMMUNITY): Payer: Self-pay | Admitting: Interventional Radiology

## 2017-11-10 ENCOUNTER — Other Ambulatory Visit: Payer: Self-pay | Admitting: Surgery

## 2017-11-10 ENCOUNTER — Ambulatory Visit: Payer: Self-pay

## 2017-11-10 ENCOUNTER — Ambulatory Visit: Payer: Medicare Other | Admitting: Internal Medicine

## 2017-11-10 ENCOUNTER — Ambulatory Visit (HOSPITAL_COMMUNITY)
Admission: RE | Admit: 2017-11-10 | Discharge: 2017-11-10 | Disposition: A | Discharge status: Home / Self Care | Payer: Medicare Other | Source: Ambulatory Visit | Attending: Surgery | Admitting: Surgery

## 2017-11-10 DIAGNOSIS — K811 Chronic cholecystitis: Secondary | ICD-10-CM | POA: Diagnosis not present

## 2017-11-10 DIAGNOSIS — K819 Cholecystitis, unspecified: Secondary | ICD-10-CM

## 2017-11-10 DIAGNOSIS — Z4803 Encounter for change or removal of drains: Secondary | ICD-10-CM | POA: Insufficient documentation

## 2017-11-10 HISTORY — PX: IR EXCHANGE BILIARY DRAIN: IMG6046

## 2017-11-10 MED ORDER — LIDOCAINE HCL 1 % IJ SOLN
INTRAMUSCULAR | Status: AC
  Filled 2017-11-10: qty 20

## 2017-11-10 MED ORDER — IOPAMIDOL (ISOVUE-300) INJECTION 61%
INTRAVENOUS | Status: AC
  Administered 2017-11-10: 10 mL
  Filled 2017-11-10: qty 50

## 2017-11-10 MED ORDER — CHLORHEXIDINE GLUCONATE 4 % EX LIQD
CUTANEOUS | Status: AC
  Filled 2017-11-10: qty 15

## 2017-11-10 NOTE — Procedures (Signed)
Chronic chole tube, routine exchg  S/p fluoro exchg cholecystostomy  No comp Stable ebl 0 Tube with in GB  Cystic duct and CBD are patent

## 2017-11-10 NOTE — Telephone Encounter (Signed)
Pt. Called to report pain in mid upper abdomen that radiates to left abdomen and around to back.  Stated pain is constant.  Currently rated at 6/10.  Denied abdominal bloating.   Reported some tenderness in the mid, upper abdomen, just below the sternum.  C/o multiple diarrhea stools on Sat. and Sun.  Reported her bowel movement today was small, formed. Denied any bloody or dark tarry stools. Denied nausea or vomiting.  Reported she had chills last night.  Stated is urinating adequately.  Also reported she had a nose bleed yesterday morning that lasted about 4 hrs.  Is on Xarelto.  Denied any further nose bleed since episode yesterday.  Denied any other signs of bleeding.  Stated she has a GB drain in right abdomen, and confirmed the pain is on the left abdomen.  Reported she reported this to the Interventional Radiologist today, and was advised to notify PCP, and that it is not related to her GB drain.    Based on continuous moderate abdominal pain, advised she should be seen today within 4 hrs.  Advised to go to the ER.  The pt. stated "I'm not going to the ER."  I want an appt. at the office tomorrow.  Advised she could have something acutely going on in the abdomen.  Stated she will not go to the ER, because the last time she had to wait 12 hrs. to be seen.   Requested the appt. And stated she will go to the ER if her symptoms worsen.  Appt. given with Nurse Practitioner @ 1:00 PM on 10/29.  Care advice given per protocol.  Verb. Understanding.  Reason for Disposition . [1] MILD-MODERATE pain AND [2] constant AND [3] present > 2 hours  Answer Assessment - Initial Assessment Questions 1. LOCATION: "Where does it hurt?"      C/o pain in upper mid abdomen to left abdomen and around to her back   2. RADIATION: "Does the pain shoot anywhere else?" (e.g., chest, back)     Yes 3. ONSET: "When did the pain begin?" (e.g., minutes, hours or days ago)     Saturday evening about 8:00 PM. 4. SUDDEN: "Gradual or  sudden onset?"     gradual 5. PATTERN "Does the pain come and go, or is it constant?"    - If constant: "Is it getting better, staying the same, or worsening?"      (Note: Constant means the pain never goes away completely; most serious pain is constant and it progresses)     - If intermittent: "How long does it last?" "Do you have pain now?"     (Note: Intermittent means the pain goes away completely between bouts)     Constant 6. SEVERITY: "How bad is the pain?"  (e.g., Scale 1-10; mild, moderate, or severe)   - MILD (1-3): doesn't interfere with normal activities, abdomen soft and not tender to touch    - MODERATE (4-7): interferes with normal activities or awakens from sleep, tender to touch    - SEVERE (8-10): excruciating pain, doubled over, unable to do any normal activities     6/10 7. RECURRENT SYMPTOM: "Have you ever had this type of abdominal pain before?" If so, ask: "When was the last time?" and "What happened that time?"      No  8. CAUSE: "What do you think is causing the abdominal pain?"     Unsure 9. RELIEVING/AGGRAVATING FACTORS: "What makes it better or worse?" (e.g., movement, antacids, bowel  movement)     No; denied 10. OTHER SYMPTOMS: "Has there been any vomiting, diarrhea, constipation, or urine problems?"       Nose bleed on Sun. morning; denied nausea or vomiting; denied abd. bloating; had mult. diarrhea stools on Sat. and Sunday; mid abdominal tenderness; c/o chills last night for a few minutes ; had small formed stool this morning; denied any signs of blood in stool; urinating okay    11. PREGNANCY: "Is there any chance you are pregnant?" "When was your last menstrual period?"      N/a ; had a "procedure" several years ago  Protocols used: ABDOMINAL PAIN - [facility]

## 2017-11-11 ENCOUNTER — Encounter (HOSPITAL_COMMUNITY): Payer: Self-pay | Admitting: Emergency Medicine

## 2017-11-11 ENCOUNTER — Emergency Department (HOSPITAL_COMMUNITY)
Admission: EM | Admit: 2017-11-11 | Discharge: 2017-11-11 | Disposition: A | Discharge status: Home / Self Care | Payer: Medicare Other | Attending: Emergency Medicine | Admitting: Emergency Medicine

## 2017-11-11 ENCOUNTER — Emergency Department (HOSPITAL_COMMUNITY): Payer: Medicare Other

## 2017-11-11 ENCOUNTER — Encounter: Payer: Self-pay | Admitting: Family

## 2017-11-11 ENCOUNTER — Ambulatory Visit (INDEPENDENT_AMBULATORY_CARE_PROVIDER_SITE_OTHER): Payer: Medicare Other | Admitting: Family

## 2017-11-11 VITALS — BP 148/88 | HR 76 | Temp 98.3°F | Ht 67.0 in | Wt 252.0 lb

## 2017-11-11 DIAGNOSIS — Z434 Encounter for attention to other artificial openings of digestive tract: Secondary | ICD-10-CM | POA: Diagnosis not present

## 2017-11-11 DIAGNOSIS — I130 Hypertensive heart and chronic kidney disease with heart failure and stage 1 through stage 4 chronic kidney disease, or unspecified chronic kidney disease: Secondary | ICD-10-CM | POA: Diagnosis not present

## 2017-11-11 DIAGNOSIS — J449 Chronic obstructive pulmonary disease, unspecified: Secondary | ICD-10-CM | POA: Insufficient documentation

## 2017-11-11 DIAGNOSIS — R1084 Generalized abdominal pain: Secondary | ICD-10-CM | POA: Diagnosis present

## 2017-11-11 DIAGNOSIS — R109 Unspecified abdominal pain: Secondary | ICD-10-CM

## 2017-11-11 DIAGNOSIS — R1013 Epigastric pain: Secondary | ICD-10-CM | POA: Diagnosis not present

## 2017-11-11 DIAGNOSIS — J9611 Chronic respiratory failure with hypoxia: Secondary | ICD-10-CM | POA: Diagnosis not present

## 2017-11-11 DIAGNOSIS — Z7984 Long term (current) use of oral hypoglycemic drugs: Secondary | ICD-10-CM | POA: Diagnosis not present

## 2017-11-11 DIAGNOSIS — Z79899 Other long term (current) drug therapy: Secondary | ICD-10-CM | POA: Insufficient documentation

## 2017-11-11 DIAGNOSIS — I10 Essential (primary) hypertension: Secondary | ICD-10-CM | POA: Diagnosis not present

## 2017-11-11 DIAGNOSIS — Z87891 Personal history of nicotine dependence: Secondary | ICD-10-CM | POA: Insufficient documentation

## 2017-11-11 DIAGNOSIS — Z91040 Latex allergy status: Secondary | ICD-10-CM | POA: Diagnosis not present

## 2017-11-11 DIAGNOSIS — J189 Pneumonia, unspecified organism: Secondary | ICD-10-CM | POA: Insufficient documentation

## 2017-11-11 DIAGNOSIS — E119 Type 2 diabetes mellitus without complications: Secondary | ICD-10-CM | POA: Diagnosis not present

## 2017-11-11 DIAGNOSIS — I5030 Unspecified diastolic (congestive) heart failure: Secondary | ICD-10-CM | POA: Diagnosis not present

## 2017-11-11 DIAGNOSIS — Z96653 Presence of artificial knee joint, bilateral: Secondary | ICD-10-CM | POA: Insufficient documentation

## 2017-11-11 DIAGNOSIS — I2510 Atherosclerotic heart disease of native coronary artery without angina pectoris: Secondary | ICD-10-CM | POA: Diagnosis not present

## 2017-11-11 DIAGNOSIS — K819 Cholecystitis, unspecified: Secondary | ICD-10-CM | POA: Diagnosis not present

## 2017-11-11 LAB — URINALYSIS, ROUTINE W REFLEX MICROSCOPIC
Bilirubin Urine: NEGATIVE
GLUCOSE, UA: NEGATIVE mg/dL
Hgb urine dipstick: NEGATIVE
KETONES UR: NEGATIVE mg/dL
LEUKOCYTES UA: NEGATIVE
NITRITE: NEGATIVE
PROTEIN: NEGATIVE mg/dL
Specific Gravity, Urine: 1.016 (ref 1.005–1.030)
pH: 6 (ref 5.0–8.0)

## 2017-11-11 LAB — CBC
HCT: 36.2 % (ref 36.0–46.0)
Hemoglobin: 10.7 g/dL — ABNORMAL LOW (ref 12.0–15.0)
MCH: 28.2 pg (ref 26.0–34.0)
MCHC: 29.6 g/dL — ABNORMAL LOW (ref 30.0–36.0)
MCV: 95.3 fL (ref 80.0–100.0)
NRBC: 0 % (ref 0.0–0.2)
PLATELETS: 198 10*3/uL (ref 150–400)
RBC: 3.8 MIL/uL — AB (ref 3.87–5.11)
RDW: 16.4 % — AB (ref 11.5–15.5)
WBC: 9.2 10*3/uL (ref 4.0–10.5)

## 2017-11-11 LAB — COMPREHENSIVE METABOLIC PANEL
ALK PHOS: 132 U/L — AB (ref 38–126)
ALT: 21 U/L (ref 0–44)
AST: 25 U/L (ref 15–41)
Albumin: 3.1 g/dL — ABNORMAL LOW (ref 3.5–5.0)
Anion gap: 8 (ref 5–15)
BILIRUBIN TOTAL: 0.5 mg/dL (ref 0.3–1.2)
BUN: 12 mg/dL (ref 6–20)
CALCIUM: 8.9 mg/dL (ref 8.9–10.3)
CO2: 24 mmol/L (ref 22–32)
CREATININE: 1.04 mg/dL — AB (ref 0.44–1.00)
Chloride: 108 mmol/L (ref 98–111)
GFR, EST NON AFRICAN AMERICAN: 58 mL/min — AB (ref >60)
Glucose, Bld: 107 mg/dL — ABNORMAL HIGH (ref 70–99)
Potassium: 3.5 mmol/L (ref 3.5–5.1)
Sodium: 140 mmol/L (ref 135–145)
TOTAL PROTEIN: 6.9 g/dL (ref 6.5–8.1)

## 2017-11-11 LAB — LIPASE, BLOOD: Lipase: 31 U/L (ref 11–51)

## 2017-11-11 MED ORDER — ACETAMINOPHEN 325 MG PO TABS: 650.0000 mg | ORAL_TABLET | Freq: Four times a day (QID) | ORAL | Status: DC | PRN

## 2017-11-11 MED ORDER — LEVOFLOXACIN 750 MG PO TABS: 750.0000 mg | ORAL_TABLET | Freq: Every day | ORAL | 0 refills | Status: DC

## 2017-11-11 MED ORDER — SODIUM CHLORIDE 0.9 % IJ SOLN
INTRAMUSCULAR | Status: AC
  Filled 2017-11-11: qty 50

## 2017-11-11 MED ORDER — LEVOFLOXACIN IN D5W 500 MG/100ML IV SOLN
500.0000 mg | Freq: Once | INTRAVENOUS | Status: AC
  Administered 2017-11-11: 500 mg via INTRAVENOUS
  Filled 2017-11-11: qty 100

## 2017-11-11 MED ORDER — IOPAMIDOL (ISOVUE-300) INJECTION 61%
100.0000 mL | Freq: Once | INTRAVENOUS | Status: AC | PRN
  Administered 2017-11-11: 100 mL via INTRAVENOUS

## 2017-11-11 MED ORDER — IOPAMIDOL (ISOVUE-300) INJECTION 61%
INTRAVENOUS | Status: AC
  Filled 2017-11-11: qty 100

## 2017-11-11 NOTE — ED Provider Notes (Signed)
[facility] DEPT Provider Note   CSN: [id number] Arrival date & time: 11/11/17  1408     History   Chief Complaint Chief Complaint  Patient presents with  . Abdominal Pain    HPI [name] is a 57 y.o. female.  The history is provided by the patient and medical records. No language interpreter was used.  Abdominal Pain       57 year old female with history of diabetes, fibromyalgia, GERD, recent cholecystostomy sent here from PCP office for evaluation of abdominal pain.  For the past 5 days patient has had recurrent pain to her abdomen.  Pain originated in the epigastric region, wraps around the left upper abdomen towards her left back.  Pain is intermittent, sharp, severe, sometimes worsening when she applied pressure or when she lays on the left side.  She endorsed some loose stools.  She endorsed occasional cough.  She denies having fever, lightheadedness, dizziness, chest pain, dysuria, or constipation. Patient had a recent hospital admission in September for transaminitis and abdominal pain and was diagnosed with cholecystitis with possible intrahepatic and pericholecystic abscess.  Patient was not a candidate for surgery due to history of sarcoidosis therefore a percutaneous drain was placed on 8/31.  She had her drained replaced yesterday.  She does endorse some pain to the right side of abdomen where she had a drain replaced but states that it has been minimal compared to the left side.  She denies any specific treatment tried at home as she is allergic to multiple medication.  She usually takes Tylenol but the last hospitalization, her liver enzyme was high therefore she avoids Tylenol.  She report her PCP is concerned that she may have pancreatitis and would like her to be evaluated in the ED. Pt does endorse recurrent non productive cough for the past few days, no SOB.     Past Medical History:  Diagnosis Date  . Allergic rhinitis     . ALLERGIC RHINITIS 10/26/2009  . Anxiety   . ANXIETY 08/14/2006  . B12 DEFICIENCY 08/25/2007  . Complication of anesthesia    pt has had difficulty following anesthesia with her knee in 2016-unable to care for herself afterward  . Confusion   . Depression    takes Lexapro daily  . Diabetes mellitus without complication ([place])    was on insulin but has been off since Nov 2015 and now only takes Metformin daily  . DYSPNEA 04/28/2009   with exertion  . Esophageal reflux    takes Nexium daily  . Fibromyalgia   . Headache    last migraine 2-8yrs ago;takes Topamax daily  . History of shingles   . Hypertension    takes Coreg daily  . Insomnia    takes Nortriptyline nightly   . Joint pain   . Joint swelling   . Left knee pain   . Lichen planus   . Long-term memory loss   . Nocturia   . OSA (obstructive sleep apnea)    doesn't use CPAP;sleep study in epic from 2006  . Osteoarthritis   . Osteoarthritis   . Pneumonia    over 30 yrs ago  . Protein calorie malnutrition ([place])   . Rheumatoid arthritis ([place])   . Sarcoidosis    Dr. Lolita Patella  . Short-term memory loss   . Shortness of breath   . TIA (transient ischemic attack)   . Unsteady gait   . Urinary urgency   . Vitamin D deficiency  is supposed to take Vit D but can't afford it  . VITAMIN D DEFICIENCY 08/25/2007    Patient Active Problem List   Diagnosis Date Noted  . URI (upper respiratory infection) 11/07/2017  . Dyslipidemia 10/28/2017  . Pruritus 10/23/2017  . Abnormal liver function test   . Abdominal pain 10/14/2017  . Anemia 09/24/2017  . Migraines 09/17/2017  . Cholecystitis, acute 09/12/2017  . Sepsis ([place]) 09/12/2017  . COPD (chronic obstructive pulmonary disease) ([place]) 12/30/2016  . Diastolic dysfunction 52/77/8242  . OSA (obstructive sleep apnea) 09/30/2016  . Pulmonary embolism ([place]) 07/21/2016  . DVT (deep venous thrombosis) ([place]) 07/21/2016  . Weight gain 12/20/2015  . Controlled type 2 diabetes  mellitus with chronic kidney disease, without long-term current use of insulin ([place]) 02/01/2015  . Abnormal SPEP 06/04/2013  . Memory loss 04/27/2013  . Panic attacks 03/17/2013  . Rash 10/27/2012  . Pulmonary nodules 07/20/2012  . CAD in native artery 05/28/2012  . Seborrheic dermatitis 01/20/2012  . Obesity, Class III, BMI 40-49.9 (morbid obesity) ([place]) 03/07/2010  . INSOMNIA, CHRONIC 03/07/2010  . Allergic rhinitis 10/26/2009  . Essential hypertension 08/27/2007  . B12 deficiency 08/25/2007  . Vitamin D deficiency 08/25/2007  . Osteoarthritis 02/02/2007  . Sarcoidosis 08/14/2006  . Anxiety state 08/14/2006  . Major depressive disorder, recurrent episode ([place]) 08/14/2006  . GERD 08/14/2006    Past Surgical History:  Procedure Laterality Date  . APPENDECTOMY    . arthroscopic knee surgery Right 11-12-04  . AXILLARY ABCESS IRRIGATION AND DEBRIDEMENT  Jul & Aug2012  . CARPAL TUNNEL RELEASE Left 05/23/2014   Procedure: CARPAL TUNNEL RELEASE;  Surgeon: Meredith Pel, MD;  Location: [facility];  Service: Orthopedics;  Laterality: Left;  . cyst removed from top of buttocks  at age 84  . ENDOMETRIAL ABLATION    . IR EXCHANGE BILIARY DRAIN  11/10/2017  . IR PERC CHOLECYSTOSTOMY  09/13/2017  . IR RADIOLOGIST EVAL & MGMT  10/08/2017  . LACRIMAL DUCT EXPLORATION Right 06/26/2017   Procedure: LACRIMAL DUCT EXPLORATION AND ETHMOIDECTOMY;  Surgeon: Clista Bernhardt, MD;  Location: [facility];  Service: Ophthalmology;  Laterality: Right;  . TEAR DUCT PROBING Right 06/26/2017   Procedure: TEAR DUCT PROBING WITH STENT;  Surgeon: Clista Bernhardt, MD;  Location: [facility];  Service: Ophthalmology;  Laterality: Right;  . TOTAL KNEE ARTHROPLASTY Right 11/15/2014   Procedure: TOTAL RIGHT KNEE ARTHROPLASTY;  Surgeon: Meredith Pel, MD;  Location: [facility];  Service: Orthopedics;  Laterality: Right;  . TOTAL KNEE ARTHROPLASTY Left 07/13/2015   Procedure: LEFT TOTAL KNEE ARTHROPLASTY;  Surgeon: Meredith Pel, MD;  Location: [facility];  Service: Orthopedics;  Laterality: Left;     OB History   None      Home Medications    Prior to Admission medications   Medication Sig Start Date End Date Taking? Authorizing Provider  acetaminophen (TYLENOL) 325 MG tablet Take 2 tablets (650 mg total) by mouth every 6 (six) hours as needed for mild pain, fever or headache. 09/19/17   Debbe Odea, MD  Adalimumab (HUMIRA) 40 MG/0.8ML PSKT Inject 40 mg into the skin every 7 (seven) days.     [provider]  augmented betamethasone dipropionate (DIPROLENE-AF) 0.05 % ointment Apply topically 2 (two) times daily. 09/30/17   [provider]  azithromycin (ZITHROMAX) 250 MG tablet Zpack taper as directed 11/07/17   Martyn Ehrich, NP  B-D ULTRA-FINE 33 LANCETS MISC Use as instructed to test sugars 4 times daily  DX: E11.8 09/22/17   Philemon Kingdom, MD  budesonide-formoterol [facility]) 160-4.5 MCG/ACT inhaler Inhale 2 puffs into the lungs TWICE DAILY 120/4=30 10/24/17   Rigoberto Noel, MD  carvedilol (COREG) 12.5 MG tablet Take 1 tablet (12.5 mg total) by mouth 2 (two) times daily with a meal. 06/12/17   Plotnikov, Evie Lacks, MD  clonazePAM (KLONOPIN) 0.5 MG tablet Take 1-2 tablets (0.5-1 mg total) by mouth at bedtime. 11/02/17   Plotnikov, Evie Lacks, MD  esomeprazole (NEXIUM) 40 MG capsule Take 1 capsule (40 mg total) by mouth daily before breakfast. 08/07/17   Plotnikov, Evie Lacks, MD  fluticasone (FLONASE) 50 MCG/ACT nasal spray Place 1 spray into both nostrils daily. 11/07/17   Martyn Ehrich, NP  furosemide (LASIX) 40 MG tablet TAKE 1 TABLET BY MOUTH EVERY MORNING FOR EDEMA Patient taking differently: Take 20 mg by mouth daily as needed for fluid or edema.  01/10/17   Plotnikov, Evie Lacks, MD  gabapentin (NEURONTIN) 300 MG capsule Take 1 capsule (300 mg total) by mouth 2 (two) times daily as needed (for pain). 06/12/17   Plotnikov, Evie Lacks, MD  glucose blood (EASY TALK BLOOD GLUCOSE TEST)  test strip Use as instructed 2x a day 10/28/17   Philemon Kingdom, MD  hydrOXYzine (ATARAX/VISTARIL) 25 MG tablet Take 1 tablet (25 mg total) by mouth every 8 (eight) hours as needed for itching. 10/23/17   Plotnikov, Evie Lacks, MD  metFORMIN (GLUCOPHAGE-XR) 500 MG 24 hr tablet TAKE 2 TABLETS(1000 MG) BY MOUTH DAILY WITH BREAKFAST 10/19/17   Mariel Aloe, MD  topiramate (TOPAMAX) 50 MG tablet Take 1 tablet (50 mg total) by mouth 2 (two) times daily. 06/12/17   Plotnikov, Evie Lacks, MD  triamcinolone cream (KENALOG) 0.1 % Apply 1 application topically 2 (two) times daily. 10/23/17   Plotnikov, Evie Lacks, MD  venlafaxine XR (EFFEXOR-XR) 75 MG 24 hr capsule Take 1 capsule (75 mg total) by mouth daily with breakfast. 10/23/17   Plotnikov, Evie Lacks, MD  VENTOLIN HFA 108 (90 Base) MCG/ACT inhaler Inhale 2 puffs into the lungs every 6 (six) hours as needed for wheezing or shortness of breath. 200/8=25 Patient taking differently: Inhale 2 puffs into the lungs every 6 (six) hours as needed for wheezing or shortness of breath.  12/11/16   Plotnikov, Evie Lacks, MD  VOLTAREN 1 % GEL Apply 2 g topically 2 (two) times daily as needed (for pain). 100/4=25 Patient taking differently: Apply 4 g topically 4 (four) times daily as needed (pain).  04/29/17   Plotnikov, Evie Lacks, MD  XARELTO 20 MG TABS tablet Take 1 tablet (20 mg total) by mouth at bedtime. 06/12/17   Plotnikov, Evie Lacks, MD    Family History Family History  Problem Relation Age of Onset  . Heart disease Mother   . Kidney disease Mother   . Cancer Father        leukemia  . Hypertension Unknown   . Coronary artery disease Unknown        female 1st degree relative <60  . Heart failure Unknown        congestive  . Coronary artery disease Unknown        Female 1st degree relative <50  . Breast cancer Unknown        1st degree relative <50 S  . Breast cancer Sister     Social History Social History   Tobacco Use  . Smoking status: Former  Smoker    Packs/day: 0.50  Years: 10.00    Pack years: 5.00    Types: Cigarettes  . Smokeless tobacco: Never Used  . Tobacco comment: quit smoking in 2004  Substance Use Topics  . Alcohol use: No  . Drug use: No     Allergies   Belsomra [suvorexant]; Enalapril maleate; Latex; Nickel; Other; Iodine; Doxycycline; Hydrochlorothiazide; Hydroxychloroquine sulfate; Lyrica [pregabalin]; and Tape   Review of Systems Review of Systems  Gastrointestinal: Positive for abdominal pain.  All other systems reviewed and are negative.    Physical Exam Updated Vital Signs BP 138/82 (BP Location: Left Arm)   Pulse 85   Temp 98.5 F (36.9 C) (Oral)   Resp 15   Ht 5\' 7"  (1.702 m)   Wt 112.9 kg   SpO2 95%   BMI 39.00 kg/m   Physical Exam  Constitutional: She appears well-developed and well-nourished. No distress.  Obese female laying in bed in no acute discomfort.  HENT:  Head: Atraumatic.  Mouth/Throat: Oropharynx is clear and moist.  Eyes: Conjunctivae are normal.  Neck: Neck supple.  Cardiovascular: Normal rate and regular rhythm.  Pulmonary/Chest: Effort normal and breath sounds normal.  Abdominal: Soft. Normal appearance. There is generalized tenderness (Tenderness throughout her abdomen with gentle palpation.  Normal appearing Percutaneous drainage on the right abdomen.).  Neurological: She is alert.  Skin: No rash noted.  Psychiatric: She has a normal mood and affect.  Nursing note and vitals reviewed.    ED Treatments / Results  Labs (all labs ordered are listed, but only abnormal results are displayed) Labs Reviewed  COMPREHENSIVE METABOLIC PANEL - Abnormal; Notable for the following components:      Result Value   Glucose, Bld 107 (*)    Creatinine, Ser 1.04 (*)    Albumin 3.1 (*)    Alkaline Phosphatase 132 (*)    GFR calc non Af Amer 58 (*)    All other components within normal limits  CBC - Abnormal; Notable for the following components:   RBC 3.80 (*)     Hemoglobin 10.7 (*)    MCHC 29.6 (*)    RDW 16.4 (*)    All other components within normal limits  LIPASE, BLOOD  URINALYSIS, ROUTINE W REFLEX MICROSCOPIC    EKG None  Radiology Ir Exchange Biliary Drain  Result Date: 11/10/2017 INDICATION: Chronic cholecystitis, cholecystostomy exchange EXAM: Fluoroscopic cholecystostomy exchange MEDICATIONS: None ANESTHESIA/SEDATION: Moderate Sedation Time: None. The patient's level of consciousness and vital signs were monitored continuously by radiology nursing throughout the procedure under my direct supervision. FLUOROSCOPY TIME:  Fluoroscopy Time: 0 minutes 54 seconds (27 mGy). COMPLICATIONS: None immediate. PROCEDURE: Informed written consent was obtained from the patient after a thorough discussion of the procedural risks, benefits and alternatives. All questions were addressed. Maximal Sterile Barrier Technique was utilized including caps, mask, sterile gowns, sterile gloves, sterile drape, hand hygiene and skin antiseptic. A timeout was performed prior to the initiation of the procedure. Under sterile conditions and local anesthesia, the existing cholecystostomy was injected confirming position within the collapsed gallbladder. Of note the cystic duct and common bile duct are patent. Contrast drains into the duodenum. Catheter was cut and exchanged over a Amplatz guidewire. New drain catheter position confirmed with contrast injection. Images obtained for documentation. Catheter secured with Prolene suture and connected to external gravity drainage bag. Sterile dressing applied. No immediate complication. Patient tolerated the procedure well. IMPRESSION: Successful fluoroscopic exchange of the 10 French cholecystostomy. Electronically Signed   By: Jerilynn Mages.  Shick M.D.  On: 11/10/2017 14:18    Procedures Procedures (including critical care time)  Medications Ordered in ED Medications  iopamidol (ISOVUE-300) 61 % injection (has no administration in time  range)  sodium chloride 0.9 % injection (has no administration in time range)  iopamidol (ISOVUE-300) 61 % injection 100 mL (100 mLs Intravenous Contrast Given 11/11/17 1909)  levofloxacin (LEVAQUIN) IVPB 500 mg (500 mg Intravenous New Bag/Given 11/11/17 2103)     Initial Impression / Assessment and Plan / ED Course  I have reviewed the triage vital signs and the nursing notes.  Pertinent labs & imaging results that were available during my care of the patient were reviewed by me and considered in my medical decision making (see chart for details).     BP 138/79   Pulse 75   Temp 98.5 F (36.9 C) (Oral)   Resp 18   Ht 5\' 7"  (1.702 m)   Wt 112.9 kg   SpO2 100%   BMI 39.00 kg/m    Final Clinical Impressions(s) / ED Diagnoses   Final diagnoses:  Community acquired pneumonia, unspecified laterality    ED Discharge Orders         Ordered    acetaminophen (TYLENOL) 325 MG tablet  Every 6 hours PRN     11/11/17 2314    levofloxacin (LEVAQUIN) 750 MG tablet  Daily     11/11/17 2314         3:42 PM Patient with known history of cholecystostomy for the past few months due to inflammation of her gallbladder.  Given the location of her pain, patient would benefit from an abdominal pelvic CT scan for further evaluation of her condition.  4:41 PM Several attempts to obtain blood by nurse was unsuccessful, IV team was consulted.  10:05 PM Urine without signs of urinary tract infection, normal lipase, electrolyte panels are reassuring, normal WBC, hemoglobin is 10.7.  An abdominal and pelvic CT scan obtained shown status post percutaneous transhepatic cholecystostomy and the gallbladder appears collapsed without any significant inflammation or free fluid.  Normal appearance of the pancreas.  There are 2 progressive nodular density within the lower lobes which are favored to be inflammatory or infectious in etiology.  Since patient does have history of sarcoidosis, and she does  endorse pain to her upper abdomen/chest, I think it would be prudent to treat for potential lung infection with Levaquin.  She will need a repeat imaging of her chest to ensure stability or resolution.  I discussed this finding with patient. I have low suspicion for ACS or PE causing her discomfort.    Domenic Moras, PA-C 11/11/17 2321    Daleen Bo, MD 11/12/17 1011

## 2017-11-11 NOTE — Progress Notes (Signed)
[name] is a 57 y.o. female with the following history as recorded in EpicCare:  Patient Active Problem List   Diagnosis Date Noted  . URI (upper respiratory infection) 11/07/2017  . Dyslipidemia 10/28/2017  . Pruritus 10/23/2017  . Abnormal liver function test   . Abdominal pain 10/14/2017  . Anemia 09/24/2017  . Migraines 09/17/2017  . Cholecystitis, acute 09/12/2017  . Sepsis ([place]) 09/12/2017  . COPD (chronic obstructive pulmonary disease) ([place]) 12/30/2016  . Diastolic dysfunction 16/10/9602  . OSA (obstructive sleep apnea) 09/30/2016  . Pulmonary embolism ([place]) 07/21/2016  . DVT (deep venous thrombosis) ([place]) 07/21/2016  . Weight gain 12/20/2015  . Controlled type 2 diabetes mellitus with chronic kidney disease, without long-term current use of insulin ([place]) 02/01/2015  . Abnormal SPEP 06/04/2013  . Memory loss 04/27/2013  . Panic attacks 03/17/2013  . Rash 10/27/2012  . Pulmonary nodules 07/20/2012  . CAD in native artery 05/28/2012  . Seborrheic dermatitis 01/20/2012  . Obesity, Class III, BMI 40-49.9 (morbid obesity) ([place]) 03/07/2010  . INSOMNIA, CHRONIC 03/07/2010  . Allergic rhinitis 10/26/2009  . Essential hypertension 08/27/2007  . B12 deficiency 08/25/2007  . Vitamin D deficiency 08/25/2007  . Osteoarthritis 02/02/2007  . Sarcoidosis 08/14/2006  . Anxiety state 08/14/2006  . Major depressive disorder, recurrent episode ([place]) 08/14/2006  . GERD 08/14/2006    Current Outpatient Medications  Medication Sig Dispense Refill  . acetaminophen (TYLENOL) 325 MG tablet Take 2 tablets (650 mg total) by mouth every 6 (six) hours as needed for mild pain, fever or headache. (Patient not taking: Reported on 11/11/2017)    . augmented betamethasone dipropionate (DIPROLENE-AF) 0.05 % ointment Apply topically 2 (two) times daily.  8  . azithromycin (ZITHROMAX) 250 MG tablet Zpack taper as directed 6 tablet 0  . B-D ULTRA-FINE 33 LANCETS MISC Use as instructed to  test sugars 4 times daily DX: E11.8 400 each 1  . budesonide-formoterol (SYMBICORT) 160-4.5 MCG/ACT inhaler Inhale 2 puffs into the lungs TWICE DAILY 120/4=30 10.2 g 0  . carvedilol (COREG) 12.5 MG tablet Take 1 tablet (12.5 mg total) by mouth 2 (two) times daily with a meal. 180 tablet 3  . clonazePAM (KLONOPIN) 0.5 MG tablet Take 1-2 tablets (0.5-1 mg total) by mouth at bedtime. (Patient taking differently: Take 0.125 mg by mouth at bedtime. Pt takes 1/4 tablet=0.125 mg qhs) 60 tablet 3  . esomeprazole (NEXIUM) 40 MG capsule Take 1 capsule (40 mg total) by mouth daily before breakfast. 90 capsule 1  . fluticasone (FLONASE) 50 MCG/ACT nasal spray Place 1 spray into both nostrils daily. 16 g 2  . furosemide (LASIX) 40 MG tablet TAKE 1 TABLET BY MOUTH EVERY MORNING FOR EDEMA (Patient taking differently: Take 40 mg by mouth daily as needed for fluid or edema. ) 90 tablet 1  . gabapentin (NEURONTIN) 300 MG capsule Take 1 capsule (300 mg total) by mouth 2 (two) times daily as needed (for pain). 180 capsule 1  . glucose blood (EASY TALK BLOOD GLUCOSE TEST) test strip Use as instructed 2x a day 200 each 12  . hydrOXYzine (ATARAX/VISTARIL) 25 MG tablet Take 1 tablet (25 mg total) by mouth every 8 (eight) hours as needed for itching. (Patient taking differently: Take 25 mg by mouth 2 (two) times daily. ) 60 tablet 1  . metFORMIN (GLUCOPHAGE-XR) 500 MG 24 hr tablet TAKE 2 TABLETS(1000 MG) BY MOUTH DAILY WITH BREAKFAST    . topiramate (TOPAMAX) 50 MG tablet Take 1 tablet (50 mg total)  by mouth 2 (two) times daily. 180 tablet 1  . triamcinolone cream (KENALOG) 0.1 % Apply 1 application topically 2 (two) times daily. 80 g 2  . venlafaxine XR (EFFEXOR-XR) 75 MG 24 hr capsule Take 1 capsule (75 mg total) by mouth daily with breakfast. 30 capsule 11  . VENTOLIN HFA 108 (90 Base) MCG/ACT inhaler Inhale 2 puffs into the lungs every 6 (six) hours as needed for wheezing or shortness of breath. 200/8=25 (Patient taking  differently: Inhale 2 puffs into the lungs every 6 (six) hours as needed for wheezing or shortness of breath. ) 18 g 11  . VOLTAREN 1 % GEL Apply 2 g topically 2 (two) times daily as needed (for pain). 100/4=25 (Patient taking differently: Apply 4 g topically 4 (four) times daily as needed (pain). ) 100 g 2  . XARELTO 20 MG TABS tablet Take 1 tablet (20 mg total) by mouth at bedtime. 90 tablet 3  . Probiotic Product (PROBIOTIC-10 PO) Take 1 capsule by mouth daily.     No current facility-administered medications for this visit.     Allergies: Belsomra [suvorexant]; Enalapril maleate; Latex; Nickel; Other; Iodine; Doxycycline; Hydrochlorothiazide; Hydroxychloroquine sulfate; Lyrica [pregabalin]; and Tape  Past Medical History:  Diagnosis Date  . Allergic rhinitis   . ALLERGIC RHINITIS 10/26/2009  . Anxiety   . ANXIETY 08/14/2006  . B12 DEFICIENCY 08/25/2007  . Complication of anesthesia    pt has had difficulty following anesthesia with her knee in 2016-unable to care for herself afterward  . Confusion   . Depression    takes Lexapro daily  . Diabetes mellitus without complication ([place])    was on insulin but has been off since Nov 2015 and now only takes Metformin daily  . DYSPNEA 04/28/2009   with exertion  . Esophageal reflux    takes Nexium daily  . Fibromyalgia   . Headache    last migraine 2-67yr ago;takes Topamax daily  . History of shingles   . Hypertension    takes Coreg daily  . Insomnia    takes Nortriptyline nightly   . Joint pain   . Joint swelling   . Left knee pain   . Lichen planus   . Long-term memory loss   . Nocturia   . OSA (obstructive sleep apnea)    doesn't use CPAP;sleep study in epic from 2006  . Osteoarthritis   . Osteoarthritis   . Pneumonia    over 30 yrs ago  . Protein calorie malnutrition ([place]   . Rheumatoid arthritis ([place]   . Sarcoidosis    Dr. ZLolita Patella . Short-term memory loss   . Shortness of breath   . TIA (transient ischemic attack)    . Unsteady gait   . Urinary urgency   . Vitamin D deficiency    is supposed to take Vit D but can't afford it  . VITAMIN D DEFICIENCY 08/25/2007    Past Surgical History:  Procedure Laterality Date  . APPENDECTOMY    . arthroscopic knee surgery Right 11-12-04  . AXILLARY ABCESS IRRIGATION AND DEBRIDEMENT  Jul & Aug2012  . CARPAL TUNNEL RELEASE Left 05/23/2014   Procedure: CARPAL TUNNEL RELEASE;  Surgeon: SMeredith Pel MD;  Location: [facility]  Service: Orthopedics;  Laterality: Left;  . cyst removed from top of buttocks  at age 89 . ENDOMETRIAL ABLATION    . IR EXCHANGE BILIARY DRAIN  11/10/2017  . IR PERC CHOLECYSTOSTOMY  09/13/2017  . IR RADIOLOGIST EVAL &  MGMT  10/08/2017  . LACRIMAL DUCT EXPLORATION Right 06/26/2017   Procedure: LACRIMAL DUCT EXPLORATION AND ETHMOIDECTOMY;  Surgeon: Clista Bernhardt, MD;  Location: [facility];  Service: Ophthalmology;  Laterality: Right;  . TEAR DUCT PROBING Right 06/26/2017   Procedure: TEAR DUCT PROBING WITH STENT;  Surgeon: Clista Bernhardt, MD;  Location: [facility];  Service: Ophthalmology;  Laterality: Right;  . TOTAL KNEE ARTHROPLASTY Right 11/15/2014   Procedure: TOTAL RIGHT KNEE ARTHROPLASTY;  Surgeon: Meredith Pel, MD;  Location: [facility];  Service: Orthopedics;  Laterality: Right;  . TOTAL KNEE ARTHROPLASTY Left 07/13/2015   Procedure: LEFT TOTAL KNEE ARTHROPLASTY;  Surgeon: Meredith Pel, MD;  Location: [facility];  Service: Orthopedics;  Laterality: Left;    Family History  Problem Relation Age of Onset  . Heart disease Mother   . Kidney disease Mother   . Cancer Father        leukemia  . Hypertension Unknown   . Coronary artery disease Unknown        female 1st degree relative <60  . Heart failure Unknown        congestive  . Coronary artery disease Unknown        Female 1st degree relative <50  . Breast cancer Unknown        1st degree relative <50 S  . Breast cancer Sister     Social History   Tobacco Use  . Smoking status:  Former Smoker    Packs/day: 0.50    Years: 10.00    Pack years: 5.00    Types: Cigarettes  . Smokeless tobacco: Never Used  . Tobacco comment: quit smoking in 2004  Substance Use Topics  . Alcohol use: No    Subjective:  Patient presents with "severe" left sided abdominal/ flank/back pain which started Friday evening; feels that pain is localized at her left breast bone/ left hip; saw IR yesterday and had gallbladder drain changed yesterday- was told that her symptoms are not related to the gallbladder; having to sleep on her left side because of the gallbladder drain; patient notes the pain is severe in office today;  Has already spoken to triage nurse about her symptoms- was told that she needed to go to the ER; is adamant that she does not want to go "sit at the ER as bad as I am feeling."      Objective:  Vitals:   11/11/17 1308  BP: (!) 148/88  Pulse: 76  Temp: 98.3 F (36.8 C)  TempSrc: Oral  SpO2: 96%  Weight: 252 lb (114.3 kg)  Height: 5' 7"  (1.702 m)    General: Well developed, well nourished, in no acute distress; tearful in office Skin : Warm and dry.  Head: Normocephalic and atraumatic  Eyes: Sclera and conjunctiva clear; pupils round and reactive to light; extraocular movements intact  Ears: External normal; canals clear; tympanic membranes normal  Oropharynx: Pink, supple. No suspicious lesions  Neck: Supple without thyromegaly, adenopathy  Lungs: Respirations unlabored; clear to auscultation bilaterally without wheeze, rales, rhonchi  CVS exam: normal rate and regular rhythm.  Abdomen: Limited physical exam due to body habitus; tender over left side of abdomen;  nondistended; normoactive bowel sounds; no masses or hepatosplenomegaly  Musculoskeletal: No deformities; no active joint inflammation  Extremities: No edema, cyanosis, clubbing  Vessels: Symmetric bilaterally  Neurologic: Alert and oriented; speech intact; face symmetrical; moves all extremities well;  CNII-XII intact without focal deficit   Assessment:  1. Abdominal pain,  unspecified abdominal location     Plan:  Patient is extremely upset and tearful in office/ notes she is in severe pain; does have known gallbladder disease- need to consider pancreatitis due to amount of pain she appears to be having; other issues would be pneumonia or muscular source in her back; Due to transportation issues, patient notes she does not think she can do outpatient testing tomorrow/ I cannot get any imaging done today; feel best that she go to ER for immediate evaluation- she is in agreement.   No follow-ups on file.  Orders Placed This Encounter  Procedures  . CBC w/Diff    Standing Status:   Future    Standing Expiration Date:   11/11/2018  . Comp Met (CMET)    Standing Status:   Future    Standing Expiration Date:   11/11/2018  . Amylase    Standing Status:   Future    Standing Expiration Date:   11/11/2018  . Lipase    Standing Status:   Future    Standing Expiration Date:   11/11/2018    Requested Prescriptions    No prescriptions requested or ordered in this encounter

## 2017-11-11 NOTE — Discharge Instructions (Signed)
You have been evaluated for your upper abdominal discomfort.  Although your abdominal and pelvis CT scan did not show any concerning changes in your abdominal cavity, there are several reactive nodules in your lower lung that are concerning for potential infection.  Take antibiotic as prescribed, take tylenol for pain.  Please have a repeat chest xray in 1 week to ensure resolution of your condition.  Follow up with your doctor for further care.  Return if you have any concerns.

## 2017-11-11 NOTE — ED Triage Notes (Signed)
Patient here from [facility] with complaints of left side abd pain radiating into back. States that she had her gallbladder drainage tube changed yesterday. States that he was sent here because "they are concerned about my pancreas".

## 2017-11-14 DIAGNOSIS — Z434 Encounter for attention to other artificial openings of digestive tract: Secondary | ICD-10-CM | POA: Diagnosis not present

## 2017-11-14 DIAGNOSIS — I130 Hypertensive heart and chronic kidney disease with heart failure and stage 1 through stage 4 chronic kidney disease, or unspecified chronic kidney disease: Secondary | ICD-10-CM | POA: Diagnosis not present

## 2017-11-14 DIAGNOSIS — K819 Cholecystitis, unspecified: Secondary | ICD-10-CM | POA: Diagnosis not present

## 2017-11-14 DIAGNOSIS — J449 Chronic obstructive pulmonary disease, unspecified: Secondary | ICD-10-CM | POA: Diagnosis not present

## 2017-11-14 DIAGNOSIS — I5030 Unspecified diastolic (congestive) heart failure: Secondary | ICD-10-CM | POA: Diagnosis not present

## 2017-11-14 DIAGNOSIS — J9611 Chronic respiratory failure with hypoxia: Secondary | ICD-10-CM | POA: Diagnosis not present

## 2017-11-17 ENCOUNTER — Encounter: Admit: 2017-11-17 | Discharge: 2017-11-18 | Payer: MEDICARE

## 2017-11-17 DIAGNOSIS — Z79899 Other long term (current) drug therapy: Secondary | ICD-10-CM | POA: Diagnosis not present

## 2017-11-17 DIAGNOSIS — L439 Lichen planus, unspecified: Secondary | ICD-10-CM | POA: Diagnosis not present

## 2017-11-17 DIAGNOSIS — D869 Sarcoidosis, unspecified: Secondary | ICD-10-CM | POA: Diagnosis not present

## 2017-11-17 MED ORDER — CLOBETASOL 0.05 % TOPICAL OINTMENT
Freq: Two times a day (BID) | TOPICAL | 5 refills | 0.00000 days | Status: CP
Start: 2017-11-17 — End: 2018-04-06

## 2017-11-18 DIAGNOSIS — I5030 Unspecified diastolic (congestive) heart failure: Secondary | ICD-10-CM | POA: Diagnosis not present

## 2017-11-18 DIAGNOSIS — I130 Hypertensive heart and chronic kidney disease with heart failure and stage 1 through stage 4 chronic kidney disease, or unspecified chronic kidney disease: Secondary | ICD-10-CM | POA: Diagnosis not present

## 2017-11-18 DIAGNOSIS — K819 Cholecystitis, unspecified: Secondary | ICD-10-CM | POA: Diagnosis not present

## 2017-11-18 DIAGNOSIS — J449 Chronic obstructive pulmonary disease, unspecified: Secondary | ICD-10-CM | POA: Diagnosis not present

## 2017-11-18 DIAGNOSIS — J9611 Chronic respiratory failure with hypoxia: Secondary | ICD-10-CM | POA: Diagnosis not present

## 2017-11-18 DIAGNOSIS — Z434 Encounter for attention to other artificial openings of digestive tract: Secondary | ICD-10-CM | POA: Diagnosis not present

## 2017-11-19 ENCOUNTER — Telehealth: Payer: Self-pay | Admitting: Internal Medicine

## 2017-11-19 DIAGNOSIS — D519 Vitamin B12 deficiency anemia, unspecified: Secondary | ICD-10-CM | POA: Diagnosis not present

## 2017-11-19 DIAGNOSIS — I2510 Atherosclerotic heart disease of native coronary artery without angina pectoris: Secondary | ICD-10-CM | POA: Diagnosis not present

## 2017-11-19 DIAGNOSIS — G4733 Obstructive sleep apnea (adult) (pediatric): Secondary | ICD-10-CM | POA: Diagnosis not present

## 2017-11-19 DIAGNOSIS — K219 Gastro-esophageal reflux disease without esophagitis: Secondary | ICD-10-CM | POA: Diagnosis not present

## 2017-11-19 DIAGNOSIS — Z9981 Dependence on supplemental oxygen: Secondary | ICD-10-CM | POA: Diagnosis not present

## 2017-11-19 DIAGNOSIS — I130 Hypertensive heart and chronic kidney disease with heart failure and stage 1 through stage 4 chronic kidney disease, or unspecified chronic kidney disease: Secondary | ICD-10-CM | POA: Diagnosis not present

## 2017-11-19 DIAGNOSIS — F329 Major depressive disorder, single episode, unspecified: Secondary | ICD-10-CM | POA: Diagnosis not present

## 2017-11-19 DIAGNOSIS — Z96653 Presence of artificial knee joint, bilateral: Secondary | ICD-10-CM | POA: Diagnosis not present

## 2017-11-19 DIAGNOSIS — M1990 Unspecified osteoarthritis, unspecified site: Secondary | ICD-10-CM | POA: Diagnosis not present

## 2017-11-19 DIAGNOSIS — E441 Mild protein-calorie malnutrition: Secondary | ICD-10-CM | POA: Diagnosis not present

## 2017-11-19 DIAGNOSIS — J9611 Chronic respiratory failure with hypoxia: Secondary | ICD-10-CM | POA: Diagnosis not present

## 2017-11-19 DIAGNOSIS — M069 Rheumatoid arthritis, unspecified: Secondary | ICD-10-CM | POA: Diagnosis not present

## 2017-11-19 DIAGNOSIS — Z7984 Long term (current) use of oral hypoglycemic drugs: Secondary | ICD-10-CM | POA: Diagnosis not present

## 2017-11-19 DIAGNOSIS — Z434 Encounter for attention to other artificial openings of digestive tract: Secondary | ICD-10-CM | POA: Diagnosis not present

## 2017-11-19 DIAGNOSIS — Z6841 Body Mass Index (BMI) 40.0 and over, adult: Secondary | ICD-10-CM | POA: Diagnosis not present

## 2017-11-19 DIAGNOSIS — I5030 Unspecified diastolic (congestive) heart failure: Secondary | ICD-10-CM | POA: Diagnosis not present

## 2017-11-19 DIAGNOSIS — F419 Anxiety disorder, unspecified: Secondary | ICD-10-CM | POA: Diagnosis not present

## 2017-11-19 DIAGNOSIS — M797 Fibromyalgia: Secondary | ICD-10-CM | POA: Diagnosis not present

## 2017-11-19 DIAGNOSIS — Z7901 Long term (current) use of anticoagulants: Secondary | ICD-10-CM | POA: Diagnosis not present

## 2017-11-19 DIAGNOSIS — K819 Cholecystitis, unspecified: Secondary | ICD-10-CM | POA: Diagnosis not present

## 2017-11-19 DIAGNOSIS — D869 Sarcoidosis, unspecified: Secondary | ICD-10-CM | POA: Diagnosis not present

## 2017-11-19 DIAGNOSIS — E1122 Type 2 diabetes mellitus with diabetic chronic kidney disease: Secondary | ICD-10-CM | POA: Diagnosis not present

## 2017-11-19 DIAGNOSIS — J449 Chronic obstructive pulmonary disease, unspecified: Secondary | ICD-10-CM | POA: Diagnosis not present

## 2017-11-19 DIAGNOSIS — N182 Chronic kidney disease, stage 2 (mild): Secondary | ICD-10-CM | POA: Diagnosis not present

## 2017-11-19 NOTE — Telephone Encounter (Signed)
Copied from [place] [phone]. Topic: General - Other >> Nov 19, 2017  4:00 PM Keene Breath wrote: Reason for CRM: Lattie Haw with [facility] called to request verbal orders for patient - 1x wk for 5 wks, Change dressing, medication management, disease process education.  Please advise.  CB# [phone]

## 2017-11-21 ENCOUNTER — Other Ambulatory Visit (HOSPITAL_COMMUNITY): Payer: Self-pay | Admitting: Radiology

## 2017-11-21 ENCOUNTER — Other Ambulatory Visit (HOSPITAL_COMMUNITY): Payer: Self-pay | Admitting: Interventional Radiology

## 2017-11-21 ENCOUNTER — Ambulatory Visit (HOSPITAL_COMMUNITY)
Admission: RE | Admit: 2017-11-21 | Discharge: 2017-11-21 | Disposition: A | Discharge status: Home / Self Care | Payer: Medicare Other | Source: Ambulatory Visit | Attending: Radiology | Admitting: Radiology

## 2017-11-21 ENCOUNTER — Encounter (HOSPITAL_COMMUNITY): Payer: Self-pay | Admitting: Interventional Radiology

## 2017-11-21 DIAGNOSIS — K810 Acute cholecystitis: Secondary | ICD-10-CM | POA: Diagnosis not present

## 2017-11-21 DIAGNOSIS — K819 Cholecystitis, unspecified: Secondary | ICD-10-CM | POA: Diagnosis not present

## 2017-11-21 DIAGNOSIS — Z4803 Encounter for change or removal of drains: Secondary | ICD-10-CM | POA: Insufficient documentation

## 2017-11-21 HISTORY — PX: IR CHOLANGIOGRAM EXISTING TUBE: IMG6040

## 2017-11-21 MED ORDER — IOPAMIDOL (ISOVUE-300) INJECTION 61%
50.0000 mL | Freq: Once | INTRAVENOUS | Status: AC | PRN
  Administered 2017-11-21: 10 mL

## 2017-11-21 MED ORDER — IOPAMIDOL (ISOVUE-300) INJECTION 61%
INTRAVENOUS | Status: AC
  Administered 2017-11-21: 10 mL
  Filled 2017-11-21: qty 50

## 2017-11-21 MED ORDER — LIDOCAINE HCL 1 % IJ SOLN
INTRAMUSCULAR | Status: AC
  Filled 2017-11-21: qty 20

## 2017-11-21 NOTE — Procedures (Signed)
Interventional Radiology Procedure Note  Patient is 57 yo female with acute cholecystitis, late August.  Dr. Leighton Ruff with [facility] was consulting physician.  VIR placed perc chole 09/13/2017.    Today she has complaint that she has pain at the insertion site/stitch site.   Procedure: Injection.  Patent cystic duct.  Re-stitch after local.    Complications: None  Recommendations:  - Follow up with Dr. Marcello Moores.  - If not a candidate for surgery ever, we can discuss removal, as the ductal system is patent today.  - Routine care   Signed,  Dulcy Fanny. Earleen Newport, DO

## 2017-11-21 NOTE — Telephone Encounter (Signed)
LM giving verbals, FYI 

## 2017-11-24 DIAGNOSIS — K819 Cholecystitis, unspecified: Secondary | ICD-10-CM | POA: Diagnosis not present

## 2017-11-24 DIAGNOSIS — I130 Hypertensive heart and chronic kidney disease with heart failure and stage 1 through stage 4 chronic kidney disease, or unspecified chronic kidney disease: Secondary | ICD-10-CM | POA: Diagnosis not present

## 2017-11-24 DIAGNOSIS — Z434 Encounter for attention to other artificial openings of digestive tract: Secondary | ICD-10-CM | POA: Diagnosis not present

## 2017-11-24 DIAGNOSIS — J9611 Chronic respiratory failure with hypoxia: Secondary | ICD-10-CM | POA: Diagnosis not present

## 2017-11-24 DIAGNOSIS — J449 Chronic obstructive pulmonary disease, unspecified: Secondary | ICD-10-CM | POA: Diagnosis not present

## 2017-11-24 DIAGNOSIS — I5030 Unspecified diastolic (congestive) heart failure: Secondary | ICD-10-CM | POA: Diagnosis not present

## 2017-11-28 ENCOUNTER — Other Ambulatory Visit: Payer: Self-pay | Admitting: *Deleted

## 2017-11-28 NOTE — Patient Outreach (Signed)
[place] [facility]) Care Management  11/28/2017  [place] 17-Jun-1960 [id number]   Call placed to member to follow up on current health status.  She report she was recently diagnosed with pneumonia, was placed on antibiotics which she finished last week.  Still report some congestion.  State she still has not been able to obtain new strips for her meter, has not requested one from her endocrinologist because she is unsure which meter Medicaid will pay for.  She agrees to home visit next week to help with this issue and assessment of further needs.  Denies any urgent concerns.  Valente David, [place], MSN [place] [phone]

## 2017-12-02 DIAGNOSIS — D492 Neoplasm of unspecified behavior of bone, soft tissue, and skin: Secondary | ICD-10-CM | POA: Diagnosis not present

## 2017-12-03 ENCOUNTER — Other Ambulatory Visit: Payer: Self-pay | Admitting: *Deleted

## 2017-12-03 DIAGNOSIS — I130 Hypertensive heart and chronic kidney disease with heart failure and stage 1 through stage 4 chronic kidney disease, or unspecified chronic kidney disease: Secondary | ICD-10-CM | POA: Diagnosis not present

## 2017-12-03 DIAGNOSIS — Z434 Encounter for attention to other artificial openings of digestive tract: Secondary | ICD-10-CM | POA: Diagnosis not present

## 2017-12-03 DIAGNOSIS — J9611 Chronic respiratory failure with hypoxia: Secondary | ICD-10-CM | POA: Diagnosis not present

## 2017-12-03 DIAGNOSIS — K819 Cholecystitis, unspecified: Secondary | ICD-10-CM | POA: Diagnosis not present

## 2017-12-03 DIAGNOSIS — J449 Chronic obstructive pulmonary disease, unspecified: Secondary | ICD-10-CM | POA: Diagnosis not present

## 2017-12-03 DIAGNOSIS — I5030 Unspecified diastolic (congestive) heart failure: Secondary | ICD-10-CM | POA: Diagnosis not present

## 2017-12-03 NOTE — Patient Outreach (Signed)
[place] [facility]) Care Management   12/03/2017  [name] May 08, 1960 [id number]  [name] is an 57 y.o. female  Subjective:   Member alert and oriented x3, denies pain or discomfort.  Does complain of rash/itching on right leg related to Lichen Planus.  Report taking medications as prescribed, but having trouble getting cream for Lichen Planus from pharmacy.  Has appointment with primary MD on 11/25.  Objective:   Review of Systems  Constitutional: Negative.   HENT: Positive for congestion and sore throat.   Eyes: Negative.   Respiratory: Positive for cough.   Cardiovascular: Negative.   Gastrointestinal: Negative.   Genitourinary: Negative.   Musculoskeletal: Positive for back pain.  Skin: Positive for rash.       Lichens  Neurological: Negative.   Endo/Heme/Allergies: Negative.   Psychiatric/Behavioral: Negative.     Physical Exam  Constitutional: She is oriented to person, place, and time. She appears well-developed and well-nourished.  Neck: Normal range of motion.  Cardiovascular: Normal rate, regular rhythm and normal heart sounds.  Respiratory: Effort normal and breath sounds normal.  GI: Soft. Bowel sounds are normal.  Musculoskeletal: Normal range of motion.  Neurological: She is alert and oriented to person, place, and time.  Skin: Skin is warm and dry.   BP 122/68 (BP Location: Left Arm, Patient Position: Sitting, Cuff Size: Normal)   Pulse 78   Resp 18   SpO2 97%   Encounter Medications:   Outpatient Encounter Medications as of 12/03/2017  Medication Sig Note  . Adalimumab (HUMIRA PEN-PS/UV/ADOL HS START) 40 MG/0.8ML PNKT Inject 40 mg into the skin once a week.   . B-D ULTRA-FINE 33 LANCETS MISC Use as instructed to test sugars 4 times daily DX: E11.8   . budesonide-formoterol (SYMBICORT) 160-4.5 MCG/ACT inhaler Inhale 2 puffs into the lungs TWICE DAILY 120/4=30   . carvedilol (COREG) 12.5 MG tablet Take 1 tablet  (12.5 mg total) by mouth 2 (two) times daily with a meal.   . clonazePAM (KLONOPIN) 0.5 MG tablet Take 1-2 tablets (0.5-1 mg total) by mouth at bedtime. (Patient taking differently: Take 0.125 mg by mouth at bedtime. Pt takes 1/4 tablet=0.125 mg qhs)   . esomeprazole (NEXIUM) 40 MG capsule Take 1 capsule (40 mg total) by mouth daily before breakfast.   . furosemide (LASIX) 40 MG tablet TAKE 1 TABLET BY MOUTH EVERY MORNING FOR EDEMA (Patient taking differently: Take 40 mg by mouth daily as needed for fluid or edema. )   . gabapentin (NEURONTIN) 300 MG capsule Take 1 capsule (300 mg total) by mouth 2 (two) times daily as needed (for pain).   . hydrOXYzine (ATARAX/VISTARIL) 25 MG tablet Take 1 tablet (25 mg total) by mouth every 8 (eight) hours as needed for itching. (Patient taking differently: Take 25 mg by mouth 2 (two) times daily. )   . metFORMIN (GLUCOPHAGE-XR) 500 MG 24 hr tablet TAKE 2 TABLETS(1000 MG) BY MOUTH DAILY WITH BREAKFAST   . Probiotic Product (PROBIOTIC-10 PO) Take 1 capsule by mouth daily.   [name] topiramate (TOPAMAX) 50 MG tablet Take 1 tablet (50 mg total) by mouth 2 (two) times daily.   [name] triamcinolone cream (KENALOG) 0.1 % Apply 1 application topically 2 (two) times daily.   [name] venlafaxine XR (EFFEXOR-XR) 75 MG 24 hr capsule Take 1 capsule (75 mg total) by mouth daily with breakfast.   . VENTOLIN HFA 108 (90 Base) MCG/ACT inhaler Inhale 2 puffs into the lungs every 6 (six) hours as  needed for wheezing or shortness of breath. 200/8=25 (Patient taking differently: Inhale 2 puffs into the lungs every 6 (six) hours as needed for wheezing or shortness of breath. )   . VOLTAREN 1 % GEL Apply 2 g topically 2 (two) times daily as needed (for pain). 100/4=25 (Patient taking differently: Apply 4 g topically 4 (four) times daily as needed (pain). )   . XARELTO 20 MG TABS tablet Take 1 tablet (20 mg total) by mouth at bedtime.   [name] acetaminophen (TYLENOL) 325 MG tablet Take 2 tablets (650 mg total)  by mouth every 6 (six) hours as needed for mild pain, fever or headache. (Patient not taking: Reported on 12/03/2017)   . augmented betamethasone dipropionate (DIPROLENE-AF) 0.05 % ointment Apply topically 2 (two) times daily.   [name] azithromycin (ZITHROMAX) 250 MG tablet Zpack taper as directed (Patient not taking: Reported on 12/03/2017) 11/11/2017: Hasn't started.  . fluticasone (FLONASE) 50 MCG/ACT nasal spray Place 1 spray into both nostrils daily. (Patient not taking: Reported on 12/03/2017) 11/11/2017: Hasn't started.  [name] glucose blood (EASY TALK BLOOD GLUCOSE TEST) test strip Use as instructed 2x a day (Patient not taking: Reported on 12/03/2017) 12/03/2017: Need new strips/meter  . levofloxacin (LEVAQUIN) 750 MG tablet Take 1 tablet (750 mg total) by mouth daily. X 7 days (Patient not taking: Reported on 12/03/2017) 12/03/2017: Dose complete    No facility-administered encounter medications on file as of 12/03/2017.     Functional Status:   In your present state of health, do you have any difficulty performing the following activities: 11/07/2017 10/14/2017  Hearing? N N  Vision? N N  Comment - -  Difficulty concentrating or making decisions? N N  Walking or climbing stairs? Y Y  Dressing or bathing? N N  Doing errands, shopping? [name]  Preparing Food and eating ? N -  Using the Toilet? N -  In the past six months, have you accidently leaked urine? N -  Do you have problems with loss of bowel control? N -  Managing your Medications? N -  Managing your Finances? N -  Housekeeping or managing your Housekeeping? Y -  Some recent data might be hidden    Fall/Depression Screening:    Fall Risk  11/07/2017 10/06/2017 10/02/2017  Falls in the past year? No No No  Number falls in past yr: - - -  Injury with Fall? - - -  Risk Factor Category  - - -  Risk for fall due to : Impaired balance/gait;Impaired mobility - -  Follow up - - -   PHQ 2/9 Scores 11/07/2017 10/06/2017 10/02/2017 02/17/2017  01/15/2017 12/19/2016 11/06/2016  PHQ - 2 Score 3 0 0 2 1 5 2   PHQ- 9 Score 8 - - 7 - 9 7    Assessment:    Met with member at scheduled time.  She report she is doing "pretty good."  She still has perc drain/bag, home health nurse will visit today for dressing change.  Denies any fever, redness or pain at that site.  Main frustration is issues with her pharmacy.  She was seen in [facility] for her Lichen Planus, cream called into the pharmacy.  She has called multiple times to follow up but still have not received it yet.  She is also having problems obtaining a new glucose meter.    Call placed to pharmacy, tech state she is able to see the order for the cream but someone will call this care manager back  to discuss.  Unable to inquire about glucose meter at this time.  Member report she is concerned about meals, requesting to be considered for mobile meals.  Agrees to have BSW contact her.  Denies any urgent concerns at this time, advised to contact this care manager with questions.  Plan:   Will contact BSW regarding request for mobile meals. Will contact [facility] pharmacist regarding concerns about medications and glucose meter. Will follow up with member within the next month.  Valente David, [place], MSN [place] [phone]

## 2017-12-04 ENCOUNTER — Other Ambulatory Visit: Payer: Self-pay | Admitting: Pharmacist

## 2017-12-04 ENCOUNTER — Encounter: Payer: Self-pay | Admitting: *Deleted

## 2017-12-04 ENCOUNTER — Telehealth: Payer: Self-pay

## 2017-12-04 NOTE — Telephone Encounter (Signed)
Is it [facility]? Thx

## 2017-12-04 NOTE — Patient Outreach (Signed)
[facility]) Care Management  [place]   12/04/2017  Demari Kropp [facility] 01-01-1961 [id number]   Reason for referral:  topical medication ordered but patient having difficulty obtaining from pharmacy, patient needs new diabetic testing supplies, would like to know most affordable option  Referral source: [facility] RN Current insurance: Medicaid / Medicare A/B / [employer] Part D  PMHx: Sarcoidosis, on Humira, morbid obesity, T2DM, depression, B-12 deficiency, vitamin D deficiency, depression, insomnia, OSA and GERD, rece  Outreach:   2:09PM Unsuccessful initial call to Ms. Edwards. HIPAA compliant voicemail left.   2:14PM Care coordination to [facility].  Pharmacist unable to verify patient's insurance plan.  They provided a phone number but state they are not sure what plan they are billing.  Pharmacist states clobetasol not covered by insurance but he has not contacted provider yet to change although it has been > 2 weeks.  He states a topical steroid was delivered to patient's home today however this is an eye drop not a skin ointment.  He states he will call provider regarding substitution.   2:39PM Care coordination call to [facility]  (Dr. Edison Simon).   Message left on voicemail requesting call back about clobetasol and need for substitution.    3:02PM Incoming call received from Ms. Ventura.  HIPAA identifiers verified. Patient states she needs to have medications delivered which is why she is currently using [employer].     I updated patient regarding Clobetasol not being covered by her insurance and that I am waiting to hear back from [facility] regarding substitution.  Patient reports she is using triamcinolone cream currently but does not remember the strength.  She states this helps with her Lichen Planus but does not completely clear it up. She will continue to use this until another more potent steroid  substitution called in for her.    Patient would like to start checking blood sugar again but needs new diabetic testing supplies.  [place] cannot bill Medicare Part B for diabetic testing supplies therefore she will need to obtain these from larger chain pharmacy [facility], [facility], [facility]) or try to use specific meter covered by Part D [employer]. Patient is not at home and does not know what meter she is currently using.    3:54PM Care coordination to [employer].  No diabetic testing supplies covered through Medicare Part D plan.  Patient's only option is use have medications filled at another pharmacy that can bill for Medicare Part B.     Plan: -Will await call back from [facility] regarding Clobetasol substitution    Ralene Bathe, PharmD, [place] [phone]

## 2017-12-04 NOTE — Telephone Encounter (Signed)
Please advise about referral  Copied from [place] [id number]. Topic: General - Other >> Dec 04, 2017 10:00 AM Keene Breath wrote: Reason for CRM: Brayton Layman with [facility] called on behalf of patient, requesting that the patient get a referral to [facility] for her diagnosis of Sarcoidosis.  Patient wants the referral to [facility] because she feels that the care would be better for her condition.  Please advise and call back if you have any questions, CB# [phone].

## 2017-12-05 ENCOUNTER — Other Ambulatory Visit: Payer: Self-pay | Admitting: Internal Medicine

## 2017-12-05 NOTE — Telephone Encounter (Signed)
Yes, I believe so.

## 2017-12-08 ENCOUNTER — Ambulatory Visit: Payer: Self-pay | Admitting: Pharmacist

## 2017-12-08 ENCOUNTER — Encounter: Payer: Self-pay | Admitting: Internal Medicine

## 2017-12-08 ENCOUNTER — Ambulatory Visit (INDEPENDENT_AMBULATORY_CARE_PROVIDER_SITE_OTHER): Payer: Medicare Other | Admitting: Internal Medicine

## 2017-12-08 ENCOUNTER — Other Ambulatory Visit (INDEPENDENT_AMBULATORY_CARE_PROVIDER_SITE_OTHER): Payer: Medicare Other

## 2017-12-08 ENCOUNTER — Other Ambulatory Visit: Payer: Self-pay | Admitting: Pharmacist

## 2017-12-08 VITALS — BP 134/84 | HR 71 | Temp 97.7°F | Ht 67.0 in | Wt 252.0 lb

## 2017-12-08 DIAGNOSIS — N182 Chronic kidney disease, stage 2 (mild): Secondary | ICD-10-CM | POA: Diagnosis not present

## 2017-12-08 DIAGNOSIS — E785 Hyperlipidemia, unspecified: Secondary | ICD-10-CM

## 2017-12-08 DIAGNOSIS — D869 Sarcoidosis, unspecified: Secondary | ICD-10-CM | POA: Diagnosis not present

## 2017-12-08 DIAGNOSIS — E538 Deficiency of other specified B group vitamins: Secondary | ICD-10-CM | POA: Diagnosis not present

## 2017-12-08 DIAGNOSIS — I2782 Chronic pulmonary embolism: Secondary | ICD-10-CM

## 2017-12-08 DIAGNOSIS — I2510 Atherosclerotic heart disease of native coronary artery without angina pectoris: Secondary | ICD-10-CM | POA: Diagnosis not present

## 2017-12-08 DIAGNOSIS — F339 Major depressive disorder, recurrent, unspecified: Secondary | ICD-10-CM | POA: Diagnosis not present

## 2017-12-08 DIAGNOSIS — I5030 Unspecified diastolic (congestive) heart failure: Secondary | ICD-10-CM | POA: Diagnosis not present

## 2017-12-08 DIAGNOSIS — I130 Hypertensive heart and chronic kidney disease with heart failure and stage 1 through stage 4 chronic kidney disease, or unspecified chronic kidney disease: Secondary | ICD-10-CM | POA: Diagnosis not present

## 2017-12-08 DIAGNOSIS — K810 Acute cholecystitis: Secondary | ICD-10-CM | POA: Diagnosis not present

## 2017-12-08 DIAGNOSIS — J449 Chronic obstructive pulmonary disease, unspecified: Secondary | ICD-10-CM | POA: Diagnosis not present

## 2017-12-08 DIAGNOSIS — H9193 Unspecified hearing loss, bilateral: Secondary | ICD-10-CM

## 2017-12-08 DIAGNOSIS — I825Y1 Chronic embolism and thrombosis of unspecified deep veins of right proximal lower extremity: Secondary | ICD-10-CM | POA: Diagnosis not present

## 2017-12-08 DIAGNOSIS — K819 Cholecystitis, unspecified: Secondary | ICD-10-CM | POA: Diagnosis not present

## 2017-12-08 DIAGNOSIS — E1122 Type 2 diabetes mellitus with diabetic chronic kidney disease: Secondary | ICD-10-CM

## 2017-12-08 DIAGNOSIS — Z434 Encounter for attention to other artificial openings of digestive tract: Secondary | ICD-10-CM | POA: Diagnosis not present

## 2017-12-08 DIAGNOSIS — J9611 Chronic respiratory failure with hypoxia: Secondary | ICD-10-CM | POA: Diagnosis not present

## 2017-12-08 LAB — CBC WITH DIFFERENTIAL/PLATELET
BASOS PCT: 0.5 % (ref 0.0–3.0)
Basophils Absolute: 0 10*3/uL (ref 0.0–0.1)
EOS ABS: 0.5 10*3/uL (ref 0.0–0.7)
Eosinophils Relative: 6.4 % — ABNORMAL HIGH (ref 0.0–5.0)
HEMATOCRIT: 36.6 % (ref 36.0–46.0)
Hemoglobin: 11.9 g/dL — ABNORMAL LOW (ref 12.0–15.0)
LYMPHS ABS: 3.3 10*3/uL (ref 0.7–4.0)
LYMPHS PCT: 41 % (ref 12.0–46.0)
MCHC: 32.6 g/dL (ref 30.0–36.0)
MCV: 89 fl (ref 78.0–100.0)
Monocytes Absolute: 0.8 10*3/uL (ref 0.1–1.0)
Monocytes Relative: 10.1 % (ref 3.0–12.0)
NEUTROS ABS: 3.4 10*3/uL (ref 1.4–7.7)
Neutrophils Relative %: 42 % — ABNORMAL LOW (ref 43.0–77.0)
PLATELETS: 229 10*3/uL (ref 150.0–400.0)
RBC: 4.11 Mil/uL (ref 3.87–5.11)
RDW: 16.4 % — AB (ref 11.5–15.5)
WBC: 8.1 10*3/uL (ref 4.0–10.5)

## 2017-12-08 LAB — HEPATIC FUNCTION PANEL
ALK PHOS: 116 U/L (ref 39–117)
ALT: 16 U/L (ref 0–35)
AST: 17 U/L (ref 0–37)
Albumin: 3.8 g/dL (ref 3.5–5.2)
BILIRUBIN DIRECT: 0.1 mg/dL (ref 0.0–0.3)
BILIRUBIN TOTAL: 0.3 mg/dL (ref 0.2–1.2)
Total Protein: 7.6 g/dL (ref 6.0–8.3)

## 2017-12-08 LAB — BASIC METABOLIC PANEL
BUN: 17 mg/dL (ref 6–23)
CHLORIDE: 105 meq/L (ref 96–112)
CO2: 26 mEq/L (ref 19–32)
CREATININE: 0.88 mg/dL (ref 0.40–1.20)
Calcium: 9.3 mg/dL (ref 8.4–10.5)
GFR: 70.3 mL/min (ref >60.00)
Glucose, Bld: 126 mg/dL — ABNORMAL HIGH (ref 70–99)
POTASSIUM: 3.8 meq/L (ref 3.5–5.1)
SODIUM: 139 meq/L (ref 135–145)

## 2017-12-08 MED ORDER — CYANOCOBALAMIN 1000 MCG/ML IJ SOLN
1000.0000 ug | Freq: Once | INTRAMUSCULAR | Status: AC
  Administered 2017-12-08: 1000 ug via INTRAMUSCULAR

## 2017-12-08 NOTE — Assessment & Plan Note (Signed)
Not on statins 

## 2017-12-08 NOTE — Patient Outreach (Signed)
[facility]) Care Management  [place] 12/08/2017  [place] 29-Jul-1960 [id number]  Reason for call: f/u on diabetic testing supplies + topical steroid  Call placed to Ms. Cheshire today.   1) I reviewed with patient that according per Medicare Part D plan, [employer], they do not cover any diabetic testing supplies.  Patient's only option is use have medications filled at another pharmacy that can bill for Medicare Part B.   Patient voiced understanding.  She states she can use [facility] representative.  She will contact her endocrinologist to request testing supplies be sent over to this pharmacy.   2) Topical steroid for Lichen Planus.  No return call yet from [facility].  Patient reports she has not received any communication from Dr. Otho Ket or her pharmacy.  3-way call made to [facility] dermatology and 2nd message left requesting substitution for clobetasol as it is not covered via insurance.  Patient continues to use triamcinolone cream until this is resolved.   Plan: I will f/u with patient next week regarding her topical steroid.   Ralene Bathe, PharmD, [place] [phone]

## 2017-12-08 NOTE — Progress Notes (Signed)
Subjective:  Patient ID: [name], female    DOB: 1960-04-12  Age: 57 y.o. MRN: [id number]  CC: No chief complaint on file.   HPI TESSY PAWELSKI presents for sarcoidosis, cholecystitis, COPD f/u  C/o hearing loss    Outpatient Medications Prior to Visit  Medication Sig Dispense Refill  . acetaminophen (TYLENOL) 325 MG tablet Take 2 tablets (650 mg total) by mouth every 6 (six) hours as needed for mild pain, fever or headache. 30 tablet   . Adalimumab (HUMIRA PEN-PS/UV/ADOL HS START) 40 MG/0.8ML PNKT Inject 40 mg into the skin once a week.    [name] augmented betamethasone dipropionate (DIPROLENE-AF) 0.05 % ointment Apply topically 2 (two) times daily.  8  . B-D ULTRA-FINE 33 LANCETS MISC Use as instructed to test sugars 4 times daily DX: E11.8 400 each 1  . budesonide-formoterol (SYMBICORT) 160-4.5 MCG/ACT inhaler Inhale 2 puffs into the lungs TWICE DAILY 120/4=30 10.2 g 0  . carvedilol (COREG) 12.5 MG tablet Take 1 tablet (12.5 mg total) by mouth 2 (two) times daily with a meal. 180 tablet 3  . clonazePAM (KLONOPIN) 0.5 MG tablet Take 1-2 tablets (0.5-1 mg total) by mouth at bedtime. (Patient taking differently: Take 0.125 mg by mouth at bedtime. Pt takes 1/4 tablet=0.125 mg qhs) 60 tablet 3  . esomeprazole (NEXIUM) 40 MG capsule Take 1 capsule (40 mg total) by mouth daily before breakfast. 90 capsule 1  . fluticasone (FLONASE) 50 MCG/ACT nasal spray Place 1 spray into both nostrils daily. 16 g 2  . furosemide (LASIX) 40 MG tablet TAKE 1 TABLET BY MOUTH EVERY MORNING FOR EDEMA (Patient taking differently: Take 40 mg by mouth daily as needed for fluid or edema. ) 90 tablet 1  . gabapentin (NEURONTIN) 300 MG capsule Take 1 capsule (300 mg total) by mouth 2 (two) times daily as needed (for pain). 180 capsule 1  . glucose blood (EASY TALK BLOOD GLUCOSE TEST) test strip Use as instructed 2x a day 200 each 12  . hydrOXYzine (ATARAX/VISTARIL) 25 MG tablet Take 1 tablet (25 mg  total) by mouth every 8 (eight) hours as needed for itching. (Patient taking differently: Take 25 mg by mouth 2 (two) times daily. ) 60 tablet 1  . JANUVIA 50 MG tablet Take 1 tablet (50 mg total) by mouth daily. 90 tablet 0  . levofloxacin (LEVAQUIN) 750 MG tablet Take 1 tablet (750 mg total) by mouth daily. X 7 days 7 tablet 0  . metFORMIN (GLUCOPHAGE-XR) 500 MG 24 hr tablet TAKE 2 TABLETS(1000 MG) BY MOUTH DAILY WITH BREAKFAST    . Probiotic Product (PROBIOTIC-10 PO) Take 1 capsule by mouth daily.    [name] topiramate (TOPAMAX) 50 MG tablet Take 1 tablet (50 mg total) by mouth 2 (two) times daily. 180 tablet 1  . triamcinolone cream (KENALOG) 0.1 % Apply 1 application topically 2 (two) times daily. 80 g 2  . venlafaxine XR (EFFEXOR-XR) 75 MG 24 hr capsule Take 1 capsule (75 mg total) by mouth daily with breakfast. 30 capsule 11  . VENTOLIN HFA 108 (90 Base) MCG/ACT inhaler Inhale 2 puffs into the lungs every 6 (six) hours as needed for wheezing or shortness of breath. 200/8=25 (Patient taking differently: Inhale 2 puffs into the lungs every 6 (six) hours as needed for wheezing or shortness of breath. ) 18 g 11  . VOLTAREN 1 % GEL Apply 2 g topically 2 (two) times daily as needed (for pain). 100/4=25 (Patient taking differently: Apply 4  g topically 4 (four) times daily as needed (pain). ) 100 g 2  . XARELTO 20 MG TABS tablet Take 1 tablet (20 mg total) by mouth at bedtime. 90 tablet 3  . azithromycin (ZITHROMAX) 250 MG tablet Zpack taper as directed (Patient not taking: Reported on 12/03/2017) 6 tablet 0   No facility-administered medications prior to visit.     ROS: Review of Systems  Constitutional: Positive for fatigue. Negative for activity change, appetite change, chills and unexpected weight change.  HENT: Positive for congestion and hearing loss. Negative for mouth sores and sinus pressure.   Eyes: Negative for visual disturbance.  Respiratory: Positive for shortness of breath and wheezing.  Negative for cough and chest tightness.   Cardiovascular: Negative for chest pain, palpitations and leg swelling.  Gastrointestinal: Positive for abdominal distention and abdominal pain. Negative for nausea.  Genitourinary: Negative for difficulty urinating, frequency and vaginal pain.  Musculoskeletal: Positive for arthralgias, back pain, gait problem, neck pain and neck stiffness.  Skin: Positive for wound. Negative for pallor and rash.  Neurological: Positive for dizziness and weakness. Negative for tremors, numbness and headaches.  Psychiatric/Behavioral: Positive for decreased concentration, dysphoric mood and sleep disturbance. Negative for confusion and suicidal ideas. The patient is nervous/anxious.   sad  Objective:  BP 134/84 (BP Location: Left Arm, Patient Position: Sitting, Cuff Size: Large)   Pulse 71   Temp 97.7 F (36.5 C) (Oral)   Ht 5\' 7"  (1.702 m)   Wt 252 lb (114.3 kg)   SpO2 98%   BMI 39.47 kg/m   BP Readings from Last 3 Encounters:  12/08/17 134/84  12/03/17 122/68  11/11/17 127/72    Wt Readings from Last 3 Encounters:  12/08/17 252 lb (114.3 kg)  11/11/17 249 lb (112.9 kg)  11/11/17 252 lb (114.3 kg)    Physical Exam  Constitutional: She appears well-developed. No distress.  HENT:  Head: Normocephalic.  Right Ear: External ear normal.  Left Ear: External ear normal.  Nose: Nose normal.  Mouth/Throat: Oropharynx is clear and moist.  Eyes: Pupils are equal, round, and reactive to light. Conjunctivae are normal. Right eye exhibits no discharge. Left eye exhibits no discharge.  Neck: Normal range of motion. Neck supple. No JVD present. No tracheal deviation present. No thyromegaly present.  Cardiovascular: Normal rate, regular rhythm and normal heart sounds.  Pulmonary/Chest: No stridor. No respiratory distress. She has no wheezes.  Abdominal: Soft. Bowel sounds are normal. She exhibits no distension and no mass. There is no tenderness. There is no  rebound and no guarding.  Musculoskeletal: She exhibits tenderness. She exhibits no edema.  Lymphadenopathy:    She has no cervical adenopathy.  Neurological: She displays normal reflexes. No cranial nerve deficit. She exhibits normal muscle tone. Coordination abnormal.  Skin: No rash noted. No erythema.  Psychiatric: She has a normal mood and affect. Her behavior is normal. Judgment and thought content normal.  walker Ataxic RUQ drain B ears no wax sad  Lab Results  Component Value Date   WBC 9.2 11/11/2017   HGB 10.7 (L) 11/11/2017   HCT 36.2 11/11/2017   PLT 198 11/11/2017   GLUCOSE 107 (H) 11/11/2017   CHOL 237 (H) 06/30/2017   TRIG 271.0 (H) 06/30/2017   HDL 70.00 06/30/2017   LDLDIRECT 107.0 06/30/2017   LDLCALC 97 06/29/2008   ALT 21 11/11/2017   AST 25 11/11/2017   NA 140 11/11/2017   K 3.5 11/11/2017   CL 108 11/11/2017   CREATININE 1.04 (  H) 11/11/2017   BUN 12 11/11/2017   CO2 24 11/11/2017   TSH 2.31 08/23/2016   INR 1.34 10/13/2017   HGBA1C 5.2 10/28/2017   MICROALBUR <0.7 02/01/2015    Ir Cholangiogram Existing Tube  Result Date: 11/21/2017 INDICATION: 57 year old female with a history of acute cholecystitis August 2019. Percutaneous cholecystostomy placed 09/13/2017. The patient returns today with pain at the insertion site. EXAM: CHOLANGIOGRAM VIA EXISTING CATHETER MEDICATIONS: None ANESTHESIA/SEDATION: None FLUOROSCOPY TIME:  Fluoroscopy Time: 0 minutes 18 seconds (10.8 mGy). COMPLICATIONS: None PROCEDURE: Informed written consent was obtained from the patient after a thorough discussion of the procedural risks, benefits and alternatives. All questions were addressed. Maximal Sterile Barrier Technique was utilized including caps, mask, sterile gowns, sterile gloves, sterile drape, hand hygiene and skin antiseptic. A timeout was performed prior to the initiation of the procedure. Visual inspection performed at the site of insertion. She was complaining at the  orientation of the stitching. Betadine was used to prep the tube. Injection was performed with images stored sent to PACs. Catheter was then flushed and attached to gravity drainage. The retention stitch was then ligated and removed and a new stitch was placed after generous infiltration with 1% lidocaine. Patient tolerated the procedure well and remained hemodynamically stable throughout. No complications were encountered and no significant blood loss. IMPRESSION: Status post drain injection confirms position of the percutaneous cholecystostomy within the gallbladder. No retained stones identified. The ductal system was patent. The retention stitch was removed and a new stitch placed after local anesthesia for attempt at a more comfortable arrangement. Signed, Dulcy Fanny. Dellia Nims, RPVI [facility] Electronically Signed   By: Corrie Mckusick D.O.   On: 11/21/2017 16:53    Assessment & Plan:   There are no diagnoses linked to this encounter.   No orders of the defined types were placed in this encounter.    Follow-up: No follow-ups on file.  Walker Kehr, MD

## 2017-12-08 NOTE — Assessment & Plan Note (Signed)
The pt would like to go to [facility] for a consultation - Rheum ref

## 2017-12-08 NOTE — Addendum Note (Signed)
Addended by: Karren Cobble on: 12/08/2017 04:18 PM   Modules accepted: Orders

## 2017-12-08 NOTE — Assessment & Plan Note (Signed)
Drain is still in place - f/u w/IR and surgery

## 2017-12-08 NOTE — Assessment & Plan Note (Signed)
Metformin, Januvia 

## 2017-12-08 NOTE — Assessment & Plan Note (Signed)
Xarelto

## 2017-12-08 NOTE — Assessment & Plan Note (Signed)
Situational Discussed

## 2017-12-08 NOTE — Assessment & Plan Note (Signed)
On Coreg 

## 2017-12-08 NOTE — Assessment & Plan Note (Signed)
On B12 

## 2017-12-09 ENCOUNTER — Ambulatory Visit: Payer: Self-pay | Admitting: Pharmacist

## 2017-12-09 NOTE — Telephone Encounter (Signed)
Done. Thx.

## 2017-12-15 ENCOUNTER — Other Ambulatory Visit: Payer: Self-pay | Admitting: Pulmonary Disease

## 2017-12-15 ENCOUNTER — Other Ambulatory Visit: Payer: Self-pay | Admitting: Family

## 2017-12-15 ENCOUNTER — Other Ambulatory Visit: Payer: Self-pay | Admitting: *Deleted

## 2017-12-15 NOTE — Patient Outreach (Addendum)
[facility]) Care Management  12/15/2017  [name] [facility] [birth year] [id number]   Received call from member voicing concern that she completed her antibiotics that were prescribed for her "cold"; however, member states that she has had little improvement and she is coughing up green colored phlegm. Member states she will follow up with her Pulmonologist for a visit this week.   Member voiced the need for her medications to be refilled: Topiramate and Hydroxyzine. Called member's PCP office and spoke to [place] who stated she will request for refill from [facility] PCP.   Will follow up with member within the next two weeks.  Valente David, BSN, [place] Management  [facility] Care Manager [phone]

## 2017-12-15 NOTE — Telephone Encounter (Signed)
Lelon Frohlich is calling from [facility] and states the patient is also needing a refill on hydrOXYzine (ATARAX/VISTARIL) 25 MG tablet

## 2017-12-17 ENCOUNTER — Ambulatory Visit: Payer: Self-pay | Admitting: Pharmacist

## 2017-12-17 ENCOUNTER — Other Ambulatory Visit: Payer: Self-pay | Admitting: Pharmacist

## 2017-12-17 ENCOUNTER — Ambulatory Visit: Payer: Self-pay | Admitting: Surgery

## 2017-12-17 DIAGNOSIS — K811 Chronic cholecystitis: Secondary | ICD-10-CM | POA: Diagnosis not present

## 2017-12-17 NOTE — Patient Outreach (Signed)
[facility]) Care Management  [place] 12/18/2017  [place] 04-28-1960 [id number]  Reason for call: f/u on topical steroid cream  Successful call to Ms. Washburn.  Patient reports she forgot to request new diabetic testing supplies from provider but will call them today or tomorrow.  She also reports she has not heard back from [facility] dermatology.  We reviewed that I have also reached out several times to the office and have not heard back from office either.  I recommended that patient make an office visit to discuss her steroid with her provider.  Patient agrees and states she will call today to make an appt.  Patient denies other medication issues at this time.    Plan: Will close [facility] pharmacy case at this time.  Am happy to assist in the future as needed.  Patient has been provided [facility] CM contact information.   Ralene Bathe, PharmD, [place] [phone]

## 2017-12-17 NOTE — H&P (Signed)
History of Present Illness [name]. Angela Harkless [name]; 12/17/2017 3:24 PM) The patient is a 57 year old female who presents for evaluation of gall stones. PCP - [name] Pulmonary - [name] Cardiology - [name]  This is a 57 year old female with sarcoidosis, morbid obesity, history of DVT/PE requiring anticoagulation who is status post open appendectomy for perforation in 2009. The patient was hospitalized in August of this year for sepsis secondary to acute cholecystitis. She was not a good surgical candidate and a percutaneous cholecystostomy tube was placed. The patient improved somewhat. She was discharged in early September. A follow-up cholangiogram through her cholecystostomy was performed on 10/08/17 and her tube was capped. She was readmitted several days later with decreased appetite, dark urine and sharp right upper quadrant abdominal pain. Her bilirubin increased to 3.3. Her tube was placed back to drainage. Another cholangiogram was performed on 11/21/17 which shows that the cholecystostomy tube is still within the gallbladder. There are no retained stones. The ductal system was patent. The patient comes in for evaluation to discuss possible cholecystectomy. She still occasionally has some right upper quadrant abdominal pain.  INDICATION: 57 year old female with a history of acute cholecystitis August 2019.  Percutaneous cholecystostomy placed 09/13/2017.  The patient returns today with pain at the insertion site.  EXAM: CHOLANGIOGRAM VIA EXISTING CATHETER  MEDICATIONS: None  ANESTHESIA/SEDATION: None  FLUOROSCOPY TIME: Fluoroscopy Time: 0 minutes 18 seconds (10.8 mGy).  COMPLICATIONS: None  PROCEDURE: Informed written consent was obtained from the patient after a thorough discussion of the procedural risks, benefits and alternatives. All questions were addressed. [name] was utilized including caps, mask, sterile gowns, sterile gloves,  sterile drape, hand hygiene and skin antiseptic. A timeout was performed prior to the initiation of the procedure.  Visual inspection performed at the site of insertion. She was complaining at the orientation of the stitching.  Betadine was used to prep the tube.  Injection was performed with images stored sent to PACs.  Catheter was then flushed and attached to gravity drainage.  The retention stitch was then ligated and removed and a new stitch was placed after generous infiltration with 1% lidocaine.  Patient tolerated the procedure well and remained hemodynamically stable throughout.  No complications were encountered and no significant blood loss.  IMPRESSION: Status post drain injection confirms position of the percutaneous cholecystostomy within the gallbladder. No retained stones identified. The ductal system was patent.  The retention stitch was removed and a new stitch placed after local anesthesia for attempt at a more comfortable arrangement.  Signed,  [name]. [name], RPVI  [facility] Specialists  [facility]   Electronically Signed By: [name] D.O. On: 11/21/2017 16:53   12/08/17 - LFT"s WNL   Problem List/Past Medical Angela Key K. Meeka Cartelli, [name]; 12/17/2017 3:25 PM) CHRONIC CHOLECYSTITIS WITHOUT CALCULUS (K81.1)  Past Surgical History [name], [name]; 12/17/2017 2:12 PM) Appendectomy Breast Biopsy Left. Knee Surgery Bilateral. Oral Surgery  Diagnostic Studies History [name], [name]; 12/17/2017 2:12 PM) Mammogram within last year Pap Smear >5 years ago  Allergies [name], [name]; 12/17/2017 2:17 PM) [name] *HYPNOTICS/SEDATIVES/SLEEP DISORDER AGENTS* night terrors Enalapril Maleate *ANTIHYPERTENSIVES* Cough. Nickel blisters Doxycycline *DERMATOLOGICALS* Diarrhea, Nausea, Vomiting. hydroCHLOROthiazide *DIURETICS* Lyrica *ANTICONVULSANTS* high Adhesive Tape  bruising  Medication History ([name], [name]; 12/17/2017 2:21 PM) Acetaminophen (325MG Tablet, Oral) Active. Betamethasone Dipropionate Aug (0.05% Ointment, External) Active. Budesonide-Formoterol Fumarate (160-4.5MCG/ACT Aerosol, Inhalation) Active. clonazePAM (0.5MG Tablet, Oral) Active. Carvedilol (12.5MG Tablet, Oral) Active. Fluticasone Propionate (50MCG/ACT Suspension, Nasal)  Active. Esomeprazole Magnesium ('40MG'$  Capsule DR, Oral) Active. Gabapentin ('300MG'$  Capsule, Oral) Active. Furosemide ('40MG'$  Tablet, Oral) Active. Humira Pen-CD/UC/HS Starter ('40MG'$ /0.8ML Pen-inj Kit, Subcutaneous) Active. hydrOXYzine HCl ('25MG'$  Tablet, Oral) Active. Januvia ('50MG'$  Tablet, Oral) Active. levoFLOXacin ('750MG'$  Tablet, Oral) Active. metFORMIN HCl ER ('500MG'$  Tablet ER 24HR, Oral) Active. Probiotic (Oral) Active. Topiramate ('50MG'$  Tablet, Oral) Active. Triamcinolone Acetonide (0.1% Cream, External) Active. Venlafaxine HCl ER ('75MG'$  Capsule ER 24HR, Oral) Active. Voltaren (1% Gel, Transdermal) Active. Xarelto ('20MG'$  Tablet, Oral) Active. Ventolin HFA (108 (90 Base)MCG/ACT Aerosol Soln, Inhalation) Active. Medications Reconciled  Social History [name], [name]; 12/17/2017 2:12 PM) Alcohol use Occasional alcohol use. Illicit drug use Remotely quit drug use. No caffeine use Tobacco use Former smoker.  Family History [name], [place]; 12/17/2017 2:12 PM) Alcohol Abuse Father. Arthritis Father, Sister. Breast Cancer Sister. Cancer Father. Cerebrovascular Accident Brother. Depression Son. Diabetes Mellitus Mother. Heart Disease Brother, Mother, Sister. Heart disease in female family member before age 41 Heart disease in female family member before age 47 Respiratory Condition Mother.  Pregnancy / Birth History [name], [place]; 12/17/2017 2:12 PM) Age at menarche 74 years. Gravida 1 Length (months) of breastfeeding 3-6 Maternal age 91-25 Para  1  Other Problems [name]. Angela Placzek, [name]; 12/17/2017 3:25 PM) Anxiety Disorder Arthritis Back Pain Chronic Obstructive Lung Disease Depression Diabetes Mellitus Gastroesophageal Reflux Disease High blood pressure Home Oxygen Use Hypercholesterolemia Pulmonary Embolism / Blood Clot in Legs     Review of Systems ([name] [name]; 12/17/2017 2:12 PM) General Present- Chills, Fatigue and Night Sweats. Not Present- Appetite Loss, Fever, Weight Gain and Weight Loss. Skin Present- Dryness. Not Present- Change in Wart/Mole, Hives, Jaundice, New Lesions, Non-Healing Wounds, Rash and Ulcer. HEENT Present- Hearing Loss, Nose Bleed, Ringing in the Ears and Sore Throat. Not Present- Earache, Hoarseness, Oral Ulcers, Seasonal Allergies, Sinus Pain, Visual Disturbances, Wears glasses/contact lenses and Yellow Eyes. Respiratory Present- Chronic Cough and Difficulty Breathing. Not Present- Bloody sputum, Snoring and Wheezing. Cardiovascular Present- Shortness of Breath. Not Present- Chest Pain, Difficulty Breathing Lying Down, Leg Cramps, Palpitations, Rapid Heart Rate and Swelling of Extremities. Gastrointestinal Present- Abdominal Pain, Difficulty Swallowing and Excessive gas. Not Present- Bloating, Bloody Stool, Change in Bowel Habits, Chronic diarrhea, Constipation, Gets full quickly at meals, Hemorrhoids, Indigestion, Nausea, Rectal Pain and Vomiting. Female Genitourinary Present- Nocturia and Urgency. Not Present- Frequency, Painful Urination and Pelvic Pain. Musculoskeletal Present- Back Pain, Joint Pain, Joint Stiffness and Muscle Weakness. Not Present- Muscle Pain and Swelling of Extremities. Neurological Present- Decreased Memory, Trouble walking and Weakness. Not Present- Fainting, Headaches, Numbness, Seizures, Tingling and Tremor. Psychiatric Present- Anxiety, Change in Sleep Pattern and Depression. Not Present- Bipolar, Fearful and Frequent crying. Endocrine Present- Cold  Intolerance, Hair Changes and Heat Intolerance. Not Present- Excessive Hunger, Hot flashes and New Diabetes. Hematology Present- Blood Thinners and Easy Bruising. Not Present- Excessive bleeding, Gland problems, HIV and Persistent Infections.  Vitals ([name] [name]; 12/17/2017 2:13 PM) 12/17/2017 2:12 PM Weight: 254 lb Height: 67.5in Body Surface Area: 2.25 m Body Mass Index: 39.19 kg/m  Temp.: 98.89F  Pulse: 91 (Regular)  P.OX: 90% (Room air) BP: 124/76 (Sitting, Left Arm, Standard)      Physical Exam Angela Key K. Elida Harbin [name]; 12/17/2017 3:26 PM)  The physical exam findings are as follows: Note:WDWN in NAD - obese; minimally mobile Eyes: Pupils equal, round; sclera anicteric HENT: Oral mucosa moist; good dentition Neck: No masses palpated, no thyromegaly Lungs: CTA bilaterally; normal respiratory effort CV: Regular rate and rhythm; no murmurs; extremities  well-perfused with no edema Abd: +bowel sounds, obese, soft; RUQ drain with clear bile; minimal soreness around drain site. Drain site without sign of infection No hernia - large midline incision Skin: Warm, dry; no sign of jaundice Psychiatric - alert and oriented x 4; calm mood and affect    Assessment & Plan Angela Key K. Phiona Ramnauth [name]; 12/17/2017 3:27 PM)  CHRONIC CHOLECYSTITIS WITHOUT CALCULUS (K81.1)  Current Plans Schedule for Surgery - Laparoscopic cholecystectomy with intraoperative cholangiogram. The surgical procedure has been discussed with the patient. Potential risks, benefits, alternative treatments, and expected outcomes have been explained. All of the patient's questions at this time have been answered. The likelihood of reaching the patient's treatment goal is good. The patient understand the proposed surgical procedure and wishes to proceed.   Cardiac and pulmonary clearance  Note:The patient has significant medical and cardiac comorbidities that make her high risk for surgery. However she has  not tolerated having her tube capped so we cannot consider removing her drain. We will ask her cardiologist and pulmonologist to see if they think that she is a candidate for elective surgery. Her surgery will be complicated by the fact that she has had a large previous exploratory laparotomy. We should still be able to perform this laparoscopically. She will probably require hospitalization for several days postop. Will need to hold her anticoagulation prior to surgery.  We will wait to hear back from pulmonology as well as cardiology.  [name]. Georgette Dover, [name], [place] Tra[facility] Surgery Beeper [phone]  12/17/2017 3:28 PM

## 2017-12-18 ENCOUNTER — Ambulatory Visit (INDEPENDENT_AMBULATORY_CARE_PROVIDER_SITE_OTHER): Payer: Medicare Other | Admitting: Adult Health

## 2017-12-18 ENCOUNTER — Telehealth: Payer: Self-pay

## 2017-12-18 ENCOUNTER — Encounter: Payer: Self-pay | Admitting: Adult Health

## 2017-12-18 VITALS — BP 122/80 | HR 64 | Temp 97.9°F | Ht 67.0 in | Wt 254.4 lb

## 2017-12-18 DIAGNOSIS — K819 Cholecystitis, unspecified: Secondary | ICD-10-CM | POA: Diagnosis not present

## 2017-12-18 DIAGNOSIS — J449 Chronic obstructive pulmonary disease, unspecified: Secondary | ICD-10-CM | POA: Diagnosis not present

## 2017-12-18 DIAGNOSIS — J441 Chronic obstructive pulmonary disease with (acute) exacerbation: Secondary | ICD-10-CM | POA: Diagnosis not present

## 2017-12-18 DIAGNOSIS — D869 Sarcoidosis, unspecified: Secondary | ICD-10-CM

## 2017-12-18 DIAGNOSIS — I130 Hypertensive heart and chronic kidney disease with heart failure and stage 1 through stage 4 chronic kidney disease, or unspecified chronic kidney disease: Secondary | ICD-10-CM | POA: Diagnosis not present

## 2017-12-18 DIAGNOSIS — I2510 Atherosclerotic heart disease of native coronary artery without angina pectoris: Secondary | ICD-10-CM | POA: Diagnosis not present

## 2017-12-18 DIAGNOSIS — J181 Lobar pneumonia, unspecified organism: Secondary | ICD-10-CM

## 2017-12-18 DIAGNOSIS — J9611 Chronic respiratory failure with hypoxia: Secondary | ICD-10-CM | POA: Diagnosis not present

## 2017-12-18 DIAGNOSIS — I5189 Other ill-defined heart diseases: Secondary | ICD-10-CM

## 2017-12-18 DIAGNOSIS — I5030 Unspecified diastolic (congestive) heart failure: Secondary | ICD-10-CM | POA: Diagnosis not present

## 2017-12-18 DIAGNOSIS — Z434 Encounter for attention to other artificial openings of digestive tract: Secondary | ICD-10-CM | POA: Diagnosis not present

## 2017-12-18 MED ORDER — AMOXICILLIN-POT CLAVULANATE 875-125 MG PO TABS: 1.0000 | ORAL_TABLET | Freq: Two times a day (BID) | ORAL | 0 refills | Status: AC

## 2017-12-18 MED ORDER — PREDNISONE 10 MG PO TABS: ORAL_TABLET | ORAL | 0 refills | Status: DC

## 2017-12-18 NOTE — Telephone Encounter (Signed)
   [facility] Pre-operative Risk Assessment    Request for surgical clearance:  1. What type of surgery is being performed? Laparoscopic cholecystectomy with intraoperative cholanglogram.  2. When is this surgery scheduled? TBD   3. What type of clearance is required (medical clearance vs. Pharmacy clearance to hold med vs. Both)?  BOTH  4. Are there any medications that need to be held prior to surgery and how long? Xarelto   5. Practice name and name of physician performing surgery? [facility], Dr. Donnie Mesa  6. What is your office phone number [phone]    7.   What is your office fax number  [phone]  8.   Anesthesia type (None, local, MAC, general) ? General anesthesia   [place] 12/18/2017, 11:42 AM  _________________________________________________________________

## 2017-12-18 NOTE — Assessment & Plan Note (Signed)
Recurrent exacerbations with underlying sarcoidosis-patient is immunosuppressed.  (On Humira) She has had multiple antibiotics.  Will check a sputum culture. Begin empiric antibiotics with Augmentin.  Low-dose prednisone taper.  As patient is a diabetic. Recent CT abdomen showed nodular densities in the right and lower lungs.  Will need a CT chest for further evaluation  Plan  Patient Instructions  Set up for CT chest  (do not take Metformin day of scan , resume after 48hr )  Sputum culture .  Augmentin 875mg  Twice daily  For 7 days , take with food.   Prednisone 20mg  daily for 1 week then 10mg  daily for 1 week and stop .  Mucinex DM Twice daily  As needed  Cough /congestion .  Fluids and rest .  Saline nasal rinses As needed   Symbicort 2 puffs Twice daily  , rinse after use.  Continue on Oxygen 2l/m .  Continue on Xarelto , call if bloody mucus persists.  Follow up in 1 week with Dr. Elsworth Soho  Or Margot Oriordan NP and As needed   Please contact office for sooner follow up if symptoms do not improve or worsen or seek emergency care

## 2017-12-18 NOTE — Assessment & Plan Note (Signed)
Appears compensated without evidence of volume overload on exam.  Continue on current regimen 

## 2017-12-18 NOTE — Addendum Note (Signed)
Addended by: Parke Poisson E on: 12/18/2017 03:50 PM   Modules accepted: Orders

## 2017-12-18 NOTE — Assessment & Plan Note (Signed)
Cont on O2 .  

## 2017-12-18 NOTE — Patient Instructions (Addendum)
Set up for CT chest  (do not take Metformin day of scan , resume after 48hr )  Sputum culture .  Augmentin 875mg  Twice daily  For 7 days , take with food.   Prednisone 20mg  daily for 1 week then 10mg  daily for 1 week and stop .  Mucinex DM Twice daily  As needed  Cough /congestion .  Fluids and rest .  Saline nasal rinses As needed   Symbicort 2 puffs Twice daily  , rinse after use.  Continue on Oxygen 2l/m .  Continue on Xarelto , call if bloody mucus persists.  Follow up in 1 week with Dr. Elsworth Soho  Or Neela Zecca NP and As needed   Please contact office for sooner follow up if symptoms do not improve or worsen or seek emergency care

## 2017-12-18 NOTE — Progress Notes (Signed)
@Patient  ID: [name], female    DOB: 04-08-1960, 57 y.o.   MRN: [id number]  Chief Complaint  Patient presents with  . 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   LE Venous Doppler 08/18/15 >>neg DVT , no bakers cyst, ?prominent inguinal lymph node measuring 3.1cm   MRI brain neg [facility] = MRI spine = mild to moderate disc protrusion/left-sided spinal stenosis-chronic pain  NPSG 12/2004 mild, RDI 13/h  12/18/2017 Acute OV : Cough  Patient presents for an acute office visit.  She complains over the last week that she has cough congestion with very thick mucus that is difficult to cough up.  She seen a few streaks of blood mixed in with her mucus.  She has had wheezing that has developed over the last 24 hours.  Feels more short of breath. Says that over the last 6 weeks she has had 2 previous episodes of respiratory illnesses.  Says she was told that she had pneumonia at the end of October and was given Levaquin in the emergency room.  Emergency room records show CT abdomen and pelvis noted a increased left basilar nodular density at 1.7 cm and an increased right basilar nodular density at 1.3 cm. He says that her cough and congestion improved.  About 2 weeks  ago she did started developing cough and congestion.  Was given a Z-Pak.  Did have some improvement but did not totally resolve.  And then yesterday symptoms worsened as above  Patient has a planned surgery next month for a cholecystectomy. Has perc drain to Gallbladder since 08/2017 .  Says that this is been doing well without any notable problems.  No redness at drain site.  Lab work viewed on 12/08/2017 was normal.  White blood cell count was normal.  Anemia was improved.  She has underlying sarcoidosis with pulmonary and cutaneous involvement.  She is on Humira.  Says her last injection was yesterday.  Patient says appetite is good.  She has no nausea vomiting diarrhea.  No fever.  No increased leg swelling.  Patient does have chronic leg swelling and says that it is actually been doing very well the last several weeks.   denies any orthopnea  She remains on 2 L of oxygen.  Says O2 saturations have been doing well. Allergies  Allergen Reactions  . Belsomra [Suvorexant] Other (See Comments)    [place] out of bed while asleep: "I'm waking up as I'm falling on the floor;" "Night terrors"  . Enalapril Maleate Cough  . Latex Hives  . Nickel Other (See Comments)    Blisters  Pt has a titanium right and left knee - nickel causes skin irritations that form into blisters and sores  . Other Other (See Comments)    Patient has Sarcoidosis and  can't tolerate any metals  . Doxycycline Diarrhea and Nausea And Vomiting  . Hydrochlorothiazide Other (See Comments)    Low potassium levels   . Hydroxychloroquine Sulfate Other (See Comments)    Vision changes   . Lyrica [Pregabalin] Other (See Comments)    "Made me feel high"  . Tape Other (See Comments)    Medical tape causes bruising    Immunization History  Administered Date(s) Administered  . H1N1 12/24/2007  . Influenza Split 10/11/2010  . Influenza Whole 10/14/2007, 10/05/2008, 10/26/2009, 11/13/2011  . Influenza,inj,Quad PF,6+ Mos 09/21/2012,  09/24/2013, 10/06/2014, 09/05/2015, 11/06/2016, 09/24/2017  . PPD Test 11/18/2014  . Pneumococcal Conjugate-13 07/30/2010, 12/29/2012  . Pneumococcal Polysaccharide-23 12/23/2013  . Tdap 06/28/2014    Past Medical History:  Diagnosis Date  . Allergic rhinitis   . ALLERGIC RHINITIS 10/26/2009  . Anxiety   . ANXIETY 08/14/2006  . B12 DEFICIENCY 08/25/2007  . Complication of anesthesia    pt has had difficulty following anesthesia with her knee in 2016-unable to care for herself afterward  . Confusion   . Depression    takes Lexapro daily  . Diabetes mellitus without complication ([place])    was on insulin but has been off since Nov 2015 and now only takes Metformin daily  . DYSPNEA 04/28/2009   with exertion  . Esophageal reflux    takes Nexium daily  . Fibromyalgia   . Headache    last migraine 2-33yrs ago;takes Topamax daily  . History of shingles   . Hypertension    takes Coreg daily  . Insomnia    takes Nortriptyline nightly   . Joint pain   . Joint swelling   . Left knee pain   . Lichen planus   . Long-term memory loss   . Nocturia   . OSA (obstructive sleep apnea)    doesn't use CPAP;sleep study in epic from 2006  . Osteoarthritis   . Osteoarthritis   . Pneumonia    over 30 yrs ago  . Protein calorie malnutrition ([place])   . Rheumatoid arthritis ([place])   . Sarcoidosis    Dr. Lolita Patella  . Short-term memory loss   . Shortness of breath   . TIA (transient ischemic attack)   . Unsteady gait   . Urinary urgency   . Vitamin D deficiency    is supposed to take Vit D but can't afford it  . VITAMIN D DEFICIENCY 08/25/2007    Tobacco History: Social History   Tobacco Use  Smoking Status Former Smoker  . Packs/day: 0.50  . Years: 10.00  . Pack years: 5.00  . Types: Cigarettes  Smokeless Tobacco Never Used  Tobacco Comment   quit smoking in 2004   Counseling given: Not Answered Comment: quit smoking in 2004   Outpatient Medications Prior to Visit  Medication  Sig Dispense Refill  . acetaminophen (TYLENOL) 325 MG tablet Take 2 tablets (650 mg total) by mouth every 6 (six) hours as needed for mild pain, fever or headache. 30 tablet   . Adalimumab (HUMIRA PEN-PS/UV/ADOL HS START) 40 MG/0.8ML PNKT Inject 40 mg into the skin once a week.    [name] augmented betamethasone dipropionate (DIPROLENE-AF) 0.05 % ointment Apply topically 2 (two) times daily.  8  . B-D ULTRA-FINE 33 LANCETS MISC Use as instructed to test sugars 4 times daily DX: E11.8 400 each 1  . budesonide-formoterol (SYMBICORT) 160-4.5 MCG/ACT inhaler Inhale 2 puffs into the lungs TWICE DAILY 120/4=30 10.2 g 0  . carvedilol (COREG)  12.5 MG tablet Take 1 tablet (12.5 mg total) by mouth 2 (two) times daily with a meal. 180 tablet 3  . clonazePAM (KLONOPIN) 0.5 MG tablet Take 1-2 tablets (0.5-1 mg total) by mouth at bedtime. (Patient taking differently: Take 0.125 mg by mouth at bedtime. Pt takes 1/4 tablet=0.125 mg qhs) 60 tablet 3  . esomeprazole (NEXIUM) 40 MG capsule Take 1 capsule (40 mg total) by mouth daily before breakfast. 90 capsule 1  . furosemide (LASIX) 40 MG tablet TAKE 1 TABLET BY MOUTH EVERY MORNING FOR EDEMA (Patient taking differently: Take 40 mg by mouth daily as needed for fluid or edema. ) 90 tablet 1  . gabapentin (NEURONTIN) 300 MG capsule Take 1 capsule (300 mg total) by mouth 2 (two) times daily as needed (for pain). 180 capsule 1  . glucose blood (EASY TALK BLOOD GLUCOSE TEST) test strip Use as instructed 2x a day 200 each 12  . hydrOXYzine (ATARAX/VISTARIL) 25 MG tablet Take 1 tablet (25 mg total) by mouth every 8 (eight) hours as needed for itching. (Patient taking differently: Take 25 mg by mouth 2 (two) times daily. ) 60 tablet 1  . JANUVIA 50 MG tablet Take 1 tablet (50 mg total) by mouth daily. 90 tablet 0  . levofloxacin (LEVAQUIN) 750 MG tablet Take 1 tablet (750 mg total) by mouth daily. X 7 days 7 tablet 0  . metFORMIN (GLUCOPHAGE-XR) 500 MG 24 hr tablet TAKE 2  TABLETS(1000 MG) BY MOUTH DAILY WITH BREAKFAST    . Probiotic Product (PROBIOTIC-10 PO) Take 1 capsule by mouth daily.    [name] topiramate (TOPAMAX) 50 MG tablet Take 1 tablet (50 mg total) by mouth 2 (two) times daily. 180 tablet 1  . triamcinolone cream (KENALOG) 0.1 % Apply 1 application topically 2 (two) times daily. 80 g 2  . venlafaxine XR (EFFEXOR-XR) 75 MG 24 hr capsule Take 1 capsule (75 mg total) by mouth daily with breakfast. 30 capsule 11  . VENTOLIN HFA 108 (90 Base) MCG/ACT inhaler Inhale 2 puffs into the lungs every 6 (six) hours as needed for wheezing or shortness of breath. 200/8=25 (Patient taking differently: Inhale 2 puffs into the lungs every 6 (six) hours as needed for wheezing or shortness of breath. ) 18 g 11  . VOLTAREN 1 % GEL Apply 2 g topically 2 (two) times daily as needed (for pain). 100/4=25 (Patient taking differently: Apply 4 g topically 4 (four) times daily as needed (pain). ) 100 g 2  . XARELTO 20 MG TABS tablet Take 1 tablet (20 mg total) by mouth at bedtime. 90 tablet 3  . fluticasone (FLONASE) 50 MCG/ACT nasal spray Place 1 spray into both nostrils daily. (Patient not taking: Reported on 12/18/2017) 16 g 2   No facility-administered medications prior to visit.      Review of Systems  Constitutional:   No  weight loss, night sweats,  Fevers, chills,  +fatigue, or  lassitude.  HEENT:   No headaches,  Difficulty swallowing,  Tooth/dental problems, or  Sore throat,                No sneezing, itching, ear ache, + nasal congestion, post nasal drip,   CV:  No chest pain,  Orthopnea, PND, swelling in lower extremities, anasarca, dizziness, palpitations, syncope.   GI  No heartburn, indigestion, abdominal pain, nausea, vomiting, diarrhea, change in bowel habits, loss of appetite, bloody stools.  Gallbladder drain+  Resp:  No chest wall deformity  Skin: no rash  or lesions.  GU: no dysuria, change in color of urine, no urgency or frequency.  No flank pain, no  hematuria   MS:  No joint pain or swelling.  No decreased range of motion.  No back pain.    Physical Exam  BP 122/80 (BP Location: Left Arm, Cuff Size: Normal)   Pulse 64   Temp 97.9 F (36.6 C)   Ht 5\' 7"  (1.702 m)   Wt 254 lb 6.4 oz (115.4 kg)   SpO2 99%   BMI 39.84 kg/m   GEN: A/Ox3; pleasant , NAD, obese, chronically ill-appearing, walker, oxygen   HEENT:  [place]/AT,  EACs-clear, TMs-wnl, NOSE-clear, THROAT-clear, no lesions, no postnasal drip or exudate noted.   NECK:  Supple w/ fair ROM; no JVD; normal carotid impulses w/o bruits; no thyromegaly or nodules palpated; no lymphadenopathy.    RESP decreased breath sounds in the bases  w/o, wheezes/ rales/ or rhonchi. no accessory muscle use, no dullness to percussion  CARD:  RRR, no m/r/g, trace peripheral edema, pulses intact, no cyanosis or clubbing.  GI:   Soft & nt; nml bowel sounds; no organomegaly or masses detected.  Percutaneous drain with bag right upper quadrant dressing clean and dry no surrounding redness noted  Musco: Warm bil, no deformities or joint swelling noted.   Neuro: alert, no focal deficits noted.    Skin: Warm, no lesions or rashes    Lab Results:  CBC    Component Value Date/Time   WBC 8.1 12/08/2017 1615   RBC 4.11 12/08/2017 1615   HGB 11.9 (L) 12/08/2017 1615   HGB 13.4 12/06/2013 0930   HCT 36.6 12/08/2017 1615   HCT 43.0 12/06/2013 0930   PLT 229.0 12/08/2017 1615   PLT 216 12/06/2013 0930   MCV 89.0 12/08/2017 1615   MCV 92.1 12/06/2013 0930   MCH 28.2 11/11/2017 1609   MCHC 32.6 12/08/2017 1615   RDW 16.4 (H) 12/08/2017 1615   RDW 14.5 12/06/2013 0930   LYMPHSABS 3.3 12/08/2017 1615   LYMPHSABS 1.7 12/06/2013 0930   MONOABS 0.8 12/08/2017 1615   MONOABS 0.6 12/06/2013 0930   EOSABS 0.5 12/08/2017 1615   EOSABS 0.1 12/06/2013 0930   BASOSABS 0.0 12/08/2017 1615   BASOSABS 0.0 12/06/2013 0930    BMET    Component Value Date/Time   NA 139 12/08/2017 1615   NA 144  07/19/2015   NA 140 12/06/2013 0930   K 3.8 12/08/2017 1615   K 3.7 12/06/2013 0930   CL 105 12/08/2017 1615   CO2 26 12/08/2017 1615   CO2 24 12/06/2013 0930   GLUCOSE 126 (H) 12/08/2017 1615   GLUCOSE 110 12/06/2013 0930   BUN 17 12/08/2017 1615   BUN 12 07/19/2015   BUN 11.0 12/06/2013 0930   CREATININE 0.88 12/08/2017 1615   CREATININE 1.1 12/06/2013 0930   CALCIUM 9.3 12/08/2017 1615   CALCIUM 9.4 12/06/2013 0930   GFRNONAA 58 (L) 11/11/2017 1609   GFRAA >60 11/11/2017 1609    BNP    Component Value Date/Time   BNP 204.1 (H) 09/12/2017 1900    ProBNP    Component Value Date/Time   PROBNP 194.0 (H) 11/11/2016 1051    Imaging:   cyanocobalamin ((VITAMIN B-12)) injection 1,000 mcg    Date Action Dose Route User   12/08/2017 1617 Given 1000 mcg Intramuscular (Right Deltoid) Karren Cobble, CMA      PFT Results Latest Ref Rng & Units 12/30/2016 05/18/2014  FVC-Pre L 2.07 2.29  FVC-Predicted Pre % 54 61  FVC-Post L 2.18 2.43  FVC-Predicted Post % 57 65  Pre FEV1/FVC % % 70 69  Post FEV1/FCV % % 68 70  FEV1-Pre L 1.46 1.58  FEV1-Predicted Pre % 49 54  FEV1-Post L 1.49 1.70  DLCO UNC% % 41 62  DLCO COR %Predicted % 66 91  TLC L 4.07 -  TLC % Predicted % 74 -  RV % Predicted % 92 -    No results found for: NITRICOXIDE      Assessment & Plan:   COPD (chronic obstructive pulmonary disease) (HCC) Recurrent exacerbations with underlying sarcoidosis-patient is immunosuppressed.  (On Humira) She has had multiple antibiotics.  Will check a sputum culture. Begin empiric antibiotics with Augmentin.  Low-dose prednisone taper.  As patient is a diabetic. Recent CT abdomen showed nodular densities in the right and lower lungs.  Will need a CT chest for further evaluation  Plan  Patient Instructions  Set up for CT chest  (do not take Metformin day of scan , resume after 48hr )  Sputum culture .  Augmentin 875mg  Twice daily  For 7 days , take with food.     Prednisone 20mg  daily for 1 week then 10mg  daily for 1 week and stop .  Mucinex DM Twice daily  As needed  Cough /congestion .  Fluids and rest .  Saline nasal rinses As needed   Symbicort 2 puffs Twice daily  , rinse after use.  Continue on Oxygen 2l/m .  Continue on Xarelto , call if bloody mucus persists.  Follow up in 1 week with Dr. Elsworth Soho  Or  NP and As needed   Please contact office for sooner follow up if symptoms do not improve or worsen or seek emergency care        Diastolic dysfunction Appears compensated without evidence of volume overload on exam.  Continue on current regimen  Sarcoidosis Underlying severe sarcoidosis with pulmonary and cutaneous involvement on Humira. Education regarding holding Humira during acute illnesses. Short steroid taper.  Recent CT abdomen showed some nodular densities questionable etiology.  Will need a dedicated CT chest for further evaluation She is a diabetic and is on metformin.  Of advised her to hold for 48 hours with CT chest for kidney protection   Chronic respiratory failure with hypoxia ([place]) Cont on O2       , NP 12/18/2017

## 2017-12-18 NOTE — Assessment & Plan Note (Addendum)
Underlying severe sarcoidosis with pulmonary and cutaneous involvement on Humira. Education regarding holding Humira during acute illnesses. Short steroid taper.  Recent CT abdomen showed some nodular densities questionable etiology.  Will need a dedicated CT chest for further evaluation She is a diabetic and is on metformin.  Of advised her to hold for 48 hours with CT chest for kidney protection

## 2017-12-19 NOTE — Telephone Encounter (Signed)
Pt states that she is not being followed by any cardiologist

## 2017-12-19 NOTE — Telephone Encounter (Signed)
    This patient has not been seen in our office since 2014 and only for mild coronary calcium noted on CT. She had a normal stress test and recent echo has been normal. By chart review She is apparently on Xarelto for PE per pulmonology.   Upon call to patient, she says that she is not under any cardiology care. PCP should be contacted regarding clearance and if cardiac clearance felt to be needed she would require referral for an office visit with [place] for clearance.   I will fax this to the requesting provider and remove from Preop pool.   Daune Perch, AGNP-C [facility] 12/19/2017  9:33 AM

## 2017-12-19 NOTE — Telephone Encounter (Signed)
This patient has not been seen in our office since 2014 and only for mild coronary calcium noted on CT. He had a normal stress test and recent echo has been normal. By chart review he is apparently on Xarelto for PE- not followed by our office.  We will call the patient to see if he is followed by cardiology at another practice. Otherwise his PCP should be contacted regarding clearance and if cardiac clearance felt to be needed he would require an office visit with [place] for clearance.   preop callback- please call pt to see if he is followed somewhere else. Route back to preop pool.

## 2017-12-21 LAB — RESPIRATORY CULTURE OR RESPIRATORY AND SPUTUM CULTURE
MICRO NUMBER: 91458139
RESULT:: NORMAL
SPECIMEN QUALITY: ADEQUATE

## 2017-12-23 ENCOUNTER — Telehealth: Payer: Self-pay | Admitting: Pulmonary Disease

## 2017-12-23 ENCOUNTER — Other Ambulatory Visit: Payer: Self-pay | Admitting: Family

## 2017-12-23 NOTE — Telephone Encounter (Signed)
She is at moderate risk for laparoscopic abdominal surgery for postop pulmonary complications. 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 As such it is okay to stop Xarelto for 2 days prior to surgery, and resume on evening of surgery if okay

## 2017-12-23 NOTE — Telephone Encounter (Signed)
Received a surgical clearance from [facility]. Per the fax, she needs to be scheduled for laparoscopic cholecystectomy. She is currently on Xarelto and they are requesting instructions on how she should hold the Xarelto before and after her surgery.   Per her chart, she was last seen by TP on 12/18/17.   I have the clearance letter in my look-at. RA, are you ok with clearing her for this surgery? Please advise. Thanks!

## 2017-12-24 ENCOUNTER — Telehealth: Payer: Self-pay

## 2017-12-24 ENCOUNTER — Other Ambulatory Visit: Payer: Self-pay | Admitting: Family

## 2017-12-24 ENCOUNTER — Other Ambulatory Visit: Payer: Self-pay | Admitting: Internal Medicine

## 2017-12-24 ENCOUNTER — Other Ambulatory Visit: Payer: Self-pay

## 2017-12-24 ENCOUNTER — Telehealth: Payer: Self-pay | Admitting: Internal Medicine

## 2017-12-24 DIAGNOSIS — Z01818 Encounter for other preprocedural examination: Secondary | ICD-10-CM

## 2017-12-24 NOTE — Telephone Encounter (Signed)
Copied from [place] [id number]. Topic: Quick Communication - Rx Refill/Question >> Dec 24, 2017  4:36 PM Sheppard Coil, [employer] L wrote: Medication:  gabapentin (NEURONTIN) 300 MG capsule hydrOXYzine (ATARAX/VISTARIL) 25 MG tablet  Has the patient contacted their pharmacy? Yes, but states they keep getting denials and pt wants to know why (Agent: If no, request that the patient contact the pharmacy for the refill.) (Agent: If yes, when and what did the pharmacy advise?)  Preferred Pharmacy (with phone number or street name): [facility], [place], [place] - [address] [place] [phone] (Phone) [phone] (Fax)  Agent: Please be advised that RX refills may take up to 3 business days. We ask that you follow-up with your pharmacy.

## 2017-12-24 NOTE — Telephone Encounter (Signed)
Copied from [place] [id number]. Topic: General - Other >> Dec 24, 2017  4:10 PM Oneta Rack wrote: Osvaldo Human name: Claiborne Billings  Relation to pt: [facility]  Call back number: [phone] fax# [phone]    Reason for call:   Requesting cardiac clearance, patient hasn't been seen by cardiologist since 2014, requesting referral to specialist.

## 2017-12-24 NOTE — Telephone Encounter (Signed)
MEDICATION: metFORMIN (GLUCOPHAGE-XR) 500 MG 24 hr tablet  PHARMACY:  Contacted. [place], [place]   IS THIS A 90 DAY SUPPLY : 180 pills  IS PATIENT OUT OF MEDICATION: Yes  IF NOT; HOW MUCH IS LEFT:   LAST APPOINTMENT DATE: @10 /15/2019  NEXT APPOINTMENT DATE:@2 /18/2020  DO WE HAVE YOUR PERMISSION TO LEAVE A DETAILED MESSAGE:  OTHER COMMENTS:    **Let patient know to contact pharmacy at the end of the day to make sure medication is ready. **  ** Please notify patient to allow 48-72 hours to process**  **Encourage patient to contact the pharmacy for refills or they can request refills through [facility]**

## 2017-12-24 NOTE — Telephone Encounter (Signed)
Yes, please refill

## 2017-12-24 NOTE — Telephone Encounter (Signed)
Ok to refill this-was filled previously by another provider please advise

## 2017-12-24 NOTE — Progress Notes (Signed)
Called spoke with patient, advised of sputum results / recs as stated by TP.  Pt verbalized understanding and denied any questions.

## 2017-12-25 ENCOUNTER — Other Ambulatory Visit: Payer: Self-pay

## 2017-12-25 DIAGNOSIS — I130 Hypertensive heart and chronic kidney disease with heart failure and stage 1 through stage 4 chronic kidney disease, or unspecified chronic kidney disease: Secondary | ICD-10-CM | POA: Diagnosis not present

## 2017-12-25 DIAGNOSIS — K819 Cholecystitis, unspecified: Secondary | ICD-10-CM | POA: Diagnosis not present

## 2017-12-25 DIAGNOSIS — J449 Chronic obstructive pulmonary disease, unspecified: Secondary | ICD-10-CM | POA: Diagnosis not present

## 2017-12-25 DIAGNOSIS — J9611 Chronic respiratory failure with hypoxia: Secondary | ICD-10-CM | POA: Diagnosis not present

## 2017-12-25 DIAGNOSIS — I5030 Unspecified diastolic (congestive) heart failure: Secondary | ICD-10-CM | POA: Diagnosis not present

## 2017-12-25 DIAGNOSIS — Z434 Encounter for attention to other artificial openings of digestive tract: Secondary | ICD-10-CM | POA: Diagnosis not present

## 2017-12-25 MED ORDER — HYDROXYZINE HCL 25 MG PO TABS: 25.0000 mg | ORAL_TABLET | Freq: Three times a day (TID) | ORAL | 1 refills | Status: DC | PRN

## 2017-12-25 MED ORDER — GABAPENTIN 300 MG PO CAPS: 300.0000 mg | ORAL_CAPSULE | Freq: Two times a day (BID) | ORAL | 1 refills | Status: DC | PRN

## 2017-12-25 MED ORDER — METFORMIN HCL ER 500 MG PO TB24: ORAL_TABLET | ORAL | 2 refills | Status: DC

## 2017-12-25 NOTE — Telephone Encounter (Signed)
Will type letter and have RA sign it with his recommendation. Then will fax the letter back to [facility].   Nothing further needed.

## 2017-12-25 NOTE — Telephone Encounter (Signed)
Sent!

## 2017-12-25 NOTE — Telephone Encounter (Signed)
Ok Thx 

## 2017-12-26 ENCOUNTER — Encounter (HOSPITAL_COMMUNITY): Payer: Self-pay

## 2017-12-26 ENCOUNTER — Ambulatory Visit (HOSPITAL_COMMUNITY)
Admission: RE | Admit: 2017-12-26 | Discharge: 2017-12-26 | Disposition: A | Discharge status: Home / Self Care | Payer: Medicare Other | Source: Ambulatory Visit | Attending: Adult Health | Admitting: Adult Health

## 2017-12-26 DIAGNOSIS — R062 Wheezing: Secondary | ICD-10-CM | POA: Diagnosis not present

## 2017-12-26 DIAGNOSIS — J181 Lobar pneumonia, unspecified organism: Secondary | ICD-10-CM

## 2017-12-26 MED ORDER — SODIUM CHLORIDE (PF) 0.9 % IJ SOLN
INTRAMUSCULAR | Status: AC
  Filled 2017-12-26: qty 50

## 2017-12-26 MED ORDER — IOHEXOL 300 MG/ML  SOLN
75.0000 mL | Freq: Once | INTRAMUSCULAR | Status: AC | PRN
  Administered 2017-12-26: 75 mL via INTRAVENOUS

## 2017-12-29 ENCOUNTER — Encounter: Payer: Self-pay | Admitting: Adult Health

## 2017-12-29 ENCOUNTER — Ambulatory Visit (INDEPENDENT_AMBULATORY_CARE_PROVIDER_SITE_OTHER): Payer: Medicare Other | Admitting: Adult Health

## 2017-12-29 ENCOUNTER — Other Ambulatory Visit: Payer: Self-pay

## 2017-12-29 DIAGNOSIS — I2510 Atherosclerotic heart disease of native coronary artery without angina pectoris: Secondary | ICD-10-CM | POA: Diagnosis not present

## 2017-12-29 DIAGNOSIS — J9611 Chronic respiratory failure with hypoxia: Secondary | ICD-10-CM

## 2017-12-29 DIAGNOSIS — J441 Chronic obstructive pulmonary disease with (acute) exacerbation: Secondary | ICD-10-CM | POA: Diagnosis not present

## 2017-12-29 DIAGNOSIS — R918 Other nonspecific abnormal finding of lung field: Secondary | ICD-10-CM

## 2017-12-29 DIAGNOSIS — D869 Sarcoidosis, unspecified: Secondary | ICD-10-CM | POA: Diagnosis not present

## 2017-12-29 MED ORDER — METFORMIN HCL ER 500 MG PO TB24: ORAL_TABLET | ORAL | 2 refills | Status: DC

## 2017-12-29 NOTE — Telephone Encounter (Signed)
This was sent to [place] on 12/25/17 I will resend now but this may be a pharmacy issue

## 2017-12-29 NOTE — Assessment & Plan Note (Signed)
Stable on CT chest.  Since July 2018.  Will repeat CT chest in 6 months

## 2017-12-29 NOTE — Assessment & Plan Note (Signed)
Continue on oxygen 

## 2017-12-29 NOTE — Assessment & Plan Note (Signed)
Appears stable on CT chest.  Patient does have scattered nodularity.  We will plan on repeat CT chest in 6 months.  Check PFTs in 3 months.

## 2017-12-29 NOTE — Telephone Encounter (Signed)
The pharmacy states they have not received this RX. Please Advise, thanks

## 2017-12-29 NOTE — Progress Notes (Signed)
@Patient  ID: [name], female    DOB: 1960/04/20, 57 y.o.   MRN: [id number]  Chief Complaint  Patient presents with  . 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   LE Venous Doppler 08/18/15 >>neg DVT , no bakers cyst, ?prominent inguinal lymph node measuring 3.1cm   MRI brain neg [facility] = MRI spine = mild to moderate disc protrusion/left-sided spinal stenosis-chronic pain  NPSG 12/2004 mild, RDI 13/h    12/29/2017 Follow up : Sarcoid , bronchitis Patient returns for a 2-week follow-up.  Patient was seen last visit for a slow to resolve bronchitis./Pneumonia.  Patient initially started having symptoms 2 months ago.  She had been on 2 separate antibiotics including Levaquin and a Z-Pak.  Patient has underlying sarcoidosis on Humira due to reoccurring cutaneous lesions.  Last visit she was having Persistent cough congestion and wheezing.  He was started on Augmentin for 7 days along with a prednisone taper with 20 mg daily for 1 week and then 10 mg daily for 1 week.  Since last visit patient says she is starting to feel better.  In fact she feels a lot better.  She has decreased cough and congestion.  She remains on oxygen at 2 L. CT chest was done on December 27, 2017 that showed calcified mediastinal and hilar lymph nodes that are stable.  Diffuse micro-nodularity within both lungs, increased in the right upper lobe, left apical, posterior right upper lobe and anterior right lung base.  No new or progressive nodular densities, felt to be chronic changes of sarcoid stable since July 2018.  She is being followed by general surgery.  She had planned surgery next month for cholecystectomy.  She had a PERC drain to her gallbladder since August 2019. Patient is considered a moderate risk for laparoscopic abdominal surgery for postop pulmonary complications.  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  She would need to stop Xarelto for 2 days prior to surgery and resume on the evening of surgery if appropriate.. She is aware of these potential factors..    Allergies  Allergen Reactions  . Belsomra [Suvorexant] Other (See Comments)    [place] out of bed while asleep: "I'm waking up as I'm falling on the floor;" "Night terrors"  . Enalapril Maleate Cough  . Latex Hives  . Nickel Other (See Comments)    Blisters  Pt has a titanium right and left knee - nickel causes skin irritations that form into blisters and sores  . Other Other (See Comments)    Patient has Sarcoidosis and can't tolerate any metals  . Doxycycline Diarrhea and Nausea And Vomiting  . Hydrochlorothiazide Other (See Comments)    Low potassium levels   . Hydroxychloroquine Sulfate Other (See Comments)  Vision changes   . Lyrica [Pregabalin] Other (See Comments)    "Made me feel high"  . Tape Other (See Comments)    Medical tape causes bruising    Immunization History  Administered Date(s) Administered  . H1N1 12/24/2007  . Influenza Split 10/11/2010  . Influenza Whole 10/14/2007, 10/05/2008, 10/26/2009, 11/13/2011  . Influenza,inj,Quad PF,6+ Mos 09/21/2012, 09/24/2013, 10/06/2014, 09/05/2015, 11/06/2016, 09/24/2017  . PPD Test 11/18/2014  . Pneumococcal  Conjugate-13 07/30/2010, 12/29/2012  . Pneumococcal Polysaccharide-23 12/23/2013  . Tdap 06/28/2014    Past Medical History:  Diagnosis Date  . Allergic rhinitis   . ALLERGIC RHINITIS 10/26/2009  . Anxiety   . ANXIETY 08/14/2006  . B12 DEFICIENCY 08/25/2007  . Complication of anesthesia    pt has had difficulty following anesthesia with her knee in 2016-unable to care for herself afterward  . Confusion   . Depression    takes Lexapro daily  . Diabetes mellitus without complication ([place])    was on insulin but has been off since Nov 2015 and now only takes Metformin daily  . DYSPNEA 04/28/2009   with exertion  . Esophageal reflux    takes Nexium daily  . Fibromyalgia   . Headache    last migraine 2-50yrs ago;takes Topamax daily  . History of shingles   . Hypertension    takes Coreg daily  . Insomnia    takes Nortriptyline nightly   . Joint pain   . Joint swelling   . Left knee pain   . Lichen planus   . Long-term memory loss   . Nocturia   . OSA (obstructive sleep apnea)    doesn't use CPAP;sleep study in epic from 2006  . Osteoarthritis   . Osteoarthritis   . Pneumonia    over 30 yrs ago  . Protein calorie malnutrition ([place])   . Rheumatoid arthritis ([place])   . Sarcoidosis    Dr. Lolita Patella  . Short-term memory loss   . Shortness of breath   . TIA (transient ischemic attack)   . Unsteady gait   . Urinary urgency   . Vitamin D deficiency    is supposed to take Vit D but can't afford it  . VITAMIN D DEFICIENCY 08/25/2007    Tobacco History: Social History   Tobacco Use  Smoking Status Former Smoker  . Packs/day: 0.50  . Years: 10.00  . Pack years: 5.00  . Types: Cigarettes  Smokeless Tobacco Never Used  Tobacco Comment   quit smoking in 2004   Counseling given: Not Answered Comment: quit smoking in 2004   Outpatient Medications Prior to Visit  Medication Sig Dispense Refill  . acetaminophen (TYLENOL) 325 MG tablet Take 2 tablets (650 mg total) by mouth  every 6 (six) hours as needed for mild pain, fever or headache. 30 tablet   . Adalimumab (HUMIRA PEN-PS/UV/ADOL HS START) 40 MG/0.8ML PNKT Inject 40 mg into the skin once a week.    [name] augmented betamethasone dipropionate (DIPROLENE-AF) 0.05 % ointment Apply topically 2 (two) times daily.  8  . B-D ULTRA-FINE 33 LANCETS MISC Use as instructed to test sugars 4 times daily DX: E11.8 400 each 1  . budesonide-formoterol (SYMBICORT) 160-4.5 MCG/ACT inhaler Inhale 2 puffs into the lungs TWICE DAILY 120/4=30 10.2 g 0  . carvedilol (COREG) 12.5 MG tablet Take 1 tablet (12.5 mg total) by mouth 2 (two) times daily with a meal. 180 tablet 3  . clonazePAM (KLONOPIN) 0.5 MG tablet Take 1-2 tablets (0.5-1 mg total) by  mouth at bedtime. (Patient taking differently: Take 0.125 mg by mouth at bedtime. Pt takes 1/4 tablet=0.125 mg qhs) 60 tablet 3  . esomeprazole (NEXIUM) 40 MG capsule Take 1 capsule (40 mg total) by mouth daily before breakfast. 90 capsule 1  . furosemide (LASIX) 40 MG tablet TAKE 1 TABLET BY MOUTH EVERY MORNING FOR EDEMA (Patient taking differently: Take 40 mg by mouth daily as needed for fluid or edema. ) 90 tablet 1  . gabapentin (NEURONTIN) 300 MG capsule Take 1 capsule (300 mg total) by mouth 2 (two) times daily as needed (for pain). 180 capsule 1  . glucose blood (EASY TALK BLOOD GLUCOSE TEST) test strip Use as instructed 2x a day 200 each 12  . hydrOXYzine (ATARAX/VISTARIL) 25 MG tablet Take 1 tablet (25 mg total) by mouth every 8 (eight) hours as needed for itching. 60 tablet 1  . JANUVIA 50 MG tablet Take 1 tablet (50 mg total) by mouth daily. 90 tablet 0  . metFORMIN (GLUCOPHAGE-XR) 500 MG 24 hr tablet TAKE 2 TABLETS(1000 MG) BY MOUTH DAILY WITH BREAKFAST 180 tablet 2  . Probiotic Product (PROBIOTIC-10 PO) Take 1 capsule by mouth daily.    [name] topiramate (TOPAMAX) 50 MG tablet Take 1 tablet (50 mg total) by mouth 2 (two) times daily. 180 tablet 1  . triamcinolone cream (KENALOG) 0.1 % Apply 1  application topically 2 (two) times daily. 80 g 2  . venlafaxine XR (EFFEXOR-XR) 75 MG 24 hr capsule Take 1 capsule (75 mg total) by mouth daily with breakfast. 30 capsule 11  . VENTOLIN HFA 108 (90 Base) MCG/ACT inhaler Inhale 2 puffs into the lungs every 6 (six) hours as needed for wheezing or shortness of breath. 200/8=25 (Patient taking differently: Inhale 2 puffs into the lungs every 6 (six) hours as needed for wheezing or shortness of breath. ) 18 g 11  . VOLTAREN 1 % GEL Apply 2 g topically 2 (two) times daily as needed (for pain). 100/4=25 (Patient taking differently: Apply 4 g topically 4 (four) times daily as needed (pain). ) 100 g 2  . XARELTO 20 MG TABS tablet Take 1 tablet (20 mg total) by mouth at bedtime. 90 tablet 3  . levofloxacin (LEVAQUIN) 750 MG tablet Take 1 tablet (750 mg total) by mouth daily. X 7 days (Patient not taking: Reported on 12/29/2017) 7 tablet 0  . predniSONE (DELTASONE) 10 MG tablet 2 tabs daily for 1 week then 1 tab daily for 1 week and stop (Patient not taking: Reported on 12/29/2017) 21 tablet 0   No facility-administered medications prior to visit.      Review of Systems  Constitutional:   No  weight loss, night sweats,  Fevers, chills, + fatigue, or  lassitude.  HEENT:   No headaches,  Difficulty swallowing,  Tooth/dental problems, or  Sore throat,                No sneezing, itching, ear ache, nasal congestion, post nasal drip,   CV:  No chest pain,  Orthopnea, PND, swelling in lower extremities, anasarca, dizziness, palpitations, syncope.   GI  No heartburn, indigestion, abdominal pain, nausea, vomiting, diarrhea, change in bowel habits, loss of appetite, bloody stools.   Resp:  No chest wall deformity  Skin: no rash or lesions.  GU: no dysuria, change in color of urine, no urgency or frequency.  No flank pain, no hematuria   MS:  No joint pain or swelling.  No decreased range of motion.  No back pain.    Physical Exam  BP 122/74 (BP  Location: Left Arm, Cuff Size: Large)   Pulse 66   Ht 5\' 7"  (1.702 m)   Wt 253 lb (114.8 kg)   SpO2 97%   BMI 39.63 kg/m   GEN: A/Ox3; pleasant , NAD, obese, on oxygen   HEENT:  [employer]/AT,  EACs-clear, TMs-wnl, NOSE-clear, THROAT-clear, no lesions, no postnasal drip or exudate noted.   NECK:  Supple w/ fair ROM; no JVD; normal carotid impulses w/o bruits; no thyromegaly or nodules palpated; no lymphadenopathy.    RESP  Clear  P & A; w/o, wheezes/ rales/ or rhonchi. no accessory muscle use, no dullness to percussion  CARD:  RRR, no m/r/g, tr  peripheral edema, pulses intact, no cyanosis or clubbing.  GI:   Soft & nt; nml bowel sounds; no organomegaly or masses detected.   Musco: Warm bil, no deformities or joint swelling noted.   Neuro: alert, no focal deficits noted.    Skin: Warm, no lesions or rashes    Lab Results:  CBC  BNP    Component Value Date/Time   BNP 204.1 (H) 09/12/2017 1900    ProBNP    Component Value Date/Time   PROBNP 194.0 (H) 11/11/2016 1051    Imaging: Ct Chest W Contrast  Result Date: 12/27/2017 CLINICAL DATA:  History of sarcoid.  Wheezing. EXAM: CT CHEST WITH CONTRAST TECHNIQUE: Multidetector CT imaging of the chest was performed during intravenous contrast administration. CONTRAST:  65mL OMNIPAQUE IOHEXOL 300 MG/ML  SOLN COMPARISON:  CT chest 07/21/2016 FINDINGS: Cardiovascular: Heart size appears within normal limits. Aortic atherosclerosis. Calcification in the LAD and RCA coronary arteries noted. Mediastinum/Nodes: Normal appearance of the thyroid gland. The trachea appears patent and is midline. Normal appearance of the esophagus. No axillary or supraclavicular adenopathy. Calcified mediastinal and hilar lymph nodes are again identified. The appearance is stable when compared with previous exam. Lungs/Pleura: No pleural effusions. Diffuse perilymphatic micro nodularity within both lungs identified. More confluent nodular opacities are noted  bilaterally: -posterior right upper lobe nodular dense measures 1.1 x 3.1 cm, image 31/7. Previously 3.5 by 1.1 cm. -Left apical nodular density measures 2.1 x 0.9 cm, image 13/7. Previously 1.6 x 1.7 cm. -Anterior right lung base nodular density measures 1.2 x 0.9 cm, image 77/7. Previously 1.4 by 1.1 cm. No new or progressive nodular densities identified. Upper Abdomen: There is a right upper quadrant percutaneous cholecystostomy tube in place. The gallbladder appears collapsed. No acute findings identified. Right kidney cyst is noted. Musculoskeletal: Degenerative disc disease identified within the thoracic spine. IMPRESSION: 1. No acute cardiopulmonary abnormalities. 2. Chronic changes of sarcoid appears stable when compared with 07/21/2016. This includes peri lymphatic micro nodularity and patchy nodular airspace densities in both lungs. Unchanged appearance of bilateral calcified mediastinal and hilar adenopathy. 3. Right upper quadrant percutaneous cholecystostomy tube in satisfactory position. 4.  Aortic Atherosclerosis (ICD10-I70.0). 5. Coronary artery atherosclerotic calcifications. Electronically Signed   By: Kerby Moors M.D.   On: 12/27/2017 08:35    cyanocobalamin ((VITAMIN B-12)) injection 1,000 mcg    Date Action Dose Route User   12/08/2017 1617 Given 1000 mcg Intramuscular (Right Deltoid) Karren Cobble, CMA      PFT Results Latest Ref Rng & Units 12/30/2016 05/18/2014  FVC-Pre L 2.07 2.29  FVC-Predicted Pre % 54 61  FVC-Post L 2.18 2.43  FVC-Predicted Post % 57 65  Pre FEV1/FVC % % 70 69  Post FEV1/FCV % % 68 70  FEV1-Pre  L 1.46 1.58  FEV1-Predicted Pre % 49 54  FEV1-Post L 1.49 1.70  DLCO UNC% % 41 62  DLCO COR %Predicted % 66 91  TLC L 4.07 -  TLC % Predicted % 74 -  RV % Predicted % 92 -    No results found for: NITRICOXIDE      Assessment & Plan:   Sarcoidosis Appears stable on CT chest.  Patient does have scattered nodularity.  We will plan on repeat CT  chest in 6 months.  Check PFTs in 3 months.  Pulmonary nodules Stable on CT chest.  Since July 2018.  Will repeat CT chest in 6 months  COPD (chronic obstructive pulmonary disease) (HCC) Recent slow to resolve flare with bronchitis now improving.  Plan  Patient Instructions  Continue on Symbicort Twice daily  . Rinse after use.  CT chest in 6 months .  Finish prednisone taper and stop.  Mucinex DM Twice daily  As needed  Cough/congestion  Continue on Oxygen 2l/m .  Continue on Xarelto .   Follow up Dr. Elsworth Soho in 3 months and As needed  With PFTs. (Spirometry w/ DLCO )  Please contact office for sooner follow up if symptoms do not improve or worsen or seek emergency care       Chronic respiratory failure with hypoxia ([place]) Continue on oxygen     Rexene Edison, NP 12/29/2017

## 2017-12-29 NOTE — Patient Instructions (Signed)
Continue on Symbicort Twice daily  . Rinse after use.  CT chest in 6 months .  Finish prednisone taper and stop.  Mucinex DM Twice daily  As needed  Cough/congestion  Continue on Oxygen 2l/m .  Continue on Xarelto .   Follow up Dr. Elsworth Soho in 3 months and As needed  With PFTs. (Spirometry w/ DLCO )  Please contact office for sooner follow up if symptoms do not improve or worsen or seek emergency care

## 2017-12-29 NOTE — Assessment & Plan Note (Signed)
Recent slow to resolve flare with bronchitis now improving.  Plan  Patient Instructions  Continue on Symbicort Twice daily  . Rinse after use.  CT chest in 6 months .  Finish prednisone taper and stop.  Mucinex DM Twice daily  As needed  Cough/congestion  Continue on Oxygen 2l/m .  Continue on Xarelto .   Follow up Dr. Elsworth Soho in 3 months and As needed  With PFTs. (Spirometry w/ DLCO )  Please contact office for sooner follow up if symptoms do not improve or worsen or seek emergency care

## 2017-12-30 ENCOUNTER — Other Ambulatory Visit: Payer: Self-pay | Admitting: Internal Medicine

## 2017-12-30 DIAGNOSIS — K819 Cholecystitis, unspecified: Secondary | ICD-10-CM | POA: Diagnosis not present

## 2017-12-30 DIAGNOSIS — I130 Hypertensive heart and chronic kidney disease with heart failure and stage 1 through stage 4 chronic kidney disease, or unspecified chronic kidney disease: Secondary | ICD-10-CM | POA: Diagnosis not present

## 2017-12-30 DIAGNOSIS — I5030 Unspecified diastolic (congestive) heart failure: Secondary | ICD-10-CM | POA: Diagnosis not present

## 2017-12-30 DIAGNOSIS — Z434 Encounter for attention to other artificial openings of digestive tract: Secondary | ICD-10-CM | POA: Diagnosis not present

## 2017-12-30 DIAGNOSIS — J9611 Chronic respiratory failure with hypoxia: Secondary | ICD-10-CM | POA: Diagnosis not present

## 2017-12-30 DIAGNOSIS — J449 Chronic obstructive pulmonary disease, unspecified: Secondary | ICD-10-CM | POA: Diagnosis not present

## 2017-12-30 MED ORDER — METFORMIN HCL ER 500 MG PO TB24: ORAL_TABLET | ORAL | 2 refills | Status: DC

## 2017-12-30 NOTE — Telephone Encounter (Signed)
RX was ordered however it was marked "no print" so it never went to pharmacy.  New RX sent.

## 2017-12-30 NOTE — Telephone Encounter (Signed)
MEDICATION:   Disp Refills Start End   metFORMIN (GLUCOPHAGE-XR) 500 MG 24 hr           PHARMACY:  [facility] [place] : yes  IS PATIENT OUT OF MEDICATION: yes-been out since last week  IF NOT; HOW MUCH IS LEFT: none  LAST APPOINTMENT DATE: @12 /16/2019  NEXT APPOINTMENT DATE:@2 /18/2020  DO WE HAVE YOUR PERMISSION TO LEAVE A DETAILED MESSAGE: yes  OTHER COMMENTS: Pharmacy had issues receiving RX's-please resend RX for the above medication   **Let patient know to contact pharmacy at the end of the day to make sure medication is ready. **  ** Please notify patient to allow 48-72 hours to process**  **Encourage patient to contact the pharmacy for refills or they can request refills through [facility]**

## 2018-01-01 ENCOUNTER — Inpatient Hospital Stay: Admission: RE | Admit: 2018-01-01 | Payer: Medicare Other | Source: Ambulatory Visit

## 2018-01-05 ENCOUNTER — Other Ambulatory Visit: Payer: Self-pay | Admitting: *Deleted

## 2018-01-05 NOTE — Patient Outreach (Addendum)
[facility]) Care Management  01/05/2018  [place] 11-May-1960 [id number]   Call placed to member to follow up on current health status.  No answer, HIPAA compliant voice message left.  Will follow up within the next 4 business days.      Update @ 1630:  Call received back from member. She state she is doing well, recently experiencing depression related to her son. Report she was having some financial hardship because she was helping him out.  State she has learned her lesson and will now focus on caring for herself.  Report she has now been cleared by pulmonology to have her gall bladder removed, will have surgery scheduled the first of the year.  She denies any urgent concerns, will contact this care manger once she is aware of surgery date.  Will follow up within the next month.   Valente David, [place], MSN [place] [phone]

## 2018-01-08 DIAGNOSIS — J9611 Chronic respiratory failure with hypoxia: Secondary | ICD-10-CM | POA: Diagnosis not present

## 2018-01-08 DIAGNOSIS — K819 Cholecystitis, unspecified: Secondary | ICD-10-CM | POA: Diagnosis not present

## 2018-01-08 DIAGNOSIS — Z434 Encounter for attention to other artificial openings of digestive tract: Secondary | ICD-10-CM | POA: Diagnosis not present

## 2018-01-08 DIAGNOSIS — J449 Chronic obstructive pulmonary disease, unspecified: Secondary | ICD-10-CM | POA: Diagnosis not present

## 2018-01-08 DIAGNOSIS — I5030 Unspecified diastolic (congestive) heart failure: Secondary | ICD-10-CM | POA: Diagnosis not present

## 2018-01-08 DIAGNOSIS — I130 Hypertensive heart and chronic kidney disease with heart failure and stage 1 through stage 4 chronic kidney disease, or unspecified chronic kidney disease: Secondary | ICD-10-CM | POA: Diagnosis not present

## 2018-01-09 ENCOUNTER — Telehealth: Payer: Self-pay | Admitting: Adult Health

## 2018-01-09 ENCOUNTER — Ambulatory Visit: Payer: Self-pay | Admitting: *Deleted

## 2018-01-09 MED ORDER — PREDNISONE 10 MG PO TABS: ORAL_TABLET | ORAL | 0 refills | Status: DC

## 2018-01-09 NOTE — Telephone Encounter (Signed)
Pt is aware of below recommendations and voiced his understanding. Rx for prednisone has been sent to preferred pharmacy.  Pt has been scheduled for ov with Aaron Edelman on 01/15/17 at 2:45p. Nothing further is needed.

## 2018-01-09 NOTE — Telephone Encounter (Signed)
Called and spoke to pt.  Pt reports of nasal congestion & nasal drainage green in color. Pt stated sx started on 01/07/18 Pt denied cough, wheezing or f/c/s. Sob with is baseline.  Pt stated she completed prednisone taper last week that was prescribed by TP on 12/29/17. Pt states cough subsided with prednisone, however she has since developed nasal congestion and nasal drainge.  Pt declined appt due to transportation.   Aaron Edelman please advise. Thanks.

## 2018-01-09 NOTE — Telephone Encounter (Signed)
Sorry to hear the patient is not feeling well.Okay to offer: Prednisone 10mg  tablet  >>>Take 2 tablets (20 mg total) daily for the next 5 days >>> Take with food in the morning  Please place the order.Patient needs follow-up office visit with TP or another APP next week for further evaluation if symptoms are slow to improve.  Wyn Quaker, NP

## 2018-01-14 NOTE — Progress Notes (Addendum)
@Patient  ID: [name], female    DOB: [birth year], 58 y.o.   MRN: [id number]  Chief Complaint  Patient presents with  . 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 5 pack years.  Maintenance: Symbicort 160 Pt of: Dr. Elsworth Soho   01/15/2018  - Visit   58 year old female presenting today for acute visit.  Patient reports increased nasal congestion head stuffiness.  Patient was previously treated with Augmentin in December/2019 has had 2 rounds of prednisone.  Patient reports that the prednisone did not help.  Patient reports no known sick contacts.  Patient presents today on 2 L pulsed O2.  Patient has planned surgery in January/2019, patient is quite anxious to remain healthy to be able to complete this surgery.  Tests:   PFT 05/2014 >> FVC 65%, ratio 69, FEV1 58%, DLCO 62%  VQ Scan 08/17/15 >>LLL &RUL acute PE   ECHO 08/18/15 >>mild LVH , EF 55-60%, Gr 1 DD .   LE Venous Doppler 08/18/15 >>neg DVT , no bakers cyst, ?prominent inguinal lymph node measuring 3.1cm   MRI brain neg [facility] = MRI spine = mild to moderate disc protrusion/left-sided spinal stenosis-chronic pain  NPSG 12/2004 mild, RDI 13/h  12/26/2017-CT chest with contrast- no acute cardiopulmonary abnormalities, chronic changes of sarcoid appears stable Candida 12/18/2017-respiratory sputum culture showing yeast  12/27/2017-CT chest with contrast- no acute cardiopulmonary abnormalities, chronic changes of sarcoid appears stable, right upper  quadrantpercutaneous cutaneous cholecystectomy tube in position  FENO:  No results found for: NITRICOXIDE  PFT: PFT Results Latest Ref Rng & Units 12/30/2016 05/18/2014  FVC-Pre L 2.07 2.29  FVC-Predicted Pre % 54 61  FVC-Post L 2.18 2.43  FVC-Predicted Post % 57 65  Pre FEV1/FVC % % 70 69  Post FEV1/FCV % % 68 70  FEV1-Pre L 1.46 1.58  FEV1-Predicted Pre % 49 54  FEV1-Post L 1.49 1.70  DLCO UNC% % 41 62  DLCO COR %Predicted % 66 91  TLC L 4.07 -  TLC % Predicted % 74 -  RV % Predicted % 92 -    Imaging: Ct Chest W Contrast  Result Date: 12/27/2017 CLINICAL DATA:  History of sarcoid.  Wheezing. EXAM: CT CHEST WITH CONTRAST TECHNIQUE: Multidetector CT imaging of the chest was performed during intravenous contrast administration. CONTRAST:  89mL OMNIPAQUE IOHEXOL 300 MG/ML  SOLN COMPARISON:  CT chest 07/21/2016 FINDINGS: Cardiovascular: Heart size appears within normal limits. Aortic atherosclerosis. Calcification in the LAD and RCA coronary arteries noted. Mediastinum/Nodes: Normal appearance of the thyroid gland. The trachea appears patent and is midline. Normal appearance of the esophagus. No axillary or supraclavicular adenopathy. Calcified mediastinal and hilar lymph nodes are again identified. The appearance is stable when compared with previous exam. Lungs/Pleura: No pleural effusions. Diffuse perilymphatic micro nodularity within both lungs identified. More confluent nodular opacities are noted bilaterally: -posterior right upper lobe nodular dense measures 1.1 x 3.1 cm, image 31/7. Previously 3.5 by 1.1 cm. -Left apical nodular density measures 2.1 x 0.9 cm, image 13/7. Previously 1.6 x 1.7 cm. -Anterior right lung base nodular density  measures 1.2 x 0.9 cm, image 77/7. Previously 1.4 by 1.1 cm. No new or progressive nodular densities identified. Upper Abdomen: There is a right upper quadrant percutaneous cholecystostomy tube in place. The gallbladder appears collapsed. No acute  findings identified. Right kidney cyst is noted. Musculoskeletal: Degenerative disc disease identified within the thoracic spine. IMPRESSION: 1. No acute cardiopulmonary abnormalities. 2. Chronic changes of sarcoid appears stable when compared with 07/21/2016. This includes peri lymphatic micro nodularity and patchy nodular airspace densities in both lungs. Unchanged appearance of bilateral calcified mediastinal and hilar adenopathy. 3. Right upper quadrant percutaneous cholecystostomy tube in satisfactory position. 4.  Aortic Atherosclerosis (ICD10-I70.0). 5. Coronary artery atherosclerotic calcifications. Electronically Signed   By: Kerby Moors M.D.   On: 12/27/2017 08:35      Specialty Problems      Pulmonary Problems   Allergic rhinitis    Chronic        Pulmonary nodules    CT chest 2014      Chronic respiratory failure with hypoxia ([place])    7/18 on O2 2 l/min [place]      OSA (obstructive sleep apnea)    Mild, RDI 13/h - 2006      COPD (chronic obstructive pulmonary disease) (HCC)   URI (upper respiratory infection)   Nasal sinus congestion      Allergies  Allergen Reactions  . Belsomra [Suvorexant] Other (See Comments)    [place] out of bed while asleep: "I'm waking up as I'm falling on the floor;" "Night terrors"  . Enalapril Maleate Cough  . Latex Hives  . Nickel Other (See Comments)    Blisters  Pt has a titanium right and left knee - nickel causes skin irritations that form into blisters and sores  . Other Other (See Comments)    Patient has Sarcoidosis and can't tolerate any metals  . Doxycycline Diarrhea and Nausea And Vomiting  . Hydrochlorothiazide Other (See Comments)    Low potassium levels   . Hydroxychloroquine Sulfate Other (See Comments)    Vision changes   . Lyrica [Pregabalin] Other (See Comments)    "Made me feel high"  . Tape Other (See Comments)    Medical tape causes bruising    Immunization History  Administered Date(s) Administered  .  H1N1 12/24/2007  . Influenza Split 10/11/2010  . Influenza Whole 10/14/2007, 10/05/2008, 10/26/2009, 11/13/2011  . Influenza,inj,Quad PF,6+ Mos 09/21/2012, 09/24/2013, 10/06/2014, 09/05/2015, 11/06/2016, 09/24/2017  . PPD Test 11/18/2014  . Pneumococcal Conjugate-13 07/30/2010, 12/29/2012  . Pneumococcal Polysaccharide-23 12/23/2013  . Tdap 06/28/2014    Past Medical History:  Diagnosis Date  . Allergic rhinitis   . ALLERGIC RHINITIS 10/26/2009  . Anxiety   . ANXIETY 08/14/2006  . B12 DEFICIENCY 08/25/2007  . Complication of anesthesia    pt has had difficulty following anesthesia with her knee in 2016-unable to care for herself afterward  . Confusion   . Depression    takes Lexapro daily  . Diabetes mellitus without complication ([place])    was on insulin but has been off since Nov 2015 and now only takes Metformin daily  . DYSPNEA 04/28/2009   with exertion  . Esophageal reflux    takes Nexium daily  . Fibromyalgia   . Headache    last migraine 2-66yrs ago;takes Topamax daily  . History of shingles   . Hypertension    takes Coreg daily  . Insomnia    takes Nortriptyline nightly   . Joint pain   . Joint swelling   .  Left knee pain   . Lichen planus   . Long-term memory loss   . Nocturia   . OSA (obstructive sleep apnea)    doesn't use CPAP;sleep study in epic from 2006  . Osteoarthritis   . Osteoarthritis   . Pneumonia    over 30 yrs ago  . Protein calorie malnutrition ([place])   . Rheumatoid arthritis ([place])   . Sarcoidosis    Dr. Lolita Patella  . Short-term memory loss   . Shortness of breath   . TIA (transient ischemic attack)   . Unsteady gait   . Urinary urgency   . Vitamin D deficiency    is supposed to take Vit D but can't afford it  . VITAMIN D DEFICIENCY 08/25/2007    Tobacco History: Social History   Tobacco Use  Smoking Status Former Smoker  . Packs/day: 0.50  . Years: 10.00  . Pack years: 5.00  . Types: Cigarettes  Smokeless Tobacco Never Used    Tobacco Comment   quit smoking in 2004   Counseling given: Not Answered Comment: quit smoking in 2004  Continue to not smoke  Outpatient Encounter Medications as of 01/15/2018  Medication Sig  . acetaminophen (TYLENOL) 325 MG tablet Take 2 tablets (650 mg total) by mouth every 6 (six) hours as needed for mild pain, fever or headache.  . Adalimumab (HUMIRA PEN-PS/UV/ADOL HS START) 40 MG/0.8ML PNKT Inject 40 mg into the skin once a week.  [name] augmented betamethasone dipropionate (DIPROLENE-AF) 0.05 % ointment Apply topically 2 (two) times daily.  . B-D ULTRA-FINE 33 LANCETS MISC Use as instructed to test sugars 4 times daily DX: E11.8  . budesonide-formoterol (SYMBICORT) 160-4.5 MCG/ACT inhaler Inhale 2 puffs into the lungs TWICE DAILY 120/4=30  . carvedilol (COREG) 12.5 MG tablet Take 1 tablet (12.5 mg total) by mouth 2 (two) times daily with a meal.  . clonazePAM (KLONOPIN) 0.5 MG tablet Take 1-2 tablets (0.5-1 mg total) by mouth at bedtime. (Patient taking differently: Take 0.125 mg by mouth at bedtime. Pt takes 1/4 tablet=0.125 mg qhs)  . esomeprazole (NEXIUM) 40 MG capsule Take 1 capsule (40 mg total) by mouth daily before breakfast.  . furosemide (LASIX) 40 MG tablet TAKE 1 TABLET BY MOUTH EVERY MORNING FOR EDEMA (Patient taking differently: Take 40 mg by mouth daily as needed for fluid or edema. )  . gabapentin (NEURONTIN) 300 MG capsule Take 1 capsule (300 mg total) by mouth 2 (two) times daily as needed (for pain).  [name] glucose blood (EASY TALK BLOOD GLUCOSE TEST) test strip Use as instructed 2x a day  . hydrOXYzine (ATARAX/VISTARIL) 25 MG tablet Take 1 tablet (25 mg total) by mouth every 8 (eight) hours as needed for itching.  [name] JANUVIA 50 MG tablet Take 1 tablet (50 mg total) by mouth daily.  . metFORMIN (GLUCOPHAGE-XR) 500 MG 24 hr tablet TAKE 2 TABLETS(1000 MG) BY MOUTH DAILY WITH BREAKFAST  . Probiotic Product (PROBIOTIC-10 PO) Take 1 capsule by mouth daily.  [name] topiramate (TOPAMAX)  50 MG tablet Take 1 tablet (50 mg total) by mouth 2 (two) times daily.  [name] triamcinolone cream (KENALOG) 0.1 % Apply 1 application topically 2 (two) times daily.  [name] venlafaxine XR (EFFEXOR-XR) 75 MG 24 hr capsule Take 1 capsule (75 mg total) by mouth daily with breakfast.  . VENTOLIN HFA 108 (90 Base) MCG/ACT inhaler Inhale 2 puffs into the lungs every 6 (six) hours as needed for wheezing or shortness of breath. 200/8=25 (Patient taking differently: Inhale 2  puffs into the lungs every 6 (six) hours as needed for wheezing or shortness of breath. )  . VOLTAREN 1 % GEL Apply 2 g topically 2 (two) times daily as needed (for pain). 100/4=25 (Patient taking differently: Apply 4 g topically 4 (four) times daily as needed (pain). )  . XARELTO 20 MG TABS tablet Take 1 tablet (20 mg total) by mouth at bedtime.  . [DISCONTINUED] predniSONE (DELTASONE) 10 MG tablet Take 2 tabs (20mg ) daily for 5 days  . nystatin (MYCOSTATIN) 100000 UNIT/ML suspension Take 5 mLs (500,000 Units total) by mouth 4 (four) times daily.   No facility-administered encounter medications on file as of 01/15/2018.      Review of Systems  Review of Systems  Constitutional: Positive for activity change (since august / 2019) and fatigue. Negative for appetite change, chills and fever.  HENT: Positive for congestion, hearing loss (muffled hearing ), sinus pressure and sinus pain. Negative for postnasal drip.   Eyes: Positive for redness.  Respiratory: Positive for shortness of breath (with exertion ) and wheezing (when off oxygen ). Negative for cough.   Cardiovascular: Negative for chest pain and palpitations.  Gastrointestinal: Negative for diarrhea, nausea and vomiting.  Genitourinary: Negative for hematuria.  Skin:       +itching - managed with atarax  Psychiatric/Behavioral: Positive for sleep disturbance (trouble sleeping ). The patient is nervous/anxious.      Physical Exam  BP 132/72 (BP Location: Left Arm, Cuff Size:  Normal)   Pulse 68   Temp 98 F (36.7 C) (Oral)   Ht 5\' 7"  (1.702 m)   Wt 260 lb 9.6 oz (118.2 kg)   SpO2 98%   BMI 40.82 kg/m   Wt Readings from Last 5 Encounters:  01/15/18 260 lb 9.6 oz (118.2 kg)  12/29/17 253 lb (114.8 kg)  12/18/17 254 lb 6.4 oz (115.4 kg)  12/08/17 252 lb (114.3 kg)  11/11/17 249 lb (112.9 kg)    Physical Exam  Constitutional: She is oriented to person, place, and time and well-developed, well-nourished, and in no distress. No distress.  HENT:  Head: Normocephalic and atraumatic.  Right Ear: Hearing, external ear and ear canal normal.  Left Ear: Hearing, external ear and ear canal normal.  Nose: Mucosal edema and rhinorrhea present. Right sinus exhibits maxillary sinus tenderness and frontal sinus tenderness. Left sinus exhibits maxillary sinus tenderness and frontal sinus tenderness.  Mouth/Throat: Uvula is midline and oropharynx is clear and moist. No oropharyngeal exudate.    + TMs with effusion without infection bilaterally + Mucosal edema, healing nasal mucosa, mucus in nasal passages + Postnasal drip  Eyes: Pupils are equal, round, and reactive to light.  Neck: Normal range of motion. Neck supple. No JVD present.  Cardiovascular: Normal rate, regular rhythm and normal heart sounds.  Pulmonary/Chest: Effort normal and breath sounds normal. No accessory muscle usage. No respiratory distress. She has no decreased breath sounds. She has no wheezes. She has no rhonchi.  Abdominal: Soft. Bowel sounds are normal. There is no abdominal tenderness.  Musculoskeletal: Normal range of motion.        General: No edema.  Lymphadenopathy:    She has no cervical adenopathy.  Neurological: She is alert and oriented to person, place, and time. Gait normal.  Skin: Skin is warm and dry. She is not diaphoretic. No erythema.  Psychiatric: Mood, memory, affect and judgment normal.  Nursing note and vitals reviewed.     Lab Results:  CBC    Component  Value  Date/Time   WBC 8.1 12/08/2017 1615   RBC 4.11 12/08/2017 1615   HGB 11.9 (L) 12/08/2017 1615   HGB 13.4 12/06/2013 0930   HCT 36.6 12/08/2017 1615   HCT 43.0 12/06/2013 0930   PLT 229.0 12/08/2017 1615   PLT 216 12/06/2013 0930   MCV 89.0 12/08/2017 1615   MCV 92.1 12/06/2013 0930   MCH 28.2 11/11/2017 1609   MCHC 32.6 12/08/2017 1615   RDW 16.4 (H) 12/08/2017 1615   RDW 14.5 12/06/2013 0930   LYMPHSABS 3.3 12/08/2017 1615   LYMPHSABS 1.7 12/06/2013 0930   MONOABS 0.8 12/08/2017 1615   MONOABS 0.6 12/06/2013 0930   EOSABS 0.5 12/08/2017 1615   EOSABS 0.1 12/06/2013 0930   BASOSABS 0.0 12/08/2017 1615   BASOSABS 0.0 12/06/2013 0930    BMET    Component Value Date/Time   NA 139 12/08/2017 1615   NA 144 07/19/2015   NA 140 12/06/2013 0930   K 3.8 12/08/2017 1615   K 3.7 12/06/2013 0930   CL 105 12/08/2017 1615   CO2 26 12/08/2017 1615   CO2 24 12/06/2013 0930   GLUCOSE 126 (H) 12/08/2017 1615   GLUCOSE 110 12/06/2013 0930   BUN 17 12/08/2017 1615   BUN 12 07/19/2015   BUN 11.0 12/06/2013 0930   CREATININE 0.88 12/08/2017 1615   CREATININE 1.1 12/06/2013 0930   CALCIUM 9.3 12/08/2017 1615   CALCIUM 9.4 12/06/2013 0930   GFRNONAA 58 (L) 11/11/2017 1609   GFRAA >60 11/11/2017 1609    BNP    Component Value Date/Time   BNP 204.1 (H) 09/12/2017 1900    ProBNP    Component Value Date/Time   PROBNP 194.0 (H) 11/11/2016 1051      Assessment & Plan:   58 year old female completing acute visit with our office today.  Believe patient has resolving upper respiratory infection.  Patient does have increased nasal congestion.  Patient to start Flonase and nasal saline rinses.  I do not believe the patient needs additional prednisone or more antibiotics at this time.  If symptoms persist into next week we could consider antibiotic therapy.  This would need to be a fluoroquinolone most likely this patient is already had a week of Augmentin and patient has allergy to  doxycycline.  As I discussed with the patient today would like to hold off on additional antibiotics as patient is afebrile and really just is persistently reporting nasal congestion.  Follow-up with Dr. Elsworth Soho in 6 to 8 weeks.  Chronic respiratory failure with hypoxia (HCC)  Continue oxygen therapy as prescribed - 2L  >>>maintain oxygen saturations greater than 88 percent  >>>if unable to maintain oxygen saturations please contact the office  >>>do not smoke with oxygen  >>>can use nasal saline gel or nasal saline rinses to moisturize nose if oxygen causes dryness  Okay to proceed forward with surgery  Follow-up with our office if symptoms are not improving  Follow-up with Dr. Elsworth Soho in 6 to 8 weeks   COPD (chronic obstructive pulmonary disease) ([place]) Start Flonase nasal spray 1 spray each nostril daily  Start nasal saline rinses 2 times a day  Continue Symbicort 160 >>> 2 puffs in the morning right when you wake up, rinse out your mouth after use, 12 hours later 2 puffs, rinse after use >>> Take this daily, no matter what >>> This is not a rescue inhaler   Please start taking a daily antihistamine:  >>>choose one of: zyrtec, claritin, allegra, or xyzal  >>>  these are over the counter medications  >>>can choose generic option  >>>take daily  >>>this medication helps with allergies, post nasal drip, and cough  >>> Please start a daily antihistamine whenever you stop taking your hydroxyzine  Okay to proceed forward with surgery  Follow-up with our office if symptoms are not improving  Follow-up with Dr. Elsworth Soho in 6 to 8 weeks   DVT (deep venous thrombosis) ([place]) Continue Xarelto  Pulmonary embolism ([place]) Continue Xarelto  Sarcoidosis Stable on last CT Continue follow-up with our office  Pulmonary nodules Stable on last CT Follow-up CT in 6 months tentatively June/2020  Thrush, oral Nystatin rinse  Nasal sinus congestion Start nasal saline rinses Start  Flonase Hold off on antibiotic therapy at this time  Hold off on additional prednisone at this time      Lauraine Rinne, NP 01/15/2018   This appointment was 34 minutes along with over 50% of the time in direct face-to-face patient care, assessment, plan of care, and follow-up.

## 2018-01-15 ENCOUNTER — Encounter: Payer: Self-pay | Admitting: Pulmonary Disease

## 2018-01-15 ENCOUNTER — Ambulatory Visit (INDEPENDENT_AMBULATORY_CARE_PROVIDER_SITE_OTHER): Payer: Medicare Other | Admitting: Pulmonary Disease

## 2018-01-15 VITALS — BP 132/72 | HR 68 | Temp 98.0°F | Ht 67.0 in | Wt 260.6 lb

## 2018-01-15 DIAGNOSIS — R0981 Nasal congestion: Secondary | ICD-10-CM | POA: Diagnosis not present

## 2018-01-15 DIAGNOSIS — I2782 Chronic pulmonary embolism: Secondary | ICD-10-CM

## 2018-01-15 DIAGNOSIS — I825Y1 Chronic embolism and thrombosis of unspecified deep veins of right proximal lower extremity: Secondary | ICD-10-CM | POA: Diagnosis not present

## 2018-01-15 DIAGNOSIS — J9611 Chronic respiratory failure with hypoxia: Secondary | ICD-10-CM | POA: Diagnosis not present

## 2018-01-15 DIAGNOSIS — B370 Candidal stomatitis: Secondary | ICD-10-CM | POA: Diagnosis not present

## 2018-01-15 DIAGNOSIS — J441 Chronic obstructive pulmonary disease with (acute) exacerbation: Secondary | ICD-10-CM | POA: Diagnosis not present

## 2018-01-15 DIAGNOSIS — R918 Other nonspecific abnormal finding of lung field: Secondary | ICD-10-CM | POA: Diagnosis not present

## 2018-01-15 DIAGNOSIS — D869 Sarcoidosis, unspecified: Secondary | ICD-10-CM

## 2018-01-15 MED ORDER — NYSTATIN 100000 UNIT/ML MT SUSP: 5.0000 mL | Freq: Four times a day (QID) | OROMUCOSAL | 0 refills | Status: DC

## 2018-01-15 NOTE — Assessment & Plan Note (Signed)
Stable on last CT Continue follow-up with our office

## 2018-01-15 NOTE — Assessment & Plan Note (Signed)
Start nasal saline rinses Start Flonase Hold off on antibiotic therapy at this time  Hold off on additional prednisone at this time

## 2018-01-15 NOTE — Assessment & Plan Note (Signed)
Continue Xarelto 

## 2018-01-15 NOTE — Patient Instructions (Addendum)
Walk in office today on room air  Start Flonase nasal spray 1 spray each nostril daily  Start nasal saline rinses 2 times a day  Continue Symbicort 160 >>> 2 puffs in the morning right when you wake up, rinse out your mouth after use, 12 hours later 2 puffs, rinse after use >>> Take this daily, no matter what >>> This is not a rescue inhaler   Continue oxygen therapy as prescribed - 2L  >>>maintain oxygen saturations greater than 88 percent  >>>if unable to maintain oxygen saturations please contact the office  >>>do not smoke with oxygen  >>>can use nasal saline gel or nasal saline rinses to moisturize nose if oxygen causes dryness  Please start taking a daily antihistamine:  >>>choose one of: zyrtec, claritin, allegra, or xyzal  >>>these are over the counter medications  >>>can choose generic option  >>>take daily  >>>this medication helps with allergies, post nasal drip, and cough  >>> Please start a daily antihistamine whenever you stop taking your hydroxyzine  Okay to proceed forward with surgery  Follow-up with our office if symptoms are not improving  Follow-up with Dr. Elsworth Soho in 6 to 8 weeks  It is flu season:   >>>Remember to be washing your hands regularly, using hand sanitizer, be careful to use around herself with has contact with people who are sick will increase her chances of getting sick yourself. >>> Best ways to protect herself from the flu: Receive the yearly flu vaccine, practice good hand hygiene washing with soap and also using hand sanitizer when available, eat a nutritious meals, get adequate rest, hydrate appropriately   Please contact the office if your symptoms worsen or you have concerns that you are not improving.   Thank you for choosing  [facility] for your healthcare, and for allowing [place] to partner with you on your healthcare journey. I am thankful to be able to provide care to you today.   Wyn Quaker FNP-C

## 2018-01-15 NOTE — Assessment & Plan Note (Signed)
Start Flonase nasal spray 1 spray each nostril daily  Start nasal saline rinses 2 times a day  Continue Symbicort 160 >>> 2 puffs in the morning right when you wake up, rinse out your mouth after use, 12 hours later 2 puffs, rinse after use >>> Take this daily, no matter what >>> This is not a rescue inhaler   Please start taking a daily antihistamine:  >>>choose one of: zyrtec, claritin, allegra, or xyzal  >>>these are over the counter medications  >>>can choose generic option  >>>take daily  >>>this medication helps with allergies, post nasal drip, and cough  >>> Please start a daily antihistamine whenever you stop taking your hydroxyzine  Okay to proceed forward with surgery  Follow-up with our office if symptoms are not improving  Follow-up with Dr. Elsworth Soho in 6 to 8 weeks

## 2018-01-15 NOTE — Assessment & Plan Note (Signed)
  Continue oxygen therapy as prescribed - 2L  >>>maintain oxygen saturations greater than 88 percent  >>>if unable to maintain oxygen saturations please contact the office  >>>do not smoke with oxygen  >>>can use nasal saline gel or nasal saline rinses to moisturize nose if oxygen causes dryness  Okay to proceed forward with surgery  Follow-up with our office if symptoms are not improving  Follow-up with Dr. Elsworth Soho in 6 to 8 weeks

## 2018-01-15 NOTE — Assessment & Plan Note (Signed)
Stable on last CT Follow-up CT in 6 months tentatively June/2020

## 2018-01-15 NOTE — Assessment & Plan Note (Signed)
Nystatin rinse

## 2018-01-16 ENCOUNTER — Ambulatory Visit (HOSPITAL_COMMUNITY)
Admission: RE | Admit: 2018-01-16 | Discharge: 2018-01-16 | Disposition: A | Discharge status: Home / Self Care | Payer: Medicare Other | Source: Ambulatory Visit | Attending: Interventional Radiology | Admitting: Interventional Radiology

## 2018-01-16 ENCOUNTER — Encounter (HOSPITAL_COMMUNITY): Payer: Self-pay | Admitting: Interventional Radiology

## 2018-01-16 DIAGNOSIS — I130 Hypertensive heart and chronic kidney disease with heart failure and stage 1 through stage 4 chronic kidney disease, or unspecified chronic kidney disease: Secondary | ICD-10-CM | POA: Diagnosis not present

## 2018-01-16 DIAGNOSIS — I5030 Unspecified diastolic (congestive) heart failure: Secondary | ICD-10-CM | POA: Diagnosis not present

## 2018-01-16 DIAGNOSIS — Z434 Encounter for attention to other artificial openings of digestive tract: Secondary | ICD-10-CM | POA: Insufficient documentation

## 2018-01-16 DIAGNOSIS — J9611 Chronic respiratory failure with hypoxia: Secondary | ICD-10-CM | POA: Diagnosis not present

## 2018-01-16 DIAGNOSIS — K819 Cholecystitis, unspecified: Secondary | ICD-10-CM

## 2018-01-16 DIAGNOSIS — J449 Chronic obstructive pulmonary disease, unspecified: Secondary | ICD-10-CM | POA: Diagnosis not present

## 2018-01-16 HISTORY — PX: IR CHOLANGIOGRAM EXISTING TUBE: IMG6040

## 2018-01-16 MED ORDER — IOPAMIDOL (ISOVUE-300) INJECTION 61%: 50.0000 mL | Freq: Once | INTRAVENOUS | Status: DC | PRN

## 2018-01-16 MED ORDER — IOPAMIDOL (ISOVUE-300) INJECTION 61%
INTRAVENOUS | Status: AC
  Filled 2018-01-16: qty 50

## 2018-01-16 NOTE — Procedures (Signed)
Pre procedural Dx: Acute cholecysitis Post procedural Dx: Same  Appropriately positioned and functioning cholecystostomy tube.  No exchange performed.    EBL: None Complications: None immediate   PLAN:   - The patient's cholecystostomy tube was capped for a trial of internalization.  The patient was instructed to no longer flush the cholecystostomy tube.   - The patient was given an extra gravity bag and instructed to reconnect the cholecystostomy tube to the gravity bag if she were to experience recurrent right upper quadrant obstructive symptoms.  - The patient was instructed to call the [facility] radiology department if her cholecystectomy is either postponed or canceled as she is otherwise due for a routine fluoroscopic guided exchange.   The patient demonstrated excellent understanding of this discussion.     Ronny Bacon, MD Pager #: [phone]

## 2018-01-18 DIAGNOSIS — I2510 Atherosclerotic heart disease of native coronary artery without angina pectoris: Secondary | ICD-10-CM | POA: Diagnosis not present

## 2018-01-18 DIAGNOSIS — K819 Cholecystitis, unspecified: Secondary | ICD-10-CM | POA: Diagnosis not present

## 2018-01-18 DIAGNOSIS — J449 Chronic obstructive pulmonary disease, unspecified: Secondary | ICD-10-CM | POA: Diagnosis not present

## 2018-01-18 DIAGNOSIS — Z9981 Dependence on supplemental oxygen: Secondary | ICD-10-CM | POA: Diagnosis not present

## 2018-01-18 DIAGNOSIS — J9611 Chronic respiratory failure with hypoxia: Secondary | ICD-10-CM | POA: Diagnosis not present

## 2018-01-18 DIAGNOSIS — Z7984 Long term (current) use of oral hypoglycemic drugs: Secondary | ICD-10-CM | POA: Diagnosis not present

## 2018-01-18 DIAGNOSIS — K219 Gastro-esophageal reflux disease without esophagitis: Secondary | ICD-10-CM | POA: Diagnosis not present

## 2018-01-18 DIAGNOSIS — Z7901 Long term (current) use of anticoagulants: Secondary | ICD-10-CM | POA: Diagnosis not present

## 2018-01-18 DIAGNOSIS — D519 Vitamin B12 deficiency anemia, unspecified: Secondary | ICD-10-CM | POA: Diagnosis not present

## 2018-01-18 DIAGNOSIS — F419 Anxiety disorder, unspecified: Secondary | ICD-10-CM | POA: Diagnosis not present

## 2018-01-18 DIAGNOSIS — Z96653 Presence of artificial knee joint, bilateral: Secondary | ICD-10-CM | POA: Diagnosis not present

## 2018-01-18 DIAGNOSIS — M1990 Unspecified osteoarthritis, unspecified site: Secondary | ICD-10-CM | POA: Diagnosis not present

## 2018-01-18 DIAGNOSIS — E1122 Type 2 diabetes mellitus with diabetic chronic kidney disease: Secondary | ICD-10-CM | POA: Diagnosis not present

## 2018-01-18 DIAGNOSIS — G4733 Obstructive sleep apnea (adult) (pediatric): Secondary | ICD-10-CM | POA: Diagnosis not present

## 2018-01-18 DIAGNOSIS — I130 Hypertensive heart and chronic kidney disease with heart failure and stage 1 through stage 4 chronic kidney disease, or unspecified chronic kidney disease: Secondary | ICD-10-CM | POA: Diagnosis not present

## 2018-01-18 DIAGNOSIS — F329 Major depressive disorder, single episode, unspecified: Secondary | ICD-10-CM | POA: Diagnosis not present

## 2018-01-18 DIAGNOSIS — I5030 Unspecified diastolic (congestive) heart failure: Secondary | ICD-10-CM | POA: Diagnosis not present

## 2018-01-18 DIAGNOSIS — N182 Chronic kidney disease, stage 2 (mild): Secondary | ICD-10-CM | POA: Diagnosis not present

## 2018-01-18 DIAGNOSIS — E441 Mild protein-calorie malnutrition: Secondary | ICD-10-CM | POA: Diagnosis not present

## 2018-01-18 DIAGNOSIS — M069 Rheumatoid arthritis, unspecified: Secondary | ICD-10-CM | POA: Diagnosis not present

## 2018-01-18 DIAGNOSIS — D869 Sarcoidosis, unspecified: Secondary | ICD-10-CM | POA: Diagnosis not present

## 2018-01-18 DIAGNOSIS — Z6841 Body Mass Index (BMI) 40.0 and over, adult: Secondary | ICD-10-CM | POA: Diagnosis not present

## 2018-01-18 DIAGNOSIS — M797 Fibromyalgia: Secondary | ICD-10-CM | POA: Diagnosis not present

## 2018-01-18 DIAGNOSIS — Z434 Encounter for attention to other artificial openings of digestive tract: Secondary | ICD-10-CM | POA: Diagnosis not present

## 2018-01-20 ENCOUNTER — Telehealth: Payer: Self-pay | Admitting: Adult Health

## 2018-01-20 MED ORDER — AMOXICILLIN-POT CLAVULANATE 875-125 MG PO TABS: 1.0000 | ORAL_TABLET | Freq: Two times a day (BID) | ORAL | 0 refills | Status: DC

## 2018-01-20 NOTE — Telephone Encounter (Signed)
Called spoke with patient - advised of TP's recommendations as stated below Patient voiced her understanding and denied any questions/concerns at this time Rx sent to verified pharmacy Nothing further needed; will sign off

## 2018-01-20 NOTE — Telephone Encounter (Signed)
Please use mucinex DM Twice daily  As needed  Cough/congestion  Augmentin 875mg  Twice daily  For 7 days #14, take with food  If not improving will need ov  Please contact office for sooner follow up if symptoms do not improve or worsen or seek emergency care   Pt called

## 2018-01-20 NOTE — Telephone Encounter (Signed)
Called and spoke with Patient.  She stated that she saw Wyn Quaker, NP, on 01/15/18, and was told to take flonase and mucomyst.  She says that it is not helping.  She feels worse.  She has a sore throat and is coughing up thick, green phlegm.  She is unable to come in this week, because she uses public transportation, and has to let them know 3 days, in advance.  She is requesting a antibiotic.  Tammy P, NP, please advise

## 2018-01-21 NOTE — Progress Notes (Signed)
Cardiology Office Note   Date:  01/23/2018   ID:  SHAJUANA MCLUCAS, DOB November 06, 1960, MRN [id number]  PCP:  Cassandria Anger, MD  Cardiologist:   Jenkins Rouge, MD   No chief complaint on file.     History of Present Illness: [name] is a 58 y.o. female who presents for preoperative consultation Referred by Dr Laurian Brim.  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 Frequent flairs of respiratory distress requiring antibiotics and steroids Also on 2L oxygen [place] CT chest stable on 12/26/17 Previous smoker but not now. After week Rx Augmentin cleared by pulmonary to have surgery 01/15/18  She has had percutaneous drain in GB since August 2019 and is scheduled for cholecystectomy in January   She was seen by me in September 2014 for atypical chest pain and had normal lexiscan myvovue   TTE reviewed from 11/21/16 EF 60-65% no significant valve disease or signs of pulmonary HTN  She has no chest pain or cardiac complaints   Past Medical History:  Diagnosis Date  . Allergic rhinitis   . ALLERGIC RHINITIS 10/26/2009  . Anxiety   . ANXIETY 08/14/2006  . B12 DEFICIENCY 08/25/2007  . Complication of anesthesia    pt has had difficulty following anesthesia with her knee in 2016-unable to care for herself afterward  . Confusion   . Depression    takes Lexapro daily  . Diabetes mellitus without complication ([place])    was on insulin but has been off since Nov 2015 and now only takes Metformin daily  . DYSPNEA 04/28/2009   with exertion  . Esophageal reflux    takes Nexium daily  . Fibromyalgia   . Headache    last migraine 2-107yrs ago;takes Topamax daily  . History of shingles   . Hypertension    takes Coreg daily  . Insomnia    takes Nortriptyline nightly   . Joint pain   . Joint swelling   . Left knee pain   . Lichen planus   . Long-term memory loss   . Nocturia   . OSA  (obstructive sleep apnea)    doesn't use CPAP;sleep study in epic from 2006  . Osteoarthritis   . Osteoarthritis   . Pneumonia    over 30 yrs ago  . Protein calorie malnutrition ([place])   . Rheumatoid arthritis ([place])   . Sarcoidosis    Dr. Lolita Patella  . Short-term memory loss   . Shortness of breath   . TIA (transient ischemic attack)   . Unsteady gait   . Urinary urgency   . Vitamin D deficiency    is supposed to take Vit D but can't afford it  . VITAMIN D DEFICIENCY 08/25/2007    Past Surgical History:  Procedure Laterality Date  . APPENDECTOMY    . arthroscopic knee surgery Right 11-12-04  . AXILLARY ABCESS IRRIGATION AND DEBRIDEMENT  Jul & Aug2012  . CARPAL TUNNEL RELEASE Left 05/23/2014   Procedure: CARPAL TUNNEL RELEASE;  Surgeon: Meredith Pel, MD;  Location: [facility];  Service: Orthopedics;  Laterality: Left;  . cyst removed from top of buttocks  at age 89  . ENDOMETRIAL ABLATION    . IR CHOLANGIOGRAM EXISTING TUBE  11/21/2017  . IR CHOLANGIOGRAM EXISTING TUBE  01/16/2018  . IR EXCHANGE BILIARY DRAIN  11/10/2017  . IR PERC CHOLECYSTOSTOMY  09/13/2017  . IR RADIOLOGIST EVAL & MGMT  10/08/2017  .  LACRIMAL DUCT EXPLORATION Right 06/26/2017   Procedure: LACRIMAL DUCT EXPLORATION AND ETHMOIDECTOMY;  Surgeon: Clista Bernhardt, MD;  Location: [facility];  Service: Ophthalmology;  Laterality: Right;  . TEAR DUCT PROBING Right 06/26/2017   Procedure: TEAR DUCT PROBING WITH STENT;  Surgeon: Clista Bernhardt, MD;  Location: [facility];  Service: Ophthalmology;  Laterality: Right;  . TOTAL KNEE ARTHROPLASTY Right 11/15/2014   Procedure: TOTAL RIGHT KNEE ARTHROPLASTY;  Surgeon: Meredith Pel, MD;  Location: [facility];  Service: Orthopedics;  Laterality: Right;  . TOTAL KNEE ARTHROPLASTY Left 07/13/2015   Procedure: LEFT TOTAL KNEE ARTHROPLASTY;  Surgeon: Meredith Pel, MD;  Location: [facility];  Service: Orthopedics;  Laterality: Left;     Current Outpatient Medications  Medication Sig  Dispense Refill  . acetaminophen (TYLENOL) 325 MG tablet Take 2 tablets (650 mg total) by mouth every 6 (six) hours as needed for mild pain, fever or headache. 30 tablet   . Adalimumab (HUMIRA PEN-PS/UV/ADOL HS START) 40 MG/0.8ML PNKT Inject 40 mg into the skin once a week.    [name] amoxicillin-clavulanate (AUGMENTIN) 875-125 MG tablet Take 1 tablet by mouth 2 (two) times daily. 14 tablet 0  . augmented betamethasone dipropionate (DIPROLENE-AF) 0.05 % ointment Apply topically 2 (two) times daily.  8  . B-D ULTRA-FINE 33 LANCETS MISC Use as instructed to test sugars 4 times daily DX: E11.8 400 each 1  . budesonide-formoterol (SYMBICORT) 160-4.5 MCG/ACT inhaler Inhale 2 puffs into the lungs TWICE DAILY 120/4=30 10.2 g 0  . carvedilol (COREG) 12.5 MG tablet Take 1 tablet (12.5 mg total) by mouth 2 (two) times daily with a meal. 180 tablet 3  . clonazePAM (KLONOPIN) 0.5 MG tablet Take 1-2 tablets (0.5-1 mg total) by mouth at bedtime. (Patient taking differently: Take 0.125 mg by mouth at bedtime. Pt takes 1/4 tablet=0.125 mg qhs) 60 tablet 3  . esomeprazole (NEXIUM) 40 MG capsule Take 1 capsule (40 mg total) by mouth daily before breakfast. 90 capsule 1  . furosemide (LASIX) 40 MG tablet TAKE 1 TABLET BY MOUTH EVERY MORNING FOR EDEMA (Patient taking differently: Take 40 mg by mouth daily as needed for fluid or edema. ) 90 tablet 1  . gabapentin (NEURONTIN) 300 MG capsule Take 1 capsule (300 mg total) by mouth 2 (two) times daily as needed (for pain). 180 capsule 1  . glucose blood (EASY TALK BLOOD GLUCOSE TEST) test strip Use as instructed 2x a day 200 each 12  . hydrOXYzine (ATARAX/VISTARIL) 25 MG tablet Take 1 tablet (25 mg total) by mouth every 8 (eight) hours as needed for itching. 60 tablet 1  . JANUVIA 50 MG tablet Take 1 tablet (50 mg total) by mouth daily. 90 tablet 0  . metFORMIN (GLUCOPHAGE-XR) 500 MG 24 hr tablet TAKE 2 TABLETS(1000 MG) BY MOUTH DAILY WITH BREAKFAST 180 tablet 2  . nystatin  (MYCOSTATIN) 100000 UNIT/ML suspension Take 5 mLs (500,000 Units total) by mouth 4 (four) times daily. 60 mL 0  . Probiotic Product (PROBIOTIC-10 PO) Take 1 capsule by mouth daily.    [name] topiramate (TOPAMAX) 50 MG tablet Take 1 tablet (50 mg total) by mouth 2 (two) times daily. 180 tablet 1  . triamcinolone cream (KENALOG) 0.1 % Apply 1 application topically 2 (two) times daily. 80 g 2  . venlafaxine XR (EFFEXOR-XR) 75 MG 24 hr capsule Take 1 capsule (75 mg total) by mouth daily with breakfast. 30 capsule 11  . VENTOLIN HFA 108 (90 Base) MCG/ACT inhaler Inhale 2  puffs into the lungs every 6 (six) hours as needed for wheezing or shortness of breath. 200/8=25 (Patient taking differently: Inhale 2 puffs into the lungs every 6 (six) hours as needed for wheezing or shortness of breath. ) 18 g 11  . VOLTAREN 1 % GEL Apply 2 g topically 2 (two) times daily as needed (for pain). 100/4=25 (Patient taking differently: Apply 4 g topically 4 (four) times daily as needed (pain). ) 100 g 2  . XARELTO 20 MG TABS tablet Take 1 tablet (20 mg total) by mouth at bedtime. 90 tablet 3   No current facility-administered medications for this visit.     Allergies:   Belsomra [suvorexant]; Enalapril maleate; Latex; Nickel; Other; Doxycycline; Hydrochlorothiazide; Hydroxychloroquine sulfate; Lyrica [pregabalin]; and Tape    Social History:  The patient  reports that she has quit smoking. Her smoking use included cigarettes. She has a 5.00 pack-year smoking history. She has never used smokeless tobacco. She reports that she does not drink alcohol or use drugs.   Family History:  The patient's family history includes Breast cancer in her sister and unknown relative; Cancer in her father; Coronary artery disease in her unknown relative and unknown relative; Heart disease in her mother; Heart failure in her unknown relative; Hypertension in her unknown relative; Kidney disease in her mother.    ROS:  Please see the history of  present illness.   Otherwise, review of systems are positive for none.   All other systems are reviewed and negative.    PHYSICAL EXAM: VS:  BP 138/78   Pulse 70   Ht 5\' 7"  (1.702 m)   Wt 262 lb 12 oz (119.2 kg)   SpO2 99%   BMI 41.15 kg/m  , BMI Body mass index is 41.15 kg/m. Affect appropriate Chronically ill obese female  HEENT: normal Neck supple with no adenopathy JVP normal no bruits no thyromegaly Lungs clear with no wheezing and good diaphragmatic motion Heart:  S1/S2 no murmur, no rub, gallop or click PMI normal Abdomen: large pannus with GB drain in place  Distal pulses intact with no bruits Plus one bilateral edema Neuro non-focal Skin warm and dry No muscular weakness    EKG:  08/16/17 NSR normal ECG  01/23/18  NSR normal ECG    Recent Labs: 09/12/2017: B Natriuretic Peptide 204.1 10/15/2017: Magnesium 2.0 12/08/2017: ALT 16; BUN 17; Creatinine, Ser 0.88; Hemoglobin 11.9; Platelets 229.0; Potassium 3.8; Sodium 139    Lipid Panel    Component Value Date/Time   CHOL 237 (H) 06/30/2017 1541   TRIG 271.0 (H) 06/30/2017 1541   HDL 70.00 06/30/2017 1541   CHOLHDL 3 06/30/2017 1541   VLDL 54.2 (H) 06/30/2017 1541   LDLCALC 97 06/29/2008 0908   LDLDIRECT 107.0 06/30/2017 1541      Wt Readings from Last 3 Encounters:  01/23/18 262 lb 12 oz (119.2 kg)  01/15/18 260 lb 9.6 oz (118.2 kg)  12/29/17 253 lb (114.8 kg)      Other studies Reviewed: Additional studies/ records that were reviewed today include: Notes form cardiology 2014 , Myovue notes from pulmonary labs, CT notes from general surgery TTE done 2018 .    ASSESSMENT AND PLAN:  1.  Preoperative Clearance:  No history of cardiac disease. Normal myovue 2014, Normal EF by echo 2018 with no signs of pulmonary HTN clear to have GB surgery Baseline ECG normal 2. Pulmonary:  Biggest risk for surgery , general anesthesia and prolonged intubation is her chronic respiratory failure.  She was cleared by  pulmonary earlier this month 3. DVT/PE:  Will need to hold xarelto 2 days before surgery  4. Sarcoid:  CT stable continue Humira f/u pulmonary   Current medicines are reviewed at length with the patient today.  The patient does not have concerns regarding medicines.  The following changes have been made:  no change  Labs/ tests ordered today include: None  Orders Placed This Encounter  Procedures  . EKG 12-Lead     Disposition:   FU with cardiology PRN      Signed, Jenkins Rouge, MD  01/23/2018 10:33 AM    [facility] [place], [place],   [address] Phone: [phone]; Fax: [phone]

## 2018-01-22 DIAGNOSIS — J449 Chronic obstructive pulmonary disease, unspecified: Secondary | ICD-10-CM | POA: Diagnosis not present

## 2018-01-22 DIAGNOSIS — I5030 Unspecified diastolic (congestive) heart failure: Secondary | ICD-10-CM | POA: Diagnosis not present

## 2018-01-22 DIAGNOSIS — Z434 Encounter for attention to other artificial openings of digestive tract: Secondary | ICD-10-CM | POA: Diagnosis not present

## 2018-01-22 DIAGNOSIS — J9611 Chronic respiratory failure with hypoxia: Secondary | ICD-10-CM | POA: Diagnosis not present

## 2018-01-22 DIAGNOSIS — I130 Hypertensive heart and chronic kidney disease with heart failure and stage 1 through stage 4 chronic kidney disease, or unspecified chronic kidney disease: Secondary | ICD-10-CM | POA: Diagnosis not present

## 2018-01-22 DIAGNOSIS — K819 Cholecystitis, unspecified: Secondary | ICD-10-CM | POA: Diagnosis not present

## 2018-01-22 NOTE — Telephone Encounter (Signed)
Received fax today for clearance. Pt has a New Pt appt with Dr. Johnsie Cancel 01/23/18. I am going to forward the clearance information that was entered 12/18/17 to Dr. Johnsie Cancel for clearance.

## 2018-01-23 ENCOUNTER — Encounter: Payer: Self-pay | Admitting: Cardiovascular Disease

## 2018-01-23 ENCOUNTER — Ambulatory Visit (INDEPENDENT_AMBULATORY_CARE_PROVIDER_SITE_OTHER): Payer: Medicare Other | Admitting: Cardiovascular Disease

## 2018-01-23 ENCOUNTER — Ambulatory Visit: Payer: Medicare Other | Admitting: Cardiology

## 2018-01-23 VITALS — BP 138/78 | HR 70 | Ht 67.0 in | Wt 262.8 lb

## 2018-01-23 DIAGNOSIS — Z01818 Encounter for other preprocedural examination: Secondary | ICD-10-CM | POA: Diagnosis not present

## 2018-01-23 NOTE — Patient Instructions (Signed)
Medication Instructions:   If you need a refill on your cardiac medications before your next appointment, please call your pharmacy.   Lab work:  If you have labs (blood work) drawn today and your tests are completely normal, you will receive your results only by: [name] MyChart Message (if you have MyChart) OR . A paper copy in the mail If you have any lab test that is abnormal or we need to change your treatment, we will call you to review the results.  Testing/Procedures: None order today.  Follow-Up: At [facility], you and your health needs are our priority.  As part of our continuing mission to provide you with exceptional heart care, we have created designated Provider Care Teams.  These Care Teams include your primary Cardiologist (physician) and Advanced Practice Providers (APPs -  Physician Assistants and Nurse Practitioners) who all work together to provide you with the care you need, when you need it. You will need a follow up appointment in 6 months.  Please call our office 2 months in advance to schedule this appointment.  You may see Dr. Johnsie Cancel or one of the following Advanced Practice Providers on your designated Care Team:   Truitt Merle, NP Cecilie Kicks, NP . Kathyrn Drown, NP

## 2018-01-27 ENCOUNTER — Other Ambulatory Visit: Payer: Self-pay | Admitting: Family

## 2018-01-27 ENCOUNTER — Other Ambulatory Visit: Payer: Self-pay | Admitting: Primary Care

## 2018-01-28 ENCOUNTER — Other Ambulatory Visit: Payer: Self-pay | Admitting: *Deleted

## 2018-01-28 NOTE — Patient Outreach (Signed)
[facility]) Care Management  01/28/2018  [place] April 16, 1960 [id number]   Call placed to member to follow up on current medical status.  She report she is doing much better.  State her drainage bag removed but tube remains in place just in case she begin to have complications.  Surgery to have gallbladder removed scheduled for 1/29.  She denies any concerns at this time, advised to contact with questions.  Will follow up after discharge from hospital after surgery.  Valente David, [place], MSN [place] [phone]

## 2018-01-30 DIAGNOSIS — I130 Hypertensive heart and chronic kidney disease with heart failure and stage 1 through stage 4 chronic kidney disease, or unspecified chronic kidney disease: Secondary | ICD-10-CM | POA: Diagnosis not present

## 2018-01-30 DIAGNOSIS — Z434 Encounter for attention to other artificial openings of digestive tract: Secondary | ICD-10-CM | POA: Diagnosis not present

## 2018-01-30 DIAGNOSIS — J449 Chronic obstructive pulmonary disease, unspecified: Secondary | ICD-10-CM | POA: Diagnosis not present

## 2018-01-30 DIAGNOSIS — J9611 Chronic respiratory failure with hypoxia: Secondary | ICD-10-CM | POA: Diagnosis not present

## 2018-01-30 DIAGNOSIS — I5030 Unspecified diastolic (congestive) heart failure: Secondary | ICD-10-CM | POA: Diagnosis not present

## 2018-01-30 DIAGNOSIS — K819 Cholecystitis, unspecified: Secondary | ICD-10-CM | POA: Diagnosis not present

## 2018-01-31 ENCOUNTER — Other Ambulatory Visit: Payer: Self-pay | Admitting: Family

## 2018-02-02 ENCOUNTER — Other Ambulatory Visit (HOSPITAL_COMMUNITY): Payer: Self-pay | Admitting: Radiology

## 2018-02-02 DIAGNOSIS — K819 Cholecystitis, unspecified: Secondary | ICD-10-CM

## 2018-02-03 ENCOUNTER — Ambulatory Visit (HOSPITAL_COMMUNITY)
Admission: RE | Admit: 2018-02-03 | Discharge: 2018-02-03 | Disposition: A | Discharge status: Home / Self Care | Payer: Medicare Other | Source: Ambulatory Visit | Attending: Radiology | Admitting: Radiology

## 2018-02-03 ENCOUNTER — Encounter: Payer: Self-pay | Admitting: Radiology

## 2018-02-03 DIAGNOSIS — K819 Cholecystitis, unspecified: Secondary | ICD-10-CM | POA: Diagnosis not present

## 2018-02-03 HISTORY — PX: IR PATIENT EVAL TECH 0-60 MINS: IMG5564

## 2018-02-03 NOTE — Procedures (Signed)
Patient came in today for drainage at cholecystostomy tube and pain.  The site was evaluated . The stitch was removed.  The catheter was secured with a new statlock and covered with gauze and tegaderm.  She states she is having surgery on the 29th.  She was advised to be very careful to avoid pulling on the catheter since the stitch is no longer in place.  She will call if she has any new concerns.

## 2018-02-04 ENCOUNTER — Encounter (HOSPITAL_COMMUNITY): Payer: Self-pay

## 2018-02-04 ENCOUNTER — Other Ambulatory Visit: Payer: Self-pay | Admitting: Family

## 2018-02-04 NOTE — Pre-Procedure Instructions (Signed)
[name]  02/04/2018      Your procedure is scheduled on February 11, 2018.  Report to [facility] Admitting at 05:30 A.M.  Call this number if you have problems the morning of surgery:  [phone]   Remember:  Do not eat or drink after midnight.    Take these medicines the morning of surgery with A SIP OF WATER : Budesonide-Formoterol (Symbicort) Carvedilol (Coreg) Esomeprazole (Nexium) Gabapentin (Neurontin) Hydroxyzine (Atarax) Venlafaxine XR (Effexor-XR) Topiramate (Topamax) Fluticasone (Flonase) nasal spray if needed Ventolin Inhaler if needed---bring with you  STOP your Xarelto 2 days prior to surgery per your Doctor.  7 days prior to surgery STOP taking any Aspirin (unless otherwise instructed by your surgeon), Voltaren gel, Aleve, Naproxen, Ibuprofen, Motrin, Advil, Goody's, BC's, all herbal medications, fish oil, and all vitamins.   WHAT DO I DO ABOUT MY DIABETES MEDICATION?   [name] Do not take oral diabetes medicines (pills) the morning of surgery. DO NOT take your Metformin (Glucophage) the morning of surgery.   How to Manage Your Diabetes Before and After Surgery  Why is it important to control my blood sugar before and after surgery? . Improving blood sugar levels before and after surgery helps healing and can limit problems. . A way of improving blood sugar control is eating a healthy diet by: o  Eating less sugar and carbohydrates o  Increasing activity/exercise o  Talking with your doctor about reaching your blood sugar goals . High blood sugars (greater than 180 mg/dL) can raise your risk of infections and slow your recovery, so you will need to focus on controlling your diabetes during the weeks before surgery. . Make sure that the doctor who takes care of your diabetes knows about your planned surgery including the date and location.  How do I manage my blood sugar before surgery? . Check your blood sugar at least 4 times a  day, starting 2 days before surgery, to make sure that the level is not too high or low. o Check your blood sugar the morning of your surgery when you wake up and every 2 hours until you get to the Short Stay unit. . If your blood sugar is less than 70 mg/dL, you will need to treat for low blood sugar: o Do not take insulin. o Treat a low blood sugar (less than 70 mg/dL) with  cup of clear juice (cranberry or apple), 4 glucose tablets, OR glucose gel. o Recheck blood sugar in 15 minutes after treatment (to make sure it is greater than 70 mg/dL). If your blood sugar is not greater than 70 mg/dL on recheck, call [phone] for further instructions. . Report your blood sugar to the short stay nurse when you get to Short Stay.  . If you are admitted to the hospital after surgery: o Your blood sugar will be checked by the staff and you will probably be given insulin after surgery (instead of oral diabetes medicines) to make sure you have good blood sugar levels. o The goal for blood sugar control after surgery is 80-180 mg/dL.      Do not wear jewelry, make-up or nail polish.  Do not wear lotions, powders, or perfumes, or deodorant.  Do not shave 48 hours prior to surgery.    Do not bring valuables to the hospital.  [facility] is not responsible for any belongings or valuables.  Contacts, dentures or bridgework may not be worn into surgery.  Leave your suitcase in  the car.  After surgery it may be brought to your room.  For patients admitted to the hospital, discharge time will be determined by your treatment team.  Patients discharged the day of surgery will not be allowed to drive home.   Special instructions:   [place]- Preparing For Surgery  Before surgery, you can play an important role. Because skin is not sterile, your skin needs to be as free of germs as possible. You can reduce the number of germs on your skin by washing with CHG (chlorahexidine gluconate) Soap before  surgery.  CHG is an antiseptic cleaner which kills germs and bonds with the skin to continue killing germs even after washing.    Oral Hygiene is also important to reduce your risk of infection.  Remember - BRUSH YOUR TEETH THE MORNING OF SURGERY WITH YOUR REGULAR TOOTHPASTE  Please do not use if you have an allergy to CHG or antibacterial soaps. If your skin becomes reddened/irritated stop using the CHG.  Do not shave (including legs and underarms) for at least 48 hours prior to first CHG shower. It is OK to shave your face.  Please follow these instructions carefully.   1. Shower the NIGHT BEFORE SURGERY and the MORNING OF SURGERY with CHG.   2. If you chose to wash your hair, wash your hair first as usual with your normal shampoo.  3. After you shampoo, rinse your hair and body thoroughly to remove the shampoo.  4. Use CHG as you would any other liquid soap. You can apply CHG directly to the skin and wash gently with a scrungie or a clean washcloth.   5. Apply the CHG Soap to your body ONLY FROM THE NECK DOWN.  Do not use on open wounds or open sores. Avoid contact with your eyes, ears, mouth and genitals (private parts). Wash Face and genitals (private parts)  with your normal soap.  6. Wash thoroughly, paying special attention to the area where your surgery will be performed.  7. Thoroughly rinse your body with warm water from the neck down.  8. DO NOT shower/wash with your normal soap after using and rinsing off the CHG Soap.  9. Pat yourself dry with a CLEAN TOWEL.  10. Wear CLEAN PAJAMAS to bed the night before surgery, wear comfortable clothes the morning of surgery  11. Place CLEAN SHEETS on your bed the night of your first shower and DO NOT SLEEP WITH PETS.    Day of Surgery:  Do not apply any deodorants/lotions.  Please wear clean clothes to the hospital/surgery center.   Remember to brush your teeth WITH YOUR REGULAR TOOTHPASTE.    Please read over the following  fact sheets that you were given.

## 2018-02-05 ENCOUNTER — Other Ambulatory Visit: Payer: Self-pay

## 2018-02-05 ENCOUNTER — Encounter (HOSPITAL_COMMUNITY)
Admission: RE | Admit: 2018-02-05 | Discharge: 2018-02-05 | Disposition: A | Discharge status: Home / Self Care | Payer: Medicare Other | Source: Ambulatory Visit | Attending: Surgery | Admitting: Surgery

## 2018-02-05 ENCOUNTER — Encounter (HOSPITAL_COMMUNITY): Payer: Self-pay

## 2018-02-05 DIAGNOSIS — Z01812 Encounter for preprocedural laboratory examination: Secondary | ICD-10-CM | POA: Diagnosis not present

## 2018-02-05 LAB — PROTIME-INR
INR: 1.14
Prothrombin Time: 14.5 seconds (ref 11.4–15.2)

## 2018-02-05 LAB — CBC
HCT: 36.9 % (ref 36.0–46.0)
Hemoglobin: 11 g/dL — ABNORMAL LOW (ref 12.0–15.0)
MCH: 28.4 pg (ref 26.0–34.0)
MCHC: 29.8 g/dL — ABNORMAL LOW (ref 30.0–36.0)
MCV: 95.3 fL (ref 80.0–100.0)
Platelets: 209 10*3/uL (ref 150–400)
RBC: 3.87 MIL/uL (ref 3.87–5.11)
RDW: 14.4 % (ref 11.5–15.5)
WBC: 5.8 10*3/uL (ref 4.0–10.5)
nRBC: 0 % (ref 0.0–0.2)

## 2018-02-05 LAB — BASIC METABOLIC PANEL
ANION GAP: 10 (ref 5–15)
BUN: 14 mg/dL (ref 6–20)
CO2: 21 mmol/L — ABNORMAL LOW (ref 22–32)
Calcium: 9 mg/dL (ref 8.9–10.3)
Chloride: 106 mmol/L (ref 98–111)
Creatinine, Ser: 0.99 mg/dL (ref 0.44–1.00)
GFR calc Af Amer: >60 mL/min (ref >60)
GFR calc non Af Amer: >60 mL/min (ref >60)
Glucose, Bld: 218 mg/dL — ABNORMAL HIGH (ref 70–99)
Potassium: 4 mmol/L (ref 3.5–5.1)
Sodium: 137 mmol/L (ref 135–145)

## 2018-02-05 LAB — HEMOGLOBIN A1C
HEMOGLOBIN A1C: 5.9 % — AB (ref 4.8–5.6)
Mean Plasma Glucose: 122.63 mg/dL

## 2018-02-05 LAB — GLUCOSE, CAPILLARY: Glucose-Capillary: 210 mg/dL — ABNORMAL HIGH (ref 70–99)

## 2018-02-05 NOTE — Progress Notes (Addendum)
PCP - Lew Dawes, MD Cardiologist - Dr. Jenkins Rouge Pulmonologist-Dr. Kara Mead  Chest x-ray - 08/2017 in EPIC EKG - 01/2018 in [facility]  Stress Test - 02/2013 Epic (media) ECHO - 11/2016 EPIC  Cardiac Cath -   Sleep Study - 2006 in EPIC CPAP - no  Fasting Blood Sugar - 115 Checks Blood Sugar _____ times a day-1  Blood Thinner Instructions: Xarelto -hold 02/09/17 -2 days prior to procedure Aspirin Instructions: n/a  Anesthesia review: YES-heart hx, clearance on chart, home oxygen  Patient denies shortness of breath, fever, cough and chest pain at PAT appointment  Patient verbalized understanding of instructions that were given to them at the PAT appointment. Patient was also instructed that they will need to review over the PAT instructions again at home before surgery.  Patient is on home oxygen 24/7 due to sarcoidosis.  Called and informed Karoline Caldwell PA-C and pulmonary and cardiac clearance are in the patient's records.  She has had no recent changes in pulmonary status.  Anesthesia will review her chart but does not need to see her today.

## 2018-02-06 NOTE — Progress Notes (Signed)
Anesthesia Chart Review:  Case:  [id number] Date/Time:  02/11/18 0715   Procedure:  LAPAROSCOPIC CHOLECYSTECTOMY WITH INTRAOPERATIVE CHOLANGIOGRAM ERAS PATHWAY (N/A )   Anesthesia type:  General   Pre-op diagnosis:  Chronic cholecystitis   Location:  [facility] OR ROOM 02 / [facility]   Surgeon:  Donnie Mesa, MD      DISCUSSION: 58 yo female former smoker. Pertinent hx includes COPD/Sarcoidosis, Chronic respiratory failure (On continuous 2L O2), HTN, DOE, TIA, OSA not on CPAP, DMII, Rheumatoid arthritis, PE on Xarelto, PERC drain to her gallbladder since August 2019.  Cardiac clearance from Dr. Johnsie Cancel 01/23/2018 states "Preoperative Clearance:  No history of cardiac disease. Normal myovue 2014, Normal EF by echo 2018 with no signs of pulmonary HTN clear to have GB surgery Baseline ECG normal."  Pulmonary clearance 12/29/2017 by Rexene Edison, NP states "Patient is considered a moderate risk for laparoscopic abdominal surgery for postop pulmonary complications.  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  She would need to stop Xarelto for 2 days prior to surgery and resume on the evening of surgery if appropriate. She is aware of these potential factors."  Pt was treated for COPD exacerbation 01/15/18, reported resolution of symptoms at PAT appt 02/05/18.  Anticipate she can proceed as planned barring acute status change.   VS: BP 102/80   Pulse 87   Temp 36.7 C   Resp 20   Ht 5\' 7"  (1.702 m)   Wt 119.2 kg   LMP 05/19/2003   SpO2 92%   BMI 41.14 kg/m   PROVIDERS: Plotnikov, Evie Lacks, MD is PCP  Jenkins Rouge, MD is Cardiologist  Kara Mead, MD is Pulmonologist  LABS: Labs reviewed: Acceptable for surgery. (all labs ordered are listed, but only abnormal results are displayed)  Labs Reviewed  GLUCOSE, CAPILLARY - Abnormal; Notable for the following components:      Result Value   Glucose-Capillary 210 (*)    All other components within normal limits  BASIC METABOLIC  PANEL - Abnormal; Notable for the following components:   CO2 21 (*)    Glucose, Bld 218 (*)    All other components within normal limits  CBC - Abnormal; Notable for the following components:   Hemoglobin 11.0 (*)    MCHC 29.8 (*)    All other components within normal limits  HEMOGLOBIN A1C - Abnormal; Notable for the following components:   Hgb A1c MFr Bld 5.9 (*)    All other components within normal limits  PROTIME-INR     IMAGES: CT Chest 12/26/2017: IMPRESSION: 1. No acute cardiopulmonary abnormalities. 2. Chronic changes of sarcoid appears stable when compared with 07/21/2016. This includes peri lymphatic micro nodularity and patchy nodular airspace densities in both lungs. Unchanged appearance of bilateral calcified mediastinal and hilar adenopathy. 3. Right upper quadrant percutaneous cholecystostomy tube in satisfactory position. 4.  Aortic Atherosclerosis (ICD10-I70.0). 5. Coronary artery atherosclerotic calcifications.   EKG: 01/23/2018: NSR. Rate 69.   PULM: PFT: PFT Results Latest Ref Rng & Units 12/30/2016 05/18/2014  FVC-Pre L 2.07 2.29  FVC-Predicted Pre % 54 61  FVC-Post L 2.18 2.43  FVC-Predicted Post % 57 65  Pre FEV1/FVC % % 70 69  Post FEV1/FCV % % 68 70  FEV1-Pre L 1.46 1.58  FEV1-Predicted Pre % 49 54  FEV1-Post L 1.49 1.70  DLCO UNC% % 41 62  DLCO COR %Predicted % 66 91  TLC L 4.07 -  TLC % Predicted % 74 -  RV % Predicted % 92 -     CV: TTE 11/21/2016: Study Conclusions  - Left ventricle: The cavity size was normal. Wall thickness was   increased in a pattern of mild LVH. Systolic function was normal.   The estimated ejection fraction was in the range of 60% to 65%.   Although no diagnostic regional wall motion abnormality was   identified, this possibility cannot be completely excluded on the   basis of this study. Doppler parameters are consistent with   abnormal left ventricular relaxation (grade 1 diastolic   dysfunction). -  Aortic valve: There was no stenosis. - Mitral valve: There was no significant regurgitation. - Right ventricle: The cavity size was normal. Systolic function   was normal. - Pulmonary arteries: No complete TR doppler jet so unable to   estimate PA systolic pressure. - Systemic veins: IVC not visualized.  Impressions:  - Normal LV size with mild LV hypertrophy. EF 60-65%. Normal RV   size and systolic function. No significant valvular   abnormalities.  Nuclear stress 02/09/2013: Overall Impression:  Low risk stress nuclear study with a small, mild, partially reversible apical defect consistent with ischemia; findings could also be due to shifting breast attenuation..  LV Ejection Fraction: 68%.  LV Wall Motion:  NL LV Function; NL Wall Motion  [name]  Past Medical History:  Diagnosis Date  . Allergic rhinitis   . ALLERGIC RHINITIS 10/26/2009  . Anxiety   . ANXIETY 08/14/2006  . B12 DEFICIENCY 08/25/2007  . Complication of anesthesia    pt has had difficulty following anesthesia with her knee in 2016-unable to care for herself afterward  . Confusion   . Depression    takes Lexapro daily  . Diabetes mellitus without complication ([place])    was on insulin but has been off since Nov 2015 and now only takes Metformin daily  . DYSPNEA 04/28/2009   with exertion  . Esophageal reflux    takes Nexium daily  . Fibromyalgia   . Headache    last migraine 2-43yrs ago;takes Topamax daily  . History of shingles   . Hypertension    takes Coreg daily  . Insomnia    takes Nortriptyline nightly   . Joint pain   . Joint swelling   . Left knee pain   . Lichen planus   . Long-term memory loss   . Nocturia   . OSA (obstructive sleep apnea)    doesn't use CPAP;sleep study in epic from 2006  . Osteoarthritis   . Osteoarthritis   . Pneumonia    over 30 yrs ago  . Protein calorie malnutrition ([place])   . Rheumatoid arthritis ([place])   . Sarcoidosis    Dr. Lolita Patella  . Short-term  memory loss   . Shortness of breath   . TIA (transient ischemic attack)   . Unsteady gait   . Urinary urgency   . Vitamin D deficiency    is supposed to take Vit D but can't afford it  . VITAMIN D DEFICIENCY 08/25/2007    Past Surgical History:  Procedure Laterality Date  . APPENDECTOMY    . arthroscopic knee surgery Right 11-12-04  . AXILLARY ABCESS IRRIGATION AND DEBRIDEMENT  Jul & Aug2012  . CARPAL TUNNEL RELEASE Left 05/23/2014   Procedure: CARPAL TUNNEL RELEASE;  Surgeon: Meredith Pel, MD;  Location: [facility];  Service: Orthopedics;  Laterality: Left;  . cyst removed from top of buttocks  at age 50  . ENDOMETRIAL ABLATION    .  IR CHOLANGIOGRAM EXISTING TUBE  11/21/2017  . IR CHOLANGIOGRAM EXISTING TUBE  01/16/2018  . IR EXCHANGE BILIARY DRAIN  11/10/2017  . IR PATIENT EVAL TECH 0-60 MINS  02/03/2018  . IR PERC CHOLECYSTOSTOMY  09/13/2017  . IR RADIOLOGIST EVAL & MGMT  10/08/2017  . LACRIMAL DUCT EXPLORATION Right 06/26/2017   Procedure: LACRIMAL DUCT EXPLORATION AND ETHMOIDECTOMY;  Surgeon: Clista Bernhardt, MD;  Location: [facility];  Service: Ophthalmology;  Laterality: Right;  . TEAR DUCT PROBING Right 06/26/2017   Procedure: TEAR DUCT PROBING WITH STENT;  Surgeon: Clista Bernhardt, MD;  Location: [facility];  Service: Ophthalmology;  Laterality: Right;  . TOTAL KNEE ARTHROPLASTY Right 11/15/2014   Procedure: TOTAL RIGHT KNEE ARTHROPLASTY;  Surgeon: Meredith Pel, MD;  Location: [facility];  Service: Orthopedics;  Laterality: Right;  . TOTAL KNEE ARTHROPLASTY Left 07/13/2015   Procedure: LEFT TOTAL KNEE ARTHROPLASTY;  Surgeon: Meredith Pel, MD;  Location: [facility];  Service: Orthopedics;  Laterality: Left;    MEDICATIONS: . Adalimumab (HUMIRA PEN-PS/UV/ADOL HS START) 40 MG/0.8ML PNKT  . augmented betamethasone dipropionate (DIPROLENE-AF) 0.05 % ointment  . B-D ULTRA-FINE 33 LANCETS MISC  . budesonide-formoterol (SYMBICORT) 160-4.5 MCG/ACT inhaler  . carvedilol (COREG) 12.5 MG  tablet  . clonazePAM (KLONOPIN) 0.5 MG tablet  . esomeprazole (NEXIUM) 40 MG capsule  . fluticasone (FLONASE) 50 MCG/ACT nasal spray  . furosemide (LASIX) 40 MG tablet  . gabapentin (NEURONTIN) 300 MG capsule  . glucose blood (EASY TALK BLOOD GLUCOSE TEST) test strip  . hydrOXYzine (ATARAX/VISTARIL) 25 MG tablet  . metFORMIN (GLUCOPHAGE-XR) 500 MG 24 hr tablet  . topiramate (TOPAMAX) 50 MG tablet  . venlafaxine XR (EFFEXOR-XR) 75 MG 24 hr capsule  . VENTOLIN HFA 108 (90 Base) MCG/ACT inhaler  . VOLTAREN 1 % GEL  . XARELTO 20 MG TABS tablet   No current facility-administered medications for this encounter.     Wynonia Musty [facility]/Anesthesiology Phone [phone] 02/06/2018 11:43 AM

## 2018-02-06 NOTE — Anesthesia Preprocedure Evaluation (Addendum)
Anesthesia Evaluation  Patient identified by MRN, date of birth, ID band Patient awake    Reviewed: Allergy & Precautions, NPO status , Patient's Chart, lab work & pertinent test results  History of Anesthesia Complications Negative for: history of anesthetic complications  Airway Mallampati: III  TM Distance: >3 FB Neck ROM: Full    Dental  (+) Edentulous Upper   Pulmonary shortness of breath, sleep apnea , COPD,  oxygen dependent, former smoker, PE sarcoidosis   breath sounds clear to auscultation       Cardiovascular hypertension,  Rhythm:Regular Rate:Normal     Neuro/Psych PSYCHIATRIC DISORDERS Depression TIA   GI/Hepatic Neg liver ROS, GERD  ,  Endo/Other  diabetes, Type 2Morbid obesity  Renal/GU Renal disease  negative genitourinary   Musculoskeletal  (+) Arthritis , Rheumatoid disorders,  Fibromyalgia -  Abdominal   Peds  Hematology  (+) anemia ,   Anesthesia Other Findings From PAT Note by Karoline Caldwell, PA-C: "58 yo female former smoker. Pertinent hx includes COPD/Sarcoidosis, Chronic respiratory failure (On continuous 2L O2), HTN, DOE, TIA, OSA not on CPAP, DMII, Rheumatoid arthritis, PE on Xarelto, PERC drain to her gallbladder since August 2019.  Cardiac clearance from Dr. Johnsie Cancel 01/23/2018 states "Preoperative Clearance:No history of cardiac disease. Normal myovue 2014, Normal EF by echo 2018 with no signs of pulmonary HTN clear to have GB surgery Baseline ECG normal."  Pulmonary clearance 12/29/2017 by Rexene Edison, NP states "Patient is considered a moderate risk for laparoscopic abdominal surgery for postop pulmonary complications. 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 She would need to stop Xarelto for 2 days prior to surgery and resume on the evening of surgery if appropriate. She is aware of these potential factors."  Pt was treated for COPD exacerbation 01/15/18, reported  resolution of symptoms at PAT appt 02/05/18."  Reproductive/Obstetrics                          Anesthesia Physical Anesthesia Plan  ASA: III  Anesthesia Plan: General   Post-op Pain Management:    Induction: Intravenous  PONV Risk Score and Plan: 3 and Ondansetron, Dexamethasone, Midazolam and Treatment may vary due to age or medical condition  Airway Management Planned: Oral ETT  Additional Equipment: None  Intra-op Plan:   Post-operative Plan: Extubation in OR and Possible Post-op intubation/ventilation  Informed Consent: I have reviewed the patients History and Physical, chart, labs and discussed the procedure including the risks, benefits and alternatives for the proposed anesthesia with the patient or authorized representative who has indicated his/her understanding and acceptance.     Dental advisory given  Plan Discussed with:   Anesthesia Plan Comments: (Specifically discussed the risk of possible need for postop ventilation and ICU transfer postop given her baseline oxygen requirement and multiple comorbidities. )      Anesthesia Quick Evaluation

## 2018-02-09 ENCOUNTER — Ambulatory Visit: Payer: Medicare Other | Admitting: Internal Medicine

## 2018-02-10 DIAGNOSIS — J9611 Chronic respiratory failure with hypoxia: Secondary | ICD-10-CM | POA: Diagnosis not present

## 2018-02-10 DIAGNOSIS — Z434 Encounter for attention to other artificial openings of digestive tract: Secondary | ICD-10-CM | POA: Diagnosis not present

## 2018-02-10 DIAGNOSIS — K819 Cholecystitis, unspecified: Secondary | ICD-10-CM | POA: Diagnosis not present

## 2018-02-10 DIAGNOSIS — I5030 Unspecified diastolic (congestive) heart failure: Secondary | ICD-10-CM | POA: Diagnosis not present

## 2018-02-10 DIAGNOSIS — J449 Chronic obstructive pulmonary disease, unspecified: Secondary | ICD-10-CM | POA: Diagnosis not present

## 2018-02-10 DIAGNOSIS — I130 Hypertensive heart and chronic kidney disease with heart failure and stage 1 through stage 4 chronic kidney disease, or unspecified chronic kidney disease: Secondary | ICD-10-CM | POA: Diagnosis not present

## 2018-02-10 MED ORDER — DEXTROSE 5 % IV SOLN
3.0000 g | INTRAVENOUS | Status: AC
  Administered 2018-02-11: 3 g via INTRAVENOUS
  Filled 2018-02-10: qty 3

## 2018-02-11 ENCOUNTER — Observation Stay (HOSPITAL_COMMUNITY)
Admission: RE | Admit: 2018-02-11 | Discharge: 2018-02-12 | Disposition: A | Discharge status: Home / Self Care | Payer: Medicare Other | Attending: Surgery | Admitting: Surgery

## 2018-02-11 ENCOUNTER — Ambulatory Visit (HOSPITAL_COMMUNITY): Payer: Medicare Other | Admitting: Anesthesiology

## 2018-02-11 ENCOUNTER — Ambulatory Visit (HOSPITAL_COMMUNITY): Payer: Medicare Other | Admitting: Vascular Surgery

## 2018-02-11 ENCOUNTER — Encounter (HOSPITAL_COMMUNITY)
Admission: RE | Disposition: A | Discharge status: Home / Self Care | Payer: Self-pay | Source: Home / Self Care | Attending: Surgery

## 2018-02-11 ENCOUNTER — Ambulatory Visit (HOSPITAL_COMMUNITY): Payer: Medicare Other

## 2018-02-11 ENCOUNTER — Encounter (HOSPITAL_COMMUNITY): Payer: Self-pay | Admitting: *Deleted

## 2018-02-11 DIAGNOSIS — Z818 Family history of other mental and behavioral disorders: Secondary | ICD-10-CM | POA: Diagnosis not present

## 2018-02-11 DIAGNOSIS — F419 Anxiety disorder, unspecified: Secondary | ICD-10-CM | POA: Insufficient documentation

## 2018-02-11 DIAGNOSIS — K8010 Calculus of gallbladder with chronic cholecystitis without obstruction: Secondary | ICD-10-CM | POA: Diagnosis present

## 2018-02-11 DIAGNOSIS — Z7901 Long term (current) use of anticoagulants: Secondary | ICD-10-CM | POA: Diagnosis not present

## 2018-02-11 DIAGNOSIS — Z419 Encounter for procedure for purposes other than remedying health state, unspecified: Secondary | ICD-10-CM

## 2018-02-11 DIAGNOSIS — Z833 Family history of diabetes mellitus: Secondary | ICD-10-CM | POA: Insufficient documentation

## 2018-02-11 DIAGNOSIS — Z8261 Family history of arthritis: Secondary | ICD-10-CM | POA: Insufficient documentation

## 2018-02-11 DIAGNOSIS — Z9981 Dependence on supplemental oxygen: Secondary | ICD-10-CM | POA: Insufficient documentation

## 2018-02-11 DIAGNOSIS — Z79899 Other long term (current) drug therapy: Secondary | ICD-10-CM | POA: Insufficient documentation

## 2018-02-11 DIAGNOSIS — Z803 Family history of malignant neoplasm of breast: Secondary | ICD-10-CM | POA: Diagnosis not present

## 2018-02-11 DIAGNOSIS — K811 Chronic cholecystitis: Secondary | ICD-10-CM | POA: Insufficient documentation

## 2018-02-11 DIAGNOSIS — M549 Dorsalgia, unspecified: Secondary | ICD-10-CM | POA: Insufficient documentation

## 2018-02-11 DIAGNOSIS — K8012 Calculus of gallbladder with acute and chronic cholecystitis without obstruction: Secondary | ICD-10-CM | POA: Diagnosis present

## 2018-02-11 DIAGNOSIS — Z86711 Personal history of pulmonary embolism: Secondary | ICD-10-CM | POA: Diagnosis not present

## 2018-02-11 DIAGNOSIS — Z87891 Personal history of nicotine dependence: Secondary | ICD-10-CM | POA: Insufficient documentation

## 2018-02-11 DIAGNOSIS — Z809 Family history of malignant neoplasm, unspecified: Secondary | ICD-10-CM | POA: Diagnosis not present

## 2018-02-11 DIAGNOSIS — D649 Anemia, unspecified: Secondary | ICD-10-CM | POA: Insufficient documentation

## 2018-02-11 DIAGNOSIS — K828 Other specified diseases of gallbladder: Secondary | ICD-10-CM | POA: Diagnosis not present

## 2018-02-11 DIAGNOSIS — Z8719 Personal history of other diseases of the digestive system: Secondary | ICD-10-CM | POA: Diagnosis not present

## 2018-02-11 DIAGNOSIS — K219 Gastro-esophageal reflux disease without esophagitis: Secondary | ICD-10-CM | POA: Insufficient documentation

## 2018-02-11 DIAGNOSIS — D869 Sarcoidosis, unspecified: Secondary | ICD-10-CM | POA: Diagnosis not present

## 2018-02-11 DIAGNOSIS — E1122 Type 2 diabetes mellitus with diabetic chronic kidney disease: Secondary | ICD-10-CM | POA: Diagnosis not present

## 2018-02-11 DIAGNOSIS — G4733 Obstructive sleep apnea (adult) (pediatric): Secondary | ICD-10-CM | POA: Insufficient documentation

## 2018-02-11 DIAGNOSIS — N189 Chronic kidney disease, unspecified: Secondary | ICD-10-CM | POA: Diagnosis not present

## 2018-02-11 DIAGNOSIS — Z6841 Body Mass Index (BMI) 40.0 and over, adult: Secondary | ICD-10-CM | POA: Insufficient documentation

## 2018-02-11 DIAGNOSIS — Z86718 Personal history of other venous thrombosis and embolism: Secondary | ICD-10-CM | POA: Diagnosis not present

## 2018-02-11 DIAGNOSIS — F329 Major depressive disorder, single episode, unspecified: Secondary | ICD-10-CM | POA: Insufficient documentation

## 2018-02-11 DIAGNOSIS — J449 Chronic obstructive pulmonary disease, unspecified: Secondary | ICD-10-CM | POA: Diagnosis not present

## 2018-02-11 DIAGNOSIS — E7800 Pure hypercholesterolemia, unspecified: Secondary | ICD-10-CM | POA: Insufficient documentation

## 2018-02-11 DIAGNOSIS — Z881 Allergy status to other antibiotic agents status: Secondary | ICD-10-CM | POA: Diagnosis not present

## 2018-02-11 DIAGNOSIS — Z823 Family history of stroke: Secondary | ICD-10-CM | POA: Diagnosis not present

## 2018-02-11 DIAGNOSIS — Z811 Family history of alcohol abuse and dependence: Secondary | ICD-10-CM | POA: Diagnosis not present

## 2018-02-11 DIAGNOSIS — K812 Acute cholecystitis with chronic cholecystitis: Secondary | ICD-10-CM | POA: Diagnosis not present

## 2018-02-11 DIAGNOSIS — Z91048 Other nonmedicinal substance allergy status: Secondary | ICD-10-CM | POA: Diagnosis not present

## 2018-02-11 DIAGNOSIS — Z885 Allergy status to narcotic agent status: Secondary | ICD-10-CM | POA: Insufficient documentation

## 2018-02-11 DIAGNOSIS — K8018 Calculus of gallbladder with other cholecystitis without obstruction: Secondary | ICD-10-CM | POA: Diagnosis not present

## 2018-02-11 DIAGNOSIS — Z888 Allergy status to other drugs, medicaments and biological substances status: Secondary | ICD-10-CM | POA: Insufficient documentation

## 2018-02-11 DIAGNOSIS — K810 Acute cholecystitis: Principal | ICD-10-CM | POA: Insufficient documentation

## 2018-02-11 DIAGNOSIS — M069 Rheumatoid arthritis, unspecified: Secondary | ICD-10-CM | POA: Insufficient documentation

## 2018-02-11 DIAGNOSIS — I129 Hypertensive chronic kidney disease with stage 1 through stage 4 chronic kidney disease, or unspecified chronic kidney disease: Secondary | ICD-10-CM | POA: Diagnosis not present

## 2018-02-11 DIAGNOSIS — Z8249 Family history of ischemic heart disease and other diseases of the circulatory system: Secondary | ICD-10-CM | POA: Diagnosis not present

## 2018-02-11 DIAGNOSIS — I10 Essential (primary) hypertension: Secondary | ICD-10-CM | POA: Insufficient documentation

## 2018-02-11 DIAGNOSIS — E119 Type 2 diabetes mellitus without complications: Secondary | ICD-10-CM | POA: Insufficient documentation

## 2018-02-11 HISTORY — PX: CHOLECYSTECTOMY: SHX55

## 2018-02-11 LAB — GLUCOSE, CAPILLARY
GLUCOSE-CAPILLARY: 113 mg/dL — AB (ref 70–99)
GLUCOSE-CAPILLARY: 121 mg/dL — AB (ref 70–99)
GLUCOSE-CAPILLARY: 142 mg/dL — AB (ref 70–99)
Glucose-Capillary: 147 mg/dL — ABNORMAL HIGH (ref 70–99)
Glucose-Capillary: 172 mg/dL — ABNORMAL HIGH (ref 70–99)

## 2018-02-11 LAB — CBC
HEMATOCRIT: 34.1 % — AB (ref 36.0–46.0)
HEMOGLOBIN: 10.5 g/dL — AB (ref 12.0–15.0)
MCH: 29.2 pg (ref 26.0–34.0)
MCHC: 30.8 g/dL (ref 30.0–36.0)
MCV: 95 fL (ref 80.0–100.0)
Platelets: 227 10*3/uL (ref 150–400)
RBC: 3.59 MIL/uL — AB (ref 3.87–5.11)
RDW: 14.3 % (ref 11.5–15.5)
WBC: 9.8 10*3/uL (ref 4.0–10.5)
nRBC: 0 % (ref 0.0–0.2)

## 2018-02-11 LAB — PROTIME-INR
INR: 1
Prothrombin Time: 13.1 seconds (ref 11.4–15.2)

## 2018-02-11 LAB — CREATININE, SERUM
Creatinine, Ser: 0.93 mg/dL (ref 0.44–1.00)
GFR calc Af Amer: >60 mL/min (ref >60)
GFR calc non Af Amer: >60 mL/min (ref >60)

## 2018-02-11 SURGERY — LAPAROSCOPIC CHOLECYSTECTOMY WITH INTRAOPERATIVE CHOLANGIOGRAM
Anesthesia: General | Site: Abdomen

## 2018-02-11 MED ORDER — SUGAMMADEX SODIUM 200 MG/2ML IV SOLN
INTRAVENOUS | Status: DC | PRN
  Administered 2018-02-11: 500 mg via INTRAVENOUS

## 2018-02-11 MED ORDER — OXYCODONE HCL 5 MG PO TABS
ORAL_TABLET | ORAL | Status: AC
  Filled 2018-02-11: qty 1

## 2018-02-11 MED ORDER — MIDAZOLAM HCL 2 MG/2ML IJ SOLN
INTRAMUSCULAR | Status: AC
  Filled 2018-02-11: qty 2

## 2018-02-11 MED ORDER — INSULIN ASPART 100 UNIT/ML ~~LOC~~ SOLN: 0.0000 [IU] | Freq: Every day | SUBCUTANEOUS | Status: DC

## 2018-02-11 MED ORDER — FLUTICASONE PROPIONATE 50 MCG/ACT NA SUSP
1.0000 | Freq: Every day | NASAL | Status: DC
  Filled 2018-02-11: qty 16

## 2018-02-11 MED ORDER — PHENYLEPHRINE HCL 10 MG/ML IJ SOLN
INTRAMUSCULAR | Status: DC | PRN
  Administered 2018-02-11 (×4): 80 ug via INTRAVENOUS

## 2018-02-11 MED ORDER — DEXAMETHASONE SODIUM PHOSPHATE 10 MG/ML IJ SOLN
INTRAMUSCULAR | Status: AC
  Filled 2018-02-11: qty 1

## 2018-02-11 MED ORDER — VENLAFAXINE HCL ER 75 MG PO CP24
75.0000 mg | ORAL_CAPSULE | Freq: Every day | ORAL | Status: DC
  Administered 2018-02-12: 75 mg via ORAL
  Filled 2018-02-11: qty 1

## 2018-02-11 MED ORDER — IOPAMIDOL (ISOVUE-300) INJECTION 61%
INTRAVENOUS | Status: AC
  Filled 2018-02-11: qty 50

## 2018-02-11 MED ORDER — LIDOCAINE 2% (20 MG/ML) 5 ML SYRINGE
INTRAMUSCULAR | Status: AC
  Filled 2018-02-11: qty 5

## 2018-02-11 MED ORDER — LACTATED RINGERS IV SOLN
INTRAVENOUS | Status: DC | PRN
  Administered 2018-02-11 (×2): via INTRAVENOUS

## 2018-02-11 MED ORDER — TRAMADOL HCL 50 MG PO TABS: 50.0000 mg | ORAL_TABLET | Freq: Four times a day (QID) | ORAL | Status: DC | PRN

## 2018-02-11 MED ORDER — ONDANSETRON HCL 4 MG/2ML IJ SOLN: 4.0000 mg | Freq: Four times a day (QID) | INTRAMUSCULAR | Status: DC | PRN

## 2018-02-11 MED ORDER — GLYCOPYRROLATE 0.2 MG/ML IJ SOLN
INTRAMUSCULAR | Status: DC | PRN
  Administered 2018-02-11: 0.2 mg via INTRAVENOUS

## 2018-02-11 MED ORDER — MOMETASONE FURO-FORMOTEROL FUM 200-5 MCG/ACT IN AERO
2.0000 | INHALATION_SPRAY | Freq: Two times a day (BID) | RESPIRATORY_TRACT | Status: DC
  Administered 2018-02-11 – 2018-02-12 (×2): 2 via RESPIRATORY_TRACT
  Filled 2018-02-11: qty 8.8

## 2018-02-11 MED ORDER — OXYCODONE HCL 5 MG PO TABS
5.0000 mg | ORAL_TABLET | Freq: Once | ORAL | Status: AC | PRN
  Administered 2018-02-11: 5 mg via ORAL

## 2018-02-11 MED ORDER — ROCURONIUM BROMIDE 100 MG/10ML IV SOLN
INTRAVENOUS | Status: DC | PRN
  Administered 2018-02-11 (×2): 50 mg via INTRAVENOUS

## 2018-02-11 MED ORDER — MORPHINE SULFATE (PF) 2 MG/ML IV SOLN
2.0000 mg | INTRAVENOUS | Status: DC | PRN
  Filled 2018-02-11: qty 1

## 2018-02-11 MED ORDER — CHLORHEXIDINE GLUCONATE CLOTH 2 % EX PADS: 6.0000 | MEDICATED_PAD | Freq: Once | CUTANEOUS | Status: DC

## 2018-02-11 MED ORDER — PROPOFOL 10 MG/ML IV BOLUS
INTRAVENOUS | Status: DC | PRN
  Administered 2018-02-11: 150 mg via INTRAVENOUS

## 2018-02-11 MED ORDER — OXYCODONE HCL 5 MG PO TABS
5.0000 mg | ORAL_TABLET | ORAL | Status: DC | PRN
  Administered 2018-02-11 – 2018-02-12 (×3): 10 mg via ORAL
  Administered 2018-02-12: 5 mg via ORAL
  Administered 2018-02-12: 10 mg via ORAL
  Filled 2018-02-11 (×5): qty 2

## 2018-02-11 MED ORDER — BUPIVACAINE-EPINEPHRINE 0.25% -1:200000 IJ SOLN
INTRAMUSCULAR | Status: DC | PRN
  Administered 2018-02-11: 10 mL

## 2018-02-11 MED ORDER — SODIUM CHLORIDE 0.9 % IR SOLN
Status: DC | PRN
  Administered 2018-02-11: 1

## 2018-02-11 MED ORDER — ONDANSETRON HCL 4 MG/2ML IJ SOLN: 4.0000 mg | Freq: Once | INTRAMUSCULAR | Status: DC | PRN

## 2018-02-11 MED ORDER — INSULIN ASPART 100 UNIT/ML ~~LOC~~ SOLN: 4.0000 [IU] | Freq: Three times a day (TID) | SUBCUTANEOUS | Status: DC

## 2018-02-11 MED ORDER — SODIUM CHLORIDE 0.9 % IV SOLN
INTRAVENOUS | Status: DC | PRN
  Administered 2018-02-11: 30 ug/min via INTRAVENOUS

## 2018-02-11 MED ORDER — FENTANYL CITRATE (PF) 100 MCG/2ML IJ SOLN
INTRAMUSCULAR | Status: AC
  Filled 2018-02-11: qty 2

## 2018-02-11 MED ORDER — 0.9 % SODIUM CHLORIDE (POUR BTL) OPTIME
TOPICAL | Status: DC | PRN
  Administered 2018-02-11: 1000 mL

## 2018-02-11 MED ORDER — EPHEDRINE SULFATE 50 MG/ML IJ SOLN
INTRAMUSCULAR | Status: DC | PRN
  Administered 2018-02-11 (×2): 10 mg via INTRAVENOUS

## 2018-02-11 MED ORDER — LIDOCAINE HCL (CARDIAC) PF 100 MG/5ML IV SOSY
PREFILLED_SYRINGE | INTRAVENOUS | Status: DC | PRN
  Administered 2018-02-11: 100 mg via INTRAVENOUS

## 2018-02-11 MED ORDER — BUPIVACAINE HCL (PF) 0.25 % IJ SOLN
INTRAMUSCULAR | Status: AC
  Filled 2018-02-11: qty 30

## 2018-02-11 MED ORDER — INSULIN ASPART 100 UNIT/ML ~~LOC~~ SOLN
0.0000 [IU] | Freq: Three times a day (TID) | SUBCUTANEOUS | Status: DC
  Administered 2018-02-11 – 2018-02-12 (×3): 2 [IU] via SUBCUTANEOUS

## 2018-02-11 MED ORDER — ONDANSETRON HCL 4 MG/2ML IJ SOLN
INTRAMUSCULAR | Status: AC
  Filled 2018-02-11: qty 2

## 2018-02-11 MED ORDER — CLONAZEPAM 0.5 MG PO TABS
0.5000 mg | ORAL_TABLET | Freq: Every day | ORAL | Status: DC
  Administered 2018-02-11: 0.5 mg via ORAL
  Filled 2018-02-11: qty 1

## 2018-02-11 MED ORDER — FENTANYL CITRATE (PF) 100 MCG/2ML IJ SOLN
INTRAMUSCULAR | Status: DC | PRN
  Administered 2018-02-11: 50 ug via INTRAVENOUS
  Administered 2018-02-11: 100 ug via INTRAVENOUS

## 2018-02-11 MED ORDER — ONDANSETRON 4 MG PO TBDP: 4.0000 mg | ORAL_TABLET | Freq: Four times a day (QID) | ORAL | Status: DC | PRN

## 2018-02-11 MED ORDER — TOPIRAMATE 25 MG PO TABS
50.0000 mg | ORAL_TABLET | Freq: Two times a day (BID) | ORAL | Status: DC
  Administered 2018-02-11 – 2018-02-12 (×2): 50 mg via ORAL
  Filled 2018-02-11 (×2): qty 2

## 2018-02-11 MED ORDER — CARVEDILOL 12.5 MG PO TABS
12.5000 mg | ORAL_TABLET | Freq: Two times a day (BID) | ORAL | Status: DC
  Administered 2018-02-11 – 2018-02-12 (×2): 12.5 mg via ORAL
  Filled 2018-02-11 (×2): qty 1

## 2018-02-11 MED ORDER — ONDANSETRON HCL 4 MG/2ML IJ SOLN
INTRAMUSCULAR | Status: DC | PRN
  Administered 2018-02-11: 4 mg via INTRAVENOUS

## 2018-02-11 MED ORDER — ALBUTEROL SULFATE (2.5 MG/3ML) 0.083% IN NEBU: 2.5000 mg | INHALATION_SOLUTION | Freq: Four times a day (QID) | RESPIRATORY_TRACT | Status: DC | PRN

## 2018-02-11 MED ORDER — FENTANYL CITRATE (PF) 100 MCG/2ML IJ SOLN
25.0000 ug | INTRAMUSCULAR | Status: DC | PRN
  Administered 2018-02-11 (×2): 25 ug via INTRAVENOUS

## 2018-02-11 MED ORDER — POTASSIUM CHLORIDE IN NACL 20-0.9 MEQ/L-% IV SOLN
INTRAVENOUS | Status: DC
  Administered 2018-02-11 (×2): via INTRAVENOUS
  Filled 2018-02-11 (×2): qty 1000

## 2018-02-11 MED ORDER — ENOXAPARIN SODIUM 40 MG/0.4ML ~~LOC~~ SOLN
40.0000 mg | SUBCUTANEOUS | Status: DC
  Administered 2018-02-12: 40 mg via SUBCUTANEOUS
  Filled 2018-02-11: qty 0.4

## 2018-02-11 MED ORDER — BUPIVACAINE-EPINEPHRINE (PF) 0.25% -1:200000 IJ SOLN
INTRAMUSCULAR | Status: AC
  Filled 2018-02-11: qty 30

## 2018-02-11 MED ORDER — PHENYLEPHRINE 40 MCG/ML (10ML) SYRINGE FOR IV PUSH (FOR BLOOD PRESSURE SUPPORT)
PREFILLED_SYRINGE | INTRAVENOUS | Status: AC
  Filled 2018-02-11: qty 10

## 2018-02-11 MED ORDER — FENTANYL CITRATE (PF) 250 MCG/5ML IJ SOLN
INTRAMUSCULAR | Status: AC
  Filled 2018-02-11: qty 5

## 2018-02-11 MED ORDER — ROCURONIUM BROMIDE 50 MG/5ML IV SOSY
PREFILLED_SYRINGE | INTRAVENOUS | Status: AC
  Filled 2018-02-11: qty 10

## 2018-02-11 MED ORDER — PROPOFOL 10 MG/ML IV BOLUS
INTRAVENOUS | Status: AC
  Filled 2018-02-11: qty 20

## 2018-02-11 MED ORDER — OXYCODONE HCL 5 MG/5ML PO SOLN: 5.0000 mg | Freq: Once | ORAL | Status: AC | PRN

## 2018-02-11 MED ORDER — GABAPENTIN 300 MG PO CAPS
300.0000 mg | ORAL_CAPSULE | Freq: Two times a day (BID) | ORAL | Status: DC
  Administered 2018-02-11 – 2018-02-12 (×2): 300 mg via ORAL
  Filled 2018-02-11 (×2): qty 1

## 2018-02-11 MED ORDER — PANTOPRAZOLE SODIUM 40 MG PO TBEC
40.0000 mg | DELAYED_RELEASE_TABLET | Freq: Every day | ORAL | Status: DC
  Administered 2018-02-12: 40 mg via ORAL
  Filled 2018-02-11: qty 1

## 2018-02-11 MED ORDER — HYDROXYZINE HCL 25 MG PO TABS
25.0000 mg | ORAL_TABLET | Freq: Three times a day (TID) | ORAL | Status: DC | PRN
  Administered 2018-02-11 – 2018-02-12 (×2): 25 mg via ORAL
  Filled 2018-02-11 (×3): qty 1

## 2018-02-11 MED ORDER — DEXAMETHASONE SODIUM PHOSPHATE 10 MG/ML IJ SOLN
INTRAMUSCULAR | Status: DC | PRN
  Administered 2018-02-11: 10 mg via INTRAVENOUS

## 2018-02-11 MED ORDER — METFORMIN HCL ER 500 MG PO TB24
1000.0000 mg | ORAL_TABLET | Freq: Every day | ORAL | Status: DC
  Administered 2018-02-12: 1000 mg via ORAL
  Filled 2018-02-11: qty 2

## 2018-02-11 SURGICAL SUPPLY — 48 items
APPLIER CLIP ROT 10 11.4 M/L (STAPLE) ×3
BENZOIN TINCTURE PRP APPL 2/3 (GAUZE/BANDAGES/DRESSINGS) ×3 IMPLANT
CANISTER SUCT 3000ML PPV (MISCELLANEOUS) ×3 IMPLANT
CHLORAPREP W/TINT 26ML (MISCELLANEOUS) ×3 IMPLANT
CLIP APPLIE ROT 10 11.4 M/L (STAPLE) ×1 IMPLANT
CLOSURE WOUND 1/2 X4 (GAUZE/BANDAGES/DRESSINGS)
COUNTER NEEDLE 20 DBL MAG RED (NEEDLE) ×2 IMPLANT
COVER MAYO STAND STRL (DRAPES) ×3 IMPLANT
COVER SURGICAL LIGHT HANDLE (MISCELLANEOUS) ×3 IMPLANT
COVER WAND RF STERILE (DRAPES) ×3 IMPLANT
DRAPE C-ARM 42X72 X-RAY (DRAPES) ×3 IMPLANT
DRSG TEGADERM 2-3/8X2-3/4 SM (GAUZE/BANDAGES/DRESSINGS) ×11 IMPLANT
DRSG TEGADERM 4X4.75 (GAUZE/BANDAGES/DRESSINGS) ×3 IMPLANT
ELECT BLADE 6.5 EXT (BLADE) ×2 IMPLANT
ELECT REM PT RETURN 9FT ADLT (ELECTROSURGICAL) ×3
ELECTRODE REM PT RTRN 9FT ADLT (ELECTROSURGICAL) ×1 IMPLANT
GAUZE SPONGE 2X2 8PLY NS (GAUZE/BANDAGES/DRESSINGS) ×2 IMPLANT
GAUZE SPONGE 2X2 8PLY STRL LF (GAUZE/BANDAGES/DRESSINGS) ×1 IMPLANT
GLOVE BIO SURGEON STRL SZ7 (GLOVE) ×3 IMPLANT
GLOVE BIOGEL PI IND STRL 7.5 (GLOVE) ×1 IMPLANT
GLOVE BIOGEL PI INDICATOR 7.5 (GLOVE) ×2
GOWN STRL REUS W/ TWL LRG LVL3 (GOWN DISPOSABLE) ×3 IMPLANT
GOWN STRL REUS W/TWL LRG LVL3 (GOWN DISPOSABLE) ×6
KIT BASIN OR (CUSTOM PROCEDURE TRAY) ×3 IMPLANT
KIT TURNOVER KIT B (KITS) ×3 IMPLANT
NS IRRIG 1000ML POUR BTL (IV SOLUTION) ×3 IMPLANT
PAD ARMBOARD 7.5X6 YLW CONV (MISCELLANEOUS) ×3 IMPLANT
PENCIL BUTTON HOLSTER BLD 10FT (ELECTRODE) ×2 IMPLANT
POUCH RETRIEVAL ECOSAC 10 (ENDOMECHANICALS) IMPLANT
POUCH RETRIEVAL ECOSAC 10MM (ENDOMECHANICALS)
POUCH SPECIMEN RETRIEVAL 10MM (ENDOMECHANICALS) ×2 IMPLANT
SCISSORS LAP 5X35 DISP (ENDOMECHANICALS) ×3 IMPLANT
SET CHOLANGIOGRAPH 5 50 .035 (SET/KITS/TRAYS/PACK) ×3 IMPLANT
SET IRRIG TUBING LAPAROSCOPIC (IRRIGATION / IRRIGATOR) ×3 IMPLANT
SET TUBE SMOKE EVAC HIGH FLOW (TUBING) ×3 IMPLANT
SLEEVE ENDOPATH XCEL 5M (ENDOMECHANICALS) ×3 IMPLANT
SPECIMEN JAR SMALL (MISCELLANEOUS) ×3 IMPLANT
SPONGE GAUZE 2X2 STER 10/PKG (GAUZE/BANDAGES/DRESSINGS) ×2
STRIP CLOSURE SKIN 1/2X4 (GAUZE/BANDAGES/DRESSINGS) ×1 IMPLANT
SUT MNCRL AB 4-0 PS2 18 (SUTURE) ×3 IMPLANT
TAPE STRIPS DRAPE STRL (GAUZE/BANDAGES/DRESSINGS) ×2 IMPLANT
TOWEL OR 17X24 6PK STRL BLUE (TOWEL DISPOSABLE) ×3 IMPLANT
TOWEL OR 17X26 10 PK STRL BLUE (TOWEL DISPOSABLE) ×3 IMPLANT
TRAY LAPAROSCOPIC MC (CUSTOM PROCEDURE TRAY) ×3 IMPLANT
TROCAR XCEL BLUNT TIP 100MML (ENDOMECHANICALS) ×3 IMPLANT
TROCAR XCEL NON-BLD 11X100MML (ENDOMECHANICALS) ×3 IMPLANT
TROCAR XCEL NON-BLD 5MMX100MML (ENDOMECHANICALS) ×3 IMPLANT
WATER STERILE IRR 1000ML POUR (IV SOLUTION) ×3 IMPLANT

## 2018-02-11 NOTE — Transfer of Care (Signed)
Immediate Anesthesia Transfer of Care Note  Patient: [name]  Procedure(s) Performed: LAPAROSCOPIC CHOLECYSTECTOMY WITH INTRAOPERATIVE CHOLANGIOGRAM ERAS PATHWAY (N/A Abdomen)  Patient Location: PACU  Anesthesia Type:General  Level of Consciousness: awake, alert  and oriented  Airway & Oxygen Therapy: Patient Spontanous Breathing and Patient connected to nasal cannula oxygen  Post-op Assessment: Report given to RN, Post -op Vital signs reviewed and stable and Patient moving all extremities  Post vital signs: Reviewed and stable  Last Vitals:  Vitals Value Taken Time  BP 94/42 02/11/2018  9:50 AM  Temp    Pulse 73 02/11/2018  9:52 AM  Resp 15 02/11/2018  9:52 AM  SpO2 100 % 02/11/2018  9:52 AM  Vitals shown include unvalidated device data.  Last Pain:  Vitals:   02/11/18 0612  TempSrc:   PainSc: 0-No pain         Complications: No apparent anesthesia complications

## 2018-02-11 NOTE — Anesthesia Procedure Notes (Signed)
Procedure Name: Intubation Date/Time: 02/11/2018 7:43 AM Performed by: Ethelyn Cerniglia T, CRNA Pre-anesthesia Checklist: Patient identified, Emergency Drugs available, Suction available and Patient being monitored Patient Re-evaluated:Patient Re-evaluated prior to induction Oxygen Delivery Method: Circle system utilized Preoxygenation: Pre-oxygenation with 100% oxygen Induction Type: IV induction Ventilation: Mask ventilation without difficulty and Oral airway inserted - appropriate to patient size Laryngoscope Size: Mac and 4 Grade View: Grade I Tube type: Oral Tube size: 7.5 mm Number of attempts: 1 Airway Equipment and Method: Patient positioned with wedge pillow and Stylet Placement Confirmation: ETT inserted through vocal cords under direct vision,  positive ETCO2 and breath sounds checked- equal and bilateral Secured at: 22 cm Tube secured with: Tape Dental Injury: Teeth and Oropharynx as per pre-operative assessment

## 2018-02-11 NOTE — Anesthesia Postprocedure Evaluation (Signed)
Anesthesia Post Note  Patient: [name]  Procedure(s) Performed: LAPAROSCOPIC CHOLECYSTECTOMY WITH INTRAOPERATIVE CHOLANGIOGRAM ERAS PATHWAY (N/A Abdomen)     Patient location during evaluation: PACU Anesthesia Type: General Level of consciousness: awake and alert Pain management: pain level controlled Vital Signs Assessment: post-procedure vital signs reviewed and stable Respiratory status: spontaneous breathing, nonlabored ventilation, respiratory function stable and patient connected to nasal cannula oxygen Cardiovascular status: blood pressure returned to baseline and stable Postop Assessment: no apparent nausea or vomiting Anesthetic complications: no    Last Vitals:  Vitals:   02/11/18 1117 02/11/18 1135  BP: 131/75 129/81  Pulse: 79 82  Resp: 16 18  Temp: (!) 36.3 C 36.6 C  SpO2: 94% 99%    Last Pain:  Vitals:   02/11/18 1149  TempSrc:   PainSc: 8                  Richetta Cubillos E Avneet Ashmore

## 2018-02-11 NOTE — H&P (Signed)
History of Present Illness  The patient is a 58 year old female who presents for evaluation of gall stones. PCP - [facility] - [facility] - Johnsie Cancel  This is a 58 year old female with sarcoidosis, morbid obesity, history of DVT/PE requiring anticoagulation who is status post open appendectomy for perforation in 2009. The patient was hospitalized in August of this year for sepsis secondary to acute cholecystitis. She was not a good surgical candidate and a percutaneous cholecystostomy tube was placed. The patient improved somewhat. She was discharged in early September. A follow-up cholangiogram through her cholecystostomy was performed on 10/08/17 and her tube was capped. She was readmitted several days later with decreased appetite, dark urine and sharp right upper quadrant abdominal pain. Her bilirubin increased to 3.3. Her tube was placed back to drainage. Another cholangiogram was performed on 11/21/17 which shows that the cholecystostomy tube is still within the gallbladder. There are no retained stones. The ductal system was patent. The patient comes in for evaluation to discuss possible cholecystectomy. She still occasionally has some right upper quadrant abdominal pain.  INDICATION: 58 year old female with a history of acute cholecystitis August 2019.  Percutaneous cholecystostomy placed 09/13/2017.  The patient returns today with pain at the insertion site.  EXAM: CHOLANGIOGRAM VIA EXISTING CATHETER  MEDICATIONS: None  ANESTHESIA/SEDATION: None  FLUOROSCOPY TIME: Fluoroscopy Time: 0 minutes 18 seconds (10.8 mGy).  COMPLICATIONS: None  PROCEDURE: Informed written consent was obtained from the patient after a thorough discussion of the procedural risks, benefits and alternatives. All questions were addressed. Maximal Sterile Barrier Technique was utilized including caps, mask, sterile gowns, sterile gloves, sterile drape, hand hygiene and  skin antiseptic. A timeout was performed prior to the initiation of the procedure.  Visual inspection performed at the site of insertion. She was complaining at the orientation of the stitching.  Betadine was used to prep the tube.  Injection was performed with images stored sent to PACs.  Catheter was then flushed and attached to gravity drainage.  The retention stitch was then ligated and removed and a new stitch was placed after generous infiltration with 1% lidocaine.  Patient tolerated the procedure well and remained hemodynamically stable throughout.  No complications were encountered and no significant blood loss.  IMPRESSION: Status post drain injection confirms position of the percutaneous cholecystostomy within the gallbladder. No retained stones identified. The ductal system was patent.  The retention stitch was removed and a new stitch placed after local anesthesia for attempt at a more comfortable arrangement.  Signed,  Dulcy Fanny. Dellia Nims, RPVI  [facility]  [facility]   Electronically Signed By: Corrie Mckusick D.O. On: 11/21/2017 16:53   12/08/17 - LFT"s WNL   Problem List/Past Medical  CHRONIC CHOLECYSTITIS WITHOUT CALCULUS (K81.1)  Past Surgical History  Appendectomy Breast Biopsy Left. Knee Surgery Bilateral. Oral Surgery  Diagnostic Studies History  Mammogram within last year Pap Smear >5 years ago  Allergies  Belsomra *HYPNOTICS/SEDATIVES/SLEEP DISORDER AGENTS* night terrors Enalapril Maleate *ANTIHYPERTENSIVES* Cough. Nickel blisters Doxycycline *DERMATOLOGICALS* Diarrhea, Nausea, Vomiting. hydroCHLOROthiazide *DIURETICS* Lyrica *ANTICONVULSANTS* high Adhesive Tape bruising  Medication History  Acetaminophen (325MG Tablet, Oral) Active. Betamethasone Dipropionate Aug (0.05% Ointment, External) Active. Budesonide-Formoterol Fumarate  (160-4.5MCG/ACT Aerosol, Inhalation) Active. clonazePAM (0.5MG Tablet, Oral) Active. Carvedilol (12.5MG Tablet, Oral) Active. Fluticasone Propionate (50MCG/ACT Suspension, Nasal) Active. Esomeprazole Magnesium (40MG Capsule DR, Oral) Active. Gabapentin (300MG Capsule, Oral) Active. Furosemide (40MG Tablet, Oral) Active. Humira Pen-CD/UC/HS Starter (40MG/0.8ML Pen-inj Kit, Subcutaneous) Active. hydrOXYzine HCl (25MG Tablet, Oral) Active.  Januvia (50MG Tablet, Oral) Active. levoFLOXacin (750MG Tablet, Oral) Active. metFORMIN HCl ER (500MG Tablet ER 24HR, Oral) Active. Probiotic (Oral) Active. Topiramate (50MG Tablet, Oral) Active. Triamcinolone Acetonide (0.1% Cream, External) Active. Venlafaxine HCl ER (75MG Capsule ER 24HR, Oral) Active. Voltaren (1% Gel, Transdermal) Active. Xarelto (20MG Tablet, Oral) Active. Ventolin HFA (108 (90 Base)MCG/ACT Aerosol Soln, Inhalation) Active. Medications Reconciled  Social History Alcohol use Occasional alcohol use. Illicit drug use Remotely quit drug use. No caffeine use Tobacco use Former smoker.  Family History  Alcohol Abuse Father. Arthritis Father, Sister. Breast Cancer Sister. Cancer Father. Cerebrovascular Accident Brother. Depression Son. Diabetes Mellitus Mother. Heart Disease Brother, Mother, Sister. Heart disease in female family member before age 35 Heart disease in female family member before age 2 Respiratory Condition Mother.  Pregnancy / Birth History  Age at menarche 69 years. Gravida 1 Length (months) of breastfeeding 3-6 Maternal age 65-25 Para 1  Other Problems  Anxiety Disorder Arthritis Back Pain Chronic Obstructive Lung Disease Depression Diabetes Mellitus Gastroesophageal Reflux Disease High blood pressure Home Oxygen Use Hypercholesterolemia Pulmonary Embolism / Blood Clot in Legs     Review of Systems  General Present- Chills, Fatigue  and Night Sweats. Not Present- Appetite Loss, Fever, Weight Gain and Weight Loss. Skin Present- Dryness. Not Present- Change in Wart/Mole, Hives, Jaundice, New Lesions, Non-Healing Wounds, Rash and Ulcer. HEENT Present- Hearing Loss, Nose Bleed, Ringing in the Ears and Sore Throat. Not Present- Earache, Hoarseness, Oral Ulcers, Seasonal Allergies, Sinus Pain, Visual Disturbances, Wears glasses/contact lenses and Yellow Eyes. Respiratory Present- Chronic Cough and Difficulty Breathing. Not Present- Bloody sputum, Snoring and Wheezing. Cardiovascular Present- Shortness of Breath. Not Present- Chest Pain, Difficulty Breathing Lying Down, Leg Cramps, Palpitations, Rapid Heart Rate and Swelling of Extremities. Gastrointestinal Present- Abdominal Pain, Difficulty Swallowing and Excessive gas. Not Present- Bloating, Bloody Stool, Change in Bowel Habits, Chronic diarrhea, Constipation, Gets full quickly at meals, Hemorrhoids, Indigestion, Nausea, Rectal Pain and Vomiting. Female Genitourinary Present- Nocturia and Urgency. Not Present- Frequency, Painful Urination and Pelvic Pain. Musculoskeletal Present- Back Pain, Joint Pain, Joint Stiffness and Muscle Weakness. Not Present- Muscle Pain and Swelling of Extremities. Neurological Present- Decreased Memory, Trouble walking and Weakness. Not Present- Fainting, Headaches, Numbness, Seizures, Tingling and Tremor. Psychiatric Present- Anxiety, Change in Sleep Pattern and Depression. Not Present- Bipolar, Fearful and Frequent crying. Endocrine Present- Cold Intolerance, Hair Changes and Heat Intolerance. Not Present- Excessive Hunger, Hot flashes and New Diabetes. Hematology Present- Blood Thinners and Easy Bruising. Not Present- Excessive bleeding, Gland problems, HIV and Persistent Infections.  Vitals  Weight: 254 lb Height: 67.5in Body Surface Area: 2.25 m Body Mass Index: 39.19 kg/m  Temp.: 98.58F  Pulse: 91 (Regular)  P.OX: 90% (Room air) BP:  124/76 (Sitting, Left Arm, Standard)      Physical Exam   The physical exam findings are as follows: Note:WDWN in NAD - obese; minimally mobile Eyes: Pupils equal, round; sclera anicteric HENT: Oral mucosa moist; good dentition Neck: No masses palpated, no thyromegaly Lungs: CTA bilaterally; normal respiratory effort CV: Regular rate and rhythm; no murmurs; extremities well-perfused with no edema Abd: +bowel sounds, obese, soft; RUQ drain with clear bile; minimal soreness around drain site. Drain site without sign of infection No hernia - large midline incision Skin: Warm, dry; no sign of jaundice Psychiatric - alert and oriented x 4; calm mood and affect    Assessment & Plan   CHRONIC CHOLECYSTITIS WITHOUT CALCULUS (K81.1)  Current Plans Schedule for Surgery - Laparoscopic cholecystectomy with intraoperative  cholangiogram. The surgical procedure has been discussed with the patient. Potential risks, benefits, alternative treatments, and expected outcomes have been explained. All of the patient's questions at this time have been answered. The likelihood of reaching the patient's treatment goal is good. The patient understand the proposed surgical procedure and wishes to proceed.   Cardiac and pulmonary clearance  Note:The patient has significant medical and cardiac comorbidities that make her high risk for surgery. However she has not tolerated having her tube capped so we cannot consider removing her drain. We will ask her cardiologist and pulmonologist to see if they think that she is a candidate for elective surgery. Her surgery will be complicated by the fact that she has had a large previous exploratory laparotomy. We should still be able to perform this laparoscopically. She will probably require hospitalization for several days postop. Will need to hold her anticoagulation prior to surgery.   Imogene Burn. Georgette Dover, MD, [facility]   General/ Trauma Surgery Beeper [phone]  02/11/2018 7:18 AM

## 2018-02-11 NOTE — Care Management Obs Status (Signed)
[place] NOTIFICATION   Patient Details  Name: [name] MRN: [id number] Date of Birth: Dec 21, 1960   Medicare Observation Status Notification Given:  Yes    Carles Collet, RN 02/11/2018, 3:00 PM

## 2018-02-11 NOTE — Progress Notes (Signed)
Patient refused morphine for pain due to allergic reactions. Morphine wasted with another Nurse, Roosvelt Maser as witness.

## 2018-02-11 NOTE — Op Note (Signed)
Laparoscopic Cholecystectomy with IOC Procedure Note  Indications:  This is a 58 year old female with sarcoidosis, morbid obesity, history of DVT/PE requiring anticoagulation who is status post open appendectomy for perforation in 2009. The patient was hospitalized in August of this year for sepsis secondary to acute cholecystitis. She was not a good surgical candidate and a percutaneous cholecystostomy tube was placed. The patient improved somewhat. She was discharged in early September. A follow-up cholangiogram through her cholecystostomy was performed on 10/08/17 and her tube was capped. She was readmitted several days later with decreased appetite, dark urine and sharp right upper quadrant abdominal pain. Her bilirubin increased to 3.3. Her tube was placed back to drainage. Another cholangiogram was performed on 11/21/17 which shows that the cholecystostomy tube is still within the gallbladder. There are no retained stones. The ductal system was patent. The patient comes in for laparoscoic cholecystectomy. She still occasionally has some right upper quadrant abdominal pain.  Pre-operative Diagnosis: Calculus of gallbladder with other cholecystitis, without mention of obstruction  Post-operative Diagnosis: Same  Surgeon: Maia Petties   Assistants: Carlena Hurl, PA-C  Anesthesia: General endotracheal anesthesia  ASA Class: 3  Procedure Details  The patient was seen again in the Holding Room. The risks, benefits, complications, treatment options, and expected outcomes were discussed with the patient. The possibilities of reaction to medication, pulmonary aspiration, perforation of viscus, bleeding, recurrent infection, finding a normal gallbladder, the need for additional procedures, failure to diagnose a condition, the possible need to convert to an open procedure, and creating a complication requiring transfusion or operation were discussed with the patient. The likelihood of  improving the patient's symptoms with return to their baseline status is good.  The patient and/or family concurred with the proposed plan, giving informed consent. The site of surgery properly noted. The patient was taken to Operating Room, identified as TAZIYAH IANNUZZI and the procedure verified as Laparoscopic Cholecystectomy with Intraoperative Cholangiogram. A Time Out was held and the above information confirmed.  Prior to the induction of general anesthesia, antibiotic prophylaxis was administered. General endotracheal anesthesia was then administered and tolerated well. After the induction, the abdomen was prepped with Chloraprep and draped in the sterile fashion.  The area around the percutaneous cholecystostomy tube was prepped with betadine.  The patient was positioned in the supine position, but her pannus had shifted to her left.  Her umbilicus was about 8 inches from the midline.  Local anesthetic agent was injected into the skin in the apparent midline in the epigastrium and an incision made. We dissected down to the abdominal fascia with blunt dissection.  Using appendiceal retractors and S-retractors, I was able to reach the fascia.  The fascia was incised vertically and we entered the peritoneal cavity bluntly.  A pursestring suture of 0-Vicryl was placed around the fascial opening.  The Hasson cannula was inserted and secured with the stay suture.  Pneumoperitoneum was then created with CO2 and tolerated well without any adverse changes in the patient's vital signs. An 11-mm port was placed in the subxiphoid position.  Two 5-mm ports were placed in the right upper quadrant. All skin incisions were infiltrated with a local anesthetic agent before making the incision and placing the trocars.   We positioned the patient in reverse Trendelenburg.  The drain is seen entering the anterior abdominal wall, traversing the edge of the liver and entering the gallbladder.  We took down the  adhesions around the drain and cut the drainage tube flush with  the surface of the liver.  The gallbladder was identified, the fundus grasped and retracted cephalad. Adhesions were lysed bluntly and with the electrocautery where indicated, taking care not to injure any adjacent organs or viscus. The infundibulum was grasped and retracted laterally, exposing the peritoneum overlying the triangle of Calot. This was then divided and exposed in a blunt fashion. A critical view of the cystic duct and cystic artery was obtained.  The cystic duct was clearly identified and bluntly dissected circumferentially. The cystic duct was ligated with a clip distally.   An incision was made in the cystic duct and the [facility] cholangiogram catheter introduced. The catheter was secured using a clip. A cholangiogram was then obtained which showed good visualization of the distal and proximal biliary tree with no sign of filling defects or obstruction.  Contrast flowed easily into the duodenum. The catheter was then removed.   The cystic duct was then ligated with clips and divided. The cystic artery was identified, dissected free, ligated with clips and divided as well.   The gallbladder was dissected from the liver bed in retrograde fashion with the electrocautery. The gallbladder was removed and placed in an Endocatch sac. The liver bed was irrigated and inspected. Hemostasis was achieved with the electrocautery. Copious irrigation was utilized and was repeatedly aspirated until clear.  The gallbladder and Endocatch sac were then removed through the umbilical port site.  The pursestring suture was used to close the umbilical fascia.    We again inspected the right upper quadrant for hemostasis.  Pneumoperitoneum was released as we removed the trocars.  4-0 Monocryl was used to close the skin.   Benzoin, steri-strips, and clean dressings were applied. The patient was then extubated and brought to the recovery room in stable  condition. Instrument, sponge, and needle counts were correct at closure and at the conclusion of the case.   Findings: Cholecystitis with Cholelithiasis  Estimated Blood Loss: less than 100 mL         Drains: none         Specimens: Gallbladder           Complications: None; patient tolerated the procedure well.         Disposition: PACU - hemodynamically stable.         Condition: stable  Imogene Burn. Georgette Dover, MD, [facility] Surgery  General/ Trauma Surgery Beeper [phone]  02/11/2018 9:12 AM

## 2018-02-12 ENCOUNTER — Other Ambulatory Visit: Payer: Self-pay

## 2018-02-12 ENCOUNTER — Encounter (HOSPITAL_COMMUNITY): Payer: Self-pay | Admitting: General Practice

## 2018-02-12 DIAGNOSIS — K810 Acute cholecystitis: Secondary | ICD-10-CM | POA: Diagnosis not present

## 2018-02-12 DIAGNOSIS — K828 Other specified diseases of gallbladder: Secondary | ICD-10-CM | POA: Diagnosis not present

## 2018-02-12 DIAGNOSIS — K811 Chronic cholecystitis: Secondary | ICD-10-CM | POA: Diagnosis not present

## 2018-02-12 DIAGNOSIS — Z6841 Body Mass Index (BMI) 40.0 and over, adult: Secondary | ICD-10-CM | POA: Diagnosis not present

## 2018-02-12 DIAGNOSIS — D869 Sarcoidosis, unspecified: Secondary | ICD-10-CM | POA: Diagnosis not present

## 2018-02-12 LAB — CBC
HCT: 31.6 % — ABNORMAL LOW (ref 36.0–46.0)
Hemoglobin: 9.8 g/dL — ABNORMAL LOW (ref 12.0–15.0)
MCH: 29.4 pg (ref 26.0–34.0)
MCHC: 31 g/dL (ref 30.0–36.0)
MCV: 94.9 fL (ref 80.0–100.0)
NRBC: 0 % (ref 0.0–0.2)
Platelets: 225 10*3/uL (ref 150–400)
RBC: 3.33 MIL/uL — ABNORMAL LOW (ref 3.87–5.11)
RDW: 14.5 % (ref 11.5–15.5)
WBC: 10 10*3/uL (ref 4.0–10.5)

## 2018-02-12 LAB — COMPREHENSIVE METABOLIC PANEL
ALT: 85 U/L — ABNORMAL HIGH (ref 0–44)
AST: 117 U/L — AB (ref 15–41)
Albumin: 2.8 g/dL — ABNORMAL LOW (ref 3.5–5.0)
Alkaline Phosphatase: 119 U/L (ref 38–126)
Anion gap: 6 (ref 5–15)
BUN: 15 mg/dL (ref 6–20)
CO2: 25 mmol/L (ref 22–32)
Calcium: 8.5 mg/dL — ABNORMAL LOW (ref 8.9–10.3)
Chloride: 110 mmol/L (ref 98–111)
Creatinine, Ser: 0.96 mg/dL (ref 0.44–1.00)
GFR calc Af Amer: >60 mL/min (ref >60)
GFR calc non Af Amer: >60 mL/min (ref >60)
Glucose, Bld: 117 mg/dL — ABNORMAL HIGH (ref 70–99)
Potassium: 3.8 mmol/L (ref 3.5–5.1)
Sodium: 141 mmol/L (ref 135–145)
Total Bilirubin: 0.4 mg/dL (ref 0.3–1.2)
Total Protein: 6.6 g/dL (ref 6.5–8.1)

## 2018-02-12 LAB — GLUCOSE, CAPILLARY: GLUCOSE-CAPILLARY: 125 mg/dL — AB (ref 70–99)

## 2018-02-12 MED ORDER — OXYCODONE HCL 5 MG PO TABS: 5.0000 mg | ORAL_TABLET | Freq: Four times a day (QID) | ORAL | 0 refills | Status: DC | PRN

## 2018-02-12 NOTE — Discharge Summary (Signed)
Physician Discharge Summary  Patient ID: [name] MRN: [id number] DOB/AGE: 1960/03/02 58 y.o.  Admit date: 02/11/2018 Discharge date: 02/12/2018  Admission Diagnoses:  Chronic cholecystitis   Morbid obesity   COPD  Discharge Diagnoses: same Active Problems:   Chronic cholecystitis with calculus   Discharged Condition: good  Hospital Course: Laparoscopic cholecystectomy with IOC on 02/11/18.  She stayed overnight because of her cardiac and pulmonary comorbidities.  Minimal pain overnight.  No nausea.  Feels much better and ready for discharge  Consults: None  Treatments: surgery: lap chole with IOC  Discharge Exam: Blood pressure 131/70, pulse 74, temperature 98 F (36.7 C), temperature source Oral, resp. rate 18, height 5\' 7"  (1.702 m), weight 117 kg, last menstrual period 05/19/2003, SpO2 98 %. General appearance: alert, cooperative and no distress Resp: clear to auscultation bilaterally Cardio: regular rate and rhythm, S1, S2 normal, no murmur, click, rub or gallop GI: obese, soft, minimal tenderness; dressings c/d/i  Disposition: Discharge disposition: 01-Home or Self Care       Discharge Instructions    Call MD for:  persistant nausea and vomiting   Complete by:  As directed    Call MD for:  redness, tenderness, or signs of infection (pain, swelling, redness, odor or green/yellow discharge around incision site)   Complete by:  As directed    Call MD for:  severe uncontrolled pain   Complete by:  As directed    Call MD for:  temperature >100.4   Complete by:  As directed    Diet general   Complete by:  As directed    Driving Restrictions   Complete by:  As directed    Do not drive while taking pain medications   Increase activity slowly   Complete by:  As directed    May shower / Bathe   Complete by:  As directed      Allergies as of 02/12/2018      Reactions   Belsomra [suvorexant] Other (See Comments)   [place] out of bed while asleep: "I'm  waking up as I'm falling on the floor;" "Night terrors"   Enalapril Maleate Cough   Latex Hives   Nickel Other (See Comments)   Blisters  Pt has a titanium right and left knee - nickel causes skin irritations that form into blisters and sores   Other Other (See Comments)   Patient has Sarcoidosis and can't tolerate any metals   Morphine And Related Itching, Nausea Only   Doxycycline Diarrhea, Nausea And Vomiting   Hydrochlorothiazide Other (See Comments)   Low potassium levels    Hydroxychloroquine Sulfate Other (See Comments)   Vision changes    Lyrica [pregabalin] Other (See Comments)   "Made me feel high"   Tape Other (See Comments)   Medical tape causes bruising      Medication List    TAKE these medications   augmented betamethasone dipropionate 0.05 % ointment Commonly known as:  DIPROLENE-AF APPLY TWICE DAILY   B-D ULTRA-FINE 33 LANCETS Misc Use as instructed to test sugars 4 times daily DX: E11.8   budesonide-formoterol 160-4.5 MCG/ACT inhaler Commonly known as:  SYMBICORT Inhale 2 puffs into the lungs TWICE DAILY 120/4=30   carvedilol 12.5 MG tablet Commonly known as:  COREG Take 1 tablet (12.5 mg total) by mouth 2 (two) times daily with a meal.   clonazePAM 0.5 MG tablet Commonly known as:  KLONOPIN Take 1-2 tablets (0.5-1 mg total) by mouth at bedtime. What changed:  how much  to take   esomeprazole 40 MG capsule Commonly known as:  NEXIUM Take 1 capsule (40 mg total) by mouth daily before breakfast.   fluticasone 50 MCG/ACT nasal spray Commonly known as:  FLONASE Place 1 spray into both nostrils daily. What changed:    when to take this  reasons to take this   furosemide 40 MG tablet Commonly known as:  LASIX TAKE 1 TABLET BY MOUTH EVERY MORNING FOR EDEMA What changed:  See the new instructions.   gabapentin 300 MG capsule Commonly known as:  NEURONTIN Take 1 capsule (300 mg total) by mouth 2 (two) times daily as needed (for pain). What  changed:  when to take this   glucose blood test strip Commonly known as:  EASY TALK BLOOD GLUCOSE TEST Use as instructed 2x a day   HUMIRA PEN-PS/UV/ADOL HS START 40 MG/0.8ML Pnkt Generic drug:  Adalimumab Inject 40 mg into the skin once a week.   hydrOXYzine 25 MG tablet Commonly known as:  ATARAX/VISTARIL Take 1 tablet (25 mg total) by mouth every 8 (eight) hours as needed for itching. What changed:  when to take this   metFORMIN 500 MG 24 hr tablet Commonly known as:  GLUCOPHAGE-XR TAKE 2 TABLETS(1000 MG) BY MOUTH DAILY WITH BREAKFAST   oxyCODONE 5 MG immediate release tablet Commonly known as:  Oxy IR/ROXICODONE Take 1 tablet (5 mg total) by mouth every 6 (six) hours as needed for moderate pain.   topiramate 50 MG tablet Commonly known as:  TOPAMAX Take 1 tablet (50 mg total) by mouth 2 (two) times daily.   venlafaxine XR 75 MG 24 hr capsule Commonly known as:  EFFEXOR-XR Take 1 capsule (75 mg total) by mouth daily with breakfast.   VENTOLIN HFA 108 (90 Base) MCG/ACT inhaler Generic drug:  albuterol Inhale 2 puffs into the lungs every 6 (six) hours as needed for wheezing or shortness of breath. 200/8=25 What changed:  See the new instructions.   VOLTAREN 1 % Gel Generic drug:  diclofenac sodium Apply 2 g topically 2 (two) times daily as needed (for pain). 100/4=25 What changed:  See the new instructions.   XARELTO 20 MG Tabs tablet Generic drug:  rivaroxaban Take 1 tablet (20 mg total) by mouth at bedtime.      Follow-up Information    Donnie Mesa, MD Follow up.   Specialty:  General Surgery Contact information: [address] [place] [address] [phone]           Signed: Maia Petties 02/12/2018, 9:05 AM

## 2018-02-12 NOTE — Discharge Instructions (Signed)
[facility] ______[facility] °LAPAROSCOPIC SURGERY: POST OP INSTRUCTIONS °Always review your discharge instruction sheet given to you by the facility where your surgery was performed. °IF YOU HAVE DISABILITY OR FAMILY LEAVE FORMS, YOU MUST BRING THEM TO THE OFFICE FOR PROCESSING.   °DO NOT GIVE THEM TO YOUR DOCTOR. ° °1. A prescription for pain medication may be given to you upon discharge.  Take your pain medication as prescribed, if needed.  If narcotic pain medicine is not needed, then you may take acetaminophen (Tylenol) or ibuprofen (Advil) as needed. °2. Take your usually prescribed medications unless otherwise directed. °3. If you need a refill on your pain medication, please contact your pharmacy.  They will contact our office to request authorization. Prescriptions will not be filled after 5pm or on week-ends. °4. You should follow a light diet the first few days after arrival home, such as soup and crackers, etc.  Be sure to include lots of fluids daily. °5. Most patients will experience some swelling and bruising in the area of the incisions.  Ice packs will help.  Swelling and bruising can take several days to resolve.  °6. It is common to experience some constipation if taking pain medication after surgery.  Increasing fluid intake and taking a stool softener (such as Colace) will usually help or prevent this problem from occurring.  A mild laxative (Milk of Magnesia or Miralax) should be taken according to package instructions if there are no bowel movements after 48 hours. °7. Unless discharge instructions indicate otherwise, you may remove your bandages 24-48 hours after surgery, and you may shower at that time.  You may have steri-strips (small skin tapes) in place directly over the incision.  These strips should be left on the skin for 7-10 days.  If your surgeon used skin glue on the incision, you may shower in 24 hours.  The glue will flake off over the next 2-3 weeks.  Any sutures or  staples will be removed at the office during your follow-up visit. °8. ACTIVITIES:  You may resume regular (light) daily activities beginning the next day--such as daily self-care, walking, climbing stairs--gradually increasing activities as tolerated.  You may have sexual intercourse when it is comfortable.  Refrain from any heavy lifting or straining until approved by your doctor. °a. You may drive when you are no longer taking prescription pain medication, you can comfortably wear a seatbelt, and you can safely maneuver your car and apply brakes. °b. RETURN TO WORK:  __________________________________________________________ °9. You should see your doctor in the office for a follow-up appointment approximately 2-3 weeks after your surgery.  Make sure that you call for this appointment within a day or two after you arrive home to insure a convenient appointment time. °10. OTHER INSTRUCTIONS: __________________________________________________________________________________________________________________________ __________________________________________________________________________________________________________________________ °WHEN TO CALL YOUR DOCTOR: °1. Fever over 101.0 °2. Inability to urinate °3. Continued bleeding from incision. °4. Increased pain, redness, or drainage from the incision. °5. Increasing abdominal pain ° °The clinic staff is available to answer your questions during regular business hours.  Please don’t hesitate to call and ask to speak to one of the nurses for clinical concerns.  If you have a medical emergency, go to the nearest emergency room or call 911.  A surgeon from [facility] is always on call at the hospital. °[address], [address], [place], [place]  [address] ? [address] [address], [place], [place]   [address] °[phone] ? [phone] ? FAX [phone] °Web site:   [email or web address] °

## 2018-02-13 ENCOUNTER — Other Ambulatory Visit: Payer: Self-pay | Admitting: *Deleted

## 2018-02-13 NOTE — Patient Outreach (Signed)
[facility]) Care Management  02/13/2018  [name] [facility] [birth year] [id number]   Noted that member was admitted to hospital for lap cholecystectomy, discharged yesterday.  Call placed to follow up on surgery.  She state she is in a lot of pain and trying to rest.  She has not received any pain medication between discharge and now as pharmacy did not deliver it until today, just took first dose.  She has yet to schedule appointment follow up with surgeon.  State she will rest a bit and then call office.  Advised to contact this care manager with concerns/questions.  Will follow up within the next week as member does not feel up to completing conversation today.  Valente David, [place], MSN [place] [phone]

## 2018-02-16 ENCOUNTER — Telehealth: Payer: Self-pay | Admitting: *Deleted

## 2018-02-16 NOTE — Telephone Encounter (Signed)
Pt was on PATIENT PING REPORT admitted 02/11/18 for Chronic cholecystitis, Morbid obesity, and COPD. Pt had a  Laparoscopic cholecystectomy with IOC on 02/11/18.  She stayed overnight because of her cardiac and pulmonary comorbidities.  Minimal pain overnight.  No nausea. Pt D/C 02/12/18, and will follow-up w/surgeon Dr. Georgette Dover.Marland KitchenJohny Chess

## 2018-02-17 ENCOUNTER — Other Ambulatory Visit: Payer: Self-pay | Admitting: *Deleted

## 2018-02-17 DIAGNOSIS — I5030 Unspecified diastolic (congestive) heart failure: Secondary | ICD-10-CM | POA: Diagnosis not present

## 2018-02-17 DIAGNOSIS — N182 Chronic kidney disease, stage 2 (mild): Secondary | ICD-10-CM | POA: Diagnosis not present

## 2018-02-17 DIAGNOSIS — Z96653 Presence of artificial knee joint, bilateral: Secondary | ICD-10-CM | POA: Diagnosis not present

## 2018-02-17 DIAGNOSIS — Z6841 Body Mass Index (BMI) 40.0 and over, adult: Secondary | ICD-10-CM | POA: Diagnosis not present

## 2018-02-17 DIAGNOSIS — Z7984 Long term (current) use of oral hypoglycemic drugs: Secondary | ICD-10-CM | POA: Diagnosis not present

## 2018-02-17 DIAGNOSIS — G4733 Obstructive sleep apnea (adult) (pediatric): Secondary | ICD-10-CM | POA: Diagnosis not present

## 2018-02-17 DIAGNOSIS — F419 Anxiety disorder, unspecified: Secondary | ICD-10-CM | POA: Diagnosis not present

## 2018-02-17 DIAGNOSIS — F329 Major depressive disorder, single episode, unspecified: Secondary | ICD-10-CM | POA: Diagnosis not present

## 2018-02-17 DIAGNOSIS — Z434 Encounter for attention to other artificial openings of digestive tract: Secondary | ICD-10-CM | POA: Diagnosis not present

## 2018-02-17 DIAGNOSIS — M797 Fibromyalgia: Secondary | ICD-10-CM | POA: Diagnosis not present

## 2018-02-17 DIAGNOSIS — Z9981 Dependence on supplemental oxygen: Secondary | ICD-10-CM | POA: Diagnosis not present

## 2018-02-17 DIAGNOSIS — E441 Mild protein-calorie malnutrition: Secondary | ICD-10-CM | POA: Diagnosis not present

## 2018-02-17 DIAGNOSIS — D869 Sarcoidosis, unspecified: Secondary | ICD-10-CM | POA: Diagnosis not present

## 2018-02-17 DIAGNOSIS — K219 Gastro-esophageal reflux disease without esophagitis: Secondary | ICD-10-CM | POA: Diagnosis not present

## 2018-02-17 DIAGNOSIS — D519 Vitamin B12 deficiency anemia, unspecified: Secondary | ICD-10-CM | POA: Diagnosis not present

## 2018-02-17 DIAGNOSIS — E1122 Type 2 diabetes mellitus with diabetic chronic kidney disease: Secondary | ICD-10-CM | POA: Diagnosis not present

## 2018-02-17 DIAGNOSIS — M1990 Unspecified osteoarthritis, unspecified site: Secondary | ICD-10-CM | POA: Diagnosis not present

## 2018-02-17 DIAGNOSIS — Z7901 Long term (current) use of anticoagulants: Secondary | ICD-10-CM | POA: Diagnosis not present

## 2018-02-17 DIAGNOSIS — J9611 Chronic respiratory failure with hypoxia: Secondary | ICD-10-CM | POA: Diagnosis not present

## 2018-02-17 DIAGNOSIS — I130 Hypertensive heart and chronic kidney disease with heart failure and stage 1 through stage 4 chronic kidney disease, or unspecified chronic kidney disease: Secondary | ICD-10-CM | POA: Diagnosis not present

## 2018-02-17 DIAGNOSIS — M069 Rheumatoid arthritis, unspecified: Secondary | ICD-10-CM | POA: Diagnosis not present

## 2018-02-17 DIAGNOSIS — K819 Cholecystitis, unspecified: Secondary | ICD-10-CM | POA: Diagnosis not present

## 2018-02-17 DIAGNOSIS — J449 Chronic obstructive pulmonary disease, unspecified: Secondary | ICD-10-CM | POA: Diagnosis not present

## 2018-02-17 DIAGNOSIS — I2510 Atherosclerotic heart disease of native coronary artery without angina pectoris: Secondary | ICD-10-CM | POA: Diagnosis not present

## 2018-02-17 NOTE — Patient Outreach (Signed)
[facility]) Care Management  02/17/2018  [name] [facility] [birth year] [id number]   Call placed to member to follow up on unresolved pain.  She report she was provided with pain medications last week but was only given 20 tablets, order was to take 1 tablet up to 4 times a day.  State she has ran out but is still experiencing pain.  She has notified the surgery office to report pain.  They will refill medication, but she will have to pick up prescription from office.  This is a problem due to no transportation.  [facility] arrived at home during home visit but they are unable to get prescription from office.  Member agrees to have this care manager obtain and deliver to the home tomorrow.  She will call MD office to provide update and name of this care manager.  Denies any other concerns at this time.  Valente David, [place], MSN [place] [phone]

## 2018-02-19 ENCOUNTER — Other Ambulatory Visit: Payer: Self-pay | Admitting: *Deleted

## 2018-02-19 NOTE — Patient Outreach (Signed)
[facility]) Care Management  02/18/2018  [name] [facility] 08-04-1960 [id number]   Prescription for pain medications received from [facility] office and hand delivered to member.  Upon delivery of prescription, call was placed to [facility] pharmacy to request pick up and fill.  Member continues to report pain, mainly on left side.  Denies any urgent concerns, will follow up within the next week.  Valente David, [place], MSN [place] [phone]

## 2018-02-23 MED ORDER — ADALIMUMAB PEN CITRATE FREE 40 MG/0.4 ML
11 refills | 0 days | Status: CP
Start: 2018-02-23 — End: 2018-02-26

## 2018-02-24 ENCOUNTER — Other Ambulatory Visit: Payer: Self-pay | Admitting: Family

## 2018-02-24 DIAGNOSIS — K819 Cholecystitis, unspecified: Secondary | ICD-10-CM | POA: Diagnosis not present

## 2018-02-24 DIAGNOSIS — Z434 Encounter for attention to other artificial openings of digestive tract: Secondary | ICD-10-CM | POA: Diagnosis not present

## 2018-02-24 DIAGNOSIS — J9611 Chronic respiratory failure with hypoxia: Secondary | ICD-10-CM | POA: Diagnosis not present

## 2018-02-24 DIAGNOSIS — I130 Hypertensive heart and chronic kidney disease with heart failure and stage 1 through stage 4 chronic kidney disease, or unspecified chronic kidney disease: Secondary | ICD-10-CM | POA: Diagnosis not present

## 2018-02-24 DIAGNOSIS — I5030 Unspecified diastolic (congestive) heart failure: Secondary | ICD-10-CM | POA: Diagnosis not present

## 2018-02-24 DIAGNOSIS — J449 Chronic obstructive pulmonary disease, unspecified: Secondary | ICD-10-CM | POA: Diagnosis not present

## 2018-02-26 MED ORDER — ADALIMUMAB PEN CITRATE FREE 40 MG/0.4 ML
11 refills | 0 days | Status: CP
Start: 2018-02-26 — End: ?
  Filled 2018-03-12: qty 4, 28d supply, fill #0

## 2018-02-26 NOTE — Unmapped (Signed)
Per test claim for Humira at the [facility], patient needs Medication Assistance Program for Prior Authorization.

## 2018-03-02 ENCOUNTER — Other Ambulatory Visit: Payer: Self-pay | Admitting: *Deleted

## 2018-03-02 NOTE — Unmapped (Signed)
[facility]   Patient Onboarding/Medication Counseling    Ms.Witting is a 58 y.o. female with hidradenitis suppurativa who I am counseling today on continuation of therapy.  I am speaking to the patient.    Verified patient's date of birth / HIPAA.    Specialty medication(s) to be sent: Inflammatory Disorders: Humira      Non-specialty medications/supplies to be sent: Sharps container      Medications not needed at this time: N/A         Humira (adalimumab)    Medication & Administration     Dosage: Hydradenitis Suppurativa - patient has been on and is currently on maintenance dose of 40mg  weekly    Lab tests required prior to treatment initiation:  ? Tuberculosis: Tuberculosis screening resulted in a non-reactive Quantiferon TB Gold assay.  ? Hepatitis B: Hepatitis B serology studies are complete and non-reactive.    Administration:     Prefilled syringe  1. Gather all supplies needed for injection on a clean, flat working surface: medication syringe(s) removed from packaging, alcohol swab, sharps container, etc.  2. Look at the medication label ??? look for correct medication, correct dose, and check the expiration date  3. Look at the medication ??? the liquid in the syringe should appear clear and colorless  4. Lay the syringe on a flat surface and allow it to warm up to room temperature for at least 30-45 minutes  5. Select injection site ??? you can use the front of your thigh or your belly (but not the area 2 inches around your belly button)  6. Prepare injection site ??? wash your hands and clean the skin at the injection site with an alcohol swab and let it air dry, do not touch the injection site again before the injection  7. Pull off the needle safety cap, do not remove until immediately prior to injection; turn the syringe so the needle is facing up and hold the syringe at eye level with one hand so you can see the air in the syringe; using your other hand, slowly push the plunger in to push the air out through the needle  8. Pinch the skin ??? with your hand not holding the syringe pinch up a fold of skin at the injection site using your forefinger and thumb  9. Insert the needle into the fold of skin at about a 45 degree angle ??? it's best to use a quick dart-like motion  10. Push the plunger down slowly as far as it will go until the syringe is empty, hold the syringe in place for a full 5 seconds  11. Check that the syringe is empty and pull the needle out at the same angle as inserted  12. Dispose of the used syringe immediately in your sharps disposal container, do not attempt to recap the needle prior to disposing  13. If you see any blood at the injection site, press a cotton ball or gauze on the site and maintain pressure until the bleeding stops, do not rub the injection site      Prefilled auto-injector pen  1. Gather all supplies needed for injection on a clean, flat working surface: medication pen removed from packaging, alcohol swab, sharps container, etc.  2. Look at the medication label ??? look for correct medication, correct dose, and check the expiration date  3. Look at the medication ??? the liquid visible in the window on the side of the pen device should  appear clear and colorless  4. Lay the auto-injector pen on a flat surface and allow it to warm up to room temperature for at least 30-45 minutes  5. Select injection site ??? you can use the front of your thigh or your belly (but not the area 2 inches around your belly button); if someone else is giving you the injection you can also use your upper arm in the skin covering your triceps muscle  6. Prepare injection site ??? wash your hands and clean the skin at the injection site with an alcohol swab and let it air dry, do not touch the injection site again before the injection  7. Pull the 2 safety caps straight off ??? gray/white to uncover the needle cover and the plum cap to uncover the plum activator button, do not remove until immediately prior to injection and do not touch the white needle cover  8. Gently squeeze the area of cleaned skin and hold it firmly to create a firm surface at the selected injection site  9. Put the white needle cover against your skin at the injection site at a 90 degree angle, hold the pen such that you can see the clear medication window  10. Press down and hold the pen firmly against your skin, press the plum activator button to initiate the injection, there will be a click when the injection starts  11. Continue to hold the pen firmly against your skin for about 10-15 seconds ??? the window will start to turn solid yellow  12. To verify the injection is complete after 10-15 seconds, look and ensure the window is solid yellow and then pull the pen away from your skin  13. Dispose of the used auto-injector pen immediately in your sharps disposal container the needle will be covered automatically  14. If you see any blood at the injection site, press a cotton ball or gauze on the site and maintain pressure until the bleeding stops, do not rub the injection site      Adherence/Missed dose instructions:  If your injection is given more than 3 days after your scheduled injection date ??? consult your pharmacist for additional instructions on how to adjust your dosing schedule.    Goals of Therapy         Hidradenitis supurativa  ? Reduce the frequency and severity of new lesions  ? Minimize pain and suppuration  ? Prevent disease progression and limit scarring  ? Maintenance of effective psychosocial functioning      Side Effects & Monitoring Parameters     ? Injection site reaction (redness, irritation, inflammation localized to the site of administration)  ? Signs of a common cold ??? minor sore throat, runny or stuffy nose, etc.  ? Upset stomach  ? Headache    The following side effects should be reported to the provider:  ? Signs of a hypersensitivity reaction ??? rash; hives; itching; red, swollen, blistered, or peeling skin; wheezing; tightness in the chest or throat; difficulty breathing, swallowing, or talking; swelling of the mouth, face, lips, tongue, or throat; etc.  ? Reduced immune function ??? report signs of infection such as fever; chills; body aches; very bad sore throat; ear or sinus pain; cough; more sputum or change in color of sputum; pain with passing urine; wound that will not heal, etc.  Also at a slightly higher risk of some malignancies (mainly skin and blood cancers) due to this reduced immune function.  o In the case of  signs of infection ??? the patient should hold the next dose of Humira?? and call your primary care provider to ensure adequate medical care.  Treatment may be resumed when infection is treated and patient is asymptomatic.  ? Changes in skin ??? a new growth or lump that forms; changes in shape, size, or color of a previous mole or marking  ? Signs of unexplained bruising or bleeding ??? throwing up blood or emesis that looks like coffee grounds; black, tarry, or bloody stool; etc.  ? Signs of new or worsening heart failure ??? shortness of breath; sudden weight gain; heartbeat that is not normal; swelling in the arms or legs that is new or worse      Contraindications, Warnings, & Precautions     ? Have your bloodwork checked as you have been told by your prescriber  ? Talk with your doctor if you are pregnant, planning to become pregnant, or breastfeeding  ? Discuss the possible need for holding your dose(s) of Humira?? when a planned procedure is scheduled with the prescriber as it may delay healing/recovery timeline       Drug/Food Interactions     ? Medication list reviewed in Epic. The patient was instructed to inform the care team before taking any new medications or supplements. No drug interactions identified.   ? Talk with you prescriber or pharmacist before receiving any live vaccinations while taking this medication and after you stop taking it    Storage, Handling Precautions, & Disposal     ? Store this medication in the refrigerator.  Do not freeze  ? If needed, you may store at room temperature for up to 14 days  ? Store in [employer], protected from light  ? Do not shake  ? Dispose of used syringes/pens in a sharps disposal container              Current Medications (including OTC/herbals), Comorbidities and Allergies     Current Outpatient Medications   Medication Sig Dispense Refill   ??? ADALIMUMAB PEN CITRATE FREE 40 MG/0.4 ML Inject the contents of 1 pen (40 mg) under the skin weekly 4 each 11   ??? albuterol (PROVENTIL HFA;VENTOLIN HFA) 90 mcg/actuation inhaler Inhale 2 puffs.     ??? azithromycin (ZITHROMAX) 250 MG tablet Zpack taper as directed  0   ??? blood sugar diagnostic (FREESTYLE LITE STRIPS) Strp TEST TWICE DAILY     ??? blood-glucose meter (FREESTYLE LITE METER) kit Use to check blood sugar 2 times per day     ??? carvedilol (COREG) 12.5 MG tablet TAKE 1 TABLET BY MOUTH TWICE DAILY WITH A MEAL     ??? chlorhexidine (PERIDEX) 0.12 % solution 5 mL.     ??? clobetasol (TEMOVATE) 0.05 % ointment Apply topically Two (2) times a day. 60 g 5   ??? clonazePAM (KLONOPIN) 0.5 MG tablet      ??? diclofenac sodium (VOLTAREN) 1 % gel Apply 2 g topically 2 (two) times daily as needed (for pain). 100/4=25     ??? docusate sodium (COLACE) 100 MG capsule Take 100 mg by mouth.     ??? escitalopram oxalate (LEXAPRO) 20 MG tablet TAKE 1 TABLET BY MOUTH DAILY     ??? esomeprazole (NEXIUM) 40 MG capsule Take 40 mg by mouth.     ??? fluticasone propionate (FLONASE) 50 mcg/actuation nasal spray 1 spray into each nostril.     ??? furosemide (LASIX) 20 MG tablet Take 20 mg by mouth.     ???  gabapentin (NEURONTIN) 300 MG capsule Take 1 capsule (300 mg total) by mouth 2 (two) times daily as needed (for pain).  1   ??? hydrOXYzine (ATARAX) 25 MG tablet Take 25 mg by mouth.     ??? lancets (FREESTYLE) 28 gauge Misc 1 each.     ??? levoFLOXacin (LEVAQUIN) 750 MG tablet Take 750 mg by mouth.     ??? LIDOCAINE 2 % solution      ??? metFORMIN (GLUCOPHAGE) 500 MG tablet TAKE 2 TABLETS(1000 MG) BY MOUTH DAILY WITH FOOD     ??? methylPREDNISolone (MEDROL DOSEPACK) 4 mg tablet As directed po     ??? mupirocin (BACTROBAN) 2 % ointment Apply topically.     ??? nortriptyline (PAMELOR) 75 MG capsule Take 75 mg by mouth.     ??? nystatin (MYCOSTATIN) 100,000 unit/mL suspension      ??? omeprazole (PRILOSEC) 20 MG capsule Take 20 mg by mouth.     ??? potassium chloride (KLOR-CON 10) 10 MEQ CR tablet TAKE 1 TABLET BY MOUTH DAILY     ??? predniSONE (DELTASONE) 10 MG tablet Take 1 tablet daily with food until next visit.     ??? rivaroxaban (XARELTO) 20 mg tablet TAKE 1 TABLET BY MOUTH EVERY DAY WITH SUPPER     ??? suvorexant (BELSOMRA) 20 mg tablet Take 20 mg by mouth.     ??? SYMBICORT 160-4.5 mcg/actuation inhaler Inhale 2 puffs into the lungs TWICE DAILY 120/4=30  5   ??? topiramate (TOPAMAX) 50 MG tablet TAKE 1 TABLET BY MOUTH TWICE DAILY     ??? venlafaxine (EFFEXOR-XR) 75 MG 24 hr capsule Take 75 mg by mouth.       Current Facility-Administered Medications   Medication Dose Route Frequency Provider Last Rate Last Dose   ??? triamcinolone acetonide (KENALOG) injection 10 mg  10 mg Intradermal Once Elsie Stain, MD           Allergies   Allergen Reactions   ??? Other Other (See Comments)     Patient has Sarcoidosis and can't tolerate any metals   ??? Suvorexant Other (See Comments)     Larey Seat out of bed while asleep: I'm waking up as I'm falling on the floor; Night terrors   ??? Enalapril Maleate      REACTION: cough   ??? Hydrochlorothiazide      REACTION: Low potassium   ??? Hydroxychloroquine Sulfate      REACTION: vision changes   ??? Iodine      Mother was intolerant--CODED on CT table---pt never tired.   ??? Latex      Hives   ??? Nickel Other (See Comments)     Pt has a titanium right and left knee - nickel causes skin irritations that forms into blisters and sores  Other reaction(s): Other (See Comments)  Pt has a titanium right knee - nickel causes skin irritations that forms into blisters and sores   ??? Adhesive Tape-Silicones Other (See Comments)     Medical tape causes bruising   ??? Doxycycline Diarrhea and Nausea And Vomiting   ??? Pregabalin Other (See Comments)     Made me feel high       Patient Active Problem List   Diagnosis   ??? Sarcoidosis       Reviewed and up to date in Epic.    Appropriateness of Therapy     Is medication and dose appropriate based on diagnosis? Yes    Baseline Quality of Life Assessment  How many days over the past month did your hidradenitis suppurativa keep you from your normal activities? 0    Financial Information     Medication Assistance provided: Prior Authorization    Anticipated copay of $3.90 reviewed with patient. Verified delivery address.    Delivery Information     Scheduled delivery date: 03/13/18    Expected start date: 03/13/18    Medication will be delivered via UPS to the home address in [facility].  This shipment will require a signature.      Explained the services we provide at [facility] and that each month we would call to set up refills.  Stressed importance of returning phone calls so that we could ensure they receive their medications in time each month.  Informed patient that we should be setting up refills 7-10 days prior to when they will run out of medication.  A pharmacist will reach out to perform a clinical assessment periodically.  Informed patient that a welcome packet and a drug information handout will be sent.      Patient verbalized understanding of the above information as well as how to contact the pharmacy at [phone] option 4 with any questions/concerns.  The pharmacy is open Monday through Friday 8:30am-4:30pm.  A pharmacist is available 24/7 via pager to answer any clinical questions they may have.    Patient Specific Needs     ? Does the patient have any physical, cognitive, or cultural barriers? Patient stated she has sarcoidosis and sometimes it is difficult for her to get around    ? Patient prefers to have medications discussed with  Patient     ? Is the patient able to read and understand education materials at a high school level or above? Yes    ? Patient's primary language is  English     ? Is the patient high risk? No     ? Does the patient require a Care Management Plan? No     ? Does the patient require physician intervention or other additional services (i.e. nutrition, smoking cessation, social work)? No      Mervyn Gay  [facility] Specialty Pharmacist

## 2018-03-02 NOTE — Unmapped (Signed)
[facility] Specialty Medication Referral: PA Approved      Medication (Brand/Generic): Humira    Final Test Claim completed with resulted information below:    Patient ABLE to fill at [facility]  Insurance Company:  [employer]  Anticipated Copay: $3.90  Is anticipated copay with a copay card or grant? No    Does patient's insurance plan only allow a 15 day supply for the first 6 fills in the Split Fill Program? No  If yes, inform patient they can request to dis-enroll from the [facility] by calling the patient help desk at N/A.      If the copay is under the $25 defined limit, per policy there will be no further investigation of need for financial assistance at this time unless patient requests. This referral has been communicated to the provider and handed off to the [facility] team for further processing and filling of prescribed medication.   ______________________________________________________________________  Please utilize this referral for viewing purposes as it will serve as the central location for all relevant documentation and updates.

## 2018-03-02 NOTE — Patient Outreach (Signed)
[facility]) Care Management  03/02/2018  Shyvonne Chastang [facility] Oct 05, 1960 [id number]   Call placed to member to follow up on pain control post surgery.  No answer, HIPAA compliant voice message left.  Will follow up within the next 4 business days.  Valente David, [place], MSN [place] [phone]

## 2018-03-03 ENCOUNTER — Other Ambulatory Visit: Payer: Self-pay

## 2018-03-03 ENCOUNTER — Ambulatory Visit (INDEPENDENT_AMBULATORY_CARE_PROVIDER_SITE_OTHER): Payer: Medicare Other | Admitting: Internal Medicine

## 2018-03-03 ENCOUNTER — Encounter: Payer: Self-pay | Admitting: Internal Medicine

## 2018-03-03 VITALS — BP 120/82 | HR 78 | Ht 67.0 in | Wt 264.0 lb

## 2018-03-03 DIAGNOSIS — E1122 Type 2 diabetes mellitus with diabetic chronic kidney disease: Secondary | ICD-10-CM

## 2018-03-03 DIAGNOSIS — E669 Obesity, unspecified: Secondary | ICD-10-CM

## 2018-03-03 DIAGNOSIS — E785 Hyperlipidemia, unspecified: Secondary | ICD-10-CM

## 2018-03-03 DIAGNOSIS — N182 Chronic kidney disease, stage 2 (mild): Principal | ICD-10-CM

## 2018-03-03 DIAGNOSIS — N189 Chronic kidney disease, unspecified: Secondary | ICD-10-CM | POA: Diagnosis not present

## 2018-03-03 MED ORDER — GLUCOSE BLOOD VI STRP: ORAL_STRIP | 12 refills | Status: DC

## 2018-03-03 MED ORDER — BD LANCET ULTRAFINE 33G MISC: 12 refills | Status: DC

## 2018-03-03 NOTE — Progress Notes (Signed)
Patient ID: [name], female   DOB: [birth year], 58 y.o.   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 She previously saw Dr. Loanne Garrison.  Last visit with me 4 months ago.  She was previously on insulin between 2013 and 2015, probably due to prednisone treatment for sarcoidosis.  She was on Humira >> but does not qualify for the pgm anymore >> now off. She was on several 5-7 day tapers  - last 01/2018.  She was admitted with transaminitis 09/2017 along with RUQ pain and pruritus.  She had lap cholecystectomy 02/11/2018.  She feels well after the surgery, with no complaints other than insomnia.  Latest HbA1c level was reviewed: Lab Results  Component Value Date   HGBA1C 5.9 (H) 02/05/2018   HGBA1C 5.2 10/28/2017   HGBA1C 5.8 (H) 06/26/2017   HGBA1C 6.7 (H) 04/02/2017   HGBA1C 7.1 (H) 07/22/2016   HGBA1C 5.9 01/30/2016   HGBA1C 5.8 (H) 07/04/2015   HGBA1C 6.0 04/03/2015   HGBA1C 5.8 02/01/2015   HGBA1C 6.2 06/28/2014   HGBA1C 6.8 (H) 03/24/2014   HGBA1C 6.8 (H) 08/02/2013   HGBA1C 6.4 04/27/2013   HGBA1C 7.3 (H) 01/27/2013   HGBA1C 7.1 (H) 09/17/2012   HGBA1C 6.7 (H) 06/02/2012   HGBA1C 6.1 03/03/2012   HGBA1C 6.1 11/26/2011   HGBA1C 16.4 (H) 05/09/2011   HGBA1C 7.3 (H) 10/10/2010   Pt is on a regimen of: - Metformin ER 1000 mg in a.m. She was previously on Januvia 50 mg, stopped at 10/2017  Pt is still not checking sugars. From last OV: - am: 120-125 >> 115-124 - 2h after b'fast: n/c >> 154 - before lunch: n/c - 2h after lunch: n/c - before dinner: n/c - 2h after dinner: n/c - bedtime: 135-140 >> 135-150 - nighttime: n/c Lowest sugar was 105 >> 115 >> ?; it is unclear at which level she has hypoglycemia awareness Highest sugar  was 150 >> 154 >> ?[name]  Glucometer: AccuChek >> left it in [place].  Pt's meals are: - Breakfast: watermelon; cereal + 2% milk; coffee; apple sauce or peach cup; or grits - Lunch: may skip; sandwich or soup - Dinner: cereal or sandwich or baked chicken + veggies (greens, corn) + rice or spaghetti >> salmon/organic chicken breast + broccoli/green beans + starch - Snacks: popcorn  -+ History of CKD, improved, last BUN/creatinine:  Lab Results  Component Value Date   BUN 15 02/12/2018   BUN 14 02/05/2018   CREATININE 0.96 02/12/2018   CREATININE 0.93 02/11/2018   -+ HL; last set of lipids: Lab Results  Component Value Date   CHOL 237 (H) 06/30/2017   HDL 70.00 06/30/2017   LDLCALC 97 06/29/2008   LDLDIRECT 107.0 06/30/2017   TRIG 271.0 (H) 06/30/2017   CHOLHDL 3 06/30/2017  I did recommend pravastatin in the past but did not start - last eye exam in 2016: No DR. She had an eye infection in 06/2017 >> had sx. Could not afford the copay >> hopes to be able to go in 03/2018. - no numbness and tingling in her feet.   Pt has FH of DM in mother, sister.  She has lichen planus on legs.  ROS: Constitutional: + weight gain/no weight loss, + fatigue, no subjective hyperthermia, no subjective hypothermia, + nocturia Eyes: +  blurry vision, no xerophthalmia ENT: + sore throat, no nodules palpated in neck, no dysphagia, + odynophagia, no hoarseness Cardiovascular: no CP/+ SOB/no palpitations/no leg swelling Respiratory: no cough/+ SOB/no wheezing Gastrointestinal: no N/no V/no D/no C/no acid reflux Musculoskeletal: + muscle aches/+ joint aches Skin: no rashes, + hair loss Neurological: no tremors/no numbness/no tingling/no dizziness  I reviewed pt's medications, allergies, PMH, social hx, family hx, and changes were documented in the history of present illness. Otherwise, unchanged from my initial visit note.  Past Medical History:  Diagnosis Date  . Allergic rhinitis   . ALLERGIC  RHINITIS 10/26/2009  . Anxiety   . ANXIETY 08/14/2006  . B12 DEFICIENCY 08/25/2007  . Complication of anesthesia    pt has had difficulty following anesthesia with her knee in 2016-unable to care for herself afterward  . Confusion   . Depression    takes Lexapro daily  . Diabetes mellitus without complication ([place])    was on insulin but has been off since Nov 2015 and now only takes Metformin daily  . DYSPNEA 04/28/2009   with exertion  . Esophageal reflux    takes Nexium daily  . Fibromyalgia   . Headache    last migraine 2-88yrs ago;takes Topamax daily  . History of shingles   . Hypertension    takes Coreg daily  . Insomnia    takes Nortriptyline nightly   . Joint pain   . Joint swelling   . Left knee pain   . Lichen planus   . Long-term memory loss   . Nocturia   . OSA (obstructive sleep apnea)    doesn't use CPAP;sleep study in epic from 2006  . Osteoarthritis   . Osteoarthritis   . Pneumonia    over 30 yrs ago  . Protein calorie malnutrition ([place])   . Rheumatoid arthritis ([place])   . Sarcoidosis    Dr. Lolita Patella  . Short-term memory loss   . Shortness of breath   . TIA (transient ischemic attack)   . Unsteady gait   . Urinary urgency   . Vitamin D deficiency    is supposed to take Vit D but can't afford it  . VITAMIN D DEFICIENCY 08/25/2007   Past Surgical History:  Procedure Laterality Date  . APPENDECTOMY    . arthroscopic knee surgery Right 11-12-04  . AXILLARY ABCESS IRRIGATION AND DEBRIDEMENT  Jul & Aug2012  . CARPAL TUNNEL RELEASE Left 05/23/2014   Procedure: CARPAL TUNNEL RELEASE;  Surgeon: Meredith Pel, MD;  Location: [facility];  Service: Orthopedics;  Laterality: Left;  . CHOLECYSTECTOMY  02/11/2018   LAPROSCOPIC  . CHOLECYSTECTOMY N/A 02/11/2018   Procedure: LAPAROSCOPIC CHOLECYSTECTOMY WITH INTRAOPERATIVE CHOLANGIOGRAM ERAS PATHWAY;  Surgeon: Donnie Mesa, MD;  Location: [facility];  Service: General;  Laterality: N/A;  . cyst removed from top of  buttocks  at age 58  . ENDOMETRIAL ABLATION    . IR CHOLANGIOGRAM EXISTING TUBE  11/21/2017  . IR CHOLANGIOGRAM EXISTING TUBE  01/16/2018  . IR EXCHANGE BILIARY DRAIN  11/10/2017  . IR PATIENT EVAL TECH 0-60 MINS  02/03/2018  . IR PERC CHOLECYSTOSTOMY  09/13/2017  . IR RADIOLOGIST EVAL & MGMT  10/08/2017  . LACRIMAL DUCT EXPLORATION Right 06/26/2017   Procedure: LACRIMAL DUCT EXPLORATION AND ETHMOIDECTOMY;  Surgeon: Clista Bernhardt, MD;  Location: [facility];  Service: Ophthalmology;  Laterality: Right;  . TEAR DUCT PROBING Right 06/26/2017   Procedure: TEAR DUCT PROBING WITH STENT;  Surgeon: Clista Bernhardt, MD;  Location:  [facility];  Service: Ophthalmology;  Laterality: Right;  . TOTAL KNEE ARTHROPLASTY Right 11/15/2014   Procedure: TOTAL RIGHT KNEE ARTHROPLASTY;  Surgeon: Meredith Pel, MD;  Location: [facility];  Service: Orthopedics;  Laterality: Right;  . TOTAL KNEE ARTHROPLASTY Left 07/13/2015   Procedure: LEFT TOTAL KNEE ARTHROPLASTY;  Surgeon: Meredith Pel, MD;  Location: [facility];  Service: Orthopedics;  Laterality: Left;   Social History   Socioeconomic History  . Marital status: Single    Spouse name: Not on file  . Number of children: 1  . Years of education: Not on file  . Highest education level: Not on file  Occupational History  . Occupation: disabled  Social Needs  . Financial resource strain: Somewhat hard  . Food insecurity:    Worry: Sometimes true    Inability: Sometimes true  . Transportation needs:    Medical: No    Non-medical: Yes  Tobacco Use  . Smoking status: Former Smoker    Packs/day: 0.50    Years: 10.00    Pack years: 5.00    Types: Cigarettes  . Smokeless tobacco: Never Used  . Tobacco comment: quit smoking in 2004  Substance and Sexual Activity  . Alcohol use: No  . Drug use: No  . Sexual activity: Not Currently  Lifestyle  . Physical activity:    Days per week: 0 days    Minutes per session: 0 min  . Stress: To some extent  Relationships   . Social connections:    Talks on phone: More than three times a week    Gets together: Once a week    Attends religious service: 1 to 4 times per year    Active member of club or organization: No    Attends meetings of clubs or organizations: Never    Relationship status: Not on file  . Intimate partner violence:    Fear of current or ex partner: Not on file    Emotionally abused: Not on file    Physically abused: Not on file    Forced sexual activity: Not on file  Other Topics Concern  . Not on file  Social History Narrative   Single, broke up with partner in 2008.   Current Outpatient Medications on File Prior to Visit  Medication Sig Dispense Refill  . Adalimumab (HUMIRA PEN-PS/UV/ADOL HS START) 40 MG/0.8ML PNKT Inject 40 mg into the skin once a week.    [name] augmented betamethasone dipropionate (DIPROLENE-AF) 0.05 % ointment APPLY TWICE DAILY 45 g 1  . B-D ULTRA-FINE 33 LANCETS MISC Use as instructed to test sugars 4 times daily DX: E11.8 400 each 1  . budesonide-formoterol (SYMBICORT) 160-4.5 MCG/ACT inhaler Inhale 2 puffs into the lungs TWICE DAILY 120/4=30 10.2 g 0  . carvedilol (COREG) 12.5 MG tablet Take 1 tablet (12.5 mg total) by mouth 2 (two) times daily with a meal. 180 tablet 3  . clonazePAM (KLONOPIN) 0.5 MG tablet Take 1-2 tablets (0.5-1 mg total) by mouth at bedtime as needed for anxiety. 60 tablet 3  . esomeprazole (NEXIUM) 40 MG capsule Take 1 capsule (40 mg total) by mouth daily before breakfast. 90 capsule 3  . fluticasone (FLONASE) 50 MCG/ACT nasal spray Place 1 spray into both nostrils daily. (Patient taking differently: Place 1 spray into both nostrils daily as needed for allergies. ) 16 g 2  . furosemide (LASIX) 40 MG tablet TAKE 1 TABLET BY MOUTH EVERY MORNING FOR EDEMA (Patient taking differently: Take 40 mg by mouth  daily as needed for fluid or edema. ) 90 tablet 1  . gabapentin (NEURONTIN) 300 MG capsule Take 1 capsule (300 mg total) by mouth 2 (two) times  daily as needed (for pain). (Patient taking differently: Take 300 mg by mouth 2 (two) times daily. ) 180 capsule 1  . glucose blood (EASY TALK BLOOD GLUCOSE TEST) test strip Use as instructed 2x a day 200 each 12  . hydrOXYzine (ATARAX/VISTARIL) 25 MG tablet Take 1 tablet (25 mg total) by mouth every 8 (eight) hours as needed for itching. (Patient taking differently: Take 25 mg by mouth 2 (two) times daily. ) 60 tablet 1  . metFORMIN (GLUCOPHAGE-XR) 500 MG 24 hr tablet TAKE 2 TABLETS(1000 MG) BY MOUTH DAILY WITH BREAKFAST 180 tablet 2  . oxyCODONE (OXY IR/ROXICODONE) 5 MG immediate release tablet Take 1 tablet (5 mg total) by mouth every 6 (six) hours as needed for moderate pain. 20 tablet 0  . topiramate (TOPAMAX) 50 MG tablet Take 1 tablet (50 mg total) by mouth 2 (two) times daily. 180 tablet 1  . venlafaxine XR (EFFEXOR-XR) 75 MG 24 hr capsule Take 1 capsule (75 mg total) by mouth daily with breakfast. 30 capsule 11  . VENTOLIN HFA 108 (90 Base) MCG/ACT inhaler Inhale 2 puffs into the lungs every 6 (six) hours as needed for wheezing or shortness of breath. 200/8=25 (Patient taking differently: Inhale 2 puffs into the lungs every 6 (six) hours as needed for wheezing or shortness of breath. ) 18 g 11  . VOLTAREN 1 % GEL Apply 2 g topically 2 (two) times daily as needed (for pain). 100/4=25 (Patient taking differently: Apply 4 g topically 4 (four) times daily as needed (pain). ) 100 g 2  . XARELTO 20 MG TABS tablet Take 1 tablet (20 mg total) by mouth at bedtime. 90 tablet 3   No current facility-administered medications on file prior to visit.    Allergies  Allergen Reactions  . Belsomra [Suvorexant] Other (See Comments)    [place] out of bed while asleep: "I'm waking up as I'm falling on the floor;" "Night terrors"  . Enalapril Maleate Cough  . Latex Hives  . Nickel Other (See Comments)    Blisters  Pt has a titanium right and left knee - nickel causes skin irritations that form into blisters  and sores  . Other Other (See Comments)    Patient has Sarcoidosis and can't tolerate any metals  . Morphine And Related Itching and Nausea Only  . Doxycycline Diarrhea and Nausea And Vomiting  . Hydrochlorothiazide Other (See Comments)    Low potassium levels   . Hydroxychloroquine Sulfate Other (See Comments)    Vision changes   . Lyrica [Pregabalin] Other (See Comments)    "Made me feel high"  . Tape Other (See Comments)    Medical tape causes bruising   Family History  Problem Relation Age of Onset  . Heart disease Mother   . Kidney disease Mother   . Cancer Father        leukemia  . Hypertension Other   . Coronary artery disease Other        female 1st degree relative <60  . Heart failure Other        congestive  . Coronary artery disease Other        Female 1st degree relative <50  . Breast cancer Other        1st degree relative <50 S  . Breast cancer Sister  PE: BP 120/82   Pulse 78   Ht 5\' 7"  (1.702 m)   Wt 264 lb (119.7 kg)   LMP 05/19/2003   SpO2 97%   BMI 41.35 kg/m  Wt Readings from Last 3 Encounters:  03/03/18 264 lb (119.7 kg)  02/11/18 258 lb (117 kg)  02/05/18 262 lb 11.2 oz (119.2 kg)   Constitutional: overweight, in NAD, + on 2 L oxygen with walking Eyes: PERRLA, EOMI, no exophthalmos ENT: moist mucous membranes, no thyromegaly, no cervical lymphadenopathy Cardiovascular: RRR, No MRG Respiratory: CTA B Gastrointestinal: abdomen soft, NT, ND, BS+ Musculoskeletal: no deformities, strength intact in all 4 Skin: moist, warm, no rashes Neurological: no tremor with outstretched hands, DTR normal in all 4  ASSESSMENT: 1. DM2, non-insulin-dependent, controlled, with complications - h/o CKD - CAD - cerebrovascular ds - s/p TIA  2. HL  3.  Obesity  PLAN:  1. Patient with longstanding, previously uncontrolled but now better controlled diabetes, only on metformin.  We discussed at previous visits about improving her diet by decreasing fatty  foods and eating more fresh fruits and vegetables, whole grains, legumes.. -At last visit, sugars were excellent due to not being able to eat much due to her gallbladder problems.  At that time, HbA1c was 5.2%.  I advised her to stop Januvia then. -We reviewed together her latest HbA1c from last month: 5.9%, increased, but still excellent -There is no need to restart Januvia for now -I strongly advised her to restart checking sugars.  We gave her an Accu-Chek guide me meter and sent supplies to her pharmacy. - I suggested to:  Patient Instructions  Please continue: - Metformin ER 1000 mg in am  Please come back for a follow-up appointment in 6 months.  - continue checking sugars at different times of the day - check 1x a day, rotating checks - advised for yearly eye exams >> she is not UTD but plans to have another appointment in 03/2018 - Return to clinic in 6 mo with sugar log    2. HL - Reviewed latest lipid pane from 06/2017: LDL higher than target, as were the triglycerides.  HDL excellent: Lab Results  Component Value Date   CHOL 237 (H) 06/30/2017   HDL 70.00 06/30/2017   LDLCALC 97 06/29/2008   LDLDIRECT 107.0 06/30/2017   TRIG 271.0 (H) 06/30/2017   CHOLHDL 3 06/30/2017  -At last visit, we discussed about starting pravastatin but due to history of transaminitis and pruritus we did not add it yet.  3.  Obesity -She lost 20 pounds before last visit: In the context of not being able to eat well due to her gallbladder problems.  However, sugars were improved -She gained 10 pounds since last visit over the holidays  Philemon Kingdom, MD PhD [facility] Endocrinology

## 2018-03-03 NOTE — Patient Instructions (Signed)
Please continue: - Metformin ER 1000 mg in am  Please come back for a follow-up appointment in 6 months.

## 2018-03-06 ENCOUNTER — Other Ambulatory Visit: Payer: Self-pay | Admitting: *Deleted

## 2018-03-06 NOTE — Patient Outreach (Signed)
[facility]) Care Management  03/06/2018  [name] [facility] 04-03-1960 [id number]   Call placed to member to follow up on pain control post surgery.  She report her pain is now relieved but she is not sleeping, which has caused her to miss 2 appointments (one with surgeon and one with primary MD).  She has Clonazepam 0.5 - 1 mg and Hydroxyzine 25 mg, however she state she does not take as instructed.  She only take 1/2 the dose due to increased drowsiness when taking full dose.  She will try to take the full dose of the hydroxyzine to see if she is able to sleep better.  If not, she will take the full dose of the Clonazepam.  Advised to contact both the surgeon and primary MD to reschedule missed appointments.  She verbalizes understanding, will follow up within the next month.  Will assess the need for ongoing community involvement at that time.  If remain stable, will transition to health coach.  Valente David, [place], MSN [place] [phone]

## 2018-03-10 DIAGNOSIS — I130 Hypertensive heart and chronic kidney disease with heart failure and stage 1 through stage 4 chronic kidney disease, or unspecified chronic kidney disease: Secondary | ICD-10-CM | POA: Diagnosis not present

## 2018-03-10 DIAGNOSIS — J449 Chronic obstructive pulmonary disease, unspecified: Secondary | ICD-10-CM | POA: Diagnosis not present

## 2018-03-10 DIAGNOSIS — K819 Cholecystitis, unspecified: Secondary | ICD-10-CM | POA: Diagnosis not present

## 2018-03-10 DIAGNOSIS — J9611 Chronic respiratory failure with hypoxia: Secondary | ICD-10-CM | POA: Diagnosis not present

## 2018-03-10 DIAGNOSIS — Z434 Encounter for attention to other artificial openings of digestive tract: Secondary | ICD-10-CM | POA: Diagnosis not present

## 2018-03-10 DIAGNOSIS — I5030 Unspecified diastolic (congestive) heart failure: Secondary | ICD-10-CM | POA: Diagnosis not present

## 2018-03-11 ENCOUNTER — Other Ambulatory Visit: Payer: Self-pay | Admitting: Internal Medicine

## 2018-03-11 DIAGNOSIS — Z1231 Encounter for screening mammogram for malignant neoplasm of breast: Secondary | ICD-10-CM

## 2018-03-11 MED ORDER — EMPTY CONTAINER
2 refills | 0 days
Start: 2018-03-11 — End: ?

## 2018-03-12 ENCOUNTER — Encounter: Payer: Self-pay | Admitting: Pulmonary Disease

## 2018-03-12 ENCOUNTER — Ambulatory Visit (INDEPENDENT_AMBULATORY_CARE_PROVIDER_SITE_OTHER): Payer: Medicare Other | Admitting: Pulmonary Disease

## 2018-03-12 DIAGNOSIS — D869 Sarcoidosis, unspecified: Secondary | ICD-10-CM | POA: Diagnosis not present

## 2018-03-12 DIAGNOSIS — R0981 Nasal congestion: Secondary | ICD-10-CM

## 2018-03-12 DIAGNOSIS — J449 Chronic obstructive pulmonary disease, unspecified: Secondary | ICD-10-CM

## 2018-03-12 DIAGNOSIS — J9611 Chronic respiratory failure with hypoxia: Secondary | ICD-10-CM | POA: Diagnosis not present

## 2018-03-12 MED ORDER — CEFDINIR 300 MG PO CAPS: 300.0000 mg | ORAL_CAPSULE | Freq: Two times a day (BID) | ORAL | 0 refills | Status: DC

## 2018-03-12 MED FILL — HUMIRA PEN CITRATE FREE 40 MG/0.4 ML: 28 days supply | Qty: 4 | Fill #0 | Status: AC

## 2018-03-12 MED FILL — EMPTY CONTAINER: 30 days supply | Qty: 1 | Fill #0 | Status: AC

## 2018-03-12 MED FILL — EMPTY CONTAINER: 30 days supply | Qty: 1 | Fill #0

## 2018-03-12 NOTE — Assessment & Plan Note (Addendum)
Continue Symbicort Okay to resume pulmonary rehab when her other medical issues have subsided

## 2018-03-12 NOTE — Assessment & Plan Note (Signed)
Okay to stay off oxygen at rest Continue to use it on exertion We will reassess next visit

## 2018-03-12 NOTE — Assessment & Plan Note (Signed)
Schedule PFTs she had a drop in lung function from 20 16-20 18 but seems to have improved now. She is on Humira for skin lesions per rheumatologist

## 2018-03-12 NOTE — Progress Notes (Signed)
Subjective:    Patient ID: [name], female    DOB: [birth year], 58 y.o.   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  03/2016 Started onhumiradue to recurring cutaneous lesions, not responding to methotrexate ,She is now on Humira every week and prednisone  tapered off  Admitted 07/21/2016 for RLE DVT-had stoppedXarelto 04/2016 for prior pulmonary embolism  12/2017 ab x 2, pred x 2  Perc drain to GB 08/2017 finally underwent lap chole  01/2018 -she has recovered well, denies abdominal pain, has developed gluteal cyst  Breathing is doing better, she is able to go for short periods of oxygen, saturation 99% on 2 L oxygen today & 96% RA She had some problems getting Humira but her rheumatologist is helping her through this, skin lesions are recurring. She reports cough with green congestion she also reports head congestion and sputum and blood-tinged, she showed me a picture  Significant tests/ events   CT chest 12/2017 >> stable  Since 07/2016  PFT 05/2014 >> FVC 65%, ratio 69, FEV1 58%, DLCO 62% PFTs 12/2016 ratio 70 FEV1 49%, FVC 54% , TLC 74%, DLCO 41%  VQ Scan 08/17/15 >>LLL &RUL acute PE   ECHO 08/18/15 >>mild LVH , EF 55-60%, Gr 1 DD .   LE Venous Doppler 08/18/15 >>neg DVT , no bakers cyst, ?prominent inguinal lymph node measuring 3.1cm   MRI brain 04/2016  neg MRI spine = mild to moderate disc protrusion/left-sided spinal stenosis-chronic pain  NPSG 12/2004 mild, RDI 13/h   Past Medical History:  Diagnosis Date  . Allergic rhinitis   . ALLERGIC RHINITIS 10/26/2009  . Anxiety   . ANXIETY 08/14/2006  . B12 DEFICIENCY 08/25/2007  . Complication of anesthesia    pt has had difficulty following anesthesia with her knee in 2016-unable to care for herself afterward  . Confusion   . Depression    takes Lexapro daily  . Diabetes mellitus without complication ([place])    was on insulin but has been off since Nov 2015 and now only takes Metformin daily  . DYSPNEA 04/28/2009   with exertion  . Esophageal reflux    takes Nexium daily  . Fibromyalgia   . Headache    last migraine 2-79yrs ago;takes Topamax daily  . History of shingles   . Hypertension    takes Coreg daily  . Insomnia    takes Nortriptyline nightly   . Joint pain   . Joint swelling   . Left knee pain   . Lichen planus   . Long-term memory loss   . Nocturia   . OSA (obstructive sleep apnea)    doesn't use CPAP;sleep study in epic from 2006  . Osteoarthritis   . Osteoarthritis   . Pneumonia    over 30 yrs ago  . Protein calorie malnutrition ([place])   . Rheumatoid arthritis ([place])   . Sarcoidosis    Dr. Lolita Patella  . Short-term memory loss   . Shortness of breath   . TIA (transient ischemic attack)   . Unsteady gait   . Urinary urgency   . Vitamin D deficiency    is supposed to take Vit D but can't afford it  . VITAMIN D DEFICIENCY 08/25/2007     Review of Systems neg for any significant sore throat, dysphagia, itching, sneezing, nasal congestion or excess/ purulent secretions, fever, chills, sweats, unintended wt loss, pleuritic  or exertional cp, hempoptysis, orthopnea pnd or change in chronic leg swelling. Also denies presyncope, palpitations, heartburn, abdominal pain, nausea, vomiting, diarrhea or change in bowel or urinary habits, dysuria,hematuria, rash, arthralgias, visual complaints, headache, numbness weakness or ataxia.     Objective:   Physical Exam   Gen. Pleasant, obese, in no distress, normal affect ENT - no pallor,icterus, no post nasal drip, class 2 airway Neck: No JVD, no thyromegaly, no carotid bruits Lungs: no use of accessory muscles, no dullness to percussion, decreased without rales or rhonchi  Cardiovascular: Rhythm regular, heart sounds  normal, no murmurs or gallops, no peripheral  edema Abdomen: soft and non-tender, no hepatosplenomegaly, BS normal. No gluteal cyst palpated, tender , no erythema Musculoskeletal: No deformities, no cyanosis or clubbing Neuro:  alert, non focal, no tremors        Assessment & Plan:

## 2018-03-12 NOTE — Assessment & Plan Note (Signed)
Omnicef 300 twice daily for 14 days Treat for acute on chronic sinusitis

## 2018-03-12 NOTE — Patient Instructions (Signed)
Omnicef 300 twice daily for 14 days  Check oxygen saturation at rest Schedule PFTs in 1 month.  Let us know when you are ready to go back to pulmonary rehab

## 2018-03-17 ENCOUNTER — Telehealth: Payer: Self-pay | Admitting: Pulmonary Disease

## 2018-03-17 NOTE — Telephone Encounter (Signed)
Spoke with pt, she states she has tried to speak to her landlord about moving her apartment away from this neighbor that smokes marijuana. She states he smokes all day long while at home and she wants a letter to give to her landlord to advise her that this could affect her breathing. She thinks it would help make her understand that this is bad for her breathing. RA are you willing to write a letter? Please advise.

## 2018-03-17 NOTE — Telephone Encounter (Signed)
Called pt and advised message from the provider. Pt understood and verbalized understanding. Nothing further is needed.   Letter mailed to pt and she was made aware.

## 2018-03-17 NOTE — Telephone Encounter (Signed)
Okay to give a letter stating that she is under our care for respiratory issues and requires oxygen and cigarette smoke/any kind of smoke would affect her breathing. please help her in any way possible

## 2018-03-18 DIAGNOSIS — I130 Hypertensive heart and chronic kidney disease with heart failure and stage 1 through stage 4 chronic kidney disease, or unspecified chronic kidney disease: Secondary | ICD-10-CM | POA: Diagnosis not present

## 2018-03-18 DIAGNOSIS — Z434 Encounter for attention to other artificial openings of digestive tract: Secondary | ICD-10-CM | POA: Diagnosis not present

## 2018-03-18 DIAGNOSIS — I5030 Unspecified diastolic (congestive) heart failure: Secondary | ICD-10-CM | POA: Diagnosis not present

## 2018-03-18 DIAGNOSIS — K819 Cholecystitis, unspecified: Secondary | ICD-10-CM | POA: Diagnosis not present

## 2018-03-18 DIAGNOSIS — J449 Chronic obstructive pulmonary disease, unspecified: Secondary | ICD-10-CM | POA: Diagnosis not present

## 2018-03-18 DIAGNOSIS — J9611 Chronic respiratory failure with hypoxia: Secondary | ICD-10-CM | POA: Diagnosis not present

## 2018-03-19 ENCOUNTER — Telehealth: Payer: Self-pay | Admitting: Internal Medicine

## 2018-03-19 ENCOUNTER — Other Ambulatory Visit: Payer: Self-pay

## 2018-03-19 MED ORDER — BETAMETHASONE DIPROPIONATE AUG 0.05 % EX OINT: TOPICAL_OINTMENT | Freq: Two times a day (BID) | CUTANEOUS | 2 refills | Status: DC

## 2018-03-19 NOTE — Telephone Encounter (Signed)
Copied from [place] [id number]. Topic: Quick Communication - See Telephone Encounter >> Mar 19, 2018 12:09 PM Vernona Rieger wrote: CRM for notification. See Telephone encounter for: 03/19/18.  [facility] states esomeprazole (NEXIUM) 40 MG capsule is requiring a PA. They will be faxing something to the office as well, thanks.

## 2018-03-24 NOTE — Telephone Encounter (Signed)
PA denied.  Covered medications are: Dexilant 30mg , 60mg  (quantity limit of 30 capsules every 30 days) Lansoprazole (generic of Prevacid); (quantity limit of 30 capsules every 30 days)

## 2018-03-25 NOTE — Telephone Encounter (Signed)
Ok Dexilant 60 mg po qd Thx

## 2018-03-25 NOTE — Telephone Encounter (Signed)
LM notifying pt

## 2018-03-26 MED ORDER — DEXILANT 60 MG CAPSULE, DELAYED RELEASE
0 days
Start: 2018-03-26 — End: ?

## 2018-03-26 MED ORDER — DEXLANSOPRAZOLE 60 MG PO CPDR: 60.0000 mg | DELAYED_RELEASE_CAPSULE | Freq: Every day | ORAL | 3 refills | Status: DC

## 2018-03-26 NOTE — Telephone Encounter (Signed)
rx sent

## 2018-03-26 NOTE — Telephone Encounter (Signed)
Pt calling back to check status. No other medication called in. Please advise

## 2018-04-02 NOTE — Unmapped (Signed)
Carolyn Newton reports doing well on her Humira. She does have trouble paying for her test strips - I advised we could potentially transfer in vs switch her to our house brand ON CALL. She will call us back next week after she speaks with her son.     [facility] Clinical Assessment & Refill Coordination Note    Carolyn Newton, DOB: November 20, 1960  Phone: [phone] (home)     All above HIPAA information was verified with patient.     Specialty Medication(s):   Inflammatory Disorders: Humira     Current Outpatient Medications   Medication Sig Dispense Refill   ??? ADALIMUMAB PEN CITRATE FREE 40 MG/0.4 ML Inject the contents of 1 pen (40 mg) under the skin weekly 4 each 11   ??? albuterol (PROVENTIL HFA;VENTOLIN HFA) 90 mcg/actuation inhaler Inhale 2 puffs.     ??? azithromycin (ZITHROMAX) 250 MG tablet Zpack taper as directed  0   ??? blood sugar diagnostic (FREESTYLE LITE STRIPS) Strp TEST TWICE DAILY     ??? blood-glucose meter (FREESTYLE LITE METER) kit Use to check blood sugar 2 times per day     ??? carvedilol (COREG) 12.5 MG tablet TAKE 1 TABLET BY MOUTH TWICE DAILY WITH A MEAL     ??? chlorhexidine (PERIDEX) 0.12 % solution 5 mL.     ??? clobetasol (TEMOVATE) 0.05 % ointment Apply topically Two (2) times a day. 60 g 5   ??? clonazePAM (KLONOPIN) 0.5 MG tablet      ??? diclofenac sodium (VOLTAREN) 1 % gel Apply 2 g topically 2 (two) times daily as needed (for pain). 100/4=25     ??? docusate sodium (COLACE) 100 MG capsule Take 100 mg by mouth.     ??? empty container Misc USE AS DIRECTED 1 each 2   ??? escitalopram oxalate (LEXAPRO) 20 MG tablet TAKE 1 TABLET BY MOUTH DAILY     ??? esomeprazole (NEXIUM) 40 MG capsule Take 40 mg by mouth.     ??? fluticasone propionate (FLONASE) 50 mcg/actuation nasal spray 1 spray into each nostril.     ??? furosemide (LASIX) 20 MG tablet Take 20 mg by mouth.     ??? gabapentin (NEURONTIN) 300 MG capsule Take 1 capsule (300 mg total) by mouth 2 (two) times daily as needed (for pain).  1   ??? hydrOXYzine (ATARAX) 25 MG tablet Take 25 mg by mouth.     ??? lancets (FREESTYLE) 28 gauge Misc 1 each.     ??? levoFLOXacin (LEVAQUIN) 750 MG tablet Take 750 mg by mouth.     ??? LIDOCAINE 2 % solution      ??? metFORMIN (GLUCOPHAGE) 500 MG tablet TAKE 2 TABLETS(1000 MG) BY MOUTH DAILY WITH FOOD     ??? methylPREDNISolone (MEDROL DOSEPACK) 4 mg tablet As directed po     ??? mupirocin (BACTROBAN) 2 % ointment Apply topically.     ??? nortriptyline (PAMELOR) 75 MG capsule Take 75 mg by mouth.     ??? nystatin (MYCOSTATIN) 100,000 unit/mL suspension      ??? omeprazole (PRILOSEC) 20 MG capsule Take 20 mg by mouth.     ??? potassium chloride (KLOR-CON 10) 10 MEQ CR tablet TAKE 1 TABLET BY MOUTH DAILY     ??? predniSONE (DELTASONE) 10 MG tablet Take 1 tablet daily with food until next visit.     ??? rivaroxaban (XARELTO) 20 mg tablet TAKE 1 TABLET BY MOUTH EVERY DAY WITH SUPPER     ???  suvorexant (BELSOMRA) 20 mg tablet Take 20 mg by mouth.     ??? SYMBICORT 160-4.5 mcg/actuation inhaler Inhale 2 puffs into the lungs TWICE DAILY 120/4=30  5   ??? topiramate (TOPAMAX) 50 MG tablet TAKE 1 TABLET BY MOUTH TWICE DAILY     ??? venlafaxine (EFFEXOR-XR) 75 MG 24 hr capsule Take 75 mg by mouth.       Current Facility-Administered Medications   Medication Dose Route Frequency Provider Last Rate Last Dose   ??? triamcinolone acetonide (KENALOG) injection 10 mg  10 mg Intradermal Once Elsie Stain, MD            Changes to medications: Aftin reports no changes reported at this time.    Allergies   Allergen Reactions   ??? Other Other (See Comments)     Patient has Sarcoidosis and can't tolerate any metals   ??? Suvorexant Other (See Comments)     Larey Seat out of bed while asleep: I'm waking up as I'm falling on the floor; Night terrors   ??? Enalapril Maleate      REACTION: cough   ??? Hydrochlorothiazide      REACTION: Low potassium   ??? Hydroxychloroquine Sulfate      REACTION: vision changes   ??? Iodine      Mother was intolerant--CODED on CT table---pt never tired.   ??? Latex      Hives   ??? Nickel Other (See Comments)     Pt has a titanium right and left knee - nickel causes skin irritations that forms into blisters and sores  Other reaction(s): Other (See Comments)  Pt has a titanium right knee - nickel causes skin irritations that forms into blisters and sores   ??? Adhesive Tape-Silicones Other (See Comments)     Medical tape causes bruising   ??? Doxycycline Diarrhea and Nausea And Vomiting   ??? Pregabalin Other (See Comments)     Made me feel high       Changes to allergies: No    SPECIALTY MEDICATION ADHERENCE     Humira - 1 left on hand       Specialty medication(s) dose(s) confirmed: Regimen is correct and unchanged.     Are there any concerns with adherence? No    Adherence counseling provided? Not needed    CLINICAL MANAGEMENT AND INTERVENTION      Clinical Benefit Assessment:    Do you feel the medicine is effective or helping your condition? Yes    Clinical Benefit counseling provided? Not needed    Adverse Effects Assessment:    Are you experiencing any side effects? No    Are you experiencing difficulty administering your medicine? No    Quality of Life Assessment:    How many days over the past month did your cutaneous sarcoid keep you from your normal activities? For example, brushing your teeth or getting up in the morning. 0    Have you discussed this with your provider? Not needed    Therapy Appropriateness:    Is therapy appropriate? Yes, therapy is appropriate and should be continued    DISEASE/MEDICATION-SPECIFIC INFORMATION      For patients on injectable medications: Patient currently has 1 doses left.  Next injection is scheduled for Friday, March 20.    PATIENT SPECIFIC NEEDS     ? Does the patient have any physical, cognitive, or cultural barriers? No    ? Is the patient high risk? No     ? Does the  patient require a Care Management Plan? No     ? Does the patient require physician intervention or other additional services (i.e. nutrition, smoking cessation, social work)? No      SHIPPING     Specialty Medication(s) to be Shipped:   Inflammatory Disorders: Humira    Other medication(s) to be shipped: na     Changes to insurance: No    Delivery Scheduled: Yes, Expected medication delivery date: Thurs, March 26.     Medication will be delivered via UPS to the confirmed home address in [facility].    The patient will receive a drug information handout for each medication shipped and additional FDA Medication Guides as required.  Verified that patient has previously received a Conservation officer, historic buildings.    Lanney Gins   [facility] Specialty Pharmacist

## 2018-04-03 ENCOUNTER — Other Ambulatory Visit: Payer: Self-pay | Admitting: *Deleted

## 2018-04-03 NOTE — Patient Outreach (Signed)
[facility]) Care Management  04/03/2018  [place] 11-14-1960 [id number]   Call placed to member to follow up on current medical status.  She state she is doing "wonderful.  This is the best I've felt in a long time."  Denies any pain or discomfort, denies any trouble sleeping.  State she has increased her social interaction, which has helped with her depression.  This care manager assessed the need for further community needs, she denies.  Discussed ongoing disease management through health coach, she agrees that would be a good idea.    Will close to community nursing at this time. Will place referral to health coach and notify MD of transition.  Valente David, [place], MSN [place] [phone]

## 2018-04-05 DIAGNOSIS — L439 Lichen planus, unspecified: Principal | ICD-10-CM

## 2018-04-06 ENCOUNTER — Telehealth: Payer: Self-pay | Admitting: Internal Medicine

## 2018-04-06 DIAGNOSIS — L439 Lichen planus, unspecified: Principal | ICD-10-CM

## 2018-04-06 MED ORDER — BLOOD-GLUCOSE METER
0 refills | 0 days
Start: 2018-04-06 — End: ?

## 2018-04-06 MED ORDER — HALOBETASOL PROPIONATE 0.05 % TOPICAL OINTMENT
Freq: Two times a day (BID) | TOPICAL | 5 refills | 0 days | Status: CP
Start: 2018-04-06 — End: ?
  Filled 2018-04-08: qty 50, 30d supply, fill #0

## 2018-04-06 MED ORDER — BLOOD SUGAR DIAGNOSTIC STRIPS
12 refills | 0 days
Start: 2018-04-06 — End: ?

## 2018-04-06 MED ORDER — ONETOUCH VERIO W/DEVICE KIT: PACK | 0 refills | Status: DC

## 2018-04-06 MED ORDER — GLUCOSE BLOOD VI STRP: ORAL_STRIP | 12 refills | Status: DC

## 2018-04-06 NOTE — Telephone Encounter (Signed)
The only Medicare covered DM testing supplies are OneTouch.  I have sent a new meter and strips to pharmacy listed below.

## 2018-04-06 NOTE — Telephone Encounter (Signed)
[facility] [place] [facility] would like to see if Dr Cruzita Lederer could send in a prescription for a ON CALL Meter Lancets, strips and lancet Deceive   They stated the meters etc she was using before was not covered by insurance  Please advise  [place], [place]    Phone- [phone] Darlyne Russian.

## 2018-04-07 ENCOUNTER — Other Ambulatory Visit: Payer: Self-pay

## 2018-04-07 ENCOUNTER — Encounter: Payer: Self-pay | Admitting: Internal Medicine

## 2018-04-07 ENCOUNTER — Other Ambulatory Visit (INDEPENDENT_AMBULATORY_CARE_PROVIDER_SITE_OTHER): Payer: Medicare Other

## 2018-04-07 ENCOUNTER — Ambulatory Visit (INDEPENDENT_AMBULATORY_CARE_PROVIDER_SITE_OTHER): Payer: Medicare Other | Admitting: Internal Medicine

## 2018-04-07 DIAGNOSIS — F411 Generalized anxiety disorder: Secondary | ICD-10-CM

## 2018-04-07 DIAGNOSIS — I10 Essential (primary) hypertension: Secondary | ICD-10-CM

## 2018-04-07 DIAGNOSIS — I825Y1 Chronic embolism and thrombosis of unspecified deep veins of right proximal lower extremity: Secondary | ICD-10-CM | POA: Diagnosis not present

## 2018-04-07 DIAGNOSIS — E1122 Type 2 diabetes mellitus with diabetic chronic kidney disease: Secondary | ICD-10-CM

## 2018-04-07 DIAGNOSIS — E538 Deficiency of other specified B group vitamins: Secondary | ICD-10-CM

## 2018-04-07 DIAGNOSIS — J9611 Chronic respiratory failure with hypoxia: Secondary | ICD-10-CM | POA: Diagnosis not present

## 2018-04-07 DIAGNOSIS — N182 Chronic kidney disease, stage 2 (mild): Secondary | ICD-10-CM

## 2018-04-07 DIAGNOSIS — J449 Chronic obstructive pulmonary disease, unspecified: Secondary | ICD-10-CM

## 2018-04-07 DIAGNOSIS — I2782 Chronic pulmonary embolism: Secondary | ICD-10-CM | POA: Diagnosis not present

## 2018-04-07 LAB — BASIC METABOLIC PANEL
BUN: 17 mg/dL (ref 6–23)
CO2: 27 mEq/L (ref 19–32)
Calcium: 9.3 mg/dL (ref 8.4–10.5)
Chloride: 107 mEq/L (ref 96–112)
Creatinine, Ser: 0.91 mg/dL (ref 0.40–1.20)
GFR: 63.56 mL/min (ref >60.00)
GLUCOSE: 132 mg/dL — AB (ref 70–99)
Potassium: 4 mEq/L (ref 3.5–5.1)
Sodium: 141 mEq/L (ref 135–145)

## 2018-04-07 LAB — CBC WITH DIFFERENTIAL/PLATELET
Basophils Absolute: 0.1 10*3/uL (ref 0.0–0.1)
Basophils Relative: 0.9 % (ref 0.0–3.0)
EOS PCT: 4.7 % (ref 0.0–5.0)
Eosinophils Absolute: 0.3 10*3/uL (ref 0.0–0.7)
HCT: 35.3 % — ABNORMAL LOW (ref 36.0–46.0)
Hemoglobin: 11.5 g/dL — ABNORMAL LOW (ref 12.0–15.0)
Lymphocytes Relative: 39.3 % (ref 12.0–46.0)
Lymphs Abs: 2.4 10*3/uL (ref 0.7–4.0)
MCHC: 32.4 g/dL (ref 30.0–36.0)
MCV: 86.3 fl (ref 78.0–100.0)
Monocytes Absolute: 0.6 10*3/uL (ref 0.1–1.0)
Monocytes Relative: 10.4 % (ref 3.0–12.0)
Neutro Abs: 2.8 10*3/uL (ref 1.4–7.7)
Neutrophils Relative %: 44.7 % (ref 43.0–77.0)
Platelets: 234 10*3/uL (ref 150.0–400.0)
RBC: 4.1 Mil/uL (ref 3.87–5.11)
RDW: 15.6 % — ABNORMAL HIGH (ref 11.5–15.5)
WBC: 6.2 10*3/uL (ref 4.0–10.5)

## 2018-04-07 LAB — HEMOGLOBIN A1C: Hgb A1c MFr Bld: 6.4 % (ref 4.6–6.5)

## 2018-04-07 MED ORDER — CYANOCOBALAMIN 1000 MCG/ML IJ SOLN
1000.0000 ug | Freq: Once | INTRAMUSCULAR | Status: AC
  Administered 2018-04-07: 1000 ug via INTRAMUSCULAR

## 2018-04-07 NOTE — Assessment & Plan Note (Signed)
Xarelto

## 2018-04-07 NOTE — Progress Notes (Signed)
Subjective:  Patient ID: [name], female    DOB: [birth year]  Age: 58 y.o. MRN: [id number]  CC: No chief complaint on file.   HPI [name] presents for chronic cough, sarcoidosis, anxiety C/o fatigue  Outpatient Medications Prior to Visit  Medication Sig Dispense Refill  . Adalimumab (HUMIRA PEN-PS/UV/ADOL HS START) 40 MG/0.8ML PNKT Inject 40 mg into the skin once a week.    [name] augmented betamethasone dipropionate (DIPROLENE-AF) 0.05 % ointment Apply topically 2 (two) times daily. 45 g 2  . B-D ULTRA-FINE 33 LANCETS MISC Use as instructed to test sugars once  daily 100 each 12  . Blood Glucose Monitoring Suppl (ONETOUCH VERIO) w/Device KIT Use to check blood sugar once a day 1 kit 0  . budesonide-formoterol (SYMBICORT) 160-4.5 MCG/ACT inhaler Inhale 2 puffs into the lungs TWICE DAILY 120/4=30 10.2 g 0  . carvedilol (COREG) 12.5 MG tablet Take 1 tablet (12.5 mg total) by mouth 2 (two) times daily with a meal. 180 tablet 3  . clonazePAM (KLONOPIN) 0.5 MG tablet Take 1-2 tablets (0.5-1 mg total) by mouth at bedtime as needed for anxiety. 60 tablet 3  . dexlansoprazole (DEXILANT) 60 MG capsule Take 1 capsule (60 mg total) by mouth daily. 90 capsule 3  . fluticasone (FLONASE) 50 MCG/ACT nasal spray Place 1 spray into both nostrils daily. (Patient taking differently: Place 1 spray into both nostrils daily as needed for allergies. ) 16 g 2  . furosemide (LASIX) 40 MG tablet TAKE 1 TABLET BY MOUTH EVERY MORNING FOR EDEMA (Patient taking differently: Take 40 mg by mouth daily as needed for fluid or edema. ) 90 tablet 1  . gabapentin (NEURONTIN) 300 MG capsule Take 1 capsule (300 mg total) by mouth 2 (two) times daily as needed (for pain). (Patient taking differently: Take 300 mg by mouth 2 (two) times daily. ) 180 capsule 1  . glucose blood (ONETOUCH VERIO) test strip Use as instructed to check blood sugar once a day. 100 each 12  . hydrOXYzine (ATARAX/VISTARIL) 25 MG  tablet Take 1 tablet (25 mg total) by mouth every 8 (eight) hours as needed for itching. (Patient taking differently: Take 25 mg by mouth 2 (two) times daily. ) 60 tablet 1  . metFORMIN (GLUCOPHAGE-XR) 500 MG 24 hr tablet TAKE 2 TABLETS(1000 MG) BY MOUTH DAILY WITH BREAKFAST 180 tablet 2  . potassium chloride (K-DUR,KLOR-CON) 10 MEQ tablet Take 1 tablet by mouth daily.    [name] topiramate (TOPAMAX) 50 MG tablet Take 1 tablet (50 mg total) by mouth 2 (two) times daily. 180 tablet 1  . venlafaxine XR (EFFEXOR-XR) 75 MG 24 hr capsule Take 1 capsule (75 mg total) by mouth daily with breakfast. 30 capsule 11  . VENTOLIN HFA 108 (90 Base) MCG/ACT inhaler Inhale 2 puffs into the lungs every 6 (six) hours as needed for wheezing or shortness of breath. 200/8=25 (Patient taking differently: Inhale 2 puffs into the lungs every 6 (six) hours as needed for wheezing or shortness of breath. ) 18 g 11  . VOLTAREN 1 % GEL Apply 2 g topically 2 (two) times daily as needed (for pain). 100/4=25 (Patient taking differently: Apply 4 g topically 4 (four) times daily as needed (pain). ) 100 g 2  . XARELTO 20 MG TABS tablet Take 1 tablet (20 mg total) by mouth at bedtime. 90 tablet 3  . cefdinir (OMNICEF) 300 MG capsule Take 1 capsule (300 mg total) by mouth 2 (two) times daily. (Patient not  taking: Reported on 04/07/2018) 28 capsule 0   No facility-administered medications prior to visit.     ROS: Review of Systems  Constitutional: Positive for fatigue. Negative for activity change, appetite change, chills and unexpected weight change.  HENT: Negative for congestion, mouth sores and sinus pressure.   Eyes: Negative for visual disturbance.  Respiratory: Positive for cough and shortness of breath. Negative for chest tightness.   Gastrointestinal: Negative for abdominal pain and nausea.  Genitourinary: Negative for difficulty urinating, frequency and vaginal pain.  Musculoskeletal: Positive for back pain. Negative for gait  problem.  Skin: Negative for pallor and rash.  Neurological: Negative for dizziness, tremors, weakness, numbness and headaches.  Psychiatric/Behavioral: Negative for confusion, decreased concentration, sleep disturbance and suicidal ideas. The patient is nervous/anxious.     Objective:  BP 122/74 (BP Location: Right Arm, Patient Position: Sitting, Cuff Size: Large)   Pulse 66   Temp 97.8 F (36.6 C) (Oral)   Ht _0  (1.702 m)   Wt 268 lb (121.6 kg)   LMP 05/19/2003   SpO2 97%   BMI 41.97 kg/m   BP Readings from Last 3 Encounters:  04/07/18 122/74  03/12/18 118/70  03/03/18 120/82    Wt Readings from Last 3 Encounters:  04/07/18 268 lb (121.6 kg)  03/12/18 264 lb (119.7 kg)  03/03/18 264 lb (119.7 kg)    Physical Exam Constitutional:      General: She is not in acute distress.    Appearance: She is well-developed.  HENT:     Head: Normocephalic.     Right Ear: External ear normal.     Left Ear: External ear normal.     Nose: Nose normal.  Eyes:     General:        Right eye: No discharge.        Left eye: No discharge.     Conjunctiva/sclera: Conjunctivae normal.     Pupils: Pupils are equal, round, and reactive to light.  Neck:     Musculoskeletal: Normal range of motion and neck supple.     Thyroid: No thyromegaly.     Vascular: No JVD.     Trachea: No tracheal deviation.  Cardiovascular:     Rate and Rhythm: Normal rate and regular rhythm.     Heart sounds: Normal heart sounds.  Pulmonary:     Effort: No respiratory distress.     Breath sounds: No stridor. No wheezing.  Abdominal:     General: Bowel sounds are normal. There is no distension.     Palpations: Abdomen is soft. There is no mass.     Tenderness: There is no abdominal tenderness. There is no guarding or rebound.  Musculoskeletal:        General: No tenderness.  Lymphadenopathy:     Cervical: No cervical adenopathy.  Skin:    Findings: No erythema or rash.  Neurological:     Cranial  Nerves: No cranial nerve deficit.     Motor: No abnormal muscle tone.     Coordination: Coordination normal.     Deep Tendon Reflexes: Reflexes normal.  Psychiatric:        Behavior: Behavior normal.        Thought Content: Thought content normal.        Judgment: Judgment normal.   scars healed Obese Walker Lichen rash   Lab Results  Component Value Date   WBC 10.0 02/12/2018   HGB 9.8 (L) 02/12/2018   HCT 31.6 (L) 02/12/2018  PLT 225 02/12/2018   GLUCOSE 117 (H) 02/12/2018   CHOL 237 (H) 06/30/2017   TRIG 271.0 (H) 06/30/2017   HDL 70.00 06/30/2017   LDLDIRECT 107.0 06/30/2017   LDLCALC 97 06/29/2008   ALT 85 (H) 02/12/2018   AST 117 (H) 02/12/2018   NA 141 02/12/2018   K 3.8 02/12/2018   CL 110 02/12/2018   CREATININE 0.96 02/12/2018   BUN 15 02/12/2018   CO2 25 02/12/2018   TSH 2.31 08/23/2016   INR 1.00 02/11/2018   HGBA1C 5.9 (H) 02/05/2018   MICROALBUR <0.7 02/01/2015    No results found.  Assessment & Plan:   There are no diagnoses linked to this encounter.   No orders of the defined types were placed in this encounter.    Follow-up: No follow-ups on file.  Walker Kehr, MD

## 2018-04-07 NOTE — Assessment & Plan Note (Signed)
On O2 

## 2018-04-07 NOTE — Assessment & Plan Note (Signed)
Coreg Lasix 

## 2018-04-07 NOTE — Assessment & Plan Note (Signed)
B12 sq today

## 2018-04-07 NOTE — Assessment & Plan Note (Signed)
Lexapro, Pamelor

## 2018-04-07 NOTE — Assessment & Plan Note (Signed)
Metformin, Januvia 

## 2018-04-08 ENCOUNTER — Ambulatory Visit: Payer: Medicare Other

## 2018-04-08 MED FILL — HUMIRA PEN CITRATE FREE 40 MG/0.4 ML: 28 days supply | Qty: 4 | Fill #1 | Status: AC

## 2018-04-08 MED FILL — HUMIRA PEN CITRATE FREE 40 MG/0.4 ML: 28 days supply | Qty: 4 | Fill #1

## 2018-04-08 MED FILL — HALOBETASOL PROPIONATE 0.05 % TOPICAL OINTMENT: 30 days supply | Qty: 50 | Fill #0 | Status: AC

## 2018-04-09 ENCOUNTER — Telehealth: Payer: Self-pay | Admitting: Pulmonary Disease

## 2018-04-09 NOTE — Telephone Encounter (Signed)
Primary Pulmonologist: Dr. Elsworth Soho Last office visit and with whom: 03/12/2018 with Dr. Elsworth Soho What do we see them for (pulmonary problems): Sarcoidosis  Reason for call: Reports severe sinus congestion. Denies coughing, chest tightness, wheezing, shortness of breath, fever, recent travel or sick contacts that she is aware of. When she does blow her nose, she is getting out dark green mucus.   In the last month, have you been in contact with someone who was confirmed or suspected to have Conoravirus / COVID-19?  No Have you traveled internationally or to an area with more than 100 reported cases of Coronavirus / COVID-19?  No  Do you have any of the following symptoms developed in the last 30 days? Fever: No Cough: No Shortness of breath: No  When did your symptoms start?  Today  If the patient has a fever, what is the last reading?  (use n/a if patient denies fever)  N/A . IF THE PATIENT STATES THEY DO NOT OWN A THERMOMETER, THEY MUST GO AND PURCHASE ONE When did the fever start?: N/A Have you taken any medication to suppress a fever (ie Ibuprofen, Aleve, Tylenol)?: N/A

## 2018-04-09 NOTE — Telephone Encounter (Signed)
Please make televisit for tomorrow for [name] NP   Tell pt she can use saline nasal rinses  flonase  2 puffs daily  claritin As needed  Daily  mucinex Twice daily  As needed  Congestion    Please contact office for sooner follow up if symptoms do not improve or worsen or seek emergency care

## 2018-04-09 NOTE — Telephone Encounter (Signed)
Patient states that she having  nasal congestion that started yesterday and would like something sent in. No Fever at this time. Has been to the Doctor this week.

## 2018-04-09 NOTE — Telephone Encounter (Signed)
Called and spoke with pt stating to her that TP wants to schedule her a televisit tomorrow with [place]. Pt expressed understanding. televisit has been scheduled for pt tomorrow at 11:30. Also stated other recommendations to pt per TP and pt verbalized understanding. Nothing further needed.

## 2018-04-10 ENCOUNTER — Encounter: Payer: Self-pay | Admitting: Nurse Practitioner

## 2018-04-10 ENCOUNTER — Ambulatory Visit (INDEPENDENT_AMBULATORY_CARE_PROVIDER_SITE_OTHER): Payer: Medicare Other | Admitting: Nurse Practitioner

## 2018-04-10 ENCOUNTER — Other Ambulatory Visit: Payer: Self-pay

## 2018-04-10 DIAGNOSIS — J0100 Acute maxillary sinusitis, unspecified: Secondary | ICD-10-CM | POA: Diagnosis not present

## 2018-04-10 MED ORDER — AMOXICILLIN-POT CLAVULANATE 875-125 MG PO TABS: 1.0000 | ORAL_TABLET | Freq: Two times a day (BID) | ORAL | 0 refills | Status: DC

## 2018-04-10 NOTE — Progress Notes (Signed)
Virtual Visit via Telephone Note  I connected with [name] on 04/10/18 at 11:30 AM EDT by telephone and verified that I am speaking with the correct person using two identifiers.   I discussed the limitations, risks, security and privacy concerns of performing an evaluation and management service by telephone and the availability of in person appointments. I also discussed with the patient that there may be a patient responsible charge related to this service. The patient expressed understanding and agreed to proceed.   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 Patient is followed by Dr. Elsworth Soho.   Patient has a tele-visit today for acute sinus congestion.  She was seen by Dr. Elsworth Soho on 03/12/2018 for sinus congestion and was prescribed Omnicef.  She states that she did get better after taking this but her symptoms have returned this past week.  She states that the issue is mainly severe sinus congestion with green nasal drainage.  She denies any cough.  She denies any recent fever.  She has been taking Mucinex with minimal relief noted.  She states that she cannot tolerate her Flonase. Denies f/c/s, n/v/d, hemoptysis, PND, leg swelling.    Observations/Objective: PFT 05/2014 >> FVC 65%, ratio 69, FEV1 58%, DLCO 62%  VQ Scan 08/17/15 >>LLL &RUL acute PE   ECHO 08/18/15 >>mild LVH , EF 55-60%, Gr 1 DD .   LE Venous Doppler 08/18/15 >>neg DVT , no bakers cyst, ?prominent inguinal lymph node measuring 3.1cm   MRI brain neg Spine Center = MRI spine = mild to moderate disc protrusion/left-sided spinal stenosis-chronic pain  NPSG 12/2004 mild, RDI 13/h  12/26/2017-CT chest with contrast- no acute cardiopulmonary abnormalities, chronic changes of sarcoid appears stable Candida 12/18/2017-respiratory sputum culture showing  yeast  12/27/2017-CT chest with contrast- no acute cardiopulmonary abnormalities, chronic changes of sarcoid appears stable, right upper quadrantpercutaneous cutaneous cholecystectomy tube in position  Assessment and Plan: Patient has a tele-visit today for acute sinus congestion.  She was seen by Dr. Elsworth Soho on 03/12/2018 for sinus congestion and was prescribed Omnicef.  She states that she did get better after taking this but her symptoms have returned this past week.  She states that the issue is mainly severe sinus congestion with green nasal drainage.  She denies any cough.  She denies any recent fever.  She has been taking Mucinex with minimal relief noted. Will treat for sinusitis.  Patient Instructions  Will order Augmentin Continue Mucinex twice daily Continue symbicort May take zyrtec daily    Follow Up Instructions: Follow up with Dr. Elsworth Soho as scheduled Please call if symptoms worsen    I discussed the assessment and treatment plan with the patient. The patient was provided an opportunity to ask questions and all were answered. The patient agreed with the plan and demonstrated an understanding of the instructions.   The patient was advised to call back or seek an in-person evaluation if the symptoms worsen or if the condition fails to improve as anticipated.  I provided 22 minutes of non-face-to-face time during this encounter.   Fenton Foy, NP

## 2018-04-10 NOTE — Assessment & Plan Note (Signed)
Patient has a tele-visit today for acute sinus congestion.  She was seen by Dr. Elsworth Soho on 03/12/2018 for sinus congestion and was prescribed Omnicef.  She states that she did get better after taking this but her symptoms have returned this past week.  She states that the issue is mainly severe sinus congestion with green nasal drainage.  She denies any cough.  She denies any recent fever.  She has been taking Mucinex with minimal relief noted. Will treat for sinusitis.  Patient Instructions  Will order Augmentin Continue Mucinex twice daily Continue symbicort May take zyrtec daily  Follow up with Dr. Elsworth Soho as scheduled Please call if symptoms worsen

## 2018-04-10 NOTE — Patient Instructions (Addendum)
Will order Augmentin Continue Mucinex twice daily Continue symbicort May take zyrtec daily  Follow up with Dr. Elsworth Soho as scheduled Please call if symptoms worsen

## 2018-04-20 ENCOUNTER — Telehealth: Payer: Self-pay | Admitting: Internal Medicine

## 2018-04-20 MED ORDER — HYDROXYZINE HCL 25 MG PO TABS: 25.0000 mg | ORAL_TABLET | Freq: Three times a day (TID) | ORAL | 1 refills | Status: DC | PRN

## 2018-04-20 NOTE — Telephone Encounter (Signed)
erx has been sent.  

## 2018-04-20 NOTE — Telephone Encounter (Signed)
Left voicemail message requesting a refill of Hydroxyzine 25 mg to go to [facility]. Returned call to pt to verify correct medicine was requested.

## 2018-04-28 ENCOUNTER — Telehealth: Payer: Self-pay | Admitting: Internal Medicine

## 2018-04-28 MED FILL — ON CALL EXPRESS TEST STRIP: 100 days supply | Qty: 100 | Fill #0 | Status: AC

## 2018-04-28 MED FILL — ON CALL EXPRESS TEST STRIP: 100 days supply | Qty: 100 | Fill #0

## 2018-04-28 MED FILL — ON CALL EXPRESS METER: 30 days supply | Qty: 1 | Fill #0

## 2018-04-28 MED FILL — ON CALL EXPRESS METER: 30 days supply | Qty: 1 | Fill #0 | Status: AC

## 2018-04-28 NOTE — Unmapped (Signed)
[facility] Refill Coordination Note    Specialty Medication(s) to be Shipped:   Inflammatory Disorders: Humira    Other medication(s) to be shipped:       Carolyn Newton, DOB: 1960/01/27  Phone: [phone] (home)       All above HIPAA information was verified with patient.     Completed refill call assessment today to schedule patient's medication shipment from the [facility] [phone]).       Specialty medication(s) and dose(s) confirmed: Regimen is correct and unchanged.   Changes to medications: [name] reports no changes at this time.  Changes to insurance: No  Questions for the pharmacist: No    Confirmed patient received Welcome Packet with first shipment. The patient will receive a drug information handout for each medication shipped and additional FDA Medication Guides as required.       DISEASE/MEDICATION-SPECIFIC INFORMATION        N/A    SPECIALTY MEDICATION ADHERENCE     Medication Adherence    Patient reported X missed doses in the last month:  0  Specialty Medication:  humira 40mg /0.60ml                Humira 40/0.4 mg/ml: 14 days         SHIPPING     Shipping address confirmed in Epic.     Delivery Scheduled: Yes, Expected medication delivery date: 042820.     Medication will be delivered via UPS to the home address in Epic WAM.    Carolyn Newton   [facility] Specialty Technician

## 2018-04-28 NOTE — Telephone Encounter (Signed)
MEDICATION: B-D ULTRA-FINE 33 LANCETS MISC  PHARMACY: [facility]  Fax # [phone] IS THIS A 90 DAY SUPPLY :   IS PATIENT OUT OF MEDICATION:   IF NOT; HOW MUCH IS LEFT:   LAST APPOINTMENT DATE: @3 /23/2020  NEXT APPOINTMENT DATE:@8 /21/2020  DO WE HAVE YOUR PERMISSION TO LEAVE A DETAILED MESSAGE:  OTHER COMMENTS:    **Let patient know to contact pharmacy at the end of the day to make sure medication is ready. **  ** Please notify patient to allow 48-72 hours to process**  **Encourage patient to contact the pharmacy for refills or they can request refills through [facility]**

## 2018-04-29 ENCOUNTER — Encounter: Payer: Self-pay | Admitting: *Deleted

## 2018-04-29 ENCOUNTER — Other Ambulatory Visit: Payer: Self-pay | Admitting: *Deleted

## 2018-04-29 ENCOUNTER — Other Ambulatory Visit: Payer: Self-pay

## 2018-04-29 MED ORDER — LANCETS 30 GAUGE
12 refills | 0 days
Start: 2018-04-29 — End: ?

## 2018-04-29 MED ORDER — BD LANCET ULTRAFINE 33G MISC: 12 refills | Status: DC

## 2018-04-29 MED FILL — ON CALL LANCET 30 GAUGE: 100 days supply | Qty: 100 | Fill #0

## 2018-04-29 MED FILL — ON CALL LANCET 30 GAUGE: 100 days supply | Qty: 100 | Fill #0 | Status: AC

## 2018-04-29 NOTE — Patient Outreach (Signed)
[facility]) Care Management  04/29/2018  [name] Apr 21, 1960 [id number]   CSW was able to make initial contact with patient today to perform phone assessment, as well as assess and assist with social work needs and services.  CSW introduced self, explained role and types of services provided through [facility] ([facility]).  CSW further explained to patient that CSW works with patient's Primary Care Physician, Dr. Lew Dawes, also with [facility]. CSW then explained the reason for the call, indicating that Dr. Alain Marion thought that patient would benefit from social work services and resources to assist with counseling and supportive services for symptoms of depression.  CSW obtained two HIPAA compliant identifiers from patient, which included patient's name and date of birth.  As soon as patient learned of the reason for CSW's call, patient became tearful, admitting to frequent crying spells over the past several weeks.  Patient then stated, "I don't think my medications are working but I can't communicate with Dr. Alain Marion because I do not know how to work this new phone I have been given".  Patient went on to say that her old phone broke, a family member dropped off a new phone at patient's front door, but was unable to teach patient how to make outgoing calls due to Coronavirus restrictions.  CSW was able to walk through the steps with patient over the phone, and agreed to contact Dr. Alain Marion to report findings of CSW's conversation with patient today.  CSW was able to confirm that patient now knows how to make outgoing calls, by having patient contact CSW directly.  CSW inquired about the various things that are causing patient symptoms of depression, for which patient reported, "mainly my neighbor.  Patient then stated, "The lady smokes a lot of cigarettes and marijuana, it comes through my vents and then I  cough and I am unable to breath".  Patient indicated that she has already reported the neighbor to her landlord, but to the best of her knowledge, nothing has been done about it yet.  CSW agreed to contact patient's landlord, on patient's behalf, but patient declined, indicating that she is "trying to keep the peace", not wanting to get kicked out of [address].  Patient reported that she plans to call the landlord again, especially because it is effecting her health and well-being.  Patient is on continuous oxygen, and has had to resort to staying in her bedroom to prevent having to deal with the smell and smoke.    Patient reported that she misses her son and 54-year-old grandson, also contributing to her depressive symptoms.  Patient has not been able to visit with them due to the Coronavirus and does not know when restrictions will be lifted.  Patient indicated that she tries to talk to them every day, "but it is just not the same".  Patient admitted that she is having trouble sleeping and that she has definitely put on weight because she is eating to try and make herself feel better.  Patient denies feeling homicidal or suicidal and assured CSW that she continues to take her psychotropic medications exactly as prescribed.  CSW was able to offer Cognitive Behavioral Therapy to patient throughout the session today, agreeing to continue to follow patient for counseling and supportive services, at least until CSW is able to get patient established with a therapist in the community.   Patient was receptive to having CSW mail her a list  of counselors/therapists in [facility] that accept Medicaid.  CSW agreed to place this list in the mail to patient today, then follow-up with patient next week, on Thursday, May 07, 2018, around 11:00AM to ensure that she received the information.  CSW also agreed to assist with the referral process.  Patient did not feel that it was necessary for CSW to mail her EMMI  information pertaining specifically to "Signs and Symptoms of Depression", admitting that she is "already well aware of the symptoms".  CSW was able to confirm that patient has the correct contact information for CSW, encouraging patient to contact CSW directly if social work services are needed in the meantime.  Patient did not wish for CSW to make a referral to nursing at this time, indicating that she is managing her health conditions well.  [name], BSW, MSW, LCSW  Licensed Education officer, environmental Health System  Mailing [place] [address], [place], [place] [address] Physical Address-[address], [place], [place] [address] Toll Free Main # [phone] Fax # [phone] Cell # [phone]  Office # [phone] [email or web address]

## 2018-04-29 NOTE — Telephone Encounter (Signed)
RX sent

## 2018-04-29 NOTE — Patient Outreach (Addendum)
[facility]) Care Management  04/29/2018   [name] [facility] 09-18-1960 [id number]      Outreach attempt # 1 to the patient for initial assessment.  Patient able to provide HIPAA.  She was able to complete the assessment.  Social:  The patient states that she lives in the home alone.  She states that she is independent with her ADLS/IADLS.[name]  She states that she has a son that is support ive of her.  She has transportation to her appointments.  She states that she is having some facial pain that she rates at about a 4/10.  She feels that she is getting another sinus infection.  Advised the patient to call her physician and notify the office.  The durable medical equipment in the home consist of a One touch meter, walker, cane,and oxygen,  Conditions: The patients conditions include:  HTN, Migraines, OSA, COPD, GERD,DM Type II, Osteoarthritis, Sarcoidosis, Anxiety, and Depression.  The patient states that she was recently diagnosed with Sarcoidosis.  She states that she was not told that she has COPD. Advised the patient to speak with her physician.  She verbalized understanding. The patient states that she is on oxygen 2 liters.  She uses her oxygen continuously.  She states that she just got over a sinus infection but feels that she is getting another one.  She has a neighbor that smokes and the smell comes to her apartment.  She states that she has talked with her neighbor and [facility], therapeutic but it has not helped.  She is planning on talking with the apartment manager again. She denied having any shortness of breath while sitting, no cough  but has some phlegm that she feels is coming from the beginning of another sinus infection.  She states that she does have some depression because she is unable to go out and do the things she use to,but she states that this year is the year of change for her.  She wants to spend time with her grandchildren and see some of her family  members she has not seen in a while.  She states that if she has to leave her home that she always wears a mask and uses the correct hygiene.  Medications: The patient takes sixteen medications and states that she is able to manage her medication.  She did not express any concern for paying for her medications.  Appointments:  The patient has an appointment with her Dr. Alain Marion on 7/1, PFT on 7/13 and Dr. Cruzita Lederer 8/21.  Advanced Directives:  The patient states that she has a DNR that she needs to have notarized.  She would like to have information on advanced directives sent to her.   Current Medications:  Current Outpatient Medications  Medication Sig Dispense Refill  . Adalimumab (HUMIRA PEN-PS/UV/ADOL HS START) 40 MG/0.8ML PNKT Inject 40 mg into the skin once a week.    [name] augmented betamethasone dipropionate (DIPROLENE-AF) 0.05 % ointment Apply topically 2 (two) times daily. 45 g 2  . B-D ULTRA-FINE 33 LANCETS MISC Use as instructed to test sugars once  daily 100 each 12  . Blood Glucose Monitoring Suppl (ONETOUCH VERIO) w/Device KIT Use to check blood sugar once a day 1 kit 0  . budesonide-formoterol (SYMBICORT) 160-4.5 MCG/ACT inhaler Inhale 2 puffs into the lungs TWICE DAILY 120/4=30 10.2 g 0  . carvedilol (COREG) 12.5 MG tablet Take 1 tablet (12.5 mg total) by mouth 2 (two) times daily with a meal.  180 tablet 3  . clonazePAM (KLONOPIN) 0.5 MG tablet Take 1-2 tablets (0.5-1 mg total) by mouth at bedtime as needed for anxiety. 60 tablet 3  . dexlansoprazole (DEXILANT) 60 MG capsule Take 1 capsule (60 mg total) by mouth daily. 90 capsule 3  . furosemide (LASIX) 40 MG tablet TAKE 1 TABLET BY MOUTH EVERY MORNING FOR EDEMA (Patient taking differently: Take 40 mg by mouth daily as needed for fluid or edema. ) 90 tablet 1  . gabapentin (NEURONTIN) 300 MG capsule Take 1 capsule (300 mg total) by mouth 2 (two) times daily as needed (for pain). (Patient taking differently: Take 300 mg by mouth 2  (two) times daily. ) 180 capsule 1  . glucose blood (ONETOUCH VERIO) test strip Use as instructed to check blood sugar once a day. 100 each 12  . hydrOXYzine (ATARAX/VISTARIL) 25 MG tablet Take 1 tablet (25 mg total) by mouth every 8 (eight) hours as needed for itching. 60 tablet 1  . metFORMIN (GLUCOPHAGE-XR) 500 MG 24 hr tablet TAKE 2 TABLETS(1000 MG) BY MOUTH DAILY WITH BREAKFAST 180 tablet 2  . potassium chloride (K-DUR,KLOR-CON) 10 MEQ tablet Take 1 tablet by mouth daily.    [name] topiramate (TOPAMAX) 50 MG tablet Take 1 tablet (50 mg total) by mouth 2 (two) times daily. 180 tablet 1  . venlafaxine XR (EFFEXOR-XR) 75 MG 24 hr capsule Take 1 capsule (75 mg total) by mouth daily with breakfast. 30 capsule 11  . VENTOLIN HFA 108 (90 Base) MCG/ACT inhaler Inhale 2 puffs into the lungs every 6 (six) hours as needed for wheezing or shortness of breath. 200/8=25 (Patient taking differently: Inhale 2 puffs into the lungs every 6 (six) hours as needed for wheezing or shortness of breath. ) 18 g 11  . VOLTAREN 1 % GEL Apply 2 g topically 2 (two) times daily as needed (for pain). 100/4=25 (Patient taking differently: Apply 4 g topically 4 (four) times daily as needed (pain). ) 100 g 2  . XARELTO 20 MG TABS tablet Take 1 tablet (20 mg total) by mouth at bedtime. 90 tablet 3  . amoxicillin-clavulanate (AUGMENTIN) 875-125 MG tablet Take 1 tablet by mouth 2 (two) times daily. (Patient not taking: Reported on 04/29/2018) 14 tablet 0  . fluticasone (FLONASE) 50 MCG/ACT nasal spray Place 1 spray into both nostrils daily. (Patient not taking: Reported on 04/29/2018) 16 g 2   No current facility-administered medications for this visit.     Functional Status:  In your present state of health, do you have any difficulty performing the following activities: 04/29/2018 02/12/2018  Hearing? N N  Vision? N N  Difficulty concentrating or making decisions? N N  Walking or climbing stairs? N Y  Dressing or bathing? N N   Doing errands, shopping? N -  Preparing Food and eating ? - -  Using the Toilet? - -  In the past six months, have you accidently leaked urine? - -  Do you have problems with loss of bowel control? - -  Managing your Medications? - -  Managing your Finances? - -  Housekeeping or managing your Housekeeping? - -  Some recent data might be hidden    Fall/Depression Screening: Fall Risk  04/29/2018 11/07/2017 10/06/2017  Falls in the past year? 0 No No  Number falls in past yr: - - -  Injury with Fall? - - -  Risk Factor Category  - - -  Risk for fall due to : - Impaired balance/gait;Impaired mobility -  Follow up - - -   PHQ 2/9 Scores 04/29/2018 11/07/2017 10/06/2017 10/02/2017 02/17/2017 01/15/2017 12/19/2016  PHQ - 2 Score 4 3 0 0 2 1 5   PHQ- 9 Score 19 8 - - 7 - 9    Assessment: Patient will benefit from health coach outreach for disease management and support.  [facility]N CM Care Plan Problem One     Most Recent Value  Care Plan Problem One  knowledge deficit related to COPD  Role Documenting the Problem One  Health Coach  Care Plan for Problem One  Active  THN Long Term Goal   In 30 days the patient will not have and readmissons to the hospital  [place] [place] Term Goal Start Date  04/29/18  Interventions for Problem One Long Term Goal   Discussed medication adherence, encourage the patient to call her physician to talk about changing her dpression medication, and talked about covid 19 precautionary measure      Plan: RN Health Coach will provide ongoing education for patient on COPD through phone calls and sending printed information to patient for further discussion.  RN Health Coach will send printed information on COPD.  RN Health Coach will send initial barriers letter, assessment, and care plan to primary care physician. RN Health Coach will contact patient in the month of May and patient agrees to next outreach.   Lazaro Arms RN, BSN, [facility] Direct Dial:  [phone]  Fax: [phone]

## 2018-05-05 ENCOUNTER — Telehealth: Payer: Self-pay | Admitting: Pulmonary Disease

## 2018-05-05 ENCOUNTER — Ambulatory Visit: Payer: Medicare Other | Admitting: Nurse Practitioner

## 2018-05-05 NOTE — Telephone Encounter (Signed)
Spoke with pt, she states she is stuffy and thinks it is her sinuses. She is blowing out green mucus and states the amoxicillin she was given did not work for her. I scheduled a televisit with Tonya tomorrow at 9:00am  Nothing further is needed.     Patient Instructions by Fenton Foy, NP at 04/10/2018 11:30 AM  Author: Fenton Foy, NP Author Type: Nurse Practitioner Filed: 04/10/2018 11:00 AM  Note Status: Addendum Cosign: Cosign Not Required Encounter Date: 04/10/2018  Editor: Fenton Foy, NP (Nurse Practitioner)  Prior Versions: 1. Fenton Foy, NP (Nurse Practitioner) at 04/10/2018 10:59 AM - Addendum   2. Fenton Foy, NP (Nurse Practitioner) at 04/10/2018 10:59 AM - Addendum   3. Fenton Foy, NP (Nurse Practitioner) at 04/10/2018 10:57 AM - Signed    Will order Augmentin Continue Mucinex twice daily Continue symbicort May take zyrtec daily  Follow up with Dr. Elsworth Soho as scheduled Please call if symptoms worsen    Instructions      Return if symptoms worsen or fail to improve.  Will order Augmentin Continue Mucinex twice daily Continue symbicort May take zyrtec daily

## 2018-05-06 ENCOUNTER — Telehealth: Payer: Self-pay | Admitting: Nurse Practitioner

## 2018-05-06 ENCOUNTER — Ambulatory Visit (INDEPENDENT_AMBULATORY_CARE_PROVIDER_SITE_OTHER): Payer: Medicare Other | Admitting: Nurse Practitioner

## 2018-05-06 ENCOUNTER — Encounter: Payer: Self-pay | Admitting: Nurse Practitioner

## 2018-05-06 ENCOUNTER — Other Ambulatory Visit: Payer: Self-pay

## 2018-05-06 DIAGNOSIS — J0100 Acute maxillary sinusitis, unspecified: Secondary | ICD-10-CM

## 2018-05-06 MED ORDER — PREDNISONE 10 MG TABLET
ORAL_TABLET | 0 refills | 0 days
Start: 2018-05-06 — End: ?

## 2018-05-06 MED ORDER — MOXIFLOXACIN 400 MG TABLET
ORAL_TABLET | 0 refills | 0 days
Start: 2018-05-06 — End: ?

## 2018-05-06 MED ORDER — LEVOFLOXACIN 500 MG PO TABS: 500.0000 mg | ORAL_TABLET | Freq: Every day | ORAL | 0 refills | Status: DC

## 2018-05-06 MED ORDER — MOXIFLOXACIN HCL 400 MG PO TABS: 400.0000 mg | ORAL_TABLET | Freq: Every day | ORAL | 0 refills | Status: DC

## 2018-05-06 MED ORDER — PREDNISONE 10 MG PO TABS: 20.0000 mg | ORAL_TABLET | Freq: Every day | ORAL | 0 refills | Status: AC

## 2018-05-06 NOTE — Telephone Encounter (Signed)
Yes. It should be 500 mg. Thanks.

## 2018-05-06 NOTE — Telephone Encounter (Signed)
Tonya, just to verify. Is it 50 mg or 500 mg for pt to take? Please advise. Thank you.

## 2018-05-06 NOTE — Patient Instructions (Addendum)
Will order Avelox May try Afrin for no more than 3 days Continue mucinex Continue symbicort May take zyrtec daily  General instructions: Hydrate  Drink enough water to keep your pee (urine) clear or pale yellow. Rest  Rest as much as possible.  Sleep with your head raised (elevated).  Make sure to get enough sleep each night. Other instructions  Put a warm, moist washcloth on your face 3-4 times a day or as told by your doctor. This will help with discomfort.  Wash your hands often with soap and water. If there is no soap and water, use hand sanitizer.  Do not smoke. Avoid being around people who are smoking (secondhand smoke). Call the office if:  You have a fever.  Your symptoms get worse.  Your symptoms do not get better within 10 days.   Follow up: Follow up with Dr. Elsworth Soho as scheduled

## 2018-05-06 NOTE — Telephone Encounter (Signed)
Rx for levofloxacin has been put in for pt. Called pt's pharmacy and spoke with pharmacist Madelynn Done letting him know that we did do this and they could disregard the Moxifloxacin due to it being on backorder. Madelynn Done expressed understanding and stated they had received the request and would get it taken care of for pt. Nothing further needed.

## 2018-05-06 NOTE — Telephone Encounter (Signed)
Received a call from pt's pharmacy stating that the moxifloxacin that was ordered for pt is currently on backorder. Request per pharmacy is to change pt to levofloxacin 50mg  for pt to take 1 daily x7 days. Tonya, please advise on this. Thanks!

## 2018-05-06 NOTE — Telephone Encounter (Signed)
Spoke with patient. She stated that the medication was sent to the wrong pharmacy. She uses the [facility] in [place] for her diabetic supplies. She uses [facility] here in [place] for the rest of her medication. She stated that [facility] does not have new RXs on file for her.   [facility], they do not have this RX on file. RX has been sent to the pharmacy.   Patient is aware that the RX has been sent. Nothing further needed at time of call.

## 2018-05-06 NOTE — Telephone Encounter (Signed)
Levaquin should be 500 mg. Thanks.

## 2018-05-06 NOTE — Telephone Encounter (Signed)
Pt is calling back. Per Pt, [name]. Pt also states that the ABX that was going to be called in is not covered by insurance. Cb is [phone]

## 2018-05-06 NOTE — Assessment & Plan Note (Signed)
Discussion: Patient complains today of severe sinus congestion with green nasal drainage.  She states that she did recover after taking Augmentin last month but symptoms have now returned.  She has been treated for sinusitis 3 times in the past 3 months.  If symptoms return she will need a referral to ENT for chronic sinusitis.   Patient Instructions  Will order Avelox May try Afrin for no more than 3 days Continue mucinex Continue symbicort May take zyrtec daily  General instructions: Hydrate  Drink enough water to keep your pee (urine) clear or pale yellow. Rest  Rest as much as possible.  Sleep with your head raised (elevated).  Make sure to get enough sleep each night. Other instructions  Put a warm, moist washcloth on your face 3-4 times a day or as told by your doctor. This will help with discomfort.  Wash your hands often with soap and water. If there is no soap and water, use hand sanitizer.  Do not smoke. Avoid being around people who are smoking (secondhand smoke). Call the office if:  You have a fever.  Your symptoms get worse.  Your symptoms do not get better within 10 days.   Follow up: Follow up with Dr. Elsworth Soho as scheduled

## 2018-05-06 NOTE — Progress Notes (Signed)
Virtual Visit via Telephone Note  I connected with [name] on 05/06/18 at  9:30 AM EDT by telephone and verified that I am speaking with the correct person using two identifiers.   I discussed the limitations, risks, security and privacy concerns of performing an evaluation and management service by telephone and the availability of in person appointments. I also discussed with the patient that there may be a patient responsible charge related to this service. The patient expressed understanding and agreed to proceed.   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 Patient is followed by Dr. Elsworth Soho.    Patient has a tele-visit today for sinus congestion pressure and pain.  She was last seen by me on 04/10/2018 and was treated for sinusitis with Augmentin.  She states that she did get better but over the last few days her symptoms have returned.  States that she does have severe sinus congestion with green nasal drainage.  She denies any cough.  She denies any recent fever. Denies f/c/s, n/v/d, hemoptysis, PND, leg swelling Denies chest pain or edema  Observations/Objective:  PFT 05/2014 >> FVC 65%, ratio 69, FEV1 58%, DLCO 62%  VQ Scan 08/17/15 >>LLL &RUL acute PE   ECHO 08/18/15 >>mild LVH , EF 55-60%, Gr 1 DD .   LE Venous Doppler 08/18/15 >>neg DVT , no bakers cyst, ?prominent inguinal lymph node measuring 3.1cm   MRI brain neg [facility] = MRI spine = mild to moderate disc protrusion/left-sided spinal stenosis-chronic pain  NPSG 12/2004 mild, RDI 13/h  12/26/2017-CT chest with contrast- no acute cardiopulmonary abnormalities, chronic changes of sarcoid appears stable Candida 12/18/2017-respiratory sputum culture showing yeast  12/27/2017-CT chest with contrast- no acute cardiopulmonary abnormalities, chronic  changes of sarcoid appears stable, right upper quadrantpercutaneous cutaneous cholecystectomy tube in position  Assessment and Plan:  Discussion: Patient complains today of severe sinus congestion with green nasal drainage.  She states that she did recover after taking Augmentin last month but symptoms have now returned.  She has been treated for sinusitis 3 times in the past 3 months.  If symptoms return she will need a referral to ENT for chronic sinusitis.   Patient Instructions  Will order Avelox May try Afrin for no more than 3 days Continue mucinex Continue symbicort May take zyrtec daily  General instructions: Hydrate  Drink enough water to keep your pee (urine) clear or pale yellow. Rest  Rest as much as possible.  Sleep with your head raised (elevated).  Make sure to get enough sleep each night. Other instructions  Put a warm, moist washcloth on your face 3-4 times a day or as told by your doctor. This will help with discomfort.  Wash your hands often with soap and water. If there is no soap and water, use hand sanitizer.  Do not smoke. Avoid being around people who are smoking (secondhand smoke). Call the office if:  You have a fever.  Your symptoms get worse.  Your symptoms do not get better within 10 days.   Follow Up Instructions:  Follow up with Dr. Elsworth Soho as scheduled    I discussed the assessment and treatment plan with the patient. The patient was provided an opportunity to ask questions and all were answered. The patient agreed with the plan and demonstrated an understanding of the instructions.   The patient was advised to call back or seek an  in-person evaluation if the symptoms worsen or if the condition fails to improve as anticipated.  I provided 22 minutes of non-face-to-face time during this encounter.   Fenton Foy, NP

## 2018-05-07 ENCOUNTER — Other Ambulatory Visit: Payer: Self-pay | Admitting: *Deleted

## 2018-05-07 NOTE — Patient Outreach (Signed)
[facility]) Care Management  05/07/2018  [name] [facility] [birth year] [id number]   CSW was able to have a brief conversation with patient today to follow-up regarding social work services and resources, as well as to ensure that patient received the packet of resource information mailed to her home by CSW.  Patient confirmed that she received the list of counselors/therapists in [facility] that accept Medicaid; however, patient admitted that she has not gone through the information, "not feeling up to doing much of anything lately".  Patient went on to say that she is still not feeling well, having suffered from a sinus infection for the past two weeks.  Patient reported that she finally contacted her pulmonologist office yesterday (Wednesday, May 06, 2018) to explain her symptoms.  Patient spoke with Lazaro Arms, Nurse Practitioner with [facility] to obtain an antibiotic, Levofloxacin 500MG  was prescribed.  Patient's son, [name] will be dropping off the prescription later today.      Patient admitted that she was not really in the mood to talk today, experiencing a great deal of sinus pain and pressure.  Patient indicated that she was still lying in bed, "just wanting to try and sleep through the pain".  CSW voiced understanding, agreeing to follow-up with patient again next week, on Wednesday, May 13, 2018, around 10:00AM.  Patient was encouraged to contact CSW directly if she needs assistance in the meantime.  [name], BSW, MSW, LCSW  Licensed Education officer, environmental Health System  Mailing [employer] [address] [place], [place] [address] Physical Address-[address], [place], [place] [address] Toll Free Main # [phone] Fax # [phone] Cell # [phone]  Office # [phone] [email or web address]

## 2018-05-08 ENCOUNTER — Other Ambulatory Visit: Payer: Self-pay

## 2018-05-08 MED ORDER — CARVEDILOL 12.5 MG PO TABS: 12.5000 mg | ORAL_TABLET | Freq: Two times a day (BID) | ORAL | 3 refills | Status: DC

## 2018-05-08 MED ORDER — HYDROXYZINE HCL 25 MG PO TABS: 25.0000 mg | ORAL_TABLET | Freq: Three times a day (TID) | ORAL | 1 refills | Status: DC | PRN

## 2018-05-08 MED ORDER — XARELTO 20 MG PO TABS: 20.0000 mg | ORAL_TABLET | Freq: Every day | ORAL | 3 refills | Status: DC

## 2018-05-08 MED ORDER — POTASSIUM CHLORIDE CRYS ER 10 MEQ PO TBCR: 10.0000 meq | EXTENDED_RELEASE_TABLET | Freq: Every day | ORAL | 3 refills | Status: DC

## 2018-05-11 MED FILL — HUMIRA PEN CITRATE FREE 40 MG/0.4 ML: 28 days supply | Qty: 4 | Fill #2

## 2018-05-11 MED FILL — HUMIRA PEN CITRATE FREE 40 MG/0.4 ML: 28 days supply | Qty: 4 | Fill #2 | Status: AC

## 2018-05-13 ENCOUNTER — Ambulatory Visit: Payer: Medicare Other

## 2018-05-13 ENCOUNTER — Other Ambulatory Visit: Payer: Self-pay | Admitting: *Deleted

## 2018-05-13 NOTE — Patient Outreach (Signed)
[place]) Care Management  05/13/2018  [name] 26-Apr-1960 [id number]   CSW made an attempt to try and contact patient today to follow-up regarding social work services and resources, as well as to check the status of patient's mental health and sinus infection.  Patient was not available at the time of CSW's call.  Therefore, CSW left a HIPAA compliant message for patient on voicemail and is currently awaiting a return call.  CSW will make a second outreach attempt within the next 3-4 business days, if a return call is not received from patient in the meantime.  [name], BSW, MSW, LCSW  Licensed Education officer, environmental Health System  Mailing [place] [address] [place], [place] [address] Physical Address[address], [place],  [address] Toll Free Main # [phone] Fax # [phone] Cell # [phone]  Office # [phone] [email or web address]

## 2018-05-19 ENCOUNTER — Encounter: Payer: Self-pay | Admitting: *Deleted

## 2018-05-19 ENCOUNTER — Other Ambulatory Visit: Payer: Self-pay | Admitting: *Deleted

## 2018-05-19 NOTE — Patient Outreach (Signed)
[facility]) Care Management  05/19/2018  [name] 1960/03/01 [id number]   CSW made a second attempt to try and contact patient today to follow-up regarding social work services and resources, as well as offer counseling and supportive services; however, patient was not available at the time of CSW's call.  A HIPAA compliant message was left for patient on voicemail.  CSW continues to await a return call.  CSW will make a third and final outreach attempt within the next 3-4 business days, if a return call is not received from patient in the meantime.  CSW will also mail an outreach letter to patient's house, requesting tha patient contact CSW at her earliest convenience if she is interested in continuing to receive social work services through [place] with [facility].  CSW will then proceed with case closure if a return call is not received from patient with a total of 10 business days, as required number of phone attempts will have been made and outreach letter mailed.   [name], BSW, MSW, LCSW  Licensed Education officer, environmental Health System  Mailing [place] [address] [place], [place] [address] Physical Address[address], [place], [place] [address] Toll Free Main # [phone] Fax # [phone] Cell # [phone]  Office # [phone] [email or web address]

## 2018-05-21 ENCOUNTER — Other Ambulatory Visit: Payer: Self-pay | Admitting: Internal Medicine

## 2018-05-22 ENCOUNTER — Other Ambulatory Visit: Payer: Self-pay | Admitting: *Deleted

## 2018-05-22 NOTE — Patient Outreach (Signed)
[facility]) Care Management  05/22/2018  MAURIE MUSCO [birth year] [id number]    CSW made a third and final attempt to try and contact patient today to follow-up regarding social work services and resources, without success.  A HIPAA compliant message was left for patient on voicemail.  CSW is currently awaiting a return call.  CSW will proceed with case closure in 4 business days (due to being on PAL), if a return call is not received from patient in the meantime, as required number of phone attempts have been made and an outreach letter was mailed to patient's home allowing 10 business days for a response.  [name], BSW, MSW, LCSW  Licensed Education officer, environmental Health System  Mailing [place] [address], [place], [place] [address] Physical Address[address], [place], [place] [address] Toll Free Main # [phone] Fax # [phone] Cell # [phone]  Office # [phone] [email or web address]

## 2018-05-27 ENCOUNTER — Other Ambulatory Visit: Payer: Self-pay | Admitting: Internal Medicine

## 2018-05-27 ENCOUNTER — Encounter: Payer: Self-pay | Admitting: *Deleted

## 2018-05-27 ENCOUNTER — Other Ambulatory Visit: Payer: Self-pay | Admitting: *Deleted

## 2018-05-27 NOTE — Patient Outreach (Signed)
[facility] [facility]) Care Management  05/27/2018  [name] 1960/03/25 [id number]   CSW will perform a case closure on patient, due to inability to maintain phone contact with patient, despite required number of phone attempts made and outreach letter mailed to patient's home, allowing 10 business days for a response if patient is interested in continuing to receive social work services from [name] with [facility].  CSW will notify patient's [place] with [facility] [facility], Lazaro Arms of CSW's plans to close patient's case.  CSW will fax an update to patient's Primary Care Physician, Dr. [name] Plotnikov to ensure that they are aware of CSW's involvement with patient's plan of care.    [name], BSW, MSW, LCSW  Licensed Education officer, environmental Health System  Mailing [place] [address] [place], [place] [address] Physical Address[address], [place], [place] [address] Toll Free Main # [phone] Fax # [phone] Cell # [phone]  Office # [phone] [email or web address]

## 2018-05-29 ENCOUNTER — Other Ambulatory Visit: Payer: Self-pay

## 2018-05-29 NOTE — Patient Outreach (Signed)
[facility] [facility]) Care Management  05/29/2018   [name] [facility] 1960-11-01 [id number]  Subjective: Successful outreach tot he patient. Two patient identifiers obtained.  She states that she is not feeling well.  It started with her sinuses and now she has a cold.  She states that she has seen her physician and taken an antibiotic but it didn't help.  She has bumped her oxygen up to 3 litters.  She states that it was approved by her pulmonologist.  She states that she is having problems with her let ear.  She can't hear out that ear and her balance is off.  She haas an appointment scheduled with ENT next Thursday.  Advise the patient to walk with her walker, take it slow, make purposeful steps and when standing wait for a moment to adjust to her surroundings.  She verbalized understanding.  She states that she follows preventative measures for covid-19.  Sh does not allow people in her home, she wears a mask, she washes her hands and cleans her home often.  Current Medications:  Current Outpatient Medications  Medication Sig Dispense Refill  . Adalimumab (HUMIRA PEN-PS/UV/ADOL HS START) 40 MG/0.8ML PNKT Inject 40 mg into the skin once a week.    [name] augmented betamethasone dipropionate (DIPROLENE-AF) 0.05 % ointment Apply topically 2 (two) times daily. 45 g 2  . B-D ULTRA-FINE 33 LANCETS MISC Use as instructed to test sugars once  daily 100 each 12  . Blood Glucose Monitoring Suppl (ONETOUCH VERIO) w/Device KIT Use to check blood sugar once a day 1 kit 0  . budesonide-formoterol (SYMBICORT) 160-4.5 MCG/ACT inhaler Inhale 2 puffs into the lungs TWICE DAILY 120/4=30 10.2 g 0  . carvedilol (COREG) 12.5 MG tablet Take 1 tablet (12.5 mg total) by mouth 2 (two) times daily with a meal. 180 tablet 3  . clonazePAM (KLONOPIN) 0.5 MG tablet Take 1-2 tablets (0.5-1 mg total) by mouth at bedtime as needed for anxiety. 60 tablet 3  . dexlansoprazole (DEXILANT) 60 MG capsule Take 1 capsule (60  mg total) by mouth daily. 90 capsule 3  . furosemide (LASIX) 40 MG tablet TAKE 1 TABLET BY MOUTH EVERY MORNING FOR EDEMA (Patient taking differently: Take 40 mg by mouth daily as needed for fluid or edema. ) 90 tablet 1  . gabapentin (NEURONTIN) 300 MG capsule Take 1 capsule (300 mg total) by mouth 2 (two) times daily as needed (for pain). (Patient taking differently: Take 300 mg by mouth 2 (two) times daily. ) 180 capsule 1  . glucose blood (ONETOUCH VERIO) test strip Use as instructed to check blood sugar once a day. 100 each 12  . hydrOXYzine (ATARAX/VISTARIL) 25 MG tablet Take 1 tablet (25 mg total) by mouth every 8 (eight) hours as needed for itching. 60 tablet 1  . potassium chloride (K-DUR) 10 MEQ tablet Take 1 tablet (10 mEq total) by mouth daily. 90 tablet 3  . topiramate (TOPAMAX) 50 MG tablet Take 1 tablet (50 mg total) by mouth 2 (two) times daily. 180 tablet 3  . venlafaxine XR (EFFEXOR-XR) 75 MG 24 hr capsule Take 1 capsule (75 mg total) by mouth daily with breakfast. 30 capsule 11  . VENTOLIN HFA 108 (90 Base) MCG/ACT inhaler Inhale 2 puffs into the lungs every 6 (six) hours as needed for wheezing or shortness of breath. 200/8=25 (Patient taking differently: Inhale 2 puffs into the lungs every 6 (six) hours as needed for wheezing or shortness of breath. ) 18 g 11  .  VOLTAREN 1 % GEL Apply 2 g topically 2 (two) times daily as needed (for pain). 100/4=25 (Patient taking differently: Apply 4 g topically 4 (four) times daily as needed (pain). ) 100 g 2  . XARELTO 20 MG TABS tablet Take 1 tablet (20 mg total) by mouth at bedtime. 90 tablet 3  . amoxicillin-clavulanate (AUGMENTIN) 875-125 MG tablet Take 1 tablet by mouth 2 (two) times daily. (Patient not taking: Reported on 04/29/2018) 14 tablet 0  . fluticasone (FLONASE) 50 MCG/ACT nasal spray Place 1 spray into both nostrils daily. (Patient not taking: Reported on 04/29/2018) 16 g 2  . levofloxacin (LEVAQUIN) 500 MG tablet Take 1 tablet (500 mg  total) by mouth daily. 7 tablet 0  . metFORMIN (GLUCOPHAGE-XR) 500 MG 24 hr tablet TAKE 2 TABLETS(1000 MG) BY MOUTH DAILY WITH BREAKFAST (Patient not taking: Reported on 05/29/2018) 180 tablet 2  . moxifloxacin (AVELOX) 400 MG tablet Take 1 tablet (400 mg total) by mouth daily. (Patient not taking: Reported on 05/29/2018) 7 tablet 0   No current facility-administered medications for this visit.     Functional Status:  In your present state of health, do you have any difficulty performing the following activities: 04/29/2018 04/29/2018  Hearing? N N  Vision? N N  Difficulty concentrating or making decisions? N N  Walking or climbing stairs? N N  Dressing or bathing? N N  Doing errands, shopping? N N  Preparing Food and eating ? N -  Using the Toilet? N -  In the past six months, have you accidently leaked urine? N -  Do you have problems with loss of bowel control? N -  Managing your Medications? N -  Managing your Finances? N -  Housekeeping or managing your Housekeeping? N -  Some recent data might be hidden    Fall/Depression Screening: Fall Risk  05/29/2018 04/29/2018 04/29/2018  Falls in the past year? 0 0 0  Number falls in past yr: - 0 -  Injury with Fall? - 0 -  Risk Factor Category  - - -  Risk for fall due to : - - -  Follow up - - -   PHQ 2/9 Scores 04/29/2018 04/29/2018 11/07/2017 10/06/2017 10/02/2017 02/17/2017 01/15/2017  PHQ - 2 Score 4 4 3  0 0 2 1  PHQ- 9 Score 19 19 8  - - 7 -    Assessment: Patient will continue to benefit from health coach outreach for disease management and support. THN CM Care Plan Problem One     Most Recent Value  THN Long Term Goal   In 60 days the patient will not have and readmissons to the hospital for COPd exacerbation  THN Long Term Goal Start Date  05/29/18  Interventions for Problem One Long Term Goal  Reviewed signs and symptoms of COPD, discussed the action plan and medication adherence, Advised patient to avoid people who are sick, social  distancing and washing hands with soap and water or using hand sanitizer.       Plan: RN Health Coach will contact patient in the month of July and patient agrees to next outreach.   Lazaro Arms RN, BSN, [place] Direct Dial:  [phone]  Fax: [phone]

## 2018-06-04 DIAGNOSIS — D869 Sarcoidosis, unspecified: Secondary | ICD-10-CM | POA: Diagnosis not present

## 2018-06-04 DIAGNOSIS — H6522 Chronic serous otitis media, left ear: Secondary | ICD-10-CM | POA: Insufficient documentation

## 2018-06-04 DIAGNOSIS — H6983 Other specified disorders of Eustachian tube, bilateral: Secondary | ICD-10-CM | POA: Diagnosis not present

## 2018-06-04 DIAGNOSIS — H903 Sensorineural hearing loss, bilateral: Secondary | ICD-10-CM | POA: Diagnosis not present

## 2018-06-04 DIAGNOSIS — H90A32 Mixed conductive and sensorineural hearing loss, unilateral, left ear with restricted hearing on the contralateral side: Secondary | ICD-10-CM | POA: Insufficient documentation

## 2018-06-12 ENCOUNTER — Ambulatory Visit: Payer: Medicare Other | Admitting: Adult Health

## 2018-06-12 DIAGNOSIS — H6522 Chronic serous otitis media, left ear: Secondary | ICD-10-CM | POA: Diagnosis not present

## 2018-06-12 DIAGNOSIS — J3489 Other specified disorders of nose and nasal sinuses: Secondary | ICD-10-CM | POA: Diagnosis not present

## 2018-06-12 DIAGNOSIS — J310 Chronic rhinitis: Secondary | ICD-10-CM | POA: Insufficient documentation

## 2018-06-15 ENCOUNTER — Other Ambulatory Visit: Payer: Self-pay | Admitting: *Deleted

## 2018-06-15 MED ORDER — GABAPENTIN 300 MG PO CAPS: 300.0000 mg | ORAL_CAPSULE | Freq: Two times a day (BID) | ORAL | 1 refills | Status: DC | PRN

## 2018-06-15 NOTE — Unmapped (Signed)
[facility] Refill Coordination Note    Specialty Medication(s) to be Shipped:   Inflammatory Disorders: Humira    Other medication(s) to be shipped: na     Carolyn Newton, DOB: 08-11-1960  Phone: [phone] (home)       All above HIPAA information was verified with patient.     Completed refill call assessment today to schedule patient's medication shipment from the [facility] [phone]).       Specialty medication(s) and dose(s) confirmed: Regimen is correct and unchanged.   Changes to medications: Carolyn Newton reports no changes at this time.  Changes to insurance: No  Questions for the pharmacist: No    Confirmed patient received Welcome Packet with first shipment. The patient will receive a drug information handout for each medication shipped and additional FDA Medication Guides as required.       DISEASE/MEDICATION-SPECIFIC INFORMATION        N/A    SPECIALTY MEDICATION ADHERENCE     Medication Adherence    Patient reported X missed doses in the last month:  1  Specialty Medication:  humira cf 40 mg/0.4 ml-pt missed a dose from being sick  Patient is on additional specialty medications:  No  Patient is on more than two specialty medications:  No  Any gaps in refill history greater than 2 weeks in the last 3 months:  no  Demonstrates understanding of importance of adherence:  yes  Informant:  patient  Reliability of informant:  reliable  Confirmed plan for next specialty medication refill:  delivery by pharmacy  Refills needed for supportive medications:  not needed              humira cf 40 mg/0.4 ml. 3 pens remaining for the next 3 weeks. Takes on Mondays      SHIPPING     Shipping address confirmed in Epic.     Delivery Scheduled: Yes, Expected medication delivery date: 060320.     Medication will be delivered via UPS to the home address in Epic [place].    Carolyn Newton D Carolyn Newton   [facility] Specialty Technician

## 2018-06-16 ENCOUNTER — Other Ambulatory Visit: Payer: Self-pay | Admitting: Internal Medicine

## 2018-06-16 MED FILL — HUMIRA PEN CITRATE FREE 40 MG/0.4 ML: 28 days supply | Qty: 4 | Fill #3

## 2018-06-16 MED FILL — HUMIRA PEN CITRATE FREE 40 MG/0.4 ML: 28 days supply | Qty: 4 | Fill #3 | Status: AC

## 2018-06-22 ENCOUNTER — Other Ambulatory Visit: Payer: Self-pay

## 2018-06-22 ENCOUNTER — Ambulatory Visit
Admission: RE | Admit: 2018-06-22 | Discharge: 2018-06-22 | Disposition: A | Discharge status: Home / Self Care | Payer: Medicare Other | Source: Ambulatory Visit | Attending: Internal Medicine | Admitting: Internal Medicine

## 2018-06-22 DIAGNOSIS — Z1231 Encounter for screening mammogram for malignant neoplasm of breast: Secondary | ICD-10-CM | POA: Diagnosis not present

## 2018-06-30 ENCOUNTER — Telehealth: Payer: Self-pay | Admitting: Pulmonary Disease

## 2018-06-30 DIAGNOSIS — D869 Sarcoidosis, unspecified: Secondary | ICD-10-CM

## 2018-06-30 DIAGNOSIS — J449 Chronic obstructive pulmonary disease, unspecified: Secondary | ICD-10-CM

## 2018-06-30 NOTE — Telephone Encounter (Signed)
Returned call to patient. She says she uses a walker and runs over tubing. She needs tubing for both POC and concentrator.  DME order placed  Nothing further needed.

## 2018-07-02 DIAGNOSIS — D485 Neoplasm of uncertain behavior of skin: Secondary | ICD-10-CM | POA: Diagnosis not present

## 2018-07-06 ENCOUNTER — Other Ambulatory Visit: Payer: Self-pay | Admitting: Internal Medicine

## 2018-07-15 ENCOUNTER — Encounter: Payer: Self-pay | Admitting: Internal Medicine

## 2018-07-15 ENCOUNTER — Ambulatory Visit (INDEPENDENT_AMBULATORY_CARE_PROVIDER_SITE_OTHER): Payer: Medicare Other | Admitting: Internal Medicine

## 2018-07-15 DIAGNOSIS — B373 Candidiasis of vulva and vagina: Secondary | ICD-10-CM | POA: Diagnosis not present

## 2018-07-15 DIAGNOSIS — R0981 Nasal congestion: Secondary | ICD-10-CM

## 2018-07-15 DIAGNOSIS — B3731 Acute candidiasis of vulva and vagina: Secondary | ICD-10-CM | POA: Insufficient documentation

## 2018-07-15 DIAGNOSIS — J0100 Acute maxillary sinusitis, unspecified: Secondary | ICD-10-CM | POA: Diagnosis not present

## 2018-07-15 MED ORDER — AMOXICILLIN-POT CLAVULANATE 875-125 MG PO TABS: 1.0000 | ORAL_TABLET | Freq: Two times a day (BID) | ORAL | 0 refills | Status: DC

## 2018-07-15 MED ORDER — FLUCONAZOLE 150 MG PO TABS: 150.0000 mg | ORAL_TABLET | Freq: Once | ORAL | 1 refills | Status: AC

## 2018-07-15 MED ORDER — TRIAMCINOLONE ACETONIDE 0.5 % EX CREA: 1.0000 "application " | TOPICAL_CREAM | Freq: Three times a day (TID) | CUTANEOUS | 0 refills | Status: DC

## 2018-07-15 NOTE — Unmapped (Signed)
[facility] Refill Coordination Note    Specialty Medication(s) to be Shipped:   Inflammatory Disorders: Humira 40mg /8ml     Carolyn Newton, DOB: [birth year]  Phone: [phone] (home)     All above HIPAA information was verified with patient.     Completed refill call assessment today to schedule patient's medication shipment from the [facility] [phone]).       Specialty medication(s) and dose(s) confirmed: Regimen is correct and unchanged.   Changes to medications: Jenelle reports no changes reported at this time.  Changes to insurance: No  Questions for the pharmacist: No    Confirmed patient received Welcome Packet with first shipment. The patient will receive a drug information handout for each medication shipped and additional FDA Medication Guides as required.       DISEASE/MEDICATION-SPECIFIC INFORMATION        For patients on injectable medications: Patient currently has 2 doses left.  Next injection is scheduled for 07/15/2018 & 07/22/2018.    SPECIALTY MEDICATION ADHERENCE     Medication Adherence    Patient reported X missed doses in the last month:  0  Specialty Medication:  humira cf 40 mg/0.4 ml   Patient is on additional specialty medications:  No  Patient is on more than two specialty medications:  No        SHIPPING     Shipping address confirmed in Epic.     Delivery Scheduled: Yes, Expected medication delivery date: 07/22/2018.     Medication will be delivered via UPS to the home address in Epic [place].    Karem Tomaso P Allena Katz   [facility] Shared [facility] Specialty Technician

## 2018-07-15 NOTE — Assessment & Plan Note (Signed)
Relapsing sinusitis.  Prescribed Augmentin for 10 days.  Diflucan if needed

## 2018-07-15 NOTE — Assessment & Plan Note (Signed)
Diflucan prescription 

## 2018-07-15 NOTE — Assessment & Plan Note (Signed)
Skin irritation over naris.  Prescribed triamcinolone cream to use 3 times a day

## 2018-07-15 NOTE — Progress Notes (Signed)
Virtual Visit via Telephone Note  I connected with [name] on 07/15/18 at 10:20 AM EDT by telephone and verified that I am speaking with the correct person using two identifiers.   I discussed the limitations, risks, security and privacy concerns of performing an evaluation and management service by telephone and the availability of in person appointments. I also discussed with the patient that there may be a patient responsible charge related to this service. The patient expressed understanding and agreed to proceed.   History of Present Illness: [name] is complaining of 3 nasal discharge 1-2 weeks duration.  It drains down her throat and is making her throat sore.  She has irritation on the nares due to drainage and the need to blow her nose.  No fever.  She saw her ENT doctor before May 29 and was told that a lot of her problems are related to sarcoidosis.  She tends to get yeast infections when treated with antibiotics.  She denies wheezing, fever, chest pain.  She continues to have problems with sarcoidosis.  No tachycardia, constipation, syncope   Observations/Objective:  [name] sounds normal on the phone Assessment and Plan:  See plan Follow Up Instructions:    I discussed the assessment and treatment plan with the patient. The patient was provided an opportunity to ask questions and all were answered. The patient agreed with the plan and demonstrated an understanding of the instructions.   The patient was advised to call back or seek an in-person evaluation if the symptoms worsen or if the condition fails to improve as anticipated.  I provided 21 minutes of non-face-to-face time during this encounter.   Walker Kehr, MD

## 2018-07-20 ENCOUNTER — Other Ambulatory Visit: Payer: Self-pay | Admitting: Pulmonary Disease

## 2018-07-20 ENCOUNTER — Encounter: Admit: 2018-07-20 | Discharge: 2018-07-21 | Payer: MEDICARE

## 2018-07-20 DIAGNOSIS — L438 Other lichen planus: Secondary | ICD-10-CM | POA: Diagnosis not present

## 2018-07-20 DIAGNOSIS — D863 Sarcoidosis of skin: Secondary | ICD-10-CM | POA: Diagnosis not present

## 2018-07-20 DIAGNOSIS — D860 Sarcoidosis of lung: Secondary | ICD-10-CM | POA: Diagnosis not present

## 2018-07-20 DIAGNOSIS — Z79899 Other long term (current) drug therapy: Secondary | ICD-10-CM | POA: Diagnosis not present

## 2018-07-20 DIAGNOSIS — L298 Other pruritus: Secondary | ICD-10-CM | POA: Diagnosis not present

## 2018-07-20 DIAGNOSIS — L439 Lichen planus, unspecified: Principal | ICD-10-CM

## 2018-07-20 DIAGNOSIS — D869 Sarcoidosis, unspecified: Secondary | ICD-10-CM

## 2018-07-20 MED ORDER — NALTREXONE 50 MG TABLET
ORAL_TABLET | 11 refills | 0 days | Status: CP
Start: 2018-07-20 — End: ?

## 2018-07-20 MED ORDER — CLOBETASOL 0.05 % TOPICAL CREAM
Freq: Two times a day (BID) | TOPICAL | 5 refills | 0 days | Status: CP
Start: 2018-07-20 — End: ?

## 2018-07-20 NOTE — Unmapped (Signed)
ASSESSMENT/PLAN:  Sarcoidosis, cutaneous and pulmonary   1. Dr. Vassie Loll (pulmonary) on board with plan, continue adalimumab 40mg  weekly (started eow 06/2016, increased to weekly 09/2016) given that she is almost done with antibiotics for pneumonia and feeling well.  2. 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  3. Consider future ophtho exam to screen for ocular sarcoid - she was referred but has not been able to make it due to other medical issues.  -quant gold negative 07/2017    Lichen planus -I think this is actually causing most of her symptoms right now since her sarcoid seems to be pretty well controlled.  We will change her over to clobetasol cream since she does not like the feel of the ointment.  She can apply this twice daily.  -After the patient was informed of risks, benefits and side effects of intralesional steroid injection, the patient elected to undergo injection. Informed verbal consent was obtained. Risk of atrophy  and dyspigmentation with injection was explained. Kenalog 10 mg/ml was injected locally into the sites located on the thighs, feet, hands in a clean fashion following alcohol prep.   Total volume in ml=3.  Number of sites treated: >7   Wound care was explained to the patient     Pruritus: She can continue hydroxyzine 25 mg p.o. nightly as needed.  Add naltrexone 12.5mg  (quartering 50mg  tablets) once daily    RTC: 12 weeks    SUBJECTIVE:  CC:  Carolyn Newton is a 58 y.o. female  who presents for follow-up of sarcoidosis  She started adalimumab (80mg  loading, 40mg  eow maintenance) in 06/2016, increased to 40mg  weekly 09/2016 overall been doing well on this regimen.  Had to stop it due to cholecystitis in late 2019 and restarted again in February 2020 with good overall response with her sarcoid.  She has continued to have significant itching from her lichen planus on her thighs wrists, hands and feet she does not like the feel the clobetasol ointment so does not very often and is not sure how much it helps.  She also complains of more diffuse itching throughout her entire body.  She only had a moderate response to hydroxyzine for this.      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  Many years on and off of prednisone with temporary improvement, but overall thinks she is worse over time.  Has had a large amount of weight gain in the last year since restarting prednisone in April 2016.  Had come off recently, but now on Medrol dose pack from her PCP.  Was on methotrexate given worsening cutaneous involvement of the scalp.  Dose was 10mg  taken 2 doses 12 hours apart in winter 2017-18.  Did that for 3 months, but stopped because she thought it wasn't helping.  Had been on hydroxychloroquine many years ago, but it was not very helpful.  Has been on doxycycline many times over the years without much improvement.     Followed by Dr. Vassie Loll for pulmonary sarcoidosis and we have discussed the treatment plan.    ROS: The balance of 10 systems is negative unless otherwise documented..      OBJECTIVE:     Gen: Well appearing, well nourished female in no acute distress. Alert and oriented x 3  Exam: Exam of the scalp, face, eyelids, lips, neck, chest, abdomen, back, bilateral upper and lower extremities, palms, soles, and nails was performed  and notable for:  1. Alopecic patches on the frontal and occipital scalp, similar thin plaques on the L upper arm, L thigh, concha bilaterally, and nasolabial folds.  Minimal erythema  2. Moderate diffuse thinning of hair on most of the scalp  3. Violaceous patches with gray-white accentuation of skin tension lines on the right lower leg, right foot, bilateral medial thighs, left lateral thigh, and left dorsal foot.  -sites not commented on demonstrate normal findings.

## 2018-07-20 NOTE — Unmapped (Signed)
Patient Education        Sarcoidosis: Care Instructions  Your Care Instructions     Sarcoidosis (say sar-koy-DOH-sus) is a rare disease that causes tiny lumps of cells throughout the body called granulomas. These lumps are too small to see or feel. They can form anywhere on the inside or outside of the body and can cause permanent scar tissue. They often form in the lungs, lymph nodes, liver, skin, or eyes. Sarcoidosis may affect how an organ works. For instance, if it is in the lungs, you may be short of breath.  For most people, sarcoidosis is a long-term disease that lasts several years or a lifetime. But some cases go away in a few months. Experts have no way of knowing how it will affect you. For some people, the disease may cause no symptoms at all. For others, symptoms may include fever, body aches, swollen lymph glands, shortness of breath, painful joints, and numbness. It may lead to lung or heart problems. Sometimes sarcoidosis can cause high calcium levels in the blood.  Sarcoidosis occurs most often in young and middle-aged adults. Although the cause is not known, the disease does not spread from person to person.  Different types of sarcoidosis have different treatments. Sarcoidosis may require long-term treatment (lasting months to years) with corticosteroids and other medicines, especially if it causes symptoms. You may also need regular tests.  Follow-up care is a key part of your treatment and safety. Be sure to make and go to all appointments, and call your doctor if you are having problems. It's also a good idea to know your test results and keep a list of the medicines you take.  How can you care for yourself at home?  ?? Take your medicines exactly as prescribed. Call your doctor if you think you are having a problem with your medicine.  ?? Do not smoke. Smoking can make sarcoidosis worse. If you need help quitting, talk to your doctor about stop-smoking programs and medicines. These can increase your chances of quitting for good.  ?? Avoid dust, smoke, and fumes. They can harm your lungs.  ?? Drink plenty of fluids, enough so that your urine is light yellow or clear like water. If you have kidney, heart, or liver disease and have to limit fluids, talk with your doctor before you increase the amount of fluids you drink.  ?? If your doctor recommends it, get more exercise. Walking is a good choice. Bit by bit, increase the amount you walk every day. Try for at least 30 minutes on most days of the week. You also may want to swim, bike, or do other activities.  When should you call for help?   [id number] anytime you think you may need emergency care. For example, call if:  ?? You have severe trouble breathing.  ?? You passed out (lost consciousness).  Call your doctor now or seek immediate medical care if:  ?? You have changes in your vision.  ?? You are very tired, get confused, or urinate a lot.  ?? Your symptoms do not get better, or they get worse.  Watch closely for changes in your health, and be sure to contact your doctor if you have any problems.  Where can you learn more?  Go to [facility] at https://myuncchart.org  [facility] [employer]. Enter H756 in the search box to learn more about Sarcoidosis: Care Instructions.  Current as of: March 09, 2018??????????????????????????????Content Version: 12.5  ?? 2006-2020  [employer].   Care instructions adapted under license by [facility]. If you have questions about a medical condition or this instruction, always ask your healthcare professional. [employer] disclaims any warranty or liability for your use of this information.

## 2018-07-21 ENCOUNTER — Other Ambulatory Visit: Payer: Self-pay

## 2018-07-21 ENCOUNTER — Ambulatory Visit (INDEPENDENT_AMBULATORY_CARE_PROVIDER_SITE_OTHER)
Admission: RE | Admit: 2018-07-21 | Discharge: 2018-07-21 | Disposition: A | Discharge status: Home / Self Care | Payer: Medicare Other | Source: Ambulatory Visit | Attending: Adult Health | Admitting: Adult Health

## 2018-07-21 DIAGNOSIS — D869 Sarcoidosis, unspecified: Secondary | ICD-10-CM | POA: Diagnosis not present

## 2018-07-21 DIAGNOSIS — R918 Other nonspecific abnormal finding of lung field: Secondary | ICD-10-CM | POA: Diagnosis not present

## 2018-07-21 MED FILL — HUMIRA PEN CITRATE FREE 40 MG/0.4 ML: 28 days supply | Qty: 4 | Fill #4

## 2018-07-21 MED FILL — HUMIRA PEN CITRATE FREE 40 MG/0.4 ML: 28 days supply | Qty: 4 | Fill #4 | Status: AC

## 2018-07-22 MED ORDER — FLUOCINONIDE 0.05 % TOPICAL CREAM
Freq: Two times a day (BID) | TOPICAL | 5 refills | 0.00000 days | Status: CP
Start: 2018-07-22 — End: ?

## 2018-07-23 ENCOUNTER — Other Ambulatory Visit (HOSPITAL_COMMUNITY)
Admission: RE | Admit: 2018-07-23 | Discharge: 2018-07-23 | Disposition: A | Discharge status: Home / Self Care | Payer: Medicare Other | Source: Ambulatory Visit | Attending: Pulmonary Disease | Admitting: Pulmonary Disease

## 2018-07-23 DIAGNOSIS — Z1159 Encounter for screening for other viral diseases: Secondary | ICD-10-CM | POA: Diagnosis not present

## 2018-07-23 DIAGNOSIS — Z01812 Encounter for preprocedural laboratory examination: Secondary | ICD-10-CM | POA: Insufficient documentation

## 2018-07-24 LAB — SARS CORONAVIRUS 2 (TAT 6-24 HRS): SARS Coronavirus 2: NEGATIVE

## 2018-07-27 ENCOUNTER — Ambulatory Visit (INDEPENDENT_AMBULATORY_CARE_PROVIDER_SITE_OTHER): Payer: Medicare Other | Admitting: Pulmonary Disease

## 2018-07-27 ENCOUNTER — Encounter: Payer: Self-pay | Admitting: Adult Health

## 2018-07-27 ENCOUNTER — Other Ambulatory Visit: Payer: Self-pay

## 2018-07-27 ENCOUNTER — Ambulatory Visit (INDEPENDENT_AMBULATORY_CARE_PROVIDER_SITE_OTHER): Payer: Medicare Other | Admitting: Adult Health

## 2018-07-27 DIAGNOSIS — D869 Sarcoidosis, unspecified: Secondary | ICD-10-CM | POA: Diagnosis not present

## 2018-07-27 DIAGNOSIS — J9611 Chronic respiratory failure with hypoxia: Secondary | ICD-10-CM | POA: Diagnosis not present

## 2018-07-27 DIAGNOSIS — I2782 Chronic pulmonary embolism: Secondary | ICD-10-CM | POA: Diagnosis not present

## 2018-07-27 LAB — PULMONARY FUNCTION TEST
DL/VA % pred: 84 %
DL/VA: 3.51 ml/min/mmHg/L
DLCO unc % pred: 61 %
DLCO unc: 13.89 ml/min/mmHg
FEF 25-75 Post: 1.96 L/sec
FEF 25-75 Pre: 1.8 L/sec
FEF2575-%Change-Post: 8 %
FEF2575-%Pred-Post: 75 %
FEF2575-%Pred-Pre: 69 %
FEV1-%Change-Post: 2 %
FEV1-%Pred-Post: 70 %
FEV1-%Pred-Pre: 68 %
FEV1-Post: 2.03 L
FEV1-Pre: 1.99 L
FEV1FVC-%Change-Post: 1 %
FEV1FVC-%Pred-Pre: 101 %
FEV6-%Change-Post: 0 %
FEV6-%Pred-Post: 69 %
FEV6-%Pred-Pre: 69 %
FEV6-Post: 2.51 L
FEV6-Pre: 2.51 L
FEV6FVC-%Pred-Post: 103 %
FEV6FVC-%Pred-Pre: 103 %
FVC-%Change-Post: 0 %
FVC-%Pred-Post: 67 %
FVC-%Pred-Pre: 67 %
FVC-Post: 2.51 L
FVC-Pre: 2.51 L
Post FEV1/FVC ratio: 81 %
Post FEV6/FVC ratio: 100 %
Pre FEV1/FVC ratio: 79 %
Pre FEV6/FVC Ratio: 100 %
RV % pred: 84 %
RV: 1.78 L
TLC % pred: 77 %
TLC: 4.27 L

## 2018-07-27 NOTE — Patient Instructions (Addendum)
Continue on Symbicort Twice daily  . Rinse after use.  Mucinex DM Twice daily  As needed  Cough/congestion  Continue on Oxygen 2l/m .  Activity as tolerated.  Continue on Xarelto .  Work on healthy weight loss.  Follow up Dr. Elsworth Soho in 3-4  months and As needed   Please contact office for sooner follow up if symptoms do not improve or worsen or seek emergency care

## 2018-07-27 NOTE — Progress Notes (Signed)
PFT done today. 

## 2018-07-27 NOTE — Assessment & Plan Note (Signed)
Stable on oxygen  Plan  Patient Instructions  Continue on Symbicort Twice daily  . Rinse after use.  Mucinex DM Twice daily  As needed  Cough/congestion  Continue on Oxygen 2l/m .  Activity as tolerated.  Continue on Xarelto .  Work on healthy weight loss.  Follow up Dr. Elsworth Soho in 3-4  months and As needed   Please contact office for sooner follow up if symptoms do not improve or worsen or seek emergency care

## 2018-07-27 NOTE — Assessment & Plan Note (Signed)
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 Rinse after use.  Mucinex DM Twice daily  As needed  Cough/congestion  Continue on Oxygen 2l/m .  Activity as tolerated.  Continue on Xarelto .  Work on healthy weight loss.  Follow up Dr. Elsworth Soho in 3-4  months and As needed   Please contact office for sooner follow up if symptoms do not improve or worsen or seek emergency care

## 2018-07-27 NOTE — Progress Notes (Signed)
OV with you today

## 2018-07-27 NOTE — Assessment & Plan Note (Signed)
Sarcoidosis with pulmonary and cutaneous involvement From a pulmonary standpoint she appears stable on CT chest and PFTs.    Plan  Patient Instructions  Continue on Symbicort Twice daily  . Rinse after use.  Mucinex DM Twice daily  As needed  Cough/congestion  Continue on Oxygen 2l/m .  Activity as tolerated.  Continue on Xarelto .  Work on healthy weight loss.  Follow up Dr. Elsworth Soho in 3-4  months and As needed   Please contact office for sooner follow up if symptoms do not improve or worsen or seek emergency care

## 2018-07-27 NOTE — Progress Notes (Signed)
_0  ID: [name], female    DOB: May 12, 1960, 58 y.o.   MRN: [id number]  Chief Complaint  Patient presents with   Follow-up    Sarcoid     Referring provider: Cassandria Anger, MD  HPI: 58 year-old obese womanformer smokerfollowed for pulmonary &skin sarcoidosis, lung nodules and provoked(surgery) PE June 2017, -recurrent PE/DVT 07/2016 on lifelong Xarelto  03/2016 Started onhumiradue to recurring cutaneous lesions, not responding to methotrexate ,She is now on Humira every week and prednisone has been tapered to off  Admitted 07/21/2016 for RLE DVT-had stoppedXarelto 04/2016 for prior pulmonary embolism  CT scan = chronic appearing subsegmental PE ,on Xarelto lifelong  Significant tests/ events  PFT 05/2014 >> FVC 65%, ratio 69, FEV1 58%, DLCO 62%  VQ Scan 08/17/15 >>LLL &RUL acute PE   ECHO 08/18/15 >>mild LVH , EF 55-60%, Gr 1 DD .   LE Venous Doppler 08/18/15 >>neg DVT , no bakers cyst, ?prominent inguinal lymph node measuring 3.1cm   MRI brain neg [facility] = MRI spine = mild to moderate disc protrusion/left-sided spinal stenosis-chronic pain  NPSG 12/2004 mild, RDI 13/h  SARS-CoV-2 July 23, 2018- negative  07/27/2018 Follow up : Sarcoid , PE ,  Patient returns for a 40-monthfollow-up..Marland KitchenShe has known sarcoidosis with pulmonary and cutaneous involvement.  Known history of recurrent PE on lifelong anticoagulation with Xarelto.  Patient says overall she is doing okay with her breathing.  Is on oxygen at 2 L.  She says she is had no increased shortness of breath with activities or decreased oxygen levels.  She denies any flare of cough or wheezing.  Patient had a CT chest done on July 08/03/2018 that showed stable sarcoidosis to mildly decreased.  Stable mediastinal and bilateral hilar adenopathy.  PFTs done today showed stable lung function with an FEV1 at 70%, ratio 81, FVC 67%, no significant bronchodilator response, DLCO 61%.   This is similar to improved from 2016. She says she has been having increased symptoms with her eyes and ears.  She has been seen by ophthalmology.  With 2 lesions removed.  Patient says she is unclear if this was sarcoid related.  No records are available.  She was also seen by ENT diagnosed with eustachian tube dysfunction chronic serous otitis media felt to be secondary to sarcoidosis status post tympanostomy tube placement She underwent nasal endoscopy with debridement.  She says after the procedure her hearing improved quite a bit in her left ear   Patient has known cutaneous sarcoidosis and lichen planus.  She is on Humira. Says she does still get occasional lesions.   Her BRudene Andais today .   Says that her weight has been trending up over the last 6 months with COVID-19 quarantining feels she has been eating more and less active.  We discussed healthy weight loss and activity as tolerated  Allergies  Allergen Reactions   Belsomra [Suvorexant] Other (See Comments)    FGolden Circleout of bed while asleep: "I'm waking up as I'm falling on the floor;" "Night terrors"   Enalapril Maleate Cough   Latex Hives   Nickel Other (See Comments)    Blisters  Pt has a titanium right and left knee - nickel causes skin irritations that form into blisters and sores   Other Other (See Comments)    Patient has Sarcoidosis and can't tolerate any metals   Morphine And Related Itching and Nausea Only   Doxycycline Diarrhea and Nausea And Vomiting  Hydrochlorothiazide Other (See Comments)    Low potassium levels    Hydroxychloroquine Sulfate Other (See Comments)    Vision changes    Lyrica [Pregabalin] Other (See Comments)    "Made me feel high"   Tape Other (See Comments)    Medical tape causes bruising    Immunization History  Administered Date(s) Administered   H1N1 12/24/2007   Influenza Split 10/11/2010   Influenza Whole 10/14/2007, 10/05/2008, 10/26/2009, 11/13/2011    Influenza,inj,Quad PF,6+ Mos 09/21/2012, 09/24/2013, 10/06/2014, 09/05/2015, 11/06/2016, 09/24/2017   PPD Test 11/18/2014   Pneumococcal Conjugate-13 07/30/2010, 12/29/2012   Pneumococcal Polysaccharide-23 12/23/2013   Tdap 06/28/2014    Past Medical History:  Diagnosis Date   Allergic rhinitis    ALLERGIC RHINITIS 10/26/2009   Anxiety    ANXIETY 08/14/2006   B12 DEFICIENCY 4/96/7591   Complication of anesthesia    pt has had difficulty following anesthesia with her knee in 2016-unable to care for herself afterward   Confusion    Depression    takes Lexapro daily   Diabetes mellitus without complication ([place])    was on insulin but has been off since Nov 2015 and now only takes Metformin daily   DYSPNEA 04/28/2009   with exertion   Esophageal reflux    takes Nexium daily   Fibromyalgia    Headache    last migraine 2-98yr ago;takes Topamax daily   History of shingles    Hypertension    takes Coreg daily   Insomnia    takes Nortriptyline nightly    Joint pain    Joint swelling    Left knee pain    Lichen planus    Long-term memory loss    Nocturia    OSA (obstructive sleep apnea)    doesn't use CPAP;sleep study in epic from 2006   Osteoarthritis    Osteoarthritis    Pneumonia    over 30 yrs ago   Protein calorie malnutrition ([place]    Rheumatoid arthritis ([place]    Sarcoidosis    Dr. ZLolita Patella  Short-term memory loss    Shortness of breath    TIA (transient ischemic attack)    Unsteady gait    Urinary urgency    Vitamin D deficiency    is supposed to take Vit D but can't afford it   VITAMIN D DEFICIENCY 08/25/2007    Tobacco History: Social History   Tobacco Use  Smoking Status Former Smoker   Packs/day: 0.50   Years: 10.00   Pack years: 5.00   Types: Cigarettes  Smokeless Tobacco Never Used  Tobacco Comment   quit smoking in 2004   Counseling given: Not Answered Comment: quit smoking in 2004   Outpatient  Medications Prior to Visit  Medication Sig Dispense Refill   Adalimumab (HUMIRA PEN-PS/UV/ADOL HS START) 40 MG/0.8ML PNKT Inject 40 mg into the skin once a week.     augmented betamethasone dipropionate (DIPROLENE-AF) 0.05 % ointment Apply topically 2 (two) times daily. 45 g 2   B-D ULTRA-FINE 33 LANCETS MISC Use as instructed to test sugars once  daily 100 each 12   Blood Glucose Monitoring Suppl (ONETOUCH VERIO) w/Device KIT Use to check blood sugar once a day 1 kit 0   budesonide-formoterol (SYMBICORT) 160-4.5 MCG/ACT inhaler Inhale 2 puffs into the lungs TWICE DAILY 120/4=30 10.2 g 0   carvedilol (COREG) 12.5 MG tablet Take 1 tablet (12.5 mg total) by mouth 2 (two) times daily with a meal. 180 tablet 3   clonazePAM (  KLONOPIN) 0.5 MG tablet Take 1-2 tablets (0.5-1 mg total) by mouth at bedtime as needed for anxiety. 60 tablet 3   dexlansoprazole (DEXILANT) 60 MG capsule Take 1 capsule (60 mg total) by mouth daily. 90 capsule 3   fluticasone (FLONASE) 50 MCG/ACT nasal spray Place 1 spray into both nostrils daily. 16 g 2   furosemide (LASIX) 40 MG tablet TAKE 1 TABLET BY MOUTH EVERY MORNING FOR EDEMA (Patient taking differently: Take 40 mg by mouth daily as needed for fluid or edema. ) 90 tablet 1   gabapentin (NEURONTIN) 300 MG capsule Take 1 capsule (300 mg total) by mouth 2 (two) times daily as needed (for pain). 180 capsule 1   glucose blood (ONETOUCH VERIO) test strip Use as instructed to check blood sugar once a day. 100 each 12   hydrOXYzine (ATARAX/VISTARIL) 25 MG tablet Take 1 tablet (25 mg total) by mouth every 8 (eight) hours as needed for itching. 60 tablet 1   metFORMIN (GLUCOPHAGE-XR) 500 MG 24 hr tablet TAKE 2 TABLETS(1000 MG) BY MOUTH DAILY WITH BREAKFAST 180 tablet 2   potassium chloride (K-DUR) 10 MEQ tablet Take 1 tablet (10 mEq total) by mouth daily. 90 tablet 3   topiramate (TOPAMAX) 50 MG tablet Take 1 tablet (50 mg total) by mouth 2 (two) times daily. 180  tablet 3   triamcinolone cream (KENALOG) 0.5 % Apply 1 application topically 3 (three) times daily. 30 g 0   venlafaxine XR (EFFEXOR-XR) 75 MG 24 hr capsule Take 1 capsule (75 mg total) by mouth daily with breakfast. 30 capsule 11   VENTOLIN HFA 108 (90 Base) MCG/ACT inhaler Inhale 2 puffs into the lungs every 6 (six) hours as needed for wheezing or shortness of breath. 200/8=25 (Patient taking differently: Inhale 2 puffs into the lungs every 6 (six) hours as needed for wheezing or shortness of breath. ) 18 g 11   VOLTAREN 1 % GEL Apply 2 g topically 2 (two) times daily as needed (for pain). 100/4=25 (Patient taking differently: Apply 4 g topically 4 (four) times daily as needed (pain). ) 100 g 2   XARELTO 20 MG TABS tablet Take 1 tablet (20 mg total) by mouth at bedtime. 90 tablet 3   amoxicillin-clavulanate (AUGMENTIN) 875-125 MG tablet Take 1 tablet by mouth 2 (two) times daily. 20 tablet 0   levofloxacin (LEVAQUIN) 500 MG tablet Take 1 tablet (500 mg total) by mouth daily. 7 tablet 0   moxifloxacin (AVELOX) 400 MG tablet Take 1 tablet (400 mg total) by mouth daily. 7 tablet 0   No facility-administered medications prior to visit.      Review of Systems:   Constitutional:   No  weight loss, night sweats,  Fevers, chills,  +fatigue, or  lassitude.  HEENT:   No headaches,  Difficulty swallowing,  Tooth/dental problems, or  Sore throat,                No sneezing, itching, ear ache,  +nasal congestion, post nasal drip,   CV:  No chest pain,  Orthopnea, PND, swelling in lower extremities, anasarca, dizziness, palpitations, syncope.   GI  No heartburn, indigestion, abdominal pain, nausea, vomiting, diarrhea, change in bowel habits, loss of appetite, bloody stools.   Resp:    No chest wall deformity  Skin: no rash or lesions.  GU: no dysuria, change in color of urine, no urgency or frequency.  No flank pain, no hematuria   MS:  No joint pain or swelling.  No decreased range of  motion.  No back pain.    Physical Exam  BP 140/88 (BP Location: Left Arm, Cuff Size: Large)    Pulse 63    Ht _0  (1.702 m)    Wt 284 lb (128.8 kg)    LMP 05/19/2003    SpO2 100%    BMI 44.48 kg/m   GEN: A/Ox3; pleasant , NAD, elderly , obese in wc on O2    HEENT:  [place]/AT,  EACs-clear- left ear tube noted  TMs-wnl, NOSE-clear drainage , THROAT-clear, no lesions, no postnasal drip or exudate noted.   NECK:  Supple w/ fair ROM; no JVD; normal carotid impulses w/o bruits; no thyromegaly or nodules palpated; no lymphadenopathy.    RESP  Decreased BS in bases   no accessory muscle use, no dullness to percussion  CARD:  RRR, no m/r/g, tr  peripheral edema, pulses intact, no cyanosis or clubbing.  GI:   Soft & nt; nml bowel sounds; no organomegaly or masses detected.   Musco: Warm bil, no deformities or joint swelling noted.   Neuro: alert, no focal deficits noted.    Skin: Warm, few lesions along inner wrist .    Lab Results:  CBC    Component Value Date/Time   WBC 6.2 04/07/2018 0921   RBC 4.10 04/07/2018 0921   HGB 11.5 (L) 04/07/2018 0921   HGB 13.4 12/06/2013 0930   HCT 35.3 (L) 04/07/2018 0921   HCT 43.0 12/06/2013 0930   PLT 234.0 04/07/2018 0921   PLT 216 12/06/2013 0930   MCV 86.3 04/07/2018 0921   MCV 92.1 12/06/2013 0930   MCH 29.4 02/12/2018 0320   MCHC 32.4 04/07/2018 0921   RDW 15.6 (H) 04/07/2018 0921   RDW 14.5 12/06/2013 0930   LYMPHSABS 2.4 04/07/2018 0921   LYMPHSABS 1.7 12/06/2013 0930   MONOABS 0.6 04/07/2018 0921   MONOABS 0.6 12/06/2013 0930   EOSABS 0.3 04/07/2018 0921   EOSABS 0.1 12/06/2013 0930   BASOSABS 0.1 04/07/2018 0921   BASOSABS 0.0 12/06/2013 0930    BMET    Component Value Date/Time   NA 141 04/07/2018 0921   NA 144 07/19/2015   NA 140 12/06/2013 0930   K 4.0 04/07/2018 0921   K 3.7 12/06/2013 0930   CL 107 04/07/2018 0921   CO2 27 04/07/2018 0921   CO2 24 12/06/2013 0930   GLUCOSE 132 (H) 04/07/2018 0921   GLUCOSE  110 12/06/2013 0930   BUN 17 04/07/2018 0921   BUN 12 07/19/2015   BUN 11.0 12/06/2013 0930   CREATININE 0.91 04/07/2018 0921   CREATININE 1.1 12/06/2013 0930   CALCIUM 9.3 04/07/2018 0921   CALCIUM 9.4 12/06/2013 0930   GFRNONAA >60 02/12/2018 0320   GFRAA >60 02/12/2018 0320    BNP    Component Value Date/Time   BNP 204.1 (H) 09/12/2017 1900    ProBNP    Component Value Date/Time   PROBNP 194.0 (H) 11/11/2016 1051    Imaging: Ct Chest Wo Contrast  Result Date: 07/21/2018 CLINICAL DATA:  Sarcoidosis.  Follow-up pulmonary nodules. EXAM: CT CHEST WITHOUT CONTRAST TECHNIQUE: Multidetector CT imaging of the chest was performed following the standard protocol without IV contrast. COMPARISON:  12/26/2017 chest CT. FINDINGS: Cardiovascular: Mild cardiomegaly. No significant pericardial effusion/thickening. Left anterior descending and right coronary atherosclerosis. Atherosclerotic nonaneurysmal thoracic aorta. Top-normal size main pulmonary artery (3.2 cm diameter), stable. Mediastinum/Nodes: No discrete thyroid nodules. Unremarkable esophagus. No axillary adenopathy. Coarsely calcified mildly enlarged right paratracheal,  subcarinal, AP window and bilateral hilar lymph nodes are unchanged. Representative stable calcified 1.3 cm right paratracheal node (series 2/image 47). No new pathologically enlarged mediastinal or discrete hilar nodes on this noncontrast scan. Lungs/Pleura: No pneumothorax. No pleural effusion. No acute consolidative airspace disease. Finely nodular thickening of the peribronchovascular interstitium in the mid to upper lungs with patchy peripheral peribronchovascular irregular nodular opacities, which are stable to mildly decreased, with associated stable mild parenchymal distortion. For example apical left upper lobe 1.5 x 0.5 cm opacity (series 3/image 23), decreased from 2.1 x 0.9 cm. Posterior right upper lobe 2.9 x 0.9 cm bandlike opacity (series 3/image 40), decreased  mildly from 3.1 x 1.1 cm. Anterior right lower lobe 1.0 cm nodular opacity (series 3/image 90), previously 1.0 cm using similar measurement technique, stable. No new significant pulmonary nodules. No bullous disease. Upper abdomen: Cholecystectomy. Musculoskeletal: No aggressive appearing focal osseous lesions. Moderate thoracic spondylosis. IMPRESSION: 1. Pulmonary parenchymal findings of sarcoidosis in the mid to upper lungs are stable to mildly decreased in the interval. No new or progressive pulmonary disease. 2. Calcified mediastinal and bilateral hilar adenopathy, stable, compatible with sarcoidosis. 3. Two-vessel coronary atherosclerosis. 4. Mild cardiomegaly. Aortic Atherosclerosis (ICD10-I70.0). Electronically Signed   By: Ilona Sorrel M.D.   On: 07/21/2018 15:48      PFT Results Latest Ref Rng & Units 07/27/2018 12/30/2016 05/18/2014  FVC-Pre L 2.51 2.07 2.29  FVC-Predicted Pre % 67 54 61  FVC-Post L 2.51 2.18 2.43  FVC-Predicted Post % 67 57 65  Pre FEV1/FVC % % 79 70 69  Post FEV1/FCV % % 81 68 70  FEV1-Pre L 1.99 1.46 1.58  FEV1-Predicted Pre % 68 49 54  FEV1-Post L 2.03 1.49 1.70  DLCO UNC% % 61 41 62  DLCO COR %Predicted % 84 66 91  TLC L 4.27 4.07 -  TLC % Predicted % 77 74 -  RV % Predicted % 84 92 -    No results found for: NITRICOXIDE      Assessment & Plan:   Sarcoidosis Sarcoidosis with pulmonary and cutaneous involvement From a pulmonary standpoint she appears stable on CT chest and PFTs.    Plan  Patient Instructions  Continue on Symbicort Twice daily  . Rinse after use.  Mucinex DM Twice daily  As needed  Cough/congestion  Continue on Oxygen 2l/m .  Activity as tolerated.  Continue on Xarelto .  Work on healthy weight loss.  Follow up Dr. Elsworth Soho in 3-4  months and As needed   Please contact office for sooner follow up if symptoms do not improve or worsen or seek emergency care       Chronic respiratory failure with hypoxia ([place]) Stable on  oxygen  Plan  Patient Instructions  Continue on Symbicort Twice daily  . Rinse after use.  Mucinex DM Twice daily  As needed  Cough/congestion  Continue on Oxygen 2l/m .  Activity as tolerated.  Continue on Xarelto .  Work on healthy weight loss.  Follow up Dr. 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 Rinse after use.  Mucinex DM Twice daily  As needed  Cough/congestion  Continue on Oxygen 2l/m .  Activity as tolerated.  Continue on Xarelto .  Work on healthy weight loss.  Follow up Dr. Elsworth Soho in 3-4  months and As needed   Please contact office for sooner follow up if symptoms do not improve or worsen or seek emergency care          Rexene Edison, NP 07/27/2018

## 2018-07-29 ENCOUNTER — Other Ambulatory Visit: Payer: Self-pay

## 2018-07-29 NOTE — Telephone Encounter (Signed)
This encounter was created in error - please disregard.

## 2018-07-29 NOTE — Patient Outreach (Signed)
[facility]) Care Management  07/29/2018   [name] [facility] 1960-02-13 [id number]  Subjective: Successful outreach to the patient. Two patient identifiers obtained.  The patient states that she is feeling good.  She denies any pain or falls.  She denies any shortness of breath more than usual.  She has been coughing up a lot of phlegm.  She is using her oxygen on 2 liters.  She states the left ear pain that she had earlier has resolved.  She went to ENT and they put a tube in her ear and she was able to hear better and the pain gone.  She is taking her medications as prescribed.  She states that she has gained about twenty pounds because she is bored at home and can't go out and walk like she use to due to covid 19.  She plans on working on getting the weight off.with diet and making healthier food choices.  She states she has an appointment next week on the 24th to cardiology.   Current Medications:  Current Outpatient Medications  Medication Sig Dispense Refill  . Adalimumab (HUMIRA PEN-PS/UV/ADOL HS START) 40 MG/0.8ML PNKT Inject 40 mg into the skin once a week.    [name] augmented betamethasone dipropionate (DIPROLENE-AF) 0.05 % ointment Apply topically 2 (two) times daily. 45 g 2  . B-D ULTRA-FINE 33 LANCETS MISC Use as instructed to test sugars once  daily 100 each 12  . Blood Glucose Monitoring Suppl (ONETOUCH VERIO) w/Device KIT Use to check blood sugar once a day 1 kit 0  . budesonide-formoterol (SYMBICORT) 160-4.5 MCG/ACT inhaler Inhale 2 puffs into the lungs TWICE DAILY 120/4=30 10.2 g 0  . carvedilol (COREG) 12.5 MG tablet Take 1 tablet (12.5 mg total) by mouth 2 (two) times daily with a meal. 180 tablet 3  . clonazePAM (KLONOPIN) 0.5 MG tablet Take 1-2 tablets (0.5-1 mg total) by mouth at bedtime as needed for anxiety. 60 tablet 3  . dexlansoprazole (DEXILANT) 60 MG capsule Take 1 capsule (60 mg total) by mouth daily. 90 capsule 3  . fluticasone (FLONASE) 50  MCG/ACT nasal spray Place 1 spray into both nostrils daily. 16 g 2  . furosemide (LASIX) 40 MG tablet TAKE 1 TABLET BY MOUTH EVERY MORNING FOR EDEMA (Patient taking differently: Take 40 mg by mouth daily as needed for fluid or edema. ) 90 tablet 1  . gabapentin (NEURONTIN) 300 MG capsule Take 1 capsule (300 mg total) by mouth 2 (two) times daily as needed (for pain). 180 capsule 1  . glucose blood (ONETOUCH VERIO) test strip Use as instructed to check blood sugar once a day. 100 each 12  . hydrOXYzine (ATARAX/VISTARIL) 25 MG tablet Take 1 tablet (25 mg total) by mouth every 8 (eight) hours as needed for itching. 60 tablet 1  . metFORMIN (GLUCOPHAGE-XR) 500 MG 24 hr tablet TAKE 2 TABLETS(1000 MG) BY MOUTH DAILY WITH BREAKFAST 180 tablet 2  . potassium chloride (K-DUR) 10 MEQ tablet Take 1 tablet (10 mEq total) by mouth daily. 90 tablet 3  . topiramate (TOPAMAX) 50 MG tablet Take 1 tablet (50 mg total) by mouth 2 (two) times daily. 180 tablet 3  . triamcinolone cream (KENALOG) 0.5 % Apply 1 application topically 3 (three) times daily. 30 g 0  . venlafaxine XR (EFFEXOR-XR) 75 MG 24 hr capsule Take 1 capsule (75 mg total) by mouth daily with breakfast. 30 capsule 11  . VENTOLIN HFA 108 (90 Base) MCG/ACT inhaler Inhale 2 puffs into  the lungs every 6 (six) hours as needed for wheezing or shortness of breath. 200/8=25 (Patient taking differently: Inhale 2 puffs into the lungs every 6 (six) hours as needed for wheezing or shortness of breath. ) 18 g 11  . VOLTAREN 1 % GEL Apply 2 g topically 2 (two) times daily as needed (for pain). 100/4=25 (Patient taking differently: Apply 4 g topically 4 (four) times daily as needed (pain). ) 100 g 2  . XARELTO 20 MG TABS tablet Take 1 tablet (20 mg total) by mouth at bedtime. 90 tablet 3   No current facility-administered medications for this visit.     Functional Status:  In your present state of health, do you have any difficulty performing the following activities:  04/29/2018 04/29/2018  Hearing? N N  Vision? N N  Difficulty concentrating or making decisions? N N  Walking or climbing stairs? N N  Dressing or bathing? N N  Doing errands, shopping? N N  Preparing Food and eating ? N -  Using the Toilet? N -  In the past six months, have you accidently leaked urine? N -  Do you have problems with loss of bowel control? N -  Managing your Medications? N -  Managing your Finances? N -  Housekeeping or managing your Housekeeping? N -  Some recent data might be hidden    Fall/Depression Screening: Fall Risk  07/29/2018 05/29/2018 04/29/2018  Falls in the past year? 0 0 0  Number falls in past yr: - - 0  Injury with Fall? - - 0  Risk Factor Category  - - -  Risk for fall due to : - - -  Follow up - - -   PHQ 2/9 Scores 04/29/2018 04/29/2018 11/07/2017 10/06/2017 10/02/2017 02/17/2017 01/15/2017  PHQ - 2 Score 4 4 3  0 0 2 1  PHQ- 9 Score 19 19 8  - - 7 -    Assessment: Patient will continue to benefit from health coach outreach for disease management and support. [facility] CM Care Plan Problem One     Most Recent Value  [facility] Long Term Goal   In 90 days the patient will not have and readmissons to the hospital for COPD exacerbation  [facility] Long Term Goal Start Date  07/29/18  Interventions for Problem One Long Term Goal  Talked about the signs and symptoms of COPD, encouraged medication adherence, encouraged exercise as tolerated and continue to use safety precautions when leaving the home     Plan: Young Harris will contact patient in the month of October and patient agrees to next outreach.    Lazaro Arms RN, BSN, [place] Direct Dial:  [phone]  Fax: [phone]

## 2018-08-03 ENCOUNTER — Telehealth: Payer: Self-pay | Admitting: Internal Medicine

## 2018-08-03 NOTE — Telephone Encounter (Signed)
Please schedule a virtual office visit.  Thank you

## 2018-08-03 NOTE — Telephone Encounter (Signed)
Pt called and is requesting antibiotics be sent in for her. Pt states her symptoms stopped and are now back. Pt is requesting a stronger antibiotic. Please advise.   [place], [place]  [place] [address]  Phone: [phone] Fax: [phone]  Not a 24 hour pharmacy; exact hours not known

## 2018-08-04 NOTE — Telephone Encounter (Signed)
Left message for patient to call back to schedule a virtual OV with PCP.

## 2018-08-05 ENCOUNTER — Ambulatory Visit (INDEPENDENT_AMBULATORY_CARE_PROVIDER_SITE_OTHER): Payer: Medicare Other | Admitting: Internal Medicine

## 2018-08-05 DIAGNOSIS — J449 Chronic obstructive pulmonary disease, unspecified: Secondary | ICD-10-CM | POA: Diagnosis not present

## 2018-08-05 DIAGNOSIS — E1122 Type 2 diabetes mellitus with diabetic chronic kidney disease: Secondary | ICD-10-CM

## 2018-08-05 DIAGNOSIS — I2782 Chronic pulmonary embolism: Secondary | ICD-10-CM | POA: Diagnosis not present

## 2018-08-05 DIAGNOSIS — N182 Chronic kidney disease, stage 2 (mild): Secondary | ICD-10-CM | POA: Diagnosis not present

## 2018-08-05 DIAGNOSIS — J0101 Acute recurrent maxillary sinusitis: Secondary | ICD-10-CM

## 2018-08-05 DIAGNOSIS — I10 Essential (primary) hypertension: Secondary | ICD-10-CM

## 2018-08-05 MED ORDER — FLUCONAZOLE 150 MG TABLET
ORAL_TABLET | Freq: Once | ORAL | 1 refills | 1.00000 days
Start: 2018-08-05 — End: 2018-08-05

## 2018-08-05 MED ORDER — AMOXICILLIN 875 MG-POTASSIUM CLAVULANATE 125 MG TABLET
ORAL_TABLET | 0 refills | 0 days
Start: 2018-08-05 — End: ?

## 2018-08-05 MED ORDER — MUPIROCIN 2 % TOPICAL OINTMENT
0 refills | 0 days
Start: 2018-08-05 — End: ?

## 2018-08-05 MED ORDER — MUPIROCIN 2 % EX OINT: TOPICAL_OINTMENT | CUTANEOUS | 0 refills | Status: DC

## 2018-08-05 MED ORDER — AMOXICILLIN-POT CLAVULANATE 875-125 MG PO TABS: 1.0000 | ORAL_TABLET | Freq: Two times a day (BID) | ORAL | 0 refills | Status: DC

## 2018-08-05 MED ORDER — FLUCONAZOLE 150 MG PO TABS: 150.0000 mg | ORAL_TABLET | Freq: Once | ORAL | 1 refills | Status: DC

## 2018-08-05 NOTE — Assessment & Plan Note (Signed)
Augmentin x 3 weeks

## 2018-08-05 NOTE — Progress Notes (Signed)
Virtual Visit via Telephone Note  I connected with [name] on 08/05/18 at  3:20 PM EDT by telephone and verified that I am speaking with the correct person using two identifiers.   I discussed the limitations, risks, security and privacy concerns of performing an evaluation and management service by telephone and the availability of in person appointments. I also discussed with the patient that there may be a patient responsible charge related to this service. The patient expressed understanding and agreed to proceed.   History of Present Illness: The patient is complaining of severe nasal congestion with thick mucus production.  The patient states the symptoms are similar to her recent sinusitis flareup.  No fever. Follow-up hypertension, diabetes, COPD   Observations/Objective: The patient sounds fairly normal on the phone  Assessment and Plan:  See plan Follow Up Instructions:    I discussed the assessment and treatment plan with the patient. The patient was provided an opportunity to ask questions and all were answered. The patient agreed with the plan and demonstrated an understanding of the instructions.   The patient was advised to call back or seek an in-person evaluation if the symptoms worsen or if the condition fails to improve as anticipated.  I provided 21 minutes of non-face-to-face time during this encounter.   Walker Kehr, MD

## 2018-08-05 NOTE — Assessment & Plan Note (Signed)
Xarelto

## 2018-08-06 ENCOUNTER — Telehealth: Payer: Self-pay | Admitting: Cardiology

## 2018-08-06 NOTE — Telephone Encounter (Signed)
Called patient to ask covid 19 pre-screening questions. LMOM for pt to return call to confirm.

## 2018-08-07 ENCOUNTER — Ambulatory Visit: Payer: Medicare Other | Admitting: Cardiology

## 2018-08-10 ENCOUNTER — Encounter: Payer: Self-pay | Admitting: Internal Medicine

## 2018-08-10 ENCOUNTER — Ambulatory Visit: Payer: Self-pay | Admitting: Internal Medicine

## 2018-08-10 ENCOUNTER — Telehealth: Payer: Self-pay | Admitting: Internal Medicine

## 2018-08-10 DIAGNOSIS — H5789 Other specified disorders of eye and adnexa: Secondary | ICD-10-CM

## 2018-08-10 MED ORDER — AMOXICILLIN-POT CLAVULANATE 875-125 MG PO TABS: 1.0000 | ORAL_TABLET | Freq: Two times a day (BID) | ORAL | 0 refills | Status: DC

## 2018-08-10 NOTE — Telephone Encounter (Signed)
Routing to CMA 

## 2018-08-10 NOTE — Assessment & Plan Note (Signed)
On O2 

## 2018-08-10 NOTE — Telephone Encounter (Signed)
Pt is calling and needs amoxicillin-cla , fluconazole and mupirocine 2% [facility] in [place]

## 2018-08-10 NOTE — Assessment & Plan Note (Signed)
Coreg Lasix

## 2018-08-10 NOTE — Telephone Encounter (Signed)
Pt reports had appt with Dr. Alain Marion  08/05/2018, "Sinus infection."  States did not get antibiotic prescribed as sent to wrong pharmacy. Reports woke up Saturday with "Eye matted shut and white of my eye blood red." Reports constant tearing of eye, denies itching. States drainage is clear.Also states notes "Little bubble on eyeball."  Reports upper eyelid slightly swollen, slightly tender to touch "When I press on my eyeball at my eyelid, feels different than other other eye."  Denies redness, warmth surrounding eye. Also reports sinus "Pressure from forehead to mouth."  TN called practice, [facility], who spoke with Dr. Judeen Hammans nurse.  Advised to pick up and start antibiotic today, CB if symptoms persist next day or so and see ophthalmologist  if she is established with one.   Pt states she is not and would like a referral to see Dr. Midge Aver.   States she will start antibiotics today. Advised to CB if symptoms worsen, unresolved within next 2 days. Pt verbalizes understanding, st Reason for Disposition . Bleeding on white of the eye  Answer Assessment - Initial Assessment Questions 1. LOCATION: Location: "What's red, the eyeball or the outer eyelids?" (Note: when callers say the eye is red, they usually mean the sclera is red)       Sclera "Blood red" left eye 2. REDNESS OF SCLERA: "Is the redness in one or both eyes?" "When did the redness start?"      Left eye 3. ONSET: "When did the eye become red?" (e.g., hours, days)      Saturday 4. EYELIDS: "Are the eyelids red or swollen?" If so, ask: "How much?"      Mild swelling of upper lid 5. VISION: "Is there any difficulty seeing clearly?"      Because its teary 6. ITCHING: "Does it feel itchy?" If so ask: "How bad is it" (e.g., Scale 1-10; or mild, moderate, severe)     no 7. PAIN: "Is there any pain? If so, ask: "How bad is it?" (e.g., Scale 1-10; or mild, moderate, severe)     MIld at eye lid, with touch 8. CONTACT LENS: "Do you wear  contacts?"     no 9. CAUSE: "What do you think is causing the redness?"     "Had sinus infection last week. 10. OTHER SYMPTOMS: "Do you have any other symptoms?" (e.g., fever, runny nose, cough, vomiting)       no  Protocols used: EYE - RED WITHOUT PUS-A-AH

## 2018-08-10 NOTE — Assessment & Plan Note (Signed)
Cont w/Metformin, Januvia

## 2018-08-11 MED ORDER — POLYMYXIN B-TRIMETHOPRIM 10000-0.1 UNIT/ML-% OP SOLN: 1.0000 [drp] | OPHTHALMIC | 0 refills | Status: AC

## 2018-08-11 MED ORDER — MUPIROCIN 2 % EX OINT: TOPICAL_OINTMENT | CUTANEOUS | 0 refills | Status: DC

## 2018-08-11 MED ORDER — FLUCONAZOLE 150 MG PO TABS: 150.0000 mg | ORAL_TABLET | Freq: Once | ORAL | 1 refills | Status: DC

## 2018-08-11 NOTE — Telephone Encounter (Signed)
All meds have been sent. See 6/96/29 duplicate message.

## 2018-08-11 NOTE — Telephone Encounter (Signed)
Email eyedrops to correct pharm too. Eliminate incorrect pharmacies from list pls Thx

## 2018-08-11 NOTE — Telephone Encounter (Signed)
Pt called and stated that she still does not habe Fluconazole or Mupirocine 2% called in to pharmacy. Please advise

## 2018-08-11 NOTE — Telephone Encounter (Signed)
All requested rxs sent. See meds. Pharmacy updated. Pt informed.   Patient is requesting referral to Dr. Katy Fitch.

## 2018-08-12 NOTE — Telephone Encounter (Signed)
Ok Thx 

## 2018-08-17 NOTE — Unmapped (Signed)
[facility] Refill Coordination Note    Specialty Medication(s) to be Shipped:   Inflammatory Disorders: Humira    Other medication(s) to be shipped: na     [facility]yn [facility], DOB: 1960/04/27  Phone: [phone] (home)       All above HIPAA information was verified with patient.     Completed refill call assessment today to schedule patient's medication shipment from the [facility] [phone]).       Specialty medication(s) and dose(s) confirmed: Regimen is correct and unchanged.   Changes to medications: [facility] reports no changes at this time.  Changes to insurance: No  Questions for the pharmacist: No    Confirmed patient received Welcome Packet with first shipment. The patient will receive a drug information handout for each medication shipped and additional FDA Medication Guides as required.       DISEASE/MEDICATION-SPECIFIC INFORMATION        For patients on injectable medications: Patient currently has 2 doses left.  Next injection is scheduled for 8/5 and 8/12.    SPECIALTY MEDICATION ADHERENCE     Medication Adherence    Patient reported X missed doses in the last month: 0  Specialty Medication: humira cf 40 mg/0.4 ml  Patient is on additional specialty medications: No  Patient is on more than two specialty medications: No  Any gaps in refill history greater than 2 weeks in the last 3 months: no  Demonstrates understanding of importance of adherence: yes  Informant: patient  Reliability of informant: reliable  Confirmed plan for next specialty medication refill: delivery by [facility]  Refills needed for supportive medications: not needed          humira cf 40 mg/0.4 ml. 2 doses on hand      SHIPPING     Shipping address confirmed in Epic.     Delivery Scheduled: Yes, Expected medication delivery date: 081220.     Medication will be delivered via UPS to the home address in Epic WAM.    [facility]yn [facility]   [facility] Shared [facility] Specialty Technician

## 2018-08-21 DIAGNOSIS — D869 Sarcoidosis, unspecified: Secondary | ICD-10-CM | POA: Diagnosis not present

## 2018-08-21 DIAGNOSIS — H6522 Chronic serous otitis media, left ear: Secondary | ICD-10-CM | POA: Diagnosis not present

## 2018-08-21 DIAGNOSIS — J3489 Other specified disorders of nose and nasal sinuses: Secondary | ICD-10-CM | POA: Diagnosis not present

## 2018-08-21 DIAGNOSIS — Z9622 Myringotomy tube(s) status: Secondary | ICD-10-CM | POA: Insufficient documentation

## 2018-08-21 DIAGNOSIS — J019 Acute sinusitis, unspecified: Secondary | ICD-10-CM | POA: Diagnosis not present

## 2018-08-25 MED FILL — HUMIRA PEN CITRATE FREE 40 MG/0.4 ML: 28 days supply | Qty: 4 | Fill #5

## 2018-08-25 MED FILL — HUMIRA PEN CITRATE FREE 40 MG/0.4 ML: 28 days supply | Qty: 4 | Fill #5 | Status: AC

## 2018-08-27 ENCOUNTER — Encounter: Payer: Self-pay | Admitting: Cardiology

## 2018-08-27 ENCOUNTER — Other Ambulatory Visit: Payer: Self-pay

## 2018-08-27 ENCOUNTER — Ambulatory Visit (INDEPENDENT_AMBULATORY_CARE_PROVIDER_SITE_OTHER): Payer: Medicare Other | Admitting: Cardiology

## 2018-08-27 VITALS — BP 124/80 | HR 68 | Ht 67.0 in | Wt 285.0 lb

## 2018-08-27 DIAGNOSIS — D869 Sarcoidosis, unspecified: Secondary | ICD-10-CM | POA: Diagnosis not present

## 2018-08-27 DIAGNOSIS — E785 Hyperlipidemia, unspecified: Secondary | ICD-10-CM

## 2018-08-27 DIAGNOSIS — I10 Essential (primary) hypertension: Secondary | ICD-10-CM | POA: Diagnosis not present

## 2018-08-27 NOTE — Patient Instructions (Addendum)
Medication Instructions:  Your physician recommends that you continue on your current medications as directed. Please refer to the Current Medication list given to you today.  If you need a refill on your cardiac medications before your next appointment, please call your pharmacy.   Lab work: TODAY:  LIPID & CMET  If you have labs (blood work) drawn today and your tests are completely normal, you will receive your results only by: [name] MyChart Message (if you have MyChart) OR . A paper copy in the mail If you have any lab test that is abnormal or we need to change your treatment, we will call you to review the results.  Testing/Procedures: None ordered  Follow-Up: At [facility], you and your health needs are our priority.  As part of our continuing mission to provide you with exceptional heart care, we have created designated Provider Care Teams.  These Care Teams include your primary Cardiologist (physician) and Advanced Practice Providers (APPs -  Physician Assistants and Nurse Practitioners) who all work together to provide you with the care you need, when you need it. You will need a follow up appointment in 6 months.  Please call our office 2 months in advance to schedule this appointment.  You may see Jenkins Rouge, MD or one of the following Advanced Practice Providers on your designated Care Team:   Truitt Merle, NP Cecilie Kicks, NP . Kathyrn Drown, NP  Any Other Special Instructions Will Be Listed Below (If Applicable).

## 2018-08-27 NOTE — Progress Notes (Signed)
Virtual Visit via Video Note   This visit type was conducted due to national recommendations for restrictions regarding the COVID-19 Pandemic (e.g. social distancing) in an effort to limit this patient's exposure and mitigate transmission in our community.  Due to her co-morbid illnesses, this patient is at least at moderate risk for complications without adequate follow up.  This format is felt to be most appropriate for this patient at this time.  All issues noted in this document were discussed and addressed.  A limited physical exam was performed with this format.  Please refer to the patient's chart for her consent to telehealth for [facility].   Date:  08/27/2018   ID:  [name], DOB 09-14-1960, MRN [id number]  Patient Location: Other:  office Provider Location: Home  PCP:  Cassandria Anger, MD  Cardiologist:  Jenkins Rouge, MD  Electrophysiologist:  None   Evaluation Performed:  Follow-Up Visit  Chief Complaint:    History of Present Illness:    [name] is a 58 y.o. 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 Frequent flairs of respiratory distress requiring antibiotics and steroids Also on 2L oxygen [place] chest stable on 12/26/17 Previous smoker but not now. After week Rx Augmentin cleared by pulmonary to have surgery 01/15/18  She has had percutaneous drain in [place] since August 2019 and is scheduled for cholecystectomy in January   She was seen by me in September 2014 for atypical chest pain and had normal lexiscan myvovue   TTE reviewed from 11/21/16 EF 60-65% no significant valve disease or signs of pulmonary HTN  She has no chest pain or cardiac complaints  no hx of CAD, DVT/PE on xarelto   Today she has had surgery and is doing well. No chest pain and no SOB.   The patient does not have symptoms concerning for COVID-19 infection  (fever, chills, cough, or new shortness of breath).    Past Medical History:  Diagnosis Date  . Allergic rhinitis   . ALLERGIC RHINITIS 10/26/2009  . Anxiety   . ANXIETY 08/14/2006  . B12 DEFICIENCY 08/25/2007  . Complication of anesthesia    pt has had difficulty following anesthesia with her knee in 2016-unable to care for herself afterward  . Confusion   . Depression    takes Lexapro daily  . Diabetes mellitus without complication ([place])    was on insulin but has been off since Nov 2015 and now only takes Metformin daily  . DYSPNEA 04/28/2009   with exertion  . Esophageal reflux    takes Nexium daily  . Fibromyalgia   . Headache    last migraine 2-108yr ago;takes Topamax daily  . History of shingles   . Hypertension    takes Coreg daily  . Insomnia    takes Nortriptyline nightly   . Joint pain   . Joint swelling   . Left knee pain   . Lichen planus   . Long-term memory loss   . Nocturia   . OSA (obstructive sleep apnea)    doesn't use CPAP;sleep study in epic from 2006  . Osteoarthritis   . Osteoarthritis   . Pneumonia    over 30 yrs ago  . Protein calorie malnutrition ([place]   . Rheumatoid arthritis ([place]   . Sarcoidosis    Dr. ZLolita Patella . Short-term memory loss   . Shortness of breath   .  TIA (transient ischemic attack)   . Unsteady gait   . Urinary urgency   . Vitamin D deficiency    is supposed to take Vit D but can't afford it  . VITAMIN D DEFICIENCY 08/25/2007   Past Surgical History:  Procedure Laterality Date  . APPENDECTOMY    . arthroscopic knee surgery Right 11-12-04  . AXILLARY ABCESS IRRIGATION AND DEBRIDEMENT  Jul & Aug2012  . CARPAL TUNNEL RELEASE Left 05/23/2014   Procedure: CARPAL TUNNEL RELEASE;  Surgeon: Meredith Pel, MD;  Location: [place];  Service: Orthopedics;  Laterality: Left;  . CHOLECYSTECTOMY  02/11/2018   LAPROSCOPIC  . CHOLECYSTECTOMY N/A 02/11/2018   Procedure: LAPAROSCOPIC CHOLECYSTECTOMY WITH INTRAOPERATIVE CHOLANGIOGRAM  ERAS PATHWAY;  Surgeon: Donnie Mesa, MD;  Location: [place];  Service: General;  Laterality: N/A;  . cyst removed from top of buttocks  at age 61  . ENDOMETRIAL ABLATION    . IR CHOLANGIOGRAM EXISTING TUBE  11/21/2017  . IR CHOLANGIOGRAM EXISTING TUBE  01/16/2018  . IR EXCHANGE BILIARY DRAIN  11/10/2017  . IR PATIENT EVAL TECH 0-60 MINS  02/03/2018  . IR PERC CHOLECYSTOSTOMY  09/13/2017  . IR RADIOLOGIST EVAL & MGMT  10/08/2017  . LACRIMAL DUCT EXPLORATION Right 06/26/2017   Procedure: LACRIMAL DUCT EXPLORATION AND ETHMOIDECTOMY;  Surgeon: Clista Bernhardt, MD;  Location: [place];  Service: Ophthalmology;  Laterality: Right;  . TEAR DUCT PROBING Right 06/26/2017   Procedure: TEAR DUCT PROBING WITH STENT;  Surgeon: Clista Bernhardt, MD;  Location: [place];  Service: Ophthalmology;  Laterality: Right;  . TOTAL KNEE ARTHROPLASTY Right 11/15/2014   Procedure: TOTAL RIGHT KNEE ARTHROPLASTY;  Surgeon: Meredith Pel, MD;  Location: [place];  Service: Orthopedics;  Laterality: Right;  . TOTAL KNEE ARTHROPLASTY Left 07/13/2015   Procedure: LEFT TOTAL KNEE ARTHROPLASTY;  Surgeon: Meredith Pel, MD;  Location: [place];  Service: Orthopedics;  Laterality: Left;     Current Meds  Medication Sig  . Adalimumab (HUMIRA PEN-PS/UV/ADOL HS START) 40 MG/0.8ML PNKT Inject 40 mg into the skin once a week.  . B-D ULTRA-FINE 33 LANCETS MISC Use as instructed to test sugars once  daily  . Blood Glucose Monitoring Suppl (ONETOUCH VERIO) w/Device KIT Use to check blood sugar once a day  . budesonide-formoterol (SYMBICORT) 160-4.5 MCG/ACT inhaler Inhale 2 puffs into the lungs TWICE DAILY 120/4=30  . carvedilol (COREG) 12.5 MG tablet Take 1 tablet (12.5 mg total) by mouth 2 (two) times daily with a meal.  . ciprofloxacin (CIPRO) 500 MG tablet Take by mouth.  . clonazePAM (KLONOPIN) 0.5 MG tablet Take 1-2 tablets (0.5-1 mg total) by mouth at bedtime as needed for anxiety.  [name] dexlansoprazole (DEXILANT) 60 MG capsule Take  1 capsule (60 mg total) by mouth daily.  . furosemide (LASIX) 40 MG tablet TAKE 1 TABLET BY MOUTH EVERY MORNING FOR EDEMA (Patient taking differently: Take 40 mg by mouth daily as needed for fluid or edema. )  . gabapentin (NEURONTIN) 300 MG capsule Take 1 capsule (300 mg total) by mouth 2 (two) times daily as needed (for pain).  [name] glucose blood (ONETOUCH VERIO) test strip Use as instructed to check blood sugar once a day.  . hydrOXYzine (ATARAX/VISTARIL) 25 MG tablet Take 1 tablet (25 mg total) by mouth every 8 (eight) hours as needed for itching.  . metFORMIN (GLUCOPHAGE-XR) 500 MG 24 hr tablet TAKE 2 TABLETS(1000 MG) BY MOUTH DAILY WITH BREAKFAST  . mupirocin ointment (BACTROBAN) 2 % Use qid  on nose sores  . potassium chloride (K-DUR) 10 MEQ tablet Take 1 tablet (10 mEq total) by mouth daily.  [name] topiramate (TOPAMAX) 50 MG tablet Take 1 tablet (50 mg total) by mouth 2 (two) times daily.  [name] triamcinolone cream (KENALOG) 0.5 % Apply 1 application topically 3 (three) times daily.  [name] venlafaxine XR (EFFEXOR-XR) 75 MG 24 hr capsule Take 1 capsule (75 mg total) by mouth daily with breakfast.  . VENTOLIN HFA 108 (90 Base) MCG/ACT inhaler Inhale 2 puffs into the lungs every 6 (six) hours as needed for wheezing or shortness of breath. 200/8=25 (Patient taking differently: Inhale 2 puffs into the lungs every 6 (six) hours as needed for wheezing or shortness of breath. )  . VOLTAREN 1 % GEL Apply 2 g topically 2 (two) times daily as needed (for pain). 100/4=25 (Patient taking differently: Apply 4 g topically 4 (four) times daily as needed (pain). )  . XARELTO 20 MG TABS tablet Take 1 tablet (20 mg total) by mouth at bedtime.     Allergies:   Belsomra [suvorexant], Enalapril maleate, Latex, Nickel, Other, Morphine and related, Doxycycline, Hydrochlorothiazide, Hydroxychloroquine sulfate, Lyrica [pregabalin], and Tape   Social History   Tobacco Use  . Smoking status: Former Smoker    Packs/day: 0.50     Years: 10.00    Pack years: 5.00    Types: Cigarettes  . Smokeless tobacco: Never Used  . Tobacco comment: quit smoking in 2004  Substance Use Topics  . Alcohol use: Yes    Alcohol/week: 1.0 standard drinks    Types: 1 Glasses of wine per week    Comment: occassionaly  . Drug use: No     Family Hx: The patient's family history includes Breast cancer in her sister and another family member; Cancer in her father; Coronary artery disease in some other family members; Heart disease in her mother; Heart failure in an other family member; Hypertension in an other family member; Kidney disease in her mother.  ROS:   Please see the history of present illness.    General:no colds or fevers, no weight changes Skin:no rashes or ulcers HEENT:no blurred vision, no congestion CV:see HPI PUL:see HPI GI:no diarrhea constipation or melena, no indigestion GU:no hematuria, no dysuria MS:no joint pain, no claudication Neuro:no syncope, no lightheadedness Endo:+ diabetes, no thyroid disease  All other systems reviewed and are negative.   Prior CV studies:   The following studies were reviewed today:  ECHO 11/2016 Study Conclusions  - Left ventricle: The cavity size was normal. Wall thickness was   increased in a pattern of mild LVH. Systolic function was normal.   The estimated ejection fraction was in the range of 60% to 65%.   Although no diagnostic regional wall motion abnormality was   identified, this possibility cannot be completely excluded on the   basis of this study. Doppler parameters are consistent with   abnormal left ventricular relaxation (grade 1 diastolic   dysfunction). - Aortic valve: There was no stenosis. - Mitral valve: There was no significant regurgitation. - Right ventricle: The cavity size was normal. Systolic function   was normal. - Pulmonary arteries: No complete TR doppler jet so unable to   estimate PA systolic pressure. - Systemic veins: IVC not  visualized.  Impressions:  - Normal LV size with mild LV hypertrophy. EF 60-65%. Normal RV   size and systolic function. No significant valvular   abnormalities.   Labs/Other Tests and Data Reviewed:  EKG:  An ECG dated 01/23/18 was personally reviewed today and demonstrated:  normal EKG  Recent Labs: 09/12/2017: B Natriuretic Peptide 204.1 10/15/2017: Magnesium 2.0 02/12/2018: ALT 85 04/07/2018: BUN 17; Creatinine, Ser 0.91; Hemoglobin 11.5; Platelets 234.0; Potassium 4.0; Sodium 141   Recent Lipid Panel Lab Results  Component Value Date/Time   CHOL 237 (H) 06/30/2017 03:41 PM   TRIG 271.0 (H) 06/30/2017 03:41 PM   HDL 70.00 06/30/2017 03:41 PM   CHOLHDL 3 06/30/2017 03:41 PM   LDLCALC 97 06/29/2008 09:08 AM   LDLDIRECT 107.0 06/30/2017 03:41 PM    Wt Readings from Last 3 Encounters:  08/27/18 285 lb (129.3 kg)  07/27/18 284 lb (128.8 kg)  04/07/18 268 lb (121.6 kg)     Objective:    Vital Signs:  BP 124/80   Pulse 68   Ht 5' 7"  (1.702 m)   Wt 285 lb (129.3 kg)   LMP 05/19/2003   SpO2 97%   BMI 44.64 kg/m    VITAL SIGNS:  reviewed  General NAD Lungs, can speak complete sentences without SOB  Neuro A&O X 3 MAE follows commands Psych pleasant affect   ASSESSMENT & PLAN:    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 Sarcoid followed by pulmonary  3. HLD check lipids 4.   COVID-19 Education: The signs and symptoms of COVID-19 were discussed with the patient and how to seek care for testing (follow up with PCP or arrange E-visit).  The importance of social distancing was discussed today.  Time:   Today, I have spent 10 minutes with the patient with telehealth technology discussing the above problems.     Medication Adjustments/Labs and Tests Ordered: Current medicines are reviewed at length with the patient today.  Concerns regarding medicines are outlined above.   Tests Ordered: No orders of the defined types were placed in this encounter.   Medication Changes: No  orders of the defined types were placed in this encounter.   Follow Up:  In Person in 6 month(s)  Signed, Cecilie Kicks, NP  08/27/2018 3:09 PM    [facility]

## 2018-08-28 LAB — LIPID PANEL
Chol/HDL Ratio: 2.5 ratio (ref 0.0–4.4)
Cholesterol, Total: 220 mg/dL — ABNORMAL HIGH (ref 100–199)
HDL: 88 mg/dL (ref >39)
LDL Calculated: 111 mg/dL — ABNORMAL HIGH (ref 0–99)
Triglycerides: 107 mg/dL (ref 0–149)
VLDL Cholesterol Cal: 21 mg/dL (ref 5–40)

## 2018-08-28 LAB — COMPREHENSIVE METABOLIC PANEL
ALT: 23 IU/L (ref 0–32)
AST: 19 IU/L (ref 0–40)
Albumin/Globulin Ratio: 1.3 (ref 1.2–2.2)
Albumin: 3.9 g/dL (ref 3.8–4.9)
Alkaline Phosphatase: 96 IU/L (ref 39–117)
BUN/Creatinine Ratio: 20 (ref 9–23)
BUN: 18 mg/dL (ref 6–24)
Bilirubin Total: <0.2 mg/dL (ref 0.0–1.2)
CO2: 23 mmol/L (ref 20–29)
Calcium: 9.2 mg/dL (ref 8.7–10.2)
Chloride: 108 mmol/L — ABNORMAL HIGH (ref 96–106)
Creatinine, Ser: 0.9 mg/dL (ref 0.57–1.00)
GFR calc Af Amer: 82 mL/min/{1.73_m2} (ref >59)
GFR calc non Af Amer: 71 mL/min/{1.73_m2} (ref >59)
Globulin, Total: 3.1 g/dL (ref 1.5–4.5)
Glucose: 87 mg/dL (ref 65–99)
Potassium: 4.4 mmol/L (ref 3.5–5.2)
Sodium: 143 mmol/L (ref 134–144)
Total Protein: 7 g/dL (ref 6.0–8.5)

## 2018-08-28 NOTE — Progress Notes (Signed)
Pt has been made aware of normal result and verbalized understanding.  jw 08/28/2018

## 2018-08-31 ENCOUNTER — Other Ambulatory Visit: Payer: Self-pay | Admitting: Internal Medicine

## 2018-09-01 DIAGNOSIS — H40013 Open angle with borderline findings, low risk, bilateral: Secondary | ICD-10-CM | POA: Diagnosis not present

## 2018-09-01 DIAGNOSIS — H5712 Ocular pain, left eye: Secondary | ICD-10-CM | POA: Diagnosis not present

## 2018-09-01 DIAGNOSIS — E119 Type 2 diabetes mellitus without complications: Secondary | ICD-10-CM | POA: Diagnosis not present

## 2018-09-01 DIAGNOSIS — H2513 Age-related nuclear cataract, bilateral: Secondary | ICD-10-CM | POA: Diagnosis not present

## 2018-09-01 LAB — HM DIABETES EYE EXAM

## 2018-09-04 ENCOUNTER — Ambulatory Visit: Payer: Medicare Other | Admitting: Internal Medicine

## 2018-09-07 ENCOUNTER — Other Ambulatory Visit: Payer: Self-pay | Admitting: Internal Medicine

## 2018-09-07 ENCOUNTER — Encounter: Payer: Self-pay | Admitting: Internal Medicine

## 2018-09-10 DIAGNOSIS — J3489 Other specified disorders of nose and nasal sinuses: Secondary | ICD-10-CM | POA: Diagnosis not present

## 2018-09-10 DIAGNOSIS — Z9622 Myringotomy tube(s) status: Secondary | ICD-10-CM | POA: Diagnosis not present

## 2018-09-10 DIAGNOSIS — Z87891 Personal history of nicotine dependence: Secondary | ICD-10-CM | POA: Diagnosis not present

## 2018-09-10 DIAGNOSIS — D869 Sarcoidosis, unspecified: Secondary | ICD-10-CM | POA: Diagnosis not present

## 2018-09-15 NOTE — Unmapped (Signed)
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  She is not using any drugs for this, has appt with Neurologist on 10/08.  Dr Janyth Contes says its okay to continue her Humira at this time and to let him know if the neurologist says differently.  Talked to patient and she understands situation and will call Dr Janyth Contes after appt if needed.    [facility] Clinical Assessment & Refill Coordination Note    Carolyn Newton, DOB: [birth year]  Phone: [phone] (home)     All above HIPAA information was verified with patient.     Specialty Medication(s):   Inflammatory Disorders: Humira     Current Outpatient Medications   Medication Sig Dispense Refill   ??? ADALIMUMAB PEN CITRATE FREE 40 MG/0.4 ML Inject the contents of 1 pen (40 mg) under the skin weekly 4 each 11   ??? albuterol (PROVENTIL HFA;VENTOLIN HFA) 90 mcg/actuation inhaler Inhale 2 puffs.     ??? amoxicillin-clavulanate (AUGMENTIN) 875-125 mg per tablet Take 1 tablet by mouth every twelve (12) hours.     ??? amoxicillin-clavulanate (AUGMENTIN) 875-125 mg per tablet Take 1 tablet by mouth 2 (two) times daily. 42 tablet 0   ??? blood sugar diagnostic (FREESTYLE LITE STRIPS) Strp TEST TWICE DAILY     ??? blood sugar diagnostic (ON CALL EXPRESS TEST STRIP) Strp Use as instructed to check blood sugar once a day. 100 each 12   ??? blood-glucose meter (FREESTYLE LITE METER) kit Use to check blood sugar 2 times per day     ??? blood-glucose meter (ON CALL EXPRESS METER) Misc Use to check blood sugar once a day 1 each 0   ??? carvedilol (COREG) 12.5 MG tablet TAKE 1 TABLET BY MOUTH TWICE DAILY WITH A MEAL     ??? chlorhexidine (PERIDEX) 0.12 % solution 5 mL.     ??? clobetasoL (TEMOVATE) 0.05 % cream Apply topically Two (2) times a day. 60 g 5   ??? clonazePAM (KLONOPIN) 0.5 MG tablet      ??? diclofenac sodium (VOLTAREN) 1 % gel Apply 2 g topically 2 (two) times daily as needed (for pain). 100/4=25     ??? docusate sodium (COLACE) 100 MG capsule Take 100 mg by mouth.     ??? empty container Misc USE AS DIRECTED 1 each 2   ??? escitalopram oxalate (LEXAPRO) 20 MG tablet TAKE 1 TABLET BY MOUTH DAILY     ??? esomeprazole (NEXIUM) 40 MG capsule Take 40 mg by mouth.     ??? fluocinonide (LIDEX) 0.05 % cream Apply 1 application topically Two (2) times a day. 60 g 5   ??? fluticasone propionate (FLONASE) 50 mcg/actuation nasal spray 1 spray into each nostril.     ??? furosemide (LASIX) 20 MG tablet Take 20 mg by mouth.     ??? gabapentin (NEURONTIN) 300 MG capsule Take 1 capsule (300 mg total) by mouth 2 (two) times daily as needed (for pain).  1   ??? halobetasol (ULTRAVATE) 0.05 % ointment Apply topically Two (2) times a day. 50 g 5   ??? hydrOXYzine (ATARAX) 25 MG tablet Take 25 mg by mouth.     ??? lancets (FREESTYLE) 28 gauge Misc 1 each.     ??? lancets 30 gauge Misc Use as instructed to test sugars once daily 100 each 12   ??? levoFLOXacin (LEVAQUIN) 750 MG tablet Take 750 mg by mouth.     ??? metFORMIN (GLUCOPHAGE) 500  MG tablet TAKE 2 TABLETS(1000 MG) BY MOUTH DAILY WITH FOOD     ??? methylPREDNISolone (MEDROL DOSEPACK) 4 mg tablet As directed po     ??? moxifloxacin (AVELOX) 400 mg tablet Take 1 tablet (400 mg total) by mouth daily. 7 tablet 0   ??? mupirocin (BACTROBAN) 2 % ointment Apply topically.     ??? mupirocin (BACTROBAN) 2 % ointment Use as directed on nose sores 4 times daily. 30 g 0   ??? naltrexone (DEPADE) 50 mg tablet Split tablets into quarters.  Take one-quarter of a tablet once daily 30 tablet 11   ??? nortriptyline (PAMELOR) 75 MG capsule Take 75 mg by mouth.     ??? nystatin (MYCOSTATIN) 100,000 unit/mL suspension      ??? omeprazole (PRILOSEC) 20 MG capsule Take 20 mg by mouth.     ??? potassium chloride (KLOR-CON 10) 10 MEQ CR tablet TAKE 1 TABLET BY MOUTH DAILY     ??? predniSONE (DELTASONE) 10 MG tablet Take 1 tablet daily with food until next visit.     ??? predniSONE (DELTASONE) 10 MG tablet Take 2 tablets (20 mg total) by mouth daily with breakfast for 5 days. (Patient not taking: Reported on 07/16/2018) 10 tablet 0   ??? rivaroxaban (XARELTO) 20 mg tablet TAKE 1 TABLET BY MOUTH EVERY DAY WITH SUPPER     ??? suvorexant (BELSOMRA) 20 mg tablet Take 20 mg by mouth.     ??? SYMBICORT 160-4.5 mcg/actuation inhaler Inhale 2 puffs into the lungs TWICE DAILY 120/4=30  5   ??? topiramate (TOPAMAX) 50 MG tablet TAKE 1 TABLET BY MOUTH TWICE DAILY     ??? venlafaxine (EFFEXOR-XR) 75 MG 24 hr capsule Take 75 mg by mouth.       Current Facility-Administered Medications   Medication Dose Route Frequency Provider Last Rate Last Dose   ??? triamcinolone acetonide (KENALOG) injection 10 mg  10 mg Intradermal Once Elsie Stain, MD            Changes to medications: Reianna reports no changes at this time.    Allergies   Allergen Reactions   ??? Other Other (See Comments)     Patient has Sarcoidosis and can't tolerate any metals   ??? Suvorexant Other (See Comments)     Larey Seat out of bed while asleep: I'm waking up as I'm falling on the floor; Night terrors   ??? Enalapril Maleate      REACTION: cough   ??? Hydrochlorothiazide      REACTION: Low potassium   ??? Hydroxychloroquine Sulfate      REACTION: vision changes   ??? Iodine      Mother was intolerant--CODED on CT table---pt never tired.   ??? Latex      Hives   ??? Nickel Other (See Comments)     Pt has a titanium right and left knee - nickel causes skin irritations that forms into blisters and sores  Other reaction(s): Other (See Comments)  Pt has a titanium right knee - nickel causes skin irritations that forms into blisters and sores   ??? Adhesive Tape-Silicones Other (See Comments)     Medical tape causes bruising   ??? Doxycycline Diarrhea and Nausea And Vomiting   ??? Pregabalin Other (See Comments)     Made me feel high       Changes to allergies: No    SPECIALTY MEDICATION ADHERENCE     Humira CF 40/0.4 mg/ml: 7 days of medicine on hand  Specialty medication(s) dose(s) confirmed: Regimen is correct and unchanged.     Are there any concerns with adherence? No    Adherence counseling provided? Not needed    CLINICAL MANAGEMENT AND INTERVENTION      Clinical Benefit Assessment:    Do you feel the medicine is effective or helping your condition? Yes    Clinical Benefit counseling provided? Not needed    Adverse Effects Assessment:    Are you experiencing any side effects? No    Are you experiencing difficulty administering your medicine? No    Quality of Life Assessment:    How many days over the past month did your sarcoidosis  keep you from your normal activities? For example, brushing your teeth or getting up in the morning. 0    Have you discussed this with your provider? Not needed    Therapy Appropriateness:    Is therapy appropriate? Yes, therapy is appropriate and should be continued    DISEASE/MEDICATION-SPECIFIC INFORMATION      For patients on injectable medications: Patient currently has 1 doses left.  Next injection is scheduled for 09/23/18.    PATIENT SPECIFIC NEEDS     ? Does the patient have any physical, cognitive, or cultural barriers? No    ? Is the patient high risk? No     ? Does the patient require a Care Management Plan? No     ? Does the patient require physician intervention or other additional services (i.e. nutrition, smoking cessation, social work)? No      SHIPPING     Specialty Medication(s) to be Shipped:   Inflammatory Disorders: Humira    Other medication(s) to be shipped: n/a     Changes to insurance: No    Delivery Scheduled: Yes, Expected medication delivery date: 09/24/18.     Medication will be delivered via UPS to the confirmed home address in [facility].    The patient will receive a drug information handout for each medication shipped and additional FDA Medication Guides as required.  Verified that patient has previously received a Conservation officer, historic buildings.    All of the patient's questions and concerns have been addressed.    Dorella Laster Vangie Bicker   [facility] Specialty Pharmacist

## 2018-09-17 ENCOUNTER — Other Ambulatory Visit: Payer: Self-pay | Admitting: Internal Medicine

## 2018-09-23 MED FILL — HUMIRA PEN CITRATE FREE 40 MG/0.4 ML: 28 days supply | Qty: 4 | Fill #6

## 2018-09-23 MED FILL — HUMIRA PEN CITRATE FREE 40 MG/0.4 ML: 28 days supply | Qty: 4 | Fill #6 | Status: AC

## 2018-10-01 ENCOUNTER — Telehealth: Payer: Self-pay | Admitting: Internal Medicine

## 2018-10-01 NOTE — Telephone Encounter (Signed)
I do not see patient is due, please advise about pneumonia vaccine

## 2018-10-01 NOTE — Telephone Encounter (Signed)
Please advise if patient is due for Pneumonia shot.

## 2018-10-01 NOTE — Telephone Encounter (Signed)
Patient is calling to ask is it time for her [place] shot Please advise.

## 2018-10-02 NOTE — Telephone Encounter (Signed)
Pt.notified

## 2018-10-02 NOTE — Telephone Encounter (Signed)
She is up-to-date.  We can repeat Prevnar this or next year.  Thanks

## 2018-10-05 ENCOUNTER — Other Ambulatory Visit: Payer: Self-pay | Admitting: Internal Medicine

## 2018-10-07 ENCOUNTER — Ambulatory Visit (INDEPENDENT_AMBULATORY_CARE_PROVIDER_SITE_OTHER): Payer: Medicare Other

## 2018-10-07 ENCOUNTER — Other Ambulatory Visit: Payer: Self-pay

## 2018-10-07 DIAGNOSIS — E538 Deficiency of other specified B group vitamins: Secondary | ICD-10-CM | POA: Diagnosis not present

## 2018-10-07 DIAGNOSIS — Z23 Encounter for immunization: Secondary | ICD-10-CM | POA: Diagnosis not present

## 2018-10-07 MED ORDER — CYANOCOBALAMIN 1000 MCG/ML IJ SOLN
1000.0000 ug | Freq: Once | INTRAMUSCULAR | Status: AC
  Administered 2018-10-07: 13:00:00 1000 ug via INTRAMUSCULAR

## 2018-10-15 ENCOUNTER — Other Ambulatory Visit: Payer: Self-pay | Admitting: Internal Medicine

## 2018-10-16 MED ORDER — EMPTY CONTAINER
2 refills | 0 days
Start: 2018-10-16 — End: ?

## 2018-10-16 NOTE — Unmapped (Signed)
[facility] Refill Coordination Note    Specialty Medication(s) to be Shipped:   Inflammatory Disorders: Humira    Other medication(s) to be shipped: sharps container, on call test strips and on call lancets     Carolyn Newton, DOB: Aug 08, 1960  Phone: [phone] (home)       All above HIPAA information was verified with patient.     Completed refill call assessment today to schedule patient's medication shipment from the [facility] [phone]).       Specialty medication(s) and dose(s) confirmed: Regimen is correct and unchanged.   Changes to medications: Mayleen reports no changes at this time.  Changes to insurance: No  Questions for the pharmacist: No    Confirmed patient received Welcome Packet with first shipment. The patient will receive a drug information handout for each medication shipped and additional FDA Medication Guides as required.       DISEASE/MEDICATION-SPECIFIC INFORMATION        For patients on injectable medications: Patient currently has 2 doses left.  Next injection is scheduled for 100720.    SPECIALTY MEDICATION ADHERENCE     Medication Adherence    Patient reported X missed doses in the last month: 0  Specialty Medication: humira cf 40 mg/0.4 ml   Patient is on additional specialty medications: No  Any gaps in refill history greater than 2 weeks in the last 3 months: no  Demonstrates understanding of importance of adherence: yes  Informant: patient  Reliability of informant: reliable  Confirmed plan for next specialty medication refill: delivery by pharmacy  Refills needed for supportive medications: not needed                humira cf 40 mg/0.4 ml . 14 days on hand      SHIPPING     Shipping address confirmed in Epic.     Delivery Scheduled: Yes, Expected medication delivery date: 100720.     Medication will be delivered via UPS to the home address in Epic [facility].    Miraj Truss D Lily Kernen   [facility] Specialty Technician

## 2018-10-17 NOTE — Progress Notes (Signed)
Medical screening examination/treatment/procedure(s) were performed by non-physician practitioner and as supervising physician I was immediately available for consultation/collaboration. I agree with above. Kalei Mckillop, MD  

## 2018-10-20 MED FILL — ON CALL EXPRESS TEST STRIP: 100 days supply | Qty: 100 | Fill #1 | Status: AC

## 2018-10-20 MED FILL — EMPTY CONTAINER: 60 days supply | Qty: 1 | Fill #0

## 2018-10-20 MED FILL — ON CALL EXPRESS TEST STRIP: 100 days supply | Qty: 100 | Fill #1

## 2018-10-20 MED FILL — ON CALL LANCET 30 GAUGE: 100 days supply | Qty: 100 | Fill #1

## 2018-10-20 MED FILL — ON CALL LANCET 30 GAUGE: 100 days supply | Qty: 100 | Fill #1 | Status: AC

## 2018-10-20 MED FILL — HUMIRA PEN CITRATE FREE 40 MG/0.4 ML: 28 days supply | Qty: 4 | Fill #7

## 2018-10-20 MED FILL — HUMIRA PEN CITRATE FREE 40 MG/0.4 ML: 28 days supply | Qty: 4 | Fill #7 | Status: AC

## 2018-10-20 MED FILL — EMPTY CONTAINER: 60 days supply | Qty: 1 | Fill #0 | Status: AC

## 2018-10-22 ENCOUNTER — Ambulatory Visit (INDEPENDENT_AMBULATORY_CARE_PROVIDER_SITE_OTHER): Payer: Medicare Other | Admitting: Neurology

## 2018-10-22 ENCOUNTER — Encounter: Payer: Self-pay | Admitting: Neurology

## 2018-10-22 VITALS — BP 150/76 | HR 75 | Temp 97.3°F | Ht 67.0 in | Wt 291.0 lb

## 2018-10-22 DIAGNOSIS — R519 Headache, unspecified: Secondary | ICD-10-CM

## 2018-10-22 DIAGNOSIS — G441 Vascular headache, not elsewhere classified: Secondary | ICD-10-CM

## 2018-10-22 DIAGNOSIS — H539 Unspecified visual disturbance: Secondary | ICD-10-CM

## 2018-10-22 DIAGNOSIS — R51 Headache with orthostatic component, not elsewhere classified: Secondary | ICD-10-CM | POA: Diagnosis not present

## 2018-10-22 DIAGNOSIS — G43709 Chronic migraine without aura, not intractable, without status migrainosus: Secondary | ICD-10-CM

## 2018-10-22 NOTE — Patient Instructions (Addendum)
Start Ajovy  Fremanezumab injection What is this medicine? FREMANEZUMAB (fre ma NEZ ue mab) is used to prevent migraine headaches. This medicine may be used for other purposes; ask your health care provider or pharmacist if you have questions. COMMON BRAND NAME(S): AJOVY What should I tell my health care provider before I take this medicine? They need to know if you have any of these conditions:  an unusual or allergic reaction to fremanezumab, other medicines, foods, dyes, or preservatives  pregnant or trying to get pregnant  breast-feeding How should I use this medicine? This medicine is for injection under the skin. You will be taught how to prepare and give this medicine. Use exactly as directed. Take your medicine at regular intervals. Do not take your medicine more often than directed. It is important that you put your used needles and syringes in a special sharps container. Do not put them in a trash can. If you do not have a sharps container, call your pharmacist or healthcare provider to get one. Talk to your pediatrician regarding the use of this medicine in children. Special care may be needed. Overdosage: If you think you have taken too much of this medicine contact a poison control center or emergency room at once. NOTE: This medicine is only for you. Do not share this medicine with others. What if I miss a dose? If you miss a dose, take it as soon as you can. If it is almost time for your next dose, take only that dose. Do not take double or extra doses. What may interact with this medicine? Interactions are not expected. This list may not describe all possible interactions. Give your health care provider a list of all the medicines, herbs, non-prescription drugs, or dietary supplements you use. Also tell them if you smoke, drink alcohol, or use illegal drugs. Some items may interact with your medicine. What should I watch for while using this medicine? Tell your doctor or  healthcare professional if your symptoms do not start to get better or if they get worse. What side effects may I notice from receiving this medicine? Side effects that you should report to your doctor or health care professional as soon as possible:  allergic reactions like skin rash, itching or hives, swelling of the face, lips, or tongue Side effects that usually do not require medical attention (report these to your doctor or health care professional if they continue or are bothersome):  pain, redness, or irritation at site where injected This list may not describe all possible side effects. Call your doctor for medical advice about side effects. You may report side effects to FDA at 1-800-FDA-1088. Where should I keep my medicine? Keep out of the reach of children. You will be instructed on how to store this medicine. Throw away any unused medicine after the expiration date on the label. NOTE: This sheet is a summary. It may not cover all possible information. If you have questions about this medicine, talk to your doctor, pharmacist, or health care provider.  2020 Elsevier/Gold Standard (2016-09-30 17:22:56)

## 2018-10-22 NOTE — Progress Notes (Signed)
[facility]    Provider:  Dr Jaynee Eagles Requesting Provider:  Warden Fillers Primary Care Provider:  Cassandria Anger, MD  CC:  Neuralgia   HPI:  [name] is a 58 y.o. female here as requested by Plotnikov, Evie Lacks, MD for trigeminal neuralgia. She had eye surgery on the right. She has pain on the left, she wakes up and it is blood red, she has pain that radiates back across the scalp. She is in pain. This started 8 months ago. No inciting events as far as she knows. She had to sleep on her side for several months due to a bag on her left side.  She used to have migraines. She can wake up in the morning with headaches and she can last all day. Throbbing. Light sensitivity. Sitting in a dark room helps. Positional - worse supine. +visual changes. Smells bother it. Both eyes constantly water. No other focal neurologic deficits, associated symptoms, inciting events or modifiable factors.  Reviewed notes, labs and imaging from outside physicians, which showed:  Personally reviewed MRI of the brain images an dagree with the following:   IMPRESSION: Premature atrophy.  Remote RIGHT basal ganglia infarct.  Subcortical and periventricular white matter signal abnormality consistent with chronic microvascular ischemic change.  CMP unremarkable 08/2018  Review of Systems: Patient complains of symptoms per HPI as well as the following symptoms: headache. Pertinent negatives and positives per HPI. All others negative.   Social History   Socioeconomic History  . Marital status: Single    Spouse name: Not on file  . Number of children: 1  . Years of education: Not on file  . Highest education level: Not on file  Occupational History  . Occupation: disabled  Social Needs  . Financial resource strain: Somewhat hard  . Food insecurity    Worry: Sometimes true    Inability: Sometimes true  . Transportation needs    Medical: No    Non-medical: Yes   Tobacco Use  . Smoking status: Former Smoker    Packs/day: 0.50    Years: 10.00    Pack years: 5.00    Types: Cigarettes  . Smokeless tobacco: Never Used  . Tobacco comment: quit smoking in 2004  Substance and Sexual Activity  . Alcohol use: Not Currently    Alcohol/week: 1.0 standard drinks    Types: 1 Glasses of wine per week    Comment: occassionaly  . Drug use: No  . Sexual activity: Not Currently  Lifestyle  . Physical activity    Days per week: 0 days    Minutes per session: 0 min  . Stress: To some extent  Relationships  . Social connections    Talks on phone: More than three times a week    Gets together: Once a week    Attends religious service: 1 to 4 times per year    Active member of club or organization: No    Attends meetings of clubs or organizations: Never    Relationship status: Not on file  . Intimate partner violence    Fear of current or ex partner: Not on file    Emotionally abused: Not on file    Physically abused: Not on file    Forced sexual activity: Not on file  Other Topics Concern  . Not on file  Social History Narrative   Single, broke up with partner in 2008.      Lives at home alone   Caffeine: stopped 2009  Family History  Problem Relation Age of Onset  . Heart disease Mother   . Kidney disease Mother   . Cancer Father        leukemia  . Hypertension Other   . Coronary artery disease Other        female 1st degree relative <60  . Heart failure Other        congestive  . Coronary artery disease Other        Female 1st degree relative <50  . Breast cancer Other        1st degree relative <50 S  . Breast cancer Sister   . Heart Problems Brother   . Colon cancer Brother     Past Medical History:  Diagnosis Date  . Allergic rhinitis   . ALLERGIC RHINITIS 10/26/2009  . Anxiety   . ANXIETY 08/14/2006  . B12 DEFICIENCY 08/25/2007  . Complication of anesthesia    pt has had difficulty following anesthesia with her knee in  2016-unable to care for herself afterward  . Confusion   . Depression    takes Lexapro daily  . Diabetes mellitus without complication ([place])    was on insulin but has been off since Nov 2015 and now only takes Metformin daily  . DYSPNEA 04/28/2009   with exertion  . Esophageal reflux    takes Nexium daily  . Fibromyalgia   . Headache    last migraine 2-55yr ago;takes Topamax daily  . History of shingles   . Hypertension    takes Coreg daily  . Insomnia    takes Nortriptyline nightly   . Joint pain   . Joint swelling   . Left knee pain   . Lichen planus   . Long-term memory loss   . Nocturia   . OSA (obstructive sleep apnea)    doesn't use CPAP;sleep study in epic from 2006  . Osteoarthritis   . Osteoarthritis   . Pneumonia    over 30 yrs ago  . Protein calorie malnutrition ([facility]   . Rheumatoid arthritis ([facility]   . Sarcoidosis    Dr. ZLolita Patella . Short-term memory loss   . Shortness of breath   . TIA (transient ischemic attack)   . Unsteady gait   . Urinary urgency   . Vitamin D deficiency    is supposed to take Vit D but can't afford it  . VITAMIN D DEFICIENCY 08/25/2007    Patient Active Problem List   Diagnosis Date Noted  . Vaginal candidiasis 07/15/2018  . Chronic cholecystitis with calculus 02/11/2018  . Thrush, oral 01/15/2018  . Nasal sinus congestion 01/15/2018  . Dyslipidemia 10/28/2017  . Pruritus 10/23/2017  . Abnormal liver function test   . Anemia 09/24/2017  . Migraines 09/17/2017  . Cholecystitis, acute 09/12/2017  . COPD (chronic obstructive pulmonary disease) ([facility] 12/30/2016  . Diastolic dysfunction 182/95/6213 . OSA (obstructive sleep apnea) 09/30/2016  . Chronic respiratory failure with hypoxia ([facility] 08/23/2016  . Pulmonary embolism ([facility] 07/21/2016  . DVT (deep venous thrombosis) ([facility] 07/21/2016  . Weight gain 12/20/2015  . Controlled type 2 diabetes mellitus with chronic kidney disease, without long-term current use of insulin ([facility]  02/01/2015  . Abnormal SPEP 06/04/2013  . Memory loss 04/27/2013  . Panic attacks 03/17/2013  . Rash 10/27/2012  . Pulmonary nodules 07/20/2012  . CAD in native artery 05/28/2012  . Seborrheic dermatitis 01/20/2012  . Obesity, Class III, BMI 40-49.9 (morbid obesity) ([facility] 03/07/2010  . INSOMNIA, CHRONIC 03/07/2010  .  Allergic rhinitis 10/26/2009  . Essential hypertension 08/27/2007  . B12 deficiency 08/25/2007  . Vitamin D deficiency 08/25/2007  . SINUSITIS, ACUTE 02/02/2007  . Osteoarthritis 02/02/2007  . Sarcoidosis 08/14/2006  . Anxiety state 08/14/2006  . Major depressive disorder, recurrent episode ([place]) 08/14/2006  . GERD 08/14/2006    Past Surgical History:  Procedure Laterality Date  . APPENDECTOMY    . arthroscopic knee surgery Right 11-12-04  . AXILLARY ABCESS IRRIGATION AND DEBRIDEMENT  Jul & Aug2012  . CARPAL TUNNEL RELEASE Left 05/23/2014   Procedure: CARPAL TUNNEL RELEASE;  Surgeon: Meredith Pel, MD;  Location: [place];  Service: Orthopedics;  Laterality: Left;  . CHOLECYSTECTOMY  02/11/2018   LAPROSCOPIC  . CHOLECYSTECTOMY N/A 02/11/2018   Procedure: LAPAROSCOPIC CHOLECYSTECTOMY WITH INTRAOPERATIVE CHOLANGIOGRAM ERAS PATHWAY;  Surgeon: Donnie Mesa, MD;  Location: [place];  Service: General;  Laterality: N/A;  . cyst removed from top of buttocks  at age 64  . ENDOMETRIAL ABLATION    . IR CHOLANGIOGRAM EXISTING TUBE  11/21/2017  . IR CHOLANGIOGRAM EXISTING TUBE  01/16/2018  . IR EXCHANGE BILIARY DRAIN  11/10/2017  . IR PATIENT EVAL TECH 0-60 MINS  02/03/2018  . IR PERC CHOLECYSTOSTOMY  09/13/2017  . IR RADIOLOGIST EVAL & MGMT  10/08/2017  . LACRIMAL DUCT EXPLORATION Right 06/26/2017   Procedure: LACRIMAL DUCT EXPLORATION AND ETHMOIDECTOMY;  Surgeon: Clista Bernhardt, MD;  Location: [place];  Service: Ophthalmology;  Laterality: Right;  . TEAR DUCT PROBING Right 06/26/2017   Procedure: TEAR DUCT PROBING WITH STENT;  Surgeon: Clista Bernhardt, MD;  Location: [place];   Service: Ophthalmology;  Laterality: Right;  . TOTAL KNEE ARTHROPLASTY Right 11/15/2014   Procedure: TOTAL RIGHT KNEE ARTHROPLASTY;  Surgeon: Meredith Pel, MD;  Location: [place];  Service: Orthopedics;  Laterality: Right;  . TOTAL KNEE ARTHROPLASTY Left 07/13/2015   Procedure: LEFT TOTAL KNEE ARTHROPLASTY;  Surgeon: Meredith Pel, MD;  Location: [place];  Service: Orthopedics;  Laterality: Left;    Current Outpatient Medications  Medication Sig Dispense Refill  . Adalimumab (HUMIRA PEN-PS/UV/ADOL HS START) 40 MG/0.8ML PNKT Inject 40 mg into the skin once a week.    [name] augmented betamethasone dipropionate (DIPROLENE-AF) 0.05 % ointment Apply topically 2 (two) times daily. 45 g 2  . carvedilol (COREG) 12.5 MG tablet Take 1 tablet (12.5 mg total) by mouth 2 (two) times daily with a meal. 180 tablet 3  . clonazePAM (KLONOPIN) 0.5 MG tablet Take 1 TO 2 tablets (0.5-1 mg total) by mouth at bedtime as needed for anxiety. 60 tablet 3  . dexlansoprazole (DEXILANT) 60 MG capsule Take 1 capsule (60 mg total) by mouth daily. 90 capsule 3  . gabapentin (NEURONTIN) 300 MG capsule Take 1 capsule (300 mg total) by mouth 2 (two) times daily as needed (for pain). (Patient taking differently: Take 300 mg by mouth 2 (two) times daily. ) 180 capsule 1  . metFORMIN (GLUCOPHAGE-XR) 500 MG 24 hr tablet TAKE 2 TABLETS(1000 MG) BY MOUTH DAILY WITH BREAKFAST 180 tablet 2  . mupirocin ointment (BACTROBAN) 2 % Use qid on nose sores 30 g 0  . potassium chloride (K-DUR) 10 MEQ tablet Take 1 tablet (10 mEq total) by mouth daily. 90 tablet 3  . topiramate (TOPAMAX) 50 MG tablet Take 1 tablet (50 mg total) by mouth 2 (two) times daily. 180 tablet 3  . triamcinolone cream (KENALOG) 0.5 % Apply 1 application topically 3 (three) times daily. 30 g 0  . venlafaxine XR (  EFFEXOR-XR) 75 MG 24 hr capsule Take 1 capsule (75 mg total) by mouth daily with breakfast. 30 capsule 11  . VOLTAREN 1 % GEL Apply 2 g topically 2 (two) times  daily as needed (for pain). 100/4=25 (Patient taking differently: Apply 4 g topically 4 (four) times daily as needed (pain). ) 100 g 2  . XARELTO 20 MG TABS tablet Take 1 tablet (20 mg total) by mouth at bedtime. 90 tablet 3  . B-D ULTRA-FINE 33 LANCETS MISC Use as instructed to test sugars once  daily 100 each 12  . Blood Glucose Monitoring Suppl (ONETOUCH VERIO) w/Device KIT Use to check blood sugar once a day 1 kit 0  . budesonide-formoterol (SYMBICORT) 160-4.5 MCG/ACT inhaler Inhale 2 puffs into the lungs TWICE DAILY 120/4=30 10.2 g 0  . Fremanezumab-vfrm (AJOVY) 225 MG/1.5ML SOAJ Inject 225 mg into the skin every 30 (thirty) days. 1 pen 11  . furosemide (LASIX) 40 MG tablet TAKE 1 TABLET BY MOUTH EVERY MORNING FOR EDEMA (Patient taking differently: Take 40 mg by mouth daily as needed for fluid or edema. ) 90 tablet 1  . glucose blood (ONETOUCH VERIO) test strip Use as instructed to check blood sugar once a day. 100 each 12  . hydrOXYzine (ATARAX/VISTARIL) 25 MG tablet Take 1 tablet (25 mg total) by mouth every 8 (eight) hours as needed for itching. 60 tablet 1  . VENTOLIN HFA 108 (90 Base) MCG/ACT inhaler Inhale 2 puffs into the lungs every 6 (six) hours as needed for wheezing or shortness of breath. 200/8=25 (Patient taking differently: Inhale 2 puffs into the lungs every 6 (six) hours as needed for wheezing or shortness of breath. ) 18 g 11   No current facility-administered medications for this visit.     Allergies as of 10/22/2018 - Review Complete 10/22/2018  Allergen Reaction Noted  . Belsomra [suvorexant] Other (See Comments) 01/21/2017  . Enalapril maleate Cough 02/02/2007  . Latex Hives 05/11/2012  . Nickel Other (See Comments) 07/03/2015  . Other Other (See Comments) 05/23/2017  . Morphine and related Itching and Nausea Only 02/11/2018  . Doxycycline Diarrhea and Nausea And Vomiting 05/23/2017  . Hydrochlorothiazide Other (See Comments) 02/02/2007  . Hydroxychloroquine sulfate  Other (See Comments) 07/10/2007  . Lyrica [pregabalin] Other (See Comments) 05/23/2017  . Tape Other (See Comments) 05/23/2017    Vitals: BP (!) 150/76 (BP Location: Right Arm, Patient Position: Sitting, Cuff Size: Large)   Pulse 75   Temp (!) 97.3 F (36.3 C) Comment: taken at front door  Ht 5' 7" (1.702 m)   Wt 291 lb (132 kg)   LMP 05/19/2003   BMI 45.58 kg/m  Last Weight:  Wt Readings from Last 1 Encounters:  10/22/18 291 lb (132 kg)   Last Height:   Ht Readings from Last 1 Encounters:  10/22/18 5' 7" (1.702 m)     Physical exam: Exam: Gen: NAD, conversant, well nourised, obese, well groomed                     CV: RRR, no MRG. No Carotid Bruits. No peripheral edema, warm, nontender Eyes: Conjunctivae clear without exudates or hemorrhage  Neuro: Detailed Neurologic Exam  Speech:    Speech is normal; fluent and spontaneous with normal comprehension.  Cognition:    The patient is oriented to person, place, and time;     recent and remote memory intact;     language fluent;     normal attention, concentration,  fund of knowledge Cranial Nerves:    The pupils are equal, round, and reactive to light. The fundi are normal and spontaneous venous pulsations are present. Visual fields are full to finger confrontation. Extraocular movements are intact. Trigeminal sensation is intact and the muscles of mastication are normal. The face is symmetric. The palate elevates in the midline. Hearing intact. Voice is normal. Shoulder shrug is normal. The tongue has normal motion without fasciculations.   Coordination:    Normal dysmetria  Gait:    Not ataxic  Motor Observation:    No asymmetry, no atrophy, and no involuntary movements noted. Tone:    Normal muscle tone.    Posture:    Posture is normal. normal erect    Strength:    Strength is equal and symmetric in the upper and lower limbs.      Sensation: intact to LT     Reflex Exam:  DTR's:    Deep tendon  reflexes in the upper and lower extremities are symmetrical bilaterally.   Toes:    The toes are downgoing bilaterally.   Clonus:    Clonus is absent.    Assessment/Plan:  58 year old with new onset headache likely chronic migraines. But given concerning symptoms and new onset after the age of 54 needs MRI brain.  MRI brain due to concerning symptoms of morning headaches, positional headaches,vision changes  to look for space occupying mass, strokes, vascular causes, chiari or intracranial hypertension (pseudotumor) or other.  Given polypharmacy, hesitate to add another med or increase Topamax. Try Ajovy  Orders Placed This Encounter  Procedures  . MR BRAIN W WO CONTRAST   Meds ordered this encounter  Medications  . Fremanezumab-vfrm (AJOVY) 225 MG/1.5ML SOAJ    Sig: Inject 225 mg into the skin every 30 (thirty) days.    Dispense:  1 pen    Refill:  11    Cc: Plotnikov, Evie Lacks, MD  Sarina Ill, MD  [facility] [address] [place], [place] [address]  Phone [phone] Fax [phone]

## 2018-10-24 ENCOUNTER — Other Ambulatory Visit: Payer: Self-pay | Admitting: Internal Medicine

## 2018-10-25 ENCOUNTER — Encounter: Payer: Self-pay | Admitting: Neurology

## 2018-10-25 MED ORDER — AJOVY 225 MG/1.5ML ~~LOC~~ SOAJ: 225.0000 mg | SUBCUTANEOUS | 11 refills | Status: DC

## 2018-10-26 ENCOUNTER — Ambulatory Visit (INDEPENDENT_AMBULATORY_CARE_PROVIDER_SITE_OTHER): Payer: Medicare Other | Admitting: Internal Medicine

## 2018-10-26 ENCOUNTER — Encounter: Payer: Self-pay | Admitting: Internal Medicine

## 2018-10-26 ENCOUNTER — Telehealth: Payer: Self-pay | Admitting: Neurology

## 2018-10-26 ENCOUNTER — Other Ambulatory Visit: Payer: Self-pay

## 2018-10-26 VITALS — BP 142/78 | HR 68 | Temp 98.2°F | Ht 67.0 in | Wt 291.0 lb

## 2018-10-26 DIAGNOSIS — G43109 Migraine with aura, not intractable, without status migrainosus: Secondary | ICD-10-CM | POA: Diagnosis not present

## 2018-10-26 DIAGNOSIS — N182 Chronic kidney disease, stage 2 (mild): Secondary | ICD-10-CM | POA: Diagnosis not present

## 2018-10-26 DIAGNOSIS — E1122 Type 2 diabetes mellitus with diabetic chronic kidney disease: Secondary | ICD-10-CM | POA: Diagnosis not present

## 2018-10-26 DIAGNOSIS — E559 Vitamin D deficiency, unspecified: Secondary | ICD-10-CM | POA: Diagnosis not present

## 2018-10-26 DIAGNOSIS — Z800 Family history of malignant neoplasm of digestive organs: Secondary | ICD-10-CM | POA: Diagnosis not present

## 2018-10-26 DIAGNOSIS — Z1211 Encounter for screening for malignant neoplasm of colon: Secondary | ICD-10-CM | POA: Diagnosis not present

## 2018-10-26 DIAGNOSIS — E538 Deficiency of other specified B group vitamins: Secondary | ICD-10-CM | POA: Diagnosis not present

## 2018-10-26 DIAGNOSIS — I10 Essential (primary) hypertension: Secondary | ICD-10-CM

## 2018-10-26 MED ORDER — SHINGRIX 50 MCG/0.5ML IM SUSR: 0.5000 mL | Freq: Once | INTRAMUSCULAR | 1 refills | Status: AC

## 2018-10-26 NOTE — Assessment & Plan Note (Signed)
brother w/colon ca GI ref

## 2018-10-26 NOTE — Assessment & Plan Note (Signed)
On B12 

## 2018-10-26 NOTE — Progress Notes (Signed)
Subjective:  Patient ID: [name], female    DOB: 1960/11/17  Age: 58 y.o. MRN: [id number]  CC: No chief complaint on file.   HPI [name] presents for itching, sarcoid, LBP f/u F/u B12 def  Outpatient Medications Prior to Visit  Medication Sig Dispense Refill  . Adalimumab (HUMIRA PEN-PS/UV/ADOL HS START) 40 MG/0.8ML PNKT Inject 40 mg into the skin once a week.    [name] augmented betamethasone dipropionate (DIPROLENE-AF) 0.05 % ointment Apply topically 2 (two) times daily. 45 g 2  . B-D ULTRA-FINE 33 LANCETS MISC Use as instructed to test sugars once  daily 100 each 12  . Blood Glucose Monitoring Suppl (ONETOUCH VERIO) w/Device KIT Use to check blood sugar once a day 1 kit 0  . budesonide-formoterol (SYMBICORT) 160-4.5 MCG/ACT inhaler Inhale 2 puffs into the lungs TWICE DAILY 120/4=30 10.2 g 0  . carvedilol (COREG) 12.5 MG tablet Take 1 tablet (12.5 mg total) by mouth 2 (two) times daily with a meal. 180 tablet 3  . clonazePAM (KLONOPIN) 0.5 MG tablet Take 1 TO 2 tablets (0.5-1 mg total) by mouth at bedtime as needed for anxiety. 60 tablet 3  . dexlansoprazole (DEXILANT) 60 MG capsule Take 1 capsule (60 mg total) by mouth daily. 90 capsule 3  . Fremanezumab-vfrm (AJOVY) 225 MG/1.5ML SOAJ Inject 225 mg into the skin every 30 (thirty) days. 1 pen 11  . furosemide (LASIX) 40 MG tablet TAKE 1 TABLET BY MOUTH EVERY MORNING FOR EDEMA (Patient taking differently: Take 40 mg by mouth daily as needed for fluid or edema. ) 90 tablet 1  . gabapentin (NEURONTIN) 300 MG capsule Take 1 capsule (300 mg total) by mouth 2 (two) times daily as needed (for pain). (Patient taking differently: Take 300 mg by mouth 2 (two) times daily. ) 180 capsule 1  . glucose blood (ONETOUCH VERIO) test strip Use as instructed to check blood sugar once a day. 100 each 12  . hydrOXYzine (ATARAX/VISTARIL) 25 MG tablet Take 1 tablet (25 mg total) by mouth every 8 (eight) hours as needed for  itching. 60 tablet 1  . metFORMIN (GLUCOPHAGE-XR) 500 MG 24 hr tablet TAKE 2 TABLETS(1000 MG) BY MOUTH DAILY WITH BREAKFAST 180 tablet 2  . mupirocin ointment (BACTROBAN) 2 % Use qid on nose sores 30 g 0  . potassium chloride (K-DUR) 10 MEQ tablet Take 1 tablet (10 mEq total) by mouth daily. 90 tablet 3  . topiramate (TOPAMAX) 50 MG tablet Take 1 tablet (50 mg total) by mouth 2 (two) times daily. 180 tablet 3  . triamcinolone cream (KENALOG) 0.5 % Apply 1 application topically 3 (three) times daily. 30 g 0  . venlafaxine XR (EFFEXOR-XR) 75 MG 24 hr capsule Take 1 capsule (75 mg total) by mouth daily with breakfast. 30 capsule 11  . VENTOLIN HFA 108 (90 Base) MCG/ACT inhaler Inhale 2 puffs into the lungs every 6 (six) hours as needed for wheezing or shortness of breath. 200/8=25 (Patient taking differently: Inhale 2 puffs into the lungs every 6 (six) hours as needed for wheezing or shortness of breath. ) 18 g 11  . VOLTAREN 1 % GEL Apply 2 g topically 2 (two) times daily as needed (for pain). 100/4=25 (Patient taking differently: Apply 4 g topically 4 (four) times daily as needed (pain). ) 100 g 2  . XARELTO 20 MG TABS tablet Take 1 tablet (20 mg total) by mouth at bedtime. 90 tablet 3   No facility-administered medications prior to  visit.     ROS: Review of Systems  Constitutional: Positive for fatigue and unexpected weight change. Negative for activity change, appetite change, chills, diaphoresis and fever.  HENT: Negative for congestion, dental problem, ear pain, hearing loss, mouth sores, postnasal drip, sinus pressure, sneezing, sore throat and voice change.   Eyes: Negative for pain and visual disturbance.  Respiratory: Positive for shortness of breath. Negative for cough, chest tightness, wheezing and stridor.   Cardiovascular: Negative for chest pain, palpitations and leg swelling.  Gastrointestinal: Negative for abdominal distention, abdominal pain, blood in stool, nausea, rectal pain  and vomiting.  Genitourinary: Negative for decreased urine volume, difficulty urinating, dysuria, frequency, hematuria, menstrual problem, vaginal bleeding, vaginal discharge and vaginal pain.  Musculoskeletal: Positive for arthralgias, back pain and gait problem. Negative for joint swelling and neck pain.  Skin: Positive for rash. Negative for color change and wound.  Neurological: Negative for dizziness, tremors, syncope, speech difficulty, weakness and light-headedness.  Hematological: Negative for adenopathy.  Psychiatric/Behavioral: Positive for decreased concentration. Negative for behavioral problems, confusion, dysphoric mood, hallucinations, sleep disturbance and suicidal ideas. The patient is nervous/anxious. The patient is not hyperactive.     Objective:  BP (!) 142/78 (BP Location: Right Arm, Patient Position: Sitting, Cuff Size: Large)   Pulse 68   Temp 98.2 F (36.8 C) (Axillary)   Ht _0  (1.702 m)   Wt 291 lb (132 kg)   LMP 05/19/2003   SpO2 97%   BMI 45.58 kg/m   BP Readings from Last 3 Encounters:  10/26/18 (!) 142/78  10/22/18 (!) 150/76  08/27/18 124/80    Wt Readings from Last 3 Encounters:  10/26/18 291 lb (132 kg)  10/22/18 291 lb (132 kg)  08/27/18 285 lb (129.3 kg)    Physical Exam Constitutional:      General: She is not in acute distress.    Appearance: She is well-developed.  HENT:     Head: Normocephalic.     Right Ear: External ear normal.     Left Ear: External ear normal.     Nose: Nose normal.  Eyes:     General:        Right eye: No discharge.        Left eye: No discharge.     Conjunctiva/sclera: Conjunctivae normal.     Pupils: Pupils are equal, round, and reactive to light.  Neck:     Musculoskeletal: Normal range of motion and neck supple.     Thyroid: No thyromegaly.     Vascular: No JVD.     Trachea: No tracheal deviation.  Cardiovascular:     Rate and Rhythm: Normal rate and regular rhythm.     Heart sounds: Normal heart  sounds.  Pulmonary:     Effort: No respiratory distress.     Breath sounds: No stridor. No wheezing.  Abdominal:     General: Bowel sounds are normal. There is no distension.     Palpations: Abdomen is soft. There is no mass.     Tenderness: There is no abdominal tenderness. There is no guarding or rebound.  Musculoskeletal:        General: Tenderness present.  Lymphadenopathy:     Cervical: No cervical adenopathy.  Skin:    Findings: Rash present. No erythema.  Neurological:     Mental Status: She is oriented to person, place, and time.     Cranial Nerves: No cranial nerve deficit.     Motor: No abnormal muscle tone.  Coordination: Coordination abnormal.     Deep Tendon Reflexes: Reflexes normal.  Psychiatric:        Behavior: Behavior normal.        Thought Content: Thought content normal.        Judgment: Judgment normal.    Walker LS w/pain  Lab Results  Component Value Date   WBC 6.2 04/07/2018   HGB 11.5 (L) 04/07/2018   HCT 35.3 (L) 04/07/2018   PLT 234.0 04/07/2018   GLUCOSE 87 08/27/2018   CHOL 220 (H) 08/27/2018   TRIG 107 08/27/2018   HDL 88 08/27/2018   LDLDIRECT 107.0 06/30/2017   LDLCALC 111 (H) 08/27/2018   ALT 23 08/27/2018   AST 19 08/27/2018   NA 143 08/27/2018   K 4.4 08/27/2018   CL 108 (H) 08/27/2018   CREATININE 0.90 08/27/2018   BUN 18 08/27/2018   CO2 23 08/27/2018   TSH 2.31 08/23/2016   INR 1.00 02/11/2018   HGBA1C 6.4 04/07/2018   MICROALBUR <0.7 02/01/2015    No results found.  Assessment & Plan:   There are no diagnoses linked to this encounter.   No orders of the defined types were placed in this encounter.    Follow-up: No follow-ups on file.  Walker Kehr, MD

## 2018-10-26 NOTE — Assessment & Plan Note (Signed)
Vit D 

## 2018-10-26 NOTE — Assessment & Plan Note (Signed)
NAS diet Coreg Lasix

## 2018-10-26 NOTE — Assessment & Plan Note (Signed)
Metformin, Januvia 

## 2018-10-26 NOTE — Telephone Encounter (Signed)
Medicare/medicaid order sent to [facility]. No auth they will reach out to the patient to schedule.  °

## 2018-10-26 NOTE — Assessment & Plan Note (Signed)
Wt Readings from Last 3 Encounters:  10/26/18 291 lb (132 kg)  10/22/18 291 lb (132 kg)  08/27/18 285 lb (129.3 kg)

## 2018-10-27 ENCOUNTER — Other Ambulatory Visit: Payer: Self-pay

## 2018-10-27 ENCOUNTER — Ambulatory Visit (INDEPENDENT_AMBULATORY_CARE_PROVIDER_SITE_OTHER): Payer: Medicare Other | Admitting: Adult Health

## 2018-10-27 ENCOUNTER — Encounter: Payer: Self-pay | Admitting: Adult Health

## 2018-10-27 ENCOUNTER — Telehealth: Payer: Self-pay

## 2018-10-27 DIAGNOSIS — D869 Sarcoidosis, unspecified: Secondary | ICD-10-CM

## 2018-10-27 DIAGNOSIS — J9611 Chronic respiratory failure with hypoxia: Secondary | ICD-10-CM | POA: Diagnosis not present

## 2018-10-27 DIAGNOSIS — I2782 Chronic pulmonary embolism: Secondary | ICD-10-CM | POA: Diagnosis not present

## 2018-10-27 MED ORDER — CYANOCOBALAMIN 1000 MCG/ML IJ SOLN
1000.0000 ug | Freq: Once | INTRAMUSCULAR | Status: AC
  Administered 2018-10-26: 1000 ug via INTRAMUSCULAR

## 2018-10-27 NOTE — Assessment & Plan Note (Signed)
Continue on current regimen, recent CT chest stable.  PFTs stable.  Continue on current regimen

## 2018-10-27 NOTE — Assessment & Plan Note (Signed)
Doing well with no increased oxygen demand continue on oxygen at 2 L

## 2018-10-27 NOTE — Patient Instructions (Signed)
Continue on Symbicort Twice daily  . Rinse after use.  Mucinex DM Twice daily  As needed  Cough/congestion  Continue on Oxygen 2l/m .  Activity as tolerated.  Continue on Xarelto .  Work on healthy weight loss.  Follow up Dr. Alva in 3-4  months and As needed   Please contact office for sooner follow up if symptoms do not improve or worsen or seek emergency care   

## 2018-10-27 NOTE — Assessment & Plan Note (Signed)
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  Appears to be clinically stable.  Continue on Xarelto.  No obvious or known bleeding.

## 2018-10-27 NOTE — Progress Notes (Signed)
_0  ID: [name], female    DOB: [birth year], 58 y.o.   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  Previously on prednisone but tapered off.  Admitted 07/21/2016 for RLE DVT-had stoppedXarelto 04/2016 for prior pulmonary embolism  CT scan = chronic appearing subsegmental PE ,on Xarelto lifelong  PFT 05/2014 >> FVC 65%, ratio 69, FEV1 58%, DLCO 62%  VQ Scan 08/17/15 >>LLL &RUL acute PE   ECHO 08/18/15 >>mild LVH , EF 55-60%, Gr 1 DD .   LE Venous Doppler 08/18/15 >>neg DVT , no bakers cyst, ?prominent inguinal lymph node measuring 3.1cm   MRI brain neg [facility] = MRI spine = mild to moderate disc protrusion/left-sided spinal stenosis-chronic pain  NPSG 12/2004 mild, RDI 13/h  SARS-CoV-2 July 23, 2018- negative  10/27/2018 Follow up : Sarcoid , PE  Patient presents for a 71-monthfollow-up.  Patient has underlying sarcoidosis with pulmonary and cutaneous involvement.  She also has a history of recurrent PE on lifelong anticoagulation with Xarelto.  Overall says breathing is doing about the same.  At baseline she is on oxygen 2 L.  Gets short of breath with heavy activities.  She denies any flare of cough wheezing or increased oxygen demands.  Activity level is at baseline.  Last CT chest was July 2020 showed stable sarcoidosis to mildly decreased.  Stable mediastinal and bilateral hilar adenopathy.  PFTs done in July showed stable lung function with an FEV1 at 70%, ratio 81, FVC 67% DLCO 61%.  This was similar to  2016. She remains on Symbicort twice daily. No increased SABA use.  She denies any known bleeding. Says this is the best she has felt in years. Joint pain is much better. Feels the Humira is helping .   She also has cutaneous target doses and lichen planus.  She is on Humira.  Brother has metastatic colon cancer.  Sister is in remission from breast cancer .    Allergies  Allergen Reactions   Belsomra [Suvorexant] Other (See Comments)    FGolden Circleout of bed while asleep: "I'm waking up as I'm falling on the floor;" "Night terrors"   Enalapril Maleate Cough   Latex Hives   Nickel Other (See Comments)    Blisters  Pt has a titanium right and left knee - nickel causes skin irritations that form into blisters and sores   Other Other (See Comments)    Patient has Sarcoidosis and can't tolerate any metals   Morphine And Related Itching and Nausea Only   Doxycycline Diarrhea and Nausea And Vomiting   Hydrochlorothiazide Other (See Comments)    Low potassium levels    Hydroxychloroquine Sulfate Other (See Comments)    Vision changes    Lyrica [Pregabalin] Other (See Comments)    "Made me feel high"   Tape Other (See Comments)    Medical tape causes bruising    Immunization History  Administered Date(s) Administered   H1N1 12/24/2007   Influenza Split 10/11/2010   Influenza Whole 10/14/2007, 10/05/2008, 10/26/2009, 11/13/2011   Influenza,inj,Quad PF,6+ Mos 09/21/2012, 09/24/2013, 10/06/2014,  09/05/2015, 11/06/2016, 09/24/2017, 10/07/2018   PPD Test 11/18/2014   Pneumococcal Conjugate-13 07/30/2010, 12/29/2012   Pneumococcal Polysaccharide-23 12/23/2013   Tdap 06/28/2014    Past Medical History:  Diagnosis Date   Allergic rhinitis    ALLERGIC RHINITIS 10/26/2009   Anxiety    ANXIETY 08/14/2006   B12 DEFICIENCY 2/70/6237   Complication of anesthesia    pt has had difficulty following anesthesia with her knee in 2016-unable to care for herself afterward     Confusion    Depression    takes Lexapro daily   Diabetes mellitus without complication ([place])    was on insulin but has been off since Nov 2015 and now only takes Metformin daily   DYSPNEA 04/28/2009   with exertion   Esophageal reflux    takes Nexium daily   Fibromyalgia    Headache    last migraine 2-46yr ago;takes Topamax daily   History of shingles    Hypertension    takes Coreg daily   Insomnia    takes Nortriptyline nightly    Joint pain    Joint swelling    Left knee pain    Lichen planus    Long-term memory loss    Nocturia    OSA (obstructive sleep apnea)    doesn't use CPAP;sleep study in epic from 2006   Osteoarthritis    Osteoarthritis    Pneumonia    over 30 yrs ago   Protein calorie malnutrition ([place]    Rheumatoid arthritis ([place]    Sarcoidosis    Dr. ZLolita Patella  Short-term memory loss    Shortness of breath    TIA (transient ischemic attack)    Unsteady gait    Urinary urgency    Vitamin D deficiency    is supposed to take Vit D but can't afford it   VITAMIN D DEFICIENCY 08/25/2007    Tobacco History: Social History   Tobacco Use  Smoking Status Former Smoker   Packs/day: 0.50   Years: 10.00   Pack years: 5.00   Types: Cigarettes  Smokeless Tobacco Never Used  Tobacco Comment   quit smoking in 2004   Counseling given: Not Answered Comment: quit smoking in 2004   Outpatient Medications Prior to Visit  Medication Sig Dispense Refill   Adalimumab (HUMIRA PEN-PS/UV/ADOL HS START) 40 MG/0.8ML PNKT Inject 40 mg into the skin once a week.     augmented betamethasone dipropionate (DIPROLENE-AF) 0.05 % ointment Apply topically 2 (two) times daily. 45 g 2   B-D ULTRA-FINE 33 LANCETS MISC Use as instructed to test sugars once  daily 100 each 12   Blood Glucose Monitoring Suppl (ONETOUCH VERIO) w/Device KIT Use to check blood sugar once a day 1 kit 0   budesonide-formoterol (SYMBICORT) 160-4.5 MCG/ACT inhaler  Inhale 2 puffs into the lungs TWICE DAILY 120/4=30 10.2 g 0   carvedilol (COREG) 12.5 MG tablet Take 1 tablet (12.5 mg total) by mouth 2 (two) times daily with a meal. 180 tablet 3   clonazePAM (KLONOPIN) 0.5 MG tablet Take 1 TO 2 tablets (0.5-1 mg total) by mouth at bedtime as needed for anxiety. 60 tablet 3   dexlansoprazole (DEXILANT) 60 MG capsule Take 1 capsule (60 mg total) by mouth daily. 90 capsule 3   Fremanezumab-vfrm (AJOVY) 225 MG/1.5ML SOAJ Inject 225 mg into the skin every 30 (thirty) days. 1 pen 11   furosemide (LASIX) 40 MG tablet TAKE 1 TABLET BY MOUTH EVERY MORNING FOR EDEMA (Patient taking differently: Take 40  mg by mouth daily as needed for fluid or edema. ) 90 tablet 1   gabapentin (NEURONTIN) 300 MG capsule Take 1 capsule (300 mg total) by mouth 2 (two) times daily as needed (for pain). (Patient taking differently: Take 300 mg by mouth 2 (two) times daily. ) 180 capsule 1   glucose blood (ONETOUCH VERIO) test strip Use as instructed to check blood sugar once a day. 100 each 12   hydrOXYzine (ATARAX/VISTARIL) 25 MG tablet Take 1 tablet (25 mg total) by mouth every 8 (eight) hours as needed for itching. 60 tablet 1   metFORMIN (GLUCOPHAGE-XR) 500 MG 24 hr tablet TAKE 2 TABLETS(1000 MG) BY MOUTH DAILY WITH BREAKFAST 180 tablet 2   mupirocin ointment (BACTROBAN) 2 % Use qid on nose sores 30 g 0   potassium chloride (K-DUR) 10 MEQ tablet Take 1 tablet (10 mEq total) by mouth daily. 90 tablet 3   topiramate (TOPAMAX) 50 MG tablet Take 1 tablet (50 mg total) by mouth 2 (two) times daily. 180 tablet 3   triamcinolone cream (KENALOG) 0.5 % Apply 1 application topically 3 (three) times daily. 30 g 0   venlafaxine XR (EFFEXOR-XR) 75 MG 24 hr capsule Take 1 capsule (75 mg total) by mouth daily with breakfast. 30 capsule 11   VENTOLIN HFA 108 (90 Base) MCG/ACT inhaler Inhale 2 puffs into the lungs every 6 (six) hours as needed for wheezing or shortness of breath. 200/8=25  (Patient taking differently: Inhale 2 puffs into the lungs every 6 (six) hours as needed for wheezing or shortness of breath. ) 18 g 11   VOLTAREN 1 % GEL Apply 2 g topically 2 (two) times daily as needed (for pain). 100/4=25 (Patient taking differently: Apply 4 g topically 4 (four) times daily as needed (pain). ) 100 g 2   XARELTO 20 MG TABS tablet Take 1 tablet (20 mg total) by mouth at bedtime. 90 tablet 3   No facility-administered medications prior to visit.      Review of Systems:   Constitutional:   No  weight loss, night sweats,  Fevers, chills, + fatigue, or  lassitude.  HEENT:   No headaches,  Difficulty swallowing,  Tooth/dental problems, or  Sore throat,                No sneezing, itching, ear ache, nasal congestion, post nasal drip,   CV:  No chest pain,  Orthopnea, PND,+ swelling in lower extremities,  No anasarca, dizziness, palpitations, syncope.   GI  No heartburn, indigestion, abdominal pain, nausea, vomiting, diarrhea, change in bowel habits, loss of appetite, bloody stools.   Resp:   No excess mucus, no productive cough,  No non-productive cough,  No coughing up of blood.  No change in color of mucus.  No wheezing.  No chest wall deformity  Skin: no rash or lesions.  GU: no dysuria, change in color of urine, no urgency or frequency.  No flank pain, no hematuria   MS:  No joint pain or swelling.  No decreased range of motion.  No back pain.    Physical Exam  BP 126/78 (BP Location: Left Arm, Cuff Size: Large)    Pulse 68    Temp 97.6 F (36.4 C) (Temporal)    Ht 5' 7" (1.702 m)    Wt 291 lb (132 kg)    LMP 05/19/2003    SpO2 96%    BMI 45.58 kg/m   GEN: A/Ox3; pleasant , NAD, Obese per BMI  HEENT:  [place]/AT,    NOSE-clear, THROAT-clear, no lesions, no postnasal drip or exudate noted. Class 3 MP airway   NECK:  Supple w/ fair ROM; no JVD; normal carotid impulses w/o bruits; no thyromegaly or nodules palpated; no lymphadenopathy.    RESP  Clear  P & A;  w/o, wheezes/ rales/ or rhonchi. no accessory muscle use, no dullness to percussion  CARD:  RRR, no m/r/g, tr  peripheral edema, pulses intact, no cyanosis or clubbing.  GI:   Soft & nt; nml bowel sounds; no organomegaly or masses detected.   Musco: Warm bil, no deformities or joint swelling noted.   Neuro: alert, no focal deficits noted.    Skin: Warm, no lesions or rashes    Lab Results:  CBC  BMET   Imaging: No results found.  cyanocobalamin ((VITAMIN B-12)) injection 1,000 mcg    Date Action Dose Route User   10/07/2018 1312 Given 1000 mcg Intramuscular (Left Deltoid) Cresenciano Lick, CMA    cyanocobalamin ((VITAMIN B-12)) injection 1,000 mcg    Date Action Dose Route User   10/26/2018 1400 Given 1000 mcg Intramuscular (Left Deltoid) Karren Cobble, CMA      PFT Results Latest Ref Rng & Units 07/27/2018 12/30/2016 05/18/2014  FVC-Pre L 2.51 2.07 2.29  FVC-Predicted Pre % 67 54 61  FVC-Post L 2.51 2.18 2.43  FVC-Predicted Post % 67 57 65  Pre FEV1/FVC % % 79 70 69  Post FEV1/FCV % % 81 68 70  FEV1-Pre L 1.99 1.46 1.58  FEV1-Predicted Pre % 68 49 54  FEV1-Post L 2.03 1.49 1.70  DLCO UNC% % 61 41 62  DLCO COR %Predicted % 84 66 91  TLC L 4.27 4.07 -  TLC % Predicted % 77 74 -  RV % Predicted % 84 92 -    No results found for: NITRICOXIDE      Assessment & Plan:   Pulmonary embolism (HCC) Recurrent PE DVT on lifelong anticoagulation.  Appears to be clinically stable.  Continue on Xarelto.  No obvious or known bleeding.  Chronic respiratory failure with hypoxia (HCC) Doing well with no increased oxygen demand continue on oxygen at 2 L  Sarcoidosis Continue on current regimen, recent CT chest stable.  PFTs stable.  Continue on current regimen  Obesity, Class III, BMI 40-49.9 (morbid obesity) ([place]) Work on weight loss     Rexene Edison, NP 10/27/2018

## 2018-10-27 NOTE — Telephone Encounter (Signed)
Have her use the copay card. When the copay card runs out, she can just get a new copay card thanks

## 2018-10-27 NOTE — Patient Outreach (Signed)
[facility]) Care Management  10/27/2018   Ronae Noell [facility] September 27, 1960 [id number]  Subjective: Incoming call from the patient.  Two patient identifiers given.  The patient states that she is doing fine.  She denies any shortness of breath, cough, phlegm more than usual or falls.  She states that she has been having facial pain on the left side that she rates a 5/10 but was seen at her neurologist recently and was given a shot.  She states that she is taking her medications as prescribed and nothing has changed.   She has not been able to exercise and states she has gained weight.  Her weight is 291 lbs.  She has been eating more than usual.  Discussed with her about doing chair exercises in the home and monitoring her food intake.   She verbalized understanding.  She states that the power went out in the apartment complex and her big tank she has did not have air.  Advised her to keep a check on her tank and let [facility] know that it needs to be refilled.  She states that she is going to call them this afternoon about the tank and a bill.  She saw her PCP yesterday and will follow up in January.  The patient was notified that I will be moving into another position and she will follow up with one of my colleagues at her next scheduled call.    Current Medications:  Current Outpatient Medications  Medication Sig Dispense Refill  . Adalimumab (HUMIRA PEN-PS/UV/ADOL HS START) 40 MG/0.8ML PNKT Inject 40 mg into the skin once a week.    [name] augmented betamethasone dipropionate (DIPROLENE-AF) 0.05 % ointment Apply topically 2 (two) times daily. 45 g 2  . B-D ULTRA-FINE 33 LANCETS MISC Use as instructed to test sugars once  daily 100 each 12  . Blood Glucose Monitoring Suppl (ONETOUCH VERIO) w/Device KIT Use to check blood sugar once a day 1 kit 0  . budesonide-formoterol (SYMBICORT) 160-4.5 MCG/ACT inhaler Inhale 2 puffs into the lungs TWICE DAILY 120/4=30 10.2 g 0  .  carvedilol (COREG) 12.5 MG tablet Take 1 tablet (12.5 mg total) by mouth 2 (two) times daily with a meal. 180 tablet 3  . clonazePAM (KLONOPIN) 0.5 MG tablet Take 1 TO 2 tablets (0.5-1 mg total) by mouth at bedtime as needed for anxiety. 60 tablet 3  . dexlansoprazole (DEXILANT) 60 MG capsule Take 1 capsule (60 mg total) by mouth daily. 90 capsule 3  . Fremanezumab-vfrm (AJOVY) 225 MG/1.5ML SOAJ Inject 225 mg into the skin every 30 (thirty) days. 1 pen 11  . furosemide (LASIX) 40 MG tablet TAKE 1 TABLET BY MOUTH EVERY MORNING FOR EDEMA (Patient taking differently: Take 40 mg by mouth daily as needed for fluid or edema. ) 90 tablet 1  . gabapentin (NEURONTIN) 300 MG capsule Take 1 capsule (300 mg total) by mouth 2 (two) times daily as needed (for pain). (Patient taking differently: Take 300 mg by mouth 2 (two) times daily. ) 180 capsule 1  . glucose blood (ONETOUCH VERIO) test strip Use as instructed to check blood sugar once a day. 100 each 12  . hydrOXYzine (ATARAX/VISTARIL) 25 MG tablet Take 1 tablet (25 mg total) by mouth every 8 (eight) hours as needed for itching. 60 tablet 1  . metFORMIN (GLUCOPHAGE-XR) 500 MG 24 hr tablet TAKE 2 TABLETS(1000 MG) BY MOUTH DAILY WITH BREAKFAST 180 tablet 2  . mupirocin ointment (BACTROBAN) 2 % Use  qid on nose sores 30 g 0  . potassium chloride (K-DUR) 10 MEQ tablet Take 1 tablet (10 mEq total) by mouth daily. 90 tablet 3  . topiramate (TOPAMAX) 50 MG tablet Take 1 tablet (50 mg total) by mouth 2 (two) times daily. 180 tablet 3  . triamcinolone cream (KENALOG) 0.5 % Apply 1 application topically 3 (three) times daily. 30 g 0  . venlafaxine XR (EFFEXOR-XR) 75 MG 24 hr capsule Take 1 capsule (75 mg total) by mouth daily with breakfast. 30 capsule 11  . VENTOLIN HFA 108 (90 Base) MCG/ACT inhaler Inhale 2 puffs into the lungs every 6 (six) hours as needed for wheezing or shortness of breath. 200/8=25 (Patient taking differently: Inhale 2 puffs into the lungs every 6  (six) hours as needed for wheezing or shortness of breath. ) 18 g 11  . VOLTAREN 1 % GEL Apply 2 g topically 2 (two) times daily as needed (for pain). 100/4=25 (Patient taking differently: Apply 4 g topically 4 (four) times daily as needed (pain). ) 100 g 2  . XARELTO 20 MG TABS tablet Take 1 tablet (20 mg total) by mouth at bedtime. 90 tablet 3   No current facility-administered medications for this visit.     Functional Status:  In your present state of health, do you have any difficulty performing the following activities: 04/29/2018 04/29/2018  Hearing? N N  Vision? N N  Difficulty concentrating or making decisions? N N  Walking or climbing stairs? N N  Dressing or bathing? N N  Doing errands, shopping? N N  Preparing Food and eating ? N -  Using the Toilet? N -  In the past six months, have you accidently leaked urine? N -  Do you have problems with loss of bowel control? N -  Managing your Medications? N -  Managing your Finances? N -  Housekeeping or managing your Housekeeping? N -  Some recent data might be hidden    Fall/Depression Screening: Fall Risk  10/27/2018 07/29/2018 05/29/2018  Falls in the past year? 0 0 0  Number falls in past yr: - - -  Injury with Fall? - - -  Risk Factor Category  - - -  Risk for fall due to : - - -  Follow up - - -   PHQ 2/9 Scores 04/29/2018 04/29/2018 11/07/2017 10/06/2017 10/02/2017 02/17/2017 01/15/2017  PHQ - 2 Score _0 0 0 2 1  PHQ- 9 Score _1 - - 7 -    Assessment: Patient will continue to benefit from health coach outreach for disease management and support. [facility] CM Care Plan Problem One     Most Recent Value  [facility] Long Term Goal   In 90 days the patient will not have and readmissons to the hospital for COPD exacerbation  [facility] Long Term Goal Start Date  10/27/18  Interventions for Problem One Long Term Goal  Went of over the signs and symptoms of COPD, discussed Action plan, encouraged medication adherence, encouraged exercise in  the home, discussed food intake and diet, encpoouraged the patient to call [facility] regarding her oxygen, encouraged continued follow up at her doctor appointments       Plan:

## 2018-10-27 NOTE — Telephone Encounter (Signed)
PA denied for Aimovig. See denial records below:  You have tried the formulary drugs for the treatment of your condition and they did not work for you. OR - The formulary drugs could cause adverse effects. OR - The formulary drugs would be less effective for your condition than the requested drug. Talk to your prescriber to see if the following covered alternative(s) would be right for you: Aimovig (Prior authorization required. Quantity limit of 1 pen per 30 days.) Emgality 120mg /mL auto-injector (Prior authorization required. Quantity limit of 2 pens per 30 days.) Divalproex sodium DR sprinkle capsules generic Divalproex sodium 24 hour tablets generic Propranolol tablets Propranolol oral solution Contact number  [phone].  Contact number

## 2018-10-27 NOTE — Patient Outreach (Signed)
[facility]) Care Management  10/27/2018  Lexa Morine [facility] Jan 29, 1960 [id number]    1st unsuccessful outreach to the patient for assessment.  No answer.  HIPAA compliant voicemail left with contact  Information.  Plan: RN Health Coach will send letter. RN Health Coach will make outreach attempt to the patient within thirty business days.   Lazaro Arms RN, BSN, [place] Direct Dial:  [phone]  Fax: [phone]

## 2018-10-27 NOTE — Telephone Encounter (Signed)
PA done on cover my meds for Ajovy 225mg /1.29ml.

## 2018-10-27 NOTE — Telephone Encounter (Signed)
Your information has been submitted to [employer]. [employer] will review the request and will issue a decision, typically within 1-3 days from your submission. You can check the updated outcome later by reopening this request.  If [employer] has not responded in 1-3 days or if you have any questions about your ePA request, please contact [employer] at [phone]. If you think there may be a problem with your PA request, use our live chat feature at the bottom right.

## 2018-10-27 NOTE — Assessment & Plan Note (Signed)
Work on weight loss.

## 2018-10-27 NOTE — Addendum Note (Signed)
Addended by: Karren Cobble on: 10/27/2018 11:17 AM   Modules accepted: Orders

## 2018-10-28 NOTE — Telephone Encounter (Signed)
PT has medicare and cannot use co paid card.

## 2018-11-01 NOTE — Telephone Encounter (Signed)
Discuss with patient and lets try to prescribe Emgality instad if patient ok with it thaks

## 2018-11-02 ENCOUNTER — Ambulatory Visit: Payer: Self-pay

## 2018-11-03 ENCOUNTER — Ambulatory Visit (INDEPENDENT_AMBULATORY_CARE_PROVIDER_SITE_OTHER): Payer: Medicare Other | Admitting: Internal Medicine

## 2018-11-03 ENCOUNTER — Other Ambulatory Visit: Payer: Self-pay

## 2018-11-03 ENCOUNTER — Encounter: Payer: Self-pay | Admitting: Physician Assistant

## 2018-11-03 ENCOUNTER — Encounter: Payer: Self-pay | Admitting: Internal Medicine

## 2018-11-03 VITALS — BP 122/78 | HR 70 | Ht 67.0 in | Wt 292.0 lb

## 2018-11-03 DIAGNOSIS — M79672 Pain in left foot: Secondary | ICD-10-CM

## 2018-11-03 DIAGNOSIS — N182 Chronic kidney disease, stage 2 (mild): Secondary | ICD-10-CM

## 2018-11-03 DIAGNOSIS — M79671 Pain in right foot: Secondary | ICD-10-CM

## 2018-11-03 DIAGNOSIS — E785 Hyperlipidemia, unspecified: Secondary | ICD-10-CM | POA: Diagnosis not present

## 2018-11-03 DIAGNOSIS — E1122 Type 2 diabetes mellitus with diabetic chronic kidney disease: Secondary | ICD-10-CM

## 2018-11-03 LAB — POCT GLYCOSYLATED HEMOGLOBIN (HGB A1C): Hemoglobin A1C: 5.7 % — AB (ref 4.0–5.6)

## 2018-11-03 NOTE — Addendum Note (Signed)
Addended by: Cardell Peach I on: 11/03/2018 02:00 PM   Modules accepted: Orders

## 2018-11-03 NOTE — Progress Notes (Signed)
Patient ID: [name], female   DOB: 10/03/1960, 58 y.o.   MRN: [id number]   HPI: [name] is a 58 y.o.-year-old female, self-referred by her, for management of Type 2 (vs. steroid-induced) diabetes, dx in 2012, non-insulin-dependent, fairly well controlled, with complications (CKD,  CAD with diastolic dysfunction, h/o TIA). She previously saw Dr. Loanne Drilling.  Last visit with me 6 months ago.  She was previously on insulin between 2013 and 2015 probably due to prednisone treatment for sarcoidosis.  She is now off the insulin with controlled diabetes.  She was admitted with transaminitis 09/2017 along with RUQ pain and pruritus.  She had lap cholecystectomy 02/11/2018.  Symptoms resolved since then.  Reviewed her HbA1c levels: Lab Results  Component Value Date   HGBA1C 6.4 04/07/2018   HGBA1C 5.9 (H) 02/05/2018   HGBA1C 5.2 10/28/2017   HGBA1C 5.8 (H) 06/26/2017   HGBA1C 6.7 (H) 04/02/2017   HGBA1C 7.1 (H) 07/22/2016   HGBA1C 5.9 01/30/2016   HGBA1C 5.8 (H) 07/04/2015   HGBA1C 6.0 04/03/2015   HGBA1C 5.8 02/01/2015   HGBA1C 6.2 06/28/2014   HGBA1C 6.8 (H) 03/24/2014   HGBA1C 6.8 (H) 08/02/2013   HGBA1C 6.4 04/27/2013   HGBA1C 7.3 (H) 01/27/2013   HGBA1C 7.1 (H) 09/17/2012   HGBA1C 6.7 (H) 06/02/2012   HGBA1C 6.1 03/03/2012   HGBA1C 6.1 11/26/2011   HGBA1C 16.4 (H) 05/09/2011   Pt is on a regimen of: - Metformin ER 1000 mg in a.m. She was previously on Januvia 50 mg, stopped at 10/2017  She is checking sugars 0 to once a day - am: 120-125 >> 115-124 >> 110, 115-145, 150, 163 - 2h after b'fast: n/c >> 154 >> n/c - before lunch: n/c - 2h after lunch: n/c  - before dinner: n/c >> 125-130 - 2h after dinner: n/c - bedtime: 135-140 >> 135-150 >> n/c - nighttime: n/c Lowest sugar was 105 >> 115 >> 115;  It is unclear which level she has hypoglycemia awareness Highest sugar was 150 >> 154 >> 163.  Glucometer: AccuChek guide me Pt's meals are: -  Breakfast: watermelon; cereal + 2% milk; coffee; apple sauce or peach cup; or grits - Lunch: may skip; sandwich or soup - Dinner: cereal or sandwich or baked chicken + veggies (greens, corn) + rice or spaghetti >> salmon/organic chicken breast + broccoli/green beans + starch - Snacks: 1 whole bag of popcorn every night  + History of CKD-improved, improved, last BUN/creatinine:  Lab Results  Component Value Date   BUN 18 08/27/2018   BUN 17 04/07/2018   CREATININE 0.90 08/27/2018   CREATININE 0.91 04/07/2018   + HL; last set of lipids: Lab Results  Component Value Date   CHOL 220 (H) 08/27/2018   HDL 88 08/27/2018   LDLCALC 111 (H) 08/27/2018   LDLDIRECT 107.0 06/30/2017   TRIG 107 08/27/2018   CHOLHDL 2.5 08/27/2018  Not on a statin due to previous transaminitis and pruritus. - last eye exam in 09/2018: No DR; she had an eye infection in 06/2017 >> had sx. She could not afford the co-pays in the past. -No numbness and tingling in her feet, but has pain in both feet despite changing her shoes.  Pt has FH of DM in mother, sister.  She has lichen planus on legs. Getting inj's.  ROS: Constitutional: no weight gain/no weight loss, no fatigue, no subjective hyperthermia, no subjective hypothermia Eyes: no Blurry vision, no xerophthalmia ENT: no sore throat, no nodules palpated  in neck, no dysphagia, no odynophagia, no hoarseness Cardiovascular: no CP/+ SOB/no palpitations/no leg swelling Respiratory: no cough/+ SOB/no wheezing Gastrointestinal: no N/no V/no D/no C/no acid reflux Musculoskeletal: + Muscle aches/+ joint aches Skin: no rashes, + hair loss Neurological: no tremors/no numbness/no tingling/no dizziness, + HA  I reviewed pt's medications, allergies, PMH, social hx, family hx, and changes were documented in the history of present illness. Otherwise, unchanged from my initial visit note.  Past Medical History:  Diagnosis Date  . Allergic rhinitis   . ALLERGIC RHINITIS  10/26/2009  . Anxiety   . ANXIETY 08/14/2006  . B12 DEFICIENCY 08/25/2007  . Complication of anesthesia    pt has had difficulty following anesthesia with her knee in 2016-unable to care for herself afterward  . Confusion   . Depression    takes Lexapro daily  . Diabetes mellitus without complication ([place])    was on insulin but has been off since Nov 2015 and now only takes Metformin daily  . DYSPNEA 04/28/2009   with exertion  . Esophageal reflux    takes Nexium daily  . Fibromyalgia   . Headache    last migraine 2-69yr ago;takes Topamax daily  . History of shingles   . Hypertension    takes Coreg daily  . Insomnia    takes Nortriptyline nightly   . Joint pain   . Joint swelling   . Left knee pain   . Lichen planus   . Long-term memory loss   . Nocturia   . OSA (obstructive sleep apnea)    doesn't use CPAP;sleep study in epic from 2006  . Osteoarthritis   . Osteoarthritis   . Pneumonia    over 30 yrs ago  . Protein calorie malnutrition ([facility]   . Rheumatoid arthritis ([facility]   . Sarcoidosis    Dr. ZLolita Patella . Short-term memory loss   . Shortness of breath   . TIA (transient ischemic attack)   . Unsteady gait   . Urinary urgency   . Vitamin D deficiency    is supposed to take Vit D but can't afford it  . VITAMIN D DEFICIENCY 08/25/2007   Past Surgical History:  Procedure Laterality Date  . APPENDECTOMY    . arthroscopic knee surgery Right 11-12-04  . AXILLARY ABCESS IRRIGATION AND DEBRIDEMENT  Jul & Aug2012  . CARPAL TUNNEL RELEASE Left 05/23/2014   Procedure: CARPAL TUNNEL RELEASE;  Surgeon: SMeredith Pel MD;  [place] [facility]  Service: Orthopedics;  Laterality: Left;  . CHOLECYSTECTOMY  02/11/2018   LAPROSCOPIC  . CHOLECYSTECTOMY N/A 02/11/2018   Procedure: LAPAROSCOPIC CHOLECYSTECTOMY WITH INTRAOPERATIVE CHOLANGIOGRAM ERAS PATHWAY;  Surgeon: TDonnie Mesa MD;  [place] [facility]  Service: General;  Laterality: N/A;  . cyst removed from top of buttocks  at age  58 . ENDOMETRIAL ABLATION    . IR CHOLANGIOGRAM EXISTING TUBE  11/21/2017  . IR CHOLANGIOGRAM EXISTING TUBE  01/16/2018  . IR EXCHANGE BILIARY DRAIN  11/10/2017  . IR PATIENT EVAL TECH 0-60 MINS  02/03/2018  . IR PERC CHOLECYSTOSTOMY  09/13/2017  . IR RADIOLOGIST EVAL & MGMT  10/08/2017  . LACRIMAL DUCT EXPLORATION Right 06/26/2017   Procedure: LACRIMAL DUCT EXPLORATION AND ETHMOIDECTOMY;  Surgeon: AClista Bernhardt MD;  [place] [facility]  Service: Ophthalmology;  Laterality: Right;  . TEAR DUCT PROBING Right 06/26/2017   Procedure: TEAR DUCT PROBING WITH STENT;  Surgeon: AClista Bernhardt MD;  [place] [facility]  Service: Ophthalmology;  Laterality: Right;  .  TOTAL KNEE ARTHROPLASTY Right 11/15/2014   Procedure: TOTAL RIGHT KNEE ARTHROPLASTY;  Surgeon: Meredith Pel, MD;  Location: [place];  Service: Orthopedics;  Laterality: Right;  . TOTAL KNEE ARTHROPLASTY Left 07/13/2015   Procedure: LEFT TOTAL KNEE ARTHROPLASTY;  Surgeon: Meredith Pel, MD;  Location: [place];  Service: Orthopedics;  Laterality: Left;   Social History   Socioeconomic History  . Marital status: Single    Spouse name: Not on file  . Number of children: 1  . Years of education: Not on file  . Highest education level: Not on file  Occupational History  . Occupation: disabled  Social Needs  . Financial resource strain: Somewhat hard  . Food insecurity    Worry: Sometimes true    Inability: Sometimes true  . Transportation needs    Medical: No    Non-medical: Yes  Tobacco Use  . Smoking status: Former Smoker    Packs/day: 0.50    Years: 10.00    Pack years: 5.00    Types: Cigarettes  . Smokeless tobacco: Never Used  . Tobacco comment: quit smoking in 2004  Substance and Sexual Activity  . Alcohol use: Not Currently    Alcohol/week: 1.0 standard drinks    Types: 1 Glasses of wine per week    Comment: occassionaly  . Drug use: No  . Sexual activity: Not Currently  Lifestyle  . Physical activity    Days  per week: 0 days    Minutes per session: 0 min  . Stress: To some extent  Relationships  . Social connections    Talks on phone: More than three times a week    Gets together: Once a week    Attends religious service: 1 to 4 times per year    Active member of club or organization: No    Attends meetings of clubs or organizations: Never    Relationship status: Not on file  . Intimate partner violence    Fear of current or ex partner: Not on file    Emotionally abused: Not on file    Physically abused: Not on file    Forced sexual activity: Not on file  Other Topics Concern  . Not on file  Social History Narrative   Single, broke up with partner in 2008.      Lives at home alone   Caffeine: stopped 2009   Current Outpatient Medications on File Prior to Visit  Medication Sig Dispense Refill  . Adalimumab (HUMIRA PEN-PS/UV/ADOL HS START) 40 MG/0.8ML PNKT Inject 40 mg into the skin once a week.    [name] augmented betamethasone dipropionate (DIPROLENE-AF) 0.05 % ointment Apply topically 2 (two) times daily. 45 g 2  . B-D ULTRA-FINE 33 LANCETS MISC Use as instructed to test sugars once  daily 100 each 12  . Blood Glucose Monitoring Suppl (ONETOUCH VERIO) w/Device KIT Use to check blood sugar once a day 1 kit 0  . budesonide-formoterol (SYMBICORT) 160-4.5 MCG/ACT inhaler Inhale 2 puffs into the lungs TWICE DAILY 120/4=30 10.2 g 0  . carvedilol (COREG) 12.5 MG tablet Take 1 tablet (12.5 mg total) by mouth 2 (two) times daily with a meal. 180 tablet 3  . clonazePAM (KLONOPIN) 0.5 MG tablet Take 1 TO 2 tablets (0.5-1 mg total) by mouth at bedtime as needed for anxiety. 60 tablet 3  . dexlansoprazole (DEXILANT) 60 MG capsule Take 1 capsule (60 mg total) by mouth daily. 90 capsule 3  . Fremanezumab-vfrm (AJOVY) 225 MG/1.5ML SOAJ Inject  225 mg into the skin every 30 (thirty) days. 1 pen 11  . furosemide (LASIX) 40 MG tablet TAKE 1 TABLET BY MOUTH EVERY MORNING FOR EDEMA (Patient taking differently:  Take 40 mg by mouth daily as needed for fluid or edema. ) 90 tablet 1  . gabapentin (NEURONTIN) 300 MG capsule Take 1 capsule (300 mg total) by mouth 2 (two) times daily as needed (for pain). (Patient taking differently: Take 300 mg by mouth 2 (two) times daily. ) 180 capsule 1  . glucose blood (ONETOUCH VERIO) test strip Use as instructed to check blood sugar once a day. 100 each 12  . hydrOXYzine (ATARAX/VISTARIL) 25 MG tablet Take 1 tablet (25 mg total) by mouth every 8 (eight) hours as needed for itching. 60 tablet 1  . metFORMIN (GLUCOPHAGE-XR) 500 MG 24 hr tablet TAKE 2 TABLETS(1000 MG) BY MOUTH DAILY WITH BREAKFAST 180 tablet 2  . mupirocin ointment (BACTROBAN) 2 % Use qid on nose sores 30 g 0  . potassium chloride (K-DUR) 10 MEQ tablet Take 1 tablet (10 mEq total) by mouth daily. 90 tablet 3  . topiramate (TOPAMAX) 50 MG tablet Take 1 tablet (50 mg total) by mouth 2 (two) times daily. 180 tablet 3  . triamcinolone cream (KENALOG) 0.5 % Apply 1 application topically 3 (three) times daily. 30 g 0  . venlafaxine XR (EFFEXOR-XR) 75 MG 24 hr capsule Take 1 capsule (75 mg total) by mouth daily with breakfast. 30 capsule 11  . VENTOLIN HFA 108 (90 Base) MCG/ACT inhaler Inhale 2 puffs into the lungs every 6 (six) hours as needed for wheezing or shortness of breath. 200/8=25 (Patient taking differently: Inhale 2 puffs into the lungs every 6 (six) hours as needed for wheezing or shortness of breath. ) 18 g 11  . VOLTAREN 1 % GEL Apply 2 g topically 2 (two) times daily as needed (for pain). 100/4=25 (Patient taking differently: Apply 4 g topically 4 (four) times daily as needed (pain). ) 100 g 2  . XARELTO 20 MG TABS tablet Take 1 tablet (20 mg total) by mouth at bedtime. 90 tablet 3   No current facility-administered medications on file prior to visit.    Allergies  Allergen Reactions  . Belsomra [Suvorexant] Other (See Comments)    [place] out of bed while asleep: "I'm waking up as I'm falling on the  floor;" "Night terrors"  . Enalapril Maleate Cough  . Latex Hives  . Nickel Other (See Comments)    Blisters  Pt has a titanium right and left knee - nickel causes skin irritations that form into blisters and sores  . Other Other (See Comments)    Patient has Sarcoidosis and can't tolerate any metals  . Morphine And Related Itching and Nausea Only  . Doxycycline Diarrhea and Nausea And Vomiting  . Hydrochlorothiazide Other (See Comments)    Low potassium levels   . Hydroxychloroquine Sulfate Other (See Comments)    Vision changes   . Lyrica [Pregabalin] Other (See Comments)    "Made me feel high"  . Tape Other (See Comments)    Medical tape causes bruising   Family History  Problem Relation Age of Onset  . Heart disease Mother   . Kidney disease Mother   . Cancer Father        leukemia  . Hypertension Other   . Coronary artery disease Other        female 1st degree relative <60  . Heart failure Other  congestive  . Coronary artery disease Other        Female 1st degree relative <50  . Breast cancer Other        1st degree relative <50 S  . Breast cancer Sister   . Heart Problems Brother   . Colon cancer Brother     PE: BP 122/78   Pulse 70   Ht _0  (1.702 m)   Wt 292 lb (132.5 kg)   LMP 05/19/2003   SpO2 97% Comment: on 02  BMI 45.73 kg/m  Wt Readings from Last 3 Encounters:  11/03/18 292 lb (132.5 kg)  10/27/18 291 lb (132 kg)  10/26/18 291 lb (132 kg)   Constitutional: overweight, in NAD, + on oxygen Eyes: PERRLA, EOMI, no exophthalmos ENT: moist mucous membranes, no thyromegaly, no cervical lymphadenopathy Cardiovascular: RRR, No MRG Respiratory: CTA B Gastrointestinal: abdomen soft, NT, ND, BS+ Musculoskeletal: no deformities, strength intact in all 4 Skin: moist, warm, no rashes Neurological: no tremor with outstretched hands, DTR normal in all 4   ASSESSMENT: 1. DM2, non-insulin-dependent, controlled, with complications - h/o CKD - CAD -  cerebrovascular ds - s/p TIA  2. HL  3.  Obesity-class III  4.  Foot pain  PLAN:  1. Patient with longstanding, previously uncontrolled diabetes, now better controlled only on Metformin.  At this visit, sugars are still close to target, however, many times he has higher blood sugars in the morning due to eating a popcorn every night.  We discussed to reduce the amount of popcorn she eats and I suggested 100-calorie bags.  She will try this.  For now, to improve the sugars in the morning, I advised her to move the metformin at night.  She agrees to try this. -At last visit she was not checking sugars and I advised her to start doing so.  At that time, we gave her an Accu-Chek guide me meter and sent supplies to her pharmacy.  She is not checking sugars more frequently. - I suggested to:  Patient Instructions  Try to call and schedule an appt at the [facility]: Get online care: [email or web address] Address: [address], [place], [place] [address] Phone: [phone] Appointments: [email or web address]  Please continue: - Metformin ER 1000 mg but move it to dinnertime  Please return in 6 months with your sugar log.   - we checked her HbA1c: 5.7% (lower) - advised to check sugars at different times of the day - 1x a day, rotating check times - advised for yearly eye exams >> she is UTD - return to clinic in 6 months   2. HL - Reviewed latest lipid panel from 08/2018, LDL above goal, slightly higher, the rest of the fractions at goal Lab Results  Component Value Date   CHOL 220 (H) 08/27/2018   HDL 88 08/27/2018   LDLCALC 111 (H) 08/27/2018   LDLDIRECT 107.0 06/30/2017   TRIG 107 08/27/2018   CHOLHDL 2.5 08/27/2018  -We discussed in the past about starting pravastatin but she has a history of transaminitis and also pruritus so we did not it yet  3.  Obesity-class III  -Almost 30 pound weight gain since last visit! -Continue metformin which is weight stabilizing long-term  4.  Foot  pain -Bilateral -Ball of the foot -Tried new shoes but without results -I suggested to see a podiatrist, given information for [facility]  Philemon Kingdom, MD PhD [facility] Endocrinology

## 2018-11-03 NOTE — Patient Instructions (Addendum)
Try to call and schedule an appt at the [facility]: Get online care: [email or web address] Address: [address], [place], [place] [address] Phone: [phone] Appointments: [email or web address]  Please continue: - Metformin ER 1000 mg but move it to dinnertime  Please return in 6 months with your sugar log.

## 2018-11-12 NOTE — Unmapped (Signed)
[facility] Refill Coordination Note    Specialty Medication(s) to be Shipped:   Inflammatory Disorders: Humira    Other medication(s) to be shipped: na     Carolyn Newton, DOB: [birth year]  Phone: [phone] (home)       All above HIPAA information was verified with patient.     Completed refill call assessment today to schedule patient's medication shipment from the [facility] [phone]).       Specialty medication(s) and dose(s) confirmed: Regimen is correct and unchanged.   Changes to medications: Carolyn Newton reports no changes at this time.  Changes to insurance: No  Questions for the pharmacist: No    Confirmed patient received Welcome Packet with first shipment. The patient will receive a drug information handout for each medication shipped and additional FDA Medication Guides as required.       DISEASE/MEDICATION-SPECIFIC INFORMATION        For patients on injectable medications: Patient currently has 3 doses left.  Next injection is scheduled for 102920.    SPECIALTY MEDICATION ADHERENCE     Medication Adherence    Patient reported X missed doses in the last month: 0  Specialty Medication: humira cf 40 mg/0.4 ml  Patient is on additional specialty medications: No  Any gaps in refill history greater than 2 weeks in the last 3 months: no  Demonstrates understanding of importance of adherence: yes  Informant: patient  Reliability of informant: reliable  Confirmed plan for next specialty medication refill: delivery by pharmacy  Refills needed for supportive medications: not needed                humira cf 40 mg/0.4 ml. 21 days on hand      SHIPPING     Shipping address confirmed in Epic.     Delivery Scheduled: Yes, Expected medication delivery date: 110420.     Medication will be delivered via UPS to the prescription address in Epic WAM.    Carolyn Newton D Garth Diffley   [facility] Specialty Technician

## 2018-11-13 ENCOUNTER — Ambulatory Visit: Payer: Medicare Other | Admitting: Physician Assistant

## 2018-11-17 ENCOUNTER — Ambulatory Visit (INDEPENDENT_AMBULATORY_CARE_PROVIDER_SITE_OTHER): Payer: Medicare Other | Admitting: Sports Medicine

## 2018-11-17 ENCOUNTER — Encounter: Payer: Self-pay | Admitting: Sports Medicine

## 2018-11-17 ENCOUNTER — Other Ambulatory Visit: Payer: Self-pay | Admitting: Sports Medicine

## 2018-11-17 ENCOUNTER — Ambulatory Visit (INDEPENDENT_AMBULATORY_CARE_PROVIDER_SITE_OTHER): Payer: Medicare Other

## 2018-11-17 ENCOUNTER — Other Ambulatory Visit: Payer: Self-pay

## 2018-11-17 VITALS — BP 132/77 | HR 68 | Resp 16

## 2018-11-17 DIAGNOSIS — L439 Lichen planus, unspecified: Secondary | ICD-10-CM | POA: Diagnosis not present

## 2018-11-17 DIAGNOSIS — M79672 Pain in left foot: Secondary | ICD-10-CM | POA: Diagnosis not present

## 2018-11-17 DIAGNOSIS — M722 Plantar fascial fibromatosis: Secondary | ICD-10-CM | POA: Diagnosis not present

## 2018-11-17 DIAGNOSIS — B351 Tinea unguium: Secondary | ICD-10-CM | POA: Diagnosis not present

## 2018-11-17 DIAGNOSIS — M79674 Pain in right toe(s): Secondary | ICD-10-CM | POA: Diagnosis not present

## 2018-11-17 DIAGNOSIS — M21619 Bunion of unspecified foot: Secondary | ICD-10-CM

## 2018-11-17 DIAGNOSIS — M79671 Pain in right foot: Secondary | ICD-10-CM

## 2018-11-17 DIAGNOSIS — M79675 Pain in left toe(s): Secondary | ICD-10-CM | POA: Diagnosis not present

## 2018-11-17 DIAGNOSIS — E1142 Type 2 diabetes mellitus with diabetic polyneuropathy: Secondary | ICD-10-CM

## 2018-11-17 MED ORDER — TRIAMCINOLONE ACETONIDE 10 MG/ML IJ SUSP
10.0000 mg | Freq: Once | INTRAMUSCULAR | Status: AC
  Administered 2018-11-17: 21:00:00 10 mg

## 2018-11-17 MED FILL — HUMIRA PEN CITRATE FREE 40 MG/0.4 ML: 28 days supply | Qty: 4 | Fill #8

## 2018-11-17 MED FILL — HUMIRA PEN CITRATE FREE 40 MG/0.4 ML: 28 days supply | Qty: 4 | Fill #8 | Status: AC

## 2018-11-17 NOTE — Progress Notes (Signed)
Subjective: [name] is a 58 y.o. female patient with history of diabetes who presents to office today complaining of 1.  Foot pain along the plantar surfaces of both feet worse at heels sometimes when she is walking feels like she is walking on Legos and has been going on for the last 6 or 7 months reports that her feet do not burn or tingle but there is general pain that is very intense sometimes that last for a few days and then resolves or goes away on its own.  Patient also complains of 2.  Toe pain at bilateral second toes and at right bunion and reports that her toes are starting to dry up and change. 3. Patient also complains of long,mildly painful nails  while ambulating in shoes; unable to trim. Patient states that the glucose reading this morning was not recorded today last A1c noted at 5.7.  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  Patient denies any new changes in medication or new problems. Patient denies any new cramping, numbness, burning or tingling in the legs that is different from before but is currently on gabapentin.  Review of Systems  All other systems reviewed and are negative.    Patient Active Problem List   Diagnosis Date Noted  . Family history of colon cancer 10/26/2018  . Myringotomy tube status 08/21/2018  . Vaginal candidiasis 07/15/2018  . Nasal crusting 06/12/2018  . Chronic serous otitis media of left ear 06/04/2018  . Mixed conductive and sensorineural hearing loss of left ear with restricted hearing of right ear 06/04/2018  . Chronic cholecystitis with calculus 02/11/2018  . Thrush, oral 01/15/2018  . Nasal sinus congestion 01/15/2018  . Dyslipidemia 10/28/2017  . Pruritus 10/23/2017  . Abnormal liver function test   . Anemia 09/24/2017  . Migraines 09/17/2017  . Cholecystitis,  acute 09/12/2017  . COPD (chronic obstructive pulmonary disease) ([place]) 12/30/2016  . Diastolic dysfunction 25/05/3974  . OSA (obstructive sleep apnea) 09/30/2016  . Chronic respiratory failure with hypoxia ([place]) 08/23/2016  . Pulmonary embolism ([place]) 07/21/2016  . DVT (deep venous thrombosis) ([place]) 07/21/2016  . Weight gain 12/20/2015  . Controlled type 2 diabetes mellitus with chronic kidney disease, without long-term current use of insulin ([place]) 02/01/2015  . Abnormal SPEP 06/04/2013  . Memory loss 04/27/2013  . Panic attacks 03/17/2013  . Rash 10/27/2012  . Pulmonary nodules 07/20/2012  . CAD in native artery 05/28/2012  . Seborrheic dermatitis 01/20/2012  . Obesity, Class III, BMI 40-49.9 (morbid obesity) ([place]) 03/07/2010  . INSOMNIA, CHRONIC 03/07/2010  . Allergic rhinitis 10/26/2009  . Essential hypertension 08/27/2007  . B12 deficiency 08/25/2007  . Vitamin D deficiency 08/25/2007  . SINUSITIS, ACUTE 02/02/2007  . Osteoarthritis 02/02/2007  . Sarcoidosis 08/14/2006  . Anxiety state 08/14/2006  . Major depressive disorder, recurrent episode ([place]) 08/14/2006  . GERD 08/14/2006   Current Outpatient Medications on File Prior to Visit  Medication Sig Dispense Refill  . Adalimumab (HUMIRA PEN-PS/UV/ADOL HS START) 40 MG/0.8ML PNKT Inject 40 mg into the skin once a week.    [name] augmented betamethasone dipropionate (DIPROLENE-AF) 0.05 % ointment Apply topically 2 (two) times daily. 45 g 2  . B-D ULTRA-FINE 33 LANCETS MISC Use as instructed to test sugars once  daily 100 each 12  . Blood Glucose Monitoring Suppl (ONETOUCH VERIO)  w/Device KIT Use to check blood sugar once a day 1 kit 0  . budesonide-formoterol (SYMBICORT) 160-4.5 MCG/ACT inhaler Inhale 2 puffs into the lungs TWICE DAILY 120/4=30 10.2 g 0  . carvedilol (COREG) 12.5 MG tablet Take 1 tablet (12.5 mg total) by mouth 2 (two) times daily with a meal. 180 tablet 3  . clonazePAM (KLONOPIN) 0.5 MG tablet Take 1 TO 2 tablets  (0.5-1 mg total) by mouth at bedtime as needed for anxiety. 60 tablet 3  . dexlansoprazole (DEXILANT) 60 MG capsule Take 1 capsule (60 mg total) by mouth daily. 90 capsule 3  . Fremanezumab-vfrm (AJOVY) 225 MG/1.5ML SOAJ Inject 225 mg into the skin every 30 (thirty) days. 1 pen 11  . furosemide (LASIX) 40 MG tablet TAKE 1 TABLET BY MOUTH EVERY MORNING FOR EDEMA (Patient taking differently: Take 40 mg by mouth daily as needed for fluid or edema. ) 90 tablet 1  . gabapentin (NEURONTIN) 300 MG capsule Take 1 capsule (300 mg total) by mouth 2 (two) times daily as needed (for pain). (Patient taking differently: Take 300 mg by mouth 2 (two) times daily. ) 180 capsule 1  . glucose blood (ONETOUCH VERIO) test strip Use as instructed to check blood sugar once a day. 100 each 12  . hydrOXYzine (ATARAX/VISTARIL) 25 MG tablet Take 1 tablet (25 mg total) by mouth every 8 (eight) hours as needed for itching. 60 tablet 1  . metFORMIN (GLUCOPHAGE-XR) 500 MG 24 hr tablet TAKE 2 TABLETS(1000 MG) BY MOUTH DAILY WITH BREAKFAST 180 tablet 2  . mupirocin ointment (BACTROBAN) 2 % Use qid on nose sores 30 g 0  . OXYGEN Inhale into the lungs. Takes 2 liters every day (24/7)    . potassium chloride (K-DUR) 10 MEQ tablet Take 1 tablet (10 mEq total) by mouth daily. 90 tablet 3  . topiramate (TOPAMAX) 50 MG tablet Take 1 tablet (50 mg total) by mouth 2 (two) times daily. 180 tablet 3  . triamcinolone cream (KENALOG) 0.5 % Apply 1 application topically 3 (three) times daily. 30 g 0  . venlafaxine XR (EFFEXOR-XR) 75 MG 24 hr capsule Take 1 capsule (75 mg total) by mouth daily with breakfast. 30 capsule 11  . VENTOLIN HFA 108 (90 Base) MCG/ACT inhaler Inhale 2 puffs into the lungs every 6 (six) hours as needed for wheezing or shortness of breath. 200/8=25 (Patient taking differently: Inhale 2 puffs into the lungs every 6 (six) hours as needed for wheezing or shortness of breath. ) 18 g 11  . VOLTAREN 1 % GEL Apply 2 g topically 2  (two) times daily as needed (for pain). 100/4=25 (Patient taking differently: Apply 4 g topically 4 (four) times daily as needed (pain). ) 100 g 2  . XARELTO 20 MG TABS tablet Take 1 tablet (20 mg total) by mouth at bedtime. 90 tablet 3   No current facility-administered medications on file prior to visit.    Allergies  Allergen Reactions  . Belsomra [Suvorexant] Other (See Comments)    [place] out of bed while asleep: "I'm waking up as I'm falling on the floor;" "Night terrors"  . Enalapril Maleate Cough  . Latex Hives  . Nickel Other (See Comments)    Blisters  Pt has a titanium right and left knee - nickel causes skin irritations that form into blisters and sores  . Other Other (See Comments)    Patient has Sarcoidosis and can't tolerate any metals  . Morphine And Related Itching and Nausea Only  .  Doxycycline Diarrhea and Nausea And Vomiting  . Hydrochlorothiazide Other (See Comments)    Low potassium levels   . Hydroxychloroquine Sulfate Other (See Comments)    Vision changes   . Lyrica [Pregabalin] Other (See Comments)    "Made me feel high"  . Tape Other (See Comments)    Medical tape causes bruising    Recent Results (from the past 2160 hour(s))  Comp Met (CMET)     Status: Abnormal   Collection Time: 08/27/18  3:40 PM  Result Value Ref Range   Glucose 87 65 - 99 mg/dL   BUN 18 6 - 24 mg/dL   Creatinine, Ser 0.90 0.57 - 1.00 mg/dL   GFR calc non Af Amer 71 >59 mL/min/1.73   GFR calc Af Amer 82 >59 mL/min/1.73   BUN/Creatinine Ratio 20 9 - 23   Sodium 143 134 - 144 mmol/L   Potassium 4.4 3.5 - 5.2 mmol/L   Chloride 108 (H) 96 - 106 mmol/L   CO2 23 20 - 29 mmol/L   Calcium 9.2 8.7 - 10.2 mg/dL   Total Protein 7.0 6.0 - 8.5 g/dL   Albumin 3.9 3.8 - 4.9 g/dL   Globulin, Total 3.1 1.5 - 4.5 g/dL   Albumin/Globulin Ratio 1.3 1.2 - 2.2   Bilirubin Total <0.2 0.0 - 1.2 mg/dL   Alkaline Phosphatase 96 39 - 117 IU/L   AST 19 0 - 40 IU/L   ALT 23 0 - 32 IU/L  Lipid panel      Status: Abnormal   Collection Time: 08/27/18  3:40 PM  Result Value Ref Range   Cholesterol, Total 220 (H) 100 - 199 mg/dL   Triglycerides 107 0 - 149 mg/dL   HDL 88 >39 mg/dL   VLDL Cholesterol Cal 21 5 - 40 mg/dL   LDL Calculated 111 (H) 0 - 99 mg/dL   Chol/HDL Ratio 2.5 0.0 - 4.4 ratio    Comment:                                   T. Chol/HDL Ratio                                             Men  Women                               1/2 Avg.Risk  3.4    3.3                                   Avg.Risk  5.0    4.4                                2X Avg.Risk  9.6    7.1                                3X Avg.Risk 23.4   11.0   HM DIABETES EYE EXAM     Status: None   Collection Time: 09/01/18 12:00 AM  Result Value Ref Range   HM Diabetic  Eye Exam No Retinopathy No Retinopathy  POCT glycosylated hemoglobin (Hb A1C)     Status: Abnormal   Collection Time: 11/03/18  1:59 PM  Result Value Ref Range   Hemoglobin A1C 5.7 (A) 4.0 - 5.6 %   HbA1c POC (<> result, manual entry)     HbA1c, POC (prediabetic range)     HbA1c, POC (controlled diabetic range)      Objective: General: Patient is awake, alert, and oriented x 3 and in no acute distress.  Integument: Skin is warm, dry and supple bilateral. Nails are tender, long, thickened and  dystrophic with subungual debris, consistent with onychomycosis, 1-5 bilateral. No signs of infection. No open lesions or preulcerative lesions present bilateral.  Patient has dry hyperpigmented scales noted to the dorsal surface of the left greater than right foot consistent with history of lichen planus.  Remaining integument unremarkable.  Vasculature:  Dorsalis Pedis pulse 1/4 bilateral. Posterior Tibial pulse 1/4 bilateral.  Capillary fill time <5 sec 1-5 bilateral. Positive hair growth to the level of the digits. Temperature gradient within normal limits.  Mild varicosities present bilateral.  Trace edema present bilateral.   Neurology: The patient  has diminished sensation measured with a 5.07/10g Semmes Weinstein Monofilament at all pedal sites bilateral. Vibratory sensation diminished bilateral with tuning fork. No Babinski sign present bilateral.   Musculoskeletal: Asymptomatic pes planus pedal deformities noted bilateral. Muscular strength 5/5 in all lower extremity muscular groups bilateral without pain on range of motion except at plantar heels bilateral at plantar fascial insertion and at ankle where there is limited ankle joint range of motion.  There is also bunion deformity right greater than left with lesser hammertoe deformity left greater than right. No tenderness with calf compression bilateral.  Assessment and Plan: Problem List Items Addressed This Visit    None    Visit Diagnoses    Pain in both feet    -  Primary   Relevant Orders   DG Foot Complete Left (Completed)   Bunion       Plantar fasciitis, bilateral       Pain due to onychomycosis of toenails of both feet       Diabetic polyneuropathy associated with type 2 diabetes mellitus (HCC)       Lichen planus          -Examined patient. -Discussed and educated patient on diabetic foot care, especially with  regards to the vascular, neurological and musculoskeletal systems.  -Stressed the importance of good glycemic control and the detriment of not  controlling glucose levels in relation to the foot. -Mechanically debrided all nails 1-5 bilateral using sterile nail nipper and filed with dremel without incident  -After oral consent and aseptic prep, injected a mixture containing 1 ml of 2%  plain lidocaine, 1 ml 0.5% plain marcaine, 0.5 ml of kenalog 10 and 0.5 ml of dexamethasone phosphate into bilateral heels at plantar fascial insertion without complication. Post-injection care discussed with patient.  -Applied heel cushions bilateral and dispensed tube foam bilateral first toes to prevent any pain or rubbing at bunions right greater than left -Advised patient  that she is not a good candidate for any type of elective foot surgery due to her inflammatory history as well as her multiple medical conditions -Advised patient that she would be a good candidate for diabetic shoes.  Office to contact her regarding this and to schedule her to see Liliane Channel if she wants to proceed with getting them. -Answered all patient questions -  Patient to return in 1 month for follow-up on plantar fasciitis -Patient advised to call the office if any problems or questions arise in the meantime.  Landis Martins, DPM

## 2018-11-17 NOTE — Patient Instructions (Signed)

## 2018-11-19 ENCOUNTER — Encounter: Payer: Self-pay | Admitting: Gastroenterology

## 2018-11-19 ENCOUNTER — Ambulatory Visit (INDEPENDENT_AMBULATORY_CARE_PROVIDER_SITE_OTHER): Payer: Medicare Other | Admitting: Gastroenterology

## 2018-11-19 ENCOUNTER — Other Ambulatory Visit: Payer: Self-pay

## 2018-11-19 VITALS — BP 130/70 | HR 63 | Temp 98.4°F | Ht 67.0 in | Wt 293.0 lb

## 2018-11-19 DIAGNOSIS — Z800 Family history of malignant neoplasm of digestive organs: Secondary | ICD-10-CM

## 2018-11-19 DIAGNOSIS — Z7901 Long term (current) use of anticoagulants: Secondary | ICD-10-CM | POA: Diagnosis not present

## 2018-11-19 DIAGNOSIS — E109 Type 1 diabetes mellitus without complications: Secondary | ICD-10-CM | POA: Diagnosis not present

## 2018-11-19 DIAGNOSIS — Z9981 Dependence on supplemental oxygen: Secondary | ICD-10-CM | POA: Diagnosis not present

## 2018-11-19 NOTE — Progress Notes (Signed)
11/19/2018 [name] [id number] [birth year]   HISTORY OF PRESENT ILLNESS: This is a very pleasant 58 year old female who is new to our office.  She is here today to discuss a colonoscopy.  Her brother who is age 71 was just diagnosed within the last few weeks with colon cancer.  She has never had a colonoscopy in the past.  She says that due to her other health issues she has had enough other stuff to worry about.  She is on chronic oxygen due to her sarcoidosis.  She is insulin-dependent diabetic from longstanding steroid use for treatment of the sarcoidosis.  She is on Xarelto for history of DVTs and PEs.  She says that she did have to hold the Xarelto prior to her cholecystectomy in January of this year and did fine with that.  She denies any GI complaints including rectal bleeding.  She says she moves her bowels regularly on a daily basis.  No abdominal pain.   Past Medical History:  Diagnosis Date  . Allergic rhinitis   . ALLERGIC RHINITIS 10/26/2009  . Anxiety   . ANXIETY 08/14/2006  . B12 DEFICIENCY 08/25/2007  . Complication of anesthesia    pt has had difficulty following anesthesia with her knee in 2016-unable to care for herself afterward  . Confusion   . Depression    takes Lexapro daily  . Diabetes mellitus without complication ([place])    was on insulin but has been off since Nov 2015 and now only takes Metformin daily  . DYSPNEA 04/28/2009   with exertion  . Esophageal reflux    takes Nexium daily  . Fibromyalgia   . Headache    last migraine 2-1yr ago;takes Topamax daily  . History of shingles   . Hypertension    takes Coreg daily  . Insomnia    takes Nortriptyline nightly   . Joint pain   . Joint swelling   . Left knee pain   . Lichen planus   . Long-term memory loss   . Nocturia   . OSA (obstructive sleep apnea)    doesn't use CPAP;sleep study in epic from 2006  . Osteoarthritis   . Osteoarthritis   . Pneumonia    over 30 yrs ago  .  Protein calorie malnutrition ([place]   . Rheumatoid arthritis ([place]   . Sarcoidosis    Dr. ZLolita Patella . Short-term memory loss   . Shortness of breath   . TIA (transient ischemic attack)   . Unsteady gait   . Urinary urgency   . Vitamin D deficiency    is supposed to take Vit D but can't afford it  . VITAMIN D DEFICIENCY 08/25/2007   Past Surgical History:  Procedure Laterality Date  . APPENDECTOMY    . arthroscopic knee surgery Right 11-12-04  . AXILLARY ABCESS IRRIGATION AND DEBRIDEMENT  Jul & Aug2012  . CARPAL TUNNEL RELEASE Left 05/23/2014   Procedure: CARPAL TUNNEL RELEASE;  Surgeon: SMeredith Pel MD;  [facility]  Service: Orthopedics;  Laterality: Left;  . CHOLECYSTECTOMY N/A 02/11/2018   Procedure: LAPAROSCOPIC CHOLECYSTECTOMY WITH INTRAOPERATIVE CHOLANGIOGRAM ERAS PATHWAY;  Surgeon: TDonnie Mesa MD;  [facility]  Service: General;  Laterality: N/A;  . cyst removed from top of buttocks  at age 58 . ENDOMETRIAL ABLATION    . IR CHOLANGIOGRAM EXISTING TUBE  11/21/2017  . IR CHOLANGIOGRAM EXISTING TUBE  01/16/2018  . IR EXCHANGE BILIARY DRAIN  11/10/2017  . IR PATIENT  EVAL TECH 0-60 MINS  02/03/2018  . IR PERC CHOLECYSTOSTOMY  09/13/2017  . IR RADIOLOGIST EVAL & MGMT  10/08/2017  . LACRIMAL DUCT EXPLORATION Right 06/26/2017   Procedure: LACRIMAL DUCT EXPLORATION AND ETHMOIDECTOMY;  Surgeon: Clista Bernhardt, MD;  Location: [facility];  Service: Ophthalmology;  Laterality: Right;  . TEAR DUCT PROBING Right 06/26/2017   Procedure: TEAR DUCT PROBING WITH STENT;  Surgeon: Clista Bernhardt, MD;  Location: [facility];  Service: Ophthalmology;  Laterality: Right;  . TOTAL KNEE ARTHROPLASTY Right 11/15/2014   Procedure: TOTAL RIGHT KNEE ARTHROPLASTY;  Surgeon: Meredith Pel, MD;  Location: [facility];  Service: Orthopedics;  Laterality: Right;  . TOTAL KNEE ARTHROPLASTY Left 07/13/2015   Procedure: LEFT TOTAL KNEE ARTHROPLASTY;  Surgeon: Meredith Pel, MD;  Location: [facility];   Service: Orthopedics;  Laterality: Left;    reports that she has quit smoking. Her smoking use included cigarettes. She has a 5.00 pack-year smoking history. She has never used smokeless tobacco. She reports previous alcohol use of about 1.0 standard drinks of alcohol per week. She reports that she does not use drugs. family history includes Breast cancer in her sister and another family member; Cancer in her father; Colon cancer (age of onset: 22) in her brother; Coronary artery disease in some other family members; Heart attack in her brother; Heart disease in her mother; Heart failure in an other family member; Hypertension in an other family member; Kidney disease in her mother; Stroke in her brother. Allergies  Allergen Reactions  . Belsomra [Suvorexant] Other (See Comments)    [place] out of bed while asleep: "I'm waking up as I'm falling on the floor;" "Night terrors"  . Enalapril Maleate Cough  . Latex Hives  . Nickel Other (See Comments)    Blisters  Pt has a titanium right and left knee - nickel causes skin irritations that form into blisters and sores  . Other Other (See Comments)    Patient has Sarcoidosis and can't tolerate any metals  . Morphine And Related Itching and Nausea Only  . Doxycycline Diarrhea and Nausea And Vomiting  . Hydrochlorothiazide Other (See Comments)    Low potassium levels   . Hydroxychloroquine Sulfate Other (See Comments)    Vision changes   . Lyrica [Pregabalin] Other (See Comments)    "Made me feel high"  . Tape Other (See Comments)    Medical tape causes bruising      Outpatient Encounter Medications as of 11/19/2018  Medication Sig  . Adalimumab (HUMIRA PEN-PS/UV/ADOL HS START) 40 MG/0.8ML PNKT Inject 40 mg into the skin once a week.  [name] augmented betamethasone dipropionate (DIPROLENE-AF) 0.05 % ointment Apply topically 2 (two) times daily.  . B-D ULTRA-FINE 33 LANCETS MISC Use as instructed to test sugars once  daily  . Blood Glucose Monitoring  Suppl (ONETOUCH VERIO) w/Device KIT Use to check blood sugar once a day  . budesonide-formoterol (SYMBICORT) 160-4.5 MCG/ACT inhaler Inhale 2 puffs into the lungs TWICE DAILY 120/4=30  . carvedilol (COREG) 12.5 MG tablet Take 1 tablet (12.5 mg total) by mouth 2 (two) times daily with a meal.  . clonazePAM (KLONOPIN) 0.5 MG tablet Take 1 TO 2 tablets (0.5-1 mg total) by mouth at bedtime as needed for anxiety.  [name] dexlansoprazole (DEXILANT) 60 MG capsule Take 1 capsule (60 mg total) by mouth daily.  . Fremanezumab-vfrm (AJOVY) 225 MG/1.5ML SOAJ Inject 225 mg into the skin every 30 (thirty) days.  . furosemide (LASIX) 40 MG tablet  TAKE 1 TABLET BY MOUTH EVERY MORNING FOR EDEMA (Patient taking differently: Take 40 mg by mouth daily as needed for fluid or edema. )  . gabapentin (NEURONTIN) 300 MG capsule Take 1 capsule (300 mg total) by mouth 2 (two) times daily as needed (for pain). (Patient taking differently: Take 300 mg by mouth 2 (two) times daily. )  . glucose blood (ONETOUCH VERIO) test strip Use as instructed to check blood sugar once a day.  . hydrOXYzine (ATARAX/VISTARIL) 25 MG tablet Take 1 tablet (25 mg total) by mouth every 8 (eight) hours as needed for itching.  . metFORMIN (GLUCOPHAGE-XR) 500 MG 24 hr tablet TAKE 2 TABLETS(1000 MG) BY MOUTH DAILY WITH BREAKFAST  . mupirocin ointment (BACTROBAN) 2 % Use qid on nose sores  . OXYGEN Inhale into the lungs. Takes 2 liters every day (24/7)  . potassium chloride (K-DUR) 10 MEQ tablet Take 1 tablet (10 mEq total) by mouth daily.  [name] topiramate (TOPAMAX) 50 MG tablet Take 1 tablet (50 mg total) by mouth 2 (two) times daily.  [name] triamcinolone cream (KENALOG) 0.5 % Apply 1 application topically 3 (three) times daily.  [name] venlafaxine XR (EFFEXOR-XR) 75 MG 24 hr capsule Take 1 capsule (75 mg total) by mouth daily with breakfast.  . VENTOLIN HFA 108 (90 Base) MCG/ACT inhaler Inhale 2 puffs into the lungs every 6 (six) hours as needed for wheezing or  shortness of breath. 200/8=25 (Patient taking differently: Inhale 2 puffs into the lungs every 6 (six) hours as needed for wheezing or shortness of breath. )  . VOLTAREN 1 % GEL Apply 2 g topically 2 (two) times daily as needed (for pain). 100/4=25 (Patient taking differently: Apply 4 g topically 4 (four) times daily as needed (pain). )  . XARELTO 20 MG TABS tablet Take 1 tablet (20 mg total) by mouth at bedtime.   No facility-administered encounter medications on file as of 11/19/2018.      REVIEW OF SYSTEMS  : All other systems reviewed and negative except where noted in the History of Present Illness.   PHYSICAL EXAM: BP 130/70   Pulse 63   Temp 98.4 F (36.9 C)   Ht 5' 7"  (1.702 m)   Wt 293 lb (132.9 kg)   LMP 05/19/2003   BMI 45.89 kg/m  General: Well developed black female in no acute distress Head: Normocephalic and atraumatic Eyes:  Sclerae anicteric, conjunctiva pink. Ears: Normal auditory acuity Lungs: Clear throughout to auscultation; no increased WOB, Heart: Regular rate and rhythm; no M/R/G. Abdomen: Soft, non-distended.  BS present.  Non-tender. Rectal:  Will be done at the time of colonoscopy. Musculoskeletal: Symmetrical with no gross deformities  Skin: No lesions on visible extremities Extremities: No edema  Neurological: Alert oriented x 4, grossly non-focal Psychological:  Alert and cooperative. Normal mood and affect  ASSESSMENT AND PLAN: *Family history of colon cancer in her brother, just diagnosed at age 5:  Patient had never had colonoscopy in the past.  Will schedule with Dr. Hilarie Fredrickson at [facility] hospital due to O2 use. *Chronic O2 use due to sarcoidosis: Did inform of increased risk for procedure and sedation due to oxygen use. *IDDM:  Insulin will be adjusted prior to endoscopic procedure per protocol. Will resume normal dosing after procedure. *Chronic anticoagulation with Xarelto due to history of DVT and PE:  Will hold for 2 days prior to endoscopic  procedures - will instruct when and how to resume after procedure. Benefits and risks of procedure explained including  risks of bleeding, perforation, infection, missed lesions, reactions to medications and possible need for hospitalization and surgery for complications. Additional rare but real risk of stroke or other vascular clotting events off of Xarelto so explained and need to seek urgent help if any signs of these problems occur. Will communicate by phone or EMR with patient's prescribing provider, Dr. Alain Marion, to confirm that holding Xarelto is reasonable in this case.    CC:  Plotnikov, Evie Lacks, MD

## 2018-11-19 NOTE — Progress Notes (Signed)
Addendum: Reviewed and agree with assessment and management plan. Pyrtle, Jay M, MD  

## 2018-11-19 NOTE — Patient Instructions (Signed)
If you are age 58 or older, your body mass index should be between 23-30. Your Body mass index is 45.89 kg/m. If this is out of the aforementioned range listed, please consider follow up with your Primary Care Provider.  If you are age 89 or younger, your body mass index should be between 19-25. Your Body mass index is 45.89 kg/m. If this is out of the aformentioned range listed, please consider follow up with your Primary Care Provider.   We will contact you with a date for colonoscopy at [facility] with Dr. Hilarie Fredrickson when the schedule becomes available.  You will need to hold Xarelto 2 days prior to your procedure.  We will contact Dr. Alain Marion regarding clearance.  Thank you for choosing me and [facility].   Alonza Bogus, PA-C

## 2018-11-23 ENCOUNTER — Telehealth: Payer: Self-pay | Admitting: Sports Medicine

## 2018-11-23 NOTE — Telephone Encounter (Signed)
Called pt and scheduled her to get diabetic shoe measurements on 11.18.2020.    Pt then asked if it is normal to continue to still have foot pain on both feet after the injection (right foot is worse).She was seen on 11.3.2020

## 2018-11-23 NOTE — Telephone Encounter (Signed)
Yes let patient know that it takes time for the injection to work and that she should ice every night for 15-20 mins to help with her pain

## 2018-11-24 ENCOUNTER — Ambulatory Visit
Admission: RE | Admit: 2018-11-24 | Discharge: 2018-11-24 | Disposition: A | Discharge status: Home / Self Care | Payer: Medicare Other | Source: Ambulatory Visit | Attending: Neurology | Admitting: Neurology

## 2018-11-24 ENCOUNTER — Other Ambulatory Visit: Payer: Self-pay

## 2018-11-24 DIAGNOSIS — G441 Vascular headache, not elsewhere classified: Secondary | ICD-10-CM | POA: Diagnosis not present

## 2018-11-24 DIAGNOSIS — H539 Unspecified visual disturbance: Secondary | ICD-10-CM

## 2018-11-24 DIAGNOSIS — R519 Headache, unspecified: Secondary | ICD-10-CM | POA: Diagnosis not present

## 2018-11-24 DIAGNOSIS — R51 Headache with orthostatic component, not elsewhere classified: Secondary | ICD-10-CM

## 2018-11-24 MED ORDER — GADOBENATE DIMEGLUMINE 529 MG/ML IV SOLN
20.0000 mL | Freq: Once | INTRAVENOUS | Status: AC | PRN
  Administered 2018-11-24: 13:00:00 20 mL via INTRAVENOUS

## 2018-11-30 ENCOUNTER — Other Ambulatory Visit: Payer: Self-pay | Admitting: Internal Medicine

## 2018-12-02 ENCOUNTER — Other Ambulatory Visit: Payer: Self-pay

## 2018-12-02 ENCOUNTER — Ambulatory Visit: Payer: Medicare Other | Admitting: Orthotics

## 2018-12-09 ENCOUNTER — Telehealth: Payer: Self-pay

## 2018-12-09 ENCOUNTER — Other Ambulatory Visit: Payer: Self-pay

## 2018-12-09 DIAGNOSIS — Z800 Family history of malignant neoplasm of digestive organs: Secondary | ICD-10-CM

## 2018-12-09 DIAGNOSIS — Z7901 Long term (current) use of anticoagulants: Secondary | ICD-10-CM

## 2018-12-09 NOTE — Telephone Encounter (Signed)
[facility] [address] [place], [place]  [address] Phone:  [phone]   Fax:  [phone]   12/09/2018   RE:      [name] DOB:   [birth year] MRN:   [id number]   Dear Dr. Alain Marion,    We have scheduled the above patient for an endoscopic procedure. Our records show that she is on anticoagulation therapy.   Please advise as to whether the patient may come off her therapy of Xarelto two days prior to the colonoscopy procedure, which is scheduled for 01/19/19.  Please fax back/ or route to [place] [facility] at [phone].   Sincerely,    Thurmon Fair, RMA

## 2018-12-09 NOTE — Telephone Encounter (Signed)
Patient agrees to have procedure done 01/19/19  At [facility] at 10:30 am. Patient advised will await clearance regarding holding Xarelto from Dr. Alain Marion.  Covid screening on 12/31 at 8:30 am and pre-visit 12/23 at 10 am. Patent verbalized understanding.

## 2018-12-09 NOTE — Telephone Encounter (Signed)
-----   Message from Lowell Guitar, [place] sent at 11/19/2018 12:26 PM EST ----- Regarding: Colon at [facility] Dr Hilarie Fredrickson Patient needs colon at [facility] when an opening becomes available.  Pat on O2, Xarelto (Dr. Alain Marion) and Insulin.  Dottie if something comes open (cancellation) could you please let me know? Thanks Peter Congo

## 2018-12-15 ENCOUNTER — Telehealth: Payer: Self-pay | Admitting: Sports Medicine

## 2018-12-15 ENCOUNTER — Ambulatory Visit: Payer: Medicare Other | Admitting: Sports Medicine

## 2018-12-15 NOTE — Telephone Encounter (Signed)
Pt wanted to know if she could get a replacement cushion that Dr. Cannon Kettle gave to pt to keep big toe and 2nd toe seperated

## 2018-12-15 NOTE — Telephone Encounter (Signed)
Ok Thx 

## 2018-12-15 NOTE — Telephone Encounter (Signed)
I informed pt she could purchase a pad in our front-reception area from a sample board.

## 2018-12-16 NOTE — Telephone Encounter (Signed)
Spoke with patient this morning regarding holding her Xarelto. Per Dr. Alain Marion patient can hold two days prior to procedure.  Patient verbalized understanding.

## 2018-12-17 NOTE — Unmapped (Signed)
[facility] Refill Coordination Note    Specialty Medication(s) to be Shipped:   Inflammatory Disorders: Humira    Other medication(s) to be shipped: n/a     Carolyn Newton, DOB: [birth year]  Phone: [phone] (home)       All above HIPAA information was verified with patient.     Completed refill call assessment today to schedule patient's medication shipment from the [facility] [phone]).       Specialty medication(s) and dose(s) confirmed: Regimen is correct and unchanged.   Changes to medications: Carolyn Newton reports no changes at this time.  Changes to insurance: No  Questions for the pharmacist: No    Confirmed patient received Welcome Packet with first shipment. The patient will receive a drug information handout for each medication shipped and additional FDA Medication Guides as required.       DISEASE/MEDICATION-SPECIFIC INFORMATION        For patients on injectable medications: Patient currently has 1 doses left.  Next injection is scheduled for 12/23/2018.    SPECIALTY MEDICATION ADHERENCE     Medication Adherence    Patient reported X missed doses in the last month: 0  Specialty Medication: humira cf 40 mg/0.4 ml  Patient is on additional specialty medications: No  Any gaps in refill history greater than 2 weeks in the last 3 months: no  Demonstrates understanding of importance of adherence: yes  Informant: patient  Reliability of informant: reliable  Confirmed plan for next specialty medication refill: delivery by pharmacy  Refills needed for supportive medications: not needed                humira cf 40 mg/0.4 ml. 7 days on hand      SHIPPING     Shipping address confirmed in Epic.     Delivery Scheduled: Yes, Expected medication delivery date: 12/23/2018.     Medication will be delivered via UPS to the prescription address in Epic WAM.    Carolyn Newton D Verner Mccrone   [facility] Specialty Technician

## 2018-12-22 ENCOUNTER — Ambulatory Visit: Payer: Medicare Other | Admitting: Family Medicine

## 2018-12-22 MED FILL — HUMIRA PEN CITRATE FREE 40 MG/0.4 ML: 28 days supply | Qty: 4 | Fill #9 | Status: AC

## 2018-12-22 MED FILL — HUMIRA PEN CITRATE FREE 40 MG/0.4 ML: 28 days supply | Qty: 4 | Fill #9

## 2018-12-29 ENCOUNTER — Ambulatory Visit: Payer: Medicare Other | Admitting: Sports Medicine

## 2018-12-30 ENCOUNTER — Other Ambulatory Visit: Payer: Self-pay | Admitting: Internal Medicine

## 2019-01-06 ENCOUNTER — Ambulatory Visit (AMBULATORY_SURGERY_CENTER): Payer: Medicare Other | Admitting: *Deleted

## 2019-01-06 DIAGNOSIS — Z1159 Encounter for screening for other viral diseases: Secondary | ICD-10-CM

## 2019-01-06 DIAGNOSIS — Z800 Family history of malignant neoplasm of digestive organs: Secondary | ICD-10-CM

## 2019-01-06 MED ORDER — NA SULFATE-K SULFATE-MG SULF 17.5-3.13-1.6 GM/177ML PO SOLN: 1.0000 | Freq: Once | ORAL | 0 refills | Status: AC

## 2019-01-06 NOTE — Progress Notes (Signed)

## 2019-01-07 ENCOUNTER — Other Ambulatory Visit: Payer: Self-pay | Admitting: Internal Medicine

## 2019-01-12 ENCOUNTER — Telehealth: Payer: Self-pay | Admitting: Internal Medicine

## 2019-01-12 NOTE — Unmapped (Signed)
[facility] Refill Coordination Note    Specialty Medication(s) to be Shipped:   Inflammatory Disorders: Humira    Other medication(s) to be shipped: n/a     Carolyn Newton, DOB: 02-Dec-1960  Phone: [phone] (home)       All above HIPAA information was verified with patient.     Was a Nurse, learning disability used for this call? No    Completed refill call assessment today to schedule patient's medication shipment from the [facility] [phone]).       Specialty medication(s) and dose(s) confirmed: Regimen is correct and unchanged.   Changes to medications: Carolyn Newton reports no changes at this time.  Changes to insurance: No  Questions for the pharmacist: No    Confirmed patient received Welcome Packet with first shipment. The patient will receive a drug information handout for each medication shipped and additional FDA Medication Guides as required.       DISEASE/MEDICATION-SPECIFIC INFORMATION        For patients on injectable medications: Patient currently has 2 doses left.  Next injection is scheduled for 01/13/2019.    SPECIALTY MEDICATION ADHERENCE     Medication Adherence    Patient reported X missed doses in the last month: 0  Specialty Medication: Humira cf 40 mg/0.4 ml  Patient is on additional specialty medications: No  Any gaps in refill history greater than 2 weeks in the last 3 months: no  Demonstrates understanding of importance of adherence: yes  Informant: patient  Reliability of informant: reliable  Confirmed plan for next specialty medication refill: delivery by pharmacy  Refills needed for supportive medications: not needed                Humira cf 40 mg/0.4 ml. 14 days on hand      SHIPPING     Shipping address confirmed in Epic.     Delivery Scheduled: Yes, Expected medication delivery date: 01/20/2019.     Medication will be delivered via UPS to the prescription address in Epic WAM.    Carolyn Newton Carolyn Newton Carolyn Newton [facility] Specialty Technician

## 2019-01-12 NOTE — Telephone Encounter (Signed)
Patient called needs to reschedule procedure appt 01/19/19 at [facility] says she has no one to transport her.

## 2019-01-14 ENCOUNTER — Other Ambulatory Visit (HOSPITAL_COMMUNITY): Payer: Medicare Other

## 2019-01-18 ENCOUNTER — Other Ambulatory Visit: Payer: Self-pay | Admitting: Internal Medicine

## 2019-01-18 NOTE — Telephone Encounter (Signed)
Appt cancelled, pt aware that we will have to wait until next available hospital date to reschedule her appt.

## 2019-01-19 ENCOUNTER — Encounter (HOSPITAL_COMMUNITY): Admission: RE | Payer: Self-pay | Source: Home / Self Care

## 2019-01-19 ENCOUNTER — Ambulatory Visit (HOSPITAL_COMMUNITY): Admission: RE | Admit: 2019-01-19 | Payer: Medicare Other | Source: Home / Self Care | Admitting: Internal Medicine

## 2019-01-19 SURGERY — COLONOSCOPY WITH PROPOFOL
Anesthesia: Monitor Anesthesia Care

## 2019-01-19 MED FILL — HUMIRA PEN CITRATE FREE 40 MG/0.4 ML: 28 days supply | Qty: 4 | Fill #10 | Status: AC

## 2019-01-19 MED FILL — HUMIRA PEN CITRATE FREE 40 MG/0.4 ML: 28 days supply | Qty: 4 | Fill #10

## 2019-01-26 ENCOUNTER — Other Ambulatory Visit: Payer: Self-pay

## 2019-01-26 ENCOUNTER — Ambulatory Visit (INDEPENDENT_AMBULATORY_CARE_PROVIDER_SITE_OTHER): Payer: Medicare Other | Admitting: Sports Medicine

## 2019-01-26 ENCOUNTER — Encounter: Payer: Self-pay | Admitting: Sports Medicine

## 2019-01-26 ENCOUNTER — Ambulatory Visit: Payer: Medicare Other | Admitting: Internal Medicine

## 2019-01-26 DIAGNOSIS — M722 Plantar fascial fibromatosis: Secondary | ICD-10-CM

## 2019-01-26 DIAGNOSIS — E1142 Type 2 diabetes mellitus with diabetic polyneuropathy: Secondary | ICD-10-CM

## 2019-01-26 DIAGNOSIS — M79675 Pain in left toe(s): Secondary | ICD-10-CM

## 2019-01-26 DIAGNOSIS — M79674 Pain in right toe(s): Secondary | ICD-10-CM

## 2019-01-26 DIAGNOSIS — B351 Tinea unguium: Secondary | ICD-10-CM

## 2019-01-26 NOTE — Progress Notes (Signed)
Subjective: [name] is a 59 y.o. female patient with history of diabetes who presents to office today complaining of long,mildly painful nails  while ambulating in shoes; unable to trim. Patient states that the glucose reading this morning was not recorded. Patient denies any new changes in medication or new problems. Patient denies any new cramping, numbness, burning or tingling in the legs. Reports that injections last visit did not help foot pain and reports that she is still trying to get diabetic shoes.  No other issues noted.   Patient Active Problem List   Diagnosis Date Noted  . Chronic anticoagulation 11/19/2018  . Insulin dependent diabetes mellitus type IA ([place]) 11/19/2018  . Oxygen dependent 11/19/2018  . Family history of colon cancer 10/26/2018  . Myringotomy tube status 08/21/2018  . Vaginal candidiasis 07/15/2018  . Nasal crusting 06/12/2018  . Chronic serous otitis media of left ear 06/04/2018  . Mixed conductive and sensorineural hearing loss of left ear with restricted hearing of right ear 06/04/2018  . Chronic cholecystitis with calculus 02/11/2018  . Thrush, oral 01/15/2018  . Nasal sinus congestion 01/15/2018  . Dyslipidemia 10/28/2017  . Pruritus 10/23/2017  . Abnormal liver function test   . Anemia 09/24/2017  . Migraines 09/17/2017  . Cholecystitis, acute 09/12/2017  . COPD (chronic obstructive pulmonary disease) ([place]) 12/30/2016  . Diastolic dysfunction 03/88/8280  . OSA (obstructive sleep apnea) 09/30/2016  . Chronic respiratory failure with hypoxia ([place]) 08/23/2016  . Pulmonary embolism ([place]) 07/21/2016  . DVT (deep venous thrombosis) ([place]) 07/21/2016  . Weight gain 12/20/2015  . Controlled type 2 diabetes mellitus with chronic kidney disease, without long-term current use of insulin ([place]) 02/01/2015  . Abnormal SPEP 06/04/2013  . Memory loss 04/27/2013  . Panic attacks 03/17/2013  . Rash 10/27/2012  . Pulmonary nodules 07/20/2012  .  CAD in native artery 05/28/2012  . Seborrheic dermatitis 01/20/2012  . Obesity, Class III, BMI 40-49.9 (morbid obesity) ([place]) 03/07/2010  . INSOMNIA, CHRONIC 03/07/2010  . Allergic rhinitis 10/26/2009  . Essential hypertension 08/27/2007  . B12 deficiency 08/25/2007  . Vitamin D deficiency 08/25/2007  . SINUSITIS, ACUTE 02/02/2007  . Osteoarthritis 02/02/2007  . Sarcoidosis 08/14/2006  . Anxiety state 08/14/2006  . Major depressive disorder, recurrent episode ([place]) 08/14/2006  . GERD 08/14/2006   Current Outpatient Medications on File Prior to Visit  Medication Sig Dispense Refill  . Adalimumab (HUMIRA PEN-PS/UV/ADOL HS START) 40 MG/0.8ML PNKT Inject 40 mg into the skin once a week.    . B-D ULTRA-FINE 33 LANCETS MISC Use as instructed to test sugars once  daily 100 each 12  . Blood Glucose Monitoring Suppl (ONETOUCH VERIO) w/Device KIT Use to check blood sugar once a day 1 kit 0  . budesonide-formoterol (SYMBICORT) 160-4.5 MCG/ACT inhaler Inhale 2 puffs into the lungs TWICE DAILY 120/4=30 10.2 g 0  . carvedilol (COREG) 12.5 MG tablet Take 1 tablet (12.5 mg total) by mouth 2 (two) times daily with a meal. 180 tablet 3  . clonazePAM (KLONOPIN) 0.5 MG tablet Take 1 TO 2 tablets (0.5-1 mg total) by mouth at bedtime as needed for anxiety. (Patient taking differently: Take 0.5 mg by mouth at bedtime. ) 60 tablet 3  . DEXILANT 60 MG capsule Take 1 capsule (60 mg total) by mouth daily. 90 capsule 2  . diclofenac Sodium (VOLTAREN) 1 % GEL Apply 2 g topically 2 (two) times daily as needed (for pain). 100/4=25    . furosemide (LASIX) 40 MG tablet TAKE 1  TABLET BY MOUTH EVERY MORNING FOR EDEMA (Patient taking differently: Take 40 mg by mouth daily as needed for fluid or edema. ) 90 tablet 1  . gabapentin (NEURONTIN) 300 MG capsule Take 1 capsule (300 mg total) by mouth 2 (two) times daily as needed (for pain). (Patient taking differently: Take 300 mg by mouth 2 (two) times daily. ) 180 capsule 1  .  glucose blood (ONETOUCH VERIO) test strip Use as instructed to check blood sugar once a day. 100 each 12  . hydrOXYzine (ATARAX/VISTARIL) 25 MG tablet Take 1 tablet (25 mg total) by mouth every 8 (eight) hours as needed for itching. 60 tablet 1  . metFORMIN (GLUCOPHAGE-XR) 500 MG 24 hr tablet TAKE 2 TABLETS(1000 MG) BY MOUTH DAILY WITH BREAKFAST (Patient taking differently: Take 1,000 mg by mouth at bedtime. TAKE 2 TABLETS(1000 MG) BY MOUTH DAILY) 180 tablet 2  . naltrexone (DEPADE) 50 MG tablet Split tablets into quarters. Take one-quarter of a tablet once daily    . OXYGEN Inhale into the lungs. Takes 2 liters every day (24/7)    . potassium chloride (KLOR-CON) 10 MEQ tablet Take 1 tablet (10 mEq total) by mouth daily. 90 tablet 0  . SUPREP BOWEL PREP KIT 17.5-3.13-1.6 GM/177ML SOLN SMARTSIG:1 Kit(s) By Mouth Once    . topiramate (TOPAMAX) 50 MG tablet Take 1 tablet (50 mg total) by mouth 2 (two) times daily. 180 tablet 3  . venlafaxine XR (EFFEXOR-XR) 75 MG 24 hr capsule Take 1 capsule (75 mg total) by mouth daily with breakfast. 30 capsule 11  . VENTOLIN HFA 108 (90 Base) MCG/ACT inhaler Inhale 2 puffs into the lungs every 6 (six) hours as needed for wheezing or shortness of breath. 200/8=25 (Patient taking differently: Inhale 2 puffs into the lungs every 6 (six) hours as needed for wheezing or shortness of breath. ) 18 g 11  . XARELTO 20 MG TABS tablet Take 1 tablet (20 mg total) by mouth at bedtime. 90 tablet 3   No current facility-administered medications on file prior to visit.   Allergies  Allergen Reactions  . Belsomra [Suvorexant] Other (See Comments)    [place] out of bed while asleep: "I'm waking up as I'm falling on the floor;" "Night terrors"  . Enalapril Maleate Cough  . Latex Hives  . Nickel Other (See Comments)    Blisters  Pt has a titanium right and left knee - nickel causes skin irritations that form into blisters and sores  . Other Other (See Comments)    Patient has  Sarcoidosis and can't tolerate any metals  . Morphine And Related Itching and Nausea Only  . Doxycycline Diarrhea and Nausea And Vomiting  . Hydrochlorothiazide Other (See Comments)    Low potassium levels   . Hydroxychloroquine Sulfate Other (See Comments)    Vision changes   . Lyrica [Pregabalin] Other (See Comments)    "Made me feel high"  . Tape Other (See Comments)    Medical tape causes bruising    Recent Results (from the past 2160 hour(s))  POCT glycosylated hemoglobin (Hb A1C)     Status: Abnormal   Collection Time: 11/03/18  1:59 PM  Result Value Ref Range   Hemoglobin A1C 5.7 (A) 4.0 - 5.6 %   HbA1c POC (<> result, manual entry)     HbA1c, POC (prediabetic range)     HbA1c, POC (controlled diabetic range)      Objective: General: Patient is awake, alert, and oriented x 3 and in no  acute distress.  Integument: Skin is warm, dry and supple bilateral. Nails are tender, long, thickened and dystrophic with subungual debris, consistent with onychomycosis, 1-5 bilateral. No signs of infection. No open lesions or preulcerative lesions present bilateral. Small hyperpigmented patches bilateral, hx of lichen planus. Remaining integument unremarkable.  Vasculature:  Dorsalis Pedis pulse 1/4 bilateral. Posterior Tibial pulse  1/4 bilateral.  Capillary fill time <3 sec 1-5 bilateral. Positive hair growth to the level of the digits. Temperature gradient within normal limits. No varicosities present bilateral. No edema present bilateral.   Neurology: The patient has diminished sensation measured with a 5.07/10g Semmes Weinstein Monofilament at all pedal sites bilateral . Vibratory sensation diminished bilateral with tuning fork. No Babinski sign present bilateral.   Musculoskeletal: Asymptomatic pes planus pedal deformities noted bilateral. Chronic heel pain bilateral. Muscular strength 5/5 in all lower extremity muscular groups bilateral without pain on range of motion. No tenderness  with calf compression bilateral.  Assessment and Plan: Problem List Items Addressed This Visit    None    Visit Diagnoses    Pain due to onychomycosis of toenails of both feet    -  Primary   Diabetic polyneuropathy associated with type 2 diabetes mellitus (HCC)       Plantar fasciitis, bilateral         -Examined patient. -Discussed and educated patient on diabetic foot care, especially with  regards to the vascular, neurological and musculoskeletal systems.  -Stressed the importance of good glycemic control and the detriment of not  controlling glucose levels in relation to the foot. -Mechanically debrided all nails 1-5 bilateral using sterile nail nipper and filed with dremel without incident  -Awaiting diabetic shoes  -Answered all patient questions -Patient to return  in 3 months for at risk foot care -Patient advised to call the office if any problems or questions arise in the meantime.  Landis Martins, DPM

## 2019-01-27 ENCOUNTER — Telehealth: Payer: Self-pay | Admitting: Internal Medicine

## 2019-01-27 NOTE — Telephone Encounter (Signed)
Scheduler left message with transportation to confirm 1/18 appointment   Copied from [place] [phone]. Topic: General - Inquiry >> Jan 27, 2019 11:53 AM Scherrie Gerlach wrote: Reason for CRM: pt's medicaid transportation called called was told by Danton Clap that this office doesn't not accept Medicaid.  (they only called because of the new location) pt has been seeing Dr Mignon Pine 15 yrs, and [facility] told pt they can not give her a ride anymore, because this office does not accept Medicaid. [id number]  Benita Blue I did call Benita and LM we do accept the pt's Medicaid and  she should be able to have her continued rides and asked her to cb with any questions

## 2019-01-28 ENCOUNTER — Encounter: Payer: Self-pay | Admitting: Adult Health

## 2019-01-28 ENCOUNTER — Other Ambulatory Visit: Payer: Self-pay

## 2019-01-28 ENCOUNTER — Ambulatory Visit (INDEPENDENT_AMBULATORY_CARE_PROVIDER_SITE_OTHER): Payer: Medicare Other | Admitting: Adult Health

## 2019-01-28 DIAGNOSIS — J9611 Chronic respiratory failure with hypoxia: Secondary | ICD-10-CM

## 2019-01-28 DIAGNOSIS — J449 Chronic obstructive pulmonary disease, unspecified: Secondary | ICD-10-CM

## 2019-01-28 DIAGNOSIS — R5381 Other malaise: Secondary | ICD-10-CM | POA: Diagnosis not present

## 2019-01-28 DIAGNOSIS — D869 Sarcoidosis, unspecified: Secondary | ICD-10-CM

## 2019-01-28 NOTE — Assessment & Plan Note (Signed)
Stable on Symbicort  Refills sent   Plan  Patient Instructions  Continue on Symbicort Twice daily  . Rinse after use.  Mucinex DM Twice daily  As needed  Cough/congestion  Continue on Oxygen 2l/m .  Activity as tolerated.  Continue on Xarelto .  Work on healthy weight loss.  Follow up Dr. Elsworth Soho in 4  months and As needed   Please contact office for sooner follow up if symptoms do not improve or worsen or seek emergency care

## 2019-01-28 NOTE — Progress Notes (Signed)
_0  ID: [name], female    DOB: 09/30/1960, 59 y.o.   MRN: [id number]  Chief Complaint  Patient presents with  . 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  Previously on prednisone but tapered off.  Admitted 07/21/2016 for RLE DVT-had stoppedXarelto 04/2016 for prior pulmonary embolism  CT scan = chronic appearing subsegmental PE ,on Xarelto lifelong  PFT 05/2014 >> FVC 65%, ratio 69, FEV1 58%, DLCO 62%  VQ Scan 08/17/15 >>LLL &RUL acute PE   ECHO 08/18/15 >>mild LVH , EF 55-60%, Gr 1 DD .   LE Venous Doppler 08/18/15 >>neg DVT , no bakers cyst, ?prominent inguinal lymph node measuring 3.1cm   MRI brain neg [facility] = MRI spine = mild to moderate disc protrusion/left-sided spinal stenosis-chronic pain  NPSG 12/2004 mild, RDI 13/h  SARS-CoV-2 July 23, 2018- negative  01/28/2019 Follow up : Sarcoid , PE  Patient presents for a 37-monthfollow-up.  Patient has underlying sarcoidosis with pulmonary and cutaneous involvement.  She has a history of recurrent PE on lifelong anticoagulation with Xarelto.  Patient says since last visit she is doing about the same.  She gets winded with minimal activity.  She is on oxygen 2 L.  She is somewhat sedentary.  She denies any increased cough or wheezing.  Says that her gait has not been as steady, prone to falling at home.   She remains on Symbicort twice daily. Last CT chest July 2020 showed stable sarcoid to mildly decreased.(Nodular opacities (stable mediastinal and bilateral hilar adenopathy.  PFTs done in July 2020 showed stable lung  function with FEV1 at 70%, ratio 81, FVC 67% and DLCO was 61%.  This was similar to 2016. Feels she has gotten weaker with pandemic and is not active so muscle are weaker.  Neighbor smokes cigarettes and marajuana. This bothers her and aggravates her breathing. She is very angry about this .  She denies any increased albuterol use.  2018 started on Humira for reoccurring cutaneous lesions not responding to methotrexate.  Previously on prednisone but tapered off. Feels that Humira helps her joint pain as well. No known issues with Xarelto .   Allergies  Allergen Reactions  . Belsomra [Suvorexant] Other (See Comments)    FGolden Circleout of bed while asleep: "I'm waking up as I'm falling on the floor;" "Night terrors"  . Enalapril Maleate Cough  . Latex Hives  . Nickel Other (See Comments)    Blisters  Pt has a titanium right and left knee - nickel causes skin irritations that form into blisters and sores  . Other Other (See Comments)    Patient has Sarcoidosis and can't tolerate any metals  . Morphine And Related Itching and Nausea Only  . Doxycycline Diarrhea and Nausea And Vomiting  . Hydrochlorothiazide Other (See Comments)    Low potassium levels   . Hydroxychloroquine Sulfate Other (See Comments)    Vision changes   . Lyrica [Pregabalin] Other (See Comments)    "Made me feel high"  . Tape Other (See Comments)    Medical tape causes bruising    Immunization History  Administered Date(s) Administered  . H1N1  12/24/2007  . Influenza Split 10/11/2010  . Influenza Whole 10/14/2007, 10/05/2008, 10/26/2009, 11/13/2011  . Influenza,inj,Quad PF,6+ Mos 09/21/2012, 09/24/2013, 10/06/2014, 09/05/2015, 11/06/2016, 09/24/2017, 10/07/2018  . PPD Test 11/18/2014  . Pneumococcal Conjugate-13 07/30/2010, 12/29/2012  . Pneumococcal Polysaccharide-23 12/23/2013  . Tdap 06/28/2014    Past Medical History:  Diagnosis Date  . Allergic rhinitis   . ALLERGIC RHINITIS 10/26/2009  . Anxiety   .  ANXIETY 08/14/2006  . B12 DEFICIENCY 08/25/2007  . Complication of anesthesia    pt has had difficulty following anesthesia with her knee in 2016-unable to care for herself afterward  . Confusion   . Depression    takes Lexapro daily  . Diabetes mellitus without complication ([place])    was on insulin but has been off since Nov 2015 and now only takes Metformin daily  . DYSPNEA 04/28/2009   with exertion  . Esophageal reflux    takes Nexium daily  . Fibromyalgia   . Headache    last migraine 2-31yr ago;takes Topamax daily  . History of shingles   . Hypertension    takes Coreg daily  . Insomnia    takes Nortriptyline nightly   . Joint pain   . Joint swelling   . Left knee pain   . Lichen planus   . Long-term memory loss   . Nocturia   . OSA (obstructive sleep apnea)    doesn't use CPAP;sleep study in epic from 2006  . Osteoarthritis   . Osteoarthritis   . Pneumonia    over 30 yrs ago  . Protein calorie malnutrition ([place]   . Rheumatoid arthritis ([place]   . Sarcoidosis    Dr. ZLolita Patella . Short-term memory loss   . Shortness of breath   . Sleep apnea     wears oxygen  . TIA (transient ischemic attack)   . Unsteady gait   . Urinary urgency   . Vitamin D deficiency    is supposed to take Vit D but can't afford it  . VITAMIN D DEFICIENCY 08/25/2007    Tobacco History: Social History   Tobacco Use  Smoking Status Former Smoker  . Packs/day: 0.50  . Years: 10.00  . Pack years: 5.00  . Types: Cigarettes  Smokeless Tobacco Never Used  Tobacco Comment   quit smoking in 2004   Counseling given: Not Answered Comment: quit smoking in 2004   Outpatient Medications Prior to Visit  Medication Sig Dispense Refill  . Adalimumab (HUMIRA PEN-PS/UV/ADOL HS START) 40 MG/0.8ML PNKT Inject 40 mg into the skin once a week.    . B-D ULTRA-FINE 33 LANCETS MISC Use as instructed to test sugars once  daily 100 each 12  . Blood Glucose Monitoring Suppl (ONETOUCH VERIO) w/Device KIT Use  to check blood sugar once a day 1 kit 0  . budesonide-formoterol (SYMBICORT) 160-4.5 MCG/ACT inhaler Inhale 2 puffs into the lungs TWICE DAILY 120/4=30 10.2 g 0  . carvedilol (COREG) 12.5 MG tablet Take 1 tablet (12.5 mg total) by mouth 2 (two) times daily with a meal. 180 tablet 3  . clonazePAM (KLONOPIN) 0.5 MG tablet Take 1 TO 2 tablets (0.5-1 mg total) by mouth at bedtime as needed for anxiety. (Patient taking differently: Take 0.5 mg by mouth at bedtime. ) 60 tablet 3  . DEXILANT 60 MG capsule Take 1 capsule (60 mg total) by mouth daily. 90 capsule 2  . diclofenac Sodium (VOLTAREN) 1 % GEL Apply 2 g topically 2 (two) times daily as needed (  for pain). 100/4=25    . furosemide (LASIX) 40 MG tablet TAKE 1 TABLET BY MOUTH EVERY MORNING FOR EDEMA (Patient taking differently: Take 40 mg by mouth daily as needed for fluid or edema. ) 90 tablet 1  . gabapentin (NEURONTIN) 300 MG capsule Take 1 capsule (300 mg total) by mouth 2 (two) times daily as needed (for pain). (Patient taking differently: Take 300 mg by mouth 2 (two) times daily. ) 180 capsule 1  . glucose blood (ONETOUCH VERIO) test strip Use as instructed to check blood sugar once a day. 100 each 12  . hydrOXYzine (ATARAX/VISTARIL) 25 MG tablet Take 1 tablet (25 mg total) by mouth every 8 (eight) hours as needed for itching. 60 tablet 1  . metFORMIN (GLUCOPHAGE-XR) 500 MG 24 hr tablet TAKE 2 TABLETS(1000 MG) BY MOUTH DAILY WITH BREAKFAST (Patient taking differently: Take 1,000 mg by mouth at bedtime. TAKE 2 TABLETS(1000 MG) BY MOUTH DAILY) 180 tablet 2  . naltrexone (DEPADE) 50 MG tablet Split tablets into quarters. Take one-quarter of a tablet once daily    . OXYGEN Inhale into the lungs. Takes 2 liters every day (24/7)    . potassium chloride (KLOR-CON) 10 MEQ tablet Take 1 tablet (10 mEq total) by mouth daily. 90 tablet 0  . SUPREP BOWEL PREP KIT 17.5-3.13-1.6 GM/177ML SOLN SMARTSIG:1 Kit(s) By Mouth Once    . topiramate (TOPAMAX) 50 MG tablet  Take 1 tablet (50 mg total) by mouth 2 (two) times daily. 180 tablet 3  . venlafaxine XR (EFFEXOR-XR) 75 MG 24 hr capsule Take 1 capsule (75 mg total) by mouth daily with breakfast. 30 capsule 11  . VENTOLIN HFA 108 (90 Base) MCG/ACT inhaler Inhale 2 puffs into the lungs every 6 (six) hours as needed for wheezing or shortness of breath. 200/8=25 (Patient taking differently: Inhale 2 puffs into the lungs every 6 (six) hours as needed for wheezing or shortness of breath. ) 18 g 11  . XARELTO 20 MG TABS tablet Take 1 tablet (20 mg total) by mouth at bedtime. 90 tablet 3   No facility-administered medications prior to visit.     Review of Systems:   Constitutional:   No  weight loss, night sweats,  Fevers, chills, f +atigue, or  lassitude.  HEENT:   No headaches,  Difficulty swallowing,  Tooth/dental problems, or  Sore throat,                No sneezing, itching, ear ache, nasal congestion, post nasal drip,   CV:  No chest pain,  Orthopnea, PND, +swelling in lower extremities,  No anasarca, dizziness, palpitations, syncope.   GI  No heartburn, indigestion, abdominal pain, nausea, vomiting, diarrhea, change in bowel habits, loss of appetite, bloody stools.   Resp:    No chest wall deformity  Skin: no rash or lesions.  GU: no dysuria, change in color of urine, no urgency or frequency.  No flank pain, no hematuria   MS:  No joint pain or swelling.  No decreased range of motion.  No back pain.    Physical Exam  BP 128/74 (BP Location: Right Arm, Cuff Size: Large)   Pulse 73   Temp (!) 97.1 F (36.2 C) (Temporal)   LMP 05/19/2003   SpO2 97% Comment: 2L pulsed  GEN: A/Ox3; pleasant , NAD,Rolling  Walker    HEENT:  [place]/AT,    NOSE-clear, THROAT-clear, no lesions, no postnasal drip or exudate noted.   NECK:  Supple w/ fair ROM; no  JVD; normal carotid impulses w/o bruits; no thyromegaly or nodules palpated; no lymphadenopathy.    RESP  Clear  P & A; w/o, wheezes/ rales/ or rhonchi.  no accessory muscle use, no dullness to percussion  CARD:  RRR, no m/r/g, tr  peripheral edema, pulses intact, no cyanosis or clubbing.  GI:   Soft & nt; nml bowel sounds; no organomegaly or masses detected.   Musco: Warm bil, no deformities or joint swelling noted.   Neuro: alert, no focal deficits noted.    Skin: Warm, no lesions or rashes    Lab Results:  CBC   BNP Imaging: No results found.    PFT Results Latest Ref Rng & Units 07/27/2018 12/30/2016 05/18/2014  FVC-Pre L 2.51 2.07 2.29  FVC-Predicted Pre % 67 54 61  FVC-Post L 2.51 2.18 2.43  FVC-Predicted Post % 67 57 65  Pre FEV1/FVC % % 79 70 69  Post FEV1/FCV % % 81 68 70  FEV1-Pre L 1.99 1.46 1.58  FEV1-Predicted Pre % 68 49 54  FEV1-Post L 2.03 1.49 1.70  DLCO UNC% % 61 41 62  DLCO COR %Predicted % 84 66 91  TLC L 4.27 4.07 -  TLC % Predicted % 77 74 -  RV % Predicted % 84 92 -    No results found for: NITRICOXIDE      Assessment & Plan:   COPD (chronic obstructive pulmonary disease) (HCC) Stable on Symbicort  Refills sent   Plan  Patient Instructions  Continue on Symbicort Twice daily  . Rinse after use.  Mucinex DM Twice daily  As needed  Cough/congestion  Continue on Oxygen 2l/m .  Activity as tolerated.  Continue on Xarelto .  Work on healthy weight loss.  Follow up Dr. Elsworth Soho in 4  months and As needed   Please contact office for sooner follow up if symptoms do not improve or worsen or seek emergency care       Chronic respiratory failure with hypoxia ([place]) Stable on 2 l/m  O2 sats goal >88-90%  Sarcoidosis Appears stable  Plan  Patient Instructions  Continue on Symbicort Twice daily  . Rinse after use.  Mucinex DM Twice daily  As needed  Cough/congestion  Continue on Oxygen 2l/m .  Activity as tolerated.  Continue on Xarelto .  Work on healthy weight loss.  Follow up Dr. Elsworth Soho in 4  months and As needed   Please contact office for sooner follow up if symptoms do not improve  or worsen or seek emergency care      Obesity, Class III, BMI 40-49.9 (morbid obesity) ([place]) Healthy weight loss.   Physical deconditioning Activity as tolerated Rolling walker  Discuss with PCT if PT might be beneficial    Total patient care time 31 min    , NP 01/28/2019

## 2019-01-28 NOTE — Assessment & Plan Note (Signed)
Stable on 2 l/m  O2 sats goal >88-90%

## 2019-01-28 NOTE — Assessment & Plan Note (Signed)
Appears stable  Plan  Patient Instructions  Continue on Symbicort Twice daily  . Rinse after use.  Mucinex DM Twice daily  As needed  Cough/congestion  Continue on Oxygen 2l/m .  Activity as tolerated.  Continue on Xarelto .  Work on healthy weight loss.  Follow up Dr. Elsworth Soho in 4  months and As needed   Please contact office for sooner follow up if symptoms do not improve or worsen or seek emergency care

## 2019-01-28 NOTE — Assessment & Plan Note (Signed)
Healthy weight loss 

## 2019-01-28 NOTE — Patient Instructions (Signed)
Continue on Symbicort Twice daily  . Rinse after use.  Mucinex DM Twice daily  As needed  Cough/congestion  Continue on Oxygen 2l/m .  Activity as tolerated.  Continue on Xarelto .  Work on healthy weight loss.  Follow up Dr. Elsworth Soho in 4  months and As needed   Please contact office for sooner follow up if symptoms do not improve or worsen or seek emergency care

## 2019-01-28 NOTE — Assessment & Plan Note (Signed)
Activity as tolerated Rolling walker  Discuss with PCT if PT might be beneficial

## 2019-02-01 ENCOUNTER — Ambulatory Visit (INDEPENDENT_AMBULATORY_CARE_PROVIDER_SITE_OTHER): Payer: Medicare Other | Admitting: Internal Medicine

## 2019-02-01 ENCOUNTER — Other Ambulatory Visit: Payer: Self-pay

## 2019-02-01 ENCOUNTER — Encounter: Payer: Self-pay | Admitting: Internal Medicine

## 2019-02-01 DIAGNOSIS — M25511 Pain in right shoulder: Secondary | ICD-10-CM

## 2019-02-01 DIAGNOSIS — N182 Chronic kidney disease, stage 2 (mild): Secondary | ICD-10-CM

## 2019-02-01 DIAGNOSIS — J9611 Chronic respiratory failure with hypoxia: Secondary | ICD-10-CM

## 2019-02-01 DIAGNOSIS — E559 Vitamin D deficiency, unspecified: Secondary | ICD-10-CM | POA: Diagnosis not present

## 2019-02-01 DIAGNOSIS — E538 Deficiency of other specified B group vitamins: Secondary | ICD-10-CM | POA: Diagnosis not present

## 2019-02-01 DIAGNOSIS — M25512 Pain in left shoulder: Secondary | ICD-10-CM

## 2019-02-01 DIAGNOSIS — I2782 Chronic pulmonary embolism: Secondary | ICD-10-CM | POA: Diagnosis not present

## 2019-02-01 DIAGNOSIS — E1122 Type 2 diabetes mellitus with diabetic chronic kidney disease: Secondary | ICD-10-CM

## 2019-02-01 DIAGNOSIS — G8929 Other chronic pain: Secondary | ICD-10-CM

## 2019-02-01 MED ORDER — CYANOCOBALAMIN 1000 MCG/ML IJ SOLN
1000.0000 ug | Freq: Once | INTRAMUSCULAR | Status: AC
  Administered 2019-02-01: 1000 ug via INTRAMUSCULAR

## 2019-02-01 NOTE — Assessment & Plan Note (Signed)
Worse -  L>R  Will ref to Dr Marlou Sa

## 2019-02-01 NOTE — Assessment & Plan Note (Signed)
Xarelto

## 2019-02-01 NOTE — Assessment & Plan Note (Signed)
Vit D 

## 2019-02-01 NOTE — Assessment & Plan Note (Signed)
Vit B 12 inj.

## 2019-02-01 NOTE — Progress Notes (Signed)
Subjective:  Patient ID: [name], female    DOB: Aug 18, 1960  Age: 59 y.o. MRN: [id number]  CC: No chief complaint on file.   HPI [name] [facility] presents for chronic pain I'm not able to bathe, comb my hair etc. [name] has an aid - hrs were cut back - "Now I need more hrs". Now getting 57h/month... She fell several times... C/o financial difficulties    Outpatient Medications Prior to Visit  Medication Sig Dispense Refill  . Adalimumab (HUMIRA PEN-PS/UV/ADOL HS START) 40 MG/0.8ML PNKT Inject 40 mg into the skin once a week.    . B-D ULTRA-FINE 33 LANCETS MISC Use as instructed to test sugars once  daily 100 each 12  . Blood Glucose Monitoring Suppl (ONETOUCH VERIO) w/Device KIT Use to check blood sugar once a day 1 kit 0  . budesonide-formoterol (SYMBICORT) 160-4.5 MCG/ACT inhaler Inhale 2 puffs into the lungs TWICE DAILY 120/4=30 10.2 g 0  . carvedilol (COREG) 12.5 MG tablet Take 1 tablet (12.5 mg total) by mouth 2 (two) times daily with a meal. 180 tablet 3  . clonazePAM (KLONOPIN) 0.5 MG tablet Take 1 TO 2 tablets (0.5-1 mg total) by mouth at bedtime as needed for anxiety. (Patient taking differently: Take 0.5 mg by mouth at bedtime. ) 60 tablet 3  . DEXILANT 60 MG capsule Take 1 capsule (60 mg total) by mouth daily. 90 capsule 2  . diclofenac Sodium (VOLTAREN) 1 % GEL Apply 2 g topically 2 (two) times daily as needed (for pain). 100/4=25    . furosemide (LASIX) 40 MG tablet TAKE 1 TABLET BY MOUTH EVERY MORNING FOR EDEMA (Patient taking differently: Take 40 mg by mouth daily as needed for fluid or edema. ) 90 tablet 1  . gabapentin (NEURONTIN) 300 MG capsule Take 1 capsule (300 mg total) by mouth 2 (two) times daily as needed (for pain). (Patient taking differently: Take 300 mg by mouth 2 (two) times daily. ) 180 capsule 1  . glucose blood (ONETOUCH VERIO) test strip Use as instructed to check blood sugar once a day. 100 each 12  . hydrOXYzine  (ATARAX/VISTARIL) 25 MG tablet Take 1 tablet (25 mg total) by mouth every 8 (eight) hours as needed for itching. 60 tablet 1  . metFORMIN (GLUCOPHAGE-XR) 500 MG 24 hr tablet TAKE 2 TABLETS(1000 MG) BY MOUTH DAILY WITH BREAKFAST (Patient taking differently: Take 1,000 mg by mouth at bedtime. TAKE 2 TABLETS(1000 MG) BY MOUTH DAILY) 180 tablet 2  . naltrexone (DEPADE) 50 MG tablet Split tablets into quarters. Take one-quarter of a tablet once daily    . OXYGEN Inhale into the lungs. Takes 2 liters every day (24/7)    . potassium chloride (KLOR-CON) 10 MEQ tablet Take 1 tablet (10 mEq total) by mouth daily. 90 tablet 0  . SUPREP BOWEL PREP KIT 17.5-3.13-1.6 GM/177ML SOLN SMARTSIG:1 Kit(s) By Mouth Once    . topiramate (TOPAMAX) 50 MG tablet Take 1 tablet (50 mg total) by mouth 2 (two) times daily. 180 tablet 3  . venlafaxine XR (EFFEXOR-XR) 75 MG 24 hr capsule Take 1 capsule (75 mg total) by mouth daily with breakfast. 30 capsule 11  . VENTOLIN HFA 108 (90 Base) MCG/ACT inhaler Inhale 2 puffs into the lungs every 6 (six) hours as needed for wheezing or shortness of breath. 200/8=25 (Patient taking differently: Inhale 2 puffs into the lungs every 6 (six) hours as needed for wheezing or shortness of breath. ) 18 g 11  . Alveda Reasons  20 MG TABS tablet Take 1 tablet (20 mg total) by mouth at bedtime. 90 tablet 3   No facility-administered medications prior to visit.    ROS: Review of Systems  Constitutional: Positive for fatigue. Negative for activity change, appetite change, chills and unexpected weight change.  HENT: Negative for congestion, mouth sores and sinus pressure.   Eyes: Negative for visual disturbance.  Respiratory: Negative for cough and chest tightness.   Gastrointestinal: Negative for abdominal pain and nausea.  Genitourinary: Negative for difficulty urinating, frequency and vaginal pain.  Musculoskeletal: Positive for arthralgias, back pain, gait problem, myalgias, neck pain and neck  stiffness.  Skin: Negative for pallor and rash.  Neurological: Positive for dizziness and weakness. Negative for tremors, numbness and headaches.  Psychiatric/Behavioral: Positive for decreased concentration and dysphoric mood. Negative for confusion, sleep disturbance and suicidal ideas. The patient is nervous/anxious.     Objective:  Pulse 67   Temp 98.2 F (36.8 C) (Oral)   Ht 5' 7"  (1.702 m)   Wt 293 lb (132.9 kg)   LMP 05/19/2003   SpO2 96%   BMI 45.89 kg/m   BP Readings from Last 3 Encounters:  01/28/19 128/74  11/19/18 130/70  11/17/18 132/77    Wt Readings from Last 3 Encounters:  02/01/19 293 lb (132.9 kg)  11/19/18 293 lb (132.9 kg)  11/03/18 292 lb (132.5 kg)    Physical Exam Constitutional:      General: She is not in acute distress.    Appearance: She is well-developed. She is obese.  HENT:     Head: Normocephalic.     Right Ear: External ear normal.     Left Ear: External ear normal.     Nose: Nose normal.  Eyes:     General:        Right eye: No discharge.        Left eye: No discharge.     Conjunctiva/sclera: Conjunctivae normal.     Pupils: Pupils are equal, round, and reactive to light.  Neck:     Thyroid: No thyromegaly.     Vascular: No JVD.     Trachea: No tracheal deviation.  Cardiovascular:     Rate and Rhythm: Normal rate and regular rhythm.     Heart sounds: Normal heart sounds.  Pulmonary:     Effort: No respiratory distress.     Breath sounds: No stridor. No wheezing.  Abdominal:     General: Bowel sounds are normal. There is no distension.     Palpations: Abdomen is soft. There is no mass.     Tenderness: There is no abdominal tenderness. There is no guarding or rebound.  Musculoskeletal:        General: Tenderness present.     Cervical back: Normal range of motion and neck supple.  Lymphadenopathy:     Cervical: No cervical adenopathy.  Skin:    Findings: No erythema or rash.  Neurological:     Cranial Nerves: No cranial  nerve deficit.     Motor: No abnormal muscle tone.     Coordination: Coordination abnormal.     Deep Tendon Reflexes: Reflexes normal.  Psychiatric:        Behavior: Behavior normal.        Thought Content: Thought content normal.        Judgment: Judgment normal.   On O2  walker B shoulders - very tender B L>R  Time 45 min including recore review and PCS form filling out time  Lab Results  Component Value Date   WBC 6.2 04/07/2018   HGB 11.5 (L) 04/07/2018   HCT 35.3 (L) 04/07/2018   PLT 234.0 04/07/2018   GLUCOSE 87 08/27/2018   CHOL 220 (H) 08/27/2018   TRIG 107 08/27/2018   HDL 88 08/27/2018   LDLDIRECT 107.0 06/30/2017   LDLCALC 111 (H) 08/27/2018   ALT 23 08/27/2018   AST 19 08/27/2018   NA 143 08/27/2018   K 4.4 08/27/2018   CL 108 (H) 08/27/2018   CREATININE 0.90 08/27/2018   BUN 18 08/27/2018   CO2 23 08/27/2018   TSH 2.31 08/23/2016   INR 1.00 02/11/2018   HGBA1C 5.7 (A) 11/03/2018   MICROALBUR <0.7 02/01/2015    MR BRAIN W WO CONTRAST  Result Date: 11/26/2018 [facility] NEUROIMAG[facility] REPORT STUDY DATE: 11/24/18 PATIENT NAME: [name] DOB: 1960-08-29 MRN: [id number] ORDERING CLINICIAN: Sarina Ill, MD CLINICAL HISTORY: 59 year old female with headaches. EXAM: MRI brain (with and without) TECHNIQUE: MRI of the brain with and without contrast was obtained utilizing 5 mm axial slices with T1, T2, T2 flair, SWI and diffusion weighted views.  T1 sagittal, T2 coronal and postcontrast views in the axial and coronal plane were obtained. CONTRAST: 59m multihance COMPARISON: 05/07/16 [facility] [address] [place]1.5 Tesla MRI)  FINDINGS: No abnormal lesions are seen on diffusion-weighted views to suggest acute ischemia. The cortical sulci, fissures and cisterns are normal in size and appearance. Lateral, third and fourth ventricle are normal in size and appearance. No extra-axial fluid collections are  seen. No evidence of mass effect or midline shift.  Mild periventricular, subcortical, juxtacortical foci of chronic small vessel ischemic disease. No abnormal lesions on post-contrast views. On sagittal views the posterior fossa, pituitary gland and corpus callosum are unremarkable. No evidence of intracranial hemorrhage on SWI views. The orbits and their contents, paranasal sinuses and calvarium are notable for mucus thickening in the left maxillary and ethmoid sinuses. Bilateral mastoid air cell effusions. Intracranial flow voids are present.   MRI brain (with and without) demonstrating: - Stable, mild periventricular, subcortical, juxtacortical foci of chronic small vessel ischemic disease. - No acute findings. INTERPRETING PHYSICIAN: VPenni Bombard MD Certified in Neurology, Neurophysiology and Neuroimaging [facility] [address] [place]  [phone]   Assessment & Plan:    Follow-up: No follow-ups on file.  AWalker Kehr MD

## 2019-02-01 NOTE — Assessment & Plan Note (Signed)
On O2 2 l/min [employer]

## 2019-02-01 NOTE — Assessment & Plan Note (Signed)
Metformin, Januvia 

## 2019-02-02 ENCOUNTER — Other Ambulatory Visit: Payer: Self-pay | Admitting: *Deleted

## 2019-02-02 NOTE — Patient Outreach (Signed)
[place] [name]) Care Management  [place]  02/02/2019   [name] [facility] [birth year] [id number]  RN Health Coach telephone call to patient.  Hipaa compliance verified. Per patient she is on oxygen continuously. Per patient she has cut her oxygen up to 2 1/2 liters and her sat is 98%. Patient uses a walker and cane to ambulate. She has had 5 falls recently with no injury. Patient sleeps on 3 pillows. She has an aide that come in and helps bathe and dress. Per patient she is unable to do that now without assist.  Patient stated that she has a medical alert on the apartment wall and carries her phone with her all the time. Patient stated that the power went out and her oxygen ran out one time. She was afraid to go to sleep for fear she would die. RN discussed contacting fire dept and make them aware she is on oxygen and then she could call them and they would come quick if any problems. Patient has agreed to follow up outreach calls.     Encounter Medications:  Outpatient Encounter Medications as of 02/02/2019  Medication Sig  . Adalimumab (HUMIRA PEN-PS/UV/ADOL HS START) 40 MG/0.8ML PNKT Inject 40 mg into the skin once a week.  . B-D ULTRA-FINE 33 LANCETS MISC Use as instructed to test sugars once  daily  . Blood Glucose Monitoring Suppl (ONETOUCH VERIO) w/Device KIT Use to check blood sugar once a day  . budesonide-formoterol (SYMBICORT) 160-4.5 MCG/ACT inhaler Inhale 2 puffs into the lungs TWICE DAILY 120/4=30  . carvedilol (COREG) 12.5 MG tablet Take 1 tablet (12.5 mg total) by mouth 2 (two) times daily with a meal.  . clonazePAM (KLONOPIN) 0.5 MG tablet Take 1 TO 2 tablets (0.5-1 mg total) by mouth at bedtime as needed for anxiety. (Patient taking differently: Take 0.5 mg by mouth at bedtime. )  . DEXILANT 60 MG capsule Take 1 capsule (60 mg total) by mouth daily.  . diclofenac Sodium (VOLTAREN) 1 % GEL Apply 2 g topically 2 (two) times daily as needed (for  pain). 100/4=25  . furosemide (LASIX) 40 MG tablet TAKE 1 TABLET BY MOUTH EVERY MORNING FOR EDEMA (Patient taking differently: Take 40 mg by mouth daily as needed for fluid or edema. )  . gabapentin (NEURONTIN) 300 MG capsule Take 1 capsule (300 mg total) by mouth 2 (two) times daily as needed (for pain). (Patient taking differently: Take 300 mg by mouth 2 (two) times daily. )  . glucose blood (ONETOUCH VERIO) test strip Use as instructed to check blood sugar once a day.  . hydrOXYzine (ATARAX/VISTARIL) 25 MG tablet Take 1 tablet (25 mg total) by mouth every 8 (eight) hours as needed for itching.  . metFORMIN (GLUCOPHAGE-XR) 500 MG 24 hr tablet TAKE 2 TABLETS(1000 MG) BY MOUTH DAILY WITH BREAKFAST (Patient taking differently: Take 1,000 mg by mouth at bedtime. TAKE 2 TABLETS(1000 MG) BY MOUTH DAILY)  . naltrexone (DEPADE) 50 MG tablet Split tablets into quarters. Take one-quarter of a tablet once daily  . OXYGEN Inhale into the lungs. Takes 2 liters every day (24/7)  . potassium chloride (KLOR-CON) 10 MEQ tablet Take 1 tablet (10 mEq total) by mouth daily.  Manus Gunning BOWEL PREP KIT 17.5-3.13-1.6 GM/177ML SOLN SMARTSIG:1 Kit(s) By Mouth Once  . topiramate (TOPAMAX) 50 MG tablet Take 1 tablet (50 mg total) by mouth 2 (two) times daily.  [name] venlafaxine XR (EFFEXOR-XR) 75 MG 24 hr capsule Take 1 capsule (75  mg total) by mouth daily with breakfast.  . VENTOLIN HFA 108 (90 Base) MCG/ACT inhaler Inhale 2 puffs into the lungs every 6 (six) hours as needed for wheezing or shortness of breath. 200/8=25 (Patient taking differently: Inhale 2 puffs into the lungs every 6 (six) hours as needed for wheezing or shortness of breath. )  . XARELTO 20 MG TABS tablet Take 1 tablet (20 mg total) by mouth at bedtime.   No facility-administered encounter medications on file as of 02/02/2019.    Functional Status:  In your present state of health, do you have any difficulty performing the following activities: 02/02/2019  04/29/2018  Hearing? N N  Vision? N N  Difficulty concentrating or making decisions? N N  Walking or climbing stairs? Y N  Comment Patient uses a walker and is on continously O2 -  Dressing or bathing? Y N  Comment patient has a caregiver -  Doing errands, shopping? Y N  Comment caregiver and family handles all shopping -  Preparing Food and eating ? Y N  Using the Toilet? N N  In the past six months, have you accidently leaked urine? N N  Do you have problems with loss of bowel control? N N  Managing your Medications? N N  Managing your Finances? N N  Housekeeping or managing your Housekeeping? Y N  Comment Patient has a caregiver -  Some recent data might be hidden    Fall/Depression Screening: Fall Risk  02/02/2019 10/27/2018 07/29/2018  Falls in the past year? 1 0 0  Comment 5 falls - -  Number falls in past yr: 1 - -  Injury with Fall? 0 - -  Risk Factor Category  - - -  Risk for fall due to : History of fall(s);Impaired balance/gait;Impaired mobility - -  Follow up Falls evaluation completed;Falls prevention discussed;Education provided - -   PHQ 2/9 Scores 02/02/2019 04/29/2018 04/29/2018 11/07/2017 10/06/2017 10/02/2017 02/17/2017  PHQ - 2 Score _0 0 0 2  PHQ- 9 Score _1 - - 7   THN CM Care Plan Problem One     Most Recent Value  Care Plan Problem One  knowledge deficit related to COPD  Role Documenting the Problem One  [place] for Problem One  Active  [facility] Long Term Goal   patient will not be radmitted for COPD exacerbation within the next 90 days  THN Long Term Goal Start Date  02/02/19  Interventions for Problem One Long Term Goal  RN discussed COPD zones and action plan. RN discussed action plan for O2 when power goes out. Rn discussed medication adherence  THN CM Short Term Goal #1   Patient will verbalize receiving information on COPD and activity within 30 days  THN CM Short Term Goal #1 Start Date  02/02/19  Interventions for Short Term  Goal #1  RN discussed physical activity and rest. RN sent educational material on COPD and physical activity  THN CM Short Term Goal #2   Patient will not have any falls within the next 30 days  THN CM Short Term Goal #2 Start Date  02/02/19  Interventions for Short Term Goal #2  RNdiscussed fall prevention. RN sent educational material on falls. RN will follow up for further discussion  THN CM Short Term Goal #3  RN will verbalize receiving information onCOPD and eating within the next 30 days  THN CM Short Term Goal #3 Start Date  02/02/19  Interventions for Short Tern Goal #3  RN discussed eating frequent small meals. RN sent educational material on COPD and eating. RN will follow up with further discussion      Assessment:  Patient has an aide that assists with baths Patient has medical alert system on apartment wall Patient was not aware that she had COPD Patient is not doing much physical activity Patient has had frequent falls with no injury Patient is having difficulty preparing meals shopping for food Plan: Referred to  Social worker  RN discussed with patient about COPD and diet RN discussed with patient about physical activity and rest RN sent clinical key education on activity and eating RN discussed fall prevention RN sent educational material on fall prevention RN sent a 2021 Calendar book RN will follow up within the month of April

## 2019-02-04 ENCOUNTER — Other Ambulatory Visit: Payer: Self-pay

## 2019-02-04 NOTE — Patient Outreach (Signed)
[place] [facility]) Care Management  02/04/2019  Sanam Dwiggins [facility] [birth year] [id number]   Social Work referral received from [employer], [employer].  "Patient is unable to go shopping for food and is on oxygen 24 hrs. Patient is morbidly obese. She would like to know if she could get meals on wheels or mommy meals." Successful outreach to patient today.   Patient reports having pain in left shoulder/arm which is limiting her ability to prepare food.  She does have a pcs aide but stated that she does not like her to cook meat because she has really long fingernails and feels that it's not sanitary.  She does ask that aide wear gloves when preparing any food. Asked if patient has discussed this concern directly with aide or supervisor but she has not.  Outside of this issue, patient is very happy with aide.  Patient stated "She does what she is supposed to do without me having to stay on her"  She also stated that she really trusts her and knows that she does not have to worry about her stealing anything. Unfortunately, patient is below the age requirement for Meals on Wheels.  Did agree to refer her to [facility] but informed her that this will only be for thirty days.  Patient has an upcoming ortho appointment to address shoulder/arm issues.   Closing social work case but did encourage her to call if additional needs arise  Geographical information systems officer, Systems developer [phone]

## 2019-02-05 ENCOUNTER — Telehealth: Payer: Self-pay | Admitting: Adult Health

## 2019-02-05 DIAGNOSIS — J9611 Chronic respiratory failure with hypoxia: Secondary | ICD-10-CM

## 2019-02-05 NOTE — Telephone Encounter (Signed)
Spoke with [facility] and they stated they needed the OV notes and qualifying walk for pt. They also need an order but it looks like pt was walked with pulse oxygen. TP did you want pt to have a POC? I can send order to [employer] since the order wasn't placed at the time of visit. Please advise.     Fax [phone]

## 2019-02-05 NOTE — Telephone Encounter (Signed)
Apologies.  Was not aware when patient was in the office that she needed an order.  Only qualified patient as is office protocol when qualifying sats have not recently been obtained.  Verbal authorization provided by TP for the O2 order - signed.  Nothing further needed at this time; will sign off.

## 2019-02-09 ENCOUNTER — Telehealth: Payer: Self-pay | Admitting: Internal Medicine

## 2019-02-09 NOTE — Telephone Encounter (Signed)
   Patient calling to confirm information was faxed to [name], requesting to increase hours for home health aid.  Fax [phone] Please advise

## 2019-02-10 NOTE — Telephone Encounter (Signed)
Pt notified paperwork was faxed

## 2019-02-11 ENCOUNTER — Other Ambulatory Visit: Payer: Self-pay

## 2019-02-11 NOTE — Unmapped (Signed)
[facility] Refill Coordination Note    Specialty Medication(s) to be Shipped:   Inflammatory Disorders: Humira    Other medication(s) to be shipped: n/a     Rudi Coco, DOB: Oct 15, 1960  Phone: [phone] (home)       All above HIPAA information was verified with patient.     Was a Nurse, learning disability used for this call? No    Completed refill call assessment today to schedule patient's medication shipment from the [facility] [phone]).       Specialty medication(s) and dose(s) confirmed: Regimen is correct and unchanged.   Changes to medications: Guadalupe reports no changes at this time.  Changes to insurance: No  Questions for the pharmacist: No    Confirmed patient received Welcome Packet with first shipment. The patient will receive a drug information handout for each medication shipped and additional FDA Medication Guides as required.       DISEASE/MEDICATION-SPECIFIC INFORMATION        For patients on injectable medications: Patient currently has 1 doses left.  Next injection is scheduled for 2/3.    SPECIALTY MEDICATION ADHERENCE     Medication Adherence    Patient reported X missed doses in the last month: 0  Specialty Medication: Humira CF 40 mg/0.4 ml  Patient is on additional specialty medications: No  Patient is on more than two specialty medications: No  Any gaps in refill history greater than 2 weeks in the last 3 months: no  Demonstrates understanding of importance of adherence: yes  Informant: patient                Humira 40mg /0.35ml: Patient has 7 days of medication on hand      SHIPPING     Shipping address confirmed in Epic.     Delivery Scheduled: Yes, Expected medication delivery date: 2/4.     Medication will be delivered via UPS to the prescription address in Epic WAM.    Olga Millers   [facility] Specialty Technician

## 2019-02-11 NOTE — Patient Outreach (Signed)
[place] [facility]) Care Management  02/11/2019  [name] [facility] 1960/05/22 [id number]   Got call from patient stating she did not receive [employer] Meals delivery scheduled for this week.  Contacted intake and was told that, due to a technical error", the referral was not processed.  Patient is now scheduled to receive delivery on 02/16/19.  Ronn Melena, BSW Social Worker [phone]

## 2019-02-15 ENCOUNTER — Other Ambulatory Visit: Payer: Self-pay | Admitting: Internal Medicine

## 2019-02-17 ENCOUNTER — Ambulatory Visit (INDEPENDENT_AMBULATORY_CARE_PROVIDER_SITE_OTHER): Payer: Medicare Other | Admitting: Orthopedic Surgery

## 2019-02-17 ENCOUNTER — Ambulatory Visit (INDEPENDENT_AMBULATORY_CARE_PROVIDER_SITE_OTHER): Payer: Medicare Other

## 2019-02-17 ENCOUNTER — Other Ambulatory Visit: Payer: Self-pay

## 2019-02-17 DIAGNOSIS — M7552 Bursitis of left shoulder: Secondary | ICD-10-CM

## 2019-02-17 DIAGNOSIS — M792 Neuralgia and neuritis, unspecified: Secondary | ICD-10-CM

## 2019-02-17 DIAGNOSIS — M25512 Pain in left shoulder: Secondary | ICD-10-CM

## 2019-02-17 DIAGNOSIS — R29898 Other symptoms and signs involving the musculoskeletal system: Secondary | ICD-10-CM

## 2019-02-17 MED FILL — HUMIRA PEN CITRATE FREE 40 MG/0.4 ML: 28 days supply | Qty: 4 | Fill #11 | Status: AC

## 2019-02-17 MED FILL — HUMIRA PEN CITRATE FREE 40 MG/0.4 ML: 28 days supply | Qty: 4 | Fill #11

## 2019-02-19 ENCOUNTER — Encounter: Payer: Self-pay | Admitting: Orthopedic Surgery

## 2019-02-19 DIAGNOSIS — M7552 Bursitis of left shoulder: Secondary | ICD-10-CM

## 2019-02-19 MED ORDER — LIDOCAINE HCL 1 % IJ SOLN
5.0000 mL | INTRAMUSCULAR | Status: AC | PRN
  Administered 2019-02-19: 22:00:00 5 mL

## 2019-02-19 MED ORDER — BUPIVACAINE HCL 0.5 % IJ SOLN
9.0000 mL | INTRAMUSCULAR | Status: AC | PRN
  Administered 2019-02-19: 22:00:00 9 mL via INTRA_ARTICULAR

## 2019-02-19 MED ORDER — METHYLPREDNISOLONE ACETATE 40 MG/ML IJ SUSP
40.0000 mg | INTRAMUSCULAR | Status: AC | PRN
  Administered 2019-02-19: 22:00:00 40 mg via INTRA_ARTICULAR

## 2019-02-19 NOTE — Progress Notes (Signed)
Office Visit Note   Patient: [name]           Date of Birth: 1961/01/12           MRN: [id number] Visit Date: 02/17/2019 Requested by: Cassandria Anger, MD [place],  [place] [id number] PCP: Cassandria Anger, MD  Subjective: Chief Complaint  Patient presents with  . Left Shoulder - Pain    HPI: [name] is a 59 y.o. female who presents to the office complaining of left shoulder pain.  Patient notes pain since October of last year.  She denies any injury leading to the onset of pain.  She localizes the pain to the anterior shoulder and notes that it travels into her hand.  She has pain throughout the "whole arm" often.  She qualifies the pain as a sharp pain without any burning or numbness/tingling.  She states that she has been dropping things and notes an increase in clumsiness recently.  She is unable to lay on her left side.  She denies any neck pain.  She has a history of sarcoidosis as well as fibromyalgia.  She has tried taking over-the-counter pain medication but this does not provide relief nor does gabapentin provide any relief.  She has a history of steroid-induced diabetes but denies any history of thyroid issues.  She does also note a history of deep vein thrombosis and she is on Xarelto as well as Humira..                ROS:  All systems reviewed are negative as they relate to the chief complaint within the history of present illness.  Patient denies fevers or chills.  Assessment & Plan: Visit Diagnoses:  1. Upper extremity weakness   2. Left shoulder pain, unspecified chronicity   3. Radicular pain in left arm     Plan: Patient is a 59 year old female who presents complaining of left shoulder pain.  She has left shoulder pain that radiates down her entire arm.  She has no neck pain but does have complaints of increased clumsiness and dropping objects as well as subjective weakness on exam with biceps, triceps, grip  strength, finger abduction.  X-rays taken today of left shoulder and neck reveal no significant findings in the left shoulder which could be related to the pain in her shoulder but she does have some mild degenerative changes in her neck.  It is possible  that she may have some disc pathology in her neck that is responsible for the radicular pain as well as her weakness.  Plan for cervical spine MRI for further evaluation.  Diagnostic and therapeutic subacromial injection also performed today.  Patient will follow-up after MRI to review results.  Follow-Up Instructions: No follow-ups on file.   Orders:  Orders Placed This Encounter  Procedures  . XR Shoulder Left  . XR Cervical Spine 2 or 3 views  . MR Cervical Spine w/o contrast   No orders of the defined types were placed in this encounter.     Procedures: Large Joint Inj: L subacromial bursa on 02/19/2019 10:10 PM Indications: diagnostic evaluation and pain Details: 18 G 1.5 in needle, posterior approach  Arthrogram: No  Medications: 9 mL bupivacaine 0.5 %; 40 mg methylPREDNISolone acetate 40 MG/ML; 5 mL lidocaine 1 % Outcome: tolerated well, no immediate complications Procedure, treatment alternatives, risks and benefits explained, specific risks discussed. Consent was given by the patient. Immediately prior to procedure a time  out was called to verify the correct patient, procedure, equipment, support staff and site/side marked as required. Patient was prepped and draped in the usual sterile fashion.       Clinical Data: No additional findings.  Objective: Vital Signs: LMP 05/19/2003   Physical Exam:  Constitutional: Patient appears well-developed HEENT:  Head: Normocephalic Eyes:EOM are normal Neck: Normal range of motion Cardiovascular: Normal rate Pulmonary/chest: Effort normal Neurologic: Patient is alert Skin: Skin is warm Psychiatric: Patient has normal mood and affect  Ortho Exam:  Left shoulder Exam Able  to forward flex and abduct shoulder overhead No loss of ER relative to the other shoulder.  Good endpoint with ER No TTP over the [facility] joint or bicipital groove Good subscapularis, supraspinatus, and infraspinatus strength  Marked weakness of the bicep, tricep, grip, finger abduction  Specialty Comments:  No specialty comments available.  Imaging: No results found.   PMFS History: Patient Active Problem List   Diagnosis Date Noted  . Shoulder pain, bilateral 02/01/2019  . Physical deconditioning 01/28/2019  . Chronic anticoagulation 11/19/2018  . Insulin dependent diabetes mellitus type IA ([place]) 11/19/2018  . Oxygen dependent 11/19/2018  . Family history of colon cancer 10/26/2018  . Myringotomy tube status 08/21/2018  . Vaginal candidiasis 07/15/2018  . Nasal crusting 06/12/2018  . Chronic serous otitis media of left ear 06/04/2018  . Mixed conductive and sensorineural hearing loss of left ear with restricted hearing of right ear 06/04/2018  . Chronic cholecystitis with calculus 02/11/2018  . Thrush, oral 01/15/2018  . Nasal sinus congestion 01/15/2018  . Dyslipidemia 10/28/2017  . Pruritus 10/23/2017  . Abnormal liver function test   . Anemia 09/24/2017  . Migraines 09/17/2017  . Cholecystitis, acute 09/12/2017  . COPD (chronic obstructive pulmonary disease) ([place]) 12/30/2016  . Diastolic dysfunction [id number]  . OSA (obstructive sleep apnea) 09/30/2016  . Chronic respiratory failure with hypoxia ([place]) 08/23/2016  . Pulmonary embolism ([place]) 07/21/2016  . DVT (deep venous thrombosis) ([place]) 07/21/2016  . Weight gain 12/20/2015  . Controlled type 2 diabetes mellitus with chronic kidney disease, without long-term current use of insulin ([place]) 02/01/2015  . Abnormal SPEP 06/04/2013  . Memory loss 04/27/2013  . Panic attacks 03/17/2013  . Rash 10/27/2012  . Pulmonary nodules 07/20/2012  . CAD in native artery 05/28/2012  . Seborrheic dermatitis 01/20/2012  . Obesity,  Class III, BMI 40-49.9 (morbid obesity) ([place]) 03/07/2010  . INSOMNIA, CHRONIC 03/07/2010  . Allergic rhinitis 10/26/2009  . Essential hypertension 08/27/2007  . B12 deficiency 08/25/2007  . Vitamin D deficiency 08/25/2007  . SINUSITIS, ACUTE 02/02/2007  . Osteoarthritis 02/02/2007  . Sarcoidosis 08/14/2006  . Anxiety state 08/14/2006  . Major depressive disorder, recurrent episode ([place]) 08/14/2006  . GERD 08/14/2006   Past Medical History:  Diagnosis Date  . Allergic rhinitis   . ALLERGIC RHINITIS 10/26/2009  . Anxiety   . ANXIETY 08/14/2006  . B12 DEFICIENCY 08/25/2007  . Complication of anesthesia    pt has had difficulty following anesthesia with her knee in 2016-unable to care for herself afterward  . Confusion   . Depression    takes Lexapro daily  . Diabetes mellitus without complication ([place])    was on insulin but has been off since Nov 2015 and now only takes Metformin daily  . DYSPNEA 04/28/2009   with exertion  . Esophageal reflux    takes Nexium daily  . Fibromyalgia   . Headache    last migraine 2-53yrs ago;takes Topamax daily  . History  of shingles   . Hypertension    takes Coreg daily  . Insomnia    takes Nortriptyline nightly   . Joint pain   . Joint swelling   . Left knee pain   . Lichen planus   . Long-term memory loss   . Nocturia   . OSA (obstructive sleep apnea)    doesn't use CPAP;sleep study in epic from 2006  . Osteoarthritis   . Osteoarthritis   . Pneumonia    over 30 yrs ago  . Protein calorie malnutrition ([place])   . Rheumatoid arthritis ([place])   . Sarcoidosis    Dr. Lolita Patella  . Short-term memory loss   . Shortness of breath   . Sleep apnea     wears oxygen  . TIA (transient ischemic attack)   . Unsteady gait   . Urinary urgency   . Vitamin D deficiency    is supposed to take Vit D but can't afford it  . VITAMIN D DEFICIENCY 08/25/2007    Family History  Problem Relation Age of Onset  . Heart disease Mother   . Kidney disease  Mother        renal failure  . Cancer Father        leukemia  . Hypertension Other   . Coronary artery disease Other        female 1st degree relative <60  . Heart failure Other        congestive  . Coronary artery disease Other        Female 1st degree relative <50  . Breast cancer Other        1st degree relative <50 S  . Breast cancer Sister   . Colon cancer Brother 62  . Stroke Brother   . Heart attack Brother   . Esophageal cancer Neg Hx   . Stomach cancer Neg Hx   . Rectal cancer Neg Hx     Past Surgical History:  Procedure Laterality Date  . APPENDECTOMY    . arthroscopic knee surgery Right 11-12-04  . AXILLARY ABCESS IRRIGATION AND DEBRIDEMENT  Jul & Aug2012  . CARPAL TUNNEL RELEASE Left 05/23/2014   Procedure: CARPAL TUNNEL RELEASE;  Surgeon: Meredith Pel, MD;  Location: [place];  Service: Orthopedics;  Laterality: Left;  . CHOLECYSTECTOMY N/A 02/11/2018   Procedure: LAPAROSCOPIC CHOLECYSTECTOMY WITH INTRAOPERATIVE CHOLANGIOGRAM ERAS PATHWAY;  Surgeon: Donnie Mesa, MD;  Location: [place];  Service: General;  Laterality: N/A;  . cyst removed from top of buttocks  at age 77  . ENDOMETRIAL ABLATION    . IR CHOLANGIOGRAM EXISTING TUBE  11/21/2017  . IR CHOLANGIOGRAM EXISTING TUBE  01/16/2018  . IR EXCHANGE BILIARY DRAIN  11/10/2017  . IR PATIENT EVAL TECH 0-60 MINS  02/03/2018  . IR PERC CHOLECYSTOSTOMY  09/13/2017  . IR RADIOLOGIST EVAL & MGMT  10/08/2017  . LACRIMAL DUCT EXPLORATION Right 06/26/2017   Procedure: LACRIMAL DUCT EXPLORATION AND ETHMOIDECTOMY;  Surgeon: Clista Bernhardt, MD;  Location: [facility];  Service: Ophthalmology;  Laterality: Right;  . TEAR DUCT PROBING Right 06/26/2017   Procedure: TEAR DUCT PROBING WITH STENT;  Surgeon: Clista Bernhardt, MD;  Location: [facility];  Service: Ophthalmology;  Laterality: Right;  . TOTAL KNEE ARTHROPLASTY Right 11/15/2014   Procedure: TOTAL RIGHT KNEE ARTHROPLASTY;  Surgeon: Meredith Pel, MD;  Location: [place];  Service:  Orthopedics;  Laterality: Right;  . TOTAL KNEE ARTHROPLASTY Left 07/13/2015   Procedure: LEFT TOTAL KNEE ARTHROPLASTY;  Surgeon:  Meredith Pel, MD;  Location: [place];  Service: Orthopedics;  Laterality: Left;   Social History   Occupational History  . Occupation: disabled  Tobacco Use  . Smoking status: Former Smoker    Packs/day: 0.50    Years: 10.00    Pack years: 5.00    Types: Cigarettes  . Smokeless tobacco: Never Used  . Tobacco comment: quit smoking in 2004  Substance and Sexual Activity  . Alcohol use: Not Currently    Alcohol/week: 1.0 standard drinks    Types: 1 Glasses of wine per week  . Drug use: No  . Sexual activity: Not Currently

## 2019-02-25 ENCOUNTER — Other Ambulatory Visit: Payer: Self-pay | Admitting: Internal Medicine

## 2019-03-06 ENCOUNTER — Other Ambulatory Visit: Payer: Self-pay | Admitting: Internal Medicine

## 2019-03-12 ENCOUNTER — Telehealth: Payer: Self-pay | Admitting: Adult Health

## 2019-03-12 ENCOUNTER — Other Ambulatory Visit: Payer: Self-pay

## 2019-03-12 ENCOUNTER — Ambulatory Visit: Payer: Medicare Other | Admitting: Orthotics

## 2019-03-12 DIAGNOSIS — M216X1 Other acquired deformities of right foot: Secondary | ICD-10-CM

## 2019-03-12 DIAGNOSIS — E1142 Type 2 diabetes mellitus with diabetic polyneuropathy: Secondary | ICD-10-CM | POA: Diagnosis not present

## 2019-03-12 DIAGNOSIS — M216X2 Other acquired deformities of left foot: Secondary | ICD-10-CM | POA: Diagnosis not present

## 2019-03-12 NOTE — Telephone Encounter (Signed)
Called and spoke with pt who is requesting a call from TP in regards to questions she has about the covid vaccine.  Tammy please advise as pt wants you to call her. Thanks!

## 2019-03-12 NOTE — Telephone Encounter (Signed)
Please call patient and ask what is her question

## 2019-03-12 NOTE — Telephone Encounter (Signed)
Called the patient and advised her of the response received. Patient voiced understanding, nothing further needed at this time.

## 2019-03-12 NOTE — Telephone Encounter (Signed)
Called pt again to ask what her questions were and pt stated to me she wanted to know if TP could find out why it is that people that have chronic lung problems that are not age 59 or essential workers are not being offered the vaccine at this time. With pt having a chronic disease, she wanted to know where she stood when it came to her getting the vaccine.   COVID-19 Vaccine Information can be found at: ShippingScam.co.uk For questions related to vaccine distribution or appointments, please email [email or web address] or call [phone].     I provided pt the info in regards to the vaccine information and pt verbalized understanding. Routing to TP as an [place].

## 2019-03-12 NOTE — Telephone Encounter (Signed)
Tell her to go ahead and sign up on waiting list . Call local pharmacies and look for vaccines clinic list .  I have heard some are giving to at risk patients right now.

## 2019-03-17 DIAGNOSIS — D869 Sarcoidosis, unspecified: Principal | ICD-10-CM

## 2019-03-17 MED ORDER — ADALIMUMAB PEN CITRATE FREE 40 MG/0.4 ML
SUBCUTANEOUS | 11 refills | 0.00000 days | Status: CP
Start: 2019-03-17 — End: ?
  Filled 2019-03-22: qty 4, 28d supply, fill #0

## 2019-03-17 NOTE — Unmapped (Signed)
Carolyn Newton reports things are going well with her Humira. I informed her her recent refill request was denied since she needs to be seen for an appointment. I provided her with the clinic phone number, and asked that she call today. She has today's dose, but will not have medication for next week's dose at this point.     She mentioned she'd had trouble with constipation. While this is not something we expect with Humira, I advised she could try Colace 1 capsule 1-2 times daily as a start. She will also follow up with her provider about it.     [facility] Clinical Assessment & Refill Coordination Note    Carolyn Newton, DOB: August 21, 1960  Phone: [phone] (home)     All above HIPAA information was verified with patient.     Was a Nurse, learning disability used for this call? No    Specialty Medication(s):   Inflammatory Disorders: Humira     Current Outpatient Medications   Medication Sig Dispense Refill   ??? ADALIMUMAB PEN CITRATE FREE 40 MG/0.4 ML Inject the contents of 1 pen (40 mg) under the skin weekly 4 each 11   ??? albuterol (PROVENTIL HFA;VENTOLIN HFA) 90 mcg/actuation inhaler Inhale 2 puffs.     ??? amoxicillin-clavulanate (AUGMENTIN) 875-125 mg per tablet Take 1 tablet by mouth every twelve (12) hours.     ??? amoxicillin-clavulanate (AUGMENTIN) 875-125 mg per tablet Take 1 tablet by mouth 2 (two) times daily. 42 tablet 0   ??? blood sugar diagnostic (FREESTYLE LITE STRIPS) Strp TEST TWICE DAILY     ??? blood sugar diagnostic (ON CALL EXPRESS TEST STRIP) Strp Use as instructed to check blood sugar once a day. 100 each 12   ??? blood-glucose meter (FREESTYLE LITE METER) kit Use to check blood sugar 2 times per day     ??? blood-glucose meter (ON CALL EXPRESS METER) Misc Use to check blood sugar once a day 1 each 0   ??? carvedilol (COREG) 12.5 MG tablet TAKE 1 TABLET BY MOUTH TWICE DAILY WITH A MEAL     ??? chlorhexidine (PERIDEX) 0.12 % solution 5 mL.     ??? clobetasoL (TEMOVATE) 0.05 % cream Apply topically Two (2) times a day. 60 g 5   ??? clonazePAM (KLONOPIN) 0.5 MG tablet      ??? diclofenac sodium (VOLTAREN) 1 % gel Apply 2 g topically 2 (two) times daily as needed (for pain). 100/4=25     ??? docusate sodium (COLACE) 100 MG capsule Take 100 mg by mouth.     ??? empty container (SHARPS CONTAINER) Misc Use as directed to dispose of Humira needles 1 each 2   ??? empty container Misc USE AS DIRECTED 1 each 2   ??? escitalopram oxalate (LEXAPRO) 20 MG tablet TAKE 1 TABLET BY MOUTH DAILY     ??? esomeprazole (NEXIUM) 40 MG capsule Take 40 mg by mouth.     ??? fluocinonide (LIDEX) 0.05 % cream Apply 1 application topically Two (2) times a day. 60 g 5   ??? fluticasone propionate (FLONASE) 50 mcg/actuation nasal spray 1 spray into each nostril.     ??? furosemide (LASIX) 20 MG tablet Take 20 mg by mouth.     ??? gabapentin (NEURONTIN) 300 MG capsule Take 1 capsule (300 mg total) by mouth 2 (two) times daily as needed (for pain).  1   ??? halobetasol (ULTRAVATE) 0.05 % ointment Apply topically Two (2) times a day. 50 g 5   ???  hydrOXYzine (ATARAX) 25 MG tablet Take 25 mg by mouth.     ??? lancets (FREESTYLE) 28 gauge Misc 1 each.     ??? lancets 30 gauge Misc Use as instructed to test sugars once daily 100 each 12   ??? levoFLOXacin (LEVAQUIN) 750 MG tablet Take 750 mg by mouth.     ??? metFORMIN (GLUCOPHAGE) 500 MG tablet TAKE 2 TABLETS(1000 MG) BY MOUTH DAILY WITH FOOD     ??? methylPREDNISolone (MEDROL DOSEPACK) 4 mg tablet As directed po     ??? moxifloxacin (AVELOX) 400 mg tablet Take 1 tablet (400 mg total) by mouth daily. 7 tablet 0   ??? mupirocin (BACTROBAN) 2 % ointment Apply topically.     ??? mupirocin (BACTROBAN) 2 % ointment Use as directed on nose sores 4 times daily. 30 g 0   ??? naltrexone (DEPADE) 50 mg tablet Split tablets into quarters.  Take one-quarter of a tablet once daily 30 tablet 11   ??? nortriptyline (PAMELOR) 75 MG capsule Take 75 mg by mouth.     ??? nystatin (MYCOSTATIN) 100,000 unit/mL suspension      ??? omeprazole (PRILOSEC) 20 MG capsule Take 20 mg by mouth.     ??? potassium chloride (KLOR-CON 10) 10 MEQ CR tablet TAKE 1 TABLET BY MOUTH DAILY     ??? predniSONE (DELTASONE) 10 MG tablet Take 1 tablet daily with food until next visit.     ??? predniSONE (DELTASONE) 10 MG tablet Take 2 tablets (20 mg total) by mouth daily with breakfast for 5 days. (Patient not taking: Reported on 07/16/2018) 10 tablet 0   ??? rivaroxaban (XARELTO) 20 mg tablet TAKE 1 TABLET BY MOUTH EVERY DAY WITH SUPPER     ??? suvorexant (BELSOMRA) 20 mg tablet Take 20 mg by mouth.     ??? SYMBICORT 160-4.5 mcg/actuation inhaler Inhale 2 puffs into the lungs TWICE DAILY 120/4=30  5   ??? topiramate (TOPAMAX) 50 MG tablet TAKE 1 TABLET BY MOUTH TWICE DAILY     ??? venlafaxine (EFFEXOR-XR) 75 MG 24 hr capsule Take 75 mg by mouth.       Current Facility-Administered Medications   Medication Dose Route Frequency Provider Last Rate Last Admin   ??? triamcinolone acetonide (KENALOG) injection 10 mg  10 mg Intradermal Once Elsie Stain, MD            Changes to medications: Romanda reports no changes at this time.    Allergies   Allergen Reactions   ??? Other Other (See Comments)     Patient has Sarcoidosis and can't tolerate any metals   ??? Suvorexant Other (See Comments)     Larey Seat out of bed while asleep: I'm waking up as I'm falling on the floor; Night terrors   ??? Enalapril Maleate      REACTION: cough   ??? Hydrochlorothiazide      REACTION: Low potassium   ??? Hydroxychloroquine Sulfate      REACTION: vision changes   ??? Iodine      Mother was intolerant--CODED on CT table---pt never tired.   ??? Latex      Hives   ??? Nickel Other (See Comments)     Pt has a titanium right and left knee - nickel causes skin irritations that forms into blisters and sores  Other reaction(s): Other (See Comments)  Pt has a titanium right knee - nickel causes skin irritations that forms into blisters and sores   ??? Adhesive Tape-Silicones Other (See Comments)  Medical tape causes bruising   ??? Doxycycline Diarrhea and Nausea And Vomiting   ??? Pregabalin Other (See Comments)     Made me feel high       Changes to allergies: No    SPECIALTY MEDICATION ADHERENCE     Humira - 1 pen (7 days)    Medication Adherence    Specialty Medication: Humira 40 mg /0.4 ml  Patient is on additional specialty medications: No  Informant: patient  Confirmed plan for next specialty medication refill: delivery by pharmacy  Refills needed for supportive medications: not needed          Specialty medication(s) dose(s) confirmed: Regimen is correct and unchanged.     Are there any concerns with adherence? No    Adherence counseling provided? Not needed    CLINICAL MANAGEMENT AND INTERVENTION      Clinical Benefit Assessment:    Do you feel the medicine is effective or helping your condition? Yes    Clinical Benefit counseling provided? Not needed    Adverse Effects Assessment:    Are you experiencing any side effects? No    Are you experiencing difficulty administering your medicine? No    Quality of Life Assessment:    How many days over the past month did your HS  keep you from your normal activities? For example, brushing your teeth or getting up in the morning. 0    Have you discussed this with your provider? Not needed    Therapy Appropriateness:    Is therapy appropriate? Yes, therapy is appropriate and should be continued    DISEASE/MEDICATION-SPECIFIC INFORMATION      For patients on injectable medications: Patient currently has 1 doses left.  Next injection is scheduled for today (3/3).    PATIENT SPECIFIC NEEDS     ? Does the patient have any physical, cognitive, or cultural barriers? No    ? Is the patient high risk? No     ? Does the patient require a Care Management Plan? No     ? Does the patient require physician intervention or other additional services (i.e. nutrition, smoking cessation, social work)? No      SHIPPING     Specialty Medication(s) to be Shipped:   Na/ no rx to schedule - needs appt    Other medication(s) to be shipped: na     Changes to insurance: No    Delivery Scheduled: not at this time, pt needs appt     Medication will be delivered via na to the confirmed na address in [facility].    The patient will receive a drug information handout for each medication shipped and additional FDA Medication Guides as required.  Verified that patient has previously received a Conservation officer, historic buildings.    All of the patient's questions and concerns have been addressed.    Lanney Gins   [facility] Specialty Pharmacist

## 2019-03-19 NOTE — Unmapped (Signed)
[facility] Refill Coordination Note    Specialty Medication(s) to be Shipped:   Inflammatory Disorders: Humira    Other medication(s) to be shipped: N/A     Carolyn Newton, DOB: [birth year]  Phone: [phone] (home)       All above HIPAA information was verified with patient.     Was a Nurse, learning disability used for this call? No    Completed refill call assessment today to schedule patient's medication shipment from the [facility] [phone]).       Specialty medication(s) and dose(s) confirmed: Regimen is correct and unchanged.   Changes to medications: Carolyn Newton reports no changes at this time.  Changes to insurance: No  Questions for the pharmacist: No    Confirmed patient received Welcome Packet with first shipment. The patient will receive a drug information handout for each medication shipped and additional FDA Medication Guides as required.       DISEASE/MEDICATION-SPECIFIC INFORMATION        For patients on injectable medications: Patient currently has 0 doses left.  Next injection is scheduled for 03/24/2019.    SPECIALTY MEDICATION ADHERENCE     Medication Adherence    Patient reported X missed doses in the last month: 0  Specialty Medication: Humira CF 40 mg/0.4 ml   Patient is on additional specialty medications: No          HUMIRA(CF) PEN 40 mg/0.4 mL injection: 0 days of medicine on hand     SHIPPING     Shipping address confirmed in Epic.     Delivery Scheduled: Yes, Expected medication delivery date: 03/23/2019.     Medication will be delivered via UPS to the prescription address in Epic [facility].    Carolyn [facility] Specialty Technician

## 2019-03-22 MED FILL — HUMIRA PEN CITRATE FREE 40 MG/0.4 ML: 28 days supply | Qty: 4 | Fill #0 | Status: AC

## 2019-03-25 ENCOUNTER — Other Ambulatory Visit: Payer: Medicare Other

## 2019-03-29 ENCOUNTER — Ambulatory Visit: Payer: Medicare Other | Admitting: Orthopedic Surgery

## 2019-04-07 ENCOUNTER — Telehealth: Payer: Self-pay | Admitting: Adult Health

## 2019-04-07 MED ORDER — BUDESONIDE-FORMOTEROL FUMARATE 160-4.5 MCG/ACT IN AERO: INHALATION_SPRAY | RESPIRATORY_TRACT | 5 refills | Status: DC

## 2019-04-07 MED ORDER — ALBUTEROL SULFATE HFA 108 (90 BASE) MCG/ACT IN AERS: 2.0000 | INHALATION_SPRAY | Freq: Four times a day (QID) | RESPIRATORY_TRACT | 4 refills | Status: DC | PRN

## 2019-04-07 NOTE — Telephone Encounter (Signed)
Spoke with patient. She was requesting a refill on her albuterol and Symbicort inhaler. She wishes to use [facility] Advised her that I would send in a few refills for her. She verbalized understanding.   Nothing further needed at time of call.

## 2019-04-08 NOTE — Unmapped (Signed)
[facility] Refill Coordination Note    Specialty Medication(s) to be Shipped:   Inflammatory Disorders: Humira    Other medication(s) to be shipped: none     Carolyn Newton, DOB: 1960/09/20  Phone: [phone] (home)       All above HIPAA information was verified with patient.     Was a Nurse, learning disability used for this call? No    Completed refill call assessment today to schedule patient's medication shipment from the [facility] [phone]).       Specialty medication(s) and dose(s) confirmed: Regimen is correct and unchanged.   Changes to medications: Carolyn Newton reports no changes at this time.  Changes to insurance: No  Questions for the pharmacist: No    Confirmed patient received Welcome Packet with first shipment. The patient will receive a drug information handout for each medication shipped and additional FDA Medication Guides as required.       DISEASE/MEDICATION-SPECIFIC INFORMATION        For patients on injectable medications: Patient currently has 1 doses left.  Next injection is scheduled for 04/13/19.    SPECIALTY MEDICATION ADHERENCE     Medication Adherence    Patient reported X missed doses in the last month: 0  Specialty Medication: Humira 40mg /0.4  Patient is on additional specialty medications: No                Humira 40/0.4 mg/ml: 7 days of medicine on hand          SHIPPING     Shipping address confirmed in Epic.     Delivery Scheduled: Yes, Expected medication delivery date: 04/14/19.     Medication will be delivered via UPS to the prescription address in Epic [facility].    Unk Lightning   [facility] Specialty Technician

## 2019-04-09 ENCOUNTER — Other Ambulatory Visit: Payer: Self-pay | Admitting: Internal Medicine

## 2019-04-13 MED FILL — HUMIRA PEN CITRATE FREE 40 MG/0.4 ML: 28 days supply | Qty: 4 | Fill #1 | Status: AC

## 2019-04-13 MED FILL — HUMIRA PEN CITRATE FREE 40 MG/0.4 ML: 28 days supply | Qty: 4 | Fill #1

## 2019-04-27 ENCOUNTER — Encounter: Payer: Self-pay | Admitting: Sports Medicine

## 2019-04-27 ENCOUNTER — Ambulatory Visit (INDEPENDENT_AMBULATORY_CARE_PROVIDER_SITE_OTHER): Payer: Medicare Other | Admitting: Sports Medicine

## 2019-04-27 ENCOUNTER — Other Ambulatory Visit: Payer: Self-pay

## 2019-04-27 VITALS — Temp 97.7°F

## 2019-04-27 DIAGNOSIS — M79675 Pain in left toe(s): Secondary | ICD-10-CM

## 2019-04-27 DIAGNOSIS — B351 Tinea unguium: Secondary | ICD-10-CM

## 2019-04-27 DIAGNOSIS — M79674 Pain in right toe(s): Secondary | ICD-10-CM | POA: Diagnosis not present

## 2019-04-27 DIAGNOSIS — E1142 Type 2 diabetes mellitus with diabetic polyneuropathy: Secondary | ICD-10-CM | POA: Diagnosis not present

## 2019-04-27 DIAGNOSIS — L439 Lichen planus, unspecified: Secondary | ICD-10-CM

## 2019-04-27 NOTE — Progress Notes (Signed)
Subjective: [name] is a 59 y.o. female patient with history of diabetes who presents to office today complaining of long,mildly painful nails  while ambulating in shoes; unable to trim. Patient states that the glucose reading this morning was 142. Patient admits that she got her new shoes and had a few cramps but otherwise no other issues, patient denies any new changes in medication or new problems. No other issues noted.   Patient Active Problem List   Diagnosis Date Noted  . Shoulder pain, bilateral 02/01/2019  . Physical deconditioning 01/28/2019  . Chronic anticoagulation 11/19/2018  . Insulin dependent diabetes mellitus type IA ([place]) 11/19/2018  . Oxygen dependent 11/19/2018  . Family history of colon cancer 10/26/2018  . Myringotomy tube status 08/21/2018  . Vaginal candidiasis 07/15/2018  . Nasal crusting 06/12/2018  . Chronic serous otitis media of left ear 06/04/2018  . Mixed conductive and sensorineural hearing loss of left ear with restricted hearing of right ear 06/04/2018  . Chronic cholecystitis with calculus 02/11/2018  . Thrush, oral 01/15/2018  . Nasal sinus congestion 01/15/2018  . Dyslipidemia 10/28/2017  . Pruritus 10/23/2017  . Abnormal liver function test   . Anemia 09/24/2017  . Migraines 09/17/2017  . Cholecystitis, acute 09/12/2017  . COPD (chronic obstructive pulmonary disease) ([place]) 12/30/2016  . Diastolic dysfunction 32/99/2426  . OSA (obstructive sleep apnea) 09/30/2016  . Chronic respiratory failure with hypoxia ([place]) 08/23/2016  . Pulmonary embolism ([place]) 07/21/2016  . DVT (deep venous thrombosis) ([place]) 07/21/2016  . Weight gain 12/20/2015  . Controlled type 2 diabetes mellitus with chronic kidney disease, without long-term current use of insulin ([place]) 02/01/2015  . Abnormal SPEP 06/04/2013  . Memory loss 04/27/2013  . Panic attacks 03/17/2013  . Rash 10/27/2012  . Pulmonary nodules 07/20/2012  . CAD in native artery 05/28/2012   . Seborrheic dermatitis 01/20/2012  . Obesity, Class III, BMI 40-49.9 (morbid obesity) ([place]) 03/07/2010  . INSOMNIA, CHRONIC 03/07/2010  . Allergic rhinitis 10/26/2009  . Essential hypertension 08/27/2007  . B12 deficiency 08/25/2007  . Vitamin D deficiency 08/25/2007  . SINUSITIS, ACUTE 02/02/2007  . Osteoarthritis 02/02/2007  . Sarcoidosis 08/14/2006  . Anxiety state 08/14/2006  . Major depressive disorder, recurrent episode ([place]) 08/14/2006  . GERD 08/14/2006   Current Outpatient Medications on File Prior to Visit  Medication Sig Dispense Refill  . Adalimumab (HUMIRA PEN-PS/UV/ADOL HS START) 40 MG/0.8ML PNKT Inject 40 mg into the skin once a week.    [name] albuterol (VENTOLIN HFA) 108 (90 Base) MCG/ACT inhaler Inhale 2 puffs into the lungs every 6 (six) hours as needed for wheezing or shortness of breath. 8 g 4  . B-D ULTRA-FINE 33 LANCETS MISC Use as instructed to test sugars once  daily 100 each 12  . Blood Glucose Monitoring Suppl (ONETOUCH VERIO) w/Device KIT Use to check blood sugar once a day 1 kit 0  . budesonide-formoterol (SYMBICORT) 160-4.5 MCG/ACT inhaler Inhale 2 puffs into the lungs TWICE DAILY 10.2 g 5  . carvedilol (COREG) 12.5 MG tablet Take 1 tablet (12.5 mg total) by mouth 2 (two) times daily with a meal. 180 tablet 3  . clonazePAM (KLONOPIN) 0.5 MG tablet Take 1 TO 2 tablets (0.5-1 mg total) by mouth at bedtime as needed for anxiety. 60 tablet 3  . DEXILANT 60 MG capsule Take 1 capsule (60 mg total) by mouth daily. 90 capsule 2  . diclofenac Sodium (VOLTAREN) 1 % GEL Apply 2 g topically 2 (two) times daily as needed (for  pain). 100/4=25    . furosemide (LASIX) 40 MG tablet TAKE 1 TABLET BY MOUTH EVERY MORNING FOR EDEMA (Patient taking differently: Take 40 mg by mouth daily as needed for fluid or edema. ) 90 tablet 1  . gabapentin (NEURONTIN) 300 MG capsule Take 1 capsule (300 mg total) by mouth 2 (two) times daily as needed (for pain). (Patient taking differently: Take  300 mg by mouth 2 (two) times daily. ) 180 capsule 1  . glucose blood (ONETOUCH VERIO) test strip Use as instructed to check blood sugar once a day. 100 each 12  . hydrOXYzine (ATARAX/VISTARIL) 25 MG tablet Take 1 tablet (25 mg total) by mouth every 8 (eight) hours as needed for itching. 60 tablet 1  . metFORMIN (GLUCOPHAGE-XR) 500 MG 24 hr tablet TAKE 2 TABLETS(1000 MG) BY MOUTH DAILY WITH BREAKFAST (Patient taking differently: Take 1,000 mg by mouth at bedtime. TAKE 2 TABLETS(1000 MG) BY MOUTH DAILY) 180 tablet 2  . naltrexone (DEPADE) 50 MG tablet Split tablets into quarters. Take one-quarter of a tablet once daily    . OXYGEN Inhale into the lungs. Takes 2 liters every day (24/7)    . potassium chloride (KLOR-CON) 10 MEQ tablet Take 1 tablet (10 mEq total) by mouth daily. 90 tablet 0  . SUPREP BOWEL PREP KIT 17.5-3.13-1.6 GM/177ML SOLN SMARTSIG:1 Kit(s) By Mouth Once    . topiramate (TOPAMAX) 50 MG tablet Take 1 tablet (50 mg total) by mouth 2 (two) times daily. 180 tablet 3  . venlafaxine XR (EFFEXOR-XR) 75 MG 24 hr capsule Take 1 capsule (75 mg total) by mouth daily with breakfast. 30 capsule 11  . XARELTO 20 MG TABS tablet Take 1 tablet (20 mg total) by mouth at bedtime. 90 tablet 3  . [DISCONTINUED] clonazePAM (KLONOPIN) 0.5 MG tablet Take 1 TO 2 tablets (0.5-1 mg total) by mouth at bedtime as needed for anxiety. (Patient taking differently: Take 0.5 mg by mouth at bedtime. ) 60 tablet 3   No current facility-administered medications on file prior to visit.   Allergies  Allergen Reactions  . Belsomra [Suvorexant] Other (See Comments)    [place] out of bed while asleep: "I'm waking up as I'm falling on the floor;" "Night terrors"  . Enalapril Maleate Cough  . Latex Hives  . Nickel Other (See Comments)    Blisters  Pt has a titanium right and left knee - nickel causes skin irritations that form into blisters and sores  . Other Other (See Comments)    Patient has Sarcoidosis and can't  tolerate any metals  . Morphine And Related Itching and Nausea Only  . Doxycycline Diarrhea and Nausea And Vomiting  . Hydrochlorothiazide Other (See Comments)    Low potassium levels   . Hydroxychloroquine Sulfate Other (See Comments)    Vision changes   . Lyrica [Pregabalin] Other (See Comments)    "Made me feel high"  . Tape Other (See Comments)    Medical tape causes bruising    No results found for this or any previous visit (from the past 2160 hour(s)).  Objective: General: Patient is awake, alert, and oriented x 3 and in no acute distress.  Integument: Skin is warm, dry and supple bilateral. Nails are tender, long, thickened and dystrophic with subungual debris, consistent with onychomycosis, 1-5 bilateral. No signs of infection. No open lesions or preulcerative lesions present bilateral. Small hyperpigmented patches bilateral, hx of lichen planus unchanged. Remaining integument unremarkable.  Vasculature:  Dorsalis Pedis pulse 1/4 bilateral. Posterior  Tibial pulse  0/4 bilateral.  Capillary fill time <3 sec 1-5 bilateral. Positive hair growth to the level of the digits. Temperature gradient within normal limits. No varicosities present bilateral. Trace edema present bilateral.   Neurology: The patient has diminished sensation measured with a 5.07/10g Semmes Weinstein Monofilament at all pedal sites bilateral . Vibratory sensation diminished bilateral with tuning fork. No Babinski sign present bilateral.   Musculoskeletal: Asymptomatic pes planus pedal deformities noted bilateral. Chronic heel pain bilateral. Muscular strength 5/5 in all lower extremity muscular groups bilateral without pain on range of motion. No tenderness with calf compression bilateral.  Assessment and Plan: Problem List Items Addressed This Visit    None    Visit Diagnoses    Pain due to onychomycosis of toenails of both feet    -  Primary   Diabetic polyneuropathy associated with type 2 diabetes  mellitus (HCC)       Lichen planus         -Examined patient. -Re-Discussed and educated patient on diabetic foot care, especially with  regards to the vascular, neurological and musculoskeletal systems.  -Stressed the importance of good glycemic control and the detriment of not  controlling glucose levels in relation to the foot. -Mechanically debrided all nails 1-5 bilateral using sterile nail nipper and filed with dremel without incident  -Continue with diabetic shoes and advised patient to add back in liner in shoes to take up space since she is experiencing cramping -Answered all patient questions -Patient to return  in 3 months for at risk foot care -Patient advised to call the office if any problems or questions arise in the meantime.  Landis Martins, DPM

## 2019-04-29 ENCOUNTER — Other Ambulatory Visit: Payer: Self-pay | Admitting: Internal Medicine

## 2019-05-04 ENCOUNTER — Other Ambulatory Visit: Payer: Self-pay | Admitting: *Deleted

## 2019-05-04 ENCOUNTER — Other Ambulatory Visit: Payer: Self-pay

## 2019-05-04 ENCOUNTER — Telehealth (INDEPENDENT_AMBULATORY_CARE_PROVIDER_SITE_OTHER): Payer: Medicare Other | Admitting: Internal Medicine

## 2019-05-04 DIAGNOSIS — N10 Acute tubulo-interstitial nephritis: Secondary | ICD-10-CM | POA: Diagnosis not present

## 2019-05-04 MED ORDER — CEFUROXIME AXETIL 250 MG PO TABS: 250.0000 mg | ORAL_TABLET | Freq: Two times a day (BID) | ORAL | 0 refills | Status: AC

## 2019-05-04 NOTE — Progress Notes (Signed)
Virtual Visit via Video Note  I connected with Tran Farish Delone on 05/04/19 at  2:20 PM EDT by a video enabled telemedicine application and verified that I am speaking with the correct person using two identifiers.   I discussed the limitations of evaluation and management by telemedicine and the availability of in person appointments. The patient expressed understanding and agreed to proceed.  History of Present Illness: C/o UTI sx's, LBP  There has been no runny nose, cough, chest pain, shortness of breath, abdominal pain, diarrhea, constipation, arthralgias, skin rashes.   Observations/Objective: The patient appears to be in no acute distress, looks well.  Assessment and Plan:  See my Assessment and Plan. Follow Up Instructions:    I discussed the assessment and treatment plan with the patient. The patient was provided an opportunity to ask questions and all were answered. The patient agreed with the plan and demonstrated an understanding of the instructions.   The patient was advised to call back or seek an in-person evaluation if the symptoms worsen or if the condition fails to improve as anticipated.  I provided face-to-face time during this encounter. We were at different locations.   Walker Kehr, MD

## 2019-05-04 NOTE — Patient Outreach (Signed)
[facility]) Care Management  [place]  05/04/2019   [name] [facility] April 22, 1960 [id number]  RN Health Coach telephone call to patient.  [employer] compliance verified. Per patient she is having shortness of breath on exertion. When she is cooking she has to cut her oxygen off due to cooking with gas. Patient stated that she sits at the stove when cooking. Per patient she has not had any coughing episodes. Patient uses 1/2 liters  to 2 1/2 liters on a regular basis and 3 liters when going to the mailbox. Per patient she is trying to eat better and bakes most foods. Patient stated that when she wants something fried she would order it out but does that seldom.  Patient stated she uses a small plate to help with portion control. Patient stated her weakness is wanting popcorn and chocolate at night time. Per patient she is having some abdominal pain. Patient is having a tele visit with Dr in 1 hour. Patient has not ever had a colonoscopy and her brother is battling colon cancer. Patient will discuss this with Dr at her tele visit. Patient has agreed to further outreach calls.   Encounter Medications:  Outpatient Encounter Medications as of 05/04/2019  Medication Sig  . albuterol (VENTOLIN HFA) 108 (90 Base) MCG/ACT inhaler Inhale 2 puffs into the lungs every 6 (six) hours as needed for wheezing or shortness of breath.  . B-D ULTRA-FINE 33 LANCETS MISC Use as instructed to test sugars once  daily  . Blood Glucose Monitoring Suppl (ONETOUCH VERIO) w/Device KIT Use to check blood sugar once a day  . budesonide-formoterol (SYMBICORT) 160-4.5 MCG/ACT inhaler Inhale 2 puffs into the lungs TWICE DAILY  . carvedilol (COREG) 12.5 MG tablet Take 1 tablet (12.5 mg total) by mouth 2 (two) times daily with a meal.  . cefUROXime (CEFTIN) 250 MG tablet Take 1 tablet (250 mg total) by mouth 2 (two) times daily for 10 days.  . clonazePAM (KLONOPIN) 0.5 MG tablet Take 1 TO 2 tablets  (0.5-1 mg total) by mouth at bedtime as needed for anxiety.  . DEXILANT 60 MG capsule Take 1 capsule (60 mg total) by mouth daily.  . diclofenac Sodium (VOLTAREN) 1 % GEL Apply 2 g topically 2 (two) times daily as needed (for pain). 100/4=25  . furosemide (LASIX) 40 MG tablet TAKE 1 TABLET BY MOUTH EVERY MORNING FOR EDEMA (Patient taking differently: Take 40 mg by mouth daily as needed for fluid or edema. )  . gabapentin (NEURONTIN) 300 MG capsule Take 1 capsule (300 mg total) by mouth 2 (two) times daily as needed (for pain). (Patient taking differently: Take 300 mg by mouth 2 (two) times daily. )  . glucose blood (ONETOUCH VERIO) test strip Use as instructed to check blood sugar once a day.  [name] HUMIRA PEN 40 MG/0.4ML PNKT SMARTSIG:40 Milligram(s) SUB-Q Once a Week  . hydrOXYzine (ATARAX/VISTARIL) 25 MG tablet Take 1 tablet (25 mg total) by mouth every 8 (eight) hours as needed for itching.  . metFORMIN (GLUCOPHAGE-XR) 500 MG 24 hr tablet TAKE 2 TABLETS(1000 MG) BY MOUTH DAILY WITH BREAKFAST (Patient taking differently: Take 1,000 mg by mouth at bedtime. TAKE 2 TABLETS(1000 MG) BY MOUTH DAILY)  . naltrexone (DEPADE) 50 MG tablet Split tablets into quarters. Take one-quarter of a tablet once daily  . OXYGEN Inhale into the lungs. Takes 2 liters every day (24/7)  . potassium chloride (KLOR-CON) 10 MEQ tablet Take 1 tablet (10 mEq total) by mouth  daily.  Manus Gunning BOWEL PREP KIT 17.5-3.13-1.6 GM/177ML SOLN SMARTSIG:1 Kit(s) By Mouth Once  . topiramate (TOPAMAX) 50 MG tablet Take 1 tablet (50 mg total) by mouth 2 (two) times daily.  [name] UNABLE TO FIND Use as instructed to check blood sugar once a day.  . venlafaxine XR (EFFEXOR-XR) 75 MG 24 hr capsule Take 1 capsule (75 mg total) by mouth daily with breakfast.  . XARELTO 20 MG TABS tablet Take 1 tablet (20 mg total) by mouth at bedtime.  . [DISCONTINUED] clonazePAM (KLONOPIN) 0.5 MG tablet Take 1 TO 2 tablets (0.5-1 mg total) by mouth at bedtime as needed  for anxiety. (Patient taking differently: Take 0.5 mg by mouth at bedtime. )   No facility-administered encounter medications on file as of 05/04/2019.    Functional Status:  In your present state of health, do you have any difficulty performing the following activities: 02/02/2019  Hearing? N  Vision? N  Difficulty concentrating or making decisions? N  Walking or climbing stairs? Y  Comment Patient uses a walker and is on continously O2  Dressing or bathing? Y  Comment patient has a caregiver  Doing errands, shopping? Y  Comment caregiver and family handles all shopping  Preparing Food and eating ? Y  Using the Toilet? N  In the past six months, have you accidently leaked urine? N  Do you have problems with loss of bowel control? N  Managing your Medications? N  Managing your Finances? N  Housekeeping or managing your Housekeeping? Y  Comment Patient has a caregiver  Some recent data might be hidden    Fall/Depression Screening: Fall Risk  05/04/2019 02/02/2019 10/27/2018  Falls in the past year? 1 1 0  Comment - 5 falls -  Number falls in past yr: 1 1 -  Injury with Fall? 0 0 -  Risk Factor Category  - - -  Risk for fall due to : History of fall(s);Impaired balance/gait;Impaired mobility History of fall(s);Impaired balance/gait;Impaired mobility -  Follow up Falls evaluation completed;Falls prevention discussed Falls evaluation completed;Falls prevention discussed;Education provided -   [facility] 2/9 Scores 02/02/2019 04/29/2018 04/29/2018 11/07/2017 10/06/2017 10/02/2017 02/17/2017  PHQ - 2 Score 4 4 4 3  0 0 2  PHQ- 9 Score 19 19 19 8  - - 7   THN CM Care Plan Problem One     Most Recent Value  Care Plan Problem One  knowledge deficit related to COPD  Role Documenting the Problem One  [place] for Problem One  Active  [facility]N Long Term Goal   patient will not be readmitted for COPD exacerbation within the next 90 days  Interventions for Problem One Long Term Goal  RN  reiterated COPD zones and action plan. RN will follow up with further discussion.  THN CM Short Term Goal #1   Patient will verbalize receiving information on COPD and activity within 30 days  [facility]N CM Short Term Goal #1 Met Date  05/04/19  Interventions for Short Term Goal #2  RN reiterated fall prevention.Rn will follow up with further discussion  THN CM Short Term Goal #3  RN will verbalize receiving information on COPD and eating within the next 30 days  THN CM Short Term Goal #3 Met Date  05/04/19  [facility] CM Short Term Goal #4  Patient will verbalize starting a chair exercise routine within the next 30 days  THN CM Short Term Goal #4 Start Date  05/04/19  Interventions for Short Term  Goal #4  RN discussed with patient about chair exercises to build up stamina. Per patient she will look on her television and see if there are any exercise classes. RN will follow up within further discussion and monitoring for compliance.      Assessment:  Patient has received COVID vaccine Patient is on Oxygen continuously Per patient she is taking medications as prescribed Patient has shortness of breath on exertion Patient does not exercise Patient has not had a colonoscopy (brother has colon cancer) Patient up to date with eye exam Patient has received educational material sent by RN Health Coach Plan:  RN discussed adhering to diet RN discussed chair exercises RN discussed Health maintenance RN discussed fall prevention RN discussed COPD zones and action plan RN will follow up within the month of July  Khush Pasion [place] [facility] [phone]

## 2019-05-06 ENCOUNTER — Ambulatory Visit (INDEPENDENT_AMBULATORY_CARE_PROVIDER_SITE_OTHER): Payer: Medicare Other | Admitting: Internal Medicine

## 2019-05-06 ENCOUNTER — Encounter: Payer: Self-pay | Admitting: Internal Medicine

## 2019-05-06 ENCOUNTER — Other Ambulatory Visit: Payer: Self-pay

## 2019-05-06 VITALS — BP 130/88 | HR 90 | Ht 67.0 in | Wt 298.0 lb

## 2019-05-06 DIAGNOSIS — E785 Hyperlipidemia, unspecified: Secondary | ICD-10-CM | POA: Diagnosis not present

## 2019-05-06 DIAGNOSIS — E1122 Type 2 diabetes mellitus with diabetic chronic kidney disease: Secondary | ICD-10-CM | POA: Diagnosis not present

## 2019-05-06 DIAGNOSIS — N182 Chronic kidney disease, stage 2 (mild): Secondary | ICD-10-CM | POA: Diagnosis not present

## 2019-05-06 LAB — POCT GLYCOSYLATED HEMOGLOBIN (HGB A1C): Hemoglobin A1C: 6.2 % — AB (ref 4.0–5.6)

## 2019-05-06 MED ORDER — ACCU-CHEK GUIDE VI STRP: ORAL_STRIP | 12 refills | Status: DC

## 2019-05-06 NOTE — Patient Instructions (Addendum)
Please continue: - Metformin ER 1000 mg with dinner  Stop chocolate. No fried food.  Please return in 6 months with your sugar log.

## 2019-05-06 NOTE — Addendum Note (Signed)
Addended by: Cardell Peach I on: 05/06/2019 04:14 PM   Modules accepted: Orders

## 2019-05-06 NOTE — Progress Notes (Signed)
Patient ID: [name], female   DOB: 1960/07/21, 59 y.o.   MRN: [id number]   This visit occurred during the SARS-CoV-2 public health emergency.  Safety protocols were in place, including screening questions prior to the visit, additional usage of staff PPE, and extensive cleaning of exam room while observing appropriate contact time as indicated for disinfecting solutions.   HPI: [name] is a 59 y.o.-year-old female, self-referred by her, for management of Type 2 (vs. steroid-induced) diabetes, dx in 2012, non-insulin-dependent, fairly well controlled, with complications (CKD,  CAD with diastolic dysfunction, h/o TIA). She previously saw Dr. Loanne Garrison.  Last visit with me 6 months ago.  Patient was previously on insulin between 2013 and 2016 probably due to prednisone treatment for sarcoidosis.  She is now off the insulin with controlled diabetes.  Since last OV, she started to eat fried food >> now stopped. She also eats chocolate every night.  Reviewed HbA1c levels Lab Results  Component Value Date   HGBA1C 5.7 (A) 11/03/2018   HGBA1C 6.4 04/07/2018   HGBA1C 5.9 (H) 02/05/2018   HGBA1C 5.2 10/28/2017   HGBA1C 5.8 (H) 06/26/2017   HGBA1C 6.7 (H) 04/02/2017   HGBA1C 7.1 (H) 07/22/2016   HGBA1C 5.9 01/30/2016   HGBA1C 5.8 (H) 07/04/2015   HGBA1C 6.0 04/03/2015   HGBA1C 5.8 02/01/2015   HGBA1C 6.2 06/28/2014   HGBA1C 6.8 (H) 03/24/2014   HGBA1C 6.8 (H) 08/02/2013   HGBA1C 6.4 04/27/2013   HGBA1C 7.3 (H) 01/27/2013   HGBA1C 7.1 (H) 09/17/2012   HGBA1C 6.7 (H) 06/02/2012   HGBA1C 6.1 03/03/2012   HGBA1C 6.1 11/26/2011   Pt is on a regimen of: - Metformin ER 1000 mg in a.m. >. With dinner She was previously on Januvia 50 mg, stopped at 10/2017  She is not checking sugars as her meter is broken. From last visit: - am: 120-125 >> 115-124 >> 110, 115-145, 150, 163 >> ? - 2h after b'fast: n/c >> 154 >> n/c - before lunch: n/c - 2h after lunch: n/c  -  before dinner: n/c >> 125-130 - 2h after dinner: n/c - bedtime: 135-140 >> 135-150 >> n/c - nighttime: n/c Lowest sugar was 105 >> 115 >> 115 >> ?; it is unclear at which level she has hypoglycemia awareness. Highest sugar was 150 >> 154 >> 163 >> ?[name]  Glucometer: AccuChek guide me Pt's meals are: - Breakfast: watermelon; cereal + 2% milk; coffee; apple sauce or peach cup; or grits - Lunch: may skip; sandwich or soup - Dinner: cereal or sandwich or baked chicken + veggies (greens, corn) + rice or spaghetti >> salmon/organic chicken breast + broccoli/green beans + starch - Snacks: >> chocolate  + Improved CKD, last BUN/creatinine:  Lab Results  Component Value Date   BUN 18 08/27/2018   BUN 17 04/07/2018   CREATININE 0.90 08/27/2018   CREATININE 0.91 04/07/2018   + HL; last set of lipids: Lab Results  Component Value Date   CHOL 220 (H) 08/27/2018   HDL 88 08/27/2018   LDLCALC 111 (H) 08/27/2018   LDLDIRECT 107.0 06/30/2017   TRIG 107 08/27/2018   CHOLHDL 2.5 08/27/2018  Not on a statin as she developed transaminitis and pruritus on these. - last eye exam in 09/2018: No DR.  She had an eye infection in 06/2017 >> had sx. She could not afford the co-pays in the past. -She denies numbness and tingling in her feet.  At last visit she had pain in  both feet so I referred her to podiatry.  She saw the podiatrist.  Pt has FH of DM in mother, sister.  She has lichen planus on legs. Getting inj's.  She was admitted with transaminitis 09/2017 along with RUQ pain and pruritus.  She had lap cholecystectomy 02/11/2018.  Symptoms resolved since then.t  ROS: Constitutional: + weight gain/no weight loss, no fatigue, no subjective hyperthermia, no subjective hypothermia Eyes: no blurry vision, no xerophthalmia ENT: no sore throat, no nodules palpated in neck, no dysphagia, no odynophagia, no hoarseness Cardiovascular: no CP/+ SOB/no palpitations/no leg swelling Respiratory: no cough/+  SOB/no wheezing Gastrointestinal: no N/no V/no D/no C/no acid reflux Musculoskeletal: + Muscle aches/+ joint aches Skin: no rashes, + hair loss Neurological: no tremors/no numbness/no tingling/no dizziness, + headaches  I reviewed pt's medications, allergies, PMH, social hx, family hx, and changes were documented in the history of present illness. Otherwise, unchanged from my initial visit note.  Constitutional: no weight gain/no weight loss, no fatigue, no subjective hyperthermia, no subjective hypothermia Eyes: no Blurry vision, no xerophthalmia ENT: no sore throat, no nodules palpated in neck, no dysphagia, no odynophagia, no hoarseness Cardiovascular: no CP/+ SOB/no palpitations/no leg swelling Respiratory: no cough/+ SOB/no wheezing Gastrointestinal: no N/no V/no D/no C/no acid reflux Musculoskeletal: + Muscle aches/+ joint aches Skin: no rashes, + hair loss Neurological: no tremors/no numbness/no tingling/no dizziness, + HA  I reviewed pt's medications, allergies, PMH, social hx, family hx, and changes were documented in the history of present illness. Otherwise, unchanged from my initial visit note.  Past Medical History:  Diagnosis Date  . Allergic rhinitis   . ALLERGIC RHINITIS 10/26/2009  . Anxiety   . ANXIETY 08/14/2006  . B12 DEFICIENCY 08/25/2007  . Complication of anesthesia    pt has had difficulty following anesthesia with her knee in 2016-unable to care for herself afterward  . Confusion   . Depression    takes Lexapro daily  . Diabetes mellitus without complication ([place])    was on insulin but has been off since Nov 2015 and now only takes Metformin daily  . DYSPNEA 04/28/2009   with exertion  . Esophageal reflux    takes Nexium daily  . Fibromyalgia   . Headache    last migraine 2-76yr ago;takes Topamax daily  . History of shingles   . Hypertension    takes Coreg daily  . Insomnia    takes Nortriptyline nightly   . Joint pain   . Joint swelling   . Left  knee pain   . Lichen planus   . Long-term memory loss   . Nocturia   . OSA (obstructive sleep apnea)    doesn't use CPAP;sleep study in epic from 2006  . Osteoarthritis   . Osteoarthritis   . Pneumonia    over 30 yrs ago  . [place]   . [place]umatoid [place] ([place]   . Sarcoidosis    Dr. ZLolita Patella . Short-term memory loss   . Shortness of breath   . Sleep apnea     wears oxygen  . TIA (transient ischemic attack)   . Unsteady gait   . Urinary urgency   . Vitamin D deficiency    is supposed to take Vit D but can't afford it  . VITAMIN D DEFICIENCY 08/25/2007   Past Surgical History:  Procedure Laterality Date  . APPENDECTOMY    . arthroscopic knee surgery Right 11-12-04  . AXILLARY ABCESS IRRIGATION AND DEBRIDEMENT  Jul & Aug2012  .  CARPAL TUNNEL RELEASE Left 05/23/2014   Procedure: CARPAL TUNNEL RELEASE;  Surgeon: Meredith Pel, MD;  Location: [place];  Service: Orthopedics;  Laterality: Left;  . CHOLECYSTECTOMY N/A 02/11/2018   Procedure: LAPAROSCOPIC CHOLECYSTECTOMY WITH INTRAOPERATIVE CHOLANGIOGRAM ERAS PATHWAY;  Surgeon: Donnie Mesa, MD;  Location: [facility];  Service: General;  Laterality: N/A;  . cyst removed from top of buttocks  at age 68  . ENDOMETRIAL ABLATION    . IR CHOLANGIOGRAM EXISTING TUBE  11/21/2017  . IR CHOLANGIOGRAM EXISTING TUBE  01/16/2018  . IR EXCHANGE BILIARY DRAIN  11/10/2017  . IR PATIENT EVAL TECH 0-60 MINS  02/03/2018  . IR PERC CHOLECYSTOSTOMY  09/13/2017  . IR RADIOLOGIST EVAL & MGMT  10/08/2017  . LACRIMAL DUCT EXPLORATION Right 06/26/2017   Procedure: LACRIMAL DUCT EXPLORATION AND ETHMOIDECTOMY;  Surgeon: Clista Bernhardt, MD;  Location: [facility];  Service: Ophthalmology;  Laterality: Right;  . TEAR DUCT PROBING Right 06/26/2017   Procedure: TEAR DUCT PROBING WITH STENT;  Surgeon: Clista Bernhardt, MD;  Location: [facility];  Service: Ophthalmology;  Laterality: Right;  . TOTAL KNEE ARTHROPLASTY Right 11/15/2014   Procedure:  TOTAL RIGHT KNEE ARTHROPLASTY;  Surgeon: Meredith Pel, MD;  Location: [place] [place];  Service: Orthopedics;  Laterality: Right;  . TOTAL KNEE ARTHROPLASTY Left 07/13/2015   Procedure: LEFT TOTAL KNEE ARTHROPLASTY;  Surgeon: Meredith Pel, MD;  Location: [facility];  Service: Orthopedics;  Laterality: Left;   Social History   Socioeconomic History  . Marital status: Single    Spouse name: Not on file  . Number of children: 1  . Years of education: Not on file  . Highest education level: Not on file  Occupational History  . Occupation: disabled  Tobacco Use  . Smoking status: Former Smoker    Packs/day: 0.50    Years: 10.00    Pack years: 5.00    Types: Cigarettes  . Smokeless tobacco: Never Used  . Tobacco comment: quit smoking in 2004  Substance and Sexual Activity  . Alcohol use: Not Currently    Alcohol/week: 1.0 standard drinks    Types: 1 Glasses of wine per week  . Drug use: No  . Sexual activity: Not Currently  Other Topics Concern  . Not on file  Social History Narrative   Single, broke up with partner in 2008.      Lives at home alone   Caffeine: stopped 2009   Social Determinants of Health   Financial Resource Strain:   . Difficulty of Paying Living Expenses:   Food Insecurity: No Food Insecurity  . Worried About Charity fundraiser in the Last Year: Never true  . Ran Out of Food in the Last Year: Never true  Transportation Needs: Unmet Transportation Needs  . Lack of Transportation (Medical): Yes  . Lack of Transportation (Non-Medical): Yes  Physical Activity:   . Days of Exercise per Week:   . Minutes of Exercise per Session:   Stress:   . Feeling of Stress :   Social Connections:   . Frequency of Communication with Friends and Family:   . Frequency of Social Gatherings with Friends and Family:   . Attends Religious Services:   . Active Member of Clubs or Organizations:   . Attends Archivist Meetings:   [name] Marital Status:   Intimate Partner  Violence:   . Fear of Current or Ex-Partner:   . Emotionally Abused:   [name] Physically Abused:   . Sexually Abused:  Current Outpatient Medications on File Prior to Visit  Medication Sig Dispense Refill  . albuterol (VENTOLIN HFA) 108 (90 Base) MCG/ACT inhaler Inhale 2 puffs into the lungs every 6 (six) hours as needed for wheezing or shortness of breath. 8 g 4  . B-D ULTRA-FINE 33 LANCETS MISC Use as instructed to test sugars once  daily 100 each 12  . Blood Glucose Monitoring Suppl (ONETOUCH VERIO) w/Device KIT Use to check blood sugar once a day 1 kit 0  . budesonide-formoterol (SYMBICORT) 160-4.5 MCG/ACT inhaler Inhale 2 puffs into the lungs TWICE DAILY 10.2 g 5  . carvedilol (COREG) 12.5 MG tablet Take 1 tablet (12.5 mg total) by mouth 2 (two) times daily with a meal. 180 tablet 3  . cefUROXime (CEFTIN) 250 MG tablet Take 1 tablet (250 mg total) by mouth 2 (two) times daily for 10 days. 14 tablet 0  . clonazePAM (KLONOPIN) 0.5 MG tablet Take 1 TO 2 tablets (0.5-1 mg total) by mouth at bedtime as needed for anxiety. 60 tablet 3  . DEXILANT 60 MG capsule Take 1 capsule (60 mg total) by mouth daily. 90 capsule 2  . diclofenac Sodium (VOLTAREN) 1 % GEL Apply 2 g topically 2 (two) times daily as needed (for pain). 100/4=25    . furosemide (LASIX) 40 MG tablet TAKE 1 TABLET BY MOUTH EVERY MORNING FOR EDEMA (Patient taking differently: Take 40 mg by mouth daily as needed for fluid or edema. ) 90 tablet 1  . gabapentin (NEURONTIN) 300 MG capsule Take 1 capsule (300 mg total) by mouth 2 (two) times daily as needed (for pain). (Patient taking differently: Take 300 mg by mouth 2 (two) times daily. ) 180 capsule 1  . glucose blood (ONETOUCH VERIO) test strip Use as instructed to check blood sugar once a day. 100 each 12  . HUMIRA PEN 40 MG/0.4ML PNKT SMARTSIG:40 Milligram(s) SUB-Q Once a Week    . hydrOXYzine (ATARAX/VISTARIL) 25 MG tablet Take 1 tablet (25 mg total) by mouth every 8 (eight) hours as  needed for itching. 60 tablet 1  . metFORMIN (GLUCOPHAGE-XR) 500 MG 24 hr tablet TAKE 2 TABLETS(1000 MG) BY MOUTH DAILY WITH BREAKFAST (Patient taking differently: Take 1,000 mg by mouth at bedtime. TAKE 2 TABLETS(1000 MG) BY MOUTH DAILY) 180 tablet 2  . naltrexone (DEPADE) 50 MG tablet Split tablets into quarters. Take one-quarter of a tablet once daily    . OXYGEN Inhale into the lungs. Takes 2 liters every day (24/7)    . potassium chloride (KLOR-CON) 10 MEQ tablet Take 1 tablet (10 mEq total) by mouth daily. 90 tablet 0  . SUPREP BOWEL PREP KIT 17.5-3.13-1.6 GM/177ML SOLN SMARTSIG:1 Kit(s) By Mouth Once    . topiramate (TOPAMAX) 50 MG tablet Take 1 tablet (50 mg total) by mouth 2 (two) times daily. 180 tablet 3  . UNABLE TO FIND Use as instructed to check blood sugar once a day.    . venlafaxine XR (EFFEXOR-XR) 75 MG 24 hr capsule Take 1 capsule (75 mg total) by mouth daily with breakfast. 30 capsule 11  . XARELTO 20 MG TABS tablet Take 1 tablet (20 mg total) by mouth at bedtime. 90 tablet 3  . [DISCONTINUED] clonazePAM (KLONOPIN) 0.5 MG tablet Take 1 TO 2 tablets (0.5-1 mg total) by mouth at bedtime as needed for anxiety. (Patient taking differently: Take 0.5 mg by mouth at bedtime. ) 60 tablet 3   No current facility-administered medications on file prior to visit.   Allergies  Allergen Reactions  . Belsomra [Suvorexant] Other (See Comments)    [place] out of bed while asleep: "I'm waking up as I'm falling on the floor;" "Night terrors"  . Enalapril Maleate Cough  . Latex Hives  . Nickel Other (See Comments)    Blisters  Pt has a titanium right and left knee - nickel causes skin irritations that form into blisters and sores  . Other Other (See Comments)    Patient has Sarcoidosis and can't tolerate any metals  . Morphine And Related Itching and Nausea Only  . Doxycycline Diarrhea and Nausea And Vomiting  . Hydrochlorothiazide Other (See Comments)    Low potassium levels   .  Hydroxychloroquine Sulfate Other (See Comments)    Vision changes   . Lyrica [Pregabalin] Other (See Comments)    "Made me feel high"  . Tape Other (See Comments)    Medical tape causes bruising   Family History  Problem Relation Age of Onset  . Heart disease Mother   . Kidney disease Mother        renal failure  . Cancer Father        leukemia  . Hypertension Other   . Coronary artery disease Other        female 1st degree relative <60  . Heart failure Other        congestive  . Coronary artery disease Other        Female 1st degree relative <50  . Breast cancer Other        1st degree relative <50 S  . Breast cancer Sister   . Colon cancer Brother 72  . Stroke Brother   . Heart attack Brother   . Esophageal cancer Neg Hx   . Stomach cancer Neg Hx   . Rectal cancer Neg Hx     PE: BP 130/88   Pulse 90   Ht 5' 7"  (1.702 m)   Wt 298 lb (135.2 kg)   LMP 05/19/2003   SpO2 97%   BMI 46.67 kg/m  Wt Readings from Last 3 Encounters:  05/06/19 298 lb (135.2 kg)  02/01/19 293 lb (132.9 kg)  11/19/18 293 lb (132.9 kg)   Constitutional: overweight, in NAD Eyes: PERRLA, EOMI, no exophthalmos ENT: moist mucous membranes, no thyromegaly, no cervical lymphadenopathy Cardiovascular: RRR, No MRG Respiratory: CTA B Gastrointestinal: abdomen soft, NT, ND, BS+ Musculoskeletal: no deformities, strength intact in all 4 Skin: moist, warm, no rashes Neurological: no tremor with outstretched hands, DTR normal in all 4   ASSESSMENT: 1. DM2, non-insulin-dependent, controlled, with complications - h/o CKD - CAD - cerebrovascular ds - s/p TIA  2. HL  3.  Obesity-class III  PLAN:  1. Patient with longstanding, previously uncontrolled diabetes, now with better control only on Metformin.  At last visit, sugars were close to target but occasionally having high blood sugars in the morning due to eating popcorn every night.  We discussed about decreasing the frequency and the amount of  popcorn she was eating.  At that time, she also gained 30 pounds from the previous visit. -At last visit we moved to Metformin at night to help with blood sugars in the morning.  She was not checking sugars and I advised her to check more frequently. - at this visit, she is not checking her blood sugars but today we gave her a glucometer and she will start doing so.  I advised her to check once a day.  At today's visit, her HbA1c  is 6.2% (higher).  She feels that this is related to eating more fried foods, which she actually stopped 3 weeks ago and also eating more chocolate, which she plans to start.  She also plans to start walking now.  We will not change her regimen at this visit. - I suggested to:  Patient Instructions  Please continue: - Metformin ER 1000 mg with dinner  Stop chocolate. No fried food.  Please return in 6 months with your sugar log.   - advised to check sugars at different times of the day - 1x a day, rotating check times - advised for yearly eye exams >> she is UTD - return to clinic in 6 months  2. HL -Reviewed latest lipid panel from 08/2018: LDL above goal, the rest of the fractions at goal: Lab Results  Component Value Date   CHOL 220 (H) 08/27/2018   HDL 88 08/27/2018   LDLCALC 111 (H) 08/27/2018   LDLDIRECT 107.0 06/30/2017   TRIG 107 08/27/2018   CHOLHDL 2.5 08/27/2018  -She has a history of transaminitis and also pruritus she was reticent about starting a statin in the past  3.  Obesity-class III  -Before last visit, she gained almost 30 pounds - she gained 6 lbs since last OV -We will continue Metformin which is weight stabilizing an appetite suppressant long-term - she stopped fried foods 3 weeks ago. She is still eating chocolate >> will stop.  Philemon Kingdom, MD PhD [facility]

## 2019-05-11 ENCOUNTER — Encounter: Payer: Self-pay | Admitting: Internal Medicine

## 2019-05-11 NOTE — Unmapped (Signed)
[facility] Refill Coordination Note    Specialty Medication(s) to be Shipped:   Inflammatory Disorders: Humira    Other medication(s) to be shipped:       Carolyn Newton, DOB: 1960/09/05  Phone: [phone] (home)       All above HIPAA information was verified with patient.     Was a Nurse, learning disability used for this call? No    Completed refill call assessment today to schedule patient's medication shipment from the [facility] [phone]).       Specialty medication(s) and dose(s) confirmed: Regimen is correct and unchanged.   Changes to medications: Carolyn Newton reports no changes at this time.  Changes to insurance: No  Questions for the pharmacist: No    Confirmed patient received Welcome Packet with first shipment. The patient will receive a drug information handout for each medication shipped and additional FDA Medication Guides as required.       DISEASE/MEDICATION-SPECIFIC INFORMATION        For patients on injectable medications: Patient currently has 1 doses left.  Next injection is scheduled for 04/28.    SPECIALTY MEDICATION ADHERENCE     Medication Adherence    Patient reported X missed doses in the last month: 0  Specialty Medication: Humira 40mg /0.56ml  Patient is on additional specialty medications: No  Informant: patient  Reliability of informant: reliable  Patient is at risk for Non-Adherence: No                Humira 40/0.4 mg/ml: 7 days of medicine on hand         SHIPPING     Shipping address confirmed in Epic.     Delivery Scheduled: Yes, Expected medication delivery date: 04/30.     Medication will be delivered via UPS to the prescription address in Epic WAM.    [name]   [facility] Specialty Technician

## 2019-05-11 NOTE — Assessment & Plan Note (Signed)
Ceftin po US renal

## 2019-05-13 MED FILL — HUMIRA PEN CITRATE FREE 40 MG/0.4 ML: 28 days supply | Qty: 4 | Fill #2

## 2019-05-13 MED FILL — HUMIRA PEN CITRATE FREE 40 MG/0.4 ML: 28 days supply | Qty: 4 | Fill #2 | Status: AC

## 2019-05-20 ENCOUNTER — Other Ambulatory Visit: Payer: Self-pay | Admitting: Internal Medicine

## 2019-05-21 ENCOUNTER — Telehealth: Payer: Self-pay | Admitting: Internal Medicine

## 2019-05-21 NOTE — Telephone Encounter (Signed)
Pt is asking for a new rx be sent to the [facility] on [address] For her lancets and test strips for the accucheck guide me meter.  [facility] is unable to file this rx with medicare Part B and [facility] can so it will be cheaper for the pt

## 2019-05-24 ENCOUNTER — Encounter: Admit: 2019-05-24 | Discharge: 2019-05-25 | Payer: MEDICARE

## 2019-05-24 DIAGNOSIS — D869 Sarcoidosis, unspecified: Secondary | ICD-10-CM | POA: Diagnosis not present

## 2019-05-24 MED ORDER — HYDROXYZINE HCL 25 MG TABLET
ORAL_TABLET | 5 refills | 0 days | Status: CP
Start: 2019-05-24 — End: ?

## 2019-05-24 MED ORDER — NALTREXONE 50 MG TABLET
ORAL_TABLET | 11 refills | 0 days | Status: CP
Start: 2019-05-24 — End: ?

## 2019-05-24 MED ORDER — ADALIMUMAB PEN CITRATE FREE 40 MG/0.4 ML
SUBCUTANEOUS | 11 refills | 28.00000 days | Status: CP
Start: 2019-05-24 — End: ?
  Filled 2019-06-08: qty 4, 28d supply, fill #0

## 2019-05-24 MED ORDER — BD LANCET ULTRAFINE 33G MISC: 12 refills | Status: DC

## 2019-05-24 MED ORDER — ACCU-CHEK GUIDE VI STRP: ORAL_STRIP | 12 refills | Status: AC

## 2019-05-24 NOTE — Unmapped (Signed)
Hidradentis Suppurativa (pronounced ???high-drad-en-eye-tis/sup-your-uh-tee-vah???) is a chronic disease of hair follicles.  The lesions occur most commonly on areas of skin-to-skin contact: under the arms (axillary area), in the groin, around the buttocks, in the region around the anus and genitals, and on the skin between and under the breasts. In women, the underarms, groin, and breast areas are most commonly affected. Men most often have HS lesions around the anus and under the arms and may also have HS at the back of the neck and behind and around the ears.    What does HS look and feel like?   The first thing that someone with HS notices is a tender, raised, red bump that looks like an under-the-skin pimple or boil. Sometimes HS lesions have two or more ???heads.???  In mild disease only an occasional boil or abscess may occur, but in more active disease there can be many new lesions every month.  Some abscesses can become larger and may open and drain pus.  Bleeding and increased odor can also occur. In severe disease, deeper abscesses develop and may connect with each other under the skin to form tunnel-like tracts (sinuses, fistulas).  These may drain constantly, or may temporarily improve and then usually begin draining again over time.  In people who have had sinus tracts for some time, scars form that feel like ropes under the skin. In the very worst cases, networks of sinus tracts can form deeper in the body, including the muscle and other tissues. Many people with severe HS have scars that can limit their ability to freely move their arms or legs, though this is very unlikely for most patients.     Clinicians usually classify or ???grade??? HS using the [facility] staging system according to the severity of the disease for each body location:   Charco stage I: one or more abscesses are present, but no sinus tracts have formed and no scars have developed   Doreene Adas stage II: one or more abscesses are present that resolve and recur; on sinus tract can be present and scarring is seen   Doreene Adas stage III: many abscesses and more than one sinus tract is present with extensive scars.    What causes HS?  The cause of HS is not completely understood.  It seems to be a disorder of hair follicles and often many family members are affected so genetics probably play a strong role.  Bacteria are often present and may make the disease worse, but infection does not seem to be the main cause. Hormones are also likely play a role since the condition typically starts around puberty when hair follicles under the arms and in the groin start to change.  It can sometimes flare with menstrual cycles in women as well.  In most cases it lasts for decades and starts to improve to some extent in the late 30s and 40s as long as many fistulas have not already formed.  Women are three times more likely than men to develop HS.    Other factors are known to contribute to HS flaring or becoming worse, though they are likely not the main causes. The factors most commonly associated with HS include:   Cigarette smoking - this is very highly linked.  Stopping smoking will likely not cure the disease, but likely is helpful in reducing how much and how often it flares.   Obesity - HS may occur even in people that are not overweight, but it is much more common  in patients that are.  There is some evidence that losing weight and eating a diet low in sugars and fats may be helpful in improving hidradenitis, though this is not helpful for everyone.  Working with a nutritionist may be an important way to help with this and is something your physician can help coordinate    Hidradenitis is not contagious.  It is not caused by a problem with personal hygiene or any other activity or behavior of those with the disease.    How can your doctor help you treat your hidradenitis?  Clinicians use both medication and surgery to treat HS. The choice of treatment--or combination of treatments--is made according to an individual patient???s needs. Clinicians consider several factors in determining the most appropriate plan for therapy:   Severity of disease - medications and some laser treatments are usually able to control disease best when fistulas are not present.  Fistulas typically require surgery.   Extent and location of disease   Chronicity (how often the lesions recur)    A number of different surgical methods have been developed that are useful for certain patients under particular circumstances. These can be done with local numbing and healing at home for some areas when disease is not too extensive with relatively brief recovery times.  In more extensive disease there may be a need for larger excisions under general anesthesia with healing time in the hospital and prolonged recovery periods for better disease control.      In addition, many medical treatments have been tried--some with more success than others. No medication is effective for all patients, and you and your doctor may have to try several different agents or combinations of agents before you find the treatment plan that works best for you.  The goals of therapy with medications that are either topical (used on the skin) or systemic (taken by mouth) are:  1. to clear the lesions or at least reduce their number and extent, and  2. to prevent new lesions from forming.  3. To reduce pain, drainage, and odor  Some of the types of medications commonly used are antibacterial skin washes and the topical antibiotics to prevent secondary infections and corticosteroid injections into the lesions to reduce inflammation.     Other medications that may be used include retinoids (similar to Accutane), drugs that effect how hormones and hair follicles interact, drugs that affect your immune system (such as methotrexate, adalimumab/Humira, and Remicaid/infliximab), steroids, and oral antibiotics.    Lasers that destroy hair follicles can also be helpful since they reduce the hair follicles that cause the problems.  Multiple treatments are typically required over time and there is some discomfort associated with treatment, but it is typically very fast and well-tolerated.    It is very important to realize that hidradenitis cannot be completely cured with any single medication or surgical procedure.  It is a disease that can be very stubborn and difficult to control, but with good treatment a lot of improvement and sometimes temporary remissions can be obtained. Poorly controlled disease can cause more fistulas to form and make managing the disease much more difficult over time so it is important to seek care to reduce major flares.  Surgery can provide a long term cure in some areas, though the disease can start again or continue in nearby areas.  A dermatologist is often the best person to help coordinate disease treatment, and sometimes other surgeons, pain specialists, other specialists, and nutritionists may be  part of the treatment team.    What can you do to help your HS?  1. If your are a smoker, then stopping can probably be helpful.  Your dermatologist will be happy to refer you to some one who can help with this.  2. Follow a healthy diet and try to achieve a healthy weight  Some other self-help measures are:   Keep your skin cool and dry (becoming overheated and sweating can contribute to an HS flare)   To reduce the pain of cysts or nodules or to help them to drain, apply hot compresses or soak in hot water for 10 minutes at a time (use a clean washcloth or a teabag soaked in hot water)   For female patients, cotton underwear that does not have tight elastic in the groin can be helpful.  Boyshort, brief, or boxer style underwear may be a better option as friction on hair follicles in affected areas can be a major trigger in some patients.  These can be easily found on [place] or with some retailers.  Fruit of the Loom and Underworks are two brands that are sometimes recommended.    Finally, know that you are not alone. Coping with the pain and other symptoms of HS can be very difficult, so it may be helpful to connect with others who live with HS. Patient groups and networks can be sources of important information and support. Some internet resources for information and connections are provided below.  Resources for Information    The Hidradenitis Suppurativa Foundation: A nonprofit organized by a group of physicians interested in treating and advancing research in hidradenitis suppurativa    American Academy of Dermatology  ARanked.fi    [employer]  ElevatorPitchers.de.html  [place]: [employer]  https://www.rarediseases.org/rare-disease-information/rare-diseases/byID/358/viewAbstract  Trials of new medications for HS  https://www.clinicaltrials.gov

## 2019-05-24 NOTE — Unmapped (Signed)
ASSESSMENT/PLAN:  Sarcoidosis, cutaneous and pulmonary   1. Dr. Vassie Loll (pulmonary) in the loop, continue adalimumab 40mg  weekly (started eow 06/2016, increased to weekly 09/2016)  2. 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  3. Has had ophtho screening in the past, following with cardiologist and pulmonologist  -quant gold negative 07/2017, repeat today    Lichen planus  -After the patient was informed of risks, benefits and side effects of intralesional steroid injection, the patient elected to undergo injection. Informed verbal consent was obtained. Risk of atrophy  and dyspigmentation with injection was explained. Kenalog 10 mg/ml was injected locally into the sites located on the thighs, feet, hands in a clean fashion following alcohol prep.   Total volume in ml=2.  Number of sites treated: >7   Wound care was explained to the patient     Pruritus: She can continue hydroxyzine 25 mg tablet take 1/4 in the morning and 1/2 in the evening as needed.  Continue naltrexone 12.5mg  (quartering 50mg  tablets) once daily    RTC: 12 weeks    SUBJECTIVE:  CC:  Carolyn Newton is a 59 y.o. female  who presents for follow-up of sarcoidosis  She started adalimumab (80mg  loading, 40mg  eow maintenance) in 06/2016, increased to 40mg  weekly 09/2016 overall been doing well on this regimen.  Notes some tenderness at an old plaque on the L upper arm.    She has continued to have significant itching from her lichen planus on her thighs and feet. Clobetasol ointment and cream not helpful in the past.  ILK was quite helpful at the last visit and it just started coming back in a few areas in the last month.     Diffuse pruritis somewhat better with hydroxyzine 25mg  tablets taking 1/4 in the morning and 1/2 in the evening - this dose keeps her from being too drowsy. Also does 12.5mg  of naltrexone in the evenings.  This seems to help overall.    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  Many years on and off of prednisone with temporary improvement, but overall thinks she is worse over time.  Has had a large amount of weight gain in the last year since restarting prednisone in April 2016.  Had come off recently, but now on Medrol dose pack from her PCP.  Was on methotrexate given worsening cutaneous involvement of the scalp.  Dose was 10mg  taken 2 doses 12 hours apart in winter 2017-18.  Did that for 3 months, but stopped because she thought it wasn't helping.  Had been on hydroxychloroquine many years ago, but it was not very helpful.  Has been on doxycycline many times over the years without much improvement.     Followed by Dr. Vassie Loll for pulmonary sarcoidosis and we have discussed the treatment plan.    ROS: The balance of 10 systems is negative unless otherwise documented..      OBJECTIVE:     Gen: Well appearing, well nourished female in no acute distress. Alert and oriented x 3  Exam: Exam of the scalp, face, eyelids, lips, neck, chest, abdomen, back, bilateral upper and lower extremities, palms, soles, and nails was performed and notable for:  1. Alopecic patches on the frontal and occipital scalp, atrophic soft plaque on L upper arm.  Face is clear  2. Moderate diffuse thinning of hair on most of the scalp  3. Violaceous patches with gray-white accentuation of skin  tension lines on the ankles, bilateral medial thighs, feet.  -sites not commented on demonstrate normal findings.

## 2019-05-24 NOTE — Telephone Encounter (Signed)
RX's have been sent.

## 2019-05-24 NOTE — Addendum Note (Signed)
Addended by: Cardell Peach I on: 05/24/2019 01:41 PM   Modules accepted: Orders

## 2019-05-25 ENCOUNTER — Other Ambulatory Visit: Payer: Self-pay | Admitting: Internal Medicine

## 2019-05-25 DIAGNOSIS — Z1231 Encounter for screening mammogram for malignant neoplasm of breast: Secondary | ICD-10-CM

## 2019-05-25 LAB — TB AG2 VALUE: Lab: 0.08

## 2019-05-25 LAB — QUANTIFERON TB GOLD PLUS
QUANTIFERON ANTIGEN 2 MINUS NIL: 0.04 [IU]/mL
QUANTIFERON TB NIL VALUE: 0.04 [IU]/mL

## 2019-05-25 LAB — TB AG1 VALUE: Lab: 0.06

## 2019-05-25 LAB — TB NIL VALUE: Lab: 0.04

## 2019-05-25 LAB — TB MITOGEN VALUE: Lab: 7.91

## 2019-05-25 LAB — QUANTIFERON MITOGEN: Lab: 7.87

## 2019-05-28 ENCOUNTER — Other Ambulatory Visit: Payer: Medicare Other

## 2019-06-03 ENCOUNTER — Ambulatory Visit
Admission: RE | Admit: 2019-06-03 | Discharge: 2019-06-03 | Disposition: A | Discharge status: Home / Self Care | Payer: Medicare Other | Source: Ambulatory Visit | Attending: Internal Medicine | Admitting: Internal Medicine

## 2019-06-03 DIAGNOSIS — N281 Cyst of kidney, acquired: Secondary | ICD-10-CM | POA: Diagnosis not present

## 2019-06-03 DIAGNOSIS — N10 Acute tubulo-interstitial nephritis: Secondary | ICD-10-CM

## 2019-06-03 NOTE — Unmapped (Signed)
[facility] Refill Coordination Note    Specialty Medication(s) to be Shipped:   Inflammatory Disorders: Humira    Other medication(s) to be shipped:       Rudi Coco, DOB: [birth year]  Phone: [phone] (home)       All above HIPAA information was verified with patient.     Was a Nurse, learning disability used for this call? No    Completed refill call assessment today to schedule patient's medication shipment from the [facility] [phone]).       Specialty medication(s) and dose(s) confirmed: Regimen is correct and unchanged.   Changes to medications: Dexter reports no changes at this time.  Changes to insurance: No  Questions for the pharmacist: No    Confirmed patient received Welcome Packet with first shipment. The patient will receive a drug information handout for each medication shipped and additional FDA Medication Guides as required.       DISEASE/MEDICATION-SPECIFIC INFORMATION        For patients on injectable medications: Patient currently has 2 doses left.  Next injection is scheduled for 06/09/2019.    SPECIALTY MEDICATION ADHERENCE     Medication Adherence    Patient reported X missed doses in the last month: 0  Specialty Medication: Humira CF 40 mg/0.4 ml  Patient is on additional specialty medications: No  Any gaps in refill history greater than 2 weeks in the last 3 months: no  Demonstrates understanding of importance of adherence: yes  Informant: patient  Reliability of informant: reliable  Confirmed plan for next specialty medication refill: delivery by pharmacy  Refills needed for supportive medications: not needed                Humira 40/0.4 mg/ml: 14 days of medicine on hand         SHIPPING     Shipping address confirmed in Epic.     Delivery Scheduled: Yes, Expected medication delivery date: 06/09/2019.     Medication will be delivered via UPS to the prescription address in Epic WAM.    Nakaila Freeze D Judie Hollick   [facility] Specialty Technician

## 2019-06-08 MED FILL — HUMIRA PEN CITRATE FREE 40 MG/0.4 ML: 28 days supply | Qty: 4 | Fill #0 | Status: AC

## 2019-06-15 ENCOUNTER — Other Ambulatory Visit: Payer: Self-pay | Admitting: Internal Medicine

## 2019-06-18 ENCOUNTER — Other Ambulatory Visit: Payer: Self-pay | Admitting: Internal Medicine

## 2019-06-23 ENCOUNTER — Telehealth: Payer: Self-pay | Admitting: Internal Medicine

## 2019-06-23 ENCOUNTER — Ambulatory Visit: Payer: Medicare Other

## 2019-06-23 MED ORDER — GABAPENTIN 300 MG PO CAPS: 300.0000 mg | ORAL_CAPSULE | Freq: Two times a day (BID) | ORAL | 0 refills | Status: DC | PRN

## 2019-06-23 NOTE — Telephone Encounter (Signed)
New message:   1.Medication Requested: gabapentin (NEURONTIN) 300 MG capsule 2. Pharmacy (Name, Street, [place]): [place], [place] 3. On Med List: yes  4. Last Visit with PCP: 05/04/19  5. Next visit date with PCP: None   Agent: Please be advised that RX refills may take up to 3 business days. We ask that you follow-up with your pharmacy.

## 2019-06-23 NOTE — Telephone Encounter (Signed)
Refill sent. See meds.  

## 2019-06-24 ENCOUNTER — Other Ambulatory Visit: Payer: Self-pay | Admitting: Internal Medicine

## 2019-07-01 ENCOUNTER — Telehealth: Payer: Self-pay | Admitting: Internal Medicine

## 2019-07-01 NOTE — Telephone Encounter (Signed)
    Patient calling to report left side stomach pain, stomach cramps and diarrhea x 2 weeks. Patient scheduled for 6/22 ; declined sooner appointment due to transportation. Call transferred to [facility] for immediate advice

## 2019-07-02 NOTE — Telephone Encounter (Signed)
Noted  

## 2019-07-02 NOTE — Telephone Encounter (Signed)
Patient spoke with [facility] on 07/01/2019 3:53:59 PM and states  severe left side abdominal cramping and diarrhea for the last 2 weeks. She thought she had a virus. She has sarcoidosis. She is "living off of [employer]."  Advised to see PCP within 24 hours.  Patient scheduled for 07/06/2019.

## 2019-07-05 NOTE — Progress Notes (Deleted)
Date:  07/05/2019   ID:  [name], DOB October 19, 1960, MRN [id number]   PCP:  Cassandria Anger, MD  Cardiologist:  Jenkins Rouge, MD  Electrophysiologist:  None   Evaluation Performed:  Follow-Up Visit  Chief Complaint:    History of Present Illness:    [name] is a 59 y.o. 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 Frequent flairs of respiratory distress requiring antibiotics and steroids Also on 2L oxygen [place] chest stable on 12/26/17 Previous smoker but not now.   Had percutaneous GB drain placed August 2019 and finally removed 02/11/18   TTE reviewed from 11/21/16 EF 60-65% no significant valve disease or signs of pulmonary HTN  ***  The patient does not have symptoms concerning for COVID-19 infection (fever, chills, cough, or new shortness of breath).    Past Medical History:  Diagnosis Date  . Allergic rhinitis   . ALLERGIC RHINITIS 10/26/2009  . Anxiety   . ANXIETY 08/14/2006  . B12 DEFICIENCY 08/25/2007  . Complication of anesthesia    pt has had difficulty following anesthesia with her knee in 2016-unable to care for herself afterward  . Confusion   . Depression    takes Lexapro daily  . Diabetes mellitus without complication ([place])    was on insulin but has been off since Nov 2015 and now only takes Metformin daily  . DYSPNEA 04/28/2009   with exertion  . Esophageal reflux    takes Nexium daily  . Fibromyalgia   . Headache    last migraine 2-35yrs ago;takes Topamax daily  . History of shingles   . Hypertension    takes Coreg daily  . Insomnia    takes Nortriptyline nightly   . Joint pain   . Joint swelling   . Left knee pain   . Lichen planus   . Long-term memory loss   . Nocturia   . OSA (obstructive sleep apnea)    doesn't use CPAP;sleep study in epic from 2006  . Osteoarthritis   . Osteoarthritis   . Pneumonia      over 30 yrs ago  . Protein calorie malnutrition ([place])   . Rheumatoid arthritis ([place])   . Sarcoidosis    Dr. Lolita Patella  . Short-term memory loss   . Shortness of breath   . Sleep apnea     wears oxygen  . TIA (transient ischemic attack)   . Unsteady gait   . Urinary urgency   . Vitamin D deficiency    is supposed to take Vit D but can't afford it  . VITAMIN D DEFICIENCY 08/25/2007   Past Surgical History:  Procedure Laterality Date  . APPENDECTOMY    . arthroscopic knee surgery Right 11-12-04  . AXILLARY ABCESS IRRIGATION AND DEBRIDEMENT  Jul & Aug2012  . CARPAL TUNNEL RELEASE Left 05/23/2014   Procedure: CARPAL TUNNEL RELEASE;  Surgeon: Meredith Pel, MD;  [place];  Service: Orthopedics;  Laterality: Left;  . CHOLECYSTECTOMY N/A 02/11/2018   Procedure: LAPAROSCOPIC CHOLECYSTECTOMY WITH INTRAOPERATIVE CHOLANGIOGRAM ERAS PATHWAY;  Surgeon: Donnie Mesa, MD;  Location: [place];  Service: General;  Laterality: N/A;  . cyst removed from top of buttocks  at age 88  . ENDOMETRIAL ABLATION    . IR CHOLANGIOGRAM EXISTING TUBE  11/21/2017  . IR CHOLANGIOGRAM EXISTING TUBE  01/16/2018  . IR EXCHANGE BILIARY DRAIN  11/10/2017  .  IR PATIENT EVAL TECH 0-60 MINS  02/03/2018  . IR PERC CHOLECYSTOSTOMY  09/13/2017  . IR RADIOLOGIST EVAL & MGMT  10/08/2017  . LACRIMAL DUCT EXPLORATION Right 06/26/2017   Procedure: LACRIMAL DUCT EXPLORATION AND ETHMOIDECTOMY;  Surgeon: Clista Bernhardt, MD;  Location: [place];  Service: Ophthalmology;  Laterality: Right;  . TEAR DUCT PROBING Right 06/26/2017   Procedure: TEAR DUCT PROBING WITH STENT;  Surgeon: Clista Bernhardt, MD;  Location: [place];  Service: Ophthalmology;  Laterality: Right;  . TOTAL KNEE ARTHROPLASTY Right 11/15/2014   Procedure: TOTAL RIGHT KNEE ARTHROPLASTY;  Surgeon: Meredith Pel, MD;  Location: [place];  Service: Orthopedics;  Laterality: Right;  . TOTAL KNEE ARTHROPLASTY Left 07/13/2015   Procedure: LEFT TOTAL KNEE ARTHROPLASTY;   Surgeon: Meredith Pel, MD;  Location: [place];  Service: Orthopedics;  Laterality: Left;     No outpatient medications have been marked as taking for the 07/07/19 encounter (Appointment) with Josue Hector, MD.     Allergies:   Belsomra [suvorexant], Enalapril maleate, Latex, Nickel, Other, Morphine and related, Doxycycline, Hydrochlorothiazide, Hydroxychloroquine sulfate, Lyrica [pregabalin], and Tape   Social History   Tobacco Use  . Smoking status: Former Smoker    Packs/day: 0.50    Years: 10.00    Pack years: 5.00    Types: Cigarettes  . Smokeless tobacco: Never Used  . Tobacco comment: quit smoking in 2004  Vaping Use  . Vaping Use: Never used  Substance Use Topics  . Alcohol use: Not Currently    Alcohol/week: 1.0 standard drink    Types: 1 Glasses of wine per week  . Drug use: No     Family Hx: The patient's family history includes Breast cancer in her sister and another family member; Cancer in her father; Colon cancer (age of onset: 37) in her brother; Coronary artery disease in some other family members; Heart attack in her brother; Heart disease in her mother; Heart failure in an other family member; Hypertension in an other family member; Kidney disease in her mother; Stroke in her brother. There is no history of Esophageal cancer, Stomach cancer, or Rectal cancer.  ROS:   Please see the history of present illness.    General:no colds or fevers, no weight changes Skin:no rashes or ulcers HEENT:no blurred vision, no congestion CV:see HPI PUL:see HPI GI:no diarrhea constipation or melena, no indigestion GU:no hematuria, no dysuria MS:no joint pain, no claudication Neuro:no syncope, no lightheadedness Endo:+ diabetes, no thyroid disease  All other systems reviewed and are negative.   Prior CV studies:   The following studies were reviewed today:  ECHO 11/2016 Study Conclusions  - Left ventricle: The cavity size was normal. Wall thickness was    increased in a pattern of mild LVH. Systolic function was normal.   The estimated ejection fraction was in the range of 60% to 65%.   Although no diagnostic regional wall motion abnormality was   identified, this possibility cannot be completely excluded on the   basis of this study. Doppler parameters are consistent with   abnormal left ventricular relaxation (grade 1 diastolic   dysfunction). - Aortic valve: There was no stenosis. - Mitral valve: There was no significant regurgitation. - Right ventricle: The cavity size was normal. Systolic function   was normal. - Pulmonary arteries: No complete TR doppler jet so unable to   estimate PA systolic pressure. - Systemic veins: IVC not visualized.  Impressions:  - Normal LV size with mild LV  hypertrophy. EF 60-65%. Normal RV   size and systolic function. No significant valvular   abnormalities.   Labs/Other Tests and Data Reviewed:    EKG:   01/23/18 SR rate 68 normal eCG   Recent Labs: 08/27/2018: ALT 23; BUN 18; Creatinine, Ser 0.90; Potassium 4.4; Sodium 143   Recent Lipid Panel Lab Results  Component Value Date/Time   CHOL 220 (H) 08/27/2018 03:40 PM   TRIG 107 08/27/2018 03:40 PM   HDL 88 08/27/2018 03:40 PM   CHOLHDL 2.5 08/27/2018 03:40 PM   CHOLHDL 3 06/30/2017 03:41 PM   LDLCALC 111 (H) 08/27/2018 03:40 PM   LDLDIRECT 107.0 06/30/2017 03:41 PM    Wt Readings from Last 3 Encounters:  05/06/19 298 lb (135.2 kg)  02/01/19 293 lb (132.9 kg)  11/19/18 293 lb (132.9 kg)     Objective:    Vital Signs:  LMP 05/19/2003    VITAL SIGNS:  reviewed  Chronically ill female  Healthy:  appears stated age 50: normal Neck supple with no adenopathy JVP normal no bruits no thyromegaly Lungs COPD no active wheezing  Heart:  S1/S2 no murmur, no rub, gallop or click PMI normal Abdomen: benighn, BS positve, no tenderness, no AAA no bruit.  No HSM or HJR Distal pulses intact with no bruits No edema Neuro  non-focal Skin warm and dry No muscular weakness  ASSESSMENT & PLAN:    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 Sarcoid followed by pulmonary CT 07/21/18 stable parenchymal lesions and adenopathy ECG normal no heart block  3. HLD LDL 111 08/27/18 f/u primary consider statin with DM 4. DM:  Discussed low carb diet.  Target hemoglobin A1c is 6.5 or less.  Continue current medications. A1c 6.2 f/u [place]    COVID-19 Education: The signs and symptoms of COVID-19 were discussed with the patient and how to seek care for testing (follow up with PCP or arrange E-visit).  The importance of social distancing was discussed today.   Medication Adjustments/Labs and Tests Ordered: Current medicines are reviewed at length with the patient today.  Concerns regarding medicines are outlined above.   Tests Ordered: No orders of the defined types were placed in this encounter.   Medication Changes: No orders of the defined types were placed in this encounter.   Follow Up:  Cardiology PRN   Signed, Jenkins Rouge, MD  07/05/2019 1:28 PM    [place]

## 2019-07-06 ENCOUNTER — Telehealth (INDEPENDENT_AMBULATORY_CARE_PROVIDER_SITE_OTHER): Payer: Medicare Other | Admitting: Internal Medicine

## 2019-07-06 ENCOUNTER — Ambulatory Visit: Payer: Medicare Other | Admitting: Sports Medicine

## 2019-07-06 DIAGNOSIS — R197 Diarrhea, unspecified: Secondary | ICD-10-CM | POA: Insufficient documentation

## 2019-07-06 MED ORDER — DIPHENOXYLATE-ATROPINE 2.5-0.025 MG PO TABS: 1.0000 | ORAL_TABLET | Freq: Four times a day (QID) | ORAL | 0 refills | Status: DC | PRN

## 2019-07-06 NOTE — Unmapped (Signed)
[facility] Refill Coordination Note    Specialty Medication(s) to be Shipped:   Inflammatory Disorders: Humira    Other medication(s) to be shipped:       Rudi Coco, DOB: [birth year]  Phone: [phone] (home)       All above HIPAA information was verified with patient.     Was a Nurse, learning disability used for this call? No    Completed refill call assessment today to schedule patient's medication shipment from the [facility] [phone]).       Specialty medication(s) and dose(s) confirmed: Regimen is correct and unchanged.   Changes to medications: Kelin reports no changes at this time.  Changes to insurance: No  Questions for the pharmacist: No    Confirmed patient received Welcome Packet with first shipment. The patient will receive a drug information handout for each medication shipped and additional FDA Medication Guides as required.       DISEASE/MEDICATION-SPECIFIC INFORMATION        For patients on injectable medications: Patient currently has 2 doses left.  Next injection is scheduled for 07/07/2019.    SPECIALTY MEDICATION ADHERENCE     Medication Adherence    Patient reported X missed doses in the last month: 0  Specialty Medication: Humira  Patient is on additional specialty medications: No  Any gaps in refill history greater than 2 weeks in the last 3 months: no  Demonstrates understanding of importance of adherence: yes  Informant: patient  Reliability of informant: reliable  Confirmed plan for next specialty medication refill: delivery by pharmacy  Refills needed for supportive medications: not needed                Humira 40/0.4 mg/ml: 14 days of medicine on hand         SHIPPING     Shipping address confirmed in Epic.     Delivery Scheduled: Yes, Expected medication delivery date: 07/13/2019.     Medication will be delivered via UPS to the prescription address in Epic WAM.    Ilaria Much D Stephene Alegria   [facility] Specialty Technician

## 2019-07-06 NOTE — Progress Notes (Signed)
Virtual Visit via Video Note  I connected with Lorita Forinash Fojtik on 07/06/19 at  3:00 PM EDT by a video enabled telemedicine application and verified that I am speaking with the correct person using two identifiers.   I discussed the limitations of evaluation and management by telemedicine and the availability of in person appointments. The patient expressed understanding and agreed to proceed.  I was located at our [facility] office. The patient was at home. There was no one else present in the visit.   History of Present Illness: C/o L sided abd pain, cramps, watery diarrhea x 2.5 wks. Not better. She is s/p cholecystectomy. C/o cramps, no nausea/vomiting.  Drinking alkaline water, no tap water  There has been no runny nose, cough, chest pain, shortness of breath, abdominal pain, diarrhea, constipation, arthralgias, skin rashes.   Observations/Objective: The patient appears to be in no acute distress.  Assessment and Plan:  See my Assessment and Plan. Follow Up Instructions:    I discussed the assessment and treatment plan with the patient. The patient was provided an opportunity to ask questions and all were answered. The patient agreed with the plan and demonstrated an understanding of the instructions.   The patient was advised to call back or seek an in-person evaluation if the symptoms worsen or if the condition fails to improve as anticipated.  I provided face-to-face time during this encounter. We were at different locations.   Walker Kehr, MD

## 2019-07-06 NOTE — Assessment & Plan Note (Signed)
Acute ?etiology C/o L sided abd pain, cramps, watery diarrhea x 2.5 wks. Not better. She is s/p cholecystectomy. C/o cramps, no nausea/vomiting. Drinking alkaline water, no tap water Pt refused labs/tests - I'm scared to leave the house GI ref - she agreed To ER if worse Lomotil prn

## 2019-07-07 ENCOUNTER — Ambulatory Visit: Payer: Medicare Other | Admitting: Cardiovascular Disease

## 2019-07-10 ENCOUNTER — Other Ambulatory Visit: Payer: Self-pay | Admitting: Pulmonary Disease

## 2019-07-12 MED FILL — HUMIRA PEN CITRATE FREE 40 MG/0.4 ML: SUBCUTANEOUS | 28 days supply | Qty: 4 | Fill #1

## 2019-07-12 MED FILL — HUMIRA PEN CITRATE FREE 40 MG/0.4 ML: 28 days supply | Qty: 4 | Fill #1 | Status: AC

## 2019-07-22 ENCOUNTER — Ambulatory Visit: Payer: Medicare Other

## 2019-07-22 ENCOUNTER — Other Ambulatory Visit: Payer: Self-pay

## 2019-07-22 ENCOUNTER — Ambulatory Visit
Admission: RE | Admit: 2019-07-22 | Discharge: 2019-07-22 | Disposition: A | Discharge status: Home / Self Care | Payer: Medicare Other | Source: Ambulatory Visit | Attending: Internal Medicine | Admitting: Internal Medicine

## 2019-07-22 DIAGNOSIS — Z1231 Encounter for screening mammogram for malignant neoplasm of breast: Secondary | ICD-10-CM

## 2019-07-26 ENCOUNTER — Encounter: Payer: Self-pay | Admitting: Internal Medicine

## 2019-08-02 ENCOUNTER — Other Ambulatory Visit: Payer: Self-pay | Admitting: *Deleted

## 2019-08-02 NOTE — Patient Outreach (Addendum)
[facility] Care Management  08/02/2019  Jovee Jane Tourangeau 11/12/1960 [id number]   RN Health Coach received  telephone call from patient.  Hipaa compliance verified. Per patient she is on continuous oxygen and her neighbor has and electric stove and she has a gas stove. Patient is wanting an electric stove.  She is having difficulty pulling her oxygen off to cook. Patient stated she had talked to the old manager that was over the apartment and was told the other person was in a handicap apartment. Patient stated she is in handicap apartment with light switches lower. Handicap rails in bathroom at toilet and shower. RN told patient to call the new management and explain that she has a gas stove. It is a risk to her and the other tenants for her to cook on a gas stove with oxygen in the apartment.  Patient called new manager and told them the above information. Per new manager they will have a new electric range delivered by the end of the week.   Plan: RN will follow up outreach next week to see if needs have been met     BSN RN [facility] Care Management [phone]   

## 2019-08-03 ENCOUNTER — Ambulatory Visit: Payer: Self-pay | Admitting: *Deleted

## 2019-08-04 NOTE — Unmapped (Signed)
[facility] Refill Coordination Note    Specialty Medication(s) to be Shipped:   Inflammatory Disorders: Humira    Other medication(s) to be shipped:       Carolyn Newton, DOB: 12/07/1960  Phone: [phone] (home)       All above HIPAA information was verified with patient.     Was a Nurse, learning disability used for this call? No    Completed refill call assessment today to schedule patient's medication shipment from the [facility] [phone]).       Specialty medication(s) and dose(s) confirmed: Regimen is correct and unchanged.   Changes to medications: [name] reports no changes at this time.  Changes to insurance: No  Questions for the pharmacist: No    Confirmed patient received Welcome Packet with first shipment. The patient will receive a drug information handout for each medication shipped and additional FDA Medication Guides as required.       DISEASE/MEDICATION-SPECIFIC INFORMATION        For patients on injectable medications: Patient currently has 1 doses left.  Next injection is scheduled for 08/11/2019.    SPECIALTY MEDICATION ADHERENCE     Medication Adherence    Patient reported X missed doses in the last month: 0  Specialty Medication: Humira CF 40 mg/0.4 ml  Patient is on additional specialty medications: No  Any gaps in refill history greater than 2 weeks in the last 3 months: no  Demonstrates understanding of importance of adherence: yes  Informant: patient  Reliability of informant: reliable  Confirmed plan for next specialty medication refill: delivery by pharmacy  Refills needed for supportive medications: not needed                Humira 40/0.4 mg/ml: 7 days of medicine on hand         SHIPPING     Shipping address confirmed in Epic.     Delivery Scheduled: Yes, Expected medication delivery date: 08/11/2019.     Medication will be delivered via UPS to the prescription address in Epic WAM.    Kae Lauman D Rueben Kassim   [facility] Specialty Technician

## 2019-08-07 ENCOUNTER — Other Ambulatory Visit: Payer: Self-pay | Admitting: Adult Health

## 2019-08-07 ENCOUNTER — Other Ambulatory Visit: Payer: Self-pay | Admitting: Internal Medicine

## 2019-08-07 MED ORDER — NALTREXONE 50 MG TABLET
ORAL_TABLET | 11 refills | 0 days
Start: 2019-08-07 — End: ?

## 2019-08-09 ENCOUNTER — Other Ambulatory Visit: Payer: Self-pay | Admitting: Internal Medicine

## 2019-08-09 MED ORDER — NALTREXONE 50 MG TABLET
ORAL_TABLET | 11 refills | 0.00000 days
Start: 2019-08-09 — End: ?

## 2019-08-09 NOTE — Unmapped (Signed)
Received refill request for medication pended below. Patient last seen 05/2019.

## 2019-08-10 MED ORDER — NALTREXONE 50 MG TABLET
ORAL_TABLET | 11 refills | 0 days | Status: CP
Start: 2019-08-10 — End: ?

## 2019-08-10 MED FILL — HUMIRA PEN CITRATE FREE 40 MG/0.4 ML: SUBCUTANEOUS | 28 days supply | Qty: 4 | Fill #2

## 2019-08-10 MED FILL — HUMIRA PEN CITRATE FREE 40 MG/0.4 ML: 28 days supply | Qty: 4 | Fill #2 | Status: AC

## 2019-08-10 NOTE — Unmapped (Signed)
Rx request received for:  Requested Prescriptions     Pending Prescriptions Disp Refills   ??? naltrexone (DEPADE) 50 mg tablet [Pharmacy Med Name: naltrexone 50 mg tablet] 30 tablet 11     Sig: Split tablets into quarters. Take one-quarter of a tablet once daily

## 2019-08-11 ENCOUNTER — Other Ambulatory Visit: Payer: Self-pay | Admitting: Internal Medicine

## 2019-08-12 ENCOUNTER — Other Ambulatory Visit: Payer: Self-pay | Admitting: *Deleted

## 2019-08-12 NOTE — Patient Outreach (Signed)
[facility]) Care Management  [place]  08/12/2019   [name] [facility] July 05, 1960 [id number]  RN Health Coach telephone call to patient.  [employer] compliance verified. Per patient she has received the electric stove which is more safe with her on oxygen. Patient is currently on O2 2L nasal cannula. Patient is using inhalers as prescribed. Patient has not had any recent falls. Patient is not aware of pursed lip breathing. Patient has agreed to follow up outreach calls.   Encounter Medications:  Outpatient Encounter Medications as of 08/12/2019  Medication Sig  . albuterol (VENTOLIN HFA) 108 (90 Base) MCG/ACT inhaler Inhale 2 puffs into the lungs every 6 (six) hours as needed for wheezing or shortness of breath.  . B-D ULTRA-FINE 33 LANCETS MISC Use as instructed to test sugars once  daily  . budesonide-formoterol (SYMBICORT) 160-4.5 MCG/ACT inhaler Inhale 2 puffs into the lungs TWICE DAILY  . carvedilol (COREG) 12.5 MG tablet Take 1 tablet (12.5 mg total) by mouth 2 (two) times daily with a meal. Annual appt due in June must see provider for future refills  . clonazePAM (KLONOPIN) 0.5 MG tablet Take 1 TO 2 tablets (0.5-1 mg total) by mouth at bedtime as needed for anxiety.  . DEXILANT 60 MG capsule Take 1 capsule (60 mg total) by mouth daily.  . diclofenac Sodium (VOLTAREN) 1 % GEL Apply 2 g topically 2 (two) times daily as needed (for pain). 100/4=25  . diphenoxylate-atropine (LOMOTIL) 2.5-0.025 MG tablet Take 1-2 tablets by mouth 4 (four) times daily as needed for diarrhea or loose stools.  . furosemide (LASIX) 40 MG tablet TAKE 1 TABLET BY MOUTH EVERY MORNING FOR EDEMA (Patient taking differently: Take 40 mg by mouth daily as needed for fluid or edema. )  . gabapentin (NEURONTIN) 300 MG capsule Take 1 capsule (300 mg total) by mouth 2 (two) times daily as needed (for pain).  [name] glucose blood (ACCU-CHEK GUIDE) test strip Use to check blood sugar once a day.  [name]  HUMIRA PEN 40 MG/0.4ML PNKT SMARTSIG:40 Milligram(s) SUB-Q Once a Week  . hydrOXYzine (ATARAX/VISTARIL) 25 MG tablet Take 1 tablet (25 mg total) by mouth every 8 (eight) hours as needed for itching. Follow=-up appt is due must see provider for future refills  . metFORMIN (GLUCOPHAGE-XR) 500 MG 24 hr tablet Take 2 tablets (1,000 mg total) by mouth at bedtime.  . naltrexone (DEPADE) 50 MG tablet Split tablets into quarters. Take one-quarter of a tablet once daily  . OXYGEN Inhale into the lungs. Takes 2 liters every day (24/7)  . potassium chloride (KLOR-CON) 10 MEQ tablet Take 1 tablet (10 mEq total) by mouth daily. Annual appt due in June must see provider for future refills  . SUPREP BOWEL PREP KIT 17.5-3.13-1.6 GM/177ML SOLN SMARTSIG:1 Kit(s) By Mouth Once  . topiramate (TOPAMAX) 50 MG tablet Take 1 tablet (50 mg total) by mouth 2 (two) times daily.  [name] UNABLE TO FIND Use as instructed to check blood sugar once a day.  . venlafaxine XR (EFFEXOR-XR) 75 MG 24 hr capsule Take 1 capsule (75 mg total) by mouth daily with breakfast.  . XARELTO 20 MG TABS tablet Take 1 tablet (20 mg total) by mouth at bedtime.  . [DISCONTINUED] clonazePAM (KLONOPIN) 0.5 MG tablet Take 1 TO 2 tablets (0.5-1 mg total) by mouth at bedtime as needed for anxiety. (Patient taking differently: Take 0.5 mg by mouth at bedtime. )   No facility-administered encounter medications on file as of 08/12/2019.  Functional Status:  In your present state of health, do you have any difficulty performing the following activities: 02/02/2019  Hearing? N  Vision? N  Difficulty concentrating or making decisions? N  Walking or climbing stairs? Y  Comment Patient uses a walker and is on continously O2  Dressing or bathing? Y  Comment patient has a caregiver  Doing errands, shopping? Y  Comment caregiver and family handles all shopping  Preparing Food and eating ? Y  Using the Toilet? N  In the past six months, have you accidently  leaked urine? N  Do you have problems with loss of bowel control? N  Managing your Medications? N  Managing your Finances? N  Housekeeping or managing your Housekeeping? Y  Comment Patient has a caregiver  Some recent data might be hidden    Fall/Depression Screening: Fall Risk  08/12/2019 05/04/2019 02/02/2019  Falls in the past year? _0 Comment - - 5 falls  Number falls in past yr: _1 Injury with Fall? 0 0 0  Risk Factor Category  - - -  Risk for fall due to : History of fall(s);Impaired balance/gait;Impaired mobility History of fall(s);Impaired balance/gait;Impaired mobility History of fall(s);Impaired balance/gait;Impaired mobility  Follow up Falls evaluation completed Falls evaluation completed;Falls prevention discussed Falls evaluation completed;Falls prevention discussed;Education provided   [facility] 2/9 Scores 02/02/2019 04/29/2018 04/29/2018 11/07/2017 10/06/2017 10/02/2017 02/17/2017  PHQ - 2 Score _2 0 0 2  PHQ- 9 Score _3 - - 7   Goals Addressed            This Visit's Progress   . Client will verbalize knowledge of chronic lung disease as evidenced by no ED visits or Inpatient stays related to chronic lung disease        CARE PLAN ENTRY (see longtitudinal plan of care for additional care plan information)  Current Barriers:  [name] Knowledge deficits related to basic understanding of COPD disease process . Knowledge deficits related to basic COPD self care/management . Knowledge deficit related to importance of energy conservation . Transportation barriers   Case Manager Clinical Goal(s):  Over the next 90 days patient will report utilizing pursed lip breathing for shortness of breath  Over the next 90 days, patient will be able to verbalize understanding of COPD action plan and when to seek appropriate levels of medical care  Over the next 90 days, patient will engage in lite exercise as tolerated to build/regain stamina and strength and reduce shortness  of breath through activity tolerance  Over the next 90 days, patient will verbalize basic understanding of COPD disease process and self care activities  Over the next 90 days, patient will not be hospitalized for COPD exacerbation   Interventions:   Provided patient with basic written and verbal COPD education on self care/management/and exacerbation prevention   Provided patient with COPD action plan and reinforced importance of daily self assessment  Provided written and verbal instructions on pursed lip breathing and utilized returned demonstration as teach back  Advised patient to self assesses COPD action plan zone and make appointment with provider if in the yellow zone for 48 hours without improvement.  Provided patient with education about the role of exercise in the management of COPD  Advised patient to engage in light exercise as tolerated 3-5 days a week  Patient Self Care Activities:  . Takes medications as prescribed including inhalers . Practices and uses pursed lip breathing for shortness of  breath recovery and prevention . Self assesses COPD action plan zone and makes appointment with provider if in the yellow zone for 48 hours without improvement. . Engages in light exercise 3-5 days a week . Utilizes infection prevention strategies to reduce risk of respiratory infection  . Behavior Modification  RN will follow up outreach within the month of October          Assessment:  Patient is on O2 2l per N/C Patient has not heard of pursed lip breathing Patient has received electric stove Patient has not had any recent falls Plan:  Provide educational material on pursed lip breathing Provide Exercise program activity booklet Patient will continue to use inhalers as per order RN sent update assessment to PCP  [place] Management [phone]

## 2019-08-18 ENCOUNTER — Other Ambulatory Visit: Payer: Self-pay | Admitting: Internal Medicine

## 2019-08-19 ENCOUNTER — Ambulatory Visit: Payer: Medicare Other | Admitting: Sports Medicine

## 2019-08-19 ENCOUNTER — Encounter: Payer: Self-pay | Admitting: Nurse Practitioner

## 2019-08-19 ENCOUNTER — Ambulatory Visit (INDEPENDENT_AMBULATORY_CARE_PROVIDER_SITE_OTHER): Payer: Medicare Other | Admitting: Nurse Practitioner

## 2019-08-19 VITALS — BP 130/70 | HR 67 | Ht 67.0 in | Wt 274.0 lb

## 2019-08-19 DIAGNOSIS — R197 Diarrhea, unspecified: Secondary | ICD-10-CM

## 2019-08-19 DIAGNOSIS — Z800 Family history of malignant neoplasm of digestive organs: Secondary | ICD-10-CM | POA: Diagnosis not present

## 2019-08-19 NOTE — Patient Instructions (Signed)
Go to the basement for lab kits today  You can take Lomotil after submission of stool specimen  If you are age 59 or older, your body mass index should be between 23-30. Your Body mass index is 42.91 kg/m. If this is out of the aforementioned range listed, please consider follow up with your Primary Care Provider.  If you are age 13 or younger, your body mass index should be between 19-25. Your Body mass index is 42.91 kg/m. If this is out of the aformentioned range listed, please consider follow up with your Primary Care Provider.    Due to recent changes in healthcare laws, you may see the results of your imaging and laboratory studies on MyChart before your provider has had a chance to review them.  We understand that in some cases there may be results that are confusing or concerning to you. Not all laboratory results come back in the same time frame and the provider may be waiting for multiple results in order to interpret others.  Please give [place] 48 hours in order for your provider to thoroughly review all the results before contacting the office for clarification of your results.   I appreciate the  opportunity to care for you  Thank You   [place]

## 2019-08-19 NOTE — Progress Notes (Signed)
IMPRESSION and PLAN:     [name] is a 59 y.o. female with a PMH signficant for, but not necessarily limited to, COPD on home 02, RA Humira,  DVT on Xarelto,  diabetes, HTN, fibromyalgia, [place] of colon cancer  # Diarrhea with intermittent LUQ pain --6 weeks duration --Nontoxic-appearing, abdominal exam unremarkable --First need to rule out infection.  Will obtain GI pathogen panel and lactoferrin.  Patient says that she could submit specimen today if eats peppermint and drinks water.  However, this may cause subsequent bowel movements and she is relying on public transportation to get home.  She may need to take public transportation to return stool specimen so results will be delayed --Patient has Lomotil on hand.  Once stool submitted she can use the Lomotil as needed.  --Patient had to cancel screening colonoscopy in January ( + [facility]) due to lack of transportation.. If stool studies negative then we most certainly need to proceed with colonoscopy at the hospitalfor further evaluation of diarrhea. Patient says that she will \start working on finding transportation.  Procedure cannot be scheduled until C. difficile results available       HPI:    Primary GI: Angela Jarred, MD   Chief complaint : diarrhea  This patient is a 59 yo female with multiple medical problems on multiple medications.  He was seen November 2024 family history of colon cancer in brother at age 40.  Patient was scheduled for a colonoscopy to be done at the hospital in January but was unable to find a ride so the procedure was canceled  Patient comes in for evaluation of diarrhea.and LUQ pain  She says the diarrhea started about 6 weeks ago.  Prior to that she was having a solid bowel movement 1-2 times a day. Had a visit with PCP 6/22, she was unable to go for labs. Given Lomotil but hasn't started it. Referred to GI   INTERVAL HISTORY:    Patient has been having 5-6 loose bowel movements a day,  mainly during waking hours. None of her Bms are solid.  Stools are mostly postprandial and occur within 10 minutes of eating.  She does not look at stool prior to flushing so cannot comment on what the diarrhea looks like nor if there has been any blood in her stool . The onset of diarrhea was associated with LUQ pain. The pain and is intermittent, thinks eating makes it worse.  No associated nausea nor vomiting. No fevers. Patient hasn't had any recent medication additions or changes. No recent antibiotics.    Review of systems:     No chest pain, no SOB, no fevers, no urinary sx   Past Medical History:  Diagnosis Date  . Allergic rhinitis   . ALLERGIC RHINITIS 10/26/2009  . Anxiety   . ANXIETY 08/14/2006  . B12 DEFICIENCY 08/25/2007  . Complication of anesthesia    pt has had difficulty following anesthesia with her knee in 2016-unable to care for herself afterward  . Confusion   . Depression    takes Lexapro daily  . Diabetes mellitus without complication ([place])    was on insulin but has been off since Nov 2015 and now only takes Metformin daily  . DYSPNEA 04/28/2009   with exertion  . Esophageal reflux    takes Nexium daily  . Fibromyalgia   . Headache    last migraine 2-27yr ago;takes Topamax daily  . History of shingles   . Hypertension  takes Coreg daily  . Insomnia    takes Nortriptyline nightly   . Joint pain   . Joint swelling   . Left knee pain   . Lichen planus   . Long-term memory loss   . Nocturia   . OSA (obstructive sleep apnea)    doesn't use CPAP;sleep study in epic from 2006  . Osteoarthritis   . Osteoarthritis   . Pneumonia    over 30 yrs ago  . Protein calorie malnutrition ([place])   . Rheumatoid arthritis ([place])   . Sarcoidosis    Dr. Lolita Patella  . Short-term memory loss   . Shortness of breath   . Sleep apnea     wears oxygen  . TIA (transient ischemic attack)   . Unsteady gait   . Urinary urgency   . Vitamin D deficiency    is supposed to take  Vit D but can't afford it  . VITAMIN D DEFICIENCY 08/25/2007    Patient's surgical history, family medical history, social history, medications and allergies were all reviewed in Epic   Creatinine clearance cannot be calculated (Patient's most recent lab result is older than the maximum 21 days allowed.)  Current Outpatient Medications  Medication Sig Dispense Refill  . albuterol (VENTOLIN HFA) 108 (90 Base) MCG/ACT inhaler Inhale 2 puffs into the lungs every 6 (six) hours as needed for wheezing or shortness of breath. 8.5 g 0  . B-D ULTRA-FINE 33 LANCETS MISC Use as instructed to test sugars once  daily 100 each 12  . budesonide-formoterol (SYMBICORT) 160-4.5 MCG/ACT inhaler Inhale 2 puffs into the lungs TWICE DAILY 10.2 g 5  . carvedilol (COREG) 12.5 MG tablet Take 1 tablet (12.5 mg total) by mouth 2 (two) times daily with a meal. Annual appt due in June must see provider for future refills 60 tablet 0  . clonazePAM (KLONOPIN) 0.5 MG tablet Take 1 TO 2 tablets (0.5-1 mg total) by mouth at bedtime as needed for anxiety. 60 tablet 3  . DEXILANT 60 MG capsule Take 1 capsule (60 mg total) by mouth daily. 90 capsule 2  . diclofenac Sodium (VOLTAREN) 1 % GEL Apply 2 g topically 2 (two) times daily as needed (for pain). 100/4=25    . furosemide (LASIX) 40 MG tablet TAKE 1 TABLET BY MOUTH EVERY MORNING FOR EDEMA (Patient taking differently: Take 40 mg by mouth daily as needed for fluid or edema. ) 90 tablet 1  . gabapentin (NEURONTIN) 300 MG capsule Take 1 capsule (300 mg total) by mouth 2 (two) times daily as needed (for pain). 180 capsule 0  . glucose blood (ACCU-CHEK GUIDE) test strip Use to check blood sugar once a day. 100 each 12  . HUMIRA PEN 40 MG/0.4ML PNKT SMARTSIG:40 Milligram(s) SUB-Q Once a Week    . hydrOXYzine (ATARAX/VISTARIL) 25 MG tablet Take 1 tablet (25 mg total) by mouth every 8 (eight) hours as needed for itching. Follow=-up appt is due must see provider for future refills 60  tablet 1  . metFORMIN (GLUCOPHAGE-XR) 500 MG 24 hr tablet Take 2 tablets (1,000 mg total) by mouth at bedtime. 180 tablet 2  . naltrexone (DEPADE) 50 MG tablet Split tablets into quarters. Take one-quarter of a tablet once daily    . OXYGEN Inhale into the lungs. Takes 2 liters every day (24/7)    . potassium chloride (KLOR-CON) 10 MEQ tablet Take 1 tablet (10 mEq total) by mouth daily. Annual appt due in June must see provider for future refills  30 tablet 0  . SUPREP BOWEL PREP KIT 17.5-3.13-1.6 GM/177ML SOLN SMARTSIG:1 Kit(s) By Mouth Once    . topiramate (TOPAMAX) 50 MG tablet Take 1 tablet (50 mg total) by mouth 2 (two) times daily. 180 tablet 3  . UNABLE TO FIND Use as instructed to check blood sugar once a day.    . venlafaxine XR (EFFEXOR-XR) 75 MG 24 hr capsule Take 1 capsule (75 mg total) by mouth daily with breakfast. 30 capsule 11  . XARELTO 20 MG TABS tablet Take 1 tablet (20 mg total) by mouth at bedtime. 90 tablet 3  . diphenoxylate-atropine (LOMOTIL) 2.5-0.025 MG tablet Take 1-2 tablets by mouth 4 (four) times daily as needed for diarrhea or loose stools. (Patient not taking: Reported on 08/19/2019) 60 tablet 0   No current facility-administered medications for this visit.    Filed Weights   08/19/19 1421  Weight: 274 lb (124.3 kg)    Physical Exam:     BP 130/70   Pulse 67   Ht 5' 7" (1.702 m)   Wt 274 lb (124.3 kg)   LMP 05/19/2003   BMI 42.91 kg/m   GENERAL:  Pleasant female in NAD PSYCH: : Cooperative, normal affect CARDIAC:  RRR PULM: Normal respiratory effort, lungs CTA bilaterally, no wheezing ABDOMEN:  Nondistended, soft, nontender. No obvious masses, no hepatomegaly,  normal bowel sounds SKIN:  turgor, no lesions seen Musculoskeletal:  Normal muscle tone, normal strength NEURO: Alert and oriented x 3, no focal neurologic deficits   Tye Savoy , NP 08/19/2019, 2:28 PM

## 2019-08-23 ENCOUNTER — Other Ambulatory Visit: Payer: Medicare Other

## 2019-08-23 DIAGNOSIS — R1012 Left upper quadrant pain: Secondary | ICD-10-CM | POA: Diagnosis not present

## 2019-08-23 DIAGNOSIS — R197 Diarrhea, unspecified: Secondary | ICD-10-CM

## 2019-08-23 NOTE — Progress Notes (Signed)
   CARDIOLOGY OFFICE NOTE  Date:  08/30/2019    [name] Date of Birth: 10/17/1960 Medical Record #[id number]  PCP:  Plotnikov, Aleksei V, MD  Cardiologist:  Nishan   Chief Complaint  Patient presents with  . Follow-up    History of Present Illness: [name] is a 59 y.o. female who presents today for a one year check. Seen for Dr. Nishan.   She has no history of known CAD. 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 Normal Myoview in 2014 for atypical chest pain. Echo from 2018 with normal EF noted.   Last seen in August 2020 by Laura Ingold NP and was felt to be doing well - had just had gallbladder surgery.   Comes in today. Here alone. She notes she has had more GI issues with diarrhea - has seen GI and will be needing colonoscopy at some point. She has to have transportation arranged - typically done at the hospital due to being on oxygen. Heart wise has been doing ok. No chest pain. Remains on oxygen. Remains on Xarelto. The hot weather makes her breathing harder but she feels this is stable. No palpitations. She is not lightheaded or dizzy. She has been vaccinated for COVID 19.  She is basically staying in so that she can "stay safe". Her labs are checked by pulmonary and PCP. She feels like her cardiac status is ok. Remains on continuous oxygen.   Past Medical History:  Diagnosis Date  . Allergic rhinitis   . ALLERGIC RHINITIS 10/26/2009  . Anxiety   . ANXIETY 08/14/2006  . B12 DEFICIENCY 08/25/2007  . Complication of anesthesia    pt has had difficulty following anesthesia with her knee in 2016-unable to care for herself afterward  . Confusion   . Depression    takes Lexapro daily  . Diabetes mellitus without complication (HCC)    was on insulin but has been off since Nov 2015 and now only takes Metformin daily  . DYSPNEA 04/28/2009   with exertion  .  Esophageal reflux    takes Nexium daily  . Fibromyalgia   . Headache    last migraine 2-3yrs ago;takes Topamax daily  . History of shingles   . Hypertension    takes Coreg daily  . Insomnia    takes Nortriptyline nightly   . Joint pain   . Joint swelling   . Left knee pain   . Lichen planus   . Long-term memory loss   . Nocturia   . OSA (obstructive sleep apnea)    doesn't use CPAP;sleep study in epic from 2006  . Osteoarthritis   . Osteoarthritis   . Pneumonia    over 30 yrs ago  . Protein calorie malnutrition (HCC)   . Rheumatoid arthritis (HCC)   . Sarcoidosis    Dr. Zeminski  . Short-term memory loss   . Shortness of breath   . Sleep apnea     wears oxygen  . TIA (transient ischemic attack)   . Unsteady gait   . Urinary urgency   . Vitamin D deficiency    is supposed to take Vit D but can't afford it  . VITAMIN D DEFICIENCY 08/25/2007    Past Surgical History:  Procedure Laterality Date  . APPENDECTOMY    . arthroscopic knee surgery Right 11-12-04  . AXILLARY ABCESS IRRIGATION AND DEBRIDEMENT  Jul & Aug2012  . CARPAL TUNNEL   RELEASE Left 05/23/2014   Procedure: CARPAL TUNNEL RELEASE;  Surgeon: Scott Gregory Dean, MD;  Location: [facility] OR;  Service: Orthopedics;  Laterality: Left;  . CHOLECYSTECTOMY N/A 02/11/2018   Procedure: LAPAROSCOPIC CHOLECYSTECTOMY WITH INTRAOPERATIVE CHOLANGIOGRAM ERAS PATHWAY;  Surgeon: Tsuei, Matthew, MD;  Location: [facility] OR;  Service: General;  Laterality: N/A;  . cyst removed from top of buttocks  at age 16  . ENDOMETRIAL ABLATION    . IR CHOLANGIOGRAM EXISTING TUBE  11/21/2017  . IR CHOLANGIOGRAM EXISTING TUBE  01/16/2018  . IR EXCHANGE BILIARY DRAIN  11/10/2017  . IR PATIENT EVAL TECH 0-60 MINS  02/03/2018  . IR PERC CHOLECYSTOSTOMY  09/13/2017  . IR RADIOLOGIST EVAL & MGMT  10/08/2017  . LACRIMAL DUCT EXPLORATION Right 06/26/2017   Procedure: LACRIMAL DUCT EXPLORATION AND ETHMOIDECTOMY;  Surgeon: Abugo, Usiwoma Ene, MD;  Location: [facility] OR;   Service: Ophthalmology;  Laterality: Right;  . TEAR DUCT PROBING Right 06/26/2017   Procedure: TEAR DUCT PROBING WITH STENT;  Surgeon: Abugo, Usiwoma Ene, MD;  Location: [facility];  Service: Ophthalmology;  Laterality: Right;  . TOTAL KNEE ARTHROPLASTY Right 11/15/2014   Procedure: TOTAL RIGHT KNEE ARTHROPLASTY;  Surgeon: Scott Gregory Dean, MD;  Location: [facility];  Service: Orthopedics;  Laterality: Right;  . TOTAL KNEE ARTHROPLASTY Left 07/13/2015   Procedure: LEFT TOTAL KNEE ARTHROPLASTY;  Surgeon: Scott Gregory Dean, MD;  Location: [facility];  Service: Orthopedics;  Laterality: Left;     Medications: Current Meds  Medication Sig  . albuterol (VENTOLIN HFA) 108 (90 Base) MCG/ACT inhaler Inhale 2 puffs into the lungs every 6 (six) hours as needed for wheezing or shortness of breath.  . B-D ULTRA-FINE 33 LANCETS MISC Use as instructed to test sugars once  daily  . budesonide-formoterol (SYMBICORT) 160-4.5 MCG/ACT inhaler Inhale 2 puffs into the lungs TWICE DAILY  . carvedilol (COREG) 12.5 MG tablet Take 1 tablet (12.5 mg total) by mouth 2 (two) times daily with a meal. Annual appt due in June must see provider for future refills  . clonazePAM (KLONOPIN) 0.5 MG tablet Take 1 TO 2 tablets (0.5-1 mg total) by mouth at bedtime as needed for anxiety.  . DEXILANT 60 MG capsule Take 1 capsule (60 mg total) by mouth daily.  . diclofenac Sodium (VOLTAREN) 1 % GEL Apply 2 g topically 2 (two) times daily as needed (for pain). 100/4=25  . diphenoxylate-atropine (LOMOTIL) 2.5-0.025 MG tablet Take 1-2 tablets by mouth 4 (four) times daily as needed for diarrhea or loose stools.  . furosemide (LASIX) 40 MG tablet TAKE 1 TABLET BY MOUTH EVERY MORNING FOR EDEMA (Patient taking differently: Take 40 mg by mouth daily as needed for fluid or edema. )  . gabapentin (NEURONTIN) 300 MG capsule Take 1 capsule (300 mg total) by mouth 2 (two) times daily as needed (for pain).  . glucose blood (ACCU-CHEK GUIDE) test strip Use to  check blood sugar once a day.  . HUMIRA PEN 40 MG/0.4ML PNKT SMARTSIG:40 Milligram(s) SUB-Q Once a Week  . hydrOXYzine (ATARAX/VISTARIL) 25 MG tablet Take 1 tablet (25 mg total) by mouth every 8 (eight) hours as needed for itching. Follow=-up appt is due must see provider for future refills  . metFORMIN (GLUCOPHAGE-XR) 500 MG 24 hr tablet Take 2 tablets (1,000 mg total) by mouth at bedtime.  . naltrexone (DEPADE) 50 MG tablet Split tablets into quarters. Take one-quarter of a tablet once daily  . OXYGEN Inhale into the lungs. Takes 2 liters every day (24/7)  .   potassium chloride (KLOR-CON) 10 MEQ tablet Take 1 tablet (10 mEq total) by mouth daily. Annual appt due in June must see provider for future refills  . SUPREP BOWEL PREP KIT 17.5-3.13-1.6 GM/177ML SOLN SMARTSIG:1 Kit(s) By Mouth Once  . topiramate (TOPAMAX) 50 MG tablet Take 1 tablet (50 mg total) by mouth 2 (two) times daily.  . UNABLE TO FIND Use as instructed to check blood sugar once a day.  . venlafaxine XR (EFFEXOR-XR) 75 MG 24 hr capsule Take 1 capsule (75 mg total) by mouth daily with breakfast.  . XARELTO 20 MG TABS tablet Take 1 tablet (20 mg total) by mouth at bedtime.     Allergies: Allergies  Allergen Reactions  . Belsomra [Suvorexant] Other (See Comments)    Fell out of bed while asleep: "I'm waking up as I'm falling on the floor;" "Night terrors"  . Enalapril Maleate Cough  . Latex Hives  . Nickel Other (See Comments)    Blisters  Pt has a titanium right and left knee - nickel causes skin irritations that form into blisters and sores  . Other Other (See Comments)    Patient has Sarcoidosis and can't tolerate any metals  . Morphine And Related Itching and Nausea Only  . Doxycycline Diarrhea and Nausea And Vomiting  . Hydrochlorothiazide Other (See Comments)    Low potassium levels   . Hydroxychloroquine Sulfate Other (See Comments)    Vision changes   . Lyrica [Pregabalin] Other (See Comments)    "Made me feel  high"  . Tape Other (See Comments)    Medical tape causes bruising    Social History: The patient  reports that she has quit smoking. Her smoking use included cigarettes. She has a 5.00 pack-year smoking history. She has never used smokeless tobacco. She reports previous alcohol use of about 1.0 standard drink of alcohol per week. She reports that she does not use drugs.   Family History: The patient's family history includes Breast cancer in her sister and another family member; Cancer in her father; Colon cancer (age of onset: 56) in her brother; Coronary artery disease in some other family members; Heart attack in her brother; Heart disease in her mother; Heart failure in an other family member; Hypertension in an other family member; Kidney disease in her mother; Stroke in her brother.   Review of Systems: Please see the history of present illness.   All other systems are reviewed and negative.   Physical Exam: VS:  BP 122/78   Pulse 96   Ht 5' 7" (1.702 m)   Wt 275 lb (124.7 kg)   LMP 05/19/2003   BMI 43.07 kg/m  .  BMI Body mass index is 43.07 kg/m.  Wt Readings from Last 3 Encounters:  08/30/19 275 lb (124.7 kg)  08/19/19 274 lb (124.3 kg)  05/06/19 298 lb (135.2 kg)    General: Alert and in no acute distress. She is morbidly obese. Looks chronically ill. She is using a walker and has oxygen in place.   Cardiac: Regular rate and rhythm. No murmurs, rubs, or gallops. No edema.  Respiratory:  Lungs are clear to auscultation bilaterally with normal work of breathing.  GI: Soft and nontender.  MS: No deformity or atrophy. Gait and ROM intact. Using a walker.  Skin: Warm and dry. Color is normal.  Neuro:  Strength and sensation are intact and no gross focal deficits noted.  Psych: Alert, appropriate and with normal affect.   LABORATORY DATA:  EKG:    EKG is ordered today.  Personally reviewed by me. This demonstrates NSR with PVC - low voltage.  Lab Results  Component  Value Date   WBC 6.2 04/07/2018   HGB 11.5 (L) 04/07/2018   HCT 35.3 (L) 04/07/2018   PLT 234.0 04/07/2018   GLUCOSE 87 08/27/2018   CHOL 220 (H) 08/27/2018   TRIG 107 08/27/2018   HDL 88 08/27/2018   LDLDIRECT 107.0 06/30/2017   LDLCALC 111 (H) 08/27/2018   ALT 23 08/27/2018   AST 19 08/27/2018   NA 143 08/27/2018   K 4.4 08/27/2018   CL 108 (H) 08/27/2018   CREATININE 0.90 08/27/2018   BUN 18 08/27/2018   CO2 23 08/27/2018   TSH 2.31 08/23/2016   INR 1.00 02/11/2018   HGBA1C 6.2 (A) 05/06/2019   MICROALBUR <0.7 02/01/2015     BNP (last 3 results) No results for input(s): BNP in the last 8760 hours.  ProBNP (last 3 results) No results for input(s): PROBNP in the last 8760 hours.   Other Studies Reviewed Today:  ECHO 11/2016 Study Conclusions  - Left ventricle: The cavity size was normal. Wall thickness was increased in a pattern of mild LVH. Systolic function was normal. The estimated ejection fraction was in the range of 60% to 65%. Although no diagnostic regional wall motion abnormality was identified, this possibility cannot be completely excluded on the basis of this study. Doppler parameters are consistent with abnormal left ventricular relaxation (grade 1 diastolic dysfunction). - Aortic valve: There was no stenosis. - Mitral valve: There was no significant regurgitation. - Right ventricle: The cavity size was normal. Systolic function was normal. - Pulmonary arteries: No complete TR doppler jet so unable to estimate PA systolic pressure. - Systemic veins: IVC not visualized.  Impressions:  - Normal LV size with mild LV hypertrophy. EF 60-65%. Normal RV size and systolic function. No significant valvular abnormalities.    ASSESSMENT & PLAN:    1. History of PE/DVT - on life long anticoagulation - monitored by PCP. No problems noted.   2. Sarcoid - followed by pulmonary - she is on continuous oxygen.   3. HLD - labs  are checked by PCP  4. Diarrhea - may be having colonoscopy at some point - this would be ok from our standpoint - she has some stool studies pending.    Current medicines are reviewed with the patient today.  The patient does not have concerns regarding medicines other than what has been noted above.  The following changes have been made:  See above.  Labs/ tests ordered today include:    Orders Placed This Encounter  Procedures  . EKG 12-Lead     Disposition:   FU with Dr. Nishan  in 1 year.   Patient is agreeable to this plan and will call if any problems develop in the interim.   Signed:  , NP  08/30/2019 3:34 PM  [facility] [address] [place], [place]  [address] Phone: [phone] Fax: [phone]        

## 2019-08-24 NOTE — Progress Notes (Signed)
Addendum: Reviewed and agree with assessment and management plan. Stool studies recently submitted and pending Lester Platas, Lajuan Lines, MD

## 2019-08-25 LAB — FECAL LACTOFERRIN, QUANT
Fecal Lactoferrin: POSITIVE — AB
MICRO NUMBER:: 10802981
SPECIMEN QUALITY:: ADEQUATE

## 2019-08-25 LAB — GI PROFILE, STOOL, PCR

## 2019-08-26 ENCOUNTER — Telehealth: Payer: Self-pay | Admitting: Nurse Practitioner

## 2019-08-26 NOTE — Telephone Encounter (Signed)
Spoke with the patient. Advised the following; no infection found but other stool study suggests inflammatory process is present. She was going to talk to someone to see if they could take her for a colonoscopy. She cancelled the one in January due to transportation problems. Please tell her we need to proceed with the colonoscopy. It has to be done at the hospital due to 02 use. She can take Lomotil a couple of times a day in the meantime, hold for constipation and no lomotil for 5 days prior to colonoscopy.  Waiting for a date at the [facility] for Dr Hilarie Fredrickson.

## 2019-08-30 ENCOUNTER — Encounter: Payer: Self-pay | Admitting: Nurse Practitioner

## 2019-08-30 ENCOUNTER — Other Ambulatory Visit: Payer: Self-pay

## 2019-08-30 ENCOUNTER — Ambulatory Visit (INDEPENDENT_AMBULATORY_CARE_PROVIDER_SITE_OTHER): Payer: Medicare Other | Admitting: Nurse Practitioner

## 2019-08-30 ENCOUNTER — Other Ambulatory Visit: Payer: Self-pay | Admitting: Internal Medicine

## 2019-08-30 VITALS — BP 122/78 | HR 96 | Ht 67.0 in | Wt 275.0 lb

## 2019-08-30 DIAGNOSIS — I10 Essential (primary) hypertension: Secondary | ICD-10-CM | POA: Diagnosis not present

## 2019-08-30 DIAGNOSIS — E785 Hyperlipidemia, unspecified: Secondary | ICD-10-CM | POA: Diagnosis not present

## 2019-08-30 DIAGNOSIS — D869 Sarcoidosis, unspecified: Secondary | ICD-10-CM

## 2019-08-30 NOTE — Patient Instructions (Addendum)
After Visit Summary:  We will be checking the following labs today - NONE   Medication Instructions:    Continue with your current medicines.    If you need a refill on your cardiac medications before your next appointment, please call your pharmacy.     Testing/Procedures To Be Arranged:  N/A  Follow-Up:   See Dr. Johnsie Cancel in one year -  You will receive a reminder letter in the mail two months in advance. If you don't receive a letter, please call our office to schedule the follow-up appointment.     At [facility], you and your health needs are our priority.  As part of our continuing mission to provide you with exceptional heart care, we have created designated Provider Care Teams.  These Care Teams include your primary Cardiologist (physician) and Advanced Practice Providers (APPs -  Physician Assistants and Nurse Practitioners) who all work together to provide you with the care you need, when you need it.  Special Instructions:  . Stay safe, wash your hands for at least 20 seconds and wear a mask when needed.  . It was good to talk with you today.    Call the [place] office at [phone] if you have any questions, problems or concerns.

## 2019-09-02 NOTE — Unmapped (Signed)
[facility] Clinical Assessment & Refill Coordination Note    Carolyn Newton, DOB: [birth year]  Phone: [phone] (home)     All above HIPAA information was verified with patient.     Was a Nurse, learning disability used for this call? No    Specialty Medication(s):   Inflammatory Disorders: Humira     Current Outpatient Medications   Medication Sig Dispense Refill   ??? ADALIMUMAB PEN CITRATE FREE 40 MG/0.4 ML Inject the contents of 1 syringe (40 mg total) under the skin every seven (7) days. 4 each 11   ??? albuterol (PROVENTIL HFA;VENTOLIN HFA) 90 mcg/actuation inhaler Inhale 2 puffs.     ??? amoxicillin-clavulanate (AUGMENTIN) 875-125 mg per tablet Take 1 tablet by mouth every twelve (12) hours.     ??? amoxicillin-clavulanate (AUGMENTIN) 875-125 mg per tablet Take 1 tablet by mouth 2 (two) times daily. 42 tablet 0   ??? blood sugar diagnostic (FREESTYLE LITE STRIPS) Strp TEST TWICE DAILY     ??? blood sugar diagnostic (ON CALL EXPRESS TEST STRIP) Strp Use as instructed to check blood sugar once a day. 100 each 12   ??? blood-glucose meter (FREESTYLE LITE METER) kit Use to check blood sugar 2 times per day     ??? blood-glucose meter (ON CALL EXPRESS METER) Misc Use to check blood sugar once a day 1 each 0   ??? carvedilol (COREG) 12.5 MG tablet TAKE 1 TABLET BY MOUTH TWICE DAILY WITH A MEAL     ??? chlorhexidine (PERIDEX) 0.12 % solution 5 mL.     ??? clobetasoL (TEMOVATE) 0.05 % cream Apply topically Two (2) times a day. 60 g 5   ??? clonazePAM (KLONOPIN) 0.5 MG tablet      ??? dexlansoprazole (DEXILANT) 60 mg capsule      ??? diclofenac sodium (VOLTAREN) 1 % gel Apply 2 g topically 2 (two) times daily as needed (for pain). 100/4=25     ??? docusate sodium (COLACE) 100 MG capsule Take 100 mg by mouth.     ??? empty container (SHARPS CONTAINER) Misc Use as directed to dispose of Humira needles 1 each 2   ??? empty container Misc USE AS DIRECTED 1 each 2   ??? escitalopram oxalate (LEXAPRO) 20 MG tablet TAKE 1 TABLET BY MOUTH DAILY     ??? esomeprazole (NEXIUM) 40 MG capsule Take 40 mg by mouth.     ??? fluticasone propionate (FLONASE) 50 mcg/actuation nasal spray 1 spray into each nostril.     ??? furosemide (LASIX) 20 MG tablet Take 20 mg by mouth.     ??? gabapentin (NEURONTIN) 300 MG capsule Take 1 capsule (300 mg total) by mouth 2 (two) times daily as needed (for pain).  1   ??? hydrOXYzine (ATARAX) 25 MG tablet Take 1/4 tablet in the morning and 1/2 tablet in the evenings as needed for itch 30 tablet 5   ??? lancets (FREESTYLE) 28 gauge Misc 1 each.     ??? lancets 30 gauge Misc Use as instructed to test sugars once daily 100 each 12   ??? levoFLOXacin (LEVAQUIN) 750 MG tablet Take 750 mg by mouth.     ??? metFORMIN (GLUCOPHAGE) 500 MG tablet TAKE 2 TABLETS(1000 MG) BY MOUTH DAILY WITH FOOD     ??? moxifloxacin (AVELOX) 400 mg tablet Take 1 tablet (400 mg total) by mouth daily. 7 tablet 0   ??? naltrexone (DEPADE) 50 mg tablet Split tablets into quarters. Take one-quarter of a tablet once daily 30  tablet 11   ??? nortriptyline (PAMELOR) 75 MG capsule Take 75 mg by mouth.     ??? nystatin (MYCOSTATIN) 100,000 unit/mL suspension      ??? omeprazole (PRILOSEC) 20 MG capsule Take 20 mg by mouth.     ??? potassium chloride (KLOR-CON 10) 10 MEQ CR tablet TAKE 1 TABLET BY MOUTH DAILY     ??? rivaroxaban (XARELTO) 20 mg tablet TAKE 1 TABLET BY MOUTH EVERY DAY WITH SUPPER     ??? suvorexant (BELSOMRA) 20 mg tablet Take 20 mg by mouth.     ??? SYMBICORT 160-4.5 mcg/actuation inhaler Inhale 2 puffs into the lungs TWICE DAILY 120/4=30  5   ??? topiramate (TOPAMAX) 50 MG tablet TAKE 1 TABLET BY MOUTH TWICE DAILY     ??? venlafaxine (EFFEXOR-XR) 75 MG 24 hr capsule Take 75 mg by mouth.       Current Facility-Administered Medications   Medication Dose Route Frequency Provider Last Rate Last Admin   ??? triamcinolone acetonide (KENALOG) injection 10 mg  10 mg Intradermal Once Elsie Stain, MD            Changes to medications: Aleeya reports no changes at this time.    Allergies   Allergen Reactions   ??? Other Other (See Comments)     Patient has Sarcoidosis and can't tolerate any metals   ??? Suvorexant Other (See Comments)     Larey Seat out of bed while asleep: I'm waking up as I'm falling on the floor; Night terrors   ??? Enalapril Maleate      REACTION: cough   ??? Hydrochlorothiazide      REACTION: Low potassium   ??? Hydroxychloroquine Sulfate      REACTION: vision changes   ??? Iodine      Mother was intolerant--CODED on CT table---pt never tired.   ??? Latex      Hives   ??? Nickel Other (See Comments)     Pt has a titanium right and left knee - nickel causes skin irritations that forms into blisters and sores  Other reaction(s): Other (See Comments)  Pt has a titanium right knee - nickel causes skin irritations that forms into blisters and sores   ??? Adhesive Tape-Silicones Other (See Comments)     Medical tape causes bruising   ??? Doxycycline Diarrhea and Nausea And Vomiting   ??? Morphine Itching and Nausea Only   ??? Pregabalin Other (See Comments)     Made me feel high       Changes to allergies: No    SPECIALTY MEDICATION ADHERENCE     Humira 40/0.4 mg/ml: 12 days of medicine on hand       Medication Adherence    Patient reported X missed doses in the last month: 0  Specialty Medication: Humira 40 mg/0.4 ml  Informant: patient  Confirmed plan for next specialty medication refill: delivery by pharmacy  Refills needed for supportive medications: not needed          Specialty medication(s) dose(s) confirmed: Regimen is correct and unchanged.     Are there any concerns with adherence? No    Adherence counseling provided? Not needed    CLINICAL MANAGEMENT AND INTERVENTION      Clinical Benefit Assessment:    Do you feel the medicine is effective or helping your condition? Patient declined to answer    Clinical Benefit counseling provided? Not needed    Adverse Effects Assessment:    Are you experiencing any side effects? No  Are you experiencing difficulty administering your medicine? No    Quality of Life Assessment:    How many days over the past month did your HS  keep you from your normal activities? For example, brushing your teeth or getting up in the morning. 0    Have you discussed this with your provider? Not needed    Therapy Appropriateness:    Is therapy appropriate? Yes, therapy is appropriate and should be continued    DISEASE/MEDICATION-SPECIFIC INFORMATION      For patients on injectable medications: Patient currently has 1 doses left.  Next injection is scheduled for 09/08/19.    PATIENT SPECIFIC NEEDS     - Does the patient have any physical, cognitive, or cultural barriers? No    - Is the patient high risk? No    - Does the patient require a Care Management Plan? No     - Does the patient require physician intervention or other additional services (i.e. nutrition, smoking cessation, social work)? No      SHIPPING     Specialty Medication(s) to be Shipped:   Inflammatory Disorders: Humira    Other medication(s) to be shipped: Hydroxyzine 25 mg     Changes to insurance: No    Delivery Scheduled: Yes, Expected medication delivery date: 09/09/19.     Medication will be delivered via UPS to the confirmed prescription address in [facility].    The patient will receive a drug information handout for each medication shipped and additional FDA Medication Guides as required.  Verified that patient has previously received a Conservation officer, historic buildings.    All of the patient's questions and concerns have been addressed.    Lindalee Huizinga Vangie Bicker   [facility] Specialty Pharmacist

## 2019-09-08 MED FILL — HUMIRA PEN CITRATE FREE 40 MG/0.4 ML: SUBCUTANEOUS | 28 days supply | Qty: 4 | Fill #3

## 2019-09-08 MED FILL — HUMIRA PEN CITRATE FREE 40 MG/0.4 ML: 28 days supply | Qty: 4 | Fill #3 | Status: AC

## 2019-09-15 MED FILL — HYDROXYZINE HCL 25 MG TABLET: 40 days supply | Qty: 30 | Fill #0

## 2019-09-15 MED FILL — HYDROXYZINE HCL 25 MG TABLET: 40 days supply | Qty: 30 | Fill #0 | Status: AC

## 2019-09-16 ENCOUNTER — Ambulatory Visit: Payer: Medicare Other | Admitting: Sports Medicine

## 2019-09-21 ENCOUNTER — Other Ambulatory Visit: Payer: Self-pay | Admitting: Pulmonary Disease

## 2019-09-24 ENCOUNTER — Other Ambulatory Visit: Payer: Self-pay

## 2019-09-24 DIAGNOSIS — Z800 Family history of malignant neoplasm of digestive organs: Secondary | ICD-10-CM

## 2019-09-24 DIAGNOSIS — R197 Diarrhea, unspecified: Secondary | ICD-10-CM

## 2019-09-24 MED ORDER — SUPREP BOWEL PREP KIT 17.5-3.13-1.6 GM/177ML PO SOLN: ORAL | 0 refills | Status: DC

## 2019-09-24 NOTE — Telephone Encounter (Signed)
Pls call pt, she would like to r/s her colonoscopy at the hospital.

## 2019-09-24 NOTE — Telephone Encounter (Signed)
Patient is ready to schedule her colonoscopy. Agrees to 11/02/19 and will wait for her instructions to be mailed to her. She understands she will need a COVID test 10/29/19. She will call me with any questions or concerns.

## 2019-09-27 ENCOUNTER — Telehealth: Payer: Self-pay

## 2019-09-27 NOTE — Telephone Encounter (Signed)
[facility] Pre-operative Risk Assessment     Request for surgical clearance:   Endoscopy Procedure  What type of surgery is being performed?      colonoscopy  When is this surgery scheduled?      11/02/19  What type of clearance is required ?    Pharmacy  Are there any medications that need to be held prior to surgery and how long?  Xarelto to be held for 2 days  Practice name and name of physician performing surgery?      [facility] Dr Zenovia Jarred  What is your office phone and fax number?       Phone- [phone]  [phone]  Anesthesia type (None, local, MAC, general) ?        MAC

## 2019-09-28 NOTE — Telephone Encounter (Signed)
Patient is instructed. She says she wants to call Pulmonary herself and check.

## 2019-09-28 NOTE — Telephone Encounter (Signed)
Okay to hold Xarelto for the procedure.  Thanks

## 2019-09-30 ENCOUNTER — Telehealth: Payer: Self-pay | Admitting: Pulmonary Disease

## 2019-09-30 NOTE — Unmapped (Signed)
[facility] Refill Coordination Note    Specialty Medication(s) to be Shipped:   Inflammatory Disorders: Humira    Other medication(s) to be shipped: sharps container     Carolyn Newton, DOB: [birth year]  Phone: [phone] (home)       All above HIPAA information was verified with patient.     Was a Nurse, learning disability used for this call? No    Completed refill call assessment today to schedule patient's medication shipment from the [facility] [phone]).       Specialty medication(s) and dose(s) confirmed: Regimen is correct and unchanged.   Changes to medications: [name] reports no changes at this time.  Changes to insurance: No  Questions for the pharmacist: No    Confirmed patient received Welcome Packet with first shipment. The patient will receive a drug information handout for each medication shipped and additional FDA Medication Guides as required.       DISEASE/MEDICATION-SPECIFIC INFORMATION        For patients on injectable medications: Patient currently has 1 doses left.  Next injection is scheduled for 10/06/2019.    SPECIALTY MEDICATION ADHERENCE     Medication Adherence    Patient reported X missed doses in the last month: 0  Specialty Medication: Humira CF 40 mg/0.4 ml  Patient is on additional specialty medications: No  Any gaps in refill history greater than 2 weeks in the last 3 months: no  Demonstrates understanding of importance of adherence: yes  Informant: patient  Reliability of informant: reliable  Confirmed plan for next specialty medication refill: delivery by pharmacy  Refills needed for supportive medications: not needed                      SHIPPING     Shipping address confirmed in Epic.     Delivery Scheduled: Yes, Expected medication delivery date: 10/07/2019.     Medication will be delivered via UPS to the prescription address in Epic WAM.    Alaney Witter D Kelle Ruppert   [facility] Shared [facility] Specialty Technician

## 2019-09-30 NOTE — Telephone Encounter (Signed)
That would be okay and is routinely done before such a procedure

## 2019-09-30 NOTE — Telephone Encounter (Signed)
Called and spoke with pt letting her know that RA said it was fine for her to hold xarelto prior to her colonoscopy and she verbalized understanding. Nothing further needed.

## 2019-09-30 NOTE — Telephone Encounter (Signed)
Spoke with pt  She is calling as FYI to let Dr Elsworth Soho know that she is scheduled for colonoscopy for 10/31/19 and they advised that she hold Xarelto 20 mg x 2 days prior to her procedure

## 2019-10-06 MED FILL — EMPTY CONTAINER: 120 days supply | Qty: 1 | Fill #1 | Status: AC

## 2019-10-06 MED FILL — EMPTY CONTAINER: 120 days supply | Qty: 1 | Fill #1

## 2019-10-06 MED FILL — HUMIRA PEN CITRATE FREE 40 MG/0.4 ML: 28 days supply | Qty: 4 | Fill #4 | Status: AC

## 2019-10-06 MED FILL — HUMIRA PEN CITRATE FREE 40 MG/0.4 ML: SUBCUTANEOUS | 28 days supply | Qty: 4 | Fill #4

## 2019-10-11 ENCOUNTER — Other Ambulatory Visit: Payer: Self-pay | Admitting: Internal Medicine

## 2019-10-12 ENCOUNTER — Telehealth: Payer: Self-pay | Admitting: Internal Medicine

## 2019-10-12 ENCOUNTER — Telehealth: Payer: Self-pay | Admitting: Nurse Practitioner

## 2019-10-12 ENCOUNTER — Other Ambulatory Visit: Payer: Self-pay | Admitting: Internal Medicine

## 2019-10-12 NOTE — Telephone Encounter (Signed)
Hi Linda,  My mistake I re-routed to [employer].[name]

## 2019-10-12 NOTE — Telephone Encounter (Signed)
Do I need to do anything this this?

## 2019-10-12 NOTE — Telephone Encounter (Signed)
Error

## 2019-10-13 NOTE — Telephone Encounter (Signed)
Patient was contacted regarding her Covid test and explained to her that she is able to go at another time which is convenient for her transportation that day. If she did not make it to be tested her procedure would be cancelled. The patient verbalized understanding and will be able to go at 12pm

## 2019-10-14 ENCOUNTER — Other Ambulatory Visit: Payer: Self-pay

## 2019-10-14 ENCOUNTER — Ambulatory Visit (INDEPENDENT_AMBULATORY_CARE_PROVIDER_SITE_OTHER): Payer: Medicare Other | Admitting: Sports Medicine

## 2019-10-14 ENCOUNTER — Encounter: Payer: Self-pay | Admitting: Sports Medicine

## 2019-10-14 DIAGNOSIS — E1142 Type 2 diabetes mellitus with diabetic polyneuropathy: Secondary | ICD-10-CM | POA: Diagnosis not present

## 2019-10-14 DIAGNOSIS — M79675 Pain in left toe(s): Secondary | ICD-10-CM | POA: Diagnosis not present

## 2019-10-14 DIAGNOSIS — M79674 Pain in right toe(s): Secondary | ICD-10-CM | POA: Diagnosis not present

## 2019-10-14 DIAGNOSIS — M722 Plantar fascial fibromatosis: Secondary | ICD-10-CM

## 2019-10-14 DIAGNOSIS — M21619 Bunion of unspecified foot: Secondary | ICD-10-CM

## 2019-10-14 DIAGNOSIS — M79671 Pain in right foot: Secondary | ICD-10-CM

## 2019-10-14 DIAGNOSIS — B351 Tinea unguium: Secondary | ICD-10-CM | POA: Diagnosis not present

## 2019-10-14 DIAGNOSIS — M79672 Pain in left foot: Secondary | ICD-10-CM

## 2019-10-14 DIAGNOSIS — L439 Lichen planus, unspecified: Secondary | ICD-10-CM

## 2019-10-14 MED ORDER — LIDOCAINE 5 % EX OINT
1.0000 | TOPICAL_OINTMENT | CUTANEOUS | 0 refills | Status: DC | PRN
Start: 2019-10-14 — End: 2020-08-19

## 2019-10-14 NOTE — Progress Notes (Signed)
Subjective: [name] is a 59 y.o. female patient with history of diabetes who presents to office today complaining of long,mildly painful nails  while ambulating in shoes; unable to trim. Patient states that the glucose reading this morning was not recorded, not feeling well lots of GI issues and needs to get a colonscopy, Patient denies any new changes in medication or new problems. No other issues noted.   Patient Active Problem List   Diagnosis Date Noted  . Diarrhea 07/06/2019  . Shoulder pain, bilateral 02/01/2019  . Physical deconditioning 01/28/2019  . Chronic anticoagulation 11/19/2018  . Insulin dependent diabetes mellitus type IA ([place]) 11/19/2018  . Oxygen dependent 11/19/2018  . Family history of colon cancer 10/26/2018  . Myringotomy tube status 08/21/2018  . Vaginal candidiasis 07/15/2018  . Nasal crusting 06/12/2018  . Chronic serous otitis media of left ear 06/04/2018  . Mixed conductive and sensorineural hearing loss of left ear with restricted hearing of right ear 06/04/2018  . Chronic cholecystitis with calculus 02/11/2018  . Thrush, oral 01/15/2018  . Nasal sinus congestion 01/15/2018  . Dyslipidemia 10/28/2017  . Pruritus 10/23/2017  . Abnormal liver function test   . Anemia 09/24/2017  . Migraines 09/17/2017  . Cholecystitis, acute 09/12/2017  . COPD (chronic obstructive pulmonary disease) ([place]) 12/30/2016  . Diastolic dysfunction 67/20/9470  . OSA (obstructive sleep apnea) 09/30/2016  . Chronic respiratory failure with hypoxia ([place]) 08/23/2016  . Pulmonary embolism ([place]) 07/21/2016  . DVT (deep venous thrombosis) ([place]) 07/21/2016  . Weight gain 12/20/2015  . Controlled type 2 diabetes mellitus with chronic kidney disease, without long-term current use of insulin ([place]) 02/01/2015  . Abnormal SPEP 06/04/2013  . Memory loss 04/27/2013  . Panic attacks 03/17/2013  . Rash 10/27/2012  . Pulmonary nodules 07/20/2012  . CAD in native artery  05/28/2012  . Seborrheic dermatitis 01/20/2012  . Obesity, Class III, BMI 40-49.9 (morbid obesity) ([place]) 03/07/2010  . INSOMNIA, CHRONIC 03/07/2010  . Allergic rhinitis 10/26/2009  . Essential hypertension 08/27/2007  . B12 deficiency 08/25/2007  . Vitamin D deficiency 08/25/2007  . Urinary tract infection 02/07/2007  . SINUSITIS, ACUTE 02/02/2007  . Osteoarthritis 02/02/2007  . Sarcoidosis 08/14/2006  . Anxiety state 08/14/2006  . Major depressive disorder, recurrent episode ([place]) 08/14/2006  . GERD 08/14/2006   Current Outpatient Medications on File Prior to Visit  Medication Sig Dispense Refill  . albuterol (VENTOLIN HFA) 108 (90 Base) MCG/ACT inhaler Inhale 2 puffs into the lungs every 6 (six) hours as needed for wheezing or shortness of breath. 8.5 g 0  . B-D ULTRA-FINE 33 LANCETS MISC Use as instructed to test sugars once  daily 100 each 12  . budesonide-formoterol (SYMBICORT) 160-4.5 MCG/ACT inhaler Inhale 2 puffs into the lungs TWICE DAILY 10.2 g 2  . carvedilol (COREG) 12.5 MG tablet Take 1 tablet (12.5 mg total) by mouth 2 (two) times daily with a meal. Annual appt due in June must see provider for future refills 60 tablet 0  . clonazePAM (KLONOPIN) 0.5 MG tablet Take 1 TO 2 tablets (0.5-1 mg total) by mouth at bedtime as needed for anxiety. 60 tablet 3  . DEXILANT 60 MG capsule Take 1 capsule (60 mg total) by mouth daily. 90 capsule 2  . diclofenac Sodium (VOLTAREN) 1 % GEL Apply 2 g topically 2 (two) times daily as needed (for pain). 100/4=25    . diphenoxylate-atropine (LOMOTIL) 2.5-0.025 MG tablet Take 1-2 tablets by mouth 4 (four) times daily as needed for diarrhea or  loose stools. 60 tablet 0  . furosemide (LASIX) 40 MG tablet TAKE 1 TABLET BY MOUTH EVERY MORNING FOR EDEMA (Patient taking differently: Take 40 mg by mouth daily as needed for fluid or edema. ) 90 tablet 1  . gabapentin (NEURONTIN) 300 MG capsule Take 1 capsule (300 mg total) by mouth 2 (two) times daily as  needed (for pain). 180 capsule 0  . glucose blood (ACCU-CHEK GUIDE) test strip Use to check blood sugar once a day. 100 each 12  . HUMIRA PEN 40 MG/0.4ML PNKT SMARTSIG:40 Milligram(s) SUB-Q Once a Week    . hydrOXYzine (ATARAX/VISTARIL) 25 MG tablet Take 1 tablet (25 mg total) by mouth every 8 (eight) hours as needed for itching. Follow=-up appt is due must see provider for future refills 60 tablet 1  . metFORMIN (GLUCOPHAGE-XR) 500 MG 24 hr tablet Take 2 tablets (1,000 mg total) by mouth at bedtime. 180 tablet 2  . naltrexone (DEPADE) 50 MG tablet Split tablets into quarters. Take one-quarter of a tablet once daily    . OXYGEN Inhale into the lungs. Takes 2 liters every day (24/7)    . potassium chloride (KLOR-CON) 10 MEQ tablet Take 1 tablet (10 mEq total) by mouth daily. Annual appt due in June must see provider for future refills 30 tablet 0  . SUPREP BOWEL PREP KIT 17.5-3.13-1.6 GM/177ML SOLN SMARTSIG:1 Kit(s) By Mouth Once 540 mL 0  . topiramate (TOPAMAX) 50 MG tablet Take 1 tablet (50 mg total) by mouth 2 (two) times daily. 180 tablet 3  . UNABLE TO FIND Use as instructed to check blood sugar once a day.    . venlafaxine XR (EFFEXOR-XR) 75 MG 24 hr capsule Take 1 capsule (75 mg total) by mouth daily with breakfast. 30 capsule 11  . XARELTO 20 MG TABS tablet Take 1 tablet (20 mg total) by mouth at bedtime. 90 tablet 3  . [DISCONTINUED] clonazePAM (KLONOPIN) 0.5 MG tablet Take 1 TO 2 tablets (0.5-1 mg total) by mouth at bedtime as needed for anxiety. (Patient taking differently: Take 0.5 mg by mouth at bedtime. ) 60 tablet 3   No current facility-administered medications on file prior to visit.   Allergies  Allergen Reactions  . Belsomra [Suvorexant] Other (See Comments)    [place] out of bed while asleep: "I'm waking up as I'm falling on the floor;" "Night terrors"  . Enalapril Maleate Cough  . Latex Hives  . Nickel Other (See Comments)    Blisters  Pt has a titanium right and left knee -  nickel causes skin irritations that form into blisters and sores  . Other Other (See Comments)    Patient has Sarcoidosis and can't tolerate any metals  . Morphine And Related Itching and Nausea Only  . Doxycycline Diarrhea and Nausea And Vomiting  . Hydrochlorothiazide Other (See Comments)    Low potassium levels   . Hydroxychloroquine Sulfate Other (See Comments)    Vision changes   . Lyrica [Pregabalin] Other (See Comments)    "Made me feel high"  . Tape Other (See Comments)    Medical tape causes bruising    Recent Results (from the past 2160 hour(s))  GI Profile, Stool, PCR     Status: None   Collection Time: 08/23/19  2:14 PM  Result Value Ref Range   Campylobacter Not Detected Not Detected   C difficile toxin A/B Not Detected Not Detected   Plesiomonas shigelloides Not Detected Not Detected   Salmonella Not Detected Not Detected  Vibrio Not Detected Not Detected   Vibrio cholerae Not Detected Not Detected   Yersinia enterocolitica Not Detected Not Detected   Enteroaggregative E coli Not Detected Not Detected   Enteropathogenic E coli Not Detected Not Detected   Enterotoxigenic E coli Not Detected Not Detected   Shiga-toxin-producing E coli Not Detected Not Detected   E coli S962 Not applicable Not Detected   Shigella/Enteroinvasive E coli Not Detected Not Detected   Cryptosporidium Not Detected Not Detected   Cyclospora cayetanensis Not Detected Not Detected   Entamoeba histolytica Not Detected Not Detected   Giardia lamblia Not Detected Not Detected   Adenovirus F 40/41 Not Detected Not Detected   Astrovirus Not Detected Not Detected   Norovirus GI/GII Not Detected Not Detected   Rotavirus A Not Detected Not Detected   Sapovirus Not Detected Not Detected  Fecal lactoferrin, quant     Status: Abnormal   Collection Time: 08/23/19  2:14 PM  Result Value Ref Range   MICRO NUMBER: [id number]    SPECIMEN QUALITY: Adequate    Source STOOL    STATUS: FINAL    Fecal  Lactoferrin Positive (A)     Comment: Positive   COMMENT:      Lactoferrin in the stool is a marker for fecal leukocytes and is a non-specific indicator of intestinal inflammation that may be detected in patients with acute infectious colitis or inflammatory bowel disease. The diagnosis of an acute infectious  process or active IBD cannot be established solely on the basis of a positive result. This test may not be appropriate for immunocompromised persons. In addition, this test is not FDA cleared for patients with a history of HIV and/or Hepatitis B and C,  patients with a history of infectious diarrhea (within 6 months), and patients having had a colostomy and/or ileostomy within 1 month.     Objective: General: Patient is awake, alert, and oriented x 3 and in no acute distress.  Integument: Skin is warm, dry and supple bilateral. Nails are tender, long, thickened and dystrophic with subungual debris, consistent with onychomycosis, 1-5 bilateral. No signs of infection. No open lesions or preulcerative lesions present bilateral. Small hyperpigmented patches bilateral, hx of lichen planus unchanged. Remaining integument unremarkable.  Vasculature:  Dorsalis Pedis pulse 1/4 bilateral. Posterior Tibial pulse  0/4 bilateral. Capillary fill time <3 sec 1-5 bilateral. Positive hair growth to the level of the digits. Temperature gradient within normal limits. No varicosities present bilateral. Trace edema present bilateral.   Neurology: The patient has diminished sensation measured with a 5.07/10g Semmes Weinstein Monofilament at all pedal sites bilateral . Vibratory sensation diminished bilateral with tuning fork. No Babinski sign present bilateral.   Musculoskeletal: Asymptomatic pes planus pedal deformities noted bilateral. Chronic toe pain R>L and heel pain bilateral. Muscular strength 5/5 in all lower extremity muscular groups bilateral without pain on range of motion. No tenderness with calf  compression bilateral.  Assessment and Plan: Problem List Items Addressed This Visit    None    Visit Diagnoses    Pain due to onychomycosis of toenails of both feet    -  Primary   Diabetic polyneuropathy associated with type 2 diabetes mellitus (HCC)       Lichen planus       Bunion       Plantar fasciitis, bilateral       Pain in both feet         -Examined patient. -Re-Discussed and educated patient on diabetic foot care,  especially with regards to the vascular, neurological and musculoskeletal systems.  -Mechanically debrided all nails 1-5 bilateral using sterile nail nipper and filed with dremel without incident  -Continue with diabetic shoes and bunion pads -Rx topical lidocaine to use as needed for pain  -Answered all patient questions -Patient to return  in 3 months for at risk foot care -Patient advised to call the office if any problems or questions arise in the meantime.  Landis Martins, DPM

## 2019-10-20 ENCOUNTER — Telehealth: Payer: Self-pay | Admitting: Sports Medicine

## 2019-10-20 NOTE — Telephone Encounter (Signed)
I will look for the prior auth information from the pharmacy via fax regarding this medication. This is the standard process; the pharmacy will fax the [place] for me to discuss with insurance

## 2019-10-20 NOTE — Telephone Encounter (Signed)
Pt called and stated that Dr. Cannon Kettle needed to reach out to [employer] stating why the patient needs the ointment for her feet in order for them to cover. Please call patient

## 2019-10-20 NOTE — Telephone Encounter (Signed)
Ok great, thank you. 

## 2019-10-21 NOTE — Telephone Encounter (Signed)
FYI, I completed the prior auth for her medication, if patient calls about the medication she needs to follow up with her pharmacy Thanks Dr. Chauncey Cruel

## 2019-10-27 ENCOUNTER — Other Ambulatory Visit: Payer: Self-pay | Admitting: Internal Medicine

## 2019-10-27 NOTE — Telephone Encounter (Signed)
1.Medication Requested:  gabapentin (NEURONTIN) 300 MG capsule  2. Pharmacy (Name, Street, [place]):   [place], [place] Phone:  [phone]  Fax:  [phone]      3. On Med List: Y  4. Last Visit with PCP: 07/06/2019  5. Next visit date with PCP:  11/09/19   Agent: Please be advised that RX refills may take up to 3 business days. We ask that you follow-up with your pharmacy.

## 2019-10-28 NOTE — Telephone Encounter (Signed)
Ok great, thank you. 

## 2019-10-29 ENCOUNTER — Other Ambulatory Visit (HOSPITAL_COMMUNITY): Payer: Medicare Other

## 2019-10-29 ENCOUNTER — Other Ambulatory Visit (HOSPITAL_COMMUNITY)
Admission: RE | Admit: 2019-10-29 | Discharge: 2019-10-29 | Disposition: A | Discharge status: Home / Self Care | Payer: Medicare Other | Source: Ambulatory Visit | Attending: Internal Medicine | Admitting: Internal Medicine

## 2019-10-29 DIAGNOSIS — Z01812 Encounter for preprocedural laboratory examination: Secondary | ICD-10-CM | POA: Insufficient documentation

## 2019-10-29 DIAGNOSIS — Z20822 Contact with and (suspected) exposure to covid-19: Secondary | ICD-10-CM | POA: Insufficient documentation

## 2019-10-29 LAB — SARS CORONAVIRUS 2 (TAT 6-24 HRS): SARS Coronavirus 2: NEGATIVE

## 2019-10-29 MED ORDER — GABAPENTIN 300 MG PO CAPS: 300.0000 mg | ORAL_CAPSULE | ORAL | 0 refills | Status: DC

## 2019-10-29 NOTE — Telephone Encounter (Signed)
FAXED [place]

## 2019-10-29 NOTE — Addendum Note (Signed)
Addended by: Earnstine Regal on: 10/29/2019 03:24 PM   Modules accepted: Orders

## 2019-10-29 NOTE — Telephone Encounter (Signed)
Reviewed chart pt is up-to-date sent refills to [facility].../lmb  

## 2019-11-02 ENCOUNTER — Other Ambulatory Visit: Payer: Self-pay

## 2019-11-02 ENCOUNTER — Encounter (HOSPITAL_COMMUNITY)
Admission: RE | Disposition: A | Discharge status: Home / Self Care | Payer: Self-pay | Source: Home / Self Care | Attending: Internal Medicine

## 2019-11-02 ENCOUNTER — Ambulatory Visit (HOSPITAL_COMMUNITY)
Admission: RE | Admit: 2019-11-02 | Discharge: 2019-11-02 | Disposition: A | Discharge status: Home / Self Care | Payer: Medicare Other | Attending: Internal Medicine | Admitting: Internal Medicine

## 2019-11-02 ENCOUNTER — Ambulatory Visit (HOSPITAL_COMMUNITY): Payer: Medicare Other | Admitting: Certified Registered Nurse Anesthetist

## 2019-11-02 ENCOUNTER — Encounter (HOSPITAL_COMMUNITY): Payer: Self-pay | Admitting: Internal Medicine

## 2019-11-02 DIAGNOSIS — Z800 Family history of malignant neoplasm of digestive organs: Secondary | ICD-10-CM | POA: Diagnosis not present

## 2019-11-02 DIAGNOSIS — M1990 Unspecified osteoarthritis, unspecified site: Secondary | ICD-10-CM | POA: Diagnosis not present

## 2019-11-02 DIAGNOSIS — Z87891 Personal history of nicotine dependence: Secondary | ICD-10-CM | POA: Diagnosis not present

## 2019-11-02 DIAGNOSIS — D125 Benign neoplasm of sigmoid colon: Secondary | ICD-10-CM | POA: Diagnosis not present

## 2019-11-02 DIAGNOSIS — F32A Depression, unspecified: Secondary | ICD-10-CM | POA: Diagnosis not present

## 2019-11-02 DIAGNOSIS — G4700 Insomnia, unspecified: Secondary | ICD-10-CM | POA: Diagnosis not present

## 2019-11-02 DIAGNOSIS — Z885 Allergy status to narcotic agent status: Secondary | ICD-10-CM | POA: Insufficient documentation

## 2019-11-02 DIAGNOSIS — D122 Benign neoplasm of ascending colon: Secondary | ICD-10-CM | POA: Diagnosis not present

## 2019-11-02 DIAGNOSIS — I2510 Atherosclerotic heart disease of native coronary artery without angina pectoris: Secondary | ICD-10-CM | POA: Insufficient documentation

## 2019-11-02 DIAGNOSIS — Z823 Family history of stroke: Secondary | ICD-10-CM | POA: Insufficient documentation

## 2019-11-02 DIAGNOSIS — D869 Sarcoidosis, unspecified: Secondary | ICD-10-CM | POA: Diagnosis not present

## 2019-11-02 DIAGNOSIS — E119 Type 2 diabetes mellitus without complications: Secondary | ICD-10-CM | POA: Diagnosis not present

## 2019-11-02 DIAGNOSIS — Z1211 Encounter for screening for malignant neoplasm of colon: Secondary | ICD-10-CM | POA: Diagnosis not present

## 2019-11-02 DIAGNOSIS — K529 Noninfective gastroenteritis and colitis, unspecified: Secondary | ICD-10-CM

## 2019-11-02 DIAGNOSIS — Z794 Long term (current) use of insulin: Secondary | ICD-10-CM | POA: Insufficient documentation

## 2019-11-02 DIAGNOSIS — Z8673 Personal history of transient ischemic attack (TIA), and cerebral infarction without residual deficits: Secondary | ICD-10-CM | POA: Insufficient documentation

## 2019-11-02 DIAGNOSIS — K5730 Diverticulosis of large intestine without perforation or abscess without bleeding: Secondary | ICD-10-CM | POA: Insufficient documentation

## 2019-11-02 DIAGNOSIS — M797 Fibromyalgia: Secondary | ICD-10-CM | POA: Insufficient documentation

## 2019-11-02 DIAGNOSIS — R0602 Shortness of breath: Secondary | ICD-10-CM | POA: Insufficient documentation

## 2019-11-02 DIAGNOSIS — R197 Diarrhea, unspecified: Secondary | ICD-10-CM

## 2019-11-02 DIAGNOSIS — G4733 Obstructive sleep apnea (adult) (pediatric): Secondary | ICD-10-CM | POA: Insufficient documentation

## 2019-11-02 DIAGNOSIS — Z7901 Long term (current) use of anticoagulants: Secondary | ICD-10-CM | POA: Insufficient documentation

## 2019-11-02 DIAGNOSIS — I10 Essential (primary) hypertension: Secondary | ICD-10-CM | POA: Insufficient documentation

## 2019-11-02 DIAGNOSIS — K219 Gastro-esophageal reflux disease without esophagitis: Secondary | ICD-10-CM | POA: Insufficient documentation

## 2019-11-02 DIAGNOSIS — Z803 Family history of malignant neoplasm of breast: Secondary | ICD-10-CM | POA: Insufficient documentation

## 2019-11-02 DIAGNOSIS — R519 Headache, unspecified: Secondary | ICD-10-CM | POA: Insufficient documentation

## 2019-11-02 DIAGNOSIS — Z91048 Other nonmedicinal substance allergy status: Secondary | ICD-10-CM | POA: Insufficient documentation

## 2019-11-02 DIAGNOSIS — F419 Anxiety disorder, unspecified: Secondary | ICD-10-CM | POA: Insufficient documentation

## 2019-11-02 DIAGNOSIS — Z91040 Latex allergy status: Secondary | ICD-10-CM | POA: Insufficient documentation

## 2019-11-02 DIAGNOSIS — D649 Anemia, unspecified: Secondary | ICD-10-CM | POA: Diagnosis not present

## 2019-11-02 DIAGNOSIS — Z9981 Dependence on supplemental oxygen: Secondary | ICD-10-CM | POA: Diagnosis not present

## 2019-11-02 DIAGNOSIS — E559 Vitamin D deficiency, unspecified: Secondary | ICD-10-CM | POA: Insufficient documentation

## 2019-11-02 DIAGNOSIS — Z888 Allergy status to other drugs, medicaments and biological substances status: Secondary | ICD-10-CM | POA: Insufficient documentation

## 2019-11-02 DIAGNOSIS — G4730 Sleep apnea, unspecified: Secondary | ICD-10-CM | POA: Diagnosis not present

## 2019-11-02 DIAGNOSIS — Z9049 Acquired absence of other specified parts of digestive tract: Secondary | ICD-10-CM | POA: Insufficient documentation

## 2019-11-02 DIAGNOSIS — D124 Benign neoplasm of descending colon: Secondary | ICD-10-CM

## 2019-11-02 DIAGNOSIS — K635 Polyp of colon: Secondary | ICD-10-CM | POA: Diagnosis not present

## 2019-11-02 DIAGNOSIS — Z881 Allergy status to other antibiotic agents status: Secondary | ICD-10-CM | POA: Insufficient documentation

## 2019-11-02 DIAGNOSIS — Z96653 Presence of artificial knee joint, bilateral: Secondary | ICD-10-CM | POA: Insufficient documentation

## 2019-11-02 DIAGNOSIS — R351 Nocturia: Secondary | ICD-10-CM | POA: Diagnosis not present

## 2019-11-02 DIAGNOSIS — Z8249 Family history of ischemic heart disease and other diseases of the circulatory system: Secondary | ICD-10-CM | POA: Insufficient documentation

## 2019-11-02 DIAGNOSIS — M069 Rheumatoid arthritis, unspecified: Secondary | ICD-10-CM | POA: Insufficient documentation

## 2019-11-02 HISTORY — PX: COLONOSCOPY WITH PROPOFOL: SHX5780

## 2019-11-02 HISTORY — DX: Other specified postprocedural states: R11.2

## 2019-11-02 HISTORY — PX: BIOPSY: SHX5522

## 2019-11-02 HISTORY — PX: POLYPECTOMY: SHX5525

## 2019-11-02 HISTORY — DX: Nausea with vomiting, unspecified: Z98.890

## 2019-11-02 LAB — GLUCOSE, CAPILLARY: Glucose-Capillary: 109 mg/dL — ABNORMAL HIGH (ref 70–99)

## 2019-11-02 SURGERY — COLONOSCOPY WITH PROPOFOL
Anesthesia: Monitor Anesthesia Care

## 2019-11-02 MED ORDER — SODIUM CHLORIDE 0.9 % IV SOLN: INTRAVENOUS | Status: DC

## 2019-11-02 MED ORDER — ONDANSETRON HCL 4 MG/2ML IJ SOLN
INTRAMUSCULAR | Status: DC | PRN
  Administered 2019-11-02: 4 mg via INTRAVENOUS

## 2019-11-02 MED ORDER — LACTATED RINGERS IV SOLN: INTRAVENOUS | Status: DC

## 2019-11-02 MED ORDER — PROPOFOL 10 MG/ML IV BOLUS
INTRAVENOUS | Status: DC | PRN
  Administered 2019-11-02: 40 mg via INTRAVENOUS

## 2019-11-02 MED ORDER — PROPOFOL 10 MG/ML IV BOLUS
INTRAVENOUS | Status: AC
  Filled 2019-11-02: qty 20

## 2019-11-02 MED ORDER — PROPOFOL 500 MG/50ML IV EMUL
INTRAVENOUS | Status: DC | PRN
  Administered 2019-11-02: 75 ug/kg/min via INTRAVENOUS

## 2019-11-02 SURGICAL SUPPLY — 21 items

## 2019-11-02 NOTE — Unmapped (Signed)
[facility] Refill Coordination Note    Specialty Medication(s) to be Shipped:   Inflammatory Disorders: Humira    Other medication(s) to be shipped: No additional medications requested for fill at this time     Carolyn Newton, DOB: 05-17-1960  Phone: [phone] (home)       All above HIPAA information was verified with patient.     Was a Nurse, learning disability used for this call? No    Completed refill call assessment today to schedule patient's medication shipment from the [facility] [phone]).       Specialty medication(s) and dose(s) confirmed: Regimen is correct and unchanged.   Changes to medications: Leoda reports no changes at this time.  Changes to insurance: No  Questions for the pharmacist: No    Confirmed patient received Welcome Packet with first shipment. The patient will receive a drug information handout for each medication shipped and additional FDA Medication Guides as required.       DISEASE/MEDICATION-SPECIFIC INFORMATION        For patients on injectable medications: Patient currently has 2 or 3 (pt unsure) doses left.  Next injection is scheduled for 11/03/2019.    SPECIALTY MEDICATION ADHERENCE     Medication Adherence    Patient reported X missed doses in the last month: 0  Specialty Medication: Humira CF 40 mg/0.4 ml  Patient is on additional specialty medications: No  Any gaps in refill history greater than 2 weeks in the last 3 months: no  Demonstrates understanding of importance of adherence: yes  Informant: patient  Reliability of informant: reliable  Confirmed plan for next specialty medication refill: delivery by pharmacy  Refills needed for supportive medications: not needed                      SHIPPING     Shipping address confirmed in Epic.     Delivery Scheduled: Yes, Expected medication delivery date: 11/10/2019.     Medication will be delivered via UPS to the prescription address in Epic WAM.    Celestial Barnfield D Malli Falotico   [facility] Shared [facility] Specialty Technician

## 2019-11-02 NOTE — Anesthesia Preprocedure Evaluation (Addendum)
Anesthesia Evaluation  Patient identified by MRN, date of birth, ID band Patient awake    Reviewed: Allergy & Precautions, NPO status , Patient's Chart, lab work & pertinent test results  History of Anesthesia Complications (+) PONV and history of anesthetic complications  Airway Mallampati: II  TM Distance: >3 FB Neck ROM: Full    Dental  (+) Edentulous Upper, Partial Lower, Dental Advisory Given   Pulmonary shortness of breath, sleep apnea , pneumonia, former smoker,    Pulmonary exam normal breath sounds clear to auscultation       Cardiovascular hypertension, Pt. on medications and Pt. on home beta blockers (-) angina+ CAD  (-) Past MI Normal cardiovascular exam Rhythm:Regular Rate:Normal  Echo 2018 - Left ventricle: The cavity size was normal. Wall thickness was increased in a pattern of mild LVH. Systolic function was normal. The estimated ejection fraction was in the range of 60% to 65%. Although no diagnostic regional wall motion abnormality was identified, this possibility cannot be completely excluded on the basis of this study. Doppler parameters are consistent with abnormal left ventricular relaxation (grade 1 diastolic  dysfunction).  - Aortic valve: There was no stenosis.  - Mitral valve: There was no significant regurgitation.  - Right ventricle: The cavity size was normal. Systolic functionwas normal.  - Pulmonary arteries: No complete TR doppler jet so unable to estimate PA systolic pressure.  - Systemic veins: IVC not visualized.    Neuro/Psych  Headaches, PSYCHIATRIC DISORDERS Anxiety Depression TIA   GI/Hepatic Neg liver ROS, GERD  Medicated,  Endo/Other  diabetes, Type 2, Oral Hypoglycemic AgentsObesity   Renal/GU Renal disease     Musculoskeletal  (+) Arthritis , Osteoarthritis,  Fibromyalgia -  Abdominal (+) - obese,   Peds  Hematology negative hematology ROS (+) anemia ,   Anesthesia Other  Findings Day of surgery medications reviewed with the patient.  Sarcoidosis. Took self off prednisone 2 yrs ago.  Reproductive/Obstetrics                            Anesthesia Physical  Anesthesia Plan  ASA: III  Anesthesia Plan: MAC   Post-op Pain Management:    Induction: Intravenous  PONV Risk Score and Plan: 3 and Propofol infusion, TIVA and Treatment may vary due to age or medical condition  Airway Management Planned: Simple Face Mask  Additional Equipment:   Intra-op Plan:   Post-operative Plan:   Informed Consent: I have reviewed the patients History and Physical, chart, labs and discussed the procedure including the risks, benefits and alternatives for the proposed anesthesia with the patient or authorized representative who has indicated his/her understanding and acceptance.     Dental advisory given  Plan Discussed with: CRNA  Anesthesia Plan Comments:        Anesthesia Quick Evaluation                                  Anesthesia Evaluation  Patient identified by MRN, date of birth, ID band Patient awake    Reviewed: Allergy & Precautions, NPO status , Patient's Chart, lab work & pertinent test results, reviewed documented beta blocker date and time   Airway Mallampati: II  TM Distance: >3 FB Neck ROM: Full    Dental  (+) Partial Upper, Dental Advisory Given   Pulmonary sleep apnea , former smoker,  Denies home O2 or CPAP  Pulmonary exam normal       Cardiovascular hypertension, Pt. on home beta blockers and Pt. on medications + CAD Normal cardiovascular exam    Neuro/Psych Anxiety Depression TIA   GI/Hepatic GERD-  ,  Endo/Other  diabetes, Well Controlled, Type 2, Oral Hypoglycemic AgentsMorbid obesity  Renal/GU      Musculoskeletal  (+) Arthritis -, Fibromyalgia -  Abdominal   Peds  Hematology   Anesthesia Other Findings Sarcoidosis.  Took self off prednisone 2 yrs ago.   Reproductive/Obstetrics                           Anesthesia Physical Anesthesia Plan  ASA: III  Anesthesia Plan: General   Post-op Pain Management:    Induction: Intravenous  Airway Management Planned: LMA and Oral ETT  Additional Equipment:   Intra-op Plan:   Post-operative Plan: Extubation in OR  Informed Consent: I have reviewed the patients History and Physical, chart, labs and discussed the procedure including the risks, benefits and alternatives for the proposed anesthesia with the patient or authorized representative who has indicated his/her understanding and acceptance.     Plan Discussed with: CRNA, Anesthesiologist and Surgeon  Anesthesia Plan Comments:         Anesthesia Quick Evaluation

## 2019-11-02 NOTE — Transfer of Care (Signed)
Immediate Anesthesia Transfer of Care Note  Patient: [name] [facility]  Procedure(s) Performed: COLONOSCOPY WITH PROPOFOL (N/A ) BIOPSY POLYPECTOMY  Patient Location: PACU and Endoscopy Unit  Anesthesia Type:MAC  Level of Consciousness: awake, alert  and oriented  Airway & Oxygen Therapy: Patient Spontanous Breathing and Patient connected to face mask oxygen  Post-op Assessment: Report given to RN and Post -op Vital signs reviewed and stable  Post vital signs: Reviewed and stable  Last Vitals:  Vitals Value Taken Time  BP    Temp    Pulse    Resp    SpO2      Last Pain:  Vitals:   11/02/19 1118  TempSrc: Oral  PainSc: 0-No pain         Complications: No complications documented.

## 2019-11-02 NOTE — Anesthesia Procedure Notes (Signed)
Procedure Name: MAC Date/Time: 11/02/2019 11:31 AM Performed by: Maxwell Caul, CRNA Pre-anesthesia Checklist: Patient identified, Emergency Drugs available, Suction available and Patient being monitored Oxygen Delivery Method: Simple face mask

## 2019-11-02 NOTE — Discharge Instructions (Signed)

## 2019-11-02 NOTE — Op Note (Signed)
[facility] Patient Name: [name] Procedure Date: 11/02/2019 MRN: [id number] Attending MD: Jerene Bears , MD Date of Birth: Jan 22, 1960 CSN: [id number] Age: 59 Admit Type: Outpatient Procedure:                Colonoscopy Indications:              Screening in patient at increased risk: Family                            history of 1st-degree relative with colorectal                            cancer before age 40 years, This is the patient's                            first colonoscopy, also evaluation for chronic                            diarrhea with negative infectious panel but                            positive fecal lactoferrin Providers:                Lajuan Lines. Hilarie Fredrickson, MD, Carmie End, RN, Erenest Rasher, RN, Elspeth Cho Tech., Technician,                            Lesia Sago, Technician Referring MD:             Evie Lacks. Plotnikov MD, MD Medicines:                Monitored Anesthesia Care Complications:            No immediate complications. Estimated Blood Loss:     Estimated blood loss was minimal. Procedure:                Pre-Anesthesia Assessment:                           - Prior to the procedure, a History and Physical                            was performed, and patient medications and                            allergies were reviewed. The patient's tolerance of                            previous anesthesia was also reviewed. The risks                            and benefits of the procedure and the sedation  options and risks were discussed with the patient.                            All questions were answered, and informed consent                            was obtained. Prior Anticoagulants: The patient has                            taken Xarelto (rivaroxaban), last dose was 2 days                            prior to procedure. ASA Grade Assessment: III - A                             patient with severe systemic disease. After                            reviewing the risks and benefits, the patient was                            deemed in satisfactory condition to undergo the                            procedure.                           After obtaining informed consent, the colonoscope                            was passed under direct vision. Throughout the                            procedure, the patient's blood pressure, pulse, and                            oxygen saturations were monitored continuously. The                            CF-HQ190L ([id number]) Olympus colonoscope was                            introduced through the anus and advanced to the                            terminal ileum. The colonoscopy was performed                            without difficulty. The patient tolerated the                            procedure well. The quality of the bowel  preparation was excellent. The terminal ileum,                            ileocecal valve, appendiceal orifice, and rectum                            were photographed. Scope In: 11:42:57 AM Scope Out: 12:01:49 PM Scope Withdrawal Time: 0 hours 16 minutes 14 seconds  Total Procedure Duration: 0 hours 18 minutes 52 seconds  Findings:      The digital rectal exam was normal.      The terminal ileum appeared normal.      A 5 mm polyp was found in the ascending colon. The polyp was sessile.       The polyp was removed with a cold snare. Resection and retrieval were       complete.      A 2 mm polyp was found in the ascending colon. The polyp was sessile.       The polyp was removed with a cold biopsy forceps. Resection and       retrieval were complete.      A 8 mm polyp was found in the descending colon. The polyp was sessile.       The polyp was removed with a cold snare. Resection and retrieval were       complete.      A 5 mm polyp was found in the  sigmoid colon. The polyp was sessile. The       polyp was removed with a cold snare. Resection and retrieval were       complete.      Normal mucosa was found in the entire colon. Biopsies for histology were       taken with a cold forceps from the right colon and left colon for       evaluation of microscopic colitis.      Multiple small and large-mouthed diverticula were found in the sigmoid       colon and descending colon.      Retroflexion could not be performed in the rectum due to narrow rectal       vault. Multiple views of the distal rectum, dentate line obtained in       forward view. Impression:               - The examined portion of the ileum was normal.                           - One 5 mm polyp in the ascending colon, removed                            with a cold snare. Resected and retrieved.                           - One 2 mm polyp in the ascending colon, removed                            with a cold biopsy forceps. Resected and retrieved.                           -  One 8 mm polyp in the descending colon, removed                            with a cold snare. Resected and retrieved.                           - One 5 mm polyp in the sigmoid colon, removed with                            a cold snare. Resected and retrieved.                           - Normal mucosa in the entire examined colon.                            Biopsied.                           - Diverticulosis in the sigmoid colon and in the                            descending colon. Moderate Sedation:      N/A Recommendation:           - Patient has a contact number available for                            emergencies. The signs and symptoms of potential                            delayed complications were discussed with the                            patient. Return to normal activities tomorrow.                            Written discharge instructions were provided to the                             patient.                           - Resume previous diet.                           - Continue present medications.                           - Resume Xarelto (rivaroxaban) at prior dose                            tomorrow. Refer to managing physician for further                            adjustment of therapy.                           -  Await pathology results.                           - Repeat colonoscopy is recommended. The                            colonoscopy date will be determined after pathology                            results from today's exam become available for                            review. Procedure Code(s):        --- Professional ---                           [phone], Colonoscopy, flexible; with removal of                            tumor(s), polyp(s), or other lesion(s) by snare                            technique                           45380, 77, Colonoscopy, flexible; with biopsy,                            single or multiple Diagnosis Code(s):        --- Professional ---                           Z80.0, Family history of malignant neoplasm of                            digestive organs                           K63.5, Polyp of colon                           K57.30, Diverticulosis of large intestine without                            perforation or abscess without bleeding CPT copyright 2019 American [employer]. All rights reserved. The codes documented in this report are preliminary and upon coder review may  be revised to meet current compliance requirements. Jerene Bears, MD 11/02/2019 12:07:51 PM This report has been signed electronically. Number of Addenda: 0

## 2019-11-02 NOTE — H&P (Signed)
HPI: [name] is a 59 year old female with a complex past medical history including but not limited to chronic diarrhea, family history of colon cancer in her brother at age 29, COPD on home oxygen, prior DVT on Xarelto, sarcoidosis, CAD, hypertension, fibromyalgia who presents for outpatient colonoscopy.  She was seen by Tye Savoy, NP on 08/19/2019 to evaluate chronic diarrhea.  Infectious panel was negative though fecal lactoferrin was positive.  Her diarrhea has continued and is worse postprandially.  No new symptoms or complaints today.  Never had screening colonoscopy though this was scheduled previously but canceled due to lack of transportation.  Past Medical History:  Diagnosis Date  . Allergic rhinitis   . ALLERGIC RHINITIS 10/26/2009  . Anxiety   . ANXIETY 08/14/2006  . B12 DEFICIENCY 08/25/2007  . Complication of anesthesia    pt has had difficulty following anesthesia with her knee in 2016-unable to care for herself afterward  . Confusion   . Depression    takes Lexapro daily  . Diabetes mellitus without complication ([place])    was on insulin but has been off since Nov 2015 and now only takes Metformin daily  . DYSPNEA 04/28/2009   with exertion  . Esophageal reflux    takes Nexium daily  . Fibromyalgia   . Headache    last migraine 2-50yrs ago;takes Topamax daily  . History of shingles   . Hypertension    takes Coreg daily  . Insomnia    takes Nortriptyline nightly   . Joint pain   . Joint swelling   . Left knee pain   . Lichen planus   . Long-term memory loss   . Nocturia   . OSA (obstructive sleep apnea)    doesn't use CPAP;sleep study in epic from 2006  . Osteoarthritis   . Osteoarthritis   . Pneumonia    over 30 yrs ago  . Protein calorie malnutrition ([place])   . Rheumatoid arthritis ([place])   . Sarcoidosis    Dr. Lolita Patella  . Short-term memory loss   . Shortness of breath   . Sleep apnea     wears oxygen  . TIA (transient ischemic attack)    . Unsteady gait   . Urinary urgency   . Vitamin D deficiency    is supposed to take Vit D but can't afford it  . VITAMIN D DEFICIENCY 08/25/2007    Past Surgical History:  Procedure Laterality Date  . APPENDECTOMY    . arthroscopic knee surgery Right 11-12-04  . AXILLARY ABCESS IRRIGATION AND DEBRIDEMENT  Jul & Aug2012  . CARPAL TUNNEL RELEASE Left 05/23/2014   Procedure: CARPAL TUNNEL RELEASE;  Surgeon: Meredith Pel, MD;  Location: [place];  Service: Orthopedics;  Laterality: Left;  . CHOLECYSTECTOMY N/A 02/11/2018   Procedure: LAPAROSCOPIC CHOLECYSTECTOMY WITH INTRAOPERATIVE CHOLANGIOGRAM ERAS PATHWAY;  Surgeon: Donnie Mesa, MD;  Location: [place];  Service: General;  Laterality: N/A;  . cyst removed from top of buttocks  at age 70  . ENDOMETRIAL ABLATION    . IR CHOLANGIOGRAM EXISTING TUBE  11/21/2017  . IR CHOLANGIOGRAM EXISTING TUBE  01/16/2018  . IR EXCHANGE BILIARY DRAIN  11/10/2017  . IR PATIENT EVAL TECH 0-60 MINS  02/03/2018  . IR PERC CHOLECYSTOSTOMY  09/13/2017  . IR RADIOLOGIST EVAL & MGMT  10/08/2017  . LACRIMAL DUCT EXPLORATION Right 06/26/2017   Procedure: LACRIMAL DUCT EXPLORATION AND ETHMOIDECTOMY;  Surgeon: Clista Bernhardt, MD;  Location: [place];  Service: Ophthalmology;  Laterality: Right;  .  TEAR DUCT PROBING Right 06/26/2017   Procedure: TEAR DUCT PROBING WITH STENT;  Surgeon: Clista Bernhardt, MD;  Location: [place];  Service: Ophthalmology;  Laterality: Right;  . TOTAL KNEE ARTHROPLASTY Right 11/15/2014   Procedure: TOTAL RIGHT KNEE ARTHROPLASTY;  Surgeon: Meredith Pel, MD;  Location: [place];  Service: Orthopedics;  Laterality: Right;  . TOTAL KNEE ARTHROPLASTY Left 07/13/2015   Procedure: LEFT TOTAL KNEE ARTHROPLASTY;  Surgeon: Meredith Pel, MD;  Location: [place];  Service: Orthopedics;  Laterality: Left;    (Not in an outpatient encounter)   Allergies  Allergen Reactions  . Belsomra [Suvorexant] Other (See Comments)    [place] out of bed while  asleep: "I'm waking up as I'm falling on the floor;" "Night terrors"  . Enalapril Maleate Cough  . Latex Hives  . Nickel Other (See Comments)    Blisters  Pt has a titanium right and left knee - nickel causes skin irritations that form into blisters and sores  . Other Other (See Comments)    Patient has Sarcoidosis and can't tolerate any metals  . Morphine And Related Itching and Nausea Only  . Doxycycline Diarrhea and Nausea And Vomiting  . Hydrochlorothiazide Other (See Comments)    Low potassium levels   . Hydroxychloroquine Sulfate Other (See Comments)    Vision changes   . Lyrica [Pregabalin] Other (See Comments)    "Made me feel high"  . Tape Other (See Comments)    Medical tape causes bruising    Family History  Problem Relation Age of Onset  . Heart disease Mother   . Kidney disease Mother        renal failure  . Cancer Father        leukemia  . Hypertension Other   . Coronary artery disease Other        female 1st degree relative <60  . Heart failure Other        congestive  . Coronary artery disease Other        Female 1st degree relative <50  . Breast cancer Other        1st degree relative <50 S  . Breast cancer Sister   . Colon cancer Brother 60  . Stroke Brother   . Heart attack Brother   . Esophageal cancer Neg Hx   . Stomach cancer Neg Hx   . Rectal cancer Neg Hx     Social History   Tobacco Use  . Smoking status: Former Smoker    Packs/day: 0.50    Years: 10.00    Pack years: 5.00    Types: Cigarettes  . Smokeless tobacco: Never Used  . Tobacco comment: quit smoking in 2004  Vaping Use  . Vaping Use: Never used  Substance Use Topics  . Alcohol use: Not Currently    Alcohol/week: 1.0 standard drink    Types: 1 Glasses of wine per week  . Drug use: No    ROS: As per history of present illness, otherwise negative  LMP 05/19/2003  BP (!) 191/86   Pulse 69   Temp 98.1 F (36.7 C) (Oral)   Resp 14   Ht 5\' 7"  (1.702 m)   Wt 124.3 kg    LMP 05/19/2003   SpO2 100%   BMI 42.91 kg/m   Gen: awake, alert, NAD HEENT: anicteric, op clear CV: RRR, no mrg Pulm: CTA b/l Abd: soft, obese, NT/ND, +BS throughout Ext: no c/c/e Neuro: nonfocal   RELEVANT LABS AND  IMAGING: CBC    Component Value Date/Time   WBC 6.2 04/07/2018 0921   RBC 4.10 04/07/2018 0921   HGB 11.5 (L) 04/07/2018 0921   HGB 13.4 12/06/2013 0930   HCT 35.3 (L) 04/07/2018 0921   HCT 43.0 12/06/2013 0930   PLT 234.0 04/07/2018 0921   PLT 216 12/06/2013 0930   MCV 86.3 04/07/2018 0921   MCV 92.1 12/06/2013 0930   MCH 29.4 02/12/2018 0320   MCHC 32.4 04/07/2018 0921   RDW 15.6 (H) 04/07/2018 0921   RDW 14.5 12/06/2013 0930   LYMPHSABS 2.4 04/07/2018 0921   LYMPHSABS 1.7 12/06/2013 0930   MONOABS 0.6 04/07/2018 0921   MONOABS 0.6 12/06/2013 0930   EOSABS 0.3 04/07/2018 0921   EOSABS 0.1 12/06/2013 0930   BASOSABS 0.1 04/07/2018 0921   BASOSABS 0.0 12/06/2013 0930    CMP     Component Value Date/Time   NA 143 08/27/2018 1540   NA 140 12/06/2013 0930   K 4.4 08/27/2018 1540   K 3.7 12/06/2013 0930   CL 108 (H) 08/27/2018 1540   CO2 23 08/27/2018 1540   CO2 24 12/06/2013 0930   GLUCOSE 87 08/27/2018 1540   GLUCOSE 132 (H) 04/07/2018 0921   GLUCOSE 110 12/06/2013 0930   BUN 18 08/27/2018 1540   BUN 11.0 12/06/2013 0930   CREATININE 0.90 08/27/2018 1540   CREATININE 1.1 12/06/2013 0930   CALCIUM 9.2 08/27/2018 1540   CALCIUM 9.4 12/06/2013 0930   PROT 7.0 08/27/2018 1540   PROT 6.6 12/06/2013 0930   ALBUMIN 3.9 08/27/2018 1540   ALBUMIN 3.0 (L) 12/06/2013 0930   AST 19 08/27/2018 1540   AST 29 12/06/2013 0930   ALT 23 08/27/2018 1540   ALT 22 12/06/2013 0930   ALKPHOS 96 08/27/2018 1540   ALKPHOS 63 12/06/2013 0930   BILITOT <0.2 08/27/2018 1540   BILITOT 0.34 12/06/2013 0930   GFRNONAA 71 08/27/2018 1540   GFRAA 82 08/27/2018 1540    ASSESSMENT/PLAN: 59 year old female with a complex past medical history including but not  limited to chronic diarrhea, family history of colon cancer in her brother at age 5, COPD on home oxygen, prior DVT on Xarelto, sarcoidosis, CAD, hypertension, fibromyalgia who presents for outpatient colonoscopy.  1. Family history of colon cancer/unexplained diarrhea --outpatient colonoscopy today with monitored anesthesia care.  The nature of the procedure, as well as the risks, benefits, and alternatives were carefully and thoroughly reviewed with the patient. Ample time for discussion and questions allowed. The patient understood, was satisfied, and agreed to proceed.

## 2019-11-03 ENCOUNTER — Encounter (HOSPITAL_COMMUNITY): Payer: Self-pay | Admitting: Internal Medicine

## 2019-11-03 LAB — SURGICAL PATHOLOGY

## 2019-11-03 NOTE — Anesthesia Postprocedure Evaluation (Signed)
Anesthesia Post Note  Patient: [name] [facility]  Procedure(s) Performed: COLONOSCOPY WITH PROPOFOL (N/A ) BIOPSY POLYPECTOMY     Patient location during evaluation: PACU Anesthesia Type: MAC Level of consciousness: awake and alert Pain management: pain level controlled Vital Signs Assessment: post-procedure vital signs reviewed and stable Respiratory status: spontaneous breathing Cardiovascular status: stable Anesthetic complications: no   No complications documented.  Last Vitals:  Vitals:   11/02/19 1220 11/02/19 1230  BP: (!) 119/91 (!) 142/80  Pulse: 62 61  Resp: 17 15  Temp:    SpO2: 100% 100%    Last Pain:  Vitals:   11/02/19 1220  TempSrc:   PainSc: 0-No pain                 Nolon Nations

## 2019-11-04 ENCOUNTER — Encounter: Payer: Self-pay | Admitting: Internal Medicine

## 2019-11-04 MED ORDER — DIPHENOXYLATE-ATROPINE 2.5-0.025 MG PO TABS: 1.0000 | ORAL_TABLET | Freq: Three times a day (TID) | ORAL | 3 refills | Status: DC | PRN

## 2019-11-09 ENCOUNTER — Ambulatory Visit: Payer: Medicare Other | Admitting: Internal Medicine

## 2019-11-09 MED FILL — HUMIRA PEN CITRATE FREE 40 MG/0.4 ML: SUBCUTANEOUS | 28 days supply | Qty: 4 | Fill #5

## 2019-11-09 MED FILL — HUMIRA PEN CITRATE FREE 40 MG/0.4 ML: 28 days supply | Qty: 4 | Fill #5 | Status: AC

## 2019-11-11 ENCOUNTER — Other Ambulatory Visit: Payer: Self-pay | Admitting: Internal Medicine

## 2019-11-11 MED ORDER — TRIAMCINOLONE ACETONIDE 0.1 % TOPICAL CREAM
Freq: Two times a day (BID) | TOPICAL | 5 refills | 0.00000 days | Status: CP
Start: 2019-11-11 — End: 2020-11-10

## 2019-11-11 NOTE — Unmapped (Signed)
Addended by: Tammi Sou on: 11/11/2019 11:50 AM     Modules accepted: Orders

## 2019-11-12 ENCOUNTER — Other Ambulatory Visit: Payer: Self-pay | Admitting: *Deleted

## 2019-11-12 NOTE — Patient Outreach (Signed)
[place] [facility]) Care Management  [place]  11/12/2019   [name] [facility] 02-21-1960 [id number]   RN Health Coach Quarterly Outreach   Outreach Attempt:  Unsuccessful telephone outreach to patient for quarterly follow up.  No answer.  Left voice message with return contact information.  Plan:  RN Health Coach will reschedule patient with her primary Yoe within the month of November if no return call back from patient.   [place] [phone] [name]

## 2019-11-15 ENCOUNTER — Other Ambulatory Visit: Payer: Self-pay

## 2019-11-15 ENCOUNTER — Encounter: Payer: Self-pay | Admitting: Internal Medicine

## 2019-11-15 ENCOUNTER — Ambulatory Visit (INDEPENDENT_AMBULATORY_CARE_PROVIDER_SITE_OTHER): Payer: Medicare Other | Admitting: Internal Medicine

## 2019-11-15 DIAGNOSIS — E538 Deficiency of other specified B group vitamins: Secondary | ICD-10-CM

## 2019-11-15 DIAGNOSIS — F410 Panic disorder [episodic paroxysmal anxiety] without agoraphobia: Secondary | ICD-10-CM

## 2019-11-15 DIAGNOSIS — N182 Chronic kidney disease, stage 2 (mild): Secondary | ICD-10-CM | POA: Diagnosis not present

## 2019-11-15 DIAGNOSIS — I10 Essential (primary) hypertension: Secondary | ICD-10-CM

## 2019-11-15 DIAGNOSIS — Z23 Encounter for immunization: Secondary | ICD-10-CM

## 2019-11-15 DIAGNOSIS — E559 Vitamin D deficiency, unspecified: Secondary | ICD-10-CM

## 2019-11-15 DIAGNOSIS — E1122 Type 2 diabetes mellitus with diabetic chronic kidney disease: Secondary | ICD-10-CM | POA: Diagnosis not present

## 2019-11-15 LAB — COMPREHENSIVE METABOLIC PANEL
ALT: 9 U/L (ref 0–35)
AST: 13 U/L (ref 0–37)
Albumin: 3.7 g/dL (ref 3.5–5.2)
Alkaline Phosphatase: 72 U/L (ref 39–117)
BUN: 14 mg/dL (ref 6–23)
CO2: 26 mEq/L (ref 19–32)
Calcium: 8.6 mg/dL (ref 8.4–10.5)
Chloride: 109 mEq/L (ref 96–112)
Creatinine, Ser: 0.86 mg/dL (ref 0.40–1.20)
GFR: 74.01 mL/min (ref >60.00)
Glucose, Bld: 108 mg/dL — ABNORMAL HIGH (ref 70–99)
Potassium: 4 mEq/L (ref 3.5–5.1)
Sodium: 142 mEq/L (ref 135–145)
Total Bilirubin: 0.3 mg/dL (ref 0.2–1.2)
Total Protein: 6.9 g/dL (ref 6.0–8.3)

## 2019-11-15 LAB — TSH: TSH: 3.02 u[IU]/mL (ref 0.35–4.50)

## 2019-11-15 LAB — HEMOGLOBIN A1C: Hgb A1c MFr Bld: 6 % (ref 4.6–6.5)

## 2019-11-15 MED ORDER — VITAMIN B-12 1000 MCG SL SUBL: 1.0000 | SUBLINGUAL_TABLET | Freq: Every day | SUBLINGUAL | 3 refills | Status: AC

## 2019-11-15 MED ORDER — CARVEDILOL 12.5 MG PO TABS
12.5000 mg | ORAL_TABLET | Freq: Two times a day (BID) | ORAL | 3 refills | Status: DC
Start: 2019-11-15 — End: 2020-11-01

## 2019-11-15 MED ORDER — CLONAZEPAM 0.5 MG PO TABS
ORAL_TABLET | ORAL | 5 refills | Status: DC
Start: 2019-11-15 — End: 2019-11-15

## 2019-11-15 MED ORDER — CLONAZEPAM 0.5 MG PO TABS
ORAL_TABLET | ORAL | 1 refills | Status: DC
Start: 2019-11-15 — End: 2020-02-16

## 2019-11-15 MED ORDER — VITAMIN D3 1.25 MG (50000 UT) PO CAPS
1.0000 | ORAL_CAPSULE | ORAL | 3 refills | Status: DC
Start: 2019-11-15 — End: 2021-03-19

## 2019-11-15 MED ORDER — DEXILANT 60 MG PO CPDR
DELAYED_RELEASE_CAPSULE | ORAL | 3 refills | Status: DC
Start: 2019-11-15 — End: 2021-02-09

## 2019-11-15 NOTE — Progress Notes (Signed)
Subjective:  Patient ID: [name], female    DOB: [birth year]  Age: 59 y.o. MRN: [id number]  CC: Follow-up (3 month F/U- Flu shot) and Medication Refill (Carvedilol, Dexilant, and Clonazepam)   HPI  [name] presents for HTN, sarcoid, anxiety, DM f/u  Outpatient Medications Prior to Visit  Medication Sig Dispense Refill  . albuterol (VENTOLIN HFA) 108 (90 Base) MCG/ACT inhaler Inhale 2 puffs into the lungs every 6 (six) hours as needed for wheezing or shortness of breath. 8.5 g 0  . B-D ULTRA-FINE 33 LANCETS MISC Use as instructed to test sugars once  daily 100 each 12  . budesonide-formoterol (SYMBICORT) 160-4.5 MCG/ACT inhaler Inhale 2 puffs into the lungs TWICE DAILY (Patient taking differently: Inhale 2 puffs into the lungs 2 (two) times daily. ) 10.2 g 2  . carvedilol (COREG) 12.5 MG tablet Take 1 tablet (12.5 mg total) by mouth 2 (two) times daily with a meal. Annual appt due in June must see provider for future refills 60 tablet 0  . clonazePAM (KLONOPIN) 0.5 MG tablet Take 1 TO 2 tablets (0.5-1 mg total) by mouth at bedtime as needed for anxiety. (Patient taking differently: Take 0.5 mg by mouth at bedtime. ) 60 tablet 3  . DEXILANT 60 MG capsule Take 1 capsule (60 mg total) by mouth daily. (Patient taking differently: Take 60 mg by mouth daily. ) 90 capsule 2  . diphenoxylate-atropine (LOMOTIL) 2.5-0.025 MG tablet Take 1-2 tablets by mouth 4 (four) times daily as needed for diarrhea or loose stools. 60 tablet 0  . diphenoxylate-atropine (LOMOTIL) 2.5-0.025 MG tablet Take 1 tablet by mouth 3 (three) times daily as needed for diarrhea or loose stools. 60 tablet 3  . furosemide (LASIX) 40 MG tablet TAKE 1 TABLET BY MOUTH EVERY MORNING FOR EDEMA 90 tablet 1  . gabapentin (NEURONTIN) 300 MG capsule Take 1 capsule (300 mg total) by mouth See admin instructions. Take 300 mg at night, may take a second 300 mg dose during the day as needed for pain 180 capsule 0   . glucose blood (ACCU-CHEK GUIDE) test strip Use to check blood sugar once a day. 100 each 12  . HUMIRA PEN 40 MG/0.4ML PNKT Inject 40 mg as directed every Wednesday.     . hydrOXYzine (ATARAX/VISTARIL) 25 MG tablet Take 1 tablet (25 mg total) by mouth every 8 (eight) hours as needed for itching. Follow=-up appt is due must see provider for future refills (Patient taking differently: Take 25 mg by mouth 2 (two) times daily. ) 60 tablet 1  . lidocaine (XYLOCAINE) 5 % ointment Apply 1 application topically as needed. For bunion/toe pain 35.44 g 0  . metFORMIN (GLUCOPHAGE-XR) 500 MG 24 hr tablet Take 2 tablets (1,000 mg total) by mouth at bedtime. 180 tablet 2  . naltrexone (DEPADE) 50 MG tablet Take 12.5 mg by mouth at bedtime.     . OXYGEN Inhale into the lungs. Takes 2 liters every day (24/7)    . potassium chloride (KLOR-CON) 10 MEQ tablet Take 1 tablet (10 mEq total) by mouth daily. Annual appt due in June must see provider for future refills 30 tablet 0  . SUPREP BOWEL PREP KIT 17.5-3.13-1.6 GM/177ML SOLN SMARTSIG:1 Kit(s) By Mouth Once 540 mL 0  . topiramate (TOPAMAX) 50 MG tablet Take 1 tablet (50 mg total) by mouth 2 (two) times daily. 180 tablet 3  . UNABLE TO FIND Use as instructed to check blood sugar once a day.    [name]  venlafaxine XR (EFFEXOR-XR) 75 MG 24 hr capsule Take 1 capsule (75 mg total) by mouth daily with breakfast. 30 capsule 11   No facility-administered medications prior to visit.    ROS: Review of Systems  Constitutional: Positive for fatigue. Negative for activity change, appetite change, chills and unexpected weight change.  HENT: Negative for congestion, mouth sores and sinus pressure.   Eyes: Negative for visual disturbance.  Respiratory: Negative for cough and chest tightness.   Gastrointestinal: Negative for abdominal pain and nausea.  Genitourinary: Negative for difficulty urinating, frequency and vaginal pain.  Musculoskeletal: Positive for arthralgias, back  pain and gait problem.  Skin: Negative for pallor and rash.  Neurological: Negative for dizziness, tremors, weakness, numbness and headaches.  Psychiatric/Behavioral: Negative for confusion, sleep disturbance and suicidal ideas. The patient is nervous/anxious.     Objective:  BP (!) 142/90 (BP Location: Left Arm)   Pulse 68   Temp 98.5 F (36.9 C) (Oral)   Wt 273 lb 12.8 oz (124.2 kg)   LMP 05/19/2003   SpO2 94% Comment: 2 [place]  BMI 42.88 kg/m   BP Readings from Last 3 Encounters:  11/15/19 (!) 142/90  11/02/19 (!) 142/80  08/30/19 122/78    Wt Readings from Last 3 Encounters:  11/15/19 273 lb 12.8 oz (124.2 kg)  11/02/19 274 lb (124.3 kg)  08/30/19 275 lb (124.7 kg)    Physical Exam Constitutional:      General: She is not in acute distress.    Appearance: She is well-developed. She is obese.  HENT:     Head: Normocephalic.     Right Ear: External ear normal.     Left Ear: External ear normal.     Nose: Nose normal.  Eyes:     General:        Right eye: No discharge.        Left eye: No discharge.     Conjunctiva/sclera: Conjunctivae normal.     Pupils: Pupils are equal, round, and reactive to light.  Neck:     Thyroid: No thyromegaly.     Vascular: No JVD.     Trachea: No tracheal deviation.  Cardiovascular:     Rate and Rhythm: Normal rate and regular rhythm.     Heart sounds: Normal heart sounds.  Pulmonary:     Effort: No respiratory distress.     Breath sounds: No stridor. No wheezing.  Abdominal:     General: Bowel sounds are normal. There is no distension.     Palpations: Abdomen is soft. There is no mass.     Tenderness: There is no abdominal tenderness. There is no guarding or rebound.  Musculoskeletal:        General: No tenderness.     Cervical back: Normal range of motion and neck supple.  Lymphadenopathy:     Cervical: No cervical adenopathy.  Skin:    Findings: No erythema or rash.  Neurological:     Mental Status: She is oriented to  person, place, and time.     Cranial Nerves: No cranial nerve deficit.     Motor: Weakness present. No abnormal muscle tone.     Coordination: Coordination abnormal.     Gait: Gait abnormal.     Deep Tendon Reflexes: Reflexes normal.  Psychiatric:        Behavior: Behavior normal.        Thought Content: Thought content normal.        Judgment: Judgment normal.    Gilford Rile  LS tender  Lab Results  Component Value Date   WBC 6.2 04/07/2018   HGB 11.5 (L) 04/07/2018   HCT 35.3 (L) 04/07/2018   PLT 234.0 04/07/2018   GLUCOSE 87 08/27/2018   CHOL 220 (H) 08/27/2018   TRIG 107 08/27/2018   HDL 88 08/27/2018   LDLDIRECT 107.0 06/30/2017   LDLCALC 111 (H) 08/27/2018   ALT 23 08/27/2018   AST 19 08/27/2018   NA 143 08/27/2018   K 4.4 08/27/2018   CL 108 (H) 08/27/2018   CREATININE 0.90 08/27/2018   BUN 18 08/27/2018   CO2 23 08/27/2018   TSH 2.31 08/23/2016   INR 1.00 02/11/2018   HGBA1C 6.2 (A) 05/06/2019   MICROALBUR <0.7 02/01/2015    No results found.  Assessment & Plan:   There are no diagnoses linked to this encounter.   No orders of the defined types were placed in this encounter.    Follow-up: No follow-ups on file.  Walker Kehr, MD

## 2019-11-15 NOTE — Assessment & Plan Note (Signed)
Labs

## 2019-11-15 NOTE — Addendum Note (Signed)
Addended by: Boris Lown B on: 11/15/2019 10:33 AM   Modules accepted: Orders

## 2019-11-15 NOTE — Assessment & Plan Note (Signed)
Re- start Vit B12 °

## 2019-11-15 NOTE — Assessment & Plan Note (Signed)
On Clonazepam prn  Potential benefits of a long term benzodiazepines  use as well as potential risks  and complications were explained to the patient and were aknowledged. 

## 2019-11-15 NOTE — Assessment & Plan Note (Signed)
BP Readings from Last 3 Encounters:  11/15/19 (!) 142/90  11/02/19 (!) 142/80  08/30/19 122/78

## 2019-11-15 NOTE — Assessment & Plan Note (Signed)
Re-start Vit D 

## 2019-11-19 ENCOUNTER — Other Ambulatory Visit: Payer: Self-pay

## 2019-11-19 ENCOUNTER — Ambulatory Visit: Payer: Medicare Other

## 2019-11-19 ENCOUNTER — Telehealth: Payer: Self-pay

## 2019-11-19 NOTE — Telephone Encounter (Signed)
Left several messages regarding AWV phone visit; no return call; appt cnx.

## 2019-11-24 ENCOUNTER — Ambulatory Visit: Payer: Medicare Other

## 2019-11-25 ENCOUNTER — Other Ambulatory Visit: Payer: Self-pay | Admitting: Internal Medicine

## 2019-11-26 ENCOUNTER — Other Ambulatory Visit: Payer: Self-pay | Admitting: *Deleted

## 2019-11-27 ENCOUNTER — Ambulatory Visit: Payer: Medicare Other | Attending: Internal Medicine

## 2019-11-27 DIAGNOSIS — Z23 Encounter for immunization: Secondary | ICD-10-CM

## 2019-11-27 NOTE — Progress Notes (Signed)
   Covid-19 Vaccination Clinic  Name:  [name]    MRN: [id number] DOB: 19-Jun-1960  11/27/2019  Ms. Much was observed post Covid-19 immunization for 15 minutes without incident. She was provided with Vaccine Information Sheet and instruction to access the V-Safe system.   [name] was instructed to call 911 with any severe reactions post vaccine: [name] Difficulty breathing  . Swelling of face and throat  . A fast heartbeat  . A bad rash all over body  . Dizziness and weakness   Immunizations Administered    Name Date Dose VIS Date Route   Pfizer COVID-19 Vaccine 11/27/2019 12:17 PM 0.3 mL 11/03/2019 Intramuscular   Manufacturer: [place]   Lot: [id number]   [place]: [id number]

## 2019-12-02 NOTE — Unmapped (Signed)
[facility] Refill Coordination Note    Specialty Medication(s) to be Shipped:   Inflammatory Disorders: Humira    Other medication(s) to be shipped: No additional medications requested for fill at this time     Carolyn Newton, DOB: 27-Mar-1960  Phone: [phone] (home)       All above HIPAA information was verified with patient.     Was a Nurse, learning disability used for this call? No    Completed refill call assessment today to schedule patient's medication shipment from the [facility] [phone]).       Specialty medication(s) and dose(s) confirmed: Regimen is correct and unchanged.   Changes to medications: Carolyn Newton reports no changes at this time.  Changes to insurance: No  Questions for the pharmacist: No    Confirmed patient received Welcome Packet with first shipment. The patient will receive a drug information handout for each medication shipped and additional FDA Medication Guides as required.       DISEASE/MEDICATION-SPECIFIC INFORMATION        For patients on injectable medications: Patient currently has 1 doses left.  Next injection is scheduled for 12/08/2019.    SPECIALTY MEDICATION ADHERENCE     Medication Adherence    Patient reported X missed doses in the last month: 0  Specialty Medication: Humira CF 40 mg/0.4 ml  Patient is on additional specialty medications: No  Any gaps in refill history greater than 2 weeks in the last 3 months: no  Demonstrates understanding of importance of adherence: yes  Informant: patient  Reliability of informant: reliable  Confirmed plan for next specialty medication refill: delivery by pharmacy  Refills needed for supportive medications: not needed                      SHIPPING     Shipping address confirmed in Epic.     Delivery Scheduled: Yes, Expected medication delivery date: 12/07/2019.     Medication will be delivered via UPS to the prescription address in Epic WAM.    Carolyn Newton D Lei Dower   [facility] Specialty Technician

## 2019-12-03 ENCOUNTER — Other Ambulatory Visit: Payer: Self-pay | Admitting: Internal Medicine

## 2019-12-06 MED FILL — HUMIRA PEN CITRATE FREE 40 MG/0.4 ML: SUBCUTANEOUS | 28 days supply | Qty: 4 | Fill #6

## 2019-12-06 MED FILL — HUMIRA PEN CITRATE FREE 40 MG/0.4 ML: 28 days supply | Qty: 4 | Fill #6 | Status: AC

## 2019-12-16 ENCOUNTER — Other Ambulatory Visit: Payer: Self-pay | Admitting: Adult Health

## 2019-12-16 NOTE — Patient Outreach (Signed)
[place] [facility]) Care Management  [facility] Care Manager  [id number] Late entry  [place] Feb 18, 1960 [phone]  [place] telephone call to patient.  [employer] compliance verified. Per patient she is using her oxygen continuously at 2 liters. Her oxygen saturation is 94%. She has not had any COPD exacerbations since last outreach. She has received her flu shot and COVID booster . Patient stated she does have some back pain and gait problems. She uses a cane or walker to ambulate. She has not been exercising. She stated she tires easily with minimal exertion.  Patient has agreed to further outreach calls.   Encounter Medications:  Outpatient Encounter Medications as of 11/26/2019  Medication Sig Note  . clonazePAM (KLONOPIN) 0.5 MG tablet Take 1 TO 2 tablets (0.5-1 mg total) by mouth at bedtime as needed for anxiety.   . Cyanocobalamin (VITAMIN B-12) 1000 MCG SUBL Place 1 tablet (1,000 mcg total) under the tongue daily.   [name] dexlansoprazole (DEXILANT) 60 MG capsule Take 1 capsule (60 mg total) by mouth daily.   . diphenoxylate-atropine (LOMOTIL) 2.5-0.025 MG tablet Take 1-2 tablets by mouth 4 (four) times daily as needed for diarrhea or loose stools.   . diphenoxylate-atropine (LOMOTIL) 2.5-0.025 MG tablet Take 1 tablet by mouth 3 (three) times daily as needed for diarrhea or loose stools.   . furosemide (LASIX) 40 MG tablet TAKE 1 TABLET BY MOUTH EVERY MORNING FOR EDEMA   . gabapentin (NEURONTIN) 300 MG capsule Take 1 capsule (300 mg total) by mouth See admin instructions. Take 300 mg at night, may take a second 300 mg dose during the day as needed for pain   . glucose blood (ACCU-CHEK GUIDE) test strip Use to check blood sugar once a day.   [name] HUMIRA PEN 40 MG/0.4ML PNKT Inject 40 mg as directed every Wednesday.    . hydrOXYzine (ATARAX/VISTARIL) 25 MG tablet Take 1 tablet (25 mg total) by mouth every 8 (eight) hours as needed.   . metFORMIN (GLUCOPHAGE-XR) 500 MG  24 hr tablet Take 2 tablets (1,000 mg total) by mouth at bedtime.   . naltrexone (DEPADE) 50 MG tablet Take 12.5 mg by mouth at bedtime.    . OXYGEN Inhale into the lungs. Takes 2 liters every day (24/7)   . topiramate (TOPAMAX) 50 MG tablet Take 1 tablet (50 mg total) by mouth 2 (two) times daily.   [name] venlafaxine XR (EFFEXOR-XR) 75 MG 24 hr capsule Take 1 capsule (75 mg total) by mouth daily with breakfast.   . [DISCONTINUED] potassium chloride (KLOR-CON) 10 MEQ tablet Take 1 tablet (10 mEq total) by mouth daily. Annual appt due in June must see provider for future refills   . albuterol (VENTOLIN HFA) 108 (90 Base) MCG/ACT inhaler Inhale 2 puffs into the lungs every 6 (six) hours as needed for wheezing or shortness of breath.   . B-D ULTRA-FINE 33 LANCETS MISC Use as instructed to test sugars once  daily   . budesonide-formoterol (SYMBICORT) 160-4.5 MCG/ACT inhaler Inhale 2 puffs into the lungs TWICE DAILY (Patient taking differently: Inhale 2 puffs into the lungs 2 (two) times daily. )   . carvedilol (COREG) 12.5 MG tablet Take 1 tablet (12.5 mg total) by mouth 2 (two) times daily with a meal.   . Cholecalciferol (VITAMIN D3) 1.25 MG (50000 UT) CAPS Take 1 capsule by mouth every 30 (thirty) days.   [name] lidocaine (XYLOCAINE) 5 % ointment Apply 1 application topically as needed. For bunion/toe pain (Patient  not taking: Reported on 11/26/2019) 10/25/2019: Hasnt started  . SUPREP BOWEL PREP KIT 17.5-3.13-1.6 GM/177ML SOLN SMARTSIG:1 Kit(s) By Mouth Once (Patient not taking: Reported on 11/26/2019)   . UNABLE TO FIND Use as instructed to check blood sugar once a day.   . [DISCONTINUED] clonazePAM (KLONOPIN) 0.5 MG tablet Take 1 TO 2 tablets (0.5-1 mg total) by mouth at bedtime as needed for anxiety. (Patient taking differently: Take 0.5 mg by mouth at bedtime. )    No facility-administered encounter medications on file as of 11/26/2019.    Functional Status:  In your present state of health, do you  have any difficulty performing the following activities: 02/02/2019  Hearing? N  Vision? N  Difficulty concentrating or making decisions? N  Walking or climbing stairs? Y  Comment Patient uses a walker and is on continously O2  Dressing or bathing? Y  Comment patient has a caregiver  Doing errands, shopping? Y  Comment caregiver and family handles all shopping  Preparing Food and eating ? Y  Using the Toilet? N  In the past six months, have you accidently leaked urine? N  Do you have problems with loss of bowel control? N  Managing your Medications? N  Managing your Finances? N  Housekeeping or managing your Housekeeping? Y  Comment Patient has a caregiver  Some recent data might be hidden    Fall/Depression Screening: Fall Risk  11/26/2019 08/12/2019 05/04/2019  Falls in the past year? 0 1 1  Comment - - -  Number falls in past yr: 0 1 1  Injury with Fall? 0 0 0  Risk Factor Category  - - -  Risk for fall due to : Impaired balance/gait;Impaired mobility History of fall(s);Impaired balance/gait;Impaired mobility History of fall(s);Impaired balance/gait;Impaired mobility  Follow up Falls evaluation completed Falls evaluation completed Falls evaluation completed;Falls prevention discussed   PHQ 2/9 Scores 02/02/2019 04/29/2018 04/29/2018 11/07/2017 10/06/2017 10/02/2017 02/17/2017  PHQ - 2 Score 4 4 4 3 0 0 2  PHQ- 9 Score 19 19 19 8 - - 7    Assessment:  Goals Addressed            This Visit's Progress   . (THN)Eat Healthy       Follow Up Date 02282022   - set goal weight - drink 6 to 8 glasses of water each day - manage portion size - set a realistic goal - switch to sugar-free drinks    Why is this important?   When you are ready to manage your nutrition or weight, having a plan and setting goals will help.  Taking small steps to change how you eat and exercise is a good place to start.    Notes:  Patient calls out to order food. Need to work on making healthy  choices    . (THN)Learn and Do Breathing Exercises       Follow Up Date 02282022   - do exercises in a comfortable position that makes breathing as easy as possible    Why is this important?    Breathing exercises can help lessen the cough that comes with chronic obstructive pulmonary disease.   Doing the exercises will give you more energy.   They will also help you to control your symptoms.    Notes:     . (THN)Manage Fatigue (Tiredness)       Follow Up Date 02282022   - eat healthy - use devices that will help like a cane, sock-puller or reacher      Why is this important?    Feeling tired or worn out is a common symptom of COPD (chronic obstructive pulmonary disease).   Learning when you feel your best and when you need rest is important.   Managing the tiredness (fatigue) will help you be active and enjoy life.     Notes:  Patient takes frequent times yo rest. She is on O2 continuously and gets short of breath with min exertion    . (THN)Track and Manage My Symptoms       Follow Up Date 02282022   - develop a rescue plan - eliminate symptom triggers at home - follow rescue plan if symptoms flare-up - keep follow-up appointments    Why is this important?    Tracking your symptoms and other information about your health helps your doctor plan your care.   Write down the symptoms, the time of day, what you were doing and what medicine you are taking.   You will soon learn how to manage your symptoms.     Notes:     . (THN)Track and Manage My Triggers       Follow Up Date 02282022  - avoid second hand smoke - limit outdoor activity during cold weather - listen for public air quality announcements every day    Why is this important?    Triggers are activities or things, like tobacco smoke or cold weather, that make your COPD (chronic obstructive pulmonary disease) flare-up.   Knowing these triggers helps you plan how to stay away from them.   When you  cannot remove them, you can learn how to manage them.     Notes:        Plan:  Patient will keep follow up appointments Patient will use inhalers as per ordered Patient will practice oxygen safety RN will follow up outreach within the month of February RN sent update assessment to PCP    BSN RN [facility] Care Management [phone]    

## 2019-12-16 NOTE — Patient Instructions (Signed)
Goals Addressed            This Visit's Progress    (THN)Eat Healthy       Follow Up Date [id number]   - set goal weight - drink 6 to 8 glasses of water each day - manage portion size - set a realistic goal - switch to sugar-free drinks    Why is this important?   When you are ready to manage your nutrition or weight, having a plan and setting goals will help.  Taking small steps to change how you eat and exercise is a good place to start.    Notes:  Patient calls out to order food. Need to work on making healthy choices     (THN)Learn and Do Breathing Exercises       Follow Up Date [id number]   - do exercises in a comfortable position that makes breathing as easy as possible    Why is this important?    Breathing exercises can help lessen the cough that comes with chronic obstructive pulmonary disease.   Doing the exercises will give you more energy.   They will also help you to control your symptoms.    Notes:      (THN)Manage Fatigue (Tiredness)       Follow Up Date [id number]   - eat healthy - use devices that will help like a cane, sock-puller or reacher    Why is this important?    Feeling tired or worn out is a common symptom of COPD (chronic obstructive pulmonary disease).   Learning when you feel your best and when you need rest is important.   Managing the tiredness (fatigue) will help you be active and enjoy life.     Notes:  Patient takes frequent times yo rest. She is on O2 continuously and gets short of breath with min exertion     (THN)Track and Manage My Symptoms       Follow Up Date [id number]   - develop a rescue plan - eliminate symptom triggers at home - follow rescue plan if symptoms flare-up - keep follow-up appointments    Why is this important?    Tracking your symptoms and other information about your health helps your doctor plan your care.   Write down the symptoms, the time of day, what you were doing and what medicine you are  taking.   You will soon learn how to manage your symptoms.     Notes:      (THN)Track and Manage My Triggers       Follow Up Date [id number]  - avoid second hand smoke - limit outdoor activity during cold weather - listen for public air quality announcements every day    Why is this important?    Triggers are activities or things, like tobacco smoke or cold weather, that make your COPD (chronic obstructive pulmonary disease) flare-up.   Knowing these triggers helps you plan how to stay away from them.   When you cannot remove them, you can learn how to manage them.     Notes:

## 2019-12-30 NOTE — Unmapped (Signed)
[facility] Refill Coordination Note    Specialty Medication(s) to be Shipped:   Inflammatory Disorders: Humira    Other medication(s) to be shipped: No additional medications requested for fill at this time     Carolyn Newton, DOB: 09-Jun-1960  Phone: [phone] (home)       All above HIPAA information was verified with patient.     Was a Nurse, learning disability used for this call? No    Completed refill call assessment today to schedule patient's medication shipment from the [facility] [phone]).       Specialty medication(s) and dose(s) confirmed: Regimen is correct and unchanged.   Changes to medications: Carolyn Newton reports no changes at this time.  Changes to insurance: No  Questions for the pharmacist: No    Confirmed patient received Welcome Packet with first shipment. The patient will receive a drug information handout for each medication shipped and additional FDA Medication Guides as required.       DISEASE/MEDICATION-SPECIFIC INFORMATION        For patients on injectable medications: Patient currently has 0 doses left.  Next injection is scheduled for 01/05/2020.    SPECIALTY MEDICATION ADHERENCE     Medication Adherence    Patient reported X missed doses in the last month: 0  Specialty Medication: Humira CF 40 mg/0.4 ml  Patient is on additional specialty medications: No         SHIPPING     Shipping address confirmed in Epic.     Delivery Scheduled: Yes, Expected medication delivery date: 01/04/2020.     Medication will be delivered via UPS to the prescription address in Epic WAM.    Carolyn Newton [facility] Specialty Technician

## 2020-01-03 MED FILL — HUMIRA PEN CITRATE FREE 40 MG/0.4 ML: 28 days supply | Qty: 4 | Fill #7 | Status: AC

## 2020-01-03 MED FILL — HUMIRA PEN CITRATE FREE 40 MG/0.4 ML: SUBCUTANEOUS | 28 days supply | Qty: 4 | Fill #7

## 2020-01-17 ENCOUNTER — Ambulatory Visit: Payer: Medicare Other | Admitting: Sports Medicine

## 2020-01-20 ENCOUNTER — Ambulatory Visit (INDEPENDENT_AMBULATORY_CARE_PROVIDER_SITE_OTHER): Payer: Medicare Other | Admitting: Sports Medicine

## 2020-01-20 ENCOUNTER — Other Ambulatory Visit: Payer: Self-pay

## 2020-01-20 ENCOUNTER — Encounter: Payer: Self-pay | Admitting: Sports Medicine

## 2020-01-20 DIAGNOSIS — M79675 Pain in left toe(s): Secondary | ICD-10-CM | POA: Diagnosis not present

## 2020-01-20 DIAGNOSIS — B351 Tinea unguium: Secondary | ICD-10-CM | POA: Diagnosis not present

## 2020-01-20 DIAGNOSIS — M79674 Pain in right toe(s): Secondary | ICD-10-CM | POA: Diagnosis not present

## 2020-01-20 DIAGNOSIS — L439 Lichen planus, unspecified: Secondary | ICD-10-CM

## 2020-01-20 DIAGNOSIS — E1142 Type 2 diabetes mellitus with diabetic polyneuropathy: Secondary | ICD-10-CM

## 2020-01-20 NOTE — Progress Notes (Signed)
Subjective: [name] is a 60 y.o. female patient with history of diabetes who presents to office today complaining of long,mildly painful nails while ambulating in shoes; unable to trim. No other issues noted.   FBS 157  Last PCP visit 2 months ago  Patient Active Problem List   Diagnosis Date Noted  . Benign neoplasm of ascending colon   . Benign neoplasm of descending colon   . Benign neoplasm of sigmoid colon   . Chronic diarrhea   . Diarrhea 07/06/2019  . Shoulder pain, bilateral 02/01/2019  . Physical deconditioning 01/28/2019  . Chronic anticoagulation 11/19/2018  . Insulin dependent diabetes mellitus type IA ([place]) 11/19/2018  . Oxygen dependent 11/19/2018  . Family history of colon cancer 10/26/2018  . Myringotomy tube status 08/21/2018  . Vaginal candidiasis 07/15/2018  . Nasal crusting 06/12/2018  . Chronic serous otitis media of left ear 06/04/2018  . Mixed conductive and sensorineural hearing loss of left ear with restricted hearing of right ear 06/04/2018  . Chronic cholecystitis with calculus 02/11/2018  . Thrush, oral 01/15/2018  . Nasal sinus congestion 01/15/2018  . Dyslipidemia 10/28/2017  . Pruritus 10/23/2017  . Abnormal liver function test   . Anemia 09/24/2017  . Migraines 09/17/2017  . Cholecystitis, acute 09/12/2017  . COPD (chronic obstructive pulmonary disease) ([place]) 12/30/2016  . Diastolic dysfunction 29/93/7169  . OSA (obstructive sleep apnea) 09/30/2016  . Chronic respiratory failure with hypoxia ([place]) 08/23/2016  . Pulmonary embolism ([place]) 07/21/2016  . DVT (deep venous thrombosis) ([place]) 07/21/2016  . Weight gain 12/20/2015  . Controlled type 2 diabetes mellitus with chronic kidney disease, without long-term current use of insulin ([place]) 02/01/2015  . Abnormal SPEP 06/04/2013  . Memory loss 04/27/2013  . Panic attacks 03/17/2013  . Rash 10/27/2012  . Pulmonary nodules 07/20/2012  . CAD in native artery 05/28/2012  .  Seborrheic dermatitis 01/20/2012  . Obesity, Class III, BMI 40-49.9 (morbid obesity) ([place]) 03/07/2010  . INSOMNIA, CHRONIC 03/07/2010  . Allergic rhinitis 10/26/2009  . Essential hypertension 08/27/2007  . B12 deficiency 08/25/2007  . Vitamin D deficiency 08/25/2007  . Urinary tract infection 02/07/2007  . SINUSITIS, ACUTE 02/02/2007  . Osteoarthritis 02/02/2007  . Sarcoidosis 08/14/2006  . Anxiety state 08/14/2006  . Major depressive disorder, recurrent episode ([place]) 08/14/2006  . GERD 08/14/2006   Current Outpatient Medications on File Prior to Visit  Medication Sig Dispense Refill  . albuterol (VENTOLIN HFA) 108 (90 Base) MCG/ACT inhaler Inhale 2 puffs into the lungs every 6 (six) hours as needed for wheezing or shortness of breath. 8.5 g 0  . B-D ULTRA-FINE 33 LANCETS MISC Use as instructed to test sugars once  daily 100 each 12  . budesonide-formoterol (SYMBICORT) 160-4.5 MCG/ACT inhaler Inhale 2 puffs into the lungs TWICE DAILY 10.2 g 5  . carvedilol (COREG) 12.5 MG tablet Take 1 tablet (12.5 mg total) by mouth 2 (two) times daily with a meal. 180 tablet 3  . Cholecalciferol (VITAMIN D3) 1.25 MG (50000 UT) CAPS Take 1 capsule by mouth every 30 (thirty) days. 3 capsule 3  . clonazePAM (KLONOPIN) 0.5 MG tablet Take 1 TO 2 tablets (0.5-1 mg total) by mouth at bedtime as needed for anxiety. 180 tablet 1  . Cyanocobalamin (VITAMIN B-12) 1000 MCG SUBL Place 1 tablet (1,000 mcg total) under the tongue daily. 100 tablet 3  . dexlansoprazole (DEXILANT) 60 MG capsule Take 1 capsule (60 mg total) by mouth daily. 90 capsule 3  . diphenoxylate-atropine (LOMOTIL) 2.5-0.025 MG tablet  Take 1-2 tablets by mouth 4 (four) times daily as needed for diarrhea or loose stools. 60 tablet 0  . diphenoxylate-atropine (LOMOTIL) 2.5-0.025 MG tablet Take 1 tablet by mouth 3 (three) times daily as needed for diarrhea or loose stools. 60 tablet 3  . furosemide (LASIX) 40 MG tablet TAKE 1 TABLET BY MOUTH EVERY  MORNING FOR EDEMA 90 tablet 1  . gabapentin (NEURONTIN) 300 MG capsule Take 1 capsule (300 mg total) by mouth See admin instructions. Take 300 mg at night, may take a second 300 mg dose during the day as needed for pain 180 capsule 0  . glucose blood (ACCU-CHEK GUIDE) test strip Use to check blood sugar once a day. 100 each 12  . HUMIRA PEN 40 MG/0.4ML PNKT Inject 40 mg as directed every Wednesday.     . hydrOXYzine (ATARAX/VISTARIL) 25 MG tablet Take 1 tablet (25 mg total) by mouth every 8 (eight) hours as needed. 60 tablet 1  . lidocaine (XYLOCAINE) 5 % ointment Apply 1 application topically as needed. For bunion/toe pain (Patient not taking: Reported on 11/26/2019) 35.44 g 0  . metFORMIN (GLUCOPHAGE-XR) 500 MG 24 hr tablet Take 2 tablets (1,000 mg total) by mouth at bedtime. 180 tablet 2  . naltrexone (DEPADE) 50 MG tablet Take 12.5 mg by mouth at bedtime.     . OXYGEN Inhale into the lungs. Takes 2 liters every day (24/7)    . potassium chloride (KLOR-CON) 10 MEQ tablet Take 1 tablet (10 mEq total) by mouth daily. 90 tablet 1  . SUPREP BOWEL PREP KIT 17.5-3.13-1.6 GM/177ML SOLN SMARTSIG:1 Kit(s) By Mouth Once (Patient not taking: Reported on 11/26/2019) 540 mL 0  . topiramate (TOPAMAX) 50 MG tablet Take 1 tablet (50 mg total) by mouth 2 (two) times daily. 180 tablet 3  . UNABLE TO FIND Use as instructed to check blood sugar once a day.    . venlafaxine XR (EFFEXOR-XR) 75 MG 24 hr capsule Take 1 capsule (75 mg total) by mouth daily with breakfast. 30 capsule 11   No current facility-administered medications on file prior to visit.   Allergies  Allergen Reactions  . Belsomra [Suvorexant] Other (See Comments)    [place] out of bed while asleep: "I'm waking up as I'm falling on the floor;" "Night terrors"  . Enalapril Maleate Cough  . Latex Hives  . Nickel Other (See Comments)    Blisters  Pt has a titanium right and left knee - nickel causes skin irritations that form into blisters and sores   . Other Other (See Comments)    Patient has Sarcoidosis and can't tolerate any metals  . Morphine And Related Itching and Nausea Only  . Doxycycline Diarrhea and Nausea And Vomiting  . Hydrochlorothiazide Other (See Comments)    Low potassium levels   . Hydroxychloroquine Sulfate Other (See Comments)    Vision changes   . Lyrica [Pregabalin] Other (See Comments)    "Made me feel high"  . Tape Other (See Comments)    Medical tape causes bruising    Recent Results (from the past 2160 hour(s))  SARS CORONAVIRUS 2 (TAT 6-24 HRS) Nasopharyngeal Nasopharyngeal Swab     Status: None   Collection Time: 10/29/19 11:56 AM   Specimen: Nasopharyngeal Swab  Result Value Ref Range   SARS Coronavirus 2 NEGATIVE NEGATIVE    Comment: (NOTE) SARS-CoV-2 target nucleic acids are NOT DETECTED.  The SARS-CoV-2 RNA is generally detectable in upper and lower respiratory specimens during the acute phase  of infection. Negative results do not preclude SARS-CoV-2 infection, do not rule out co-infections with other pathogens, and should not be used as the sole basis for treatment or other patient management decisions. Negative results must be combined with clinical observations, patient history, and epidemiological information. The expected result is Negative.  Fact Sheet for Patients: SugarRoll.be  Fact Sheet for Healthcare Providers: https://www.woods-mathews.com/  This test is not yet approved or cleared by the [place] FDA and  has been authorized for detection and/or diagnosis of SARS-CoV-2 by FDA under an Emergency Use Authorization (EUA). This EUA will remain  in effect (meaning this test can be used) for the duration of the COVID-19 declaration under Se ction 564(b)(1) of the Act, 21 U.S.C. section 360bbb-3(b)(1), unless the authorization is terminated or revoked sooner.  Performed at [facility], [place] [address], [place],  [place] [address]   Glucose, capillary     Status: Abnormal   Collection Time: 11/02/19 11:22 AM  Result Value Ref Range   Glucose-Capillary 109 (H) 70 - 99 mg/dL    Comment: Glucose reference range applies only to samples taken after fasting for at least 8 hours.  Surgical pathology     Status: None   Collection Time: 11/02/19 11:48 AM  Result Value Ref Range   SURGICAL PATHOLOGY      SURGICAL PATHOLOGY CASE: [id number] PATIENT: Bary Leriche Surgical Pathology Report     Clinical History: Diarrhea; diverticulitis (crm)   FINAL MICROSCOPIC DIAGNOSIS:  A. COLON, ASCENDING, POLYPECTOMY: -  Tubular adenoma (3 of 7 fragments) -  Benign colonic mucosa (4 of 7 fragments) -  No high grade dysplasia or malignancy identified  B. COLON, RANDOM, BIOPSY: -  Benign colonic mucosa -  No active inflammation or evidence of microscopic colitis -  No high grade dysplasia or malignancy identified  C. COLON, DESCENDING, POLYPECTOMY: -  Tubular adenoma (1 of 2 fragments) -  Hyperplastic polyp (1 of 2 fragments) -  No high grade dysplasia or malignancy identified   GROSS DESCRIPTION:  A: Received in formalin are tan, soft tissue fragments that are submitted in toto. Number: 7 size: 0.2-0.7 cm blocks: 1  B: Received in formalin are tan, soft tissue fragments that are submitted in toto. Number: Multiple size: 0.2-0.6 cm blocks: 1  C: Received  in formalin are tan, soft tissue fragments that are submitted in toto. Number: 2 size: 0.5 x 1.0 cm blocks: 1 (GRP 11/02/2019)   Final Diagnosis performed by Thressa Sheller, MD.   Electronically signed 11/03/2019 Technical component performed at [facility], [place] [address] [place], [place] [address].  Professional component performed at [employer]. [facility], [place] [address] [place], [place] [address].  Immunohistochemistry Technical component (if applicable) was performed at [facility]. [address] [place], [place], [place] [address].   IMMUNOHISTOCHEMISTRY DISCLAIMER (if applicable): Some of these immunohistochemical stains may have been developed and the performance characteristics determine by [facility]. Some may not have been cleared or approved by the U.S. Food and Drug Administration. The FDA has determined that such clearance or approval is not necessary. This test is used for clinical purposes.  It should not be regarded as investigational or for research. This laboratory is certified under the [place] (CLIA-88) as qualified to perform high complexity clinical laboratory testing.  The controls stained appropriately.   TSH     Status: None   Collection Time: 11/15/19 10:34 AM  Result Value Ref Range   TSH 3.02 0.35 - 4.50 uIU/mL  Hemoglobin A1c     Status: None   Collection Time: 11/15/19 10:34 AM  Result Value Ref Range   Hgb A1c MFr Bld 6.0 4.6 - 6.5 %    Comment: Glycemic Control Guidelines for People with Diabetes:Non Diabetic:  <6%Goal of Therapy: <7%Additional Action Suggested:  >8%   Comprehensive metabolic panel     Status: Abnormal   Collection Time: 11/15/19 10:34 AM  Result Value Ref Range   Sodium 142 135 - 145 mEq/L   Potassium 4.0 3.5 - 5.1 mEq/L   Chloride 109 96 - 112 mEq/L   CO2 26 19 - 32 mEq/L   Glucose, Bld 108 (H) 70 - 99 mg/dL   BUN 14 6 - 23 mg/dL   Creatinine, Ser 0.86 0.40 - 1.20 mg/dL   Total Bilirubin 0.3 0.2 - 1.2 mg/dL   Alkaline Phosphatase 72 39 - 117 U/L   AST 13 0 - 37 U/L   ALT 9 0 - 35 U/L   Total Protein 6.9 6.0 - 8.3 g/dL   Albumin 3.7 3.5 - 5.2 g/dL   GFR 74.01 >60.00 mL/min    Comment: Calculated using the CKD-EPI Creatinine Equation (2021)   Calcium 8.6 8.4 - 10.5 mg/dL    Objective: General: Patient is awake, alert, and oriented x 3 and in no acute distress.  Integument: Skin is warm, dry and supple bilateral. Nails are tender, long, thickened and  dystrophic with subungual debris, consistent with onychomycosis, 1-5 bilateral. No signs of infection. No open lesions or preulcerative lesions present bilateral. Small hyperpigmented patches bilateral, hx of lichen planus unchanged. Remaining integument unremarkable.  Vasculature:  Dorsalis Pedis pulse 1/4 bilateral. Posterior Tibial pulse  0/4 bilateral. Capillary fill time <3 sec 1-5 bilateral. Positive hair growth to the level of the digits. Temperature gradient within normal limits. No varicosities present bilateral. Trace edema present bilateral.   Neurology: The patient has diminished sensation measured with a 5.07/10g Semmes Weinstein Monofilament at all pedal sites bilateral . Vibratory sensation diminished bilateral with tuning fork. No Babinski sign present bilateral.   Musculoskeletal: Asymptomatic pes planus pedal deformities noted bilateral. Chronic toe pain R>L and heel pain bilateral. Muscular strength 5/5 in all lower extremity muscular groups bilateral without pain on range of motion. No tenderness with calf compression bilateral.  Assessment and Plan: Problem List Items Addressed This Visit   None   Visit Diagnoses    Pain due to onychomycosis of toenails of both feet    -  Primary   Diabetic polyneuropathy associated with type 2 diabetes mellitus (HCC)       Lichen planus         -Examined patient. -Discussed the importance of daily inspection in the setting of diabetes -Mechanically debrided all nails 1-5 bilateral using sterile nail nipper and filed with dremel without incident  -Continue with diabetic shoes  -Answered all patient questions -Patient to return  in 3 months for at risk foot care -Patient advised to call the office if any problems or questions arise in the meantime.  Landis Martins, DPM

## 2020-01-25 NOTE — Unmapped (Signed)
Carolyn Newton reports things are going well with her Humira and she reports her sarcoid is stable.     She hasn't seen pulmonary provider in a while - I encouraged her to call to make sure PFTs were up to date. She denies any changes with breathing/O2 requirement.    [facility] Clinical Assessment & Refill Coordination Note    Carolyn Newton, DOB: [birth year]  Phone: [phone] (home)     All above HIPAA information was verified with patient.     Was a Nurse, learning disability used for this call? No    Specialty Medication(s):   Inflammatory Disorders: Humira     Current Outpatient Medications   Medication Sig Dispense Refill   ??? ADALIMUMAB PEN CITRATE FREE 40 MG/0.4 ML Inject the contents of 1 syringe (40 mg total) under the skin every seven (7) days. 4 each 11   ??? albuterol (PROVENTIL HFA;VENTOLIN HFA) 90 mcg/actuation inhaler Inhale 2 puffs.     ??? amoxicillin-clavulanate (AUGMENTIN) 875-125 mg per tablet Take 1 tablet by mouth every twelve (12) hours.     ??? amoxicillin-clavulanate (AUGMENTIN) 875-125 mg per tablet Take 1 tablet by mouth 2 (two) times daily. 42 tablet 0   ??? blood sugar diagnostic (FREESTYLE LITE STRIPS) Strp TEST TWICE DAILY     ??? blood sugar diagnostic (ON CALL EXPRESS TEST STRIP) Strp Use as instructed to check blood sugar once a day. 100 each 12   ??? blood-glucose meter (FREESTYLE LITE METER) kit Use to check blood sugar 2 times per day     ??? blood-glucose meter (ON CALL EXPRESS METER) Misc Use to check blood sugar once a day 1 each 0   ??? carvedilol (COREG) 12.5 MG tablet TAKE 1 TABLET BY MOUTH TWICE DAILY WITH A MEAL     ??? chlorhexidine (PERIDEX) 0.12 % solution 5 mL.     ??? clobetasoL (TEMOVATE) 0.05 % cream Apply topically Two (2) times a day. 60 g 5   ??? clonazePAM (KLONOPIN) 0.5 MG tablet      ??? dexlansoprazole (DEXILANT) 60 mg capsule      ??? diclofenac sodium (VOLTAREN) 1 % gel Apply 2 g topically 2 (two) times daily as needed (for pain). 100/4=25     ??? docusate sodium (COLACE) 100 MG capsule Take 100 mg by mouth.     ??? empty container (SHARPS CONTAINER) Misc Use as directed to dispose of Humira needles 1 each 2   ??? empty container Misc USE AS DIRECTED 1 each 2   ??? escitalopram oxalate (LEXAPRO) 20 MG tablet TAKE 1 TABLET BY MOUTH DAILY     ??? esomeprazole (NEXIUM) 40 MG capsule Take 40 mg by mouth.     ??? fluticasone propionate (FLONASE) 50 mcg/actuation nasal spray 1 spray into each nostril.     ??? furosemide (LASIX) 20 MG tablet Take 20 mg by mouth.     ??? gabapentin (NEURONTIN) 300 MG capsule Take 1 capsule (300 mg total) by mouth 2 (two) times daily as needed (for pain).  1   ??? hydrOXYzine (ATARAX) 25 MG tablet Take 1/4 tablet in the morning and 1/2 tablet in the evenings as needed for itch 30 tablet 5   ??? lancets (FREESTYLE) 28 gauge Misc 1 each.     ??? lancets 30 gauge Misc Use as instructed to test sugars once daily 100 each 12   ??? levoFLOXacin (LEVAQUIN) 750 MG tablet Take 750 mg by mouth.     ??? metFORMIN (GLUCOPHAGE) 500 MG  tablet TAKE 2 TABLETS(1000 MG) BY MOUTH DAILY WITH FOOD     ??? moxifloxacin (AVELOX) 400 mg tablet Take 1 tablet (400 mg total) by mouth daily. 7 tablet 0   ??? naltrexone (DEPADE) 50 mg tablet Split tablets into quarters. Take one-quarter of a tablet once daily 30 tablet 11   ??? nortriptyline (PAMELOR) 75 MG capsule Take 75 mg by mouth.     ??? nystatin (MYCOSTATIN) 100,000 unit/mL suspension      ??? omeprazole (PRILOSEC) 20 MG capsule Take 20 mg by mouth.     ??? potassium chloride (KLOR-CON 10) 10 MEQ CR tablet TAKE 1 TABLET BY MOUTH DAILY     ??? rivaroxaban (XARELTO) 20 mg tablet TAKE 1 TABLET BY MOUTH EVERY DAY WITH SUPPER     ??? suvorexant (BELSOMRA) 20 mg tablet Take 20 mg by mouth.     ??? SYMBICORT 160-4.5 mcg/actuation inhaler Inhale 2 puffs into the lungs TWICE DAILY 120/4=30  5   ??? topiramate (TOPAMAX) 50 MG tablet TAKE 1 TABLET BY MOUTH TWICE DAILY     ??? triamcinolone (KENALOG) 0.1 % cream Apply topically Two (2) times a day. As needed for itching 454 g 5 ??? venlafaxine (EFFEXOR-XR) 75 MG 24 hr capsule Take 75 mg by mouth.       Current Facility-Administered Medications   Medication Dose Route Frequency Provider Last Rate Last Admin   ??? triamcinolone acetonide (KENALOG) injection 10 mg  10 mg Intradermal Once Elsie Stain, MD            Changes to medications: Carolyn Newton reports no changes at this time.    Allergies   Allergen Reactions   ??? Other Other (See Comments)     Patient has Sarcoidosis and can't tolerate any metals   ??? Suvorexant Other (See Comments)     [place] Se[place] out of bed while asleep: I'm waking up as I'm falling on the floor; Night terrors   ??? Enalapril Maleate      REACTION: cough   ??? Hydrochlorothiazide      REACTION: Low potassium   ??? Hydroxychloroquine Sulfate      REACTION: vision changes   ??? Iodine      Mother was intolerant--CODED on CT table---pt never tired.   ??? Latex      Hives   ??? Nickel Other (See Comments)     Pt has a titanium right and left knee - nickel causes skin irritations that forms into blisters and sores  Other reaction(s): Other (See Comments)  Pt has a titanium right knee - nickel causes skin irritations that forms into blisters and sores   ??? Adhesive Tape-Silicones Other (See Comments)     Medical tape causes bruising   ??? Doxycycline Diarrhea and Nausea And Vomiting   ??? Morphine Itching and Nausea Only   ??? Pregabalin Other (See Comments)     Made me feel high       Changes to allergies: No    SPECIALTY MEDICATION ADHERENCE     Humira - 2 pen (14 days)  Medication Adherence    Patient reported X missed doses in the last month: 0  Specialty Medication: Humira  Patient is on additional specialty medications: No          Specialty medication(s) dose(s) confirmed: Regimen is correct and unchanged.     Are there any concerns with adherence? No    Adherence counseling provided? Not needed    CLINICAL MANAGEMENT AND INTERVENTION  Clinical Benefit Assessment:    Do you feel the medicine is effective or helping your condition? Yes    Clinical Benefit counseling provided? Not needed    Adverse Effects Assessment:    Are you experiencing any side effects? No    Are you experiencing difficulty administering your medicine? No    Quality of Life Assessment:    How many days over the past month did your HS  keep you from your normal activities? For example, brushing your teeth or getting up in the morning. 0    Have you discussed this with your provider? Not needed    Therapy Appropriateness:    Is therapy appropriate? Yes, therapy is appropriate and should be continued    DISEASE/MEDICATION-SPECIFIC INFORMATION      For patients on injectable medications: Patient currently has 2 doses left.  Next injection is scheduled for 1/12.    PATIENT SPECIFIC NEEDS     - Does the patient have any physical, cognitive, or cultural barriers? No    - Is the patient high risk? No     - Does the patient require a Care Management Plan? No     - Does the patient require physician intervention or other additional services (i.e. nutrition, smoking cessation, social work)? No      SHIPPING     Specialty Medication(s) to be Shipped:   Humira    Other medication(s) to be shipped: na     Changes to insurance: No    Delivery Scheduled: Yes, Expected medication delivery date: Thurs, Jan 20.     Medication will be delivered via UPS to the confirmed prescription address in [facility].    The patient will receive a drug information handout for each medication shipped and additional FDA Medication Guides as required.  Verified that patient has previously received a Conservation officer, historic buildings.    All of the patient's questions and concerns have been addressed.    Carolyn Newton   [facility] Specialty Pharmacist

## 2020-02-02 MED FILL — HUMIRA PEN CITRATE FREE 40 MG/0.4 ML: SUBCUTANEOUS | 28 days supply | Qty: 4 | Fill #8

## 2020-02-07 ENCOUNTER — Telehealth: Payer: Self-pay | Admitting: Pulmonary Disease

## 2020-02-07 ENCOUNTER — Encounter: Admit: 2020-02-07 | Discharge: 2020-02-08 | Payer: MEDICARE

## 2020-02-07 DIAGNOSIS — D869 Sarcoidosis, unspecified: Secondary | ICD-10-CM | POA: Diagnosis not present

## 2020-02-07 DIAGNOSIS — L299 Pruritus, unspecified: Secondary | ICD-10-CM | POA: Diagnosis not present

## 2020-02-07 DIAGNOSIS — J449 Chronic obstructive pulmonary disease, unspecified: Secondary | ICD-10-CM

## 2020-02-07 DIAGNOSIS — J9611 Chronic respiratory failure with hypoxia: Secondary | ICD-10-CM

## 2020-02-07 DIAGNOSIS — Z79899 Other long term (current) drug therapy: Secondary | ICD-10-CM | POA: Diagnosis not present

## 2020-02-07 DIAGNOSIS — L439 Lichen planus, unspecified: Secondary | ICD-10-CM | POA: Diagnosis not present

## 2020-02-07 NOTE — Unmapped (Addendum)
Thank you for visiting [place] in clinic today, the plan we discussed was as follows:     Please continue your current medications as previously prescribed. We did not make any changes to your medications today.

## 2020-02-07 NOTE — Unmapped (Signed)
Dermatology Note     Assessment and Plan:      Sarcoidosis, cutaneous and pulmonary   1. Dr. Vassie Loll (pulmonary) in the loop, continue adalimumab 40mg  weekly (started eow 06/2016, increased to weekly 09/2016)  2. 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  3. Has had ophtho screening in the past, following with cardiologist and pulmonologist  -quant gold negative     Lichen planus  -After the patient was informed of risks, benefits and side effects of intralesional steroid injection, the patient elected to undergo injection. Informed verbal consent was obtained. Risk of atrophy  and dyspigmentation with injection was explained. Kenalog 10 mg/ml was injected locally into the sites located on the thighs, feet, hands in a clean fashion following alcohol prep.   Total volume in ml=0.5.  Number of sites treated: >7   Wound care was explained to the patient     Pruritus   - She can continue hydroxyzine 25 mg tablet take 1/4 in the morning and 1/2 in the evening as needed.  - Continue naltrexone 12.5mg  (quartering 50mg  tablets) once daily      There are no diagnoses linked to this encounter.    The patient was advised to call for an appointment should any new, changing, or symptomatic lesions develop.     RTC: Return in about 4 months (around 06/06/2020). or sooner as needed   _________________________________________________________________      Chief Complaint     Chief Complaint   Patient presents with   ??? Lichen planus     pt stated need injection on left foot.        HPI     Carolyn Newton is a 60 y.o. female who presents as a returning patient (last seen by Dr. Janyth Contes on 05/24/2019) to [facility] for follow up of lichen planus.. At last visit, patient underwent steroid injections without complication.    Overall, the patient reports her symptoms have persisted, but she has not experienced any acute or sudden changes.     Lichen planus  Location: bilateral legs  Duration: Several years  Treatment/Modifying factors: Improved with steroid injections   Associated signs/ Symptoms: Irritation and bruising    The patient denies any other new or changing lesions or areas of concern.     Pertinent Past Medical History     No history of skin cancer    Previous disease course:    Long history of pulmonary and cutaneous sarcoidosis since 2003-2004.  Many years on and off of prednisone with temporary improvement, but overall thinks she is worse over time.  Has had a large amount of weight gain in the last year since restarting prednisone in April 2016.  Had come off recently, but now on Medrol dose pack from her PCP.  Was on methotrexate given worsening cutaneous involvement of the scalp.  Dose was 10mg  taken 2 doses 12 hours apart in winter 2017-18.  Did that for 3 months, but stopped because she thought it wasn't helping.  Had been on hydroxychloroquine many years ago, but it was not very helpful.  Has been on doxycycline many times over the years without much improvement.    Family History:   Negative for melanoma    Past Medical History, Family History, Social History, Medication List, Allergies, and Problem List were reviewed in the rooming section of Epic.     ROS: Other than symptoms mentioned in the HPI, no fevers, chills, or  other skin complaints    Physical Examination     GENERAL: Well-appearing female in no acute distress, resting comfortably.  NEURO: Alert and oriented, answers questions appropriately  PSYCH: Normal mood and affect  SKIN: Examination of the hair, scalp, face, neck, upper chest, bilateral upper extremities, bilateral lower extremities and feet was performed  - Erythematous to violaceous scaly papules and plaques on left ankle and left thigh   - An atrophic plaque on left upper arm  - Similar atrophic patches on scalp     All areas not commented on are within normal limits or unremarkable    Scribe's Attestation: Tammi Sou, MD obtained and performed the history, physical exam and medical decision making elements that were  entered into the chart. Documentation assistance was provided by me personally, a scribe. Signed by Joylene Draft, Scribe, on February 07, 2020 10:49 AM    ----------------------------------------------------------------------------------------------------------------------  February 07, 2020 10:14 PM. Documentation assistance provided by the Scribe. I was present during the time the encounter was recorded. The information recorded by the Scribe was done at my direction and has been reviewed and validated by me.  ----------------------------------------------------------------------------------------------------------------------        (Approved Template 09/27/2019)

## 2020-02-08 MED ADMIN — triamcinolone acetonide (KENALOG-40) injection 40 mg: 40 mg | INTRALESIONAL | @ 03:00:00 | Stop: 2020-02-07

## 2020-02-08 NOTE — Telephone Encounter (Signed)
lmtcb for pt.  

## 2020-02-10 ENCOUNTER — Other Ambulatory Visit: Payer: Self-pay | Admitting: Internal Medicine

## 2020-02-10 NOTE — Telephone Encounter (Signed)
OK to send.

## 2020-02-10 NOTE — Telephone Encounter (Signed)
Called and spoke to pt. Pt is requesting a humidifier bottle for her home concentrator. Pt has upcoming appt with TP on 2.1.22.   Dr. Elsworth Soho, please advise. Thanks.

## 2020-02-10 NOTE — Telephone Encounter (Signed)
Order has been placed for pt to receive humidifier bottle for her to use with her O2. Called and spoke with pt letting her know this had been done and she verbalized understanding. Nothing further needed.

## 2020-02-15 ENCOUNTER — Ambulatory Visit: Payer: Medicare Other | Admitting: Adult Health

## 2020-02-15 ENCOUNTER — Other Ambulatory Visit: Payer: Self-pay | Admitting: Internal Medicine

## 2020-02-15 ENCOUNTER — Ambulatory Visit: Payer: Medicare Other | Admitting: Internal Medicine

## 2020-02-15 ENCOUNTER — Ambulatory Visit: Payer: Medicare Other

## 2020-02-23 ENCOUNTER — Other Ambulatory Visit: Payer: Self-pay | Admitting: Internal Medicine

## 2020-02-23 NOTE — Unmapped (Signed)
[facility] Refill Coordination Note    Specialty Medication(s) to be Shipped:   Inflammatory Disorders: Humira    Other medication(s) to be shipped: No additional medications requested for fill at this time     Carolyn Newton, DOB: 08/04/1960  Phone: [phone] (home)       All above HIPAA information was verified with patient.     Was a Nurse, learning disability used for this call? No    Completed refill call assessment today to schedule patient's medication shipment from the [facility] [phone]).       Specialty medication(s) and dose(s) confirmed: Regimen is correct and unchanged.   Changes to medications: Carolyn Newton reports no changes at this time.  Changes to insurance: No  Questions for the pharmacist: No    Confirmed patient received Welcome Packet with first shipment. The patient will receive a drug information handout for each medication shipped and additional FDA Medication Guides as required.       DISEASE/MEDICATION-SPECIFIC INFORMATION        For patients on injectable medications: Patient currently has 2 doses left.  Next injection is scheduled for 02/23/2020.    SPECIALTY MEDICATION ADHERENCE     Medication Adherence    Patient reported X missed doses in the last month: 0  Specialty Medication: Humira CF 40 mg/0.4 ml  Patient is on additional specialty medications: No  Any gaps in refill history greater than 2 weeks in the last 3 months: no  Demonstrates understanding of importance of adherence: yes  Informant: patient  Reliability of informant: reliable  Confirmed plan for next specialty medication refill: delivery by pharmacy  Refills needed for supportive medications: not needed         SHIPPING     Shipping address confirmed in Epic.     Delivery Scheduled: Yes, Expected medication delivery date: 03/01/2020.     Medication will be delivered via UPS to the prescription address in Epic WAM.    Carolyn Newton D Shenna Brissette   [facility] Specialty Technician

## 2020-02-29 MED FILL — HUMIRA PEN CITRATE FREE 40 MG/0.4 ML: SUBCUTANEOUS | 28 days supply | Qty: 4 | Fill #9

## 2020-03-02 ENCOUNTER — Ambulatory Visit (INDEPENDENT_AMBULATORY_CARE_PROVIDER_SITE_OTHER): Payer: Medicare Other | Admitting: Adult Health

## 2020-03-02 ENCOUNTER — Other Ambulatory Visit: Payer: Self-pay | Admitting: Internal Medicine

## 2020-03-02 ENCOUNTER — Other Ambulatory Visit: Payer: Self-pay

## 2020-03-02 ENCOUNTER — Encounter: Payer: Self-pay | Admitting: Adult Health

## 2020-03-02 VITALS — BP 120/80 | HR 67 | Temp 97.2°F | Ht 67.0 in | Wt 263.4 lb

## 2020-03-02 DIAGNOSIS — J449 Chronic obstructive pulmonary disease, unspecified: Secondary | ICD-10-CM

## 2020-03-02 DIAGNOSIS — D869 Sarcoidosis, unspecified: Secondary | ICD-10-CM

## 2020-03-02 DIAGNOSIS — J9611 Chronic respiratory failure with hypoxia: Secondary | ICD-10-CM | POA: Diagnosis not present

## 2020-03-02 NOTE — Assessment & Plan Note (Signed)
Stable on current regimen   Plan  Patient Instructions  Continue on Symbicort Twice daily  . Rinse after use.  Mucinex DM Twice daily  As needed  Cough/congestion  Continue on Oxygen 2l/m .  Order to DME to evaluate POC and Home concentrator.  Activity as tolerated.  Continue on Xarelto .  Work on healthy weight loss.  Please follow up with Dr. Alain Marion regarding your depression .  Follow up Dr. Elsworth Soho in 4  months and As needed   Please contact office for sooner follow up if symptoms do not improve or worsen or seek emergency care

## 2020-03-02 NOTE — Assessment & Plan Note (Signed)
Order to DME to evaluate O2 devices   Plan  Patient Instructions  Continue on Symbicort Twice daily  . Rinse after use.  Mucinex DM Twice daily  As needed  Cough/congestion  Continue on Oxygen 2l/m .  Order to DME to evaluate POC and Home concentrator.  Activity as tolerated.  Continue on Xarelto .  Work on healthy weight loss.  Please follow up with Dr. Alain Marion regarding your depression .  Follow up Dr. Elsworth Soho in 4  months and As needed   Please contact office for sooner follow up if symptoms do not improve or worsen or seek emergency care

## 2020-03-02 NOTE — Assessment & Plan Note (Signed)
F/up PCP for further evaluation and possible treatment .

## 2020-03-02 NOTE — Patient Instructions (Addendum)
Continue on Symbicort Twice daily  . Rinse after use.  Mucinex DM Twice daily  As needed  Cough/congestion  Continue on Oxygen 2l/m .  Order to DME to evaluate POC and Home concentrator.  Activity as tolerated.  Continue on Xarelto .  Work on healthy weight loss.  Please follow up with Dr. Alain Marion regarding your depression .  Follow up Dr. Elsworth Soho in 4  months and As needed   Please contact office for sooner follow up if symptoms do not improve or worsen or seek emergency care

## 2020-03-02 NOTE — Assessment & Plan Note (Signed)
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

## 2020-03-02 NOTE — Progress Notes (Signed)
@Patient  ID: [name], female    DOB: 1960/10/11, 60 y.o.   MRN: [id number]  Chief Complaint  Patient presents with  . 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  She has chronic respiratory failure on oxygen at 2 L Patient has obstructive sleep apnea is CPAP intolerant  TEST/EVENTS :  2018 started on Humira for reoccurring cutaneous lesions not responding to methotrexate. Previously on prednisone but tapered off.  Admitted 07/21/2016 for RLE DVT-had stoppedXarelto 04/2016 for prior pulmonary embolism  CT scan = chronic appearing subsegmental PE ,on Xarelto lifelong  PFT 05/2014 >> FVC 65%, ratio 69, FEV1 58%, DLCO 62%  VQ Scan 08/17/15 >>LLL &RUL acute PE   ECHO 08/18/15 >>mild LVH , EF 55-60%, Gr 1 DD .   LE Venous Doppler 08/18/15 >>neg DVT , no bakers cyst, ?prominent inguinal lymph node measuring 3.1cm   MRI brain neg [facility] = MRI spine = mild to moderate disc protrusion/left-sided spinal stenosis-chronic pain  NPSG 12/2004 mild, RDI 13/h  SARS-CoV-2 July 23, 2018- negative  CT chest July 2020 showed stable sarcoid to mildly decreased.(Nodular opacities (stable mediastinal and bilateral hilar adenopathy  decreased.(Nodular opacities (stable mediastinal and bilateral hilar adenopathy.  PFTs done in July 2020 showed stable lung function with FEV1 at 70%, ratio 81, FVC 67% and DLCO was 61%.  This was similar to 2016  03/02/2020 Follow up ; sarcoidosis, PE, oxygen dependent respiratory failure Patient presents for a follow up visit.  Patient was last seen January 28, 2019.  Patient has underlying sarcoidosis with pulmonary and cutaneous involvement.  She has a history of recurrent PE on lifelong anticoagulation  with Xarelto.  Since last visit patient says overall she has been doing okay with breathing , no flare of cough or dyspnea.  She remains on Symbicort twice daily. Patient is doing well with weight loss.  She is down 30 pounds over the last 2 years.  She remains on Xarelto.  Denies any known complications or bleeding.  Very tearful. Lost brother , feels very sad. Not close to sisters.  Has son and grandson. Lives alone.  Does not live in safe area. Very upset. Has financial difficulties   Remains on O2 2l/m . Says POC not working well and home concentrator not working well with humidifier . Discussed we DME to evaluate .        Allergies  Allergen Reactions  . Belsomra [Suvorexant] Other (See Comments)    [place] out of bed while asleep: "I'm waking up as I'm falling on the floor;" "Night terrors"  . Enalapril Maleate Cough  . Latex Hives  . Nickel Other (See Comments)    Blisters  Pt has a titanium right and left knee - nickel causes skin irritations that form into blisters and sores  . Other Other (See Comments)    Patient has Sarcoidosis and can't tolerate any metals  . Morphine And Related Itching and Nausea Only  . Doxycycline Diarrhea and Nausea And Vomiting  . Hydrochlorothiazide Other (See Comments)    Low potassium levels   . Hydroxychloroquine Sulfate Other (See Comments)    Vision changes   . Lyrica [Pregabalin] Other (See Comments)    "Made me feel high"  . Tape Other (See Comments)    Medical tape causes  bruising    Immunization History  Administered Date(s) Administered  . H1N1 12/24/2007  . Influenza Split 10/11/2010  . Influenza Whole 10/14/2007, 10/05/2008, 10/26/2009, 11/13/2011  . Influenza,inj,Quad PF,6+ Mos 09/21/2012, 09/24/2013, 10/06/2014, 09/05/2015, 11/06/2016, 09/24/2017, 10/07/2018, 11/15/2019  . PFIZER(Purple Top)SARS-COV-2 Vaccination 03/31/2019, 04/21/2019, 11/27/2019  . PPD Test 11/18/2014  . Pneumococcal Conjugate-13 07/30/2010, 12/29/2012   . Pneumococcal Polysaccharide-23 12/23/2013  . Tdap 06/28/2014    Past Medical History:  Diagnosis Date  . Allergic rhinitis   . ALLERGIC RHINITIS 10/26/2009  . Anxiety   . ANXIETY 08/14/2006  . B12 DEFICIENCY 08/25/2007  . Complication of anesthesia    pt has had difficulty following anesthesia with her knee in 2016-unable to care for herself afterward  . Confusion   . Depression    takes Lexapro daily  . Diabetes mellitus without complication ([place])    was on insulin but has been off since Nov 2015 and now only takes Metformin daily  . DYSPNEA 04/28/2009   with exertion  . Esophageal reflux    takes Nexium daily  . Fibromyalgia   . Headache    last migraine 2-32yr ago;takes Topamax daily  . History of shingles   . Hypertension    takes Coreg daily  . Insomnia    takes Nortriptyline nightly   . Joint pain   . Joint swelling   . Left knee pain   . Lichen planus   . Long-term memory loss   . Nocturia   . OSA (obstructive sleep apnea)    doesn't use CPAP;sleep study in epic from 2006  . Osteoarthritis   . Osteoarthritis   . Pneumonia    over 30 yrs ago  . PONV (postoperative nausea and vomiting)   . Protein calorie malnutrition ([place]   . Rheumatoid arthritis ([place]   . Sarcoidosis    Dr. ZLolita Patella . Short-term memory loss   . Shortness of breath   . Sleep apnea     wears oxygen  . TIA (transient ischemic attack)   . Unsteady gait   . Urinary urgency   . Vitamin D deficiency    is supposed to take Vit D but can't afford it  . VITAMIN D DEFICIENCY 08/25/2007    Tobacco History: Social History   Tobacco Use  Smoking Status Former Smoker  . Packs/day: 0.50  . Years: 10.00  . Pack years: 5.00  . Types: Cigarettes  Smokeless Tobacco Never Used  Tobacco Comment   quit smoking in 2004   Counseling given: Not Answered Comment: quit smoking in 2004   Outpatient Medications Prior to Visit  Medication Sig Dispense Refill  . albuterol (VENTOLIN HFA) 108 (90  Base) MCG/ACT inhaler Inhale 2 puffs into the lungs every 6 (six) hours as needed for wheezing or shortness of breath. 8.5 g 0  . augmented betamethasone dipropionate (DIPROLENE-AF) 0.05 % ointment Apply topically 2 (two) times daily.    . B-D ULTRA-FINE 33 LANCETS MISC Use as instructed to test sugars once  daily 100 each 12  . budesonide-formoterol (SYMBICORT) 160-4.5 MCG/ACT inhaler Inhale 2 puffs into the lungs TWICE DAILY 10.2 g 5  . carvedilol (COREG) 12.5 MG tablet Take 1 tablet (12.5 mg total) by mouth 2 (two) times daily with a meal. 180 tablet 3  . Cholecalciferol (VITAMIN D3) 1.25 MG (50000 UT) CAPS Take 1 capsule by mouth every 30 (thirty) days. 3 capsule 3  . clonazePAM (KLONOPIN) 0.5 MG tablet Take 1 TO 2 tablets (0.5-1 mg total) by  mouth at bedtime as needed for anxiety. 180 tablet 1  . Cyanocobalamin (VITAMIN B-12) 1000 MCG SUBL Place 1 tablet (1,000 mcg total) under the tongue daily. 100 tablet 3  . dexlansoprazole (DEXILANT) 60 MG capsule Take 1 capsule (60 mg total) by mouth daily. 90 capsule 3  . diphenoxylate-atropine (LOMOTIL) 2.5-0.025 MG tablet Take 1-2 tablets by mouth 4 (four) times daily as needed for diarrhea or loose stools. 60 tablet 0  . diphenoxylate-atropine (LOMOTIL) 2.5-0.025 MG tablet Take 1 tablet by mouth 3 (three) times daily as needed for diarrhea or loose stools. 60 tablet 3  . furosemide (LASIX) 40 MG tablet TAKE 1 TABLET BY MOUTH EVERY MORNING FOR EDEMA 90 tablet 1  . gabapentin (NEURONTIN) 300 MG capsule TAKE ONE CAPSULE BY MOUTH EVERY EVENING, MAY TAKE A SECOND CAPSULE DURING THE DAY AS NEEDED FOR PAIN 180 capsule 1  . glucose blood (ACCU-CHEK GUIDE) test strip Use to check blood sugar once a day. 100 each 12  . HUMIRA PEN 40 MG/0.4ML PNKT Inject 40 mg as directed every Wednesday.     . hydrOXYzine (ATARAX/VISTARIL) 25 MG tablet Take 1 tablet (25 mg total) by mouth every 8 (eight) hours as needed. 60 tablet 1  . lidocaine (XYLOCAINE) 5 % ointment Apply 1  application topically as needed. For bunion/toe pain 35.44 g 0  . metFORMIN (GLUCOPHAGE-XR) 500 MG 24 hr tablet Take 2 tablets (1,000 mg total) by mouth at bedtime. 180 tablet 2  . naltrexone (DEPADE) 50 MG tablet Take 12.5 mg by mouth at bedtime.     . OXYGEN Inhale into the lungs. Takes 2 liters every day (24/7)    . SUPREP BOWEL PREP KIT 17.5-3.13-1.6 GM/177ML SOLN SMARTSIG:1 Kit(s) By Mouth Once 540 mL 0  . topiramate (TOPAMAX) 50 MG tablet Take 1 tablet (50 mg total) by mouth 2 (two) times daily. 180 tablet 3  . UNABLE TO FIND Use as instructed to check blood sugar once a day.    . venlafaxine XR (EFFEXOR-XR) 75 MG 24 hr capsule Take 1 capsule (75 mg total) by mouth daily with breakfast. 30 capsule 11  . XARELTO 20 MG TABS tablet Take 1 tablet (20 mg total) by mouth at bedtime. 90 tablet 3  . potassium chloride (KLOR-CON) 10 MEQ tablet Take 1 tablet (10 mEq total) by mouth daily. 90 tablet 1   No facility-administered medications prior to visit.     Review of Systems:   Constitutional:   No  weight loss, night sweats,  Fevers, chills, + fatigue, or  lassitude.  HEENT:   No headaches,  Difficulty swallowing,  Tooth/dental problems, or  Sore throat,                No sneezing, itching, ear ache, nasal congestion, post nasal drip,   CV:  No chest pain,  Orthopnea, PND, swelling in lower extremities, anasarca, dizziness, palpitations, syncope.   GI  No heartburn, indigestion, abdominal pain, nausea, vomiting, diarrhea, change in bowel habits, loss of appetite, bloody stools.   Resp:  .  No chest wall deformity  Skin: no rash or lesions.  GU: no dysuria, change in color of urine, no urgency or frequency.  No flank pain, no hematuria   MS:  No joint pain or swelling.  No decreased range of motion.  No back pain.    Physical Exam  BP 120/80 (BP Location: Left Arm, Patient Position: Sitting, Cuff Size: Large)   Pulse 67   Temp (!) 97.2 F (  36.2 C) (Temporal)   Ht 5' 7"  (1.702  m)   Wt 263 lb 6.4 oz (119.5 kg)   LMP 05/19/2003   SpO2 90%   BMI 41.25 kg/m   GEN: A/Ox3; pleasant , NAD, BMI 41    HEENT:  [place]/AT,   NOSE-clear, THROAT-clear, no lesions, no postnasal drip or exudate noted.   NECK:  Supple w/ fair ROM; no JVD; normal carotid impulses w/o bruits; no thyromegaly or nodules palpated; no lymphadenopathy.    RESP  Clear  P & A; w/o, wheezes/ rales/ or rhonchi. no accessory muscle use, no dullness to percussion  CARD:  RRR, no m/r/g, tr  peripheral edema, pulses intact, no cyanosis or clubbing.  GI:   Soft & nt; nml bowel sounds; no organomegaly or masses detected.   Musco: Warm bil, no deformities or joint swelling noted.   Neuro: alert, no focal deficits noted.    Skin: Warm, no lesions or rashes    Lab Results:  CBC  BMET    Imaging: No results found.    PFT Results Latest Ref Rng & Units 07/27/2018 12/30/2016 05/18/2014  FVC-Pre L 2.51 2.07 2.29  FVC-Predicted Pre % 67 54 61  FVC-Post L 2.51 2.18 2.43  FVC-Predicted Post % 67 57 65  Pre FEV1/FVC % % 79 70 69  Post FEV1/FCV % % 81 68 70  FEV1-Pre L 1.99 1.46 1.58  FEV1-Predicted Pre % 68 49 54  FEV1-Post L 2.03 1.49 1.70  DLCO uncorrected ml/min/mmHg 13.89 11.74 16.78  DLCO UNC% % 61 41 62  DLCO corrected ml/min/mmHg - 11.34 -  DLCO COR %Predicted % - 40 -  DLVA Predicted % 84 66 91  TLC L 4.27 4.07 -  TLC % Predicted % 77 74 -  RV % Predicted % 84 92 -    No results found for: NITRICOXIDE      Assessment & Plan:   COPD (chronic obstructive pulmonary disease) (HCC) Stable on current regimen   Plan  Patient Instructions  Continue on Symbicort Twice daily  . Rinse after use.  Mucinex DM Twice daily  As needed  Cough/congestion  Continue on Oxygen 2l/m .  Order to DME to evaluate POC and Home concentrator.  Activity as tolerated.  Continue on Xarelto .  Work on healthy weight loss.  Please follow up with Dr. Alain Marion regarding your depression .  Follow up Dr.  Elsworth Soho in 4  months and As needed   Please contact office for sooner follow up if symptoms do not improve or worsen or seek emergency care       Chronic respiratory failure with hypoxia ([place]) Order to DME to evaluate O2 devices   Plan  Patient Instructions  Continue on Symbicort Twice daily  . Rinse after use.  Mucinex DM Twice daily  As needed  Cough/congestion  Continue on Oxygen 2l/m .  Order to DME to evaluate POC and Home concentrator.  Activity as tolerated.  Continue on Xarelto .  Work on healthy weight loss.  Please follow up with Dr. Alain Marion regarding your depression .  Follow up Dr. Elsworth Soho in 4  months and As needed   Please contact office for sooner follow up if symptoms do not improve or worsen or seek emergency care       Major depressive disorder, recurrent episode ([place]) F/up PCP for further evaluation and possible treatment .   Pulmonary embolism (HCC) Hx of recurrent PE , cont on Xarelto .  Rexene Edison, NP 03/02/2020

## 2020-03-08 ENCOUNTER — Other Ambulatory Visit: Payer: Self-pay | Admitting: *Deleted

## 2020-03-08 NOTE — Patient Outreach (Signed)
[place] [facility]) Care Management  03/08/2020  Cloyce Paterson [facility] [place] [facility] April 01, 1960 [id number]   RN Health Coach attempted follow up outreach call to patient.  Patient was unavailable. HIPPA compliance voicemail message left with return callback number.  Plan: RN will call patient again within 30 days.  [place] Care Management [phone]

## 2020-03-10 ENCOUNTER — Ambulatory Visit: Payer: Medicare Other

## 2020-03-21 ENCOUNTER — Ambulatory Visit (INDEPENDENT_AMBULATORY_CARE_PROVIDER_SITE_OTHER): Payer: Medicare (Managed Care)

## 2020-03-21 ENCOUNTER — Other Ambulatory Visit: Payer: Self-pay | Admitting: Internal Medicine

## 2020-03-21 ENCOUNTER — Other Ambulatory Visit: Payer: Self-pay

## 2020-03-21 VITALS — BP 128/70 | HR 57 | Temp 98.1°F | Ht 67.0 in | Wt 263.8 lb

## 2020-03-21 DIAGNOSIS — Z0000 Encounter for general adult medical examination without abnormal findings: Secondary | ICD-10-CM

## 2020-03-21 NOTE — Patient Instructions (Signed)
[name] , Thank you for taking time to come for your Medicare Wellness Visit. I appreciate your ongoing commitment to your health goals. Please review the following plan we discussed and let me know if I can assist you in the future.   Screening recommendations/referrals: Colonoscopy: 10/196/2021; due every 10 years Mammogram: 07/22/2019; due every year Bone Density: never done Recommended yearly ophthalmology/optometry visit for glaucoma screening and checkup Recommended yearly dental visit for hygiene and checkup  Vaccinations: Influenza vaccine: 11/15/2019 Pneumococcal vaccine: 12/29/2012, 12/23/2013 Tdap vaccine: 06/28/2014; due every 10 years Shingles vaccine: never done  Covid-19: 03/31/2019, 04/21/2019, 11/27/2019  Advanced directives: Advance directive discussed with you today. Even though you declined this today please call our office should you change your mind and we can give you the proper paperwork for you to fill out.  Conditions/risks identified: Yes; Reviewed health maintenance screenings with patient today and relevant education, vaccines, and/or referrals were provided. Please continue to do your personal lifestyle choices by: daily care of teeth and gums, regular physical activity (goal should be 5 days a week for 30 minutes), eat a healthy diet, avoid tobacco and drug use, limiting any alcohol intake, taking a low-dose aspirin (if not allergic or have been advised by your provider otherwise) and taking vitamins and minerals as recommended by your provider. Continue doing brain stimulating activities (puzzles, reading, adult coloring books, staying active) to keep memory sharp. Continue to eat heart healthy diet (full of fruits, vegetables, whole grains, lean protein, water--limit salt, fat, and sugar intake) and increase physical activity as tolerated.  Next appointment: Please schedule your next Medicare Wellness Visit with your Nurse Health Advisor in 1 year by calling  [phone].   Preventive Care 40-64 Years, Female Preventive care refers to lifestyle choices and visits with your health care provider that can promote health and wellness. What does preventive care include?  A yearly physical exam. This is also called an annual well check.  Dental exams once or twice a year.  Routine eye exams. Ask your health care provider how often you should have your eyes checked.  Personal lifestyle choices, including:  Daily care of your teeth and gums.  Regular physical activity.  Eating a healthy diet.  Avoiding tobacco and drug use.  Limiting alcohol use.  Practicing safe sex.  Taking low-dose aspirin daily starting at age 8.  Taking vitamin and mineral supplements as recommended by your health care provider. What happens during an annual well check? The services and screenings done by your health care provider during your annual well check will depend on your age, overall health, lifestyle risk factors, and family history of disease. Counseling  Your health care provider may ask you questions about your:  Alcohol use.  Tobacco use.  Drug use.  Emotional well-being.  Home and relationship well-being.  Sexual activity.  Eating habits.  Work and work Statistician.  Method of birth control.  Menstrual cycle.  Pregnancy history. Screening  You may have the following tests or measurements:  Height, weight, and BMI.  Blood pressure.  Lipid and cholesterol levels. These may be checked every 5 years, or more frequently if you are over 90 years old.  Skin check.  Lung cancer screening. You may have this screening every year starting at age 2 if you have a 30-pack-year history of smoking and currently smoke or have quit within the past 15 years.  Fecal occult blood test (FOBT) of the stool. You may have this test every year starting at  age 81.  Flexible sigmoidoscopy or colonoscopy. You may have a sigmoidoscopy every 5 years  or a colonoscopy every 10 years starting at age 55.  Hepatitis C blood test.  Hepatitis B blood test.  Sexually transmitted disease (STD) testing.  Diabetes screening. This is done by checking your blood sugar (glucose) after you have not eaten for a while (fasting). You may have this done every 1-3 years.  Mammogram. This may be done every 1-2 years. Talk to your health care provider about when you should start having regular mammograms. This may depend on whether you have a family history of breast cancer.  BRCA-related cancer screening. This may be done if you have a family history of breast, ovarian, tubal, or peritoneal cancers.  Pelvic exam and Pap test. This may be done every 3 years starting at age 63. Starting at age 33, this may be done every 5 years if you have a Pap test in combination with an HPV test.  Bone density scan. This is done to screen for osteoporosis. You may have this scan if you are at high risk for osteoporosis. Discuss your test results, treatment options, and if necessary, the need for more tests with your health care provider. Vaccines  Your health care provider may recommend certain vaccines, such as:  Influenza vaccine. This is recommended every year.  Tetanus, diphtheria, and acellular pertussis (Tdap, Td) vaccine. You may need a Td booster every 10 years.  Zoster vaccine. You may need this after age 52.  Pneumococcal 13-valent conjugate (PCV13) vaccine. You may need this if you have certain conditions and were not previously vaccinated.  Pneumococcal polysaccharide (PPSV23) vaccine. You may need one or two doses if you smoke cigarettes or if you have certain conditions. Talk to your health care provider about which screenings and vaccines you need and how often you need them. This information is not intended to replace advice given to you by your health care provider. Make sure you discuss any questions you have with your health care  provider. Document Released: 01/27/2015 Document Revised: 09/20/2015 Document Reviewed: 11/01/2014 Elsevier Interactive Patient Education  2017 [name] Prevention in the Home Falls can cause injuries. They can happen to people of all ages. There are many things you can do to make your home safe and to help prevent falls. What can I do on the outside of my home?  Regularly fix the edges of walkways and driveways and fix any cracks.  Remove anything that might make you trip as you walk through a door, such as a raised step or threshold.  Trim any bushes or trees on the path to your home.  Use bright outdoor lighting.  Clear any walking paths of anything that might make someone trip, such as rocks or tools.  Regularly check to see if handrails are loose or broken. Make sure that both sides of any steps have handrails.  Any raised decks and porches should have guardrails on the edges.  Have any leaves, snow, or ice cleared regularly.  Use sand or salt on walking paths during winter.  Clean up any spills in your garage right away. This includes oil or grease spills. What can I do in the bathroom?  Use night lights.  Install grab bars by the toilet and in the tub and shower. Do not use towel bars as grab bars.  Use non-skid mats or decals in the tub or shower.  If you need to sit down  in the shower, use a plastic, non-slip stool.  Keep the floor dry. Clean up any water that spills on the floor as soon as it happens.  Remove soap buildup in the tub or shower regularly.  Attach bath mats securely with double-sided non-slip rug tape.  Do not have throw rugs and other things on the floor that can make you trip. What can I do in the bedroom?  Use night lights.  Make sure that you have a light by your bed that is easy to reach.  Do not use any sheets or blankets that are too big for your bed. They should not hang down onto the floor.  Have a firm chair that  has side arms. You can use this for support while you get dressed.  Do not have throw rugs and other things on the floor that can make you trip. What can I do in the kitchen?  Clean up any spills right away.  Avoid walking on wet floors.  Keep items that you use a lot in easy-to-reach places.  If you need to reach something above you, use a strong step stool that has a grab bar.  Keep electrical cords out of the way.  Do not use floor polish or wax that makes floors slippery. If you must use wax, use non-skid floor wax.  Do not have throw rugs and other things on the floor that can make you trip. What can I do with my stairs?  Do not leave any items on the stairs.  Make sure that there are handrails on both sides of the stairs and use them. Fix handrails that are broken or loose. Make sure that handrails are as long as the stairways.  Check any carpeting to make sure that it is firmly attached to the stairs. Fix any carpet that is loose or worn.  Avoid having throw rugs at the top or bottom of the stairs. If you do have throw rugs, attach them to the floor with carpet tape.  Make sure that you have a light switch at the top of the stairs and the bottom of the stairs. If you do not have them, ask someone to add them for you. What else can I do to help prevent falls?  Wear shoes that:  Do not have high heels.  Have rubber bottoms.  Are comfortable and fit you well.  Are closed at the toe. Do not wear sandals.  If you use a stepladder:  Make sure that it is fully opened. Do not climb a closed stepladder.  Make sure that both sides of the stepladder are locked into place.  Ask someone to hold it for you, if possible.  Clearly mark and make sure that you can see:  Any grab bars or handrails.  First and last steps.  Where the edge of each step is.  Use tools that help you move around (mobility aids) if they are needed. These  include:  Canes.  Walkers.  Scooters.  Crutches.  Turn on the lights when you go into a dark area. Replace any light bulbs as soon as they burn out.  Set up your furniture so you have a clear path. Avoid moving your furniture around.  If any of your floors are uneven, fix them.  If there are any pets around you, be aware of where they are.  Review your medicines with your doctor. Some medicines can make you feel dizzy. This can increase your chance of falling.  Ask your doctor what other things that you can do to help prevent falls. This information is not intended to replace advice given to you by your health care provider. Make sure you discuss any questions you have with your health care provider. Document Released: 10/27/2008 Document Revised: 06/08/2015 Document Reviewed: 02/04/2014 Elsevier Interactive Patient Education  2017 [employer].
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
## 2020-03-22 NOTE — Unmapped (Signed)
[facility] Refill Coordination Note    Specialty Medication(s) to be Shipped:   Inflammatory Disorders: Humira    Other medication(s) to be shipped: No additional medications requested for fill at this time     Carolyn Newton, DOB: 10-06-1960  Phone: [phone] (home)       All above HIPAA information was verified with patient.     Was a Nurse, learning disability used for this call? No    Completed refill call assessment today to schedule patient's medication shipment from the [facility] [phone]).       Specialty medication(s) and dose(s) confirmed: Regimen is correct and unchanged.   Changes to medications: Carolyn Newton reports no changes at this time.  Changes to insurance: No  Questions for the pharmacist: No    Confirmed patient received Welcome Packet with first shipment. The patient will receive a drug information handout for each medication shipped and additional FDA Medication Guides as required.       DISEASE/MEDICATION-SPECIFIC INFORMATION        For patients on injectable medications: Patient currently has 2 doses left.  Next injection is scheduled for 03/22/2020.    SPECIALTY MEDICATION ADHERENCE     Medication Adherence    Patient reported X missed doses in the last month: 0  Specialty Medication: Humira CF 40 mg/0.4 ml   Patient is on additional specialty medications: No  Any gaps in refill history greater than 2 weeks in the last 3 months: no  Demonstrates understanding of importance of adherence: yes  Informant: patient  Reliability of informant: reliable  Confirmed plan for next specialty medication refill: delivery by pharmacy  Refills needed for supportive medications: not needed         SHIPPING     Shipping address confirmed in Epic.     Delivery Scheduled: Yes, Expected medication delivery date: 03/29/2020.     Medication will be delivered via UPS to the prescription address in Epic WAM.    Richardo Popoff D Shandricka Monroy   [facility] Specialty Technician

## 2020-03-28 MED FILL — HUMIRA PEN CITRATE FREE 40 MG/0.4 ML: SUBCUTANEOUS | 28 days supply | Qty: 4 | Fill #10

## 2020-03-31 IMAGING — US US ABDOMEN LIMITED
1 series · 14 of 25 positions shown · non-contrast
Comparison: CT abdomen/pelvis dated 10/13/2017

CLINICAL DATA: Right upper quadrant pain, status post
cholecystostomy

EXAM:
ULTRASOUND ABDOMEN LIMITED RIGHT UPPER QUADRANT

[Series 1: us abdomen limited · 14 of 64 slices shown]
[im 1/64]
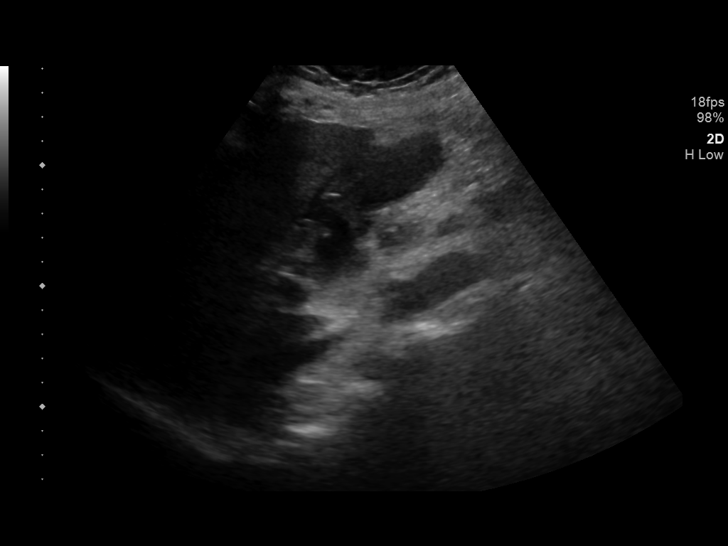
[im 6/64]
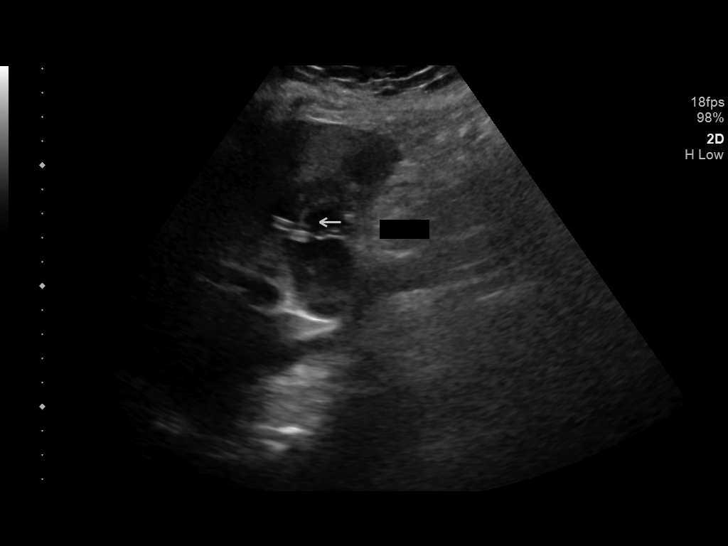
[im 11/64]
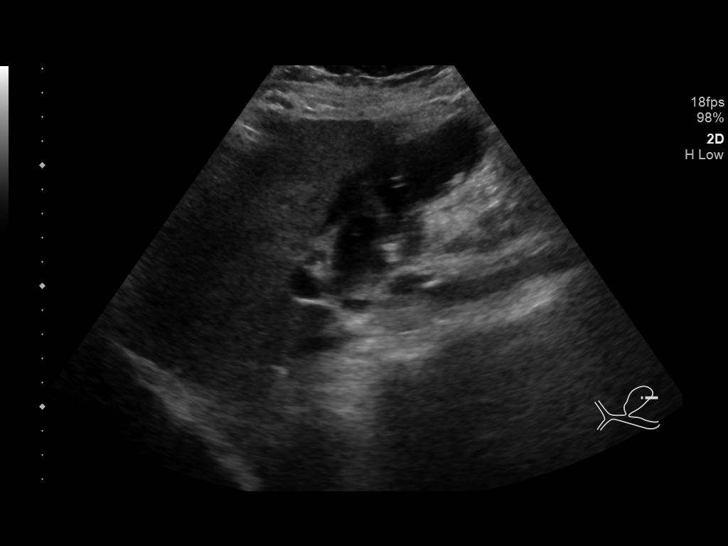
[im 16/64]
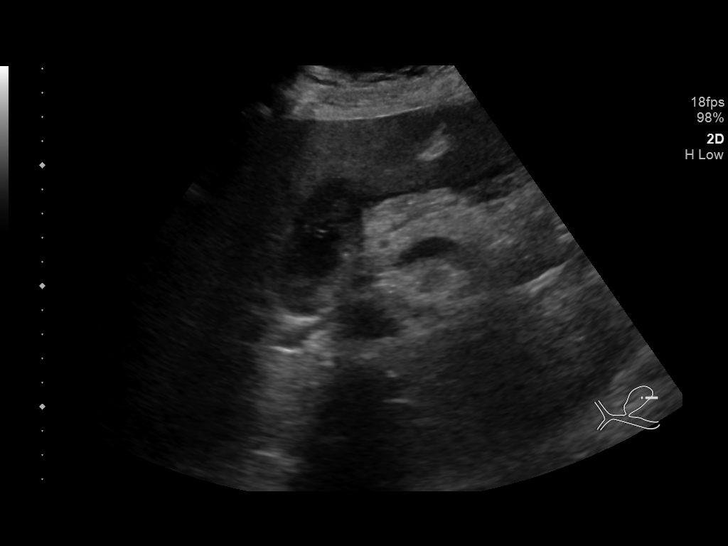
[im 22/64]
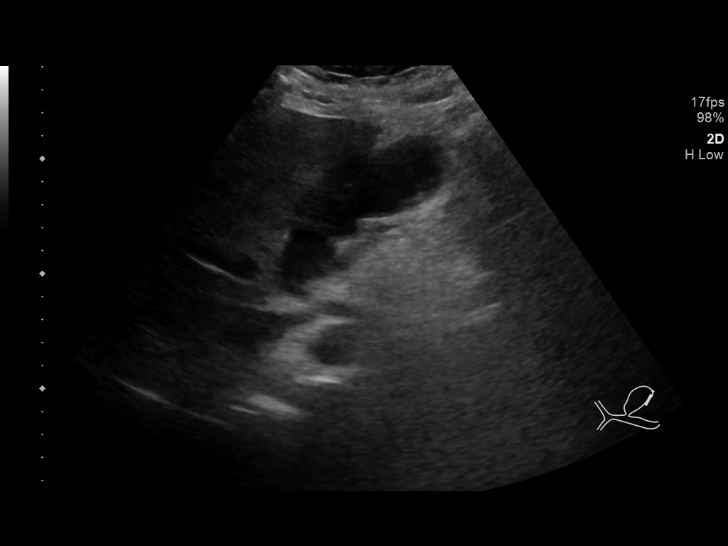
[im 24/64]
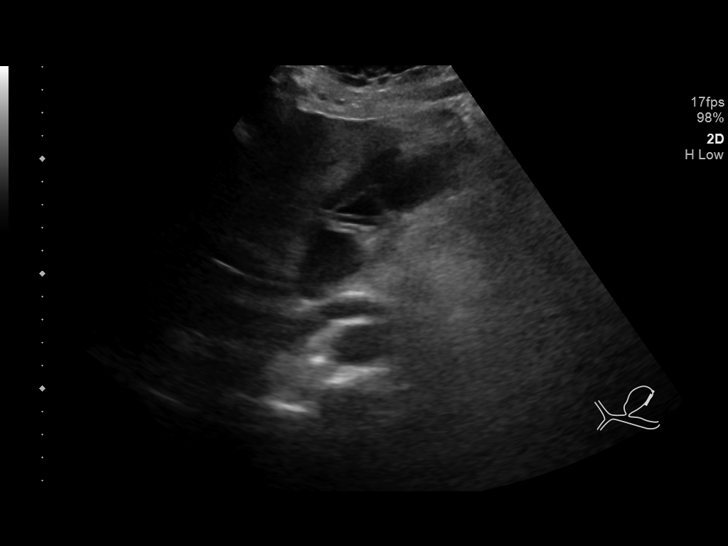
[im 29/64]
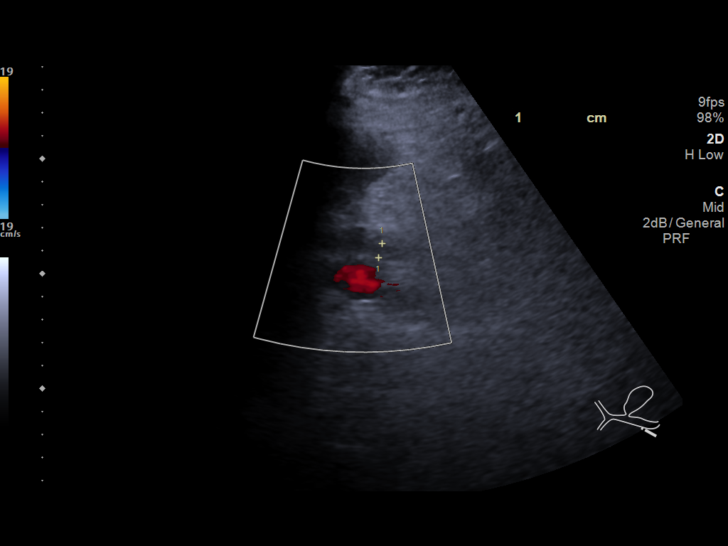
[im 35/64]
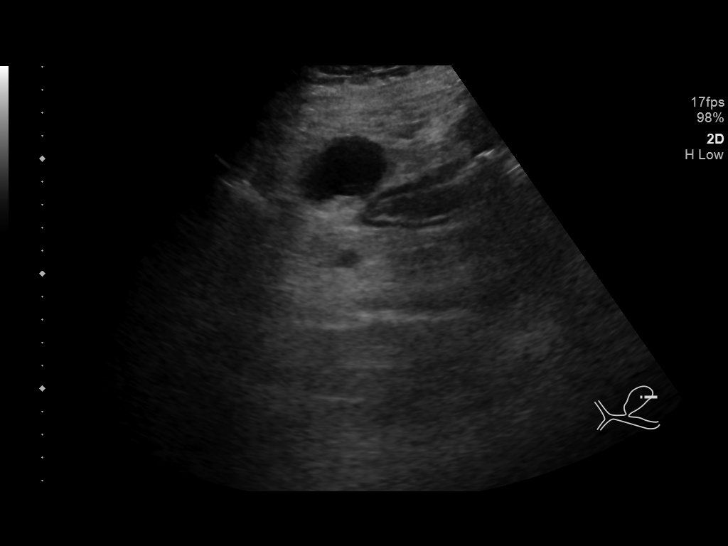
[im 40/64]
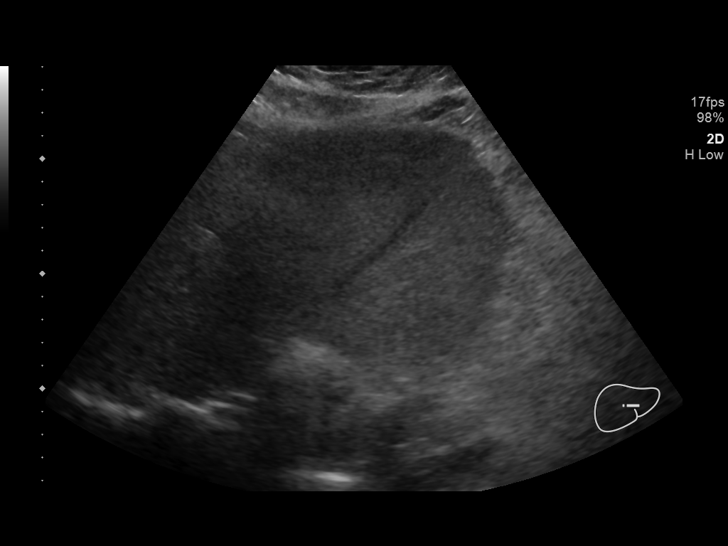
[im 43/64]
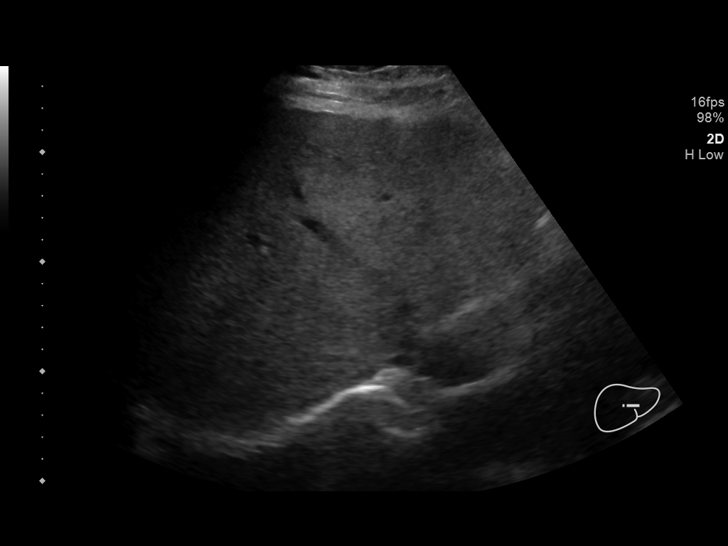
[im 48/64]
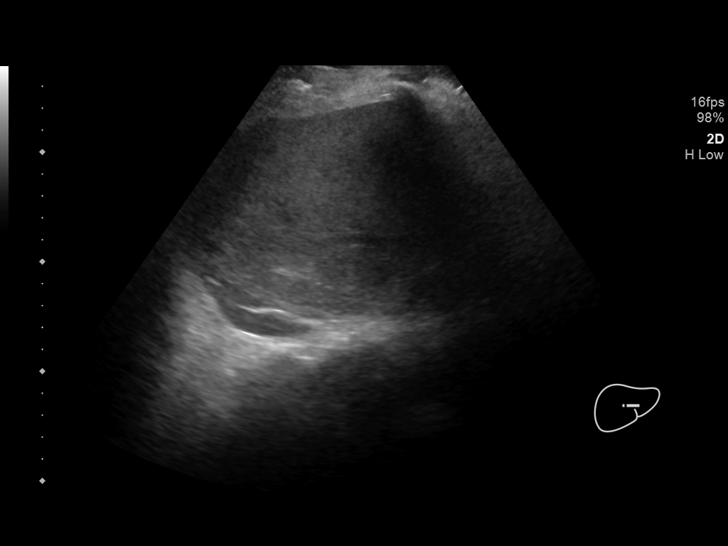
[im 53/64]
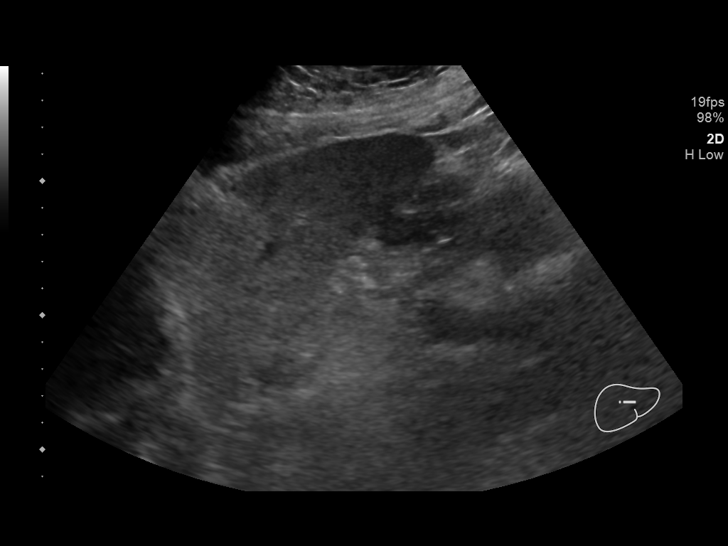
[im 58/64]
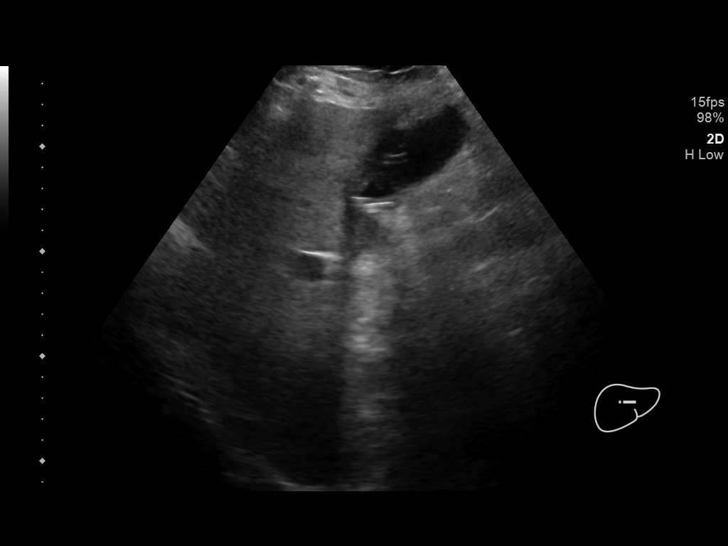
[im 64/64]
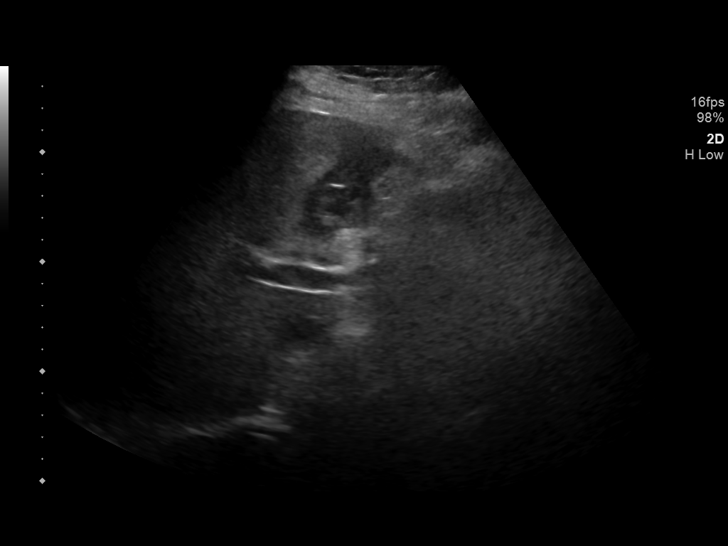

[14 of 25 positions shown; findings below may reference images not displayed]

FINDINGS: Gallbladder:

Mild wall thickening with layering sludge and indwelling
cholecystostomy tube. No definite pericholecystic fluid. Positive
sonographic Murphy's sign.

Common bile duct:

Diameter: 6 mm

Liver:

Hyperechoic hepatic parenchyma with coarse hepatic echotexture. No
focal hepatic lesion is seen. Portal vein is patent on color Doppler
imaging with normal direction of blood flow towards the liver.
IMPRESSION: Cholecystostomy tube in satisfactory position. Associated
gallbladder wall thickening with layering sludge.

Hyperechoic hepatic parenchyma with coarse hepatic echotexture,
suggesting hepatic steatosis.

## 2020-04-02 IMAGING — NM NM HEPATOBILIARY IMAGE, INC GB
1 series · 1 of 1 positions shown · non-contrast
Comparison: Right upper quadrant ultrasound dated 10/14/2017. CT
abdomen/pelvis dated 10/13/2017.

CLINICAL DATA: Acute cholecystitis, status post cholecystostomy,
inflammation on CT/ultrasound

EXAM:
NUCLEAR MEDICINE HEPATOBILIARY IMAGING
TECHNIQUE: Sequential images of the abdomen were obtained [DATE] minutes
following intravenous administration of radiopharmaceutical.
RADIOPHARMACEUTICALS:  7.49 mCi Dc-ZZm  Choletec IV

[morph · 1 of 1 slices shown]
[im 1/1]
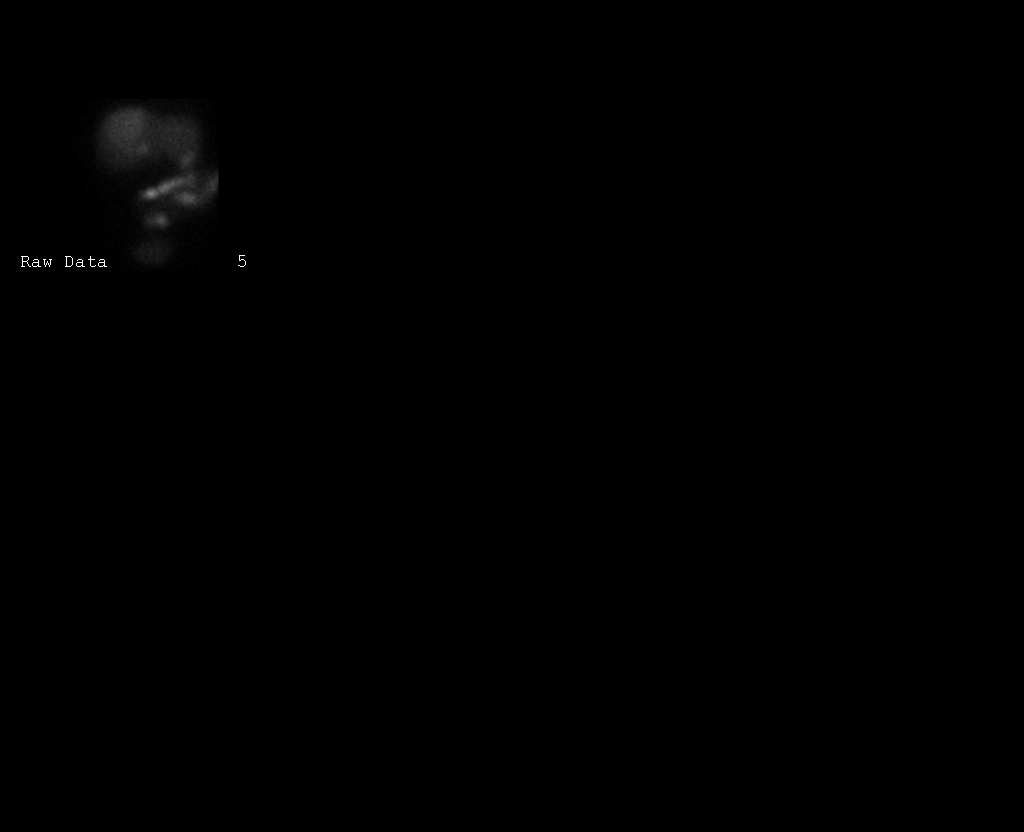

[1 of 1 positions shown; findings below may reference images not displayed]

FINDINGS: Prompt uptake and biliary excretion of activity by the liver is
seen.

Biliary activity passes into small bowel, consistent with patent
common bile duct.

Following morphine administration, gallbladder activity is
visualized, consistent with patency of cystic duct.
IMPRESSION: Patent cystic and common duct in this patient status post
cholecystostomy for acute cholecystitis.

## 2020-04-03 NOTE — Unmapped (Signed)
Error

## 2020-04-05 ENCOUNTER — Telehealth: Payer: Self-pay | Admitting: *Deleted

## 2020-04-05 ENCOUNTER — Other Ambulatory Visit: Payer: Self-pay | Admitting: *Deleted

## 2020-04-05 DIAGNOSIS — J441 Chronic obstructive pulmonary disease with (acute) exacerbation: Secondary | ICD-10-CM

## 2020-04-05 NOTE — Patient Instructions (Signed)
Goals Addressed            This Visit's Progress   . (THN)Eat Healthy   Not on track    Timeframe:  Long-Range Goal Priority:  Medium Start Date:      03500938                       Expected End Date:    18299371                  Follow Up Date 69678938   - set goal weight - drink 6 to 8 glasses of water each day - manage portion size - set a realistic goal - switch to sugar-free drinks    Why is this important?   When you are ready to manage your nutrition or weight, having a plan and setting goals will help.  Taking small steps to change how you eat and exercise is a good place to start.    Notes:  Patient calls out to order food. Need to work on making healthy choices\ [id number] Patient is eating one meal a day    . (THN)Learn and Do Breathing Exercises   On track    Timeframe:  Long-Range Goal Priority:  Medium Start Date:  58527782                           Expected End Date:  42353614                    Follow Up Date 43154008   - do exercises in a comfortable position that makes breathing as easy as possible    Why is this important?    Breathing exercises can help lessen the cough that comes with chronic obstructive pulmonary disease.   Doing the exercises will give you more energy.   They will also help you to control your symptoms.    Notes:     . (THN)Manage Fatigue (Tiredness)   On track    Timeframe:  Long-Range Goal Priority:  High Start Date:    67619509                         Expected End Date: 32671245                     Follow Up Date 80998338   - eat healthy - use devices that will help like a cane, sock-puller or reacher    Why is this important?    Feeling tired or worn out is a common symptom of COPD (chronic obstructive pulmonary disease).   Learning when you feel your best and when you need rest is important.   Managing the tiredness (fatigue) will help you be active and enjoy life.     Notes:  Patient takes frequent times yo  rest. She is on O2 continuously and gets short of breath with min exertion    . (THN)Track and Manage My Symptoms   On track    Timeframe:  Long-Range Goal Priority:  Medium Start Date: 25053976                            Expected End Date:      73419379                Follow Up Date 02409735   -  develop a rescue plan - eliminate symptom triggers at home - follow rescue plan if symptoms flare-up - keep follow-up appointments    Why is this important?    Tracking your symptoms and other information about your health helps your doctor plan your care.   Write down the symptoms, the time of day, what you were doing and what medicine you are taking.   You will soon learn how to manage your symptoms.     Notes:     . (THN)Track and Manage My Triggers   On track    Timeframe:  Long-Range Goal Priority:  Medium Start Date:     01100349                        Expected End Date:  61164353                    Follow Up Date 91225834 - avoid second hand smoke - limit outdoor activity during cold weather - listen for public air quality announcements every day    Why is this important?    Triggers are activities or things, like tobacco smoke or cold weather, that make your COPD (chronic obstructive pulmonary disease) flare-up.   Knowing these triggers helps you plan how to stay away from them.   When you cannot remove them, you can learn how to manage them.     Notes:

## 2020-04-05 NOTE — Patient Outreach (Signed)
[facility]) Care Management  [place]  04/05/2020   [name] [facility] January 20, 1960 [id number]  RN Health Coach telephone call to patient.  Hipaa compliance verified. Per patient she is breathing good. Patient is on oxygen 2 liters. Per patient her appetite is good but she only eats one meal a day due to abdominal pain. Per patient she is afraid of the neighborhood she is living in. Her son is going to get a home and patient is to move in. Per patient she uses a walker of cane. Per patient she is driving but has to pay the neighbor to borrow her car to go get groceries. Per patient she is doing some exercises with hand weights to exercise her upper body. Patient has agreed to further outreach calls.   Encounter Medications:  Outpatient Encounter Medications as of 04/05/2020  Medication Sig Note  . albuterol (VENTOLIN HFA) 108 (90 Base) MCG/ACT inhaler Inhale 2 puffs into the lungs every 6 (six) hours as needed for wheezing or shortness of breath.   [name] augmented betamethasone dipropionate (DIPROLENE-AF) 0.05 % ointment Apply topically 2 (two) times daily.   . B-D ULTRA-FINE 33 LANCETS MISC Use as instructed to test sugars once  daily   . budesonide-formoterol (SYMBICORT) 160-4.5 MCG/ACT inhaler Inhale 2 puffs into the lungs TWICE DAILY   . carvedilol (COREG) 12.5 MG tablet Take 1 tablet (12.5 mg total) by mouth 2 (two) times daily with a meal.   . Cholecalciferol (VITAMIN D3) 1.25 MG (50000 UT) CAPS Take 1 capsule by mouth every 30 (thirty) days.   . clonazePAM (KLONOPIN) 0.5 MG tablet Take 1 TO 2 tablets (0.5-1 mg total) by mouth at bedtime as needed for anxiety.   . Cyanocobalamin (VITAMIN B-12) 1000 MCG SUBL Place 1 tablet (1,000 mcg total) under the tongue daily.   [name] dexlansoprazole (DEXILANT) 60 MG capsule Take 1 capsule (60 mg total) by mouth daily.   . diphenoxylate-atropine (LOMOTIL) 2.5-0.025 MG tablet Take 1-2 tablets by mouth 4 (four) times daily as  needed for diarrhea or loose stools.   . diphenoxylate-atropine (LOMOTIL) 2.5-0.025 MG tablet Take 1 tablet by mouth 3 (three) times daily as needed for diarrhea or loose stools.   . furosemide (LASIX) 40 MG tablet TAKE 1 TABLET BY MOUTH EVERY MORNING FOR EDEMA   . gabapentin (NEURONTIN) 300 MG capsule TAKE ONE CAPSULE BY MOUTH EVERY EVENING, MAY TAKE A SECOND CAPSULE DURING THE DAY AS NEEDED FOR PAIN   . glucose blood (ACCU-CHEK GUIDE) test strip Use to check blood sugar once a day.   [name] HUMIRA PEN 40 MG/0.4ML PNKT Inject 40 mg as directed every Wednesday.    . hydrOXYzine (ATARAX/VISTARIL) 25 MG tablet Take 1 tablet (25 mg total) by mouth every 8 (eight) hours as needed.   . lidocaine (XYLOCAINE) 5 % ointment Apply 1 application topically as needed. For bunion/toe pain 10/25/2019: Hasnt started  . metFORMIN (GLUCOPHAGE-XR) 500 MG 24 hr tablet Take 2 tablets (1,000 mg total) by mouth at bedtime.   . naltrexone (DEPADE) 50 MG tablet Take 12.5 mg by mouth at bedtime.    . OXYGEN Inhale into the lungs. Takes 2 liters every day (24/7)   . potassium chloride (KLOR-CON) 10 MEQ tablet Take 1 tablet (10 mEq total) by mouth daily.   Manus Gunning BOWEL PREP KIT 17.5-3.13-1.6 GM/177ML SOLN SMARTSIG:1 Kit(s) By Mouth Once   . topiramate (TOPAMAX) 50 MG tablet Take 1 tablet (50 mg total) by mouth 2 (two) times  daily.   [name] UNABLE TO FIND Use as instructed to check blood sugar once a day.   . venlafaxine XR (EFFEXOR-XR) 75 MG 24 hr capsule Take 1 capsule (75 mg total) by mouth daily with breakfast.   . XARELTO 20 MG TABS tablet Take 1 tablet (20 mg total) by mouth at bedtime.    No facility-administered encounter medications on file as of 04/05/2020.    Functional Status:  In your present state of health, do you have any difficulty performing the following activities: 03/21/2020  Hearing? Y  Vision? N  Difficulty concentrating or making decisions? Y  Walking or climbing stairs? Y  Dressing or bathing? Y  Doing  errands, shopping? N  Preparing Food and eating ? N  Using the Toilet? N  In the past six months, have you accidently leaked urine? N  Do you have problems with loss of bowel control? N  Managing your Medications? N  Managing your Finances? N  Housekeeping or managing your Housekeeping? Y  Some recent data might be hidden    Fall/Depression Screening: Fall Risk  04/05/2020 03/21/2020 11/26/2019  Falls in the past year? 1 1 0  Comment - - -  Number falls in past yr: 0 0 0  Injury with Fall? 0 0 0  Risk Factor Category  - - -  Risk for fall due to : History of fall(s);Impaired balance/gait;Impaired mobility Impaired balance/gait Impaired balance/gait;Impaired mobility  Follow up Falls evaluation completed Falls evaluation completed Falls evaluation completed   PHQ 2/9 Scores 03/21/2020 02/02/2019 04/29/2018 04/29/2018 11/07/2017 10/06/2017 10/02/2017  PHQ - 2 Score _0 0 0  PHQ- 9 Score _1 - -    Assessment:  Goals Addressed            This Visit's Progress   . (THN)Eat Healthy   Not on track    Timeframe:  Long-Range Goal Priority:  Medium Start Date:      16606301                       Expected End Date:    60109323                  Follow Up Date 55732202   - set goal weight - drink 6 to 8 glasses of water each day - manage portion size - set a realistic goal - switch to sugar-free drinks    Why is this important?   When you are ready to manage your nutrition or weight, having a plan and setting goals will help.  Taking small steps to change how you eat and exercise is a good place to start.    Notes:  Patient calls out to order food. Need to work on making healthy choices\ [id number] Patient is eating one meal a day    . (THN)Learn and Do Breathing Exercises   On track    Timeframe:  Long-Range Goal Priority:  Medium Start Date:  76283151                           Expected End Date:  76160737                    Follow Up Date 10626948   - do  exercises in a comfortable position that makes breathing as easy as possible    Why is this important?  Breathing exercises can help lessen the cough that comes with chronic obstructive pulmonary disease.   Doing the exercises will give you more energy.   They will also help you to control your symptoms.    Notes:     . (THN)Manage Fatigue (Tiredness)   On track    Timeframe:  Long-Range Goal Priority:  High Start Date:    16945038                         Expected End Date: 88280034                     Follow Up Date 91791505   - eat healthy - use devices that will help like a cane, sock-puller or reacher    Why is this important?    Feeling tired or worn out is a common symptom of COPD (chronic obstructive pulmonary disease).   Learning when you feel your best and when you need rest is important.   Managing the tiredness (fatigue) will help you be active and enjoy life.     Notes:  Patient takes frequent times yo rest. She is on O2 continuously and gets short of breath with min exertion    . (THN)Track and Manage My Symptoms   On track    Timeframe:  Long-Range Goal Priority:  Medium Start Date: 69794801                            Expected End Date:      65537482                Follow Up Date 70786754   - develop a rescue plan - eliminate symptom triggers at home - follow rescue plan if symptoms flare-up - keep follow-up appointments    Why is this important?    Tracking your symptoms and other information about your health helps your doctor plan your care.   Write down the symptoms, the time of day, what you were doing and what medicine you are taking.   You will soon learn how to manage your symptoms.     Notes:     . (THN)Track and Manage My Triggers   On track    Timeframe:  Long-Range Goal Priority:  Medium Start Date:     49201007                        Expected End Date:  12197588                    Follow Up Date 32549826 - avoid second hand  smoke - limit outdoor activity during cold weather - listen for public air quality announcements every day    Why is this important?    Triggers are activities or things, like tobacco smoke or cold weather, that make your COPD (chronic obstructive pulmonary disease) flare-up.   Knowing these triggers helps you plan how to stay away from them.   When you cannot remove them, you can learn how to manage them.     Notes:        Plan:  Follow-up:  Patient agrees to Care Plan and Follow-up. Patient will follow up with gastroenterologist Referred to [facility] for transportation needs Patient will keep appointment with PCP RN will send update assessment to PCP RN will will follow up within the month  of June  Meet [place] Care Management [phone]

## 2020-04-05 NOTE — Telephone Encounter (Signed)
   Telephone encounter was:  Unsuccessful.  04/05/2020 Name: [name] MRN: [id number] DOB: Oct 27, 1960  Unsuccessful outbound call made today to assist with:  Transportation Needs   Outreach Attempt:  1st Attempt  A HIPAA compliant voice message was left requesting a return call.  Instructed patient to call back at [phone].  [place], Care Management  [phone] [address] , [place] [address] Email : [email or web address]

## 2020-04-06 ENCOUNTER — Telehealth: Payer: Self-pay | Admitting: *Deleted

## 2020-04-06 NOTE — Telephone Encounter (Signed)
   Telephone encounter was:  Unsuccessful.  04/06/2020 Name: [name] MRN: [id number] DOB: May 15, 1960  Unsuccessful outbound call made today to assist with:  Transportation Needs   Outreach Attempt:  2nd Attempt  A HIPAA compliant voice message was left requesting a return call.  Instructed patient to call back at Left message was mailing the application for access [place], would try her again  [place], Care Management  [phone] [address] , [place] [address] Email : [email or web address] [email or web address]

## 2020-04-07 ENCOUNTER — Telehealth: Payer: Self-pay | Admitting: *Deleted

## 2020-04-07 NOTE — Telephone Encounter (Signed)
   Telephone encounter was:  Unsuccessful.  04/07/2020 Name: [name] MRN: [id number] DOB: October 20, 1960  Unsuccessful outbound call made today to assist with:  Transportation Needs   Outreach Attempt:  2nd Attempt  A HIPAA compliant voice message was left requesting a return call.  Instructed patient to call back at Left Message - patient did not answer and I left a message that all applications have been mailed and to call back if she has any questions at [place] , [place], Care Management  [phone] [address] , [place] [address] Email : [email or web address]

## 2020-04-11 ENCOUNTER — Telehealth: Payer: Self-pay | Admitting: *Deleted

## 2020-04-11 NOTE — Telephone Encounter (Signed)
   Telephone encounter was:  Successful.  04/11/2020 Name: Brendolyn Stockley MRN: [id number] DOB: 1960/01/29  Shadee Rathod is a 60 y.o. year old female who is a primary care patient of Plotnikov, Evie Lacks, MD . The community resource team was consulted for assistance with Transportation Needs   Care guide performed the following interventions: Patient provided with information about care guide support team and interviewed to confirm resource needs Follow up call placed to community resources to determine status of patients referral.  Follow Up Plan:  Care guide will follow up with patient by phone over the next few days  [place], Care Management  [phone] [address] , [place] [address] Email : [email or web address] [email or web address]

## 2020-04-11 NOTE — Telephone Encounter (Signed)
   Telephone encounter was:  Unsuccessful.  04/11/2020 Name: Kallee Nam MRN: [id number] DOB: [birth year]  Unsuccessful outbound call made today to assist with:  Transportation Needs   Outreach Attempt:  2nd Attempt  A HIPAA compliant voice message was left requesting a return call.  Instructed patient to call back at [phone]  Lynze Reddy Greenauer -Moran Care Guide , Embedded Care Coordination [place], Care Management  [phone] [address] , [place] [address] Email : [email or web address] [email or web address]

## 2020-04-11 NOTE — Telephone Encounter (Signed)
   Telephone encounter was:  Successful.  04/11/2020 Name: [name] MRN: [id number] DOB: 09-21-1960  [name] is a 60 y.o. year old female who is a primary care patient of Plotnikov, Evie Lacks, MD . The community resource team was consulted for assistance with Transportation Needs   Care guide performed the following interventions: Patient provided with information about care guide support team and interviewed to confirm resource needs Follow up call placed to community resources to determine status of patients referral.  Follow Up Plan:  Care guide will follow up with patient by phone over the next few days    [place], Care Management  [phone] [address] , [place] [address] Email : Ashby Dawes. [email or web address]-[email or web address]

## 2020-04-13 NOTE — Telephone Encounter (Signed)
   Telephone encounter was:  Successful.  04/13/2020 Name: Toiya Morrish MRN: [id number] DOB: 1960/08/09  Hatsumi Steinhart is a 60 y.o. year old female who is a primary care patient of Plotnikov, Evie Lacks, MD . The community resource team was consulted for assistance with Transportation Needs  and [place] guide performed the following interventions: Patient provided with information about care guide support team and interviewed to confirm resource needs.  Follow Up Plan:  Care guide will follow up with patient by phone over the next days [place], Care Management  [phone] [address] , [place] [address] Email : Ashby [email or web address]. [email or web address]

## 2020-04-17 ENCOUNTER — Encounter: Payer: Self-pay | Admitting: Internal Medicine

## 2020-04-17 ENCOUNTER — Other Ambulatory Visit: Payer: Self-pay

## 2020-04-17 ENCOUNTER — Ambulatory Visit (INDEPENDENT_AMBULATORY_CARE_PROVIDER_SITE_OTHER): Payer: Medicare (Managed Care) | Admitting: Internal Medicine

## 2020-04-17 VITALS — BP 162/82 | HR 76 | Temp 98.4°F | Ht 67.0 in | Wt 257.0 lb

## 2020-04-17 DIAGNOSIS — F339 Major depressive disorder, recurrent, unspecified: Secondary | ICD-10-CM

## 2020-04-17 DIAGNOSIS — E66813 Obesity, class 3: Secondary | ICD-10-CM

## 2020-04-17 DIAGNOSIS — N182 Chronic kidney disease, stage 2 (mild): Secondary | ICD-10-CM | POA: Diagnosis not present

## 2020-04-17 DIAGNOSIS — E1122 Type 2 diabetes mellitus with diabetic chronic kidney disease: Secondary | ICD-10-CM

## 2020-04-17 DIAGNOSIS — I825Y1 Chronic embolism and thrombosis of unspecified deep veins of right proximal lower extremity: Secondary | ICD-10-CM

## 2020-04-17 DIAGNOSIS — F4321 Adjustment disorder with depressed mood: Secondary | ICD-10-CM

## 2020-04-17 DIAGNOSIS — D869 Sarcoidosis, unspecified: Secondary | ICD-10-CM

## 2020-04-17 DIAGNOSIS — E538 Deficiency of other specified B group vitamins: Secondary | ICD-10-CM

## 2020-04-17 DIAGNOSIS — F4320 Adjustment disorder, unspecified: Secondary | ICD-10-CM

## 2020-04-17 DIAGNOSIS — I2782 Chronic pulmonary embolism: Secondary | ICD-10-CM

## 2020-04-17 LAB — COMPREHENSIVE METABOLIC PANEL
ALT: 10 U/L (ref 0–35)
AST: 16 U/L (ref 0–37)
Albumin: 3.9 g/dL (ref 3.5–5.2)
Alkaline Phosphatase: 66 U/L (ref 39–117)
BUN: 18 mg/dL (ref 6–23)
CO2: 25 mEq/L (ref 19–32)
Calcium: 8.9 mg/dL (ref 8.4–10.5)
Chloride: 108 mEq/L (ref 96–112)
Creatinine, Ser: 0.95 mg/dL (ref 0.40–1.20)
GFR: 65.48 mL/min (ref >60.00)
Glucose, Bld: 111 mg/dL — ABNORMAL HIGH (ref 70–99)
Potassium: 3.9 mEq/L (ref 3.5–5.1)
Sodium: 142 mEq/L (ref 135–145)
Total Bilirubin: 0.3 mg/dL (ref 0.2–1.2)
Total Protein: 7 g/dL (ref 6.0–8.3)

## 2020-04-17 LAB — HEMOGLOBIN A1C: Hgb A1c MFr Bld: 5.6 % (ref 4.6–6.5)

## 2020-04-17 LAB — TSH: TSH: 1.2 u[IU]/mL (ref 0.35–4.50)

## 2020-04-17 NOTE — Progress Notes (Signed)
Subjective:  Patient ID: [name], female    DOB: Nov 24, 1960  Age: 60 y.o. MRN: [id number]  CC: Follow-up (6 month f/u- Requesting transformation forms to be completed)   HPI [name] presents for CAD, sarcoid, hypoxemia - on O2 [name] is grieving - brother died of colon cancer - 28 yo  Outpatient Medications Prior to Visit  Medication Sig Dispense Refill  . albuterol (VENTOLIN HFA) 108 (90 Base) MCG/ACT inhaler Inhale 2 puffs into the lungs every 6 (six) hours as needed for wheezing or shortness of breath. 8.5 g 0  . augmented betamethasone dipropionate (DIPROLENE-AF) 0.05 % ointment Apply topically 2 (two) times daily.    . B-D ULTRA-FINE 33 LANCETS MISC Use as instructed to test sugars once  daily 100 each 12  . budesonide-formoterol (SYMBICORT) 160-4.5 MCG/ACT inhaler Inhale 2 puffs into the lungs TWICE DAILY 10.2 g 5  . carvedilol (COREG) 12.5 MG tablet Take 1 tablet (12.5 mg total) by mouth 2 (two) times daily with a meal. 180 tablet 3  . Cholecalciferol (VITAMIN D3) 1.25 MG (50000 UT) CAPS Take 1 capsule by mouth every 30 (thirty) days. 3 capsule 3  . clonazePAM (KLONOPIN) 0.5 MG tablet Take 1 TO 2 tablets (0.5-1 mg total) by mouth at bedtime as needed for anxiety. 180 tablet 1  . Cyanocobalamin (VITAMIN B-12) 1000 MCG SUBL Place 1 tablet (1,000 mcg total) under the tongue daily. 100 tablet 3  . dexlansoprazole (DEXILANT) 60 MG capsule Take 1 capsule (60 mg total) by mouth daily. 90 capsule 3  . diphenoxylate-atropine (LOMOTIL) 2.5-0.025 MG tablet Take 1-2 tablets by mouth 4 (four) times daily as needed for diarrhea or loose stools. 60 tablet 0  . diphenoxylate-atropine (LOMOTIL) 2.5-0.025 MG tablet Take 1 tablet by mouth 3 (three) times daily as needed for diarrhea or loose stools. 60 tablet 3  . furosemide (LASIX) 40 MG tablet TAKE 1 TABLET BY MOUTH EVERY MORNING FOR EDEMA 90 tablet 1  . gabapentin (NEURONTIN) 300 MG capsule TAKE ONE CAPSULE BY  MOUTH EVERY EVENING, MAY TAKE A SECOND CAPSULE DURING THE DAY AS NEEDED FOR PAIN 180 capsule 1  . glucose blood (ACCU-CHEK GUIDE) test strip Use to check blood sugar once a day. 100 each 12  . HUMIRA PEN 40 MG/0.4ML PNKT Inject 40 mg as directed every Wednesday.     . hydrOXYzine (ATARAX/VISTARIL) 25 MG tablet Take 1 tablet (25 mg total) by mouth every 8 (eight) hours as needed. 60 tablet 1  . lidocaine (XYLOCAINE) 5 % ointment Apply 1 application topically as needed. For bunion/toe pain 35.44 g 0  . metFORMIN (GLUCOPHAGE-XR) 500 MG 24 hr tablet Take 2 tablets (1,000 mg total) by mouth at bedtime. 180 tablet 2  . naltrexone (DEPADE) 50 MG tablet Take 12.5 mg by mouth at bedtime.     . OXYGEN Inhale into the lungs. Takes 2 liters every day (24/7)    . potassium chloride (KLOR-CON) 10 MEQ tablet Take 1 tablet (10 mEq total) by mouth daily. 90 tablet 1  . SUPREP BOWEL PREP KIT 17.5-3.13-1.6 GM/177ML SOLN SMARTSIG:1 Kit(s) By Mouth Once 540 mL 0  . topiramate (TOPAMAX) 50 MG tablet Take 1 tablet (50 mg total) by mouth 2 (two) times daily. 180 tablet 3  . UNABLE TO FIND Use as instructed to check blood sugar once a day.    . venlafaxine XR (EFFEXOR-XR) 75 MG 24 hr capsule Take 1 capsule (75 mg total) by mouth daily with breakfast. 30 capsule  11  . XARELTO 20 MG TABS tablet Take 1 tablet (20 mg total) by mouth at bedtime. 90 tablet 3   No facility-administered medications prior to visit.    ROS: Review of Systems  Constitutional: Positive for fatigue. Negative for activity change, appetite change, chills and unexpected weight change.  HENT: Negative for congestion, mouth sores and sinus pressure.   Eyes: Negative for visual disturbance.  Respiratory: Positive for shortness of breath. Negative for cough and chest tightness.   Gastrointestinal: Negative for abdominal pain and nausea.  Genitourinary: Negative for difficulty urinating, frequency and vaginal pain.  Musculoskeletal: Positive for  arthralgias, back pain and gait problem.  Skin: Negative for pallor and rash.  Neurological: Positive for weakness. Negative for dizziness, tremors, numbness and headaches.  Psychiatric/Behavioral: Positive for sleep disturbance. Negative for confusion. The patient is nervous/anxious.     Objective:  BP (!) 162/82 (BP Location: Left Arm)   Pulse 76   Temp 98.4 F (36.9 C) (Oral)   Ht _0  (1.702 m)   Wt 257 lb (116.6 kg)   LMP 05/19/2003   SpO2 95% Comment: 63m  BMI 40.25 kg/m   BP Readings from Last 3 Encounters:  04/17/20 (!) 162/82  03/21/20 128/70  03/02/20 120/80    Wt Readings from Last 3 Encounters:  04/17/20 257 lb (116.6 kg)  03/21/20 263 lb 12.8 oz (119.7 kg)  03/02/20 263 lb 6.4 oz (119.5 kg)    Physical Exam Constitutional:      General: She is not in acute distress.    Appearance: She is well-developed.  HENT:     Head: Normocephalic.     Right Ear: External ear normal.     Left Ear: External ear normal.     Nose: Nose normal.  Eyes:     General:        Right eye: No discharge.        Left eye: No discharge.     Conjunctiva/sclera: Conjunctivae normal.     Pupils: Pupils are equal, round, and reactive to light.  Neck:     Thyroid: No thyromegaly.     Vascular: No JVD.     Trachea: No tracheal deviation.  Cardiovascular:     Rate and Rhythm: Normal rate and regular rhythm.     Heart sounds: Normal heart sounds.  Pulmonary:     Effort: No respiratory distress.     Breath sounds: No stridor. No wheezing.  Abdominal:     General: Bowel sounds are normal. There is no distension.     Palpations: Abdomen is soft. There is no mass.     Tenderness: There is no abdominal tenderness. There is no guarding or rebound.  Musculoskeletal:        General: Tenderness present.     Cervical back: Normal range of motion and neck supple.  Lymphadenopathy:     Cervical: No cervical adenopathy.  Skin:    Findings: No erythema or rash.  Neurological:     Mental  Status: She is oriented to person, place, and time.     Cranial Nerves: No cranial nerve deficit.     Motor: Weakness present. No abnormal muscle tone.     Coordination: Coordination abnormal.     Gait: Gait abnormal.     Deep Tendon Reflexes: Reflexes normal.  Psychiatric:        Behavior: Behavior normal.        Thought Content: Thought content normal.  Judgment: Judgment normal.   On O2 Using a walker tearful  Lab Results  Component Value Date   WBC 6.2 04/07/2018   HGB 11.5 (L) 04/07/2018   HCT 35.3 (L) 04/07/2018   PLT 234.0 04/07/2018   GLUCOSE 108 (H) 11/15/2019   CHOL 220 (H) 08/27/2018   TRIG 107 08/27/2018   HDL 88 08/27/2018   LDLDIRECT 107.0 06/30/2017   LDLCALC 111 (H) 08/27/2018   ALT 9 11/15/2019   AST 13 11/15/2019   NA 142 11/15/2019   K 4.0 11/15/2019   CL 109 11/15/2019   CREATININE 0.86 11/15/2019   BUN 14 11/15/2019   CO2 26 11/15/2019   TSH 3.02 11/15/2019   INR 1.00 02/11/2018   HGBA1C 6.0 11/15/2019   MICROALBUR <0.7 02/01/2015    No results found.  Assessment & Plan:   There are no diagnoses linked to this encounter.   No orders of the defined types were placed in this encounter.    Follow-up: No follow-ups on file.  Walker Kehr, MD

## 2020-04-17 NOTE — Assessment & Plan Note (Signed)
On Xarelto 

## 2020-04-17 NOTE — Assessment & Plan Note (Signed)
Metformin, Januvia

## 2020-04-17 NOTE — Assessment & Plan Note (Signed)
Loosing wt on diet/fasting

## 2020-04-17 NOTE — Assessment & Plan Note (Signed)
On B12 

## 2020-04-17 NOTE — Addendum Note (Signed)
Addended by: Boris Lown B on: 04/17/2020 11:43 AM   Modules accepted: Orders

## 2020-04-17 NOTE — Assessment & Plan Note (Signed)
[name] is grieving - brother died of colon cancer - 60 yo in 1/22 Discussed

## 2020-04-17 NOTE — Assessment & Plan Note (Signed)
Worse - due to grief

## 2020-04-17 NOTE — Assessment & Plan Note (Signed)
On O2 

## 2020-04-18 ENCOUNTER — Telehealth: Payer: Self-pay | Admitting: *Deleted

## 2020-04-18 NOTE — Telephone Encounter (Signed)
   Telephone encounter was:  Successful.  04/18/2020 Name: [name] MRN: [id number] DOB: 1960/07/13  [name] is a 60 y.o. year old female who is a primary care patient of Plotnikov, Evie Lacks, MD . The community resource team was consulted for assistance with Transportation Needs   Care guide performed the following interventions: Patient provided with information about care guide support team and interviewed to confirm resource needs Follow up call placed to the patient to discuss status of referral.  Follow Up Plan:  No further follow up planned at this time. The patient has been provided with needed resources. [place], Care Management  [phone] [address] , [place] [address] Email : [email or web address]

## 2020-04-19 NOTE — Unmapped (Signed)
[facility] Refill Coordination Note    Specialty Medication(s) to be Shipped:   Inflammatory Disorders: Humira    Other medication(s) to be shipped: No additional medications requested for fill at this time     Carolyn Newton, DOB: 12/14/1960  Phone: [phone] (home)       All above HIPAA information was verified with patient.     Was a Nurse, learning disability used for this call? No    Completed refill call assessment today to schedule patient's medication shipment from the [facility] [phone]).       Specialty medication(s) and dose(s) confirmed: Regimen is correct and unchanged.   Changes to medications: Carolyn Newton reports no changes at this time.  Changes to insurance: No  Questions for the pharmacist: No    Confirmed patient received a Conservation officer, historic buildings and a Surveyor, mining with first shipment. The patient will receive a drug information handout for each medication shipped and additional FDA Medication Guides as required.       DISEASE/MEDICATION-SPECIFIC INFORMATION        For patients on injectable medications: Patient currently has 2 doses left.  Next injection is scheduled for 04/06,04/13.    SPECIALTY MEDICATION ADHERENCE     Medication Adherence    Patient reported X missed doses in the last month: 0  Specialty Medication: humira 40mg /0.33ml  Patient is on additional specialty medications: No  Patient is on more than two specialty medications: No  Any gaps in refill history greater than 2 weeks in the last 3 months: no  Informant: patient  Reliability of informant: reliable  Provider-estimated medication adherence level: good  Patient is at risk for Non-Adherence: No                HUMIRA 40/0.4 mg/ml: 14 days of medicine on hand         SHIPPING     Shipping address confirmed in Epic.     Delivery Scheduled: Yes, Expected medication delivery date: 04/14.     Medication will be delivered via UPS to the prescription address in Epic WAM.    [name]   [facility] Specialty Technician

## 2020-04-20 ENCOUNTER — Ambulatory Visit: Payer: Medicare Other | Admitting: Sports Medicine

## 2020-04-25 ENCOUNTER — Telehealth (INDEPENDENT_AMBULATORY_CARE_PROVIDER_SITE_OTHER): Payer: Medicare (Managed Care) | Admitting: Internal Medicine

## 2020-04-25 DIAGNOSIS — J0101 Acute recurrent maxillary sinusitis: Secondary | ICD-10-CM | POA: Diagnosis not present

## 2020-04-25 DIAGNOSIS — D869 Sarcoidosis, unspecified: Principal | ICD-10-CM

## 2020-04-25 MED ORDER — METHYLPREDNISOLONE 4 MG PO TBPK
ORAL_TABLET | ORAL | 0 refills | Status: DC
Start: 2020-04-25 — End: 2020-05-15

## 2020-04-25 MED ORDER — CEFUROXIME AXETIL 250 MG PO TABS: 250.0000 mg | ORAL_TABLET | Freq: Two times a day (BID) | ORAL | 0 refills | Status: DC

## 2020-04-25 MED ORDER — BENZONATATE 200 MG PO CAPS: 200.0000 mg | ORAL_CAPSULE | Freq: Three times a day (TID) | ORAL | 0 refills | Status: DC | PRN

## 2020-04-25 NOTE — Assessment & Plan Note (Signed)
URI with sinusitis.  The patient was instructed to do a Covid test at home.  Prescribed Ceftin, Medrol pack, Tessalon.  Call if problems

## 2020-04-25 NOTE — Progress Notes (Signed)
Virtual Visit via Telephone Note  I connected with [name] on 04/25/20 at 10:00 AM EDT by telephone and verified that I am speaking with the correct person using two identifiers.  Location: Patient: Home Provider:[facility]   I discussed the limitations, risks, security and privacy concerns of performing an evaluation and management service by telephone and the availability of in person appointments. I also discussed with the patient that there may be a patient responsible charge related to this service. The patient expressed understanding and agreed to proceed.   History of Present Illness: C/o cough and congestion since Wednesday last week.  The cough has been productive.  Mucinex is not helping.  The sputum is green.  Complaining of a headache with sinus congestion   Observations/Objective:  The patient sounds nasally on the phone Assessment and Plan:  See plan Follow Up Instructions:    I discussed the assessment and treatment plan with the patient. The patient was provided an opportunity to ask questions and all were answered. The patient agreed with the plan and demonstrated an understanding of the instructions.   The patient was advised to call back or seek an in-person evaluation if the symptoms worsen or if the condition fails to improve as anticipated.  I provided 15 minutes of non-face-to-face time during this encounter.   Walker Kehr, MD

## 2020-04-26 MED FILL — HUMIRA PEN CITRATE FREE 40 MG/0.4 ML: SUBCUTANEOUS | 28 days supply | Qty: 4 | Fill #11

## 2020-05-11 ENCOUNTER — Ambulatory Visit: Payer: Medicare (Managed Care) | Admitting: Adult Health

## 2020-05-15 ENCOUNTER — Ambulatory Visit (INDEPENDENT_AMBULATORY_CARE_PROVIDER_SITE_OTHER): Payer: 59 | Admitting: Adult Health

## 2020-05-15 ENCOUNTER — Other Ambulatory Visit: Payer: Self-pay

## 2020-05-15 ENCOUNTER — Ambulatory Visit (INDEPENDENT_AMBULATORY_CARE_PROVIDER_SITE_OTHER): Payer: 59

## 2020-05-15 ENCOUNTER — Encounter: Payer: Self-pay | Admitting: Adult Health

## 2020-05-15 VITALS — BP 120/80 | HR 72 | Temp 97.6°F | Ht 67.0 in | Wt 248.8 lb

## 2020-05-15 DIAGNOSIS — J441 Chronic obstructive pulmonary disease with (acute) exacerbation: Secondary | ICD-10-CM | POA: Diagnosis not present

## 2020-05-15 DIAGNOSIS — J440 Chronic obstructive pulmonary disease with acute lower respiratory infection: Secondary | ICD-10-CM

## 2020-05-15 DIAGNOSIS — J209 Acute bronchitis, unspecified: Secondary | ICD-10-CM

## 2020-05-15 DIAGNOSIS — J9611 Chronic respiratory failure with hypoxia: Secondary | ICD-10-CM | POA: Diagnosis not present

## 2020-05-15 MED ORDER — AMOXICILLIN-POT CLAVULANATE 875-125 MG PO TABS: 1.0000 | ORAL_TABLET | Freq: Two times a day (BID) | ORAL | 0 refills | Status: AC

## 2020-05-15 MED ORDER — PREDNISONE 20 MG PO TABS: 20.0000 mg | ORAL_TABLET | Freq: Every day | ORAL | 0 refills | Status: DC

## 2020-05-15 NOTE — Patient Instructions (Addendum)
Chest xray today  Augmentin 875mg  Twice daily  For 1 week.  Prednisone 20mg  daily for 5 days  Saline nasal rinses As needed   Continue on Symbicort Twice daily  . Rinse after use.  Mucinex DM Twice daily  As needed  Cough/congestion  Continue on Oxygen 2l/m .  Activity as tolerated.  Continue on Xarelto .  Work on healthy weight loss.  Follow up Dr. Elsworth Soho in 2-3   months and As needed   Please contact office for sooner follow up if symptoms do not improve or worsen or seek emergency care

## 2020-05-15 NOTE — Assessment & Plan Note (Signed)
Slow to resolve exacerbation  Check cxr today .   Plan  Patient Instructions  Chest xray today  Augmentin 875mg  Twice daily  For 1 week.  Prednisone 20mg  daily for 5 days  Saline nasal rinses As needed   Continue on Symbicort Twice daily  . Rinse after use.  Mucinex DM Twice daily  As needed  Cough/congestion  Continue on Oxygen 2l/m .  Activity as tolerated.  Continue on Xarelto .  Work on healthy weight loss.  Follow up Dr. Elsworth Soho in 2-3   months and As needed   Please contact office for sooner follow up if symptoms do not improve or worsen or seek emergency care

## 2020-05-15 NOTE — Assessment & Plan Note (Signed)
Cont on O2   Plan  Patient Instructions  Chest xray today  Augmentin 875mg  Twice daily  For 1 week.  Prednisone 20mg  daily for 5 days  Saline nasal rinses As needed   Continue on Symbicort Twice daily  . Rinse after use.  Mucinex DM Twice daily  As needed  Cough/congestion  Continue on Oxygen 2l/m .  Activity as tolerated.  Continue on Xarelto .  Work on healthy weight loss.  Follow up Dr. Elsworth Soho in 2-3   months and As needed   Please contact office for sooner follow up if symptoms do not improve or worsen or seek emergency care

## 2020-05-15 NOTE — Progress Notes (Signed)
_0  ID: [name], female    DOB: May 26, 1960, 60 y.o.   MRN: [id number]  Chief Complaint  Patient presents with  . 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 She has chronic respiratory failure on Oxygen on 2l/m.  Has OSA , CPAP intolerant.    TEST/EVENTS :  2018 started on Humira for reoccurring cutaneous lesions not responding to methotrexate. Previously on prednisone but tapered off.  Admitted 07/21/2016 for RLE DVT-had stoppedXarelto 04/2016 for prior pulmonary embolism  CT scan = chronic appearing subsegmental PE ,on Xarelto lifelong  PFT 05/2014 >> FVC 65%, ratio 69, FEV1 58%, DLCO 62%  VQ Scan 08/17/15 >>LLL &RUL acute PE   ECHO 08/18/15 >>mild LVH , EF 55-60%, Gr 1 DD .   LE Venous Doppler 08/18/15 >>neg DVT , no bakers cyst, ?prominent inguinal lymph node measuring 3.1cm   MRI brain neg [facility] = MRI spine = mild to moderate disc protrusion/left-sided spinal stenosis-chronic pain  NPSG 12/2004 mild, RDI 13/h  SARS-CoV-2 July 23, 2018- negative  CT chest July 2020 showed stable sarcoid to mildly decreased.(Nodular opacities (stable mediastinal and bilateral hilar adenopathy  decreased.(Nodular opacities (stable mediastinal and bilateral hilar adenopathy. PFTs done in July 2020 showed stable lung function with FEV1 at 70%, ratio 81, FVC 67% and DLCO was 61%. This was similar to 2016   05/15/2020 Follow up : O2 RF , Sarcoid, cough  Patient returns for a 88-monthfollow-up.  Patient says over the last 4 weeks that she has had increased cough, congestion nasal discharge with green mucus.  She was seen by her primary care provider.  COVID home testing was negative.  She was given Ceftin and Medrol dose  pack with only minimal improvement.  Says her appetite has been fair with no vomiting or diarrhea.  She is had no hemoptysis chest pain orthopnea or increased leg swelling.  She says she continues to have ongoing cough and congestion and is worried that she still has infection. She remains on Symbicort twice daily.  She is on oxygen 2 L with no increased oxygen demands.  She is maintained on Xarelto.  She denies any hemoptysis. She says her activity tolerance remains low.  And she feels fatigued.   Allergies  Allergen Reactions  . Belsomra [Suvorexant] Other (See Comments)    FGolden Circleout of bed while asleep: "I'm waking up as I'm falling on the floor;" "Night terrors"  . Enalapril Maleate Cough  . Latex Hives  . Nickel Other (See Comments)    Blisters  Pt has a titanium right and left knee - nickel causes skin irritations that form into blisters and sores  . Other Other (See Comments)    Patient has Sarcoidosis and can't tolerate any metals  . Morphine And Related Itching and Nausea Only  . Doxycycline Diarrhea and Nausea And Vomiting  . Hydrochlorothiazide Other (See Comments)    Low potassium levels   . Hydroxychloroquine Sulfate Other (See Comments)    Vision changes   . Lyrica [Pregabalin] Other (See Comments)    "Made me feel high"  . Tape Other (See Comments)    Medical tape causes bruising    Immunization History  Administered Date(s) Administered  . H1N1 12/24/2007  . Influenza Split 10/11/2010  .  Influenza Whole 10/14/2007, 10/05/2008, 10/26/2009, 11/13/2011  . Influenza,inj,Quad PF,6+ Mos 09/21/2012, 09/24/2013, 10/06/2014, 09/05/2015, 11/06/2016, 09/24/2017, 10/07/2018, 11/15/2019  . PFIZER(Purple Top)SARS-COV-2 Vaccination 03/31/2019, 04/21/2019, 11/27/2019  . PPD Test 11/18/2014  . Pneumococcal Conjugate-13 07/30/2010, 12/29/2012  . Pneumococcal Polysaccharide-23 12/23/2013  . Tdap 06/28/2014    Past Medical History:  Diagnosis Date  . Allergic rhinitis   .  ALLERGIC RHINITIS 10/26/2009  . Anxiety   . ANXIETY 08/14/2006  . B12 DEFICIENCY 08/25/2007  . Complication of anesthesia    pt has had difficulty following anesthesia with her knee in 2016-unable to care for herself afterward  . Confusion   . Depression    takes Lexapro daily  . Diabetes mellitus without complication ([place])    was on insulin but has been off since Nov 2015 and now only takes Metformin daily  . DYSPNEA 04/28/2009   with exertion  . Esophageal reflux    takes Nexium daily  . Fibromyalgia   . Headache    last migraine 2-77yr ago;takes Topamax daily  . History of shingles   . Hypertension    takes Coreg daily  . Insomnia    takes Nortriptyline nightly   . Joint pain   . Joint swelling   . Left knee pain   . Lichen planus   . Long-term memory loss   . Nocturia   . OSA (obstructive sleep apnea)    doesn't use CPAP;sleep study in epic from 2006  . Osteoarthritis   . Osteoarthritis   . Pneumonia    over 30 yrs ago  . PONV (postoperative nausea and vomiting)   . Protein calorie malnutrition ([place]   . Rheumatoid arthritis ([place]   . Sarcoidosis    Dr. ZLolita Patella . Short-term memory loss   . Shortness of breath   . Sleep apnea     wears oxygen  . TIA (transient ischemic attack)   . Unsteady gait   . Urinary urgency   . Vitamin D deficiency    is supposed to take Vit D but can't afford it  . VITAMIN D DEFICIENCY 08/25/2007    Tobacco History: Social History   Tobacco Use  Smoking Status Former Smoker  . Packs/day: 0.50  . Years: 10.00  . Pack years: 5.00  . Types: Cigarettes  Smokeless Tobacco Never Used  Tobacco Comment   quit smoking in 2004   Counseling given: Not Answered Comment: quit smoking in 2004   Outpatient Medications Prior to Visit  Medication Sig Dispense Refill  . albuterol (VENTOLIN HFA) 108 (90 Base) MCG/ACT inhaler Inhale 2 puffs into the lungs every 6 (six) hours as needed for wheezing or shortness of breath. 8.5 g 0  .  augmented betamethasone dipropionate (DIPROLENE-AF) 0.05 % ointment Apply topically 2 (two) times daily.    . B-D ULTRA-FINE 33 LANCETS MISC Use as instructed to test sugars once  daily 100 each 12  . benzonatate (TESSALON) 200 MG capsule Take 1 capsule (200 mg total) by mouth 3 (three) times daily as needed for cough. 30 capsule 0  . budesonide-formoterol (SYMBICORT) 160-4.5 MCG/ACT inhaler Inhale 2 puffs into the lungs TWICE DAILY 10.2 g 5  . carvedilol (COREG) 12.5 MG tablet Take 1 tablet (12.5 mg total) by mouth 2 (two) times daily with a meal. 180 tablet 3  . Cholecalciferol (VITAMIN D3) 1.25 MG (50000 UT) CAPS Take 1 capsule by mouth every 30 (thirty) days. 3 capsule 3  . clonazePAM (KLONOPIN) 0.5 MG tablet Take 1 TO 2  tablets (0.5-1 mg total) by mouth at bedtime as needed for anxiety. 180 tablet 1  . Cyanocobalamin (VITAMIN B-12) 1000 MCG SUBL Place 1 tablet (1,000 mcg total) under the tongue daily. 100 tablet 3  . dexlansoprazole (DEXILANT) 60 MG capsule Take 1 capsule (60 mg total) by mouth daily. 90 capsule 3  . diphenoxylate-atropine (LOMOTIL) 2.5-0.025 MG tablet Take 1-2 tablets by mouth 4 (four) times daily as needed for diarrhea or loose stools. 60 tablet 0  . diphenoxylate-atropine (LOMOTIL) 2.5-0.025 MG tablet Take 1 tablet by mouth 3 (three) times daily as needed for diarrhea or loose stools. 60 tablet 3  . furosemide (LASIX) 40 MG tablet TAKE 1 TABLET BY MOUTH EVERY MORNING FOR EDEMA 90 tablet 1  . gabapentin (NEURONTIN) 300 MG capsule TAKE ONE CAPSULE BY MOUTH EVERY EVENING, MAY TAKE A SECOND CAPSULE DURING THE DAY AS NEEDED FOR PAIN 180 capsule 1  . glucose blood (ACCU-CHEK GUIDE) test strip Use to check blood sugar once a day. 100 each 12  . HUMIRA PEN 40 MG/0.4ML PNKT Inject 40 mg as directed every Wednesday.     . hydrOXYzine (ATARAX/VISTARIL) 25 MG tablet Take 1 tablet (25 mg total) by mouth every 8 (eight) hours as needed. 60 tablet 1  . lidocaine (XYLOCAINE) 5 % ointment  Apply 1 application topically as needed. For bunion/toe pain 35.44 g 0  . metFORMIN (GLUCOPHAGE-XR) 500 MG 24 hr tablet Take 2 tablets (1,000 mg total) by mouth at bedtime. 180 tablet 2  . methylPREDNISolone (MEDROL DOSEPAK) 4 MG TBPK tablet As directed 21 tablet 0  . Multiple Vitamin (MULTIVITAMIN WITH MINERALS) TABS tablet Take 1 tablet by mouth daily.    . naltrexone (DEPADE) 50 MG tablet Take 12.5 mg by mouth at bedtime.     . OXYGEN Inhale into the lungs. Takes 2 liters every day (24/7)    . potassium chloride (KLOR-CON) 10 MEQ tablet Take 1 tablet (10 mEq total) by mouth daily. 90 tablet 1  . SUPREP BOWEL PREP KIT 17.5-3.13-1.6 GM/177ML SOLN SMARTSIG:1 Kit(s) By Mouth Once 540 mL 0  . topiramate (TOPAMAX) 50 MG tablet Take 1 tablet (50 mg total) by mouth 2 (two) times daily. 180 tablet 3  . UNABLE TO FIND Use as instructed to check blood sugar once a day.    . venlafaxine XR (EFFEXOR-XR) 75 MG 24 hr capsule Take 1 capsule (75 mg total) by mouth daily with breakfast. 30 capsule 11  . XARELTO 20 MG TABS tablet Take 1 tablet (20 mg total) by mouth at bedtime. 90 tablet 3  . cefUROXime (CEFTIN) 250 MG tablet Take 1 tablet (250 mg total) by mouth 2 (two) times daily with a meal. (Patient not taking: Reported on 05/15/2020) 20 tablet 0   No facility-administered medications prior to visit.     Review of Systems:   Constitutional:   No  weight loss, night sweats,  Fevers, chills,  +fatigue, or  lassitude.  HEENT:   No headaches,  Difficulty swallowing,  Tooth/dental problems, or  Sore throat,                No sneezing, itching, ear ache, + nasal congestion, post nasal drip,   CV:  No chest pain,  Orthopnea, PND, swelling in lower extremities, anasarca, dizziness, palpitations, syncope.   GI  No heartburn, indigestion, abdominal pain, nausea, vomiting, diarrhea, change in bowel habits, loss of appetite, bloody stools.   Resp:    No chest wall deformity  Skin: no rash  or lesions.  GU:  no dysuria, change in color of urine, no urgency or frequency.  No flank pain, no hematuria   MS:  No joint pain or swelling.  No decreased range of motion.  No back pain.    Physical Exam  BP 120/80 (BP Location: Left Arm, Patient Position: Sitting, Cuff Size: Large)   Pulse 72   Temp 97.6 F (36.4 C) (Temporal)   Ht _0  (1.702 m)   Wt 248 lb 12.8 oz (112.9 kg)   LMP 05/19/2003   SpO2 93%   BMI 38.97 kg/m   GEN: A/Ox3; pleasant , NAD, BMI 38, on oxygen   HEENT:  [place]/AT,   NOSE-clear, THROAT-clear, no lesions, no postnasal drip or exudate noted.   NECK:  Supple w/ fair ROM; no JVD; normal carotid impulses w/o bruits; no thyromegaly or nodules palpated; no lymphadenopathy.    RESP few trace rhonchi  no accessory muscle use, no dullness to percussion  CARD:  RRR, no m/r/g, tr peripheral edema, pulses intact, no cyanosis or clubbing.  GI:   Soft & nt; nml bowel sounds; no organomegaly or masses detected.   Musco: Warm bil, no deformities or joint swelling noted.   Neuro: alert, no focal deficits noted.    Skin: Warm, no lesions or rashes    Lab Results:     Imaging: No results found.    PFT Results Latest Ref Rng & Units 07/27/2018 12/30/2016 05/18/2014  FVC-Pre L 2.51 2.07 2.29  FVC-Predicted Pre % 67 54 61  FVC-Post L 2.51 2.18 2.43  FVC-Predicted Post % 67 57 65  Pre FEV1/FVC % % 79 70 69  Post FEV1/FCV % % 81 68 70  FEV1-Pre L 1.99 1.46 1.58  FEV1-Predicted Pre % 68 49 54  FEV1-Post L 2.03 1.49 1.70  DLCO uncorrected ml/min/mmHg 13.89 11.74 16.78  DLCO UNC% % 61 41 62  DLCO corrected ml/min/mmHg - 11.34 -  DLCO COR %Predicted % - 40 -  DLVA Predicted % 84 66 91  TLC L 4.27 4.07 -  TLC % Predicted % 77 74 -  RV % Predicted % 84 92 -    No results found for: NITRICOXIDE      Assessment & Plan:   No problem-specific Assessment & Plan notes found for this encounter.     Rexene Edison, NP 05/15/2020

## 2020-05-16 NOTE — Assessment & Plan Note (Signed)
Slow to resolve acute bronchitis Check xray today .  With tx with additional antibiotics and steroids    Plan  Patient Instructions  Chest xray today  Augmentin 875mg  Twice daily  For 1 week.  Prednisone 20mg  daily for 5 days  Saline nasal rinses As needed   Continue on Symbicort Twice daily  . Rinse after use.  Mucinex DM Twice daily  As needed  Cough/congestion  Continue on Oxygen 2l/m .  Activity as tolerated.  Continue on Xarelto .  Work on healthy weight loss.  Follow up Dr. Elsworth Soho in 2-3   months and As needed   Please contact office for sooner follow up if symptoms do not improve or worsen or seek emergency care

## 2020-05-18 ENCOUNTER — Ambulatory Visit (INDEPENDENT_AMBULATORY_CARE_PROVIDER_SITE_OTHER): Payer: 59 | Admitting: Sports Medicine

## 2020-05-18 ENCOUNTER — Other Ambulatory Visit: Payer: Self-pay

## 2020-05-18 ENCOUNTER — Encounter: Payer: Self-pay | Admitting: Sports Medicine

## 2020-05-18 DIAGNOSIS — E1142 Type 2 diabetes mellitus with diabetic polyneuropathy: Secondary | ICD-10-CM

## 2020-05-18 DIAGNOSIS — B351 Tinea unguium: Secondary | ICD-10-CM

## 2020-05-18 DIAGNOSIS — M79675 Pain in left toe(s): Secondary | ICD-10-CM | POA: Diagnosis not present

## 2020-05-18 DIAGNOSIS — M2042 Other hammer toe(s) (acquired), left foot: Secondary | ICD-10-CM

## 2020-05-18 DIAGNOSIS — M79672 Pain in left foot: Secondary | ICD-10-CM

## 2020-05-18 DIAGNOSIS — M2041 Other hammer toe(s) (acquired), right foot: Secondary | ICD-10-CM

## 2020-05-18 DIAGNOSIS — M79674 Pain in right toe(s): Secondary | ICD-10-CM | POA: Diagnosis not present

## 2020-05-18 DIAGNOSIS — M79671 Pain in right foot: Secondary | ICD-10-CM

## 2020-05-18 DIAGNOSIS — M21619 Bunion of unspecified foot: Secondary | ICD-10-CM

## 2020-05-18 DIAGNOSIS — L439 Lichen planus, unspecified: Secondary | ICD-10-CM

## 2020-05-18 DIAGNOSIS — D869 Sarcoidosis, unspecified: Principal | ICD-10-CM

## 2020-05-18 MED ORDER — HUMIRA PEN CITRATE FREE 40 MG/0.4 ML
SUBCUTANEOUS | 11 refills | 28 days | Status: CP
Start: 2020-05-18 — End: ?
  Filled 2020-05-24: qty 4, 28d supply, fill #0

## 2020-05-18 NOTE — Unmapped (Signed)
Refill request for ADALIMUMAB PEN CITRATE FREE 40 MG/0.4 ML.     LOV: 02/07/2020    Medication pended for your review

## 2020-05-18 NOTE — Progress Notes (Signed)
Subjective: [name] is a 60 y.o. female patient with history of diabetes who presents to office today complaining of long,mildly painful nails while ambulating in shoes; unable to trim. No other issues noted.   FBS not recorded Last A1c 5.7  Last PCP visit 1 month ago  Patient Active Problem List   Diagnosis Date Noted  . Benign neoplasm of ascending colon   . Benign neoplasm of descending colon   . Benign neoplasm of sigmoid colon   . Chronic diarrhea   . Diarrhea 07/06/2019  . Shoulder pain, bilateral 02/01/2019  . Physical deconditioning 01/28/2019  . Chronic anticoagulation 11/19/2018  . Insulin dependent diabetes mellitus type IA ([place]) 11/19/2018  . Oxygen dependent 11/19/2018  . Family history of colon cancer 10/26/2018  . Myringotomy tube status 08/21/2018  . Vaginal candidiasis 07/15/2018  . Nasal crusting 06/12/2018  . Chronic serous otitis media of left ear 06/04/2018  . Mixed conductive and sensorineural hearing loss of left ear with restricted hearing of right ear 06/04/2018  . Chronic cholecystitis with calculus 02/11/2018  . Thrush, oral 01/15/2018  . Nasal sinus congestion 01/15/2018  . Dyslipidemia 10/28/2017  . Pruritus 10/23/2017  . Abnormal liver function test   . Anemia 09/24/2017  . Migraines 09/17/2017  . Cholecystitis, acute 09/12/2017  . COPD (chronic obstructive pulmonary disease) ([place]) 12/30/2016  . Diastolic dysfunction 71/24/5809  . OSA (obstructive sleep apnea) 09/30/2016  . Chronic respiratory failure with hypoxia ([place]) 08/23/2016  . Pulmonary embolism ([place]) 07/21/2016  . DVT (deep venous thrombosis) ([place]) 07/21/2016  . Acute bronchitis 01/30/2016  . Weight gain 12/20/2015  . Controlled type 2 diabetes mellitus with chronic kidney disease, without long-term current use of insulin ([place]) 02/01/2015  . Abnormal SPEP 06/04/2013  . Memory loss 04/27/2013  . Panic attacks 03/17/2013  . Rash 10/27/2012  . Pulmonary nodules  07/20/2012  . CAD in native artery 05/28/2012  . Seborrheic dermatitis 01/20/2012  . Obesity, Class III, BMI 40-49.9 (morbid obesity) ([place]) 03/07/2010  . INSOMNIA, CHRONIC 03/07/2010  . Allergic rhinitis 10/26/2009  . Grief reaction 08/01/2009  . Essential hypertension 08/27/2007  . B12 deficiency 08/25/2007  . Vitamin D deficiency 08/25/2007  . Urinary tract infection 02/07/2007  . SINUSITIS, ACUTE 02/02/2007  . Osteoarthritis 02/02/2007  . Sarcoidosis 08/14/2006  . Anxiety state 08/14/2006  . Major depressive disorder, recurrent episode ([place]) 08/14/2006  . GERD 08/14/2006   Current Outpatient Medications on File Prior to Visit  Medication Sig Dispense Refill  . albuterol (VENTOLIN HFA) 108 (90 Base) MCG/ACT inhaler Inhale 2 puffs into the lungs every 6 (six) hours as needed for wheezing or shortness of breath. 8.5 g 0  . amoxicillin-clavulanate (AUGMENTIN) 875-125 MG tablet Take 1 tablet by mouth 2 (two) times daily for 7 days. 14 tablet 0  . augmented betamethasone dipropionate (DIPROLENE-AF) 0.05 % ointment Apply topically 2 (two) times daily.    . B-D ULTRA-FINE 33 LANCETS MISC Use as instructed to test sugars once  daily 100 each 12  . benzonatate (TESSALON) 200 MG capsule Take 1 capsule (200 mg total) by mouth 3 (three) times daily as needed for cough. 30 capsule 0  . budesonide-formoterol (SYMBICORT) 160-4.5 MCG/ACT inhaler Inhale 2 puffs into the lungs TWICE DAILY 10.2 g 5  . carvedilol (COREG) 12.5 MG tablet Take 1 tablet (12.5 mg total) by mouth 2 (two) times daily with a meal. 180 tablet 3  . Cholecalciferol (VITAMIN D3) 1.25 MG (50000 UT) CAPS Take 1 capsule by mouth every  30 (thirty) days. 3 capsule 3  . clonazePAM (KLONOPIN) 0.5 MG tablet Take 1 TO 2 tablets (0.5-1 mg total) by mouth at bedtime as needed for anxiety. 180 tablet 1  . Cyanocobalamin (VITAMIN B-12) 1000 MCG SUBL Place 1 tablet (1,000 mcg total) under the tongue daily. 100 tablet 3  . dexlansoprazole  (DEXILANT) 60 MG capsule Take 1 capsule (60 mg total) by mouth daily. 90 capsule 3  . diphenoxylate-atropine (LOMOTIL) 2.5-0.025 MG tablet Take 1-2 tablets by mouth 4 (four) times daily as needed for diarrhea or loose stools. 60 tablet 0  . diphenoxylate-atropine (LOMOTIL) 2.5-0.025 MG tablet Take 1 tablet by mouth 3 (three) times daily as needed for diarrhea or loose stools. 60 tablet 3  . furosemide (LASIX) 40 MG tablet TAKE 1 TABLET BY MOUTH EVERY MORNING FOR EDEMA 90 tablet 1  . gabapentin (NEURONTIN) 300 MG capsule TAKE ONE CAPSULE BY MOUTH EVERY EVENING, MAY TAKE A SECOND CAPSULE DURING THE DAY AS NEEDED FOR PAIN 180 capsule 1  . glucose blood (ACCU-CHEK GUIDE) test strip Use to check blood sugar once a day. 100 each 12  . HUMIRA PEN 40 MG/0.4ML PNKT Inject 40 mg as directed every Wednesday.     . hydrOXYzine (ATARAX/VISTARIL) 25 MG tablet Take 1 tablet (25 mg total) by mouth every 8 (eight) hours as needed. 60 tablet 1  . lidocaine (XYLOCAINE) 5 % ointment Apply 1 application topically as needed. For bunion/toe pain 35.44 g 0  . metFORMIN (GLUCOPHAGE-XR) 500 MG 24 hr tablet Take 2 tablets (1,000 mg total) by mouth at bedtime. 180 tablet 2  . Multiple Vitamin (MULTIVITAMIN WITH MINERALS) TABS tablet Take 1 tablet by mouth daily.    . naltrexone (DEPADE) 50 MG tablet Take 12.5 mg by mouth at bedtime.     . OXYGEN Inhale into the lungs. Takes 2 liters every day (24/7)    . potassium chloride (KLOR-CON) 10 MEQ tablet Take 1 tablet (10 mEq total) by mouth daily. 90 tablet 1  . predniSONE (DELTASONE) 20 MG tablet Take 1 tablet (20 mg total) by mouth daily with breakfast. 5 tablet 0  . SUPREP BOWEL PREP KIT 17.5-3.13-1.6 GM/177ML SOLN SMARTSIG:1 Kit(s) By Mouth Once 540 mL 0  . topiramate (TOPAMAX) 50 MG tablet Take 1 tablet (50 mg total) by mouth 2 (two) times daily. 180 tablet 3  . UNABLE TO FIND Use as instructed to check blood sugar once a day.    . venlafaxine XR (EFFEXOR-XR) 75 MG 24 hr  capsule Take 1 capsule (75 mg total) by mouth daily with breakfast. 30 capsule 11  . XARELTO 20 MG TABS tablet Take 1 tablet (20 mg total) by mouth at bedtime. 90 tablet 3   No current facility-administered medications on file prior to visit.   Allergies  Allergen Reactions  . Belsomra [Suvorexant] Other (See Comments)    [place] out of bed while asleep: "I'm waking up as I'm falling on the floor;" "Night terrors"  . Enalapril Maleate Cough  . Latex Hives  . Nickel Other (See Comments)    Blisters  Pt has a titanium right and left knee - nickel causes skin irritations that form into blisters and sores  . Other Other (See Comments)    Patient has Sarcoidosis and can't tolerate any metals  . Morphine And Related Itching and Nausea Only  . Doxycycline Diarrhea and Nausea And Vomiting  . Hydrochlorothiazide Other (See Comments)    Low potassium levels   . Hydroxychloroquine Sulfate Other (  See Comments)    Vision changes   . Lyrica [Pregabalin] Other (See Comments)    "Made me feel high"  . Tape Other (See Comments)    Medical tape causes bruising    Recent Results (from the past 2160 hour(s))  TSH     Status: None   Collection Time: 04/17/20 11:43 AM  Result Value Ref Range   TSH 1.20 0.35 - 4.50 uIU/mL  Comprehensive metabolic panel     Status: Abnormal   Collection Time: 04/17/20 11:43 AM  Result Value Ref Range   Sodium 142 135 - 145 mEq/L   Potassium 3.9 3.5 - 5.1 mEq/L   Chloride 108 96 - 112 mEq/L   CO2 25 19 - 32 mEq/L   Glucose, Bld 111 (H) 70 - 99 mg/dL   BUN 18 6 - 23 mg/dL   Creatinine, Ser 0.95 0.40 - 1.20 mg/dL   Total Bilirubin 0.3 0.2 - 1.2 mg/dL   Alkaline Phosphatase 66 39 - 117 U/L   AST 16 0 - 37 U/L   ALT 10 0 - 35 U/L   Total Protein 7.0 6.0 - 8.3 g/dL   Albumin 3.9 3.5 - 5.2 g/dL   GFR 65.48 >60.00 mL/min    Comment: Calculated using the CKD-EPI Creatinine Equation (2021)   Calcium 8.9 8.4 - 10.5 mg/dL  Hemoglobin A1c     Status: None   Collection  Time: 04/17/20 11:43 AM  Result Value Ref Range   Hgb A1c MFr Bld 5.6 4.6 - 6.5 %    Comment: Glycemic Control Guidelines for People with Diabetes:Non Diabetic:  <6%Goal of Therapy: <7%Additional Action Suggested:  >8%     Objective: General: Patient is awake, alert, and oriented x 3 and in no acute distress.  Integument: Skin is warm, dry and supple bilateral. Nails are tender, long, thickened and dystrophic with subungual debris, consistent with onychomycosis, 1-5 bilateral. No signs of infection. No open lesions or preulcerative lesions present bilateral. Small hyperpigmented patches bilateral, hx of lichen planus unchanged. Remaining integument unremarkable.  Vasculature:  Dorsalis Pedis pulse 1/4 bilateral. Posterior Tibial pulse  0/4 bilateral. Capillary fill time <3 sec 1-5 bilateral. Positive hair growth to the level of the digits. Temperature gradient within normal limits. No varicosities present bilateral. Trace edema present bilateral.   Neurology: The patient has diminished sensation measured with a 5.07/10g Semmes Weinstein Monofilament at all pedal sites bilateral . Vibratory sensation diminished bilateral with tuning fork. No Babinski sign present bilateral.   Musculoskeletal: Asymptomatic pes planus and hammertoe pedal deformities noted bilateral. Chronic toe pain R>L and heel pain bilateral. Muscular strength 5/5 in all lower extremity muscular groups bilateral without pain on range of motion. No tenderness with calf compression bilateral.  Assessment and Plan: Problem List Items Addressed This Visit   None   Visit Diagnoses    Pain due to onychomycosis of toenails of both feet    -  Primary   Diabetic polyneuropathy associated with type 2 diabetes mellitus (HCC)       Lichen planus       Pain in both feet       Bunion       Hammer toes of both feet         -Examined patient. -Discussed the importance of daily inspection in the setting of diabetes -Mechanically  debrided all nails 1-5 bilateral using sterile nail nipper and filed with dremel without incident  -Continue with diabetic shoes; diabetic shoe orders form completed for  patient to be remeasured for new shoes for the year -Advised patient gentle stretching for foot pain -Advised patient to continue with bunion cushions patient is not a candidate for surgery due to chronic medical illnesses/comorbidities -Answered all patient questions -Patient to return  in 3 months for at risk foot care -Patient advised to call the office if any problems or questions arise in the meantime.  Landis Martins, DPM

## 2020-05-19 ENCOUNTER — Telehealth: Payer: Self-pay | Admitting: Internal Medicine

## 2020-05-19 LAB — HM DIABETES EYE EXAM

## 2020-05-19 NOTE — Telephone Encounter (Signed)
Patient called and said that she had filed a Tourist information centre manager with [facility] because of a bill that was not paid by them. She said that she wanted to let Dr. Alain Marion know. She said that she is sorry. She said that she was under the impression that Dr. Alain Marion was covered under her previous insurance of [facility].

## 2020-05-23 ENCOUNTER — Other Ambulatory Visit: Payer: Self-pay | Admitting: Internal Medicine

## 2020-05-23 NOTE — Unmapped (Signed)
[facility] Refill Coordination Note    Specialty Medication(s) to be Shipped:   Inflammatory Disorders: Humira    Other medication(s) to be shipped: No additional medications requested for fill at this time     Carolyn Newton, DOB: [birth year]  Phone: [phone] (home)       All above HIPAA information was verified with patient.     Was a Nurse, learning disability used for this call? No    Completed refill call assessment today to schedule patient's medication shipment from the [facility] [phone]).  All relevant notes have been reviewed.     Specialty medication(s) and dose(s) confirmed: Regimen is correct and unchanged.   Changes to medications: Carolyn Newton reports no changes at this time.  Changes to insurance: No  New side effects reported not previously addressed with a pharmacist or physician: None reported  Questions for the pharmacist: No    Confirmed patient received a Conservation officer, historic buildings and a Surveyor, mining with first shipment. The patient will receive a drug information handout for each medication shipped and additional FDA Medication Guides as required.       DISEASE/MEDICATION-SPECIFIC INFORMATION        For patients on injectable medications: Patient currently has 2 doses left.  Next injection is scheduled for 05/24/2020.    SPECIALTY MEDICATION ADHERENCE     Medication Adherence    Patient reported X missed doses in the last month: 0  Specialty Medication: Humira CF 40 mg/0.4 ml  Patient is on additional specialty medications: No  Any gaps in refill history greater than 2 weeks in the last 3 months: no  Demonstrates understanding of importance of adherence: yes  Informant: patient  Reliability of informant: reliable  Confirmed plan for next specialty medication refill: delivery by pharmacy  Refills needed for supportive medications: not needed              Were doses missed due to medication being on hold? No    Humira CF 40/0.4 mg/ml: 14 days of medicine on hand       REFERRAL TO PHARMACIST     Referral to the pharmacist: Not needed      [facility]     Shipping address confirmed in Epic.     Delivery Scheduled: Yes, Expected medication delivery date: 05/25/2020.     Medication will be delivered via UPS to the prescription address in Epic WAM.    Carolyn Newton   [facility] Specialty Technician

## 2020-05-26 ENCOUNTER — Telehealth: Payer: Self-pay | Admitting: *Deleted

## 2020-05-26 NOTE — Telephone Encounter (Signed)
Form faxed to [employer] (physician's verification form)  [phone], received verification that it was received successfully.  Nothing further needed.

## 2020-05-29 ENCOUNTER — Encounter: Payer: Self-pay | Admitting: Internal Medicine

## 2020-05-30 ENCOUNTER — Other Ambulatory Visit: Payer: Self-pay

## 2020-05-30 ENCOUNTER — Ambulatory Visit (INDEPENDENT_AMBULATORY_CARE_PROVIDER_SITE_OTHER): Payer: 59 | Admitting: *Deleted

## 2020-05-30 DIAGNOSIS — M2042 Other hammer toe(s) (acquired), left foot: Secondary | ICD-10-CM

## 2020-05-30 DIAGNOSIS — E1142 Type 2 diabetes mellitus with diabetic polyneuropathy: Secondary | ICD-10-CM

## 2020-05-30 DIAGNOSIS — M2011 Hallux valgus (acquired), right foot: Secondary | ICD-10-CM

## 2020-05-30 DIAGNOSIS — M2041 Other hammer toe(s) (acquired), right foot: Secondary | ICD-10-CM

## 2020-05-30 NOTE — Progress Notes (Signed)
Patient presents to the office today for diabetic shoe and insole measuring.  Patient was measured with brannock device to determine size and width for 1 pair of extra depth shoes and foam casted for 3 pair of insoles.   Documentation of medical necessity will be sent to patient's treating diabetic doctor to verify and sign.   Patient's diabetic provider: Dr. Kara Mead  Shoes and insoles will be ordered at that time and patient will be notified for an appointment for fitting when they arrive.   Shoe size (per patient): Men's 10 wide   Brannock measurement: Women's - RIGHT-10.5C, LEFT-10C  Patient shoe selection-   1st choice:   Apex A215W  2nd choice:  Apex [id number]  Shoe size ordered: Women's 10.5 X-Wide

## 2020-06-01 ENCOUNTER — Other Ambulatory Visit: Payer: Self-pay | Admitting: Adult Health

## 2020-06-07 ENCOUNTER — Other Ambulatory Visit: Payer: Self-pay | Admitting: Internal Medicine

## 2020-06-07 DIAGNOSIS — Z1231 Encounter for screening mammogram for malignant neoplasm of breast: Secondary | ICD-10-CM

## 2020-06-18 NOTE — Unmapped (Addendum)
For the sarcoidosis:  - Inject 1 adalimumab pen weekly    For the itching:  - Take 1/4 of a   Today we did a steroid injection (triamcinolone injection). You can remove the bandaid at any time.     What is triamcinolone injection?  Triamcinolone is a steroid that prevents the release of substances in the body that cause inflammation.  Triamcinolone injection is used to treat many different types of inflammatory conditions, including severe allergic reactions, skin disorders, severe colitis, inflammation of the joints or tendons, blood cell disorders, inflammatory eye disorders, lung disorders, and problems caused by low adrenal gland hormones.  Triamcinolone is also used to treat certain skin disorders caused by autoimmune conditions such as lupus, psoriasis, lichen planus, and others.  How is triamcinolone injection given?  Triamcinolone injection is given through a needle and can be injected into different areas of the body: into a muscle, into the space around a joint or tendon, or into a lesion on the skin. A healthcare provider will give you this injection.  What are the possible side effects of triamcinolone injection?  Get emergency medical help if you have signs of an allergic reaction: hives; difficult breathing; swelling of your face, lips, tongue, or throat.  Call your doctor at once if you have:  blurred vision, tunnel vision, eye pain, or seeing halos around lights;  unusual changes in mood or behavior;    Common side effects of triamcinolone injections into skin lesions may include:  Skin changes (acne, redness, bruising, discoloration of the skin);  Local thinning of the skin and underlying fat (atrophy), this tends to improve with time  increased local hair growth, or thinning hair;  a wound that is slow to heal    This is not a complete list of side effects and others may occur. Call your doctor for medical advice about side effects. You may report side effects to FDA at 1-800-FDA-1088.

## 2020-06-18 NOTE — Unmapped (Signed)
Dermatology Note     Assessment and Plan:    ??  Lichen planus  - Diagnosis, treatment options, prognosis, risk/ benefit, and side effects of treatment were discussed with the patient.   -hydroquinone 4% cream for PIH for 12 weeks  - Jointly elected to proceed with repeat ILK today (see procedure note below):    Intralesional Kenalog Procedure Note: After the patient was informed of risks (including atrophy and dyspigmentation), benefits and side effects of intralesional steroid injection, the patient elected to undergo injection and verbal consent was obtained. Skin was cleaned with alcohol and injected intradermally into the sites (below). The patient tolerated the procedure well without complications and was instructed on post-procedure care.  Location(s): feet, scalp, left palm  Number of sites treated: >10  Kenalog (triamcinolone) Concentration: 10 mg/ml   Volume: 0.7 ml total    Sarcoidosis, cutaneous and pulmonary   1. Dr. Vassie Loll (pulmonary) in the loop, continue adalimumab 40mg  weekly (started eow 06/2016, increased to weekly 09/2016)  2. 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  3. Has had ophtho screening in the past, following with cardiologist and pulmonologist  4. Quant gold negative on 05/2019    Pruritus   - Continue hydroxyzine 25 mg tablet take 1/4 in the morning and 1/2 in the evening as needed.    - Continue naltrexone 12.5mg  (quartering 50mg  tablets) once daily    The patient was advised to call for an appointment should any new, changing, or symptomatic lesions develop.     RTC: Return in about 3 months (around 09/19/2020) for f/u lichen planus and sarcoidosis . or sooner as needed   _________________________________________________________________      Chief Complaint     Chief Complaint   Patient presents with   ??? LICHEN PLANUS     PT COMING FOR INJECTIONS IN FEET AND THIGHS       HPI     Carolyn Newton is a 60 y.o. female who presents as a returning patient (last seen by Dr. Janyth Contes on 02/07/2020) to [facility] for follow up of lichen planus. At last visit, patient was instructed to continue adalimumab 40mg  weekly for sarcoidosis, to continue hydroxyzine 25 mg tablet and naltrexone 12.5 mg for pruritus, and underwent ILK injections for lichen planus.    Today, she reports interest in repeat ILK injections today for lichen planus.    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  Many years on and off of prednisone with temporary improvement, but overall thinks she is worse over time.  Has had a large amount of weight gain in the last year since restarting prednisone in April 2016.  Had come off recently, but now on Medrol dose pack from her PCP.  Was on methotrexate given worsening cutaneous involvement of the scalp.  Dose was 10mg  taken 2 doses 12 hours apart in winter 2017-18.  Did that for 3 months, but stopped because she thought it wasn't helping.  Had been on hydroxychloroquine many years ago, but it was not very helpful.  Has been on doxycycline many times over the years without much improvement.    The patient denies any other new or changing lesions or areas of concern.     Pertinent Past Medical History     No history of skin cancer    Family History:   Negative for melanoma    Past Medical History, Family History,  Social History, Medication List, Allergies, and Problem List were reviewed in the rooming section of Epic.     ROS: Other than symptoms mentioned in the HPI, no fevers, chills, or other skin complaints    Physical Examination     GENERAL: Well-appearing female in no acute distress, resting comfortably.  NEURO: Alert and oriented, answers questions appropriately  PSYCH: Normal mood and affect  SKIN (Focal Skin Exam): Per patient request, examination of feet, left palm, and scalp was performed  - violaceous papules on dorsal feet, left palm and scalp    All areas not commented on are within normal limits or unremarkable    Scribe's Attestation: Carolyn Stain, MD obtained and performed the history, physical exam and medical decision making elements that were entered into the chart.  Signed by Milinda Cave, Scribe, on June 19, 2020 at 12:45PM    ----------------------------------------------------------------------------------------------------------------------  June 19, 2020 9:11 PM. Documentation assistance provided by the Scribe. I was present during the time the encounter was recorded. The information recorded by the Scribe was done at my direction and has been reviewed and validated by me.  ----------------------------------------------------------------------------------------------------------------------     (Approved Template 09/27/2019)

## 2020-06-19 ENCOUNTER — Ambulatory Visit: Admit: 2020-06-19 | Discharge: 2020-06-20 | Payer: MEDICARE

## 2020-06-19 DIAGNOSIS — D869 Sarcoidosis, unspecified: Principal | ICD-10-CM

## 2020-06-19 DIAGNOSIS — L439 Lichen planus, unspecified: Principal | ICD-10-CM

## 2020-06-19 DIAGNOSIS — Z79899 Other long term (current) drug therapy: Principal | ICD-10-CM

## 2020-06-19 MED ORDER — HYDROQUINONE 4 % TOPICAL CREAM
Freq: Two times a day (BID) | TOPICAL | 5 refills | 0.00000 days | Status: CP
Start: 2020-06-19 — End: 2021-06-19

## 2020-06-20 MED ADMIN — triamcinolone acetonide (KENALOG) injection 10 mg: 10 mg | INTRADERMAL | @ 01:00:00 | Stop: 2020-06-19

## 2020-06-20 NOTE — Unmapped (Signed)
[facility] Refill Coordination Note    Specialty Medication(s) to be Shipped:   Inflammatory Disorders: Humira    Other medication(s) to be shipped: No additional medications requested for fill at this time     Carolyn Newton, DOB: [birth year]  Phone: [phone] (home)       All above HIPAA information was verified with patient.     Was a Nurse, learning disability used for this call? No    Completed refill call assessment today to schedule patient's medication shipment from the [facility] [phone]).  All relevant notes have been reviewed.     Specialty medication(s) and dose(s) confirmed: Regimen is correct and unchanged.   Changes to medications: Danie reports no changes at this time.  Changes to insurance: No  New side effects reported not previously addressed with a pharmacist or physician: None reported  Questions for the pharmacist: No    Confirmed patient received a Conservation officer, historic buildings and a Surveyor, mining with first shipment. The patient will receive a drug information handout for each medication shipped and additional FDA Medication Guides as required.       DISEASE/MEDICATION-SPECIFIC INFORMATION        For patients on injectable medications: Patient currently has 1 doses left.  Next injection is scheduled for 06/21/2020.    SPECIALTY MEDICATION ADHERENCE     Medication Adherence    Patient reported X missed doses in the last month: 0  Specialty Medication: Humira CF 40 mg/0.4 ml  Patient is on additional specialty medications: No  Any gaps in refill history greater than 2 weeks in the last 3 months: no  Demonstrates understanding of importance of adherence: yes  Informant: patient  Reliability of informant: reliable  Confirmed plan for next specialty medication refill: delivery by pharmacy  Refills needed for supportive medications: not needed              Were doses missed due to medication being on hold? No    Humira CF 40/0.4 mg/ml: 7 days of medicine on hand REFERRAL TO PHARMACIST     Referral to the pharmacist: Not needed      [facility]     Shipping address confirmed in Epic.     Delivery Scheduled: Yes, Expected medication delivery date: 06/22/2020.     Medication will be delivered via UPS to the prescription address in Epic WAM.    Tru Leopard D Glenora Morocho   [facility] Specialty Technician

## 2020-06-21 MED FILL — HUMIRA PEN CITRATE FREE 40 MG/0.4 ML: SUBCUTANEOUS | 28 days supply | Qty: 4 | Fill #1

## 2020-06-27 ENCOUNTER — Other Ambulatory Visit: Payer: Self-pay | Admitting: Internal Medicine

## 2020-07-06 ENCOUNTER — Other Ambulatory Visit: Payer: Self-pay | Admitting: Internal Medicine

## 2020-07-06 DIAGNOSIS — G8929 Other chronic pain: Secondary | ICD-10-CM

## 2020-07-10 ENCOUNTER — Other Ambulatory Visit: Payer: Self-pay | Admitting: *Deleted

## 2020-07-12 NOTE — Patient Outreach (Signed)
[facility]) Care Management  [place]  07/12/2020   [name] 1960-09-18 [id number]  RN Health Coach telephone call to patient.  [employer] compliance verified. Per patient she is doing good. She stated that she is getting out. She is on continuous oxygen 2 liters per nasal cannula. Per patient she is walking and doing arm and ankle exercises every day for 20 minutes. Her appetite is good. No recent falls. Patient has agreed to further outreach calls.  Encounter Medications:  Outpatient Encounter Medications as of 07/10/2020  Medication Sig Note   albuterol (VENTOLIN HFA) 108 (90 Base) MCG/ACT inhaler Inhale 2 puffs into the lungs every 6 (six) hours as needed for wheezing or shortness of breath.    augmented betamethasone dipropionate (DIPROLENE-AF) 0.05 % ointment Apply topically 2 (two) times daily.    B-D ULTRA-FINE 33 LANCETS MISC Use as instructed to test sugars once  daily    benzonatate (TESSALON) 200 MG capsule Take 1 capsule (200 mg total) by mouth 3 (three) times daily as needed for cough.    budesonide-formoterol (SYMBICORT) 160-4.5 MCG/ACT inhaler Inhale 2 puffs into the lungs TWICE DAILY    carvedilol (COREG) 12.5 MG tablet Take 1 tablet (12.5 mg total) by mouth 2 (two) times daily with a meal.    Cholecalciferol (VITAMIN D3) 1.25 MG (50000 UT) CAPS Take 1 capsule by mouth every 30 (thirty) days.    clonazePAM (KLONOPIN) 0.5 MG tablet Take 1 TO 2 tablets (0.5-1 mg total) by mouth at bedtime as needed for anxiety.    Cyanocobalamin (VITAMIN B-12) 1000 MCG SUBL Place 1 tablet (1,000 mcg total) under the tongue daily.    dexlansoprazole (DEXILANT) 60 MG capsule Take 1 capsule (60 mg total) by mouth daily.    diphenoxylate-atropine (LOMOTIL) 2.5-0.025 MG tablet Take 1-2 tablets by mouth 4 (four) times daily as needed for diarrhea or loose stools.    diphenoxylate-atropine (LOMOTIL) 2.5-0.025 MG tablet Take 1 tablet by mouth 3 (three) times  daily as needed for diarrhea or loose stools.    furosemide (LASIX) 40 MG tablet TAKE 1 TABLET BY MOUTH EVERY MORNING FOR EDEMA    gabapentin (NEURONTIN) 300 MG capsule TAKE ONE CAPSULE BY MOUTH EVERY EVENING, MAY TAKE A SECOND CAPSULE DURING THE DAY AS NEEDED FOR PAIN    glucose blood (ACCU-CHEK GUIDE) test strip Use to check blood sugar once a day.    HUMIRA PEN 40 MG/0.4ML PNKT Inject 40 mg as directed every Wednesday.     hydrOXYzine (ATARAX/VISTARIL) 25 MG tablet Take 1 tablet (25 mg total) by mouth every 8 (eight) hours as needed.    lidocaine (XYLOCAINE) 5 % ointment Apply 1 application topically as needed. For bunion/toe pain 10/25/2019: Hasnt started   metFORMIN (GLUCOPHAGE-XR) 500 MG 24 hr tablet Take 2 tablets (1,000 mg total) by mouth at bedtime.    Multiple Vitamin (MULTIVITAMIN WITH MINERALS) TABS tablet Take 1 tablet by mouth daily.    naltrexone (DEPADE) 50 MG tablet Take 12.5 mg by mouth at bedtime.     OXYGEN Inhale into the lungs. Takes 2 liters every day (24/7)    potassium chloride (KLOR-CON) 10 MEQ tablet Take 1 tablet (10 mEq total) by mouth daily.    predniSONE (DELTASONE) 20 MG tablet Take 1 tablet (20 mg total) by mouth daily with breakfast.    SUPREP BOWEL PREP KIT 17.5-3.13-1.6 GM/177ML SOLN SMARTSIG:1 Kit(s) By Mouth Once    topiramate (TOPAMAX) 50 MG tablet Take 1 tablet (50 mg  total) by mouth 2 (two) times daily.    UNABLE TO FIND Use as instructed to check blood sugar once a day.    venlafaxine XR (EFFEXOR-XR) 75 MG 24 hr capsule Take 1 capsule (75 mg total) by mouth daily with breakfast.    XARELTO 20 MG TABS tablet Take 1 tablet (20 mg total) by mouth at bedtime.    No facility-administered encounter medications on file as of 07/10/2020.    Functional Status:  In your present state of health, do you have any difficulty performing the following activities: 03/21/2020  Hearing? Y  Vision? N  Difficulty concentrating or making decisions? Y  Walking or climbing  stairs? Y  Dressing or bathing? Y  Doing errands, shopping? N  Preparing Food and eating ? N  Using the Toilet? N  In the past six months, have you accidently leaked urine? N  Do you have problems with loss of bowel control? N  Managing your Medications? N  Managing your Finances? N  Housekeeping or managing your Housekeeping? Y  Some recent data might be hidden    Fall/Depression Screening: Fall Risk  07/10/2020 04/05/2020 03/21/2020  Falls in the past year? _0 Comment - - -  Number falls in past yr: 0 0 0  Injury with Fall? 0 0 0  Risk Factor Category  - - -  Risk for fall due to : History of fall(s);Impaired balance/gait;Impaired mobility History of fall(s);Impaired balance/gait;Impaired mobility Impaired balance/gait  Follow up Falls evaluation completed Falls evaluation completed Falls evaluation completed   PHQ 2/9 Scores 03/21/2020 02/02/2019 04/29/2018 04/29/2018 11/07/2017 10/06/2017 10/02/2017  PHQ - 2 Score _1 0 0  PHQ- 9 Score _2 - -    Assessment:   Care Plan Care Plan : COPD (Adult)  Updates made by Verlin Grills, RN since 07/12/2020 12:00 AM     Problem: Psychological Adjustment to Diagnosis (COPD)   Priority: Medium  Onset Date: 11/26/2019     Long-Range Goal: Adjustment to Disease Achieved   Start Date: 11/26/2019  Expected End Date: 01/12/2021  This Visit's Progress: On track  Recent Progress: On track  Priority: Medium  Note:   Evidence-based guidance:  Explore the patient and family's understanding of the disease; use open-ended questions to encourage patient and family to share what is important to them.  Assess and provide support around experience of loss and lifestyle adjustments, such as loss of function, roles, social ties, independence and hobbies.  Support adjustment to ?onew normal? with focus on maintaining daily life as closely as possible to life before chronic obstructive pulmonary disease (COPD) diagnosis.   Express empathy; listen actively by encouraging the patient and family to express feelings, concerns and fears; ask questions and encourage open communication regarding embarrassing or disturbing topics.  Assess for factors that may impact coping or adjustment, such as a preexisting mental health condition, prognosis, lack of social support, debilitating disease or financial difficulty that include no or insufficient insurance coverage.  Assess and address fear or anxiety related to dyspnea or perceived stigma of a noninfectious cough.  Assess anxiety, feelings of panic when breathing becomes labored, as well as impact on family/caregiver; consider cognitive behavioral therapy, deep breathing, meditation, visualization, photo or light therapy.  Encourage advanced care planning discussion to clarify goals of care; palliative care or hospice may be considered for advanced COPD patients if it aligns with patient's goals of care.  Explore common risk factors  for depression, such as personal or family history of depression, substance use and stressors that include missed work, losing ability to do what patient was used to doing, changes in appearance, physical   or sexual abuse.  Use a validated screening tool to identify level of suicide risk. If at risk, develop safety plan and implement additional safety measures based on level of risk, such as behavioral health referral or immediate assessment.   Notes:     Problem: Disease Progression (COPD)   Priority: Medium  Onset Date: 11/26/2019     Long-Range Goal: Disease Progression Minimized or Managed   Start Date: 11/26/2019  Expected End Date: 01/12/2021  This Visit's Progress: On track  Recent Progress: On track  Priority: Medium  Note:   Evidence-based guidance:  Identify current smoking/tobacco use; provide smoking cessation intervention.  Assess symptom control by the frequency and type of symptoms, reliever use and activity limitation at  every encounter.  Assess risk for exacerbation (flare up) by evaluating spirometry, pulse oximetry, reliever use, presentation of symptoms and activity limitation; anticipate treatment adjustment based on risks and resources.  Develop and/or review and reinforce use of COPD rescue (action) plan even when symptoms are controlled or infrequent.  Ask patient to bring inhaler to all visits; assess and reinforce correct technique; address barriers to proper inhaler use, such as older age, use of multiple devices and lack of understanding.   Identify symptom triggers, such as smoking, virus, weather change, emotional upset, exercise, obesity and environmental allergen; consider reduction of work-exposure versus elimination to avoid compromising employment.  Correlate presentation to comorbidity, such as diabetes, heart failure, obstructive sleep apnea, depression and anxiety, which may worsen symptoms.  Promote participation in pulmonary rehabilitation for breathing exercises, skills training, improved exercise capacity, mood and quality of life; address barriers to participation.  Promote physical activity or exercise to improve or maintain exercise capacity, based on tolerance that may include walking, water exercise, cycling or limb muscle strength training.  Promote use of energy conservation and activity pacing techniques.  Promote use of breathing and coughing techniques, such as inspiratory muscle training, pursed-lip breathing, diaphragmatic breathing, pranayama yoga breathing or huff cough.  Screen for malnutrition risk factors, such as unintentional weight loss and poor oral intake; refer to dietitian if identified.  Screen for obstructive sleep apnea; prepare patient for polysomnography based on risk and presentation.  Prepare patient for use of long-term oxygen and noninvasive ventilation to relieve hypercapnia, hypoxemia, obstructive sleep apnea and reduce work of breathing.   Notes:      Problem: Symptom Exacerbation (COPD)   Priority: Medium  Onset Date: 11/26/2019     Long-Range Goal: Symptom Exacerbation Prevented or Minimized   Start Date: 11/26/2019  Expected End Date: 01/12/2021  This Visit's Progress: On track  Priority: High  Note:   Evidence-based guidance:  Monitor for signs of respiratory infection, including changes in sputum color, volume and thickness, as well as fever.  Encourage infection prevention strategies that may include prophylactic antibiotic therapy for patients with history of frequent exacerbations or antibiotic administration during exacerbation based on presentation, risk and benefit.  Encourage receipt of influenza and pneumococcal vaccine.  Prepare patient for use of home long-term oxygen therapy in presence of sever resting hypoxemia.  Prepare patients for laboratory studies or diagnostic exams, such as spirometry, pulse oximetry and arterial blood gas based on current symptoms, risk factors and presentation.  Assess barriers and manage adherence, including inhaler technique and persistent trigger exposure; encourage adherence, even  when symptoms are controlled or infrequent.  Assess and monitor for signs/symptoms of psychosocial concerns, such as shortness of breath-anxiety cycle or depression that may impact stability of symptoms.  Identify economic resources, sociocultural beliefs, social factors and health literacy that may interfere with adherence.  Promote lifestyle changes when needed, including regular physical activity based on tolerance, weight loss, healthy eating and stress management.  Consider referral to nurse or community health worker or home-visiting program for intensive support and education (disease-management program).  Increase frequency of follow-up following exacerbation or hospitalization; consider transition of care interventions, such as hospital visit, home visit, telephone follow-up, review of discharge summary  and resource referrals.   Notes:       Goals Addressed             This Visit's Progress    (THN)Eat Healthy       Timeframe:  Long-Range Goal Priority:  Medium Start Date:      19622297                       Expected End Date:    98921194                  Follow Up Date [id number]   - set goal weight - drink 6 to 8 glasses of water each day - manage portion size - set a realistic goal - switch to sugar-free drinks    Why is this important?   When you are ready to manage your nutrition or weight, having a plan and setting goals will help.  Taking small steps to change how you eat and exercise is a good place to start.    Notes:  Patient calls out to order food. Need to work on making healthy choices\ [id number] Patient is eating one meal a day [id number]       [facility] and Do Breathing Exercises       Timeframe:  Long-Range Goal Priority:  Medium Start Date:  [id number]                           Expected End Date:  41287867                    Follow Up Date [id number]   - do exercises in a comfortable position that makes breathing as easy as possible    Why is this important?   Breathing exercises can help lessen the cough that comes with chronic obstructive pulmonary disease.  Doing the exercises will give you more energy.  They will also help you to control your symptoms.    Notes:  [id number] Patient is able to do purse lip breathing.      (THN)Manage Fatigue (Tiredness)       Timeframe:  Long-Range Goal Priority:  High Start Date:    94765465                         Expected End Date: 03546568                     Follow Up Date 12751700   - eat healthy - use devices that will help like a cane, sock-puller or reacher    Why is this important?   Feeling tired or worn out is a common symptom of COPD (chronic obstructive pulmonary disease).  Learning when you feel your best and when  you need rest is important.  Managing the tiredness (fatigue) will help you be  active and enjoy life.     Notes:  Patient takes frequent times yo rest. She is on O2 continuously and gets short of breath with min exertion      (THN)Track and Manage My Symptoms       Timeframe:  Long-Range Goal Priority:  Medium Start Date: [id number]                            Expected End Date:      27035009                Follow Up Date 38182993   - develop a rescue plan - eliminate symptom triggers at home - follow rescue plan if symptoms flare-up - keep follow-up appointments    Why is this important?   Tracking your symptoms and other information about your health helps your doctor plan your care.  Write down the symptoms, the time of day, what you were doing and what medicine you are taking.  You will soon learn how to manage your symptoms.     Notes:   07/10/2020 She is tracking and monitoring her symptoms      (THN)Track and Manage My Triggers       Timeframe:  Long-Range Goal Priority:  Medium Start Date:     71696789                        Expected End Date:  38101751                    Follow Up Date [id number] - avoid second hand smoke - limit outdoor activity during cold weather - listen for public air quality announcements every day    Why is this important?   Triggers are activities or things, like tobacco smoke or cold weather, that make your COPD (chronic obstructive pulmonary disease) flare-up.  Knowing these triggers helps you plan how to stay away from them.  When you cannot remove them, you can learn how to manage them.     Notes:  [id number] Patient is monitoring her symptoms for COPD exacerbation         Plan:  Follow-up: Patient agrees to Care Plan and Follow-up. Patient will follow up with getting last booster Patient will continue with low sodium diet RN will follow up within the month of September RN sent update assessment to PCP  [facility] [phone]

## 2020-07-12 NOTE — Patient Instructions (Signed)
Goals Addressed             This Visit's Progress    (THN)Eat Healthy       Timeframe:  Long-Range Goal Priority:  Medium Start Date:      46270350                       Expected End Date:    09381829                  Follow Up Date 93716967   - set goal weight - drink 6 to 8 glasses of water each day - manage portion size - set a realistic goal - switch to sugar-free drinks    Why is this important?   When you are ready to manage your nutrition or weight, having a plan and setting goals will help.  Taking small steps to change how you eat and exercise is a good place to start.    Notes:  Patient calls out to order food. Need to work on making healthy choices\ [id number] Patient is eating one meal a day [id number]       [facility] and Do Breathing Exercises       Timeframe:  Long-Range Goal Priority:  Medium Start Date:  77824235                           Expected End Date:  36144315                    Follow Up Date 40086761   - do exercises in a comfortable position that makes breathing as easy as possible    Why is this important?   Breathing exercises can help lessen the cough that comes with chronic obstructive pulmonary disease.  Doing the exercises will give you more energy.  They will also help you to control your symptoms.    Notes:  [id number] Patient is able to do purse lip breathing.      (THN)Manage Fatigue (Tiredness)       Timeframe:  Long-Range Goal Priority:  High Start Date:    12458099                         Expected End Date: 83382505                     Follow Up Date 39767341   - eat healthy - use devices that will help like a cane, sock-puller or reacher    Why is this important?   Feeling tired or worn out is a common symptom of COPD (chronic obstructive pulmonary disease).  Learning when you feel your best and when you need rest is important.  Managing the tiredness (fatigue) will help you be active and enjoy life.     Notes:   Patient takes frequent times yo rest. She is on O2 continuously and gets short of breath with min exertion      (THN)Track and Manage My Symptoms       Timeframe:  Long-Range Goal Priority:  Medium Start Date: 93790240                            Expected End Date:      97353299                Follow Up  Date 47092957   - develop a rescue plan - eliminate symptom triggers at home - follow rescue plan if symptoms flare-up - keep follow-up appointments    Why is this important?   Tracking your symptoms and other information about your health helps your doctor plan your care.  Write down the symptoms, the time of day, what you were doing and what medicine you are taking.  You will soon learn how to manage your symptoms.     Notes:   07/10/2020 She is tracking and monitoring her symptoms      (THN)Track and Manage My Triggers       Timeframe:  Long-Range Goal Priority:  Medium Start Date:     47340370                        Expected End Date:  96438381                    Follow Up Date 84037543 - avoid second hand smoke - limit outdoor activity during cold weather - listen for public air quality announcements every day    Why is this important?   Triggers are activities or things, like tobacco smoke or cold weather, that make your COPD (chronic obstructive pulmonary disease) flare-up.  Knowing these triggers helps you plan how to stay away from them.  When you cannot remove them, you can learn how to manage them.     Notes:  [id number] Patient is monitoring her symptoms for COPD exacerbation

## 2020-07-19 ENCOUNTER — Ambulatory Visit (INDEPENDENT_AMBULATORY_CARE_PROVIDER_SITE_OTHER): Payer: 59 | Admitting: Internal Medicine

## 2020-07-19 ENCOUNTER — Encounter: Payer: Self-pay | Admitting: Internal Medicine

## 2020-07-19 ENCOUNTER — Other Ambulatory Visit: Payer: Self-pay

## 2020-07-19 VITALS — BP 132/72 | HR 67 | Temp 98.0°F | Ht 67.0 in | Wt 249.8 lb

## 2020-07-19 DIAGNOSIS — D869 Sarcoidosis, unspecified: Secondary | ICD-10-CM | POA: Diagnosis not present

## 2020-07-19 DIAGNOSIS — I2782 Chronic pulmonary embolism: Secondary | ICD-10-CM

## 2020-07-19 DIAGNOSIS — F419 Anxiety disorder, unspecified: Secondary | ICD-10-CM

## 2020-07-19 DIAGNOSIS — Z0000 Encounter for general adult medical examination without abnormal findings: Secondary | ICD-10-CM | POA: Diagnosis not present

## 2020-07-19 DIAGNOSIS — Z9981 Dependence on supplemental oxygen: Secondary | ICD-10-CM

## 2020-07-19 DIAGNOSIS — Z23 Encounter for immunization: Secondary | ICD-10-CM

## 2020-07-19 LAB — CBC WITH DIFFERENTIAL/PLATELET
Basophils Absolute: 0 10*3/uL (ref 0.0–0.1)
Basophils Relative: 0.4 % (ref 0.0–3.0)
Eosinophils Absolute: 0.1 10*3/uL (ref 0.0–0.7)
Eosinophils Relative: 1.4 % (ref 0.0–5.0)
HCT: 32.9 % — ABNORMAL LOW (ref 36.0–46.0)
Hemoglobin: 10.7 g/dL — ABNORMAL LOW (ref 12.0–15.0)
Lymphocytes Relative: 41.1 % (ref 12.0–46.0)
Lymphs Abs: 2.9 10*3/uL (ref 0.7–4.0)
MCHC: 32.4 g/dL (ref 30.0–36.0)
MCV: 88.8 fl (ref 78.0–100.0)
Monocytes Absolute: 0.5 10*3/uL (ref 0.1–1.0)
Monocytes Relative: 7.6 % (ref 3.0–12.0)
Neutro Abs: 3.4 10*3/uL (ref 1.4–7.7)
Neutrophils Relative %: 49.5 % (ref 43.0–77.0)
Platelets: 202 10*3/uL (ref 150.0–400.0)
RBC: 3.71 Mil/uL — ABNORMAL LOW (ref 3.87–5.11)
RDW: 16.9 % — ABNORMAL HIGH (ref 11.5–15.5)
WBC: 6.9 10*3/uL (ref 4.0–10.5)

## 2020-07-19 LAB — LIPID PANEL
Cholesterol: 210 mg/dL — ABNORMAL HIGH (ref 0–200)
HDL: 77.2 mg/dL (ref >39.00)
LDL Cholesterol: 111 mg/dL — ABNORMAL HIGH (ref 0–99)
NonHDL: 132.61
Total CHOL/HDL Ratio: 3
Triglycerides: 109 mg/dL (ref 0.0–149.0)
VLDL: 21.8 mg/dL (ref 0.0–40.0)

## 2020-07-19 LAB — COMPREHENSIVE METABOLIC PANEL
ALT: 13 U/L (ref 0–35)
AST: 18 U/L (ref 0–37)
Albumin: 3.9 g/dL (ref 3.5–5.2)
Alkaline Phosphatase: 61 U/L (ref 39–117)
BUN: 26 mg/dL — ABNORMAL HIGH (ref 6–23)
CO2: 28 mEq/L (ref 19–32)
Calcium: 9.3 mg/dL (ref 8.4–10.5)
Chloride: 105 mEq/L (ref 96–112)
Creatinine, Ser: 0.94 mg/dL (ref 0.40–1.20)
GFR: 66.2 mL/min (ref >60.00)
Glucose, Bld: 109 mg/dL — ABNORMAL HIGH (ref 70–99)
Potassium: 4 mEq/L (ref 3.5–5.1)
Sodium: 140 mEq/L (ref 135–145)
Total Bilirubin: 0.3 mg/dL (ref 0.2–1.2)
Total Protein: 7.1 g/dL (ref 6.0–8.3)

## 2020-07-19 LAB — URINALYSIS
Bilirubin Urine: NEGATIVE
Hgb urine dipstick: NEGATIVE
Leukocytes,Ua: NEGATIVE
Nitrite: NEGATIVE
Specific Gravity, Urine: >=1.03 — AB (ref 1.000–1.030)
Total Protein, Urine: NEGATIVE
Urine Glucose: NEGATIVE
Urobilinogen, UA: 1 (ref 0.0–1.0)
pH: 5.5 (ref 5.0–8.0)

## 2020-07-19 LAB — TSH: TSH: 2.5 u[IU]/mL (ref 0.35–5.50)

## 2020-07-19 NOTE — Assessment & Plan Note (Signed)
Cont w/Lexapro 

## 2020-07-19 NOTE — Addendum Note (Signed)
Addended by: Earnstine Regal on: 07/19/2020 02:38 PM   Modules accepted: Orders

## 2020-07-19 NOTE — Addendum Note (Signed)
Addended by: Jacobo Forest on: 07/19/2020 02:27 PM   Modules accepted: Orders

## 2020-07-19 NOTE — Assessment & Plan Note (Signed)
On O2 

## 2020-07-19 NOTE — Assessment & Plan Note (Signed)
Cont w/Xarelto °

## 2020-07-19 NOTE — Assessment & Plan Note (Addendum)
  We discussed age appropriate health related issues, including available/recomended screening tests and vaccinations. Labs were ordered to be later reviewed . All questions were answered. We discussed one or more of the following - seat belt use, use of sunscreen/sun exposure exercise, fall risk reduction, second hand smoke exposure, firearm use and storage, seat belt use, a need for adhering to healthy diet and exercise. Labs were ordered.  All questions were answered. Prevnar 20 Colon 2021

## 2020-07-19 NOTE — Assessment & Plan Note (Signed)
On O2. Cont w/Prednisone

## 2020-07-19 NOTE — Progress Notes (Signed)
Subjective:  Patient ID: [name], female    DOB: 06-01-1960  Age: 60 y.o. MRN: [id number]  CC: Follow-up (3 month f/u)   HPI [name] presents for sarcoidosis, anticoagulations Well exam Outpatient Medications Prior to Visit  Medication Sig Dispense Refill   albuterol (VENTOLIN HFA) 108 (90 Base) MCG/ACT inhaler Inhale 2 puffs into the lungs every 6 (six) hours as needed for wheezing or shortness of breath. 8.5 g 0   augmented betamethasone dipropionate (DIPROLENE-AF) 0.05 % ointment Apply topically 2 (two) times daily.     B-D ULTRA-FINE 33 LANCETS MISC Use as instructed to test sugars once  daily 100 each 12   benzonatate (TESSALON) 200 MG capsule Take 1 capsule (200 mg total) by mouth 3 (three) times daily as needed for cough. 30 capsule 0   budesonide-formoterol (SYMBICORT) 160-4.5 MCG/ACT inhaler Inhale 2 puffs into the lungs TWICE DAILY 10.2 g 5   carvedilol (COREG) 12.5 MG tablet Take 1 tablet (12.5 mg total) by mouth 2 (two) times daily with a meal. 180 tablet 3   Cholecalciferol (VITAMIN D3) 1.25 MG (50000 UT) CAPS Take 1 capsule by mouth every 30 (thirty) days. 3 capsule 3   clonazePAM (KLONOPIN) 0.5 MG tablet Take 1 TO 2 tablets (0.5-1 mg total) by mouth at bedtime as needed for anxiety. 180 tablet 1   Cyanocobalamin (VITAMIN B-12) 1000 MCG SUBL Place 1 tablet (1,000 mcg total) under the tongue daily. 100 tablet 3   dexlansoprazole (DEXILANT) 60 MG capsule Take 1 capsule (60 mg total) by mouth daily. 90 capsule 3   diphenoxylate-atropine (LOMOTIL) 2.5-0.025 MG tablet Take 1-2 tablets by mouth 4 (four) times daily as needed for diarrhea or loose stools. 60 tablet 0   diphenoxylate-atropine (LOMOTIL) 2.5-0.025 MG tablet Take 1 tablet by mouth 3 (three) times daily as needed for diarrhea or loose stools. 60 tablet 3   furosemide (LASIX) 40 MG tablet TAKE 1 TABLET BY MOUTH EVERY MORNING FOR EDEMA 90 tablet 1   gabapentin (NEURONTIN) 300 MG capsule  TAKE ONE CAPSULE BY MOUTH EVERY EVENING, MAY TAKE A SECOND CAPSULE DURING THE DAY AS NEEDED FOR PAIN 180 capsule 1   glucose blood (ACCU-CHEK GUIDE) test strip Use to check blood sugar once a day. 100 each 12   HUMIRA PEN 40 MG/0.4ML PNKT Inject 40 mg as directed every Wednesday.      hydrOXYzine (ATARAX/VISTARIL) 25 MG tablet Take 1 tablet (25 mg total) by mouth every 8 (eight) hours as needed. 60 tablet 1   lidocaine (XYLOCAINE) 5 % ointment Apply 1 application topically as needed. For bunion/toe pain 35.44 g 0   metFORMIN (GLUCOPHAGE-XR) 500 MG 24 hr tablet Take 2 tablets (1,000 mg total) by mouth at bedtime. 180 tablet 2   Multiple Vitamin (MULTIVITAMIN WITH MINERALS) TABS tablet Take 1 tablet by mouth daily.     Multiple Vitamins-Minerals (MULTIVITAMIN WOMEN 50+) TABS Take 1 tablet by mouth daily.     naltrexone (DEPADE) 50 MG tablet Take 12.5 mg by mouth at bedtime.      OXYGEN Inhale into the lungs. Takes 2 liters every day (24/7)     potassium chloride (KLOR-CON) 10 MEQ tablet Take 1 tablet (10 mEq total) by mouth daily. 90 tablet 1   predniSONE (DELTASONE) 20 MG tablet Take 1 tablet (20 mg total) by mouth daily with breakfast. 5 tablet 0   SUPREP BOWEL PREP KIT 17.5-3.13-1.6 GM/177ML SOLN SMARTSIG:1 Kit(s) By Mouth Once 540 mL 0   topiramate (  TOPAMAX) 50 MG tablet Take 1 tablet (50 mg total) by mouth 2 (two) times daily. 180 tablet 3   UNABLE TO FIND Use as instructed to check blood sugar once a day.     venlafaxine XR (EFFEXOR-XR) 75 MG 24 hr capsule Take 1 capsule (75 mg total) by mouth daily with breakfast. 30 capsule 11   XARELTO 20 MG TABS tablet Take 1 tablet (20 mg total) by mouth at bedtime. 90 tablet 3   No facility-administered medications prior to visit.    ROS: Review of Systems  Constitutional:  Positive for fatigue. Negative for activity change, appetite change, chills and unexpected weight change.  HENT:  Negative for congestion, mouth sores and sinus pressure.    Eyes:  Negative for visual disturbance.  Respiratory:  Positive for shortness of breath. Negative for cough and chest tightness.   Gastrointestinal:  Negative for abdominal pain and nausea.  Genitourinary:  Negative for difficulty urinating, frequency and vaginal pain.  Musculoskeletal:  Positive for arthralgias, back pain and gait problem.  Skin:  Negative for pallor and rash.  Neurological:  Negative for dizziness, tremors, weakness, numbness and headaches.  Psychiatric/Behavioral:  Positive for dysphoric mood. Negative for confusion, sleep disturbance and suicidal ideas. The patient is nervous/anxious.    Objective:  BP 132/72 (BP Location: Left Arm)   Pulse 67   Temp 98 F (36.7 C) (Oral)   Ht 5' 7"  (1.702 m)   Wt 249 lb 12.8 oz (113.3 kg)   LMP 05/19/2003   SpO2 95%   BMI 39.12 kg/m   BP Readings from Last 3 Encounters:  07/19/20 132/72  05/15/20 120/80  04/17/20 (!) 162/82    Wt Readings from Last 3 Encounters:  07/19/20 249 lb 12.8 oz (113.3 kg)  05/15/20 248 lb 12.8 oz (112.9 kg)  04/17/20 257 lb (116.6 kg)    Physical Exam Constitutional:      General: She is not in acute distress.    Appearance: She is well-developed. She is obese.  HENT:     Head: Normocephalic.     Right Ear: External ear normal.     Left Ear: External ear normal.     Nose: Nose normal.  Eyes:     General:        Right eye: No discharge.        Left eye: No discharge.     Conjunctiva/sclera: Conjunctivae normal.     Pupils: Pupils are equal, round, and reactive to light.  Neck:     Thyroid: No thyromegaly.     Vascular: No JVD.     Trachea: No tracheal deviation.  Cardiovascular:     Rate and Rhythm: Normal rate and regular rhythm.     Heart sounds: Normal heart sounds.  Pulmonary:     Effort: No respiratory distress.     Breath sounds: No stridor. No wheezing.  Abdominal:     General: Bowel sounds are normal. There is no distension.     Palpations: Abdomen is soft. There is  no mass.     Tenderness: There is no abdominal tenderness. There is no guarding or rebound.  Musculoskeletal:        General: Tenderness present.     Cervical back: Normal range of motion and neck supple. No rigidity.  Lymphadenopathy:     Cervical: No cervical adenopathy.  Skin:    Findings: No erythema or rash.  Neurological:     Mental Status: She is oriented to person, place, and  time.     Cranial Nerves: No cranial nerve deficit.     Motor: Weakness present. No abnormal muscle tone.     Coordination: Coordination normal.     Gait: Gait abnormal.     Deep Tendon Reflexes: Reflexes normal.  Psychiatric:        Behavior: Behavior normal.        Thought Content: Thought content normal.        Judgment: Judgment normal.  On O2 Using a cane  Lab Results  Component Value Date   WBC 6.2 04/07/2018   HGB 11.5 (L) 04/07/2018   HCT 35.3 (L) 04/07/2018   PLT 234.0 04/07/2018   GLUCOSE 111 (H) 04/17/2020   CHOL 220 (H) 08/27/2018   TRIG 107 08/27/2018   HDL 88 08/27/2018   LDLDIRECT 107.0 06/30/2017   LDLCALC 111 (H) 08/27/2018   ALT 10 04/17/2020   AST 16 04/17/2020   NA 142 04/17/2020   K 3.9 04/17/2020   CL 108 04/17/2020   CREATININE 0.95 04/17/2020   BUN 18 04/17/2020   CO2 25 04/17/2020   TSH 1.20 04/17/2020   INR 1.00 02/11/2018   HGBA1C 5.6 04/17/2020   MICROALBUR <0.7 02/01/2015    No results found.  Assessment & Plan:     Follow-up: No follow-ups on file.  Walker Kehr, MD

## 2020-07-20 MED ORDER — HYDROQUINONE 4 % TOPICAL CREAM
Freq: Two times a day (BID) | TOPICAL | 5 refills | 0 days
Start: 2020-07-20 — End: 2021-07-20

## 2020-07-20 NOTE — Unmapped (Signed)
Carolyn Newton's sarcoid areas are stable. She sees pulmonary soon, but she feels she's had no changes with lung function.     She's had a hard time getting to the pharmacy to get hydroquinone - we discussed it's often times not covered, but we can use a discount card to see if cost is affordable/ship to her. Will request order.     [facility] Clinical Assessment & Refill Coordination Note    Cystal Shannahan, DOB: [birth year]  Phone: [phone] (home)     All above HIPAA information was verified with patient.     Was a Nurse, learning disability used for this call? No    Specialty Medication(s):   Inflammatory Disorders: Humira     Current Outpatient Medications   Medication Sig Dispense Refill   ??? albuterol (PROVENTIL HFA;VENTOLIN HFA) 90 mcg/actuation inhaler Inhale 2 puffs.     ??? amoxicillin-clavulanate (AUGMENTIN) 875-125 mg per tablet Take 1 tablet by mouth every twelve (12) hours.     ??? blood sugar diagnostic (ON CALL EXPRESS TEST STRIP) Strp Use as instructed to check blood sugar once a day. 100 each 12   ??? blood sugar diagnostic Strp TEST TWICE DAILY     ??? blood-glucose meter (ON CALL EXPRESS METER) Misc Use to check blood sugar once a day 1 each 0   ??? blood-glucose meter kit Use to check blood sugar 2 times per day     ??? carvedilol (COREG) 12.5 MG tablet TAKE 1 TABLET BY MOUTH TWICE DAILY WITH A MEAL     ??? chlorhexidine (PERIDEX) 0.12 % solution 5 mL.     ??? clobetasoL (TEMOVATE) 0.05 % cream Apply topically Two (2) times a day. 60 g 5   ??? clonazePAM (KLONOPIN) 0.5 MG tablet      ??? dexlansoprazole (DEXILANT) 60 mg capsule      ??? diclofenac sodium (VOLTAREN) 1 % gel Apply 2 g topically 2 (two) times daily as needed (for pain). 100/4=25     ??? docusate sodium (COLACE) 100 MG capsule Take 100 mg by mouth.     ??? empty container (SHARPS CONTAINER) Misc Use as directed to dispose of Humira needles 1 each 2   ??? empty container Misc USE AS DIRECTED 1 each 2   ??? escitalopram oxalate (LEXAPRO) 20 MG tablet TAKE 1 TABLET BY MOUTH DAILY     ??? esomeprazole (NEXIUM) 40 MG capsule Take 40 mg by mouth.     ??? fluticasone propionate (FLONASE) 50 mcg/actuation nasal spray 1 spray into each nostril.     ??? furosemide (LASIX) 20 MG tablet Take 20 mg by mouth.     ??? gabapentin (NEURONTIN) 300 MG capsule Take 1 capsule (300 mg total) by mouth 2 (two) times daily as needed (for pain).  1   ??? HUMIRA PEN CITRATE FREE 40 MG/0.4 ML Inject the contents of 1 syringe (40 mg total) under the skin every seven (7) days. 4 each 11   ??? hydroquinone 4 % cream Apply topically Two (2) times a day. For 12 weeks 30 g 5   ??? hydrOXYzine (ATARAX) 25 MG tablet Take 1/4 tablet in the morning and 1/2 tablet in the evenings as needed for itch 30 tablet 5   ??? lancets 28 gauge Misc 1 each.     ??? lancets 30 gauge Misc Use as instructed to test sugars once daily 100 each 12   ??? levoFLOXacin (LEVAQUIN) 750 MG tablet Take 750 mg by mouth.     ???  metFORMIN (GLUCOPHAGE) 500 MG tablet TAKE 2 TABLETS(1000 MG) BY MOUTH DAILY WITH FOOD     ??? moxifloxacin (AVELOX) 400 mg tablet Take 1 tablet (400 mg total) by mouth daily. 7 tablet 0   ??? naltrexone (DEPADE) 50 mg tablet Split tablets into quarters. Take one-quarter of a tablet once daily 30 tablet 11   ??? nortriptyline (PAMELOR) 75 MG capsule Take 75 mg by mouth.     ??? nystatin (MYCOSTATIN) 100,000 unit/mL suspension      ??? omeprazole (PRILOSEC) 20 MG capsule Take 20 mg by mouth.     ??? potassium chloride (KLOR-CON) 10 MEQ CR tablet TAKE 1 TABLET BY MOUTH DAILY     ??? rivaroxaban (XARELTO) 20 mg tablet TAKE 1 TABLET BY MOUTH EVERY DAY WITH SUPPER     ??? suvorexant (BELSOMRA) 20 mg tablet Take 20 mg by mouth.     ??? SYMBICORT 160-4.5 mcg/actuation inhaler Inhale 2 puffs into the lungs TWICE DAILY 120/4=30  5   ??? topiramate (TOPAMAX) 50 MG tablet TAKE 1 TABLET BY MOUTH TWICE DAILY     ??? triamcinolone (KENALOG) 0.1 % cream Apply topically Two (2) times a day. As needed for itching 454 g 5   ??? venlafaxine (EFFEXOR-XR) 75 MG 24 hr capsule Take 75 mg by mouth.       Current Facility-Administered Medications   Medication Dose Route Frequency Provider Last Rate Last Admin   ??? triamcinolone acetonide (KENALOG) injection 10 mg  10 mg Intradermal Once Elsie Stain, MD            Changes to medications: Jodelle reports no changes at this time.    Allergies   Allergen Reactions   ??? Other Other (See Comments)     Patient has Sarcoidosis and can't tolerate any metals   ??? Suvorexant Other (See Comments)     Larey Seat out of bed while asleep: I'm waking up as I'm falling on the floor; Night terrors   ??? Enalapril Maleate      REACTION: cough   ??? Hydrochlorothiazide      REACTION: Low potassium   ??? Hydroxychloroquine Sulfate      REACTION: vision changes   ??? Iodine      Mother was intolerant--CODED on CT table---pt never tired.   ??? Latex      Hives   ??? Nickel Other (See Comments)     Pt has a titanium right and left knee - nickel causes skin irritations that forms into blisters and sores  Other reaction(s): Other (See Comments)  Pt has a titanium right knee - nickel causes skin irritations that forms into blisters and sores   ??? Adhesive Tape-Silicones Other (See Comments)     Medical tape causes bruising   ??? Doxycycline Diarrhea and Nausea And Vomiting   ??? Morphine Itching and Nausea Only   ??? Pregabalin Other (See Comments)     Made me feel high       Changes to allergies: No    SPECIALTY MEDICATION ADHERENCE     Humira - 2 left       Specialty medication(s) dose(s) confirmed: Regimen is correct and unchanged.     Are there any concerns with adherence? No    Adherence counseling provided? Not needed    CLINICAL MANAGEMENT AND INTERVENTION      Clinical Benefit Assessment:    Do you feel the medicine is effective or helping your condition? Yes    Clinical Benefit counseling provided? Not needed  Adverse Effects Assessment:    Are you experiencing any side effects? No    Are you experiencing difficulty administering your medicine? No    Quality of Life Assessment:    Quality of Life    Rheumatology  On a scale of 1 - 10 with 1 representing not at all and 10 representing completely - how has your rheumatologic condition affected your:  Oncology  Dermatology  1.On a scale of 1-10, how itchy, sore, painful or stinging has your skin condition been within the last week?: 9  2.On a scale of 1-10, how embarrassed or self-conscious have you felt because of your skin within the last week?: 10  3.On a scale of 1-10, how much has your skin affected your social or leisure activities within the last week?: 5          Have you discussed this with your provider? Yes  - most of it from lichen planus. Sarcoid areas that Humira is treating doesn't itch/hurt.   Acute Infection Status:    Acute infections noted within Epic:  No active infections  Patient reported infection: None    Therapy Appropriateness:    Is therapy appropriate? Yes, therapy is appropriate and should be continued    DISEASE/MEDICATION-SPECIFIC INFORMATION      For patients on injectable medications: Patient currently has 2 doses left.  Next injection is scheduled for Wed, 7/13.    PATIENT SPECIFIC NEEDS     - Does the patient have any physical, cognitive, or cultural barriers? No    - Is the patient high risk? No    - Does the patient require a Care Management Plan? No     - Does the patient require physician intervention or other additional services (i.e. nutrition, smoking cessation, social work)? No      SHIPPING     Specialty Medication(s) to be Shipped:   Inflammatory Disorders: Humira    Other medication(s) to be shipped: possible hydroquinone? will request order, need to know our cash price since likely not covered     Changes to insurance: No    Delivery Scheduled: Yes, Expected medication delivery date: Wed, 7/20.     Medication will be delivered via UPS to the confirmed prescription address in [facility].    The patient will receive a drug information handout for each medication shipped and additional FDA Medication Guides as required.  Verified that patient has previously received a Conservation officer, historic buildings and a Surveyor, mining.    The patient or caregiver noted above participated in the development of this care plan and knows that they can request review of or adjustments to the care plan at any time.      All of the patient's questions and concerns have been addressed.    Lanney Gins   [facility] Specialty Pharmacist

## 2020-07-21 MED ORDER — HYDROQUINONE 4 % TOPICAL CREAM
Freq: Two times a day (BID) | TOPICAL | 5 refills | 0 days | Status: CP
Start: 2020-07-21 — End: 2021-07-21

## 2020-07-25 ENCOUNTER — Ambulatory Visit (INDEPENDENT_AMBULATORY_CARE_PROVIDER_SITE_OTHER): Payer: 59 | Admitting: Pulmonary Disease

## 2020-07-25 ENCOUNTER — Encounter: Payer: Self-pay | Admitting: Pulmonary Disease

## 2020-07-25 ENCOUNTER — Other Ambulatory Visit: Payer: Self-pay

## 2020-07-25 DIAGNOSIS — D869 Sarcoidosis, unspecified: Secondary | ICD-10-CM

## 2020-07-25 DIAGNOSIS — J9611 Chronic respiratory failure with hypoxia: Secondary | ICD-10-CM

## 2020-07-25 MED ORDER — ALBUTEROL SULFATE HFA 108 (90 BASE) MCG/ACT IN AERS: 2.0000 | INHALATION_SPRAY | Freq: Four times a day (QID) | RESPIRATORY_TRACT | 3 refills | Status: DC | PRN

## 2020-07-25 NOTE — Patient Instructions (Addendum)
Refills on albuterol

## 2020-07-25 NOTE — Progress Notes (Signed)
   Subjective:    Patient ID: [name], female    DOB: 30-Jul-1960, 60 y.o.   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 She has chronic respiratory failure on Oxygen on 2l/m. Has OSA , CPAP intolerant.   05/2020 >> given Medrol dose pack and Ceftin by PCP, office visit, given Augmentin and prednisone Breathing wise she is back to her baseline Uses walker for ambulation. Her younger brother passed away few months ago when she has significant sadness related to this, still grieving. She is compliant with Symbicort and needs refills on albuterol. Humira has really helped her skin lesions Pedal edema is improved   Significant tests/ events reviewed  2018 started on Humira for reoccurring cutaneous lesions not responding to methotrexate.  Previously on prednisone but tapered off.   Admitted 07/21/2016 for RLE DVT-had stopped Xarelto 04/2016 for prior pulmonary embolism   CT scan = chronic appearing subsegmental PE , on Xarelto lifelong    PFT 05/2014 >> FVC 65%, ratio 69, FEV1 58%, DLCO 62%   VQ Scan 08/17/15 >> LLL & RUL acute PE   ECHO 08/18/15 >> mild LVH , EF 55-60%, Gr 1 DD .   LE Venous Doppler 08/18/15 >> neg DVT , no bakers cyst, ?prominent inguinal lymph node measuring 3.1cm   MRI brain neg Spine Center = MRI spine = mild to moderate disc protrusion/left-sided spinal stenosis-chronic pain   NPSG 12/2004 mild, RDI 13/h     CT chest July 2020 showed stable sarcoid to mildly decreased.(Nodular opacities (stable mediastinal and bilateral hilar adenopathy decreased.(Nodular opacities (stable mediastinal and bilateral hilar adenopathy.    PFTs July 2020 showed stable lung function with FEV1 at 70%, ratio 81, FVC 67% and DLCO was 61%.  This was similar to 2016  Review of Systems neg  for any significant sore throat, dysphagia, itching, sneezing, nasal congestion or excess/ purulent secretions, fever, chills, sweats, unintended wt loss, pleuritic or exertional cp, hempoptysis, orthopnea pnd or change in chronic leg swelling. Also denies presyncope, palpitations, heartburn, abdominal pain, nausea, vomiting, diarrhea or change in bowel or urinary habits, dysuria,hematuria, rash, arthralgias, visual complaints, headache, numbness weakness or ataxia.     Objective:   Physical Exam  Gen. Pleasant, obese, in no distress ENT - no lesions, no post nasal drip Neck: No JVD, no thyromegaly, no carotid bruits Lungs: no use of accessory muscles, no dullness to percussion, decreased without rales or rhonchi  Cardiovascular: Rhythm regular, heart sounds  normal, no murmurs or gallops, no peripheral edema Musculoskeletal: No deformities, no cyanosis or clubbing , no tremors       Assessment & Plan:

## 2020-07-25 NOTE — Assessment & Plan Note (Signed)
Appears stable Last chest x-ray reviewed 05/2020 which does not show any evidence of parenchymal disease.  She has calcified lymphadenopathy.  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

## 2020-07-25 NOTE — Assessment & Plan Note (Signed)
Continue 2 L of oxygen at all times

## 2020-07-31 ENCOUNTER — Other Ambulatory Visit: Payer: Self-pay | Admitting: Internal Medicine

## 2020-08-01 MED FILL — HUMIRA PEN CITRATE FREE 40 MG/0.4 ML: SUBCUTANEOUS | 28 days supply | Qty: 4 | Fill #2

## 2020-08-03 ENCOUNTER — Ambulatory Visit
Admission: RE | Admit: 2020-08-03 | Discharge: 2020-08-03 | Disposition: A | Discharge status: Home / Self Care | Payer: 59 | Source: Ambulatory Visit

## 2020-08-03 ENCOUNTER — Other Ambulatory Visit: Payer: Self-pay

## 2020-08-03 DIAGNOSIS — Z1231 Encounter for screening mammogram for malignant neoplasm of breast: Secondary | ICD-10-CM

## 2020-08-04 NOTE — Unmapped (Signed)
[facility] Clinical Intervention    Type of intervention: Medication access    Medication involved: hydroquinone 4% cream    Problem identified: Carolyn Newton had previously told me she was having trouble getting hydroquinone. I offered to get verbal order and see our price, and potentially ship.    Intervention performed: I called and let her know our price with [employer] card is $36.03, but there are some cheaper pharmacies with GoodRx ([facility], [facility]). She'll plan to take her paper rx to one of those pharmacies to get filled, but will let us know if she needs [place] to send in the future.     Follow-up needed: na    Approximate time spent: 10-15 minutes    Clinical evidence used to support intervention: Professional judgement    Chemical engineer Shared [facility] Specialty Pharmacist

## 2020-08-07 ENCOUNTER — Other Ambulatory Visit: Payer: Self-pay | Admitting: Internal Medicine

## 2020-08-09 ENCOUNTER — Other Ambulatory Visit: Payer: Self-pay

## 2020-08-09 ENCOUNTER — Ambulatory Visit (INDEPENDENT_AMBULATORY_CARE_PROVIDER_SITE_OTHER): Payer: 59 | Admitting: Sports Medicine

## 2020-08-09 DIAGNOSIS — M2041 Other hammer toe(s) (acquired), right foot: Secondary | ICD-10-CM

## 2020-08-09 DIAGNOSIS — M2042 Other hammer toe(s) (acquired), left foot: Secondary | ICD-10-CM

## 2020-08-09 DIAGNOSIS — M2011 Hallux valgus (acquired), right foot: Secondary | ICD-10-CM

## 2020-08-09 DIAGNOSIS — E1142 Type 2 diabetes mellitus with diabetic polyneuropathy: Secondary | ICD-10-CM

## 2020-08-09 NOTE — Progress Notes (Signed)
Patient presented to the office today to pick up diabetic shoes and 3 pair diabetic custom inserts  1 pair of inserts were put in the shoes and the shoes were too big and were slipping up and down on the back of the heel and patient wanted to size down  Patient wanted the same shoe [id number] in a size 10  Patient will be contacted when the shoes are ready for pick up

## 2020-08-09 NOTE — Progress Notes (Signed)
Patient discussed with medical assistant. Agree with her note.  Patient to follow up as scheduled for continued care or sooner if problems or issues arise. -Dr. Cannon Kettle

## 2020-08-10 MED ORDER — NALTREXONE 50 MG TABLET
ORAL_TABLET | 11 refills | 0 days | Status: CP
Start: 2020-08-10 — End: ?

## 2020-08-10 NOTE — Unmapped (Signed)
Refill for Naltrexone

## 2020-08-15 ENCOUNTER — Other Ambulatory Visit: Payer: Self-pay | Admitting: Internal Medicine

## 2020-08-16 ENCOUNTER — Other Ambulatory Visit: Payer: Self-pay | Admitting: Internal Medicine

## 2020-08-19 ENCOUNTER — Emergency Department (HOSPITAL_COMMUNITY): Payer: 59

## 2020-08-19 ENCOUNTER — Emergency Department (HOSPITAL_COMMUNITY)
Admission: EM | Admit: 2020-08-19 | Discharge: 2020-08-19 | Disposition: A | Discharge status: Home / Self Care | Payer: 59 | Attending: Emergency Medicine | Admitting: Emergency Medicine

## 2020-08-19 ENCOUNTER — Encounter (HOSPITAL_COMMUNITY): Payer: Self-pay | Admitting: Emergency Medicine

## 2020-08-19 ENCOUNTER — Other Ambulatory Visit: Payer: Self-pay

## 2020-08-19 DIAGNOSIS — M542 Cervicalgia: Secondary | ICD-10-CM | POA: Insufficient documentation

## 2020-08-19 DIAGNOSIS — Z7951 Long term (current) use of inhaled steroids: Secondary | ICD-10-CM | POA: Insufficient documentation

## 2020-08-19 DIAGNOSIS — Z7901 Long term (current) use of anticoagulants: Secondary | ICD-10-CM | POA: Diagnosis not present

## 2020-08-19 DIAGNOSIS — Z7984 Long term (current) use of oral hypoglycemic drugs: Secondary | ICD-10-CM | POA: Insufficient documentation

## 2020-08-19 DIAGNOSIS — I2510 Atherosclerotic heart disease of native coronary artery without angina pectoris: Secondary | ICD-10-CM | POA: Insufficient documentation

## 2020-08-19 DIAGNOSIS — E1122 Type 2 diabetes mellitus with diabetic chronic kidney disease: Secondary | ICD-10-CM | POA: Diagnosis not present

## 2020-08-19 DIAGNOSIS — Z96653 Presence of artificial knee joint, bilateral: Secondary | ICD-10-CM | POA: Insufficient documentation

## 2020-08-19 DIAGNOSIS — M546 Pain in thoracic spine: Secondary | ICD-10-CM | POA: Diagnosis not present

## 2020-08-19 DIAGNOSIS — N189 Chronic kidney disease, unspecified: Secondary | ICD-10-CM | POA: Diagnosis not present

## 2020-08-19 DIAGNOSIS — Z87891 Personal history of nicotine dependence: Secondary | ICD-10-CM | POA: Insufficient documentation

## 2020-08-19 DIAGNOSIS — Z91040 Latex allergy status: Secondary | ICD-10-CM | POA: Diagnosis not present

## 2020-08-19 DIAGNOSIS — I129 Hypertensive chronic kidney disease with stage 1 through stage 4 chronic kidney disease, or unspecified chronic kidney disease: Secondary | ICD-10-CM | POA: Diagnosis not present

## 2020-08-19 DIAGNOSIS — Z79899 Other long term (current) drug therapy: Secondary | ICD-10-CM | POA: Insufficient documentation

## 2020-08-19 DIAGNOSIS — Y92410 Unspecified street and highway as the place of occurrence of the external cause: Secondary | ICD-10-CM | POA: Diagnosis not present

## 2020-08-19 DIAGNOSIS — J449 Chronic obstructive pulmonary disease, unspecified: Secondary | ICD-10-CM | POA: Insufficient documentation

## 2020-08-19 DIAGNOSIS — M545 Low back pain, unspecified: Secondary | ICD-10-CM | POA: Insufficient documentation

## 2020-08-19 MED ORDER — LIDOCAINE 5 % EX PTCH: 1.0000 | MEDICATED_PATCH | Freq: Every day | CUTANEOUS | 0 refills | Status: DC | PRN
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
## 2020-08-21 ENCOUNTER — Telehealth: Payer: Self-pay | Admitting: Pharmacist

## 2020-08-21 DIAGNOSIS — T50901A Poisoning by unspecified drugs, medicaments and biological substances, accidental (unintentional), initial encounter: Secondary | ICD-10-CM
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
## 2020-08-22 ENCOUNTER — Telehealth: Payer: Self-pay | Admitting: Internal Medicine

## 2020-08-22 NOTE — Telephone Encounter (Signed)
Patient was in [facility] & was seen at ED 08.06.22  Provider at ED prescribed patient lidocaine (LIDODERM) 5 %  Patient insurance is requiring a PA but they have not been able to contact ED provider  Wants to know if Dr. Alain Marion is able to authorize PA for the medication  Followup w/ patient [phone]

## 2020-08-23 NOTE — Unmapped (Signed)
[facility] Refill Coordination Note    Specialty Medication(s) to be Shipped:   Inflammatory Disorders: Humira    Other medication(s) to be shipped: No additional medications requested for fill at this time     Carolyn Newton, DOB: 07/07/1960  Phone: [phone] (home)       All above HIPAA information was verified with patient.     Was a Nurse, learning disability used for this call? No    Completed refill call assessment today to schedule patient's medication shipment from the [facility] [phone]).  All relevant notes have been reviewed.     Specialty medication(s) and dose(s) confirmed: Regimen is correct and unchanged.   Changes to medications: Cristela reports no changes at this time.  Changes to insurance: No  New side effects reported not previously addressed with a pharmacist or physician: None reported  Questions for the pharmacist: No    Confirmed patient received a Conservation officer, historic buildings and a Surveyor, mining with first shipment. The patient will receive a drug information handout for each medication shipped and additional FDA Medication Guides as required.       DISEASE/MEDICATION-SPECIFIC INFORMATION        For patients on injectable medications: Patient currently has 2 doses left.  Next injection is scheduled for 08/23/2020.    SPECIALTY MEDICATION ADHERENCE     Medication Adherence    Patient reported X missed doses in the last month: 0  Specialty Medication: Humira CF 40 mg/0.4 ml  Patient is on additional specialty medications: No  Any gaps in refill history greater than 2 weeks in the last 3 months: no  Demonstrates understanding of importance of adherence: yes  Informant: patient  Reliability of informant: reliable  Confirmed plan for next specialty medication refill: delivery by pharmacy  Refills needed for supportive medications: not needed              Were doses missed due to medication being on hold? No    Humira CF 40/0.4 mg/ml: 14 days of medicine on hand       REFERRAL TO PHARMACIST     Referral to the pharmacist: Not needed      [facility]     Shipping address confirmed in Epic.     Delivery Scheduled: Yes, Expected medication delivery date: 08/30/2020.     Medication will be delivered via UPS to the prescription address in Epic WAM.    Carolyn Newton D Adlai Nieblas   [facility] Specialty Technician

## 2020-08-23 NOTE — Telephone Encounter (Signed)
Notified pt we have Submitted PA via cover-my-meds w/(Key: [facility]). Waiting on insurance response...[name]

## 2020-08-24 ENCOUNTER — Ambulatory Visit: Payer: Medicare (Managed Care) | Admitting: Sports Medicine

## 2020-08-24 NOTE — Telephone Encounter (Signed)
Submitted PA via cover-my-meds. Rec'd msg stating " This request has received a Favorable outcome.Request Reference Number: [id number]. LIDODERM DIS 5% is approved through 01/13/2021. Your patient may now fill this prescription and it will be covered." Notified pt w/status.Marland KitchenJohny Chess

## 2020-08-25 ENCOUNTER — Other Ambulatory Visit: Payer: Self-pay | Admitting: Internal Medicine

## 2020-08-29 MED FILL — HUMIRA PEN CITRATE FREE 40 MG/0.4 ML: SUBCUTANEOUS | 28 days supply | Qty: 4 | Fill #3

## 2020-08-31 ENCOUNTER — Ambulatory Visit: Payer: 59 | Admitting: Sports Medicine

## 2020-09-07 ENCOUNTER — Encounter: Payer: Self-pay | Admitting: Sports Medicine

## 2020-09-07 ENCOUNTER — Other Ambulatory Visit: Payer: Self-pay

## 2020-09-07 ENCOUNTER — Ambulatory Visit (INDEPENDENT_AMBULATORY_CARE_PROVIDER_SITE_OTHER): Payer: 59 | Admitting: Sports Medicine

## 2020-09-07 DIAGNOSIS — B351 Tinea unguium: Secondary | ICD-10-CM

## 2020-09-07 DIAGNOSIS — M2141 Flat foot [pes planus] (acquired), right foot: Secondary | ICD-10-CM

## 2020-09-07 DIAGNOSIS — M2142 Flat foot [pes planus] (acquired), left foot: Secondary | ICD-10-CM

## 2020-09-07 DIAGNOSIS — E1142 Type 2 diabetes mellitus with diabetic polyneuropathy: Secondary | ICD-10-CM

## 2020-09-07 DIAGNOSIS — M2041 Other hammer toe(s) (acquired), right foot: Secondary | ICD-10-CM

## 2020-09-07 DIAGNOSIS — M2011 Hallux valgus (acquired), right foot: Secondary | ICD-10-CM

## 2020-09-07 DIAGNOSIS — M79675 Pain in left toe(s): Secondary | ICD-10-CM | POA: Diagnosis not present

## 2020-09-07 DIAGNOSIS — M79674 Pain in right toe(s): Secondary | ICD-10-CM

## 2020-09-07 DIAGNOSIS — M2042 Other hammer toe(s) (acquired), left foot: Secondary | ICD-10-CM

## 2020-09-07 DIAGNOSIS — L439 Lichen planus, unspecified: Secondary | ICD-10-CM

## 2020-09-07 DIAGNOSIS — M79672 Pain in left foot: Secondary | ICD-10-CM

## 2020-09-07 DIAGNOSIS — M79671 Pain in right foot: Secondary | ICD-10-CM

## 2020-09-07 DIAGNOSIS — B359 Dermatophytosis, unspecified: Secondary | ICD-10-CM

## 2020-09-07 MED ORDER — LAMISIL AT SPRAY 1 % EX SOLN: CUTANEOUS | 1 refills | Status: DC
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
## 2020-09-11 ENCOUNTER — Other Ambulatory Visit: Payer: Self-pay | Admitting: Internal Medicine

## 2020-09-21 NOTE — Unmapped (Signed)
[facility] Refill Coordination Note    Specialty Medication(s) to be Shipped:   Inflammatory Disorders: Humira    Other medication(s) to be shipped: No additional medications requested for fill at this time     Kerilyn Cortner, DOB: 1960/07/21  Phone: [phone] (home)       All above HIPAA information was verified with patient.     Was a Nurse, learning disability used for this call? No    Completed refill call assessment today to schedule patient's medication shipment from the [facility] [phone]).  All relevant notes have been reviewed.     Specialty medication(s) and dose(s) confirmed: Regimen is correct and unchanged.   Changes to medications: Arpi reports no changes at this time.  Changes to insurance: No  New side effects reported not previously addressed with a pharmacist or physician: None reported  Questions for the pharmacist: No    Confirmed patient received a Conservation officer, historic buildings and a Surveyor, mining with first shipment. The patient will receive a drug information handout for each medication shipped and additional FDA Medication Guides as required.       DISEASE/MEDICATION-SPECIFIC INFORMATION        For patients on injectable medications: Patient currently has 0 doses left.  Next injection is scheduled for 09/27/2020.     SPECIALTY MEDICATION ADHERENCE     Medication Adherence    Patient reported X missed doses in the last month: 0  Specialty Medication: Humira  Patient is on additional specialty medications: No        Were doses missed due to medication being on hold? No    REFERRAL TO PHARMACIST     Referral to the pharmacist: Not needed      [facility]     Shipping address confirmed in Epic.     Delivery Scheduled: Yes, Expected medication delivery date: 09/26/2020.     Medication will be delivered via UPS to the prescription address in Epic WAM.    [name] Specialty Technician
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
## 2020-09-22 ENCOUNTER — Encounter: Payer: Self-pay | Admitting: Internal Medicine

## 2020-09-22 ENCOUNTER — Other Ambulatory Visit: Payer: Self-pay

## 2020-09-22 ENCOUNTER — Ambulatory Visit (INDEPENDENT_AMBULATORY_CARE_PROVIDER_SITE_OTHER): Payer: 59 | Admitting: Internal Medicine

## 2020-09-22 VITALS — BP 146/80 | HR 58 | Temp 98.1°F | Ht 67.0 in | Wt 261.0 lb

## 2020-09-22 DIAGNOSIS — M542 Cervicalgia: Secondary | ICD-10-CM

## 2020-09-22 DIAGNOSIS — G4486 Cervicogenic headache: Secondary | ICD-10-CM

## 2020-09-22 MED ORDER — METHOCARBAMOL 500 MG PO TABS
500.0000 mg | ORAL_TABLET | Freq: Three times a day (TID) | ORAL | 0 refills | Status: DC | PRN
Start: 2020-09-22 — End: 2021-02-01

## 2020-09-22 NOTE — Patient Instructions (Addendum)
    Medications changes include :   muscle relaxer - methocarbamol 500 mg three times a day as needed.      Your prescription(s) have been submitted to your pharmacy. Please take as directed and contact our office if you believe you are having problem(s) with the medication(s).   Try heat on your neck and posterior head.    Consider physical therapy.

## 2020-09-25 MED FILL — HUMIRA PEN CITRATE FREE 40 MG/0.4 ML: SUBCUTANEOUS | 28 days supply | Qty: 4 | Fill #4

## 2020-09-29 ENCOUNTER — Other Ambulatory Visit: Payer: Self-pay | Admitting: Pulmonary Disease

## 2020-09-29 ENCOUNTER — Other Ambulatory Visit: Payer: Self-pay | Admitting: Adult Health

## 2020-10-03 ENCOUNTER — Ambulatory Visit: Payer: Self-pay | Admitting: *Deleted

## 2020-10-12 NOTE — Unmapped (Signed)
[facility] Refill Coordination Note    Specialty Medication(s) to be Shipped:   Inflammatory Disorders: Humira    Other medication(s) to be shipped: No additional medications requested for fill at this time     Carolyn Newton, DOB: 1960-05-12  Phone: [phone] (home)       All above HIPAA information was verified with patient.     Was a Nurse, learning disability used for this call? No    Completed refill call assessment today to schedule patient's medication shipment from the [facility] [phone]).  All relevant notes have been reviewed.     Specialty medication(s) and dose(s) confirmed: Regimen is correct and unchanged.   Changes to medications: Carolyn Newton reports no changes at this time.  Changes to insurance: No  New side effects reported not previously addressed with a pharmacist or physician: None reported  Questions for the pharmacist: No    Confirmed patient received a Conservation officer, historic buildings and a Surveyor, mining with first shipment. The patient will receive a drug information handout for each medication shipped and additional FDA Medication Guides as required.       DISEASE/MEDICATION-SPECIFIC INFORMATION        For patients on injectable medications: Patient currently has 2 doses left.  Next injection is scheduled for 10/18/2020.     SPECIALTY MEDICATION ADHERENCE     Medication Adherence    Patient reported X missed doses in the last month: 0  Specialty Medication: Humira CF 40 mg/0.4 ml  Patient is on additional specialty medications: No  Any gaps in refill history greater than 2 weeks in the last 3 months: no  Demonstrates understanding of importance of adherence: yes  Informant: patient  Reliability of informant: reliable  Confirmed plan for next specialty medication refill: delivery by pharmacy  Refills needed for supportive medications: not needed        Were doses missed due to medication being on hold? No    REFERRAL TO PHARMACIST     Referral to the pharmacist: Not needed      [facility]     Shipping address confirmed in Epic.     Delivery Scheduled: Yes, Expected medication delivery date: 10/24/2020.     Medication will be delivered via UPS to the prescription address in Epic WAM.    Carolyn Newton   [facility] Specialty Technician

## 2020-10-16 ENCOUNTER — Ambulatory Visit: Admit: 2020-10-16 | Discharge: 2020-10-17 | Payer: MEDICARE

## 2020-10-16 DIAGNOSIS — Z79899 Other long term (current) drug therapy: Principal | ICD-10-CM

## 2020-10-16 DIAGNOSIS — L299 Pruritus, unspecified: Principal | ICD-10-CM

## 2020-10-16 DIAGNOSIS — D869 Sarcoidosis, unspecified: Principal | ICD-10-CM

## 2020-10-16 DIAGNOSIS — L439 Lichen planus, unspecified: Principal | ICD-10-CM

## 2020-10-16 MED ORDER — HALOBETASOL PROPIONATE 0.05 % TOPICAL OINTMENT
Freq: Two times a day (BID) | TOPICAL | 5 refills | 0.00000 days | Status: CP
Start: 2020-10-16 — End: 2021-10-16

## 2020-10-16 NOTE — Unmapped (Signed)
Dermatology Note     Assessment and Plan:      Lichen planus  - Diagnosis, treatment options, prognosis, risk/ benefit, and side effects of treatment were discussed with the patient.   - Start halobetasol 0.05% ointment BID x 2 weeks. Discussed using occlusion with compress or plastic wrap x3-5 days when lesions flare.      ??Intralesional Kenalog Procedure Note: After the patient was informed of risks (including atrophy and dyspigmentation), benefits and side effects of intralesional steroid injection, the patient elected to undergo injection and verbal consent was obtained. Skin was cleaned with alcohol and injected intradermally into the sites (below). The patient tolerated the procedure well without complications and was instructed on post-procedure care.  Location(s): bilateral   Number of sites treated: 5  Kenalog (triamcinolone) Concentration: 10 mg/ml    Volume: 0.4 ml total    Sarcoidosis, cutaneous and pulmonary  - Dr. Vassie Loll (pulmonary) in the loop, continue adalimumab 40mg  weekly (started eow 06/2016, increased to weekly 09/2016). Next follow-up with pulmonology on 01/25/2021.   - Has had ophtho screening in the past, next ophtho appointment scheduled for 10/20/2020.   - Quant gold negative on 05/2019; print out of new order provided today for patient to obtain at next internal medicine visit on 11/20/2020  ??  Pruritus??  -??Continue hydroxyzine 25 mg tablet take 1/4 in the morning and 1/2 in the evening as needed.????  -??Continue naltrexone 12.5mg  (quartering 50mg  tablets) once daily    The patient was advised to call for an appointment should any new, changing, or symptomatic lesions develop.     RTC: Return in about 3 months (around 01/16/2021). or sooner as needed   _________________________________________________________________      Chief Complaint     Chief Complaint   Patient presents with   ??? Follow-up     Lichen planus and Sarcoidosis tx Humira does help but the Hydroquinone does not, pt will bring current medications to visit       HPI     Carolyn Newton is a 60 y.o. female who presents as a returning patient (last seen by Dr. Janyth Contes on 06/19/2020) to [facility] for follow up of lichen planus and sarcoidosis. At last visit, patient was to continue hydroquinone 4% cream for lichen planus and continue adalimumab 40mg  weekly for sarcoidosis. Patient was also to continue hydroxyzine 25 mg and naltrexone 12.5mg  for pruritus. She also underwent ILK injections for lichen planus at previous visit.     Today, patient reports that hydroxyzine and naltrexone are not helping relieve itching and pain on feet. She has also used triamcinolone cream without relief. Reports that previous areas of lichen planus / sarcoidosis involvement are stable. Uses hair strengthening solution when lesions flare on scalp. She was unable to obtain hydroquinone for PIH due to cost (~$58). She is still doing well on Humira.     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  Many years on and off of prednisone with temporary improvement, but overall thinks she is worse over time.  Has had a large amount of weight gain in the last year since restarting prednisone in April 2016.   Was on methotrexate given worsening cutaneous involvement of the scalp.  Dose was 10mg  taken 2 doses 12 hours apart in winter 2017-18.  Did that for 3 months, but stopped because she thought it wasn't helping.  Had been on hydroxychloroquine many years ago, but it was not very helpful.  Has  been on doxycycline many times over the years without much improvement.     The patient denies any other new or changing lesions or areas of concern.     Pertinent Past Medical History     No history of skin cancer    Family History:   Negative for melanoma    Past Medical History, Family History, Social History, Medication List, Allergies, and Problem List were reviewed in the rooming section of Epic.     ROS: Other than symptoms mentioned in the HPI, no fevers, chills, or other skin complaints    Physical Examination     GENERAL: Well-appearing female in no acute distress, resting comfortably.  NEURO: Alert and oriented, answers questions appropriately  PSYCH: Normal mood and affect  SKIN: Examination of the hands and feet was performed  violaceous macules on the bilateral feet      All areas not commented on are within normal limits or unremarkable      [name]) [name], PA-S2 contributed to this note

## 2020-10-23 MED FILL — HUMIRA PEN CITRATE FREE 40 MG/0.4 ML: SUBCUTANEOUS | 28 days supply | Qty: 4 | Fill #5

## 2020-11-01 ENCOUNTER — Other Ambulatory Visit: Payer: Self-pay | Admitting: Internal Medicine

## 2020-11-03 ENCOUNTER — Other Ambulatory Visit: Payer: Self-pay | Admitting: Internal Medicine

## 2020-11-08 ENCOUNTER — Other Ambulatory Visit: Payer: Self-pay | Admitting: Internal Medicine

## 2020-11-09 ENCOUNTER — Other Ambulatory Visit: Payer: Self-pay | Admitting: Internal Medicine

## 2020-11-13 NOTE — Unmapped (Signed)
[facility] Refill Coordination Note    Specialty Medication(s) to be Shipped:   Inflammatory Disorders: Humira    Other medication(s) to be shipped: No additional medications requested for fill at this time     Carolyn Newton, DOB: [birth year]  Phone: [phone] (home)       All above HIPAA information was verified with patient.     Was a Nurse, learning disability used for this call? No    Completed refill call assessment today to schedule patient's medication shipment from the [facility] [phone]).  All relevant notes have been reviewed.     Specialty medication(s) and dose(s) confirmed: Regimen is correct and unchanged.   Changes to medications: Dorinda reports no changes at this time.  Changes to insurance: No  New side effects reported not previously addressed with a pharmacist or physician: None reported  Questions for the pharmacist: No    Confirmed patient received a Conservation officer, historic buildings and a Surveyor, mining with first shipment. The patient will receive a drug information handout for each medication shipped and additional FDA Medication Guides as required.       DISEASE/MEDICATION-SPECIFIC INFORMATION        For patients on injectable medications: Patient currently has 2 doses left.  Next injection is scheduled for 11/15/2020.     SPECIALTY MEDICATION ADHERENCE     Medication Adherence    Patient reported X missed doses in the last month: 0  Specialty Medication: Humira CF 40 mg/0.4 ml  Patient is on additional specialty medications: No  Any gaps in refill history greater than 2 weeks in the last 3 months: no  Demonstrates understanding of importance of adherence: yes  Informant: patient  Reliability of informant: reliable  Confirmed plan for next specialty medication refill: delivery by pharmacy  Refills needed for supportive medications: not needed        Were doses missed due to medication being on hold? No    REFERRAL TO PHARMACIST     Referral to the pharmacist: Not needed      [facility]     Shipping address confirmed in Epic.     Delivery Scheduled: Yes, Expected medication delivery date: 11/16/2020.     Medication will be delivered via UPS to the prescription address in Epic WAM.    Prescott Truex D Ciaira Natividad   [facility] Specialty Technician

## 2020-11-15 MED FILL — HUMIRA PEN CITRATE FREE 40 MG/0.4 ML: SUBCUTANEOUS | 28 days supply | Qty: 4 | Fill #6

## 2020-11-16 ENCOUNTER — Other Ambulatory Visit: Payer: Self-pay | Admitting: *Deleted

## 2020-11-16 ENCOUNTER — Other Ambulatory Visit: Payer: Self-pay | Admitting: Internal Medicine

## 2020-11-16 NOTE — Patient Instructions (Signed)
Goals Addressed             This Visit's Progress    (THN)Eat Healthy   On track    Timeframe:  Long-Range Goal Priority:  Medium Start Date:      17616073                       Expected End Date:    71062694        Follow Up Date [id number]   - set goal weight - drink 6 to 8 glasses of water each day - manage portion size - set a realistic goal - switch to sugar-free drinks    Why is this important?   When you are ready to manage your nutrition or weight, having a plan and setting goals will help.  Taking small steps to change how you eat and exercise is a good place to start.    Notes:  Patient calls out to order food. Need to work on making healthy choices\ [id number] Patient is eating one meal a day [id number] Per patient her appetite is good. Her Blood sugars have not been over 130. Per patient adhering to diet (Diabetic, Low sodium)     (THN)Learn and Do Breathing Exercises   On track    Timeframe:  Long-Range Goal Priority:  Medium Start Date:  78938101                           Expected End Date:  75102585     Follow Up Date 27782423   - do exercises in a comfortable position that makes breathing as easy as possible    Why is this important?   Breathing exercises can help lessen the cough that comes with chronic obstructive pulmonary disease.  Doing the exercises will give you more energy.  They will also help you to control your symptoms.    Notes:  [id number] Patient is able to do purse lip breathing. [id number] Patient is doing breathing exercises     (THN)Manage Fatigue (Tiredness)   On track    Timeframe:  Long-Range Goal Priority:  High Start Date:    19509326                         Expected End Date: 71245809              Follow Up Date 98338250   - eat healthy - use devices that will help like a cane, sock-puller or reacher    Why is this important?   Feeling tired or worn out is a common symptom of COPD (chronic obstructive pulmonary disease).   Learning when you feel your best and when you need rest is important.  Managing the tiredness (fatigue) will help you be active and enjoy life.     Notes:  Patient takes frequent times yo rest. She is on O2 continuously and gets short of breath with min exertion [id number] Patient is taking frequent rest periods when fatigued     (THN)Track and Manage My Symptoms   On track    Timeframe:  Long-Range Goal Priority:  Medium Start Date: 19379024                            Expected End Date:      09735329  Follow Up Date 59292446   - develop a rescue plan - eliminate symptom triggers at home - follow rescue plan if symptoms flare-up - keep follow-up appointments    Why is this important?   Tracking your symptoms and other information about your health helps your doctor plan your care.  Write down the symptoms, the time of day, what you were doing and what medicine you are taking.  You will soon learn how to manage your symptoms.     Notes:   07/10/2020 She is tracking and monitoring her symptoms [id number] Patient is monitoring her symptoms. She knows it is worse when in heat and cold     (THN)Track and Manage My Triggers   On track    Timeframe:  Long-Range Goal Priority:  Medium Start Date:     11657903                        Expected End Date:  83338329              Follow Up Date 19166060  - avoid second hand smoke - limit outdoor activity during cold weather - listen for public air quality announcements every day    Why is this important?   Triggers are activities or things, like tobacco smoke or cold weather, that make your COPD (chronic obstructive pulmonary disease) flare-up.  Knowing these triggers helps you plan how to stay away from them.  When you cannot remove them, you can learn how to manage them.     Notes:  [id number] Patient is monitoring her symptoms for COPD exacerbation [id number] Patient is currently still smoking.  She is monitoring when to go  outside. She is taking her medications as per ordered

## 2020-11-16 NOTE — Patient Outreach (Signed)
[facility]) Care Management  [place]  11/16/2020   [name] 1960-01-25 [id number]  RN Health Coach telephone call to patient.  [employer] compliance verified. Per patient her breathing is good. She is wearing a mask when sh goes out. She has received her flu vaccine. She monitors the temperature outside. Per patient the heat or cold makes it hard to breathe. Patient stated that she is using her rescue inhaler only when she is walking when weather changes. Per patient she is having severe pain in her eye. Patient has surgery scheduled for January.  Patient has agreed to follow up outreach calls.   Encounter Medications:  Outpatient Encounter Medications as of 11/16/2020  Medication Sig   albuterol (VENTOLIN HFA) 108 (90 Base) MCG/ACT inhaler Inhale 2 puffs into the lungs every 6 (six) hours as needed for wheezing or shortness of breath.   augmented betamethasone dipropionate (DIPROLENE-AF) 0.05 % ointment Apply topically 2 (two) times daily.   B-D ULTRA-FINE 33 LANCETS MISC Use as instructed to test sugars once  daily   budesonide-formoterol (SYMBICORT) 160-4.5 MCG/ACT inhaler Inhale 2 puffs into the lungs TWICE DAILY   carvedilol (COREG) 12.5 MG tablet Take 1 tablet (12.5 mg total) by mouth 2 (two) times daily with a meal.   Cholecalciferol (VITAMIN D3) 1.25 MG (50000 UT) CAPS Take 1 capsule by mouth every 30 (thirty) days.   clonazePAM (KLONOPIN) 0.5 MG tablet Take 1 TO 2 tablets (0.5-1 mg total) by mouth at bedtime as needed for anxiety.   Cyanocobalamin (VITAMIN B-12) 1000 MCG SUBL Place 1 tablet (1,000 mcg total) under the tongue daily.   dexlansoprazole (DEXILANT) 60 MG capsule Take 1 capsule (60 mg total) by mouth daily.   diphenoxylate-atropine (LOMOTIL) 2.5-0.025 MG tablet Take 1-2 tablets by mouth 4 (four) times daily as needed for diarrhea or loose stools.   diphenoxylate-atropine (LOMOTIL) 2.5-0.025 MG tablet Take 1 tablet by mouth 3 (three)  times daily as needed for diarrhea or loose stools.   furosemide (LASIX) 40 MG tablet TAKE 1 TABLET BY MOUTH EVERY MORNING FOR EDEMA   gabapentin (NEURONTIN) 300 MG capsule TAKE ONE CAPSULE BY MOUTH EVERY EVENING, MAY TAKE A SECOND CAPSULE DURING THE DAY AS NEEDED FOR PAIN   glucose blood (ACCU-CHEK GUIDE) test strip Use to check blood sugar once a day.   HUMIRA PEN 40 MG/0.4ML PNKT Inject 40 mg as directed every Wednesday.    hydrOXYzine (ATARAX/VISTARIL) 25 MG tablet Take 1 tablet (25 mg total) by mouth every 8 (eight) hours as needed.   lidocaine (LIDODERM) 5 % Place 1 patch onto the skin daily as needed. Apply patch to area most significant pain once per day.  Remove and discard patch within 12 hours of application.   metFORMIN (GLUCOPHAGE-XR) 500 MG 24 hr tablet Take 2 tablets (1,000 mg total) by mouth at bedtime.   methocarbamol (ROBAXIN) 500 MG tablet Take 1 tablet (500 mg total) by mouth every 8 (eight) hours as needed for muscle spasms.   Multiple Vitamin (MULTIVITAMIN WITH MINERALS) TABS tablet Take 1 tablet by mouth daily.   Multiple Vitamins-Minerals (MULTIVITAMIN WOMEN 50+) TABS Take 1 tablet by mouth daily.   naltrexone (DEPADE) 50 MG tablet Take 12.5 mg by mouth at bedtime.    neomycin-polymyxin b-dexamethasone (MAXITROL) 3.5-10000-0.1 SUSP Place 1 drop into the left eye 3 (three) times daily.   oxyCODONE-acetaminophen (PERCOCET) 10-325 MG tablet Take 1 tablet by mouth 4 (four) times daily as needed.   OXYGEN Inhale into  the lungs. Takes 2 liters every day (24/7)   potassium chloride (KLOR-CON) 10 MEQ tablet Take 1 tablet (10 mEq total) by mouth daily.   SUPREP BOWEL PREP KIT 17.5-3.13-1.6 GM/177ML SOLN SMARTSIG:1 Kit(s) By Mouth Once   Terbinafine HCl (LAMISIL AT SPRAY) 1 % SOLN Spray to both feet once daily at bedtime   topiramate (TOPAMAX) 50 MG tablet Take 1 tablet (50 mg total) by mouth 2 (two) times daily.   triamcinolone cream (KENALOG) 0.1 % Apply topically 2 (two) times  daily as needed.   UNABLE TO FIND Use as instructed to check blood sugar once a day.   venlafaxine XR (EFFEXOR-XR) 75 MG 24 hr capsule Take 1 capsule (75 mg total) by mouth daily with breakfast.   XARELTO 20 MG TABS tablet Take 1 tablet (20 mg total) by mouth at bedtime.   No facility-administered encounter medications on file as of 11/16/2020.    Functional Status:  In your present state of health, do you have any difficulty performing the following activities: 03/21/2020  Hearing? Y  Vision? N  Difficulty concentrating or making decisions? Y  Walking or climbing stairs? Y  Dressing or bathing? Y  Doing errands, shopping? N  Preparing Food and eating ? N  Using the Toilet? N  In the past six months, have you accidently leaked urine? N  Do you have problems with loss of bowel control? N  Managing your Medications? N  Managing your Finances? N  Housekeeping or managing your Housekeeping? Y  Some recent data might be hidden    Fall/Depression Screening: Fall Risk  11/16/2020 07/10/2020 04/05/2020  Falls in the past year? _0 Comment - - -  Number falls in past yr: 1 0 0  Injury with Fall? 1 0 0  Comment bruises and skin tear - -  Risk Factor Category  - - -  Risk for fall due to : History of fall(s) History of fall(s);Impaired balance/gait;Impaired mobility History of fall(s);Impaired balance/gait;Impaired mobility  Follow up Falls evaluation completed Falls evaluation completed Falls evaluation completed   PHQ 2/9 Scores 03/21/2020 02/02/2019 04/29/2018 04/29/2018 11/07/2017 10/06/2017 10/02/2017  PHQ - 2 Score _1 0 0  PHQ- 9 Score _2 - -    Care Plan Care Plan : COPD (Adult)  Updates made by Verlin Grills, RN since 11/16/2020 12:00 AM     Problem: Psychological Adjustment to Diagnosis (COPD)   Priority: Medium  Onset Date: 11/26/2019     Long-Range Goal: Adjustment to Disease Achieved   Start Date: 11/26/2019  Expected End Date: 03/16/2021  Recent  Progress: On track  Priority: Medium  Note:   Evidence-based guidance:  Explore the patient and family's understanding of the disease; use open-ended questions to encourage patient and family to share what is important to them.  Assess and provide support around experience of loss and lifestyle adjustments, such as loss of function, roles, social ties, independence and hobbies.  Support adjustment to ?onew normal? with focus on maintaining daily life as closely as possible to life before chronic obstructive pulmonary disease (COPD) diagnosis.  Express empathy; listen actively by encouraging the patient and family to express feelings, concerns and fears; ask questions and encourage open communication regarding embarrassing or disturbing topics.  Assess for factors that may impact coping or adjustment, such as a preexisting mental health condition, prognosis, lack of social support, debilitating disease or financial difficulty that include no or insufficient insurance coverage.  Assess and address fear or anxiety related to dyspnea or perceived stigma of a noninfectious cough.  Assess anxiety, feelings of panic when breathing becomes labored, as well as impact on family/caregiver; consider cognitive behavioral therapy, deep breathing, meditation, visualization, photo or light therapy.  Encourage advanced care planning discussion to clarify goals of care; palliative care or hospice may be considered for advanced COPD patients if it aligns with patient's goals of care.  Explore common risk factors for depression, such as personal or family history of depression, substance use and stressors that include missed work, losing ability to do what patient was used to doing, changes in appearance, physical   or sexual abuse.  Use a validated screening tool to identify level of suicide risk. If at risk, develop safety plan and implement additional safety measures based on level of risk, such as behavioral health  referral or immediate assessment.   Notes:     Problem: Disease Progression (COPD)   Priority: Medium  Onset Date: 11/26/2019     Long-Range Goal: Disease Progression Minimized or Managed   Start Date: 11/26/2019  Expected End Date: 04/12/2021  Recent Progress: On track  Priority: Medium  Note:   Evidence-based guidance:  Identify current smoking/tobacco use; provide smoking cessation intervention.  Assess symptom control by the frequency and type of symptoms, reliever use and activity limitation at every encounter.  Assess risk for exacerbation (flare up) by evaluating spirometry, pulse oximetry, reliever use, presentation of symptoms and activity limitation; anticipate treatment adjustment based on risks and resources.  Develop and/or review and reinforce use of COPD rescue (action) plan even when symptoms are controlled or infrequent.  Ask patient to bring inhaler to all visits; assess and reinforce correct technique; address barriers to proper inhaler use, such as older age, use of multiple devices and lack of understanding.   Identify symptom triggers, such as smoking, virus, weather change, emotional upset, exercise, obesity and environmental allergen; consider reduction of work-exposure versus elimination to avoid compromising employment.  Correlate presentation to comorbidity, such as diabetes, heart failure, obstructive sleep apnea, depression and anxiety, which may worsen symptoms.  Promote participation in pulmonary rehabilitation for breathing exercises, skills training, improved exercise capacity, mood and quality of life; address barriers to participation.  Promote physical activity or exercise to improve or maintain exercise capacity, based on tolerance that may include walking, water exercise, cycling or limb muscle strength training.  Promote use of energy conservation and activity pacing techniques.  Promote use of breathing and coughing techniques, such as inspiratory  muscle training, pursed-lip breathing, diaphragmatic breathing, pranayama yoga breathing or huff cough.  Screen for malnutrition risk factors, such as unintentional weight loss and poor oral intake; refer to dietitian if identified.  Screen for obstructive sleep apnea; prepare patient for polysomnography based on risk and presentation.  Prepare patient for use of long-term oxygen and noninvasive ventilation to relieve hypercapnia, hypoxemia, obstructive sleep apnea and reduce work of breathing.   Notes:     Problem: Symptom Exacerbation (COPD)   Priority: Medium  Onset Date: 11/26/2019     Long-Range Goal: Symptom Exacerbation Prevented or Minimized   Start Date: 11/26/2019  Expected End Date: 04/12/2021  Recent Progress: On track  Priority: High  Note:   Evidence-based guidance:  Monitor for signs of respiratory infection, including changes in sputum color, volume and thickness, as well as fever.  Encourage infection prevention strategies that may include prophylactic antibiotic therapy for patients with history of frequent exacerbations or antibiotic administration during exacerbation  based on presentation, risk and benefit.  Encourage receipt of influenza and pneumococcal vaccine.  Prepare patient for use of home long-term oxygen therapy in presence of sever resting hypoxemia.  Prepare patients for laboratory studies or diagnostic exams, such as spirometry, pulse oximetry and arterial blood gas based on current symptoms, risk factors and presentation.  Assess barriers and manage adherence, including inhaler technique and persistent trigger exposure; encourage adherence, even when symptoms are controlled or infrequent.  Assess and monitor for signs/symptoms of psychosocial concerns, such as shortness of breath-anxiety cycle or depression that may impact stability of symptoms.  Identify economic resources, sociocultural beliefs, social factors and health literacy that may interfere with  adherence.  Promote lifestyle changes when needed, including regular physical activity based on tolerance, weight loss, healthy eating and stress management.  Consider referral to nurse or community health worker or home-visiting program for intensive support and education (disease-management program).  Increase frequency of follow-up following exacerbation or hospitalization; consider transition of care interventions, such as hospital visit, home visit, telephone follow-up, review of discharge summary and resource referrals.   Notes:  [id number] Patient has received flu vaccine RN will schedule COVID vaccine No admissions for COPD exacerbation      Goals Addressed             This Visit's Progress    (THN)Eat Healthy   On track    Timeframe:  Long-Range Goal Priority:  Medium Start Date:      [id number]                       Expected End Date:    [id number]        Follow Up Date [id number]   - set goal weight - drink 6 to 8 glasses of water each day - manage portion size - set a realistic goal - switch to sugar-free drinks    Why is this important?   When you are ready to manage your nutrition or weight, having a plan and setting goals will help.  Taking small steps to change how you eat and exercise is a good place to start.    Notes:  Patient calls out to order food. Need to work on making healthy choices\ [id number] Patient is eating one meal a day [id number] Per patient her appetite is good. Her Blood sugars have not been over 130. Per patient adhering to diet (Diabetic, Low sodium)     (THN)Learn and Do Breathing Exercises   On track    Timeframe:  Long-Range Goal Priority:  Medium Start Date:  [id number]                           Expected End Date:  [id number]     Follow Up Date [id number]   - do exercises in a comfortable position that makes breathing as easy as possible    Why is this important?   Breathing exercises can help lessen the cough that comes with chronic  obstructive pulmonary disease.  Doing the exercises will give you more energy.  They will also help you to control your symptoms.    Notes:  [id number] Patient is able to do purse lip breathing. [id number] Patient is doing breathing exercises     (THN)Manage Fatigue (Tiredness)   On track    Timeframe:  Long-Range Goal Priority:  High Start Date:    [id number]  Expected End Date: [id number]              Follow Up Date [id number]   - eat healthy - use devices that will help like a cane, sock-puller or reacher    Why is this important?   Feeling tired or worn out is a common symptom of COPD (chronic obstructive pulmonary disease).  Learning when you feel your best and when you need rest is important.  Managing the tiredness (fatigue) will help you be active and enjoy life.     Notes:  Patient takes frequent times yo rest. She is on O2 continuously and gets short of breath with min exertion [id number] Patient is taking frequent rest periods when fatigued     (THN)Track and Manage My Symptoms   On track    Timeframe:  Long-Range Goal Priority:  Medium Start Date: [id number]                            Expected End Date:      79892119              Follow Up Date [id number]   - develop a rescue plan - eliminate symptom triggers at home - follow rescue plan if symptoms flare-up - keep follow-up appointments    Why is this important?   Tracking your symptoms and other information about your health helps your doctor plan your care.  Write down the symptoms, the time of day, what you were doing and what medicine you are taking.  You will soon learn how to manage your symptoms.     Notes:   07/10/2020 She is tracking and monitoring her symptoms [id number] Patient is monitoring her symptoms. She knows it is worse when in heat and cold     (THN)Track and Manage My Triggers   On track    Timeframe:  Long-Range Goal Priority:  Medium Start Date:     [id number]                         Expected End Date:  85885027              Follow Up Date [id number]  - avoid second hand smoke - limit outdoor activity during cold weather - listen for public air quality announcements every day    Why is this important?   Triggers are activities or things, like tobacco smoke or cold weather, that make your COPD (chronic obstructive pulmonary disease) flare-up.  Knowing these triggers helps you plan how to stay away from them.  When you cannot remove them, you can learn how to manage them.     Notes:  [id number] Patient is monitoring her symptoms for COPD exacerbation [id number] Patient is currently still smoking.  She is monitoring when to go outside. She is taking her medications as per ordered        Plan:  Follow-up: Follow-up in 3 month(s) RN sent COPD quick tips RN discussed COVID booster Patient will notify RN for arrangement of homebound COVID vaccine Patient will follow up with Eye surgery Patient will follow up with [facility] for transportation RN sent update assessment to PCP  [place] [facility] [phone]

## 2020-11-20 ENCOUNTER — Other Ambulatory Visit: Payer: Self-pay

## 2020-11-20 ENCOUNTER — Ambulatory Visit (INDEPENDENT_AMBULATORY_CARE_PROVIDER_SITE_OTHER): Payer: 59 | Admitting: Internal Medicine

## 2020-11-20 ENCOUNTER — Encounter: Payer: Self-pay | Admitting: Internal Medicine

## 2020-11-20 VITALS — BP 128/70 | HR 64 | Temp 98.3°F | Ht 67.0 in | Wt 262.3 lb

## 2020-11-20 DIAGNOSIS — L03211 Cellulitis of face: Secondary | ICD-10-CM

## 2020-11-20 DIAGNOSIS — M159 Polyosteoarthritis, unspecified: Secondary | ICD-10-CM | POA: Diagnosis not present

## 2020-11-20 DIAGNOSIS — M150 Primary generalized (osteo)arthritis: Secondary | ICD-10-CM

## 2020-11-20 DIAGNOSIS — H04302 Unspecified dacryocystitis of left lacrimal passage: Secondary | ICD-10-CM

## 2020-11-20 MED ORDER — DOXYCYCLINE HYCLATE 100 MG PO TABS: 100.0000 mg | ORAL_TABLET | Freq: Two times a day (BID) | ORAL | 1 refills | Status: DC

## 2020-11-20 MED ORDER — ERYTHROMYCIN 5 MG/GM OP OINT: 1.0000 "application " | TOPICAL_OINTMENT | Freq: Four times a day (QID) | OPHTHALMIC | 1 refills | Status: DC

## 2020-11-20 NOTE — Assessment & Plan Note (Signed)
Doxy x 2 wks Erythro qid - L eye Appt w/dr Gavin Pound ASAP

## 2020-11-20 NOTE — Assessment & Plan Note (Signed)
On Oxy prn   Potential benefits of a long term opioids use as well as potential risks (i.e. addiction risk, apnea etc) and complications (i.e. Somnolence, constipation and others) were explained to the patient and were aknowledged.
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
## 2020-11-21 ENCOUNTER — Encounter: Payer: Self-pay | Admitting: Internal Medicine

## 2020-11-27 ENCOUNTER — Telehealth (INDEPENDENT_AMBULATORY_CARE_PROVIDER_SITE_OTHER): Payer: 59 | Admitting: Family Medicine

## 2020-11-27 ENCOUNTER — Other Ambulatory Visit: Payer: Self-pay

## 2020-11-27 ENCOUNTER — Encounter: Payer: Self-pay | Admitting: Family Medicine

## 2020-11-27 VITALS — Ht 67.0 in

## 2020-11-27 DIAGNOSIS — H0469 Other changes of lacrimal passages: Secondary | ICD-10-CM | POA: Diagnosis not present

## 2020-11-27 DIAGNOSIS — R519 Headache, unspecified: Secondary | ICD-10-CM

## 2020-11-27 DIAGNOSIS — H04302 Unspecified dacryocystitis of left lacrimal passage: Secondary | ICD-10-CM

## 2020-11-27 MED ORDER — AMOXICILLIN-POT CLAVULANATE 875-125 MG PO TABS: 1.0000 | ORAL_TABLET | Freq: Two times a day (BID) | ORAL | 0 refills | Status: DC

## 2020-11-27 NOTE — Progress Notes (Signed)
Virtual Visit via Video Note I connected with [name] on 11/27/20 by a video enabled telemedicine application and verified that I am speaking with the correct person using two identifiers.  Location patient: home Location provider:work office Persons participating in the virtual visit: patient, provider  I discussed the limitations of evaluation and management by telemedicine and the availability of in person appointments. The patient expressed understanding and agreed to proceed.  Chief Complaint  Patient presents with   Eye Problem    Bump on left eye, infected tear duct. Has been on oral abx & ointment.   HPI: [name] is a 60 yo female with hx of sarcoidosis, CAD,COPD,HFpEF, and migraines c/o worsening left-sided eye cyst and headache. Denies fever,chills,decreased appetite, or sore throat. She has been evaluated by ophthalmologist, Dr. Ouida Sills, on 10/17/20.  Surgical treatment was recommended, scheduled for 01/18/2021. She is very concerned because lesion is getting bigger, leaking a yellowish and serosanguineous drainage. Lesion interfering with visual field. Reports conjunctival erythema and gum tenderness.  Evaluated by PCP, doxycycline and topical azithromycin recommended. She discontinued doxycycline after 4 days of treatment due to diarrhea and abdominal cramps. States that she cannot apply topical ointment in eye because of size of lesion. She has had similar problem years ago on right side.  2 weeks of "very bad" headache, left-sided occipital and frontal. No associated nausea or vomiting. Negative for focal weakness or MS changes.  History of migraines. Currently she is on topiramate 50 mg twice daily, she does not think medication is helping. Evaluated by neurologist in 10/2018.  Orbit CT done on 10/17/20: 1.  Heterogeneous mass lesion centered in the left medial canthus with associated expansion of the left naso lacrimal duct with associated  mild periorbital soft tissue stranding without evidence of post septal involvement. 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 Evaluation of associated infection is limited given the lack of contrast. 2.  Similar smaller mass in the right medial canthus and associated nasal arch bony loss, which may be related to previously treated dacrocystitis. ROS: See pertinent positives and negatives per HPI.  Past Medical History:  Diagnosis Date   Allergic rhinitis    ALLERGIC RHINITIS 10/26/2009   Anxiety    ANXIETY 08/14/2006   B12 DEFICIENCY 9/83/3825   Complication of anesthesia    pt has had difficulty following anesthesia with her knee in 2016-unable to care for herself afterward   Confusion    Depression    takes Lexapro daily   Diabetes mellitus without complication ([place])    was on insulin but has been off since Nov 2015 and now only takes Metformin daily   DYSPNEA 04/28/2009   with exertion   Esophageal reflux    takes Nexium daily   Fibromyalgia    Headache    last migraine 2-64yrs ago;takes Topamax daily   History of shingles    Hypertension    takes Coreg daily   Insomnia    takes Nortriptyline nightly    Joint pain    Joint swelling    Left knee pain    Lichen planus    Long-term memory loss    Nocturia    OSA (obstructive sleep apnea)    doesn't use CPAP;sleep study in epic from 2006   Osteoarthritis    Osteoarthritis    Pneumonia    over 30 yrs ago   PONV (postoperative nausea and vomiting)    Protein calorie malnutrition (HCC)    Rheumatoid arthritis ([place])    Sarcoidosis  Dr. Lolita Patella   Short-term memory loss    Shortness of breath    Sleep apnea     wears oxygen   TIA (transient ischemic attack)    Unsteady gait    Urinary urgency    Vitamin D deficiency    is supposed to take Vit D but can't afford it   VITAMIN D DEFICIENCY 08/25/2007    Past Surgical History:  Procedure Laterality Date   APPENDECTOMY     arthroscopic knee surgery Right 11-12-04    AXILLARY ABCESS IRRIGATION AND DEBRIDEMENT  Jul & Aug2012   BIOPSY  11/02/2019   Procedure: BIOPSY;  Surgeon: Jerene Bears, MD;  Location: [facility];  Service: Gastroenterology;;   CARPAL TUNNEL RELEASE Left 05/23/2014   Procedure: CARPAL TUNNEL RELEASE;  Surgeon: Meredith Pel, MD;  Location: [facility];  Service: Orthopedics;  Laterality: Left;   CHOLECYSTECTOMY N/A 02/11/2018   Procedure: LAPAROSCOPIC CHOLECYSTECTOMY WITH INTRAOPERATIVE CHOLANGIOGRAM ERAS PATHWAY;  Surgeon: Donnie Mesa, MD;  Location: [facility];  Service: General;  Laterality: N/A;   COLONOSCOPY WITH PROPOFOL N/A 11/02/2019   Procedure: COLONOSCOPY WITH PROPOFOL;  Surgeon: Jerene Bears, MD;  Location: [facility];  Service: Gastroenterology;  Laterality: N/A;   cyst removed from top of buttocks  at age 46   ENDOMETRIAL ABLATION     IR CHOLANGIOGRAM EXISTING TUBE  11/21/2017   IR CHOLANGIOGRAM EXISTING TUBE  01/16/2018   IR EXCHANGE BILIARY DRAIN  11/10/2017   IR PATIENT EVAL TECH 0-60 MINS  02/03/2018   IR PERC CHOLECYSTOSTOMY  09/13/2017   IR RADIOLOGIST EVAL & MGMT  10/08/2017   LACRIMAL DUCT EXPLORATION Right 06/26/2017   Procedure: LACRIMAL DUCT EXPLORATION AND ETHMOIDECTOMY;  Surgeon: Clista Bernhardt, MD;  Location: [place];  Service: Ophthalmology;  Laterality: Right;   POLYPECTOMY  11/02/2019   Procedure: POLYPECTOMY;  Surgeon: Jerene Bears, MD;  Location: [facility];  Service: Gastroenterology;;   TEAR DUCT PROBING Right 06/26/2017   Procedure: TEAR DUCT PROBING WITH STENT;  Surgeon: Clista Bernhardt, MD;  Location: [place];  Service: Ophthalmology;  Laterality: Right;   TOTAL KNEE ARTHROPLASTY Right 11/15/2014   Procedure: TOTAL RIGHT KNEE ARTHROPLASTY;  Surgeon: Meredith Pel, MD;  Location: [place];  Service: Orthopedics;  Laterality: Right;   TOTAL KNEE ARTHROPLASTY Left 07/13/2015   Procedure: LEFT TOTAL KNEE ARTHROPLASTY;  Surgeon: Meredith Pel, MD;  Location: [facility];  Service: Orthopedics;   Laterality: Left;    Family History  Problem Relation Age of Onset   Heart disease Mother    Kidney disease Mother        renal failure   Cancer Father        leukemia   Hypertension Other    Coronary artery disease Other        female 1st degree relative <60   Heart failure Other        congestive   Coronary artery disease Other        Female 1st degree relative <50   Breast cancer Other        1st degree relative <50 S   Breast cancer Sister    Colon cancer Brother 15   Stroke Brother    Heart attack Brother    Cancer Brother 31       colon ca   Esophageal cancer Neg Hx    Stomach cancer Neg Hx    Rectal cancer Neg Hx     Social History  Socioeconomic History   Marital status: Single    Spouse name: Not on file   Number of children: 1   Years of education: Not on file   Highest education level: Not on file  Occupational History   Occupation: disabled  Tobacco Use   Smoking status: Former    Packs/day: 0.50    Years: 10.00    Pack years: 5.00    Types: Cigarettes    Start date: 80    Quit date: 2004    Years since quitting: 18.8   Smokeless tobacco: Never   Tobacco comments:    quit smoking in 2004  Vaping Use   Vaping Use: Never used  Substance and Sexual Activity   Alcohol use: Not Currently    Alcohol/week: 1.0 standard drink    Types: 1 Glasses of wine per week   Drug use: No   Sexual activity: Not Currently  Other Topics Concern   Not on file  Social History Narrative   Single, broke up with partner in 2008.      Lives at home alone   Caffeine: stopped 2009   Social Determinants of Health   Financial Resource Strain: Low Risk    Difficulty of Paying Living Expenses: Not hard at all  Food Insecurity: No Food Insecurity   Worried About Charity fundraiser in the Last Year: Never true   Arboriculturist in the Last Year: Never true  Transportation Needs: Public librarian (Medical): Yes   Lack of  Transportation (Non-Medical): Yes  Physical Activity: Inactive   Days of Exercise per Week: 0 days   Minutes of Exercise per Session: 0 min  Stress: No Stress Concern Present   Feeling of Stress : Not at all  Social Connections: Socially Isolated   Frequency of Communication with Friends and Family: More than three times a week   Frequency of Social Gatherings with Friends and Family: Never   Attends Religious Services: Never   Marine scientist or Organizations: No   Attends Music therapist: Never   Marital Status: Never married  Human resources officer Violence: Not on file   Current Outpatient Medications:    albuterol (VENTOLIN HFA) 108 (90 Base) MCG/ACT inhaler, Inhale 2 puffs into the lungs every 6 (six) hours as needed for wheezing or shortness of breath., Disp: 8.5 g, Rfl: 3   augmented betamethasone dipropionate (DIPROLENE-AF) 0.05 % ointment, Apply topically 2 (two) times daily., Disp: , Rfl:    B-D ULTRA-FINE 33 LANCETS MISC, Use as instructed to test sugars once  daily, Disp: 100 each, Rfl: 12   budesonide-formoterol (SYMBICORT) 160-4.5 MCG/ACT inhaler, Inhale 2 puffs into the lungs TWICE DAILY, Disp: 10.2 g, Rfl: 5   carvedilol (COREG) 12.5 MG tablet, Take 1 tablet (12.5 mg total) by mouth 2 (two) times daily with a meal., Disp: 180 tablet, Rfl: 2   Cholecalciferol (VITAMIN D3) 1.25 MG (50000 UT) CAPS, Take 1 capsule by mouth every 30 (thirty) days., Disp: 3 capsule, Rfl: 3   clonazePAM (KLONOPIN) 0.5 MG tablet, Take 1 TO 2 tablets (0.5-1 mg total) by mouth at bedtime as needed for anxiety., Disp: 180 tablet, Rfl: 1   Cyanocobalamin (VITAMIN B-12) 1000 MCG SUBL, Place 1 tablet (1,000 mcg total) under the tongue daily., Disp: 100 tablet, Rfl: 3   dexlansoprazole (DEXILANT) 60 MG capsule, Take 1 capsule (60 mg total) by mouth daily., Disp: 90 capsule, Rfl: 3   diphenoxylate-atropine (LOMOTIL)  2.5-0.025 MG tablet, Take 1-2 tablets by mouth 4 (four) times daily as needed  for diarrhea or loose stools., Disp: 60 tablet, Rfl: 0   diphenoxylate-atropine (LOMOTIL) 2.5-0.025 MG tablet, Take 1 tablet by mouth 3 (three) times daily as needed for diarrhea or loose stools., Disp: 60 tablet, Rfl: 3   doxycycline (VIBRA-TABS) 100 MG tablet, Take 1 tablet (100 mg total) by mouth 2 (two) times daily., Disp: 28 tablet, Rfl: 1   erythromycin ophthalmic ointment, Place 1 application into the left eye 4 (four) times daily., Disp: 3.5 g, Rfl: 1   furosemide (LASIX) 40 MG tablet, TAKE 1 TABLET BY MOUTH EVERY MORNING FOR EDEMA, Disp: 90 tablet, Rfl: 1   gabapentin (NEURONTIN) 300 MG capsule, TAKE ONE CAPSULE BY MOUTH EVERY EVENING, MAY TAKE A SECOND CAPSULE DURING THE DAY AS NEEDED FOR PAIN, Disp: 180 capsule, Rfl: 1   glucose blood (ACCU-CHEK GUIDE) test strip, Use to check blood sugar once a day., Disp: 100 each, Rfl: 12   HUMIRA PEN 40 MG/0.4ML PNKT, Inject 40 mg as directed every Wednesday. , Disp: , Rfl:    hydrOXYzine (ATARAX/VISTARIL) 25 MG tablet, Take 1 tablet (25 mg total) by mouth every 8 (eight) hours as needed., Disp: 60 tablet, Rfl: 3   lidocaine (LIDODERM) 5 %, Place 1 patch onto the skin daily as needed. Apply patch to area most significant pain once per day.  Remove and discard patch within 12 hours of application., Disp: 30 patch, Rfl: 0   metFORMIN (GLUCOPHAGE-XR) 500 MG 24 hr tablet, Take 2 tablets (1,000 mg total) by mouth at bedtime., Disp: 180 tablet, Rfl: 2   methocarbamol (ROBAXIN) 500 MG tablet, Take 1 tablet (500 mg total) by mouth every 8 (eight) hours as needed for muscle spasms., Disp: 90 tablet, Rfl: 0   Multiple Vitamins-Minerals (MULTIVITAMIN WOMEN 50+) TABS, Take 1 tablet by mouth daily., Disp: , Rfl:    naltrexone (DEPADE) 50 MG tablet, Take 12.5 mg by mouth at bedtime. , Disp: , Rfl:    neomycin-polymyxin b-dexamethasone (MAXITROL) 3.5-10000-0.1 SUSP, Place 1 drop into the left eye 3 (three) times daily., Disp: , Rfl:    oxyCODONE-acetaminophen  (PERCOCET) 10-325 MG tablet, Take 1 tablet by mouth 4 (four) times daily as needed., Disp: , Rfl:    OXYGEN, Inhale into the lungs. Takes 2 liters every day (24/7), Disp: , Rfl:    potassium chloride (KLOR-CON) 10 MEQ tablet, Take 1 tablet (10 mEq total) by mouth daily., Disp: 90 tablet, Rfl: 3   Terbinafine HCl (LAMISIL AT SPRAY) 1 % SOLN, Spray to both feet once daily at bedtime, Disp: 125 mL, Rfl: 1   topiramate (TOPAMAX) 50 MG tablet, Take 1 tablet (50 mg total) by mouth 2 (two) times daily., Disp: 180 tablet, Rfl: 3   UNABLE TO FIND, Use as instructed to check blood sugar once a day., Disp: , Rfl:    venlafaxine XR (EFFEXOR-XR) 75 MG 24 hr capsule, Take 1 capsule (75 mg total) by mouth daily with breakfast., Disp: 30 capsule, Rfl: 11   XARELTO 20 MG TABS tablet, Take 1 tablet (20 mg total) by mouth at bedtime., Disp: 90 tablet, Rfl: 2  EXAM:  VITALS per patient if applicable:Ht 5\' 7"  (1.702 m)   LMP 05/19/2003   BMI 41.08 kg/m   GENERAL: alert, oriented, appears well and in no acute distress  HEENT: atraumatic, conjunctiva clear, no obvious abnormalities on inspection. Nodular lesion on left medial canthus, about 2 cm max diameter. I do  not appreciate purulent discharge but rather clear. See picture. No conjunctival erythema or drainage. There is no periocular edema or erythema and EOM seem intact.    NECK: normal movements of the head and neck  LUNGS: on inspection no signs of respiratory distress, breathing rate appears normal, no obvious gross SOB, gasping or wheezing  CV: no obvious cyanosis  MS: moves all visible extremities without noticeable abnormality  PSYCH/NEURO: pleasant and cooperative, no obvious depression, + anxious.Speech and thought processing grossly intact  ASSESSMENT AND PLAN:  Discussed the following assessment and plan:  Dacrocystitis, left We discussed possible complications. We reviewed some side effects of antibiotic, recommend a daily probiotic  (Align 1 caps daily). Augmentin 875-125 mg bid x 10 days. Local heat a few times throughout the day. She was clearly instructed about warning signs. Follow-up in 48 to 72 hours.  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  Headache, unspecified headache type Hx of migraines. Problem could be aggravated by dacryocystocele. Reporting that topiramate is not helping, for now recommend continue medication. I do not think head imaging needed at this time. Clearly instructed about warning signs Follow-up with PCP, she may need to see neurologist again.   We discussed possible serious and likely etiologies, options for evaluation and workup, limitations of telemedicine visit vs in person visit, treatment, treatment risks and precautions. The patient was advised to call back or seek an in-person evaluation if the symptoms worsen or if the condition fails to improve as anticipated. I discussed the assessment and treatment plan with the patient.She was provided an opportunity to ask questions and all were answered. The patient agreed with the plan and demonstrated an understanding of the instructions.  Return in about 3 days (around 11/30/2020).  Betty G. Martinique, MD  [facility]. [place] office.

## 2020-11-30 ENCOUNTER — Encounter: Payer: Self-pay | Admitting: Internal Medicine

## 2020-11-30 ENCOUNTER — Ambulatory Visit (INDEPENDENT_AMBULATORY_CARE_PROVIDER_SITE_OTHER): Payer: 59 | Admitting: Internal Medicine

## 2020-11-30 ENCOUNTER — Other Ambulatory Visit: Payer: Self-pay

## 2020-11-30 DIAGNOSIS — L03211 Cellulitis of face: Secondary | ICD-10-CM

## 2020-11-30 DIAGNOSIS — H04302 Unspecified dacryocystitis of left lacrimal passage: Secondary | ICD-10-CM

## 2020-11-30 MED ORDER — NUZYRA 150 MG PO TABS: ORAL_TABLET | ORAL | 0 refills | Status: DC

## 2020-11-30 MED ORDER — AMOXICILLIN-POT CLAVULANATE 875-125 MG PO TABS: 1.0000 | ORAL_TABLET | Freq: Two times a day (BID) | ORAL | 0 refills | Status: DC

## 2020-11-30 NOTE — Assessment & Plan Note (Addendum)
Refractory Will start on Nuzyra D/c Augmentin
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
## 2020-11-30 NOTE — Assessment & Plan Note (Addendum)
  Ophth appt is pending R/o skin cancer Refractory sx's Will start on Nuzyra D/c Augmentin

## 2020-12-06 ENCOUNTER — Telehealth: Payer: Self-pay | Admitting: *Deleted

## 2020-12-06 NOTE — Unmapped (Signed)
[facility] Refill Coordination Note    Carolyn Newton reports she currently has a cyst on her eyelid and is awaiting surgery to have it removed.    Specialty Medication(s) to be Shipped:   Inflammatory Disorders: Humira    Other medication(s) to be shipped: No additional medications requested for fill at this time     Carolyn Newton, DOB: 07-15-1960  Phone: [phone] (home)       All above HIPAA information was verified with patient.     Was a Nurse, learning disability used for this call? No    Completed refill call assessment today to schedule patient's medication shipment from the [facility] [phone]).  All relevant notes have been reviewed.     Specialty medication(s) and dose(s) confirmed: Regimen is correct and unchanged.   Changes to medications: Carolyn Newton reports no changes at this time.  Changes to insurance: No  New side effects reported not previously addressed with a pharmacist or physician: None reported  Questions for the pharmacist: No    Confirmed patient received a Conservation officer, historic buildings and a Surveyor, mining with first shipment. The patient will receive a drug information handout for each medication shipped and additional FDA Medication Guides as required.       DISEASE/MEDICATION-SPECIFIC INFORMATION        For patients on injectable medications: Patient currently has 2 doses left.  Next injection is scheduled for 12/13/2020.     SPECIALTY MEDICATION ADHERENCE     Medication Adherence    Patient reported X missed doses in the last month: 0  Specialty Medication: Humira CF 40 mg/0.4 ml  Patient is on additional specialty medications: No  Any gaps in refill history greater than 2 weeks in the last 3 months: no  Demonstrates understanding of importance of adherence: yes  Informant: patient  Reliability of informant: reliable  Confirmed plan for next specialty medication refill: delivery by pharmacy  Refills needed for supportive medications: not needed        Were doses missed due to medication being on hold? No    REFERRAL TO PHARMACIST     Referral to the pharmacist: Not needed      [facility]     Shipping address confirmed in Epic.     Delivery Scheduled: Yes, Expected medication delivery date: 12/14/2020.     Medication will be delivered via UPS to the prescription address in Epic WAM.    Carolyn Newton   [facility] Specialty Technician

## 2020-12-06 NOTE — Telephone Encounter (Signed)
Rec'd msg stating ' Request Reference Number: [id number]. NUZYRA TAB 150MG  is approved through 01/13/2022.".Marland KitchenJohny Chess
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
## 2020-12-13 DIAGNOSIS — M47817 Spondylosis without myelopathy or radiculopathy, lumbosacral region: Secondary | ICD-10-CM | POA: Insufficient documentation

## 2020-12-13 MED FILL — HUMIRA PEN CITRATE FREE 40 MG/0.4 ML: SUBCUTANEOUS | 28 days supply | Qty: 4 | Fill #7

## 2020-12-14 ENCOUNTER — Ambulatory Visit: Payer: 59 | Admitting: Sports Medicine

## 2021-01-03 NOTE — Unmapped (Signed)
[facility] Refill Coordination Note    Specialty Medication(s) to be Shipped:   Inflammatory Disorders: Humira    Other medication(s) to be shipped: No additional medications requested for fill at this time     Carolyn Newton, DOB: Oct 23, 1960  Phone: [phone] (home)       All above HIPAA information was verified with patient.     Was a Nurse, learning disability used for this call? No    Completed refill call assessment today to schedule patient's medication shipment from the [facility] [phone]).  All relevant notes have been reviewed.     Specialty medication(s) and dose(s) confirmed: Regimen is correct and unchanged.   Changes to medications: Kwanza reports no changes at this time.  Changes to insurance: No  New side effects reported not previously addressed with a pharmacist or physician: None reported  Questions for the pharmacist: No    Confirmed patient received a Conservation officer, historic buildings and a Surveyor, mining with first shipment. The patient will receive a drug information handout for each medication shipped and additional FDA Medication Guides as required.       DISEASE/MEDICATION-SPECIFIC INFORMATION        For patients on injectable medications: Patient currently has 2 doses left.  Next injection is scheduled for 01/10/2021.     SPECIALTY MEDICATION ADHERENCE     Medication Adherence    Patient reported X missed doses in the last month: 0  Specialty Medication: Humira  Patient is on additional specialty medications: No  Any gaps in refill history greater than 2 weeks in the last 3 months: no  Demonstrates understanding of importance of adherence: yes  Informant: patient  Reliability of informant: reliable  Confirmed plan for next specialty medication refill: delivery by pharmacy  Refills needed for supportive medications: not needed        Were doses missed due to medication being on hold? No    REFERRAL TO PHARMACIST     Referral to the pharmacist: Not needed      [facility]     Shipping address confirmed in Epic.     Delivery Scheduled: Yes, Expected medication delivery date: 01/11/2021.     Medication will be delivered via UPS to the prescription address in Epic WAM.    Maxine Fredman D Caelen Higinbotham   [facility] Specialty Technician

## 2021-01-05 DIAGNOSIS — D869 Sarcoidosis, unspecified: Principal | ICD-10-CM

## 2021-01-10 MED FILL — HUMIRA PEN CITRATE FREE 40 MG/0.4 ML: SUBCUTANEOUS | 28 days supply | Qty: 4 | Fill #8

## 2021-01-16 ENCOUNTER — Other Ambulatory Visit: Payer: Self-pay | Admitting: Pulmonary Disease

## 2021-01-25 ENCOUNTER — Ambulatory Visit: Payer: 59 | Admitting: Adult Health

## 2021-01-29 ENCOUNTER — Other Ambulatory Visit: Payer: Self-pay | Admitting: Internal Medicine

## 2021-02-01 ENCOUNTER — Ambulatory Visit (INDEPENDENT_AMBULATORY_CARE_PROVIDER_SITE_OTHER): Payer: Commercial Managed Care - HMO

## 2021-02-01 ENCOUNTER — Ambulatory Visit (INDEPENDENT_AMBULATORY_CARE_PROVIDER_SITE_OTHER): Payer: Commercial Managed Care - HMO | Admitting: Adult Health

## 2021-02-01 ENCOUNTER — Other Ambulatory Visit: Payer: Self-pay

## 2021-02-01 ENCOUNTER — Encounter: Payer: Self-pay | Admitting: Adult Health

## 2021-02-01 VITALS — BP 106/90 | HR 69 | Temp 98.3°F | Ht 67.0 in | Wt 270.4 lb

## 2021-02-01 DIAGNOSIS — I700 Atherosclerosis of aorta: Secondary | ICD-10-CM | POA: Diagnosis not present

## 2021-02-01 DIAGNOSIS — I2782 Chronic pulmonary embolism: Secondary | ICD-10-CM | POA: Diagnosis not present

## 2021-02-01 DIAGNOSIS — D869 Sarcoidosis, unspecified: Secondary | ICD-10-CM

## 2021-02-01 DIAGNOSIS — J449 Chronic obstructive pulmonary disease, unspecified: Secondary | ICD-10-CM

## 2021-02-01 DIAGNOSIS — M47814 Spondylosis without myelopathy or radiculopathy, thoracic region: Secondary | ICD-10-CM | POA: Diagnosis not present

## 2021-02-01 DIAGNOSIS — R5381 Other malaise: Secondary | ICD-10-CM

## 2021-02-01 NOTE — Patient Instructions (Addendum)
Chest xray.  Continue on Symbicort Twice daily  . Rinse after use.  Albuterol inhaler as needed Mucinex DM Twice daily  As needed  Cough/congestion  Continue on Oxygen 2l/m .  Activity as tolerated.  Continue on Xarelto .  Work on healthy weight loss.  Follow up with Dr. Marlou Sa regarding right knee issues.  Covid booster as discussed.  Follow up Dr. Elsworth Soho in 6  months and As needed   Please contact office for sooner follow up if symptoms do not improve or worsen or seek emergency care

## 2021-02-01 NOTE — Assessment & Plan Note (Signed)
Chest x-ray 2022 showed no parenchymal disease.  Calcified lymph nodes. 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  Continue to follow.  Chest x-ray today.

## 2021-02-01 NOTE — Assessment & Plan Note (Signed)
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  Continues to have progressive/chronic shortness of breath with decreased activity tolerance. Check chest x-ray today.  Plan  Patient Instructions  Chest xray.  Continue on Symbicort Twice daily  . Rinse after use.  Albuterol inhaler as needed Mucinex DM Twice daily  As needed  Cough/congestion  Continue on Oxygen 2l/m .  Activity as tolerated.  Continue on Xarelto .  Work on healthy weight loss.  Follow up with Dr. Marlou Sa regarding right knee issues.  Covid booster as discussed.  Follow up Dr. Elsworth Soho in 6  months and As needed   Please contact office for sooner follow up if symptoms do not improve or worsen or seek emergency care

## 2021-02-01 NOTE — Assessment & Plan Note (Signed)
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  Continue on Xarelto.  Patient education given

## 2021-02-01 NOTE — Progress Notes (Signed)
@Patient  ID: [name], female    DOB: [birth year], 61 y.o.   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  History of chronic respiratory failure on oxygen at 2 L. sleep apnea history but CPAP intolerant  TEST/EVENTS :  2018 started on Humira for reoccurring cutaneous lesions not responding to methotrexate.  Previously on prednisone but tapered off.   Admitted 07/21/2016 for RLE DVT-had stopped Xarelto 04/2016 for prior pulmonary embolism   CT scan = chronic appearing subsegmental PE , on Xarelto lifelong    PFT 05/2014 >> FVC 65%, ratio 69, FEV1 58%, DLCO 62%   VQ Scan 08/17/15 >> LLL & RUL acute PE    ECHO 08/18/15 >> mild LVH , EF 55-60%, Gr 1 DD .    LE Venous Doppler 08/18/15 >> neg DVT , no bakers cyst, ?prominent inguinal lymph node measuring 3.1cm    MRI brain neg [facility] = MRI spine = mild to moderate disc protrusion/left-sided spinal stenosis-chronic pain   NPSG 12/2004 mild, RDI 13/h   SARS-CoV-2 July 23, 2018- negative   CT chest July 2020 showed stable sarcoid to mildly decreased.(Nodular opacities (stable mediastinal and bilateral hilar adenopathy  decreased.(Nodular opacities (stable mediastinal and bilateral hilar adenopathy.    PFTs done in July 2020 showed stable lung function with FEV1 at 70%, ratio 81, FVC 67% and DLCO was 61%.  This was similar to 2016  02/01/2021 Follow up ; Sarcoid , O2 RF  Patient returns for a 65-month follow-up.  Patient has underlying pulmonary is cutaneous sarcoidosis.  She remains on Symbicort twice daily.  She says she still gets short of breath with minimal activity.  Has intermittent cough. Has low activity tolearance.  She  remains on Humira for a cutaneous sarcoid.  Says it is really been working well. Rash on face and feet much better.  She is on oxygen 2 L.  Denies any increased oxygen demands. Chest x-ray May 2022 showed clear lungs.  And calcified hilar and mediastinal nodes. Influenza and COVID vaccines are up-to-date Patient has a history of PE on lifelong anticoagulation with Xarelto.  Denies any known bleeding.  Says being having issues with right knee instability , has fallen x 2. Follows with Dr. Marlou Sa for ortho.  Also has left eye cyst going to have to have surgery.   Has chronic back pain, narcotics.  Has lots of joint complaints. Frustrated with all the pain.   Flu shot , covid vaccine x 3 , discussed 2nd booster.   Allergies  Allergen Reactions   Belsomra [Suvorexant] Other (See Comments)    [place] out of bed while asleep: "I'm waking up as I'm falling on the floor;" "Night terrors"   Enalapril Maleate Cough   Latex Hives   Nickel Other (See Comments)    Blisters  Pt has a titanium right and left knee - nickel causes skin irritations that form into blisters and sores   Other Other (See Comments)    Patient has Sarcoidosis and can't tolerate any metals   Morphine And Related Itching and Nausea Only   Doxycycline Diarrhea and Nausea And Vomiting   Hydrochlorothiazide Other (See Comments)    Low potassium levels    Hydroxychloroquine Sulfate Other (  See Comments)    Vision changes    Lyrica [Pregabalin] Other (See Comments)    "Made me feel high"   Tape Other (See Comments)    Medical tape causes bruising    Immunization History  Administered Date(s) Administered   H1N1 12/24/2007   Influenza Split 10/11/2010   Influenza Whole 10/14/2007, 10/05/2008, 10/26/2009, 11/13/2011   Influenza,inj,Quad PF,6+ Mos 09/21/2012, 09/24/2013, 10/06/2014, 09/05/2015, 11/06/2016, 09/24/2017, 10/07/2018, 11/15/2019, 10/13/2020   Influenza,inj,Quad PF,6-35 Mos 09/14/2020   PFIZER(Purple Top)SARS-COV-2  Vaccination 03/31/2019, 04/21/2019, 11/27/2019   PNEUMOCOCCAL CONJUGATE-20 07/19/2020   PPD Test 11/18/2014   Pneumococcal Conjugate-13 07/30/2010, 12/29/2012   Pneumococcal Polysaccharide-23 12/23/2013   Tdap 06/28/2014    Past Medical History:  Diagnosis Date   Allergic rhinitis    ALLERGIC RHINITIS 10/26/2009   Anxiety    ANXIETY 08/14/2006   B12 DEFICIENCY 07/10/348   Complication of anesthesia    pt has had difficulty following anesthesia with her knee in 2016-unable to care for herself afterward   Confusion    Depression    takes Lexapro daily   Diabetes mellitus without complication ([place])    was on insulin but has been off since Nov 2015 and now only takes Metformin daily   DYSPNEA 04/28/2009   with exertion   Esophageal reflux    takes Nexium daily   Fibromyalgia    Headache    last migraine 2-39yrs ago;takes Topamax daily   History of shingles    Hypertension    takes Coreg daily   Insomnia    takes Nortriptyline nightly    Joint pain    Joint swelling    Left knee pain    Lichen planus    Long-term memory loss    Nocturia    OSA (obstructive sleep apnea)    doesn't use CPAP;sleep study in epic from 2006   Osteoarthritis    Osteoarthritis    Pneumonia    over 30 yrs ago   PONV (postoperative nausea and vomiting)    Protein calorie malnutrition (HCC)    Rheumatoid arthritis ([place])    Sarcoidosis    Dr. Lolita Patella   Short-term memory loss    Shortness of breath    Sleep apnea     wears oxygen   TIA (transient ischemic attack)    Unsteady gait    Urinary urgency    Vitamin D deficiency    is supposed to take Vit D but can't afford it   VITAMIN D DEFICIENCY 08/25/2007    Tobacco History: Social History   Tobacco Use  Smoking Status Former   Packs/day: 0.50   Years: 10.00   Pack years: 5.00   Types: Cigarettes   Start date: 1988   Quit date: 2004   Years since quitting: 19.0  Smokeless Tobacco Never  Tobacco Comments   quit smoking in 2004    Counseling given: Not Answered Tobacco comments: quit smoking in 2004   Outpatient Medications Prior to Visit  Medication Sig Dispense Refill   albuterol (VENTOLIN HFA) 108 (90 Base) MCG/ACT inhaler Inhale 2 puffs into the lungs every 6 (six) hours as needed for wheezing or shortness of breath. 8.5 g 3   B-D ULTRA-FINE 33 LANCETS MISC Use as instructed to test sugars once  daily 100 each 12   budesonide-formoterol (SYMBICORT) 160-4.5 MCG/ACT inhaler Inhale 2 puffs into the lungs TWICE DAILY 10.2 g 5   carvedilol (COREG) 12.5 MG tablet Take 1 tablet (12.5 mg total) by mouth 2 (two) times daily  with a meal. 180 tablet 2   Cholecalciferol (VITAMIN D3) 1.25 MG (50000 UT) CAPS Take 1 capsule by mouth every 30 (thirty) days. 3 capsule 3   clonazePAM (KLONOPIN) 0.5 MG tablet Take 1 TO 2 tablets (0.5-1 mg total) by mouth at bedtime as needed for anxiety. 180 tablet 1   Cyanocobalamin (VITAMIN B-12) 1000 MCG SUBL Place 1 tablet (1,000 mcg total) under the tongue daily. 100 tablet 3   dexlansoprazole (DEXILANT) 60 MG capsule Take 1 capsule (60 mg total) by mouth daily. 90 capsule 3   gabapentin (NEURONTIN) 300 MG capsule TAKE ONE CAPSULE BY MOUTH EVERY EVENING, MAY TAKE A SECOND CAPSULE DURING THE DAY AS NEEDED FOR PAIN 180 capsule 1   glucose blood (ACCU-CHEK GUIDE) test strip Use to check blood sugar once a day. 100 each 12   HUMIRA PEN 40 MG/0.4ML PNKT Inject 40 mg as directed every Wednesday.      hydrOXYzine (ATARAX/VISTARIL) 25 MG tablet Take 1 tablet (25 mg total) by mouth every 8 (eight) hours as needed. 60 tablet 3   lidocaine (LIDODERM) 5 % Place 1 patch onto the skin daily as needed. Apply patch to area most significant pain once per day.  Remove and discard patch within 12 hours of application. 30 patch 0   metFORMIN (GLUCOPHAGE-XR) 500 MG 24 hr tablet Take 2 tablets (1,000 mg total) by mouth at bedtime. 180 tablet 2   Multiple Vitamins-Minerals (MULTIVITAMIN WOMEN 50+) TABS Take 1 tablet  by mouth daily.     naltrexone (DEPADE) 50 MG tablet Take 12.5 mg by mouth at bedtime.      neomycin-polymyxin b-dexamethasone (MAXITROL) 3.5-10000-0.1 SUSP Place 1 drop into the left eye 3 (three) times daily.     OVER THE COUNTER MEDICATION Take by mouth in the morning and at bedtime. Makes her own sea moss gel 2 table spoons twice daily     oxyCODONE-acetaminophen (PERCOCET) 10-325 MG tablet Take 1 tablet by mouth 4 (four) times daily as needed.     OXYGEN Inhale into the lungs. Takes 2 liters every day (24/7)     potassium chloride (KLOR-CON) 10 MEQ tablet Take 1 tablet (10 mEq total) by mouth daily. 90 tablet 3   topiramate (TOPAMAX) 50 MG tablet Take 1 tablet (50 mg total) by mouth 2 (two) times daily. 180 tablet 1   UNABLE TO FIND Use as instructed to check blood sugar once a day.     venlafaxine XR (EFFEXOR-XR) 75 MG 24 hr capsule Take 1 capsule (75 mg total) by mouth daily with breakfast. 30 capsule 11   XARELTO 20 MG TABS tablet Take 1 tablet (20 mg total) by mouth at bedtime. 90 tablet 2   augmented betamethasone dipropionate (DIPROLENE-AF) 0.05 % ointment Apply topically 2 (two) times daily. (Patient not taking: Reported on 02/01/2021)     diphenoxylate-atropine (LOMOTIL) 2.5-0.025 MG tablet Take 1-2 tablets by mouth 4 (four) times daily as needed for diarrhea or loose stools. (Patient not taking: Reported on 02/01/2021) 60 tablet 0   erythromycin ophthalmic ointment Place 1 application into the left eye 4 (four) times daily. (Patient not taking: Reported on 02/01/2021) 3.5 g 1   furosemide (LASIX) 40 MG tablet TAKE 1 TABLET BY MOUTH EVERY MORNING FOR EDEMA (Patient not taking: Reported on 02/01/2021) 90 tablet 1   methocarbamol (ROBAXIN) 500 MG tablet Take 1 tablet (500 mg total) by mouth every 8 (eight) hours as needed for muscle spasms. (Patient not taking: Reported on 02/01/2021) 90 tablet  0   Omadacycline Tosylate (NUZYRA) 150 MG TABS Take 450 mg qd on Day #1-2, take 300 mg on Day 3-10  (Patient not taking: Reported on 02/01/2021) 26 tablet 0   Terbinafine HCl (LAMISIL AT SPRAY) 1 % SOLN Spray to both feet once daily at bedtime (Patient not taking: Reported on 02/01/2021) 125 mL 1   No facility-administered medications prior to visit.     Review of Systems:   Constitutional:   No  weight loss, night sweats,  Fevers, chills, + fatigue, or  lassitude.  HEENT:   No headaches,  Difficulty swallowing,  Tooth/dental problems, or  Sore throat,                No sneezing, itching, ear ache, nasal congestion, post nasal drip,   CV:  No chest pain,  Orthopnea, PND, swelling in lower extremities, anasarca, dizziness, palpitations, syncope.   GI  No heartburn, indigestion, abdominal pain, nausea, vomiting, diarrhea, change in bowel habits, loss of appetite, bloody stools.   Resp: .  No chest wall deformity  Skin: no rash or lesions.  GU: no dysuria, change in color of urine, no urgency or frequency.  No flank pain, no hematuria   MS:  + joint pain    Physical Exam  BP 106/90 (BP Location: Left Arm, Patient Position: Sitting, Cuff Size: Large)    Pulse 69    Temp 98.3 F (36.8 C) (Oral)    Ht 5\' 7"  (1.702 m)    Wt 270 lb 6.4 oz (122.7 kg)    LMP 05/19/2003    SpO2 99%    BMI 42.35 kg/m   GEN: A/Ox3; pleasant , NAD, chronically ill appearing on O2, walker   HEENT:  [place]/AT, NOSE-clear, THROAT-clear, no lesions, no postnasal drip or exudate noted.   NECK:  Supple w/ fair ROM; no JVD; normal carotid impulses w/o bruits; no thyromegaly or nodules palpated; no lymphadenopathy.    RESP  Clear  P & A; w/o, wheezes/ rales/ or rhonchi. no accessory muscle use, no dullness to percussion  CARD:  RRR, no m/r/g, 1+  peripheral edema, pulses intact, no cyanosis or clubbing.  GI:   Soft & nt; nml bowel sounds; no organomegaly or masses detected.   Musco: Warm bil, no deformities or joint swelling noted.   Neuro: alert, no focal deficits noted.    Skin: Warm, no lesions or  rashes    Lab Results:  CBC   BMET   BNP   Imaging: No results found.    PFT Results Latest Ref Rng & Units 07/27/2018 12/30/2016 05/18/2014  FVC-Pre L 2.51 2.07 2.29  FVC-Predicted Pre % 67 54 61  FVC-Post L 2.51 2.18 2.43  FVC-Predicted Post % 67 57 65  Pre FEV1/FVC % % 79 70 69  Post FEV1/FCV % % 81 68 70  FEV1-Pre L 1.99 1.46 1.58  FEV1-Predicted Pre % 68 49 54  FEV1-Post L 2.03 1.49 1.70  DLCO uncorrected ml/min/mmHg 13.89 11.74 16.78  DLCO UNC% % 61 41 62  DLCO corrected ml/min/mmHg - 11.34 -  DLCO COR %Predicted % - 40 -  DLVA Predicted % 84 66 91  TLC L 4.27 4.07 -  TLC % Predicted % 77 74 -  RV % Predicted % 84 92 -    No results found for: NITRICOXIDE      Assessment & Plan:   COPD (chronic obstructive pulmonary disease) (HCC) Moderate COPD that is oxygen dependent.-Patient is continue on her current regimen.  Continues to have progressive/chronic shortness of breath with decreased activity tolerance. Check chest x-ray today.  Plan  Patient Instructions  Chest xray.  Continue on Symbicort Twice daily  . Rinse after use.  Albuterol inhaler as needed Mucinex DM Twice daily  As needed  Cough/congestion  Continue on Oxygen 2l/m .  Activity as tolerated.  Continue on Xarelto .  Work on healthy weight loss.  Follow up with Dr. Marlou Sa regarding right knee issues.  Covid booster as discussed.  Follow up Dr. Elsworth Soho in 6  months and As needed   Please contact office for sooner follow up if symptoms do not improve or worsen or seek emergency care       Pulmonary embolism [facility]) History of PE and DVT on lifelong anticoagulation.  Continue on Xarelto.  Patient education given  Sarcoidosis Chest x-ray 2022 showed no parenchymal disease.  Calcified lymph nodes. 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  Continue to follow.  Chest x-ray today.  Physical deconditioning Patient appears to have significant physical deconditioning.  She has diffuse  chronic joint pain, back pain.  And now with new right knee pain and subsequent falls.  Have encouraged her to follow-up with her orthopedist to evaluate ongoing knee issues  Plan  Patient Instructions  Chest xray.  Continue on Symbicort Twice daily  . Rinse after use.  Albuterol inhaler as needed Mucinex DM Twice daily  As needed  Cough/congestion  Continue on Oxygen 2l/m .  Activity as tolerated.  Continue on Xarelto .  Work on healthy weight loss.  Follow up with Dr. Marlou Sa regarding right knee issues.  Covid booster as discussed.  Follow up Dr. Elsworth Soho in 6  months and As needed   Please contact office for sooner follow up if symptoms do not improve or worsen or seek emergency care         Rexene Edison, NP 02/01/2021

## 2021-02-01 NOTE — Assessment & Plan Note (Signed)
Patient appears to have significant physical deconditioning.  She has diffuse chronic joint pain, back pain.  And now with new right knee pain and subsequent falls.  Have encouraged her to follow-up with her orthopedist to evaluate ongoing knee issues  Plan  Patient Instructions  Chest xray.  Continue on Symbicort Twice daily  . Rinse after use.  Albuterol inhaler as needed Mucinex DM Twice daily  As needed  Cough/congestion  Continue on Oxygen 2l/m .  Activity as tolerated.  Continue on Xarelto .  Work on healthy weight loss.  Follow up with Dr. Marlou Sa regarding right knee issues.  Covid booster as discussed.  Follow up Dr. Elsworth Soho in 6  months and As needed   Please contact office for sooner follow up if symptoms do not improve or worsen or seek emergency care

## 2021-02-06 DIAGNOSIS — J449 Chronic obstructive pulmonary disease, unspecified: Secondary | ICD-10-CM | POA: Diagnosis not present

## 2021-02-06 NOTE — Unmapped (Signed)
[facility] Refill Coordination Note    Specialty Medication(s) to be Shipped:   Inflammatory Disorders: Humira    Other medication(s) to be shipped: No additional medications requested for fill at this time     Katiejo Gilroy, DOB: May 20, 1960  Phone: [phone] (home)       All above HIPAA information was verified with patient.     Was a Nurse, learning disability used for this call? No    Completed refill call assessment today to schedule patient's medication shipment from the [facility] [phone]).  All relevant notes have been reviewed.     Specialty medication(s) and dose(s) confirmed: Regimen is correct and unchanged.   Changes to medications: Ryla reports no changes at this time.  Changes to insurance: No  New side effects reported not previously addressed with a pharmacist or physician: None reported  Questions for the pharmacist: No    Confirmed patient received a Conservation officer, historic buildings and a Surveyor, mining with first shipment. The patient will receive a drug information handout for each medication shipped and additional FDA Medication Guides as required.       DISEASE/MEDICATION-SPECIFIC INFORMATION        For patients on injectable medications: Patient currently has 2 doses left.  Next injection is scheduled for 02/07/2021.     SPECIALTY MEDICATION ADHERENCE     Medication Adherence    Patient reported X missed doses in the last month: 0  Specialty Medication: Humira  Patient is on additional specialty medications: No  Any gaps in refill history greater than 2 weeks in the last 3 months: no  Demonstrates understanding of importance of adherence: yes  Informant: patient  Reliability of informant: reliable  Confirmed plan for next specialty medication refill: delivery by pharmacy  Refills needed for supportive medications: not needed        Were doses missed due to medication being on hold? No    REFERRAL TO PHARMACIST     Referral to the pharmacist: Not needed      [facility]     Shipping address confirmed in Epic.     Delivery Scheduled: Yes, Expected medication delivery date: 02/08/2021.     Medication will be delivered via UPS to the prescription address in Epic WAM.    Koya Hunger D Alvie Fowles   [facility] Specialty Technician

## 2021-02-06 NOTE — Progress Notes (Signed)
Called and spoke with patient, advised of results/recommendations per Tammy Parrett NP.  She verbalized understanding.  Nothing further needed.

## 2021-02-07 MED FILL — HUMIRA PEN CITRATE FREE 40 MG/0.4 ML: SUBCUTANEOUS | 28 days supply | Qty: 4 | Fill #9

## 2021-02-08 ENCOUNTER — Other Ambulatory Visit: Payer: Self-pay | Admitting: Internal Medicine

## 2021-02-08 MED ORDER — TRIAMCINOLONE ACETONIDE 0.1 % TOPICAL CREAM
Freq: Two times a day (BID) | TOPICAL | 5 refills | 0.00000 days
Start: 2021-02-08 — End: ?

## 2021-02-09 MED ORDER — TRIAMCINOLONE ACETONIDE 0.1 % TOPICAL CREAM
Freq: Two times a day (BID) | TOPICAL | 5 refills | 0.00000 days | Status: CP
Start: 2021-02-09 — End: 2022-02-09

## 2021-02-15 ENCOUNTER — Ambulatory Visit: Payer: Self-pay

## 2021-02-15 ENCOUNTER — Ambulatory Visit (INDEPENDENT_AMBULATORY_CARE_PROVIDER_SITE_OTHER): Payer: Medicare Other | Admitting: Orthopedic Surgery

## 2021-02-15 ENCOUNTER — Other Ambulatory Visit: Payer: Self-pay

## 2021-02-15 DIAGNOSIS — M25561 Pain in right knee: Secondary | ICD-10-CM

## 2021-02-15 DIAGNOSIS — R29898 Other symptoms and signs involving the musculoskeletal system: Secondary | ICD-10-CM | POA: Diagnosis not present

## 2021-02-16 ENCOUNTER — Encounter: Payer: Self-pay | Admitting: Orthopedic Surgery

## 2021-02-16 ENCOUNTER — Other Ambulatory Visit: Payer: Self-pay | Admitting: *Deleted

## 2021-02-16 NOTE — Patient Instructions (Signed)
Visit Information  Thank you for taking time to visit with me today. Please don't hesitate to contact me if I can be of assistance to you before our next scheduled telephone appointment.  Following are the goals we discussed today:  Current Barriers:  Knowledge Deficits related to plan of care for management of COPD   RNCM Clinical Goal(s):  Patient will verbalize understanding of plan for management of COPD as evidenced by no COPD exacerbation and adhering to low sodium diet through collaboration with RN Care manager, provider, and care team.   Interventions: Inter-disciplinary care team collaboration (see longitudinal plan of care) Evaluation of current treatment plan related to  self management and patient's adherence to plan as established by provider   COPD Interventions:  (Status:  Goal on track:  Yes.) Long Term Goal Provided patient with basic written and verbal COPD education on self care/management/and exacerbation prevention Advised patient to track and manage COPD triggers Advised patient to self assesses COPD action plan zone and make appointment with provider if in the yellow zone for 48 hours without improvement Discussed the importance of adequate rest and management of fatigue with COPD  Patient Goals/Self-Care Activities: Take all medications as prescribed Attend all scheduled provider appointments Call pharmacy for medication refills 3-7 days in advance of running out of medications Perform all self care activities independently  Perform IADL's (shopping, preparing meals, housekeeping, managing finances) independently Call provider office for new concerns or questions  call the Suicide and Crisis Lifeline: 988 if experiencing a Mental Health or [place]  limit outdoor activity during cold weather listen for public air quality announcements every day develop a rescue plan eliminate symptom triggers at home follow rescue plan if symptoms  flare-up keep follow-up appointments: with PCP, Pulmonologist and eye Dr eat healthy/prescribed diet: low sodium diet use devices that will help like a cane, sock-puller or reacher do breathing exercises every day  Follow Up Plan:  Telephone follow up appointment with care management team member scheduled for:  May 2023. The patient has been provided with contact information for the care management team and has been advised to call with any health related questions or concerns.    Our next appointment is by telephone on February 2023  Please call Johny Shock RN at [phone]  if you need to cancel or reschedule your appointment.   Please call the Suicide and Crisis Lifeline: 988 if you are experiencing a Mental Health or [place] or need someone to talk to.  The patient verbalized understanding of instructions, educational materials, and care plan provided today and agreed to receive a mailed copy of patient instructions, educational materials, and care plan.   Telephone follow up appointment with care management team member scheduled for: The patient has been provided with contact information for the care management team and has been advised to call with any health related questions or concerns.   [facility] [phone]

## 2021-02-16 NOTE — Progress Notes (Signed)
Office Visit Note   Patient: [name]           Date of Birth: [birth year]           MRN: [id number] Visit Date: 02/15/2021 Requested by: Cassandria Anger, MD [place],  [place] [id number] PCP: Cassandria Anger, MD  Subjective: Chief Complaint  Patient presents with   Right Knee - Pain    HPI: Edlyn Rosenburg is a 61 y.o. female who presents to the office complaining of multiple falls.  Patient states that since October 2022 she has had about 6 falls due to her right leg feeling like it wants to give out on her.  She has history of prior history of prior right total knee arthroplasty around 2016 by [place]..  She denies any pain in the right knee though she states that it feels weak at times and wants to give way on her.  Denies any fevers or chills or change in the appearance of the incision.  No drainage from the incision.  She denies any groin pain or radicular pain but does note low back pain for which she has received injections previously.  No numbness or tingling in the right or left leg.  Cannot really do stairs due to the leg feeling like he wants to give out on her.  Last MRI of the lumbar spine was in August 2022.  She takes oxycodone for her chronic back pain..                ROS: All systems reviewed are negative as they relate to the chief complaint within the history of present illness.  Patient denies fevers or chills.  Assessment & Plan: Visit Diagnoses:  1. Right knee pain, unspecified chronicity   2. Right leg weakness     Plan: Patient is a 61 year old female who presents for evaluation of right leg instability.  She has history of right total knee arthroplasty but exam today does not show any sign of periprosthetic joint infection or knee instability.  Radiographs are negative for any evidence of acute changes in the right knees.  She does have some hip flexion weakness relative to the left on exam today.  She has history of  chronic back pain with last MRI scan in August 2022 that demonstrated significant facet arthritis at multiple levels but no sign of stenosis or disc protrusion that would contribute to right leg weakness.  However all of her symptoms have started about 2 months after that MRI scan.  With right leg weakness that does not seem to originate from her right knee prosthesis, plan to evaluate further with repeat MRI of the lumbar spine to evaluate for source of right leg weakness.  Follow-up after MRI to review results.  Follow-Up Instructions: No follow-ups on file.   Orders:  Orders Placed This Encounter  Procedures   XR Knee 1-2 Views Right   MR Lumbar Spine w/o contrast   No orders of the defined types were placed in this encounter.     Procedures: No procedures performed   Clinical Data: No additional findings.  Objective: Vital Signs: LMP 05/19/2003   Physical Exam:  Constitutional: Patient appears well-developed HEENT:  Head: Normocephalic Eyes:EOM are normal Neck: Normal range of motion Cardiovascular: Normal rate Pulmonary/chest: Effort normal Neurologic: Patient is alert Skin: Skin is warm Psychiatric: Patient has normal mood and affect  Ortho Exam: Ortho exam demonstrates right knee with 0 degrees extension 115 degrees  of knee flexion.  Incision is well-healed without any evidence of infection.  No sinus tract noted.  Global tenderness mildly over the right knee.  No significant increase in warmth compared with the contralateral knee.  She has excellent quadricep, hamstring, dorsiflexion, plantarflexion strength of both legs.  Right leg has 5 -/5 hip flexion strength compared with 5/5 hip flexion strength on the left leg.  Negative straight leg raise of both legs.  Tenderness throughout the axial lumbar spine.  Increased pain with lumbar spine flexion and extension.  No pain with hip range of motion.  No limitation of internal rotation of the right hip.  No significant mid  flexion instability of the right knee.  Negative anterior/posterior drawer of the right knee.  Specialty Comments:  No specialty comments available.  Imaging: No results found.   PMFS History: Patient Active Problem List   Diagnosis Date Noted   Dacrocystitis, left 11/20/2020   Cellulitis, face 11/20/2020   Well adult exam 07/19/2020   Benign neoplasm of ascending colon    Benign neoplasm of descending colon    Benign neoplasm of sigmoid colon    Chronic diarrhea    Diarrhea 07/06/2019   Shoulder pain, bilateral 02/01/2019   Physical deconditioning 01/28/2019   Chronic anticoagulation 11/19/2018   Insulin dependent diabetes mellitus type IA ([place]) 11/19/2018   Oxygen dependent 11/19/2018   Family history of colon cancer 10/26/2018   Myringotomy tube status 08/21/2018   Vaginal candidiasis 07/15/2018   Nasal crusting 06/12/2018   Chronic serous otitis media of left ear 06/04/2018   Mixed conductive and sensorineural hearing loss of left ear with restricted hearing of right ear 06/04/2018   Chronic cholecystitis with calculus 02/11/2018   Thrush, oral 01/15/2018   Nasal sinus congestion 01/15/2018   Dyslipidemia 10/28/2017   Pruritus 10/23/2017   Abnormal liver function test    Anemia 09/24/2017   Migraines 09/17/2017   Cholecystitis, acute 09/12/2017   COPD (chronic obstructive pulmonary disease) ([place]) 88/28/0034   Diastolic dysfunction 91/79/1505   OSA (obstructive sleep apnea) 09/30/2016   Chronic respiratory failure with hypoxia ([place]) 08/23/2016   Pulmonary embolism ([place]) 07/21/2016   DVT (deep venous thrombosis) ([place]) 07/21/2016   Acute bronchitis 01/30/2016   Weight gain 12/20/2015   Controlled type 2 diabetes mellitus with chronic kidney disease, without long-term current use of insulin ([place]) 02/01/2015   Abnormal SPEP 06/04/2013   Memory loss 04/27/2013   Panic attacks 03/17/2013   Rash 10/27/2012   Pulmonary nodules 07/20/2012   CAD in native artery  05/28/2012   Seborrheic dermatitis 01/20/2012   Obesity, Class III, BMI 40-49.9 (morbid obesity) ([place]) 03/07/2010   INSOMNIA, CHRONIC 03/07/2010   Allergic rhinitis 10/26/2009   Grief reaction 08/01/2009   Essential hypertension 08/27/2007   B12 deficiency 08/25/2007   Vitamin D deficiency 08/25/2007   Urinary tract infection 02/07/2007   SINUSITIS, ACUTE 02/02/2007   Osteoarthritis 02/02/2007   Sarcoidosis 08/14/2006   Anxiety disorder 08/14/2006   Major depressive disorder, recurrent episode ([place]) 08/14/2006   GERD 08/14/2006   Past Medical History:  Diagnosis Date   Allergic rhinitis    ALLERGIC RHINITIS 10/26/2009   Anxiety    ANXIETY 08/14/2006   B12 DEFICIENCY 6/97/9480   Complication of anesthesia    pt has had difficulty following anesthesia with her knee in 2016-unable to care for herself afterward   Confusion    Depression    takes Lexapro daily   Diabetes mellitus without complication ([place])    was  on insulin but has been off since Nov 2015 and now only takes Metformin daily   DYSPNEA 04/28/2009   with exertion   Esophageal reflux    takes Nexium daily   Fibromyalgia    Headache    last migraine 2-6yrs ago;takes Topamax daily   History of shingles    Hypertension    takes Coreg daily   Insomnia    takes Nortriptyline nightly    Joint pain    Joint swelling    Left knee pain    Lichen planus    Long-term memory loss    Nocturia    OSA (obstructive sleep apnea)    doesn't use CPAP;sleep study in epic from 2006   Osteoarthritis    Osteoarthritis    Pneumonia    over 30 yrs ago   PONV (postoperative nausea and vomiting)    Protein calorie malnutrition ([facility])    Rheumatoid arthritis ([place])    Sarcoidosis    Dr. Lolita Patella   Short-term memory loss    Shortness of breath    Sleep apnea     wears oxygen   TIA (transient ischemic attack)    Unsteady gait    Urinary urgency    Vitamin D deficiency    is supposed to take Vit D but can't afford it    VITAMIN D DEFICIENCY 08/25/2007    Family History  Problem Relation Age of Onset   Heart disease Mother    Kidney disease Mother        renal failure   Cancer Father        leukemia   Hypertension Other    Coronary artery disease Other        female 1st degree relative <60   Heart failure Other        congestive   Coronary artery disease Other        Female 1st degree relative <50   Breast cancer Other        1st degree relative <50 S   Breast cancer Sister    Colon cancer Brother 70   Stroke Brother    Heart attack Brother    Cancer Brother 74       colon ca   Esophageal cancer Neg Hx    Stomach cancer Neg Hx    Rectal cancer Neg Hx     Past Surgical History:  Procedure Laterality Date   APPENDECTOMY     arthroscopic knee surgery Right 11-12-04   AXILLARY ABCESS IRRIGATION AND DEBRIDEMENT  Jul & Aug2012   BIOPSY  11/02/2019   Procedure: BIOPSY;  Surgeon: Jerene Bears, MD;  Location: [facility];  Service: Gastroenterology;;   CARPAL TUNNEL RELEASE Left 05/23/2014   Procedure: CARPAL TUNNEL RELEASE;  Surgeon: Meredith Pel, MD;  Location: [facility];  Service: Orthopedics;  Laterality: Left;   CHOLECYSTECTOMY N/A 02/11/2018   Procedure: LAPAROSCOPIC CHOLECYSTECTOMY WITH INTRAOPERATIVE CHOLANGIOGRAM ERAS PATHWAY;  Surgeon: Donnie Mesa, MD;  Location: [facility];  Service: General;  Laterality: N/A;   COLONOSCOPY WITH PROPOFOL N/A 11/02/2019   Procedure: COLONOSCOPY WITH PROPOFOL;  Surgeon: Jerene Bears, MD;  Location: [facility];  Service: Gastroenterology;  Laterality: N/A;   cyst removed from top of buttocks  at age 8   ENDOMETRIAL ABLATION     IR CHOLANGIOGRAM EXISTING TUBE  11/21/2017   IR CHOLANGIOGRAM EXISTING TUBE  01/16/2018   IR EXCHANGE BILIARY DRAIN  11/10/2017   IR PATIENT EVAL TECH 0-60 MINS  02/03/2018  IR PERC CHOLECYSTOSTOMY  09/13/2017   IR RADIOLOGIST EVAL & MGMT  10/08/2017   LACRIMAL DUCT EXPLORATION Right 06/26/2017   Procedure: LACRIMAL DUCT EXPLORATION  AND ETHMOIDECTOMY;  Surgeon: Clista Bernhardt, MD;  Location: [facility];  Service: Ophthalmology;  Laterality: Right;   POLYPECTOMY  11/02/2019   Procedure: POLYPECTOMY;  Surgeon: Jerene Bears, MD;  Location: [facility];  Service: Gastroenterology;;   TEAR DUCT PROBING Right 06/26/2017   Procedure: TEAR DUCT PROBING WITH STENT;  Surgeon: Clista Bernhardt, MD;  Location: [facility];  Service: Ophthalmology;  Laterality: Right;   TOTAL KNEE ARTHROPLASTY Right 11/15/2014   Procedure: TOTAL RIGHT KNEE ARTHROPLASTY;  Surgeon: Meredith Pel, MD;  Location: [facility];  Service: Orthopedics;  Laterality: Right;   TOTAL KNEE ARTHROPLASTY Left 07/13/2015   Procedure: LEFT TOTAL KNEE ARTHROPLASTY;  Surgeon: Meredith Pel, MD;  Location: [facility];  Service: Orthopedics;  Laterality: Left;   Social History   Occupational History   Occupation: disabled  Tobacco Use   Smoking status: Former    Packs/day: 0.50    Years: 10.00    Pack years: 5.00    Types: Cigarettes    Start date: 1988    Quit date: 2004    Years since quitting: 19.1   Smokeless tobacco: Never   Tobacco comments:    quit smoking in 2004  Vaping Use   Vaping Use: Never used  Substance and Sexual Activity   Alcohol use: Not Currently    Alcohol/week: 1.0 standard drink    Types: 1 Glasses of wine per week   Drug use: No   Sexual activity: Not Currently

## 2021-02-16 NOTE — Patient Outreach (Signed)
[facility]) Care Management [place] Note   02/16/2021 Name:  [name] MRN:  [id number] DOB:  18-Oct-1960  Summary: Per patient she has increased her oxygen to 3 liters per nasal cannula. She is not using her CPAP. Patient stated she had 2 recent falls. Per patient she had a lot of bruising. She is taking medications as per ordered. She has not taken her COVID booster. Per patient she missed 2 appointments. She needs to call 3 days in advance for transportation.   Recommendations/Changes made from today's visit: RN contacted homebound pharmacy to administer COVID booster Continue to take medications as per ordered Contact transportation in advance to keep appointments    Subjective: [name] is an 61 y.o. year old female who is a primary patient of Plotnikov, Evie Lacks, MD. The care management team was consulted for assistance with care management and/or care coordination needs.    RN Health Coach completed Telephone Visit today.   Objective:  Medications Reviewed Today     Reviewed by Rosalin Hawking (Physician Assistant Certified) on 95/62/13 at 1241  Med List Status: <None>   Medication Order Taking? Sig Documenting Provider Last Dose Status Informant  albuterol (VENTOLIN HFA) 108 (90 Base) MCG/ACT inhaler [id number] No Inhale 2 puffs into the lungs every 6 (six) hours as needed for wheezing or shortness of breath. Rigoberto Noel, MD Taking Active   B-D ULTRA-FINE 33 LANCETS MISC [id number] No Use as instructed to test sugars once  daily Philemon Kingdom, MD Taking Active Self  budesonide-formoterol [facility]) 160-4.5 MCG/ACT inhaler [id number] No Inhale 2 puffs into the lungs TWICE DAILY Parrett, Tammy S, NP Taking Active   carvedilol (COREG) 12.5 MG tablet [id number] No Take 1 tablet (12.5 mg total) by mouth 2 (two) times daily with a meal. Plotnikov, Evie Lacks, MD Taking Active   Cholecalciferol (VITAMIN D3) 1.25 MG  (50000 UT) CAPS [id number] No Take 1 capsule by mouth every 30 (thirty) days. Plotnikov, Evie Lacks, MD Taking Active   clonazePAM (KLONOPIN) 0.5 MG tablet [id number] No Take 1 TO 2 tablets (0.5-1 mg total) by mouth at bedtime as needed for anxiety. Plotnikov, Evie Lacks, MD Taking Active   Cyanocobalamin (VITAMIN B-12) 1000 MCG SUBL [id number] No Place 1 tablet (1,000 mcg total) under the tongue daily. Plotnikov, Evie Lacks, MD Taking Active   dexlansoprazole (DEXILANT) 60 MG capsule [id number]  Take 1 capsule (60 mg total) by mouth daily. Plotnikov, Evie Lacks, MD  Active   gabapentin (NEURONTIN) 300 MG capsule [id number] No TAKE ONE CAPSULE BY MOUTH EVERY EVENING, MAY TAKE A SECOND CAPSULE DURING THE DAY AS NEEDED FOR PAIN Plotnikov, Evie Lacks, MD Taking Active   glucose blood (ACCU-CHEK GUIDE) test strip [id number] No Use to check blood sugar once a day. Philemon Kingdom, MD Taking Active Self  HUMIRA PEN 40 MG/0.4ML PNKT [id number] No Inject 40 mg as directed every Wednesday.  [provider] Taking Active Self  hydrOXYzine (ATARAX/VISTARIL) 25 MG tablet [id number] No Take 1 tablet (25 mg total) by mouth every 8 (eight) hours as needed. Plotnikov, Evie Lacks, MD Taking Active   lidocaine (LIDODERM) 5 % [id number] No Place 1 patch onto the skin daily as needed. Apply patch to area most significant pain once per day.  Remove and discard patch within 12 hours of application. Petrucelli, Samantha R, PA-C Taking Active   metFORMIN (GLUCOPHAGE-XR) 500 MG 24 hr tablet [id number] No Take 2 tablets (1,000 mg total) by mouth at  bedtime. Plotnikov, Evie Lacks, MD Taking Active   Multiple Vitamins-Minerals (MULTIVITAMIN WOMEN 50+) TABS [id number] No Take 1 tablet by mouth daily. [provider] Taking Active   naltrexone (DEPADE) 50 MG tablet [id number] No Take 12.5 mg by mouth at bedtime.  [provider] Taking Active Self  neomycin-polymyxin b-dexamethasone (MAXITROL) 3.5-10000-0.1 SUSP [id number]  No Place 1 drop into the left eye 3 (three) times daily. [provider] Taking Active   OVER THE COUNTER MEDICATION [id number]  Take by mouth in the morning and at bedtime. Makes her own sea moss gel 2 table spoons twice daily [provider]  Active   oxyCODONE-acetaminophen (PERCOCET) 10-325 MG tablet [id number] No Take 1 tablet by mouth 4 (four) times daily as needed. [provider] Taking Active   OXYGEN [id number] No Inhale into the lungs. Takes 2 liters every day (24/7) [provider] Taking Active Self  potassium chloride (KLOR-CON) 10 MEQ tablet [id number] No Take 1 tablet (10 mEq total) by mouth daily. Plotnikov, Evie Lacks, MD Taking Active   topiramate (TOPAMAX) 50 MG tablet [id number] No Take 1 tablet (50 mg total) by mouth 2 (two) times daily. Plotnikov, Evie Lacks, MD Taking Active   UNABLE TO FIND [id number] No Use as instructed to check blood sugar once a day. [provider] Taking Active Self  venlafaxine XR (EFFEXOR-XR) 75 MG 24 hr capsule [id number] No Take 1 capsule (75 mg total) by mouth daily with breakfast. Plotnikov, Evie Lacks, MD Taking Active   XARELTO 20 MG TABS tablet [id number] No Take 1 tablet (20 mg total) by mouth at bedtime. Plotnikov, Evie Lacks, MD Taking Active              SDOH:  (Social Determinants of Health) assessments and interventions performed:  SDOH Interventions    Flowsheet Row Most Recent Value  SDOH Interventions   Food Insecurity Interventions Intervention Not Indicated  Transportation Interventions Other (Comment)  [Patient needs to call 3 days in advance from [facility] for scheduled appt. She has missed appt due to not calling in time.]       Care Plan  Review of patient past medical history, allergies, medications, health status, including review of consultants reports, laboratory and other test data, was performed as part of comprehensive evaluation for care management services.   Care Plan : COPD  (Adult)  Updates made by Verlin Grills, RN since 02/16/2021 12:00 AM     Problem: Psychological Adjustment to Diagnosis (COPD) Resolved 02/16/2021  Priority: Medium  Onset Date: 11/26/2019  Note:   [id number] Resolving due to duplicate goal     Long-Range Goal: Adjustment to Disease Achieved Completed 02/16/2021  Start Date: 11/26/2019  Expected End Date: 03/16/2021  Recent Progress: On track  Priority: Medium  Note:   Evidence-based guidance:  Explore the patient and family's understanding of the disease; use open-ended questions to encourage patient and family to share what is important to them.  Assess and provide support around experience of loss and lifestyle adjustments, such as loss of function, roles, social ties, independence and hobbies.  Support adjustment to new normal with focus on maintaining daily life as closely as possible to life before chronic obstructive pulmonary disease (COPD) diagnosis.  Express empathy; listen actively by encouraging the patient and family to express feelings, concerns and fears; ask questions and encourage open communication regarding embarrassing or disturbing topics.  Assess for factors that may impact coping or adjustment, such as a preexisting mental health condition, prognosis, lack  of social support, debilitating disease or financial difficulty that include no or insufficient insurance coverage.  Assess and address fear or anxiety related to dyspnea or perceived stigma of a noninfectious cough.  Assess anxiety, feelings of panic when breathing becomes labored, as well as impact on family/caregiver; consider cognitive behavioral therapy, deep breathing, meditation, visualization, photo or light therapy.  Encourage advanced care planning discussion to clarify goals of care; palliative care or hospice may be considered for advanced COPD patients if it aligns with patient's goals of care.  Explore common risk factors for depression, such as  personal or family history of depression, substance use and stressors that include missed work, losing ability to do what patient was used to doing, changes in appearance, physical   or sexual abuse.  Use a validated screening tool to identify level of suicide risk. If at risk, develop safety plan and implement additional safety measures based on level of risk, such as behavioral health referral or immediate assessment.   Notes:     Task: Support Psychosocial Response to Chronic Obstructive Pulmonary Disease Completed 02/16/2021  Due Date: 01/12/2021  Note:   Care Management Activities:    - decision-making supported - depression screen reviewed - emotional support provided - verbalization of feelings encouraged    Notes:     Problem: Disease Progression (COPD) Resolved 02/16/2021  Priority: Medium  Onset Date: 11/26/2019  Note:   [id number] Resolving due to duplicate goal     Long-Range Goal: Disease Progression Minimized or Managed Completed 02/16/2021  Start Date: 11/26/2019  Expected End Date: 04/12/2021  Recent Progress: On track  Priority: Medium  Note:   Evidence-based guidance:  Identify current smoking/tobacco use; provide smoking cessation intervention.  Assess symptom control by the frequency and type of symptoms, reliever use and activity limitation at every encounter.  Assess risk for exacerbation (flare up) by evaluating spirometry, pulse oximetry, reliever use, presentation of symptoms and activity limitation; anticipate treatment adjustment based on risks and resources.  Develop and/or review and reinforce use of COPD rescue (action) plan even when symptoms are controlled or infrequent.  Ask patient to bring inhaler to all visits; assess and reinforce correct technique; address barriers to proper inhaler use, such as older age, use of multiple devices and lack of understanding.   Identify symptom triggers, such as smoking, virus, weather change, emotional upset,  exercise, obesity and environmental allergen; consider reduction of work-exposure versus elimination to avoid compromising employment.  Correlate presentation to comorbidity, such as diabetes, heart failure, obstructive sleep apnea, depression and anxiety, which may worsen symptoms.  Promote participation in pulmonary rehabilitation for breathing exercises, skills training, improved exercise capacity, mood and quality of life; address barriers to participation.  Promote physical activity or exercise to improve or maintain exercise capacity, based on tolerance that may include walking, water exercise, cycling or limb muscle strength training.  Promote use of energy conservation and activity pacing techniques.  Promote use of breathing and coughing techniques, such as inspiratory muscle training, pursed-lip breathing, diaphragmatic breathing, pranayama yoga breathing or huff cough.  Screen for malnutrition risk factors, such as unintentional weight loss and poor oral intake; refer to dietitian if identified.  Screen for obstructive sleep apnea; prepare patient for polysomnography based on risk and presentation.  Prepare patient for use of long-term oxygen and noninvasive ventilation to relieve hypercapnia, hypoxemia, obstructive sleep apnea and reduce work of breathing.   Notes:     Task: Alleviate Barriers to COPD Management Completed 02/16/2021  Due Date: 01/12/2021  Note:   Care Management Activities:    - activity or exercise based on tolerance encouraged - communicable disease prevention promoted - medication-adherence assessment completed - rescue (action) plan reviewed - self-awareness of symptom triggers encouraged    Notes:     Problem: Symptom Exacerbation (COPD) Resolved 02/16/2021  Priority: Medium  Onset Date: 11/26/2019  Note:   [id number] Resolving due to duplicate goal     Long-Range Goal: Symptom Exacerbation Prevented or Minimized Completed 02/16/2021  Start Date:  11/26/2019  Expected End Date: 04/12/2021  Recent Progress: On track  Priority: High  Note:   Evidence-based guidance:  Monitor for signs of respiratory infection, including changes in sputum color, volume and thickness, as well as fever.  Encourage infection prevention strategies that may include prophylactic antibiotic therapy for patients with history of frequent exacerbations or antibiotic administration during exacerbation based on presentation, risk and benefit.  Encourage receipt of influenza and pneumococcal vaccine.  Prepare patient for use of home long-term oxygen therapy in presence of sever resting hypoxemia.  Prepare patients for laboratory studies or diagnostic exams, such as spirometry, pulse oximetry and arterial blood gas based on current symptoms, risk factors and presentation.  Assess barriers and manage adherence, including inhaler technique and persistent trigger exposure; encourage adherence, even when symptoms are controlled or infrequent.  Assess and monitor for signs/symptoms of psychosocial concerns, such as shortness of breath-anxiety cycle or depression that may impact stability of symptoms.  Identify economic resources, sociocultural beliefs, social factors and health literacy that may interfere with adherence.  Promote lifestyle changes when needed, including regular physical activity based on tolerance, weight loss, healthy eating and stress management.  Consider referral to nurse or community health worker or home-visiting program for intensive support and education (disease-management program).  Increase frequency of follow-up following exacerbation or hospitalization; consider transition of care interventions, such as hospital visit, home visit, telephone follow-up, review of discharge summary and resource referrals.   Notes:  [id number] Patient has received flu vaccine RN will schedule COVID vaccine No admissions for COPD exacerbation [id number] Resolving due to  duplicate goal     Task: Identify and Minimize Risk of COPD Exacerbation Completed 02/16/2021  Due Date: 01/12/2021  Note:   Care Management Activities:    - barriers to lifestyle changes reviewed and addressed - barriers to treatment reviewed and addressed - breathing techniques encouraged - healthy lifestyle promoted - rescue (action) plan reviewed - signs/symptoms of infection reviewed - signs/symptoms of worsening disease assessed - symptom triggers identified    Notes:  Patient received her COVID booster and flu shot    Care Plan : [place] of Care  Updates made by Jeren Dufrane, Eppie Gibson, RN since 02/16/2021 12:00 AM     Problem: Knowledge Deficit Related to COPD and Care Coordination Needs   Priority: High     Long-Range Goal: Development Plan of Care for Management of COPD   Start Date: 02/16/2021  Expected End Date: 02/16/2021  Priority: High  Note:   Current Barriers:  Knowledge Deficits related to plan of care for management of COPD   RNCM Clinical Goal(s):  Patient will verbalize understanding of plan for management of COPD as evidenced by no COPD exacerbation and adhering to low sodium diet  through collaboration with RN Care manager, provider, and care team.   Interventions: Inter-disciplinary care team collaboration (see longitudinal plan of care) Evaluation of current treatment plan related to  self management and patient's adherence to plan as established by provider  COPD Interventions:  (Status:  Goal on track:  Yes.) Long Term Goal Provided patient with basic written and verbal COPD education on self care/management/and exacerbation prevention Advised patient to track and manage COPD triggers Advised patient to self assesses COPD action plan zone and make appointment with provider if in the yellow zone for 48 hours without improvement Discussed the importance of adequate rest and management of fatigue with COPD  Patient Goals/Self-Care  Activities: Take all medications as prescribed Attend all scheduled provider appointments Call pharmacy for medication refills 3-7 days in advance of running out of medications Perform all self care activities independently  Perform IADL's (shopping, preparing meals, housekeeping, managing finances) independently Call provider office for new concerns or questions  call the Suicide and Crisis Lifeline: 988 if experiencing a Mental Health or [place]  limit outdoor activity during cold weather listen for public air quality announcements every day develop a rescue plan eliminate symptom triggers at home follow rescue plan if symptoms flare-up keep follow-up appointments: with PCP, Pulmonologist and eye Dr eat healthy/prescribed diet: low sodium diet use devices that will help like a cane, sock-puller or reacher do breathing exercises every day  Follow Up Plan:  Telephone follow up appointment with care management team member scheduled for:  May 2023. The patient has been provided with contact information for the care management team and has been advised to call with any health related questions or concerns.        Plan: Telephone follow up appointment with care management team member scheduled for:  May 2023 The patient has been provided with contact information for the care management team and has been advised to call with any health related questions or concerns.  RN notified homebound pharmacy for [place] [facility] [phone]

## 2021-02-19 ENCOUNTER — Telehealth: Payer: Self-pay | Admitting: Orthopedic Surgery

## 2021-02-19 NOTE — Telephone Encounter (Signed)
Per Dr Marlou Sa advised to try ice/OTC meds

## 2021-02-19 NOTE — Telephone Encounter (Signed)
Pt states since previous appt where Dr. Marlou Sa moved her leg back and forth, pt states she is now in extreme pain and it is radiating up her thigh and back. Pt wants to know if there is anything to do that can help. The best call back number is [phone].

## 2021-02-23 ENCOUNTER — Other Ambulatory Visit: Payer: Medicare Other

## 2021-03-01 ENCOUNTER — Ambulatory Visit: Payer: Medicare Other | Admitting: Orthopedic Surgery

## 2021-03-06 NOTE — Unmapped (Signed)
[facility] Refill Coordination Note    Specialty Medication(s) to be Shipped:   Inflammatory Disorders: Humira    Other medication(s) to be shipped: No additional medications requested for fill at this time     Carolyn Newton, DOB: 12-Jun-1960  Phone: [phone] (home)       All above HIPAA information was verified with patient.     Was a Nurse, learning disability used for this call? No    Completed refill call assessment today to schedule patient's medication shipment from the [facility] [phone]).  All relevant notes have been reviewed.     Specialty medication(s) and dose(s) confirmed: Regimen is correct and unchanged.   Changes to medications: Carolyn Newton reports no changes at this time.  Changes to insurance: No  New side effects reported not previously addressed with a pharmacist or physician: None reported  Questions for the pharmacist: No    Confirmed patient received a Conservation officer, historic buildings and a Surveyor, mining with first shipment. The patient will receive a drug information handout for each medication shipped and additional FDA Medication Guides as required.       DISEASE/MEDICATION-SPECIFIC INFORMATION        For patients on injectable medications: Patient currently has 2 doses left.  Next injection is scheduled for 03/07/2021.    SPECIALTY MEDICATION ADHERENCE     Medication Adherence    Patient reported X missed doses in the last month: 0  Specialty Medication: Humira  Patient is on additional specialty medications: No        Were doses missed due to medication being on hold? No    REFERRAL TO PHARMACIST     Referral to the pharmacist: Not needed      [facility]     Shipping address confirmed in Epic.     Delivery Scheduled: Yes, Expected medication delivery date: 03/13/2021.     Medication will be delivered via UPS to the prescription address in Epic WAM.    Carolyn Newton [facility] Specialty Technician

## 2021-03-07 ENCOUNTER — Other Ambulatory Visit: Payer: Self-pay | Admitting: Internal Medicine

## 2021-03-07 ENCOUNTER — Ambulatory Visit
Admission: RE | Admit: 2021-03-07 | Discharge: 2021-03-07 | Disposition: A | Discharge status: Home / Self Care | Payer: Medicare Other | Source: Ambulatory Visit | Attending: Orthopedic Surgery | Admitting: Orthopedic Surgery

## 2021-03-07 DIAGNOSIS — M545 Low back pain, unspecified: Secondary | ICD-10-CM | POA: Diagnosis not present

## 2021-03-07 DIAGNOSIS — R29898 Other symptoms and signs involving the musculoskeletal system: Secondary | ICD-10-CM

## 2021-03-07 DIAGNOSIS — M4316 Spondylolisthesis, lumbar region: Secondary | ICD-10-CM | POA: Diagnosis not present

## 2021-03-09 DIAGNOSIS — J449 Chronic obstructive pulmonary disease, unspecified: Secondary | ICD-10-CM | POA: Diagnosis not present

## 2021-03-09 DIAGNOSIS — H04553 Acquired stenosis of bilateral nasolacrimal duct: Secondary | ICD-10-CM | POA: Diagnosis not present

## 2021-03-09 DIAGNOSIS — Z01818 Encounter for other preprocedural examination: Secondary | ICD-10-CM | POA: Diagnosis not present

## 2021-03-09 DIAGNOSIS — H04302 Unspecified dacryocystitis of left lacrimal passage: Secondary | ICD-10-CM | POA: Diagnosis not present

## 2021-03-12 ENCOUNTER — Other Ambulatory Visit: Payer: Self-pay | Admitting: Internal Medicine

## 2021-03-12 ENCOUNTER — Other Ambulatory Visit: Payer: Self-pay | Admitting: Adult Health

## 2021-03-12 MED FILL — HUMIRA PEN CITRATE FREE 40 MG/0.4 ML: SUBCUTANEOUS | 28 days supply | Qty: 4 | Fill #10

## 2021-03-13 ENCOUNTER — Telehealth: Payer: Self-pay | Admitting: Adult Health

## 2021-03-14 ENCOUNTER — Other Ambulatory Visit: Payer: Self-pay

## 2021-03-14 ENCOUNTER — Ambulatory Visit: Payer: Self-pay

## 2021-03-14 ENCOUNTER — Ambulatory Visit (INDEPENDENT_AMBULATORY_CARE_PROVIDER_SITE_OTHER): Payer: Medicare Other | Admitting: Orthopedic Surgery

## 2021-03-14 DIAGNOSIS — M25552 Pain in left hip: Secondary | ICD-10-CM

## 2021-03-14 DIAGNOSIS — M25559 Pain in unspecified hip: Secondary | ICD-10-CM

## 2021-03-14 NOTE — Telephone Encounter (Signed)
Called [name] back and asked her to refax the paperwork that she is asking about. Looked in [employer] box and did not see any paperwork regarding this patient. Gave fax number and call back number.  ?

## 2021-03-15 NOTE — Telephone Encounter (Signed)
[name] refaxed the paperwork. [name] phone number is [phone]. ?

## 2021-03-15 NOTE — Telephone Encounter (Signed)
Will confirm with [name]'s nurse that we have received the paper work and have [place] sign it and refax it back to [place].  ? ? ?

## 2021-03-16 ENCOUNTER — Encounter: Payer: Self-pay | Admitting: Orthopedic Surgery

## 2021-03-16 NOTE — Progress Notes (Signed)
Office Visit Note   Patient: [name]           Date of Birth: December 26, 1960           MRN: [id number] Visit Date: 03/14/2021 Requested by: Angela Garter, MD [address] [place],  [place] [address] PCP: Angela Garter, MD  Subjective: Chief Complaint  Patient presents with   Other     Scan review    HPI: [name] is a 61 year old patient with back and right leg pain.  Since she was last seen she had an MRI scan which is stable compared to August 2022.  She does have some lumbar degeneration affecting the facets at L3-4 and L4-5 and L5-S1.  No neural impingement.  [facility] has a physician [facility] who does injections in her back.  She has eye surgery pending for next Friday for duct work.  She has had a couple falls on the right-hand side.  Has been sore for 9 to 10 days.  Denies any groin pain.  Injections in the hip have helped her in the past.              ROS: All systems reviewed are negative as they relate to the chief complaint within the history of present illness.  Patient denies  fevers or chills.   Assessment & Plan: Visit Diagnoses:  1. Hip pain     Plan: Impression is right hip and back pain in a patient with fairly severe sarcoid.  Plan is right hip injection with Dr. Alvester Garrison in about 5 weeks.  Would like for her to get over her eye surgery first.  Follow-up with [place] as needed.  Follow-Up Instructions: No follow-ups on file.   Orders:  Orders Placed This Encounter  Procedures   XR HIP UNILAT W OR W/O PELVIS 2-3 VIEWS RIGHT   Ambulatory referral to Physical Medicine Rehab   No orders of the defined types were placed in this encounter.     Procedures: No procedures performed   Clinical Data: No additional findings.  Objective: Vital Signs: LMP 05/19/2003   Physical Exam:   Constitutional: Patient appears well-developed HEENT:  Head: Normocephalic Eyes:EOM are normal Neck: Normal range of motion Cardiovascular: Normal  rate Pulmonary/chest: Effort normal Neurologic: Patient is alert Skin: Skin is warm Psychiatric: Patient has normal mood and affect   Ortho Exam: Ortho exam demonstrates full active and passive range of motion of the knees and hips.  She has had bilateral knee replacements.  No effusion in either knee.  Extensor mechanism intact.  Not too much groin pain on the right left-hand side with internal/external rotation.  Hip flexor strength 5 out of 5 symmetrically.  No Trendelenburg gait on ambulation observation.  Specialty Comments:  No specialty comments available.  Imaging: No results found.   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) 11/19/2018   Oxygen dependent 11/19/2018   Family history of colon cancer 10/26/2018   Myringotomy tube status 08/21/2018   Vaginal candidiasis 07/15/2018   Nasal crusting 06/12/2018  Chronic serous otitis media of left ear 06/04/2018   Mixed conductive and sensorineural hearing loss of left ear with restricted hearing of right ear 06/04/2018   Chronic cholecystitis with calculus 02/11/2018   Thrush, oral 01/15/2018   Nasal sinus congestion 01/15/2018   Dyslipidemia 10/28/2017   Pruritus 10/23/2017   Abnormal liver function test    Anemia 09/24/2017   Migraines 09/17/2017   Cholecystitis, acute 09/12/2017   COPD (chronic obstructive pulmonary disease) (HCC) 12/30/2016   Diastolic dysfunction 11/11/2016   OSA (obstructive sleep apnea) 09/30/2016   Chronic respiratory failure with hypoxia (HCC) 08/23/2016    Pulmonary embolism (HCC) 07/21/2016   DVT (deep venous thrombosis) (HCC) 07/21/2016   Acute bronchitis 01/30/2016   Weight gain 12/20/2015   Controlled type 2 diabetes mellitus with chronic kidney disease, without long-term current use of insulin (HCC) 02/01/2015   Abnormal SPEP 06/04/2013   Memory loss 04/27/2013   Panic attacks 03/17/2013   Rash 10/27/2012   Pulmonary nodules 07/20/2012   CAD in native artery 05/28/2012   Seborrheic dermatitis 01/20/2012   Obesity, Class III, BMI 40-49.9 (morbid obesity) (HCC) 03/07/2010   INSOMNIA, CHRONIC 03/07/2010   Allergic rhinitis 10/26/2009   Grief reaction 08/01/2009   Essential hypertension 08/27/2007   B12 deficiency 08/25/2007   Vitamin D deficiency 08/25/2007   Urinary tract infection 02/07/2007   SINUSITIS, ACUTE 02/02/2007   Osteoarthritis 02/02/2007   Sarcoidosis 08/14/2006   Anxiety disorder 08/14/2006   Major depressive disorder, recurrent episode (HCC) 08/14/2006   GERD 08/14/2006   Past Medical History:  Diagnosis Date   Allergic rhinitis    ALLERGIC RHINITIS 10/26/2009   Anxiety    ANXIETY 08/14/2006   B12 DEFICIENCY 08/25/2007   Complication of anesthesia    pt has had difficulty following anesthesia with her knee in 2016-unable to care for herself afterward   Confusion    Depression    takes Lexapro daily   Diabetes mellitus without complication (HCC)    was on insulin but has been off since Nov 2015 and now only takes Metformin daily   DYSPNEA 04/28/2009   with exertion   Esophageal reflux    takes Nexium daily   Fibromyalgia    Headache    last migraine 2-83yrs ago;takes Topamax daily   History of shingles    Hypertension    takes Coreg daily   Insomnia    takes Nortriptyline nightly    Joint pain    Joint swelling    Left knee pain    Lichen planus    Long-term memory loss    Nocturia    OSA (obstructive sleep apnea)    doesn't use CPAP;sleep study in epic from 2006   Osteoarthritis     Osteoarthritis    Pneumonia    over 30 yrs ago   PONV (postoperative nausea and vomiting)    Protein calorie malnutrition (HCC)    Rheumatoid arthritis (HCC)    Sarcoidosis    Dr. Wilford Garrison   Short-term memory loss    Shortness of breath    Sleep apnea     wears oxygen   TIA (transient ischemic attack)    Unsteady gait    Urinary urgency    Vitamin D deficiency    is supposed to take Vit D but can't afford it   VITAMIN D DEFICIENCY 08/25/2007    Family History  Problem Relation Age of Onset   Heart disease Mother    Kidney disease Mother  renal failure   Cancer Father        leukemia   Hypertension Other    Coronary artery disease Other        female 1st degree relative <60   Heart failure Other        congestive   Coronary artery disease Other        Female 1st degree relative <50   Breast cancer Other        1st degree relative <50 S   Breast cancer Sister    Colon cancer Brother 78   Stroke Brother    Heart attack Brother    Cancer Brother 25       colon ca   Esophageal cancer Neg Hx    Stomach cancer Neg Hx    Rectal cancer Neg Hx     Past Surgical History:  Procedure Laterality Date   APPENDECTOMY     arthroscopic knee surgery Right 11-12-04   AXILLARY ABCESS IRRIGATION AND DEBRIDEMENT  Jul & [id number]   BIOPSY  11/02/2019   Procedure: BIOPSY;  Surgeon: Beverley Fiedler, MD;  Location: [facility];  Service: Gastroenterology;;   CARPAL TUNNEL RELEASE Left 05/23/2014   Procedure: CARPAL TUNNEL RELEASE;  Surgeon: Cammy Copa, MD;  Location: [facility] OR;  Service: Orthopedics;  Laterality: Left;   CHOLECYSTECTOMY N/A 02/11/2018   Procedure: LAPAROSCOPIC CHOLECYSTECTOMY WITH INTRAOPERATIVE CHOLANGIOGRAM ERAS PATHWAY;  Surgeon: Manus Rudd, MD;  Location: [facility] OR;  Service: General;  Laterality: N/A;   COLONOSCOPY WITH PROPOFOL N/A 11/02/2019   Procedure: COLONOSCOPY WITH PROPOFOL;  Surgeon: Beverley Fiedler, MD;  Location: [facility];  Service: Gastroenterology;   Laterality: N/A;   cyst removed from top of buttocks  at age 48   ENDOMETRIAL ABLATION     IR CHOLANGIOGRAM EXISTING TUBE  11/21/2017   IR CHOLANGIOGRAM EXISTING TUBE  01/16/2018   IR EXCHANGE BILIARY DRAIN  11/10/2017   IR PATIENT EVAL TECH 0-60 MINS  02/03/2018   IR PERC CHOLECYSTOSTOMY  09/13/2017   IR RADIOLOGIST EVAL & MGMT  10/08/2017   LACRIMAL DUCT EXPLORATION Right 06/26/2017   Procedure: LACRIMAL DUCT EXPLORATION AND ETHMOIDECTOMY;  Surgeon: Floydene Flock, MD;  Location: [facility] OR;  Service: Ophthalmology;  Laterality: Right;   POLYPECTOMY  11/02/2019   Procedure: POLYPECTOMY;  Surgeon: Beverley Fiedler, MD;  Location: [facility];  Service: Gastroenterology;;   TEAR DUCT PROBING Right 06/26/2017   Procedure: TEAR DUCT PROBING WITH STENT;  Surgeon: Floydene Flock, MD;  Location: [facility] OR;  Service: Ophthalmology;  Laterality: Right;   TOTAL KNEE ARTHROPLASTY Right 11/15/2014   Procedure: TOTAL RIGHT KNEE ARTHROPLASTY;  Surgeon: Cammy Copa, MD;  Location: [facility] OR;  Service: Orthopedics;  Laterality: Right;   TOTAL KNEE ARTHROPLASTY Left 07/13/2015   Procedure: LEFT TOTAL KNEE ARTHROPLASTY;  Surgeon: Cammy Copa, MD;  Location: [facility] OR;  Service: Orthopedics;  Laterality: Left;   Social History   Occupational History   Occupation: disabled  Tobacco Use   Smoking status: Former    Packs/day: 0.50    Years: 10.00    Pack years: 5.00    Types: Cigarettes    Start date: 1988    Quit date: 2004    Years since quitting: 19.1   Smokeless tobacco: Never   Tobacco comments:    quit smoking in 2004  Vaping Use   Vaping Use: Never used  Substance and Sexual Activity   Alcohol use: Not Currently    Alcohol/week: 1.0 standard  drink    Types: 1 Glasses of wine per week   Drug use: No   Sexual activity: Not Currently

## 2021-03-20 NOTE — Telephone Encounter (Signed)
Heather, have you seen any paperwork on this patient?  ?

## 2021-03-20 NOTE — Telephone Encounter (Signed)
Form located in stack of surgical clearance forms.  Last visit printed 02/01/2021 and will have Rexene Edison NP address when she returns on Wednesday 03/21/2021. ? ?Called [place] with [facility], advised that we no longer do surgical clearance forms, the provider will do a risk assessment and attach it to her last office visit which was on 02/01/2021 and then we can fax that to them.  Advised that Rexene Edison NP is out of the office today, however, I will have her address it first thing on Wednesday, 03/21/2021.  The biggest issue is the patient will need to hold her Alen Blew and they need the provider to determine if it is ok for her to hold that prior to the procedure. ? ?Tammy, ?Please advise on 03/21/2021, form and last OV are in your sign folder.   Thank you. ?

## 2021-03-20 NOTE — Progress Notes (Signed)
Surgical Instructions    Your procedure is scheduled on Friday March 10th.  Report to [facility] Main Entrance "A" at 10:30 A.M., then check in with the Admitting office.  Call this number if you have problems the morning of surgery:  [phone]   If you have any questions prior to your surgery date call [phone]: Open Monday-Friday 8am-4pm    Remember:  Do not eat after midnight the night before your surgery  You may drink clear liquids until 9:30am the morning of your surgery.   Clear liquids allowed are: Water, Non-Citrus Juices (without pulp), Carbonated Beverages, Clear Tea, Black Coffee ONLY (NO MILK, CREAM OR POWDERED CREAMER of any kind), and Gatorade    Take these medicines the morning of surgery with A SIP OF WATER: budesonide-formoterol (SYMBICORT) 160-4.5 MCG/ACT inhaler - please bring with you to the hospital carvedilol (COREG) 12.5 MG tablet dexlansoprazole (DEXILANT) 60 MG capsule topiramate (TOPAMAX) 50 MG tablet trimethoprim-polymyxin b (POLYTRIM) ophthalmic solution venlafaxine XR (EFFEXOR-XR) 75 MG 24 hr capsule  IF NEEDED  albuterol (VENTOLIN HFA) 108 (90 Base) MCG/ACT inhaler - please bring with you to the hospital gabapentin (NEURONTIN) 300 MG capsule hydrOXYzine (ATARAX) 25 MG tablet oxyCODONE-acetaminophen (PERCOCET) 10-325 MG tablet   Follow your surgeon's instructions on when to stop Xarelto.  If no instructions were given by your surgeon then you will need to call the office to get those instructions.     As of today, STOP taking any Aspirin (unless otherwise instructed by your surgeon) Aleve, Naproxen, Ibuprofen, Motrin, Advil, Goody's, BC's, all herbal medications, fish oil, and all vitamins.    WHAT DO I DO ABOUT MY DIABETES MEDICATION?   Do not take oral diabetes medicines (Metformin) the morning of surgery.   The day of surgery, do not take other diabetes injectables, including Byetta (exenatide), Bydureon (exenatide ER), Victoza  (liraglutide), or Trulicity (dulaglutide).    HOW TO MANAGE YOUR DIABETES BEFORE AND AFTER SURGERY  Why is it important to control my blood sugar before and after surgery? Improving blood sugar levels before and after surgery helps healing and can limit problems. A way of improving blood sugar control is eating a healthy diet by:  Eating less sugar and carbohydrates  Increasing activity/exercise  Talking with your doctor about reaching your blood sugar goals High blood sugars (greater than 180 mg/dL) can raise your risk of infections and slow your recovery, so you will need to focus on controlling your diabetes during the weeks before surgery. Make sure that the doctor who takes care of your diabetes knows about your planned surgery including the date and location.  How do I manage my blood sugar before surgery? Check your blood sugar at least 4 times a day, starting 2 days before surgery, to make sure that the level is not too high or low.  Check your blood sugar the morning of your surgery when you wake up and every 2 hours until you get to the Short Stay unit.  If your blood sugar is less than 70 mg/dL, you will need to treat for low blood sugar: Do not take insulin. Treat a low blood sugar (less than 70 mg/dL) with  cup of clear juice (cranberry or apple), 4 glucose tablets, OR glucose gel. Recheck blood sugar in 15 minutes after treatment (to make sure it is greater than 70 mg/dL). If your blood sugar is not greater than 70 mg/dL on recheck, call [phone] for further instructions. Report your blood sugar to the short stay  nurse when you get to Short Stay.  If you are admitted to the hospital after surgery: Your blood sugar will be checked by the staff and you will probably be given insulin after surgery (instead of oral diabetes medicines) to make sure you have good blood sugar levels. The goal for blood sugar control after surgery is 80-180 mg/dL.     DAY OF SURGERY       Do not wear jewelry or makeup Do not wear lotions, powders, perfumes, or deodorant. Do not shave 48 hours prior to surgery.   Do not bring valuables to the hospital. Do not wear nail polish, gel polish, artificial nails, or any other type of covering on natural nails (fingers and toes) If you have artificial nails or gel coating that need to be removed by a nail salon, please have this removed prior to surgery. Artificial nails or gel coating may interfere with anesthesia's ability to adequately monitor your vital signs.  [facility] is not responsible for any belongings or valuables. .   Do NOT Smoke (Tobacco/Vaping)  24 hours prior to your procedure  If you use a CPAP at night, you may bring your mask for your overnight stay.   Contacts, glasses, hearing aids, dentures or partials may not be worn into surgery, please bring cases for these belongings   For patients admitted to the hospital, discharge time will be determined by your treatment team.   Patients discharged the day of surgery will not be allowed to drive home, and someone needs to stay with them for 24 hours.  NO VISITORS WILL BE ALLOWED IN PRE-OP WHERE PATIENTS ARE PREPPED FOR SURGERY.  ONLY 1 SUPPORT PERSON MAY BE PRESENT IN THE WAITING ROOM WHILE YOU ARE IN SURGERY.  IF YOU ARE TO BE ADMITTED, ONCE YOU ARE IN YOUR ROOM YOU WILL BE ALLOWED TWO (2) VISITORS. 1 (ONE) VISITOR MAY STAY OVERNIGHT BUT MUST ARRIVE TO THE ROOM BY 8pm.  Minor children may have two parents present. Special consideration for safety and communication needs will be reviewed on a case by case basis.  Special instructions:    Oral Hygiene is also important to reduce your risk of infection.  Remember - BRUSH YOUR TEETH THE MORNING OF SURGERY WITH YOUR REGULAR TOOTHPASTE   [place]- Preparing For Surgery  Before surgery, you can play an important role. Because skin is not sterile, your skin needs to be as free of germs as possible. You can reduce the  number of germs on your skin by washing with CHG (chlorahexidine gluconate) Soap before surgery.  CHG is an antiseptic cleaner which kills germs and bonds with the skin to continue killing germs even after washing.     Please do not use if you have an allergy to CHG or antibacterial soaps. If your skin becomes reddened/irritated stop using the CHG.  Do not shave (including legs and underarms) for at least 48 hours prior to first CHG shower. It is OK to shave your face.  Please follow these instructions carefully.     Shower the NIGHT BEFORE SURGERY and the MORNING OF SURGERY with CHG Soap.   If you chose to wash your hair, wash your hair first as usual with your normal shampoo. After you shampoo, rinse your hair and body thoroughly to remove the shampoo.  Then [employer] and genitals (private parts) with your normal soap and rinse thoroughly to remove soap.  After that Use CHG Soap as you would any other liquid  soap. You can apply CHG directly to the skin and wash gently with a scrungie or a clean washcloth.   Apply the CHG Soap to your body ONLY FROM THE NECK DOWN.  Do not use on open wounds or open sores. Avoid contact with your eyes, ears, mouth and genitals (private parts). Wash Face and genitals (private parts)  with your normal soap.   Wash thoroughly, paying special attention to the area where your surgery will be performed.  Thoroughly rinse your body with warm water from the neck down.  DO NOT shower/wash with your normal soap after using and rinsing off the CHG Soap.  Pat yourself dry with a CLEAN TOWEL.  Wear CLEAN PAJAMAS to bed the night before surgery  Place CLEAN SHEETS on your bed the night before your surgery  DO NOT SLEEP WITH PETS.   Day of Surgery:  Take a shower with CHG soap. Wear Clean/Comfortable clothing the morning of surgery Do not apply any deodorants/lotions.   Remember to brush your teeth WITH YOUR REGULAR TOOTHPASTE.    COVID testing  If you are  going to stay overnight or be admitted after your procedure/surgery and require a pre-op COVID test, please follow these instructions after your COVID test   You are not required to quarantine however you are required to wear a well-fitting mask when you are out and around people not in your household.  If your mask becomes wet or soiled, replace with a new one.  Wash your hands often with soap and water for 20 seconds or clean your hands with an alcohol-based hand sanitizer that contains at least 60% alcohol.  Do not share personal items.  Notify your provider: if you are in close contact with someone who has COVID  or if you develop a fever of 100.4 or greater, sneezing, cough, sore throat, shortness of breath or body aches.    Please read over the following fact sheets that you were given.

## 2021-03-21 ENCOUNTER — Encounter (HOSPITAL_COMMUNITY): Payer: Self-pay

## 2021-03-21 ENCOUNTER — Other Ambulatory Visit: Payer: Self-pay

## 2021-03-21 ENCOUNTER — Encounter (HOSPITAL_COMMUNITY)
Admission: RE | Admit: 2021-03-21 | Discharge: 2021-03-21 | Disposition: A | Discharge status: Home / Self Care | Payer: Medicare Other | Source: Ambulatory Visit | Attending: Optometry | Admitting: Optometry

## 2021-03-21 VITALS — BP 163/84 | HR 64 | Temp 97.5°F | Resp 18 | Ht 67.0 in | Wt 267.0 lb

## 2021-03-21 DIAGNOSIS — I82409 Acute embolism and thrombosis of unspecified deep veins of unspecified lower extremity: Secondary | ICD-10-CM | POA: Diagnosis not present

## 2021-03-21 DIAGNOSIS — G4730 Sleep apnea, unspecified: Secondary | ICD-10-CM | POA: Insufficient documentation

## 2021-03-21 DIAGNOSIS — Z01818 Encounter for other preprocedural examination: Secondary | ICD-10-CM | POA: Diagnosis not present

## 2021-03-21 DIAGNOSIS — Z7901 Long term (current) use of anticoagulants: Secondary | ICD-10-CM | POA: Insufficient documentation

## 2021-03-21 DIAGNOSIS — Z20822 Contact with and (suspected) exposure to covid-19: Secondary | ICD-10-CM | POA: Diagnosis not present

## 2021-03-21 DIAGNOSIS — Z9981 Dependence on supplemental oxygen: Secondary | ICD-10-CM | POA: Insufficient documentation

## 2021-03-21 DIAGNOSIS — E119 Type 2 diabetes mellitus without complications: Secondary | ICD-10-CM | POA: Insufficient documentation

## 2021-03-21 DIAGNOSIS — Z01812 Encounter for preprocedural laboratory examination: Secondary | ICD-10-CM | POA: Diagnosis present

## 2021-03-21 DIAGNOSIS — D869 Sarcoidosis, unspecified: Secondary | ICD-10-CM | POA: Insufficient documentation

## 2021-03-21 DIAGNOSIS — Z87891 Personal history of nicotine dependence: Secondary | ICD-10-CM | POA: Insufficient documentation

## 2021-03-21 HISTORY — DX: Cerebral infarction, unspecified: I63.9

## 2021-03-21 LAB — BASIC METABOLIC PANEL
Anion gap: 9 (ref 5–15)
BUN: 23 mg/dL — ABNORMAL HIGH (ref 6–20)
CO2: 25 mmol/L (ref 22–32)
Calcium: 9.2 mg/dL (ref 8.9–10.3)
Chloride: 105 mmol/L (ref 98–111)
Creatinine, Ser: 0.9 mg/dL (ref 0.44–1.00)
GFR, Estimated: >60 mL/min (ref >60)
Glucose, Bld: 108 mg/dL — ABNORMAL HIGH (ref 70–99)
Potassium: 4.1 mmol/L (ref 3.5–5.1)
Sodium: 139 mmol/L (ref 135–145)

## 2021-03-21 LAB — CBC
HCT: 40.6 % (ref 36.0–46.0)
Hemoglobin: 12.6 g/dL (ref 12.0–15.0)
MCH: 29.9 pg (ref 26.0–34.0)
MCHC: 31 g/dL (ref 30.0–36.0)
MCV: 96.4 fL (ref 80.0–100.0)
Platelets: 215 10*3/uL (ref 150–400)
RBC: 4.21 MIL/uL (ref 3.87–5.11)
RDW: 14.6 % (ref 11.5–15.5)
WBC: 7.7 10*3/uL (ref 4.0–10.5)
nRBC: 0 % (ref 0.0–0.2)

## 2021-03-21 LAB — HEMOGLOBIN A1C
Hgb A1c MFr Bld: 5.1 % (ref 4.8–5.6)
Mean Plasma Glucose: 99.67 mg/dL

## 2021-03-21 LAB — SARS CORONAVIRUS 2 (TAT 6-24 HRS): SARS Coronavirus 2: NEGATIVE

## 2021-03-21 LAB — GLUCOSE, CAPILLARY: Glucose-Capillary: 111 mg/dL — ABNORMAL HIGH (ref 70–99)

## 2021-03-21 NOTE — Telephone Encounter (Signed)
Called and spoke with [name]. She is aware that [facility] has cleared the patient for surgery. I will print this phone encounter and fax to [phone], Annye Asa.  ? ?Nothing further needed at time of call.  ?

## 2021-03-21 NOTE — Telephone Encounter (Signed)
Surgical clearance form completed by Rexene Edison NP.  Form faxed to [facility]. ? ?Called and spoke with Caryl Pina and let her know that the form and OV had been faxed.  Nothing further needed. ?

## 2021-03-21 NOTE — Telephone Encounter (Signed)
Patient was last seen in the office February 01, 2021.  At that time her sarcoidosis was stable.  She is maintained on Symbicort and oxygen. ?Pulmonary function testing in July 2020 showed stable lung function with FEV1 at 70% and a DLCO at 61%. ?She is on lifelong anticoagulation therapy with Xarelto. ? ?Regarding preop pulmonary risk assessment.  Patient is considered a moderate to high risk due to her multiple comorbidities and underlying chronic respiratory failure.  Surgery is not excluded but she is considered a higher risk candidate.  ?[facility]  paperwork shows that patient will be under general anesthesia.  And will be done at [facility]. ? ? ?Major Pulmonary risks identified in the multifactorial risk analysis are but not limited to a) pneumonia; b) recurrent intubation risk; c) prolonged or recurrent acute respiratory failure needing mechanical ventilation; d) prolonged hospitalization; e) DVT/Pulmonary embolism; f) Acute Pulmonary edema ? ?Recommend ?1. Short duration of surgery as much as possible and avoid paralytic if possible ?2. Recovery in step down or ICU with Pulmonary consultation if indicated .  ?3.  Xarelto will need to be held 48  hours prior to surgery, and resume on third day postop total of 5 days (per ophthalmology recommendations) ?4. Aggressive pulmonary toilet with o2, bronchodilatation, and incentive spirometry and early ambulation. ? ?Please send this to [facility] and let patient know the above recommendations ? ? ? ? ?

## 2021-03-21 NOTE — Progress Notes (Signed)
PCP - Dr Alain Marion ?Cardiologist - Dr Jenkins Rouge ?Pulmonary - Dr Elsworth Soho ? ?Chest x-ray - n/a ?EKG - 03/21/21 ?Stress Test - n/a ?ECHO - n/a ?Cardiac Cath - n/a ? ?ICD Pacemaker/Loop - n/a ? ?Sleep Study -  Yes ?CPAP - does not use CPAP ? ?Do not take Metformin on the morning of surgery. ? ?If your blood sugar is less than 70 mg/dL, you will need to treat for low blood sugar: ?Treat a low blood sugar (less than 70 mg/dL) with ? cup of clear juice (cranberry or apple), 4 glucose tablets, OR glucose gel. ?Recheck blood sugar in 15 minutes after treatment (to make sure it is greater than 70 mg/dL). If your blood sugar is not greater than 70 mg/dL on recheck, call [phone] for further instructions. ? ?Blood Thinner Instructions:  Follow your surgeon's instructions on when to stop Xarelto prior to surgery.  Last dose was on 03/20/21. ? ?ERAS: Clear liquids til 9:30 AM dos. ? ?Anesthesia review: Yes ? ?STOP now taking any Aspirin (unless otherwise instructed by your surgeon), Aleve, Naproxen, Ibuprofen, Motrin, Advil, Goody's, BC's, all herbal medications, fish oil, and all vitamins.  ? ?Coronavirus Screening ?Covid test on 03/21/21 was 03/21/21 ?Do you have any of the following symptoms:  ?Cough yes/no: No ?Fever (>100.24F)  yes/no: No ?Runny nose yes/no: No ?Sore throat yes/no: No ?Difficulty breathing/shortness of breath  Yes- On Oxygen 2L ? ?Have you traveled in the last 14 days and where? yes/no: No ? ?Patient verbalized understanding of instructions that were given to them at the PAT appointment. Patient was also instructed that they will need to review over the PAT instructions again at home before surgery. ? ?

## 2021-03-22 ENCOUNTER — Ambulatory Visit (HOSPITAL_COMMUNITY): Admission: RE | Admit: 2021-03-22 | Payer: Medicare Other | Source: Home / Self Care | Admitting: Optometry

## 2021-03-22 ENCOUNTER — Ambulatory Visit: Payer: Medicare Other

## 2021-03-22 ENCOUNTER — Encounter (HOSPITAL_COMMUNITY): Payer: Self-pay | Admitting: Physician Assistant

## 2021-03-22 NOTE — Progress Notes (Signed)
Error in charting - duplicate ?

## 2021-03-22 NOTE — Progress Notes (Signed)
Anesthesia Chart Review: ? 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  History of chronic respiratory failure on oxygen at 2 L. ?sleep apnea history but CPAP intolerant.  Last seen by Patricia Nettle, NP on 02/01/2021.  No changes made to management.  Recommended 18-monthfollow-up.  Surgical clearance per telephone encounter 03/21/2021, "Patient was last seen in the office February 01, 2021.  At that time her sarcoidosis was stable.  She is maintained on Symbicort and oxygen. Pulmonary function testing in July 2020 showed stable lung function with FEV1 at 70% and a DLCO at 61%. She is on lifelong anticoagulation therapy with Xarelto. Regarding preop pulmonary risk assessment.  Patient is considered a moderate to high risk due to her multiple comorbidities and underlying chronic respiratory failure.  Surgery is not excluded but she is considered a higher risk candidate. [facility] shows that patient will be under general anesthesia.  And [facility] Major Pulmonary risks identified in the multifactorial risk analysis are but not limited to a) pneumonia; b) recurrent intubation risk; c) prolonged or recurrent acute respiratory failure needing mechanical ventilation; d) prolonged hospitalization; e) DVT/Pulmonary embolism; f) Acute Pulmonary edema. Recommend: 1. Short duration of surgery as much as possible and avoid paralytic if possible 2. Recovery in step down or ICU with Pulmonary consultation if indicated .  3.  Xarelto will need to be held 48  hours prior to surgery, and resume on third day postop total of 5 days (per ophthalmology recommendations) 4. Aggressive pulmonary toilet with o2, bronchodilatation, and incentive spirometry and early ambulation. [facility] and let patient know the above  recommendations." ? ?Preop labs reviewed, unremarkable.  DM2 well-controlled, A1c 5.1. ? ?EKG 03/21/2021: NSR.  Rate 67. ? ?CHEST - 2 VIEW 02/01/2021: ?COMPARISON:  05/15/2020 ?  ?FINDINGS: ?Cardiac shadow is stable. Aortic calcifications are noted. The lungs ?are well aerated bilaterally. No focal infiltrate or effusion is ?seen. Calcified mediastinal nodes are noted consistent with the ?given clinical history. Degenerative changes of the thoracic spine ?are noted. ?  ?IMPRESSION: ?Changes consistent with the given clinical history of sarcoidosis ?stable from the prior study. No acute abnormality noted. ? ?TTE 11/21/2016: ?- Left ventricle: The cavity size was normal. Wall thickness was  ?  increased in a pattern of mild LVH. Systolic function was normal.  ?  The estimated ejection fraction was in the range of 60% to 65%.  ?  Although no diagnostic regional wall motion abnormality was  ?  identified, this possibility cannot be completely excluded on the  ?  basis of this study. Doppler parameters are consistent with  ?  abnormal left ventricular relaxation (grade 1 diastolic  ?  dysfunction).  ?- Aortic valve: There was no stenosis.  ?- Mitral valve: There was no significant regurgitation.  ?- Right ventricle: The cavity size was normal. Systolic function  ?  was normal.  ?- Pulmonary arteries: No complete TR doppler jet so unable to  ?  estimate PA systolic pressure.  ?- Systemic veins: IVC not visualized.  ? ?Impressions:  ? ?- Normal LV size with mild LV hypertrophy. EF 60-65%. Normal RV  ?  size and systolic function. No significant valvular  ?  abnormalities.  ? ?Nuclear stress test 02/09/13:  ?- Low risk stress nuclear study with a small, mild, partially  reversible apical defect consistent with ischemia; findings could also be due to shifting breast attenuation.. ?- LV Ejection Fraction: 68%.  LV Wall Motion:  NL LV Function; NL Wall Motion ?Note: Dr. Johnsie Cancel commented on results stating, "low risk study breast  artifact no need for cath." ?  ? ?Karoline Caldwell, PA-C ?[facility]/Anesthesiology ?Phone [phone] ?03/22/2021 9:51 AM ? ?

## 2021-03-22 NOTE — Anesthesia Preprocedure Evaluation (Deleted)
Anesthesia Evaluation  ? ? ?Airway ? ? ? ? ? ? ? Dental ?  ?Pulmonary ?former smoker,  ?  ? ? ? ? ? ? ? Cardiovascular ?hypertension,  ? ? ?  ?Neuro/Psych ?  ? GI/Hepatic ?  ?Endo/Other  ?diabetes ? Renal/GU ?  ? ?  ?Musculoskeletal ? ? Abdominal ?  ?Peds ? Hematology ?  ?Anesthesia Other Findings ? ? Reproductive/Obstetrics ? ?  ? ? ? ? ? ? ? ? ? ? ? ? ? ?  ?  ? ? ? ? ? ? ? ? ?Anesthesia Physical ?Anesthesia Plan ? ?ASA:  ? ?Anesthesia Plan:   ? ?Post-op Pain Management:   ? ?Induction:  ? ?PONV Risk Score and Plan:  ? ?Airway Management Planned:  ? ?Additional Equipment:  ? ?Intra-op Plan:  ? ?Post-operative Plan:  ? ?Informed Consent:  ? ?Plan Discussed with:  ? 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 ?History of chronic respiratory failure on oxygen at 2 L. ?sleep apnea history but CPAP intolerant.  Last seen by Patricia Nettle, NP on 02/01/2021.  No changes made to management.  Recommended 46-monthfollow-up.  Surgical clearance per telephone encounter 03/21/2021, "Patient was last seen in the office February 01, 2021. ?At that time her sarcoidosis was stable. ?She is maintained on Symbicort and oxygen. Pulmonary function testing in July 2020 showed stable lung function with FEV1 at 70% and a DLCO at 61%. She is on lifelong anticoagulation therapy with Xarelto. Regarding preop pulmonary risk assessment. ?Patient is considered a moderate to high risk due to her multiple comorbidities and underlying chronic respiratory failure. ?Surgery is not excluded but she is considered a higher risk candidate. [facility]work shows that patient will be under general anesthesia. ?[facility] Major Pulmonary risks identified in the multifactorial risk  analysis are but not limited to a) pneumonia; b) recurrent intubation risk; c) prolonged or recurrent acute respiratory failure needing mechanical ventilation; d) prolonged hospitalization; e) DVT/Pulmonary embolism; f) Acute Pulmonary edema. Recommend: 1. Short duration of surgery as much as possible and avoid paralytic if possible 2. Recovery in step down or ICU with Pulmonary consultation?if indicated .? 3.??Xarelto will need to be held 48??hours prior to surgery,?and resume on third day postop total of 5 days (per ophthalmology recommendations) 4. Aggressive pulmonary toilet with o2, bronchodilatation, and incentive spirometry and early ambulation. [facility] and let patient know the above recommendations." ? ?Preop labs reviewed, unremarkable.  DM2 well-controlled, A1c 5.1. ? ?EKG 03/21/2021: NSR.  Rate 67. ? ?CHEST - 2 VIEW 02/01/2021: ?COMPARISON: ?05/15/2020 ?? ?FINDINGS: ?Cardiac shadow is stable. Aortic calcifications are noted. The lungs ?are well aerated bilaterally. No focal infiltrate or effusion is ?seen. Calcified mediastinal nodes are noted consistent with the ?given clinical history. Degenerative changes of the thoracic spine ?are noted. ?? ?IMPRESSION: ?Changes consistent with the given clinical history of sarcoidosis ?stable from the prior study. No acute abnormality noted. ? ?TTE 11/21/2016: ?- Left ventricle: The cavity size was normal. Wall thickness was  ???increased in a pattern of mild LVH. Systolic function was normal.  ???The estimated ejection fraction was in the range of 60% to 65%.  ???Although no diagnostic regional wall motion abnormality was  ???identified, this possibility cannot be completely excluded on the  ???basis  of this study. Doppler parameters are consistent with  ???abnormal left ventricular relaxation (grade 1 diastolic  ???dysfunction).  ?- Aortic valve: There was no stenosis.  ?- Mitral valve: There was no significant regurgitation.  ?-  Right ventricle: The cavity size was normal. Systolic function  ???was normal.  ?- Pulmonary arteries: No complete TR doppler jet so unable to  ???estimate PA systolic pressure.  ?- Systemic veins: IVC not visualized.  ? ?Impressions:  ? ?- Normal LV size with mild LV hypertrophy. EF 60-65%. Normal RV  ???size and systolic function. No significant valvular  ???abnormalities.  ? ?Nuclear stress test 02/09/13:  ?-?Low risk stress nuclear study with a small, mild, partially reversible apical defect consistent with ischemia; findings could also be due to shifting breast attenuation.. ?-?LV Ejection Fraction: 68%. ?LV Wall Motion: ?NL LV Function; NL Wall Motion ?Note: Dr. Johnsie Cancel commented on results stating, "low risk study breast artifact no need for cath." ?? ?)  ? ? ? ? ? ? ?Anesthesia Quick Evaluation ? ?

## 2021-03-23 ENCOUNTER — Other Ambulatory Visit: Payer: Self-pay

## 2021-03-23 ENCOUNTER — Encounter (HOSPITAL_COMMUNITY)
Admission: RE | Disposition: A | Discharge status: Home / Self Care | Payer: Self-pay | Source: Ambulatory Visit | Attending: Internal Medicine

## 2021-03-23 ENCOUNTER — Observation Stay (HOSPITAL_COMMUNITY)
Admission: RE | Admit: 2021-03-23 | Discharge: 2021-03-24 | Disposition: A | Discharge status: Home / Self Care | Payer: Medicare Other | Source: Ambulatory Visit | Attending: Internal Medicine | Admitting: Internal Medicine

## 2021-03-23 ENCOUNTER — Ambulatory Visit (HOSPITAL_COMMUNITY): Payer: Medicare Other | Admitting: Certified Registered"

## 2021-03-23 ENCOUNTER — Encounter (HOSPITAL_COMMUNITY): Payer: Self-pay | Admitting: Optometry

## 2021-03-23 ENCOUNTER — Ambulatory Visit (HOSPITAL_BASED_OUTPATIENT_CLINIC_OR_DEPARTMENT_OTHER): Payer: Medicare Other | Admitting: Certified Registered"

## 2021-03-23 DIAGNOSIS — D869 Sarcoidosis, unspecified: Secondary | ICD-10-CM | POA: Diagnosis present

## 2021-03-23 DIAGNOSIS — F0390 Unspecified dementia without behavioral disturbance: Secondary | ICD-10-CM | POA: Insufficient documentation

## 2021-03-23 DIAGNOSIS — G894 Chronic pain syndrome: Secondary | ICD-10-CM | POA: Diagnosis present

## 2021-03-23 DIAGNOSIS — E66813 Obesity, class 3: Secondary | ICD-10-CM | POA: Diagnosis present

## 2021-03-23 DIAGNOSIS — Z91040 Latex allergy status: Secondary | ICD-10-CM | POA: Diagnosis not present

## 2021-03-23 DIAGNOSIS — Z7984 Long term (current) use of oral hypoglycemic drugs: Secondary | ICD-10-CM | POA: Insufficient documentation

## 2021-03-23 DIAGNOSIS — J449 Chronic obstructive pulmonary disease, unspecified: Secondary | ICD-10-CM

## 2021-03-23 DIAGNOSIS — H04553 Acquired stenosis of bilateral nasolacrimal duct: Secondary | ICD-10-CM | POA: Diagnosis not present

## 2021-03-23 DIAGNOSIS — I10 Essential (primary) hypertension: Secondary | ICD-10-CM | POA: Diagnosis not present

## 2021-03-23 DIAGNOSIS — Z87891 Personal history of nicotine dependence: Secondary | ICD-10-CM | POA: Diagnosis not present

## 2021-03-23 DIAGNOSIS — Z96653 Presence of artificial knee joint, bilateral: Secondary | ICD-10-CM | POA: Insufficient documentation

## 2021-03-23 DIAGNOSIS — E119 Type 2 diabetes mellitus without complications: Secondary | ICD-10-CM

## 2021-03-23 DIAGNOSIS — G4733 Obstructive sleep apnea (adult) (pediatric): Secondary | ICD-10-CM | POA: Diagnosis not present

## 2021-03-23 DIAGNOSIS — Z8673 Personal history of transient ischemic attack (TIA), and cerebral infarction without residual deficits: Secondary | ICD-10-CM | POA: Insufficient documentation

## 2021-03-23 DIAGNOSIS — I2699 Other pulmonary embolism without acute cor pulmonale: Secondary | ICD-10-CM | POA: Diagnosis present

## 2021-03-23 DIAGNOSIS — Z794 Long term (current) use of insulin: Secondary | ICD-10-CM | POA: Diagnosis not present

## 2021-03-23 DIAGNOSIS — J329 Chronic sinusitis, unspecified: Secondary | ICD-10-CM | POA: Diagnosis not present

## 2021-03-23 DIAGNOSIS — Z79899 Other long term (current) drug therapy: Secondary | ICD-10-CM | POA: Insufficient documentation

## 2021-03-23 DIAGNOSIS — F419 Anxiety disorder, unspecified: Secondary | ICD-10-CM | POA: Diagnosis present

## 2021-03-23 DIAGNOSIS — H04302 Unspecified dacryocystitis of left lacrimal passage: Secondary | ICD-10-CM | POA: Diagnosis not present

## 2021-03-23 DIAGNOSIS — E1122 Type 2 diabetes mellitus with diabetic chronic kidney disease: Secondary | ICD-10-CM | POA: Diagnosis present

## 2021-03-23 HISTORY — PX: LACRIMAL TUBE INSERTION: SHX1905

## 2021-03-23 HISTORY — PX: DACRORHINOCYSTOTOMY: SHX5559

## 2021-03-23 HISTORY — PX: LACRIMAL DUCT EXPLORATION: SHX6569

## 2021-03-23 LAB — GLUCOSE, CAPILLARY
Glucose-Capillary: 121 mg/dL — ABNORMAL HIGH (ref 70–99)
Glucose-Capillary: 149 mg/dL — ABNORMAL HIGH (ref 70–99)
Glucose-Capillary: 129 mg/dL — ABNORMAL HIGH (ref 70–99)

## 2021-03-23 SURGERY — DACRYOCYSTORHINOSTOMY
Anesthesia: General | Site: Nose | Laterality: Bilateral

## 2021-03-23 MED ORDER — OXYMETAZOLINE HCL 0.05 % NA SOLN
NASAL | Status: AC
  Filled 2021-03-23: qty 30

## 2021-03-23 MED ORDER — PANTOPRAZOLE SODIUM 40 MG PO TBEC
40.0000 mg | DELAYED_RELEASE_TABLET | Freq: Every day | ORAL | Status: DC
  Administered 2021-03-24: 40 mg via ORAL
  Filled 2021-03-23: qty 1

## 2021-03-23 MED ORDER — AMOXICILLIN-POT CLAVULANATE 875-125 MG PO TABS
1.0000 | ORAL_TABLET | Freq: Two times a day (BID) | ORAL | Status: DC
  Administered 2021-03-23 – 2021-03-24 (×2): 1 via ORAL
  Filled 2021-03-23 (×2): qty 1

## 2021-03-23 MED ORDER — 0.9 % SODIUM CHLORIDE (POUR BTL) OPTIME
TOPICAL | Status: DC | PRN
  Administered 2021-03-23: 1000 mL

## 2021-03-23 MED ORDER — POLYETHYLENE GLYCOL 3350 17 G PO PACK: 17.0000 g | PACK | Freq: Every day | ORAL | Status: DC | PRN

## 2021-03-23 MED ORDER — CLONAZEPAM 0.5 MG PO TABS: 0.2500 mg | ORAL_TABLET | Freq: Every day | ORAL | Status: DC

## 2021-03-23 MED ORDER — MOMETASONE FURO-FORMOTEROL FUM 200-5 MCG/ACT IN AERO: 2.0000 | INHALATION_SPRAY | Freq: Two times a day (BID) | RESPIRATORY_TRACT | Status: DC

## 2021-03-23 MED ORDER — TOPIRAMATE 25 MG PO TABS
50.0000 mg | ORAL_TABLET | Freq: Two times a day (BID) | ORAL | Status: DC
  Administered 2021-03-23 – 2021-03-24 (×2): 50 mg via ORAL
  Filled 2021-03-23 (×3): qty 2

## 2021-03-23 MED ORDER — SUCCINYLCHOLINE CHLORIDE 200 MG/10ML IV SOSY
PREFILLED_SYRINGE | INTRAVENOUS | Status: DC | PRN
  Administered 2021-03-23: 100 mg via INTRAVENOUS

## 2021-03-23 MED ORDER — VENLAFAXINE HCL ER 75 MG PO CP24
75.0000 mg | ORAL_CAPSULE | Freq: Every day | ORAL | Status: DC
  Administered 2021-03-24: 75 mg via ORAL
  Filled 2021-03-23: qty 1

## 2021-03-23 MED ORDER — DEXAMETHASONE SODIUM PHOSPHATE 10 MG/ML IJ SOLN
INTRAMUSCULAR | Status: AC
  Filled 2021-03-23: qty 1

## 2021-03-23 MED ORDER — DOCUSATE SODIUM 100 MG PO CAPS
100.0000 mg | ORAL_CAPSULE | Freq: Two times a day (BID) | ORAL | Status: DC
  Administered 2021-03-23 – 2021-03-24 (×2): 100 mg via ORAL
  Filled 2021-03-23 (×2): qty 1

## 2021-03-23 MED ORDER — MIDAZOLAM HCL 2 MG/2ML IJ SOLN
INTRAMUSCULAR | Status: DC | PRN
  Administered 2021-03-23: 1 mg via INTRAVENOUS

## 2021-03-23 MED ORDER — LIDOCAINE 2% (20 MG/ML) 5 ML SYRINGE
INTRAMUSCULAR | Status: DC | PRN
  Administered 2021-03-23: 40 mg via INTRAVENOUS

## 2021-03-23 MED ORDER — FENTANYL CITRATE (PF) 100 MCG/2ML IJ SOLN
25.0000 ug | INTRAMUSCULAR | Status: DC | PRN
  Administered 2021-03-23: 25 ug via INTRAVENOUS
  Administered 2021-03-23: 50 ug via INTRAVENOUS
  Administered 2021-03-23: 25 ug via INTRAVENOUS

## 2021-03-23 MED ORDER — AMOXICILLIN-POT CLAVULANATE 875-125 MG PO TABS: 1.0000 | ORAL_TABLET | Freq: Two times a day (BID) | ORAL | Status: DC

## 2021-03-23 MED ORDER — NEOMYCIN-POLYMYXIN-DEXAMETH 3.5-10000-0.1 OP SUSP
1.0000 [drp] | Freq: Four times a day (QID) | OPHTHALMIC | Status: DC
  Administered 2021-03-23 – 2021-03-24 (×4): 1 [drp] via OPHTHALMIC
  Filled 2021-03-23: qty 5

## 2021-03-23 MED ORDER — OXYMETAZOLINE HCL 0.05 % NA SOLN
NASAL | Status: DC | PRN
  Administered 2021-03-23: 1 via TOPICAL

## 2021-03-23 MED ORDER — LACTATED RINGERS IV SOLN: INTRAVENOUS | Status: DC | PRN

## 2021-03-23 MED ORDER — ALBUTEROL SULFATE HFA 108 (90 BASE) MCG/ACT IN AERS: 2.0000 | INHALATION_SPRAY | Freq: Four times a day (QID) | RESPIRATORY_TRACT | Status: DC | PRN

## 2021-03-23 MED ORDER — DIPHENHYDRAMINE HCL 25 MG PO CAPS: 25.0000 mg | ORAL_CAPSULE | Freq: Four times a day (QID) | ORAL | Status: DC | PRN

## 2021-03-23 MED ORDER — STERILE WATER FOR IRRIGATION IR SOLN
Status: DC | PRN
  Administered 2021-03-23: 400 mL

## 2021-03-23 MED ORDER — INSULIN ASPART 100 UNIT/ML IJ SOLN: 0.0000 [IU] | Freq: Every day | INTRAMUSCULAR | Status: DC

## 2021-03-23 MED ORDER — LIDOCAINE-EPINEPHRINE 1 %-1:100000 IJ SOLN
INTRAMUSCULAR | Status: DC | PRN
  Administered 2021-03-23: 5 mL

## 2021-03-23 MED ORDER — SUCCINYLCHOLINE CHLORIDE 200 MG/10ML IV SOSY
PREFILLED_SYRINGE | INTRAVENOUS | Status: AC
  Filled 2021-03-23: qty 10

## 2021-03-23 MED ORDER — INSULIN ASPART 100 UNIT/ML IJ SOLN: 0.0000 [IU] | INTRAMUSCULAR | Status: DC | PRN

## 2021-03-23 MED ORDER — OXYCODONE HCL 5 MG PO TABS: 5.0000 mg | ORAL_TABLET | Freq: Once | ORAL | Status: DC | PRN

## 2021-03-23 MED ORDER — MIDAZOLAM HCL 2 MG/2ML IJ SOLN
INTRAMUSCULAR | Status: AC
  Filled 2021-03-23: qty 2

## 2021-03-23 MED ORDER — ACETAMINOPHEN 325 MG PO TABS
650.0000 mg | ORAL_TABLET | Freq: Four times a day (QID) | ORAL | Status: DC | PRN
  Filled 2021-03-23: qty 2

## 2021-03-23 MED ORDER — ACETAMINOPHEN 650 MG RE SUPP: 650.0000 mg | Freq: Four times a day (QID) | RECTAL | Status: DC | PRN

## 2021-03-23 MED ORDER — ORAL CARE MOUTH RINSE: 15.0000 mL | Freq: Once | OROMUCOSAL | Status: AC

## 2021-03-23 MED ORDER — ALBUTEROL SULFATE (2.5 MG/3ML) 0.083% IN NEBU: 2.5000 mg | INHALATION_SOLUTION | Freq: Four times a day (QID) | RESPIRATORY_TRACT | Status: DC | PRN

## 2021-03-23 MED ORDER — ONDANSETRON HCL 4 MG/2ML IJ SOLN: 4.0000 mg | Freq: Four times a day (QID) | INTRAMUSCULAR | Status: DC | PRN

## 2021-03-23 MED ORDER — FENTANYL CITRATE (PF) 250 MCG/5ML IJ SOLN
INTRAMUSCULAR | Status: AC
  Filled 2021-03-23: qty 5

## 2021-03-23 MED ORDER — BACITRACIN ZINC 500 UNIT/GM EX OINT
TOPICAL_OINTMENT | CUTANEOUS | Status: AC
  Filled 2021-03-23: qty 28.35

## 2021-03-23 MED ORDER — DEXAMETHASONE SODIUM PHOSPHATE 10 MG/ML IJ SOLN
INTRAMUSCULAR | Status: DC | PRN
  Administered 2021-03-23: 4 mg via INTRAVENOUS

## 2021-03-23 MED ORDER — INSULIN ASPART 100 UNIT/ML IJ SOLN
0.0000 [IU] | Freq: Three times a day (TID) | INTRAMUSCULAR | Status: DC
  Administered 2021-03-23: 3 [IU] via SUBCUTANEOUS
  Administered 2021-03-24: 2 [IU] via SUBCUTANEOUS

## 2021-03-23 MED ORDER — NEOMYCIN-POLYMYXIN-DEXAMETH 3.5-10000-0.1 OP SUSP: 1.0000 [drp] | Freq: Four times a day (QID) | OPHTHALMIC | Status: DC

## 2021-03-23 MED ORDER — BUPIVACAINE HCL (PF) 0.75 % IJ SOLN
INTRAMUSCULAR | Status: AC
  Filled 2021-03-23: qty 10

## 2021-03-23 MED ORDER — PROPOFOL 10 MG/ML IV BOLUS
INTRAVENOUS | Status: AC
  Filled 2021-03-23: qty 20

## 2021-03-23 MED ORDER — DOXYCYCLINE HYCLATE 100 MG PO TABS: 100.0000 mg | ORAL_TABLET | Freq: Two times a day (BID) | ORAL | Status: DC

## 2021-03-23 MED ORDER — MORPHINE SULFATE (PF) 2 MG/ML IV SOLN: 2.0000 mg | INTRAVENOUS | Status: DC | PRN

## 2021-03-23 MED ORDER — GABAPENTIN 300 MG PO CAPS
300.0000 mg | ORAL_CAPSULE | Freq: Every day | ORAL | Status: DC
  Administered 2021-03-23: 300 mg via ORAL
  Filled 2021-03-23: qty 1

## 2021-03-23 MED ORDER — OXYMETAZOLINE HCL 0.05 % NA SOLN
1.0000 | Freq: Once | NASAL | Status: AC
  Administered 2021-03-23 (×2): 1 via NASAL

## 2021-03-23 MED ORDER — BSS IO SOLN
INTRAOCULAR | Status: AC
  Filled 2021-03-23: qty 15

## 2021-03-23 MED ORDER — CARVEDILOL 12.5 MG PO TABS
12.5000 mg | ORAL_TABLET | Freq: Two times a day (BID) | ORAL | Status: DC
  Administered 2021-03-23 – 2021-03-24 (×2): 12.5 mg via ORAL
  Filled 2021-03-23 (×2): qty 1

## 2021-03-23 MED ORDER — ONDANSETRON HCL 4 MG/2ML IJ SOLN
INTRAMUSCULAR | Status: AC
  Filled 2021-03-23: qty 2

## 2021-03-23 MED ORDER — PROPOFOL 1000 MG/100ML IV EMUL
INTRAVENOUS | Status: AC
  Filled 2021-03-23: qty 100

## 2021-03-23 MED ORDER — CEFAZOLIN SODIUM-DEXTROSE 2-3 GM-%(50ML) IV SOLR
INTRAVENOUS | Status: DC | PRN
  Administered 2021-03-23: 2 g via INTRAVENOUS

## 2021-03-23 MED ORDER — BISACODYL 5 MG PO TBEC: 5.0000 mg | DELAYED_RELEASE_TABLET | Freq: Every day | ORAL | Status: DC | PRN

## 2021-03-23 MED ORDER — BSS IO SOLN
INTRAOCULAR | Status: DC | PRN
  Administered 2021-03-23: 15 mL via INTRAOCULAR

## 2021-03-23 MED ORDER — PROPOFOL 10 MG/ML IV BOLUS
INTRAVENOUS | Status: DC | PRN
  Administered 2021-03-23: 100 mg via INTRAVENOUS
  Administered 2021-03-23: 50 mg via INTRAVENOUS

## 2021-03-23 MED ORDER — ERYTHROMYCIN 5 MG/GM OP OINT
TOPICAL_OINTMENT | OPHTHALMIC | Status: AC
  Filled 2021-03-23: qty 3.5

## 2021-03-23 MED ORDER — ROCURONIUM BROMIDE 10 MG/ML (PF) SYRINGE
PREFILLED_SYRINGE | INTRAVENOUS | Status: AC
  Filled 2021-03-23: qty 10

## 2021-03-23 MED ORDER — HEMOSTATIC AGENTS (NO CHARGE) OPTIME
TOPICAL | Status: DC | PRN
  Administered 2021-03-23: 1 via TOPICAL

## 2021-03-23 MED ORDER — TRIAMCINOLONE ACETONIDE 40 MG/ML IJ SUSP
INTRAMUSCULAR | Status: AC
  Filled 2021-03-23: qty 5

## 2021-03-23 MED ORDER — DOCUSATE SODIUM 100 MG PO CAPS: 100.0000 mg | ORAL_CAPSULE | Freq: Two times a day (BID) | ORAL | Status: DC

## 2021-03-23 MED ORDER — SODIUM CHLORIDE 0.9% FLUSH
3.0000 mL | Freq: Two times a day (BID) | INTRAVENOUS | Status: DC
  Administered 2021-03-23 – 2021-03-24 (×2): 3 mL via INTRAVENOUS

## 2021-03-23 MED ORDER — OXYCODONE HCL 5 MG PO TABS
5.0000 mg | ORAL_TABLET | ORAL | Status: DC | PRN
  Administered 2021-03-23: 5 mg via ORAL
  Filled 2021-03-23: qty 1

## 2021-03-23 MED ORDER — OXYMETAZOLINE HCL 0.05 % NA SOLN
NASAL | Status: AC
  Administered 2021-03-23: 1 via NASAL
  Filled 2021-03-23: qty 30

## 2021-03-23 MED ORDER — ONDANSETRON HCL 4 MG/2ML IJ SOLN
INTRAMUSCULAR | Status: DC | PRN
  Administered 2021-03-23: 4 mg via INTRAVENOUS

## 2021-03-23 MED ORDER — ONDANSETRON HCL 4 MG PO TABS
4.0000 mg | ORAL_TABLET | Freq: Four times a day (QID) | ORAL | Status: DC | PRN
Start: 2021-03-23 — End: 2021-03-24

## 2021-03-23 MED ORDER — LACTATED RINGERS IV SOLN: INTRAVENOUS | Status: DC

## 2021-03-23 MED ORDER — POLYVINYL ALCOHOL 1.4 % OP SOLN
1.0000 [drp] | OPHTHALMIC | Status: DC | PRN
  Administered 2021-03-23: 1 [drp] via OPHTHALMIC
  Filled 2021-03-23: qty 15

## 2021-03-23 MED ORDER — CHLORHEXIDINE GLUCONATE 0.12 % MT SOLN
15.0000 mL | Freq: Once | OROMUCOSAL | Status: AC
  Administered 2021-03-23: 15 mL via OROMUCOSAL
  Filled 2021-03-23: qty 15

## 2021-03-23 MED ORDER — NEOMYCIN-POLYMYXIN-DEXAMETH 3.5-10000-0.1 OP OINT
TOPICAL_OINTMENT | OPHTHALMIC | Status: AC
  Filled 2021-03-23: qty 3.5

## 2021-03-23 MED ORDER — OXYCODONE HCL 5 MG/5ML PO SOLN: 5.0000 mg | Freq: Once | ORAL | Status: DC | PRN

## 2021-03-23 MED ORDER — TRIAMCINOLONE ACETONIDE 40 MG/ML IJ SUSP
INTRAMUSCULAR | Status: DC | PRN
  Administered 2021-03-23: 200 mg

## 2021-03-23 MED ORDER — HYDRALAZINE HCL 20 MG/ML IJ SOLN: 5.0000 mg | INTRAMUSCULAR | Status: DC | PRN

## 2021-03-23 MED ORDER — CLONAZEPAM 0.25 MG PO TBDP
0.2500 mg | ORAL_TABLET | Freq: Every day | ORAL | Status: DC
  Administered 2021-03-23: 0.25 mg via ORAL
  Filled 2021-03-23: qty 1

## 2021-03-23 MED ORDER — LIDOCAINE 2% (20 MG/ML) 5 ML SYRINGE
INTRAMUSCULAR | Status: AC
  Filled 2021-03-23: qty 5

## 2021-03-23 MED ORDER — FENTANYL CITRATE (PF) 250 MCG/5ML IJ SOLN
INTRAMUSCULAR | Status: DC | PRN
  Administered 2021-03-23: 50 ug via INTRAVENOUS
  Administered 2021-03-23: 100 ug via INTRAVENOUS
  Administered 2021-03-23: 50 ug via INTRAVENOUS

## 2021-03-23 MED ORDER — FENTANYL CITRATE (PF) 100 MCG/2ML IJ SOLN
INTRAMUSCULAR | Status: AC
  Filled 2021-03-23: qty 2

## 2021-03-23 MED ORDER — MOMETASONE FURO-FORMOTEROL FUM 200-5 MCG/ACT IN AERO
2.0000 | INHALATION_SPRAY | Freq: Two times a day (BID) | RESPIRATORY_TRACT | Status: DC
  Administered 2021-03-24: 2 via RESPIRATORY_TRACT
  Filled 2021-03-23: qty 8.8

## 2021-03-23 MED ORDER — LIDOCAINE-EPINEPHRINE 1 %-1:100000 IJ SOLN
INTRAMUSCULAR | Status: AC
  Filled 2021-03-23: qty 1

## 2021-03-23 MED ORDER — HYDROXYZINE HCL 25 MG PO TABS: 25.0000 mg | ORAL_TABLET | Freq: Three times a day (TID) | ORAL | Status: DC | PRN

## 2021-03-23 SURGICAL SUPPLY — 37 items
BAG COUNTER SPONGE SURGICOUNT (BAG) ×2 IMPLANT
BAG SURGICOUNT SPONGE COUNTING (BAG) ×1
BLADE SURG 15 STRL LF DISP TIS (BLADE) ×1 IMPLANT
BLADE SURG 15 STRL SS (BLADE) ×3
BUR DIAMOND COARSE 3.0 (BURR) ×2 IMPLANT
COAGULATOR SUCT 8FR VV (MISCELLANEOUS) ×2 IMPLANT
CORD BIPOLAR FORCEPS 12FT (ELECTRODE) ×3 IMPLANT
COVER SURGICAL LIGHT HANDLE (MISCELLANEOUS) ×3 IMPLANT
DEFOGGER ANTIFOG KIT (MISCELLANEOUS) ×2 IMPLANT
DRAPE ORTHO SPLIT 77X108 STRL (DRAPES) ×3
DRAPE SURG ORHT 6 SPLT 77X108 (DRAPES) ×1 IMPLANT
ELECT REM PT RETURN 9FT ADLT (ELECTROSURGICAL) ×3
ELECTRODE REM PT RTRN 9FT ADLT (ELECTROSURGICAL) IMPLANT
FORCEPS BIPOLAR SPETZLER 8 1.0 (NEUROSURGERY SUPPLIES) ×3 IMPLANT
GAUZE 4X4 16PLY ~~LOC~~+RFID DBL (SPONGE) ×3 IMPLANT
GLOVE SURG SYN 7.5  E (GLOVE) ×3
GLOVE SURG SYN 7.5 E (GLOVE) ×1 IMPLANT
GLOVE SURG SYN 7.5 PF PI (GLOVE) ×1 IMPLANT
GOWN STRL REUS W/ TWL LRG LVL3 (GOWN DISPOSABLE) ×2 IMPLANT
GOWN STRL REUS W/TWL LRG LVL3 (GOWN DISPOSABLE) ×6
KIT BASIN OR (CUSTOM PROCEDURE TRAY) ×3 IMPLANT
KNIFE CRESCENT 1.75 EDGEAHEAD (BLADE) ×7 IMPLANT
NDL PRECISIONGLIDE 27X1.5 (NEEDLE) ×2 IMPLANT
NEEDLE PRECISIONGLIDE 27X1.5 (NEEDLE) ×3 IMPLANT
PACK CATARACT CUSTOM (CUSTOM PROCEDURE TRAY) ×3 IMPLANT
PAD ARMBOARD 7.5X6 YLW CONV (MISCELLANEOUS) ×6 IMPLANT
PATTIES SURGICAL .5 X3 (DISPOSABLE) ×3 IMPLANT
SET INTBT LACRIMAL .016X.025 (DRAIN) ×4 IMPLANT
SPONGE SURGIFOAM ABS GEL 12-7 (HEMOSTASIS) ×7 IMPLANT
SUT PROLENE 6 0 P 3 18 (SUTURE) ×3 IMPLANT
SWAB COLLECTION DEVICE MRSA (MISCELLANEOUS) ×2 IMPLANT
SWAB CULTURE ESWAB REG 1ML (MISCELLANEOUS) ×2 IMPLANT
TOWEL GREEN STERILE FF (TOWEL DISPOSABLE) ×6 IMPLANT
TUBE CONNECTING 12'X1/4 (SUCTIONS) ×1
TUBE CONNECTING 12X1/4 (SUCTIONS) ×2 IMPLANT
TUBING EXTENTION W/L.L. (IV SETS) ×2 IMPLANT
WATER STERILE IRR 1000ML POUR (IV SOLUTION) ×3 IMPLANT

## 2021-03-23 NOTE — Anesthesia Preprocedure Evaluation (Signed)
Anesthesia Evaluation  ?Patient identified by MRN, date of birth, ID band ?Patient awake ? ? ? ?Reviewed: ?Allergy & Precautions, H&P , NPO status , Patient's Chart, lab work & pertinent test results ? ?History of Anesthesia Complications ?(+) PONV ? ?Airway ?Mallampati: II ? ? ?Neck ROM: full ? ? ? Dental ?  ?Pulmonary ?shortness of breath, sleep apnea , COPD, former smoker,  ?  ?breath sounds clear to auscultation ? ? ? ? ? ? Cardiovascular ?hypertension,  ?Rhythm:regular Rate:Normal ? ? ?  ?Neuro/Psych ? Headaches, PSYCHIATRIC DISORDERS Anxiety Depression TIA Neuromuscular disease   ? GI/Hepatic ?GERD  ,  ?Endo/Other  ?diabetes, Type 2Morbid obesity ? Renal/GU ?  ? ?  ?Musculoskeletal ? ?(+) Arthritis , Fibromyalgia - ? Abdominal ?  ?Peds ? Hematology ?  ?Anesthesia Other Findings ? ? Reproductive/Obstetrics ? ?  ? ? ? ? ? ? ? ? ? ? ? ? ? ?  ?  ? ? ? ? ? ? ? ? ?Anesthesia Physical ?Anesthesia Plan ? ?ASA: 3 ? ?Anesthesia Plan: General  ? ?Post-op Pain Management:   ? ?Induction: Intravenous ? ?PONV Risk Score and Plan: 4 or greater and Ondansetron, Dexamethasone, Treatment may vary due to age or medical condition and Midazolam ? ?Airway Management Planned: Oral ETT ? ?Additional Equipment:  ? ?Intra-op Plan:  ? ?Post-operative Plan: Extubation in OR ? ?Informed Consent: I have reviewed the patients History and Physical, chart, labs and discussed the procedure including the risks, benefits and alternatives for the proposed anesthesia with the patient or authorized representative who has indicated his/her understanding and acceptance.  ? ? ? ?Dental advisory given ? ?Plan Discussed with: CRNA, Anesthesiologist and Surgeon ? ?Anesthesia Plan Comments:   ? ? ? ? ? ? ?Anesthesia Quick Evaluation ? ?

## 2021-03-23 NOTE — Assessment & Plan Note (Signed)
-  Patient is s/p B nasolacrimal duct surgery ?-Instructions per Dr. Hollice Espy: ?Maxitrol QID ?Artificial tears QID prn ?Cold compresses prn ?Augmetin ?Elevate HOB ?No nose blowing ?-Dr. Hollice Espy is managing this issue and will see her tomorrow prior to dc ?

## 2021-03-23 NOTE — Transfer of Care (Signed)
Immediate Anesthesia Transfer of Care Note ? ?Patient: [name] ? ?Procedure(s) Performed: ENDOSCOPIC DACROCYSTORHINOSTOMY (Bilateral: Nose) ?NASAL LACRIMAL DUCT EXPLORATION (Bilateral: Nose) ?INSERTION OF CRAWFORD LACRIMAL STENT (Bilateral: Nose) ? ?Patient Location: PACU ? ?Anesthesia Type:General ? ?Level of Consciousness: awake, drowsy, patient cooperative and responds to stimulation ? ?Airway & Oxygen Therapy: Patient Spontanous Breathing and Patient connected to nasal cannula oxygen ? ?Post-op Assessment: Report given to RN and Post -op Vital signs reviewed and stable ? ?Post vital signs: Reviewed and stable ? ?Last Vitals:  ?Vitals Value Taken Time  ?BP 133/71 03/23/21 1055  ?Temp    ?Pulse 72 03/23/21 1057  ?Resp 24 03/23/21 1057  ?SpO2 100 % 03/23/21 1057  ?Vitals shown include unvalidated device data. ? ?Last Pain:  ?Vitals:  ? 03/23/21 0620  ?TempSrc:   ?PainSc: 5   ?   ? ?Patients Stated Pain Goal: 0 (03/23/21 7867) ? ?Complications: No notable events documented. ?

## 2021-03-23 NOTE — Assessment & Plan Note (Signed)
-  Continue 2L [place] O2 ?-Hold Humira while hospitalized ?

## 2021-03-23 NOTE — Assessment & Plan Note (Signed)
-  I have reviewed this patient in the [place] Controlled Substances Reporting System.  She is receiving medications from multiple providers and is not routinely on long-term narcotics (although she implied otherwise in our conversation) ?-She is at high risk of opioid misuse, diversion, or overdose. ?-Continue Topamax, Neurontin ?-Will give prn Percocet as per Dr. Hollice Espy for now ?-She will need to f/u with pain management regarding resumption of chronic medications ?-PT/OT consults tomorrow ?

## 2021-03-23 NOTE — Assessment & Plan Note (Signed)
-  Recent A1c was 5.1, indicating good control ?-Hold metformin ?-Will cover with moderate-scale SSI ?

## 2021-03-23 NOTE — Assessment & Plan Note (Signed)
-  Continue Klonopin, hydroxyzine, Effexor ?

## 2021-03-23 NOTE — Plan of Care (Signed)

## 2021-03-23 NOTE — Assessment & Plan Note (Signed)
-  Resume Xarelto on Monday night (3/14) per Dr Hollice Espy ?

## 2021-03-23 NOTE — Op Note (Signed)
Operative Note ?PATIENT NAME:  [name] ?Garrison NAME:  [id number] ?DATE OF SERVICE:  '@DATE'$ @ ?DATE OF BIRTH:  January 04, 1961 ? ?PREOPERATIVE DIAGNOSIS:   ?1. Nasolacrimal duct obstruction, bilaterally ?2. Epiphora, bilaterally ?3. Dacryocystitis, left ?4. History of DCR, right ?5. History of sarcoidosis ? ?POSTOPERATIVE DIAGNOSIS: Same ? ?PROCEDURE(S) PERFORMED:  ?1. Endoscopic dacryocystorhinostomy, bilaterally CPT [phone] ?2. Nasolacrimal duct probe with placement of Crawford stent, bilaterally [id number] ? ?SURGEON:  Delia Chimes, MD, PhD ?ASSISTANT:  none ? ?ANESTHESIA:  General ? ?FLUIDS GIVEN: Per Anesthesia  ? ?ESTIMATED BLOOD LOSS:  < 5 cc ? ?TUBES/DRAINS:  None ? ?SPECIMENS/CULTURES:  ?Nasolacrimal sac contents, left for culture ?Prior ostomy site scar tissue/lesion, right  ? ?COMPLICATIONS:  None ? ?DESCRIPTION OF PROCEDURE: After obtaining informed consent, the patient was taken back to the operating room and laid supine on the operating room table.  Under monitored anesthesia care, the operative site(s) were anesthetized with 2% lidocaine with epinephrine and bupivicaine.  The nares were packed with Afrin-soaked pledgets.  The patient was then prepped and draped in the usual sterile fashion.  ? ?Under endoscopic guidance, the lateral nasal sidewall was approached.  Anterior to the middle turbinate, incisions were made with a crescent blade.  A Freer elevator was used to lift the periosteum and nasal mucosa from the bone.  A Freer elevator was used to infracture the lacrimal bone and additional bone was removed with rongeur's.  Once an adequate osteotomy had been made, the lower punctum of the eyelid was dilated and a double lobe Bowman probe passed.  This probe was used to tent up the mucosa of the underlying nasolacrimal sac.  A crescent blade was then used to make an incision in the nasolacrimal sac.  A portion of the nasolacrimal sac was then removed using Blakesley forceps.  Purulent material  emerged from the left nasolacrimal sac and was submitted for culture. The upper and lower canaliculi were then dilated and a bicanalicular Crawford stent was placed retrieved in the nose.  A cotton tip was placed underneath the loop of the stent to protect the canaliculi.  Tension was placed on the Crawford stents and then [facility] Gelfoam was passed to the ostomy.  A 6-0 Prolene was then used to tie the stents in the nose.  A additional knot was placed in the stents themselves distal to this 6-0 Prolene suture.  The stent was allowed to retract back into the nose, the cotton tip applicator was removed from the medial canthus and the stents pulled to adequate tension.  The stents were then trimmed inside the nasal antrum.  Maxitrol ointment was placed in the eye. The procedure was then repeated on the contralateral side. Scar tissue was encountered beneath the right nasal mucosa covering the site of the prior ostomy. This was sent to pathology as a permanent specimen. ? ?The patient was then taken to the recovery room in stable condition.  ? ?Delia Chimes, MD, PhD ?Ophthalmic Plastic and Reconstructive Surgery ?[employer] ?[phone] (office) ? ?Post-operative care:  ?Maxitrol ophthalmic suspension, QID to both eyes ?Artificial tears QID PRN irritation ?Erythromycin ointment QID PRN irritation not improved by artificial tears ?Cold compresses 20 minutes maximum on, 20 min minimum off q4h PRN ?Please discharge on oral analgesics, narcotics are appropriate for this level of pain ?Oral broad spectrum antibiotics (e.g. Augmentin) for 7 days ?Keep head of bed elevated ?No nose blowing ?F/u as scheduled 3/17 at 9:30 AM ?

## 2021-03-23 NOTE — Assessment & Plan Note (Signed)
-  Body mass index is 41.04 kg/m?[name].  ?-Weight loss should be encouraged ?-Outpatient PCP/bariatric medicine f/u encouraged ?

## 2021-03-23 NOTE — Assessment & Plan Note (Signed)
-  Continue Coreg 

## 2021-03-23 NOTE — H&P (Signed)
History and Physical    Patient: [name] TWS:[id number] DOB: November 13, 1960 DOA: 03/23/2021 DOS: the patient was seen and examined on 03/23/2021 PCP: Plotnikov, Evie Lacks, MD  Patient coming from: Home - lives alone; NOK: [name], [name], [phone]   Chief Complaint: Nasolacrimal duct surgery  HPI: [name] is a 61 y.o. female with medical history significant of DM; HTN; dementia; DVT/PE; OSA not on CPAP; RA; and sarcoidosis on 2L home O2 who presented for B nasolacrimal duct obstruction, now s/p dacryocystorhinostomy.   She is currently in PACU and reports significant pain (she appeared to be resting comfortably when I approached).  She reports that she is supposed to be on chronic pain medications for back pain but had several falls in January and has been unable to get to the clinic.  She is, however, not concerned about her ability to care for herself at home post-procedure since she had a UL surgery similar to this in the past and did well.    ER Course:  Here for DCR.  Her pulmonologist recommended overnight observation.  Likely needs to hold Xarelto for 48 hours after surgery.  Needs Maxatrol drops QID, ice/compresses prn, oral abx (Augmentin), pain control (generally Percocet), Afrin prn nosebleeds.  No nos blowing, keep HOB elevated.  Doing fine on home O2.  Dr. Hollice Espy will see tomorrow AM.     Review of Systems: As mentioned in the history of present illness. All other systems reviewed and are negative. Past Medical History:  Diagnosis Date   ALLERGIC RHINITIS 10/26/2009   no current problems   B12 DEFICIENCY 08/25/2007   takes B12 supplement   Complication of anesthesia    pt has had difficulty following anesthesia with her knee in 2016-unable to care for herself afterward   Depression    takes effexor xr daily   Diabetes mellitus without complication ([place])    was on insulin but has been off since Nov 2015 and now only takes  Metformin daily   DYSPNEA 04/28/2009   uses oxygen 24/7, on 2L via [place]   Esophageal reflux    takes Nexium daily   Fibromyalgia    Headache    last migraine 2-50yr ago;takes Topamax daily   History of shingles    Hypertension    takes Coreg daily   Insomnia    takes Nortriptyline nightly    Long-term memory loss    OSA (obstructive sleep apnea)    doesn't use CPAP;sleep study in epic from 2006   Osteoarthritis    PONV (postoperative nausea and vomiting)    on time in 2016 knee surgery   [place]    Sarcoidosis    Dr. ZLolita Patella  Stroke ([facility]    Vitamin D deficiency    is supposed to take Vit D but can't afford it   Past Surgical History:  Procedure Laterality Date   APPENDECTOMY     arthroscopic knee surgery Right 11-12-04   AXILLARY ABCESS IRRIGATION AND DEBRIDEMENT  Jul & Aug2012   BIOPSY  11/02/2019   Procedure: BIOPSY;  Surgeon: PJerene Bears MD;  [facility];  Service: Gastroenterology;;   CARPAL TUNNEL RELEASE Left 05/23/2014   Procedure: CARPAL TUNNEL RELEASE;  Surgeon: SMeredith Pel MD;  [place] [place]  Service: Orthopedics;  Laterality: Left;   CHOLECYSTECTOMY N/A 02/11/2018   Procedure: LAPAROSCOPIC CHOLECYSTECTOMY WITH INTRAOPERATIVE CHOLANGIOGRAM ERAS PATHWAY;  Surgeon: TDonnie Mesa MD;  [place] [facility]  Service: General;  Laterality: N/A;  COLONOSCOPY WITH PROPOFOL N/A 11/02/2019   Procedure: COLONOSCOPY WITH PROPOFOL;  Surgeon: Jerene Bears, MD;  [facility];  Service: Gastroenterology;  Laterality: N/A;   cyst removed from top of buttocks  at age 26   ENDOMETRIAL ABLATION     IR CHOLANGIOGRAM EXISTING TUBE  11/21/2017   IR CHOLANGIOGRAM EXISTING TUBE  01/16/2018   IR EXCHANGE BILIARY DRAIN  11/10/2017   IR PATIENT EVAL TECH 0-60 MINS  02/03/2018   IR PERC CHOLECYSTOSTOMY  09/13/2017   IR RADIOLOGIST EVAL & MGMT  10/08/2017   LACRIMAL DUCT EXPLORATION Right 06/26/2017   Procedure: LACRIMAL DUCT EXPLORATION AND  ETHMOIDECTOMY;  Surgeon: Clista Bernhardt, MD;  Location: [place];  Service: Ophthalmology;  Laterality: Right;   POLYPECTOMY  11/02/2019   Procedure: POLYPECTOMY;  Surgeon: Jerene Bears, MD;  [facility];  Service: Gastroenterology;;   TEAR DUCT PROBING Right 06/26/2017   Procedure: TEAR DUCT PROBING WITH STENT;  Surgeon: Clista Bernhardt, MD;  Location: [place];  Service: Ophthalmology;  Laterality: Right;   TOTAL KNEE ARTHROPLASTY Right 11/15/2014   Procedure: TOTAL RIGHT KNEE ARTHROPLASTY;  Surgeon: Meredith Pel, MD;  Location: [place];  Service: Orthopedics;  Laterality: Right;   TOTAL KNEE ARTHROPLASTY Left 07/13/2015   Procedure: LEFT TOTAL KNEE ARTHROPLASTY;  Surgeon: Meredith Pel, MD;  Location: [place];  Service: Orthopedics;  Laterality: Left;   Social History:  reports that she quit smoking about 19 years ago. Her smoking use included cigarettes. She started smoking about 35 years ago. She has a 5.00 pack-year smoking history. She has never used smokeless tobacco. She reports that she does not currently use alcohol after a past usage of about 1.0 standard drink per week. She reports that she does not use drugs.  Allergies  Allergen Reactions   Belsomra [Suvorexant] Other (See Comments)    [place] out of bed while asleep: "I'm waking up as I'm falling on the floor;" "Night terrors"   Enalapril Maleate Cough   Latex Hives   Nickel Other (See Comments)    Blisters  Pt has a titanium right and left knee - nickel causes skin irritations that form into blisters and sores   Other Other (See Comments)    Patient has Sarcoidosis and can't tolerate any metals   Morphine And Related Itching and Nausea Only   Doxycycline Diarrhea and Nausea And Vomiting   Hydrochlorothiazide Other (See Comments)    Low potassium levels    Hydroxychloroquine Sulfate Other (See Comments)    Vision changes    Lyrica [Pregabalin] Other (See Comments)    "Made me feel high"   Tape Other (See  Comments)    Medical tape causes bruising    Family History  Problem Relation Age of Onset   Heart disease Mother    Kidney disease Mother        renal failure   Cancer Father        leukemia   Hypertension Other    Coronary artery disease Other        female 1st degree relative <60   Heart failure Other        congestive   Coronary artery disease Other        Female 1st degree relative <50   Breast cancer Other        1st degree relative <50 S   Breast cancer Sister    Colon cancer Brother 62   Stroke Brother    Heart  attack Brother    Cancer Brother 35       colon ca   Esophageal cancer Neg Hx    Stomach cancer Neg Hx    Rectal cancer Neg Hx     Prior to Admission medications   Medication Sig Start Date End Date Taking? Authorizing Provider  albuterol (VENTOLIN HFA) 108 (90 Base) MCG/ACT inhaler Inhale 2 puffs into the lungs every 6 (six) hours as needed for wheezing or shortness of breath. 01/17/21  Yes Rigoberto Noel, MD  budesonide-formoterol (SYMBICORT) 160-4.5 MCG/ACT inhaler Inhale 2 puffs into the lungs TWICE DAILY 03/12/21  Yes Parrett, Tammy S, NP  carvedilol (COREG) 12.5 MG tablet Take 1 tablet (12.5 mg total) by mouth 2 (two) times daily with a meal. 11/01/20  Yes Plotnikov, Evie Lacks, MD  Cholecalciferol (VITAMIN D-3) 125 MCG (5000 UT) TABS Take 5,000 Units by mouth daily.   Yes [provider]  clonazePAM (KLONOPIN) 0.5 MG tablet Take 1 TO 2 tablets (0.5-1 mg total) by mouth at bedtime as needed for anxiety. Patient taking differently: Take 0.25 mg by mouth at bedtime. 03/14/21  Yes Plotnikov, Evie Lacks, MD  Cyanocobalamin (VITAMIN B-12) 1000 MCG SUBL Place 1 tablet (1,000 mcg total) under the tongue daily. 11/15/19  Yes Plotnikov, Evie Lacks, MD  dexlansoprazole (DEXILANT) 60 MG capsule Take 1 capsule (60 mg total) by mouth daily. 02/09/21  Yes Plotnikov, Evie Lacks, MD  doxycycline (VIBRA-TABS) 100 MG tablet Take 100 mg by mouth 2 (two) times daily. 03/12/21   Yes [provider]  gabapentin (NEURONTIN) 300 MG capsule TAKE ONE CAPSULE BY MOUTH EVERY EVENING, MAY TAKE A SECOND CAPSULE DURING THE DAY AS NEEDED FOR PAIN Patient taking differently: Take 300 mg by mouth See admin instructions. Take '300mg'$  every evening. May take '300mg'$  during the day as needed for pain. 11/16/20  Yes Plotnikov, Evie Lacks, MD  HUMIRA PEN 40 MG/0.4ML PNKT Inject 40 mg as directed every Wednesday.  04/13/19  Yes [provider]  hydrOXYzine (ATARAX) 25 MG tablet Take 1 tablet (25 mg total) by mouth every 8 (eight) hours as needed. Patient taking differently: Take 25 mg by mouth every 8 (eight) hours as needed for itching. 03/08/21  Yes Plotnikov, Evie Lacks, MD  metFORMIN (GLUCOPHAGE-XR) 500 MG 24 hr tablet Take 2 tablets (1,000 mg total) by mouth at bedtime. 11/09/20  Yes Plotnikov, Evie Lacks, MD  Multiple Vitamins-Minerals (MULTIVITAMIN WOMEN 50+) TABS Take 1 tablet by mouth daily.   Yes [provider]  naltrexone (DEPADE) 50 MG tablet Take 12.5 mg by mouth at bedtime.  01/18/19  Yes [provider]  Omega-3 Fatty Acids (FISH OIL) 1000 MG CAPS Take 1,000 mg by mouth daily.   Yes [provider]  OVER THE COUNTER MEDICATION Makes her own sea moss gel 1 teaspoon twice daily   Yes [provider]  potassium chloride (KLOR-CON) 10 MEQ tablet Take 1 tablet (10 mEq total) by mouth daily. 08/16/20  Yes Plotnikov, Evie Lacks, MD  topiramate (TOPAMAX) 50 MG tablet Take 1 tablet (50 mg total) by mouth 2 (two) times daily. 01/29/21  Yes Plotnikov, Evie Lacks, MD  trimethoprim-polymyxin b (POLYTRIM) ophthalmic solution Place 1 drop into both eyes 4 (four) times daily. 03/09/21  Yes [provider]  venlafaxine XR (EFFEXOR-XR) 75 MG 24 hr capsule Take 1 capsule (75 mg total) by mouth daily with breakfast. 08/16/20  Yes Plotnikov, Evie Lacks, MD  B-D ULTRA-FINE 33 LANCETS MISC Use as instructed to test  sugars once  daily 05/24/19   Philemon Kingdom, MD  glucose blood (ACCU-CHEK GUIDE) test strip Use to check blood sugar once a day. 05/24/19   Philemon Kingdom, MD  neomycin-polymyxin b-dexamethasone (MAXITROL) 3.5-10000-0.1 SUSP Place 1 drop into both eyes in the morning, at noon, in the evening, and at bedtime. 08/26/20   [provider]  oxyCODONE-acetaminophen (PERCOCET) 10-325 MG tablet Take 1 tablet by mouth 4 (four) times daily as needed for pain. 08/29/20   [provider]  OXYGEN Inhale into the lungs. Takes 2 liters every day (24/7)    [provider]  XARELTO 20 MG TABS tablet Take 1 tablet (20 mg total) by mouth at bedtime. 11/16/20   Plotnikov, Evie Lacks, MD    Physical Exam: Vitals:   03/23/21 1225 03/23/21 1257 03/23/21 1310 03/23/21 1325  BP: 128/83 (!) 141/81 (!) 143/80 139/71  Pulse: 74 76 76 77  Resp: '16 17 16 19  '$ Temp:    97.9 F (36.6 C)  TempSrc:      SpO2: 97% 98% 98% 96%  Weight:      Height:       General:  Appears calm and comfortable and is in NAD Eyes:   EOMI, normal lids, mild nasolacrimal duct region erythema and crusting (upper nasal bridge) ENT:  grossly normal hearing, lips & tongue, mmm Neck:  no LAD, masses or thyromegaly Cardiovascular:  RRR, no m/r/g. No LE edema.  Respiratory:   CTA bilaterally with no wheezes/rales/rhonchi.  Normal respiratory effort. Abdomen:  soft, NT, ND Skin:  no rash or induration seen on limited exam Musculoskeletal:  no bony abnormality Psychiatric: blunted mood and affect, speech fluent and appropriate, AOx3 Neurologic:  CN 2-12 grossly intact, moves all extremities in coordinated fashion   Radiological Exams on Admission: Independently reviewed - see discussion in A/P where applicable  No results found.  EKG: not done today   Labs on Admission: I have personally reviewed the available labs and imaging studies at the time of the admission.  Pertinent labs:    Unremarkable BMP on 3/8 Normal CBC on 3/8 A1c 5.1 on  3/8 COVID negative on 3/8    Assessment and Plan: * Nasolacrimal duct obstruction, acquired, bilateral -Patient is s/p B nasolacrimal duct surgery -Instructions per Dr. Hollice Espy: Maxitrol QID Artificial tears QID prn Cold compresses prn Augmetin Elevate HOB No nose blowing -Dr. Hollice Espy is managing this issue and will see her tomorrow prior to dc  Chronic pain disorder -I have reviewed this patient in the [place] Controlled Substances Reporting System.  She is receiving medications from multiple providers and is not routinely on long-term narcotics (although she implied otherwise in our conversation) -She is at high risk of opioid misuse, diversion, or overdose. -Continue Topamax, Neurontin -Will give prn Percocet as per Dr. Hollice Espy for now -She will need to f/u with pain management regarding resumption of chronic medications -PT/OT consults tomorrow  OSA (obstructive sleep apnea) -Not on CPAP  Pulmonary embolism ([place]) -Resume Xarelto on Monday night (3/14) per Dr Hollice Espy  Controlled type 2 diabetes mellitus with chronic kidney disease, without long-term current use of insulin ([place]) -Recent A1c was 5.1, indicating good control -Hold metformin -Will cover with moderate-scale SSI  Obesity, Class III, BMI 40-49.9 (morbid obesity) ([place]) -Body mass index is 41.04 kg/m..  -Weight loss should be encouraged -Outpatient PCP/bariatric medicine f/u encouraged  Essential hypertension -Continue Coreg  Anxiety disorder -Continue Klonopin, hydroxyzine, Effexor  Sarcoidosis -Continue 2L [place] O2 -Hold Humira while  hospitalized      Advance Care Planning:   Code Status: Full Code   Consults: Oculoplastics; PT/OT/TOC team  DVT Prophylaxis: SCDs  Family Communication: None present; Dr. Hollice Espy to communicate with family post-operatively  Severity of Illness: The appropriate patient status for this patient is OBSERVATION. Observation status is judged to be reasonable and necessary  in order to provide the required intensity of service to ensure the patient's safety. The patient's presenting symptoms, physical exam findings, and initial radiographic and laboratory data in the context of their medical condition is felt to place them at decreased risk for further clinical deterioration. Furthermore, it is anticipated that the patient will be medically stable for discharge from the hospital within 2 midnights of admission.   Author: Karmen Bongo, MD 03/23/2021 1:44 PM  For on call review [email or web address].

## 2021-03-23 NOTE — Anesthesia Procedure Notes (Addendum)
Procedure Name: Intubation ?Date/Time: 03/23/2021 7:40 AM ?Performed by: Cathren Harsh, CRNA ?Pre-anesthesia Checklist: Patient identified, Emergency Drugs available, Suction available and Patient being monitored ?Patient Re-evaluated:Patient Re-evaluated prior to induction ?Oxygen Delivery Method: Circle System Utilized ?Preoxygenation: Pre-oxygenation with 100% oxygen ?Induction Type: IV induction ?Ventilation: Mask ventilation without difficulty ?Laryngoscope Size: Mac and 3 ?Grade View: Grade I ?Tube type: Oral ?Tube size: 7.0 mm ?Number of attempts: 1 ?Airway Equipment and Method: Stylet and Oral airway ?Placement Confirmation: ETT inserted through vocal cords under direct vision, positive ETCO2 and breath sounds checked- equal and bilateral ?Secured at: 20 cm ?Tube secured with: Tape ?Dental Injury: Teeth and Oropharynx as per pre-operative assessment  ? ? ? ? ?

## 2021-03-23 NOTE — H&P (Signed)
I have reviewed the H&P from Rinaldo Cloud, NP on 03/09/21. ? ?Patient remains appropriate for planned surgical procedure today. ? ?Delia Chimes, MD, PhD ?Ophthalmic Plastic and Reconstructive Surgery ?[employer] ?[phone] (office) ? ?

## 2021-03-24 DIAGNOSIS — Z96653 Presence of artificial knee joint, bilateral: Secondary | ICD-10-CM | POA: Diagnosis not present

## 2021-03-24 DIAGNOSIS — E119 Type 2 diabetes mellitus without complications: Secondary | ICD-10-CM | POA: Diagnosis not present

## 2021-03-24 DIAGNOSIS — I10 Essential (primary) hypertension: Secondary | ICD-10-CM | POA: Diagnosis not present

## 2021-03-24 DIAGNOSIS — Z8673 Personal history of transient ischemic attack (TIA), and cerebral infarction without residual deficits: Secondary | ICD-10-CM | POA: Diagnosis not present

## 2021-03-24 DIAGNOSIS — Z7984 Long term (current) use of oral hypoglycemic drugs: Secondary | ICD-10-CM | POA: Diagnosis not present

## 2021-03-24 DIAGNOSIS — G4733 Obstructive sleep apnea (adult) (pediatric): Secondary | ICD-10-CM | POA: Diagnosis not present

## 2021-03-24 DIAGNOSIS — Z91040 Latex allergy status: Secondary | ICD-10-CM | POA: Diagnosis not present

## 2021-03-24 DIAGNOSIS — Z87891 Personal history of nicotine dependence: Secondary | ICD-10-CM | POA: Diagnosis not present

## 2021-03-24 DIAGNOSIS — Z794 Long term (current) use of insulin: Secondary | ICD-10-CM | POA: Diagnosis not present

## 2021-03-24 DIAGNOSIS — Z79899 Other long term (current) drug therapy: Secondary | ICD-10-CM | POA: Diagnosis not present

## 2021-03-24 DIAGNOSIS — H04553 Acquired stenosis of bilateral nasolacrimal duct: Secondary | ICD-10-CM | POA: Diagnosis not present

## 2021-03-24 LAB — CBC
HCT: 35 % — ABNORMAL LOW (ref 36.0–46.0)
Hemoglobin: 10.9 g/dL — ABNORMAL LOW (ref 12.0–15.0)
MCH: 29.9 pg (ref 26.0–34.0)
MCHC: 31.1 g/dL (ref 30.0–36.0)
MCV: 96.2 fL (ref 80.0–100.0)
Platelets: 183 10*3/uL (ref 150–400)
RBC: 3.64 MIL/uL — ABNORMAL LOW (ref 3.87–5.11)
RDW: 15 % (ref 11.5–15.5)
WBC: 9.1 10*3/uL (ref 4.0–10.5)
nRBC: 0 % (ref 0.0–0.2)

## 2021-03-24 LAB — BASIC METABOLIC PANEL
Anion gap: 6 (ref 5–15)
BUN: 24 mg/dL — ABNORMAL HIGH (ref 6–20)
CO2: 26 mmol/L (ref 22–32)
Calcium: 8.8 mg/dL — ABNORMAL LOW (ref 8.9–10.3)
Chloride: 109 mmol/L (ref 98–111)
Creatinine, Ser: 1.06 mg/dL — ABNORMAL HIGH (ref 0.44–1.00)
GFR, Estimated: >60 mL/min (ref >60)
Glucose, Bld: 123 mg/dL — ABNORMAL HIGH (ref 70–99)
Potassium: 4.3 mmol/L (ref 3.5–5.1)
Sodium: 141 mmol/L (ref 135–145)

## 2021-03-24 LAB — GLUCOSE, CAPILLARY
Glucose-Capillary: 108 mg/dL — ABNORMAL HIGH (ref 70–99)
Glucose-Capillary: 132 mg/dL — ABNORMAL HIGH (ref 70–99)

## 2021-03-24 LAB — HIV ANTIBODY (ROUTINE TESTING W REFLEX): HIV Screen 4th Generation wRfx: NONREACTIVE

## 2021-03-24 MED ORDER — AMOXICILLIN-POT CLAVULANATE 875-125 MG PO TABS: 1.0000 | ORAL_TABLET | Freq: Two times a day (BID) | ORAL | 0 refills | Status: AC

## 2021-03-24 MED ORDER — XARELTO 20 MG PO TABS: 20.0000 mg | ORAL_TABLET | Freq: Every day | ORAL | 2 refills | Status: DC

## 2021-03-24 MED ORDER — POLYVINYL ALCOHOL 1.4 % OP SOLN: 1.0000 [drp] | OPHTHALMIC | 0 refills | Status: DC | PRN

## 2021-03-24 NOTE — Plan of Care (Signed)
Will discharge ?

## 2021-03-24 NOTE — Discharge Summary (Addendum)
Physician Discharge Summary  [name] [facility] EVO:[id number] DOB: 1960/08/31 DOA: 03/23/2021  PCP: Cassandria Anger, MD  Admit date: 03/23/2021 Discharge date: 03/24/2021  Admitted From: Home Discharge disposition: Home  Recommendations at discharge:  Recommend patient begin Flonase twice daily to both nostrils Continue Maxitrol ophthalmic suspension 4 times daily to both eyes Artificial tears 4 times daily as needed for irritation or dry eye Continue Augmentin for 7 additional days Keep head of bed elevated, ice compresses as needed, no nose blowing Pain control appears adequate at this time. Encouraged patient to use non-narcotic medicines as first line for pain control. Resume Xarelto on 3/14.  Brief narrative: [name] is a 61 y.o. female with PMH significant for morbid obesity, OSA does not use CPAP, DM2, HTN, stroke, GERD, rheumatoid arthritis, sarcoidosis, vitamin B12 deficiency, depression, fibromyalgia, frequent falls, chronic respiratory failure on 2 L oxygen at home.  Patient underwent elective endoscopic dacryocystorhinostomy by ophthalmology on 3/10 for nasolacrimal duct obstruction. Kept under observation to hospitalist service for overnight monitoring. She is reportedly on chronic opioids at home for back pain.  She had several falls recently.  Subjective: Patient was seen and examined this morning.  Pleasant elderly Caucasian female.  Sitting up in bed.  Not in distress.  No new symptoms.  Feels better and ready to go home.  Ophthalmology follow-up this morning noted. Chart reviewed Hemodynamically stable, on 2 L at baseline  Principal Problem:   Nasolacrimal duct obstruction, acquired, bilateral Active Problems:   Sarcoidosis   Anxiety disorder   Essential hypertension   Obesity, Class III, BMI 40-49.9 (morbid obesity) ([place])   Controlled type 2 diabetes mellitus with chronic kidney disease, without long-term current use of insulin  (HCC)   Pulmonary embolism (HCC)   OSA (obstructive sleep apnea)   Chronic pain disorder    Assessment and Plan: Nasolacrimal duct obstruction, acquired, bilateral -Patient is s/p B nasolacrimal duct surgery -Instructions per Dr. Hollice Espy: Recommendations at discharge:  Recommend patient begin Flonase twice daily to both nostrils Continue Maxitrol ophthalmic suspension 4 times daily to both eyes Artificial tears 4 times daily as needed for irritation or dry eye Continue Augmentin for 7 additional days Keep head of bed elevated, ice compresses as needed, no nose blowing Pain control appears adequate at this time. Encouraged patient to use non-narcotic medicines as first line for pain control.  Chronic pain disorder -[place] Controlled Substances Reporting System was reviewed.  She is receiving medications from multiple providers and is not routinely on long-term narcotics (although she implied otherwise in our conversation) -She is at high risk of opioid misuse, diversion, or overdose. -Continue Topamax, Neurontin -She will need to f/u with pain management regarding resumption of chronic medications -PT eval obtained.  No follow-up recommended.  History of pulmonary embolism ([place]) -Resume Xarelto on Monday night (3/14) per Dr Hollice Espy  Morbid obesity  -Body mass index is 41.04 kg/m. Patient has been advised to make an attempt to improve diet and exercise patterns to aid in weight loss.  OSA (obstructive sleep apnea) -Does not use CPAP  Controlled type 2 diabetes mellitus with chronic kidney disease, without long-term current use of insulin (HCC) -Recent A1c was 5.1, indicating good control -To resume metformin at home.  Essential hypertension -Continue Coreg  Anxiety disorder -Continue Klonopin, hydroxyzine, Effexor  Sarcoidosis Chronic respiratory failure with hypoxia -Continue 2L  O2 -Continue Humira as an outpatient.  Wounds:  - Incision - 4 Ports Abdomen 1:  Right;Lateral 2: Mid;Upper 3: Left;Lateral;Lower  4: Umbilicus (Active)  Placement Date/Time: 02/11/18 0902   Location of Ports: Abdomen  Port: 1:  Location Orientation: Right;Lateral  Port: 2:  Location Orientation: Mid;Upper  Port: 3:  Location Orientation: Left;Lateral;Lower  Port: 4:  Location Orientation: Umbilicus    Assessments 02/11/2018  9:50 AM 02/12/2018 10:48 AM  Port 1 Site Assessment Clean;Dry Clean;Dry  Port 1 Drainage Amount None None  Port 1 Dressing Type Gauze (Comment) Gauze (Comment)  Port 1 Dressing Status Clean;Dry;Intact Clean;Dry;Intact  Port 2 Site Assessment Clean;Dry Clean;Dry  Port 2 Drainage Amount None None  Port 2 Dressing Type Gauze (Comment) Gauze (Comment)  Port 2 Dressing Status Clean;Dry;Intact Clean;Dry;Intact  Port 3 Site Assessment Clean;Dry Clean;Dry  Port 3 Drainage Amount None None  Port 3 Dressing Type Gauze (Comment) Gauze (Comment)  Port 3 Dressing Status Clean;Dry;Intact Clean;Dry;Intact  Port 4 Site Assessment Clean;Dry Clean;Dry  Port 4 Drainage Amount None None  Port 4 Dressing Type Gauze (Comment) Gauze (Comment)  Port 4 Dressing Status Clean;Dry;Intact Clean;Dry;Intact     No Linked orders to display     Incision (Closed) 02/11/18 Abdomen Other (Comment) (Active)  Date First Assessed/Time First Assessed: 02/11/18 0908   Location: Abdomen  Location Orientation: Other (Comment)    Assessments 02/11/2018  9:50 AM 02/12/2018 10:48 AM  Dressing Type Adhesive strips;Gauze (Comment) Adhesive strips;Gauze (Comment)  Dressing Clean;Dry;Intact Clean;Dry;Intact  Site / Wound Assessment Dressing in place / Unable to assess Dressing in place / Unable to assess  Drainage Amount None None     No Linked orders to display     Incision (Closed) 03/23/21 Eye Left (Active)  Date First Assessed/Time First Assessed: 03/23/21 2010   Location: Eye  Location Orientation: Left  Present on Admission: No    Assessments 03/23/2021  8:15 PM 03/24/2021  9:00 AM   Dressing Type None None  Site / Wound Assessment Clean;Dry;Painful Clean;Dry  Margins Attached edges (approximated) Attached edges (approximated)  Drainage Amount None None     No Linked orders to display    Discharge Exam:   Vitals:   03/23/21 2029 03/24/21 0438 03/24/21 0808 03/24/21 0811  BP: (!) 126/55 125/73  (!) 157/73  Pulse: 80 66  66  Resp: _0 Temp: 98.8 F (37.1 C) 97.9 F (36.6 C)  97.9 F (36.6 C)  TempSrc: Oral Oral    SpO2: 95% 99% 100% 100%  Weight:      Height:        Body mass index is 41.04 kg/m.  General exam: Pleasant, middle-aged Caucasian female.  Looks older for age. Skin: No rashes, lesions or ulcers. HEENT: Eye examination per ophthalmology Lungs: Clear to auscultate bilaterally CVS: Regular rate and rhythm, no murmur GI/Abd soft, nontender, nondistended, bowel sound present CNS: Alert, awake, oriented x3 Psychiatry: Mood appropriate Extremities: No pedal edema, no calf tenderness  Follow ups:    Follow-up [facility].   Contact information: [address] [place] [address] [phone]                Discharge Instructions:   Discharge Instructions     Call MD for:  difficulty breathing, headache or visual disturbances   Complete by: As directed    Call MD for:  extreme fatigue   Complete by: As directed    Call MD for:  hives   Complete by: As directed    Call MD for:  persistant dizziness  or light-headedness   Complete by: As directed    Call MD for:  persistant nausea and vomiting   Complete by: As directed    Call MD for:  severe uncontrolled pain   Complete by: As directed    Call MD for:  temperature >100.4   Complete by: As directed    Diet general   Complete by: As directed    Discharge instructions   Complete by: As directed    Recommendations at discharge:  Recommend patient begin Flonase twice daily to both  nostrils Continue Maxitrol ophthalmic suspension 4 times daily to both eyes Artificial tears 4 times daily as needed for irritation or dry eye Continue Augmentin for 7 additional days Keep head of bed elevated, ice compresses as needed, no nose blowing Pain control appears adequate at this time, suitable for discharge on a short course of oral analgesics. Encouraged patient to use non-narcotic medicines as first line for pain control. Resume Xarelto on 3/14.   Discharge wound care:   Complete by: As directed    Increase activity slowly   Complete by: As directed        Discharge Medications:   Allergies as of 03/24/2021       Reactions   Belsomra [suvorexant] Other (See Comments)   [place] out of bed while asleep: "I'm waking up as I'm falling on the floor;" "Night terrors"   Enalapril Maleate Cough   Latex Hives   Nickel Other (See Comments)   Blisters  Pt has a titanium right and left knee - nickel causes skin irritations that form into blisters and sores   Other Other (See Comments)   Patient has Sarcoidosis and can't tolerate any metals   Morphine And Related Itching, Nausea Only   Doxycycline Diarrhea, Nausea And Vomiting   Hydrochlorothiazide Other (See Comments)   Low potassium levels    Hydroxychloroquine Sulfate Other (See Comments)   Vision changes    Lyrica [pregabalin] Other (See Comments)   "Made me feel high"   Tape Other (See Comments)   Medical tape causes bruising        Medication List     STOP taking these medications    doxycycline 100 MG tablet Commonly known as: VIBRA-TABS       TAKE these medications    Accu-Chek Guide test strip Generic drug: glucose blood Use to check blood sugar once a day.   albuterol 108 (90 Base) MCG/ACT inhaler Commonly known as: VENTOLIN HFA Inhale 2 puffs into the lungs every 6 (six) hours as needed for wheezing or shortness of breath.   amoxicillin-clavulanate 875-125 MG tablet Commonly known as:  AUGMENTIN Take 1 tablet by mouth 2 (two) times daily for 7 days.   B-D ULTRA-FINE 33 LANCETS Misc Use as instructed to test sugars once  daily   budesonide-formoterol 160-4.5 MCG/ACT inhaler Commonly known as: Symbicort Inhale 2 puffs into the lungs TWICE DAILY What changed:  how much to take when to take this additional instructions   carvedilol 12.5 MG tablet Commonly known as: COREG Take 1 tablet (12.5 mg total) by mouth 2 (two) times daily with a meal.   clonazePAM 0.5 MG tablet Commonly known as: KLONOPIN Take 1 TO 2 tablets (0.5-1 mg total) by mouth at bedtime as needed for anxiety. What changed: See the new instructions.   dexlansoprazole 60 MG capsule Commonly known as: DEXILANT Take 1 capsule (60 mg total) by mouth daily. What changed:  how much to take how to take  this when to take this additional instructions   Fish Oil 1000 MG Caps Take 1,000 mg by mouth daily.   gabapentin 300 MG capsule Commonly known as: NEURONTIN TAKE ONE CAPSULE BY MOUTH EVERY EVENING, MAY TAKE A SECOND CAPSULE DURING THE DAY AS NEEDED FOR PAIN What changed: See the new instructions.   Humira Pen 40 MG/0.4ML Pnkt Generic drug: Adalimumab Inject 40 mg as directed every Wednesday.   hydrOXYzine 25 MG tablet Commonly known as: ATARAX Take 1 tablet (25 mg total) by mouth every 8 (eight) hours as needed. What changed:  how much to take when to take this additional instructions   metFORMIN 500 MG 24 hr tablet Commonly known as: GLUCOPHAGE-XR Take 2 tablets (1,000 mg total) by mouth at bedtime.   Multivitamin Women 50+ Tabs Take 1 tablet by mouth daily.   naloxone 4 MG/0.1ML Liqd nasal spray kit Commonly known as: NARCAN Place 1 spray into the nose once as needed (overdose).   naltrexone 50 MG tablet Commonly known as: DEPADE Take 12.5 mg by mouth at bedtime.   neomycin-polymyxin b-dexamethasone 3.5-10000-0.1 Susp Commonly known as: MAXITROL Place 1 drop into both eyes in  the morning, at noon, in the evening, and at bedtime.   OVER THE COUNTER MEDICATION Take 10 mLs by mouth 2 (two) times daily. Makes her own sea moss gel   OXYGEN Inhale 2 L into the lungs continuous.   polyvinyl alcohol 1.4 % ophthalmic solution Commonly known as: LIQUIFILM TEARS Place 1 drop into both eyes as needed for dry eyes.   potassium chloride 10 MEQ tablet Commonly known as: KLOR-CON Take 1 tablet (10 mEq total) by mouth daily.   topiramate 50 MG tablet Commonly known as: TOPAMAX Take 1 tablet (50 mg total) by mouth 2 (two) times daily.   venlafaxine XR 75 MG 24 hr capsule Commonly known as: EFFEXOR-XR Take 1 capsule (75 mg total) by mouth daily with breakfast.   Vitamin B-12 1000 MCG Subl Place 1 tablet (1,000 mcg total) under the tongue daily.   Vitamin D-3 125 MCG (5000 UT) Tabs Take 5,000 Units by mouth daily.   Xarelto 20 MG Tabs tablet Generic drug: rivaroxaban Take 1 tablet (20 mg total) by mouth at bedtime. Start taking on: March 27, 2021 What changed:  how much to take These instructions start on March 27, 2021. If you are unsure what to do until then, ask your doctor or other care provider.               Discharge Care Instructions  (From admission, onward)           Start     Ordered   03/24/21 0000  Discharge wound care:        03/24/21 1339             The results of significant diagnostics from this hospitalization (including imaging, microbiology, ancillary and laboratory) are listed below for reference.    Procedures and Diagnostic Studies:   No results found.   Labs:   Basic Metabolic Panel: Recent Labs  Lab 03/21/21 1446 03/24/21 0222  NA 139 141  K 4.1 4.3  CL 105 109  CO2 25 26  GLUCOSE 108* 123*  BUN 23* 24*  CREATININE 0.90 1.06*  CALCIUM 9.2 8.8*   GFR Estimated Creatinine Clearance: 75.3 mL/min (A) (by C-G formula based on SCr of 1.06 mg/dL (H)). Liver Function Tests: No results for input(s):  AST, ALT, ALKPHOS, BILITOT, PROT, ALBUMIN in the  last 168 hours. No results for input(s): LIPASE, AMYLASE in the last 168 hours. No results for input(s): AMMONIA in the last 168 hours. Coagulation profile No results for input(s): INR, PROTIME in the last 168 hours.  CBC: Recent Labs  Lab 03/21/21 1446 03/24/21 0222  WBC 7.7 9.1  HGB 12.6 10.9*  HCT 40.6 35.0*  MCV 96.4 96.2  PLT 215 183   Cardiac Enzymes: No results for input(s): CKTOTAL, CKMB, CKMBINDEX, TROPONINI in the last 168 hours. BNP: Invalid input(s): POCBNP CBG: Recent Labs  Lab 03/23/21 0551 03/23/21 1100 03/23/21 2216 03/24/21 0812 03/24/21 1245  GLUCAP 121* 149* 129* 108* 132*   D-Dimer No results for input(s): DDIMER in the last 72 hours. Hgb A1c No results for input(s): HGBA1C in the last 72 hours.  Lipid Profile No results for input(s): CHOL, HDL, LDLCALC, TRIG, CHOLHDL, LDLDIRECT in the last 72 hours. Thyroid function studies No results for input(s): TSH, T4TOTAL, T3FREE, THYROIDAB in the last 72 hours.  Invalid input(s): FREET3 Anemia work up No results for input(s): VITAMINB12, FOLATE, FERRITIN, TIBC, IRON, RETICCTPCT in the last 72 hours. Microbiology Recent Results (from the past 240 hour(s))  SARS CORONAVIRUS 2 (TAT 6-24 HRS) Nasopharyngeal Nasopharyngeal Swab     Status: None   Collection Time: 03/21/21  2:24 PM   Specimen: Nasopharyngeal Swab  Result Value Ref Range Status   SARS Coronavirus 2 NEGATIVE NEGATIVE Final    Comment: (NOTE) SARS-CoV-2 target nucleic acids are NOT DETECTED.  The SARS-CoV-2 RNA is generally detectable in upper and lower respiratory specimens during the acute phase of infection. Negative results do not preclude SARS-CoV-2 infection, do not rule out co-infections with other pathogens, and should not be used as the sole basis for treatment or other patient management decisions. Negative results must be combined with clinical observations, patient history, and  epidemiological information. The expected result is Negative.  Fact Sheet for Patients: SugarRoll.be  Fact Sheet for Healthcare Providers: https://www.woods-mathews.com/  This test is not yet approved or cleared by the [place] FDA and  has been authorized for detection and/or diagnosis of SARS-CoV-2 by FDA under an Emergency Use Authorization (EUA). This EUA will remain  in effect (meaning this test can be used) for the duration of the COVID-19 declaration under Se ction 564(b)(1) of the Act, 21 U.S.C. section 360bbb-3(b)(1), unless the authorization is terminated or revoked sooner.  Performed at [facility], [place] [address] [place], [place] [address]   Aerobic/Anaerobic Culture w Gram Stain (surgical/deep wound)     Status: None (Preliminary result)   Collection Time: 03/23/21  9:07 AM   Specimen: PATH Sinus Contents/Nasal Polyps; Tissue  Result Value Ref Range Status   Specimen Description WOUND  Final   Special Requests LEFT NASOLACRIMO [place] SPEC A  Final   Gram Stain   Final    RARE WBC PRESENT, PREDOMINANTLY MONONUCLEAR RARE GRAM POSITIVE COCCI    Culture   Final    FEW ENTEROBACTER AEROGENES SUSCEPTIBILITIES TO FOLLOW Performed at [facility], [place] [address] [place], [place] [address]    Report Status PENDING  Incomplete    Time coordinating discharge: 35 minutes  Signed: Mazzy Santarelli  [facility] 03/24/2021, 3:13 PM

## 2021-03-24 NOTE — Progress Notes (Signed)
OPHTHALMOLOGY Consult Follow Up ? ?[name] ?30-Jun-1960 ?03/24/2021  ? ?F/U evaluation of:  POD#1 s/p bilateral endoscopic DCR ? ?Subjective:   ?Denies pain currently, used 1x 5 mg oxycodone overnight but no NSAID or acetaminophen ?Admits some light sensitivity ?Minimal foreign body sensation to right eye in the nasal corner ?Doing well overall ? ?Data:   ?Mood and Affect Normal:  Yes ?Mental Status - Oriented to person, place, and time:  Yes ? ?Pen light exam: ? OD OS  ?Adnexa WNL Erythema over nasolacrimal sac  ?Cornea WNL WNL  ?    ?    ?    ?    ? ?Pupil Dilation:  not done ? ?Labs/Images:   ?Preliminary cultures from left nasolacrimal sac showing rare GPC's and mononuclear infiltrate. ? ?A/P: ? ?POD#1 DCR ?Doing well overall from a postoperative standpoint ?Abundant scar/fibrous tissue encountered submucosally on the right at site of previous DCR-awaiting pathology results ?Recommend patient begin Flonase twice daily to both nostrils ?Continue Maxitrol ophthalmic suspension 4 times daily to both eyes ?Artificial tears 4 times daily as needed for irritation or dry eye ?Continue Augmentin for 7 additional days ?Keep head of bed elevated, ice compresses as needed, no nose blowing ?Pain control appears adequate at this time, suitable for discharge on a short course of oral analgesics. Encouraged patient to use non-narcotic medicines as first line for pain control. ? ?Nasal septal perforation ?Incidentally noted intraoperatively. Patient follows with Dr. Redmond Baseman (ENT), last in 2020 for ear issues where a CT orbits was recommended but not performed for facial pain.  This was present on my review of her 2019 CT performed for R dacryocystitis and on Dr. Gloriann Loan initial exam in 2020. A repeat CT in 2022 performed for L dacryocystitis noted loss of nasal arch bone which was attributed to DCR, which I find unusual but these images are not available for review. Patient denies intranasal drugs of abuse.  Ddx is  likely inflammatory (pt does have history of sarcoidosis and lichen planus). Recommend outpatient follow-up with Dr. Redmond Baseman given patient's complaint of continued sinus congestion and to determine if any further workup is necessary for her septal perforation.  ? ? ?Delia Chimes  03/24/2021, 7:49 AM  ?

## 2021-03-24 NOTE — Evaluation (Addendum)
Occupational Therapy Evaluation and discharge ?Patient Details ?Name: [name] ?MRN: [id number] ?DOB: Dec 21, 1960 ?Today's Date: 03/24/2021 ? ? ?History of Present Illness 61 y.o. female with medical history significant of DM; HTN; dementia; DVT/PE; OSA not on CPAP; RA; and sarcoidosis on 2L home O2 who presented for B nasolacrimal duct obstruction, now s/p dacryocystorhinostomy  ? ?Clinical Impression ?  ?Pt pleasant and agreeable to session, endorsing at home having PCA 5 days/week to assist as needed with ADLs, but primarily assisting pt with IADL tasks. Pt at baseline generally mod indep, no recent falls. Support from PCA and family available. Currently presenting to be at or near functional baseline of mod indep with use of RW for in room mobility and self care transfers as well as for completion of seated and standing ADLs. After review of precautions, pt present with no further need for acute or post acute skilled OT services, with OT to sign off at this time with recommendations listed below.   ?   ? ?Recommendations for follow up therapy are one component of a multi-disciplinary discharge planning process, led by the attending physician.  Recommendations may be updated based on patient status, additional functional criteria and insurance authorization.  ? ?Follow Up Recommendations ? No OT follow up  ?  ?Assistance Recommended at Discharge Set up Supervision/Assistance with initial transition to home.   ?Patient can return home with the following A little help with bathing/dressing/bathroom;Assistance with cooking/housework;Assist for transportation ? ?  ?Functional Status Assessment ? Patient has not had a recent decline in their functional status  ?Equipment Recommendations ? None recommended by OT (pt with all appropriate equip at home)  ?  ?Recommendations for Other Services   ? ? ?  ?Precautions / Restrictions Precautions ?Precautions: Other (comment) ?Precaution Comments: Do not blow  nose, keep head elevated, ice 20 min on/off throughout day. no lifting ?Restrictions ?Weight Bearing Restrictions: No  ? ?  ? ?Mobility Bed Mobility ?Overal bed mobility: Modified Independent ?  ?  ?  ?  ?  ?  ?General bed mobility comments: HOB elevated, increased time ?  ? ?Transfers ?Overall transfer level: Modified independent ?Equipment used: None, Rolling walker (2 wheels) ?  ?  ?  ?  ?  ?  ?  ?General transfer comment: good awareness of safety and limitations. ?  ? ?  ?Balance Overall balance assessment: No apparent balance deficits (not formally assessed) ?  ?  ?  ?  ?  ?  ?  ?  ?  ?  ?  ?  ?  ?  ?  ?  ?  ?  ?   ? ?ADL either performed or assessed with clinical judgement  ? ?ADL Overall ADL's : At baseline ?  ?  ?  ?  ?  ?  ?  ?  ?  ?  ?  ?  ?  ?  ?  ?  ?  ?  ?  ?General ADL Comments: pt demo's need for intermittent A for IADLs limited by balance which appears to be baseline.  ? ? ? ?Vision Baseline Vision/History: 0 No visual deficits ?Vision Assessment?: Yes  ?   ?Perception   ?  ?Praxis   ?  ? ?Pertinent Vitals/Pain Pain Assessment ?Pain Assessment: 0-10 ?Pain Score: 3  ?Pain Location: "general pain all over I have everyday" ?Pain Descriptors / Indicators: Constant ?Pain Intervention(s): Limited activity within patient's tolerance, Repositioned  ? ? ? ?Hand Dominance   ?  ?  Extremity/Trunk Assessment Upper Extremity Assessment ?Upper Extremity Assessment: Overall WFL for tasks assessed ?  ?Lower Extremity Assessment ?Lower Extremity Assessment: Defer to PT evaluation ?  ?Cervical / Trunk Assessment ?Cervical / Trunk Assessment: Normal ?  ?Communication Communication ?Communication: No difficulties ?  ?Cognition Arousal/Alertness: Awake/alert ?Behavior During Therapy: [facility] for tasks assessed/performed ?Overall Cognitive Status: Within Functional Limits for tasks assessed ?  ?  ?  ?  ?  ?  ?  ?  ?  ?  ?  ?  ?  ?  ?  ?  ?  ?  ?  ?General Comments  mild brusiing to L orbit, review of precautions and  positioning ? ?  ?Exercises   ?  ?Shoulder Instructions    ? ? ?Home Living Family/patient expects to be discharged to:: Private residence ?Living Arrangements: Alone ?Available Help at Discharge: Family;Available PRN/intermittently;Personal care attendant (sister, son and aide 5x/week) ?Type of Home: Apartment ?Home Access: Level entry ?  ?  ?Home Layout: One level ?  ?  ?Bathroom Shower/Tub: Tub/shower unit ?  ?Bathroom Toilet: Handicapped height ?Bathroom Accessibility: Yes ?  ?Home Equipment: Conservation officer, nature (2 wheels);Cane - quad;Shower seat;Grab bars - toilet;Grab bars - tub/shower ?  ?Additional Comments: pt with PCA 5 days/week to assist wtih ADLs/IADLs as needed. family support available ?  ? ?  ?Prior Functioning/Environment Prior Level of Function : Independent/Modified Independent ?  ?  ?  ?  ?  ?  ?Mobility Comments: rollator in home ?ADLs Comments: A for IADLs of laundry, cleaning and transportation. pt was mod indep with use of DME/long handled tools with ?  ? ?  ?  ?OT Problem List:   ?  ?   ?OT Treatment/Interventions:    ?  ?OT Goals(Current goals can be found in the care plan section) Acute Rehab OT Goals ?Patient Stated Goal: to go home soon  ?OT Frequency:   ?  ? ?Co-evaluation   ?  ?  ?  ?  ? ?  ?AM-PAC OT "6 Clicks" Daily Activity     ?Outcome Measure Help from another person eating meals?: None ?Help from another person taking care of personal grooming?: None ?Help from another person toileting, which includes using toliet, bedpan, or urinal?: None ?Help from another person bathing (including washing, rinsing, drying)?: A Little ?Help from another person to put on and taking off regular upper body clothing?: None ?Help from another person to put on and taking off regular lower body clothing?: A Little ?6 Click Score: 22 ?  ?End of Session Equipment Utilized During Treatment: Rolling walker (2 wheels) ? ?Activity Tolerance:   ?Patient left:   ? ?OT Visit Diagnosis: Unsteadiness on feet (R26.81)   ?              ?Time: 0258-5277 ?OT Time Calculation (min): 17 min ?Charges:  OT General Charges ?$OT Visit: 1 Visit ?OT Evaluation ?$OT Eval Low Complexity: 1 Low ? ?[name] OTR/L ?acute rehab services ?Office: [phone] ? ?[name] ?03/24/2021, 10:15 AM ?

## 2021-03-24 NOTE — Evaluation (Signed)
Physical Therapy Evaluation and Discharge ?Patient Details ?Name: [name] [facility] ?MRN: [id number] ?DOB: 1960/05/20 ?Today's Date: 03/24/2021 ? ?History of Present Illness ? 61 y.o. female with medical history significant of DM; HTN; dementia; DVT/PE; OSA not on CPAP; RA; and sarcoidosis on 2L home O2 who presented for B nasolacrimal duct obstruction, now s/p dacryocystorhinostomy  ?Clinical Impression ? Patient evaluated by Physical Therapy with no further acute PT needs identified. All education has been completed and the patient has no further questions. Patient at baseline, utilized RW/Rollator at home, on 2L supplemental O2 - currently 96% while ambulating today on 2L. No overt LOB. Pt reports falling in January but none since - is followed by Ortho and may have hip injection. Verbalizes understanding of precautions. Lives alone in handicap accessible apartment, sister lives near by and will provide assist if needed including bringing food per pt report. She feels at her baseline regarding mobility. See below for any follow-up Physical Therapy or equipment needs. PT is signing off. Thank you for this referral. ?   ?   ? ?Recommendations for follow up therapy are one component of a multi-disciplinary discharge planning process, led by the attending physician.  Recommendations may be updated based on patient status, additional functional criteria and insurance authorization. ? ?Follow Up Recommendations No PT follow up ? ?  ?Assistance Recommended at Discharge None  ?Patient can return home with the following ? Assist for transportation ? ?  ?Equipment Recommendations None recommended by PT  ?Recommendations for Other Services ?    ?  ?Functional Status Assessment Patient has not had a recent decline in their functional status  ? ?  ?Precautions / Restrictions Precautions ?Precautions: Other (comment) ?Precaution Comments: Do not blow nose, keep head elevated ?Restrictions ?Weight Bearing Restrictions: No   ? ?  ? ?Mobility ? Bed Mobility ?Overal bed mobility: Modified Independent ?  ?  ?  ?  ?  ?  ?General bed mobility comments: extra time ?  ? ?Transfers ?Overall transfer level: Modified independent ?Equipment used: None, Rolling walker (2 wheels) ?  ?  ?  ?  ?  ?  ?  ?General transfer comment: Performed safely no assistance needed ?  ? ?Ambulation/Gait ?Ambulation/Gait assistance: Modified independent (Device/Increase time) ?Gait Distance (Feet): 250 Feet ?Assistive device: Rolling walker (2 wheels) ?Gait Pattern/deviations: Step-through pattern, Wide base of support ?Gait velocity: decreased ?  ?  ?General Gait Details: slightly decreased gait speed. Steady with device. using appropriately. No overt instability noted. ? ?Stairs ?  ?  ?  ?  ?  ? ?Wheelchair Mobility ?  ? ?Modified Rankin (Stroke Patients Only) ?  ? ?  ? ?Balance Overall balance assessment: No apparent balance deficits (not formally assessed) ?  ?  ?  ?  ?  ?  ?  ?  ?  ?  ?  ?  ?  ?  ?  ?  ?  ?  ?   ? ? ? ?Pertinent Vitals/Pain Pain Assessment ?Pain Assessment: 0-10 ?Pain Score: 3  ?Pain Location: "general pain all over I have everyday" ?Pain Descriptors / Indicators: Constant ?Pain Intervention(s): Limited activity within patient's tolerance  ? ? ?Home Living Family/patient expects to be discharged to:: Private residence ?Living Arrangements: Alone ?Available Help at Discharge: Family;Available PRN/intermittently (sister) ?Type of Home: Apartment ?Home Access: Level entry ?  ?  ?  ?Home Layout: One level ?Home Equipment: Conservation officer, nature (2 wheels);Cane - quad;Rollator (4 wheels) ?   ?  ?Prior Function  Prior Level of Function : Independent/Modified Independent ?  ?  ?  ?  ?  ?  ?  ?  ?  ? ? ?Hand Dominance  ?   ? ?  ?Extremity/Trunk Assessment  ? Upper Extremity Assessment ?Upper Extremity Assessment: Defer to OT evaluation ?  ? ?Lower Extremity Assessment ?Lower Extremity Assessment: Generalized weakness ?  ? ?   ?Communication  ? Communication: No  difficulties  ?Cognition Arousal/Alertness: Awake/alert ?Behavior During Therapy: [facility] for tasks assessed/performed ?Overall Cognitive Status: Within Functional Limits for tasks assessed ?  ?  ?  ?  ?  ?  ?  ?  ?  ?  ?  ?  ?  ?  ?  ?  ?  ?  ?  ? ?  ?General Comments General comments (skin integrity, edema, etc.): Reviewed precautions. Pt reports falling in January, followed by orthopedist who she states believes etiology is from the right hip. She is going to have injections. ? ?  ?Exercises    ? ?Assessment/Plan  ?  ?PT Assessment Patient does not need any further PT services  ?PT Problem List   ? ?   ?  ?PT Treatment Interventions     ? ?PT Goals (Current goals can be found in the Care Plan section)  ?Acute Rehab PT Goals ?Patient Stated Goal: Go home ?PT Goal Formulation: All assessment and education complete, DC therapy ? ?  ?Frequency   ?  ? ? ?Co-evaluation   ?  ?  ?  ?  ? ? ?  ?AM-PAC PT "6 Clicks" Mobility  ?Outcome Measure Help needed turning from your back to your side while in a flat bed without using bedrails?: None ?Help needed moving from lying on your back to sitting on the side of a flat bed without using bedrails?: None ?Help needed moving to and from a bed to a chair (including a wheelchair)?: None ?Help needed standing up from a chair using your arms (e.g., wheelchair or bedside chair)?: None ?Help needed to walk in hospital room?: None ?Help needed climbing 3-5 steps with a railing? : A Little ?6 Click Score: 23 ? ?  ?End of Session Equipment Utilized During Treatment: Gait belt ?Activity Tolerance: Patient tolerated treatment well ?Patient left: in bed;with call bell/phone within reach;with bed alarm set;with SCD's reapplied (HOB elevated) ?  ?PT Visit Diagnosis: Unsteadiness on feet (R26.81);Pain ?Pain - part of body:  ("all over" baseline) ?  ? ?Time: 9741-6384 ?PT Time Calculation (min) (ACUTE ONLY): 22 min ? ? ?Charges:   PT Evaluation ?$PT Eval Low Complexity: 1 Low ?  ?  ?   ? ? ?Elayne Snare, PT, DPT ? ?Ellouise Newer ?03/24/2021, 10:10 AM ? ?

## 2021-03-26 ENCOUNTER — Encounter (HOSPITAL_COMMUNITY): Payer: Self-pay | Admitting: Optometry

## 2021-03-26 LAB — GLUCOSE, CAPILLARY: Glucose-Capillary: 178 mg/dL — ABNORMAL HIGH (ref 70–99)

## 2021-03-26 LAB — SURGICAL PATHOLOGY

## 2021-03-28 LAB — AEROBIC/ANAEROBIC CULTURE W GRAM STAIN (SURGICAL/DEEP WOUND)

## 2021-03-28 NOTE — Anesthesia Postprocedure Evaluation (Signed)
Anesthesia Post Note ? ?Patient: Patriciaann [facility] ? ?Procedure(s) Performed: ENDOSCOPIC DACROCYSTORHINOSTOMY (Bilateral: Nose) ?NASAL LACRIMAL DUCT EXPLORATION (Bilateral: Nose) ?INSERTION OF CRAWFORD LACRIMAL STENT (Bilateral: Nose) ? ?  ? ?Patient location during evaluation: PACU ?Anesthesia Type: General ?Level of consciousness: awake and alert ?Pain management: pain level controlled ?Vital Signs Assessment: post-procedure vital signs reviewed and stable ?Respiratory status: spontaneous breathing, nonlabored ventilation, respiratory function stable and patient connected to nasal cannula oxygen ?Cardiovascular status: blood pressure returned to baseline and stable ?Postop Assessment: no apparent nausea or vomiting ?Anesthetic complications: no ? ? ?No notable events documented. ? ?Last Vitals:  ?Vitals:  ? 03/24/21 0808 03/24/21 0811  ?BP:  (!) 157/73  ?Pulse:  66  ?Resp:  18  ?Temp:  36.6 ?C  ?SpO2: 100% 100%  ?  ?Last Pain:  ?Vitals:  ? 03/24/21 0900  ?TempSrc:   ?PainSc: 0-No pain  ? ? ?  ?  ?  ?  ?  ?  ? ?Hargill S ? ? ? ? ?

## 2021-04-06 DIAGNOSIS — J449 Chronic obstructive pulmonary disease, unspecified: Secondary | ICD-10-CM | POA: Diagnosis not present

## 2021-04-06 NOTE — Unmapped (Signed)
[facility] Refill Coordination Note    **Patient missed 03/28/21 injectino due to hospital stay. Resumed Humira injections on 04/04/21**    Specialty Medication(s) to be Shipped:   Inflammatory Disorders: Humira    Other medication(s) to be shipped: No additional medications requested for fill at this time     Carolyn Newton, DOB: 03/23/1960  Phone: [phone] (home)       All above HIPAA information was verified with patient.     Was a Nurse, learning disability used for this call? No    Completed refill call assessment today to schedule patient's medication shipment from the [facility] [phone]).  All relevant notes have been reviewed.     Specialty medication(s) and dose(s) confirmed: Regimen is correct and unchanged.   Changes to medications: Carolyn Newton reports no changes at this time.  Changes to insurance: No  New side effects reported not previously addressed with a pharmacist or physician: None reported  Questions for the pharmacist: No    Confirmed patient received a Conservation officer, historic buildings and a Surveyor, mining with first shipment. The patient will receive a drug information handout for each medication shipped and additional FDA Medication Guides as required.       DISEASE/MEDICATION-SPECIFIC INFORMATION        N/A    SPECIALTY MEDICATION ADHERENCE     Medication Adherence    Patient reported X missed doses in the last month: 0  Specialty Medication: Humira  Patient is on additional specialty medications: No  Patient is on more than two specialty medications: No  Any gaps in refill history greater than 2 weeks in the last 3 months: no  Demonstrates understanding of importance of adherence: yes  Informant: patient  Reliability of informant: reliable  Refills needed for supportive medications: not needed              Were doses missed due to medication being on hold? No    HUMIRA(CF) PEN 40 mg/0.4 mL : 14 days of medicine on hand        REFERRAL TO PHARMACIST     Referral to the pharmacist: Not needed      [facility]     Shipping address confirmed in Epic.     Delivery Scheduled: Yes, Expected medication delivery date: 04/12/21.     Medication will be delivered via UPS to the prescription address in Epic WAM.    Carolyn Newton   [facility] Specialty Technician

## 2021-04-10 ENCOUNTER — Telehealth: Payer: Self-pay | Admitting: Internal Medicine

## 2021-04-10 DIAGNOSIS — G894 Chronic pain syndrome: Secondary | ICD-10-CM | POA: Diagnosis not present

## 2021-04-10 NOTE — Telephone Encounter (Signed)
Pt states [facility] will not prescribe oxycodone due to her being prescribe clonazePAM (KLONOPIN) 0.5 MG tablet ? ?Pt inquiring if provider can start prescribing her pain medication, pt states if provider can not prescribe pain medication, she is requesting to discontinue clonazePAM (KLONOPIN) 0.5 MG tablet ? ?Pt requesting a cb ?

## 2021-04-11 MED FILL — HUMIRA PEN CITRATE FREE 40 MG/0.4 ML: SUBCUTANEOUS | 28 days supply | Qty: 4 | Fill #11

## 2021-04-13 NOTE — Telephone Encounter (Signed)
Pt has stated she stopped taking the clonazePAM (KLONOPIN) 0.5 MG tablet. ? ?Pt provider Dr. Vira Blanco has retired and the new providers has stopped giving her the pain meds. Pt states she is needing her pain meds due to the pain she is in. ?

## 2021-04-13 NOTE — Telephone Encounter (Signed)
Noted.  Please continue pain management per [facility].  Take 0.25 mg nightly for 5 days then 0.25 mg every other night for 5 days, then stop.  Thanks ?

## 2021-04-18 ENCOUNTER — Ambulatory Visit (INDEPENDENT_AMBULATORY_CARE_PROVIDER_SITE_OTHER): Payer: Medicare Other | Admitting: Physical Medicine and Rehabilitation

## 2021-04-18 ENCOUNTER — Encounter: Payer: Self-pay | Admitting: Physical Medicine and Rehabilitation

## 2021-04-18 ENCOUNTER — Ambulatory Visit: Payer: Self-pay

## 2021-04-18 DIAGNOSIS — M25551 Pain in right hip: Secondary | ICD-10-CM

## 2021-04-18 NOTE — Progress Notes (Signed)
Pt state right hip pain. ?

## 2021-04-18 NOTE — Progress Notes (Signed)
? ?  [name] - 61 y.o. female MRN [id number]  Date of birth: 10-28-1960 ? ?Office Visit Note: ?Visit Date: 04/18/2021 ?PCP: Plotnikov, Evie Lacks, MD ?Referred by: Cassandria Anger, MD ? ?Subjective: ?No chief complaint on file. ? ?HPI:  [name] is a 61 y.o. female who comes in today at the request of Dr. Anderson Malta for planned Right anesthetic hip arthrogram with fluoroscopic guidance.  The patient has failed conservative care including home exercise, medications, time and activity modification.  This injection will be diagnostic and hopefully therapeutic.  Please see requesting physician notes for further details and justification. ? ?ROS Otherwise per HPI. ? ?Assessment & Plan: ?Visit Diagnoses:  ?  ICD-10-CM   ?1. Pain in right hip  M25.551 XR C-ARM NO REPORT  ?  Large Joint Inj: R hip joint  ?  ?  ?Plan: No additional findings.  ? ?Meds & Orders: No orders of the defined types were placed in this encounter. ?  ?Orders Placed This Encounter  ?Procedures  ? Large Joint Inj: R hip joint  ? XR C-ARM NO REPORT  ?  ?Follow-up: Return for visit to requesting provider as needed.  ? ?Procedures: ?Large Joint Inj: R hip joint on 04/18/2021 1:00 PM ?Indications: diagnostic evaluation and pain ?Details: 22 G 3.5 in needle, fluoroscopy-guided anterior approach ? ?Arthrogram: No ? ?Medications: 4 mL bupivacaine 0.25 %; 60 mg triamcinolone acetonide 40 MG/ML ?Outcome: tolerated well, no immediate complications ? ?There was excellent flow of contrast producing a partial arthrogram of the hip. The patient did have relief of symptoms during the anesthetic phase of the injection. ?Procedure, treatment alternatives, risks and benefits explained, specific risks discussed. Consent was given by the patient. Immediately prior to procedure a time out was called to verify the correct patient, procedure, equipment, support staff and site/side marked as required. Patient was prepped and draped in the  usual sterile fashion.  ? ?  ?   ? ?Clinical History: ?No specialty comments available.  ? ? ? ?Objective:  VS:  HT:    WT:   BMI:     BP:   HR: bpm  TEMP: ( )  RESP:  ?Physical Exam  ? ?Imaging: ?No results found. ?

## 2021-04-23 ENCOUNTER — Ambulatory Visit (INDEPENDENT_AMBULATORY_CARE_PROVIDER_SITE_OTHER): Payer: Medicare Other

## 2021-04-23 VITALS — BP 110/60 | HR 61 | Temp 97.6°F | Ht 67.0 in | Wt 269.2 lb

## 2021-04-23 DIAGNOSIS — Z0000 Encounter for general adult medical examination without abnormal findings: Secondary | ICD-10-CM

## 2021-04-23 NOTE — Progress Notes (Addendum)
? ?Subjective:  ? [name] is a 61 y.o. female who presents for Medicare Annual (Subsequent) preventive examination. ? ?Review of Systems    ? ?Cardiac Risk Factors include: advanced age (>5mn, >>22women);diabetes mellitus;dyslipidemia;hypertension;family history of premature cardiovascular disease;obesity (BMI >30kg/m2);sedentary lifestyle ? ?   ?Objective:  ?  ?Today's Vitals  ? 04/23/21 1344 04/23/21 1353  ?BP: 110/60   ?Pulse: 61   ?Temp: 97.6 ?F (36.4 ?C)   ?SpO2: 97%   ?Weight: 269 lb 3.2 oz (122.1 kg)   ?Height: '5\' 7"'$  (1.702 m)   ?PainSc:  2   ? ?Body mass index is 42.16 kg/m?. ? ? ?  04/23/2021  ?  1:58 PM 03/23/2021  ?  6:25 AM 03/21/2021  ?  2:18 PM 08/19/2020  ?  9:48 AM 03/21/2020  ?  2:56 PM 11/02/2019  ? 11:16 AM 04/29/2018  ?  4:11 PM  ?Advanced Directives  ?Does Patient Have a Medical Advance Directive? No No No No No Yes Yes  ?Type of Advance Directive      Living will;Healthcare Power of [place] ?Does patient want to make changes to medical advance directive?       No - Patient declined  ?Copy of [place]?      No - copy requested No - copy requested  ?Would patient like information on creating a medical advance directive? Yes (MAU/Ambulatory/Procedural Areas - Information given) No - Patient declined No - Patient declined  No - Patient declined    ? ? ?Current Medications (verified) ?Outpatient Encounter Medications as of 04/23/2021  ?Medication Sig  ? albuterol (VENTOLIN HFA) 108 (90 Base) MCG/ACT inhaler Inhale 2 puffs into the lungs every 6 (six) hours as needed for wheezing or shortness of breath.  ? B-D ULTRA-FINE 33 LANCETS MISC Use as instructed to test sugars once  daily  ? budesonide-formoterol (SYMBICORT) 160-4.5 MCG/ACT inhaler Inhale 2 puffs into the lungs TWICE DAILY (Patient taking differently: 2 puffs 2 (two) times daily.)  ? carvedilol (COREG) 12.5 MG tablet Take 1 tablet (12.5 mg total) by mouth 2 (two) times  daily with a meal.  ? Cholecalciferol (VITAMIN D-3) 125 MCG (5000 UT) TABS Take 5,000 Units by mouth daily.  ? Cyanocobalamin (VITAMIN B-12) 1000 MCG SUBL Place 1 tablet (1,000 mcg total) under the tongue daily. (Patient taking differently: Place 1,000 mcg under the tongue daily.)  ? dexlansoprazole (DEXILANT) 60 MG capsule Take 1 capsule (60 mg total) by mouth daily. (Patient taking differently: Take 60 mg by mouth daily.)  ? gabapentin (NEURONTIN) 300 MG capsule TAKE ONE CAPSULE BY MOUTH EVERY EVENING, MAY TAKE A SECOND CAPSULE DURING THE DAY AS NEEDED FOR PAIN (Patient taking differently: Take 300 mg by mouth 2 (two) times daily.)  ? glucose blood (ACCU-CHEK GUIDE) test strip Use to check blood sugar once a day.  ? HUMIRA PEN 40 MG/0.4ML PNKT Inject 40 mg as directed every Wednesday.   ? hydrOXYzine (ATARAX) 25 MG tablet Take 1 tablet (25 mg total) by mouth every 8 (eight) hours as needed. (Patient taking differently: Take 6.25-12.5 mg by mouth See admin instructions. Takes 6.25 mg in the morning and 12.5 at night)  ? metFORMIN (GLUCOPHAGE-XR) 500 MG 24 hr tablet Take 2 tablets (1,000 mg total) by mouth at bedtime.  ? Multiple Vitamins-Minerals (MULTIVITAMIN WOMEN 50+) TABS Take 1 tablet by mouth daily.  ? naloxone (NARCAN) nasal spray 4 mg/0.1 mL Place 1 spray into the  nose once as needed (overdose).  ? naltrexone (DEPADE) 50 MG tablet Take 12.5 mg by mouth at bedtime.   ? neomycin-polymyxin b-dexamethasone (MAXITROL) 3.5-10000-0.1 SUSP Place 1 drop into both eyes in the morning, at noon, in the evening, and at bedtime.  ? Omega-3 Fatty Acids (FISH OIL) 1000 MG CAPS Take 1,000 mg by mouth daily.  ? OVER THE COUNTER MEDICATION Take 10 mLs by mouth 2 (two) times daily. Makes her own sea moss gel  ? oxyCODONE-acetaminophen (PERCOCET) 10-325 MG tablet 1 tablet as needed Orally every 6 hrs  ? OXYGEN Inhale 2 L into the lungs continuous.  ? polyvinyl alcohol (LIQUIFILM TEARS) 1.4 % ophthalmic solution Place 1 drop  into both eyes as needed for dry eyes.  ? potassium chloride (KLOR-CON) 10 MEQ tablet Take 1 tablet (10 mEq total) by mouth daily.  ? topiramate (TOPAMAX) 50 MG tablet Take 1 tablet (50 mg total) by mouth 2 (two) times daily.  ? venlafaxine XR (EFFEXOR-XR) 75 MG 24 hr capsule Take 1 capsule (75 mg total) by mouth daily with breakfast.  ? XARELTO 20 MG TABS tablet Take 1 tablet (20 mg total) by mouth at bedtime.  ? ?No facility-administered encounter medications on file as of 04/23/2021.  ? ? ?Allergies (verified) ?Belsomra [suvorexant], Enalapril maleate, Latex, Nickel, Other, Morphine and related, Doxycycline, Hydrochlorothiazide, Hydroxychloroquine sulfate, Lyrica [pregabalin], and Tape  ? ?History: ?Past Medical History:  ?Diagnosis Date  ? ALLERGIC RHINITIS 10/26/2009  ? no current problems  ? B12 DEFICIENCY 08/25/2007  ? takes B12 supplement  ? Complication of anesthesia   ? pt has had difficulty following anesthesia with her knee in 2016-unable to care for herself afterward  ? Depression   ? takes effexor xr daily  ? Diabetes mellitus without complication ([place])   ? was on insulin but has been off since Nov 2015 and now only takes Metformin daily  ? DYSPNEA 04/28/2009  ? uses oxygen 24/7, on 2L via [place]  ? Esophageal reflux   ? takes Nexium daily  ? Fibromyalgia   ? Headache   ? last migraine 2-48yr ago;takes Topamax daily  ? History of shingles   ? Hypertension   ? takes Coreg daily  ? Insomnia   ? takes Nortriptyline nightly   ? Long-term memory loss   ? OSA (obstructive sleep apnea)   ? doesn't use CPAP;sleep study in epic from 2006  ? Osteoarthritis   ? PONV (postoperative nausea and vomiting)   ? on time in 2016 knee surgery  ? Rheumatoid arthritis ([place]   ? Sarcoidosis   ? Dr. ZLolita Patella ? Stroke ([facility]   ? Vitamin D deficiency   ? is supposed to take Vit D but can't afford it  ? ?Past Surgical History:  ?Procedure Laterality Date  ? APPENDECTOMY    ? arthroscopic knee surgery Right 11-12-04  ? AXILLARY ABCESS  IRRIGATION AND DEBRIDEMENT  Jul & Aug2012  ? BIOPSY  11/02/2019  ? Procedure: BIOPSY;  Surgeon: PJerene Bears MD;  [facility];  Service: Gastroenterology;;  ? CARPAL TUNNEL RELEASE Left 05/23/2014  ? Procedure: CARPAL TUNNEL RELEASE;  Surgeon: SMeredith Pel MD;  [place]  Service: Orthopedics;  Laterality: Left;  ? CHOLECYSTECTOMY N/A 02/11/2018  ? Procedure: LAPAROSCOPIC CHOLECYSTECTOMY WITH INTRAOPERATIVE CHOLANGIOGRAM ERAS PATHWAY;  Surgeon: TDonnie Mesa MD;  [place]  Service: General;  Laterality: N/A;  ? COLONOSCOPY WITH PROPOFOL N/A 11/02/2019  ? Procedure: COLONOSCOPY WITH PROPOFOL;  Surgeon: PZenovia Jarred  Jerilynn Mages, MD;  [facility];  Service: Gastroenterology;  Laterality: N/A;  ? cyst removed from top of buttocks  at age 88  ? DACRORHINOCYSTOTOMY Bilateral 03/23/2021  ? Procedure: ENDOSCOPIC DACROCYSTORHINOSTOMY;  Surgeon: Delia Chimes, MD;  Location: [place];  Service: Ophthalmology;  Laterality: Bilateral;  ? ENDOMETRIAL ABLATION    ? IR CHOLANGIOGRAM EXISTING TUBE  11/21/2017  ? IR CHOLANGIOGRAM EXISTING TUBE  01/16/2018  ? IR EXCHANGE BILIARY DRAIN  11/10/2017  ? IR PATIENT EVAL TECH 0-60 MINS  02/03/2018  ? IR PERC CHOLECYSTOSTOMY  09/13/2017  ? IR RADIOLOGIST EVAL & MGMT  10/08/2017  ? LACRIMAL DUCT EXPLORATION Right 06/26/2017  ? Procedure: LACRIMAL DUCT EXPLORATION AND ETHMOIDECTOMY;  Surgeon: Clista Bernhardt, MD;  Location: [place];  Service: Ophthalmology;  Laterality: Right;  ? LACRIMAL DUCT EXPLORATION Bilateral 03/23/2021  ? Procedure: NASAL LACRIMAL DUCT EXPLORATION;  Surgeon: Delia Chimes, MD;  Location: [place];  Service: Ophthalmology;  Laterality: Bilateral;  ? LACRIMAL TUBE INSERTION Bilateral 03/23/2021  ? Procedure: INSERTION OF CRAWFORD LACRIMAL STENT;  Surgeon: Delia Chimes, MD;  Location: [place];  Service: Ophthalmology;  Laterality: Bilateral;  ? POLYPECTOMY  11/02/2019  ? Procedure: POLYPECTOMY;  Surgeon: Jerene Bears, MD;  [facility];  Service: Gastroenterology;;  ? [place] Right 06/26/2017  ? Procedure: TEAR DUCT PROBING WITH STENT;  Surgeon: Clista Bernhardt, MD;  Location: [place];  Service: Ophthalmology;  Laterality: R

## 2021-04-23 NOTE — Patient Instructions (Signed)
[name] , ?Thank you for taking time to come for your Medicare Wellness Visit. I appreciate your ongoing commitment to your health goals. Please review the following plan we discussed and let me know if I can assist you in the future.  ? ?Screening recommendations/referrals: ?Colonoscopy: 11/02/2019 due every 10 years ?Mammogram: 08/03/2020; due every year ?Bone Density: never done ?Recommended yearly ophthalmology/optometry visit for glaucoma screening and checkup ?Recommended yearly dental visit for hygiene and checkup ? ?Vaccinations: ?Influenza vaccine: 10/13/2020 ?Pneumococcal vaccine: 12//10/2013, 07/19/2020 ?Tdap vaccine: 06/28/2014; due every 10 years ?Shingles vaccine: never done  ?Covid-19: 03/31/2019, 04/21/2019, 11/27/2019 ? ?Advanced directives: Advance directive discussed with you today. I have provided a copy for you to complete at home and have notarized. Once this is complete please bring a copy in to our office so we can scan it into your chart. ? ?Conditions/risks identified: Yes ? ?Next appointment: Please schedule your next Medicare Wellness Visit with your Nurse Health Advisor in 1 year by calling [phone]. ? ?Preventive Care 40-64 Years, Female ?Preventive care refers to lifestyle choices and visits with your health care provider that can promote health and wellness. ?What does preventive care include? ?A yearly physical exam. This is also called an annual well check. ?Dental exams once or twice a year. ?Routine eye exams. Ask your health care provider how often you should have your eyes checked. ?Personal lifestyle choices, including: ?Daily care of your teeth and gums. ?Regular physical activity. ?Eating a healthy diet. ?Avoiding tobacco and drug use. ?Limiting alcohol use. ?Practicing safe sex. ?Taking low-dose aspirin daily starting at age 7. ?Taking vitamin and mineral supplements as recommended by your health care provider. ?What happens during an annual well check? ?The services  and screenings done by your health care provider during your annual well check will depend on your age, overall health, lifestyle risk factors, and family history of disease. ?Counseling  ?Your health care provider may ask you questions about your: ?Alcohol use. ?Tobacco use. ?Drug use. ?Emotional well-being. ?Home and relationship well-being. ?Sexual activity. ?Eating habits. ?Work and work Statistician. ?Method of birth control. ?Menstrual cycle. ?Pregnancy history. ?Screening  ?You may have the following tests or measurements: ?Height, weight, and BMI. ?Blood pressure. ?Lipid and cholesterol levels. These may be checked every 5 years, or more frequently if you are over 44 years old. ?Skin check. ?Lung cancer screening. You may have this screening every year starting at age 40 if you have a 30-pack-year history of smoking and currently smoke or have quit within the past 15 years. ?Fecal occult blood test (FOBT) of the stool. You may have this test every year starting at age 41. ?Flexible sigmoidoscopy or colonoscopy. You may have a sigmoidoscopy every 5 years or a colonoscopy every 10 years starting at age 35. ?Hepatitis C blood test. ?Hepatitis B blood test. ?Sexually transmitted disease (STD) testing. ?Diabetes screening. This is done by checking your blood sugar (glucose) after you have not eaten for a while (fasting). You may have this done every 1-3 years. ?Mammogram. This may be done every 1-2 years. Talk to your health care provider about when you should start having regular mammograms. This may depend on whether you have a family history of breast cancer. ?BRCA-related cancer screening. This may be done if you have a family history of breast, ovarian, tubal, or peritoneal cancers. ?Pelvic exam and Pap test. This may be done every 3 years starting at age 61. Starting at age 6, this may be done every 5 years  if you have a Pap test in combination with an HPV test. ?Bone density scan. This is done to screen  for osteoporosis. You may have this scan if you are at high risk for osteoporosis. ?Discuss your test results, treatment options, and if necessary, the need for more tests with your health care provider. ?Vaccines  ?Your health care provider may recommend certain vaccines, such as: ?Influenza vaccine. This is recommended every year. ?Tetanus, diphtheria, and acellular pertussis (Tdap, Td) vaccine. You may need a Td booster every 10 years. ?Zoster vaccine. You may need this after age 62. ?Pneumococcal 13-valent conjugate (PCV13) vaccine. You may need this if you have certain conditions and were not previously vaccinated. ?Pneumococcal polysaccharide (PPSV23) vaccine. You may need one or two doses if you smoke cigarettes or if you have certain conditions. ?Talk to your health care provider about which screenings and vaccines you need and how often you need them. ?This information is not intended to replace advice given to you by your health care provider. Make sure you discuss any questions you have with your health care provider. ?Document Released: 01/27/2015 Document Revised: 09/20/2015 Document Reviewed: 11/01/2014 ?Elsevier Interactive Patient Education ? 2017 [employer] ? ? ? ?Fall Prevention in the Home ?Falls can cause injuries. They can happen to people of all ages. There are many things you can do to make your home safe and to help prevent falls. ?What can I do on the outside of my home? ?Regularly fix the edges of walkways and driveways and fix any cracks. ?Remove anything that might make you trip as you walk through a door, such as a raised step or threshold. ?Trim any bushes or trees on the path to your home. ?Use bright outdoor lighting. ?Clear any walking paths of anything that might make someone trip, such as rocks or tools. ?Regularly check to see if handrails are loose or broken. Make sure that both sides of any steps have handrails. ?Any raised decks and porches should have guardrails on the  edges. ?Have any leaves, snow, or ice cleared regularly. ?Use sand or salt on walking paths during winter. ?Clean up any spills in your garage right away. This includes oil or grease spills. ?What can I do in the bathroom? ?Use night lights. ?Install grab bars by the toilet and in the tub and shower. Do not use towel bars as grab bars. ?Use non-skid mats or decals in the tub or shower. ?If you need to sit down in the shower, use a plastic, non-slip stool. ?Keep the floor dry. Clean up any water that spills on the floor as soon as it happens. ?Remove soap buildup in the tub or shower regularly. ?Attach bath mats securely with double-sided non-slip rug tape. ?Do not have throw rugs and other things on the floor that can make you trip. ?What can I do in the bedroom? ?Use night lights. ?Make sure that you have a light by your bed that is easy to reach. ?Do not use any sheets or blankets that are too big for your bed. They should not hang down onto the floor. ?Have a firm chair that has side arms. You can use this for support while you get dressed. ?Do not have throw rugs and other things on the floor that can make you trip. ?What can I do in the kitchen? ?Clean up any spills right away. ?Avoid walking on wet floors. ?Keep items that you use a lot in easy-to-reach places. ?If you need to reach something  above you, use a strong step stool that has a grab bar. ?Keep electrical cords out of the way. ?Do not use floor polish or wax that makes floors slippery. If you must use wax, use non-skid floor wax. ?Do not have throw rugs and other things on the floor that can make you trip. ?What can I do with my stairs? ?Do not leave any items on the stairs. ?Make sure that there are handrails on both sides of the stairs and use them. Fix handrails that are broken or loose. Make sure that handrails are as long as the stairways. ?Check any carpeting to make sure that it is firmly attached to the stairs. Fix any carpet that is loose or  worn. ?Avoid having throw rugs at the top or bottom of the stairs. If you do have throw rugs, attach them to the floor with carpet tape. ?Make sure that you have a light switch at the top of the stairs and th

## 2021-04-26 ENCOUNTER — Other Ambulatory Visit: Payer: Self-pay | Admitting: Internal Medicine

## 2021-04-27 DIAGNOSIS — D869 Sarcoidosis, unspecified: Principal | ICD-10-CM

## 2021-04-27 MED ORDER — HUMIRA PEN CITRATE FREE 40 MG/0.4 ML
SUBCUTANEOUS | 11 refills | 0.00000 days | Status: CP
Start: 2021-04-27 — End: ?
  Filled 2021-05-08: qty 4, 28d supply, fill #0

## 2021-04-27 NOTE — Unmapped (Signed)
Requested Prescriptions     Pending Prescriptions Disp Refills    HUMIRA PEN CITRATE FREE 40 MG/0.4 ML 4 each 6     Sig: Inject the contents of 1 syringe (40 mg total) under the skin every seven (7) days.      Patient last seen 10/16/2020.  New order pended if appropriate.

## 2021-04-27 NOTE — Unmapped (Signed)
Request already responded to by other means

## 2021-04-27 NOTE — Unmapped (Signed)
[facility] Refill Coordination Note    **Patient missed 03/28/21 injectino due to hospital stay. Resumed Humira injections on 04/04/21**    Specialty Medication(s) to be Shipped:   Inflammatory Disorders: Humira    Other medication(s) to be shipped: No additional medications requested for fill at this time     Carolyn Newton, DOB: 10-03-1960  Phone: [phone] (home)       All above HIPAA information was verified with patient.     Was a Nurse, learning disability used for this call? No    Completed refill call assessment today to schedule patient's medication shipment from the [facility] [phone]).  All relevant notes have been reviewed.     Specialty medication(s) and dose(s) confirmed: Regimen is correct and unchanged.   Changes to medications: Carolyn Newton reports stopping the following medications: pt  states  no  longer taking Clonazepam   Changes to insurance: No  New side effects reported not previously addressed with a pharmacist or physician: None reported  Questions for the pharmacist: No    Confirmed patient received a Conservation officer, historic buildings and a Surveyor, mining with first shipment. The patient will receive a drug information handout for each medication shipped and additional FDA Medication Guides as required.       DISEASE/MEDICATION-SPECIFIC INFORMATION        For patients on injectable medications: Patient currently has 3  doses left.  Next injection is scheduled for 05/02/21 .    SPECIALTY MEDICATION ADHERENCE     Medication Adherence    Patient reported X missed doses in the last month: 1  Specialty Medication: HUMIRA(CF) PEN 40 mg/0.4 mL  Patient is on additional specialty medications: No  Patient is on more than two specialty medications: No  Any gaps in refill history greater than 2 weeks in the last 3 months: no  Demonstrates understanding of importance of adherence: yes              Were doses missed due to medication being on hold? No    HUMIRA(CF) PEN 40 mg/0.4 mL : 14 days of medicine on hand        REFERRAL TO PHARMACIST     Referral to the pharmacist: Not needed      [facility]     Shipping address confirmed in Epic.     Delivery Scheduled: Yes, Expected medication delivery date: 05/09/21 .  However, Rx request for refills was sent to the provider as there are none remaining.     Medication will be delivered via UPS to the prescription address in Epic WAM.    Carolyn Newton   [facility] Specialty Technician

## 2021-05-03 ENCOUNTER — Other Ambulatory Visit: Payer: Self-pay | Admitting: Internal Medicine

## 2021-05-03 ENCOUNTER — Ambulatory Visit (INDEPENDENT_AMBULATORY_CARE_PROVIDER_SITE_OTHER): Payer: Medicare Other | Admitting: Sports Medicine

## 2021-05-03 DIAGNOSIS — B359 Dermatophytosis, unspecified: Secondary | ICD-10-CM | POA: Diagnosis not present

## 2021-05-03 DIAGNOSIS — L439 Lichen planus, unspecified: Secondary | ICD-10-CM | POA: Diagnosis not present

## 2021-05-03 DIAGNOSIS — E1142 Type 2 diabetes mellitus with diabetic polyneuropathy: Secondary | ICD-10-CM | POA: Diagnosis not present

## 2021-05-03 DIAGNOSIS — M79674 Pain in right toe(s): Secondary | ICD-10-CM | POA: Diagnosis not present

## 2021-05-03 DIAGNOSIS — M79675 Pain in left toe(s): Secondary | ICD-10-CM | POA: Diagnosis not present

## 2021-05-03 DIAGNOSIS — B351 Tinea unguium: Secondary | ICD-10-CM | POA: Diagnosis not present

## 2021-05-03 MED ORDER — NEOMYCIN-POLYMYXIN-HC 3.5-10000-1 OT SOLN: OTIC | 0 refills | Status: DC

## 2021-05-03 NOTE — Progress Notes (Signed)
?Subjective: ?[name] is a 61 y.o. female patient with history of diabetes who presents to office today complaining of some soreness at her left big toenail-had her son trim her toenails about 1 to 2 weeks ago and afterwards they became sore denies any significant redness swelling drainage or any other concerns at this time except a occasional burning and itching sensation to both feet. ? ?States that she has not been here in a while due to issues with her eye and having to go to multiple appointments for this. ? ?Fasting blood sugar and last PCP visit not recorded. ?Patient Active Problem List  ? Diagnosis Date Noted  ? Nasolacrimal duct obstruction, acquired, bilateral 03/23/2021  ? Chronic pain disorder 03/23/2021  ? Dacrocystitis, left 11/20/2020  ? Cellulitis, face 11/20/2020  ? Well adult exam 07/19/2020  ? Benign neoplasm of ascending colon   ? Benign neoplasm of descending colon   ? Benign neoplasm of sigmoid colon   ? Chronic diarrhea   ? Diarrhea 07/06/2019  ? Shoulder pain, bilateral 02/01/2019  ? Physical deconditioning 01/28/2019  ? Chronic anticoagulation 11/19/2018  ? Insulin dependent diabetes mellitus type IA ([place]) 11/19/2018  ? Oxygen dependent 11/19/2018  ? Family history of colon cancer 10/26/2018  ? Myringotomy tube status 08/21/2018  ? Vaginal candidiasis 07/15/2018  ? Nasal crusting 06/12/2018  ? Chronic serous otitis media of left ear 06/04/2018  ? Mixed conductive and sensorineural hearing loss of left ear with restricted hearing of right ear 06/04/2018  ? Chronic cholecystitis with calculus 02/11/2018  ? Thrush, oral 01/15/2018  ? Nasal sinus congestion 01/15/2018  ? Dyslipidemia 10/28/2017  ? Pruritus 10/23/2017  ? Abnormal liver function test   ? Anemia 09/24/2017  ? Migraines 09/17/2017  ? Cholecystitis, acute 09/12/2017  ? COPD (chronic obstructive pulmonary disease) ([place]) 12/30/2016  ? Diastolic dysfunction 18/84/1660  ? OSA (obstructive sleep apnea) 09/30/2016  ?  Chronic respiratory failure with hypoxia ([place]) 08/23/2016  ? Pulmonary embolism ([place]) 07/21/2016  ? DVT (deep venous thrombosis) ([place]) 07/21/2016  ? Acute bronchitis 01/30/2016  ? Weight gain 12/20/2015  ? Controlled type 2 diabetes mellitus with chronic kidney disease, without long-term current use of insulin ([place]) 02/01/2015  ? Abnormal SPEP 06/04/2013  ? Memory loss 04/27/2013  ? Panic attacks 03/17/2013  ? Rash 10/27/2012  ? Pulmonary nodules 07/20/2012  ? CAD in native artery 05/28/2012  ? Seborrheic dermatitis 01/20/2012  ? Obesity, Class III, BMI 40-49.9 (morbid obesity) ([place]) 03/07/2010  ? INSOMNIA, CHRONIC 03/07/2010  ? Allergic rhinitis 10/26/2009  ? Grief reaction 08/01/2009  ? Essential hypertension 08/27/2007  ? B12 deficiency 08/25/2007  ? Vitamin D deficiency 08/25/2007  ? Urinary tract infection 02/07/2007  ? SINUSITIS, ACUTE 02/02/2007  ? Osteoarthritis 02/02/2007  ? Sarcoidosis 08/14/2006  ? Anxiety disorder 08/14/2006  ? Major depressive disorder, recurrent episode ([place]) 08/14/2006  ? GERD 08/14/2006  ? ?Current Outpatient Medications on File Prior to Visit  ?Medication Sig Dispense Refill  ? albuterol (VENTOLIN HFA) 108 (90 Base) MCG/ACT inhaler Inhale 2 puffs into the lungs every 6 (six) hours as needed for wheezing or shortness of breath. 8.5 g 3  ? B-D ULTRA-FINE 33 LANCETS MISC Use as instructed to test sugars once  daily 100 each 12  ? budesonide-formoterol (SYMBICORT) 160-4.5 MCG/ACT inhaler Inhale 2 puffs into the lungs TWICE DAILY (Patient taking differently: 2 puffs 2 (two) times daily.) 10.2 g 5  ? carvedilol (COREG) 12.5 MG tablet Take 1 tablet (12.5 mg total) by mouth  2 (two) times daily with a meal. 180 tablet 1  ? Cholecalciferol (VITAMIN D-3) 125 MCG (5000 UT) TABS Take 5,000 Units by mouth daily.    ? Cyanocobalamin (VITAMIN B-12) 1000 MCG SUBL Place 1 tablet (1,000 mcg total) under the tongue daily. (Patient taking differently: Place 1,000 mcg under the tongue daily.) 100 tablet  3  ? dexlansoprazole (DEXILANT) 60 MG capsule Take 1 capsule (60 mg total) by mouth daily. (Patient taking differently: Take 60 mg by mouth daily.) 90 capsule 1  ? gabapentin (NEURONTIN) 300 MG capsule TAKE ONE CAPSULE BY MOUTH EVERY EVENING, MAY TAKE A SECOND CAPSULE DURING THE DAY AS NEEDED FOR PAIN (Patient taking differently: Take 300 mg by mouth 2 (two) times daily.) 180 capsule 1  ? glucose blood (ACCU-CHEK GUIDE) test strip Use to check blood sugar once a day. 100 each 12  ? HUMIRA PEN 40 MG/0.4ML PNKT Inject 40 mg as directed every Wednesday.     ? hydrOXYzine (ATARAX) 25 MG tablet Take 1 tablet (25 mg total) by mouth every 8 (eight) hours as needed. (Patient taking differently: Take 6.25-12.5 mg by mouth See admin instructions. Takes 6.25 mg in the morning and 12.5 at night) 60 tablet 3  ? Multiple Vitamins-Minerals (MULTIVITAMIN WOMEN 50+) TABS Take 1 tablet by mouth daily.    ? naloxone (NARCAN) nasal spray 4 mg/0.1 mL Place 1 spray into the nose once as needed (overdose).    ? naltrexone (DEPADE) 50 MG tablet Take 12.5 mg by mouth at bedtime.     ? neomycin-polymyxin b-dexamethasone (MAXITROL) 3.5-10000-0.1 SUSP Place 1 drop into both eyes in the morning, at noon, in the evening, and at bedtime.    ? Omega-3 Fatty Acids (FISH OIL) 1000 MG CAPS Take 1,000 mg by mouth daily.    ? OVER THE COUNTER MEDICATION Take 10 mLs by mouth 2 (two) times daily. Makes her own sea moss gel    ? oxyCODONE-acetaminophen (PERCOCET) 10-325 MG tablet 1 tablet as needed Orally every 6 hrs    ? OXYGEN Inhale 2 L into the lungs continuous.    ? polyvinyl alcohol (LIQUIFILM TEARS) 1.4 % ophthalmic solution Place 1 drop into both eyes as needed for dry eyes. 15 mL 0  ? potassium chloride (KLOR-CON) 10 MEQ tablet Take 1 tablet (10 mEq total) by mouth daily. 90 tablet 3  ? topiramate (TOPAMAX) 50 MG tablet Take 1 tablet (50 mg total) by mouth 2 (two) times daily. 180 tablet 1  ? venlafaxine XR (EFFEXOR-XR) 75 MG 24 hr capsule Take 1  capsule (75 mg total) by mouth daily with breakfast. 30 capsule 11  ? XARELTO 20 MG TABS tablet Take 1 tablet (20 mg total) by mouth at bedtime. 90 tablet 2  ? ?No current facility-administered medications on file prior to visit.  ? ?Allergies  ?Allergen Reactions  ? Belsomra [Suvorexant] Other (See Comments)  ?  [place] out of bed while asleep: "I'm waking up as I'm falling on the floor;" "Night terrors"  ? Enalapril Maleate Cough  ? Latex Hives  ? Nickel Other (See Comments)  ?  Blisters  ?Pt has a titanium right and left knee - nickel causes skin irritations that form into blisters and sores  ? Other Other (See Comments)  ?  Patient has Sarcoidosis and can't tolerate any metals  ? Morphine And Related Itching and Nausea Only  ? Doxycycline Diarrhea and Nausea And Vomiting  ? Hydrochlorothiazide Other (See Comments)  ?  Low potassium levels   ?  Hydroxychloroquine Sulfate Other (See Comments)  ?  Vision changes   ? Lyrica [Pregabalin] Other (See Comments)  ?  "Made me feel high"  ? Tape Other (See Comments)  ?  Medical tape causes bruising  ? ? ?Recent Results (from the past 2160 hour(s))  ?Glucose, capillary     Status: Abnormal  ? Collection Time: 03/21/21  1:43 PM  ?Result Value Ref Range  ? Glucose-Capillary 111 (H) 70 - 99 mg/dL  ?  Comment: Glucose reference range applies only to samples taken after fasting for at least 8 hours.  ?SARS CORONAVIRUS 2 (TAT 6-24 HRS) Nasopharyngeal Nasopharyngeal Swab     Status: None  ? Collection Time: 03/21/21  2:24 PM  ? Specimen: Nasopharyngeal Swab  ?Result Value Ref Range  ? SARS Coronavirus 2 NEGATIVE NEGATIVE  ?  Comment: (NOTE) ?SARS-CoV-2 target nucleic acids are NOT DETECTED. ? ?The SARS-CoV-2 RNA is generally detectable in upper and lower ?respiratory specimens during the acute phase of infection. Negative ?results do not preclude SARS-CoV-2 infection, do not rule out ?co-infections with other pathogens, and should not be used as the ?sole basis for treatment or other  patient management decisions. ?Negative results must be combined with clinical observations, ?patient history, and epidemiological information. The expected ?result is Negative. ? ?Fact Sheet for [employer]

## 2021-05-07 ENCOUNTER — Other Ambulatory Visit: Payer: Self-pay | Admitting: Internal Medicine

## 2021-05-07 DIAGNOSIS — J449 Chronic obstructive pulmonary disease, unspecified: Secondary | ICD-10-CM | POA: Diagnosis not present

## 2021-05-08 MED ORDER — BUPIVACAINE HCL 0.25 % IJ SOLN
4.0000 mL | INTRAMUSCULAR | Status: AC | PRN
  Administered 2021-04-18: 4 mL via INTRA_ARTICULAR

## 2021-05-08 MED ORDER — TRIAMCINOLONE ACETONIDE 40 MG/ML IJ SUSP
60.0000 mg | INTRAMUSCULAR | Status: AC | PRN
  Administered 2021-04-18: 60 mg via INTRA_ARTICULAR

## 2021-05-11 ENCOUNTER — Other Ambulatory Visit: Payer: Self-pay | Admitting: Internal Medicine

## 2021-05-14 ENCOUNTER — Telehealth: Payer: Self-pay | Admitting: Sports Medicine

## 2021-05-14 NOTE — Telephone Encounter (Signed)
Pt called asking if you could call the medication you called in could be sent to [facility] because they deliver the prescription to her. ?

## 2021-05-15 ENCOUNTER — Other Ambulatory Visit: Payer: Self-pay | Admitting: Sports Medicine

## 2021-05-15 MED ORDER — NEOMYCIN-POLYMYXIN-HC 3.5-10000-1 OT SOLN: OTIC | 0 refills | Status: DC

## 2021-05-15 NOTE — Telephone Encounter (Signed)
Notified pt rx was sent to [facility].Marland KitchenMarland Kitchen ?

## 2021-05-15 NOTE — Progress Notes (Signed)
Corticosporin sol sent to [facility]  ? ?

## 2021-05-16 ENCOUNTER — Other Ambulatory Visit: Payer: Self-pay | Admitting: *Deleted

## 2021-05-16 NOTE — Patient Instructions (Signed)
Visit Information ? ?Thank you for taking time to visit with me today. Please don't hesitate to contact me if I can be of assistance to you before our next scheduled telephone appointment. ? ?Following are the goals we discussed today:  ?Current Barriers:  ?Knowledge Deficits related to plan of care for management of COPD  ? ?RNCM Clinical Goal(s):  ?Patient will verbalize understanding of plan for management of COPD as evidenced by no COPD exacerbation and adhering to low sodium diet through collaboration with RN Care manager, provider, and care team.  ? ?Interventions: ?Inter-disciplinary care team collaboration (see longitudinal plan of care) ?Evaluation of current treatment plan related to  self management and patient's adherence to plan as established by provider ? ? ?COPD Interventions:  (Status:  Goal on track:  Yes.) Long Term Goal ?Provided patient with basic written and verbal COPD education on self care/management/and exacerbation prevention ?Advised patient to track and manage COPD triggers ?Advised patient to self assesses COPD action plan zone and make appointment with provider if in the yellow zone for 48 hours without improvement ?Discussed the importance of adequate rest and management of fatigue with COPD ? ?Patient Goals/Self-Care Activities: ?Take all medications as prescribed ?Attend all scheduled provider appointments ?Call pharmacy for medication refills 3-7 days in advance of running out of medications ?Perform all self care activities independently  ?Perform IADL's (shopping, preparing meals, housekeeping, managing finances) independently ?Call provider office for new concerns or questions  ?call the Suicide and Crisis Lifeline: 988 if experiencing a Mental Health or [place]  ?limit outdoor activity during cold weather ?listen for public air quality announcements every day ?develop a rescue plan ?eliminate symptom triggers at home ?follow rescue plan if symptoms  flare-up ?keep follow-up appointments: with PCP, Pulmonologist and eye Dr ?eat healthy/prescribed diet: low sodium diet ?use devices that will help like a cane, sock-puller or reacher ?do breathing exercises every day ? ?Follow Up Plan:  Telephone follow up appointment with care management team member scheduled for:  May 2023. ?The patient has been provided with contact information for the care management team and has been advised to call with any health related questions or concerns.   ? ?[id number] Per patient her breathing has been good. She  has been taking her medications as per ordered. Patient is upset and emotional about her clonazepam being stopped. Patient will follow up with pain clinic on April 22, 2021.  ? ?Our next appointment is by telephone on August 28, 2021 ? ?Please call Johny Shock if you need to cancel or reschedule your appointment.  ? ?Please call the Suicide and Crisis Lifeline: 988 if you are experiencing a Mental Health or [place] or need someone to talk to. ? ?The patient verbalized understanding of instructions, educational materials, and care plan provided today and agreed to receive a mailed copy of patient instructions, educational materials, and care plan.  ? ?Telephone follow up appointment with care management team member scheduled for: ?The patient has been provided with contact information for the care management team and has been advised to call with any health related questions or concerns.  ? ?SIGNATURE ? ?Johny Shock BSN RN ?[facility] ?[phone]  ? ?  ?

## 2021-05-22 DIAGNOSIS — G894 Chronic pain syndrome: Secondary | ICD-10-CM | POA: Diagnosis not present

## 2021-05-22 DIAGNOSIS — Z79891 Long term (current) use of opiate analgesic: Secondary | ICD-10-CM | POA: Diagnosis not present

## 2021-06-01 NOTE — Unmapped (Signed)
[facility] Refill Coordination Note    Specialty Medication(s) to be Shipped:   Inflammatory Disorders: Humira    Other medication(s) to be shipped: No additional medications requested for fill at this time     Carolyn Newton, DOB: 30-Dec-1960  Phone: [phone] (home)       All above HIPAA information was verified with patient.     Was a Nurse, learning disability used for this call? No    Completed refill call assessment today to schedule patient's medication shipment from the [facility] [phone]).  All relevant notes have been reviewed.     Specialty medication(s) and dose(s) confirmed: Regimen is correct and unchanged.   Changes to medications: Carolyn Newton reports no changes at this time.  Changes to insurance: No  New side effects reported not previously addressed with a pharmacist or physician: None reported  Questions for the pharmacist: No    Confirmed patient received a Conservation officer, historic buildings and a Surveyor, mining with first shipment. The patient will receive a drug information handout for each medication shipped and additional FDA Medication Guides as required.       DISEASE/MEDICATION-SPECIFIC INFORMATION        For patients on injectable medications: Patient currently has 3 doses left.  Next injection is scheduled for 06/06/2021.    SPECIALTY MEDICATION ADHERENCE     Medication Adherence    Patient reported X missed doses in the last month: 0  Specialty Medication: Humira  Patient is on additional specialty medications: No  Any gaps in refill history greater than 2 weeks in the last 3 months: no  Demonstrates understanding of importance of adherence: yes  Informant: patient  Reliability of informant: reliable  Confirmed plan for next specialty medication refill: delivery by pharmacy  Refills needed for supportive medications: not needed        Were doses missed due to medication being on hold? No    REFERRAL TO PHARMACIST     Referral to the pharmacist: Not needed      [facility]     Shipping address confirmed in Epic.     Delivery Scheduled: Yes, Expected medication delivery date: 06/06/2021.     Medication will be delivered via UPS to the prescription address in Epic WAM.    Carolyn Newton D Carolyn Newton   [facility] Specialty Technician

## 2021-06-05 ENCOUNTER — Ambulatory Visit: Payer: Medicare Other

## 2021-06-05 DIAGNOSIS — M2011 Hallux valgus (acquired), right foot: Secondary | ICD-10-CM

## 2021-06-05 DIAGNOSIS — M2041 Other hammer toe(s) (acquired), right foot: Secondary | ICD-10-CM

## 2021-06-05 DIAGNOSIS — E1142 Type 2 diabetes mellitus with diabetic polyneuropathy: Secondary | ICD-10-CM

## 2021-06-05 MED FILL — HUMIRA PEN CITRATE FREE 40 MG/0.4 ML: SUBCUTANEOUS | 28 days supply | Qty: 4 | Fill #1

## 2021-06-05 NOTE — Progress Notes (Signed)
SITUATION Reason for Consult: Evaluation for Prefabricated Diabetic Shoes and Custom Diabetic Inserts. Patient / Caregiver Report: Patient would like well fitting shoes  OBJECTIVE DATA: Patient History / Diagnosis:    ICD-10-CM   1. Diabetic polyneuropathy associated with type 2 diabetes mellitus (HCC)  E11.42     2. Hammer toes of both feet  M20.41    M20.42     3. Hallux valgus of right foot  M20.11       Physician Treating Diabetes:  Elroy Channel MD  Current or Previous Devices:   Current user  In-Person Foot Examination: Ulcers & Callousing:   None Deformities:    Hallux valgus, hammertoes Sensation:    Compromised  Shoe Size:     10W  ORTHOTIC RECOMMENDATION Recommended Devices: - 1x pair prefabricated PDAC approved diabetic shoes; Patient Selected [employer] 849 Black Size 10W - 3x pair custom-to-patient PDAC approved vacuum formed diabetic insoles.  GOALS OF SHOES AND INSOLES - Reduce shear and pressure - Reduce / Prevent callus formation - Reduce / Prevent ulceration - Protect the fragile healing compromised diabetic foot.  Patient would benefit from diabetic shoes and inserts as patient has diabetes mellitus and the patient has one or more of the following conditions: - History of partial or complete amputation of the foot - History of previous foot ulceration. - History of pre-ulcerative callus - Peripheral neuropathy with evidence of callus formation - Foot deformity - Poor circulation  ACTIONS PERFORMED Potential out of pocket cost was communicated to patient. Patient understood and consented to measurement and casting. Patient was casted for insoles via crush box and measured for shoes via brannock device. Procedure was explained and patient tolerated procedure well. All questions were answered and concerns addressed. CMN sent to treating physician. Casts were shipped to central fabrication for HOLD until Certificate of Medical Necessity or otherwise  necessary authorization from insurance is obtained.  PLAN Shoes are to be ordered and casts released from hold once all appropriate paperwork is complete. Patient is to be contacted and scheduled for fitting once shoes and insoles have been fabricated and received.

## 2021-06-06 DIAGNOSIS — J449 Chronic obstructive pulmonary disease, unspecified: Secondary | ICD-10-CM | POA: Diagnosis not present

## 2021-06-13 ENCOUNTER — Other Ambulatory Visit: Payer: Self-pay | Admitting: Internal Medicine

## 2021-06-13 NOTE — Telephone Encounter (Signed)
Rec'd msg stating The original prescription was discontinued on 04/13/2021 by Plotnikov, Evie Lacks, MD for the following reason: Patient Preference. Check [place] registry last filled 03/14/2021.Marland KitchenJohny Chess

## 2021-06-28 ENCOUNTER — Telehealth: Payer: Self-pay

## 2021-06-28 NOTE — Telephone Encounter (Signed)
CMN Received - Shoes ordered and casts released from fabrication hold.  

## 2021-07-02 ENCOUNTER — Ambulatory Visit (INDEPENDENT_AMBULATORY_CARE_PROVIDER_SITE_OTHER): Payer: Medicare Other | Admitting: Internal Medicine

## 2021-07-02 ENCOUNTER — Encounter: Payer: Self-pay | Admitting: Internal Medicine

## 2021-07-02 ENCOUNTER — Other Ambulatory Visit: Payer: Self-pay | Admitting: Internal Medicine

## 2021-07-02 VITALS — BP 140/90 | HR 77 | Temp 98.6°F | Ht 67.0 in | Wt 269.0 lb

## 2021-07-02 DIAGNOSIS — D509 Iron deficiency anemia, unspecified: Secondary | ICD-10-CM | POA: Diagnosis not present

## 2021-07-02 DIAGNOSIS — F339 Major depressive disorder, recurrent, unspecified: Secondary | ICD-10-CM

## 2021-07-02 DIAGNOSIS — F410 Panic disorder [episodic paroxysmal anxiety] without agoraphobia: Secondary | ICD-10-CM

## 2021-07-02 DIAGNOSIS — K219 Gastro-esophageal reflux disease without esophagitis: Secondary | ICD-10-CM | POA: Diagnosis not present

## 2021-07-02 DIAGNOSIS — F419 Anxiety disorder, unspecified: Secondary | ICD-10-CM

## 2021-07-02 DIAGNOSIS — I10 Essential (primary) hypertension: Secondary | ICD-10-CM | POA: Diagnosis not present

## 2021-07-02 LAB — COMPREHENSIVE METABOLIC PANEL
ALT: 15 U/L (ref 0–35)
AST: 18 U/L (ref 0–37)
Albumin: 3.9 g/dL (ref 3.5–5.2)
Alkaline Phosphatase: 69 U/L (ref 39–117)
BUN: 22 mg/dL (ref 6–23)
CO2: 29 mEq/L (ref 19–32)
Calcium: 9.7 mg/dL (ref 8.4–10.5)
Chloride: 106 mEq/L (ref 96–112)
Creatinine, Ser: 0.98 mg/dL (ref 0.40–1.20)
GFR: 62.55 mL/min (ref >60.00)
Glucose, Bld: 146 mg/dL — ABNORMAL HIGH (ref 70–99)
Potassium: 4.1 mEq/L (ref 3.5–5.1)
Sodium: 142 mEq/L (ref 135–145)
Total Bilirubin: 0.4 mg/dL (ref 0.2–1.2)
Total Protein: 7.1 g/dL (ref 6.0–8.3)

## 2021-07-02 LAB — CBC WITH DIFFERENTIAL/PLATELET
Basophils Absolute: 0 10*3/uL (ref 0.0–0.1)
Basophils Relative: 0.7 % (ref 0.0–3.0)
Eosinophils Absolute: 0.1 10*3/uL (ref 0.0–0.7)
Eosinophils Relative: 2 % (ref 0.0–5.0)
HCT: 38 % (ref 36.0–46.0)
Hemoglobin: 12.7 g/dL (ref 12.0–15.0)
Lymphocytes Relative: 38.4 % (ref 12.0–46.0)
Lymphs Abs: 2.2 10*3/uL (ref 0.7–4.0)
MCHC: 33.4 g/dL (ref 30.0–36.0)
MCV: 93 fl (ref 78.0–100.0)
Monocytes Absolute: 0.6 10*3/uL (ref 0.1–1.0)
Monocytes Relative: 10.8 % (ref 3.0–12.0)
Neutro Abs: 2.7 10*3/uL (ref 1.4–7.7)
Neutrophils Relative %: 48.1 % (ref 43.0–77.0)
Platelets: 186 10*3/uL (ref 150.0–400.0)
RBC: 4.09 Mil/uL (ref 3.87–5.11)
RDW: 15.1 % (ref 11.5–15.5)
WBC: 5.6 10*3/uL (ref 4.0–10.5)

## 2021-07-02 LAB — TSH: TSH: 1.18 u[IU]/mL (ref 0.35–5.50)

## 2021-07-02 MED ORDER — QUETIAPINE FUMARATE 25 MG PO TABS: ORAL_TABLET | ORAL | 3 refills | Status: DC

## 2021-07-02 MED ORDER — QUETIAPINE FUMARATE 25 MG PO TABS
ORAL_TABLET | ORAL | 3 refills | Status: DC
Start: 2021-07-02 — End: 2021-08-28

## 2021-07-02 NOTE — Assessment & Plan Note (Signed)
Worse: severe anxiety and insomnia - worse (off Clonazepam x 2 months due to her [facility] requirement). Start on Seroquel.   Potential benefits of a long term Seroquel use as well as potential risks and complications (i.e. somnolence, constipation and others) were explained to the patient and were aknowledged.

## 2021-07-02 NOTE — Assessment & Plan Note (Signed)
Cont on Coreg, Lasix

## 2021-07-02 NOTE — Progress Notes (Signed)
Subjective:  Patient ID: [name], female    DOB: May 01, 1960  Age: 61 y.o. MRN: [id number]  CC: No chief complaint on file.   HPI [name] [facility] presents for severe anxiety and insomnia - worse (off Clonazepam x 2 months due to her [facility] requirement). F/u on sarcoidosis, GERD, HTN  Outpatient Medications Prior to Visit  Medication Sig Dispense Refill   albuterol (VENTOLIN HFA) 108 (90 Base) MCG/ACT inhaler Inhale 2 puffs into the lungs every 6 (six) hours as needed for wheezing or shortness of breath. 8.5 g 3   B-D ULTRA-FINE 33 LANCETS MISC Use as instructed to test sugars once  daily 100 each 12   budesonide-formoterol (SYMBICORT) 160-4.5 MCG/ACT inhaler Inhale 2 puffs into the lungs TWICE DAILY (Patient taking differently: 2 puffs 2 (two) times daily.) 10.2 g 5   carvedilol (COREG) 12.5 MG tablet Take 1 tablet (12.5 mg total) by mouth 2 (two) times daily with a meal. 180 tablet 1   Cholecalciferol (VITAMIN D-3) 125 MCG (5000 UT) TABS Take 5,000 Units by mouth daily.     Cyanocobalamin (VITAMIN B-12) 1000 MCG SUBL Place 1 tablet (1,000 mcg total) under the tongue daily. (Patient taking differently: Place 1,000 mcg under the tongue daily.) 100 tablet 3   dexlansoprazole (DEXILANT) 60 MG capsule TAKE ONE CAPSULE BY MOUTH EVERY DAY Annual appt due in July must see provider for future refills 90 capsule 0   gabapentin (NEURONTIN) 300 MG capsule TAKE ONE CAPSULE BY MOUTH EVERY MORNING **May take a senod capsule during THE DAY AS NEEDED FOR PAIN 180 capsule 0   glucose blood (ACCU-CHEK GUIDE) test strip Use to check blood sugar once a day. 100 each 12   HUMIRA PEN 40 MG/0.4ML PNKT Inject 40 mg as directed every Wednesday.      hydrOXYzine (ATARAX) 25 MG tablet Take 1 tablet (25 mg total) by mouth every 8 (eight) hours as needed. (Patient taking differently: Take 6.25-12.5 mg by mouth See admin instructions. Takes 6.25 mg in the morning and 12.5 at night) 60  tablet 3   metFORMIN (GLUCOPHAGE-XR) 500 MG 24 hr tablet Take 2 tablets (1,000 mg total) by mouth at bedtime. Annual appt due in July must see provider for future refills 180 tablet 0   Multiple Vitamins-Minerals (MULTIVITAMIN WOMEN 50+) TABS Take 1 tablet by mouth daily.     naloxone (NARCAN) nasal spray 4 mg/0.1 mL Place 1 spray into the nose once as needed (overdose).     naltrexone (DEPADE) 50 MG tablet Take 12.5 mg by mouth at bedtime.      neomycin-polymyxin b-dexamethasone (MAXITROL) 3.5-10000-0.1 SUSP Place 1 drop into both eyes in the morning, at noon, in the evening, and at bedtime.     neomycin-polymyxin-hydrocortisone (CORTISPORIN) OTIC solution Apply 1 drop to the left hallux at the area of soreness and pain at the toenail once daily until symptoms have resolved 10 mL 0   Omega-3 Fatty Acids (FISH OIL) 1000 MG CAPS Take 1,000 mg by mouth daily.     OVER THE COUNTER MEDICATION Take 10 mLs by mouth 2 (two) times daily. Makes her own sea moss gel     oxyCODONE-acetaminophen (PERCOCET) 10-325 MG tablet 1 tablet as needed Orally every 6 hrs     OXYGEN Inhale 2 L into the lungs continuous.     polyvinyl alcohol (LIQUIFILM TEARS) 1.4 % ophthalmic solution Place 1 drop into both eyes as needed for dry eyes. 15 mL 0  potassium chloride (KLOR-CON) 10 MEQ tablet Take 1 tablet (10 mEq total) by mouth daily. 90 tablet 3   topiramate (TOPAMAX) 50 MG tablet Take 1 tablet (50 mg total) by mouth 2 (two) times daily. 180 tablet 1   venlafaxine XR (EFFEXOR-XR) 75 MG 24 hr capsule Take 1 capsule (75 mg total) by mouth daily with breakfast. 30 capsule 11   XARELTO 20 MG TABS tablet Take 1 tablet (20 mg total) by mouth at bedtime. 90 tablet 2   clonazePAM (KLONOPIN) 0.5 MG tablet TAKE 1 OR 2 TABLETS BY MOUTH AT BEDTIME AS NEEDED FOR ANXIETY 180 tablet 1   No facility-administered medications prior to visit.    ROS: Review of Systems  Constitutional:  Negative for activity change, appetite change,  chills, fatigue and unexpected weight change.  HENT:  Negative for congestion, mouth sores and sinus pressure.   Eyes:  Negative for visual disturbance.  Respiratory:  Negative for cough and chest tightness.   Gastrointestinal:  Negative for abdominal pain and nausea.  Genitourinary:  Negative for difficulty urinating, frequency and vaginal pain.  Musculoskeletal:  Positive for arthralgias, back pain and gait problem.  Skin:  Negative for pallor and rash.  Neurological:  Negative for dizziness, tremors, weakness, numbness and headaches.  Psychiatric/Behavioral:  Positive for dysphoric mood and sleep disturbance. Negative for confusion and suicidal ideas. The patient is nervous/anxious.     Objective:  BP 140/90 (BP Location: Left Arm, Patient Position: Sitting, Cuff Size: Normal)   Pulse 77   Temp 98.6 F (37 C) (Oral)   Ht '5\' 7"'$  (1.702 m)   Wt 269 lb (122 kg)   LMP 05/19/2003   SpO2 90%   BMI 42.13 kg/m   BP Readings from Last 3 Encounters:  07/02/21 140/90  04/23/21 110/60  03/24/21 (!) 157/73    Wt Readings from Last 3 Encounters:  07/02/21 269 lb (122 kg)  04/23/21 269 lb 3.2 oz (122.1 kg)  03/23/21 262 lb (118.8 kg)    Physical Exam Constitutional:      General: She is not in acute distress.    Appearance: She is well-developed. She is obese.  HENT:     Head: Normocephalic.     Right Ear: External ear normal.     Left Ear: External ear normal.     Nose: Nose normal.  Eyes:     General:        Right eye: No discharge.        Left eye: No discharge.     Conjunctiva/sclera: Conjunctivae normal.     Pupils: Pupils are equal, round, and reactive to light.  Neck:     Thyroid: No thyromegaly.     Vascular: No JVD.     Trachea: No tracheal deviation.  Cardiovascular:     Rate and Rhythm: Normal rate and regular rhythm.     Heart sounds: Normal heart sounds.  Pulmonary:     Effort: No respiratory distress.     Breath sounds: No stridor. No wheezing.   Abdominal:     General: Bowel sounds are normal. There is no distension.     Palpations: Abdomen is soft. There is no mass.     Tenderness: There is no abdominal tenderness. There is no guarding or rebound.  Musculoskeletal:        General: Tenderness present.     Cervical back: Normal range of motion and neck supple. No rigidity.  Lymphadenopathy:     Cervical: No cervical adenopathy.  Skin:    Findings: No erythema or rash.  Neurological:     Mental Status: She is oriented to person, place, and time.     Cranial Nerves: No cranial nerve deficit.     Motor: No abnormal muscle tone.     Coordination: Coordination abnormal.     Gait: Gait abnormal.     Deep Tendon Reflexes: Reflexes normal.  Psychiatric:        Behavior: Behavior normal.        Thought Content: Thought content normal.        Judgment: Judgment normal.   Using a walker On O2 LS w/pain  Lab Results  Component Value Date   WBC 9.1 03/24/2021   HGB 10.9 (L) 03/24/2021   HCT 35.0 (L) 03/24/2021   PLT 183 03/24/2021   GLUCOSE 123 (H) 03/24/2021   CHOL 210 (H) 07/19/2020   TRIG 109.0 07/19/2020   HDL 77.20 07/19/2020   LDLDIRECT 107.0 06/30/2017   LDLCALC 111 (H) 07/19/2020   ALT 13 07/19/2020   AST 18 07/19/2020   NA 141 03/24/2021   K 4.3 03/24/2021   CL 109 03/24/2021   CREATININE 1.06 (H) 03/24/2021   BUN 24 (H) 03/24/2021   CO2 26 03/24/2021   TSH 2.50 07/19/2020   INR 1.00 02/11/2018   HGBA1C 5.1 03/21/2021   MICROALBUR <0.7 02/01/2015    No results found.  Assessment & Plan:   Problem List Items Addressed This Visit     Anemia - Primary    Check CBC, iron      Relevant Orders   CBC with Differential/Platelet   Iron, TIBC and Ferritin Panel   Anxiety disorder    Worse: severe anxiety and insomnia - worse (off Clonazepam x 2 months due to her [facility] requirement). Start on Seroquel. Cont on Effexor.  Potential benefits of a long term Seroquel use as well as potential risks  and complications (i.e. somnolence, constipation and others) were explained to the patient and were aknowledged.      Essential hypertension    Cont on Coreg, Lasix      Relevant Orders   Comprehensive metabolic panel   TSH   GERD (gastroesophageal reflux disease)    Cont on Dexilant      Major depressive disorder, recurrent episode ([place])    Worse: severe anxiety and insomnia - worse (off Clonazepam x 2 months due to her [facility] requirement). Start on Seroquel.   Potential benefits of a long term Seroquel use as well as potential risks and complications (i.e. somnolence, constipation and others) were explained to the patient and were aknowledged.       Panic attacks    Worse: severe anxiety and insomnia - worse (off Clonazepam x 2 months due to her [facility] requirement). Start on Seroquel.   Potential benefits of a long term Seroquel use as well as potential risks and complications (i.e. somnolence, constipation and others) were explained to the patient and were aknowledged.      Relevant Orders   TSH      Meds ordered this encounter  Medications   DISCONTD: QUEtiapine (SEROQUEL) 25 MG tablet    Sig: Take 1 in am and 2 at hs    Dispense:  90 tablet    Refill:  3   QUEtiapine (SEROQUEL) 25 MG tablet    Sig: Take 1 in am and 2 at hs    Dispense:  90 tablet    Refill:  3      Follow-up: Return in about 6 weeks (around 08/13/2021) for a follow-up visit.  Walker Kehr, MD

## 2021-07-02 NOTE — Assessment & Plan Note (Signed)
Cont on Dexilant

## 2021-07-02 NOTE — Assessment & Plan Note (Signed)
Worse: severe anxiety and insomnia - worse (off Clonazepam x 2 months due to her [facility] requirement). Start on Seroquel. Cont on Effexor.  Potential benefits of a long term Seroquel use as well as potential risks and complications (i.e. somnolence, constipation and others) were explained to the patient and were aknowledged.

## 2021-07-02 NOTE — Assessment & Plan Note (Signed)
Worse: severe anxiety and insomnia - worse (off Clonazepam x 2 months due to her [facility] requirement). Start on Seroquel.   Potential benefits of a long term Seroquel use as well as potential risks and complications (i.e. somnolence, constipation and others) were explained to the patient and were aknowledged.

## 2021-07-02 NOTE — Assessment & Plan Note (Signed)
Check CBC, iron 

## 2021-07-03 LAB — IRON,TIBC AND FERRITIN PANEL
%SAT: 49 % (calc) — ABNORMAL HIGH (ref 16–45)
Ferritin: 14 ng/mL — ABNORMAL LOW (ref 16–232)
Iron: 204 ug/dL — ABNORMAL HIGH (ref 45–160)
TIBC: 416 mcg/dL (calc) (ref 250–450)

## 2021-07-06 NOTE — Unmapped (Signed)
[facility] Refill Coordination Note    Specialty Medication(s) to be Shipped:   Inflammatory Disorders: Humira    Other medication(s) to be shipped: No additional medications requested for fill at this time     Carolyn Newton, DOB: May 23, 1960  Phone: [phone] (home)       All above HIPAA information was verified with patient.     Was a Nurse, learning disability used for this call? No    Completed refill call assessment today to schedule patient's medication shipment from the [facility] [phone]).  All relevant notes have been reviewed.     Specialty medication(s) and dose(s) confirmed: Regimen is correct and unchanged.   Changes to medications: Klara reports stopping the following medications: Clonazepam. Started taking Seroquel  Changes to insurance: No  New side effects reported not previously addressed with a pharmacist or physician: None reported  Questions for the pharmacist: No    Confirmed patient received a Conservation officer, historic buildings and a Surveyor, mining with first shipment. The patient will receive a drug information handout for each medication shipped and additional FDA Medication Guides as required.       DISEASE/MEDICATION-SPECIFIC INFORMATION        For patients on injectable medications: Patient currently has 3 doses left.  Next injection is scheduled for 07/11/2021.    SPECIALTY MEDICATION ADHERENCE     Medication Adherence    Patient reported X missed doses in the last month: 0  Specialty Medication: Humira  Patient is on additional specialty medications: No  Any gaps in refill history greater than 2 weeks in the last 3 months: no  Demonstrates understanding of importance of adherence: yes  Informant: patient  Reliability of informant: reliable  Confirmed plan for next specialty medication refill: delivery by pharmacy  Refills needed for supportive medications: not needed        Were doses missed due to medication being on hold? No    REFERRAL TO PHARMACIST Referral to the pharmacist: Not needed      [facility]     Shipping address confirmed in Epic.     Delivery Scheduled: Yes, Expected medication delivery date: 07/12/2021.     Medication will be delivered via UPS to the prescription address in Epic WAM.    Tiera Mensinger D Betzayda Braxton   [facility] Specialty Technician

## 2021-07-07 DIAGNOSIS — J449 Chronic obstructive pulmonary disease, unspecified: Secondary | ICD-10-CM | POA: Diagnosis not present

## 2021-07-09 DIAGNOSIS — M47817 Spondylosis without myelopathy or radiculopathy, lumbosacral region: Secondary | ICD-10-CM | POA: Diagnosis not present

## 2021-07-11 DIAGNOSIS — M47816 Spondylosis without myelopathy or radiculopathy, lumbar region: Secondary | ICD-10-CM | POA: Diagnosis not present

## 2021-07-11 MED FILL — HUMIRA PEN CITRATE FREE 40 MG/0.4 ML: SUBCUTANEOUS | 28 days supply | Qty: 4 | Fill #2

## 2021-07-16 ENCOUNTER — Other Ambulatory Visit: Payer: Self-pay | Admitting: Internal Medicine

## 2021-07-16 DIAGNOSIS — Z1231 Encounter for screening mammogram for malignant neoplasm of breast: Secondary | ICD-10-CM

## 2021-07-20 ENCOUNTER — Telehealth: Payer: Self-pay | Admitting: Orthopedic Surgery

## 2021-07-20 ENCOUNTER — Other Ambulatory Visit: Payer: Self-pay | Admitting: *Deleted

## 2021-07-20 NOTE — Telephone Encounter (Signed)
Patient recently fell and hurt her left knee, offered her the next available with Dr. Marlou Sa and Lurena Joiner and she wanted to be seen sooner or with another doctor is possible. CB # [phone]

## 2021-07-20 NOTE — Telephone Encounter (Signed)
Patient fell recently and hurt her left knee. She is a patient of Dr. Forbes Cellar but when offered his or Runell Gess next available she wanted to be seen sooner. CB # [phone]

## 2021-07-20 NOTE — Patient Outreach (Signed)
[facility]) Care Management  07/20/2021  [name] [facility] 02/18/1960 [id number]   Per patient her breathing is good. She stated she only has to cut up her oxygen when she gets anxious. Per patient she fell over her oxygen tubing on the fourth of July. Her knee is sore and her son is going to take her to the Dr on Sat.    Plan: Case closure Closure letter sent to PCP Closure letter sent to Patient Certificate of completion.   [facility] [phone]

## 2021-07-23 NOTE — Telephone Encounter (Signed)
IC work in appt scheduled.

## 2021-07-25 ENCOUNTER — Ambulatory Visit (INDEPENDENT_AMBULATORY_CARE_PROVIDER_SITE_OTHER): Payer: Medicare Other | Admitting: Surgical

## 2021-07-25 ENCOUNTER — Ambulatory Visit (INDEPENDENT_AMBULATORY_CARE_PROVIDER_SITE_OTHER): Payer: Medicare Other

## 2021-07-25 ENCOUNTER — Other Ambulatory Visit: Payer: Self-pay | Admitting: Internal Medicine

## 2021-07-25 ENCOUNTER — Other Ambulatory Visit: Payer: Self-pay | Admitting: Pulmonary Disease

## 2021-07-25 DIAGNOSIS — M25562 Pain in left knee: Secondary | ICD-10-CM

## 2021-07-25 DIAGNOSIS — S72432A Displaced fracture of medial condyle of left femur, initial encounter for closed fracture: Secondary | ICD-10-CM | POA: Diagnosis not present

## 2021-07-25 NOTE — Progress Notes (Signed)
L knee

## 2021-07-26 ENCOUNTER — Encounter: Payer: Self-pay | Admitting: Orthopedic Surgery

## 2021-07-27 ENCOUNTER — Other Ambulatory Visit: Payer: Self-pay

## 2021-07-27 ENCOUNTER — Encounter: Payer: Self-pay | Admitting: Orthopedic Surgery

## 2021-07-27 ENCOUNTER — Ambulatory Visit
Admission: RE | Admit: 2021-07-27 | Discharge: 2021-07-27 | Disposition: A | Discharge status: Home / Self Care | Payer: Medicare Other | Source: Ambulatory Visit | Attending: Surgical | Admitting: Surgical

## 2021-07-27 DIAGNOSIS — M25562 Pain in left knee: Secondary | ICD-10-CM

## 2021-07-27 DIAGNOSIS — M7989 Other specified soft tissue disorders: Secondary | ICD-10-CM | POA: Diagnosis not present

## 2021-07-27 DIAGNOSIS — S72435A Nondisplaced fracture of medial condyle of left femur, initial encounter for closed fracture: Secondary | ICD-10-CM | POA: Diagnosis not present

## 2021-07-27 NOTE — Progress Notes (Signed)
Hey Dr. Marlou Sa, here is the CT scan of the left knee medial femoral condyle fracture

## 2021-07-27 NOTE — Progress Notes (Signed)
Plan for or tuesday

## 2021-07-28 NOTE — Progress Notes (Signed)
You called right

## 2021-07-29 ENCOUNTER — Encounter: Payer: Self-pay | Admitting: Orthopedic Surgery

## 2021-07-29 NOTE — Progress Notes (Signed)
Office Visit Note   Patient: [name]           Date of Birth: 26-Aug-1960           MRN: [id number] Visit Date: 07/25/2021 Requested by: Cassandria Anger, MD [place],  [place] [address] PCP: Cassandria Anger, MD  Subjective: Chief Complaint  Patient presents with   Left Leg - Pain    HPI: [name] is a 61 y.o. female who presents to the office complaining of left knee pain.  Patient states that she has fallen twice onto her left knee recently with the first fall on 07/17/21 and another fall couple days after that.  Since the first fall she has had severe increase in her left knee pain.  Localizes the majority of her pain to the medial aspect of the knee.  She has noticed increased swelling but not much bruising.  She has been avoiding weightbearing as that severely worsens her pain.  Getting around with rollator.  Denies any groin pain or any other joints bothering her.  She has history of knee replacement of the left knee.  She takes Xarelto 20 mg at bedtime for history of bilateral DVT and history of PE.              ROS: All systems reviewed are negative as they relate to the chief complaint within the history of present illness.  Patient denies fevers or chills.  Assessment & Plan: Visit Diagnoses:  1. Left knee pain, unspecified chronicity     Plan: Patient is a 61 year old female who presents for evaluation of left knee pain.  She has had left knee pain since a fall on 07/17/2021.  She has had severe difficulty weightbearing since this fall and had another fall shortly after.  She has radiographs taken today that demonstrate periprosthetic fracture of the medial femoral condyle around her femoral component of left total knee arthroplasty.  Plan for further evaluation of this fracture with CT scan of the left knee.  She was told to be nonweightbearing is much as possible.  Given a knee immobilizer today.  Update 07/29/2021: Patient  was contacted on 07/27/2021 regarding her CT scan results.  After discussion with Dr. Marlou Sa, plan for surgical intervention with fixation of medial femoral condyle fracture on 07/31/2021.  Called [facility] on 07/29/2021 and recommended she discontinue her Xarelto.  She last took this on the evening of 7/15.  Discussed the risks and benefits of the procedure as well as recovery timeframe.  She will follow-up with the office after procedure.  Anticipate stay in skilled nursing facility which she required after her i knee replacements.  She does not really have any help at home and lives at home alone.  Follow-Up Instructions: No follow-ups on file.   Orders:  Orders Placed This Encounter  Procedures   XR KNEE 3 VIEW LEFT   No orders of the defined types were placed in this encounter.     Procedures: No procedures performed   Clinical Data: No additional findings.  Objective: Vital Signs: LMP 05/19/2003   Physical Exam:  Constitutional: Patient appears well-developed HEENT:  Head: Normocephalic Eyes:EOM are normal Neck: Normal range of motion Cardiovascular: Normal rate Pulmonary/chest: Effort normal Neurologic: Patient is alert Skin: Skin is warm Psychiatric: Patient has normal mood and affect  Ortho Exam: Ortho exam demonstrates left knee with trace effusion.  No tenderness along the lateral joint line.  She has severe tenderness with  swelling noted primarily around the medial femoral condyle.  No calf tenderness.  Negative Homans' sign.  She has difficulty performing straight leg raise due to pain though she can perform about 1 before she refuses to do another.  No pain with hip range of motion on exam today.  Mild ecchymosis noted in the anterior medial aspect of the knee.  No skin injury or blisters noted.  Specialty Comments:  No specialty comments available.  Imaging: No results found.   PMFS History: Patient Active Problem List   Diagnosis Date Noted   Nasolacrimal duct  obstruction, acquired, bilateral 03/23/2021   Chronic pain disorder 03/23/2021   Dacrocystitis, left 11/20/2020   Cellulitis, face 11/20/2020   Well adult exam 07/19/2020   Benign neoplasm of ascending colon    Benign neoplasm of descending colon    Benign neoplasm of sigmoid colon    Chronic diarrhea    Diarrhea 07/06/2019   Shoulder pain, bilateral 02/01/2019   Physical deconditioning 01/28/2019   Chronic anticoagulation 11/19/2018   Insulin dependent diabetes mellitus type IA ([facility]) 11/19/2018   Oxygen dependent 11/19/2018   Family history of colon cancer 10/26/2018   Myringotomy tube status 08/21/2018   Vaginal candidiasis 07/15/2018   Nasal crusting 06/12/2018   Chronic serous otitis media of left ear 06/04/2018   Mixed conductive and sensorineural hearing loss of left ear with restricted hearing of right ear 06/04/2018   Chronic cholecystitis with calculus 02/11/2018   Thrush, oral 01/15/2018   Nasal sinus congestion 01/15/2018   Dyslipidemia 10/28/2017   Pruritus 10/23/2017   Abnormal liver function test    Anemia 09/24/2017   Migraines 09/17/2017   Cholecystitis, acute 09/12/2017   COPD (chronic obstructive pulmonary disease) ([facility]) 13/08/6576   Diastolic dysfunction 46/96/2952   OSA (obstructive sleep apnea) 09/30/2016   Chronic respiratory failure with hypoxia ([facility]) 08/23/2016   Pulmonary embolism ([facility]) 07/21/2016   DVT (deep venous thrombosis) ([facility]) 07/21/2016   Acute bronchitis 01/30/2016   Weight gain 12/20/2015   Controlled type 2 diabetes mellitus with chronic kidney disease, without long-term current use of insulin ([facility]) 02/01/2015   Abnormal SPEP 06/04/2013   Memory loss 04/27/2013   Panic attacks 03/17/2013   Rash 10/27/2012   Pulmonary nodules 07/20/2012   CAD in native artery 05/28/2012   Seborrheic dermatitis 01/20/2012   Obesity, Class III, BMI 40-49.9 (morbid obesity) ([facility]) 03/07/2010   INSOMNIA, CHRONIC 03/07/2010   Allergic rhinitis 10/26/2009    Grief reaction 08/01/2009   Essential hypertension 08/27/2007   B12 deficiency 08/25/2007   Vitamin D deficiency 08/25/2007   Urinary tract infection 02/07/2007   SINUSITIS, ACUTE 02/02/2007   Osteoarthritis 02/02/2007   Sarcoidosis 08/14/2006   Anxiety disorder 08/14/2006   Major depressive disorder, recurrent episode ([facility]) 08/14/2006   GERD (gastroesophageal reflux disease) 08/14/2006   Past Medical History:  Diagnosis Date   ALLERGIC RHINITIS 10/26/2009   no current problems   B12 DEFICIENCY 08/25/2007   takes B12 supplement   Complication of anesthesia    pt has had difficulty following anesthesia with her knee in 2016-unable to care for herself afterward   Depression    takes effexor xr daily   Diabetes mellitus without complication ([facility])    was on insulin but has been off since Nov 2015 and now only takes Metformin daily   DYSPNEA 04/28/2009   uses oxygen 24/7, on 2L via [place]   Esophageal reflux    takes Nexium daily   Fibromyalgia    Headache  last migraine 2-52yr ago;takes Topamax daily   History of shingles    Hypertension    takes Coreg daily   Insomnia    takes Nortriptyline nightly    Long-term memory loss    OSA (obstructive sleep apnea)    doesn't use CPAP;sleep study in epic from 2006   Osteoarthritis    PONV (postoperative nausea and vomiting)    on time in 2016 knee surgery   Rheumatoid arthritis (HCC)    Sarcoidosis    Dr. ZLolita Patella  Stroke ([facility]    Vitamin D deficiency    is supposed to take Vit D but can't afford it    Family History  Problem Relation Age of Onset   Heart disease Mother    Kidney disease Mother        renal failure   Cancer Father        leukemia   Hypertension Other    Coronary artery disease Other        female 1st degree relative <60   Heart failure Other        congestive   Coronary artery disease Other        Female 1st degree relative <50   Breast cancer Other        1st degree relative <50 S   Breast cancer  Sister    Colon cancer Brother 552  Stroke Brother    Heart attack Brother    Cancer Brother 557      colon ca   Esophageal cancer Neg Hx    Stomach cancer Neg Hx    Rectal cancer Neg Hx     Past Surgical History:  Procedure Laterality Date   APPENDECTOMY     arthroscopic knee surgery Right 11-12-04   AXILLARY ABCESS IRRIGATION AND DEBRIDEMENT  Jul & Aug2012   BIOPSY  11/02/2019   Procedure: BIOPSY;  Surgeon: PJerene Bears MD;  [facility];  Service: Gastroenterology;;   CARPAL TUNNEL RELEASE Left 05/23/2014   Procedure: CARPAL TUNNEL RELEASE;  Surgeon: SMeredith Pel MD;  [facility]  Service: Orthopedics;  Laterality: Left;   CHOLECYSTECTOMY N/A 02/11/2018   Procedure: LAPAROSCOPIC CHOLECYSTECTOMY WITH INTRAOPERATIVE CHOLANGIOGRAM ERAS PATHWAY;  Surgeon: TDonnie Mesa MD;  [facility]  Service: General;  Laterality: N/A;   COLONOSCOPY WITH PROPOFOL N/A 11/02/2019   Procedure: COLONOSCOPY WITH PROPOFOL;  Surgeon: PJerene Bears MD;  [facility];  Service: Gastroenterology;  Laterality: N/A;   cyst removed from top of buttocks  at age 61  DACRORHINOCYSTOTOMY Bilateral 03/23/2021   Procedure: ENDOSCOPIC DACROCYSTORHINOSTOMY;  Surgeon: LDelia Chimes MD;  [facility]  Service: Ophthalmology;  Laterality: Bilateral;   ENDOMETRIAL ABLATION     IR CHOLANGIOGRAM EXISTING TUBE  11/21/2017   IR CHOLANGIOGRAM EXISTING TUBE  01/16/2018   IR EXCHANGE BILIARY DRAIN  11/10/2017   IR PATIENT EVAL TECH 0-60 MINS  02/03/2018   IR PERC CHOLECYSTOSTOMY  09/13/2017   IR RADIOLOGIST EVAL & MGMT  10/08/2017   LACRIMAL DUCT EXPLORATION Right 06/26/2017   Procedure: LACRIMAL DUCT EXPLORATION AND ETHMOIDECTOMY;  Surgeon: AClista Bernhardt MD;  [facility]  Service: Ophthalmology;  Laterality: Right;   LACRIMAL DUCT EXPLORATION Bilateral 03/23/2021   Procedure: NASAL LACRIMAL DUCT EXPLORATION;  Surgeon: LDelia Chimes MD;  [facility]  Service:  Ophthalmology;  Laterality: Bilateral;   LACRIMAL TUBE INSERTION Bilateral 03/23/2021   Procedure: INSERTION OF CRAWFORD LACRIMAL STENT;  Surgeon: LDelia Chimes MD;  Location: [facility];  Service: Ophthalmology;  Laterality: Bilateral;   POLYPECTOMY  11/02/2019   Procedure: POLYPECTOMY;  Surgeon: Jerene Bears, MD;  [facility];  Service: Gastroenterology;;   TEAR DUCT PROBING Right 06/26/2017   Procedure: TEAR DUCT PROBING WITH STENT;  Surgeon: Clista Bernhardt, MD;  Location: [facility];  Service: Ophthalmology;  Laterality: Right;   TOTAL KNEE ARTHROPLASTY Right 11/15/2014   Procedure: TOTAL RIGHT KNEE ARTHROPLASTY;  Surgeon: Meredith Pel, MD;  Location: [facility];  Service: Orthopedics;  Laterality: Right;   TOTAL KNEE ARTHROPLASTY Left 07/13/2015   Procedure: LEFT TOTAL KNEE ARTHROPLASTY;  Surgeon: Meredith Pel, MD;  Location: [facility];  Service: Orthopedics;  Laterality: Left;   Social History   Occupational History   Occupation: disabled  Tobacco Use   Smoking status: Former    Packs/day: 0.50    Years: 10.00    Total pack years: 5.00    Types: Cigarettes    Start date: 1988    Quit date: 2004    Years since quitting: 19.5   Smokeless tobacco: Never   Tobacco comments:    quit smoking in 2004  Vaping Use   Vaping Use: Never used  Substance and Sexual Activity   Alcohol use: Not Currently    Alcohol/week: 1.0 standard drink of alcohol    Types: 1 Glasses of wine per week   Drug use: No   Sexual activity: Not Currently    Birth control/protection: Post-menopausal

## 2021-07-29 NOTE — Progress Notes (Signed)
Yes I called and discussed that surgery would be Tuesday

## 2021-07-30 ENCOUNTER — Other Ambulatory Visit: Payer: Self-pay | Admitting: Internal Medicine

## 2021-07-30 ENCOUNTER — Encounter (HOSPITAL_COMMUNITY): Payer: Self-pay | Admitting: Orthopedic Surgery

## 2021-07-30 ENCOUNTER — Other Ambulatory Visit: Payer: Self-pay

## 2021-07-30 ENCOUNTER — Telehealth: Payer: Self-pay | Admitting: Orthopedic Surgery

## 2021-07-30 ENCOUNTER — Ambulatory Visit: Payer: Medicare Other | Admitting: Orthopedic Surgery

## 2021-07-30 NOTE — Pre-Procedure Instructions (Signed)
[facility] [address], [place] [address] Phone: [phone] Fax: [place], [place] [address] [place] [address] Phone: [phone] Fax: [phone]  PCP - Cassandria Anger, MD Cardiologist - Josue Hector, MD  Chest x-ray - 02/01/21 EKG - 03/21/21 ECHO - 11/21/16  CPAP - Does not wear  Checks Blood Sugar periodically   Blood Thinner Instructions: Xarelto last dose: 07/28/21  ERAS Protcol - Clears until 1245  Anesthesia review: Y  Patient verbally denies any shortness of breath, fever, cough and chest pain during phone call   -------------  SDW INSTRUCTIONS given:  Your procedure is scheduled on 07/31/21.  Report to [facility] Entrance "A" at 1:15 P.M., and check in at the Admitting office.  Call this number if you have problems the morning of surgery:  [phone]   Remember:  Do not eat after midnight the night before your surgery  You may drink clear liquids until 1245 the morning of your surgery.   Clear liquids allowed are: Water, Non-Citrus Juices (without pulp), Carbonated Beverages, Clear Tea, Black Coffee Only, and Gatorade    Take these medicines the morning of surgery with A SIP OF WATER  budesonide-formoterol (SYMBICORT) carvedilol (COREG)  dexlansoprazole (DEXILANT) gabapentin (NEURONTIN) neomycin-polymyxin b-dexamethasone (MAXITROL)  QUEtiapine (SEROQUEL)  topiramate (TOPAMAX) venlafaxine XR (EFFEXOR-XR)  albuterol (VENTOLIN HFA)-if needed (please bring on day of surgery) hydrOXYzine (ATARAX)-if needed oxyCODONE-acetaminophen (PERCOCET)-if needed  .** PLEASE check your blood sugar the morning of your surgery when you wake up and every 2 hours until you get to the Short Stay unit.  If your blood sugar is less than 70 mg/dL, you will need to treat  for low blood sugar: Do not take insulin. Treat a low blood sugar (less than 70 mg/dL) with  cup of clear juice (cranberry or apple), 4 glucose tablets, OR glucose gel. Recheck blood sugar in 15 minutes after treatment (to make sure it is greater than 70 mg/dL). If your blood sugar is not greater than 70 mg/dL on recheck, call [phone] for further instructions.  As of today, STOP taking any Aspirin (unless otherwise instructed by your surgeon) Aleve, Naproxen, Ibuprofen, Motrin, Advil, Goody's, BC's, all herbal medications, fish oil, and all vitamins.                      Do not wear jewelry, make up, or nail polish            Do not wear lotions, powders, perfumes/colognes, or deodorant.            Do not shave 48 hours prior to surgery.  Men may shave face and neck.            Do not bring valuables to the hospital.            [facility] is not responsible for any belongings or valuables.  Do NOT Smoke (Tobacco/Vaping) 24 hours prior to your procedure If you use a CPAP at night, you may bring all equipment for your overnight stay.   Contacts, glasses, dentures or bridgework may not be worn into surgery.      For patients admitted to the hospital, discharge time will be determined by your treatment team.   Patients discharged the day of surgery will not  be allowed to drive home, and someone needs to stay with them for 24 hours.    Special instructions:   [facility]- Preparing For Surgery  Before surgery, you can play an important role. Because skin is not sterile, your skin needs to be as free of germs as possible. You can reduce the number of germs on your skin by washing with CHG (chlorahexidine gluconate) Soap before surgery.  CHG is an antiseptic cleaner which kills germs and bonds with the skin to continue killing germs even after washing.    Oral Hygiene is also important to reduce your risk of infection.  Remember - BRUSH YOUR TEETH THE MORNING OF SURGERY WITH YOUR REGULAR  TOOTHPASTE  Please do not use if you have an allergy to CHG or antibacterial soaps. If your skin becomes reddened/irritated stop using the CHG.  Do not shave (including legs and underarms) for at least 48 hours prior to first CHG shower. It is OK to shave your face.  Please follow these instructions carefully.   Shower the NIGHT BEFORE SURGERY and the MORNING OF SURGERY with DIAL Soap.   Pat yourself dry with a CLEAN TOWEL.  Wear CLEAN PAJAMAS to bed the night before surgery  Place CLEAN SHEETS on your bed the night of your first shower and DO NOT SLEEP WITH PETS.   Day of Surgery: Please shower morning of surgery  Wear Clean/Comfortable clothing the morning of surgery Do not apply any deodorants/lotions.   Remember to brush your teeth WITH YOUR REGULAR TOOTHPASTE.   Questions were answered. Patient verbalized understanding of instructions.

## 2021-07-30 NOTE — Telephone Encounter (Signed)
Unlikely for appts this week - will be nwb for 4 weeks at least

## 2021-07-30 NOTE — Telephone Encounter (Signed)
Patient is calling to ask if Dr. Marlou Sa will be prescribing her pain medication.  She is scheduled tomorrow 07-31-21 for left medial femoral condyle fracture fixation.  She states she is under pain management also, but wants Dr. Marlou Sa to prescribe.  Patient is also asking if she will be able to go to her other upcoming appointments for next week. Looks like she has 3 appointments in [place].   She has not been told how long she would be in the hospital or in rehab.  Please call patient to advise at [phone]

## 2021-07-31 ENCOUNTER — Other Ambulatory Visit: Payer: Self-pay

## 2021-07-31 ENCOUNTER — Inpatient Hospital Stay (HOSPITAL_COMMUNITY)
Admission: RE | Admit: 2021-07-31 | Discharge: 2021-08-06 | DRG: 481 | Disposition: A | Discharge status: Skilled Nursing Facility | Payer: Medicare Other | Attending: Orthopedic Surgery | Admitting: Orthopedic Surgery

## 2021-07-31 ENCOUNTER — Inpatient Hospital Stay (HOSPITAL_COMMUNITY): Payer: Medicare Other | Admitting: Physician Assistant

## 2021-07-31 ENCOUNTER — Encounter (HOSPITAL_COMMUNITY)
Admission: RE | Disposition: A | Discharge status: Skilled Nursing Facility | Payer: Self-pay | Source: Home / Self Care | Attending: Orthopedic Surgery

## 2021-07-31 ENCOUNTER — Inpatient Hospital Stay (HOSPITAL_COMMUNITY): Payer: Medicare Other

## 2021-07-31 DIAGNOSIS — Z806 Family history of leukemia: Secondary | ICD-10-CM

## 2021-07-31 DIAGNOSIS — Z79899 Other long term (current) drug therapy: Secondary | ICD-10-CM

## 2021-07-31 DIAGNOSIS — Z6841 Body Mass Index (BMI) 40.0 and over, adult: Secondary | ICD-10-CM | POA: Diagnosis not present

## 2021-07-31 DIAGNOSIS — Z87891 Personal history of nicotine dependence: Secondary | ICD-10-CM

## 2021-07-31 DIAGNOSIS — Z823 Family history of stroke: Secondary | ICD-10-CM | POA: Diagnosis not present

## 2021-07-31 DIAGNOSIS — Z91040 Latex allergy status: Secondary | ICD-10-CM | POA: Diagnosis not present

## 2021-07-31 DIAGNOSIS — J449 Chronic obstructive pulmonary disease, unspecified: Secondary | ICD-10-CM | POA: Diagnosis not present

## 2021-07-31 DIAGNOSIS — M9712XA Periprosthetic fracture around internal prosthetic left knee joint, initial encounter: Secondary | ICD-10-CM | POA: Diagnosis not present

## 2021-07-31 DIAGNOSIS — Z888 Allergy status to other drugs, medicaments and biological substances status: Secondary | ICD-10-CM

## 2021-07-31 DIAGNOSIS — R1311 Dysphagia, oral phase: Secondary | ICD-10-CM | POA: Diagnosis not present

## 2021-07-31 DIAGNOSIS — M797 Fibromyalgia: Secondary | ICD-10-CM | POA: Diagnosis present

## 2021-07-31 DIAGNOSIS — Z8673 Personal history of transient ischemic attack (TIA), and cerebral infarction without residual deficits: Secondary | ICD-10-CM

## 2021-07-31 DIAGNOSIS — I10 Essential (primary) hypertension: Secondary | ICD-10-CM

## 2021-07-31 DIAGNOSIS — W19XXXA Unspecified fall, initial encounter: Secondary | ICD-10-CM | POA: Diagnosis present

## 2021-07-31 DIAGNOSIS — Z91048 Other nonmedicinal substance allergy status: Secondary | ICD-10-CM

## 2021-07-31 DIAGNOSIS — S72432A Displaced fracture of medial condyle of left femur, initial encounter for closed fracture: Secondary | ICD-10-CM | POA: Diagnosis not present

## 2021-07-31 DIAGNOSIS — M069 Rheumatoid arthritis, unspecified: Secondary | ICD-10-CM | POA: Diagnosis present

## 2021-07-31 DIAGNOSIS — I2510 Atherosclerotic heart disease of native coronary artery without angina pectoris: Secondary | ICD-10-CM

## 2021-07-31 DIAGNOSIS — Z800 Family history of malignant neoplasm of digestive organs: Secondary | ICD-10-CM

## 2021-07-31 DIAGNOSIS — M9702XA Periprosthetic fracture around internal prosthetic left hip joint, initial encounter: Secondary | ICD-10-CM | POA: Diagnosis not present

## 2021-07-31 DIAGNOSIS — Z885 Allergy status to narcotic agent status: Secondary | ICD-10-CM

## 2021-07-31 DIAGNOSIS — Z7901 Long term (current) use of anticoagulants: Secondary | ICD-10-CM

## 2021-07-31 DIAGNOSIS — Z4789 Encounter for other orthopedic aftercare: Secondary | ICD-10-CM | POA: Diagnosis not present

## 2021-07-31 DIAGNOSIS — Z881 Allergy status to other antibiotic agents status: Secondary | ICD-10-CM

## 2021-07-31 DIAGNOSIS — Z8249 Family history of ischemic heart disease and other diseases of the circulatory system: Secondary | ICD-10-CM

## 2021-07-31 DIAGNOSIS — F32A Depression, unspecified: Secondary | ICD-10-CM | POA: Diagnosis not present

## 2021-07-31 DIAGNOSIS — Z86718 Personal history of other venous thrombosis and embolism: Secondary | ICD-10-CM | POA: Diagnosis not present

## 2021-07-31 DIAGNOSIS — J9611 Chronic respiratory failure with hypoxia: Secondary | ICD-10-CM | POA: Diagnosis not present

## 2021-07-31 DIAGNOSIS — S72402A Unspecified fracture of lower end of left femur, initial encounter for closed fracture: Secondary | ICD-10-CM

## 2021-07-31 DIAGNOSIS — G8929 Other chronic pain: Secondary | ICD-10-CM | POA: Diagnosis not present

## 2021-07-31 DIAGNOSIS — G8918 Other acute postprocedural pain: Secondary | ICD-10-CM | POA: Diagnosis not present

## 2021-07-31 DIAGNOSIS — Z9889 Other specified postprocedural states: Secondary | ICD-10-CM | POA: Diagnosis not present

## 2021-07-31 DIAGNOSIS — M25462 Effusion, left knee: Secondary | ICD-10-CM | POA: Diagnosis not present

## 2021-07-31 DIAGNOSIS — Z803 Family history of malignant neoplasm of breast: Secondary | ICD-10-CM

## 2021-07-31 DIAGNOSIS — E119 Type 2 diabetes mellitus without complications: Secondary | ICD-10-CM | POA: Diagnosis not present

## 2021-07-31 DIAGNOSIS — Z01818 Encounter for other preprocedural examination: Secondary | ICD-10-CM | POA: Diagnosis not present

## 2021-07-31 DIAGNOSIS — E559 Vitamin D deficiency, unspecified: Secondary | ICD-10-CM | POA: Diagnosis present

## 2021-07-31 DIAGNOSIS — Z8619 Personal history of other infectious and parasitic diseases: Secondary | ICD-10-CM | POA: Diagnosis not present

## 2021-07-31 DIAGNOSIS — D869 Sarcoidosis, unspecified: Secondary | ICD-10-CM | POA: Diagnosis present

## 2021-07-31 DIAGNOSIS — Z7951 Long term (current) use of inhaled steroids: Secondary | ICD-10-CM

## 2021-07-31 DIAGNOSIS — Z7401 Bed confinement status: Secondary | ICD-10-CM | POA: Diagnosis not present

## 2021-07-31 DIAGNOSIS — M6281 Muscle weakness (generalized): Secondary | ICD-10-CM | POA: Diagnosis not present

## 2021-07-31 DIAGNOSIS — M6259 Muscle wasting and atrophy, not elsewhere classified, multiple sites: Secondary | ICD-10-CM | POA: Diagnosis not present

## 2021-07-31 DIAGNOSIS — R2681 Unsteadiness on feet: Secondary | ICD-10-CM | POA: Diagnosis not present

## 2021-07-31 DIAGNOSIS — Z7984 Long term (current) use of oral hypoglycemic drugs: Secondary | ICD-10-CM

## 2021-07-31 DIAGNOSIS — M7989 Other specified soft tissue disorders: Secondary | ICD-10-CM | POA: Diagnosis not present

## 2021-07-31 DIAGNOSIS — K219 Gastro-esophageal reflux disease without esophagitis: Secondary | ICD-10-CM | POA: Diagnosis present

## 2021-07-31 DIAGNOSIS — G43909 Migraine, unspecified, not intractable, without status migrainosus: Secondary | ICD-10-CM | POA: Diagnosis present

## 2021-07-31 DIAGNOSIS — Z841 Family history of disorders of kidney and ureter: Secondary | ICD-10-CM

## 2021-07-31 DIAGNOSIS — Z9109 Other allergy status, other than to drugs and biological substances: Secondary | ICD-10-CM

## 2021-07-31 DIAGNOSIS — Z741 Need for assistance with personal care: Secondary | ICD-10-CM | POA: Diagnosis not present

## 2021-07-31 DIAGNOSIS — Z96653 Presence of artificial knee joint, bilateral: Secondary | ICD-10-CM | POA: Diagnosis present

## 2021-07-31 DIAGNOSIS — S72432D Displaced fracture of medial condyle of left femur, subsequent encounter for closed fracture with routine healing: Secondary | ICD-10-CM | POA: Diagnosis not present

## 2021-07-31 DIAGNOSIS — R531 Weakness: Secondary | ICD-10-CM | POA: Diagnosis not present

## 2021-07-31 DIAGNOSIS — S72409A Unspecified fracture of lower end of unspecified femur, initial encounter for closed fracture: Secondary | ICD-10-CM | POA: Diagnosis present

## 2021-07-31 DIAGNOSIS — E1122 Type 2 diabetes mellitus with diabetic chronic kidney disease: Secondary | ICD-10-CM | POA: Diagnosis not present

## 2021-07-31 DIAGNOSIS — S72409S Unspecified fracture of lower end of unspecified femur, sequela: Secondary | ICD-10-CM | POA: Diagnosis not present

## 2021-07-31 DIAGNOSIS — G4700 Insomnia, unspecified: Secondary | ICD-10-CM | POA: Diagnosis present

## 2021-07-31 DIAGNOSIS — Z743 Need for continuous supervision: Secondary | ICD-10-CM | POA: Diagnosis not present

## 2021-07-31 HISTORY — DX: Panic disorder (episodic paroxysmal anxiety): F41.0

## 2021-07-31 HISTORY — PX: ORIF FEMUR FRACTURE: SHX2119

## 2021-07-31 LAB — CBC
HCT: 38.6 % (ref 36.0–46.0)
Hemoglobin: 12.3 g/dL (ref 12.0–15.0)
MCH: 31.7 pg (ref 26.0–34.0)
MCHC: 31.9 g/dL (ref 30.0–36.0)
MCV: 99.5 fL (ref 80.0–100.0)
Platelets: 198 10*3/uL (ref 150–400)
RBC: 3.88 MIL/uL (ref 3.87–5.11)
RDW: 15.9 % — ABNORMAL HIGH (ref 11.5–15.5)
WBC: 7.2 10*3/uL (ref 4.0–10.5)
nRBC: 0 % (ref 0.0–0.2)

## 2021-07-31 LAB — GLUCOSE, CAPILLARY
Glucose-Capillary: 113 mg/dL — ABNORMAL HIGH (ref 70–99)
Glucose-Capillary: 118 mg/dL — ABNORMAL HIGH (ref 70–99)
Glucose-Capillary: 118 mg/dL — ABNORMAL HIGH (ref 70–99)

## 2021-07-31 LAB — BASIC METABOLIC PANEL
Anion gap: 15 (ref 5–15)
BUN: 22 mg/dL (ref 8–23)
CO2: 22 mmol/L (ref 22–32)
Calcium: 9.4 mg/dL (ref 8.9–10.3)
Chloride: 107 mmol/L (ref 98–111)
Creatinine, Ser: 0.88 mg/dL (ref 0.44–1.00)
GFR, Estimated: >60 mL/min (ref >60)
Glucose, Bld: 115 mg/dL — ABNORMAL HIGH (ref 70–99)
Potassium: 3.9 mmol/L (ref 3.5–5.1)
Sodium: 144 mmol/L (ref 135–145)

## 2021-07-31 SURGERY — OPEN REDUCTION INTERNAL FIXATION (ORIF) DISTAL FEMUR FRACTURE
Anesthesia: General | Laterality: Left

## 2021-07-31 MED ORDER — METOCLOPRAMIDE HCL 5 MG/ML IJ SOLN: 5.0000 mg | Freq: Three times a day (TID) | INTRAMUSCULAR | Status: DC | PRN

## 2021-07-31 MED ORDER — DEXAMETHASONE SODIUM PHOSPHATE 10 MG/ML IJ SOLN
INTRAMUSCULAR | Status: DC | PRN
  Administered 2021-07-31: 4 mg via INTRAVENOUS

## 2021-07-31 MED ORDER — METHOCARBAMOL 500 MG PO TABS
500.0000 mg | ORAL_TABLET | Freq: Four times a day (QID) | ORAL | Status: DC | PRN
  Administered 2021-07-31 – 2021-08-03 (×7): 500 mg via ORAL
  Filled 2021-07-31 (×7): qty 1

## 2021-07-31 MED ORDER — CEFAZOLIN SODIUM-DEXTROSE 2-4 GM/100ML-% IV SOLN
2.0000 g | INTRAVENOUS | Status: AC
  Administered 2021-07-31: 2 g via INTRAVENOUS
  Filled 2021-07-31: qty 100

## 2021-07-31 MED ORDER — TOPIRAMATE 25 MG PO TABS
50.0000 mg | ORAL_TABLET | Freq: Two times a day (BID) | ORAL | Status: DC
  Administered 2021-08-01 – 2021-08-06 (×11): 50 mg via ORAL
  Filled 2021-07-31 (×12): qty 2

## 2021-07-31 MED ORDER — FENTANYL CITRATE (PF) 250 MCG/5ML IJ SOLN
INTRAMUSCULAR | Status: AC
  Filled 2021-07-31: qty 5

## 2021-07-31 MED ORDER — ONDANSETRON HCL 4 MG/2ML IJ SOLN: 4.0000 mg | Freq: Four times a day (QID) | INTRAMUSCULAR | Status: DC | PRN

## 2021-07-31 MED ORDER — QUETIAPINE FUMARATE 25 MG PO TABS
25.0000 mg | ORAL_TABLET | Freq: Every morning | ORAL | Status: DC
  Administered 2021-08-01 – 2021-08-06 (×6): 25 mg via ORAL
  Filled 2021-07-31 (×7): qty 1

## 2021-07-31 MED ORDER — LACTATED RINGERS IV SOLN: INTRAVENOUS | Status: DC

## 2021-07-31 MED ORDER — FENTANYL CITRATE (PF) 100 MCG/2ML IJ SOLN
INTRAMUSCULAR | Status: AC
  Administered 2021-07-31: 100 ug via INTRAVENOUS
  Filled 2021-07-31: qty 2

## 2021-07-31 MED ORDER — ACETAMINOPHEN 10 MG/ML IV SOLN
INTRAVENOUS | Status: AC
Start: 2021-07-31 — End: ?
  Filled 2021-07-31: qty 100

## 2021-07-31 MED ORDER — PANTOPRAZOLE SODIUM 40 MG PO TBEC
40.0000 mg | DELAYED_RELEASE_TABLET | Freq: Every day | ORAL | Status: DC
  Administered 2021-08-01 – 2021-08-06 (×6): 40 mg via ORAL
  Filled 2021-07-31 (×7): qty 1

## 2021-07-31 MED ORDER — LIDOCAINE 2% (20 MG/ML) 5 ML SYRINGE
INTRAMUSCULAR | Status: DC | PRN
  Administered 2021-07-31: 100 mg via INTRAVENOUS

## 2021-07-31 MED ORDER — METHOCARBAMOL 1000 MG/10ML IJ SOLN
500.0000 mg | Freq: Four times a day (QID) | INTRAVENOUS | Status: DC | PRN
  Filled 2021-07-31: qty 5

## 2021-07-31 MED ORDER — ONDANSETRON HCL 4 MG/2ML IJ SOLN
INTRAMUSCULAR | Status: DC | PRN
  Administered 2021-07-31: 4 mg via INTRAVENOUS

## 2021-07-31 MED ORDER — VENLAFAXINE HCL ER 75 MG PO CP24
75.0000 mg | ORAL_CAPSULE | Freq: Every day | ORAL | Status: DC
  Administered 2021-08-01 – 2021-08-06 (×6): 75 mg via ORAL
  Filled 2021-07-31 (×7): qty 1

## 2021-07-31 MED ORDER — MIDAZOLAM HCL 2 MG/2ML IJ SOLN
INTRAMUSCULAR | Status: AC
  Filled 2021-07-31: qty 2

## 2021-07-31 MED ORDER — QUETIAPINE FUMARATE 25 MG PO TABS
50.0000 mg | ORAL_TABLET | Freq: Every day | ORAL | Status: DC
  Administered 2021-07-31 – 2021-08-05 (×6): 50 mg via ORAL
  Filled 2021-07-31 (×2): qty 2
  Filled 2021-07-31: qty 1
  Filled 2021-07-31 (×4): qty 2

## 2021-07-31 MED ORDER — POVIDONE-IODINE 7.5 % EX SOLN: Freq: Once | CUTANEOUS | Status: DC

## 2021-07-31 MED ORDER — CARVEDILOL 12.5 MG PO TABS
12.5000 mg | ORAL_TABLET | Freq: Two times a day (BID) | ORAL | Status: DC
  Administered 2021-08-01 – 2021-08-06 (×11): 12.5 mg via ORAL
  Filled 2021-07-31 (×12): qty 1

## 2021-07-31 MED ORDER — RIVAROXABAN 20 MG PO TABS
20.0000 mg | ORAL_TABLET | Freq: Every day | ORAL | Status: DC
  Administered 2021-08-01 – 2021-08-06 (×6): 20 mg via ORAL
  Filled 2021-07-31 (×7): qty 1

## 2021-07-31 MED ORDER — FENTANYL CITRATE (PF) 250 MCG/5ML IJ SOLN
INTRAMUSCULAR | Status: DC | PRN
  Administered 2021-07-31: 25 ug via INTRAVENOUS
  Administered 2021-07-31 (×3): 50 ug via INTRAVENOUS
  Administered 2021-07-31: 100 ug via INTRAVENOUS

## 2021-07-31 MED ORDER — DEXAMETHASONE SODIUM PHOSPHATE 4 MG/ML IJ SOLN
INTRAMUSCULAR | Status: DC | PRN
  Administered 2021-07-31: 5 mg via PERINEURAL

## 2021-07-31 MED ORDER — FENTANYL CITRATE (PF) 100 MCG/2ML IJ SOLN
INTRAMUSCULAR | Status: AC
  Filled 2021-07-31: qty 2

## 2021-07-31 MED ORDER — HYDROMORPHONE HCL 1 MG/ML IJ SOLN
INTRAMUSCULAR | Status: AC
  Filled 2021-07-31: qty 1

## 2021-07-31 MED ORDER — INSULIN ASPART 100 UNIT/ML IJ SOLN: 0.0000 [IU] | INTRAMUSCULAR | Status: DC | PRN

## 2021-07-31 MED ORDER — ACETAMINOPHEN 10 MG/ML IV SOLN
INTRAVENOUS | Status: DC | PRN
  Administered 2021-07-31: 1000 mg via INTRAVENOUS

## 2021-07-31 MED ORDER — PROPOFOL 10 MG/ML IV BOLUS
INTRAVENOUS | Status: DC | PRN
  Administered 2021-07-31: 40 mg via INTRAVENOUS
  Administered 2021-07-31: 50 mg via INTRAVENOUS
  Administered 2021-07-31: 160 mg via INTRAVENOUS

## 2021-07-31 MED ORDER — DOCUSATE SODIUM 100 MG PO CAPS
100.0000 mg | ORAL_CAPSULE | Freq: Two times a day (BID) | ORAL | Status: DC
Start: 2021-08-01 — End: 2021-08-06
  Administered 2021-08-01 – 2021-08-06 (×11): 100 mg via ORAL
  Filled 2021-07-31 (×11): qty 1

## 2021-07-31 MED ORDER — CHLORHEXIDINE GLUCONATE 0.12 % MT SOLN
OROMUCOSAL | Status: AC
  Administered 2021-07-31: 15 mL via OROMUCOSAL
  Filled 2021-07-31: qty 15

## 2021-07-31 MED ORDER — FENTANYL CITRATE (PF) 100 MCG/2ML IJ SOLN
25.0000 ug | INTRAMUSCULAR | Status: DC | PRN
  Administered 2021-07-31 (×3): 50 ug via INTRAVENOUS

## 2021-07-31 MED ORDER — CHLORHEXIDINE GLUCONATE 0.12 % MT SOLN: 15.0000 mL | Freq: Once | OROMUCOSAL | Status: AC

## 2021-07-31 MED ORDER — FENTANYL CITRATE (PF) 100 MCG/2ML IJ SOLN: 100.0000 ug | Freq: Once | INTRAMUSCULAR | Status: AC

## 2021-07-31 MED ORDER — MIDAZOLAM HCL 2 MG/2ML IJ SOLN
INTRAMUSCULAR | Status: DC | PRN
  Administered 2021-07-31 (×2): 1 mg via INTRAVENOUS

## 2021-07-31 MED ORDER — CEFAZOLIN SODIUM-DEXTROSE 2-4 GM/100ML-% IV SOLN
2.0000 g | Freq: Three times a day (TID) | INTRAVENOUS | Status: AC
  Administered 2021-07-31 – 2021-08-01 (×3): 2 g via INTRAVENOUS
  Filled 2021-07-31 (×3): qty 100

## 2021-07-31 MED ORDER — ACETAMINOPHEN 325 MG PO TABS: 325.0000 mg | ORAL_TABLET | Freq: Four times a day (QID) | ORAL | Status: DC | PRN

## 2021-07-31 MED ORDER — GABAPENTIN 300 MG PO CAPS
300.0000 mg | ORAL_CAPSULE | Freq: Two times a day (BID) | ORAL | Status: DC
  Administered 2021-08-01 – 2021-08-06 (×11): 300 mg via ORAL
  Filled 2021-07-31 (×12): qty 1

## 2021-07-31 MED ORDER — CLONIDINE HCL (ANALGESIA) 100 MCG/ML EP SOLN
EPIDURAL | Status: DC | PRN
  Administered 2021-07-31: 70 ug

## 2021-07-31 MED ORDER — METFORMIN HCL ER 500 MG PO TB24
1000.0000 mg | ORAL_TABLET | Freq: Every day | ORAL | Status: DC
  Administered 2021-07-31 – 2021-08-05 (×6): 1000 mg via ORAL
  Filled 2021-07-31 (×7): qty 2

## 2021-07-31 MED ORDER — POVIDONE-IODINE 10 % EX SWAB
2.0000 | Freq: Once | CUTANEOUS | Status: AC
  Administered 2021-07-31: 2 via TOPICAL

## 2021-07-31 MED ORDER — ROCURONIUM BROMIDE 10 MG/ML (PF) SYRINGE
PREFILLED_SYRINGE | INTRAVENOUS | Status: DC | PRN
  Administered 2021-07-31: 60 mg via INTRAVENOUS

## 2021-07-31 MED ORDER — VANCOMYCIN HCL 1000 MG IV SOLR
INTRAVENOUS | Status: AC
  Filled 2021-07-31: qty 20

## 2021-07-31 MED ORDER — PROPOFOL 10 MG/ML IV BOLUS
INTRAVENOUS | Status: AC
  Filled 2021-07-31: qty 20

## 2021-07-31 MED ORDER — ACETAMINOPHEN 500 MG PO TABS
1000.0000 mg | ORAL_TABLET | Freq: Four times a day (QID) | ORAL | Status: AC
  Administered 2021-08-01 (×4): 1000 mg via ORAL
  Filled 2021-07-31 (×4): qty 2

## 2021-07-31 MED ORDER — AMISULPRIDE (ANTIEMETIC) 5 MG/2ML IV SOLN: 10.0000 mg | Freq: Once | INTRAVENOUS | Status: DC | PRN

## 2021-07-31 MED ORDER — ALBUTEROL SULFATE (2.5 MG/3ML) 0.083% IN NEBU: 2.5000 mg | INHALATION_SOLUTION | Freq: Four times a day (QID) | RESPIRATORY_TRACT | Status: DC | PRN

## 2021-07-31 MED ORDER — ONDANSETRON HCL 4 MG PO TABS: 4.0000 mg | ORAL_TABLET | Freq: Four times a day (QID) | ORAL | Status: DC | PRN

## 2021-07-31 MED ORDER — ALBUTEROL SULFATE HFA 108 (90 BASE) MCG/ACT IN AERS: 2.0000 | INHALATION_SPRAY | Freq: Four times a day (QID) | RESPIRATORY_TRACT | Status: DC | PRN

## 2021-07-31 MED ORDER — SODIUM CHLORIDE 0.9 % IV SOLN: INTRAVENOUS | Status: AC

## 2021-07-31 MED ORDER — OXYCODONE HCL 5 MG PO TABS
10.0000 mg | ORAL_TABLET | ORAL | Status: DC
  Administered 2021-07-31 – 2021-08-06 (×31): 10 mg via ORAL
  Filled 2021-07-31 (×32): qty 2

## 2021-07-31 MED ORDER — HYDROMORPHONE HCL 1 MG/ML IJ SOLN
0.2500 mg | INTRAMUSCULAR | Status: DC | PRN
  Administered 2021-07-31 (×3): 0.5 mg via INTRAVENOUS

## 2021-07-31 MED ORDER — ROPIVACAINE HCL 5 MG/ML IJ SOLN
INTRAMUSCULAR | Status: DC | PRN
  Administered 2021-07-31: 30 mL via PERINEURAL

## 2021-07-31 MED ORDER — 0.9 % SODIUM CHLORIDE (POUR BTL) OPTIME
TOPICAL | Status: DC | PRN
  Administered 2021-07-31: 4000 mL

## 2021-07-31 MED ORDER — ORAL CARE MOUTH RINSE: 15.0000 mL | Freq: Once | OROMUCOSAL | Status: AC

## 2021-07-31 MED ORDER — METOCLOPRAMIDE HCL 5 MG PO TABS: 5.0000 mg | ORAL_TABLET | Freq: Three times a day (TID) | ORAL | Status: DC | PRN

## 2021-07-31 MED ORDER — TRANEXAMIC ACID-NACL 1000-0.7 MG/100ML-% IV SOLN
1000.0000 mg | INTRAVENOUS | Status: DC
  Filled 2021-07-31: qty 100

## 2021-07-31 MED ORDER — HYDROMORPHONE HCL 1 MG/ML IJ SOLN
0.5000 mg | INTRAMUSCULAR | Status: DC | PRN
  Administered 2021-07-31 – 2021-08-02 (×5): 0.5 mg via INTRAVENOUS
  Filled 2021-07-31 (×5): qty 0.5

## 2021-07-31 MED ORDER — VANCOMYCIN HCL 1000 MG IV SOLR
INTRAVENOUS | Status: DC | PRN
  Administered 2021-07-31: 1000 mg

## 2021-07-31 MED ORDER — SUGAMMADEX SODIUM 200 MG/2ML IV SOLN
INTRAVENOUS | Status: DC | PRN
  Administered 2021-07-31: 200 mg via INTRAVENOUS

## 2021-07-31 SURGICAL SUPPLY — 70 items
BAG COUNTER SPONGE SURGICOUNT (BAG) ×2 IMPLANT
BANDAGE ESMARK 6X9 LF (GAUZE/BANDAGES/DRESSINGS) IMPLANT
BIT DRILL QC 2.5MM SHRT EVO SM (DRILL) IMPLANT
BLADE CLIPPER SURG (BLADE) IMPLANT
BLADE SURG 15 STRL LF DISP TIS (BLADE) ×1 IMPLANT
BLADE SURG 15 STRL SS (BLADE) ×2
BNDG COHESIVE 6X5 TAN STRL LF (GAUZE/BANDAGES/DRESSINGS) ×2 IMPLANT
BNDG ELASTIC 4X5.8 VLCR STR LF (GAUZE/BANDAGES/DRESSINGS) ×2 IMPLANT
BNDG ELASTIC 6X10 VLCR STRL LF (GAUZE/BANDAGES/DRESSINGS) ×1 IMPLANT
BNDG ELASTIC 6X5.8 VLCR STR LF (GAUZE/BANDAGES/DRESSINGS) ×2 IMPLANT
BNDG ESMARK 6X9 LF (GAUZE/BANDAGES/DRESSINGS)
BNDG GAUZE ELAST 4 BULKY (GAUZE/BANDAGES/DRESSINGS) ×2 IMPLANT
CUFF TOURN SGL QUICK 34 (TOURNIQUET CUFF)
CUFF TOURN SGL QUICK 42 (TOURNIQUET CUFF) IMPLANT
CUFF TRNQT CYL 34X4.125X (TOURNIQUET CUFF) IMPLANT
DRAPE C-ARM 42X72 X-RAY (DRAPES) ×2 IMPLANT
DRAPE HALF SHEET 40X57 (DRAPES) ×4 IMPLANT
DRAPE IMP U-DRAPE 54X76 (DRAPES) ×2 IMPLANT
DRAPE ORTHO SPLIT 77X108 STRL (DRAPES) ×4
DRAPE SURG ORHT 6 SPLT 77X108 (DRAPES) ×2 IMPLANT
DRAPE U-SHAPE 47X51 STRL (DRAPES) ×2 IMPLANT
DRILL QC 2.5MM SHORT EVOS SM (DRILL) ×2
DRSG AQUACEL AG ADV 3.5X10 (GAUZE/BANDAGES/DRESSINGS) ×1 IMPLANT
DURAPREP 26ML APPLICATOR (WOUND CARE) ×2 IMPLANT
ELECT REM PT RETURN 9FT ADLT (ELECTROSURGICAL) ×2
ELECTRODE REM PT RTRN 9FT ADLT (ELECTROSURGICAL) ×1 IMPLANT
GAUZE SPONGE 4X4 12PLY STRL (GAUZE/BANDAGES/DRESSINGS) ×4 IMPLANT
GLOVE BIOGEL PI IND STRL 8 (GLOVE) ×1 IMPLANT
GLOVE BIOGEL PI INDICATOR 8 (GLOVE) ×1
GLOVE ECLIPSE 8.0 STRL XLNG CF (GLOVE) ×2 IMPLANT
GOWN STRL REUS W/ TWL LRG LVL3 (GOWN DISPOSABLE) ×2 IMPLANT
GOWN STRL REUS W/ TWL XL LVL3 (GOWN DISPOSABLE) ×1 IMPLANT
GOWN STRL REUS W/TWL LRG LVL3 (GOWN DISPOSABLE) ×4
GOWN STRL REUS W/TWL XL LVL3 (GOWN DISPOSABLE) ×2
IMMOBILIZER KNEE 22 UNIV (SOFTGOODS) ×1 IMPLANT
K-WIRE 1.6 (WIRE) ×2
K-WIRE FX150X1.6XTROC PNT (WIRE) ×1
KIT BASIN OR (CUSTOM PROCEDURE TRAY) ×2 IMPLANT
KIT TURNOVER KIT B (KITS) ×2 IMPLANT
KWIRE FX150X1.6XTROC PNT (WIRE) IMPLANT
MANIFOLD NEPTUNE II (INSTRUMENTS) ×2 IMPLANT
NS IRRIG 1000ML POUR BTL (IV SOLUTION) ×2 IMPLANT
PACK GENERAL/GYN (CUSTOM PROCEDURE TRAY) ×2 IMPLANT
PACK UNIVERSAL I (CUSTOM PROCEDURE TRAY) ×2 IMPLANT
PAD ARMBOARD 7.5X6 YLW CONV (MISCELLANEOUS) ×4 IMPLANT
PLATE 3.5 MED DIST FEM 115 L (Plate) ×1 IMPLANT
SCREW CORT ST EVOS 3.5X46 (Screw) ×1 IMPLANT
SCREW CTX 3.5X36MM EVOS (Screw) ×1 IMPLANT
SCREW CTX 3.5X38MM EVOS (Screw) ×1 IMPLANT
SCREW CTX 3.5X50MM EVOS (Screw) ×1 IMPLANT
SCREW CTX ST EVOS 3.5X40 (Screw) ×1 IMPLANT
SCREW LOCK 30X3.5XSTSTRL (Screw) IMPLANT
SCREW LOCK EVOS ST 3.5X16 (Screw) ×1 IMPLANT
SCREW LOCK ST EVOS 3.5 X 26 (Screw) ×1 IMPLANT
SCREW LOCK ST EVOS 3.5X24 (Screw) ×2 IMPLANT
SCREW LOCKING 3.5X30 (Screw) ×2 IMPLANT
STAPLER VISISTAT 35W (STAPLE) ×2 IMPLANT
STOCKINETTE IMPERVIOUS LG (DRAPES) ×2 IMPLANT
STRIP CLOSURE SKIN 1/2X4 (GAUZE/BANDAGES/DRESSINGS) ×2 IMPLANT
SUT MNCRL AB 3-0 PS2 27 (SUTURE) ×1 IMPLANT
SUT VIC AB 0 CT1 27 (SUTURE) ×6
SUT VIC AB 0 CT1 27XBRD ANBCTR (SUTURE) ×2 IMPLANT
SUT VIC AB 1 CT1 36 (SUTURE) ×3 IMPLANT
SUT VIC AB 2-0 CT1 27 (SUTURE) ×2
SUT VIC AB 2-0 CT1 TAPERPNT 27 (SUTURE) IMPLANT
SUT VIC AB 2-0 CTB1 (SUTURE) ×4 IMPLANT
SYR CONTROL 10ML LL (SYRINGE) ×2 IMPLANT
TOWEL GREEN STERILE (TOWEL DISPOSABLE) ×2 IMPLANT
TOWEL GREEN STERILE FF (TOWEL DISPOSABLE) ×2 IMPLANT
WATER STERILE IRR 1000ML POUR (IV SOLUTION) ×2 IMPLANT

## 2021-07-31 NOTE — Transfer of Care (Signed)
Immediate Anesthesia Transfer of Care Note  Patient: [name] [facility]  Procedure(s) Performed: left medial femoral condyle fracture fixation (Left)  Patient Location: PACU  Anesthesia Type:General  Level of Consciousness: awake, alert  and oriented  Airway & Oxygen Therapy: Patient Spontanous Breathing and Patient connected to nasal cannula oxygen  Post-op Assessment: Report given to RN and Post -op Vital signs reviewed and stable  Post vital signs: Reviewed and stable  Last Vitals:  Vitals Value Taken Time  BP 138/109 07/31/21 1852  Temp    Pulse 75 07/31/21 1854  Resp 14 07/31/21 1854  SpO2 92 % 07/31/21 1854  Vitals shown include unvalidated device data.  Last Pain:  Vitals:   07/31/21 1505  TempSrc:   PainSc: 0-No pain         Complications: No notable events documented.

## 2021-07-31 NOTE — Brief Op Note (Signed)
   07/31/2021  6:44 PM  PATIENT:  Cheral [place] Speelman  61 y.o. female  PRE-OPERATIVE DIAGNOSIS:  peri prosthetic fracture left medial femoral condyle  POST-OPERATIVE DIAGNOSIS:  peri prosthetic fracture left medial femoral condyle  PROCEDURE:  Procedure(s): left medial femoral condyle fracture fixation  SURGEON:  Surgeon(s): Meredith Pel, MD  ASSISTANT: magnant pa  ANESTHESIA:   general  EBL: 50 ml    Total I/O In: 1100 [I.V.:1000; IV Piggyback:100] Out: -   BLOOD ADMINISTERED: none  DRAINS: none   LOCAL MEDICATIONS USED:  vanco  SPECIMEN:  No Specimen  COUNTS:  YES  TOURNIQUET:   Total Tourniquet Time Documented: Thigh (Left) - 43 minutes Total: Thigh (Left) - 43 minutes   DICTATION: .Other Dictation: Dictation Number [id number]  PLAN OF CARE: Admit to inpatient   PATIENT DISPOSITION:  PACU - hemodynamically stable

## 2021-07-31 NOTE — H&P (Signed)
[name] is an 61 y.o. female.   Chief Complaint: Left leg pain HPI: [name] is a 61 year old patient with left leg pain.  Sustained a fall approximately July 4.  Was noted to have minimally displaced medial femoral condyle fracture.  CT scanning demonstrated intact components of her total knee which was placed several years ago.  Patient had been ambulating well prior to this injury.  Denies any other orthopedic complaints.  She has multiple medical problems including history of DVT.  She is also on chronic pain medicine.  She stopped her Xarelto over 48 hours ago and presents now for operative management of left medial epicondyle distal femur fracture.  Past Medical History:  Diagnosis Date   ALLERGIC RHINITIS 10/26/2009   no current problems   Anxiety    B12 DEFICIENCY 08/25/2007   takes B12 supplement   Complication of anesthesia    pt has had difficulty following anesthesia with her knee in 2016-unable to care for herself afterward   Depression    takes effexor xr daily   Diabetes mellitus without complication ([place])    was on insulin but has been off since Nov 2015 and now only takes Metformin daily   DYSPNEA 04/28/2009   uses oxygen 24/7, on 2L via [place]   Esophageal reflux    takes Nexium daily   Fibromyalgia    Headache    last migraine 2-16yr ago;takes Topamax daily   History of shingles    Hypertension    takes Coreg daily   Insomnia    takes Nortriptyline nightly    Long-term memory loss    OSA (obstructive sleep apnea)    doesn't use CPAP;sleep study in epic from 2006   Osteoarthritis    Panic attacks    PONV (postoperative nausea and vomiting)    on time in 2016 knee surgery   [place]    Sarcoidosis    Dr. ZLolita Garrison  Stroke ([facility]    Vitamin D deficiency    is supposed to take Vit D but can't afford it    Past Surgical History:  Procedure Laterality Date   APPENDECTOMY     arthroscopic knee surgery Right 11-12-04   AXILLARY  ABCESS IRRIGATION AND DEBRIDEMENT  Jul & Aug2012   BIOPSY  11/02/2019   Procedure: BIOPSY;  Surgeon: PJerene Bears MD;  [facility];  Service: Gastroenterology;;   CARPAL TUNNEL RELEASE Left 05/23/2014   Procedure: CARPAL TUNNEL RELEASE;  Surgeon: SMeredith Pel MD;  [place] [facility]  Service: Orthopedics;  Laterality: Left;   CHOLECYSTECTOMY N/A 02/11/2018   Procedure: LAPAROSCOPIC CHOLECYSTECTOMY WITH INTRAOPERATIVE CHOLANGIOGRAM ERAS PATHWAY;  Surgeon: TDonnie Mesa MD;  [facility]  Service: General;  Laterality: N/A;   COLONOSCOPY WITH PROPOFOL N/A 11/02/2019   Procedure: COLONOSCOPY WITH PROPOFOL;  Surgeon: PJerene Bears MD;  [facility];  Service: Gastroenterology;  Laterality: N/A;   cyst removed from top of buttocks  at age 61  DACRORHINOCYSTOTOMY Bilateral 03/23/2021   Procedure: ENDOSCOPIC DACROCYSTORHINOSTOMY;  Surgeon: LDelia Chimes MD;  [place]  Service: Ophthalmology;  Laterality: Bilateral;   ENDOMETRIAL ABLATION     IR CHOLANGIOGRAM EXISTING TUBE  11/21/2017   IR CHOLANGIOGRAM EXISTING TUBE  01/16/2018   IR EXCHANGE BILIARY DRAIN  11/10/2017   IR PATIENT EVAL TECH 0-60 MINS  02/03/2018   IR PERC CHOLECYSTOSTOMY  09/13/2017   IR RADIOLOGIST EVAL & MGMT  10/08/2017   LACRIMAL DUCT EXPLORATION Right 06/26/2017  Procedure: LACRIMAL DUCT EXPLORATION AND ETHMOIDECTOMY;  Surgeon: Clista Bernhardt, MD;  Location: [place];  Service: Ophthalmology;  Laterality: Right;   LACRIMAL DUCT EXPLORATION Bilateral 03/23/2021   Procedure: NASAL LACRIMAL DUCT EXPLORATION;  Surgeon: Delia Chimes, MD;  Location: [place];  Service: Ophthalmology;  Laterality: Bilateral;   LACRIMAL TUBE INSERTION Bilateral 03/23/2021   Procedure: INSERTION OF CRAWFORD LACRIMAL STENT;  Surgeon: Delia Chimes, MD;  Location: [place];  Service: Ophthalmology;  Laterality: Bilateral;   POLYPECTOMY  11/02/2019   Procedure: POLYPECTOMY;  Surgeon: Jerene Bears, MD;  [facility];  Service: Gastroenterology;;   TEAR DUCT PROBING Right 06/26/2017   Procedure: TEAR DUCT PROBING WITH STENT;  Surgeon: Clista Bernhardt, MD;  Location: [place];  Service: Ophthalmology;  Laterality: Right;   TOTAL KNEE ARTHROPLASTY Right 11/15/2014   Procedure: TOTAL RIGHT KNEE ARTHROPLASTY;  Surgeon: Meredith Pel, MD;  Location: [place];  Service: Orthopedics;  Laterality: Right;   TOTAL KNEE ARTHROPLASTY Left 07/13/2015   Procedure: LEFT TOTAL KNEE ARTHROPLASTY;  Surgeon: Meredith Pel, MD;  Location: [place];  Service: Orthopedics;  Laterality: Left;    Family History  Problem Relation Age of Onset   Heart disease Mother    Kidney disease Mother        renal failure   Cancer Father        leukemia   Hypertension Other    Coronary artery disease Other        female 1st degree relative <60   Heart failure Other        congestive   Coronary artery disease Other        Female 1st degree relative <50   Breast cancer Other        1st degree relative <50 S   Breast cancer Sister    Colon cancer Brother 21   Stroke Brother    Heart attack Brother    Cancer Brother 70       colon ca   Esophageal cancer Neg Hx    Stomach cancer Neg Hx    Rectal cancer Neg Hx    Social History:  reports that she quit smoking about 19 years ago. Her smoking use included cigarettes. She started smoking about 35 years ago. She has a 5.00 pack-year smoking history. She has never used smokeless tobacco. She reports that she does not currently use alcohol after a past usage of about 1.0 standard drink of alcohol per week. She reports that she does not use drugs.  Allergies:  Allergies  Allergen Reactions   Belsomra [Suvorexant] Other (See Comments)    [place] out of bed while asleep: "I'm waking up as I'm falling on the floor;" "Night terrors"   Enalapril Maleate Cough   Latex Hives   Nickel Other (See Comments)    Blisters  Pt has a titanium right and left knee - nickel causes skin  irritations that form into blisters and sores   Other Other (See Comments)    Patient has Sarcoidosis and can't tolerate any metals   Morphine And Related Itching and Nausea Only   Doxycycline Diarrhea and Nausea And Vomiting   Hydrochlorothiazide Other (See Comments)    Low potassium levels    Hydroxychloroquine Sulfate Other (See Comments)    Vision changes    Lyrica [Pregabalin] Other (See Comments)    "Made me feel high"   Tape Other (See Comments)    Medical tape causes bruising    Medications  Prior to Admission  Medication Sig Dispense Refill   acetaminophen (TYLENOL) 500 MG tablet Take 1,000 mg by mouth every 8 (eight) hours as needed for moderate pain.     albuterol (VENTOLIN HFA) 108 (90 Base) MCG/ACT inhaler INHALE TWO puffs into THE lungs EVERY SIX HOURS AS NEEDED wheezind AND For SHORTNESS OF BREATH 6.7 g 3   B-D ULTRA-FINE 33 LANCETS MISC Use as instructed to test sugars once  daily 100 each 12   budesonide-formoterol (SYMBICORT) 160-4.5 MCG/ACT inhaler Inhale 2 puffs into the lungs TWICE DAILY (Patient taking differently: 2 puffs 2 (two) times daily.) 10.2 g 5   carvedilol (COREG) 12.5 MG tablet Take 1 tablet (12.5 mg total) by mouth 2 (two) times daily with a meal. 180 tablet 1   Cholecalciferol (VITAMIN D-3) 125 MCG (5000 UT) TABS Take 5,000 Units by mouth daily.     Cyanocobalamin (VITAMIN B-12) 1000 MCG SUBL Place 1 tablet (1,000 mcg total) under the tongue daily. (Patient taking differently: Place 1,000 mcg under the tongue daily.) 100 tablet 3   dexlansoprazole (DEXILANT) 60 MG capsule TAKE ONE CAPSULE BY MOUTH EVERY DAY Annual appt due in July must see provider for future refills 90 capsule 0   gabapentin (NEURONTIN) 300 MG capsule TAKE ONE CAPSULE BY MOUTH EVERY MORNING **May take a senod capsule during THE DAY AS NEEDED FOR PAIN 180 capsule 0   HUMIRA PEN 40 MG/0.4ML PNKT Inject 40 mg as directed every Wednesday.      hydrOXYzine (ATARAX) 25 MG tablet Take 1 tablet  (25 mg total) by mouth every 8 (eight) hours as needed. (Patient taking differently: Take 6.25-12.5 mg by mouth See admin instructions. 6.25 mg in the morning, 12.5 mg at bedtime) 60 tablet 3   metFORMIN (GLUCOPHAGE-XR) 500 MG 24 hr tablet Take 2 tablets (1,000 mg total) by mouth at bedtime. Annual appt due in July must see provider for future refills 180 tablet 0   Multiple Vitamins-Minerals (MULTIVITAMIN WOMEN 50+) TABS Take 1 tablet by mouth daily.     naltrexone (DEPADE) 50 MG tablet Take 12.5 mg by mouth at bedtime.      neomycin-polymyxin b-dexamethasone (MAXITROL) 3.5-10000-0.1 SUSP Place 1 drop into both eyes 4 (four) times daily as needed (eye irritation).     neomycin-polymyxin-hydrocortisone (CORTISPORIN) OTIC solution Apply 1 drop to the left hallux at the area of soreness and pain at the toenail once daily until symptoms have resolved 10 mL 0   Omega-3 Fatty Acids (FISH OIL) 1200 MG CAPS Take 1,200 mg by mouth daily.     OVER THE COUNTER MEDICATION Take 10 mLs by mouth daily. Makes her own sea moss gel     oxyCODONE-acetaminophen (PERCOCET) 7.5-325 MG tablet Take 1 tablet by mouth 2 (two) times daily as needed for severe pain.     OXYGEN Inhale 2 L into the lungs continuous.     polyvinyl alcohol (LIQUIFILM TEARS) 1.4 % ophthalmic solution Place 1 drop into both eyes as needed for dry eyes. 15 mL 0   potassium chloride (KLOR-CON) 10 MEQ tablet Take 1 tablet (10 mEq total) by mouth daily. 90 tablet 3   QUEtiapine (SEROQUEL) 25 MG tablet Take 1 in am and 2 at hs 90 tablet 3   topiramate (TOPAMAX) 50 MG tablet Take 1 tablet (50 mg total) by mouth 2 (two) times daily. 180 tablet 1   venlafaxine XR (EFFEXOR-XR) 75 MG 24 hr capsule Take 1 capsule (75 mg total) by mouth daily with breakfast. 30  capsule 11   vitamin E 180 MG (400 UNITS) capsule Take 400 Units by mouth daily.     glucose blood (ACCU-CHEK GUIDE) test strip Use to check blood sugar once a day. 100 each 12   naloxone (NARCAN) nasal  spray 4 mg/0.1 mL Place 1 spray into the nose once as needed (overdose).     XARELTO 20 MG TABS tablet Take 1 tablet (20 mg total) by mouth at bedtime. 90 tablet 2    Results for orders placed or performed during the hospital encounter of 07/31/21 (from the past 48 hour(s))  Glucose, capillary     Status: Abnormal   Collection Time: 07/31/21  1:22 PM  Result Value Ref Range   Glucose-Capillary 113 (H) 70 - 99 mg/dL    Comment: Glucose reference range applies only to samples taken after fasting for at least 8 hours.  Basic metabolic panel     Status: Abnormal   Collection Time: 07/31/21  1:27 PM  Result Value Ref Range   Sodium 144 135 - 145 mmol/L   Potassium 3.9 3.5 - 5.1 mmol/L   Chloride 107 98 - 111 mmol/L   CO2 22 22 - 32 mmol/L   Glucose, Bld 115 (H) 70 - 99 mg/dL    Comment: Glucose reference range applies only to samples taken after fasting for at least 8 hours.   BUN 22 8 - 23 mg/dL   Creatinine, Ser 0.88 0.44 - 1.00 mg/dL   Calcium 9.4 8.9 - 10.3 mg/dL   GFR, Estimated >60 >60 mL/min    Comment: (NOTE) Calculated using the CKD-EPI Creatinine Equation (2021)    Anion gap 15 5 - 15    Comment: Performed at [facility] [address], [place], [place] [address]  CBC     Status: Abnormal   Collection Time: 07/31/21  1:27 PM  Result Value Ref Range   WBC 7.2 4.0 - 10.5 K/uL   RBC 3.88 3.87 - 5.11 MIL/uL   Hemoglobin 12.3 12.0 - 15.0 g/dL   HCT 38.6 36.0 - 46.0 %   MCV 99.5 80.0 - 100.0 fL   MCH 31.7 26.0 - 34.0 pg   MCHC 31.9 30.0 - 36.0 g/dL   RDW 15.9 (H) 11.5 - 15.5 %   Platelets 198 150 - 400 K/uL   nRBC 0.0 0.0 - 0.2 %    Comment: Performed at [facility] [address], [place], [place] [address]  Glucose, capillary     Status: Abnormal   Collection Time: 07/31/21  3:12 PM  Result Value Ref Range   Glucose-Capillary 118 (H) 70 - 99 mg/dL    Comment: Glucose reference range applies only to samples taken after fasting for at least 8 hours.    *Note: Due to a large number of results and/or encounters for the requested time period, some results have not been displayed. A complete set of results can be found in Results Review.   No results found.  Review of Systems  Musculoskeletal:  Positive for arthralgias.  All other systems reviewed and are negative.   Blood pressure (!) 168/75, pulse 71, temperature 98.4 F (36.9 C), temperature source Oral, resp. rate (!) 21, height '5\' 7"'$  (1.702 m), weight 118.8 kg, last menstrual period 05/19/2003, SpO2 100 %. Physical Exam Vitals reviewed.  HENT:     Head: Normocephalic.     Mouth/Throat:     Mouth: Mucous membranes are moist.  Eyes:     Pupils: Pupils  are equal, round, and reactive to light.  Cardiovascular:     Rate and Rhythm: Normal rate.     Pulses: Normal pulses.  Pulmonary:     Effort: Pulmonary effort is normal.  Abdominal:     General: Abdomen is flat.  Musculoskeletal:     Cervical back: Normal range of motion.  Skin:    General: Skin is warm.     Capillary Refill: Capillary refill takes less than 2 seconds.  Neurological:     General: No focal deficit present.     Mental Status: She is alert.  Psychiatric:        Mood and Affect: Mood normal.   Left knee demonstrates soft compartments with palpable pedal pulses.  Difficult to assess ligament stability due to guarding.  Effusion present in the knee.  Extensor mechanism intact.  Mild bruising is present.  No groin pain with internal/external rotation of either leg.  Assessment/Plan Impression is left total knee medial femoral condyle fracture.  Minimally displaced but it does extend above the top level of the prosthesis.  Prosthesis otherwise appears to be well fixed without issues.  Plan is open reduction internal fixation with plate application.  The risk and benefits are discussed with the patient including not limited to infection or vessel damage nonunion malunion as well as potential need for more surgery.   Patient understands risk benefits and wishes to proceed.  All questions answered  Anderson Malta, MD 07/31/2021, 3:54 PM

## 2021-07-31 NOTE — Anesthesia Postprocedure Evaluation (Signed)
Anesthesia Post Note  Patient: [name] [facility]  Procedure(s) Performed: left medial femoral condyle fracture fixation (Left)     Patient location during evaluation: PACU Anesthesia Type: General Level of consciousness: awake and alert Pain management: pain level controlled Vital Signs Assessment: post-procedure vital signs reviewed and stable Respiratory status: spontaneous breathing, nonlabored ventilation, respiratory function stable and patient connected to nasal cannula oxygen Cardiovascular status: blood pressure returned to baseline and stable Postop Assessment: no apparent nausea or vomiting Anesthetic complications: no   No notable events documented.  Last Vitals:  Vitals:   07/31/21 1505 07/31/21 1852  BP: (!) 168/75 (!) 155/93  Pulse: 71 75  Resp: (!) 21 15  Temp:  (!) 36.4 C  SpO2: 100% 93%    Last Pain:  Vitals:   07/31/21 1852  TempSrc:   PainSc: 8                  Marianna Cid S

## 2021-07-31 NOTE — Telephone Encounter (Signed)
Tried calling. No answer. Unable to LM. Dr Dean/Luke can discuss today post operatively

## 2021-07-31 NOTE — Anesthesia Procedure Notes (Signed)
Procedure Name: Intubation Date/Time: 07/31/2021 4:39 PM  Performed by: Inda Coke, CRNAPre-anesthesia Checklist: Patient identified, Emergency Drugs available, Suction available, Timeout performed and Patient being monitored Patient Re-evaluated:Patient Re-evaluated prior to induction Oxygen Delivery Method: Circle system utilized Preoxygenation: Pre-oxygenation with 100% oxygen Induction Type: IV induction Ventilation: Mask ventilation without difficulty Laryngoscope Size: Mac and 3 Tube type: Oral Tube size: 7.0 mm Airway Equipment and Method: Stylet Placement Confirmation: ETT inserted through vocal cords under direct vision, positive ETCO2, CO2 detector and breath sounds checked- equal and bilateral Secured at: 22 cm Tube secured with: Tape Dental Injury: Teeth and Oropharynx as per pre-operative assessment

## 2021-07-31 NOTE — Anesthesia Procedure Notes (Signed)
Anesthesia Regional Block: Adductor canal block   Pre-Anesthetic Checklist: , timeout performed,  Correct Patient, Correct Site, Correct Laterality,  Correct Procedure, Correct Position, site marked,  Risks and benefits discussed,  Surgical consent,  Pre-op evaluation,  At surgeon's request and post-op pain management  Laterality: Lower and Left  Prep: chloraprep       Needles:  Injection technique: Single-shot  Needle Type: Stimiplex     Needle Length: 9cm  Needle Gauge: 21     Additional Needles:   Procedures:,,,, ultrasound used (permanent image in chart),,    Narrative:  Start time: 07/31/2021 2:21 PM End time: 07/31/2021 2:41 PM Injection made incrementally with aspirations every 5 mL.  Performed by: Personally  Anesthesiologist: Nolon Nations, MD  Additional Notes: BP cuff, EKG monitors applied. Sedation begun. Artery and nerve location verified with ultrasound. Anesthetic injected incrementally (2m), slowly, and after negative aspirations under direct u/s guidance. Good fascial/perineural spread. Tolerated well.

## 2021-07-31 NOTE — Anesthesia Preprocedure Evaluation (Addendum)
Anesthesia Evaluation  Patient identified by MRN, date of birth, ID band Patient awake    Reviewed: Allergy & Precautions, H&P , NPO status , Patient's Chart, lab work & pertinent test results  History of Anesthesia Complications (+) PONV and history of anesthetic complications  Airway Mallampati: II  TM Distance: >3 FB Neck ROM: Full    Dental  (+) Dental Advisory Given, Edentulous Upper, Partial Lower, Missing   Pulmonary shortness of breath, sleep apnea , COPD, former smoker,    Pulmonary exam normal breath sounds clear to auscultation       Cardiovascular hypertension, Pt. on medications and Pt. on home beta blockers + CAD   Rhythm:Regular Rate:Normal  Echo 2018 - Left ventricle: The cavity size was normal. Wall thickness was increased in a pattern of mild LVH. Systolic function was normal. The estimated ejection fraction was in the range of 60% to 65%. Although no diagnostic regional wall motion abnormality was identified, this possibility cannot be completely excluded on the basis of this study. Doppler parameters are consistent with abnormal left ventricular relaxation (grade 1 diastolic dysfunction).   - Aortic valve: There was no stenosis.  - Mitral valve: There was no significant regurgitation.  - Right ventricle: The cavity size was normal. Systolic function was normal.  - Pulmonary arteries: No complete TR doppler jet so unable to estimate PA systolic pressure.  - Systemic veins: IVC not visualized.   Impressions:  - Normal LV size with mild LV hypertrophy. EF 60-65%. Normal RV size and systolic function. No significant valvular abnormalities.    Neuro/Psych  Headaches, PSYCHIATRIC DISORDERS Anxiety Depression TIA Neuromuscular disease CVA    GI/Hepatic GERD  ,  Endo/Other  diabetes, Type 2Morbid obesity  Renal/GU Renal disease     Musculoskeletal  (+) Arthritis , Fibromyalgia -  Abdominal (+) + obese,    Peds  Hematology  (+) Blood dyscrasia, anemia ,   Anesthesia Other Findings   Reproductive/Obstetrics                            Anesthesia Physical  Anesthesia Plan  ASA: 3  Anesthesia Plan: General   Post-op Pain Management: Tylenol PO (pre-op)* and Regional block*   Induction: Intravenous  PONV Risk Score and Plan: 4 or greater and Ondansetron, Dexamethasone, Treatment may vary due to age or medical condition and Midazolam  Airway Management Planned: Oral ETT  Additional Equipment: None  Intra-op Plan:   Post-operative Plan: Extubation in OR  Informed Consent: I have reviewed the patients History and Physical, chart, labs and discussed the procedure including the risks, benefits and alternatives for the proposed anesthesia with the patient or authorized representative who has indicated his/her understanding and acceptance.     Dental advisory given  Plan Discussed with: CRNA  Anesthesia Plan Comments:       Anesthesia Quick Evaluation

## 2021-08-01 ENCOUNTER — Encounter (HOSPITAL_COMMUNITY): Payer: Self-pay | Admitting: Orthopedic Surgery

## 2021-08-01 NOTE — Progress Notes (Signed)
  Subjective: Patient is a 61 year old female who presents s/p left medial condyle periprosthetic femur fracture ORIF.  She is POD 1.  Doing well.  Pain is controlled.  Pain is improved compared with prior to surgery.  She has not ambulated yet.  Denies any fevers, chills, night sweats, chest pain, shortness of breath, calf pain. Restarted Xarelto at 5:00 this morning.  She would like to go to inpatient rehab.  Objective: Vital signs in last 24 hours: Temp:  [97.5 F (36.4 C)-98.5 F (36.9 C)] 98.4 F (36.9 C) (07/19 0804) Pulse Rate:  [68-78] 72 (07/19 0804) Resp:  [14-21] 14 (07/19 0420) BP: (111-171)/(50-93) 114/53 (07/19 0804) SpO2:  [93 %-100 %] 100 % (07/19 0804) Weight:  [118.8 kg-134.1 kg] 134.1 kg (07/18 2100)  Intake/Output from previous day: 07/18 0701 - 07/19 0700 In: 1100 [I.V.:1000; IV Piggyback:100] Out: -  Intake/Output this shift: No intake/output data recorded.  Exam:  Ortho exam demonstrates left knee with dressing intact with no gross blood or drainage.  Small amount of bloody spotting noted at the proximal aspect of the bandage.  No calf tenderness.  Negative Homans' sign bilaterally.  2+ DP pulse of the operative extremity.  Intact ankle dorsiflexion, plantarflexion, EHL.  Patient is able to perform straight leg raise.  Labs: Recent Labs    07/31/21 1327  HGB 12.3   Recent Labs    07/31/21 1327  WBC 7.2  RBC 3.88  HCT 38.6  PLT 198   Recent Labs    07/31/21 1327  NA 144  K 3.9  CL 107  CO2 22  BUN 22  CREATININE 0.88  GLUCOSE 115*  CALCIUM 9.4   No results for input(s): "LABPT", "INR" in the last 72 hours.  Assessment/Plan: Plan is mobilized with physical therapy.  She is okay for 50% partial weightbearing with walker and knee immobilizer.  Consult placed for inpatient rehab.  If she is not eligible for this, plan for discharge to SNF.  She does not want to go to [facility].  Restarted Xarelto with her history of blood clots.  We will  monitor her for any sign or symptom of DVT/PE.   Ayyan Sites L Shelie Lansing 08/01/2021, 8:22 AM

## 2021-08-01 NOTE — Evaluation (Signed)
Physical Therapy Evaluation Patient Details Name: Jenissa Tyrell MRN: [id number] DOB: 01/28/1960 Today's Date: 08/01/2021  History of Present Illness  Zoie is a 61 year old patient with left leg pain after a fall earlyJuly resulting in a L periprosthetic (prev TKA) distal femur fx; s/p surgical repair, KI at all times, 50% PWB, no ROM knee;  has a past medical history of Depression, Diabetes mellitus without complication ([place]), DYSPNEA (04/28/2009), Fibromyalgia, Headache, History of shingles, Hypertension, Insomnia, Long-term memory loss, OSA (obstructive sleep apnea), Osteoarthritis, Panic attacks, Rheumatoid arthritis ([place]), Sarcoidosis, Stroke ([place]), and Vitamin D deficiency.  Clinical Impression   Pt admitted with above diagnosis. Lives at home alone (with PCA assist at least 5 days), in a single-level handicap accessible apartment; Prior to admission, pt was able to manage basic mobility, walking household distances with rollator RW, and independent with basic ADLs; PCA helps with IADLs; Presents to PT with weight bearing restrictions, requirement for L knee to be immobilized in extension, functional dependencies; Able to get up to EOB with light mod assist, stand with 2 person mod assist, and take a few pivotal steps for getting OOB; heavy use of rW; Recommend post-acute rehab to maximize independence and safety with mobility and ADLs; Unfortunately, CIR is not an option -- will rec SNF (pt chooses not to go to [facility]);  Pt currently with functional limitations due to the deficits listed below (see PT Problem List). Pt will benefit from skilled PT to increase their independence and safety with mobility to allow discharge to the venue listed below.          Recommendations for follow up therapy are one component of a multi-disciplinary discharge planning process, led by the attending physician.  Recommendations may be updated based on patient status, additional functional  criteria and insurance authorization.  Follow Up Recommendations Skilled nursing-short term rehab (<3 hours/day) (for post-acute rehab) Can patient physically be transported by private vehicle: No    Assistance Recommended at Discharge Intermittent Supervision/Assistance  Patient can return home with the following  A lot of help with walking and/or transfers;Two people to help with bathing/dressing/bathroom;Assistance with cooking/housework;Assist for transportation    Equipment Recommendations Other (comment) (Well-equipped)  Recommendations for Other Services       Functional Status Assessment Patient has had a recent decline in their functional status and demonstrates the ability to make significant improvements in function in a reasonable and predictable amount of time.     Precautions / Restrictions Precautions Precautions: Fall Precaution Comments: No ROM about L knee Required Braces or Orthoses: Knee Immobilizer - Left Restrictions LLE Weight Bearing: Partial weight bearing LLE Partial Weight Bearing Percentage or Pounds: 50 Other Position/Activity Restrictions: No ROM L knee      Mobility  Bed Mobility Overal bed mobility: Needs Assistance Bed Mobility: Supine to Sit     Supine to sit: Mod assist     General bed mobility comments: Cues for technqiue and assist to help LLE clear EOB; Light mod handheld assist to pull to sit    Transfers Overall transfer level: Needs assistance Equipment used: Rolling walker (2 wheels) Transfers: Sit to/from Stand, Bed to chair/wheelchair/BSC Sit to Stand: Mod assist, +2 safety/equipment   Step pivot transfers: Mod assist, +2 safety/equipment       General transfer comment: Light mod assist to power up to stand; good hand placement; pivot steps bed to [facility] with RW; cues for 50%PWB LLE -- in a slight ruch to get to commode, so unable to get  a close look for checking on 50% PWB, though did note pt bearing down into RW well     Ambulation/Gait                  Stairs            Wheelchair Mobility    Modified Rankin (Stroke Patients Only)       Balance Overall balance assessment: Needs assistance   Sitting balance-Leahy Scale: Fair (approaching Good)       Standing balance-Leahy Scale: Poor                               Pertinent Vitals/Pain Pain Assessment Pain Assessment: Faces Faces Pain Scale: Hurts little more Pain Location: L knee Pain Descriptors / Indicators: Grimacing Pain Intervention(s): Monitored during session    Home Living Family/patient expects to be discharged to:: Private residence Living Arrangements: Alone Available Help at Discharge: Personal care attendant;Family Type of Home: Apartment Home Access: Level entry       Home Layout: One level Home Equipment: Conservation officer, nature (2 wheels);Cane - quad;Shower seat;Grab bars - toilet;Grab bars - tub/shower Additional Comments: pt with PCA 5 days/week to assist wtih ADLs/IADLs as needed. family support available    Prior Function Prior Level of Function : Independent/Modified Independent             Mobility Comments: rollator in home ADLs Comments: A for IADLs of laundry, cleaning and transportation. pt was mod indep with use of DME/long handled tools with     Hand Dominance        Extremity/Trunk Assessment   Upper Extremity Assessment Upper Extremity Assessment: Overall WFL for tasks assessed    Lower Extremity Assessment Lower Extremity Assessment: LLE deficits/detail LLE Deficits / Details: knee immobilized in KI, Hip and ankle ROM functionally adequate; Pain with gentle (50%) weight bearing, but pt tolerated well       Communication   Communication: No difficulties  Cognition Arousal/Alertness: Awake/alert Behavior During Therapy: WFL for tasks assessed/performed Overall Cognitive Status: Within Functional Limits for tasks assessed (for simple mobility tasks)                                  General Comments: Oriented x4        General Comments General comments (skin integrity, edema, etc.): Educated on KI wear, RW use, 50%PWB    Exercises     Assessment/Plan    PT Assessment Patient needs continued PT services  PT Problem List Decreased strength;Decreased range of motion;Decreased activity tolerance;Decreased balance;Decreased mobility;Decreased knowledge of precautions;Obesity;Pain       PT Treatment Interventions DME instruction;Gait training;Functional mobility training;Therapeutic activities;Therapeutic exercise;Balance training;Patient/family education    PT Goals (Current goals can be found in the Care Plan section)  Acute Rehab PT Goals Patient Stated Goal: Very much wants to go to CIR PT Goal Formulation: With patient Time For Goal Achievement: 08/15/21 Potential to Achieve Goals: Good    Frequency Min 2X/week     Co-evaluation               AM-PAC PT "6 Clicks" Mobility  Outcome Measure Help needed turning from your back to your side while in a flat bed without using bedrails?: A Little Help needed moving from lying on your back to sitting on the side of a flat bed without using bedrails?: A Lot  Help needed moving to and from a bed to a chair (including a wheelchair)?: Total Help needed standing up from a chair using your arms (e.g., wheelchair or bedside chair)?: Total Help needed to walk in hospital room?: Total Help needed climbing 3-5 steps with a railing? : Total 6 Click Score: 9    End of Session Equipment Utilized During Treatment: Gait belt;Left knee immobilizer Activity Tolerance: Patient tolerated treatment well Patient left: in chair;with call bell/phone within reach;with chair alarm set Nurse Communication: Mobility status PT Visit Diagnosis: Unsteadiness on feet (R26.81);Muscle weakness (generalized) (M62.81);History of falling (Z91.81)    Time: 2119-4174 PT Time Calculation (min) (ACUTE  ONLY): 25 min   Charges:   PT Evaluation $PT Eval Moderate Complexity: 1 Mod PT Treatments $Therapeutic Activity: 8-22 mins        Roney Marion, PT  Acute Rehabilitation Services Office [phone]   Colletta [place] 08/01/2021, 4:32 PM

## 2021-08-01 NOTE — Op Note (Signed)
NAME: KEYONA, EMRICH MEDICAL RECORD NO: [id number] ACCOUNT NO: [id number] DATE OF BIRTH: 06/20/1960 FACILITY: [facility] LOCATION: [facility] PHYSICIAN: Yetta Barre. Marlou Sa, MD  Operative Report   DATE OF PROCEDURE: 07/31/2021  PREOPERATIVE DIAGNOSIS:  Left medial epicondyle periprosthetic femur fracture.  POSTOPERATIVE DIAGNOSIS:  Left medial epicondyle periprosthetic femur fracture.  PROCEDURE:  Open reduction and internal fixation of left periprosthetic medial femoral condyle fracture using Smith and Nephew plate.  SURGEON:  Meredith Pel, MD  ASSISTANT:  Annie Main, PA  INDICATIONS:  The patient is a 61 year old patient with left knee pain following a fall 10 days ago.  She presents now for operative management after explanation of risks and benefits.  DESCRIPTION OF PROCEDURE:  The patient was brought to the operating room where preoperative IV antibiotics were administered.  Timeout was called.  Left leg was pre-scrubbed with alcohol and Betadine, allowed to air dry, prepped with DuraPrep solution  and draped in sterile manner.  Ioban used to cover the operative field.  TXA was not utilized.  Tourniquet was inflated to 300 mmHg. The leg was elevated and exsanguinated with the Esmarch wrap.  Tourniquet was inflated.  Incision made over the medial  epicondyle extending proximally.  Skin and subcutaneous tissue sharply divided.  The medial epicondyle was identified.  The VMO was identified.  The fascia between the VMO and the adductor tubercle and adductor tendon was elevated.  The fracture site was  visualized and not disturbed.  A plate was applied, which reduced the fracture nicely once screws were placed, 5 screws placed proximally, 4 screws which were locking screws placed in the distal fragment.  Fluoroscopy used to confirm good placement and  good reduction of the fracture.  The patient had good stability to varus and valgus stress after the fixation.  A thorough irrigation was  performed.  Tourniquet was released, bleeding points encountered controlled using electrocautery.  The patient's  incision was closed using #1 Vicryl suture followed by interrupted inverted 0 Vicryl suture, 2-0 Vicryl suture, and 3-0 Monocryl, Steri-Strips and Ace wraps.  Luke's assistance was required at all times during the case for retraction, opening, closing,  mobilization of tissue, placing screws.  His assistance was a medical necessity.   SHW D: 07/31/2021 6:50:07 pm T: 07/31/2021 10:00:00 pm  JOB: [id number]/ [id number]

## 2021-08-01 NOTE — Progress Notes (Signed)
Inpatient Rehab Admissions Coordinator:   CIR consult received. Unfortunately, pt  does not meed medical necessity criteria for CIR with a TKA. Additionally, Pt.'s insurance will not approve this diagnosis. I will not pursue CIR admit for this Pt. Recommend TOC look for other rehab venues. Please contact me any with questions.  Clemens Catholic, [place], [place] Admissions Coordinator  [phone] ([place]) [phone] (office)

## 2021-08-01 NOTE — Inpatient Diabetes Management (Signed)
Inpatient Diabetes Program Recommendations  AACE/ADA: New Consensus Statement on Inpatient Glycemic Control   Target Ranges:  Prepandial:   less than 140 mg/dL      Peak postprandial:   less than 180 mg/dL (1-2 hours)      Critically ill patients:  140 - 180 mg/dL    Latest Reference Range & Units 07/31/21 13:22 07/31/21 15:12 07/31/21 18:54  Glucose-Capillary 70 - 99 mg/dL 113 (H) 118 (H) 118 (H)   Review of Glycemic Control  Diabetes history: DM2 Outpatient Diabetes medications: Metformin XR 1000 mg QHS Current orders for Inpatient glycemic control: Metformi XR 1000 mg QHS  Inpatient Diabetes Program Recommendations:    Insulin: Please consider ordering CBGs and Novolog 0-9 units TID with meals and Novolog 0-5 units QHS.  NOTE: Noted patient had surgery yesterday and received Decadron 5 mg at 14:41 and Decadron 4 mg at 16:50 on 07/31/21. Not CBGs in chart for today.   Thanks, Barnie Alderman, RN, MSN, [place] Diabetes Coordinator Inpatient Diabetes Program [phone] (Team Pager from 8am to [place])

## 2021-08-02 ENCOUNTER — Inpatient Hospital Stay (HOSPITAL_COMMUNITY): Payer: Medicare Other

## 2021-08-02 DIAGNOSIS — M7989 Other specified soft tissue disorders: Secondary | ICD-10-CM

## 2021-08-02 MED ORDER — OXYCODONE HCL ER 10 MG PO T12A
10.0000 mg | EXTENDED_RELEASE_TABLET | Freq: Two times a day (BID) | ORAL | Status: DC
  Administered 2021-08-02 – 2021-08-06 (×9): 10 mg via ORAL
  Filled 2021-08-02 (×9): qty 1

## 2021-08-02 NOTE — NC FL2 (Signed)
[place] LEVEL OF CARE SCREENING TOOL     IDENTIFICATION  Patient Name: [name] Birthdate: 1960/07/08 Sex: female Admission Date (Current Location): 07/31/2021  [place] and [place] Number:  Kathleen Argue [id number] [place] and Address:  [facility], [place] [address] [place], [place] [address]      Provider Number: [id number]  Attending Physician Name and Address:  Meredith Pel, MD  Relative Name and Phone Number:  Vee, Bahe   [phone]    Current Level of Care: Hospital Recommended Level of Care: [place] Prior Approval Number:    Date Approved/Denied:   PASRR Number: [id number]  Discharge Plan: SNF    Current Diagnoses: Patient Active Problem List   Diagnosis Date Noted   Closed fracture of distal end of femur, unspecified fracture morphology, unspecified laterality, initial encounter ([place]) 07/31/2021   Nasolacrimal duct obstruction, acquired, bilateral 03/23/2021   Chronic pain disorder 03/23/2021   Dacrocystitis, left 11/20/2020   Cellulitis, face 11/20/2020   Well adult exam 07/19/2020   Benign neoplasm of ascending colon    Benign neoplasm of descending colon    Benign neoplasm of sigmoid colon    Chronic diarrhea    Diarrhea 07/06/2019   Shoulder pain, bilateral 02/01/2019   Physical deconditioning 01/28/2019   Chronic anticoagulation 11/19/2018   Insulin dependent diabetes mellitus type IA ([place]) 11/19/2018   Oxygen dependent 11/19/2018   Family history of colon cancer 10/26/2018   Myringotomy tube status 08/21/2018   Vaginal candidiasis 07/15/2018   Nasal crusting 06/12/2018   Chronic serous otitis media of left ear 06/04/2018   Mixed conductive and sensorineural hearing loss of left ear with restricted hearing of right ear 06/04/2018   Chronic cholecystitis with calculus 02/11/2018   Thrush, oral 01/15/2018   Nasal sinus congestion 01/15/2018   Dyslipidemia  10/28/2017   Pruritus 10/23/2017   Abnormal liver function test    Anemia 09/24/2017   Migraines 09/17/2017   Cholecystitis, acute 09/12/2017   COPD (chronic obstructive pulmonary disease) ([place]) 58/85/0277   Diastolic dysfunction 41/28/7867   OSA (obstructive sleep apnea) 09/30/2016   Chronic respiratory failure with hypoxia ([place]) 08/23/2016   Pulmonary embolism ([place]) 07/21/2016   DVT (deep venous thrombosis) ([place]) 07/21/2016   Acute bronchitis 01/30/2016   Weight gain 12/20/2015   Controlled type 2 diabetes mellitus with chronic kidney disease, without long-term current use of insulin ([place]) 02/01/2015   Abnormal SPEP 06/04/2013   Memory loss 04/27/2013   Panic attacks 03/17/2013   Rash 10/27/2012   Pulmonary nodules 07/20/2012   CAD in native artery 05/28/2012   Seborrheic dermatitis 01/20/2012   Obesity, Class III, BMI 40-49.9 (morbid obesity) ([place]) 03/07/2010   INSOMNIA, CHRONIC 03/07/2010   Allergic rhinitis 10/26/2009   Grief reaction 08/01/2009   Essential hypertension 08/27/2007   B12 deficiency 08/25/2007   Vitamin D deficiency 08/25/2007   Urinary tract infection 02/07/2007   SINUSITIS, ACUTE 02/02/2007   Osteoarthritis 02/02/2007   Sarcoidosis 08/14/2006   Anxiety disorder 08/14/2006   Major depressive disorder, recurrent episode ([place]) 08/14/2006   GERD (gastroesophageal reflux disease) 08/14/2006    Orientation RESPIRATION BLADDER Height & Weight     Self, Time, Situation, Place  O2 Continent Weight: 295 lb 10.2 oz (134.1 kg) Height:  '5\' 7"'$  (170.2 cm)  BEHAVIORAL SYMPTOMS/MOOD NEUROLOGICAL BOWEL NUTRITION STATUS      Continent Diet (see discharge summary)  AMBULATORY STATUS COMMUNICATION OF NEEDS Skin   Total Care Verbally Surgical wounds  Personal Care Assistance Level of Assistance  Bathing, Feeding, Dressing Bathing Assistance: Limited assistance Feeding assistance: Independent Dressing Assistance: Limited assistance      Functional Limitations Info  Sight, Hearing, Speech Sight Info: Adequate Hearing Info: Adequate Speech Info: Adequate    SPECIAL CARE FACTORS FREQUENCY  PT (By licensed PT), OT (By licensed OT)     PT Frequency: 5x week OT Frequency: 5x week            Contractures Contractures Info: Not present    Additional Factors Info  Code Status, Allergies Code Status Info: full Allergies Info: Belsomra (Suvorexant), Enalapril Maleate, Latex, Nickel, Other, Morphine And Related, Doxycycline, Hydrochlorothiazide, Hydroxychloroquine Sulfate, Lyrica (Pregabalin), Tape           Current Medications (08/02/2021):  This is the current hospital active medication list Current Facility-Administered Medications  Medication Dose Route Frequency Provider Last Rate Last Admin   acetaminophen (TYLENOL) tablet 325-650 mg  325-650 mg Oral Q6H PRN Magnant, Charles L, PA-C       albuterol (PROVENTIL) (2.5 MG/3ML) 0.083% nebulizer solution 2.5 mg  2.5 mg Nebulization Q6H PRN Meredith Pel, MD       carvedilol (COREG) tablet 12.5 mg  12.5 mg Oral BID WC Magnant, Charles L, PA-C   12.5 mg at 08/02/21 1026   docusate sodium (COLACE) capsule 100 mg  100 mg Oral BID Magnant, Charles L, PA-C   100 mg at 08/02/21 1027   gabapentin (NEURONTIN) capsule 300 mg  300 mg Oral BID Magnant, Charles L, PA-C   300 mg at 08/02/21 1028   HYDROmorphone (DILAUDID) injection 0.5 mg  0.5 mg Intravenous Q4H PRN Magnant, Charles L, PA-C   0.5 mg at 08/02/21 0005   metFORMIN (GLUCOPHAGE-XR) 24 hr tablet 1,000 mg  1,000 mg Oral QHS Magnant, Charles L, PA-C   1,000 mg at 08/01/21 2148   methocarbamol (ROBAXIN) tablet 500 mg  500 mg Oral Q6H PRN Magnant, Charles L, PA-C   500 mg at 08/02/21 0981   Or   methocarbamol (ROBAXIN) 500 mg in dextrose 5 % 50 mL IVPB  500 mg Intravenous Q6H PRN Magnant, Charles L, PA-C       metoCLOPramide (REGLAN) tablet 5-10 mg  5-10 mg Oral Q8H PRN Magnant, Charles L, PA-C       Or    metoCLOPramide (REGLAN) injection 5-10 mg  5-10 mg Intravenous Q8H PRN Magnant, Charles L, PA-C       ondansetron (ZOFRAN) tablet 4 mg  4 mg Oral Q6H PRN Magnant, Charles L, PA-C       Or   ondansetron (ZOFRAN) injection 4 mg  4 mg Intravenous Q6H PRN Magnant, Charles L, PA-C       oxyCODONE (Oxy IR/ROXICODONE) immediate release tablet 10 mg  10 mg Oral Q4H Magnant, Charles L, PA-C   10 mg at 08/02/21 1026   oxyCODONE (OXYCONTIN) 12 hr tablet 10 mg  10 mg Oral Q12H Magnant, Charles L, PA-C   10 mg at 08/02/21 1027   pantoprazole (PROTONIX) EC tablet 40 mg  40 mg Oral Daily Magnant, Charles L, PA-C   40 mg at 08/02/21 1028   QUEtiapine (SEROQUEL) tablet 25 mg  25 mg Oral q morning Magnant, Charles L, PA-C   25 mg at 08/02/21 1028   QUEtiapine (SEROQUEL) tablet 50 mg  50 mg Oral QHS Magnant, Charles L, PA-C   50 mg at 08/01/21 2148   rivaroxaban (XARELTO) tablet 20 mg  20 mg Oral Daily Magnant,  Charles L, PA-C   20 mg at 08/02/21 1028   topiramate (TOPAMAX) tablet 50 mg  50 mg Oral BID Magnant, Charles L, PA-C   50 mg at 08/02/21 1027   venlafaxine XR (EFFEXOR-XR) 24 hr capsule 75 mg  75 mg Oral Q breakfast Magnant, Charles L, PA-C   75 mg at 08/02/21 1028     Discharge Medications: Please see discharge summary for a list of discharge medications.  Relevant Imaging Results:  Relevant Lab Results:   Additional Information SS#: [id number], pt is vaccianted for covid with one booster.  Joanne Chars, LCSW

## 2021-08-02 NOTE — Progress Notes (Signed)
  Subjective: Patient is a 61 year old female who presents s/p left medial condyle periprosthetic femur fracture ORIF.  She is POD 2.  Pain is controlled but complains of increased pain compared with yesterday.  She has not ambulated yet; she was able to transfer with PT yesterday.  Denies any fevers, chills, night sweats, chest pain, shortness of breath, calf pain.  She was denied for inpatient rehab; plan for discharge to SNF  Objective: Vital signs in last 24 hours: Temp:  [98.3 F (36.8 C)-99.5 F (37.5 C)] 99.5 F (37.5 C) (07/20 0834) Pulse Rate:  [70-79] 70 (07/20 0834) Resp:  [18-19] 18 (07/20 0834) BP: (144-174)/(64-79) 174/77 (07/20 0834) SpO2:  [95 %-100 %] 100 % (07/20 0834)  Intake/Output from previous day: 07/19 0701 - 07/20 0700 In: 415.3 [P.O.:120; IV Piggyback:295.3] Out: -  Intake/Output this shift: No intake/output data recorded.  Exam:  Ortho exam demonstrates left knee with dressing intact with no gross blood or drainage.  Small amount of bloody spotting noted at the proximal aspect of the bandage.  Positive calf tenderness on left, negative on right.  Positive Homans' sign on left, negative on right.  2+ DP pulse of the operative extremity.  Intact ankle dorsiflexion, plantarflexion, EHL.    Labs: Recent Labs    07/31/21 1327  HGB 12.3   Recent Labs    07/31/21 1327  WBC 7.2  RBC 3.88  HCT 38.6  PLT 198   Recent Labs    07/31/21 1327  NA 144  K 3.9  CL 107  CO2 22  BUN 22  CREATININE 0.88  GLUCOSE 115*  CALCIUM 9.4   No results for input(s): "LABPT", "INR" in the last 72 hours.  Assessment/Plan: Plan is mobilize with physical therapy.  She is okay for 50% partial weightbearing with walker and knee immobilizer.  Plan for discharge to skilled nursing facility, she does not want to go to [facility].  Okay to start knee range of motion today.  Check ultrasound today to rule out DVT with her increased calf pain this morning.   Quindarrius [place] L  Shelbie Franken 08/02/2021, 8:40 AM

## 2021-08-02 NOTE — TOC Initial Note (Signed)
Transition of Care [facility]) - Initial/Assessment Note    Patient Details  Name: [name] MRN: [id number] Date of Birth: May 30, 1960  Transition of Care [facility]) CM/SW Contact:    Joanne Chars, LCSW Phone Number: 08/02/2021, 11:09 AM  Clinical Narrative:     CSW spoke with pt regarding PT recommendation for SNF.  Pt agreeable, choice document given, permission given to send out referral in hub.  Pt has been to [place] several times and does not want to go there, would like SNF on this side of town near her son.  Permission given to speak with son [name].  Pt lives alone, has [place] [facility] aide for one hour per day in evening, M-F.  Pt is vaccinated for covid with one booster.  Referral sent out in hub for SNF.               Expected Discharge Plan: Skilled Nursing Facility Barriers to Discharge: Continued Medical Work up, SNF Pending bed offer   Patient Goals and CMS Choice Patient states their goals for this hospitalization and ongoing recovery are:: "walk without pain." CMS Medicare.gov Compare Post Acute Care list provided to:: Patient Choice offered to / list presented to : Patient  Expected Discharge Plan and Services Expected Discharge Plan: [place] In-house Referral: Clinical Social Work   Post Acute Care Choice: [place] Living arrangements for the past 2 months: [place]                                      Prior Living Arrangements/Services Living arrangements for the past 2 months: Single Family Home Lives with:: Self Patient language and need for interpreter reviewed:: Yes Do you feel safe going back to the place where you live?: Yes      Need for Family Participation in Patient Care: Yes (Comment) Care giver support system in place?: Yes (comment) Current home services: Homehealth aide ([facility] aide M-F for one hour per day) Criminal Activity/Legal Involvement Pertinent to Current Situation/Hospitalization: No -  Comment as needed  Activities of Daily Living Home Assistive Devices/Equipment: Eyeglasses, Blood pressure cuff, CBG Meter, Grab bars in shower, Oxygen, Wheelchair ADL Screening (condition at time of admission) Patient's cognitive ability adequate to safely complete daily activities?: Yes Is the patient deaf or have difficulty hearing?: No Does the patient have difficulty seeing, even when wearing glasses/contacts?: No Does the patient have difficulty concentrating, remembering, or making decisions?: No Patient able to express need for assistance with ADLs?: Yes Does the patient have difficulty dressing or bathing?: No Independently performs ADLs?: Yes (appropriate for developmental age) Does the patient have difficulty walking or climbing stairs?: Yes Weakness of Legs: Both Weakness of Arms/Hands: None  Permission Sought/Granted Permission sought to share information with : Family Supports Permission granted to share information with : Yes, Verbal Permission Granted  Share Information with NAME: son [name]  Permission granted to share info w AGENCY: SNF        Emotional Assessment Appearance:: Appears older than stated age Attitude/Demeanor/Rapport: Engaged Affect (typically observed): Appropriate, Pleasant Orientation: : Oriented to Self, Oriented to Place, Oriented to  Time, Oriented to Situation Alcohol / Substance Use: Not Applicable Psych Involvement: Outpatient Provider  Admission diagnosis:  Closed fracture of distal end of femur, unspecified fracture morphology, unspecified laterality, initial encounter [facility]) [S72.409A] Patient Active Problem List   Diagnosis Date Noted   Closed fracture of  distal end of femur, unspecified fracture morphology, unspecified laterality, initial encounter ([place]) 07/31/2021   Nasolacrimal duct obstruction, acquired, bilateral 03/23/2021   Chronic pain disorder 03/23/2021   Dacrocystitis, left 11/20/2020   Cellulitis, face 11/20/2020   Well  adult exam 07/19/2020   Benign neoplasm of ascending colon    Benign neoplasm of descending colon    Benign neoplasm of sigmoid colon    Chronic diarrhea    Diarrhea 07/06/2019   Shoulder pain, bilateral 02/01/2019   Physical deconditioning 01/28/2019   Chronic anticoagulation 11/19/2018   Insulin dependent diabetes mellitus type IA ([place]) 11/19/2018   Oxygen dependent 11/19/2018   Family history of colon cancer 10/26/2018   Myringotomy tube status 08/21/2018   Vaginal candidiasis 07/15/2018   Nasal crusting 06/12/2018   Chronic serous otitis media of left ear 06/04/2018   Mixed conductive and sensorineural hearing loss of left ear with restricted hearing of right ear 06/04/2018   Chronic cholecystitis with calculus 02/11/2018   Thrush, oral 01/15/2018   Nasal sinus congestion 01/15/2018   Dyslipidemia 10/28/2017   Pruritus 10/23/2017   Abnormal liver function test    Anemia 09/24/2017   Migraines 09/17/2017   Cholecystitis, acute 09/12/2017   COPD (chronic obstructive pulmonary disease) ([place]) 34/19/6222   Diastolic dysfunction 97/98/9211   OSA (obstructive sleep apnea) 09/30/2016   Chronic respiratory failure with hypoxia ([place]) 08/23/2016   Pulmonary embolism ([place]) 07/21/2016   DVT (deep venous thrombosis) ([place]) 07/21/2016   Acute bronchitis 01/30/2016   Weight gain 12/20/2015   Controlled type 2 diabetes mellitus with chronic kidney disease, without long-term current use of insulin ([place]) 02/01/2015   Abnormal SPEP 06/04/2013   Memory loss 04/27/2013   Panic attacks 03/17/2013   Rash 10/27/2012   Pulmonary nodules 07/20/2012   CAD in native artery 05/28/2012   Seborrheic dermatitis 01/20/2012   Obesity, Class III, BMI 40-49.9 (morbid obesity) ([place]) 03/07/2010   INSOMNIA, CHRONIC 03/07/2010   Allergic rhinitis 10/26/2009   Grief reaction 08/01/2009   Essential hypertension 08/27/2007   B12 deficiency 08/25/2007   Vitamin D deficiency 08/25/2007   Urinary tract  infection 02/07/2007   SINUSITIS, ACUTE 02/02/2007   Osteoarthritis 02/02/2007   Sarcoidosis 08/14/2006   Anxiety disorder 08/14/2006   Major depressive disorder, recurrent episode ([place]) 08/14/2006   GERD (gastroesophageal reflux disease) 08/14/2006   PCP:  Cassandria Anger, MD Pharmacy:   [facility] [address] [place] [address] Phone: [phone] Fax: [phone]     Social Determinants of Health (SDOH) Interventions    Readmission Risk Interventions     No data to display

## 2021-08-02 NOTE — Progress Notes (Signed)
LLE venous duplex has been completed.   Results can be found under chart review under CV PROC. 08/02/2021 12:43 PM Omario Ander RVT, RDMS

## 2021-08-03 NOTE — Consult Note (Signed)
   [facility] Inpatient Consult   08/03/2021  [place] Apr 06, 1960 [id number]  [facility] [ACO] Patient: [employer]  Primary Care Provider:  Plotnikov, Evie Lacks, MD,  [place] is an Embedded provider that can follow for Chronic care management or care coordination  Patient screened for hospitalization with noted history with [facility] RN Coach.  Review of patient's medical record reveals patient is currently recommended for a skilled nursing facility level of care.   Plan:  Continue to follow progress and disposition to assess for post hospital care management needs.    For questions contact:   Natividad Brood, RN BSN [facility] Liaison  [phone] business mobile phone Toll free office [phone]  Fax number: [phone] [email or web address]

## 2021-08-03 NOTE — Evaluation (Addendum)
Physical Therapy Re-Evaluation Patient Details Name: [name] MRN: [id number] DOB: [birth year] Today's Date: 08/03/2021  History of Present Illness  [name] is a 61 year old patient with left leg pain after a fall earlyJuly resulting in a L periprosthetic (prev TKA) distal femur fx; s/p surgical repair, KI at all times, 50% PWB, no ROM knee;  has a past medical history of Depression, Diabetes mellitus without complication ([place]), DYSPNEA (04/28/2009), Fibromyalgia, Headache, History of shingles, Hypertension, Insomnia, Long-term memory loss, OSA (obstructive sleep apnea), Osteoarthritis, Panic attacks, Rheumatoid arthritis ([place]), Sarcoidosis, Stroke ([place]), and Vitamin D deficiency.  Clinical Impression  New physical therapy orders received on this date. Pt agreeable to physical therapy session but only willing to perform transfer from bed to chair which required minimal to moderate assistance x 2 with RW. Pt limited by pain and muscle spasms. Pt not willing to perform therapeutic exercises this session but reports she will have CPM on later. Will progress as tolerated. Pt will continue to benefit from skilled, acute care physical therapy interventions including transfer, balance, and gait training to maximize her current level of function and progress towards established goals.      Recommendations for follow up therapy are one component of a multi-disciplinary discharge planning process, led by the attending physician.  Recommendations may be updated based on patient status, additional functional criteria and insurance authorization.  Follow Up Recommendations Skilled nursing-short term rehab (<3 hours/day) (for post-acute rehab) Can patient physically be transported by private vehicle: No    Assistance Recommended at Discharge Frequent or constant Supervision/Assistance  Patient can return home with the following  A lot of help with walking and/or transfers;Two people to help with  bathing/dressing/bathroom;Assistance with cooking/housework;Assist for transportation    Equipment Recommendations Other (comment) (Well-equipped)  Recommendations for Other Services       Functional Status Assessment       Precautions / Restrictions Precautions Precautions: Fall Precaution Comments: urinary incontience (purewick in place) Required Braces or Orthoses: Knee Immobilizer - Left Knee Immobilizer - Left: On except when in CPM Restrictions Weight Bearing Restrictions: Yes LLE Weight Bearing: Touchdown weight bearing (for transfers)      Mobility  Bed Mobility Overal bed mobility: Needs Assistance Bed Mobility: Supine to Sit     Supine to sit: Min assist     General bed mobility comments: Pt required minimal assistance for L LE management and reported muscle spasms in left quads when sitting at EOB. Pt also reported lightheadedness when sitting EOB w/ BP stable.    Transfers Overall transfer level: Needs assistance Equipment used: Rolling walker (2 wheels) Transfers: Sit to/from Stand, Bed to chair/wheelchair/BSC Sit to Stand: Mod assist, +2 safety/equipment, From elevated surface   Step pivot transfers: +2 safety/equipment, Min assist       General transfer comment: Pt able to maintain WB status when performing sit to stand and stand pivot transfer to chair. Pt had to keep R LE outside of RW when going to stand due to her body habitus and not having bariatric RW available. Pt utilized chair armrest on R for sit to stand transition. Pt with no LOB. Min verbal cues for technique and safety awareness.    Ambulation/Gait                  Stairs            Wheelchair Mobility    Modified Rankin (Stroke Patients Only)       Balance Overall balance assessment: Needs assistance  Sitting balance-Leahy Scale: Fair (approaching Good)     Standing balance support: Bilateral upper extremity supported Standing balance-Leahy Scale: Poor                                Pertinent Vitals/Pain Pain Assessment Pain Assessment: 0-10 Pain Score: 7  Faces Pain Scale: Hurts little more Pain Location: L knee Pain Descriptors / Indicators: Grimacing, Guarding Pain Intervention(s): Limited activity within patient's tolerance, Monitored during session, Patient requesting pain meds-RN notified    Home Living                          Prior Function                       Hand Dominance        Extremity/Trunk Assessment                Communication      Cognition Arousal/Alertness: Awake/alert Behavior During Therapy: WFL for tasks assessed/performed Overall Cognitive Status: Within Functional Limits for tasks assessed (for simple mobility tasks)                                 General Comments: Pt A and O        General Comments General comments (skin integrity, edema, etc.): 96% SpO2 and BP of 132/73 on 2L O2    Exercises     Assessment/Plan    PT Assessment    PT Problem List         PT Treatment Interventions      PT Goals (Current goals can be found in the Care Plan section)  Acute Rehab PT Goals Patient Stated Goal: Very much wants to go to CIR PT Goal Formulation: With patient Time For Goal Achievement: 08/15/21 Potential to Achieve Goals: Good    Frequency Min 3X/week     Co-evaluation               AM-PAC PT "6 Clicks" Mobility  Outcome Measure Help needed turning from your back to your side while in a flat bed without using bedrails?: A Little Help needed moving from lying on your back to sitting on the side of a flat bed without using bedrails?: A Little Help needed moving to and from a bed to a chair (including a wheelchair)?: Total Help needed standing up from a chair using your arms (e.g., wheelchair or bedside chair)?: Total Help needed to walk in hospital room?: Total Help needed climbing 3-5 steps with a railing? : Total 6 Click  Score: 10    End of Session Equipment Utilized During Treatment: Gait belt;Left knee immobilizer Activity Tolerance: Patient limited by pain;Patient limited by fatigue Patient left: in chair;with call bell/phone within reach;with chair alarm set Nurse Communication: Mobility status;Patient requests pain meds;Weight bearing status PT Visit Diagnosis: Unsteadiness on feet (R26.81);Muscle weakness (generalized) (M62.81);History of falling (Z91.81)    Time: 2426-8341 PT Time Calculation (min) (ACUTE ONLY): 21 min   Charges:   PT Evaluation $PT Re-evaluation: 1 Re-eval          Donna Bernard, PT   [facility] 08/03/2021, 3:11 PM

## 2021-08-03 NOTE — Progress Notes (Signed)
  Subjective: Patient stable.  Having some pain. Pain medicine is overall controlling her symptoms  Objective: Vital signs in last 24 hours: Temp:  [98.4 F (36.9 C)-99.5 F (37.5 C)] 98.4 F (36.9 C) (07/20 2229) Pulse Rate:  [70-74] 71 (07/20 2229) Resp:  [16-18] 16 (07/20 2229) BP: (155-174)/(73-79) 155/79 (07/20 2229) SpO2:  [97 %-100 %] 97 % (07/20 2229)  Intake/Output from previous day: 07/20 0701 - 07/21 0700 In: 120 [P.O.:120] Out: 1600 [Urine:1600] Intake/Output this shift: No intake/output data recorded.  Exam: Left foot perfused and sensate and mobile.  Incision dressed. abs: Recent Labs    07/31/21 1327  HGB 12.3   Recent Labs    07/31/21 1327  WBC 7.2  RBC 3.88  HCT 38.6  PLT 198   Recent Labs    07/31/21 1327  NA 144  K 3.9  CL 107  CO2 22  BUN 22  CREATININE 0.88  GLUCOSE 115*  CALCIUM 9.4   No results for input(s): "LABPT", "INR" in the last 72 hours.  Assessment/Plan: [name] is discharged to skilled nursing facility once bed is available.  Patient is ready from orthopedic standpoint.  CPM machine today.  Ultrasound negative for DVT.  Continue with preoperative anticoagulant.  Touchdown weightbearing for transfers only.   [name] 08/03/2021, 7:28 AM

## 2021-08-03 NOTE — TOC Progression Note (Addendum)
Transition of Care [facility]) - Progression Note    Patient Details  Name: Jaclynn Laumann MRN: [id number] Date of Birth: Apr 01, 1960  [facility] of [facility]) CM/SW Contact  Joanne Chars, LCSW Phone Number: 08/03/2021, 11:00 AM  Clinical Narrative:   Bed offers presented to pt.  She is requesting responses from [facility], [facility], [place].  CSW reached out to [employer] and [facility] and requested they review the referral.  CSW already [facility], [facility]ine Baton off today.  74: CSW spoke with [facility]--her admin is reviewing the referral.  She reports [facility] is full.  No response from [employer].  1430: [facility] declines.   CSW asked [facility] to review.  1545: CSW updated pt, discussed that if no other offers come in, would need to choose from her current offers, pt states she would be OK with [facility], would still like to see if other offers come.   Expected Discharge Plan: [place] Barriers to Discharge: Continued Medical Work up, SNF Pending bed offer  Expected Discharge Plan and Services Expected Discharge Plan: [place] In-house Referral: Clinical Social Work   Post Acute Care Choice: [place] Living arrangements for the past 2 months: Single Family Home                                       Social Determinants of Health (SDOH) Interventions    Readmission Risk Interventions     No data to display

## 2021-08-03 NOTE — Progress Notes (Signed)
Orthopedic Tech Progress Note Patient Details:  [name] [name] 1960-04-01 [id number]  CPM Left Knee CPM Left Knee: On Left Knee Flexion (Degrees): 25 Left Knee Extension (Degrees): 0 Additional Comments: Unable to tolerate anything above 25 degrees  Post Interventions Patient Tolerated: Fair  [name] 08/03/2021, 10:17 AM

## 2021-08-03 NOTE — Care Management Important Message (Signed)
Important Message  Patient Details  Name: [name] MRN: [id number] Date of Birth: November 16, 1960   Medicare Important Message Given:  Yes     Alquan Morrish Montine Circle 08/03/2021, 4:13 PM

## 2021-08-04 NOTE — Progress Notes (Signed)
  Subjective: Patient stable.  Left knee pain is improving.  Spent time on CPM machine yesterday.  Placement pending   Objective: Vital signs in last 24 hours: Temp:  [98.1 F (36.7 C)-98.6 F (37 C)] 98.6 F (37 C) (07/22 0528) Pulse Rate:  [72-85] 85 (07/22 0528) Resp:  [16-18] 18 (07/22 0528) BP: (122-131)/(54-68) 122/68 (07/22 0528) SpO2:  [71 %-99 %] 71 % (07/22 0528)  Intake/Output from previous day: 07/21 0701 - 07/22 0700 In: -  Out: 1250 [Urine:1250] Intake/Output this shift: No intake/output data recorded.  Exam:  Sensation intact distally Intact pulses distally Dorsiflexion/Plantar flexion intact  Labs: No results for input(s): "HGB" in the last 72 hours. No results for input(s): "WBC", "RBC", "HCT", "PLT" in the last 72 hours. No results for input(s): "NA", "K", "CL", "CO2", "BUN", "CREATININE", "GLUCOSE", "CALCIUM" in the last 72 hours. No results for input(s): "LABPT", "INR" in the last 72 hours.  Assessment/Plan: Plan at this time is discharge to skilled nursing once bed becomes available.  From orthopedic standpoint patient is ready for discharge.   Landry Dyke Doron Shake 08/04/2021, 8:27 AM

## 2021-08-04 NOTE — Progress Notes (Signed)
Orthopedic Tech Progress Note Patient Details:  [name] [facility] Jan 25, 1960 [id number]  CPM Left Knee CPM Left Knee: On Left Knee Flexion (Degrees): 25 Left Knee Extension (Degrees): 0 Additional Comments: this is all the flexion patient would allow  Post Interventions Patient Tolerated: Well Instructions Provided: Care of device, Adjustment of device  [name] 08/04/2021, 8:16 PM

## 2021-08-04 NOTE — Plan of Care (Signed)
  Problem: Coping: Goal: Level of anxiety will decrease Outcome: Progressing   Problem: Elimination: Goal: Will not experience complications related to bowel motility Outcome: Progressing Goal: Will not experience complications related to urinary retention Outcome: Progressing   Problem: Pain Managment: Goal: General experience of comfort will improve Outcome: Progressing   

## 2021-08-05 NOTE — Progress Notes (Signed)
Patient doing well.  Vital signs stable.  Denies any complaint of chest pain or current shortness of breath.  The pain is overall controlled; she has been using the CPM machine on and off.  Knee pain is improving compared with last several days.  No fevers or chills.  On exam, she has 2+ DP pulse of the operative extremity.  Dressing intact with no new gross blood or drainage.  No calf tenderness bilaterally.  Negative Homans' sign.  Discharge pending insurance authorization to skilled nursing facility.

## 2021-08-05 NOTE — Plan of Care (Signed)

## 2021-08-05 NOTE — Progress Notes (Signed)
Patient was noted with low sats in low 70's and sustaining. Patient was placed on 2L o2 [place] to maintain sats above 90 and was effective. Pt remains on oxygen at this time.

## 2021-08-06 ENCOUNTER — Ambulatory Visit: Payer: Medicare Other

## 2021-08-06 DIAGNOSIS — R300 Dysuria: Secondary | ICD-10-CM | POA: Diagnosis not present

## 2021-08-06 DIAGNOSIS — M1990 Unspecified osteoarthritis, unspecified site: Secondary | ICD-10-CM | POA: Diagnosis not present

## 2021-08-06 DIAGNOSIS — K219 Gastro-esophageal reflux disease without esophagitis: Secondary | ICD-10-CM | POA: Diagnosis not present

## 2021-08-06 DIAGNOSIS — E1122 Type 2 diabetes mellitus with diabetic chronic kidney disease: Secondary | ICD-10-CM | POA: Diagnosis not present

## 2021-08-06 DIAGNOSIS — N390 Urinary tract infection, site not specified: Secondary | ICD-10-CM | POA: Diagnosis not present

## 2021-08-06 DIAGNOSIS — Z743 Need for continuous supervision: Secondary | ICD-10-CM | POA: Diagnosis not present

## 2021-08-06 DIAGNOSIS — R2681 Unsteadiness on feet: Secondary | ICD-10-CM | POA: Diagnosis not present

## 2021-08-06 DIAGNOSIS — J449 Chronic obstructive pulmonary disease, unspecified: Secondary | ICD-10-CM | POA: Diagnosis not present

## 2021-08-06 DIAGNOSIS — Z741 Need for assistance with personal care: Secondary | ICD-10-CM | POA: Diagnosis not present

## 2021-08-06 DIAGNOSIS — Z7401 Bed confinement status: Secondary | ICD-10-CM | POA: Diagnosis not present

## 2021-08-06 DIAGNOSIS — R1311 Dysphagia, oral phase: Secondary | ICD-10-CM | POA: Diagnosis not present

## 2021-08-06 DIAGNOSIS — S72409S Unspecified fracture of lower end of unspecified femur, sequela: Secondary | ICD-10-CM | POA: Diagnosis not present

## 2021-08-06 DIAGNOSIS — R531 Weakness: Secondary | ICD-10-CM | POA: Diagnosis not present

## 2021-08-06 DIAGNOSIS — M6281 Muscle weakness (generalized): Secondary | ICD-10-CM | POA: Diagnosis not present

## 2021-08-06 DIAGNOSIS — J9611 Chronic respiratory failure with hypoxia: Secondary | ICD-10-CM | POA: Diagnosis not present

## 2021-08-06 DIAGNOSIS — M6259 Muscle wasting and atrophy, not elsewhere classified, multiple sites: Secondary | ICD-10-CM | POA: Diagnosis not present

## 2021-08-06 DIAGNOSIS — R197 Diarrhea, unspecified: Secondary | ICD-10-CM | POA: Diagnosis not present

## 2021-08-06 DIAGNOSIS — Z4789 Encounter for other orthopedic aftercare: Secondary | ICD-10-CM | POA: Diagnosis not present

## 2021-08-06 DIAGNOSIS — I2510 Atherosclerotic heart disease of native coronary artery without angina pectoris: Secondary | ICD-10-CM | POA: Diagnosis not present

## 2021-08-06 DIAGNOSIS — G4733 Obstructive sleep apnea (adult) (pediatric): Secondary | ICD-10-CM | POA: Diagnosis not present

## 2021-08-06 MED ORDER — METHOCARBAMOL 500 MG PO TABS
500.0000 mg | ORAL_TABLET | Freq: Four times a day (QID) | ORAL | 0 refills | Status: DC | PRN
Start: 2021-08-06 — End: 2022-06-04

## 2021-08-06 MED ORDER — OXYCODONE HCL 10 MG PO TABS: 10.0000 mg | ORAL_TABLET | ORAL | 0 refills | Status: DC

## 2021-08-06 MED ORDER — OXYCODONE HCL ER 10 MG PO T12A: 10.0000 mg | EXTENDED_RELEASE_TABLET | Freq: Two times a day (BID) | ORAL | 0 refills | Status: DC

## 2021-08-06 MED ORDER — ACETAMINOPHEN 325 MG PO TABS
325.0000 mg | ORAL_TABLET | Freq: Four times a day (QID) | ORAL | 1 refills | Status: AC | PRN
Start: 2021-08-06 — End: ?

## 2021-08-06 NOTE — Progress Notes (Signed)
Physical Therapy Treatment Patient Details Name: [name] MRN: [id number] DOB: Dec 12, 1960 Today's Date: 08/06/2021   History of Present Illness [name] is a 61 year old patient with left leg pain after a fall earlyJuly resulting in a L periprosthetic (prev TKA) distal femur fx; s/p surgical repair, KI at all times, 50% PWB, no ROM knee;  has a past medical history of Depression, Diabetes mellitus without complication ([place]), DYSPNEA (04/28/2009), Fibromyalgia, Headache, History of shingles, Hypertension, Insomnia, Long-term memory loss, OSA (obstructive sleep apnea), Osteoarthritis, Panic attacks, Rheumatoid arthritis ([place]), Sarcoidosis, Stroke ([place]), and Vitamin D deficiency.    PT Comments    Continuing work on functional mobility and activity tolerance;  Session focused on functional transfers, and gently incorporating ADLs into transfers, working in the bathroom and standing at sink at pt's request; Pt needed light mod assist (second person for safety) to stand from elevated seat surface; able to step pivot trnasfer with light mod assist of 2 as well, using RW well to keep limited weight bearing through LLE while standing; Stood at sink x2 for hygiene and washing up with assist from NT; Able to void on [facility] as well;   [name] values her independence, and is motivated to make improvements and progress back to her baseline; She will be a good candidate for post-acute rehab at SNF level;   Noted area of pressure sacrum/bil buttocks; Pt was unable to stand long enough to place dressings; Notified her bedside nurse; Placed an air cushion in her recliner to help with pressure redistribution when sitting up in recliner  Recommendations for follow up therapy are one component of a multi-disciplinary discharge planning process, led by the attending physician.  Recommendations may be updated based on patient status, additional functional criteria and insurance  authorization.  Follow Up Recommendations  Skilled nursing-short term rehab (<3 hours/day) (for post-acute rehab) Can patient physically be transported by private vehicle: No   Assistance Recommended at Discharge Frequent or constant Supervision/Assistance  Patient can return home with the following A lot of help with walking and/or transfers;Two people to help with bathing/dressing/bathroom;Assistance with cooking/housework;Assist for transportation   Equipment Recommendations  Other (comment) (Well-equipped)    Recommendations for Other Services       Precautions / Restrictions Precautions Precautions: Fall Precaution Comments: occasional urinary incontinence Required Braces or Orthoses: Knee Immobilizer - Left Knee Immobilizer - Left: On except when in CPM Restrictions LLE Weight Bearing: Partial weight bearing LLE Partial Weight Bearing Percentage or Pounds: 50     Mobility  Bed Mobility Overal bed mobility: Needs Assistance Bed Mobility: Supine to Sit     Supine to sit: Min assist     General bed mobility comments: Min assist for LLE management    Transfers Overall transfer level: Needs assistance Equipment used: Rolling walker (2 wheels) Transfers: Sit to/from Stand, Bed to chair/wheelchair/BSC Sit to Stand: Mod assist, +2 safety/equipment, From elevated surface   Step pivot transfers: +2 safety/equipment, Min assist       General transfer comment: Pt able to maintain WB status when performing sit to stand and stand pivot transfer to chair. Pt had to keep R LE outside of RW during sit to stand due to her body habitus and not having bariatric RW available. Pt utilized chair armrest on R for sit to stand transition. Pt with no LOB. Min verbal cues for technique and safety awareness; stood from bed, and then from recliner to sink x2, and from [facility] to sink; exhausted towards the end of  session, and instead of step pivot transfers recliner to/from [facility], moved  recliner/BSC behind her as she stood at teh sink for those transfers    Ambulation/Gait                   Stairs             Wheelchair Mobility    Modified Rankin (Stroke Patients Only)       Balance     Sitting balance-Leahy Scale: Fair (approaching Good)     Standing balance support: Bilateral upper extremity supported Standing balance-Leahy Scale: Poor                              Cognition Arousal/Alertness: Awake/alert Behavior During Therapy: WFL for tasks assessed/performed Overall Cognitive Status: Within Functional Limits for tasks assessed (for simple mobility tasks)                                          Exercises      General Comments General comments (skin integrity, edema, etc.): Session conducted on 5-6 L supplemental O2, and O2 sats stayed at or above 92%; Noted areas of pressure on pt's buttocks bilaterally and centrally towards sacrum; Ultimately, pt could not stand long enough to place dressings      Pertinent Vitals/Pain Pain Assessment Pain Assessment: Faces Faces Pain Scale: Hurts little more Pain Location: L knee; incr pain during transitions when L foot is unsupported Pain Descriptors / Indicators: Grimacing, Guarding Pain Intervention(s): Monitored during session, Other (comment) (supporting L foot and ankle more during transitions)    Home Living                          Prior Function            PT Goals (current goals can now be found in the care plan section) Acute Rehab PT Goals Patient Stated Goal: Wants to get washed up PT Goal Formulation: With patient Time For Goal Achievement: 08/15/21 Potential to Achieve Goals: Good Progress towards PT goals: Progressing toward goals    Frequency    Min 3X/week      PT Plan Current plan remains appropriate    Co-evaluation              AM-PAC PT "6 Clicks" Mobility   Outcome Measure  Help needed turning from  your back to your side while in a flat bed without using bedrails?: A Little Help needed moving from lying on your back to sitting on the side of a flat bed without using bedrails?: A Little Help needed moving to and from a bed to a chair (including a wheelchair)?: Total Help needed standing up from a chair using your arms (e.g., wheelchair or bedside chair)?: Total Help needed to walk in hospital room?: Total Help needed climbing 3-5 steps with a railing? : Total 6 Click Score: 10    End of Session Equipment Utilized During Treatment: Gait belt;Left knee immobilizer Activity Tolerance: Patient tolerated treatment well;Other (comment) (though quite tired at end of session) Patient left: in chair;with call bell/phone within reach;with chair alarm set Nurse Communication: Mobility status;Other (comment) (requested skin check) PT Visit Diagnosis: Unsteadiness on feet (R26.81);Muscle weakness (generalized) (M62.81);History of falling (Z91.81)     Time: 6063-0160 PT Time Calculation (min) (ACUTE  ONLY): 33 min  Charges:  $Therapeutic Activity: 23-37 mins                     Roney Marion, PT  Acute Rehabilitation Services Office [phone]    Colletta [place] 08/06/2021, 11:40 AM

## 2021-08-06 NOTE — Progress Notes (Signed)
Physical Therapy Treatment Patient Details Name: Archer Vise MRN: [id number] DOB: 03/31/1960 Today's Date: 08/06/2021   History of Present Illness Elwanda is a 61 year old patient with left leg pain after a fall earlyJuly resulting in a L periprosthetic (prev TKA) distal femur fx; s/p surgical repair, KI at all times, 50% PWB, no ROM knee; OK for knee flexion in CPM as of 7/21;  has a past medical history of Depression, Diabetes mellitus without complication ([place]), DYSPNEA (04/28/2009), Fibromyalgia, Headache, History of shingles, Hypertension, Insomnia, Long-term memory loss, OSA (obstructive sleep apnea), Osteoarthritis, Panic attacks, Rheumatoid arthritis ([place]), Sarcoidosis, Stroke ([place]), and Vitamin D deficiency.    PT Comments    This PT and pt's bedside Nurse Tech opted to help her back to bed because she was so fatigued and less interactive than usual after all the activity standing and transferring at the sink while washing up; 2 person Mod assist to help her transfer back to bed (towards her stronger, R side) and Max assist of 2 to position back in bed; Notified pt's bedside nurse of pt being back in bed, and asked to come reposition pt to off-load her sacrum (pt declined getting sidelying at the end of this session); checked BPs twice during session due to pt being less responsive, and BPs and O2 sats were normal (pt with supplemental O2 5-6 L throughout session)   Recommendations for follow up therapy are one component of a multi-disciplinary discharge planning process, led by the attending physician.  Recommendations may be updated based on patient status, additional functional criteria and insurance authorization.  Follow Up Recommendations  Skilled nursing-short term rehab (<3 hours/day) (for post-acute rehab) Can patient physically be transported by private vehicle: No   Assistance Recommended at Discharge Frequent or constant Supervision/Assistance  Patient can  return home with the following A lot of help with walking and/or transfers;Two people to help with bathing/dressing/bathroom;Assistance with cooking/housework;Assist for transportation   Equipment Recommendations  Other (comment) (Well-equipped)    Recommendations for Other Services       Precautions / Restrictions Precautions Precautions: Fall Precaution Comments: occasional urinary incontinence Required Braces or Orthoses: Knee Immobilizer - Left Knee Immobilizer - Left: On except when in CPM Restrictions LLE Weight Bearing: Partial weight bearing LLE Partial Weight Bearing Percentage or Pounds: 50     Mobility  Bed Mobility Overal bed mobility: Needs Assistance Bed Mobility: Sit to Supine     Supine to sit: Min assist Sit to supine: Mod assist, +2 for physical assistance   General bed mobility comments: Was sitting quite close to EOB after the transfer back to bed, and needed mod assist of 2 to supoprt LLE and RLE back to bed, as well as get further back onto bed, away from edge    Transfers Overall transfer level: Needs assistance Equipment used: 2 person hand held assist, Rolling walker (2 wheels) Transfers: Bed to chair/wheelchair/BSC Sit to Stand: Mod assist, +2 safety/equipment, From elevated surface   Step pivot transfers: +2 safety/equipment, Min assist Squat pivot transfers: +2 physical assistance, Mod assist     General transfer comment: Pt was fatigued and less responsive when it was time to get back into bed, so decided to perform a simple squat pivot transfer towards teh R side to get back to bed; mid-transfer, pt stopped, and reached for one side of the RW, which stopped the momentum getting back to bed; continued to move pt towards bed with her L hand on one side of RW, and when  she sat down she was quite close to the EOB; placed RW directly in front of her and she was able to boost her bottom further back into the bed in prep for lying down     Ambulation/Gait                   Stairs             Wheelchair Mobility    Modified Rankin (Stroke Patients Only)       Balance     Sitting balance-Leahy Scale: Fair (approaching Good)     Standing balance support: Bilateral upper extremity supported Standing balance-Leahy Scale: Poor                              Cognition Arousal/Alertness: Awake/alert (but eyes drifting closed, and pt mush less talkative and interactive) Behavior During Therapy: Flat affect Overall Cognitive Status: Within Functional Limits for tasks assessed (for simple mobility tasks)                                 General Comments: After the increased activity in bathroom at sink last session, pt quite fatigued, and needed incr time to answer questions -- not her usual; she did indicate that sometiems she "checks out" sometimes; but this difference was enough for PT and NT to opt to help her back to bed        Exercises      General Comments General comments (skin integrity, edema, etc.): Given pt's decr responsiveness, obtained BPs which were normal; Positioned pt comforatbly in bed, and NT replaced PureWick; Briefly discussed with pt recommendation that she be positioned in at least semi-sidelying position to decrease pressure on her sacrum/buttocks, and pt declined; Messaged pt's bedside nurse to check on her soon, and consider positioning her off of her back      Pertinent Vitals/Pain Pain Assessment Pain Assessment: Faces Faces Pain Scale: Hurts even more Pain Location: L knee; incr pain during transitions when L foot is unsupported Pain Descriptors / Indicators: Grimacing, Guarding Pain Intervention(s): Monitored during session, Repositioned    Home Living                          Prior Function            PT Goals (current goals can now be found in the care plan section) Acute Rehab PT Goals Patient Stated Goal: to get back to  bed PT Goal Formulation: With patient Time For Goal Achievement: 08/15/21 Potential to Achieve Goals: Good Progress towards PT goals: Progressing toward goals    Frequency    Min 3X/week      PT Plan Current plan remains appropriate    Co-evaluation              AM-PAC PT "6 Clicks" Mobility   Outcome Measure  Help needed turning from your back to your side while in a flat bed without using bedrails?: A Little Help needed moving from lying on your back to sitting on the side of a flat bed without using bedrails?: A Little Help needed moving to and from a bed to a chair (including a wheelchair)?: Total Help needed standing up from a chair using your arms (e.g., wheelchair or bedside chair)?: Total Help needed to walk in hospital room?: Total Help needed  climbing 3-5 steps with a railing? : Total 6 Click Score: 10    End of Session Equipment Utilized During Treatment: Gait belt;Left knee immobilizer Activity Tolerance: Patient limited by fatigue Patient left: in bed;with call bell/phone within reach;with bed alarm set Nurse Communication: Mobility status;Other (comment) (Consider repositioning pt to off-load her sacrum) PT Visit Diagnosis: Unsteadiness on feet (R26.81);Muscle weakness (generalized) (M62.81);History of falling (Z91.81)     Time: 7076-1518 PT Time Calculation (min) (ACUTE ONLY): 23 min  Charges:  $Therapeutic Activity: 23-37 mins                     Roney Marion, PT  Acute Rehabilitation Services Office [phone]    Colletta [place] 08/06/2021, 3:33 PM

## 2021-08-06 NOTE — Plan of Care (Signed)
  Problem: Coping: Goal: Level of anxiety will decrease Outcome: Progressing   Problem: Elimination: Goal: Will not experience complications related to bowel motility Outcome: Not Progressing Goal: Will not experience complications related to urinary retention Outcome: Progressing   Problem: Pain Managment: Goal: General experience of comfort will improve Outcome: Progressing

## 2021-08-06 NOTE — TOC Transition Note (Signed)
Transition of Care [facility]) - CM/SW Discharge Note   Patient Details  Name: Cruz Devilla MRN: [id number] Date of Birth: [birth year]  [facility]) CM/SW Contact:  Joanne Chars, LCSW Phone Number: 08/06/2021, 2:03 PM   Clinical Narrative:   Pt discharging to [facility].  RN call report to [phone].      Final next level of care: Skilled Nursing Facility Barriers to Discharge: Barriers Resolved   Patient Goals and CMS Choice Patient states their goals for this hospitalization and ongoing recovery are:: "walk without pain." CMS Medicare.gov Compare Post Acute Care list provided to:: Patient Choice offered to / list presented to : Patient  Discharge Placement              Patient chooses bed at: [facility] Patient to be transferred to facility by: [place] Name of family member notified: son [name] Patient and family notified of of transfer: 08/06/21  Discharge Plan and Services In-house Referral: Clinical Social Work   Post Acute Care Choice: [place]                               Social Determinants of Health (SDOH) Interventions     Readmission Risk Interventions     No data to display

## 2021-08-06 NOTE — TOC Progression Note (Addendum)
Transition of Care [facility]) - Progression Note    Patient Details  Name: [name] MRN: [id number] Date of Birth: [birth year]  Transition of Care [facility]) CM/SW Contact  Joanne Chars, LCSW Phone Number: 08/06/2021, 11:22 AM  Clinical Narrative:  [facility] calls and makes bed offer.  CSW spoke with pt and she does want to accept this offer.     1200: Auth approved instantly in [place]: [id number]: 3 days: 7/24-7/26.  MD/PA notified.  Expected Discharge Plan: [place] Barriers to Discharge: Continued Medical Work up, SNF Pending bed offer  Expected Discharge Plan and Services Expected Discharge Plan: [place] In-house Referral: Clinical Social Work   Post Acute Care Choice: [place] Living arrangements for the past 2 months: Single Family Home                                       Social Determinants of Health (SDOH) Interventions    Readmission Risk Interventions     No data to display

## 2021-08-06 NOTE — Discharge Summary (Signed)
Physician Discharge Summary      Patient ID: Bayan Hedstrom MRN: [id number] DOB/AGE: 1960/01/16 61 y.o.  Admit date: 07/31/2021 Discharge date: 08/06/2021  Admission Diagnoses:  Principal Problem:   Closed fracture of distal end of femur, unspecified fracture morphology, unspecified laterality, initial encounter [facility])   Discharge Diagnoses:  Same  Surgeries: Procedure(s): left medial femoral condyle fracture fixation on 07/31/2021   Consultants:   Discharged Condition: Stable  Hospital Course: Jazmeen Axtell is an 61 y.o. female who was admitted 07/31/2021 with a chief complaint of left knee pain, and found to have a diagnosis of Closed fracture of distal end of femur, unspecified fracture morphology, unspecified laterality, initial encounter ([place]).  They were brought to the operating room on 07/31/2021 and underwent the above named procedures.  Patient tolerated procedure well.  She was mobilized with physical therapy touchdown weightbearing for transfers.  Started CPM use which helps her to mobilize.  Discharge to skilled nursing for continued rehabilitation in good condition.  Ultrasound in the hospital for DVT negative bilateral lower extremities.  Plan is to continue with pain medicine as well as DVT prophylaxis and progressive mobilization after she returns for her first postop visit  Antibiotics given:  Anti-infectives (From admission, onward)    Start     Dose/Rate Route Frequency Ordered Stop   08/01/21 0600  ceFAZolin (ANCEF) IVPB 2g/100 mL premix        2 g 200 mL/hr over 30 Minutes Intravenous On call to O.R. 07/31/21 1325 07/31/21 1649   07/31/21 2200  ceFAZolin (ANCEF) IVPB 2g/100 mL premix        2 g 200 mL/hr over 30 Minutes Intravenous Every 8 hours 07/31/21 2045 08/01/21 1507   07/31/21 1739  vancomycin (VANCOCIN) powder  Status:  Discontinued          As needed 07/31/21 1740 07/31/21 1851     .  Recent vital signs:  Vitals:   08/06/21  0546 08/06/21 0829  BP: 101/63 107/81  Pulse:  68  Resp: 16 17  Temp: 98.1 F (36.7 C) 97.8 F (36.6 C)  SpO2:  100%    Recent laboratory studies:  Results for orders placed or performed during the hospital encounter of 07/31/21  Glucose, capillary  Result Value Ref Range   Glucose-Capillary 113 (H) 70 - 99 mg/dL  Basic metabolic panel  Result Value Ref Range   Sodium 144 135 - 145 mmol/L   Potassium 3.9 3.5 - 5.1 mmol/L   Chloride 107 98 - 111 mmol/L   CO2 22 22 - 32 mmol/L   Glucose, Bld 115 (H) 70 - 99 mg/dL   BUN 22 8 - 23 mg/dL   Creatinine, Ser 0.88 0.44 - 1.00 mg/dL   Calcium 9.4 8.9 - 10.3 mg/dL   GFR, Estimated >60 >60 mL/min   Anion gap 15 5 - 15  CBC  Result Value Ref Range   WBC 7.2 4.0 - 10.5 K/uL   RBC 3.88 3.87 - 5.11 MIL/uL   Hemoglobin 12.3 12.0 - 15.0 g/dL   HCT 38.6 36.0 - 46.0 %   MCV 99.5 80.0 - 100.0 fL   MCH 31.7 26.0 - 34.0 pg   MCHC 31.9 30.0 - 36.0 g/dL   RDW 15.9 (H) 11.5 - 15.5 %   Platelets 198 150 - 400 K/uL   nRBC 0.0 0.0 - 0.2 %  Glucose, capillary  Result Value Ref Range   Glucose-Capillary 118 (H) 70 - 99 mg/dL  Glucose, capillary  Result Value Ref Range   Glucose-Capillary 118 (H) 70 - 99 mg/dL   Comment 1 Notify RN    Comment 2 Document in Chart    *Note: Due to a large number of results and/or encounters for the requested time period, some results have not been displayed. A complete set of results can be found in Results Review.    Discharge Medications:   Allergies as of 08/06/2021       Reactions   Belsomra [suvorexant] Other (See Comments)   [place] out of bed while asleep: "I'm waking up as I'm falling on the floor;" "Night terrors"   Enalapril Maleate Cough   Latex Hives   Nickel Other (See Comments)   Blisters  Pt has a titanium right and left knee - nickel causes skin irritations that form into blisters and sores   Other Other (See Comments)   Patient has Sarcoidosis and can't tolerate any metals   Morphine And  Related Itching, Nausea Only   Doxycycline Diarrhea, Nausea And Vomiting   Hydrochlorothiazide Other (See Comments)   Low potassium levels    Hydroxychloroquine Sulfate Other (See Comments)   Vision changes    Lyrica [pregabalin] Other (See Comments)   "Made me feel high"   Tape Other (See Comments)   Medical tape causes bruising        Medication List     STOP taking these medications    Fish Oil 1200 MG Caps   OVER THE COUNTER MEDICATION   oxyCODONE-acetaminophen 7.5-325 MG tablet Commonly known as: PERCOCET   vitamin E 180 MG (400 UNITS) capsule       TAKE these medications    Accu-Chek Guide test strip Generic drug: glucose blood Use to check blood sugar once a day.   acetaminophen 325 MG tablet Commonly known as: TYLENOL Take 1-2 tablets (325-650 mg total) by mouth every 6 (six) hours as needed for mild pain (pain score 1-3 or temp > 100.5). What changed:  medication strength how much to take when to take this reasons to take this   albuterol 108 (90 Base) MCG/ACT inhaler Commonly known as: VENTOLIN HFA INHALE TWO puffs into THE lungs EVERY SIX HOURS AS NEEDED wheezind AND For SHORTNESS OF BREATH   B-D ULTRA-FINE 33 LANCETS Misc Use as instructed to test sugars once  daily   budesonide-formoterol 160-4.5 MCG/ACT inhaler Commonly known as: Symbicort Inhale 2 puffs into the lungs TWICE DAILY What changed:  how much to take when to take this additional instructions   carvedilol 12.5 MG tablet Commonly known as: COREG Take 1 tablet (12.5 mg total) by mouth 2 (two) times daily with a meal.   dexlansoprazole 60 MG capsule Commonly known as: DEXILANT TAKE ONE CAPSULE BY MOUTH EVERY DAY Annual appt due in July must see provider for future refills   gabapentin 300 MG capsule Commonly known as: NEURONTIN TAKE ONE CAPSULE BY MOUTH EVERY MORNING **May take a senod capsule during THE DAY AS NEEDED FOR PAIN   Humira Pen 40 MG/0.4ML Pnkt Generic drug:  Adalimumab Inject 40 mg as directed every Wednesday.   hydrOXYzine 25 MG tablet Commonly known as: ATARAX Take 1 tablet (25 mg total) by mouth every 8 (eight) hours as needed. What changed:  how much to take when to take this additional instructions   metFORMIN 500 MG 24 hr tablet Commonly known as: GLUCOPHAGE-XR Take 2 tablets (1,000 mg total) by mouth at bedtime. Annual appt due in July must see provider for future  refills   methocarbamol 500 MG tablet Commonly known as: ROBAXIN Take 1 tablet (500 mg total) by mouth every 6 (six) hours as needed for muscle spasms.   Multivitamin Women 50+ Tabs Take 1 tablet by mouth daily.   naloxone 4 MG/0.1ML Liqd nasal spray kit Commonly known as: NARCAN Place 1 spray into the nose once as needed (overdose).   naltrexone 50 MG tablet Commonly known as: DEPADE Take 12.5 mg by mouth at bedtime.   neomycin-polymyxin b-dexamethasone 3.5-10000-0.1 Susp Commonly known as: MAXITROL Place 1 drop into both eyes 4 (four) times daily as needed (eye irritation).   neomycin-polymyxin-hydrocortisone OTIC solution Commonly known as: CORTISPORIN Apply 1 drop to the left hallux at the area of soreness and pain at the toenail once daily until symptoms have resolved   oxyCODONE 10 mg 12 hr tablet Commonly known as: OXYCONTIN Take 1 tablet (10 mg total) by mouth every 12 (twelve) hours.   Oxycodone HCl 10 MG Tabs Take 1 tablet (10 mg total) by mouth every 4 (four) hours.   OXYGEN Inhale 2 L into the lungs continuous.   polyvinyl alcohol 1.4 % ophthalmic solution Commonly known as: LIQUIFILM TEARS Place 1 drop into both eyes as needed for dry eyes.   potassium chloride 10 MEQ tablet Commonly known as: KLOR-CON Take 1 tablet (10 mEq total) by mouth daily.   QUEtiapine 25 MG tablet Commonly known as: SEROquel Take 1 in am and 2 at hs   topiramate 50 MG tablet Commonly known as: TOPAMAX Take 1 tablet (50 mg total) by mouth 2 (two) times  daily.   venlafaxine XR 75 MG 24 hr capsule Commonly known as: EFFEXOR-XR Take 1 capsule (75 mg total) by mouth daily with breakfast.   Vitamin B-12 1000 MCG Subl Place 1 tablet (1,000 mcg total) under the tongue daily.   Vitamin D-3 125 MCG (5000 UT) Tabs Take 5,000 Units by mouth daily.   Xarelto 20 MG Tabs tablet Generic drug: rivaroxaban Take 1 tablet (20 mg total) by mouth at bedtime.        Diagnostic Studies: VAS [place] LOWER EXTREMITY VENOUS (DVT)  Result Date: 08/02/2021  Lower Venous DVT Study Patient Name:  ANISAH KUCK  Date of Exam:   08/02/2021 Medical Rec #: [id number]                  Accession #:    [id number] Date of Birth: [birth year]                  Patient Gender: F Patient Age:   86 years Exam Location:  [facility] Procedure:      VAS [place] LOWER EXTREMITY VENOUS (DVT) Referring Phys: Gloriann Loan --------------------------------------------------------------------------------  Indications: Swelling. Other Indications: Femur fracture S/P surgical repair. Risk Factors: HX of DVT/PE and obesity. Anticoagulation: Patient on Xarelto prior to admission. Limitations: Body habitus, poor ultrasound/tissue interface and patient pain intolerance. Comparison Study: Previous exam on 07/21/16 was positive for DVT in RLE                   (extensive) and negative in LLE Performing Technologist: Rogelia Rohrer RVT, RDMS  Examination Guidelines: A complete evaluation includes B-mode imaging, spectral Doppler, color Doppler, and power Doppler as needed of all accessible portions of each vessel. Bilateral testing is considered an integral part of a complete examination. Limited examinations for reoccurring indications may be performed as noted. The reflux portion of the exam is performed with the patient in  reverse Trendelenburg.  +-----+---------------+---------+-----------+----------+--------------+ RIGHTCompressibilityPhasicitySpontaneityPropertiesThrombus Aging  +-----+---------------+---------+-----------+----------+--------------+ CFV  Full           Yes      Yes                                 +-----+---------------+---------+-----------+----------+--------------+   +---------+---------------+---------+-----------+----------+-------------------+ LEFT     CompressibilityPhasicitySpontaneityPropertiesThrombus Aging      +---------+---------------+---------+-----------+----------+-------------------+ CFV      Full           Yes      Yes                                      +---------+---------------+---------+-----------+----------+-------------------+ SFJ      Full                                                             +---------+---------------+---------+-----------+----------+-------------------+ FV Prox  Full           Yes      Yes                                      +---------+---------------+---------+-----------+----------+-------------------+ FV Mid                  Yes      Yes                                      +---------+---------------+---------+-----------+----------+-------------------+ FV Distal               Yes      Yes                                      +---------+---------------+---------+-----------+----------+-------------------+ PFV      Full                                                             +---------+---------------+---------+-----------+----------+-------------------+ POP      Full           Yes      Yes                                      +---------+---------------+---------+-----------+----------+-------------------+ PTV      Full                                         Not well visualized +---------+---------------+---------+-----------+----------+-------------------+ PERO     Full  Not well visualized +---------+---------------+---------+-----------+----------+-------------------+   Left Technical Findings: Mid  and distal femoral veins are patent by color and doppler only due to patient being unable to tolerate compression due to pain.   Summary: RIGHT: - No evidence of common femoral vein obstruction. - Portions of this examination were limited- see technologist comments above.  LEFT: - No cystic structure found in the popliteal fossa.  *See table(s) above for measurements and observations. Electronically signed by Jamelle Haring on 08/02/2021 at 8:22:51 PM.    Final    DG Knee Left Port  Result Date: 07/31/2021 CLINICAL DATA:  Postop. EXAM: PORTABLE LEFT KNEE - 1-2 VIEW COMPARISON:  Preoperative radiographs. FINDINGS: Medial plate and multi screw fixation of periprosthetic distal femur fracture. 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 Chronic ossified densities in the region of the quadriceps and patellar tendons. There is a small knee joint effusion. IMPRESSION: Medial plate and screw fixation of periprosthetic distal femur fracture. No immediate postoperative complication. Electronically Signed   By: Keith Rake M.D.   On: 07/31/2021 19:52   DG Knee 1-2 Views Left  Result Date: 07/31/2021 CLINICAL DATA:  Medial left femoral condylar fracture, ORIF EXAM: LEFT KNEE - 1-2 VIEW COMPARISON:  07/25/2021 FINDINGS: 4 fluoroscopic images are obtained during the performance of the procedure and are provided for interpretation only. Lateral plate and screw fixation traverses the medial femoral condylar fracture, with near anatomic alignment. Please refer to operative report. Fluoroscopy time: 32 seconds, 4.08 mGy IMPRESSION: 1. ORIF medial femoral condylar fracture with near anatomic alignment. Electronically Signed   By: Randa Ngo M.D.   On: 07/31/2021 18:29   DG C-Arm 1-60 Min-No Report  Result Date: 07/31/2021 Fluoroscopy was utilized by the requesting physician.  No radiographic interpretation.   XR KNEE 3 VIEW LEFT  Result Date: 07/29/2021 AP, lateral, sunrise views of left knee reviewed.  Left  total knee prosthesis in good position with periprosthetic fracture of the medial femoral condyle without significant displacement.  No other fractures noted.  No dislocation.  No abnormal patellar height.  CT KNEE LEFT WO CONTRAST  Result Date: 07/27/2021 CLINICAL DATA:  Left knee medial femoral condyle fracture after recent fall. History of prior knee replacement. EXAM: CT OF THE LEFT KNEE WITHOUT CONTRAST TECHNIQUE: Multidetector CT imaging of the left knee was performed according to the standard protocol. Multiplanar CT image reconstructions were also generated. RADIATION DOSE REDUCTION: This exam was performed according to the departmental dose-optimization program which includes automated exposure control, adjustment of the mA and/or kV according to patient size and/or use of iterative reconstruction technique. COMPARISON:  Left knee x-rays dated July 25, 2021. FINDINGS: Bones/Joint/Cartilage Acute nondisplaced longitudinal fracture of the medial distal femoral metaphysis near the epicondyle (series 3, image 37). No additional fracture. No dislocation. Prior total knee arthroplasty. No evidence of hardware failure or loosening. Moderate lipohemarthrosis. Ligaments Ligaments are suboptimally evaluated by CT. Muscles and Tendons Grossly intact. Similar chronic dystrophic calcifications involving the quadriceps and patellar tendons. Soft tissue Anterior knee soft tissue swelling. No fluid collection or hematoma. No soft tissue mass. IMPRESSION: 1. Acute nondisplaced longitudinal fracture of the medial distal femoral metaphysis near the epicondyle. 2. Moderate lipohemarthrosis. Electronically Signed   By: Titus Dubin M.D.   On: 07/27/2021 13:44    Disposition:        Signed: Landry Dyke [place] 08/06/2021, 12:55 PM

## 2021-08-06 NOTE — Plan of Care (Signed)

## 2021-08-07 ENCOUNTER — Telehealth: Payer: Self-pay | Admitting: Orthopedic Surgery

## 2021-08-07 ENCOUNTER — Telehealth: Payer: Self-pay | Admitting: *Deleted

## 2021-08-07 ENCOUNTER — Ambulatory Visit: Payer: Medicare Other | Admitting: Podiatry

## 2021-08-07 ENCOUNTER — Other Ambulatory Visit: Payer: Self-pay | Admitting: *Deleted

## 2021-08-07 DIAGNOSIS — K219 Gastro-esophageal reflux disease without esophagitis: Secondary | ICD-10-CM | POA: Diagnosis not present

## 2021-08-07 DIAGNOSIS — M1990 Unspecified osteoarthritis, unspecified site: Secondary | ICD-10-CM | POA: Diagnosis not present

## 2021-08-07 DIAGNOSIS — S72409S Unspecified fracture of lower end of unspecified femur, sequela: Secondary | ICD-10-CM | POA: Diagnosis not present

## 2021-08-07 DIAGNOSIS — J9611 Chronic respiratory failure with hypoxia: Secondary | ICD-10-CM | POA: Diagnosis not present

## 2021-08-07 NOTE — Telephone Encounter (Signed)
RNCM call to patient and her son to discuss issues that they are reporting overnight from SNF [facility]). 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 She is requesting a change of SNF for rehab. Discussed with son as well. Made aware that CM will contact [facility] and discuss issues and have them start process of change of location. Was not able to reach SW, but did leave a message explaining patient requested change of location. Patient called and left me a message later in the morning and states the SW had been in to update her and she informed she wanted to be discharged. I attempted to call patient back, but there was no answer at around 12:15pm. Will re-attempt later in the day.

## 2021-08-07 NOTE — Telephone Encounter (Signed)
Ok thx.

## 2021-08-07 NOTE — Telephone Encounter (Signed)
Patient's son [name] called advised his mother is in [place]. Patient said his mother called him crying stating she has been laying in feces all night. [name] said the call button was hind from her and she could not call for assistance. [name] said he would like to have his mother moved to another facility. [name] said he is going to visit his mother today. The number to contact [name] is [phone]

## 2021-08-07 NOTE — Patient Outreach (Addendum)
Per [place] eligible member currently resides in [place] [facility]F.  Screening for potential [facility] care coordination/care management services. Ms. Gerstenberger was recently active with [facility].   Ms. Meland admitted to SNF on 08/06/21 after hospitalization.  Facility site visit to [facility] skilled nursing facility. Met with SNF social workers Al Pimple and [place]. Discussed Ms. Castronovo was recently active with [facility].  Spoke with Ms. Lee  in room at [facility] Ms. Nadeau was upset. Stating she wants to transfer to another facility. Reports she had a poor experience last night. Upon leaving Ms. Benedicto's room, facility administration was going in the room to speak with her. Writer also made SNF social workers aware Ms. Bronkema is displeased with care and wanted to transfer to another facility.  Ms. Bignell reports she lives alone. Has caregiver assist 1.5 hours a day Monday- Friday. Unsure of agency name. States she was independent prior with cooking and driving. States she has transportation services thru social services. States her son assists with meals if needed.   Addendum 1506: Telephone call from Al Pimple, [facility] SNF SW stating she is trying to find a female bed at one of the the other skilled nursing facilities. [facility] not accepting patients currently, [facility] does not have a female bed today, [facility] does not have a female bed today, voicemail left for [facility]. Writer contacted [facility]- no female bed today. Isaias Cowman, [place] in Admissions has 2 semi private beds. Provided Hoyle Sauer with fax and telephone number to send to [facility].   1622: Voicemail message left for Hoyle Sauer and email secure email sent to inquire about status of transfer.   Will continue to follow.     Marthenia Rolling, MSN, RN,BSN [place] Acute Care Coordinator [phone] [facility]) [phone]  (Toll  free office)

## 2021-08-08 ENCOUNTER — Other Ambulatory Visit: Payer: Self-pay | Admitting: *Deleted

## 2021-08-08 ENCOUNTER — Encounter: Payer: Medicare Other | Admitting: Orthopedic Surgery

## 2021-08-08 ENCOUNTER — Other Ambulatory Visit: Payer: Self-pay | Admitting: Internal Medicine

## 2021-08-08 DIAGNOSIS — G4733 Obstructive sleep apnea (adult) (pediatric): Secondary | ICD-10-CM | POA: Diagnosis not present

## 2021-08-08 DIAGNOSIS — E1122 Type 2 diabetes mellitus with diabetic chronic kidney disease: Secondary | ICD-10-CM | POA: Diagnosis not present

## 2021-08-08 DIAGNOSIS — I2510 Atherosclerotic heart disease of native coronary artery without angina pectoris: Secondary | ICD-10-CM | POA: Diagnosis not present

## 2021-08-08 DIAGNOSIS — J9611 Chronic respiratory failure with hypoxia: Secondary | ICD-10-CM | POA: Diagnosis not present

## 2021-08-08 DIAGNOSIS — S72409S Unspecified fracture of lower end of unspecified femur, sequela: Secondary | ICD-10-CM | POA: Diagnosis not present

## 2021-08-08 DIAGNOSIS — J449 Chronic obstructive pulmonary disease, unspecified: Secondary | ICD-10-CM | POA: Diagnosis not present

## 2021-08-08 DIAGNOSIS — Z4789 Encounter for other orthopedic aftercare: Secondary | ICD-10-CM | POA: Diagnosis not present

## 2021-08-08 NOTE — Patient Outreach (Signed)
[facility] Post- Acute Care Coordinator follow up.   Confirmed with Al Pimple, SNF SW at [facility] that [facility] extended a bed offer to [name]. [name] accepted bed offer. She will transfer from [facility] to [facility] on Thursday, July 27th in the afternoon.  Writer will follow up with [name] at [facility] early next week.   Marthenia Rolling, MSN, RN,BSN [place] [place] Acute Care Coordinator [phone] [facility]) [phone]  (Toll free office)

## 2021-08-09 ENCOUNTER — Other Ambulatory Visit: Payer: Self-pay | Admitting: *Deleted

## 2021-08-09 NOTE — Unmapped (Signed)
Carolyn Newton is currently in a rehab facility, they have approved 2 weeks of Humira for now and she has 1 to 2 doses left at home. Facility will call us if they need any additional medication and we will followup with patient in a few weeks.    [facility] Clinical Assessment & Refill Coordination Note    Carolyn Newton, DOB: 13-Nov-1960  Phone: [phone] (home)     All above HIPAA information was verified with patient.     Was a Nurse, learning disability used for this call? No    Specialty Medication(s):   Inflammatory Disorders: Humira     Current Outpatient Medications   Medication Sig Dispense Refill    albuterol (PROVENTIL HFA;VENTOLIN HFA) 90 mcg/actuation inhaler Inhale 2 puffs.      albuterol HFA 90 mcg/actuation inhaler Inhale 2 puffs.      amoxicillin-clavulanate (AUGMENTIN) 875-125 mg per tablet Take 1 tablet by mouth every twelve (12) hours.      benzonatate (TESSALON) 200 MG capsule Take 200 mg by mouth.      blood sugar diagnostic (ON CALL EXPRESS TEST STRIP) Strp Use as instructed to check blood sugar once a day. 100 each 12    blood sugar diagnostic Strp TEST TWICE DAILY      blood-glucose meter (ON CALL EXPRESS METER) Misc Use to check blood sugar once a day 1 each 0    blood-glucose meter kit Use to check blood sugar 2 times per day      carvedilol (COREG) 12.5 MG tablet TAKE 1 TABLET BY MOUTH TWICE DAILY WITH A MEAL      chlorhexidine (PERIDEX) 0.12 % solution 5 mL.      clobetasoL (TEMOVATE) 0.05 % cream Apply topically Two (2) times a day. 60 g 5    clonazePAM (KLONOPIN) 0.5 MG tablet       dexlansoprazole (DEXILANT) 60 mg capsule       diclofenac sodium (VOLTAREN) 1 % gel Apply 2 g topically 2 (two) times daily as needed (for pain). 100/4=25      docusate sodium (COLACE) 100 MG capsule Take 100 mg by mouth.      empty container (SHARPS CONTAINER) Misc Use as directed to dispose of Humira needles 1 each 2    empty container Misc USE AS DIRECTED 1 each 2 escitalopram oxalate (LEXAPRO) 20 MG tablet TAKE 1 TABLET BY MOUTH DAILY      esomeprazole (NEXIUM) 40 MG capsule Take 40 mg by mouth.      fluticasone propionate (FLONASE) 50 mcg/actuation nasal spray 1 spray into each nostril.      furosemide (LASIX) 20 MG tablet Take 20 mg by mouth.      gabapentin (NEURONTIN) 300 MG capsule Take 1 capsule (300 mg total) by mouth 2 (two) times daily as needed (for pain).  1    halobetasol (ULTRAVATE) 0.05 % ointment Apply topically Two (2) times a day. Twice daily as needed for itchy spots on feet.  When severe, put under wet washcloth or saran wrap for 1-2 hours. 50 g 5    HUMIRA PEN CITRATE FREE 40 MG/0.4 ML Inject the contents of 1 pen (40 mg total) under the skin every seven (7) days. 4 each 11    hydrOXYzine (ATARAX) 25 MG tablet Take 1/4 tablet in the morning and 1/2 tablet in the evenings as needed for itch 30 tablet 5    hydrOXYzine (ATARAX) 25 MG tablet Take 25 mg by mouth.  lancets 28 gauge Misc 1 each.      lancets 30 gauge Misc Use as instructed to test sugars once daily 100 each 12    levoFLOXacin (LEVAQUIN) 750 MG tablet Take 750 mg by mouth.      lidocaine (LIDODERM) 5 % patch Place 1 patch onto the skin daily as needed. Apply patch to area most significant pain once per day. Remove and discard patch within 12 hours of application.      metFORMIN (GLUCOPHAGE) 500 MG tablet TAKE 2 TABLETS(1000 MG) BY MOUTH DAILY WITH FOOD      metFORMIN (GLUCOPHAGE-XR) 500 MG 24 hr tablet Take 2 tablets (1,000 mg total) by mouth at bedtime.      methocarbamoL (ROBAXIN) 500 MG tablet       moxifloxacin (AVELOX) 400 mg tablet Take 1 tablet (400 mg total) by mouth daily. 7 tablet 0    multivitamin with minerals tablet Take 1 tablet by mouth daily.      naloxone (NARCAN) 4 mg nasal spray Call 911 Instill ONE SPRAY into one nostril. REPEAT EVERY FIVE MINUTES if no OR minimal response      naltrexone (DEPADE) 50 mg tablet Split tablets into quarters. Take one-quarter of a tablet once daily 30 tablet 11    neomycin-polymyxin-dexamethasone (MAXITROL) 3.5mg /mL-10,000 unit/mL-0.1 % ophthalmic suspension SHAKE LIQUID AND INSTILL 1 DROP IN LEFT EYE THREE TIMES DAILY      nortriptyline (PAMELOR) 75 MG capsule Take 75 mg by mouth.      nystatin (MYCOSTATIN) 100,000 unit/mL suspension       omeprazole (PRILOSEC) 20 MG capsule Take 20 mg by mouth.      oxyCODONE-acetaminophen (PERCOCET) 10-325 mg per tablet 1 Tablet Four Times A Day AS NEEDED.      oxyCODONE-acetaminophen (PERCOCET) 5-325 mg per tablet 1 Tablet Four Times A Day as needed.      potassium chloride (KLOR-CON) 10 MEQ CR tablet TAKE 1 TABLET BY MOUTH DAILY      potassium chloride (KLOR-CON) 10 MEQ CR tablet Take 1 tablet by mouth daily.      predniSONE (DELTASONE) 20 MG tablet Take 20 mg by mouth.      rivaroxaban (XARELTO) 20 mg tablet TAKE 1 TABLET BY MOUTH EVERY DAY WITH SUPPER      suvorexant (BELSOMRA) 20 mg tablet Take 20 mg by mouth.      SYMBICORT 160-4.5 mcg/actuation inhaler Inhale 2 puffs into the lungs TWICE DAILY 120/4=30  5    topiramate (TOPAMAX) 50 MG tablet TAKE 1 TABLET BY MOUTH TWICE DAILY      topiramate (TOPAMAX) 50 MG tablet Take 50 mg by mouth.      triamcinolone (KENALOG) 0.1 % cream Apply topically Two (2) times a day. As needed for itching 454 g 5    venlafaxine (EFFEXOR-XR) 75 MG 24 hr capsule Take 75 mg by mouth.       Current Facility-Administered Medications   Medication Dose Route Frequency Provider Last Rate Last Admin    triamcinolone acetonide (KENALOG) injection 10 mg  10 mg Intradermal Once Elsie Stain, MD            Changes to medications: Carolyn Newton reports no changes at this time.    Allergies   Allergen Reactions    Other Other (See Comments)     Patient has Sarcoidosis and can't tolerate any metals    Suvorexant Other (See Comments)     Larey Seat out of bed while asleep: I'm waking up as I'm falling  on the floor; Night terrors    Enalapril Maleate      REACTION: cough    Hydrochlorothiazide REACTION: Low potassium    Hydroxychloroquine Sulfate      REACTION: vision changes    Iodine      Mother was intolerant--CODED on CT table---pt never tired.    Latex      Hives    Nickel Other (See Comments)     Pt has a titanium right and left knee - nickel causes skin irritations that forms into blisters and sores  Other reaction(s): Other (See Comments)  Pt has a titanium right knee - nickel causes skin irritations that forms into blisters and sores    Adhesive Tape-Silicones Other (See Comments)     Medical tape causes bruising    Doxycycline Diarrhea and Nausea And Vomiting    Morphine Itching and Nausea Only    Pregabalin Other (See Comments)     Made me feel high       Changes to allergies: No    SPECIALTY MEDICATION ADHERENCE     Humira 40  mg/0.24ml : 7-14 days of medicine on hand       Medication Adherence    Patient reported X missed doses in the last month: 0  Specialty Medication: Humira  Patient is on additional specialty medications: No  Informant: patient          Specialty medication(s) dose(s) confirmed: Regimen is correct and unchanged.     Are there any concerns with adherence? No    Adherence counseling provided? Not needed    CLINICAL MANAGEMENT AND INTERVENTION      Clinical Benefit Assessment:    Do you feel the medicine is effective or helping your condition? Yes    Clinical Benefit counseling provided? Not needed    Adverse Effects Assessment:    Are you experiencing any side effects? No    Are you experiencing difficulty administering your medicine? No    Quality of Life Assessment:    Quality of Life    Rheumatology  Oncology  Dermatology  Cystic Fibrosis          How many days over the past month did your Sarcoid  keep you from your normal activities? For example, brushing your teeth or getting up in the morning. 0    Have you discussed this with your provider? Not needed    Acute Infection Status:    Acute infections noted within Epic:  No active infections  Patient reported infection: None    Therapy Appropriateness:    Is therapy appropriate and patient progressing towards therapeutic goals? Yes, therapy is appropriate and should be continued    DISEASE/MEDICATION-SPECIFIC INFORMATION      For patients on injectable medications: Patient currently has 1-2 doses left.  Next injection is scheduled for 08/15/21.    PATIENT SPECIFIC NEEDS     Does the patient have any physical, cognitive, or cultural barriers? No    Is the patient high risk? No    Does the patient require a Care Management Plan? No     SOCIAL DETERMINANTS OF HEALTH     At the [facility], we have learned that life circumstances - like trouble affording food, housing, utilities, or transportation can affect the health of many of our patients.   That is why we wanted to ask: are you currently experiencing any life circumstances that are negatively impacting your health and/or quality of life? Patient declined to answer  Social Determinants of Psychologist, prison and probation services Strain: Not on file   Internet Connectivity: Not on file   Food Insecurity: Not on file   Tobacco Use: Medium Risk    Smoking Tobacco Use: Former    Smokeless Tobacco Use: Never    Passive Exposure: Not on file   Housing/Utilities: Not on file   Alcohol Use: Not on file   Transportation Needs: Not on file   Substance Use: Not on file   Health Literacy: Not on file   Physical Activity: Not on file   Interpersonal Safety: Not on file   Stress: Not on file   Intimate Partner Violence: Not on file   Depression: Not on file   Social Connections: Not on file       Would you be willing to receive help with any of the needs that you have identified today? Not applicable       SHIPPING     Specialty Medication(s) to be Shipped:   Inflammatory Disorders: Humira    Other medication(s) to be shipped: No additional medications requested for fill at this time     Changes to insurance: No    Delivery Scheduled: Patient declined refill at this time due to rehab facility providing at this time.     Medication will be delivered via  n/a  to the confirmed  n/a  address in [facility].    The patient will receive a drug information handout for each medication shipped and additional FDA Medication Guides as required.  Verified that patient has previously received a Conservation officer, historic buildings and a Surveyor, mining.    The patient or caregiver noted above participated in the development of this care plan and knows that they can request review of or adjustments to the care plan at any time.      All of the patient's questions and concerns have been addressed.    Arnold Long   [facility] Specialty Pharmacist

## 2021-08-09 NOTE — Patient Outreach (Signed)
[place] Coordinator follow up.   Message received from [place] indicating Ms. Prosser decided to stay at [facility]. Therefore, she will not transfer to [place] after all. Voice message left for Braylynn, [place] [facility] Admissions Coordinator to be sure she was aware.   Will continue to follow while Ms. Brede remains in [place].    Marthenia Rolling, MSN, RN,BSN [place] Acute Care Coordinator [phone] [facility]) [phone]  (Toll free office)

## 2021-08-11 ENCOUNTER — Encounter: Payer: Self-pay | Admitting: Orthopedic Surgery

## 2021-08-13 ENCOUNTER — Ambulatory Visit: Payer: Medicare Other | Admitting: Orthopedic Surgery

## 2021-08-13 DIAGNOSIS — R300 Dysuria: Secondary | ICD-10-CM | POA: Diagnosis not present

## 2021-08-13 DIAGNOSIS — R197 Diarrhea, unspecified: Secondary | ICD-10-CM | POA: Diagnosis not present

## 2021-08-14 ENCOUNTER — Other Ambulatory Visit: Payer: Self-pay | Admitting: *Deleted

## 2021-08-14 DIAGNOSIS — I10 Essential (primary) hypertension: Secondary | ICD-10-CM

## 2021-08-14 NOTE — Patient Outreach (Incomplete)

## 2021-08-15 ENCOUNTER — Telehealth: Payer: Self-pay | Admitting: *Deleted

## 2021-08-15 DIAGNOSIS — I2510 Atherosclerotic heart disease of native coronary artery without angina pectoris: Secondary | ICD-10-CM | POA: Diagnosis not present

## 2021-08-15 DIAGNOSIS — G4733 Obstructive sleep apnea (adult) (pediatric): Secondary | ICD-10-CM | POA: Diagnosis not present

## 2021-08-15 DIAGNOSIS — S72409S Unspecified fracture of lower end of unspecified femur, sequela: Secondary | ICD-10-CM | POA: Diagnosis not present

## 2021-08-15 DIAGNOSIS — E1122 Type 2 diabetes mellitus with diabetic chronic kidney disease: Secondary | ICD-10-CM | POA: Diagnosis not present

## 2021-08-15 DIAGNOSIS — M6281 Muscle weakness (generalized): Secondary | ICD-10-CM | POA: Diagnosis not present

## 2021-08-15 DIAGNOSIS — Z4789 Encounter for other orthopedic aftercare: Secondary | ICD-10-CM | POA: Diagnosis not present

## 2021-08-15 DIAGNOSIS — J9611 Chronic respiratory failure with hypoxia: Secondary | ICD-10-CM | POA: Diagnosis not present

## 2021-08-15 DIAGNOSIS — J449 Chronic obstructive pulmonary disease, unspecified: Secondary | ICD-10-CM | POA: Diagnosis not present

## 2021-08-15 NOTE — Chronic Care Management (AMB) (Signed)
  Care Coordination   Note   08/15/2021 Name: Jenipher Havel MRN: [id number] DOB: 1960/10/13  Mandie Crabbe is a 61 y.o. year old female who sees Plotnikov, Evie Lacks, MD for primary care. I reached out to [place] by phone today to offer care coordination services.  Ms. Monte was given information about Care Coordination services today including:   The Care Coordination services include support from the care team which includes your Nurse Coordinator, Clinical Social Worker, or Pharmacist.  The Care Coordination team is here to help remove barriers to the health concerns and goals most important to you. Care Coordination services are voluntary, and the patient may decline or stop services at any time by request to their care team member.   Care Coordination Consent Status: Patient agreed to services and verbal consent obtained.   Follow up plan:  Telephone appointment with care coordination team member scheduled for:  08/21/2021  Encounter Outcome:  Pt. Scheduled  Julian Hy, [place] Direct Dial: [phone]

## 2021-08-17 DIAGNOSIS — N390 Urinary tract infection, site not specified: Secondary | ICD-10-CM | POA: Diagnosis not present

## 2021-08-17 DIAGNOSIS — R197 Diarrhea, unspecified: Secondary | ICD-10-CM | POA: Diagnosis not present

## 2021-08-17 DIAGNOSIS — J9611 Chronic respiratory failure with hypoxia: Secondary | ICD-10-CM | POA: Diagnosis not present

## 2021-08-18 DIAGNOSIS — F32A Depression, unspecified: Secondary | ICD-10-CM | POA: Diagnosis not present

## 2021-08-18 DIAGNOSIS — G4733 Obstructive sleep apnea (adult) (pediatric): Secondary | ICD-10-CM | POA: Diagnosis not present

## 2021-08-18 DIAGNOSIS — Z86718 Personal history of other venous thrombosis and embolism: Secondary | ICD-10-CM | POA: Diagnosis not present

## 2021-08-18 DIAGNOSIS — Z7951 Long term (current) use of inhaled steroids: Secondary | ICD-10-CM | POA: Diagnosis not present

## 2021-08-18 DIAGNOSIS — Z7984 Long term (current) use of oral hypoglycemic drugs: Secondary | ICD-10-CM | POA: Diagnosis not present

## 2021-08-18 DIAGNOSIS — M069 Rheumatoid arthritis, unspecified: Secondary | ICD-10-CM | POA: Diagnosis not present

## 2021-08-18 DIAGNOSIS — K219 Gastro-esophageal reflux disease without esophagitis: Secondary | ICD-10-CM | POA: Diagnosis not present

## 2021-08-18 DIAGNOSIS — M1990 Unspecified osteoarthritis, unspecified site: Secondary | ICD-10-CM | POA: Diagnosis not present

## 2021-08-18 DIAGNOSIS — G4700 Insomnia, unspecified: Secondary | ICD-10-CM | POA: Diagnosis not present

## 2021-08-18 DIAGNOSIS — Z8673 Personal history of transient ischemic attack (TIA), and cerebral infarction without residual deficits: Secondary | ICD-10-CM | POA: Diagnosis not present

## 2021-08-18 DIAGNOSIS — Z7901 Long term (current) use of anticoagulants: Secondary | ICD-10-CM | POA: Diagnosis not present

## 2021-08-18 DIAGNOSIS — D869 Sarcoidosis, unspecified: Secondary | ICD-10-CM | POA: Diagnosis not present

## 2021-08-18 DIAGNOSIS — Z79620 Long term (current) use of immunosuppressive biologic: Secondary | ICD-10-CM | POA: Diagnosis not present

## 2021-08-18 DIAGNOSIS — E559 Vitamin D deficiency, unspecified: Secondary | ICD-10-CM | POA: Diagnosis not present

## 2021-08-18 DIAGNOSIS — Z9981 Dependence on supplemental oxygen: Secondary | ICD-10-CM | POA: Diagnosis not present

## 2021-08-18 DIAGNOSIS — M797 Fibromyalgia: Secondary | ICD-10-CM | POA: Diagnosis not present

## 2021-08-18 DIAGNOSIS — Z87891 Personal history of nicotine dependence: Secondary | ICD-10-CM | POA: Diagnosis not present

## 2021-08-18 DIAGNOSIS — Z9181 History of falling: Secondary | ICD-10-CM | POA: Diagnosis not present

## 2021-08-18 DIAGNOSIS — E538 Deficiency of other specified B group vitamins: Secondary | ICD-10-CM | POA: Diagnosis not present

## 2021-08-18 DIAGNOSIS — I10 Essential (primary) hypertension: Secondary | ICD-10-CM | POA: Diagnosis not present

## 2021-08-18 DIAGNOSIS — E119 Type 2 diabetes mellitus without complications: Secondary | ICD-10-CM | POA: Diagnosis not present

## 2021-08-18 DIAGNOSIS — W19XXXD Unspecified fall, subsequent encounter: Secondary | ICD-10-CM | POA: Diagnosis not present

## 2021-08-18 DIAGNOSIS — S72432D Displaced fracture of medial condyle of left femur, subsequent encounter for closed fracture with routine healing: Secondary | ICD-10-CM | POA: Diagnosis not present

## 2021-08-21 ENCOUNTER — Telehealth: Payer: Self-pay | Admitting: *Deleted

## 2021-08-21 ENCOUNTER — Ambulatory Visit: Payer: Self-pay

## 2021-08-21 NOTE — Patient Instructions (Signed)
Visit Information  Thank you for taking time to visit with me today. Please don't hesitate to contact me if I can be of assistance to you.   Following are the goals we discussed today:   Goals Addressed             This Visit's Progress    Patient Stated: Continue to improve post SNF rehab stay       Care Coordination Interventions: Advised patient to contact home health agency if she does not receiving date for sessions this week Reviewed medications with patient and discussed importance of taking as prescribed Social Work referral for counseling needs Assessed social determinant of health barriers Patient experience contact number discussed/provided Encouraged to participate in recommended exercises by physical therapist Reviewed upcoming appointments Confirmed patient has transportation to provider visits        Our next appointment is by telephone on 09/05/21 at 1:30 pm  Please call the care guide team at [phone] if you need to cancel or reschedule your appointment.   If you are experiencing a Mental Health or [place] or need someone to talk to, please call the Suicide and Crisis Lifeline: 988  Patient verbalizes understanding of instructions and care plan provided today and agrees to view in [place]. Active MyChart status and patient understanding of how to access instructions and care plan via MyChart confirmed with patient.     The patient has been provided with contact information for the care management team and has been advised to call with any health related questions or concerns.   [name], RN, MSN, BSN, CCM Care Coordinator [phone]

## 2021-08-21 NOTE — Patient Outreach (Addendum)
  Care Coordination   Initial Visit Note   08/21/2021 Name: [name] MRN: [id number] DOB: [birth year]  [name] is a 61 y.o. year old female who sees Plotnikov, Evie Lacks, MD for primary care. I spoke with  [name] by phone today  What matters to the patients health and wellness today?  Continue improvement post rehab stay. Patient would like to speak with clinical therapist for counseling surrounding skilled facility stay experience.    Goals Addressed             This Visit's Progress    Care Coordinaton: Continue to improve post SNF rehab stay       Care Coordination Interventions: Advised patient to contact home health agency if she does not receiving date for sessions this week Reviewed medications with patient and discussed importance of taking as prescribed Social Work referral for counseling needs Assessed social determinant of health barriers Patient experience contact number discussed/provided [phone] Encouraged to participate in recommended exercises by physical therapist Reviewed upcoming appointments Confirmed patient has transportation to provider visits        SDOH assessments and interventions completed:  Yes  SDOH Interventions Today    Flowsheet Row Most Recent Value  SDOH Interventions   Food Insecurity Interventions Intervention Not Indicated  Transportation Interventions Intervention Not Indicated  [states uses Medicaid Transportation]        Care Coordination Interventions Activated:  Yes  Care Coordination Interventions:  Yes, provided   Follow up plan: Follow up call scheduled for 09/05/21    Encounter Outcome:  Pt. Visit Completed   [name], RN, MSN, BSN, [place] Coordinator [phone]

## 2021-08-21 NOTE — Chronic Care Management (AMB) (Signed)
  Care Coordination   Note   08/21/2021 Name: [name] MRN: [id number] DOB: 16-Jun-1960  [name] is a 61 y.o. year old female who sees Plotnikov, Evie Lacks, MD for primary care. I reached out to [place] by phone today to offer care coordination services.  Additional referral received   Ms. Rodriques was given information about Care Coordination services today including:   The Care Coordination services include support from the care team which includes your Nurse Coordinator, Clinical Social Worker, or Pharmacist.  The Care Coordination team is here to help remove barriers to the health concerns and goals most important to you. Care Coordination services are voluntary, and the patient may decline or stop services at any time by request to their care team member.   Care Coordination Consent Status: Patient agreed to services and verbal consent obtained.   Follow up plan:  Telephone appointment with care coordination team member scheduled for:  08/22/2021  Encounter Outcome:  Pt. Scheduled

## 2021-08-22 ENCOUNTER — Ambulatory Visit: Payer: Self-pay | Admitting: Licensed Clinical Social Worker

## 2021-08-22 DIAGNOSIS — Z87891 Personal history of nicotine dependence: Secondary | ICD-10-CM | POA: Diagnosis not present

## 2021-08-22 DIAGNOSIS — W19XXXD Unspecified fall, subsequent encounter: Secondary | ICD-10-CM | POA: Diagnosis not present

## 2021-08-22 DIAGNOSIS — E559 Vitamin D deficiency, unspecified: Secondary | ICD-10-CM | POA: Diagnosis not present

## 2021-08-22 DIAGNOSIS — Z86718 Personal history of other venous thrombosis and embolism: Secondary | ICD-10-CM | POA: Diagnosis not present

## 2021-08-22 DIAGNOSIS — E538 Deficiency of other specified B group vitamins: Secondary | ICD-10-CM | POA: Diagnosis not present

## 2021-08-22 DIAGNOSIS — F32A Depression, unspecified: Secondary | ICD-10-CM | POA: Diagnosis not present

## 2021-08-22 DIAGNOSIS — G4733 Obstructive sleep apnea (adult) (pediatric): Secondary | ICD-10-CM | POA: Diagnosis not present

## 2021-08-22 DIAGNOSIS — Z9181 History of falling: Secondary | ICD-10-CM | POA: Diagnosis not present

## 2021-08-22 DIAGNOSIS — D869 Sarcoidosis, unspecified: Secondary | ICD-10-CM | POA: Diagnosis not present

## 2021-08-22 DIAGNOSIS — Z79620 Long term (current) use of immunosuppressive biologic: Secondary | ICD-10-CM | POA: Diagnosis not present

## 2021-08-22 DIAGNOSIS — S72432D Displaced fracture of medial condyle of left femur, subsequent encounter for closed fracture with routine healing: Secondary | ICD-10-CM | POA: Diagnosis not present

## 2021-08-22 DIAGNOSIS — Z9981 Dependence on supplemental oxygen: Secondary | ICD-10-CM | POA: Diagnosis not present

## 2021-08-22 DIAGNOSIS — I10 Essential (primary) hypertension: Secondary | ICD-10-CM | POA: Diagnosis not present

## 2021-08-22 DIAGNOSIS — G4700 Insomnia, unspecified: Secondary | ICD-10-CM | POA: Diagnosis not present

## 2021-08-22 DIAGNOSIS — K219 Gastro-esophageal reflux disease without esophagitis: Secondary | ICD-10-CM | POA: Diagnosis not present

## 2021-08-22 DIAGNOSIS — Z7984 Long term (current) use of oral hypoglycemic drugs: Secondary | ICD-10-CM | POA: Diagnosis not present

## 2021-08-22 DIAGNOSIS — E119 Type 2 diabetes mellitus without complications: Secondary | ICD-10-CM | POA: Diagnosis not present

## 2021-08-22 DIAGNOSIS — Z7901 Long term (current) use of anticoagulants: Secondary | ICD-10-CM | POA: Diagnosis not present

## 2021-08-22 DIAGNOSIS — M1990 Unspecified osteoarthritis, unspecified site: Secondary | ICD-10-CM | POA: Diagnosis not present

## 2021-08-22 DIAGNOSIS — Z7951 Long term (current) use of inhaled steroids: Secondary | ICD-10-CM | POA: Diagnosis not present

## 2021-08-22 DIAGNOSIS — M797 Fibromyalgia: Secondary | ICD-10-CM | POA: Diagnosis not present

## 2021-08-22 DIAGNOSIS — M069 Rheumatoid arthritis, unspecified: Secondary | ICD-10-CM | POA: Diagnosis not present

## 2021-08-22 DIAGNOSIS — Z8673 Personal history of transient ischemic attack (TIA), and cerebral infarction without residual deficits: Secondary | ICD-10-CM | POA: Diagnosis not present

## 2021-08-22 NOTE — Patient Instructions (Signed)
Visit Information  Thank you for taking time to visit with me today. Please don't hesitate to contact me if I can be of assistance to you.   Following are the goals we discussed today: Managing your emotions  Goals Addressed             This Visit's Progress    manage emotions       Care Coordination Interventions: Solution-Focused Strategies employed:  Active listening / Reflection utilized  Reviewed mental health medications and discussed importance of compliance: reports no missed dose Discussed referral options to assist with connecting to mental health provider Made referral to [facility] [phone] [email or web address]      Our next appointment is by telephone on Aug. 28th    Please call the care guide team at [phone] if you need to cancel or reschedule your appointment.   If you are experiencing a Mental Health or [place] or need someone to talk to, please call the Suicide and Crisis Lifeline: 988 call 1-800-273-TALK (toll free, 24 hour hotline)   Patient verbalizes understanding of instructions and care plan provided today and agrees to view in [place]. Active MyChart status and patient understanding of how to access instructions and care plan via MyChart confirmed with patient.     [name], [place] [phone]

## 2021-08-22 NOTE — Patient Outreach (Signed)
  Care Coordination  Initial Visit Note   08/22/2021 Name: [name] MRN: [id number] DOB: 09-17-1960  [name] is a 61 y.o. year old female who sees Plotnikov, Evie Lacks, MD for primary care. I spoke with  [name] by phone today  What matters to the patients health and wellness today?  Finding a therapist   Patient continues to experience anxiety and stress which seems to be exacerbated by her recent stay at SNF.[name]   Recommendation: Patient may benefit from, and is in agreement  For LCSW to make referral to [facility].     Goals Addressed             This Visit's Progress    manage emotions       Care Coordination Interventions: Solution-Focused Strategies employed:  Active listening / Reflection utilized  Reviewed mental health medications and discussed importance of compliance: reports no missed dose Discussed referral options to assist with connecting to mental health provider Made referral to [facility] [phone] [email or web address]       SDOH assessments and interventions completed:  Yes  SDOH Interventions Today    Flowsheet Row Most Recent Value  SDOH Interventions   SDOH Interventions for the Following Domains Depression, Stress  Stress Interventions Other (Comment)  Depression Interventions/Treatment  Counseling  [referral placed for ongoing therapy]       Care Coordination Interventions Activated:  Yes  Care Coordination Interventions:  Yes, provided   Follow up plan: Follow up call scheduled for Aug. 28th    Encounter Outcome:  Pt. Visit Completed   Casimer Lanius, [place] [phone]

## 2021-08-24 ENCOUNTER — Ambulatory Visit (INDEPENDENT_AMBULATORY_CARE_PROVIDER_SITE_OTHER): Payer: Medicare Other

## 2021-08-24 ENCOUNTER — Encounter: Payer: Self-pay | Admitting: Surgical

## 2021-08-24 ENCOUNTER — Ambulatory Visit (INDEPENDENT_AMBULATORY_CARE_PROVIDER_SITE_OTHER): Payer: Medicare Other | Admitting: Surgical

## 2021-08-24 DIAGNOSIS — M25562 Pain in left knee: Secondary | ICD-10-CM

## 2021-08-24 NOTE — Progress Notes (Signed)
Post-Op Visit Note   Patient: [name]           Date of Birth: 04/19/1960           MRN: [id number] Visit Date: 08/24/2021 PCP: Cassandria Anger, MD   Assessment & Plan:  Chief Complaint:  Chief Complaint  Patient presents with   Left Knee - Routine Post Op   Visit Diagnoses:  1. Left knee pain, unspecified chronicity     Plan: Patient is a 61 year old female who presents s/p left knee medial femoral condyle ORIF on 07/31/2021.  She is doing very well overall with pain controlled.  She has discontinued her pain medication.  She has resumed her Xarelto and is taking that without interruption.  Denies any chest pain, shortness of breath, calf pain.  No fevers or chills.  She stayed initially at [facility] but states that she had a terrible experience there and discharged herself and she has now living at her house alone.  She is functioning well and ambulating with a walker.  No instability of the knee or any mechanical symptoms.  On exam, patient has incision that is healing well without evidence of infection or dehiscence.  Range of motion of the left knee from 0 degrees extension to 105 degrees of knee flexion.  No calf tenderness bilaterally.  Negative Homans' sign bilaterally.  She is able to perform straight leg raise multiple times without extensor lag.  She ambulates without significant difficulty with a walker.  Plan is to continue with home exercise program as she has been doing based on exercises she did following her knee replacements.  She does have home health physical therapy coming out to her house tomorrow but states that if this is not really that useful she will just continue with exercises by herself which I think is reasonable.  Follow-up in 4 weeks for clinical recheck with Dr. Marlou Sa.  Follow-Up Instructions: No follow-ups on file.   Orders:  Orders Placed This Encounter  Procedures   XR Knee 1-2 Views Left   No orders of the defined types  were placed in this encounter.   Imaging: No results found.  PMFS History: Patient Active Problem List   Diagnosis Date Noted   Closed fracture of distal end of femur, unspecified fracture morphology, unspecified laterality, initial encounter ([place]) 07/31/2021   Nasolacrimal duct obstruction, acquired, bilateral 03/23/2021   Chronic pain disorder 03/23/2021   Dacrocystitis, left 11/20/2020   Cellulitis, face 11/20/2020   Well adult exam 07/19/2020   Benign neoplasm of ascending colon    Benign neoplasm of descending colon    Benign neoplasm of sigmoid colon    Chronic diarrhea    Diarrhea 07/06/2019   Shoulder pain, bilateral 02/01/2019   Physical deconditioning 01/28/2019   Chronic anticoagulation 11/19/2018   Insulin dependent diabetes mellitus type IA ( [place]) 11/19/2018   Oxygen dependent 11/19/2018   Family history of colon cancer 10/26/2018   Myringotomy tube status 08/21/2018   Vaginal candidiasis 07/15/2018   Nasal crusting 06/12/2018   Chronic serous otitis media of left ear 06/04/2018   Mixed conductive and sensorineural hearing loss of left ear with restricted hearing of right ear 06/04/2018   Chronic cholecystitis with calculus 02/11/2018   Thrush, oral 01/15/2018   Nasal sinus congestion 01/15/2018   Dyslipidemia 10/28/2017   Pruritus 10/23/2017   Abnormal liver function test    Anemia 09/24/2017   Migraines 09/17/2017   Cholecystitis, acute 09/12/2017   COPD (chronic obstructive pulmonary  disease) ([place]) 72/53/6644   Diastolic dysfunction 03/47/4259   OSA (obstructive sleep apnea) 09/30/2016   Chronic respiratory failure with hypoxia ([place]) 08/23/2016   Pulmonary embolism ([place]) 07/21/2016   DVT (deep venous thrombosis) ([place]) 07/21/2016   Acute bronchitis 01/30/2016   Weight gain 12/20/2015   Controlled type 2 diabetes mellitus with chronic kidney disease, without long-term current use of insulin ([place]) 02/01/2015   Abnormal SPEP 06/04/2013   Memory loss  04/27/2013   Panic attacks 03/17/2013   Rash 10/27/2012   Pulmonary nodules 07/20/2012   CAD in native artery 05/28/2012   Seborrheic dermatitis 01/20/2012   Obesity, Class III, BMI 40-49.9 (morbid obesity) ([place]) 03/07/2010   INSOMNIA, CHRONIC 03/07/2010   Allergic rhinitis 10/26/2009   Grief reaction 08/01/2009   Essential hypertension 08/27/2007   B12 deficiency 08/25/2007   Vitamin D deficiency 08/25/2007   Urinary tract infection 02/07/2007   SINUSITIS, ACUTE 02/02/2007   Osteoarthritis 02/02/2007   Sarcoidosis 08/14/2006   Anxiety disorder 08/14/2006   Major depressive disorder, recurrent episode ([place]) 08/14/2006   GERD (gastroesophageal reflux disease) 08/14/2006   Past Medical History:  Diagnosis Date   ALLERGIC RHINITIS 10/26/2009   no current problems   Anxiety    B12 DEFICIENCY 08/25/2007   takes B12 supplement   Complication of anesthesia    pt has had difficulty following anesthesia with her knee in 2016-unable to care for herself afterward   Depression    takes effexor xr daily   Diabetes mellitus without complication ([place])    was on insulin but has been off since Nov 2015 and now only takes Metformin daily   DYSPNEA 04/28/2009   uses oxygen 24/7, on 2L via [place]   Esophageal reflux    takes Nexium daily   Fibromyalgia    Headache    last migraine 2-13yr ago;takes Topamax daily   History of shingles    Hypertension    takes Coreg daily   Insomnia    takes Nortriptyline nightly    Long-term memory loss    OSA (obstructive sleep apnea)    doesn't use CPAP;sleep study in epic from 2006   Osteoarthritis    Panic attacks    PONV (postoperative nausea and vomiting)    on time in 2016 knee surgery   Rheumatoid arthritis (HCC)    Sarcoidosis    Dr. ZLolita Patella  Stroke ([facility]    Vitamin D deficiency    is supposed to take Vit D but can't afford it    Family History  Problem Relation Age of Onset   Heart disease Mother    Kidney disease Mother        renal  failure   Cancer Father        leukemia   Hypertension Other    Coronary artery disease Other        female 1st degree relative <60   Heart failure Other        congestive   Coronary artery disease Other        Female 1st degree relative <50   Breast cancer Other        1st degree relative <50 S   Breast cancer Sister    Colon cancer Brother 568  Stroke Brother    Heart attack Brother    Cancer Brother 55      colon ca   Esophageal cancer Neg Hx    Stomach cancer Neg Hx    Rectal cancer Neg Hx  Past Surgical History:  Procedure Laterality Date   APPENDECTOMY     arthroscopic knee surgery Right 11-12-04   AXILLARY ABCESS IRRIGATION AND DEBRIDEMENT  Jul & Aug2012   BIOPSY  11/02/2019   Procedure: BIOPSY;  Surgeon: Jerene Bears, MD;  Location: [facility];  Service: Gastroenterology;;   CARPAL TUNNEL RELEASE Left 05/23/2014   Procedure: CARPAL TUNNEL RELEASE;  Surgeon: Meredith Pel, MD;  Location: [place];  Service: Orthopedics;  Laterality: Left;   CHOLECYSTECTOMY N/A 02/11/2018   Procedure: LAPAROSCOPIC CHOLECYSTECTOMY WITH INTRAOPERATIVE CHOLANGIOGRAM ERAS PATHWAY;  Surgeon: Donnie Mesa, MD;  Location: [place];  Service: General;  Laterality: N/A;   COLONOSCOPY WITH PROPOFOL N/A 11/02/2019   Procedure: COLONOSCOPY WITH PROPOFOL;  Surgeon: Jerene Bears, MD;  Location: [facility];  Service: Gastroenterology;  Laterality: N/A;   cyst removed from top of buttocks  at age 12   DACRORHINOCYSTOTOMY Bilateral 03/23/2021   Procedure: ENDOSCOPIC DACROCYSTORHINOSTOMY;  Surgeon: Delia Chimes, MD;  Location: [place];  Service: Ophthalmology;  Laterality: Bilateral;   ENDOMETRIAL ABLATION     IR CHOLANGIOGRAM EXISTING TUBE  11/21/2017   IR CHOLANGIOGRAM EXISTING TUBE  01/16/2018   IR EXCHANGE BILIARY DRAIN  11/10/2017   IR PATIENT EVAL TECH 0-60 MINS  02/03/2018   IR PERC CHOLECYSTOSTOMY  09/13/2017   IR RADIOLOGIST EVAL & MGMT  10/08/2017   LACRIMAL DUCT EXPLORATION Right  06/26/2017   Procedure: LACRIMAL DUCT EXPLORATION AND ETHMOIDECTOMY;  Surgeon: Clista Bernhardt, MD;  Location: [place];  Service: Ophthalmology;  Laterality: Right;   LACRIMAL DUCT EXPLORATION Bilateral 03/23/2021   Procedure: NASAL LACRIMAL DUCT EXPLORATION;  Surgeon: Delia Chimes, MD;  Location: [place];  Service: Ophthalmology;  Laterality: Bilateral;   LACRIMAL TUBE INSERTION Bilateral 03/23/2021   Procedure: INSERTION OF CRAWFORD LACRIMAL STENT;  Surgeon: Delia Chimes, MD;  Location: [place];  Service: Ophthalmology;  Laterality: Bilateral;   ORIF FEMUR FRACTURE Left 07/31/2021   Procedure: left medial femoral condyle fracture fixation;  Surgeon: Meredith Pel, MD;  Location: [place];  Service: Orthopedics;  Laterality: Left;   POLYPECTOMY  11/02/2019   Procedure: POLYPECTOMY;  Surgeon: Jerene Bears, MD;  Location: [facility];  Service: Gastroenterology;;   TEAR DUCT PROBING Right 06/26/2017   Procedure: TEAR DUCT PROBING WITH STENT;  Surgeon: Clista Bernhardt, MD;  Location: [place];  Service: Ophthalmology;  Laterality: Right;   TOTAL KNEE ARTHROPLASTY Right 11/15/2014   Procedure: TOTAL RIGHT KNEE ARTHROPLASTY;  Surgeon: Meredith Pel, MD;  Location: [place];  Service: Orthopedics;  Laterality: Right;   TOTAL KNEE ARTHROPLASTY Left 07/13/2015   Procedure: LEFT TOTAL KNEE ARTHROPLASTY;  Surgeon: Meredith Pel, MD;  Location: [place];  Service: Orthopedics;  Laterality: Left;   Social History   Occupational History   Occupation: disabled  Tobacco Use   Smoking status: Former    Packs/day: 0.50    Years: 10.00    Total pack years: 5.00    Types: Cigarettes    Start date: 1988    Quit date: 2004    Years since quitting: 19.6   Smokeless tobacco: Never   Tobacco comments:    quit smoking in 2004  Vaping Use   Vaping Use: Never used  Substance and Sexual Activity   Alcohol use: Not Currently    Alcohol/week: 1.0 standard drink of alcohol    Types: 1 Glasses of wine  per week   Drug use: No   Sexual activity: Not Currently  Birth control/protection: Post-menopausal

## 2021-08-25 DIAGNOSIS — E538 Deficiency of other specified B group vitamins: Secondary | ICD-10-CM | POA: Diagnosis not present

## 2021-08-25 DIAGNOSIS — G4733 Obstructive sleep apnea (adult) (pediatric): Secondary | ICD-10-CM | POA: Diagnosis not present

## 2021-08-25 DIAGNOSIS — Z86718 Personal history of other venous thrombosis and embolism: Secondary | ICD-10-CM | POA: Diagnosis not present

## 2021-08-25 DIAGNOSIS — Z9181 History of falling: Secondary | ICD-10-CM | POA: Diagnosis not present

## 2021-08-25 DIAGNOSIS — F32A Depression, unspecified: Secondary | ICD-10-CM | POA: Diagnosis not present

## 2021-08-25 DIAGNOSIS — M1990 Unspecified osteoarthritis, unspecified site: Secondary | ICD-10-CM | POA: Diagnosis not present

## 2021-08-25 DIAGNOSIS — W19XXXD Unspecified fall, subsequent encounter: Secondary | ICD-10-CM | POA: Diagnosis not present

## 2021-08-25 DIAGNOSIS — Z7901 Long term (current) use of anticoagulants: Secondary | ICD-10-CM | POA: Diagnosis not present

## 2021-08-25 DIAGNOSIS — E559 Vitamin D deficiency, unspecified: Secondary | ICD-10-CM | POA: Diagnosis not present

## 2021-08-25 DIAGNOSIS — I10 Essential (primary) hypertension: Secondary | ICD-10-CM | POA: Diagnosis not present

## 2021-08-25 DIAGNOSIS — K219 Gastro-esophageal reflux disease without esophagitis: Secondary | ICD-10-CM | POA: Diagnosis not present

## 2021-08-25 DIAGNOSIS — M069 Rheumatoid arthritis, unspecified: Secondary | ICD-10-CM | POA: Diagnosis not present

## 2021-08-25 DIAGNOSIS — E119 Type 2 diabetes mellitus without complications: Secondary | ICD-10-CM | POA: Diagnosis not present

## 2021-08-25 DIAGNOSIS — Z9981 Dependence on supplemental oxygen: Secondary | ICD-10-CM | POA: Diagnosis not present

## 2021-08-25 DIAGNOSIS — Z8673 Personal history of transient ischemic attack (TIA), and cerebral infarction without residual deficits: Secondary | ICD-10-CM | POA: Diagnosis not present

## 2021-08-25 DIAGNOSIS — Z7951 Long term (current) use of inhaled steroids: Secondary | ICD-10-CM | POA: Diagnosis not present

## 2021-08-25 DIAGNOSIS — Z87891 Personal history of nicotine dependence: Secondary | ICD-10-CM | POA: Diagnosis not present

## 2021-08-25 DIAGNOSIS — D869 Sarcoidosis, unspecified: Secondary | ICD-10-CM | POA: Diagnosis not present

## 2021-08-25 DIAGNOSIS — G4700 Insomnia, unspecified: Secondary | ICD-10-CM | POA: Diagnosis not present

## 2021-08-25 DIAGNOSIS — S72432D Displaced fracture of medial condyle of left femur, subsequent encounter for closed fracture with routine healing: Secondary | ICD-10-CM | POA: Diagnosis not present

## 2021-08-25 DIAGNOSIS — Z79620 Long term (current) use of immunosuppressive biologic: Secondary | ICD-10-CM | POA: Diagnosis not present

## 2021-08-25 DIAGNOSIS — M797 Fibromyalgia: Secondary | ICD-10-CM | POA: Diagnosis not present

## 2021-08-25 DIAGNOSIS — Z7984 Long term (current) use of oral hypoglycemic drugs: Secondary | ICD-10-CM | POA: Diagnosis not present

## 2021-08-28 ENCOUNTER — Ambulatory Visit: Payer: Medicare Other | Admitting: *Deleted

## 2021-08-28 ENCOUNTER — Other Ambulatory Visit: Payer: Self-pay | Admitting: Adult Health

## 2021-08-28 ENCOUNTER — Ambulatory Visit (INDEPENDENT_AMBULATORY_CARE_PROVIDER_SITE_OTHER): Payer: Medicare Other | Admitting: Internal Medicine

## 2021-08-28 ENCOUNTER — Encounter: Payer: Self-pay | Admitting: Internal Medicine

## 2021-08-28 DIAGNOSIS — F410 Panic disorder [episodic paroxysmal anxiety] without agoraphobia: Secondary | ICD-10-CM | POA: Diagnosis not present

## 2021-08-28 DIAGNOSIS — N182 Chronic kidney disease, stage 2 (mild): Secondary | ICD-10-CM

## 2021-08-28 DIAGNOSIS — R269 Unspecified abnormalities of gait and mobility: Secondary | ICD-10-CM | POA: Diagnosis not present

## 2021-08-28 DIAGNOSIS — S72409A Unspecified fracture of lower end of unspecified femur, initial encounter for closed fracture: Secondary | ICD-10-CM | POA: Diagnosis not present

## 2021-08-28 DIAGNOSIS — E1122 Type 2 diabetes mellitus with diabetic chronic kidney disease: Secondary | ICD-10-CM | POA: Diagnosis not present

## 2021-08-28 DIAGNOSIS — M159 Polyosteoarthritis, unspecified: Secondary | ICD-10-CM

## 2021-08-28 DIAGNOSIS — F339 Major depressive disorder, recurrent, unspecified: Secondary | ICD-10-CM

## 2021-08-28 MED ORDER — OXYCODONE HCL ER 10 MG PO T12A: 10.0000 mg | EXTENDED_RELEASE_TABLET | Freq: Two times a day (BID) | ORAL | 0 refills | Status: DC

## 2021-08-28 MED ORDER — NALOXONE HCL 4 MG/0.1ML NA LIQD: NASAL | 2 refills | Status: DC

## 2021-08-28 MED ORDER — QUETIAPINE FUMARATE 50 MG PO TABS: ORAL_TABLET | ORAL | 5 refills | Status: DC

## 2021-08-28 NOTE — Progress Notes (Signed)
Subjective:  Patient ID: [name], female    DOB: 05-Oct-1960  Age: 61 y.o. MRN: [id number]  CC: No chief complaint on file.   HPI [name] [facility] presents for FTT, recent leg fx, gait disorder. C/o severe pain, depression, anxiety  Hosp records were reviewed  Outpatient Medications Prior to Visit  Medication Sig Dispense Refill   acetaminophen (TYLENOL) 325 MG tablet Take 1-2 tablets (325-650 mg total) by mouth every 6 (six) hours as needed for mild pain (pain score 1-3 or temp > 100.5). 30 tablet 1   albuterol (VENTOLIN HFA) 108 (90 Base) MCG/ACT inhaler INHALE TWO puffs into THE lungs EVERY SIX HOURS AS NEEDED wheezind AND For SHORTNESS OF BREATH 6.7 g 3   B-D ULTRA-FINE 33 LANCETS MISC Use as instructed to test sugars once  daily 100 each 12   budesonide-formoterol (SYMBICORT) 160-4.5 MCG/ACT inhaler Inhale 2 puffs into the lungs TWICE DAILY (Patient taking differently: 2 puffs 2 (two) times daily.) 10.2 g 5   carvedilol (COREG) 12.5 MG tablet Take 1 tablet (12.5 mg total) by mouth 2 (two) times daily with a meal. 180 tablet 1   Cholecalciferol (VITAMIN D-3) 125 MCG (5000 UT) TABS Take 5,000 Units by mouth daily.     Cyanocobalamin (VITAMIN B-12) 1000 MCG SUBL Place 1 tablet (1,000 mcg total) under the tongue daily. (Patient taking differently: Place 1,000 mcg under the tongue daily.) 100 tablet 3   dexlansoprazole (DEXILANT) 60 MG capsule TAKE ONE CAPSULE BY MOUTH EVERY DAY Annual appt due in July must see provider for future refills 90 capsule 0   gabapentin (NEURONTIN) 300 MG capsule TAKE ONE CAPSULE BY MOUTH EVERY MORNING **May take a senod capsule during THE DAY AS NEEDED FOR PAIN 180 capsule 0   glucose blood (ACCU-CHEK GUIDE) test strip Use to check blood sugar once a day. 100 each 12   HUMIRA PEN 40 MG/0.4ML PNKT Inject 40 mg as directed every Wednesday.      hydrOXYzine (ATARAX) 25 MG tablet Take 1 tablet (25 mg total) by mouth every 8 (eight) hours as  needed. (Patient taking differently: Take 6.25-12.5 mg by mouth See admin instructions. 6.25 mg in the morning, 12.5 mg at bedtime) 60 tablet 3   metFORMIN (GLUCOPHAGE-XR) 500 MG 24 hr tablet Take 2 tablets (1,000 mg total) by mouth at bedtime. Annual appt due in July must see provider for future refills 180 tablet 0   methocarbamol (ROBAXIN) 500 MG tablet Take 1 tablet (500 mg total) by mouth every 6 (six) hours as needed for muscle spasms. 30 tablet 0   Multiple Vitamins-Minerals (MULTIVITAMIN WOMEN 50+) TABS Take 1 tablet by mouth daily.     naloxone (NARCAN) nasal spray 4 mg/0.1 mL Place 1 spray into the nose once as needed (overdose).     neomycin-polymyxin b-dexamethasone (MAXITROL) 3.5-10000-0.1 SUSP Place 1 drop into both eyes 4 (four) times daily as needed (eye irritation).     OXYGEN Inhale 2 L into the lungs continuous.     polyvinyl alcohol (LIQUIFILM TEARS) 1.4 % ophthalmic solution Place 1 drop into both eyes as needed for dry eyes. 15 mL 0   potassium chloride (KLOR-CON M) 10 MEQ tablet Take 1 tablet (10 mEq total) by mouth daily. Follow-up appt is due must see MD for future refills 90 tablet 0   rivaroxaban (XARELTO) 20 MG TABS tablet TAKE ONE TABLET BY MOUTH AT BEDTIME 90 tablet 0   topiramate (TOPAMAX) 50 MG tablet Take 1 tablet (50  mg total) by mouth 2 (two) times daily. 180 tablet 1   venlafaxine XR (EFFEXOR-XR) 75 MG 24 hr capsule Take 1 capsule (75 mg total) by mouth daily with breakfast. 30 capsule 11   naltrexone (DEPADE) 50 MG tablet Take 12.5 mg by mouth at bedtime.      QUEtiapine (SEROQUEL) 25 MG tablet Take 1 in am and 2 at hs 90 tablet 3   neomycin-polymyxin-hydrocortisone (CORTISPORIN) OTIC solution Apply 1 drop to the left hallux at the area of soreness and pain at the toenail once daily until symptoms have resolved (Patient not taking: Reported on 08/21/2021) 10 mL 0   oxyCODONE (OXYCONTIN) 10 mg 12 hr tablet Take 1 tablet (10 mg total) by mouth every 12 (twelve) hours.  (Patient not taking: Reported on 08/21/2021) 14 tablet 0   oxyCODONE 10 MG TABS Take 1 tablet (10 mg total) by mouth every 4 (four) hours. (Patient not taking: Reported on 08/21/2021) 30 tablet 0   No facility-administered medications prior to visit.    ROS: Review of Systems  Constitutional:  Positive for fatigue. Negative for activity change, appetite change, chills and unexpected weight change.  HENT:  Negative for congestion, mouth sores and sinus pressure.   Eyes:  Negative for visual disturbance.  Respiratory:  Negative for cough and chest tightness.   Gastrointestinal:  Negative for abdominal pain and nausea.  Genitourinary:  Negative for difficulty urinating, frequency and vaginal pain.  Musculoskeletal:  Positive for back pain and gait problem.  Skin:  Negative for pallor and rash.  Neurological:  Negative for dizziness, tremors, weakness, numbness and headaches.  Psychiatric/Behavioral:  Positive for dysphoric mood. Negative for confusion, sleep disturbance and suicidal ideas. The patient is nervous/anxious.     Objective:  BP 130/80 (BP Location: Left Arm, Patient Position: Sitting, Cuff Size: Normal)   Pulse 65   Temp 98.9 F (37.2 C) (Oral)   Ht '5\' 7"'$  (1.702 m)   Wt 258 lb (117 kg)   LMP 05/19/2003   SpO2 95%   BMI 40.41 kg/m   BP Readings from Last 3 Encounters:  08/28/21 130/80  08/06/21 107/81  07/02/21 140/90    Wt Readings from Last 3 Encounters:  08/28/21 258 lb (117 kg)  07/31/21 295 lb 10.2 oz (134.1 kg)  07/02/21 269 lb (122 kg)    Physical Exam Constitutional:      General: She is not in acute distress.    Appearance: She is well-developed. She is obese.  HENT:     Head: Normocephalic.     Right Ear: External ear normal.     Left Ear: External ear normal.     Nose: Nose normal.  Eyes:     General:        Right eye: No discharge.        Left eye: No discharge.     Conjunctiva/sclera: Conjunctivae normal.     Pupils: Pupils are equal, round,  and reactive to light.  Neck:     Thyroid: No thyromegaly.     Vascular: No JVD.     Trachea: No tracheal deviation.  Cardiovascular:     Rate and Rhythm: Normal rate and regular rhythm.     Heart sounds: Normal heart sounds.  Pulmonary:     Effort: No respiratory distress.     Breath sounds: No stridor. No wheezing.  Abdominal:     General: Bowel sounds are normal. There is no distension.     Palpations: Abdomen is soft. There  is no mass.     Tenderness: There is no abdominal tenderness. There is no guarding or rebound.  Musculoskeletal:        General: Tenderness present.     Cervical back: Normal range of motion and neck supple. No rigidity.  Lymphadenopathy:     Cervical: No cervical adenopathy.  Skin:    Findings: No erythema or rash.  Neurological:     Mental Status: She is oriented to person, place, and time.     Cranial Nerves: No cranial nerve deficit.     Motor: No abnormal muscle tone.     Coordination: Coordination abnormal.     Gait: Gait abnormal.     Deep Tendon Reflexes: Reflexes normal.  Psychiatric:        Behavior: Behavior normal.        Thought Content: Thought content normal.        Judgment: Judgment normal.   Tearful, upset LLE distal thigh healing wound PCS form was filled out  Lab Results  Component Value Date   WBC 7.2 07/31/2021   HGB 12.3 07/31/2021   HCT 38.6 07/31/2021   PLT 198 07/31/2021   GLUCOSE 115 (H) 07/31/2021   CHOL 210 (H) 07/19/2020   TRIG 109.0 07/19/2020   HDL 77.20 07/19/2020   LDLDIRECT 107.0 06/30/2017   LDLCALC 111 (H) 07/19/2020   ALT 15 07/02/2021   AST 18 07/02/2021   NA 144 07/31/2021   K 3.9 07/31/2021   CL 107 07/31/2021   CREATININE 0.88 07/31/2021   BUN 22 07/31/2021   CO2 22 07/31/2021   TSH 1.18 07/02/2021   INR 1.00 02/11/2018   HGBA1C 5.1 03/21/2021   MICROALBUR <0.7 02/01/2015    DG Knee Left Port  Result Date: 07/31/2021 CLINICAL DATA:  Postop. EXAM: PORTABLE LEFT KNEE - 1-2 VIEW  COMPARISON:  Preoperative radiographs. FINDINGS: Medial plate and multi screw fixation of periprosthetic distal femur fracture. 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 Chronic ossified densities in the region of the quadriceps and patellar tendons. There is a small knee joint effusion. IMPRESSION: Medial plate and screw fixation of periprosthetic distal femur fracture. No immediate postoperative complication. Electronically Signed   By: Keith Rake M.D.   On: 07/31/2021 19:52   DG Knee 1-2 Views Left  Result Date: 07/31/2021 CLINICAL DATA:  Medial left femoral condylar fracture, ORIF EXAM: LEFT KNEE - 1-2 VIEW COMPARISON:  07/25/2021 FINDINGS: 4 fluoroscopic images are obtained during the performance of the procedure and are provided for interpretation only. Lateral plate and screw fixation traverses the medial femoral condylar fracture, with near anatomic alignment. Please refer to operative report. Fluoroscopy time: 32 seconds, 4.08 mGy IMPRESSION: 1. ORIF medial femoral condylar fracture with near anatomic alignment. Electronically Signed   By: Randa Ngo M.D.   On: 07/31/2021 18:29   DG C-Arm 1-60 Min-No Report  Result Date: 07/31/2021 Fluoroscopy was utilized by the requesting physician.  No radiographic interpretation.    Assessment & Plan:   Problem List Items Addressed This Visit     Closed fracture of distal end of femur, unspecified fracture morphology, unspecified laterality, initial encounter ([place])    PCS form was filled out - needs 10 h/month Pt is at home from [facility] Worsening pain Oxycontin Rx  Potential benefits of a long term opioids use as well as potential risks (i.e. addiction risk, apnea etc) and complications (i.e. Somnolence, constipation and others) were explained to the patient and were aknowledged.  Controlled type 2 diabetes mellitus with chronic kidney disease, without long-term current use of insulin (HCC)    On Metformin, Januvia      Gait  disorder    Pt is at home from [facility] form was filled out - needs 80 h/month      Major depressive disorder, recurrent episode ([place])    Worse: seroquel was increased      Obesity, Class III, BMI 40-49.9 (morbid obesity) ([place])    Pt lost wt due to diarrhea       Osteoarthritis    Worsening pain Oxycontin Rx  Potential benefits of a long term opioids use as well as potential risks (i.e. addiction risk, apnea etc) and complications (i.e. Somnolence, constipation and others) were explained to the patient and were aknowledged.       Relevant Medications   oxyCODONE (OXYCONTIN) 10 mg 12 hr tablet   Panic attacks    Worse Will increase Seroquel         Meds ordered this encounter  Medications   QUEtiapine (SEROQUEL) 50 MG tablet    Sig: Take 1 tab in am and 2 tab at HS    Dispense:  90 tablet    Refill:  5   oxyCODONE (OXYCONTIN) 10 mg 12 hr tablet    Sig: Take 1 tablet (10 mg total) by mouth every 12 (twelve) hours.    Dispense:  60 tablet    Refill:  0   naloxone (NARCAN) nasal spray 4 mg/0.1 mL    Sig: 1 actuation in one nostril once. May repeat in 2-3 min    Dispense:  1 each    Refill:  2      Follow-up: Return in about 3 months (around 11/28/2021) for a follow-up visit.  Walker Kehr, MD

## 2021-08-28 NOTE — Assessment & Plan Note (Signed)
Pt lost wt due to diarrhea

## 2021-08-28 NOTE — Assessment & Plan Note (Signed)
Worse Will increase Seroquel

## 2021-08-28 NOTE — Assessment & Plan Note (Signed)
Pt is at home from [facility] form was filled out - needs 80 h/month

## 2021-08-28 NOTE — Assessment & Plan Note (Signed)
Worse: seroquel was increased

## 2021-08-28 NOTE — Assessment & Plan Note (Signed)
On Metformin, Januvia

## 2021-08-28 NOTE — Assessment & Plan Note (Signed)
Worsening pain Oxycontin Rx  Potential benefits of a long term opioids use as well as potential risks (i.e. addiction risk, apnea etc) and complications (i.e. Somnolence, constipation and others) were explained to the patient and were aknowledged.

## 2021-08-28 NOTE — Assessment & Plan Note (Addendum)
PCS form was filled out - needs 80 h/month Pt is at home from [facility] Worsening pain Oxycontin Rx  Potential benefits of a long term opioids use as well as potential risks (i.e. addiction risk, apnea etc) and complications (i.e. Somnolence, constipation and others) were explained to the patient and were aknowledged.

## 2021-08-29 ENCOUNTER — Telehealth: Payer: Self-pay | Admitting: Internal Medicine

## 2021-08-29 ENCOUNTER — Ambulatory Visit (INDEPENDENT_AMBULATORY_CARE_PROVIDER_SITE_OTHER): Payer: Medicare Other

## 2021-08-29 DIAGNOSIS — M1990 Unspecified osteoarthritis, unspecified site: Secondary | ICD-10-CM | POA: Diagnosis not present

## 2021-08-29 DIAGNOSIS — Z7984 Long term (current) use of oral hypoglycemic drugs: Secondary | ICD-10-CM | POA: Diagnosis not present

## 2021-08-29 DIAGNOSIS — Z9981 Dependence on supplemental oxygen: Secondary | ICD-10-CM | POA: Diagnosis not present

## 2021-08-29 DIAGNOSIS — E119 Type 2 diabetes mellitus without complications: Secondary | ICD-10-CM | POA: Diagnosis not present

## 2021-08-29 DIAGNOSIS — E559 Vitamin D deficiency, unspecified: Secondary | ICD-10-CM | POA: Diagnosis not present

## 2021-08-29 DIAGNOSIS — G4700 Insomnia, unspecified: Secondary | ICD-10-CM | POA: Diagnosis not present

## 2021-08-29 DIAGNOSIS — K219 Gastro-esophageal reflux disease without esophagitis: Secondary | ICD-10-CM | POA: Diagnosis not present

## 2021-08-29 DIAGNOSIS — S72432D Displaced fracture of medial condyle of left femur, subsequent encounter for closed fracture with routine healing: Secondary | ICD-10-CM | POA: Diagnosis not present

## 2021-08-29 DIAGNOSIS — M069 Rheumatoid arthritis, unspecified: Secondary | ICD-10-CM | POA: Diagnosis not present

## 2021-08-29 DIAGNOSIS — W19XXXD Unspecified fall, subsequent encounter: Secondary | ICD-10-CM | POA: Diagnosis not present

## 2021-08-29 DIAGNOSIS — G4733 Obstructive sleep apnea (adult) (pediatric): Secondary | ICD-10-CM | POA: Diagnosis not present

## 2021-08-29 DIAGNOSIS — I10 Essential (primary) hypertension: Secondary | ICD-10-CM | POA: Diagnosis not present

## 2021-08-29 DIAGNOSIS — Z79620 Long term (current) use of immunosuppressive biologic: Secondary | ICD-10-CM | POA: Diagnosis not present

## 2021-08-29 DIAGNOSIS — Z86718 Personal history of other venous thrombosis and embolism: Secondary | ICD-10-CM | POA: Diagnosis not present

## 2021-08-29 DIAGNOSIS — E538 Deficiency of other specified B group vitamins: Secondary | ICD-10-CM | POA: Diagnosis not present

## 2021-08-29 DIAGNOSIS — E1142 Type 2 diabetes mellitus with diabetic polyneuropathy: Secondary | ICD-10-CM | POA: Diagnosis not present

## 2021-08-29 DIAGNOSIS — D869 Sarcoidosis, unspecified: Secondary | ICD-10-CM | POA: Diagnosis not present

## 2021-08-29 DIAGNOSIS — L439 Lichen planus, unspecified: Secondary | ICD-10-CM | POA: Diagnosis not present

## 2021-08-29 DIAGNOSIS — M797 Fibromyalgia: Secondary | ICD-10-CM | POA: Diagnosis not present

## 2021-08-29 DIAGNOSIS — F32A Depression, unspecified: Secondary | ICD-10-CM | POA: Diagnosis not present

## 2021-08-29 DIAGNOSIS — Z7901 Long term (current) use of anticoagulants: Secondary | ICD-10-CM | POA: Diagnosis not present

## 2021-08-29 DIAGNOSIS — M722 Plantar fascial fibromatosis: Secondary | ICD-10-CM

## 2021-08-29 DIAGNOSIS — Z7951 Long term (current) use of inhaled steroids: Secondary | ICD-10-CM | POA: Diagnosis not present

## 2021-08-29 DIAGNOSIS — Z87891 Personal history of nicotine dependence: Secondary | ICD-10-CM | POA: Diagnosis not present

## 2021-08-29 DIAGNOSIS — Z8673 Personal history of transient ischemic attack (TIA), and cerebral infarction without residual deficits: Secondary | ICD-10-CM | POA: Diagnosis not present

## 2021-08-29 DIAGNOSIS — Z9181 History of falling: Secondary | ICD-10-CM | POA: Diagnosis not present

## 2021-08-29 NOTE — Progress Notes (Signed)
Patient presents today to pick up diabetic shoes and insoles.  Patient was dispensed 1 pair of diabetic shoes and 3 pairs of foam casted diabetic insoles. Fit was satisfactory. Instructions for break-in and wear was reviewed and a copy was given to the patient.   Re-appointment for regularly scheduled diabetic foot care visits or if they should experience any trouble with the shoes or insoles.  

## 2021-08-29 NOTE — Progress Notes (Deleted)
Office Visit    Patient Name: [name] Date of Encounter: 08/29/2021  Primary Care Provider:  Cassandria Anger, Garrison Primary Cardiologist:  [name] Primary Electrophysiologist: None  Chief Complaint    [name] is a 61 y.o. 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  Past Medical History    Past Medical History:  Diagnosis Date   ALLERGIC RHINITIS 10/26/2009   no current problems   Anxiety    B12 DEFICIENCY 08/25/2007   takes B12 supplement   Complication of anesthesia    pt has had difficulty following anesthesia with her knee in 2016-unable to care for herself afterward   Depression    takes effexor xr daily   Diabetes mellitus without complication ([name])    was on insulin but has been off since Nov 2015 and now only takes Metformin daily   DYSPNEA 04/28/2009   uses oxygen 24/7, on 2L via [place]   Esophageal reflux    takes Nexium daily   Fibromyalgia    Headache    last migraine 2-11yr ago;takes Topamax daily   History of shingles    Hypertension    takes Coreg daily   Insomnia    takes Nortriptyline nightly    Long-term memory loss    OSA (obstructive sleep apnea)    doesn't use CPAP;sleep study in epic from 2006   Osteoarthritis    Panic attacks    PONV (postoperative nausea and vomiting)    on time in 2016 knee surgery   [place]    Sarcoidosis    [name]  Stroke ([facility]    Vitamin D deficiency    is supposed to take Vit D but can't afford it   Past Surgical History:  Procedure Laterality Date   APPENDECTOMY     arthroscopic knee surgery Right 11-12-04   AXILLARY ABCESS IRRIGATION AND DEBRIDEMENT  Jul & Aug2012   BIOPSY  11/02/2019   Procedure: BIOPSY;  Surgeon: [name];  [facility];  Service: Gastroenterology;;    CARPAL TUNNEL RELEASE Left 05/23/2014   Procedure: CARPAL TUNNEL RELEASE;  Surgeon: SMeredith Pel Garrison;  [facility]  Service: Orthopedics;  Laterality: Left;   CHOLECYSTECTOMY N/A 02/11/2018   Procedure: LAPAROSCOPIC CHOLECYSTECTOMY WITH INTRAOPERATIVE CHOLANGIOGRAM ERAS PATHWAY;  Surgeon: TDonnie Mesa Garrison;  [facility]  Service: General;  Laterality: N/A;   COLONOSCOPY WITH PROPOFOL N/A 11/02/2019   Procedure: COLONOSCOPY WITH PROPOFOL;  Surgeon: [name];  [facility];  Service: Gastroenterology;  Laterality: N/A;   cyst removed from top of buttocks  at age 61  DACRORHINOCYSTOTOMY Bilateral 03/23/2021   Procedure: ENDOSCOPIC DACROCYSTORHINOSTOMY;  Surgeon: LDelia Chimes Garrison;  [facility]  Service: Ophthalmology;  Laterality: Bilateral;   ENDOMETRIAL ABLATION     IR CHOLANGIOGRAM EXISTING TUBE  11/21/2017   IR CHOLANGIOGRAM EXISTING TUBE  01/16/2018   IR EXCHANGE BILIARY DRAIN  11/10/2017   IR PATIENT EVAL TECH 0-60 MINS  02/03/2018   IR PERC CHOLECYSTOSTOMY  09/13/2017   IR RADIOLOGIST EVAL & MGMT  10/08/2017   LACRIMAL DUCT EXPLORATION Right 06/26/2017   Procedure: LACRIMAL DUCT EXPLORATION AND ETHMOIDECTOMY;  Surgeon: Angela Bernhardt Garrison;  [facility]  Service: Ophthalmology;  Laterality: Right;   LACRIMAL DUCT EXPLORATION Bilateral 03/23/2021   Procedure: NASAL  LACRIMAL DUCT EXPLORATION;  Surgeon: [name];  Location: [place];  Service: Ophthalmology;  Laterality: Bilateral;   LACRIMAL TUBE INSERTION Bilateral 03/23/2021   Procedure: INSERTION OF CRAWFORD LACRIMAL STENT;  Surgeon: [name];  Location: [place];  Service: Ophthalmology;  Laterality: Bilateral;   ORIF FEMUR FRACTURE Left 07/31/2021   Procedure: left medial femoral condyle fracture fixation;  Surgeon: [name];  Location: [place];  Service: Orthopedics;  Laterality: Left;   POLYPECTOMY  11/02/2019   Procedure: POLYPECTOMY;  Surgeon: Angela Bears, Garrison;   [facility];  Service: Gastroenterology;;   TEAR DUCT PROBING Right 06/26/2017   Procedure: TEAR DUCT PROBING WITH STENT;  Surgeon: [name];  Location: [place];  Service: Ophthalmology;  Laterality: Right;   TOTAL KNEE ARTHROPLASTY Right 11/15/2014   Procedure: TOTAL RIGHT KNEE ARTHROPLASTY;  Surgeon: [name];  Location: [place];  Service: Orthopedics;  Laterality: Right;   TOTAL KNEE ARTHROPLASTY Left 07/13/2015   Procedure: LEFT TOTAL KNEE ARTHROPLASTY;  Surgeon: [name];  Location: [place];  Service: Orthopedics;  Laterality: Left;    Allergies  Allergies  Allergen Reactions   Belsomra [Suvorexant] Other (See Comments)    [place] out of bed while asleep: "I'm waking up as I'm falling on the floor;" "Night terrors"   Enalapril Maleate Cough   Latex Hives   Nickel Other (See Comments)    Blisters  Pt has a titanium right and left knee - nickel causes skin irritations that form into blisters and sores   Other Other (See Comments)    Patient has Sarcoidosis and can't tolerate any metals   Morphine And Related Itching and Nausea Only   Doxycycline Diarrhea and Nausea And Vomiting   Hydrochlorothiazide Other (See Comments)    Low potassium levels    Hydroxychloroquine Sulfate Other (See Comments)    Vision changes    Lyrica [Pregabalin] Other (See Comments)    "Made me feel high"   Tape Other (See Comments)    Medical tape causes bruising    History of Present Illness    [name] is a 61 year old female with the above-mentioned past medical history who presents today for follow-up of hypertension and nonobstructive CAD.  She is on lifelong anticoagulation for history of PE/DVT.  She was initially seen in 2014 for complaint of atypical chest pain.  She underwent Lexiscan Myoview that was normal.  She had TTE completed 11/2016 with EF 60-65% no significant valve disease or signs of pulmonary HTN.    She was seen in  follow-up by [name] in 2020 and reported no complaints of chest pain at that time.  She had received preoperative clearance for gallbladder surgery during visit.  She was last seen by [name] on 08/2019 for annual follow-up.  She was doing well from the cardiovascular perspective but was having concerns with GI issues and frequent diarrhea.  She is on continuous oxygen due to sarcoidosis and is being followed by pulmonary.     Since last being seen in the office patient reports***.  Patient denies chest pain, palpitations, dyspnea, PND, orthopnea, nausea, vomiting, dizziness, syncope, edema, weight gain, or early satiety.   ***Notes:  Home Medications    Current Outpatient Medications  Medication Sig Dispense Refill   acetaminophen (TYLENOL) 325 MG tablet Take 1-2 tablets (325-650 mg total) by mouth every 6 (six) hours as needed for mild pain (pain score 1-3 or temp > 100.5).  30 tablet 1   albuterol (VENTOLIN HFA) 108 (90 Base) MCG/ACT inhaler INHALE TWO puffs into THE lungs EVERY SIX HOURS AS NEEDED wheezind AND For SHORTNESS OF BREATH 6.7 g 3   B-D ULTRA-FINE 33 LANCETS MISC Use as instructed to test sugars once  daily 100 each 12   budesonide-formoterol (SYMBICORT) 160-4.5 MCG/ACT inhaler Inhale 2 puffs into the lungs TWICE DAILY (Patient taking differently: 2 puffs 2 (two) times daily.) 10.2 g 5   carvedilol (COREG) 12.5 MG tablet Take 1 tablet (12.5 mg total) by mouth 2 (two) times daily with a meal. 180 tablet 1   Cholecalciferol (VITAMIN D-3) 125 MCG (5000 UT) TABS Take 5,000 Units by mouth daily.     Cyanocobalamin (VITAMIN B-12) 1000 MCG SUBL Place 1 tablet (1,000 mcg total) under the tongue daily. (Patient taking differently: Place 1,000 mcg under the tongue daily.) 100 tablet 3   dexlansoprazole (DEXILANT) 60 MG capsule TAKE ONE CAPSULE BY MOUTH EVERY DAY Annual appt due in July must see provider for future refills 90 capsule 0   gabapentin (NEURONTIN) 300 MG capsule TAKE ONE  CAPSULE BY MOUTH EVERY MORNING **May take a senod capsule during THE DAY AS NEEDED FOR PAIN 180 capsule 0   glucose blood (ACCU-CHEK GUIDE) test strip Use to check blood sugar once a day. 100 each 12   HUMIRA PEN 40 MG/0.4ML PNKT Inject 40 mg as directed every Wednesday.      hydrOXYzine (ATARAX) 25 MG tablet Take 1 tablet (25 mg total) by mouth every 8 (eight) hours as needed. (Patient taking differently: Take 6.25-12.5 mg by mouth See admin instructions. 6.25 mg in the morning, 12.5 mg at bedtime) 60 tablet 3   metFORMIN (GLUCOPHAGE-XR) 500 MG 24 hr tablet Take 2 tablets (1,000 mg total) by mouth at bedtime. Annual appt due in July must see provider for future refills 180 tablet 0   methocarbamol (ROBAXIN) 500 MG tablet Take 1 tablet (500 mg total) by mouth every 6 (six) hours as needed for muscle spasms. 30 tablet 0   Multiple Vitamins-Minerals (MULTIVITAMIN WOMEN 50+) TABS Take 1 tablet by mouth daily.     naloxone (NARCAN) nasal spray 4 mg/0.1 mL Place 1 spray into the nose once as needed (overdose).     naloxone (NARCAN) nasal spray 4 mg/0.1 mL 1 actuation in one nostril once. May repeat in 2-3 min 1 each 2   neomycin-polymyxin b-dexamethasone (MAXITROL) 3.5-10000-0.1 SUSP Place 1 drop into both eyes 4 (four) times daily as needed (eye irritation).     oxyCODONE (OXYCONTIN) 10 mg 12 hr tablet Take 1 tablet (10 mg total) by mouth every 12 (twelve) hours. 60 tablet 0   OXYGEN Inhale 2 L into the lungs continuous.     polyvinyl alcohol (LIQUIFILM TEARS) 1.4 % ophthalmic solution Place 1 drop into both eyes as needed for dry eyes. 15 mL 0   potassium chloride (KLOR-CON M) 10 MEQ tablet Take 1 tablet (10 mEq total) by mouth daily. Follow-up appt is due must see Garrison for future refills 90 tablet 0   QUEtiapine (SEROQUEL) 50 MG tablet Take 1 tab in am and 2 tab at HS 90 tablet 5   rivaroxaban (XARELTO) 20 MG TABS tablet TAKE ONE TABLET BY MOUTH AT BEDTIME 90 tablet 0   topiramate (TOPAMAX) 50 MG tablet  Take 1 tablet (50 mg total) by mouth 2 (two) times daily. 180 tablet 1   venlafaxine XR (EFFEXOR-XR) 75 MG 24 hr capsule Take 1 capsule (75  mg total) by mouth daily with breakfast. 30 capsule 11   No current facility-administered medications for this visit.     Review of Systems  Please see the history of present illness.    (+)*** (+)***  All other systems reviewed and are otherwise negative except as noted above.  Physical Exam    Wt Readings from Last 3 Encounters:  08/28/21 258 lb (117 kg)  07/31/21 295 lb 10.2 oz (134.1 kg)  07/02/21 269 lb (122 kg)   KG:MWNUU were no vitals filed for this visit.,There is no height or weight on file to calculate BMI.  Constitutional:      Appearance: Healthy appearance. Not in distress.  Neck:     Vascular: JVD normal.  Pulmonary:     Effort: Pulmonary effort is normal.     Breath sounds: No wheezing. No rales. Diminished in the bases Cardiovascular:     Normal rate. Regular rhythm. Normal S1. Normal S2.      Murmurs: There is no murmur.  Edema:    Peripheral edema absent.  Abdominal:     Palpations: Abdomen is soft non tender. There is no hepatomegaly.  Skin:    General: Skin is warm and dry.  Neurological:     General: No focal deficit present.     Mental Status: Alert and oriented to person, place and time.     Cranial Nerves: Cranial nerves are intact.  EKG/LABS/Other Studies Reviewed    ECG personally reviewed by me today - ***  Risk Assessment/Calculations:   {Does this patient have ATRIAL FIBRILLATION?:[phone]}        Lab Results  Component Value Date   WBC 7.2 07/31/2021   HGB 12.3 07/31/2021   HCT 38.6 07/31/2021   MCV 99.5 07/31/2021   PLT 198 07/31/2021   Lab Results  Component Value Date   CREATININE 0.88 07/31/2021   BUN 22 07/31/2021   NA 144 07/31/2021   K 3.9 07/31/2021   CL 107 07/31/2021   CO2 22 07/31/2021   Lab Results  Component Value Date   ALT 15 07/02/2021   AST 18 07/02/2021    ALKPHOS 69 07/02/2021   BILITOT 0.4 07/02/2021   Lab Results  Component Value Date   CHOL 210 (H) 07/19/2020   HDL 77.20 07/19/2020   LDLCALC 111 (H) 07/19/2020   LDLDIRECT 107.0 06/30/2017   TRIG 109.0 07/19/2020   CHOLHDL 3 07/19/2020    Lab Results  Component Value Date   HGBA1C 5.1 03/21/2021    Assessment & Plan    1.  Nonobstructive CAD  2.  Hyperlipidemia  3.  Pulmonary sarcoidosis  4.  History of PE/DVT      Disposition: Follow-up with [name] or APP in *** months {Are you ordering a CV Procedure (e.g. stress test, cath, DCCV, TEE, etc)?   Press F2        :[id number]}   Medication Adjustments/Labs and Tests Ordered: Current medicines are reviewed at length with the patient today.  Concerns regarding medicines are outlined above.   Signed, Mable Fill, Marissa Nestle, NP 08/29/2021, 2:39 PM [place]

## 2021-08-29 NOTE — Telephone Encounter (Signed)
[name] is requesting verbal orders to extend home health physical therapy. Once a week for four weeks.  [name] [phone] Ok to leave a message.

## 2021-08-30 ENCOUNTER — Other Ambulatory Visit: Payer: Self-pay | Admitting: Internal Medicine

## 2021-08-30 NOTE — Telephone Encounter (Signed)
Okay.  Thanks.

## 2021-08-30 NOTE — Telephone Encounter (Signed)
Called Claiborne Billings no answer [facility] w/ MD verbal../lmb

## 2021-08-31 ENCOUNTER — Other Ambulatory Visit: Payer: Self-pay | Admitting: Internal Medicine

## 2021-08-31 MED ORDER — NALTREXONE 50 MG TABLET
ORAL_TABLET | 11 refills | 0 days | Status: CP
Start: 2021-08-31 — End: ?

## 2021-09-03 DIAGNOSIS — E1122 Type 2 diabetes mellitus with diabetic chronic kidney disease: Secondary | ICD-10-CM | POA: Diagnosis not present

## 2021-09-05 ENCOUNTER — Ambulatory Visit: Payer: Self-pay

## 2021-09-05 DIAGNOSIS — M797 Fibromyalgia: Secondary | ICD-10-CM | POA: Diagnosis not present

## 2021-09-05 DIAGNOSIS — Z7984 Long term (current) use of oral hypoglycemic drugs: Secondary | ICD-10-CM | POA: Diagnosis not present

## 2021-09-05 DIAGNOSIS — Z7951 Long term (current) use of inhaled steroids: Secondary | ICD-10-CM | POA: Diagnosis not present

## 2021-09-05 DIAGNOSIS — F32A Depression, unspecified: Secondary | ICD-10-CM | POA: Diagnosis not present

## 2021-09-05 DIAGNOSIS — G4733 Obstructive sleep apnea (adult) (pediatric): Secondary | ICD-10-CM | POA: Diagnosis not present

## 2021-09-05 DIAGNOSIS — Z86718 Personal history of other venous thrombosis and embolism: Secondary | ICD-10-CM | POA: Diagnosis not present

## 2021-09-05 DIAGNOSIS — E119 Type 2 diabetes mellitus without complications: Secondary | ICD-10-CM | POA: Diagnosis not present

## 2021-09-05 DIAGNOSIS — G4700 Insomnia, unspecified: Secondary | ICD-10-CM | POA: Diagnosis not present

## 2021-09-05 DIAGNOSIS — M069 Rheumatoid arthritis, unspecified: Secondary | ICD-10-CM | POA: Diagnosis not present

## 2021-09-05 DIAGNOSIS — K219 Gastro-esophageal reflux disease without esophagitis: Secondary | ICD-10-CM | POA: Diagnosis not present

## 2021-09-05 DIAGNOSIS — M1990 Unspecified osteoarthritis, unspecified site: Secondary | ICD-10-CM | POA: Diagnosis not present

## 2021-09-05 DIAGNOSIS — Z79620 Long term (current) use of immunosuppressive biologic: Secondary | ICD-10-CM | POA: Diagnosis not present

## 2021-09-05 DIAGNOSIS — W19XXXD Unspecified fall, subsequent encounter: Secondary | ICD-10-CM | POA: Diagnosis not present

## 2021-09-05 DIAGNOSIS — I10 Essential (primary) hypertension: Secondary | ICD-10-CM

## 2021-09-05 DIAGNOSIS — S72432D Displaced fracture of medial condyle of left femur, subsequent encounter for closed fracture with routine healing: Secondary | ICD-10-CM | POA: Diagnosis not present

## 2021-09-05 DIAGNOSIS — Z87891 Personal history of nicotine dependence: Secondary | ICD-10-CM | POA: Diagnosis not present

## 2021-09-05 DIAGNOSIS — E559 Vitamin D deficiency, unspecified: Secondary | ICD-10-CM | POA: Diagnosis not present

## 2021-09-05 DIAGNOSIS — Z8673 Personal history of transient ischemic attack (TIA), and cerebral infarction without residual deficits: Secondary | ICD-10-CM | POA: Diagnosis not present

## 2021-09-05 DIAGNOSIS — D869 Sarcoidosis, unspecified: Secondary | ICD-10-CM | POA: Diagnosis not present

## 2021-09-05 DIAGNOSIS — Z7901 Long term (current) use of anticoagulants: Secondary | ICD-10-CM | POA: Diagnosis not present

## 2021-09-05 DIAGNOSIS — Z9181 History of falling: Secondary | ICD-10-CM | POA: Diagnosis not present

## 2021-09-05 DIAGNOSIS — E538 Deficiency of other specified B group vitamins: Secondary | ICD-10-CM | POA: Diagnosis not present

## 2021-09-05 DIAGNOSIS — Z9981 Dependence on supplemental oxygen: Secondary | ICD-10-CM | POA: Diagnosis not present

## 2021-09-05 NOTE — Patient Instructions (Signed)
Visit Information  Thank you for taking time to visit with me today. Please don't hesitate to contact me if I can be of assistance to you.   Following are the goals we discussed today:   Goals Addressed             This Visit's Progress    Care Coordinaton: Continue to improve post SNF rehab stay       Care Coordination Interventions: Advised patient to continue physical therapy recommended exercises and remain active Reviewed medications with patient and discussed importance of taking as prescribed Reviewed upcoming appointments and encouraged to attend as scheduled Confirmed patient has spoken with LCSW Referral to care guide for housing resources. Update to LCSW provided        Our next appointment is by telephone on 10/08/21 at 1:30pm  Please call the care guide team at [phone] if you need to cancel or reschedule your appointment.   If you are experiencing a Mental Health or [place] or need someone to talk to, please call the Suicide and Crisis Lifeline: 988  Patient verbalizes understanding of instructions and care plan provided today and agrees to view in [place]. Active MyChart status and patient understanding of how to access instructions and care plan via MyChart confirmed with patient.     [name], RN, MSN, BSN, CCM Care Coordinator [phone]

## 2021-09-05 NOTE — Patient Outreach (Signed)
  Care Coordination   Follow Up Visit Note   09/05/2021 Name: Galaxy Borden MRN: [id number] DOB: 08-24-1960  Malynda Smolinski is a 61 y.o. year old female who sees Plotnikov, Evie Lacks, MD for primary care. I spoke with  [name] by phone today  What matters to the patients health and wellness today?  Reports everything is better. She reports seeing primary care provider and states was prescribed pain medication that has improved pain level. She states "8" at this time, but reports has done a lot of walking today and has done exercises recommended by home health physical therapist.  She reports she is interested in finding another place to live.     Goals Addressed             This Visit's Progress    Care Coordinaton: Continue to improve post SNF rehab stay       Care Coordination Interventions: Advised patient to continue physical therapy recommended exercises and remain active Reviewed medications with patient and discussed importance of taking as prescribed Reviewed upcoming appointments and encouraged to attend as scheduled Confirmed patient has spoken with LCSW Referral to care guide for housing resources. Update to LCSW provided        SDOH assessments and interventions completed:  No    Care Coordination Interventions Activated:  Yes  Care Coordination Interventions:  Yes, provided   Follow up plan: Follow up call scheduled for 10/08/21    Encounter Outcome:  Pt. Visit Completed   [name], RN, MSN, BSN, [place] Coordinator [phone]

## 2021-09-06 DIAGNOSIS — J449 Chronic obstructive pulmonary disease, unspecified: Secondary | ICD-10-CM | POA: Diagnosis not present

## 2021-09-06 NOTE — Unmapped (Signed)
[facility] Refill Coordination Note    Specialty Medication(s) to be Shipped:   Inflammatory Disorders: Humira    Other medication(s) to be shipped: No additional medications requested for fill at this time     Carolyn Newton, DOB: 05-Jan-1961  Phone: [phone] (home)       All above HIPAA information was verified with patient.     Was a Nurse, learning disability used for this call? No    Completed refill call assessment today to schedule patient's medication shipment from the [facility] [phone]).  All relevant notes have been reviewed.     Specialty medication(s) and dose(s) confirmed: Regimen is correct and unchanged.   Changes to medications: L[place]ika reports no changes at this time.  Changes to insurance: No  New side effects reported not previously addressed with a pharmacist or physician: None reported  Questions for the pharmacist: No    Confirmed patient received a Conservation officer, historic buildings and a Surveyor, mining with first shipment. The patient will receive a drug information handout for each medication shipped and additional FDA Medication Guides as required.       DISEASE/MEDICATION-SPECIFIC INFORMATION        For patients on injectable medications: Patient currently has 2 doses left.  Next injection is scheduled for 09/12/21.    SPECIALTY MEDICATION ADHERENCE     Medication Adherence    Patient reported X missed doses in the last month: 0  Specialty Medication: Humira  Patient is on additional specialty medications: No                                Were doses missed due to medication being on hold? No        REFERRAL TO PHARMACIST     Referral to the pharmacist: Not needed      [facility]     Shipping address confirmed in Epic.     Delivery Scheduled: Yes, Expected medication delivery date: 09/14/21.     Medication will be delivered via UPS to the prescription address in Epic WAM.    Quintella Reichert   [facility] Specialty Technician

## 2021-09-07 ENCOUNTER — Ambulatory Visit: Payer: Medicare Other | Admitting: Nurse Practitioner

## 2021-09-10 ENCOUNTER — Ambulatory Visit: Payer: Self-pay | Admitting: Licensed Clinical Social Worker

## 2021-09-10 ENCOUNTER — Telehealth: Payer: Self-pay

## 2021-09-10 NOTE — Patient Outreach (Signed)
  Care Coordination  Follow Up Visit Note   09/10/2021 Name: [name] MRN: [id number] DOB: 1960/11/20  [name] is a 61 y.o. year old female who sees Plotnikov, Evie Lacks, MD for primary care. I spoke with  [name] by phone today.  What matters to the patients health and wellness today?  Starting Therapy    Goals Addressed             This Visit's Progress    manage emotions       Care Coordination Interventions: Solution-Focused Strategies employed:  Active listening / Reflection utilized  Collaborate with [facility] [phone]  Has initial appointment Sept. 11th  Assessed for additional needs          SDOH assessments and interventions completed:  No    Care Coordination Interventions Activated:  Yes  Care Coordination Interventions:  Yes, provided   Follow up plan: Follow up call scheduled for Sept. 12th     Encounter Outcome:  Pt. Visit Completed   Casimer Lanius, [place] [phone]

## 2021-09-10 NOTE — Telephone Encounter (Signed)
   Telephone encounter was:  Successful.  09/10/2021 Name: [name] MRN: [id number] DOB: Jul 30, 1960  [name] is a 61 y.o. year old female who is a primary care patient of Plotnikov, Evie Lacks, MD . The community resource team was consulted for assistance with  housing.  Care guide performed the following interventions: Spoke with patient confirmed email address. Sent information for [employer] and [employer]. Patient has my name and number to call if she does not receive email.  Follow Up Plan:  No further follow up planned at this time. The patient has been provided with needed resources.  Norm Wray, AAS Paralegal, [employer]  [address], [place] [address] [email or web address]  ?? [phone]   [email or web address]

## 2021-09-10 NOTE — Telephone Encounter (Signed)
Kenney Houseman is asking for a cb from the [place] as the [id number] form was not filled out completely.   She wants to explain what she is requesting. It was 8/9 when it was faxed back to her.  Please Call Tonya back [phone]

## 2021-09-10 NOTE — Patient Instructions (Signed)
Visit Information  Thank you for taking time to visit with me today. Please don't hesitate to contact me if I can be of assistance to you.   Following are the goals we discussed today:   Goals Addressed             This Visit's Progress    manage emotions       Care Coordination Interventions: Solution-Focused Strategies employed:  Active listening / Reflection utilized  Collaborate with [facility] [phone]  Has initial appointment Sept. 11th  Assessed for additional needs           Our next appointment is by telephone on Sept 12th at 2"00  Please call the care guide team at [phone] if you need to cancel or reschedule your appointment.   If you are experiencing a Mental Health or [place] or need someone to talk to, please call the Suicide and Crisis Lifeline: 988 call the [place] National Suicide Prevention Lifeline: 431-738-0056 or TTY: 320-105-3786 TTY 704 473 7665) to talk to a trained counselor call 1-800-273-TALK (toll free, 24 hour hotline) go to [facility] [address] [place] [phone])   Patient verbalizes understanding of instructions and care plan provided today and agrees to view in [place]. Active MyChart status and patient understanding of how to access instructions and care plan via MyChart confirmed with patient.     Casimer Lanius, [place] [phone]

## 2021-09-11 NOTE — Telephone Encounter (Signed)
Okay.  Thanks.

## 2021-09-11 NOTE — Telephone Encounter (Signed)
Completed the section that was not finished gave to MD to initial../lmb

## 2021-09-11 NOTE — Telephone Encounter (Signed)
Kenney Houseman is re-faxing paperwork for this patient - I will bring it back to you once we receive it - Kenney Houseman would like for you to call her back before paperwork is completed as she says it was done completed correctly the first time. Call back number is [phone]

## 2021-09-11 NOTE — Telephone Encounter (Signed)
Paperwork received.

## 2021-09-12 NOTE — Telephone Encounter (Signed)
Forms faxed back to [phone],,,,/lmb

## 2021-09-13 ENCOUNTER — Telehealth: Payer: Self-pay

## 2021-09-13 DIAGNOSIS — F32A Depression, unspecified: Secondary | ICD-10-CM | POA: Diagnosis not present

## 2021-09-13 DIAGNOSIS — D869 Sarcoidosis, unspecified: Secondary | ICD-10-CM | POA: Diagnosis not present

## 2021-09-13 DIAGNOSIS — Z7984 Long term (current) use of oral hypoglycemic drugs: Secondary | ICD-10-CM | POA: Diagnosis not present

## 2021-09-13 DIAGNOSIS — M1990 Unspecified osteoarthritis, unspecified site: Secondary | ICD-10-CM | POA: Diagnosis not present

## 2021-09-13 DIAGNOSIS — K219 Gastro-esophageal reflux disease without esophagitis: Secondary | ICD-10-CM | POA: Diagnosis not present

## 2021-09-13 DIAGNOSIS — M069 Rheumatoid arthritis, unspecified: Secondary | ICD-10-CM | POA: Diagnosis not present

## 2021-09-13 DIAGNOSIS — G4733 Obstructive sleep apnea (adult) (pediatric): Secondary | ICD-10-CM | POA: Diagnosis not present

## 2021-09-13 DIAGNOSIS — Z9981 Dependence on supplemental oxygen: Secondary | ICD-10-CM | POA: Diagnosis not present

## 2021-09-13 DIAGNOSIS — Z86718 Personal history of other venous thrombosis and embolism: Secondary | ICD-10-CM | POA: Diagnosis not present

## 2021-09-13 DIAGNOSIS — Z79620 Long term (current) use of immunosuppressive biologic: Secondary | ICD-10-CM | POA: Diagnosis not present

## 2021-09-13 DIAGNOSIS — M797 Fibromyalgia: Secondary | ICD-10-CM | POA: Diagnosis not present

## 2021-09-13 DIAGNOSIS — E538 Deficiency of other specified B group vitamins: Secondary | ICD-10-CM | POA: Diagnosis not present

## 2021-09-13 DIAGNOSIS — Z8673 Personal history of transient ischemic attack (TIA), and cerebral infarction without residual deficits: Secondary | ICD-10-CM | POA: Diagnosis not present

## 2021-09-13 DIAGNOSIS — I10 Essential (primary) hypertension: Secondary | ICD-10-CM | POA: Diagnosis not present

## 2021-09-13 DIAGNOSIS — Z7951 Long term (current) use of inhaled steroids: Secondary | ICD-10-CM | POA: Diagnosis not present

## 2021-09-13 DIAGNOSIS — W19XXXD Unspecified fall, subsequent encounter: Secondary | ICD-10-CM | POA: Diagnosis not present

## 2021-09-13 DIAGNOSIS — G4700 Insomnia, unspecified: Secondary | ICD-10-CM | POA: Diagnosis not present

## 2021-09-13 DIAGNOSIS — Z7901 Long term (current) use of anticoagulants: Secondary | ICD-10-CM | POA: Diagnosis not present

## 2021-09-13 DIAGNOSIS — Z9181 History of falling: Secondary | ICD-10-CM | POA: Diagnosis not present

## 2021-09-13 DIAGNOSIS — S72432D Displaced fracture of medial condyle of left femur, subsequent encounter for closed fracture with routine healing: Secondary | ICD-10-CM | POA: Diagnosis not present

## 2021-09-13 DIAGNOSIS — E119 Type 2 diabetes mellitus without complications: Secondary | ICD-10-CM | POA: Diagnosis not present

## 2021-09-13 DIAGNOSIS — E559 Vitamin D deficiency, unspecified: Secondary | ICD-10-CM | POA: Diagnosis not present

## 2021-09-13 DIAGNOSIS — Z87891 Personal history of nicotine dependence: Secondary | ICD-10-CM | POA: Diagnosis not present

## 2021-09-13 MED FILL — HUMIRA PEN CITRATE FREE 40 MG/0.4 ML: SUBCUTANEOUS | 28 days supply | Qty: 4 | Fill #3

## 2021-09-13 NOTE — Telephone Encounter (Signed)
Beth with [facility] home [facility] called stating that patient is having increased pain in her left leg and it's still warm to the touch.  Stated that she did advise patient to use ice.  Wanted to know if patient needed to be seen before her appt.on 09/19/2021.  CB# [phone].  Please advise.  Thank you.

## 2021-09-13 NOTE — Telephone Encounter (Signed)
FYI I called and talked with patient-stated that her incision is red and her leg feels warm to touch.  I offered to work her in tomorrow morning but patient refused stating she could not make it because she will be waiting for her Humira to be delivered to her home.  She did say that she would send a photo for one of you to review

## 2021-09-14 NOTE — Telephone Encounter (Signed)
thx

## 2021-09-17 NOTE — Telephone Encounter (Signed)
[name], did she ever send a picture to [place]?  Looks like her next appointment is on Wednesday so either way I guess we will end up seeing how the incision looks on Wednesday

## 2021-09-18 NOTE — Telephone Encounter (Signed)
Holding for Lauren. ?

## 2021-09-18 NOTE — Progress Notes (Deleted)
Office Visit    Patient Name: [name] Date of Encounter: 09/18/2021  Primary Care Provider:  Cassandria Anger, MD Primary Cardiologist:  Jenkins Rouge, MD Primary Electrophysiologist: None  Chief Complaint    [name] is a 61 y.o. 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  Past Medical History    Past Medical History:  Diagnosis Date   ALLERGIC RHINITIS 10/26/2009   no current problems   Anxiety    B12 DEFICIENCY 08/25/2007   takes B12 supplement   Complication of anesthesia    pt has had difficulty following anesthesia with her knee in 2016-unable to care for herself afterward   Depression    takes effexor xr daily   Diabetes mellitus without complication ([place])    was on insulin but has been off since Nov 2015 and now only takes Metformin daily   DYSPNEA 04/28/2009   uses oxygen 24/7, on 2L via [place]   Esophageal reflux    takes Nexium daily   Fibromyalgia    Headache    last migraine 2-3yr ago;takes Topamax daily   History of shingles    Hypertension    takes Coreg daily   Insomnia    takes Nortriptyline nightly    Long-term memory loss    OSA (obstructive sleep apnea)    doesn't use CPAP;sleep study in epic from 2006   Osteoarthritis    Panic attacks    PONV (postoperative nausea and vomiting)    on time in 2016 knee surgery   [place]umatoid [place]    Sarcoidosis    Dr. ZLolita Patella  Stroke ([facility]    Vitamin D deficiency    is supposed to take Vit D but can't afford it   Past Surgical History:  Procedure Laterality Date   APPENDECTOMY     arthroscopic knee surgery Right 11-12-04   AXILLARY ABCESS IRRIGATION AND DEBRIDEMENT  Jul & Aug2012   BIOPSY  11/02/2019   Procedure: BIOPSY;  Surgeon: PJerene Bears MD;  [facility];  Service: Gastroenterology;;    CARPAL TUNNEL RELEASE Left 05/23/2014   Procedure: CARPAL TUNNEL RELEASE;  Surgeon: SMeredith Pel MD;  [place]  Service: Orthopedics;  Laterality: Left;   CHOLECYSTECTOMY N/A 02/11/2018   Procedure: LAPAROSCOPIC CHOLECYSTECTOMY WITH INTRAOPERATIVE CHOLANGIOGRAM ERAS PATHWAY;  Surgeon: TDonnie Mesa MD;  [place]  Service: General;  Laterality: N/A;   COLONOSCOPY WITH PROPOFOL N/A 11/02/2019   Procedure: COLONOSCOPY WITH PROPOFOL;  Surgeon: PJerene Bears MD;  [facility];  Service: Gastroenterology;  Laterality: N/A;   cyst removed from top of buttocks  at age 61  DACRORHINOCYSTOTOMY Bilateral 03/23/2021   Procedure: ENDOSCOPIC DACROCYSTORHINOSTOMY;  Surgeon: LDelia Chimes MD;  [place] [facility]  Service: Ophthalmology;  Laterality: Bilateral;   ENDOMETRIAL ABLATION     IR CHOLANGIOGRAM EXISTING TUBE  11/21/2017   IR CHOLANGIOGRAM EXISTING TUBE  01/16/2018   IR EXCHANGE BILIARY DRAIN  11/10/2017   IR PATIENT EVAL TECH 0-60 MINS  02/03/2018   IR PERC CHOLECYSTOSTOMY  09/13/2017   IR RADIOLOGIST EVAL & MGMT  10/08/2017   LACRIMAL DUCT EXPLORATION Right 06/26/2017   Procedure: LACRIMAL DUCT EXPLORATION AND ETHMOIDECTOMY;  Surgeon: AClista Bernhardt MD;  [facility] [facility]  Service: Ophthalmology;  Laterality: Right;   LACRIMAL DUCT EXPLORATION Bilateral 03/23/2021   Procedure: NASAL  LACRIMAL DUCT EXPLORATION;  Surgeon: Delia Chimes, MD;  Location: [place];  Service: Ophthalmology;  Laterality: Bilateral;   LACRIMAL TUBE INSERTION Bilateral 03/23/2021   Procedure: INSERTION OF CRAWFORD LACRIMAL STENT;  Surgeon: Delia Chimes, MD;  Location: [place];  Service: Ophthalmology;  Laterality: Bilateral;   ORIF FEMUR FRACTURE Left 07/31/2021   Procedure: left medial femoral condyle fracture fixation;  Surgeon: Meredith Pel, MD;  Location: [place];  Service: Orthopedics;  Laterality: Left;   POLYPECTOMY  11/02/2019   Procedure: POLYPECTOMY;  Surgeon: Jerene Bears, MD;   [facility];  Service: Gastroenterology;;   TEAR DUCT PROBING Right 06/26/2017   Procedure: TEAR DUCT PROBING WITH STENT;  Surgeon: Clista Bernhardt, MD;  Location: [place];  Service: Ophthalmology;  Laterality: Right;   TOTAL KNEE ARTHROPLASTY Right 11/15/2014   Procedure: TOTAL RIGHT KNEE ARTHROPLASTY;  Surgeon: Meredith Pel, MD;  Location: [place];  Service: Orthopedics;  Laterality: Right;   TOTAL KNEE ARTHROPLASTY Left 07/13/2015   Procedure: LEFT TOTAL KNEE ARTHROPLASTY;  Surgeon: Meredith Pel, MD;  Location: [place];  Service: Orthopedics;  Laterality: Left;    Allergies  Allergies  Allergen Reactions   Belsomra [Suvorexant] Other (See Comments)    [place] out of bed while asleep: "I'm waking up as I'm falling on the floor;" "Night terrors"   Enalapril Maleate Cough   Latex Hives   Nickel Other (See Comments)    Blisters  Pt has a titanium right and left knee - nickel causes skin irritations that form into blisters and sores   Other Other (See Comments)    Patient has Sarcoidosis and can't tolerate any metals   Morphine And Related Itching and Nausea Only   Doxycycline Diarrhea and Nausea And Vomiting   Hydrochlorothiazide Other (See Comments)    Low potassium levels    Hydroxychloroquine Sulfate Other (See Comments)    Vision changes    Lyrica [Pregabalin] Other (See Comments)    "Made me feel high"   Tape Other (See Comments)    Medical tape causes bruising    History of Present Illness    [name] is a 61 year old female with the above-mentioned past medical history who presents today for follow-up of hypertension and nonobstructive CAD.  She is on lifelong anticoagulation for history of PE/DVT.  She was initially seen in 2014 for complaint of atypical chest pain.  She underwent Lexiscan Myoview that was normal.  She had TTE completed 11/2016 with EF 60-65% no significant valve disease or signs of pulmonary HTN.     She was seen in  follow-up by Dr. Johnsie Cancel in 2020 and reported no complaints of chest pain at that time.  She had received preoperative clearance for gallbladder surgery during visit.  She was last seen by Truitt Merle on 08/2019 for annual follow-up.  She was doing well from the cardiovascular perspective but was having concerns with GI issues and frequent diarrhea.  She is on continuous oxygen due to sarcoidosis and is being followed by pulmonary.       Since last being seen in the office patient reports***.  Patient denies chest pain, palpitations, dyspnea, PND, orthopnea, nausea, vomiting, dizziness, syncope, edema, weight gain, or early satiety.   ***Notes:  Home Medications    Current Outpatient Medications  Medication Sig Dispense Refill   acetaminophen (TYLENOL) 325 MG tablet Take 1-2 tablets (325-650 mg total) by mouth every 6 (six) hours as needed for mild pain (pain score 1-3 or  temp > 100.5). 30 tablet 1   albuterol (VENTOLIN HFA) 108 (90 Base) MCG/ACT inhaler INHALE TWO puffs into THE lungs EVERY SIX HOURS AS NEEDED wheezind AND For SHORTNESS OF BREATH 6.7 g 3   B-D ULTRA-FINE 33 LANCETS MISC Use as instructed to test sugars once  daily 100 each 12   budesonide-formoterol (SYMBICORT) 160-4.5 MCG/ACT inhaler Inhale 2 puffs into the lungs TWICE DAILY (Patient taking differently: 2 puffs 2 (two) times daily.) 10.2 g 5   carvedilol (COREG) 12.5 MG tablet Take 1 tablet (12.5 mg total) by mouth 2 (two) times daily with a meal. 180 tablet 1   Cholecalciferol (VITAMIN D-3) 125 MCG (5000 UT) TABS Take 5,000 Units by mouth daily.     Cyanocobalamin (VITAMIN B-12) 1000 MCG SUBL Place 1 tablet (1,000 mcg total) under the tongue daily. (Patient taking differently: Place 1,000 mcg under the tongue daily.) 100 tablet 3   dexlansoprazole (DEXILANT) 60 MG capsule TAKE ONE CAPSULE BY MOUTH EVERY DAY 90 capsule 1   gabapentin (NEURONTIN) 300 MG capsule TAKE ONE CAPSULE BY MOUTH EVERY MORNING **May take a senod capsule  during THE DAY AS NEEDED FOR PAIN 180 capsule 0   glucose blood (ACCU-CHEK GUIDE) test strip Use to check blood sugar once a day. 100 each 12   HUMIRA PEN 40 MG/0.4ML PNKT Inject 40 mg as directed every Wednesday.      hydrOXYzine (ATARAX) 25 MG tablet Take 1 tablet (25 mg total) by mouth every 8 (eight) hours as needed. (Patient taking differently: Take 6.25-12.5 mg by mouth See admin instructions. 6.25 mg in the morning, 12.5 mg at bedtime) 60 tablet 3   metFORMIN (GLUCOPHAGE-XR) 500 MG 24 hr tablet Take 2 tablets (1,000 mg total) by mouth at bedtime. Annual appt due in July must see provider for future refills 180 tablet 0   methocarbamol (ROBAXIN) 500 MG tablet Take 1 tablet (500 mg total) by mouth every 6 (six) hours as needed for muscle spasms. 30 tablet 0   Multiple Vitamins-Minerals (MULTIVITAMIN WOMEN 50+) TABS Take 1 tablet by mouth daily.     naloxone (NARCAN) nasal spray 4 mg/0.1 mL Place 1 spray into the nose once as needed (overdose).     naloxone (NARCAN) nasal spray 4 mg/0.1 mL 1 actuation in one nostril once. May repeat in 2-3 min 1 each 2   neomycin-polymyxin b-dexamethasone (MAXITROL) 3.5-10000-0.1 SUSP Place 1 drop into both eyes 4 (four) times daily as needed (eye irritation).     oxyCODONE (OXYCONTIN) 10 mg 12 hr tablet Take 1 tablet (10 mg total) by mouth every 12 (twelve) hours. 60 tablet 0   OXYGEN Inhale 2 L into the lungs continuous.     polyvinyl alcohol (LIQUIFILM TEARS) 1.4 % ophthalmic solution Place 1 drop into both eyes as needed for dry eyes. 15 mL 0   potassium chloride (KLOR-CON M) 10 MEQ tablet Take 1 tablet (10 mEq total) by mouth daily. Follow-up appt is due must see MD for future refills 90 tablet 0   QUEtiapine (SEROQUEL) 50 MG tablet Take 1 tab in am and 2 tab at HS 90 tablet 5   rivaroxaban (XARELTO) 20 MG TABS tablet TAKE ONE TABLET BY MOUTH AT BEDTIME 90 tablet 0   topiramate (TOPAMAX) 50 MG tablet Take 1 tablet (50 mg total) by mouth 2 (two) times daily.  180 tablet 1   venlafaxine XR (EFFEXOR-XR) 75 MG 24 hr capsule Take 1 capsule (75 mg total) by mouth daily with breakfast. 30  capsule 11   No current facility-administered medications for this visit.     Review of Systems  Please see the history of present illness.    (+)*** (+)***  All other systems reviewed and are otherwise negative except as noted above.  Physical Exam    Wt Readings from Last 3 Encounters:  08/28/21 258 lb (117 kg)  07/31/21 295 lb 10.2 oz (134.1 kg)  07/02/21 269 lb (122 kg)   IR:WERXV were no vitals filed for this visit.,There is no height or weight on file to calculate BMI.  Constitutional:      Appearance: Healthy appearance. Not in distress.  Neck:     Vascular: JVD normal.  Pulmonary:     Effort: Pulmonary effort is normal.     Breath sounds: No wheezing. No rales. Diminished in the bases Cardiovascular:     Normal rate. Regular rhythm. Normal S1. Normal S2.      Murmurs: There is no murmur.  Edema:    Peripheral edema absent.  Abdominal:     Palpations: Abdomen is soft non tender. There is no hepatomegaly.  Skin:    General: Skin is warm and dry.  Neurological:     General: No focal deficit present.     Mental Status: Alert and oriented to person, place and time.     Cranial Nerves: Cranial nerves are intact.  EKG/LABS/Other Studies Reviewed    ECG personally reviewed by me today - ***  Risk Assessment/Calculations:   {Does this patient have ATRIAL FIBRILLATION?:[phone]}        Lab Results  Component Value Date   WBC 7.2 07/31/2021   HGB 12.3 07/31/2021   HCT 38.6 07/31/2021   MCV 99.5 07/31/2021   PLT 198 07/31/2021   Lab Results  Component Value Date   CREATININE 0.88 07/31/2021   BUN 22 07/31/2021   NA 144 07/31/2021   K 3.9 07/31/2021   CL 107 07/31/2021   CO2 22 07/31/2021   Lab Results  Component Value Date   ALT 15 07/02/2021   AST 18 07/02/2021   ALKPHOS 69 07/02/2021   BILITOT 0.4 07/02/2021   Lab  Results  Component Value Date   CHOL 210 (H) 07/19/2020   HDL 77.20 07/19/2020   LDLCALC 111 (H) 07/19/2020   LDLDIRECT 107.0 06/30/2017   TRIG 109.0 07/19/2020   CHOLHDL 3 07/19/2020    Lab Results  Component Value Date   HGBA1C 5.1 03/21/2021    Assessment & Plan   1.  Nonobstructive CAD   2.  Hyperlipidemia   3.  Pulmonary sarcoidosis   4.  History of PE/DVT    Disposition: Follow-up with Jenkins Rouge, MD or APP in *** months {Are you ordering a CV Procedure (e.g. stress test, cath, DCCV, TEE, etc)?   Press F2        :[id number]}   Medication Adjustments/Labs and Tests Ordered: Current medicines are reviewed at length with the patient today.  Concerns regarding medicines are outlined above.   Signed, Mable Fill, Marissa Nestle, NP 09/18/2021, 3:00 PM [place]

## 2021-09-19 ENCOUNTER — Ambulatory Visit (INDEPENDENT_AMBULATORY_CARE_PROVIDER_SITE_OTHER): Payer: Medicare Other

## 2021-09-19 ENCOUNTER — Ambulatory Visit (INDEPENDENT_AMBULATORY_CARE_PROVIDER_SITE_OTHER): Payer: Medicare Other | Admitting: Orthopedic Surgery

## 2021-09-19 ENCOUNTER — Encounter: Payer: Self-pay | Admitting: Orthopedic Surgery

## 2021-09-19 DIAGNOSIS — M25562 Pain in left knee: Secondary | ICD-10-CM | POA: Diagnosis not present

## 2021-09-19 NOTE — Progress Notes (Signed)
Post-Op Visit Note   Patient: [name]           Date of Birth: 1960/12/03           MRN: [id number] Visit Date: 09/19/2021 PCP: Cassandria Anger, MD   Assessment & Plan:  Chief Complaint:  Chief Complaint  Patient presents with   Left Knee - Follow-up   Visit Diagnoses:  1. Left knee pain, unspecified chronicity     Plan: [name] is a 61 year old patient is now 7 weeks out ORIF left medial femoral condyle fracture.  She is weightbearing as tolerated with her walker.  She having some whole left leg pain.  Has not been back yet to [facility] yet.  No fevers or chills.  On exam she has excellent range of motion of the left knee.  Incision well-healed with no dependent erythema.  No effusion in the knee.  No real tenderness to palpation around the medial aspect of the distal femur.  Radiographs look good.  Plan at this time is to continue weightbearing as tolerated.  Follow-up as needed.  No indication of any infection at this time.  Did encourage her to go to [facility] within the next 4 weeks to have this left leg radiculopathy addressed.  No weakness on that today.  Follow-Up Instructions: No follow-ups on file.   Orders:  Orders Placed This Encounter  Procedures   XR Knee 1-2 Views Left   No orders of the defined types were placed in this encounter.   Imaging: XR Knee 1-2 Views Left  Result Date: 09/19/2021 AP lateral radiographs left knee reviewed.  Calcification is present along the posterior medial condyle.  Plate fixation in good position alignment with no lucencies around the screws.  Total knee prosthesis also unchanged in position alignment   PMFS History: Patient Active Problem List   Diagnosis Date Noted   Gait disorder 08/28/2021   Closed fracture of distal end of femur, unspecified fracture morphology, unspecified laterality, initial encounter ([place]) 07/31/2021   Nasolacrimal duct obstruction, acquired, bilateral  03/23/2021   Chronic pain disorder 03/23/2021   Dacrocystitis, left 11/20/2020   Cellulitis, face 11/20/2020   Well adult exam 07/19/2020   Benign neoplasm of ascending colon    Benign neoplasm of descending colon    Benign neoplasm of sigmoid colon    Chronic diarrhea    Diarrhea 07/06/2019   Shoulder pain, bilateral 02/01/2019   Physical deconditioning 01/28/2019   Chronic anticoagulation 11/19/2018   Insulin dependent diabetes mellitus type IA ([place]) 11/19/2018   Oxygen dependent 11/19/2018   Family history of colon cancer 10/26/2018   Myringotomy tube status 08/21/2018   Vaginal candidiasis 07/15/2018   Nasal crusting 06/12/2018   Chronic serous otitis media of left ear 06/04/2018   Mixed conductive and sensorineural hearing loss of left ear with restricted hearing of right ear 06/04/2018   Chronic cholecystitis with calculus 02/11/2018   Thrush, oral 01/15/2018   Nasal sinus congestion 01/15/2018   Dyslipidemia 10/28/2017   Pruritus 10/23/2017   Abnormal liver function test    Anemia 09/24/2017   Migraines 09/17/2017   Cholecystitis, acute 09/12/2017   COPD (chronic obstructive pulmonary disease) ([place]) 71/69/6789   Diastolic dysfunction 38/10/1749   OSA (obstructive sleep apnea) 09/30/2016   Chronic respiratory failure with hypoxia ([place]) 08/23/2016   Pulmonary embolism ([place]) 07/21/2016   DVT (deep venous thrombosis) ([place]) 07/21/2016   Acute bronchitis 01/30/2016   Weight gain 12/20/2015   Controlled  type 2 diabetes mellitus with chronic kidney disease, without long-term current use of insulin ([place]) 02/01/2015   Abnormal SPEP 06/04/2013   Memory loss 04/27/2013   Panic attacks 03/17/2013   Rash 10/27/2012   Pulmonary nodules 07/20/2012   CAD in native artery 05/28/2012   Seborrheic dermatitis 01/20/2012   Obesity, Class III, BMI 40-49.9 (morbid obesity) ([place]) 03/07/2010   INSOMNIA, CHRONIC 03/07/2010   Allergic rhinitis 10/26/2009   Grief reaction 08/01/2009    Essential hypertension 08/27/2007   B12 deficiency 08/25/2007   Vitamin D deficiency 08/25/2007   Urinary tract infection 02/07/2007   SINUSITIS, ACUTE 02/02/2007   Osteoarthritis 02/02/2007   Sarcoidosis 08/14/2006   Anxiety disorder 08/14/2006   Major depressive disorder, recurrent episode ([place]) 08/14/2006   GERD (gastroesophageal reflux disease) 08/14/2006   Past Medical History:  Diagnosis Date   ALLERGIC RHINITIS 10/26/2009   no current problems   Anxiety    B12 DEFICIENCY 08/25/2007   takes B12 supplement   Complication of anesthesia    pt has had difficulty following anesthesia with her knee in 2016-unable to care for herself afterward   Depression    takes effexor xr daily   Diabetes mellitus without complication ([place])    was on insulin but has been off since Nov 2015 and now only takes Metformin daily   DYSPNEA 04/28/2009   uses oxygen 24/7, on 2L via [place]   Esophageal reflux    takes Nexium daily   Fibromyalgia    Headache    last migraine 2-82yr ago;takes Topamax daily   History of shingles    Hypertension    takes Coreg daily   Insomnia    takes Nortriptyline nightly    Long-term memory loss    OSA (obstructive sleep apnea)    doesn't use CPAP;sleep study in epic from 2006   Osteoarthritis    Panic attacks    PONV (postoperative nausea and vomiting)    on time in 2016 knee surgery   Rheumatoid arthritis ([facility])    Sarcoidosis    Dr. ZLolita Patella  Stroke ([facility]    Vitamin D deficiency    is supposed to take Vit D but can't afford it    Family History  Problem Relation Age of Onset   Heart disease Mother    Kidney disease Mother        renal failure   Cancer Father        leukemia   Hypertension Other    Coronary artery disease Other        female 1st degree relative <60   Heart failure Other        congestive   Coronary artery disease Other        Female 1st degree relative <50   Breast cancer Other        1st degree relative <50 S   Breast cancer  Sister    Colon cancer Brother 549  Stroke Brother    Heart attack Brother    Cancer Brother 558      colon ca   Esophageal cancer Neg Hx    Stomach cancer Neg Hx    Rectal cancer Neg Hx     Past Surgical History:  Procedure Laterality Date   APPENDECTOMY     arthroscopic knee surgery Right 11-12-04   AXILLARY ABCESS IRRIGATION AND DEBRIDEMENT  Jul & Aug2012   BIOPSY  11/02/2019   Procedure: BIOPSY;  Surgeon: PJerene Bears MD;  [facility];  Service: Gastroenterology;;   Wilmon Pali RELEASE Left 05/23/2014   Procedure: CARPAL TUNNEL RELEASE;  Surgeon: Meredith Pel, MD;  Location: [place];  Service: Orthopedics;  Laterality: Left;   CHOLECYSTECTOMY N/A 02/11/2018   Procedure: LAPAROSCOPIC CHOLECYSTECTOMY WITH INTRAOPERATIVE CHOLANGIOGRAM ERAS PATHWAY;  Surgeon: Donnie Mesa, MD;  Location: [place];  Service: General;  Laterality: N/A;   COLONOSCOPY WITH PROPOFOL N/A 11/02/2019   Procedure: COLONOSCOPY WITH PROPOFOL;  Surgeon: Jerene Bears, MD;  [facility];  Service: Gastroenterology;  Laterality: N/A;   cyst removed from top of buttocks  at age 16   DACRORHINOCYSTOTOMY Bilateral 03/23/2021   Procedure: ENDOSCOPIC DACROCYSTORHINOSTOMY;  Surgeon: Delia Chimes, MD;  Location: [place];  Service: Ophthalmology;  Laterality: Bilateral;   ENDOMETRIAL ABLATION     IR CHOLANGIOGRAM EXISTING TUBE  11/21/2017   IR CHOLANGIOGRAM EXISTING TUBE  01/16/2018   IR EXCHANGE BILIARY DRAIN  11/10/2017   IR PATIENT EVAL TECH 0-60 MINS  02/03/2018   IR PERC CHOLECYSTOSTOMY  09/13/2017   IR RADIOLOGIST EVAL & MGMT  10/08/2017   LACRIMAL DUCT EXPLORATION Right 06/26/2017   Procedure: LACRIMAL DUCT EXPLORATION AND ETHMOIDECTOMY;  Surgeon: Clista Bernhardt, MD;  Location: [place];  Service: Ophthalmology;  Laterality: Right;   LACRIMAL DUCT EXPLORATION Bilateral 03/23/2021   Procedure: NASAL LACRIMAL DUCT EXPLORATION;  Surgeon: Delia Chimes, MD;  Location: [place];  Service:  Ophthalmology;  Laterality: Bilateral;   LACRIMAL TUBE INSERTION Bilateral 03/23/2021   Procedure: INSERTION OF CRAWFORD LACRIMAL STENT;  Surgeon: Delia Chimes, MD;  Location: [place];  Service: Ophthalmology;  Laterality: Bilateral;   ORIF FEMUR FRACTURE Left 07/31/2021   Procedure: left medial femoral condyle fracture fixation;  Surgeon: Meredith Pel, MD;  Location: [place];  Service: Orthopedics;  Laterality: Left;   POLYPECTOMY  11/02/2019   Procedure: POLYPECTOMY;  Surgeon: Jerene Bears, MD;  [facility];  Service: Gastroenterology;;   TEAR DUCT PROBING Right 06/26/2017   Procedure: TEAR DUCT PROBING WITH STENT;  Surgeon: Clista Bernhardt, MD;  Location: [place];  Service: Ophthalmology;  Laterality: Right;   TOTAL KNEE ARTHROPLASTY Right 11/15/2014   Procedure: TOTAL RIGHT KNEE ARTHROPLASTY;  Surgeon: Meredith Pel, MD;  Location: [place];  Service: Orthopedics;  Laterality: Right;   TOTAL KNEE ARTHROPLASTY Left 07/13/2015   Procedure: LEFT TOTAL KNEE ARTHROPLASTY;  Surgeon: Meredith Pel, MD;  Location: [place];  Service: Orthopedics;  Laterality: Left;   Social History   Occupational History   Occupation: disabled  Tobacco Use   Smoking status: Former    Packs/day: 0.50    Years: 10.00    Total pack years: 5.00    Types: Cigarettes    Start date: 1988    Quit date: 2004    Years since quitting: 19.6   Smokeless tobacco: Never   Tobacco comments:    quit smoking in 2004  Vaping Use   Vaping Use: Never used  Substance and Sexual Activity   Alcohol use: Not Currently    Alcohol/week: 1.0 standard drink of alcohol    Types: 1 Glasses of wine per week   Drug use: No   Sexual activity: Not Currently    Birth control/protection: Post-menopausal

## 2021-09-19 NOTE — Telephone Encounter (Signed)
No photo ever received. Will see patient today.

## 2021-09-21 ENCOUNTER — Ambulatory Visit: Payer: Medicare Other | Admitting: Nurse Practitioner

## 2021-09-21 DIAGNOSIS — Z87891 Personal history of nicotine dependence: Secondary | ICD-10-CM | POA: Diagnosis not present

## 2021-09-21 DIAGNOSIS — D869 Sarcoidosis, unspecified: Secondary | ICD-10-CM | POA: Diagnosis not present

## 2021-09-21 DIAGNOSIS — Z79620 Long term (current) use of immunosuppressive biologic: Secondary | ICD-10-CM | POA: Diagnosis not present

## 2021-09-21 DIAGNOSIS — Z7984 Long term (current) use of oral hypoglycemic drugs: Secondary | ICD-10-CM | POA: Diagnosis not present

## 2021-09-21 DIAGNOSIS — W19XXXD Unspecified fall, subsequent encounter: Secondary | ICD-10-CM | POA: Diagnosis not present

## 2021-09-21 DIAGNOSIS — K219 Gastro-esophageal reflux disease without esophagitis: Secondary | ICD-10-CM | POA: Diagnosis not present

## 2021-09-21 DIAGNOSIS — G4700 Insomnia, unspecified: Secondary | ICD-10-CM | POA: Diagnosis not present

## 2021-09-21 DIAGNOSIS — E119 Type 2 diabetes mellitus without complications: Secondary | ICD-10-CM | POA: Diagnosis not present

## 2021-09-21 DIAGNOSIS — E538 Deficiency of other specified B group vitamins: Secondary | ICD-10-CM | POA: Diagnosis not present

## 2021-09-21 DIAGNOSIS — M1990 Unspecified osteoarthritis, unspecified site: Secondary | ICD-10-CM | POA: Diagnosis not present

## 2021-09-21 DIAGNOSIS — M069 Rheumatoid arthritis, unspecified: Secondary | ICD-10-CM | POA: Diagnosis not present

## 2021-09-21 DIAGNOSIS — Z7951 Long term (current) use of inhaled steroids: Secondary | ICD-10-CM | POA: Diagnosis not present

## 2021-09-21 DIAGNOSIS — Z86718 Personal history of other venous thrombosis and embolism: Secondary | ICD-10-CM | POA: Diagnosis not present

## 2021-09-21 DIAGNOSIS — G4733 Obstructive sleep apnea (adult) (pediatric): Secondary | ICD-10-CM | POA: Diagnosis not present

## 2021-09-21 DIAGNOSIS — M797 Fibromyalgia: Secondary | ICD-10-CM | POA: Diagnosis not present

## 2021-09-21 DIAGNOSIS — E559 Vitamin D deficiency, unspecified: Secondary | ICD-10-CM | POA: Diagnosis not present

## 2021-09-21 DIAGNOSIS — Z7901 Long term (current) use of anticoagulants: Secondary | ICD-10-CM | POA: Diagnosis not present

## 2021-09-21 DIAGNOSIS — Z8673 Personal history of transient ischemic attack (TIA), and cerebral infarction without residual deficits: Secondary | ICD-10-CM | POA: Diagnosis not present

## 2021-09-21 DIAGNOSIS — F32A Depression, unspecified: Secondary | ICD-10-CM | POA: Diagnosis not present

## 2021-09-21 DIAGNOSIS — I10 Essential (primary) hypertension: Secondary | ICD-10-CM | POA: Diagnosis not present

## 2021-09-21 DIAGNOSIS — Z9981 Dependence on supplemental oxygen: Secondary | ICD-10-CM | POA: Diagnosis not present

## 2021-09-21 DIAGNOSIS — Z9181 History of falling: Secondary | ICD-10-CM | POA: Diagnosis not present

## 2021-09-21 DIAGNOSIS — S72432D Displaced fracture of medial condyle of left femur, subsequent encounter for closed fracture with routine healing: Secondary | ICD-10-CM | POA: Diagnosis not present

## 2021-09-25 ENCOUNTER — Ambulatory Visit: Payer: Self-pay | Admitting: Licensed Clinical Social Worker

## 2021-09-25 NOTE — Patient Outreach (Signed)
  Care Coordination   Follow Up Visit Note   09/25/2021 Name: Shaneisha Burkel MRN: [id number] DOB: 12-06-1960  Ayesha Markwell is a 61 y.o. year old female who sees Plotnikov, Evie Lacks, MD for primary care. I spoke with  [name] by phone today.  What matters to the patients health and wellness today?  Going to therapy appointment 09/27/21    Goals Addressed             This Visit's Progress    manage emotions/ connect for therapy       Care Coordination Interventions: Solution-Focused Strategies employed:  Active listening / Reflection utilized  Collaborate with [facility] [phone]  Has initial appointment Sept. 14th in person Assessed for barriers related to going to appointment            SDOH assessments and interventions completed:  No    Care Coordination Interventions Activated:  Yes  Care Coordination Interventions:  Yes, provided   Follow up plan: Follow up call scheduled for 10/25/21    Encounter Outcome:  Pt. Visit Completed   Casimer Lanius, [facility] [phone]

## 2021-09-25 NOTE — Patient Instructions (Signed)
Visit Information  Thank you for taking time to visit with me today. Please don't hesitate to contact me if I can be of assistance to you.   Following are the goals we discussed today:   Goals Addressed             This Visit's Progress    manage emotions/ connect for therapy       Care Coordination Interventions: Solution-Focused Strategies employed:  Active listening / Reflection utilized  Collaborate with [facility] [phone]  Has initial appointment Sept. 14th in person Assessed for barriers related to going to appointment           Our next appointment is by telephone on 10/25/21 at 3:30  Please call the care guide team at [phone] if you need to cancel or reschedule your appointment.   If you are experiencing a Mental Health or [place] or need someone to talk to, please call the Suicide and Crisis Lifeline: 988 call the [place] National Suicide Prevention Lifeline: (531)077-0806 or TTY: 512-720-3365 TTY (740)563-4212) to talk to a trained counselor call 1-800-273-TALK (toll free, 24 hour hotline) go to [facility] [address] [place] [phone])   Patient verbalizes understanding of instructions and care plan provided today and agrees to view in [place]. Active MyChart status and patient understanding of how to access instructions and care plan via MyChart confirmed with patient.     Casimer Lanius, [place] [phone]

## 2021-09-28 ENCOUNTER — Telehealth: Payer: Self-pay

## 2021-09-28 DIAGNOSIS — S72432D Displaced fracture of medial condyle of left femur, subsequent encounter for closed fracture with routine healing: Secondary | ICD-10-CM | POA: Diagnosis not present

## 2021-09-28 DIAGNOSIS — Z8673 Personal history of transient ischemic attack (TIA), and cerebral infarction without residual deficits: Secondary | ICD-10-CM | POA: Diagnosis not present

## 2021-09-28 DIAGNOSIS — Z9181 History of falling: Secondary | ICD-10-CM | POA: Diagnosis not present

## 2021-09-28 DIAGNOSIS — F32A Depression, unspecified: Secondary | ICD-10-CM | POA: Diagnosis not present

## 2021-09-28 DIAGNOSIS — E119 Type 2 diabetes mellitus without complications: Secondary | ICD-10-CM | POA: Diagnosis not present

## 2021-09-28 DIAGNOSIS — W19XXXD Unspecified fall, subsequent encounter: Secondary | ICD-10-CM | POA: Diagnosis not present

## 2021-09-28 DIAGNOSIS — M797 Fibromyalgia: Secondary | ICD-10-CM | POA: Diagnosis not present

## 2021-09-28 DIAGNOSIS — M1990 Unspecified osteoarthritis, unspecified site: Secondary | ICD-10-CM | POA: Diagnosis not present

## 2021-09-28 DIAGNOSIS — D869 Sarcoidosis, unspecified: Secondary | ICD-10-CM | POA: Diagnosis not present

## 2021-09-28 DIAGNOSIS — Z87891 Personal history of nicotine dependence: Secondary | ICD-10-CM | POA: Diagnosis not present

## 2021-09-28 DIAGNOSIS — Z86718 Personal history of other venous thrombosis and embolism: Secondary | ICD-10-CM | POA: Diagnosis not present

## 2021-09-28 DIAGNOSIS — Z7984 Long term (current) use of oral hypoglycemic drugs: Secondary | ICD-10-CM | POA: Diagnosis not present

## 2021-09-28 DIAGNOSIS — Z79620 Long term (current) use of immunosuppressive biologic: Secondary | ICD-10-CM | POA: Diagnosis not present

## 2021-09-28 DIAGNOSIS — Z7901 Long term (current) use of anticoagulants: Secondary | ICD-10-CM | POA: Diagnosis not present

## 2021-09-28 DIAGNOSIS — G4733 Obstructive sleep apnea (adult) (pediatric): Secondary | ICD-10-CM | POA: Diagnosis not present

## 2021-09-28 DIAGNOSIS — I10 Essential (primary) hypertension: Secondary | ICD-10-CM | POA: Diagnosis not present

## 2021-09-28 DIAGNOSIS — M069 Rheumatoid arthritis, unspecified: Secondary | ICD-10-CM | POA: Diagnosis not present

## 2021-09-28 DIAGNOSIS — Z7951 Long term (current) use of inhaled steroids: Secondary | ICD-10-CM | POA: Diagnosis not present

## 2021-09-28 DIAGNOSIS — E559 Vitamin D deficiency, unspecified: Secondary | ICD-10-CM | POA: Diagnosis not present

## 2021-09-28 DIAGNOSIS — G4700 Insomnia, unspecified: Secondary | ICD-10-CM | POA: Diagnosis not present

## 2021-09-28 DIAGNOSIS — Z9981 Dependence on supplemental oxygen: Secondary | ICD-10-CM | POA: Diagnosis not present

## 2021-09-28 DIAGNOSIS — E538 Deficiency of other specified B group vitamins: Secondary | ICD-10-CM | POA: Diagnosis not present

## 2021-09-28 DIAGNOSIS — K219 Gastro-esophageal reflux disease without esophagitis: Secondary | ICD-10-CM | POA: Diagnosis not present

## 2021-09-28 MED ORDER — NYSTATIN 100000 UNIT/GM EX OINT: 1.0000 | TOPICAL_OINTMENT | Freq: Two times a day (BID) | CUTANEOUS | 0 refills | Status: DC

## 2021-09-28 NOTE — Telephone Encounter (Signed)
[name] is calling to report that the pt has a yeast rash under both breast and requesting treatment.  Kelly reporting that pt is being discharge from home PT and is recommending that she continue treatment with [facility] for outpatient PT.  Fax [phone]

## 2021-09-28 NOTE — Telephone Encounter (Signed)
Sent in nystatin ointment to use twice a day on the rash. Follow up if no improvement.

## 2021-09-28 NOTE — Telephone Encounter (Signed)
MD is out of the office pls advise../lmb 

## 2021-10-01 ENCOUNTER — Other Ambulatory Visit: Payer: Self-pay | Admitting: Adult Health

## 2021-10-02 ENCOUNTER — Other Ambulatory Visit: Payer: Self-pay | Admitting: Internal Medicine

## 2021-10-03 NOTE — Progress Notes (Deleted)
Office Visit    Patient Name: [name] Date of Encounter: 10/03/2021  Primary Care Provider:  Cassandria Anger, MD Primary Cardiologist:  Jenkins Rouge, MD Primary Electrophysiologist: None  Chief Complaint    [name] is a 61 y.o. 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  Past Medical History    Past Medical History:  Diagnosis Date   ALLERGIC RHINITIS 10/26/2009   no current problems   Anxiety    B12 DEFICIENCY 08/25/2007   takes B12 supplement   Complication of anesthesia    pt has had difficulty following anesthesia with her knee in 2016-unable to care for herself afterward   Depression    takes effexor xr daily   Diabetes mellitus without complication ([place])    was on insulin but has been off since Nov 2015 and now only takes Metformin daily   DYSPNEA 04/28/2009   uses oxygen 24/7, on 2L via [place]   Esophageal reflux    takes Nexium daily   Fibromyalgia    Headache    last migraine 2-48yr ago;takes Topamax daily   History of shingles    Hypertension    takes Coreg daily   Insomnia    takes Nortriptyline nightly    Long-term memory loss    OSA (obstructive sleep apnea)    doesn't use CPAP;sleep study in epic from 2006   Osteoarthritis    Panic attacks    PONV (postoperative nausea and vomiting)    on time in 2016 knee surgery   Rheumatoid arthritis ([place]    Sarcoidosis    Dr. ZLolita Patella  Stroke ([facility]    Vitamin D deficiency    is supposed to take Vit D but can't afford it   Past Surgical History:  Procedure Laterality Date   APPENDECTOMY     arthroscopic knee surgery Right 11-12-04   AXILLARY ABCESS IRRIGATION AND DEBRIDEMENT  Jul & Aug2012   BIOPSY  11/02/2019   Procedure: BIOPSY;  Surgeon: PJerene Bears MD;  [facility];  Service: Gastroenterology;;    CARPAL TUNNEL RELEASE Left 05/23/2014   Procedure: CARPAL TUNNEL RELEASE;  Surgeon: SMeredith Pel MD;  [place]  Service: Orthopedics;  Laterality: Left;   CHOLECYSTECTOMY N/A 02/11/2018   Procedure: LAPAROSCOPIC CHOLECYSTECTOMY WITH INTRAOPERATIVE CHOLANGIOGRAM ERAS PATHWAY;  Surgeon: TDonnie Mesa MD;  [facility]  Service: General;  Laterality: N/A;   COLONOSCOPY WITH PROPOFOL N/A 11/02/2019   Procedure: COLONOSCOPY WITH PROPOFOL;  Surgeon: PJerene Bears MD;  [facility];  Service: Gastroenterology;  Laterality: N/A;   cyst removed from top of buttocks  at age 61  DACRORHINOCYSTOTOMY Bilateral 03/23/2021   Procedure: ENDOSCOPIC DACROCYSTORHINOSTOMY;  Surgeon: LDelia Chimes MD;  [place] [facility]  Service: Ophthalmology;  Laterality: Bilateral;   ENDOMETRIAL ABLATION     IR CHOLANGIOGRAM EXISTING TUBE  11/21/2017   IR CHOLANGIOGRAM EXISTING TUBE  01/16/2018   IR EXCHANGE BILIARY DRAIN  11/10/2017   IR PATIENT EVAL TECH 0-60 MINS  02/03/2018   IR PERC CHOLECYSTOSTOMY  09/13/2017   IR RADIOLOGIST EVAL & MGMT  10/08/2017   LACRIMAL DUCT EXPLORATION Right 06/26/2017   Procedure: LACRIMAL DUCT EXPLORATION AND ETHMOIDECTOMY;  Surgeon: AClista Bernhardt MD;  [facility] [facility]  Service: Ophthalmology;  Laterality: Right;   LACRIMAL DUCT EXPLORATION Bilateral 03/23/2021   Procedure: NASAL  LACRIMAL DUCT EXPLORATION;  Surgeon: Delia Chimes, MD;  Location: [place];  Service: Ophthalmology;  Laterality: Bilateral;   LACRIMAL TUBE INSERTION Bilateral 03/23/2021   Procedure: INSERTION OF CRAWFORD LACRIMAL STENT;  Surgeon: Delia Chimes, MD;  Location: [place];  Service: Ophthalmology;  Laterality: Bilateral;   ORIF FEMUR FRACTURE Left 07/31/2021   Procedure: left medial femoral condyle fracture fixation;  Surgeon: Meredith Pel, MD;  Location: [place];  Service: Orthopedics;  Laterality: Left;   POLYPECTOMY  11/02/2019   Procedure: POLYPECTOMY;  Surgeon: Jerene Bears, MD;   [facility];  Service: Gastroenterology;;   TEAR DUCT PROBING Right 06/26/2017   Procedure: TEAR DUCT PROBING WITH STENT;  Surgeon: Clista Bernhardt, MD;  Location: [place];  Service: Ophthalmology;  Laterality: Right;   TOTAL KNEE ARTHROPLASTY Right 11/15/2014   Procedure: TOTAL RIGHT KNEE ARTHROPLASTY;  Surgeon: Meredith Pel, MD;  Location: [place];  Service: Orthopedics;  Laterality: Right;   TOTAL KNEE ARTHROPLASTY Left 07/13/2015   Procedure: LEFT TOTAL KNEE ARTHROPLASTY;  Surgeon: Meredith Pel, MD;  Location: [place];  Service: Orthopedics;  Laterality: Left;    Allergies  Allergies  Allergen Reactions   Belsomra [Suvorexant] Other (See Comments)    [place] out of bed while asleep: "I'm waking up as I'm falling on the floor;" "Night terrors"   Enalapril Maleate Cough   Latex Hives   Nickel Other (See Comments)    Blisters  Pt has a titanium right and left knee - nickel causes skin irritations that form into blisters and sores   Other Other (See Comments)    Patient has Sarcoidosis and can't tolerate any metals   Morphine And Related Itching and Nausea Only   Doxycycline Diarrhea and Nausea And Vomiting   Hydrochlorothiazide Other (See Comments)    Low potassium levels    Hydroxychloroquine Sulfate Other (See Comments)    Vision changes    Lyrica [Pregabalin] Other (See Comments)    "Made me feel high"   Tape Other (See Comments)    Medical tape causes bruising    History of Present Illness   [name] is a 61 year old female with the above-mentioned past medical history who presents today for follow-up of hypertension and nonobstructive CAD.  She is on lifelong anticoagulation for history of PE/DVT.  She was initially seen in 2014 for complaint of atypical chest pain.  She underwent Lexiscan Myoview that was normal.  She had TTE completed 11/2016 with EF 60-65% no significant valve disease or signs of pulmonary HTN.     She was seen in  follow-up by Dr. Johnsie Cancel in 2020 and reported no complaints of chest pain at that time.  She had received preoperative clearance for gallbladder surgery during visit.  She was last seen by Truitt Merle on 08/2019 for annual follow-up.  She was doing well from the cardiovascular perspective but was having concerns with GI issues and frequent diarrhea.  She is on continuous oxygen due to sarcoidosis and is being followed by pulmonary.      Since last being seen in the office patient reports***.  Patient denies chest pain, palpitations, dyspnea, PND, orthopnea, nausea, vomiting, dizziness, syncope, edema, weight gain, or early satiety.     ***Notes:  Home Medications    Current Outpatient Medications  Medication Sig Dispense Refill   acetaminophen (TYLENOL) 325 MG tablet Take 1-2 tablets (325-650 mg total) by mouth every 6 (six) hours as needed for mild pain (pain score 1-3 or  temp > 100.5). 30 tablet 1   albuterol (VENTOLIN HFA) 108 (90 Base) MCG/ACT inhaler INHALE TWO puffs into THE lungs EVERY SIX HOURS AS NEEDED wheezind AND For SHORTNESS OF BREATH 6.7 g 3   B-D ULTRA-FINE 33 LANCETS MISC Use as instructed to test sugars once  daily 100 each 12   budesonide-formoterol (SYMBICORT) 160-4.5 MCG/ACT inhaler Inhale 2 puffs into the lungs TWICE DAILY (Patient taking differently: 2 puffs 2 (two) times daily.) 10.2 g 5   carvedilol (COREG) 12.5 MG tablet Take 1 tablet (12.5 mg total) by mouth 2 (two) times daily with a meal. 180 tablet 1   Cholecalciferol (VITAMIN D-3) 125 MCG (5000 UT) TABS Take 5,000 Units by mouth daily.     Cyanocobalamin (VITAMIN B-12) 1000 MCG SUBL Place 1 tablet (1,000 mcg total) under the tongue daily. (Patient taking differently: Place 1,000 mcg under the tongue daily.) 100 tablet 3   dexlansoprazole (DEXILANT) 60 MG capsule TAKE ONE CAPSULE BY MOUTH EVERY DAY 90 capsule 1   gabapentin (NEURONTIN) 300 MG capsule TAKE ONE CAPSULE BY MOUTH EVERY MORNING **May take a second  capsule during THE DAY AS NEEDED FOR PAIN 180 capsule 0   glucose blood (ACCU-CHEK GUIDE) test strip Use to check blood sugar once a day. 100 each 12   HUMIRA PEN 40 MG/0.4ML PNKT Inject 40 mg as directed every Wednesday.      hydrOXYzine (ATARAX) 25 MG tablet Take 1 tablet (25 mg total) by mouth every 8 (eight) hours as needed. (Patient taking differently: Take 6.25-12.5 mg by mouth See admin instructions. 6.25 mg in the morning, 12.5 mg at bedtime) 60 tablet 3   metFORMIN (GLUCOPHAGE-XR) 500 MG 24 hr tablet Take 2 tablets (1,000 mg total) by mouth at bedtime. Annual appt due in July must see provider for future refills 180 tablet 0   methocarbamol (ROBAXIN) 500 MG tablet Take 1 tablet (500 mg total) by mouth every 6 (six) hours as needed for muscle spasms. 30 tablet 0   Multiple Vitamins-Minerals (MULTIVITAMIN WOMEN 50+) TABS Take 1 tablet by mouth daily.     naloxone (NARCAN) nasal spray 4 mg/0.1 mL Place 1 spray into the nose once as needed (overdose).     naloxone (NARCAN) nasal spray 4 mg/0.1 mL 1 actuation in one nostril once. May repeat in 2-3 min 1 each 2   neomycin-polymyxin b-dexamethasone (MAXITROL) 3.5-10000-0.1 SUSP Place 1 drop into both eyes 4 (four) times daily as needed (eye irritation).     nystatin ointment (MYCOSTATIN) Apply 1 Application topically 2 (two) times daily. 100 g 0   oxyCODONE (OXYCONTIN) 10 mg 12 hr tablet Take 1 tablet (10 mg total) by mouth every 12 (twelve) hours. 60 tablet 0   OXYGEN Inhale 2 L into the lungs continuous.     polyvinyl alcohol (LIQUIFILM TEARS) 1.4 % ophthalmic solution Place 1 drop into both eyes as needed for dry eyes. 15 mL 0   potassium chloride (KLOR-CON M) 10 MEQ tablet Take 1 tablet (10 mEq total) by mouth daily. Follow-up appt is due must see MD for future refills 90 tablet 0   QUEtiapine (SEROQUEL) 50 MG tablet Take 1 tab in am and 2 tab at HS 90 tablet 5   rivaroxaban (XARELTO) 20 MG TABS tablet TAKE ONE TABLET BY MOUTH AT BEDTIME 90  tablet 0   topiramate (TOPAMAX) 50 MG tablet Take 1 tablet (50 mg total) by mouth 2 (two) times daily. 180 tablet 1   venlafaxine XR (EFFEXOR-XR) 75  MG 24 hr capsule Take 1 capsule (75 mg total) by mouth daily with breakfast. 30 capsule 11   No current facility-administered medications for this visit.     Review of Systems  Please see the history of present illness.    (+)*** (+)***  All other systems reviewed and are otherwise negative except as noted above.  Physical Exam    Wt Readings from Last 3 Encounters:  08/28/21 258 lb (117 kg)  07/31/21 295 lb 10.2 oz (134.1 kg)  07/02/21 269 lb (122 kg)   JO:INOMV were no vitals filed for this visit.,There is no height or weight on file to calculate BMI.  Constitutional:      Appearance: Healthy appearance. Not in distress.  Neck:     Vascular: JVD normal.  Pulmonary:     Effort: Pulmonary effort is normal.     Breath sounds: No wheezing. No rales. Diminished in the bases Cardiovascular:     Normal rate. Regular rhythm. Normal S1. Normal S2.      Murmurs: There is no murmur.  Edema:    Peripheral edema absent.  Abdominal:     Palpations: Abdomen is soft non tender. There is no hepatomegaly.  Skin:    General: Skin is warm and dry.  Neurological:     General: No focal deficit present.     Mental Status: Alert and oriented to person, place and time.     Cranial Nerves: Cranial nerves are intact.  EKG/LABS/Other Studies Reviewed    ECG personally reviewed by me today - ***  Risk Assessment/Calculations:   {Does this patient have ATRIAL FIBRILLATION?[phone]}        Lab Results  Component Value Date   WBC 7.2 07/31/2021   HGB 12.3 07/31/2021   HCT 38.6 07/31/2021   MCV 99.5 07/31/2021   PLT 198 07/31/2021   Lab Results  Component Value Date   CREATININE 0.88 07/31/2021   BUN 22 07/31/2021   NA 144 07/31/2021   K 3.9 07/31/2021   CL 107 07/31/2021   CO2 22 07/31/2021   Lab Results  Component Value Date    ALT 15 07/02/2021   AST 18 07/02/2021   ALKPHOS 69 07/02/2021   BILITOT 0.4 07/02/2021   Lab Results  Component Value Date   CHOL 210 (H) 07/19/2020   HDL 77.20 07/19/2020   LDLCALC 111 (H) 07/19/2020   LDLDIRECT 107.0 06/30/2017   TRIG 109.0 07/19/2020   CHOLHDL 3 07/19/2020    Lab Results  Component Value Date   HGBA1C 5.1 03/21/2021    Assessment & Plan    1.  Nonobstructive CAD: -Coronary atherosclerosis noted on CT of the chest in 2020 -Patient currently not on ASA due to Xarelto -Today patient reports***   2. .  Essential hypertension: -Blood pressure today was*** -Continue carvedilol 12.5 mg twice daily   3.  Pulmonary sarcoidosis: -2D echo completed 11/2016 with EF 60-65% no significant valve disease or signs of pulmonary HTN.     4.  History of PE/DVT: -lifelong anticoagulation for history of PE/DVT -Continue Xarelto 20 mg daily        Disposition: Follow-up with Jenkins Rouge, MD or APP in *** months {Are you ordering a CV Procedure (e.g. stress test, cath, DCCV, TEE, etc)?   Press F2        :[id number]}   Medication Adjustments/Labs and Tests Ordered: Current medicines are reviewed at length with the patient today.  Concerns regarding medicines are outlined above.   Signed,  Mable Fill, Marissa Nestle, NP 10/03/2021, 1:07 PM [place]

## 2021-10-05 ENCOUNTER — Ambulatory Visit: Payer: Medicare Other | Admitting: Nurse Practitioner

## 2021-10-05 DIAGNOSIS — I10 Essential (primary) hypertension: Secondary | ICD-10-CM

## 2021-10-07 DIAGNOSIS — J449 Chronic obstructive pulmonary disease, unspecified: Secondary | ICD-10-CM | POA: Diagnosis not present

## 2021-10-08 ENCOUNTER — Ambulatory Visit: Payer: Self-pay
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
## 2021-10-08 NOTE — Patient Instructions (Addendum)
Visit Information  Thank you for taking time to visit with me today. Please don't hesitate to contact me if I can be of assistance to you.   Following are the goals we discussed today:   Goals Addressed             This Visit's Progress    COMPLETED: Care Coordinaton: Continue to improve post SNF rehab stay       Care Coordination Interventions: Advised patient to continue to attend therapy sessions as scheduled Reviewed upcoming appointments and encouraged to attend as scheduled Active listening Message to orthopedic provider with patient request for outpatient physical therapy Provided care guide contact information and sent in-basket message to notify care guide that patient did not receive resources. Discussed non pharmacological pain relief remedies such as relaxation, massage, acupuncture, heat, ice Encouraged patient to continue to take medications as prescribed and contact provider with any questions or concerns         Our next appointment is by telephone on 10/22/21 at 11:00 am  Please call the care guide team at [phone] if you need to cancel or reschedule your appointment.   If you are experiencing a Mental Health or [place] or need someone to talk to, please call the Suicide and Crisis Lifeline: 988  Patient verbalizes understanding of instructions and care plan provided today and agrees to view in [place]. Active MyChart status and patient understanding of how to access instructions and care plan via MyChart confirmed with patient.     [name], RN, MSN, BSN, [place] Coordinator [phone]

## 2021-10-09 ENCOUNTER — Telehealth: Payer: Self-pay

## 2021-10-09 NOTE — Telephone Encounter (Signed)
   Telephone encounter was:  Successful.  10/09/2021 Name: [name] Reasons MRN: [id number] DOB: August 06, 1960  [name] is a 61 y.o. year old female who is a primary care patient of Plotnikov, Evie Lacks, MD . The community resource team was consulted for assistance with  housing.  Care guide performed the following interventions: -Spoke with patient verified home address to mail housing resources. Letter saved in Epic.   Follow Up Plan:  No further follow up planned at this time. The patient has been provided with needed resources.  [place] Resource Care Guide   ??[email or web address]  ?? [phone]   Website: [email or web address]  [email or web address]  "We don't say no, we SHOW how!"         [facility]

## 2021-10-11 DIAGNOSIS — E1122 Type 2 diabetes mellitus with diabetic chronic kidney disease: Secondary | ICD-10-CM | POA: Diagnosis not present

## 2021-10-11 NOTE — Unmapped (Signed)
[facility] Refill Coordination Note    Specialty Medication(s) to be Shipped:   Inflammatory Disorders: Humira    Other medication(s) to be shipped: No additional medications requested for fill at this time     Carolyn Newton, DOB: 04-13-1960  Phone: [phone] (home)       All above HIPAA information was verified with patient.     Was a Nurse, learning disability used for this call? No    Completed refill call assessment today to schedule patient's medication [facility] [phone]).  All relevant notes have been reviewed.     Specialty medication(s) and dose(s) confirmed: Regimen is correct and unchanged.   Changes to medications: Carolyn Newton reports no changes at this time.  Changes to insurance: No  New side effects reported not previously addressed with a pharmacist or physician: None reported  Questions for the pharmacist: No    Confirmed patient received a Conservation officer, historic buildings and a Surveyor, mining with first shipment. The patient will receive a drug information handout for each medication shipped and additional FDA Medication Guides as required.       DISEASE/MEDICATION-SPECIFIC INFORMATION        For patients on injectable medications: Patient currently has 1 doses left.  Next injection is scheduled for 10/4.    SPECIALTY MEDICATION ADHERENCE     Medication Adherence    Patient reported X missed doses in the last month: 0  Specialty Medication: Humira  Patient is on additional specialty medications: No  Patient is on more than two specialty medications: No  Any gaps in refill history greater than 2 weeks in the last 3 months: no  Demonstrates understanding of importance of adherence: yes  Informant: patient                          Were doses missed due to medication being on hold? No    Humira 40mg /0.15ml: Patient has 7 days of medication on hand     REFERRAL TO PHARMACIST     Referral to the pharmacist: Not needed      [facility]     Shipping address confirmed in Epic.     Delivery Scheduled: Yes, Expected medication delivery date: 10/5.     Medication will be delivered via UPS to the prescription address in Epic WAM.    Carolyn Newton   [facility] Specialty Technician

## 2021-10-13 ENCOUNTER — Other Ambulatory Visit: Payer: Self-pay | Admitting: Internal Medicine

## 2021-10-15 ENCOUNTER — Ambulatory Visit (INDEPENDENT_AMBULATORY_CARE_PROVIDER_SITE_OTHER): Payer: Medicare Other

## 2021-10-15 DIAGNOSIS — Z23 Encounter for immunization: Secondary | ICD-10-CM

## 2021-10-22 ENCOUNTER — Ambulatory Visit: Payer: Self-pay

## 2021-10-22 ENCOUNTER — Other Ambulatory Visit: Payer: Self-pay | Admitting: Internal Medicine

## 2021-10-22 MED FILL — HUMIRA PEN CITRATE FREE 40 MG/0.4 ML: SUBCUTANEOUS | 28 days supply | Qty: 4 | Fill #4

## 2021-10-22 NOTE — Patient Instructions (Signed)
Visit Information  Thank you for taking time to visit with me today. Please don't hesitate to contact me if I can be of assistance to you.   Following are the goals we discussed today:   Goals Addressed             This Visit's Progress    Care Coordinaton: Continue to improve post SNF rehab stay       Care Coordination Interventions: Advised patient to continue to attend counseling therapy sessions as scheduled Reviewed upcoming appointments and encouraged to attend as scheduled Care Coordinator recommended patient call to schedule an appointment with orthopedic provider for evaluation and plan or care for L knee pain Provided contact number to Ombudsman with [facility]    Phone: [phone]       Our next appointment is by telephone on 11/20/21 at 11:00 am  Please call the care guide team at [phone] if you need to cancel or reschedule your appointment.   If you are experiencing a Mental Health or [place] or need someone to talk to, please call the Suicide and Crisis Lifeline: 988  Patient verbalizes understanding of instructions and care plan provided today and agrees to view in [place]. Active MyChart status and patient understanding of how to access instructions and care plan via MyChart confirmed with patient.     [name], RN, MSN, BSN, CCM Care Coordinator [phone]

## 2021-10-22 NOTE — Patient Outreach (Signed)
  Care Coordination   Follow Up Visit Note   10/22/2021 Name: Anyla Israelson MRN: [id number] DOB: 1960-04-21  Adiah Guereca is a 61 y.o. year old female who sees Plotnikov, Evie Lacks, MD for primary care. I spoke with  [name] by phone today.  What matters to the patients health and wellness today?  Patient reports continues stress, anxiety related to skilled facility stay. She reports she continues to attend counseling therapy sessions as schedule. She reports left knee pain is "7" on the pain scale today and states she has been released by the orthopedic provider. She states in talking with therapist, she has noticed the more stress and anxious she is the higher the pain score. She continues to be able to perform activities of daily living. Ms. Vandemark states she is getting ready to go to a counseling session and will call when she gets back.   Goals Addressed             This Visit's Progress    Care Coordinaton: Continue to improve post SNF rehab stay       Care Coordination Interventions: Advised patient to continue to attend counseling therapy sessions as scheduled Reviewed upcoming appointments and encouraged to attend as scheduled Care Coordinator recommended patient call to schedule an appointment with orthopedic provider for evaluation and plan or care for L knee pain Provided contact number to Ombudsman with [facility]    Phone: [phone]       SDOH assessments and interventions completed:  No  Care Coordination Interventions Activated:  Yes  Care Coordination Interventions:  Yes, provided   Follow up plan: Follow up call scheduled for 11/20/21    Encounter Outcome:  Pt. Visit Completed   [name], RN, MSN, BSN, [place] Coordinator [phone]

## 2021-10-24 ENCOUNTER — Ambulatory Visit: Payer: Medicare Other

## 2021-10-24 ENCOUNTER — Inpatient Hospital Stay: Admission: RE | Admit: 2021-10-24 | Payer: Medicare Other | Source: Ambulatory Visit

## 2021-10-25 ENCOUNTER — Ambulatory Visit: Payer: Self-pay | Admitting: Licensed Clinical Social Worker

## 2021-10-25 NOTE — Patient Outreach (Signed)
  Care Coordination  Follow Up Visit Note   10/25/2021 Name: [name] MRN: [id number] DOB: 1960-12-14  [name] is a 61 y.o. year old female who sees Plotnikov, Evie Lacks, MD for primary care. I spoke with  [name] by phone today.  What matters to the patients health and wellness today?  Managing her mental health  Patient is making progress with and has connected with her therapist .  Currently meeting with her weekly to address symptoms of PTSD Recommendation: Patient may benefit from, and is in agreement to Continue with therapy Follow up on with Ombudsman at [facility]  Phone: [phone]     Goals Addressed             This Visit's Progress    COMPLETED: Connect for therapy       Care Coordination Interventions: Solution-Focused Strategies employed:  Active listening / Reflection utilized  Problem Solving [place] reviewed Discuss progress with therapist at [place]  Discussed concerns and barriers of not connecting with Ombudsman at [facility]         SDOH assessments and interventions completed:  No   Care Coordination Interventions Activated:  Yes  Care Coordination Interventions:  Yes, provided   Follow up plan:  No further intervention required by Social work Warehouse manager at this time . Patient will continue to work with RN Care Coordinator    Encounter Outcome:  Pt. Visit Completed   Casimer Lanius, [place] [phone]

## 2021-10-25 NOTE — Patient Instructions (Signed)
Visit Information  Thank you for taking time to visit with me today. Please don't hesitate to contact me if I can be of assistance to you.   Following are the goals we discussed today:   Goals Addressed             This Visit's Progress    COMPLETED: Connect for therapy       Care Coordination Interventions: Solution-Focused Strategies employed:  Active listening / Reflection utilized  Problem Solving [place] reviewed Discuss progress with therapist at [place]  Discussed concerns and barriers of not connecting with Ombudsman at [facility]         Please call the care guide team at [phone] if you need to cancel or reschedule your appointment.   If you are experiencing a Mental Health or [place] or need someone to talk to, please call the Suicide and Crisis Lifeline: 988 call the [place] National Suicide Prevention Lifeline: (813)574-1329 or TTY: 979-744-7815 TTY (430)790-3540) to talk to a trained counselor call 1-800-273-TALK (toll free, 24 hour hotline) go to [facility] [address], [place] [phone])   Patient verbalizes understanding of instructions and care plan provided today and agrees to view in [place]. Active MyChart status and patient understanding of how to access instructions and care plan via MyChart confirmed with patient.     No further follow up required: by Social Work   Casimer Lanius, [place] [phone]

## 2021-11-06 DIAGNOSIS — J449 Chronic obstructive pulmonary disease, unspecified: Secondary | ICD-10-CM | POA: Diagnosis not present

## 2021-11-07 DIAGNOSIS — I10 Essential (primary) hypertension: Secondary | ICD-10-CM | POA: Diagnosis not present

## 2021-11-07 DIAGNOSIS — H35033 Hypertensive retinopathy, bilateral: Secondary | ICD-10-CM | POA: Diagnosis not present

## 2021-11-07 DIAGNOSIS — H25813 Combined forms of age-related cataract, bilateral: Secondary | ICD-10-CM | POA: Diagnosis not present

## 2021-11-07 DIAGNOSIS — E119 Type 2 diabetes mellitus without complications: Secondary | ICD-10-CM | POA: Diagnosis not present

## 2021-11-07 DIAGNOSIS — H524 Presbyopia: Secondary | ICD-10-CM | POA: Diagnosis not present

## 2021-11-10 ENCOUNTER — Other Ambulatory Visit: Payer: Self-pay | Admitting: Internal Medicine

## 2021-11-13 NOTE — Unmapped (Signed)
[facility] Refill Coordination Note    Specialty Medication(s) to be Shipped:   Inflammatory Disorders: Humira    Other medication(s) to be shipped: No additional medications requested for fill at this time     Carolyn Newton, DOB: 1960/04/02  Phone: [phone] (home)       All above HIPAA information was verified with patient.     Was a Nurse, learning disability used for this call? No    Completed refill call assessment today to schedule patient's medication shipment from the [facility] [phone]).  All relevant notes have been reviewed.     Specialty medication(s) and dose(s) confirmed: Regimen is correct and unchanged.   Changes to medications: Carolyn Newton reports no changes at this time.  Changes to insurance: No  New side effects reported not previously addressed with a pharmacist or physician: None reported  Questions for the pharmacist: No    Confirmed patient received a Conservation officer, historic buildings and a Surveyor, mining with first shipment. The patient will receive a drug information handout for each medication shipped and additional FDA Medication Guides as required.       DISEASE/MEDICATION-SPECIFIC INFORMATION        For patients on injectable medications: Patient currently has 1 doses left.  Next injection is scheduled for 11/1.    SPECIALTY MEDICATION ADHERENCE     Medication Adherence    Patient reported X missed doses in the last month: 0  Specialty Medication: Humira  Patient is on additional specialty medications: No  Any gaps in refill history greater than 2 weeks in the last 3 months: no  Demonstrates understanding of importance of adherence: yes  Informant: patient  Reliability of informant: reliable              Confirmed plan for next specialty medication refill: delivery by pharmacy  Refills needed for supportive medications: not needed              Were doses missed due to medication being on hold? No    Humira 40mg /0.40ml: Patient has 7 days of medication on hand REFERRAL TO PHARMACIST     Referral to the pharmacist: Not needed      [facility]     Shipping address confirmed in Epic.     Delivery Scheduled: Yes, Expected medication delivery date: 11/2.     Medication will be delivered via UPS to the prescription address in Epic WAM.    Carolyn Newton   [facility] Specialty Technician

## 2021-11-14 MED FILL — HUMIRA PEN CITRATE FREE 40 MG/0.4 ML: SUBCUTANEOUS | 28 days supply | Qty: 4 | Fill #5

## 2021-11-20 ENCOUNTER — Telehealth: Payer: Self-pay

## 2021-11-20 ENCOUNTER — Encounter: Payer: Self-pay | Admitting: Internal Medicine

## 2021-11-20 ENCOUNTER — Ambulatory Visit (INDEPENDENT_AMBULATORY_CARE_PROVIDER_SITE_OTHER): Payer: Medicare Other | Admitting: Internal Medicine

## 2021-11-20 ENCOUNTER — Telehealth: Payer: Self-pay | Admitting: *Deleted

## 2021-11-20 VITALS — BP 140/86 | HR 65 | Temp 98.4°F | Ht 67.0 in | Wt 263.0 lb

## 2021-11-20 DIAGNOSIS — J441 Chronic obstructive pulmonary disease with (acute) exacerbation: Secondary | ICD-10-CM | POA: Diagnosis not present

## 2021-11-20 DIAGNOSIS — J209 Acute bronchitis, unspecified: Secondary | ICD-10-CM

## 2021-11-20 MED ORDER — BENZONATATE 200 MG PO CAPS: 200.0000 mg | ORAL_CAPSULE | Freq: Three times a day (TID) | ORAL | 0 refills | Status: DC | PRN

## 2021-11-20 MED ORDER — AMOXICILLIN-POT CLAVULANATE 875-125 MG PO TABS: 1.0000 | ORAL_TABLET | Freq: Two times a day (BID) | ORAL | 0 refills | Status: AC

## 2021-11-20 MED ORDER — PREDNISONE 20 MG PO TABS: 40.0000 mg | ORAL_TABLET | Freq: Every day | ORAL | 0 refills | Status: AC

## 2021-11-20 MED ORDER — LIDOCAINE VISCOUS HCL 2 % MT SOLN: 15.0000 mL | Freq: Four times a day (QID) | OROMUCOSAL | 0 refills | Status: DC | PRN

## 2021-11-20 NOTE — Progress Notes (Signed)
  Care Coordination Note  11/20/2021 Name: [name] MRN: [id number] DOB: [birth year]  [name] is a 61 y.o. year old female who is a primary care patient of Plotnikov, Evie Lacks, MD and is actively engaged with the care management team. I reached out to [facility] by phone today to assist with re-scheduling a follow up visit with the Pharmacist  Follow up plan: Unsuccessful telephone outreach attempt made. A HIPAA compliant phone message was left for the patient providing contact information and requesting a return call.   [name], [place] Direct Dial: [phone]

## 2021-11-20 NOTE — Patient Instructions (Signed)
We have sent in augmentin to take 1 pill twice a day for 1 week.  If not improving in 2-3 days it is okay to start prednisone 2 pills daily for 5 days.  We have sent in lidocaine to swish and swallow for the throat pain.  We have sent in tessalon perles for the cough to use up to 3 times a day.

## 2021-11-20 NOTE — Assessment & Plan Note (Signed)
Suspect given symptoms and length versus COPD exacerbation. Rx augmentin 1 week and prednisone to start taking in 2-3 days if no relief. Rx tessalon perles for cough. Rx lidocaine liquid for sore throat.

## 2021-11-20 NOTE — Patient Outreach (Signed)
  Care Coordination   11/20/2021 Name: [name] MRN: [id number] DOB: 1960-01-29   Care Coordination Outreach Attempts:  An unsuccessful telephone outreach was attempted today to offer the patient information about available care coordination services as a benefit of their health plan.  RNCM noted patient had an office visit with primary care provider this morning.  Follow Up Plan:  Additional outreach attempts will be made to offer the patient care coordination information and services.   Encounter Outcome:  No Answer  Care Coordination Interventions Activated:  No   Care Coordination Interventions:  No, not indicated    [name], RN, MSN, BSN, [place] Coordinator [phone]

## 2021-11-20 NOTE — Progress Notes (Signed)
   Subjective:   Patient ID: [name], female    DOB: April 17, 1960, 61 y.o.   MRN: [id number]  HPI The patient is a 60 YO female coming in for sick visit.   Review of Systems  Constitutional:  Positive for activity change and appetite change. Negative for chills, fatigue, fever and unexpected weight change.  HENT:  Positive for congestion, postnasal drip, rhinorrhea and sinus pressure. Negative for ear discharge, ear pain, sinus pain, sneezing, sore throat, tinnitus, trouble swallowing and voice change.   Eyes: Negative.   Respiratory:  Positive for cough and shortness of breath. Negative for chest tightness and wheezing.   Cardiovascular: Negative.   Gastrointestinal: Negative.   Musculoskeletal:  Positive for myalgias.  Neurological: Negative.     Objective:  Physical Exam Constitutional:      Appearance: She is well-developed.  HENT:     Head: Normocephalic and atraumatic.     Comments: Oropharynx with redness and clear drainage, nose with swollen turbinates, TMs normal bilaterally.  Neck:     Thyroid: No thyromegaly.  Cardiovascular:     Rate and Rhythm: Normal rate and regular rhythm.  Pulmonary:     Effort: Pulmonary effort is normal. No respiratory distress.     Breath sounds: Rhonchi present. No wheezing or rales.  Abdominal:     Palpations: Abdomen is soft.  Musculoskeletal:        General: Tenderness present.     Cervical back: Normal range of motion.  Lymphadenopathy:     Cervical: No cervical adenopathy.  Skin:    General: Skin is warm and dry.  Neurological:     Mental Status: She is alert and oriented to person, place, and time.     Vitals:   11/20/21 1012  BP: (!) 140/86  Pulse: 65  Temp: 98.4 F (36.9 C)  TempSrc: Oral  SpO2: 95%  Weight: 263 lb (119.3 kg)  Height: '5\' 7"'$  (1.702 m)    Assessment & Plan:

## 2021-11-20 NOTE — Assessment & Plan Note (Signed)
With exacerbation today. Requiring more oxygen. Rx augmentin and prednisone.

## 2021-11-28 ENCOUNTER — Encounter: Payer: Medicare Other | Admitting: Internal Medicine

## 2021-11-28 ENCOUNTER — Encounter: Payer: Self-pay | Admitting: Internal Medicine

## 2021-11-30 ENCOUNTER — Ambulatory Visit: Payer: Medicare Other

## 2021-12-05 NOTE — Unmapped (Signed)
[facility] Refill Coordination Note    Specialty Medication(s) to be Shipped:   Inflammatory Disorders: Humira    Other medication(s) to be shipped: No additional medications requested for fill at this time     Carolyn Newton, DOB: 09/08/1960  Phone: [phone] (home)       All above HIPAA information was verified with patient.     Was a Nurse, learning disability used for this call? No    Completed refill call assessment today to schedule patient's medication shipment from the [facility] [phone]).  All relevant notes have been reviewed.     Specialty medication(s) and dose(s) confirmed: Regimen is correct and unchanged.   Changes to medications: Carolyn Newton reports no changes at this time.  Changes to insurance: No  New side effects reported not previously addressed with a pharmacist or physician: None reported  Questions for the pharmacist: No    Confirmed patient received a Conservation officer, historic buildings and a Surveyor, mining with first shipment. The patient will receive a drug information handout for each medication shipped and additional FDA Medication Guides as required.       DISEASE/MEDICATION-SPECIFIC INFORMATION        For patients on injectable medications: Patient currently has 2 doses left.  Next injection is scheduled for 11/22,11/29.    SPECIALTY MEDICATION ADHERENCE     Medication Adherence    Patient reported X missed doses in the last month: 0  Specialty Medication: Humira 40mg /0.48ml  Patient is on additional specialty medications: No  Patient is on more than two specialty medications: No  Any gaps in refill history greater than 2 weeks in the last 3 months: no  Demonstrates understanding of importance of adherence: yes  Informant: patient  Reliability of informant: reliable  Provider-estimated medication adherence level: good  Patient is at risk for Non-Adherence: No  Reasons for non-adherence: no problems identified                  Confirmed plan for next specialty medication refill: delivery by pharmacy  Refills needed for supportive medications: not needed          Refill Coordination    Has the Patients' Contact Information Changed: No  Is the Shipping Address Different: No         Were doses missed due to medication being on hold? No    humira 40/0.4 mg/ml: 14 days of medicine on hand       REFERRAL TO PHARMACIST     Referral to the pharmacist: Not needed      [facility]     Shipping address confirmed in Epic.     Delivery Scheduled: Yes, Expected medication delivery date: 11/30.     Medication will be delivered via UPS to the prescription address in Epic WAM.    [name]   [facility] Specialty Technician

## 2021-12-07 DIAGNOSIS — J449 Chronic obstructive pulmonary disease, unspecified: Secondary | ICD-10-CM | POA: Diagnosis not present

## 2021-12-10 ENCOUNTER — Other Ambulatory Visit: Payer: Medicare Other

## 2021-12-10 ENCOUNTER — Encounter: Payer: Self-pay | Admitting: Internal Medicine

## 2021-12-10 ENCOUNTER — Ambulatory Visit (INDEPENDENT_AMBULATORY_CARE_PROVIDER_SITE_OTHER): Payer: Medicare Other | Admitting: Internal Medicine

## 2021-12-10 VITALS — BP 124/78 | HR 63 | Temp 97.9°F | Ht 67.0 in | Wt 257.0 lb

## 2021-12-10 DIAGNOSIS — E538 Deficiency of other specified B group vitamins: Secondary | ICD-10-CM

## 2021-12-10 DIAGNOSIS — D690 Allergic purpura: Secondary | ICD-10-CM

## 2021-12-10 DIAGNOSIS — N182 Chronic kidney disease, stage 2 (mild): Secondary | ICD-10-CM

## 2021-12-10 DIAGNOSIS — E1122 Type 2 diabetes mellitus with diabetic chronic kidney disease: Secondary | ICD-10-CM | POA: Diagnosis not present

## 2021-12-10 DIAGNOSIS — R21 Rash and other nonspecific skin eruption: Secondary | ICD-10-CM | POA: Diagnosis not present

## 2021-12-10 DIAGNOSIS — J209 Acute bronchitis, unspecified: Secondary | ICD-10-CM

## 2021-12-10 DIAGNOSIS — L304 Erythema intertrigo: Secondary | ICD-10-CM | POA: Diagnosis not present

## 2021-12-10 LAB — CBC WITH DIFFERENTIAL/PLATELET
Basophils Absolute: 0.1 10*3/uL (ref 0.0–0.1)
Basophils Relative: 1 % (ref 0.0–3.0)
Eosinophils Absolute: 0.4 10*3/uL (ref 0.0–0.7)
Eosinophils Relative: 5.7 % — ABNORMAL HIGH (ref 0.0–5.0)
HCT: 38.3 % (ref 36.0–46.0)
Hemoglobin: 12.7 g/dL (ref 12.0–15.0)
Lymphocytes Relative: 39.1 % (ref 12.0–46.0)
Lymphs Abs: 2.8 10*3/uL (ref 0.7–4.0)
MCHC: 33.2 g/dL (ref 30.0–36.0)
MCV: 96.3 fl (ref 78.0–100.0)
Monocytes Absolute: 0.6 10*3/uL (ref 0.1–1.0)
Monocytes Relative: 9 % (ref 3.0–12.0)
Neutro Abs: 3.2 10*3/uL (ref 1.4–7.7)
Neutrophils Relative %: 45.2 % (ref 43.0–77.0)
Platelets: 214 10*3/uL (ref 150.0–400.0)
RBC: 3.98 Mil/uL (ref 3.87–5.11)
RDW: 14.1 % (ref 11.5–15.5)
WBC: 7.1 10*3/uL (ref 4.0–10.5)

## 2021-12-10 LAB — COMPREHENSIVE METABOLIC PANEL
ALT: 13 U/L (ref 0–35)
AST: 19 U/L (ref 0–37)
Albumin: 4 g/dL (ref 3.5–5.2)
Alkaline Phosphatase: 77 U/L (ref 39–117)
BUN: 13 mg/dL (ref 6–23)
CO2: 28 mEq/L (ref 19–32)
Calcium: 9.3 mg/dL (ref 8.4–10.5)
Chloride: 103 mEq/L (ref 96–112)
Creatinine, Ser: 0.94 mg/dL (ref 0.40–1.20)
GFR: 65.55 mL/min (ref >60.00)
Glucose, Bld: 104 mg/dL — ABNORMAL HIGH (ref 70–99)
Potassium: 3.9 mEq/L (ref 3.5–5.1)
Sodium: 137 mEq/L (ref 135–145)
Total Bilirubin: 0.3 mg/dL (ref 0.2–1.2)
Total Protein: 7.8 g/dL (ref 6.0–8.3)

## 2021-12-10 LAB — URINALYSIS
Bilirubin Urine: NEGATIVE
Hgb urine dipstick: NEGATIVE
Leukocytes,Ua: NEGATIVE
Nitrite: NEGATIVE
Specific Gravity, Urine: 1.02 (ref 1.000–1.030)
Total Protein, Urine: NEGATIVE
Urine Glucose: NEGATIVE
Urobilinogen, UA: 0.2 (ref 0.0–1.0)
pH: 7.5 (ref 5.0–8.0)

## 2021-12-10 LAB — SEDIMENTATION RATE: Sed Rate: 46 mm/h — ABNORMAL HIGH (ref 0–30)

## 2021-12-10 MED ORDER — KETOCONAZOLE 2 % EX CREA: 1.0000 | TOPICAL_CREAM | Freq: Two times a day (BID) | CUTANEOUS | 1 refills | Status: DC

## 2021-12-10 MED ORDER — METHYLPREDNISOLONE ACETATE 80 MG/ML IJ SUSP
80.0000 mg | Freq: Once | INTRAMUSCULAR | Status: AC
  Administered 2021-12-10: 80 mg via INTRAMUSCULAR

## 2021-12-10 MED ORDER — PREDNISONE 10 MG PO TABS: ORAL_TABLET | ORAL | 0 refills | Status: DC

## 2021-12-10 MED ORDER — AZITHROMYCIN 250 MG PO TABS: ORAL_TABLET | ORAL | 0 refills | Status: DC

## 2021-12-10 NOTE — Progress Notes (Signed)
Subjective:  Patient ID: [name], female    DOB: 1960-10-16  Age: 61 y.o. MRN: [id number]  CC: Follow-up (Red spots all over body )   HPI [name] presents for painful rash primarily afer a bout of bronchitis - treated on 11/20/2021. Pt was treated w/Augmentin. Rash developed after she finished abx. Rash is very painful x 2 weeks R eye w/red lower eyelid            Outpatient Medications Prior to Visit  Medication Sig Dispense Refill   acetaminophen (TYLENOL) 325 MG tablet Take 1-2 tablets (325-650 mg total) by mouth every 6 (six) hours as needed for mild pain (pain score 1-3 or temp > 100.5). 30 tablet 1   albuterol (VENTOLIN HFA) 108 (90 Base) MCG/ACT inhaler INHALE TWO puffs into THE lungs EVERY SIX HOURS AS NEEDED wheezind AND For SHORTNESS OF BREATH 6.7 g 3   B-D ULTRA-FINE 33 LANCETS MISC Use as instructed to test sugars once  daily 100 each 12   budesonide-formoterol (SYMBICORT) 160-4.5 MCG/ACT inhaler Inhale 2 puffs into the lungs TWICE DAILY 10.2 g 5   carvedilol (COREG) 12.5 MG tablet Take 1 tablet (12.5 mg total) by mouth 2 (two) times daily with a meal. 180 tablet 1   Cholecalciferol (VITAMIN D-3) 125 MCG (5000 UT) TABS Take 5,000 Units by mouth daily.     Cyanocobalamin (VITAMIN B-12) 1000 MCG SUBL Place 1 tablet (1,000 mcg total) under the tongue daily. (Patient taking differently: Place 1,000 mcg under the tongue daily.) 100 tablet 3   dexlansoprazole (DEXILANT) 60 MG capsule TAKE ONE CAPSULE BY MOUTH EVERY DAY 90 capsule 1   gabapentin (NEURONTIN) 300 MG capsule TAKE ONE CAPSULE BY MOUTH EVERY MORNING **May take a second capsule during THE DAY AS NEEDED FOR PAIN 180 capsule 0   glucose blood (ACCU-CHEK GUIDE) test strip Use to check blood sugar once a day. 100 each 12   HUMIRA PEN 40 MG/0.4ML PNKT Inject 40 mg as directed every Wednesday.      hydrOXYzine (ATARAX) 25 MG tablet Take 1 tablet (25 mg total) by mouth every 8 (eight)  hours as needed. 60 tablet 3   lidocaine (XYLOCAINE) 2 % solution Use as directed 15 mLs in the mouth or throat every 6 (six) hours as needed for mouth pain. 100 mL 0   metFORMIN (GLUCOPHAGE-XR) 500 MG 24 hr tablet Take 2 tablets (1,000 mg total) by mouth at bedtime. Annual appt due in July must see provider for future refills 180 tablet 0   methocarbamol (ROBAXIN) 500 MG tablet Take 1 tablet (500 mg total) by mouth every 6 (six) hours as needed for muscle spasms. 30 tablet 0   Multiple Vitamins-Minerals (MULTIVITAMIN WOMEN 50+) TABS Take 1 tablet by mouth daily.     naloxone (NARCAN) nasal spray 4 mg/0.1 mL Place 1 spray into the nose once as needed (overdose).     naloxone (NARCAN) nasal spray 4 mg/0.1 mL 1 actuation in one nostril once. May repeat in 2-3 min 1 each 2   naltrexone (DEPADE) 50 MG tablet Take 50 mg by mouth daily.     neomycin-polymyxin b-dexamethasone (MAXITROL) 3.5-10000-0.1 SUSP Place 1 drop into both eyes 4 (four) times daily as needed (eye irritation).     nystatin ointment (MYCOSTATIN) Apply 1 Application topically 2 (two) times daily. 100 g 0   OXYGEN Inhale 2 L into the lungs continuous.     polyvinyl alcohol (LIQUIFILM TEARS) 1.4 % ophthalmic solution Place  1 drop into both eyes as needed for dry eyes. 15 mL 0   potassium chloride (KLOR-CON M) 10 MEQ tablet Take 1 tablet (10 mEq total) by mouth daily. 90 tablet 0   QUEtiapine (SEROQUEL) 50 MG tablet Take 1 tab in am and 2 tab at HS 90 tablet 5   topiramate (TOPAMAX) 50 MG tablet Take 1 tablet (50 mg total) by mouth 2 (two) times daily. 180 tablet 1   trimethoprim-polymyxin b (POLYTRIM) ophthalmic solution Place 1 drop into both eyes 4 (four) times daily.     venlafaxine XR (EFFEXOR-XR) 75 MG 24 hr capsule Take 1 capsule (75 mg total) by mouth daily with breakfast. 30 capsule 11   XARELTO 20 MG TABS tablet TAKE ONE TABLET BY MOUTH EVERY DAY 90 tablet 0   benzonatate (TESSALON) 200 MG capsule Take 1 capsule (200 mg total) by  mouth 3 (three) times daily as needed. (Patient not taking: Reported on 11/28/2021) 60 capsule 0   oxyCODONE (OXYCONTIN) 10 mg 12 hr tablet Take 1 tablet (10 mg total) by mouth every 12 (twelve) hours. (Patient not taking: Reported on 11/28/2021) 60 tablet 0   No facility-administered medications prior to visit.    ROS: Review of Systems  Constitutional:  Negative for activity change, appetite change, chills, fatigue, fever and unexpected weight change.  HENT:  Positive for congestion and nosebleeds. Negative for mouth sores, sinus pressure and voice change.   Eyes:  Negative for visual disturbance.  Respiratory:  Positive for cough. Negative for chest tightness.   Gastrointestinal:  Negative for abdominal pain and nausea.  Genitourinary:  Negative for difficulty urinating, frequency, hematuria and vaginal pain.  Musculoskeletal:  Negative for back pain and gait problem.  Skin:  Positive for color change and rash. Negative for pallor.  Neurological:  Negative for dizziness, tremors, weakness, numbness and headaches.  Psychiatric/Behavioral:  Negative for confusion, sleep disturbance and suicidal ideas.     Objective:  BP 124/78 (BP Location: Left Arm, Patient Position: Sitting, Cuff Size: Normal)   Pulse 63   Temp 97.9 F (36.6 C) (Oral)   Ht '5\' 7"'$  (1.702 m)   Wt 257 lb (116.6 kg)   LMP 05/19/2003   SpO2 98%   BMI 40.25 kg/m   BP Readings from Last 3 Encounters:  12/10/21 124/78  11/28/21 124/68  11/20/21 (!) 140/86    Wt Readings from Last 3 Encounters:  12/10/21 257 lb (116.6 kg)  11/28/21 261 lb (118.4 kg)  11/20/21 263 lb (119.3 kg)    Physical Exam Constitutional:      General: She is not in acute distress.    Appearance: She is well-developed. She is obese. She is not toxic-appearing.  HENT:     Head: Normocephalic.     Right Ear: External ear normal.     Left Ear: External ear normal.     Nose: Nose normal.  Eyes:     General:        Right eye: No  discharge.        Left eye: No discharge.     Conjunctiva/sclera: Conjunctivae normal.     Pupils: Pupils are equal, round, and reactive to light.  Neck:     Thyroid: No thyromegaly.     Vascular: No JVD.     Trachea: No tracheal deviation.  Cardiovascular:     Rate and Rhythm: Normal rate and regular rhythm.     Heart sounds: Normal heart sounds.  Pulmonary:     Effort:  No respiratory distress.     Breath sounds: No stridor. No wheezing.  Abdominal:     General: Bowel sounds are normal. There is no distension.     Palpations: Abdomen is soft. There is no mass.     Tenderness: There is no abdominal tenderness. There is no guarding or rebound.  Musculoskeletal:        General: No tenderness.     Cervical back: Normal range of motion and neck supple. No rigidity.  Lymphadenopathy:     Cervical: No cervical adenopathy.  Skin:    Findings: Bruising and rash present. No erythema.  Neurological:     Mental Status: She is oriented to person, place, and time.     Cranial Nerves: No cranial nerve deficit.     Motor: Weakness present. No abnormal muscle tone.     Coordination: Coordination normal.     Gait: Gait abnormal.     Deep Tendon Reflexes: Reflexes normal.  Psychiatric:        Behavior: Behavior normal.        Thought Content: Thought content normal.        Judgment: Judgment normal.   Painful purpura type lesions - see above No HSM   A total time of 45 minutes was spent preparing to see the patient, reviewing tests, x-rays, operative reports and other medical records.  Also, obtaining history and performing comprehensive physical exam.  Additionally, counseling the patient regarding the above listed issues.   Finally, documenting clinical information in the health records, coordination of care, educating the patient re hemorrhagic rash.  It is a complex case.   Lab Results  Component Value Date   WBC 7.2 07/31/2021   HGB 12.3 07/31/2021   HCT 38.6 07/31/2021   PLT 198  07/31/2021   GLUCOSE 115 (H) 07/31/2021   CHOL 210 (H) 07/19/2020   TRIG 109.0 07/19/2020   HDL 77.20 07/19/2020   LDLDIRECT 107.0 06/30/2017   LDLCALC 111 (H) 07/19/2020   ALT 15 07/02/2021   AST 18 07/02/2021   NA 144 07/31/2021   K 3.9 07/31/2021   CL 107 07/31/2021   CREATININE 0.88 07/31/2021   BUN 22 07/31/2021   CO2 22 07/31/2021   TSH 1.18 07/02/2021   INR 1.00 02/11/2018   HGBA1C 5.1 03/21/2021   MICROALBUR <0.7 02/01/2015    DG Knee Left Port  Result Date: 07/31/2021 CLINICAL DATA:  Postop. EXAM: PORTABLE LEFT KNEE - 1-2 VIEW COMPARISON:  Preoperative radiographs. FINDINGS: Medial plate and multi screw fixation of periprosthetic distal femur fracture. 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 Chronic ossified densities in the region of the quadriceps and patellar tendons. There is a small knee joint effusion. IMPRESSION: Medial plate and screw fixation of periprosthetic distal femur fracture. No immediate postoperative complication. Electronically Signed   By: Keith Rake M.D.   On: 07/31/2021 19:52   DG Knee 1-2 Views Left  Result Date: 07/31/2021 CLINICAL DATA:  Medial left femoral condylar fracture, ORIF EXAM: LEFT KNEE - 1-2 VIEW COMPARISON:  07/25/2021 FINDINGS: 4 fluoroscopic images are obtained during the performance of the procedure and are provided for interpretation only. Lateral plate and screw fixation traverses the medial femoral condylar fracture, with near anatomic alignment. Please refer to operative report. Fluoroscopy time: 32 seconds, 4.08 mGy IMPRESSION: 1. ORIF medial femoral condylar fracture with near anatomic alignment. Electronically Signed   By: Randa Ngo M.D.   On: 07/31/2021 18:29   DG C-Arm 1-60 Min-No Report  Result Date:  07/31/2021 Fluoroscopy was utilized by the requesting physician.  No radiographic interpretation.    Assessment & Plan:   Problem List Items Addressed This Visit     Acute bronchitis    Refractory Start Z pac  in 2 days      B12 deficiency    On B12      Controlled type 2 diabetes mellitus with chronic kidney disease, without long-term current use of insulin ([place])    It may get worse on steroids. 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 Pt was treated w/Augmentin. Rash developed after she finished abx. Rash is very painful x 2 weeks  Start Steroids CBC      Relevant Orders   CBC with Differential/Platelet   Comprehensive metabolic panel   Sedimentation rate   Urinalysis   Rash      Meds ordered this encounter  Medications   predniSONE (DELTASONE) 10 MG tablet    Sig: Prednisone 10 mg: take 6 tabs a day x 4 days; then 5 tabs a day x 4 days; then 4 tabs a day until you see me. Take pc.    Dispense:  100 tablet    Refill:  0   azithromycin (ZITHROMAX Z-PAK) 250 MG tablet    Sig: As directed    Dispense:  6 tablet    Refill:  0   ketoconazole (NIZORAL) 2 % cream    Sig: Apply 1 Application topically 2 (two) times daily. Apply under breasts    Dispense:  45 g    Refill:  1   methylPREDNISolone acetate (DEPO-MEDROL) injection 80 mg      Follow-up: Return in about 10 days (around 12/20/2021) for a follow-up visit.  Walker Kehr, MD

## 2021-12-10 NOTE — Assessment & Plan Note (Addendum)
It may get worse on steroids. Take extra Metformin (3 per day)

## 2021-12-10 NOTE — Assessment & Plan Note (Signed)
New under breasts Ketoconazole bid

## 2021-12-10 NOTE — Assessment & Plan Note (Signed)
On B12 

## 2021-12-10 NOTE — Assessment & Plan Note (Addendum)
New painful rash primarily afer a bout of bronchitis - treated on 11/20/2021. Pt was treated w/Augmentin. Rash developed after she finished abx. Rash is very painful x 2 weeks  Start Steroids CBC

## 2021-12-10 NOTE — Assessment & Plan Note (Signed)
Refractory Start Z pac in 2 days

## 2021-12-12 MED FILL — HUMIRA PEN CITRATE FREE 40 MG/0.4 ML: SUBCUTANEOUS | 28 days supply | Qty: 4 | Fill #6

## 2021-12-20 ENCOUNTER — Ambulatory Visit: Payer: Medicare Other | Admitting: Internal Medicine

## 2021-12-20 ENCOUNTER — Other Ambulatory Visit: Payer: Self-pay | Admitting: Internal Medicine

## 2021-12-20 ENCOUNTER — Telehealth: Payer: Self-pay | Admitting: Internal Medicine

## 2021-12-20 NOTE — Telephone Encounter (Signed)
Patient came in wanting to know since she didn't see Dr. Camila Li today how is she suppose to go further with taking her medication predniSONE (DELTASONE) 10 MG tablet. Patient is currently taking 4 '10mg'$  ('40mg'$  total). She wants to know does she have to finish the whole bottle or change dosage. Best call back number is [phone].

## 2021-12-21 NOTE — Telephone Encounter (Signed)
Spoke w/ patient she states far as her legs she still have some pain, but looks like its lighting up. She will start the 30 mg prednisone, but they made her a f/u on 01/11/22 or does she need to be seen sooner.Marland KitchenJohny Chess

## 2021-12-21 NOTE — Telephone Encounter (Signed)
Patient called back and said she's not feeling any better

## 2021-12-21 NOTE — Telephone Encounter (Signed)
Is she better? If yes, okay to go to 30 mg daily.  See me in 1 week.  Thank you

## 2021-12-21 NOTE — Telephone Encounter (Signed)
Called pt no answer x's 10 rings.../lmb 

## 2021-12-23 NOTE — Telephone Encounter (Signed)
Office visit for 12/29 is okay, assuming she continues to improve.  Thank you

## 2021-12-24 DIAGNOSIS — H9041 Sensorineural hearing loss, unilateral, right ear, with unrestricted hearing on the contralateral side: Secondary | ICD-10-CM | POA: Diagnosis not present

## 2021-12-24 DIAGNOSIS — H918X2 Other specified hearing loss, left ear: Secondary | ICD-10-CM | POA: Diagnosis not present

## 2021-12-24 NOTE — Telephone Encounter (Signed)
Notified pt w/MD response.../lmb 

## 2022-01-01 DIAGNOSIS — H6522 Chronic serous otitis media, left ear: Secondary | ICD-10-CM | POA: Diagnosis not present

## 2022-01-03 NOTE — Progress Notes (Signed)
  Care Coordination Note  01/03/2022 Name: [name] MRN: [id number] DOB: March 03, 1960  [name] is a 61 y.o. year old female who is a primary care patient of Plotnikov, Evie Lacks, MD and is actively engaged with the care management team. I reached out to [place] by phone today to assist with re-scheduling a follow up visit with the RN Case Manager  Follow up plan: We have been unable to make contact with the patient for follow up.  Julian Hy, [place] Direct Dial: [phone]

## 2022-01-04 NOTE — Unmapped (Signed)
[facility] Refill Coordination Note    Specialty Medication(s) to be Shipped:   Inflammatory Disorders: Humira    Other medication(s) to be shipped: No additional medications requested for fill at this time     Carolyn Newton, DOB: 01/18/1960  Phone: [phone] (home)       All above HIPAA information was verified with patient.     Was a Nurse, learning disability used for this call? No    Completed refill call assessment today to schedule patient's medication shipment from the [facility] [phone]).  All relevant notes have been reviewed.     Specialty medication(s) and dose(s) confirmed: Regimen is correct and unchanged.   Changes to medications: [name] reports no changes at this time.  Changes to insurance: No  New side effects reported not previously addressed with a pharmacist or physician: None reported  Questions for the pharmacist: No    Confirmed patient received a Conservation officer, historic buildings and a Surveyor, mining with first shipment. The patient will receive a drug information handout for each medication shipped and additional FDA Medication Guides as required.       DISEASE/MEDICATION-SPECIFIC INFORMATION        For patients on injectable medications: Patient currently has 1 doses left.  Next injection is scheduled for 01/09/22.    SPECIALTY MEDICATION ADHERENCE     Medication Adherence    Patient reported X missed doses in the last month: 0  Specialty Medication: HUMIRA(CF) PEN 40 mg/0.4 mL  Patient is on additional specialty medications: No  Patient is on more than two specialty medications: No  Any gaps in refill history greater than 2 weeks in the last 3 months: no  Demonstrates understanding of importance of adherence: yes                          Were doses missed due to medication being on hold? No    humira 40/0.4 mg/ml: 7 days of medicine on hand       REFERRAL TO PHARMACIST     Referral to the pharmacist: Not needed      [facility]     Shipping address confirmed in Epic.     Delivery Scheduled: Yes, Expected medication delivery date: 01/10/22.     Medication will be delivered via UPS to the prescription address in Epic WAM.    Ricci Barker   [facility] Specialty Technician

## 2022-01-06 DIAGNOSIS — J449 Chronic obstructive pulmonary disease, unspecified: Secondary | ICD-10-CM | POA: Diagnosis not present

## 2022-01-08 ENCOUNTER — Other Ambulatory Visit: Payer: Self-pay | Admitting: Internal Medicine

## 2022-01-09 MED FILL — HUMIRA PEN CITRATE FREE 40 MG/0.4 ML: SUBCUTANEOUS | 28 days supply | Qty: 4 | Fill #7

## 2022-01-11 ENCOUNTER — Encounter: Payer: Self-pay | Admitting: Internal Medicine

## 2022-01-11 ENCOUNTER — Ambulatory Visit (INDEPENDENT_AMBULATORY_CARE_PROVIDER_SITE_OTHER): Payer: Medicare Other | Admitting: Internal Medicine

## 2022-01-11 VITALS — BP 136/80 | HR 73 | Temp 98.0°F | Ht 67.0 in | Wt 259.0 lb

## 2022-01-11 DIAGNOSIS — D869 Sarcoidosis, unspecified: Secondary | ICD-10-CM

## 2022-01-11 DIAGNOSIS — D690 Allergic purpura: Secondary | ICD-10-CM

## 2022-01-11 DIAGNOSIS — I2782 Chronic pulmonary embolism: Secondary | ICD-10-CM

## 2022-01-11 MED ORDER — PREDNISONE 10 MG PO TABS: ORAL_TABLET | ORAL | 0 refills | Status: DC

## 2022-01-11 MED ORDER — AZITHROMYCIN 250 MG PO TABS: ORAL_TABLET | ORAL | 0 refills | Status: DC

## 2022-01-11 NOTE — Assessment & Plan Note (Signed)
Wt Readings from Last 3 Encounters:  01/11/22 259 lb (117.5 kg)  12/10/21 257 lb (116.6 kg)  11/28/21 261 lb (118.4 kg)

## 2022-01-11 NOTE — Assessment & Plan Note (Signed)
Cont on Xarelto  

## 2022-01-11 NOTE — Progress Notes (Signed)
Subjective:  Patient ID: [name], female    DOB: 1960-04-09  Age: 61 y.o. MRN: [id number]  CC: Follow-up (Spots Has not gone away)   HPI [name] presents for purpura rash - no new ones C/o painful swelling in the LLQ  Outpatient Medications Prior to Visit  Medication Sig Dispense Refill   acetaminophen (TYLENOL) 325 MG tablet Take 1-2 tablets (325-650 mg total) by mouth every 6 (six) hours as needed for mild pain (pain score 1-3 or temp > 100.5). 30 tablet 1   albuterol (VENTOLIN HFA) 108 (90 Base) MCG/ACT inhaler INHALE TWO puffs into THE lungs EVERY SIX HOURS AS NEEDED wheezind AND For SHORTNESS OF BREATH 6.7 g 3   B-D ULTRA-FINE 33 LANCETS MISC Use as instructed to test sugars once  daily 100 each 12   budesonide-formoterol (SYMBICORT) 160-4.5 MCG/ACT inhaler Inhale 2 puffs into the lungs TWICE DAILY 10.2 g 5   carvedilol (COREG) 12.5 MG tablet Take 1 tablet (12.5 mg total) by mouth 2 (two) times daily with a meal. 180 tablet 1   Cholecalciferol (VITAMIN D-3) 125 MCG (5000 UT) TABS Take 5,000 Units by mouth daily.     Cyanocobalamin (VITAMIN B-12) 1000 MCG SUBL Place 1 tablet (1,000 mcg total) under the tongue daily. (Patient taking differently: Place 1,000 mcg under the tongue daily.) 100 tablet 3   dexlansoprazole (DEXILANT) 60 MG capsule TAKE ONE CAPSULE BY MOUTH EVERY DAY 90 capsule 1   Docusate Sodium (DSS) 100 MG CAPS Take by mouth.     gabapentin (NEURONTIN) 300 MG capsule TAKE ONE CAPSULE BY MOUTH EVERY MORNING **May take a second capsule during THE DAY AS NEEDED FOR PAIN 180 capsule 0   glucose blood (ACCU-CHEK GUIDE) test strip Use to check blood sugar once a day. 100 each 12   HUMIRA PEN 40 MG/0.4ML PNKT Inject 40 mg as directed every Wednesday.      hydrOXYzine (ATARAX) 25 MG tablet Take 1 tablet (25 mg total) by mouth every 8 (eight) hours as needed. 60 tablet 3   ketoconazole (NIZORAL) 2 % cream Apply 1 Application topically 2 (two)  times daily. Apply under breasts 45 g 1   lidocaine (XYLOCAINE) 2 % solution Use as directed 15 mLs in the mouth or throat every 6 (six) hours as needed for mouth pain. 100 mL 0   metFORMIN (GLUCOPHAGE-XR) 500 MG 24 hr tablet Take 2 tablets (1,000 mg total) by mouth at bedtime. Annual appt due in July must see provider for future refills 180 tablet 0   Multiple Vitamins-Minerals (MULTIVITAMIN WOMEN 50+) TABS Take 1 tablet by mouth daily.     naloxone (NARCAN) nasal spray 4 mg/0.1 mL Place 1 spray into the nose once as needed (overdose).     naloxone (NARCAN) nasal spray 4 mg/0.1 mL 1 actuation in one nostril once. May repeat in 2-3 min 1 each 2   naltrexone (DEPADE) 50 MG tablet Take 50 mg by mouth daily.     neomycin-polymyxin b-dexamethasone (MAXITROL) 3.5-10000-0.1 SUSP Place 1 drop into both eyes 4 (four) times daily as needed (eye irritation).     nystatin ointment (MYCOSTATIN) Apply 1 Application topically 2 (two) times daily. 100 g 0   OXYGEN Inhale 2 L into the lungs continuous.     potassium chloride (KLOR-CON M) 10 MEQ tablet Take 1 tablet (10 mEq total) by mouth daily. 90 tablet 0   QUEtiapine (SEROQUEL) 50 MG tablet Take 1 tab in am and 2 tab at  HS 90 tablet 5   topiramate (TOPAMAX) 50 MG tablet Take 1 tablet (50 mg total) by mouth 2 (two) times daily. 180 tablet 1   trimethoprim-polymyxin b (POLYTRIM) ophthalmic solution Place 1 drop into both eyes 4 (four) times daily.     venlafaxine XR (EFFEXOR-XR) 75 MG 24 hr capsule Take 1 capsule (75 mg total) by mouth daily with breakfast. 30 capsule 11   XARELTO 20 MG TABS tablet TAKE ONE TABLET BY MOUTH EVERY DAY 90 tablet 0   azithromycin (ZITHROMAX Z-PAK) 250 MG tablet As directed 6 tablet 0   benzonatate (TESSALON) 200 MG capsule Take 1 capsule (200 mg total) by mouth 3 (three) times daily as needed. (Patient not taking: Reported on 11/28/2021) 60 capsule 0   methocarbamol (ROBAXIN) 500 MG tablet Take 1 tablet (500 mg total) by mouth every  6 (six) hours as needed for muscle spasms. (Patient not taking: Reported on 01/11/2022) 30 tablet 0   ofloxacin (FLOXIN) 0.3 % OTIC solution SMARTSIG:Left Ear (Patient not taking: Reported on 01/11/2022)     oxyCODONE (OXYCONTIN) 10 mg 12 hr tablet Take 1 tablet (10 mg total) by mouth every 12 (twelve) hours. (Patient not taking: Reported on 11/28/2021) 60 tablet 0   polyvinyl alcohol (LIQUIFILM TEARS) 1.4 % ophthalmic solution Place 1 drop into both eyes as needed for dry eyes. (Patient not taking: Reported on 01/11/2022) 15 mL 0   predniSONE (DELTASONE) 10 MG tablet Prednisone 10 mg: take 6 tabs a day x 4 days; then 5 tabs a day x 4 days; then 4 tabs a day until you see me. Take pc. (Patient not taking: Reported on 01/11/2022) 100 tablet 0   No facility-administered medications prior to visit.    ROS: Review of Systems  Constitutional:  Negative for activity change, appetite change, chills, fatigue and unexpected weight change.  HENT:  Negative for congestion, mouth sores and sinus pressure.   Eyes:  Negative for visual disturbance.  Respiratory:  Negative for cough and chest tightness.   Gastrointestinal:  Negative for abdominal pain and nausea.  Genitourinary:  Negative for difficulty urinating, frequency and vaginal pain.  Musculoskeletal:  Positive for arthralgias and gait problem. Negative for back pain.  Skin:  Positive for color change and rash. Negative for pallor.  Neurological:  Negative for dizziness, tremors, weakness, numbness and headaches.  Psychiatric/Behavioral:  Positive for dysphoric mood. Negative for confusion, sleep disturbance and suicidal ideas. The patient is nervous/anxious.     Objective:  BP 136/80 (BP Location: Left Arm, Patient Position: Sitting, Cuff Size: Normal)   Pulse 73   Temp 98 F (36.7 C) (Oral)   Ht '5\' 7"'$  (1.702 m)   Wt 259 lb (117.5 kg)   LMP 05/19/2003   SpO2 100%   BMI 40.57 kg/m   BP Readings from Last 3 Encounters:  01/11/22 136/80   12/10/21 124/78  11/28/21 124/68    Wt Readings from Last 3 Encounters:  01/11/22 259 lb (117.5 kg)  12/10/21 257 lb (116.6 kg)  11/28/21 261 lb (118.4 kg)    Physical Exam Constitutional:      General: She is not in acute distress.    Appearance: She is well-developed. She is obese.  HENT:     Head: Normocephalic.     Right Ear: External ear normal.     Left Ear: External ear normal.     Nose: Nose normal.  Eyes:     General:        Right eye:  No discharge.        Left eye: No discharge.     Conjunctiva/sclera: Conjunctivae normal.     Pupils: Pupils are equal, round, and reactive to light.  Neck:     Thyroid: No thyromegaly.     Vascular: No JVD.     Trachea: No tracheal deviation.  Cardiovascular:     Rate and Rhythm: Normal rate and regular rhythm.     Heart sounds: Normal heart sounds.  Pulmonary:     Effort: No respiratory distress.     Breath sounds: No stridor. No wheezing.  Abdominal:     General: Bowel sounds are normal. There is no distension.     Palpations: Abdomen is soft. There is no mass.     Tenderness: There is no abdominal tenderness. There is no guarding or rebound.  Musculoskeletal:        General: No tenderness.     Cervical back: Normal range of motion and neck supple. No rigidity.  Lymphadenopathy:     Cervical: No cervical adenopathy.  Skin:    Findings: Bruising and erythema present. No rash.  Neurological:     Mental Status: She is oriented to person, place, and time.     Cranial Nerves: No cranial nerve deficit.     Motor: No abnormal muscle tone.     Coordination: Coordination normal.     Deep Tendon Reflexes: Reflexes normal.  Psychiatric:        Behavior: Behavior normal.        Thought Content: Thought content normal.        Judgment: Judgment normal.   Leg rash - lighter lesions, no new ones on LEs Boil on LLQ skin 1 cm  Lab Results  Component Value Date   WBC 7.1 12/10/2021   HGB 12.7 12/10/2021   HCT 38.3 12/10/2021    PLT 214.0 12/10/2021   GLUCOSE 104 (H) 12/10/2021   CHOL 210 (H) 07/19/2020   TRIG 109.0 07/19/2020   HDL 77.20 07/19/2020   LDLDIRECT 107.0 06/30/2017   LDLCALC 111 (H) 07/19/2020   ALT 13 12/10/2021   AST 19 12/10/2021   NA 137 12/10/2021   K 3.9 12/10/2021   CL 103 12/10/2021   CREATININE 0.94 12/10/2021   BUN 13 12/10/2021   CO2 28 12/10/2021   TSH 1.18 07/02/2021   INR 1.00 02/11/2018   HGBA1C 5.1 03/21/2021   MICROALBUR <0.7 02/01/2015    DG Knee Left Port  Result Date: 07/31/2021 CLINICAL DATA:  Postop. EXAM: PORTABLE LEFT KNEE - 1-2 VIEW COMPARISON:  Preoperative radiographs. FINDINGS: Medial plate and multi screw fixation of periprosthetic distal femur fracture. 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 Chronic ossified densities in the region of the quadriceps and patellar tendons. There is a small knee joint effusion. IMPRESSION: Medial plate and screw fixation of periprosthetic distal femur fracture. No immediate postoperative complication. Electronically Signed   By: Keith Rake M.D.   On: 07/31/2021 19:52   DG Knee 1-2 Views Left  Result Date: 07/31/2021 CLINICAL DATA:  Medial left femoral condylar fracture, ORIF EXAM: LEFT KNEE - 1-2 VIEW COMPARISON:  07/25/2021 FINDINGS: 4 fluoroscopic images are obtained during the performance of the procedure and are provided for interpretation only. Lateral plate and screw fixation traverses the medial femoral condylar fracture, with near anatomic alignment. Please refer to operative report. Fluoroscopy time: 32 seconds, 4.08 mGy IMPRESSION: 1. ORIF medial femoral condylar fracture with near anatomic alignment. Electronically Signed   By: Randa Ngo  M.D.   On: 07/31/2021 18:29   DG C-Arm 1-60 Min-No Report  Result Date: 07/31/2021 Fluoroscopy was utilized by the requesting physician.  No radiographic interpretation.    Assessment & Plan:   Problem List Items Addressed This Visit     Sarcoidosis - Primary    On  Steroids now      Purpura, allergic ([place])    Resolving -- off steroids x3d (ran out) Re-start Deltasone at 20 mg x 2 wks, then 10 mg x 2 wks, then 5 mg x 2 wks, then stop      Pulmonary embolism ([place])    Cont on Xarelto      Obesity, Class III, BMI 40-49.9 (morbid obesity) ([place])    Wt Readings from Last 3 Encounters:  01/11/22 259 lb (117.5 kg)  12/10/21 257 lb (116.6 kg)  11/28/21 261 lb (118.4 kg)           Meds ordered this encounter  Medications   DISCONTD: predniSONE (DELTASONE) 10 MG tablet    Sig: Take 20 mg/d x 2 wks, then 10 mg/d x 2 wks, then 5 mg/d x 2 wks, then stop    Dispense:  60 tablet    Refill:  0   DISCONTD: azithromycin (ZITHROMAX Z-PAK) 250 MG tablet    Sig: As directed    Dispense:  6 tablet    Refill:  0   azithromycin (ZITHROMAX Z-PAK) 250 MG tablet    Sig: As directed    Dispense:  6 tablet    Refill:  0   predniSONE (DELTASONE) 10 MG tablet    Sig: Take 20 mg/d x 2 wks, then 10 mg/d x 2 wks, then 5 mg/d x 2 wks, then stop    Dispense:  60 tablet    Refill:  0      Follow-up: Return in about 2 months (around 03/14/2022) for a follow-up visit.  Walker Kehr, MD

## 2022-01-11 NOTE — Assessment & Plan Note (Addendum)
Resolving -- off steroids x3d (ran out) Re-start Deltasone at 20 mg x 2 wks, then 10 mg x 2 wks, then 5 mg x 2 wks, then stop

## 2022-01-11 NOTE — Assessment & Plan Note (Signed)
On Steroids now

## 2022-01-18 ENCOUNTER — Other Ambulatory Visit: Payer: Self-pay | Admitting: Internal Medicine

## 2022-01-23 DIAGNOSIS — H90A32 Mixed conductive and sensorineural hearing loss, unilateral, left ear with restricted hearing on the contralateral side: Secondary | ICD-10-CM | POA: Diagnosis not present

## 2022-01-23 DIAGNOSIS — H6522 Chronic serous otitis media, left ear: Secondary | ICD-10-CM | POA: Diagnosis not present

## 2022-01-23 DIAGNOSIS — M26622 Arthralgia of left temporomandibular joint: Secondary | ICD-10-CM | POA: Diagnosis not present

## 2022-01-23 DIAGNOSIS — Z9622 Myringotomy tube(s) status: Secondary | ICD-10-CM | POA: Diagnosis not present

## 2022-01-24 DIAGNOSIS — H04203 Unspecified epiphora, bilateral lacrimal glands: Secondary | ICD-10-CM | POA: Diagnosis not present

## 2022-01-24 DIAGNOSIS — M26622 Arthralgia of left temporomandibular joint: Secondary | ICD-10-CM | POA: Insufficient documentation

## 2022-01-30 ENCOUNTER — Ambulatory Visit: Payer: 59

## 2022-01-31 ENCOUNTER — Other Ambulatory Visit: Payer: Self-pay | Admitting: Internal Medicine

## 2022-02-01 NOTE — Unmapped (Signed)
[facility] Refill Coordination Note    Specialty Medication(s) to be Shipped:   Inflammatory Disorders: Humira    Other medication(s) to be shipped: No additional medications requested for fill at this time     Carolyn Newton, DOB: May 15, 1960  Phone: [phone] (home)       All above HIPAA information was verified with patient.     Was a Nurse, learning disability used for this call? No    Completed refill call assessment today to schedule patient's medication shipment from the [facility] [phone]).  All relevant notes have been reviewed.     Specialty medication(s) and dose(s) confirmed: Regimen is correct and unchanged.   Changes to medications: Carolyn Newton reports no changes at this time.  Changes to insurance: No  New side effects reported not previously addressed with a pharmacist or physician: None reported  Questions for the pharmacist: No    Confirmed patient received a Conservation officer, historic buildings and a Surveyor, mining with first shipment. The patient will receive a drug information handout for each medication shipped and additional FDA Medication Guides as required.       DISEASE/MEDICATION-SPECIFIC INFORMATION        For patients on injectable medications: Patient currently has 2 doses left.  Next injection is scheduled for 02/06/22.    SPECIALTY MEDICATION ADHERENCE     Medication Adherence    Patient reported X missed doses in the last month: 0  Specialty Medication: HUMIRA(CF) PEN 40 mg/0.4 mL  Patient is on additional specialty medications: No                                Were doses missed due to medication being on hold? No    Humira 40/0.4 mg/ml:  12 days of medicine on hand        REFERRAL TO PHARMACIST     Referral to the pharmacist: Not needed      [facility]     Shipping address confirmed in Epic.     Delivery Scheduled: Yes, Expected medication delivery date: 02/08/22.     Medication will be delivered via UPS to the prescription address in Epic WAM.    Unk Lightning   [facility] Specialty Technician

## 2022-02-06 DIAGNOSIS — J449 Chronic obstructive pulmonary disease, unspecified: Secondary | ICD-10-CM | POA: Diagnosis not present

## 2022-02-07 MED FILL — HUMIRA PEN CITRATE FREE 40 MG/0.4 ML: SUBCUTANEOUS | 28 days supply | Qty: 4 | Fill #8

## 2022-02-19 ENCOUNTER — Other Ambulatory Visit: Payer: Self-pay | Admitting: Internal Medicine

## 2022-02-26 ENCOUNTER — Other Ambulatory Visit: Payer: Self-pay | Admitting: Adult Health

## 2022-03-04 NOTE — Unmapped (Signed)
[facility] Refill Coordination Note    Specialty Medication(s) to be Shipped:   Inflammatory Disorders: Cosentyx    Other medication(s) to be shipped: No additional medications requested for fill at this time     Carolyn Newton, DOB: 1960/12/10  Phone: [phone] (home)       All above HIPAA information was verified with patient.     Was a Nurse, learning disability used for this call? No    Completed refill call assessment today to schedule patient's medication shipment from the [facility] [phone]).  All relevant notes have been reviewed.     Specialty medication(s) and dose(s) confirmed: Regimen is correct and unchanged.   Changes to medications: Carolyn Newton reports no changes at this time.  Changes to insurance: No  New side effects reported not previously addressed with a pharmacist or physician: None reported  Questions for the pharmacist: No    Confirmed patient received a Conservation officer, historic buildings and a Surveyor, mining with first shipment. The patient will receive a drug information handout for each medication shipped and additional FDA Medication Guides as required.       DISEASE/MEDICATION-SPECIFIC INFORMATION        For patients on injectable medications: Patient currently has 2 doses left.  Next injection is scheduled for 2/21, 2/28.    SPECIALTY MEDICATION ADHERENCE     Medication Adherence    Patient reported X missed doses in the last month: 0  Specialty Medication: Humira  Patient is on additional specialty medications: No  Informant: patient              Were doses missed due to medication being on hold? No    Humira 40/0.4 mg/ml: 9 days of medicine on hand       REFERRAL TO PHARMACIST     Referral to the pharmacist: Not needed      [name]     Shipping address confirmed in Epic.     Patient was notified of new phone menu : Yes: .    Delivery Scheduled: Yes, Expected medication delivery date: 2/23.     Medication will be delivered via UPS to the prescription address in Epic WAM.    Carolyn Newton   [facility] Specialty Technician

## 2022-03-07 MED FILL — HUMIRA PEN CITRATE FREE 40 MG/0.4 ML: SUBCUTANEOUS | 28 days supply | Qty: 4 | Fill #9

## 2022-03-09 ENCOUNTER — Other Ambulatory Visit: Payer: Self-pay | Admitting: Internal Medicine

## 2022-03-09 DIAGNOSIS — J449 Chronic obstructive pulmonary disease, unspecified: Secondary | ICD-10-CM | POA: Diagnosis not present

## 2022-03-12 ENCOUNTER — Encounter: Payer: Self-pay | Admitting: Internal Medicine

## 2022-03-12 ENCOUNTER — Ambulatory Visit (INDEPENDENT_AMBULATORY_CARE_PROVIDER_SITE_OTHER): Payer: 59 | Admitting: Internal Medicine

## 2022-03-12 VITALS — BP 122/76 | HR 73 | Temp 98.3°F | Ht 67.0 in | Wt 261.0 lb

## 2022-03-12 DIAGNOSIS — G894 Chronic pain syndrome: Secondary | ICD-10-CM | POA: Diagnosis not present

## 2022-03-12 DIAGNOSIS — H5789 Other specified disorders of eye and adnexa: Secondary | ICD-10-CM | POA: Insufficient documentation

## 2022-03-12 DIAGNOSIS — E538 Deficiency of other specified B group vitamins: Secondary | ICD-10-CM | POA: Diagnosis not present

## 2022-03-12 DIAGNOSIS — H543 Unqualified visual loss, both eyes: Secondary | ICD-10-CM | POA: Insufficient documentation

## 2022-03-12 DIAGNOSIS — D690 Allergic purpura: Secondary | ICD-10-CM | POA: Diagnosis not present

## 2022-03-12 MED ORDER — OXYCODONE HCL ER 10 MG PO T12A: 10.0000 mg | EXTENDED_RELEASE_TABLET | Freq: Two times a day (BID) | ORAL | 0 refills | Status: DC

## 2022-03-12 MED ORDER — METHYLPREDNISOLONE 4 MG PO TBPK: ORAL_TABLET | ORAL | 0 refills | Status: DC

## 2022-03-12 MED ORDER — KETOROLAC TROMETHAMINE 60 MG/2ML IM SOLN
60.0000 mg | Freq: Once | INTRAMUSCULAR | Status: AC
  Administered 2022-03-12: 60 mg via INTRAMUSCULAR

## 2022-03-12 MED ORDER — ERYTHROMYCIN 5 MG/GM OP OINT: 1.0000 | TOPICAL_OINTMENT | Freq: Four times a day (QID) | OPHTHALMIC | 1 refills | Status: DC

## 2022-03-12 MED ORDER — TOBRAMYCIN-DEXAMETHASONE 0.3-0.05 % OP SUSP: 3.0000 [drp] | Freq: Three times a day (TID) | OPHTHALMIC | 1 refills | Status: DC

## 2022-03-12 MED ORDER — METHYLPREDNISOLONE ACETATE 80 MG/ML IJ SUSP
80.0000 mg | Freq: Once | INTRAMUSCULAR | Status: AC
  Administered 2022-03-12: 80 mg via INTRAMUSCULAR

## 2022-03-12 NOTE — Assessment & Plan Note (Signed)
Pt was ref to Dr Delia Chimes, [facility]  B red yes, pain, loss of vision

## 2022-03-12 NOTE — Assessment & Plan Note (Signed)
Worse  Medrol pack Re-start Oxy 10 mg bid

## 2022-03-12 NOTE — Assessment & Plan Note (Signed)
Much better 

## 2022-03-12 NOTE — Assessment & Plan Note (Signed)
On B12 

## 2022-03-12 NOTE — Progress Notes (Signed)
Subjective:  Patient ID: [name], female    DOB: [birth year]  Age: 62 y.o. MRN: [id number]  CC: Follow-up (Both eyes are red, last Tuesday was told she had pink eye and no medication prescribed )   HPI [name] presents for pink eye dx'ed 1 month ago. Her eye doctor saw her and told her to use OTC  Refresh. She was supposed to f/u 1 wk ago, Dr Hollice Espy could not see her on time, Ta[place]'s ride had to leave... Getting worse...   B eyes red , pain, loss of vision       Outpatient Medications Prior to Visit  Medication Sig Dispense Refill   acetaminophen (TYLENOL) 325 MG tablet Take 1-2 tablets (325-650 mg total) by mouth every 6 (six) hours as needed for mild pain (pain score 1-3 or temp > 100.5). 30 tablet 1   albuterol (VENTOLIN HFA) 108 (90 Base) MCG/ACT inhaler INHALE TWO puffs into THE lungs EVERY SIX HOURS AS NEEDED wheezind AND For SHORTNESS OF BREATH 6.7 g 3   azithromycin (ZITHROMAX Z-PAK) 250 MG tablet As directed 6 tablet 0   B-D ULTRA-FINE 33 LANCETS MISC Use as instructed to test sugars once  daily 100 each 12   benzonatate (TESSALON) 200 MG capsule Take 1 capsule (200 mg total) by mouth 3 (three) times daily as needed. 60 capsule 0   budesonide-formoterol (SYMBICORT) 160-4.5 MCG/ACT inhaler Inhale 2 puffs into the lungs TWICE DAILY 10.2 g 5   carvedilol (COREG) 12.5 MG tablet Take 1 tablet (12.5 mg total) by mouth 2 (two) times daily with a meal. 180 tablet 1   Cholecalciferol (VITAMIN D-3) 125 MCG (5000 UT) TABS Take 5,000 Units by mouth daily.     Cyanocobalamin (VITAMIN B-12) 1000 MCG SUBL Place 1 tablet (1,000 mcg total) under the tongue daily. (Patient taking differently: Place 1,000 mcg under the tongue daily.) 100 tablet 3   dexlansoprazole (DEXILANT) 60 MG capsule TAKE ONE CAPSULE BY MOUTH EVERY DAY 90 capsule 1   Docusate Sodium (DSS) 100 MG CAPS Take by mouth.     gabapentin (NEURONTIN) 300 MG capsule TAKE ONE CAPSULE BY MOUTH  EVERY MORNING **May take a second capsule during THE DAY AS NEEDED FOR PAIN 180 capsule 0   glucose blood (ACCU-CHEK GUIDE) test strip Use to check blood sugar once a day. 100 each 12   HUMIRA PEN 40 MG/0.4ML PNKT Inject 40 mg as directed every Wednesday.      hydrOXYzine (ATARAX) 25 MG tablet Take 1 tablet (25 mg total) by mouth every 8 (eight) hours as needed. 60 tablet 3   ketoconazole (NIZORAL) 2 % cream Apply 1 Application topically 2 (two) times daily. Apply under breasts 45 g 1   lidocaine (XYLOCAINE) 2 % solution Use as directed 15 mLs in the mouth or throat every 6 (six) hours as needed for mouth pain. 100 mL 0   metFORMIN (GLUCOPHAGE-XR) 500 MG 24 hr tablet Take 2 tablets (1,000 mg total) by mouth at bedtime. Annual appt due in July must see provider for future refills 180 tablet 0   methocarbamol (ROBAXIN) 500 MG tablet Take 1 tablet (500 mg total) by mouth every 6 (six) hours as needed for muscle spasms. 30 tablet 0   Multiple Vitamins-Minerals (MULTIVITAMIN WOMEN 50+) TABS Take 1 tablet by mouth daily.     naloxone (NARCAN) nasal spray 4 mg/0.1 mL Place 1 spray into the nose once as needed (overdose).     naloxone [facility]) nasal  spray 4 mg/0.1 mL 1 actuation in one nostril once. May repeat in 2-3 min 1 each 2   naltrexone (DEPADE) 50 MG tablet Take 50 mg by mouth daily.     neomycin-polymyxin b-dexamethasone (MAXITROL) 3.5-10000-0.1 SUSP Place 1 drop into both eyes 4 (four) times daily as needed (eye irritation).     nystatin ointment (MYCOSTATIN) Apply 1 Application topically 2 (two) times daily. 100 g 0   ofloxacin (FLOXIN) 0.3 % OTIC solution      OXYGEN Inhale 2 L into the lungs continuous.     polyvinyl alcohol (LIQUIFILM TEARS) 1.4 % ophthalmic solution Place 1 drop into both eyes as needed for dry eyes. 15 mL 0   potassium chloride (KLOR-CON M) 10 MEQ tablet TAKE ONE TABLET BY MOUTH EVERY DAY 90 tablet 1   predniSONE (DELTASONE) 10 MG tablet Take 20 mg/d x 2 wks, then 10 mg/d x  2 wks, then 5 mg/d x 2 wks, then stop 60 tablet 0   QUEtiapine (SEROQUEL) 50 MG tablet Take 1 tab in am and 2 tab at bedtime 90 tablet 5   rivaroxaban (XARELTO) 20 MG TABS tablet TAKE ONE TABLET BY MOUTH EVERY DAY 90 tablet 3   topiramate (TOPAMAX) 50 MG tablet TAKE ONE TABLET BY MOUTH TWICE DAILY 180 tablet 1   trimethoprim-polymyxin b (POLYTRIM) ophthalmic solution Place 1 drop into both eyes 4 (four) times daily.     venlafaxine XR (EFFEXOR-XR) 75 MG 24 hr capsule Take 1 capsule (75 mg total) by mouth daily with breakfast. 30 capsule 11   oxyCODONE (OXYCONTIN) 10 mg 12 hr tablet Take 1 tablet (10 mg total) by mouth every 12 (twelve) hours. 60 tablet 0   No facility-administered medications prior to visit.    ROS: Review of Systems  Constitutional:  Positive for fatigue and unexpected weight change. Negative for activity change, appetite change and chills.  HENT:  Positive for congestion. Negative for mouth sores and sinus pressure.   Eyes:  Positive for photophobia, pain, discharge, redness, itching and visual disturbance.  Respiratory:  Negative for cough and chest tightness.   Cardiovascular:  Positive for leg swelling.  Gastrointestinal:  Negative for abdominal pain and nausea.  Genitourinary:  Negative for difficulty urinating, frequency and vaginal pain.  Musculoskeletal:  Positive for arthralgias, back pain, gait problem and myalgias.  Skin:  Negative for pallor and rash.  Neurological:  Negative for dizziness, tremors, weakness, numbness and headaches.  Hematological:  Bruises/bleeds easily.  Psychiatric/Behavioral:  Positive for dysphoric mood and sleep disturbance. Negative for confusion and suicidal ideas. The patient is nervous/anxious.     Objective:  BP 122/76 (BP Location: Left Arm, Patient Position: Sitting, Cuff Size: Normal)   Pulse 73   Temp 98.3 F (36.8 C) (Oral)   Ht '5\' 7"'$  (1.702 m)   Wt 261 lb (118.4 kg)   LMP 05/19/2003   SpO2 98%   BMI 40.88 kg/m   BP  Readings from Last 3 Encounters:  03/12/22 122/76  01/11/22 136/80  12/10/21 124/78    Wt Readings from Last 3 Encounters:  03/12/22 261 lb (118.4 kg)  01/11/22 259 lb (117.5 kg)  12/10/21 257 lb (116.6 kg)    Physical Exam Constitutional:      General: She is not in acute distress.    Appearance: She is well-developed. She is obese.  HENT:     Head: Normocephalic.     Right Ear: External ear normal.     Left Ear: External ear normal.  Nose: Nose normal.  Eyes:     General:        Right eye: Discharge present.        Left eye: Discharge present. Neck:     Thyroid: No thyromegaly.     Vascular: No JVD.     Trachea: No tracheal deviation.  Cardiovascular:     Rate and Rhythm: Normal rate and regular rhythm.     Heart sounds: Normal heart sounds.  Pulmonary:     Effort: No respiratory distress.     Breath sounds: No stridor. No wheezing.  Abdominal:     General: Bowel sounds are normal. There is no distension.     Palpations: Abdomen is soft. There is no mass.     Tenderness: There is no abdominal tenderness. There is no guarding or rebound.  Musculoskeletal:        General: Tenderness present.     Cervical back: Normal range of motion and neck supple. No rigidity.     Right lower leg: No edema.     Left lower leg: No edema.  Lymphadenopathy:     Cervical: No cervical adenopathy.  Skin:    Findings: No erythema or rash.  Neurological:     Mental Status: She is oriented to person, place, and time.     Cranial Nerves: No cranial nerve deficit.     Motor: Weakness present. No abnormal muscle tone.     Coordination: Coordination abnormal.     Gait: Gait abnormal.     Deep Tendon Reflexes: Reflexes normal.  Psychiatric:        Behavior: Behavior normal.        Thought Content: Thought content normal.        Judgment: Judgment normal.    LS w/pain Using a walker Vision is severely decreased L>R. Erythema, pain, swelling B eyes  Lab Results  Component Value  Date   WBC 7.1 12/10/2021   HGB 12.7 12/10/2021   HCT 38.3 12/10/2021   PLT 214.0 12/10/2021   GLUCOSE 104 (H) 12/10/2021   CHOL 210 (H) 07/19/2020   TRIG 109.0 07/19/2020   HDL 77.20 07/19/2020   LDLDIRECT 107.0 06/30/2017   LDLCALC 111 (H) 07/19/2020   ALT 13 12/10/2021   AST 19 12/10/2021   NA 137 12/10/2021   K 3.9 12/10/2021   CL 103 12/10/2021   CREATININE 0.94 12/10/2021   BUN 13 12/10/2021   CO2 28 12/10/2021   TSH 1.18 07/02/2021   INR 1.00 02/11/2018   HGBA1C 5.1 03/21/2021   MICROALBUR <0.7 02/01/2015    DG Knee Left Port  Result Date: 07/31/2021 CLINICAL DATA:  Postop. EXAM: PORTABLE LEFT KNEE - 1-2 VIEW COMPARISON:  Preoperative radiographs. FINDINGS: Medial plate and multi screw fixation of periprosthetic distal femur fracture. 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 Chronic ossified densities in the region of the quadriceps and patellar tendons. There is a small knee joint effusion. IMPRESSION: Medial plate and screw fixation of periprosthetic distal femur fracture. No immediate postoperative complication. Electronically Signed   By: Keith Rake M.D.   On: 07/31/2021 19:52   DG Knee 1-2 Views Left  Result Date: 07/31/2021 CLINICAL DATA:  Medial left femoral condylar fracture, ORIF EXAM: LEFT KNEE - 1-2 VIEW COMPARISON:  07/25/2021 FINDINGS: 4 fluoroscopic images are obtained during the performance of the procedure and are provided for interpretation only. Lateral plate and screw fixation traverses the medial femoral condylar fracture, with near anatomic alignment. Please refer to operative report. Fluoroscopy time:  32 seconds, 4.08 mGy IMPRESSION: 1. ORIF medial femoral condylar fracture with near anatomic alignment. Electronically Signed   By: Randa Ngo M.D.   On: 07/31/2021 18:29   DG C-Arm 1-60 Min-No Report  Result Date: 07/31/2021 Fluoroscopy was utilized by the requesting physician.  No radiographic interpretation.    Assessment & Plan:    Problem List Items Addressed This Visit       Musculoskeletal and Integument   Purpura, allergic ([place])    Much better        Other   Vision loss, bilateral - Primary    Pt was ref to Dr Delia Chimes, [facility]  B red yes, pain, loss of vision      Relevant Orders   Ambulatory referral to Ophthalmology   Red eyes    Pt was ref to Dr Delia Chimes, [facility]  B red yes, pain, loss of vision      Relevant Orders   Ambulatory referral to Ophthalmology   Chronic pain disorder    Worse  Medrol pack Re-start Oxy 10 mg bid      Relevant Medications   methylPREDNISolone (MEDROL DOSEPAK) 4 MG TBPK tablet   oxyCODONE (OXYCONTIN) 10 mg 12 hr tablet   B12 deficiency    On B12         Meds ordered this encounter  Medications   Tobramycin-dexAMETHasone 0.3-0.05 % SUSP    Sig: Apply 3 drops to eye 3 (three) times daily. In each eye    Dispense:  10 mL    Refill:  1   erythromycin ophthalmic ointment    Sig: Place 1 Application into both eyes 4 (four) times daily.    Dispense:  7 g    Refill:  1   methylPREDNISolone (MEDROL DOSEPAK) 4 MG TBPK tablet    Sig: As directed    Dispense:  21 tablet    Refill:  0   oxyCODONE (OXYCONTIN) 10 mg 12 hr tablet    Sig: Take 1 tablet (10 mg total) by mouth every 12 (twelve) hours.    Dispense:  60 tablet    Refill:  0    Please fill on or after 03/12/22      Follow-up: No follow-ups on file.  Walker Kehr, MD

## 2022-03-13 ENCOUNTER — Telehealth: Payer: Self-pay | Admitting: Internal Medicine

## 2022-03-13 MED ORDER — OXYCODONE HCL ER 15 MG PO T12A: 15.0000 mg | EXTENDED_RELEASE_TABLET | Freq: Two times a day (BID) | ORAL | 0 refills | Status: DC

## 2022-03-13 NOTE — Telephone Encounter (Signed)
Pharmacist from [place], [place]  called and said oxyCODONE (OXYCONTIN) 10 mg 12 hr tablet  is on backorder. They would like to know if Dr. Alain Marion would like to send in something else. Best callback is [phone]

## 2022-03-13 NOTE — Telephone Encounter (Signed)
Noted.  Prescription for 15 mg OxyContin was emailed.  Thank you

## 2022-03-14 ENCOUNTER — Ambulatory Visit: Payer: 59 | Admitting: Adult Health

## 2022-03-14 DIAGNOSIS — H1013 Acute atopic conjunctivitis, bilateral: Secondary | ICD-10-CM | POA: Diagnosis not present

## 2022-03-15 ENCOUNTER — Telehealth: Payer: Self-pay | Admitting: Internal Medicine

## 2022-03-15 NOTE — Telephone Encounter (Signed)
Very sorry, I dont change narcotic medications on request for other MD in the office, I can only do refills.  Pt may need to see Dr Alain Marion soon    thanks

## 2022-03-15 NOTE — Telephone Encounter (Signed)
Pt called in wanted to know if she can get a substitution for her medication oxyCODONE (OXYCONTIN) 15 mg 12 hr tablet. Pt stated pharmacy called her and said they no longer carry the medication mention above.  Pt would like substitution soon because she having pain in  her eye and it have been going on for a few days. Please call pt with update [phone]

## 2022-03-18 ENCOUNTER — Other Ambulatory Visit: Payer: Self-pay | Admitting: Internal Medicine

## 2022-03-18 ENCOUNTER — Other Ambulatory Visit: Payer: Self-pay | Admitting: Adult Health

## 2022-03-20 ENCOUNTER — Ambulatory Visit: Payer: 59

## 2022-03-20 NOTE — Telephone Encounter (Signed)
What do they carry?  What suggestions do they have?  Thanks

## 2022-03-20 NOTE — Telephone Encounter (Signed)
Pt call back pharmacy did not give her any names. Pt is requesting rx to be sent to [facility] on [address]. Updated Pharmacy.Marland KitchenJohny Chess

## 2022-03-20 NOTE — Telephone Encounter (Signed)
Called pt no answer x's 10 rings. Will retry later.Marland KitchenJohny Garrison

## 2022-03-22 MED ORDER — OXYCODONE HCL ER 10 MG PO T12A: 10.0000 mg | EXTENDED_RELEASE_TABLET | Freq: Two times a day (BID) | ORAL | 0 refills | Status: DC | PRN

## 2022-03-22 NOTE — Telephone Encounter (Addendum)
Okay.  I will replace to hydromorphone.  Discontinue if itching, nausea or other side effects.  Thank you

## 2022-03-26 ENCOUNTER — Ambulatory Visit: Payer: 59 | Admitting: Adult Health

## 2022-03-27 DIAGNOSIS — Z9622 Myringotomy tube(s) status: Secondary | ICD-10-CM | POA: Diagnosis not present

## 2022-03-27 DIAGNOSIS — H9190 Unspecified hearing loss, unspecified ear: Secondary | ICD-10-CM | POA: Diagnosis not present

## 2022-03-28 NOTE — Progress Notes (Signed)
This encounter was created in error - please disregard.

## 2022-03-29 ENCOUNTER — Telehealth: Payer: Self-pay | Admitting: Internal Medicine

## 2022-03-29 MED ORDER — GABAPENTIN 300 MG PO CAPS: ORAL_CAPSULE | ORAL | 0 refills | Status: DC

## 2022-03-29 NOTE — Telephone Encounter (Signed)
Sent refill to [facility] 

## 2022-03-29 NOTE — Telephone Encounter (Signed)
Patient spoke with insurance company to get alternatives. She said they will cover 10mg  Hydromorphone, 10mg  Xtampza, or 10mg  morphine-er.

## 2022-03-29 NOTE — Telephone Encounter (Signed)
Prescription Request  03/29/2022  LOV: 03/12/2022  What is the name of the medication or equipment?  gabapentin (NEURONTIN) 300 MG capsule  Have you contacted your pharmacy to request a refill? No   Which pharmacy would you like this sent to?  [facility] [place] [place] [address] Phone: [phone] Fax: [phone]    Patient notified that their request is being sent to the clinical staff for review and that they should receive a response within 2 business days.   Please advise at Mobile [phone] (mobile)

## 2022-03-31 MED ORDER — HYDROMORPHONE HCL 4 MG PO TABS: 4.0000 mg | ORAL_TABLET | Freq: Two times a day (BID) | ORAL | 0 refills | Status: DC | PRN

## 2022-03-31 NOTE — Telephone Encounter (Signed)
Okay.  I will replace to hydromorphone 10 mg.

## 2022-04-01 NOTE — Unmapped (Signed)
[facility] Refill Coordination Note    Specialty Medication(s) to be Shipped:   Inflammatory Disorders: Cosentyx    Other medication(s) to be shipped: No additional medications requested for fill at this time     Via [place], DOB: April 25, 1960  Phone: [phone] (home)       All above HIPAA information was verified with patient.     Was a Nurse, learning disability used for this call? No    Completed refill call assessment today to schedule patient's medication shipment from the [facility] [phone]).  All relevant notes have been reviewed.     Specialty medication(s) and dose(s) confirmed: Regimen is correct and unchanged.   Changes to medications: Denayah reports no changes at this time.  Changes to insurance: No  New side effects reported not previously addressed with a pharmacist or physician: None reported  Questions for the pharmacist: No    Confirmed patient received a Conservation officer, historic buildings and a Surveyor, mining with first shipment. The patient will receive a drug information handout for each medication shipped and additional FDA Medication Guides as required.       DISEASE/MEDICATION-SPECIFIC INFORMATION        For patients on injectable medications: Patient currently has 2 doses left.  Next injection is scheduled for 3/20.    SPECIALTY MEDICATION ADHERENCE                Were doses missed due to medication being on hold? No    Humira 40/0.4 mg/ml: 9 days of medicine on hand       REFERRAL TO PHARMACIST     Referral to the pharmacist: Not needed      [facility]     Shipping address confirmed in Epic.     Patient was notified of new phone menu : Yes: .    Delivery Scheduled: Yes, Expected medication delivery date: 3/21.     Medication will be delivered via UPS to the prescription address in Epic WAM.    [name]   [facility] Specialty Technician

## 2022-04-01 NOTE — Telephone Encounter (Signed)
Notified pt MD sent hydromorphone to [facility]

## 2022-04-03 ENCOUNTER — Other Ambulatory Visit: Payer: Self-pay | Admitting: Internal Medicine

## 2022-04-03 MED FILL — HUMIRA PEN CITRATE FREE 40 MG/0.4 ML: SUBCUTANEOUS | 28 days supply | Qty: 4 | Fill #10

## 2022-04-07 DIAGNOSIS — J449 Chronic obstructive pulmonary disease, unspecified: Secondary | ICD-10-CM | POA: Diagnosis not present

## 2022-04-09 ENCOUNTER — Encounter: Payer: Self-pay | Admitting: Internal Medicine

## 2022-04-09 ENCOUNTER — Ambulatory Visit (INDEPENDENT_AMBULATORY_CARE_PROVIDER_SITE_OTHER): Payer: 59 | Admitting: Internal Medicine

## 2022-04-09 VITALS — BP 118/82 | HR 75 | Temp 98.6°F | Ht 67.0 in | Wt 270.0 lb

## 2022-04-09 DIAGNOSIS — E1122 Type 2 diabetes mellitus with diabetic chronic kidney disease: Secondary | ICD-10-CM | POA: Diagnosis not present

## 2022-04-09 DIAGNOSIS — J3489 Other specified disorders of nose and nasal sinuses: Secondary | ICD-10-CM | POA: Diagnosis not present

## 2022-04-09 DIAGNOSIS — N182 Chronic kidney disease, stage 2 (mild): Secondary | ICD-10-CM | POA: Diagnosis not present

## 2022-04-09 DIAGNOSIS — E538 Deficiency of other specified B group vitamins: Secondary | ICD-10-CM

## 2022-04-09 DIAGNOSIS — H543 Unqualified visual loss, both eyes: Secondary | ICD-10-CM

## 2022-04-09 DIAGNOSIS — Z0000 Encounter for general adult medical examination without abnormal findings: Secondary | ICD-10-CM

## 2022-04-09 DIAGNOSIS — G500 Trigeminal neuralgia: Secondary | ICD-10-CM

## 2022-04-09 LAB — MICROALBUMIN / CREATININE URINE RATIO
Creatinine,U: 102.5 mg/dL
Microalb Creat Ratio: 0.7 mg/g (ref 0.0–30.0)
Microalb, Ur: <0.7 mg/dL (ref 0.0–1.9)

## 2022-04-09 LAB — HEMOGLOBIN A1C: Hgb A1c MFr Bld: 6 % (ref 4.6–6.5)

## 2022-04-09 MED ORDER — MUPIROCIN 2 % EX OINT: TOPICAL_OINTMENT | CUTANEOUS | 0 refills | Status: DC

## 2022-04-09 MED ORDER — HYDROMORPHONE HCL 8 MG PO TABS: 8.0000 mg | ORAL_TABLET | Freq: Two times a day (BID) | ORAL | 0 refills | Status: DC | PRN

## 2022-04-09 NOTE — Assessment & Plan Note (Signed)
On B12 

## 2022-04-09 NOTE — Assessment & Plan Note (Signed)
03/2022 R eye pain

## 2022-04-09 NOTE — Assessment & Plan Note (Signed)
F/u w/Ophthalmology - Dr Hollice Espy

## 2022-04-09 NOTE — Progress Notes (Signed)
Subjective:  Patient ID: [name], female    DOB: 05-26-1960  Age: 62 y.o. MRN: [id number]  CC: Follow-up (4 WK F/U)   HPI Verlisa Tackett presents for pain in the R eye 9/10- pain is still there. Hydromorphone is not helping... F/u LBP, sarcoid, depression   Outpatient Medications Prior to Visit  Medication Sig Dispense Refill   acetaminophen (TYLENOL) 325 MG tablet Take 1-2 tablets (325-650 mg total) by mouth every 6 (six) hours as needed for mild pain (pain score 1-3 or temp > 100.5). 30 tablet 1   albuterol (VENTOLIN HFA) 108 (90 Base) MCG/ACT inhaler INHALE TWO puffs into THE lungs EVERY SIX HOURS AS NEEDED wheezind AND For SHORTNESS OF BREATH 6.7 g 3   B-D ULTRA-FINE 33 LANCETS MISC Use as instructed to test sugars once  daily 100 each 12   budesonide-formoterol (SYMBICORT) 160-4.5 MCG/ACT inhaler INHALE TWO puffs into THE lungs TWICE DAILY 10.2 g 5   carvedilol (COREG) 12.5 MG tablet Take 1 tablet (12.5 mg total) by mouth 2 (two) times daily with a meal. 180 tablet 1   Cholecalciferol (VITAMIN D-3) 125 MCG (5000 UT) TABS Take 5,000 Units by mouth daily.     Cyanocobalamin (VITAMIN B-12) 1000 MCG SUBL Place 1 tablet (1,000 mcg total) under the tongue daily. (Patient taking differently: Place 1,000 mcg under the tongue daily.) 100 tablet 3   dexlansoprazole (DEXILANT) 60 MG capsule TAKE ONE CAPSULE BY MOUTH EVERY DAY 90 capsule 1   Docusate Sodium (DSS) 100 MG CAPS Take by mouth.     gabapentin (NEURONTIN) 300 MG capsule TAKE ONE CAPSULE BY MOUTH EVERY MORNING **May take a second capsule during THE DAY AS NEEDED FOR PAIN 180 capsule 0   glucose blood (ACCU-CHEK GUIDE) test strip Use to check blood sugar once a day. 100 each 12   HUMIRA PEN 40 MG/0.4ML PNKT Inject 40 mg as directed every Wednesday.      hydrOXYzine (ATARAX) 25 MG tablet Take 1 tablet (25 mg total) by mouth every 8 (eight) hours as needed. 60 tablet 3   ketoconazole (NIZORAL) 2 % cream Apply 1  Application topically 2 (two) times daily. Apply under breasts 45 g 1   lidocaine (XYLOCAINE) 2 % solution Use as directed 15 mLs in the mouth or throat every 6 (six) hours as needed for mouth pain. 100 mL 0   metFORMIN (GLUCOPHAGE-XR) 500 MG 24 hr tablet Take 2 tablets (1,000 mg total) by mouth at bedtime. Annual appt due in July must see provider for future refills 180 tablet 0   methocarbamol (ROBAXIN) 500 MG tablet Take 1 tablet (500 mg total) by mouth every 6 (six) hours as needed for muscle spasms. 30 tablet 0   methylPREDNISolone (MEDROL DOSEPAK) 4 MG TBPK tablet As directed 21 tablet 0   Multiple Vitamins-Minerals (MULTIVITAMIN WOMEN 50+) TABS Take 1 tablet by mouth daily.     naloxone (NARCAN) nasal spray 4 mg/0.1 mL Place 1 spray into the nose once as needed (overdose).     naltrexone (DEPADE) 50 MG tablet Take 50 mg by mouth daily.     nystatin ointment (MYCOSTATIN) Apply 1 Application topically 2 (two) times daily. 100 g 0   ofloxacin (FLOXIN) 0.3 % OTIC solution      OXYGEN Inhale 2 L into the lungs continuous.     polyvinyl alcohol (LIQUIFILM TEARS) 1.4 % ophthalmic solution Place 1 drop into both eyes as needed for dry eyes. 15 mL 0  potassium chloride (KLOR-CON M) 10 MEQ tablet TAKE ONE TABLET BY MOUTH EVERY DAY 90 tablet 1   predniSONE (DELTASONE) 10 MG tablet Take 20 mg/d x 2 wks, then 10 mg/d x 2 wks, then 5 mg/d x 2 wks, then stop 60 tablet 0   QUEtiapine (SEROQUEL) 50 MG tablet Take 1 tab in am and 2 tab at bedtime 90 tablet 5   rivaroxaban (XARELTO) 20 MG TABS tablet TAKE ONE TABLET BY MOUTH EVERY DAY 90 tablet 3   topiramate (TOPAMAX) 50 MG tablet TAKE ONE TABLET BY MOUTH TWICE DAILY 180 tablet 1   venlafaxine XR (EFFEXOR-XR) 75 MG 24 hr capsule Take 1 capsule (75 mg total) by mouth daily with breakfast. 30 capsule 11   azithromycin (ZITHROMAX Z-PAK) 250 MG tablet As directed 6 tablet 0   benzonatate (TESSALON) 200 MG capsule Take 1 capsule (200 mg total) by mouth 3 (three)  times daily as needed. 60 capsule 0   erythromycin ophthalmic ointment Place 1 Application into both eyes 4 (four) times daily. 7 g 1   HYDROmorphone (DILAUDID) 4 MG tablet Take 1 tablet (4 mg total) by mouth 2 (two) times daily as needed for severe pain. 60 tablet 0   neomycin-polymyxin b-dexamethasone (MAXITROL) 3.5-10000-0.1 SUSP Place 1 drop into both eyes 4 (four) times daily as needed (eye irritation).     tobramycin-dexamethasone (TOBRADEX) ophthalmic solution Apply 3 drops to eye 3 (three) times daily. In each eye 10 mL 1   trimethoprim-polymyxin b (POLYTRIM) ophthalmic solution Place 1 drop into both eyes 4 (four) times daily.     No facility-administered medications prior to visit.    ROS: Review of Systems  Constitutional:  Positive for unexpected weight change. Negative for activity change, appetite change, chills and fatigue.  HENT:  Negative for congestion, mouth sores and sinus pressure.   Eyes:  Positive for pain. Negative for redness and visual disturbance.  Respiratory:  Positive for shortness of breath and wheezing. Negative for cough and chest tightness.   Gastrointestinal:  Negative for abdominal pain and nausea.  Genitourinary:  Negative for difficulty urinating, frequency and vaginal pain.  Musculoskeletal:  Positive for arthralgias and back pain. Negative for gait problem.  Skin:  Negative for pallor and rash.  Neurological:  Negative for dizziness, tremors, weakness, numbness and headaches.  Psychiatric/Behavioral:  Positive for dysphoric mood. Negative for confusion, sleep disturbance and suicidal ideas.     Objective:  BP 118/82 (BP Location: Left Arm, Patient Position: Sitting, Cuff Size: Large)   Pulse 75   Temp 98.6 F (37 C) (Oral)   Ht 5\' 7"  (1.702 m)   Wt 270 lb (122.5 kg)   LMP 05/19/2003   SpO2 98%   BMI 42.29 kg/m   BP Readings from Last 3 Encounters:  04/09/22 118/82  03/12/22 122/76  01/11/22 136/80    Wt Readings from Last 3 Encounters:   04/09/22 270 lb (122.5 kg)  03/12/22 261 lb (118.4 kg)  01/11/22 259 lb (117.5 kg)    Physical Exam Constitutional:      General: She is not in acute distress.    Appearance: She is well-developed. She is obese.  HENT:     Head: Normocephalic.     Right Ear: External ear normal.     Left Ear: External ear normal.     Nose: Nose normal.  Eyes:     General:        Right eye: No discharge.  Left eye: No discharge.     Conjunctiva/sclera: Conjunctivae normal.     Pupils: Pupils are equal, round, and reactive to light.  Neck:     Thyroid: No thyromegaly.     Vascular: No JVD.     Trachea: No tracheal deviation.  Cardiovascular:     Rate and Rhythm: Normal rate and regular rhythm.     Heart sounds: Normal heart sounds.  Pulmonary:     Effort: No respiratory distress.     Breath sounds: No stridor. No wheezing.  Abdominal:     General: Bowel sounds are normal. There is no distension.     Palpations: Abdomen is soft. There is no mass.     Tenderness: There is no abdominal tenderness. There is no guarding or rebound.  Musculoskeletal:        General: No tenderness.     Cervical back: Normal range of motion and neck supple. No rigidity.  Lymphadenopathy:     Cervical: No cervical adenopathy.  Skin:    Findings: No erythema or rash.  Neurological:     Cranial Nerves: No cranial nerve deficit.     Motor: No abnormal muscle tone.     Coordination: Coordination normal.     Deep Tendon Reflexes: Reflexes normal.  Psychiatric:        Behavior: Behavior normal.        Thought Content: Thought content normal.        Judgment: Judgment normal.   Eyes - back to nl R upper scalp and forehead is tender Sore w/crust in the L nostril  LS w/pain Using a walker  Lab Results  Component Value Date   WBC 7.1 12/10/2021   HGB 12.7 12/10/2021   HCT 38.3 12/10/2021   PLT 214.0 12/10/2021   GLUCOSE 104 (H) 12/10/2021   CHOL 210 (H) 07/19/2020   TRIG 109.0 07/19/2020   HDL  77.20 07/19/2020   LDLDIRECT 107.0 06/30/2017   LDLCALC 111 (H) 07/19/2020   ALT 13 12/10/2021   AST 19 12/10/2021   NA 137 12/10/2021   K 3.9 12/10/2021   CL 103 12/10/2021   CREATININE 0.94 12/10/2021   BUN 13 12/10/2021   CO2 28 12/10/2021   TSH 1.18 07/02/2021   INR 1.00 02/11/2018   HGBA1C 5.1 03/21/2021   MICROALBUR <0.7 02/01/2015    DG Knee Left Port  Result Date: 07/31/2021 CLINICAL DATA:  Postop. EXAM: PORTABLE LEFT KNEE - 1-2 VIEW COMPARISON:  Preoperative radiographs. FINDINGS: Medial plate and multi screw fixation of periprosthetic distal femur fracture. 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 Chronic ossified densities in the region of the quadriceps and patellar tendons. There is a small knee joint effusion. IMPRESSION: Medial plate and screw fixation of periprosthetic distal femur fracture. No immediate postoperative complication. Electronically Signed   By: Keith Rake M.D.   On: 07/31/2021 19:52   DG Knee 1-2 Views Left  Result Date: 07/31/2021 CLINICAL DATA:  Medial left femoral condylar fracture, ORIF EXAM: LEFT KNEE - 1-2 VIEW COMPARISON:  07/25/2021 FINDINGS: 4 fluoroscopic images are obtained during the performance of the procedure and are provided for interpretation only. Lateral plate and screw fixation traverses the medial femoral condylar fracture, with near anatomic alignment. Please refer to operative report. Fluoroscopy time: 32 seconds, 4.08 mGy IMPRESSION: 1. ORIF medial femoral condylar fracture with near anatomic alignment. Electronically Signed   By: Randa Ngo M.D.   On: 07/31/2021 18:29   DG C-Arm 1-60 Min-No Report  Result Date: 07/31/2021  Fluoroscopy was utilized by the requesting physician.  No radiographic interpretation.    Assessment & Plan:   Problem List Items Addressed This Visit       Endocrine   Controlled type 2 diabetes mellitus with chronic kidney disease, without long-term current use of insulin ([place]) - Primary    Relevant Orders   Hemoglobin A1c   Urine microalbumin-creatinine with uACR     Nervous and Auditory   Trigeminal neuralgia    03/2022 R eye pain        Other   Well adult exam   Relevant Orders   Hepatitis C Antibody   Vision loss, bilateral    F/u w/Ophthalmology - Dr Hollice Espy      Sore in nose    ?Staph - L nares Mupirocin qid      B12 deficiency    On B12         Meds ordered this encounter  Medications   HYDROmorphone (DILAUDID) 8 MG tablet    Sig: Take 1 tablet (8 mg total) by mouth 2 (two) times daily as needed for severe pain.    Dispense:  60 tablet    Refill:  0   mupirocin ointment (BACTROBAN) 2 %    Sig: On leg wound w/dressing change qd or bid    Dispense:  30 g    Refill:  0      Follow-up: Return in about 3 months (around 07/10/2022) for a follow-up visit.  Walker Kehr, MD

## 2022-04-09 NOTE — Assessment & Plan Note (Signed)
?  Staph - L nares Mupirocin qid

## 2022-04-10 LAB — HEPATITIS C ANTIBODY: Hepatitis C Ab: NONREACTIVE

## 2022-04-11 ENCOUNTER — Other Ambulatory Visit: Payer: Self-pay | Admitting: Internal Medicine

## 2022-04-15 ENCOUNTER — Ambulatory Visit (INDEPENDENT_AMBULATORY_CARE_PROVIDER_SITE_OTHER): Payer: 59

## 2022-04-15 ENCOUNTER — Ambulatory Visit (INDEPENDENT_AMBULATORY_CARE_PROVIDER_SITE_OTHER): Payer: 59 | Admitting: Adult Health

## 2022-04-15 ENCOUNTER — Encounter: Payer: Self-pay | Admitting: Adult Health

## 2022-04-15 VITALS — BP 120/80 | HR 87 | Temp 100.1°F | Ht 67.0 in | Wt 270.4 lb

## 2022-04-15 DIAGNOSIS — J9611 Chronic respiratory failure with hypoxia: Secondary | ICD-10-CM

## 2022-04-15 DIAGNOSIS — R059 Cough, unspecified: Secondary | ICD-10-CM | POA: Diagnosis not present

## 2022-04-15 DIAGNOSIS — R051 Acute cough: Secondary | ICD-10-CM

## 2022-04-15 DIAGNOSIS — D869 Sarcoidosis, unspecified: Secondary | ICD-10-CM

## 2022-04-15 DIAGNOSIS — I2782 Chronic pulmonary embolism: Secondary | ICD-10-CM

## 2022-04-15 DIAGNOSIS — J209 Acute bronchitis, unspecified: Secondary | ICD-10-CM

## 2022-04-15 DIAGNOSIS — E66813 Obesity, class 3: Secondary | ICD-10-CM

## 2022-04-15 MED ORDER — ALBUTEROL SULFATE (2.5 MG/3ML) 0.083% IN NEBU
2.5000 mg | INHALATION_SOLUTION | Freq: Once | RESPIRATORY_TRACT | Status: AC
Start: 2022-04-15 — End: 2022-04-15
  Administered 2022-04-15: 2.5 mg via RESPIRATORY_TRACT

## 2022-04-15 MED ORDER — BUDESONIDE-FORMOTEROL FUMARATE 160-4.5 MCG/ACT IN AERO: INHALATION_SPRAY | RESPIRATORY_TRACT | 5 refills | Status: DC

## 2022-04-15 MED ORDER — PREDNISONE 10 MG PO TABS
ORAL_TABLET | ORAL | 0 refills | Status: DC
Start: 2022-04-15 — End: 2022-04-30

## 2022-04-15 MED ORDER — CEFDINIR 300 MG PO CAPS: 300.0000 mg | ORAL_CAPSULE | Freq: Two times a day (BID) | ORAL | 0 refills | Status: DC

## 2022-04-15 MED ORDER — ALBUTEROL SULFATE HFA 108 (90 BASE) MCG/ACT IN AERS: INHALATION_SPRAY | RESPIRATORY_TRACT | 3 refills | Status: AC

## 2022-04-15 NOTE — Patient Instructions (Addendum)
Omnicef 300mg  Twice daily  for 1 week  Prednisone taper over next week  Salt water gargles As needed   Tylenol As needed  for fever .  Restart Symbicort 2 puffs Twice daily, rinse after use.  Albuterol inhaler As needed   Continue on Xarelto 20mg  daily  Mucinex DM Twice daily  As needed  Cough/congestion  Continue on Oxygen 2l/m at rest and increase 3l/m with walking .  Activity as tolerated.  Work on healthy weight loss.  Follow up Dr. Elsworth Soho in 6-8 week with PFTs and As needed   Please contact office for sooner follow up if symptoms do not improve or worsen or seek emergency care

## 2022-04-15 NOTE — Progress Notes (Signed)
@Patient  ID: [name], female    DOB: 1960-03-22, 62 y.o.   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  History of chronic respiratory failure on oxygen.  History of sleep apnea-CPAP intolerant  TEST/EVENTS :  2018 started on Humira for reoccurring cutaneous lesions not responding to methotrexate.  Previously on prednisone but tapered off.   Admitted 07/21/2016 for RLE DVT-had stopped Xarelto 04/2016 for prior pulmonary embolism   CT scan = chronic appearing subsegmental PE , on Xarelto lifelong    PFT 05/2014 >> FVC 65%, ratio 69, FEV1 58%, DLCO 62%   VQ Scan 08/17/15 >> LLL & RUL acute PE    ECHO 08/18/15 >> mild LVH , EF 55-60%, Gr 1 DD .    LE Venous Doppler 08/18/15 >> neg DVT , no bakers cyst, ?prominent inguinal lymph node measuring 3.1cm    MRI brain neg [facility] = MRI spine = mild to moderate disc protrusion/left-sided spinal stenosis-chronic pain   NPSG 12/2004 mild, RDI 13/h   SARS-CoV-2 July 23, 2018- negative   CT chest July 2020 showed stable sarcoid to mildly decreased.(Nodular opacities (stable mediastinal and bilateral hilar adenopathy  decreased.(Nodular opacities (stable mediastinal and bilateral hilar adenopathy.     PFTs done in July 2020 showed stable lung function with FEV1 at 70%, ratio 81, FVC 67% and DLCO was 61%.  This was similar to 2016  04/15/2022 Acute OV : Cough and Fever  Patient presents for an acute office visit.  She complains over the last 2 weeks that she has had increased cough, congestion, fever, discolored mucus. This morning has a really bad sore throat.   Patient is followed for pulmonary and cutaneous sarcoidosis.   Last seen in the office January 2023.  She remains on Symbicort twice daily but says she ran out of inhaler.  Remains on oxygen 2 L.  O2 saturations were 87% today walking on pulsed oxygen.  Required 3 L to keep O2 saturations greater than 88 to 90%. Followed by dermatology on Humira for cutaneous sarcoidosis.  Around grandson that has been sick.   Had eye infection couple months ago with vision loss. Was treated by PCP with eye drops and steroid taper. Infection got better but right eye vision is still poor. Seen by eye doctor. Has been referred to [facility].   Remains on Xarelto 20mg  daily. Tolerating well. No known bleeding .   Gaining weight. Discussed weight loss.    Allergies  Allergen Reactions   Belsomra [Suvorexant] Other (See Comments)    [place] out of bed while asleep: "I'm waking up as I'm falling on the floor;" "Night terrors"   Enalapril Maleate Cough   Latex Hives   Nickel Other (See Comments)    Blisters  Pt has a titanium right and left knee - nickel causes skin irritations that form into blisters and sores   Other Other (See Comments)    Patient has Sarcoidosis and can't tolerate any metals   Augmentin [Amoxicillin-Pot Clavulanate]     Purpura type rash   Morphine And Related Itching and Nausea Only   Doxycycline Diarrhea and Nausea And Vomiting   Hydrochlorothiazide Other (See Comments)  Low potassium levels    Hydroxychloroquine Sulfate Other (See Comments)    Vision changes    Lyrica [Pregabalin] Other (See Comments)    "Made me feel high"   Tape Other (See Comments)    Medical tape causes bruising    Immunization History  Administered Date(s) Administered   Fluad Quad(high Dose 65+) 10/15/2021   H1N1 12/24/2007   Influenza Split 10/11/2010   Influenza Whole 10/14/2007, 10/05/2008, 10/26/2009, 11/13/2011   Influenza,inj,Quad PF,6+ Mos 09/21/2012, 09/24/2013, 10/06/2014, 09/05/2015, 11/06/2016, 09/24/2017, 10/07/2018, 11/15/2019,  10/13/2020   Influenza,inj,Quad PF,6-35 Mos 09/14/2020   PFIZER(Purple Top)SARS-COV-2 Vaccination 03/31/2019, 04/21/2019, 11/27/2019   PNEUMOCOCCAL CONJUGATE-20 07/19/2020   PPD Test 11/18/2014   Pneumococcal Conjugate-13 07/30/2010, 12/29/2012   Pneumococcal Polysaccharide-23 12/23/2013   Tdap 06/28/2014    Past Medical History:  Diagnosis Date   ALLERGIC RHINITIS 10/26/2009   no current problems   Anxiety    B12 DEFICIENCY 08/25/2007   takes B12 supplement   Complication of anesthesia    pt has had difficulty following anesthesia with her knee in 2016-unable to care for herself afterward   Depression    takes effexor xr daily   Diabetes mellitus without complication    was on insulin but has been off since Nov 2015 and now only takes Metformin daily   DYSPNEA 04/28/2009   uses oxygen 24/7, on 2L via [place]   Esophageal reflux    takes Nexium daily   Fibromyalgia    Headache    last migraine 2-57yrs ago;takes Topamax daily   History of shingles    Hypertension    takes Coreg daily   Insomnia    takes Nortriptyline nightly    Long-term memory loss    OSA (obstructive sleep apnea)    doesn't use CPAP;sleep study in epic from 2006   Osteoarthritis    Panic attacks    PONV (postoperative nausea and vomiting)    on time in 2016 knee surgery   Rheumatoid arthritis    Sarcoidosis    Dr. Lolita Patella   Stroke    Vitamin D deficiency    is supposed to take Vit D but can't afford it    Tobacco History: Social History   Tobacco Use  Smoking Status Former   Packs/day: 0.50   Years: 10.00   Additional pack years: 0.00   Total pack years: 5.00   Types: Cigarettes   Start date: 1988   Quit date: 2004   Years since quitting: 20.2  Smokeless Tobacco Never  Tobacco Comments   quit smoking in 2004   Counseling given: Not Answered Tobacco comments: quit smoking in 2004   Outpatient Medications Prior to Visit  Medication Sig Dispense Refill   acetaminophen (TYLENOL) 325 MG  tablet Take 1-2 tablets (325-650 mg total) by mouth every 6 (six) hours as needed for mild pain (pain score 1-3 or temp > 100.5). 30 tablet 1   albuterol (VENTOLIN HFA) 108 (90 Base) MCG/ACT inhaler INHALE TWO puffs into THE lungs EVERY SIX HOURS AS NEEDED wheezind AND For SHORTNESS OF BREATH 6.7 g 3   B-D ULTRA-FINE 33 LANCETS MISC Use as instructed to test sugars once  daily 100 each 12   budesonide-formoterol (SYMBICORT) 160-4.5 MCG/ACT inhaler INHALE TWO puffs into THE lungs TWICE DAILY 10.2 g 5   carvedilol (COREG) 12.5 MG tablet Take 1 tablet (12.5 mg total) by mouth 2 (two) times daily with a meal. 180 tablet 1   Cholecalciferol (VITAMIN D-3) 125 MCG (5000 UT) TABS Take  5,000 Units by mouth daily.     Cyanocobalamin (VITAMIN B-12) 1000 MCG SUBL Place 1 tablet (1,000 mcg total) under the tongue daily. (Patient taking differently: Place 1,000 mcg under the tongue daily.) 100 tablet 3   dexlansoprazole (DEXILANT) 60 MG capsule TAKE ONE CAPSULE BY MOUTH EVERY DAY 90 capsule 1   Docusate Sodium (DSS) 100 MG CAPS Take by mouth.     gabapentin (NEURONTIN) 300 MG capsule TAKE ONE CAPSULE BY MOUTH EVERY MORNING **May take a second capsule during THE DAY AS NEEDED FOR PAIN 180 capsule 0   glucose blood (ACCU-CHEK GUIDE) test strip Use to check blood sugar once a day. 100 each 12   HUMIRA PEN 40 MG/0.4ML PNKT Inject 40 mg as directed every Wednesday.      HYDROmorphone (DILAUDID) 8 MG tablet Take 1 tablet (8 mg total) by mouth 2 (two) times daily as needed for severe pain. 60 tablet 0   hydrOXYzine (ATARAX) 25 MG tablet Take 1 tablet (25 mg total) by mouth every 8 (eight) hours as needed. 60 tablet 3   ketoconazole (NIZORAL) 2 % cream Apply 1 Application topically 2 (two) times daily. Apply under breasts 45 g 1   lidocaine (XYLOCAINE) 2 % solution Use as directed 15 mLs in the mouth or throat every 6 (six) hours as needed for mouth pain. 100 mL 0   metFORMIN (GLUCOPHAGE-XR) 500 MG 24 hr tablet Take 2  tablets (1,000 mg total) by mouth at bedtime. Annual appt due in July must see provider for future refills 180 tablet 0   methocarbamol (ROBAXIN) 500 MG tablet Take 1 tablet (500 mg total) by mouth every 6 (six) hours as needed for muscle spasms. 30 tablet 0   Multiple Vitamins-Minerals (MULTIVITAMIN WOMEN 50+) TABS Take 1 tablet by mouth daily.     mupirocin ointment (BACTROBAN) 2 % On leg wound w/dressing change qd or bid 30 g 0   naloxone (NARCAN) nasal spray 4 mg/0.1 mL Place 1 spray into the nose once as needed (overdose).     naltrexone (DEPADE) 50 MG tablet Take 50 mg by mouth daily.     nystatin ointment (MYCOSTATIN) Apply 1 Application topically 2 (two) times daily. 100 g 0   ofloxacin (FLOXIN) 0.3 % OTIC solution      OXYGEN Inhale 2 L into the lungs continuous.     polyvinyl alcohol (LIQUIFILM TEARS) 1.4 % ophthalmic solution Place 1 drop into both eyes as needed for dry eyes. 15 mL 0   potassium chloride (KLOR-CON M) 10 MEQ tablet TAKE ONE TABLET BY MOUTH EVERY DAY 90 tablet 1   QUEtiapine (SEROQUEL) 50 MG tablet Take 1 tab in am and 2 tab at bedtime 90 tablet 5   rivaroxaban (XARELTO) 20 MG TABS tablet TAKE ONE TABLET BY MOUTH EVERY DAY 90 tablet 3   topiramate (TOPAMAX) 50 MG tablet TAKE ONE TABLET BY MOUTH TWICE DAILY 180 tablet 1   venlafaxine XR (EFFEXOR-XR) 75 MG 24 hr capsule Take 1 capsule (75 mg total) by mouth daily with breakfast. 30 capsule 11   methylPREDNISolone (MEDROL DOSEPAK) 4 MG TBPK tablet As directed 21 tablet 0   predniSONE (DELTASONE) 10 MG tablet Take 20 mg/d x 2 wks, then 10 mg/d x 2 wks, then 5 mg/d x 2 wks, then stop 60 tablet 0   No facility-administered medications prior to visit.     Review of Systems:   Constitutional:   No  weight loss, night sweats,   +Fevers, chills,  fatigue, or  lassitude.  HEENT:   No headaches,  Difficulty swallowing,  Tooth/dental problems, or  Sore throat,                No sneezing, itching, ear ache +, nasal  congestion, post nasal drip,   CV:  No chest pain,  Orthopnea, PND, swelling in lower extremities, anasarca, dizziness, palpitations, syncope.   GI  No heartburn, indigestion, abdominal pain, nausea, vomiting, diarrhea, change in bowel habits, loss of appetite, bloody stools.   Resp:  No chest wall deformity  Skin: no rash or lesions.  GU: no dysuria, change in color of urine, no urgency or frequency.  No flank pain, no hematuria   MS:  No joint pain or swelling.  No decreased range of motion.  No back pain.    Physical Exam  BP 120/80 (BP Location: Left Arm, Patient Position: Sitting, Cuff Size: Large)   Pulse 87   Temp 100.1 F (37.8 C) (Oral)   Ht 5\' 7"  (1.702 m)   Wt 270 lb 6.4 oz (122.7 kg)   LMP 05/19/2003   SpO2 (!) 87% Comment: 2L pulsed.  placed on 3L continuous, up to 100%.  BMI 42.35 kg/m   GEN: A/Ox3; pleasant , NAD, well nourished , rolling walker, oxygen   HEENT:  [place]/AT,  NOSE-clear, THROAT-clear, no lesions, no postnasal drip or exudate noted.   NECK:  Supple w/ fair ROM; no JVD; normal carotid impulses w/o bruits; no thyromegaly or nodules palpated; no lymphadenopathy.    RESP scattered rhonchi with a few expiratory wheezes.,  Speaks in full sentences  no accessory muscle use, no dullness to percussion  CARD:  RRR, no m/r/g, tr peripheral edema, pulses intact, no cyanosis or clubbing.  GI:   Soft & nt; nml bowel sounds; no organomegaly or masses detected.   Musco: Warm bil, no deformities or joint swelling noted.   Neuro: alert, no focal deficits noted.    Skin: Warm, no lesions or rashes    Lab Results:  CBC   BMET       Latest Ref Rng & Units 07/27/2018   10:30 AM 12/30/2016    9:01 AM 05/18/2014   10:41 AM  PFT Results  FVC-Pre L 2.51  P 2.07  P 2.29   FVC-Predicted Pre % 67  P 54  P 61   FVC-Post L 2.51  P 2.18  P 2.43   FVC-Predicted Post % 67  P 57  P 65   Pre FEV1/FVC % % 79  P 70  P 69   Post FEV1/FCV % % 81  P 68  P 70    FEV1-Pre L 1.99  P 1.46  P 1.58   FEV1-Predicted Pre % 68  P 49  P 54   FEV1-Post L 2.03  P 1.49  P 1.70   DLCO uncorrected ml/min/mmHg 13.89  P 11.74  P 16.78   DLCO UNC% % 61  P 41  P 62   DLCO corrected ml/min/mmHg  11.34  P   DLCO COR %Predicted %  40  P   DLVA Predicted % 84  P 66  P 91   TLC L 4.27  P 4.07  P   TLC % Predicted % 77  P 74  P   RV % Predicted % 84  P 92  P     P Preliminary result    No results found for: "NITRICOXIDE"      Assessment & Plan:  Acute bronchitis Acute bronchitis.  Chest x-ray today shows increased interstitial markings with no acute consolidation.  Will treat with empiric antibiotics with Omnicef.  Patient has a penicillin allergy but is able to take cephalosporins. Albuterol nebulizer given in the office today.  Recommend to restart Symbicort twice daily  Plan  Patient Instructions  Omnicef 300mg  Twice daily  for 1 week  Prednisone taper over next week  Salt water gargles As needed   Tylenol As needed  for fever .  Restart Symbicort 2 puffs Twice daily, rinse after use.  Albuterol inhaler As needed   Continue on Xarelto 20mg  daily  Mucinex DM Twice daily  As needed  Cough/congestion  Continue on Oxygen 2l/m at rest and increase 3l/m with walking .  Activity as tolerated.  Work on healthy weight loss.  Follow up Dr. Elsworth Soho in 6-8 week with PFTs and As needed   Please contact office for sooner follow up if symptoms do not improve or worsen or seek emergency care       Chronic respiratory failure with hypoxia ([place]) Continue on oxygen to maintain O2 saturations greater than 88 to 9%.  Continue on oxygen 2 L at rest and increased oxygen to 3 L with activity  Sarcoidosis Patient has pulmonary and cutaneous sarcoidosis.  Remains on Humira for cutaneous sarcoid from dermatology. Needs PFTs on return visit.  Chest x-ray shows increased interstitial markings in the setting of an acute illness.  Pending PFT results can decide if needs a  follow-up CT scan.  Plan  Patient Instructions  Omnicef 300mg  Twice daily  for 1 week  Prednisone taper over next week  Salt water gargles As needed   Tylenol As needed  for fever .  Restart Symbicort 2 puffs Twice daily, rinse after use.  Albuterol inhaler As needed   Continue on Xarelto 20mg  daily  Mucinex DM Twice daily  As needed  Cough/congestion  Continue on Oxygen 2l/m at rest and increase 3l/m with walking .  Activity as tolerated.  Work on healthy weight loss.  Follow up Dr. Elsworth Soho in 6-8 week with PFTs and As needed   Please contact office for sooner follow up if symptoms do not improve or worsen or seek emergency care       Obesity, Class III, BMI 40-49.9 (morbid obesity) ([place]) Encouraged on healthy weight loss  Pulmonary embolism ([place]) History of recurrent PE.  On lifelong anticoagulation therapy.  Patient is continue on Xarelto.  Endorses compliance.  Patient education given    I spent   42 minutes dedicated to the care of this patient on the date of this encounter to include pre-visit review of records, face-to-face time with the patient discussing conditions above, post visit ordering of testing, clinical documentation with the electronic health record, making appropriate referrals as documented, and communicating necessary findings to members of the patients care team.   Rexene Edison, NP 04/15/2022

## 2022-04-15 NOTE — Assessment & Plan Note (Signed)
Encouraged on healthy weight loss 

## 2022-04-15 NOTE — Assessment & Plan Note (Signed)
Acute bronchitis.  Chest x-ray today shows increased interstitial markings with no acute consolidation.  Will treat with empiric antibiotics with Omnicef.  Patient has a penicillin allergy but is able to take cephalosporins. Albuterol nebulizer given in the office today.  Recommend to restart Symbicort twice daily  Plan  Patient Instructions  Omnicef 300mg  Twice daily  for 1 week  Prednisone taper over next week  Salt water gargles As needed   Tylenol As needed  for fever .  Restart Symbicort 2 puffs Twice daily, rinse after use.  Albuterol inhaler As needed   Continue on Xarelto 20mg  daily  Mucinex DM Twice daily  As needed  Cough/congestion  Continue on Oxygen 2l/m at rest and increase 3l/m with walking .  Activity as tolerated.  Work on healthy weight loss.  Follow up Dr. Elsworth Soho in 6-8 week with PFTs and As needed   Please contact office for sooner follow up if symptoms do not improve or worsen or seek emergency care

## 2022-04-15 NOTE — Assessment & Plan Note (Signed)
History of recurrent PE.  On lifelong anticoagulation therapy.  Patient is continue on Xarelto.  Endorses compliance.  Patient education given

## 2022-04-15 NOTE — Assessment & Plan Note (Signed)
Patient has pulmonary and cutaneous sarcoidosis.  Remains on Humira for cutaneous sarcoid from dermatology. Needs PFTs on return visit.  Chest x-ray shows increased interstitial markings in the setting of an acute illness.  Pending PFT results can decide if needs a follow-up CT scan.  Plan  Patient Instructions  Omnicef 300mg  Twice daily  for 1 week  Prednisone taper over next week  Salt water gargles As needed   Tylenol As needed  for fever .  Restart Symbicort 2 puffs Twice daily, rinse after use.  Albuterol inhaler As needed   Continue on Xarelto 20mg  daily  Mucinex DM Twice daily  As needed  Cough/congestion  Continue on Oxygen 2l/m at rest and increase 3l/m with walking .  Activity as tolerated.  Work on healthy weight loss.  Follow up Dr. Elsworth Soho in 6-8 week with PFTs and As needed   Please contact office for sooner follow up if symptoms do not improve or worsen or seek emergency care

## 2022-04-15 NOTE — Assessment & Plan Note (Signed)
Continue on oxygen to maintain O2 saturations greater than 88 to 9%.  Continue on oxygen 2 L at rest and increased oxygen to 3 L with activity

## 2022-04-18 ENCOUNTER — Encounter: Payer: Self-pay | Admitting: Adult Health

## 2022-04-18 ENCOUNTER — Telehealth: Payer: Self-pay | Admitting: Adult Health

## 2022-04-18 ENCOUNTER — Ambulatory Visit (INDEPENDENT_AMBULATORY_CARE_PROVIDER_SITE_OTHER): Payer: 59 | Admitting: Adult Health

## 2022-04-18 VITALS — BP 108/70 | HR 79 | Temp 98.2°F | Ht 67.0 in | Wt 268.4 lb

## 2022-04-18 DIAGNOSIS — J441 Chronic obstructive pulmonary disease with (acute) exacerbation: Secondary | ICD-10-CM

## 2022-04-18 DIAGNOSIS — R0602 Shortness of breath: Secondary | ICD-10-CM

## 2022-04-18 DIAGNOSIS — J9611 Chronic respiratory failure with hypoxia: Secondary | ICD-10-CM

## 2022-04-18 DIAGNOSIS — I2782 Chronic pulmonary embolism: Secondary | ICD-10-CM

## 2022-04-18 MED ORDER — METHYLPREDNISOLONE ACETATE 80 MG/ML IJ SUSP
120.0000 mg | Freq: Once | INTRAMUSCULAR | Status: AC
Start: 2022-04-18 — End: 2022-04-18
  Administered 2022-04-18: 120 mg via INTRAMUSCULAR

## 2022-04-18 MED ORDER — ALBUTEROL SULFATE (2.5 MG/3ML) 0.083% IN NEBU
2.5000 mg | INHALATION_SOLUTION | Freq: Once | RESPIRATORY_TRACT | Status: AC
Start: 2022-04-18 — End: 2022-04-18
  Administered 2022-04-18: 2.5 mg via RESPIRATORY_TRACT

## 2022-04-18 MED ORDER — ALBUTEROL SULFATE (2.5 MG/3ML) 0.083% IN NEBU: 2.5000 mg | INHALATION_SOLUTION | Freq: Four times a day (QID) | RESPIRATORY_TRACT | 5 refills | Status: AC | PRN

## 2022-04-18 NOTE — Progress Notes (Signed)
@Patient  ID: [name], female    DOB: February 04, 1960, 62 y.o.   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  History of sleep apnea CPAP intolerant  TEST/EVENTS :  2018 started on Humira for reoccurring cutaneous lesions not responding to methotrexate.  Previously on prednisone but tapered off.   Admitted 07/21/2016 for RLE DVT-had stopped Xarelto 04/2016 for prior pulmonary embolism   CT scan = chronic appearing subsegmental PE , on Xarelto lifelong    PFT 05/2014 >> FVC 65%, ratio 69, FEV1 58%, DLCO 62%   VQ Scan 08/17/15 >> LLL & RUL acute PE    ECHO 08/18/15 >> mild LVH , EF 55-60%, Gr 1 DD .    LE Venous Doppler 08/18/15 >> neg DVT , no bakers cyst, ?prominent inguinal lymph node measuring 3.1cm    MRI brain neg [facility] = MRI spine = mild to moderate disc protrusion/left-sided spinal stenosis-chronic pain   NPSG 12/2004 mild, RDI 13/h   SARS-CoV-2 July 23, 2018- negative   CT chest July 2020 showed stable sarcoid to mildly decreased.(Nodular opacities (stable mediastinal and bilateral hilar adenopathy  decreased.(Nodular opacities (stable mediastinal and bilateral hilar adenopathy.     PFTs done in July 2020 showed stable lung function with FEV1 at 70%, ratio 81, FVC 67% and DLCO was 61%.  This was similar to 2016  04/18/2022 Acute OV : Sarcoid, Bronchitis , O2 RF Patient presents for an acute office visit.  Patient complains of ongoing cough, congestion, wheezing and shortness of breath.  Patient was seen 3 days ago for 2-week history of cough, congestion, fever, discolored mucus.  Severe sore throat.  Last visit patient was started on Omnicef x 7 days and given a prednisone  taper.  Chest x-ray showed bronchitic changes.    She remains on oxygen 2 L at rest and 3 L with activity..  Patient says she is not getting any better.  Is coughing and keeping herself up at night.  She had been recommended to go to the emergency room as she was having severe shortness of breath.  Patient says she is absolutely not going to the hospital she refuses to be admitted.  Patient had a sick exposure with her grandson.  Followed by dermatology on Humira for cutaneous sarcoidosis.  She has been dealing with a eye infection over the last few months with significant visual impairment.  She has been referred to [facility] ophthalmology.  Patient has a history of recurrent PE.  On Xarelto 20 mg daily.  She denies any new bleeding.  Patient lives alone.  Is able to drive.  And remains independent.  She uses a rolling walker.  Allergies  Allergen Reactions   Belsomra [Suvorexant] Other (See Comments)    [place] out of bed while asleep: "I'm waking up as I'm falling on the floor;" "Night terrors"   Enalapril Maleate Cough   Latex Hives   Nickel Other (See Comments)    Blisters  Pt has a titanium right and left knee - nickel causes skin irritations that form into blisters and sores   Other Other (See Comments)    Patient has Sarcoidosis and can't tolerate any metals   Augmentin [Amoxicillin-Pot Clavulanate]     Purpura type rash  Morphine And Related Itching and Nausea Only   Doxycycline Diarrhea and Nausea And Vomiting   Hydrochlorothiazide Other (See Comments)    Low potassium levels    Hydroxychloroquine Sulfate Other (See Comments)    Vision changes    Lyrica [Pregabalin] Other (See Comments)    "Made me feel high"   Tape Other (See Comments)    Medical tape causes bruising    Immunization History  Administered Date(s) Administered   Fluad Quad(high Dose 65+) 10/15/2021   H1N1 12/24/2007   Influenza Split 10/11/2010   Influenza Whole 10/14/2007, 10/05/2008, 10/26/2009,  11/13/2011   Influenza,inj,Quad PF,6+ Mos 09/21/2012, 09/24/2013, 10/06/2014, 09/05/2015, 11/06/2016, 09/24/2017, 10/07/2018, 11/15/2019, 10/13/2020   Influenza,inj,Quad PF,6-35 Mos 09/14/2020   PFIZER(Purple Top)SARS-COV-2 Vaccination 03/31/2019, 04/21/2019, 11/27/2019   PNEUMOCOCCAL CONJUGATE-20 07/19/2020   PPD Test 11/18/2014   Pneumococcal Conjugate-13 07/30/2010, 12/29/2012   Pneumococcal Polysaccharide-23 12/23/2013   Tdap 06/28/2014    Past Medical History:  Diagnosis Date   ALLERGIC RHINITIS 10/26/2009   no current problems   Anxiety    B12 DEFICIENCY 08/25/2007   takes B12 supplement   Complication of anesthesia    pt has had difficulty following anesthesia with her knee in 2016-unable to care for herself afterward   Depression    takes effexor xr daily   Diabetes mellitus without complication    was on insulin but has been off since Nov 2015 and now only takes Metformin daily   DYSPNEA 04/28/2009   uses oxygen 24/7, on 2L via [place]   Esophageal reflux    takes Nexium daily   Fibromyalgia    Headache    last migraine 2-78yrs ago;takes Topamax daily   History of shingles    Hypertension    takes Coreg daily   Insomnia    takes Nortriptyline nightly    Long-term memory loss    OSA (obstructive sleep apnea)    doesn't use CPAP;sleep study in epic from 2006   Osteoarthritis    Panic attacks    PONV (postoperative nausea and vomiting)    on time in 2016 knee surgery   Rheumatoid arthritis    Sarcoidosis    Dr. Lolita Patella   Stroke    Vitamin D deficiency    is supposed to take Vit D but can't afford it    Tobacco History: Social History   Tobacco Use  Smoking Status Former   Packs/day: 0.50   Years: 10.00   Additional pack years: 0.00   Total pack years: 5.00   Types: Cigarettes   Start date: 1988   Quit date: 2004   Years since quitting: 20.2  Smokeless Tobacco Never  Tobacco Comments   quit smoking in 2004   Counseling given: Not Answered Tobacco  comments: quit smoking in 2004   Outpatient Medications Prior to Visit  Medication Sig Dispense Refill   acetaminophen (TYLENOL) 325 MG tablet Take 1-2 tablets (325-650 mg total) by mouth every 6 (six) hours as needed for mild pain (pain score 1-3 or temp > 100.5). 30 tablet 1   albuterol (VENTOLIN HFA) 108 (90 Base) MCG/ACT inhaler INHALE TWO puffs into THE lungs EVERY SIX HOURS AS NEEDED wheezind AND For SHORTNESS OF BREATH 6.7 g 3   B-D ULTRA-FINE 33 LANCETS MISC Use as instructed to test sugars once  daily 100 each 12   budesonide-formoterol (SYMBICORT) 160-4.5 MCG/ACT inhaler INHALE TWO puffs into THE lungs TWICE DAILY 10.2 g 5   carvedilol (COREG) 12.5 MG tablet Take 1 tablet (12.5 mg  total) by mouth 2 (two) times daily with a meal. 180 tablet 1   cefdinir (OMNICEF) 300 MG capsule Take 1 capsule (300 mg total) by mouth 2 (two) times daily. 14 capsule 0   Cholecalciferol (VITAMIN D-3) 125 MCG (5000 UT) TABS Take 5,000 Units by mouth daily.     Cyanocobalamin (VITAMIN B-12) 1000 MCG SUBL Place 1 tablet (1,000 mcg total) under the tongue daily. (Patient taking differently: Place 1,000 mcg under the tongue daily.) 100 tablet 3   dexlansoprazole (DEXILANT) 60 MG capsule TAKE ONE CAPSULE BY MOUTH EVERY DAY 90 capsule 1   Docusate Sodium (DSS) 100 MG CAPS Take by mouth.     gabapentin (NEURONTIN) 300 MG capsule TAKE ONE CAPSULE BY MOUTH EVERY MORNING **May take a second capsule during THE DAY AS NEEDED FOR PAIN 180 capsule 0   glucose blood (ACCU-CHEK GUIDE) test strip Use to check blood sugar once a day. 100 each 12   HUMIRA PEN 40 MG/0.4ML PNKT Inject 40 mg as directed every Wednesday.      HYDROmorphone (DILAUDID) 8 MG tablet Take 1 tablet (8 mg total) by mouth 2 (two) times daily as needed for severe pain. 60 tablet 0   hydrOXYzine (ATARAX) 25 MG tablet Take 1 tablet (25 mg total) by mouth every 8 (eight) hours as needed. 60 tablet 3   ketoconazole (NIZORAL) 2 % cream Apply 1 Application  topically 2 (two) times daily. Apply under breasts 45 g 1   lidocaine (XYLOCAINE) 2 % solution Use as directed 15 mLs in the mouth or throat every 6 (six) hours as needed for mouth pain. 100 mL 0   metFORMIN (GLUCOPHAGE-XR) 500 MG 24 hr tablet Take 2 tablets (1,000 mg total) by mouth at bedtime. Annual appt due in July must see provider for future refills 180 tablet 0   methocarbamol (ROBAXIN) 500 MG tablet Take 1 tablet (500 mg total) by mouth every 6 (six) hours as needed for muscle spasms. 30 tablet 0   Multiple Vitamins-Minerals (MULTIVITAMIN WOMEN 50+) TABS Take 1 tablet by mouth daily.     mupirocin ointment (BACTROBAN) 2 % On leg wound w/dressing change qd or bid 30 g 0   naloxone (NARCAN) nasal spray 4 mg/0.1 mL Place 1 spray into the nose once as needed (overdose).     naltrexone (DEPADE) 50 MG tablet Take 50 mg by mouth daily.     nystatin ointment (MYCOSTATIN) Apply 1 Application topically 2 (two) times daily. 100 g 0   ofloxacin (FLOXIN) 0.3 % OTIC solution      OXYGEN Inhale 2 L into the lungs continuous.     polyvinyl alcohol (LIQUIFILM TEARS) 1.4 % ophthalmic solution Place 1 drop into both eyes as needed for dry eyes. 15 mL 0   potassium chloride (KLOR-CON M) 10 MEQ tablet TAKE ONE TABLET BY MOUTH EVERY DAY 90 tablet 1   predniSONE (DELTASONE) 10 MG tablet 4 tabs for 2 days, then 3 tabs for 2 days, 2 tabs for 2 days, then 1 tab for 2 days, then stop 20 tablet 0   QUEtiapine (SEROQUEL) 50 MG tablet Take 1 tab in am and 2 tab at bedtime 90 tablet 5   rivaroxaban (XARELTO) 20 MG TABS tablet TAKE ONE TABLET BY MOUTH EVERY DAY 90 tablet 3   topiramate (TOPAMAX) 50 MG tablet TAKE ONE TABLET BY MOUTH TWICE DAILY 180 tablet 1   venlafaxine XR (EFFEXOR-XR) 75 MG 24 hr capsule Take 1 capsule (75 mg total) by mouth  daily with breakfast. 30 capsule 11   No facility-administered medications prior to visit.     Review of Systems:   Constitutional:   No  weight loss, night sweats,  Fevers,  chills,  +fatigue, or  lassitude.  HEENT:   No headaches,  Difficulty swallowing,  Tooth/dental problems, or                No sneezing, itching, ear ache,  +nasal congestion, post nasal drip,   CV:  No chest pain,  Orthopnea, PND, swelling in lower extremities, anasarca, dizziness, palpitations, syncope.   GI  No heartburn, indigestion, abdominal pain, nausea, vomiting, diarrhea, change in bowel habits, loss of appetite, bloody stools.   Resp:  No chest wall deformity  Skin: no rash or lesions.  GU: no dysuria, change in color of urine, no urgency or frequency.  No flank pain, no hematuria   MS:  No joint pain or swelling.  No decreased range of motion.  No back pain.    Physical Exam  BP 108/70 (BP Location: Left Arm, Patient Position: Sitting, Cuff Size: Large)   Pulse 79   Temp 98.2 F (36.8 C) (Oral)   Ht 5\' 7"  (1.702 m)   Wt 268 lb 6.4 oz (121.7 kg)   LMP 05/19/2003   BMI 42.04 kg/m   GEN: A/Ox3; pleasant , NAD, well nourished, elderly, oxygen, rolling walker   HEENT:  [place]/AT,  EACs-clear, TMs-wnl, NOSE-clear, THROAT-clear, no lesions, no postnasal drip or exudate noted.   NECK:  Supple w/ fair ROM; no JVD; normal carotid impulses w/o bruits; no thyromegaly or nodules palpated; no lymphadenopathy.    RESP  Clear  P & A; w/o, wheezes/ rales/ or rhonchi. no accessory muscle use, no dullness to percussion  CARD:  RRR, no m/r/g,tr peripheral edema, pulses intact, no cyanosis or clubbing.  GI:   Soft & nt; nml bowel sounds; no organomegaly or masses detected.   Musco: Warm bil, no deformities or joint swelling noted.   Neuro: alert, no focal deficits noted.    Skin: Warm, no lesions or rashes    Lab Results:  CBC    Component Value Date/Time   WBC 7.1 12/10/2021 1402   RBC 3.98 12/10/2021 1402   HGB 12.7 12/10/2021 1402   HGB 13.4 12/06/2013 0930   HCT 38.3 12/10/2021 1402   HCT 43.0 12/06/2013 0930   PLT 214.0 12/10/2021 1402   PLT 216 12/06/2013 0930    MCV 96.3 12/10/2021 1402   MCV 92.1 12/06/2013 0930   MCH 31.7 07/31/2021 1327   MCHC 33.2 12/10/2021 1402   RDW 14.1 12/10/2021 1402   RDW 14.5 12/06/2013 0930   LYMPHSABS 2.8 12/10/2021 1402   LYMPHSABS 1.7 12/06/2013 0930   MONOABS 0.6 12/10/2021 1402   MONOABS 0.6 12/06/2013 0930   EOSABS 0.4 12/10/2021 1402   EOSABS 0.1 12/06/2013 0930   BASOSABS 0.1 12/10/2021 1402   BASOSABS 0.0 12/06/2013 0930    BMET    Component Value Date/Time   NA 137 12/10/2021 1402   NA 143 08/27/2018 1540   NA 140 12/06/2013 0930   K 3.9 12/10/2021 1402   K 3.7 12/06/2013 0930   CL 103 12/10/2021 1402   CO2 28 12/10/2021 1402   CO2 24 12/06/2013 0930   GLUCOSE 104 (H) 12/10/2021 1402   GLUCOSE 110 12/06/2013 0930   BUN 13 12/10/2021 1402   BUN 18 08/27/2018 1540   BUN 11.0 12/06/2013 0930   CREATININE 0.94 12/10/2021 1402  CREATININE 1.1 12/06/2013 0930   CALCIUM 9.3 12/10/2021 1402   CALCIUM 9.4 12/06/2013 0930   GFRNONAA >60 07/31/2021 1327   GFRAA 82 08/27/2018 1540    BNP  Imaging: DG Chest 2 View  Result Date: 04/15/2022 CLINICAL DATA:  Cough EXAM: CHEST - 2 VIEW COMPARISON:  02/01/2021 FINDINGS: Cardiac size is within normal limits. There is peribronchial thickening. There is increase in interstitial markings in the parahilar regions and right lower lung field. There is no new focal consolidation. Small linear density in the lateral aspect of right upper lung field appears stable suggesting scarring. There is no pleural effusion or pneumothorax. IMPRESSION: Peribronchial thickening suggests bronchitis. There is prominence of interstitial markings in the parahilar regions and right lower lung fields with no significant interval change suggesting scarring. No new focal pulmonary consolidation is seen. There is no pleural effusion or pneumothorax. Electronically Signed   By: Elmer Picker M.D.   On: 04/15/2022 14:12    albuterol (PROVENTIL) (2.5 MG/3ML) 0.083% nebulizer  solution 2.5 mg     Date Action Dose Route User   04/15/2022 1449 Given 2.5 mg Nebulization Nolon Stalls, Kimber Relic, RN      albuterol (PROVENTIL) (2.5 MG/3ML) 0.083% nebulizer solution 2.5 mg     Date Action Dose Route User   04/18/2022 1509 Given 2.5 mg Nebulization Turci, Bonnie J, CMA      ketorolac (TORADOL) injection 60 mg     Date Action Dose Route User   03/12/2022 1436 Given 60 mg Intramuscular (Right Deltoid) Basil Dess, CMA      methylPREDNISolone acetate (DEPO-MEDROL) injection 80 mg     Date Action Dose Route User   03/12/2022 1437 Given 80 mg Intramuscular (Right Deltoid) Basil Dess, CMA      methylPREDNISolone acetate (DEPO-MEDROL) injection 120 mg     Date Action Dose Route User   04/18/2022 1445 Given 120 mg Intramuscular (Right Ventrogluteal) Donney Rankins, CMA          Latest Ref Rng & Units 07/27/2018   10:30 AM 12/30/2016    9:01 AM 05/18/2014   10:41 AM  PFT Results  FVC-Pre L 2.51  P 2.07  P 2.29   FVC-Predicted Pre % 67  P 54  P 61   FVC-Post L 2.51  P 2.18  P 2.43   FVC-Predicted Post % 67  P 57  P 65   Pre FEV1/FVC % % 79  P 70  P 69   Post FEV1/FCV % % 81  P 68  P 70   FEV1-Pre L 1.99  P 1.46  P 1.58   FEV1-Predicted Pre % 68  P 49  P 54   FEV1-Post L 2.03  P 1.49  P 1.70   DLCO uncorrected ml/min/mmHg 13.89  P 11.74  P 16.78   DLCO UNC% % 61  P 41  P 62   DLCO corrected ml/min/mmHg  11.34  P   DLCO COR %Predicted %  40  P   DLVA Predicted % 84  P 66  P 91   TLC L 4.27  P 4.07  P   TLC % Predicted % 77  P 74  P   RV % Predicted % 84  P 92  P     P Preliminary result    No results found for: "NITRICOXIDE"      Assessment & Plan:   COPD (chronic obstructive pulmonary disease) (HCC) Slow to resolve COPD exacerbation.  Patient has underlying  sarcoidosis and is on oxygen.  Discussed with patient in detail that she is high risk for decompensation.  Recommended further evaluation in the hospital but patient refuses.  Will  change Symbicort to [employer].  Depo-Medrol 120 IM injection today in the office.  Albuterol nebulizer given today.  Begin neb treatments at home.  Plan  Patient Instructions  Depo Medrol injection .  [employer] as directed.  Finish Prednisone taper over next week  Salt water gargles As needed   Tylenol As needed  for fever .  Hold Symbicort -Begin Breztri 2 puffs Twice daily   Albuterol inhaler As needed  or  Add Albuterol neb every 4-6hr as needed.  Continue on Xarelto 20mg  daily  Mucinex Twice daily  As needed  Cough/congestion  Delsym 2 tsp Twice daily for cough.  Continue on Oxygen 2l/m at rest and increase 3l/m with walking .  Activity as tolerated.  Work on healthy weight loss.  Follow up in 1 weeks and As needed   Follow up Dr. Elsworth Soho in 6-8 week with PFTs and As needed   Please contact office for sooner follow up if symptoms do not improve or worsen or seek emergency care      Chronic respiratory failure with hypoxia ([place]) Continue on oxygen to maintain O2 saturations greater than 88 to 90%  Pulmonary embolism (HCC) History of recurrent PE continue on Xarelto.    I spent  43  minutes dedicated to the care of this patient on the date of this encounter to include pre-visit review of records, face-to-face time with the patient discussing conditions above, post visit ordering of testing, clinical documentation with the electronic health record, making appropriate referrals as documented, and communicating necessary findings to members of the patients care team.   Rexene Edison, NP 04/18/2022

## 2022-04-18 NOTE — Progress Notes (Deleted)
Referring-Angela Plotnikov MD Reason for referral-chest pain  HPI: 62 year old female for evaluation of chest pain at request of Lew Dawes MD. 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  Echocardiogram November 2018 showed normal LV function, mild left ventricular hypertrophy, grade 1 diastolic dysfunction.   Current Outpatient Medications  Medication Sig Dispense Refill   acetaminophen (TYLENOL) 325 MG tablet Take 1-2 tablets (325-650 mg total) by mouth every 6 (six) hours as needed for mild pain (pain score 1-3 or temp > 100.5). 30 tablet 1   albuterol (PROVENTIL) (2.5 MG/3ML) 0.083% nebulizer solution Take 3 mLs (2.5 mg total) by nebulization every 6 (six) hours as needed for wheezing or shortness of breath. 75 mL 5   albuterol (VENTOLIN HFA) 108 (90 Base) MCG/ACT inhaler INHALE TWO puffs into THE lungs EVERY SIX HOURS AS NEEDED wheezind AND For SHORTNESS OF BREATH 6.7 g 3   B-D ULTRA-FINE 33 LANCETS MISC Use as instructed to test sugars once  daily 100 each 12   budesonide-formoterol (SYMBICORT) 160-4.5 MCG/ACT inhaler INHALE TWO puffs into THE lungs TWICE DAILY 10.2 g 5   carvedilol (COREG) 12.5 MG tablet Take 1 tablet (12.5 mg total) by mouth 2 (two) times daily with a meal. 180 tablet 1   cefdinir (OMNICEF) 300 MG capsule Take 1 capsule (300 mg total) by mouth 2 (two) times daily. 14 capsule 0   Cholecalciferol (VITAMIN D-3) 125 MCG (5000 UT) TABS Take 5,000 Units by mouth daily.     Cyanocobalamin (VITAMIN B-12) 1000 MCG SUBL Place 1 tablet (1,000 mcg total) under the tongue daily. (Patient taking differently: Place 1,000 mcg under the tongue daily.) 100 tablet 3   dexlansoprazole (DEXILANT) 60 MG capsule TAKE ONE CAPSULE BY MOUTH EVERY DAY 90 capsule 1   Docusate Sodium (DSS) 100 MG CAPS Take by mouth.     gabapentin (NEURONTIN) 300 MG capsule TAKE  ONE CAPSULE BY MOUTH EVERY MORNING **May take a second capsule during THE DAY AS NEEDED FOR PAIN 180 capsule 0   glucose blood (ACCU-CHEK GUIDE) test strip Use to check blood sugar once a day. 100 each 12   HUMIRA PEN 40 MG/0.4ML PNKT Inject 40 mg as directed every Wednesday.      HYDROmorphone (DILAUDID) 8 MG tablet Take 1 tablet (8 mg total) by mouth 2 (two) times daily as needed for severe pain. 60 tablet 0   hydrOXYzine (ATARAX) 25 MG tablet Take 1 tablet (25 mg total) by mouth every 8 (eight) hours as needed. 60 tablet 3   ketoconazole (NIZORAL) 2 % cream Apply 1 Application topically 2 (two) times daily. Apply under breasts 45 g 1   lidocaine (XYLOCAINE) 2 % solution Use as directed 15 mLs in the mouth or throat every 6 (six) hours as needed for mouth pain. 100 mL 0   metFORMIN (GLUCOPHAGE-XR) 500 MG 24 hr tablet Take 2 tablets (1,000 mg total) by mouth at bedtime. Annual appt due in July must see provider for future refills 180 tablet 0   methocarbamol (ROBAXIN) 500 MG tablet Take 1 tablet (500 mg total) by mouth every 6 (six) hours as needed for muscle spasms. 30 tablet 0   Multiple Vitamins-Minerals (MULTIVITAMIN WOMEN 50+) TABS Take 1 tablet by mouth daily.     mupirocin ointment (BACTROBAN) 2 % On leg wound w/dressing change qd or bid 30 g 0   naloxone (NARCAN) nasal  spray 4 mg/0.1 mL Place 1 spray into the nose once as needed (overdose).     naltrexone (DEPADE) 50 MG tablet Take 50 mg by mouth daily.     nystatin ointment (MYCOSTATIN) Apply 1 Application topically 2 (two) times daily. 100 g 0   ofloxacin (FLOXIN) 0.3 % OTIC solution      OXYGEN Inhale 2 L into the lungs continuous.     polyvinyl alcohol (LIQUIFILM TEARS) 1.4 % ophthalmic solution Place 1 drop into both eyes as needed for dry eyes. 15 mL 0   potassium chloride (KLOR-CON M) 10 MEQ tablet TAKE ONE TABLET BY MOUTH EVERY DAY 90 tablet 1   predniSONE (DELTASONE) 10 MG tablet 4 tabs for 2 days, then 3 tabs for 2 days, 2 tabs  for 2 days, then 1 tab for 2 days, then stop 20 tablet 0   QUEtiapine (SEROQUEL) 50 MG tablet Take 1 tab in am and 2 tab at bedtime 90 tablet 5   rivaroxaban (XARELTO) 20 MG TABS tablet TAKE ONE TABLET BY MOUTH EVERY DAY 90 tablet 3   topiramate (TOPAMAX) 50 MG tablet TAKE ONE TABLET BY MOUTH TWICE DAILY 180 tablet 1   venlafaxine XR (EFFEXOR-XR) 75 MG 24 hr capsule Take 1 capsule (75 mg total) by mouth daily with breakfast. 30 capsule 11   No current facility-administered medications for this visit.    Allergies  Allergen Reactions   Belsomra [Suvorexant] Other (See Comments)    [place] out of bed while asleep: "I'm waking up as I'm falling on the floor;" "Night terrors"   Enalapril Maleate Cough   Latex Hives   Nickel Other (See Comments)    Blisters  Pt has a titanium right and left knee - nickel causes skin irritations that form into blisters and sores   Other Other (See Comments)    Patient has Sarcoidosis and can't tolerate any metals   Augmentin [Amoxicillin-Pot Clavulanate]     Purpura type rash   Morphine And Related Itching and Nausea Only   Doxycycline Diarrhea and Nausea And Vomiting   Hydrochlorothiazide Other (See Comments)    Low potassium levels    Hydroxychloroquine Sulfate Other (See Comments)    Vision changes    Lyrica [Pregabalin] Other (See Comments)    "Made me feel high"   Tape Other (See Comments)    Medical tape causes bruising     Past Medical History:  Diagnosis Date   ALLERGIC RHINITIS 10/26/2009   no current problems   Anxiety    B12 DEFICIENCY 08/25/2007   takes B12 supplement   Complication of anesthesia    pt has had difficulty following anesthesia with her knee in 2016-unable to care for herself afterward   Depression    takes effexor xr daily   Diabetes mellitus without complication    was on insulin but has been off since Nov 2015 and now only takes Metformin daily   DYSPNEA 04/28/2009   uses oxygen 24/7, on 2L via [place]   Esophageal  reflux    takes Nexium daily   Fibromyalgia    Headache    last migraine 2-60yrs ago;takes Topamax daily   History of shingles    Hypertension    takes Coreg daily   Insomnia    takes Nortriptyline nightly    Long-term memory loss    OSA (obstructive sleep apnea)    doesn't use CPAP;sleep study in epic from 2006   Osteoarthritis    Panic attacks  PONV (postoperative nausea and vomiting)    on time in 2016 knee surgery   Rheumatoid arthritis    Sarcoidosis    Dr. Lolita Patella   Stroke    Vitamin D deficiency    is supposed to take Vit D but can't afford it    Past Surgical History:  Procedure Laterality Date   APPENDECTOMY     arthroscopic knee surgery Right 11-12-04   AXILLARY ABCESS IRRIGATION AND DEBRIDEMENT  Jul & Aug2012   BIOPSY  11/02/2019   Procedure: BIOPSY;  Surgeon: Jerene Bears, MD;  Location: [facility];  Service: Gastroenterology;;   CARPAL TUNNEL RELEASE Left 05/23/2014   Procedure: CARPAL TUNNEL RELEASE;  Surgeon: Meredith Pel, MD;  Location: [place];  Service: Orthopedics;  Laterality: Left;   CHOLECYSTECTOMY N/A 02/11/2018   Procedure: LAPAROSCOPIC CHOLECYSTECTOMY WITH INTRAOPERATIVE CHOLANGIOGRAM ERAS PATHWAY;  Surgeon: Donnie Mesa, MD;  Location: [place];  Service: General;  Laterality: N/A;   COLONOSCOPY WITH PROPOFOL N/A 11/02/2019   Procedure: COLONOSCOPY WITH PROPOFOL;  Surgeon: Jerene Bears, MD;  Location: [facility];  Service: Gastroenterology;  Laterality: N/A;   cyst removed from top of buttocks  at age 51   DACRORHINOCYSTOTOMY Bilateral 03/23/2021   Procedure: ENDOSCOPIC DACROCYSTORHINOSTOMY;  Surgeon: Delia Chimes, MD;  Location: [place];  Service: Ophthalmology;  Laterality: Bilateral;   ENDOMETRIAL ABLATION     IR CHOLANGIOGRAM EXISTING TUBE  11/21/2017   IR CHOLANGIOGRAM EXISTING TUBE  01/16/2018   IR EXCHANGE BILIARY DRAIN  11/10/2017   IR PATIENT EVAL TECH 0-60 MINS  02/03/2018   IR PERC CHOLECYSTOSTOMY  09/13/2017   IR RADIOLOGIST  EVAL & MGMT  10/08/2017   LACRIMAL DUCT EXPLORATION Right 06/26/2017   Procedure: LACRIMAL DUCT EXPLORATION AND ETHMOIDECTOMY;  Surgeon: Clista Bernhardt, MD;  Location: [place];  Service: Ophthalmology;  Laterality: Right;   LACRIMAL DUCT EXPLORATION Bilateral 03/23/2021   Procedure: NASAL LACRIMAL DUCT EXPLORATION;  Surgeon: Delia Chimes, MD;  Location: [place];  Service: Ophthalmology;  Laterality: Bilateral;   LACRIMAL TUBE INSERTION Bilateral 03/23/2021   Procedure: INSERTION OF CRAWFORD LACRIMAL STENT;  Surgeon: Delia Chimes, MD;  Location: [place];  Service: Ophthalmology;  Laterality: Bilateral;   ORIF FEMUR FRACTURE Left 07/31/2021   Procedure: left medial femoral condyle fracture fixation;  Surgeon: Meredith Pel, MD;  Location: [place];  Service: Orthopedics;  Laterality: Left;   POLYPECTOMY  11/02/2019   Procedure: POLYPECTOMY;  Surgeon: Jerene Bears, MD;  Location: [facility];  Service: Gastroenterology;;   TEAR DUCT PROBING Right 06/26/2017   Procedure: TEAR DUCT PROBING WITH STENT;  Surgeon: Clista Bernhardt, MD;  Location: [place];  Service: Ophthalmology;  Laterality: Right;   TOTAL KNEE ARTHROPLASTY Right 11/15/2014   Procedure: TOTAL RIGHT KNEE ARTHROPLASTY;  Surgeon: Meredith Pel, MD;  Location: [place];  Service: Orthopedics;  Laterality: Right;   TOTAL KNEE ARTHROPLASTY Left 07/13/2015   Procedure: LEFT TOTAL KNEE ARTHROPLASTY;  Surgeon: Meredith Pel, MD;  Location: [place];  Service: Orthopedics;  Laterality: Left;    Social History   Socioeconomic History   Marital status: Single    Spouse name: Not on file   Number of children: 1   Years of education: Not on file   Highest education level: Not on file  Occupational History   Occupation: disabled  Tobacco Use   Smoking status: Former    Packs/day: 0.50    Years: 10.00    Additional pack years: 0.00  Total pack years: 5.00    Types: Cigarettes    Start date: 86    Quit date: 2004    Years  since quitting: 20.2   Smokeless tobacco: Never   Tobacco comments:    quit smoking in 2004  Vaping Use   Vaping Use: Never used  Substance and Sexual Activity   Alcohol use: Not Currently    Alcohol/week: 1.0 standard drink of alcohol    Types: 1 Glasses of wine per week   Drug use: No   Sexual activity: Not Currently    Birth control/protection: Post-menopausal  Other Topics Concern   Not on file  Social History Narrative   Single, broke up with partner in 2008.      Lives at home alone   Caffeine: stopped 2009   Social Determinants of Health   Financial Resource Strain: Low Risk  (04/23/2021)   Overall Financial Resource Strain (CARDIA)    Difficulty of Paying Living Expenses: Not hard at all  Food Insecurity: No Food Insecurity (08/21/2021)   Hunger Vital Sign    Worried About Running Out of Food in the Last Year: Never true    Ran Out of Food in the Last Year: Never true  Transportation Needs: No Transportation Needs (08/21/2021)   PRAPARE - Hydrologist (Medical): No    Lack of Transportation (Non-Medical): No  Physical Activity: Inactive (04/23/2021)   Exercise Vital Sign    Days of Exercise per Week: 0 days    Minutes of Exercise per Session: 0 min  Stress: No Stress Concern Present (04/23/2021)   [place]    Feeling of Stress : Not at all  Social Connections: Socially Isolated (04/23/2021)   Social Connection and Isolation Panel [NHANES]    Frequency of Communication with Friends and Family: More than three times a week    Frequency of Social Gatherings with Friends and Family: Never    Attends Religious Services: Never    Marine scientist or Organizations: No    Attends Archivist Meetings: Never    Marital Status: Never married  Intimate Partner Violence: Not At Risk (04/23/2021)   Humiliation, Afraid, Rape, and Kick questionnaire    Fear of Current or  Ex-Partner: No    Emotionally Abused: No    Physically Abused: No    Sexually Abused: No    Family History  Problem Relation Age of Onset   Heart disease Mother    Kidney disease Mother        renal failure   Cancer Father        leukemia   Hypertension Other    Coronary artery disease Other        female 1st degree relative <60   Heart failure Other        congestive   Coronary artery disease Other        Female 1st degree relative <50   Breast cancer Other        1st degree relative <50 S   Breast cancer Sister    Colon cancer Brother 31   Stroke Brother    Heart attack Brother    Cancer Brother 56       colon ca   Esophageal cancer Neg Hx    Stomach cancer Neg Hx    Rectal cancer Neg Hx     ROS: no fevers or chills, productive cough, hemoptysis, dysphasia, odynophagia,  melena, hematochezia, dysuria, hematuria, rash, seizure activity, orthopnea, PND, pedal edema, claudication. Remaining systems are negative.  Physical Exam:   Last menstrual period 05/19/2003.  General:  Well developed/well nourished in NAD Skin warm/dry Patient not depressed No peripheral clubbing Back-normal HEENT-normal/normal eyelids Neck supple/normal carotid upstroke bilaterally; no bruits; no JVD; no thyromegaly chest - CTA/ normal expansion CV - RRR/normal S1 and S2; no murmurs, rubs or gallops;  PMI nondisplaced Abdomen -NT/ND, no HSM, no mass, + bowel sounds, no bruit 2+ femoral pulses, no bruits Ext-no edema, chords, 2+ DP Neuro-grossly nonfocal  ECG - personally reviewed  A/P  1 chest pain-  2 sarcoid-  3 hypertension-  4 obstructive sleep apnea-  [name], MD

## 2022-04-18 NOTE — Assessment & Plan Note (Signed)
Continue on oxygen to maintain O2 saturations greater than 88 to 90%. 

## 2022-04-18 NOTE — Assessment & Plan Note (Signed)
History of recurrent PE continue on Xarelto.

## 2022-04-18 NOTE — Telephone Encounter (Signed)
[name] told her to call if she was not feeling better. She is even worse, she said. Pls call to advise @ [phone]  Panicking due to SOB. Coughing up brown.

## 2022-04-18 NOTE — Telephone Encounter (Signed)
Will need to come in or go to ER if she is worse. Was she able to restart symbicort.  Is she taking albuterol nebs  Can  double book at 130 Please contact office for sooner follow up if symptoms do not improve or worsen or seek emergency care

## 2022-04-18 NOTE — Telephone Encounter (Signed)
Spoke with patient she states she is taking nebs and symbicort inhaler. She did not want to go to the ER. I have her scheduled today to see you at 1:30  [facility]

## 2022-04-18 NOTE — Telephone Encounter (Signed)
Called patient and she states that she saw Tammy on monday and she is feeling worse. She states she is starting to worry now because she is coughing so much and feels like she can not breath sometimes. She states she is coughing up thick brown mucus.   She is taking OTC:  Tylenol  Mucinex  Please advise Tammy

## 2022-04-18 NOTE — Assessment & Plan Note (Signed)
Slow to resolve COPD exacerbation.  Patient has underlying sarcoidosis and is on oxygen.  Discussed with patient in detail that she is high risk for decompensation.  Recommended further evaluation in the hospital but patient refuses.  Will change Symbicort to [employer].  Depo-Medrol 120 IM injection today in the office.  Albuterol nebulizer given today.  Begin neb treatments at home.  Plan  Patient Instructions  Depo Medrol injection .  [employer] as directed.  Finish Prednisone taper over next week  Salt water gargles As needed   Tylenol As needed  for fever .  Hold Symbicort -Begin Breztri 2 puffs Twice daily   Albuterol inhaler As needed  or  Add Albuterol neb every 4-6hr as needed.  Continue on Xarelto 20mg  daily  Mucinex Twice daily  As needed  Cough/congestion  Delsym 2 tsp Twice daily for cough.  Continue on Oxygen 2l/m at rest and increase 3l/m with walking .  Activity as tolerated.  Work on healthy weight loss.  Follow up in 1 weeks and As needed   Follow up Dr. Elsworth Soho in 6-8 week with PFTs and As needed   Please contact office for sooner follow up if symptoms do not improve or worsen or seek emergency care

## 2022-04-18 NOTE — Patient Instructions (Addendum)
Depo Medrol injection .  [employer] as directed.  Finish Prednisone taper over next week  Salt water gargles As needed   Tylenol As needed  for fever .  Hold Symbicort -Begin Breztri 2 puffs Twice daily   Albuterol inhaler As needed  or  Add Albuterol neb every 4-6hr as needed.  Continue on Xarelto 20mg  daily  Mucinex Twice daily  As needed  Cough/congestion  Delsym 2 tsp Twice daily for cough.  Continue on Oxygen 2l/m at rest and increase 3l/m with walking .  Activity as tolerated.  Work on healthy weight loss.  Follow up in 1 weeks and As needed   Follow up Dr. Elsworth Soho in 6-8 week with PFTs and As needed   Please contact office for sooner follow up if symptoms do not improve or worsen or seek emergency care

## 2022-04-19 ENCOUNTER — Ambulatory Visit: Payer: 59 | Admitting: Surgical

## 2022-04-19 ENCOUNTER — Other Ambulatory Visit: Payer: Self-pay | Admitting: Internal Medicine

## 2022-04-19 NOTE — Telephone Encounter (Signed)
Pt called in bc she doesn't want to go the weekend without her neb sent over

## 2022-04-22 DIAGNOSIS — J449 Chronic obstructive pulmonary disease, unspecified: Secondary | ICD-10-CM | POA: Diagnosis not present

## 2022-04-23 ENCOUNTER — Telehealth: Payer: Self-pay | Admitting: Internal Medicine

## 2022-04-23 MED ORDER — METFORMIN HCL ER 500 MG PO TB24: 1000.0000 mg | ORAL_TABLET | Freq: Every day | ORAL | 1 refills | Status: DC

## 2022-04-23 NOTE — Telephone Encounter (Signed)
Prescription Request  04/23/2022  LOV: 04/09/2022  What is the name of the medication or equipment? Metformin     Patient is completely out of medication today.  Have you contacted your pharmacy to request a refill? Yes   Which pharmacy would you like this sent to?   [id number] [place], [place] - [phone] AT [facility] & [address] [place] [address] Phone: [phone] Fax: [phone]    Patient notified that their request is being sent to the clinical staff for review and that they should receive a response within 2 business days.   Please advise at Mobile [phone] (mobile)

## 2022-04-23 NOTE — Telephone Encounter (Signed)
Sent refill to [facility]../lmb 

## 2022-04-24 ENCOUNTER — Telehealth: Payer: Self-pay | Admitting: Internal Medicine

## 2022-04-24 NOTE — Telephone Encounter (Signed)
Patient called and said her pharmacy and several other pharmacies do not current;y have  HYDROmorphone (DILAUDID) 8 MG tablet . She said her insurance will only cover hydromorphone, morphine, and fentanyl. She would like to know what Dr. Posey Rea would recommend she do. Best callback is [phone].

## 2022-04-25 ENCOUNTER — Ambulatory Visit: Payer: 59 | Admitting: Cardiology

## 2022-04-25 NOTE — Telephone Encounter (Signed)
Called pt gave her MD response. Pt states the manufacturer is on backorder with no release date. She have tried the 3 [facility] and [facility] but they are out.Marland KitchenRaechel Chute

## 2022-04-25 NOTE — Telephone Encounter (Signed)
We can send her prescription to a different pharmacy.  Thanks

## 2022-04-26 ENCOUNTER — Ambulatory Visit
Admission: RE | Admit: 2022-04-26 | Discharge: 2022-04-26 | Disposition: A | Discharge status: Home / Self Care | Payer: 59 | Source: Ambulatory Visit | Attending: Internal Medicine | Admitting: Internal Medicine

## 2022-04-26 DIAGNOSIS — Z1231 Encounter for screening mammogram for malignant neoplasm of breast: Secondary | ICD-10-CM | POA: Diagnosis not present

## 2022-04-26 NOTE — Unmapped (Signed)
[facility] Refill Coordination Note    Specialty Medication(s) to be Shipped:   Inflammatory Disorders: Cosentyx    Other medication(s) to be shipped: No additional medications requested for fill at this time     [facility], DOB: [birth year]  Phone: [phone] (home)       All above HIPAA information was verified with patient.     Was a Nurse, learning disability used for this call? No    Completed refill call assessment today to schedule patient's medication shipment from the [facility] [phone]).  All relevant notes have been reviewed.     Specialty medication(s) and dose(s) confirmed: Regimen is correct and unchanged.   Changes to medications: [facility] reports no changes at this time.  Changes to insurance: No  New side effects reported not previously addressed with a pharmacist or physician: None reported  Questions for the pharmacist: No    Confirmed patient received a Conservation officer, historic buildings and a Surveyor, mining with first shipment. The patient will receive a drug information handout for each medication shipped and additional FDA Medication Guides as required.       DISEASE/MEDICATION-SPECIFIC INFORMATION        For patients on injectable medications: Patient currently has 2 doses left.  Next injection is scheduled for 4/17.    SPECIALTY MEDICATION ADHERENCE     Medication Adherence    Patient reported X missed doses in the last month: 0  Specialty Medication: HUMIRA PEN CITRATE FREE 40 MG/0.4 ML  Patient is on additional specialty medications: No  Informant: patient                Were doses missed due to medication being on hold? No    Humira 40/0.4 mg/ml: 12 days of medicine on hand       REFERRAL TO PHARMACIST     Referral to the pharmacist: Not needed      [facility]     Shipping address confirmed in Epic.     Patient was notified of new phone menu : Yes: .    Delivery Scheduled: Yes, Expected medication delivery date: 4/18.     Medication will be delivered via UPS to the prescription address in Epic WAM.    [facility]   [facility] Specialty Technician

## 2022-04-29 ENCOUNTER — Ambulatory Visit (INDEPENDENT_AMBULATORY_CARE_PROVIDER_SITE_OTHER): Payer: 59

## 2022-04-29 VITALS — Ht 67.0 in | Wt 268.0 lb

## 2022-04-29 DIAGNOSIS — Z0000 Encounter for general adult medical examination without abnormal findings: Secondary | ICD-10-CM

## 2022-04-29 NOTE — Progress Notes (Cosign Needed Addendum)
I connected with  [name] on 04/29/22 by a audio enabled telemedicine application and verified that I am speaking with the correct person using two identifiers.  Patient Location: Home  Provider Location: Office/Clinic  I discussed the limitations of evaluation and management by telemedicine. The patient expressed understanding and agreed to proceed.  Subjective:   Lucindy Borel is a 62 y.o. female who presents for Medicare Annual (Subsequent) preventive examination.  Review of Systems      Cardiac Risk Factors include: advanced age (>41men, >38 women);sedentary lifestyle;hypertension;diabetes mellitus     Objective:    Today's Vitals   04/29/22 1522 04/29/22 1523  Weight: 268 lb (121.6 kg)   Height: 5\' 7"  (1.702 m)   PainSc:  9    Body mass index is 41.97 kg/m.     04/29/2022    3:37 PM 08/01/2021    1:15 AM 08/01/2021    1:00 AM 04/23/2021    1:58 PM 03/23/2021    6:25 AM 03/21/2021    2:18 PM 08/19/2020    9:48 AM  Advanced Directives  Does Patient Have a Medical Advance Directive? Yes  No No No No No  Type of Estate agent of [place];Living will        Copy of Healthcare Power of Attorney in Chart? No - copy requested        Would patient like information on creating a medical advance directive?  No - Patient declined  Yes (MAU/Ambulatory/Procedural Areas - Information given) No - Patient declined No - Patient declined     Current Medications (verified) Outpatient Encounter Medications as of 04/29/2022  Medication Sig   acetaminophen (TYLENOL) 325 MG tablet Take 1-2 tablets (325-650 mg total) by mouth every 6 (six) hours as needed for mild pain (pain score 1-3 or temp > 100.5).   albuterol (PROVENTIL) (2.5 MG/3ML) 0.083% nebulizer solution Take 3 mLs (2.5 mg total) by nebulization every 6 (six) hours as needed for wheezing or shortness of breath.   albuterol (VENTOLIN HFA) 108 (90 Base) MCG/ACT inhaler INHALE TWO puffs  into THE lungs EVERY SIX HOURS AS NEEDED wheezind AND For SHORTNESS OF BREATH   B-D ULTRA-FINE 33 LANCETS MISC Use as instructed to test sugars once  daily   budesonide-formoterol (SYMBICORT) 160-4.5 MCG/ACT inhaler INHALE TWO puffs into THE lungs TWICE DAILY   carvedilol (COREG) 12.5 MG tablet Take 1 tablet (12.5 mg total) by mouth 2 (two) times daily with a meal.   Cholecalciferol (VITAMIN D-3) 125 MCG (5000 UT) TABS Take 5,000 Units by mouth daily.   Cyanocobalamin (VITAMIN B-12) 1000 MCG SUBL Place 1 tablet (1,000 mcg total) under the tongue daily. (Patient taking differently: Place 1,000 mcg under the tongue daily.)   dexlansoprazole (DEXILANT) 60 MG capsule TAKE ONE CAPSULE BY MOUTH EVERY DAY   gabapentin (NEURONTIN) 300 MG capsule TAKE ONE CAPSULE BY MOUTH EVERY MORNING **May take a second capsule during THE DAY AS NEEDED FOR PAIN   glucose blood (ACCU-CHEK GUIDE) test strip Use to check blood sugar once a day.   HUMIRA PEN 40 MG/0.4ML PNKT Inject 40 mg as directed every Wednesday.    HYDROmorphone (DILAUDID) 8 MG tablet Take 1 tablet (8 mg total) by mouth 2 (two) times daily as needed for severe pain.   hydrOXYzine (ATARAX) 25 MG tablet Take 1 tablet (25 mg total) by mouth every 8 (eight) hours as needed.   metFORMIN (GLUCOPHAGE-XR) 500 MG 24 hr tablet Take 2 tablets (1,000 mg total)  by mouth at bedtime.   Multiple Vitamins-Minerals (MULTIVITAMIN WOMEN 50+) TABS Take 1 tablet by mouth daily.   naloxone (NARCAN) nasal spray 4 mg/0.1 mL Place 1 spray into the nose once as needed (overdose).   naltrexone (DEPADE) 50 MG tablet Take 50 mg by mouth daily.   OXYGEN Inhale 2 L into the lungs continuous.   potassium chloride (KLOR-CON M) 10 MEQ tablet TAKE ONE TABLET BY MOUTH EVERY DAY   QUEtiapine (SEROQUEL) 50 MG tablet Take 1 tab in am and 2 tab at bedtime   rivaroxaban (XARELTO) 20 MG TABS tablet TAKE ONE TABLET BY MOUTH EVERY DAY   topiramate (TOPAMAX) 50 MG tablet TAKE ONE TABLET BY MOUTH  TWICE DAILY   venlafaxine XR (EFFEXOR-XR) 75 MG 24 hr capsule Take 1 capsule (75 mg total) by mouth daily with breakfast.   cefdinir (OMNICEF) 300 MG capsule Take 1 capsule (300 mg total) by mouth 2 (two) times daily. (Patient not taking: Reported on 04/29/2022)   Docusate Sodium (DSS) 100 MG CAPS Take by mouth. (Patient not taking: Reported on 04/29/2022)   ketoconazole (NIZORAL) 2 % cream Apply 1 Application topically 2 (two) times daily. Apply under breasts (Patient not taking: Reported on 04/29/2022)   lidocaine (XYLOCAINE) 2 % solution Use as directed 15 mLs in the mouth or throat every 6 (six) hours as needed for mouth pain. (Patient not taking: Reported on 04/29/2022)   methocarbamol (ROBAXIN) 500 MG tablet Take 1 tablet (500 mg total) by mouth every 6 (six) hours as needed for muscle spasms. (Patient not taking: Reported on 04/29/2022)   mupirocin ointment (BACTROBAN) 2 % On leg wound w/dressing change qd or bid (Patient not taking: Reported on 04/29/2022)   nystatin ointment (MYCOSTATIN) Apply 1 Application topically 2 (two) times daily. (Patient not taking: Reported on 04/29/2022)   ofloxacin (FLOXIN) 0.3 % OTIC solution  (Patient not taking: Reported on 04/29/2022)   polyvinyl alcohol (LIQUIFILM TEARS) 1.4 % ophthalmic solution Place 1 drop into both eyes as needed for dry eyes. (Patient not taking: Reported on 04/29/2022)   predniSONE (DELTASONE) 10 MG tablet 4 tabs for 2 days, then 3 tabs for 2 days, 2 tabs for 2 days, then 1 tab for 2 days, then stop (Patient not taking: Reported on 04/29/2022)   No facility-administered encounter medications on file as of 04/29/2022.    Allergies (verified) Belsomra [suvorexant], Enalapril maleate, Latex, Nickel, Other, Augmentin [amoxicillin-pot clavulanate], Morphine and related, Doxycycline, Hydrochlorothiazide, Hydroxychloroquine sulfate, Lyrica [pregabalin], and Tape   History: Past Medical History:  Diagnosis Date   ALLERGIC RHINITIS 10/26/2009   no  current problems   Anxiety    B12 DEFICIENCY 08/25/2007   takes B12 supplement   Complication of anesthesia    pt has had difficulty following anesthesia with her knee in 2016-unable to care for herself afterward   Depression    takes effexor xr daily   Diabetes mellitus without complication    was on insulin but has been off since Nov 2015 and now only takes Metformin daily   DYSPNEA 04/28/2009   uses oxygen 24/7, on 2L via [place]   Esophageal reflux    takes Nexium daily   Fibromyalgia    Headache    last migraine 2-26yrs ago;takes Topamax daily   History of shingles    Hypertension    takes Coreg daily   Insomnia    takes Nortriptyline nightly    Long-term memory loss    OSA (obstructive sleep apnea)  doesn't use CPAP;sleep study in epic from 2006   Osteoarthritis    Panic attacks    PONV (postoperative nausea and vomiting)    on time in 2016 knee surgery   Rheumatoid arthritis    Sarcoidosis    Dr. Wilford Grist   Stroke    Vitamin D deficiency    is supposed to take Vit D but can't afford it   Past Surgical History:  Procedure Laterality Date   APPENDECTOMY     arthroscopic knee surgery Right 11-12-04   AXILLARY ABCESS IRRIGATION AND DEBRIDEMENT  Jul & [id number]   BIOPSY  11/02/2019   Procedure: BIOPSY;  Surgeon: Beverley Fiedler, MD;  Location: [facility];  Service: Gastroenterology;;   Fidela Salisbury RELEASE Left 05/23/2014   Procedure: CARPAL TUNNEL RELEASE;  Surgeon: Cammy Copa, MD;  Location: [facility];  Service: Orthopedics;  Laterality: Left;   CHOLECYSTECTOMY N/A 02/11/2018   Procedure: LAPAROSCOPIC CHOLECYSTECTOMY WITH INTRAOPERATIVE CHOLANGIOGRAM ERAS PATHWAY;  Surgeon: Manus Rudd, MD;  Location: [facility] OR;  Service: General;  Laterality: N/A;   COLONOSCOPY WITH PROPOFOL N/A 11/02/2019   Procedure: COLONOSCOPY WITH PROPOFOL;  Surgeon: Beverley Fiedler, MD;  Location: [facility];  Service: Gastroenterology;  Laterality: N/A;   cyst removed from top of buttocks  at  age 56   DACRORHINOCYSTOTOMY Bilateral 03/23/2021   Procedure: ENDOSCOPIC DACROCYSTORHINOSTOMY;  Surgeon: Dairl Ponder, MD;  Location: [facility] OR;  Service: Ophthalmology;  Laterality: Bilateral;   ENDOMETRIAL ABLATION     IR CHOLANGIOGRAM EXISTING TUBE  11/21/2017   IR CHOLANGIOGRAM EXISTING TUBE  01/16/2018   IR EXCHANGE BILIARY DRAIN  11/10/2017   IR PATIENT EVAL TECH 0-60 MINS  02/03/2018   IR PERC CHOLECYSTOSTOMY  09/13/2017   IR RADIOLOGIST EVAL & MGMT  10/08/2017   LACRIMAL DUCT EXPLORATION Right 06/26/2017   Procedure: LACRIMAL DUCT EXPLORATION AND ETHMOIDECTOMY;  Surgeon: Floydene Flock, MD;  Location: [facility] OR;  Service: Ophthalmology;  Laterality: Right;   LACRIMAL DUCT EXPLORATION Bilateral 03/23/2021   Procedure: NASAL LACRIMAL DUCT EXPLORATION;  Surgeon: Dairl Ponder, MD;  Location: [facility] OR;  Service: Ophthalmology;  Laterality: Bilateral;   LACRIMAL TUBE INSERTION Bilateral 03/23/2021   Procedure: INSERTION OF CRAWFORD LACRIMAL STENT;  Surgeon: Dairl Ponder, MD;  Location: [facility] OR;  Service: Ophthalmology;  Laterality: Bilateral;   ORIF FEMUR FRACTURE Left 07/31/2021   Procedure: left medial femoral condyle fracture fixation;  Surgeon: Cammy Copa, MD;  Location: [facility] OR;  Service: Orthopedics;  Laterality: Left;   POLYPECTOMY  11/02/2019   Procedure: POLYPECTOMY;  Surgeon: Beverley Fiedler, MD;  Location: [facility];  Service: Gastroenterology;;   TEAR DUCT PROBING Right 06/26/2017   Procedure: TEAR DUCT PROBING WITH STENT;  Surgeon: Floydene Flock, MD;  Location: [facility] OR;  Service: Ophthalmology;  Laterality: Right;   TOTAL KNEE ARTHROPLASTY Right 11/15/2014   Procedure: TOTAL RIGHT KNEE ARTHROPLASTY;  Surgeon: Cammy Copa, MD;  Location: [facility] OR;  Service: Orthopedics;  Laterality: Right;   TOTAL KNEE ARTHROPLASTY Left 07/13/2015   Procedure: LEFT TOTAL KNEE ARTHROPLASTY;  Surgeon: Cammy Copa, MD;  Location: [facility] OR;  Service: Orthopedics;  Laterality: Left;    Family History  Problem Relation Age of Onset   Heart disease Mother    Kidney disease Mother        renal failure   Cancer Father        leukemia   Hypertension Other    Coronary artery disease Other  female 1st degree relative <60   Heart failure Other        congestive   Coronary artery disease Other        Female 1st degree relative <50   Breast cancer Other        1st degree relative <50 S   Breast cancer Sister    Colon cancer Brother 39   Stroke Brother    Heart attack Brother    Cancer Brother 37       colon ca   Esophageal cancer Neg Hx    Stomach cancer Neg Hx    Rectal cancer Neg Hx    Social History   Socioeconomic History   Marital status: Single    Spouse name: Not on file   Number of children: 1   Years of education: Not on file   Highest education level: Not on file  Occupational History   Occupation: disabled  Tobacco Use   Smoking status: Former    Packs/day: 0.50    Years: 10.00    Additional pack years: 0.00    Total pack years: 5.00    Types: Cigarettes    Start date: 82    Quit date: 2004    Years since quitting: 20.3   Smokeless tobacco: Never   Tobacco comments:    quit smoking in 2004  Vaping Use   Vaping Use: Never used  Substance and Sexual Activity   Alcohol use: Not Currently    Alcohol/week: 1.0 standard drink of alcohol    Types: 1 Glasses of wine per week   Drug use: No   Sexual activity: Not Currently    Birth control/protection: Post-menopausal  Other Topics Concern   Not on file  Social History Narrative   Single, broke up with partner in 2008.      Lives at home alone   Caffeine: stopped 2009   Social Determinants of Health   Financial Resource Strain: Low Risk  (04/29/2022)   Overall Financial Resource Strain (CARDIA)    Difficulty of Paying Living Expenses: Not hard at all  Food Insecurity: No Food Insecurity (04/29/2022)   Hunger Vital Sign    Worried About Running Out of Food in the Last Year:  Never true    Ran Out of Food in the Last Year: Never true  Transportation Needs: No Transportation Needs (04/29/2022)   PRAPARE - Administrator, Civil Service (Medical): No    Lack of Transportation (Non-Medical): No  Physical Activity: Inactive (04/29/2022)   Exercise Vital Sign    Days of Exercise per Week: 0 days    Minutes of Exercise per Session: 0 min  Stress: No Stress Concern Present (04/29/2022)   [employer] of Occupational Health - Occupational Stress Questionnaire    Feeling of Stress : Not at all  Social Connections: Socially Isolated (04/29/2022)   Social Connection and Isolation Panel [NHANES]    Frequency of Communication with Friends and Family: Once a week    Frequency of Social Gatherings with Friends and Family: Once a week    Attends Religious Services: Never    Database administrator or Organizations: No    Attends Engineer, structural: Never    Marital Status: Never married    Tobacco Counseling Counseling given: Not Answered Tobacco comments: quit smoking in 2004   Clinical Intake:  Pre-visit preparation completed: Yes  Pain : 0-10 Pain Score: 9  Pain Type: Chronic pain Pain Location: Back Pain Orientation:  Lower, Mid Pain Descriptors / Indicators: Sharp, Aching Pain Frequency: Constant     Nutritional Risks: None Diabetes: Yes CBG done?: Yes (119 per pt) CBG resulted in Enter/ Edit results?: No Did pt. bring in CBG monitor from home?: No  How often do you need to have someone help you when you read instructions, pamphlets, or other written materials from your doctor or pharmacy?: 1 - Never  Diabetic?Nutrition Risk Assessment:  Has the patient had any N/V/D within the last 2 months?  No  Does the patient have any non-healing wounds?  No  Has the patient had any unintentional weight loss or weight gain?  No   Diabetes:  Is the patient diabetic?  Yes  If diabetic, was a CBG obtained today?  Yes , 119 per pt Did  the patient bring in their glucometer from home?  No  How often do you monitor your CBG's? qd.   Financial Strains and Diabetes Management:  Are you having any financial strains with the device, your supplies or your medication? No .  Does the patient want to be seen by Chronic Care Management for management of their diabetes?  No  Would the patient like to be referred to a Nutritionist or for Diabetic Management?  No   Diabetic Exams:  Diabetic Eye Exam: Completed [facility]  03/14/22 Diabetic Foot Exam: Completed [facility] 2023    Interpreter Needed?: No  Information entered by :: C.Kaisha Wachob LPN   Activities of Daily Living    04/29/2022    3:44 PM 07/31/2021    1:59 PM  In your present state of health, do you have any difficulty performing the following activities:  Hearing? 0   Vision? 0   Difficulty concentrating or making decisions? 1   Comment Difficulty concentrating   Walking or climbing stairs? 1   Comment multiple knee surgeries, back pain   Dressing or bathing? 0   Doing errands, shopping? 0 1  Preparing Food and eating ? N   Using the Toilet? N   In the past six months, have you accidently leaked urine? Y   Comment occasional   Do you have problems with loss of bowel control? N   Managing your Medications? N   Managing your Finances? N   Housekeeping or managing your Housekeeping? N     Patient Care Team: Plotnikov, Georgina Quint, MD as PCP - General Wendall Stade, MD as PCP - Cardiology (Cardiology) Karie Soda, MD as Consulting Physician (General Surgery) Romero Belling, MD (Inactive) as Attending Physician (Internal Medicine) August Saucer Corrie Mckusick, MD (Orthopedic Surgery) Pollyann Savoy, MD as Consulting Physician (Rheumatology) Ardell Isaacs, MD (Pain Medicine) Pollyann Savoy, MD as Consulting Physician (Rheumatology) Venancio Poisson, MD as Consulting Physician (Dermatology) Oretha Milch, MD as Consulting Physician (Pulmonary  Disease) Wendall Stade, MD as Consulting Physician (Cardiology) Leonette Nutting, MD as Referring Physician (Dermatology) Genia Del Daisy Blossom, MD as Consulting Physician (Ophthalmology) Anson Fret, MD as Consulting Physician (Neurology) Dairl Ponder, MD as Consulting Physician (Ophthalmology) Soundra Pilon, LCSW as Social Worker (Licensed Clinical Social Worker)  Indicate any recent Medical Services you may have received from other than [facility] providers in the past year (date may be approximate).     Assessment:   This is a routine wellness examination for [place].  Hearing/Vision screen Hearing Screening - Comments:: No aids Vision Screening - Comments:: Glasses - [facility]  Dietary issues and exercise activities discussed: Current Exercise Habits: The patient does  not participate in regular exercise at present, Exercise limited by: orthopedic condition(s) (knees, back)   Goals Addressed             This Visit's Progress    Patient Stated       No new goals        Depression Screen    04/29/2022    3:35 PM 03/12/2022    1:36 PM 01/11/2022    1:31 PM 12/10/2021    1:18 PM 11/28/2021    2:06 PM 11/20/2021   10:16 AM 07/02/2021    1:26 PM  PHQ 2/9 Scores  PHQ - 2 Score 0 0 0 0 0 6 6  PHQ- 9 Score 0 0 0 0 Fall Risk    04/29/2022    3:43 PM 03/12/2022    1:36 PM 01/11/2022    1:31 PM 12/10/2021    1:18 PM 11/28/2021    2:05 PM  Fall Risk   Falls in the past year? 1 0 0 0 0  Number falls in past yr: 1 0 0 0 0  Comment Tripped over oxygen cord going to the bathroom      Injury with Fall? 1 0 0 0 0  Comment FX Femur      Risk for fall due to : History of fall(s);Impaired balance/gait;Orthopedic patient No Fall Risks No Fall Risks No Fall Risks History of fall(s)  Follow up Education provided;Falls prevention discussed;Falls evaluation completed Falls evaluation completed Falls evaluation completed Falls evaluation completed      FALL RISK PREVENTION PERTAINING TO THE HOME:  Any stairs in or around the home? No  If so, are there any without handrails? No  Home free of loose throw rugs in walkways, pet beds, electrical cords, etc? Yes  Adequate lighting in your home to reduce risk of falls? Yes   ASSISTIVE DEVICES UTILIZED TO PREVENT FALLS:  Life alert? No  Use of a cane, walker or w/c? Yes  Grab bars in the bathroom? Yes  Shower chair or bench in shower? Yes  Elevated toilet seat or a handicapped toilet? Yes   Cognitive Function:    11/06/2016   10:31 AM  MMSE - Mini Mental State Exam  Orientation to time 5  Orientation to Place 5  Registration 3  Attention/ Calculation 3  Recall 1  Language- name 2 objects 2  Language- repeat 1  Language- follow 3 step command 3  Language- read & follow direction 1  Write a sentence 1  Copy design 1  Total score 26        04/29/2022    3:47 PM 04/23/2021    2:20 PM  6CIT Screen  What Year? 0 points 0 points  What month? 0 points 0 points  What time? 0 points 0 points  Count back from 20 0 points 0 points  Months in reverse 0 points 0 points  Repeat phrase 0 points 0 points  Total Score 0 points 0 points    Immunizations Immunization History  Administered Date(s) Administered   Fluad Quad(high Dose 65+) 10/15/2021   H1N1 12/24/2007   Influenza Split 10/11/2010   Influenza Whole 10/14/2007, 10/05/2008, 10/26/2009, 11/13/2011   Influenza,inj,Quad PF,6+ Mos 09/21/2012, 09/24/2013, 10/06/2014, 09/05/2015, 11/06/2016, 09/24/2017, 10/07/2018, 11/15/2019, 10/13/2020   Influenza,inj,Quad PF,6-35 Mos 09/14/2020   PFIZER(Purple Top)SARS-COV-2 Vaccination 03/31/2019, 04/21/2019, 11/27/2019   PNEUMOCOCCAL CONJUGATE-20 07/19/2020   PPD Test 11/18/2014   Pneumococcal Conjugate-13 07/30/2010, 12/29/2012   Pneumococcal Polysaccharide-23 12/23/2013  Tdap 06/28/2014    TDAP status: Up to date  Flu Vaccine status: Up to date  Pneumococcal vaccine status:  Up to date  Covid-19 vaccine status: Information provided on how to obtain vaccines.   Qualifies for Shingles Vaccine? Yes   Zostavax completed No   Shingrix Completed?: No.    Education has been provided regarding the importance of this vaccine. Patient has been advised to call insurance company to determine out of pocket expense if they have not yet received this vaccine. Advised may also receive vaccine at local pharmacy or Health Dept. Verbalized acceptance and understanding.  Screening Tests Health Maintenance  Topic Date Due   PAP SMEAR-Modifier  Never done   FOOT EXAM  04/03/2018   OPHTHALMOLOGY EXAM  05/19/2021   COVID-19 Vaccine (4 - 2023-24 season) 09/14/2021   MAMMOGRAM  08/04/2022   INFLUENZA VACCINE  08/15/2022   HEMOGLOBIN A1C  10/10/2022   Diabetic kidney evaluation - eGFR measurement  12/11/2022   Diabetic kidney evaluation - Urine ACR  04/09/2023   Medicare Annual Wellness (AWV)  04/29/2023   DTaP/Tdap/Td (2 - Td or Tdap) 06/27/2024   COLONOSCOPY (Pts 45-93yrs Insurance coverage will need to be confirmed)  11/01/2029   Hepatitis C Screening  Completed   HIV Screening  Completed   HPV VACCINES  Aged Out   Zoster Vaccines- Shingrix  Discontinued    Health Maintenance  Health Maintenance Due  Topic Date Due   PAP SMEAR-Modifier  Never done   FOOT EXAM  04/03/2018   OPHTHALMOLOGY EXAM  05/19/2021   COVID-19 Vaccine (4 - 2023-24 season) 09/14/2021    Colorectal cancer screening: Type of screening: Colonoscopy. Completed 11/02/19. Repeat every 10 years  Mammogram status: Completed 04/26/22 per pt. Repeat every year    Lung Cancer Screening: (Low Dose CT Chest recommended if Age 63-80 years, 30 pack-year currently smoking OR have quit w/in 15years.) does not qualify.   Lung Cancer Screening Referral: no  Additional Screening:  Hepatitis C Screening: does qualify; Completed 04/09/22  Vision Screening: Recommended annual ophthalmology exams for early  detection of glaucoma and other disorders of the eye. Is the patient up to date with their annual eye exam?  Yes  Who is the provider or what is the name of the office in which the patient attends annual eye exams? [facility] If pt is not established with a provider, would they like to be referred to a provider to establish care? No .   Dental Screening: Recommended annual dental exams for proper oral hygiene  Community Resource Referral / Chronic Care Management: CRR required this visit?  No   CCM required this visit?  No      Plan:     I have personally reviewed and noted the following in the patient's chart:   Medical and social history Use of alcohol, tobacco or illicit drugs  Current medications and supplements including opioid prescriptions. Patient is currently taking opioid prescriptions. Information provided to patient regarding non-opioid alternatives. Patient advised to discuss non-opioid treatment plan with their provider. Functional ability and status Nutritional status Physical activity Advanced directives List of other physicians Hospitalizations, surgeries, and ER visits in previous 12 months Vitals Screenings to include cognitive, depression, and falls Referrals and appointments  In addition, I have reviewed and discussed with patient certain preventive protocols, quality metrics, and best practice recommendations. A written personalized care plan for preventive services as well as general preventive health recommendations were provided to patient.  Maryan Puls, LPN   1/61/0960   Nurse Notes: none    Medical screening examination/treatment/procedure(s) were performed by non-physician practitioner and as supervising physician I was immediately available for consultation/collaboration.  I agree with above. Jacinta Shoe, MD

## 2022-04-29 NOTE — Patient Instructions (Signed)
[name] , Thank you for taking time to come for your Medicare Wellness Visit. I appreciate your ongoing commitment to your health goals. Please review the following plan we discussed and let me know if I can assist you in the future.   These are the goals we discussed:  Goals      Care Coordinaton: Continue to improve post SNF rehab stay     Care Coordination Interventions: Advised patient to continue to attend counseling therapy sessions as scheduled Reviewed upcoming appointments and encouraged to attend as scheduled Care Coordinator recommended patient call to schedule an appointment with orthopedic provider for evaluation and plan or care for L knee pain Provided contact number to Ombudsman with [facility]    Phone: [phone]      Client will verbalize knowledge of chronic lung disease as evidenced by no ED visits or Inpatient stays related to chronic lung disease      CARE PLAN ENTRY (see longtitudinal plan of care for additional care plan information)  Current Barriers:  Knowledge deficits related to basic understanding of COPD disease process Knowledge deficits related to basic COPD self care/management Knowledge deficit related to importance of energy conservation Transportation barriers   Case Manager Clinical Goal(s): Over the next 90 days patient will report utilizing pursed lip breathing for shortness of breath Over the next 90 days, patient will be able to verbalize understanding of COPD action plan and when to seek appropriate levels of medical care Over the next 90 days, patient will engage in lite exercise as tolerated to build/regain stamina and strength and reduce shortness of breath through activity tolerance Over the next 90 days, patient will verbalize basic understanding of COPD disease process and self care activities Over the next 90 days, patient will not be hospitalized for COPD exacerbation   Interventions:  Provided  patient with basic written and verbal COPD education on self care/management/and exacerbation prevention  Provided patient with COPD action plan and reinforced importance of daily self assessment Provided written and verbal instructions on pursed lip breathing and utilized returned demonstration as teach back Advised patient to self assesses COPD action plan zone and make appointment with provider if in the yellow zone for 48 hours without improvement. Provided patient with education about the role of exercise in the management of COPD Advised patient to engage in light exercise as tolerated 3-5 days a week  Patient Self Care Activities:  Takes medications as prescribed including inhalers Practices and uses pursed lip breathing for shortness of breath recovery and prevention Self assesses COPD action plan zone and makes appointment with provider if in the yellow zone for 48 hours without improvement. Engages in light exercise 3-5 days a week Utilizes infection prevention strategies to reduce risk of respiratory infection  Behavior Modification  RN will follow up outreach within the month of October       My goal is to lose weight and get transportation so that I can go to the gym and do Silver Sneakers.     Patient Stated     I am going to get back into going to pulmonary rehab. Enjoy and love my son and grandchild.      Patient Stated     No new goals      Stay as independent as possible     Continue to work with physical therapy, go to pulmonary physical therapy, and do some chair exercises to get me stronger and more mobile.  This is a list of the screening recommended for you and due dates:  Health Maintenance  Topic Date Due   Pap Smear  Never done   Complete foot exam   04/03/2018   Eye exam for diabetics  05/19/2021   COVID-19 Vaccine (4 - 2023-24 season) 09/14/2021   Mammogram  08/04/2022   Flu Shot  08/15/2022   Hemoglobin A1C  10/10/2022   Yearly kidney  function blood test for diabetes  12/11/2022   Yearly kidney health urinalysis for diabetes  04/09/2023   Medicare Annual Wellness Visit  04/29/2023   DTaP/Tdap/Td vaccine (2 - Td or Tdap) 06/27/2024   Colon Cancer Screening  11/01/2029   Hepatitis C Screening: USPSTF Recommendation to screen - Ages 102-79 yo.  Completed   HIV Screening  Completed   HPV Vaccine  Aged Out   Zoster (Shingles) Vaccine  Discontinued   Opioid Pain Medicine Management Opioids are powerful medicines that are used to treat moderate to severe pain. When used for short periods of time, they can help you to: Sleep better. Do better in physical or occupational therapy. Feel better in the first few days after an injury. Recover from surgery. Opioids should be taken with the supervision of a trained health care provider. They should be taken for the shortest period of time possible. This is because opioids can be addictive, and the longer you take opioids, the greater your risk of addiction. This addiction can also be called opioid use disorder. What are the risks? Using opioid pain medicines for longer than 3 days increases your risk of side effects. Side effects include: Constipation. Nausea and vomiting. Breathing difficulties (respiratory depression). Drowsiness. Confusion. Opioid use disorder. Itching. Taking opioid pain medicine for a long period of time can affect your ability to do daily tasks. It also puts you at risk for: Motor vehicle crashes. Depression. Suicide. Heart attack. Overdose, which can be life-threatening. What is a pain treatment plan? A pain treatment plan is an agreement between you and your health care provider. Pain is unique to each person, and treatments vary depending on your condition. To manage your pain, you and your health care provider need to work together. To help you do this: Discuss the goals of your treatment, including how much pain you might expect to have and how you  will manage the pain. Review the risks and benefits of taking opioid medicines. Remember that a good treatment plan uses more than one approach and minimizes the chance of side effects. Be honest about the amount of medicines you take and about any drug or alcohol use. Get pain medicine prescriptions from only one health care provider. Pain can be managed with many types of alternative treatments. Ask your health care provider to refer you to one or more specialists who can help you manage pain through: Physical or occupational therapy. Counseling (cognitive behavioral therapy). Good nutrition. Biofeedback. Massage. Meditation. Non-opioid medicine. Following a gentle exercise program. How to use opioid pain medicine Taking medicine Take your pain medicine exactly as told by your health care provider. Take it only when you need it. If your pain gets less severe, you may take less than your prescribed dose if your health care provider approves. If you are not having pain, do nottake pain medicine unless your health care provider tells you to take it. If your pain is severe, do nottry to treat it yourself by taking more pills than instructed on your prescription. Contact your health care provider for  help. Write down the times when you take your pain medicine. It is easy to become confused while on pain medicine. Writing the time can help you avoid overdose. Take other over-the-counter or prescription medicines only as told by your health care provider. Keeping yourself and others safe  While you are taking opioid pain medicine: Do not drive, use machinery, or power tools. Do not sign legal documents. Do not drink alcohol. Do not take sleeping pills. Do not supervise children by yourself. Do not do activities that require climbing or being in high places. Do not go to a lake, river, ocean, spa, or swimming pool. Do not share your pain medicine with anyone. Keep pain medicine in a locked  cabinet or in a secure area where pets and children cannot reach it. Stopping your use of opioids If you have been taking opioid medicine for more than a few weeks, you may need to slowly decrease (taper) how much you take until you stop completely. Tapering your use of opioids can decrease your risk of symptoms of withdrawal, such as: Pain and cramping in the abdomen. Nausea. Sweating. Sleepiness. Restlessness. Uncontrollable shaking (tremors). Cravings for the medicine. Do not attempt to taper your use of opioids on your own. Talk with your health care provider about how to do this. Your health care provider may prescribe a step-down schedule based on how much medicine you are taking and how long you have been taking it. Getting rid of leftover pills Do not save any leftover pills. Get rid of leftover pills safely by: Taking the medicine to a prescription take-back program. This is usually offered by the county or law enforcement. Bringing them to a pharmacy that has a drug disposal container. Flushing them down the toilet. Check the label or package insert of your medicine to see whether this is safe to do. Throwing them out in the trash. Check the label or package insert of your medicine to see whether this is safe to do. If it is safe to throw it out, remove the medicine from the original container, put it into a sealable bag or container, and mix it with used coffee grounds, food scraps, dirt, or cat litter before putting it in the trash. Follow these instructions at home: Activity Do exercises as told by your health care provider. Avoid activities that make your pain worse. Return to your normal activities as told by your health care provider. Ask your health care provider what activities are safe for you. General instructions You may need to take these actions to prevent or treat constipation: Drink enough fluid to keep your urine pale yellow. Take over-the-counter or prescription  medicines. Eat foods that are high in fiber, such as beans, whole grains, and fresh fruits and vegetables. Limit foods that are high in fat and processed sugars, such as fried or sweet foods. Keep all follow-up visits. This is important. Where to find support If you have been taking opioids for a long time, you may benefit from receiving support for quitting from a local support group or counselor. Ask your health care provider for a referral to these resources in your area. Where to find more information Centers for Disease Control and Prevention (CDC): FootballExhibition.com.br [place] Food and Drug Administration (FDA): PumpkinSearch.com.ee Get help right away if: You may have taken too much of an opioid (overdosed). Common symptoms of an overdose: Your breathing is slower or more shallow than normal. You have a very slow heartbeat (pulse). You have slurred speech.  You have nausea and vomiting. Your pupils become very small. You have other potential symptoms: You are very confused. You faint or feel like you will faint. You have cold, clammy skin. You have blue lips or fingernails. You have thoughts of harming yourself or harming others. These symptoms may represent a serious problem that is an emergency. Do not wait to see if the symptoms will go away. Get medical help right away. Call your local emergency services (911 in the [place] Do not drive yourself to the hospital.  If you ever feel like you may hurt yourself or others, or have thoughts about taking your own life, get help right away. Go to your nearest emergency department or: Call your local emergency services (911 in the [place] Call the [facility] (613 511 4330 in the [place] Call a suicide crisis helpline, such as the National Suicide Prevention Lifeline at (760)127-8653 or 988 in the [place] This is open 24 hours a day in the [place] Text the Crisis Text Line at 628-048-2914 (in the [place] Summary Opioid medicines can help you manage  moderate to severe pain for a short period of time. A pain treatment plan is an agreement between you and your health care provider. Discuss the goals of your treatment, including how much pain you might expect to have and how you will manage the pain. If you think that you or someone else may have taken too much of an opioid, get medical help right away. This information is not intended to replace advice given to you by your health care provider. Make sure you discuss any questions you have with your health care provider. Document Revised: 07/26/2020 Document Reviewed: 04/12/2020 Elsevier Patient Education  2023 Elsevier Inc.   Advanced directives: none  Conditions/risks identified: Aim for 30 minutes of exercise or brisk walking, 6-8 glasses of water, and 5 servings of fruits and vegetables each day.   Next appointment: Follow up in one year for your annual wellness visit. 04/30/23 @ 1:30 telephone visit  Preventive Care 40-64 Years, Female Preventive care refers to lifestyle choices and visits with your health care provider that can promote health and wellness. What does preventive care include? A yearly physical exam. This is also called an annual well check. Dental exams once or twice a year. Routine eye exams. Ask your health care provider how often you should have your eyes checked. Personal lifestyle choices, including: Daily care of your teeth and gums. Regular physical activity. Eating a healthy diet. Avoiding tobacco and drug use. Limiting alcohol use. Practicing safe sex. Taking low-dose aspirin daily starting at age 25. Taking vitamin and mineral supplements as recommended by your health care provider. What happens during an annual well check? The services and screenings done by your health care provider during your annual well check will depend on your age, overall health, lifestyle risk factors, and family history of disease. Counseling  Your health care provider may  ask you questions about your: Alcohol use. Tobacco use. Drug use. Emotional well-being. Home and relationship well-being. Sexual activity. Eating habits. Work and work Astronomer. Method of birth control. Menstrual cycle. Pregnancy history. Screening  You may have the following tests or measurements: Height, weight, and BMI. Blood pressure. Lipid and cholesterol levels. These may be checked every 5 years, or more frequently if you are over 60 years old. Skin check. Lung cancer screening. You may have this screening every year starting at age 27 if you have a 30-pack-year history of smoking and currently  smoke or have quit within the past 15 years. Fecal occult blood test (FOBT) of the stool. You may have this test every year starting at age 38. Flexible sigmoidoscopy or colonoscopy. You may have a sigmoidoscopy every 5 years or a colonoscopy every 10 years starting at age 28. Hepatitis C blood test. Hepatitis B blood test. Sexually transmitted disease (STD) testing. Diabetes screening. This is done by checking your blood sugar (glucose) after you have not eaten for a while (fasting). You may have this done every 1-3 years. Mammogram. This may be done every 1-2 years. Talk to your health care provider about when you should start having regular mammograms. This may depend on whether you have a family history of breast cancer. BRCA-related cancer screening. This may be done if you have a family history of breast, ovarian, tubal, or peritoneal cancers. Pelvic exam and Pap test. This may be done every 3 years starting at age 106. Starting at age 57, this may be done every 5 years if you have a Pap test in combination with an HPV test. Bone density scan. This is done to screen for osteoporosis. You may have this scan if you are at high risk for osteoporosis. Discuss your test results, treatment options, and if necessary, the need for more tests with your health care provider. Vaccines  Your  health care provider may recommend certain vaccines, such as: Influenza vaccine. This is recommended every year. Tetanus, diphtheria, and acellular pertussis (Tdap, Td) vaccine. You may need a Td booster every 10 years. Zoster vaccine. You may need this after age 58. Pneumococcal 13-valent conjugate (PCV13) vaccine. You may need this if you have certain conditions and were not previously vaccinated. Pneumococcal polysaccharide (PPSV23) vaccine. You may need one or two doses if you smoke cigarettes or if you have certain conditions. Talk to your health care provider about which screenings and vaccines you need and how often you need them. This information is not intended to replace advice given to you by your health care provider. Make sure you discuss any questions you have with your health care provider. Document Released: 01/27/2015 Document Revised: 09/20/2015 Document Reviewed: 11/01/2014 Elsevier Interactive Patient Education  2017 [employer].    Fall Prevention in the Home Falls can cause injuries. They can happen to people of all ages. There are many things you can do to make your home safe and to help prevent falls. What can I do on the outside of my home? Regularly fix the edges of walkways and driveways and fix any cracks. Remove anything that might make you trip as you walk through a door, such as a raised step or threshold. Trim any bushes or trees on the path to your home. Use bright outdoor lighting. Clear any walking paths of anything that might make someone trip, such as rocks or tools. Regularly check to see if handrails are loose or broken. Make sure that both sides of any steps have handrails. Any raised decks and porches should have guardrails on the edges. Have any leaves, snow, or ice cleared regularly. Use sand or salt on walking paths during winter. Clean up any spills in your garage right away. This includes oil or grease spills. What can I do in the  bathroom? Use night lights. Install grab bars by the toilet and in the tub and shower. Do not use towel bars as grab bars. Use non-skid mats or decals in the tub or shower. If you need to sit down in the  shower, use a plastic, non-slip stool. Keep the floor dry. Clean up any water that spills on the floor as soon as it happens. Remove soap buildup in the tub or shower regularly. Attach bath mats securely with double-sided non-slip rug tape. Do not have throw rugs and other things on the floor that can make you trip. What can I do in the bedroom? Use night lights. Make sure that you have a light by your bed that is easy to reach. Do not use any sheets or blankets that are too big for your bed. They should not hang down onto the floor. Have a firm chair that has side arms. You can use this for support while you get dressed. Do not have throw rugs and other things on the floor that can make you trip. What can I do in the kitchen? Clean up any spills right away. Avoid walking on wet floors. Keep items that you use a lot in easy-to-reach places. If you need to reach something above you, use a strong step stool that has a grab bar. Keep electrical cords out of the way. Do not use floor polish or wax that makes floors slippery. If you must use wax, use non-skid floor wax. Do not have throw rugs and other things on the floor that can make you trip. What can I do with my stairs? Do not leave any items on the stairs. Make sure that there are handrails on both sides of the stairs and use them. Fix handrails that are broken or loose. Make sure that handrails are as long as the stairways. Check any carpeting to make sure that it is firmly attached to the stairs. Fix any carpet that is loose or worn. Avoid having throw rugs at the top or bottom of the stairs. If you do have throw rugs, attach them to the floor with carpet tape. Make sure that you have a light switch at the top of the stairs and the  bottom of the stairs. If you do not have them, ask someone to add them for you. What else can I do to help prevent falls? Wear shoes that: Do not have high heels. Have rubber bottoms. Are comfortable and fit you well. Are closed at the toe. Do not wear sandals. If you use a stepladder: Make sure that it is fully opened. Do not climb a closed stepladder. Make sure that both sides of the stepladder are locked into place. Ask someone to hold it for you, if possible. Clearly mark and make sure that you can see: Any grab bars or handrails. First and last steps. Where the edge of each step is. Use tools that help you move around (mobility aids) if they are needed. These include: Canes. Walkers. Scooters. Crutches. Turn on the lights when you go into a dark area. Replace any light bulbs as soon as they burn out. Set up your furniture so you have a clear path. Avoid moving your furniture around. If any of your floors are uneven, fix them. If there are any pets around you, be aware of where they are. Review your medicines with your doctor. Some medicines can make you feel dizzy. This can increase your chance of falling. Ask your doctor what other things that you can do to help prevent falls. This information is not intended to replace advice given to you by your health care provider. Make sure you discuss any questions you have with your health care provider. Document Released: 10/27/2008 Document  Revised: 06/08/2015 Document Reviewed: 02/04/2014 Elsevier Interactive Patient Education  2017 [employer].

## 2022-04-30 ENCOUNTER — Encounter: Payer: Self-pay | Admitting: Adult Health

## 2022-04-30 ENCOUNTER — Ambulatory Visit (INDEPENDENT_AMBULATORY_CARE_PROVIDER_SITE_OTHER): Payer: 59 | Admitting: Adult Health

## 2022-04-30 VITALS — BP 108/80 | HR 86

## 2022-04-30 DIAGNOSIS — J209 Acute bronchitis, unspecified: Secondary | ICD-10-CM

## 2022-04-30 DIAGNOSIS — I2782 Chronic pulmonary embolism: Secondary | ICD-10-CM | POA: Diagnosis not present

## 2022-04-30 DIAGNOSIS — J9611 Chronic respiratory failure with hypoxia: Secondary | ICD-10-CM

## 2022-04-30 DIAGNOSIS — R051 Acute cough: Secondary | ICD-10-CM

## 2022-04-30 DIAGNOSIS — D869 Sarcoidosis, unspecified: Secondary | ICD-10-CM | POA: Diagnosis not present

## 2022-04-30 DIAGNOSIS — J441 Chronic obstructive pulmonary disease with (acute) exacerbation: Secondary | ICD-10-CM

## 2022-04-30 MED ORDER — HUMIRA PEN CITRATE FREE 40 MG/0.4 ML
SUBCUTANEOUS | 11 refills | 28 days
Start: 2022-04-30 — End: ?

## 2022-04-30 MED ORDER — PREDNISONE 10 MG PO TABS
ORAL_TABLET | ORAL | 0 refills | Status: DC
Start: 2022-04-30 — End: 2022-06-04

## 2022-04-30 MED ORDER — CEFDINIR 300 MG PO CAPS
300.0000 mg | ORAL_CAPSULE | Freq: Two times a day (BID) | ORAL | 0 refills | Status: DC
Start: 2022-04-30 — End: 2022-06-04

## 2022-04-30 NOTE — Patient Instructions (Addendum)
Sputum culture  Omnicef 300mg  Twice daily  for 1 week  Prednisone 20mg  daily for 1 week and then 10mg  daily for 1 week and then stop.  Salt water gargles As needed   Continue on Breztri 2 puffs Twice daily   Albuterol inhaler or Neb As needed  or  Continue on Xarelto 20mg  daily  Mucinex Twice daily  As needed  Cough/congestion  Delsym 2 tsp Twice daily for cough.  Continue on Oxygen 2l/m at rest and increase 3l/m with walking .  Activity as tolerated.  Work on healthy weight loss.  Follow up Dr. Vassie Loll in 6-8 week with PFTs and As needed   Please contact office for sooner follow up if symptoms do not improve or worsen or seek emergency care

## 2022-04-30 NOTE — Progress Notes (Unsigned)
  ID: [name], female    DOB: [birth year], 62 y.o.   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  History of sleep apnea CPAP intolerant Medical history significant for chronic back pain secondary to spinal stenosis  TEST/EVENTS :  2018 started on Humira for reoccurring cutaneous lesions not responding to methotrexate. Previously on prednisone but tapered off.   Admitted 07/21/2016 for RLE DVT-had stopped Xarelto 04/2016 for prior pulmonary embolism   CT scan = chronic appearing subsegmental PE , on Xarelto lifelong    PFT 05/2014 >> FVC 65%, ratio 69, FEV1 58%, DLCO 62%   VQ Scan 08/17/15 >> LLL & RUL acute PE    ECHO 08/18/15 >> mild LVH , EF 55-60%, Gr 1 DD .    LE Venous Doppler 08/18/15 >> neg DVT , no bakers cyst, ?prominent inguinal lymph node measuring 3.1cm    MRI brain neg [facility] = MRI spine = mild to moderate disc protrusion/left-sided spinal stenosis-chronic pain   NPSG 12/2004 mild, RDI 13/h   SARS-CoV-2 July 23, 2018- negative   CT chest July 2020 showed stable sarcoid to mildly decreased.(Nodular opacities (stable mediastinal and bilateral hilar adenopathy  decreased.(Nodular opacities (stable mediastinal and bilateral hilar adenopathy.     PFTs done in July 2020 showed stable lung function with FEV1 at 70%, ratio 81, FVC 67% and DLCO was 61%.  This was similar to 2016  04/30/2022 Follow up: Sarcoidosis, bronchitis, oxygen dependent respiratory failure Patient returns for a 2-week follow-up.  Patient was seen last visit for an acute office visit with slow to resolve cough, congestion, fever and sore throat.  She was treated with [facility] for 7 days and a  prednisone taper.  Patient says she is feeling better.  She has decreased cough and congestion.  Still is getting up some yellow mucus and has occasional wheezing.  The sore throat has resolved.  No further fevers.  Appetite is good with no nausea vomiting or diarrhea Chest x-ray on April 15, 2022 showed bronchitic changes, stable interstitial markings in the right lower lobe and perihilar regions suggestive of scarring.  Patient is followed for cutaneous sarcoidosis with dermatology on Humira.  She is also being followed by [facility] ophthalmology for visual issues.  Remains on Xarelto for recurrent PE.  Endorses compliance.  Denies any known bleeding.  Patient is alone.  He is able to drive.  Remains independent.  She uses a rolling walker.  Remains on oxygen 2 L at rest and 3 L with activity.  Allergies  Allergen Reactions   Belsomra [Suvorexant] Other (See Comments)    Larey Seat out of bed while asleep: "I'm waking up as I'm falling on the floor;" "Night terrors"   Enalapril Maleate Cough   Latex Hives   Nickel Other (See Comments)    Blisters  Pt has a titanium right and left knee - nickel causes skin irritations that form into blisters and sores   Other Other (See Comments)    Patient has Sarcoidosis and can't tolerate any metals   Augmentin [Amoxicillin-Pot Clavulanate]     Purpura type rash   Morphine And Related Itching and Nausea Only   Doxycycline Diarrhea and Nausea And Vomiting   Hydrochlorothiazide Other (See Comments)  Low potassium levels    Hydroxychloroquine Sulfate Other (See Comments)    Vision changes    Lyrica [Pregabalin] Other (See Comments)    "Made me feel high"   Tape Other (See Comments)    Medical tape causes bruising    Immunization History  Administered Date(s) Administered   Fluad Quad(high Dose 65+) 10/15/2021   H1N1 12/24/2007   Influenza Split 10/11/2010   Influenza Whole 10/14/2007, 10/05/2008, 10/26/2009, 11/13/2011   Influenza,inj,Quad  PF,6+ Mos 09/21/2012, 09/24/2013, 10/06/2014, 09/05/2015, 11/06/2016, 09/24/2017, 10/07/2018, 11/15/2019, 10/13/2020   Influenza,inj,Quad PF,6-35 Mos 09/14/2020   PFIZER(Purple Top)SARS-COV-2 Vaccination 03/31/2019, 04/21/2019, 11/27/2019   PNEUMOCOCCAL CONJUGATE-20 07/19/2020   PPD Test 11/18/2014   Pneumococcal Conjugate-13 07/30/2010, 12/29/2012   Pneumococcal Polysaccharide-23 12/23/2013   Tdap 06/28/2014    Past Medical History:  Diagnosis Date   ALLERGIC RHINITIS 10/26/2009   no current problems   Anxiety    B12 DEFICIENCY 08/25/2007   takes B12 supplement   Complication of anesthesia    pt has had difficulty following anesthesia with her knee in 2016-unable to care for herself afterward   Depression    takes effexor xr daily   Diabetes mellitus without complication    was on insulin but has been off since Nov 2015 and now only takes Metformin daily   DYSPNEA 04/28/2009   uses oxygen 24/7, on 2L via [place]   Esophageal reflux    takes Nexium daily   Fibromyalgia    Headache    last migraine 2-55yrs ago;takes Topamax daily   History of shingles    Hypertension    takes Coreg daily   Insomnia    takes Nortriptyline nightly    Long-term memory loss    OSA (obstructive sleep apnea)    doesn't use CPAP;sleep study in epic from 2006   Osteoarthritis    Panic attacks    PONV (postoperative nausea and vomiting)    on time in 2016 knee surgery   Rheumatoid arthritis    Sarcoidosis    Dr. Wilford Grist   Stroke    Vitamin D deficiency    is supposed to take Vit D but can't afford it    Tobacco History: Social History   Tobacco Use  Smoking Status Former   Packs/day: 0.50   Years: 10.00   Additional pack years: 0.00   Total pack years: 5.00   Types: Cigarettes   Start date: 1988   Quit date: 2004   Years since quitting: 20.3  Smokeless Tobacco Never  Tobacco Comments   quit smoking in 2004   Counseling given: Not Answered Tobacco comments: quit smoking in  2004   Outpatient Medications Prior to Visit  Medication Sig Dispense Refill   acetaminophen (TYLENOL) 325 MG tablet Take 1-2 tablets (325-650 mg total) by mouth every 6 (six) hours as needed for mild pain (pain score 1-3 or temp > 100.5). 30 tablet 1   albuterol (PROVENTIL) (2.5 MG/3ML) 0.083% nebulizer solution Take 3 mLs (2.5 mg total) by nebulization every 6 (six) hours as needed for wheezing or shortness of breath. 75 mL 5   albuterol (VENTOLIN HFA) 108 (90 Base) MCG/ACT inhaler INHALE TWO puffs into THE lungs EVERY SIX HOURS AS NEEDED wheezind AND For SHORTNESS OF BREATH 6.7 g 3   B-D ULTRA-FINE 33 LANCETS MISC Use as instructed to test sugars once  daily 100 each 12   carvedilol (COREG) 12.5 MG tablet Take 1 tablet (12.5 mg total) by mouth 2 (two) times daily with a meal.  180 tablet 1   Cholecalciferol (VITAMIN D-3) 125 MCG (5000 UT) TABS Take 5,000 Units by mouth daily.     Cyanocobalamin (VITAMIN B-12) 1000 MCG SUBL Place 1 tablet (1,000 mcg total) under the tongue daily. (Patient taking differently: Place 1,000 mcg under the tongue daily.) 100 tablet 3   dexlansoprazole (DEXILANT) 60 MG capsule TAKE ONE CAPSULE BY MOUTH EVERY DAY 90 capsule 1   Docusate Sodium (DSS) 100 MG CAPS Take by mouth.     gabapentin (NEURONTIN) 300 MG capsule TAKE ONE CAPSULE BY MOUTH EVERY MORNING **May take a second capsule during THE DAY AS NEEDED FOR PAIN 180 capsule 0   glucose blood (ACCU-CHEK GUIDE) test strip Use to check blood sugar once a day. 100 each 12   HUMIRA PEN 40 MG/0.4ML PNKT Inject 40 mg as directed every Wednesday.      HYDROmorphone (DILAUDID) 8 MG tablet Take 1 tablet (8 mg total) by mouth 2 (two) times daily as needed for severe pain. 60 tablet 0   hydrOXYzine (ATARAX) 25 MG tablet Take 1 tablet (25 mg total) by mouth every 8 (eight) hours as needed. 60 tablet 3   ketoconazole (NIZORAL) 2 % cream Apply 1 Application topically 2 (two) times daily. Apply under breasts 45 g 1   lidocaine  (XYLOCAINE) 2 % solution Use as directed 15 mLs in the mouth or throat every 6 (six) hours as needed for mouth pain. 100 mL 0   metFORMIN (GLUCOPHAGE-XR) 500 MG 24 hr tablet Take 2 tablets (1,000 mg total) by mouth at bedtime. 180 tablet 1   methocarbamol (ROBAXIN) 500 MG tablet Take 1 tablet (500 mg total) by mouth every 6 (six) hours as needed for muscle spasms. 30 tablet 0   Multiple Vitamins-Minerals (MULTIVITAMIN WOMEN 50+) TABS Take 1 tablet by mouth daily.     mupirocin ointment (BACTROBAN) 2 % On leg wound w/dressing change qd or bid 30 g 0   naloxone (NARCAN) nasal spray 4 mg/0.1 mL Place 1 spray into the nose once as needed (overdose).     naltrexone (DEPADE) 50 MG tablet Take 50 mg by mouth daily.     nystatin ointment (MYCOSTATIN) Apply 1 Application topically 2 (two) times daily. 100 g 0   ofloxacin (FLOXIN) 0.3 % OTIC solution      OXYGEN Inhale 2 L into the lungs continuous.     polyvinyl alcohol (LIQUIFILM TEARS) 1.4 % ophthalmic solution Place 1 drop into both eyes as needed for dry eyes. 15 mL 0   potassium chloride (KLOR-CON M) 10 MEQ tablet TAKE ONE TABLET BY MOUTH EVERY DAY 90 tablet 1   QUEtiapine (SEROQUEL) 50 MG tablet Take 1 tab in am and 2 tab at bedtime 90 tablet 5   rivaroxaban (XARELTO) 20 MG TABS tablet TAKE ONE TABLET BY MOUTH EVERY DAY 90 tablet 3   topiramate (TOPAMAX) 50 MG tablet TAKE ONE TABLET BY MOUTH TWICE DAILY 180 tablet 1   venlafaxine XR (EFFEXOR-XR) 75 MG 24 hr capsule Take 1 capsule (75 mg total) by mouth daily with breakfast. 30 capsule 11   budesonide-formoterol (SYMBICORT) 160-4.5 MCG/ACT inhaler INHALE TWO puffs into THE lungs TWICE DAILY (Patient not taking: Reported on 04/30/2022) 10.2 g 5   cefdinir (OMNICEF) 300 MG capsule Take 1 capsule (300 mg total) by mouth 2 (two) times daily. (Patient not taking: Reported on 04/29/2022) 14 capsule 0   predniSONE (DELTASONE) 10 MG tablet 4 tabs for 2 days, then 3 tabs for 2 days, 2  tabs for 2 days, then 1 tab  for 2 days, then stop (Patient not taking: Reported on 04/30/2022) 20 tablet 0   No facility-administered medications prior to visit.     Review of Systems:   Constitutional:   No  weight loss, night sweats,  Fevers, chills,  +fatigue, or  lassitude.  HEENT:   No headaches,  Difficulty swallowing,  Tooth/dental problems, or  Sore throat,                No sneezing, itching, ear ache,  +nasal congestion, post nasal drip,   CV:  No chest pain,  Orthopnea, PND, swelling in lower extremities, anasarca, dizziness, palpitations, syncope.   GI  No heartburn, indigestion, abdominal pain, nausea, vomiting, diarrhea, change in bowel habits, loss of appetite, bloody stools.   Resp: No shortness of breath with exertion or at rest.  No excess mucus, no productive cough,  No non-productive cough,  No coughing up of blood.  No change in color of mucus.  No wheezing.  No chest wall deformity  Skin: no rash or lesions.  GU: no dysuria, change in color of urine, no urgency or frequency.  No flank pain, no hematuria   MS:  No joint pain or swelling.  No decreased range of motion.  No back pain.    Physical Exam  Pulse 86   LMP 05/19/2003   SpO2 91%   GEN: A/Ox3; pleasant , NAD, chronically ill-appearing, on oxygen, walker   HEENT:  [place]/AT,   no lesions, no postnasal drip or exudate noted.   NECK:  Supple w/ fair ROM; no JVD; normal carotid impulses w/o bruits; no thyromegaly or nodules palpated; no lymphadenopathy.    RESP few scattered rhonchi, speaks in full sentences.  no accessory muscle use, no dullness to percussion  CARD:  RRR, no m/r/g, tr  peripheral edema, pulses intact, no cyanosis or clubbing.  GI:   Soft & nt; nml bowel sounds; no organomegaly or masses detected.   Musco: Warm bil, no deformities or joint swelling noted.   Neuro: alert, no focal deficits noted.    Skin: Warm, no lesions or rashes    Lab Results:    BNP   Imaging: DG Chest 2 View  Result Date:  04/15/2022 CLINICAL DATA:  Cough EXAM: CHEST - 2 VIEW COMPARISON:  02/01/2021 FINDINGS: Cardiac size is within normal limits. There is peribronchial thickening. There is increase in interstitial markings in the parahilar regions and right lower lung field. There is no new focal consolidation. Small linear density in the lateral aspect of right upper lung field appears stable suggesting scarring. There is no pleural effusion or pneumothorax. IMPRESSION: Peribronchial thickening suggests bronchitis. There is prominence of interstitial markings in the parahilar regions and right lower lung fields with no significant interval change suggesting scarring. No new focal pulmonary consolidation is seen. There is no pleural effusion or pneumothorax. Electronically Signed   By: Ernie Avena M.D.   On: 04/15/2022 14:12    albuterol (PROVENTIL) (2.5 MG/3ML) 0.083% nebulizer solution 2.5 mg     Date Action Dose Route User   04/15/2022 1449 Given 2.5 mg Nebulization Ellin Saba, Ledell Peoples, RN      albuterol (PROVENTIL) (2.5 MG/3ML) 0.083% nebulizer solution 2.5 mg     Date Action Dose Route User   04/18/2022 1509 Given 2.5 mg Nebulization Lanna Poche, CMA      ketorolac (TORADOL) injection 60 mg     Date Action Dose Route User  03/12/2022 1436 Given 60 mg Intramuscular (Right Deltoid) Racheal Patches, CMA      methylPREDNISolone acetate (DEPO-MEDROL) injection 80 mg     Date Action Dose Route User   03/12/2022 1437 Given 80 mg Intramuscular (Right Deltoid) Racheal Patches, CMA      methylPREDNISolone acetate (DEPO-MEDROL) injection 120 mg     Date Action Dose Route User   04/18/2022 1445 Given 120 mg Intramuscular (Right Ventrogluteal) Franco Nones, CMA          Latest Ref Rng & Units 07/27/2018   10:30 AM 12/30/2016    9:01 AM 05/18/2014   10:41 AM  PFT Results  FVC-Pre L 2.51  P 2.07  P 2.29   FVC-Predicted Pre % 67  P 54  P 61   FVC-Post L 2.51  P 2.18  P 2.43   FVC-Predicted  Post % 67  P 57  P 65   Pre FEV1/FVC % % 79  P 70  P 69   Post FEV1/FCV % % 81  P 68  P 70   FEV1-Pre L 1.99  P 1.46  P 1.58   FEV1-Predicted Pre % 68  P 49  P 54   FEV1-Post L 2.03  P 1.49  P 1.70   DLCO uncorrected ml/min/mmHg 13.89  P 11.74  P 16.78   DLCO UNC% % 61  P 41  P 62   DLCO corrected ml/min/mmHg  11.34  P   DLCO COR %Predicted %  40  P   DLVA Predicted % 84  P 66  P 91   TLC L 4.27  P 4.07  P   TLC % Predicted % 77  P 74  P   RV % Predicted % 84  P 92  P     P Preliminary result    No results found for: "NITRICOXIDE"      Assessment & Plan:   No problem-specific Assessment & Plan notes found for this encounter.     [name], NP 04/30/2022

## 2022-05-01 MED ORDER — HUMIRA PEN CITRATE FREE 40 MG/0.4 ML
SUBCUTANEOUS | 11 refills | 28.00000 days | Status: CP
Start: 2022-05-01 — End: ?
  Filled 2022-05-01: qty 4, 28d supply, fill #0

## 2022-05-01 NOTE — Unmapped (Signed)
Carolyn Newton 's HUMIRA(CF) PEN 40 mg/0.4 mL injection (adalimumab) shipment will be delayed as a result of no refills remain on the prescription.      I have reached out to the patient  at [phone] and communicated the delay. We will call the patient back to reschedule the delivery upon resolution. We have not confirmed the new delivery date.

## 2022-05-01 NOTE — Unmapped (Signed)
Carolyn Newton 's HUMIRA(CF) PEN 40 mg/0.4 mL injection (adalimumab) shipment will be sent out  as a result of a new prescription for the medication has been received.      I have reached out to the patient  at [phone] and communicated the delivery change. We will reschedule the medication for the delivery date that the patient agreed upon.  We have confirmed the delivery date as 05/02/22

## 2022-05-01 NOTE — Telephone Encounter (Signed)
Called pt gave her MD response.She states she will try the patches. Can send to [facility].Marland KitchenRaechel Chute

## 2022-05-01 NOTE — Telephone Encounter (Signed)
We can try a fentanyl patch for pain if [name] is willing.  Thanks

## 2022-05-02 NOTE — Assessment & Plan Note (Signed)
History of recurrent PE, continue on Xarelto

## 2022-05-02 NOTE — Assessment & Plan Note (Signed)
Recurrent exacerbation.  Check high-resolution CT chest and PFTs

## 2022-05-02 NOTE — Assessment & Plan Note (Addendum)
Check PFTs on return. Treat with empiric antibiotics and steroids  Plan  Patient Instructions  Sputum culture  Omnicef 300mg  Twice daily  for 1 week  Prednisone  daily for 1 week and then  daily for 1 week and then stop.  Salt water gargles As needed   Continue on Breztri 2 puffs Twice daily   Albuterol inhaler or Neb As needed  or  Continue on Xarelto  daily  Mucinex Twice daily  As needed  Cough/congestion  Delsym 2 tsp Twice daily for cough.  Continue on Oxygen 2l/m at rest and increase 3l/m with walking .  Activity as tolerated.  Work on healthy weight loss.  Follow up Dr. Vassie Lo not improve or worsen or seek emergency care

## 2022-05-02 NOTE — Assessment & Plan Note (Signed)
Slow to resolve flare -does have lingering symptoms  Check sputum culture . Chest xray w/out acute process  Check HRCT chest . Check PFT on return    Plan  Patient Instructions  Sputum culture  Omnicef 300mg  Twice daily  for 1 week  Prednisone 20mg  daily for 1 week and then 10mg  daily for 1 week and then stop.  Salt water gargles As needed   Continue on Breztri 2 puffs Twice daily   Albuterol inhaler or Neb As needed  or  Continue on Xarelto 20mg  daily  Mucinex Twice daily  As needed  Cough/congestion  Delsym 2 tsp Twice daily for cough.  Continue on Oxygen 2l/m at rest and increase 3l/m with walking .  Activity as tolerated.  Work on healthy weight loss.  Follow up Dr. Vassie Loll in 6-8 week with PFTs and As needed   Please contact office for sooner follow up if symptoms do not improve or worsen or seek emergency care

## 2022-05-02 NOTE — Assessment & Plan Note (Addendum)
Continue on oxygen to maintain O2 saturations greater than 88 to 90%. 

## 2022-05-03 MED ORDER — FENTANYL 25 MCG/HR TD PT72: 1.0000 | MEDICATED_PATCH | TRANSDERMAL | 0 refills | Status: DC

## 2022-05-03 NOTE — Telephone Encounter (Signed)
Okay. Thank you.

## 2022-05-06 ENCOUNTER — Other Ambulatory Visit: Payer: 59

## 2022-05-06 DIAGNOSIS — R051 Acute cough: Secondary | ICD-10-CM | POA: Diagnosis not present

## 2022-05-08 DIAGNOSIS — J449 Chronic obstructive pulmonary disease, unspecified: Secondary | ICD-10-CM | POA: Diagnosis not present

## 2022-05-08 LAB — RESPIRATORY CULTURE OR RESPIRATORY AND SPUTUM CULTURE: MICRO NUMBER:: 14855988

## 2022-05-13 ENCOUNTER — Ambulatory Visit (INDEPENDENT_AMBULATORY_CARE_PROVIDER_SITE_OTHER): Payer: 59 | Admitting: Podiatry

## 2022-05-13 ENCOUNTER — Encounter: Payer: Self-pay | Admitting: Podiatry

## 2022-05-13 DIAGNOSIS — E1142 Type 2 diabetes mellitus with diabetic polyneuropathy: Secondary | ICD-10-CM | POA: Diagnosis not present

## 2022-05-13 DIAGNOSIS — M79674 Pain in right toe(s): Secondary | ICD-10-CM | POA: Diagnosis not present

## 2022-05-13 DIAGNOSIS — B351 Tinea unguium: Secondary | ICD-10-CM | POA: Diagnosis not present

## 2022-05-13 DIAGNOSIS — M79675 Pain in left toe(s): Secondary | ICD-10-CM | POA: Diagnosis not present

## 2022-05-13 NOTE — Progress Notes (Signed)
  Subjective:  Patient ID: [name], female    DOB: [birth year],   MRN: [id number]  Chief Complaint  Patient presents with   Nail Problem    Nail trim     62 y.o. female presents for concern of thickened elongated and painful nails that are difficult to trim. Requesting to have them trimmed today. Relates burning and tingling in their feet. Patient is diabetic and last A1c was  Lab Results  Component Value Date   HGBA1C 6.0 04/09/2022   .   PCP:  Plotnikov, Georgina Quint, MD    . Denies any other pedal complaints. Denies n/v/f/c.   Past Medical History:  Diagnosis Date   ALLERGIC RHINITIS 10/26/2009   no current problems   Anxiety    B12 DEFICIENCY 08/25/2007   takes B12 supplement   Complication of anesthesia    pt has had difficulty following anesthesia with her knee in 2016-unable to care for herself afterward   Depression    takes effexor xr daily   Diabetes mellitus without complication (HCC)    was on insulin but has been off since Nov 2015 and now only takes Metformin daily   DYSPNEA 04/28/2009   uses oxygen 24/7, on 2L via [place]   Esophageal reflux    takes Nexium daily   Fibromyalgia    Headache    last migraine 2-44yrs ago;takes Topamax daily   History of shingles    Hypertension    takes Coreg daily   Insomnia    takes Nortriptyline nightly    Long-term memory loss    OSA (obstructive sleep apnea)    doesn't use CPAP;sleep study in epic from 2006   Osteoarthritis    Panic attacks    PONV (postoperative nausea and vomiting)    on time in 2016 knee surgery   Rheumatoid arthritis (HCC)    Sarcoidosis    Dr. Wilford Grist   Stroke [facility])    Vitamin D deficiency    is supposed to take Vit D but can't afford it    Objective:  Physical Exam: Vascular: DP/PT pulses 2/4 bilateral. CFT <3 seconds. Absent hair growth on digits. Edema noted to bilateral lower extremities. Xerosis noted bilaterally.  Skin. No lacerations or abrasions bilateral feet. Nails  1-5 bilateral  are thickened discolored and elongated with subungual debris.  Musculoskeletal: MMT 5/5 bilateral lower extremities in DF, PF, Inversion and Eversion. Deceased ROM in DF of ankle joint.  Neurological: Sensation intact to light touch. Protective sensation diminished bilateral.    Assessment:   1. Pain due to onychomycosis of toenails of both feet   2. Diabetic polyneuropathy associated with type 2 diabetes mellitus (HCC)      Plan:  Patient was evaluated and treated and all questions answered. -Discussed and educated patient on diabetic foot care, especially with  regards to the vascular, neurological and musculoskeletal systems.  -Stressed the importance of good glycemic control and the detriment of not  controlling glucose levels in relation to the foot. -Discussed supportive shoes at all times and checking feet regularly.  -Mechanically debrided all nails 1-5 bilateral using sterile nail nipper and filed with dremel without incident  -Answered all patient questions -Patient to return  in 3 months for at risk foot care -Patient advised to call the office if any problems or questions arise in the meantime.   Louann Sjogren, DPM

## 2022-05-14 ENCOUNTER — Other Ambulatory Visit (HOSPITAL_BASED_OUTPATIENT_CLINIC_OR_DEPARTMENT_OTHER): Payer: Self-pay

## 2022-05-14 DIAGNOSIS — J441 Chronic obstructive pulmonary disease with (acute) exacerbation: Secondary | ICD-10-CM

## 2022-05-15 ENCOUNTER — Encounter (HOSPITAL_BASED_OUTPATIENT_CLINIC_OR_DEPARTMENT_OTHER): Payer: 59

## 2022-05-15 ENCOUNTER — Encounter (HOSPITAL_BASED_OUTPATIENT_CLINIC_OR_DEPARTMENT_OTHER): Payer: Self-pay

## 2022-05-20 ENCOUNTER — Ambulatory Visit (HOSPITAL_BASED_OUTPATIENT_CLINIC_OR_DEPARTMENT_OTHER): Payer: 59 | Admitting: Pulmonary Disease

## 2022-05-21 ENCOUNTER — Other Ambulatory Visit: Payer: Self-pay | Admitting: Internal Medicine

## 2022-05-27 ENCOUNTER — Other Ambulatory Visit: Payer: Self-pay | Admitting: Internal Medicine

## 2022-05-27 NOTE — Unmapped (Signed)
[facility] Refill Coordination Note    Specialty Medication(s) to be Shipped:   Inflammatory Disorders: Humira    Other medication(s) to be shipped: No additional medications requested for fill at this time     Carolyn Newton, DOB: [birth year]  Phone: [phone] (home)       All above HIPAA information was verified with patient.     Was a Nurse, learning disability used for this call? No    Completed refill call assessment today to schedule patient's medication shipment from the [facility] [phone]).  All relevant notes have been reviewed.     Specialty medication(s) and dose(s) confirmed: Regimen is correct and unchanged.   Changes to medications: Carolyn Newton reports no changes at this time.  Changes to insurance: No  New side effects reported not previously addressed with a pharmacist or physician: None reported  Questions for the pharmacist: No    Confirmed patient received a Conservation officer, historic buildings and a Surveyor, mining with first shipment. The patient will receive a drug information handout for each medication shipped and additional FDA Medication Guides as required.       DISEASE/MEDICATION-SPECIFIC INFORMATION        For patients on injectable medications: Patient currently has 2 doses left.  Next injection is scheduled for 05/29/2022.    SPECIALTY MEDICATION ADHERENCE     Medication Adherence    Patient reported X missed doses in the last month: 0  Specialty Medication: HUMIRA(CF) PEN 40 mg/0.4 mL injection (adalimumab)  Patient is on additional specialty medications: No  Patient is on more than two specialty medications: No  Any gaps in refill history greater than 2 weeks in the last 3 months: no  Demonstrates understanding of importance of adherence: yes  Informant: patient  Confirmed plan for next specialty medication refill: delivery by pharmacy  Refills needed for supportive medications: not needed          Refill Coordination    Has the Patients' Contact Information Changed: No  Is the Shipping Address Different: No         Were doses missed due to medication being on hold? No    HUMIRA(CF) PEN 40 /0.4   mg/ml: 2 days of medicine on hand       REFERRAL TO PHARMACIST     Referral to the pharmacist: Not needed      [facility]     Shipping address confirmed in Epic.       Delivery Scheduled: Yes, Expected medication delivery date: 06/05/2022.     Medication will be delivered via UPS to the prescription address in Epic WAM.    Kerby Less   [facility] Specialty Technician

## 2022-06-03 NOTE — Progress Notes (Unsigned)
Office Visit    Patient Name: [name] Date of Encounter: 06/03/2022  Primary Care Provider:  Tresa Garter, MD Primary Cardiologist:  Charlton Haws, MD Primary Electrophysiologist: None   Past Medical History    Past Medical History:  Diagnosis Date   ALLERGIC RHINITIS 10/26/2009   no current problems   Anxiety    B12 DEFICIENCY 08/25/2007   takes B12 supplement   Complication of anesthesia    pt has had difficulty following anesthesia with her knee in 2016-unable to care for herself afterward   Depression    takes effexor xr daily   Diabetes mellitus without complication (HCC)    was on insulin but has been off since Nov 2015 and now only takes Metformin daily   DYSPNEA 04/28/2009   uses oxygen 24/7, on 2L via [place]   Esophageal reflux    takes Nexium daily   Fibromyalgia    Headache    last migraine 2-51yrs ago;takes Topamax daily   History of shingles    Hypertension    takes Coreg daily   Insomnia    takes Nortriptyline nightly    Long-term memory loss    OSA (obstructive sleep apnea)    doesn't use CPAP;sleep study in epic from 2006   Osteoarthritis    Panic attacks    PONV (postoperative nausea and vomiting)    on time in 2016 knee surgery   Rheumatoid arthritis (HCC)    Sarcoidosis    Dr. Wilford Grist   Stroke [facility])    Vitamin D deficiency    is supposed to take Vit D but can't afford it   Past Surgical History:  Procedure Laterality Date   APPENDECTOMY     arthroscopic knee surgery Right 11-12-04   AXILLARY ABCESS IRRIGATION AND DEBRIDEMENT  Jul & [id number]   BIOPSY  11/02/2019   Procedure: BIOPSY;  Surgeon: Beverley Fiedler, MD;  Location: [facility];  Service: Gastroenterology;;   Fidela Salisbury RELEASE Left 05/23/2014   Procedure: CARPAL TUNNEL RELEASE;  Surgeon: Cammy Copa, MD;  Location: [facility] OR;  Service: Orthopedics;  Laterality: Left;   CHOLECYSTECTOMY N/A 02/11/2018   Procedure: LAPAROSCOPIC CHOLECYSTECTOMY WITH  INTRAOPERATIVE CHOLANGIOGRAM ERAS PATHWAY;  Surgeon: Manus Rudd, MD;  Location[facility] OR;  Service: General;  Laterality: N/A;   COLONOSCOPY WITH PROPOFOL N/A 11/02/2019   Procedure: COLONOSCOPY WITH PROPOFOL;  Surgeon: Beverley Fiedler, MD;  Location: [facility];  Service: Gastroenterology;  Laterality: N/A;   cyst removed from top of buttocks  at age 57   DACRORHINOCYSTOTOMY Bilateral 03/23/2021   Procedure: ENDOSCOPIC DACROCYSTORHINOSTOMY;  Surgeon: Dairl Ponder, MD;  Location: [facility] OR;  Service: Ophthalmology;  Laterality: Bilateral;   ENDOMETRIAL ABLATION     IR CHOLANGIOGRAM EXISTING TUBE  11/21/2017   IR CHOLANGIOGRAM EXISTING TUBE  01/16/2018   IR EXCHANGE BILIARY DRAIN  11/10/2017   IR PATIENT EVAL TECH 0-60 MINS  02/03/2018   IR PERC CHOLECYSTOSTOMY  09/13/2017   IR RADIOLOGIST EVAL & MGMT  10/08/2017   LACRIMAL DUCT EXPLORATION Right 06/26/2017   Procedure: LACRIMAL DUCT EXPLORATION AND ETHMOIDECTOMY;  Surgeon: Floydene Flock, MD;  Location: [facility] OR;  Service: Ophthalmology;  Laterality: Right;   LACRIMAL DUCT EXPLORATION Bilateral 03/23/2021   Procedure: NASAL LACRIMAL DUCT EXPLORATION;  Surgeon: Dairl Ponder, MD;  Location: [facility] OR;  Service: Ophthalmology;  Laterality: Bilateral;   LACRIMAL TUBE INSERTION Bilateral 03/23/2021   Procedure: INSERTION OF CRAWFORD LACRIMAL STENT;  Surgeon: Dairl Ponder, MD;  Location: [facility]  OR;  Service: Ophthalmology;  Laterality: Bilateral;   ORIF FEMUR FRACTURE Left 07/31/2021   Procedure: left medial femoral condyle fracture fixation;  Surgeon: Cammy Copa, MD;  Location: [facility] OR;  Service: Orthopedics;  Laterality: Left;   POLYPECTOMY  11/02/2019   Procedure: POLYPECTOMY;  Surgeon: Beverley Fiedler, MD;  Location: [facility];  Service: Gastroenterology;;   TEAR DUCT PROBING Right 06/26/2017   Procedure: TEAR DUCT PROBING WITH STENT;  Surgeon: Floydene Flock, MD;  Location: [facility] OR;  Service: Ophthalmology;  Laterality: Right;   TOTAL KNEE  ARTHROPLASTY Right 11/15/2014   Procedure: TOTAL RIGHT KNEE ARTHROPLASTY;  Surgeon: Cammy Copa, MD;  Location: [facility] OR;  Service: Orthopedics;  Laterality: Right;   TOTAL KNEE ARTHROPLASTY Left 07/13/2015   Procedure: LEFT TOTAL KNEE ARTHROPLASTY;  Surgeon: Cammy Copa, MD;  Location: [facility] OR;  Service: Orthopedics;  Laterality: Left;    Allergies  Allergies  Allergen Reactions   Belsomra [Suvorexant] Other (See Comments)    Larey Seat out of bed while asleep: "I'm waking up as I'm falling on the floor;" "Night terrors"   Enalapril Maleate Cough   Latex Hives   Nickel Other (See Comments)    Blisters  Pt has a titanium right and left knee - nickel causes skin irritations that form into blisters and sores   Other Other (See Comments)    Patient has Sarcoidosis and can't tolerate any metals   Augmentin [Amoxicillin-Pot Clavulanate]     Purpura type rash   Morphine And Codeine Itching and Nausea Only   Doxycycline Diarrhea and Nausea And Vomiting   Hydrochlorothiazide Other (See Comments)    Low potassium levels    Hydroxychloroquine Sulfate Other (See Comments)    Vision changes    Lyrica [Pregabalin] Other (See Comments)    "Made me feel high"   Tape Other (See Comments)    Medical tape causes bruising     History of Present Illness    [name] is a 62 y.o. 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   She was initially seen in 2014 for complaint of atypical chest pain.  She underwent Lexiscan Myoview that was normal.  She had TTE completed 11/2016 with EF 60-65% no significant valve disease or signs of pulmonary HTN.     She was seen in follow-up by Dr. Eden Emms in 2020 and reported no complaints of chest pain at that time.  She had received preoperative clearance for gallbladder surgery during visit.  She was  last seen by Norma Fredrickson, NP on 08/2019 for annual follow-up.  She was doing well from the cardiovascular perspective but was having concerns with GI issues and frequent diarrhea.  She is on continuous oxygen due to sarcoidosis and is being followed by pulmonary.     Ms. Strandberg presents today for overdue follow-up.  Since last being seen in the office patient reports that she has no new cardiac complaints.  She did endorse some discomfort under her left breast that she reports is musculoskeletal and noncardiac in nature.  She is tolerating her medications without any adverse reactions.  Her blood pressure today was 110/60 and heart rate was 63 bpm..  Patient denies chest pain, palpitations, dyspnea, PND, orthopnea, nausea, vomiting, dizziness, syncope, edema, weight gain, or early satiety.   ***Notes:  Home Medications    Current Outpatient Medications  Medication Sig Dispense Refill   acetaminophen (TYLENOL) 325 MG tablet Take 1-2 tablets (325-650 mg total) by mouth every 6 (six) hours as needed for mild pain (pain score 1-3 or temp > 100.5). 30 tablet 1   albuterol (PROVENTIL) (2.5 MG/3ML) 0.083% nebulizer solution Take 3 mLs (2.5 mg total) by nebulization every 6 (six) hours as needed for wheezing or shortness of breath. 75 mL 5   albuterol (VENTOLIN HFA) 108 (90 Base) MCG/ACT inhaler INHALE TWO puffs into THE lungs EVERY SIX HOURS AS NEEDED wheezind AND For SHORTNESS OF BREATH 6.7 g 3   B-D ULTRA-FINE 33 LANCETS MISC Use as instructed to test sugars once  daily 100 each 12   budesonide-formoterol (SYMBICORT) 160-4.5 MCG/ACT inhaler INHALE TWO puffs into THE lungs TWICE DAILY (Patient not taking: Reported on 04/30/2022) 10.2 g 5   carvedilol (COREG) 12.5 MG tablet Take 1 tablet (12.5 mg total) by mouth 2 (two) times daily with a meal. 180 tablet 1   cefdinir (OMNICEF) 300 MG capsule Take 1 capsule (300 mg total) by mouth 2 (two) times daily. 14 capsule 0   Cholecalciferol (VITAMIN D-3) 125  MCG (5000 UT) TABS Take 5,000 Units by mouth daily.     Cyanocobalamin (VITAMIN B-12) 1000 MCG SUBL Place 1 tablet (1,000 mcg total) under the tongue daily. (Patient taking differently: Place 1,000 mcg under the tongue daily.) 100 tablet 3   dexlansoprazole (DEXILANT) 60 MG capsule TAKE ONE CAPSULE BY MOUTH EVERY DAY 90 capsule 1   Docusate Sodium (DSS) 100 MG CAPS Take by mouth.     fentaNYL (DURAGESIC) 25 MCG/HR Place 1 patch onto the skin every 3 (three) days. 10 patch 0   gabapentin (NEURONTIN) 300 MG capsule TAKE ONE CAPSULE BY MOUTH EVERY MORNING **May take a second capsule during THE DAY AS NEEDED FOR PAIN 180 capsule 0   glucose blood (ACCU-CHEK GUIDE) test strip Use to check blood sugar once a day. 100 each 12   HUMIRA PEN 40 MG/0.4ML PNKT Inject 40 mg as directed every Wednesday.      HYDROmorphone (DILAUDID) 8 MG tablet Take 1 tablet (8 mg total) by mouth 2 (two) times daily as needed for severe pain. 60 tablet 0   hydrOXYzine (ATARAX) 25 MG tablet Take 1 tablet (25 mg total) by mouth every 8 (eight) hours as needed. 60 tablet 3   ketoconazole (NIZORAL) 2 % cream Apply 1 Application topically 2 (two) times daily. Apply under breasts 45 g 1   lidocaine (XYLOCAINE) 2 % solution Use as directed 15 mLs in the mouth or throat every 6 (six) hours as needed for mouth pain. 100 mL 0   metFORMIN (GLUCOPHAGE-XR) 500 MG 24 hr tablet Take 2 tablets (1,000 mg total) by mouth at bedtime. 180 tablet 1   methocarbamol (ROBAXIN) 500 MG tablet Take 1 tablet (500 mg total) by mouth every 6 (six) hours as needed for muscle spasms. 30 tablet 0   Multiple Vitamins-Minerals (MULTIVITAMIN WOMEN 50+) TABS Take 1 tablet by mouth daily.     mupirocin ointment (BACTROBAN) 2 % On leg wound w/dressing change qd or bid 30 g 0   naloxone (NARCAN) nasal spray 4 mg/0.1 mL Place 1 spray into the nose once as needed (overdose).     naltrexone (DEPADE) 50 MG tablet Take 50 mg by mouth daily.     nystatin ointment  (MYCOSTATIN) Apply 1 Application topically 2 (two) times daily. 100 g 0   ofloxacin (FLOXIN) 0.3 % OTIC solution  OXYGEN Inhale 2 L into the lungs continuous.     polyvinyl alcohol (LIQUIFILM TEARS) 1.4 % ophthalmic solution Place 1 drop into both eyes as needed for dry eyes. 15 mL 0   potassium chloride (KLOR-CON M) 10 MEQ tablet TAKE ONE TABLET BY MOUTH EVERY DAY 90 tablet 1   predniSONE (DELTASONE) 10 MG tablet 2 tabs daily  for 7 days, then 1 tab daily for 7 days, then stop 21 tablet 0   QUEtiapine (SEROQUEL) 50 MG tablet Take 1 tab in am and 2 tab at bedtime 90 tablet 5   rivaroxaban (XARELTO) 20 MG TABS tablet TAKE ONE TABLET BY MOUTH EVERY DAY 90 tablet 3   topiramate (TOPAMAX) 50 MG tablet TAKE ONE TABLET BY MOUTH TWICE DAILY 180 tablet 1   venlafaxine XR (EFFEXOR-XR) 75 MG 24 hr capsule Take 1 capsule (75 mg total) by mouth daily with breakfast. 30 capsule 11   No current facility-administered medications for this visit.     Review of Systems  Please see the history of present illness.    (+)*** (+)***  All other systems reviewed and are otherwise negative except as noted above.  Physical Exam    Wt Readings from Last 3 Encounters:  04/29/22 268 lb (121.6 kg)  04/18/22 268 lb 6.4 oz (121.7 kg)  04/15/22 270 lb 6.4 oz (122.7 kg)   ZO:XWRUE were no vitals filed for this visit.,There is no height or weight on file to calculate BMI.  Constitutional:      Appearance: Healthy appearance. Not in distress.  Neck:     Vascular: JVD normal.  Pulmonary:     Effort: Pulmonary effort is normal.     Breath sounds: No wheezing. No rales. Diminished in the bases Cardiovascular:     Normal rate. Regular rhythm. Normal S1. Normal S2.      Murmurs: There is no murmur.  Edema:    Peripheral edema absent.  Abdominal:     Palpations: Abdomen is soft non tender. There is no hepatomegaly.  Skin:    General: Skin is warm and dry.  Neurological:     General: No focal deficit  present.     Mental Status: Alert and oriented to person, place and time.     Cranial Nerves: Cranial nerves are intact.  EKG/LABS/ Recent Cardiac Studies    ECG personally reviewed by me today -sinus rhythm with rate of 63 bpm and no acute changes consistent with previous EKG.  Cardiac Studies & Procedures       ECHOCARDIOGRAM  ECHOCARDIOGRAM COMPLETE 11/21/2016  Narrative *[facility]* [address] [place], [place] [address] [phone]  ------------------------------------------------------------------- Transthoracic Echocardiography  Patient:    [name], [name] MR #:       [id number] Study Date: 11/21/2016 Gender:     F Age:        56 Height:     170.2 cm Weight:     145.9 kg BSA:        2.71 m^2 Pt. Status: Room:  SONOGRAPHER  Cathie Beams ORDERING     [place], Tammy S REFERRING    [place], Tammy S ATTENDING    Marca Ancona, M.D. PERFORMING   [facility], Outpatient  cc:  ------------------------------------------------------------------- LV EF: 60% -   65%  ------------------------------------------------------------------- Indications:      R06.02 Shortness of breath.  ------------------------------------------------------------------- History:   PMH:  Sarcoidosis. Fibromyalgia. Obstructive sleep apnea.  Transient ischemic attack.  Risk factors:  Current tobacco use. Diabetes mellitus.  -------------------------------------------------------------------  Study Conclusions  - Left ventricle: The cavity size was normal. Wall thickness was increased in a pattern of mild LVH. Systolic function was normal. The estimated ejection fraction was in the range of 60% to 65%. Although no diagnostic regional wall motion abnormality was identified, this possibility cannot be completely excluded on the basis of this study. Doppler parameters are consistent with abnormal left ventricular relaxation (grade 1 diastolic dysfunction). - Aortic valve:  There was no stenosis. - Mitral valve: There was no significant regurgitation. - Right ventricle: The cavity size was normal. Systolic function was normal. - Pulmonary arteries: No complete TR doppler jet so unable to estimate PA systolic pressure. - Systemic veins: IVC not visualized.  Impressions:  - Normal LV size with mild LV hypertrophy. EF 60-65%. Normal RV size and systolic function. No significant valvular abnormalities.  ------------------------------------------------------------------- Study data:  Comparison was made to the study of 08/18/2015.  Study status:  Routine.  Procedure:  The patient reported no pain pre or post test. Transthoracic echocardiography. Image quality was adequate.  Study completion:  There were no complications. Transthoracic echocardiography.  M-mode, complete 2D, spectral Doppler, and color Doppler.  Birthdate:  Patient birthdate: 03/24/1960.  Age:  Patient is 62 yr old.  Sex:  Gender: female. BMI: 50.4 kg/m^2.  Blood pressure:     133/89  Patient status: Outpatient.  Study date:  Study date: 11/21/2016. Study time: 11:25 AM.  Location:  [facility]  -------------------------------------------------------------------  ------------------------------------------------------------------- Left ventricle:  The cavity size was normal. Wall thickness was increased in a pattern of mild LVH. Systolic function was normal. The estimated ejection fraction was in the range of 60% to 65%. Although no diagnostic regional wall motion abnormality was identified, this possibility cannot be completely excluded on the basis of this study. Doppler parameters are consistent with abnormal left ventricular relaxation (grade 1 diastolic dysfunction).  ------------------------------------------------------------------- Aortic valve:   Trileaflet.  Doppler:   There was no stenosis. There was no  regurgitation.  ------------------------------------------------------------------- Aorta:  Aortic root: The aortic root was normal in size. Ascending aorta: The ascending aorta was normal in size.  ------------------------------------------------------------------- Mitral valve:   Normal thickness leaflets .  Doppler:   There was no evidence for stenosis.   There was no significant regurgitation. Peak gradient (D): 2 mm Hg.  ------------------------------------------------------------------- Left atrium:  The atrium was normal in size.  ------------------------------------------------------------------- Right ventricle:  The cavity size was normal. Systolic function was normal.  ------------------------------------------------------------------- Pulmonic valve:    Structurally normal valve.   Cusp separation was normal.  Doppler:  Transvalvular velocity was within the normal range. There was no regurgitation.  ------------------------------------------------------------------- Tricuspid valve:   Doppler:  There was no significant regurgitation.  ------------------------------------------------------------------- Pulmonary artery:   No complete TR doppler jet so unable to estimate PA systolic pressure.  ------------------------------------------------------------------- Right atrium:  The atrium was normal in size.  ------------------------------------------------------------------- Pericardium:  There was no pericardial effusion.  ------------------------------------------------------------------- Systemic veins:  IVC not visualized.  ------------------------------------------------------------------- Measurements  Left ventricle                           Value        Reference LV ID, ED, PLAX chordal                  49.6  mm     43 - 52 LV ID, ES, PLAX chordal  30.4  mm     23 - 38 LV fx shortening, PLAX chordal           39    %      >=29 LV PW  thickness, ED                      12.4  mm     --------- IVS/LV PW ratio, ED                      1.1          <=1.3 LV e&', lateral                           14.7  cm/s   --------- LV E/e&', lateral                         5            --------- LV e&', medial                            7.35  cm/s   --------- LV E/e&', medial                          10           --------- LV e&', average                           11.03 cm/s   --------- LV E/e&', average                         6.67         ---------  Ventricular septum                       Value        Reference IVS thickness, ED                        13.6  mm     ---------  LVOT                                     Value        Reference LVOT ID, S                               22    mm     --------- LVOT area                                3.8   cm^2   ---------  Aorta                                    Value        Reference Aortic root ID, ED                       35  mm     ---------  Left atrium                              Value        Reference LA ID, A-P, ES                           36    mm     --------- LA ID/bsa, A-P                           1.33  cm/m^2 <=2.2 LA volume, S                             32    ml     --------- LA volume/bsa, S                         11.8  ml/m^2 --------- LA volume, ES, 1-p A4C                   30    ml     --------- LA volume/bsa, ES, 1-p A4C               11.1  ml/m^2 --------- LA volume, ES, 1-p A2C                   32    ml     --------- LA volume/bsa, ES, 1-p A2C               11.8  ml/m^2 ---------  Mitral valve                             Value        Reference Mitral E-wave peak velocity              73.5  cm/s   --------- Mitral A-wave peak velocity              66.1  cm/s   --------- Mitral deceleration time       (H)       292   ms     150 - 230 Mitral peak gradient, D                  2     mm Hg  --------- Mitral E/A ratio, peak                   1.1           ---------  Right ventricle                          Value        Reference RV s&', lateral, S                        13.5  cm/s   ---------  Legend: (L)  and  (H)  mark values outside specified reference range.  ------------------------------------------------------------------- Prepared and Electronically Authenticated by  Marca Ancona, M.D. 2018-11-08T17:11:48             Risk Assessment/Calculations:   {Does this patient have ATRIAL FIBRILLATION?[phone]}  Lab Results  Component Value Date   WBC 7.1 12/10/2021   HGB 12.7 12/10/2021   HCT 38.3 12/10/2021   MCV 96.3 12/10/2021   PLT 214.0 12/10/2021   Lab Results  Component Value Date   CREATININE 0.94 12/10/2021   BUN 13 12/10/2021   NA 137 12/10/2021   K 3.9 12/10/2021   CL 103 12/10/2021   CO2 28 12/10/2021   Lab Results  Component Value Date   ALT 13 12/10/2021   AST 19 12/10/2021   ALKPHOS 77 12/10/2021   BILITOT 0.3 12/10/2021   Lab Results  Component Value Date   CHOL 210 (H) 07/19/2020   HDL 77.20 07/19/2020   LDLCALC 111 (H) 07/19/2020   LDLDIRECT 107.0 06/30/2017   TRIG 109.0 07/19/2020   CHOLHDL 3 07/19/2020    Lab Results  Component Value Date   HGBA1C 6.0 04/09/2022     Assessment & Plan    1.  Nonobstructive CAD: -Coronary atherosclerosis noted on CT of the chest in 2020 -Patient currently not on ASA due to Xarelto -Today patient reports***   2. .  Essential hypertension: -Blood pressure today was*** -Continue carvedilol 12.5 mg twice daily   3.  Pulmonary sarcoidosis: -2D echo completed 11/2016 with EF 60-65% no significant valve disease or signs of pulmonary HTN.     4.  History of PE/DVT: -lifelong anticoagulation for history of PE/DVT -Continue Xarelto 20 mg daily     Disposition: Follow-up with Charlton Haws, MD or APP in *** months {Are you ordering a CV Procedure (e.g. stress test, cath, DCCV, TEE, etc)?   Press F2        :[id number]}   Medication  Adjustments/Labs and Tests Ordered: Current medicines are reviewed at length with the patient today.  Concerns regarding medicines are outlined above.   Signed, Napoleon Form, Leodis Rains, NP 06/03/2022, 6:58 PM [facility]

## 2022-06-04 ENCOUNTER — Encounter: Payer: Self-pay | Admitting: Nurse Practitioner

## 2022-06-04 ENCOUNTER — Ambulatory Visit: Payer: 59 | Attending: Cardiology | Admitting: Nurse Practitioner

## 2022-06-04 VITALS — BP 110/60 | HR 63 | Ht 67.0 in | Wt 266.4 lb

## 2022-06-04 DIAGNOSIS — I2782 Chronic pulmonary embolism: Secondary | ICD-10-CM

## 2022-06-04 DIAGNOSIS — R918 Other nonspecific abnormal finding of lung field: Secondary | ICD-10-CM | POA: Diagnosis not present

## 2022-06-04 DIAGNOSIS — D860 Sarcoidosis of lung: Secondary | ICD-10-CM | POA: Diagnosis not present

## 2022-06-04 DIAGNOSIS — I2510 Atherosclerotic heart disease of native coronary artery without angina pectoris: Secondary | ICD-10-CM

## 2022-06-04 DIAGNOSIS — I10 Essential (primary) hypertension: Secondary | ICD-10-CM

## 2022-06-04 DIAGNOSIS — I825Y1 Chronic embolism and thrombosis of unspecified deep veins of right proximal lower extremity: Secondary | ICD-10-CM | POA: Diagnosis not present

## 2022-06-04 DIAGNOSIS — D869 Sarcoidosis, unspecified: Secondary | ICD-10-CM | POA: Diagnosis not present

## 2022-06-04 MED FILL — HUMIRA PEN CITRATE FREE 40 MG/0.4 ML: SUBCUTANEOUS | 28 days supply | Qty: 4 | Fill #1

## 2022-06-04 NOTE — Patient Instructions (Signed)
Medication Instructions:  Your physician recommends that you continue on your current medications as directed. Please refer to the Current Medication list given to you today. *If you need a refill on your cardiac medications before your next appointment, please call your pharmacy*   Lab Work: None ordered   Testing/Procedures: None ordered   Follow-Up: At [facility], you and your health needs are our priority.  As part of our continuing mission to provide you with exceptional heart care, we have created designated Provider Care Teams.  These Care Teams include your primary Cardiologist (physician) and Advanced Practice Providers (APPs -  Physician Assistants and Nurse Practitioners) who all work together to provide you with the care you need, when you need it.  We recommend signing up for the patient portal called "MyChart".  Sign up information is provided on this After Visit Summary.  MyChart is used to connect with patients for Virtual Visits (Telemedicine).  Patients are able to view lab/test results, encounter notes, upcoming appointments, etc.  Non-urgent messages can be sent to your provider as well.   To learn more about what you can do with MyChart, go to [email or web address].    Your next appointment:   12 month(s)  Provider:   Charlton Haws, MD     Other Instructions  You have been referred to PULMONARY REHAB.

## 2022-06-05 ENCOUNTER — Encounter: Payer: Self-pay | Admitting: Nurse Practitioner

## 2022-06-05 ENCOUNTER — Telehealth: Payer: Self-pay | Admitting: Internal Medicine

## 2022-06-05 NOTE — Telephone Encounter (Signed)
Called pt she states she can not used the fentayl patches. Suppose to keep on for 3 days can only wear patch for an hour or two. Irritate and burn skin. Pt was current on hydromorphone ( see phone note 04/24/22). She said her insurance will only cover hydromorphone, morphine, and fentanyl. Pls advise

## 2022-06-05 NOTE — Telephone Encounter (Signed)
Pt was scheduled for this Friday with Dr Okey Dupre

## 2022-06-05 NOTE — Telephone Encounter (Signed)
Will need visit with any provider. We do not make changes to chronic pain regimen over the phone.

## 2022-06-05 NOTE — Telephone Encounter (Signed)
Patient states that the fentnyl patches are burning her skin - she would like something else called in.  Please call patient at [phone]

## 2022-06-06 ENCOUNTER — Ambulatory Visit
Admission: RE | Admit: 2022-06-06 | Discharge: 2022-06-06 | Disposition: A | Discharge status: Home / Self Care | Payer: 59 | Source: Ambulatory Visit | Attending: Adult Health | Admitting: Adult Health

## 2022-06-06 DIAGNOSIS — I700 Atherosclerosis of aorta: Secondary | ICD-10-CM | POA: Diagnosis not present

## 2022-06-06 DIAGNOSIS — D869 Sarcoidosis, unspecified: Secondary | ICD-10-CM | POA: Diagnosis not present

## 2022-06-06 DIAGNOSIS — I2510 Atherosclerotic heart disease of native coronary artery without angina pectoris: Secondary | ICD-10-CM | POA: Diagnosis not present

## 2022-06-06 DIAGNOSIS — I517 Cardiomegaly: Secondary | ICD-10-CM | POA: Diagnosis not present

## 2022-06-06 DIAGNOSIS — J984 Other disorders of lung: Secondary | ICD-10-CM | POA: Diagnosis not present

## 2022-06-07 ENCOUNTER — Ambulatory Visit (INDEPENDENT_AMBULATORY_CARE_PROVIDER_SITE_OTHER): Payer: 59 | Admitting: Internal Medicine

## 2022-06-07 ENCOUNTER — Encounter: Payer: Self-pay | Admitting: Internal Medicine

## 2022-06-07 VITALS — BP 140/98 | HR 70 | Temp 97.9°F | Ht 67.0 in | Wt 268.0 lb

## 2022-06-07 DIAGNOSIS — J449 Chronic obstructive pulmonary disease, unspecified: Secondary | ICD-10-CM | POA: Diagnosis not present

## 2022-06-07 DIAGNOSIS — G894 Chronic pain syndrome: Secondary | ICD-10-CM

## 2022-06-07 MED ORDER — MORPHINE SULFATE 15 MG PO TABS: 15.0000 mg | ORAL_TABLET | Freq: Two times a day (BID) | ORAL | 0 refills | Status: DC | PRN

## 2022-06-07 NOTE — Patient Instructions (Addendum)
We have sent in an equivalent dose of the medicine in morphine which is 15 mg to take up to twice a day  I do recommend to follow up with your regular doctor within a month and establish a chronic pain contract

## 2022-06-07 NOTE — Assessment & Plan Note (Signed)
Complicated recent history of medications. Prior from another provider in 2022 with oxycodone 10/325 120 per month that they appear to have de-escalated to #60 7.5/325 oxycodone per month. Then her PCP appears to have taken over and prescribed several different substances including oxy ir 10 BID, then hydromorphone 4 mg BID (this was titrated to 8 mg BID but she was unable to find any of it) then changed to fentanyl patches. She states she has used 3-4 of those and left them on for a matter of hours each due to irritation and burning. She admits to last using fentanyl patch 4-5 days ago. No other pain medications used recently. I also note clonazepam in her fill history which she states she is not taking any longer. She does not have a recent UDS or pain contract. I obtained UDS today and reviewed PDMP. I have prescribed 2 week supply of morphine 15 mg BID until PCP will be back in the office. I have asked her to follow up within 1 month with PCP to establish a pain contract for her benefit. She is aware that I will not be her chronic pain provider.

## 2022-06-07 NOTE — Progress Notes (Signed)
   Subjective:   Patient ID: [name], female    DOB: 10/12/1960, 62 y.o.   MRN: [id number]  HPI The patient is a 62 YO female coming in for chronic pain adjustment. PCP is out of office and she was unable to get her 4 mg BID hydromorphone so was changed to fentanyl 25 mcg patches by PCP.  It appears per PDMP that prior to Feb 2024 she was maintained on her chronic oxycodone 10/325 (120 per month for 2022, then #60 7.5/325 twice in 2023, then oxy ir 10 mg #60 08/2021 then dilaudid 8 mg BID March 2024 then fentanyl 25 mcg daily 05/03/22) These are causing irritation to skin and needs alternative. No prior UDS on file no pain contract on file. She is currently taking fentanyl patch 25 mcg q 3 days. In the last month she has used 3-4 and can only leave on about 2 hours. Last time she used one was 4-5 days ago.   Review of Systems  Constitutional:  Positive for activity change.  HENT: Negative.    Eyes: Negative.   Respiratory:  Negative for cough, chest tightness and shortness of breath.   Cardiovascular:  Negative for chest pain, palpitations and leg swelling.  Gastrointestinal:  Negative for abdominal distention, abdominal pain, constipation, diarrhea, nausea and vomiting.  Musculoskeletal:  Positive for arthralgias, back pain and gait problem.  Skin: Negative.   Psychiatric/Behavioral: Negative.      Objective:  Physical Exam Constitutional:      Appearance: She is well-developed.  HENT:     Head: Normocephalic and atraumatic.  Cardiovascular:     Rate and Rhythm: Normal rate and regular rhythm.  Pulmonary:     Effort: Pulmonary effort is normal. No respiratory distress.     Breath sounds: Normal breath sounds. No wheezing or rales.     Comments: On chronic oxygen Abdominal:     General: Bowel sounds are normal. There is no distension.     Palpations: Abdomen is soft.     Tenderness: There is no abdominal tenderness. There is no rebound.  Musculoskeletal:         General: Tenderness present.     Cervical back: Normal range of motion.  Skin:    General: Skin is warm and dry.  Neurological:     Mental Status: She is alert and oriented to person, place, and time.     Coordination: Coordination abnormal.     Comments: Rolling walker     Vitals:   06/07/22 1426  BP: (!) 140/100  Pulse: 70  Temp: 97.9 F (36.6 C)  TempSrc: Oral  SpO2: 97%  Weight: 268 lb (121.6 kg)  Height: 5\' 7"  (1.702 m)    Assessment & Plan:  Visit time 15 minutes in face to face communication with patient and coordination of care, additional 15 minutes spent in record review, coordination or care, ordering tests, communicating/referring to other healthcare professionals, documenting in medical records all on the same day of the visit for total time 30 minutes spent on the visit.

## 2022-06-09 LAB — DRUG MONITOR, TRAMADOL,QN, URINE
Desmethyltramadol: NEGATIVE ng/mL (ref <100)
Tramadol: NEGATIVE ng/mL (ref <100)

## 2022-06-09 LAB — PRESCRIBED DRUGS,MEDMATCH(R)

## 2022-06-09 LAB — DRUG MONITOR,AMPHETAMINE,W/CONF, URINE: Amphetamines: NEGATIVE ng/mL (ref <500)

## 2022-06-09 LAB — DRUG MONITOR,BARBITURATE,W/CONF, URINE: Barbiturates: NEGATIVE ng/mL (ref <300)

## 2022-06-09 LAB — DM TEMPLATE

## 2022-06-09 LAB — DRUG MONITOR, COCAINEMETAB, W/CONF, URINE: Cocaine Metabolite: NEGATIVE ng/mL (ref <150)

## 2022-06-09 LAB — DRUG MONITOR, BENZO,W/CONF, URINE: Benzodiazepines: NEGATIVE ng/mL (ref <100)

## 2022-06-09 LAB — DRUG MONITOR, OXYCODONE,W/CONF, URINE: Oxycodone: NEGATIVE ng/mL (ref <100)

## 2022-06-09 LAB — DRUG MONITOR, OPIATES,W/CONF, URINE: Opiates: NEGATIVE ng/mL (ref <100)

## 2022-06-11 ENCOUNTER — Other Ambulatory Visit: Payer: Self-pay

## 2022-06-11 DIAGNOSIS — J438 Other emphysema: Secondary | ICD-10-CM

## 2022-06-12 ENCOUNTER — Other Ambulatory Visit: Payer: Self-pay | Admitting: Adult Health

## 2022-06-12 MED ORDER — CEFDINIR 300 MG PO CAPS
300.0000 mg | ORAL_CAPSULE | Freq: Two times a day (BID) | ORAL | 0 refills | Status: DC
Start: 2022-06-12 — End: 2022-12-02

## 2022-06-14 ENCOUNTER — Telehealth: Payer: Self-pay | Admitting: *Deleted

## 2022-06-14 ENCOUNTER — Ambulatory Visit: Payer: 59

## 2022-06-14 ENCOUNTER — Other Ambulatory Visit: Payer: Self-pay | Admitting: *Deleted

## 2022-06-14 DIAGNOSIS — T17500A Unspecified foreign body in bronchus causing asphyxiation, initial encounter: Secondary | ICD-10-CM

## 2022-06-14 DIAGNOSIS — D860 Sarcoidosis of lung: Secondary | ICD-10-CM | POA: Diagnosis not present

## 2022-06-14 DIAGNOSIS — M1990 Unspecified osteoarthritis, unspecified site: Secondary | ICD-10-CM | POA: Diagnosis not present

## 2022-06-14 DIAGNOSIS — R911 Solitary pulmonary nodule: Secondary | ICD-10-CM | POA: Diagnosis not present

## 2022-06-14 NOTE — Telephone Encounter (Signed)
Patient seen in the office today and instructed on use of flutter valve.  Patient expressed understanding and demonstrated technique.  

## 2022-06-14 NOTE — Progress Notes (Signed)
Nothing further needed 

## 2022-06-14 NOTE — Progress Notes (Signed)
Patient instructed on use of flutter valve.  

## 2022-06-20 ENCOUNTER — Other Ambulatory Visit: Payer: Self-pay | Admitting: Internal Medicine

## 2022-06-20 ENCOUNTER — Telehealth: Payer: Self-pay | Admitting: Internal Medicine

## 2022-06-20 MED ORDER — GABAPENTIN 300 MG PO CAPS: ORAL_CAPSULE | ORAL | 0 refills | Status: DC

## 2022-06-20 NOTE — Telephone Encounter (Signed)
Please call to discuss pain management - patient did not want to have an appointment.  Patient's number:  [phone]

## 2022-06-20 NOTE — Telephone Encounter (Signed)
Pt called back the Hydromorphone is still on bak order. She states can she get high dosage on the Morphine 15 mg because she still be hurting. Pls send to [facility]...[name]

## 2022-06-20 NOTE — Telephone Encounter (Signed)
Called pt she states whn MD was out of the office she saw Dr. Okey Dupre for the burning on her back from the Fentanyl patches. Dr. Okey Dupre rx her 14 days of morphine and now she is out. Pt states the morphine does not really work for her She would like MD to rx her old Hydromorphone instead. She states she was going to call the pharmacy to make sure they have in stock,,,/lmb

## 2022-06-21 ENCOUNTER — Other Ambulatory Visit: Payer: Self-pay | Admitting: Internal Medicine

## 2022-06-24 DIAGNOSIS — H905 Unspecified sensorineural hearing loss: Secondary | ICD-10-CM | POA: Diagnosis not present

## 2022-06-24 MED ORDER — MORPHINE SULFATE 15 MG PO TABS: 15.0000 mg | ORAL_TABLET | Freq: Two times a day (BID) | ORAL | 0 refills | Status: DC | PRN

## 2022-06-24 NOTE — Telephone Encounter (Signed)
The prescription was emailed.  Thanks

## 2022-07-01 MED ORDER — EMPTY CONTAINER
0 refills | 0 days
Start: 2022-07-01 — End: ?

## 2022-07-01 NOTE — Unmapped (Signed)
[facility] Clinical Assessment & Refill Coordination Note    Carolyn Newton, DOB: Aug 11, 1960  Phone: [phone] (home)     All above HIPAA information was verified with patient.     Was a Nurse, learning disability used for this call? No    Specialty Medication(s):   Inflammatory Disorders: Humira     Current Outpatient Medications   Medication Sig Dispense Refill    albuterol (PROVENTIL HFA;VENTOLIN HFA) 90 mcg/actuation inhaler Inhale 2 puffs.      albuterol HFA 90 mcg/actuation inhaler Inhale 2 puffs.      amoxicillin-clavulanate (AUGMENTIN) 875-125 mg per tablet Take 1 tablet by mouth every twelve (12) hours.      benzonatate (TESSALON) 200 MG capsule Take 200 mg by mouth.      blood sugar diagnostic (ON CALL EXPRESS TEST STRIP) Strp Use as instructed to check blood sugar once a day. 100 each 12    blood sugar diagnostic Strp TEST TWICE DAILY      blood-glucose meter (ON CALL EXPRESS METER) Misc Use to check blood sugar once a day 1 each 0    blood-glucose meter kit Use to check blood sugar 2 times per day      carvedilol (COREG) 12.5 MG tablet TAKE 1 TABLET BY MOUTH TWICE DAILY WITH A MEAL      chlorhexidine (PERIDEX) 0.12 % solution 5 mL.      clobetasoL (TEMOVATE) 0.05 % cream Apply topically Two (2) times a day. 60 g 5    clonazePAM (KLONOPIN) 0.5 MG tablet       dexlansoprazole (DEXILANT) 60 mg capsule       diclofenac sodium (VOLTAREN) 1 % gel Apply 2 g topically 2 (two) times daily as needed (for pain). 100/4=25      docusate sodium (COLACE) 100 MG capsule Take 100 mg by mouth.      empty container (SHARPS CONTAINER) Misc Use as directed to dispose of Humira needles 1 each 2    empty container Misc USE AS DIRECTED 1 each 2    empty container Misc use as directed 1 each 0    escitalopram oxalate (LEXAPRO) 20 MG tablet TAKE 1 TABLET BY MOUTH DAILY      esomeprazole (NEXIUM) 40 MG capsule Take 40 mg by mouth.      fluticasone propionate (FLONASE) 50 mcg/actuation nasal spray 1 spray into each nostril.      furosemide (LASIX) 20 MG tablet Take 20 mg by mouth.      gabapentin (NEURONTIN) 300 MG capsule Take 1 capsule (300 mg total) by mouth 2 (two) times daily as needed (for pain).  1    HUMIRA PEN CITRATE FREE 40 MG/0.4 ML Inject the contents of 1 pen (40 mg total) under the skin every seven (7) days. 4 each 11    hydrOXYzine (ATARAX) 25 MG tablet Take 1/4 tablet in the morning and 1/2 tablet in the evenings as needed for itch 30 tablet 5    hydrOXYzine (ATARAX) 25 MG tablet Take 25 mg by mouth.      lancets 28 gauge Misc 1 each.      lancets 30 gauge Misc Use as instructed to test sugars once daily 100 each 12    levoFLOXacin (LEVAQUIN) 750 MG tablet Take 750 mg by mouth.      lidocaine (LIDODERM) 5 % patch Place 1 patch onto the skin daily as needed. Apply patch to area most significant pain once per day. Remove and discard patch within  12 hours of application.      metFORMIN (GLUCOPHAGE) 500 MG tablet TAKE 2 TABLETS(1000 MG) BY MOUTH DAILY WITH FOOD      metFORMIN (GLUCOPHAGE-XR) 500 MG 24 hr tablet Take 2 tablets (1,000 mg total) by mouth at bedtime.      methocarbamoL (ROBAXIN) 500 MG tablet       moxifloxacin (AVELOX) 400 mg tablet Take 1 tablet (400 mg total) by mouth daily. 7 tablet 0    multivitamin with minerals tablet Take 1 tablet by mouth daily.      naloxone (NARCAN) 4 mg nasal spray Call 911 Instill ONE SPRAY into one nostril. REPEAT EVERY FIVE MINUTES if no OR minimal response      naltrexone (DEPADE) 50 mg tablet Split tablets into quarters. Take one-quarter of a tablet once daily 30 tablet 11    neomycin-polymyxin-dexamethasone (MAXITROL) 3.5mg /mL-10,000 unit/mL-0.1 % ophthalmic suspension SHAKE LIQUID AND INSTILL 1 DROP IN LEFT EYE THREE TIMES DAILY      nortriptyline (PAMELOR) 75 MG capsule Take 75 mg by mouth.      nystatin (MYCOSTATIN) 100,000 unit/mL suspension       omeprazole (PRILOSEC) 20 MG capsule Take 20 mg by mouth.      oxyCODONE-acetaminophen (PERCOCET) 10-325 mg per tablet 1 Tablet Four Times A Day AS NEEDED.      oxyCODONE-acetaminophen (PERCOCET) 5-325 mg per tablet 1 Tablet Four Times A Day as needed.      potassium chloride (KLOR-CON) 10 MEQ CR tablet TAKE 1 TABLET BY MOUTH DAILY      potassium chloride (KLOR-CON) 10 MEQ CR tablet Take 1 tablet by mouth daily.      predniSONE (DELTASONE) 20 MG tablet Take 20 mg by mouth.      rivaroxaban (XARELTO) 20 mg tablet TAKE 1 TABLET BY MOUTH EVERY DAY WITH SUPPER      suvorexant (BELSOMRA) 20 mg tablet Take 20 mg by mouth.      SYMBICORT 160-4.5 mcg/actuation inhaler Inhale 2 puffs into the lungs TWICE DAILY 120/4=30  5    topiramate (TOPAMAX) 50 MG tablet TAKE 1 TABLET BY MOUTH TWICE DAILY      topiramate (TOPAMAX) 50 MG tablet Take 50 mg by mouth.      venlafaxine (EFFEXOR-XR) 75 MG 24 hr capsule Take 75 mg by mouth.       Current Facility-Administered Medications   Medication Dose Route Frequency Provider Last Rate Last Admin    triamcinolone acetonide (KENALOG) injection 10 mg  10 mg Intradermal Once Elsie Stain, MD            Changes to medications: [name] reports no changes at this time.    Allergies   Allergen Reactions    Other Other (See Comments)     Patient has Sarcoidosis and can't tolerate any metals    Suvorexant Other (See Comments)     Larey Seat out of bed while asleep: I'm waking up as I'm falling on the floor; Night terrors    Enalapril Maleate      REACTION: cough    Hydrochlorothiazide      REACTION: Low potassium    Hydroxychloroquine Sulfate      REACTION: vision changes    Iodine      Mother was intolerant--CODED on CT table---pt never tired.    Latex      Hives    Nickel Other (See Comments)     Pt has a titanium right and left knee - nickel causes skin irritations that forms  into blisters and sores  Other reaction(s): Other (See Comments)  Pt has a titanium right knee - nickel causes skin irritations that forms into blisters and sores    Adhesive Tape-Silicones Other (See Comments) Medical tape causes bruising    Doxycycline Diarrhea and Nausea And Vomiting    Morphine Itching and Nausea Only    Pregabalin Other (See Comments)     Made me feel high       Changes to allergies: No    SPECIALTY MEDICATION ADHERENCE     HUMIRA PEN CITRATE FREE 40 MG/0.4 ML: 0 days of medicine on hand   Medication Adherence    Patient reported X missed doses in the last month: 0  Specialty Medication: HUMIRA PEN CITRATE FREE 40 MG/0.4 ML  Informant: patient  Confirmed plan for next specialty medication refill: delivery by pharmacy  Refills needed for supportive medications: not needed          Specialty medication(s) dose(s) confirmed: Regimen is correct and unchanged.     Are there any concerns with adherence? No    Adherence counseling provided? Not needed    CLINICAL MANAGEMENT AND INTERVENTION      Clinical Benefit Assessment:    Do you feel the medicine is effective or helping your condition? Yes    Clinical Benefit counseling provided?  Not needed    Adverse Effects Assessment:    Are you experiencing any side effects? No    Are you experiencing difficulty administering your medicine? No    Quality of Life Assessment:    Quality of Life    Rheumatology  Oncology  Dermatology  1. What impact has your specialty medication had on the symptoms of your skin condition (i.e. itchiness, soreness, stinging)?: Tremendous  2. What impact has your specialty medication had on your comfort level with your skin?: Tremendous  Cystic Fibrosis          How many days over the past month did your SA  keep you from your normal activities? For example, brushing your teeth or getting up in the morning. Patient declined to answer    Have you discussed this with your provider? Not needed    Acute Infection Status:    Acute infections noted within Epic:  No active infections  Patient reported infection: None    Therapy Appropriateness:    Is therapy appropriate and patient progressing towards therapeutic goals? Yes, therapy is appropriate and should be continued    DISEASE/MEDICATION-SPECIFIC INFORMATION      For patients on injectable medications: Patient currently has 0 doses left.  Next injection is scheduled for 07/04/2022.    Chronic Inflammatory Diseases: Have you experienced any flares in the last month? No    PATIENT SPECIFIC NEEDS     Does the patient have any physical, cognitive, or cultural barriers? No    Is the patient high risk? No    Did the patient require a clinical intervention? No    Does the patient require physician intervention or other additional services (i.e., nutrition, smoking cessation, social work)? No    SOCIAL DETERMINANTS OF HEALTH     At the [facility], we have learned that life circumstances - like trouble affording food, housing, utilities, or transportation can affect the health of many of our patients.   That is why we wanted to ask: are you currently experiencing any life circumstances that are negatively impacting your health and/or quality of life? Patient declined to answer    Social  Determinants of Health     Financial Resource Strain: Not on file   Internet Connectivity: Not on file   Food Insecurity: Not on file   Tobacco Use: Medium Risk (10/13/2020)    Patient History     Smoking Tobacco Use: Former     Smokeless Tobacco Use: Never     Passive Exposure: Not on file   Housing/Utilities: Not on file   Alcohol Use: Not on file   Transportation Needs: Not on file   Substance Use: Not on file   Health Literacy: Not on file   Physical Activity: Not on file   Interpersonal Safety: Not on file   Stress: Not on file   Intimate Partner Violence: Not on file   Depression: Not on file   Social Connections: Not on file       Would you be willing to receive help with any of the needs that you have identified today? Not applicable       SHIPPING     Specialty Medication(s) to be Shipped:   Inflammatory Disorders: Humira    Other medication(s) to be shipped: No additional medications requested for fill at this time     Changes to insurance: No    Delivery Scheduled: Yes, Expected medication delivery date: 07/03/2022.     Medication will be delivered via UPS to the confirmed prescription address in [facility].    The patient will receive a drug information handout for each medication shipped and additional FDA Medication Guides as required.  Verified that patient has previously received a Conservation officer, historic buildings and a Surveyor, mining.    The patient or caregiver noted above participated in the development of this care plan and knows that they can request review of or adjustments to the care plan at any time.      All of the patient's questions and concerns have been addressed.    Elnora Morrison, PharmD   [facility] Specialty Pharmacist

## 2022-07-02 MED FILL — EMPTY CONTAINER: 120 days supply | Qty: 1 | Fill #0

## 2022-07-02 MED FILL — HUMIRA PEN CITRATE FREE 40 MG/0.4 ML: SUBCUTANEOUS | 28 days supply | Qty: 4 | Fill #2

## 2022-07-04 ENCOUNTER — Telehealth (HOSPITAL_COMMUNITY): Payer: Self-pay

## 2022-07-04 NOTE — Telephone Encounter (Signed)
Pt is unable to participate at this time in pulmonary rehab. Closed referral

## 2022-07-08 DIAGNOSIS — J449 Chronic obstructive pulmonary disease, unspecified: Secondary | ICD-10-CM | POA: Diagnosis not present

## 2022-07-10 ENCOUNTER — Ambulatory Visit (INDEPENDENT_AMBULATORY_CARE_PROVIDER_SITE_OTHER): Payer: 59 | Admitting: Internal Medicine

## 2022-07-10 ENCOUNTER — Encounter: Payer: Self-pay | Admitting: Internal Medicine

## 2022-07-10 VITALS — BP 130/80 | HR 66 | Temp 98.2°F | Ht 67.0 in | Wt 270.0 lb

## 2022-07-10 DIAGNOSIS — R635 Abnormal weight gain: Secondary | ICD-10-CM | POA: Diagnosis not present

## 2022-07-10 DIAGNOSIS — M545 Low back pain, unspecified: Secondary | ICD-10-CM

## 2022-07-10 DIAGNOSIS — R269 Unspecified abnormalities of gait and mobility: Secondary | ICD-10-CM

## 2022-07-10 DIAGNOSIS — J9611 Chronic respiratory failure with hypoxia: Secondary | ICD-10-CM

## 2022-07-10 DIAGNOSIS — D869 Sarcoidosis, unspecified: Secondary | ICD-10-CM | POA: Diagnosis not present

## 2022-07-10 MED ORDER — MORPHINE SULFATE 30 MG PO TABS: 30.0000 mg | ORAL_TABLET | Freq: Two times a day (BID) | ORAL | 0 refills | Status: DC | PRN

## 2022-07-10 NOTE — Assessment & Plan Note (Signed)
On O2 2 l/min [place] 

## 2022-07-10 NOTE — Assessment & Plan Note (Signed)
Wt Readings from Last 3 Encounters:  07/10/22 270 lb (122.5 kg)  06/07/22 268 lb (121.6 kg)  06/04/22 266 lb 6.4 oz (120.8 kg)

## 2022-07-10 NOTE — Assessment & Plan Note (Signed)
Pt is at home - using a walker PCS form was filled out - needs 80 h/month

## 2022-07-10 NOTE — Progress Notes (Signed)
Subjective:  Patient ID: [name], female    DOB: [birth year]  Age: 62 y.o. MRN: [id number]  CC: Follow-up (3 MNTH F/U)   HPI [name] presents for chronic pain, falls, sarcoidosis LBP is not better w/low dose MSIR  Outpatient Medications Prior to Visit  Medication Sig Dispense Refill   acetaminophen (TYLENOL) 325 MG tablet Take 1-2 tablets (325-650 mg total) by mouth every 6 (six) hours as needed for mild pain (pain score 1-3 or temp > 100.5). 30 tablet 1   albuterol (PROVENTIL) (2.5 MG/3ML) 0.083% nebulizer solution Take 3 mLs (2.5 mg total) by nebulization every 6 (six) hours as needed for wheezing or shortness of breath. 75 mL 5   albuterol (VENTOLIN HFA) 108 (90 Base) MCG/ACT inhaler INHALE TWO puffs into THE lungs EVERY SIX HOURS AS NEEDED wheezind AND For SHORTNESS OF BREATH 6.7 g 3   B-D ULTRA-FINE 33 LANCETS MISC Use as instructed to test sugars once  daily 100 each 12   budesonide-formoterol (SYMBICORT) 160-4.5 MCG/ACT inhaler INHALE TWO puffs into THE lungs TWICE DAILY 10.2 g 5   carvedilol (COREG) 12.5 MG tablet Take 1 tablet (12.5 mg total) by mouth 2 (two) times daily with a meal. 180 tablet 1   cefdinir (OMNICEF) 300 MG capsule Take 1 capsule (300 mg total) by mouth 2 (two) times daily. 20 capsule 0   Cholecalciferol (VITAMIN D-3) 125 MCG (5000 UT) TABS Take 5,000 Units by mouth daily.     Cyanocobalamin (VITAMIN B-12) 1000 MCG SUBL Place 1 tablet (1,000 mcg total) under the tongue daily. 100 tablet 3   dexlansoprazole (DEXILANT) 60 MG capsule TAKE ONE CAPSULE BY MOUTH EVERY DAY 90 capsule 1   erythromycin ophthalmic ointment as directed.     gabapentin (NEURONTIN) 300 MG capsule TAKE ONE CAPSULE BY MOUTH EVERY MORNING **May take a second capsule during THE DAY AS NEEDED FOR PAIN 180 capsule 0   glucose blood (ACCU-CHEK GUIDE) test strip Use to check blood sugar once a day. 100 each 12   HUMIRA PEN 40 MG/0.4ML PNKT Inject 40 mg as directed  every Wednesday.      hydrOXYzine (ATARAX) 25 MG tablet Take 1 tablet (25 mg total) by mouth every 8 (eight) hours as needed. 60 tablet 3   metFORMIN (GLUCOPHAGE-XR) 500 MG 24 hr tablet Take 2 tablets (1,000 mg total) by mouth at bedtime. 180 tablet 1   moxifloxacin (VIGAMOX) 0.5 % ophthalmic solution Apply to eye as directed.     Multiple Vitamins-Minerals (MULTIVITAMIN WOMEN 50+) TABS Take 1 tablet by mouth daily.     naloxone (NARCAN) nasal spray 4 mg/0.1 mL Place 1 spray into the nose once as needed (overdose).     naltrexone (DEPADE) 50 MG tablet Take 50 mg by mouth daily.     nystatin ointment (MYCOSTATIN) Apply 1 Application topically 2 (two) times daily. 100 g 0   OXYGEN Inhale 2 L into the lungs continuous.     polyvinyl alcohol (LIQUIFILM TEARS) 1.4 % ophthalmic solution Place 1 drop into both eyes as needed for dry eyes. 15 mL 0   potassium chloride (KLOR-CON M) 10 MEQ tablet TAKE ONE TABLET BY MOUTH EVERY DAY 90 tablet 1   QUEtiapine (SEROQUEL) 50 MG tablet Take 1 tab in am and 2 tab at bedtime 90 tablet 5   rivaroxaban (XARELTO) 20 MG TABS tablet TAKE ONE TABLET BY MOUTH EVERY DAY 90 tablet 3   tobramycin-dexamethasone (TOBRADEX) ophthalmic solution as directed.  topiramate (TOPAMAX) 50 MG tablet TAKE ONE TABLET BY MOUTH TWICE DAILY 180 tablet 1   venlafaxine XR (EFFEXOR-XR) 75 MG 24 hr capsule Take 1 capsule (75 mg total) by mouth daily with breakfast. 30 capsule 11   morphine (MSIR) 15 MG tablet Take 1 tablet (15 mg total) by mouth 2 (two) times daily as needed for severe pain. 60 tablet 0   No facility-administered medications prior to visit.    ROS: Review of Systems  Constitutional:  Positive for fatigue. Negative for activity change, appetite change, chills and unexpected weight change.  HENT:  Negative for congestion, mouth sores and sinus pressure.   Eyes:  Negative for visual disturbance.  Respiratory:  Positive for shortness of breath. Negative for cough and chest  tightness.   Cardiovascular:  Negative for chest pain.  Gastrointestinal:  Negative for abdominal pain and nausea.  Genitourinary:  Negative for difficulty urinating, frequency and vaginal pain.  Musculoskeletal:  Positive for arthralgias, back pain and gait problem.  Skin:  Negative for pallor and rash.  Neurological:  Negative for dizziness, tremors, weakness, numbness and headaches.  Hematological:  Bruises/bleeds easily.  Psychiatric/Behavioral:  Positive for dysphoric mood. Negative for confusion, sleep disturbance and suicidal ideas. The patient is nervous/anxious.     Objective:  BP 130/80 (BP Location: Left Arm, Patient Position: Sitting, Cuff Size: Large)   Pulse 66   Temp 98.2 F (36.8 C) (Oral)   Ht 5\' 7"  (1.702 m)   Wt 270 lb (122.5 kg)   LMP 05/19/2003   SpO2 92%   BMI 42.29 kg/m   BP Readings from Last 3 Encounters:  07/10/22 130/80  06/07/22 (!) 140/98  06/04/22 110/60    Wt Readings from Last 3 Encounters:  07/10/22 270 lb (122.5 kg)  06/07/22 268 lb (121.6 kg)  06/04/22 266 lb 6.4 oz (120.8 kg)    Physical Exam Constitutional:      General: She is not in acute distress.    Appearance: She is well-developed.  HENT:     Head: Normocephalic.     Right Ear: External ear normal.     Left Ear: External ear normal.     Nose: Nose normal.  Eyes:     General:        Right eye: No discharge.        Left eye: No discharge.     Conjunctiva/sclera: Conjunctivae normal.     Pupils: Pupils are equal, round, and reactive to light.  Neck:     Thyroid: No thyromegaly.     Vascular: No JVD.     Trachea: No tracheal deviation.  Cardiovascular:     Rate and Rhythm: Normal rate and regular rhythm.     Heart sounds: Normal heart sounds.  Pulmonary:     Effort: No respiratory distress.     Breath sounds: No stridor. No wheezing.  Abdominal:     General: Bowel sounds are normal. There is no distension.     Palpations: Abdomen is soft. There is no mass.      Tenderness: There is no abdominal tenderness. There is no guarding or rebound.  Musculoskeletal:        General: Tenderness present.     Cervical back: Normal range of motion and neck supple. No rigidity.  Lymphadenopathy:     Cervical: No cervical adenopathy.  Skin:    Findings: No erythema or rash.  Neurological:     Mental Status: She is oriented to person, place, and time.  Cranial Nerves: No cranial nerve deficit.     Motor: No abnormal muscle tone.     Coordination: Coordination abnormal.     Gait: Gait abnormal.     Deep Tendon Reflexes: Reflexes normal.  Psychiatric:        Behavior: Behavior normal.        Thought Content: Thought content normal.        Judgment: Judgment normal.   LS w/pain Using a walker O2 is on  Lab Results  Component Value Date   WBC 7.1 12/10/2021   HGB 12.7 12/10/2021   HCT 38.3 12/10/2021   PLT 214.0 12/10/2021   GLUCOSE 104 (H) 12/10/2021   CHOL 210 (H) 07/19/2020   TRIG 109.0 07/19/2020   HDL 77.20 07/19/2020   LDLDIRECT 107.0 06/30/2017   LDLCALC 111 (H) 07/19/2020   ALT 13 12/10/2021   AST 19 12/10/2021   NA 137 12/10/2021   K 3.9 12/10/2021   CL 103 12/10/2021   CREATININE 0.94 12/10/2021   BUN 13 12/10/2021   CO2 28 12/10/2021   TSH 1.18 07/02/2021   INR 1.00 02/11/2018   HGBA1C 6.0 04/09/2022   MICROALBUR <0.7 04/09/2022    CT Chest High Resolution  Result Date: 06/10/2022 CLINICAL DATA:  62 year old female with history of worsening sarcoidosis. EXAM: CT CHEST WITHOUT CONTRAST TECHNIQUE: Multidetector CT imaging of the chest was performed following the standard protocol without intravenous contrast. High resolution imaging of the lungs, as well as inspiratory and expiratory imaging, was performed. RADIATION DOSE REDUCTION: This exam was performed according to the departmental dose-optimization program which includes automated exposure control, adjustment of the mA and/or kV according to patient size and/or use of  iterative reconstruction technique. COMPARISON:  Chest CT 07/21/2018. FINDINGS: Cardiovascular: Heart size is mildly enlarged. There is no significant pericardial fluid, thickening or pericardial calcification. There is aortic atherosclerosis, as well as atherosclerosis of the great vessels of the mediastinum and the coronary arteries, including calcified atherosclerotic plaque in the left anterior descending, left circumflex and right coronary arteries. Mediastinum/Nodes: Multiple prominent borderline enlarged and mildly enlarged mediastinal and bilateral hilar lymph nodes, many of which are densely calcified, similar to the prior study. Esophagus is unremarkable in appearance. No axillary lymphadenopathy. Lungs/Pleura: Again noted are widespread areas of thickening of the peribronchovascular interstitium and some scattered areas of micro nodularity, most evident in peribronchovascular and subpleural distributions throughout the mid to upper lungs, generally similar to the prior study, compatible with reported clinical history of sarcoidosis. The exception to this stability is a new mass-like area of architectural distortion in the superior segment of the left lower lobe abutting the major fissure (axial image 57 of series 7 and coronal image 98 of series 5) which is estimated to measure approximately 4.9 x 2.2 x 2.1 cm. The periphery of this lesion has a branching irregular appearance, suggesting probable areas of mucoid impaction. Bronchi leading into this region appears severely narrowed. On soft tissue windows this lesion is heterogeneous in attenuation, but generally rather low attenuation, which could suggest substantial mucoid impaction. Inspiratory and expiratory imaging demonstrates some air trapping indicative of small airways disease. Upper Abdomen: Aortic atherosclerosis.  Status post cholecystectomy. Musculoskeletal: There are no aggressive appearing lytic or blastic lesions noted in the visualized  portions of the skeleton. IMPRESSION: 1. Overall, today's examination demonstrates little interval change compared to prior studies,, with chronic imaging stigmata compatible with reported clinical history of sarcoidosis. The exception to this is a new mass-like opacity in the superior  segment of the left lower lobe, which is favored to represent an area of postobstructive mucoid impaction (i.e., a large bronchocele). While the possibility of a developing neoplasm is not excluded, it is not strongly favored. Further clinical evaluation is recommended, with consideration for follow-up bronchoscopy if clinically appropriate. 2. Cardiomegaly. 3. Aortic atherosclerosis, in addition to three-vessel coronary artery disease. Please note that although the presence of coronary artery calcium documents the presence of coronary artery disease, the severity of this disease and any potential stenosis cannot be assessed on this non-gated CT examination. Assessment for potential risk factor modification, dietary therapy or pharmacologic therapy may be warranted, if clinically indicated. Aortic Atherosclerosis (ICD10-I70.0). Electronically Signed   By: Trudie Reed M.D.   On: 06/10/2022 13:04    Assessment & Plan:   Problem List Items Addressed This Visit     Sarcoidosis    Off Steroids now On O2      Weight gain - Primary    Wt Readings from Last 3 Encounters:  07/10/22 270 lb (122.5 kg)  06/07/22 268 lb (121.6 kg)  06/04/22 266 lb 6.4 oz (120.8 kg)        Chronic respiratory failure with hypoxia (HCC)    On O2 2 l/min [place]      Low back pain    LBP is not better w/low dose MSIR Will increase to 30 mg po bid  Potential benefits of a long term opioids use as well as potential risks (i.e. addiction risk, apnea etc) and complications (i.e. Somnolence, constipation and others) were explained to the patient and were aknowledged.       Relevant Medications   morphine (MSIR) 30 MG tablet   Gait disorder     Pt is at home - using a walker PCS form was filled out - needs 80 h/month         Meds ordered this encounter  Medications   morphine (MSIR) 30 MG tablet    Sig: Take 1 tablet (30 mg total) by mouth 2 (two) times daily as needed for severe pain.    Dispense:  60 tablet    Refill:  0      Follow-up: Return in about 2 months (around 09/09/2022) for a follow-up visit.  Sonda Primes, MD

## 2022-07-10 NOTE — Assessment & Plan Note (Signed)
Off Steroids now On O2

## 2022-07-10 NOTE — Assessment & Plan Note (Signed)
LBP is not better w/low dose MSIR Will increase to 30 mg po bid  Potential benefits of a long term opioids use as well as potential risks (i.e. addiction risk, apnea etc) and complications (i.e. Somnolence, constipation and others) were explained to the patient and were aknowledged.

## 2022-07-11 ENCOUNTER — Telehealth: Payer: Self-pay | Admitting: *Deleted

## 2022-07-11 ENCOUNTER — Other Ambulatory Visit: Payer: Self-pay | Admitting: Internal Medicine

## 2022-07-11 NOTE — Telephone Encounter (Signed)
Called pt inform the FMLA form was faxed. Did son want too pick up original. She states she will pick-up in August when she come for her appt. Place forms upfront for pick-up.Marland KitchenRaechel Chute

## 2022-07-22 NOTE — Unmapped (Signed)
[facility] Refill Coordination Note    Specialty Medication(s) to be Shipped:   Inflammatory Disorders: Humira    Other medication(s) to be shipped: No additional medications requested for fill at this time     Carolyn Newton, DOB: June 15, 1960  Phone: [phone] (home)       All above HIPAA information was verified with patient.     Was a Nurse, learning disability used for this call? No    Completed refill call assessment today to schedule patient's medication shipment from the [facility] [phone]).  All relevant notes have been reviewed.     Specialty medication(s) and dose(s) confirmed: Regimen is correct and unchanged.   Changes to medications: [place] reports no changes at this time.  Changes to insurance: No  New side effects reported not previously addressed with a pharmacist or physician: None reported  Questions for the pharmacist: No    Confirmed patient received a Conservation officer, historic buildings and a Surveyor, mining with first shipment. The patient will receive a drug information handout for each medication shipped and additional FDA Medication Guides as required.       DISEASE/MEDICATION-SPECIFIC INFORMATION        For patients on injectable medications: Patient currently has 2 doses left.  Next injection is scheduled for 07/24/2022.    SPECIALTY MEDICATION ADHERENCE     Medication Adherence    Patient reported X missed doses in the last month: 0  Specialty Medication: HUMIRA PEN CITRATE FREE 40 MG/0.4 ML  Patient is on additional specialty medications: No  Informant: patient                Were doses missed due to medication being on hold? No    HUMIRA(CF) PEN 40 /0.4   mg/ml: 10 days of medicine on hand       REFERRAL TO PHARMACIST     Referral to the pharmacist: Not needed      [facility]     Shipping address confirmed in Epic.       Delivery Scheduled: Yes, Expected medication delivery date: 07/31/2022.     Medication will be delivered via UPS to the prescription address in Epic WAM.    [name]   [facility] Specialty Technician

## 2022-07-23 ENCOUNTER — Other Ambulatory Visit: Payer: Self-pay | Admitting: Internal Medicine

## 2022-07-25 ENCOUNTER — Ambulatory Visit (HOSPITAL_COMMUNITY): Payer: 59

## 2022-07-26 ENCOUNTER — Other Ambulatory Visit (HOSPITAL_COMMUNITY): Payer: 59

## 2022-07-29 ENCOUNTER — Other Ambulatory Visit: Payer: Self-pay | Admitting: Internal Medicine

## 2022-07-30 MED FILL — HUMIRA PEN CITRATE FREE 40 MG/0.4 ML: SUBCUTANEOUS | 28 days supply | Qty: 4 | Fill #3

## 2022-08-02 ENCOUNTER — Other Ambulatory Visit: Payer: Self-pay | Admitting: Internal Medicine

## 2022-08-07 DIAGNOSIS — J449 Chronic obstructive pulmonary disease, unspecified: Secondary | ICD-10-CM | POA: Diagnosis not present

## 2022-08-12 ENCOUNTER — Ambulatory Visit (HOSPITAL_COMMUNITY): Payer: 59

## 2022-08-12 ENCOUNTER — Ambulatory Visit: Payer: 59 | Admitting: Podiatry

## 2022-08-19 ENCOUNTER — Encounter (HOSPITAL_COMMUNITY): Payer: Self-pay

## 2022-08-19 ENCOUNTER — Ambulatory Visit (HOSPITAL_COMMUNITY)
Admission: RE | Admit: 2022-08-19 | Discharge: 2022-08-19 | Disposition: A | Discharge status: Home / Self Care | Payer: 59 | Source: Ambulatory Visit | Attending: Adult Health | Admitting: Adult Health

## 2022-08-19 DIAGNOSIS — T17500A Unspecified foreign body in bronchus causing asphyxiation, initial encounter: Secondary | ICD-10-CM | POA: Insufficient documentation

## 2022-08-19 DIAGNOSIS — R590 Localized enlarged lymph nodes: Secondary | ICD-10-CM | POA: Diagnosis not present

## 2022-08-19 DIAGNOSIS — R918 Other nonspecific abnormal finding of lung field: Secondary | ICD-10-CM | POA: Diagnosis not present

## 2022-08-19 DIAGNOSIS — J984 Other disorders of lung: Secondary | ICD-10-CM | POA: Diagnosis not present

## 2022-08-19 DIAGNOSIS — D860 Sarcoidosis of lung: Secondary | ICD-10-CM | POA: Diagnosis not present

## 2022-08-20 ENCOUNTER — Other Ambulatory Visit: Payer: Self-pay | Admitting: Adult Health

## 2022-08-20 ENCOUNTER — Other Ambulatory Visit: Payer: Self-pay | Admitting: Internal Medicine

## 2022-08-22 NOTE — Unmapped (Signed)
[facility] Refill Coordination Note    Specialty Medication(s) to be Shipped:   Inflammatory Disorders: Humira    Other medication(s) to be shipped: No additional medications requested for fill at this time     Carolyn Newton, DOB: [birth year]  Phone: [phone] (home)       All above HIPAA information was verified with patient.     Was a Nurse, learning disability used for this call? No    Completed refill call assessment today to schedule patient's medication shipment from the [facility] [phone]).  All relevant notes have been reviewed.     Specialty medication(s) and dose(s) confirmed: Regimen is correct and unchanged.   Changes to medications: [place] reports no changes at this time.  Changes to insurance: No  New side effects reported not previously addressed with a pharmacist or physician: None reported  Questions for the pharmacist: No    Confirmed patient received a Conservation officer, historic buildings and a Surveyor, mining with first shipment. The patient will receive a drug information handout for each medication shipped and additional FDA Medication Guides as required.       DISEASE/MEDICATION-SPECIFIC INFORMATION        For patients on injectable medications: Patient currently has 1 doses left.  Next injection is scheduled for 08/28/2022.    SPECIALTY MEDICATION ADHERENCE     Medication Adherence    Patient reported X missed doses in the last month: 0  Specialty Medication: HUMIRA PEN CITRATE FREE 40 MG/0.4 ML  Patient is on additional specialty medications: No  Informant: patient                Were doses missed due to medication being on hold? No    HUMIRA(CF) PEN 40 /0.4   mg/ml: 6 days of medicine on hand       REFERRAL TO PHARMACIST     Referral to the pharmacist: Not needed      [facility]     Shipping address confirmed in Epic.       Delivery Scheduled: Yes, Expected medication delivery date: 08/29/2022. *added note to delivery for sig required per patient request    Medication will be delivered via UPS to the prescription address in Epic WAM.    [name]   [facility] Specialty Technician

## 2022-08-23 ENCOUNTER — Encounter (HOSPITAL_BASED_OUTPATIENT_CLINIC_OR_DEPARTMENT_OTHER): Payer: Self-pay

## 2022-08-26 ENCOUNTER — Ambulatory Visit (INDEPENDENT_AMBULATORY_CARE_PROVIDER_SITE_OTHER): Payer: 59

## 2022-08-26 ENCOUNTER — Encounter: Payer: Self-pay | Admitting: Internal Medicine

## 2022-08-26 ENCOUNTER — Ambulatory Visit (INDEPENDENT_AMBULATORY_CARE_PROVIDER_SITE_OTHER): Payer: 59 | Admitting: Internal Medicine

## 2022-08-26 VITALS — BP 130/80 | HR 69 | Temp 98.0°F | Ht 67.0 in | Wt 277.0 lb

## 2022-08-26 DIAGNOSIS — S60211A Contusion of right wrist, initial encounter: Secondary | ICD-10-CM

## 2022-08-26 DIAGNOSIS — S52334A Nondisplaced oblique fracture of shaft of right radius, initial encounter for closed fracture: Secondary | ICD-10-CM | POA: Diagnosis not present

## 2022-08-26 DIAGNOSIS — W1800XA Striking against unspecified object with subsequent fall, initial encounter: Secondary | ICD-10-CM | POA: Diagnosis not present

## 2022-08-26 DIAGNOSIS — E538 Deficiency of other specified B group vitamins: Secondary | ICD-10-CM

## 2022-08-26 DIAGNOSIS — G894 Chronic pain syndrome: Secondary | ICD-10-CM | POA: Diagnosis not present

## 2022-08-26 DIAGNOSIS — R0781 Pleurodynia: Secondary | ICD-10-CM

## 2022-08-26 DIAGNOSIS — F419 Anxiety disorder, unspecified: Secondary | ICD-10-CM

## 2022-08-26 DIAGNOSIS — S52514A Nondisplaced fracture of right radial styloid process, initial encounter for closed fracture: Secondary | ICD-10-CM | POA: Diagnosis not present

## 2022-08-26 DIAGNOSIS — S2241XA Multiple fractures of ribs, right side, initial encounter for closed fracture: Secondary | ICD-10-CM | POA: Diagnosis not present

## 2022-08-26 DIAGNOSIS — R0789 Other chest pain: Secondary | ICD-10-CM | POA: Insufficient documentation

## 2022-08-26 DIAGNOSIS — M19031 Primary osteoarthritis, right wrist: Secondary | ICD-10-CM | POA: Diagnosis not present

## 2022-08-26 DIAGNOSIS — S60219A Contusion of unspecified wrist, initial encounter: Secondary | ICD-10-CM | POA: Insufficient documentation

## 2022-08-26 DIAGNOSIS — F339 Major depressive disorder, recurrent, unspecified: Secondary | ICD-10-CM

## 2022-08-26 MED ORDER — MORPHINE SULFATE 30 MG PO TABS: 30.0000 mg | ORAL_TABLET | Freq: Two times a day (BID) | ORAL | 0 refills | Status: DC | PRN

## 2022-08-26 NOTE — Assessment & Plan Note (Signed)
On B12 

## 2022-08-26 NOTE — Assessment & Plan Note (Signed)
On seroquel

## 2022-08-26 NOTE — Progress Notes (Signed)
Subjective:  Patient ID: [name], female    DOB: Jun 10, 1960  Age: 62 y.o. MRN: [id number]  CC: Follow-up (2 mnth f/u, pt had a fall on 08/23/2022 injuring rt hand/wrist and rt rib cage. Pt has swelling in her rt hand/wrist and pain in her rt rib cage area.)   HPI [name] presents for a chronic pain f/u visit  Pt had a fall outside of her home on 08/23/2022 injuring rt hand/wrist and rt rib cage. Pt has swelling in her rt hand/wrist and pain in her rt rib cage area. Unable to use her R hand much due to R hand and wrist pain...  Outpatient Medications Prior to Visit  Medication Sig Dispense Refill  . acetaminophen (TYLENOL) 325 MG tablet Take 1-2 tablets (325-650 mg total) by mouth every 6 (six) hours as needed for mild pain (pain score 1-3 or temp > 100.5). 30 tablet 1  . albuterol (PROVENTIL) (2.5 MG/3ML) 0.083% nebulizer solution Take 3 mLs (2.5 mg total) by nebulization every 6 (six) hours as needed for wheezing or shortness of breath. 75 mL 5  . albuterol (VENTOLIN HFA) 108 (90 Base) MCG/ACT inhaler INHALE TWO puffs into THE lungs EVERY SIX HOURS AS NEEDED wheezind AND For SHORTNESS OF BREATH 6.7 g 3  . B-D ULTRA-FINE 33 LANCETS MISC Use as instructed to test sugars once  daily 100 each 12  . budesonide-formoterol (SYMBICORT) 160-4.5 MCG/ACT inhaler INHALE TWO puffs into THE lungs TWICE DAILY 10.2 g 5  . carvedilol (COREG) 12.5 MG tablet Take 1 tablet (12.5 mg total) by mouth 2 (two) times daily with a meal. 180 tablet 1  . cefdinir (OMNICEF) 300 MG capsule Take 1 capsule (300 mg total) by mouth 2 (two) times daily. 20 capsule 0  . Cholecalciferol (VITAMIN D-3) 125 MCG (5000 UT) TABS Take 5,000 Units by mouth daily.    . Cyanocobalamin (VITAMIN B-12) 1000 MCG SUBL Place 1 tablet (1,000 mcg total) under the tongue daily. 100 tablet 3  . dexlansoprazole (DEXILANT) 60 MG capsule TAKE ONE CAPSULE BY MOUTH EVERY DAY 90 capsule 1  . erythromycin ophthalmic  ointment as directed.    . gabapentin (NEURONTIN) 300 MG capsule TAKE ONE CAPSULE BY MOUTH EVERY MORNING **May take a second capsule during THE DAY AS NEEDED FOR PAIN 180 capsule 0  . glucose blood (ACCU-CHEK GUIDE) test strip Use to check blood sugar once a day. 100 each 12  . HUMIRA PEN 40 MG/0.4ML PNKT Inject 40 mg as directed every Wednesday.     . hydrOXYzine (ATARAX) 25 MG tablet TAKE ONE TABLET BY MOUTH EVERY EIGHT HOURS AS NEEDED 60 tablet 3  . metFORMIN (GLUCOPHAGE-XR) 500 MG 24 hr tablet Take 2 tablets (1,000 mg total) by mouth at bedtime. 180 tablet 1  . moxifloxacin (VIGAMOX) 0.5 % ophthalmic solution Apply to eye as directed.    . Multiple Vitamins-Minerals (MULTIVITAMIN WOMEN 50+) TABS Take 1 tablet by mouth daily.    . naloxone (NARCAN) nasal spray 4 mg/0.1 mL Place 1 spray into the nose once as needed (overdose).    . naltrexone (DEPADE) 50 MG tablet Take 50 mg by mouth daily.    [name] nystatin ointment (MYCOSTATIN) Apply 1 Application topically 2 (two) times daily. 100 g 0  . OXYGEN Inhale 2 L into the lungs continuous.    . polyvinyl alcohol (LIQUIFILM TEARS) 1.4 % ophthalmic solution Place 1 drop into both eyes as needed for dry eyes. 15 mL 0  . potassium  chloride (KLOR-CON M) 10 MEQ tablet TAKE ONE TABLET BY MOUTH EVERY DAY 90 tablet 1  . QUEtiapine (SEROQUEL) 50 MG tablet Take 1 tab in am and 2 tab at bedtime 90 tablet 5  . rivaroxaban (XARELTO) 20 MG TABS tablet TAKE ONE TABLET BY MOUTH EVERY DAY 90 tablet 3  . tobramycin-dexamethasone (TOBRADEX) ophthalmic solution as directed.    . topiramate (TOPAMAX) 50 MG tablet TAKE ONE TABLET BY MOUTH TWICE DAILY 180 tablet 1  . venlafaxine XR (EFFEXOR-XR) 75 MG 24 hr capsule TAKE ONE CAPSULE BY MOUTH EVERY DAY WITH breakfast 30 capsule 11  . morphine (MSIR) 30 MG tablet Take 1 tablet (30 mg total) by mouth 2 (two) times daily as needed for severe pain. 60 tablet 0   No facility-administered medications prior to visit.    ROS: Review  of Systems  Constitutional:  Negative for activity change, appetite change, chills, fatigue and unexpected weight change.  HENT:  Negative for congestion, mouth sores and sinus pressure.   Eyes:  Negative for visual disturbance.  Respiratory:  Negative for cough and chest tightness.   Cardiovascular:  Positive for chest pain. Negative for palpitations.  Gastrointestinal:  Negative for abdominal pain and nausea.  Genitourinary:  Negative for difficulty urinating, frequency and vaginal pain.  Musculoskeletal:  Positive for arthralgias and back pain. Negative for gait problem.  Skin:  Negative for pallor and rash.  Neurological:  Negative for dizziness, tremors, weakness, numbness and headaches.  Psychiatric/Behavioral:  Negative for confusion, sleep disturbance and suicidal ideas. The patient is nervous/anxious.     Objective:  BP 130/80 (BP Location: Left Arm, Patient Position: Sitting, Cuff Size: Large)   Pulse 69   Temp 98 F (36.7 C) (Oral)   Ht 5\' 7"  (1.702 m)   Wt 277 lb (125.6 kg)   LMP 05/19/2003   SpO2 93%   BMI 43.38 kg/m   BP Readings from Last 3 Encounters:  08/26/22 130/80  07/10/22 130/80  06/07/22 (!) 140/98    Wt Readings from Last 3 Encounters:  08/26/22 277 lb (125.6 kg)  07/10/22 270 lb (122.5 kg)  06/07/22 268 lb (121.6 kg)    Physical Exam Constitutional:      General: She is not in acute distress.    Appearance: She is well-developed. She is obese.  HENT:     Head: Normocephalic.     Right Ear: External ear normal.     Left Ear: External ear normal.     Nose: Nose normal.  Eyes:     General:        Right eye: No discharge.        Left eye: No discharge.     Conjunctiva/sclera: Conjunctivae normal.     Pupils: Pupils are equal, round, and reactive to light.  Neck:     Thyroid: No thyromegaly.     Vascular: No JVD.     Trachea: No tracheal deviation.  Cardiovascular:     Rate and Rhythm: Normal rate and regular rhythm.     Heart sounds:  Normal heart sounds.  Pulmonary:     Effort: No respiratory distress.     Breath sounds: No stridor. No wheezing.  Abdominal:     General: Bowel sounds are normal. There is no distension.     Palpations: Abdomen is soft. There is no mass.     Tenderness: There is no abdominal tenderness. There is no guarding or rebound.  Musculoskeletal:        General:  Tenderness present.     Cervical back: Normal range of motion and neck supple. No rigidity.     Right lower leg: No edema.     Left lower leg: No edema.  Lymphadenopathy:     Cervical: No cervical adenopathy.  Skin:    Findings: Rash present. No erythema.  Neurological:     Mental Status: She is oriented to person, place, and time.     Cranial Nerves: No cranial nerve deficit.     Motor: Weakness present. No abnormal muscle tone.     Coordination: Coordination abnormal.     Gait: Gait abnormal.     Deep Tendon Reflexes: Reflexes normal.  Psychiatric:        Behavior: Behavior normal.        Thought Content: Thought content normal.        Judgment: Judgment normal.  R wrist/thumb - painful R lat ribs w/pain R shoulder w/a bruise Using a walker O2 is on  Lab Results  Component Value Date   WBC 7.1 12/10/2021   HGB 12.7 12/10/2021   HCT 38.3 12/10/2021   PLT 214.0 12/10/2021   GLUCOSE 104 (H) 12/10/2021   CHOL 210 (H) 07/19/2020   TRIG 109.0 07/19/2020   HDL 77.20 07/19/2020   LDLDIRECT 107.0 06/30/2017   LDLCALC 111 (H) 07/19/2020   ALT 13 12/10/2021   AST 19 12/10/2021   NA 137 12/10/2021   K 3.9 12/10/2021   CL 103 12/10/2021   CREATININE 0.94 12/10/2021   BUN 13 12/10/2021   CO2 28 12/10/2021   TSH 1.18 07/02/2021   INR 1.00 02/11/2018   HGBA1C 6.0 04/09/2022   MICROALBUR <0.7 04/09/2022    CT Chest Wo Contrast  Result Date: 08/23/2022 CLINICAL DATA:  Follow-up left lower lobe process. EXAM: CT CHEST WITHOUT CONTRAST TECHNIQUE: Multidetector CT imaging of the chest was performed following the standard  protocol without IV contrast. RADIATION DOSE REDUCTION: This exam was performed according to the departmental dose-optimization program which includes automated exposure control, adjustment of the mA and/or kV according to patient size and/or use of iterative reconstruction technique. COMPARISON:  Chest CT 06/06/2022 FINDINGS: Cardiovascular: The heart is normal in size. No pericardial effusion. Stable age advanced atherosclerotic calcifications involving the aorta and coronary arteries. Mediastinum/Nodes: Stable extensive partially calcified mediastinal and hilar lymphadenopathy consistent with known sarcoidosis. The esophagus is grossly normal. 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 The superior segment left lower lobe process has improved and is likely resolving atelectasis. This area measures 3.1 x 1.2 cm and previously measured 5.0 x 2.2 cm. No worrisome pulmonary lesions or nodules.  No pleural effusions. Upper Abdomen: No significant upper abdominal findings. Stable aortic calcifications. Musculoskeletal: No significant bony findings. IMPRESSION: 1. Stable extensive partially calcified mediastinal and hilar lymphadenopathy consistent with known sarcoidosis. 2. 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 3. The superior segment left lower lobe process has improved and is likely resolving atelectasis. Recommend follow-up noncontrast chest CT in 3-6 months 4. No worrisome pulmonary lesions or nodules. Aortic Atherosclerosis (ICD10-I70.0). Electronically Signed   By: Rudie Meyer M.D.   On: 08/23/2022 08:56    Assessment & Plan:   Problem List Items Addressed This Visit     B12 deficiency    On B12      Anxiety disorder    Severe anxiety and insomnia - worse (off Clonazepam x 2 months due to her [facility] requirement). On Seroquel. Cont on Effexor.  Potential  benefits of a long term Seroquel use as well as potential risks and complications  (i.e. somnolence, constipation and others) were explained to the patient and were aknowledged.      Major depressive disorder, recurrent episode (HCC)    On  seroquel      Chronic pain disorder    On MS 30 mg bid  Potential benefits of a long term opioids use as well as potential risks (i.e. addiction risk, apnea etc) and complications (i.e. Somnolence, constipation and others) were explained to the patient and were aknowledged. Narcane prn      Relevant Medications   morphine (MSIR) 30 MG tablet   Rib pain on right side - Primary    Pt had a fall on 08/23/2022 injuring rt hand/wrist and rt rib cage. Pt has swelling in her rt hand/wrist and pain in her rt rib cage area. Unable to use her R hand much due to R hand and wrist pain... X ray ribs      Relevant Orders   DG Ribs Unilateral Right   Wrist contusion    Pt had a fall on 08/23/2022 injuring rt hand/wrist and rt rib cage. Pt has swelling in her rt hand/wrist and pain in her rt rib cage area. Unable to use her R hand much due to R hand and wrist pain... X ray R wrist/hand      Relevant Orders   DG Hand Complete Right   DG Wrist Complete Right   Fall against object    Pt had a fall on 08/23/2022 injuring rt hand/wrist and rt rib cage. Pt has swelling in her rt hand/wrist and pain in her rt rib cage area. Unable to use her R hand much due to R hand and wrist pain... X ray R wrist/hand/ribs Wrist ACE applied      Relevant Orders   DG Hand Complete Right   DG Wrist Complete Right   DG Ribs Unilateral Right      Meds ordered this encounter  Medications  . morphine (MSIR) 30 MG tablet    Sig: Take 1 tablet (30 mg total) by mouth 2 (two) times daily as needed for severe pain.    Dispense:  60 tablet    Refill:  0      Follow-up: Return in about 4 weeks (around 09/23/2022) for a follow-up visit.  Sonda Primes, MD

## 2022-08-26 NOTE — Assessment & Plan Note (Signed)
Pt had a fall on 08/23/2022 injuring rt hand/wrist and rt rib cage. Pt has swelling in her rt hand/wrist and pain in her rt rib cage area. Unable to use her R hand much due to R hand and wrist pain... X ray R wrist/hand

## 2022-08-26 NOTE — Assessment & Plan Note (Addendum)
On MS 30 mg bid  Potential benefits of a long term opioids use as well as potential risks (i.e. addiction risk, apnea etc) and complications (i.e. Somnolence, constipation and others) were explained to the patient and were aknowledged. Narcane prn

## 2022-08-26 NOTE — Assessment & Plan Note (Signed)
Pt had a fall on 08/23/2022 injuring rt hand/wrist and rt rib cage. Pt has swelling in her rt hand/wrist and pain in her rt rib cage area. Unable to use her R hand much due to R hand and wrist pain... X ray ribs

## 2022-08-26 NOTE — Assessment & Plan Note (Addendum)
Pt had a fall on 08/23/2022 injuring rt hand/wrist and rt rib cage. Pt has swelling in her rt hand/wrist and pain in her rt rib cage area. Unable to use her R hand much due to R hand and wrist pain... X ray R wrist/hand/ribs Wrist ACE applied

## 2022-08-26 NOTE — Assessment & Plan Note (Signed)
Severe anxiety and insomnia - worse (off Clonazepam x 2 months due to her [facility] requirement). On Seroquel. Cont on Effexor.  Potential benefits of a long term Seroquel use as well as potential risks and complications (i.e. somnolence, constipation and others) were explained to the patient and were aknowledged.

## 2022-08-27 ENCOUNTER — Ambulatory Visit (INDEPENDENT_AMBULATORY_CARE_PROVIDER_SITE_OTHER): Payer: 59 | Admitting: Pulmonary Disease

## 2022-08-27 DIAGNOSIS — J441 Chronic obstructive pulmonary disease with (acute) exacerbation: Secondary | ICD-10-CM | POA: Diagnosis not present

## 2022-08-27 LAB — PULMONARY FUNCTION TEST
DL/VA % pred: 102 %
DL/VA: 4.19 ml/min/mmHg/L
DLCO cor % pred: 62 %
DLCO cor: 13.82 ml/min/mmHg
DLCO unc % pred: 62 %
DLCO unc: 13.82 ml/min/mmHg
FEF 25-75 Post: 1.26 L/sec
FEF 25-75 Pre: 0.99 L/sec
FEF2575-%Change-Post: 27 %
FEF2575-%Pred-Post: 51 %
FEF2575-%Pred-Pre: 40 %
FEV1-%Change-Post: 11 %
FEV1-%Pred-Post: 54 %
FEV1-%Pred-Pre: 49 %
FEV1-Post: 1.53 L
FEV1-Pre: 1.38 L
FEV1FVC-%Change-Post: 2 %
FEV1FVC-%Pred-Pre: 90 %
FEV6-%Change-Post: 7 %
FEV6-%Pred-Post: 60 %
FEV6-%Pred-Pre: 56 %
FEV6-Post: 2.11 L
FEV6-Pre: 1.97 L
FEV6FVC-%Change-Post: 0 %
FEV6FVC-%Pred-Post: 103 %
FEV6FVC-%Pred-Pre: 103 %
FVC-%Change-Post: 7 %
FVC-%Pred-Post: 58 %
FVC-%Pred-Pre: 54 %
FVC-Post: 2.12 L
FVC-Pre: 1.97 L
Post FEV1/FVC ratio: 72 %
Post FEV6/FVC ratio: 100 %
Pre FEV1/FVC ratio: 70 %
Pre FEV6/FVC Ratio: 100 %
RV % pred: 73 %
RV: 1.61 L
TLC % pred: 65 %
TLC: 3.6 L

## 2022-08-27 NOTE — Patient Instructions (Signed)
Full PFT performed today. °

## 2022-08-27 NOTE — Progress Notes (Signed)
Full PFT performed today. °

## 2022-08-28 ENCOUNTER — Encounter (HOSPITAL_BASED_OUTPATIENT_CLINIC_OR_DEPARTMENT_OTHER): Payer: Self-pay | Admitting: Pulmonary Disease

## 2022-08-28 ENCOUNTER — Telehealth (INDEPENDENT_AMBULATORY_CARE_PROVIDER_SITE_OTHER): Payer: 59 | Admitting: Pulmonary Disease

## 2022-08-28 ENCOUNTER — Encounter (HOSPITAL_BASED_OUTPATIENT_CLINIC_OR_DEPARTMENT_OTHER): Payer: 59

## 2022-08-28 ENCOUNTER — Encounter (HOSPITAL_BASED_OUTPATIENT_CLINIC_OR_DEPARTMENT_OTHER): Payer: Self-pay

## 2022-08-28 DIAGNOSIS — D869 Sarcoidosis, unspecified: Secondary | ICD-10-CM | POA: Diagnosis not present

## 2022-08-28 MED FILL — HUMIRA PEN CITRATE FREE 40 MG/0.4 ML: SUBCUTANEOUS | 28 days supply | Qty: 4 | Fill #4

## 2022-08-28 NOTE — Patient Instructions (Signed)
X referral to pulm rehab

## 2022-08-28 NOTE — Assessment & Plan Note (Signed)
Appears stable 

## 2022-08-28 NOTE — Progress Notes (Signed)
I connected with  [name] on 08/28/22 by video enabled telemedicine application and verified that I am speaking with the correct person using two identifiers.     Location: Patient: Home Provider: Office - [facility] [address] [place], [place] [address]   I discussed the limitations of evaluation and management by telemedicine and the availability of in person appointments. The patient expressed understanding and agreed to proceed. I also discussed with the patient that there may be a patient responsible charge related to this service. The patient expressed understanding and agreed to proceed.   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 She has chronic respiratory failure on Oxygen on 2l/m. Has OSA , CPAP intolerant.  28-month follow-up visit. She unfortunately had a fall on 8/12 and hurt her arm and right side of her chest.  She had x-ray ribs taken which have not been reviewed but to my review does not show any rib fractures. We reviewed CT and PFTs. She remains on oxygen all the time. Symbicort continues to help     Significant tests/ events reviewed   2018 started on Humira for reoccurring cutaneous lesions not responding to methotrexate.  Previously on prednisone but tapered off.   Admitted 07/21/2016 for RLE DVT-had stopped Xarelto 04/2016 for prior pulmonary embolism   CT scan = chronic appearing subsegmental PE , on Xarelto lifelong    VQ Scan 08/17/15 >> LLL & RUL acute PE     LE Venous Doppler 08/18/15 >> neg DVT , no bakers cyst, ?prominent inguinal lymph node measuring 3.1cm     NPSG 12/2004 mild, RDI 13/h   CT chest wo con 08/19/22 >> Stable extensive partially calcified mediastinal and hilar  lymphadenopathy , chr pulmonary scarring  The superior segment left lower lobe process has improved and is likely resolving atelectasis.   PFT 05/2014 >> FVC 65%, ratio 69, FEV1 58%, DLCO 62%    PFTs 07/2018 showed stable lung function with FEV1 at 70%, ratio 81, FVC 67% and DLCO was 61% >>similar to 2016  PFTs 08/2022 postbronchodilator normal ratio 72, FVC 58%, FEV1 54%, TLC 65%, DLCO 62%/13.8    On exam -able to speak in full sentences, no accessory muscle use, normal left fact  Impression/plan Sarcoidosis -appears to be stable by imaging and PFTs. 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  She has lost her dermatologist due to insurance being out of network and is looking for a new one. She will continue on Symbicort due to symptomatic benefit  Chronic respiratory failure with hypoxia She will continue on 2 L of oxygen at all times We will refer her to pulmonary rehab  Total encounter time was 23 minutes   V. Vassie Loll MD

## 2022-08-29 ENCOUNTER — Encounter (INDEPENDENT_AMBULATORY_CARE_PROVIDER_SITE_OTHER): Payer: Self-pay

## 2022-08-30 ENCOUNTER — Telehealth: Payer: Self-pay | Admitting: Internal Medicine

## 2022-08-30 NOTE — Telephone Encounter (Signed)
Called pt gave her [place] response.Marland KitchenShearon Stalls

## 2022-08-30 NOTE — Telephone Encounter (Signed)
Patient called to check x-ray results from Monday. Report was not back so I reached out to tech to request stat read. Reports are back and it looks like patient may have fx. Please have doc of the day review and call patient with recommendations.  [phone].

## 2022-09-02 ENCOUNTER — Encounter: Payer: Self-pay | Admitting: Internal Medicine

## 2022-09-03 ENCOUNTER — Telehealth: Payer: Self-pay | Admitting: Internal Medicine

## 2022-09-03 ENCOUNTER — Other Ambulatory Visit: Payer: Self-pay | Admitting: Internal Medicine

## 2022-09-03 DIAGNOSIS — S62101A Fracture of unspecified carpal bone, right wrist, initial encounter for closed fracture: Secondary | ICD-10-CM

## 2022-09-03 DIAGNOSIS — S52514A Nondisplaced fracture of right radial styloid process, initial encounter for closed fracture: Secondary | ICD-10-CM | POA: Diagnosis not present

## 2022-09-03 DIAGNOSIS — W1800XA Striking against unspecified object with subsequent fall, initial encounter: Secondary | ICD-10-CM

## 2022-09-03 NOTE — Telephone Encounter (Signed)
Pt Called stating she need a form fax to her Apartment complex to [place] sating she was seen on 08/30/22 for an Xray and that she had a broken bone that was cause from the falling. Please advise.  Phone and fax [phone]

## 2022-09-04 ENCOUNTER — Ambulatory Visit: Payer: 59 | Admitting: Orthopedic Surgery

## 2022-09-05 ENCOUNTER — Telehealth (HOSPITAL_COMMUNITY): Payer: Self-pay

## 2022-09-05 ENCOUNTER — Ambulatory Visit: Payer: 59 | Admitting: Physician Assistant

## 2022-09-05 ENCOUNTER — Encounter (HOSPITAL_COMMUNITY): Payer: Self-pay

## 2022-09-05 NOTE — Telephone Encounter (Signed)
Attempted to call patient in regards to Pulmonary Rehab - LM on VM   Sent letter 

## 2022-09-06 MED ORDER — NALTREXONE 50 MG TABLET
ORAL_TABLET | 11 refills | 0.00000 days
Start: 2022-09-06 — End: ?

## 2022-09-06 NOTE — Unmapped (Signed)
Prescription refill request for depade 50mg , Last office visit was 10/16/2020    Pt needs appt

## 2022-09-06 NOTE — Unmapped (Signed)
Hello all,     CW,     Can you please reach out to patient to schd an appt with Dr. Janyth Contes in [facility] clinic     Thanks  [facility]

## 2022-09-06 NOTE — Telephone Encounter (Signed)
"  Angela Henriksen sustained a right wrist fracture due to mechanical fall on 08/23/2022.  Her wrist was x-rayed on 08/26/2022"  Okay.  Please start her letter with the above information.  Thank you

## 2022-09-06 NOTE — Telephone Encounter (Signed)
Notified pt letter was fax to Apartment complex to [place] @ [phone]..[employer] Payment

## 2022-09-07 DIAGNOSIS — J449 Chronic obstructive pulmonary disease, unspecified: Secondary | ICD-10-CM | POA: Diagnosis not present

## 2022-09-09 NOTE — Unmapped (Signed)
Informed patient that she needs an appointment before medication can be refilled. Schedule with Janyth Contes or Judeth Porch for follow up. She stated she will call back has to check with transportation dept

## 2022-09-18 ENCOUNTER — Encounter: Payer: Self-pay | Admitting: Podiatry

## 2022-09-18 ENCOUNTER — Ambulatory Visit (INDEPENDENT_AMBULATORY_CARE_PROVIDER_SITE_OTHER): Payer: 59 | Admitting: Podiatry

## 2022-09-18 DIAGNOSIS — E1142 Type 2 diabetes mellitus with diabetic polyneuropathy: Secondary | ICD-10-CM

## 2022-09-18 DIAGNOSIS — M79674 Pain in right toe(s): Secondary | ICD-10-CM

## 2022-09-18 DIAGNOSIS — M79675 Pain in left toe(s): Secondary | ICD-10-CM | POA: Diagnosis not present

## 2022-09-18 DIAGNOSIS — B351 Tinea unguium: Secondary | ICD-10-CM | POA: Diagnosis not present

## 2022-09-18 NOTE — Progress Notes (Signed)
  Subjective:  Patient ID: [name], female    DOB: 05-Nov-1960,   MRN: [id number]  Chief Complaint  Patient presents with   Nail Problem    DFC    62 y.o. female presents for concern of thickened elongated and painful nails that are difficult to trim. Requesting to have them trimmed today. Relates burning and tingling in their feet. Patient is diabetic and last A1c was  Lab Results  Component Value Date   HGBA1C 6.0 04/09/2022   .   PCP:  Plotnikov, Georgina Quint, MD    . Denies any other pedal complaints. Denies n/v/f/c.   Past Medical History:  Diagnosis Date   ALLERGIC RHINITIS 10/26/2009   no current problems   Anxiety    B12 DEFICIENCY 08/25/2007   takes B12 supplement   Complication of anesthesia    pt has had difficulty following anesthesia with her knee in 2016-unable to care for herself afterward   Depression    takes effexor xr daily   Diabetes mellitus without complication (HCC)    was on insulin but has been off since Nov 2015 and now only takes Metformin daily   DYSPNEA 04/28/2009   uses oxygen 24/7, on 2L via [place]   Esophageal reflux    takes Nexium daily   Fibromyalgia    Headache    last migraine 2-48yrs ago;takes Topamax daily   History of shingles    Hypertension    takes Coreg daily   Insomnia    takes Nortriptyline nightly    Long-term memory loss    OSA (obstructive sleep apnea)    doesn't use CPAP;sleep study in epic from 2006   Osteoarthritis    Panic attacks    PONV (postoperative nausea and vomiting)    on time in 2016 knee surgery   Rheumatoid arthritis (HCC)    Sarcoidosis    Dr. Wilford Grist   Stroke [facility])    Vitamin D deficiency    is supposed to take Vit D but can't afford it    Objective:  Physical Exam: Vascular: DP/PT pulses 2/4 bilateral. CFT <3 seconds. Absent hair growth on digits. Edema noted to bilateral lower extremities. Xerosis noted bilaterally.  Skin. No lacerations or abrasions bilateral feet. Nails 1-5  bilateral  are thickened discolored and elongated with subungual debris.  Musculoskeletal: MMT 5/5 bilateral lower extremities in DF, PF, Inversion and Eversion. Deceased ROM in DF of ankle joint. Bunion deformity noted on right with mild tenderness to medial eminence.  Neurological: Sensation intact to light touch. Protective sensation diminished bilateral.    Assessment:   1. Pain due to onychomycosis of toenails of both feet   2. Diabetic polyneuropathy associated with type 2 diabetes mellitus (HCC)       Plan:  Patient was evaluated and treated and all questions answered. -Discussed and educated patient on diabetic foot care, especially with  regards to the vascular, neurological and musculoskeletal systems.  -Stressed the importance of good glycemic control and the detriment of not  controlling glucose levels in relation to the foot. -Discussed supportive shoes at all times and checking feet regularly.  -Mechanically debrided all nails 1-5 bilateral using sterile nail nipper and filed with dremel without incident  -Answered all patient questions -Patient to return  in 3 months for at risk foot care -Patient advised to call the office if any problems or questions arise in the meantime.   Louann Sjogren, DPM

## 2022-09-20 ENCOUNTER — Other Ambulatory Visit: Payer: Self-pay | Admitting: Internal Medicine

## 2022-09-23 ENCOUNTER — Telehealth (HOSPITAL_COMMUNITY): Payer: Self-pay

## 2022-09-23 NOTE — Telephone Encounter (Signed)
No response from pt.  Closed referral  

## 2022-09-26 MED ORDER — NALTREXONE 50 MG TABLET
ORAL_TABLET | 11 refills | 0.00000 days
Start: 2022-09-26 — End: ?

## 2022-09-26 NOTE — Unmapped (Unsigned)
Dermatology Note     Assessment and Plan:      Lichen planus  - Diagnosis, treatment options, prognosis, risk/ benefit, and side effects of treatment were discussed with the patient.   - *** halobetasol 0.05% ointment BID x 2 weeks. Discussed using occlusion with compress or plastic wrap x3-5 days when lesions flare.   - We reviewed proper use and side effects of topical steroids including striae and cutaneous atrophy.  - Patient agrees to proceed with repeat ILK today ***   Intralesional Kenalog Procedure Note: After the patient was informed of risks (including atrophy and dyspigmentation), benefits and side effects of intralesional steroid injection, the patient elected to undergo injection and verbal consent was obtained. Skin was cleaned with alcohol and injected {Kenalog Route Dermatology:22991::intralesionally} into the sites (below). The patient tolerated the procedure well without complications and was instructed on post-procedure care.  Location(s): ***  Number of sites treated: ***  Kenalog (triamcinolone) Concentration: {derm ILK conc:75518} mg/ml   Volume: *** ml total     Sarcoidosis, cutaneous and pulmonary  - Condition has been well-controlled with Humira, which was started in 06/2016 and increased to 40 mg q7d in 09/2016. For more detailed disease course, refer to PMH.   - Diagnosis, treatment options, prognosis, risk/ benefit, and side effects of treatment were discussed with the patient.   - Continue Humira 40 mg weekly. Refilled today ***  - Counseled the importance of routine screening with pulmonary (Dr. Vassie Loll), ophthalmology, ***    High risk medication use (Humira)   - Discussed the risks, benefits, alternatives and complications of the biologic medications such as reactivation of tuberculosis or fungal infections, liver damage, lowering of blood counts, injection site reactions, allergic reaction,  development of infections..  The patient expressed understanding and wished to proceed.  - Last negative quant gold on 05/24/2019. Will recheck today ***     Pruritus   - Continue hydroxyzine 25 mg tablet take 1/4 in the morning and 1/2 in the evening as needed.    - Continue naltrexone 12.5mg  (quartering 50mg  tablets) once daily    The patient was advised to call for an appointment should any new, changing, or symptomatic lesions develop.     RTC: No follow-ups on file. or sooner as needed   _________________________________________________________________      Chief Complaint     No chief complaint on file.      HPI     Carolyn Newton is a 62 y.o. female who presents as a returning patient (last seen by Dr. Janyth Contes on 10/16/2020) to [facility] for follow up of lichen planus. At last visit, patient was to start halobetasol 0.05% ointment for lichen planus and received an ILK-10 injection to the bilateral feet for her LP. She was also to continue Atarax 25 mg (1/4 in the morning and 1/2 in the evening) and naltrexone 12.5mg  (quartering 50mg  tablets) once daily for pruritus.         The patient denies any other new or changing lesions or areas of concern.     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  Many years on and off of prednisone with temporary improvement, but overall thinks she is worse over time.  Has had a large amount of weight gain in the last year  since restarting prednisone in April 2016.   Was on methotrexate given worsening cutaneous involvement of the scalp.  Dose was 10mg  taken 2 doses 12 hours apart in winter 2017-18.  Did that for 3 months, but stopped because she thought it wasn't helping.  Had been on hydroxychloroquine many years ago, but it was not very helpful.  Has been on doxycycline many times over the years without much improvement.              Family History:   Negative for melanoma    Past Medical History, Family History, Social History, Medication List, Allergies, and Problem List were reviewed in the rooming section of Epic.     ROS: Other than symptoms mentioned in the HPI, no fevers, chills, or other skin complaints    Physical Examination     GENERAL: Well-appearing female in no acute distress, resting comfortably.  NEURO: Alert and oriented, answers questions appropriately  PSYCH: Normal mood and affect  {PE extent:75514}  {PE list:75421}  ***    All areas not commented on are within normal limits or unremarkable      (Approved Template 09/27/2019)

## 2022-09-27 ENCOUNTER — Ambulatory Visit: Admit: 2022-09-27 | Discharge: 2022-09-28 | Payer: MEDICARE

## 2022-09-27 DIAGNOSIS — D869 Sarcoidosis, unspecified: Secondary | ICD-10-CM | POA: Diagnosis not present

## 2022-09-27 DIAGNOSIS — L439 Lichen planus, unspecified: Secondary | ICD-10-CM | POA: Diagnosis not present

## 2022-09-27 DIAGNOSIS — Z79899 Other long term (current) drug therapy: Secondary | ICD-10-CM | POA: Diagnosis not present

## 2022-09-27 MED ORDER — HUMIRA PEN CITRATE FREE 40 MG/0.4 ML
SUBCUTANEOUS | 11 refills | 28.00000 days | Status: CP
Start: 2022-09-27 — End: ?
  Filled 2022-10-14: qty 4, 28d supply, fill #0

## 2022-09-27 MED ORDER — CLOBETASOL 0.05 % TOPICAL CREAM
Freq: Two times a day (BID) | TOPICAL | 1 refills | 0.00000 days | Status: CP
Start: 2022-09-27 — End: 2023-09-27

## 2022-09-27 MED ORDER — NALTREXONE 50 MG TABLET
ORAL_TABLET | 11 refills | 0.00000 days
Start: 2022-09-27 — End: ?

## 2022-09-27 MED ORDER — HALOBETASOL PROPIONATE 0.05 % TOPICAL OINTMENT
Freq: Two times a day (BID) | TOPICAL | 1 refills | 0.00000 days | Status: CN
Start: 2022-09-27 — End: 2023-09-27

## 2022-09-30 ENCOUNTER — Ambulatory Visit (INDEPENDENT_AMBULATORY_CARE_PROVIDER_SITE_OTHER): Payer: 59 | Admitting: Internal Medicine

## 2022-09-30 VITALS — BP 120/78 | HR 72 | Temp 97.8°F | Ht 67.0 in | Wt 280.0 lb

## 2022-09-30 DIAGNOSIS — F419 Anxiety disorder, unspecified: Secondary | ICD-10-CM

## 2022-09-30 DIAGNOSIS — F339 Major depressive disorder, recurrent, unspecified: Secondary | ICD-10-CM | POA: Diagnosis not present

## 2022-09-30 DIAGNOSIS — E538 Deficiency of other specified B group vitamins: Secondary | ICD-10-CM

## 2022-09-30 DIAGNOSIS — G4700 Insomnia, unspecified: Secondary | ICD-10-CM

## 2022-09-30 DIAGNOSIS — E559 Vitamin D deficiency, unspecified: Secondary | ICD-10-CM | POA: Diagnosis not present

## 2022-09-30 DIAGNOSIS — Z23 Encounter for immunization: Secondary | ICD-10-CM

## 2022-09-30 DIAGNOSIS — I10 Essential (primary) hypertension: Secondary | ICD-10-CM

## 2022-09-30 MED ORDER — QUETIAPINE FUMARATE ER 300 MG PO TB24: 300.0000 mg | ORAL_TABLET | Freq: Every day | ORAL | 5 refills | Status: DC

## 2022-09-30 MED ORDER — MORPHINE SULFATE 30 MG PO TABS: 30.0000 mg | ORAL_TABLET | Freq: Two times a day (BID) | ORAL | 0 refills | Status: DC | PRN

## 2022-09-30 MED ORDER — CLOTRIMAZOLE-BETAMETHASONE 1-0.05 % EX CREA: 1.0000 | TOPICAL_CREAM | Freq: Two times a day (BID) | CUTANEOUS | 1 refills | Status: DC

## 2022-09-30 NOTE — Assessment & Plan Note (Signed)
Cont on Coreg, Lasix

## 2022-09-30 NOTE — Progress Notes (Signed)
Subjective:  Patient ID: [name], female    DOB: April 10, 1960  Age: 62 y.o. MRN: [id number]  CC: Follow-up (4 week f/u)   HPI [name] presents for intertrigo, LBP, depression C/o insomnia - Seroquel is not helping  Outpatient Medications Prior to Visit  Medication Sig Dispense Refill   acetaminophen (TYLENOL) 325 MG tablet Take 1-2 tablets (325-650 mg total) by mouth every 6 (six) hours as needed for mild pain (pain score 1-3 or temp > 100.5). 30 tablet 1   albuterol (PROVENTIL) (2.5 MG/3ML) 0.083% nebulizer solution Take 3 mLs (2.5 mg total) by nebulization every 6 (six) hours as needed for wheezing or shortness of breath. 75 mL 5   albuterol (VENTOLIN HFA) 108 (90 Base) MCG/ACT inhaler INHALE TWO puffs into THE lungs EVERY SIX HOURS AS NEEDED wheezind AND For SHORTNESS OF BREATH 6.7 g 3   B-D ULTRA-FINE 33 LANCETS MISC Use as instructed to test sugars once  daily 100 each 12   budesonide-formoterol (SYMBICORT) 160-4.5 MCG/ACT inhaler INHALE TWO puffs into THE lungs TWICE DAILY 10.2 g 5   carvedilol (COREG) 12.5 MG tablet Take 1 tablet (12.5 mg total) by mouth 2 (two) times daily with a meal. 180 tablet 1   cefdinir (OMNICEF) 300 MG capsule Take 1 capsule (300 mg total) by mouth 2 (two) times daily. 20 capsule 0   Cholecalciferol (VITAMIN D-3) 125 MCG (5000 UT) TABS Take 5,000 Units by mouth daily.     Cyanocobalamin (VITAMIN B-12) 1000 MCG SUBL Place 1 tablet (1,000 mcg total) under the tongue daily. 100 tablet 3   dexlansoprazole (DEXILANT) 60 MG capsule TAKE ONE CAPSULE BY MOUTH EVERY DAY 90 capsule 1   erythromycin ophthalmic ointment as directed.     gabapentin (NEURONTIN) 300 MG capsule TAKE 1 CAPSULE BY MOUTH EVERY MORNING. MAY TAKE SECOND CAPSULE DURING THE DAY AS NEEDED FOR PAIN 180 capsule 1   glucose blood (ACCU-CHEK GUIDE) test strip Use to check blood sugar once a day. 100 each 12   HUMIRA PEN 40 MG/0.4ML PNKT Inject 40 mg as directed every  Wednesday.      hydrOXYzine (ATARAX) 25 MG tablet TAKE ONE TABLET BY MOUTH EVERY EIGHT HOURS AS NEEDED 60 tablet 3   metFORMIN (GLUCOPHAGE-XR) 500 MG 24 hr tablet Take 2 tablets (1,000 mg total) by mouth at bedtime. 180 tablet 1   moxifloxacin (VIGAMOX) 0.5 % ophthalmic solution Apply to eye as directed.     Multiple Vitamins-Minerals (MULTIVITAMIN WOMEN 50+) TABS Take 1 tablet by mouth daily.     naloxone (NARCAN) nasal spray 4 mg/0.1 mL Place 1 spray into the nose once as needed (overdose).     naltrexone (DEPADE) 50 MG tablet Take 50 mg by mouth daily.     OXYGEN Inhale 2 L into the lungs continuous.     polyvinyl alcohol (LIQUIFILM TEARS) 1.4 % ophthalmic solution Place 1 drop into both eyes as needed for dry eyes. 15 mL 0   potassium chloride (KLOR-CON M) 10 MEQ tablet TAKE ONE TABLET BY MOUTH EVERY DAY 90 tablet 1   rivaroxaban (XARELTO) 20 MG TABS tablet TAKE ONE TABLET BY MOUTH EVERY DAY 90 tablet 3   tobramycin-dexamethasone (TOBRADEX) ophthalmic solution as directed.     topiramate (TOPAMAX) 50 MG tablet TAKE ONE TABLET BY MOUTH TWICE DAILY 180 tablet 1   venlafaxine XR (EFFEXOR-XR) 75 MG 24 hr capsule TAKE ONE CAPSULE BY MOUTH EVERY DAY WITH breakfast 30 capsule 11   morphine (MSIR)  30 MG tablet Take 1 tablet (30 mg total) by mouth 2 (two) times daily as needed for severe pain. 60 tablet 0   nystatin ointment (MYCOSTATIN) Apply 1 Application topically 2 (two) times daily. 100 g 0   QUEtiapine (SEROQUEL) 50 MG tablet Take 1 tab in am and 2 tab at bedtime 90 tablet 5   No facility-administered medications prior to visit.    ROS: Review of Systems  Constitutional:  Positive for fatigue. Negative for activity change, appetite change, chills and unexpected weight change.  HENT:  Negative for congestion, mouth sores and sinus pressure.   Eyes:  Negative for visual disturbance.  Respiratory:  Negative for cough and chest tightness.   Cardiovascular:  Positive for leg swelling.   Gastrointestinal:  Negative for abdominal pain and nausea.  Genitourinary:  Negative for difficulty urinating, frequency and vaginal pain.  Musculoskeletal:  Positive for arthralgias, back pain, gait problem, joint swelling and neck stiffness.  Skin:  Positive for rash. Negative for pallor.  Neurological:  Positive for weakness. Negative for dizziness, tremors, numbness and headaches.  Psychiatric/Behavioral:  Positive for sleep disturbance. Negative for confusion and suicidal ideas. The patient is nervous/anxious.     Objective:  BP 120/78 (BP Location: Left Arm, Patient Position: Sitting, Cuff Size: Large)   Pulse 72   Temp 97.8 F (36.6 C) (Oral)   Ht 5\' 7"  (1.702 m)   Wt 280 lb (127 kg)   LMP 05/19/2003   SpO2 90%   BMI 43.85 kg/m   BP Readings from Last 3 Encounters:  09/30/22 120/78  08/26/22 130/80  07/10/22 130/80    Wt Readings from Last 3 Encounters:  09/30/22 280 lb (127 kg)  08/26/22 277 lb (125.6 kg)  07/10/22 270 lb (122.5 kg)    Physical Exam Constitutional:      General: She is not in acute distress.    Appearance: She is well-developed. She is obese.  HENT:     Head: Normocephalic.     Right Ear: External ear normal.     Left Ear: External ear normal.     Nose: Nose normal.  Eyes:     General:        Right eye: No discharge.        Left eye: No discharge.     Conjunctiva/sclera: Conjunctivae normal.     Pupils: Pupils are equal, round, and reactive to light.  Neck:     Thyroid: No thyromegaly.     Vascular: No JVD.     Trachea: No tracheal deviation.  Cardiovascular:     Rate and Rhythm: Normal rate and regular rhythm.     Heart sounds: Normal heart sounds.  Pulmonary:     Effort: No respiratory distress.     Breath sounds: No stridor. No wheezing.  Abdominal:     General: Bowel sounds are normal. There is no distension.     Palpations: Abdomen is soft. There is no mass.     Tenderness: There is no abdominal tenderness. There is no  guarding or rebound.  Musculoskeletal:        General: Tenderness present.     Cervical back: Normal range of motion and neck supple. No rigidity.     Right lower leg: No edema.     Left lower leg: No edema.  Lymphadenopathy:     Cervical: No cervical adenopathy.  Skin:    Findings: No erythema or rash.  Neurological:     Mental Status: She is oriented  to person, place, and time. Mental status is at baseline.     Cranial Nerves: No cranial nerve deficit.     Motor: No abnormal muscle tone.     Coordination: Coordination normal.     Gait: Gait abnormal.     Deep Tendon Reflexes: Reflexes normal.  Psychiatric:        Behavior: Behavior normal.        Thought Content: Thought content normal.        Judgment: Judgment normal.     Lab Results  Component Value Date   WBC 7.1 12/10/2021   HGB 12.7 12/10/2021   HCT 38.3 12/10/2021   PLT 214.0 12/10/2021   GLUCOSE 104 (H) 12/10/2021   CHOL 210 (H) 07/19/2020   TRIG 109.0 07/19/2020   HDL 77.20 07/19/2020   LDLDIRECT 107.0 06/30/2017   LDLCALC 111 (H) 07/19/2020   ALT 13 12/10/2021   AST 19 12/10/2021   NA 137 12/10/2021   K 3.9 12/10/2021   CL 103 12/10/2021   CREATININE 0.94 12/10/2021   BUN 13 12/10/2021   CO2 28 12/10/2021   TSH 1.18 07/02/2021   INR 1.00 02/11/2018   HGBA1C 6.0 04/09/2022   MICROALBUR <0.7 04/09/2022    CT Chest Wo Contrast  Result Date: 08/23/2022 CLINICAL DATA:  Follow-up left lower lobe process. EXAM: CT CHEST WITHOUT CONTRAST TECHNIQUE: Multidetector CT imaging of the chest was performed following the standard protocol without IV contrast. RADIATION DOSE REDUCTION: This exam was performed according to the departmental dose-optimization program which includes automated exposure control, adjustment of the mA and/or kV according to patient size and/or use of iterative reconstruction technique. COMPARISON:  Chest CT 06/06/2022 FINDINGS: Cardiovascular: The heart is normal in size. No pericardial  effusion. Stable age advanced atherosclerotic calcifications involving the aorta and coronary arteries. Mediastinum/Nodes: Stable extensive partially calcified mediastinal and hilar lymphadenopathy consistent with known sarcoidosis. The esophagus is grossly normal. 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 The superior segment left lower lobe process has improved and is likely resolving atelectasis. This area measures 3.1 x 1.2 cm and previously measured 5.0 x 2.2 cm. No worrisome pulmonary lesions or nodules.  No pleural effusions. Upper Abdomen: No significant upper abdominal findings. Stable aortic calcifications. Musculoskeletal: No significant bony findings. IMPRESSION: 1. Stable extensive partially calcified mediastinal and hilar lymphadenopathy consistent with known sarcoidosis. 2. 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 3. The superior segment left lower lobe process has improved and is likely resolving atelectasis. Recommend follow-up noncontrast chest CT in 3-6 months 4. No worrisome pulmonary lesions or nodules. Aortic Atherosclerosis (ICD10-I70.0). Electronically Signed   By: Rudie Meyer M.D.   On: 08/23/2022 08:56    Assessment & Plan:   Problem List Items Addressed This Visit     B12 deficiency    On B12      Vitamin D deficiency    Re-start Vit D      Anxiety disorder     Severe anxiety and insomnia  On Seroquel. Cont on Effexor.  Potential benefits of a long term Seroquel use as well as potential risks and complications (i.e. somnolence, constipation and others) were explained to the patient and were aknowledged.      Major depressive disorder, recurrent episode (HCC)    Worse: severe anxiety and insomnia - worse (off Clonazepam x 2 months due to her [facility] requirement). Start on Seroquel.   Potential benefits of a long term Seroquel use as well as potential  risks and complications (i.e. somnolence, constipation  and others) were explained to the patient and were aknowledged. On  seroquel      Essential hypertension    Cont on Coreg, Lasix      INSOMNIA, CHRONIC    Will increase Seroquel to 300 mg at hs. Changed to XL      Other Visit Diagnoses     Need for influenza vaccination    -  Primary   Relevant Orders   Flu Vaccine Trivalent High Dose (Fluad) (Completed)         Meds ordered this encounter  Medications   clotrimazole-betamethasone (LOTRISONE) cream    Sig: Apply 1 Application topically 2 (two) times daily.    Dispense:  90 g    Refill:  1   morphine (MSIR) 30 MG tablet    Sig: Take 1 tablet (30 mg total) by mouth 2 (two) times daily as needed for severe pain.    Dispense:  60 tablet    Refill:  0   QUEtiapine (SEROQUEL XR) 300 MG 24 hr tablet    Sig: Take 1 tablet (300 mg total) by mouth at bedtime.    Dispense:  30 tablet    Refill:  5      Follow-up: Return in about 2 months (around 11/30/2022) for a follow-up visit.  Sonda Primes, MD

## 2022-09-30 NOTE — Assessment & Plan Note (Signed)
On B12 

## 2022-09-30 NOTE — Assessment & Plan Note (Signed)
Will increase Seroquel to 300 mg at hs. Changed to XL

## 2022-09-30 NOTE — Assessment & Plan Note (Signed)
Worse: severe anxiety and insomnia - worse (off Clonazepam x 2 months due to her [facility] requirement). Start on Seroquel.   Potential benefits of a long term Seroquel use as well as potential risks and complications (i.e. somnolence, constipation and others) were explained to the patient and were aknowledged. On  seroquel

## 2022-09-30 NOTE — Assessment & Plan Note (Signed)
  Severe anxiety and insomnia  On Seroquel. Cont on Effexor.  Potential benefits of a long term Seroquel use as well as potential risks and complications (i.e. somnolence, constipation and others) were explained to the patient and were aknowledged.

## 2022-09-30 NOTE — Assessment & Plan Note (Signed)
Re-start Vit D

## 2022-10-01 DIAGNOSIS — S52514D Nondisplaced fracture of right radial styloid process, subsequent encounter for closed fracture with routine healing: Secondary | ICD-10-CM | POA: Diagnosis not present

## 2022-10-02 LAB — TB NIL: TB NIL VALUE: 0.1

## 2022-10-02 LAB — QUANTIFERON TB GOLD PLUS
QUANTIFERON ANTIGEN 1 MINUS NIL: -0.02 [IU]/mL
QUANTIFERON ANTIGEN 2 MINUS NIL: -0.01 [IU]/mL
QUANTIFERON MITOGEN: 9.9 [IU]/mL
QUANTIFERON TB GOLD PLUS: NEGATIVE
QUANTIFERON TB NIL VALUE: 0.1 [IU]/mL

## 2022-10-02 LAB — TB AG2: TB AG2 VALUE: 0.09

## 2022-10-02 LAB — TB AG1: TB AG1 VALUE: 0.08

## 2022-10-02 LAB — TB MITOGEN: TB MITOGEN VALUE: 10

## 2022-10-08 DIAGNOSIS — J449 Chronic obstructive pulmonary disease, unspecified: Secondary | ICD-10-CM | POA: Diagnosis not present

## 2022-10-10 ENCOUNTER — Other Ambulatory Visit: Payer: Self-pay | Admitting: Adult Health

## 2022-10-10 MED ORDER — BUDESONIDE-FORMOTEROL FUMARATE 80-4.5 MCG/ACT IN AERO: 2.0000 | INHALATION_SPRAY | Freq: Two times a day (BID) | RESPIRATORY_TRACT | 12 refills | Status: DC

## 2022-10-11 ENCOUNTER — Other Ambulatory Visit: Payer: Self-pay | Admitting: Adult Health

## 2022-10-13 NOTE — Unmapped (Signed)
[facility] Refill Coordination Note    Specialty Medication(s) to be Shipped:   Inflammatory Disorders: Humira    Other medication(s) to be shipped: No additional medications requested for fill at this time     Carolyn Newton, DOB: [birth year]  Phone: [phone] (home)       All above HIPAA information was verified with patient.     Was a Nurse, learning disability used for this call? No    Completed refill call assessment today to schedule patient's medication shipment from the [facility] [phone]).  All relevant notes have been reviewed.     Specialty medication(s) and dose(s) confirmed: Regimen is correct and unchanged.   Changes to medications: [name] reports starting the following medications: morphine (MSIR) 30 MG Take 1 tablet (30 mg total) by mouth    Seroquel 300 mg One at bedtime  Changes to insurance: No  New side effects reported not previously addressed with a pharmacist or physician: None reported  Questions for the pharmacist: No    Confirmed patient received a Conservation officer, historic buildings and a Surveyor, mining with first shipment. The patient will receive a drug information handout for each medication shipped and additional FDA Medication Guides as required.       DISEASE/MEDICATION-SPECIFIC INFORMATION        For patients on injectable medications: Patient currently has 1 doses left.  Next injection is scheduled for 10/16/2022.    SPECIALTY MEDICATION ADHERENCE     Medication Adherence    Patient reported X missed doses in the last month: 0  Specialty Medication: HUMIRA PEN CITRATE FREE 40 MG/0.4 ML  Patient is on additional specialty medications: No  Informant: patient                Were doses missed due to medication being on hold? No    HUMIRA(CF) PEN 40 /0.4   mg/ml: 3 days of medicine on hand       REFERRAL TO PHARMACIST     Referral to the pharmacist: Not needed      [facility]     Shipping address confirmed in Epic.       Delivery Scheduled: Yes, Expected medication delivery date: 010/01/2022. *added note to delivery for sig required per patient request    Medication will be delivered via UPS to the prescription address in Epic WAM.    Carolyn Newton   [facility] Specialty Technician

## 2022-10-15 ENCOUNTER — Other Ambulatory Visit: Payer: Self-pay | Admitting: Internal Medicine

## 2022-10-15 ENCOUNTER — Telehealth: Payer: Self-pay | Admitting: Internal Medicine

## 2022-10-15 NOTE — Telephone Encounter (Signed)
Prescription Request  10/15/2022  LOV: 09/30/2022  What is the name of the medication or equipment?  metFORMIN (GLUCOPHAGE-XR) 500 MG 24 hr tablet   Have you contacted your pharmacy to request a refill? No   Which pharmacy would you like this sent to?   [facility] - [place], [place] - [address] [place] [address] Phone: [phone] Fax: [phone]  Patient notified that their request is being sent to the clinical staff for review and that they should receive a response within 2 business days.   Please advise at Mobile [phone] (mobile)

## 2022-10-16 ENCOUNTER — Other Ambulatory Visit: Payer: Self-pay

## 2022-10-16 MED ORDER — METFORMIN HCL ER 500 MG PO TB24: 500.0000 mg | ORAL_TABLET | Freq: Every day | ORAL | 3 refills | Status: DC

## 2022-10-22 ENCOUNTER — Telehealth: Payer: Self-pay | Admitting: Internal Medicine

## 2022-10-22 ENCOUNTER — Ambulatory Visit: Admit: 2022-10-22 | Payer: MEDICARE

## 2022-10-22 NOTE — Telephone Encounter (Signed)
Patient called and said she recently fell and thinks it may be related to her QUEtiapine (SEROQUEL XR) 300 MG 24 hr tablet. She said she is wondering if is too much for her. She said that she did not hit her head when she fell, but she fell hard enough to lose control of her bladder. She said she does not feel comfortable driving due to the incident and that she does not have anyone that can take her to an appointment. Patient would like a call back at [phone].

## 2022-10-24 MED ORDER — QUETIAPINE FUMARATE ER 200 MG PO TB24: 200.0000 mg | ORAL_TABLET | Freq: Every day | ORAL | 5 refills | Status: DC

## 2022-10-24 NOTE — Telephone Encounter (Signed)
I am sorry about the fall.  We will reduce Seroquel XR to 200 mg at night.  A new prescription was sent in.  Thank you

## 2022-10-24 NOTE — Telephone Encounter (Signed)
I was able to speak with the pt and inform her of the medication change per Provider instructions.

## 2022-10-29 ENCOUNTER — Ambulatory Visit (INDEPENDENT_AMBULATORY_CARE_PROVIDER_SITE_OTHER): Payer: 59

## 2022-10-29 ENCOUNTER — Ambulatory Visit: Payer: 59 | Admitting: Internal Medicine

## 2022-10-29 ENCOUNTER — Encounter: Payer: Self-pay | Admitting: Internal Medicine

## 2022-10-29 VITALS — BP 120/68 | HR 70 | Temp 98.2°F | Ht 67.0 in | Wt 279.0 lb

## 2022-10-29 DIAGNOSIS — S93409A Sprain of unspecified ligament of unspecified ankle, initial encounter: Secondary | ICD-10-CM | POA: Insufficient documentation

## 2022-10-29 DIAGNOSIS — M25532 Pain in left wrist: Secondary | ICD-10-CM | POA: Diagnosis not present

## 2022-10-29 DIAGNOSIS — L03116 Cellulitis of left lower limb: Secondary | ICD-10-CM | POA: Diagnosis not present

## 2022-10-29 DIAGNOSIS — W1800XA Striking against unspecified object with subsequent fall, initial encounter: Secondary | ICD-10-CM

## 2022-10-29 DIAGNOSIS — S93402A Sprain of unspecified ligament of left ankle, initial encounter: Secondary | ICD-10-CM | POA: Diagnosis not present

## 2022-10-29 DIAGNOSIS — W19XXXA Unspecified fall, initial encounter: Secondary | ICD-10-CM | POA: Diagnosis not present

## 2022-10-29 DIAGNOSIS — M1812 Unilateral primary osteoarthritis of first carpometacarpal joint, left hand: Secondary | ICD-10-CM | POA: Diagnosis not present

## 2022-10-29 DIAGNOSIS — S63502A Unspecified sprain of left wrist, initial encounter: Secondary | ICD-10-CM

## 2022-10-29 DIAGNOSIS — M25572 Pain in left ankle and joints of left foot: Secondary | ICD-10-CM | POA: Diagnosis not present

## 2022-10-29 DIAGNOSIS — S63509A Unspecified sprain of unspecified wrist, initial encounter: Secondary | ICD-10-CM | POA: Insufficient documentation

## 2022-10-29 DIAGNOSIS — F419 Anxiety disorder, unspecified: Secondary | ICD-10-CM

## 2022-10-29 DIAGNOSIS — M7732 Calcaneal spur, left foot: Secondary | ICD-10-CM | POA: Diagnosis not present

## 2022-10-29 MED ORDER — QUETIAPINE FUMARATE ER 200 MG PO TB24: ORAL_TABLET | ORAL | Status: DC

## 2022-10-29 MED ORDER — CEFTRIAXONE SODIUM 500 MG IJ SOLR
500.0000 mg | Freq: Once | INTRAMUSCULAR | Status: AC
Start: 2022-10-29 — End: 2022-10-29
  Administered 2022-10-29: 500 mg via INTRAMUSCULAR

## 2022-10-29 MED ORDER — CEFUROXIME AXETIL 500 MG PO TABS: 500.0000 mg | ORAL_TABLET | Freq: Two times a day (BID) | ORAL | 0 refills | Status: AC

## 2022-10-29 NOTE — Assessment & Plan Note (Addendum)
S/p fall L wrist w/pain Will get an X ray ACE wrap

## 2022-10-29 NOTE — Assessment & Plan Note (Signed)
S/p fall L ankle w/pain Will get an X ray

## 2022-10-29 NOTE — Progress Notes (Signed)
Subjective:  Patient ID: [name], female    DOB: 1960-07-27  Age: 62 y.o. MRN: [id number]  CC: Medication Problem (Pt has fallen and believes it was due to medication increase. Pt has an abrasion on the back of her lt thigh/knee area a/w redness and swollen... Lt hand swelling into wrist a/w pain radiating in lt arm to hand.)   HPI Magdalen Spiller presents for a probable LOC on Sunday - weak, could not get up. She thinks it was due to Seroquel dose increase (it may be related to her QUEtiapine (SEROQUEL XR) 300 MG 24 hr tablet )...  There was no LOC, but she felt weird, uncoordinated...  Outpatient Medications Prior to Visit  Medication Sig Dispense Refill   acetaminophen (TYLENOL) 325 MG tablet Take 1-2 tablets (325-650 mg total) by mouth every 6 (six) hours as needed for mild pain (pain score 1-3 or temp > 100.5). 30 tablet 1   albuterol (PROVENTIL) (2.5 MG/3ML) 0.083% nebulizer solution Take 3 mLs (2.5 mg total) by nebulization every 6 (six) hours as needed for wheezing or shortness of breath. 75 mL 5   albuterol (VENTOLIN HFA) 108 (90 Base) MCG/ACT inhaler INHALE TWO puffs into THE lungs EVERY SIX HOURS AS NEEDED wheezind AND For SHORTNESS OF BREATH 6.7 g 3   B-D ULTRA-FINE 33 LANCETS MISC Use as instructed to test sugars once  daily 100 each 12   budesonide-formoterol (SYMBICORT) 80-4.5 MCG/ACT inhaler Inhale 2 puffs into the lungs 2 (two) times daily. INHALE TWO puffs into THE lungs TWICE DAILY 1 each 12   carvedilol (COREG) 12.5 MG tablet TAKE ONE TABLET BY MOUTH TWICE DAILY With meals 180 tablet 3   cefdinir (OMNICEF) 300 MG capsule Take 1 capsule (300 mg total) by mouth 2 (two) times daily. 20 capsule 0   Cholecalciferol (VITAMIN D-3) 125 MCG (5000 UT) TABS Take 5,000 Units by mouth daily.     Cyanocobalamin (VITAMIN B-12) 1000 MCG SUBL Place 1 tablet (1,000 mcg total) under the tongue daily. 100 tablet 3   dexlansoprazole (DEXILANT) 60 MG capsule TAKE ONE  CAPSULE BY MOUTH EVERY DAY 90 capsule 1   erythromycin ophthalmic ointment as directed.     gabapentin (NEURONTIN) 300 MG capsule TAKE 1 CAPSULE BY MOUTH EVERY MORNING. MAY TAKE SECOND CAPSULE DURING THE DAY AS NEEDED FOR PAIN 180 capsule 1   glucose blood (ACCU-CHEK GUIDE) test strip Use to check blood sugar once a day. 100 each 12   HUMIRA PEN 40 MG/0.4ML PNKT Inject 40 mg as directed every Wednesday.      hydrOXYzine (ATARAX) 25 MG tablet TAKE ONE TABLET BY MOUTH EVERY EIGHT HOURS AS NEEDED 60 tablet 3   metFORMIN (GLUCOPHAGE-XR) 500 MG 24 hr tablet Take 1 tablet (500 mg total) by mouth at bedtime. 180 tablet 3   morphine (MSIR) 30 MG tablet Take 1 tablet (30 mg total) by mouth 2 (two) times daily as needed for severe pain. 60 tablet 0   moxifloxacin (VIGAMOX) 0.5 % ophthalmic solution Apply to eye as directed.     Multiple Vitamins-Minerals (MULTIVITAMIN WOMEN 50+) TABS Take 1 tablet by mouth daily.     naloxone (NARCAN) nasal spray 4 mg/0.1 mL Place 1 spray into the nose once as needed (overdose).     naltrexone (DEPADE) 50 MG tablet Take 50 mg by mouth daily.     OXYGEN Inhale 2 L into the lungs continuous.     polyvinyl alcohol (LIQUIFILM TEARS) 1.4 % ophthalmic solution  Place 1 drop into both eyes as needed for dry eyes. 15 mL 0   potassium chloride (KLOR-CON M) 10 MEQ tablet TAKE ONE TABLET BY MOUTH EVERY DAY 90 tablet 1   rivaroxaban (XARELTO) 20 MG TABS tablet TAKE ONE TABLET BY MOUTH EVERY DAY 90 tablet 3   tobramycin-dexamethasone (TOBRADEX) ophthalmic solution as directed.     topiramate (TOPAMAX) 50 MG tablet TAKE ONE TABLET BY MOUTH TWICE DAILY 180 tablet 1   venlafaxine XR (EFFEXOR-XR) 75 MG 24 hr capsule TAKE ONE CAPSULE BY MOUTH EVERY DAY WITH breakfast 30 capsule 11   clotrimazole-betamethasone (LOTRISONE) cream Apply 1 Application topically 2 (two) times daily. 90 g 1   QUEtiapine (SEROQUEL XR) 200 MG 24 hr tablet Take 1 tablet (200 mg total) by mouth at bedtime. 30 tablet 5    No facility-administered medications prior to visit.    ROS: Review of Systems  Constitutional:  Positive for fatigue. Negative for activity change, appetite change, chills and unexpected weight change.  HENT:  Negative for congestion, mouth sores and sinus pressure.   Eyes:  Negative for visual disturbance.  Respiratory:  Negative for cough and chest tightness.   Gastrointestinal:  Negative for abdominal pain, blood in stool, nausea and vomiting.  Genitourinary:  Positive for urgency. Negative for difficulty urinating, dysuria, frequency and vaginal pain.  Musculoskeletal:  Positive for arthralgias, back pain and gait problem.  Skin:  Negative for pallor and rash.  Neurological:  Positive for weakness. Negative for dizziness, tremors, numbness and headaches.  Hematological:  Bruises/bleeds easily.  Psychiatric/Behavioral:  Positive for sleep disturbance. Negative for confusion and suicidal ideas. The patient is nervous/anxious.     Objective:  BP 120/68 (BP Location: Left Arm, Patient Position: Sitting, Cuff Size: Normal)   Pulse 70   Temp 98.2 F (36.8 C) (Oral)   Ht 5\' 7"  (1.702 m)   Wt 279 lb (126.6 kg)   LMP 05/19/2003   SpO2 91%   BMI 43.70 kg/m   BP Readings from Last 3 Encounters:  10/29/22 120/68  09/30/22 120/78  08/26/22 130/80    Wt Readings from Last 3 Encounters:  10/29/22 279 lb (126.6 kg)  09/30/22 280 lb (127 kg)  08/26/22 277 lb (125.6 kg)    Physical Exam Constitutional:      General: She is not in acute distress.    Appearance: She is well-developed. She is obese.  HENT:     Head: Normocephalic.     Right Ear: External ear normal.     Left Ear: External ear normal.     Nose: Nose normal.  Eyes:     General:        Right eye: No discharge.        Left eye: No discharge.     Conjunctiva/sclera: Conjunctivae normal.     Pupils: Pupils are equal, round, and reactive to light.  Neck:     Thyroid: No thyromegaly.     Vascular: No JVD.      Trachea: No tracheal deviation.  Cardiovascular:     Rate and Rhythm: Normal rate and regular rhythm.     Heart sounds: Normal heart sounds.  Pulmonary:     Effort: No respiratory distress.     Breath sounds: No stridor. No wheezing.  Abdominal:     General: Bowel sounds are normal. There is no distension.     Palpations: Abdomen is soft. There is no mass.     Tenderness: There is no abdominal tenderness. There  is no guarding or rebound.  Musculoskeletal:        General: No tenderness or deformity.     Cervical back: Normal range of motion and neck supple. No rigidity.     Right lower leg: No edema.     Left lower leg: No edema.  Lymphadenopathy:     Cervical: No cervical adenopathy.  Skin:    Findings: No erythema or rash.  Neurological:     Mental Status: She is oriented to person, place, and time. Mental status is at baseline.     Cranial Nerves: No cranial nerve deficit.     Motor: Weakness present. No abnormal muscle tone.     Coordination: Coordination abnormal.     Gait: Gait abnormal.     Deep Tendon Reflexes: Reflexes normal.  Psychiatric:        Behavior: Behavior normal.        Thought Content: Thought content normal.        Judgment: Judgment normal.    Using a walker L wrist is swollen and w/pain L ankle w/pain L posterior distal thigh with abrasion and the palm size area of firm erythema, sensitive; no pus    A total time of 45 minutes was spent preparing to see the patient, reviewing tests, x-rays, operative reports and other medical records.  Also, obtaining history and performing comprehensive physical exam.  Additionally, counseling the patient regarding the above listed issues- fall, MSK injuries, ?cellulitis.   Finally, documenting clinical information in the health records, coordination of care, educating the patient.   Lab Results  Component Value Date   WBC 7.1 12/10/2021   HGB 12.7 12/10/2021   HCT 38.3 12/10/2021   PLT 214.0 12/10/2021    GLUCOSE 104 (H) 12/10/2021   CHOL 210 (H) 07/19/2020   TRIG 109.0 07/19/2020   HDL 77.20 07/19/2020   LDLDIRECT 107.0 06/30/2017   LDLCALC 111 (H) 07/19/2020   ALT 13 12/10/2021   AST 19 12/10/2021   NA 137 12/10/2021   K 3.9 12/10/2021   CL 103 12/10/2021   CREATININE 0.94 12/10/2021   BUN 13 12/10/2021   CO2 28 12/10/2021   TSH 1.18 07/02/2021   INR 1.00 02/11/2018   HGBA1C 6.0 04/09/2022   MICROALBUR <0.7 04/09/2022    CT Chest Wo Contrast  Result Date: 08/23/2022 CLINICAL DATA:  Follow-up left lower lobe process. EXAM: CT CHEST WITHOUT CONTRAST TECHNIQUE: Multidetector CT imaging of the chest was performed following the standard protocol without IV contrast. RADIATION DOSE REDUCTION: This exam was performed according to the departmental dose-optimization program which includes automated exposure control, adjustment of the mA and/or kV according to patient size and/or use of iterative reconstruction technique. COMPARISON:  Chest CT 06/06/2022 FINDINGS: Cardiovascular: The heart is normal in size. No pericardial effusion. Stable age advanced atherosclerotic calcifications involving the aorta and coronary arteries. Mediastinum/Nodes: Stable extensive partially calcified mediastinal and hilar lymphadenopathy consistent with known sarcoidosis. The esophagus is grossly normal. 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 The superior segment left lower lobe process has improved and is likely resolving atelectasis. This area measures 3.1 x 1.2 cm and previously measured 5.0 x 2.2 cm. No worrisome pulmonary lesions or nodules.  No pleural effusions. Upper Abdomen: No significant upper abdominal findings. Stable aortic calcifications. Musculoskeletal: No significant bony findings. IMPRESSION: 1. Stable extensive partially calcified mediastinal and hilar lymphadenopathy consistent with known sarcoidosis. 2. 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 3.  The superior segment left lower  lobe process has improved and is likely resolving atelectasis. Recommend follow-up noncontrast chest CT in 3-6 months 4. No worrisome pulmonary lesions or nodules. Aortic Atherosclerosis (ICD10-I70.0). Electronically Signed   By: Rudie Meyer M.D.   On: 08/23/2022 08:56    Assessment & Plan:   Problem List Items Addressed This Visit     Anxiety disorder    Reduce Seroquel XR to 100 mg at bedtime - seems to tolerate well at this dose. Helps w/racing thoughts, insomnia      Relevant Orders   Urinalysis   Cellulitis    L posterior distal thigh with abrasion and the palm size area of firm erythema, sensitive; no pus Rocephin 500 mg IM Ceftin 500 mg bid x 7 d Bandaid w/abx oint       Fall against object    ?reason. Possible effect of Seroquel XR 300 mg Reduce Seroquel XR to 100 mg at bedtime - seems to tolerate well at this dose. Helps w/racing thoughts, insomnia Check UA      Ankle sprain - Primary    S/p fall L ankle w/pain Will get an X ray      Relevant Orders   DG Ankle Complete Left (Completed)   Wrist sprain    S/p fall L wrist w/pain Will get an X ray ACE wrap      Relevant Orders   DG Wrist Complete Left (Completed)   Fall   Relevant Orders   Urinalysis      Meds ordered this encounter  Medications   QUEtiapine (SEROQUEL XR) 200 MG 24 hr tablet    Sig: Take 1/2 tab at hs   cefUROXime (CEFTIN) 500 MG tablet    Sig: Take 1 tablet (500 mg total) by mouth 2 (two) times daily with a meal for 10 days.    Dispense:  14 tablet    Refill:  0   cefTRIAXone (ROCEPHIN) injection 500 mg      Follow-up: Return in about 2 weeks (around 11/12/2022) for a follow-up visit.  Sonda Primes, MD

## 2022-10-29 NOTE — Assessment & Plan Note (Addendum)
L posterior distal thigh with abrasion and the palm size area of firm erythema, sensitive; no pus Rocephin 500 mg IM Ceftin 500 mg bid x 7 d Bandaid w/abx oint

## 2022-10-29 NOTE — Assessment & Plan Note (Signed)
Reduce Seroquel XR to 100 mg at bedtime - seems to tolerate well at this dose. Helps w/racing thoughts, insomnia

## 2022-10-29 NOTE — Assessment & Plan Note (Signed)
?  reason. Possible effect of Seroquel XR 300 mg Reduce Seroquel XR to 100 mg at bedtime - seems to tolerate well at this dose. Helps w/racing thoughts, insomnia Check UA

## 2022-10-30 ENCOUNTER — Other Ambulatory Visit: Payer: Self-pay | Admitting: Internal Medicine

## 2022-10-31 ENCOUNTER — Telehealth: Payer: Self-pay | Admitting: Internal Medicine

## 2022-10-31 DIAGNOSIS — M545 Low back pain, unspecified: Secondary | ICD-10-CM

## 2022-10-31 DIAGNOSIS — L03116 Cellulitis of left lower limb: Secondary | ICD-10-CM

## 2022-10-31 DIAGNOSIS — M150 Primary generalized (osteo)arthritis: Secondary | ICD-10-CM

## 2022-10-31 NOTE — Telephone Encounter (Signed)
Patient wants to know if she has been referred to wound care for the place on the back of her leg - she hasn't been able to change the bandage and she wants to know what to clean it with.   Please call [phone]

## 2022-11-04 ENCOUNTER — Encounter: Payer: Self-pay | Admitting: Internal Medicine

## 2022-11-04 NOTE — Telephone Encounter (Signed)
Pt has asked that Dr. Posey Rea put in the order with Home care Agency to help with the changing of her bandage as she wasn't able to do it and it was yesterday for the st tie since we placed the bandage on.

## 2022-11-04 NOTE — Telephone Encounter (Signed)
Use large Band-Aid dressing with antibiotic ointment. Clean with soapy water and pat dry. Would you like to have [place] send a home care agency nurse to check on the wound?  Thank you

## 2022-11-05 NOTE — Telephone Encounter (Signed)
Will do. Thank you

## 2022-11-07 DIAGNOSIS — J449 Chronic obstructive pulmonary disease, unspecified: Secondary | ICD-10-CM | POA: Diagnosis not present

## 2022-11-07 NOTE — Unmapped (Signed)
[facility] Refill Coordination Note    Specialty Medication(s) to be Shipped:   Inflammatory Disorders: Humira    Other medication(s) to be shipped: No additional medications requested for fill at this time     Carolyn Newton, DOB: 1960-06-01  Phone: [phone] (home)       All above HIPAA information was verified with patient.     Was a Nurse, learning disability used for this call? No    Completed refill call assessment today to schedule patient's medication shipment from the [facility]  [phone]).  All relevant notes have been reviewed.     Specialty medication(s) and dose(s) confirmed: Regimen is correct and unchanged.   Changes to medications: Delancey reports no changes at this time.  Changes to insurance: No  New side effects reported not previously addressed with a pharmacist or physician: None reported  Questions for the pharmacist: No    Confirmed patient received a Conservation officer, historic buildings and a Surveyor, mining with first shipment. The patient will receive a drug information handout for each medication shipped and additional FDA Medication Guides as required.       DISEASE/MEDICATION-SPECIFIC INFORMATION        For patients on injectable medications: Patient currently has 1 doses left.  Next injection is scheduled for next week.    SPECIALTY MEDICATION ADHERENCE     Medication Adherence    Patient reported X missed doses in the last month: 0  Specialty Medication: HUMIRA PEN CITRATE FREE 40 MG/0.4 ML  Patient is on additional specialty medications: No              Were doses missed due to medication being on hold? No    Humira 40/0.4 mg/ml: 1 doses of medicine on hand        REFERRAL TO PHARMACIST     Referral to the pharmacist: Not needed      [facility]     Shipping address confirmed in Epic.       Delivery Scheduled: Yes, Expected medication delivery date: 11/14/22.     Medication will be delivered via UPS to the prescription address in Epic WAM.    Willette Pa   [facility]  Specialty Technician

## 2022-11-10 DIAGNOSIS — Z7951 Long term (current) use of inhaled steroids: Secondary | ICD-10-CM | POA: Diagnosis not present

## 2022-11-10 DIAGNOSIS — D860 Sarcoidosis of lung: Secondary | ICD-10-CM | POA: Diagnosis not present

## 2022-11-10 DIAGNOSIS — I700 Atherosclerosis of aorta: Secondary | ICD-10-CM | POA: Diagnosis not present

## 2022-11-10 DIAGNOSIS — J9611 Chronic respiratory failure with hypoxia: Secondary | ICD-10-CM | POA: Diagnosis not present

## 2022-11-10 DIAGNOSIS — S80922D Unspecified superficial injury of left lower leg, subsequent encounter: Secondary | ICD-10-CM | POA: Diagnosis not present

## 2022-11-10 DIAGNOSIS — Z7901 Long term (current) use of anticoagulants: Secondary | ICD-10-CM | POA: Diagnosis not present

## 2022-11-10 DIAGNOSIS — G4700 Insomnia, unspecified: Secondary | ICD-10-CM | POA: Diagnosis not present

## 2022-11-10 DIAGNOSIS — E119 Type 2 diabetes mellitus without complications: Secondary | ICD-10-CM | POA: Diagnosis not present

## 2022-11-10 DIAGNOSIS — S63502D Unspecified sprain of left wrist, subsequent encounter: Secondary | ICD-10-CM | POA: Diagnosis not present

## 2022-11-10 DIAGNOSIS — Z9181 History of falling: Secondary | ICD-10-CM | POA: Diagnosis not present

## 2022-11-10 DIAGNOSIS — L03116 Cellulitis of left lower limb: Secondary | ICD-10-CM | POA: Diagnosis not present

## 2022-11-10 DIAGNOSIS — Z9981 Dependence on supplemental oxygen: Secondary | ICD-10-CM | POA: Diagnosis not present

## 2022-11-10 DIAGNOSIS — S93402D Sprain of unspecified ligament of left ankle, subsequent encounter: Secondary | ICD-10-CM | POA: Diagnosis not present

## 2022-11-10 DIAGNOSIS — L89312 Pressure ulcer of right buttock, stage 2: Secondary | ICD-10-CM | POA: Diagnosis not present

## 2022-11-10 DIAGNOSIS — Z7984 Long term (current) use of oral hypoglycemic drugs: Secondary | ICD-10-CM | POA: Diagnosis not present

## 2022-11-11 ENCOUNTER — Telehealth: Payer: Self-pay | Admitting: Internal Medicine

## 2022-11-11 ENCOUNTER — Telehealth: Payer: Self-pay

## 2022-11-11 DIAGNOSIS — R269 Unspecified abnormalities of gait and mobility: Secondary | ICD-10-CM

## 2022-11-11 NOTE — Telephone Encounter (Signed)
Error message

## 2022-11-11 NOTE — Telephone Encounter (Signed)
[name]( [name]), is calling needing approval for pt wound care for her left leg ( leg still reading after pt finished antibiotics and buttock stage 2 wound. Verbal order approved   Also stated pt want medication send in for her stage 2 wound on her buttock and a shower chair.

## 2022-11-13 DIAGNOSIS — S71102A Unspecified open wound, left thigh, initial encounter: Secondary | ICD-10-CM | POA: Diagnosis not present

## 2022-11-13 MED FILL — HUMIRA PEN CITRATE FREE 40 MG/0.4 ML: SUBCUTANEOUS | 28 days supply | Qty: 4 | Fill #1

## 2022-11-14 DIAGNOSIS — Z7951 Long term (current) use of inhaled steroids: Secondary | ICD-10-CM | POA: Diagnosis not present

## 2022-11-14 DIAGNOSIS — I700 Atherosclerosis of aorta: Secondary | ICD-10-CM | POA: Diagnosis not present

## 2022-11-14 DIAGNOSIS — L03116 Cellulitis of left lower limb: Secondary | ICD-10-CM | POA: Diagnosis not present

## 2022-11-14 DIAGNOSIS — S80922D Unspecified superficial injury of left lower leg, subsequent encounter: Secondary | ICD-10-CM | POA: Diagnosis not present

## 2022-11-14 DIAGNOSIS — S63502D Unspecified sprain of left wrist, subsequent encounter: Secondary | ICD-10-CM | POA: Diagnosis not present

## 2022-11-14 DIAGNOSIS — J9611 Chronic respiratory failure with hypoxia: Secondary | ICD-10-CM | POA: Diagnosis not present

## 2022-11-14 DIAGNOSIS — Z9181 History of falling: Secondary | ICD-10-CM | POA: Diagnosis not present

## 2022-11-14 DIAGNOSIS — G4700 Insomnia, unspecified: Secondary | ICD-10-CM | POA: Diagnosis not present

## 2022-11-14 DIAGNOSIS — Z7984 Long term (current) use of oral hypoglycemic drugs: Secondary | ICD-10-CM | POA: Diagnosis not present

## 2022-11-14 DIAGNOSIS — D860 Sarcoidosis of lung: Secondary | ICD-10-CM | POA: Diagnosis not present

## 2022-11-14 DIAGNOSIS — E119 Type 2 diabetes mellitus without complications: Secondary | ICD-10-CM | POA: Diagnosis not present

## 2022-11-14 DIAGNOSIS — Z7901 Long term (current) use of anticoagulants: Secondary | ICD-10-CM | POA: Diagnosis not present

## 2022-11-14 DIAGNOSIS — S93402D Sprain of unspecified ligament of left ankle, subsequent encounter: Secondary | ICD-10-CM | POA: Diagnosis not present

## 2022-11-14 DIAGNOSIS — L89312 Pressure ulcer of right buttock, stage 2: Secondary | ICD-10-CM | POA: Diagnosis not present

## 2022-11-14 DIAGNOSIS — Z9981 Dependence on supplemental oxygen: Secondary | ICD-10-CM | POA: Diagnosis not present

## 2022-11-15 NOTE — Telephone Encounter (Signed)
Frances Furbish( Ms. Archie Patten), is calling needing approval for pt wound care for her left leg ( leg still reading after pt finished antibiotics and buttock stage 2 wound. Verbal order approved    Also stated pt want medication send in for her stage 2 wound on her buttock and a shower chair. Shower chair order was put in.

## 2022-11-18 NOTE — Telephone Encounter (Signed)
Okay. Thank you.

## 2022-11-19 ENCOUNTER — Ambulatory Visit: Payer: 59 | Admitting: Internal Medicine

## 2022-11-19 DIAGNOSIS — Z9889 Other specified postprocedural states: Secondary | ICD-10-CM | POA: Diagnosis not present

## 2022-11-19 DIAGNOSIS — H5712 Ocular pain, left eye: Secondary | ICD-10-CM | POA: Diagnosis not present

## 2022-11-19 DIAGNOSIS — E119 Type 2 diabetes mellitus without complications: Secondary | ICD-10-CM | POA: Diagnosis not present

## 2022-11-19 DIAGNOSIS — H40013 Open angle with borderline findings, low risk, bilateral: Secondary | ICD-10-CM | POA: Diagnosis not present

## 2022-11-19 DIAGNOSIS — H2589 Other age-related cataract: Secondary | ICD-10-CM | POA: Diagnosis not present

## 2022-11-19 DIAGNOSIS — H2513 Age-related nuclear cataract, bilateral: Secondary | ICD-10-CM | POA: Diagnosis not present

## 2022-11-19 LAB — HM DIABETES EYE EXAM

## 2022-11-20 DIAGNOSIS — L89312 Pressure ulcer of right buttock, stage 2: Secondary | ICD-10-CM | POA: Diagnosis not present

## 2022-11-20 DIAGNOSIS — J9611 Chronic respiratory failure with hypoxia: Secondary | ICD-10-CM | POA: Diagnosis not present

## 2022-11-20 DIAGNOSIS — Z7951 Long term (current) use of inhaled steroids: Secondary | ICD-10-CM | POA: Diagnosis not present

## 2022-11-20 DIAGNOSIS — Z7984 Long term (current) use of oral hypoglycemic drugs: Secondary | ICD-10-CM | POA: Diagnosis not present

## 2022-11-20 DIAGNOSIS — G4700 Insomnia, unspecified: Secondary | ICD-10-CM | POA: Diagnosis not present

## 2022-11-20 DIAGNOSIS — Z9181 History of falling: Secondary | ICD-10-CM | POA: Diagnosis not present

## 2022-11-20 DIAGNOSIS — S93402D Sprain of unspecified ligament of left ankle, subsequent encounter: Secondary | ICD-10-CM | POA: Diagnosis not present

## 2022-11-20 DIAGNOSIS — L03116 Cellulitis of left lower limb: Secondary | ICD-10-CM | POA: Diagnosis not present

## 2022-11-20 DIAGNOSIS — Z7901 Long term (current) use of anticoagulants: Secondary | ICD-10-CM | POA: Diagnosis not present

## 2022-11-20 DIAGNOSIS — D860 Sarcoidosis of lung: Secondary | ICD-10-CM | POA: Diagnosis not present

## 2022-11-20 DIAGNOSIS — Z9981 Dependence on supplemental oxygen: Secondary | ICD-10-CM | POA: Diagnosis not present

## 2022-11-20 DIAGNOSIS — E119 Type 2 diabetes mellitus without complications: Secondary | ICD-10-CM | POA: Diagnosis not present

## 2022-11-20 DIAGNOSIS — S80922D Unspecified superficial injury of left lower leg, subsequent encounter: Secondary | ICD-10-CM | POA: Diagnosis not present

## 2022-11-20 DIAGNOSIS — I700 Atherosclerosis of aorta: Secondary | ICD-10-CM | POA: Diagnosis not present

## 2022-11-20 DIAGNOSIS — S63502D Unspecified sprain of left wrist, subsequent encounter: Secondary | ICD-10-CM | POA: Diagnosis not present

## 2022-11-21 DIAGNOSIS — Z9981 Dependence on supplemental oxygen: Secondary | ICD-10-CM | POA: Diagnosis not present

## 2022-11-21 DIAGNOSIS — F419 Anxiety disorder, unspecified: Secondary | ICD-10-CM

## 2022-11-21 DIAGNOSIS — Z7984 Long term (current) use of oral hypoglycemic drugs: Secondary | ICD-10-CM | POA: Diagnosis not present

## 2022-11-21 DIAGNOSIS — G4700 Insomnia, unspecified: Secondary | ICD-10-CM | POA: Diagnosis not present

## 2022-11-21 DIAGNOSIS — J9611 Chronic respiratory failure with hypoxia: Secondary | ICD-10-CM | POA: Diagnosis not present

## 2022-11-21 DIAGNOSIS — E119 Type 2 diabetes mellitus without complications: Secondary | ICD-10-CM | POA: Diagnosis not present

## 2022-11-21 DIAGNOSIS — L03116 Cellulitis of left lower limb: Secondary | ICD-10-CM | POA: Diagnosis not present

## 2022-11-21 DIAGNOSIS — Z7951 Long term (current) use of inhaled steroids: Secondary | ICD-10-CM | POA: Diagnosis not present

## 2022-11-21 DIAGNOSIS — D860 Sarcoidosis of lung: Secondary | ICD-10-CM | POA: Diagnosis not present

## 2022-11-21 DIAGNOSIS — Z7901 Long term (current) use of anticoagulants: Secondary | ICD-10-CM | POA: Diagnosis not present

## 2022-11-21 DIAGNOSIS — I700 Atherosclerosis of aorta: Secondary | ICD-10-CM | POA: Diagnosis not present

## 2022-11-21 DIAGNOSIS — L89312 Pressure ulcer of right buttock, stage 2: Secondary | ICD-10-CM | POA: Diagnosis not present

## 2022-11-26 ENCOUNTER — Telehealth: Payer: Self-pay | Admitting: Internal Medicine

## 2022-11-26 NOTE — Telephone Encounter (Signed)
Pt called stating that she is having diarrhea issues and wanted to know if Dr Macario Golds can prescribe her something for it if possible "because he have before". Please advise. Thanks

## 2022-11-27 MED ORDER — DIPHENOXYLATE-ATROPINE 2.5-0.025 MG PO TABS: 1.0000 | ORAL_TABLET | Freq: Four times a day (QID) | ORAL | 1 refills | Status: DC | PRN

## 2022-11-27 NOTE — Telephone Encounter (Signed)
Okay Lomotil.  Office visit if problems persist.

## 2022-11-28 DIAGNOSIS — Z9181 History of falling: Secondary | ICD-10-CM | POA: Diagnosis not present

## 2022-11-28 DIAGNOSIS — L03116 Cellulitis of left lower limb: Secondary | ICD-10-CM | POA: Diagnosis not present

## 2022-11-28 DIAGNOSIS — Z9981 Dependence on supplemental oxygen: Secondary | ICD-10-CM | POA: Diagnosis not present

## 2022-11-28 DIAGNOSIS — J9611 Chronic respiratory failure with hypoxia: Secondary | ICD-10-CM | POA: Diagnosis not present

## 2022-11-28 DIAGNOSIS — G4700 Insomnia, unspecified: Secondary | ICD-10-CM | POA: Diagnosis not present

## 2022-11-28 DIAGNOSIS — S80922D Unspecified superficial injury of left lower leg, subsequent encounter: Secondary | ICD-10-CM | POA: Diagnosis not present

## 2022-11-28 DIAGNOSIS — Z7901 Long term (current) use of anticoagulants: Secondary | ICD-10-CM | POA: Diagnosis not present

## 2022-11-28 DIAGNOSIS — I700 Atherosclerosis of aorta: Secondary | ICD-10-CM | POA: Diagnosis not present

## 2022-11-28 DIAGNOSIS — L89312 Pressure ulcer of right buttock, stage 2: Secondary | ICD-10-CM | POA: Diagnosis not present

## 2022-11-28 DIAGNOSIS — Z7951 Long term (current) use of inhaled steroids: Secondary | ICD-10-CM | POA: Diagnosis not present

## 2022-11-28 DIAGNOSIS — S63502D Unspecified sprain of left wrist, subsequent encounter: Secondary | ICD-10-CM | POA: Diagnosis not present

## 2022-11-28 DIAGNOSIS — Z7984 Long term (current) use of oral hypoglycemic drugs: Secondary | ICD-10-CM | POA: Diagnosis not present

## 2022-11-28 DIAGNOSIS — E119 Type 2 diabetes mellitus without complications: Secondary | ICD-10-CM | POA: Diagnosis not present

## 2022-11-28 DIAGNOSIS — S93402D Sprain of unspecified ligament of left ankle, subsequent encounter: Secondary | ICD-10-CM | POA: Diagnosis not present

## 2022-11-28 DIAGNOSIS — D860 Sarcoidosis of lung: Secondary | ICD-10-CM | POA: Diagnosis not present

## 2022-11-29 ENCOUNTER — Other Ambulatory Visit: Payer: 59

## 2022-12-02 ENCOUNTER — Ambulatory Visit (INDEPENDENT_AMBULATORY_CARE_PROVIDER_SITE_OTHER): Payer: 59 | Admitting: Internal Medicine

## 2022-12-02 ENCOUNTER — Encounter: Payer: Self-pay | Admitting: Internal Medicine

## 2022-12-02 VITALS — BP 118/82 | HR 73 | Temp 98.2°F | Ht 67.0 in | Wt 257.0 lb

## 2022-12-02 DIAGNOSIS — E538 Deficiency of other specified B group vitamins: Secondary | ICD-10-CM | POA: Diagnosis not present

## 2022-12-02 DIAGNOSIS — R21 Rash and other nonspecific skin eruption: Secondary | ICD-10-CM | POA: Diagnosis not present

## 2022-12-02 DIAGNOSIS — G894 Chronic pain syndrome: Secondary | ICD-10-CM | POA: Diagnosis not present

## 2022-12-02 DIAGNOSIS — R197 Diarrhea, unspecified: Secondary | ICD-10-CM | POA: Diagnosis not present

## 2022-12-02 DIAGNOSIS — F419 Anxiety disorder, unspecified: Secondary | ICD-10-CM | POA: Diagnosis not present

## 2022-12-02 DIAGNOSIS — R269 Unspecified abnormalities of gait and mobility: Secondary | ICD-10-CM | POA: Diagnosis not present

## 2022-12-02 DIAGNOSIS — D869 Sarcoidosis, unspecified: Secondary | ICD-10-CM | POA: Diagnosis not present

## 2022-12-02 MED ORDER — METRONIDAZOLE 500 MG PO TABS: 500.0000 mg | ORAL_TABLET | Freq: Three times a day (TID) | ORAL | 0 refills | Status: DC

## 2022-12-02 MED ORDER — DIPHENOXYLATE-ATROPINE 2.5-0.025 MG PO TABS: 1.0000 | ORAL_TABLET | Freq: Four times a day (QID) | ORAL | 1 refills | Status: DC | PRN

## 2022-12-02 MED ORDER — COLESEVELAM HCL 625 MG PO TABS: 1250.0000 mg | ORAL_TABLET | Freq: Two times a day (BID) | ORAL | 1 refills | Status: DC

## 2022-12-02 MED ORDER — PREDNISONE 10 MG PO TABS: ORAL_TABLET | ORAL | 1 refills | Status: DC

## 2022-12-02 NOTE — Progress Notes (Unsigned)
Subjective:  Patient ID: [name], female    DOB: 08-28-1960  Age: 62 y.o. MRN: [id number]  CC: Medical Management of Chronic Issues (2 mnth f/u, Pt states she is still having issues with diarrhea and is not able to eat or hold anything in and has lost a lot of weight due to this issue.Pt states she is very weak and has the shakes due to above issue as well and not eating.)   HPI [name] presents for c/o still having issues with diarrhea 6/d x 1 week and is not able to eat or hold anything in and has lost a lot of weight due to this issue. Pt states she is very weak and has the shakes due to above issue as well and not eating.  Taking Lomotil prn - not helping  Outpatient Medications Prior to Visit  Medication Sig Dispense Refill   acetaminophen (TYLENOL) 325 MG tablet Take 1-2 tablets (325-650 mg total) by mouth every 6 (six) hours as needed for mild pain (pain score 1-3 or temp > 100.5). 30 tablet 1   albuterol (PROVENTIL) (2.5 MG/3ML) 0.083% nebulizer solution Take 3 mLs (2.5 mg total) by nebulization every 6 (six) hours as needed for wheezing or shortness of breath. 75 mL 5   albuterol (VENTOLIN HFA) 108 (90 Base) MCG/ACT inhaler INHALE TWO puffs into THE lungs EVERY SIX HOURS AS NEEDED wheezind AND For SHORTNESS OF BREATH 6.7 g 3   B-D ULTRA-FINE 33 LANCETS MISC Use as instructed to test sugars once  daily 100 each 12   budesonide-formoterol (SYMBICORT) 80-4.5 MCG/ACT inhaler Inhale 2 puffs into the lungs 2 (two) times daily. INHALE TWO puffs into THE lungs TWICE DAILY 1 each 12   carvedilol (COREG) 12.5 MG tablet TAKE ONE TABLET BY MOUTH TWICE DAILY With meals 180 tablet 3   Cholecalciferol (VITAMIN D-3) 125 MCG (5000 UT) TABS Take 5,000 Units by mouth daily.     clotrimazole-betamethasone (LOTRISONE) cream Apply 1 Application topically 2 (two) times daily. 90 g 1   Cyanocobalamin (VITAMIN B-12) 1000 MCG SUBL Place 1 tablet (1,000 mcg total) under the  tongue daily. 100 tablet 3   dexlansoprazole (DEXILANT) 60 MG capsule TAKE ONE CAPSULE BY MOUTH EVERY DAY 90 capsule 1   erythromycin ophthalmic ointment as directed.     gabapentin (NEURONTIN) 300 MG capsule TAKE 1 CAPSULE BY MOUTH EVERY MORNING. MAY TAKE SECOND CAPSULE DURING THE DAY AS NEEDED FOR PAIN 180 capsule 1   glucose blood (ACCU-CHEK GUIDE) test strip Use to check blood sugar once a day. 100 each 12   HUMIRA PEN 40 MG/0.4ML PNKT Inject 40 mg as directed every Wednesday.      hydrOXYzine (ATARAX) 25 MG tablet TAKE ONE TABLET BY MOUTH EVERY EIGHT HOURS AS NEEDED 60 tablet 3   metFORMIN (GLUCOPHAGE-XR) 500 MG 24 hr tablet Take 1 tablet (500 mg total) by mouth at bedtime. 180 tablet 3   morphine (MSIR) 30 MG tablet Take 1 tablet (30 mg total) by mouth 2 (two) times daily as needed for severe pain. 60 tablet 0   moxifloxacin (VIGAMOX) 0.5 % ophthalmic solution Apply to eye as directed.     Multiple Vitamins-Minerals (MULTIVITAMIN WOMEN 50+) TABS Take 1 tablet by mouth daily.     naloxone (NARCAN) nasal spray 4 mg/0.1 mL Place 1 spray into the nose once as needed (overdose).     naltrexone (DEPADE) 50 MG tablet Take 50 mg by mouth daily.  OXYGEN Inhale 2 L into the lungs continuous.     polyvinyl alcohol (LIQUIFILM TEARS) 1.4 % ophthalmic solution Place 1 drop into both eyes as needed for dry eyes. 15 mL 0   potassium chloride (KLOR-CON M) 10 MEQ tablet TAKE ONE TABLET BY MOUTH EVERY DAY 90 tablet 1   QUEtiapine (SEROQUEL XR) 200 MG 24 hr tablet Take 1/2 tab at hs     rivaroxaban (XARELTO) 20 MG TABS tablet TAKE ONE TABLET BY MOUTH EVERY DAY 90 tablet 3   tobramycin-dexamethasone (TOBRADEX) ophthalmic solution as directed.     topiramate (TOPAMAX) 50 MG tablet TAKE ONE TABLET BY MOUTH TWICE DAILY 180 tablet 1   venlafaxine XR (EFFEXOR-XR) 75 MG 24 hr capsule TAKE ONE CAPSULE BY MOUTH EVERY DAY WITH breakfast 30 capsule 11   diphenoxylate-atropine (LOMOTIL) 2.5-0.025 MG tablet Take 1  tablet by mouth 4 (four) times daily as needed for diarrhea or loose stools. 20 tablet 1   cefdinir (OMNICEF) 300 MG capsule Take 1 capsule (300 mg total) by mouth 2 (two) times daily. 20 capsule 0   No facility-administered medications prior to visit.    ROS: Review of Systems  Constitutional:  Positive for unexpected weight change. Negative for activity change, appetite change, chills and fatigue.  HENT:  Negative for congestion, mouth sores and sinus pressure.   Eyes:  Negative for visual disturbance.  Respiratory:  Negative for cough and chest tightness.   Gastrointestinal:  Positive for diarrhea. Negative for abdominal pain, blood in stool, nausea and vomiting.  Genitourinary:  Negative for difficulty urinating, frequency and vaginal pain.  Musculoskeletal:  Positive for arthralgias, back pain and gait problem.  Skin:  Negative for pallor and rash.  Neurological:  Positive for weakness. Negative for dizziness, tremors, numbness and headaches.  Hematological:  Bruises/bleeds easily.  Psychiatric/Behavioral:  Positive for decreased concentration. Negative for confusion, sleep disturbance and suicidal ideas. The patient is nervous/anxious.     Objective:  BP 118/82 (BP Location: Right Arm, Patient Position: Sitting, Cuff Size: Normal)   Pulse 73   Temp 98.2 F (36.8 C) (Oral)   Ht 5\' 7"  (1.702 m)   Wt 257 lb (116.6 kg)   LMP 05/19/2003   SpO2 97%   BMI 40.25 kg/m   BP Readings from Last 3 Encounters:  12/02/22 118/82  10/29/22 120/68  09/30/22 120/78    Wt Readings from Last 3 Encounters:  12/02/22 257 lb (116.6 kg)  10/29/22 279 lb (126.6 kg)  09/30/22 280 lb (127 kg)    Physical Exam Constitutional:      General: She is not in acute distress.    Appearance: She is well-developed. She is obese.  HENT:     Head: Normocephalic.     Right Ear: External ear normal.     Left Ear: External ear normal.     Nose: Nose normal.  Eyes:     General:        Right eye: No  discharge.        Left eye: No discharge.     Conjunctiva/sclera: Conjunctivae normal.     Pupils: Pupils are equal, round, and reactive to light.  Neck:     Thyroid: No thyromegaly.     Vascular: No JVD.     Trachea: No tracheal deviation.  Cardiovascular:     Rate and Rhythm: Normal rate and regular rhythm.     Heart sounds: Normal heart sounds.  Pulmonary:     Effort: No respiratory distress.  Breath sounds: No stridor. No wheezing.  Abdominal:     General: Bowel sounds are normal. There is no distension.     Palpations: Abdomen is soft. There is no mass.     Tenderness: There is no abdominal tenderness. There is no guarding or rebound.  Musculoskeletal:        General: Tenderness present.     Cervical back: Normal range of motion and neck supple. No rigidity.     Right lower leg: No edema.     Left lower leg: No edema.  Lymphadenopathy:     Cervical: No cervical adenopathy.  Skin:    Findings: No erythema or rash.  Neurological:     Mental Status: She is oriented to person, place, and time.     Cranial Nerves: No cranial nerve deficit.     Motor: No weakness or abnormal muscle tone.     Coordination: Coordination normal.     Gait: Gait abnormal.     Deep Tendon Reflexes: Reflexes normal.  Psychiatric:        Behavior: Behavior normal.        Thought Content: Thought content normal.        Judgment: Judgment normal.   L poster distal thigh fatty mass 8x8 cm  Sharmen is upset    A total time of 45 minutes was spent preparing to see the patient, reviewing tests, x-rays, operative reports and other medical records.  Also, obtaining history and performing comprehensive physical exam.  Additionally, counseling the patient regarding the above listed issues.   Finally, documenting clinical information in the health records, coordination of care, educating the patient DME Rx, stress discussion, sarcoid. It is a complex case.   Lab Results  Component Value Date   WBC  7.1 12/10/2021   HGB 12.7 12/10/2021   HCT 38.3 12/10/2021   PLT 214.0 12/10/2021   GLUCOSE 104 (H) 12/10/2021   CHOL 210 (H) 07/19/2020   TRIG 109.0 07/19/2020   HDL 77.20 07/19/2020   LDLDIRECT 107.0 06/30/2017   LDLCALC 111 (H) 07/19/2020   ALT 13 12/10/2021   AST 19 12/10/2021   NA 137 12/10/2021   K 3.9 12/10/2021   CL 103 12/10/2021   CREATININE 0.94 12/10/2021   BUN 13 12/10/2021   CO2 28 12/10/2021   TSH 1.18 07/02/2021   INR 1.00 02/11/2018   HGBA1C 6.0 04/09/2022   MICROALBUR <0.7 04/09/2022    CT Chest Wo Contrast  Result Date: 08/23/2022 CLINICAL DATA:  Follow-up left lower lobe process. EXAM: CT CHEST WITHOUT CONTRAST TECHNIQUE: Multidetector CT imaging of the chest was performed following the standard protocol without IV contrast. RADIATION DOSE REDUCTION: This exam was performed according to the departmental dose-optimization program which includes automated exposure control, adjustment of the mA and/or kV according to patient size and/or use of iterative reconstruction technique. COMPARISON:  Chest CT 06/06/2022 FINDINGS: Cardiovascular: The heart is normal in size. No pericardial effusion. Stable age advanced atherosclerotic calcifications involving the aorta and coronary arteries. Mediastinum/Nodes: Stable extensive partially calcified mediastinal and hilar lymphadenopathy consistent with known sarcoidosis. The esophagus is grossly normal. 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 The superior segment left lower lobe process has improved and is likely resolving atelectasis. This area measures 3.1 x 1.2 cm and previously measured 5.0 x 2.2 cm. No worrisome pulmonary lesions or nodules.  No pleural effusions. Upper Abdomen: No significant upper abdominal findings. Stable aortic calcifications. Musculoskeletal: No significant bony findings. IMPRESSION: 1. Stable extensive partially  calcified mediastinal and hilar lymphadenopathy consistent with  known sarcoidosis. 2. 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 3. The superior segment left lower lobe process has improved and is likely resolving atelectasis. Recommend follow-up noncontrast chest CT in 3-6 months 4. No worrisome pulmonary lesions or nodules. Aortic Atherosclerosis (ICD10-I70.0). Electronically Signed   By: Rudie Meyer M.D.   On: 08/23/2022 08:56    Assessment & Plan:   Problem List Items Addressed This Visit     Sarcoidosis     On O2.  L poster distal thigh fatty mass 8x8 cm      Relevant Orders   For home use only DME 4 wheeled rolling walker with seat ([id number])   B12 deficiency    On B12      Anxiety disorder    Off  Seroquel      Obesity, Class III, BMI 40-49.9 (morbid obesity) (HCC)   Relevant Orders   For home use only DME 4 wheeled rolling walker with seat ([id number])   For home use only DME Other see comment   Rash    Prednisone 10 mg: take 4 tabs a day x 3 days; then 3 tabs a day x 4 days; then 2 tabs a day x 4 days, then 1 tab a day x 6 days, then stop. Take pc.       Diarrhea - Primary    C diff stool test Flagyl po Lomotil prn Welchol Prednisone 10 mg: take 4 tabs a day x 3 days; then 3 tabs a day x 4 days; then 2 tabs a day x 4 days, then 1 tab a day x 6 days, then stop. Take pc.       Relevant Orders   Clostridium difficile EIA   Chronic pain disorder   Relevant Medications   predniSONE (DELTASONE) 10 MG tablet   Other Relevant Orders   For home use only DME 4 wheeled rolling walker with seat ([id number])   For home use only DME Other see comment   Gait disorder   Relevant Orders   For home use only DME 4 wheeled rolling walker with seat ([id number])   For home use only DME Other see comment      Meds ordered this encounter  Medications   colesevelam (WELCHOL) 625 MG tablet    Sig: Take 2 tablets (1,250 mg total) by mouth 2 (two) times daily with a meal.    Dispense:  120 tablet    Refill:  1   DISCONTD:  metroNIDAZOLE (FLAGYL) 500 MG tablet    Sig: Take 1 tablet (500 mg total) by mouth 3 (three) times daily.    Dispense:  21 tablet    Refill:  0   predniSONE (DELTASONE) 10 MG tablet    Sig: Prednisone 10 mg: take 4 tabs a day x 3 days; then 3 tabs a day x 4 days; then 2 tabs a day x 4 days, then 1 tab a day x 6 days, then stop. Take pc.    Dispense:  38 tablet    Refill:  1   DISCONTD: metroNIDAZOLE (FLAGYL) 500 MG tablet    Sig: Take 1 tablet (500 mg total) by mouth 3 (three) times daily.    Dispense:  21 tablet    Refill:  0   diphenoxylate-atropine (LOMOTIL) 2.5-0.025 MG tablet    Sig: Take 1-2 tablets by mouth 4 (four) times daily as needed for diarrhea or loose stools.    Dispense:  60  tablet    Refill:  1   metroNIDAZOLE (FLAGYL) 500 MG tablet    Sig: Take 1 tablet (500 mg total) by mouth 3 (three) times daily.    Dispense:  21 tablet    Refill:  0      Follow-up: Return in about 6 weeks (around 01/13/2023) for a follow-up visit.  Sonda Primes, MD

## 2022-12-02 NOTE — Assessment & Plan Note (Signed)
Off  Seroquel

## 2022-12-02 NOTE — Assessment & Plan Note (Signed)
On B12 

## 2022-12-02 NOTE — Assessment & Plan Note (Addendum)
C diff stool test Flagyl po Lomotil prn Welchol Prednisone 10 mg: take 4 tabs a day x 3 days; then 3 tabs a day x 4 days; then 2 tabs a day x 4 days, then 1 tab a day x 6 days, then stop. Take pc.

## 2022-12-02 NOTE — Assessment & Plan Note (Addendum)
  On O2.  L poster distal thigh fatty mass 8x8 cm

## 2022-12-02 NOTE — Assessment & Plan Note (Signed)
Prednisone 10 mg: take 4 tabs a day x 3 days; then 3 tabs a day x 4 days; then 2 tabs a day x 4 days, then 1 tab a day x 6 days, then stop. Take pc.

## 2022-12-03 NOTE — Unmapped (Signed)
[facility] Refill Coordination Note    Specialty Medication(s) to be Shipped:   Inflammatory Disorders: Humira    Other medication(s) to be shipped: No additional medications requested for fill at this time     Carolyn Newton, DOB: Jun 13, 1960  Phone: [phone] (home)       All above HIPAA information was verified with patient.     Was a Nurse, learning disability used for this call? No    Completed refill call assessment today to schedule patient's medication shipment from the [facility]  [phone]).  All relevant notes have been reviewed.     Specialty medication(s) and dose(s) confirmed: Regimen is correct and unchanged.   Changes to medications: Carolyn Newton reports no changes at this time.  Changes to insurance: No  New side effects reported not previously addressed with a pharmacist or physician: None reported  Questions for the pharmacist: No    Confirmed patient received a Conservation officer, historic buildings and a Surveyor, mining with first shipment. The patient will receive a drug information handout for each medication shipped and additional FDA Medication Guides as required.       DISEASE/MEDICATION-SPECIFIC INFORMATION        For patients on injectable medications: Patient currently has 2 doses left.  Next injection is scheduled for 11/20.    SPECIALTY MEDICATION ADHERENCE     Medication Adherence    Patient reported X missed doses in the last month: 0  Specialty Medication: HUMIRA PEN CITRATE FREE 40 MG/0.4 ML  Patient is on additional specialty medications: No  Informant: patient              Were doses missed due to medication being on hold? No    Humira 40/0.4 mg/ml: 2 doses of medicine on hand        REFERRAL TO PHARMACIST     Referral to the pharmacist: Not needed      [facility]     Shipping address confirmed in Epic.       Delivery Scheduled: Yes, Expected medication delivery date: 12/06/22.     Medication will be delivered via UPS to the prescription address in Epic WAM.    [facility]yn Newton [facility]

## 2022-12-04 ENCOUNTER — Telehealth: Payer: Self-pay | Admitting: Internal Medicine

## 2022-12-04 DIAGNOSIS — G4700 Insomnia, unspecified: Secondary | ICD-10-CM | POA: Diagnosis not present

## 2022-12-04 DIAGNOSIS — L03116 Cellulitis of left lower limb: Secondary | ICD-10-CM | POA: Diagnosis not present

## 2022-12-04 DIAGNOSIS — J9611 Chronic respiratory failure with hypoxia: Secondary | ICD-10-CM | POA: Diagnosis not present

## 2022-12-04 DIAGNOSIS — Z7984 Long term (current) use of oral hypoglycemic drugs: Secondary | ICD-10-CM | POA: Diagnosis not present

## 2022-12-04 DIAGNOSIS — L89312 Pressure ulcer of right buttock, stage 2: Secondary | ICD-10-CM | POA: Diagnosis not present

## 2022-12-04 DIAGNOSIS — Z7951 Long term (current) use of inhaled steroids: Secondary | ICD-10-CM | POA: Diagnosis not present

## 2022-12-04 DIAGNOSIS — D860 Sarcoidosis of lung: Secondary | ICD-10-CM | POA: Diagnosis not present

## 2022-12-04 DIAGNOSIS — E119 Type 2 diabetes mellitus without complications: Secondary | ICD-10-CM | POA: Diagnosis not present

## 2022-12-04 DIAGNOSIS — Z7901 Long term (current) use of anticoagulants: Secondary | ICD-10-CM | POA: Diagnosis not present

## 2022-12-04 DIAGNOSIS — S63502D Unspecified sprain of left wrist, subsequent encounter: Secondary | ICD-10-CM | POA: Diagnosis not present

## 2022-12-04 DIAGNOSIS — Z9981 Dependence on supplemental oxygen: Secondary | ICD-10-CM | POA: Diagnosis not present

## 2022-12-04 DIAGNOSIS — S93402D Sprain of unspecified ligament of left ankle, subsequent encounter: Secondary | ICD-10-CM | POA: Diagnosis not present

## 2022-12-04 DIAGNOSIS — I700 Atherosclerosis of aorta: Secondary | ICD-10-CM | POA: Diagnosis not present

## 2022-12-04 DIAGNOSIS — S80922D Unspecified superficial injury of left lower leg, subsequent encounter: Secondary | ICD-10-CM | POA: Diagnosis not present

## 2022-12-04 DIAGNOSIS — Z9181 History of falling: Secondary | ICD-10-CM | POA: Diagnosis not present

## 2022-12-04 NOTE — Telephone Encounter (Signed)
Patient saw Dr. Posey Rea on 12/02/2022 for her chronic diarrhea. She said she was told he would send in a different prescription to help, but the same medication was sent in. She said that medication has not been helping. She said she is weak and has lost a lot of weight. Patient would like a call back at [phone].

## 2022-12-05 ENCOUNTER — Encounter: Payer: Self-pay | Admitting: Internal Medicine

## 2022-12-05 MED FILL — HUMIRA PEN CITRATE FREE 40 MG/0.4 ML: SUBCUTANEOUS | 28 days supply | Qty: 4 | Fill #2

## 2022-12-05 NOTE — Telephone Encounter (Signed)
Patient called back to check on the status of her request. She would like a call back at [phone].

## 2022-12-08 NOTE — Telephone Encounter (Signed)
Metronidazole, WelChol and prednisone were sent to [facility] pharmacy on the day of the last office visit.  Thank you

## 2022-12-09 ENCOUNTER — Telehealth: Payer: Self-pay | Admitting: Internal Medicine

## 2022-12-09 NOTE — Telephone Encounter (Signed)
Patient is still having diarrhea - was seen last week.  Please call patient and advise - [phone]

## 2022-12-10 ENCOUNTER — Other Ambulatory Visit: Payer: Self-pay | Admitting: Internal Medicine

## 2022-12-10 NOTE — Telephone Encounter (Signed)
Pt is still having diarrhea and has been taking the newly rx'd meds as prescribed.  Pt was not able to stop at he lab to Pick up stool sample kit.

## 2022-12-11 NOTE — Telephone Encounter (Signed)
She is taking WelChol 2 tablets twice a day?  Is she taking Flagy (metronidazole) ?  Thanks

## 2022-12-16 ENCOUNTER — Ambulatory Visit: Payer: 59 | Admitting: Podiatry

## 2022-12-19 ENCOUNTER — Other Ambulatory Visit: Payer: Self-pay | Admitting: Internal Medicine

## 2022-12-23 ENCOUNTER — Other Ambulatory Visit: Payer: Self-pay | Admitting: Internal Medicine

## 2022-12-23 MED ORDER — NALTREXONE 50 MG TABLET
ORAL_TABLET | 11 refills | 0.00 days
Start: 2022-12-23 — End: ?

## 2022-12-24 MED ORDER — NALTREXONE 50 MG TABLET
ORAL_TABLET | 11 refills | 0.00 days
Start: 2022-12-24 — End: ?

## 2022-12-24 NOTE — Unmapped (Signed)
Unsafe in setting of opioid pain treatment

## 2022-12-30 ENCOUNTER — Other Ambulatory Visit: Payer: Self-pay | Admitting: Internal Medicine

## 2023-01-01 NOTE — Telephone Encounter (Signed)
Results abstracted and Care Team updated

## 2023-01-03 NOTE — Unmapped (Signed)
[facility] Pharm[facility] Refill Coordination Note    Specialty Medication(s) to be Shipped:   Inflammatory Disorders: Humira    Other medication(s) to be shipped: No additional medications requested for fill at this time     Carolyn Newton, DOB: 04-06-1960  Phone: [phone] (home)       All above HIPAA information was verified with patient.     Was a Nurse, learning disability used for this call? No    Completed refill call assessment today to schedule patient's medication shipment from the [facility] and [facility] Delivery Pharm[facility]  [phone]).  All relevant notes have been reviewed.     Specialty medication(s) and dose(s) confirmed: Regimen is correct and unchanged.   Changes to medications: Tabrina reports no changes at this time.  Changes to insurance: No  New side effects reported not previously addressed with a pharmacist or physician: None reported  Questions for the pharmacist: No    Confirmed patient received a Conservation officer, historic buildings and a Surveyor, mining with first shipment. The patient will receive a drug information handout for each medication shipped and additional FDA Medication Guides as required.       DISEASE/MEDICATION-SPECIFIC INFORMATION        For patients on injectable medications: Patient currently has 1 doses left.  Next injection is scheduled for 12/25.    SPECIALTY MEDICATION ADHERENCE     Medication Adherence    Patient reported X missed doses in the last month: 1  Specialty Medication: HUMIRA PEN CITRATE FREE 40 MG/0.4 ML  Patient is on additional specialty medications: No  Informant: patient              Were doses missed due to medication being on hold? No    Humira 40/0.4 mg/ml: 1 doses of medicine on hand        REFERRAL TO PHARMACIST     Referral to the pharmacist: Not needed      [facility]     Shipping address confirmed in Epic.       Delivery Scheduled: Yes, Expected medication delivery date: 01/10/23.     Medication will be delivered via UPS to the prescription address in Epic WAM.    [name]

## 2023-01-06 ENCOUNTER — Ambulatory Visit: Payer: 59 | Admitting: Physician Assistant

## 2023-01-07 ENCOUNTER — Other Ambulatory Visit: Payer: Self-pay | Admitting: Internal Medicine

## 2023-01-09 MED FILL — HUMIRA PEN CITRATE FREE 40 MG/0.4 ML: SUBCUTANEOUS | 28 days supply | Qty: 4 | Fill #3

## 2023-01-13 ENCOUNTER — Ambulatory Visit: Payer: 59 | Admitting: Internal Medicine

## 2023-01-20 ENCOUNTER — Other Ambulatory Visit: Payer: Self-pay | Admitting: Medical Genetics

## 2023-01-21 ENCOUNTER — Other Ambulatory Visit: Payer: Self-pay | Admitting: Internal Medicine

## 2023-01-21 DIAGNOSIS — Z0000 Encounter for general adult medical examination without abnormal findings: Secondary | ICD-10-CM

## 2023-01-23 ENCOUNTER — Encounter: Payer: Self-pay | Admitting: Internal Medicine

## 2023-01-23 ENCOUNTER — Other Ambulatory Visit: Payer: Self-pay | Admitting: Internal Medicine

## 2023-01-23 ENCOUNTER — Ambulatory Visit (INDEPENDENT_AMBULATORY_CARE_PROVIDER_SITE_OTHER): Payer: 59 | Admitting: Internal Medicine

## 2023-01-23 VITALS — BP 110/80 | HR 67 | Temp 97.3°F | Ht 67.0 in | Wt 243.0 lb

## 2023-01-23 DIAGNOSIS — N182 Chronic kidney disease, stage 2 (mild): Secondary | ICD-10-CM

## 2023-01-23 DIAGNOSIS — D869 Sarcoidosis, unspecified: Secondary | ICD-10-CM | POA: Diagnosis not present

## 2023-01-23 DIAGNOSIS — E1122 Type 2 diabetes mellitus with diabetic chronic kidney disease: Secondary | ICD-10-CM

## 2023-01-23 DIAGNOSIS — I10 Essential (primary) hypertension: Secondary | ICD-10-CM | POA: Diagnosis not present

## 2023-01-23 DIAGNOSIS — R197 Diarrhea, unspecified: Secondary | ICD-10-CM

## 2023-01-23 DIAGNOSIS — R21 Rash and other nonspecific skin eruption: Secondary | ICD-10-CM

## 2023-01-23 DIAGNOSIS — Z7984 Long term (current) use of oral hypoglycemic drugs: Secondary | ICD-10-CM | POA: Diagnosis not present

## 2023-01-23 DIAGNOSIS — F339 Major depressive disorder, recurrent, unspecified: Secondary | ICD-10-CM

## 2023-01-23 MED ORDER — BUDESONIDE ER 9 MG PO TB24: 1.0000 | ORAL_TABLET | Freq: Every morning | ORAL | 5 refills | Status: DC

## 2023-01-23 MED ORDER — MUPIROCIN 2 % EX OINT: TOPICAL_OINTMENT | CUTANEOUS | 0 refills | Status: DC

## 2023-01-23 MED ORDER — GABAPENTIN 300 MG PO CAPS: ORAL_CAPSULE | ORAL | 1 refills | Status: DC

## 2023-01-23 MED ORDER — ERYTHROMYCIN 5 MG/GM OP OINT: TOPICAL_OINTMENT | Freq: Every day | OPHTHALMIC | 2 refills | Status: DC

## 2023-01-23 MED ORDER — SULFAMETHOXAZOLE-TRIMETHOPRIM 800-160 MG PO TABS: 1.0000 | ORAL_TABLET | Freq: Two times a day (BID) | ORAL | 0 refills | Status: DC

## 2023-01-23 NOTE — Assessment & Plan Note (Signed)
It may get worse on steroids. Take extra Metformin (3 per day)

## 2023-01-23 NOTE — Assessment & Plan Note (Addendum)
 Probably infectious-?skin Staph on face, nose Bactrim DS x 10 d Bactroban ointment 3 times daily

## 2023-01-23 NOTE — Patient Instructions (Signed)
 [name]

## 2023-01-23 NOTE — Assessment & Plan Note (Signed)
Cont on Coreg, Lasix

## 2023-01-23 NOTE — Progress Notes (Signed)
 Subjective:  Patient ID: [name] Mechanic, female    DOB: 12/06/1960  Age: 63 y.o. MRN: [id number]  CC: Diarrhea (6 week follow up regarding unspecified diarrhea. Patient stated symptoms have not subsided. Discuss colesevelam  script. Send refill of gabapentin  out to [facility] as patient does not have the transportation to get to the [facility] pharmacy anymore) and Rash (Also experiencing rash on their face around their left nostril, across their left cheek for the last couple of weeks.)   HPI [name] presents for diarrhea - not better C/o rash on face - L nostril  Outpatient Medications Prior to Visit  Medication Sig Dispense Refill   acetaminophen  (TYLENOL ) 325 MG tablet Take 1-2 tablets (325-650 mg total) by mouth every 6 (six) hours as needed for mild pain (pain score 1-3 or temp > 100.5). 30 tablet 1   albuterol  (PROVENTIL ) (2.5 MG/3ML) 0.083% nebulizer solution Take 3 mLs (2.5 mg total) by nebulization every 6 (six) hours as needed for wheezing or shortness of breath. 75 mL 5   albuterol  (VENTOLIN  HFA) 108 (90 Base) MCG/ACT inhaler INHALE TWO puffs into THE lungs EVERY SIX HOURS AS NEEDED wheezind AND For SHORTNESS OF BREATH 6.7 g 3   B-D ULTRA-FINE 33 LANCETS MISC Use as instructed to test sugars once  daily 100 each 12   budesonide -formoterol  (SYMBICORT ) 80-4.5 MCG/ACT inhaler Inhale 2 puffs into the lungs 2 (two) times daily. INHALE TWO puffs into THE lungs TWICE DAILY 1 each 12   carvedilol  (COREG ) 12.5 MG tablet TAKE ONE TABLET BY MOUTH TWICE DAILY With meals 180 tablet 3   Cholecalciferol  (VITAMIN D -3) 125 MCG (5000 UT) TABS Take 5,000 Units by mouth daily.     clotrimazole -betamethasone  (LOTRISONE ) cream Apply 1 Application topically 2 (two) times daily. 90 g 1   Cyanocobalamin  (VITAMIN B-12) 1000 MCG SUBL Place 1 tablet (1,000 mcg total) under the tongue daily. 100 tablet 3   dexlansoprazole  (DEXILANT ) 60 MG capsule TAKE ONE CAPSULE BY MOUTH EVERY  DAY 90 capsule 1   diphenoxylate -atropine  (LOMOTIL ) 2.5-0.025 MG tablet Take 1-2 tablets by mouth 4 (four) times daily as needed for diarrhea or loose stools. 60 tablet 1   glucose blood (ACCU-CHEK GUIDE) test strip Use to check blood sugar once a day. 100 each 12   HUMIRA PEN 40 MG/0.4ML PNKT Inject 40 mg as directed every Wednesday.      hydrOXYzine  (ATARAX ) 25 MG tablet TAKE ONE TABLET BY MOUTH EVERY EIGHT HOURS AS NEEDED 60 tablet 3   metFORMIN  (GLUCOPHAGE -XR) 500 MG 24 hr tablet Take 1 tablet (500 mg total) by mouth at bedtime. 180 tablet 3   metroNIDAZOLE  (FLAGYL ) 500 MG tablet Take 1 tablet (500 mg total) by mouth 3 (three) times daily. 21 tablet 0   morphine  (MSIR) 30 MG tablet Take 1 tablet (30 mg total) by mouth 2 (two) times daily as needed for severe pain. 60 tablet 0   moxifloxacin  (VIGAMOX ) 0.5 % ophthalmic solution Apply to eye as directed.     Multiple Vitamins-Minerals (MULTIVITAMIN WOMEN 50+) TABS Take 1 tablet by mouth daily.     naloxone  (NARCAN ) nasal spray 4 mg/0.1 mL Place 1 spray into the nose once as needed (overdose).     naltrexone (DEPADE) 50 MG tablet Take 50 mg by mouth daily.     OXYGEN  Inhale 2 L into the lungs continuous.     polyvinyl alcohol  (LIQUIFILM TEARS) 1.4 % ophthalmic solution Place 1 drop into both eyes as needed for dry  eyes. 15 mL 0   potassium chloride  (KLOR-CON  M) 10 MEQ tablet TAKE ONE TABLET BY MOUTH EVERY DAY 90 tablet 1   QUEtiapine  (SEROQUEL  XR) 200 MG 24 hr tablet Take 1/2 tab at hs     rivaroxaban  (XARELTO ) 20 MG TABS tablet TAKE ONE TABLET BY MOUTH EVERY DAY 90 tablet 3   tobramycin -dexamethasone  (TOBRADEX ) ophthalmic solution as directed.     topiramate  (TOPAMAX ) 50 MG tablet TAKE ONE TABLET BY MOUTH TWICE DAILY 180 tablet 1   venlafaxine  XR (EFFEXOR -XR) 75 MG 24 hr capsule TAKE ONE CAPSULE BY MOUTH EVERY DAY WITH breakfast 30 capsule 11   erythromycin  ophthalmic ointment as directed.     gabapentin  (NEURONTIN ) 300 MG capsule TAKE 1  CAPSULE BY MOUTH EVERY MORNING. MAY TAKE SECOND CAPSULE DURING THE DAY AS NEEDED FOR PAIN 180 capsule 1   predniSONE  (DELTASONE ) 10 MG tablet Prednisone  10 mg: take 4 tabs a day x 3 days; then 3 tabs a day x 4 days; then 2 tabs a day x 4 days, then 1 tab a day x 6 days, then stop. Take pc. 38 tablet 1   colesevelam  (WELCHOL ) 625 MG tablet Take 2 tablets (1,250 mg total) by mouth 2 (two) times daily with a meal. (Patient not taking: Reported on 01/23/2023) 120 tablet 1   No facility-administered medications prior to visit.    ROS: Review of Systems  Constitutional:  Positive for fatigue. Negative for activity change, appetite change, chills and unexpected weight change.  HENT:  Positive for postnasal drip. Negative for congestion, mouth sores and sinus pressure.   Eyes:  Negative for visual disturbance.  Respiratory:  Negative for cough and chest tightness.   Gastrointestinal:  Positive for diarrhea. Negative for abdominal pain and nausea.  Genitourinary:  Negative for difficulty urinating, frequency and vaginal pain.  Musculoskeletal:  Positive for back pain. Negative for gait problem.  Skin:  Positive for rash. Negative for pallor.  Neurological:  Negative for dizziness, tremors, weakness, numbness and headaches.  Hematological:  Bruises/bleeds easily.  Psychiatric/Behavioral:  Positive for decreased concentration. Negative for confusion, sleep disturbance and suicidal ideas. The patient is not nervous/anxious.     Objective:  BP 110/80   Pulse 67   Temp (!) 97.3 F (36.3 C)   Ht 5' 7 (1.702 m)   Wt 243 lb (110.2 kg)   LMP 05/19/2003   SpO2 97%   BMI 38.06 kg/m   BP Readings from Last 3 Encounters:  01/23/23 110/80  12/02/22 118/82  10/29/22 120/68    Wt Readings from Last 3 Encounters:  01/23/23 243 lb (110.2 kg)  12/02/22 257 lb (116.6 kg)  10/29/22 279 lb (126.6 kg)    Physical Exam Constitutional:      General: She is not in acute distress.    Appearance: She is  well-developed. She is obese.  HENT:     Head: Normocephalic.     Right Ear: External ear normal.     Left Ear: External ear normal.     Nose: Nose normal.  Eyes:     General:        Right eye: No discharge.        Left eye: No discharge.     Conjunctiva/sclera: Conjunctivae normal.     Pupils: Pupils are equal, round, and reactive to light.  Neck:     Thyroid : No thyromegaly.     Vascular: No JVD.     Trachea: No tracheal deviation.  Cardiovascular:  Rate and Rhythm: Normal rate and regular rhythm.     Heart sounds: Normal heart sounds.  Pulmonary:     Effort: No respiratory distress.     Breath sounds: No stridor. No wheezing.  Abdominal:     General: Bowel sounds are normal. There is no distension.     Palpations: Abdomen is soft. There is no mass.     Tenderness: There is no abdominal tenderness. There is no guarding or rebound.  Musculoskeletal:        General: Tenderness present.     Cervical back: Normal range of motion and neck supple. No rigidity.     Right lower leg: No edema.     Left lower leg: No edema.  Lymphadenopathy:     Cervical: No cervical adenopathy.  Skin:    Findings: Erythema and rash present. No lesion.  Neurological:     Mental Status: She is oriented to person, place, and time.     Cranial Nerves: No cranial nerve deficit.     Motor: No abnormal muscle tone.     Coordination: Coordination normal.     Gait: Gait abnormal.     Deep Tendon Reflexes: Reflexes normal.  Psychiatric:        Behavior: Behavior normal.        Thought Content: Thought content normal.        Judgment: Judgment normal.   Dry eyelids Erythema and rash L cheek and B nostrils Using a walker On oxygen   Lab Results  Component Value Date   WBC 7.1 12/10/2021   HGB 12.7 12/10/2021   HCT 38.3 12/10/2021   PLT 214.0 12/10/2021   GLUCOSE 104 (H) 12/10/2021   CHOL 210 (H) 07/19/2020   TRIG 109.0 07/19/2020   HDL 77.20 07/19/2020   LDLDIRECT 107.0 06/30/2017    LDLCALC 111 (H) 07/19/2020   ALT 13 12/10/2021   AST 19 12/10/2021   NA 137 12/10/2021   K 3.9 12/10/2021   CL 103 12/10/2021   CREATININE 0.94 12/10/2021   BUN 13 12/10/2021   CO2 28 12/10/2021   TSH 1.18 07/02/2021   INR 1.00 02/11/2018   HGBA1C 6.0 04/09/2022   MICROALBUR <0.7 04/09/2022    CT Chest Wo Contrast Result Date: 08/23/2022 CLINICAL DATA:  Follow-up left lower lobe process. EXAM: CT CHEST WITHOUT CONTRAST TECHNIQUE: Multidetector CT imaging of the chest was performed following the standard protocol without IV contrast. RADIATION DOSE REDUCTION: This exam was performed according to the departmental dose-optimization program which includes automated exposure control, adjustment of the mA and/or kV according to patient size and/or use of iterative reconstruction technique. COMPARISON:  Chest CT 06/06/2022 FINDINGS: Cardiovascular: The heart is normal in size. No pericardial effusion. Stable age advanced atherosclerotic calcifications involving the aorta and coronary arteries. Mediastinum/Nodes: Stable extensive partially calcified mediastinal and hilar lymphadenopathy consistent with known sarcoidosis. The esophagus is grossly normal. 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 The superior segment left lower lobe process has improved and is likely resolving atelectasis. This area measures 3.1 x 1.2 cm and previously measured 5.0 x 2.2 cm. No worrisome pulmonary lesions or nodules.  No pleural effusions. Upper Abdomen: No significant upper abdominal findings. Stable aortic calcifications. Musculoskeletal: No significant bony findings. IMPRESSION: 1. Stable extensive partially calcified mediastinal and hilar lymphadenopathy consistent with known sarcoidosis. 2. 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 3. The superior segment left lower lobe process has improved and is likely resolving atelectasis. Recommend follow-up noncontrast chest CT  in  3-6 months 4. No worrisome pulmonary lesions or nodules. Aortic Atherosclerosis (ICD10-I70.0). Electronically Signed   By: MYRTIS Stammer M.D.   On: 08/23/2022 08:56    Assessment & Plan:   Problem List Items Addressed This Visit     Sarcoidosis    On O2.        Major depressive disorder, recurrent episode (HCC)   On Seroquel .   Potential benefits of a long term Seroquel  use as well as potential risks and complications (i.e. somnolence, constipation and others) were explained to the patient and were aknowledged.       Essential hypertension   Cont on Coreg , Lasix       Rash - Primary   Probably infectious-?skin Staph on face, nose Bactrim  DS x 10 d Bactroban  ointment 3 times daily       Controlled type 2 diabetes mellitus with chronic kidney disease, without long-term current use of insulin  (HCC)   It may get worse on steroids. Take extra Metformin  (3 per day)      Relevant Orders   Ambulatory referral to Endocrinology   Diarrhea   Persistent.  Alleigh has not started WelChol  yet.  She will try WelChol .  If in addition, start budesonide  ER 9 mg daily Keep return office visit         Meds ordered this encounter  Medications   gabapentin  (NEURONTIN ) 300 MG capsule    Sig: TAKE 1 CAPSULE BY MOUTH EVERY MORNING. MAY TAKE SECOND CAPSULE DURING THE DAY AS NEEDED FOR PAIN    Dispense:  180 capsule    Refill:  1   mupirocin  ointment (BACTROBAN ) 2 %    Sig: Use tid- qid    Dispense:  30 g    Refill:  0   sulfamethoxazole -trimethoprim  (BACTRIM  DS) 800-160 MG tablet    Sig: Take 1 tablet by mouth 2 (two) times daily.    Dispense:  20 tablet    Refill:  0   Budesonide  ER 9 MG TB24    Sig: Take 1 tablet (9 mg total) by mouth in the morning.    Dispense:  30 tablet    Refill:  5   erythromycin  ophthalmic ointment    Sig: Place into both eyes at bedtime.    Dispense:  3.5 g    Refill:  2      Follow-up: Return in about 3 months (around 04/23/2023) for a follow-up  visit.  Marolyn Noel, MD

## 2023-01-25 LAB — CLOSTRIDIUM DIFFICILE EIA: C difficile Toxins A+B, EIA: NEGATIVE

## 2023-01-25 LAB — SPECIMEN STATUS REPORT

## 2023-01-27 ENCOUNTER — Telehealth: Payer: Self-pay | Admitting: Pulmonary Disease

## 2023-01-27 ENCOUNTER — Encounter: Payer: Self-pay | Admitting: Internal Medicine

## 2023-01-27 DIAGNOSIS — J441 Chronic obstructive pulmonary disease with (acute) exacerbation: Secondary | ICD-10-CM

## 2023-01-27 DIAGNOSIS — J9611 Chronic respiratory failure with hypoxia: Secondary | ICD-10-CM

## 2023-01-27 NOTE — Telephone Encounter (Signed)
 An order will have to be placed for whatever is needed

## 2023-01-27 NOTE — Telephone Encounter (Signed)
 Patient needs supplies for her oxygen machine, tubing and concentrator. Last seen 08/2022. [employer] is her supplier. For more information she can be reached at [phone]

## 2023-01-27 NOTE — Assessment & Plan Note (Signed)
 On Seroquel.   Potential benefits of a long term Seroquel use as well as potential risks and complications (i.e. somnolence, constipation and others) were explained to the patient and were aknowledged.

## 2023-01-27 NOTE — Assessment & Plan Note (Signed)
On O2 

## 2023-01-27 NOTE — Assessment & Plan Note (Signed)
 Persistent.  [name] has not started [employer] yet.  She will try WelChol.  If in addition, start budesonide ER 9 mg daily Keep return office visit

## 2023-01-29 ENCOUNTER — Telehealth: Payer: Self-pay | Admitting: Internal Medicine

## 2023-01-29 ENCOUNTER — Encounter: Payer: Self-pay | Admitting: Gastroenterology

## 2023-01-29 NOTE — Telephone Encounter (Signed)
 Inbound call from patient stating that she needed to make an appointment. Patient states that she has been having diarrhea since November 12th and has lost 39 pounds since then. Patient is worried because her brother passed away 2 years ago from colon cancer. Patient was scheduled for an appointment with Jessica Zehr on 2/6 at 11:30 and is requesting a call from the nurse to discuss. Please advise.

## 2023-01-29 NOTE — Telephone Encounter (Signed)
 Pt states she has been having diarrhea for months and has lost a lot of weight. She saw her PCP and was prescribed lomotil  and Welchol . She reports they have helped a little but she is still having diarrhea. Reports a significant wt loss and is worried as her brother had colon cancer. Pt scheduled to see Loa Riling PA 02/05/23 at 1:30pm. Pt aware of appt.

## 2023-01-29 NOTE — Telephone Encounter (Signed)
 Patient checking on order for oxygen  supplies. Patient phone number is [phone].

## 2023-01-30 NOTE — Telephone Encounter (Signed)
Patient states that [employer] has not received the order for her oxygen supplies. She is in need of new supplies since she is out of one in particular for her nose piece.

## 2023-01-31 NOTE — Unmapped (Signed)
[facility] Refill Coordination Note    Specialty Medication(s) to be Shipped:   Inflammatory Disorders: Humira    Other medication(s) to be shipped: No additional medications requested for fill at this time     Carolyn Newton, DOB: 05/03/1960  Phone: [phone] (home)       All above HIPAA information was verified with patient.     Was a Nurse, learning disability used for this call? No    Completed refill call assessment today to schedule patient's medication shipment from the [facility]  [phone]).  All relevant notes have been reviewed.     Specialty medication(s) and dose(s) confirmed: Regimen is correct and unchanged.   Changes to medications: Deina reports no changes at this time.  Changes to insurance: No  New side effects reported not previously addressed with a pharmacist or physician: Yes - Patient reports rash on hands. Patient would not like to speak to the pharmacist today. Their provider is not aware.  Questions for the pharmacist: No    Confirmed patient received a Conservation officer, historic buildings and a Surveyor, mining with first shipment. The patient will receive a drug information handout for each medication shipped and additional FDA Medication Guides as required.       DISEASE/MEDICATION-SPECIFIC INFORMATION        For patients on injectable medications: Patient currently has 1 doses left.  Next injection is scheduled for 1/22.    SPECIALTY MEDICATION ADHERENCE     Medication Adherence    Patient reported X missed doses in the last month: 0  Specialty Medication: HUMIRA PEN CITRATE FREE 40 MG/0.4 ML  Patient is on additional specialty medications: No  Informant: patient              Were doses missed due to medication being on hold? No    Humira 40/0.4 mg/ml: 1 doses of medicine on hand        REFERRAL TO PHARMACIST     Referral to the pharmacist: Yes - new side effects are reported: rash on hands. Refills were scheduled and concern routed (high priority) to pharmacist for evaluation.      SHIPPING     Shipping address confirmed in Epic.       Delivery Scheduled: Yes, Expected medication delivery date: 02/06/22.     Medication will be delivered via UPS to the prescription address in Epic WAM.    [name]

## 2023-02-02 DIAGNOSIS — J449 Chronic obstructive pulmonary disease, unspecified: Secondary | ICD-10-CM | POA: Diagnosis not present

## 2023-02-04 NOTE — Telephone Encounter (Signed)
[facility] please check with [employer] to find out what is needed. Patient states they are threatening to come pick-up her [name]. They told her to have provider office call them ASAP.

## 2023-02-04 NOTE — Telephone Encounter (Signed)
PT still waiting for answer on her 02 as noted below. She called [employer] and they told her her 14 was just temporary and the Dr. Eulah Citizen have to issue another RX or they would terminate her 02 and come pick it up. PT is very concerned. She needs her 02. Please call to advise ASAP. [phone]  (Initial req in since 1/13. Fwd to Ms. Parrett)   Also, has requested 1/13 that she  needs tubing as well. She usually gets two 25' green tubes and two 7 FT cannulas  2- 4'  cannulas for her POC a month, she said.

## 2023-02-05 ENCOUNTER — Ambulatory Visit: Payer: 59 | Admitting: Gastroenterology

## 2023-02-05 NOTE — Telephone Encounter (Signed)
Last in office visit 04/2022. Dr. Vassie Loll  did video visit 08/2022. Which he said continue on O2 . Can we please check with DME/patient to see what is needed

## 2023-02-05 NOTE — Telephone Encounter (Signed)
I have sent community msg to [place] and Elease Hashimoto with [employer] to see what is needed  Will await their response

## 2023-02-06 MED FILL — HUMIRA PEN CITRATE FREE 40 MG/0.4 ML: SUBCUTANEOUS | 28 days supply | Qty: 4 | Fill #4

## 2023-02-07 NOTE — Telephone Encounter (Signed)
Per [employer]:  Ellis Parents, CMA; Santina Evans; Kathyrn Sheriff Sorry, I mean she needs to recertified again.    I called and spoke with the pt and notified her o2 recert is needed  I have moved up her appt for this to be done  Nothing further needed

## 2023-02-11 ENCOUNTER — Other Ambulatory Visit: Payer: Self-pay | Admitting: Internal Medicine

## 2023-02-18 ENCOUNTER — Other Ambulatory Visit: Payer: Self-pay | Admitting: Internal Medicine

## 2023-02-20 ENCOUNTER — Ambulatory Visit: Payer: 59 | Admitting: Gastroenterology

## 2023-02-21 ENCOUNTER — Other Ambulatory Visit: Payer: Self-pay | Admitting: Internal Medicine

## 2023-02-21 ENCOUNTER — Other Ambulatory Visit (HOSPITAL_COMMUNITY): Payer: Self-pay

## 2023-02-21 ENCOUNTER — Telehealth: Payer: Self-pay

## 2023-02-21 NOTE — Unmapped (Signed)
 This pharmacist was notified by a technician that this patient has reported they are experiencing the following side effects hand dermatitis.. I have reviewed the patient's medical record and  will move next call to clinical. It appears patient reported similar symptoms at last visit with clinic and was encouraged to use topical steroids. Will check to see if this is improving or if patient needs to schedule further followup with office.      Raul Torrance A. Katrinka Blazing, PharmD, BCPS - Clinical Pharmacist   [facility]    [address], [place], [place] [address]  t [phone], opt 4 then 2 - f [phone]

## 2023-02-21 NOTE — Telephone Encounter (Signed)
 Pharmacy Patient Advocate Encounter   Received notification from  [facility] Portal that prior authorization for Budesonide  ER 9MG  er tablets is required/requested.   Insurance verification completed.   The patient is insured through [facility].   Per test claim: PA required; PA submitted to above mentioned insurance via CoverMyMeds Key/confirmation #/EOC [id number] Status is pending

## 2023-02-24 NOTE — Unmapped (Signed)
 [facility] Clinical Assessment & Refill Coordination Note    Patient reports to be doing well on Humira; discussed spots on hands from previous encounter, pt has already discussed with primary care provider who believes it is purpura     Carolyn Newton, DOB: 05/03/1960  Phone: [phone] (home)     All above HIPAA information was verified with patient.     Was a Nurse, learning disability used for this call? No    Specialty Medication(s):   Inflammatory Disorders: Humira     Current Outpatient Medications   Medication Sig Dispense Refill    albuterol (PROVENTIL HFA;VENTOLIN HFA) 90 mcg/actuation inhaler Inhale 2 puffs.      albuterol HFA 90 mcg/actuation inhaler Inhale 2 puffs.      amoxicillin-clavulanate (AUGMENTIN) 875-125 mg per tablet Take 1 tablet by mouth every twelve (12) hours. (Patient not taking: Reported on 09/27/2022)      benzonatate (TESSALON) 200 MG capsule Take 200 mg by mouth. (Patient not taking: Reported on 09/27/2022)      blood sugar diagnostic (ON CALL EXPRESS TEST STRIP) Strp Use as instructed to check blood sugar once a day. 100 each 12    blood sugar diagnostic Strp TEST TWICE DAILY      blood-glucose meter (ON CALL EXPRESS METER) Misc Use to check blood sugar once a day 1 each 0    blood-glucose meter kit Use to check blood sugar 2 times per day      carvedilol (COREG) 12.5 MG tablet TAKE 1 TABLET BY MOUTH TWICE DAILY WITH A MEAL      chlorhexidine (PERIDEX) 0.12 % solution 5 mL. (Patient not taking: Reported on 09/27/2022)      cholecalciferol, vitamin D3-125 mcg, 5,000 unit,, 125 mcg (5,000 unit) tablet Take 1 tablet (125 mcg total) by mouth.      clobetasoL (TEMOVATE) 0.05 % cream Apply topically Two (2) times a day. 60 g 5    clobetasol (TEMOVATE) 0.05 % cream Apply topically two (2) times a day. To rash as needed 60 g 1    clonazePAM (KLONOPIN) 0.5 MG tablet  (Patient not taking: Reported on 09/27/2022)      dexlansoprazole (DEXILANT) 60 mg capsule       diclofenac sodium (VOLTAREN) 1 % gel Apply 2 g topically 2 (two) times daily as needed (for pain). 100/4=25 (Patient not taking: Reported on 09/27/2022)      docusate sodium (COLACE) 100 MG capsule Take 100 mg by mouth. (Patient not taking: Reported on 09/27/2022)      empty container (SHARPS CONTAINER) Misc Use as directed to dispose of Humira needles 1 each 2    empty container Misc USE AS DIRECTED 1 each 2    empty container Misc use as directed 1 each 0    escitalopram oxalate (LEXAPRO) 20 MG tablet TAKE 1 TABLET BY MOUTH DAILY (Patient not taking: Reported on 09/27/2022)      esomeprazole (NEXIUM) 40 MG capsule Take 40 mg by mouth. (Patient not taking: Reported on 09/27/2022)      fluticasone propionate (FLONASE) 50 mcg/actuation nasal spray 1 spray into each nostril. (Patient not taking: Reported on 09/27/2022)      furosemide (LASIX) 20 MG tablet Take 20 mg by mouth. (Patient not taking: Reported on 09/27/2022)      gabapentin (NEURONTIN) 300 MG capsule Take 1 capsule (300 mg total) by mouth 2 (two) times daily as needed (for pain).  1    HUMIRA PEN CITRATE FREE 40 MG/0.4  ML Inject the contents of 1 pen (40 mg total) under the skin every seven (7) days. 4 each 11    hydrOXYzine (ATARAX) 25 MG tablet Take 1/4 tablet in the morning and 1/2 tablet in the evenings as needed for itch 30 tablet 5    hydrOXYzine (ATARAX) 25 MG tablet Take 1 tablet (25 mg total) by mouth.      ketoconazole (NIZORAL) 2 % cream Apply 1 Application topically 2 (two) times daily. Apply under breasts      lancets 28 gauge Misc 1 each.      lancets 30 gauge Misc Use as instructed to test sugars once daily 100 each 12    levoFLOXacin (LEVAQUIN) 750 MG tablet Take 750 mg by mouth. (Patient not taking: Reported on 09/27/2022)      lidocaine (LIDODERM) 5 % patch Place 1 patch onto the skin daily as needed. Apply patch to area most significant pain once per day. Remove and discard patch within 12 hours of application. (Patient not taking: Reported on 09/27/2022) LYCOPENE ORAL Take by mouth daily.      metFORMIN (GLUCOPHAGE) 500 MG tablet TAKE 2 TABLETS(1000 MG) BY MOUTH DAILY WITH FOOD      metFORMIN (GLUCOPHAGE-XR) 500 MG 24 hr tablet Take 2 tablets (1,000 mg total) by mouth at bedtime.      methocarbamoL (ROBAXIN) 500 MG tablet       morphine (MSIR) 30 MG tablet Take 1 tablet (30 mg total) by mouth.      moxifloxacin (AVELOX) 400 mg tablet Take 1 tablet (400 mg total) by mouth daily. (Patient not taking: Reported on 09/27/2022) 7 tablet 0    multivitamin with minerals tablet Take 1 tablet by mouth daily.      naloxone (NARCAN) 4 mg nasal spray Call 911 Instill ONE SPRAY into one nostril. REPEAT EVERY FIVE MINUTES if no OR minimal response      naltrexone (DEPADE) 50 mg tablet Split tablets into quarters. Take one-quarter of a tablet once daily 30 tablet 11    neomycin-polymyxin-dexamethasone (MAXITROL) 3.5mg /mL-10,000 unit/mL-0.1 % ophthalmic suspension SHAKE LIQUID AND INSTILL 1 DROP IN LEFT EYE THREE TIMES DAILY (Patient not taking: Reported on 09/27/2022)      nortriptyline (PAMELOR) 75 MG capsule Take 75 mg by mouth. (Patient not taking: Reported on 09/27/2022)      nystatin (MYCOSTATIN) 100,000 unit/mL suspension  (Patient not taking: Reported on 09/27/2022)      omeprazole (PRILOSEC) 20 MG capsule Take 20 mg by mouth. (Patient not taking: Reported on 09/27/2022)      oxyCODONE-acetaminophen (PERCOCET) 10-325 mg per tablet 1 Tablet Four Times A Day AS NEEDED. (Patient not taking: Reported on 09/27/2022)      oxyCODONE-acetaminophen (PERCOCET) 5-325 mg per tablet 1 Tablet Four Times A Day as needed. (Patient not taking: Reported on 09/27/2022)      potassium chloride (KLOR-CON) 10 MEQ CR tablet TAKE 1 TABLET BY MOUTH DAILY      potassium chloride (KLOR-CON) 10 MEQ CR tablet Take 1 tablet (10 mEq total) by mouth daily.      predniSONE (DELTASONE) 20 MG tablet Take 20 mg by mouth. (Patient not taking: Reported on 09/27/2022)      QUEtiapine (SEROQUEL) 50 MG tablet TAKE ONE TABLET BY MOUTH EVERY MORNING AND TAKE TWO TABLETS BY MOUTH AT BEDTIME      rivaroxaban (XARELTO) 20 mg tablet TAKE 1 TABLET BY MOUTH EVERY DAY WITH SUPPER      suvorexant (BELSOMRA) 20 mg tablet Take 20  mg by mouth. (Patient not taking: Reported on 09/27/2022)      SYMBICORT 160-4.5 mcg/actuation inhaler Inhale 2 puffs into the lungs TWICE DAILY 120/4=30  5    tobramycin-dexAMETHasone (TOBRADEX) 0.3-0.1 % ophthalmic suspension AS DIRECTED (Patient not taking: Reported on 09/27/2022)      topiramate (TOPAMAX) 50 MG tablet TAKE 1 TABLET BY MOUTH TWICE DAILY      topiramate (TOPAMAX) 50 MG tablet Take 1 tablet (50 mg total) by mouth.      venlafaxine (EFFEXOR-XR) 75 MG 24 hr capsule Take 1 capsule (75 mg total) by mouth.       Current Facility-Administered Medications   Medication Dose Route Frequency Provider Last Rate Last Admin    triamcinolone acetonide (KENALOG) injection 10 mg  10 mg Intradermal Once Elsie Stain, MD            Changes to medications: Jacklin reports no changes at this time.    Medication list has been reviewed and updated in Epic: Yes    Allergies   Allergen Reactions    Other Other (See Comments)     Patient has Sarcoidosis and can't tolerate any metals    Suvorexant Other (See Comments)     Larey Seat out of bed while asleep: I'm waking up as I'm falling on the floor; Night terrors    Enalapril Maleate      REACTION: cough    Hydrochlorothiazide      REACTION: Low potassium    Hydroxychloroquine Sulfate      REACTION: vision changes    Iodine      Mother was intolerant--CODED on CT table---pt never tired.    Latex      Hives    Nickel Other (See Comments)     Pt has a titanium right and left knee - nickel causes skin irritations that forms into blisters and sores  Other reaction(s): Other (See Comments)  Pt has a titanium right knee - nickel causes skin irritations that forms into blisters and sores    Adhesive Tape-Silicones Other (See Comments)     Medical tape causes bruising Doxycycline Diarrhea and Nausea And Vomiting    Morphine Itching and Nausea Only    Pregabalin Other (See Comments)     Made me feel high       Changes to allergies: No    Allergies have been reviewed and updated in Epic: Yes    SPECIALTY MEDICATION ADHERENCE     Humira 40  mg/0.34mL : 4 doses of medicine on hand   Medication Adherence    Patient reported X missed doses in the last month: 0  Specialty Medication: Humira 40mg /0.28mL -1q7d  Patient is on additional specialty medications: No  Informant: patient          Specialty medication(s) dose(s) confirmed: Regimen is correct and unchanged.     Are there any concerns with adherence? No    Adherence counseling provided? Not needed    CLINICAL MANAGEMENT AND INTERVENTION      Clinical Benefit Assessment:    Do you feel the medicine is effective or helping your condition? Yes    Clinical Benefit counseling provided? Not needed    Adverse Effects Assessment:    Are you experiencing any side effects? No    Are you experiencing difficulty administering your medicine? No    Quality of Life Assessment:    Quality of Life    Rheumatology  Oncology  Dermatology  1. What impact has your specialty medication  had on the symptoms of your skin condition (i.e. itchiness, soreness, stinging)?: Some  2. What impact has your specialty medication had on your comfort level with your skin?: Some  Cystic Fibrosis          How many days over the past month did your Sarcoid  keep you from your normal activities? For example, brushing your teeth or getting up in the morning. 0    Have you discussed this with your provider? Not needed    Acute Infection Status:    Acute infections noted within Epic:  No active infections  Patient reported infection: None    Therapy Appropriateness:    Is therapy appropriate based on current medication list, adverse reactions, adherence, clinical benefit and progress toward achieving therapeutic goals? Yes, therapy is appropriate and should be continued DISEASE/MEDICATION-SPECIFIC INFORMATION      For patients on injectable medications: Patient currently has 4 doses left.  Next injection is scheduled for 2/12.    Chronic Inflammatory Diseases: Have you experienced any flares in the last month? No  Has this been reported to your provider? Not applicable    PATIENT SPECIFIC NEEDS     Does the patient have any physical, cognitive, or cultural barriers? No    Is the patient high risk? No    Did the patient require a clinical intervention? No    Does the patient require physician intervention or other additional services (i.e., nutrition, smoking cessation, social work)? No    Does the patient have an additional or emergency contact listed in their chart? Yes    SOCIAL DETERMINANTS OF HEALTH     At the [facility], we have learned that life circumstances - like trouble affording food, housing, utilities, or transportation can affect the health of many of our patients.   That is why we wanted to ask: are you currently experiencing any life circumstances that are negatively impacting your health and/or quality of life? No    Social Drivers of Health     Food Insecurity: No Food Insecurity (04/29/2022)    Received from [facility]    Hunger Vital Sign     Worried About Running Out of Food in the Last Year: Never true     Ran Out of Food in the Last Year: Never true   Internet Connectivity: Not on file   Housing/Utilities: Not on file   Tobacco Use: Medium Risk (09/27/2022)    Patient History     Smoking Tobacco Use: Former     Smokeless Tobacco Use: Never     Passive Exposure: Not on file   Transportation Needs: No Transportation Needs (04/29/2022)    Received from [facility] - Transportation     Lack of Transportation (Medical): No     Lack of Transportation (Non-Medical): No   Alcohol Use: Unknown (06/04/2018)    Received from [facility] visits prior to 03/16/2022.    AUDIT-C     Frequency of Alcohol Consumption: Monthly or less Average Number of Drinks: Not on file     Frequency of Binge Drinking: Not on file   Interpersonal Safety: Not on file   Physical Activity: Inactive (04/29/2022)    Received from [facility]    Exercise Vital Sign     Days of Exercise per Week: 0 days     Minutes of Exercise per Session: 0 min   Intimate Partner Violence: Not At Risk (04/29/2022)    Received  from [facility]    Humiliation, Afraid, Rape, and Kick questionnaire     Fear of Current or Ex-Partner: No     Emotionally Abused: No     Physically Abused: No     Sexually Abused: No   Stress: No Stress Concern Present (04/29/2022)    Received from [facility] - Occupational Stress Questionnaire     Feeling of Stress : Not at all   Substance Use: Not on file (11/19/2022)   Social Connections: Socially Isolated (04/29/2022)    Received from [facility]    Social Connection and Isolation Panel [NHANES]     Frequency of Communication with Friends and Family: Once a week     Frequency of Social Gatherings with Friends and Family: Once a week     Attends Religious Services: Never     Database administrator or Organizations: No     Attends Engineer, structural: Never     Marital Status: Never married   Physicist, medical Strain: Low Risk  (04/29/2022)    Received from [name]    Overall Financial Resource Strain (CARDIA)     Difficulty of Paying Living Expenses: Not hard at all   Depression: Not at risk (01/01/2022)    Received from [facility]    PHQ-2     Patient Health Questionnaire-2 Score: 0   Health Literacy: Not on file       Would you be willing to receive help with any of the needs that you have identified today? Not applicable       SHIPPING     Specialty Medication(s) to be Shipped:   Inflammatory Disorders: Humira    Other medication(s) to be shipped: No additional medications requested for fill at this time     Changes to insurance: No    Delivery Scheduled: Yes, Expected medication delivery date: 2/18.     Medication will be delivered via UPS to the confirmed prescription address in [facility].    The patient will receive a drug information handout for each medication shipped and additional FDA Medication Guides as required.  Verified that patient has previously received a Conservation officer, historic buildings and a Surveyor, mining.    The patient or caregiver noted above participated in the development of this care plan and knows that they can request review of or adjustments to the care plan at any time.      All of the patient's questions and concerns have been addressed.    Teofilo Pod, PharmD   [facility] Specialty Pharmacist

## 2023-02-24 NOTE — Telephone Encounter (Signed)
 Pharmacy Patient Advocate Encounter  Received notification from [facility] Medicare that Prior Authorization for Budesonide  ER 9MG  er tablets has been DENIED.  See denial reason below. No denial letter attached in CMM. Will attach denial letter to Media tab once received.   PA #/Case ID/Reference #:  [id number]

## 2023-02-25 ENCOUNTER — Other Ambulatory Visit (HOSPITAL_COMMUNITY): Payer: Self-pay

## 2023-03-03 MED FILL — HUMIRA PEN CITRATE FREE 40 MG/0.4 ML: SUBCUTANEOUS | 28 days supply | Qty: 4 | Fill #5

## 2023-03-10 ENCOUNTER — Encounter: Payer: Self-pay | Admitting: Adult Health

## 2023-03-10 ENCOUNTER — Telehealth: Payer: Self-pay | Admitting: Adult Health

## 2023-03-10 ENCOUNTER — Other Ambulatory Visit: Payer: Self-pay | Admitting: Internal Medicine

## 2023-03-10 ENCOUNTER — Ambulatory Visit (INDEPENDENT_AMBULATORY_CARE_PROVIDER_SITE_OTHER): Payer: 59 | Admitting: Adult Health

## 2023-03-10 VITALS — BP 118/68 | HR 61

## 2023-03-10 DIAGNOSIS — I2782 Chronic pulmonary embolism: Secondary | ICD-10-CM

## 2023-03-10 DIAGNOSIS — Z79891 Long term (current) use of opiate analgesic: Secondary | ICD-10-CM | POA: Insufficient documentation

## 2023-03-10 DIAGNOSIS — J441 Chronic obstructive pulmonary disease with (acute) exacerbation: Secondary | ICD-10-CM | POA: Diagnosis not present

## 2023-03-10 DIAGNOSIS — D869 Sarcoidosis, unspecified: Secondary | ICD-10-CM | POA: Diagnosis not present

## 2023-03-10 DIAGNOSIS — R911 Solitary pulmonary nodule: Secondary | ICD-10-CM | POA: Diagnosis not present

## 2023-03-10 DIAGNOSIS — J9611 Chronic respiratory failure with hypoxia: Secondary | ICD-10-CM

## 2023-03-10 DIAGNOSIS — J449 Chronic obstructive pulmonary disease, unspecified: Secondary | ICD-10-CM | POA: Diagnosis not present

## 2023-03-10 LAB — CBC WITH DIFFERENTIAL/PLATELET
Basophils Absolute: 0.1 10*3/uL (ref 0.0–0.1)
Basophils Relative: 0.6 % (ref 0.0–3.0)
Eosinophils Absolute: 0.3 10*3/uL (ref 0.0–0.7)
Eosinophils Relative: 2.9 % (ref 0.0–5.0)
HCT: 39.6 % (ref 36.0–46.0)
Hemoglobin: 13 g/dL (ref 12.0–15.0)
Lymphocytes Relative: 27.7 % (ref 12.0–46.0)
Lymphs Abs: 2.6 10*3/uL (ref 0.7–4.0)
MCHC: 32.9 g/dL (ref 30.0–36.0)
MCV: 101 fL — ABNORMAL HIGH (ref 78.0–100.0)
Monocytes Absolute: 0.7 10*3/uL (ref 0.1–1.0)
Monocytes Relative: 7.7 % (ref 3.0–12.0)
Neutro Abs: 5.8 10*3/uL (ref 1.4–7.7)
Neutrophils Relative %: 61.1 % (ref 43.0–77.0)
Platelets: 200 10*3/uL (ref 150.0–400.0)
RBC: 3.92 Mil/uL (ref 3.87–5.11)
RDW: 15.5 % (ref 11.5–15.5)
WBC: 9.5 10*3/uL (ref 4.0–10.5)

## 2023-03-10 LAB — COMPREHENSIVE METABOLIC PANEL
ALT: 29 U/L (ref 0–35)
AST: 33 U/L (ref 0–37)
Albumin: 3.9 g/dL (ref 3.5–5.2)
Alkaline Phosphatase: 72 U/L (ref 39–117)
BUN: 22 mg/dL (ref 6–23)
CO2: 27 meq/L (ref 19–32)
Calcium: 9.4 mg/dL (ref 8.4–10.5)
Chloride: 102 meq/L (ref 96–112)
Creatinine, Ser: 0.89 mg/dL (ref 0.40–1.20)
GFR: 69.39 mL/min (ref >60.00)
Glucose, Bld: 106 mg/dL — ABNORMAL HIGH (ref 70–99)
Potassium: 4.1 meq/L (ref 3.5–5.1)
Sodium: 139 meq/L (ref 135–145)
Total Bilirubin: 0.3 mg/dL (ref 0.2–1.2)
Total Protein: 7.8 g/dL (ref 6.0–8.3)

## 2023-03-10 NOTE — Assessment & Plan Note (Addendum)
 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  Chronic changes on CT scan.  CT chest in August 2024 showed improving left lower lobe process.  Would repeat CT chest in August 2025. Pulmonary rehab referral sent

## 2023-03-10 NOTE — Assessment & Plan Note (Signed)
 Currently compensated on O2.  With perceived benefit.  Continue on O2 at 2 L.  See office note for qualifying walk test today.

## 2023-03-10 NOTE — Patient Instructions (Addendum)
 Continue on Symbicort 2 puffs Twice daily, rinse after use  We will reach out to pulmonary rehab to get started.  Yearly eye exams.  Albuterol inhaler or Neb As needed   Continue on Xarelto 20mg  daily  Avoid NSAIDS, aleve, advil, or motrin.  Mucinex Twice daily  As needed  Cough/congestion  Delsym 2 tsp Twice daily for cough.  Continue on Oxygen 2l/m.  Activity as tolerated.  Work on healthy weight loss.  CT chest in August 2025 .  Follow up Dr. Vassie Loll in 4-6 months and As needed

## 2023-03-10 NOTE — Progress Notes (Signed)
 @Patient  ID: [name], female    DOB: August 20, 1960, 63 y.o.   MRN: [id number]  Chief Complaint  Patient presents with   Follow-up    Referring provider: Plotnikov, Georgina Quint, MD  HPI: This 63 year old female former smoker followed for pulmonary and cutaneous sarcoidosis, chronic bronchitis, lung nodules, recurrent pulmonary embolism on lifelong anticoagulation therapy, chronic respiratory failure on oxygen.  History of sleep apnea-CPAP intolerant Medical history significant for chronic back pain secondary to spinal stenosis  TEST/EVENTS :  2018 started on Humira for reoccurring cutaneous lesions not responding to methotrexate. Previously on prednisone but tapered off.   Admitted 07/21/2016 for RLE DVT-had stopped Xarelto 04/2016 for prior pulmonary embolism   CT scan = chronic appearing subsegmental PE , on Xarelto lifelong    PFT 05/2014 >> FVC 65%, ratio 69, FEV1 58%, DLCO 62%   VQ Scan 08/17/15 >> LLL & RUL acute PE    ECHO 08/18/15 >> mild LVH , EF 55-60%, Gr 1 DD .    LE Venous Doppler 08/18/15 >> neg DVT , no bakers cyst, ?prominent inguinal lymph node measuring 3.1cm    MRI brain neg [facility] = MRI spine = mild to moderate disc protrusion/left-sided spinal stenosis-chronic pain   NPSG 12/2004 mild, RDI 13/h   SARS-CoV-2 July 23, 2018- negative   CT chest July 2020 showed stable sarcoid to mildly decreased.(Nodular opacities (stable mediastinal and bilateral hilar adenopathy  decreased.(Nodular opacities (stable mediastinal and bilateral hilar adenopathy.     PFTs done in July 2020 showed stable lung function with FEV1 at 70%, ratio 81, FVC 67% and DLCO was 61%.  This was similar to 2016  PFTs 08/2022 postbronchodilator normal ratio 72, FVC 58%, FEV1 54%, TLC 65%, DLCO 62%/13.8   CT chest August 19, 2022 showed chronic lung changes of sarcoidosis with pulmonary scarring, superior segment left lower lobe process has improved likely resolving atelectasis  measuring 3.1 x 1.2 cm.    03/10/2023 Follow up : Sarcoid, Bronchitis , O2 RF  Patient returns for a 62-month follow-up.  She is followed for a cutaneous and pulmonary sarcoidosis.  She is followed by dermatology for cutaneous sarcoid.  Remains on Humira.  Denies any skin lesions.  Followed at [facility] ophthalmology for chronic eye issues.  Patient send overall breathing has been doing okay.  She remains on Symbicort twice daily.  Denies any increased cough or wheezing.  She was referred to pulmonary rehab last visit.  Unfortunately was not able to make it after she had some issues after recent fall.  She says she is ready to go to pulmonary rehab now.  We have put in new order and for pulmonary rehab.  Has been actually trying to be more active.  Is walking twice a day using her rolling walker.  Remains on oxygen 2 L.  Oxygen qualification walk today in the office as below required by insurance and DME company. SATURATION QUALIFICATIONS: (This note is used to comply with regulatory documentation for home oxygen) Patient Saturations on Room Air at Rest = 92% Patient Saturations on Room Air with exertion = 88% Patient Saturations on 2 Liters of pulsed oxygen while Ambulating w/POC= 98% Test date 03/10/23 CT chest in August 2024 showed improvement in left lower lobe process with decrease in size likely resolving atelectasis.  Discussed getting a CT chest follow-up in August 2025.   She remains on Xarelto for recurrent PE.  Endorses compliance.  Patient education given on anticoagulation therapy.  Patient denies  any known bleeding. No recent labs   Does tell me that she had a fall several months ago at home.  Unsure what caused the fall.  Was seen by primary care.  Says she is fully recovered.      Allergies  Allergen Reactions   Belsomra [Suvorexant] Other (See Comments)    Larey Seat out of bed while asleep: "I'm waking up as I'm falling on the floor;" "Night terrors"   Enalapril Maleate Cough    Latex Hives   Nickel Other (See Comments)    Blisters  Pt has a titanium right and left knee - nickel causes skin irritations that form into blisters and sores   Other Other (See Comments)    Patient has Sarcoidosis and can't tolerate any metals   Augmentin [Amoxicillin-Pot Clavulanate]     Purpura type rash   Fentanyl     Skin irritation   Morphine And Codeine Itching and Nausea Only   Doxycycline Diarrhea and Nausea And Vomiting   Hydrochlorothiazide Other (See Comments)    Low potassium levels    Hydroxychloroquine Sulfate Other (See Comments)    Vision changes    Lyrica [Pregabalin] Other (See Comments)    "Made me feel high"   Tape Other (See Comments)    Medical tape causes bruising    Immunization History  Administered Date(s) Administered   Fluad Quad(high Dose 65+) 10/15/2021   Fluad Trivalent(High Dose 65+) 09/30/2022   H1N1 12/24/2007   Influenza Split 10/11/2010   Influenza Whole 10/14/2007, 10/05/2008, 10/26/2009, 11/13/2011   Influenza,inj,Quad PF,6+ Mos 09/21/2012, 09/24/2013, 10/06/2014, 09/05/2015, 11/06/2016, 09/24/2017, 10/07/2018, 11/15/2019, 10/13/2020   Influenza,inj,Quad PF,6-35 Mos 09/14/2020   PFIZER(Purple Top)SARS-COV-2 Vaccination 03/31/2019, 04/21/2019, 11/27/2019   PNEUMOCOCCAL CONJUGATE-20 07/19/2020   PPD Test 11/18/2014   Pneumococcal Conjugate-13 07/30/2010, 12/29/2012   Pneumococcal Polysaccharide-23 12/23/2013   Tdap 06/28/2014    Past Medical History:  Diagnosis Date   ALLERGIC RHINITIS 10/26/2009   no current problems   Anxiety    B12 DEFICIENCY 08/25/2007   takes B12 supplement   Complication of anesthesia    pt has had difficulty following anesthesia with her knee in 2016-unable to care for herself afterward   Depression    takes effexor xr daily   Diabetes mellitus without complication (HCC)    was on insulin but has been off since Nov 2015 and now only takes Metformin daily   DYSPNEA 04/28/2009   uses oxygen 24/7, on 2L  via [place]   Esophageal reflux    takes Nexium daily   Fibromyalgia    Headache    last migraine 2-27yrs ago;takes Topamax daily   History of shingles    Hypertension    takes Coreg daily   Insomnia    takes Nortriptyline nightly    Long-term memory loss    OSA (obstructive sleep apnea)    doesn't use CPAP;sleep study in epic from 2006   Osteoarthritis    Panic attacks    PONV (postoperative nausea and vomiting)    on time in 2016 knee surgery   Rheumatoid arthritis (HCC)    Sarcoidosis    Dr. Wilford Grist   Stroke [facility])    Vitamin D deficiency    is supposed to take Vit D but can't afford it    Tobacco History: Social History   Tobacco Use  Smoking Status Former   Current packs/day: 0.00   Average packs/day: 0.5 packs/day for 16.0 years (8.0 ttl pk-yrs)   Types: Cigarettes   Start date: 1988  Quit date: 2004   Years since quitting: 21.1  Smokeless Tobacco Never  Tobacco Comments   quit smoking in 2004   Counseling given: Not Answered Tobacco comments: quit smoking in 2004   Outpatient Medications Prior to Visit  Medication Sig Dispense Refill   acetaminophen (TYLENOL) 325 MG tablet Take 1-2 tablets (325-650 mg total) by mouth every 6 (six) hours as needed for mild pain (pain score 1-3 or temp > 100.5). 30 tablet 1   albuterol (PROVENTIL) (2.5 MG/3ML) 0.083% nebulizer solution Take 3 mLs (2.5 mg total) by nebulization every 6 (six) hours as needed for wheezing or shortness of breath. 75 mL 5   albuterol (VENTOLIN HFA) 108 (90 Base) MCG/ACT inhaler INHALE TWO puffs into THE lungs EVERY SIX HOURS AS NEEDED wheezind AND For SHORTNESS OF BREATH 6.7 g 3   B-D ULTRA-FINE 33 LANCETS MISC Use as instructed to test sugars once  daily 100 each 12   Budesonide ER 9 MG TB24 Take 1 tablet (9 mg total) by mouth in the morning. 30 tablet 5   budesonide-formoterol (SYMBICORT) 80-4.5 MCG/ACT inhaler Inhale 2 puffs into the lungs 2 (two) times daily. INHALE TWO puffs into THE lungs TWICE  DAILY 1 each 12   carvedilol (COREG) 12.5 MG tablet TAKE ONE TABLET BY MOUTH TWICE DAILY With meals 180 tablet 3   Cholecalciferol (VITAMIN D-3) 125 MCG (5000 UT) TABS Take 5,000 Units by mouth daily.     clotrimazole-betamethasone (LOTRISONE) cream Apply 1 Application topically 2 (two) times daily. 90 g 1   colesevelam (WELCHOL) 625 MG tablet Take 1,250 mg by mouth 2 (two) times daily.     Cyanocobalamin (VITAMIN B-12) 1000 MCG SUBL Place 1 tablet (1,000 mcg total) under the tongue daily. 100 tablet 3   dexlansoprazole (DEXILANT) 60 MG capsule TAKE ONE CAPSULE BY MOUTH EVERY DAY 90 capsule 1   diphenoxylate-atropine (LOMOTIL) 2.5-0.025 MG tablet Take 1-2 tablets by mouth 4 (four) times daily as needed for diarrhea or loose stools. 60 tablet 1   erythromycin ophthalmic ointment Place into both eyes at bedtime. 3.5 g 2   gabapentin (NEURONTIN) 300 MG capsule TAKE 1 CAPSULE BY MOUTH EVERY MORNING. MAY TAKE SECOND CAPSULE DURING THE DAY AS NEEDED FOR PAIN 180 capsule 1   glucose blood (ACCU-CHEK GUIDE) test strip Use to check blood sugar once a day. 100 each 12   HUMIRA PEN 40 MG/0.4ML PNKT Inject 40 mg as directed every Wednesday.      hydrOXYzine (ATARAX) 25 MG tablet TAKE ONE TABLET BY MOUTH EVERY EIGHT HOURS AS NEEDED 60 tablet 3   metFORMIN (GLUCOPHAGE-XR) 500 MG 24 hr tablet Take 1 tablet (500 mg total) by mouth at bedtime. 180 tablet 3   Multiple Vitamins-Minerals (MULTIVITAMIN WOMEN 50+) TABS Take 1 tablet by mouth daily.     naloxone (NARCAN) nasal spray 4 mg/0.1 mL Place 1 spray into the nose once as needed (overdose).     naltrexone (DEPADE) 50 MG tablet Take 50 mg by mouth daily.     OXYGEN Inhale 2 L into the lungs continuous.     polyvinyl alcohol (LIQUIFILM TEARS) 1.4 % ophthalmic solution Place 1 drop into both eyes as needed for dry eyes. 15 mL 0   potassium chloride (KLOR-CON M) 10 MEQ tablet TAKE ONE TABLET BY MOUTH EVERY DAY 90 tablet 1   QUEtiapine (SEROQUEL XR) 200 MG 24 hr  tablet Take 1/2 tab at hs     topiramate (TOPAMAX) 50 MG tablet TAKE ONE  TABLET BY MOUTH TWICE DAILY 180 tablet 1   venlafaxine XR (EFFEXOR-XR) 75 MG 24 hr capsule TAKE ONE CAPSULE BY MOUTH EVERY DAY WITH breakfast 30 capsule 11   XARELTO 20 MG TABS tablet TAKE ONE TABLET BY MOUTH EVERY DAY 90 tablet 3   metroNIDAZOLE (FLAGYL) 500 MG tablet Take 1 tablet (500 mg total) by mouth 3 (three) times daily. 21 tablet 0   morphine (MSIR) 30 MG tablet Take 1 tablet (30 mg total) by mouth 2 (two) times daily as needed for severe pain. 60 tablet 0   moxifloxacin (VIGAMOX) 0.5 % ophthalmic solution Apply to eye as directed.     mupirocin ointment (BACTROBAN) 2 % Use tid- qid 30 g 0   sulfamethoxazole-trimethoprim (BACTRIM DS) 800-160 MG tablet Take 1 tablet by mouth 2 (two) times daily. 20 tablet 0   tobramycin-dexamethasone (TOBRADEX) ophthalmic solution as directed.     No facility-administered medications prior to visit.     Review of Systems:   Constitutional:   No  weight loss, night sweats,  Fevers, chills, +fatigue, or  lassitude.  HEENT:   No headaches,  Difficulty swallowing,  Tooth/dental problems, or  Sore throat,                No sneezing, itching, ear ache, nasal congestion, post nasal drip,   CV:  No chest pain,  Orthopnea, PND, swelling in lower extremities, anasarca, dizziness, palpitations, syncope.   GI  No heartburn, indigestion, abdominal pain, nausea, vomiting, diarrhea, change in bowel habits, loss of appetite, bloody stools.   Resp:   No chest wall deformity  Skin: no rash or lesions.  GU: no dysuria, change in color of urine, no urgency or frequency.  No flank pain, no hematuria   MS:  No joint pain or swelling.  No decreased range of motion.  No back pain.    Physical Exam  BP 118/68   Pulse 61   LMP 05/19/2003   SpO2 99% Comment: 2L POC  GEN: A/Ox3; pleasant , NAD, well nourished , chronically ill-appearing, on oxygen, rolling walker with bench seat    HEENT:  [place]/AT,   NOSE-clear, THROAT-clear, no lesions, no postnasal drip or exudate noted.   NECK:  Supple w/ fair ROM; no JVD; normal carotid impulses w/o bruits; no thyromegaly or nodules palpated; no lymphadenopathy.    RESP  Clear  P & A; w/o, wheezes/ rales/ or rhonchi. no accessory muscle use, no dullness to percussion  CARD:  RRR, no m/r/g, no peripheral edema, pulses intact, no cyanosis or clubbing.  GI:   Soft & nt; nml bowel sounds; no organomegaly or masses detected.   Musco: Warm bil, no deformities or joint swelling noted.   Neuro: alert, no focal deficits noted.    Skin: Warm, no lesions or rashes    Lab Results:  CBC  No results found.  Administration History     None          Latest Ref Rng & Units 08/27/2022    2:29 PM 07/27/2018   10:30 AM 12/30/2016    9:01 AM 05/18/2014   10:41 AM  PFT Results  FVC-Pre L 1.97  2.51  P 2.07  P 2.29   FVC-Predicted Pre % 54  67  P 54  P 61   FVC-Post L 2.12  2.51  P 2.18  P 2.43   FVC-Predicted Post % 58  67  P 57  P 65   Pre FEV1/FVC % % 70  79  P 70  P 69   Post FEV1/FCV % % 72  81  P 68  P 70   FEV1-Pre L 1.38  1.99  P 1.46  P 1.58   FEV1-Predicted Pre % 49  68  P 49  P 54   FEV1-Post L 1.53  2.03  P 1.49  P 1.70   DLCO uncorrected ml/min/mmHg 13.82  13.89  P 11.74  P 16.78   DLCO UNC% % 62  61  P 41  P 62   DLCO corrected ml/min/mmHg 13.82   11.34  P   DLCO COR %Predicted % 62   40  P   DLVA Predicted % 102  84  P 66  P 91   TLC L 3.60  4.27  P 4.07  P   TLC % Predicted % 65  77  P 74  P   RV % Predicted % 73  84  P 92  P     P Preliminary result    No results found for: "NITRICOXIDE"      Assessment & Plan:   COPD (chronic obstructive pulmonary disease) (HCC) Chronic bronchitis with underlying sarcoidosis.  Appears to be stable.  Continue current regimen with Symbicort twice daily.  Albuterol inhaler and nebulizer as needed.  Sarcoidosis Pulmonary sarcoidosis-appears to be stable.  Chronic  changes on CT scan.  CT chest in August 2024 showed improving left lower lobe process.  Would repeat CT chest in August 2025.  Pulmonary embolism (HCC) History of recurrent PE on lifelong anticoagulation therapy.  Continue on Xarelto.  Patient education given.  Labs today.  Chronic respiratory failure with hypoxia (HCC) Currently compensated on O2.  With perceived benefit.  Continue on O2 at 2 L.  See office note for qualifying walk test today.    I spent   41 minutes dedicated to the care of this patient on the date of this encounter to include pre-visit review of records, face-to-face time with the patient discussing conditions above, post visit ordering of testing, clinical documentation with the electronic health record, making appropriate referrals as documented, and communicating necessary findings to members of the patients care team.   [name], NP 03/10/2023

## 2023-03-10 NOTE — Assessment & Plan Note (Signed)
 History of recurrent PE on lifelong anticoagulation therapy.  Continue on Xarelto.  Patient education given.  Labs today.

## 2023-03-10 NOTE — Assessment & Plan Note (Signed)
 Chronic bronchitis with underlying sarcoidosis.  Appears to be stable.  Continue current regimen with Symbicort twice daily.  Albuterol inhaler and nebulizer as needed.

## 2023-03-10 NOTE — Telephone Encounter (Signed)
 [name] states referral for pulmonary rehab needs to be signed by Doctor. [name] phone number is [phone].

## 2023-03-12 NOTE — Addendum Note (Signed)
 Addended by: Lajoyce Lauber A on: 03/12/2023 09:52 AM   Modules accepted: Orders

## 2023-03-12 NOTE — Telephone Encounter (Signed)
 Pulm rehab has been ordered under MD.  [name] with pulmonary rehab is aware.  Nothing further needed.

## 2023-03-14 ENCOUNTER — Telehealth (HOSPITAL_COMMUNITY): Payer: Self-pay

## 2023-03-14 ENCOUNTER — Ambulatory Visit: Payer: 59 | Admitting: Adult Health

## 2023-03-14 ENCOUNTER — Ambulatory Visit: Payer: 59 | Admitting: Gastroenterology

## 2023-03-14 NOTE — Telephone Encounter (Addendum)
 Received referral from Dr. Vassie Loll for this pt to participate in Pulmonary Rehab with the diagnosis of Chronic respiratory failure with hypoxia. Clinical review of pt follow up appt on [name], NP under Dr. Vassie Loll Pulmonary office note. Pt appropriate for scheduling for Pulmonary rehab.   Called patient to see if [name] was interested in participating in the Pulmonary Rehab Program. Patient did not answer and I was unable to leave a voicemail. Mailed letter with department number and address.

## 2023-03-17 ENCOUNTER — Ambulatory Visit: Payer: 59 | Admitting: Adult Health

## 2023-03-20 ENCOUNTER — Other Ambulatory Visit: Payer: Self-pay | Admitting: Internal Medicine

## 2023-03-24 ENCOUNTER — Encounter: Payer: Self-pay | Admitting: Podiatry

## 2023-03-24 ENCOUNTER — Ambulatory Visit (INDEPENDENT_AMBULATORY_CARE_PROVIDER_SITE_OTHER): Payer: 59 | Admitting: Podiatry

## 2023-03-24 DIAGNOSIS — M79675 Pain in left toe(s): Secondary | ICD-10-CM

## 2023-03-24 DIAGNOSIS — B351 Tinea unguium: Secondary | ICD-10-CM

## 2023-03-24 DIAGNOSIS — E1142 Type 2 diabetes mellitus with diabetic polyneuropathy: Secondary | ICD-10-CM | POA: Diagnosis not present

## 2023-03-24 DIAGNOSIS — M79674 Pain in right toe(s): Secondary | ICD-10-CM

## 2023-03-24 NOTE — Progress Notes (Signed)
 This patient returns to my office for at risk foot care.  This patient requires this care by a professional since this patient will be at risk due to having diabetes.This patient request an evaluation of her left ankle and bunion right foot.  This patient is unable to cut nails himself since the patient cannot reach his nails.These nails are painful walking and wearing shoes.  This patient presents for at risk foot care today.  General Appearance  Alert, conversant and in no acute stress.  Vascular  Dorsalis pedis and posterior tibial  pulses are palpable  bilaterally.  Capillary return is within normal limits  bilaterally. Temperature is within normal limits  bilaterally.  Neurologic  Senn-Weinstein monofilament wire test within normal limits  bilaterally. Muscle power within normal limits bilaterally.  Nails Thick disfigured discolored nails with subungual debris  from hallux to fifth toes bilaterally. No evidence of bacterial infection or drainage bilaterally.  Orthopedic  No limitations of motion  feet .  No crepitus or effusions noted.  No bony pathology or digital deformities noted.  Skin  normotropic skin with no porokeratosis noted bilaterally.  No signs of infections or ulcers noted.     Onychomycosis  Pain in right toes  Pain in left toes  Consent was obtained for treatment procedures.   Mechanical debridement of nails 1-5  bilaterally performed with a nail nipper.  Filed with dremel without incident. Told her to make an appointment with medical side of the office   Return office visit   3 months for nail care.                  Told patient to return for periodic foot care and evaluation due to potential at risk complications.   Helane Gunther DPM

## 2023-03-27 ENCOUNTER — Other Ambulatory Visit: Payer: Self-pay | Admitting: Internal Medicine

## 2023-03-28 NOTE — Unmapped (Signed)
 [facility] Refill Coordination Note    Specialty Medication(s) to be Shipped:   Inflammatory Disorders: Humira    Other medication(s) to be shipped: No additional medications requested for fill at this time     Carolyn Newton, DOB: 1960-10-05  Phone: [phone] (home)       All above HIPAA information was verified with patient.     Was a Nurse, learning disability used for this call? No    Completed refill call assessment today to schedule patient's medication shipment from the [facility]  [phone]).  All relevant notes have been reviewed.     Specialty medication(s) and dose(s) confirmed: Regimen is correct and unchanged.   Changes to medications: Nitasha reports no changes at this time.  Changes to insurance: No  New side effects reported not previously addressed with a pharmacist or physician: None reported  Questions for the pharmacist: No    Confirmed patient received a Conservation officer, historic buildings and a Surveyor, mining with first shipment. The patient will receive a drug information handout for each medication shipped and additional FDA Medication Guides as required.       DISEASE/MEDICATION-SPECIFIC INFORMATION        For patients on injectable medications: Patient currently has 1 doses left.  Next injection is scheduled for 3/19.    SPECIALTY MEDICATION ADHERENCE     Medication Adherence    Patient reported X missed doses in the last month: 0  Specialty Medication: HUMIRA PEN CITRATE FREE 40 MG/0.4 ML  Informant: patient              Were doses missed due to medication being on hold? No    HUMIRA PEN CITRATE FREE 40 MG/0.4 ML: 1 doses of medicine on hand       REFERRAL TO PHARMACIST     Referral to the pharmacist: Not needed      [facility]     Shipping address confirmed in Epic.     Cost and Payment: Patient has a $0 copay, payment information is not required.    Delivery Scheduled: Yes, Expected medication delivery date: 3/19.     Medication will be delivered via UPS to the prescription address in Epic WAM.    [name]

## 2023-03-31 ENCOUNTER — Ambulatory Visit (INDEPENDENT_AMBULATORY_CARE_PROVIDER_SITE_OTHER)

## 2023-03-31 ENCOUNTER — Ambulatory Visit (INDEPENDENT_AMBULATORY_CARE_PROVIDER_SITE_OTHER): Admitting: Podiatry

## 2023-03-31 ENCOUNTER — Encounter: Payer: Self-pay | Admitting: Podiatry

## 2023-03-31 DIAGNOSIS — M21611 Bunion of right foot: Secondary | ICD-10-CM

## 2023-03-31 DIAGNOSIS — M25572 Pain in left ankle and joints of left foot: Secondary | ICD-10-CM

## 2023-03-31 DIAGNOSIS — M7751 Other enthesopathy of right foot: Secondary | ICD-10-CM | POA: Diagnosis not present

## 2023-03-31 DIAGNOSIS — M21619 Bunion of unspecified foot: Secondary | ICD-10-CM

## 2023-03-31 DIAGNOSIS — M7752 Other enthesopathy of left foot: Secondary | ICD-10-CM | POA: Diagnosis not present

## 2023-03-31 MED ORDER — TRIAMCINOLONE ACETONIDE 10 MG/ML IJ SUSP
10.0000 mg | Freq: Once | INTRAMUSCULAR | Status: AC
  Administered 2023-03-31: 10 mg via INTRA_ARTICULAR

## 2023-04-01 MED FILL — HUMIRA PEN CITRATE FREE 40 MG/0.4 ML: SUBCUTANEOUS | 28 days supply | Qty: 4 | Fill #6

## 2023-04-01 NOTE — Progress Notes (Signed)
 Subjective:   Patient ID: [name], female   DOB: 63 y.o.   MRN: [id number]   HPI Patient presents with 2 separate problems with 1 being inflammation around the first MPJ right with structural bone deformity and inflammation pain in the left ankle that is been present for several weeks.  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  Do not recommend bony correction I am concerned about her overall health and unless it is absolutely necessary rather not do it in today I did sterile prep I injected the first MPJ right 3 mg Dexasone Kenalog 5 mg Xylocaine and for the left painful ankle of shorter-term duration I did do sterile prep injected the sinus tarsi 3 mg Kenalog 5 mg Xylocaine applied sterile dressing.  Reappoint as symptoms indicate  X-rays indicate elevation of the 1 2 intermetatarsal angle right with bone structural changes and inflammation joint left no indications of ankle injury arthritis

## 2023-04-02 ENCOUNTER — Telehealth: Payer: Self-pay | Admitting: Internal Medicine

## 2023-04-02 NOTE — Telephone Encounter (Signed)
 Copied from CRM [id number]. Topic: Clinical - Medication Question >> Apr 02, 2023  2:46 PM Sim Boast F wrote: Reason for CRM: [facility] request that Atorvastatin 10mg  or 20mg  be added to patients medication list for cholestorl to help reduce stroke due to diabetes. Please call pharmacy at [phone]

## 2023-04-07 DIAGNOSIS — J449 Chronic obstructive pulmonary disease, unspecified: Secondary | ICD-10-CM | POA: Diagnosis not present

## 2023-04-08 ENCOUNTER — Other Ambulatory Visit: Payer: Self-pay | Admitting: Internal Medicine

## 2023-04-08 NOTE — Telephone Encounter (Signed)
 Polypharmacy.  I doubt she will tolerate adding another medication.  Thanks

## 2023-04-08 NOTE — Telephone Encounter (Signed)
 Called and spoke with representative at pharmacy and expressed to them Dr.Plotnikov's recommendations. Pharmacist noted they will document response on their end

## 2023-04-11 ENCOUNTER — Encounter: Payer: Self-pay | Admitting: Internal Medicine

## 2023-04-18 ENCOUNTER — Telehealth: Payer: Self-pay | Admitting: Internal Medicine

## 2023-04-18 NOTE — Telephone Encounter (Signed)
 Copied from CRM [id number]. Topic: General - Other >> Apr 18, 2023  2:06 PM Aisha D wrote: Reason for CRM: Harold Hedge is calling from [employer] [facility] [employer] in regards to the patient. Clamesha stated that there was an order put in for wound care supply and they need the patient's most recent wound measurements to proceed. Clamesha stated that she faxed the documents over on 03/16/23. Call Back number is [phone], Fax number is [phone].

## 2023-04-21 ENCOUNTER — Encounter: Payer: Self-pay | Admitting: Endocrinology

## 2023-04-21 ENCOUNTER — Ambulatory Visit (INDEPENDENT_AMBULATORY_CARE_PROVIDER_SITE_OTHER): Payer: 59 | Admitting: Endocrinology

## 2023-04-21 VITALS — BP 130/80 | HR 71 | Resp 20 | Ht 67.0 in | Wt 258.8 lb

## 2023-04-21 DIAGNOSIS — N182 Chronic kidney disease, stage 2 (mild): Secondary | ICD-10-CM

## 2023-04-21 DIAGNOSIS — E1122 Type 2 diabetes mellitus with diabetic chronic kidney disease: Secondary | ICD-10-CM

## 2023-04-21 DIAGNOSIS — Z7984 Long term (current) use of oral hypoglycemic drugs: Secondary | ICD-10-CM | POA: Diagnosis not present

## 2023-04-21 LAB — POCT GLYCOSYLATED HEMOGLOBIN (HGB A1C): Hemoglobin A1C: 5.4 % (ref 4.0–5.6)

## 2023-04-21 MED ORDER — LANCET DEVICE MISC: 1.0000 | Freq: Three times a day (TID) | 0 refills | Status: AC

## 2023-04-21 MED ORDER — BLOOD GLUCOSE MONITORING SUPPL DEVI: 1.0000 | Freq: Three times a day (TID) | 0 refills | Status: AC

## 2023-04-21 MED ORDER — BLOOD GLUCOSE TEST VI STRP: 1.0000 | ORAL_STRIP | Freq: Every day | 3 refills | Status: DC

## 2023-04-21 MED ORDER — LANCETS MISC. MISC: 1.0000 | Freq: Every day | 3 refills | Status: DC

## 2023-04-21 MED ORDER — METFORMIN HCL ER 500 MG PO TB24: 500.0000 mg | ORAL_TABLET | Freq: Every day | ORAL | 3 refills | Status: AC

## 2023-04-21 NOTE — Patient Instructions (Signed)
 Metformin take 1 tab with supper daily.

## 2023-04-21 NOTE — Progress Notes (Unsigned)
 Outpatient Endocrinology Note [place] Tannis Burstein, MD   Patient's Name: [name] [facility]    DOB: 1960-11-18    MRN: [id number]                                                    REASON OF VISIT: New consult for type 2 diabetes mellitus  REFERRING PROVIDER: Plotnikov, Georgina Quint, MD  PCP: Tresa Garter, MD  HISTORY OF PRESENT ILLNESS:   [name] is a 63 y.o. old female with past medical history listed below, is here for new consult for type 2 diabetes mellitus.   Pertinent Diabetes History: Patient was diagnosed with type 2 diabetes mellitus in 2012/2013 timeframe, patient reports it was initially long-term use of steroid induced type 2 diabetes mellitus.  She was managed with insulin therapy in the past several years ago, stopped around 2015, lately has been only taking metformin daily.  Patient has history of sarcoidosis, used to be on long-term glucocorticoid therapy.  She had hemoglobin A1c as high as 16.4% in 2013.  In last few months she has controlled type 2 diabetes mellitus with hemoglobin A1c in the range of 5.4 to 6% range.  Chronic Diabetes Complications : Retinopathy: no. Last ophthalmology exam was done on 11/2022, following with ophthalmology regularly.  Nephropathy: no Peripheral neuropathy: no Coronary artery disease: no Stroke: no  Relevant comorbidities and cardiovascular risk factors: Obesity: yes Body mass index is 40.53 kg/m.  Hypertension: Yes  Hyperlipidemia : Yes, not on statin   Current / Home Diabetic regimen includes:  Metformin extended release 500 mg 1 tablet daily.  Prior diabetic medications: Insulin therapy in the remote past.  Glycemic data:   No glucose data to review.  Hypoglycemia: Patient has no hypoglycemic episodes. Patient has hypoglycemia awareness.  Factors modifying glucose control: 1.  Diabetic diet assessment: 2-3 times a day.   2.  Staying active or exercising: No formal exercise.  3.   Medication compliance: compliant all of the time.  Interval history  Hemoglobin A1c today 5.4%.  She has been taking metformin.  She has not been monitoring blood sugar at home.  She presented to establish diabetes care.  No complaints today.  REVIEW OF SYSTEMS As per history of present illness.   PAST MEDICAL HISTORY: Past Medical History:  Diagnosis Date   ALLERGIC RHINITIS 10/26/2009   no current problems   Anxiety    B12 DEFICIENCY 08/25/2007   takes B12 supplement   Complication of anesthesia    pt has had difficulty following anesthesia with her knee in 2016-unable to care for herself afterward   Depression    takes effexor xr daily   Diabetes mellitus without complication (HCC)    was on insulin but has been off since Nov 2015 and now only takes Metformin daily   DYSPNEA 04/28/2009   uses oxygen 24/7, on 2L via [place]   Esophageal reflux    takes Nexium daily   Fibromyalgia    Headache    last migraine 2-26yrs ago;takes Topamax daily   History of shingles    Hypertension    takes Coreg daily   Insomnia    takes Nortriptyline nightly    Long-term memory loss    OSA (obstructive sleep apnea)    doesn't use CPAP;sleep study in epic from 2006   Osteoarthritis  Panic attacks    PONV (postoperative nausea and vomiting)    on time in 2016 knee surgery   Rheumatoid arthritis (HCC)    Sarcoidosis    Dr. Wilford Grist   Stroke [facility])    Vitamin D deficiency    is supposed to take Vit D but can't afford it    PAST SURGICAL HISTORY: Past Surgical History:  Procedure Laterality Date   APPENDECTOMY     arthroscopic knee surgery Right 11-12-04   AXILLARY ABCESS IRRIGATION AND DEBRIDEMENT  Jul & [id number]   BIOPSY  11/02/2019   Procedure: BIOPSY;  Surgeon: Beverley Fiedler, MD;  Location: [facility];  Service: Gastroenterology;;   CARPAL TUNNEL RELEASE Left 05/23/2014   Procedure: CARPAL TUNNEL RELEASE;  Surgeon: Cammy Copa, MD;  Location: [facility] OR;  Service: Orthopedics;   Laterality: Left;   CHOLECYSTECTOMY N/A 02/11/2018   Procedure: LAPAROSCOPIC CHOLECYSTECTOMY WITH INTRAOPERATIVE CHOLANGIOGRAM ERAS PATHWAY;  Surgeon: Manus Rudd, MD;  Location: [facility] OR;  Service: General;  Laterality: N/A;   COLONOSCOPY WITH PROPOFOL N/A 11/02/2019   Procedure: COLONOSCOPY WITH PROPOFOL;  Surgeon: Beverley Fiedler, MD;  Location: [facility];  Service: Gastroenterology;  Laterality: N/A;   cyst removed from top of buttocks  at age 2   DACRORHINOCYSTOTOMY Bilateral 03/23/2021   Procedure: ENDOSCOPIC DACROCYSTORHINOSTOMY;  Surgeon: Dairl Ponder, MD;  Location: [facility] OR;  Service: Ophthalmology;  Laterality: Bilateral;   ENDOMETRIAL ABLATION     IR CHOLANGIOGRAM EXISTING TUBE  11/21/2017   IR CHOLANGIOGRAM EXISTING TUBE  01/16/2018   IR EXCHANGE BILIARY DRAIN  11/10/2017   IR PATIENT EVAL TECH 0-60 MINS  02/03/2018   IR PERC CHOLECYSTOSTOMY  09/13/2017   IR RADIOLOGIST EVAL & MGMT  10/08/2017   LACRIMAL DUCT EXPLORATION Right 06/26/2017   Procedure: LACRIMAL DUCT EXPLORATION AND ETHMOIDECTOMY;  Surgeon: Floydene Flock, MD;  Location: [facility] OR;  Service: Ophthalmology;  Laterality: Right;   LACRIMAL DUCT EXPLORATION Bilateral 03/23/2021   Procedure: NASAL LACRIMAL DUCT EXPLORATION;  Surgeon: Dairl Ponder, MD;  Location: [facility] OR;  Service: Ophthalmology;  Laterality: Bilateral;   LACRIMAL TUBE INSERTION Bilateral 03/23/2021   Procedure: INSERTION OF CRAWFORD LACRIMAL STENT;  Surgeon: Dairl Ponder, MD;  Location: [facility] OR;  Service: Ophthalmology;  Laterality: Bilateral;   ORIF FEMUR FRACTURE Left 07/31/2021   Procedure: left medial femoral condyle fracture fixation;  Surgeon: Cammy Copa, MD;  Location: [facility] OR;  Service: Orthopedics;  Laterality: Left;   POLYPECTOMY  11/02/2019   Procedure: POLYPECTOMY;  Surgeon: Beverley Fiedler, MD;  Location: [facility];  Service: Gastroenterology;;   TEAR DUCT PROBING Right 06/26/2017   Procedure: TEAR DUCT PROBING WITH STENT;  Surgeon:  Floydene Flock, MD;  Location: [facility] OR;  Service: Ophthalmology;  Laterality: Right;   TOTAL KNEE ARTHROPLASTY Right 11/15/2014   Procedure: TOTAL RIGHT KNEE ARTHROPLASTY;  Surgeon: Cammy Copa, MD;  Location: [facility] OR;  Service: Orthopedics;  Laterality: Right;   TOTAL KNEE ARTHROPLASTY Left 07/13/2015   Procedure: LEFT TOTAL KNEE ARTHROPLASTY;  Surgeon: Cammy Copa, MD;  Location: [facility] OR;  Service: Orthopedics;  Laterality: Left;    ALLERGIES: Allergies  Allergen Reactions   Belsomra [Suvorexant] Other (See Comments)    Larey Seat out of bed while asleep: "I'm waking up as I'm falling on the floor;" "Night terrors"   Enalapril Maleate Cough   Latex Hives   Nickel Other (See Comments)    Blisters  Pt has a titanium right and left knee - nickel causes skin irritations  that form into blisters and sores   Other Other (See Comments)    Patient has Sarcoidosis and can't tolerate any metals   Augmentin [Amoxicillin-Pot Clavulanate]     Purpura type rash   Fentanyl     Skin irritation   Morphine And Codeine Itching and Nausea Only   Doxycycline Diarrhea and Nausea And Vomiting   Hydrochlorothiazide Other (See Comments)    Low potassium levels    Hydroxychloroquine Sulfate Other (See Comments)    Vision changes    Lyrica [Pregabalin] Other (See Comments)    "Made me feel high"   Tape Other (See Comments)    Medical tape causes bruising    FAMILY HISTORY:  Family History  Problem Relation Age of Onset   Heart disease Mother    Kidney disease Mother        renal failure   Cancer Father        leukemia   Hypertension Other    Coronary artery disease Other        female 1st degree relative <60   Heart failure Other        congestive   Coronary artery disease Other        Female 1st degree relative <50   Breast cancer Other        1st degree relative <50 S   Breast cancer Sister    Colon cancer Brother 57   Stroke Brother    Heart attack Brother    Cancer Brother 62        colon ca   Esophageal cancer Neg Hx    Stomach cancer Neg Hx    Rectal cancer Neg Hx     SOCIAL HISTORY: Social History   Socioeconomic History   Marital status: Single    Spouse name: Not on file   Number of children: 1   Years of education: Not on file   Highest education level: 12th grade  Occupational History   Occupation: disabled  Tobacco Use   Smoking status: Former    Current packs/day: 0.00    Average packs/day: 0.5 packs/day for 16.0 years (8.0 ttl pk-yrs)    Types: Cigarettes    Start date: 49    Quit date: 2004    Years since quitting: 21.2   Smokeless tobacco: Never   Tobacco comments:    quit smoking in 2004  Vaping Use   Vaping status: Never Used  Substance and Sexual Activity   Alcohol use: Not Currently    Alcohol/week: 1.0 standard drink of alcohol    Types: 1 Glasses of wine per week   Drug use: No   Sexual activity: Not Currently    Birth control/protection: Post-menopausal  Other Topics Concern   Not on file  Social History Narrative   Single, broke up with partner in 2008.      Lives at home alone   Caffeine: stopped 2009   Social Drivers of Health   Financial Resource Strain: Medium Risk (09/30/2022)   Overall Financial Resource Strain (CARDIA)    Difficulty of Paying Living Expenses: Somewhat hard  Food Insecurity: Food Insecurity Present (09/30/2022)   Hunger Vital Sign    Worried About Running Out of Food in the Last Year: Sometimes true    Ran Out of Food in the Last Year: Sometimes true  Transportation Needs: No Transportation Needs (09/30/2022)   PRAPARE - Administrator, Civil Service (Medical): No    Lack of Transportation (Non-Medical): No  Physical Activity: Inactive (09/30/2022)   Exercise Vital Sign    Days of Exercise per Week: 0 days    Minutes of Exercise per Session: 0 min  Stress: Stress Concern Present (09/30/2022)   [employer] of Occupational Health - Occupational Stress Questionnaire    Feeling  of Stress : Very much  Social Connections: Socially Isolated (09/30/2022)   Social Connection and Isolation Panel [NHANES]    Frequency of Communication with Friends and Family: Never    Frequency of Social Gatherings with Friends and Family: Never    Attends Religious Services: Never    Database administrator or Organizations: No    Attends Engineer, structural: Never    Marital Status: Never married    MEDICATIONS:  Current Outpatient Medications  Medication Sig Dispense Refill   acetaminophen (TYLENOL) 325 MG tablet Take 1-2 tablets (325-650 mg total) by mouth every 6 (six) hours as needed for mild pain (pain score 1-3 or temp > 100.5). 30 tablet 1   albuterol (VENTOLIN HFA) 108 (90 Base) MCG/ACT inhaler INHALE TWO puffs into THE lungs EVERY SIX HOURS AS NEEDED wheezind AND For SHORTNESS OF BREATH 6.7 g 3   B-D ULTRA-FINE 33 LANCETS MISC Use as instructed to test sugars once  daily 100 each 12   Blood Glucose Monitoring Suppl DEVI 1 each by Does not apply route in the morning, at noon, and at bedtime. May substitute to any manufacturer covered by patient's insurance. 1 each 0   Budesonide ER 9 MG TB24 Take 1 tablet (9 mg total) by mouth in the morning. 30 tablet 5   budesonide-formoterol (SYMBICORT) 80-4.5 MCG/ACT inhaler Inhale 2 puffs into the lungs 2 (two) times daily. INHALE TWO puffs into THE lungs TWICE DAILY 1 each 12   carvedilol (COREG) 12.5 MG tablet TAKE ONE TABLET BY MOUTH TWICE DAILY With meals 180 tablet 3   Cholecalciferol (VITAMIN D-3) 125 MCG (5000 UT) TABS Take 5,000 Units by mouth daily.     clotrimazole-betamethasone (LOTRISONE) cream Apply 1 Application topically 2 (two) times daily. 90 g 1   colesevelam (WELCHOL) 625 MG tablet Take 2 tablets (1,250 mg total) by mouth 2 (two) times daily with a meal. 120 tablet 1   Cyanocobalamin (VITAMIN B-12) 1000 MCG SUBL Place 1 tablet (1,000 mcg total) under the tongue daily. 100 tablet 3   dexlansoprazole (DEXILANT) 60  MG capsule TAKE ONE CAPSULE BY MOUTH EVERY DAY 90 capsule 1   diphenoxylate-atropine (LOMOTIL) 2.5-0.025 MG tablet Take 1-2 tablets by mouth 4 (four) times daily as needed for diarrhea or loose stools. 60 tablet 1   erythromycin ophthalmic ointment Place into both eyes at bedtime. 3.5 g 2   glucose blood (ACCU-CHEK GUIDE) test strip Use to check blood sugar once a day. 100 each 12   Glucose Blood (BLOOD GLUCOSE TEST STRIPS) STRP 1 each by In Vitro route daily. May substitute to any manufacturer covered by patient's insurance. 100 each 3   HUMIRA PEN 40 MG/0.4ML PNKT Inject 40 mg as directed every Wednesday.      hydrOXYzine (ATARAX) 25 MG tablet TAKE ONE TABLET pop EVERY EIGHT HOURS AS NEEDED 60 tablet 3   Lancet Device MISC 1 each by Does not apply route in the morning, at noon, and at bedtime. May substitute to any manufacturer covered by patient's insurance. 1 each 0   Lancets Misc. MISC 1 each by Does not apply route daily. May substitute to any manufacturer covered by patient's insurance. 100  each 3   Multiple Vitamins-Minerals (MULTIVITAMIN WOMEN 50+) TABS Take 1 tablet by mouth daily.     naloxone (NARCAN) nasal spray 4 mg/0.1 mL Place 1 spray into the nose once as needed (overdose).     naltrexone (DEPADE) 50 MG tablet Take 50 mg by mouth daily.     OXYGEN Inhale 2 L into the lungs continuous.     polyvinyl alcohol (LIQUIFILM TEARS) 1.4 % ophthalmic solution Place 1 drop into both eyes as needed for dry eyes. 15 mL 0   potassium chloride (KLOR-CON M) 10 MEQ tablet TAKE ONE TABLET BY MOUTH EVERY DAY 90 tablet 1   QUEtiapine (SEROQUEL XR) 200 MG 24 hr tablet Take 1 tablet (200 mg total) by mouth at bedtime. ** STOP 300mg  er tablet ** 30 tablet 5   QUEtiapine (SEROQUEL) 50 MG tablet TAKE ONE TABLET BY MOUTH EVERY MORNING AND TAKE TWO TABLETS BY MOUTH AT BEDTIME 90 tablet 5   topiramate (TOPAMAX) 50 MG tablet TAKE ONE TABLET BY MOUTH TWICE DAILY 180 tablet 1   venlafaxine XR (EFFEXOR-XR) 75 MG  24 hr capsule TAKE ONE CAPSULE BY MOUTH EVERY DAY WITH breakfast 30 capsule 11   XARELTO 20 MG TABS tablet TAKE ONE TABLET BY MOUTH EVERY DAY 90 tablet 3   albuterol (PROVENTIL) (2.5 MG/3ML) 0.083% nebulizer solution Take 3 mLs (2.5 mg total) by nebulization every 6 (six) hours as needed for wheezing or shortness of breath. 75 mL 5   gabapentin (NEURONTIN) 300 MG capsule TAKE 1 CAPSULE BY MOUTH EVERY MORNING. MAY TAKE SECOND CAPSULE DURING THE DAY AS NEEDED FOR PAIN 180 capsule 1   metFORMIN (GLUCOPHAGE-XR) 500 MG 24 hr tablet Take 1 tablet (500 mg total) by mouth daily with supper. 90 tablet 3   No current facility-administered medications for this visit.    PHYSICAL EXAM: Vitals:   04/21/23 1456  BP: 130/80  Pulse: 71  Resp: 20  SpO2: 99%  Weight: 258 lb 12.8 oz (117.4 kg)  Height: 5\' 7"  (1.702 m)   Body mass index is 40.53 kg/m.  Wt Readings from Last 3 Encounters:  04/21/23 258 lb 12.8 oz (117.4 kg)  01/23/23 243 lb (110.2 kg)  12/02/22 257 lb (116.6 kg)    General: Well developed, well nourished female in no apparent distress.  HEENT: AT/[employer], no external lesions.  Eyes: Conjunctiva clear and no icterus. Neck: Neck supple  Lungs: Respirations not labored Neurologic: Alert, oriented, normal speech Extremities / Skin: Dry. No sores or rashes noted.  Psychiatric: Does not appear depressed or anxious   Diabetic Foot Exam - Simple   No data filed    LABS Reviewed Lab Results  Component Value Date   HGBA1C 5.4 04/21/2023   HGBA1C 6.0 04/09/2022   HGBA1C 5.1 03/21/2021   No results found for: "FRUCTOSAMINE" Lab Results  Component Value Date   CHOL 196 04/21/2023   HDL 86 04/21/2023   LDLCALC 86 04/21/2023   LDLDIRECT 107.0 06/30/2017   TRIG 137 04/21/2023   CHOLHDL 2.3 04/21/2023   Lab Results  Component Value Date   MICRALBCREAT 0.7 04/09/2022   MICRALBCREAT 0.8 02/01/2015   Lab Results  Component Value Date   CREATININE 0.89 03/10/2023   Lab Results   Component Value Date   GFR 69.39 03/10/2023    ASSESSMENT / PLAN  1. Controlled type 2 diabetes mellitus with stage 2 chronic kidney disease, without long-term current use of insulin (HCC)     Diabetes Mellitus type 2, complicated  by no known complications. - Diabetic status / severity: Controlled.  Lab Results  Component Value Date   HGBA1C 5.4 04/21/2023    - Hemoglobin A1c goal : <6.5%  Patient has controlled diabetes mellitus on current dose of metformin.  She had uncontrolled type 2 diabetes mellitus, she reports was steroid-induced for the treatment of sarcoidosis in the timeframe of 2013.  - Medications: No change.  I) continue metformin extended release 500 mg 1 tablet daily.  - Home glucose testing: Check few times a week at different times of the day.  Glucometer and test supplies prescribed. - Discussed/ Gave Hypoglycemia treatment plan.  # Consult : not required at this time.   # Annual urine for microalbuminuria/ creatinine ratio, no microalbuminuria currently.  Glucometer and test supplies prescribed. Last  Lab Results  Component Value Date   MICRALBCREAT 0.7 04/09/2022    # Foot check nightly.  # Annual dilated diabetic eye exams.   - Diet: Make healthy diabetic food choices - Life style / activity / exercise: Discussed.  2. Blood pressure  -  BP Readings from Last 1 Encounters:  04/21/23 130/80    - Control is in target.  - No change in current plans.  3. Lipid status / Hyperlipidemia - Last  Lab Results  Component Value Date   LDLCALC 86 04/21/2023   -Currently not on a statin.  Will check lipid panel today.  Patient stated has follow-up with primary care provider in the near future and would like to discuss at that time.  Discussed about indication of a statin therapy with having history of type 2 diabetes mellitus.  Consider statin therapy.  Diagnoses and all orders for this visit:  Controlled type 2 diabetes mellitus with stage 2  chronic kidney disease, without long-term current use of insulin (HCC) -     POCT glycosylated hemoglobin (Hb A1C) -     metFORMIN (GLUCOPHAGE-XR) 500 MG 24 hr tablet; Take 1 tablet (500 mg total) by mouth daily with supper. -     Lipid panel -     Blood Glucose Monitoring Suppl DEVI; 1 each by Does not apply route in the morning, at noon, and at bedtime. May substitute to any manufacturer covered by patient's insurance. -     Glucose Blood (BLOOD GLUCOSE TEST STRIPS) STRP; 1 each by In Vitro route daily. May substitute to any manufacturer covered by patient's insurance. -     Lancet Device MISC; 1 each by Does not apply route in the morning, at noon, and at bedtime. May substitute to any manufacturer covered by patient's insurance. -     Lancets Misc. MISC; 1 each by Does not apply route daily. May substitute to any manufacturer covered by patient's insurance.    DISPOSITION Follow up in clinic in 4 months suggested.   All questions answered and patient verbalized understanding of the plan.  [place] Emersyn Kotarski, MD [facility] Endocrinology [facility] [address] [place], [address] [place], [place] [address] Phone # [phone]  At least part of this note was generated using voice recognition software. Inadvertent word errors may have occurred, which were not recognized during the proofreading process.

## 2023-04-22 ENCOUNTER — Other Ambulatory Visit: Payer: Self-pay | Admitting: Internal Medicine

## 2023-04-22 LAB — LIPID PANEL
Cholesterol: 196 mg/dL (ref <200)
HDL: 86 mg/dL (ref >=50)
LDL Cholesterol (Calc): 86 mg/dL
Non-HDL Cholesterol (Calc): 110 mg/dL (ref <130)
Total CHOL/HDL Ratio: 2.3 (calc) (ref <5.0)
Triglycerides: 137 mg/dL (ref <150)

## 2023-04-23 ENCOUNTER — Ambulatory Visit: Payer: 59 | Admitting: Gastroenterology

## 2023-04-23 ENCOUNTER — Telehealth (HOSPITAL_COMMUNITY): Payer: Self-pay

## 2023-04-23 ENCOUNTER — Other Ambulatory Visit: Payer: Self-pay

## 2023-04-23 ENCOUNTER — Encounter: Payer: Self-pay | Admitting: Endocrinology

## 2023-04-23 ENCOUNTER — Ambulatory Visit: Payer: 59 | Admitting: Internal Medicine

## 2023-04-23 DIAGNOSIS — E1122 Type 2 diabetes mellitus with diabetic chronic kidney disease: Secondary | ICD-10-CM

## 2023-04-23 MED ORDER — LANCETS MISC. MISC: 3 refills | Status: AC

## 2023-04-23 MED ORDER — BLOOD GLUCOSE TEST VI STRP
ORAL_STRIP | 3 refills | Status: AC
Start: 2023-04-23 — End: ?

## 2023-04-23 NOTE — Telephone Encounter (Signed)
           [facility] [address] [place], [place] [address] Phone: [phone] Fax: [phone]           Date: 04/23/2023   Dear Marcelino Duster,  Dr. Vassie Loll has referred you to the Onsite Pulmonary Rehabilitation Program at [facility]. Unfortunately, we have been unable to reach you by telephone. If you would like more information about the program, please contact us at [phone] during the hours listed below. Any staff member will be happy to assist you.   Hours: Monday - Friday:  8:00 am to 4:30 pm    Thank you,   Pulmonary Rehabilitation Staff

## 2023-04-23 NOTE — Telephone Encounter (Signed)
 Attempted to reach pt again to see if she was interested in participating in the PR program. No answer. Unable to leave a voicemail.

## 2023-04-25 NOTE — Unmapped (Signed)
 [facility] Refill Coordination Note    Specialty Medication(s) to be Shipped:   Inflammatory Disorders: Humira     Other medication(s) to be shipped: No additional medications requested for fill at this time     Carolyn Newton, DOB: [birth year]  Phone: [phone] (home)       All above HIPAA information was verified with patient.     Was a Nurse, learning disability used for this call? No    Completed refill call assessment today to schedule patient's medication shipment from the [facility]  [phone]).  All relevant notes have been reviewed.     Specialty medication(s) and dose(s) confirmed: Regimen is correct and unchanged.   Changes to medications: Carolyn Newton reports no changes at this time.  Changes to insurance: No  New side effects reported not previously addressed with a pharmacist or physician: None reported  Questions for the pharmacist: No    Confirmed patient received a Conservation officer, historic buildings and a Surveyor, mining with first shipment. The patient will receive a drug information handout for each medication shipped and additional FDA Medication Guides as required.       DISEASE/MEDICATION-SPECIFIC INFORMATION        For patients on injectable medications: Patient currently has 1 doses left.  Next injection is scheduled for 4/16.    SPECIALTY MEDICATION ADHERENCE     Medication Adherence    Patient reported X missed doses in the last month: 0  Specialty Medication: HUMIRA  PEN CITRATE FREE 40 MG/0.4 ML  Patient is on additional specialty medications: No  Informant: patient              Were doses missed due to medication being on hold? No    HUMIRA  PEN CITRATE FREE 40 MG/0.4 ML: 1 doses of medicine on hand       REFERRAL TO PHARMACIST     Referral to the pharmacist: Not needed      [facility]     Shipping address confirmed in Epic.     Cost and Payment: Patient has a $0 copay, payment information is not required.    Delivery Scheduled: Yes, Expected medication delivery date: 4/16.     Medication will be delivered via UPS to the prescription address in Epic WAM.    [facility]yn Newton [facility]

## 2023-04-28 ENCOUNTER — Ambulatory Visit: Payer: 59

## 2023-04-29 MED FILL — HUMIRA PEN CITRATE FREE 40 MG/0.4 ML: SUBCUTANEOUS | 28 days supply | Qty: 4 | Fill #7

## 2023-04-30 ENCOUNTER — Ambulatory Visit: Admitting: Gastroenterology

## 2023-04-30 ENCOUNTER — Ambulatory Visit: Payer: 59

## 2023-04-30 VITALS — Ht 67.0 in | Wt 258.0 lb

## 2023-04-30 DIAGNOSIS — Z0000 Encounter for general adult medical examination without abnormal findings: Secondary | ICD-10-CM | POA: Diagnosis not present

## 2023-04-30 NOTE — Progress Notes (Signed)
 Subjective:   Nadie Fiumara is a 63 y.o. who presents for a Medicare Wellness preventive visit.  Visit Complete: Virtual I connected with  Veronica Fretz Sarr on 04/30/23 by a audio enabled telemedicine application and verified that I am speaking with the correct person using two identifiers.  Patient Location: Home  Provider Location: Home Office  I discussed the limitations of evaluation and management by telemedicine. The patient expressed understanding and agreed to proceed.  Vital Signs: Because this visit was a virtual/telehealth visit, some criteria may be missing or patient reported. Any vitals not documented were not able to be obtained and vitals that have been documented are patient reported.  VideoDeclined- This patient declined Librarian, academic. Therefore the visit was completed with audio only.  Persons Participating in Visit: Patient.  AWV Questionnaire: No: Patient Medicare AWV questionnaire was not completed prior to this visit.  Cardiac Risk Factors include: advanced age (>32men, >59 women);hypertension;diabetes mellitus;Other (see comment), Risk factor comments: OSA, COPD, CAD     Objective:    Today's Vitals   04/30/23 1344 04/30/23 1345  Weight: 258 lb (117 kg)   Height: 5\' 7"  (1.702 m)   PainSc:  8    Body mass index is 40.41 kg/m.     04/30/2023    1:58 PM 04/29/2022    3:37 PM 08/01/2021    1:15 AM 08/01/2021    1:00 AM 04/23/2021    1:58 PM 03/23/2021    6:25 AM 03/21/2021    2:18 PM  Advanced Directives  Does Patient Have a Medical Advance Directive? No Yes  No No No No  Type of Special educational needs teacher of [place];Living will       Copy of Healthcare Power of Attorney in Chart?  No - copy requested       Would patient like information on creating a medical advance directive? Yes (MAU/Ambulatory/Procedural Areas - Information given)  No - Patient declined  Yes (MAU/Ambulatory/Procedural  Areas - Information given) No - Patient declined No - Patient declined    Current Medications (verified) Outpatient Encounter Medications as of 04/30/2023  Medication Sig   acetaminophen (TYLENOL) 325 MG tablet Take 1-2 tablets (325-650 mg total) by mouth every 6 (six) hours as needed for mild pain (pain score 1-3 or temp > 100.5).   albuterol (VENTOLIN HFA) 108 (90 Base) MCG/ACT inhaler INHALE TWO puffs into THE lungs EVERY SIX HOURS AS NEEDED wheezind AND For SHORTNESS OF BREATH   B-D ULTRA-FINE 33 LANCETS MISC Use as instructed to test sugars once  daily   Blood Glucose Monitoring Suppl DEVI 1 each by Does not apply route in the morning, at noon, and at bedtime. May substitute to any manufacturer covered by patient's insurance.   Budesonide ER 9 MG TB24 Take 1 tablet (9 mg total) by mouth in the morning.   budesonide-formoterol (SYMBICORT) 80-4.5 MCG/ACT inhaler Inhale 2 puffs into the lungs 2 (two) times daily. INHALE TWO puffs into THE lungs TWICE DAILY   carvedilol (COREG) 12.5 MG tablet TAKE ONE TABLET BY MOUTH TWICE DAILY With meals   Cholecalciferol (VITAMIN D-3) 125 MCG (5000 UT) TABS Take 5,000 Units by mouth daily.   clotrimazole-betamethasone (LOTRISONE) cream Apply 1 Application topically 2 (two) times daily.   colesevelam (WELCHOL) 625 MG tablet Take 2 tablets (1,250 mg total) by mouth 2 (two) times daily with a meal.   Cyanocobalamin (VITAMIN B-12) 1000 MCG SUBL Place 1 tablet (1,000 mcg total) under the  tongue daily.   dexlansoprazole (DEXILANT) 60 MG capsule TAKE ONE CAPSULE BY MOUTH EVERY DAY   diphenoxylate-atropine (LOMOTIL) 2.5-0.025 MG tablet Take 1-2 tablets by mouth 4 (four) times daily as needed for diarrhea or loose stools.   erythromycin ophthalmic ointment Place into both eyes at bedtime.   gabapentin (NEURONTIN) 300 MG capsule TAKE 1 CAPSULE BY MOUTH EVERY MORNING. MAY TAKE SECOND CAPSULE DURING THE DAY AS NEEDED FOR PAIN   glucose blood (ACCU-CHEK GUIDE) test strip  Use to check blood sugar once a day.   Glucose Blood (BLOOD GLUCOSE TEST STRIPS) STRP Use to check blood sugars every day. May substitute to any manufacturer covered by patient's insurance.   HUMIRA PEN 40 MG/0.4ML PNKT Inject 40 mg as directed every Wednesday.    hydrOXYzine (ATARAX) 25 MG tablet TAKE ONE TABLET pop EVERY EIGHT HOURS AS NEEDED   Lancet Device MISC 1 each by Does not apply route in the morning, at noon, and at bedtime. May substitute to any manufacturer covered by patient's insurance.   Lancets Misc. MISC Use to check blood sugars every day. May substitute to any manufacturer covered by patient's insurance.   metFORMIN (GLUCOPHAGE-XR) 500 MG 24 hr tablet Take 1 tablet (500 mg total) by mouth daily with supper.   Multiple Vitamins-Minerals (MULTIVITAMIN WOMEN 50+) TABS Take 1 tablet by mouth daily.   naloxone (NARCAN) nasal spray 4 mg/0.1 mL Place 1 spray into the nose once as needed (overdose).   naltrexone (DEPADE) 50 MG tablet Take 50 mg by mouth daily.   OXYGEN Inhale 2 L into the lungs continuous.   polyvinyl alcohol (LIQUIFILM TEARS) 1.4 % ophthalmic solution Place 1 drop into both eyes as needed for dry eyes.   potassium chloride (KLOR-CON M) 10 MEQ tablet TAKE ONE TABLET BY MOUTH EVERY DAY   QUEtiapine (SEROQUEL XR) 200 MG 24 hr tablet Take 1 tablet (200 mg total) by mouth at bedtime. ** STOP 300mg  er tablet **   QUEtiapine (SEROQUEL) 50 MG tablet TAKE ONE TABLET BY MOUTH EVERY MORNING AND TAKE TWO TABLETS BY MOUTH AT BEDTIME   topiramate (TOPAMAX) 50 MG tablet TAKE ONE TABLET BY MOUTH TWICE DAILY   venlafaxine XR (EFFEXOR-XR) 75 MG 24 hr capsule TAKE ONE CAPSULE BY MOUTH EVERY DAY WITH breakfast   XARELTO 20 MG TABS tablet TAKE ONE TABLET BY MOUTH EVERY DAY   albuterol (PROVENTIL) (2.5 MG/3ML) 0.083% nebulizer solution Take 3 mLs (2.5 mg total) by nebulization every 6 (six) hours as needed for wheezing or shortness of breath.   No facility-administered encounter  medications on file as of 04/30/2023.    Allergies (verified) Belsomra [suvorexant], Enalapril maleate, Latex, Nickel, Other, Augmentin [amoxicillin-pot clavulanate], Fentanyl, Morphine and codeine, Doxycycline, Hydrochlorothiazide, Hydroxychloroquine sulfate, Lyrica [pregabalin], and Tape   History: Past Medical History:  Diagnosis Date   ALLERGIC RHINITIS 10/26/2009   no current problems   Anxiety    B12 DEFICIENCY 08/25/2007   takes B12 supplement   Complication of anesthesia    pt has had difficulty following anesthesia with her knee in 2016-unable to care for herself afterward   Depression    takes effexor xr daily   Diabetes mellitus without complication (HCC)    was on insulin but has been off since Nov 2015 and now only takes Metformin daily   DYSPNEA 04/28/2009   uses oxygen 24/7, on 2L via    Esophageal reflux    takes Nexium daily   Fibromyalgia    Headache  last migraine 2-29yrs ago;takes Topamax daily   History of shingles    Hypertension    takes Coreg daily   Insomnia    takes Nortriptyline nightly    Long-term memory loss    OSA (obstructive sleep apnea)    doesn't use CPAP;sleep study in epic from 2006   Osteoarthritis    Panic attacks    PONV (postoperative nausea and vomiting)    on time in 2016 knee surgery   Rheumatoid arthritis (HCC)    Sarcoidosis    Dr. Wilford Grist   Stroke [facility])    Vitamin D deficiency    is supposed to take Vit D but can't afford it   Past Surgical History:  Procedure Laterality Date   APPENDECTOMY     arthroscopic knee surgery Right 11-12-04   AXILLARY ABCESS IRRIGATION AND DEBRIDEMENT  Jul & [id number]   BIOPSY  11/02/2019   Procedure: BIOPSY;  Surgeon: Beverley Fiedler, MD;  Location: [facility];  Service: Gastroenterology;;   CARPAL TUNNEL RELEASE Left 05/23/2014   Procedure: CARPAL TUNNEL RELEASE;  Surgeon: Cammy Copa, MD;  Location: [facility] OR;  Service: Orthopedics;  Laterality: Left;   CHOLECYSTECTOMY N/A 02/11/2018    Procedure: LAPAROSCOPIC CHOLECYSTECTOMY WITH INTRAOPERATIVE CHOLANGIOGRAM ERAS PATHWAY;  Surgeon: Manus Rudd, MD;  Location[facility] OR;  Service: General;  Laterality: N/A;   COLONOSCOPY WITH PROPOFOL N/A 11/02/2019   Procedure: COLONOSCOPY WITH PROPOFOL;  Surgeon: Beverley Fiedler, MD;  Location: [facility];  Service: Gastroenterology;  Laterality: N/A;   cyst removed from top of buttocks  at age 10   DACRORHINOCYSTOTOMY Bilateral 03/23/2021   Procedure: ENDOSCOPIC DACROCYSTORHINOSTOMY;  Surgeon: Dairl Ponder, MD;  Location: [facility] OR;  Service: Ophthalmology;  Laterality: Bilateral;   ENDOMETRIAL ABLATION     IR CHOLANGIOGRAM EXISTING TUBE  11/21/2017   IR CHOLANGIOGRAM EXISTING TUBE  01/16/2018   IR EXCHANGE BILIARY DRAIN  11/10/2017   IR PATIENT EVAL TECH 0-60 MINS  02/03/2018   IR PERC CHOLECYSTOSTOMY  09/13/2017   IR RADIOLOGIST EVAL & MGMT  10/08/2017   LACRIMAL DUCT EXPLORATION Right 06/26/2017   Procedure: LACRIMAL DUCT EXPLORATION AND ETHMOIDECTOMY;  Surgeon: Floydene Flock, MD;  Location: [facility] OR;  Service: Ophthalmology;  Laterality: Right;   LACRIMAL DUCT EXPLORATION Bilateral 03/23/2021   Procedure: NASAL LACRIMAL DUCT EXPLORATION;  Surgeon: Dairl Ponder, MD;  Location: [facility] OR;  Service: Ophthalmology;  Laterality: Bilateral;   LACRIMAL TUBE INSERTION Bilateral 03/23/2021   Procedure: INSERTION OF CRAWFORD LACRIMAL STENT;  Surgeon: Dairl Ponder, MD;  Location: [facility] OR;  Service: Ophthalmology;  Laterality: Bilateral;   ORIF FEMUR FRACTURE Left 07/31/2021   Procedure: left medial femoral condyle fracture fixation;  Surgeon: Cammy Copa, MD;  Location: [facility] OR;  Service: Orthopedics;  Laterality: Left;   POLYPECTOMY  11/02/2019   Procedure: POLYPECTOMY;  Surgeon: Beverley Fiedler, MD;  Location: [facility];  Service: Gastroenterology;;   TEAR DUCT PROBING Right 06/26/2017   Procedure: TEAR DUCT PROBING WITH STENT;  Surgeon: Floydene Flock, MD;  Location: [facility] OR;  Service:  Ophthalmology;  Laterality: Right;   TOTAL KNEE ARTHROPLASTY Right 11/15/2014   Procedure: TOTAL RIGHT KNEE ARTHROPLASTY;  Surgeon: Cammy Copa, MD;  Location: [facility] OR;  Service: Orthopedics;  Laterality: Right;   TOTAL KNEE ARTHROPLASTY Left 07/13/2015   Procedure: LEFT TOTAL KNEE ARTHROPLASTY;  Surgeon: Cammy Copa, MD;  Location: [facility] OR;  Service: Orthopedics;  Laterality: Left;   Family History  Problem Relation Age of Onset  Heart disease Mother    Kidney disease Mother        renal failure   Cancer Father        leukemia   Hypertension Other    Coronary artery disease Other        female 1st degree relative <60   Heart failure Other        congestive   Coronary artery disease Other        Female 1st degree relative <50   Breast cancer Other        1st degree relative <50 S   Breast cancer Sister    Colon cancer Brother 60   Stroke Brother    Heart attack Brother    Cancer Brother 73       colon ca   Esophageal cancer Neg Hx    Stomach cancer Neg Hx    Rectal cancer Neg Hx    Social History   Socioeconomic History   Marital status: Single    Spouse name: Not on file   Number of children: 1   Years of education: Not on file   Highest education level: 12th grade  Occupational History   Occupation: disabled  Tobacco Use   Smoking status: Former    Current packs/day: 0.00    Average packs/day: 0.5 packs/day for 16.0 years (8.0 ttl pk-yrs)    Types: Cigarettes    Start date: 34    Quit date: 2004    Years since quitting: 21.3   Smokeless tobacco: Never   Tobacco comments:    quit smoking in 2004  Vaping Use   Vaping status: Never Used  Substance and Sexual Activity   Alcohol use: Not Currently    Alcohol/week: 1.0 standard drink of alcohol    Types: 1 Glasses of wine per week   Drug use: No   Sexual activity: Not Currently    Birth control/protection: Post-menopausal  Other Topics Concern   Not on file  Social History Narrative   Single,  broke up with partner in 2008.      Lives at home alone/2025   Caffeine: stopped 2009   Social Drivers of Health   Financial Resource Strain: Low Risk  (04/30/2023)   Overall Financial Resource Strain (CARDIA)    Difficulty of Paying Living Expenses: Not very hard  Food Insecurity: No Food Insecurity (04/30/2023)   Hunger Vital Sign    Worried About Running Out of Food in the Last Year: Never true    Ran Out of Food in the Last Year: Never true  Transportation Needs: No Transportation Needs (04/30/2023)   PRAPARE - Administrator, Civil Service (Medical): No    Lack of Transportation (Non-Medical): No  Physical Activity: Inactive (04/30/2023)   Exercise Vital Sign    Days of Exercise per Week: 0 days    Minutes of Exercise per Session: 0 min  Stress: Stress Concern Present (04/30/2023)   [employer] of Occupational Health - Occupational Stress Questionnaire    Feeling of Stress : Very much  Social Connections: Socially Isolated (04/30/2023)   Social Connection and Isolation Panel [NHANES]    Frequency of Communication with Friends and Family: Twice a week    Frequency of Social Gatherings with Friends and Family: Never    Attends Religious Services: Never    Database administrator or Organizations: No    Attends Banker Meetings: Never    Marital Status: Never married    Tobacco  Counseling Counseling given: Not Answered Tobacco comments: quit smoking in 2004    Clinical Intake:  Pre-visit preparation completed: Yes  Pain : 0-10 Pain Score: 8  (in pain everyday) Pain Type: Chronic pain Pain Location: Ankle (Lt arm) Pain Orientation: Left Pain Descriptors / Indicators: Aching, Discomfort Pain Onset:  (ankle pain x 3 months/Lt arm x 1 month) Pain Frequency: Constant Pain Relieving Factors: Tylenol  Pain Relieving Factors: Tylenol  BMI - recorded: 40.41 Nutritional Status: BMI > 30  Obese Nutritional Risks: Nausea/ vomitting/ diarrhea  (diarrhea) Diabetes: Yes CBG done?: Yes (105 per pt) CBG resulted in Enter/ Edit results?: No Did pt. bring in CBG monitor from home?: No  Lab Results  Component Value Date   HGBA1C 5.4 04/21/2023   HGBA1C 6.0 04/09/2022   HGBA1C 5.1 03/21/2021     How often do you need to have someone help you when you read instructions, pamphlets, or other written materials from your doctor or pharmacy?: 1 - Never  Interpreter Needed?: No  Information entered by :: Isebella Upshur, RMA   Activities of Daily Living     04/30/2023    1:52 PM  In your present state of health, do you have any difficulty performing the following activities:  Hearing? 1  Comment wears hearing aides  Vision? 0  Difficulty concentrating or making decisions? 0  Walking or climbing stairs? 0  Dressing or bathing? 0  Doing errands, shopping? 0  Comment Materials engineer and eating ? N  Using the Toilet? N  In the past six months, have you accidently leaked urine? Y  Do you have problems with loss of bowel control? Y  Managing your Medications? N  Managing your Finances? N  Housekeeping or managing your Housekeeping? N    Patient Care Team: Plotnikov, Georgina Quint, MD as PCP - General Wendall Stade, MD as PCP - Cardiology (Cardiology) Romero Belling, MD (Inactive) as Attending Physician (Internal Medicine) August Saucer, Corrie Mckusick, MD (Orthopedic Surgery) Pollyann Savoy, MD as Consulting Physician (Rheumatology) Ardell Isaacs, MD (Pain Medicine) Pollyann Savoy, MD as Consulting Physician (Rheumatology) Venancio Poisson, MD as Consulting Physician (Dermatology) Oretha Milch, MD as Consulting Physician (Pulmonary Disease) Wendall Stade, MD as Consulting Physician (Cardiology) Leonette Nutting, MD as Referring Physician (Dermatology) Anson Fret, MD as Consulting Physician (Neurology) Soundra Pilon, LCSW as Social Worker (Licensed Clinical Social Worker) [facility] Sallye Lat, MD as Consulting Physician (Ophthalmology)  Indicate any recent Medical Services you may have received from other than [facility] providers in the past year (date may be approximate).     Assessment:   This is a routine wellness examination for [place].  Hearing/Vision screen Hearing Screening - Comments:: wears hearing aides Vision Screening - Comments:: Wear eyeglasses   Goals Addressed             This Visit's Progress    Patient Stated   On track    No new goals        Depression Screen     04/30/2023    2:05 PM 08/26/2022    1:32 PM 07/10/2022    1:35 PM 06/07/2022    2:29 PM 04/29/2022    3:35 PM 03/12/2022    1:36 PM 01/11/2022    1:31 PM  PHQ 2/9 Scores  PHQ - 2 Score 6 4 0 0 0 0 0  PHQ- 9 Score 19 22 0 0 0 0 0    Fall Risk  04/30/2023    2:01 PM 10/29/2022    4:09 PM 08/26/2022    1:32 PM 07/10/2022    1:35 PM 06/07/2022    2:29 PM  Fall Risk   Falls in the past year? 1 1 1 1 1   Number falls in past yr: 0 1 0 0 0  Injury with Fall? 1 1 1  0 0  Comment sprang lt ankle and lt wrist      Risk for fall due to :  Impaired balance/gait;Impaired mobility;History of fall(s) History of fall(s);Impaired balance/gait No Fall Risks   Follow up Falls evaluation completed;Falls prevention discussed Falls evaluation completed Falls evaluation completed Falls evaluation completed Falls evaluation completed    MEDICARE RISK AT HOME:  Medicare Risk at Home Any stairs in or around the home?: No If so, are there any without handrails?: No Home free of loose throw rugs in walkways, pet beds, electrical cords, etc?: Yes Adequate lighting in your home to reduce risk of falls?: Yes Life alert?: No (been trying to get one) Use of a cane, walker or w/c?: Yes (both cane and walker) Grab bars in the bathroom?: Yes Shower chair or bench in shower?: Yes (need another shawer chair, last one broke) Elevated toilet seat or a handicapped toilet?:  Yes  TIMED UP AND GO:  Was the test performed?  No  Cognitive Function: 6CIT completed    11/06/2016   10:31 AM  MMSE - Mini Mental State Exam  Orientation to time 5  Orientation to Place 5  Registration 3  Attention/ Calculation 3  Recall 1  Language- name 2 objects 2  Language- repeat 1  Language- follow 3 step command 3  Language- read & follow direction 1  Write a sentence 1  Copy design 1  Total score 26        04/29/2022    3:47 PM 04/23/2021    2:20 PM  6CIT Screen  What Year? 0 points 0 points  What month? 0 points 0 points  What time? 0 points 0 points  Count back from 20 0 points 0 points  Months in reverse 0 points 0 points  Repeat phrase 0 points 0 points  Total Score 0 points 0 points    Immunizations Immunization History  Administered Date(s) Administered   Fluad Quad(high Dose 65+) 10/15/2021   Fluad Trivalent(High Dose 65+) 09/30/2022   H1N1 12/24/2007   Influenza Split 10/11/2010   Influenza Whole 10/14/2007, 10/05/2008, 10/26/2009, 11/13/2011   Influenza,inj,Quad PF,6+ Mos 09/21/2012, 09/24/2013, 10/06/2014, 09/05/2015, 11/06/2016, 09/24/2017, 10/07/2018, 11/15/2019, 10/13/2020   Influenza,inj,Quad PF,6-35 Mos 09/14/2020   PFIZER(Purple Top)SARS-COV-2 Vaccination 03/31/2019, 04/21/2019, 11/27/2019   PNEUMOCOCCAL CONJUGATE-20 07/19/2020   PPD Test 11/18/2014   Pneumococcal Conjugate-13 07/30/2010, 12/29/2012   Pneumococcal Polysaccharide-23 12/23/2013   Tdap 06/28/2014    Screening Tests Health Maintenance  Topic Date Due   Cervical Cancer Screening (HPV/Pap Cotest)  Never done   COVID-19 Vaccine (4 - 2024-25 season) 09/15/2022   Diabetic kidney evaluation - Urine ACR  04/09/2023   Medicare Annual Wellness (AWV)  04/29/2023   INFLUENZA VACCINE  08/15/2023   HEMOGLOBIN A1C  10/21/2023   OPHTHALMOLOGY EXAM  11/19/2023   Diabetic kidney evaluation - eGFR measurement  03/09/2024   FOOT EXAM  03/23/2024   MAMMOGRAM  04/25/2024    DTaP/Tdap/Td (2 - Td or Tdap) 06/27/2024   Colonoscopy  11/01/2029   Pneumococcal Vaccine 67-14 Years old  Completed   Hepatitis C Screening  Completed   HIV Screening  Completed   HPV VACCINES  Aged Out   Meningococcal B Vaccine  Aged Out   Zoster Vaccines- Shingrix  Discontinued    Health Maintenance  Health Maintenance Due  Topic Date Due   Cervical Cancer Screening (HPV/Pap Cotest)  Never done   COVID-19 Vaccine (4 - 2024-25 season) 09/15/2022   Diabetic kidney evaluation - Urine ACR  04/09/2023   Medicare Annual Wellness (AWV)  04/29/2023   Health Maintenance Items Addressed: See Nurse Notes  Additional Screening:  Vision Screening: Recommended annual ophthalmology exams for early detection of glaucoma and other disorders of the eye.  Dental Screening: Recommended annual dental exams for proper oral hygiene  Community Resource Referral / Chronic Care Management: CRR required this visit?  No   CCM required this visit?  No     Plan:     I have personally reviewed and noted the following in the patient's chart:   Medical and social history Use of alcohol, tobacco or illicit drugs  Current medications and supplements including opioid prescriptions. Patient is not currently taking opioid prescriptions. Functional ability and status Nutritional status Physical activity Advanced directives List of other physicians Hospitalizations, surgeries, and ER visits in previous 12 months Vitals Screenings to include cognitive, depression, and falls Referrals and appointments  In addition, I have reviewed and discussed with patient certain preventive protocols, quality metrics, and best practice recommendations. A written personalized care plan for preventive services as well as general preventive health recommendations were provided to patient.     Xaria Judon L Abdias Hickam, CMA   04/30/2023   After Visit Summary: (MyChart) Due to this being a telephonic visit, the after visit  summary with patients personalized plan was offered to patient via MyChart   Notes: Please refer to Routing Comments.

## 2023-04-30 NOTE — Patient Instructions (Addendum)
 [name] , Thank you for taking time to come for your Medicare Wellness Visit. I appreciate your ongoing commitment to your health goals. Please review the following plan we discussed and let me know if I can assist you in the future.   Referrals/Orders/Follow-Ups/Clinician Recommendations: It was nice talking with you today.  You are due for a Shingles vaccine.  Remember to discuss a Rolator and get packet for Advance directives during your next visit.  This is a list of the screening recommended for you and due dates:  Health Maintenance  Topic Date Due   Pap with HPV screening  Never done   COVID-19 Vaccine (4 - 2024-25 season) 09/15/2022   Yearly kidney health urinalysis for diabetes  04/09/2023   Flu Shot  08/15/2023   Hemoglobin A1C  10/21/2023   Eye exam for diabetics  11/19/2023   Yearly kidney function blood test for diabetes  03/09/2024   Complete foot exam   03/23/2024   Mammogram  04/25/2024   Medicare Annual Wellness Visit  04/29/2024   DTaP/Tdap/Td vaccine (2 - Td or Tdap) 06/27/2024   Colon Cancer Screening  11/01/2029   Pneumococcal Vaccination  Completed   Hepatitis C Screening  Completed   HIV Screening  Completed   HPV Vaccine  Aged Out   Meningitis B Vaccine  Aged Out   Zoster (Shingles) Vaccine  Discontinued    Advanced directives: (Provided) Advance directive discussed with you today. I have provided a copy for you to complete at home and have notarized. Once this is complete, please bring a copy in to our office so we can scan it into your chart.   Next Medicare Annual Wellness Visit scheduled for next year: Yes

## 2023-05-05 ENCOUNTER — Encounter: Payer: Self-pay | Admitting: Internal Medicine

## 2023-05-05 ENCOUNTER — Ambulatory Visit (INDEPENDENT_AMBULATORY_CARE_PROVIDER_SITE_OTHER): Payer: 59 | Admitting: Internal Medicine

## 2023-05-05 VITALS — BP 130/74 | HR 71 | Temp 98.0°F | Ht 67.0 in | Wt 258.0 lb

## 2023-05-05 DIAGNOSIS — R197 Diarrhea, unspecified: Secondary | ICD-10-CM | POA: Diagnosis not present

## 2023-05-05 DIAGNOSIS — E538 Deficiency of other specified B group vitamins: Secondary | ICD-10-CM

## 2023-05-05 DIAGNOSIS — E1122 Type 2 diabetes mellitus with diabetic chronic kidney disease: Secondary | ICD-10-CM

## 2023-05-05 DIAGNOSIS — D869 Sarcoidosis, unspecified: Secondary | ICD-10-CM | POA: Diagnosis not present

## 2023-05-05 DIAGNOSIS — N182 Chronic kidney disease, stage 2 (mild): Secondary | ICD-10-CM

## 2023-05-05 DIAGNOSIS — E559 Vitamin D deficiency, unspecified: Secondary | ICD-10-CM

## 2023-05-05 DIAGNOSIS — J3489 Other specified disorders of nose and nasal sinuses: Secondary | ICD-10-CM

## 2023-05-05 DIAGNOSIS — F4320 Adjustment disorder, unspecified: Secondary | ICD-10-CM

## 2023-05-05 DIAGNOSIS — E109 Type 1 diabetes mellitus without complications: Secondary | ICD-10-CM

## 2023-05-05 DIAGNOSIS — I2510 Atherosclerotic heart disease of native coronary artery without angina pectoris: Secondary | ICD-10-CM | POA: Diagnosis not present

## 2023-05-05 DIAGNOSIS — Z7984 Long term (current) use of oral hypoglycemic drugs: Secondary | ICD-10-CM

## 2023-05-05 DIAGNOSIS — F419 Anxiety disorder, unspecified: Secondary | ICD-10-CM

## 2023-05-05 DIAGNOSIS — R269 Unspecified abnormalities of gait and mobility: Secondary | ICD-10-CM

## 2023-05-05 DIAGNOSIS — F339 Major depressive disorder, recurrent, unspecified: Secondary | ICD-10-CM

## 2023-05-05 LAB — CBC WITH DIFFERENTIAL/PLATELET
Basophils Absolute: 0.1 10*3/uL (ref 0.0–0.1)
Basophils Relative: 0.7 % (ref 0.0–3.0)
Eosinophils Absolute: 0.3 10*3/uL (ref 0.0–0.7)
Eosinophils Relative: 4 % (ref 0.0–5.0)
HCT: 40 % (ref 36.0–46.0)
Hemoglobin: 13.1 g/dL (ref 12.0–15.0)
Lymphocytes Relative: 30.5 % (ref 12.0–46.0)
Lymphs Abs: 2.6 10*3/uL (ref 0.7–4.0)
MCHC: 32.6 g/dL (ref 30.0–36.0)
MCV: 101.1 fl — ABNORMAL HIGH (ref 78.0–100.0)
Monocytes Absolute: 0.7 10*3/uL (ref 0.1–1.0)
Monocytes Relative: 8.1 % (ref 3.0–12.0)
Neutro Abs: 4.8 10*3/uL (ref 1.4–7.7)
Neutrophils Relative %: 56.7 % (ref 43.0–77.0)
Platelets: 225 10*3/uL (ref 150.0–400.0)
RBC: 3.96 Mil/uL (ref 3.87–5.11)
RDW: 13.2 % (ref 11.5–15.5)
WBC: 8.5 10*3/uL (ref 4.0–10.5)

## 2023-05-05 LAB — COMPREHENSIVE METABOLIC PANEL WITH GFR
ALT: 24 U/L (ref 0–35)
AST: 20 U/L (ref 0–37)
Albumin: 3.8 g/dL (ref 3.5–5.2)
Alkaline Phosphatase: 77 U/L (ref 39–117)
BUN: 28 mg/dL — ABNORMAL HIGH (ref 6–23)
CO2: 29 meq/L (ref 19–32)
Calcium: 9.4 mg/dL (ref 8.4–10.5)
Chloride: 104 meq/L (ref 96–112)
Creatinine, Ser: 0.92 mg/dL (ref 0.40–1.20)
GFR: 66.61 mL/min (ref >60.00)
Glucose, Bld: 90 mg/dL (ref 70–99)
Potassium: 4.1 meq/L (ref 3.5–5.1)
Sodium: 140 meq/L (ref 135–145)
Total Bilirubin: 0.3 mg/dL (ref 0.2–1.2)
Total Protein: 7.8 g/dL (ref 6.0–8.3)

## 2023-05-05 LAB — TSH: TSH: 1.1 u[IU]/mL (ref 0.35–5.50)

## 2023-05-05 MED ORDER — TOBRAMYCIN-DEXAMETHASONE 0.3-0.1 % OP SUSP: 1.0000 [drp] | Freq: Four times a day (QID) | OPHTHALMIC | 0 refills | Status: DC

## 2023-05-05 MED ORDER — SULFAMETHOXAZOLE-TRIMETHOPRIM 800-160 MG PO TABS: 1.0000 | ORAL_TABLET | Freq: Two times a day (BID) | ORAL | 1 refills | Status: DC

## 2023-05-05 NOTE — Assessment & Plan Note (Signed)
 Off  Seroquel

## 2023-05-05 NOTE — Assessment & Plan Note (Signed)
 Aunt died in [place] - grieving

## 2023-05-05 NOTE — Progress Notes (Signed)
 Subjective:  Patient ID: [name], female    DOB: 06-05-1960  Age: 63 y.o. MRN: [id number]  CC: Medical Management of Chronic Issues (3 Month Follow Up. Patient experiencing some nasal drip (patient notes sputum is hard most of the time), rash upon top lip, left eye redness, mouth pain, bruising upon the bridge of nose, bruising and rash on left ear. Patient also present with discolored spots on left and right arm. Currently treating rashes with aquaphor)   HPI [name] presents for DM, sarcoid, diarrhea H/o pulm sarcoidosis On Humira x months for lichen - not working per Moira Andrews  Outpatient Medications Prior to Visit  Medication Sig Dispense Refill   acetaminophen  (TYLENOL ) 325 MG tablet Take 1-2 tablets (325-650 mg total) by mouth every 6 (six) hours as needed for mild pain (pain score 1-3 or temp > 100.5). 30 tablet 1   albuterol  (VENTOLIN  HFA) 108 (90 Base) MCG/ACT inhaler INHALE TWO puffs into THE lungs EVERY SIX HOURS AS NEEDED wheezind AND For SHORTNESS OF BREATH 6.7 g 3   B-D ULTRA-FINE 33 LANCETS MISC Use as instructed to test sugars once  daily 100 each 12   Blood Glucose Monitoring Suppl DEVI 1 each by Does not apply route in the morning, at noon, and at bedtime. May substitute to any manufacturer covered by patient's insurance. 1 each 0   budesonide -formoterol  (SYMBICORT ) 80-4.5 MCG/ACT inhaler Inhale 2 puffs into the lungs 2 (two) times daily. INHALE TWO puffs into THE lungs TWICE DAILY 1 each 12   carvedilol  (COREG ) 12.5 MG tablet TAKE ONE TABLET BY MOUTH TWICE DAILY With meals 180 tablet 3   Cholecalciferol  (VITAMIN D -3) 125 MCG (5000 UT) TABS Take 5,000 Units by mouth daily.     clotrimazole -betamethasone  (LOTRISONE ) cream Apply 1 Application topically 2 (two) times daily. 90 g 1   colesevelam  (WELCHOL ) 625 MG tablet Take 2 tablets (1,250 mg total) by mouth 2 (two) times daily with a meal. 120 tablet 1   Cyanocobalamin  (VITAMIN B-12) 1000 MCG  SUBL Place 1 tablet (1,000 mcg total) under the tongue daily. 100 tablet 3   dexlansoprazole  (DEXILANT ) 60 MG capsule TAKE ONE CAPSULE BY MOUTH EVERY DAY 90 capsule 1   diphenoxylate -atropine  (LOMOTIL ) 2.5-0.025 MG tablet Take 1-2 tablets by mouth 4 (four) times daily as needed for diarrhea or loose stools. 60 tablet 1   gabapentin  (NEURONTIN ) 300 MG capsule TAKE 1 CAPSULE BY MOUTH EVERY MORNING. MAY TAKE SECOND CAPSULE DURING THE DAY AS NEEDED FOR PAIN 180 capsule 1   glucose blood (ACCU-CHEK GUIDE) test strip Use to check blood sugar once a day. 100 each 12   Glucose Blood (BLOOD GLUCOSE TEST STRIPS) STRP Use to check blood sugars every day. May substitute to any manufacturer covered by patient's insurance. 100 each 3   HUMIRA PEN 40 MG/0.4ML PNKT Inject 40 mg as directed every Wednesday.      hydrOXYzine  (ATARAX ) 25 MG tablet TAKE ONE TABLET pop EVERY EIGHT HOURS AS NEEDED 60 tablet 3   Lancet Device MISC 1 each by Does not apply route in the morning, at noon, and at bedtime. May substitute to any manufacturer covered by patient's insurance. 1 each 0   Lancets Misc. MISC Use to check blood sugars every day. May substitute to any manufacturer covered by patient's insurance. 100 each 3   metFORMIN  (GLUCOPHAGE -XR) 500 MG 24 hr tablet Take 1 tablet (500 mg total) by mouth daily with supper. 90 tablet 3   Multiple Vitamins-Minerals (  MULTIVITAMIN WOMEN 50+) TABS Take 1 tablet by mouth daily.     naloxone  (NARCAN ) nasal spray 4 mg/0.1 mL Place 1 spray into the nose once as needed (overdose).     naltrexone (DEPADE) 50 MG tablet Take 50 mg by mouth daily.     OXYGEN  Inhale 2 L into the lungs continuous.     potassium chloride  (KLOR-CON  M) 10 MEQ tablet TAKE ONE TABLET BY MOUTH EVERY DAY 90 tablet 1   QUEtiapine  (SEROQUEL  XR) 200 MG 24 hr tablet Take 1 tablet (200 mg total) by mouth at bedtime. ** STOP 300mg  er tablet ** 30 tablet 5   QUEtiapine  (SEROQUEL ) 50 MG tablet TAKE ONE TABLET BY MOUTH EVERY  MORNING AND TAKE TWO TABLETS BY MOUTH AT BEDTIME 90 tablet 5   topiramate  (TOPAMAX ) 50 MG tablet TAKE ONE TABLET BY MOUTH TWICE DAILY 180 tablet 1   venlafaxine  XR (EFFEXOR -XR) 75 MG 24 hr capsule TAKE ONE CAPSULE BY MOUTH EVERY DAY WITH breakfast 30 capsule 11   XARELTO  20 MG TABS tablet TAKE ONE TABLET BY MOUTH EVERY DAY 90 tablet 3   albuterol  (PROVENTIL ) (2.5 MG/3ML) 0.083% nebulizer solution Take 3 mLs (2.5 mg total) by nebulization every 6 (six) hours as needed for wheezing or shortness of breath. 75 mL 5   Budesonide  ER 9 MG TB24 Take 1 tablet (9 mg total) by mouth in the morning. 30 tablet 5   polyvinyl alcohol  (LIQUIFILM TEARS) 1.4 % ophthalmic solution Place 1 drop into both eyes as needed for dry eyes. 15 mL 0   erythromycin  ophthalmic ointment Place into both eyes at bedtime. 3.5 g 2   No facility-administered medications prior to visit.    ROS: Review of Systems  Constitutional:  Negative for activity change, appetite change, chills, fatigue and unexpected weight change.  HENT:  Positive for congestion and sneezing. Negative for mouth sores, sinus pressure, sinus pain, sore throat and trouble swallowing.   Eyes:  Negative for visual disturbance.  Respiratory:  Positive for cough. Negative for chest tightness and wheezing.   Gastrointestinal:  Positive for diarrhea. Negative for abdominal pain and nausea.  Genitourinary:  Negative for difficulty urinating, frequency and vaginal pain.  Musculoskeletal:  Positive for arthralgias, back pain and gait problem.  Skin:  Negative for pallor and rash.  Neurological:  Positive for weakness. Negative for dizziness, tremors, numbness and headaches.  Psychiatric/Behavioral:  Negative for confusion, sleep disturbance and suicidal ideas.     Objective:  BP 130/74   Pulse 71   Temp 98 F (36.7 C)   Ht 5\' 7"  (1.702 m)   Wt 258 lb (117 kg)   LMP 05/19/2003   SpO2 97%   BMI 40.41 kg/m   BP Readings from Last 3 Encounters:  05/05/23  130/74  04/21/23 130/80  03/10/23 118/68    Wt Readings from Last 3 Encounters:  05/05/23 258 lb (117 kg)  04/30/23 258 lb (117 kg)  04/21/23 258 lb 12.8 oz (117.4 kg)    Physical Exam Constitutional:      General: She is not in acute distress.    Appearance: Normal appearance. She is well-developed. She is obese. She is not ill-appearing.  HENT:     Head: Normocephalic.     Right Ear: External ear normal.     Left Ear: External ear normal.     Nose: Nose normal.  Eyes:     General:        Right eye: No discharge.  Left eye: No discharge.     Conjunctiva/sclera: Conjunctivae normal.     Pupils: Pupils are equal, round, and reactive to light.  Neck:     Thyroid : No thyromegaly.     Vascular: No JVD.     Trachea: No tracheal deviation.  Cardiovascular:     Rate and Rhythm: Normal rate and regular rhythm.     Heart sounds: Normal heart sounds.  Pulmonary:     Effort: No respiratory distress.     Breath sounds: No stridor. No wheezing.  Abdominal:     General: Bowel sounds are normal. There is no distension.     Palpations: Abdomen is soft. There is no mass.     Tenderness: There is no abdominal tenderness. There is no guarding or rebound.  Musculoskeletal:        General: No tenderness.     Cervical back: Normal range of motion and neck supple. No rigidity.     Right lower leg: No edema.     Left lower leg: No edema.  Lymphadenopathy:     Cervical: No cervical adenopathy.  Skin:    Findings: No erythema or rash.  Neurological:     Cranial Nerves: No cranial nerve deficit.     Motor: No abnormal muscle tone.     Coordination: Coordination normal.     Deep Tendon Reflexes: Reflexes normal.  Psychiatric:        Behavior: Behavior normal.        Thought Content: Thought content normal.        Judgment: Judgment normal.   Using a walker LS, knees w/pain Painful to palpation face/nose Thick rash under the nose, L ear - ?sarcoid  Lab Results  Component  Value Date   WBC 9.5 03/10/2023   HGB 13.0 03/10/2023   HCT 39.6 03/10/2023   PLT 200.0 03/10/2023   GLUCOSE 106 (H) 03/10/2023   CHOL 196 04/21/2023   TRIG 137 04/21/2023   HDL 86 04/21/2023   LDLDIRECT 107.0 06/30/2017   LDLCALC 86 04/21/2023   ALT 29 03/10/2023   AST 33 03/10/2023   NA 139 03/10/2023   K 4.1 03/10/2023   CL 102 03/10/2023   CREATININE 0.89 03/10/2023   BUN 22 03/10/2023   CO2 27 03/10/2023   TSH 1.18 07/02/2021   INR 1.00 02/11/2018   HGBA1C 5.4 04/21/2023   MICROALBUR <0.7 04/09/2022    CT Chest Wo Contrast Result Date: 08/23/2022 CLINICAL DATA:  Follow-up left lower lobe process. EXAM: CT CHEST WITHOUT CONTRAST TECHNIQUE: Multidetector CT imaging of the chest was performed following the standard protocol without IV contrast. RADIATION DOSE REDUCTION: This exam was performed according to the departmental dose-optimization program which includes automated exposure control, adjustment of the mA and/or kV according to patient size and/or use of iterative reconstruction technique. COMPARISON:  Chest CT 06/06/2022 FINDINGS: Cardiovascular: The heart is normal in size. No pericardial effusion. Stable age advanced atherosclerotic calcifications involving the aorta and coronary arteries. Mediastinum/Nodes: Stable extensive partially calcified mediastinal and hilar lymphadenopathy consistent with known sarcoidosis. The esophagus is grossly normal. 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 The superior segment left lower lobe process has improved and is likely resolving atelectasis. This area measures 3.1 x 1.2 cm and previously measured 5.0 x 2.2 cm. No worrisome pulmonary lesions or nodules.  No pleural effusions. Upper Abdomen: No significant upper abdominal findings. Stable aortic calcifications. Musculoskeletal: No significant bony findings. IMPRESSION: 1. Stable extensive partially calcified mediastinal and hilar lymphadenopathy  consistent  with known sarcoidosis. 2. 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 3. The superior segment left lower lobe process has improved and is likely resolving atelectasis. Recommend follow-up noncontrast chest CT in 3-6 months 4. No worrisome pulmonary lesions or nodules. Aortic Atherosclerosis (ICD10-I70.0). Electronically Signed   By: Marrian Siva M.D.   On: 08/23/2022 08:56    Assessment & Plan:   Problem List Items Addressed This Visit     Sarcoidosis   F/u w/Christopher Jenean Minus, MD - 10/16/2020 1:00 PM EDT Formatting of this note is different from the original. Images from the original note were not included.  Dermatology Note  Assessment and Plan:  Lichen planus - Diagnosis, treatment options, prognosis, risk/ benefit, and side effects of treatment were discussed with the patient. - Start halobetasol 0.05% ointment BID x 2 weeks. Discussed using occlusion with compress or plastic wrap x3-5 days when lesions flare.   Intralesional Kenalog  Procedure Note: After the patient was informed of risks (including atrophy and dyspigmentation), benefits and side effects of intralesional steroid injection, the patient elected to undergo injection and verbal consent was obtained. Skin was cleaned with alcohol  and injected intradermally into the sites (below). The patient tolerated the procedure well without complications and was instructed on post-procedure care. Location(s): bilateral Number of sites treated: 5 Kenalog  (triamcinolone ) Concentration: 10 mg/ml Volume: 0.4 ml total  Sarcoidosis, cutaneous and pulmonary - Dr. Villa Greaser (pulmonary) in the loop, continue adalimumab 40mg  weekly (started eow 06/2016, increased to weekly 09/2016). Next follow-up with pulmonology on 01/25/2021. - Has had ophtho screening in the past, next ophtho appointment scheduled for 10/20/2020. - Quant gold negative on 05/2019; print out of new order provided today for patient to obtain at next internal  medicine visit on 11/20/2020   Pruritus  - Continue hydroxyzine  25 mg tablet take 1/4 in the morning and 1/2 in the evening as needed.   - Continue naltrexone 12.5mg  (quartering 50mg  tablets) once daily  The patient was advised to call for an appointment should any new, changing, or symptomatic lesions develop.  RTC: Return in about 3 months (around 01/16/2021). or sooner as needed       Relevant Orders   Comprehensive metabolic panel with GFR   CBC with Differential/Platelet   TSH   B12 deficiency   On B12      Vitamin D  deficiency   On Vit D      Anxiety disorder   Off  Seroquel       Grief reaction   Aunt died in [place] - grieving      Major depressive disorder, recurrent episode (HCC)   On Seroquel .   Potential benefits of a long term Seroquel  use as well as potential risks and complications (i.e. somnolence, constipation and others) were explained to the patient and were aknowledged.       CAD in native artery   Re-start EC 81 mg ASA if tolerated      Controlled type 2 diabetes mellitus with chronic kidney disease, without long-term current use of insulin  (HCC)   Re-start EC 81 mg ASA if tolerated On Metformin  ER      Relevant Orders   Comprehensive metabolic panel with GFR   TSH   Sore in nose   Sarcoid vs infection Septra  DS x 2 weeks ENT if not better (Dr Tellis Feathers)      Relevant Orders   TSH   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)   On Metformin  ER 500 mg/d  only - does not seem to cause diarrhea      Diarrhea - Primary   Was doing better on Budesonide  po - not covered any longer: change per GI GI appt is later this week  On Metformin  ER 500 mg/d only - does not seem to cause diarrhea       Gait disorder   Using a walker         Meds ordered this encounter  Medications   tobramycin -dexamethasone  (TOBRADEX ) ophthalmic solution    Sig: Place 1 drop into both eyes every 6 (six) hours.    Dispense:  5 mL    Refill:  0    sulfamethoxazole -trimethoprim  (BACTRIM  DS) 800-160 MG tablet    Sig: Take 1 tablet by mouth 2 (two) times daily.    Dispense:  28 tablet    Refill:  1      Follow-up: No follow-ups on file.  Anitra Barn, MD

## 2023-05-05 NOTE — Assessment & Plan Note (Signed)
 Re-start EC 81 mg ASA if tolerated On Metformin  ER

## 2023-05-05 NOTE — Assessment & Plan Note (Signed)
 Re-start EC 81 mg ASA if tolerated

## 2023-05-05 NOTE — Assessment & Plan Note (Signed)
 On Metformin  ER 500 mg/d only - does not seem to cause diarrhea

## 2023-05-05 NOTE — Assessment & Plan Note (Signed)
 On B12

## 2023-05-05 NOTE — Assessment & Plan Note (Addendum)
 Sarcoid vs infection Septra  DS x 2 weeks ENT if not better (Dr Tellis Feathers)

## 2023-05-05 NOTE — Assessment & Plan Note (Signed)
 F/u w/Christopher Jenean Minus, MD - 10/16/2020 1:00 PM EDT Formatting of this note is different from the original. Images from the original note were not included.  Dermatology Note  Assessment and Plan:  Lichen planus - Diagnosis, treatment options, prognosis, risk/ benefit, and side effects of treatment were discussed with the patient. - Start halobetasol 0.05% ointment BID x 2 weeks. Discussed using occlusion with compress or plastic wrap x3-5 days when lesions flare.   Intralesional Kenalog  Procedure Note: After the patient was informed of risks (including atrophy and dyspigmentation), benefits and side effects of intralesional steroid injection, the patient elected to undergo injection and verbal consent was obtained. Skin was cleaned with alcohol  and injected intradermally into the sites (below). The patient tolerated the procedure well without complications and was instructed on post-procedure care. Location(s): bilateral Number of sites treated: 5 Kenalog  (triamcinolone ) Concentration: 10 mg/ml Volume: 0.4 ml total  Sarcoidosis, cutaneous and pulmonary - Dr. Villa Greaser (pulmonary) in the loop, continue adalimumab 40mg  weekly (started eow 06/2016, increased to weekly 09/2016). Next follow-up with pulmonology on 01/25/2021. - Has had ophtho screening in the past, next ophtho appointment scheduled for 10/20/2020. - Quant gold negative on 05/2019; print out of new order provided today for patient to obtain at next internal medicine visit on 11/20/2020   Pruritus  - Continue hydroxyzine  25 mg tablet take 1/4 in the morning and 1/2 in the evening as needed.   - Continue naltrexone 12.5mg  (quartering 50mg  tablets) once daily  The patient was advised to call for an appointment should any new, changing, or symptomatic lesions develop.  RTC: Return in about 3 months (around 01/16/2021). or sooner as needed

## 2023-05-05 NOTE — Assessment & Plan Note (Signed)
 On Vit D

## 2023-05-05 NOTE — Assessment & Plan Note (Signed)
 On Seroquel.   Potential benefits of a long term Seroquel use as well as potential risks and complications (i.e. somnolence, constipation and others) were explained to the patient and were aknowledged.

## 2023-05-05 NOTE — Assessment & Plan Note (Addendum)
 Was doing better on Budesonide  po - not covered any longer: change per GI GI appt is later this week  On Metformin  ER 500 mg/d only - does not seem to cause diarrhea

## 2023-05-05 NOTE — Assessment & Plan Note (Signed)
Using a walker 

## 2023-05-06 ENCOUNTER — Encounter: Payer: Self-pay | Admitting: Internal Medicine

## 2023-05-06 NOTE — Progress Notes (Unsigned)
 Brigitte Canard, PA-C [address] [place], [place]  [address] Phone: [phone]   Primary Care Physician: Genia Kettering, MD  Primary Gastroenterologist:  Brigitte Canard, PA-C / Dr. Laurell Pond   Chief Complaint:  Chronic Diarrhea; history of colon polyps   HPI:   [name] is a 63 y.o. female returns for follow-up of chronic diarrhea and to discuss repeat colonoscopy.  10/2019 Colonoscopy by Dr. Bridgett Camps: (For family history of colon cancer, and evaluate chronic diarrhea): Excellent prep, 4 polyps removed ranging in size from 2 mm to 8 mm.  Pathology showed tubular adenomas x 2, hyperplastic polyp, and benign colon mucosa.  Diverticulosis.  No evidence of IBD.  Biopsies negative for microscopic colitis.  3 year repeat Colonoscopy recommended (overdue).  05/05/23 labs: CBC, CMP, TSH labs normal/stable.  Hemoglobin 13.1. TSH 1.1.  BUN 28, creatinine 0.92, GFR 66.  01/2023 C. difficile toxin A+ B, EIA Negative.  08/2019 Originally seen in our office to evaluate 6 week history of Diarrhea.  Took Lomotil .  GI Pathgen Panal Negative.  Fecal Lactoferrin positive.  Underwent Cholecystectomy January 2020.  PMH signficant for, but not necessarily limited to, COPD on home 02, Sarcoidosis, RA Humira,  DVT on Xarelto ,  diabetes, HTN, fibromyalgia, FMH of colon cancer (Brother age 48).      Current Outpatient Medications  Medication Sig Dispense Refill   acetaminophen  (TYLENOL ) 325 MG tablet Take 1-2 tablets (325-650 mg total) by mouth every 6 (six) hours as needed for mild pain (pain score 1-3 or temp > 100.5). 30 tablet 1   albuterol  (PROVENTIL ) (2.5 MG/3ML) 0.083% nebulizer solution Take 3 mLs (2.5 mg total) by nebulization every 6 (six) hours as needed for wheezing or shortness of breath. 75 mL 5   albuterol  (VENTOLIN  HFA) 108 (90 Base) MCG/ACT inhaler INHALE TWO puffs into THE lungs EVERY SIX HOURS AS NEEDED wheezind AND For SHORTNESS OF BREATH 6.7 g 3   B-D  ULTRA-FINE 33 LANCETS MISC Use as instructed to test sugars once  daily 100 each 12   Blood Glucose Monitoring Suppl DEVI 1 each by Does not apply route in the morning, at noon, and at bedtime. May substitute to any manufacturer covered by patient's insurance. 1 each 0   Budesonide  ER 9 MG TB24 Take 1 tablet (9 mg total) by mouth in the morning. 30 tablet 5   budesonide -formoterol  (SYMBICORT ) 80-4.5 MCG/ACT inhaler Inhale 2 puffs into the lungs 2 (two) times daily. INHALE TWO puffs into THE lungs TWICE DAILY 1 each 12   carvedilol  (COREG ) 12.5 MG tablet TAKE ONE TABLET BY MOUTH TWICE DAILY With meals 180 tablet 3   Cholecalciferol  (VITAMIN D -3) 125 MCG (5000 UT) TABS Take 5,000 Units by mouth daily.     clotrimazole -betamethasone  (LOTRISONE ) cream Apply 1 Application topically 2 (two) times daily. 90 g 1   colesevelam  (WELCHOL ) 625 MG tablet Take 2 tablets (1,250 mg total) by mouth 2 (two) times daily with a meal. 120 tablet 1   Cyanocobalamin  (VITAMIN B-12) 1000 MCG SUBL Place 1 tablet (1,000 mcg total) under the tongue daily. 100 tablet 3   dexlansoprazole  (DEXILANT ) 60 MG capsule TAKE ONE CAPSULE BY MOUTH EVERY DAY 90 capsule 1   diphenoxylate -atropine  (LOMOTIL ) 2.5-0.025 MG tablet Take 1-2 tablets by mouth 4 (four) times daily as needed for diarrhea or loose stools. 60 tablet 1   gabapentin  (NEURONTIN ) 300 MG capsule TAKE 1 CAPSULE BY MOUTH EVERY MORNING. MAY TAKE SECOND CAPSULE DURING  THE DAY AS NEEDED FOR PAIN 180 capsule 1   glucose blood (ACCU-CHEK GUIDE) test strip Use to check blood sugar once a day. 100 each 12   Glucose Blood (BLOOD GLUCOSE TEST STRIPS) STRP Use to check blood sugars every day. May substitute to any manufacturer covered by patient's insurance. 100 each 3   HUMIRA PEN 40 MG/0.4ML PNKT Inject 40 mg as directed every Wednesday.      hydrOXYzine  (ATARAX ) 25 MG tablet TAKE ONE TABLET pop EVERY EIGHT HOURS AS NEEDED 60 tablet 3   Lancet Device MISC 1 each by Does not apply  route in the morning, at noon, and at bedtime. May substitute to any manufacturer covered by patient's insurance. 1 each 0   Lancets Misc. MISC Use to check blood sugars every day. May substitute to any manufacturer covered by patient's insurance. 100 each 3   metFORMIN  (GLUCOPHAGE -XR) 500 MG 24 hr tablet Take 1 tablet (500 mg total) by mouth daily with supper. 90 tablet 3   Multiple Vitamins-Minerals (MULTIVITAMIN WOMEN 50+) TABS Take 1 tablet by mouth daily.     naloxone  (NARCAN ) nasal spray 4 mg/0.1 mL Place 1 spray into the nose once as needed (overdose).     naltrexone (DEPADE) 50 MG tablet Take 50 mg by mouth daily.     OXYGEN  Inhale 2 L into the lungs continuous.     polyvinyl alcohol  (LIQUIFILM TEARS) 1.4 % ophthalmic solution Place 1 drop into both eyes as needed for dry eyes. 15 mL 0   potassium chloride  (KLOR-CON  M) 10 MEQ tablet TAKE ONE TABLET BY MOUTH EVERY DAY 90 tablet 1   QUEtiapine  (SEROQUEL  XR) 200 MG 24 hr tablet Take 1 tablet (200 mg total) by mouth at bedtime. ** STOP 300mg  er tablet ** 30 tablet 5   QUEtiapine  (SEROQUEL ) 50 MG tablet TAKE ONE TABLET BY MOUTH EVERY MORNING AND TAKE TWO TABLETS BY MOUTH AT BEDTIME 90 tablet 5   sulfamethoxazole -trimethoprim  (BACTRIM  DS) 800-160 MG tablet Take 1 tablet by mouth 2 (two) times daily. 28 tablet 1   tobramycin -dexamethasone  (TOBRADEX ) ophthalmic solution Place 1 drop into both eyes every 6 (six) hours. 5 mL 0   topiramate  (TOPAMAX ) 50 MG tablet TAKE ONE TABLET BY MOUTH TWICE DAILY 180 tablet 1   venlafaxine  XR (EFFEXOR -XR) 75 MG 24 hr capsule TAKE ONE CAPSULE BY MOUTH EVERY DAY WITH breakfast 30 capsule 11   XARELTO  20 MG TABS tablet TAKE ONE TABLET BY MOUTH EVERY DAY 90 tablet 3   No current facility-administered medications for this visit.    Allergies as of 05/07/2023 - Review Complete 05/05/2023  Allergen Reaction Noted   Belsomra  [suvorexant ] Other (See Comments) 01/21/2017   Enalapril maleate Cough 02/02/2007   Latex  Hives 05/11/2012   Nickel Other (See Comments) 07/03/2015   Other Other (See Comments) 05/23/2017   Augmentin  [amoxicillin -pot clavulanate]  12/10/2021   Fentanyl   07/10/2022   Morphine  and codeine Itching and Nausea Only 02/11/2018   Doxycycline  Diarrhea and Nausea And Vomiting 05/23/2017   Hydrochlorothiazide Other (See Comments) 02/02/2007   Hydroxychloroquine sulfate Other (See Comments) 07/10/2007   Lyrica [pregabalin] Other (See Comments) 05/23/2017   Tape Other (See Comments) 05/23/2017    Past Medical History:  Diagnosis Date   ALLERGIC RHINITIS 10/26/2009   no current problems   Anxiety    B12 DEFICIENCY 08/25/2007   takes B12 supplement   Complication of anesthesia    pt has had difficulty following anesthesia with her knee in 2016-unable to  care for herself afterward   Depression    takes effexor  xr daily   Diabetes mellitus without complication (HCC)    was on insulin  but has been off since Nov 2015 and now only takes Metformin  daily   DYSPNEA 04/28/2009   uses oxygen  24/7, on 2L via [place]   Esophageal reflux    takes Nexium  daily   Fibromyalgia    Headache    last migraine 2-48yrs ago;takes Topamax  daily   History of shingles    Hypertension    takes Coreg  daily   Insomnia    takes Nortriptyline  nightly    Long-term memory loss    OSA (obstructive sleep apnea)    doesn't use CPAP;sleep study in epic from 2006   Osteoarthritis    Panic attacks    PONV (postoperative nausea and vomiting)    on time in 2016 knee surgery   Rheumatoid arthritis (HCC)    Sarcoidosis    Dr. Zeminski   Stroke [facility])    Vitamin D  deficiency    is supposed to take Vit D but can't afford it    Past Surgical History:  Procedure Laterality Date   APPENDECTOMY     arthroscopic knee surgery Right 11-12-04   AXILLARY ABCESS IRRIGATION AND DEBRIDEMENT  Jul & [id number]   BIOPSY  11/02/2019   Procedure: BIOPSY;  Surgeon: Nannette Babe, MD;  Location: [facility];  Service:  Gastroenterology;;   Ritta Chessman RELEASE Left 05/23/2014   Procedure: CARPAL TUNNEL RELEASE;  Surgeon: Jasmine Mesi, MD;  Location: [facility] OR;  Service: Orthopedics;  Laterality: Left;   CHOLECYSTECTOMY N/A 02/11/2018   Procedure: LAPAROSCOPIC CHOLECYSTECTOMY WITH INTRAOPERATIVE CHOLANGIOGRAM ERAS PATHWAY;  Surgeon: Dareen Ebbing, MD;  Location: [facility] OR;  Service: General;  Laterality: N/A;   COLONOSCOPY WITH PROPOFOL  N/A 11/02/2019   Procedure: COLONOSCOPY WITH PROPOFOL ;  Surgeon: Nannette Babe, MD;  Location: [facility];  Service: Gastroenterology;  Laterality: N/A;   cyst removed from top of buttocks  at age 49   DACRORHINOCYSTOTOMY Bilateral 03/23/2021   Procedure: ENDOSCOPIC DACROCYSTORHINOSTOMY;  Surgeon: Debbra Fairy, MD;  Location: [facility] OR;  Service: Ophthalmology;  Laterality: Bilateral;   ENDOMETRIAL ABLATION     IR CHOLANGIOGRAM EXISTING TUBE  11/21/2017   IR CHOLANGIOGRAM EXISTING TUBE  01/16/2018   IR EXCHANGE BILIARY DRAIN  11/10/2017   IR PATIENT EVAL TECH 0-60 MINS  02/03/2018   IR PERC CHOLECYSTOSTOMY  09/13/2017   IR RADIOLOGIST EVAL & MGMT  10/08/2017   LACRIMAL DUCT EXPLORATION Right 06/26/2017   Procedure: LACRIMAL DUCT EXPLORATION AND ETHMOIDECTOMY;  Surgeon: Karan Osgood, MD;  Location: [facility] OR;  Service: Ophthalmology;  Laterality: Right;   LACRIMAL DUCT EXPLORATION Bilateral 03/23/2021   Procedure: NASAL LACRIMAL DUCT EXPLORATION;  Surgeon: Debbra Fairy, MD;  Location: [facility] OR;  Service: Ophthalmology;  Laterality: Bilateral;   LACRIMAL TUBE INSERTION Bilateral 03/23/2021   Procedure: INSERTION OF CRAWFORD LACRIMAL STENT;  Surgeon: Debbra Fairy, MD;  Location: [facility] OR;  Service: Ophthalmology;  Laterality: Bilateral;   ORIF FEMUR FRACTURE Left 07/31/2021   Procedure: left medial femoral condyle fracture fixation;  Surgeon: Jasmine Mesi, MD;  Location: [facility] OR;  Service: Orthopedics;  Laterality: Left;   POLYPECTOMY  11/02/2019   Procedure: POLYPECTOMY;   Surgeon: Nannette Babe, MD;  Location: [facility];  Service: Gastroenterology;;   TEAR DUCT PROBING Right 06/26/2017   Procedure: TEAR DUCT PROBING WITH STENT;  Surgeon: Karan Osgood, MD;  Location: [facility] OR;  Service: Ophthalmology;  Laterality: Right;   TOTAL KNEE ARTHROPLASTY Right 11/15/2014   Procedure: TOTAL RIGHT KNEE ARTHROPLASTY;  Surgeon: Jasmine Mesi, MD;  Location: [facility] OR;  Service: Orthopedics;  Laterality: Right;   TOTAL KNEE ARTHROPLASTY Left 07/13/2015   Procedure: LEFT TOTAL KNEE ARTHROPLASTY;  Surgeon: Jasmine Mesi, MD;  Location: [facility] OR;  Service: Orthopedics;  Laterality: Left;    Review of Systems:    All systems reviewed and negative except where noted in HPI.    Physical Exam:  LMP 05/19/2003  Patient's last menstrual period was 05/19/2003.  General: Well-nourished, well-developed in no acute distress.  Lungs: Clear to auscultation bilaterally. Non-labored. Heart: Regular rate and rhythm, no murmurs rubs or gallops.  Abdomen: Bowel sounds are normal; Abdomen is Soft; No hepatosplenomegaly, masses or hernias;  No Abdominal Tenderness; No guarding or rebound tenderness. Neuro: Alert and oriented x 3.  Grossly intact.  Psych: Alert and cooperative, normal mood and affect.   Imaging Studies: No results found.  Assessment and Plan:   [name] is a 63 y.o. y/o female returns for follow-up of  1.  Chronic diarrhea  Start cholestyramine  for possible bile salt diarrhea postcholecystectomy.  Hold cholestyramine  and antidiarrhea medication 5 days prior to colonoscopy.  2.  History of colon polyps  Schedule repeat surveillance colonoscopy with Dr. Bridgett Camps in hospital  Request permission to hold Xarelto  prior to procedure.  3.  Family history of brother with colon cancer age 74  4.  Multiple comorbidities: COPD on home 02, Sarcoidosis, RA Humira,  DVT on Xarelto ,  diabetes, HTN, fibromyalgia    Brigitte Canard, PA-C  Follow up ***

## 2023-05-07 ENCOUNTER — Ambulatory Visit (INDEPENDENT_AMBULATORY_CARE_PROVIDER_SITE_OTHER): Admitting: Physician Assistant

## 2023-05-07 ENCOUNTER — Encounter: Payer: Self-pay | Admitting: Physician Assistant

## 2023-05-07 VITALS — BP 130/90 | HR 70 | Ht 67.0 in | Wt 259.0 lb

## 2023-05-07 DIAGNOSIS — K529 Noninfective gastroenteritis and colitis, unspecified: Secondary | ICD-10-CM | POA: Diagnosis not present

## 2023-05-07 DIAGNOSIS — Z800 Family history of malignant neoplasm of digestive organs: Secondary | ICD-10-CM | POA: Diagnosis not present

## 2023-05-07 DIAGNOSIS — Z860100 Personal history of colon polyps, unspecified: Secondary | ICD-10-CM | POA: Diagnosis not present

## 2023-05-07 MED ORDER — NA SULFATE-K SULFATE-MG SULF 17.5-3.13-1.6 GM/177ML PO SOLN: 1.0000 | Freq: Once | ORAL | 0 refills | Status: AC

## 2023-05-07 MED ORDER — CHOLESTYRAMINE 4 G PO PACK: 4.0000 g | PACK | Freq: Every day | ORAL | 5 refills | Status: DC

## 2023-05-07 NOTE — Patient Instructions (Addendum)
 I have sent a prescription for cholestyramine  powder, mix 1 packet in a drink once daily, to help control diarrhea.  You may also continue Lomotil  (diphenoxylate -atropine ) twice daily as needed for diarrhea.  Please stop cholestyramine  and Lomotil  5 days before your colonoscopy to ensure good prep for the procedure.  Your Colonoscopy has been scheduled at [facility] on 07/15/2023 with Dr Bridgett Camps , Separate instructions given  You will be contacted by our office prior to your procedure for directions on holding your Xarelto .  If you do not hear from our office 1 week prior to your scheduled procedure, please call [phone] to discuss.   Due to recent changes in healthcare laws, you may see the results of your imaging and laboratory studies on MyChart before your provider has had a chance to review them.  We understand that in some cases there may be results that are confusing or concerning to you. Not all laboratory results come back in the same time frame and the provider may be waiting for multiple results in order to interpret others.  Please give us  48 hours in order for your provider to thoroughly review all the results before contacting the office for clarification of your results.     Nice to meet you today! Brigitte Canard, PA-C

## 2023-05-08 DIAGNOSIS — J449 Chronic obstructive pulmonary disease, unspecified: Secondary | ICD-10-CM | POA: Diagnosis not present

## 2023-05-13 ENCOUNTER — Other Ambulatory Visit: Payer: Self-pay | Admitting: Internal Medicine

## 2023-05-13 ENCOUNTER — Ambulatory Visit
Admission: RE | Admit: 2023-05-13 | Discharge: 2023-05-13 | Disposition: A | Discharge status: Home / Self Care | Source: Ambulatory Visit | Attending: Internal Medicine | Admitting: Internal Medicine

## 2023-05-13 DIAGNOSIS — Z1231 Encounter for screening mammogram for malignant neoplasm of breast: Secondary | ICD-10-CM | POA: Diagnosis not present

## 2023-05-13 DIAGNOSIS — Z0000 Encounter for general adult medical examination without abnormal findings: Secondary | ICD-10-CM

## 2023-05-16 ENCOUNTER — Other Ambulatory Visit: Payer: Self-pay | Admitting: Internal Medicine

## 2023-05-18 NOTE — Unmapped (Signed)
 Dermatology Note    Assessment and Plan:      Lichen planus, chronic and flaring  - Continue clobetasol 0.05% ointment BID x 2 weeks prn  - We reviewed proper use and side effects of topical steroids including striae and cutaneous atrophy.  - Patient declined ILK     Sarcoidosis, cutaneous and pulmonary, chronic and stable   - Condition has been well-controlled with Humira, which was started in 06/2016 and increased to 40 mg q7d in 09/2016. For more detailed disease course, refer to PMH.   - Diagnosis, treatment options, prognosis, risk/ benefit, and side effects of treatment were discussed with the patient.   - Continue Humira 40 mg weekly.  - REFER to ophthalmology per her request    Concern for impetigo on the ear and around the nose:  - Aerobic swab obtained today, antibiotic pending culture results    Atopic dermatitis:  - Diagnosis, treatment options, prognosis, risk/ benefit, and side effects of treatment were discussed with the patient.   - Start halobetasol 0.05% ointment; Apply twice daily to the flaring areas of the body until skin is clear or for no longer than two weeks  - Start tacrolimus 0.1% ointment; Apply twice daily to affected areas as maintenance. Can use to flaring areas on face/body 2-3x weekly once clear to decrease flares/recurrence.     High risk medication use (Humira)   - Discussed the risks, benefits, alternatives and complications of the biologic medications such as reactivation of tuberculosis or fungal infections, liver damage, lowering of blood counts, injection site reactions, allergic reaction,  development of infections..  The patient expressed understanding and wished to proceed.  - Last negative quant gold on 09/27/2022.    The patient was advised to call for an appointment should any new, changing, or symptomatic lesions develop.     RTC: Return in about 3 months (around 08/19/2023) for follow up of sarcoidosis. or sooner as needed _________________________________________________________________      Chief Complaint     Chief Complaint   Patient presents with    Sarcoidosis     Pt stated really struggling. Meds not helping     HS     Pt stated doing well        HPI     Carolyn Newton is a 63 y.o. female who presents as a returning patient (last seen by Veda Gerald, PA on 09/27/2022) to Dermatology for follow up of sarcoidosis. At last visit, patient was to start clobetasol 0.05% ointment for lichen planus. Patient was additionally to continue humira 40mg  weekly for sarcoidosis.     Today, patient reports a rash on her feet, back, bilateral lower legs and upper extremities. Patient states she is doing humira with no improvement to her skin. Patient states she has not used protopic before.       The patient denies any other new or changing lesions or areas of concern.     Pertinent Past Medical History     Sarcoidosis        Previous disease course:  Long history of pulmonary and cutaneous sarcoidosis since 2003-2004.  Many years on and off of prednisone with temporary improvement, but overall thinks she is worse over time.  Has had a large amount of weight gain in the last year since restarting prednisone in April 2016.   Was on methotrexate given worsening cutaneous involvement of the scalp.  Dose was 10mg  taken 2 doses 12 hours apart in winter 2017-18.  Did that  for 3 months, but stopped because she thought it wasn't helping.  Had been on hydroxychloroquine many years ago, but it was not very helpful.  Has been on doxycycline many times over the years without much improvement.           Past Medical History, Family History, Social History, Medication List, Allergies, and Problem List were reviewed in the rooming section of Epic.     ROS: Other than symptoms mentioned in the HPI, no fevers, chills, or other skin complaints    Physical Examination     Gen: Well-appearing patient, appropriate, interactive, in no acute distress  SKIN: Examination of the ears, neck, chest, upper chest, abdomen, back, upper back, bilateral upper extremities, bilateral lower extremities, genitalia, and groin was performed  - Several circular scaly erythematous plaques on the bilateral upper and lower extremities and trunk, most pronounced on the lower legs   - Yellow crusting and excoriated erythematous plaques of the L ear  - Similar changes around the nose and visible portion of the naris     -sites not commented on demonstrate normal findings.    Scribe's Attestation: Florette Hurry, MD obtained and performed the history, physical exam and medical decision making elements that were entered into the chart.  Signed by Yaakov Heir, Scribe, on May 19, 2023 at 10:12 AM.    ----------------------------------------------------------------------------------------------------------------------  May 19, 2023 1:19 PM. Documentation assistance provided by the Scribe. I was present during the time the encounter was recorded. The information recorded by the Scribe was done at my direction and has been reviewed and validated by me.  ----------------------------------------------------------------------------------------------------------------------       (Approved Template 09/27/2019)

## 2023-05-19 ENCOUNTER — Other Ambulatory Visit: Payer: Self-pay | Admitting: Internal Medicine

## 2023-05-19 ENCOUNTER — Ambulatory Visit: Admit: 2023-05-19 | Discharge: 2023-05-20 | Payer: Medicare (Managed Care)

## 2023-05-19 DIAGNOSIS — L439 Lichen planus, unspecified: Secondary | ICD-10-CM | POA: Diagnosis not present

## 2023-05-19 DIAGNOSIS — L209 Atopic dermatitis, unspecified: Secondary | ICD-10-CM | POA: Diagnosis not present

## 2023-05-19 DIAGNOSIS — D869 Sarcoidosis, unspecified: Secondary | ICD-10-CM | POA: Diagnosis not present

## 2023-05-19 DIAGNOSIS — Z79899 Other long term (current) drug therapy: Secondary | ICD-10-CM | POA: Diagnosis not present

## 2023-05-19 MED ORDER — HALOBETASOL PROPIONATE 0.05 % TOPICAL OINTMENT
TOPICAL | 5 refills | 0.00000 days | Status: CP
Start: 2023-05-19 — End: ?

## 2023-05-19 MED ORDER — TACROLIMUS 0.1 % TOPICAL OINTMENT
TOPICAL | 3 refills | 0.00000 days | Status: CP
Start: 2023-05-19 — End: ?

## 2023-05-19 NOTE — Unmapped (Addendum)
 Meet your team:     Your intake nurse is: Marliss Simple     Please remember to fill out the survey you will receive after your visit. Your comments help us  continue to improve our care.      Thanks in advance!      [facility] D[facility]ology Clinical Staff     For your eczema:  - Apply halobetasol 0.05% ointment twice daily to stubborn, flaring areas on the body until skin is clear or for no longer than two weeks.  - Apply tacrolimus 0.1% ointment twice daily to affected areas on your face as maintenance.

## 2023-05-19 NOTE — Unmapped (Deleted)
 Self-reported severity (0-5): 5  VAS pain today: 5  VAS average pain for the last month: 8  Requiring pain medication? {YES/NO:21013}  If so, what type/frequency? ***  How often in pain?  {hspain:53295}  Level of odor (0-5): 0  Level of itching (0-5): 5  Dressing changes needed for drainage:No drainage/less than once weekly  How much drainage: no drainage  Flare in the last month (Y/N)? Yes.  How long ago was the last flare? {last flare:53297}  Developing new lesions? once a month  Number of inflammatory lesions montly: {number of lesions monthly:53298}  DLQI: 29  Current treatment: ***     How helpful is the current treatment in managing the following aspects of your disease?  Not at all helpful Somewhat helpful Very helpful   Pain  x     Decreasing length of flares x     Decreasing new lesions x     Drainage      Decreasing frequency of flares x     Decreasing severity of flares x     Odor x

## 2023-05-22 DIAGNOSIS — L0100 Impetigo, unspecified: Principal | ICD-10-CM

## 2023-05-22 MED ORDER — CEFADROXIL 500 MG CAPSULE
ORAL_CAPSULE | Freq: Every day | ORAL | 0 refills | 7.00000 days | Status: CP
Start: 2023-05-22 — End: 2023-05-29

## 2023-05-22 NOTE — Unmapped (Signed)
 [facility] Refill Coordination Note    Specialty Medication(s) to be Shipped:   Inflammatory Disorders: Humira    Other medication(s) to be shipped: No additional medications requested for fill at this time     Carolyn Newton, DOB: 04-Jul-1960  Phone: [phone] (home)       All above HIPAA information was verified with patient.     Was a Nurse, learning disability used for this call? No    Completed refill call assessment today to schedule patient's medication shipment from the [facility]  [phone]).  All relevant notes have been reviewed.     Specialty medication(s) and dose(s) confirmed: Regimen is correct and unchanged.   Changes to medications: Carolyn Newton reports no changes at this time.  Changes to insurance: No  New side effects reported not previously addressed with a pharmacist or physician: None reported  Questions for the pharmacist: No    Confirmed patient received a Conservation officer, historic buildings and a Surveyor, mining with first shipment. The patient will receive a drug information handout for each medication shipped and additional FDA Medication Guides as required.       DISEASE/MEDICATION-SPECIFIC INFORMATION        For patients on injectable medications: Patient currently has 1 doses left.  Next injection is scheduled for 5/14.    SPECIALTY MEDICATION ADHERENCE     Medication Adherence    Patient reported X missed doses in the last month: 0  Specialty Medication: HUMIRA PEN CITRATE FREE 40 MG/0.4 ML  Patient is on additional specialty medications: No  Informant: patient              Were doses missed due to medication being on hold? No    HUMIRA PEN CITRATE FREE 40 MG/0.4 ML: 1 doses of medicine on hand       REFERRAL TO PHARMACIST     Referral to the pharmacist: Not needed      [facility]     Shipping address confirmed in Epic.     Cost and Payment: Patient has a $0 copay, payment information is not required.    Delivery Scheduled: Yes, Expected medication delivery date: 5/14.     Medication will be delivered via UPS to the prescription address in Epic WAM.    [name]

## 2023-05-23 ENCOUNTER — Other Ambulatory Visit: Payer: Self-pay | Admitting: Internal Medicine

## 2023-05-27 MED FILL — HUMIRA PEN CITRATE FREE 40 MG/0.4 ML: SUBCUTANEOUS | 28 days supply | Qty: 4 | Fill #8

## 2023-06-04 DIAGNOSIS — J310 Chronic rhinitis: Secondary | ICD-10-CM | POA: Diagnosis not present

## 2023-06-04 DIAGNOSIS — J329 Chronic sinusitis, unspecified: Secondary | ICD-10-CM | POA: Diagnosis not present

## 2023-06-04 DIAGNOSIS — Z9622 Myringotomy tube(s) status: Secondary | ICD-10-CM | POA: Diagnosis not present

## 2023-06-04 DIAGNOSIS — J3489 Other specified disorders of nose and nasal sinuses: Secondary | ICD-10-CM | POA: Diagnosis not present

## 2023-06-04 DIAGNOSIS — H60542 Acute eczematoid otitis externa, left ear: Secondary | ICD-10-CM | POA: Insufficient documentation

## 2023-06-05 NOTE — Progress Notes (Signed)
 Addendum: Reviewed and agree with assessment and management plan. Asha Grumbine, Carie Caddy, MD

## 2023-06-07 DIAGNOSIS — J449 Chronic obstructive pulmonary disease, unspecified: Secondary | ICD-10-CM | POA: Diagnosis not present

## 2023-06-18 ENCOUNTER — Other Ambulatory Visit: Payer: Self-pay | Admitting: Internal Medicine

## 2023-06-18 NOTE — Unmapped (Signed)
 [facility] Refill Coordination Note    Specialty Medication(s) to be Shipped:   Inflammatory Disorders: Humira    Other medication(s) to be shipped: No additional medications requested for fill at this time     Carolyn Newton, DOB: 05/21/1960  Phone: [phone] (home)       All above HIPAA information was verified with patient.     Was a Nurse, learning disability used for this call? No    Completed refill call assessment today to schedule patient's medication shipment from the [facility]  [phone]).  All relevant notes have been reviewed.     Specialty medication(s) and dose(s) confirmed: Regimen is correct and unchanged.   Changes to medications: Carolyn Newton reports no changes at this time.  Changes to insurance: No  New side effects reported not previously addressed with a pharmacist or physician: None reported  Questions for the pharmacist: No    Confirmed patient received a Conservation officer, historic buildings and a Surveyor, mining with first shipment. The patient will receive a drug information handout for each medication shipped and additional FDA Medication Guides as required.       DISEASE/MEDICATION-SPECIFIC INFORMATION        For patients on injectable medications: Patient currently has 1 doses left.  Next injection is scheduled for 6/4.    SPECIALTY MEDICATION ADHERENCE     Medication Adherence    Patient reported X missed doses in the last month: 0  Specialty Medication: HUMIRA PEN CITRATE FREE 40 MG/0.4 ML  Patient is on additional specialty medications: No  Informant: patient              Were doses missed due to medication being on hold? No    HUMIRA PEN CITRATE FREE 40 MG/0.4 ML: 1 doses of medicine on hand       REFERRAL TO PHARMACIST     Referral to the pharmacist: Not needed      [facility]     Shipping address confirmed in Epic.     Cost and Payment: Patient has a $0 copay, payment information is not required.    Delivery Scheduled: Yes, Expected medication delivery date: 6/6.     Medication will be delivered via UPS to the prescription address in Epic WAM.    Carolyn Newton [facility]

## 2023-06-19 MED FILL — HUMIRA PEN CITRATE FREE 40 MG/0.4 ML: SUBCUTANEOUS | 28 days supply | Qty: 4 | Fill #9

## 2023-06-23 ENCOUNTER — Other Ambulatory Visit: Payer: Self-pay | Admitting: Internal Medicine

## 2023-06-24 ENCOUNTER — Ambulatory Visit: Admitting: Podiatry

## 2023-06-28 NOTE — Unmapped (Signed)
 Initialed PA on CMM; KEY: [id number] Pending: Approval/Denial     Will make patient aware of PA status via MyChart message.

## 2023-07-04 ENCOUNTER — Other Ambulatory Visit: Payer: Self-pay | Admitting: Internal Medicine

## 2023-07-08 ENCOUNTER — Encounter: Payer: Self-pay | Admitting: *Deleted

## 2023-07-08 ENCOUNTER — Telehealth: Payer: Self-pay

## 2023-07-08 ENCOUNTER — Telehealth: Payer: Self-pay | Admitting: *Deleted

## 2023-07-08 ENCOUNTER — Encounter (HOSPITAL_COMMUNITY): Payer: Self-pay | Admitting: Internal Medicine

## 2023-07-08 DIAGNOSIS — J449 Chronic obstructive pulmonary disease, unspecified: Secondary | ICD-10-CM | POA: Diagnosis not present

## 2023-07-08 NOTE — Telephone Encounter (Signed)
  [name] [facility] [birth year] [id number]     Dear Dr Garald:  We have scheduled the above named patient for a(n) Colonoscopy procedure. Our records show that (s)he is on anticoagulation therapy.  Please advise as to whether the patient may come off their therapy of Xarelto  2 days prior to their procedure which is scheduled for 07/15/2023 at [facility].  Please route your response to Grayce Loge or fax response to [phone].  Sincerely,    [facility]

## 2023-07-08 NOTE — Telephone Encounter (Signed)
 Procedure:colon Procedure date: 07/15/23 Procedure location: [facility] Arrival Time: 8:15 Spoke with the patient Y/N: Y Any prep concerns? N  Has the patient obtained the prep from the pharmacy ? Y Do you have a care partner and transportation: Y Any additional concerns? N

## 2023-07-08 NOTE — Telephone Encounter (Signed)
  [name] [facility] 12/28/1960 [id number]     Dear Dr Jude:  We have scheduled the above named patient for a(n) Colonoscopy procedure at [facility]  Anesthesia is requiring a pulmonary clearance before her procedure on 07/15/2023.  If you could, Please advise and route back to Grayce Loge, [facility]   [facility]

## 2023-07-08 NOTE — Progress Notes (Incomplete)
 [name]   PCP-Plotnikov MD Cardiologist-Nishan MD PulmonologistGLENWOOD Staff MD  EKG-06/07/22 Echo-11/21/16 Cath-n/a Stress-02/18/13 ICD/PM-PM GLP1-n/a Blood Thinner- Xarelto  (unsure when to hold, will contact office)  HX:HTN,Stroke, DVT/PE, Sarcoidosis, DM,OSA. Last saw her 06/04/22 and recommended f/u 1 year. She follows with pulm and last saw them 03/10/23, is due to see them again 07/25/23, she is still on 2-3L02, they manage her blood thinner. She reports no new cardiac or breathing issues, will bring portable tank with her. She  did report last colonoscopy in 2021 she was admitted overnight d/t her health issues and was dizzy. Looking at records looks like the colonoscopy was ok but in 03/2021 was admitted after elective dacryocystorhinostomy.  Anesthesia Review- Yes- needs pulm clearance.

## 2023-07-09 NOTE — Telephone Encounter (Signed)
 Spoke with patient and explained to her to hold her Xarelto  2 days this was cleared by Pulmonary and also cleared from a pulmonary standpoint which anesthesia at [facility] wanted.   Patient understood instructions and had no further questions   FYI  Dr Albertus

## 2023-07-09 NOTE — Telephone Encounter (Signed)
 LOV 02/2023  -patient in stable condition with Chronic respiratory failure /COPD/chronic bronchitis /Sarcoid.   Plans for colonoscopy at [facility] endo 07/15/23  for colonoscopy with conscious sedation.   She will be a moderate to high risk for surgery/procedure  from pulmonary standpoint. Would recommend procedure done in hospital setting (not OP suite)   Major Pulmonary risks identified in the multifactorial risk analysis are but not limited to a) pneumonia; b) recurrent intubation risk; c) prolonged or recurrent acute respiratory failure needing mechanical ventilation; d) prolonged hospitalization; e) DVT/Pulmonary embolism; f) Acute Pulmonary edema  Recommend Aggressive pulmonary toilet with o2, bronchodilatation, and incentive spirometry and early ambulation to decrease potential complications   She is on Xarelto  - please hold /resume per protocol.

## 2023-07-09 NOTE — Telephone Encounter (Signed)
 Dr Avram, You were Doc of the Day this morning   This patient has a colonoscopy scheduled on 7/1 at [facility] with Dr Albertus   The anesthesia  team wanted  a clearance from Pulmonary, Please read their response.  Medium to high risk for procedure and needs conscious sedation... Is patient in your opinion ok to proceed with her procedure?   Thanks

## 2023-07-09 NOTE — Telephone Encounter (Signed)
 Patient ok by Pulmonary to hold Xarelto  2 days

## 2023-07-09 NOTE — Telephone Encounter (Signed)
 It looks ok to proceed as planned Would share this information with anesthesia also

## 2023-07-09 NOTE — Telephone Encounter (Signed)
 Shared information with Elenor Lennie PEAK from Endo at [facility] she is going to inform Anesthesia.    Will you let anesthesia know   11:10 CB Elenor LILLETTE Lennie, RN Yes will do

## 2023-07-09 NOTE — Telephone Encounter (Signed)
 See note Patient needs conscious sedation also moderate to high risk for procedure

## 2023-07-10 ENCOUNTER — Telehealth: Payer: Self-pay

## 2023-07-10 NOTE — Telephone Encounter (Signed)
 Noted, patient aware Pulmonary has approved 2 days already. Thank You !

## 2023-07-10 NOTE — Telephone Encounter (Signed)
 This would be okay.  Thank you

## 2023-07-10 NOTE — Telephone Encounter (Signed)
 Copied from CRM [id number]. Topic: Clinical - Medication Question >> Jul 10, 2023  2:13 PM Robinson H wrote: Reason for CRM: [place] with [facility] is calling to see if a medication can be added to patients profile, states Crestor or any of the statons, states it minimizes strokes.  [facility] [phone]

## 2023-07-11 NOTE — Telephone Encounter (Signed)
 Returned patients call and she was asking again about her Xarelto   I went over instructions with her again about 2 day hold on Xarelto  and instructed her on her Metformin  as well

## 2023-07-11 NOTE — Telephone Encounter (Signed)
 Patient requesting f/u call in regards to previous note, please advise.   Thank you

## 2023-07-14 ENCOUNTER — Ambulatory Visit: Payer: 59 | Admitting: Adult Health

## 2023-07-15 ENCOUNTER — Ambulatory Visit (HOSPITAL_COMMUNITY): Admitting: Certified Registered"

## 2023-07-15 ENCOUNTER — Other Ambulatory Visit: Payer: Self-pay

## 2023-07-15 ENCOUNTER — Ambulatory Visit (HOSPITAL_COMMUNITY)
Admission: RE | Admit: 2023-07-15 | Discharge: 2023-07-15 | Disposition: A | Discharge status: Home / Self Care | Attending: Internal Medicine | Admitting: Internal Medicine

## 2023-07-15 ENCOUNTER — Encounter (HOSPITAL_COMMUNITY)
Admission: RE | Disposition: A | Discharge status: Home / Self Care | Payer: Self-pay | Source: Home / Self Care | Attending: Internal Medicine

## 2023-07-15 ENCOUNTER — Encounter (HOSPITAL_COMMUNITY): Payer: Self-pay | Admitting: Internal Medicine

## 2023-07-15 DIAGNOSIS — Z7901 Long term (current) use of anticoagulants: Secondary | ICD-10-CM | POA: Insufficient documentation

## 2023-07-15 DIAGNOSIS — D869 Sarcoidosis, unspecified: Secondary | ICD-10-CM | POA: Insufficient documentation

## 2023-07-15 DIAGNOSIS — E119 Type 2 diabetes mellitus without complications: Secondary | ICD-10-CM | POA: Insufficient documentation

## 2023-07-15 DIAGNOSIS — I2510 Atherosclerotic heart disease of native coronary artery without angina pectoris: Secondary | ICD-10-CM | POA: Insufficient documentation

## 2023-07-15 DIAGNOSIS — R197 Diarrhea, unspecified: Secondary | ICD-10-CM | POA: Insufficient documentation

## 2023-07-15 DIAGNOSIS — F32A Depression, unspecified: Secondary | ICD-10-CM | POA: Insufficient documentation

## 2023-07-15 DIAGNOSIS — Z87891 Personal history of nicotine dependence: Secondary | ICD-10-CM | POA: Insufficient documentation

## 2023-07-15 DIAGNOSIS — Z7902 Long term (current) use of antithrombotics/antiplatelets: Secondary | ICD-10-CM | POA: Insufficient documentation

## 2023-07-15 DIAGNOSIS — Z9981 Dependence on supplemental oxygen: Secondary | ICD-10-CM | POA: Insufficient documentation

## 2023-07-15 DIAGNOSIS — K219 Gastro-esophageal reflux disease without esophagitis: Secondary | ICD-10-CM | POA: Diagnosis not present

## 2023-07-15 DIAGNOSIS — J449 Chronic obstructive pulmonary disease, unspecified: Secondary | ICD-10-CM | POA: Diagnosis not present

## 2023-07-15 DIAGNOSIS — Z1211 Encounter for screening for malignant neoplasm of colon: Secondary | ICD-10-CM | POA: Insufficient documentation

## 2023-07-15 DIAGNOSIS — G4730 Sleep apnea, unspecified: Secondary | ICD-10-CM | POA: Insufficient documentation

## 2023-07-15 DIAGNOSIS — F419 Anxiety disorder, unspecified: Secondary | ICD-10-CM | POA: Diagnosis not present

## 2023-07-15 DIAGNOSIS — M069 Rheumatoid arthritis, unspecified: Secondary | ICD-10-CM | POA: Insufficient documentation

## 2023-07-15 DIAGNOSIS — K5730 Diverticulosis of large intestine without perforation or abscess without bleeding: Secondary | ICD-10-CM | POA: Diagnosis not present

## 2023-07-15 DIAGNOSIS — I10 Essential (primary) hypertension: Secondary | ICD-10-CM | POA: Diagnosis not present

## 2023-07-15 DIAGNOSIS — Z79899 Other long term (current) drug therapy: Secondary | ICD-10-CM | POA: Diagnosis not present

## 2023-07-15 DIAGNOSIS — Z860100 Personal history of colon polyps, unspecified: Secondary | ICD-10-CM | POA: Diagnosis not present

## 2023-07-15 DIAGNOSIS — M797 Fibromyalgia: Secondary | ICD-10-CM | POA: Diagnosis not present

## 2023-07-15 DIAGNOSIS — R519 Headache, unspecified: Secondary | ICD-10-CM | POA: Insufficient documentation

## 2023-07-15 DIAGNOSIS — Z860101 Personal history of adenomatous and serrated colon polyps: Secondary | ICD-10-CM | POA: Insufficient documentation

## 2023-07-15 DIAGNOSIS — Z800 Family history of malignant neoplasm of digestive organs: Secondary | ICD-10-CM | POA: Diagnosis not present

## 2023-07-15 DIAGNOSIS — G4733 Obstructive sleep apnea (adult) (pediatric): Secondary | ICD-10-CM | POA: Diagnosis not present

## 2023-07-15 DIAGNOSIS — Z7951 Long term (current) use of inhaled steroids: Secondary | ICD-10-CM | POA: Diagnosis not present

## 2023-07-15 DIAGNOSIS — Z7984 Long term (current) use of oral hypoglycemic drugs: Secondary | ICD-10-CM | POA: Diagnosis not present

## 2023-07-15 DIAGNOSIS — K529 Noninfective gastroenteritis and colitis, unspecified: Secondary | ICD-10-CM

## 2023-07-15 HISTORY — PX: COLONOSCOPY: SHX5424

## 2023-07-15 LAB — GLUCOSE, CAPILLARY: Glucose-Capillary: 105 mg/dL — ABNORMAL HIGH (ref 70–99)

## 2023-07-15 SURGERY — COLONOSCOPY
Anesthesia: Monitor Anesthesia Care

## 2023-07-15 MED ORDER — SODIUM CHLORIDE 0.9 % IV SOLN
INTRAVENOUS | Status: DC
Start: 2023-07-15 — End: 2023-07-15

## 2023-07-15 MED ORDER — PROPOFOL 10 MG/ML IV BOLUS
INTRAVENOUS | Status: DC | PRN
  Administered 2023-07-15 (×2): 20 mg via INTRAVENOUS

## 2023-07-15 MED ORDER — SODIUM CHLORIDE 0.9 % IV SOLN
INTRAVENOUS | Status: AC | PRN
  Administered 2023-07-15: 500 mL via INTRAMUSCULAR

## 2023-07-15 MED ORDER — SODIUM CHLORIDE 0.9 % IV SOLN: INTRAVENOUS | Status: DC

## 2023-07-15 MED ORDER — PROPOFOL 1000 MG/100ML IV EMUL
INTRAVENOUS | Status: AC
  Filled 2023-07-15: qty 100

## 2023-07-15 MED ORDER — PROPOFOL 500 MG/50ML IV EMUL
INTRAVENOUS | Status: DC | PRN
  Administered 2023-07-15: 135 ug/kg/min via INTRAVENOUS

## 2023-07-15 MED ORDER — PROPOFOL 500 MG/50ML IV EMUL
INTRAVENOUS | Status: AC
Start: 2023-07-15 — End: 2023-07-15
  Filled 2023-07-15: qty 50

## 2023-07-15 NOTE — Anesthesia Postprocedure Evaluation (Signed)
 Anesthesia Post Note  Patient: [name] [facility]  Procedure(s) Performed: COLONOSCOPY     Patient location during evaluation: Endoscopy Anesthesia Type: MAC Level of consciousness: awake and alert Pain management: pain level controlled Vital Signs Assessment: post-procedure vital signs reviewed and stable Respiratory status: spontaneous breathing, nonlabored ventilation, respiratory function stable and patient connected to nasal cannula oxygen  Cardiovascular status: blood pressure returned to baseline and stable Postop Assessment: no apparent nausea or vomiting Anesthetic complications: no  No notable events documented.  Last Vitals:  Vitals:   07/15/23 1020 07/15/23 1030  BP: (!) 114/52 (!) 154/74  Pulse: 75 76  Resp: 18 16  Temp:    SpO2: 100% 98%    Last Pain:  Vitals:   07/15/23 1030  TempSrc:   PainSc: 0-No pain                 Ralph Brouwer L Eron Goble

## 2023-07-15 NOTE — Anesthesia Procedure Notes (Signed)
 Procedure Name: MAC Date/Time: 07/15/2023 9:49 AM  Performed by: Brandy Almarie BROCKS, CRNAPre-anesthesia Checklist: Patient identified, Emergency Drugs available, Patient being monitored and Suction available Oxygen  Delivery Method: Simple face mask

## 2023-07-15 NOTE — Discharge Instructions (Signed)
 YOU HAD AN ENDOSCOPIC PROCEDURE TODAY: Refer to the procedure report and other information in the discharge instructions given to you for any specific questions about what was found during the examination. If this information does not answer your questions, please call [place] office at [phone] to clarify.   YOU SHOULD EXPECT: Some feelings of bloating in the abdomen. Passage of more gas than usual. Walking can help get rid of the air that was put into your GI tract during the procedure and reduce the bloating. If you had a lower endoscopy (such as a colonoscopy or flexible sigmoidoscopy) you may notice spotting of blood in your stool or on the toilet paper. Some abdominal soreness may be present for a day or two, also.  DIET: Your first meal following the procedure should be a light meal and then it is ok to progress to your normal diet. A half-sandwich or bowl of soup is an example of a good first meal. Heavy or fried foods are harder to digest and may make you feel nauseous or bloated. Drink plenty of fluids but you should avoid alcoholic beverages for 24 hours.   ACTIVITY: Your care partner should take you home directly after the procedure. You should plan to take it easy, moving slowly for the rest of the day. You can resume normal activity the day after the procedure however YOU SHOULD NOT DRIVE, use power tools, machinery or perform tasks that involve climbing or major physical exertion for 24 hours (because of the sedation medicines used during the test).   SYMPTOMS TO REPORT IMMEDIATELY: A gastroenterologist can be reached at any hour. Please call [phone]  for any of the following symptoms:  Following lower endoscopy (colonoscopy, flexible sigmoidoscopy) Excessive amounts of blood in the stool  Significant tenderness, worsening of abdominal pains  Swelling of the abdomen that is new, acute  Fever of 100 or higher    FOLLOW UP:  If any biopsies were taken you will be contacted by  phone or by letter within the next 1-3 weeks. Call [phone]  if you have not heard about the biopsies in 3 weeks.  Please also call with any specific questions about appointments or follow up tests.

## 2023-07-15 NOTE — Anesthesia Preprocedure Evaluation (Addendum)
 Anesthesia Evaluation  Patient identified by MRN, date of birth, ID band Patient awake    Reviewed: Allergy  & Precautions, NPO status , Patient's Chart, lab work & pertinent test results  History of Anesthesia Complications (+) PONV and history of anesthetic complications  Airway Mallampati: III  TM Distance: >3 FB Neck ROM: Full    Dental  (+) Edentulous Upper, Dental Advisory Given, Chipped   Pulmonary sleep apnea and Oxygen  sleep apnea , COPD (2L O2),  COPD inhaler and oxygen  dependent, former smoker Sarcoidosis    Pulmonary exam normal breath sounds clear to auscultation       Cardiovascular hypertension, Pt. on home beta blockers and Pt. on medications + CAD  Normal cardiovascular exam Rhythm:Regular Rate:Normal  TTE 2018 - Left ventricle: The cavity size was normal. Wall thickness was    increased in a pattern of mild LVH. Systolic function was normal.    The estimated ejection fraction was in the range of 60% to 65%.    Although no diagnostic regional wall motion abnormality was    identified, this possibility cannot be completely excluded on the    basis of this study. Doppler parameters are consistent with    abnormal left ventricular relaxation (grade 1 diastolic    dysfunction).  - Aortic valve: There was no stenosis.  - Mitral valve: There was no significant regurgitation.  - Right ventricle: The cavity size was normal. Systolic function    was normal.  - Pulmonary arteries: No complete TR doppler jet so unable to    estimate PA systolic pressure.  - Systemic veins: IVC not visualized.     Neuro/Psych  Headaches PSYCHIATRIC DISORDERS Anxiety Depression    CVA    GI/Hepatic Neg liver ROS,GERD  ,,  Endo/Other  diabetes, Type 2, Oral Hypoglycemic Agents    Renal/GU negative Renal ROS  negative genitourinary   Musculoskeletal  (+) Arthritis , Rheumatoid disorders,  Fibromyalgia -  Abdominal   Peds   Hematology  (+) Blood dyscrasia (xarelto )   Anesthesia Other Findings   Reproductive/Obstetrics                             Anesthesia Physical Anesthesia Plan  ASA: 3  Anesthesia Plan: MAC   Post-op Pain Management:    Induction: Intravenous  PONV Risk Score and Plan: Propofol  infusion and Treatment may vary due to age or medical condition  Airway Management Planned: Natural Airway  Additional Equipment:   Intra-op Plan:   Post-operative Plan:   Informed Consent: I have reviewed the patients History and Physical, chart, labs and discussed the procedure including the risks, benefits and alternatives for the proposed anesthesia with the patient or authorized representative who has indicated his/her understanding and acceptance.     Dental advisory given  Plan Discussed with: CRNA  Anesthesia Plan Comments:        Anesthesia Quick Evaluation

## 2023-07-15 NOTE — Transfer of Care (Signed)
 Immediate Anesthesia Transfer of Care Note  Patient: [name] [facility]  Procedure(s) Performed: COLONOSCOPY  Patient Location: PACU  Anesthesia Type:MAC  Level of Consciousness: drowsy  Airway & Oxygen  Therapy: Patient Spontanous Breathing and Patient connected to face mask oxygen   Post-op Assessment: Report given to RN, Post -op Vital signs reviewed and stable, and Patient moving all extremities X 4  Post vital signs: Reviewed and stable  Last Vitals:  Vitals Value Taken Time  BP 107/53   Temp    Pulse 77 07/15/23 10:18  Resp 18 07/15/23 10:18  SpO2 98 % 07/15/23 10:18  Vitals shown include unfiled device data.  Last Pain:  Vitals:   07/15/23 0847  TempSrc: Temporal  PainSc: 5          Complications: No notable events documented.

## 2023-07-15 NOTE — H&P (Signed)
 GASTROENTEROLOGY PROCEDURE H&P NOTE   Primary Care Physician: Garald Karlynn GAILS, MD    Reason for Procedure:  History of adenomatous colon polyps and family history of colon cancer  Plan:    Colonoscopy  The nature of the procedure, as well as the risks, benefits, and alternatives were carefully and thoroughly reviewed with the patient. Ample time for discussion and questions allowed. The patient understood, was satisfied, and agreed to proceed.     HPI: [name] is a 63 y.o. female who presents for colonoscopy.  Medical history as below.  Tolerated the prep.  No recent chest pain or shortness of breath.  No abdominal pain today.  Xarelto  on hold x 2 days  Past Medical History:  Diagnosis Date   ALLERGIC RHINITIS 10/26/2009   no current problems   Anxiety    B12 DEFICIENCY 08/25/2007   takes B12 supplement   Complication of anesthesia    pt has had difficulty following anesthesia with her knee in 2016-unable to care for herself afterward   Depression    takes effexor  xr daily   Diabetes mellitus without complication (HCC)    was on insulin  but has been off since Nov 2015 and now only takes Metformin  daily   DYSPNEA 04/28/2009   uses oxygen  24/7, on 2L via [place]   Esophageal reflux    takes Nexium  daily   Fibromyalgia    Headache    last migraine 2-67yrs ago;takes Topamax  daily   History of shingles    Hypertension    takes Coreg  daily   Insomnia    takes Nortriptyline  nightly    Long-term memory loss    OSA (obstructive sleep apnea)    doesn't use CPAP;sleep study in epic from 2006   Osteoarthritis    Panic attacks    PONV (postoperative nausea and vomiting)    on time in 2016 knee surgery   Rheumatoid arthritis (HCC)    Sarcoidosis    Dr. Zeminski   Stroke [facility])    Vitamin D  deficiency    is supposed to take Vit D but can't afford it    Past Surgical History:  Procedure Laterality Date   APPENDECTOMY     arthroscopic knee surgery  Right 11-12-04   AXILLARY ABCESS IRRIGATION AND DEBRIDEMENT  Jul & [id number]   BIOPSY  11/02/2019   Procedure: BIOPSY;  Surgeon: Albertus Gordy HERO, MD;  Location: [facility];  Service: Gastroenterology;;   ORIN MEDIATE RELEASE Left 05/23/2014   Procedure: CARPAL TUNNEL RELEASE;  Surgeon: Glendia Cordella Hutchinson, MD;  Location: [facility] OR;  Service: Orthopedics;  Laterality: Left;   CHOLECYSTECTOMY N/A 02/11/2018   Procedure: LAPAROSCOPIC CHOLECYSTECTOMY WITH INTRAOPERATIVE CHOLANGIOGRAM ERAS PATHWAY;  Surgeon: Belinda Cough, MD;  Location: [facility] OR;  Service: General;  Laterality: N/A;   COLONOSCOPY WITH PROPOFOL  N/A 11/02/2019   Procedure: COLONOSCOPY WITH PROPOFOL ;  Surgeon: Albertus Gordy HERO, MD;  Location: [facility];  Service: Gastroenterology;  Laterality: N/A;   cyst removed from top of buttocks  at age 70   DACRORHINOCYSTOTOMY Bilateral 03/23/2021   Procedure: ENDOSCOPIC DACROCYSTORHINOSTOMY;  Surgeon: Rodgers Faden, MD;  Location: [facility] OR;  Service: Ophthalmology;  Laterality: Bilateral;   ENDOMETRIAL ABLATION     IR CHOLANGIOGRAM EXISTING TUBE  11/21/2017   IR CHOLANGIOGRAM EXISTING TUBE  01/16/2018   IR EXCHANGE BILIARY DRAIN  11/10/2017   IR PATIENT EVAL TECH 0-60 MINS  02/03/2018   IR PERC CHOLECYSTOSTOMY  09/13/2017   IR RADIOLOGIST EVAL & MGMT  10/08/2017  LACRIMAL DUCT EXPLORATION Right 06/26/2017   Procedure: LACRIMAL DUCT EXPLORATION AND ETHMOIDECTOMY;  Surgeon: Laurie Loyd Redhead, MD;  Location: [facility] OR;  Service: Ophthalmology;  Laterality: Right;   LACRIMAL DUCT EXPLORATION Bilateral 03/23/2021   Procedure: NASAL LACRIMAL DUCT EXPLORATION;  Surgeon: Rodgers Faden, MD;  Location: [facility] OR;  Service: Ophthalmology;  Laterality: Bilateral;   LACRIMAL TUBE INSERTION Bilateral 03/23/2021   Procedure: INSERTION OF CRAWFORD LACRIMAL STENT;  Surgeon: Rodgers Faden, MD;  Location: [facility] OR;  Service: Ophthalmology;  Laterality: Bilateral;   ORIF FEMUR FRACTURE Left 07/31/2021   Procedure: left medial femoral  condyle fracture fixation;  Surgeon: [name], MD;  Location: [facility] OR;  Service: Orthopedics;  Laterality: Left;   POLYPECTOMY  11/02/2019   Procedure: POLYPECTOMY;  Surgeon: Albertus Gordy HERO, MD;  Location: [facility];  Service: Gastroenterology;;   TEAR DUCT PROBING Right 06/26/2017   Procedure: TEAR DUCT PROBING WITH STENT;  Surgeon: Laurie Loyd Redhead, MD;  Location: [facility] OR;  Service: Ophthalmology;  Laterality: Right;   TOTAL KNEE ARTHROPLASTY Right 11/15/2014   Procedure: TOTAL RIGHT KNEE ARTHROPLASTY;  Surgeon: Hamilton Cordella Addie, MD;  Location: [facility] OR;  Service: Orthopedics;  Laterality: Right;   TOTAL KNEE ARTHROPLASTY Left 07/13/2015   Procedure: LEFT TOTAL KNEE ARTHROPLASTY;  Surgeon: Hamilton Cordella Addie, MD;  Location: [facility] OR;  Service: Orthopedics;  Laterality: Left;    Prior to Admission medications   Medication Sig Start Date End Date Taking? Authorizing Provider  albuterol  (VENTOLIN  HFA) 108 (90 Base) MCG/ACT inhaler INHALE TWO puffs into THE lungs EVERY SIX HOURS AS NEEDED wheezind AND For SHORTNESS OF BREATH 04/15/22  Yes Parrett, Tammy S, NP  budesonide -formoterol  (SYMBICORT ) 80-4.5 MCG/ACT inhaler Inhale 2 puffs into the lungs 2 (two) times daily. INHALE TWO puffs into THE lungs TWICE DAILY 10/10/22  Yes Parrett, Tammy S, NP  carvedilol  (COREG ) 12.5 MG tablet TAKE ONE TABLET BY MOUTH TWICE DAILY With meals 10/16/22  Yes Plotnikov, Karlynn GAILS, MD  Cholecalciferol  (VITAMIN D -3) 125 MCG (5000 UT) TABS Take 5,000 Units by mouth daily.   Yes [provider]  cholestyramine  (QUESTRAN ) 4 g packet Take 1 packet (4 g total) by mouth daily. 05/07/23 11/03/23 Yes [place], PA-C  clotrimazole -betamethasone  (LOTRISONE ) cream APPLY TO THE AFFECTED AREA(S) TWICE DAILY 05/19/23  Yes Plotnikov, Aleksei V, MD  Cyanocobalamin  (VITAMIN B-12) 1000 MCG SUBL Place 1 tablet (1,000 mcg total) under the tongue daily. 11/15/19  Yes Plotnikov, Karlynn GAILS, MD  dexlansoprazole  (DEXILANT ) 60 MG capsule  TAKE ONE CAPSULE BY MOUTH EVERY DAY 12/31/22  Yes Plotnikov, Aleksei V, MD  diphenoxylate -atropine  (LOMOTIL ) 2.5-0.025 MG tablet Take 1-2 tablets by mouth 4 (four) times daily as needed for diarrhea or loose stools. 12/11/22  Yes Plotnikov, Karlynn GAILS, MD  gabapentin  (NEURONTIN ) 300 MG capsule TAKE 1 CAPSULE BY MOUTH EVERY MORNING. MAY TAKE SECOND CAPSULE DURING THE DAY AS NEEDED FOR PAIN 04/23/23  Yes Plotnikov, Karlynn GAILS, MD  HUMIRA PEN 40 MG/0.4ML PNKT Inject 40 mg as directed every Wednesday.  04/13/19  Yes [provider]  hydrOXYzine  (ATARAX ) 25 MG tablet TAKE ONE TABLET pop EVERY EIGHT HOURS AS NEEDED 06/26/23  Yes Plotnikov, Aleksei V, MD  metFORMIN  (GLUCOPHAGE -XR) 500 MG 24 hr tablet Take 1 tablet (500 mg total) by mouth daily with supper. 04/21/23  Yes Thapa, [place], MD  Multiple Vitamins-Minerals (MULTIVITAMIN WOMEN 50+) TABS Take 1 tablet by mouth daily.   Yes [provider]  naltrexone (DEPADE) 50 MG tablet Take 50 mg by mouth  daily. 11/29/21  Yes [provider]  OXYGEN  Inhale 2 L into the lungs continuous.   Yes [provider]  potassium chloride  (KLOR-CON  M) 10 MEQ tablet TAKE ONE TABLET BY MOUTH EVERY DAY 02/18/23  Yes Plotnikov, Aleksei V, MD  QUEtiapine  (SEROQUEL  XR) 200 MG 24 hr tablet Take 1 tablet (200 mg total) by mouth at bedtime. ** STOP 300mg  er tablet ** 03/21/23  Yes Plotnikov, Karlynn GAILS, MD  QUEtiapine  (SEROQUEL ) 50 MG tablet TAKE ONE TABLET BY MOUTH EVERY MORNING AND TAKE TWO TABLETS BY MOUTH AT BEDTIME 04/09/23  Yes Plotnikov, Aleksei V, MD  tobramycin -dexamethasone  (TOBRADEX ) ophthalmic solution Place 1 drop into both eyes every 6 (six) hours. 05/05/23  Yes Plotnikov, Karlynn GAILS, MD  topiramate  (TOPAMAX ) 50 MG tablet TAKE ONE TABLET BY MOUTH TWICE DAILY 07/04/23  Yes Plotnikov, Aleksei V, MD  venlafaxine  XR (EFFEXOR -XR) 75 MG 24 hr capsule TAKE ONE CAPSULE BY MOUTH EVERY DAY WITH breakfast 07/23/22  Yes Plotnikov, Aleksei V, MD  acetaminophen   (TYLENOL ) 325 MG tablet Take 1-2 tablets (325-650 mg total) by mouth every 6 (six) hours as needed for mild pain (pain score 1-3 or temp > 100.5). 08/06/21   [name], MD  albuterol  (PROVENTIL ) (2.5 MG/3ML) 0.083% nebulizer solution Take 3 mLs (2.5 mg total) by nebulization every 6 (six) hours as needed for wheezing or shortness of breath. 04/18/22 04/18/23  Parrett, Madelin RAMAN, NP  B-D ULTRA-FINE 33 LANCETS MISC Use as instructed to test sugars once  daily 05/24/19   Trixie File, MD  Blood Glucose Monitoring Suppl DEVI 1 each by Does not apply route in the morning, at noon, and at bedtime. May substitute to any manufacturer covered by patient's insurance. 04/21/23   Thapa, [place], MD  colesevelam  (WELCHOL ) 625 MG tablet TAKE TWO TABLETS (1250mg  total) BY MOUTH TWICE DAILY With meals 05/13/23   Plotnikov, Aleksei V, MD  glucose blood (ACCU-CHEK GUIDE) test strip Use to check blood sugar once a day. 05/24/19   Trixie File, MD  Glucose Blood (BLOOD GLUCOSE TEST STRIPS) STRP Use to check blood sugars every day. May substitute to any manufacturer covered by patient's insurance. 04/23/23   Thapa, [place], MD  Lancets Misc. MISC Use to check blood sugars every day. May substitute to any manufacturer covered by patient's insurance. 04/23/23   Thapa, [place], MD  naloxone  (NARCAN ) nasal spray 4 mg/0.1 mL Place 1 spray into the nose once as needed (overdose).    [provider]  polyvinyl alcohol  (LIQUIFILM TEARS) 1.4 % ophthalmic solution Place 1 drop into both eyes as needed for dry eyes. 03/24/21   Arlice Reichert, MD  sulfamethoxazole -trimethoprim  (BACTRIM  DS) 800-160 MG tablet TAKE ONE TABLET BY MOUTH TWICE DAILY 06/18/23   Plotnikov, Aleksei V, MD  XARELTO  20 MG TABS tablet TAKE ONE TABLET BY MOUTH EVERY DAY 02/21/23   Plotnikov, Aleksei V, MD    Current Facility-Administered Medications  Medication Dose Route Frequency Provider Last Rate Last Admin   0.9 %  sodium chloride  infusion   Intravenous  Continuous [place], PA-C       0.9 %  sodium chloride  infusion    Continuous PRN Albertus Gordy HERO, MD 10 mL/hr at 07/15/23 0916 500 mL at 07/15/23 0916   0.9 %  sodium chloride  infusion   Intravenous Continuous Aphrodite Harpenau, Gordy HERO, MD        Allergies as of 05/07/2023 - Review Complete 05/07/2023  Allergen Reaction Noted   Belsomra  [suvorexant ] Other (See Comments) 01/21/2017  Enalapril maleate Cough 02/02/2007   Latex Hives 05/11/2012   Nickel Other (See Comments) 07/03/2015   Other Other (See Comments) 05/23/2017   Augmentin  [amoxicillin -pot clavulanate]  12/10/2021   Fentanyl   07/10/2022   Morphine  and codeine Itching and Nausea Only 02/11/2018   Doxycycline  Diarrhea and Nausea And Vomiting 05/23/2017   Hydrochlorothiazide Other (See Comments) 02/02/2007   Hydroxychloroquine sulfate Other (See Comments) 07/10/2007   Lyrica [pregabalin] Other (See Comments) 05/23/2017   Tape Other (See Comments) 05/23/2017    Family History  Problem Relation Age of Onset   Heart disease Mother    Kidney disease Mother        renal failure   Cancer Father        leukemia   Hypertension Other    Coronary artery disease Other        female 1st degree relative <60   Heart failure Other        congestive   Coronary artery disease Other        Female 1st degree relative <50   Breast cancer Other        1st degree relative <50 S   Breast cancer Sister    Colon cancer Brother 46   Stroke Brother    Heart attack Brother    Cancer Brother 32       colon ca   Esophageal cancer Neg Hx    Stomach cancer Neg Hx    Rectal cancer Neg Hx     Social History   Socioeconomic History   Marital status: Single    Spouse name: Not on file   Number of children: 1   Years of education: Not on file   Highest education level: 12th grade  Occupational History   Occupation: disabled  Tobacco Use   Smoking status: Former    Current packs/day: 0.00    Average packs/day: 0.5 packs/day for 16.0 years (8.0  ttl pk-yrs)    Types: Cigarettes    Start date: 56    Quit date: 2004    Years since quitting: 21.5   Smokeless tobacco: Never   Tobacco comments:    quit smoking in 2004  Vaping Use   Vaping status: Never Used  Substance and Sexual Activity   Alcohol  use: Not Currently    Alcohol /week: 1.0 standard drink of alcohol     Types: 1 Glasses of wine per week   Drug use: No   Sexual activity: Not Currently    Birth control/protection: Post-menopausal  Other Topics Concern   Not on file  Social History Narrative   Single, broke up with partner in 2008.      Lives at home alone/2025   Caffeine: stopped 2009   Social Drivers of Health   Financial Resource Strain: Low Risk  (05/01/2023)   Overall Financial Resource Strain (CARDIA)    Difficulty of Paying Living Expenses: Not hard at all  Food Insecurity: No Food Insecurity (05/01/2023)   Hunger Vital Sign    Worried About Running Out of Food in the Last Year: Never true    Ran Out of Food in the Last Year: Never true  Transportation Needs: Unmet Transportation Needs (05/01/2023)   PRAPARE - Administrator, Civil Service (Medical): Yes    Lack of Transportation (Non-Medical): Yes  Physical Activity: Unknown (05/01/2023)   Exercise Vital Sign    Days of Exercise per Week: Patient declined    Minutes of Exercise per Session: 0 min  Recent Concern:  Physical Activity - Inactive (04/30/2023)   Exercise Vital Sign    Days of Exercise per Week: 0 days    Minutes of Exercise per Session: 0 min  Stress: Stress Concern Present (05/01/2023)   [employer] of Occupational Health - Occupational Stress Questionnaire    Feeling of Stress : Very much  Social Connections: Socially Isolated (05/01/2023)   Social Connection and Isolation Panel    Frequency of Communication with Friends and Family: Once a week    Frequency of Social Gatherings with Friends and Family: Never    Attends Religious Services: Never    Doctor, general practice or Organizations: No    Attends Banker Meetings: Never    Marital Status: Never married  Intimate Partner Violence: Patient Unable To Answer (04/30/2023)   Humiliation, Afraid, Rape, and Kick questionnaire    Fear of Current or Ex-Partner: Patient unable to answer    Emotionally Abused: Patient unable to answer    Physically Abused: Patient unable to answer    Sexually Abused: Patient unable to answer    Physical Exam: Vital signs in last 24 hours: @BP  (!) 149/61   Pulse 68   Temp (!) 97.4 F (36.3 C) (Temporal)   Resp 13   Ht 5' 7 (1.702 m)   Wt 118.8 kg   LMP 05/19/2003   SpO2 98%   BMI 41.04 kg/m  GEN: NAD EYE: Sclerae anicteric ENT: MMM CV: Non-tachycardic Pulm: CTA b/l GI: Soft, NT/ND NEURO:  Alert & Oriented x 3   Gordy Starch, MD [facility]  07/15/2023 9:34 AM

## 2023-07-15 NOTE — Op Note (Signed)
 [facility] Patient Name: [name] Procedure Date: 07/15/2023 MRN: [id number] Attending MD: Gordy CHRISTELLA Starch , MD, [id number] Date of Birth: October 02, 1960 CSN: [id number] Age: 63 Admit Type: Outpatient Procedure:                Colonoscopy Indications:              Surveillance: Personal history of adenomatous                            polyps on last colonoscopy 3 years ago, Family                            history of colon cancer in a first-degree relative                            before age 75 years, Last colonoscopy: October                            2021; Incidental ongoing chronic diarrhea with out                            response to cholestyramine  though she notes she is                            not taking this daily; previously positive fecal                            lactoferrin but negative for microscopic colitis by                            biopsy at last colonoscopy Providers:                Gordy CHRISTELLA. Starch, MD, Clotilda Schmitz, RN, Jasmine                            [place], Technician, Almarie Hopping, CRNA Referring MD:             Karlynn GAILS. Plotnikov, MD Medicines:                Monitored Anesthesia Care Complications:            No immediate complications. Estimated Blood Loss:     Estimated blood loss was minimal. Procedure:                Pre-Anesthesia Assessment:                           - Prior to the procedure, a History and Physical                            was performed, and patient medications and                            allergies were reviewed. The patient's tolerance of  previous anesthesia was also reviewed. The risks                            and benefits of the procedure and the sedation                            options and risks were discussed with the patient.                            All questions were answered, and informed consent                            was obtained. Prior  Anticoagulants: The patient has                            taken Xarelto  (rivaroxaban ), last dose was 2 days                            prior to procedure. ASA Grade Assessment: III - A                            patient with severe systemic disease. After                            reviewing the risks and benefits, the patient was                            deemed in satisfactory condition to undergo the                            procedure.                           After obtaining informed consent, the colonoscope                            was passed under direct vision. Throughout the                            procedure, the patient's blood pressure, pulse, and                            oxygen  saturations were monitored continuously. The                            CF-HQ190L ([id number]) Olympus colonoscope was                            introduced through the anus and advanced to the                            cecum, identified by appendiceal orifice and  ileocecal valve. The colonoscopy was performed                            without difficulty. The patient tolerated the                            procedure well. The quality of the bowel                            preparation was good. The ileocecal valve,                            appendiceal orifice, and rectum were photographed. Scope In: 9:58:05 AM Scope Out: 10:13:16 AM Scope Withdrawal Time: 0 hours 10 minutes 43 seconds  Total Procedure Duration: 0 hours 15 minutes 11 seconds  Findings:      The digital rectal exam was normal.      Multiple medium-mouthed and small-mouthed diverticula were found in the       sigmoid colon and descending colon.      The exam was otherwise without abnormality.      Biopsies for histology were taken with a cold forceps from the right       colon and left colon for evaluation of microscopic colitis. Impression:               - Moderate diverticulosis in the sigmoid  colon and                            in the descending colon.                           - The examination was otherwise normal.                           - Biopsies were taken with a cold forceps from the                            right colon and left colon for evaluation of                            microscopic colitis. Moderate Sedation:      N/A Recommendation:           - Patient has a contact number available for                            emergencies. The signs and symptoms of potential                            delayed complications were discussed with the                            patient. Return to normal activities tomorrow.                            Written discharge instructions were provided to the  patient.                           - Resume previous diet.                           - Continue present medications.                           - Resume Xarelto  (rivaroxaban ) at prior dose today.                            Refer to managing physician for further adjustment                            of therapy.                           - Await pathology results.                           - Begin using cholestyramine  4 g on a daily basis                            to control chronic diarrhea in the setting of                            cholecystectomy. Separate this medication from                            other meds by 2 hours on either side of the dose.                           - Repeat colonoscopy in 5 years for surveillance. Procedure Code(s):        --- Professional ---                           [phone], Colonoscopy, flexible; with biopsy, single                            or multiple Diagnosis Code(s):        --- Professional ---                           Z86.010, Personal history of colonic polyps                           Z80.0, Family history of malignant neoplasm of                            digestive organs                            K57.30, Diverticulosis of large intestine without  perforation or abscess without bleeding CPT copyright 2022 American Medical Association. All rights reserved. The codes documented in this report are preliminary and upon coder review may  be revised to meet current compliance requirements. Gordy CHRISTELLA Starch, MD 07/15/2023 10:38:03 AM This report has been signed electronically. Number of Addenda: 0

## 2023-07-16 LAB — SURGICAL PATHOLOGY

## 2023-07-17 ENCOUNTER — Ambulatory Visit: Payer: Self-pay | Admitting: Internal Medicine

## 2023-07-19 ENCOUNTER — Other Ambulatory Visit: Payer: Self-pay | Admitting: Internal Medicine

## 2023-07-20 IMAGING — DX DG CHEST 2V
2 series · 2 of 2 positions shown · non-contrast
Comparison: 05/15/2020

CLINICAL DATA: History of sarcoidosis

EXAM:
CHEST - 2 VIEW

[chest pa]
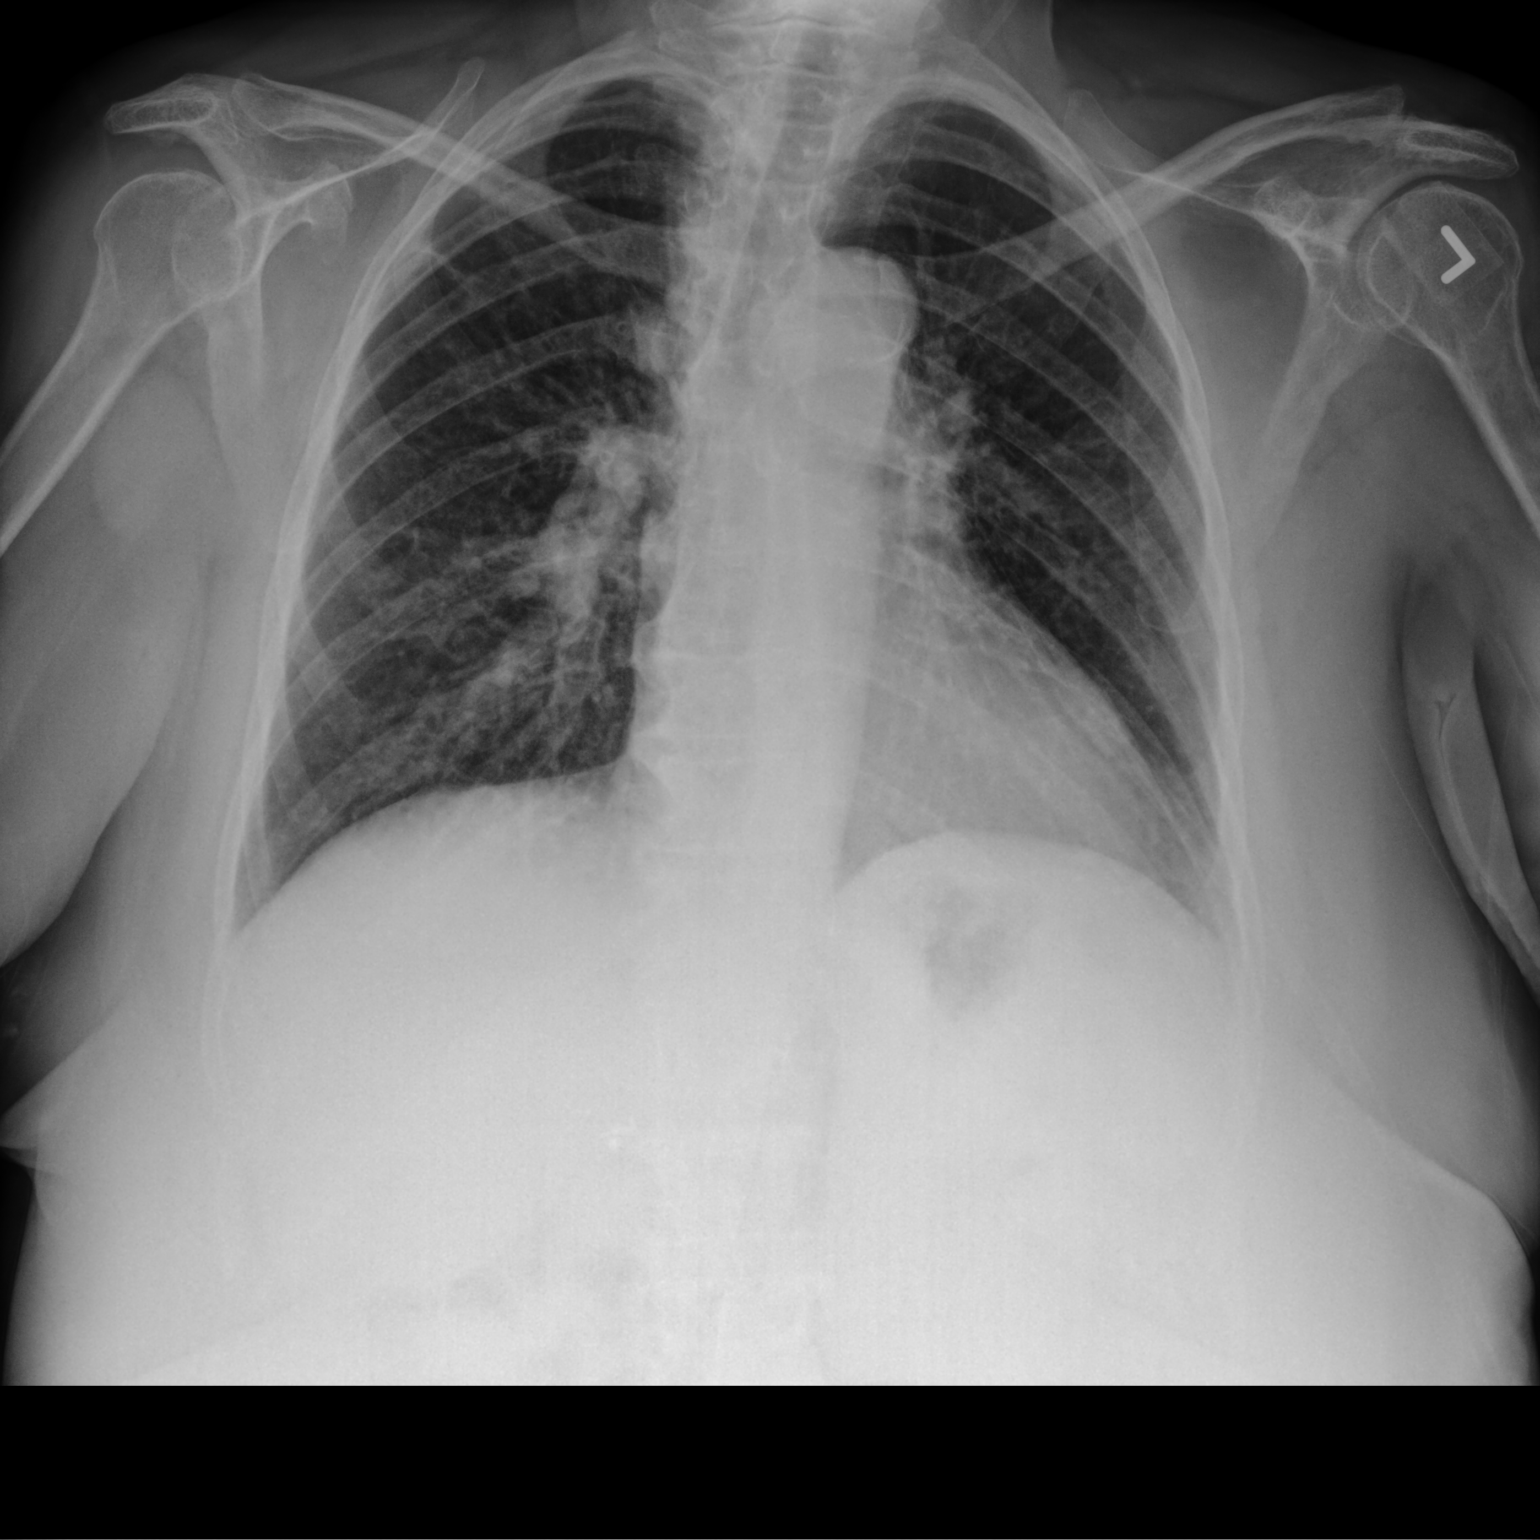

[chest lat]
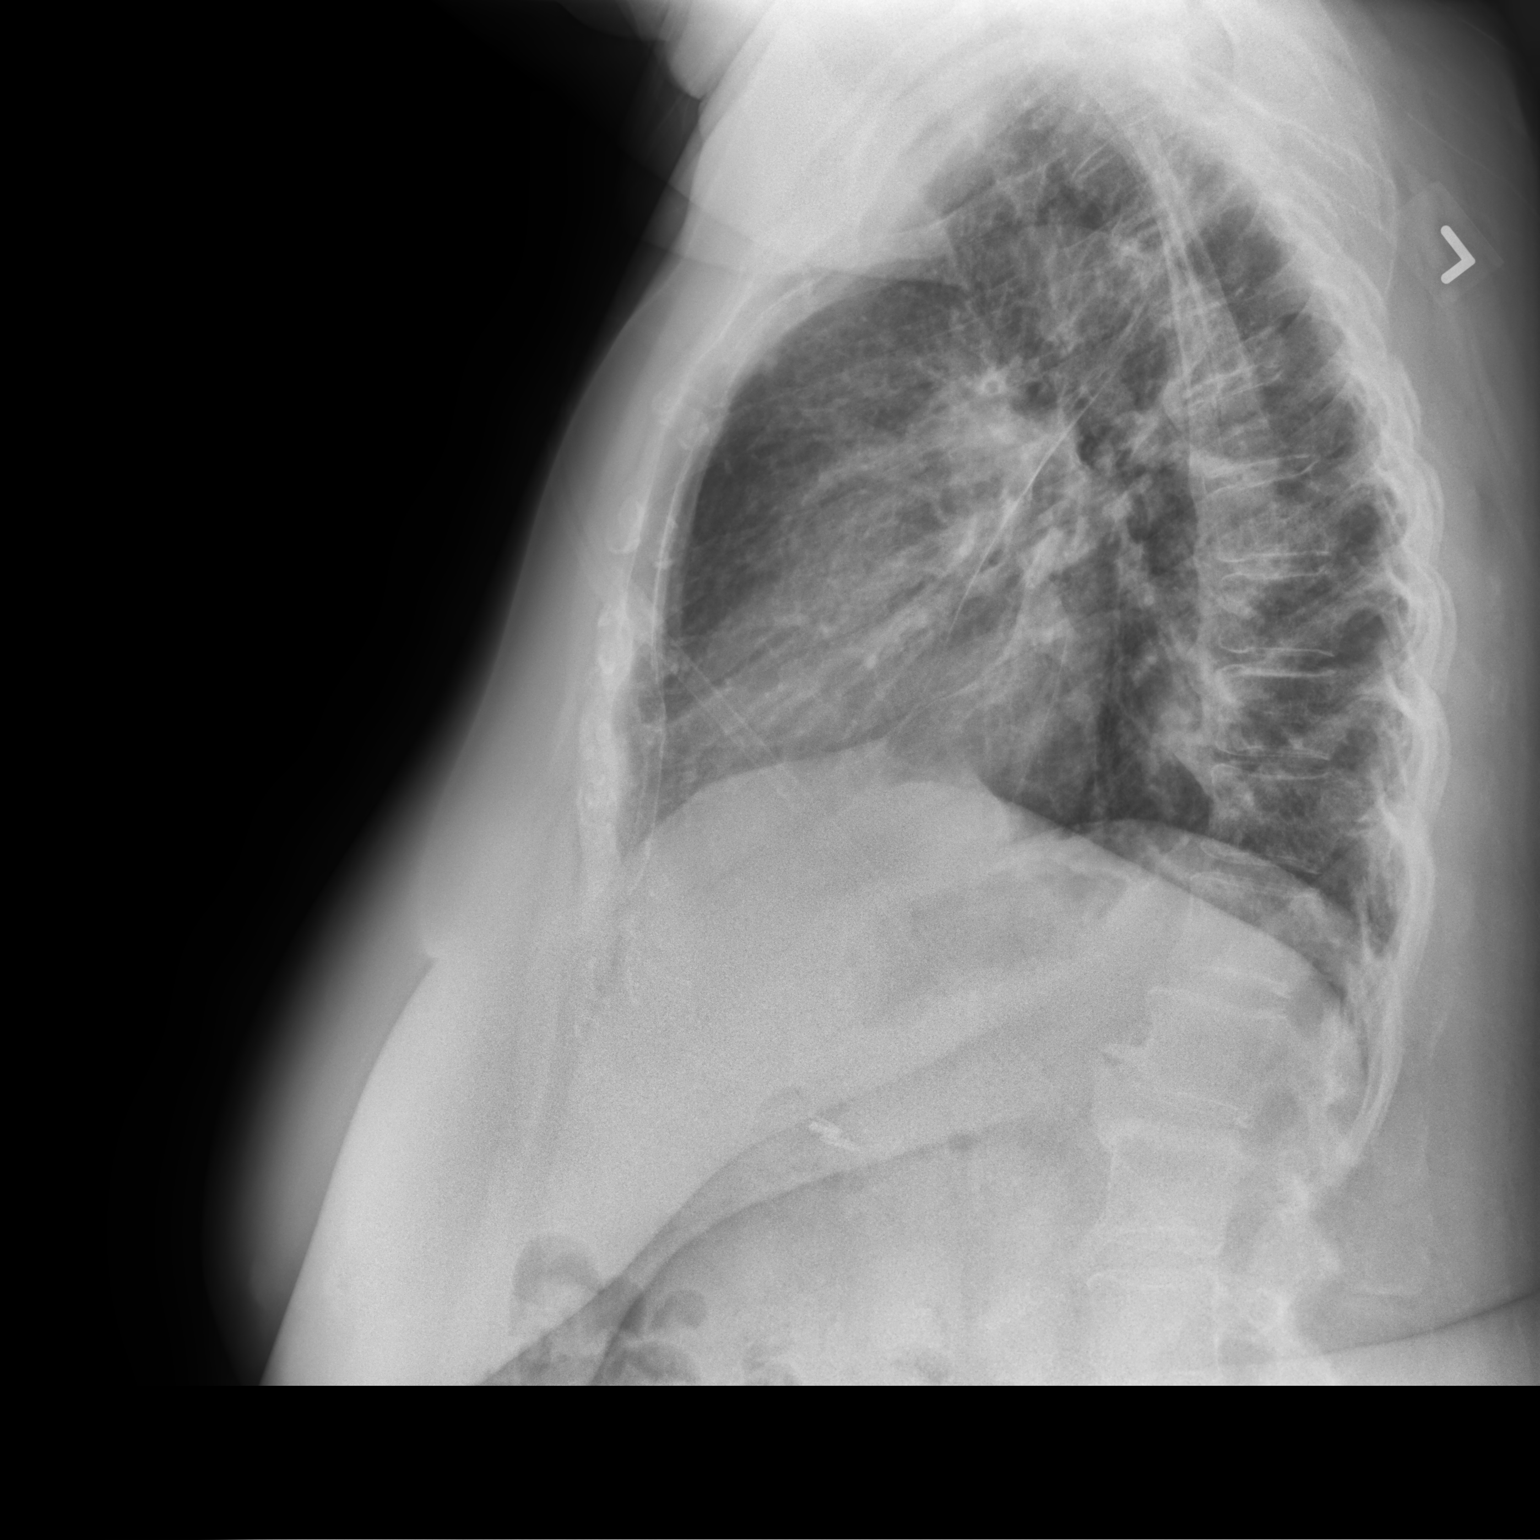

[2 of 2 positions shown; findings below may reference images not displayed]

FINDINGS: Cardiac shadow is stable. Aortic calcifications are noted. The lungs
are well aerated bilaterally. No focal infiltrate or effusion is
seen. Calcified mediastinal nodes are noted consistent with the
given clinical history. Degenerative changes of the thoracic spine
are noted.
IMPRESSION: Changes consistent with the given clinical history of sarcoidosis
stable from the prior study. No acute abnormality noted.

## 2023-07-23 NOTE — Unmapped (Signed)
 [facility] Special[facility] and Home Delivery Pharmacy Refill Coordination Note    Specialty Medication(s) to be Shipped:   Inflammatory Disorders: Humira     Other medication(s) to be shipped: No additional medications requested for fill at this time     Carolyn Newton, DOB: July 17, 1960  Phone: [phone] (home)       All above HIPAA information was verified with patient.     Was a Nurse, learning disability used for this call? No    Completed refill call assessment today to schedule patient's medication shipment from the [facility] and Home Delivery Pharmacy  [phone]).  All relevant notes have been reviewed.     Specialty medication(s) and dose(s) confirmed: Regimen is correct and unchanged.   Changes to medications: Clotee reports no changes at this time.  Changes to insurance: No  New side effects reported not previously addressed with a pharmacist or physician: None reported  Questions for the pharmacist: No    Confirmed patient received a Conservation officer, historic buildings and a Surveyor, mining with first shipment. The patient will receive a drug information handout for each medication shipped and additional FDA Medication Guides as required.       DISEASE/MEDICATION-SPECIFIC INFORMATION        For patients on injectable medications: Patient currently has 1 doses left.  Next injection is scheduled for 7/9.    SPECIALTY MEDICATION ADHERENCE     Medication Adherence    Patient reported X missed doses in the last month: 1  Specialty Medication: HUMIRA (CF) PEN 40 mg/0.4 mL injection (adalimumab )  Patient is on additional specialty medications: No  Informant: patient  Reasons for non-adherence: patient forgets   Other non-adherence reason: Pt missed dose last week, will take today 7/9                 Were doses missed due to medication being on hold? No    HUMIRA  PEN CITRATE FREE 40 MG/0.4 ML: 1 doses of medicine on hand       REFERRAL TO PHARMACIST     Referral to the pharmacist: Not needed      [facility]     Shipping address confirmed in Epic.     Cost and Payment: Patient has a $0 copay, payment information is not required.    Delivery Scheduled: Yes, Expected medication delivery date: 7/11.     Medication will be delivered via UPS to the prescription address in Epic WAM.    Carolyn Newton [facility]

## 2023-07-24 MED FILL — HUMIRA PEN CITRATE FREE 40 MG/0.4 ML: SUBCUTANEOUS | 28 days supply | Qty: 4 | Fill #10

## 2023-07-25 ENCOUNTER — Encounter (HOSPITAL_BASED_OUTPATIENT_CLINIC_OR_DEPARTMENT_OTHER): Payer: Self-pay | Admitting: Adult Health

## 2023-07-25 ENCOUNTER — Other Ambulatory Visit: Payer: Self-pay | Admitting: Endocrinology

## 2023-07-25 ENCOUNTER — Telehealth (HOSPITAL_BASED_OUTPATIENT_CLINIC_OR_DEPARTMENT_OTHER): Admitting: Adult Health

## 2023-07-25 VITALS — Wt 262.0 lb

## 2023-07-25 DIAGNOSIS — D869 Sarcoidosis, unspecified: Secondary | ICD-10-CM

## 2023-07-25 DIAGNOSIS — E1122 Type 2 diabetes mellitus with diabetic chronic kidney disease: Secondary | ICD-10-CM

## 2023-07-25 MED ORDER — TOBRAMYCIN 0.3 % OP SOLN: 1.0000 [drp] | Freq: Four times a day (QID) | OPHTHALMIC | 0 refills | Status: AC

## 2023-07-25 NOTE — Patient Instructions (Addendum)
 Continue on Symbicort  2 puffs Twice daily, rinse after use  Saline nasal spray Twice daily   Saline nasal gel At bedtime   May use Tobramycin  eye drop 1 drop every 6hr for 1 week.  Refer to Ophthalmology -placed.  Albuterol  inhaler or Neb As needed   Continue on Xarelto  20mg  daily  Avoid NSAIDS, aleve, advil , or motrin .  Mucinex Twice daily  As needed  Cough/congestion  Delsym 2 tsp Twice daily for cough.  Continue on Oxygen  2l/m.  Activity as tolerated.  Work on healthy weight loss.  CT chest in August 2025 .  Follow up Dr. Jude in 4-6 months and As needed

## 2023-07-25 NOTE — Progress Notes (Signed)
 Virtual Visit via Video Note  I connected with [name] on 07/25/23 at  1:30 PM EDT by a video enabled telemedicine application and verified that I am speaking with the correct person using two identifiers.  Location: Patient: Home  Provider: Office    I discussed the limitations of evaluation and management by telemedicine and the availability of in person appointments. The patient expressed understanding and agreed to proceed.  History of Present Illness: 63 year old female former smoker followed for pulmonary and cutaneous sarcoidosis, chronic bronchitis, lung nodules, recurrent pulmonary embolism on lifelong anticoagulation therapy, chronic respiratory failure on oxygen .  History of sleep apnea-CPAP intolerant Medical history significant for chronic back pain secondary to spinal stenosis  Today's video visit is a 32-month follow-up.  Patient is followed for cutaneous and pulmonary sarcoidosis.  She is followed by dermatology for her cutaneous sarcoid.  She remains on Humira.  Overall says she is doing okay.  Has CT chest planned next month.   She is followed at [facility] ophthalmology for chronic eye issues. Has been having trouble with eye redness . Has appointment next week for persistent eye symptoms.  Needs a new eye doctor does not want to go back to [facility]   Patient has chronic bronchitis remains on Symbicort  twice daily.  Says her breathing is doing well with no flare of cough or wheezing .  she was referred to pulmonary rehab last visit. Was not able to start due to having to keep her grandson, under stress. [name] has autism.  Does have some sinus drainage and congestion .   She has chronic respiratory failure on oxygen  at 2 L. No increased oxygen  demands.   She has a history of recurrent PE on lifelong anticoagulation therapy with Xarelto .  Patient education given.  That she is doing well.  Denies any known bleeding.      Observations/Objective: .07/25/2023 -appears in NAD, on O2 . Right eye does appear slightly red   2018 started on Humira for reoccurring cutaneous lesions not responding to methotrexate. Previously on prednisone  but tapered off.   Admitted 07/21/2016 for RLE DVT-had stopped Xarelto  04/2016 for prior pulmonary embolism   CT scan = chronic appearing subsegmental PE , on Xarelto  lifelong    PFT 05/2014 >> FVC 65%, ratio 69, FEV1 58%, DLCO 62%   VQ Scan 08/17/15 >> LLL & RUL acute PE    ECHO 08/18/15 >> mild LVH , EF 55-60%, Gr 1 DD .    LE Venous Doppler 08/18/15 >> neg DVT , no bakers cyst, ?prominent inguinal lymph node measuring 3.1cm    MRI brain neg [facility] = MRI spine = mild to moderate disc protrusion/left-sided spinal stenosis-chronic pain   NPSG 12/2004 mild, RDI 13/h   SARS-CoV-2 July 23, 2018- negative   CT chest July 2020 showed stable sarcoid to mildly decreased.(Nodular opacities (stable mediastinal and bilateral hilar adenopathy  decreased.(Nodular opacities (stable mediastinal and bilateral hilar adenopathy.     PFTs done in July 2020 showed stable lung function with FEV1 at 70%, ratio 81, FVC 67% and DLCO was 61%.  This was similar to 2016   PFTs 08/2022 postbronchodilator normal ratio 72, FVC 58%, FEV1 54%, TLC 65%, DLCO 62%/13.8    CT chest August 19, 2022 showed chronic lung changes of sarcoidosis with pulmonary scarring, superior segment left lower lobe process has improved likely resolving atelectasis measuring 3.1 x 1.2 cm.  Assessment and Plan: COPD appears stable.  Continue on current regimen  Sarcoidosis-pulmonary  and cutaneous.  Appears to be stable.  Chronic changes noted on CT scan.  Surveillance CT planned for next month.  Recurrent PE on lifelong anticoagulation therapy.  Continue on Xarelto .  Patient education given.  Chronic respiratory failure compensated on oxygen .  Continue on oxygen  to maintain O2 saturations greater than 88 to 9%  Acute  conjunctivitis-patient has multiple comorbidities including sarcoidosis.  Refer to ophthalmology.  Trial a course of tobramycin  eyedrops as she has used these before with perceived benefit.  Advised patient she needs to be seen by ophthalmologist for further evaluation and management.  Chronic rhinitis.  Continue on current regimen.  Saline nasal spray and gel as needed   Plan  Patient Instructions  Continue on Symbicort  2 puffs Twice daily, rinse after use  Saline nasal spray Twice daily   Saline nasal gel At bedtime   May use Tobramycin  eye drop 1 drop every 6hr for 1 week.  Refer to Ophthalmology -placed.  Albuterol  inhaler or Neb As needed   Continue on Xarelto  20mg  daily  Avoid NSAIDS, aleve, advil , or motrin .  Mucinex Twice daily  As needed  Cough/congestion  Delsym 2 tsp Twice daily for cough.  Continue on Oxygen  2l/m.  Activity as tolerated.  Work on healthy weight loss.  CT chest in August 2025 .  Follow up Dr. Jude in 4-6 months and As needed     Follow Up Instructions:    I discussed the assessment and treatment plan with the patient. The patient was provided an opportunity to ask questions and all were answered. The patient agreed with the plan and demonstrated an understanding of the instructions.   The patient was advised to call back or seek an in-person evaluation if the symptoms worsen or if the condition fails to improve as anticipated.  I provided 32  minutes of non-face-to-face time during this encounter.   Madelin Stank, NP

## 2023-07-25 NOTE — Telephone Encounter (Signed)
 Refill request complete

## 2023-07-29 DIAGNOSIS — J3489 Other specified disorders of nose and nasal sinuses: Secondary | ICD-10-CM | POA: Diagnosis not present

## 2023-07-29 DIAGNOSIS — D869 Sarcoidosis, unspecified: Secondary | ICD-10-CM | POA: Diagnosis not present

## 2023-07-29 DIAGNOSIS — J310 Chronic rhinitis: Secondary | ICD-10-CM | POA: Diagnosis not present

## 2023-07-29 DIAGNOSIS — H6012 Cellulitis of left external ear: Secondary | ICD-10-CM | POA: Diagnosis not present

## 2023-07-29 DIAGNOSIS — H109 Unspecified conjunctivitis: Secondary | ICD-10-CM | POA: Insufficient documentation

## 2023-07-29 DIAGNOSIS — H1033 Unspecified acute conjunctivitis, bilateral: Secondary | ICD-10-CM | POA: Diagnosis not present

## 2023-07-31 ENCOUNTER — Telehealth: Payer: Self-pay

## 2023-07-31 DIAGNOSIS — E785 Hyperlipidemia, unspecified: Secondary | ICD-10-CM

## 2023-07-31 NOTE — Telephone Encounter (Signed)
 Copied from CRM [id number]. Topic: Clinical - Medication Question >> Jul 31, 2023  2:58 PM Chiquita SQUIBB wrote: Reason for CRM: Patients pharmacy [facility] pharmacy is calling in to ask if the doctor can add in a cholesterol medication such as a satin medication. Please contact the pharmacy back with any questions, [phone].

## 2023-08-04 ENCOUNTER — Ambulatory Visit (INDEPENDENT_AMBULATORY_CARE_PROVIDER_SITE_OTHER): Admitting: Internal Medicine

## 2023-08-04 ENCOUNTER — Encounter: Payer: Self-pay | Admitting: Internal Medicine

## 2023-08-04 VITALS — BP 128/76 | HR 72 | Temp 97.1°F | Ht 67.0 in | Wt 260.0 lb

## 2023-08-04 DIAGNOSIS — J310 Chronic rhinitis: Secondary | ICD-10-CM | POA: Diagnosis not present

## 2023-08-04 DIAGNOSIS — H6012 Cellulitis of left external ear: Secondary | ICD-10-CM | POA: Diagnosis not present

## 2023-08-04 DIAGNOSIS — E109 Type 1 diabetes mellitus without complications: Secondary | ICD-10-CM

## 2023-08-04 DIAGNOSIS — E559 Vitamin D deficiency, unspecified: Secondary | ICD-10-CM

## 2023-08-04 DIAGNOSIS — H10403 Unspecified chronic conjunctivitis, bilateral: Secondary | ICD-10-CM

## 2023-08-04 DIAGNOSIS — H6010 Cellulitis of external ear, unspecified ear: Secondary | ICD-10-CM | POA: Insufficient documentation

## 2023-08-04 DIAGNOSIS — J209 Acute bronchitis, unspecified: Secondary | ICD-10-CM | POA: Diagnosis not present

## 2023-08-04 DIAGNOSIS — E538 Deficiency of other specified B group vitamins: Secondary | ICD-10-CM

## 2023-08-04 MED ORDER — TOBRAMYCIN-DEXAMETHASONE 0.3-0.1 % OP SUSP: 1.0000 [drp] | Freq: Four times a day (QID) | OPHTHALMIC | 0 refills | Status: DC

## 2023-08-04 NOTE — Progress Notes (Signed)
 Subjective:  Patient ID: [name], female    DOB: 1960/11/16  Age: 63 y.o. MRN: [id number]  CC: Medical Management of Chronic Issues (3 mnth f/u, pt has been dx'd with cellulitis of her left ear... However, Pt was not able to receive her ABX and now has swelling in her face, throat, chest congestion, eye swelling and redness, vision changes, lost of voice, body aches, headaches nasal congestion. Pt states she fells as though her throat closing up and mucus is not coming out. Pt stated when she blows her nose that she sees blood and mucus )   HPI Mammie Meras presents for Lear pain, URI sx's, fatigue, eye pain Dr Carlie sent Cipro Rx on 07/29/23  - not delivered yet  Not on steroids  Per Dr Carlie: 1. Ozena and septal perforation. Again, the nasal passages were cleared of crusting. She appears to have active sarcoidosis. I recommended further discussion with her PCP regarding treatment options. I instructed her to resume twice daily saline irrigation with budesonide  mixed in. She will follow-up in two months.  2. Bilateral conjunctivitis. Advised to contact ophthalmologist, Dr. Lonni Gaudy, for further evaluation and specific treatment.  3. Left auricular cellulitis. Left ear appears red, puffy, and crusty. An oral antibiotic, Ciprofloxacin, was prescribed to address the outer ear infection.   Outpatient Medications Prior to Visit  Medication Sig Dispense Refill   ACCU-CHEK GUIDE TEST test strip USSE TO CHECK BLOOD SUGAR MORNING, NOON, AND AT BEDTIME- THREE TIMES DAILY 100 strip 3   Accu-Chek Softclix Lancets lancets USE TO CHECK BLOOD SUGAR MORNING, NOON, AND AT BEDTIME- THREE TIMES DAILY 100 each 3   acetaminophen  (TYLENOL ) 325 MG tablet Take 1-2 tablets (325-650 mg total) by mouth every 6 (six) hours as needed for mild pain (pain score 1-3 or temp > 100.5). 30 tablet 1   albuterol  (PROVENTIL ) (2.5 MG/3ML) 0.083% nebulizer solution Take 3 mLs (2.5 mg  total) by nebulization every 6 (six) hours as needed for wheezing or shortness of breath. 75 mL 5   albuterol  (VENTOLIN  HFA) 108 (90 Base) MCG/ACT inhaler INHALE TWO puffs into THE lungs EVERY SIX HOURS AS NEEDED wheezind AND For SHORTNESS OF BREATH 6.7 g 3   Blood Glucose Monitoring Suppl DEVI 1 each by Does not apply route in the morning, at noon, and at bedtime. May substitute to any manufacturer covered by patient's insurance. 1 each 0   budesonide -formoterol  (SYMBICORT ) 80-4.5 MCG/ACT inhaler Inhale 2 puffs into the lungs 2 (two) times daily. INHALE TWO puffs into THE lungs TWICE DAILY 1 each 12   carvedilol  (COREG ) 12.5 MG tablet TAKE ONE TABLET BY MOUTH TWICE DAILY With meals 180 tablet 3   Cholecalciferol  (VITAMIN D -3) 125 MCG (5000 UT) TABS Take 5,000 Units by mouth daily.     cholestyramine  (QUESTRAN ) 4 g packet Take 1 packet (4 g total) by mouth daily. 30 packet 5   ciprofloxacin (CIPRO) 500 MG tablet Take 500 mg by mouth.     colesevelam  (WELCHOL ) 625 MG tablet TAKE TWO TABLETS (1250MG  TOTAL) BY MOUTH TWICE DAILY With meals 120 tablet 1   Cyanocobalamin  (VITAMIN B-12) 1000 MCG SUBL Place 1 tablet (1,000 mcg total) under the tongue daily. 100 tablet 3   dexlansoprazole  (DEXILANT ) 60 MG capsule TAKE ONE CAPSULE BY MOUTH EVERY DAY 90 capsule 1   diphenoxylate -atropine  (LOMOTIL ) 2.5-0.025 MG tablet Take 1-2 tablets by mouth 4 (four) times daily as needed for diarrhea or loose stools. 60 tablet 1  gabapentin  (NEURONTIN ) 300 MG capsule TAKE 1 CAPSULE BY MOUTH EVERY MORNING. MAY TAKE SECOND CAPSULE DURING THE DAY AS NEEDED FOR PAIN 180 capsule 1   glucose blood (ACCU-CHEK GUIDE) test strip Use to check blood sugar once a day. 100 each 12   Glucose Blood (BLOOD GLUCOSE TEST STRIPS) STRP Use to check blood sugars every day. May substitute to any manufacturer covered by patient's insurance. 100 each 3   HUMIRA PEN 40 MG/0.4ML PNKT Inject 40 mg as directed every Wednesday.      hydrOXYzine  (ATARAX )  25 MG tablet TAKE ONE TABLET pop EVERY EIGHT HOURS AS NEEDED 60 tablet 3   Lancets Misc. MISC Use to check blood sugars every day. May substitute to any manufacturer covered by patient's insurance. 100 each 3   metFORMIN  (GLUCOPHAGE -XR) 500 MG 24 hr tablet Take 1 tablet (500 mg total) by mouth daily with supper. 90 tablet 3   Multiple Vitamins-Minerals (MULTIVITAMIN WOMEN 50+) TABS Take 1 tablet by mouth daily.     naltrexone (DEPADE) 50 MG tablet Take 50 mg by mouth daily.     OXYGEN  Inhale 2 L into the lungs continuous.     potassium chloride  (KLOR-CON  M) 10 MEQ tablet TAKE ONE TABLET BY MOUTH EVERY DAY 90 tablet 1   QUEtiapine  (SEROQUEL  XR) 200 MG 24 hr tablet Take 1 tablet (200 mg total) by mouth at bedtime. ** STOP 300mg  er tablet ** 30 tablet 5   QUEtiapine  (SEROQUEL ) 50 MG tablet TAKE ONE TABLET BY MOUTH EVERY MORNING AND TAKE TWO TABLETS BY MOUTH AT BEDTIME 90 tablet 5   sulfamethoxazole -trimethoprim  (BACTRIM  DS) 800-160 MG tablet TAKE ONE TABLET BY MOUTH TWICE DAILY 28 tablet 1   tobramycin  (TOBREX ) 0.3 % ophthalmic solution SMARTSIG:In Eye(s)     topiramate  (TOPAMAX ) 50 MG tablet TAKE ONE TABLET BY MOUTH TWICE DAILY 180 tablet 1   venlafaxine  XR (EFFEXOR -XR) 75 MG 24 hr capsule TAKE ONE CAPSULE BY MOUTH EVERY DAY WITH breakfast 90 capsule 3   XARELTO  20 MG TABS tablet TAKE ONE TABLET BY MOUTH EVERY DAY 90 tablet 3   clotrimazole -betamethasone  (LOTRISONE ) cream APPLY TO THE AFFECTED AREA(S) TWICE DAILY (Patient not taking: Reported on 08/04/2023) 90 g 1   naloxone  (NARCAN ) nasal spray 4 mg/0.1 mL Place 1 spray into the nose once as needed (overdose). (Patient not taking: Reported on 08/04/2023)     tobramycin -dexamethasone  (TOBRADEX ) ophthalmic solution Place 1 drop into both eyes every 6 (six) hours. (Patient not taking: Reported on 08/04/2023) 5 mL 0   No facility-administered medications prior to visit.    ROS: Review of Systems  Constitutional:  Positive for fatigue. Negative for  activity change, appetite change, chills, diaphoresis and unexpected weight change.  HENT:  Positive for postnasal drip, rhinorrhea, sinus pressure, sinus pain, sore throat and voice change. Negative for congestion and mouth sores.   Eyes:  Positive for pain and redness. Negative for visual disturbance.  Respiratory:  Positive for cough. Negative for chest tightness.   Cardiovascular:  Negative for leg swelling.  Gastrointestinal:  Negative for abdominal pain and nausea.  Genitourinary:  Negative for difficulty urinating, frequency and vaginal pain.  Musculoskeletal:  Positive for arthralgias, gait problem and neck stiffness. Negative for back pain.  Skin:  Negative for pallor and rash.  Neurological:  Positive for weakness. Negative for dizziness, tremors, numbness and headaches.  Psychiatric/Behavioral:  Negative for confusion, sleep disturbance and suicidal ideas. The patient is not nervous/anxious.     Objective:  BP 128/76  Pulse 72   Temp (!) 97.1 F (36.2 C) (Oral)   Ht 5' 7 (1.702 m)   Wt 260 lb (117.9 kg)   LMP 05/19/2003   SpO2 94%   BMI 40.72 kg/m   BP Readings from Last 3 Encounters:  08/04/23 128/76  07/15/23 (!) 154/74  05/07/23 (!) 130/90    Wt Readings from Last 3 Encounters:  08/04/23 260 lb (117.9 kg)  07/25/23 262 lb (118.8 kg)  07/15/23 262 lb (118.8 kg)    Physical Exam Constitutional:      General: She is not in acute distress.    Appearance: She is well-developed.  HENT:     Head: Normocephalic.     Right Ear: External ear normal.     Left Ear: External ear normal.     Nose: Nose normal.  Eyes:     General:        Right eye: No discharge.        Left eye: No discharge.     Conjunctiva/sclera: Conjunctivae normal.     Pupils: Pupils are equal, round, and reactive to light.  Neck:     Thyroid : No thyromegaly.     Vascular: No JVD.     Trachea: No tracheal deviation.  Cardiovascular:     Rate and Rhythm: Normal rate and regular rhythm.      Heart sounds: Normal heart sounds.  Pulmonary:     Effort: No respiratory distress.     Breath sounds: No stridor. No wheezing.  Abdominal:     General: Bowel sounds are normal. There is no distension.     Palpations: Abdomen is soft. There is no mass.     Tenderness: There is no abdominal tenderness. There is no guarding or rebound.  Musculoskeletal:     Cervical back: Normal range of motion and neck supple. Tenderness present. No rigidity.  Lymphadenopathy:     Cervical: No cervical adenopathy.  Skin:    Findings: No erythema or rash.  Neurological:     Cranial Nerves: No cranial nerve deficit.     Motor: No abnormal muscle tone.     Coordination: Coordination normal.     Deep Tendon Reflexes: Reflexes normal.  Psychiatric:        Behavior: Behavior normal.        Thought Content: Thought content normal.        Judgment: Judgment normal.   Non toxic or septic L ear swollen w/erythema  Lab Results  Component Value Date   WBC 8.5 05/05/2023   HGB 13.1 05/05/2023   HCT 40.0 05/05/2023   PLT 225.0 05/05/2023   GLUCOSE 90 05/05/2023   CHOL 196 04/21/2023   TRIG 137 04/21/2023   HDL 86 04/21/2023   LDLDIRECT 107.0 06/30/2017   LDLCALC 86 04/21/2023   ALT 24 05/05/2023   AST 20 05/05/2023   NA 140 05/05/2023   K 4.1 05/05/2023   CL 104 05/05/2023   CREATININE 0.92 05/05/2023   BUN 28 (H) 05/05/2023   CO2 29 05/05/2023   TSH 1.10 05/05/2023   INR 1.00 02/11/2018   HGBA1C 5.4 04/21/2023   MICROALBUR 0.4 06/29/2008    No results found.  Assessment & Plan:   Problem List Items Addressed This Visit     Acute bronchitis   Cipro po      B12 deficiency   On B12      Bilateral conjunctivitis   Tammy Parrett, NP Rx'd Tobramycin  eye gtt  start today  Cellulitis of ear - Primary   Left auricular cellulitis. Left ear appears red, puffy, and crusty. An oral antibiotic, Ciprofloxacin, was prescribed to address the outer ear infection.  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    Dispense:  5 mL    Refill:  0      Follow-up: Return in about 3 months (around 11/04/2023) for a follow-up visit.  Marolyn Noel, MD

## 2023-08-04 NOTE — Assessment & Plan Note (Signed)
 Madelin Stank, NP Rx'd Tobramycin  eye gtt  start today

## 2023-08-04 NOTE — Assessment & Plan Note (Signed)
 On Metformin  ER 500 mg/d only - does not seem to cause diarrhea

## 2023-08-04 NOTE — Assessment & Plan Note (Signed)
 Left auricular cellulitis. Left ear appears red, puffy, and crusty. An oral antibiotic, Ciprofloxacin, was prescribed to address the outer ear infection.  [name] will get Cipro po today

## 2023-08-04 NOTE — Telephone Encounter (Signed)
 The patient declined statins in the past.  Thanks

## 2023-08-04 NOTE — Assessment & Plan Note (Signed)
 Dr Carlie Start PO Cipro

## 2023-08-04 NOTE — Assessment & Plan Note (Signed)
Cipro po °

## 2023-08-04 NOTE — Patient Instructions (Signed)
Go to ER if worse 

## 2023-08-04 NOTE — Assessment & Plan Note (Signed)
 On Vit D

## 2023-08-04 NOTE — Assessment & Plan Note (Signed)
 On B12

## 2023-08-07 DIAGNOSIS — J449 Chronic obstructive pulmonary disease, unspecified: Secondary | ICD-10-CM | POA: Diagnosis not present

## 2023-08-13 ENCOUNTER — Other Ambulatory Visit: Payer: Self-pay | Admitting: Internal Medicine

## 2023-08-14 ENCOUNTER — Ambulatory Visit (INDEPENDENT_AMBULATORY_CARE_PROVIDER_SITE_OTHER): Admitting: Podiatry

## 2023-08-14 ENCOUNTER — Encounter: Payer: Self-pay | Admitting: Podiatry

## 2023-08-14 DIAGNOSIS — E1142 Type 2 diabetes mellitus with diabetic polyneuropathy: Secondary | ICD-10-CM | POA: Diagnosis not present

## 2023-08-14 DIAGNOSIS — B351 Tinea unguium: Secondary | ICD-10-CM

## 2023-08-14 DIAGNOSIS — M79674 Pain in right toe(s): Secondary | ICD-10-CM | POA: Diagnosis not present

## 2023-08-14 DIAGNOSIS — M79675 Pain in left toe(s): Secondary | ICD-10-CM | POA: Diagnosis not present

## 2023-08-14 DIAGNOSIS — M2041 Other hammer toe(s) (acquired), right foot: Secondary | ICD-10-CM

## 2023-08-14 DIAGNOSIS — M2010 Hallux valgus (acquired), unspecified foot: Secondary | ICD-10-CM

## 2023-08-14 DIAGNOSIS — M2042 Other hammer toe(s) (acquired), left foot: Secondary | ICD-10-CM

## 2023-08-14 NOTE — Addendum Note (Signed)
 Addended by: LOREDA HACKER on: 08/14/2023 02:13 PM   Modules accepted: Orders

## 2023-08-14 NOTE — Telephone Encounter (Unsigned)
 Copied from CRM [id number]. 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 Nat would like for Dr.Plotnikov to considered prescribing the pt a high or moderate dose of the statin and stated that she will be faxing over this information to the office today.

## 2023-08-14 NOTE — Progress Notes (Signed)
 This patient returns to my office for at risk foot care.  This patient requires this care by a professional since this patient will be at risk due to having diabetic neuropathy.. This patient is unable to cut nails herself since the patient cannot reach her nails.These nails are painful walking and wearing shoes.  This patient presents for at risk foot care today.  General Appearance  Alert, conversant and in no acute stress.  Vascular  Dorsalis pedis and posterior tibial  pulses are palpable  bilaterally.  Capillary return is within normal limits  bilaterally. Temperature is within normal limits  bilaterally.  Neurologic  Senn-Weinstein monofilament wire test within normal limits/diminished   bilaterally. Muscle power within normal limits bilaterally.  Nails Thick disfigured discolored nails with subungual debris  from hallux to fifth toes bilaterally. No evidence of bacterial infection or drainage bilaterally.  Orthopedic  No limitations of motion  feet .  No crepitus or effusions noted.  HAV  B/L.  Hammer Toes  B/L.  Skin  normotropic skin with no porokeratosis noted bilaterally.  No signs of infections or ulcers noted.     Onychomycosis  Pain in right toes  Pain in left toes  Consent was obtained for treatment procedures.   Mechanical debridement of nails 1-5  bilaterally performed with a nail nipper.  Filed with dremel without incident. 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 Contact [facility].   Return office visit   3 months for nail care.                  Told patient to return for periodic foot care and evaluation due to potential at risk complications.   Cordella Bold DPM

## 2023-08-14 NOTE — Telephone Encounter (Signed)
 Per PCP The patient declined statins in the past. Thanks

## 2023-08-15 ENCOUNTER — Telehealth: Payer: Self-pay | Admitting: Internal Medicine

## 2023-08-15 NOTE — Telephone Encounter (Signed)
 Patient dropped off document FMLA, to be filled out by provider. Patient requested to send it back via Fax within 7-days. Document is located in providers tray at front office.Please advise at Mobile [phone] (mobile)   Patient's son said his work needs it by 08/26/23.

## 2023-08-15 NOTE — Telephone Encounter (Signed)
 Paperwork has been received I will fill it out and place on PCP desk for review and signature.

## 2023-08-19 ENCOUNTER — Other Ambulatory Visit: Payer: Self-pay | Admitting: Internal Medicine

## 2023-08-20 ENCOUNTER — Ambulatory Visit: Payer: Self-pay | Admitting: Endocrinology

## 2023-08-20 ENCOUNTER — Encounter: Payer: Self-pay | Admitting: Endocrinology

## 2023-08-20 ENCOUNTER — Ambulatory Visit (INDEPENDENT_AMBULATORY_CARE_PROVIDER_SITE_OTHER): Admitting: Endocrinology

## 2023-08-20 VITALS — BP 124/72 | HR 61 | Resp 20 | Ht 67.0 in | Wt 256.4 lb

## 2023-08-20 DIAGNOSIS — N182 Chronic kidney disease, stage 2 (mild): Secondary | ICD-10-CM | POA: Diagnosis not present

## 2023-08-20 DIAGNOSIS — E1122 Type 2 diabetes mellitus with diabetic chronic kidney disease: Secondary | ICD-10-CM | POA: Diagnosis not present

## 2023-08-20 LAB — POCT GLYCOSYLATED HEMOGLOBIN (HGB A1C): Hemoglobin A1C: 5.6 % (ref 4.0–5.6)

## 2023-08-20 NOTE — Patient Instructions (Signed)
 Trial of holding metformin , to see if stomach issue and diarrhea improves.

## 2023-08-20 NOTE — Progress Notes (Signed)
 Outpatient Endocrinology Note [place] Zahraa Bhargava, MD   Patient's Name: [name] [facility]    DOB: 1960/02/24    MRN: [id number]                                                    REASON OF VISIT: Type 2 diabetes mellitus  REFERRING PROVIDER: Plotnikov, Karlynn GAILS, MD  PCP: Garald Karlynn GAILS, MD  HISTORY OF PRESENT ILLNESS:   [name] is a 63 y.o. old female with past medical history listed below, is here for follow up for type 2 diabetes mellitus.   Pertinent Diabetes History: Patient was diagnosed with type 2 diabetes mellitus in 2012/2013 timeframe, patient reports it was initially long-term use of steroid induced type 2 diabetes mellitus.  She was managed with insulin  therapy in the past several years ago, stopped around 2015, lately has been only taking metformin  daily.  Patient has history of sarcoidosis, used to be on long-term glucocorticoid therapy.  She had hemoglobin A1c as high as 16.4% in 2013.  In last few months she has controlled type 2 diabetes mellitus with hemoglobin A1c in the range of 5.4 to 6% range.  Chronic Diabetes Complications : Retinopathy: no. Last ophthalmology exam was done on 11/2022, following with ophthalmology regularly.  Nephropathy: no Peripheral neuropathy: no Coronary artery disease: no Stroke: no  Relevant comorbidities and cardiovascular risk factors: Obesity: yes Body mass index is 40.16 kg/m.  Hypertension: Yes  Hyperlipidemia : Yes, not on statin   Current / Home Diabetic regimen includes:  Metformin  extended release 500 mg 1 tablet daily.  Prior diabetic medications: Insulin  therapy in the remote past.  Glycemic data:   No glucose data to review.  She forgot to bring glucometer in the clinic today.  She reports her blood sugar in the morning fasting 95-108 range.  Hypoglycemia: Patient has no hypoglycemic episodes. Patient has hypoglycemia awareness.  Factors modifying glucose control: 1.  Diabetic diet  assessment: 2-3 times a day.   2.  Staying active or exercising: No formal exercise.  3.  Medication compliance: compliant all of the time.  Interval history  Hemoglobin A1c today 5.6%.  She has been taking metformin .  She reports she occasionally gets diarrhea and stomach upset.  She had also seen gastroenterology and had colonoscopy with unremarkable findings.  She has no other complaints today.   REVIEW OF SYSTEMS As per history of present illness.   PAST MEDICAL HISTORY: Past Medical History:  Diagnosis Date   ALLERGIC RHINITIS 10/26/2009   no current problems   Anxiety    B12 DEFICIENCY 08/25/2007   takes B12 supplement   Complication of anesthesia    pt has had difficulty following anesthesia with her knee in 2016-unable to care for herself afterward   Depression    takes effexor  xr daily   Diabetes mellitus without complication (HCC)    was on insulin  but has been off since Nov 2015 and now only takes Metformin  daily   DYSPNEA 04/28/2009   uses oxygen  24/7, on 2L via [place]   Esophageal reflux    takes Nexium  daily   Fibromyalgia    Headache    last migraine 2-37yrs ago;takes Topamax  daily   History of shingles    Hypertension    takes Coreg  daily   Insomnia    takes Nortriptyline  nightly  Long-term memory loss    OSA (obstructive sleep apnea)    doesn't use CPAP;sleep study in epic from 2006   Osteoarthritis    Panic attacks    PONV (postoperative nausea and vomiting)    on time in 2016 knee surgery   Rheumatoid arthritis (HCC)    Sarcoidosis    Dr. Zeminski   Stroke [facility])    Vitamin D  deficiency    is supposed to take Vit D but can't afford it    PAST SURGICAL HISTORY: Past Surgical History:  Procedure Laterality Date   APPENDECTOMY     arthroscopic knee surgery Right 11-12-04   AXILLARY ABCESS IRRIGATION AND DEBRIDEMENT  Jul & [id number]   BIOPSY  11/02/2019   Procedure: BIOPSY;  Surgeon: Albertus Gordy HERO, MD;  Location: [facility];  Service:  Gastroenterology;;   ORIN MEDIATE RELEASE Left 05/23/2014   Procedure: CARPAL TUNNEL RELEASE;  Surgeon: Glendia Cordella Hutchinson, MD;  Location: [facility] OR;  Service: Orthopedics;  Laterality: Left;   CHOLECYSTECTOMY N/A 02/11/2018   Procedure: LAPAROSCOPIC CHOLECYSTECTOMY WITH INTRAOPERATIVE CHOLANGIOGRAM ERAS PATHWAY;  Surgeon: Belinda Cough, MD;  Location: [facility] OR;  Service: General;  Laterality: N/A;   COLONOSCOPY N/A 07/15/2023   Procedure: COLONOSCOPY;  Surgeon: Albertus Gordy HERO, MD;  Location: [facility];  Service: Gastroenterology;  Laterality: N/A;   COLONOSCOPY WITH PROPOFOL  N/A 11/02/2019   Procedure: COLONOSCOPY WITH PROPOFOL ;  Surgeon: Albertus Gordy HERO, MD;  Location: [facility];  Service: Gastroenterology;  Laterality: N/A;   cyst removed from top of buttocks  at age 55   DACRORHINOCYSTOTOMY Bilateral 03/23/2021   Procedure: ENDOSCOPIC DACROCYSTORHINOSTOMY;  Surgeon: Rodgers Faden, MD;  Location: [facility] OR;  Service: Ophthalmology;  Laterality: Bilateral;   ENDOMETRIAL ABLATION     IR CHOLANGIOGRAM EXISTING TUBE  11/21/2017   IR CHOLANGIOGRAM EXISTING TUBE  01/16/2018   IR EXCHANGE BILIARY DRAIN  11/10/2017   IR PATIENT EVAL TECH 0-60 MINS  02/03/2018   IR PERC CHOLECYSTOSTOMY  09/13/2017   IR RADIOLOGIST EVAL & MGMT  10/08/2017   LACRIMAL DUCT EXPLORATION Right 06/26/2017   Procedure: LACRIMAL DUCT EXPLORATION AND ETHMOIDECTOMY;  Surgeon: Laurie Loyd Redhead, MD;  Location: [facility] OR;  Service: Ophthalmology;  Laterality: Right;   LACRIMAL DUCT EXPLORATION Bilateral 03/23/2021   Procedure: NASAL LACRIMAL DUCT EXPLORATION;  Surgeon: Rodgers Faden, MD;  Location: [facility] OR;  Service: Ophthalmology;  Laterality: Bilateral;   LACRIMAL TUBE INSERTION Bilateral 03/23/2021   Procedure: INSERTION OF CRAWFORD LACRIMAL STENT;  Surgeon: Rodgers Faden, MD;  Location: [facility] OR;  Service: Ophthalmology;  Laterality: Bilateral;   ORIF FEMUR FRACTURE Left 07/31/2021   Procedure: left medial femoral condyle fracture fixation;   Surgeon: Hutchinson Cordella Glendia, MD;  Location[facility];  Service: Orthopedics;  Laterality: Left;   POLYPECTOMY  11/02/2019   Procedure: POLYPECTOMY;  Surgeon: Albertus Gordy HERO, MD;  Location: [facility];  Service: Gastroenterology;;   TEAR DUCT PROBING Right 06/26/2017   Procedure: TEAR DUCT PROBING WITH STENT;  Surgeon: Laurie Loyd Redhead, MD;  Location: [facility] OR;  Service: Ophthalmology;  Laterality: Right;   TOTAL KNEE ARTHROPLASTY Right 11/15/2014   Procedure: TOTAL RIGHT KNEE ARTHROPLASTY;  Surgeon: Glendia Cordella Hutchinson, MD;  Location: [facility];  Service: Orthopedics;  Laterality: Right;   TOTAL KNEE ARTHROPLASTY Left 07/13/2015   Procedure: LEFT TOTAL KNEE ARTHROPLASTY;  Surgeon: Glendia Cordella Hutchinson, MD;  Location: [facility] OR;  Service: Orthopedics;  Laterality: Left;    ALLERGIES: Allergies  Allergen Reactions   Belsomra  [Suvorexant ] Other (See Comments)  Clemens out of bed while asleep: I'm waking up as I'm falling on the floor; Night terrors   Enalapril Maleate Cough   Latex Hives   Nickel Other (See Comments)    Blisters  Pt has a titanium right and left knee - nickel causes skin irritations that form into blisters and sores   Other Other (See Comments)    Patient has Sarcoidosis and can't tolerate any metals   Augmentin  [Amoxicillin -Pot Clavulanate]     Purpura type rash   Fentanyl      Skin irritation   Morphine  And Codeine Itching and Nausea Only   Doxycycline  Diarrhea and Nausea And Vomiting   Hydrochlorothiazide Other (See Comments)    Low potassium levels    Hydroxychloroquine Sulfate Other (See Comments)    Vision changes    Lyrica [Pregabalin] Other (See Comments)    Made me feel high   Tape Other (See Comments)    Medical tape causes bruising    FAMILY HISTORY:  Family History  Problem Relation Age of Onset   Heart disease Mother    Kidney disease Mother        renal failure   Cancer Father        leukemia   Hypertension Other    Coronary artery disease Other         female 1st degree relative <60   Heart failure Other        congestive   Coronary artery disease Other        Female 1st degree relative <50   Breast cancer Other        1st degree relative <50 S   Breast cancer Sister    Colon cancer Brother 68   Stroke Brother    Heart attack Brother    Cancer Brother 52       colon ca   Esophageal cancer Neg Hx    Stomach cancer Neg Hx    Rectal cancer Neg Hx     SOCIAL HISTORY: Social History   Socioeconomic History   Marital status: Single    Spouse name: Not on file   Number of children: 1   Years of education: Not on file   Highest education level: 12th grade  Occupational History   Occupation: disabled  Tobacco Use   Smoking status: Former    Current packs/day: 0.00    Average packs/day: 0.5 packs/day for 16.0 years (8.0 ttl pk-yrs)    Types: Cigarettes    Start date: 34    Quit date: 2004    Years since quitting: 21.6   Smokeless tobacco: Never   Tobacco comments:    quit smoking in 2004  Vaping Use   Vaping status: Never Used  Substance and Sexual Activity   Alcohol  use: Not Currently    Alcohol /week: 1.0 standard drink of alcohol     Types: 1 Glasses of wine per week   Drug use: No   Sexual activity: Not Currently    Birth control/protection: Post-menopausal  Other Topics Concern   Not on file  Social History Narrative   Single, broke up with partner in 2008.      Lives at home alone/2025   Caffeine: stopped 2009   Social Drivers of Health   Financial Resource Strain: High Risk (08/01/2023)   Overall Financial Resource Strain (CARDIA)    Difficulty of Paying Living Expenses: Hard  Food Insecurity: Food Insecurity Present (08/01/2023)   Hunger Vital Sign    Worried About Running Out of  Food in the Last Year: Often true    Ran Out of Food in the Last Year: Often true  Transportation Needs: Unmet Transportation Needs (08/01/2023)   PRAPARE - Transportation    Lack of Transportation (Medical): Yes    Lack of  Transportation (Non-Medical): Yes  Physical Activity: Unknown (08/01/2023)   Exercise Vital Sign    Days of Exercise per Week: Patient declined    Minutes of Exercise per Session: Not on file  Stress: Stress Concern Present (08/01/2023)   [employer] of Occupational Health - Occupational Stress Questionnaire    Feeling of Stress: Very much  Social Connections: Unknown (08/01/2023)   Social Connection and Isolation Panel    Frequency of Communication with Friends and Family: Patient declined    Frequency of Social Gatherings with Friends and Family: Never    Attends Religious Services: Never    Diplomatic Services operational officer: Patient declined    Attends Engineer, structural: Not on file    Marital Status: Patient declined    MEDICATIONS:  Current Outpatient Medications  Medication Sig Dispense Refill   ACCU-CHEK GUIDE TEST test strip USSE TO CHECK BLOOD SUGAR MORNING, NOON, AND AT BEDTIME- THREE TIMES DAILY 100 strip 3   Accu-Chek Softclix Lancets lancets USE TO CHECK BLOOD SUGAR MORNING, NOON, AND AT BEDTIME- THREE TIMES DAILY 100 each 3   acetaminophen  (TYLENOL ) 325 MG tablet Take 1-2 tablets (325-650 mg total) by mouth every 6 (six) hours as needed for mild pain (pain score 1-3 or temp > 100.5). 30 tablet 1   albuterol  (PROVENTIL ) (2.5 MG/3ML) 0.083% nebulizer solution Take 3 mLs (2.5 mg total) by nebulization every 6 (six) hours as needed for wheezing or shortness of breath. 75 mL 5   albuterol  (VENTOLIN  HFA) 108 (90 Base) MCG/ACT inhaler INHALE TWO puffs into THE lungs EVERY SIX HOURS AS NEEDED wheezind AND For SHORTNESS OF BREATH 6.7 g 3   Blood Glucose Monitoring Suppl DEVI 1 each by Does not apply route in the morning, at noon, and at bedtime. May substitute to any manufacturer covered by patient's insurance. 1 each 0   budesonide -formoterol  (SYMBICORT ) 80-4.5 MCG/ACT inhaler Inhale 2 puffs into the lungs 2 (two) times daily. INHALE TWO puffs into THE lungs  TWICE DAILY 1 each 12   carvedilol  (COREG ) 12.5 MG tablet TAKE ONE TABLET BY MOUTH TWICE DAILY With meals 180 tablet 3   Cholecalciferol  (VITAMIN D -3) 125 MCG (5000 UT) TABS Take 5,000 Units by mouth daily.     cholestyramine  (QUESTRAN ) 4 g packet Take 1 packet (4 g total) by mouth daily. 30 packet 5   clotrimazole -betamethasone  (LOTRISONE ) cream APPLY TO THE AFFECTED AREA(S) TWICE DAILY 90 g 1   colesevelam  (WELCHOL ) 625 MG tablet TAKE TWO TABLETS (1250MG  TOTAL) BY MOUTH TWICE DAILY With meals 120 tablet 1   Cyanocobalamin  (VITAMIN B-12) 1000 MCG SUBL Place 1 tablet (1,000 mcg total) under the tongue daily. 100 tablet 3   dexlansoprazole  (DEXILANT ) 60 MG capsule TAKE ONE CAPSULE BY MOUTH EVERY DAY 90 capsule 1   diphenoxylate -atropine  (LOMOTIL ) 2.5-0.025 MG tablet Take 1-2 tablets by mouth 4 (four) times daily as needed for diarrhea or loose stools. 60 tablet 1   gabapentin  (NEURONTIN ) 300 MG capsule TAKE 1 CAPSULE BY MOUTH EVERY MORNING. MAY TAKE SECOND CAPSULE DURING THE DAY AS NEEDED FOR PAIN 180 capsule 1   glucose blood (ACCU-CHEK GUIDE) test strip Use to check blood sugar once a day. 100 each 12   Glucose  Blood (BLOOD GLUCOSE TEST STRIPS) STRP Use to check blood sugars every day. May substitute to any manufacturer covered by patient's insurance. 100 each 3   HUMIRA PEN 40 MG/0.4ML PNKT Inject 40 mg as directed every Wednesday.      hydrOXYzine  (ATARAX ) 25 MG tablet TAKE ONE TABLET pop EVERY EIGHT HOURS AS NEEDED 60 tablet 3   Lancets Misc. MISC Use to check blood sugars every day. May substitute to any manufacturer covered by patient's insurance. 100 each 3   metFORMIN  (GLUCOPHAGE -XR) 500 MG 24 hr tablet Take 1 tablet (500 mg total) by mouth daily with supper. 90 tablet 3   Multiple Vitamins-Minerals (MULTIVITAMIN WOMEN 50+) TABS Take 1 tablet by mouth daily.     naloxone  (NARCAN ) nasal spray 4 mg/0.1 mL Place 1 spray into the nose once as needed (overdose).     naltrexone (DEPADE) 50 MG  tablet Take 50 mg by mouth daily.     OXYGEN  Inhale 2 L into the lungs continuous.     potassium chloride  (KLOR-CON  M) 10 MEQ tablet TAKE ONE TABLET BY MOUTH EVERY DAY 90 tablet 1   QUEtiapine  (SEROQUEL  XR) 200 MG 24 hr tablet Take 1 tablet (200 mg total) by mouth at bedtime. ** STOP 300mg  er tablet ** 30 tablet 5   QUEtiapine  (SEROQUEL ) 50 MG tablet TAKE ONE TABLET BY MOUTH EVERY MORNING AND TAKE TWO TABLETS BY MOUTH AT BEDTIME 90 tablet 5   sulfamethoxazole -trimethoprim  (BACTRIM  DS) 800-160 MG tablet TAKE ONE TABLET BY MOUTH TWICE DAILY 28 tablet 1   tobramycin  (TOBREX ) 0.3 % ophthalmic solution SMARTSIG:In Eye(s)     tobramycin -dexamethasone  (TOBRADEX ) ophthalmic solution Place 1 drop into both eyes every 6 (six) hours. 5 mL 0   topiramate  (TOPAMAX ) 50 MG tablet TAKE ONE TABLET BY MOUTH TWICE DAILY 180 tablet 1   venlafaxine  XR (EFFEXOR -XR) 75 MG 24 hr capsule TAKE ONE CAPSULE BY MOUTH EVERY DAY WITH breakfast 90 capsule 3   XARELTO  20 MG TABS tablet TAKE ONE TABLET BY MOUTH EVERY DAY 90 tablet 3   No current facility-administered medications for this visit.    PHYSICAL EXAM: Vitals:   08/20/23 1312  BP: 124/72  Pulse: 61  Resp: 20  SpO2: 97%  Weight: 256 lb 6.4 oz (116.3 kg)  Height: 5' 7 (1.702 m)   Body mass index is 40.16 kg/m.  Wt Readings from Last 3 Encounters:  08/20/23 256 lb 6.4 oz (116.3 kg)  08/04/23 260 lb (117.9 kg)  07/25/23 262 lb (118.8 kg)    General: Well developed, well nourished female in no apparent distress.  HEENT: AT/[place], no external lesions.  Eyes: Conjunctiva clear and no icterus. Neck: Neck supple  Lungs: Respirations not labored Neurologic: Alert, oriented, normal speech Extremities / Skin: Dry.  Psychiatric: Does not appear depressed or anxious   Diabetic Foot Exam - Simple   No data filed    LABS Reviewed Lab Results  Component Value Date   HGBA1C 5.6 08/20/2023   HGBA1C 5.4 04/21/2023   HGBA1C 6.0 04/09/2022   No results found  for: FRUCTOSAMINE Lab Results  Component Value Date   CHOL 196 04/21/2023   HDL 86 04/21/2023   LDLCALC 86 04/21/2023   LDLDIRECT 107.0 06/30/2017   TRIG 137 04/21/2023   CHOLHDL 2.3 04/21/2023   Lab Results  Component Value Date   MICRALBCREAT 1.3 06/29/2008   Lab Results  Component Value Date   CREATININE 0.92 05/05/2023   Lab Results  Component Value Date  GFR 66.61 05/05/2023    ASSESSMENT / PLAN  1. Controlled type 2 diabetes mellitus with stage 2 chronic kidney disease, without long-term current use of insulin  (HCC)     Diabetes Mellitus type 2, complicated by no other known complications. - Diabetic status / severity: Controlled.  Lab Results  Component Value Date   HGBA1C 5.6 08/20/2023    - Hemoglobin A1c goal : <6.5%  Patient has controlled diabetes mellitus on current dose of metformin .  She had uncontrolled type 2 diabetes mellitus, she reports was steroid-induced for the treatment of sarcoidosis in the timeframe of 2013.  Patient complains of stomach upset and diarrhea.  - Medications: No change.  I) hold metformin  extended release 500 mg 1 tablet for now as a trial to see if it helps with her stomach upset and diarrhea.  Patient is asked to monitor blood sugar every day at least in the morning fasting, goal blood sugar should be in the low 100 range.  If blood sugar is more than 130 asked to call our clinic.  Will plan for alternative antidiabetic medication.  - Home glucose testing: In the morning fasting daily. - Discussed/ Gave Hypoglycemia treatment plan.  # Consult : not required at this time.   # Annual urine for microalbuminuria/ creatinine ratio, no microalbuminuria currently.  Will check in future visit. Last  Lab Results  Component Value Date   MICRALBCREAT 1.3 06/29/2008    # Foot check nightly.  # Annual dilated diabetic eye exams.   - Diet: Make healthy diabetic food choices - Life style / activity / exercise:  Discussed.  2. Blood pressure  -  BP Readings from Last 1 Encounters:  08/20/23 124/72    - Control is in target.  - No change in current plans.  3. Lipid status / Hyperlipidemia - Last  Lab Results  Component Value Date   LDLCALC 86 04/21/2023   -Currently not on a statin. On colesevelam . Managed by PCP.  Discussed about indication of a statin therapy with having history of type 2 diabetes mellitus.  Consider statin therapy.  Diagnoses and all orders for this visit:  Controlled type 2 diabetes mellitus with stage 2 chronic kidney disease, without long-term current use of insulin  (HCC) -     POCT glycosylated hemoglobin (Hb A1C)   DISPOSITION Follow up in clinic in 3 months suggested.   All questions answered and patient verbalized understanding of the plan.  [place] Christyan Reger, MD [facility] Endocrinology [facility] [address], [address] [place], [place] [address] Phone # [phone]  At least part of this note was generated using voice recognition software. Inadvertent word errors may have occurred, which were not recognized during the proofreading process.

## 2023-08-22 NOTE — Unmapped (Signed)
 [name] Refill Coordination Note    Specialty Medication(s) to be Shipped:   Inflammatory Disorders: Humira     Other medication(s) to be shipped: No additional medications requested for fill at this time     Carolyn Newton, DOB: 1960-12-14  Phone: [phone] (home)       All above HIPAA information was verified with patient.     Was a Nurse, learning disability used for this call? No    Completed refill call assessment today to schedule patient's medication shipment from the [facility]  [phone]).  All relevant notes have been reviewed.     Specialty medication(s) and dose(s) confirmed: Regimen is correct and unchanged.   Changes to medications: Carolyn Newton reports stopping the following medications: Metformin  Changes to insurance: No  New side effects reported not previously addressed with a pharmacist or physician: None reported  Questions for the pharmacist: No    Confirmed patient received a Conservation officer, historic buildings and a Surveyor, mining with first shipment. The patient will receive a drug information handout for each medication shipped and additional FDA Medication Guides as required.       DISEASE/MEDICATION-SPECIFIC INFORMATION        For patients on injectable medications: Patient currently has 2 doses left.  Next injection is scheduled for 08/27/2023.    SPECIALTY MEDICATION ADHERENCE     Medication Adherence    Patient reported X missed doses in the last month: 0  Specialty Medication: HUMIRA  PEN CITRATE FREE 40 MG/0.4 ML              Were doses missed due to medication being on hold? No     HUMIRA (CF) PEN 40 mg/0.4 mL injection (adalimumab ): 2 doses of medicine on hand       REFERRAL TO PHARMACIST     Referral to the pharmacist: Not needed      [facility]     Shipping address confirmed in Epic.     Cost and Payment: Patient has a $0 copay, payment information is not required.    Delivery Scheduled: Yes, Expected medication delivery date: 09/05/2023.     Medication will be delivered via UPS to the prescription address in Epic WAM.    Carolyn Newton   [facility]  Specialty Technician

## 2023-08-26 ENCOUNTER — Ambulatory Visit (HOSPITAL_BASED_OUTPATIENT_CLINIC_OR_DEPARTMENT_OTHER)

## 2023-08-29 ENCOUNTER — Ambulatory Visit (HOSPITAL_BASED_OUTPATIENT_CLINIC_OR_DEPARTMENT_OTHER)
Admission: RE | Admit: 2023-08-29 | Discharge: 2023-08-29 | Disposition: A | Discharge status: Home / Self Care | Source: Ambulatory Visit | Attending: Adult Health | Admitting: Adult Health

## 2023-08-29 DIAGNOSIS — I700 Atherosclerosis of aorta: Secondary | ICD-10-CM | POA: Diagnosis not present

## 2023-08-29 DIAGNOSIS — R911 Solitary pulmonary nodule: Secondary | ICD-10-CM | POA: Diagnosis not present

## 2023-08-29 DIAGNOSIS — R918 Other nonspecific abnormal finding of lung field: Secondary | ICD-10-CM | POA: Diagnosis not present

## 2023-09-04 MED FILL — HUMIRA PEN CITRATE FREE 40 MG/0.4 ML: SUBCUTANEOUS | 28 days supply | Qty: 4 | Fill #11

## 2023-09-05 ENCOUNTER — Telehealth: Payer: Self-pay

## 2023-09-05 MED ORDER — SULFAMETHOXAZOLE-TRIMETHOPRIM 800-160 MG PO TABS: 1.0000 | ORAL_TABLET | Freq: Two times a day (BID) | ORAL | 1 refills | Status: AC

## 2023-09-05 MED ORDER — TOBRAMYCIN-DEXAMETHASONE 0.3-0.1 % OP SUSP: 1.0000 [drp] | Freq: Four times a day (QID) | OPHTHALMIC | 0 refills | Status: AC

## 2023-09-05 NOTE — Telephone Encounter (Signed)
 Copied from CRM [id number]. Topic: Clinical - Medication Question >> Sep 05, 2023 11:44 AM Burnard DEL wrote: Reason for CRM: Patient called in and would like to know if the antibiotic that she was prescribed before for her face broken out and ear? She could not remember the name of the medication,but it was an antibiotic.   [facility] - [place], [place] - [id number]  Phone: [phone] Fax: [phone]

## 2023-09-05 NOTE — Telephone Encounter (Signed)
 Medication has been sent in

## 2023-09-05 NOTE — Telephone Encounter (Signed)
 Pts rx request has been sent to the pharmacy.

## 2023-09-07 DIAGNOSIS — J449 Chronic obstructive pulmonary disease, unspecified: Secondary | ICD-10-CM | POA: Diagnosis not present

## 2023-09-16 ENCOUNTER — Other Ambulatory Visit: Payer: Self-pay | Admitting: Internal Medicine

## 2023-09-18 ENCOUNTER — Ambulatory Visit: Payer: Self-pay | Admitting: Adult Health

## 2023-09-22 ENCOUNTER — Encounter: Admit: 2023-09-22 | Discharge: 2023-09-22 | Payer: Medicare (Managed Care)

## 2023-09-22 DIAGNOSIS — L209 Atopic dermatitis, unspecified: Secondary | ICD-10-CM | POA: Diagnosis not present

## 2023-09-22 DIAGNOSIS — D869 Sarcoidosis, unspecified: Secondary | ICD-10-CM | POA: Diagnosis not present

## 2023-09-22 DIAGNOSIS — L439 Lichen planus, unspecified: Secondary | ICD-10-CM | POA: Diagnosis not present

## 2023-09-22 DIAGNOSIS — Z79899 Other long term (current) drug therapy: Secondary | ICD-10-CM | POA: Diagnosis not present

## 2023-09-22 DIAGNOSIS — L089 Local infection of the skin and subcutaneous tissue, unspecified: Principal | ICD-10-CM

## 2023-09-22 MED ORDER — HUMIRA PEN CITRATE FREE 40 MG/0.4 ML
SUBCUTANEOUS | 11 refills | 28.00000 days | Status: CP
Start: 2023-09-22 — End: ?
  Filled 2023-10-09: qty 4, 28d supply, fill #0

## 2023-09-22 MED ORDER — CLOTRIMAZOLE-BETAMETHASONE 1 %-0.05 % TOPICAL CREAM
Freq: Two times a day (BID) | TOPICAL | 5 refills | 0.00000 days | Status: CP
Start: 2023-09-22 — End: 2024-09-21

## 2023-09-22 NOTE — Unmapped (Addendum)
 Dermatology Note    Assessment and Plan:      Lichen planus, chronic and flaring  - Continue clobetasol  0.05% ointment BID x 2 weeks prn  - We reviewed proper use and side effects of topical steroids including striae and cutaneous atrophy.    Intertrigo: lotrisone  cream twice daily for 2 weeks as needed     Sarcoidosis, cutaneous and pulmonary, chronic and stable   - Condition has been well-controlled with Humira , which was started in 06/2016 and increased to 40 mg q7d in 09/2016. For more detailed disease course, refer to PMH.   - Diagnosis, treatment options, prognosis, risk/ benefit, and side effects of treatment were discussed with the patient.   - Continue Humira  40 mg weekly.  - REFER to ophthalmology per her request    Concern for impetigo on the ear and around the nose:  - aerobic swab from L ear today    Atopic dermatitis:  - Diagnosis, treatment options, prognosis, risk/ benefit, and side effects of treatment were discussed with the patient.   -prior failure of halobetasol , clobetasol , triamcinolone , betamethasone , and tacrolimus  ointment (protopic )  - Start halobetasol  0.05% ointment; Apply twice daily to the flaring areas of the body until skin is clear or for no longer than two weeks. Addendum: not working well so will change to Opzelura  once daily and Dermasmoothe for scalp     High risk medication use (Humira )   - Discussed the risks, benefits, alternatives and complications of the biologic medications such as reactivation of tuberculosis or fungal infections, liver damage, lowering of blood counts, injection site reactions, allergic reaction,  development of infections..  The patient expressed understanding and wished to proceed.  - Last negative quant gold on 09/27/2022.  Will recheck at next visit      RTC: 4 months  _________________________________________________________________      Chief Complaint     Chief Complaint   Patient presents with    Rash     Under stomach very itchy possible yeast the prescription creams are not working pt also states her ear is breaking out using clobetasol        HPI     Carolyn Newton is a 63 y.o. female who presents as a returning patient (last seen  05/19/2023) to Dermatology for follow up of sarcoidosis. Has continued humira  40mg  weekly for sarcoidosis with seemingly good continued control.  Most bothersome recently have been rashes in the inframammary and infra abdominal area.  She has tried using a few different prescription creams although she is not sure exactly which ones they were.  They do not seem to help much so far.  She does note that she had crusting around the ears and nose and did some antibiotics a while back that seem to be helpful, but then symptoms have returned in the last few weeks.    Today, patient reports a rash on her feet, back, bilateral lower legs and upper extremities. Patient states she is doing humira  with no improvement to her skin. Patient states she has not used protopic  before.       The patient denies any other new or changing lesions or areas of concern.     Pertinent Past Medical History     Sarcoidosis        Previous disease course:  Long history of pulmonary and cutaneous sarcoidosis since 2003-2004.  Many years on and off of prednisone  with temporary improvement, but overall thinks she is worse over time.  Has had a  large amount of weight gain in the last year since restarting prednisone  in April 2016.   Was on methotrexate given worsening cutaneous involvement of the scalp.  Dose was 10mg  taken 2 doses 12 hours apart in winter 2017-18.  Did that for 3 months, but stopped because she thought it wasn't helping.  Had been on hydroxychloroquine many years ago, but it was not very helpful.  Has been on doxycycline many times over the years without much improvement.           Past Medical History, Family History, Social History, Medication List, Allergies, and Problem List were reviewed in the rooming section of Epic.     ROS: Other than symptoms mentioned in the HPI, no fevers, chills, or other skin complaints    Physical Examination     Gen: Well-appearing patient, appropriate, interactive, in no acute distress  SKIN: Examination of the ears, neck, chest, upper chest, abdomen, back, upper back, bilateral upper extremities, bilateral lower extremities, genitalia, and groin was performed  - Yellow crusting and excoriated erythematous plaques of the bilateral ears and around the nose.  - On the infra abdominal fold is a deeply erythematous and eroded plaque about 4 to 6 cm in diameter and a few smaller plaques in similar location.  There is also erythema and slight erosion following the inframammary creases.    -sites not commented on demonstrate normal findings.    (Approved Template 09/27/2019)

## 2023-09-22 NOTE — Unmapped (Signed)
 Meet your team:     Your intake nurse is: Britt Boozer    Please remember to fill out the survey you will receive after your visit. Your comments help [place] continue to improve our care.      Thanks in advance!      [facility] Dermatology Clinical Staff

## 2023-09-30 DIAGNOSIS — D869 Sarcoidosis, unspecified: Secondary | ICD-10-CM | POA: Diagnosis not present

## 2023-09-30 DIAGNOSIS — H6012 Cellulitis of left external ear: Secondary | ICD-10-CM | POA: Diagnosis not present

## 2023-09-30 DIAGNOSIS — J3489 Other specified disorders of nose and nasal sinuses: Secondary | ICD-10-CM | POA: Diagnosis not present

## 2023-09-30 DIAGNOSIS — J310 Chronic rhinitis: Secondary | ICD-10-CM | POA: Diagnosis not present

## 2023-10-01 MED ORDER — CEFADROXIL 500 MG CAPSULE
ORAL_CAPSULE | Freq: Every day | ORAL | 0 refills | 10.00000 days | Status: CP
Start: 2023-10-01 — End: ?

## 2023-10-01 NOTE — Unmapped (Signed)
 Addended by: PAULITA BRUCKNER on: 10/01/2023 11:16 PM     Modules accepted: Orders

## 2023-10-02 ENCOUNTER — Other Ambulatory Visit: Payer: Self-pay | Admitting: Internal Medicine

## 2023-10-02 ENCOUNTER — Other Ambulatory Visit: Payer: Self-pay | Admitting: Physician Assistant

## 2023-10-02 DIAGNOSIS — K529 Noninfective gastroenteritis and colitis, unspecified: Secondary | ICD-10-CM

## 2023-10-02 NOTE — Telephone Encounter (Signed)
 Please review refill request

## 2023-10-03 NOTE — Unmapped (Signed)
 [facility] Refill Coordination Note    Specialty Medication(s) to be Shipped:   Inflammatory Disorders: Humira     Other medication(s) to be shipped: No additional medications requested for fill at this time    Specialty Medications not needed at this time: N/A     Torii Royse, DOB: [birth year]  Phone: [phone] (home)       All above HIPAA information was verified with patient.     Was a Nurse, learning disability used for this call? No    Completed refill call assessment today to schedule patient's medication shipment from the [facility]  [phone]).  All relevant notes have been reviewed.     Specialty medication(s) and dose(s) confirmed: Regimen is correct and unchanged.   Changes to medications: Tatayana reports no changes at this time.  Changes to insurance: No  New side effects reported not previously addressed with a pharmacist or physician: None reported  Questions for the pharmacist: No    Confirmed patient received a Conservation officer, historic buildings and a Surveyor, mining with first shipment. The patient will receive a drug information handout for each medication shipped and additional FDA Medication Guides as required.       DISEASE/MEDICATION-SPECIFIC INFORMATION        For patients on injectable medications: Next injection is scheduled for 10/08/2023.    SPECIALTY MEDICATION ADHERENCE     Medication Adherence    Patient reported X missed doses in the last month: 0  Specialty Medication: HUMIRA (CF) PEN 40 mg/0.4 mL injection (adalimumab )  Patient is on additional specialty medications: No              Were doses missed due to medication being on hold? No     adalimumab : HUMIRA (CF) PEN 40 mg/0.4 mL injection: 1 doses of medicine on hand       REFERRAL TO PHARMACIST     Referral to the pharmacist: Not needed      SHIPPING     Shipping address confirmed in Epic.     Cost and Payment: Patient has a $0 copay, payment information is not required.    Delivery Scheduled: Yes, Expected medication delivery date: 10/10/2023.     Medication will be delivered via UPS to the prescription address in Epic WAM.    Lucie CHRISTELLA Forts   [facility]  Specialty Technician

## 2023-10-09 DIAGNOSIS — L209 Atopic dermatitis, unspecified: Principal | ICD-10-CM

## 2023-10-09 MED ORDER — OPZELURA 1.5 % TOPICAL CREAM
TOPICAL | 11 refills | 0.00000 days | Status: CP
Start: 2023-10-09 — End: ?

## 2023-10-09 MED ORDER — FLUOCINOLONE 0.01 % TOPICAL BODY OIL
Freq: Every day | TOPICAL | 11 refills | 0.00000 days | Status: CP
Start: 2023-10-09 — End: 2024-10-08

## 2023-10-15 NOTE — Unmapped (Signed)
 PA initiated for Opzelura  1.5% cream via CMM.  Key: [id number]

## 2023-10-16 DIAGNOSIS — L209 Atopic dermatitis, unspecified: Principal | ICD-10-CM

## 2023-10-16 MED ORDER — TACROLIMUS 0.1 % TOPICAL OINTMENT
TOPICAL | 3 refills | 0.00000 days | Status: CP
Start: 2023-10-16 — End: ?

## 2023-10-16 NOTE — Unmapped (Signed)
 Prescription refill request for tacrolimus , Last office visit was 09/22/23    Please Advise

## 2023-10-28 NOTE — Unmapped (Signed)
 [facility] Refill Coordination Note    Specialty Medication(s) to be Shipped:   Inflammatory Disorders: Humira     Other medication(s) to be shipped: No additional medications requested for fill at this time    Specialty Medications not needed at this time: N/A     Carolyn Newton, DOB: [birth year]  Phone: [phone] (home)       All above HIPAA information was verified with patient.     Was a Nurse, learning disability used for this call? No    Completed refill call assessment today to schedule patient's medication shipment from the [facility]  [phone]).  All relevant notes have been reviewed.     Specialty medication(s) and dose(s) confirmed: Regimen is correct and unchanged.   Changes to medications: Zacaria reports no changes at this time.  Changes to insurance: No  New side effects reported not previously addressed with a pharmacist or physician: None reported  Questions for the pharmacist: No    Confirmed patient received a Conservation officer, historic buildings and a Surveyor, mining with first shipment. The patient will receive a drug information handout for each medication shipped and additional FDA Medication Guides as required.       DISEASE/MEDICATION-SPECIFIC INFORMATION        For patients on injectable medications: Next injection is scheduled for 10/29/2023.    SPECIALTY MEDICATION ADHERENCE     Medication Adherence    Patient reported X missed doses in the last month: 0  Specialty Medication: HUMIRA (CF) PEN 40 mg/0.4 mL injection (adalimumab )              Were doses missed due to medication being on hold? No    HUMIRA (CF) PEN 40 mg/0.4 mL injection (adalimumab )  : 3 doses of medicine on hand         REFERRAL TO PHARMACIST     Referral to the pharmacist: Not needed      SHIPPING     Shipping address confirmed in Epic.     Cost and Payment: Patient has a $0 copay, payment information is not required.    Delivery Scheduled: Yes, Expected medication delivery date: 11/06/2023.     Medication will be delivered via UPS with signature to the prescription address in Epic WAM.    Chiquita Gavel   [facility]  Specialty Technician

## 2023-11-04 ENCOUNTER — Ambulatory Visit: Admitting: Internal Medicine

## 2023-11-04 ENCOUNTER — Encounter: Payer: Self-pay | Admitting: Internal Medicine

## 2023-11-04 VITALS — BP 116/74 | HR 70 | Temp 98.6°F | Ht 67.0 in | Wt 251.4 lb

## 2023-11-04 DIAGNOSIS — R21 Rash and other nonspecific skin eruption: Secondary | ICD-10-CM

## 2023-11-04 DIAGNOSIS — D869 Sarcoidosis, unspecified: Secondary | ICD-10-CM

## 2023-11-04 DIAGNOSIS — J310 Chronic rhinitis: Secondary | ICD-10-CM | POA: Diagnosis not present

## 2023-11-04 DIAGNOSIS — R5381 Other malaise: Secondary | ICD-10-CM

## 2023-11-04 DIAGNOSIS — L439 Lichen planus, unspecified: Secondary | ICD-10-CM

## 2023-11-04 DIAGNOSIS — Z23 Encounter for immunization: Secondary | ICD-10-CM

## 2023-11-04 DIAGNOSIS — E538 Deficiency of other specified B group vitamins: Secondary | ICD-10-CM

## 2023-11-04 DIAGNOSIS — H04302 Unspecified dacryocystitis of left lacrimal passage: Secondary | ICD-10-CM

## 2023-11-04 MED ORDER — CIPROFLOXACIN HCL 500 MG PO TABS: 500.0000 mg | ORAL_TABLET | Freq: Two times a day (BID) | ORAL | 0 refills | Status: AC

## 2023-11-04 MED ORDER — CARVEDILOL 12.5 MG PO TABS: 12.5000 mg | ORAL_TABLET | Freq: Two times a day (BID) | ORAL | 3 refills | Status: AC

## 2023-11-04 MED ORDER — HYDROCODONE BIT-HOMATROP MBR 5-1.5 MG/5ML PO SOLN: 5.0000 mL | Freq: Three times a day (TID) | ORAL | 0 refills | Status: DC | PRN

## 2023-11-04 NOTE — Assessment & Plan Note (Signed)
 Chronic Using a rolling walker

## 2023-11-04 NOTE — Assessment & Plan Note (Addendum)
 Pt saw Dr Carlie - got Cipro (it helped). Dr Carlie suggested to see a sarcoid specialist at the [facility]. . Seeing Dr Paulita at [facility] Dermatology for Lichen planus.   Worse again Seeing Dr Paulita (Derm) - on Humira Pt would like to be sent to [facility] - Will ref to see Dr Hildegard (Sarcoidosis Clinic) Rx: cipro Cough syr Rx

## 2023-11-04 NOTE — Assessment & Plan Note (Signed)
Cleared

## 2023-11-04 NOTE — Assessment & Plan Note (Signed)
  Seeing Dr Paulita (Derm) - on Humira; creams

## 2023-11-04 NOTE — Assessment & Plan Note (Signed)
 On B12

## 2023-11-04 NOTE — Assessment & Plan Note (Addendum)
 Worse Seeing Dr Alva (Pulm) Seeing Dr Paulita (Derm) - on Humira Pt would like to be sent to [facility] - Will ref to see Dr Hildegard (Sarcoidosis Clinic)

## 2023-11-04 NOTE — Assessment & Plan Note (Signed)
 Nose, ears Try non-silicone O2 tubing

## 2023-11-04 NOTE — Progress Notes (Signed)
 Subjective:  Patient ID: [name], female    DOB: 04-23-1960  Age: 63 y.o. MRN: [id number]  CC: Rash (Rash on ears and nose for several months. Also rash present on the abdomen. Notes some dysphagia and fatigue)   HPI [name] presents for ozena, sarcoidosis, chronic rash. Pt saw Dr Carlie - got Cipro (it helped). Dr Carlie suggested to see a sarcoid specialist at the [facility]. . Seeing Dr Paulita at [facility] Dermatology for Lichen planus.  C/o cough   Outpatient Medications Prior to Visit  Medication Sig Dispense Refill   ACCU-CHEK GUIDE TEST test strip USSE TO CHECK BLOOD SUGAR MORNING, NOON, AND AT BEDTIME- THREE TIMES DAILY 100 strip 3   Accu-Chek Softclix Lancets lancets USE TO CHECK BLOOD SUGAR MORNING, NOON, AND AT BEDTIME- THREE TIMES DAILY 100 each 3   acetaminophen  (TYLENOL ) 325 MG tablet Take 1-2 tablets (325-650 mg total) by mouth every 6 (six) hours as needed for mild pain (pain score 1-3 or temp > 100.5). 30 tablet 1   albuterol  (PROVENTIL ) (2.5 MG/3ML) 0.083% nebulizer solution Take 3 mLs (2.5 mg total) by nebulization every 6 (six) hours as needed for wheezing or shortness of breath. 75 mL 5   albuterol  (VENTOLIN  HFA) 108 (90 Base) MCG/ACT inhaler INHALE TWO puffs into THE lungs EVERY SIX HOURS AS NEEDED wheezind AND For SHORTNESS OF BREATH 6.7 g 3   Blood Glucose Monitoring Suppl DEVI 1 each by Does not apply route in the morning, at noon, and at bedtime. May substitute to any manufacturer covered by patient's insurance. 1 each 0   Cholecalciferol  (VITAMIN D -3) 125 MCG (5000 UT) TABS Take 5,000 Units by mouth daily.     cholestyramine  (QUESTRAN ) 4 g packet Take 1 packet (4 g total) by mouth daily. 30 packet 5   colesevelam  (WELCHOL ) 625 MG tablet TAKE TWO TABLETS (1250MG  TOTAL) BY MOUTH TWICE DAILY With meals 120 tablet 1   Cyanocobalamin  (VITAMIN B-12) 1000 MCG SUBL Place 1 tablet (1,000 mcg total) under the tongue daily. 100 tablet 3    dexlansoprazole  (DEXILANT ) 60 MG capsule TAKE ONE CAPSULE BY MOUTH EVERY DAY 90 capsule 1   diphenoxylate -atropine  (LOMOTIL ) 2.5-0.025 MG tablet Take 1-2 tablets by mouth 4 (four) times daily as needed for diarrhea or loose stools. 60 tablet 1   glucose blood (ACCU-CHEK GUIDE) test strip Use to check blood sugar once a day. 100 each 12   Glucose Blood (BLOOD GLUCOSE TEST STRIPS) STRP Use to check blood sugars every day. May substitute to any manufacturer covered by patient's insurance. 100 each 3   HUMIRA PEN 40 MG/0.4ML PNKT Inject 40 mg as directed every Wednesday.      hydrOXYzine  (ATARAX ) 25 MG tablet TAKE ONE TABLET BY MOUTH EVERY EIGHT HOURS AS NEEDED 60 tablet 3   Lancets Misc. MISC Use to check blood sugars every day. May substitute to any manufacturer covered by patient's insurance. 100 each 3   metFORMIN  (GLUCOPHAGE -XR) 500 MG 24 hr tablet Take 1 tablet (500 mg total) by mouth daily with supper. 90 tablet 3   Multiple Vitamins-Minerals (MULTIVITAMIN WOMEN 50+) TABS Take 1 tablet by mouth daily.     naloxone  (NARCAN ) nasal spray 4 mg/0.1 mL Place 1 spray into the nose once as needed (overdose).     naltrexone (DEPADE) 50 MG tablet Take 50 mg by mouth daily.     OXYGEN  Inhale 2 L into the lungs continuous.     potassium chloride  (KLOR-CON  M) 10  MEQ tablet TAKE ONE TABLET BY MOUTH EVERY DAY 90 tablet 1   QUEtiapine  (SEROQUEL  XR) 200 MG 24 hr tablet TAKE ONE TABLET BY MOUTH AT BEDTIME .** STOP 300mg ** 30 tablet 5   QUEtiapine  (SEROQUEL ) 50 MG tablet TAKE ONE TABLET BY MOUTH EVERY MORNING, TAKE TWO TABLETS BY MOUTH AT BEDTIME 90 tablet 5   sulfamethoxazole -trimethoprim  (BACTRIM  DS) 800-160 MG tablet Take 1 tablet by mouth 2 (two) times daily. 28 tablet 1   tobramycin  (TOBREX ) 0.3 % ophthalmic solution SMARTSIG:In Eye(s)     tobramycin -dexamethasone  (TOBRADEX ) ophthalmic solution Place 1 drop into both eyes every 6 (six) hours. 5 mL 0   topiramate  (TOPAMAX ) 50 MG tablet TAKE ONE TABLET BY MOUTH  TWICE DAILY 180 tablet 1   venlafaxine  XR (EFFEXOR -XR) 75 MG 24 hr capsule TAKE ONE CAPSULE BY MOUTH EVERY DAY WITH breakfast 90 capsule 3   XARELTO  20 MG TABS tablet TAKE ONE TABLET BY MOUTH EVERY DAY 90 tablet 3   budesonide -formoterol  (SYMBICORT ) 80-4.5 MCG/ACT inhaler Inhale 2 puffs into the lungs 2 (two) times daily. INHALE TWO puffs into THE lungs TWICE DAILY 1 each 12   carvedilol  (COREG ) 12.5 MG tablet TAKE ONE TABLET BY MOUTH TWICE DAILY With meals 180 tablet 3   clotrimazole -betamethasone  (LOTRISONE ) cream APPLY TO THE AFFECTED AREA(S) TWICE DAILY (Patient not taking: Reported on 11/04/2023) 90 g 1   gabapentin  (NEURONTIN ) 300 MG capsule TAKE 1 CAPSULE BY MOUTH EVERY MORNING. MAY TAKE SECOND CAPSULE DURING THE DAY AS NEEDED FOR PAIN (Patient not taking: Reported on 11/04/2023) 180 capsule 1   No facility-administered medications prior to visit.    ROS: Review of Systems  Constitutional:  Positive for fatigue and unexpected weight change. Negative for activity change, appetite change and chills.  HENT:  Positive for congestion, dental problem, drooling, facial swelling, hearing loss, mouth sores, nosebleeds, postnasal drip, rhinorrhea, sinus pressure, sinus pain, sore throat and voice change.   Eyes:  Negative for visual disturbance.  Respiratory:  Positive for cough, shortness of breath and wheezing. Negative for chest tightness.   Gastrointestinal:  Negative for abdominal pain and nausea.  Genitourinary:  Positive for frequency. Negative for difficulty urinating and vaginal pain.  Musculoskeletal:  Positive for arthralgias, back pain and gait problem.  Skin:  Positive for rash and wound. Negative for pallor.  Neurological:  Positive for weakness and headaches. Negative for dizziness, tremors and numbness.  Hematological:  Bruises/bleeds easily.  Psychiatric/Behavioral:  Positive for dysphoric mood. Negative for confusion, sleep disturbance and suicidal ideas. The patient is  nervous/anxious.     Objective:  BP 116/74   Pulse 70   Temp 98.6 F (37 C)   Ht 5' 7 (1.702 m)   Wt 251 lb 6.4 oz (114 kg)   LMP 05/19/2003   SpO2 93%   BMI 39.37 kg/m   BP Readings from Last 3 Encounters:  11/04/23 116/74  08/20/23 124/72  08/04/23 128/76    Wt Readings from Last 3 Encounters:  11/04/23 251 lb 6.4 oz (114 kg)  08/20/23 256 lb 6.4 oz (116.3 kg)  08/04/23 260 lb (117.9 kg)    Physical Exam Constitutional:      General: She is not in acute distress.    Appearance: She is well-developed. She is obese. She is ill-appearing. She is not toxic-appearing.  HENT:     Head: Normocephalic.     Right Ear: External ear normal.     Left Ear: External ear normal.     Nose: Congestion  and rhinorrhea present.     Mouth/Throat:     Pharynx: Oropharyngeal exudate and posterior oropharyngeal erythema present.  Eyes:     General:        Right eye: No discharge.        Left eye: No discharge.     Conjunctiva/sclera: Conjunctivae normal.     Pupils: Pupils are equal, round, and reactive to light.  Neck:     Thyroid : No thyromegaly.     Vascular: No JVD.     Trachea: No tracheal deviation.  Cardiovascular:     Rate and Rhythm: Normal rate and regular rhythm.     Heart sounds: Normal heart sounds.  Pulmonary:     Effort: No respiratory distress.     Breath sounds: No stridor. No wheezing.  Abdominal:     General: Bowel sounds are normal. There is no distension.     Palpations: Abdomen is soft. There is no mass.     Tenderness: There is no abdominal tenderness. There is no guarding or rebound.  Musculoskeletal:        General: Tenderness present.     Cervical back: Normal range of motion and neck supple. No rigidity.     Right lower leg: No edema.     Left lower leg: No edema.  Lymphadenopathy:     Cervical: No cervical adenopathy.  Skin:    Findings: Erythema, lesion and rash present.  Neurological:     Mental Status: Mental status is at baseline.      Cranial Nerves: No cranial nerve deficit.     Motor: No abnormal muscle tone.     Coordination: Coordination normal.     Gait: Gait abnormal.     Deep Tendon Reflexes: Reflexes normal.  Psychiatric:        Behavior: Behavior normal.        Thought Content: Thought content normal.        Judgment: Judgment normal.    Using a walker Obese, appears chronically ill Swollen nares, eyelids [name] is upset with her medical condition  Lab Results  Component Value Date   WBC 8.5 05/05/2023   HGB 13.1 05/05/2023   HCT 40.0 05/05/2023   PLT 225.0 05/05/2023   GLUCOSE 90 05/05/2023   CHOL 196 04/21/2023   TRIG 137 04/21/2023   HDL 86 04/21/2023   LDLDIRECT 107.0 06/30/2017   LDLCALC 86 04/21/2023   ALT 24 05/05/2023   AST 20 05/05/2023   NA 140 05/05/2023   K 4.1 05/05/2023   CL 104 05/05/2023   CREATININE 0.92 05/05/2023   BUN 28 (H) 05/05/2023   CO2 29 05/05/2023   TSH 1.10 05/05/2023   INR 1.00 02/11/2018   HGBA1C 5.6 08/20/2023   MICROALBUR 0.4 06/29/2008    CT Chest Wo Contrast Result Date: 09/11/2023 CLINICAL DATA:  Follow-up left lower lobe consolidation EXAM: CT CHEST WITHOUT CONTRAST TECHNIQUE: Multidetector CT imaging of the chest was performed following the standard protocol without IV contrast. RADIATION DOSE REDUCTION: This exam was performed according to the departmental dose-optimization program which includes automated exposure control, adjustment of the mA and/or kV according to patient size and/or use of iterative reconstruction technique. COMPARISON:  08/19/2022 FINDINGS: Cardiovascular: Somewhat limited due to lack of IV contrast. Mild atherosclerotic calcifications are seen. No aneurysmal dilatation is noted. The heart is not enlarged in size. Mild coronary calcifications are noted. Mediastinum/Nodes: Thoracic inlet is within normal limits. Multiple calcified hilar and mediastinal lymph nodes are seen similar to that  noted on the prior exam consistent with prior  sarcoidosis. The esophagus as visualized is within normal limits. Lungs/Pleura: Lungs are well aerated bilaterally. Biapical pleural and parenchymal scarring is seen. No focal confluent infiltrate or sizable parenchymal nodule is noted. Mild fibrotic changes are seen consistent with the given clinical history of sarcoidosis. The area of parenchymal opacity seen in the left lower lobe anteriorly has resolved. No effusion is noted. Upper Abdomen: Gallbladder has been surgically removed. The remainder of the upper abdomen appears within normal limits. Musculoskeletal: Degenerative changes of the thoracic spine are noted. IMPRESSION: Changes consistent with the given clinical history of sarcoidosis. Left lower lobe superior segment opacity seen on the prior exam has resolved consistent with previous postinflammatory change. No new focal abnormality is noted. Aortic Atherosclerosis (ICD10-I70.0). Electronically Signed   By: Oneil Devonshire M.D.   On: 09/11/2023 22:56    Assessment & Plan:   Problem List Items Addressed This Visit     B12 deficiency   On B12      Dacrocystitis, left   Cleared      Lichen planus    Seeing Dr Paulita (Derm) - on Humira; creams       Ozena - Primary    Pt saw Dr Carlie - got Cipro (it helped). Dr Carlie suggested to see a sarcoid specialist at the [facility]. . Seeing Dr Paulita at [facility] Dermatology for Lichen planus.   Worse again Seeing Dr Paulita (Derm) - on Humira Pt would like to be sent to [facility] - Will ref to see Dr Hildegard (Sarcoidosis Clinic) Rx: cipro Cough syr Rx      Relevant Orders   Ambulatory referral to Pulmonology   Physical deconditioning   Chronic Using a rolling walker      Rash   Nose, ears Try non-silicone O2 tubing      Sarcoidosis   Worse Seeing Dr Jude (Pulm) Seeing Dr Paulita (Derm) - on Humira Pt would like to be sent to [facility] - Will ref to see Dr Hildegard (Sarcoidosis Clinic)      Relevant Orders   Ambulatory referral to Pulmonology    Other Visit Diagnoses       Immunization due       Relevant Orders   Flu vaccine trivalent PF, 6mos and older(Flulaval,Afluria,Fluarix,Fluzone) (Completed)         Meds ordered this encounter  Medications   carvedilol  (COREG ) 12.5 MG tablet    Sig: Take 1 tablet (12.5 mg total) by mouth 2 (two) times daily with a meal.    Dispense:  180 tablet    Refill:  3    This prescription was filled on 10/15/2022. Any refills authorized will be placed on file.   DISCONTD: HYDROcodone  bit-homatropine (HYCODAN) 5-1.5 MG/5ML syrup    Sig: Take 5 mLs by mouth every 8 (eight) hours as needed.    Dispense:  240 mL    Refill:  0   ciprofloxacin (CIPRO) 500 MG tablet    Sig: Take 1 tablet (500 mg total) by mouth 2 (two) times daily for 10 days.    Dispense:  28 tablet    Refill:  0      Follow-up: No follow-ups on file.  Marolyn Noel, MD

## 2023-11-05 ENCOUNTER — Other Ambulatory Visit: Payer: Self-pay | Admitting: Adult Health

## 2023-11-05 ENCOUNTER — Encounter: Payer: Self-pay | Admitting: Internal Medicine

## 2023-11-05 ENCOUNTER — Other Ambulatory Visit: Payer: Self-pay | Admitting: Internal Medicine

## 2023-11-05 MED FILL — HUMIRA PEN CITRATE FREE 40 MG/0.4 ML: SUBCUTANEOUS | 28 days supply | Qty: 4 | Fill #1

## 2023-11-09 ENCOUNTER — Other Ambulatory Visit: Payer: Self-pay | Admitting: Internal Medicine

## 2023-11-09 MED ORDER — PROMETHAZINE-DM 6.25-15 MG/5ML PO SYRP: 5.0000 mL | ORAL_SOLUTION | Freq: Four times a day (QID) | ORAL | 0 refills | Status: DC | PRN

## 2023-11-10 ENCOUNTER — Telehealth: Payer: Self-pay

## 2023-11-10 ENCOUNTER — Encounter: Payer: Self-pay | Admitting: Internal Medicine

## 2023-11-10 NOTE — Telephone Encounter (Signed)
 Copied from CRM [id number]. Topic: Clinical - Prescription Issue >> Nov 10, 2023 11:36 AM Nessti S wrote: Reason for CRM: domonique called because promethazine -dextromethorphan (PROMETHAZINE -DM) 6.25-15 MG/5ML syrup needs to be changed to regular because medicaid doesn't pay for the DM

## 2023-11-11 MED ORDER — PROMETHAZINE-PHENYLEPHRINE 6.25-5 MG/5ML PO SYRP: 5.0000 mL | ORAL_SOLUTION | ORAL | 0 refills | Status: DC | PRN

## 2023-11-11 NOTE — Addendum Note (Signed)
 Addended by: Lissett Favorite V on: 11/11/2023 07:33 AM   Modules accepted: Orders

## 2023-11-11 NOTE — Telephone Encounter (Unsigned)
 Copied from CRM [id number]. Topic: Clinical - Prescription Issue >> Nov 11, 2023 12:04 PM Harlene ORN wrote: Reason for CRM: Patient's Pharmacy called to follow up on Promethazine . The Promethazine  was sent, but it was the wrong one; says it's the Promethazine  DM Solution, which is not the syrup that they wanted. Please clarify with [facility] pharmacy. Phone: [phone]

## 2023-11-11 NOTE — Telephone Encounter (Signed)
 Done. Thanks.

## 2023-11-12 ENCOUNTER — Other Ambulatory Visit: Payer: Self-pay | Admitting: Medical Genetics

## 2023-11-12 DIAGNOSIS — Z006 Encounter for examination for normal comparison and control in clinical research program: Secondary | ICD-10-CM

## 2023-11-12 MED ORDER — PROMETHAZINE HCL 25 MG PO TABS: 25.0000 mg | ORAL_TABLET | Freq: Four times a day (QID) | ORAL | Status: AC | PRN

## 2023-11-12 NOTE — Telephone Encounter (Signed)
 I will just sent promethazine  tablets instead.  Thanks

## 2023-11-12 NOTE — Addendum Note (Signed)
 Addended by: Takoda Siedlecki V on: 11/12/2023 01:31 PM   Modules accepted: Orders

## 2023-11-13 MED ORDER — SULFAMETHOXAZOLE 800 MG-TRIMETHOPRIM 160 MG TABLET
ORAL_TABLET | Freq: Two times a day (BID) | ORAL | 0 refills | 14.00000 days | Status: CP
Start: 2023-11-13 — End: ?

## 2023-11-13 MED ORDER — OPZELURA 1.5 % TOPICAL CREAM
TOPICAL | 11 refills | 0.00000 days | Status: CP
Start: 2023-11-13 — End: ?

## 2023-11-13 NOTE — Addendum Note (Signed)
 Addended by: PAULITA BRUCKNER on: 11/13/2023 11:30 AM     Modules accepted: Orders

## 2023-11-13 NOTE — Progress Notes (Signed)
 Patient having increase in painful rash on face and abdomen. Last culture was staph epi and didn't improve with cefadroxil . Got cipro a few days ago from  PCP also not helping. 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 PA was submitteed for Opzelura  as well and she thinks it was approved but pharmacy needed new Rx, which I sent today. Might consider adding Dupixent or change of Humira  to JAKi to cover atopic derm, lichen planus, and sarcoid at the same time.

## 2023-11-20 ENCOUNTER — Ambulatory Visit: Admitting: Endocrinology

## 2023-12-02 ENCOUNTER — Other Ambulatory Visit: Payer: Self-pay | Admitting: Internal Medicine

## 2023-12-02 ENCOUNTER — Encounter: Payer: Self-pay | Admitting: Internal Medicine

## 2023-12-02 DIAGNOSIS — L209 Atopic dermatitis, unspecified: Principal | ICD-10-CM

## 2023-12-02 MED ORDER — HALOBETASOL PROPIONATE 0.05 % TOPICAL OINTMENT
5 refills | 0.00000 days
Start: 2023-12-02 — End: ?

## 2023-12-02 NOTE — Progress Notes (Signed)
 Carolyn Newton reports she's had no sarcoid flares. She's continued to have rash on her face and belly has continued (unclear if this if infection vs. Eczema?) but she was able to get Opzelura  to try and will follow up with clinic if needed.    [facility] Clinical Assessment & Refill Coordination Note    Carolyn Newton, DOB: [birth year]  Phone: [phone] (home)     All above HIPAA information was verified with patient.     Was a nurse, learning disability used for this call? No    Specialty Medication(s):   Inflammatory Disorders: Humira      Current Medications[1]     Changes to medications: Sheina reports no changes at this time.    Medication list has been reviewed and updated in Epic: Yes    Allergies[2]    Changes to allergies: No    Allergies have been reviewed and updated in Epic: Yes    SPECIALTY MEDICATION ADHERENCE     Humira  40 mg: 0 doses of medicine on hand     Medication Adherence    Patient reported X missed doses in the last month: 0  Specialty Medication: Humira           Specialty medication(s) dose(s) confirmed: Regimen is correct and unchanged.     Are there any concerns with adherence? No    Adherence counseling provided? Not needed    CLINICAL MANAGEMENT AND INTERVENTION      Clinical Benefit Assessment:    Do you feel the medicine is effective or helping your condition? Yes    Clinical Benefit counseling provided? Not needed    Adverse Effects Assessment:    Are you experiencing any side effects? No    Are you experiencing difficulty administering your medicine? No    Quality of Life Assessment:    Quality of Life    Rheumatology  Oncology  Dermatology  1. What impact has your specialty medication had on the symptoms of your skin condition (i.e. itchiness, soreness, stinging)?: Some  2. What impact has your specialty medication had on your comfort level with your skin?: Some  Cystic Fibrosis          How many days over the past month did your sarcoidosis  keep you from your normal activities? For example, brushing your teeth or getting up in the morning. 0    Have you discussed this with your provider? Not needed    Acute Infection Status:    Acute infections noted within Epic:  No active infections    Patient reported infection: None    Therapy Appropriateness:    Is the medication and dose appropriate considering the patient???s diagnosis, treatment, and disease journey, comorbidities, medical history, current medications, allergies, therapeutic goals, self-administration ability, and access barriers? Yes, therapy is appropriate and should be continued     Clinical Intervention:    Was an intervention completed as part of this clinical assessment? No    DISEASE/MEDICATION-SPECIFIC INFORMATION      For patients on injectable medications: Next injection is scheduled for 11.19 and 11.26 (this delivery for doses 12.3 through 12.24) .    Chronic Inflammatory Diseases: Have you experienced any flares in the last month? No  Has this been reported to your provider? No    PATIENT SPECIFIC NEEDS     Does the patient have any physical, cognitive, or cultural barriers? No    Is the patient high risk? No    Does the patient require physician intervention or  other additional services (i.e., nutrition, smoking cessation, social work)? No    Does the patient have an additional or emergency contact listed in their chart? Yes    SOCIAL DETERMINANTS OF HEALTH     At the [facility], we have learned that life circumstances - like trouble affording food, housing, utilities, or transportation can affect the health of many of our patients.   That is why we wanted to ask: are you currently experiencing any life circumstances that are negatively impacting your health and/or quality of life? Patient declined to answer    Social Drivers of Health     Food Insecurity: Food Insecurity Present (08/01/2023)    Received from [facility]    Hunger Vital Sign     Within the past 12 months, you worried that your food would run out before you got the money to buy more.: Often true     Within the past 12 months, the food you bought just didn't last and you didn't have money to get more.: Often true   Tobacco Use: Medium Risk (09/22/2023)    Patient History     Smoking Tobacco Use: Former     Smokeless Tobacco Use: Never     Passive Exposure: Not on file   Transportation Needs: Unmet Transportation Needs (08/01/2023)    Received from [facility] - Transportation     In the past 12 months, has lack of transportation kept you from medical appointments or from getting medications?: Yes     In the past 12 months, has lack of transportation kept you from meetings, work, or from getting things needed for daily living?: Yes   Alcohol Use: Not on file   Housing: Not on file   Physical Activity: Unknown (08/01/2023)    Received from [facility]    Exercise Vital Sign     On average, how many days per week do you engage in moderate to strenuous exercise (like a brisk walk)?: Patient declined     Minutes of Exercise per Session: Not on file   Utilities: Not At Risk (04/30/2023)    Received from [facility] Utilities     Threatened with loss of utilities: No   Stress: Stress Concern Present (08/01/2023)    Received from [facility] - Occupational Stress Questionnaire     Do you feel stress - tense, restless, nervous, or anxious, or unable to sleep at night because your mind is troubled all the time - these days?: Very much   Interpersonal Safety: Not on file   Substance Use: Not on file (11/19/2022)   Intimate Partner Violence: Patient Unable To Answer (04/30/2023)    Received from [facility]    Humiliation, Afraid, Rape, and Kick questionnaire     Within the last year, have you been afraid of your partner or ex-partner?: Patient unable to answer     Within the last year, have you been humiliated or emotionally abused in other ways by your partner or ex-partner?: Patient unable to answer     Within the last year, have you been kicked, hit, slapped, or otherwise physically hurt by your partner or ex-partner?: Patient unable to answer     Within the last year, have you been raped or forced to have any kind of sexual activity by your partner or ex-partner?: Patient unable to answer   Social Connections: Unknown (08/01/2023)  Received from [facility]    Social Connection and Isolation Panel     In a typical week, how many times do you talk on the phone with family, friends, or neighbors?: Patient declined     How often do you get together with friends or relatives?: Never     How often do you attend church or religious services?: Never     Do you belong to any clubs or organizations such as church groups, unions, fraternal or athletic groups, or school groups?: Patient declined     Attends Banker Meetings: Not on file     Are you married, widowed, divorced, separated, never married, or living with a partner?: Patient declined   Physicist, Medical Strain: High Risk (08/01/2023)    Received from [facility]    Overall Financial Resource Strain (CARDIA)     How hard is it for you to pay for the very basics like food, housing, medical care, and heating?: Hard   Health Literacy: Adequate Health Literacy (04/30/2023)    Received from [facility]    B1300 Health Literacy     Frequency of need for help with medical instructions: Never   Internet Connectivity: Not on file       Would you be willing to receive help with any of the needs that you have identified today? Not applicable       SHIPPING     Specialty Medication(s) to be Shipped:   Inflammatory Disorders: Humira     Other medication(s) to be shipped: No additional medications requested for fill at this time    Specialty Medications not needed at this time: N/A     Changes to insurance: No    Cost and Payment: Patient has a $0 copay, payment information is not required.    Delivery Scheduled: Yes, Expected medication delivery date: 11/20.     Medication will be delivered via UPS to the confirmed prescription address in [facility].    The patient will receive a drug information handout for each medication shipped and additional FDA Medication Guides as required.  Verified that patient has previously received a Conservation Officer, Historic Buildings and a Surveyor, Mining.    The patient or caregiver noted above participated in the development of this care plan and knows that they can request review of or adjustments to the care plan at any time.      All of the patient's questions and concerns have been addressed.    [facility] Specialty Pharmacist         [1]   Current Outpatient Medications   Medication Sig Dispense Refill    albuterol (PROVENTIL HFA;VENTOLIN HFA) 90 mcg/actuation inhaler Inhale 2 puffs.      albuterol HFA 90 mcg/actuation inhaler Inhale 2 puffs.      blood sugar diagnostic (ON CALL EXPRESS TEST STRIP) Strp Use as instructed to check blood sugar once a day. 100 each 12    blood sugar diagnostic Strp TEST TWICE DAILY      blood-glucose meter (ON CALL EXPRESS METER) Misc Use to check blood sugar once a day 1 each 0    blood-glucose meter kit Use to check blood sugar 2 times per day      carvedilol (COREG) 12.5 MG tablet TAKE 1 TABLET BY MOUTH TWICE DAILY WITH A MEAL      cefadroxil  (DURICEF) 500 MG capsule Take 2 capsules (1,000 mg total) by mouth daily.  20 capsule 0    cholecalciferol, vitamin D3-125 mcg, 5,000 unit,, 125 mcg (5,000 unit) tablet Take 1 tablet (125 mcg total) by mouth.      clobetasoL  (TEMOVATE ) 0.05 % cream Apply topically Two (2) times a day. 60 g 5    clotrimazole -betamethasone  (LOTRISONE ) 1-0.05 % cream Apply topically two (2) times a day. For itchy rash 45 g 5    dexlansoprazole  (DEXILANT ) 60 mg capsule       empty container (SHARPS CONTAINER) Misc Use as directed to dispose of Humira  needles 1 each 2    empty container Misc USE AS DIRECTED 1 each 2    fluocinolone  (DERMA-SMOOTHE/FS BODY OIL) 0.01 % external oil Apply topically daily. 120 mL 11    gabapentin (NEURONTIN) 300 MG capsule Take 1 capsule (300 mg total) by mouth 2 (two) times daily as needed (for pain).  1    halobetasol  (ULTRAVATE ) 0.05 % ointment Apply twice daily to the flaring areas of the body until skin is clear or for no longer than two weeks 60 g 5    HUMIRA  PEN CITRATE FREE 40 MG/0.4 ML Inject the contents of 1 pen (40 mg total) under the skin every seven (7) days. 4 each 11    hydrOXYzine  (ATARAX ) 25 MG tablet Take 1/4 tablet in the morning and 1/2 tablet in the evenings as needed for itch 30 tablet 5    hydrOXYzine  (ATARAX ) 25 MG tablet Take 1 tablet (25 mg total) by mouth.      ketoconazole (NIZORAL) 2 % cream Apply 1 Application topically 2 (two) times daily. Apply under breasts      lancets 28 gauge Misc 1 each.      lancets 30 gauge Misc Use as instructed to test sugars once daily 100 each 12    LYCOPENE ORAL Take by mouth daily.      metFORMIN (GLUCOPHAGE) 500 MG tablet TAKE 2 TABLETS(1000 MG) BY MOUTH DAILY WITH FOOD      metFORMIN (GLUCOPHAGE-XR) 500 MG 24 hr tablet Take 2 tablets (1,000 mg total) by mouth at bedtime.      methocarbamoL (ROBAXIN) 500 MG tablet       morphine (MSIR) 30 MG tablet Take 1 tablet (30 mg total) by mouth.      moxifloxacin  (AVELOX ) 400 mg tablet Take 1 tablet (400 mg total) by mouth daily. 7 tablet 0    multivitamin with minerals tablet Take 1 tablet by mouth daily.      naloxone (NARCAN) 4 mg nasal spray Call 911 Instill ONE SPRAY into one nostril. REPEAT EVERY FIVE MINUTES if no OR minimal response      naltrexone  (DEPADE) 50 mg tablet Split tablets into quarters. Take one-quarter of a tablet once daily 30 tablet 11    potassium chloride (KLOR-CON) 10 MEQ CR tablet TAKE 1 TABLET BY MOUTH DAILY      potassium chloride (KLOR-CON) 10 MEQ CR tablet Take 1 tablet (10 mEq total) by mouth daily.      QUEtiapine (SEROQUEL) 50 MG tablet TAKE ONE TABLET BY MOUTH EVERY MORNING AND TAKE TWO TABLETS BY MOUTH AT BEDTIME      rivaroxaban (XARELTO) 20 mg tablet TAKE 1 TABLET BY MOUTH EVERY DAY WITH SUPPER      ruxolitinib (OPZELURA ) 1.5 % Crea Apply once daily for itchy areas as needed 60 g 11    sulfamethoxazole -trimethoprim  (BACTRIM  DS) 800-160 mg per tablet Take 1 tablet (160 mg of trimethoprim  total) by mouth two (2) times a day.  28 tablet 0    SYMBICORT 160-4.5 mcg/actuation inhaler Inhale 2 puffs into the lungs TWICE DAILY 120/4=30  5    tacrolimus  (PROTOPIC ) 0.1 % ointment Apply twice daily to affected areas as maintenance. Can use to flaring areas on face/body 2-3x weekly once clear to decrease flares/recurrence. 60 g 3    topiramate (TOPAMAX) 50 MG tablet TAKE 1 TABLET BY MOUTH TWICE DAILY      topiramate (TOPAMAX) 50 MG tablet Take 1 tablet (50 mg total) by mouth.      venlafaxine (EFFEXOR-XR) 75 MG 24 hr capsule Take 1 capsule (75 mg total) by mouth.       Current Facility-Administered Medications   Medication Dose Route Frequency Provider Last Rate Last Admin    triamcinolone  acetonide (KENALOG ) injection 10 mg  10 mg Intradermal Once Sayed, Christopher John, MD       [2]   Allergies  Allergen Reactions    Other Other (See Comments)     Patient has Sarcoidosis and can't tolerate any metals    Suvorexant Other (See Comments)     Clemens out of bed while asleep: I'm waking up as I'm falling on the floor; Night terrors    Enalapril Maleate      REACTION: cough    Hydrochlorothiazide      REACTION: Low potassium    Hydroxychloroquine Sulfate      REACTION: vision changes    Iodine      Mother was intolerant--CODED on CT table---pt never tired.    Latex      Hives    Nickel Other (See Comments)     Pt has a titanium right and left knee - nickel causes skin irritations that forms into blisters and sores  Other reaction(s): Other (See Comments)  Pt has a titanium right knee - nickel causes skin irritations that forms into blisters and sores    Adhesive Tape-Silicones Other (See Comments) Medical tape causes bruising    Doxycycline Diarrhea and Nausea And Vomiting    Morphine Itching and Nausea Only    Pregabalin Other (See Comments)     Made me feel high

## 2023-12-03 DIAGNOSIS — L089 Local infection of the skin and subcutaneous tissue, unspecified: Principal | ICD-10-CM

## 2023-12-03 MED ORDER — CEFADROXIL 500 MG CAPSULE
ORAL_CAPSULE | Freq: Every day | ORAL | 0 refills | 10.00000 days | Status: CP
Start: 2023-12-03 — End: ?

## 2023-12-03 MED ORDER — HALOBETASOL PROPIONATE 0.05 % TOPICAL OINTMENT
OPHTHALMIC | 5 refills | 0.00000 days | Status: CP
Start: 2023-12-03 — End: ?

## 2023-12-03 MED FILL — HUMIRA PEN CITRATE FREE 40 MG/0.4 ML: SUBCUTANEOUS | 28 days supply | Qty: 4 | Fill #2

## 2023-12-17 ENCOUNTER — Other Ambulatory Visit: Payer: Self-pay | Admitting: Endocrinology

## 2023-12-17 DIAGNOSIS — E1122 Type 2 diabetes mellitus with diabetic chronic kidney disease: Secondary | ICD-10-CM

## 2023-12-25 ENCOUNTER — Ambulatory Visit: Payer: Self-pay

## 2023-12-25 NOTE — Telephone Encounter (Signed)
 FYI Only or Action Required?: Action required by provider: update on patient condition.  Patient was last seen in primary care on 11/04/2023 by Plotnikov, Karlynn GAILS, MD.  Called Nurse Triage reporting Fall.  Symptoms began several days ago.  Interventions attempted: OTC medications: Tylenol  and Ice/heat application.  Symptoms are: gradually worsening.  Triage Disposition: See HCP Within 4 Hours (Or PCP Triage)  Patient/caregiver understands and will follow disposition?: No, wishes to speak with PCP              Copied from CRM #[id number]. Topic: Clinical - Red Word Triage >> Dec 25, 2023  2:27 PM Viola F wrote: Red Word that prompted transfer to Nurse Triage: Patient fell Monday and is in a lot of pain Reason for Disposition  [1] SEVERE pain (e.g., excruciating) AND [2] not improved 2 hours after pain medicine/ice packs  Answer Assessment - Initial Assessment Questions Called CAL and notified of ED refusal.   1. MECHANISM: How did the injury happen? (e.g., twisting injury, direct blow)      Trip and fall. She states she tripped on her oxygen  tubing and fell at home landing on hardwood flooring on her right side of her body.  2. ONSET: When did the injury happen? (e.g., minutes, hours ago)      Monday.  3. LOCATION: Where is the injury located?      Right hip and radiates down right leg.  4. APPEARANCE of INJURY: What does the injury look like?  (e.g., deformity of leg)     No deformity.  5. SEVERITY: Can you put weight on that leg? Can you walk?      She states she has been able to walk but it is very painful to bear weight and feels similar to when she broke her femur.  6. SIZE: For cuts, bruises, or swelling, ask: How large is it? (e.g., inches or centimeters;  entire joint)      No cuts, bruises, swelling or deformities noted per patient.  7. PAIN: Is there pain? If Yes, ask: How bad is the pain?   What does it keep you from doing? (Scale  0-10; or none, mild, moderate, severe)     Yes. 10/10 with Tylenol  and heat.  8. TETANUS: For any breaks in the skin, ask: When was your last tetanus booster?     N/A.  9. OTHER SYMPTOMS: Do you have any other symptoms?      Right side and back pain.  Protocols used: Hip Injury-A-AH

## 2023-12-26 ENCOUNTER — Ambulatory Visit: Payer: Self-pay

## 2023-12-26 NOTE — Telephone Encounter (Signed)
 Please inform the pt of the following upon her call back to the clinic as her PCP is out of the office Patient needs an OV or urgent care. PCP is out of the office currenlty.

## 2023-12-26 NOTE — Telephone Encounter (Signed)
 FYI Only or Action Required?: FYI only for provider: Advised UC or OV. PT states she will have no transportation until Thursday of next week and she is fine waiting. She has ordered a medication from [employer] and it is working well..  Patient was last seen in primary care on 11/04/2023 by Plotnikov, Karlynn GAILS, MD.  Called Nurse Triage reporting Advice Only.  Symptoms began several days ago.  Interventions attempted: OTC medications: medication from [facility].  Symptoms are: unchanged.  Triage Disposition: Call PCP Now  Patient/caregiver understands and will follow disposition?: No - Pt refused ED earlier this week. Now has rescheduled OV for Thursday.                    Copied from CRM [id number]. Topic: Clinical - Red Word Triage >> Dec 26, 2023  4:29 PM Jayma L wrote: Red Word that prompted transfer to Nurse Triage: pain is getting worse where she fell on hip on Monday Reason for Disposition  [1] Follow-up call from patient regarding patient's clinical status AND [2] information urgent  Answer Assessment - Initial Assessment Questions 1. REASON FOR CALL: What is the main reason for your call? or How can I best help you?     Pt needs to change appointment. She will not have transportation until Thursday. Pt refuses ED UC or sooner appt d/t transportation issues. 2. SYMPTOMS : Do you have any symptoms?      Hip pain from fall on Monday  Answer Assessment - Initial Assessment Questions 1. REASON FOR CALL or QUESTION: What is your reason for calling today? or How can I best     Pt needs to change appointment. She will not have transportation until Thursday. Pt refuses ED UC or sooner appt d/t transportation issues.  2. CALLER: Document the source of call. (e.g., laboratory staff, caregiver or patient).     pt  Protocols used: Information Only Call - No Triage-A-AH, PCP Call - No Triage-A-AH

## 2023-12-30 ENCOUNTER — Other Ambulatory Visit: Payer: Self-pay | Admitting: Family

## 2023-12-30 ENCOUNTER — Other Ambulatory Visit: Payer: Self-pay | Admitting: Internal Medicine

## 2023-12-30 MED ORDER — TOPIRAMATE 50 MG PO TABS: 50.0000 mg | ORAL_TABLET | Freq: Two times a day (BID) | ORAL | 1 refills | Status: AC

## 2023-12-30 NOTE — Telephone Encounter (Signed)
 FYI:  FYI Only or Action Required?: FYI only for provider: Advised UC or OV. PT states she will have no transportation until Thursday of next week and she is fine waiting. She has ordered a medication from [employer] and it is working well..   Patient was last seen in primary care on 11/04/2023 by Plotnikov, Karlynn GAILS, MD.   Called Nurse Triage reporting Advice Only.   Symptoms began several days ago.   Interventions attempted: OTC medications: medication from [facility].   Symptoms are: unchanged.   Triage Disposition: Call PCP Now   Patient/caregiver understands and will follow disposition?: No - Pt refused ED earlier this week. Now has rescheduled OV for Thursday.

## 2023-12-31 ENCOUNTER — Ambulatory Visit: Admitting: Internal Medicine

## 2024-01-01 ENCOUNTER — Ambulatory Visit: Admitting: Emergency Medicine

## 2024-01-01 ENCOUNTER — Ambulatory Visit: Payer: Self-pay | Admitting: Emergency Medicine

## 2024-01-01 ENCOUNTER — Ambulatory Visit (INDEPENDENT_AMBULATORY_CARE_PROVIDER_SITE_OTHER)

## 2024-01-01 ENCOUNTER — Encounter: Payer: Self-pay | Admitting: Emergency Medicine

## 2024-01-01 VITALS — BP 116/80 | HR 69 | Temp 97.8°F | Ht 67.0 in | Wt 256.0 lb

## 2024-01-01 DIAGNOSIS — S79911A Unspecified injury of right hip, initial encounter: Secondary | ICD-10-CM | POA: Insufficient documentation

## 2024-01-01 DIAGNOSIS — W19XXXA Unspecified fall, initial encounter: Secondary | ICD-10-CM

## 2024-01-01 DIAGNOSIS — M25551 Pain in right hip: Secondary | ICD-10-CM

## 2024-01-01 MED ORDER — HYDROCODONE-ACETAMINOPHEN 5-325 MG PO TABS: 1.0000 | ORAL_TABLET | Freq: Four times a day (QID) | ORAL | 0 refills | Status: DC | PRN

## 2024-01-01 NOTE — Progress Notes (Signed)
 [facility] Refill Coordination Note    Specialty Medication(s) to be Shipped:   Inflammatory Disorders: Humira     Other medication(s) to be shipped: No additional medications requested for fill at this time    Specialty Medications not needed at this time: N/A     Carolyn Newton, DOB: 09-27-1960  Phone: [phone] (home)       All above HIPAA information was verified with patient.     Was a nurse, learning disability used for this call? No    Completed refill call assessment today to schedule patient's medication shipment from the [facility]  [phone]).  All relevant notes have been reviewed.     Specialty medication(s) and dose(s) confirmed: Regimen is correct and unchanged.   Changes to medications: [name] reports no changes at this time.  Changes to insurance: No  New side effects reported not previously addressed with a pharmacist or physician: None reported  Questions for the pharmacist: No    Confirmed patient received a Conservation Officer, Historic Buildings and a Surveyor, Mining with first shipment. The patient will receive a drug information handout for each medication shipped and additional FDA Medication Guides as required.       DISEASE/MEDICATION-SPECIFIC INFORMATION        N/A    SPECIALTY MEDICATION ADHERENCE     Medication Adherence    Patient reported X missed doses in the last month: 0  Specialty Medication: HUMIRA (CF) PEN 40 mg/0.4 mL injection (adalimumab )              Were doses missed due to medication being on hold? No     HUMIRA (CF) PEN 40 mg/0.4 mL injection (adalimumab ): 2 doses of medicine on hand       Specialty medication is an injection or given on a cycle: Yes, Next cycle is scheduled to start 01/07/24.    REFERRAL TO PHARMACIST     Referral to the pharmacist: Not needed      [facility]     Shipping address confirmed in Epic.     Cost and Payment: Patient has a $0 copay, payment information is not required.    Delivery Scheduled: Yes, Expected medication delivery date: 01/06/24.     Medication will be delivered via UPS to the prescription address in Epic WAM.    [name]   [facility]  Specialty Technician

## 2024-01-01 NOTE — Patient Instructions (Signed)
 Hip Pain The hip is the joint between the upper legs and the lower pelvis. The bones, cartilage, tendons, and muscles of your hip joint support your body and allow you to move around. Hip pain can range from a minor ache to severe pain in one or both of your hips. The pain may be felt on the inside of the hip joint near the groin, or on the outside near the buttocks and upper thigh. You may also have swelling or stiffness in your hip area. Follow these instructions at home: Managing pain, stiffness, and swelling     If told, put ice on the painful area. Put ice in a plastic bag. Place a towel between your skin and the bag. Leave the ice on for 20 minutes, 2-3 times a day. If told, apply heat to the affected area as often as told by your health care provider. Use the heat source that your provider recommends, such as a moist heat pack or a heating pad. Place a towel between your skin and the heat source. Leave the heat on for 20-30 minutes. If your skin turns bright red, remove the ice or heat right away to prevent skin damage. The risk of damage is higher if you cannot feel pain, heat, or cold. Activity Do exercises as told by your provider. Avoid activities that cause pain. General instructions  Take over-the-counter and prescription medicines only as told by your provider. Keep a journal of your symptoms. Write down: How often you have hip pain. The location of your pain. What the pain feels like. What makes the pain worse. Sleep with a pillow between your legs on your most comfortable side. Keep all follow-up visits. Your provider will monitor your pain and activity. Contact a health care provider if: You cannot put weight on your leg. Your pain or swelling gets worse after a week. It gets harder to walk. You have a fever. Get help right away if: You fall. You have a sudden increase in pain and swelling in your hip. Your hip is red or swollen or very tender to touch. This  information is not intended to replace advice given to you by your health care provider. Make sure you discuss any questions you have with your health care provider. Document Revised: 09/04/2021 Document Reviewed: 09/04/2021 [employer] Patient Education  2024 [employer].

## 2024-01-01 NOTE — Assessment & Plan Note (Signed)
 Secondary to accidental fall Able to ambulate using walker Recommend x-ray today.  Will review images when available Differential diagnosis discussed.  Clinically stable.

## 2024-01-01 NOTE — Assessment & Plan Note (Signed)
 States she tripped on oxygen  tubing last Monday Uses walker at all times.

## 2024-01-01 NOTE — Assessment & Plan Note (Signed)
 Secondary to recent injury Pain management discussed Recommend Tylenol  for mild to moderate pain and Norco for moderate to severe pain

## 2024-01-01 NOTE — Progress Notes (Addendum)
 [name] 63 y.o.   Chief Complaint  Patient presents with   Hip Pain    Pt states that her hip pain is due to her having a fall last Monday, Pt is requesting pain medication     HISTORY OF PRESENT ILLNESS: Acute problem visit today. This is a 63 y.o. female complaining of pain to right hip Tripped and fell last Monday.  Hurting since. Tylenol  not helping much.  Needs something stronger. No other injuries.  No other complaints or medical concerns today.  Hip Pain      Prior to Admission medications  Medication Sig Start Date End Date Taking? Authorizing Provider  ACCU-CHEK GUIDE TEST test strip USSE TO CHECK BLOOD SUGAR MORNING, NOON, AND AT BEDTIME- THREE TIMES DAILY 12/17/23  Yes Thapa, Sudan, MD  Accu-Chek Softclix Lancets lancets USE TO CHECK BLOOD SUGAR MORNING, NOON, AND AT BEDTIME- THREE TIMES DAILY 12/17/23  Yes Thapa, Sudan, MD  acetaminophen  (TYLENOL ) 325 MG tablet Take 1-2 tablets (325-650 mg total) by mouth every 6 (six) hours as needed for mild pain (pain score 1-3 or temp > 100.5). 08/06/21  Yes Angela Cordella Hamilton, MD  albuterol  (PROVENTIL ) (2.5 MG/3ML) 0.083% nebulizer solution Take 3 mLs (2.5 mg total) by nebulization every 6 (six) hours as needed for wheezing or shortness of breath. 04/18/22 01/01/24 Yes Parrett, Tammy S, NP  albuterol  (VENTOLIN  HFA) 108 (90 Base) MCG/ACT inhaler INHALE TWO puffs into THE lungs EVERY SIX HOURS AS NEEDED wheezind AND For SHORTNESS OF BREATH 04/15/22  Yes Parrett, Tammy S, NP  Blood Glucose Monitoring Suppl DEVI 1 each by Does not apply route in the morning, at noon, and at bedtime. May substitute to any manufacturer covered by patient's insurance. 04/21/23  Yes Thapa, Sudan, MD  budesonide -formoterol  (SYMBICORT ) 80-4.5 MCG/ACT inhaler INHALE TWO puffs into THE lungs TWICE DAILY 11/05/23  Yes Parrett, Tammy S, NP  carvedilol  (COREG ) 12.5 MG tablet Take 1 tablet (12.5 mg total) by mouth 2 (two) times daily with a meal. 11/04/23   Yes Plotnikov, Karlynn GAILS, MD  Cholecalciferol  (VITAMIN D -3) 125 MCG (5000 UT) TABS Take 5,000 Units by mouth daily.   Yes [provider]  cholestyramine  (QUESTRAN ) 4 g packet Take 1 packet (4 g total) by mouth daily. 10/02/23 03/30/24 Yes [place], PA-C  colesevelam  (WELCHOL ) 625 MG tablet TAKE TWO TABLETS (1250MG  TOTAL) BY MOUTH TWICE DAILY With meals 12/03/23  Yes Plotnikov, Karlynn GAILS, MD  Cyanocobalamin  (VITAMIN B-12) 1000 MCG SUBL Place 1 tablet (1,000 mcg total) under the tongue daily. 11/15/19  Yes Plotnikov, Karlynn GAILS, MD  dexlansoprazole  (DEXILANT ) 60 MG capsule TAKE ONE CAPSULE BY MOUTH EVERY DAY 09/16/23  Yes Plotnikov, Aleksei V, MD  diphenoxylate -atropine  (LOMOTIL ) 2.5-0.025 MG tablet Take 1-2 tablets by mouth 4 (four) times daily as needed for diarrhea or loose stools. 12/11/22  Yes Plotnikov, Karlynn GAILS, MD  gabapentin  (NEURONTIN ) 300 MG capsule TAKE ONE CAPSULE BY MOUTH EVERY MORNING. MAY take a second DOSE during THE DAY as needed 11/06/23  Yes Plotnikov, Karlynn GAILS, MD  glucose blood (ACCU-CHEK GUIDE) test strip Use to check blood sugar once a day. 05/24/19  Yes Trixie File, MD  Glucose Blood (BLOOD GLUCOSE TEST STRIPS) STRP Use to check blood sugars every day. May substitute to any manufacturer covered by patient's insurance. 04/23/23  Yes Thapa, Sudan, MD  HUMIRA PEN 40 MG/0.4ML PNKT Inject 40 mg as directed every Wednesday.  04/13/19  Yes [provider]  hydrOXYzine  (ATARAX ) 25 MG tablet TAKE  ONE TABLET BY MOUTH EVERY EIGHT HOURS AS NEEDED 09/16/23  Yes Plotnikov, Karlynn GAILS, MD  Lancets Misc. MISC Use to check blood sugars every day. May substitute to any manufacturer covered by patient's insurance. 04/23/23  Yes Thapa, Sudan, MD  metFORMIN  (GLUCOPHAGE -XR) 500 MG 24 hr tablet Take 1 tablet (500 mg total) by mouth daily with supper. 04/21/23  Yes Thapa, Sudan, MD  Multiple Vitamins-Minerals (MULTIVITAMIN WOMEN 50+) TABS Take 1 tablet by mouth daily.   Yes [provider]  naloxone  (NARCAN ) nasal spray 4 mg/0.1 mL Place 1 spray into the nose once as needed (overdose).   Yes [provider]  naltrexone (DEPADE) 50 MG tablet Take 50 mg by mouth daily. 11/29/21  Yes [provider]  OXYGEN  Inhale 2 L into the lungs continuous.   Yes [provider]  potassium chloride  (KLOR-CON  M) 10 MEQ tablet TAKE ONE TABLET BY MOUTH EVERY DAY 08/13/23  Yes Plotnikov, Aleksei V, MD  promethazine  (PHENERGAN ) 25 MG tablet Take 1 tablet (25 mg total) by mouth every 6 (six) hours as needed for up to 7 days for nausea (As needed for cough). 11/12/23 01/01/24 Yes Plotnikov, Aleksei V, MD  QUEtiapine  (SEROQUEL  XR) 200 MG 24 hr tablet TAKE ONE TABLET BY MOUTH AT BEDTIME .** STOP 300mg ** 09/16/23  Yes Plotnikov, Karlynn GAILS, MD  QUEtiapine  (SEROQUEL ) 50 MG tablet TAKE ONE TABLET BY MOUTH EVERY MORNING, TAKE TWO TABLETS BY MOUTH AT BEDTIME 10/03/23  Yes Plotnikov, Aleksei V, MD  sulfamethoxazole -trimethoprim  (BACTRIM  DS) 800-160 MG tablet Take 1 tablet by mouth 2 (two) times daily. 09/05/23  Yes Plotnikov, Karlynn GAILS, MD  tobramycin  (TOBREX ) 0.3 % ophthalmic solution SMARTSIG:In Eye(s) 07/25/23  Yes [provider]  tobramycin -dexamethasone  (TOBRADEX ) ophthalmic solution Place 1 drop into both eyes every 6 (six) hours. 09/05/23  Yes Plotnikov, Karlynn GAILS, MD  topiramate  (TOPAMAX ) 50 MG tablet Take 1 tablet (50 mg total) by mouth 2 (two) times daily. 12/30/23  Yes Douglass Caul B, FNP  venlafaxine  XR (EFFEXOR -XR) 75 MG 24 hr capsule TAKE ONE CAPSULE BY MOUTH EVERY DAY WITH breakfast 07/21/23  Yes Plotnikov, Aleksei V, MD  XARELTO  20 MG TABS tablet TAKE ONE TABLET BY MOUTH EVERY DAY 02/21/23  Yes Plotnikov, Aleksei V, MD  clotrimazole -betamethasone  (LOTRISONE ) cream APPLY TO THE AFFECTED AREA(S) TWICE DAILY Patient not taking: Reported on 01/01/2024 08/19/23   Plotnikov, Karlynn GAILS, MD    Allergies[1]  Patient Active Problem List   Diagnosis Date Noted    Lichen planus 11/04/2023   Cellulitis of ear 08/04/2023   Bilateral conjunctivitis 07/29/2023   Hx of colonic polyps 07/15/2023   Eczema of external ear, left 06/04/2023   Long term (current) use of opiate analgesic 03/10/2023   Ankle sprain 10/29/2022   Fall 10/29/2022   Rib pain on right side 08/26/2022   Fall against object 08/26/2022   Trigeminal neuralgia 04/09/2022   Vision loss, bilateral 03/12/2022   Red eyes 03/12/2022   Arthralgia of left temporomandibular joint 01/24/2022   Purpura, allergic 12/10/2021   Intertrigo 12/10/2021   Gait disorder 08/28/2021   Closed fracture of distal end of femur, unspecified fracture morphology, unspecified laterality, initial encounter (HCC) 07/31/2021   Chronic pain disorder 03/23/2021   Lumbosacral spondylosis without myelopathy 12/13/2020   Dacrocystitis, left 11/20/2020   Well adult exam 07/19/2020   Chronic diarrhea    Shoulder pain, bilateral 02/01/2019   Physical deconditioning 01/28/2019   Chronic anticoagulation 11/19/2018   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)  11/19/2018   Oxygen  dependent 11/19/2018   Family history of colon cancer 10/26/2018   Ozena 06/12/2018   Mixed conductive and sensorineural hearing loss of left ear with restricted hearing of right ear 06/04/2018   Thrush, oral 01/15/2018   Dyslipidemia 10/28/2017   Pruritus 10/23/2017   Anemia 09/24/2017   Migraines 09/17/2017   Low back pain 12/17/2016   Diastolic dysfunction 11/11/2016   OSA (obstructive sleep apnea) 09/30/2016   Chronic respiratory failure with hypoxia (HCC) 08/23/2016   Pulmonary embolism (HCC) 07/21/2016   DVT (deep venous thrombosis) (HCC) 07/21/2016   Acute bronchitis 01/30/2016   Weight gain 12/20/2015   Controlled type 2 diabetes mellitus with chronic kidney disease, without long-term current use of insulin  (HCC) 02/01/2015   Abnormal SPEP 06/04/2013   Memory loss 04/27/2013   Panic attacks 03/17/2013   Rash 10/27/2012    CAD in native artery 05/28/2012   Seborrheic dermatitis 01/20/2012   Obesity, Class III, BMI 40-49.9 (morbid obesity) (HCC) 03/07/2010   INSOMNIA, CHRONIC 03/07/2010   Allergic rhinitis 10/26/2009   Grief reaction 08/01/2009   Essential hypertension 08/27/2007   B12 deficiency 08/25/2007   Vitamin D  deficiency 08/25/2007   Osteoarthritis 02/02/2007   Sarcoidosis 08/14/2006   Anxiety disorder 08/14/2006   Major depressive disorder, recurrent episode 08/14/2006   GERD (gastroesophageal reflux disease) 08/14/2006    Past Medical History:  Diagnosis Date   ALLERGIC RHINITIS 10/26/2009   no current problems   Anxiety    B12 DEFICIENCY 08/25/2007   takes B12 supplement   Complication of anesthesia    pt has had difficulty following anesthesia with her knee in 2016-unable to care for herself afterward   Depression    takes effexor  xr daily   Diabetes mellitus without complication (HCC)    was on insulin  but has been off since Nov 2015 and now only takes Metformin  daily   DYSPNEA 04/28/2009   uses oxygen  24/7, on 2L via [place]   Esophageal reflux    takes Nexium  daily   Fibromyalgia    Headache    last migraine 2-41yrs ago;takes Topamax  daily   History of shingles    Hypertension    takes Coreg  daily   Insomnia    takes Nortriptyline  nightly    Long-term memory loss    OSA (obstructive sleep apnea)    doesn't use CPAP;sleep study in epic from 2006   Osteoarthritis    Panic attacks    PONV (postoperative nausea and vomiting)    on time in 2016 knee surgery   Rheumatoid arthritis (HCC)    Sarcoidosis    Dr. Zeminski   Stroke [facility])    Vitamin D  deficiency    is supposed to take Vit D but can't afford it    Past Surgical History:  Procedure Laterality Date   APPENDECTOMY     arthroscopic knee surgery Right 11-12-04   AXILLARY ABCESS IRRIGATION AND DEBRIDEMENT  Jul & [id number]   BIOPSY  11/02/2019   Procedure: BIOPSY;  Surgeon: Albertus Gordy HERO, MD;  Location: [facility];   Service: Gastroenterology;;   ORIN MEDIATE RELEASE Left 05/23/2014   Procedure: CARPAL TUNNEL RELEASE;  Surgeon: Glendia Cordella Hutchinson, MD;  Location: [facility] OR;  Service: Orthopedics;  Laterality: Left;   CHOLECYSTECTOMY N/A 02/11/2018   Procedure: LAPAROSCOPIC CHOLECYSTECTOMY WITH INTRAOPERATIVE CHOLANGIOGRAM ERAS PATHWAY;  Surgeon: Belinda Cough, MD;  Location: [facility] OR;  Service: General;  Laterality: N/A;   COLONOSCOPY N/A 07/15/2023   Procedure: COLONOSCOPY;  Surgeon: Albertus Gordy HERO,  MD;  Location: [facility];  Service: Gastroenterology;  Laterality: N/A;   COLONOSCOPY WITH PROPOFOL  N/A 11/02/2019   Procedure: COLONOSCOPY WITH PROPOFOL ;  Surgeon: Albertus Gordy HERO, MD;  Location: [facility];  Service: Gastroenterology;  Laterality: N/A;   cyst removed from top of buttocks  at age 62   DACRORHINOCYSTOTOMY Bilateral 03/23/2021   Procedure: ENDOSCOPIC DACROCYSTORHINOSTOMY;  Surgeon: Rodgers Faden, MD;  Location: [facility] OR;  Service: Ophthalmology;  Laterality: Bilateral;   ENDOMETRIAL ABLATION     IR CHOLANGIOGRAM EXISTING TUBE  11/21/2017   IR CHOLANGIOGRAM EXISTING TUBE  01/16/2018   IR EXCHANGE BILIARY DRAIN  11/10/2017   IR PATIENT EVAL TECH 0-60 MINS  02/03/2018   IR PERC CHOLECYSTOSTOMY  09/13/2017   IR RADIOLOGIST EVAL & MGMT  10/08/2017   LACRIMAL DUCT EXPLORATION Right 06/26/2017   Procedure: LACRIMAL DUCT EXPLORATION AND ETHMOIDECTOMY;  Surgeon: Laurie Loyd Redhead, MD;  Location: [facility] OR;  Service: Ophthalmology;  Laterality: Right;   LACRIMAL DUCT EXPLORATION Bilateral 03/23/2021   Procedure: NASAL LACRIMAL DUCT EXPLORATION;  Surgeon: Rodgers Faden, MD;  Location: [facility] OR;  Service: Ophthalmology;  Laterality: Bilateral;   LACRIMAL TUBE INSERTION Bilateral 03/23/2021   Procedure: INSERTION OF CRAWFORD LACRIMAL STENT;  Surgeon: Rodgers Faden, MD;  Location: [facility] OR;  Service: Ophthalmology;  Laterality: Bilateral;   ORIF FEMUR FRACTURE Left 07/31/2021   Procedure: left medial femoral condyle fracture  fixation;  Surgeon: Angela Cordella Hamilton, MD;  Location: [facility] OR;  Service: Orthopedics;  Laterality: Left;   POLYPECTOMY  11/02/2019   Procedure: POLYPECTOMY;  Surgeon: Albertus Gordy HERO, MD;  Location: [facility];  Service: Gastroenterology;;   TEAR DUCT PROBING Right 06/26/2017   Procedure: TEAR DUCT PROBING WITH STENT;  Surgeon: Laurie Loyd Redhead, MD;  Location: [facility] OR;  Service: Ophthalmology;  Laterality: Right;   TOTAL KNEE ARTHROPLASTY Right 11/15/2014   Procedure: TOTAL RIGHT KNEE ARTHROPLASTY;  Surgeon: Garrison Cordella Addie, MD;  Location: [facility] OR;  Service: Orthopedics;  Laterality: Right;   TOTAL KNEE ARTHROPLASTY Left 07/13/2015   Procedure: LEFT TOTAL KNEE ARTHROPLASTY;  Surgeon: Garrison Cordella Addie, MD;  Location: [facility] OR;  Service: Orthopedics;  Laterality: Left;    Social History   Socioeconomic History   Marital status: Single    Spouse name: Not on file   Number of children: 1   Years of education: Not on file   Highest education level: 12th grade  Occupational History   Occupation: disabled  Tobacco Use   Smoking status: Former    Current packs/day: 0.00    Average packs/day: 0.5 packs/day for 16.0 years (8.0 ttl pk-yrs)    Types: Cigarettes    Start date: 31    Quit date: 2004    Years since quitting: 21.9   Smokeless tobacco: Never   Tobacco comments:    quit smoking in 2004  Vaping Use   Vaping status: Never Used  Substance and Sexual Activity   Alcohol  use: Not Currently    Alcohol /week: 1.0 standard drink of alcohol     Types: 1 Glasses of wine per week   Drug use: No   Sexual activity: Not Currently    Birth control/protection: Post-menopausal  Other Topics Concern   Not on file  Social History Narrative   Single, broke up with partner in 2008.      Lives at home alone/2025   Caffeine: stopped 2009   Social Drivers of Health   Tobacco Use: Medium Risk (01/01/2024)   Patient History    Smoking Tobacco  Use: Former    Smokeless Tobacco Use: Never     Passive Exposure: Not on Actuary Strain: Low Risk (11/03/2023)   Overall Financial Resource Strain (CARDIA)    Difficulty of Paying Living Expenses: Not hard at all  Food Insecurity: No Food Insecurity (11/03/2023)   Epic    Worried About Programme Researcher, Broadcasting/film/video in the Last Year: Never true    Ran Out of Food in the Last Year: Never true  Transportation Needs: Unmet Transportation Needs (11/03/2023)   Epic    Lack of Transportation (Medical): Yes    Lack of Transportation (Non-Medical): Yes  Physical Activity: Insufficiently Active (11/03/2023)   Exercise Vital Sign    Days of Exercise per Week: 2 days    Minutes of Exercise per Session: 10 min  Stress: Stress Concern Present (11/03/2023)   [employer] of Occupational Health - Occupational Stress Questionnaire    Feeling of Stress: Very much  Social Connections: Socially Isolated (11/03/2023)   Social Connection and Isolation Panel    Frequency of Communication with Friends and Family: Never    Frequency of Social Gatherings with Friends and Family: Never    Attends Religious Services: Never    Database Administrator or Organizations: No    Attends Engineer, Structural: Not on file    Marital Status: Never married  Intimate Partner Violence: Patient Unable To Answer (04/30/2023)   Humiliation, Afraid, Rape, and Kick questionnaire    Fear of Current or Ex-Partner: Patient unable to answer    Emotionally Abused: Patient unable to answer    Physically Abused: Patient unable to answer    Sexually Abused: Patient unable to answer  Depression (PHQ2-9): Low Risk (01/01/2024)   Depression (PHQ2-9)    PHQ-2 Score: 0  Recent Concern: Depression (PHQ2-9) - High Risk (11/04/2023)   Depression (PHQ2-9)    PHQ-2 Score: 24  Alcohol  Screen: Low Risk (04/30/2023)   Alcohol  Screen    Last Alcohol  Screening Score (AUDIT): 0  Housing: Low Risk (11/03/2023)   Epic    Unable to Pay for Housing in the Last Year: No     Number of Times Moved in the Last Year: 0    Homeless in the Last Year: No  Utilities: Not At Risk (04/30/2023)   AHC Utilities    Threatened with loss of utilities: No  Health Literacy: Adequate Health Literacy (04/30/2023)   B1300 Health Literacy    Frequency of need for help with medical instructions: Never    Family History  Problem Relation Age of Onset   Heart disease Mother    Kidney disease Mother        renal failure   Cancer Father        leukemia   Hypertension Other    Coronary artery disease Other        female 1st degree relative <60   Heart failure Other        congestive   Coronary artery disease Other        Female 1st degree relative <50   Breast cancer Other        1st degree relative <50 S   Breast cancer Sister    Colon cancer Brother 82   Stroke Brother    Heart attack Brother    Cancer Brother 31       colon ca   Esophageal cancer Neg Hx    Stomach cancer Neg Hx    Rectal cancer Neg  Hx      Review of Systems  Constitutional: Negative.  Negative for chills and fever.  HENT:  Negative for congestion and sore throat.   Respiratory: Negative.  Negative for cough and shortness of breath.   Cardiovascular: Negative.  Negative for chest pain and palpitations.  Gastrointestinal:  Negative for abdominal pain, diarrhea, nausea and vomiting.  Genitourinary: Negative.  Negative for dysuria and hematuria.  Musculoskeletal:  Positive for joint pain (Right hip pain).  Skin: Negative.  Negative for rash.  Neurological: Negative.  Negative for dizziness and headaches.  All other systems reviewed and are negative.   Vitals:   01/01/24 0835  BP: 116/80  Pulse: 69  Temp: 97.8 F (36.6 C)  SpO2: 92%    Physical Exam Vitals reviewed.  Constitutional:      Appearance: Normal appearance. She is obese.  HENT:     Head: Normocephalic.  Eyes:     Extraocular Movements: Extraocular movements intact.  Cardiovascular:     Rate and Rhythm: Normal rate.   Pulmonary:     Effort: Pulmonary effort is normal.  Musculoskeletal:     Comments: Right hip: Difficult to evaluate due to morbid obesity Able ambulate using walker  Skin:    General: Skin is warm and dry.  Neurological:     Mental Status: She is alert and oriented to person, place, and time.  Psychiatric:        Mood and Affect: Mood normal.        Behavior: Behavior normal.   DG HIP UNILAT W OR W/O PELVIS 2-3 VIEWS RIGHT Result Date: 01/01/2024 CLINICAL DATA:  Right hip pain after fall last week. EXAM: DG HIP (WITH OR WITHOUT PELVIS) 2-3V RIGHT COMPARISON:  Pelvis/right hip 03/14/2021 FINDINGS: Mild symmetric osteoarthritic change of the hips. No evidence of acute fracture or dislocation. Degenerative change of the spine and sacroiliac joints. IMPRESSION: 1. No acute findings. 2. Mild symmetric osteoarthritic change of the hips. Electronically Signed   By: Toribio Agreste M.D.   On: 01/01/2024 09:15      ASSESSMENT & PLAN: Problem List Items Addressed This Visit       Other   Accidental fall   States she tripped on oxygen  tubing last Monday Uses walker at all times.      Relevant Orders   DG HIP UNILAT W OR W/O PELVIS 2-3 VIEWS RIGHT   Right hip pain - Primary   Secondary to recent injury Pain management discussed Recommend Tylenol  for mild to moderate pain and Norco for moderate to severe pain      Relevant Medications   HYDROcodone -acetaminophen  (NORCO/VICODIN) 5-325 MG tablet   Other Relevant Orders   DG HIP UNILAT W OR W/O PELVIS 2-3 VIEWS RIGHT   Hip injury, right, initial encounter   Secondary to accidental fall Able to ambulate using walker Recommend x-ray today.  Will review images when available Differential diagnosis discussed.  Clinically stable.       Relevant Orders   DG HIP UNILAT W OR W/O PELVIS 2-3 VIEWS RIGHT   Patient Instructions  Hip Pain The hip is the joint between the upper legs and the lower pelvis. The bones, cartilage, tendons, and  muscles of your hip joint support your body and allow you to move around. Hip pain can range from a minor ache to severe pain in one or both of your hips. The pain may be felt on the inside of the hip joint near the groin, or on the outside  near the buttocks and upper thigh. You may also have swelling or stiffness in your hip area. Follow these instructions at home: Managing pain, stiffness, and swelling     If told, put ice on the painful area. Put ice in a plastic bag. Place a towel between your skin and the bag. Leave the ice on for 20 minutes, 2-3 times a day. If told, apply heat to the affected area as often as told by your health care provider. Use the heat source that your provider recommends, such as a moist heat pack or a heating pad. Place a towel between your skin and the heat source. Leave the heat on for 20-30 minutes. If your skin turns bright red, remove the ice or heat right away to prevent skin damage. The risk of damage is higher if you cannot feel pain, heat, or cold. Activity Do exercises as told by your provider. Avoid activities that cause pain. General instructions  Take over-the-counter and prescription medicines only as told by your provider. Keep a journal of your symptoms. Write down: How often you have hip pain. The location of your pain. What the pain feels like. What makes the pain worse. Sleep with a pillow between your legs on your most comfortable side. Keep all follow-up visits. Your provider will monitor your pain and activity. Contact a health care provider if: You cannot put weight on your leg. Your pain or swelling gets worse after a week. It gets harder to walk. You have a fever. Get help right away if: You fall. You have a sudden increase in pain and swelling in your hip. Your hip is red or swollen or very tender to touch. This information is not intended to replace advice given to you by your health care provider. Make sure you discuss any  questions you have with your health care provider. Document Revised: 09/04/2021 Document Reviewed: 09/04/2021 Elsevier Patient Education  2024 Elsevier Inc.    Emil Schaumann, MD [facility]     [1]  Allergies Allergen Reactions   Belsomra  [Suvorexant ] Other (See Comments)    Clemens out of bed while asleep: I'm waking up as I'm falling on the floor; Night terrors   Enalapril Maleate Cough   Latex Hives   Nickel Other (See Comments)    Blisters  Pt has a titanium right and left knee - nickel causes skin irritations that form into blisters and sores   Other Other (See Comments)    Patient has Sarcoidosis and can't tolerate any metals   Augmentin  [Amoxicillin -Pot Clavulanate]     Purpura type rash   Fentanyl      Skin irritation   Morphine  And Codeine Itching and Nausea Only   Doxycycline  Diarrhea and Nausea And Vomiting   Hydrochlorothiazide Other (See Comments)    Low potassium levels    Hydroxychloroquine Sulfate Other (See Comments)    Vision changes    Lyrica [Pregabalin] Other (See Comments)    Made me feel high   Tape Other (See Comments)    Medical tape causes bruising

## 2024-01-05 MED FILL — HUMIRA PEN CITRATE FREE 40 MG/0.4 ML: SUBCUTANEOUS | 28 days supply | Qty: 4 | Fill #3

## 2024-01-06 ENCOUNTER — Telehealth: Payer: Self-pay | Admitting: Internal Medicine

## 2024-01-06 NOTE — Telephone Encounter (Unsigned)
 Copied from CRM [id number]. Topic: Clinical - Refused Triage >> Jan 06, 2024  1:09 PM Ashley R wrote: Patient/caller voiced complaints of hi[p pain, medication note working. Declined transfer to triage. Going to ED, per previous triage   ----------------------------------------------------------------------- From previous Reason for Contact - Scheduling: Patient/patient representative is calling to schedule an appointment. Refer to attachments for appointment information.

## 2024-01-12 ENCOUNTER — Ambulatory Visit: Payer: Self-pay

## 2024-01-12 DIAGNOSIS — M25551 Pain in right hip: Secondary | ICD-10-CM

## 2024-01-12 NOTE — Telephone Encounter (Signed)
 Pt states that she fell a few weeks ago, was seen and given an rx for norco, states the pills are out and she is still having pain. Pt states that OTC medications are not helping. Pt states that she is having a difficult time getting around her home. Pt needing to book 3+ days out d/t transportation. Pt scheduled 1/2. Pt concerned that she will need MRI as pain has not changed since her fall.   Copied from CRM #[id number]. Topic: Clinical - Red Word Triage >> Jan 12, 2024  1:18 PM Hadassah PARAS wrote: Red Word that prompted transfer to Nurse Triage: Pt is experiening extreme pain on hip and back. OTC med are not wotking. Would like a refill on HYDROcodone -acetaminophen  (NORCO/VICODIN) 5-325 MG table. Pt also stated she has cataract surgery 12/30

## 2024-01-14 NOTE — Telephone Encounter (Signed)
 I am sorry about the ongoing pain in the right hip.  We can refer [name] to see an orthopedic surgeon.  Should I make a referral?  Thanks

## 2024-01-16 ENCOUNTER — Other Ambulatory Visit: Payer: Self-pay | Admitting: Internal Medicine

## 2024-01-16 ENCOUNTER — Ambulatory Visit: Admitting: Family Medicine

## 2024-01-16 DIAGNOSIS — M25551 Pain in right hip: Secondary | ICD-10-CM

## 2024-01-16 MED ORDER — HYDROCODONE-ACETAMINOPHEN 5-325 MG PO TABS: 1.0000 | ORAL_TABLET | Freq: Four times a day (QID) | ORAL | 0 refills | Status: AC | PRN

## 2024-01-16 NOTE — Progress Notes (Deleted)
" ° °  Acute Office Visit  Subjective:     Patient ID: [name], female    DOB: [birth year], 64 y.o.   MRN: [id number]  No chief complaint on file.   HPI  Discussed the use of AI scribe software for clinical note transcription with the patient, who gave verbal consent to proceed.  History of Present Illness      ROS Per HPI      Objective:    LMP 05/19/2003    Physical Exam Vitals and nursing note reviewed.  Constitutional:      General: She is not in acute distress.    Appearance: Normal appearance. She is normal weight.  HENT:     Head: Normocephalic and atraumatic.     Right Ear: External ear normal.     Left Ear: External ear normal.     Nose: Nose normal.     Mouth/Throat:     Mouth: Mucous membranes are moist.     Pharynx: Oropharynx is clear.  Eyes:     Extraocular Movements: Extraocular movements intact.     Pupils: Pupils are equal, round, and reactive to light.  Cardiovascular:     Rate and Rhythm: Normal rate and regular rhythm.     Pulses: Normal pulses.     Heart sounds: Normal heart sounds.  Pulmonary:     Effort: Pulmonary effort is normal. No respiratory distress.     Breath sounds: Normal breath sounds. No wheezing, rhonchi or rales.  Musculoskeletal:        General: Normal range of motion.     Cervical back: Normal range of motion.     Right lower leg: No edema.     Left lower leg: No edema.  Lymphadenopathy:     Cervical: No cervical adenopathy.  Neurological:     General: No focal deficit present.     Mental Status: She is alert and oriented to person, place, and time.  Psychiatric:        Mood and Affect: Mood normal.        Thought Content: Thought content normal.     No results found for any visits on 01/16/24.      Assessment & Plan:   Assessment and Plan Assessment & Plan      No orders of the defined types were placed in this encounter.    No orders of the defined types were placed in this  encounter.   No follow-ups on file.  Corean LITTIE Ku, FNP  "

## 2024-01-16 NOTE — Telephone Encounter (Signed)
 Done. Thank you.

## 2024-01-27 ENCOUNTER — Ambulatory Visit: Admitting: Endocrinology

## 2024-01-30 ENCOUNTER — Other Ambulatory Visit: Payer: Self-pay | Admitting: Internal Medicine

## 2024-02-03 ENCOUNTER — Other Ambulatory Visit: Payer: Self-pay | Admitting: Internal Medicine

## 2024-02-04 ENCOUNTER — Other Ambulatory Visit: Payer: Self-pay

## 2024-02-04 ENCOUNTER — Ambulatory Visit: Admitting: Orthopedic Surgery

## 2024-02-04 ENCOUNTER — Telehealth: Payer: Self-pay

## 2024-02-04 ENCOUNTER — Encounter: Payer: Self-pay | Admitting: Orthopedic Surgery

## 2024-02-04 DIAGNOSIS — M545 Low back pain, unspecified: Secondary | ICD-10-CM

## 2024-02-04 MED ORDER — BUDESONIDE-FORMOTEROL FUMARATE 80-4.5 MCG/ACT IN AERO: 2.0000 | INHALATION_SPRAY | Freq: Two times a day (BID) | RESPIRATORY_TRACT | 0 refills | Status: AC

## 2024-02-04 MED ORDER — HYDROCODONE-ACETAMINOPHEN 5-325 MG PO TABS: ORAL_TABLET | ORAL | 0 refills | Status: AC

## 2024-02-04 NOTE — Progress Notes (Signed)
 "  Office Visit Note   Patient: [name]           Date of Birth: 1960/09/17           MRN: [id number] Visit Date: 02/04/2024 Requested by: Garald Karlynn GAILS, MD [address] [place],  [place] [address] PCP: Garald Karlynn GAILS, MD  Subjective: Chief Complaint  Patient presents with   Lower Back - Pain    DOI: 12/22/23    HPI: [name] is a 64 y.o. female who presents to the office reporting low back pain and right sided radiculopathy.  Patient did have a fall 2012 825.  She has been ambulating with a rolling walker.  Does report some buttock pain but no definite radicular pain.  Hard for her to move fast enough to get to the bathroom.  Hard for her to lay on either side.  Denies any groin pain.  Hip radiographs send no fracture from mid December.  Norco is not helping but she would like to have some more.  Interval cataract surgery has delayed her evaluation here.  Coughing and sneezing is painful.  She is also on Xarelto .  Muscle laxer has not helped..                ROS: All systems reviewed are negative as they relate to the chief complaint within the history of present illness.  Patient denies fevers or chills.  Assessment & Plan: Visit Diagnoses:  1. Right-sided low back pain without sciatica, unspecified chronicity     Plan: Impression is low back pain with possible exacerbation of spinal stenosis.  No saddle paresthesias no definite weakness today.  She does have some mild nerve root tension signs on the right negative on the left.  Norco refilled.  Urgent MRI scan indicated to evaluate stenosis versus occult compression fracture following a fall.  Follow-up after that study.  Follow-Up Instructions: No follow-ups on file.   Orders:  Orders Placed This Encounter  Procedures   XR Lumbar Spine 2-3 Views   MR Lumbar Spine w/o contrast   Meds ordered this encounter  Medications   HYDROcodone -acetaminophen  (NORCO/VICODIN) 5-325 MG tablet     Sig: 1 po q 8hrs prn pain    Dispense:  40 tablet    Refill:  0      Procedures: No procedures performed   Clinical Data: No additional findings.  Objective: Vital Signs: LMP 05/19/2003   Physical Exam:  Constitutional: Patient appears well-developed HEENT:  Head: Normocephalic Eyes:EOM are normal Neck: Normal range of motion Cardiovascular: Normal rate Pulmonary/chest: Effort normal Neurologic: Patient is alert Skin: Skin is warm Psychiatric: Patient has normal mood and affect  Ortho Exam: Ortho exam demonstrates 5 out of 5 ankle dorsiflexion plantarflexion quad and hamstring strength.  No groin pain with internal or external rotation of either hip.  No definite paresthesias L1-S1 bilaterally.  Negative clonus.  No effusion in either knee.  Specialty Comments:  No specialty comments available.  Imaging: XR Lumbar Spine 2-3 Views Result Date: 02/04/2024 AP lateral radiographs lumbar spine reviewed.  Osteopenia present.  No compression fractures.  Significant degenerative disc disease is present along with facet arthritis in the lower lumbar spine.  No spondylolisthesis.    PMFS History: Patient Active Problem List   Diagnosis Date Noted   Right hip pain 01/01/2024   Hip injury, right, initial encounter 01/01/2024   Lichen planus 11/04/2023   Cellulitis of ear 08/04/2023   Bilateral conjunctivitis 07/29/2023  Hx of colonic polyps 07/15/2023   Eczema of external ear, left 06/04/2023   Long term (current) use of opiate analgesic 03/10/2023   Ankle sprain 10/29/2022   Accidental fall 10/29/2022   Rib pain on right side 08/26/2022   Fall against object 08/26/2022   Trigeminal neuralgia 04/09/2022   Vision loss, bilateral 03/12/2022   Red eyes 03/12/2022   Arthralgia of left temporomandibular joint 01/24/2022   Purpura, allergic 12/10/2021   Intertrigo 12/10/2021   Gait disorder 08/28/2021   Closed fracture of distal end of femur, unspecified fracture  morphology, unspecified laterality, initial encounter (HCC) 07/31/2021   Chronic pain disorder 03/23/2021   Lumbosacral spondylosis without myelopathy 12/13/2020   Dacrocystitis, left 11/20/2020   Well adult exam 07/19/2020   Chronic diarrhea    Shoulder pain, bilateral 02/01/2019   Physical deconditioning 01/28/2019   Chronic anticoagulation 11/19/2018   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) 11/19/2018   Oxygen  dependent 11/19/2018   Family history of colon cancer 10/26/2018   [place]a 06/12/2018   Mixed conductive and sensorineural hearing loss of left ear with restricted hearing of right ear 06/04/2018   Thrush, oral 01/15/2018   Dyslipidemia 10/28/2017   Pruritus 10/23/2017   Anemia 09/24/2017   Migraines 09/17/2017   Low back pain 12/17/2016   Diastolic dysfunction 11/11/2016   OSA (obstructive sleep apnea) 09/30/2016   Chronic respiratory failure with hypoxia (HCC) 08/23/2016   Pulmonary embolism (HCC) 07/21/2016   DVT (deep venous thrombosis) (HCC) 07/21/2016   Acute bronchitis 01/30/2016   Weight gain 12/20/2015   Controlled type 2 diabetes mellitus with chronic kidney disease, without long-term current use of insulin  (HCC) 02/01/2015   Abnormal SPEP 06/04/2013   Memory loss 04/27/2013   Panic attacks 03/17/2013   Rash 10/27/2012   CAD in native artery 05/28/2012   Seborrheic dermatitis 01/20/2012   Obesity, Class III, BMI 40-49.9 (morbid obesity) (HCC) 03/07/2010   INSOMNIA, CHRONIC 03/07/2010   Allergic rhinitis 10/26/2009   Grief reaction 08/01/2009   Essential hypertension 08/27/2007   B12 deficiency 08/25/2007   Vitamin D  deficiency 08/25/2007   Osteoarthritis 02/02/2007   Sarcoidosis 08/14/2006   Anxiety disorder 08/14/2006   Major depressive disorder, recurrent episode 08/14/2006   GERD (gastroesophageal reflux disease) 08/14/2006   Past Medical History:  Diagnosis Date   ALLERGIC RHINITIS 10/26/2009   no current problems   Anxiety    B12  DEFICIENCY 08/25/2007   takes B12 supplement   Complication of anesthesia    pt has had difficulty following anesthesia with her knee in 2016-unable to care for herself afterward   Depression    takes effexor  xr daily   Diabetes mellitus without complication (HCC)    was on insulin  but has been off since Nov 2015 and now only takes Metformin  daily   DYSPNEA 04/28/2009   uses oxygen  24/7, on 2L via [place]   Esophageal reflux    takes Nexium  daily   Fibromyalgia    Headache    last migraine 2-24yrs ago;takes Topamax  daily   History of shingles    Hypertension    takes Coreg  daily   Insomnia    takes Nortriptyline  nightly    Long-term memory loss    OSA (obstructive sleep apnea)    doesn't use CPAP;sleep study in epic from 2006   Osteoarthritis    Panic attacks    PONV (postoperative nausea and vomiting)    on time in 2016 knee surgery   Rheumatoid arthritis (HCC)    Sarcoidosis  Dr. Zeminski   Stroke [facility])    Vitamin D  deficiency    is supposed to take Vit D but can't afford it    Family History  Problem Relation Age of Onset   Heart disease Mother    Kidney disease Mother        renal failure   Cancer Father        leukemia   Hypertension Other    Coronary artery disease Other        female 1st degree relative <60   Heart failure Other        congestive   Coronary artery disease Other        Female 1st degree relative <50   Breast cancer Other        1st degree relative <50 S   Breast cancer Sister    Colon cancer Brother 41   Stroke Brother    Heart attack Brother    Cancer Brother 50       colon ca   Esophageal cancer Neg Hx    Stomach cancer Neg Hx    Rectal cancer Neg Hx     Past Surgical History:  Procedure Laterality Date   APPENDECTOMY     arthroscopic knee surgery Right 11-12-04   AXILLARY ABCESS IRRIGATION AND DEBRIDEMENT  Jul & [id number]   BIOPSY  11/02/2019   Procedure: BIOPSY;  Surgeon: Albertus Gordy HERO, MD;  Location: [facility];  Service:  Gastroenterology;;   ORIN MEDIATE RELEASE Left 05/23/2014   Procedure: CARPAL TUNNEL RELEASE;  Surgeon: Glendia Cordella Hutchinson, MD;  Location: [facility] OR;  Service: Orthopedics;  Laterality: Left;   CHOLECYSTECTOMY N/A 02/11/2018   Procedure: LAPAROSCOPIC CHOLECYSTECTOMY WITH INTRAOPERATIVE CHOLANGIOGRAM ERAS PATHWAY;  Surgeon: Belinda Cough, MD;  Location: [facility] OR;  Service: General;  Laterality: N/A;   COLONOSCOPY N/A 07/15/2023   Procedure: COLONOSCOPY;  Surgeon: Albertus Gordy HERO, MD;  Location: [facility];  Service: Gastroenterology;  Laterality: N/A;   COLONOSCOPY WITH PROPOFOL  N/A 11/02/2019   Procedure: COLONOSCOPY WITH PROPOFOL ;  Surgeon: Albertus Gordy HERO, MD;  Location: [facility];  Service: Gastroenterology;  Laterality: N/A;   cyst removed from top of buttocks  at age 33   DACRORHINOCYSTOTOMY Bilateral 03/23/2021   Procedure: ENDOSCOPIC DACROCYSTORHINOSTOMY;  Surgeon: Rodgers Faden, MD;  Location: [facility] OR;  Service: Ophthalmology;  Laterality: Bilateral;   ENDOMETRIAL ABLATION     IR CHOLANGIOGRAM EXISTING TUBE  11/21/2017   IR CHOLANGIOGRAM EXISTING TUBE  01/16/2018   IR EXCHANGE BILIARY DRAIN  11/10/2017   IR PATIENT EVAL TECH 0-60 MINS  02/03/2018   IR PERC CHOLECYSTOSTOMY  09/13/2017   IR RADIOLOGIST EVAL & MGMT  10/08/2017   LACRIMAL DUCT EXPLORATION Right 06/26/2017   Procedure: LACRIMAL DUCT EXPLORATION AND ETHMOIDECTOMY;  Surgeon: Laurie Loyd Redhead, MD;  Location: [facility] OR;  Service: Ophthalmology;  Laterality: Right;   LACRIMAL DUCT EXPLORATION Bilateral 03/23/2021   Procedure: NASAL LACRIMAL DUCT EXPLORATION;  Surgeon: Rodgers Faden, MD;  Location: [facility] OR;  Service: Ophthalmology;  Laterality: Bilateral;   LACRIMAL TUBE INSERTION Bilateral 03/23/2021   Procedure: INSERTION OF CRAWFORD LACRIMAL STENT;  Surgeon: Rodgers Faden, MD;  Location: [facility] OR;  Service: Ophthalmology;  Laterality: Bilateral;   ORIF FEMUR FRACTURE Left 07/31/2021   Procedure: left medial femoral condyle fracture fixation;   Surgeon: Hutchinson Cordella Glendia, MD;  Location: [facility] OR;  Service: Orthopedics;  Laterality: Left;   POLYPECTOMY  11/02/2019   Procedure: POLYPECTOMY;  Surgeon: Albertus Gordy HERO, MD;  Location: [facility];  Service: Gastroenterology;;   KATHLENE DUCT PROBING Right 06/26/2017   Procedure: TEAR DUCT PROBING WITH STENT;  Surgeon: Laurie Loyd Redhead, MD;  Location: [facility] OR;  Service: Ophthalmology;  Laterality: Right;   TOTAL KNEE ARTHROPLASTY Right 11/15/2014   Procedure: TOTAL RIGHT KNEE ARTHROPLASTY;  Surgeon: Glendia Cordella Hutchinson, MD;  Location: [facility] OR;  Service: Orthopedics;  Laterality: Right;   TOTAL KNEE ARTHROPLASTY Left 07/13/2015   Procedure: LEFT TOTAL KNEE ARTHROPLASTY;  Surgeon: Glendia Cordella Hutchinson, MD;  Location: [facility] OR;  Service: Orthopedics;  Laterality: Left;   Social History   Occupational History   Occupation: disabled  Tobacco Use   Smoking status: Former    Current packs/day: 0.00    Average packs/day: 0.5 packs/day for 16.0 years (8.0 ttl pk-yrs)    Types: Cigarettes    Start date: 47    Quit date: 2004    Years since quitting: 22.0   Smokeless tobacco: Never   Tobacco comments:    quit smoking in 2004  Vaping Use   Vaping status: Never Used  Substance and Sexual Activity   Alcohol  use: Not Currently    Alcohol /week: 1.0 standard drink of alcohol     Types: 1 Glasses of wine per week   Drug use: No   Sexual activity: Not Currently    Birth control/protection: Post-menopausal        "

## 2024-02-04 NOTE — Telephone Encounter (Signed)
 Received faxed refill request for Symbicort  from [facility]. Rx sent. NFN.

## 2024-02-06 ENCOUNTER — Encounter: Payer: Self-pay | Admitting: Orthopedic Surgery

## 2024-02-11 NOTE — Telephone Encounter (Signed)
 Received fax from [facility] stating pt was provided temporary supply of tacrolimus  ointment but will need prior auth for further fills.  Scanned to media tab in event that contact information needed to troubleshoot via phone.  Initiated epa and question set answered.  Awaiting determination.

## 2024-02-12 ENCOUNTER — Ambulatory Visit
Admission: RE | Admit: 2024-02-12 | Discharge: 2024-02-12 | Disposition: A | Discharge status: Home / Self Care | Source: Ambulatory Visit | Attending: Orthopedic Surgery | Admitting: Orthopedic Surgery

## 2024-02-12 DIAGNOSIS — M545 Low back pain, unspecified: Secondary | ICD-10-CM

## 2024-02-13 ENCOUNTER — Ambulatory Visit: Payer: Self-pay | Admitting: Orthopedic Surgery

## 2024-02-13 ENCOUNTER — Other Ambulatory Visit: Payer: Self-pay | Admitting: Internal Medicine

## 2024-02-13 NOTE — Progress Notes (Signed)
 Hi [name].  Looks like she has compression fracture at L3.  Does have some anterior and middle column involvement.  Not a great operative candidate because of blood thinners and other medical comorbidities.  Can you send her over to a medical supply place for a lumbar corset with rigid stays.  Thanks.  I did call and talk to her about that.  If that does not work then we may have to have her see MIC.

## 2024-02-17 ENCOUNTER — Encounter: Payer: Self-pay | Admitting: Orthopedic Surgery

## 2024-02-18 NOTE — Telephone Encounter (Signed)
 Hi [name] can you send her in a prescription for lumbar corset with rigid stays to a medical supply facility of her choosing thanks

## 2024-02-19 NOTE — Progress Notes (Signed)
 [facility] has been contacted in regards to their refill of adalimumab : HUMIRA (CF) PEN 40 mg/0.4 mL injection. At this time, they have declined refill due to patient having 2 doses remaining. Refill assessment call date has been updated per the patient's request.

## 2024-02-20 ENCOUNTER — Other Ambulatory Visit

## 2024-03-04 ENCOUNTER — Ambulatory Visit (HOSPITAL_BASED_OUTPATIENT_CLINIC_OR_DEPARTMENT_OTHER): Admitting: Pulmonary Disease

## 2024-04-30 ENCOUNTER — Ambulatory Visit
